# Patient Record
Sex: Female | Born: 1950
Health system: Southern US, Community
[De-identification: ages and names within clinical notes are randomized; demographics above are authoritative.]

## PROBLEM LIST (undated history)

## (undated) DIAGNOSIS — I4891 Unspecified atrial fibrillation: Secondary | ICD-10-CM

## (undated) DIAGNOSIS — R519 Headache, unspecified: Secondary | ICD-10-CM

## (undated) DIAGNOSIS — J449 Chronic obstructive pulmonary disease, unspecified: Secondary | ICD-10-CM

## (undated) DIAGNOSIS — G4730 Sleep apnea, unspecified: Secondary | ICD-10-CM

## (undated) DIAGNOSIS — I2699 Other pulmonary embolism without acute cor pulmonale: Secondary | ICD-10-CM

## (undated) DIAGNOSIS — G8929 Other chronic pain: Secondary | ICD-10-CM

## (undated) DIAGNOSIS — F419 Anxiety disorder, unspecified: Secondary | ICD-10-CM

## (undated) DIAGNOSIS — E119 Type 2 diabetes mellitus without complications: Secondary | ICD-10-CM

## (undated) DIAGNOSIS — I509 Heart failure, unspecified: Secondary | ICD-10-CM

## (undated) DIAGNOSIS — K219 Gastro-esophageal reflux disease without esophagitis: Secondary | ICD-10-CM

## (undated) DIAGNOSIS — M545 Low back pain, unspecified: Secondary | ICD-10-CM

## (undated) DIAGNOSIS — I10 Essential (primary) hypertension: Secondary | ICD-10-CM

## (undated) DIAGNOSIS — R51 Headache: Secondary | ICD-10-CM

## (undated) DIAGNOSIS — F32A Depression, unspecified: Secondary | ICD-10-CM

## (undated) DIAGNOSIS — M1990 Unspecified osteoarthritis, unspecified site: Secondary | ICD-10-CM

## (undated) DIAGNOSIS — L03116 Cellulitis of left lower limb: Secondary | ICD-10-CM

## (undated) DIAGNOSIS — I872 Venous insufficiency (chronic) (peripheral): Secondary | ICD-10-CM

## (undated) DIAGNOSIS — C50919 Malignant neoplasm of unspecified site of unspecified female breast: Secondary | ICD-10-CM

## (undated) DIAGNOSIS — F329 Major depressive disorder, single episode, unspecified: Secondary | ICD-10-CM

## (undated) DIAGNOSIS — I499 Cardiac arrhythmia, unspecified: Secondary | ICD-10-CM

## (undated) DIAGNOSIS — E785 Hyperlipidemia, unspecified: Secondary | ICD-10-CM

## (undated) DIAGNOSIS — Z86711 Personal history of pulmonary embolism: Secondary | ICD-10-CM

## (undated) DIAGNOSIS — N6002 Solitary cyst of left breast: Secondary | ICD-10-CM

## (undated) DIAGNOSIS — I82409 Acute embolism and thrombosis of unspecified deep veins of unspecified lower extremity: Secondary | ICD-10-CM

## (undated) HISTORY — DX: Acute embolism and thrombosis of unspecified deep veins of unspecified lower extremity: I82.409

## (undated) HISTORY — DX: Unspecified atrial fibrillation: I48.91

## (undated) HISTORY — PX: ABDOMINAL HYSTERECTOMY: SHX81

## (undated) HISTORY — PX: TUBAL LIGATION: SHX77

## (undated) HISTORY — PX: BREAST CYST EXCISION: SHX579

## (undated) HISTORY — PX: APPENDECTOMY: SHX54

## (undated) HISTORY — DX: Malignant neoplasm of unspecified site of unspecified female breast: C50.919

## (undated) HISTORY — DX: Other pulmonary embolism without acute cor pulmonale: I26.99

## (undated) HISTORY — PX: CHOLECYSTECTOMY: SHX55

## (undated) HISTORY — PX: BREAST LUMPECTOMY: SHX2

## (undated) HISTORY — DX: Solitary cyst of left breast: N60.02

## (undated) HISTORY — DX: Personal history of pulmonary embolism: Z86.711

## (undated) HISTORY — DX: Morbid (severe) obesity due to excess calories: E66.01

## (undated) HISTORY — PX: DILATION AND CURETTAGE OF UTERUS: SHX78

## (undated) HISTORY — PX: TONSILLECTOMY AND ADENOIDECTOMY: SUR1326

## (undated) HISTORY — PX: CARDIAC CATHETERIZATION: SHX172

---

## 1998-11-04 ENCOUNTER — Ambulatory Visit: Admission: RE | Admit: 1998-11-04 | Discharge: 1998-11-04 | Payer: Self-pay | Admitting: Occupational Medicine

## 1998-11-04 ENCOUNTER — Encounter: Payer: Self-pay | Admitting: Occupational Medicine

## 1998-12-02 ENCOUNTER — Encounter: Payer: Self-pay | Admitting: *Deleted

## 1998-12-02 ENCOUNTER — Ambulatory Visit: Admission: RE | Admit: 1998-12-02 | Discharge: 1998-12-02 | Payer: Self-pay | Admitting: *Deleted

## 1998-12-07 ENCOUNTER — Encounter: Admission: RE | Admit: 1998-12-07 | Discharge: 1998-12-22 | Payer: Self-pay | Admitting: *Deleted

## 1998-12-29 ENCOUNTER — Ambulatory Visit: Admission: RE | Admit: 1998-12-29 | Discharge: 1998-12-29 | Payer: Self-pay | Admitting: Occupational Medicine

## 1998-12-29 ENCOUNTER — Encounter: Payer: Self-pay | Admitting: Occupational Medicine

## 1999-01-06 ENCOUNTER — Ambulatory Visit (HOSPITAL_COMMUNITY): Admission: RE | Admit: 1999-01-06 | Discharge: 1999-01-06 | Payer: Self-pay | Admitting: Occupational Medicine

## 1999-01-06 ENCOUNTER — Encounter: Payer: Self-pay | Admitting: Occupational Medicine

## 1999-02-07 ENCOUNTER — Encounter: Admission: RE | Admit: 1999-02-07 | Discharge: 1999-02-18 | Payer: Self-pay | Admitting: Occupational Medicine

## 1999-10-13 ENCOUNTER — Emergency Department (HOSPITAL_COMMUNITY): Admission: EM | Admit: 1999-10-13 | Discharge: 1999-10-13 | Payer: Self-pay | Admitting: Emergency Medicine

## 1999-10-13 ENCOUNTER — Encounter: Payer: Self-pay | Admitting: Emergency Medicine

## 1999-10-14 ENCOUNTER — Encounter: Payer: Self-pay | Admitting: Emergency Medicine

## 2001-02-27 ENCOUNTER — Ambulatory Visit (HOSPITAL_COMMUNITY): Admission: RE | Admit: 2001-02-27 | Discharge: 2001-02-27 | Payer: Self-pay | Admitting: Family Medicine

## 2001-02-27 ENCOUNTER — Encounter: Payer: Self-pay | Admitting: Family Medicine

## 2013-11-24 ENCOUNTER — Emergency Department (HOSPITAL_COMMUNITY)
Admission: EM | Admit: 2013-11-24 | Discharge: 2013-11-24 | Disposition: A | Discharge status: Home / Self Care | Payer: Medicare Other | Attending: Emergency Medicine | Admitting: Emergency Medicine

## 2013-11-24 ENCOUNTER — Encounter (HOSPITAL_COMMUNITY): Payer: Self-pay | Admitting: Emergency Medicine

## 2013-11-24 DIAGNOSIS — F17200 Nicotine dependence, unspecified, uncomplicated: Secondary | ICD-10-CM | POA: Insufficient documentation

## 2013-11-24 DIAGNOSIS — M545 Low back pain, unspecified: Secondary | ICD-10-CM | POA: Insufficient documentation

## 2013-11-24 DIAGNOSIS — I10 Essential (primary) hypertension: Secondary | ICD-10-CM | POA: Insufficient documentation

## 2013-11-24 DIAGNOSIS — IMO0002 Reserved for concepts with insufficient information to code with codable children: Secondary | ICD-10-CM | POA: Diagnosis not present

## 2013-11-24 DIAGNOSIS — M5416 Radiculopathy, lumbar region: Secondary | ICD-10-CM

## 2013-11-24 DIAGNOSIS — M25559 Pain in unspecified hip: Secondary | ICD-10-CM | POA: Insufficient documentation

## 2013-11-24 HISTORY — DX: Essential (primary) hypertension: I10

## 2013-11-24 MED ORDER — DEXAMETHASONE 4 MG PO TABS: 4.0000 mg | ORAL_TABLET | Freq: Two times a day (BID) | ORAL | Status: DC

## 2013-11-24 MED ORDER — DIAZEPAM 5 MG PO TABS
5.0000 mg | ORAL_TABLET | Freq: Once | ORAL | Status: AC
  Administered 2013-11-24: 5 mg via ORAL
  Filled 2013-11-24: qty 1

## 2013-11-24 MED ORDER — HYDROMORPHONE HCL PF 1 MG/ML IJ SOLN
1.0000 mg | Freq: Once | INTRAMUSCULAR | Status: AC
  Administered 2013-11-24: 1 mg via INTRAMUSCULAR
  Filled 2013-11-24: qty 1

## 2013-11-24 MED ORDER — OXYCODONE-ACETAMINOPHEN 5-325 MG PO TABS: 1.0000 | ORAL_TABLET | ORAL | Status: DC | PRN

## 2013-11-24 MED ORDER — DEXAMETHASONE SODIUM PHOSPHATE 10 MG/ML IJ SOLN
10.0000 mg | Freq: Once | INTRAMUSCULAR | Status: AC
  Administered 2013-11-24: 10 mg via INTRAMUSCULAR
  Filled 2013-11-24: qty 1

## 2013-11-24 MED ORDER — KETOROLAC TROMETHAMINE 30 MG/ML IJ SOLN
30.0000 mg | Freq: Once | INTRAMUSCULAR | Status: AC
  Administered 2013-11-24: 30 mg via INTRAMUSCULAR
  Filled 2013-11-24: qty 1

## 2013-11-24 MED ORDER — DIAZEPAM 5 MG PO TABS: 2.5000 mg | ORAL_TABLET | Freq: Three times a day (TID) | ORAL | Status: DC | PRN

## 2013-11-24 NOTE — ED Notes (Addendum)
Pt c/o right leg pain, hip pain, lower back pain x 1 week. Pt states she needs a knee replacement. States pain is getting worse, denies injury

## 2013-11-24 NOTE — ED Notes (Signed)
Pt c/o limited movement, weakness, and swelling to RLE.  Reports that she needs a knee replacement, but her vascular MD will not release her.  Pt admits that she does not take her medications on a regular basis and will not take anything that insurance does not approve.  Denies injury.

## 2013-11-24 NOTE — Discharge Instructions (Signed)

## 2013-11-24 NOTE — ED Provider Notes (Signed)
CSN: [id number]     Arrival date & time 11/24/13  0745 History   First MD Initiated Contact with Patient 11/24/13 [phone]     Chief Complaint  Patient presents with  . Leg Pain    right  . Hip Pain  . Back Pain     (Consider location/radiation/quality/duration/timing/severity/associated sxs/prior Treatment) HPI  63 year old female with chief complaint of right lower back and right hip pain. Progressively worsening over the past 5 days. Denies any trauma. She cannot remember what she is doing specifically when she first noticed the pain. Pain is in the right lower back and radiates into her right buttocks, right thigh and down into the knee. Pain at rest which is slightly worsened with movement. No numbness or tingling. No fevers or chills. Chronic lower extremity swelling. Denies any acute change in this. No chest pain or shortness of breath. Patient has tried taking numerous medications both over-the-counter and prescription. She has tried oxycodone, NSAIDs and a lidocaine patch that her daughter gave her. She's had only mild relief with these treatments. Denies any history of any prior back or hip surgery. States she is in need of a knee replacement. Denies any IV drug use. Denies use of blood thinners. Is a smoker.  Past Medical History  Diagnosis Date  . Hypertension    Past Surgical History  Procedure Laterality Date  . Abdominal hysterectomy    . Tonsillectomy    . Appendectomy     No family history on file. History  Substance Use Topics  . Smoking status: Current Every Day Smoker  . Smokeless tobacco: Not on file  . Alcohol Use: No   OB History   Grav Para Term Preterm Abortions TAB SAB Ect Mult Living                 Review of Systems  All systems reviewed and negative, other than as noted in HPI.   Allergies  Review of patient's allergies indicates no known allergies.  Home Medications   Prior to Admission medications   Not on File   BP 170/96  Pulse 73   Temp(Src) 97.3 F (36.3 C) (Oral)  Resp 22  SpO2 96% Physical Exam  Nursing note and vitals reviewed. Constitutional: She appears well-developed and well-nourished. No distress.  Sitting up on the side of the bed. No acute distress. Morbidly obese.  HENT:  Head: Normocephalic and atraumatic.  Eyes: Conjunctivae are normal. Right eye exhibits no discharge. Left eye exhibits no discharge.  Neck: Neck supple.  Cardiovascular: Normal rate, regular rhythm and normal heart sounds.  Exam reveals no gallop and no friction rub.   No murmur heard. Pulmonary/Chest: Effort normal and breath sounds normal. No respiratory distress.  Abdominal: Soft. She exhibits no distension. There is no tenderness.  Musculoskeletal: She exhibits no edema and no tenderness.  Positive straight leg test. Tenderness with palpation along the right buttock and proximal, posterior right thigh. Sensation is intact to light touch. Increased pain with active range of motion the right hip, particularly flexion. The same movements passively with no apparent discomfort. Patient able to actively range her right knee with little apparent pain. She has a palpable DP pulse. Lower extremities are symmetric as compared to each other. No calf tenderness. Lower extremity edema with skin changes to bilateral shins consistent with stasis dermatitis. No midline spinal tenderness.  Neurological: She is alert.  Skin: Skin is warm and dry.  Psychiatric: She has a normal mood and affect. Her behavior  is normal. Thought content normal.    ED Course  Procedures (including critical care time) Labs Review Labs Reviewed - No data to display  Imaging Review No results found.   EKG Interpretation None      MDM   Final diagnoses:  Lumbar radiculopathy    63 year old female with lower back and right lower extremity pain. Suspect this is a back issue, likely lumbar radiculopathy. She is neurovascularly intact. Ranging the hip passively  elicits little pain. Same with her knee. Sensation is intact to light touch. She had a palpable DP pulse. She does have edematous with skin changes consistent with stasis dermatitis. She reports that this is chronic for her. Lytes appear to be symmetric although her body habitus does somewhat limit examination. No particularly concerning features of her symptoms or her exam. Imaging likely very low yield. Plan symptomatic treatment at this time.  Feeling much better. Walking around ED.   Virgel Manifold, MD 11/25/13 [phone]

## 2013-11-24 NOTE — ED Notes (Signed)
Pt reports that pain is relieved, except "when I push on it."  Pt asking to be discharged.  Will notify MD.

## 2014-09-16 DIAGNOSIS — M170 Bilateral primary osteoarthritis of knee: Secondary | ICD-10-CM | POA: Insufficient documentation

## 2014-09-21 DIAGNOSIS — I2699 Other pulmonary embolism without acute cor pulmonale: Secondary | ICD-10-CM

## 2014-09-21 DIAGNOSIS — I4891 Unspecified atrial fibrillation: Secondary | ICD-10-CM | POA: Insufficient documentation

## 2014-09-21 HISTORY — DX: Other pulmonary embolism without acute cor pulmonale: I26.99

## 2015-09-23 ENCOUNTER — Other Ambulatory Visit: Payer: Self-pay

## 2015-09-23 ENCOUNTER — Encounter (HOSPITAL_COMMUNITY): Payer: Self-pay

## 2015-09-23 ENCOUNTER — Emergency Department (HOSPITAL_COMMUNITY)
Admission: EM | Admit: 2015-09-23 | Discharge: 2015-09-23 | Disposition: A | Discharge status: Home / Self Care | Payer: Medicare Other | Attending: Emergency Medicine | Admitting: Emergency Medicine

## 2015-09-23 ENCOUNTER — Emergency Department (HOSPITAL_COMMUNITY): Payer: Medicare Other

## 2015-09-23 DIAGNOSIS — R0602 Shortness of breath: Secondary | ICD-10-CM

## 2015-09-23 DIAGNOSIS — F17200 Nicotine dependence, unspecified, uncomplicated: Secondary | ICD-10-CM | POA: Insufficient documentation

## 2015-09-23 DIAGNOSIS — M79605 Pain in left leg: Secondary | ICD-10-CM | POA: Diagnosis not present

## 2015-09-23 DIAGNOSIS — Z79899 Other long term (current) drug therapy: Secondary | ICD-10-CM | POA: Insufficient documentation

## 2015-09-23 DIAGNOSIS — I110 Hypertensive heart disease with heart failure: Secondary | ICD-10-CM | POA: Insufficient documentation

## 2015-09-23 DIAGNOSIS — J449 Chronic obstructive pulmonary disease, unspecified: Secondary | ICD-10-CM | POA: Insufficient documentation

## 2015-09-23 DIAGNOSIS — I509 Heart failure, unspecified: Secondary | ICD-10-CM | POA: Insufficient documentation

## 2015-09-23 DIAGNOSIS — Z86711 Personal history of pulmonary embolism: Secondary | ICD-10-CM

## 2015-09-23 DIAGNOSIS — Z8709 Personal history of other diseases of the respiratory system: Secondary | ICD-10-CM

## 2015-09-23 HISTORY — DX: Hyperlipidemia, unspecified: E78.5

## 2015-09-23 HISTORY — DX: Heart failure, unspecified: I50.9

## 2015-09-23 HISTORY — DX: Other pulmonary embolism without acute cor pulmonale: I26.99

## 2015-09-23 HISTORY — DX: Chronic obstructive pulmonary disease, unspecified: J44.9

## 2015-09-23 LAB — BASIC METABOLIC PANEL
Anion gap: 10 (ref 5–15)
BUN: 14 mg/dL (ref 6–20)
CO2: 22 mmol/L (ref 22–32)
Calcium: 9.4 mg/dL (ref 8.9–10.3)
Chloride: 105 mmol/L (ref 101–111)
Creatinine, Ser: 0.97 mg/dL (ref 0.44–1.00)
GFR calc Af Amer: >60 mL/min (ref >60)
GFR calc non Af Amer: 60 mL/min — ABNORMAL LOW (ref >60)
Glucose, Bld: 134 mg/dL — ABNORMAL HIGH (ref 65–99)
Potassium: 3.5 mmol/L (ref 3.5–5.1)
Sodium: 137 mmol/L (ref 135–145)

## 2015-09-23 LAB — CBC
HCT: 40.9 % (ref 36.0–46.0)
Hemoglobin: 14 g/dL (ref 12.0–15.0)
MCH: 28.4 pg (ref 26.0–34.0)
MCHC: 34.2 g/dL (ref 30.0–36.0)
MCV: 83 fL (ref 78.0–100.0)
Platelets: 307 10*3/uL (ref 150–400)
RBC: 4.93 MIL/uL (ref 3.87–5.11)
RDW: 13.3 % (ref 11.5–15.5)
WBC: 10.7 10*3/uL — ABNORMAL HIGH (ref 4.0–10.5)

## 2015-09-23 LAB — I-STAT TROPONIN, ED: Troponin i, poc: 0.02 ng/mL (ref 0.00–0.08)

## 2015-09-23 LAB — BRAIN NATRIURETIC PEPTIDE: B Natriuretic Peptide: 89.4 pg/mL (ref 0.0–100.0)

## 2015-09-23 MED ORDER — ALBUTEROL SULFATE (2.5 MG/3ML) 0.083% IN NEBU: 2.5000 mg | INHALATION_SOLUTION | RESPIRATORY_TRACT | Status: DC | PRN

## 2015-09-23 MED ORDER — IPRATROPIUM-ALBUTEROL 0.5-2.5 (3) MG/3ML IN SOLN
3.0000 mL | Freq: Once | RESPIRATORY_TRACT | Status: AC
  Administered 2015-09-23: 3 mL via RESPIRATORY_TRACT
  Filled 2015-09-23: qty 3

## 2015-09-23 MED ORDER — IOPAMIDOL (ISOVUE-370) INJECTION 76%
INTRAVENOUS | Status: AC
  Administered 2015-09-23: 100 mL
  Filled 2015-09-23: qty 100

## 2015-09-23 MED ORDER — SILVER SULFADIAZINE 1 % EX CREA: 1.0000 "application " | TOPICAL_CREAM | Freq: Every day | CUTANEOUS | Status: DC

## 2015-09-23 MED ORDER — FUROSEMIDE 10 MG/ML IJ SOLN
40.0000 mg | Freq: Once | INTRAMUSCULAR | Status: AC
  Administered 2015-09-23: 40 mg via INTRAVENOUS
  Filled 2015-09-23: qty 4

## 2015-09-23 MED ORDER — METHYLPREDNISOLONE SODIUM SUCC 125 MG IJ SOLR
125.0000 mg | Freq: Once | INTRAMUSCULAR | Status: AC
  Administered 2015-09-23: 125 mg via INTRAVENOUS
  Filled 2015-09-23: qty 2

## 2015-09-23 NOTE — Discharge Instructions (Signed)
Continue your medications as before.  Return to the emergency department if you develop worsening breathing, chest pain, or other new and concerning symptoms.   Shortness of Breath Shortness of breath means you have trouble breathing. It could also mean that you have a medical problem. You should get immediate medical care for shortness of breath. CAUSES   Not enough oxygen in the air such as with high altitudes or a smoke-filled room.  Certain lung diseases, infections, or problems.  Heart disease or conditions, such as angina or heart failure.  Low red blood cells (anemia).  Poor physical fitness, which can cause shortness of breath when you exercise.  Chest or back injuries or stiffness.  Being overweight.  Smoking.  Anxiety, which can make you feel like you are not getting enough air. DIAGNOSIS  Serious medical problems can often be found during your physical exam. Tests may also be done to determine why you are having shortness of breath. Tests may include:  Chest X-rays.  Lung function tests.  Blood tests.  An electrocardiogram (ECG).  An ambulatory electrocardiogram. An ambulatory ECG records your heartbeat patterns over a 24-hour period.  Exercise testing.  A transthoracic echocardiogram (TTE). During echocardiography, sound waves are used to evaluate how blood flows through your heart.  A transesophageal echocardiogram (TEE).  Imaging scans. Your health care provider may not be able to find a cause for your shortness of breath after your exam. In this case, it is important to have a follow-up exam with your health care provider as directed.  TREATMENT  Treatment for shortness of breath depends on the cause of your symptoms and can vary greatly. HOME CARE INSTRUCTIONS   Do not smoke. Smoking is a common cause of shortness of breath. If you smoke, ask for help to quit.  Avoid being around chemicals or things that may bother your breathing, such as paint  fumes and dust.  Rest as needed. Slowly resume your usual activities.  If medicines were prescribed, take them as directed for the full length of time directed. This includes oxygen and any inhaled medicines.  Keep all follow-up appointments as directed by your health care provider. SEEK MEDICAL CARE IF:   Your condition does not improve in the time expected.  You have a hard time doing your normal activities even with rest.  You have any new symptoms. SEEK IMMEDIATE MEDICAL CARE IF:   Your shortness of breath gets worse.  You feel light-headed, faint, or develop a cough not controlled with medicines.  You start coughing up blood.  You have pain with breathing.  You have chest pain or pain in your arms, shoulders, or abdomen.  You have a fever.  You are unable to walk up stairs or exercise the way you normally do. MAKE SURE YOU:  Understand these instructions.  Will watch your condition.  Will get help right away if you are not doing well or get worse.   This information is not intended to replace advice given to you by your health care provider. Make sure you discuss any questions you have with your health care provider.   Document Released: 12/13/2000 Document Revised: 03/25/2013 Document Reviewed: 06/05/2011 Elsevier Interactive Patient Education [employer].

## 2015-09-23 NOTE — ED Notes (Signed)
Pt arrives EMS with c/o shob started at 0400. No relief with inhaler this am. Duoneb in route and 125 mg iv solumedrol by EMS. C/o left leg pain with bilateral legs and abdomin swollen.Weeping edema at left leg.

## 2015-09-23 NOTE — ED Provider Notes (Signed)
CSN: [id number]     Arrival date & time 09/23/15  1023 History   First MD Initiated Contact with Patient 09/23/15 1049     Chief Complaint  Patient presents with  . Shortness of Breath     (Consider location/radiation/quality/duration/timing/severity/associated sxs/prior Treatment) HPI Comments: Patient is a 65 year old female with extensive past medical history including congestive heart failure, COPD, peripheral vascular disease, and pulmonary embolism. She presents for evaluation of shortness of breath. This started yesterday and is worsening. She denies any fevers or chills. She denies any chest pain. She does report increased swelling of her legs. She states that this feels like what she experienced with her primary pulmonary embolism, however the patient is taking Xarelto and takes this every day.  This patient is here visiting family from [place]. This is where her medical care has been provided and she has no local physician. According to the daughter, the patient will likely not return to [place] due to her worsening health condition. She is hoping she can obtain a primary doctor here.  Patient is a 66 y.o. female presenting with shortness of breath. The history is provided by the patient.  Shortness of Breath Severity:  Moderate Onset quality:  Gradual Duration:  1 day Timing:  Constant Progression:  Worsening Chronicity:  Recurrent Context: activity   Relieved by:  Nothing Worsened by:  Nothing tried Ineffective treatments:  None tried Associated symptoms: no chest pain and no fever     Past Medical History  Diagnosis Date  . Hypertension   . CHF (congestive heart failure) ([place])   . COPD (chronic obstructive pulmonary disease) ([place])   . Hyperlipidemia   . PE (pulmonary embolism)    Past Surgical History  Procedure Laterality Date  . Abdominal hysterectomy    . Tonsillectomy    . Appendectomy     History reviewed. No pertinent family history. Social History   Substance Use Topics  . Smoking status: Current Every Day Smoker  . Smokeless tobacco: None  . Alcohol Use: No   OB History    No data available     Review of Systems  Constitutional: Negative for fever.  Respiratory: Positive for shortness of breath.   Cardiovascular: Negative for chest pain.  All other systems reviewed and are negative.     Allergies  Review of patient's allergies indicates no known allergies.  Home Medications   Prior to Admission medications   Medication Sig Start Date End Date Taking? Authorizing Provider  dexamethasone (DECADRON) 4 MG tablet Take 1 tablet (4 mg total) by mouth 2 (two) times daily. 11/24/13   Virgel Manifold, MD  diazepam (VALIUM) 5 MG tablet Take 0.5 tablets (2.5 mg total) by mouth every 8 (eight) hours as needed for muscle spasms. 11/24/13   Virgel Manifold, MD  esomeprazole (NEXIUM) 40 MG capsule Take 40 mg by mouth daily at 12 noon.    Historical Provider, MD  magnesium oxide (MAG-OX) 400 MG tablet Take 400 mg by mouth daily.    Historical Provider, MD  oxyCODONE-acetaminophen (PERCOCET/ROXICET) 5-325 MG per tablet Take 1-2 tablets by mouth every 4 (four) hours as needed for moderate pain or severe pain. 11/24/13   Virgel Manifold, MD   BP 137/83 mmHg  Pulse 109  Resp 20  SpO2 97% Physical Exam  Constitutional: She is oriented to person, place, and time. She appears well-developed and well-nourished. No distress.  HENT:  Head: Normocephalic and atraumatic.  Neck: Normal range of motion. Neck supple.  Cardiovascular: Normal rate and regular rhythm.  Exam reveals no gallop and no friction rub.   No murmur heard. Pulmonary/Chest: Effort normal and breath sounds normal. No respiratory distress. She has no wheezes.  Abdominal: Soft. Bowel sounds are normal. She exhibits no distension. There is no tenderness.  Musculoskeletal: Normal range of motion. She exhibits edema.  There is 2-3+ pitting edema of both lower extremities.  Neurological:  She is alert and oriented to person, place, and time.  Skin: Skin is warm and dry. She is not diaphoretic.  Nursing note and vitals reviewed.   ED Course  Procedures (including critical care time) Labs Review Labs Reviewed  BASIC METABOLIC PANEL  CBC  BRAIN NATRIURETIC PEPTIDE  I-STAT [place], ED  I-STAT TROPOININ, ED    Imaging Review No results found. I have personally reviewed and evaluated these images and lab results as part of my medical decision-making.   EKG Interpretation   Date/Time:  Thursday September 23 2015 10:34:45 EDT Ventricular Rate:  113 PR Interval:    QRS Duration: 102 QT Interval:  352 QTC Calculation: 483 R Axis:   -43 Text Interpretation:  Sinus tachycardia Left ventricular hypertrophy  Anterior Q waves, possibly due to LVH Confirmed by Taige Housman  MD, Lourdes Manning  [id number]) on 09/23/2015 11:05:12 AM      MDM   Final diagnoses:  None    Patient is a 65 year old female with history of CHF, COPD, pulmonary embolism, and morbid obesity. She has most of her care through her doctors in [place]. She is here now with family. She initially presented with complaints of shortness of breath and leg pain. While in the emergency department she was given Lasix with a good diuresis. Her legs are now feeling markedly better and she reports breathing much better. Her oxygen saturations are 98% and she is in no respiratory distress.  I see no evidence for pneumonia, congestive heart failure, or an acute cardiac event on her workup. She tells me this is how she felt when she had a blood clot in the past. A chest CT was negative for pulmonary embolism. I see nothing that requires admission. She will be discharged with continuing her current medications. She will also need to find a local doctor here to follow-up with if she plans to be in the area for an extended period of time.    Veryl Speak, MD 09/23/15 1600

## 2015-09-23 NOTE — ED Notes (Signed)
Pt stable, ambulatory, states understanding of discharge instructions 

## 2015-12-09 ENCOUNTER — Emergency Department (HOSPITAL_COMMUNITY): Payer: Medicare Other

## 2015-12-09 ENCOUNTER — Observation Stay (HOSPITAL_COMMUNITY)
Admission: EM | Admit: 2015-12-09 | Discharge: 2015-12-10 | Disposition: A | Discharge status: Home / Self Care | Payer: Medicare Other | Attending: Internal Medicine | Admitting: Internal Medicine

## 2015-12-09 ENCOUNTER — Encounter (HOSPITAL_COMMUNITY): Payer: Self-pay | Admitting: Emergency Medicine

## 2015-12-09 DIAGNOSIS — I110 Hypertensive heart disease with heart failure: Secondary | ICD-10-CM | POA: Insufficient documentation

## 2015-12-09 DIAGNOSIS — I739 Peripheral vascular disease, unspecified: Secondary | ICD-10-CM | POA: Diagnosis not present

## 2015-12-09 DIAGNOSIS — J449 Chronic obstructive pulmonary disease, unspecified: Secondary | ICD-10-CM | POA: Diagnosis not present

## 2015-12-09 DIAGNOSIS — E785 Hyperlipidemia, unspecified: Secondary | ICD-10-CM | POA: Insufficient documentation

## 2015-12-09 DIAGNOSIS — J988 Other specified respiratory disorders: Secondary | ICD-10-CM | POA: Diagnosis present

## 2015-12-09 DIAGNOSIS — R490 Dysphonia: Secondary | ICD-10-CM | POA: Diagnosis not present

## 2015-12-09 DIAGNOSIS — I10 Essential (primary) hypertension: Secondary | ICD-10-CM

## 2015-12-09 DIAGNOSIS — F17200 Nicotine dependence, unspecified, uncomplicated: Secondary | ICD-10-CM | POA: Insufficient documentation

## 2015-12-09 DIAGNOSIS — J384 Edema of larynx: Secondary | ICD-10-CM | POA: Diagnosis not present

## 2015-12-09 DIAGNOSIS — Z7982 Long term (current) use of aspirin: Secondary | ICD-10-CM | POA: Insufficient documentation

## 2015-12-09 DIAGNOSIS — I5032 Chronic diastolic (congestive) heart failure: Secondary | ICD-10-CM | POA: Insufficient documentation

## 2015-12-09 DIAGNOSIS — Z86711 Personal history of pulmonary embolism: Secondary | ICD-10-CM | POA: Diagnosis not present

## 2015-12-09 DIAGNOSIS — Z7901 Long term (current) use of anticoagulants: Secondary | ICD-10-CM | POA: Diagnosis not present

## 2015-12-09 DIAGNOSIS — T17308A Unspecified foreign body in larynx causing other injury, initial encounter: Secondary | ICD-10-CM

## 2015-12-09 LAB — CBC WITH DIFFERENTIAL/PLATELET
Basophils Absolute: 0 10*3/uL (ref 0.0–0.1)
Basophils Relative: 0 %
Eosinophils Absolute: 0.1 10*3/uL (ref 0.0–0.7)
Eosinophils Relative: 2 %
HCT: 38.5 % (ref 36.0–46.0)
Hemoglobin: 12.9 g/dL (ref 12.0–15.0)
Lymphocytes Relative: 34 %
Lymphs Abs: 2.3 10*3/uL (ref 0.7–4.0)
MCH: 29.1 pg (ref 26.0–34.0)
MCHC: 33.5 g/dL (ref 30.0–36.0)
MCV: 86.7 fL (ref 78.0–100.0)
Monocytes Absolute: 0.5 10*3/uL (ref 0.1–1.0)
Monocytes Relative: 8 %
Neutro Abs: 3.9 10*3/uL (ref 1.7–7.7)
Neutrophils Relative %: 56 %
Platelets: 335 10*3/uL (ref 150–400)
RBC: 4.44 MIL/uL (ref 3.87–5.11)
RDW: 14.1 % (ref 11.5–15.5)
WBC: 6.8 10*3/uL (ref 4.0–10.5)

## 2015-12-09 LAB — COMPREHENSIVE METABOLIC PANEL
ALT: 12 U/L — ABNORMAL LOW (ref 14–54)
AST: 15 U/L (ref 15–41)
Albumin: 3.6 g/dL (ref 3.5–5.0)
Alkaline Phosphatase: 80 U/L (ref 38–126)
Anion gap: 8 (ref 5–15)
BUN: 11 mg/dL (ref 6–20)
CO2: 22 mmol/L (ref 22–32)
Calcium: 9.3 mg/dL (ref 8.9–10.3)
Chloride: 110 mmol/L (ref 101–111)
Creatinine, Ser: 0.78 mg/dL (ref 0.44–1.00)
GFR calc Af Amer: >60 mL/min (ref >60)
GFR calc non Af Amer: >60 mL/min (ref >60)
Glucose, Bld: 95 mg/dL (ref 65–99)
Potassium: 3.6 mmol/L (ref 3.5–5.1)
Sodium: 140 mmol/L (ref 135–145)
Total Bilirubin: 0.5 mg/dL (ref 0.3–1.2)
Total Protein: 8 g/dL (ref 6.5–8.1)

## 2015-12-09 MED ORDER — POTASSIUM CHLORIDE CRYS ER 10 MEQ PO TBCR
10.0000 meq | EXTENDED_RELEASE_TABLET | Freq: Two times a day (BID) | ORAL | Status: DC
  Administered 2015-12-10: 10 meq via ORAL
  Filled 2015-12-09 (×2): qty 1
  Filled 2015-12-09: qty 0.5

## 2015-12-09 MED ORDER — NAPROXEN 500 MG PO TABS
500.0000 mg | ORAL_TABLET | Freq: Two times a day (BID) | ORAL | Status: DC | PRN
  Filled 2015-12-09: qty 1

## 2015-12-09 MED ORDER — TIOTROPIUM BROMIDE MONOHYDRATE 18 MCG IN CAPS
18.0000 ug | ORAL_CAPSULE | Freq: Every day | RESPIRATORY_TRACT | Status: DC
  Administered 2015-12-10: 18 ug via RESPIRATORY_TRACT
  Filled 2015-12-09: qty 5

## 2015-12-09 MED ORDER — GABAPENTIN 400 MG PO CAPS
400.0000 mg | ORAL_CAPSULE | Freq: Every day | ORAL | Status: DC | PRN
  Filled 2015-12-09: qty 2

## 2015-12-09 MED ORDER — CARVEDILOL 25 MG PO TABS
25.0000 mg | ORAL_TABLET | Freq: Two times a day (BID) | ORAL | Status: DC | PRN
  Administered 2015-12-10: 25 mg via ORAL
  Filled 2015-12-09: qty 2
  Filled 2015-12-09: qty 1

## 2015-12-09 MED ORDER — BUTAMBEN-TETRACAINE-BENZOCAINE 2-2-14 % EX AERO
1.0000 | INHALATION_SPRAY | Freq: Once | CUTANEOUS | Status: DC
  Filled 2015-12-09: qty 20

## 2015-12-09 MED ORDER — DEXAMETHASONE SODIUM PHOSPHATE 10 MG/ML IJ SOLN
10.0000 mg | Freq: Once | INTRAMUSCULAR | Status: AC
  Administered 2015-12-09: 10 mg via INTRAVENOUS
  Filled 2015-12-09: qty 1

## 2015-12-09 MED ORDER — BENZOCAINE 20 % MT AERO
INHALATION_SPRAY | Freq: Once | OROMUCOSAL | Status: DC
  Filled 2015-12-09: qty 57

## 2015-12-09 MED ORDER — ALBUTEROL SULFATE (2.5 MG/3ML) 0.083% IN NEBU
2.5000 mg | INHALATION_SOLUTION | RESPIRATORY_TRACT | Status: DC | PRN
  Administered 2015-12-10: 2.5 mg via RESPIRATORY_TRACT
  Filled 2015-12-09: qty 3

## 2015-12-09 MED ORDER — RIVAROXABAN 20 MG PO TABS: 20.0000 mg | ORAL_TABLET | ORAL | Status: DC

## 2015-12-09 MED ORDER — PANTOPRAZOLE SODIUM 40 MG PO TBEC: 80.0000 mg | DELAYED_RELEASE_TABLET | Freq: Every day | ORAL | Status: DC

## 2015-12-09 MED ORDER — PRAVASTATIN SODIUM 20 MG PO TABS
20.0000 mg | ORAL_TABLET | Freq: Every day | ORAL | Status: DC
  Administered 2015-12-10: 20 mg via ORAL
  Filled 2015-12-09: qty 1

## 2015-12-09 MED ORDER — LIDOCAINE HCL 2 % EX GEL
1.0000 "application " | Freq: Once | CUTANEOUS | Status: DC
  Filled 2015-12-09: qty 20

## 2015-12-09 MED ORDER — ASPIRIN EC 81 MG PO TBEC
81.0000 mg | DELAYED_RELEASE_TABLET | Freq: Every day | ORAL | Status: DC
  Administered 2015-12-10: 81 mg via ORAL
  Filled 2015-12-09: qty 1

## 2015-12-09 MED ORDER — TORSEMIDE 20 MG PO TABS
40.0000 mg | ORAL_TABLET | Freq: Every day | ORAL | Status: DC
  Administered 2015-12-10: 40 mg via ORAL
  Filled 2015-12-09: qty 2

## 2015-12-09 MED ORDER — AMLODIPINE BESY-BENAZEPRIL HCL 10-40 MG PO CAPS: 1.0000 | ORAL_CAPSULE | Freq: Every day | ORAL | Status: DC | PRN

## 2015-12-09 MED ORDER — NORTRIPTYLINE HCL 25 MG PO CAPS
50.0000 mg | ORAL_CAPSULE | Freq: Every evening | ORAL | Status: DC | PRN
  Filled 2015-12-09: qty 2

## 2015-12-09 MED ORDER — CELECOXIB 200 MG PO CAPS
200.0000 mg | ORAL_CAPSULE | Freq: Two times a day (BID) | ORAL | Status: DC | PRN
  Filled 2015-12-09: qty 1

## 2015-12-09 MED ORDER — OXYMETAZOLINE HCL 0.05 % NA SOLN
1.0000 | Freq: Once | NASAL | Status: AC
  Administered 2015-12-09: 1 via NASAL
  Filled 2015-12-09: qty 15

## 2015-12-09 NOTE — ED Triage Notes (Signed)
Pt has raspy voice and reports she is unable to speak well and feels like she cannot breath due to popcorn being stuck in her airway.

## 2015-12-09 NOTE — ED Provider Notes (Addendum)
[facility] DEPT Provider Note   CSN: [id number] Arrival date & time: 12/09/15  1905     History   Chief Complaint Chief Complaint  Patient presents with  . Airway Obstruction    HPI Sharon Vasquez is a 65 y.o. female.  HPI 65 year old female with past medical history of hypertension, hyperlipidemia, CHF, and COPD who presents with hoarseness and difficulty breathing. The patient states that she was eating popcorn and/or chicken last night when she began coughing. She felt something "go down the wrong way" and felt short of breath. She coughed and her sx then improved. Since then, she has had worsening hoarseness and intermittent difficulty breathing. Her throat feels "scratchy" and she is having difficulty breathing intermittently. No coughing or hemoptysis.  Past Medical History:  Diagnosis Date  . CHF (congestive heart failure) ([place])   . COPD (chronic obstructive pulmonary disease) ([place])   . Hyperlipidemia   . Hypertension   . PE (pulmonary embolism)     Patient Active Problem List   Diagnosis Date Noted  . Hoarseness 12/09/2015    Past Surgical History:  Procedure Laterality Date  . ABDOMINAL HYSTERECTOMY    . APPENDECTOMY    . TONSILLECTOMY      OB History    No data available       Home Medications    Prior to Admission medications   Medication Sig Start Date End Date Taking? Authorizing Provider  albuterol (PROVENTIL) (2.5 MG/3ML) 0.083% nebulizer solution Take 3 mLs (2.5 mg total) by nebulization every 4 (four) hours as needed for wheezing or shortness of breath. 09/23/15  Yes Veryl Speak, MD  amLODipine-benazepril (LOTREL) 10-40 MG capsule Take 1 capsule by mouth daily as needed (takes occassionally).    Yes Historical Provider, MD  aspirin EC 81 MG tablet Take 81 mg by mouth daily.   Yes Historical Provider, MD  carvedilol (COREG) 25 MG tablet Take 25 mg by mouth 2 (two) times daily as needed (for BP).    Yes Historical Provider, MD  celecoxib  (CELEBREX) 200 MG capsule Take 200 mg by mouth 2 (two) times daily as needed (inflammation).   Yes Historical Provider, MD  esomeprazole (NEXIUM) 40 MG capsule Take 40 mg by mouth daily as needed (for heartburn or indigestion).   Yes Historical Provider, MD  gabapentin (NEURONTIN) 400 MG capsule Take 400-800 mg by mouth daily.    Yes Historical Provider, MD  naproxen (NAPROSYN) 500 MG tablet Take 500 mg by mouth 2 (two) times daily as needed (pain).   Yes Historical Provider, MD  nortriptyline (PAMELOR) 25 MG capsule Take 50 mg by mouth at bedtime as needed for sleep.    Yes Historical Provider, MD  potassium chloride (K-DUR,KLOR-CON) 10 MEQ tablet Take 10 mEq by mouth 2 (two) times daily.   Yes Historical Provider, MD  pravastatin (PRAVACHOL) 20 MG tablet Take 20 mg by mouth daily.   Yes Historical Provider, MD  rivaroxaban (XARELTO) 20 MG TABS tablet Take 20 mg by mouth every morning.    Yes Historical Provider, MD  silver sulfADIAZINE (SILVADENE) 1 % cream Apply 1 application topically daily. Patient taking differently: Apply 1 application topically daily as needed (for legs).  09/23/15  Yes Veryl Speak, MD  tiotropium (SPIRIVA) 18 MCG inhalation capsule Place 18 mcg into inhaler and inhale daily.   Yes Historical Provider, MD  torsemide (DEMADEX) 20 MG tablet Take 40 mg by mouth daily.    Yes Historical Provider, MD    Family History  History reviewed. No pertinent family history.  Social History Social History  Substance Use Topics  . Smoking status: Current Every Day Smoker  . Smokeless tobacco: Never Used  . Alcohol use No     Allergies   Review of patient's allergies indicates no known allergies.   Review of Systems Review of Systems  Constitutional: Negative for chills and fever.  HENT: Positive for sore throat and voice change. Negative for congestion, rhinorrhea and trouble swallowing.   Eyes: Negative for visual disturbance.  Respiratory: Negative for cough, shortness of  breath and wheezing.   Cardiovascular: Negative for chest pain and leg swelling.  Gastrointestinal: Negative for abdominal pain, diarrhea, nausea and vomiting.  Genitourinary: Negative for dysuria, flank pain, vaginal bleeding and vaginal discharge.  Musculoskeletal: Negative for neck pain and neck stiffness.  Skin: Negative for rash.  Allergic/Immunologic: Negative for immunocompromised state.  Neurological: Negative for syncope and headaches.  Hematological: Does not bruise/bleed easily.  All other systems reviewed and are negative.    Physical Exam Updated Vital Signs BP 181/95   Pulse 65   Temp 98.3 F (36.8 C) (Oral)   Resp (!) 27   SpO2 96%   Physical Exam  Constitutional: She is oriented to person, place, and time. She appears well-developed and well-nourished. No distress.  HENT:  Head: Normocephalic and atraumatic.  No lingual or lip edema. Visualized OP is patent, non-erythematous.  Eyes: Conjunctivae are normal.  Neck: Normal range of motion. Neck supple.  Minimal inspiratory stridor. No expiratory stridor. Good aeration. Hoarseness.  Cardiovascular: Normal rate, regular rhythm and normal heart sounds.  Exam reveals no friction rub.   No murmur heard. Pulmonary/Chest: Effort normal and breath sounds normal. No respiratory distress. She has no wheezes. She has no rales.  Abdominal: She exhibits no distension.  Musculoskeletal: She exhibits no edema.  Neurological: She is alert and oriented to person, place, and time. She exhibits normal muscle tone.  Skin: Skin is warm. Capillary refill takes less than 2 seconds.  Psychiatric: She has a normal mood and affect.  Nursing note and vitals reviewed.    ED Treatments / Results  Labs (all labs ordered are listed, but only abnormal results are displayed) Labs Reviewed  COMPREHENSIVE METABOLIC PANEL - Abnormal; Notable for the following:       Result Value   ALT 12 (*)    All other components within normal limits    CBC WITH DIFFERENTIAL/PLATELET    EKG  EKG Interpretation None       Radiology Dg Neck Soft Tissue  Result Date: 12/09/2015 CLINICAL DATA:  Raspy voice, unable to speak well, feels like she cannot breathe, thinks popcorn stuck in her airway since last night question foreign body; history hypertension, CHF, COPD, smoker, pulmonary embolism EXAM: NECK SOFT TISSUES - 1+ VIEW COMPARISON:  None FINDINGS: Prevertebral soft tissues normal thickness. Epiglottis and aryepiglottic folds normal thickness. Scattered laryngeal calcifications. No definite radiopaque foreign bodies identified. Multilevel endplate spur formation thoracic spine with disc space narrowing. Anterior margin of trachea at the C7 level is not well visualized due to superimposed osseous structures. IMPRESSION: No definite acute soft tissue abnormalities. Degenerative disc disease changes cervical spine Electronically Signed   By: Lavonia Dana M.D.   On: 12/09/2015 20:31   Dg Chest 2 View  Result Date: 12/09/2015 CLINICAL DATA:  Raspy voice, unable to speak well, feels like she cannot breathe, thinks popcorn is stuck in her airway since last night question foreign body ; history hypertension,  CHF, COPD, smoker, pulmonary embolism EXAM: CHEST  2 VIEW COMPARISON:  09/23/2015 FINDINGS: Enlargement of cardiac silhouette. Mediastinal contours and pulmonary vascularity normal. Lungs grossly clear. No pleural effusion or pneumothorax. Bones unremarkable. IMPRESSION: No acute abnormalities. Electronically Signed   By: Lavonia Dana M.D.   On: 12/09/2015 20:28    Procedures .Critical Care Performed by: Duffy Bruce Authorized by: Duffy Bruce   Critical care provider statement:    Critical care time (minutes):  35   Critical care time was exclusive of:  Separately billable procedures and treating other patients   Critical care was necessary to treat or prevent imminent or life-threatening deterioration of the following conditions:   Respiratory failure   Critical care was time spent personally by me on the following activities:  Development of treatment plan with patient or surrogate, discussions with consultants, ordering and performing treatments and interventions, ordering and review of laboratory studies, ordering and review of radiographic studies, pulse oximetry, re-evaluation of patient's condition, review of old charts, obtaining history from patient or surrogate, evaluation of patient's response to treatment and examination of patient   I assumed direction of critical care for this patient from another provider in my specialty: no      (including critical care time)  Transnasal Flexible Fiberoptic Laryngoscopy: Patient was anesthetized topically in the right nares with lidocaine jelly, oxymetazoline/lidocaine mixture, then the oropharynx was anesthetized with benzocaine. A flexible transnasal scope was then introduced atraumatically into the nares. The nares were widely patent with no edema. The visualized epiglottis was non-edematous. There was no visible foreign body. There was moderate edema of false cords but true cords non-edematous with normal movement, no subglotic stenosis or significant edema. No FB or swelling identified in piriform sinuses. Of note, there does appear to be superficial excoriation/ulceration to epiglottis c/w possible minor aspiration trauma.  Medications Ordered in ED Medications  lidocaine (XYLOCAINE) 2 % jelly 1 application ( Topical Canceled Entry 12/09/15 2355)  butamben-tetracaine-benzocaine (CETACAINE) spray 1 spray (1 spray Topical Not Given 12/09/15 2236)  celecoxib (CELEBREX) capsule 200 mg (not administered)  amLODipine-benazepril (LOTREL) 10-40 MG per capsule 1 capsule (not administered)  albuterol (PROVENTIL) (2.5 MG/3ML) 0.083% nebulizer solution 2.5 mg (not administered)  aspirin EC tablet 81 mg (not administered)  carvedilol (COREG) tablet 25 mg (not administered)  gabapentin  (NEURONTIN) capsule 400-800 mg (not administered)  naproxen (NAPROSYN) tablet 500 mg (not administered)  pantoprazole (PROTONIX) EC tablet 80 mg (not administered)  torsemide (DEMADEX) tablet 40 mg (not administered)  tiotropium (SPIRIVA) inhalation capsule 18 mcg (not administered)  nortriptyline (PAMELOR) capsule 50 mg (not administered)  potassium chloride SA (K-DUR,KLOR-CON) CR tablet 10 mEq (not administered)  pravastatin (PRAVACHOL) tablet 20 mg (not administered)  sodium chloride flush (NS) 0.9 % injection 3 mL (not administered)  iopamidol (ISOVUE-300) 61 % injection (not administered)  oxymetazoline (AFRIN) 0.05 % nasal spray 1 spray (1 spray Each Nare Given 12/09/15 2354)  dexamethasone (DECADRON) injection 10 mg (10 mg Intravenous Given 12/09/15 2355)     Initial Impression / Assessment and Plan / ED Course  I have reviewed the triage vital signs and the nursing notes.  Pertinent labs & imaging results that were available during my care of the patient were reviewed by me and considered in my medical decision making (see chart for details).  Clinical Course    65 yo F with PMHx of HTN, HLD, CHF, COPD who p/w hoarseness and difficulty breathing after possible aspiration event yesterday. On arrival, pt hoarse but  wth normal WOB, normal aeration, minimal coughing. No signs of respiratory distress and lungs are CTAB. Bedside TFL performed by myself shows moderate edema of false cords, superficial ulceration/abrasion to vocal cord aspect of epiglottis but normal VC movement, no apparent significant subglottic swelling, and otherwise no visible FB. Plain films of neck and CXR are clear, with no signs of air trapping or atelectasis. Discussed with Dr. Redmond Baseman of ENT. Will give decadron, plan to Obs overnight with repeat exam by Dr. Redmond Baseman in AM. Given normal WOB, no stridor, no coughing, no hypoxia currently, no indication for emergent rigid bronch att his time. Normal WBC, absence of CXR findings  reassuring as well. Pt is in agreement with this plan.  2:33 AM On repeat exam, pt comfortable, breathing with NAD. No stridor. She has persistent hoarseness of voice but is tolerating secretions, normal WOB, normal oxygenation, and is in NAD. Will continue to monitor.  Final Clinical Impressions(s) / ED Diagnoses   Final diagnoses:  Hoarseness  Choking, initial encounter  Edema of larynx    New Prescriptions New Prescriptions   No medications on file     Duffy Bruce, MD 12/10/15 1142    Duffy Bruce, MD 12/10/15 [place], MD 01/28/16 1055

## 2015-12-10 ENCOUNTER — Observation Stay (HOSPITAL_COMMUNITY): Payer: Medicare Other

## 2015-12-10 DIAGNOSIS — T17308A Unspecified foreign body in larynx causing other injury, initial encounter: Secondary | ICD-10-CM | POA: Diagnosis not present

## 2015-12-10 DIAGNOSIS — R490 Dysphonia: Secondary | ICD-10-CM | POA: Diagnosis not present

## 2015-12-10 DIAGNOSIS — I739 Peripheral vascular disease, unspecified: Secondary | ICD-10-CM | POA: Diagnosis not present

## 2015-12-10 DIAGNOSIS — I10 Essential (primary) hypertension: Secondary | ICD-10-CM

## 2015-12-10 LAB — GLUCOSE, CAPILLARY: Glucose-Capillary: 164 mg/dL — ABNORMAL HIGH (ref 65–99)

## 2015-12-10 MED ORDER — SODIUM CHLORIDE 0.9% FLUSH
3.0000 mL | Freq: Two times a day (BID) | INTRAVENOUS | Status: DC
  Administered 2015-12-10: 3 mL via INTRAVENOUS

## 2015-12-10 MED ORDER — BENAZEPRIL HCL 40 MG PO TABS
40.0000 mg | ORAL_TABLET | Freq: Every day | ORAL | Status: DC | PRN
  Administered 2015-12-10: 40 mg via ORAL
  Filled 2015-12-10: qty 1

## 2015-12-10 MED ORDER — IOPAMIDOL (ISOVUE-300) INJECTION 61%
INTRAVENOUS | Status: AC
  Administered 2015-12-10: 100 mL
  Filled 2015-12-10: qty 100

## 2015-12-10 MED ORDER — AMLODIPINE BESYLATE 10 MG PO TABS
10.0000 mg | ORAL_TABLET | Freq: Every day | ORAL | Status: DC | PRN
  Administered 2015-12-10: 10 mg via ORAL
  Filled 2015-12-10: qty 2
  Filled 2015-12-10: qty 1

## 2015-12-10 NOTE — ED Notes (Signed)
Back from b/r, steady gait, alert, NAD, calm, interactive, no dyspnea noted, (denies: pain,sob, nausea or dizziness), reports hunger, breathing easier, voice louder and stronger. LS CTA.

## 2015-12-10 NOTE — Consult Note (Signed)
Reason for Consult:Dysphonia, stridor Referring Physician: Hospitalist  Sharon Vasquez is an 65 y.o. female.  HPI: 65 year old female had a choking-like event two nights ago while eating a chicken sandwich and/or popcorn.  She felt something stick in her throat that was hard to clear.  Yesterday, she noticed some difficulty breathing with a wheezing sound.  She came to the ER with these symptoms.  She denies fever or cough.  She was admitted for observation and treated with breathing treatments and steroids.  This morning, breathing is normal and her voice has improved.  Past Medical History:  Diagnosis Date  . CHF (congestive heart failure) ([place])   . COPD (chronic obstructive pulmonary disease) ([place])   . Hyperlipidemia   . Hypertension   . PE (pulmonary embolism)     Past Surgical History:  Procedure Laterality Date  . ABDOMINAL HYSTERECTOMY    . APPENDECTOMY    . TONSILLECTOMY      History reviewed. No pertinent family history.  Social History:  reports that she has been smoking.  She has never used smokeless tobacco. She reports that she does not drink alcohol or use drugs.  Allergies: No Known Allergies  Medications: I have reviewed the patient's current medications.  Results for orders placed or performed during the hospital encounter of 12/09/15 (from the past 48 hour(s))  CBC with Differential     Status: None   Collection Time: 12/09/15  8:59 PM  Result Value Ref Range   WBC 6.8 4.0 - 10.5 K/uL   RBC 4.44 3.87 - 5.11 MIL/uL   Hemoglobin 12.9 12.0 - 15.0 g/dL   HCT 38.5 36.0 - 46.0 %   MCV 86.7 78.0 - 100.0 fL   MCH 29.1 26.0 - 34.0 pg   MCHC 33.5 30.0 - 36.0 g/dL   RDW 14.1 11.5 - 15.5 %   Platelets 335 150 - 400 K/uL   Neutrophils Relative % 56 %   Neutro Abs 3.9 1.7 - 7.7 K/uL   Lymphocytes Relative 34 %   Lymphs Abs 2.3 0.7 - 4.0 K/uL   Monocytes Relative 8 %   Monocytes Absolute 0.5 0.1 - 1.0 K/uL   Eosinophils Relative 2 %   Eosinophils Absolute 0.1 0.0  - 0.7 K/uL   Basophils Relative 0 %   Basophils Absolute 0.0 0.0 - 0.1 K/uL  Comprehensive metabolic panel     Status: Abnormal   Collection Time: 12/09/15  8:59 PM  Result Value Ref Range   Sodium 140 135 - 145 mmol/L   Potassium 3.6 3.5 - 5.1 mmol/L   Chloride 110 101 - 111 mmol/L   CO2 22 22 - 32 mmol/L   Glucose, Bld 95 65 - 99 mg/dL   BUN 11 6 - 20 mg/dL   Creatinine, Ser 0.78 0.44 - 1.00 mg/dL   Calcium 9.3 8.9 - 10.3 mg/dL   Total Protein 8.0 6.5 - 8.1 g/dL   Albumin 3.6 3.5 - 5.0 g/dL   AST 15 15 - 41 U/L   ALT 12 (L) 14 - 54 U/L   Alkaline Phosphatase 80 38 - 126 U/L   Total Bilirubin 0.5 0.3 - 1.2 mg/dL   GFR calc non Af Amer >60 >60 mL/min   GFR calc Af Amer >60 >60 mL/min    Comment: (NOTE) The eGFR has been calculated using the CKD EPI equation. This calculation has not been validated in all clinical situations. eGFR's persistently <60 mL/min signify possible Chronic Kidney Disease.  Anion gap 8 5 - 15  Glucose, capillary     Status: Abnormal   Collection Time: 12/10/15  8:25 AM  Result Value Ref Range   Glucose-Capillary 164 (H) 65 - 99 mg/dL   Comment 1 Notify RN     Dg Neck Soft Tissue  Result Date: 12/09/2015 CLINICAL DATA:  Raspy voice, unable to speak well, feels like she cannot breathe, thinks popcorn stuck in her airway since last night question foreign body; history hypertension, CHF, COPD, smoker, pulmonary embolism EXAM: NECK SOFT TISSUES - 1+ VIEW COMPARISON:  None FINDINGS: Prevertebral soft tissues normal thickness. Epiglottis and aryepiglottic folds normal thickness. Scattered laryngeal calcifications. No definite radiopaque foreign bodies identified. Multilevel endplate spur formation thoracic spine with disc space narrowing. Anterior margin of trachea at the C7 level is not well visualized due to superimposed osseous structures. IMPRESSION: No definite acute soft tissue abnormalities. Degenerative disc disease changes cervical spine Electronically  Signed   By: Lavonia Dana M.D.   On: 12/09/2015 20:31   Dg Chest 2 View  Result Date: 12/09/2015 CLINICAL DATA:  Raspy voice, unable to speak well, feels like she cannot breathe, thinks popcorn is stuck in her airway since last night question foreign body ; history hypertension, CHF, COPD, smoker, pulmonary embolism EXAM: CHEST  2 VIEW COMPARISON:  09/23/2015 FINDINGS: Enlargement of cardiac silhouette. Mediastinal contours and pulmonary vascularity normal. Lungs grossly clear. No pleural effusion or pneumothorax. Bones unremarkable. IMPRESSION: No acute abnormalities. Electronically Signed   By: Lavonia Dana M.D.   On: 12/09/2015 20:28   Ct Soft Tissue Neck W Contrast  Result Date: 12/10/2015 CLINICAL DATA:  Neck pain and hoarseness. Possible foreign body aspiration. EXAM: CT NECK AND CHEST WITH CONTRAST CONTRAST:  100 mL Isovue-300 COMPARISON:  Chest radiograph 12/09/2015 FINDINGS: CT NECK FINDINGS Pharynx and larynx: The nasopharynx is clear. The oropharynx and hypopharynx are normal. The epiglottis is normal. The supraglottic larynx, glottis and subglottic larynx are normal. Salivary glands: The parotid and submandibular glands are normal. No sialolithiasis or salivary ductal dilatation. Thyroid: The right lobe of the thyroid is enlarged. Lymph nodes: No cervical adenopathy by CT size criteria. Vascular: The major vessels of the neck are widely patent. Limited intracranial: Normal Visualized orbits: Minimally visualized Mastoids and visualized paranasal sinuses: Normal Skeleton: Multilevel advanced degenerative disc disease. No advanced bony canal stenosis. CT CHEST FINDINGS Mediastinum/Lymph Nodes: There is atherosclerotic calcification within the aorta and coronary arteries. Heart size within normal limits. No pericardial effusion. No central pulmonary embolus. No mediastinal or axillary lymphadenopathy. Lungs/Pleura: The trachea and proximal airways are patent. There is no foreign body identified within  the airway. No pulmonary nodules or masses. No focal airspace consolidation. No pleural effusion. Likely mild pulmonary edema. Upper abdomen: There is hepatosplenomegaly. Otherwise, the visualized upper abdominal organs are normal. Musculoskeletal: No lytic or blastic lesions. IMPRESSION: 1. No foreign body identified within the airway. 2. No retropharyngeal or peritonsillar abscess. Mild thickening of the palatine tonsils may indicate a degree of pharyngitis. 3. Mild pulmonary edema. 4. Aortic and coronary artery atherosclerosis. Electronically Signed   By: Ulyses Jarred M.D.   On: 12/10/2015 03:08   Ct Chest W Contrast  Result Date: 12/10/2015 CLINICAL DATA:  Neck pain and hoarseness. Possible foreign body aspiration. EXAM: CT NECK AND CHEST WITH CONTRAST CONTRAST:  100 mL Isovue-300 COMPARISON:  Chest radiograph 12/09/2015 FINDINGS: CT NECK FINDINGS Pharynx and larynx: The nasopharynx is clear. The oropharynx and hypopharynx are normal. The epiglottis is normal. The supraglottic  larynx, glottis and subglottic larynx are normal. Salivary glands: The parotid and submandibular glands are normal. No sialolithiasis or salivary ductal dilatation. Thyroid: The right lobe of the thyroid is enlarged. Lymph nodes: No cervical adenopathy by CT size criteria. Vascular: The major vessels of the neck are widely patent. Limited intracranial: Normal Visualized orbits: Minimally visualized Mastoids and visualized paranasal sinuses: Normal Skeleton: Multilevel advanced degenerative disc disease. No advanced bony canal stenosis. CT CHEST FINDINGS Mediastinum/Lymph Nodes: There is atherosclerotic calcification within the aorta and coronary arteries. Heart size within normal limits. No pericardial effusion. No central pulmonary embolus. No mediastinal or axillary lymphadenopathy. Lungs/Pleura: The trachea and proximal airways are patent. There is no foreign body identified within the airway. No pulmonary nodules or masses. No  focal airspace consolidation. No pleural effusion. Likely mild pulmonary edema. Upper abdomen: There is hepatosplenomegaly. Otherwise, the visualized upper abdominal organs are normal. Musculoskeletal: No lytic or blastic lesions. IMPRESSION: 1. No foreign body identified within the airway. 2. No retropharyngeal or peritonsillar abscess. Mild thickening of the palatine tonsils may indicate a degree of pharyngitis. 3. Mild pulmonary edema. 4. Aortic and coronary artery atherosclerosis. Electronically Signed   By: Ulyses Jarred M.D.   On: 12/10/2015 03:08    Review of Systems  All other systems reviewed and are negative.  Blood pressure (!) 150/92, pulse 67, temperature 97.5 F (36.4 C), temperature source Oral, resp. rate 17, SpO2 99 %. Physical Exam  Constitutional: She is oriented to person, place, and time. She appears well-developed and well-nourished. No distress.  HENT:  Head: Normocephalic and atraumatic.  Right Ear: External ear normal.  Left Ear: External ear normal.  Nose: Nose normal.  Mouth/Throat: Oropharynx is clear and moist.  Mildly hoarse, intermittent.  Eyes: Conjunctivae and EOM are normal. Pupils are equal, round, and reactive to light.  Neck: Normal range of motion. Neck supple.  Cardiovascular: Normal rate.   Respiratory: Effort normal.  No stridor.  Musculoskeletal: Normal range of motion.  Neurological: She is alert and oriented to person, place, and time. No cranial nerve deficit.  Skin: Skin is warm and dry.  Psychiatric: She has a normal mood and affect. Her behavior is normal. Judgment and thought content normal.    Assessment/Plan: Dysphonia and stridor Normal neck and chest x-rays noted.  It seems that she had laryngeal swelling due to a choking-like event.  I doubt that she aspirated significant material with the chest x-ray clear after some delay.  Symptoms have improved nicely with steroid therapy.  I feel she can be discharged to advance her diet  gradually and follow-up with me in one week.  Danett Palazzo 12/10/2015, 9:43 AM

## 2015-12-10 NOTE — Discharge Summary (Addendum)
Physician Discharge Summary  Sharon Vasquez [id number] DOB: [birth year] DOA: 12/09/2015  PCP: No primary care provider on file.  Admit date: 12/09/2015 Discharge date: 12/10/2015  Time spent: 30 minutes  Recommendations for Outpatient Follow-up:  1. Repeat BMET to follow electrolytes and renal function    Discharge Diagnoses:  Principal Problem:   Hoarseness Active Problems:   Choking   Morbid obesity due to excess calories (HCC)   Peripheral vascular disease ([place])   Benign essential HTN   Discharge Condition: stable and improved. Discharge home with instructions to follow up with Dr. Para Skeans in 1 wweek.  Diet recommendation: low sodium and low calorie diet   History of present illness:  65 y.o. female with medical history significant of  hypertension,hyperlipidemia,CHF,and COPD who presents with hoarseness and difficulty breathing. The patient states that she was eating popcorn and/or chicken last night (eating both at same time, not sure which she choked on) when she began coughing. She felt something "go down the wrong way" and felt short of breath. She coughed and her sx then improved. Since then, she has had worsening hoarseness and intermittent difficulty breathing. Her throat feels "scratchy" and she is having difficulty breathing intermittently. No ongoing hemoptysis.  Hospital Course:  1-Hoarseness and SOB: -related to choking episode -excellent response to use of decadron  -CXR and CT w/o retained foreign body and/or PNA -ok to advance diet and medications -follow up with Dr. Redmond Baseman in 1 week  2-COPD -continue home inhaler therapy -good O2 sat on RA -no wheezing  3-Essential HTN -fairly well controlled -will continue antihypertensive regimen -advise to follow low sodium diet  4-obesity -advise to follow low calorie diet and increase activity/exercise  5-chronic diastolic heart failure (according to patient) -no echo available to confirmed EF -will continue  b-blocker, benazepril, demadex and low sodium diet  -advise to check weight on daily basis   6-Hx of PE -continue xarelto  Procedures:  See below for x-ray reports   Consultations:  ENT  Discharge Exam: Vitals:   12/10/15 0820 12/10/15 0831  BP: (!) 150/92   Pulse: 67   Resp: 17   Temp:  97.5 F (36.4 C)    General: afebrile, still hoarse, no stridor, no wheezing and with good O2 sat on RA. Cardiovascular: S1 and S2, no rubs, no gallops Respiratory: good air movement, no wheezing, no crackles  Abd: obese, NT, ND, positive BS Extremities: LE edema 2+ bilaterally, no cyanosis   Discharge Instructions   Discharge Instructions    Diet - low sodium heart healthy    Complete by:  As directed   Discharge instructions    Complete by:  As directed   Keep yourself well hydrated Take medications as prescribed  Follow up with PCP in 2 weeks Follow up with Dr. Redmond Baseman in 1 week Please rest your voice as much as possible and make sure you advance your diet slowly, chew properly, not to big food boluses and eating in upright position.     Current Discharge Medication List    CONTINUE these medications which have NOT CHANGED   Details  albuterol (PROVENTIL) (2.5 MG/3ML) 0.083% nebulizer solution Take 3 mLs (2.5 mg total) by nebulization every 4 (four) hours as needed for wheezing or shortness of breath. Qty: 30 vial, Refills: 1    amLODipine-benazepril (LOTREL) 10-40 MG capsule Take 1 capsule by mouth daily as needed (takes occassionally).     aspirin EC 81 MG tablet Take 81 mg by mouth daily.  carvedilol (COREG) 25 MG tablet Take 25 mg by mouth 2 (two) times daily as needed (for BP).     celecoxib (CELEBREX) 200 MG capsule Take 200 mg by mouth 2 (two) times daily as needed (inflammation).    esomeprazole (NEXIUM) 40 MG capsule Take 40 mg by mouth daily as needed (for heartburn or indigestion).    gabapentin (NEURONTIN) 400 MG capsule Take 400-800 mg by mouth daily.      naproxen (NAPROSYN) 500 MG tablet Take 500 mg by mouth 2 (two) times daily as needed (pain).    nortriptyline (PAMELOR) 25 MG capsule Take 50 mg by mouth at bedtime as needed for sleep.     potassium chloride (K-DUR,KLOR-CON) 10 MEQ tablet Take 10 mEq by mouth 2 (two) times daily.    pravastatin (PRAVACHOL) 20 MG tablet Take 20 mg by mouth daily.    rivaroxaban (XARELTO) 20 MG TABS tablet Take 20 mg by mouth every morning.     silver sulfADIAZINE (SILVADENE) 1 % cream Apply 1 application topically daily. Qty: 50 g, Refills: 1    tiotropium (SPIRIVA) 18 MCG inhalation capsule Place 18 mcg into inhaler and inhale daily.    torsemide (DEMADEX) 20 MG tablet Take 40 mg by mouth daily.        No Known Allergies Follow-up Information    BATES, DWIGHT, MD. Schedule an appointment as soon as possible for a visit in 1 week(s).   Specialty:  Otolaryngology Contact information: [address] [place] [address] [phone]        Melida Quitter, MD .   Specialty:  Otolaryngology Contact information: [address] [place]  [address] [phone]           The results of significant diagnostics from this hospitalization (including imaging, microbiology, ancillary and laboratory) are listed below for reference.    Significant Diagnostic Studies: Dg Neck Soft Tissue  Result Date: 12/09/2015 CLINICAL DATA:  Raspy voice, unable to speak well, feels like she cannot breathe, thinks popcorn stuck in her airway since last night question foreign body; history hypertension, CHF, COPD, smoker, pulmonary embolism EXAM: NECK SOFT TISSUES - 1+ VIEW COMPARISON:  None FINDINGS: Prevertebral soft tissues normal thickness. Epiglottis and aryepiglottic folds normal thickness. Scattered laryngeal calcifications. No definite radiopaque foreign bodies identified. Multilevel endplate spur formation thoracic spine with disc space narrowing. Anterior margin of trachea  at the C7 level is not well visualized due to superimposed osseous structures. IMPRESSION: No definite acute soft tissue abnormalities. Degenerative disc disease changes cervical spine Electronically Signed   By: Lavonia Dana M.D.   On: 12/09/2015 20:31   Dg Chest 2 View  Result Date: 12/09/2015 CLINICAL DATA:  Raspy voice, unable to speak well, feels like she cannot breathe, thinks popcorn is stuck in her airway since last night question foreign body ; history hypertension, CHF, COPD, smoker, pulmonary embolism EXAM: CHEST  2 VIEW COMPARISON:  09/23/2015 FINDINGS: Enlargement of cardiac silhouette. Mediastinal contours and pulmonary vascularity normal. Lungs grossly clear. No pleural effusion or pneumothorax. Bones unremarkable. IMPRESSION: No acute abnormalities. Electronically Signed   By: Lavonia Dana M.D.   On: 12/09/2015 20:28   Ct Soft Tissue Neck W Contrast  Result Date: 12/10/2015 CLINICAL DATA:  Neck pain and hoarseness. Possible foreign body aspiration. EXAM: CT NECK AND CHEST WITH CONTRAST CONTRAST:  100 mL Isovue-300 COMPARISON:  Chest radiograph 12/09/2015 FINDINGS: CT NECK FINDINGS Pharynx and larynx: The nasopharynx is clear. The oropharynx and hypopharynx are  normal. The epiglottis is normal. The supraglottic larynx, glottis and subglottic larynx are normal. Salivary glands: The parotid and submandibular glands are normal. No sialolithiasis or salivary ductal dilatation. Thyroid: The right lobe of the thyroid is enlarged. Lymph nodes: No cervical adenopathy by CT size criteria. Vascular: The major vessels of the neck are widely patent. Limited intracranial: Normal Visualized orbits: Minimally visualized Mastoids and visualized paranasal sinuses: Normal Skeleton: Multilevel advanced degenerative disc disease. No advanced bony canal stenosis. CT CHEST FINDINGS Mediastinum/Lymph Nodes: There is atherosclerotic calcification within the aorta and coronary arteries. Heart size within normal limits. No  pericardial effusion. No central pulmonary embolus. No mediastinal or axillary lymphadenopathy. Lungs/Pleura: The trachea and proximal airways are patent. There is no foreign body identified within the airway. No pulmonary nodules or masses. No focal airspace consolidation. No pleural effusion. Likely mild pulmonary edema. Upper abdomen: There is hepatosplenomegaly. Otherwise, the visualized upper abdominal organs are normal. Musculoskeletal: No lytic or blastic lesions. IMPRESSION: 1. No foreign body identified within the airway. 2. No retropharyngeal or peritonsillar abscess. Mild thickening of the palatine tonsils may indicate a degree of pharyngitis. 3. Mild pulmonary edema. 4. Aortic and coronary artery atherosclerosis. Electronically Signed   By: Ulyses Jarred M.D.   On: 12/10/2015 03:08   Ct Chest W Contrast  Result Date: 12/10/2015 CLINICAL DATA:  Neck pain and hoarseness. Possible foreign body aspiration. EXAM: CT NECK AND CHEST WITH CONTRAST CONTRAST:  100 mL Isovue-300 COMPARISON:  Chest radiograph 12/09/2015 FINDINGS: CT NECK FINDINGS Pharynx and larynx: The nasopharynx is clear. The oropharynx and hypopharynx are normal. The epiglottis is normal. The supraglottic larynx, glottis and subglottic larynx are normal. Salivary glands: The parotid and submandibular glands are normal. No sialolithiasis or salivary ductal dilatation. Thyroid: The right lobe of the thyroid is enlarged. Lymph nodes: No cervical adenopathy by CT size criteria. Vascular: The major vessels of the neck are widely patent. Limited intracranial: Normal Visualized orbits: Minimally visualized Mastoids and visualized paranasal sinuses: Normal Skeleton: Multilevel advanced degenerative disc disease. No advanced bony canal stenosis. CT CHEST FINDINGS Mediastinum/Lymph Nodes: There is atherosclerotic calcification within the aorta and coronary arteries. Heart size within normal limits. No pericardial effusion. No central pulmonary embolus.  No mediastinal or axillary lymphadenopathy. Lungs/Pleura: The trachea and proximal airways are patent. There is no foreign body identified within the airway. No pulmonary nodules or masses. No focal airspace consolidation. No pleural effusion. Likely mild pulmonary edema. Upper abdomen: There is hepatosplenomegaly. Otherwise, the visualized upper abdominal organs are normal. Musculoskeletal: No lytic or blastic lesions. IMPRESSION: 1. No foreign body identified within the airway. 2. No retropharyngeal or peritonsillar abscess. Mild thickening of the palatine tonsils may indicate a degree of pharyngitis. 3. Mild pulmonary edema. 4. Aortic and coronary artery atherosclerosis. Electronically Signed   By: Ulyses Jarred M.D.   On: 12/10/2015 03:08    Microbiology: No results found for this or any previous visit (from the past 240 hour(s)).   Labs: Basic Metabolic Panel:  Recent Labs Lab 12/09/15 2059  NA 140  K 3.6  CL 110  CO2 22  GLUCOSE 95  BUN 11  CREATININE 0.78  CALCIUM 9.3   Liver Function Tests:  Recent Labs Lab 12/09/15 2059  AST 15  ALT 12*  ALKPHOS 80  BILITOT 0.5  PROT 8.0  ALBUMIN 3.6   CBC:  Recent Labs Lab 12/09/15 2059  WBC 6.8  NEUTROABS 3.9  HGB 12.9  HCT 38.5  MCV 86.7  PLT 335  BNP (last 3 results)  Recent Labs  09/23/15 1045  BNP 89.4   CBG:  Recent Labs Lab 12/10/15 0825  GLUCAP 164*    Signed:  Barton Dubois MD.  [facility] 12/10/2015, 11:12 AM

## 2015-12-10 NOTE — ED Notes (Signed)
Pharm called for KCL

## 2015-12-10 NOTE — H&P (Signed)
History and Physical    Sharon Vasquez [id number] DOB: 1951/04/03 DOA: 12/09/2015   PCP: No primary care provider on file. Chief Complaint:  Chief Complaint  Patient presents with  . Airway Obstruction    HPI: Sharon Vasquez is a 65 y.o. female with medical history significant of  hypertension, hyperlipidemia, CHF, and COPD who presents with hoarseness and difficulty breathing. The patient states that she was eating popcorn and/or chicken last night (eating both at same time, not sure which she choked on) when she began coughing. She felt something "go down the wrong way" and felt short of breath. She coughed and her sx then improved. Since then, she has had worsening hoarseness and intermittent difficulty breathing. Her throat feels "scratchy" and she is having difficulty breathing intermittently. No ongoing coughing or hemoptysis.    ED Course: Sats 100%, CXR negative, x-ray neck negative.  Bedside laryngoscopy shows no observable foreign body, false cord edema, but widely patent airway per EDP.  Review of Systems: As per HPI otherwise 10 point review of systems negative.    Past Medical History:  Diagnosis Date  . CHF (congestive heart failure) ([place])   . COPD (chronic obstructive pulmonary disease) ([place])   . Hyperlipidemia   . Hypertension   . PE (pulmonary embolism)     Past Surgical History:  Procedure Laterality Date  . ABDOMINAL HYSTERECTOMY    . APPENDECTOMY    . TONSILLECTOMY       reports that she has been smoking.  She has never used smokeless tobacco. She reports that she does not drink alcohol or use drugs.  No Known Allergies  History reviewed. No pertinent family history.    Prior to Admission medications   Medication Sig Start Date End Date Taking? Authorizing Provider  albuterol (PROVENTIL) (2.5 MG/3ML) 0.083% nebulizer solution Take 3 mLs (2.5 mg total) by nebulization every 4 (four) hours as needed for wheezing or shortness of breath. 09/23/15  Yes  Veryl Speak, MD  amLODipine-benazepril (LOTREL) 10-40 MG capsule Take 1 capsule by mouth daily as needed (takes occassionally).    Yes Historical Provider, MD  aspirin EC 81 MG tablet Take 81 mg by mouth daily.   Yes Historical Provider, MD  carvedilol (COREG) 25 MG tablet Take 25 mg by mouth 2 (two) times daily as needed (for BP).    Yes Historical Provider, MD  celecoxib (CELEBREX) 200 MG capsule Take 200 mg by mouth 2 (two) times daily as needed (inflammation).   Yes Historical Provider, MD  esomeprazole (NEXIUM) 40 MG capsule Take 40 mg by mouth daily as needed (for heartburn or indigestion).   Yes Historical Provider, MD  gabapentin (NEURONTIN) 400 MG capsule Take 400-800 mg by mouth daily.    Yes Historical Provider, MD  naproxen (NAPROSYN) 500 MG tablet Take 500 mg by mouth 2 (two) times daily as needed (pain).   Yes Historical Provider, MD  nortriptyline (PAMELOR) 25 MG capsule Take 50 mg by mouth at bedtime as needed for sleep.    Yes Historical Provider, MD  potassium chloride (K-DUR,KLOR-CON) 10 MEQ tablet Take 10 mEq by mouth 2 (two) times daily.   Yes Historical Provider, MD  pravastatin (PRAVACHOL) 20 MG tablet Take 20 mg by mouth daily.   Yes Historical Provider, MD  rivaroxaban (XARELTO) 20 MG TABS tablet Take 20 mg by mouth every morning.    Yes Historical Provider, MD  silver sulfADIAZINE (SILVADENE) 1 % cream Apply 1 application topically daily. Patient taking differently: Apply  1 application topically daily as needed (for legs).  09/23/15  Yes Veryl Speak, MD  tiotropium (SPIRIVA) 18 MCG inhalation capsule Place 18 mcg into inhaler and inhale daily.   Yes Historical Provider, MD  torsemide (DEMADEX) 20 MG tablet Take 40 mg by mouth daily.    Yes Historical Provider, MD    Physical Exam: Vitals:   12/09/15 2145 12/09/15 2200 12/09/15 2215 12/09/15 2230  BP:      Pulse: 70 68 73 65  Resp: 14 19 (!) 27 (!) 27  Temp:      TempSrc:      SpO2: 97% 98% 95% 96%       Constitutional: NAD, calm, comfortable Eyes: PERRL, lids and conjunctivae normal ENMT: Mucous membranes are moist. Posterior pharynx clear of any exudate or lesions.Normal dentition.  Neck: normal, supple, no masses, no thyromegaly, very hoarse voice Respiratory: clear to auscultation bilaterally, no wheezing, no crackles. Normal respiratory effort. No accessory muscle use.  Cardiovascular: Regular rate and rhythm, no murmurs / rubs / gallops. No extremity edema. 2+ pedal pulses. No carotid bruits.  Abdomen: no tenderness, no masses palpated. No hepatosplenomegaly. Bowel sounds positive.  Musculoskeletal: no clubbing / cyanosis. No joint deformity upper and lower extremities. Good ROM, no contractures. Normal muscle tone.  Skin: no rashes, lesions, ulcers. No induration Neurologic: CN 2-12 grossly intact. Sensation intact, DTR normal. Strength 5/5 in all 4.  Psychiatric: Normal judgment and insight. Alert and oriented x 3. Normal mood.    Labs on Admission: I have personally reviewed following labs and imaging studies  CBC:  Recent Labs Lab 12/09/15 2059  WBC 6.8  NEUTROABS 3.9  HGB 12.9  HCT 38.5  MCV 86.7  PLT 123456   Basic Metabolic Panel:  Recent Labs Lab 12/09/15 2059  NA 140  K 3.6  CL 110  CO2 22  GLUCOSE 95  BUN 11  CREATININE 0.78  CALCIUM 9.3   GFR: CrCl cannot be calculated (Unknown ideal weight.). Liver Function Tests:  Recent Labs Lab 12/09/15 2059  AST 15  ALT 12*  ALKPHOS 80  BILITOT 0.5  PROT 8.0  ALBUMIN 3.6   No results for input(s): LIPASE, AMYLASE in the last 168 hours. No results for input(s): AMMONIA in the last 168 hours. Coagulation Profile: No results for input(s): INR, PROTIME in the last 168 hours. Cardiac Enzymes: No results for input(s): CKTOTAL, CKMB, CKMBINDEX, TROPONINI in the last 168 hours. BNP (last 3 results) No results for input(s): PROBNP in the last 8760 hours. HbA1C: No results for input(s): HGBA1C in the  last 72 hours. CBG: No results for input(s): GLUCAP in the last 168 hours. Lipid Profile: No results for input(s): CHOL, HDL, LDLCALC, TRIG, CHOLHDL, LDLDIRECT in the last 72 hours. Thyroid Function Tests: No results for input(s): TSH, T4TOTAL, FREET4, T3FREE, THYROIDAB in the last 72 hours. Anemia Panel: No results for input(s): VITAMINB12, FOLATE, FERRITIN, TIBC, IRON, RETICCTPCT in the last 72 hours. Urine analysis: No results found for: COLORURINE, APPEARANCEUR, LABSPEC, PHURINE, GLUCOSEU, HGBUR, BILIRUBINUR, KETONESUR, PROTEINUR, UROBILINOGEN, NITRITE, LEUKOCYTESUR Sepsis Labs: @LABRCNTIP (procalcitonin:4,lacticidven:4) )No results found for this or any previous visit (from the past 240 hour(s)).   Radiological Exams on Admission: Dg Neck Soft Tissue  Result Date: 12/09/2015 CLINICAL DATA:  Raspy voice, unable to speak well, feels like she cannot breathe, thinks popcorn stuck in her airway since last night question foreign body; history hypertension, CHF, COPD, smoker, pulmonary embolism EXAM: NECK SOFT TISSUES - 1+ VIEW COMPARISON:  None FINDINGS: Prevertebral  soft tissues normal thickness. Epiglottis and aryepiglottic folds normal thickness. Scattered laryngeal calcifications. No definite radiopaque foreign bodies identified. Multilevel endplate spur formation thoracic spine with disc space narrowing. Anterior margin of trachea at the C7 level is not well visualized due to superimposed osseous structures. IMPRESSION: No definite acute soft tissue abnormalities. Degenerative disc disease changes cervical spine Electronically Signed   By: Lavonia Dana M.D.   On: 12/09/2015 20:31   Dg Chest 2 View  Result Date: 12/09/2015 CLINICAL DATA:  Raspy voice, unable to speak well, feels like she cannot breathe, thinks popcorn is stuck in her airway since last night question foreign body ; history hypertension, CHF, COPD, smoker, pulmonary embolism EXAM: CHEST  2 VIEW COMPARISON:  09/23/2015 FINDINGS:  Enlargement of cardiac silhouette. Mediastinal contours and pulmonary vascularity normal. Lungs grossly clear. No pleural effusion or pneumothorax. Bones unremarkable. IMPRESSION: No acute abnormalities. Electronically Signed   By: Lavonia Dana M.D.   On: 12/09/2015 20:28    EKG: Independently reviewed.  Assessment/Plan Principal Problem:   Hoarseness    1. Hoarseness due to aspiration - 1. Patient got dose of decadron 10mg  IV x1 in ED 2. Getting CT neck and chest to look at how much airway occlusion there actually is to guide further therapy.  Also CT chest to look for atelectasis potentially due to retained foreign body. 3. Will monitor overnight 4. Dr. Redmond Baseman to see in AM and decide on next step 5. Will keep patient NPO 6. Will hold her Xarelto for tomorrow just in case (h/o recurrent PE but last PE looks to be in June of 2016). 2. H/o Recurrent PE - see above, holding Xarelto 3. H/o CHF - continue home meds   DVT prophylaxis: SCDs (and technically should be therapeutic on Xarelto through tomorrow AM). Code Status: Full Family Communication: No family in room Consults called: EDP spoke with Dr. Redmond Baseman who will see patient in AM Admission status: Place in obs   Laverda Stribling, [facility] Pager [phone] from 7PM-7AM  If 7AM-7PM, please contact the day physician for the patient [email or web address] Password [email or web address]  12/10/2015, 12:12 AM

## 2015-12-10 NOTE — Care Management Note (Addendum)
Case Management Note  Patient Details  Name: Sharon Vasquez [name]: [id number] Date of Birth: [birth year]  Subjective/Objective:     Pt is from home [facility]) alone and states she is independent with the exception of a rollator to help with mobility.  Has one daughter in [place], one in [place], [place] family in [place].  Pt stated her primary residence is in [place] - however she has also established a residence here (spends approximately 6 months a year in each location).  She has PCP and vascular specialist in [place] - pt denied request from CM to assist with finding PCP in [place] area - stated "not at this time, if I need a doctor I just get in my car and go to [place], they know me and I dont feel comfortable with anyone else.  Pt denied barrier with getting needed prescriptions, stated "I call [place] tell them what I need and they send it to either [facility] or [facility] here and I go pick it up , no problem."  CM informed pt of dangers in long rides (hx of vascular issues and clots) - pt acknowledged and stated she props her feet up on the train ride.  Pt later stated that if she had a need that she couldn't wait for [place] she is semi established with Dr Marton Redwood in [place].       Action/Plan:   Expected Discharge Date:                  Expected Discharge Plan:  Home/Self Care  In-House Referral:     Discharge planning Services  CM Consult  Post Acute Care Choice:    Choice offered to:     DME Arranged:    DME Agency:     HH Arranged:    HH Agency:     Status of Service:  Completed, signed off  If discussed at [employer] of Stay Meetings, dates discussed:    Additional Comments: Pt will discharge home, daughter will transport pt back to home.  Prescriptions remain unchanged from prior to admit.  No CM needs determined Maryclare Labrador, RN 12/10/2015, 11:35 AM

## 2015-12-10 NOTE — ED Notes (Signed)
Patient transported to CT at this time via ED stretcher. Pt in no apparent distress at this time.   

## 2015-12-10 NOTE — Care Management Obs Status (Signed)
[facility] NOTIFICATION   Patient Details  Name: Sharon Vasquez [name]: [id number] Date of Birth: May 27, 1950   Medicare Observation Status Notification Given:       Maryclare Labrador, RN 12/10/2015, 11:40 AM

## 2015-12-16 DIAGNOSIS — R490 Dysphonia: Secondary | ICD-10-CM | POA: Insufficient documentation

## 2015-12-18 DIAGNOSIS — J381 Polyp of vocal cord and larynx: Secondary | ICD-10-CM

## 2015-12-18 DIAGNOSIS — K219 Gastro-esophageal reflux disease without esophagitis: Secondary | ICD-10-CM | POA: Insufficient documentation

## 2015-12-18 HISTORY — DX: Gastro-esophageal reflux disease without esophagitis: K21.9

## 2015-12-18 HISTORY — DX: Polyp of vocal cord and larynx: J38.1

## 2016-07-11 ENCOUNTER — Observation Stay (HOSPITAL_COMMUNITY)
Admission: EM | Admit: 2016-07-11 | Discharge: 2016-07-13 | Disposition: A | Discharge status: Home / Self Care | Payer: Medicare Other | Attending: Internal Medicine | Admitting: Internal Medicine

## 2016-07-11 ENCOUNTER — Emergency Department (HOSPITAL_COMMUNITY): Payer: Medicare Other

## 2016-07-11 ENCOUNTER — Encounter (HOSPITAL_COMMUNITY): Payer: Self-pay

## 2016-07-11 DIAGNOSIS — I83028 Varicose veins of left lower extremity with ulcer other part of lower leg: Secondary | ICD-10-CM | POA: Diagnosis not present

## 2016-07-11 DIAGNOSIS — Z79899 Other long term (current) drug therapy: Secondary | ICD-10-CM | POA: Insufficient documentation

## 2016-07-11 DIAGNOSIS — I739 Peripheral vascular disease, unspecified: Secondary | ICD-10-CM | POA: Diagnosis not present

## 2016-07-11 DIAGNOSIS — I482 Chronic atrial fibrillation: Secondary | ICD-10-CM | POA: Insufficient documentation

## 2016-07-11 DIAGNOSIS — J449 Chronic obstructive pulmonary disease, unspecified: Secondary | ICD-10-CM | POA: Diagnosis not present

## 2016-07-11 DIAGNOSIS — L03116 Cellulitis of left lower limb: Secondary | ICD-10-CM | POA: Diagnosis not present

## 2016-07-11 DIAGNOSIS — Z720 Tobacco use: Secondary | ICD-10-CM | POA: Diagnosis present

## 2016-07-11 DIAGNOSIS — Z86711 Personal history of pulmonary embolism: Secondary | ICD-10-CM | POA: Insufficient documentation

## 2016-07-11 DIAGNOSIS — R6510 Systemic inflammatory response syndrome (SIRS) of non-infectious origin without acute organ dysfunction: Secondary | ICD-10-CM | POA: Diagnosis not present

## 2016-07-11 DIAGNOSIS — R7989 Other specified abnormal findings of blood chemistry: Secondary | ICD-10-CM

## 2016-07-11 DIAGNOSIS — I890 Lymphedema, not elsewhere classified: Secondary | ICD-10-CM | POA: Insufficient documentation

## 2016-07-11 DIAGNOSIS — L0390 Cellulitis, unspecified: Secondary | ICD-10-CM | POA: Diagnosis present

## 2016-07-11 DIAGNOSIS — I110 Hypertensive heart disease with heart failure: Secondary | ICD-10-CM | POA: Insufficient documentation

## 2016-07-11 DIAGNOSIS — R197 Diarrhea, unspecified: Secondary | ICD-10-CM | POA: Diagnosis not present

## 2016-07-11 DIAGNOSIS — Z6839 Body mass index (BMI) 39.0-39.9, adult: Secondary | ICD-10-CM | POA: Diagnosis not present

## 2016-07-11 DIAGNOSIS — J441 Chronic obstructive pulmonary disease with (acute) exacerbation: Secondary | ICD-10-CM

## 2016-07-11 DIAGNOSIS — R778 Other specified abnormalities of plasma proteins: Secondary | ICD-10-CM | POA: Diagnosis not present

## 2016-07-11 DIAGNOSIS — R079 Chest pain, unspecified: Secondary | ICD-10-CM | POA: Diagnosis present

## 2016-07-11 DIAGNOSIS — Z9114 Patient's other noncompliance with medication regimen: Secondary | ICD-10-CM | POA: Insufficient documentation

## 2016-07-11 DIAGNOSIS — R0602 Shortness of breath: Secondary | ICD-10-CM | POA: Diagnosis present

## 2016-07-11 DIAGNOSIS — I5043 Acute on chronic combined systolic (congestive) and diastolic (congestive) heart failure: Secondary | ICD-10-CM

## 2016-07-11 DIAGNOSIS — E785 Hyperlipidemia, unspecified: Secondary | ICD-10-CM | POA: Diagnosis not present

## 2016-07-11 DIAGNOSIS — R0789 Other chest pain: Secondary | ICD-10-CM

## 2016-07-11 DIAGNOSIS — R0603 Acute respiratory distress: Principal | ICD-10-CM | POA: Insufficient documentation

## 2016-07-11 DIAGNOSIS — F17200 Nicotine dependence, unspecified, uncomplicated: Secondary | ICD-10-CM | POA: Diagnosis not present

## 2016-07-11 DIAGNOSIS — I481 Persistent atrial fibrillation: Secondary | ICD-10-CM | POA: Insufficient documentation

## 2016-07-11 DIAGNOSIS — Z86718 Personal history of other venous thrombosis and embolism: Secondary | ICD-10-CM | POA: Insufficient documentation

## 2016-07-11 DIAGNOSIS — I509 Heart failure, unspecified: Secondary | ICD-10-CM | POA: Insufficient documentation

## 2016-07-11 DIAGNOSIS — I4891 Unspecified atrial fibrillation: Secondary | ICD-10-CM | POA: Diagnosis present

## 2016-07-11 DIAGNOSIS — R32 Unspecified urinary incontinence: Secondary | ICD-10-CM | POA: Insufficient documentation

## 2016-07-11 DIAGNOSIS — I10 Essential (primary) hypertension: Secondary | ICD-10-CM | POA: Diagnosis present

## 2016-07-11 DIAGNOSIS — Z7901 Long term (current) use of anticoagulants: Secondary | ICD-10-CM | POA: Insufficient documentation

## 2016-07-11 DIAGNOSIS — I480 Paroxysmal atrial fibrillation: Secondary | ICD-10-CM | POA: Insufficient documentation

## 2016-07-11 DIAGNOSIS — I5031 Acute diastolic (congestive) heart failure: Secondary | ICD-10-CM | POA: Insufficient documentation

## 2016-07-11 DIAGNOSIS — Z7982 Long term (current) use of aspirin: Secondary | ICD-10-CM | POA: Insufficient documentation

## 2016-07-11 DIAGNOSIS — I4819 Other persistent atrial fibrillation: Secondary | ICD-10-CM

## 2016-07-11 DIAGNOSIS — I5030 Unspecified diastolic (congestive) heart failure: Secondary | ICD-10-CM | POA: Insufficient documentation

## 2016-07-11 DIAGNOSIS — L03115 Cellulitis of right lower limb: Secondary | ICD-10-CM | POA: Insufficient documentation

## 2016-07-11 DIAGNOSIS — I872 Venous insufficiency (chronic) (peripheral): Secondary | ICD-10-CM | POA: Diagnosis present

## 2016-07-11 DIAGNOSIS — L97829 Non-pressure chronic ulcer of other part of left lower leg with unspecified severity: Secondary | ICD-10-CM | POA: Insufficient documentation

## 2016-07-11 HISTORY — DX: Unspecified atrial fibrillation: I48.91

## 2016-07-11 LAB — COMPREHENSIVE METABOLIC PANEL
ALT: 14 U/L (ref 14–54)
AST: 19 U/L (ref 15–41)
Albumin: 3.6 g/dL (ref 3.5–5.0)
Alkaline Phosphatase: 85 U/L (ref 38–126)
Anion gap: 13 (ref 5–15)
BUN: 8 mg/dL (ref 6–20)
CO2: 19 mmol/L — ABNORMAL LOW (ref 22–32)
Calcium: 9 mg/dL (ref 8.9–10.3)
Chloride: 107 mmol/L (ref 101–111)
Creatinine, Ser: 1.01 mg/dL — ABNORMAL HIGH (ref 0.44–1.00)
GFR calc Af Amer: >60 mL/min (ref >60)
GFR calc non Af Amer: 57 mL/min — ABNORMAL LOW (ref >60)
Glucose, Bld: 117 mg/dL — ABNORMAL HIGH (ref 65–99)
Potassium: 3.6 mmol/L (ref 3.5–5.1)
Sodium: 139 mmol/L (ref 135–145)
Total Bilirubin: 1.4 mg/dL — ABNORMAL HIGH (ref 0.3–1.2)
Total Protein: 6.9 g/dL (ref 6.5–8.1)

## 2016-07-11 LAB — CBC
HCT: 37.9 % (ref 36.0–46.0)
Hemoglobin: 12.9 g/dL (ref 12.0–15.0)
MCH: 28.9 pg (ref 26.0–34.0)
MCHC: 34 g/dL (ref 30.0–36.0)
MCV: 85 fL (ref 78.0–100.0)
Platelets: 361 10*3/uL (ref 150–400)
RBC: 4.46 MIL/uL (ref 3.87–5.11)
RDW: 14 % (ref 11.5–15.5)
WBC: 13.7 10*3/uL — ABNORMAL HIGH (ref 4.0–10.5)

## 2016-07-11 LAB — LACTIC ACID, PLASMA
Lactic Acid, Venous: 2.3 mmol/L (ref 0.5–1.9)
Lactic Acid, Venous: 1.7 mmol/L (ref 0.5–1.9)

## 2016-07-11 LAB — TROPONIN I
Troponin I: 0.06 ng/mL (ref <0.03)
Troponin I: 0.05 ng/mL (ref <0.03)
Troponin I: 0.07 ng/mL (ref <0.03)

## 2016-07-11 LAB — BRAIN NATRIURETIC PEPTIDE: B Natriuretic Peptide: 527.1 pg/mL — ABNORMAL HIGH (ref 0.0–100.0)

## 2016-07-11 LAB — LIPASE, BLOOD: Lipase: <10 U/L — ABNORMAL LOW (ref 11–51)

## 2016-07-11 MED ORDER — CEPHALEXIN 500 MG PO CAPS
500.0000 mg | ORAL_CAPSULE | Freq: Four times a day (QID) | ORAL | Status: DC
  Administered 2016-07-11 – 2016-07-13 (×8): 500 mg via ORAL
  Filled 2016-07-11 (×2): qty 1
  Filled 2016-07-11: qty 2
  Filled 2016-07-11 (×2): qty 1
  Filled 2016-07-11: qty 2
  Filled 2016-07-11 (×2): qty 1
  Filled 2016-07-11: qty 2
  Filled 2016-07-11: qty 1

## 2016-07-11 MED ORDER — ASPIRIN EC 81 MG PO TBEC
81.0000 mg | DELAYED_RELEASE_TABLET | Freq: Every day | ORAL | Status: DC
  Administered 2016-07-12 – 2016-07-13 (×2): 81 mg via ORAL
  Filled 2016-07-11 (×2): qty 1

## 2016-07-11 MED ORDER — ACETAMINOPHEN 325 MG PO TABS: 650.0000 mg | ORAL_TABLET | ORAL | Status: DC | PRN

## 2016-07-11 MED ORDER — ALBUTEROL SULFATE (2.5 MG/3ML) 0.083% IN NEBU
5.0000 mg | INHALATION_SOLUTION | Freq: Once | RESPIRATORY_TRACT | Status: AC
  Administered 2016-07-11: 5 mg via RESPIRATORY_TRACT
  Filled 2016-07-11: qty 6

## 2016-07-11 MED ORDER — FUROSEMIDE 10 MG/ML IJ SOLN
40.0000 mg | Freq: Two times a day (BID) | INTRAMUSCULAR | Status: DC
  Administered 2016-07-11 – 2016-07-13 (×4): 40 mg via INTRAVENOUS
  Filled 2016-07-11 (×4): qty 4

## 2016-07-11 MED ORDER — RIVAROXABAN 20 MG PO TABS
20.0000 mg | ORAL_TABLET | ORAL | Status: DC
  Administered 2016-07-12 – 2016-07-13 (×2): 20 mg via ORAL
  Filled 2016-07-11 (×2): qty 1

## 2016-07-11 MED ORDER — SODIUM CHLORIDE 0.9% FLUSH
3.0000 mL | Freq: Two times a day (BID) | INTRAVENOUS | Status: DC
  Administered 2016-07-12 (×2): 3 mL via INTRAVENOUS
  Administered 2016-07-13: 10 mL via INTRAVENOUS

## 2016-07-11 MED ORDER — POTASSIUM CHLORIDE CRYS ER 10 MEQ PO TBCR
10.0000 meq | EXTENDED_RELEASE_TABLET | Freq: Two times a day (BID) | ORAL | Status: DC
  Administered 2016-07-11: 10 meq via ORAL
  Filled 2016-07-11: qty 1

## 2016-07-11 MED ORDER — IPRATROPIUM-ALBUTEROL 0.5-2.5 (3) MG/3ML IN SOLN: 3.0000 mL | RESPIRATORY_TRACT | Status: DC | PRN

## 2016-07-11 MED ORDER — METHYLPREDNISOLONE SODIUM SUCC 125 MG IJ SOLR
80.0000 mg | Freq: Once | INTRAMUSCULAR | Status: AC
  Administered 2016-07-11: 80 mg via INTRAVENOUS
  Filled 2016-07-11: qty 2

## 2016-07-11 MED ORDER — FUROSEMIDE 10 MG/ML IJ SOLN
40.0000 mg | Freq: Once | INTRAMUSCULAR | Status: AC
  Administered 2016-07-11: 40 mg via INTRAVENOUS
  Filled 2016-07-11: qty 4

## 2016-07-11 MED ORDER — IPRATROPIUM BROMIDE 0.02 % IN SOLN
0.5000 mg | Freq: Once | RESPIRATORY_TRACT | Status: AC
  Administered 2016-07-11: 0.5 mg via RESPIRATORY_TRACT
  Filled 2016-07-11: qty 2.5

## 2016-07-11 MED ORDER — SODIUM CHLORIDE 0.9% FLUSH: 3.0000 mL | INTRAVENOUS | Status: DC | PRN

## 2016-07-11 MED ORDER — IPRATROPIUM-ALBUTEROL 0.5-2.5 (3) MG/3ML IN SOLN
3.0000 mL | Freq: Three times a day (TID) | RESPIRATORY_TRACT | Status: DC
  Administered 2016-07-12 – 2016-07-13 (×3): 3 mL via RESPIRATORY_TRACT
  Filled 2016-07-11 (×4): qty 3

## 2016-07-11 MED ORDER — ALPRAZOLAM 0.25 MG PO TABS
0.2500 mg | ORAL_TABLET | Freq: Two times a day (BID) | ORAL | Status: DC | PRN
  Administered 2016-07-12: 0.25 mg via ORAL
  Filled 2016-07-11: qty 1

## 2016-07-11 MED ORDER — PANTOPRAZOLE SODIUM 40 MG PO TBEC
40.0000 mg | DELAYED_RELEASE_TABLET | Freq: Every day | ORAL | Status: DC
Start: 2016-07-11 — End: 2016-07-13
  Administered 2016-07-11 – 2016-07-13 (×3): 40 mg via ORAL
  Filled 2016-07-11 (×3): qty 1

## 2016-07-11 MED ORDER — IPRATROPIUM-ALBUTEROL 0.5-2.5 (3) MG/3ML IN SOLN
3.0000 mL | RESPIRATORY_TRACT | Status: DC
  Administered 2016-07-11: 3 mL via RESPIRATORY_TRACT
  Filled 2016-07-11 (×2): qty 3

## 2016-07-11 MED ORDER — METHYLPREDNISOLONE SODIUM SUCC 125 MG IJ SOLR
60.0000 mg | Freq: Two times a day (BID) | INTRAMUSCULAR | Status: DC
  Administered 2016-07-11 – 2016-07-12 (×2): 60 mg via INTRAVENOUS
  Filled 2016-07-11 (×2): qty 2

## 2016-07-11 MED ORDER — METHYLPREDNISOLONE SODIUM SUCC 125 MG IJ SOLR: 60.0000 mg | Freq: Four times a day (QID) | INTRAMUSCULAR | Status: DC

## 2016-07-11 MED ORDER — SODIUM CHLORIDE 0.9 % IV SOLN: 250.0000 mL | INTRAVENOUS | Status: DC | PRN

## 2016-07-11 MED ORDER — ONDANSETRON HCL 4 MG/2ML IJ SOLN: 4.0000 mg | Freq: Four times a day (QID) | INTRAMUSCULAR | Status: DC | PRN

## 2016-07-11 NOTE — H&P (Signed)
History and Physical    Sharon Vasquez QBH:[id number] DOB: November 24, 1950 DOA: 07/11/2016  PCP: Pcp Not In System Patient coming from: home  Chief Complaint: sob/chest pain/worsening LE edema/n/v  HPI: Sharon Vasquez is a very pleasant 66 y.o. female with medical history significant for hyperlipidemia, CHF, COPD not on home oxygen, A. fib morbid obesity peripheral vascular disease PE emergency Department chief complaint of persistent worsening shortness of breath chest pain nausea diarrhea. Initial evaluation reveals acute respiratory distress likely related to acute on chronic heart failure in the setting of a mild COPD exacerbation as well as cellulitis of the left lower extremity  Information is obtained from the patient and her daughter and sister at the bedside. She reports over the last 3 days she developed gradual worsening shortness of breath. Associated symptoms include generalized weakness several watery brown stools each day nausea without emesis subjective fever nonproductive cough chest pain with coughing. She reports the pain as a pressure located left anterior worse with coughing. She also reports that she has chronic swelling to both her legs and her daughter feels like the swelling is a little bigger. Patient has an Haematologist on the left lower extremity. Patient's primary caregivers are in [place] she has not followed up with any in quite a while. [place] being out of most of her home medications. She denies dysuria hematuria frequency or urgency. Does continue to smoke.  ED Course: In emergency department she's afebrile with a somewhat soft blood pressure she's tachycardia tachypnea  leukocytosis 13.7 she is provided with nebulizer Solu-Medrol and Lasix intravenously.  Review of Systems: As per HPI otherwise 10 point review of systems negative.   Ambulatory Status ambulates with a walker no recent falls  Past Medical History:  Diagnosis Date  . A-fib ([place])   . CHF (congestive heart  failure) ([place])   . COPD (chronic obstructive pulmonary disease) ([place])   . Hyperlipidemia   . Hypertension   . PE (pulmonary embolism)     Past Surgical History:  Procedure Laterality Date  . ABDOMINAL HYSTERECTOMY    . APPENDECTOMY    . TONSILLECTOMY      Social History   Social History  . Marital status: Single    Spouse name: N/A  . Number of children: N/A  . Years of education: N/A   Occupational History  . Not on file.   Social History Main Topics  . Smoking status: Current Every Day Smoker  . Smokeless tobacco: Never Used  . Alcohol use No  . Drug use: No  . Sexual activity: Not on file   Other Topics Concern  . Not on file   Social History Narrative  . No narrative on file    No Known Allergies  History reviewed. No pertinent family history.  Prior to Admission medications   Medication Sig Start Date End Date Taking? Authorizing Provider  amLODipine-benazepril (LOTREL) 10-40 MG capsule Take 1 capsule by mouth daily.    Yes Historical Provider, MD  aspirin EC 81 MG tablet Take 81 mg by mouth daily.   Yes Historical Provider, MD  celecoxib (CELEBREX) 200 MG capsule Take 200 mg by mouth 2 (two) times daily as needed (inflammation).   Yes Historical Provider, MD  esomeprazole (NEXIUM) 40 MG capsule Take 40 mg by mouth daily as needed (for heartburn or indigestion).   Yes Historical Provider, MD  gabapentin (NEURONTIN) 400 MG capsule Take 400 mg by mouth 2 (two) times daily.    Yes Historical  Provider, MD  potassium chloride (K-DUR,KLOR-CON) 10 MEQ tablet Take 10 mEq by mouth 2 (two) times daily.   Yes Historical Provider, MD  rivaroxaban (XARELTO) 20 MG TABS tablet Take 20 mg by mouth every morning.    Yes Historical Provider, MD    Physical Exam: Vitals:   07/11/16 1400 07/11/16 1415 07/11/16 1430 07/11/16 1500  BP: 105/66 100/64 101/64 (!) 103/56  Pulse:   98 99  Resp: (!) 29 (!) 31 (!) 28 20  Temp:      TempSrc:      SpO2:   97% 99%  Weight:        Height:         General:  Appears calm and comfortable No acute distress Eyes:  PERRL, EOMI, normal lids, iris ENT:  grossly normal hearing, lips & tongue, mucous membranes of her mouth are moist and pink Neck:  no LAD, masses or thyromegaly Cardiovascular:  Irregularly irregular, no m/r/g. 2-3+ LE edema. Unna on left, erythema to skin above Technical sales engineer boot removed skin with venous stasis changes as well as erythema, heat tenderness. Small open non-draining area anterior ankle. No odor.  Respiratory:  Mild increased work of breathing with conversation. Fair air movement. I hear no crackles some faint expiratory wheezing throughout. Abdomen:  soft, ntnd, NABS Skin:  no rash or induration seen on limited exam Musculoskeletal:  grossly normal tone BUE/BLE, good ROM, no bony abnormality Psychiatric:  grossly normal mood and affect, speech fluent and appropriate, AOx3 Neurologic:  CN 2-12 grossly intact, moves all extremities in coordinated fashion, sensation intact  Labs on Admission: I have personally reviewed following labs and imaging studies  CBC:  Recent Labs Lab 07/11/16 1149  WBC 13.7*  HGB 12.9  HCT 37.9  MCV 85.0  PLT 818   Basic Metabolic Panel:  Recent Labs Lab 07/11/16 1101  NA 139  K 3.6  CL 107  CO2 19*  GLUCOSE 117*  BUN 8  CREATININE 1.01*  CALCIUM 9.0   GFR: Estimated Creatinine Clearance: 90.6 mL/min (A) (by C-G formula based on SCr of 1.01 mg/dL (H)). Liver Function Tests:  Recent Labs Lab 07/11/16 1101  AST 19  ALT 14  ALKPHOS 85  BILITOT 1.4*  PROT 6.9  ALBUMIN 3.6    Recent Labs Lab 07/11/16 1101  LIPASE <10*   No results for input(s): AMMONIA in the last 168 hours. Coagulation Profile: No results for input(s): INR, PROTIME in the last 168 hours. Cardiac Enzymes:  Recent Labs Lab 07/11/16 1132  TROPONINI 0.06*   BNP (last 3 results) No results for input(s): PROBNP in the last 8760 hours. HbA1C: No results for  input(s): HGBA1C in the last 72 hours. CBG: No results for input(s): GLUCAP in the last 168 hours. Lipid Profile: No results for input(s): CHOL, HDL, LDLCALC, TRIG, CHOLHDL, LDLDIRECT in the last 72 hours. Thyroid Function Tests: No results for input(s): TSH, T4TOTAL, FREET4, T3FREE, THYROIDAB in the last 72 hours. Anemia Panel: No results for input(s): VITAMINB12, FOLATE, FERRITIN, TIBC, IRON, RETICCTPCT in the last 72 hours. Urine analysis: No results found for: COLORURINE, APPEARANCEUR, LABSPEC, PHURINE, GLUCOSEU, HGBUR, BILIRUBINUR, KETONESUR, PROTEINUR, UROBILINOGEN, NITRITE, LEUKOCYTESUR  Creatinine Clearance: Estimated Creatinine Clearance: 90.6 mL/min (A) (by C-G formula based on SCr of 1.01 mg/dL (H)).  Sepsis Labs: @LABRCNTIP (procalcitonin:4,lacticidven:4) )No results found for this or any previous visit (from the past 240 hour(s)).   Radiological Exams on Admission: Dg Chest Port 1 View  Result Date: 07/11/2016 CLINICAL DATA:  Shortness of breath, abdominal pain for 5 days, smoker EXAM: PORTABLE CHEST 1 VIEW COMPARISON:  12/09/2015 FINDINGS: Cardiomegaly again noted. No infiltrate or pleural effusion. No pulmonary edema. Degenerative changes right acromioclavicular joint. IMPRESSION: No active disease.  Cardiomegaly again noted. Electronically Signed   By: Lahoma Crocker M.D.   On: 07/11/2016 14:15    EKG: Independently reviewed. Atrial fibrillation ST & T wave abnormality, consider inferior ischemia ST & T wave abnormality, consider anterolateral ischemia  Assessment/Plan Principal Problem:   Acute respiratory distress Active Problems:   Morbid obesity due to excess calories (HCC)   Peripheral vascular disease (HCC)   Benign essential HTN   COPD exacerbation (HCC)   CHF (congestive heart failure) (HCC)   Acute on chronic heart failure (HCC)   Elevated troponin   Chest pain   Pulmonary embolism (HCC)   Cellulitis   SIRS (systemic inflammatory response syndrome) (HCC)    A-fib (HCC)   Tobacco use   1. Acute respiratory distress likely related to acute on chronic heart failure in setting of mild COPD exacerbation. This x-ray with cardiomegaly, BNP 547, oxygen saturation level greater than 90% on room air with tachypnea. Home medications include amlodipine and benazapril -Admit to telemetry -Continue IV Lasix -Intake and output -Obtain daily weights -Continue nebs -Antihypertensives for now as blood pressure somewhat soft in the setting of IV Lasix -Blood pressure closely and resume home meds as indicated -2-D echo  2. Acute on chronic CHF likely diastolic. She has been noncompliant with her medications. Current list is not indicate diuretic. Chest x-ray as noted above BNP elevated in the emergency department. EKG as noted above. -See #1 -Will need outpatient follow-up -Reports that he has appointment for same lipid practice at [facility] on April 16  #3. COPD with exacerbation. Chest x-ray as noted above -See #1 -Benefit from outpatient pulmonary function tests -She continues to smoke  4. A. Fib [place] score 4. On xaerlto. Rate controlled. EKG as noted above -continue xaerlto -monitor  5. Chest pain/elevated troponin. likley related to demand in setting of above.  -resolved at admission -cycle troponin -serial ekg -may benefit OP cards work up  #6. Cellulitis. Left leg with erythema swelling heat tenderness in setting of pvd. She is afebrile. She does have leukocytosis hypotension tachycardia. Small Open wound -Consult -replace Unna boot -antibiotics per protocol -check lacitic acid -blood culures  7. hypertension. Blood pressure somewhat soft in the emergency department. She was also provided with Lasix. - hold Meds for now -likely to resume ACE tomorrow -monitor  #8. Sirs. Leukocytosis, tachycardia, tachypnea, hypotension. Lactic acid pending. urinalyisis unremarkable. Chest xray without infiltrate. LLE with possible cellulitis. She received 1  liter NS in ED -trend lactic acid -Antibiotics per protocol -See above  DVT prophylaxis: xarelto Code Status: full  Family Communication: daughter and sister at bedside  Disposition Plan: home  Consults called: none  Admission status: obs    Dyanne Carrel M MD [facility]  If 7PM-7AM, please contact night-coverage [email or web address] Password [email or web address]  07/11/2016, 3:40 PM

## 2016-07-11 NOTE — ED Notes (Signed)
Pt up to bedpan for stool mixed specimen.

## 2016-07-11 NOTE — Progress Notes (Signed)
Orthopedic Tech Progress Note Patient Details:  Sharon Vasquez Aug 14, 1950 [id number]  Ortho Devices Type of Ortho Device: Louretta Parma boot Ortho Device/Splint Location: LLE Ortho Device/Splint Interventions: Ordered, Application   Braulio Bosch 07/11/2016, 6:29 PM

## 2016-07-11 NOTE — ED Triage Notes (Signed)
Per Pt, Pt is coming from home with complaints of SOB that started on Friday. Reports some nausea, vomiting, and diarrhea along with SOB. Denies Chest pain, but reports headache and body aches.

## 2016-07-11 NOTE — ED Provider Notes (Signed)
[facility] DEPT Provider Note   CSN: [id number] Arrival date & time: 07/11/16  1029     History   Chief Complaint Chief Complaint  Patient presents with  . Shortness of Breath  . Emesis    HPI Sharon Vasquez is a 66 y.o. female.  Patient w hx copd, chf, c/o diarrhea in past 2-3 days, multiple episodes, watery stool. Denies known bad food ingestion, ill contacts, or recent antibiotic use. Nausea. No vomiting. No abd pain. Also c/o chest tightness and increased sob in the past few days. States compliant w normal meds. Occasional non prod cough. No exertional cp. Denies pleuritic pain. Chronic swelling to bilateral legs, pt denies recent change. No fever or chills.    The history is provided by the patient and the EMS personnel.  Shortness of Breath  Associated symptoms include cough, chest pain and leg swelling. Pertinent negatives include no fever, no headaches, no sore throat, no neck pain, no vomiting, no abdominal pain and no rash.  Emesis   Associated symptoms include cough and diarrhea. Pertinent negatives include no abdominal pain, no chills, no fever and no headaches.    Past Medical History:  Diagnosis Date  . CHF (congestive heart failure) ([place])   . COPD (chronic obstructive pulmonary disease) ([place])   . Hyperlipidemia   . Hypertension   . PE (pulmonary embolism)     Patient Active Problem List   Diagnosis Date Noted  . Choking   . Morbid obesity due to excess calories ([place])   . Peripheral vascular disease ([place])   . Benign essential HTN   . Hoarseness 12/09/2015    Past Surgical History:  Procedure Laterality Date  . ABDOMINAL HYSTERECTOMY    . APPENDECTOMY    . TONSILLECTOMY      OB History    No data available       Home Medications    Prior to Admission medications   Medication Sig Start Date End Date Taking? Authorizing Provider  albuterol (PROVENTIL) (2.5 MG/3ML) 0.083% nebulizer solution Take 3 mLs (2.5 mg total) by nebulization every 4  (four) hours as needed for wheezing or shortness of breath. 09/23/15   Veryl Speak, MD  amLODipine-benazepril (LOTREL) 10-40 MG capsule Take 1 capsule by mouth daily as needed (takes occassionally).     Historical Provider, MD  aspirin EC 81 MG tablet Take 81 mg by mouth daily.    Historical Provider, MD  carvedilol (COREG) 25 MG tablet Take 25 mg by mouth 2 (two) times daily as needed (for BP).     Historical Provider, MD  celecoxib (CELEBREX) 200 MG capsule Take 200 mg by mouth 2 (two) times daily as needed (inflammation).    Historical Provider, MD  esomeprazole (NEXIUM) 40 MG capsule Take 40 mg by mouth daily as needed (for heartburn or indigestion).    Historical Provider, MD  gabapentin (NEURONTIN) 400 MG capsule Take 400-800 mg by mouth daily.     Historical Provider, MD  naproxen (NAPROSYN) 500 MG tablet Take 500 mg by mouth 2 (two) times daily as needed (pain).    Historical Provider, MD  nortriptyline (PAMELOR) 25 MG capsule Take 50 mg by mouth at bedtime as needed for sleep.     Historical Provider, MD  potassium chloride (K-DUR,KLOR-CON) 10 MEQ tablet Take 10 mEq by mouth 2 (two) times daily.    Historical Provider, MD  pravastatin (PRAVACHOL) 20 MG tablet Take 20 mg by mouth daily.    Historical Provider, MD  rivaroxaban (  XARELTO) 20 MG TABS tablet Take 20 mg by mouth every morning.     Historical Provider, MD  silver sulfADIAZINE (SILVADENE) 1 % cream Apply 1 application topically daily. Patient taking differently: Apply 1 application topically daily as needed (for legs).  09/23/15   Veryl Speak, MD  tiotropium (SPIRIVA) 18 MCG inhalation capsule Place 18 mcg into inhaler and inhale daily.    Historical Provider, MD  torsemide (DEMADEX) 20 MG tablet Take 40 mg by mouth daily.     Historical Provider, MD    Family History History reviewed. No pertinent family history.  Social History Social History  Substance Use Topics  . Smoking status: Current Every Day Smoker  . Smokeless  tobacco: Never Used  . Alcohol use No     Allergies   Patient has no known allergies.   Review of Systems Review of Systems  Constitutional: Negative for chills and fever.  HENT: Negative for sore throat.   Eyes: Negative for redness.  Respiratory: Positive for cough and shortness of breath.   Cardiovascular: Positive for chest pain and leg swelling.  Gastrointestinal: Positive for diarrhea and nausea. Negative for abdominal pain and vomiting.  Genitourinary: Negative for dysuria and flank pain.  Musculoskeletal: Negative for back pain and neck pain.  Skin: Negative for rash.  Neurological: Negative for headaches.  Hematological: Does not bruise/bleed easily.  Psychiatric/Behavioral: Negative for confusion.     Physical Exam Updated Vital Signs BP (!) 143/100 (BP Location: Right Arm)   Pulse 86   Temp 97.6 F (36.4 C) (Oral)   Resp (!) 24   Ht 6\' 2"  (1.88 m)   Wt (!) 145.2 kg   SpO2 95%   BMI 41.09 kg/m   Physical Exam  Constitutional: She appears well-developed and well-nourished. No distress.  HENT:  Mouth/Throat: Oropharynx is clear and moist.  Eyes: Conjunctivae are normal. No scleral icterus.  Neck: Neck supple. No tracheal deviation present.  Cardiovascular: Normal rate, regular rhythm, normal heart sounds and intact distal pulses.  Exam reveals no gallop and no friction rub.   No murmur heard. Pulmonary/Chest: Effort normal. No respiratory distress. She has wheezes.  Rales base  Abdominal: Soft. Normal appearance and bowel sounds are normal. She exhibits no distension.  obese  Genitourinary:  Genitourinary Comments: No cva tenderness  Musculoskeletal: She exhibits edema.  3+ edema bil legs.   Neurological: She is alert.  Skin: Skin is warm and dry. No rash noted.  Psychiatric: She has a normal mood and affect.  Nursing note and vitals reviewed.    ED Treatments / Results  Labs (all labs ordered are listed, but only abnormal results are  displayed) Results for orders placed or performed during the hospital encounter of 07/11/16  Lipase, blood  Result Value Ref Range   Lipase <10 (L) 11 - 51 U/L  Comprehensive metabolic panel  Result Value Ref Range   Sodium 139 135 - 145 mmol/L   Potassium 3.6 3.5 - 5.1 mmol/L   Chloride 107 101 - 111 mmol/L   CO2 19 (L) 22 - 32 mmol/L   Glucose, Bld 117 (H) 65 - 99 mg/dL   BUN 8 6 - 20 mg/dL   Creatinine, Ser 1.01 (H) 0.44 - 1.00 mg/dL   Calcium 9.0 8.9 - 10.3 mg/dL   Total Protein 6.9 6.5 - 8.1 g/dL   Albumin 3.6 3.5 - 5.0 g/dL   AST 19 15 - 41 U/L   ALT 14 14 - 54 U/L   Alkaline  Phosphatase 85 38 - 126 U/L   Total Bilirubin 1.4 (H) 0.3 - 1.2 mg/dL   GFR calc non Af Amer 57 (L) >60 mL/min   GFR calc Af Amer >60 >60 mL/min   Anion gap 13 5 - 15  CBC  Result Value Ref Range   WBC 13.7 (H) 4.0 - 10.5 K/uL   RBC 4.46 3.87 - 5.11 MIL/uL   Hemoglobin 12.9 12.0 - 15.0 g/dL   HCT 37.9 36.0 - 46.0 %   MCV 85.0 78.0 - 100.0 fL   MCH 28.9 26.0 - 34.0 pg   MCHC 34.0 30.0 - 36.0 g/dL   RDW 14.0 11.5 - 15.5 %   Platelets 361 150 - 400 K/uL  Brain natriuretic peptide  Result Value Ref Range   B Natriuretic Peptide 527.1 (H) 0.0 - 100.0 pg/mL  Troponin I  Result Value Ref Range   Troponin I 0.06 (HH) <0.03 ng/mL   Dg Chest Port 1 View  Result Date: 07/11/2016 CLINICAL DATA:  Shortness of breath, abdominal pain for 5 days, smoker EXAM: PORTABLE CHEST 1 VIEW COMPARISON:  12/09/2015 FINDINGS: Cardiomegaly again noted. No infiltrate or pleural effusion. No pulmonary edema. Degenerative changes right acromioclavicular joint. IMPRESSION: No active disease.  Cardiomegaly again noted. Electronically Signed   By: Lahoma Crocker M.D.   On: 07/11/2016 14:15    EKG  EKG Interpretation  Date/Time:  Tuesday July 11 2016 10:50:43 EDT Ventricular Rate:  76 PR Interval:    QRS Duration: 98 QT Interval:  410 QTC Calculation: 461 R Axis:   -22 Text Interpretation:  Atrial fibrillation ST & T  wave abnormality, consider inferior ischemia ST & T wave abnormality, consider anterolateral ischemia Confirmed by Ashok Cordia  MD, Lennette Bihari ([id number]) on 07/11/2016 11:05:37 AM       Radiology Dg Chest Port 1 View  Result Date: 07/11/2016 CLINICAL DATA:  Shortness of breath, abdominal pain for 5 days, smoker EXAM: PORTABLE CHEST 1 VIEW COMPARISON:  12/09/2015 FINDINGS: Cardiomegaly again noted. No infiltrate or pleural effusion. No pulmonary edema. Degenerative changes right acromioclavicular joint. IMPRESSION: No active disease.  Cardiomegaly again noted. Electronically Signed   By: Lahoma Crocker M.D.   On: 07/11/2016 14:15    Procedures Procedures (including critical care time)  Medications Ordered in ED Medications - No data to display   Initial Impression / Assessment and Plan / ED Course  I have reviewed the triage vital signs and the nursing notes.  Pertinent labs & imaging results that were available during my care of the patient were reviewed by me and considered in my medical decision making (see chart for details).  Iv ns. Continuous pulse ox and monitor. o2 [place].   Labs. cxr.  Reviewed nursing notes and prior charts for additional history.   Albuterol and atrovent neb.  Persistent wheezing. Solumedrol iv. Additional albuterol and atrovent neb.  bnp is elevated compared to prior. Lasix iv.   Recheck breathing mildly improved, still dyspneic/wheezing.  Will consult medicine service for admission.   Final Clinical Impressions(s) / ED Diagnoses   Final diagnoses:  None    New Prescriptions New Prescriptions   No medications on file     Lajean Saver, MD 07/11/16 1442

## 2016-07-11 NOTE — Progress Notes (Signed)
NP text paged regarding lactic acid result.

## 2016-07-12 ENCOUNTER — Encounter (HOSPITAL_COMMUNITY): Payer: Self-pay | Admitting: Cardiology

## 2016-07-12 ENCOUNTER — Observation Stay (HOSPITAL_BASED_OUTPATIENT_CLINIC_OR_DEPARTMENT_OTHER): Payer: Medicare Other

## 2016-07-12 DIAGNOSIS — R0600 Dyspnea, unspecified: Secondary | ICD-10-CM | POA: Diagnosis not present

## 2016-07-12 DIAGNOSIS — Z7901 Long term (current) use of anticoagulants: Secondary | ICD-10-CM

## 2016-07-12 DIAGNOSIS — I872 Venous insufficiency (chronic) (peripheral): Secondary | ICD-10-CM | POA: Diagnosis present

## 2016-07-12 DIAGNOSIS — R0603 Acute respiratory distress: Secondary | ICD-10-CM | POA: Diagnosis not present

## 2016-07-12 DIAGNOSIS — J449 Chronic obstructive pulmonary disease, unspecified: Secondary | ICD-10-CM | POA: Diagnosis not present

## 2016-07-12 DIAGNOSIS — I5043 Acute on chronic combined systolic (congestive) and diastolic (congestive) heart failure: Secondary | ICD-10-CM | POA: Diagnosis not present

## 2016-07-12 DIAGNOSIS — J441 Chronic obstructive pulmonary disease with (acute) exacerbation: Secondary | ICD-10-CM | POA: Diagnosis not present

## 2016-07-12 DIAGNOSIS — I110 Hypertensive heart disease with heart failure: Secondary | ICD-10-CM | POA: Diagnosis not present

## 2016-07-12 DIAGNOSIS — R748 Abnormal levels of other serum enzymes: Secondary | ICD-10-CM

## 2016-07-12 DIAGNOSIS — I10 Essential (primary) hypertension: Secondary | ICD-10-CM | POA: Diagnosis not present

## 2016-07-12 LAB — BASIC METABOLIC PANEL
Anion gap: 11 (ref 5–15)
BUN: 16 mg/dL (ref 6–20)
CO2: 21 mmol/L — ABNORMAL LOW (ref 22–32)
Calcium: 8.8 mg/dL — ABNORMAL LOW (ref 8.9–10.3)
Chloride: 106 mmol/L (ref 101–111)
Creatinine, Ser: 0.98 mg/dL (ref 0.44–1.00)
GFR calc Af Amer: >60 mL/min (ref >60)
GFR calc non Af Amer: 59 mL/min — ABNORMAL LOW (ref >60)
Glucose, Bld: 188 mg/dL — ABNORMAL HIGH (ref 65–99)
Potassium: 3.2 mmol/L — ABNORMAL LOW (ref 3.5–5.1)
Sodium: 138 mmol/L (ref 135–145)

## 2016-07-12 LAB — CBC
HCT: 37.7 % (ref 36.0–46.0)
Hemoglobin: 12.9 g/dL (ref 12.0–15.0)
MCH: 29 pg (ref 26.0–34.0)
MCHC: 34.2 g/dL (ref 30.0–36.0)
MCV: 84.7 fL (ref 78.0–100.0)
Platelets: 354 10*3/uL (ref 150–400)
RBC: 4.45 MIL/uL (ref 3.87–5.11)
RDW: 14.1 % (ref 11.5–15.5)
WBC: 13.6 10*3/uL — ABNORMAL HIGH (ref 4.0–10.5)

## 2016-07-12 LAB — ECHOCARDIOGRAM COMPLETE
Height: 74 in
Weight: 4979.2 oz

## 2016-07-12 MED ORDER — POTASSIUM CHLORIDE CRYS ER 20 MEQ PO TBCR
40.0000 meq | EXTENDED_RELEASE_TABLET | Freq: Every day | ORAL | Status: DC
  Administered 2016-07-13: 40 meq via ORAL
  Filled 2016-07-12: qty 2

## 2016-07-12 MED ORDER — POTASSIUM CHLORIDE CRYS ER 20 MEQ PO TBCR
40.0000 meq | EXTENDED_RELEASE_TABLET | Freq: Once | ORAL | Status: AC
  Administered 2016-07-12: 40 meq via ORAL
  Filled 2016-07-12: qty 2

## 2016-07-12 MED ORDER — METHYLPREDNISOLONE SODIUM SUCC 125 MG IJ SOLR
60.0000 mg | Freq: Every day | INTRAMUSCULAR | Status: DC
  Administered 2016-07-13: 60 mg via INTRAVENOUS
  Filled 2016-07-12: qty 2

## 2016-07-12 MED ORDER — AMIODARONE HCL 200 MG PO TABS
200.0000 mg | ORAL_TABLET | Freq: Two times a day (BID) | ORAL | Status: DC
  Administered 2016-07-12 – 2016-07-13 (×3): 200 mg via ORAL
  Filled 2016-07-12 (×3): qty 1

## 2016-07-12 MED ORDER — POTASSIUM CHLORIDE CRYS ER 20 MEQ PO TBCR
40.0000 meq | EXTENDED_RELEASE_TABLET | Freq: Four times a day (QID) | ORAL | Status: AC
  Administered 2016-07-12 (×2): 40 meq via ORAL
  Filled 2016-07-12 (×2): qty 2

## 2016-07-12 NOTE — Progress Notes (Signed)
PROGRESS NOTE                                                                                                                                                                                                             Patient Demographics:    Sharon Vasquez, is a 66 y.o. female, DOB - 11/26/1950, NKN:[id number]  Admit date - 07/11/2016   Admitting Physician Sharon Dickens, MD  Outpatient Primary MD for the patient is Pcp Not In System  LOS - 0  Chief Complaint  Patient presents with  . Shortness of Breath  . Emesis       Brief Narrative Sharon Vasquez is a very pleasant 66 y.o. female with medical history significant for hyperlipidemia, CHF, COPD not on home oxygen, A. fib morbid obesity peripheral vascular disease PE emergency Department chief complaint of persistent worsening shortness of breath and diarrhea.   Subjective:    Sharon Vasquez today has, No headache, No chest pain, No abdominal pain - No Nausea, No new weakness tingling or numbness, No Cough - improved SOB.     Assessment  & Plan :     1.Acute on chronic CHF nonspecific. No echo on file. Echo ordered monitor results, much better on IV Lasix which will be continued, blood pressure too low for beta blocker or ACE inhibitor. Placed on salt and fluid restriction. Monitor intake and output and daily weights. She is now down to room air.  2. Diarrhea. Likely viral gastroenteritis. Almost completely resolved monitor.  3. History of PAD, bilateral lymphedema. Improving with left leg Ace wrap and diuretics continue. Her vascular surgeon is in [place] and she wants to follow-up with him after discharge.  4. Chronic A. fib [place] vasc 2 score of 4. On xaralto continue.  5. Nonspecific chest pain upon admission with Mild elevation of troponin upon admission, trend was flat, currently chest pain-free, obtain echocardiogram to evaluate wall motion and EF. She does have  some nonspecific T-wave inversions on her EKG. We'll also request cardiology to evaluate her. On xaralto for now will not add aspirin. Blood pressure too low for beta blocker.    6. COPD. No wheezing on exam start tapering steroids. Continue supportive care.  7. Mild left leg erythema. Likely due to chronic lymphedema, minimal warmth, on Keflex will give 5 day trial .  8. Hypertension. Blood pressure is soft likely due to combination of Lasix and diarrhea, currently home dose ACE inhibitor on hold.    Diet : Diet Heart Room service appropriate? Yes; Fluid consistency: Thin    Family Communication  :  None  Code Status :  Full  Disposition Plan  :  Likely home 1-2 days  Consults  :  Cardiology  Procedures  :    TTE  DVT Prophylaxis  :  Xaralto  Lab Results  Component Value Date   PLT 354 07/12/2016    Inpatient Medications  Scheduled Meds: . aspirin EC  81 mg Oral Daily  . cephALEXin  500 mg Oral Q6H  . furosemide  40 mg Intravenous BID  . ipratropium-albuterol  3 mL Nebulization TID  . methylPREDNISolone (SOLU-MEDROL) injection  60 mg Intravenous Q12H  . pantoprazole  40 mg Oral Daily  . [START ON 07/13/2016] potassium chloride  40 mEq Oral Daily  . potassium chloride  40 mEq Oral Once  . rivaroxaban  20 mg Oral [id number]  . sodium chloride flush  3 mL Intravenous Q12H   Continuous Infusions: PRN Meds:.sodium chloride, acetaminophen, ALPRAZolam, ipratropium-albuterol, ondansetron (ZOFRAN) IV, sodium chloride flush  Antibiotics  :    Anti-infectives    Start     Dose/Rate Route Frequency Ordered Stop   07/11/16 1800  cephALEXin (KEFLEX) capsule 500 mg     500 mg Oral Every 6 hours 07/11/16 1532 07/16/16 1759         Objective:   Vitals:   07/11/16 2052 07/12/16 0112 07/12/16 0411 07/12/16 0827  BP: (!) 85/50 113/72 107/66   Pulse: 91 96 87   Resp: 16 16 20    Temp: 99.5 F (37.5 C) 98.5 F (36.9 C) 98 F (36.7 C)   TempSrc: Oral Oral Oral   SpO2: 91%  93% 94% 95%  Weight:   (!) 141.2 kg (311 lb 3.2 oz)   Height:        Wt Readings from Last 3 Encounters:  07/12/16 (!) 141.2 kg (311 lb 3.2 oz)     Intake/Output Summary (Last 24 hours) at 07/12/16 1128 Last data filed at 07/12/16 0956  Gross per 24 hour  Intake              600 ml  Output             1101 ml  Net             -501 ml     Physical Exam  Morbidly obese, AA female, Awake Alert, Oriented X 3, No new F.N deficits, Normal affect Sharon Vasquez,Sharon Vasquez Supple Neck,No JVD, No cervical lymphadenopathy appriciated.  Symmetrical Chest wall movement, Good air movement bilaterally, CTAB RRR,No Gallops,Rubs or new Murmurs, No Parasternal Heave +ve B.Sounds, Abd Soft, No tenderness, No organomegaly appriciated, No rebound - guarding or rigidity. No Cyanosis, Clubbing , 2-3+ edema, No new Rash or bruise       Data Review:    CBC  Recent Labs Lab 07/11/16 1149 07/12/16 0310  WBC 13.7* 13.6*  HGB 12.9 12.9  HCT 37.9 37.7  PLT 361 354  MCV 85.0 84.7  MCH 28.9 29.0  MCHC 34.0 34.2  RDW 14.0 14.1    Chemistries   Recent Labs Lab 07/11/16 1101 07/12/16 0310  NA 139 138  K 3.6 3.2*  CL 107 106  CO2 19* 21*  GLUCOSE 117* 188*  BUN 8 16  CREATININE 1.01* 0.98  CALCIUM 9.0 8.8*  AST 19  --   ALT 14  --   ALKPHOS 85  --   BILITOT 1.4*  --    ------------------------------------------------------------------------------------------------------------------ No results for input(s): CHOL, HDL, LDLCALC, TRIG, CHOLHDL, LDLDIRECT in the last 72 hours.  No results found for: HGBA1C ------------------------------------------------------------------------------------------------------------------ No results for input(s): TSH, T4TOTAL, T3FREE, THYROIDAB in the last 72 hours.  Invalid input(s): FREET3 ------------------------------------------------------------------------------------------------------------------ No results for input(s): VITAMINB12, FOLATE, FERRITIN,  TIBC, IRON, RETICCTPCT in the last 72 hours.  Coagulation profile No results for input(s): INR, PROTIME in the last 168 hours.  No results for input(s): DDIMER in the last 72 hours.  Cardiac Enzymes  Recent Labs Lab 07/11/16 1132 07/11/16 1846 07/11/16 2205  TROPONINI 0.06* 0.05* 0.07*   ------------------------------------------------------------------------------------------------------------------    Component Value Date/Time   BNP 527.1 (H) 07/11/2016 1132    Micro Results Recent Results (from the past 240 hour(s))  Culture, blood (routine x 2)     Status: None (Preliminary result)   Collection Time: 07/11/16  4:38 PM  Result Value Ref Range Status   Specimen Description BLOOD LEFT HAND  Final   Special Requests IN PEDIATRIC BOTTLE Blood Culture adequate volume  Final   Culture NO GROWTH < 24 HOURS  Final   Report Status PENDING  Incomplete  Culture, blood (routine x 2)     Status: None (Preliminary result)   Collection Time: 07/11/16  4:38 PM  Result Value Ref Range Status   Specimen Description BLOOD RIGHT HAND  Final   Special Requests   Final    BOTTLES DRAWN AEROBIC ONLY Blood Culture results may not be optimal due to an inadequate volume of blood received in culture bottles   Culture NO GROWTH < 24 HOURS  Final   Report Status PENDING  Incomplete    Radiology Reports Dg Chest Port 1 View  Result Date: 07/11/2016 CLINICAL DATA:  Shortness of breath, abdominal pain for 5 days, smoker EXAM: PORTABLE CHEST 1 VIEW COMPARISON:  12/09/2015 FINDINGS: Cardiomegaly again noted. No infiltrate or pleural effusion. No pulmonary edema. Degenerative changes right acromioclavicular joint. IMPRESSION: No active disease.  Cardiomegaly again noted. Electronically Signed   By: Lahoma Crocker M.D.   On: 07/11/2016 14:15    Time Spent in minutes  30   Lala Lund M.D on 07/12/2016 at 11:28 AM  Between 7am to 7pm - Pager - [phone] ( page via [facility], text pages only, please  mention full 10 digit call back number). After 7pm go to [email or web address] - password [facility]

## 2016-07-12 NOTE — Progress Notes (Signed)
Patient's family took patient off unit to see granddaughter in the parking lot. Patient informed nurse of intention to do so. Nurse informed patient that she would page doctor to get permission to go off unit. Family took patient off unit against staff suggestion to wait and inform doctor.

## 2016-07-12 NOTE — Consult Note (Signed)
Reason for Consult:   AF, CHF  Requesting Physician: Dr Candiss Norse Primary Cardiologist Dr Gwenlyn Found (new)  HPI:   Sharon Vasquez is a 66 y.o. female who is being seen today for the evaluation of atrial fibrillation and diastolic CHFat the request of Dr Candiss Norse.  66 y/o morbidly obese, AA female, admitted 07/11/16 with dyspnea, found to be in AF, COPD exacerbation, LE cellulitis,and diastolic CHF. The pt has a history past admissions in [place] for diastolic CHF. She was admitted pulmonary embolism and LLE DVT in [place] June 2016. During that admission she also had PAF. An echo then showed low normal LVF (incidentally no pulmonary HTN). She was placed on Amiodarone for a few weeks and maintained NSR and this was stopped. She was also placed on Xarelto which she says "I never miss a dose". In Feb 2017 she was in NSR when she saw her cardiologist in [place]. An EKG from June 2017 also shows NSR, her BNP was 89 in June as well.  She had been in [place] last week and developed uncontrollable diarrhea and urinary incontinence. This lasted for 3-4 days and she returned to [place] earlier this week. Since her return she has become increasingly SOB and weak. In the ED her EKG shows AF with VR 90's. CXR mild CHF. BNP 527. She is unaware of her HR being irregular.      PMHx:  Past Medical History:  Diagnosis Date  . A-fib ([place])   . CHF (congestive heart failure) ([place])   . COPD (chronic obstructive pulmonary disease) ([place])   . Hyperlipidemia   . Hypertension   . PE (pulmonary embolism)     Past Surgical History:  Procedure Laterality Date  . ABDOMINAL HYSTERECTOMY    . APPENDECTOMY    . TONSILLECTOMY      SOCHx:  reports that she has been smoking.  She has never used smokeless tobacco. She reports that she does not drink alcohol or use drugs.  FAMHx: Family History  Problem Relation Age of Onset  . Breast cancer Mother   . Hypertension Mother     ALLERGIES: No Known Allergies  ROS: Review of  Systems: General: negative for chills, fever, night sweats or weight changes.  Cardiovascular: negative for chest pain, palpitations, paroxysmal nocturnal dyspnea chronic venous edema LE, Lt> Rt  HEENT: negative for any visual disturbances, blindness, glaucoma, poor dentition, glasses Dermatological: negative for rash, h/o staph Rt breast secondary to EKG leads in June 2016 Respiratory: See HPI- dyspnea, cough Abdominal: negative for nausea, vomiting, diarrhea, bright red blood per rectum, melena, or hematemesis,diarrhea and urinary incontinence for the past 3-4 days Neurologic: negative for visual changes, syncope, or dizziness Musculoskeletal: negative for back pain, joint pain, or swelling Psych: cooperative and appropriate All other systems reviewed and are otherwise negative except as noted above.   HOME MEDICATIONS: Prior to Admission medications   Medication Sig Start Date End Date Taking? Authorizing Provider  amLODipine-benazepril (LOTREL) 10-40 MG capsule Take 1 capsule by mouth daily.    Yes Historical Provider, MD  aspirin EC 81 MG tablet Take 81 mg by mouth daily.   Yes Historical Provider, MD  celecoxib (CELEBREX) 200 MG capsule Take 200 mg by mouth 2 (two) times daily as needed (inflammation).   Yes Historical Provider, MD  esomeprazole (NEXIUM) 40 MG capsule Take 40 mg by mouth daily as needed (for heartburn or indigestion).   Yes Historical Provider, MD  gabapentin (NEURONTIN) 400 MG  capsule Take 400 mg by mouth 2 (two) times daily.    Yes Historical Provider, MD  potassium chloride (K-DUR,KLOR-CON) 10 MEQ tablet Take 10 mEq by mouth 2 (two) times daily.   Yes Historical Provider, MD  rivaroxaban (XARELTO) 20 MG TABS tablet Take 20 mg by mouth every morning.    Yes Historical Provider, MD    HOSPITAL MEDICATIONS: I have reviewed the patient's current medications.  VITALS: Blood pressure 124/69, pulse 90, temperature 98.2 F (36.8 C), temperature source Oral, resp. rate  18, height 6\' 2"  (1.88 m), weight (!) 311 lb 3.2 oz (141.2 kg), SpO2 96 %.  PHYSICAL EXAM: General appearance: alert, cooperative, no distress and morbidly obese Neck: no carotid bruit and no JVD Lungs: clear to auscultation bilaterally and no wheezing noted Heart: irregularly irregular rhythm Abdomen: morbid obesity Extremities: 1+ RLE edema, LLE has Una boot in place Pulses: diminnished Skin: Skin color, texture, turgor normal. No rashes or lesions Neurologic: Grossly normal  LABS: Results for orders placed or performed during the hospital encounter of 07/11/16 (from the past 24 hour(s))  Lactic acid, plasma     Status: Abnormal   Collection Time: 07/11/16  4:38 PM  Result Value Ref Range   Lactic Acid, Venous 2.3 (HH) 0.5 - 1.9 mmol/L  Culture, blood (routine x 2)     Status: None (Preliminary result)   Collection Time: 07/11/16  4:38 PM  Result Value Ref Range   Specimen Description BLOOD LEFT HAND    Special Requests IN PEDIATRIC BOTTLE Blood Culture adequate volume    Culture NO GROWTH < 24 HOURS    Report Status PENDING   Culture, blood (routine x 2)     Status: None (Preliminary result)   Collection Time: 07/11/16  4:38 PM  Result Value Ref Range   Specimen Description BLOOD RIGHT HAND    Special Requests      BOTTLES DRAWN AEROBIC ONLY Blood Culture results may not be optimal due to an inadequate volume of blood received in culture bottles   Culture NO GROWTH < 24 HOURS    Report Status PENDING   Troponin I     Status: Abnormal   Collection Time: 07/11/16  6:46 PM  Result Value Ref Range   Troponin I 0.05 (HH) <0.03 ng/mL  Lactic acid, plasma     Status: None   Collection Time: 07/11/16  6:46 PM  Result Value Ref Range   Lactic Acid, Venous 1.7 0.5 - 1.9 mmol/L  Troponin I     Status: Abnormal   Collection Time: 07/11/16 10:05 PM  Result Value Ref Range   Troponin I 0.07 (HH) <0.03 ng/mL  Basic metabolic panel     Status: Abnormal   Collection Time: 07/12/16   3:10 AM  Result Value Ref Range   Sodium 138 135 - 145 mmol/L   Potassium 3.2 (L) 3.5 - 5.1 mmol/L   Chloride 106 101 - 111 mmol/L   CO2 21 (L) 22 - 32 mmol/L   Glucose, Bld 188 (H) 65 - 99 mg/dL   BUN 16 6 - 20 mg/dL   Creatinine, Ser 0.98 0.44 - 1.00 mg/dL   Calcium 8.8 (L) 8.9 - 10.3 mg/dL   GFR calc non Af Amer 59 (L) >60 mL/min   GFR calc Af Amer >60 >60 mL/min   Anion gap 11 5 - 15  CBC     Status: Abnormal   Collection Time: 07/12/16  3:10 AM  Result Value Ref Range  WBC 13.6 (H) 4.0 - 10.5 K/uL   RBC 4.45 3.87 - 5.11 MIL/uL   Hemoglobin 12.9 12.0 - 15.0 g/dL   HCT 37.7 36.0 - 46.0 %   MCV 84.7 78.0 - 100.0 fL   MCH 29.0 26.0 - 34.0 pg   MCHC 34.2 30.0 - 36.0 g/dL   RDW 14.1 11.5 - 15.5 %   Platelets 354 150 - 400 K/uL    EKG: AF with VR 90  IMAGING: Dg Chest Port 1 View  Result Date: 07/11/2016 CLINICAL DATA:  Shortness of breath, abdominal pain for 5 days, smoker EXAM: PORTABLE CHEST 1 VIEW COMPARISON:  12/09/2015 FINDINGS: Cardiomegaly again noted. No infiltrate or pleural effusion. No pulmonary edema. Degenerative changes right acromioclavicular joint. IMPRESSION: No active disease.  Cardiomegaly again noted. Electronically Signed   By: Lahoma Crocker M.D.   On: 07/11/2016 14:15    IMPRESSION: Principal Problem:   Acute respiratory distress Suspect this was a combination of acute on diastolic CHF and COPD exacerbation    PAF (paroxysmal atrial fibrillation) (HCC) Recurrent AF, she was in NSR in Sept 2017. Her AF probably contributed to her diastolic CHF. Her CHA2DS2-VASc score is 5.    Chronic anticoagulation Pt is on Xarelto and says she does not miss doses of this.     COPD exacerbation ([place]) Per primary team, no wheezing on exam   History of pulmonary embolism H/O unprovoked PE and DVT Feb 2016 in [place]. She has been on Xarelto since. No pulmonary HTN on her last echo June 2016    Venous stasis ulcer with edema of lower leg  Pt has a history of Lt CF vein  and Lt iliac vein stenting in [place] Oct 2017   Morbid obesity  BMI 41.2. She says she had a sleep study 4 years ago that was negative, but her family has told her she snores.    Diarrhea and urinary incontinence  BC negative x1   RECOMMENDATION: Discussed with Dr Gwenlyn Found- add Amiodarone, possible short term, diurese with IV Lasix, replace electrolytes, document TSH, echo pending. We will follow.   Time Spent Directly with Patient: 50 minutes  Kerin Ransom, [place] beeper 07/12/2016, 1:51 PM   Agree with note written by Kerin Ransom [facility]  Asked to see Ms. Warmack by the primary care team for occasional atrial fibrillation and diastolic heart failure. She is morbidly obese, history of prior pulmonary embolism with PAF at that time on Xarelto oral anticoagulation since that time. She's had left common femoral vein stenting and venous hypertension with venous stasis. She also has COPD. She was admitted with increasing shortness of breath. This is mostly multifactorial related to diastolic heart failure and COPD. Her BNP was moderately elevated. She did have episodes of PAF currently in sinus rhythm although she has maintained her oral anticoagulation. She was rate controlled. She has been on amiodarone briefly in the past. Her exam was benign. Her left leg is wrapped because of venous ulcer. I recommend initiating amiodarone 200 mg by mouth twice a day, IV diuresis and 2-D echocardiogram. We will follow with you  Quay Burow 07/12/2016 3:20 PM

## 2016-07-12 NOTE — Progress Notes (Signed)
  Echocardiogram 2D Echocardiogram has been performed.  Sharon Vasquez T Sharon Vasquez 07/12/2016, 10:56 AM

## 2016-07-12 NOTE — Progress Notes (Signed)
Patient is back on unit.

## 2016-07-12 NOTE — Consult Note (Addendum)
WOC consult requested for LLE.  Pt was wearing Una boots prior to admission, according to the EMR. Dyanne Carrel, NP removed dressing and assessed wound and leg appearance; refer to her progress note for further description, then ordered Ardelia Mems boot be reapplied, which ortho tech performed on 4/10.  WOC will remove Una boot and re-assess LLE on Fri, 4/13 if patient is still in the hospital at that time, since compression wrap was just applied yesterday. Julien Girt MSN, RN, [place], [place], [place]

## 2016-07-13 DIAGNOSIS — Z7901 Long term (current) use of anticoagulants: Secondary | ICD-10-CM

## 2016-07-13 DIAGNOSIS — I10 Essential (primary) hypertension: Secondary | ICD-10-CM

## 2016-07-13 DIAGNOSIS — I481 Persistent atrial fibrillation: Secondary | ICD-10-CM

## 2016-07-13 DIAGNOSIS — R609 Edema, unspecified: Secondary | ICD-10-CM

## 2016-07-13 DIAGNOSIS — R0603 Acute respiratory distress: Secondary | ICD-10-CM

## 2016-07-13 DIAGNOSIS — I480 Paroxysmal atrial fibrillation: Secondary | ICD-10-CM | POA: Diagnosis not present

## 2016-07-13 DIAGNOSIS — J441 Chronic obstructive pulmonary disease with (acute) exacerbation: Secondary | ICD-10-CM | POA: Diagnosis not present

## 2016-07-13 DIAGNOSIS — Z86711 Personal history of pulmonary embolism: Secondary | ICD-10-CM

## 2016-07-13 DIAGNOSIS — I83009 Varicose veins of unspecified lower extremity with ulcer of unspecified site: Secondary | ICD-10-CM

## 2016-07-13 LAB — BASIC METABOLIC PANEL
Anion gap: 7 (ref 5–15)
BUN: 23 mg/dL — ABNORMAL HIGH (ref 6–20)
CO2: 23 mmol/L (ref 22–32)
Calcium: 8.8 mg/dL — ABNORMAL LOW (ref 8.9–10.3)
Chloride: 109 mmol/L (ref 101–111)
Creatinine, Ser: 0.99 mg/dL (ref 0.44–1.00)
GFR calc Af Amer: >60 mL/min (ref >60)
GFR calc non Af Amer: 58 mL/min — ABNORMAL LOW (ref >60)
Glucose, Bld: 157 mg/dL — ABNORMAL HIGH (ref 65–99)
Potassium: 3.7 mmol/L (ref 3.5–5.1)
Sodium: 139 mmol/L (ref 135–145)

## 2016-07-13 LAB — CBC
HCT: 35.8 % — ABNORMAL LOW (ref 36.0–46.0)
Hemoglobin: 12.1 g/dL (ref 12.0–15.0)
MCH: 28.7 pg (ref 26.0–34.0)
MCHC: 33.8 g/dL (ref 30.0–36.0)
MCV: 84.8 fL (ref 78.0–100.0)
Platelets: 415 10*3/uL — ABNORMAL HIGH (ref 150–400)
RBC: 4.22 MIL/uL (ref 3.87–5.11)
RDW: 14.4 % (ref 11.5–15.5)
WBC: 15.1 10*3/uL — ABNORMAL HIGH (ref 4.0–10.5)

## 2016-07-13 LAB — URINALYSIS, ROUTINE W REFLEX MICROSCOPIC
Bilirubin Urine: NEGATIVE
Glucose, UA: NEGATIVE mg/dL
Hgb urine dipstick: NEGATIVE
Ketones, ur: NEGATIVE mg/dL
Leukocytes, UA: NEGATIVE
Nitrite: NEGATIVE
Protein, ur: NEGATIVE mg/dL
Specific Gravity, Urine: 1.013 (ref 1.005–1.030)
pH: 6 (ref 5.0–8.0)

## 2016-07-13 LAB — MAGNESIUM: Magnesium: 1.7 mg/dL (ref 1.7–2.4)

## 2016-07-13 LAB — TSH: TSH: 0.216 u[IU]/mL — ABNORMAL LOW (ref 0.350–4.500)

## 2016-07-13 MED ORDER — FUROSEMIDE 20 MG PO TABS: 20.0000 mg | ORAL_TABLET | Freq: Every day | ORAL | 0 refills | Status: DC

## 2016-07-13 MED ORDER — ALBUTEROL SULFATE (2.5 MG/3ML) 0.083% IN NEBU: 2.5000 mg | INHALATION_SOLUTION | RESPIRATORY_TRACT | 0 refills | Status: DC | PRN

## 2016-07-13 MED ORDER — AMIODARONE HCL 200 MG PO TABS: 200.0000 mg | ORAL_TABLET | Freq: Two times a day (BID) | ORAL | 0 refills | Status: DC

## 2016-07-13 NOTE — Care Management Note (Signed)
Case Management Note  Patient Details  Name: ROGAN ECKLUND [name]: [id number] Date of Birth: 1951-02-19  Subjective/Objective: Admitted with Acute Resp Distress                  Action/Plan: Patient lives in [place] for several years; she also travels back and forth to [place]; she plas to follow up with [facility]; has private insurance with [facility] with prescription drug coverage; pharmacy of choice is [facility]; DME- she has a Electronics engineer, walker and scooter at home; [facility] choices offered, patient chose [facility]; Mary with [facility] called for arrangements; CM will continue to follow for DCP  Expected Discharge Date:  07/14/16               Expected Discharge Plan:  [place]  Discharge planning Services  CM Consult  Choice offered to:  Patient    HH Arranged:  RN, PT [facility] Agency:  [facility]  Status of Service:  In process, will continue to follow  Sherrilyn Rist [phone] 07/13/2016, 11:04 AM

## 2016-07-13 NOTE — Discharge Summary (Addendum)
Physician Discharge Summary  Sharon Vasquez:[id number] DOB: 08/25/1950 DOA: 07/11/2016  PCP: Pcp Not In System  Admit date: 07/11/2016 Discharge date: 07/13/2016  Admitted From: Home  Disposition:  Home   Recommendations for Outpatient Follow-up:  1. Follow up with PCP in 1-week 2. Patient placed on amiodarone 200 mg bid to improve atrial fibrillation rate control, plan to decrease to 200 mg daily as outpatient.     Home Health: No  Equipment/Devices: No/ Home walker   Discharge Condition: Stable  CODE STATUS: Full  Diet recommendation: Heart Healthy / Carb Modified    Brief/Interim Summary: This is a 66 year old female who presented to hospital with the chief complaint of dyspnea, worsening lower extremity edema, nausea and vomiting. Worsening symptoms for last 3 days prior to hospitalization associated with generalized weakness. On initial evaluation she was found to be in respiratory distress with a respiratory rate of 31 bpm, heart rate 99, blood pressure 103/56, oxygen saturation 97% oxygen on supplemental oxygen per nasal cannula. S1-S2 present, irregularly irregular, positive edema to 3+. Unna boot in place in the left lower extremity, positive venous stasis changes. Sodium 139, potassium 3.6, Cordarone 7, bicarbonate 19, close 117, BUN 8, creatinine 1.0, calcium 9.0, AST 19, ALT 14, BNP 527, white count 13.7, hemoglobin 12.9, hematocrit 37.9, platelets 361, EKG with atrial fibrillation with inferior lateral T-wave inversions. Chest x-ray with bilateral interstitial infiltrates, cardiomegaly. Urinalysis negative for infection.  The patient was admitted to hospital working diagnosis of respiratory distress/ impending respiratory failure due to decompensated heart failure, diastolic, acute on chronic.  1. Respiratory distress due to decompensated diastolic heart failure. Acute on chronic. Patient was admitted to the hospital, remote telemetry monitor, intravenous diuresis with  furosemide. Patient responded well to medical therapy, negative fluid balance was achieved, -1221 ml. By the time of discharge, oxygen saturation 94 to 97% on room air. Echocardiography with ejection fraction 60 to 65%, mild concentric hypertrophy. Patient will continue blood pressure control with amlodipine-benazepril. Follow-up as an outpatient, may be candidate for B bloker. Patient did have episodes of hypotension during this hospitalization. Continue furosemide for symptom control.  2. Chronic atrial fibrillation. Uncontrolled rate, patient was seen by cardiology and patient was started on amiodarone 200 mg bid with plan to decrease to 200 mg daily as outpatient. Will need close follow up as outpatient.   3. Peripheral vascular disease. Continue [place], no signs of infection, antibiotics will be discontinued.  4. COPD, suspected acute exacerbation. Patient received systemic steroids and bronchodilator therapy. No wheezing at the time of discharge.  5. Hypertension. Patient had aggressive diuresis, positive hypotension. Antihypertensive agents were held. Discharge blood pressure 144/97, amlodipine- benazepril will be resumed.  6. History of pulmonary embolism. Patient will continue on apixaban for anticoagulation.      Discharge Diagnoses:  Principal Problem:   Acute respiratory distress Active Problems:   Morbid obesity due to excess calories (HCC)   Benign essential HTN   COPD exacerbation (HCC)   Elevated troponin   Chest pain   History of pulmonary embolism   Cellulitis   SIRS (systemic inflammatory response syndrome) (HCC)   PAF (paroxysmal atrial fibrillation) (HCC)   Tobacco use   Acute on chronic combined systolic and diastolic CHF (congestive heart failure) (HCC)   Venous stasis ulcer with edema of lower leg (HCC)   Chronic anticoagulation    Discharge Instructions   Allergies as of 07/13/2016   No Known Allergies     Medication List    TAKE  these medications    albuterol (2.5 MG/3ML) 0.083% nebulizer solution Commonly known as:  PROVENTIL Take 3 mLs (2.5 mg total) by nebulization every 4 (four) hours as needed for wheezing or shortness of breath.   amiodarone 200 MG tablet Commonly known as:  PACERONE Take 1 tablet (200 mg total) by mouth 2 (two) times daily.   amLODipine-benazepril 10-40 MG capsule Commonly known as:  LOTREL Take 1 capsule by mouth daily.   aspirin EC 81 MG tablet Take 81 mg by mouth daily.   celecoxib 200 MG capsule Commonly known as:  CELEBREX Take 200 mg by mouth 2 (two) times daily as needed (inflammation).   esomeprazole 40 MG capsule Commonly known as:  NEXIUM Take 40 mg by mouth daily as needed (for heartburn or indigestion).   furosemide 20 MG tablet Commonly known as:  LASIX Take 1 tablet (20 mg total) by mouth daily.   gabapentin 400 MG capsule Commonly known as:  NEURONTIN Take 400 mg by mouth 2 (two) times daily.   potassium chloride 10 MEQ tablet Commonly known as:  K-DUR,KLOR-CON Take 10 mEq by mouth 2 (two) times daily.   rivaroxaban 20 MG Tabs tablet Commonly known as:  XARELTO Take 20 mg by mouth every morning.      Follow-up Information    Almyra Deforest, [place] Follow up on 07/26/2016.   Specialties:  Cardiology, Radiology Why:  Please arrive at 1:45 pm for a 2:00 pm appt. Contact information: [address] [place] [address] [phone]        [facility] Follow up.   Specialty:  Home Health Services Why:  They will do your home health care at your home Contact information: [place] [address] [phone]          No Known Allergies  Consultations:  Cardiology    Procedures/Studies: Dg Chest Port 1 View  Result Date: 07/11/2016 CLINICAL DATA:  Shortness of breath, abdominal pain for 5 days, smoker EXAM: PORTABLE CHEST 1 VIEW COMPARISON:  12/09/2015 FINDINGS: Cardiomegaly again noted. No infiltrate or pleural effusion. No  pulmonary edema. Degenerative changes right acromioclavicular joint. IMPRESSION: No active disease.  Cardiomegaly again noted. Electronically Signed   By: Lahoma Crocker M.D.   On: 07/11/2016 14:15       Subjective: Patient feeling better, dyspnea has improved, no nausea or vomiting. Ambulating.   Discharge Exam: Vitals:   07/13/16 0433 07/13/16 1014  BP: 126/79 (!) 144/97  Pulse: 80 96  Resp: 18   Temp: 98 F (36.7 C)    Vitals:   07/12/16 1225 07/12/16 2047 07/13/16 0433 07/13/16 1014  BP: 124/69 113/70 126/79 (!) 144/97  Pulse: 90 97 80 96  Resp: 18 18 18    Temp: 98.2 F (36.8 C) 97.8 F (36.6 C) 98 F (36.7 C)   TempSrc: Oral Oral Oral   SpO2: 96% 96% 97% 94%  Weight:   (!) 141.1 kg (311 lb 1.6 oz)   Height:        General: Pt is alert, awake, not in acute distress Cardiovascular: irregulary irregular, S1/S2 +, no rubs, no gallops Respiratory: CTA bilaterally, no wheezing, no rhonchi Abdominal: Soft, NT, ND, bowel sounds + Extremities: no edema, no cyanosis. Left lower extremity with unna boot in place. Not reported skin lesions or ulcers.     The results of significant diagnostics from this hospitalization (including imaging, microbiology, ancillary and laboratory) are listed below for reference.  Microbiology: Recent Results (from the past 240 hour(s))  Culture, blood (routine x 2)     Status: None (Preliminary result)   Collection Time: 07/11/16  4:38 PM  Result Value Ref Range Status   Specimen Description BLOOD LEFT HAND  Final   Special Requests IN PEDIATRIC BOTTLE Blood Culture adequate volume  Final   Culture NO GROWTH < 24 HOURS  Final   Report Status PENDING  Incomplete  Culture, blood (routine x 2)     Status: None (Preliminary result)   Collection Time: 07/11/16  4:38 PM  Result Value Ref Range Status   Specimen Description BLOOD RIGHT HAND  Final   Special Requests   Final    BOTTLES DRAWN AEROBIC ONLY Blood Culture results may not be optimal  due to an inadequate volume of blood received in culture bottles   Culture NO GROWTH < 24 HOURS  Final   Report Status PENDING  Incomplete     Labs: BNP (last 3 results)  Recent Labs  09/23/15 1045 07/11/16 1132  BNP 89.4 096.0*   Basic Metabolic Panel:  Recent Labs Lab 07/11/16 1101 07/12/16 0310 07/13/16 0250  NA 139 138 139  K 3.6 3.2* 3.7  CL 107 106 109  CO2 19* 21* 23  GLUCOSE 117* 188* 157*  BUN 8 16 23*  CREATININE 1.01* 0.98 0.99  CALCIUM 9.0 8.8* 8.8*  MG  --   --  1.7   Liver Function Tests:  Recent Labs Lab 07/11/16 1101  AST 19  ALT 14  ALKPHOS 85  BILITOT 1.4*  PROT 6.9  ALBUMIN 3.6    Recent Labs Lab 07/11/16 1101  LIPASE <10*   No results for input(s): AMMONIA in the last 168 hours. CBC:  Recent Labs Lab 07/11/16 1149 07/12/16 0310 07/13/16 0250  WBC 13.7* 13.6* 15.1*  HGB 12.9 12.9 12.1  HCT 37.9 37.7 35.8*  MCV 85.0 84.7 84.8  PLT 361 354 415*   Cardiac Enzymes:  Recent Labs Lab 07/11/16 1132 07/11/16 1846 07/11/16 2205  TROPONINI 0.06* 0.05* 0.07*   BNP: Invalid input(s): POCBNP CBG: No results for input(s): GLUCAP in the last 168 hours. D-Dimer No results for input(s): DDIMER in the last 72 hours. Hgb A1c No results for input(s): HGBA1C in the last 72 hours. Lipid Profile No results for input(s): CHOL, HDL, LDLCALC, TRIG, CHOLHDL, LDLDIRECT in the last 72 hours. Thyroid function studies  Recent Labs  07/13/16 0250  TSH 0.216*   Anemia work up No results for input(s): VITAMINB12, FOLATE, FERRITIN, TIBC, IRON, RETICCTPCT in the last 72 hours. Urinalysis    Component Value Date/Time   COLORURINE YELLOW 07/13/2016 0840   APPEARANCEUR CLEAR 07/13/2016 0840   LABSPEC 1.013 07/13/2016 0840   PHURINE 6.0 07/13/2016 0840   GLUCOSEU NEGATIVE 07/13/2016 0840   HGBUR NEGATIVE 07/13/2016 0840   BILIRUBINUR NEGATIVE 07/13/2016 0840   KETONESUR NEGATIVE 07/13/2016 0840   PROTEINUR NEGATIVE 07/13/2016 0840    NITRITE NEGATIVE 07/13/2016 0840   LEUKOCYTESUR NEGATIVE 07/13/2016 0840   Sepsis Labs Invalid input(s): PROCALCITONIN,  WBC,  LACTICIDVEN Microbiology Recent Results (from the past 240 hour(s))  Culture, blood (routine x 2)     Status: None (Preliminary result)   Collection Time: 07/11/16  4:38 PM  Result Value Ref Range Status   Specimen Description BLOOD LEFT HAND  Final   Special Requests IN PEDIATRIC BOTTLE Blood Culture adequate volume  Final   Culture NO GROWTH < 24 HOURS  Final   Report Status  PENDING  Incomplete  Culture, blood (routine x 2)     Status: None (Preliminary result)   Collection Time: 07/11/16  4:38 PM  Result Value Ref Range Status   Specimen Description BLOOD RIGHT HAND  Final   Special Requests   Final    BOTTLES DRAWN AEROBIC ONLY Blood Culture results may not be optimal due to an inadequate volume of blood received in culture bottles   Culture NO GROWTH < 24 HOURS  Final   Report Status PENDING  Incomplete     Time coordinating discharge: 45 minutes  SIGNED:   Tawni Millers, MD  [facility] 07/13/2016, 11:17 AM Pager   If 7PM-7AM, please contact night-coverage [email or web address] Password [email or web address]

## 2016-07-13 NOTE — Progress Notes (Signed)
Progress Note  Patient Name: Sharon Vasquez Date of Encounter: 07/13/2016  Primary Cardiologist: [place], Dr Gwenlyn Found  Patient Profile     66 y.o. female w/ hx COPD, HTN, HLD, PAF, CHF, PAD, PE, previous care in [place] state, was admitted 04/10 W/ sob, nausea, diarrhea, LLE cellulitis. Cards saw 04/11 for CHF and afib  Subjective   Feels great, wants to go home. Says is watching salt more carefully now. No awareness of afib. Never light-headed or dizzy  Says has pressing business to she won't lose apt and has to leave today  Inpatient Medications    Scheduled Meds: . amiodarone  200 mg Oral BID  . aspirin EC  81 mg Oral Daily  . cephALEXin  500 mg Oral Q6H  . furosemide  40 mg Intravenous BID  . ipratropium-albuterol  3 mL Nebulization TID  . methylPREDNISolone (SOLU-MEDROL) injection  60 mg Intravenous Daily  . pantoprazole  40 mg Oral Daily  . potassium chloride  40 mEq Oral Daily  . rivaroxaban  20 mg Oral [id number]  . sodium chloride flush  3 mL Intravenous Q12H   Continuous Infusions:  PRN Meds: sodium chloride, acetaminophen, ALPRAZolam, ipratropium-albuterol, ondansetron (ZOFRAN) IV, sodium chloride flush   Vital Signs    Vitals:   07/12/16 0827 07/12/16 1225 07/12/16 2047 07/13/16 0433  BP:  124/69 113/70 126/79  Pulse:  90 97 80  Resp:  18 18 18   Temp:  98.2 F (36.8 C) 97.8 F (36.6 C) 98 F (36.7 C)  TempSrc:  Oral Oral Oral  SpO2: 95% 96% 96% 97%  Weight:    (!) 311 lb 1.6 oz (141.1 kg)  Height:        Intake/Output Summary (Last 24 hours) at 07/13/16 0933 Last data filed at 07/13/16 2800  Gross per 24 hour  Intake             1080 ml  Output             1500 ml  Net             -420 ml   Filed Weights   07/11/16 1706 07/12/16 0411 07/13/16 0433  Weight: (!) 310 lb 6.5 oz (140.8 kg) (!) 311 lb 3.2 oz (141.2 kg) (!) 311 lb 1.6 oz (141.1 kg)    Telemetry    Atrial fib, frequently RVR - Personally Reviewed  ECG    n/a - Personally  Reviewed  Physical Exam   General: Well developed, well nourished, female appearing in no acute distress. Head: Normocephalic, atraumatic.  Neck: Supple without bruits, JVD minimal elevation. Lungs:  Resp regular and unlabored, decreased BS  Bases w/ a few rales. Heart: Irreg R&R, S1, S2, no S3, S4, or murmur; no rub. Abdomen: Soft, non-tender, non-distended with normoactive bowel sounds. No hepatomegaly. No rebound/guarding. No obvious abdominal masses. Extremities: No clubbing, cyanosis, trace R edema. LLE wrapped, not disturbed.   Neuro: Alert and oriented X 3. Moves all extremities spontaneously. Psych: Normal affect.  Labs    Hematology Recent Labs Lab 07/11/16 1149 07/12/16 0310 07/13/16 0250  WBC 13.7* 13.6* 15.1*  RBC 4.46 4.45 4.22  HGB 12.9 12.9 12.1  HCT 37.9 37.7 35.8*  MCV 85.0 84.7 84.8  MCH 28.9 29.0 28.7  MCHC 34.0 34.2 33.8  RDW 14.0 14.1 14.4  PLT 361 354 415*    Chemistry Recent Labs Lab 07/11/16 1101 07/12/16 0310 07/13/16 0250  NA 139 138 139  K 3.6 3.2* 3.7  CL  107 106 109  CO2 19* 21* 23  GLUCOSE 117* 188* 157*  BUN 8 16 23*  CREATININE 1.01* 0.98 0.99  CALCIUM 9.0 8.8* 8.8*  PROT 6.9  --   --   ALBUMIN 3.6  --   --   AST 19  --   --   ALT 14  --   --   ALKPHOS 85  --   --   BILITOT 1.4*  --   --   GFRNONAA 57* 59* 58*  GFRAA >60 >60 >60  ANIONGAP 13 11 7      Cardiac Enzymes Recent Labs Lab 07/11/16 1132 07/11/16 1846 07/11/16 2205  TROPONINI 0.06* 0.05* 0.07*    BNP Recent Labs Lab 07/11/16 1132  BNP 527.1*     Radiology    Dg Chest Port 1 View  Result Date: 07/11/2016 CLINICAL DATA:  Shortness of breath, abdominal pain for 5 days, smoker EXAM: PORTABLE CHEST 1 VIEW COMPARISON:  12/09/2015 FINDINGS: Cardiomegaly again noted. No infiltrate or pleural effusion. No pulmonary edema. Degenerative changes right acromioclavicular joint. IMPRESSION: No active disease.  Cardiomegaly again noted. Electronically Signed   By:  Lahoma Crocker M.D.   On: 07/11/2016 14:15     Cardiac Studies   ECHO: 07/12/2016 - Left ventricle: The cavity size was normal. There was mild   concentric hypertrophy. Systolic function was normal. The   estimated ejection fraction was in the range of 60% to 65%. Wall   motion was normal; there were no regional wall motion abnormalities. - Aortic valve: Transvalvular velocity was within the normal range.   There was no stenosis. There was no regurgitation. - Mitral valve: Transvalvular velocity was within the normal range.   There was no evidence for stenosis. There was trivial regurgitation. - Left atrium: The atrium was moderately dilated. R atrium nl size - Right ventricle: The cavity size was normal. Wall thickness was     normal. Systolic function was normal. - Atrial septum: No defect or patent foramen ovale was identified   by color flow Doppler. - Tricuspid valve: There was mild regurgitation. - Pulmonary arteries: Systolic pressure was within the normal   range. PA peak pressure: 29 mm Hg (S).  Patient Profile     66 y.o. female w/ hx  COPD, HTN, HLD, PAF, D-CHF, PAD, PE, previous care in [place] state, was admitted 04/10 W/ sob, nausea, diarrhea, LLE cellulitis. Cards saw 04/11 for CHF and afib  Assessment & Plan    Principal Problem:   Acute respiratory distress Suspect this was a combination of acute on chronic diastolic CHF and COPD exacerbation    PAF (paroxysmal atrial fibrillation) (HCC) Recurrent AF, she was in NSR in Sept 2017. Her AF probably contributed to her diastolic CHF. Her CHA2DS2-VASc score is 5.  - she is on amio 200 mg qd now, new - afib rate is controlled.   Chronic anticoagulation Pt is on Xarelto and says she does not miss doses of this.     COPD exacerbation ([place]) Per primary team, no wheezing on exam   History of pulmonary embolism H/O unprovoked PE and DVT Feb 2016 in [place]. She has been on Xarelto since. No pulmonary HTN on her last echo  June 2016 - PAS 29 on echo this admit, no R heart strain   Venous stasis ulcer with edema of lower leg  Pt has a history of Lt CF vein and Lt iliac vein stenting in [place] Oct 2017   Morbid obesity  BMI 41.2. She says she had a sleep study 4 years ago that was negative, but her family has told her she snores.  - can discuss referral for sleep study as outpatient   Diarrhea and urinary incontinence  BC negative x1 - per IM  Otherwise, per IM. Please let us know if she goes today. Principal Problem:   Acute respiratory distress Active Problems:   Morbid obesity due to excess calories (HCC)   Benign essential HTN   COPD exacerbation (HCC)   Elevated troponin   Chest pain   History of pulmonary embolism   Cellulitis   SIRS (systemic inflammatory response syndrome) (HCC)   PAF (paroxysmal atrial fibrillation) (HCC)   Tobacco use   Acute on chronic combined systolic and diastolic CHF (congestive heart failure) (HCC)   Venous stasis ulcer with edema of lower leg ([place])   Chronic anticoagulation    Signed, Lenoard Aden 9:33 AM 07/13/2016 Pager: [phone]  Attending Note:   The patient was seen and examined.  Agree with assessment and plan as noted above.  Changes made to the above note as needed.  Patient seen and independently examined with Rosaria Ferries, PA .   We discussed all aspects of the encounter. I agree with the assessment and plan as stated above.  1. Persistent atrial fib:   Reportedly responded well to PO amio last month.   She stopped the amio and went back into AF. She has been restarted on amio. Rate is well controlled.   OK to DC to home on DOAC and amio 200 BID. Would have her see [place] in 1-2 weeks and plan decrease of amio to 200 mg a day at that point   2.    Chronic diastolic CHF:   Needs to watch her diet and to lose weight.  3. Hx of pulmonary embolus:   Continue xarelto.  4. Morbid obesity:   Advised weight loss.    I have spent a  total of 40 minutes with patient reviewing hospital  notes , telemetry, EKGs, labs and examining patient as well as establishing an assessment and plan that was discussed with the patient. > 50% of time was spent in direct patient care.    Thayer Headings, Brooke Bonito., MD, [facility] 07/13/2016, 11:15 AM [address]  [place] Pager [phone]

## 2016-07-13 NOTE — Progress Notes (Signed)
Discharge instructions reviewed with patient, questions answered, verbalized understanding.  Verified with MD that patient does not require antibiotics or steroids at discharge.  Patent transported to front of hospital via wheelchair to be taken home by daughter.

## 2016-07-13 NOTE — Plan of Care (Signed)
Problem: Skin Integrity: Goal: Risk for impaired skin integrity will decrease Outcome: Adequate for Discharge Patient has venous stasis ulcer, will be followed by home health

## 2016-07-16 LAB — CULTURE, BLOOD (ROUTINE X 2)
Culture: NO GROWTH
Culture: NO GROWTH
Special Requests: ADEQUATE

## 2016-07-17 ENCOUNTER — Ambulatory Visit (INDEPENDENT_AMBULATORY_CARE_PROVIDER_SITE_OTHER): Payer: Medicare Other | Admitting: Internal Medicine

## 2016-07-17 ENCOUNTER — Encounter: Payer: Self-pay | Admitting: Internal Medicine

## 2016-07-17 VITALS — BP 120/85 | HR 87 | Temp 97.8°F | Ht 74.0 in | Wt 321.0 lb

## 2016-07-17 DIAGNOSIS — R609 Edema, unspecified: Secondary | ICD-10-CM

## 2016-07-17 DIAGNOSIS — R197 Diarrhea, unspecified: Secondary | ICD-10-CM

## 2016-07-17 DIAGNOSIS — L97929 Non-pressure chronic ulcer of unspecified part of left lower leg with unspecified severity: Secondary | ICD-10-CM

## 2016-07-17 DIAGNOSIS — Z720 Tobacco use: Secondary | ICD-10-CM

## 2016-07-17 DIAGNOSIS — F17210 Nicotine dependence, cigarettes, uncomplicated: Secondary | ICD-10-CM

## 2016-07-17 DIAGNOSIS — I5030 Unspecified diastolic (congestive) heart failure: Secondary | ICD-10-CM

## 2016-07-17 DIAGNOSIS — I83009 Varicose veins of unspecified lower extremity with ulcer of unspecified site: Secondary | ICD-10-CM

## 2016-07-17 DIAGNOSIS — I700 Atherosclerosis of aorta: Secondary | ICD-10-CM | POA: Insufficient documentation

## 2016-07-17 DIAGNOSIS — R739 Hyperglycemia, unspecified: Secondary | ICD-10-CM

## 2016-07-17 DIAGNOSIS — I83029 Varicose veins of left lower extremity with ulcer of unspecified site: Secondary | ICD-10-CM | POA: Diagnosis not present

## 2016-07-17 DIAGNOSIS — Z86711 Personal history of pulmonary embolism: Secondary | ICD-10-CM | POA: Diagnosis not present

## 2016-07-17 DIAGNOSIS — Z803 Family history of malignant neoplasm of breast: Secondary | ICD-10-CM

## 2016-07-17 DIAGNOSIS — Z7901 Long term (current) use of anticoagulants: Secondary | ICD-10-CM

## 2016-07-17 DIAGNOSIS — J449 Chronic obstructive pulmonary disease, unspecified: Secondary | ICD-10-CM

## 2016-07-17 DIAGNOSIS — Z8249 Family history of ischemic heart disease and other diseases of the circulatory system: Secondary | ICD-10-CM

## 2016-07-17 DIAGNOSIS — I83899 Varicose veins of unspecified lower extremities with other complications: Secondary | ICD-10-CM

## 2016-07-17 DIAGNOSIS — I110 Hypertensive heart disease with heart failure: Secondary | ICD-10-CM

## 2016-07-17 DIAGNOSIS — Z8349 Family history of other endocrine, nutritional and metabolic diseases: Secondary | ICD-10-CM

## 2016-07-17 DIAGNOSIS — I5032 Chronic diastolic (congestive) heart failure: Secondary | ICD-10-CM

## 2016-07-17 DIAGNOSIS — I340 Nonrheumatic mitral (valve) insufficiency: Secondary | ICD-10-CM | POA: Diagnosis not present

## 2016-07-17 DIAGNOSIS — L97909 Non-pressure chronic ulcer of unspecified part of unspecified lower leg with unspecified severity: Secondary | ICD-10-CM

## 2016-07-17 DIAGNOSIS — I480 Paroxysmal atrial fibrillation: Secondary | ICD-10-CM | POA: Diagnosis not present

## 2016-07-17 DIAGNOSIS — I10 Essential (primary) hypertension: Secondary | ICD-10-CM

## 2016-07-17 DIAGNOSIS — Z6841 Body Mass Index (BMI) 40.0 and over, adult: Secondary | ICD-10-CM

## 2016-07-17 MED ORDER — FUROSEMIDE 40 MG PO TABS: 40.0000 mg | ORAL_TABLET | Freq: Every day | ORAL | 1 refills | Status: DC

## 2016-07-17 NOTE — Patient Instructions (Addendum)
Sharon Vasquez,  It was a pleasure meeting you today.   I am going to increase your dose of lasix to 40 mg daily today. I am going to check some labs today and get records from your other doctors.   We will try to get you set up with outpatient resources to get you a scale etc.   I would like you to weight yourself at home everyday once you get the scale. Please watch your fluid intake and try to drink less than 2L every day. Please monitor your salt intake as well.  You can take Immodium for your constipation.   Please follow up in clinic in 2-4 weeks.

## 2016-07-17 NOTE — Progress Notes (Signed)
CC: Establish care for COPD and dCHF  HPI:  Sharon Vasquez is a 66 y.o. female with a past medical history listed below here today to establish care for her COPD and dCHF.   For details of today's visit and the status of her chronic medical issues please refer to the assessment and plan.  Sharon Vasquez was recently admitted to the hospital from 4/10-4/12 for acute on chronic respiratory distress due to decompensated diastolic heart failure and COPD exacerbation. At time of admission she had complaints of worsening dyspnea, lower extremity edema, nausea and vomiting for 3 days. On initial exam she was in respiratory distress with respiratory rate of 31 breaths a minute, tachycardia with heart rate 99, satting 97% on supplemental oxygen by nasal cannula (unclear how much oxygen was required). She had 3+ pitting edema bilaterally. CXR had bilateral interstitial infiltrates and cardiomegaly. She was noted to be in a. Fib (chronic) with inferior T-wave inversions on EKG. She was diuresed with furosemide and responded well. Was able to come of supplemental oxygen and satting well on room air. ECHO was done which showed EF 60-65% with mild concentric hypertrophy. She was also treated for a COPD exacerbation with steroids and bronchodilator therapy. Discharged on lasix 20 mg daily and kdur 10 meq bid. Weight at discharge was 141 kgs.   Weight today is 145.6 kgs. Does not check her weight at home, reports she does not have a scale at home. Reports she had been on lasix 40 mg in the past. Does not feel that she is urinating as well. Breathing is at her baseline. Lower extremity swelling is doing well with stockings. Not monitoring her fluid intake.    She was evaluated by cardiology and started on amiodarone 200 mg bid for her chronic atrial fibrillation. Plans to decrease to 200 mg daily as an outpatient. Denies chest pain or palpitations. Reports feeling tired and run down since starting the medication.    She did have episodes of hypotension during hospitalization with the diuresis. Antihypertensives were discontinued during hospital stay but resumed amlodipine 10 and benazepril 40 at discharge.    Past Medical History:  Diagnosis Date  . A-fib ([place])   . CHF (congestive heart failure) ([place])   . COPD (chronic obstructive pulmonary disease) ([place])   . Hyperlipidemia   . Hypertension   . PE (pulmonary embolism)    Past Surgical History:  Procedure Laterality Date  . ABDOMINAL HYSTERECTOMY    . APPENDECTOMY    . BREAST LUMPECTOMY Left   . CHOLECYSTECTOMY    . TONSILLECTOMY     Family History  Problem Relation Age of Onset  . Breast cancer Mother   . Hypertension Mother   . Hyperlipidemia Mother   . Hypertension Maternal Grandfather   . Hyperlipidemia Maternal Grandfather    Social History   Social History  . Marital status: Single    Spouse name: N/A  . Number of children: N/A  . Years of education: N/A   Social History Main Topics  . Smoking status: Current Some Day Smoker    Packs/day: 1.50    Years: 54.00  . Smokeless tobacco: Never Used     Comment: smoking 4-5 cigarettes/ day  . Alcohol use No  . Drug use: No  . Sexual activity: Not Asked   Other Topics Concern  . None   Social History Narrative  . None    Review of Systems:   Review of Systems  Constitutional: Negative for chills  and fever.  HENT: Negative.   Eyes: Negative.   Respiratory: Negative for cough and shortness of breath.   Cardiovascular: Positive for leg swelling. Negative for chest pain, palpitations, orthopnea and PND.  Gastrointestinal: Negative for nausea and vomiting.  Genitourinary: Negative for dysuria and urgency.  Musculoskeletal: Negative.   Skin: Negative for rash.  Neurological: Negative.   Endo/Heme/Allergies: Negative.   Psychiatric/Behavioral: Negative.      Physical Exam:  Vitals:   07/17/16 1326  BP: 120/85  Pulse: 87  Temp: 97.8 F (36.6 C)  TempSrc: Oral   SpO2: 99%  Weight: (!) 321 lb (145.6 kg)  Height: 6\' 2"  (1.88 m)   Physical Exam  Constitutional:  Morbidly obese female sitting comfortably in no acute distress  HENT:  Head: Normocephalic and atraumatic.  Mouth/Throat: Oropharynx is clear and moist.  Eyes: Pupils are equal, round, and reactive to light.  Neck: No thyromegaly present.  Cardiovascular: Normal rate, S1 normal and S2 normal.  An irregularly irregular rhythm present. Exam reveals no gallop and no friction rub.   No murmur heard. Pulmonary/Chest: Effort normal and breath sounds normal. She has no wheezes. She has no rales.  Abdominal: Soft. Bowel sounds are normal. She exhibits no distension. There is no tenderness. There is no rebound and no guarding.  Musculoskeletal:  3+ pitting edema on left lower extremity, trace on right  Lymphadenopathy:    She has no cervical adenopathy.  Skin: Skin is warm and dry.  Psychiatric: Her mood appears anxious. She does not exhibit a depressed mood.  Vitals reviewed.    Assessment & Plan:   See Encounters Tab for problem based charting.  Patient discussed with Dr. Lynnae January

## 2016-07-18 ENCOUNTER — Telehealth: Payer: Self-pay | Admitting: Cardiovascular Disease

## 2016-07-18 NOTE — Telephone Encounter (Signed)
Cindy from [facility] called and wanted to know if Dr Gwenlyn Found will sign pt;s Home Health Orders please. Pt does not have a Primary Care doctor at this time.

## 2016-07-18 NOTE — Telephone Encounter (Signed)
Returned the phone call to [place] at [place] at [facility]. She wanted to know if Dr. Gwenlyn Found will sign the [place] for the patient if she faxed them over.

## 2016-07-19 ENCOUNTER — Telehealth: Payer: Self-pay | Admitting: Internal Medicine

## 2016-07-19 DIAGNOSIS — R739 Hyperglycemia, unspecified: Secondary | ICD-10-CM | POA: Insufficient documentation

## 2016-07-19 DIAGNOSIS — R109 Unspecified abdominal pain: Secondary | ICD-10-CM | POA: Insufficient documentation

## 2016-07-19 NOTE — Progress Notes (Signed)
Internal Medicine Clinic Attending  Case discussed with Dr. Boswell at the time of the visit.  We reviewed the resident's history and exam and pertinent patient test results.  I agree with the assessment, diagnosis, and plan of care documented in the resident's note.  

## 2016-07-19 NOTE — Telephone Encounter (Signed)
Olin Hauser from [facility] requesting VO.  Pls call back

## 2016-07-19 NOTE — Assessment & Plan Note (Addendum)
Weight today is 321 lbs with BMI 41.2.   Encouraged dietary changes and weight loss. Will need further discussion at future visits. Will discuss possible referral to nutritionist at future visits.

## 2016-07-19 NOTE — Telephone Encounter (Signed)
Returned the call to [place] at [place] at [facility] to inform that Dr. Gwenlyn Found would sign the Home Health orders if needed. She stated that she would initiate the process.

## 2016-07-19 NOTE — Telephone Encounter (Signed)
I would be happy to I would be happy to

## 2016-07-19 NOTE — Assessment & Plan Note (Addendum)
She remains in a.fib on exam today. Rate controlled. Currently on amiodarone 200 mg bid. Does not feel any palpitations.   CHAD-VASc 2. On Xarelto.   Plan: Continue amiodarone Has follow up with cardiology

## 2016-07-19 NOTE — Assessment & Plan Note (Signed)
>>  ASSESSMENT AND PLAN FOR DIASTOLIC HEART FAILURE (HCC) WRITTEN ON 07/19/2016  8:28 AM BY Valentino Nose, MD  ECHO done 07/12/16 with EF 60-65%. Mild LV concentric hypertrophy with normal systolic function. No wall motion abnormalities. Trivial mitral regurgitation. PA peak pressure 29 mm Hg.   Currently on Lasix 20 mg daily. Signicant LE edema, L>R.   PLAN: Increase lasix to 40 mg daily.  BMET today > patient refused labs draw today and left. Will get at next visit.  Has follow up with cardiology scheduled.

## 2016-07-19 NOTE — Assessment & Plan Note (Addendum)
ECHO done 07/12/16 with EF 60-65%. Mild LV concentric hypertrophy with normal systolic function. No wall motion abnormalities. Trivial mitral regurgitation. PA peak pressure 29 mm Hg.   Currently on Lasix 20 mg daily. Signicant LE edema, L>R.   PLAN: Increase lasix to 40 mg daily.  BMET today > patient refused labs draw today and left. Will get at next visit.  Has follow up with cardiology scheduled.

## 2016-07-19 NOTE — Assessment & Plan Note (Signed)
09/22/2014 CT pulmonary angiogram showing multiple bilateral PEs in the distal left and right main pulmonary arteries with extension into the right upper, middle lobe and bilateral lower lobe lobar, segmental and subsegmental pulmonary arteries.  Lower extremity venous duplex showed an acute occlusive DVT to the common femoral, femoral and popliteal veins.  Appears to have thought to be provoked due to her obesity, recent long travel and possibly her diet pills. Currently on Xarelto though appears to be 2/2 her PAF with elevated CHAD-VASc more than her history of PE.   Plan: Continue Xarelto

## 2016-07-19 NOTE — Assessment & Plan Note (Signed)
BP Readings from Last 3 Encounters:  07/17/16 120/85  07/13/16 (!) 144/97  12/10/15 (!) 164/93    Lab Results  Component Value Date   NA 139 07/13/2016   K 3.7 07/13/2016   CREATININE 0.99 07/13/2016   BP today 120/85 today. Currently on amlodipine 10 mg daily and benazepril 40 mg daily. Denies any side effects tdoay.   Assessment: Stable HTN  Plan:  Continue current medications

## 2016-07-19 NOTE — Assessment & Plan Note (Signed)
Patient with left leg in [place] today. Does not want to have this removed today, reports she has follow up with wound care next week. Significant 3+ pitting edema in left leg today.   Plan: Follow up with wound care

## 2016-07-19 NOTE — Assessment & Plan Note (Signed)
Patient with a history of elevated blood sugars on recent BMETs. Unclear if fasting. Given her obesity and risk for DM, will check A1c today.  Unfortunately, patient refused all lab draw today. Will check at follow up.

## 2016-07-19 NOTE — Assessment & Plan Note (Signed)
No PFTs on file. Current smoker; reports 1.5 PPD for 54 years. Currently smoking 4-5 cigarettes a day. Only has albuterol nebulizer at home. Reports using it very infrequently.   Plan: Continue albuterol prn Will need PFTs in the future Encouraged smoking cessation

## 2016-07-19 NOTE — Assessment & Plan Note (Signed)
At the end of the visit she brought up that she was having diarrhea. Appears she was having diarrhea during her most recent hospitalization and was one of the reasons she went in for evaluation. Hospitalist's notes indicate they believed it was viral gastroenteritis and was almost resolved at time of discharge. Reports continued diarrhea today. Appears she did receive antibiotics but sounds as if the diarrhea proceeded the abx use.  Could not discuss fully as she only mentioned it as we were wrapping up today's visit.   TSH was mildly suppressed when checked in the hospital at 0.216.   Would like to check TSH, free T4 and T3 today but patient refused lab draw. Will reassess at follow up visit in 2 weeks.

## 2016-07-19 NOTE — Assessment & Plan Note (Addendum)
Current everyday smoker. Reports smoking 1.5 PPD for 54 years. Currently smoking 4-5 cigarettes a day. No interest in quitting today.   Will readdress at future visits and continue to encourage cessation.

## 2016-07-25 ENCOUNTER — Telehealth: Payer: Self-pay | Admitting: Internal Medicine

## 2016-07-25 ENCOUNTER — Telehealth: Payer: Self-pay | Admitting: Cardiovascular Disease

## 2016-07-25 NOTE — Telephone Encounter (Signed)
Home health nurse called --   States patient was getting Unna boot wraps ordered by previous doctor. Wanted to see if nursing could do this wrap for relief of unilateral swelling in left lower leg.  Caller aware I'll route for Dr. Kennon Holter advice/OK, any precautions/special instructions. she voiced thanks.

## 2016-07-25 NOTE — Telephone Encounter (Signed)
Elmyra Ricks with [facility] is calling on behalf of patient, she states that Sharon Vasquez has some swelling in her lower left leg. Elmyra Ricks would like to know if Dr. Gwenlyn Found could write an order for an Unna boot. Please call to f/u. Thanks.

## 2016-07-25 NOTE — Telephone Encounter (Signed)
[facility] Nurse would ike a call back in reference to this patient.

## 2016-07-26 ENCOUNTER — Encounter: Payer: Self-pay | Admitting: Physician Assistant

## 2016-07-26 ENCOUNTER — Ambulatory Visit (INDEPENDENT_AMBULATORY_CARE_PROVIDER_SITE_OTHER): Payer: Medicare Other | Admitting: Physician Assistant

## 2016-07-26 VITALS — BP 128/81 | HR 87 | Ht 74.0 in | Wt 317.8 lb

## 2016-07-26 DIAGNOSIS — E785 Hyperlipidemia, unspecified: Secondary | ICD-10-CM

## 2016-07-26 DIAGNOSIS — I10 Essential (primary) hypertension: Secondary | ICD-10-CM | POA: Diagnosis not present

## 2016-07-26 DIAGNOSIS — Z79899 Other long term (current) drug therapy: Secondary | ICD-10-CM | POA: Diagnosis not present

## 2016-07-26 DIAGNOSIS — I5032 Chronic diastolic (congestive) heart failure: Secondary | ICD-10-CM

## 2016-07-26 DIAGNOSIS — I481 Persistent atrial fibrillation: Secondary | ICD-10-CM

## 2016-07-26 DIAGNOSIS — I4819 Other persistent atrial fibrillation: Secondary | ICD-10-CM

## 2016-07-26 LAB — BASIC METABOLIC PANEL
BUN: 13 mg/dL (ref 7–25)
CO2: 23 mmol/L (ref 20–31)
Calcium: 9 mg/dL (ref 8.6–10.4)
Chloride: 106 mmol/L (ref 98–110)
Creat: 1.21 mg/dL — ABNORMAL HIGH (ref 0.50–0.99)
Glucose, Bld: 103 mg/dL — ABNORMAL HIGH (ref 65–99)
Potassium: 3.7 mmol/L (ref 3.5–5.3)
Sodium: 142 mmol/L (ref 135–146)

## 2016-07-26 MED ORDER — TORSEMIDE 20 MG PO TABS: 40.0000 mg | ORAL_TABLET | Freq: Every day | ORAL | 5 refills | Status: DC

## 2016-07-26 MED ORDER — AMIODARONE HCL 200 MG PO TABS: 200.0000 mg | ORAL_TABLET | Freq: Every day | ORAL | 5 refills | Status: DC

## 2016-07-26 NOTE — Patient Instructions (Addendum)
Medication Instructions:   DECREASE amiodarone dose to 200mg  daily STOP lasix START torsemide 40mg  daily (2x 20mg  tablets)  Labwork:  BMET today at [facility] (downstairs from this office in [place]) BMET in 1 week at [facility] office ([address])   Testing/Procedures: none   Follow-Up: in 4 weeks with APP at [facility]     If you need a refill on your cardiac medications before your next appointment, please call your pharmacy.

## 2016-07-26 NOTE — Telephone Encounter (Signed)
Talked to Elmyra Ricks - stated had wrong office, Dr Gwenlyn Found has been signing off pt's orders.

## 2016-07-26 NOTE — Progress Notes (Signed)
Cardiology Office Note    Date:  07/27/2016   ID:  Sharon Vasquez, DOB 16-Oct-1950, [name] [id number]  PCP:  Pcp Not In System  Cardiologist:  Dr. Gwenlyn Found  Chief Complaint  Patient presents with  . Hospitalization Follow-up    seen for Dr. Gwenlyn Found    History of Present Illness:  Sharon Vasquez is a 66 y.o. female with PMH of HTN, HLD, morbid obesity, h/o PE, PAF on Xarelto, COPD, and diastolic heart failure. She presented to the hospital recently with dyspnea and found to be in atrial fibrillation and acute diastolic heart failure. She was also treated for COPD exacerbation in the lower extremity cellulitis. She has prior history of admission in [place] for diastolic heart failure. She has a history of unprovocked pulmonary embolism and left lower extremity DVT in [place] in June 2016. She had a history of paroxysmal atrial fibrillation during the previous admission in [place]. She was placed on amiodarone and his Xarelto at that time, amiodarone was later discontinued. She was seen by cardiology service during the recent hospitalization, who suspected this is a combination of acute on chronic diastolic heart failure and also COPD exacerbation. She was compliant with Xarelto at that time.  She has been doing well since she left the hospital. She is still using atrial fibrillation, however rate controlled. I would decrease her amiodarone to 200 mg daily. Otherwise she is euvolemic on physical exam. However she says she is not only on the 40 mg daily, although the 40 mg daily every morning, she was actually taking an additional 60 mg every afternoon in order to sleep better. She says if she does not take to 60 mg in the afternoon, she accumulated fluid quite fast and become more short of breath. She says she has not been responding to the Lasix very well, she wished to resume her previous torsemide. It appears she was taking 40 mg daily of torsemide last year. I will discontinue her Lasix and  switch her back to 40 mg torsemide. She will have a basic metabolic panel today and in one week. I will bring her back in 4 weeks for reassessment of the volume status.  Note, she likely will require sleep study sometimes in the future.   Past Medical History:  Diagnosis Date  . A-fib ([place])   . CHF (congestive heart failure) ([place])   . COPD (chronic obstructive pulmonary disease) ([place])   . Hyperlipidemia   . Hypertension   . Morbid obesity ([place])   . PE (pulmonary embolism)     Past Surgical History:  Procedure Laterality Date  . ABDOMINAL HYSTERECTOMY    . APPENDECTOMY    . BREAST LUMPECTOMY Left   . CHOLECYSTECTOMY    . TONSILLECTOMY      Current Medications: Outpatient Medications Prior to Visit  Medication Sig Dispense Refill  . albuterol (PROVENTIL) (2.5 MG/3ML) 0.083% nebulizer solution Take 3 mLs (2.5 mg total) by nebulization every 4 (four) hours as needed for wheezing or shortness of breath. 75 mL 0  . aspirin EC 81 MG tablet Take 81 mg by mouth daily.    . celecoxib (CELEBREX) 200 MG capsule Take 200 mg by mouth 2 (two) times daily as needed (inflammation).    [name] esomeprazole (NEXIUM) 40 MG capsule Take 40 mg by mouth daily as needed (for heartburn or indigestion).    . gabapentin (NEURONTIN) 400 MG capsule Take 300-400 mg by mouth daily.     [name]  potassium chloride (K-DUR,KLOR-CON) 10 MEQ tablet Take 10 mEq by mouth 2 (two) times daily.    . rivaroxaban (XARELTO) 20 MG TABS tablet Take 20 mg by mouth every morning.     [name] amiodarone (PACERONE) 200 MG tablet Take 1 tablet (200 mg total) by mouth 2 (two) times daily. 60 tablet 0  . furosemide (LASIX) 40 MG tablet Take 1 tablet (40 mg total) by mouth daily. 30 tablet 1  . amLODipine-benazepril (LOTREL) 10-40 MG capsule Take 1 capsule by mouth daily.      No facility-administered medications prior to visit.      Allergies:   Patient has no known allergies.   Social History   Social History  . Marital status: Single     Spouse name: N/A  . Number of children: N/A  . Years of education: N/A   Social History Main Topics  . Smoking status: Current Some Day Smoker    Packs/day: 1.50    Years: 54.00    Types: Cigarettes    Start date: 08/16/1961  . Smokeless tobacco: Never Used     Comment: smoking 4-5 cigarettes/ day  . Alcohol use No  . Drug use: No  . Sexual activity: Not Asked   Other Topics Concern  . None   Social History Narrative  . None     Family History:  The patient's family history includes Breast cancer in her mother; Hyperlipidemia in her maternal grandfather and mother; Hypertension in her maternal grandfather and mother.   ROS:   Please see the history of present illness.    ROS All other systems reviewed and are negative.   PHYSICAL EXAM:   VS:  BP 128/81   Pulse 87   Ht 6\' 2"  (1.88 m)   Wt (!) 317 lb 12.8 oz (144.2 kg)   SpO2 97% Comment: on RA  BMI 40.80 kg/m    GEN: Well nourished, well developed, in no acute distress  HEENT: normal  Neck: no JVD, carotid bruits, or masses Cardiac: irregularly irregular; no murmurs, rubs, or gallops,no edema  Respiratory:  clear to auscultation bilaterally, normal work of breathing GI: soft, nontender, nondistended, + BS MS: no deformity or atrophy  Skin: warm and dry, no rash Neuro:  Alert and Oriented x 3, Strength and sensation are intact Psych: euthymic mood, full affect  Wt Readings from Last 3 Encounters:  07/26/16 (!) 317 lb 12.8 oz (144.2 kg)  07/17/16 (!) 321 lb (145.6 kg)  07/13/16 (!) 311 lb 1.6 oz (141.1 kg)      Studies/Labs Reviewed:   EKG:  EKG is ordered today.  The ekg ordered today demonstrates Atrial fibrillation, mild T-wave inversion from V3 through V6.  Recent Labs: 07/11/2016: ALT 14; B Natriuretic Peptide 527.1 07/13/2016: Hemoglobin 12.1; Magnesium 1.7; Platelets 415; TSH 0.216 07/26/2016: BUN 13; Creat 1.21; Potassium 3.7; Sodium 142   Lipid Panel No results found for: CHOL, TRIG, HDL, CHOLHDL,  VLDL, LDLCALC, LDLDIRECT  Additional studies/ records that were reviewed today include:   Echo 07/12/2016 LV EF: 60% -   65%  ------------------------------------------------------------------- Indications:      Dyspnea 786.09.  ------------------------------------------------------------------- History:   PMH:   Atrial fibrillation.  Congestive heart failure. Risk factors:  Hypertension.  ------------------------------------------------------------------- Study Conclusions  - Left ventricle: The cavity size was normal. There was mild   concentric hypertrophy. Systolic function was normal. The   estimated ejection fraction was in the range of 60% to 65%. Wall   motion was normal; there  were no regional wall motion   abnormalities. - Aortic valve: Transvalvular velocity was within the normal range.   There was no stenosis. There was no regurgitation. - Mitral valve: Transvalvular velocity was within the normal range.   There was no evidence for stenosis. There was trivial   regurgitation. - Left atrium: The atrium was moderately dilated. - Right ventricle: The cavity size was normal. Wall thickness was   normal. Systolic function was normal. - Atrial septum: No defect or patent foramen ovale was identified   by color flow Doppler. - Tricuspid valve: There was mild regurgitation. - Pulmonary arteries: Systolic pressure was within the normal   range. PA peak pressure: 29 mm Hg (S).   ASSESSMENT:    1. Persistent atrial fibrillation ([place])   2. Encounter for long-term (current) use of medications   3. Essential hypertension   4. Hyperlipidemia, unspecified hyperlipidemia type   5. Morbid obesity ([place])   6. Chronic diastolic heart failure (HCC)      PLAN:  In order of problems listed above:  1. Persistent atrial fibrillation: Continue to be atrial fibrillation, however rate controlled. Will decrease amiodarone to 200 mg daily.  - CHA10DS2-Vasc 4 (female, age, HTN, HF).  Consider order PFT on followup  2. Chronic diastolic heart failure: She is euvolemic on physical exam, however she is not taking the 40 mg daily of Lasix as suggested, she is actually taking 40 mg a.m. and 60 mg p.m. of Lasix instead. She wish to switch back to the torsemide she was taking before. She was on 40 mg daily of torsemide last year. I will switch her back to the previous dose. We'll obtain a basic metabolic panel today and also in one week.   3. Hypertension: Blood pressure stable  4. Hyperlipidemia: I do not see a previous lipid panel although she carries a diagnosis of hyperlipidemia. She is not on a statin. Consider obtain fasting lipid panel  5. Morbid obesity: She does have very thickened skin in the bilateral lower extremity, making diagnosis of heart failure very difficult. However appears to be euvolemic at this time. Ultimately losing weight likely will help her heart failure symptom as well. Consider sleep study on follow-up.    Medication Adjustments/Labs and Tests Ordered: Current medicines are reviewed at length with the patient today.  Concerns regarding medicines are outlined above.  Medication changes, Labs and Tests ordered today are listed in the Patient Instructions below. Patient Instructions  Medication Instructions:   DECREASE amiodarone dose to 200mg  daily STOP lasix START torsemide 40mg  daily (2x 20mg  tablets)  Labwork:  BMET today at [facility] (downstairs from this office in [place]) BMET in 1 week at [facility] [facility] office ([address])   Testing/Procedures: none   Follow-Up: in 4 weeks with APP at [facility]     If you need a refill on your cardiac medications before your next appointment, please call your pharmacy.      Hilbert Corrigan, [place]  07/27/2016 3:06 PM    [facility] [address], [place], [place]  [address] Phone: [phone]; Fax: [phone]

## 2016-07-27 ENCOUNTER — Encounter: Payer: Self-pay | Admitting: Physician Assistant

## 2016-07-28 NOTE — Telephone Encounter (Signed)
Returned the phone call to the patient. Gave her the results per Almyra Deforest, PA:  Kidney function slightly worsened compare to 2 weeks ago which is expected since Mrs. Veach was taking more lasix than prescribed by discharge physician. We have since changed her to torsemide, therefore plan to recheck BMET in 1 week to see the trend in kidney function. Potassium stable  She stated that she did not want to come back for lab work yet because she has not started the Torsemide due to finances. She stated that she will not start the Torsemide until 5/3. She would like to wait. She is aware of her results and verbalized her understanding. Will route to the PA for further advice.

## 2016-07-28 NOTE — Telephone Encounter (Signed)
Pt wants to know why she needs lab work on 08-02-16 and she just had lab work on 07-26-16?

## 2016-07-30 NOTE — Telephone Encounter (Signed)
Ok. Once she starts on the torsemide, she will need to check BMET in 1 week after starting to make sure her kidney is not getting worse. Also make sure she understand, she should only take potassium supplement when she takes her diuretic because as we urinate, we lose potassium, so she should not be taking the potassium chloride supplement if she is not taking the diuretic.

## 2016-07-31 ENCOUNTER — Ambulatory Visit (INDEPENDENT_AMBULATORY_CARE_PROVIDER_SITE_OTHER): Payer: Medicare Other | Admitting: Internal Medicine

## 2016-07-31 VITALS — BP 110/73 | HR 60 | Temp 99.7°F | Ht 74.0 in | Wt 308.9 lb

## 2016-07-31 DIAGNOSIS — R739 Hyperglycemia, unspecified: Secondary | ICD-10-CM

## 2016-07-31 DIAGNOSIS — I872 Venous insufficiency (chronic) (peripheral): Secondary | ICD-10-CM

## 2016-07-31 DIAGNOSIS — I110 Hypertensive heart disease with heart failure: Secondary | ICD-10-CM | POA: Diagnosis not present

## 2016-07-31 DIAGNOSIS — Z79899 Other long term (current) drug therapy: Secondary | ICD-10-CM

## 2016-07-31 DIAGNOSIS — K5900 Constipation, unspecified: Secondary | ICD-10-CM | POA: Diagnosis not present

## 2016-07-31 DIAGNOSIS — Z5189 Encounter for other specified aftercare: Secondary | ICD-10-CM | POA: Diagnosis not present

## 2016-07-31 DIAGNOSIS — I5032 Chronic diastolic (congestive) heart failure: Secondary | ICD-10-CM

## 2016-07-31 DIAGNOSIS — E669 Obesity, unspecified: Secondary | ICD-10-CM

## 2016-07-31 DIAGNOSIS — R1030 Lower abdominal pain, unspecified: Secondary | ICD-10-CM

## 2016-07-31 DIAGNOSIS — R109 Unspecified abdominal pain: Secondary | ICD-10-CM | POA: Diagnosis not present

## 2016-07-31 DIAGNOSIS — F17210 Nicotine dependence, cigarettes, uncomplicated: Secondary | ICD-10-CM

## 2016-07-31 DIAGNOSIS — J449 Chronic obstructive pulmonary disease, unspecified: Secondary | ICD-10-CM

## 2016-07-31 DIAGNOSIS — I10 Essential (primary) hypertension: Secondary | ICD-10-CM

## 2016-07-31 DIAGNOSIS — I5030 Unspecified diastolic (congestive) heart failure: Secondary | ICD-10-CM

## 2016-07-31 LAB — GLUCOSE, CAPILLARY: Glucose-Capillary: 113 mg/dL — ABNORMAL HIGH (ref 65–99)

## 2016-07-31 LAB — POCT GLYCOSYLATED HEMOGLOBIN (HGB A1C): Hemoglobin A1C: 5.6

## 2016-07-31 MED ORDER — POTASSIUM CHLORIDE CRYS ER 10 MEQ PO TBCR: 10.0000 meq | EXTENDED_RELEASE_TABLET | Freq: Two times a day (BID) | ORAL | 0 refills | Status: DC

## 2016-07-31 NOTE — Telephone Encounter (Signed)
Returned the phone call to the patient to advise her of [employer], [place] instructions:  Ok. Once she starts on the torsemide, she will need to check BMET in 1 week after starting to make sure her kidney is not getting worse. Also make sure she understand, she should only take potassium supplement when she takes her diuretic because as we urinate, we lose potassium, so she should not be taking the potassium chloride supplement if she is not taking the diuretic.   She verbalized her understanding and stated that she would start the Torsemide on Thursday and then get her labs drawn next week.

## 2016-07-31 NOTE — Patient Instructions (Signed)
General Instructions: - Wear your compression stockings as much as possible - Continue Lasix 40 mg daily and then pick up Torsemide on Thursday. Stop lasix once you start Torsemide. - Will check blood work today - Take Miralax if you do not have a bowel movement in the next 1 day  Please bring your medicines with you each time you come to clinic.  Medicines may include prescription medications, over-the-counter medications, herbal remedies, eye drops, vitamins, or other pills.   Progress Toward Treatment Goals:  No flowsheet data found.  Self Care Goals & Plans:  No flowsheet data found.  No flowsheet data found.   Care Management & Community Referrals:  No flowsheet data found.

## 2016-07-31 NOTE — Progress Notes (Signed)
   CC: Follow up for diarrhea  HPI:  [name] is a 66 y.o. woman with PMHx as noted below who presents today for follow up of her diarrhea.  Diarrhea/Abdominal pain: Patient had a recent hospitalization for a COPD exacerbation in early April and had noted diarrhea that started prior to that hospitalization. She reports her diarrhea has actually improved since she was evaluated in clinic about 1 week ago. She reports having lower abdominal pain that started last night. She describes the pain as crampy, dull, 6/10 in severity, and intermittent. She reports associated decreased appetite. She notes she has not had a bowel movement in 3 days. She is passing gas. She denies any urinary symptoms.   HFpEF: She was noted to have significant lower extremity edema last visit. She reports she has not taken her lasix in the past 2 days as she does not want to deal with the frequent urination. She states she was also switched to Torsemide by her Cardiologist but has not picked this up. She plans to pick this up on Thursday (5/3) when she can afford it. She reports her legs are still swollen.   Elevated blood glucose: Noted to have an elevated blood sugar on prior labs. She refused lab draws last visit, but is agreeable today.  HTN: BP initially elevated at 154/88. Repeat BP 110/63. She is taking Amlodipine 10 mg daily and Benazepril 40 mg daily.  Past Medical History:  Diagnosis Date  . A-fib ([place])   . CHF (congestive heart failure) ([place])   . COPD (chronic obstructive pulmonary disease) ([place])   . Hyperlipidemia   . Hypertension   . Morbid obesity ([place])   . PE (pulmonary embolism)     Review of Systems:   All negative except per HPI  Physical Exam:  Vitals:   07/31/16 1323  BP: (!) 154/88  Pulse: 73  Temp: 99.7 F (37.6 C)  TempSrc: Oral  SpO2: 96%  Weight: (!) 308 lb 14.4 oz (140.1 kg)  Height: 6\' 2"  (1.88 m)   Repeat BP 110/63  General: Obese woman in NAD CV: RRR, no  m/g/r Pulm: CTA bilaterally, breaths non-labored Abd: BS+, soft, obese, mild diffuse tenderness, no guarding or rebound Ext: 1+ peripheral edema  Assessment & Plan:   See Encounters Tab for problem based charting.  Patient discussed with Dr. Lynnae January

## 2016-08-01 ENCOUNTER — Encounter: Payer: Self-pay | Admitting: Internal Medicine

## 2016-08-01 LAB — TSH: TSH: 1.88 u[IU]/mL (ref 0.450–4.500)

## 2016-08-01 NOTE — Assessment & Plan Note (Signed)
>>  ASSESSMENT AND PLAN FOR DIASTOLIC HEART FAILURE (HCC) WRITTEN ON 08/01/2016  9:22 PM BY RIVET, CARLY J, MD  Does not appear volume overloaded other than her legs but venous insufficiency is likely contributing. She has chronic venous stasis skin changes on her legs. Advised her to continue lasix 40 mg daily until she picks up her Torsemide. Also recommended to wear her compression stockings as much as possible. She will have a repeat bmet at her cardiologist's office so will not check today.

## 2016-08-01 NOTE — Assessment & Plan Note (Addendum)
She is no longer having diarrhea but instead is having constipation. Her abdominal pain and decreased appetite are mostly likely related to the constipation. Her abdomen was benign on exam. She is still passing gas and non-distended on exam so doubt SBO. Will check a TSH to ensure thyroid dysfunction is not contributing to her symptoms. Recommended that she take Miralax if she does not have a BM in the next 1-2 days.    Update: TSH returned normal

## 2016-08-01 NOTE — Assessment & Plan Note (Signed)
Will check an A1c today. >>> A1c within normal range at 5.6.

## 2016-08-01 NOTE — Assessment & Plan Note (Signed)
Does not appear volume overloaded other than her legs but venous insufficiency is likely contributing. She has chronic venous stasis skin changes on her legs. Advised her to continue lasix 40 mg daily until she picks up her Torsemide. Also recommended to wear her compression stockings as much as possible. She will have a repeat bmet at her cardiologist's office so will not check today.

## 2016-08-01 NOTE — Assessment & Plan Note (Signed)
BP well-controlled -Continue current regimen 

## 2016-08-02 ENCOUNTER — Other Ambulatory Visit: Payer: Medicare Other

## 2016-08-02 DIAGNOSIS — I872 Venous insufficiency (chronic) (peripheral): Secondary | ICD-10-CM | POA: Diagnosis not present

## 2016-08-02 DIAGNOSIS — Z86711 Personal history of pulmonary embolism: Secondary | ICD-10-CM | POA: Diagnosis not present

## 2016-08-02 DIAGNOSIS — I110 Hypertensive heart disease with heart failure: Secondary | ICD-10-CM | POA: Diagnosis not present

## 2016-08-02 DIAGNOSIS — I481 Persistent atrial fibrillation: Secondary | ICD-10-CM | POA: Diagnosis not present

## 2016-08-02 NOTE — Progress Notes (Signed)
Internal Medicine Clinic Attending  Case discussed with Dr. Rivet soon after the resident saw the patient.  We reviewed the resident's history and exam and pertinent patient test results.  I agree with the assessment, diagnosis, and plan of care documented in the resident's note.  

## 2016-08-03 ENCOUNTER — Telehealth: Payer: Self-pay | Admitting: Cardiovascular Disease

## 2016-08-03 NOTE — Telephone Encounter (Signed)
Received fax from [facility] stating potential drug interaction between Xarelto and Naproxen. Pt currently taking 500 mg Naproxen daily and 20 mg Xarelto daily.   Spoke to Tommy Medal, PharmD who stated: "Problem with rivaroxaban and naproxen is more that NSAIDS (naproxen) cause increased risk of GI bleeds, so not good to mix with meds that increase bleeding risk.  Also NSAIDS have increased risk of serious cardiac thrombotic events (including MI/stroke), so we encourage patients to take only sparingly."  She recommended that the patient only take Naproxen as needed and no more than 2-3 times per week. I wrote this on the order and will route to Dr. Gwenlyn Found as Juluis Rainier and have sign fax to be sent back to [facility] [phone].

## 2016-08-04 DIAGNOSIS — I110 Hypertensive heart disease with heart failure: Secondary | ICD-10-CM | POA: Diagnosis not present

## 2016-08-04 DIAGNOSIS — I481 Persistent atrial fibrillation: Secondary | ICD-10-CM | POA: Diagnosis not present

## 2016-08-04 DIAGNOSIS — Z86711 Personal history of pulmonary embolism: Secondary | ICD-10-CM | POA: Diagnosis not present

## 2016-08-04 DIAGNOSIS — I872 Venous insufficiency (chronic) (peripheral): Secondary | ICD-10-CM | POA: Diagnosis not present

## 2016-08-09 DIAGNOSIS — I872 Venous insufficiency (chronic) (peripheral): Secondary | ICD-10-CM | POA: Diagnosis not present

## 2016-08-09 DIAGNOSIS — I481 Persistent atrial fibrillation: Secondary | ICD-10-CM | POA: Diagnosis not present

## 2016-08-09 DIAGNOSIS — Z86711 Personal history of pulmonary embolism: Secondary | ICD-10-CM | POA: Diagnosis not present

## 2016-08-09 DIAGNOSIS — I110 Hypertensive heart disease with heart failure: Secondary | ICD-10-CM | POA: Diagnosis not present

## 2016-08-11 DIAGNOSIS — I481 Persistent atrial fibrillation: Secondary | ICD-10-CM | POA: Diagnosis not present

## 2016-08-11 DIAGNOSIS — I872 Venous insufficiency (chronic) (peripheral): Secondary | ICD-10-CM | POA: Diagnosis not present

## 2016-08-11 DIAGNOSIS — I110 Hypertensive heart disease with heart failure: Secondary | ICD-10-CM | POA: Diagnosis not present

## 2016-08-11 DIAGNOSIS — Z86711 Personal history of pulmonary embolism: Secondary | ICD-10-CM | POA: Diagnosis not present

## 2016-08-14 ENCOUNTER — Other Ambulatory Visit: Payer: Self-pay | Admitting: Internal Medicine

## 2016-08-14 DIAGNOSIS — I110 Hypertensive heart disease with heart failure: Secondary | ICD-10-CM | POA: Diagnosis not present

## 2016-08-14 DIAGNOSIS — I481 Persistent atrial fibrillation: Secondary | ICD-10-CM | POA: Diagnosis not present

## 2016-08-14 DIAGNOSIS — Z86711 Personal history of pulmonary embolism: Secondary | ICD-10-CM | POA: Diagnosis not present

## 2016-08-14 DIAGNOSIS — I872 Venous insufficiency (chronic) (peripheral): Secondary | ICD-10-CM | POA: Diagnosis not present

## 2016-08-14 MED ORDER — AMITRIPTYLINE HCL 25 MG PO TABS: 50.0000 mg | ORAL_TABLET | Freq: Every day | ORAL | 0 refills | Status: DC

## 2016-08-14 MED ORDER — ESOMEPRAZOLE MAGNESIUM 40 MG PO CPDR: 40.0000 mg | DELAYED_RELEASE_CAPSULE | Freq: Every day | ORAL | 0 refills | Status: DC | PRN

## 2016-08-14 MED ORDER — RIVAROXABAN 20 MG PO TABS: 20.0000 mg | ORAL_TABLET | ORAL | 0 refills | Status: DC

## 2016-08-14 MED ORDER — ALBUTEROL SULFATE (2.5 MG/3ML) 0.083% IN NEBU: 2.5000 mg | INHALATION_SOLUTION | RESPIRATORY_TRACT | 0 refills | Status: DC | PRN

## 2016-08-14 MED ORDER — ALBUTEROL SULFATE HFA 108 (90 BASE) MCG/ACT IN AERS: 2.0000 | INHALATION_SPRAY | Freq: Four times a day (QID) | RESPIRATORY_TRACT | 1 refills | Status: DC | PRN

## 2016-08-14 MED ORDER — GABAPENTIN 400 MG PO CAPS
400.0000 mg | ORAL_CAPSULE | Freq: Every day | ORAL | 0 refills | Status: DC
Start: 2016-08-14 — End: 2017-07-19

## 2016-08-14 NOTE — Telephone Encounter (Signed)
Refill Request    TakingNot TakingUnknown  albuterol (PROAIR HFA) 108 (90 Base) MCG/ACT inhaler  gabapentin (NEURONTIN) 400 MG capsule  esomeprazole (NEXIUM) 40 MG capsule  naproxen (NAPROSYN) 500 MG tablet  rivaroxaban (XARELTO) 20 MG TABS tablet  amitriptyline (ELAVIL) 25 MG tablet  albuterol (PROVENTIL) (2.5 MG/3ML) 0.083% nebulizer solution

## 2016-08-14 NOTE — Telephone Encounter (Addendum)
I have been assigned as Ms Wettstein PCP, she has been seen twice in our clinic. Refilled albuterol- COPD, xarelto- afib I will refill Nexium for this time however there is no documentation in the chart of why she is taking this. I am also unclear as to what the indication is for her amitriptyline however will continue this until she is seen. I also do not see documentation for why she is taking gabapentin.  I will continue this until she is seen. I will have to refuse to refill the Naproxen, she has both Naproxen as well as Celebrex on her medication list, I also cannot clearly see why she is taking this and she takes a blood thinner making this higher risk.  Please schedule her to see me as soon as possible so that I can do a medication reconciliation.

## 2016-08-18 DIAGNOSIS — I872 Venous insufficiency (chronic) (peripheral): Secondary | ICD-10-CM | POA: Diagnosis not present

## 2016-08-18 DIAGNOSIS — Z86711 Personal history of pulmonary embolism: Secondary | ICD-10-CM | POA: Diagnosis not present

## 2016-08-18 DIAGNOSIS — I481 Persistent atrial fibrillation: Secondary | ICD-10-CM | POA: Diagnosis not present

## 2016-08-18 DIAGNOSIS — I110 Hypertensive heart disease with heart failure: Secondary | ICD-10-CM | POA: Diagnosis not present

## 2016-08-21 ENCOUNTER — Other Ambulatory Visit: Payer: Self-pay | Admitting: *Deleted

## 2016-08-21 DIAGNOSIS — G8929 Other chronic pain: Secondary | ICD-10-CM

## 2016-08-21 DIAGNOSIS — M545 Low back pain, unspecified: Secondary | ICD-10-CM

## 2016-08-21 DIAGNOSIS — K5903 Drug induced constipation: Secondary | ICD-10-CM | POA: Insufficient documentation

## 2016-08-21 HISTORY — DX: Other chronic pain: G89.29

## 2016-08-21 HISTORY — DX: Low back pain, unspecified: M54.50

## 2016-08-21 MED ORDER — DOCUSATE SODIUM 100 MG PO CAPS: 100.0000 mg | ORAL_CAPSULE | Freq: Two times a day (BID) | ORAL | 0 refills | Status: DC | PRN

## 2016-08-21 MED ORDER — NAPROXEN 500 MG PO TABS: 500.0000 mg | ORAL_TABLET | Freq: Every day | ORAL | 3 refills | Status: DC | PRN

## 2016-08-21 NOTE — Telephone Encounter (Signed)
I also called [name] and confirmed [name]'s history.  She states she no longer takes celebrex and states the naproxen works much better for her back pain.  She is aware that it increases her risk of bleeding with the xarelto and is aware that she should be taking the esomeprazole on a daily basis for some protection.  She was strongly encouraged to take the PPI when taking the naproxen.  She also stated that she takes the naproxen 500 mg daily as needed and always with food.  She was encouraged to continue to take it with food.  She denies any heart failure exacerbations while taking the naproxen.  In addition, she noted chronic constipation likely related to medications (amitriptyline) and asked for a medication for this.  I sent a prescription for naproxen 500 mg q24H PRN back pain (disp #90, 3 refills) and docusate 100 mg twice daily as need for constipation (disp #60 with no refills).  I also discontinued the Celebrex from her list as she is not taking this medication.

## 2016-08-21 NOTE — Telephone Encounter (Signed)
Pt had called to see why Naproxen was not refilled.  I read her the note per Dr Heber [place] from 5/14 for reason why it was refused and both Naproxen and Celebrex is on her med list. Pt stated she does not take Celebrex; she knows not to take these 2 meds at the same time. She's having back pain and prefers Naproxen. I told her I will re-send her request to her doctor. Thanks

## 2016-08-23 NOTE — Progress Notes (Signed)
Cardiology Office Note    Date:  08/24/2016   ID:  Sharon Vasquez, DOB 1950-09-13, [name] [id number]  PCP:  Lucious Groves, DO  Cardiologist: Dr. Gwenlyn Found  Chief Complaint  Patient presents with  . Follow-up    History of Present Illness:  Sharon Vasquez is a 66 y.o. female with history of hypertension, HLD, morbid obesity, PE 2016, PAF on Xarelto, COPD, and diastolic CHF. She last saw Almyra Deforest, [place] after recent hospitalization for rapid atrial fibrillation and diastolic CHF. CHADSVASC=4.She was also treated for COPD exacerbation and lower extremity cellulitis.   She was placed on amiodarone and her dose was decreased to 200 mg daily at that office visit. She appeared euvolemic but was taking extra 60 mg of Lasix in the afternoon. She wanted to switch to torsemide 40 mg daily which she did but when she saw internal medicine hadn't gotten it filled yet. Had also skipped her Lasix dose.  Patient comes in today feeling better. She is on the torsemide and says her swelling is down. She tries to follow low sodium diet as best she can but can't afford fresh fruits and vegetables and still eats a lot of processed foods. She says she has to switch from Dr. Gwenlyn Found to the [facility] office because she lives closer here and drives her scooter. She says her pulse is been between 70 and 80 at home and has had no more rapid rates.      Past Medical History:  Diagnosis Date  . A-fib ([place])   . CHF (congestive heart failure) ([place])   . COPD (chronic obstructive pulmonary disease) ([place])   . Hyperlipidemia   . Hypertension   . Morbid obesity ([place])   . PE (pulmonary embolism)     Past Surgical History:  Procedure Laterality Date  . ABDOMINAL HYSTERECTOMY    . APPENDECTOMY    . BREAST LUMPECTOMY Left   . CHOLECYSTECTOMY    . TONSILLECTOMY      Current Medications: Outpatient Medications Prior to Visit  Medication Sig Dispense Refill  . albuterol (PROAIR HFA) 108 (90 Base) MCG/ACT inhaler  Inhale 2 puffs into the lungs every 6 (six) hours as needed for wheezing or shortness of breath. 1 Inhaler 1  . albuterol (PROVENTIL) (2.5 MG/3ML) 0.083% nebulizer solution Take 3 mLs (2.5 mg total) by nebulization every 4 (four) hours as needed for wheezing or shortness of breath. 75 mL 0  . amiodarone (PACERONE) 200 MG tablet Take 1 tablet (200 mg total) by mouth daily. 30 tablet 5  . amitriptyline (ELAVIL) 25 MG tablet Take 2 tablets (50 mg total) by mouth at bedtime. 60 tablet 0  . amLODipine-benazepril (LOTREL) 10-40 MG capsule Take 1 capsule by mouth daily.     [name] aspirin EC 81 MG tablet Take 81 mg by mouth daily.    [name] docusate sodium (COLACE) 100 MG capsule Take 1 capsule (100 mg total) by mouth 2 (two) times daily as needed for moderate constipation. 60 capsule 0  . esomeprazole (NEXIUM) 40 MG capsule Take 1 capsule (40 mg total) by mouth daily as needed (for heartburn or indigestion). 30 capsule 0  . gabapentin (NEURONTIN) 400 MG capsule Take 1 capsule (400 mg total) by mouth daily. 30 capsule 0  . naproxen (NAPROSYN) 500 MG tablet Take 1 tablet (500 mg total) by mouth daily as needed for moderate pain (take with food). 90 tablet 3  . potassium chloride (K-DUR,KLOR-CON) 10 MEQ tablet Take 1 tablet (10  mEq total) by mouth 2 (two) times daily. 30 tablet 0  . rivaroxaban (XARELTO) 20 MG TABS tablet Take 1 tablet (20 mg total) by mouth every morning. 30 tablet 0  . torsemide (DEMADEX) 20 MG tablet Take 2 tablets (40 mg total) by mouth daily. 60 tablet 5   No facility-administered medications prior to visit.      Allergies:   Patient has no known allergies.   Social History   Social History  . Marital status: Single    Spouse name: N/A  . Number of children: N/A  . Years of education: N/A   Social History Main Topics  . Smoking status: Current Some Day Smoker    Packs/day: 1.50    Years: 54.00    Types: Cigarettes    Start date: 08/16/1961  . Smokeless tobacco: Never Used      Comment: smoking 4-5 cigarettes/ day  . Alcohol use No  . Drug use: No  . Sexual activity: Not Asked   Other Topics Concern  . None   Social History Narrative  . None     Family History:  The patient's family history includes Breast cancer in her mother; Hyperlipidemia in her maternal grandfather and mother; Hypertension in her maternal grandfather and mother.   ROS:   Please see the history of present illness.    Review of Systems  Constitution: Negative.  HENT: Negative.   Eyes: Negative.   Cardiovascular: Negative.   Respiratory: Negative.   Hematologic/Lymphatic: Negative.   Musculoskeletal: Negative.  Negative for joint pain.  Gastrointestinal: Negative.   Genitourinary: Negative.   Neurological: Negative.    All other systems reviewed and are negative.   PHYSICAL EXAM:   VS:  BP 120/80   Pulse 66   Ht 6\' 2"  (1.88 m)   Wt (!) 316 lb (143.3 kg)   SpO2 95%   BMI 40.57 kg/m   Physical Exam  GEN: Obese, in no acute distress  Neck: no JVD, carotid bruits, or masses Cardiac:RRR; no murmurs, rubs, or gallops  Respiratory:  clear to auscultation bilaterally, normal work of breathing GI: soft, nontender, nondistended, + BS Ext: without cyanosis, clubbing, or edema, Good distal pulses bilaterally Neuro:  Alert and Oriented x 3 Psych: euthymic mood, full affect  Wt Readings from Last 3 Encounters:  08/24/16 (!) 316 lb (143.3 kg)  07/31/16 (!) 308 lb 14.4 oz (140.1 kg)  07/26/16 (!) 317 lb 12.8 oz (144.2 kg)      Studies/Labs Reviewed:   EKG:  EKG is not ordered today.    Recent Labs: 07/11/2016: ALT 14; B Natriuretic Peptide 527.1 07/13/2016: Hemoglobin 12.1; Magnesium 1.7; Platelets 415 07/26/2016: BUN 13; Creat 1.21; Potassium 3.7; Sodium 142 07/31/2016: TSH 1.880   Lipid Panel No results found for: CHOL, TRIG, HDL, CHOLHDL, VLDL, LDLCALC, LDLDIRECT  Additional studies/ records that were reviewed today include:   2-D echo 07/12/16 Study Conclusions     - Left ventricle: The cavity size was normal. There was mild   concentric hypertrophy. Systolic function was normal. The   estimated ejection fraction was in the range of 60% to 65%. Wall   motion was normal; there were no regional wall motion   abnormalities. - Aortic valve: Transvalvular velocity was within the normal range.   There was no stenosis. There was no regurgitation. - Mitral valve: Transvalvular velocity was within the normal range.   There was no evidence for stenosis. There was trivial   regurgitation. - Left atrium: The atrium  was moderately dilated. - Right ventricle: The cavity size was normal. Wall thickness was   normal. Systolic function was normal. - Atrial septum: No defect or patent foramen ovale was identified   by color flow Doppler. - Tricuspid valve: There was mild regurgitation. - Pulmonary arteries: Systolic pressure was within the normal   range. PA peak pressure: 29 mm Hg (S).     ASSESSMENT:    1. PAF (paroxysmal atrial fibrillation) ([place])   2. (HFpEF) heart failure with preserved ejection fraction ([place])   3. Benign essential HTN   4. Morbid obesity due to excess calories ([place])   5. History of pulmonary embolism      PLAN:  In order of problems listed above:  PAF on Xarelto and amiodarone 200 mg daily rate controlled.   Diastolic CHF compensated on low dose Torsemide  Benign essential hypertension controlled  Morbid obesity weight loss recommended  History of pulmonary embolus Xarelto    Medication Adjustments/Labs and Tests Ordered: Current medicines are reviewed at length with the patient today.  Concerns regarding medicines are outlined above.  Medication changes, Labs and Tests ordered today are listed in the Patient Instructions below. There are no Patient Instructions on file for this visit.   Sumner Boast, PA-C  08/24/2016 9:46 AM    [facility] [address], [place], [place]   [address] Phone: [phone]; Fax: [phone]

## 2016-08-24 ENCOUNTER — Encounter: Payer: Self-pay | Admitting: Physician Assistant

## 2016-08-24 ENCOUNTER — Ambulatory Visit (INDEPENDENT_AMBULATORY_CARE_PROVIDER_SITE_OTHER): Payer: Medicare Other | Admitting: Physician Assistant

## 2016-08-24 VITALS — BP 120/80 | HR 66 | Ht 74.0 in | Wt 316.0 lb

## 2016-08-24 DIAGNOSIS — I10 Essential (primary) hypertension: Secondary | ICD-10-CM

## 2016-08-24 DIAGNOSIS — I5030 Unspecified diastolic (congestive) heart failure: Secondary | ICD-10-CM | POA: Diagnosis not present

## 2016-08-24 DIAGNOSIS — Z86711 Personal history of pulmonary embolism: Secondary | ICD-10-CM

## 2016-08-24 DIAGNOSIS — I480 Paroxysmal atrial fibrillation: Secondary | ICD-10-CM | POA: Diagnosis not present

## 2016-08-24 NOTE — Patient Instructions (Signed)
Medication Instructions: Your physician recommends that you continue on your current medications as directed. Please refer to the Current Medication list given to you today.   Labwork: None Ordered  Procedures/Testing: None Ordered  Follow-Up: Your physician recommends that you schedule a follow-up appointment in 4 Months at the [facility]. We will call you to schedule follow up appointment once we have the recommendation from Dr. Gwenlyn Found.    Any Additional Special Instructions Will Be Listed Below (If Applicable).     If you need a refill on your cardiac medications before your next appointment, please call your pharmacy.

## 2016-09-04 ENCOUNTER — Inpatient Hospital Stay (HOSPITAL_COMMUNITY)
Admission: EM | Admit: 2016-09-04 | Discharge: 2016-09-07 | DRG: 603 | Disposition: A | Discharge status: Home / Self Care | Payer: Medicare Other | Attending: Internal Medicine | Admitting: Internal Medicine

## 2016-09-04 ENCOUNTER — Encounter (HOSPITAL_COMMUNITY): Payer: Self-pay

## 2016-09-04 ENCOUNTER — Emergency Department (HOSPITAL_COMMUNITY): Payer: Medicare Other

## 2016-09-04 DIAGNOSIS — Z7901 Long term (current) use of anticoagulants: Secondary | ICD-10-CM

## 2016-09-04 DIAGNOSIS — I5020 Unspecified systolic (congestive) heart failure: Secondary | ICD-10-CM | POA: Diagnosis present

## 2016-09-04 DIAGNOSIS — L539 Erythematous condition, unspecified: Secondary | ICD-10-CM | POA: Diagnosis not present

## 2016-09-04 DIAGNOSIS — I110 Hypertensive heart disease with heart failure: Secondary | ICD-10-CM | POA: Diagnosis not present

## 2016-09-04 DIAGNOSIS — I5031 Acute diastolic (congestive) heart failure: Secondary | ICD-10-CM | POA: Diagnosis present

## 2016-09-04 DIAGNOSIS — F17210 Nicotine dependence, cigarettes, uncomplicated: Secondary | ICD-10-CM | POA: Diagnosis not present

## 2016-09-04 DIAGNOSIS — A419 Sepsis, unspecified organism: Secondary | ICD-10-CM

## 2016-09-04 DIAGNOSIS — Z6841 Body Mass Index (BMI) 40.0 and over, adult: Secondary | ICD-10-CM | POA: Diagnosis not present

## 2016-09-04 DIAGNOSIS — K219 Gastro-esophageal reflux disease without esophagitis: Secondary | ICD-10-CM | POA: Diagnosis not present

## 2016-09-04 DIAGNOSIS — I5032 Chronic diastolic (congestive) heart failure: Secondary | ICD-10-CM | POA: Diagnosis present

## 2016-09-04 DIAGNOSIS — Z95820 Peripheral vascular angioplasty status with implants and grafts: Secondary | ICD-10-CM | POA: Diagnosis not present

## 2016-09-04 DIAGNOSIS — E876 Hypokalemia: Secondary | ICD-10-CM | POA: Diagnosis present

## 2016-09-04 DIAGNOSIS — L03116 Cellulitis of left lower limb: Secondary | ICD-10-CM | POA: Diagnosis not present

## 2016-09-04 DIAGNOSIS — I480 Paroxysmal atrial fibrillation: Secondary | ICD-10-CM | POA: Diagnosis present

## 2016-09-04 DIAGNOSIS — I700 Atherosclerosis of aorta: Secondary | ICD-10-CM | POA: Diagnosis not present

## 2016-09-04 DIAGNOSIS — I10 Essential (primary) hypertension: Secondary | ICD-10-CM | POA: Diagnosis present

## 2016-09-04 DIAGNOSIS — I872 Venous insufficiency (chronic) (peripheral): Secondary | ICD-10-CM | POA: Diagnosis present

## 2016-09-04 DIAGNOSIS — J441 Chronic obstructive pulmonary disease with (acute) exacerbation: Secondary | ICD-10-CM | POA: Diagnosis present

## 2016-09-04 DIAGNOSIS — M7989 Other specified soft tissue disorders: Secondary | ICD-10-CM | POA: Diagnosis not present

## 2016-09-04 DIAGNOSIS — I739 Peripheral vascular disease, unspecified: Secondary | ICD-10-CM | POA: Diagnosis present

## 2016-09-04 DIAGNOSIS — Z79899 Other long term (current) drug therapy: Secondary | ICD-10-CM | POA: Diagnosis not present

## 2016-09-04 DIAGNOSIS — G8929 Other chronic pain: Secondary | ICD-10-CM | POA: Diagnosis present

## 2016-09-04 DIAGNOSIS — R509 Fever, unspecified: Secondary | ICD-10-CM | POA: Diagnosis not present

## 2016-09-04 DIAGNOSIS — R5381 Other malaise: Secondary | ICD-10-CM | POA: Diagnosis not present

## 2016-09-04 DIAGNOSIS — M545 Low back pain, unspecified: Secondary | ICD-10-CM | POA: Diagnosis present

## 2016-09-04 DIAGNOSIS — E785 Hyperlipidemia, unspecified: Secondary | ICD-10-CM | POA: Diagnosis present

## 2016-09-04 DIAGNOSIS — Z86711 Personal history of pulmonary embolism: Secondary | ICD-10-CM | POA: Diagnosis not present

## 2016-09-04 DIAGNOSIS — J449 Chronic obstructive pulmonary disease, unspecified: Secondary | ICD-10-CM | POA: Diagnosis present

## 2016-09-04 DIAGNOSIS — Z86718 Personal history of other venous thrombosis and embolism: Secondary | ICD-10-CM

## 2016-09-04 DIAGNOSIS — Z720 Tobacco use: Secondary | ICD-10-CM | POA: Diagnosis present

## 2016-09-04 DIAGNOSIS — Z7982 Long term (current) use of aspirin: Secondary | ICD-10-CM | POA: Diagnosis not present

## 2016-09-04 DIAGNOSIS — R05 Cough: Secondary | ICD-10-CM | POA: Diagnosis not present

## 2016-09-04 DIAGNOSIS — I4891 Unspecified atrial fibrillation: Secondary | ICD-10-CM | POA: Diagnosis present

## 2016-09-04 DIAGNOSIS — R079 Chest pain, unspecified: Secondary | ICD-10-CM | POA: Diagnosis not present

## 2016-09-04 DIAGNOSIS — R0602 Shortness of breath: Secondary | ICD-10-CM | POA: Diagnosis not present

## 2016-09-04 DIAGNOSIS — I878 Other specified disorders of veins: Secondary | ICD-10-CM | POA: Diagnosis present

## 2016-09-04 DIAGNOSIS — I5030 Unspecified diastolic (congestive) heart failure: Secondary | ICD-10-CM | POA: Diagnosis present

## 2016-09-04 LAB — URINALYSIS, ROUTINE W REFLEX MICROSCOPIC
Bacteria, UA: NONE SEEN
Bilirubin Urine: NEGATIVE
Glucose, UA: NEGATIVE mg/dL
Ketones, ur: NEGATIVE mg/dL
Leukocytes, UA: NEGATIVE
Nitrite: NEGATIVE
Protein, ur: NEGATIVE mg/dL
Specific Gravity, Urine: 1.015 (ref 1.005–1.030)
pH: 7 (ref 5.0–8.0)

## 2016-09-04 LAB — CBC WITH DIFFERENTIAL/PLATELET
Basophils Absolute: 0 10*3/uL (ref 0.0–0.1)
Basophils Relative: 0 %
Eosinophils Absolute: 0 10*3/uL (ref 0.0–0.7)
Eosinophils Relative: 0 %
HCT: 40.1 % (ref 36.0–46.0)
Hemoglobin: 13.8 g/dL (ref 12.0–15.0)
Lymphocytes Relative: 9 %
Lymphs Abs: 0.8 10*3/uL (ref 0.7–4.0)
MCH: 28.8 pg (ref 26.0–34.0)
MCHC: 34.4 g/dL (ref 30.0–36.0)
MCV: 83.7 fL (ref 78.0–100.0)
Monocytes Absolute: 0.3 10*3/uL (ref 0.1–1.0)
Monocytes Relative: 3 %
Neutro Abs: 7.7 10*3/uL (ref 1.7–7.7)
Neutrophils Relative %: 88 %
Platelets: 287 10*3/uL (ref 150–400)
RBC: 4.79 MIL/uL (ref 3.87–5.11)
RDW: 13.4 % (ref 11.5–15.5)
WBC: 8.8 10*3/uL (ref 4.0–10.5)

## 2016-09-04 LAB — I-STAT CG4 LACTIC ACID, ED
Lactic Acid, Venous: 3.95 mmol/L (ref 0.5–1.9)
Lactic Acid, Venous: 3.79 mmol/L (ref 0.5–1.9)

## 2016-09-04 LAB — COMPREHENSIVE METABOLIC PANEL
ALT: 18 U/L (ref 14–54)
AST: 32 U/L (ref 15–41)
Albumin: 4.2 g/dL (ref 3.5–5.0)
Alkaline Phosphatase: 101 U/L (ref 38–126)
Anion gap: 12 (ref 5–15)
BUN: 12 mg/dL (ref 6–20)
CO2: 24 mmol/L (ref 22–32)
Calcium: 9.3 mg/dL (ref 8.9–10.3)
Chloride: 104 mmol/L (ref 101–111)
Creatinine, Ser: 1.16 mg/dL — ABNORMAL HIGH (ref 0.44–1.00)
GFR calc Af Amer: 55 mL/min — ABNORMAL LOW (ref >60)
GFR calc non Af Amer: 48 mL/min — ABNORMAL LOW (ref >60)
Glucose, Bld: 146 mg/dL — ABNORMAL HIGH (ref 65–99)
Potassium: 3.2 mmol/L — ABNORMAL LOW (ref 3.5–5.1)
Sodium: 140 mmol/L (ref 135–145)
Total Bilirubin: 0.3 mg/dL (ref 0.3–1.2)
Total Protein: 8.2 g/dL — ABNORMAL HIGH (ref 6.5–8.1)

## 2016-09-04 LAB — TROPONIN I
Troponin I: 0.05 ng/mL (ref <0.03)
Troponin I: 0.06 ng/mL (ref <0.03)

## 2016-09-04 LAB — PROTIME-INR
INR: 1.47
Prothrombin Time: 18 seconds — ABNORMAL HIGH (ref 11.4–15.2)

## 2016-09-04 MED ORDER — VANCOMYCIN HCL IN DEXTROSE 1-5 GM/200ML-% IV SOLN
1000.0000 mg | Freq: Once | INTRAVENOUS | Status: AC
  Administered 2016-09-04: 1000 mg via INTRAVENOUS
  Filled 2016-09-04: qty 200

## 2016-09-04 MED ORDER — SODIUM CHLORIDE 0.9 % IV BOLUS (SEPSIS)
1000.0000 mL | Freq: Once | INTRAVENOUS | Status: AC
Start: 2016-09-04 — End: 2016-09-04
  Administered 2016-09-04: 1000 mL via INTRAVENOUS

## 2016-09-04 MED ORDER — PANTOPRAZOLE SODIUM 40 MG PO TBEC
40.0000 mg | DELAYED_RELEASE_TABLET | Freq: Every day | ORAL | Status: DC
  Administered 2016-09-05 – 2016-09-07 (×3): 40 mg via ORAL
  Filled 2016-09-04 (×3): qty 1

## 2016-09-04 MED ORDER — SODIUM CHLORIDE 0.9 % IV BOLUS (SEPSIS)
500.0000 mL | Freq: Once | INTRAVENOUS | Status: AC
  Administered 2016-09-04: 500 mL via INTRAVENOUS

## 2016-09-04 MED ORDER — METHYLPREDNISOLONE SODIUM SUCC 125 MG IJ SOLR
125.0000 mg | Freq: Once | INTRAMUSCULAR | Status: AC
  Administered 2016-09-04: 125 mg via INTRAVENOUS
  Filled 2016-09-04: qty 2

## 2016-09-04 MED ORDER — SODIUM CHLORIDE 0.9 % IV BOLUS (SEPSIS)
1000.0000 mL | Freq: Once | INTRAVENOUS | Status: AC
  Administered 2016-09-04: 1000 mL via INTRAVENOUS

## 2016-09-04 MED ORDER — IPRATROPIUM-ALBUTEROL 0.5-2.5 (3) MG/3ML IN SOLN
3.0000 mL | Freq: Once | RESPIRATORY_TRACT | Status: AC
  Administered 2016-09-04: 3 mL via RESPIRATORY_TRACT
  Filled 2016-09-04: qty 3

## 2016-09-04 MED ORDER — ACETAMINOPHEN 650 MG RE SUPP: 650.0000 mg | Freq: Four times a day (QID) | RECTAL | Status: DC | PRN

## 2016-09-04 MED ORDER — MOMETASONE FURO-FORMOTEROL FUM 200-5 MCG/ACT IN AERO
2.0000 | INHALATION_SPRAY | Freq: Two times a day (BID) | RESPIRATORY_TRACT | Status: DC
  Administered 2016-09-05 – 2016-09-07 (×4): 2 via RESPIRATORY_TRACT
  Filled 2016-09-04: qty 8.8

## 2016-09-04 MED ORDER — POTASSIUM CHLORIDE CRYS ER 10 MEQ PO TBCR: 10.0000 meq | EXTENDED_RELEASE_TABLET | Freq: Two times a day (BID) | ORAL | Status: DC

## 2016-09-04 MED ORDER — AMIODARONE HCL 200 MG PO TABS
200.0000 mg | ORAL_TABLET | Freq: Every day | ORAL | Status: DC
  Administered 2016-09-05 – 2016-09-07 (×3): 200 mg via ORAL
  Filled 2016-09-04 (×3): qty 1

## 2016-09-04 MED ORDER — CEFAZOLIN SODIUM-DEXTROSE 2-4 GM/100ML-% IV SOLN
2.0000 g | Freq: Three times a day (TID) | INTRAVENOUS | Status: DC
  Administered 2016-09-05 – 2016-09-07 (×7): 2 g via INTRAVENOUS
  Filled 2016-09-04 (×8): qty 100

## 2016-09-04 MED ORDER — ACETAMINOPHEN 325 MG PO TABS
650.0000 mg | ORAL_TABLET | Freq: Four times a day (QID) | ORAL | Status: DC | PRN
  Administered 2016-09-05 – 2016-09-06 (×3): 650 mg via ORAL
  Filled 2016-09-04 (×3): qty 2

## 2016-09-04 MED ORDER — SODIUM CHLORIDE 0.9 % IV BOLUS (SEPSIS): 1000.0000 mL | Freq: Once | INTRAVENOUS | Status: DC

## 2016-09-04 MED ORDER — RIVAROXABAN 20 MG PO TABS
20.0000 mg | ORAL_TABLET | Freq: Every day | ORAL | Status: DC
  Administered 2016-09-05 – 2016-09-07 (×3): 20 mg via ORAL
  Filled 2016-09-04 (×3): qty 1

## 2016-09-04 MED ORDER — GABAPENTIN 400 MG PO CAPS
400.0000 mg | ORAL_CAPSULE | Freq: Every day | ORAL | Status: DC
  Administered 2016-09-05 – 2016-09-07 (×3): 400 mg via ORAL
  Filled 2016-09-04 (×3): qty 1

## 2016-09-04 MED ORDER — PREDNISONE 20 MG PO TABS
40.0000 mg | ORAL_TABLET | Freq: Every day | ORAL | Status: DC
  Administered 2016-09-05 – 2016-09-07 (×3): 40 mg via ORAL
  Filled 2016-09-04 (×3): qty 2

## 2016-09-04 MED ORDER — PIPERACILLIN-TAZOBACTAM 3.375 G IVPB 30 MIN
3.3750 g | Freq: Once | INTRAVENOUS | Status: AC
  Administered 2016-09-04: 3.375 g via INTRAVENOUS
  Filled 2016-09-04: qty 50

## 2016-09-04 MED ORDER — IPRATROPIUM-ALBUTEROL 0.5-2.5 (3) MG/3ML IN SOLN
3.0000 mL | Freq: Four times a day (QID) | RESPIRATORY_TRACT | Status: DC
  Administered 2016-09-05 (×2): 3 mL via RESPIRATORY_TRACT
  Filled 2016-09-04 (×2): qty 3

## 2016-09-04 MED ORDER — NICOTINE 21 MG/24HR TD PT24
21.0000 mg | MEDICATED_PATCH | Freq: Every day | TRANSDERMAL | Status: DC
  Administered 2016-09-04 – 2016-09-05 (×2): 21 mg via TRANSDERMAL
  Filled 2016-09-04 (×3): qty 1

## 2016-09-04 MED ORDER — ASPIRIN EC 81 MG PO TBEC
81.0000 mg | DELAYED_RELEASE_TABLET | Freq: Every day | ORAL | Status: DC
  Administered 2016-09-05 – 2016-09-07 (×3): 81 mg via ORAL
  Filled 2016-09-04 (×3): qty 1

## 2016-09-04 MED ORDER — ALBUTEROL SULFATE (2.5 MG/3ML) 0.083% IN NEBU: 2.5000 mg | INHALATION_SOLUTION | RESPIRATORY_TRACT | Status: DC | PRN

## 2016-09-04 MED ORDER — NICOTINE 7 MG/24HR TD PT24: 7.0000 mg | MEDICATED_PATCH | Freq: Every day | TRANSDERMAL | Status: DC

## 2016-09-04 MED ORDER — POTASSIUM CHLORIDE CRYS ER 20 MEQ PO TBCR
40.0000 meq | EXTENDED_RELEASE_TABLET | Freq: Once | ORAL | Status: AC
  Administered 2016-09-04: 40 meq via ORAL
  Filled 2016-09-04: qty 2

## 2016-09-04 NOTE — ED Triage Notes (Signed)
Pt. Coming from home via [facility] for feeling bad for a few days. Pt. Fever with EMS 102 and 103 upon arrival.Pt. C/o chest pain worse with cough. EMS gave 1000 mg tylenol and 324 ASA. Pt. tachypneic upon arrival and states she's had a productive cough for a while now.

## 2016-09-04 NOTE — ED Provider Notes (Addendum)
[facility] DEPT Provider Note   CSN: [id number] Arrival date & time: 09/04/16  1518     History   Chief Complaint Chief Complaint  Patient presents with  . Fatigue  . Fever    HPI Sharon Vasquez is a 66 y.o. female.  The history is provided by the patient.  Fever   This is a new problem. The current episode started 2 days ago. The problem occurs daily. The problem has been gradually worsening. The maximum temperature noted was 103 to 104 F. Associated symptoms include chest pain (tightness), congestion and cough. Pertinent negatives include no diarrhea, no vomiting, no headaches and no sore throat. She has tried nothing for the symptoms. The treatment provided no relief.   66 year old female who presents with generalized weakness and fever for 2 days. She has a history of atrial fibrillation and prior PE on Xarelto, diastolic heart failure, COPD, hypertension, hyperlipidemia. Endorses cough productive of yellow sputum with shortness of breath and chest tightness during this time. Has also had urinary frequency and incontinence of urine but no dysuria. No abdominal pain, nausea, vomiting, diarrhea. No known sick contacts. Was brought to ED by EMS, where she was found to be febrile to 103. She was given a gram of Tylenol and a full dose of aspirin in route.  Past Medical History:  Diagnosis Date  . A-fib ([place])   . CHF (congestive heart failure) ([place])   . COPD (chronic obstructive pulmonary disease) ([place])   . Hyperlipidemia   . Hypertension   . Morbid obesity ([place])   . PE (pulmonary embolism)     Patient Active Problem List   Diagnosis Date Noted  . Chronic low back pain 08/21/2016  . Drug induced constipation 08/21/2016  . Elevated blood sugar 07/19/2016  . Abdominal pain 07/19/2016  . Aortic atherosclerosis ([place]) 07/17/2016  . Venous stasis ulcer with edema of lower leg ([place]) 07/12/2016  . Chronic anticoagulation 07/12/2016  . Tobacco use 07/11/2016  . COPD (chronic  obstructive pulmonary disease) ([place])   . (HFpEF) heart failure with preserved ejection fraction ([place])   . History of pulmonary embolism   . PAF (paroxysmal atrial fibrillation) ([place])   . Morbid obesity due to excess calories ([place])   . Peripheral vascular disease ([place])   . Benign essential HTN     Past Surgical History:  Procedure Laterality Date  . ABDOMINAL HYSTERECTOMY    . APPENDECTOMY    . BREAST LUMPECTOMY Left   . CHOLECYSTECTOMY    . TONSILLECTOMY      OB History    No data available       Home Medications    Prior to Admission medications   Medication Sig Start Date End Date Taking? Authorizing Provider  albuterol (PROAIR HFA) 108 (90 Base) MCG/ACT inhaler Inhale 2 puffs into the lungs every 6 (six) hours as needed for wheezing or shortness of breath. 08/14/16  Yes Lucious Groves, DO  albuterol (PROVENTIL) (2.5 MG/3ML) 0.083% nebulizer solution Take 3 mLs (2.5 mg total) by nebulization every 4 (four) hours as needed for wheezing or shortness of breath. 08/14/16 09/13/16 Yes Lucious Groves, DO  amiodarone (PACERONE) 200 MG tablet Take 1 tablet (200 mg total) by mouth daily. 07/26/16  Yes Almyra Deforest, PA  amitriptyline (ELAVIL) 25 MG tablet Take 2 tablets (50 mg total) by mouth at bedtime. 08/14/16  Yes Lucious Groves, DO  aspirin EC 81 MG tablet Take 81 mg by mouth daily.   Yes [provider]  gabapentin (NEURONTIN) 400 MG capsule Take 1 capsule (400 mg total) by mouth daily. 08/14/16  Yes Lucious Groves, DO  naproxen (NAPROSYN) 500 MG tablet Take 1 tablet (500 mg total) by mouth daily as needed for moderate pain (take with food). 08/21/16  Yes Oval Linsey, MD  potassium chloride (K-DUR,KLOR-CON) 10 MEQ tablet Take 1 tablet (10 mEq total) by mouth 2 (two) times daily. Patient taking differently: Take 10 mEq by mouth daily.  07/31/16  Yes Rivet, Sindy Guadeloupe, MD  rivaroxaban (XARELTO) 20 MG TABS tablet Take 1 tablet (20 mg total) by mouth every morning. 08/14/16  Yes  Lucious Groves, DO  torsemide (DEMADEX) 20 MG tablet Take 2 tablets (40 mg total) by mouth daily. 07/26/16  Yes Almyra Deforest, PA  amLODipine-benazepril (LOTREL) 10-40 MG capsule Take 1 capsule by mouth as needed (takes occassionally).     [provider]  esomeprazole (NEXIUM) 40 MG capsule Take 1 capsule (40 mg total) by mouth daily as needed (for heartburn or indigestion). 08/14/16   Lucious Groves, DO    Family History Family History  Problem Relation Age of Onset  . Breast cancer Mother   . Hypertension Mother   . Hyperlipidemia Mother   . Hypertension Maternal Grandfather   . Hyperlipidemia Maternal Grandfather     Social History Social History  Substance Use Topics  . Smoking status: Current Some Day Smoker    Packs/day: 1.50    Years: 54.00    Types: Cigarettes    Start date: 08/16/1961  . Smokeless tobacco: Never Used     Comment: smoking 4-5 cigarettes/ day  . Alcohol use No     Allergies   Patient has no known allergies.   Review of Systems Review of Systems  Constitutional: Positive for fever.  HENT: Positive for congestion. Negative for sore throat.   Respiratory: Positive for cough.   Cardiovascular: Positive for chest pain (tightness).  Gastrointestinal: Negative for abdominal pain, diarrhea and vomiting.  Genitourinary: Positive for frequency.  Neurological: Negative for headaches.  Hematological: Bruises/bleeds easily.  All other systems reviewed and are negative.    Physical Exam Updated Vital Signs BP 132/62   Pulse 86   Temp (!) 101.1 F (38.4 C) (Oral)   Resp 19   Ht 6\' 2"  (1.88 m)   Wt 136.1 kg (300 lb)   SpO2 100%   BMI 38.52 kg/m   Physical Exam Physical Exam  Nursing note and vitals reviewed. Constitutional: Appears fatigued and low energy, non-toxic, and in no acute distress Head: Normocephalic and atraumatic.  Mouth/Throat: Oropharynx is clear and dry mucous membranes.  Neck: Normal range of motion. Neck supple.    Cardiovascular: Tachycardic rate and regular rhythm.   Pulmonary/Chest: Effort normal. No conversational dyspnea. Expiratory wheezing, greater over the left lung than the right lung. Abdominal: Soft. There is no tenderness. There is no rebound and no guarding.  Musculoskeletal: Normal range of motion.  erythema warmth and tenderness overlying the left lower leg from ankle to level of the knee.  Neurological: Alert, no facial droop, fluent speech, moves all extremities symmetrically Skin: Skin is warm and dry.  Psychiatric: Cooperative   ED Treatments / Results  Labs (all labs ordered are listed, but only abnormal results are displayed) Labs Reviewed  COMPREHENSIVE METABOLIC PANEL - Abnormal; Notable for the following:       Result Value   Potassium 3.2 (*)    Glucose, Bld 146 (*)    Creatinine,  Ser 1.16 (*)    Total Protein 8.2 (*)    GFR calc non Af Amer 48 (*)    GFR calc Af Amer 55 (*)    All other components within normal limits  PROTIME-INR - Abnormal; Notable for the following:    Prothrombin Time 18.0 (*)    All other components within normal limits  URINALYSIS, ROUTINE W REFLEX MICROSCOPIC - Abnormal; Notable for the following:    Hgb urine dipstick MODERATE (*)    Squamous Epithelial / LPF 0-5 (*)    All other components within normal limits  TROPONIN I - Abnormal; Notable for the following:    Troponin I 0.05 (*)    All other components within normal limits  I-STAT CG4 LACTIC ACID, ED - Abnormal; Notable for the following:    Lactic Acid, Venous 3.95 (*)    All other components within normal limits  I-STAT CG4 LACTIC ACID, ED - Abnormal; Notable for the following:    Lactic Acid, Venous 3.79 (*)    All other components within normal limits  CULTURE, BLOOD (ROUTINE X 2)  CULTURE, BLOOD (ROUTINE X 2)  CBC WITH DIFFERENTIAL/PLATELET    EKG  EKG Interpretation  Date/Time:  Monday September 04 2016 15:54:13 EDT Ventricular Rate:  106 PR Interval:    QRS  Duration: 104 QT Interval:  338 QTC Calculation: 449 R Axis:   -36 Text Interpretation:  Sinus tachycardia Probable left atrial enlargement LVH with secondary repolarization abnormality Anterior Q waves, possibly due to LVH previously in atrial fibrilation Confirmed by Brantley Stage [phone]) on 09/04/2016 3:59:29 PM       Radiology Dg Chest 2 View  Result Date: 09/04/2016 CLINICAL DATA:  Chest pain, shortness breath, productive cough and fever EXAM: CHEST  2 VIEW COMPARISON:  07/11/2016 and 12/09/2015 FINDINGS: The heart size and mediastinal contours are within normal limits. Both lungs are clear. The visualized skeletal structures are unremarkable. IMPRESSION: No active cardiopulmonary disease. Electronically Signed   By: Lorriane Shire M.D.   On: 09/04/2016 16:42    Procedures Procedures (including critical care time) CRITICAL CARE Performed by: Forde Dandy   Total critical care time: 35 minutes  Critical care time was exclusive of separately billable procedures and treating other patients.  Critical care was necessary to treat or prevent imminent or life-threatening deterioration.  Critical care was time spent personally by me on the following activities: development of treatment plan with patient and/or surrogate as well as nursing, discussions with consultants, evaluation of patient's response to treatment, examination of patient, obtaining history from patient or surrogate, ordering and performing treatments and interventions, ordering and review of laboratory studies, ordering and review of radiographic studies, pulse oximetry and re-evaluation of patient's condition.  Medications Ordered in ED Medications  sodium chloride 0.9 % bolus 1,000 mL (0 mLs Intravenous Stopped 09/04/16 1827)  ipratropium-albuterol (DUONEB) 0.5-2.5 (3) MG/3ML nebulizer solution 3 mL (3 mLs Nebulization Given 09/04/16 1603)  sodium chloride 0.9 % bolus 1,000 mL (0 mLs Intravenous Stopped 09/04/16 1825)    And   sodium chloride 0.9 % bolus 500 mL (0 mLs Intravenous Stopped 09/04/16 1910)  piperacillin-tazobactam (ZOSYN) IVPB 3.375 g (0 g Intravenous Stopped 09/04/16 1731)  vancomycin (VANCOCIN) IVPB 1000 mg/200 mL premix (0 mg Intravenous Stopped 09/04/16 1910)  methylPREDNISolone sodium succinate (SOLU-MEDROL) 125 mg/2 mL injection 125 mg (125 mg Intravenous Given 09/04/16 1916)  ipratropium-albuterol (DUONEB) 0.5-2.5 (3) MG/3ML nebulizer solution 3 mL (3 mLs Nebulization Given 09/04/16 1942)  sodium chloride  0.9 % bolus 1,000 mL (1,000 mLs Intravenous New Bag/Given 09/04/16 1942)     Initial Impression / Assessment and Plan / ED Course  I have reviewed the triage vital signs and the nursing notes.  Pertinent labs & imaging results that were available during my care of the patient were reviewed by me and considered in my medical decision making (see chart for details).     Presents with concern for sepsis. Is febrile and tachycardic on arrival. No hypotension or respiratory distress. She does have diffuse expiratory wheezing on lung exam, and treated for COPD exacerbation with breathing treatments and Solu-Medrol. Evidence of the left lower extremity cellulitis as source of spesis. UA without signs of infection and chest x-ray visualized and shows no acute pneumonia or other acute cardiopulmonary processes. No significant leukocytosis but does have an elevated lactate of 3.9. Empirically started on vancomycin and Zosyn. Also receiving 4-1/2 L of IV fluids per sepsis protocol. I discussed with the internal medicine teaching service who will admit for ongoing management.  Final Clinical Impressions(s) / ED Diagnoses   Final diagnoses:  Cellulitis of left lower leg  Sepsis, due to unspecified organism [facility])  Acute exacerbation of chronic obstructive pulmonary disease (COPD) ([place])    New Prescriptions New Prescriptions   No medications on file     Forde Dandy, MD 09/04/16 2008    Forde Dandy,  MD 09/04/16 2009

## 2016-09-04 NOTE — ED Notes (Signed)
Date and time results received: 09/04/16  1706  Test: troponin I  Critical Value: 0.05  Name of Provider Notified: Oleta Mouse MD

## 2016-09-04 NOTE — ED Notes (Signed)
Pt. Given meal with EDP approval.

## 2016-09-04 NOTE — H&P (Signed)
Date: 09/04/2016               Patient Name:  Sharon Vasquez [name]: [id number]  DOB: [birth year] Age / Sex: 66 y.o., female   PCP: Lucious Groves, DO         Medical Service: Internal Medicine Teaching Service         Attending Physician: Dr. Joni Reining    First Contact: Dr. Alphonzo Grieve Pager: [phone]  Second Contact: Dr. Jule Ser Pager: [phone]       After Hours (After 5p/  First Contact Pager: [phone]  weekends / holidays): Second Contact Pager: [phone]   Chief Complaint: Fever, cough, leg pain  History of Present Illness: Sharon Vasquez is a 66 y.o. woman with PMH morbid obesity, severe venous insufficiency (s/p L iliac vein stent 01/2016), Afib and prior DVT/PE on Xarelto, HFpEF, COPD, tobacco abuse, HTN, HLD who presents with 2 days of persistent fatigue, chills, myalgias, productive cough, chest pressure, and swelling and pain in her left leg. She states that she has lost all of her energy and has been stuck in bed for the last two days, and has not eaten since yesterday. She feels that her legs have been more swollen and the left is more red and painful than usual. Worsening shortness of breath and chest pressure prompted her to call  EMS today. She denies abdominal pain, N/V, diarrhea, constipation, dysuria, sick contacts.  In the ED she was febrile to 103.1, HR 111, RR 18, BP 130/74, and SpO2 93% on room air with expiratory wheezing and left leg erythema and tenderness. Labs were remarkable for lactic acid of 3.95 and troponin 0.05. Otherwise CBC, BMP, UA, CXR, EKG unremarkable for significant results. IMTS contacted for admission.   Meds:  Current Meds  Medication Sig  . albuterol (PROAIR HFA) 108 (90 Base) MCG/ACT inhaler Inhale 2 puffs into the lungs every 6 (six) hours as needed for wheezing or shortness of breath.  [name] albuterol (PROVENTIL) (2.5 MG/3ML) 0.083% nebulizer solution Take 3 mLs (2.5 mg total) by nebulization every 4 (four) hours as needed for  wheezing or shortness of breath.  [name] amiodarone (PACERONE) 200 MG tablet Take 1 tablet (200 mg total) by mouth daily.  [name] amitriptyline (ELAVIL) 25 MG tablet Take 2 tablets (50 mg total) by mouth at bedtime.  [name] aspirin EC 81 MG tablet Take 81 mg by mouth daily.  [name] gabapentin (NEURONTIN) 400 MG capsule Take 1 capsule (400 mg total) by mouth daily.  . naproxen (NAPROSYN) 500 MG tablet Take 1 tablet (500 mg total) by mouth daily as needed for moderate pain (take with food).  . potassium chloride (K-DUR,KLOR-CON) 10 MEQ tablet Take 1 tablet (10 mEq total) by mouth 2 (two) times daily. (Patient taking differently: Take 10 mEq by mouth daily. )  . rivaroxaban (XARELTO) 20 MG TABS tablet Take 1 tablet (20 mg total) by mouth every morning.  . torsemide (DEMADEX) 20 MG tablet Take 2 tablets (40 mg total) by mouth daily.  Amlodipine-benazepril 10-40 mg PRN for HTN Nexium PRN  Allergies: Allergies as of 09/04/2016  . (No Known Allergies)   Past Medical History:  Diagnosis Date  . A-fib ([place])   . CHF (congestive heart failure) ([place])   . COPD (chronic obstructive pulmonary disease) ([place])   . Hyperlipidemia   . Hypertension   . Morbid obesity ([place])   . PE (pulmonary embolism)    Family History:  Family History  Problem  Relation Age of Onset  . Breast cancer Mother   . Hypertension Mother   . Hyperlipidemia Mother   . Hypertension Maternal Grandfather   . Hyperlipidemia Maternal Grandfather    Social History:  Social History   Social History  . Marital status: Single    Spouse name: N/A  . Number of children: N/A  . Years of education: N/A   Occupational History  . Not on file.   Social History Main Topics  . Smoking status: Current Some Day Smoker    Packs/day: 1.50    Years: 54.00    Types: Cigarettes    Start date: 08/16/1961  . Smokeless tobacco: Never Used     Comment: smoking 4-5 cigarettes/ day  . Alcohol use No  . Drug use: No  . Sexual activity: Not on file   Other  Topics Concern  . Not on file   Social History Narrative   Lives in [facility] in [place]. Lives alone, but is dependent in ADLs/IADLs. Previously had [employer] [facility] but dismissed them with plans to use the [employer]. Two daughters live nearby. Previously resided in [place].    Review of Systems: A complete ROS was negative except as per HPI.   Physical Exam: Blood pressure 132/62, pulse 86, temperature (!) 101.1 F (38.4 C), temperature source Oral, resp. rate 19, height 6\' 2"  (1.88 m), weight 300 lb (136.1 kg), SpO2 100 %.  General appearance: Morbidly obese woman resting comfortably in bed, in mild distress, intermittent cough, tangential historian HENT: Normocephalic, moist mucous membranes, poor dentition with projecting underbite, oropharynx clear, Mallampati 3-4 soft palate Eyes: PERRL, non-icteric Cardiovascular: Regular rate and rhythm, no murmurs, rubs, gallops Respiratory: Diffuse expiratory wheezing bilaterally, mild tachypnea Abdomen: Obese, BS+, soft, non-tender, non-distended Extremities: ROM limited due to bulk, left lower leg is erythematous and warm, tracks up behind left knee, tender to palpation, no visible ulcers or purulent drainage, nonpitting edema of BLEs, trace peripheral pulses Skin: Warm, dry Neuro: Alert and oriented Psych: Odd affect, clear speech, thoughts tangential and requires frequent redirection  Assessment & Plan by Problem: Principal Problem:   Cellulitis of left leg Active Problems:   Morbid obesity due to excess calories (HCC)   Peripheral vascular disease (HCC)   Benign essential HTN   COPD (chronic obstructive pulmonary disease) (HCC)   (HFpEF) heart failure with preserved ejection fraction (HCC)   History of pulmonary embolism   PAF (paroxysmal atrial fibrillation) (HCC)   Tobacco use   Chronic anticoagulation   Aortic atherosclerosis (HCC)   Chronic low back pain  Left leg cellulitis, ascending erythema/warms/pain/swelling  of the left lower leg for at least 2 days that is dry non-purulent, systemic symptoms with chills, fever, fatigue, myalgias, febrile to 103 today, no leukocytosis, lactic acid 3.95, otherwise hemodynamically stable. Received 2.5 L normal saline with initial concern for sepsis. -- S/p 1 dose IV Vancomycin and Zosyn in the ED -- Tomorrow start Ancef 2g TID -- Follow blood cultures, trend CBC -- Tylenol PRN for pain or fever  COPD exacerbation, reports increasing dyspnea, productive cough, wheezing over the last few days with fatigue. Poorly controlled COPD at baseline, on no controlled inhalers since moving from [place] a few months ago. Previous took Spiriva, Advair, now only has Albuterol.  -- S/p Solumedrol 125 mg 1x -- Prednisone 40 mg daily for 4 more days starting tomorrow -- Duonebs Q6H sched overnight with Albuterol Q4H PRN -- Dulera BID  Atrial fibrillation, (CHADSVASC score 5)  and history of VTE/PE on Xarelto. Currently in NSR -- Telemetry -- home Xarelto 20 mg daily -- home Amiodarone 200 mg daily  Tobacco abuse, active smoker 1.5 ppd for 54 years -- Nicotine patch while inpatient, encourage cessation  History of diastolic CHF, TTE in 05/6331 showed EF 54-56% with no diastolic dysfunction. Takes torsemide 40 mg daily at home for chronic leg edema and venous insufficiency. Received 2.5 L NS in the ED. Will monitor fluid status. -- Daily weights -- home Aspirin 81 mg daily, primary prevention (?) -- Consider restarting home Torsemide within 1-2 days  GERD -- Protonix 40 mg daily  Morbid obesity, unable to care for herself at home, cannot apply compression stockings to her feet, poor hygiene, morbidly obese, chronically ill, and deconditioned -- PT OT eval and treat  FEN/GI: HH diet, replete electrolytes as needed  DVT ppx: Xarelto  Code status: Full code  Dispo: Admit patient to Observation with expected length of stay less than 2 midnights.  Signed: Asencion Partridge,  MD 09/04/2016, 8:08 PM  Pager: [phone]

## 2016-09-04 NOTE — ED Notes (Signed)
Pt. Unable to give urine specimen at this time. Pt. Made aware we may have to in and out cath to get urine.

## 2016-09-05 DIAGNOSIS — M7989 Other specified soft tissue disorders: Secondary | ICD-10-CM

## 2016-09-05 DIAGNOSIS — I872 Venous insufficiency (chronic) (peripheral): Secondary | ICD-10-CM

## 2016-09-05 DIAGNOSIS — L539 Erythematous condition, unspecified: Secondary | ICD-10-CM

## 2016-09-05 DIAGNOSIS — I480 Paroxysmal atrial fibrillation: Secondary | ICD-10-CM

## 2016-09-05 DIAGNOSIS — Z7982 Long term (current) use of aspirin: Secondary | ICD-10-CM

## 2016-09-05 DIAGNOSIS — Z7901 Long term (current) use of anticoagulants: Secondary | ICD-10-CM

## 2016-09-05 DIAGNOSIS — Z86718 Personal history of other venous thrombosis and embolism: Secondary | ICD-10-CM

## 2016-09-05 DIAGNOSIS — J441 Chronic obstructive pulmonary disease with (acute) exacerbation: Secondary | ICD-10-CM

## 2016-09-05 DIAGNOSIS — I700 Atherosclerosis of aorta: Secondary | ICD-10-CM

## 2016-09-05 DIAGNOSIS — F17210 Nicotine dependence, cigarettes, uncomplicated: Secondary | ICD-10-CM

## 2016-09-05 DIAGNOSIS — K219 Gastro-esophageal reflux disease without esophagitis: Secondary | ICD-10-CM

## 2016-09-05 DIAGNOSIS — Z86711 Personal history of pulmonary embolism: Secondary | ICD-10-CM

## 2016-09-05 DIAGNOSIS — Z9114 Patient's other noncompliance with medication regimen: Secondary | ICD-10-CM

## 2016-09-05 DIAGNOSIS — I5032 Chronic diastolic (congestive) heart failure: Secondary | ICD-10-CM

## 2016-09-05 DIAGNOSIS — Z95828 Presence of other vascular implants and grafts: Secondary | ICD-10-CM

## 2016-09-05 DIAGNOSIS — Z8679 Personal history of other diseases of the circulatory system: Secondary | ICD-10-CM

## 2016-09-05 DIAGNOSIS — I110 Hypertensive heart disease with heart failure: Secondary | ICD-10-CM

## 2016-09-05 DIAGNOSIS — E785 Hyperlipidemia, unspecified: Secondary | ICD-10-CM

## 2016-09-05 LAB — BLOOD CULTURE ID PANEL (REFLEXED)

## 2016-09-05 LAB — BASIC METABOLIC PANEL
Anion gap: 11 (ref 5–15)
BUN: 9 mg/dL (ref 6–20)
CO2: 22 mmol/L (ref 22–32)
Calcium: 8.3 mg/dL — ABNORMAL LOW (ref 8.9–10.3)
Chloride: 105 mmol/L (ref 101–111)
Creatinine, Ser: 1.07 mg/dL — ABNORMAL HIGH (ref 0.44–1.00)
GFR calc Af Amer: >60 mL/min (ref >60)
GFR calc non Af Amer: 52 mL/min — ABNORMAL LOW (ref >60)
Glucose, Bld: 216 mg/dL — ABNORMAL HIGH (ref 65–99)
Potassium: 3.3 mmol/L — ABNORMAL LOW (ref 3.5–5.1)
Sodium: 138 mmol/L (ref 135–145)

## 2016-09-05 LAB — CBC
HCT: 39.3 % (ref 36.0–46.0)
Hemoglobin: 13.3 g/dL (ref 12.0–15.0)
MCH: 28.7 pg (ref 26.0–34.0)
MCHC: 33.8 g/dL (ref 30.0–36.0)
MCV: 84.9 fL (ref 78.0–100.0)
Platelets: 266 10*3/uL (ref 150–400)
RBC: 4.63 MIL/uL (ref 3.87–5.11)
RDW: 13.8 % (ref 11.5–15.5)
WBC: 14.5 10*3/uL — ABNORMAL HIGH (ref 4.0–10.5)

## 2016-09-05 LAB — LACTIC ACID, PLASMA
Lactic Acid, Venous: 2.8 mmol/L (ref 0.5–1.9)
Lactic Acid, Venous: 2 mmol/L (ref 0.5–1.9)

## 2016-09-05 LAB — HIV ANTIBODY (ROUTINE TESTING W REFLEX): HIV Screen 4th Generation wRfx: NONREACTIVE

## 2016-09-05 LAB — MAGNESIUM: Magnesium: 1.3 mg/dL — ABNORMAL LOW (ref 1.7–2.4)

## 2016-09-05 MED ORDER — GUAIFENESIN-DM 100-10 MG/5ML PO SYRP
5.0000 mL | ORAL_SOLUTION | ORAL | Status: DC | PRN
  Administered 2016-09-05 – 2016-09-06 (×2): 5 mL via ORAL
  Filled 2016-09-05 (×2): qty 5

## 2016-09-05 MED ORDER — POTASSIUM CHLORIDE CRYS ER 20 MEQ PO TBCR
40.0000 meq | EXTENDED_RELEASE_TABLET | Freq: Once | ORAL | Status: AC
  Administered 2016-09-05: 40 meq via ORAL
  Filled 2016-09-05: qty 2

## 2016-09-05 MED ORDER — MAGNESIUM SULFATE 2 GM/50ML IV SOLN
2.0000 g | Freq: Once | INTRAVENOUS | Status: AC
  Administered 2016-09-05: 2 g via INTRAVENOUS
  Filled 2016-09-05 (×2): qty 50

## 2016-09-05 MED ORDER — SALINE SPRAY 0.65 % NA SOLN
1.0000 | NASAL | Status: DC | PRN
  Administered 2016-09-06: 1 via NASAL
  Filled 2016-09-05 (×2): qty 44

## 2016-09-05 MED ORDER — IPRATROPIUM-ALBUTEROL 0.5-2.5 (3) MG/3ML IN SOLN
3.0000 mL | Freq: Four times a day (QID) | RESPIRATORY_TRACT | Status: DC | PRN
  Administered 2016-09-06: 3 mL via RESPIRATORY_TRACT
  Filled 2016-09-05: qty 3

## 2016-09-05 MED ORDER — SODIUM CHLORIDE 0.9 % IV SOLN
INTRAVENOUS | Status: AC
  Administered 2016-09-05: 05:00:00 via INTRAVENOUS

## 2016-09-05 NOTE — Progress Notes (Signed)
PHARMACY - PHYSICIAN COMMUNICATION CRITICAL VALUE ALERT - BLOOD CULTURE IDENTIFICATION (BCID)  Results for orders placed or performed during the hospital encounter of 09/04/16  Blood Culture ID Panel (Reflexed) (Collected: 09/04/2016  3:43 PM)  Result Value Ref Range   Enterococcus species NOT DETECTED NOT DETECTED   Listeria monocytogenes NOT DETECTED NOT DETECTED   Staphylococcus species NOT DETECTED NOT DETECTED   Staphylococcus aureus NOT DETECTED NOT DETECTED   Streptococcus species NOT DETECTED NOT DETECTED   Streptococcus agalactiae NOT DETECTED NOT DETECTED   Streptococcus pneumoniae NOT DETECTED NOT DETECTED   Streptococcus pyogenes NOT DETECTED NOT DETECTED   Acinetobacter baumannii NOT DETECTED NOT DETECTED   Enterobacteriaceae species NOT DETECTED NOT DETECTED   Enterobacter cloacae complex NOT DETECTED NOT DETECTED   Escherichia coli NOT DETECTED NOT DETECTED   Klebsiella oxytoca NOT DETECTED NOT DETECTED   Klebsiella pneumoniae NOT DETECTED NOT DETECTED   Proteus species NOT DETECTED NOT DETECTED   Serratia marcescens NOT DETECTED NOT DETECTED   Haemophilus influenzae NOT DETECTED NOT DETECTED   Neisseria meningitidis NOT DETECTED NOT DETECTED   Pseudomonas aeruginosa NOT DETECTED NOT DETECTED   Candida albicans NOT DETECTED NOT DETECTED   Candida glabrata NOT DETECTED NOT DETECTED   Candida krusei NOT DETECTED NOT DETECTED   Candida parapsilosis NOT DETECTED NOT DETECTED   Candida tropicalis NOT DETECTED NOT DETECTED    Name of physician (or Provider) Contacted: Johnson  Changes to prescribed antibiotics required: No changes recommended.  Erin Hearing PharmD., BCPS Clinical Pharmacist Pager [phone] 09/05/2016 8:28 PM

## 2016-09-05 NOTE — Progress Notes (Signed)
CRITICAL VALUE ALERT  Critical Value:  Lactic acid 2.8  Date & Time Notied:  09/05/2016 @ 0417  Provider Notified: Wynetta Emery ( on call)  Orders Received/Actions taken: no new orders

## 2016-09-05 NOTE — Discharge Instructions (Addendum)

## 2016-09-05 NOTE — Progress Notes (Signed)
Internal Medicine Attending Admission Note  I saw and evaluated the patient. I reviewed the resident's note and I agree with the resident's findings and plan as documented in the resident's note.  Assessment & Plan by Problem:   Erythema and asymmetric swelling of left leg most likely due to Cellulitis of left leg - Our ddx includes Cellulitis, DVT, chronic venous insufficency.  Given her fever on presentation it was felt this is most likely cellulitis.  She was started on broad spectrum Abx in the ED and this was deescalated on admission to ancef.  It appears the errythma has no spread past markings last night.   She does have a history of VTE making DVT a possibility however she does report compliance with Xarelto making this less likely.  She does have a history of severe venous insufficency specifically of that left leg if this does improve with antibiotics we may need to consider placing UNNA boots as was done in her April admission     Morbid obesity due to excess calories ([place])   Possible Peripheral vascular disease ([place]) - I am not clear about this diagnosis, she has CAD and aortic atherosclerosis noted on her CT scan I do not however see any ABI or other documentation of why this was added to her problem list, we may need to obtain this if we do want to pursue compression therapy    Benign essential HTN - Currently normotensive, OK to hold home BP meds    COPD (chronic obstructive pulmonary disease) ([place]) - Tx with steroids for exacerbation, can continue oral prednisone x 5 days total    Chronic (HFpEF) heart failure with preserved ejection fraction ([place]) - She received 4.5L of IVF in ED for sepsis protocol.  Wt appears to be up to 311 from 300 on presentation.  Weight at last cardiology visit was 316 and weight on discharge was 308.  She currently is saturating well on room air.   - Monitor for volume overload.    History of pulmonary embolism   PAF (paroxysmal atrial fibrillation)  (HCC)   Chronic anticoagulation - Continue xarelto for afib/ hx PE - Continue amiodarone    Aortic atherosclerosis (HCC) - Continue asprin, not on statin - Check fasting lipid panel with AM labs  Chief Complaint(s):fever  History - key components related to admission: Briefly Sharon Vasquez is a 66 yo female with history of obesity, venous insufficiency, afib, DVT/PE, HFpEF, COPD, HTN, HLD who presents with fever.  She reports associated symptoms of worsening swelling and pain in her left leg as well as fatigue, chills, myalgias, productive cough and decreased appetite.  In the ED she was noted to be febile to 103 with a HR of 111, normotensive and 93% O2 on room air. She was given IVF and Vanc/Zosyn per sepsis protocol for sepsis due to left leg cellulitis.  This morning she reports no change with left leg pain, she notes that overall she is feeling a little better and fever has resolved.  Lab results: Reviewed in Epic  Physical Exam - key components related to admission: General: resting in bed, no distresss HEENT: EOMI, no scleral icterus Cardiac: RRR Pulm: clear to auscultation bilaterally, no wheezing or rales Abd: soft, nontender, nondistended, BS present Ext: left leg warm, erythematous up to level of knee, chronic venous stasis changes bilaterally, bilateral pitting edema worse on the left   Vitals:   09/05/16 0058 09/05/16 0508 09/05/16 0857 09/05/16 0901  BP: (!) 146/83 [name])  118/57    Pulse: 80 95    Resp: 20 20    Temp: 100 F (37.8 C) 99.8 F (37.7 C)    TempSrc: Oral Oral    SpO2: 96% 100% 98% 98%  Weight:  (!) 311 lb 6.4 oz (141.3 kg)    Height:

## 2016-09-05 NOTE — Evaluation (Signed)
Physical Therapy Evaluation Patient Details Name: Sharon Vasquez [name]: [id number] DOB: 1950-08-10 Today's Date: 09/05/2016   History of Present Illness  Sharon Vasquez is a 66 y.o. woman with PMH morbid obesity, severe venous insufficiency (s/p L iliac vein stent 01/2016), Afib and prior DVT/PE on Xarelto, HFpEF, COPD, tobacco abuse, HTN, HLD who presents with 2 days of persistent fatigue, chills, myalgias, productive cough, chest pressure, and swelling and pain in her left leg.   Clinical Impression  Patient presents with some mild limitations in mobility due to intemittent pain and edema in LE's.  Also report difficulty managing her household needs and would likely benefit from some ongoing aide assistance if approved through MD.  Recommend follow up HHPT, aide and rollator for home at d/c.    Follow Up Recommendations Home health PT [facility] aide)    Equipment Recommendations  Other (comment) (bariatric rollator)    Recommendations for Other Services       Precautions / Restrictions Precautions Precautions: None      Mobility  Bed Mobility Overal bed mobility: Modified Independent             General bed mobility comments: elevated HOB  Transfers Overall transfer level: Needs assistance   Transfers: Sit to/from Stand Sit to Stand: Supervision         General transfer comment: heavy forward lean and increased time to stand from EOB  Ambulation/Gait Ambulation/Gait assistance: Supervision;Min guard Ambulation Distance (Feet): 160 Feet Assistive device: None Gait Pattern/deviations: Step-through pattern;Decreased stride length;Antalgic;Wide base of support     General Gait Details: some weakness evident with hips flexed and knees hyperextended through gait as well as wider BOS and noted SOB with ambulation  Stairs            Wheelchair Mobility    Modified Rankin (Stroke Patients Only)       Balance Overall balance assessment: Needs assistance    Sitting balance-Leahy Scale: Good     Standing balance support: No upper extremity supported Standing balance-Leahy Scale: Good Standing balance comment: able to perform some dynamic tasks no UE support, but reports good days and bad days with pain and breathing; needs rollator some days                             Pertinent Vitals/Pain Pain Assessment: No/denies pain    Home Living Family/patient expects to be discharged to:: Private residence Living Arrangements: Alone   Type of Home: Apartment Home Access: Elevator     Home Layout: One level Home Equipment: Clinical cytogeneticist - 4 wheels;Toilet riser;Grab bars - tub/shower;Electric scooter Additional Comments: reports rollator very old and shaky (66 years old) and that her main limitation is grip strength, feels she drops things and is unable to pick them up, discussed reacher and sock aide    Prior Function Level of Independence: Independent with assistive device(s)               Hand Dominance        Extremity/Trunk Assessment   Upper Extremity Assessment Upper Extremity Assessment: Overall WFL for tasks assessed    Lower Extremity Assessment Lower Extremity Assessment: RLE deficits/detail;LLE deficits/detail RLE Deficits / Details: strength grossly WFL, noted some LE edema RLE Sensation: history of peripheral neuropathy LLE Deficits / Details: strength grossly WFL, noted some LE edema and errythema on L LE LLE Sensation: history of peripheral neuropathy       Communication  Communication: No difficulties  Cognition Arousal/Alertness: Awake/alert Behavior During Therapy: WFL for tasks assessed/performed Overall Cognitive Status: Within Functional Limits for tasks assessed                                        General Comments General comments (skin integrity, edema, etc.): SpO2 90% at rest on RA, 93% with ambulation HR 104    Exercises     Assessment/Plan    PT  Assessment Patient needs continued PT services  PT Problem List Cardiopulmonary status limiting activity;Decreased activity tolerance;Decreased mobility;Decreased strength       PT Treatment Interventions DME instruction;Therapeutic activities;Balance training;Functional mobility training;Therapeutic exercise;Gait training;Patient/family education    PT Goals (Current goals can be found in the Care Plan section)  Acute Rehab PT Goals Patient Stated Goal: To go home and get some help PT Goal Formulation: With patient Time For Goal Achievement: 09/12/16 Potential to Achieve Goals: Good    Frequency Min 3X/week   Barriers to discharge        Co-evaluation               AM-PAC PT "6 Clicks" Daily Activity  Outcome Measure Difficulty turning over in bed (including adjusting bedclothes, sheets and blankets)?: None Difficulty moving from lying on back to sitting on the side of the bed? : None Difficulty sitting down on and standing up from a chair with arms (e.g., wheelchair, bedside commode, etc,.)?: A Little Help needed moving to and from a bed to chair (including a wheelchair)?: A Little Help needed walking in hospital room?: A Little Help needed climbing 3-5 steps with a railing? : A Little 6 Click Score: 20    End of Session Equipment Utilized During Treatment: Gait belt Activity Tolerance: Patient limited by fatigue Patient left: in bed;with call bell/phone within reach   PT Visit Diagnosis: Difficulty in walking, not elsewhere classified (R26.2)    Time: 2876-8115 PT Time Calculation (min) (ACUTE ONLY): 25 min   Charges:   PT Evaluation $PT Eval Moderate Complexity: 1 Procedure PT Treatments $Gait Training: 8-22 mins   PT G Codes:   PT G-Codes **NOT FOR INPATIENT CLASS** Functional Assessment Tool Used: AM-PAC 6 Clicks Basic Mobility Functional Limitation: Mobility: Walking and moving around Mobility: Walking and Moving Around Current Status (B2620): At least  20 percent but less than 40 percent impaired, limited or restricted Mobility: Walking and Moving Around Goal Status [phone]): At least 1 percent but less than 20 percent impaired, limited or restricted    [place], [place] 09/05/2016   Reginia Naas 09/05/2016, 11:11 AM

## 2016-09-05 NOTE — Progress Notes (Signed)
Pt educated about safety and importance of bed alarm during the night however pt refuses to be on bed alarm. Will continue to round on patient.   Nihal Doan, RN    

## 2016-09-05 NOTE — Evaluation (Signed)
Occupational Therapy Evaluation Patient Details Name: Sharon Vasquez [name]: [id number] DOB: July 15, 1950 Today's Date: 09/05/2016    History of Present Illness Sharon Vasquez is a 66 y.o. woman with PMH morbid obesity, severe venous insufficiency (s/p L iliac vein stent 01/2016), Afib and prior DVT/PE on Xarelto, HFpEF, COPD, tobacco abuse, HTN, HLD who presents with 2 days of persistent fatigue, chills, myalgias, productive cough, chest pressure, and swelling and pain in her left leg.    Clinical Impression   Pt admitted with above. She demonstrates the below listed deficits and will benefit from continued OT to maximize safety and independence with BADLs.  Pt presents with generalized weakness, decreased activity tolerance, impaired sensation and [place] bil. Hands, intermittent pain.  She is able to perform ADLs with supervision, but requires excessive amount of time to complete LB ADLs and DOE 3/4 - would benefit from AE.  Recommend HHOT.  Will follow.       Follow Up Recommendations  Home health OT    Equipment Recommendations  None recommended by OT    Recommendations for Other Services       Precautions / Restrictions Precautions Precautions: None      Mobility Bed Mobility Overal bed mobility: Modified Independent                Transfers Overall transfer level: Needs assistance Equipment used: None Transfers: Sit to/from Stand;Stand Pivot Transfers Sit to Stand: Supervision Stand pivot transfers: Supervision            Balance Overall balance assessment: Needs assistance Sitting-balance support: Feet supported Sitting balance-Leahy Scale: Good     Standing balance support: No upper extremity supported Standing balance-Leahy Scale: Good                             ADL either performed or assessed with clinical judgement   ADL Overall ADL's : Needs assistance/impaired Eating/Feeding: Independent;Sitting   Grooming: Wash/dry hands;Wash/dry  face;Oral care;Supervision/safety;Standing   Upper Body Bathing: Set up;Sitting   Lower Body Bathing: Supervison/ safety Lower Body Bathing Details (indicate cue type and reason): requires excessive amount of time to complete  Upper Body Dressing : Set up;Sitting   Lower Body Dressing: Supervision/safety;Sit to/from stand Lower Body Dressing Details (indicate cue type and reason): requires excessive amount of time to complete and DOE 3/4  Toilet Transfer: Supervision/safety;Ambulation;Grab bars   Toileting- Clothing Manipulation and Hygiene: Supervision/safety;Sit to/from stand       Functional mobility during ADLs: Supervision/safety       Vision         Perception     Praxis      Pertinent Vitals/Pain Pain Assessment: No/denies pain     Hand Dominance     Extremity/Trunk Assessment Upper Extremity Assessment Upper Extremity Assessment: Generalized weakness;RUE deficits/detail;LUE deficits/detail RUE Deficits / Details: Pt reports she frequently drops items in hands unless she is actively looking at her hands  RUE Coordination: decreased fine motor LUE Deficits / Details: Pt reports she frequently drops items in hands unless she is actively looking at her hands  LUE Coordination: decreased fine motor   Lower Extremity Assessment Lower Extremity Assessment: Defer to PT evaluation   Cervical / Trunk Assessment Cervical / Trunk Assessment: Normal   Communication Communication Communication: No difficulties   Cognition Arousal/Alertness: Awake/alert Behavior During Therapy: WFL for tasks assessed/performed Overall Cognitive Status: Within Functional Limits for tasks assessed  General Comments       Exercises     Shoulder Instructions      Home Living Family/patient expects to be discharged to:: Private residence Living Arrangements: Alone   Type of Home: Apartment Home Access: Elevator     Home  Layout: One level     Bathroom Shower/Tub: Teacher, early years/pre: Standard     Home Equipment: Clinical cytogeneticist - 4 wheels;Toilet riser;Grab bars - tub/shower;Electric scooter   Additional Comments: reports rollator very old and shaky (66 years old) and that her main limitation is grip strength, feels she drops things and is unable to pick them up, discussed reacher and sock aide      Prior Functioning/Environment Level of Independence: Independent with assistive device(s)        Comments: Pt does not drive. She drives her scooter to apts, and shops at [employer] once/month         OT Problem List: Decreased strength;Decreased activity tolerance;Impaired balance (sitting and/or standing);Decreased knowledge of use of DME or AE;Obesity;Pain      OT Treatment/Interventions: Self-care/ADL training;Therapeutic exercise;Energy conservation;DME and/or AE instruction;Therapeutic activities;Patient/family education;Balance training    OT Goals(Current goals can be found in the care plan section) Acute Rehab OT Goals Patient Stated Goal: to get stronger  OT Goal Formulation: With patient Time For Goal Achievement: 09/19/16 Potential to Achieve Goals: Good ADL Goals Pt Will Perform Lower Body Bathing: with modified independence;sit to/from stand Pt Will Perform Lower Body Dressing: with modified independence;with adaptive equipment;sit to/from stand  OT Frequency: Min 2X/week   Barriers to D/C: Decreased caregiver support          Co-evaluation              AM-PAC PT "6 Clicks" Daily Activity     Outcome Measure Help from another person eating meals?: None Help from another person taking care of personal grooming?: A Little Help from another person toileting, which includes using toliet, bedpan, or urinal?: A Little Help from another person bathing (including washing, rinsing, drying)?: A Little Help from another person to put on and taking off regular upper  body clothing?: A Little Help from another person to put on and taking off regular lower body clothing?: A Little 6 Click Score: 19   End of Session Equipment Utilized During Treatment: Gait belt Nurse Communication: Mobility status  Activity Tolerance: Patient tolerated treatment well Patient left: in bed;with call bell/phone within reach;with bed alarm set  OT Visit Diagnosis: Unsteadiness on feet (R26.81);Muscle weakness (generalized) (M62.81)                Time: 1231-1300 OT Time Calculation (min): 29 min Charges:  OT General Charges $OT Visit: 1 Procedure OT Evaluation $OT Eval Moderate Complexity: 1 Procedure OT Treatments $Self Care/Home Management : 8-22 mins G-Codes:     [facility], OTR/L [phone]   Lucille Passy M 09/05/2016, 4:48 PM

## 2016-09-05 NOTE — Care Management Note (Signed)
Case Management Note  Patient Details  Name: NOE PITTSLEY [name]: [id number] Date of Birth: Dec 27, 1950  Subjective/Objective:    Admitted with Cellulitis of the left leg               Action/Plan: Patient known to me from previous admission; CM will continue to follow for DCP; B Leanne Chang  07/13/2016 - Action/Plan: Patient lives in [place] for several years; she also travels back and forth to [place]; she plas to follow up with [facility]; has private insurance with [facility] with prescription drug coverage; pharmacy of choice is [facility]; DME- she has a Electronics engineer, walker and scooter at home; [facility] choices offered, patient chose [facility] at home; Mary with [facility] called for arrangements; CM will continue to follow for DCPAneta Mins [phone]  Expected Discharge Date:   possibly 09/09/2016               Expected Discharge Plan:  [place]  Discharge planning Services  CM Consult    Status of Service:  In process, will continue to follow  Sherrilyn Rist [phone] 09/05/2016, 10:24 AM

## 2016-09-05 NOTE — Progress Notes (Signed)
Pt's vitals stable, no any complain of pain, walked with PT, OT. Will continue to monitor.

## 2016-09-05 NOTE — Progress Notes (Signed)
CRITICAL VALUE ALERT  Critical Value: troponin 0.06  Date & Time Notied:  09/05/2016 @ 0308  Provider Notified: Wynetta Emery (on call)  Orders Received/Actions taken: no new orders

## 2016-09-05 NOTE — Progress Notes (Signed)
   Subjective:  Patient states she feels her breathing is a little improved today as well as her cough. She has noticed some improvement in her left leg pain though this still bothers her and is experiencing chills. She denies chest pain.  Objective:  Vital signs in last 24 hours: Vitals:   09/05/16 0508 09/05/16 0857 09/05/16 0901 09/05/16 1218  BP: (!) 118/57   (!) 154/86  Pulse: 95   90  Resp: 20   18  Temp: 99.8 F (37.7 C)   99.5 F (37.5 C)  TempSrc: Oral   Oral  SpO2: 100% 98% 98% 97%  Weight: (!) 311 lb 6.4 oz (141.3 kg)     Height:       Constitutional: NAD, resting comfortably CV: RRR, no murmurs, rubs or gallops  Resp: CTAB, no wheezing, no rales, no increased work of breathing Abd: soft, NDNT Ext: RLE with mild pitting edema; LLE with increased warmth, erythema and generalized tenderness   Assessment/Plan:  Principal Problem:   Cellulitis of left leg Active Problems:   Morbid obesity due to excess calories (HCC)   Peripheral vascular disease (HCC)   Benign essential HTN   COPD (chronic obstructive pulmonary disease) (HCC)   (HFpEF) heart failure with preserved ejection fraction (HCC)   History of pulmonary embolism   PAF (paroxysmal atrial fibrillation) (HCC)   Tobacco use   Chronic anticoagulation   Aortic atherosclerosis (HCC)   Chronic low back pain  LLE Cellulitis: Some improvement in pain, though clinically appears about the same. She has not had further fevers. Patient again maintains she has been compliant with her Xarelto and this pain is different from prior DVTs. She does have a history of significant venous stasis which could be contributing to some of her discomfort - if considering UNNA boots in future, will look into ABIs. --continue ancef --f/u AM CBC  COPD exacerbation: Patient clinically improving - no wheezing on exam today.  --continue prednisone for full 5 day course --continue dulera, duonebs --will make sure she has maintenance  inhalers at discharge  Hypokalemia, hypomagnesemia: K remains low at 3.3 today despite repletion yesterday and holding torsemide. Mag 1.3. --replete K and mag --f/u am Bmet and Mg  HTN: Stable; can restart home meds as necessary.  Chronic diastolic CHF: Weight and clinically stable after IVF yesterday.  --continue monitoring fluid status; hold torsemide for now --continue home asa 81mg  daily  A fib: Continue home Xarelto 20mg  daily and amiodarone 200mg  daily  Morbid obesity, chronically ill, deconditioning: --PT/OT consult --PT recommends PT HH with HH aid  Dispo: Anticipated discharge in approximately 1-2 day(s).   Alphonzo Grieve, MD 09/05/2016, 2:04 PM Pager [phone]

## 2016-09-05 NOTE — Progress Notes (Signed)
Pt's lactid acid is 2.0, MD aware.

## 2016-09-06 ENCOUNTER — Encounter (HOSPITAL_COMMUNITY): Payer: Self-pay | Admitting: *Deleted

## 2016-09-06 DIAGNOSIS — L03116 Cellulitis of left lower limb: Principal | ICD-10-CM

## 2016-09-06 DIAGNOSIS — R5381 Other malaise: Secondary | ICD-10-CM

## 2016-09-06 DIAGNOSIS — E876 Hypokalemia: Secondary | ICD-10-CM

## 2016-09-06 LAB — CBC
HCT: 33.6 % — ABNORMAL LOW (ref 36.0–46.0)
Hemoglobin: 11.7 g/dL — ABNORMAL LOW (ref 12.0–15.0)
MCH: 29.5 pg (ref 26.0–34.0)
MCHC: 34.8 g/dL (ref 30.0–36.0)
MCV: 84.8 fL (ref 78.0–100.0)
Platelets: 212 10*3/uL (ref 150–400)
RBC: 3.96 MIL/uL (ref 3.87–5.11)
RDW: 14.2 % (ref 11.5–15.5)
WBC: 12.1 10*3/uL — ABNORMAL HIGH (ref 4.0–10.5)

## 2016-09-06 LAB — BASIC METABOLIC PANEL
Anion gap: 12 (ref 5–15)
BUN: 10 mg/dL (ref 6–20)
CO2: 20 mmol/L — ABNORMAL LOW (ref 22–32)
Calcium: 8.6 mg/dL — ABNORMAL LOW (ref 8.9–10.3)
Chloride: 103 mmol/L (ref 101–111)
Creatinine, Ser: 0.9 mg/dL (ref 0.44–1.00)
GFR calc Af Amer: >60 mL/min (ref >60)
GFR calc non Af Amer: >60 mL/min (ref >60)
Glucose, Bld: 98 mg/dL (ref 65–99)
Potassium: 3.1 mmol/L — ABNORMAL LOW (ref 3.5–5.1)
Sodium: 135 mmol/L (ref 135–145)

## 2016-09-06 LAB — MAGNESIUM: Magnesium: 1.9 mg/dL (ref 1.7–2.4)

## 2016-09-06 MED ORDER — POTASSIUM CHLORIDE CRYS ER 20 MEQ PO TBCR
40.0000 meq | EXTENDED_RELEASE_TABLET | Freq: Two times a day (BID) | ORAL | Status: AC
  Administered 2016-09-06 (×2): 40 meq via ORAL
  Filled 2016-09-06 (×2): qty 2

## 2016-09-06 MED ORDER — TORSEMIDE 20 MG PO TABS
20.0000 mg | ORAL_TABLET | Freq: Every day | ORAL | Status: DC
  Administered 2016-09-06: 20 mg via ORAL
  Filled 2016-09-06 (×2): qty 1

## 2016-09-06 NOTE — Progress Notes (Signed)
Internal Medicine Attending:   I saw and examined the patient. I reviewed the resident's note and I agree with the resident's findings and plan as documented in the resident's note. Reports feeling much better today despite still febrile overnight, on exam the erythema is regressing and her leukocytosis is resolving (even while on the steroids).  BCx 1/2 G+ cocci in anaerobic bottle, but biofire did not identify any bacteria.  I suspect this may be contaminate.  Will follow.  Given she still had fever yesterday would like her to stay for at least one more day of IV antibiotics.  Possible discharge home tomorrow.

## 2016-09-06 NOTE — Progress Notes (Signed)
   Subjective:  Patient states her leg is significantly improved today in terms of tenderness and swelling. She is still having a non-productive cough and had some shortness of breath last night that was improved with breathing treatments.   Objective:  Vital signs in last 24 hours: Vitals:   09/06/16 0040 09/06/16 0231 09/06/16 0433 09/06/16 0903  BP: (!) 152/82  (!) 105/55   Pulse: 87  74   Resp: 18  18   Temp: (!) 102.1 F (38.9 C) 100 F (37.8 C) 99.3 F (37.4 C)   TempSrc: Oral Oral Oral   SpO2: 97%  95% 97%  Weight:   (!) 314 lb (142.4 kg)   Height:       Constitutional: NAD, resting comfortably CV: RRR, no murmurs, rubs or gallops  Resp: CTAB, no wheezing, no rales, no increased work of breathing Abd: soft, NDNT Ext: RLE with mild pitting edema; LLE with increased warmth, erythema though improved from yesterday, nontender today.  Assessment/Plan:  Principal Problem:   Cellulitis of left leg Active Problems:   Morbid obesity due to excess calories (HCC)   Peripheral vascular disease (HCC)   Benign essential HTN   COPD (chronic obstructive pulmonary disease) (HCC)   (HFpEF) heart failure with preserved ejection fraction (HCC)   History of pulmonary embolism   PAF (paroxysmal atrial fibrillation) (HCC)   Tobacco use   Chronic anticoagulation   Aortic atherosclerosis (HCC)   Chronic low back pain  LLE Cellulitis: Patient with significant improvement in discomfort today and some improvement in erythema. She did spike a fever overnight to 102F. Would like to continue IV abx until at least 24hr fever free. She does have a history of significant venous stasis which could be contributing to some of her discomfort - if considering UNNA boots in future, will look into ABIs; could not find documentation with prior UNNA boot application on [facility]. --continue ancef --follow fever curve --f/u AM CBC  COPD exacerbation: Patient clinically improving - no wheezing on  exam today.  --continue prednisone for full 5 day course --continue dulera, duonebs --will make sure she has maintenance inhalers at discharge - was at some point prior to 2016 on Spiriva and Advair   Hypokalemia, hypomagnesemia: K remains low at 3.1 today despite repletion yesterday. Mag 1.9. --replete K --f/u am Bmet and Mg  HTN: Stable; can restart home meds as necessary.  Chronic diastolic CHF: Weight up 3 lbs today. --restart lower dose torsemide 20mg  today --daily weights, in/outs --continue home asa 81mg  daily  A fib: Continue home Xarelto 20mg  daily and amiodarone 200mg  daily  Morbid obesity, chronically ill, deconditioning: --PT/OT consult --recommends PT/OT HH with HH aid  Dispo: Anticipated discharge in approximately 1-2 day(s).   Alphonzo Grieve, MD 09/06/2016, 10:38 AM Pager [phone]

## 2016-09-06 NOTE — Progress Notes (Signed)
Occupational Therapy Treatment Patient Details Name: Sharon Vasquez [name]: [id number] DOB: 1951-02-08 Today's Date: 09/06/2016    History of present illness Sharon Vasquez is a 66 y.o. woman with PMH morbid obesity, severe venous insufficiency (s/p L iliac vein stent 01/2016), Afib and prior DVT/PE on Xarelto, HFpEF, COPD, tobacco abuse, HTN, HLD who presents with 2 days of persistent fatigue, chills, myalgias, productive cough, chest pressure, and swelling and pain in her left leg.    OT comments  Pt provided with AE for LB ADLs and was able to perform with less effort and in more timely fashion.  Recommend HHOT.   Follow Up Recommendations  Home health OT    Equipment Recommendations  None recommended by OT    Recommendations for Other Services      Precautions / Restrictions Precautions Precautions: None       Mobility Bed Mobility                  Transfers Overall transfer level: Modified independent                    Balance Overall balance assessment: Needs assistance Sitting-balance support: Feet supported Sitting balance-Leahy Scale: Good     Standing balance support: No upper extremity supported Standing balance-Leahy Scale: Good                             ADL either performed or assessed with clinical judgement   ADL Overall ADL's : Needs assistance/impaired             Lower Body Bathing: Modified independent;With adaptive equipment;Sit to/from stand       Lower Body Dressing: Modified independent;With adaptive equipment;Sit to/from stand   Toilet Transfer: Supervision/safety   Toileting- Water quality scientist and Hygiene: Supervision/safety;Sit to/from stand       Functional mobility during ADLs: Supervision/safety;Modified independent General ADL Comments: Pt was provided with and instructed in use of AE for LB ADLs.  She was able to easily don/doff socks and retrieve items from floor.  Discussed energy  conservation techniques with her, and discussed implementation of HEP for strengthening and endurance at home      Vision       Perception     Praxis      Cognition Arousal/Alertness: Awake/alert Behavior During Therapy: WFL for tasks assessed/performed Overall Cognitive Status: Within Functional Limits for tasks assessed                                          Exercises     Shoulder Instructions       General Comments      Pertinent Vitals/ Pain       Pain Assessment: No/denies pain  Home Living                                          Prior Functioning/Environment              Frequency  Min 2X/week        Progress Toward Goals  OT Goals(current goals can now be found in the care plan section)  Progress towards OT goals: Progressing toward goals     Plan Discharge plan remains appropriate  Co-evaluation                 AM-PAC PT "6 Clicks" Daily Activity     Outcome Measure   Help from another person eating meals?: None Help from another person taking care of personal grooming?: None Help from another person toileting, which includes using toliet, bedpan, or urinal?: A Little Help from another person bathing (including washing, rinsing, drying)?: A Little Help from another person to put on and taking off regular upper body clothing?: A Little Help from another person to put on and taking off regular lower body clothing?: A Little 6 Click Score: 20    End of Session    OT Visit Diagnosis: Unsteadiness on feet (R26.81);Muscle weakness (generalized) (M62.81)   Activity Tolerance Patient tolerated treatment well   Patient Left in bed;with call bell/phone within reach   Nurse Communication Mobility status        Time: 5929-2446 OT Time Calculation (min): 28 min  Charges: OT General Charges $OT Visit: 1 Procedure OT Treatments $Self Care/Home Management : 23-37 mins  [facility],  OTR/L [phone]    Lucille Passy M 09/06/2016, 12:13 PM

## 2016-09-07 DIAGNOSIS — G8929 Other chronic pain: Secondary | ICD-10-CM

## 2016-09-07 DIAGNOSIS — M545 Low back pain: Secondary | ICD-10-CM

## 2016-09-07 DIAGNOSIS — I739 Peripheral vascular disease, unspecified: Secondary | ICD-10-CM

## 2016-09-07 LAB — BASIC METABOLIC PANEL
Anion gap: 11 (ref 5–15)
BUN: 14 mg/dL (ref 6–20)
CO2: 24 mmol/L (ref 22–32)
Calcium: 9 mg/dL (ref 8.9–10.3)
Chloride: 103 mmol/L (ref 101–111)
Creatinine, Ser: 1.04 mg/dL — ABNORMAL HIGH (ref 0.44–1.00)
GFR calc Af Amer: >60 mL/min (ref >60)
GFR calc non Af Amer: 54 mL/min — ABNORMAL LOW (ref >60)
Glucose, Bld: 99 mg/dL (ref 65–99)
Potassium: 3.5 mmol/L (ref 3.5–5.1)
Sodium: 138 mmol/L (ref 135–145)

## 2016-09-07 LAB — MAGNESIUM: Magnesium: 1.9 mg/dL (ref 1.7–2.4)

## 2016-09-07 LAB — CBC
HCT: 36.1 % (ref 36.0–46.0)
Hemoglobin: 12.2 g/dL (ref 12.0–15.0)
MCH: 28.7 pg (ref 26.0–34.0)
MCHC: 33.8 g/dL (ref 30.0–36.0)
MCV: 84.9 fL (ref 78.0–100.0)
Platelets: 298 10*3/uL (ref 150–400)
RBC: 4.25 MIL/uL (ref 3.87–5.11)
RDW: 14.2 % (ref 11.5–15.5)
WBC: 10.1 10*3/uL (ref 4.0–10.5)

## 2016-09-07 MED ORDER — FLUTICASONE-SALMETEROL 100-50 MCG/DOSE IN AEPB: 1.0000 | INHALATION_SPRAY | Freq: Two times a day (BID) | RESPIRATORY_TRACT | 11 refills | Status: DC

## 2016-09-07 MED ORDER — NICOTINE 21 MG/24HR TD PT24: 21.0000 mg | MEDICATED_PATCH | Freq: Every day | TRANSDERMAL | 0 refills | Status: DC

## 2016-09-07 MED ORDER — POTASSIUM CHLORIDE CRYS ER 20 MEQ PO TBCR: 40.0000 meq | EXTENDED_RELEASE_TABLET | Freq: Once | ORAL | Status: DC

## 2016-09-07 MED ORDER — TIOTROPIUM BROMIDE MONOHYDRATE 18 MCG IN CAPS: 18.0000 ug | ORAL_CAPSULE | Freq: Every day | RESPIRATORY_TRACT | 11 refills | Status: DC

## 2016-09-07 MED ORDER — CEPHALEXIN 500 MG PO CAPS: 500.0000 mg | ORAL_CAPSULE | Freq: Three times a day (TID) | ORAL | 0 refills | Status: AC

## 2016-09-07 MED ORDER — PREDNISONE 20 MG PO TABS: 40.0000 mg | ORAL_TABLET | Freq: Every day | ORAL | 0 refills | Status: DC

## 2016-09-07 NOTE — Discharge Summary (Signed)
Name: Sharon Vasquez [name]: [id number] DOB: 1951/01/22 66 y.o. PCP: Sharon Groves, DO  Date of Admission: 09/04/2016  3:18 PM Date of Discharge: 09/07/2016 Attending Physician: Sharon Groves, DO  Discharge Diagnosis: 1.Cellulitis 2. COPD exacerbation Principal Problem:   Cellulitis of left leg Active Problems:   Morbid obesity due to excess calories (HCC)   Peripheral vascular disease (HCC)   Benign essential HTN   COPD (chronic obstructive pulmonary disease) (HCC)   (HFpEF) heart failure with preserved ejection fraction (HCC)   History of pulmonary embolism   PAF (paroxysmal atrial fibrillation) (HCC)   Tobacco use   Chronic anticoagulation   Aortic atherosclerosis (HCC)   Chronic low back pain   Discharge Medications: Allergies as of 09/07/2016   No Known Allergies     Medication List    TAKE these medications   albuterol 108 (90 Base) MCG/ACT inhaler Commonly known as:  PROAIR HFA Inhale 2 puffs into the lungs every 6 (six) hours as needed for wheezing or shortness of breath.   albuterol (2.5 MG/3ML) 0.083% nebulizer solution Commonly known as:  PROVENTIL Take 3 mLs (2.5 mg total) by nebulization every 4 (four) hours as needed for wheezing or shortness of breath.   amiodarone 200 MG tablet Commonly known as:  PACERONE Take 1 tablet (200 mg total) by mouth daily.   amitriptyline 25 MG tablet Commonly known as:  ELAVIL Take 2 tablets (50 mg total) by mouth at bedtime.   amLODipine-benazepril 10-40 MG capsule Commonly known as:  LOTREL Take 1 capsule by mouth as needed (takes occassionally).   aspirin EC 81 MG tablet Take 81 mg by mouth daily.   cephALEXin 500 MG capsule Commonly known as:  KEFLEX Take 1 capsule (500 mg total) by mouth 3 (three) times daily.   esomeprazole 40 MG capsule Commonly known as:  NEXIUM Take 1 capsule (40 mg total) by mouth daily as needed (for heartburn or indigestion).   Fluticasone-Salmeterol 100-50 MCG/DOSE Aepb Commonly  known as:  ADVAIR DISKUS Inhale 1 puff into the lungs 2 (two) times daily.   gabapentin 400 MG capsule Commonly known as:  NEURONTIN Take 1 capsule (400 mg total) by mouth daily.   naproxen 500 MG tablet Commonly known as:  NAPROSYN Take 1 tablet (500 mg total) by mouth daily as needed for moderate pain (take with food).   nicotine 21 mg/24hr patch Commonly known as:  NICODERM CQ - dosed in mg/24 hours Place 1 patch (21 mg total) onto the skin daily. Start taking on:  09/08/2016   potassium chloride 10 MEQ tablet Commonly known as:  K-DUR,KLOR-CON Take 1 tablet (10 mEq total) by mouth 2 (two) times daily. What changed:  when to take this   predniSONE 20 MG tablet Commonly known as:  DELTASONE Take 2 tablets (40 mg total) by mouth daily with breakfast. Start taking on:  09/08/2016   rivaroxaban 20 MG Tabs tablet Commonly known as:  XARELTO Take 1 tablet (20 mg total) by mouth every morning.   tiotropium 18 MCG inhalation capsule Commonly known as:  SPIRIVA HANDIHALER Place 1 capsule (18 mcg total) into inhaler and inhale daily.   torsemide 20 MG tablet Commonly known as:  DEMADEX Take 2 tablets (40 mg total) by mouth daily.       Disposition and follow-up:   [name] was discharged from [facility] in Stable condition.  At the hospital follow up visit please address:  1.  Cellulitis: --has patient  completed course of keflex? (last dose 6/9) --has she had fevers, chills return?  PVD: --has she been wearing her compression stockings?  COPD/Asthma: --patient started on Advair and Spiriva for maintenance, albuterol PRN --prednisone 5 day course to end 6/8 --has she seen improvement in her breathing and wheezing?   DCHF: --is she taking torsemide 40mg  daily? --daily weights? --at discharge weight was 310lbs  HTN: --supposed to be taking amlodipine-benazepril daily but has taken PRN in past --is she taking it  regularly?  Hypokalemia: --potassium 59mEq daily - is she taking? --repeat Bmet, adjust K supplementation as necessary  HH needs: --PT/OT and aid ordered - have they contacted her?  2.  Labs / imaging needed at time of follow-up: Bmet for K  3.  Pending labs/ test needing follow-up: none  Follow-up Appointments: Follow-up Information    [place]. Schedule an appointment as soon as possible for a visit in 1 week(s).   Why:  Please call to make an appointment to be seen for hospital follow up in about one week. At that time, please also try to schedule an appointment with Dr. Heber [place] Vasquez for a regular checkup. Contact information: [address] [place] [facility] Course by problem list: Principal Problem:   Cellulitis of left leg Active Problems:   Morbid obesity due to excess calories (HCC)   Peripheral vascular disease (HCC)   Benign essential HTN   COPD (chronic obstructive pulmonary disease) (HCC)   (HFpEF) heart failure with preserved ejection fraction (HCC)   History of pulmonary embolism   PAF (paroxysmal atrial fibrillation) (HCC)   Tobacco use   Chronic anticoagulation   Aortic atherosclerosis (HCC)   Chronic low back pain   Left lower extremity cellulitis: Patient presented with 2 day history of worsening erythema, warmth, pain, and swelling of her LLE; when she was admitted, she was found to be febrile to 103, had a lactic acid of 3.9, did not have a leukocytosis and was otherwise hemodynamically stable. Patient was given IVF in the ED and started on broad spectrum Abx of Vanc and Zosyn. Patient does have risk factors for DVT, however, she has been compliant with her anticoagulation and her presentation and history did not concern [place] for a DVT at this time. On admission, she was transitioned to IV Ancef with continued improvement in her fever curve, pain, extent of erythema, and swelling of her  LLE. Blood cultures which were drawn at presentation to ED, had one out of four bottles positive for a gram +, coag negative cocci in an anaerobic bottle; the rest of cultures were negative x 5days. Our rapid speciation system was not able to identify the organism. It was determined that this was a false positive result and likely a contaminant. At discharge, patient was >24 without a fever, and continuing to improve. She was discharged with keflex 500mg  TID to complete full 5 day course of antibiotics. She was educated on return of symptoms and need for re-evaluation if those were to occur. She was educated on prevention of swelling with compliance with her diuretics and wearing compression stockings regularly.  COPD exacerbation: Patient presented with increasing dyspnea, productive cough, wheezing and fatigue for few days prior to admission. She has poorly controlled COPD since moving to [place] from [place], however states was at one point well controlled on Spiriva and Advair though doesn't know why these were stopped a couple of  years ago. Patient was treated with steroids, dulera, and duonebs while inpatient; she was discharged with one more day of prednisone 40mg , Advair, Spiriva, and albuterol PRN.  A fib: Patient with history of A fib with history of VTE/PE; as discussed above, low concern for DVT for this admission due to presentation, history and physical exam. Patient maintains compliance with Xarelto and amiodarone. These were continued on admission; patient remained in NSR throughout admission.  H/o diastolic CHF, HTN: Patient with echo in 07/2016 showing EF 60-65%, with G2DD. She is on torsemide 40mg  daily at home for chronic leg edema and venous insufficiency. She did receive IVF at admission due to concern for being volume down; her daily weight and in/outs were monitored daily and her torsemide restarted during hospitalization. At admission, her BP was within normal range so amlodipine-benazapril  was initially held but restarted when she became hypertensive. She usually takes this medicine PRN but was advised to take it regularly for better control of her BP.   Hypokalemia, hypomagnesemia: Patient on torsemide at home, she presented with hypokalemia and hypomagnesemia which were repleted. She was continued on her home potassium supplementation of 95mEq daily; please repeat Bmet, ensure she is taking potassium and assess need for adjusting her dosage.  Discharge Vitals:   BP (!) 155/81 (BP Location: Right Arm)   Pulse 65   Temp 97.8 F (36.6 C) (Oral)   Resp 20   Ht 6\' 2"  (1.88 m)   Wt (!) 310 lb 1.6 oz (140.7 kg)   SpO2 95%   BMI 39.81 kg/m   Pertinent Labs, Studies, and Procedures:  CBC Latest Ref Rng & Units 09/07/2016 09/06/2016 09/05/2016  WBC 4.0 - 10.5 K/uL 10.1 12.1(H) 14.5(H)  Hemoglobin 12.0 - 15.0 g/dL 12.2 11.7(L) 13.3  Hematocrit 36.0 - 46.0 % 36.1 33.6(L) 39.3  Platelets 150 - 400 K/uL 298 212 266   BMP Latest Ref Rng & Units 09/07/2016 09/06/2016 09/05/2016  Glucose 65 - 99 mg/dL 99 98 216(H)  BUN 6 - 20 mg/dL 14 10 9   Creatinine 0.44 - 1.00 mg/dL 1.04(H) 0.90 1.07(H)  Sodium 135 - 145 mmol/L 138 135 138  Potassium 3.5 - 5.1 mmol/L 3.5 3.1(L) 3.3(L)  Chloride 101 - 111 mmol/L 103 103 105  CO2 22 - 32 mmol/L 24 20(L) 22  Calcium 8.9 - 10.3 mg/dL 9.0 8.6(L) 8.3(L)   HIV screen 09/05/16: negative Blood cultures 09/04/16: 1/4 positive for coag neg, gram pos cocci - false positive/contaminant CXR 09/04/16: No active cardiopulmonary disease  Discharge Instructions: Discharge Instructions    ([place]) Call MD:  Anytime you have any of the following symptoms: 1) 3 pound weight gain in 24 hours or 5 pounds in 1 week 2) shortness of breath, with or without a dry hacking cough 3) swelling in the hands, feet or stomach 4) if you have to sleep on extra pillows at night in order to breathe.    Complete by:  As directed    Call MD for:  difficulty breathing, headache or  visual disturbances    Complete by:  As directed    Call MD for:  extreme fatigue    Complete by:  As directed    Call MD for:  hives    Complete by:  As directed    Call MD for:  persistant dizziness or light-headedness    Complete by:  As directed    Call MD for:  persistant nausea and vomiting    Complete by:  As directed    Call MD for:  redness, tenderness, or signs of infection (pain, swelling, redness, odor or green/yellow discharge around incision site)    Complete by:  As directed    Call MD for:  severe uncontrolled pain    Complete by:  As directed    Call MD for:  temperature >100.4    Complete by:  As directed    Diet - low sodium heart healthy    Complete by:  As directed    Discharge instructions    Complete by:  As directed    You were in the hospital for a leg infection and your COPD.  For your leg cellulitis: Take keflex, one pill every 8 hours starting when you get home until you run out of pills To help prevent this from happening again: -keep your legs clean; please do not scrub them with anything. Soap and water are good by themselves to clean your skin -wear compression stockings daily to help keep the fluid out of your legs and keep them from swelling and making little breaks in your skin -take your fluid pills regularly  For your COPD: Take prednisone 40mg  once tomorrow with breakfast and you will be done with the course I have prescribed Spiriva and Advair to help better control your COPD  You can also use albuterol as needed for shortness of breath  Please call the clinic to set up a hospital follow up appointment in about one week. You should also set up a separate appointment with Dr. Heber Castor for a regular follow up.   I have placed an order for home health physical and occupational therapy as well as a home health aid. You should be hearing from them to set up a time for them to come by in the next couple of days.   Increase activity slowly     Complete by:  As directed       Signed: Alphonzo Grieve, MD 09/07/2016, 12:16 PM   Pager [phone]

## 2016-09-07 NOTE — Progress Notes (Signed)
Internal Medicine Attending:   I saw and examined the patient. I reviewed the resident's note and I agree with the resident's findings and plan as documented in the resident's note. She is afebrile overnight, lower extremity erythema greatly improved, she does have some chronic changes associated with her chronic venous insufficiency. We will discharge her today with oral Keflex.

## 2016-09-07 NOTE — Progress Notes (Addendum)
Patient educated and well aware that her output needs to be measured and documented, however pt stated that she "went twice to the bathroom and voided". Patient educated again about importance accurate intake and output. Otherwise, patient with no complaints or concerns during 7pm - 7am shift.  Perrin Eddleman, RN

## 2016-09-07 NOTE — Progress Notes (Signed)
Transitions of Care Pharmacy Note  Plan:  - Educated on discharge antibiotic and prednisone. Instructed patient that both of these prescriptions should be taken with food due to potential for stomach upset.  - Confirmed patient has ride to pharmacy if discharged in early afternoon. --------------------------------------------- Sharon Vasquez is an 66 y.o. female who presents with a chief complaint of cellulitus. In anticipation of discharge, pharmacy has reviewed this patient's prior to admission medication history, as well as current inpatient medications listed per the [facility].  Current medication indications, dosing, frequency, and notable side effects reviewed with patient. patient verbalized understanding of current inpatient medication regimen and is aware that the After Visit Summary when presented, will represent the most accurate medication list at discharge.      Assessment: Understanding of regimen: fair Understanding of indications: fair Potential of compliance: good Barriers to Obtaining Medications: No- Potential transportation issues but states that she has a ride today. Usually uses a scooter for transportation.   Patient instructed to contact inpatient pharmacy team with further questions or concerns if needed.    Time spent preparing for discharge counseling: 10 minutes  Time spent counseling patient: 10 minutes    Thank you for allowing pharmacy to be a part of this patient's care.  Ihor Austin, PharmD PGY1 Pharmacy Resident Pager: [phone]

## 2016-09-07 NOTE — Progress Notes (Signed)
   Subjective:  Patient states her leg is significantly improved even from yesterday. She denies further fevers or chills, has no chest pain or shortness of breath. She is eager to go home today.   Objective:  Vital signs in last 24 hours: Vitals:   09/06/16 2106 09/07/16 0512 09/07/16 0624 09/07/16 0847  BP: 139/80 (!) 172/87 (!) 155/81   Pulse: 72 62 65   Resp: (!) 72 20    Temp: 99.2 F (37.3 C) 97.8 F (36.6 C)    TempSrc: Oral Oral    SpO2: 98% 97% 97% 95%  Weight:  (!) 310 lb 1.6 oz (140.7 kg)    Height:       Constitutional: NAD, resting comfortably CV: RRR, no murmurs, rubs or gallops  Resp: CTAB, no wheezing, no rales, no increased work of breathing Abd: soft, NDNT Ext: RLE with 1+ pitting edema; LLE with mild increased warmth, erythema though improved from yesterday, nontender.  Assessment/Plan:  Principal Problem:   Cellulitis of left leg Active Problems:   Morbid obesity due to excess calories (HCC)   Peripheral vascular disease (HCC)   Benign essential HTN   COPD (chronic obstructive pulmonary disease) (HCC)   (HFpEF) heart failure with preserved ejection fraction (HCC)   History of pulmonary embolism   PAF (paroxysmal atrial fibrillation) (HCC)   Tobacco use   Chronic anticoagulation   Aortic atherosclerosis (HCC)   Chronic low back pain  LLE Cellulitis: Patient with continued improvement in her c --continue ancef - switch to keflex on discharge --encouraged compression stockings daily for management of her venous insufficiency to help prevent further cellulitis episodes  COPD exacerbation: Patient clinically improved..  --continue prednisone for full 5 day course --continue dulera, duonebs --will make sure she has maintenance inhalers at discharge - was at some point prior to 2016 on Spiriva and Advair   Hypokalemia, hypomagnesemia: Resolved. Bmet at f/u appt.  HTN: Increasing BP today; will restart home meds at discharge.  Chronic diastolic  CHF: Weight down 4lbs since restarting home torsemide --restart home dose of torsemide at discharge --daily weights, in/outs --continue home asa 81mg  daily  A fib: Continue home Xarelto 20mg  daily and amiodarone 200mg  daily  Morbid obesity, chronically ill, deconditioning: --PT/OT consult --recommends PT/OT HH with HH aid  Dispo: Anticipated discharge today.   Alphonzo Grieve, MD 09/07/2016, 11:41 AM Pager [phone]

## 2016-09-08 LAB — CULTURE, BLOOD (ROUTINE X 2)

## 2016-09-09 LAB — CULTURE, BLOOD (ROUTINE X 2): Culture: NO GROWTH

## 2016-09-14 ENCOUNTER — Ambulatory Visit (INDEPENDENT_AMBULATORY_CARE_PROVIDER_SITE_OTHER): Payer: Medicare Other | Admitting: Internal Medicine

## 2016-09-14 VITALS — BP 109/75 | HR 81 | Temp 97.7°F | Ht 74.0 in | Wt 309.3 lb

## 2016-09-14 DIAGNOSIS — J449 Chronic obstructive pulmonary disease, unspecified: Secondary | ICD-10-CM | POA: Diagnosis not present

## 2016-09-14 DIAGNOSIS — F17210 Nicotine dependence, cigarettes, uncomplicated: Secondary | ICD-10-CM

## 2016-09-14 DIAGNOSIS — L03116 Cellulitis of left lower limb: Secondary | ICD-10-CM

## 2016-09-14 DIAGNOSIS — I5030 Unspecified diastolic (congestive) heart failure: Secondary | ICD-10-CM

## 2016-09-14 DIAGNOSIS — I480 Paroxysmal atrial fibrillation: Secondary | ICD-10-CM

## 2016-09-14 DIAGNOSIS — Z7901 Long term (current) use of anticoagulants: Secondary | ICD-10-CM

## 2016-09-14 DIAGNOSIS — Z79899 Other long term (current) drug therapy: Secondary | ICD-10-CM

## 2016-09-14 DIAGNOSIS — I10 Essential (primary) hypertension: Secondary | ICD-10-CM

## 2016-09-14 DIAGNOSIS — Z5189 Encounter for other specified aftercare: Secondary | ICD-10-CM | POA: Diagnosis not present

## 2016-09-14 MED ORDER — POTASSIUM CHLORIDE CRYS ER 10 MEQ PO TBCR: 10.0000 meq | EXTENDED_RELEASE_TABLET | Freq: Two times a day (BID) | ORAL | 2 refills | Status: DC

## 2016-09-14 MED ORDER — TORSEMIDE 20 MG PO TABS: 20.0000 mg | ORAL_TABLET | Freq: Every day | ORAL | 5 refills | Status: DC

## 2016-09-14 NOTE — Assessment & Plan Note (Signed)
BP Readings from Last 3 Encounters:  09/14/16 109/75  09/07/16 (!) 155/81  08/24/16 120/80   Her blood pressure was well controlled. She never told this morning torsemide or any of her blood pressure medication.  According to patient she checked her blood pressure daily and takes her blood pressure medication on the if systolic is above 225 or diastolic is above 90. She also stated that if she takes her blood pressure medications regularly along with other medicines at a normal blood pressure, she becomes very dizzy and has to stay in bed the whole day. She takes torsemide 40 mg daily, amiodarone 200 mg daily, Lotrel 10-40 her blood pressure, she takes Lotrel as when necessary. She is compliant with all of her other medications.  -I decreased the dose of torsemide to 20 mg daily after discussing with Dr. Darnell Level. if her blood pressure remained softer, we can consider decreasing the dose of her Lotrel.

## 2016-09-14 NOTE — Assessment & Plan Note (Signed)
She was admitted in hospital from June 4.  Till 09/07/2016 with left lower extremity cellulitis and COPD exacerbation. She became afebrile improvement in her respiratory status and was discharged home on Keflex for 3 more days and prednisone for 1 more day. According to patient she did completed all of her antibiotics.  She is feeling much better, although still complaining of left ankle discomfort, she was also worried about the thickening of skin at her left lower extremity. She just feels low in energy. She denies any more fever or chills. Her lower extremity swelling is improving.  On exam she do have darkening and thickening of her skin on her both lower extremities more pronounced on the left. Most likely due to venous stasis along with left lower extremity cellulitis.  Patient was encouraged to use compression stockings regularly and keep her feet elevated.

## 2016-09-14 NOTE — Progress Notes (Signed)
Medicine attending: Medical history, presenting problems, physical findings, and medications, reviewed with resident physician Dr Lorella Nimrod on the day of the patient visit and I concur with her evaluation and management plan. Post hospital follow up for cellulitis and exaecrbation of COPD. Note: EPIC note says grade 2 diastolic dysfunction. A recent 4/18 echocardiogram does not mention diastolic dysfunction.

## 2016-09-14 NOTE — Assessment & Plan Note (Signed)
She just completed her course of prednisone. She denied any more cough or shortness of breath.  She was placed on Advair and Spiriva during her current hospitalization, stating that combination of Advair and Spiriva works very well for her.  Her chest was clear on exam today.  Continue current management with Advair and Spiriva along with albuterol when necessary.

## 2016-09-14 NOTE — Progress Notes (Signed)
   CC: Hospital follow-up.  HPI:  [name] is a 66 y.o. past medical history as listed below came to the clinic for her hospital follow-up.   She was admitted in hospital from June 4.  Till 09/07/2016 with left lower extremity cellulitis and COPD exacerbation. She became afebrile improvement in her respiratory status and was discharged home on Keflex for 3 more days and prednisone for 1 more day. According to patient she did completed all of her antibiotics and prednisone.  She is feeling much better, although still complaining of left ankle discomfort, she was also worried about the thickening of skin at her left lower extremity. She just feels low in energy. She denies any more fever or chills. Her lower extremity swelling is improving.  She denies any chest pain or shortness of breath, stating that combination of Advair and Spiriva works very well for her.  According to patient she checked her blood pressure daily and takes her blood pressure medication on the if systolic is above 797 or diastolic is above 90. She also stated that if she takes her blood pressure medications regularly along with other medicines at a normal blood pressure, she becomes very dizzy and has to stay in bed the whole day. She takes torsemide 40 mg daily, amiodarone 200 mg daily, Lotrel 10-40 her blood pressure, she takes Lotrel as when necessary. She is compliant with all of her other medications.  She still smokes, trying to quit, has decreased her quantity from 1-1/2 pack per day to 1/4 pack per day.   Past Medical History:  Diagnosis Date  . A-fib ([place])   . CHF (congestive heart failure) ([place])   . COPD (chronic obstructive pulmonary disease) ([place])   . Hyperlipidemia   . Hypertension   . Morbid obesity ([place])   . PE (pulmonary embolism)     Review of Systems:  AS per HPI.  Physical Exam:  Vitals:   09/14/16 0848  BP: 109/75  Pulse: 81  Temp: 97.7 F (36.5 C)  TempSrc: Oral  SpO2: 95%    Weight: (!) 309 lb 4.8 oz (140.3 kg)  Height: 6\' 2"  (1.88 m)    General: Vital signs reviewed.  Patient is well-developed and well-nourished, in no acute distress and cooperative with exam.  Cardiovascular: RRR, S1 normal, S2 normal, no murmurs, gallops, or rubs. Pulmonary/Chest: Clear to auscultation bilaterally, no wheezes, rales, or rhonchi. Abdominal: Soft, non-tender, non-distended, BS +, no masses, organomegaly, or guarding present.  Extremities: Mild left lower extremities with edema with thickening and darkening of skin. Pulses intact and symmetrical. Skin: Warm, dry and intact. Bilateral lower extremity darkening of skin with more pronounced on left lower extremity along with left lower extremity thickening of skin. Psychiatric: Normal mood and affect. speech and behavior is normal. Cognition and memory are normal.  Assessment & Plan:   See Encounters Tab for problem based charting.  Patient discussed with Dr. Beryle Beams.

## 2016-09-14 NOTE — Assessment & Plan Note (Signed)
Currently in sinus rhythm.  She is compliant with her amiodarone and  Xarelto.  Continue current management.

## 2016-09-14 NOTE — Patient Instructions (Addendum)
Thank you for visiting clinic today. I am decreasing the dose of torsemide from 40 to 20, if your blood pressure continue to remain low, we will decrease the dose of your blood pressure medicine. Please continue taking your medications as directed. We will check your blood for electrolytes today, we will call you with any abnormal results. Please follow-up in 4 weeks with your PCP.

## 2016-09-15 LAB — BMP8+ANION GAP
Anion Gap: 21 mmol/L — ABNORMAL HIGH (ref 10.0–18.0)
BUN/Creatinine Ratio: 20 (ref 12–28)
BUN: 17 mg/dL (ref 8–27)
CO2: 22 mmol/L (ref 20–29)
Calcium: 9.4 mg/dL (ref 8.7–10.3)
Chloride: 100 mmol/L (ref 96–106)
Creatinine, Ser: 0.87 mg/dL (ref 0.57–1.00)
GFR calc Af Amer: 80 mL/min/{1.73_m2} (ref >59)
GFR calc non Af Amer: 69 mL/min/{1.73_m2} (ref >59)
Glucose: 109 mg/dL — ABNORMAL HIGH (ref 65–99)
Potassium: 3.5 mmol/L (ref 3.5–5.2)
Sodium: 143 mmol/L (ref 134–144)

## 2016-09-20 ENCOUNTER — Telehealth: Payer: Self-pay

## 2016-09-20 NOTE — Progress Notes (Signed)
Patient was contacted with Casey Wells, PharmD candidate. I agree with the assessment and plan of care documented. 

## 2016-09-20 NOTE — Telephone Encounter (Signed)
Sharon Vasquez is a 66 y.o. female who was contacted for follow up on COPD medication management.   COPD medications:  - albuterol inhaler (ProAir HFA) 108 MCG & nebulizer (Proventil) 0.083% - fluticasone-salmeterol (Advair Diskus) 100-50 MCG - tiotropium (Spiriva Handihaler) 18 MCG  Pt reports nonadherence with current COPD medications. Pt reports taking Advair 1 puff once daily, prescribed 1 puff twice daily. Pt was instructed to start using Advair twice daily for her COPD. Pt is adherent with Spiriva Handihaler. Pt reports requiring Proventil every other day before she goes outside or goes walking. Pt prefers the nebulizer over the inhaler form of albuterol. Pt reports slight SOB but has improved with medications since hospital discharge earlier this month. Pt denies side effects from COPD medications. Pt denies trouble getting medications refilled. Pt denies difficulty affording or picking up medications as her family helps pick up her medications. Pt was interested in enrolling to get SCAD. Pt denies quitting smoking and is no longer using her nicotine patches. She admits that the patches helped when she was using them and is not out of patches but did not give a reason for stopping use.   Advised patient to contact clinic if concerns or symptoms arise. Patient verbalized understanding.  Maryan Char, PharmD Candidate

## 2016-09-21 ENCOUNTER — Other Ambulatory Visit: Payer: Self-pay | Admitting: *Deleted

## 2016-09-21 MED ORDER — AMITRIPTYLINE HCL 25 MG PO TABS: 50.0000 mg | ORAL_TABLET | Freq: Every day | ORAL | 0 refills | Status: DC

## 2016-09-21 NOTE — Telephone Encounter (Signed)
Called patient reports taking only PRN.  Will try to discuss in more detail at next visit, otherwise she reports she is doing well.

## 2016-10-02 NOTE — Telephone Encounter (Signed)
Patient was contacted with Sharon Vasquez, PharmD candidate. I agree with the assessment and plan of care documented.  Patient was also reviewed for [place] therapy and has no concerns at this time, refills are consistent per pharmacy records.

## 2016-10-19 ENCOUNTER — Telehealth: Payer: Self-pay

## 2016-10-19 NOTE — Telephone Encounter (Signed)
Sharon Vasquez is a 66 y.o. female who was contacted for follow up on COPD medication management.   COPD medications:  - albuterol inhaler (ProAir HFA) 108 MCG & nebulizer (Proventil) 0.083% - fluticasone-salmeterol (Advair Diskus) 100-50 MCG - tiotropium (Spiriva Handihaler) 18 MCG  Pt reports nonadherence as she ran out of Spiriva. Pt has refills left at [facility], she knows that she needs to go pick up Spiriva. Pt reports that she takes Spiriva once daily when she has it. Pt reports taking Advair twice daily. Pt reports requiring rescue inhaler once every other day, but she states that when she has both her Advair and Spiriva she rarely needs albuterol. Pt reports significant increase in SOB since her Torsemide dose was decreased at last office visit. Pt reports that she has a lot of fluid building up in her legs and that she is gaining weight which is making her breathing more labored. Pt reports that yesterday she has switched back to her prior Torsemide dose because she thinks that this may help with the swelling. Pt was advised to come in to [facility] if breathing or fluid retention worsens. Pt denies side effects from COPD medications. Pt denies any trouble getting medications refilled. Pt denies difficulty affording or picking up medications, she recently received SCAT and [facility] is starting a delivery program. Pt denies quitting smoking but the amount of cigarettes is down to 4 per day as her breathing is so labored from fluid retention.  Advised patient to contact clinic if concerns or symptoms arise. Patient verbalized understanding.  Maryan Char, PharmD Candidate

## 2016-10-21 NOTE — Telephone Encounter (Signed)
Patient was contacted with Maryan Char, PharmD candidate. I agree with the assessment and plan of care documented. Patient was encouraged to schedule in [facility].

## 2016-11-07 NOTE — Progress Notes (Signed)
[place] [facility] Subjective:  HPI: [name] is a 66 y.o. female who presents for follow up of HTN, COPD, venous insufficency  Please see Assessment and Plan below for the status of her chronic medical problems.  Review of Systems: Positive leg swelling Negative: SOB, chest pain, palpatations, fever Objective:  Physical Exam: Vitals:   11/09/16 0903  BP: 125/84  Pulse: 70  Temp: 97.9 F (36.6 C)  TempSrc: Oral  SpO2: 94%  Weight: (!) 318 lb 14.4 oz (144.7 kg)  Height: 6\' 2"  (1.88 m)   Physical Exam  Constitutional: She is well-developed, well-nourished, and in no distress.  Eyes: Conjunctivae are normal.  Cardiovascular: Normal heart sounds.   Pulmonary/Chest: Effort normal and breath sounds normal. She has no wheezes. She has no rales.  Abdominal: Soft. Bowel sounds are normal.  Musculoskeletal: She exhibits edema (2+ pedal edema to knee bilaterally, chronic changes of skin of lower extremitites bilaterally).  Nursing note and vitals reviewed.   Assessment & Plan:  Peripheral vascular disease ([place]) HPI: she was previously seening vascular surgery when she was in [place].  She has had stending of left common and external iliac vein for her history of chronic venous insufficency.  Most recently she was seen in our clinic for HFU and instructed to decrease her toursemide to once a day due to some hypotension.  This has caused her leg swelling to worsen.  She has compressing stockings that go up to her thigh however does not wear them until she sees "water blisters" develop because they "cut off circulation"  A: Peripheral vascular disease, venous insufficency  P: Increase toursemide back to BID, D/C amlodipine benazepril combo, if needed will restart ACE but I would like to avoid amlodipine in her. Discussed obtaining ABI today, she has to go for transportation, will get at follow up in 3 weeks Discussed using compression stockings before legs get so  bad.  Benign essential HTN HPI: She has not been taking her perscribed Amlodipine -benazepril combination pill much lately.  She notes that when she takes it she feels dizzy on standing.  [name] has been taking it PRN for high blood pressure.  A: Essential HTN  P; Controlled today without taking in last week. I will have her D/C the combo pill for now, if needed would restart ace I however would like to avoid amlodipine given her severe venous insufficency.  COPD (chronic obstructive pulmonary disease) (HCC) HPI: Has been taking her inhalers no current issues. No SOB. Still smoking 4 cig a day  A:COPD  P: Continue spirvia and advair   Morbid obesity due to excess calories ([place]) Discussed importance of weight loss, discussed calorie counting  Tobacco use HPI: Smoking 4 cig a day, reports has decreased from 2ppd  A: Tobacco abuse  P:  Discussed importance of smoking cessation completely in relation to her COPD and multiple other medication problems.  Spent a dedicated 3 minutes to cessation counseling.   Medications Ordered Meds ordered this encounter  Medications  . DISCONTD: torsemide (DEMADEX) 20 MG tablet    Sig: Take 1 tablet (20 mg total) by mouth 2 (two) times daily.    Dispense:  60 tablet    Refill:  5  . senna-docusate (SENOKOT-S) 8.6-50 MG tablet    Sig: Take 1 tablet by mouth 2 (two) times daily.    Dispense:  60 tablet    Refill:  11  . torsemide (DEMADEX) 20 MG tablet    Sig: Take  1 tablet (20 mg total) by mouth 2 (two) times daily.    Dispense:  60 tablet    Refill:  5   Other Orders Orders Placed This Encounter  Procedures  . POCT ABI Screening for Pilot No Charge    This Order is for screening for the ABI Pilot.  It is a no charge exam.    Follow Up: Return in about 3 weeks (around 11/30/2016).

## 2016-11-09 ENCOUNTER — Ambulatory Visit (INDEPENDENT_AMBULATORY_CARE_PROVIDER_SITE_OTHER): Payer: Medicare Other | Admitting: Internal Medicine

## 2016-11-09 ENCOUNTER — Encounter: Payer: Self-pay | Admitting: Internal Medicine

## 2016-11-09 VITALS — BP 125/84 | HR 70 | Temp 97.9°F | Ht 74.0 in | Wt 318.9 lb

## 2016-11-09 DIAGNOSIS — Z8349 Family history of other endocrine, nutritional and metabolic diseases: Secondary | ICD-10-CM

## 2016-11-09 DIAGNOSIS — F17210 Nicotine dependence, cigarettes, uncomplicated: Secondary | ICD-10-CM

## 2016-11-09 DIAGNOSIS — I10 Essential (primary) hypertension: Secondary | ICD-10-CM | POA: Diagnosis not present

## 2016-11-09 DIAGNOSIS — Z79899 Other long term (current) drug therapy: Secondary | ICD-10-CM

## 2016-11-09 DIAGNOSIS — Z720 Tobacco use: Secondary | ICD-10-CM

## 2016-11-09 DIAGNOSIS — I739 Peripheral vascular disease, unspecified: Secondary | ICD-10-CM | POA: Diagnosis not present

## 2016-11-09 DIAGNOSIS — Z7951 Long term (current) use of inhaled steroids: Secondary | ICD-10-CM

## 2016-11-09 DIAGNOSIS — Z8249 Family history of ischemic heart disease and other diseases of the circulatory system: Secondary | ICD-10-CM

## 2016-11-09 DIAGNOSIS — Z6841 Body Mass Index (BMI) 40.0 and over, adult: Secondary | ICD-10-CM

## 2016-11-09 DIAGNOSIS — J449 Chronic obstructive pulmonary disease, unspecified: Secondary | ICD-10-CM | POA: Diagnosis not present

## 2016-11-09 MED ORDER — TORSEMIDE 20 MG PO TABS: 20.0000 mg | ORAL_TABLET | Freq: Two times a day (BID) | ORAL | 5 refills | Status: DC

## 2016-11-09 MED ORDER — SENNOSIDES-DOCUSATE SODIUM 8.6-50 MG PO TABS: 1.0000 | ORAL_TABLET | Freq: Two times a day (BID) | ORAL | 11 refills | Status: DC

## 2016-11-09 NOTE — Patient Instructions (Signed)
I want you to stop taking the amlodipine-benazepril for now, please call if you blood pressure goes up before I see you back in 3 weeks.  I want you to increase your torsemide to twice a day.

## 2016-11-10 NOTE — Assessment & Plan Note (Signed)
HPI: Smoking 4 cig a day, reports has decreased from 2ppd  A: Tobacco abuse  P:  Discussed importance of smoking cessation completely in relation to her COPD and multiple other medication problems.  Spent a dedicated 3 minutes to cessation counseling.

## 2016-11-10 NOTE — Assessment & Plan Note (Signed)
HPI: She has not been taking her perscribed Amlodipine -benazepril combination pill much lately.  She notes that when she takes it she feels dizzy on standing.  Sharon Vasquez has been taking it PRN for high blood pressure.  A: Essential HTN  P; Controlled today without taking in last week. I will have her D/C the combo pill for now, if needed would restart ace I however would like to avoid amlodipine given her severe venous insufficency.

## 2016-11-10 NOTE — Assessment & Plan Note (Signed)
HPI: she was previously seening vascular surgery when she was in [place].  She has had stending of left common and external iliac vein for her history of chronic venous insufficency.  Most recently she was seen in our clinic for HFU and instructed to decrease her toursemide to once a day due to some hypotension.  This has caused her leg swelling to worsen.  She has compressing stockings that go up to her thigh however does not wear them until she sees "water blisters" develop because they "cut off circulation"  A: Peripheral vascular disease, venous insufficency  P: Increase toursemide back to BID, D/C amlodipine benazepril combo, if needed will restart ACE but I would like to avoid amlodipine in her. Discussed obtaining ABI today, she has to go for transportation, will get at follow up in 3 weeks Discussed using compression stockings before legs get so bad.

## 2016-11-10 NOTE — Assessment & Plan Note (Signed)
Discussed importance of weight loss, discussed calorie counting

## 2016-11-10 NOTE — Assessment & Plan Note (Addendum)
HPI: Has been taking her inhalers no current issues. No SOB. Still smoking 4 cig a day  A:COPD  P: Continue spirvia and advair

## 2016-11-14 ENCOUNTER — Other Ambulatory Visit: Payer: Self-pay

## 2016-11-14 NOTE — Patient Outreach (Signed)
[facility]) Care Management  11/14/2016  LAPORCHE MARTELLE October 21, 1950 [id number]   Medication Adherence call to Mrs. Fayola Meckes the reason for this call is because she is showing past due under [employer].on her amlodipine/benazepril 10/40mg   Spoke to Mrs. Borromeo and said she is on scale on this medication doctor Festus Barren  told her to take less because sometimes her blood pressure goes down and sometimes is up Mrs. Eisel said she does not need any medication at this time.    [place] Management Direct Dial [phone]  Fax [phone] [email or web address]

## 2016-11-16 DIAGNOSIS — I82409 Acute embolism and thrombosis of unspecified deep veins of unspecified lower extremity: Secondary | ICD-10-CM | POA: Insufficient documentation

## 2016-11-16 HISTORY — DX: Acute embolism and thrombosis of unspecified deep veins of unspecified lower extremity: I82.409

## 2016-11-21 ENCOUNTER — Other Ambulatory Visit: Payer: Self-pay

## 2016-11-21 NOTE — Patient Outreach (Signed)
[facility]) Care Management  11/21/2016  Sharon Vasquez Aug 03, 1950 [id number]  TELEPHONE SCREENING Referral date: 11/16/16 Referral source: pharmacy medication adherence / patient engagement referral Referral reason: engagement tool score: 13 Insurance: [place] health care  Telephone call received from patient. HPAA verified. Discussed pharmacy patient engagement with patient. Discussed and offered [facility] care management services with patient. Patient states while she had home health services the nurse educated her on her health conditions. Patient states she does not have home health services at this time. Patient states she has also worked in the [employer] for many years. Patient states she is managing her care at this time. Patient states she has assistance with her medications and has [employer]AT set up for transportation. Patient verbally agreed to received [facility] care management brochure for future reference.   PLAN:  RNCM will refer patient to care management assistant to close due to refusal of services.  RNCM will notify patients primary MD of closure RNCM will send patient outreach letter and brochure.   Quinn Plowman RN,BSN,CCM [facility] Telephonic  [phone]

## 2016-11-21 NOTE — Patient Outreach (Signed)
[facility]) Care Management  11/21/2016  NATOSHIA SOUTER 09-Jun-1950 [id number]  TELEPHONE SCREENING Referral date: 11/16/16 Referral source: pharmacy medication adherence / patient engagement referral Referral reason: engagement tool score: 13 Insurance: [employer] health care Attempt #1  Telephone call to patient regarding pharmacy patient engagement referral. Unable to reach patient. HIPAA compliant voice message left with call back phone number.   PLAN: RNCM will attempt 2nd telephone call to patient within 1 week.   Quinn Plowman RN,BSN,CCM [facility] Telephonic  [phone]

## 2016-11-22 ENCOUNTER — Ambulatory Visit: Payer: Self-pay

## 2016-11-28 ENCOUNTER — Other Ambulatory Visit: Payer: Self-pay | Admitting: *Deleted

## 2016-11-28 MED ORDER — ESOMEPRAZOLE MAGNESIUM 40 MG PO CPDR: 40.0000 mg | DELAYED_RELEASE_CAPSULE | Freq: Every day | ORAL | 2 refills | Status: DC | PRN

## 2016-11-29 NOTE — Progress Notes (Signed)
[place] [facility] Subjective:  HPI: Ms.Nijah D Vasquez is a 66 y.o. female who presents for f/u HTN  Please see Assessment and Plan below for the status of her chronic medical problems.  Review of Systems: No dizziness, fever, chest pain + chronic LE swelling Objective:  Physical Exam: Vitals:   11/30/16 0854  BP: (!) 153/75  Pulse: (!) 56  Temp: 97.8 F (36.6 C)  TempSrc: Oral  SpO2: 95%  Weight: (!) 330 lb 8 oz (149.9 kg)  Height: 6\' 2"  (1.88 m)  Physical Exam  Constitutional: No distress.  Cardiovascular: Normal rate and regular rhythm.   Pulmonary/Chest: Effort normal and breath sounds normal. She has no wheezes. She has no rales.  Abdominal: Soft. Bowel sounds are normal.  Musculoskeletal: She exhibits edema (3+ bilateral to knees).  Skin:  Chronic venous statsis dermatitis of b/l LE  Nursing note and vitals reviewed.   Assessment & Plan:  Benign essential HTN HPI: She has been off her combination BP pill since we last saw each other.  A: Essential HTN, uncontrolled  P: Will start he back on benazepril alone at 20mg  daily Avoid amlodipine for now given chronic venous stasis.  Peripheral vascular disease ([place]) HPI: LE edema improved slightly per her report.  A: Peripheral vascular disease, chronic venous stasis  P: Obtain ABI- wnl Compression stockings  Aortic atherosclerosis (HCC) Check lipid panel  Hepatitis C antibody positive in blood Hep C screening test ordered. Ab is positive, will reflex to HCV RNA.   Medications Ordered Meds ordered this encounter  Medications  . benazepril (LOTENSIN) 20 MG tablet    Sig: Take 1 tablet (20 mg total) by mouth daily.    Dispense:  90 tablet    Refill:  3  . DOK 100 MG capsule    Sig: TK 1 C PO BID PRN FOR MODERATE CONSTIPATION    Refill:  0   Other Orders Orders Placed This Encounter  Procedures  . Lipid Profile  . Hepatitis C antibody  . HCV RNA quant   Follow Up: Return in  about 2 months (around 01/30/2017).

## 2016-11-30 ENCOUNTER — Ambulatory Visit (INDEPENDENT_AMBULATORY_CARE_PROVIDER_SITE_OTHER): Payer: Medicare Other | Admitting: Internal Medicine

## 2016-11-30 ENCOUNTER — Other Ambulatory Visit: Payer: Self-pay | Admitting: Internal Medicine

## 2016-11-30 ENCOUNTER — Encounter: Payer: Self-pay | Admitting: Internal Medicine

## 2016-11-30 VITALS — BP 153/75 | HR 56 | Temp 97.8°F | Ht 74.0 in | Wt 330.5 lb

## 2016-11-30 DIAGNOSIS — Z9189 Other specified personal risk factors, not elsewhere classified: Secondary | ICD-10-CM

## 2016-11-30 DIAGNOSIS — Z1159 Encounter for screening for other viral diseases: Secondary | ICD-10-CM | POA: Diagnosis not present

## 2016-11-30 DIAGNOSIS — I10 Essential (primary) hypertension: Secondary | ICD-10-CM | POA: Diagnosis not present

## 2016-11-30 DIAGNOSIS — I739 Peripheral vascular disease, unspecified: Secondary | ICD-10-CM

## 2016-11-30 DIAGNOSIS — Z1239 Encounter for other screening for malignant neoplasm of breast: Secondary | ICD-10-CM

## 2016-11-30 DIAGNOSIS — I872 Venous insufficiency (chronic) (peripheral): Secondary | ICD-10-CM

## 2016-11-30 DIAGNOSIS — Z780 Asymptomatic menopausal state: Secondary | ICD-10-CM

## 2016-11-30 DIAGNOSIS — Z1231 Encounter for screening mammogram for malignant neoplasm of breast: Secondary | ICD-10-CM

## 2016-11-30 DIAGNOSIS — R768 Other specified abnormal immunological findings in serum: Secondary | ICD-10-CM | POA: Diagnosis not present

## 2016-11-30 DIAGNOSIS — I700 Atherosclerosis of aorta: Secondary | ICD-10-CM

## 2016-11-30 DIAGNOSIS — F17210 Nicotine dependence, cigarettes, uncomplicated: Secondary | ICD-10-CM

## 2016-11-30 DIAGNOSIS — E2839 Other primary ovarian failure: Secondary | ICD-10-CM

## 2016-11-30 MED ORDER — BENAZEPRIL HCL 20 MG PO TABS: 20.0000 mg | ORAL_TABLET | Freq: Every day | ORAL | 3 refills | Status: DC

## 2016-11-30 NOTE — Patient Instructions (Signed)
I want you to start back taking Benazepril 20mg  each day.

## 2016-11-30 NOTE — Progress Notes (Signed)
ROI form signed by pt - faxed to [facility] (Fax#- [phone]).

## 2016-12-01 DIAGNOSIS — R768 Other specified abnormal immunological findings in serum: Secondary | ICD-10-CM | POA: Insufficient documentation

## 2016-12-01 LAB — LIPID PANEL
Chol/HDL Ratio: 4.2 ratio (ref 0.0–4.4)
Cholesterol, Total: 176 mg/dL (ref 100–199)
HDL: 42 mg/dL (ref >39)
LDL Calculated: 119 mg/dL — ABNORMAL HIGH (ref 0–99)
Triglycerides: 75 mg/dL (ref 0–149)
VLDL Cholesterol Cal: 15 mg/dL (ref 5–40)

## 2016-12-01 LAB — HEPATITIS C ANTIBODY: Hep C Virus Ab: 8.9 s/co ratio — ABNORMAL HIGH (ref 0.0–0.9)

## 2016-12-01 NOTE — Assessment & Plan Note (Signed)
Check lipid panel  

## 2016-12-01 NOTE — Assessment & Plan Note (Signed)
HPI: She has been off her combination BP pill since we last saw each other.  A: Essential HTN, uncontrolled  P: Will start he back on benazepril alone at 20mg  daily Avoid amlodipine for now given chronic venous stasis.

## 2016-12-01 NOTE — Assessment & Plan Note (Signed)
HPI: LE edema improved slightly per her report.  A: Peripheral vascular disease, chronic venous stasis  P: Obtain ABI- wnl Compression stockings

## 2016-12-01 NOTE — Assessment & Plan Note (Signed)
Hep C screening test ordered. Ab is positive, will reflex to HCV RNA.

## 2016-12-03 LAB — HCV RNA QUANT: Hepatitis C Quantitation: NOT DETECTED IU/mL

## 2016-12-03 LAB — SPECIMEN STATUS REPORT

## 2016-12-14 ENCOUNTER — Ambulatory Visit
Admission: RE | Admit: 2016-12-14 | Discharge: 2016-12-14 | Disposition: A | Discharge status: Home / Self Care | Payer: Medicare Other | Source: Ambulatory Visit | Attending: Internal Medicine | Admitting: Internal Medicine

## 2016-12-14 ENCOUNTER — Other Ambulatory Visit: Payer: Self-pay | Admitting: Internal Medicine

## 2016-12-14 ENCOUNTER — Encounter: Payer: Self-pay | Admitting: Internal Medicine

## 2016-12-14 DIAGNOSIS — M810 Age-related osteoporosis without current pathological fracture: Secondary | ICD-10-CM | POA: Insufficient documentation

## 2016-12-14 DIAGNOSIS — Z1231 Encounter for screening mammogram for malignant neoplasm of breast: Secondary | ICD-10-CM | POA: Diagnosis not present

## 2016-12-14 DIAGNOSIS — E2839 Other primary ovarian failure: Secondary | ICD-10-CM

## 2016-12-14 DIAGNOSIS — N6320 Unspecified lump in the left breast, unspecified quadrant: Secondary | ICD-10-CM

## 2016-12-14 DIAGNOSIS — Z780 Asymptomatic menopausal state: Secondary | ICD-10-CM | POA: Diagnosis not present

## 2016-12-14 NOTE — Progress Notes (Signed)
DEXA revealed Osteoporosis, will check vitamin D. Mammography revealed left breast mass, needs diagnostic mammogram and U/S orders placed.

## 2016-12-15 ENCOUNTER — Other Ambulatory Visit: Payer: Self-pay | Admitting: Internal Medicine

## 2016-12-15 DIAGNOSIS — R928 Other abnormal and inconclusive findings on diagnostic imaging of breast: Secondary | ICD-10-CM

## 2016-12-20 ENCOUNTER — Other Ambulatory Visit: Payer: Medicare Other

## 2016-12-21 ENCOUNTER — Inpatient Hospital Stay (HOSPITAL_COMMUNITY)
Admission: EM | Admit: 2016-12-21 | Discharge: 2016-12-26 | DRG: 872 | Disposition: A | Discharge status: Home / Self Care | Payer: Medicare Other | Attending: Internal Medicine | Admitting: Internal Medicine

## 2016-12-21 ENCOUNTER — Encounter (HOSPITAL_COMMUNITY): Payer: Self-pay | Admitting: Emergency Medicine

## 2016-12-21 ENCOUNTER — Emergency Department (HOSPITAL_COMMUNITY): Payer: Medicare Other

## 2016-12-21 ENCOUNTER — Other Ambulatory Visit: Payer: Self-pay

## 2016-12-21 ENCOUNTER — Other Ambulatory Visit: Payer: Self-pay | Admitting: Internal Medicine

## 2016-12-21 DIAGNOSIS — L03116 Cellulitis of left lower limb: Secondary | ICD-10-CM | POA: Diagnosis present

## 2016-12-21 DIAGNOSIS — N179 Acute kidney failure, unspecified: Secondary | ICD-10-CM | POA: Diagnosis not present

## 2016-12-21 DIAGNOSIS — I5032 Chronic diastolic (congestive) heart failure: Secondary | ICD-10-CM | POA: Diagnosis not present

## 2016-12-21 DIAGNOSIS — I4891 Unspecified atrial fibrillation: Secondary | ICD-10-CM | POA: Diagnosis present

## 2016-12-21 DIAGNOSIS — Z7951 Long term (current) use of inhaled steroids: Secondary | ICD-10-CM

## 2016-12-21 DIAGNOSIS — I480 Paroxysmal atrial fibrillation: Secondary | ICD-10-CM | POA: Diagnosis present

## 2016-12-21 DIAGNOSIS — R0603 Acute respiratory distress: Secondary | ICD-10-CM | POA: Diagnosis present

## 2016-12-21 DIAGNOSIS — I878 Other specified disorders of veins: Secondary | ICD-10-CM | POA: Diagnosis not present

## 2016-12-21 DIAGNOSIS — E875 Hyperkalemia: Secondary | ICD-10-CM | POA: Diagnosis present

## 2016-12-21 DIAGNOSIS — I5030 Unspecified diastolic (congestive) heart failure: Secondary | ICD-10-CM | POA: Diagnosis present

## 2016-12-21 DIAGNOSIS — I739 Peripheral vascular disease, unspecified: Secondary | ICD-10-CM | POA: Diagnosis present

## 2016-12-21 DIAGNOSIS — A419 Sepsis, unspecified organism: Secondary | ICD-10-CM | POA: Diagnosis not present

## 2016-12-21 DIAGNOSIS — E876 Hypokalemia: Secondary | ICD-10-CM | POA: Diagnosis present

## 2016-12-21 DIAGNOSIS — Z79899 Other long term (current) drug therapy: Secondary | ICD-10-CM | POA: Diagnosis not present

## 2016-12-21 DIAGNOSIS — Z7982 Long term (current) use of aspirin: Secondary | ICD-10-CM | POA: Diagnosis not present

## 2016-12-21 DIAGNOSIS — Z86711 Personal history of pulmonary embolism: Secondary | ICD-10-CM

## 2016-12-21 DIAGNOSIS — F17210 Nicotine dependence, cigarettes, uncomplicated: Secondary | ICD-10-CM | POA: Diagnosis present

## 2016-12-21 DIAGNOSIS — I5022 Chronic systolic (congestive) heart failure: Secondary | ICD-10-CM | POA: Diagnosis not present

## 2016-12-21 DIAGNOSIS — R0602 Shortness of breath: Secondary | ICD-10-CM | POA: Diagnosis not present

## 2016-12-21 DIAGNOSIS — R079 Chest pain, unspecified: Secondary | ICD-10-CM | POA: Diagnosis not present

## 2016-12-21 DIAGNOSIS — J449 Chronic obstructive pulmonary disease, unspecified: Secondary | ICD-10-CM | POA: Diagnosis present

## 2016-12-21 DIAGNOSIS — J441 Chronic obstructive pulmonary disease with (acute) exacerbation: Secondary | ICD-10-CM | POA: Diagnosis present

## 2016-12-21 DIAGNOSIS — Z7901 Long term (current) use of anticoagulants: Secondary | ICD-10-CM | POA: Diagnosis not present

## 2016-12-21 DIAGNOSIS — I10 Essential (primary) hypertension: Secondary | ICD-10-CM | POA: Diagnosis present

## 2016-12-21 DIAGNOSIS — I110 Hypertensive heart disease with heart failure: Secondary | ICD-10-CM | POA: Diagnosis not present

## 2016-12-21 DIAGNOSIS — Z90710 Acquired absence of both cervix and uterus: Secondary | ICD-10-CM

## 2016-12-21 DIAGNOSIS — R069 Unspecified abnormalities of breathing: Secondary | ICD-10-CM | POA: Diagnosis not present

## 2016-12-21 DIAGNOSIS — I5020 Unspecified systolic (congestive) heart failure: Secondary | ICD-10-CM | POA: Diagnosis present

## 2016-12-21 DIAGNOSIS — I5031 Acute diastolic (congestive) heart failure: Secondary | ICD-10-CM | POA: Diagnosis present

## 2016-12-21 DIAGNOSIS — Z6841 Body Mass Index (BMI) 40.0 and over, adult: Secondary | ICD-10-CM

## 2016-12-21 DIAGNOSIS — R05 Cough: Secondary | ICD-10-CM | POA: Diagnosis not present

## 2016-12-21 LAB — CBC WITH DIFFERENTIAL/PLATELET
Basophils Absolute: 0 10*3/uL (ref 0.0–0.1)
Basophils Relative: 0 %
Eosinophils Absolute: 0.3 10*3/uL (ref 0.0–0.7)
Eosinophils Relative: 2 %
HCT: 39.4 % (ref 36.0–46.0)
Hemoglobin: 13.4 g/dL (ref 12.0–15.0)
Lymphocytes Relative: 15 %
Lymphs Abs: 2.3 10*3/uL (ref 0.7–4.0)
MCH: 29.4 pg (ref 26.0–34.0)
MCHC: 34 g/dL (ref 30.0–36.0)
MCV: 86.4 fL (ref 78.0–100.0)
Monocytes Absolute: 0.8 10*3/uL (ref 0.1–1.0)
Monocytes Relative: 5 %
Neutro Abs: 12.1 10*3/uL — ABNORMAL HIGH (ref 1.7–7.7)
Neutrophils Relative %: 78 %
Platelets: 351 10*3/uL (ref 150–400)
RBC: 4.56 MIL/uL (ref 3.87–5.11)
RDW: 15.5 % (ref 11.5–15.5)
WBC: 15.6 10*3/uL — ABNORMAL HIGH (ref 4.0–10.5)

## 2016-12-21 LAB — COMPREHENSIVE METABOLIC PANEL
ALT: 18 U/L (ref 14–54)
AST: 19 U/L (ref 15–41)
Albumin: 3.5 g/dL (ref 3.5–5.0)
Alkaline Phosphatase: 80 U/L (ref 38–126)
Anion gap: 5 (ref 5–15)
BUN: 18 mg/dL (ref 6–20)
CO2: 24 mmol/L (ref 22–32)
Calcium: 8.8 mg/dL — ABNORMAL LOW (ref 8.9–10.3)
Chloride: 106 mmol/L (ref 101–111)
Creatinine, Ser: 1.37 mg/dL — ABNORMAL HIGH (ref 0.44–1.00)
GFR calc Af Amer: 45 mL/min — ABNORMAL LOW (ref >60)
GFR calc non Af Amer: 39 mL/min — ABNORMAL LOW (ref >60)
Glucose, Bld: 147 mg/dL — ABNORMAL HIGH (ref 65–99)
Potassium: 3.7 mmol/L (ref 3.5–5.1)
Sodium: 135 mmol/L (ref 135–145)
Total Bilirubin: 1.4 mg/dL — ABNORMAL HIGH (ref 0.3–1.2)
Total Protein: 7.6 g/dL (ref 6.5–8.1)

## 2016-12-21 LAB — PROTIME-INR
INR: 2.02
Prothrombin Time: 22.7 seconds — ABNORMAL HIGH (ref 11.4–15.2)

## 2016-12-21 LAB — I-STAT CG4 LACTIC ACID, ED: Lactic Acid, Venous: 1.53 mmol/L (ref 0.5–1.9)

## 2016-12-21 MED ORDER — RIVAROXABAN 20 MG PO TABS: 20.0000 mg | ORAL_TABLET | ORAL | 5 refills | Status: DC

## 2016-12-21 MED ORDER — VANCOMYCIN HCL 10 G IV SOLR
2500.0000 mg | Freq: Once | INTRAVENOUS | Status: AC
  Administered 2016-12-21: 2500 mg via INTRAVENOUS
  Filled 2016-12-21: qty 2500

## 2016-12-21 MED ORDER — FUROSEMIDE 10 MG/ML IJ SOLN
40.0000 mg | Freq: Once | INTRAMUSCULAR | Status: DC
  Filled 2016-12-21: qty 4

## 2016-12-21 MED ORDER — ACETAMINOPHEN 500 MG PO TABS
1000.0000 mg | ORAL_TABLET | Freq: Once | ORAL | Status: AC
  Administered 2016-12-21: 1000 mg via ORAL
  Filled 2016-12-21: qty 2

## 2016-12-21 MED ORDER — PIPERACILLIN-TAZOBACTAM 3.375 G IVPB 30 MIN
3.3750 g | Freq: Once | INTRAVENOUS | Status: AC
  Administered 2016-12-21: 3.375 g via INTRAVENOUS
  Filled 2016-12-21: qty 50

## 2016-12-21 MED ORDER — SODIUM CHLORIDE 0.9 % IV BOLUS (SEPSIS)
250.0000 mL | Freq: Once | INTRAVENOUS | Status: AC
  Administered 2016-12-21: 250 mL via INTRAVENOUS

## 2016-12-21 NOTE — ED Triage Notes (Signed)
Pt called EMS because she was progressively short of breath of the last week.  She is A&O x4 with a labored RR of 25-30 she received and albuterol and Duoneb treatment via ems and is currently on 5L of O2 with a SPO2 of 96

## 2016-12-21 NOTE — ED Provider Notes (Signed)
Medical screening examination/treatment/procedure(s) were conducted as a shared visit with non-physician practitioner(s) and myself.  I personally evaluated the patient during the encounter.   EKG Interpretation None     Patient ports she has been getting increasingly short of breath for about a week but much worse for past couple days. Positive cough with generalized weakness and chills. Patient notes chest pain today. She has had increased swelling in her legs. She has developed redness in the left lower extremity. She reports she has had problems with infection previously. Patient is alert and nontoxic. She does not have significant respiratory distress at rest. Morbid obesity. Heart regular. No gross rub murmur gallop. Lungs with diffuse wheeze. Patient has large swelling of both lower extremities. This appears in some degree to be very chronic. However she also has bright erythema of the left lower extremity up to the knee. Patient does have fever and leukocytosis with tachycardia. Likely source is pneumonia and cellulitis. Sepsis protocol initiated. I agree with plan of management.   Charlesetta Shanks, MD 12/21/16 2311

## 2016-12-21 NOTE — Telephone Encounter (Signed)
NEEDS REFILL ON XARELTO  20 MG. TO Sharon Vasquez [phone]

## 2016-12-21 NOTE — ED Provider Notes (Signed)
[facility] DEPT Provider Note   CSN: [id number] Arrival date & time: 12/21/16  2143     History   Chief Complaint Chief Complaint  Patient presents with  . Shortness of Breath    HPI Sharon Vasquez is a 66 y.o. female.  HPI 66 year old African-American female past medical history is significant for hypertension, morbid obesity, CHF, COPD, A. fib currently on anticoagulation, history of PE, severe venous insufficiency status post left iliac vein stent 01/2016, tobacco use that presents to the emergency department today with multiple complaints. Patient endorses increasing shortness of breath over the past week that has acutely worsened over the past 2 days. Patient reports a productive cough for the past week. She reports chills but denies any fevers at home. Patient complains of substernal chest pain that does not radiate. The chest pain is nonexertional. Not pleuritic in nature. However patient does state that she was diagnosed with a PE several years ago and this feels very similar. Denies any associated nausea, emesis, diaphoresis. Patient also complains of generalized weakness and fatigue. She reports worsening swelling in her lower extremities which is worse in her left with associated redness and warmth. Patient was recently treated for cellulitis and 09/2016 where she required hospitalization at the left lower extremity. Patient reports some mild nausea but denies any emesis. She denies any focal abdominal pain, urinary symptoms, vaginal symptoms. Patient states that she is taking her blood thinners as prescribed and has not missed a dose. In route by EMS she was given a DuoNeb due to her wheezing. She was put on 5 L of oxygen satting at 96%. Tachypnea noted with EMS.   Pt denies any fever, chill, ha, vision changes, lightheadedness, dizziness, congestion, neck pain, abd pain, n/v/d, urinary symptoms, change in bowel habits, melena, hematochezia, lower extremity  paresthesias.     Past Medical History:  Diagnosis Date  . A-fib ([place])   . CHF (congestive heart failure) ([place])   . COPD (chronic obstructive pulmonary disease) ([place])   . Hyperlipidemia   . Hypertension   . Morbid obesity ([place])   . PE (pulmonary embolism)     Patient Active Problem List   Diagnosis Date Noted  . Osteoporosis 12/14/2016  . Hepatitis C antibody positive in blood 12/01/2016  . DVT (deep venous thrombosis) ([place]) 11/16/2016  . Cellulitis of left leg 09/04/2016  . Chronic low back pain 08/21/2016  . Drug induced constipation 08/21/2016  . Elevated blood sugar 07/19/2016  . Abdominal pain 07/19/2016  . Aortic atherosclerosis ([place]) 07/17/2016  . Chronic venous stasis dermatitis of both lower extremities 07/12/2016  . Chronic anticoagulation 07/12/2016  . Tobacco use 07/11/2016  . COPD (chronic obstructive pulmonary disease) ([place])   . (HFpEF) heart failure with preserved ejection fraction ([place])   . History of pulmonary embolism   . PAF (paroxysmal atrial fibrillation) ([place])   . Vocal cord polyp 12/18/2015  . Laryngopharyngeal reflux 12/18/2015  . Dysphonia 12/16/2015  . Morbid obesity due to excess calories ([place])   . Peripheral vascular disease ([place])   . Benign essential HTN   . Pulmonary embolism ([place]) 09/21/2014  . Atrial fibrillation with RVR ([place]) 09/21/2014  . Primary osteoarthritis of both knees 09/16/2014    Past Surgical History:  Procedure Laterality Date  . ABDOMINAL HYSTERECTOMY    . APPENDECTOMY    . BREAST CYST EXCISION    . BREAST LUMPECTOMY Left   . CHOLECYSTECTOMY    . TONSILLECTOMY      OB History  No data available       Home Medications    Prior to Admission medications   Medication Sig Start Date End Date Taking? Authorizing Provider  albuterol (PROAIR HFA) 108 (90 Base) MCG/ACT inhaler Inhale 2 puffs into the lungs every 6 (six) hours as needed for wheezing or shortness of breath. 08/14/16  Yes Lucious Groves, DO   albuterol (PROVENTIL) (2.5 MG/3ML) 0.083% nebulizer solution Take 3 mLs (2.5 mg total) by nebulization every 4 (four) hours as needed for wheezing or shortness of breath. 08/14/16 12/21/16 Yes Lucious Groves, DO  amiodarone (PACERONE) 200 MG tablet Take 1 tablet (200 mg total) by mouth daily. 07/26/16  Yes Almyra Deforest, PA  aspirin EC 81 MG tablet Take 81 mg by mouth daily.   Yes [provider]  benazepril (LOTENSIN) 20 MG tablet Take 1 tablet (20 mg total) by mouth daily. 11/30/16 11/30/17 Yes Hoffman, Erik C, DO  DOK 100 MG capsule TK 1 C PO BID PRN FOR MODERATE CONSTIPATION 09/23/16  Yes [provider]  esomeprazole (NEXIUM) 40 MG capsule Take 1 capsule (40 mg total) by mouth daily as needed (for heartburn or indigestion). 11/28/16  Yes Lucious Groves, DO  Fluticasone-Salmeterol (ADVAIR DISKUS) 100-50 MCG/DOSE AEPB Inhale 1 puff into the lungs 2 (two) times daily. 09/07/16 09/07/17 Yes Alphonzo Grieve, MD  gabapentin (NEURONTIN) 400 MG capsule Take 1 capsule (400 mg total) by mouth daily. Patient taking differently: Take 400 mg by mouth 3 (three) times daily.  08/14/16  Yes Lucious Groves, DO  naproxen (NAPROSYN) 500 MG tablet Take 1 tablet (500 mg total) by mouth daily as needed for moderate pain (take with food). 08/21/16  Yes Oval Linsey, MD  potassium chloride (K-DUR,KLOR-CON) 10 MEQ tablet Take 1 tablet (10 mEq total) by mouth 2 (two) times daily. 09/14/16  Yes Lorella Nimrod, MD  rivaroxaban (XARELTO) 20 MG TABS tablet Take 1 tablet (20 mg total) by mouth every morning. 12/21/16  Yes Lucious Groves, DO  tiotropium (SPIRIVA HANDIHALER) 18 MCG inhalation capsule Place 1 capsule (18 mcg total) into inhaler and inhale daily. 09/07/16  Yes Alphonzo Grieve, MD  torsemide (DEMADEX) 20 MG tablet Take 1 tablet (20 mg total) by mouth 2 (two) times daily. 11/09/16  Yes Lucious Groves, DO  nicotine (NICODERM CQ - DOSED IN MG/24 HOURS) 21 mg/24hr patch Place 1 patch (21 mg total) onto the skin  daily. Patient not taking: Reported on 11/09/2016 09/08/16   Alphonzo Grieve, MD  senna-docusate (SENOKOT-S) 8.6-50 MG tablet Take 1 tablet by mouth 2 (two) times daily. Patient not taking: Reported on 12/21/2016 11/09/16   Lucious Groves, DO    Family History Family History  Problem Relation Age of Onset  . Breast cancer Mother   . Hypertension Mother   . Hyperlipidemia Mother   . Hypertension Maternal Grandfather   . Hyperlipidemia Maternal Grandfather     Social History Social History  Substance Use Topics  . Smoking status: Current Some Day Smoker    Packs/day: 0.50    Years: 54.00    Types: Cigarettes    Start date: 08/16/1961  . Smokeless tobacco: Never Used     Comment: smoking 4-5 cigarettes/ day  . Alcohol use No     Allergies   Patient has no known allergies.   Review of Systems Review of Systems  Constitutional: Negative for chills and fever.  HENT: Negative for congestion.   Eyes: Negative for visual disturbance.  Respiratory: Positive  for cough, shortness of breath and wheezing.   Cardiovascular: Positive for chest pain.  Gastrointestinal: Negative for abdominal pain, diarrhea, nausea and vomiting.  Genitourinary: Negative for dysuria, flank pain, frequency, hematuria, urgency, vaginal bleeding and vaginal discharge.  Musculoskeletal: Negative for arthralgias and myalgias.  Skin: Positive for color change. Negative for rash.  Neurological: Positive for weakness. Negative for dizziness, syncope, light-headedness, numbness and headaches.  Psychiatric/Behavioral: Negative for sleep disturbance. The patient is not nervous/anxious.      Physical Exam Updated Vital Signs BP (!) 105/54   Pulse (!) 105   Temp (!) 102.5 F (39.2 C) (Oral)   Resp (!) 29   Ht 6\' 2"  (1.88 m)   Wt (!) 140.2 kg (309 lb)   SpO2 93%   BMI 39.67 kg/m   Physical Exam  Constitutional: She is oriented to person, place, and time. She appears well-developed and well-nourished.   Non-toxic appearance. No distress.  HENT:  Head: Normocephalic and atraumatic.  Nose: Nose normal.  Mouth/Throat: Oropharynx is clear and moist.  Eyes: Pupils are equal, round, and reactive to light. Conjunctivae are normal. Right eye exhibits no discharge. Left eye exhibits no discharge.  Neck: Normal range of motion. Neck supple.  Cardiovascular: Normal rate, regular rhythm, normal heart sounds and intact distal pulses.   Pulmonary/Chest: Effort normal. No accessory muscle usage. Tachypnea noted. No respiratory distress. She has decreased breath sounds. She has wheezes. She exhibits no tenderness.  Patient satting at 96% on 5 L of oxygen. Bibasilar crackles noted.  Abdominal: Soft. Bowel sounds are normal. There is no tenderness. There is no rebound and no guarding.  Musculoskeletal: Normal range of motion. She exhibits no tenderness.  Lymphadenopathy:    She has no cervical adenopathy.  Neurological: She is alert and oriented to person, place, and time.  Skin: Skin is warm and dry. Capillary refill takes less than 2 seconds.  Bilateral lower extremity edema. Chronic venous stasis likely. Patient also has significant erythema and edema to the left lower extremity from the foot to the mid leg. Warm to touch. Tender to palpation. Radial pulses are 2+ bilaterally. Sensation intact. Cap refill normal.  Psychiatric: Her behavior is normal. Judgment and thought content normal.  Nursing note and vitals reviewed.    ED Treatments / Results  Labs (all labs ordered are listed, but only abnormal results are displayed) Labs Reviewed  COMPREHENSIVE METABOLIC PANEL - Abnormal; Notable for the following:       Result Value   Glucose, Bld 147 (*)    Creatinine, Ser 1.37 (*)    Calcium 8.8 (*)    Total Bilirubin 1.4 (*)    GFR calc non Af Amer 39 (*)    GFR calc Af Amer 45 (*)    All other components within normal limits  CBC WITH DIFFERENTIAL/PLATELET - Abnormal; Notable for the following:     WBC 15.6 (*)    Neutro Abs 12.1 (*)    All other components within normal limits  PROTIME-INR - Abnormal; Notable for the following:    Prothrombin Time 22.7 (*)    All other components within normal limits  CULTURE, BLOOD (ROUTINE X 2)  CULTURE, BLOOD (ROUTINE X 2)  URINALYSIS, ROUTINE W REFLEX MICROSCOPIC  TROPONIN I  BRAIN NATRIURETIC PEPTIDE  I-STAT CG4 LACTIC ACID, ED    EKG  EKG Interpretation None       Radiology Dg Chest Portable 1 View  Result Date: 12/21/2016 CLINICAL DATA:  Chest pain, cough and congestion,  shortness of breath for 3 days. EXAM: PORTABLE CHEST 1 VIEW COMPARISON:  Chest x-ray dated 09/04/2016. Chest x-ray dated 12/09/2015. FINDINGS: Cardiomegaly is stable. Overall cardiomediastinal silhouette is stable, given the slightly oblique/lordotic patient positioning. Central pulmonary vascular congestion and mild bilateral interstitial edema. No confluent opacity to suggest a developing pneumonia. No pleural effusion or pneumothorax seen. No acute or suspicious osseous finding. IMPRESSION: Cardiomegaly with mild CHF/volume overload. No evidence of pneumonia. Electronically Signed   By: Franki Cabot M.D.   On: 12/21/2016 23:23    Procedures Procedures (including critical care time)  Medications Ordered in ED Medications  vancomycin (VANCOCIN) 2,500 mg in sodium chloride 0.9 % 500 mL IVPB (not administered)  piperacillin-tazobactam (ZOSYN) IVPB 3.375 g (not administered)  furosemide (LASIX) injection 40 mg (not administered)  acetaminophen (TYLENOL) tablet 1,000 mg (1,000 mg Oral Given 12/21/16 2303)  sodium chloride 0.9 % bolus 250 mL (250 mLs Intravenous New Bag/Given 12/21/16 2319)     Initial Impression / Assessment and Plan / ED Course  I have reviewed the triage vital signs and the nursing notes.  Pertinent labs & imaging results that were available during my care of the patient were reviewed by me and considered in my medical decision making (see chart  for details).     Patient presents to the emergency department today with complaints of increasing shortness of breath, left lower extremity edema, redness, warmth, fever, chills. Patient with history of cellulitis in the left leg that has required admission in the past.  denies any associated diarrhea or urinary symptoms.   Patient is alert and nontoxic. She does appear to be uncomfortable. Patient is febrile 102.5. Heart rate of 107. Pressures are normotensive. She has mild tachypnea noted. Satting at a 95% on room air.  Lungs with diffuse crackles and wheezing bilaterally. Abdominal exam is benign without any focal tenderness. She does have significant erythema, warmth, edema to the left lower extremity. This appears to be patient's source of infection however cannot rule out pneumonia.  Patient is febrile 102.7 and tachycardic at 107. Patient with leukocytosis of 15,000. Given these findings patient was started on sepsis protocol. Lactic acid is normal. Will get the 30 mL per KG fluid boluses patient blood pressures are reassuring. Patient with history of congestive heart failure. Mild elevation in patient's creatinine of 1.3. Baseline appears to be 0.9. UA is pending however patient does not have any urinary symptoms. BNP and troponin are pending.   Chest x-ray reveals CHF with mild volume overload. No focal infiltrate. Patient with history of PE however takes her anticoagulations as prescribed and has not missed a dose. Patient states this feels the last time she had a PE. Will order CT PE study. This is pending at this time. Low suspicion for PE. Can rule out underlying pneumonia.  I suspect the patient's fever and source of infection is likely her left lower leg cellulitis. History of same.   Patient was started on broad-spectrum antibiotics. Blood cultures were obtained prior to metabolic initiation. Patient's vital signs remained reassuring. Her fever and tachycardia has improved. Pressures  remained stable.   Patient with mild fluid overload. Do not want to load patient with IV fluids.  Discussed patient with internal medicine teaching service who agrees for admission will come to the ED to evaluate patient. Patient up-to-date on plan of care. Patient is requesting something to eat however she states that her belly hurts but this is because she is hungry.   Pt seen and  eval by Dr. Johnney Killian who is agreeable to the above plan.  CRITICAL CARE Performed by: Ocie Cornfield   Total critical care time: 30 minutes  Critical care time was exclusive of separately billable procedures and treating other patients.  Sepsis likely cellulitis  Critical care was necessary to treat or prevent imminent or life-threatening deterioration.  Critical care was time spent personally by me on the following activities: development of treatment plan with patient and/or surrogate as well as nursing, discussions with consultants, evaluation of patient's response to treatment, examination of patient, obtaining history from patient or surrogate, ordering and performing treatments and interventions, ordering and review of laboratory studies, ordering and review of radiographic studies, pulse oximetry and re-evaluation of patient's condition.     Final Clinical Impressions(s) / ED Diagnoses   Final diagnoses:  Sepsis, due to unspecified organism ([place])  Cellulitis of left lower extremity  SOB (shortness of breath)    New Prescriptions New Prescriptions   No medications on file     Aaron Edelman 12/22/16 [place], [place], MD 12/22/16 [phone]

## 2016-12-21 NOTE — ED Notes (Signed)
Vancomycin and Zosyn IV antibiotics infusing , NS IV bolus 250 ml infusing , IV sites intact , respirations unlabored . Denies pain .

## 2016-12-22 ENCOUNTER — Other Ambulatory Visit: Payer: Self-pay

## 2016-12-22 ENCOUNTER — Emergency Department (HOSPITAL_COMMUNITY): Payer: Medicare Other

## 2016-12-22 DIAGNOSIS — M79609 Pain in unspecified limb: Secondary | ICD-10-CM | POA: Diagnosis not present

## 2016-12-22 DIAGNOSIS — Z7901 Long term (current) use of anticoagulants: Secondary | ICD-10-CM

## 2016-12-22 DIAGNOSIS — M7989 Other specified soft tissue disorders: Secondary | ICD-10-CM | POA: Diagnosis not present

## 2016-12-22 DIAGNOSIS — A419 Sepsis, unspecified organism: Secondary | ICD-10-CM | POA: Diagnosis present

## 2016-12-22 DIAGNOSIS — I110 Hypertensive heart disease with heart failure: Secondary | ICD-10-CM

## 2016-12-22 DIAGNOSIS — E875 Hyperkalemia: Secondary | ICD-10-CM | POA: Diagnosis present

## 2016-12-22 DIAGNOSIS — I480 Paroxysmal atrial fibrillation: Secondary | ICD-10-CM | POA: Diagnosis present

## 2016-12-22 DIAGNOSIS — Z86711 Personal history of pulmonary embolism: Secondary | ICD-10-CM | POA: Diagnosis not present

## 2016-12-22 DIAGNOSIS — Z79899 Other long term (current) drug therapy: Secondary | ICD-10-CM | POA: Diagnosis not present

## 2016-12-22 DIAGNOSIS — L03116 Cellulitis of left lower limb: Secondary | ICD-10-CM | POA: Diagnosis present

## 2016-12-22 DIAGNOSIS — I5022 Chronic systolic (congestive) heart failure: Secondary | ICD-10-CM | POA: Diagnosis not present

## 2016-12-22 DIAGNOSIS — Z6841 Body Mass Index (BMI) 40.0 and over, adult: Secondary | ICD-10-CM | POA: Diagnosis not present

## 2016-12-22 DIAGNOSIS — E876 Hypokalemia: Secondary | ICD-10-CM | POA: Diagnosis present

## 2016-12-22 DIAGNOSIS — I739 Peripheral vascular disease, unspecified: Secondary | ICD-10-CM | POA: Diagnosis present

## 2016-12-22 DIAGNOSIS — I10 Essential (primary) hypertension: Secondary | ICD-10-CM | POA: Diagnosis present

## 2016-12-22 DIAGNOSIS — I5032 Chronic diastolic (congestive) heart failure: Secondary | ICD-10-CM | POA: Diagnosis present

## 2016-12-22 DIAGNOSIS — J449 Chronic obstructive pulmonary disease, unspecified: Secondary | ICD-10-CM

## 2016-12-22 DIAGNOSIS — I878 Other specified disorders of veins: Secondary | ICD-10-CM | POA: Diagnosis present

## 2016-12-22 DIAGNOSIS — Z7951 Long term (current) use of inhaled steroids: Secondary | ICD-10-CM | POA: Diagnosis not present

## 2016-12-22 DIAGNOSIS — Z7982 Long term (current) use of aspirin: Secondary | ICD-10-CM | POA: Diagnosis not present

## 2016-12-22 DIAGNOSIS — Z90710 Acquired absence of both cervix and uterus: Secondary | ICD-10-CM | POA: Diagnosis not present

## 2016-12-22 DIAGNOSIS — R0603 Acute respiratory distress: Secondary | ICD-10-CM | POA: Diagnosis present

## 2016-12-22 DIAGNOSIS — N179 Acute kidney failure, unspecified: Secondary | ICD-10-CM | POA: Diagnosis present

## 2016-12-22 DIAGNOSIS — F17210 Nicotine dependence, cigarettes, uncomplicated: Secondary | ICD-10-CM | POA: Diagnosis present

## 2016-12-22 DIAGNOSIS — R0602 Shortness of breath: Secondary | ICD-10-CM | POA: Diagnosis present

## 2016-12-22 LAB — MRSA PCR SCREENING: MRSA by PCR: NEGATIVE

## 2016-12-22 LAB — URINALYSIS, ROUTINE W REFLEX MICROSCOPIC
Bacteria, UA: NONE SEEN
Bilirubin Urine: NEGATIVE
Glucose, UA: NEGATIVE mg/dL
Hgb urine dipstick: NEGATIVE
Ketones, ur: NEGATIVE mg/dL
Leukocytes, UA: NEGATIVE
Nitrite: NEGATIVE
Protein, ur: 30 mg/dL — AB
Specific Gravity, Urine: 1.021 (ref 1.005–1.030)
pH: 5 (ref 5.0–8.0)

## 2016-12-22 LAB — I-STAT TROPONIN, ED: Troponin i, poc: 0 ng/mL (ref 0.00–0.08)

## 2016-12-22 LAB — TROPONIN I
Troponin I: 0.08 ng/mL (ref <0.03)
Troponin I: 0.06 ng/mL (ref <0.03)
Troponin I: 0.06 ng/mL (ref <0.03)

## 2016-12-22 LAB — BASIC METABOLIC PANEL
Anion gap: 8 (ref 5–15)
BUN: 19 mg/dL (ref 6–20)
CO2: 26 mmol/L (ref 22–32)
Calcium: 8.4 mg/dL — ABNORMAL LOW (ref 8.9–10.3)
Chloride: 104 mmol/L (ref 101–111)
Creatinine, Ser: 1.53 mg/dL — ABNORMAL HIGH (ref 0.44–1.00)
GFR calc Af Amer: 39 mL/min — ABNORMAL LOW (ref >60)
GFR calc non Af Amer: 34 mL/min — ABNORMAL LOW (ref >60)
Glucose, Bld: 152 mg/dL — ABNORMAL HIGH (ref 65–99)
Potassium: 3.1 mmol/L — ABNORMAL LOW (ref 3.5–5.1)
Sodium: 138 mmol/L (ref 135–145)

## 2016-12-22 LAB — BRAIN NATRIURETIC PEPTIDE: B Natriuretic Peptide: 154.1 pg/mL — ABNORMAL HIGH (ref 0.0–100.0)

## 2016-12-22 LAB — I-STAT CG4 LACTIC ACID, ED: Lactic Acid, Venous: 0.96 mmol/L (ref 0.5–1.9)

## 2016-12-22 MED ORDER — POTASSIUM CHLORIDE CRYS ER 20 MEQ PO TBCR
40.0000 meq | EXTENDED_RELEASE_TABLET | Freq: Once | ORAL | Status: AC
  Administered 2016-12-22: 40 meq via ORAL
  Filled 2016-12-22: qty 2

## 2016-12-22 MED ORDER — SODIUM CHLORIDE 0.9 % IV SOLN
INTRAVENOUS | Status: AC
  Administered 2016-12-22: 09:00:00 via INTRAVENOUS

## 2016-12-22 MED ORDER — IPRATROPIUM-ALBUTEROL 0.5-2.5 (3) MG/3ML IN SOLN
3.0000 mL | Freq: Four times a day (QID) | RESPIRATORY_TRACT | Status: DC
  Administered 2016-12-22 – 2016-12-23 (×4): 3 mL via RESPIRATORY_TRACT
  Filled 2016-12-22 (×6): qty 3

## 2016-12-22 MED ORDER — VANCOMYCIN HCL IN DEXTROSE 1-5 GM/200ML-% IV SOLN
1000.0000 mg | Freq: Once | INTRAVENOUS | Status: AC
  Administered 2016-12-22: 1000 mg via INTRAVENOUS
  Filled 2016-12-22: qty 200

## 2016-12-22 MED ORDER — IOPAMIDOL (ISOVUE-370) INJECTION 76%
INTRAVENOUS | Status: AC
  Administered 2016-12-22: 100 mL
  Filled 2016-12-22: qty 100

## 2016-12-22 MED ORDER — MOMETASONE FURO-FORMOTEROL FUM 100-5 MCG/ACT IN AERO
2.0000 | INHALATION_SPRAY | Freq: Two times a day (BID) | RESPIRATORY_TRACT | Status: DC
  Administered 2016-12-22 – 2016-12-26 (×8): 2 via RESPIRATORY_TRACT
  Filled 2016-12-22: qty 8.8

## 2016-12-22 MED ORDER — AMIODARONE HCL 200 MG PO TABS
200.0000 mg | ORAL_TABLET | Freq: Every day | ORAL | Status: DC
  Administered 2016-12-22 – 2016-12-26 (×5): 200 mg via ORAL
  Filled 2016-12-22 (×6): qty 1

## 2016-12-22 MED ORDER — ACETAMINOPHEN 650 MG RE SUPP: 650.0000 mg | Freq: Four times a day (QID) | RECTAL | Status: DC | PRN

## 2016-12-22 MED ORDER — TIOTROPIUM BROMIDE MONOHYDRATE 18 MCG IN CAPS
18.0000 ug | ORAL_CAPSULE | Freq: Every day | RESPIRATORY_TRACT | Status: DC
  Filled 2016-12-22: qty 5

## 2016-12-22 MED ORDER — SODIUM CHLORIDE 0.9 % IV BOLUS (SEPSIS): 500.0000 mL | Freq: Once | INTRAVENOUS | Status: DC

## 2016-12-22 MED ORDER — GABAPENTIN 400 MG PO CAPS
400.0000 mg | ORAL_CAPSULE | Freq: Every day | ORAL | Status: DC
  Administered 2016-12-22 – 2016-12-23 (×2): 400 mg via ORAL
  Filled 2016-12-22 (×2): qty 1

## 2016-12-22 MED ORDER — PANTOPRAZOLE SODIUM 40 MG PO TBEC
40.0000 mg | DELAYED_RELEASE_TABLET | Freq: Every day | ORAL | Status: DC
  Administered 2016-12-22 – 2016-12-26 (×5): 40 mg via ORAL
  Filled 2016-12-22 (×6): qty 1

## 2016-12-22 MED ORDER — ACETAMINOPHEN 325 MG PO TABS
650.0000 mg | ORAL_TABLET | Freq: Four times a day (QID) | ORAL | Status: DC | PRN
  Administered 2016-12-22 – 2016-12-23 (×3): 650 mg via ORAL
  Filled 2016-12-22 (×3): qty 2

## 2016-12-22 MED ORDER — GUAIFENESIN 100 MG/5ML PO SOLN
5.0000 mL | ORAL | Status: DC | PRN
  Administered 2016-12-22 – 2016-12-25 (×5): 100 mg via ORAL
  Filled 2016-12-22 (×5): qty 5

## 2016-12-22 MED ORDER — RIVAROXABAN 20 MG PO TABS
20.0000 mg | ORAL_TABLET | Freq: Every day | ORAL | Status: DC
  Administered 2016-12-22 – 2016-12-25 (×4): 20 mg via ORAL
  Filled 2016-12-22 (×5): qty 1

## 2016-12-22 MED ORDER — SODIUM CHLORIDE 0.9 % IV SOLN
INTRAVENOUS | Status: DC
Start: 2016-12-22 — End: 2016-12-22

## 2016-12-22 MED ORDER — ASPIRIN EC 81 MG PO TBEC
81.0000 mg | DELAYED_RELEASE_TABLET | Freq: Every day | ORAL | Status: DC
  Administered 2016-12-22 – 2016-12-26 (×5): 81 mg via ORAL
  Filled 2016-12-22 (×6): qty 1

## 2016-12-22 MED ORDER — DEXTROSE 5 % IV SOLN: 1.0000 g | INTRAVENOUS | Status: DC

## 2016-12-22 MED ORDER — CEFAZOLIN SODIUM-DEXTROSE 2-4 GM/100ML-% IV SOLN
2.0000 g | Freq: Three times a day (TID) | INTRAVENOUS | Status: DC
  Administered 2016-12-22: 2 g via INTRAVENOUS
  Filled 2016-12-22: qty 100

## 2016-12-22 MED ORDER — VANCOMYCIN HCL 10 G IV SOLR
1750.0000 mg | INTRAVENOUS | Status: DC
  Administered 2016-12-23 – 2016-12-25 (×3): 1750 mg via INTRAVENOUS
  Filled 2016-12-22 (×3): qty 1750

## 2016-12-22 NOTE — ED Notes (Signed)
Attempted report 

## 2016-12-22 NOTE — ED Notes (Signed)
Lunch tray ordered 

## 2016-12-22 NOTE — ED Notes (Signed)
Patient transported to CT scan . 

## 2016-12-22 NOTE — H&P (Signed)
Date: 12/22/2016               Patient Name:  Sharon Vasquez [name]: [id number]  DOB: [birth year] Age / Sex: 66 y.o., female   PCP: Lucious Groves, DO         Medical Service: Internal Medicine Teaching Service         Attending Physician: Dr. Sid Falcon, MD    First Contact: Dr. Trilby Drummer Pager: [phone]  Second Contact: Dr. Reesa Chew Pager: [phone]       After Hours (After 5p/  First Contact Pager: [phone]  weekends / holidays): Second Contact Pager: [phone]   Chief Complaint: Shortness of breath  History of Present Illness: 66 y.o f with pmh of heart failure with preserved ejection fraction, paroxysmal atrial fibrillation, chronic venous stasis of both lower extremities status post iliac vein stent in 01/2016, essential htn, peripheral vascular disease who presented with a 2 day history of shortness of breath. She states that she has been having a 7/10 chest pain (2/10 in the ED) that is tight in nature, non pleuritic, and non positional for the past 2 days. She has accompanied dry cough, chills, diarrhea three times this week, and postnasal drip. She states that she eats all types of food and does not salt or fluid restrict.   She has also noticed swelling in her bilateral legs and redness in her left leg 4 days ago. She states that her legs are sore. The patient was recently treated for cellulitis in the left leg in June 2018 with keflex.   She denies nausea/vomiting, lightheadedness, dysuria, urinary hesitation, weight gain, sneezing, recent sick contacts, fever.    ED Course: EMS gave duoneb for wheezing and placed on 5L of oxygen saturated at 96% In the ED given ivf bolus, code sepsis called, vancomycin and zosyn given   Meds:  Current Meds  Medication Sig  . albuterol (PROAIR HFA) 108 (90 Base) MCG/ACT inhaler Inhale 2 puffs into the lungs every 6 (six) hours as needed for wheezing or shortness of breath.  [name] albuterol (PROVENTIL) (2.5 MG/3ML) 0.083% nebulizer solution Take  3 mLs (2.5 mg total) by nebulization every 4 (four) hours as needed for wheezing or shortness of breath.  [name] amiodarone (PACERONE) 200 MG tablet Take 1 tablet (200 mg total) by mouth daily.  [name] aspirin EC 81 MG tablet Take 81 mg by mouth daily.  . benazepril (LOTENSIN) 20 MG tablet Take 1 tablet (20 mg total) by mouth daily.  [name] DOK 100 MG capsule TK 1 C PO BID PRN FOR MODERATE CONSTIPATION  . esomeprazole (NEXIUM) 40 MG capsule Take 1 capsule (40 mg total) by mouth daily as needed (for heartburn or indigestion).  . Fluticasone-Salmeterol (ADVAIR DISKUS) 100-50 MCG/DOSE AEPB Inhale 1 puff into the lungs 2 (two) times daily.  [name] gabapentin (NEURONTIN) 400 MG capsule Take 1 capsule (400 mg total) by mouth daily. (Patient taking differently: Take 400 mg by mouth 3 (three) times daily. )  . naproxen (NAPROSYN) 500 MG tablet Take 1 tablet (500 mg total) by mouth daily as needed for moderate pain (take with food).  . potassium chloride (K-DUR,KLOR-CON) 10 MEQ tablet Take 1 tablet (10 mEq total) by mouth 2 (two) times daily.  . rivaroxaban (XARELTO) 20 MG TABS tablet Take 1 tablet (20 mg total) by mouth every morning.  . tiotropium (SPIRIVA HANDIHALER) 18 MCG inhalation capsule Place 1 capsule (18 mcg total) into inhaler and inhale daily.  [name] torsemide (  DEMADEX) 20 MG tablet Take 1 tablet (20 mg total) by mouth 2 (two) times daily.     Allergies: Allergies as of 12/21/2016  . (No Known Allergies)   Past Medical History:  Diagnosis Date  . A-fib ([place])   . CHF (congestive heart failure) ([place])   . COPD (chronic obstructive pulmonary disease) ([place])   . Hyperlipidemia   . Hypertension   . Morbid obesity ([place])   . PE (pulmonary embolism)     Family History:   Family History  Problem Relation Age of Onset  . Breast cancer Mother   . Hypertension Mother   . Hyperlipidemia Mother   . Hypertension Maternal Grandfather   . Hyperlipidemia Maternal Grandfather     Social History:  Social History    Social History  . Marital status: Single    Spouse name: N/A  . Number of children: N/A  . Years of education: N/A   Social History Main Topics  . Smoking status: Current Some Day Smoker    Packs/day: 0.50    Years: 54.00    Types: Cigarettes    Start date: 08/16/1961  . Smokeless tobacco: Never Used     Comment: smoking 4-5 cigarettes/ day  . Alcohol use No  . Drug use: No  . Sexual activity: Not Asked   Other Topics Concern  . None   Social History Narrative   Lives in [facility] in [place]. Lives alone, but is dependent in ADLs/IADLs. 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 Two daughters live nearby. Previously resided in [place].    Review of Systems: Last bowel movement was few days ago and has not urinated the day of admission  Physical Exam: Blood pressure (!) 116/55, pulse 70, temperature (!) 100.4 F (38 C), temperature source Oral, resp. rate (!) 25, height 6\' 2"  (1.88 m), weight (!) 309 lb (140.2 kg), SpO2 98 %.  Physical Exam  Constitutional: She appears well-developed and well-nourished. She appears lethargic. She is sleeping.  HENT:  Head: Normocephalic and atraumatic.  Cardiovascular: Normal rate, regular rhythm, normal heart sounds and intact distal pulses.   Pulmonary/Chest: Effort normal. No respiratory distress. She has wheezes (throughout lung fields ).  Abdominal: Soft. Bowel sounds are normal. She exhibits no distension. There is no tenderness.  Musculoskeletal: She exhibits edema (bilateral legs 1+) and tenderness (bilateral extremities).  Neurological: She appears lethargic.  Skin: Skin is warm. Rash (left leg) noted. There is erythema (left leg).  Psychiatric: She has a normal mood and affect. Her behavior is normal. Judgment and thought content normal.   ROS Physical Exam  Constitutional: She appears well-developed and well-nourished. She appears lethargic. She is sleeping.  HENT:  Head:  Normocephalic and atraumatic.  Cardiovascular: Normal rate, regular rhythm, normal heart sounds and intact distal pulses.   Pulmonary/Chest: Effort normal. No respiratory distress. She has wheezes (throughout lung fields ).  Abdominal: Soft. Bowel sounds are normal. She exhibits no distension. There is no tenderness.  Musculoskeletal: She exhibits edema (bilateral legs 1+) and tenderness (bilateral extremities).  Neurological: She appears lethargic.  Skin: Skin is warm. Rash (left leg) noted. There is erythema (left leg).  Psychiatric: She has a normal mood and affect. Her behavior is normal. Judgment and thought content normal.   Labs: Bnp=154.1 Ua: negative nitrites, negative leukocytes, 30 protein, no bacteria Cmp: cr=1.37, glu=147, na=135, k=3.7, ast=19, alt=18, total bili=1.4 Cbc: wbc=15.6,hb=13.4, platelets=351,  Troponin: 0.08,  Lactic acidosis=1.53 Protime=22.7  INR=2.02   EKG: sinus tachycardia, premature atrial complex, left atrial enlargement, lvh  CXR: Cardiomegaly with mild CHF/volume overload. No evidence of pneumonia.  CT angio (12/22/16):  1. Negative for acute pulmonary embolus or aortic dissection 2. Cardiomegaly. Hazy bilateral ground-glass densities suspicious for pulmonary edema. 3. Enlarged spleen  Assessment & Plan by Problem:  Sepsis Cellulitis  ?Lymphedema The patient has a tempterature of 102, hr of 70-108, resp rate>20, wbc= 15.6,and high likelihood of site of infection all of which indicate sepsis criteria. On exam, the patient's left lower extremity was erythematous, edematous, tender to palpation, warm all of which indicate possibility of an infection.   Since the patient has a history of chronic venous stasis lymphedema should also be considered as a cause of the edema.  -blood cultures x2 ordreed -vancomycin and zosyn started   Acute respiratory distress  Ct angiography ruled out for any presence of acute pe or aortic dissection Cxr does not  show any evidence of pneumonia but does show presence of mild chf. The bnp is mildly elevated at 154. The patient's respiratory distress is unlikely to be due to chf exacerbation since the bnp is low and exam did not indicate any fluid overload. However, the patient did present with wheezing and has a history of copd. It is possible that the patient has a copd exacerbation that is causing the respiratory distress. -Troponin negative -Duonebs 4x daily and dulera 2x daily  Essential Hypertension Since admission the patient's blood pressure has been ranging 92-127/53-79 -will hold patient's home bp medication   Dispo: Admit patient to Inpatient with expected length of stay greater than 2 midnights.  Signed: Lars Mage, MD Internal Medicine PGY1 [phone] 12/22/2016, 4:11 AM

## 2016-12-22 NOTE — Progress Notes (Signed)
Pharmacy student rounding with the Internal Medicine B2 [facility]. SD is a 66 year old female presenting with sepsis, cellulitis, and chronic venous stasis lymph edema. On exam, as noted by physician, patient's left lower extremity was noted to be erythematous, edematous, tender to palpation, and warm. This is concerning for the possibility of  DVT, therefore, proper prophylaxis is necessary. Patient is currently taking rivaroxaban (Xarelto) 20 mg daily due to past PE. Please ensure patient takes this medication with food preferably the largest meal of the day for her as she states she is currently on a 700 calorie diet. When studied, under fasting conditions, pharmacokinetic parameters of the 15 mg and 20 mg rivaroxaban doses were less than dose proportional. However, when taken with food, high bioavailability (? 80%) was achieved.  Stampfuss J, Kubitza D, Becka M, Mueck W. The effect of food on the absorption and pharmacokinetics of rivaroxaban. Int J Clin Pharmacol Ther. 2013 Jul;51(7):549-61. doi: 68.2574/VT552174.

## 2016-12-22 NOTE — ED Notes (Signed)
ordered pt chicken salad sandwich and gave her coffee.

## 2016-12-22 NOTE — Progress Notes (Signed)
Sharon Vasquez is a 66 y.o. female patient admitted from ED awake, alert - oriented  X 4 - no acute distress noted.  VSS - Blood pressure (!) 126/55, pulse 76, temperature 99.8 F (37.7 C), temperature source Oral, resp. rate (!) 22, height 6\' 2"  (1.88 m), weight (!) 146.9 kg (323 lb 13.7 oz), SpO2 93 %.    IV in place, occlusive dsg intact without redness.  Orientation to room, and floor completed with information packet given to patient/family.  Patient declined safety video at this time.  Admission INP armband ID verified with patient/family, and in place.   SR up x 2, fall assessment complete, with patient and family able to verbalize understanding of risk associated with falls, and verbalized understanding to call nsg before up out of bed.  Call light within reach, patient able to voice, and demonstrate understanding.       Will cont to eval and treat per MD orders.  [place], RN 12/22/2016 8:08 PM

## 2016-12-22 NOTE — Progress Notes (Addendum)
Pharmacy Antibiotic Note  Sharon Vasquez is a 66 y.o. female admitted on 12/21/2016 with cellulitis.  Pharmacy has been consulted for vancomycin dosing. Tmax 102.5, wbc 15.6, LA down 0.96. SCr 1.53, norm CrCl~48.  Loaded with vanc/zosyn in the ED last night at 2355 - last dose of vancomycin 1g at ~0905 per MD.  Plan: MD d/c'd ceftriaxone - f/u need to continue GN coverage? Vancomycin 1750mg  IV q24h to start at 0300 Monitor clinical progress, c/s, renal function F/u de-escalation plan/LOT, vancomycin trough as indicated     Height: 6\' 2"  (188 cm) Weight: (!) 309 lb (140.2 kg) IBW/kg (Calculated) : 77.7  Temp (24hrs), Avg:101.5 F (38.6 C), Min:100.4 F (38 C), Max:102.5 F (39.2 C)   Recent Labs Lab 12/21/16 2200 12/21/16 2212 12/22/16 0039 12/22/16 0605  WBC 15.6*  --   --   --   CREATININE 1.37*  --   --  1.53*  LATICACIDVEN  --  1.53 0.96  --     Estimated Creatinine Clearance: 57.8 mL/min (A) (by C-G formula based on SCr of 1.53 mg/dL (H)).    No Known Allergies  Elicia Lamp, PharmD, BCPS Clinical Pharmacist Rx Phone # for today: [phone] After 3:30PM, please call Main Rx: [phone] 12/22/2016 1:06 PM

## 2016-12-22 NOTE — Progress Notes (Signed)
   Subjective: Sharon Vasquez was seen in her ED room. She was resting somewhat uncomfortably in her bed as it was a little small for her, but in no distress. She complains of leg pain, especially her L leg.   Objective:  Vital signs in last 24 hours: Vitals:   12/22/16 1508 12/22/16 1509 12/22/16 1600 12/22/16 1630  BP:   (!) 127/95 (!) 132/99  Pulse: 79 83 82 79  Resp: (!) 28 (!) 26    Temp:      TempSrc:      SpO2: 96% 99% 96% 98%  Weight:      Height:       Physical Exam  Constitutional: She is oriented to person, place, and time. She appears well-developed and well-nourished.  HENT:  Head: Normocephalic and atraumatic.  Eyes: EOM are normal. Right eye exhibits no discharge. Left eye exhibits no discharge.  Cardiovascular: Normal rate, regular rhythm and normal heart sounds.   Pulmonary/Chest: Effort normal. She has wheezes.  Wheezing appeared to transmitted from upper airaway  Abdominal: Soft. Bowel sounds are normal. She exhibits no distension. There is no tenderness.  Musculoskeletal: She exhibits edema and tenderness.  Edema of Bilater LEs L>R Erythema of L leg Warmth of L leg L leg tender to palpation  Neurological: She is alert and oriented to person, place, and time.  Skin: Skin is warm and dry.  LLE cellulitic border marked  Psychiatric: She has a normal mood and affect.   Assessment/Plan:  Sepsis secondary to Cellulitis: Patient presented with Temp 102, HR 70-108, RR >20, WBC 15.6, and Infection source. On exam, patient's LLE was erythematous, edematous, tender to palpation, and warm. Blood Cultures x2 NGTD. Patient Initially started on Vancomycin and Zosyn. Zosyn discontinued. Swelling significantly greater in LLE than RLE and patient has a history of PE (on Xarelto, but not taking with food), will evaluate for DVT. - Vancomycin (per pharmacy)  - Lower Extremity Dopplar - CBC in AM  Shortness of Breath, COPD: CTA in ED ruled out PE. CXR negative for pneumonia but  did show evidence of mild CHF. BNP 154. SOB unlikely to be 2/2 CHF exacerbation without signifcant BNP elevation of volume overload on exam.  - Oxygen - Duonebs q6h - Dulera BID  Chest Pain: Troponin 0.08, 0.06. EKG nonischemic.  - Trend troponin - Repeat EKG  Hyperkalemia: K 3.1 on 9/21.  - Potassium 7mEq PO - BMP in AM  Essential Hypertension: hypotensive to Normotensive - Holding home BP Medication  Dispo: Anticipated discharge in approximately 1-3 day(s).   Neva Seat, MD 12/22/2016, 5:37 PM Pager: [phone]

## 2016-12-22 NOTE — ED Notes (Signed)
PA notified on pt.'s elevated Troponin  Level.

## 2016-12-22 NOTE — ED Provider Notes (Signed)
Nurse informed me that pt trop is elevated at .08. This was reviewed and appears to be baseline for pt. EKG shows no ischemic changes. Will likely need to trend trop doubt acute acs. Informed nurse to update admitting team.   Doristine Devoid, PA-C 12/22/16 0136    Charlesetta Shanks, MD 12/30/16 [phone]

## 2016-12-23 ENCOUNTER — Inpatient Hospital Stay (HOSPITAL_COMMUNITY): Payer: Medicare Other

## 2016-12-23 DIAGNOSIS — M7989 Other specified soft tissue disorders: Secondary | ICD-10-CM

## 2016-12-23 DIAGNOSIS — M79609 Pain in unspecified limb: Secondary | ICD-10-CM

## 2016-12-23 LAB — CBC
HCT: 33.7 % — ABNORMAL LOW (ref 36.0–46.0)
Hemoglobin: 11.1 g/dL — ABNORMAL LOW (ref 12.0–15.0)
MCH: 28.8 pg (ref 26.0–34.0)
MCHC: 32.9 g/dL (ref 30.0–36.0)
MCV: 87.3 fL (ref 78.0–100.0)
Platelets: 315 10*3/uL (ref 150–400)
RBC: 3.86 MIL/uL — ABNORMAL LOW (ref 3.87–5.11)
RDW: 15.5 % (ref 11.5–15.5)
WBC: 12.5 10*3/uL — ABNORMAL HIGH (ref 4.0–10.5)

## 2016-12-23 LAB — TROPONIN I: Troponin I: 0.06 ng/mL (ref <0.03)

## 2016-12-23 MED ORDER — TORSEMIDE 20 MG PO TABS
20.0000 mg | ORAL_TABLET | Freq: Two times a day (BID) | ORAL | Status: DC
  Administered 2016-12-23 – 2016-12-26 (×6): 20 mg via ORAL
  Filled 2016-12-23 (×6): qty 1

## 2016-12-23 MED ORDER — DEXTROSE 5 % IV SOLN
1.0000 g | INTRAVENOUS | Status: DC
  Administered 2016-12-23 – 2016-12-25 (×3): 1 g via INTRAVENOUS
  Filled 2016-12-23 (×4): qty 10

## 2016-12-23 MED ORDER — IPRATROPIUM-ALBUTEROL 0.5-2.5 (3) MG/3ML IN SOLN
3.0000 mL | Freq: Three times a day (TID) | RESPIRATORY_TRACT | Status: DC
  Administered 2016-12-23 – 2016-12-26 (×9): 3 mL via RESPIRATORY_TRACT
  Filled 2016-12-23 (×8): qty 3

## 2016-12-23 MED ORDER — GABAPENTIN 400 MG PO CAPS
400.0000 mg | ORAL_CAPSULE | Freq: Three times a day (TID) | ORAL | Status: DC
  Administered 2016-12-23 – 2016-12-26 (×9): 400 mg via ORAL
  Filled 2016-12-23 (×10): qty 1

## 2016-12-23 NOTE — Progress Notes (Signed)
*  PRELIMINARY RESULTS* Vascular Ultrasound Left lower extremity venous duplex has been completed.  Preliminary findings: no evidence of DVT in visualized veins.  Landry Mellow, RDMS, RVT  12/23/2016, 12:27 PM

## 2016-12-23 NOTE — Progress Notes (Signed)
Pharmacy Antibiotic Note  Sharon Vasquez is a 66 y.o. female admitted on 12/21/2016 with RLE cellulitis.  Pharmacy has been consulted for ceftriaxone dosing for gram negative coverage. Patient now afebrile, wbc 12.5, LA 0.96.   Plan: Ceftriaxone 1 gram IV q24h Vancomycin 1750 gm IV q24h Monitor clinical progress, c/s, renal function Follow up de-escalation plan/length of therapy, vancomycin trough before dose 4-5  Height: 6\' 2"  (188 cm) Weight: (!) 322 lb 5 oz (146.2 kg) IBW/kg (Calculated) : 77.7  Temp (24hrs), Avg:99.6 F (37.6 C), Min:99.3 F (37.4 C), Max:99.8 F (37.7 C)   Recent Labs Lab 12/21/16 2200 12/21/16 2212 12/22/16 0039 12/22/16 0605 12/23/16 0503  WBC 15.6*  --   --   --  12.5*  CREATININE 1.37*  --   --  1.53*  --   LATICACIDVEN  --  1.53 0.96  --   --     Estimated Creatinine Clearance: 59.2 mL/min (A) (by C-G formula based on SCr of 1.53 mg/dL (H)).    No Known Allergies  Antimicrobials this admission:  Vanc 9/20>> Zosyn 9/20>>9/21 (1 dose) Cefazolin 9/21 x 1 dose Ceftriaxone 9/22>>   Microbiology results: 9/20 blood cx x 2>>ngtd 9/21 MRSA PCR >> neg  Thank you for allowing pharmacy to be a part of this patient's care.  Charlene Brooke, PharmD PGY1 Pharmacy Resident Phone: [phone] After 3:30PM please call [place] [phone] 12/23/2016 1:13 PM

## 2016-12-23 NOTE — Progress Notes (Signed)
   Subjective: patient was complaining of worsening swelling of her lower extremities, stating that she is not getting her torsemide during this stay. Torsemide was held initially because of her softer blood pressure. She was also complaining of more pain in left leg extending up to her thighs.  Objective:  Vital signs in last 24 hours: Vitals:   12/22/16 2043 12/22/16 2148 12/23/16 0524 12/23/16 0728  BP:  124/68 105/65   Pulse:  85 71   Resp:  19 17   Temp:  99.3 F (37.4 C) 99.6 F (37.6 C)   TempSrc:  Oral Oral   SpO2: 94% 97% 95% 96%  Weight:   (!) 322 lb 5 oz (146.2 kg)   Height:       Gen. Well-developed, obese lady, in no acute distress. Extremities. Marked erythema and edema of left leg, appears little worse  then yesterday. 2+ pitting edema with some signs of venous stasis on right lower extremity. Chest. Few scattered expiratory wheezes. CV. Regular rate and rhythm. Abdomen. Soft, obese, non tender, bowel sounds positive.  Assessment/Plan:  Sepsis secondary to Cellulitis: patient remained afebrile overnight, white cell counts trending down, might be some dilutional effect because of IV fluids as stressed of her counts are little less too. Her swelling and pain of left lower extremity is worsening. Partly might be due to no diuretic as her torsemide was held on admission because of her softer blood pressure. Vascular ultrasound for left lower extremity was negative for any DVT. Blood cultures remained negative. -Add ceftriaxone. -Continue vancomycin. -Continue monitoring.  Shortness of Breath, COPD: she continued to have mild shortness of breath with some scattered expiratory wheeze. Improved as compared to yesterday. -Continue DuoNeb every 6 hourly. -Continue Dulera twice a day.  Chest Pain: she denied any more chest pain. Troponin plateau at 0.06. -Check troponin one more time.  Essential Hypertension: she was normotensive today. -Restart her home dose of  torsemide. -keep holding benazepril.  History of A. Fib. Currantly in sinus rhythm. -continue amiodarone and Xarelto.  Dispo: Anticipated discharge in approximately 1-2 day(s).   Sharon Nimrod, MD 12/23/2016, 12:44 PM Pager: [phone]

## 2016-12-23 NOTE — Progress Notes (Signed)
  Date: 12/23/2016  Patient name: Sharon Vasquez  Medical record number: [id number]  Date of birth: Jul 19, 1950   This patient's plan of care was discussed with the house staff. Please see Dr. Latina Craver note for complete details. I concur with her findings.   Sid Falcon, MD 12/23/2016, 6:03 PM

## 2016-12-24 LAB — BASIC METABOLIC PANEL
Anion gap: 9 (ref 5–15)
BUN: 12 mg/dL (ref 6–20)
CO2: 23 mmol/L (ref 22–32)
Calcium: 8.6 mg/dL — ABNORMAL LOW (ref 8.9–10.3)
Chloride: 106 mmol/L (ref 101–111)
Creatinine, Ser: 1.07 mg/dL — ABNORMAL HIGH (ref 0.44–1.00)
GFR calc Af Amer: >60 mL/min (ref >60)
GFR calc non Af Amer: 52 mL/min — ABNORMAL LOW (ref >60)
Glucose, Bld: 85 mg/dL (ref 65–99)
Potassium: 3.9 mmol/L (ref 3.5–5.1)
Sodium: 138 mmol/L (ref 135–145)

## 2016-12-24 LAB — CBC
HCT: 35.2 % — ABNORMAL LOW (ref 36.0–46.0)
Hemoglobin: 11.5 g/dL — ABNORMAL LOW (ref 12.0–15.0)
MCH: 28.5 pg (ref 26.0–34.0)
MCHC: 32.7 g/dL (ref 30.0–36.0)
MCV: 87.1 fL (ref 78.0–100.0)
Platelets: 360 10*3/uL (ref 150–400)
RBC: 4.04 MIL/uL (ref 3.87–5.11)
RDW: 15.3 % (ref 11.5–15.5)
WBC: 10.3 10*3/uL (ref 4.0–10.5)

## 2016-12-24 MED ORDER — OXYCODONE-ACETAMINOPHEN 5-325 MG PO TABS
1.0000 | ORAL_TABLET | ORAL | Status: DC | PRN
  Administered 2016-12-24 (×2): 2 via ORAL
  Administered 2016-12-24: 1 via ORAL
  Administered 2016-12-25 – 2016-12-26 (×4): 2 via ORAL
  Filled 2016-12-24 (×3): qty 2
  Filled 2016-12-24: qty 1
  Filled 2016-12-24 (×3): qty 2

## 2016-12-24 MED ORDER — BENAZEPRIL HCL 20 MG PO TABS
20.0000 mg | ORAL_TABLET | Freq: Every day | ORAL | Status: DC
  Administered 2016-12-24 – 2016-12-26 (×3): 20 mg via ORAL
  Filled 2016-12-24 (×3): qty 1

## 2016-12-24 NOTE — Progress Notes (Signed)
  Date: 12/24/2016  Patient name: Sharon Vasquez  Medical record number: [id number]  Date of birth: 05-17-1950   I have seen and evaluated this patient and I have discussed the plan of care with the house staff. Please see Dr. Latina Craver note for complete details. I concur with her findings with the following additions/corrections:   Unclear why she is not improving.  She has no clot, she has recently checked relatively normal ABIs.  She has no open wounds.  She is in severe pain today, will add percocet to see if this helps.  Continue to monitor and anticipate she should improved and defervesce in 1-2 days.  Follow up cultures.   Sid Falcon, MD 12/24/2016, 12:43 PM

## 2016-12-24 NOTE — Progress Notes (Signed)
   Subjective: Patient was complaining of worsening pain in her left leg, starting from ankle and extending to her thighs. She did had fever of 101.1 last night around 10 PM. Patient was also concerned for her upcoming appointment of mammogram on Tuesday morning. If she is not ready for discharge by tomorrow, that appointment needs to be rescheduled.  Objective:  Vital signs in last 24 hours: Vitals:   12/23/16 2356 12/24/16 0529 12/24/16 0558 12/24/16 0601  BP:   (!) 150/78 (!) 151/73  Pulse:  70 70 72  Resp:  19 18   Temp: 100.1 F (37.8 C) 98.8 F (37.1 C)  99.1 F (37.3 C)  TempSrc: Oral Oral  Oral  SpO2:  97% 98% 100%  Weight:      Height:       Gen. Well-developed, obese lady,appears little uncomfortable because of pain, in no acute distress. Extremities. Marked erythema and edema of left leg, appears pretty much the same as yesterday, 1+ pitting edema with some signs of venous stasis on right lower extremity,right leg edema appears little improved. Chest. Few scattered expiratory wheezes, mostly clear lungs. CV. Regular rate and rhythm. Abdomen. Soft, obese, non tender, bowel sounds positive.  Assessment/Plan:  Sepsis secondary to Cellulitis: she did spike fever last night, we broaden her coverage and added ceftriaxone yesterday. She remained afebrile since this morning. Blood culture remained negative.she was complaining of more pain in her left leg. -Continue ceftriaxone and vancomycin, keep monitoring if she continued to spike fever we will broaden up her coverage. -Percocet every 4 hourly when necessary for pain. -continue her home dose of torsemide.  Shortness of Breath, COPD: she was having very mild wheezing, helped her breathing treatment little before my exam. According to patient her breathing is improving. -Continue DuoNeb every 6 hourly. -Continue Dulera twice a day.  Chest Pain: Resolved, troponin obtained at 0.06 on recheck again yesterday.  Essential  Hypertension. Blood pressure elevated today. -Restart her home dose of benazepril.  History of A. Fib. Currantly in sinus rhythm. -continue amiodarone and Xarelto.   Dispo: Anticipated discharge in approximately 1-2 day(s).   Lorella Nimrod, MD 12/24/2016, 10:52 AM Pager: [phone]

## 2016-12-25 ENCOUNTER — Other Ambulatory Visit: Payer: Self-pay | Admitting: Internal Medicine

## 2016-12-25 LAB — VANCOMYCIN, TROUGH: Vancomycin Tr: 7 ug/mL — ABNORMAL LOW (ref 15–20)

## 2016-12-25 MED ORDER — RIVAROXABAN 20 MG PO TABS: 20.0000 mg | ORAL_TABLET | Freq: Every day | ORAL | Status: DC

## 2016-12-25 MED ORDER — VANCOMYCIN HCL 10 G IV SOLR
1250.0000 mg | Freq: Two times a day (BID) | INTRAVENOUS | Status: DC
  Filled 2016-12-25: qty 1250

## 2016-12-25 MED ORDER — WHITE PETROLATUM GEL
Status: AC
  Filled 2016-12-25: qty 1

## 2016-12-25 MED ORDER — POTASSIUM CHLORIDE CRYS ER 10 MEQ PO TBCR
10.0000 meq | EXTENDED_RELEASE_TABLET | Freq: Two times a day (BID) | ORAL | Status: DC
  Administered 2016-12-25 – 2016-12-26 (×3): 10 meq via ORAL
  Filled 2016-12-25 (×3): qty 1

## 2016-12-25 MED ORDER — IBUPROFEN 600 MG PO TABS: 600.0000 mg | ORAL_TABLET | Freq: Four times a day (QID) | ORAL | Status: DC | PRN

## 2016-12-25 NOTE — Progress Notes (Signed)
Orthopedic Tech Progress Note Patient Details:  Sharon Vasquez 1950/08/15 [id number]  Ortho Devices Type of Ortho Device: Unna boot Ortho Device/Splint Location: applied unna boot to pt lower legs (bilateral).  pt tolerated application very well.   Ortho Device/Splint Interventions: Application   Kristopher Oppenheim 12/25/2016, 6:45 PM

## 2016-12-25 NOTE — Telephone Encounter (Signed)
PT CALLED AND WANTS HER PAIN MEDICATION INCREASED AND WANTS TO KNOW IF A BOOT HAS BEEN ORDERED FOR HER LEG

## 2016-12-25 NOTE — Progress Notes (Signed)
Pharmacy Antibiotic Note  Sharon Vasquez is a 66 y.o. female admitted on 12/21/2016 with cellulitis.  Pharmacy has been consulted for Vancocin and Rocephin dosing.  Vanc trough below goal, renal function has improved.  Plan: Change vancomycin to 1250mg  IV every 12 hours for calculated trough of ~14.  Goal trough 10-15 mcg/mL. Continue Rocephin 1g IV every 24 hours.  Height: 6\' 2"  (188 cm) Weight: (!) 322 lb 5 oz (146.2 kg) IBW/kg (Calculated) : 77.7  Temp (24hrs), Avg:98.8 F (37.1 C), Min:98.3 F (36.8 C), Max:99.1 F (37.3 C)   Recent Labs Lab 12/21/16 2200 12/21/16 2212 12/22/16 0039 12/22/16 0605 12/23/16 0503 12/24/16 0455 12/25/16 0248  WBC 15.6*  --   --   --  12.5* 10.3  --   CREATININE 1.37*  --   --  1.53*  --  1.07*  --   LATICACIDVEN  --  1.53 0.96  --   --   --   --   VANCOTROUGH  --   --   --   --   --   --  7*    Estimated Creatinine Clearance: 84.7 mL/min (A) (by C-G formula based on SCr of 1.07 mg/dL (H)).    No Known Allergies  Antimicrobials this admission: Vanc 9/20 >>  Zosyn 9/20 >> 9/21 (1 dose) Cefazolin 9/21 x 1 dose CTX 9/22 >>   Dose adjustments this admission: Vanc 1750mg  Q24 > truogh 7 > vanc 1250mg  Q12  Microbiology results: 9/20 blood cx x 2 >> ngtd 9/21 MRSA PCR >> neg  Thank you for allowing pharmacy to be a part of this patient's care.  Wynona Neat, PharmD, BCPS  12/25/2016 4:48 AM

## 2016-12-25 NOTE — Consult Note (Signed)
[facility] Nurse wound consult note Reason for Consult: Serum filled blister to left anterior lower leg, ruptured  Wears Unna boots at home Wound type:Venous insufficiency Pressure Injury POA: NA Measurement: 4 cm x 5 cm ruptured serum filled blister.  Is dry at this time.  Wound QLR:JPVG and moist Drainage (amount, consistency, odor) scant serous weeping bilateral lower legs Periwound:edema, chronic skin changes Dressing procedure/placement/frequency:Cleanse bilateral lower legs withsoap and water,.  Apply zinc layer secured with self adherent coban.  Change twice weekly.  [facility]o tech.  No topical therapy for blister.  Zinc will provide moist wound healing.  Will not follow at this time.  Please re-consult if needed.  Domenic Moras RN BSN [place] Pager [phone]

## 2016-12-25 NOTE — Telephone Encounter (Signed)
Pt is currently inpt, I have called 5w and informed the nurse that has the pt.

## 2016-12-25 NOTE — Progress Notes (Signed)
  Date: 12/25/2016  Patient name: Sharon Vasquez  Medical record number: [id number]  Date of birth: Nov 19, 1950   I have seen and evaluated this patient and I have discussed the plan of care with the house staff. Please see Dr. Tally Joe note for complete details. I concur with his findings with the following additions/corrections:   Her leg is improved, warmth on the left improved significantly.  Her continued pink/brown discoloration is partially chronic and partially resolving cellulitis.  She would like to be placed in UNNA boots and we consulted wound care for this evaluation today.    Sid Falcon, MD 12/25/2016, 1:32 PM

## 2016-12-25 NOTE — Progress Notes (Signed)
   Subjective: Sharon Vasquez continues to complain about LLE pain, starting from ankle and extending to her thighs. She also expressed frustration over how long it is taking for her symptoms to improve. She requested an Haematologist as she has used them in the past ([facility] to evaluate). 24hr Tmax of 99. She states her shortness of breath has resolved. Last BM on 9/19, but she says she typically goes 4-5 days between BMs and has been eating less here.  Objective:  Vital signs in last 24 hours: Vitals:   12/24/16 1739 12/24/16 2033 12/24/16 2125 12/25/16 0339  BP: (!) 160/75  123/70 (!) 111/59  Pulse: 72  77 67  Resp: 20  18 18   Temp: 99 F (37.2 C)  98.8 F (37.1 C) 98.3 F (36.8 C)  TempSrc: Oral  Oral Oral  SpO2:  98% 96% 98%  Weight:      Height:       Gen. Well-developed, obese lady,appears little uncomfortable because of pain, in no acute distress. Extremities. Marked erythema and edema of left leg, appears same as yesterday (improving), 1+ pitting edema with some signs of venous stasis on right lower extremity. Chest. Distant breath sounds, Clear throughout. CV. Regular rate and rhythm. Abdomen. Soft, obese, non tender, bowel sounds positive.  Assessment/Plan:  Sepsis secondary to Cellulitis: 24hr Tmax 99. Vancomycin, ceftriaxone (started 9/22 following fever spike) yesterday. Blood culture NGTD. Responding well ceftriaxone and MRSA negative, will d/c vancomycin. Continues to have pain in her LLE. - Continue ceftriaxone - Discontinue Vancomycin - Percocet q4h PRN, Ibuprofen 600mg  q6h PRN - Continue Torsemide, with K supplementation - Wound care consult - AM CBC and BMP  Shortness of Breath, COPD: Resolved -Continue DuoNeb TID. -Continue Dulera twice a day.  Chest Pain: Resolved, troponin obtained at 0.06 on recheck (9/22)  Essential Hypertension. Blood pressure low to normal today. - Continue benazepril  History of A. Fib. Currantly in sinus rhythm. -  Continue amiodarone  and Xarelto.   Dispo: Anticipated discharge in approximately 1-2 day(s).   Neva Seat, MD 12/25/2016, 8:33 AM Pager: [phone]

## 2016-12-26 ENCOUNTER — Other Ambulatory Visit: Payer: Medicare Other

## 2016-12-26 LAB — CULTURE, BLOOD (ROUTINE X 2)
Culture: NO GROWTH
Culture: NO GROWTH
Special Requests: ADEQUATE
Special Requests: ADEQUATE

## 2016-12-26 MED ORDER — CEFDINIR 300 MG PO CAPS: 300.0000 mg | ORAL_CAPSULE | Freq: Two times a day (BID) | ORAL | 0 refills | Status: DC

## 2016-12-26 MED ORDER — OXYCODONE-ACETAMINOPHEN 5-325 MG PO TABS: 1.0000 | ORAL_TABLET | Freq: Three times a day (TID) | ORAL | 0 refills | Status: AC | PRN

## 2016-12-26 NOTE — Care Management Important Message (Signed)
Important Message  Patient Details  Name: Sharon Vasquez [name]: [id number] Date of Birth: 24-Jan-1951   Medicare Important Message Given:  Yes    Nathen May 12/26/2016, 9:14 AM

## 2016-12-26 NOTE — Progress Notes (Signed)
Patient was discharged home by MD order; discharged instructions  review and give to patient with care notes and prescription; IV DIC; at this time, patient is waiting for her ride; patient will be escorted to the car by a volunteer via wheelchair.

## 2016-12-26 NOTE — Progress Notes (Signed)
   Subjective: Sharon Vasquez states that her lower extremity pain and swelling is improved after receiving unna boots. She states she feels much better and is ready to go home today.    Objective:  Vital signs in last 24 hours: Vitals:   12/25/16 2044 12/25/16 2212 12/26/16 0456 12/26/16 0612  BP:  122/72 (!) 161/83 (!) 115/48  Pulse:  77 78 78  Resp:  (!) 22 18   Temp:  98.6 F (37 C) 98.2 F (36.8 C)   TempSrc:  Oral Oral   SpO2: 97% 94% 97%   Weight:      Height:       Gen. Well-developed, obese lady, sitting comfortably in bed, in no acute distress. Extremities. Marked LLE edema (improving), 1+ pitting edema right lower extremity. Chest. Distant breath sounds, Clear throughout. CV. Regular rate and rhythm. Abdomen. Soft, obese, non tender, bowel sounds positive.  Assessment/Plan:  Sepsis secondary to Cellulitis: 24hr Tmax 98.2. Vancomycin, ceftriaxone (started 9/22 following fever spike). Blood culture NGTD. Responded well ceftriaxone and MRSA negative, vancomycin discontinued on 9/24. WOC came to see patient and applied unna boots. Patient remains afebrile without leukocytosis.Pain improved. Will transition to oral antibiotics for discharge.  - Transition to cefdinir PO at discharge - Percocet q4h PRN, Ibuprofen 600mg  q6h PRN - Continue Torsemide, with K supplementation  Shortness of Breath, COPD: Resolved -Continue DuoNeb TID. -Continue Dulera twice a day.  Chest Pain: Resolved, troponin obtained at 0.06 on recheck (9/22)  Essential Hypertension. Blood pressure low to normal today. - Continue benazepril  History of A. Fib. Currantly in sinus rhythm. -  Continue amiodarone and Xarelto.   Dispo: Anticipated discharge in approximately 0-1 day(s).   Neva Seat, MD 12/26/2016, 9:08 AM Pager: [phone]

## 2016-12-26 NOTE — Discharge Summary (Signed)
Name: Sharon Vasquez [name]: [id number] DOB: 16-Jun-1950 66 y.o. PCP: Lucious Groves, DO  Date of Admission: 12/21/2016  9:43 PM Date of Discharge: 12/26/2016 Attending Physician: Sid Falcon, MD  Discharge Diagnosis:  Sepsis Cellulitis Dyspnea  Discharge Medications: Allergies as of 12/26/2016   No Known Allergies     Medication List    TAKE these medications   albuterol 108 (90 Base) MCG/ACT inhaler Commonly known as:  PROAIR HFA Inhale 2 puffs into the lungs every 6 (six) hours as needed for wheezing or shortness of breath.   albuterol (2.5 MG/3ML) 0.083% nebulizer solution Commonly known as:  PROVENTIL Take 3 mLs (2.5 mg total) by nebulization every 4 (four) hours as needed for wheezing or shortness of breath.   amiodarone 200 MG tablet Commonly known as:  PACERONE Take 1 tablet (200 mg total) by mouth daily.   aspirin EC 81 MG tablet Take 81 mg by mouth daily.   benazepril 20 MG tablet Commonly known as:  LOTENSIN Take 1 tablet (20 mg total) by mouth daily.   cefdinir 300 MG capsule Commonly known as:  OMNICEF Take 1 capsule (300 mg total) by mouth 2 (two) times daily.   DOK 100 MG capsule Generic drug:  docusate sodium TK 1 C PO BID PRN FOR MODERATE CONSTIPATION   esomeprazole 40 MG capsule Commonly known as:  NEXIUM Take 1 capsule (40 mg total) by mouth daily as needed (for heartburn or indigestion).   Fluticasone-Salmeterol 100-50 MCG/DOSE Aepb Commonly known as:  ADVAIR DISKUS Inhale 1 puff into the lungs 2 (two) times daily.   gabapentin 400 MG capsule Commonly known as:  NEURONTIN Take 1 capsule (400 mg total) by mouth daily. What changed:  when to take this   naproxen 500 MG tablet Commonly known as:  NAPROSYN Take 1 tablet (500 mg total) by mouth daily as needed for moderate pain (take with food).   nicotine 21 mg/24hr patch Commonly known as:  NICODERM CQ - dosed in mg/24 hours Place 1 patch (21 mg total) onto the skin daily.     oxyCODONE-acetaminophen 5-325 MG tablet Commonly known as:  PERCOCET/ROXICET Take 1 tablet by mouth every 8 (eight) hours as needed for severe pain.   potassium chloride 10 MEQ tablet Commonly known as:  K-DUR,KLOR-CON Take 1 tablet (10 mEq total) by mouth 2 (two) times daily.   rivaroxaban 20 MG Tabs tablet Commonly known as:  XARELTO Take 1 tablet (20 mg total) by mouth every morning.   senna-docusate 8.6-50 MG tablet Commonly known as:  Senokot-S Take 1 tablet by mouth 2 (two) times daily.   tiotropium 18 MCG inhalation capsule Commonly known as:  SPIRIVA HANDIHALER Place 1 capsule (18 mcg total) into inhaler and inhale daily.   torsemide 20 MG tablet Commonly known as:  DEMADEX Take 1 tablet (20 mg total) by mouth 2 (two) times daily.            Discharge Care Instructions        Start     Ordered   12/26/16 0000  oxyCODONE-acetaminophen (PERCOCET/ROXICET) 5-325 MG tablet  Every 8 hours PRN     12/26/16 1039   12/26/16 0000  cefdinir (OMNICEF) 300 MG capsule  2 times daily     12/26/16 1039   12/26/16 0000  Increase activity slowly     12/26/16 1039   12/26/16 0000  Diet - low sodium heart healthy     12/26/16 1039   12/26/16 0000  Discharge instructions    Comments:  Thank you for allowing [place] to care for you  Your symptoms are believed to be from an Infection of the skin of your left leg. - Please take your antibiotic CEFDINIR 300mg , two times daily, for 7 days - We have prescribed you 5 days of pain medication to control your pain while you heal  - Please follow up with your PCP in 1 week about this admission and to have your unna boot changed.  Please follow up with your primary care provider in the next 2 weeks.   12/26/16 1039      Disposition and follow-up:   [name] was discharged from [facility] in Good condition.  At the hospital follow up visit please address:  1.  Cellulitis: Reevaluate left leg during unna boot  change. Ensure patient has completed antibiotic course.  2.  Labs / imaging needed at time of follow-up: None  3.  Pending labs/ test needing follow-up: None  Follow-up Appointments:   Patient has a follow up scheduled on Oct 2 at 10:15 am with [facility].  Hospital Course by problem list:   Sepsis secondary to cellulitis: The patient presented febrile to 102, with Heart Rate 70-108, Respirations >20, and WBC 15.6, with cellulitic change of her left leg. On exam, the patient's left lower extremity was erythematous, edematous, tender and warm to palpation. This was in addition to her chronic venous stasis disease, skin changes and edema. Blood cultures were obtained, which were ultimately negative. Vancomycin and zosyn were initiated in the ED and zosyn was discontinued on admission. The patient's edema was significantly greater on the L than the R (and she had a history of PE, on Xarelto, but not taking with food consistently). She received a lower extremity doppler, which was negative. The following day ceftriaxone was added to her antibiotic regimen, she responded well and was MRSA negative so vancomycin was discontinued. The patient was transitioned to cefdinir on discharge. The patient experienced significant left leg pain during in her admission. She was given pain medications which helped control this pain and once her cellulitis had improved, a wound consult was placed to evaluate for unna boots (which the patient had had success with in the past for her leg swelling and pain). She recieved unna boots and was discharged with 5 days of pain medication and instructions to have her unna boots changed at her follow up clinic visit.  Dyspnea: Patient underwent CT Angio in ED, which ruled out for any presence of acute pe or aortic dissection. CXR was negative for pneumonia, but did show evidence of mild CHF. BNP was 154. She presented with wheezing on exam and a history of copd. She  received Duonebs q6h and Dulera BID. Her shortness of breath and wheezing resolved after 3 days.  Discharge Vitals:   BP (!) 115/48 Comment: Reassessment  Pulse 78   Temp 98.2 F (36.8 C) (Oral)   Resp 16   Ht 6\' 2"  (1.88 m)   Wt (!) 322 lb 5 oz (146.2 kg)   SpO2 95%   BMI 41.38 kg/m   Pertinent Labs, Studies, and Procedures:  CBC Latest Ref Rng & Units 12/24/2016 12/23/2016 12/21/2016  WBC 4.0 - 10.5 K/uL 10.3 12.5(H) 15.6(H)  Hemoglobin 12.0 - 15.0 g/dL 11.5(L) 11.1(L) 13.4  Hematocrit 36.0 - 46.0 % 35.2(L) 33.7(L) 39.4  Platelets 150 - 400 K/uL 360 315 351   BMP Latest Ref Rng &  Units 12/24/2016 12/22/2016 12/21/2016  Glucose 65 - 99 mg/dL 85 152(H) 147(H)  BUN 6 - 20 mg/dL 12 19 18   Creatinine 0.44 - 1.00 mg/dL 1.07(H) 1.53(H) 1.37(H)  BUN/Creat Ratio 12 - 28 - - -  Sodium 135 - 145 mmol/L 138 138 135  Potassium 3.5 - 5.1 mmol/L 3.9 3.1(L) 3.7  Chloride 101 - 111 mmol/L 106 104 106  CO2 22 - 32 mmol/L 23 26 24   Calcium 8.9 - 10.3 mg/dL 8.6(L) 8.4(L) 8.8(L)   DG Chest: IMPRESSION: - Cardiomegaly with mild CHF/volume overload. No evidence of pneumonia.  CT Angio Chest PE: IMPRESSION: 1. Negative for acute pulmonary embolus or aortic dissection 2. Cardiomegaly. Hazy bilateral ground-glass densities suspicious for pulmonary edema. 3. Enlarged spleen  Left Lower Extremity Venous Doppler: Summary: - Multiple enlarged lymph nodes left groin. - No obvious evidence of deep vein thrombosis left lower extremity. - No evidence of baker's cyst.  Discharge Instructions: Discharge Instructions    Diet - low sodium heart healthy    Complete by:  As directed    Discharge instructions    Complete by:  As directed    Thank you for allowing [place] to care for you  Your symptoms are believed to be from an Infection of the skin of your left leg. - Please take your antibiotic CEFDINIR 300mg , two times daily, for 7 days - We have prescribed you 5 days of pain medication to control your pain  while you heal  - Please follow up with your PCP in 1 week about this admission and to have your unna boot changed.  Please follow up with your primary care provider in the next 2 weeks.   Increase activity slowly    Complete by:  As directed       Signed: Neva Seat, MD 12/27/2016, 2:39 PM   Pager: [phone]

## 2016-12-26 NOTE — Progress Notes (Signed)
  Date: 12/26/2016  Patient name: Sharon Vasquez  Medical record number: [id number]  Date of birth: 12/13/1950   I have seen and evaluated this patient and I have discussed the plan of care with the house staff. Please see Dr. Tally Joe note for complete details. I concur with his findings with the following additions/corrections:   She was having significant pain due to swelling.  We discussed continuing her percocet.  We will send her home with a 5 day supply and I advised her to follow up with her PCP to discuss if this is appropriate as a long term medication.  She will need an appointment in 1 week in our clinic to assess need for [facility] boot replacement.    Sid Falcon, MD 12/26/2016, 11:11 AM

## 2016-12-29 ENCOUNTER — Ambulatory Visit (INDEPENDENT_AMBULATORY_CARE_PROVIDER_SITE_OTHER): Payer: Medicare Other | Admitting: Internal Medicine

## 2016-12-29 DIAGNOSIS — M25572 Pain in left ankle and joints of left foot: Secondary | ICD-10-CM

## 2016-12-29 DIAGNOSIS — I872 Venous insufficiency (chronic) (peripheral): Secondary | ICD-10-CM

## 2016-12-29 DIAGNOSIS — L03116 Cellulitis of left lower limb: Secondary | ICD-10-CM | POA: Diagnosis not present

## 2016-12-29 DIAGNOSIS — Z5189 Encounter for other specified aftercare: Secondary | ICD-10-CM

## 2016-12-29 MED ORDER — DOXYCYCLINE HYCLATE 100 MG PO CAPS: 100.0000 mg | ORAL_CAPSULE | Freq: Two times a day (BID) | ORAL | 0 refills | Status: DC

## 2016-12-29 NOTE — Assessment & Plan Note (Addendum)
Patient's cellulitis does not look markedly improved after completion of antibiotic (Cefdinir 300 mg BID for a total of 7 days). Physical exam is positive for erythema, warmth, edema, and TTP. The patient is afebrile with BP of 109/60, HR 71, sating 98% on RA with no signs of sepsis.  Dr. Reesa Chew who was on the inpatient service taking care of Sharon Vasquez was in clinic and came to examine the patient's left lower extremity. In her opinion, the patient's extremity looked worse than it was on discharge. The erythema had not receded below the pen marking.   With little improvement in the patient's cellulitis Doxycycline 100 mg BID was prescribed for a total of 7 days. She has an appointment scheduled next week in clinic, which she was advised to keep. 5 day supply of Percocet was prescribed for pain. Unna boots were reapplied by orthopedic technician.

## 2016-12-29 NOTE — Progress Notes (Signed)
   CC: left leg cellulitis and pain  HPI:  [name] is a 66 y.o. female with past medical history as documented below presenting for left leg pain and cellulitis. She was admitted to the hospital on 12/21/2016 for sepsis secondary to cellulitis. She was treated with IV Vancomycin in the ED, then IV Ceftriaxone, and eventually changed to oral cefdinir. Blood cultures were negative, lower extremity dopplers ruled out DVT of the left leg. Per chart review she was having severe pain in the left leg throughout her hospitalization. She was discharged from the hospital on 12/26/2016 in stable condition, and has since completed her 7 day course of Cefdinir. She also was discharged with a 5 day supply of Percocet, but has only one pill left due to the severe pain she has been experiencing. She says she is continuing to have severe left lower leg pain, localized to the ankle. It is sharp in quality and radiates from her ankle to her inner thigh. She says the pain is exacerbated when she takes her torsemide. She does not feel improved since leaving the hospital. She denies fever, chills, chest pain, or shortness of breath.   Past Medical History:  Diagnosis Date  . A-fib ([place])   . CHF (congestive heart failure) ([place])   . COPD (chronic obstructive pulmonary disease) ([place])   . Hyperlipidemia   . Hypertension   . Morbid obesity ([place])   . PE (pulmonary embolism)    Review of Systems:   Review of Systems  Constitutional: Negative for chills and fever.  Respiratory: Negative for shortness of breath.   Cardiovascular: Negative for chest pain and palpitations.  Gastrointestinal: Negative for nausea and vomiting.    Physical Exam:  Vitals:   12/29/16 1357  BP: 109/60  Pulse: 71  Temp: 98.1 F (36.7 C)  TempSrc: Oral  SpO2: 98%  Weight: (!) 324 lb 9.6 oz (147.2 kg)  Height: 6\' 2"  (1.88 m)    Physical Exam  Constitutional: She is oriented to person, place, and time. She appears well-developed  and well-nourished. She appears distressed.  HENT:  Head: Normocephalic and atraumatic.  Cardiovascular: Normal rate, regular rhythm and normal heart sounds.   Pulmonary/Chest: Effort normal and breath sounds normal.  Musculoskeletal: She exhibits edema and tenderness.  Left lower leg: Swollen in comparison to right leg with chronic venous stasis changes. From about 2 inches below the knee the lower leg is erythematous, warm, tender to palpation, and firm.  There is a pen marking demarcating the patient's cellulitis during hospitalization. The erythema extends to the pen marking, but not beyond it.   Neurological: She is alert and oriented to person, place, and time.  Psychiatric: Her mood appears anxious.    Assessment & Plan:   See Encounters Tab for problem based charting.  Patient seen with Dr. Rebeca Alert

## 2016-12-29 NOTE — Progress Notes (Signed)
[facility] Attending  I saw and evaluated the patient.  I personally confirmed the key portions of the history and exam documented by Dr. Aggie Hacker and I reviewed pertinent patient test results.  The assessment, diagnosis, and plan were formulated together and I agree with the documentation in the resident's note.  Unfortunately, she has not seen any improvement since discharge 3 days ago and the area of warmth, erythema and tenderness remains at the border drawn while she was hospitalized. Given her lack of improvement, will add MRSA coverage with doxycycline. She should follow up next week for reevaluation.  Oda Kilts, MD

## 2016-12-29 NOTE — Progress Notes (Signed)
applied unna boots to pt left and right lower legs (bilateral).  pt tolerated application very well.

## 2016-12-29 NOTE — Patient Instructions (Signed)
Ms. Hulick,   I am sorry your leg is not better.   I have prescribed you Doxycycline 100 mg. Take 1 tablet twice daily for 7 days.   I have also prescribed another 5 days of pain medicine.  Please keep your appointment next week.

## 2016-12-30 ENCOUNTER — Other Ambulatory Visit: Payer: Self-pay | Admitting: Pharmacist

## 2016-12-30 DIAGNOSIS — I739 Peripheral vascular disease, unspecified: Secondary | ICD-10-CM

## 2016-12-30 MED ORDER — ATORVASTATIN CALCIUM 40 MG PO TABS: 40.0000 mg | ORAL_TABLET | Freq: Every day | ORAL | 3 refills | Status: DC

## 2016-12-30 NOTE — Progress Notes (Signed)
Patient's statin therapy fell off MAR 07/11/16. Considering patient's atherosclerosis and CV risk factors, and finding no overt clinical contraindications, re-started patient on high-intensity therapy. LDL 119 on 11/30/16. Notified patient and PCP.

## 2017-01-02 ENCOUNTER — Ambulatory Visit (INDEPENDENT_AMBULATORY_CARE_PROVIDER_SITE_OTHER): Payer: Medicare Other | Admitting: Internal Medicine

## 2017-01-02 ENCOUNTER — Ambulatory Visit: Payer: Medicare Other

## 2017-01-02 ENCOUNTER — Encounter: Payer: Self-pay | Admitting: Internal Medicine

## 2017-01-02 VITALS — BP 168/92 | HR 67 | Temp 97.8°F | Ht 74.0 in | Wt 316.9 lb

## 2017-01-02 DIAGNOSIS — F17210 Nicotine dependence, cigarettes, uncomplicated: Secondary | ICD-10-CM

## 2017-01-02 DIAGNOSIS — J449 Chronic obstructive pulmonary disease, unspecified: Secondary | ICD-10-CM | POA: Diagnosis not present

## 2017-01-02 DIAGNOSIS — I480 Paroxysmal atrial fibrillation: Secondary | ICD-10-CM

## 2017-01-02 DIAGNOSIS — I5030 Unspecified diastolic (congestive) heart failure: Secondary | ICD-10-CM | POA: Diagnosis not present

## 2017-01-02 DIAGNOSIS — L03116 Cellulitis of left lower limb: Secondary | ICD-10-CM

## 2017-01-02 DIAGNOSIS — I872 Venous insufficiency (chronic) (peripheral): Secondary | ICD-10-CM | POA: Diagnosis not present

## 2017-01-02 DIAGNOSIS — I739 Peripheral vascular disease, unspecified: Secondary | ICD-10-CM

## 2017-01-02 DIAGNOSIS — Z7901 Long term (current) use of anticoagulants: Secondary | ICD-10-CM | POA: Diagnosis not present

## 2017-01-02 MED ORDER — OXYCODONE-ACETAMINOPHEN 5-325 MG PO TABS: 1.0000 | ORAL_TABLET | Freq: Three times a day (TID) | ORAL | 0 refills | Status: DC | PRN

## 2017-01-02 MED ORDER — DOXYCYCLINE HYCLATE 100 MG PO CAPS: 100.0000 mg | ORAL_CAPSULE | Freq: Two times a day (BID) | ORAL | 0 refills | Status: AC

## 2017-01-02 NOTE — Progress Notes (Signed)
Bi-lateral unna boots were applied

## 2017-01-02 NOTE — Progress Notes (Signed)
[facility] Attending  Case discussed with Dr. Patel at the time of the visit.  We reviewed the resident's history and exam and pertinent patient test results.  I agree with the assessment, diagnosis, and plan of care documented in the resident's note.  

## 2017-01-02 NOTE — Patient Instructions (Signed)
It was a pleasure to see you Sharon Vasquez.  I am glad you are starting to feel better.  We will extend your antibiotics to a total of 14 days of Doxycycline 100 mg twice a day. Please complete your current antibiotics and then finish the extra 7 days (14 pills) I have prescribed today.  I am refilling your pain medicine. Take this every 8 hours only as needed for severe pain.  We will have home health come to your residence to assist with your unna boot care.  Please follow up with Dr. Heber Rome as scheduled or see [place] sooner if needed.

## 2017-01-02 NOTE — Assessment & Plan Note (Signed)
Patient's cellulitis and pain appears to be slowly improving. Initial poor response was likely to inadequate antibiotic prescription (prescribed half of intended course of Cefdinir). She does still have warmth and erythema which has receded from border markings. She is less tender to touch. She has a history of PAD and chronic venous stasis which seems to be contributing to her chronic swelling. Given her response, we will extend her Doxycycline course to a total of 14 days which should be adequate for treatment of her cellulitis. We will have the orthopedic technician rewrap her unna boot on the left leg and consult home health for unna boot care at home. Will refill her pain medicine due to significant continued pain. I did review the [place] Controlled Substances Database which is appropriate. - Continue Doxycycline 100 mg BID - extend to 14 day total course - Refilled Percocet 5-325 mg q8h prn #15 tablets - Rewrap Unna Boot of LLE; consult home health for unna boot care - f/u with PCP as scheduled

## 2017-01-02 NOTE — Progress Notes (Signed)
CC: Cellulitis  HPI:  [name] is a 66 y.o. female with PMH as listed below including HFpEF, PAF on Xarelto, chronic venous stasis, PAD, and COPD who presents for follow up management of her LLE cellulitis.   LLE Cellulitis: She was admitted from 9/20-9/25/18 for sepsis secondary to cellulitis. She was initially treated with IV antibiotics, notably receiving Vancomycin (9/20-9/24) and Ceftriaxone (9/22-9/25). Blood cultures drawn 12/21/16 were negative. She apparently responded well to ceftriaxone and she was transitioned to oral Cefdinir 300 mg BID on discharge of which she completed a 7 day course (per medication order this was prescribed as 7 capsules total?). She was also discharged on a 5 day course of Percocet for pain control. She followed up in [facility] on 9/28 for reassessment of her cellulitis. At that time, it was noted that she had not had seen any improvement of her cellulitis and the area of warmth, erythema, and tenderness remained at the borders drawn during her hospitalization. She was started on a course of Doxycycline 100 mg BID for 7 days. Her unna boots were reapplied by orthopedic technician.   Since her last visit, she feels her cellulitis is slowly improving. She still has occasional sharp pain in her left leg, 8/10 in intensity, relieved with Percocet. Gabapentin, which usually helps, has not provided relief. She has noticed that she is less tender to touch and the area of erythema is reduced from last visit. She says her left leg has been swollen and hard to touch since April of this year. Swelling does improve when she elevates her legs at home. She reports adherence to Doxycycline. She is unsure of how many pills of Cefdinir she received, but says she completed what she was given. She denies fevers, chills, chest pain, or SOB.  Past Medical History:  Diagnosis Date  . A-fib ([place])   . CHF (congestive heart failure) ([place])   . COPD (chronic obstructive pulmonary  disease) ([place])   . Hyperlipidemia   . Hypertension   . Morbid obesity ([place])   . PE (pulmonary embolism)    Review of Systems:   Review of Systems  Constitutional: Negative for chills and fever.  Respiratory: Negative for shortness of breath.   Cardiovascular: Negative for chest pain.  Skin:       Swollen, hard left leg. Redness and warmth to touch.  Neurological: Positive for tingling.     Physical Exam:  Vitals:   01/02/17 0855  BP: (!) 168/92  Pulse: 67  Temp: 97.8 F (36.6 C)  TempSrc: Oral  SpO2: 96%  Weight: (!) 316 lb 14.4 oz (143.7 kg)  Height: 6\' 2"  (1.88 m)   Physical Exam  Constitutional: She is oriented to person, place, and time. She appears well-developed and well-nourished. No distress.  HENT:  Head: Normocephalic and atraumatic.  Cardiovascular: Normal rate and regular rhythm.   DP pulse +2 left foot.  Pulmonary/Chest: Effort normal. No respiratory distress. She has no wheezes. She has no rales.  Neurological: She is alert and oriented to person, place, and time.  Skin: She is not diaphoretic.  LLE swollen, hard, and warm to touch. Erythema has improved compared to drawn borders, but remains from ankle to mid-shin. Minimal tenderness to touch. No active weeping or discharge. Onychomycotic toenails bilateral feet.     Assessment & Plan:   See Encounters Tab for problem based charting.  Patient discussed with Dr. Lynnae January  Cellulitis of left lower extremity Patient's cellulitis and pain appears to be  slowly improving. Initial poor response was likely to inadequate antibiotic prescription (prescribed half of intended course of Cefdinir). She does still have warmth and erythema which has receded from border markings. She is less tender to touch. She has a history of PAD and chronic venous stasis which seems to be contributing to her chronic swelling. Given her response, we will extend her Doxycycline course to a total of 14 days which should be adequate for  treatment of her cellulitis. We will have the orthopedic technician rewrap her unna boot on the left leg and consult home health for unna boot care at home. Will refill her pain medicine due to significant continued pain. I did review the [place] Controlled Substances Database which is appropriate. - Continue Doxycycline 100 mg BID - extend to 14 day total course - Refilled Percocet 5-325 mg q8h prn #15 tablets - Rewrap Unna Boot of LLE; consult home health for unna boot care - f/u with PCP as scheduled

## 2017-01-06 DIAGNOSIS — G8929 Other chronic pain: Secondary | ICD-10-CM | POA: Diagnosis not present

## 2017-01-06 DIAGNOSIS — I872 Venous insufficiency (chronic) (peripheral): Secondary | ICD-10-CM | POA: Diagnosis not present

## 2017-01-06 DIAGNOSIS — I110 Hypertensive heart disease with heart failure: Secondary | ICD-10-CM | POA: Diagnosis not present

## 2017-01-08 ENCOUNTER — Other Ambulatory Visit: Payer: Self-pay

## 2017-01-08 ENCOUNTER — Encounter: Payer: Self-pay | Admitting: Internal Medicine

## 2017-01-08 ENCOUNTER — Ambulatory Visit
Admission: RE | Admit: 2017-01-08 | Discharge: 2017-01-08 | Disposition: A | Discharge status: Home / Self Care | Payer: Medicare Other | Source: Ambulatory Visit | Attending: Internal Medicine | Admitting: Internal Medicine

## 2017-01-08 ENCOUNTER — Ambulatory Visit (INDEPENDENT_AMBULATORY_CARE_PROVIDER_SITE_OTHER): Payer: Medicare Other | Admitting: Internal Medicine

## 2017-01-08 VITALS — BP 158/77 | HR 67 | Temp 98.1°F | Wt 314.4 lb

## 2017-01-08 DIAGNOSIS — E661 Drug-induced obesity: Secondary | ICD-10-CM

## 2017-01-08 DIAGNOSIS — R928 Other abnormal and inconclusive findings on diagnostic imaging of breast: Secondary | ICD-10-CM

## 2017-01-08 DIAGNOSIS — L03116 Cellulitis of left lower limb: Secondary | ICD-10-CM | POA: Diagnosis not present

## 2017-01-08 DIAGNOSIS — E785 Hyperlipidemia, unspecified: Secondary | ICD-10-CM

## 2017-01-08 DIAGNOSIS — J449 Chronic obstructive pulmonary disease, unspecified: Secondary | ICD-10-CM | POA: Diagnosis not present

## 2017-01-08 DIAGNOSIS — I110 Hypertensive heart disease with heart failure: Secondary | ICD-10-CM

## 2017-01-08 DIAGNOSIS — I509 Heart failure, unspecified: Secondary | ICD-10-CM | POA: Diagnosis not present

## 2017-01-08 DIAGNOSIS — Z6841 Body Mass Index (BMI) 40.0 and over, adult: Secondary | ICD-10-CM

## 2017-01-08 DIAGNOSIS — N6002 Solitary cyst of left breast: Secondary | ICD-10-CM | POA: Insufficient documentation

## 2017-01-08 DIAGNOSIS — N6324 Unspecified lump in the left breast, lower inner quadrant: Secondary | ICD-10-CM | POA: Diagnosis not present

## 2017-01-08 DIAGNOSIS — Z86711 Personal history of pulmonary embolism: Secondary | ICD-10-CM | POA: Diagnosis not present

## 2017-01-08 HISTORY — DX: Solitary cyst of left breast: N60.02

## 2017-01-08 MED ORDER — OXYCODONE-ACETAMINOPHEN 5-325 MG PO TABS: 1.0000 | ORAL_TABLET | Freq: Three times a day (TID) | ORAL | 0 refills | Status: DC | PRN

## 2017-01-08 NOTE — Progress Notes (Signed)
   CC: Left leg pain  HPI:  [name] is a 66 y.o. female with PMH as listed below including HFpEF, PAF on Xarelto, chronic venous stasis, PAD, and COPD who presents for follow up management of her LLE cellulitis and pain.  LLE Cellulitis: She was admitted from 9/20-9/25/18 for sepsis secondary to cellulitis, discharged on oral Cefdinir. She was seen in clinic for follow up on 12/29/16 and antibiotics were changed to Doxycycline due to minimal response in erythema and pain. She was seen again on 01/02/17 and noted to have improvement of her LLE erythema and pain when compared to margins drawn during hospitalization. Her antibiotic course was extended to complete 14 total days of Doxycycline which she will complete on 01/12/17. She has been having continued pain and was prescribed Percocet 5-325 mg q8h prn which she has been taking regularly. She is requesting a refill of her pain medicine today. She had home health nursing wrap her legs over the weekend.  Past Medical History:  Diagnosis Date  . A-fib ([place])   . CHF (congestive heart failure) ([place])   . COPD (chronic obstructive pulmonary disease) ([place])   . Hyperlipidemia   . Hypertension   . Morbid obesity ([place])   . PE (pulmonary embolism)    Review of Systems:   Review of Systems  Constitutional: Negative for chills and fever.  Respiratory: Negative for shortness of breath.   Cardiovascular: Negative for chest pain.  Musculoskeletal: Negative for falls.       LLE pain, improving swelling and erythema  Neurological: Negative for loss of consciousness.     Physical Exam:  Vitals:   01/08/17 1019  BP: (!) 158/77  Pulse: 67  Temp: 98.1 F (36.7 C)  TempSrc: Oral  SpO2: 97%  Weight: (!) 314 lb 6.4 oz (142.6 kg)   Physical Exam  Constitutional: She appears well-developed. No distress.  Obese woman, using rolling walker  HENT:  Head: Normocephalic and atraumatic.  Cardiovascular: Normal rate and regular rhythm.     Pulmonary/Chest: Effort normal. No respiratory distress. She has no wheezes. She has no rales.  Musculoskeletal:  Both legs wrapped which were not removed. LLE mildly tender to palpation over dressing.   Skin: She is not diaphoretic.    Assessment & Plan:   See Encounters Tab for problem based charting.  Patient discussed with Dr. Lynnae January  Cellulitis of left lower extremity Patient had legs rewrapped 2 days ago, these were not removed today. She has home health nursing who will plan to redress this weekend. Patient states that she is having some continued improvement however is still having significant pain. She has been taking Percocet 5-325 mg every 8 hours with relief. She has continued her Doxycycline and will complete 14 total days on 01/12/17. She is requesting a refill on her pain medicine until she finishes her antibiotics. She understands that we will try to limit narcotic medications to avoid long-term use if able and work on alternate pain control methods in the future. The [place] Controlled Substance Database is reviewed and appears appropriate. - Refilled Percocet 5-325 q8h prn #15 tablets- Signed Rx handed to patient - Complete Doxycycline 100 mg BID 14 days on 01/12/17 - Continue HH nursing for dressing care - f/u with Dr. Heber [place] on 02/01/17

## 2017-01-08 NOTE — Assessment & Plan Note (Signed)
Patient had legs rewrapped 2 days ago, these were not removed today. She has home health nursing who will plan to redress this weekend. Patient states that she is having some continued improvement however is still having significant pain. She has been taking Percocet 5-325 mg every 8 hours with relief. She has continued her Doxycycline and will complete 14 total days on 01/12/17. She is requesting a refill on her pain medicine until she finishes her antibiotics. She understands that we will try to limit narcotic medications to avoid long-term use if able and work on alternate pain control methods in the future. The [place] Controlled Substance Database is reviewed and appears appropriate. - Refilled Percocet 5-325 q8h prn #15 tablets- Signed Rx handed to patient - Complete Doxycycline 100 mg BID 14 days on 01/12/17 - Continue HH nursing for dressing care - f/u with Dr. Heber Angie on 02/01/17

## 2017-01-08 NOTE — Telephone Encounter (Signed)
oxyCODONE-acetaminophen (PERCOCET/ROXICET) 5-325 MG tablet, refill request.  

## 2017-01-08 NOTE — Patient Instructions (Signed)
Please complete your antibiotics and wound care. I have refilled a short course of pain medicine, we will try to avoid these medicines as long-term use.  Follow up with Dr. Heber Ogilvie as scheduled.

## 2017-01-09 ENCOUNTER — Ambulatory Visit: Payer: Medicare Other

## 2017-01-09 DIAGNOSIS — I872 Venous insufficiency (chronic) (peripheral): Secondary | ICD-10-CM | POA: Diagnosis not present

## 2017-01-09 DIAGNOSIS — G8929 Other chronic pain: Secondary | ICD-10-CM | POA: Diagnosis not present

## 2017-01-09 DIAGNOSIS — I110 Hypertensive heart disease with heart failure: Secondary | ICD-10-CM | POA: Diagnosis not present

## 2017-01-10 ENCOUNTER — Telehealth: Payer: Self-pay

## 2017-01-10 NOTE — Telephone Encounter (Signed)
Faxed [employer] form 01/10/2017.

## 2017-01-11 ENCOUNTER — Telehealth: Payer: Self-pay | Admitting: *Deleted

## 2017-01-11 NOTE — Progress Notes (Signed)
[facility] Attending  Case discussed with Dr. Patel at the time of the visit.  We reviewed the resident's history and exam and pertinent patient test results.  I agree with the assessment, diagnosis, and plan of care documented in the resident's note.  

## 2017-01-11 NOTE — Telephone Encounter (Signed)
Agree 

## 2017-01-11 NOTE — Telephone Encounter (Signed)
[facility] requesting skilled nursing 1/wk for 9 wks.  V.O given, do you agree? Thanks!

## 2017-01-12 DIAGNOSIS — I110 Hypertensive heart disease with heart failure: Secondary | ICD-10-CM | POA: Diagnosis not present

## 2017-01-12 DIAGNOSIS — L97901 Non-pressure chronic ulcer of unspecified part of unspecified lower leg limited to breakdown of skin: Secondary | ICD-10-CM | POA: Diagnosis not present

## 2017-01-12 DIAGNOSIS — I872 Venous insufficiency (chronic) (peripheral): Secondary | ICD-10-CM | POA: Diagnosis not present

## 2017-01-12 DIAGNOSIS — G8929 Other chronic pain: Secondary | ICD-10-CM | POA: Diagnosis not present

## 2017-01-17 DIAGNOSIS — I110 Hypertensive heart disease with heart failure: Secondary | ICD-10-CM | POA: Diagnosis not present

## 2017-01-17 DIAGNOSIS — G8929 Other chronic pain: Secondary | ICD-10-CM | POA: Diagnosis not present

## 2017-01-17 DIAGNOSIS — I872 Venous insufficiency (chronic) (peripheral): Secondary | ICD-10-CM | POA: Diagnosis not present

## 2017-01-22 ENCOUNTER — Telehealth: Payer: Self-pay

## 2017-01-22 NOTE — Telephone Encounter (Signed)
Faxed [facility] certification and plan for 01/06/2017 to 03/06/2017 @ [phone].

## 2017-01-23 NOTE — Progress Notes (Signed)
Patient states she is doing well with medications overall, no further assistance needed.

## 2017-01-24 ENCOUNTER — Other Ambulatory Visit: Payer: Self-pay | Admitting: Pharmacist

## 2017-01-24 DIAGNOSIS — J449 Chronic obstructive pulmonary disease, unspecified: Secondary | ICD-10-CM

## 2017-01-24 MED ORDER — FLUTICASONE-SALMETEROL 250-50 MCG/DOSE IN AEPB: 1.0000 | INHALATION_SPRAY | Freq: Two times a day (BID) | RESPIRATORY_TRACT | 11 refills | Status: DC

## 2017-01-24 NOTE — Progress Notes (Signed)
Contacted patient for follow up medication help. Patient states she is doing well with her medications. She does state her breathing symptoms are the same (no better but no worse) for the past month. Discussed with PCP and changed Advair from 100-50 mcg to 250-50 mcg.

## 2017-01-25 DIAGNOSIS — I110 Hypertensive heart disease with heart failure: Secondary | ICD-10-CM | POA: Diagnosis not present

## 2017-01-25 DIAGNOSIS — G8929 Other chronic pain: Secondary | ICD-10-CM | POA: Diagnosis not present

## 2017-01-25 DIAGNOSIS — I872 Venous insufficiency (chronic) (peripheral): Secondary | ICD-10-CM | POA: Diagnosis not present

## 2017-01-28 ENCOUNTER — Other Ambulatory Visit: Payer: Self-pay | Admitting: Physician Assistant

## 2017-01-28 DIAGNOSIS — I4819 Other persistent atrial fibrillation: Secondary | ICD-10-CM

## 2017-01-29 ENCOUNTER — Ambulatory Visit (INDEPENDENT_AMBULATORY_CARE_PROVIDER_SITE_OTHER): Payer: Medicare Other | Admitting: Internal Medicine

## 2017-01-29 VITALS — BP 155/93 | HR 72 | Temp 97.9°F | Ht 74.0 in | Wt 320.3 lb

## 2017-01-29 DIAGNOSIS — I4891 Unspecified atrial fibrillation: Secondary | ICD-10-CM

## 2017-01-29 DIAGNOSIS — E785 Hyperlipidemia, unspecified: Secondary | ICD-10-CM

## 2017-01-29 DIAGNOSIS — N281 Cyst of kidney, acquired: Secondary | ICD-10-CM | POA: Diagnosis not present

## 2017-01-29 DIAGNOSIS — L03116 Cellulitis of left lower limb: Secondary | ICD-10-CM

## 2017-01-29 DIAGNOSIS — Z95820 Peripheral vascular angioplasty status with implants and grafts: Secondary | ICD-10-CM

## 2017-01-29 DIAGNOSIS — M79605 Pain in left leg: Secondary | ICD-10-CM

## 2017-01-29 DIAGNOSIS — K769 Liver disease, unspecified: Secondary | ICD-10-CM

## 2017-01-29 DIAGNOSIS — M7989 Other specified soft tissue disorders: Secondary | ICD-10-CM | POA: Diagnosis not present

## 2017-01-29 DIAGNOSIS — I872 Venous insufficiency (chronic) (peripheral): Secondary | ICD-10-CM

## 2017-01-29 DIAGNOSIS — I509 Heart failure, unspecified: Secondary | ICD-10-CM | POA: Diagnosis not present

## 2017-01-29 DIAGNOSIS — I110 Hypertensive heart disease with heart failure: Secondary | ICD-10-CM

## 2017-01-29 DIAGNOSIS — Z79899 Other long term (current) drug therapy: Secondary | ICD-10-CM

## 2017-01-29 DIAGNOSIS — F17210 Nicotine dependence, cigarettes, uncomplicated: Secondary | ICD-10-CM

## 2017-01-29 DIAGNOSIS — I10 Essential (primary) hypertension: Secondary | ICD-10-CM

## 2017-01-29 DIAGNOSIS — J449 Chronic obstructive pulmonary disease, unspecified: Secondary | ICD-10-CM | POA: Diagnosis not present

## 2017-01-29 DIAGNOSIS — I2699 Other pulmonary embolism without acute cor pulmonale: Secondary | ICD-10-CM

## 2017-01-29 DIAGNOSIS — Z7901 Long term (current) use of anticoagulants: Secondary | ICD-10-CM

## 2017-01-29 DIAGNOSIS — L814 Other melanin hyperpigmentation: Secondary | ICD-10-CM

## 2017-01-29 DIAGNOSIS — Z7982 Long term (current) use of aspirin: Secondary | ICD-10-CM

## 2017-01-29 DIAGNOSIS — R531 Weakness: Secondary | ICD-10-CM

## 2017-01-29 DIAGNOSIS — I739 Peripheral vascular disease, unspecified: Secondary | ICD-10-CM

## 2017-01-29 MED ORDER — ONDANSETRON HCL 4 MG PO TABS: 4.0000 mg | ORAL_TABLET | Freq: Three times a day (TID) | ORAL | 0 refills | Status: DC | PRN

## 2017-01-29 MED ORDER — OXYCODONE-ACETAMINOPHEN 5-325 MG PO TABS: 1.0000 | ORAL_TABLET | Freq: Two times a day (BID) | ORAL | 0 refills | Status: DC | PRN

## 2017-01-29 NOTE — Telephone Encounter (Signed)
Please review for refill thanks 

## 2017-01-29 NOTE — Patient Instructions (Addendum)
It was a pleasure to see you today Sharon Vasquez. Please make the following changes:  -Please continue use of aspirin -Please continue use of rivaroxaban -Percocet 5-325 prescribed for you to use as needed when you have pain twice a day -please continue to use your compression stockings  If you have any questions or concerns, please call our clinic at [phone] between 9am-5pm and after hours call [phone] and ask for the internal medicine resident on call. If you feel you are having a medical emergency please call 911.   Thank you, we look forward to help you remain healthy!  Lars Mage, MD Internal Medicine PGY1

## 2017-01-29 NOTE — Telephone Encounter (Signed)
REFILL 

## 2017-01-29 NOTE — Progress Notes (Signed)
   CC: Left leg pain  HPI:  Sharon Vasquez is a 66 y.o. female with past medical history of a A. fib, CHF, COPD, hyperlipidemia, hypertension, pulmonary embolism presented with presents with left leg pain. Please see problem based charting for evaluation, assessment, and plan.  Past Medical History:  Diagnosis Date  . A-fib ([place])   . Benign breast cyst in female, left 01/08/2017   Found by Screening mammogram, evaluated by U/S on 01/08/17 and determined to be a benign simple breast cyst.  . CHF (congestive heart failure) ([place])   . COPD (chronic obstructive pulmonary disease) ([place])   . Hyperlipidemia   . Hypertension   . Morbid obesity ([place])   . PE (pulmonary embolism)    Review of Systems:    Review of Systems  Musculoskeletal:       Left leg pain, swelling in bilateral lower extremities  Neurological: Positive for sensory change and focal weakness.  per hpi  Physical Exam:  Vitals:   01/29/17 1434  BP: (!) 155/93  Pulse: 72  Temp: 97.9 F (36.6 C)  TempSrc: Oral  SpO2: 98%  Weight: (!) 320 lb 4.8 oz (145.3 kg)  Height: 6\' 2"  (1.88 m)   Physical Exam  Constitutional: She appears well-developed and well-nourished. No distress.  HENT:  Head: Normocephalic and atraumatic.  Eyes: Conjunctivae are normal.  Cardiovascular: Normal rate, regular rhythm, normal heart sounds and intact distal pulses.   Pulmonary/Chest: Effort normal and breath sounds normal. No respiratory distress. She has no wheezes.  Abdominal: Soft. Bowel sounds are normal. She exhibits no distension. There is no tenderness.  Musculoskeletal: She exhibits edema.  Proximal portion of left calf was firm and woody to palpation   Skin: There is erythema.  Hyperpigmentation noted in bilateral lower extremities. Swelling of bilateral lower extremities.   Psychiatric: She has a normal mood and affect. Her behavior is normal. Judgment and thought content normal.    Assessment & Plan:   See Encounters Tab  for problem based charting.  Patient seen with Dr. Eppie Gibson

## 2017-01-30 ENCOUNTER — Other Ambulatory Visit: Payer: Self-pay | Admitting: Internal Medicine

## 2017-01-30 ENCOUNTER — Telehealth: Payer: Self-pay | Admitting: Internal Medicine

## 2017-01-30 NOTE — Assessment & Plan Note (Addendum)
Patient has had left lower extremity pain since June 2018.  She was admitted from 09/04/16 to 09/07/16 with left lower extremity cellulitis.  Patient has received Ceftinir 300 mg twice daily for 7 days and doxycycline 100 mg twice daily for 14 days.  Patient completed her course of doxycycline on 01/12/2017.  She received Percocet 5-335 q8hrs prn 15 tablets at previous visit however she was informed that further narcotic medications will try to be avoided. However, the patient states that she continues to be in extreme pain.   The patient states that she has left leg pain that starts inferior to her knee and radiates down the leg around her left ankle. The pain is throbbing and sharp in nature. The pain is 10/10 intensity.  The patient has angioplasty in her left common and extrenal iliac veins for severe venous insufficiency in 01/2016. On exam, today (01/29/17) the patient's bilateral lower extremities are hyperpigmented from ankle to mid calf. The patient's left lower extremity is slightly more swollen and hyperpigmented. The patient's left calf is also noted to be very firm and woody in nature. I am concerned that the patient's venous insufficiency has progressed significantly.    Left lower extremity Doppler done 12/23/2016 showed multiple enlarged lymph nodes in left groin, no obvious evidence of DVT, no evidence of Baker's cyst.  ABI ordered 11/30/2016 showed left ABI = 1.24, right ABI = 1.16.  Patient states further testing was ordered and is pending.  However, I spoke to the patient's PCP Dr. Heber [place] and he states that no testing is pending at this time.  The patient needs further care for her chronic venous insufficiency.  She has not been seen by vascular surgery since March 2018 after her move from [place].  I am concerned that she has lipodermatosclerosis.  I would like to consider Compro flex compression, compression stockings, vascular surgery referral.  -Recommended patient to use compression  stockings  -Percocet 5-325 bid prn prescribed -Recommended continuing aspirin and rivaroxaban -Vascular surgery referral  The patient was managed for lymphedema at [facility]. The patient's treatment plan at [facility] was chart reviewed is summarized below:   MRV (08/31/15): -The left common iliac vein is diminutive in size without evidence for a filling defect. No definite evidence for DVT in the visualized vessels.    -Right renal cyst.   -There is a 2.5 cm T2 hyperintense lesion in the right lobe of the liver which demonstrate delayed hyperenhancement. This is likely a hemangioma. However, it is incompletely characterized. Dedicated CT or MRI maybe performed as clinically warranted.   Bilateral US Duplex Venous Lower Extremity (08/03/15): -There is no sonographic evidence of acute deep vein thrombosis in the right lower extremity.  All major deep veins are patent and compressible.  -There is no sonographic evidence of acute deep vein thrombosis in the left lower extremity. All major deep veins are patent and compressible.  Left pelvic venogram with intravascular ultrasound and angioplasty/stenting (01/17/16): -a 14 mm XXL balloon was utilized to serially dilate the high grade stenoses/occlusions involving the left common iliac and external iliac vein. -a 18 mm x 90 mm Wallstent was placed at the left common and external iliac veins -Left iliocaval stent placed   Bilateral US Duplex Venous Lower Extremity (01/19/16): S/p left CIV stent and unna boot -There is no sonographic evidence of acute deep vein thrombosis in the left lower extremity. All major deep veins are patent and compressible. The left common femoral vein is patent and  compressible. The femoral vein is patent and compressible. -There is no sonographic evidence of acute deep vein thrombosis in the right lower extremity.  All major deep veins are patent and compressible.  Bilateral US Duplex Venous Lower Extremity  (06/19/16): -There is no sonographic evidence of acute deep vein thrombosis in the right lower extremity.   All major deep veins are patent and compressible. The GSV is incompetent and was examined with the patient in the upright position. Distal   augmentation maneuvers resulted in retrograde flow for a time duration of greater than 500   milliseconds.  -There is no sonographic evidence of acute deep vein thrombosis in the left lower extremity. All  major deep veins are patent and compressible.    The left great saphenous vein and the small saphenous vein are negative for reflux and   thrombosis.   Other therapies:  -The patient was attempted to be placed on a flexitouch device to deliver sequential compression treatment for lympedema but could not obtain approval.  -unna boot applied 01/12/16, 01/19/16, 07/07/16 -compreflex compression prescribed long term (07/07/16) -Plan to revisit endovenous ablation of R GSV per vascular surgeon on 07/07/16 visit

## 2017-01-30 NOTE — Telephone Encounter (Signed)
Patient would like to know why does the physician order Ondansetron she picked up at the pharmacy, pls call patient

## 2017-01-30 NOTE — Telephone Encounter (Signed)
Called and informed the patient that Zofran was not supposed to be dispensed by the pharmacy. Zofran was ordered and immediately cancelled. The patient was informed to not use zofran and to return the medication to clinic. The patient was also informed that her treatment plan was discussed with her pcp and that she will be referred to vascular surgery.

## 2017-01-30 NOTE — Assessment & Plan Note (Signed)
Patient's blood pressure during this visit was 155/90 repeat blood pressure measurement was 172/108.  She is currently on benazepril 20 mg daily.  Patient is likely to have elevated blood pressure due to her acute pain. -Reassess blood pressure at follow-up visit

## 2017-01-31 DIAGNOSIS — I872 Venous insufficiency (chronic) (peripheral): Secondary | ICD-10-CM | POA: Diagnosis not present

## 2017-01-31 DIAGNOSIS — G8929 Other chronic pain: Secondary | ICD-10-CM | POA: Diagnosis not present

## 2017-01-31 DIAGNOSIS — I110 Hypertensive heart disease with heart failure: Secondary | ICD-10-CM | POA: Diagnosis not present

## 2017-01-31 NOTE — Telephone Encounter (Signed)
Pt has an appt scheduled with her pcp on tomorrow and is aware of the appt.  Pt told we can discuss whatever questions or concerns she may have about the medication at that time. Regenia Skeeter, Darlene Cassady10/31/20182:56 PM

## 2017-01-31 NOTE — Telephone Encounter (Signed)
Pt requesting to speak with a nurse about refund on one of the med she pick up.

## 2017-01-31 NOTE — Progress Notes (Signed)
[facility] Subjective:  HPI: [name] is a 66 y.o. female who presents for follow up of chronic venous insufficiency.  Please see Assessment and Plan below for the status of her chronic medical problems.  Review of Systems: Review of Systems  Cardiovascular: Positive for leg swelling.  Skin: Negative for rash.  Neurological: Negative for dizziness.  Psychiatric/Behavioral: Negative for depression and substance abuse.    Depression screen [facility] 2/9 01/29/2017 01/08/2017 01/02/2017 11/30/2016 11/09/2016  Decreased Interest 3 0 0 0 0  Down, Depressed, Hopeless 3 0 0 0 0  PHQ - 2 Score 6 0 0 0 0  Altered sleeping 3 - - - -  Tired, decreased energy 3 - - - -  Change in appetite 3 - - - -  Feeling bad or failure about yourself  3 - - - -  Trouble concentrating 2 - - - -  Moving slowly or fidgety/restless 3 - - - -  Suicidal thoughts 0 - - - -  PHQ-9 Score 23 - - - -  Difficult doing work/chores Not difficult at all - - - -    Objective:  Physical Exam: Vitals:   02/01/17 0818  BP: 137/79  Pulse: 66  Temp: 98.3 F (36.8 C)  SpO2: 94%  Weight: (!) 315 lb (142.9 kg)  Physical Exam  Constitutional: She is well-developed, well-nourished, and in no distress.  Cardiovascular: Normal rate and regular rhythm.  Pulmonary/Chest: Effort normal and breath sounds normal.  Abdominal: Soft. Bowel sounds are normal.  Musculoskeletal:  Bandage wraps in place over bilateral LE  Nursing note and vitals reviewed.  Assessment & Plan:  Peripheral vascular disease ([place]) HPI: She has a chronic history of venous insufficiency due to venous vascular disease.  She was recently in the hospital and diagnosed with a prolonged course of cellulitis that was slow to respond to antibiotics.  During this course she was apparently prescribed Percocet.  Here today requesting a new refill of her Percocet.  He reports that Percocet helps with her pain and that nothing else works.  She  notes that the pain is in her legs it is constant.  She has been given limited prescriptions for Percocet for a few pills at a time she is not very clear to me as far as how often she takes this.  He has told me that she has stopped taking naproxen for pain and that she is told she will bleed to death if she takes this.  In reviewing numerous other medications for pain: She will not take tramadol she has been told that this is not a good medication by a "pharmacist ."  I also attempted to discuss with her that I thought she was depressed from her PHQ 9 score and that Cymbalta may be a good medication to treat both pain and depression.  However she reports that Cymbalta does not work.  I attempted to ask her about Voltaren gel and she reports that she "has tried all the creams and none of them work".  Attempted to bring up other medications she would cut me off and said that does not work she was very clear that she only wanted Percocet.  Assessment and plan She was seen 2 days ago by Dr. Maricela Bo in our office, who is done excellent job in [place] her outside records.  I discussed with Dr. Maricela Bo due to think referral to vascular surgery is the best option. And this referral was placed.  As far as her pain it was very difficult for me to clarify exactly what was causing her pain and the treatment effects from Percocet.  Far as I am concerned I do not think that this is pain due to cellulitis and is a chronic pain I do not know that opioid medications have any role in the treatment of this pain.  Discussed with her the only thing I think I could offer her as a referral to a pain specialist. I did provide her with 5 pills of oxycodone 5-325 for very severe pain.  Primary osteoarthritis of both knees HPI: Patient reports that she was recently prescribed Percocet for her "cellulitis ", he notes that this is helped with some bilateral leg pain as well as bilateral knee pain.  She notes that she has a history of  osteoarthritis in both knees and has been told that she needs a joint replacement however she notes that in [place] her physicians always rounded and groups and that the joint discussion between her groups was that she had too many medical problems to undergo joint replacement.  Previously had her on some naproxen however she has decided to stop taking this as she has been told that she will bleed to death because she is also on a blood thinner.  She also feels that the Percocet helps with her knee pain.  He has not had any corticosteroids or other injections into her knees. Assessment and plan I do suspect that she has osteoarthritis of both knees.  I told her that I agree with her other physicians that she has more pressing problems than a knee replacement right now.  She also is not very mobile and she currently uses a wheelchair for most of her movement.  I discussed that we should focus on having her see a vascular surgeon for her lower extremity venous insuffiency.  Major depression, recurrent, chronic (HCC) HPI: PHQ 2 screening was positive and we went to a PHQ 9 this was significant for a score of 23 which would indicate major depression.  I am concerned that depression is increasing her pain as well as contributing to a lower quality of life.  I attempted to discuss this with her however she was very resistant to discussing any medication changes or other medical problems besides a Percocet refill.  Assessment and plan Major depression -We will follow this I do think she would benefit from an SSRI and likely referral to psychology/psychiatry services   Medications Ordered Meds ordered this encounter  Medications  . DISCONTD: oxyCODONE-acetaminophen (PERCOCET/ROXICET) 5-325 MG tablet    Sig: Take 1 tablet by mouth 2 (two) times daily as needed for severe pain.    Dispense:  5 tablet    Refill:  0  . oxyCODONE-acetaminophen (PERCOCET/ROXICET) 5-325 MG tablet    Sig: Take 1 tablet by mouth  2 (two) times daily as needed for severe pain.    Dispense:  5 tablet    Refill:  0   Other Orders Orders Placed This Encounter  Procedures  . Pneumococcal conjugate vaccine 13-valent IM  . Ambulatory referral to Pain Clinic    Referral Priority:   Routine    Referral Type:   Consultation    Referral Reason:   Specialty Services Required    Requested Specialty:   Pain Medicine    Number of Visits Requested:   1   Follow Up: Return in about 2 months (around 04/03/2017).

## 2017-01-31 NOTE — Telephone Encounter (Signed)
I believe the [name] spoke with pharmacy regarding this and she was not charged for the medication. Pharmacy faxed [place] the receipt. Can we please follow up on this. Thank you!

## 2017-01-31 NOTE — Telephone Encounter (Signed)
Pt calls and states she picked up at pharmacy and paid for ondansetron(zofran) at [place]- states the doctor called and told her that it should not have been filled, to bring it to clinic and clinic would reimburse her $29.00, triage called pharmacy, pharmacist states she picked it up for zero dollars and will fax record to verify 0 dollars. Will provide to dr Maricela Bo

## 2017-02-01 ENCOUNTER — Ambulatory Visit (INDEPENDENT_AMBULATORY_CARE_PROVIDER_SITE_OTHER): Payer: Medicare Other | Admitting: Internal Medicine

## 2017-02-01 VITALS — BP 137/79 | HR 66 | Temp 98.3°F | Wt 315.0 lb

## 2017-02-01 DIAGNOSIS — Z23 Encounter for immunization: Secondary | ICD-10-CM

## 2017-02-01 DIAGNOSIS — Z79891 Long term (current) use of opiate analgesic: Secondary | ICD-10-CM

## 2017-02-01 DIAGNOSIS — M170 Bilateral primary osteoarthritis of knee: Secondary | ICD-10-CM | POA: Diagnosis not present

## 2017-02-01 DIAGNOSIS — I872 Venous insufficiency (chronic) (peripheral): Secondary | ICD-10-CM | POA: Diagnosis not present

## 2017-02-01 DIAGNOSIS — F339 Major depressive disorder, recurrent, unspecified: Secondary | ICD-10-CM

## 2017-02-01 DIAGNOSIS — I739 Peripheral vascular disease, unspecified: Secondary | ICD-10-CM | POA: Diagnosis not present

## 2017-02-01 MED ORDER — OXYCODONE-ACETAMINOPHEN 5-325 MG PO TABS: 1.0000 | ORAL_TABLET | Freq: Two times a day (BID) | ORAL | 0 refills | Status: DC | PRN

## 2017-02-01 MED ORDER — OXYCODONE-ACETAMINOPHEN 5-325 MG PO TABS: 1.0000 | ORAL_TABLET | Freq: Two times a day (BID) | ORAL | 0 refills | Status: AC | PRN

## 2017-02-01 NOTE — Patient Instructions (Signed)
I would like you to return the ondansetron prescription to [facility] for disposal. I have written a very limited prescription for Oxycodone-Acetaminophen, I want you to only use this for severe pain.  I am placing a referral for pain management.  Overall I think our main goal is to get you to our vascular surgeons.

## 2017-02-05 NOTE — Assessment & Plan Note (Signed)
HPI: Patient reports that she was recently prescribed Percocet for her "cellulitis ", he notes that this is helped with some bilateral leg pain as well as bilateral knee pain.  She notes that she has a history of osteoarthritis in both knees and has been told that she needs a joint replacement however she notes that in [place] her physicians always rounded and groups and that the joint discussion between her groups was that she had too many medical problems to undergo joint replacement.  Previously had her on some naproxen however she has decided to stop taking this as she has been told that she will bleed to death because she is also on a blood thinner.  She also feels that the Percocet helps with her knee pain.  He has not had any corticosteroids or other injections into her knees. Assessment and plan I do suspect that she has osteoarthritis of both knees.  I told her that I agree with her other physicians that she has more pressing problems than a knee replacement right now.  She also is not very mobile and she currently uses a wheelchair for most of her movement.  I discussed that we should focus on having her see a vascular surgeon for her lower extremity venous insuffiency.

## 2017-02-05 NOTE — Assessment & Plan Note (Signed)
HPI: PHQ 2 screening was positive and we went to a PHQ 9 this was significant for a score of 23 which would indicate major depression.  I am concerned that depression is increasing her pain as well as contributing to a lower quality of life.  I attempted to discuss this with her however she was very resistant to discussing any medication changes or other medical problems besides a Percocet refill.  Assessment and plan Major depression -We will follow this I do think she would benefit from an SSRI and likely referral to psychology/psychiatry services

## 2017-02-05 NOTE — Progress Notes (Signed)
I saw and evaluated the patient.  I personally confirmed the key portions of Dr. Chundi's history and exam and reviewed pertinent patient test results.  The assessment, diagnosis, and plan were formulated together and I agree with the documentation in the resident's note. 

## 2017-02-05 NOTE — Assessment & Plan Note (Signed)
HPI: She has a chronic history of venous insufficiency due to venous vascular disease.  She was recently in the hospital and diagnosed with a prolonged course of cellulitis that was slow to respond to antibiotics.  During this course she was apparently prescribed Percocet.  Here today requesting a new refill of her Percocet.  He reports that Percocet helps with her pain and that nothing else works.  She notes that the pain is in her legs it is constant.  She has been given limited prescriptions for Percocet for a few pills at a time she is not very clear to me as far as how often she takes this.  He has told me that she has stopped taking naproxen for pain and that she is told she will bleed to death if she takes this.  In reviewing numerous other medications for pain: She will not take tramadol she has been told that this is not a good medication by a "pharmacist ."  I also attempted to discuss with her that I thought she was depressed from her PHQ 9 score and that Cymbalta may be a good medication to treat both pain and depression.  However she reports that Cymbalta does not work.  I attempted to ask her about Voltaren gel and she reports that she "has tried all the creams and none of them work".  Attempted to bring up other medications she would cut me off and said that does not work she was very clear that she only wanted Percocet.  Assessment and plan She was seen 2 days ago by Dr. Maricela Bo in our office, who is done excellent job in [place] her outside records.  I discussed with Dr. Maricela Bo due to think referral to vascular surgery is the best option. And this referral was placed.  As far as her pain it was very difficult for me to clarify exactly what was causing her pain and the treatment effects from Percocet.  Far as I am concerned I do not think that this is pain due to cellulitis and is a chronic pain I do not know that opioid medications have any role in the treatment of this pain.  Discussed with  her the only thing I think I could offer her as a referral to a pain specialist. I did provide her with 5 pills of oxycodone 5-325 for very severe pain.

## 2017-02-06 ENCOUNTER — Other Ambulatory Visit: Payer: Self-pay | Admitting: *Deleted

## 2017-02-06 MED ORDER — ESOMEPRAZOLE MAGNESIUM 40 MG PO CPDR: 40.0000 mg | DELAYED_RELEASE_CAPSULE | Freq: Every day | ORAL | 2 refills | Status: DC | PRN

## 2017-02-08 NOTE — Addendum Note (Signed)
Addended by: Truddie Crumble on: 02/08/2017 08:59 AM   Modules accepted: Orders

## 2017-02-12 ENCOUNTER — Other Ambulatory Visit: Payer: Self-pay

## 2017-02-12 DIAGNOSIS — I872 Venous insufficiency (chronic) (peripheral): Secondary | ICD-10-CM

## 2017-02-20 ENCOUNTER — Other Ambulatory Visit: Payer: Self-pay

## 2017-02-20 MED ORDER — ALBUTEROL SULFATE (2.5 MG/3ML) 0.083% IN NEBU: 2.5000 mg | INHALATION_SOLUTION | RESPIRATORY_TRACT | 0 refills | Status: DC | PRN

## 2017-02-20 NOTE — Telephone Encounter (Signed)
albuterol (PROVENTIL) (2.5 MG/3ML) 0.083% nebulizer solution(Expired), refill request @ [facility] on [address] in [place].

## 2017-02-20 NOTE — Telephone Encounter (Signed)
Pt currently out of town-requests refill on albuterol neb solution. Will be going on a cruise tomorrow and would like to pick up rx today.  Of note, pt wishes to have rx sent to [facility] in [place] for a one-time only fill and then have pharmacy removed from chart.  Will send request to pcp.Regenia Skeeter, Darlene Cassady11/20/201811:08 AM

## 2017-03-06 DIAGNOSIS — I872 Venous insufficiency (chronic) (peripheral): Secondary | ICD-10-CM | POA: Diagnosis not present

## 2017-03-06 DIAGNOSIS — G8929 Other chronic pain: Secondary | ICD-10-CM | POA: Diagnosis not present

## 2017-03-06 DIAGNOSIS — I110 Hypertensive heart disease with heart failure: Secondary | ICD-10-CM | POA: Diagnosis not present

## 2017-03-07 ENCOUNTER — Telehealth: Payer: Self-pay | Admitting: Internal Medicine

## 2017-03-07 NOTE — Telephone Encounter (Signed)
Patient would like a call back in reference to her Legs being Wrapped as HH will not be able to continue to wrap her legs without an open wound.

## 2017-03-08 NOTE — Telephone Encounter (Signed)
OK thank you helen

## 2017-03-08 NOTE — Telephone Encounter (Signed)
Spoke w/ pt, she refuses appt today or tomorrow am, she will come to [facility] 12/7 at 1530, states legs are swollen from toes to top of thighs, denies shortness of breath, weakness, N&V, does state she had a fall last pm. She is advised that today appt would be in her best interest but she cont to refuse, advised if condition declines to call 911 and come to ED. She is agreeable

## 2017-03-08 NOTE — Telephone Encounter (Signed)
Have called twice got the same recording twice, lm for rtc

## 2017-03-09 ENCOUNTER — Encounter: Payer: Self-pay | Admitting: Internal Medicine

## 2017-03-09 ENCOUNTER — Other Ambulatory Visit: Payer: Self-pay

## 2017-03-09 ENCOUNTER — Ambulatory Visit (INDEPENDENT_AMBULATORY_CARE_PROVIDER_SITE_OTHER): Payer: Medicare Other | Admitting: Internal Medicine

## 2017-03-09 VITALS — BP 157/86 | HR 74 | Temp 97.6°F | Ht 74.0 in | Wt 340.7 lb

## 2017-03-09 DIAGNOSIS — Z79891 Long term (current) use of opiate analgesic: Secondary | ICD-10-CM | POA: Diagnosis not present

## 2017-03-09 DIAGNOSIS — I739 Peripheral vascular disease, unspecified: Secondary | ICD-10-CM | POA: Diagnosis not present

## 2017-03-09 DIAGNOSIS — I872 Venous insufficiency (chronic) (peripheral): Secondary | ICD-10-CM | POA: Diagnosis not present

## 2017-03-09 DIAGNOSIS — M25552 Pain in left hip: Secondary | ICD-10-CM | POA: Insufficient documentation

## 2017-03-09 MED ORDER — OXYCODONE-ACETAMINOPHEN 5-325 MG PO TABS
1.0000 | ORAL_TABLET | Freq: Two times a day (BID) | ORAL | 0 refills | Status: AC | PRN
Start: 2017-03-09 — End: 2017-03-14

## 2017-03-09 NOTE — Progress Notes (Signed)
   CC: Hip pain, Chronic Venous Stasis, Peripheral Vascular disease  HPI:  [name] is a 66 y.o. F with PMHx listed below presenting for Hip pain, Chronic Venous Stasis, Peripheral Vascular disease. Please see the A&P for the status of the patient's chronic medical problems.  Patient states that she has been experiencing left sided hip pain for the past 4 days. She described the pain as a 10/10, achy (occasionally sharp), pain in her left lateral hip and thigh that does radiate towards her back. She states the pain is worse with sitting and walking. She was able to get pain relief when she took her percocet, which she has been prescribed from chronic lower extremity pain in the past.   Past Medical History:  Diagnosis Date  . A-fib ([place])   . Benign breast cyst in female, left 01/08/2017   Found by Screening mammogram, evaluated by U/S on 01/08/17 and determined to be a benign simple breast cyst.  . CHF (congestive heart failure) ([place])   . COPD (chronic obstructive pulmonary disease) ([place])   . Hyperlipidemia   . Hypertension   . Morbid obesity ([place])   . PE (pulmonary embolism)    Review of Systems:  Performed and negative except as otherwise indicated.  Physical Exam:  Vitals:   03/09/17 1513  BP: (!) 157/86  Pulse: 74  Temp: 97.6 F (36.4 C)  TempSrc: Oral  SpO2: 95%  Weight: (!) 340 lb 11.2 oz (154.5 kg)  Height: 6\' 2"  (1.88 m)   Physical Exam  Cardiovascular: Normal rate, regular rhythm and normal heart sounds.  Pulmonary/Chest: Effort normal and breath sounds normal. No respiratory distress.  Abdominal: Soft. Bowel sounds are normal. There is no tenderness.  Obese Abdomen  Musculoskeletal: She exhibits edema and tenderness.  L lateral hip tender to palpation over greater trochanter 3+ Pitting edema Bilat LE's L>R Skin changes 3/4 way up bilateral shins No wounds present at this time Legs tender to palpation  Skin: Skin is warm.  Venous stasis changes  bilateral legs      Assessment & Plan:   See Encounters Tab for problem based charting.  Patient seen with Dr. Evette Doffing

## 2017-03-09 NOTE — Patient Instructions (Addendum)
Thank you for allowing [place] to care for you.  For your leg swelling and pain: - Continue leg compression - Follow up with vascular surgery as scheduled on 12/19 - We will look in to your referral to the pain medicine specialist - We provided you with 5 days of Percocet 5-325 for breakthrough pain  For your hip pain: - We have ordered x-rays to be taken of your hip - Please continue to take naproxen as need  Please follow up with your PCP as sceduled

## 2017-03-09 NOTE — Assessment & Plan Note (Addendum)
Patient with a history of venous insufficiency who has been complaining of significant pain in her lower extremities and has received percocet in the past starting during a hospitalization for cellulitis as detailed in note on 02/05/17. She continues to state that the percocet is the only medication she feels gives her relief from pain. She has been referred to a pain specialist for further characterization and management of her pain, but this referral remains pending after one month. I will provide 5 days of percocet to help her get through pain flairs until her up coming specialist's appointments with vascular surgery on 03/21/17 and the pain specialist (not scheduled yet). Addendum ([place] Prescriber database was reviewed and was appropriate) - oxycodone-acetaminophen 5-325 q12h PRN severe pain, #10

## 2017-03-09 NOTE — Assessment & Plan Note (Addendum)
Patient states that she has been experiencing left sided hip pain for the past 4 days. She described the pain as a 10/10, achy (occasionally sharp), pain in her left lateral hip and thigh that does radiate towards her back. She states the pain is worse with sitting and walking. She was able to get pain relief when she took her percocet, which she has been prescribed from chronic lower extremity pain in the past.  Per patient she has a history of osteoarthritis and her knees are bone on bone. Her pain could be consistent with osteoarthritis of the Hip. Will obtain xray's to evaluate. Patient describes pain as initiating from later hip; if X-rays are negative, will consider greater trochanter pain syndrome. Patient has been prescribed a short course of oxycodone-acetaminophen 5-325 to help with pain flairs in her lower extremities, which will help with her hip pain too (per patient).  - DG Hip Unilateral w/ Pelvis 2-3 views, Left - Naproxen PRN  ADDENDUM: HIP Xray Results - IMPRESSION: Trace bilateral acetabular spurring, possible minimal osteoarthritis. No other explanation for left hip pain.

## 2017-03-09 NOTE — Assessment & Plan Note (Signed)
Patient has extensive history of venous stasis as detailed in note from 01/30/17. Patient continues to complain of pain in her lower extremities. See today's note on peripheral vascular disease for details of this pain. Patient additionally complains that her lower extremity edema is worsening and is requesting unna boots as she states this has worked for her in the past; but her home health nurse will not apply them. Upon exam, patient does not currently have wounds and insurance would not cover this unless she were to first fail traditional compression by developing wound while using compression. Patient stats that she has the compression hose that she prefers at home already and will not be needing other supplies. Patient has vascular surgery appointment on 03/21/17 to establish care since moving from [place]. - Follow up with vascular surgery - Continue with compression - Continue ASA and Xarelto - Oxycodone-Acetamenophen 5-325, q12h PRN Severe Pain, #10

## 2017-03-13 NOTE — Progress Notes (Signed)
Internal Medicine Clinic Attending  I saw and evaluated the patient.  I personally confirmed the key portions of the history and exam documented by Dr. Trilby Drummer and I reviewed pertinent patient test results.  The assessment, diagnosis, and plan were formulated together and I agree with the documentation in the resident's note.  Left hip pain has mixed features of lateral hip pain due to glut tendinopathy, femoral head disease, and low back pain. For now, I want to rule out femoral head OA with left hip xray. Will continue to manage supportively for now.

## 2017-03-21 ENCOUNTER — Encounter: Payer: Self-pay | Admitting: Vascular Surgery

## 2017-03-21 ENCOUNTER — Other Ambulatory Visit: Payer: Self-pay

## 2017-03-21 ENCOUNTER — Ambulatory Visit (INDEPENDENT_AMBULATORY_CARE_PROVIDER_SITE_OTHER): Payer: Medicare Other | Admitting: Vascular Surgery

## 2017-03-21 ENCOUNTER — Ambulatory Visit (HOSPITAL_COMMUNITY)
Admission: RE | Admit: 2017-03-21 | Discharge: 2017-03-21 | Disposition: A | Discharge status: Home / Self Care | Payer: Medicare Other | Source: Ambulatory Visit | Attending: Vascular Surgery | Admitting: Vascular Surgery

## 2017-03-21 ENCOUNTER — Telehealth: Payer: Self-pay | Admitting: Internal Medicine

## 2017-03-21 VITALS — BP 180/105 | HR 60 | Temp 97.5°F | Resp 16 | Ht 74.0 in | Wt 329.0 lb

## 2017-03-21 DIAGNOSIS — I872 Venous insufficiency (chronic) (peripheral): Secondary | ICD-10-CM

## 2017-03-21 NOTE — Progress Notes (Signed)
Patient name: Sharon Vasquez [name]: [id number] DOB: 05-21-1950 Sex: female   REASON FOR CONSULT:    Chronic venous insufficiency.  The consult is requested by Dr. Maricela Bo.  HPI:   Sharon Vasquez is a pleasant 66 y.o. female, who was referred for evaluation of chronic venous insufficiency.  I have reviewed the records from Dr. Thea Gist office.  The patient is followed with multiple medical problems including atrial fibrillation, congestive heart failure, COPD, hyperlipidemia, hypertension, and history of pulmonary embolism.  The patient apparently has had previous venoplasty and stenting of the left common iliac vein and external iliac vein.  This was done in [place]. She is on Xarelto for atrial fibrillation.  She describes significant aching pain and heaviness in both lower extremities which is aggravated by standing and relieved somewhat with elevation.  She has had some ulcers in the past but none recently.  I do not get any history of claudication or rest pain.  Past Medical History:  Diagnosis Date  . A-fib ([place])   . Benign breast cyst in female, left 01/08/2017   Found by Screening mammogram, evaluated by U/S on 01/08/17 and determined to be a benign simple breast cyst.  . CHF (congestive heart failure) ([place])   . COPD (chronic obstructive pulmonary disease) ([place])   . Hyperlipidemia   . Hypertension   . Morbid obesity ([place])   . PE (pulmonary embolism)     Family History  Problem Relation Age of Onset  . Breast cancer Mother   . Hypertension Mother   . Hyperlipidemia Mother   . Hypertension Maternal Grandfather   . Hyperlipidemia Maternal Grandfather     SOCIAL HISTORY: She smokes half a pack per day of cigarettes. Social History   Socioeconomic History  . Marital status: Single    Spouse name: Not on file  . Number of children: Not on file  . Years of education: Not on file  . Highest education level: Not on file  Social Needs  . Financial resource strain: Not on  file  . Food insecurity - worry: Not on file  . Food insecurity - inability: Not on file  . Transportation needs - medical: Not on file  . Transportation needs - non-medical: Not on file  Occupational History  . Not on file  Tobacco Use  . Smoking status: Current Some Day Smoker    Packs/day: 0.50    Years: 54.00    Pack years: 27.00    Types: Cigarettes    Start date: 08/16/1961  . Smokeless tobacco: Never Used  . Tobacco comment: smoking 3 cigarettes/ day  Substance and Sexual Activity  . Alcohol use: No  . Drug use: No  . Sexual activity: Not on file  Other Topics Concern  . Not on file  Social History Narrative   Lives in [facility] in [place]. Lives alone, but is dependent in ADLs/IADLs. Previously had [facility] and RN but dismissed them with plans to use the [employer]. Two daughters live nearby. Previously resided in [place].    No Known Allergies  Current Outpatient Medications  Medication Sig Dispense Refill  . albuterol (PROAIR HFA) 108 (90 Base) MCG/ACT inhaler Inhale 2 puffs into the lungs every 6 (six) hours as needed for wheezing or shortness of breath. 1 Inhaler 1  . albuterol (PROVENTIL) (2.5 MG/3ML) 0.083% nebulizer solution Take 3 mLs (2.5 mg total) by nebulization every 4 (four) hours as needed for wheezing or shortness of breath. 75  mL 0  . amiodarone (PACERONE) 200 MG tablet TAKE 1 TABLET BY MOUTH EVERY DAY 90 tablet 1  . aspirin EC 81 MG tablet Take 81 mg by mouth daily.    [name] atorvastatin (LIPITOR) 40 MG tablet Take 1 tablet (40 mg total) by mouth daily. 30 tablet 3  . benazepril (LOTENSIN) 20 MG tablet Take 1 tablet (20 mg total) by mouth daily. 90 tablet 3  . DOK 100 MG capsule TK 1 C PO BID PRN FOR MODERATE CONSTIPATION  0  . esomeprazole (NEXIUM) 40 MG capsule Take 1 capsule (40 mg total) daily as needed by mouth (for heartburn or indigestion). 30 capsule 2  . Fluticasone-Salmeterol (ADVAIR DISKUS) 250-50 MCG/DOSE AEPB Inhale 1 puff into the lungs  2 (two) times daily. Rinse mouth after each use 60 each 11  . gabapentin (NEURONTIN) 400 MG capsule Take 1 capsule (400 mg total) by mouth daily. (Patient taking differently: Take 400 mg by mouth 3 (three) times daily. ) 30 capsule 0  . potassium chloride (K-DUR,KLOR-CON) 10 MEQ tablet Take 1 tablet (10 mEq total) by mouth 2 (two) times daily. 60 tablet 2  . rivaroxaban (XARELTO) 20 MG TABS tablet Take 1 tablet (20 mg total) by mouth every morning. 30 tablet 5  . tiotropium (SPIRIVA HANDIHALER) 18 MCG inhalation capsule Place 1 capsule (18 mcg total) into inhaler and inhale daily. 30 capsule 11  . torsemide (DEMADEX) 20 MG tablet Take 1 tablet (20 mg total) by mouth 2 (two) times daily. 60 tablet 5  . nicotine (NICODERM CQ - DOSED IN MG/24 HOURS) 21 mg/24hr patch Place 1 patch (21 mg total) onto the skin daily. (Patient not taking: Reported on 03/21/2017) 28 patch 0  . senna-docusate (SENOKOT-S) 8.6-50 MG tablet Take 1 tablet by mouth 2 (two) times daily. (Patient not taking: Reported on 03/21/2017) 60 tablet 11   No current facility-administered medications for this visit.     REVIEW OF SYSTEMS:  [X]  denotes positive finding, [ ]  denotes negative finding Cardiac  Comments:  Chest pain or chest pressure:    Shortness of breath upon exertion: x   Short of breath when lying flat: x   Irregular heart rhythm: x       Vascular    Pain in calf, thigh, or hip brought on by ambulation: x   Pain in feet at night that wakes you up from your sleep:     Blood clot in your veins: x   Leg swelling:  x       Pulmonary    Oxygen at home:    Productive cough:     Wheezing:  x       Neurologic    Sudden weakness in arms or legs:     Sudden numbness in arms or legs:     Sudden onset of difficulty speaking or slurred speech:    Temporary loss of vision in one eye:     Problems with dizziness:         Gastrointestinal    Blood in stool:     Vomited blood:         Genitourinary    Burning when  urinating:     Blood in urine:        Psychiatric    Major depression:  x       Hematologic    Bleeding problems:    Problems with blood clotting too easily:        Skin  Rashes or ulcers: x       Constitutional    Fever or chills:     PHYSICAL EXAM:   Vitals:   03/21/17 1533 03/21/17 1535  BP: (!) 168/106 (!) 180/105  Pulse: 60 60  Resp: 16 16  Temp: (!) 97.5 F (36.4 C) (!) 97.5 F (36.4 C)  TempSrc: Oral Oral  SpO2: 96% 96%  Weight: (!) 329 lb (149.2 kg) (!) 329 lb (149.2 kg)  Height: 6\' 2"  (1.88 m) 6\' 2"  (1.88 m)    GENERAL: The patient is a well-nourished female, in no acute distress. The vital signs are documented above. CARDIAC: There is a regular rate and rhythm.  VASCULAR: I do not detect carotid bruits. She has palpable femoral pulses and palpable dorsalis pedis pulses bilaterally. She has bilateral lower extremity swelling. She has hyperpigmentation bilaterally consistent with chronic venous insufficiency. PULMONARY: There is good air exchange bilaterally without wheezing or rales. ABDOMEN: Soft and non-tender with normal pitched bowel sounds.  MUSCULOSKELETAL: There are no major deformities or cyanosis. NEUROLOGIC: No focal weakness or paresthesias are detected. SKIN: There are no ulcers or rashes noted. PSYCHIATRIC: The patient has a normal affect.  DATA:    BILATERAL LOWER EXTREMITY VENOUS DUPLEX: I have independently interpreted the bilateral lower extremity venous duplex scan.  On the right side there is no evidence of DVT or superficial thrombophlebitis.  There is deep venous reflux involving the common femoral vein and popliteal vein.  There is superficial venous reflux involving the saphenofemoral junction and right great saphenous vein.  On the left side there is no evidence of DVT or superficial thrombophlebitis.  There is deep venous reflux involving the left common femoral vein and femoral vein.  There is superficial venous reflux involving  the left saphenofemoral junction and left great saphenous vein.  MEDICAL ISSUES:   CHRONIC VENOUS INSUFFICIENCY: Based on her exam she clearly has evidence of chronic venous insufficiency.  She has had a previous left iliac stent and this appears to be patent by duplex.  She has no evidence of DVT currently.  She does have some deep venous reflux bilaterally.  She has difficulty with compression stockings cutting into her ankles and therefore she is reluctant to continue to use these.  Therefore I have emphasized to her the importance of leg elevation and the proper positioning for this.  I have given her a prescription for the correct size leg rest.  Of also discussed the importance of trying to avoid prolonged sitting and standing and to exercise as much as possible.  I also recommended water aerobics but she is reluctant to get in the pool.  Certainly if her swelling or symptoms progress we could consider a CT venogram to further assess her pelvic veins however currently her venous duplex scan does not suggest DVT or significant outflow obstruction.  She was also requesting narcotics but I did not think this would be helpful for the pain and have encouraged her to focus on leg elevation is the best method of relieving her pain.  I will be happy to see her back if her symptoms progress or leg swelling progresses.  HYPERTENSION: The patient's initial blood pressure today was elevated. We repeated this and this was still elevated. We have encouraged the patient to follow up with their primary care physician for management of their blood pressure.  Deitra Mayo [facility] [phone]

## 2017-03-21 NOTE — Telephone Encounter (Signed)
PATIENT IS HAVING SEVERE PAIN WOULD LIKE A CALL BACK TO BE SET UP WITH A PAIN MANAGEMENT CENTER

## 2017-03-21 NOTE — Telephone Encounter (Signed)
rtc no answer, lm for rtc 

## 2017-03-22 ENCOUNTER — Telehealth: Payer: Self-pay | Admitting: Internal Medicine

## 2017-03-22 NOTE — Telephone Encounter (Signed)
Pt is upset about pain would like an update on referral

## 2017-03-22 NOTE — Telephone Encounter (Deleted)
Pt calls and is upset about her referral to pain management, states it has been a long time, nov, and she has not heard anything, she is in pain and needs somebody to help her. Please call her and give an update

## 2017-03-22 NOTE — Telephone Encounter (Signed)
Patient is calling back, regarding pain medicine. If patient don't answer left a message please

## 2017-03-22 NOTE — Telephone Encounter (Signed)
Called pt - no answer; there's already a pain referral in place (11/26) waiting on a response per the referral.

## 2017-03-23 ENCOUNTER — Telehealth: Payer: Self-pay | Admitting: *Deleted

## 2017-03-23 NOTE — Telephone Encounter (Signed)
This has been addressed by lelas.

## 2017-03-23 NOTE — Telephone Encounter (Signed)
LVM FOR PATIENT TO RETURN CALL TO CLINIC REGARDING HER REFERRAL/ HER FIRST PAIN CLINIC REQUEST WAS DENIED / WAITING TO HEAR FROM THE SECOND REFERRAL REQUEST.

## 2017-03-23 NOTE — Telephone Encounter (Signed)
SPOKE WITH PATIENT , SHE IS AWARE THAT THE SECOND REFERRAL HAS BEEN SENT / WAITING TO HEAR BACK FROM OFFICE / FIRST REFERRAL WAS DENIED.

## 2017-04-11 ENCOUNTER — Telehealth: Payer: Self-pay | Admitting: Internal Medicine

## 2017-04-11 NOTE — Telephone Encounter (Signed)
Pt states she rec'd paperwork and booklet on opioid addiction from [facility] today also her appt is in [place] and scat does not go there, she checked w/ [facility]at and they go as far as [facility]. She is very upset. States she not need opioid addiction services she needs pain management. She states she is highly insulted. Explained that it may be [facility]'s policy to send all pts this information but that she would need to ask someone there, she states she wants to know from [place]. She states she may not be able to afford to go there also. She also states she only needs 1 percocet every other day because it lasts in her system for 2 days, she doesn't understand why dr [facility] [place] couldn't just give her 10 or 15 per month instead of sending her somewhere so far away.

## 2017-04-11 NOTE — Telephone Encounter (Signed)
PATIENT RECEIVED BOOKLET TO DRUG ADDICTION CENTER IN [place], WHY WAS AN APPT. MADE FOR HER, SHE NEEDS TO GO TO PAIN MANAGEMENT CENTER INSTEAD. CALL PT AT [phone]

## 2017-04-13 NOTE — Telephone Encounter (Signed)
I have discussed with Sharon Vasquez that I do not think that chronic opioid medications are the best treatment strategy for her chronic pain, by her request I did place the referral to pain management.  I have not changed my assessment of this.

## 2017-04-17 ENCOUNTER — Encounter (HOSPITAL_COMMUNITY): Payer: Self-pay

## 2017-04-17 ENCOUNTER — Inpatient Hospital Stay (HOSPITAL_COMMUNITY)
Admission: EM | Admit: 2017-04-17 | Discharge: 2017-04-19 | DRG: 194 | Disposition: A | Discharge status: Home / Self Care | Payer: Medicare Other | Attending: Student in an Organized Health Care Education/Training Program | Admitting: Student in an Organized Health Care Education/Training Program

## 2017-04-17 ENCOUNTER — Emergency Department (HOSPITAL_COMMUNITY): Payer: Medicare Other

## 2017-04-17 ENCOUNTER — Other Ambulatory Visit: Payer: Self-pay

## 2017-04-17 DIAGNOSIS — I739 Peripheral vascular disease, unspecified: Secondary | ICD-10-CM | POA: Diagnosis present

## 2017-04-17 DIAGNOSIS — Z7982 Long term (current) use of aspirin: Secondary | ICD-10-CM

## 2017-04-17 DIAGNOSIS — I480 Paroxysmal atrial fibrillation: Secondary | ICD-10-CM | POA: Diagnosis not present

## 2017-04-17 DIAGNOSIS — R918 Other nonspecific abnormal finding of lung field: Secondary | ICD-10-CM

## 2017-04-17 DIAGNOSIS — J1008 Influenza due to other identified influenza virus with other specified pneumonia: Secondary | ICD-10-CM | POA: Diagnosis present

## 2017-04-17 DIAGNOSIS — I872 Venous insufficiency (chronic) (peripheral): Secondary | ICD-10-CM | POA: Diagnosis not present

## 2017-04-17 DIAGNOSIS — R079 Chest pain, unspecified: Secondary | ICD-10-CM

## 2017-04-17 DIAGNOSIS — I8312 Varicose veins of left lower extremity with inflammation: Secondary | ICD-10-CM | POA: Diagnosis not present

## 2017-04-17 DIAGNOSIS — Z79899 Other long term (current) drug therapy: Secondary | ICD-10-CM

## 2017-04-17 DIAGNOSIS — Z720 Tobacco use: Secondary | ICD-10-CM | POA: Diagnosis present

## 2017-04-17 DIAGNOSIS — J441 Chronic obstructive pulmonary disease with (acute) exacerbation: Secondary | ICD-10-CM

## 2017-04-17 DIAGNOSIS — J449 Chronic obstructive pulmonary disease, unspecified: Secondary | ICD-10-CM | POA: Diagnosis not present

## 2017-04-17 DIAGNOSIS — I110 Hypertensive heart disease with heart failure: Secondary | ICD-10-CM | POA: Diagnosis present

## 2017-04-17 DIAGNOSIS — I5030 Unspecified diastolic (congestive) heart failure: Secondary | ICD-10-CM | POA: Diagnosis not present

## 2017-04-17 DIAGNOSIS — J440 Chronic obstructive pulmonary disease with acute lower respiratory infection: Secondary | ICD-10-CM | POA: Diagnosis present

## 2017-04-17 DIAGNOSIS — Z90710 Acquired absence of both cervix and uterus: Secondary | ICD-10-CM

## 2017-04-17 DIAGNOSIS — E785 Hyperlipidemia, unspecified: Secondary | ICD-10-CM | POA: Diagnosis present

## 2017-04-17 DIAGNOSIS — J189 Pneumonia, unspecified organism: Secondary | ICD-10-CM

## 2017-04-17 DIAGNOSIS — Z86711 Personal history of pulmonary embolism: Secondary | ICD-10-CM

## 2017-04-17 DIAGNOSIS — I5031 Acute diastolic (congestive) heart failure: Secondary | ICD-10-CM | POA: Diagnosis present

## 2017-04-17 DIAGNOSIS — F419 Anxiety disorder, unspecified: Secondary | ICD-10-CM | POA: Diagnosis not present

## 2017-04-17 DIAGNOSIS — J09X2 Influenza due to identified novel influenza A virus with other respiratory manifestations: Secondary | ICD-10-CM

## 2017-04-17 DIAGNOSIS — Z6841 Body Mass Index (BMI) 40.0 and over, adult: Secondary | ICD-10-CM | POA: Diagnosis not present

## 2017-04-17 DIAGNOSIS — R0789 Other chest pain: Secondary | ICD-10-CM | POA: Diagnosis not present

## 2017-04-17 DIAGNOSIS — R05 Cough: Secondary | ICD-10-CM | POA: Diagnosis not present

## 2017-04-17 DIAGNOSIS — Z7901 Long term (current) use of anticoagulants: Secondary | ICD-10-CM | POA: Diagnosis not present

## 2017-04-17 DIAGNOSIS — Z86718 Personal history of other venous thrombosis and embolism: Secondary | ICD-10-CM

## 2017-04-17 DIAGNOSIS — R0602 Shortness of breath: Secondary | ICD-10-CM

## 2017-04-17 DIAGNOSIS — J129 Viral pneumonia, unspecified: Secondary | ICD-10-CM | POA: Diagnosis not present

## 2017-04-17 DIAGNOSIS — I5020 Unspecified systolic (congestive) heart failure: Secondary | ICD-10-CM | POA: Diagnosis present

## 2017-04-17 DIAGNOSIS — I10 Essential (primary) hypertension: Secondary | ICD-10-CM | POA: Diagnosis present

## 2017-04-17 DIAGNOSIS — R072 Precordial pain: Secondary | ICD-10-CM | POA: Diagnosis present

## 2017-04-17 DIAGNOSIS — J09X1 Influenza due to identified novel influenza A virus with pneumonia: Secondary | ICD-10-CM | POA: Diagnosis not present

## 2017-04-17 DIAGNOSIS — I4891 Unspecified atrial fibrillation: Secondary | ICD-10-CM | POA: Diagnosis present

## 2017-04-17 DIAGNOSIS — Z7951 Long term (current) use of inhaled steroids: Secondary | ICD-10-CM

## 2017-04-17 DIAGNOSIS — J101 Influenza due to other identified influenza virus with other respiratory manifestations: Secondary | ICD-10-CM | POA: Diagnosis present

## 2017-04-17 DIAGNOSIS — F17210 Nicotine dependence, cigarettes, uncomplicated: Secondary | ICD-10-CM | POA: Diagnosis present

## 2017-04-17 DIAGNOSIS — I8311 Varicose veins of right lower extremity with inflammation: Secondary | ICD-10-CM | POA: Diagnosis not present

## 2017-04-17 HISTORY — DX: Anxiety disorder, unspecified: F41.9

## 2017-04-17 HISTORY — DX: Other chronic pain: G89.29

## 2017-04-17 HISTORY — DX: Headache, unspecified: R51.9

## 2017-04-17 HISTORY — DX: Low back pain, unspecified: M54.50

## 2017-04-17 HISTORY — DX: Gastro-esophageal reflux disease without esophagitis: K21.9

## 2017-04-17 HISTORY — DX: Low back pain: M54.5

## 2017-04-17 HISTORY — DX: Unspecified osteoarthritis, unspecified site: M19.90

## 2017-04-17 HISTORY — DX: Depression, unspecified: F32.A

## 2017-04-17 HISTORY — DX: Major depressive disorder, single episode, unspecified: F32.9

## 2017-04-17 HISTORY — DX: Headache: R51

## 2017-04-17 LAB — I-STAT CG4 LACTIC ACID, ED: Lactic Acid, Venous: 1.74 mmol/L (ref 0.5–1.9)

## 2017-04-17 LAB — CBC WITH DIFFERENTIAL/PLATELET
Basophils Absolute: 0 10*3/uL (ref 0.0–0.1)
Basophils Relative: 0 %
Eosinophils Absolute: 0 10*3/uL (ref 0.0–0.7)
Eosinophils Relative: 0 %
HCT: 40 % (ref 36.0–46.0)
Hemoglobin: 13.4 g/dL (ref 12.0–15.0)
Lymphocytes Relative: 18 %
Lymphs Abs: 1 10*3/uL (ref 0.7–4.0)
MCH: 28.5 pg (ref 26.0–34.0)
MCHC: 33.5 g/dL (ref 30.0–36.0)
MCV: 84.9 fL (ref 78.0–100.0)
Monocytes Absolute: 0.4 10*3/uL (ref 0.1–1.0)
Monocytes Relative: 6 %
Neutro Abs: 4.5 10*3/uL (ref 1.7–7.7)
Neutrophils Relative %: 76 %
Platelets: 338 10*3/uL (ref 150–400)
RBC: 4.71 MIL/uL (ref 3.87–5.11)
RDW: 14.3 % (ref 11.5–15.5)
WBC: 5.9 10*3/uL (ref 4.0–10.5)

## 2017-04-17 LAB — I-STAT TROPONIN, ED: Troponin i, poc: 0 ng/mL (ref 0.00–0.08)

## 2017-04-17 LAB — URINALYSIS, ROUTINE W REFLEX MICROSCOPIC
Bilirubin Urine: NEGATIVE
Glucose, UA: NEGATIVE mg/dL
Hgb urine dipstick: NEGATIVE
Ketones, ur: NEGATIVE mg/dL
Leukocytes, UA: NEGATIVE
Nitrite: NEGATIVE
Protein, ur: NEGATIVE mg/dL
Specific Gravity, Urine: 1.016 (ref 1.005–1.030)
pH: 7 (ref 5.0–8.0)

## 2017-04-17 LAB — COMPREHENSIVE METABOLIC PANEL
ALT: 15 U/L (ref 14–54)
AST: 21 U/L (ref 15–41)
Albumin: 3.7 g/dL (ref 3.5–5.0)
Alkaline Phosphatase: 92 U/L (ref 38–126)
Anion gap: 14 (ref 5–15)
BUN: 9 mg/dL (ref 6–20)
CO2: 21 mmol/L — ABNORMAL LOW (ref 22–32)
Calcium: 9.2 mg/dL (ref 8.9–10.3)
Chloride: 103 mmol/L (ref 101–111)
Creatinine, Ser: 0.9 mg/dL (ref 0.44–1.00)
GFR calc Af Amer: >60 mL/min (ref >60)
GFR calc non Af Amer: >60 mL/min (ref >60)
Glucose, Bld: 128 mg/dL — ABNORMAL HIGH (ref 65–99)
Potassium: 3.6 mmol/L (ref 3.5–5.1)
Sodium: 138 mmol/L (ref 135–145)
Total Bilirubin: 0.8 mg/dL (ref 0.3–1.2)
Total Protein: 8.1 g/dL (ref 6.5–8.1)

## 2017-04-17 LAB — INFLUENZA PANEL BY PCR (TYPE A & B)
Influenza A By PCR: POSITIVE — AB
Influenza B By PCR: NEGATIVE

## 2017-04-17 LAB — TROPONIN I
Troponin I: 0.07 ng/mL
Troponin I: 0.07 ng/mL (ref <0.03)

## 2017-04-17 LAB — BRAIN NATRIURETIC PEPTIDE: B Natriuretic Peptide: 118 pg/mL — ABNORMAL HIGH (ref 0.0–100.0)

## 2017-04-17 MED ORDER — ALBUTEROL (5 MG/ML) CONTINUOUS INHALATION SOLN
10.0000 mg/h | INHALATION_SOLUTION | RESPIRATORY_TRACT | Status: DC
  Administered 2017-04-17: 10 mg/h via RESPIRATORY_TRACT
  Filled 2017-04-17: qty 20

## 2017-04-17 MED ORDER — ATORVASTATIN CALCIUM 40 MG PO TABS
40.0000 mg | ORAL_TABLET | Freq: Every day | ORAL | Status: DC
  Administered 2017-04-17 – 2017-04-18 (×2): 40 mg via ORAL
  Filled 2017-04-17 (×3): qty 1

## 2017-04-17 MED ORDER — ACETAMINOPHEN 325 MG PO TABS
650.0000 mg | ORAL_TABLET | Freq: Four times a day (QID) | ORAL | Status: DC | PRN
  Administered 2017-04-17 – 2017-04-19 (×6): 650 mg via ORAL
  Filled 2017-04-17 (×6): qty 2

## 2017-04-17 MED ORDER — RIVAROXABAN 20 MG PO TABS
20.0000 mg | ORAL_TABLET | Freq: Every day | ORAL | Status: DC
  Administered 2017-04-17 – 2017-04-18 (×2): 20 mg via ORAL
  Filled 2017-04-17 (×3): qty 1

## 2017-04-17 MED ORDER — AMIODARONE HCL 200 MG PO TABS
200.0000 mg | ORAL_TABLET | Freq: Every day | ORAL | Status: DC
  Administered 2017-04-17 – 2017-04-18 (×2): 200 mg via ORAL
  Filled 2017-04-17 (×3): qty 1

## 2017-04-17 MED ORDER — BENAZEPRIL HCL 20 MG PO TABS
20.0000 mg | ORAL_TABLET | Freq: Every day | ORAL | Status: DC
  Administered 2017-04-17 – 2017-04-18 (×2): 20 mg via ORAL
  Filled 2017-04-17 (×3): qty 1

## 2017-04-17 MED ORDER — PREDNISONE 20 MG PO TABS
40.0000 mg | ORAL_TABLET | Freq: Every day | ORAL | Status: DC
  Filled 2017-04-17: qty 2

## 2017-04-17 MED ORDER — METHYLPREDNISOLONE SODIUM SUCC 125 MG IJ SOLR
125.0000 mg | Freq: Once | INTRAMUSCULAR | Status: AC
  Administered 2017-04-17: 125 mg via INTRAVENOUS
  Filled 2017-04-17: qty 2

## 2017-04-17 MED ORDER — ONDANSETRON HCL 4 MG/2ML IJ SOLN: 4.0000 mg | Freq: Four times a day (QID) | INTRAMUSCULAR | Status: DC | PRN

## 2017-04-17 MED ORDER — SODIUM CHLORIDE 0.9 % IV SOLN: 250.0000 mL | INTRAVENOUS | Status: DC | PRN

## 2017-04-17 MED ORDER — ASPIRIN EC 81 MG PO TBEC
81.0000 mg | DELAYED_RELEASE_TABLET | Freq: Every day | ORAL | Status: DC
  Administered 2017-04-17 – 2017-04-18 (×2): 81 mg via ORAL
  Filled 2017-04-17 (×3): qty 1

## 2017-04-17 MED ORDER — SODIUM CHLORIDE 0.9% FLUSH
3.0000 mL | Freq: Two times a day (BID) | INTRAVENOUS | Status: DC
  Administered 2017-04-17 – 2017-04-18 (×3): 3 mL via INTRAVENOUS

## 2017-04-17 MED ORDER — TORSEMIDE 20 MG PO TABS
20.0000 mg | ORAL_TABLET | Freq: Two times a day (BID) | ORAL | Status: DC
  Administered 2017-04-18 – 2017-04-19 (×2): 20 mg via ORAL
  Filled 2017-04-17 (×4): qty 1

## 2017-04-17 MED ORDER — LEVOFLOXACIN IN D5W 750 MG/150ML IV SOLN
750.0000 mg | Freq: Once | INTRAVENOUS | Status: AC
  Administered 2017-04-17: 750 mg via INTRAVENOUS
  Filled 2017-04-17: qty 150

## 2017-04-17 MED ORDER — IPRATROPIUM-ALBUTEROL 0.5-2.5 (3) MG/3ML IN SOLN
3.0000 mL | RESPIRATORY_TRACT | Status: DC | PRN
  Administered 2017-04-18: 3 mL via RESPIRATORY_TRACT
  Filled 2017-04-17: qty 3

## 2017-04-17 MED ORDER — GUAIFENESIN-DM 100-10 MG/5ML PO SYRP
5.0000 mL | ORAL_SOLUTION | ORAL | Status: DC | PRN
  Administered 2017-04-17 – 2017-04-18 (×4): 5 mL via ORAL
  Filled 2017-04-17 (×4): qty 5

## 2017-04-17 MED ORDER — ACETAMINOPHEN 650 MG RE SUPP: 650.0000 mg | Freq: Four times a day (QID) | RECTAL | Status: DC | PRN

## 2017-04-17 MED ORDER — TIOTROPIUM BROMIDE MONOHYDRATE 18 MCG IN CAPS
18.0000 ug | ORAL_CAPSULE | Freq: Every day | RESPIRATORY_TRACT | Status: DC
  Administered 2017-04-18 – 2017-04-19 (×2): 18 ug via RESPIRATORY_TRACT
  Filled 2017-04-17: qty 5

## 2017-04-17 MED ORDER — ONDANSETRON HCL 4 MG PO TABS: 4.0000 mg | ORAL_TABLET | Freq: Four times a day (QID) | ORAL | Status: DC | PRN

## 2017-04-17 MED ORDER — GABAPENTIN 400 MG PO CAPS
400.0000 mg | ORAL_CAPSULE | Freq: Every day | ORAL | Status: DC
  Administered 2017-04-17 – 2017-04-18 (×2): 400 mg via ORAL
  Filled 2017-04-17 (×3): qty 1

## 2017-04-17 MED ORDER — MOMETASONE FURO-FORMOTEROL FUM 200-5 MCG/ACT IN AERO
2.0000 | INHALATION_SPRAY | Freq: Two times a day (BID) | RESPIRATORY_TRACT | Status: DC
  Administered 2017-04-17 – 2017-04-19 (×4): 2 via RESPIRATORY_TRACT
  Filled 2017-04-17: qty 8.8

## 2017-04-17 MED ORDER — SODIUM CHLORIDE 0.9% FLUSH: 3.0000 mL | INTRAVENOUS | Status: DC | PRN

## 2017-04-17 MED ORDER — SODIUM CHLORIDE 0.9% FLUSH
3.0000 mL | Freq: Two times a day (BID) | INTRAVENOUS | Status: DC
  Administered 2017-04-18: 3 mL via INTRAVENOUS

## 2017-04-17 NOTE — ED Notes (Signed)
Waiting for IV Team for IV access.

## 2017-04-17 NOTE — Progress Notes (Signed)
DEANDRE STANSEL is a 67 y.o. female patient admitted from ED awake, alert - oriented  X 4 - no acute distress noted.  VSS - Blood pressure (!) 148/82, pulse 86, temperature 99.6 F (37.6 C), temperature source Oral, resp. rate (!) 25, height 6\' 2"  (1.88 m), weight (!) 145.2 kg (320 lb), SpO2 95 %.    IV in place, occlusive dsg intact without redness.  Orientation to room, and floor completed with information packet given to patient/family.  Patient declined safety video at this time.  Admission INP armband ID verified with patient/family, and in place.   SR up x 2, fall assessment complete, with patient and family able to verbalize understanding of risk associated with falls, and verbalized understanding to call nsg before up out of bed.  Call light within reach, patient able to voice, and demonstrate understanding.  Skin, clean-dry- intact without evidence of bruising, or skin tears.   No evidence of skin break down noted on exam.     Will cont to eval and treat per MD orders.  Celine Ahr, RN 04/17/2017 3:51 PM

## 2017-04-17 NOTE — Progress Notes (Signed)
Received report on pt.

## 2017-04-17 NOTE — ED Notes (Signed)
Phlebotomy at the bedside  

## 2017-04-17 NOTE — ED Notes (Signed)
Phlebotomy at the bedside. Attempted IV x2 Unable to access.

## 2017-04-17 NOTE — ED Notes (Signed)
PurWick applied 

## 2017-04-17 NOTE — ED Notes (Signed)
Troponin 0. Reported to MD [facility].

## 2017-04-17 NOTE — ED Triage Notes (Signed)
Per [facility], Pt is coming from home with complaints of respiratory distress and decreased appetite that started three weeks ago that got worse today. Used her rescue inhaler with no relief. Productive cough noted with diarrhea. EMS gave patient Tylenol 1000 mg due to her temperatures 102 F reading high, 10 mg of Albuterol and 1 mg of Atrovent given during trandport. Pt did report some CP since this morning at 0500 and severe intermittent headaches for three days. 94% on RA, 99% on Neb, 215/112.

## 2017-04-17 NOTE — ED Notes (Signed)
Admitting at the bedside.  

## 2017-04-17 NOTE — ED Provider Notes (Signed)
[facility] Provider Note   CSN: [id number] Arrival date & time: 04/17/17  0744     History   Chief Complaint Chief Complaint  Patient presents with  . Respiratory Distress    HPI Sharon Vasquez is a 67 y.o. female.  HPI Patient presents with 3 weeks of shortness of breath worsening over the last 3 days.  She has had chills and subjective fever.  Complains of chest and thoracic back pain that is worse with deep breathing and coughing.  Her cough is productive of yellow sputum.  She also states that she has had intermittent loose stools.  Bilateral lower extremity swelling and discomfort.  Given Tylenol, breathing treatment in transport.   Past Medical History:  Diagnosis Date  . A-fib ([place])   . Benign breast cyst in female, left 01/08/2017   Found by Screening mammogram, evaluated by U/S on 01/08/17 and determined to be a benign simple breast cyst.  . CHF (congestive heart failure) ([place])   . COPD (chronic obstructive pulmonary disease) ([place])   . Hyperlipidemia   . Hypertension   . Morbid obesity ([place])   . PE (pulmonary embolism)     Patient Active Problem List   Diagnosis Date Noted  . Left hip pain 03/09/2017  . Major depression, recurrent, chronic ([place]) 02/01/2017  . Benign breast cyst in female, left 01/08/2017  . Osteoporosis 12/14/2016  . Hepatitis C antibody positive in blood 12/01/2016  . DVT (deep venous thrombosis) ([place]) 11/16/2016  . Chronic low back pain 08/21/2016  . Drug induced constipation 08/21/2016  . Elevated blood sugar 07/19/2016  . Abdominal pain 07/19/2016  . Aortic atherosclerosis ([place]) 07/17/2016  . Chronic venous stasis dermatitis of both lower extremities 07/12/2016  . Chronic anticoagulation 07/12/2016  . Tobacco use 07/11/2016  . COPD (chronic obstructive pulmonary disease) ([place])   . (HFpEF) heart failure with preserved ejection fraction ([place])   . History of pulmonary embolism   . PAF (paroxysmal  atrial fibrillation) ([place])   . Vocal cord polyp 12/18/2015  . Laryngopharyngeal reflux 12/18/2015  . Dysphonia 12/16/2015  . Morbid obesity due to excess calories ([place])   . Peripheral vascular disease ([place])   . Benign essential HTN   . Pulmonary embolism ([place]) 09/21/2014  . Atrial fibrillation with RVR ([place]) 09/21/2014  . Primary osteoarthritis of both knees 09/16/2014    Past Surgical History:  Procedure Laterality Date  . ABDOMINAL HYSTERECTOMY    . APPENDECTOMY    . BREAST CYST EXCISION    . BREAST LUMPECTOMY Left   . CHOLECYSTECTOMY    . TONSILLECTOMY      OB History    No data available       Home Medications    Prior to Admission medications   Medication Sig Start Date End Date Taking? Authorizing Provider  albuterol (PROAIR HFA) 108 (90 Base) MCG/ACT inhaler Inhale 2 puffs into the lungs every 6 (six) hours as needed for wheezing or shortness of breath. 08/14/16  Yes Lucious Groves, DO  albuterol (PROVENTIL) (2.5 MG/3ML) 0.083% nebulizer solution Take 3 mLs (2.5 mg total) by nebulization every 4 (four) hours as needed for wheezing or shortness of breath. 02/20/17 04/17/17 Yes Lucious Groves, DO  amiodarone (PACERONE) 200 MG tablet TAKE 1 TABLET BY MOUTH EVERY DAY Patient taking differently: 200MG  BY MOUTH ONCE DAILY 01/29/17  Yes Almyra Deforest, PA  aspirin EC 81 MG tablet Take 81 mg by mouth daily.   Yes [provider]  atorvastatin (LIPITOR) 40 MG tablet Take 1 tablet (40 mg total) by mouth daily. 12/30/16  Yes Lucious Groves, DO  benazepril (LOTENSIN) 20 MG tablet Take 1 tablet (20 mg total) by mouth daily. 11/30/16 11/30/17 Yes Lucious Groves, DO  DM-Doxylamine-Acetaminophen (NYQUIL COLD & FLU PO) Take 30 mLs by mouth at bedtime as needed (cough).   Yes [provider]  DOK 100 MG capsule TK 1 C PO BID PRN FOR MODERATE CONSTIPATION 09/23/16  Yes [provider]  esomeprazole (NEXIUM) 40 MG capsule Take 1 capsule (40 mg total) daily as needed by  mouth (for heartburn or indigestion). 02/06/17  Yes Lucious Groves, DO  Fluticasone-Salmeterol (ADVAIR DISKUS) 250-50 MCG/DOSE AEPB Inhale 1 puff into the lungs 2 (two) times daily. Rinse mouth after each use 01/24/17 01/24/18 Yes Lucious Groves, DO  gabapentin (NEURONTIN) 400 MG capsule Take 1 capsule (400 mg total) by mouth daily. Patient taking differently: Take 400 mg by mouth 2 (two) times daily.  08/14/16  Yes Lucious Groves, DO  guaiFENesin (ROBITUSSIN) 100 MG/5ML liquid Take 200 mg by mouth 3 (three) times daily as needed for cough.   Yes [provider]  potassium chloride (K-DUR,KLOR-CON) 10 MEQ tablet Take 1 tablet (10 mEq total) by mouth 2 (two) times daily. 09/14/16  Yes Lorella Nimrod, MD  Pseudoephedrine-APAP-DM (DAYQUIL MULTI-SYMPTOM COLD/FLU PO) Take 15 mLs by mouth daily as needed (cough).   Yes [provider]  rivaroxaban (XARELTO) 20 MG TABS tablet Take 1 tablet (20 mg total) by mouth every morning. 12/21/16  Yes Lucious Groves, DO  tiotropium (SPIRIVA HANDIHALER) 18 MCG inhalation capsule Place 1 capsule (18 mcg total) into inhaler and inhale daily. 09/07/16  Yes Alphonzo Grieve, MD  torsemide (DEMADEX) 20 MG tablet Take 1 tablet (20 mg total) by mouth 2 (two) times daily. Patient taking differently: Take 20 mg by mouth daily as needed (fluid retention).  11/09/16  Yes Lucious Groves, DO  nicotine (NICODERM CQ - DOSED IN MG/24 HOURS) 21 mg/24hr patch Place 1 patch (21 mg total) onto the skin daily. Patient not taking: Reported on 03/21/2017 09/08/16   Alphonzo Grieve, MD  senna-docusate (SENOKOT-S) 8.6-50 MG tablet Take 1 tablet by mouth 2 (two) times daily. Patient not taking: Reported on 03/21/2017 11/09/16   Lucious Groves, DO    Family History Family History  Problem Relation Age of Onset  . Breast cancer Mother   . Hypertension Mother   . Hyperlipidemia Mother   . Hypertension Maternal Grandfather   . Hyperlipidemia Maternal Grandfather     Social  History Social History   Tobacco Use  . Smoking status: Current Some Day Smoker    Packs/day: 0.50    Years: 54.00    Pack years: 27.00    Types: Cigarettes    Start date: 08/16/1961  . Smokeless tobacco: Never Used  . Tobacco comment: smoking 3 cigarettes/ day  Substance Use Topics  . Alcohol use: No  . Drug use: No     Allergies   Patient has no known allergies.   Review of Systems Review of Systems  Constitutional: Positive for chills, fatigue and fever.  Eyes: Negative for visual disturbance.  Respiratory: Positive for cough and shortness of breath.   Cardiovascular: Positive for chest pain and leg swelling. Negative for palpitations.  Gastrointestinal: Positive for diarrhea. Negative for abdominal pain, nausea and vomiting.  Genitourinary: Negative for dysuria, flank pain and frequency.  Musculoskeletal: Positive for back  pain and myalgias. Negative for neck pain and neck stiffness.  Skin: Negative for rash and wound.  Neurological: Positive for headaches. Negative for dizziness, weakness, light-headedness and numbness.  All other systems reviewed and are negative.    Physical Exam Updated Vital Signs BP (!) 146/74   Pulse 100   Temp 100 F (37.8 C) (Oral)   Resp (!) 31   Ht 6\' 2"  (1.88 m)   Wt (!) 145.2 kg (320 lb)   SpO2 95%   BMI 41.09 kg/m   Physical Exam  Constitutional: She is oriented to person, place, and time. She appears well-developed and well-nourished.  HENT:  Head: Normocephalic and atraumatic.  Mouth/Throat: Oropharynx is clear and moist. No oropharyngeal exudate.  Eyes: EOM are normal. Pupils are equal, round, and reactive to light.  Neck: Normal range of motion. Neck supple.  No meningismus  Cardiovascular: Normal rate and regular rhythm. Exam reveals no gallop and no friction rub.  No murmur heard. Pulmonary/Chest: Effort normal. She has wheezes. She has rales.  Expiratory wheezes in upper lung fields.  Diminished breath sounds and  scattered rales in bilateral bases.  Increase work of breathing.  Abdominal: Soft. Bowel sounds are normal. There is no tenderness. There is no rebound and no guarding.  Musculoskeletal: Normal range of motion. She exhibits edema. She exhibits no tenderness.  2+ edema bilaterally.  Lower extremities grossly symmetric.  Neurological: She is alert and oriented to person, place, and time.  Moving all extremities without focal deficit.  Sensation intact.  Skin: Skin is warm and dry. No rash noted. No erythema.  Psychiatric: She has a normal mood and affect. Her behavior is normal.  Nursing note and vitals reviewed.    ED Treatments / Results  Labs (all labs ordered are listed, but only abnormal results are displayed) Labs Reviewed  COMPREHENSIVE METABOLIC PANEL - Abnormal; Notable for the following components:      Result Value   CO2 21 (*)    Glucose, Bld 128 (*)    All other components within normal limits  BRAIN NATRIURETIC PEPTIDE - Abnormal; Notable for the following components:   B Natriuretic Peptide 118.0 (*)    All other components within normal limits  CULTURE, BLOOD (ROUTINE X 2)  CULTURE, BLOOD (ROUTINE X 2)  CBC WITH DIFFERENTIAL/PLATELET  URINALYSIS, ROUTINE W REFLEX MICROSCOPIC  INFLUENZA PANEL BY PCR (TYPE A & B)  I-STAT CG4 LACTIC ACID, ED  I-STAT TROPONIN, ED    EKG  EKG Interpretation  Date/Time:  Tuesday April 17 2017 07:53:51 EST Ventricular Rate:  97 PR Interval:    QRS Duration: 109 QT Interval:  375 QTC Calculation: 477 R Axis:   -41 Text Interpretation:  Sinus rhythm Left atrial enlargement LVH with secondary repolarization abnormality Anterior Q waves, possibly due to LVH Confirmed by Julianne Rice [phone]) on 04/17/2017 10:49:33 AM       Radiology Dg Chest Port 1 View  Result Date: 04/17/2017 CLINICAL DATA:  Shortness of breath. History of COPD, CHF, morbid obesity, current smoker. EXAM: PORTABLE CHEST 1 VIEW COMPARISON:  Chest x-ray of  December 21, 2016 and chest CT scan of December 22, 2016. FINDINGS: The lungs are adequately inflated. The interstitial markings are increased and there are airspace opacities on the left. The cardiac silhouette remains enlarged. The pulmonary vascularity is mildly engorged. The trachea is midline. There is no pleural effusion. There is multilevel degenerative disc disease of the thoracic spine. IMPRESSION: The findings suggest CHF with mild interstitial  and early alveolar edema. One cannot exclude pneumonia in the appropriate clinical setting. Electronically Signed   By: Yecheskel Kurek  [place] M.D.   On: 04/17/2017 08:43    Procedures Procedures (including critical care time)  Medications Ordered in ED Medications  albuterol (PROVENTIL,VENTOLIN) solution continuous neb (0 mg/hr Nebulization Stopped 04/17/17 0915)  methylPREDNISolone sodium succinate (SOLU-MEDROL) 125 mg/2 mL injection 125 mg (125 mg Intravenous Given 04/17/17 0915)  levofloxacin (LEVAQUIN) IVPB 750 mg (750 mg Intravenous New Bag/Given 04/17/17 0915)   CRITICAL CARE Performed by: Julianne Rice Total critical care time: 35 minutes Critical care time was exclusive of separately billable procedures and treating other patients. Critical care was necessary to treat or prevent imminent or life-threatening deterioration. Critical care was time spent personally by me on the following activities: development of treatment plan with patient and/or surrogate as well as nursing, discussions with consultants, evaluation of patient's response to treatment, examination of patient, obtaining history from patient or surrogate, ordering and performing treatments and interventions, ordering and review of laboratory studies, ordering and review of radiographic studies, pulse oximetry and re-evaluation of patient's condition.  Initial Impression / Assessment and Plan / ED Course  I have reviewed the triage vital signs and the nursing notes.  Pertinent labs  & imaging results that were available during my care of the patient were reviewed by me and considered in my medical decision making (see chart for details).     Patient appears more comfortable though continues to have increased work of breathing after continuous nebulized treatment.  Normal lactic acid and white blood cell count.  Patient has bilateral infiltrates on x-ray.  Given fever to 102.7 we will treat for community-acquired pneumonia.  Also possible that this is related to influenza or CHF.  Will discuss with internal medicine teaching service regarding admission.  Teaching service will see and admit. Final Clinical Impressions(s) / ED Diagnoses   Final diagnoses:  Community acquired pneumonia, unspecified laterality  COPD exacerbation [facility])    ED Discharge Orders    None       Julianne Rice, MD 04/17/17 [phone]

## 2017-04-17 NOTE — ED Notes (Signed)
IV Team at the bedside. 

## 2017-04-17 NOTE — H&P (Signed)
Date: 04/17/2017               Patient Name:  Sharon Vasquez [name]: [id number]  DOB: 11-21-1950 Age / Sex: 67 y.o., female   PCP: Lucious Groves, DO         Medical Service: Internal Medicine Teaching Service         Attending Physician: Dr. Evette Doffing, Mallie Mussel, *    First Contact: Dr. Tarri Abernethy Pager: [phone]  Second Contact: Dr. Danford Bad Pager: [phone]       After Hours (After 5p/  First Contact Pager: [phone]  weekends / holidays): Second Contact Pager: [phone]   Chief Complaint: Cough, Shortness of breath  History of Present Illness:  Ms. Deak is a 67 year old female with a past medical history significant for non-oxygen dependent COPD PE/DVT on Xarelto, peripheral vascular disease, paroxsymal atrial fibrillation, heart failure with persevered ejection fraction, hypertension, tobacco abuse, morbid obesity presenting with 3 week history of shortness of breath and cough. She reports that 3 weeks ago she developed URI symptoms with sinus congestion, headache, rhinorrhea as well as cough with scant yellow mucous production. Her URI symptoms resolved but the cough has persisted and has developed shortness of breath. She notes worsening of her symptoms over the past 2-3 days with post-tussive emesis as well as diarrhea. She reports 4+ watery non-bloody bowel movement a day for the past several days. She denies any fevers at home but does note persistent chills. She also notes that 2 days ago she developed chest pain and it 'felt like someone was standing on her chest.' This has improved but she reports continued persistent substernal non-radiating chest pressure. She notes a separate pleuritic chest pain underneath her rib change but notes that these two pains are different.   In the ED, she was found to be febrile to 102.7 and tachycardic with increased work of breathing. Blood work has been unremarkable with CMP, CBC and lactate all within normal limits. Bilateral infiltrates noted on  chest x-ray suggestive of pneumonia. She was started on Levaquin in the ED for presumed community acquired pneumonia and IMTS paged for admission and continued care.   Meds:  Current Meds  Medication Sig  . albuterol (PROAIR HFA) 108 (90 Base) MCG/ACT inhaler Inhale 2 puffs into the lungs every 6 (six) hours as needed for wheezing or shortness of breath.  [name] albuterol (PROVENTIL) (2.5 MG/3ML) 0.083% nebulizer solution Take 3 mLs (2.5 mg total) by nebulization every 4 (four) hours as needed for wheezing or shortness of breath.  [name] amiodarone (PACERONE) 200 MG tablet TAKE 1 TABLET BY MOUTH EVERY DAY (Patient taking differently: 200MG  BY MOUTH ONCE DAILY)  . aspirin EC 81 MG tablet Take 81 mg by mouth daily.  [name] atorvastatin (LIPITOR) 40 MG tablet Take 1 tablet (40 mg total) by mouth daily.  . benazepril (LOTENSIN) 20 MG tablet Take 1 tablet (20 mg total) by mouth daily.  [name] DM-Doxylamine-Acetaminophen (NYQUIL COLD & FLU PO) Take 30 mLs by mouth at bedtime as needed (cough).  . DOK 100 MG capsule TK 1 C PO BID PRN FOR MODERATE CONSTIPATION  . esomeprazole (NEXIUM) 40 MG capsule Take 1 capsule (40 mg total) daily as needed by mouth (for heartburn or indigestion).  . Fluticasone-Salmeterol (ADVAIR DISKUS) 250-50 MCG/DOSE AEPB Inhale 1 puff into the lungs 2 (two) times daily. Rinse mouth after each use  . gabapentin (NEURONTIN) 400 MG capsule Take 1 capsule (400 mg total) by mouth daily. (  Patient taking differently: Take 400 mg by mouth 2 (two) times daily. )  . guaiFENesin (ROBITUSSIN) 100 MG/5ML liquid Take 200 mg by mouth 3 (three) times daily as needed for cough.  . potassium chloride (K-DUR,KLOR-CON) 10 MEQ tablet Take 1 tablet (10 mEq total) by mouth 2 (two) times daily.  . Pseudoephedrine-APAP-DM (DAYQUIL MULTI-SYMPTOM COLD/FLU PO) Take 15 mLs by mouth daily as needed (cough).  . rivaroxaban (XARELTO) 20 MG TABS tablet Take 1 tablet (20 mg total) by mouth every morning.  . tiotropium (SPIRIVA  HANDIHALER) 18 MCG inhalation capsule Place 1 capsule (18 mcg total) into inhaler and inhale daily.  [name] torsemide (DEMADEX) 20 MG tablet Take 1 tablet (20 mg total) by mouth 2 (two) times daily. (Patient taking differently: Take 20 mg by mouth daily as needed (fluid retention). )     Allergies: Allergies as of 04/17/2017  . (No Known Allergies)   Past Medical History:  Diagnosis Date  . A-fib ([place])   . Benign breast cyst in female, left 01/08/2017   Found by Screening mammogram, evaluated by U/S on 01/08/17 and determined to be a benign simple breast cyst.  . CHF (congestive heart failure) ([place])   . COPD (chronic obstructive pulmonary disease) ([place])   . Hyperlipidemia   . Hypertension   . Morbid obesity ([place])   . PE (pulmonary embolism)     Family History:  Family History  Problem Relation Age of Onset  . Breast cancer Mother   . Hypertension Mother   . Hyperlipidemia Mother   . Hypertension Maternal Grandfather   . Hyperlipidemia Maternal Grandfather    Social History:  Social History   Tobacco Use  . Smoking status: Current Some Day Smoker    Packs/day: 0.50    Years: 54.00    Pack years: 27.00    Types: Cigarettes    Start date: 08/16/1961  . Smokeless tobacco: Never Used  . Tobacco comment: smoking 3 cigarettes/ day  Substance Use Topics  . Alcohol use: No  . Drug use: No    Review of Systems: A complete ROS was negative except as per HPI.   Physical Exam: Blood pressure (!) 147/61, pulse 98, temperature 100 F (37.8 C), temperature source Oral, resp. rate (!) 24, height 6\' 2"  (1.88 m), weight (!) 320 lb (145.2 kg), SpO2 97 %. General: alert, well-developed, and cooperative to examination.  Head: normocephalic and atraumatic.  Eyes: vision grossly intact, pupils equal, pupils round, pupils reactive to light, no injection and anicteric.  Mouth: pharynx pink and moist, no erythema, and no exudates.  Neck: supple, full ROM, no thyromegaly, 1-2 cm JVD, and no  carotid bruits.  Lungs: mild increased work of breathing, decrease lung sounds at bilateral bases, no wheezes or rales Heart: tachycardic, regular rhythm, no murmur, no gallop, and no rub.  Abdomen: soft, non-tender, normal bowel sounds, no distention, no guarding, no rebound tenderness, Msk: no joint swelling, no joint warmth, and no redness over joints.  Pulses: 2+ DP/PT pulses bilaterally Extremities: 2+ edema with chronic venous stasis changes bilaterally Neurologic: alert & oriented X3,no focal deficits Skin: turgor normal and no rashes.  Psych: Normal mood and affect   EKG: personally reviewed my interpretation is sinus tachycardia,   CXR: personally reviewed my interpretation is airspace opacities on left  Assessment & Plan by Problem:  Influenza A: Patient with 3 week history of cough with productive yellow sputum and acute worsening with SOB in the past 3 days. Febrile to 102.7 and  tachycardic on presentation. Increased work of breathing and wheezing present on presentation but no hypoxia. Initial blood work unremarkable without leukocytosis or other abnormalities. Chest x-ray with possible early infiltrates vs CHF noted. She does not appear volume overloaded on exam and appears near her baseline weight. Initially treated as CAP in the ED with Levaquin. Flu panel returned positive for Influenza A. Will discontinue further ABX treatment currently. She is outside the window for treatment with Tamiflu. Will repeat chest xray with PA and Lateral in the morning. Checking procalcitonin. Treatment for now mainly supportive unless clinically worsening.  COPD with exacerbation secondary to Influenza: Wheezes and mild respiratory distress on presentation. Responded well to nebulizer treatments and solumedrol in the ED. No hypoxia noted. Continue home tiotropium and advair. Continue prednisone 40 mg daily x 5 days.   Chest pain: Substernal non-radiating chest pressure. EKG with new t-wave  inversion in V2. Initial trop in the ED 0.00. Will trend troponin. Most likely type II demand if trops become mildly elevated.   PVD with chronic lower extremity edema: On torsemide daily. Has a history of recurrent LE cellulitis. No evidence of infection currently.   Paroxsymal atrial fibrillation & history of DVT and PE:: CHADVASC score 5. Currently on Xarelto. On telemetry. Continue home xarelto and amiodarone.   ?HFpEF: TTE in 07/2016 with EF 5-80% and no disatolic dysfunction. She is on chronic torsemide for chronic leg edema 2/2 venous insufficiency. Continue home torsemide.    Dispo: Admit patient to Inpatient with expected length of stay greater than 2 midnights.  Signed: Maryellen Pile, MD 04/17/2017, 11:51 AM  Pager: [phone]

## 2017-04-18 ENCOUNTER — Inpatient Hospital Stay (HOSPITAL_COMMUNITY): Payer: Medicare Other

## 2017-04-18 DIAGNOSIS — I8312 Varicose veins of left lower extremity with inflammation: Secondary | ICD-10-CM

## 2017-04-18 DIAGNOSIS — I5030 Unspecified diastolic (congestive) heart failure: Secondary | ICD-10-CM

## 2017-04-18 DIAGNOSIS — J449 Chronic obstructive pulmonary disease, unspecified: Secondary | ICD-10-CM

## 2017-04-18 DIAGNOSIS — Z79899 Other long term (current) drug therapy: Secondary | ICD-10-CM

## 2017-04-18 DIAGNOSIS — Z86718 Personal history of other venous thrombosis and embolism: Secondary | ICD-10-CM

## 2017-04-18 DIAGNOSIS — F419 Anxiety disorder, unspecified: Secondary | ICD-10-CM

## 2017-04-18 DIAGNOSIS — J09X1 Influenza due to identified novel influenza A virus with pneumonia: Secondary | ICD-10-CM

## 2017-04-18 DIAGNOSIS — I739 Peripheral vascular disease, unspecified: Secondary | ICD-10-CM

## 2017-04-18 DIAGNOSIS — I8311 Varicose veins of right lower extremity with inflammation: Secondary | ICD-10-CM

## 2017-04-18 DIAGNOSIS — Z86711 Personal history of pulmonary embolism: Secondary | ICD-10-CM

## 2017-04-18 DIAGNOSIS — R0789 Other chest pain: Secondary | ICD-10-CM

## 2017-04-18 DIAGNOSIS — I480 Paroxysmal atrial fibrillation: Secondary | ICD-10-CM

## 2017-04-18 DIAGNOSIS — Z7901 Long term (current) use of anticoagulants: Secondary | ICD-10-CM

## 2017-04-18 LAB — BASIC METABOLIC PANEL
Anion gap: 12 (ref 5–15)
BUN: 11 mg/dL (ref 6–20)
CO2: 20 mmol/L — ABNORMAL LOW (ref 22–32)
Calcium: 9.2 mg/dL (ref 8.9–10.3)
Chloride: 104 mmol/L (ref 101–111)
Creatinine, Ser: 0.85 mg/dL (ref 0.44–1.00)
GFR calc Af Amer: >60 mL/min (ref >60)
GFR calc non Af Amer: >60 mL/min (ref >60)
Glucose, Bld: 153 mg/dL — ABNORMAL HIGH (ref 65–99)
Potassium: 3.7 mmol/L (ref 3.5–5.1)
Sodium: 136 mmol/L (ref 135–145)

## 2017-04-18 LAB — PROCALCITONIN: Procalcitonin: <0.1 ng/mL

## 2017-04-18 LAB — TROPONIN I: Troponin I: 0.06 ng/mL (ref <0.03)

## 2017-04-18 MED ORDER — OSELTAMIVIR PHOSPHATE 75 MG PO CAPS
75.0000 mg | ORAL_CAPSULE | Freq: Every day | ORAL | Status: DC
  Administered 2017-04-18 – 2017-04-19 (×2): 75 mg via ORAL
  Filled 2017-04-18 (×2): qty 1

## 2017-04-18 MED ORDER — FLUTICASONE PROPIONATE 50 MCG/ACT NA SUSP
2.0000 | Freq: Every day | NASAL | Status: DC | PRN
  Administered 2017-04-18 – 2017-04-19 (×2): 2 via NASAL
  Filled 2017-04-18: qty 16

## 2017-04-18 MED ORDER — GUAIFENESIN-CODEINE 100-10 MG/5ML PO SOLN
10.0000 mL | ORAL | Status: DC | PRN
  Administered 2017-04-18 (×3): 10 mL via ORAL
  Filled 2017-04-18 (×4): qty 10

## 2017-04-18 MED ORDER — SALINE SPRAY 0.65 % NA SOLN
1.0000 | NASAL | Status: DC | PRN
  Administered 2017-04-18: 1 via NASAL
  Filled 2017-04-18: qty 44

## 2017-04-18 MED ORDER — OXYMETAZOLINE HCL 0.05 % NA SOLN
1.0000 | Freq: Two times a day (BID) | NASAL | Status: DC | PRN
  Administered 2017-04-18: 1 via NASAL
  Filled 2017-04-18: qty 15

## 2017-04-18 NOTE — Progress Notes (Addendum)
Subjective: Pt reports that her cough is significantly bothering her, and she is unable to breathe properly due to the cough. She also reports that her anxiety is increased, as she is unable to take a deep breath. Her cough began 3 wks ago, but it began to worsen 5 days ago. Complains of sneezing, runny nose and myalgias today.  Objective: Vital signs in last 24 hours: Vitals:   04/17/17 2015 04/18/17 0011 04/18/17 0630 04/18/17 0824  BP:  (!) 176/93 (!) 172/106   Pulse: 90 79 74   Resp: 20 20    Temp:  98.3 F (36.8 C) 97.9 F (36.6 C)   TempSrc:  Oral Oral   SpO2: 97% 94% 93% 94%  Weight:      Height:       BP (!) 172/106 (BP Location: Right Arm)   Pulse 74   Temp 97.9 F (36.6 C) (Oral)   Resp 20   Ht 6\' 2"  (1.88 m)   Wt (!) 145.2 kg (320 lb)   SpO2 94%   BMI 41.09 kg/m   General Appearance:    Alert, in moderate distress, appears stated age  HEENT:    Normocephalic, EOM's intact, oral moist mucosa, nasal cannula in place.   Lungs:    Respirations moderately labored, exacerbated after coughing. Rhonchorous bilaterally with faint wheezing.    Heart:    Regular rate and rhythm  Abdomen:     Soft, non-tender to palpation of upper quadrants  Extremities:   Bilateral 2+ leg edema  Pulses:   2+ radial pulses   Skin:   Lipodermatosclerosis over lower legs.   Psychiatric:   Anxious and distressed.   Lab Results: Troponins x 3 (0.07, 0.07, 0.06)  Na - 136, K - 3.7, Cl - 104, CO2 - 20 (L) BUN - 11, Cr - 0.85, Ca - 9.2, Glucose - 153   Micro Results: Blood cultures x 2 (1/16) - pending Influenza PCR - Influenza A positive  Levofloxacin x 1 (1/15)  Tamiflu (1/16 - Present)  Studies/Results: Chest Xray (1/16)   No focal infiltrates seen.   Medications: I have reviewed the patient's current medications. Scheduled Meds: . amiodarone  200 mg Oral Daily  . aspirin EC  81 mg Oral Daily  . atorvastatin  40 mg Oral q1800  . benazepril  20 mg Oral Daily  . gabapentin   400 mg Oral Daily  . mometasone-formoterol  2 puff Inhalation BID  . oseltamivir  75 mg Oral Daily  . rivaroxaban  20 mg Oral Q supper  . sodium chloride flush  3 mL Intravenous Q12H  . sodium chloride flush  3 mL Intravenous Q12H  . tiotropium  18 mcg Inhalation Daily  . torsemide  20 mg Oral BID   Continuous Infusions: . sodium chloride     PRN Meds:.sodium chloride, acetaminophen **OR** acetaminophen, fluticasone, ipratropium-albuterol, ondansetron **OR** ondansetron (ZOFRAN) IV, oxymetazoline, sodium chloride, sodium chloride flush   Assessment/Plan: Pt is a 67yo female with a hx of COPD, PE/DVT on Xarelto, Afib, and HFpEF who presented on 1/15 with a 3-week history of SOB and cough that acutely worsened 5 days ago. She was febrile to 102.7 in the ED, and her workup has been remarkable for bilateral interstitial and alveolar thickening on CXR and a positive Influenza A test. She received 1 dose of Levaquin however she has been transitioned to Tamiflu today.  Influenza A, Viral Pneumonia Persistent non-productive cough not relieved with Robitussin. She endorses myalgias, sneezing and  runny nose today. Repeat CXR this AM still c/w viral pneumonia. Pct yesterday .26 however unable to find in chart, will re-order today. -DC steroids -Cough control -Tamiflu - Supportive treatment for now unless clinically worsening  Chest pain: Substernal non-radiating chest pressure on presentation. Troponins were mildly elevated, but stable x 3 at 0.06-0.07. ACS low on differential as her chest discomfort is more likely due to coughing spells  PVD with chronic lower extremity edema: Continue home Torsemide   Paroxsymal atrial fibrillation & history of DVT and PE:: - CHADVASC score 5.  - Continue home xarelto and amiodarone.   HFpEF: - TTE in 07/2016 with EF 95-74% , but diastolic dysfunction could not be assessed due to AF - Chronic leg edema 2/2 venous insufficiency  - Continue home Benazepril  and Lipitor   Dispo: Inpatient status. Will likely discharge home within the next 1-2 days.    LOS: 1 day   Annye Asa, Medical Student 04/18/2017, 10:35 AM     I have independently examined the patient and agree with the findings as documented by Annye Asa, MS4. We discussed the patient and formulated the assessment and plan as documented above with additions and clarifications by myself.   Einar Gip, DO Internal Medicine - PGY2 Pager [phone]

## 2017-04-18 NOTE — Progress Notes (Signed)
MD said since she already received Cough syrup with codiene that she should not switch to promethizine codiene syrup. The cough syrup she has now has been more effective.

## 2017-04-18 NOTE — Progress Notes (Signed)
OT Cancellation Note  Patient Details Name: Sharon Vasquez [name]: [id number] DOB: 10/19/1950   Cancelled Treatment:    Reason Eval/Treat Not Completed: Patient declined. Pt states she doesn't feel well and "isn't doing anything today"  [place], OT/L  [phone] 04/18/2017 04/18/2017, 10:14 AM

## 2017-04-18 NOTE — Progress Notes (Signed)
Talked to MD and she said to not give prednisone.

## 2017-04-18 NOTE — Progress Notes (Addendum)
Attestation for Student Documentation:  I personally was present and performed or re-performed the history, physical exam and medical decision-making activities of this service and have verified that the service and findings are accurately documented in the student's note.  67 year old woman admitted with influenza A pneumonia who so far has not made much progress on her symptoms. Still requiring 2L of [place], and has a severe non-productive cough that greatly agitates her. We are treating with tamiflu, bronchodilator treatments, and careful supportive cough suppression. Will try to get her up and moving tomorrow, wean supplemental oxygen, and walk with oxygen saturations. Hopefully home in 1-2 days.  Axel Filler, MD 04/18/2017, 4:10 PM

## 2017-04-18 NOTE — Progress Notes (Signed)
MD was paged per patient request in regards to wanting promethazine with codiene cough syrup. She said that has worked for her family members who have had the flu.

## 2017-04-18 NOTE — Progress Notes (Signed)
PT Cancellation Note  Patient Details Name: Sharon Vasquez [name]: [id number] DOB: 1950-08-18   Cancelled Treatment:    Reason Eval/Treat Not Completed: Patient declined.  Attempted to evaluate pt at 1000, patient adamantly refusing therapy citing headache and congestion. "I'm not moving". RN notified. Will re-attempt if time permits.  Reinaldo Berber, PT, DPT Acute Rehab Services Pager: [phone]     Reinaldo Berber 04/18/2017, 10:14 AM

## 2017-04-18 NOTE — Progress Notes (Signed)
MD was paged about whether or not to give the prednisone since it was just D/C and patient was not available earlier to administer it before 10 am, she was at a test.

## 2017-04-18 NOTE — Progress Notes (Signed)
Patient's blood pressure was high because she is having a coughing spell. I couldn't get one accurate enough since she could not go with out coughing long enough.

## 2017-04-19 ENCOUNTER — Telehealth: Payer: Self-pay

## 2017-04-19 MED ORDER — OSELTAMIVIR PHOSPHATE 75 MG PO CAPS: 75.0000 mg | ORAL_CAPSULE | Freq: Every day | ORAL | 0 refills | Status: AC

## 2017-04-19 MED ORDER — GUAIFENESIN-CODEINE 100-10 MG/5ML PO SOLN: 10.0000 mL | Freq: Four times a day (QID) | ORAL | 0 refills | Status: DC | PRN

## 2017-04-19 NOTE — Telephone Encounter (Signed)
TOC per Dr Danford Bad, discharge 04/19/2017, appt 04/26/2017.

## 2017-04-19 NOTE — Discharge Instructions (Signed)
Sharon Vasquez,  You were positive for the flu, which caused you to have a pneumonia that made your cough and breathing a lot more difficult. We started you on an anti-flu medication called Tamiflu that we would like you to continue for 3 more days (until Sunday) when you leave the hospital. We expect that you will continue to have some difficulty breathing, a cough, and even a fever for several days, even after you finish the Tamiflu. However, if you find that things start to get worse again, please call your primary care doctor or come back to the ED.

## 2017-04-19 NOTE — Telephone Encounter (Signed)
Continues to be inpt.

## 2017-04-19 NOTE — Progress Notes (Signed)
Pt requesting to take Promethazine with codeine in place of guaifenesin-codeine. Stated she was told earlier that she would be able to do this since this medication worked in the past for her family members. Contacted MD; MD declined request stating that guaifenesin-codeine is the most appropriate for pt's situation. Educated pt that what she is taking is very similar, minus some unnecessary ingredients and that MD believes this is the best option for her situation. Pt verbalized understanding and is willing to continue to try guaifenesin-codeine.

## 2017-04-19 NOTE — Progress Notes (Signed)
   Subjective: Cough much improved but persists. Was able to sleep after addition of codeine cough syrup. Requested promethazine-codeine cough syrup as her family recommended it, but patient doing well with current. Does not some abdominal soreness from coughing. Has been off  most of the night and did well with PT this morning.   Objective: Vital signs in last 24 hours: Vitals:   04/18/17 2219 04/19/17 0500 04/19/17 0639 04/19/17 0725  BP: (!) 157/77 (!) 154/76    Pulse: 79 (!) 115    Resp: 20 19    Temp: (!) 101.5 F (38.6 C) (!) 100.8 F (38.2 C) 99.1 F (37.3 C)   TempSrc: Oral Oral Oral   SpO2: 97% 94%    Weight:    (!) 150.7 kg (332 lb 3.7 oz)    Intake/Output Summary (Last 24 hours) at 04/19/2017 1038 Last data filed at 04/19/2017 0850 Gross per 24 hour  Intake 720 ml  Output 1600 ml  Net -880 ml   General Appearance:   Alert, sitting off the side of the bed, in no acute distress, appears stated age  HEENT:   Normocephalic, EOM's intact, oral moist mucosa  Lungs:    Breathing comfortably on RA, occasional coughing. Wheezing throughout all lung fields but improved.   Heart:   Regular rate and rhythm to auscultation.  Abdomen:    Soft, non-tender to palpation of upper quadrants    Assessment/Plan: Pt is a 67yo female with a hx of COPD, PE/DVT on Xarelto, Afib, and HFpEF who presented on 1/15 with a 3-week history of SOB and cough that acutely worsened on 1/11. She was febrile to 101.5 last night, and her workup has been remarkable for bilateral interstitial and alveolar thickening on CXR and a positive Influenza A test. She is on day 2 of Tamiflu and will be able to go home today to continue supportive therapy for influenza pneumonia.  Influenza A, Viral Pneumonia Persistent non-productive cough, malaise, and headache that have improved with Guafenesin-Codeine and Tamiflu. Repeat CXR on 1/16 still c/w viral pneumonia.  -Tamiflu (day 2/5) - can finish course  at home. Working with pharmacy to see if we can get her an affordable Rx of this and cough syrup -Will walk with PT to rule out ambulatory hypoxia  COPD -Wheezing still heard on exam that pt reports is her baseline -Continue home inhalers with DuoNeb prn  Chest pain: Substernal non-radiating chest pressure on presentation. Troponins were mildly elevated, but stable x 3 at 0.06-0.07. ACS low on differential as her chest discomfort is more likely due to coughing spells -Continue home Aspirin  PVD with chronic lower extremity edema:  -Having adequate PO intake. Continue home Torsemide   Paroxsymal atrial fibrillation& history of DVT and PE:: - CHADVASC score 5.  - Continue home xarelto and amiodarone.   HFpEF: - TTE in 07/2016 with EF 47-42%, but diastolic dysfunction could not be assessed due to AF - Chronic leg edema 2/2 venous insufficiency  - Continue home Benazepril, and Lipitor   Dispo: Inpatient status. Will likely discharge today pending ambulatory sats.    LOS: 2 days   Annye Asa, Medical Student 04/19/2017, 10:38 AM     I have independently examined the patient and agree with the findings as documented above by Annye Asa, MS4. Together we formulated the assessment and plan as outlined above with appropriate additions and corrections by myself.   Einar Gip, DO Internal Medicine - PGY2

## 2017-04-19 NOTE — Progress Notes (Signed)
Patient ambulated independently with SBA on R/A x 150 feet.  Sats = 93-96%.   Patient states has some SOB, but that this was not unusual for her.  Otherwise, no dizziness, pallor, or diaphoresis noted.

## 2017-04-19 NOTE — Evaluation (Signed)
Occupational Therapy Evaluation Patient Details Name: Sharon Vasquez [name]: [id number] DOB: 11-06-1950 Today's Date: 04/19/2017    History of Present Illness Pt is 67 year old woman with COPD, HFpEF, obesity, and atrial fibrillation on anticoagulation came to the ED because of worsening non-productive cough and shortness of breath over the last few days. She reports symptoms of dyspnea for 3 weeks, but is now having a pounding headache and cough that started just yesterday.  Diagnoseed with Afluenza.   Clinical Impression   Pt currently at modified independent to supervision level for selfcare tasks.  She has DME she used at home for mobility as well as bathing, and toileting.  Still with some SOB and need for energy conservation strategies with self care.  Also recommended more AE to assist with dressing as pt does exhibit some difficulty reaching her feet, and it can be very energy consuming.  Handouts provided for reference and education on energy conservation and AE.  No further OT needs at this time.      Follow Up Recommendations  No OT follow up;Supervision - Intermittent    Equipment Recommendations  Other (comment)(reacher, LH sponge)       Precautions / Restrictions Precautions Precautions: Fall Restrictions Weight Bearing Restrictions: No      Mobility Bed Mobility Overal bed mobility: Modified Independent                Transfers Overall transfer level: Needs assistance   Transfers: Sit to/from Stand           General transfer comment: Pt completed sit to stand from the EOB with supervision and then ambulated only a few feet to the sink for bathing tasks.  She states that she walks very little at home secondary to bilateral knee pain/arthritis.    Balance Overall balance assessment: Needs assistance   Sitting balance-Leahy Scale: Good       Standing balance-Leahy Scale: Fair                             ADL either performed or assessed  with clinical judgement   ADL Overall ADL's : At baseline                                       General ADL Comments: Pt currently modified independent level for selfcare tasks simulated and perforned.  She is currently using a shower bench for bathing and toilet riser for toilet transfers.  She reported not using an assistive device in the apartment but always when she goes down to the community room or out of the building.  She reports that her family will bring her groceries when she needs them but she does not cook a lot, just uses the microwave.  Educated pt on the need for a reacher and LH sponge as she already has a sockaide.  Also provided handout on energy conservation strategies for selfcare.       Vision Baseline Vision/History: Wears glasses Wears Glasses: At all times Patient Visual Report: No change from baseline Vision Assessment?: No apparent visual deficits            Pertinent Vitals/Pain Pain Assessment: No/denies pain        Extremity/Trunk Assessment Upper Extremity Assessment Upper Extremity Assessment: Overall WFL for tasks assessed   Lower Extremity Assessment Lower Extremity Assessment: Defer to PT  evaluation   Cervical / Trunk Assessment Cervical / Trunk Assessment: Normal   Communication Communication Communication: No difficulties   Cognition Arousal/Alertness: Awake/alert Behavior During Therapy: WFL for tasks assessed/performed Overall Cognitive Status: Within Functional Limits for tasks assessed                                                Home Living Family/patient expects to be discharged to:: Private residence Living Arrangements: Alone Available Help at Discharge: Family(Children and ex-husband check in) Type of Home: Apartment Home Access: [place]: One level     Bathroom Shower/Tub: Teacher, early years/pre: Standard     Home Equipment: Environmental consultant - 4 wheels;Bedside  commode;Tub bench;Grab bars - tub/shower;Other (comment)(electric scooter)          Prior Functioning/Environment Level of Independence: Independent with assistive device(s)        Comments: Pt does not drive.  She used her scooter or walker outside of her apartment, but nothing in the apartment.                                  AM-PAC PT "6 Clicks" Daily Activity     Outcome Measure Help from another person eating meals?: None Help from another person taking care of personal grooming?: None Help from another person toileting, which includes using toliet, bedpan, or urinal?: None Help from another person bathing (including washing, rinsing, drying)?: A Little Help from another person to put on and taking off regular upper body clothing?: None Help from another person to put on and taking off regular lower body clothing?: None 6 Click Score: 23   End of Session Nurse Communication: Other (comment)(That pt is sitting at the sink for bathing.  She was not comfortable with female OT being in the room for this task.)  Activity Tolerance: Patient tolerated treatment well Patient left: Other (comment)(sitting on elevated 3:1 at the sink for bathing)                   Time: 7564-3329 OT Time Calculation (min): 27 min Charges:  OT General Charges $OT Visit: 1 Visit OT Evaluation $OT Eval Moderate Complexity: 1 Mod OT Treatments $Self Care/Home Management : 8-22 mins  Catha Ontko OTR/L 04/19/2017, 9:53 AM

## 2017-04-19 NOTE — Discharge Summary (Signed)
Name: Sharon Vasquez [name]: [id number] DOB: 05-27-1950 67 y.o. PCP: Lucious Groves, DO  Date of Admission: 04/17/2017  7:44 AM Date of Discharge: 04/18/2017 Attending Physician: Axel Filler, *  Discharge Diagnosis: 1. Influenza A 2. Viral Pneumonia   Discharge Medications: Allergies as of 04/19/2017   No Known Allergies     Medication List    TAKE these medications   albuterol 108 (90 Base) MCG/ACT inhaler Commonly known as:  PROAIR HFA Inhale 2 puffs into the lungs every 6 (six) hours as needed for wheezing or shortness of breath.   albuterol (2.5 MG/3ML) 0.083% nebulizer solution Commonly known as:  PROVENTIL Take 3 mLs (2.5 mg total) by nebulization every 4 (four) hours as needed for wheezing or shortness of breath.   amiodarone 200 MG tablet Commonly known as:  PACERONE TAKE 1 TABLET BY MOUTH EVERY DAY What changed:    how much to take  how to take this  when to take this   aspirin EC 81 MG tablet Take 81 mg by mouth daily.   atorvastatin 40 MG tablet Commonly known as:  LIPITOR Take 1 tablet (40 mg total) by mouth daily.   benazepril 20 MG tablet Commonly known as:  LOTENSIN Take 1 tablet (20 mg total) by mouth daily.   DAYQUIL MULTI-SYMPTOM COLD/FLU PO Take 15 mLs by mouth daily as needed (cough).   DOK 100 MG capsule Generic drug:  docusate sodium TK 1 C PO BID PRN FOR MODERATE CONSTIPATION   esomeprazole 40 MG capsule Commonly known as:  NEXIUM Take 1 capsule (40 mg total) daily as needed by mouth (for heartburn or indigestion).   Fluticasone-Salmeterol 250-50 MCG/DOSE Aepb Commonly known as:  ADVAIR DISKUS Inhale 1 puff into the lungs 2 (two) times daily. Rinse mouth after each use   gabapentin 400 MG capsule Commonly known as:  NEURONTIN Take 1 capsule (400 mg total) by mouth daily. What changed:  when to take this   guaiFENesin 100 MG/5ML liquid Commonly known as:  ROBITUSSIN Take 200 mg by mouth 3 (three) times daily as  needed for cough.   guaiFENesin-codeine 100-10 MG/5ML syrup Take 10 mLs by mouth every 6 (six) hours as needed for cough.   nicotine 21 mg/24hr patch Commonly known as:  NICODERM CQ - dosed in mg/24 hours Place 1 patch (21 mg total) onto the skin daily.   NYQUIL COLD & FLU PO Take 30 mLs by mouth at bedtime as needed (cough).   oseltamivir 75 MG capsule Commonly known as:  TAMIFLU Take 1 capsule (75 mg total) by mouth daily for 3 days. Start taking on:  04/20/2017   potassium chloride 10 MEQ tablet Commonly known as:  K-DUR,KLOR-CON Take 1 tablet (10 mEq total) by mouth 2 (two) times daily.   rivaroxaban 20 MG Tabs tablet Commonly known as:  XARELTO Take 1 tablet (20 mg total) by mouth every morning.   senna-docusate 8.6-50 MG tablet Commonly known as:  Senokot-S Take 1 tablet by mouth 2 (two) times daily.   tiotropium 18 MCG inhalation capsule Commonly known as:  SPIRIVA HANDIHALER Place 1 capsule (18 mcg total) into inhaler and inhale daily.   torsemide 20 MG tablet Commonly known as:  DEMADEX Take 1 tablet (20 mg total) by mouth 2 (two) times daily. What changed:    when to take this  reasons to take this       Disposition and follow-up:   Ms.Kanai D Hoopes was discharged from [facility] in fair condition.  At the hospital follow up visit please address:  1.  Improvement in her cough and breathing difficulty 2.  If she was able to obtain her prescribed Tamiflu medication.    Follow-up Appointments: Dr. Joni Reining 04/26/17 @ 8:15 AM  Hospital Course by problem list: 1. Influenza A, Viral Pneumonia Pt presented with a 3-week history of SOB and cough that acutely worsened 5 days before preesentation. She was febrile to 102.7 in the ED, and her workup was remarkable for bilateral interstitial and alveolar thickening on CXR and a positive Influenza A test. She received 1 dose of Levaquin and IV steroids in the ED for a presumed COPD  exacerbation, but after her positive flu test, she was started on Tamiflu on 1/16. Her cough and difficulty breathing had significantly improved before discharge with Guafenesin-Codeine syrup, which she was prescribed for supportive treatment at home. She was also prescribed 3 more daily doses of Tamiflu to continue for a full 5-day course.    Discharge Physical Exam:   BP (!) 154/76 (BP Location: Left Arm)   Pulse (!) 115   Temp 99.1 F (37.3 C) (Oral)   Resp 19   Ht 6\' 2"  (1.88 m)   Wt (!) 150.7 kg (332 lb 3.7 oz)   SpO2 94%   BMI 42.66 kg/m   General Appearance:  Alert, sitting off the side of the bed, in no acute distress, appears stated age  HEENT:  Normocephalic, EOM's intact, oral moist mucosa  Lungs: Mildly labored respirations, exacerbatedwithcoughing and repositioning.Wheezing throughout all lung fields.   Heart:  Regular rate and rhythm to auscultation.  Abdomen:  Soft, non-tender to palpation of upper quadrants       Pertinent Labs, Studies, and Procedures:  Influenza PCR - Influenza A positive  Chest XR (1/15) The findings suggest CHF with mild interstitial and early alveolar edema. One cannot exclude pneumonia in the appropriate clinical setting.   Discharge Instructions: Ms. Darlena, Koval were positive for the flu, which caused you to have a pneumonia that made your cough and breathing a lot more difficult. We started you on an anti-flu medication called Tamiflu that we would like you to continue for 3 more days (until Sunday) when you leave the hospital. We expect that you will continue to have some difficulty breathing, a cough, and even a fever for several more days, even after you finish the Tamiflu. However, if you find that things start to get worse again, please call your primary care doctor or come back to the ED.   Signed: Annye Asa, Medical Student 04/19/2017, 8:04 AM   Pager: [phone]

## 2017-04-19 NOTE — Evaluation (Signed)
Physical Therapy Evaluation Patient Details Name: Sharon Vasquez [name]: [id number] DOB: 1951/03/11 Today's Date: 04/19/2017   History of Present Illness  Pt is a 67 y/o female with PMH significant for COPD, HFpEF, obesity, and atrial fibrillation admitted with worsening cough and SOB, (+) influ A  Clinical Impression  Pt currently performing all mobility with mod I or better.  Pt assisted PT with making her bed with clean sheets.  Discussed home setting and PLOF as well as energy conservation techniques for maximizing independence once d/c'd home.  No f/u recommended.     Follow Up Recommendations No PT follow up    Equipment Recommendations  None recommended by PT    Recommendations for Other Services       Precautions / Restrictions Precautions Precautions: None Precaution Comments: watch O2 sats Restrictions Weight Bearing Restrictions: No      Mobility  Bed Mobility Overal bed mobility: Modified Independent                Transfers Overall transfer level: Modified independent Equipment used: None Transfers: Sit to/from Stand Sit to Stand: Modified independent (Device/Increase time)         General transfer comment: Pt completed sit to stand from the EOB with supervision and then ambulated only a few feet to the sink for bathing tasks.  She states that she walks very little at home secondary to bilateral knee pain/arthritis.  Ambulation/Gait Ambulation/Gait assistance: Modified independent (Device/Increase time) Ambulation Distance (Feet): (ambulated within room to make her bed) Assistive device: None Gait Pattern/deviations: Decreased step length - right;Decreased step length - left;Wide base of support;Antalgic     General Gait Details: prior history of bilateral knee OA, reports not a candidate for surgery  Stairs            Wheelchair Mobility    Modified Rankin (Stroke Patients Only)       Balance Overall balance assessment: Needs  assistance   Sitting balance-Leahy Scale: Good       Standing balance-Leahy Scale: Good                               Pertinent Vitals/Pain Pain Assessment: No/denies pain    Home Living Family/patient expects to be discharged to:: Private residence Living Arrangements: Alone Available Help at Discharge: Family(has 3 children and an ex-husband available for PRN assist ) Type of Home: Apartment Home Access: Elevator     Home Layout: One level Home Equipment: Walker - 4 wheels;Bedside commode;Tub bench;Electric scooter Additional Comments: uses no AD within her apartment, but uses scooter/rollator for mobility outside her apartment door    Prior Function Level of Independence: Independent with assistive device(s)         Comments: Pt does not drive.  She used her scooter or walker outside of her apartment, but nothing in the apartment.     Hand Dominance        Extremity/Trunk Assessment   Upper Extremity Assessment Upper Extremity Assessment: Overall WFL for tasks assessed    Lower Extremity Assessment Lower Extremity Assessment: Overall WFL for tasks assessed    Cervical / Trunk Assessment Cervical / Trunk Assessment: Normal  Communication   Communication: No difficulties  Cognition Arousal/Alertness: Awake/alert Behavior During Therapy: WFL for tasks assessed/performed Overall Cognitive Status: Within Functional Limits for tasks assessed  General Comments      Exercises     Assessment/Plan    PT Assessment Patent does not need any further PT services  PT Problem List         PT Treatment Interventions      PT Goals (Current goals can be found in the Care Plan section)  Acute Rehab PT Goals Patient Stated Goal: to go home in the next few days PT Goal Formulation: With patient    Frequency     Barriers to discharge        Co-evaluation               AM-PAC PT  "6 Clicks" Daily Activity  Outcome Measure Difficulty turning over in bed (including adjusting bedclothes, sheets and blankets)?: A Little Difficulty moving from lying on back to sitting on the side of the bed? : A Little Difficulty sitting down on and standing up from a chair with arms (e.g., wheelchair, bedside commode, etc,.)?: None Help needed moving to and from a bed to chair (including a wheelchair)?: None Help needed walking in hospital room?: A Little Help needed climbing 3-5 steps with a railing? : A Little 6 Click Score: 20    End of Session   Activity Tolerance: Patient limited by fatigue Patient left: in chair;with call bell/phone within reach Nurse Communication: Mobility status      Time: 8003-4917 PT Time Calculation (min) (ACUTE ONLY): 16 min   Charges:   PT Evaluation $PT Eval Moderate Complexity: 1 Mod     PT G Codes:         Michel Santee 04/19/2017, 10:10 AM

## 2017-04-20 NOTE — Telephone Encounter (Signed)
Transition Care Management Follow-up Telephone Call   Date discharged?"yesterday"   How have you been since you were released from the hospital?    Do you understand why you were in the hospital? yes   Do you understand the discharge instructions? yes   Where were you discharged to? home   Items Reviewed:  Medications reviewed: yes  Allergies reviewed: yes  Dietary changes reviewed: yes  Referrals reviewed: yes   Functional Questionnaire:   Activities of Daily Living (ADLs):   She states they are independent in the following: "my daughter helps me" States they require assistance with the following: "my daughter helps me"   Any transportation issues/concerns?: no   Any patient concerns? no   Confirmed importance and date/time of follow-up visits scheduled yes  Provider Appointment booked with1/24- dr Heber Jaconita  Confirmed with patient if condition begins to worsen call PCP or go to the ER.  Patient was given the office number and encouraged to call back with question or concerns.  : yes

## 2017-04-22 LAB — CULTURE, BLOOD (ROUTINE X 2)
Culture: NO GROWTH
Culture: NO GROWTH
Special Requests: ADEQUATE
Special Requests: ADEQUATE

## 2017-04-25 ENCOUNTER — Other Ambulatory Visit: Payer: Self-pay | Admitting: *Deleted

## 2017-04-25 ENCOUNTER — Encounter: Payer: Self-pay | Admitting: Internal Medicine

## 2017-04-25 MED ORDER — ALBUTEROL SULFATE HFA 108 (90 BASE) MCG/ACT IN AERS: 2.0000 | INHALATION_SPRAY | Freq: Four times a day (QID) | RESPIRATORY_TRACT | 1 refills | Status: DC | PRN

## 2017-04-25 NOTE — Progress Notes (Signed)
Subjective:  HPI: Sharon.Sharon Vasquez is a 67 y.o. female who presents for HFU of influenza and chronic venous insufficency  Please see Assessment and Plan below for the status of her chronic medical problems.  Review of Systems: Review of Systems  Constitutional: Negative for weight loss.  Respiratory: Negative for cough and shortness of breath.   Cardiovascular: Positive for leg swelling. Negative for chest pain.  Gastrointestinal: Negative for abdominal pain, constipation and diarrhea.  Musculoskeletal: Negative for falls.  Neurological: Negative for headaches.  Psychiatric/Behavioral: The patient is nervous/anxious and has insomnia.     Objective:  Physical Exam: Vitals:   04/26/17 0815  BP: (!) 166/99  Pulse: 63  Temp: 98 F (36.7 C)  TempSrc: Oral  SpO2: 96%  Weight: (!) 315 lb 14.4 oz (143.3 kg)  Height: 6\' 2"  (1.88 m)   Physical Exam  Constitutional:  Sitting on rollator walker with bench.  Able to stand easily and pace around room (at end of visit) without use of walker.  Cardiovascular: Normal rate and regular rhythm.  Pulmonary/Chest: Breath sounds normal. No respiratory distress. She has no wheezes.  Musculoskeletal: She exhibits edema (2+ at ankles bialterally).  Neurological: Gait normal.  Psychiatric: Her mood appears anxious. Her affect is labile. She expresses impulsivity. She expresses no suicidal ideation.  Frequently blunt and labile.  In middle of visit, as I was talking she started to call her pharmacy (appearently to see if albuterol prescription which I told her was sent the day prior was there)  Nursing note and vitals reviewed.  Assessment & Plan:  Chronic venous insufficiency HPI: Since our last visit Sharon Vasquez has seen Dr Scot Dock, I have reviewed his note from 03/21/17 which confirms her bilateral chronic venous insufficiency however he feels that her left venous stents are stable and there is no need for any acute intervention.  He recommended leg  elevatation.  Sharon Vasquez reports to me that she is frustrated by that referral because "he didn't do anything," and "just told me what I already know."  She reports to me that she is in 9/10 pain today in her bilateral lower legs.  She notes that when she elevates her legs the pain improves. She continues to repeatedly state that she knows her body and has found something that works for her pain : oxycodone.  She reports that she wants to have oxycodone available to use as needed so she can "go out."  She is not using compression stockings.  A: Chronic venous insufficnecy due to Peripheral vascular disease s/p left common iliac and external iliac vein stenting.  P: I again tried to empathize with Sharon Vasquez, however she still remains very fixated on opioid use.  I discussed that I do not feel comfortable starting long term opioid therapy for pain associated with chronic venous stasis, I discussed again the recommendations of Dr Scot Dock as well as myself that leg elevation is the treatment. I stated that she wanted to try something. In that case I discussed we could start Cymbalta for chronic pain.  I disucssed this medication and dosing instructions.  I think this may be benefital as I suspect she may also have a component of major depression. She has a referral in place for pain management as well and an appointment for February.  She expressed frustration to me that she was sent information about Opioid use disorder.  I discussed that I did not refer her for that indication but I recommend that she read the material  as it may be informative of the dangers of opioids.  Benign essential HTN HPI: She reports adherence to benazepirl, did not take medication this morning.  Reports BP is high due to pain.  A: Essential HTN, above goal  P: BP is again above goal, she has reported chronic pain at last several visits.  I discussed that it would be beneficial to increase to Benazepril 40mg  daily.  I am not sure if  she will do this. After I declined to prescribe oxycodone she crossed her arms and informed me that "you're the doctor," I again stated my recommendations and provided a prescription for the increased dose.  COPD (chronic obstructive pulmonary disease) (HCC) HPI: Reports she has not fully recovered from the flu.  Mainly appears upset that cough syrup was not affordable ($20 dollars).  No coughing during encounter and able to speak in extended sentences without objective SOB.  A: COPD- stable P: Continue Spiriva daily.  Major depression, recurrent, chronic (HCC) HPI: She has a history of depression.  She scored a 0 on PHQ2 today, however in talking with her she does admit depression to me, overall review of PHQ scores highly variable with last recorded PHQ9 of 23.  Additionally she admited depression of ROS at vascular surgery.  She does admit insomnia, as well as decreased appetite.  Denies anhedonia reports she cant do actives due to leg pains. Denies SI  A: MDD-moderate  P: As discussed in A/P for Venous insufficiency I will start duloxetine today I think this may help both chronic pain and depression.  (HFpEF) heart failure with preserved ejection fraction (HCC) -Stable  PAF (paroxysmal atrial fibrillation) (HCC) -Stable on Amiodarone and Xarelto.   Medications Ordered Meds ordered this encounter  Medications  . DULoxetine (CYMBALTA) 30 MG capsule    Sig: Take 1 capsule (30 mg total) by mouth daily for 7 days, THEN 2 capsules (60 mg total) daily.    Dispense:  60 capsule    Refill:  1  . benazepril (LOTENSIN) 40 MG tablet    Sig: Take 1 tablet (40 mg total) by mouth daily.    Dispense:  90 tablet    Refill:  3   Other Orders No orders of the defined types were placed in this encounter.  Follow Up: No Follow-up on file.

## 2017-04-26 ENCOUNTER — Ambulatory Visit (INDEPENDENT_AMBULATORY_CARE_PROVIDER_SITE_OTHER): Payer: Medicare Other | Admitting: Internal Medicine

## 2017-04-26 ENCOUNTER — Encounter: Payer: Self-pay | Admitting: Internal Medicine

## 2017-04-26 ENCOUNTER — Other Ambulatory Visit: Payer: Self-pay

## 2017-04-26 VITALS — BP 166/99 | HR 63 | Temp 98.0°F | Ht 74.0 in | Wt 315.9 lb

## 2017-04-26 DIAGNOSIS — J449 Chronic obstructive pulmonary disease, unspecified: Secondary | ICD-10-CM | POA: Diagnosis not present

## 2017-04-26 DIAGNOSIS — F339 Major depressive disorder, recurrent, unspecified: Secondary | ICD-10-CM

## 2017-04-26 DIAGNOSIS — I480 Paroxysmal atrial fibrillation: Secondary | ICD-10-CM | POA: Diagnosis not present

## 2017-04-26 DIAGNOSIS — I110 Hypertensive heart disease with heart failure: Secondary | ICD-10-CM | POA: Diagnosis not present

## 2017-04-26 DIAGNOSIS — G8929 Other chronic pain: Secondary | ICD-10-CM

## 2017-04-26 DIAGNOSIS — I872 Venous insufficiency (chronic) (peripheral): Secondary | ICD-10-CM

## 2017-04-26 DIAGNOSIS — Z95820 Peripheral vascular angioplasty status with implants and grafts: Secondary | ICD-10-CM | POA: Diagnosis not present

## 2017-04-26 DIAGNOSIS — F17210 Nicotine dependence, cigarettes, uncomplicated: Secondary | ICD-10-CM | POA: Diagnosis not present

## 2017-04-26 DIAGNOSIS — Z7901 Long term (current) use of anticoagulants: Secondary | ICD-10-CM

## 2017-04-26 DIAGNOSIS — I739 Peripheral vascular disease, unspecified: Secondary | ICD-10-CM | POA: Diagnosis not present

## 2017-04-26 DIAGNOSIS — I5030 Unspecified diastolic (congestive) heart failure: Secondary | ICD-10-CM

## 2017-04-26 DIAGNOSIS — Z79899 Other long term (current) drug therapy: Secondary | ICD-10-CM

## 2017-04-26 DIAGNOSIS — I10 Essential (primary) hypertension: Secondary | ICD-10-CM

## 2017-04-26 MED ORDER — DULOXETINE HCL 30 MG PO CPEP: ORAL_CAPSULE | ORAL | 1 refills | Status: DC

## 2017-04-26 MED ORDER — BENAZEPRIL HCL 40 MG PO TABS: 40.0000 mg | ORAL_TABLET | Freq: Every day | ORAL | 3 refills | Status: DC

## 2017-04-26 NOTE — Patient Instructions (Signed)
I want you to start taking cymbalta for your pain.  You will start 30mg  a day.  After 1 week you can increase to 60mg  a day.

## 2017-04-27 NOTE — Assessment & Plan Note (Signed)
HPI: She has a history of depression.  She scored a 0 on PHQ2 today, however in talking with her she does admit depression to me, overall review of PHQ scores highly variable with last recorded PHQ9 of 23.  Additionally she admited depression of ROS at vascular surgery.  She does admit insomnia, as well as decreased appetite.  Denies anhedonia reports she cant do actives due to leg pains. Denies SI  A: MDD-moderate  P: As discussed in A/P for Venous insufficiency I will start duloxetine today I think this may help both chronic pain and depression.

## 2017-04-27 NOTE — Assessment & Plan Note (Signed)
HPI: Reports she has not fully recovered from the flu.  Mainly appears upset that cough syrup was not affordable ($20 dollars).  No coughing during encounter and able to speak in extended sentences without objective SOB.  A: COPD- stable P: Continue Spiriva daily.

## 2017-04-27 NOTE — Assessment & Plan Note (Signed)
Stable

## 2017-04-27 NOTE — Assessment & Plan Note (Signed)
HPI: She reports adherence to benazepirl, did not take medication this morning.  Reports BP is high due to pain.  A: Essential HTN, above goal  P: BP is again above goal, she has reported chronic pain at last several visits.  I discussed that it would be beneficial to increase to Benazepril 40mg  daily.  I am not sure if she will do this. After I declined to prescribe oxycodone she crossed her arms and informed me that "you're the doctor," I again stated my recommendations and provided a prescription for the increased dose.

## 2017-04-27 NOTE — Assessment & Plan Note (Signed)
>>  ASSESSMENT AND PLAN FOR DIASTOLIC HEART FAILURE (HCC) WRITTEN ON 04/27/2017 11:01 AM BY HOFFMAN, ERIK C, DO  -Stable

## 2017-04-27 NOTE — Assessment & Plan Note (Signed)
HPI: Since our last visit Sharon Vasquez has seen Dr Scot Dock, I have reviewed his note from 03/21/17 which confirms her bilateral chronic venous insufficiency however he feels that her left venous stents are stable and there is no need for any acute intervention.  He recommended leg elevatation.  Sharon Vasquez reports to me that she is frustrated by that referral because "he didn't do anything," and "just told me what I already know."  She reports to me that she is in 9/10 pain today in her bilateral lower legs.  She notes that when she elevates her legs the pain improves. She continues to repeatedly state that she knows her body and has found something that works for her pain : oxycodone.  She reports that she wants to have oxycodone available to use as needed so she can "go out."  She is not using compression stockings.  A: Chronic venous insufficnecy due to Peripheral vascular disease s/p left common iliac and external iliac vein stenting.  P: I again tried to empathize with Sharon Vasquez, however she still remains very fixated on opioid use.  I discussed that I do not feel comfortable starting long term opioid therapy for pain associated with chronic venous stasis, I discussed again the recommendations of Dr Scot Dock as well as myself that leg elevation is the treatment. I stated that she wanted to try something. In that case I discussed we could start Cymbalta for chronic pain.  I disucssed this medication and dosing instructions.  I think this may be benefital as I suspect she may also have a component of major depression. She has a referral in place for pain management as well and an appointment for February.  She expressed frustration to me that she was sent information about Opioid use disorder.  I discussed that I did not refer her for that indication but I recommend that she read the material as it may be informative of the dangers of opioids.

## 2017-04-27 NOTE — Assessment & Plan Note (Signed)
-  Stable on Amiodarone and Xarelto.

## 2017-04-30 ENCOUNTER — Other Ambulatory Visit: Payer: Self-pay | Admitting: *Deleted

## 2017-05-01 MED ORDER — ALBUTEROL SULFATE (2.5 MG/3ML) 0.083% IN NEBU: 2.5000 mg | INHALATION_SOLUTION | RESPIRATORY_TRACT | 2 refills | Status: DC | PRN

## 2017-05-15 ENCOUNTER — Telehealth: Payer: Self-pay

## 2017-05-15 NOTE — Telephone Encounter (Signed)
ERROR

## 2017-05-16 ENCOUNTER — Telehealth: Payer: Self-pay | Admitting: Internal Medicine

## 2017-05-16 NOTE — Telephone Encounter (Signed)
Patient calling back, regarding x-ray

## 2017-05-16 NOTE — Telephone Encounter (Signed)
What X ray? The last one was done in the hospital.

## 2017-05-18 ENCOUNTER — Other Ambulatory Visit: Payer: Self-pay

## 2017-05-18 ENCOUNTER — Ambulatory Visit (INDEPENDENT_AMBULATORY_CARE_PROVIDER_SITE_OTHER): Payer: Medicare Other | Admitting: Internal Medicine

## 2017-05-18 ENCOUNTER — Ambulatory Visit (HOSPITAL_COMMUNITY)
Admission: RE | Admit: 2017-05-18 | Discharge: 2017-05-18 | Disposition: A | Discharge status: Home / Self Care | Payer: Medicare Other | Source: Ambulatory Visit | Attending: Internal Medicine | Admitting: Internal Medicine

## 2017-05-18 VITALS — BP 190/121 | HR 61 | Temp 97.9°F | Ht 74.0 in | Wt 322.5 lb

## 2017-05-18 DIAGNOSIS — I739 Peripheral vascular disease, unspecified: Secondary | ICD-10-CM | POA: Diagnosis not present

## 2017-05-18 DIAGNOSIS — R531 Weakness: Secondary | ICD-10-CM | POA: Diagnosis not present

## 2017-05-18 DIAGNOSIS — Z7901 Long term (current) use of anticoagulants: Secondary | ICD-10-CM

## 2017-05-18 DIAGNOSIS — I872 Venous insufficiency (chronic) (peripheral): Secondary | ICD-10-CM

## 2017-05-18 DIAGNOSIS — M25552 Pain in left hip: Secondary | ICD-10-CM

## 2017-05-18 DIAGNOSIS — Z95820 Peripheral vascular angioplasty status with implants and grafts: Secondary | ICD-10-CM | POA: Diagnosis not present

## 2017-05-18 DIAGNOSIS — E669 Obesity, unspecified: Secondary | ICD-10-CM

## 2017-05-18 DIAGNOSIS — I10 Essential (primary) hypertension: Secondary | ICD-10-CM

## 2017-05-18 DIAGNOSIS — M779 Enthesopathy, unspecified: Secondary | ICD-10-CM | POA: Insufficient documentation

## 2017-05-18 DIAGNOSIS — Z79899 Other long term (current) drug therapy: Secondary | ICD-10-CM | POA: Diagnosis not present

## 2017-05-18 DIAGNOSIS — Z6841 Body Mass Index (BMI) 40.0 and over, adult: Secondary | ICD-10-CM | POA: Diagnosis not present

## 2017-05-18 NOTE — Telephone Encounter (Signed)
Pt being seen today

## 2017-05-18 NOTE — Patient Instructions (Addendum)
Ms. Thall,   We cannot prescribe medications at this time. Please follow up with the pain clinic on 2/23.   Please avoid medications containing Ibuprofen such as Advil, Naproxen, Motrin, BC/Good powder. You are on Xarelto and this will increase your risk of bleeding.   Please call us if you have any questions.

## 2017-05-19 ENCOUNTER — Encounter: Payer: Self-pay | Admitting: Internal Medicine

## 2017-05-19 ENCOUNTER — Telehealth: Payer: Self-pay | Admitting: Internal Medicine

## 2017-05-19 NOTE — Progress Notes (Signed)
   CC: LLE pain follow up   HPI:  [name] is a 67 y.o. female with PMH listed below who presents to for left lower extremity pain follow-up.  Please see problem based assessment and plan for further details.  Past Medical History:  Diagnosis Date  . A-fib ([place])   . Anxiety   . Arthritis    "qwhre; joints, back" (04/17/2017)  . Benign breast cyst in female, left 01/08/2017   Found by Screening mammogram, evaluated by [facility] on 01/08/17 and determined to be a benign simple breast cyst.  . CHF (congestive heart failure) ([place])   . Chronic lower back pain   . COPD (chronic obstructive pulmonary disease) ([place])   . Depression   . DVT (deep venous thrombosis) ([place]) 11/16/2016  . GERD (gastroesophageal reflux disease)   . Headache    "weekly for the last 3 months" (04/17/2017)  . Hyperlipidemia   . Hypertension   . Morbid obesity ([place])   . PE (pulmonary embolism)   . Pulmonary embolism ([place]) 09/21/2014   Review of Systems:   Review of Systems  Constitutional: Negative for chills, fever and malaise/fatigue.  Musculoskeletal: Positive for joint pain and myalgias.  Neurological: Positive for weakness.    Physical Exam:  Vitals:   05/18/17 1501 05/18/17 1502  BP: (!) 193/111 (!) 190/121  Pulse: 68 61  Temp: 97.9 F (36.6 C)   TempSrc: Oral   SpO2: 96%   Weight: (!) 322 lb 8 oz (146.3 kg)   Height: 6\' 2"  (1.88 m)    General: obese female, well-developed, in no acute distress  LLE: limited exam as patient complained of severe pain with both passive and active range of motion  Ext: warm and well perfused, trace edema bilaterally    Assessment & Plan:   See Encounters Tab for problem based charting.  Patient seen with Dr. Dareen Piano

## 2017-05-19 NOTE — Telephone Encounter (Signed)
Patient Called with the results of her recent Hip Xray. This Xray was ordered on 12/07 to rule out OA as a cause for her hip pain at that time. She only recent had the Xray performed. She was informed that the Xray showed only minimal changes (possibly OA) and did not show any explanation for significant hip pain.  Pearson Grippe, DO IM PGY-1 Pager: [phone]

## 2017-05-19 NOTE — Assessment & Plan Note (Addendum)
Patient presents for left lower extremity pain follow-up.  She has a history of bilateral, chronic venous insufficiency 2/2 PVD that is thought to be the cause of her lower extremity pain. She was seen by vascular surgery recently who recommended elevation of the legs as left venous stents are stable.  She states that she is now having new hip pain that she describes as a 10/10. L hip XR with mild OA (this was not discussed with patient as she had XR just prior to clinic visit and image had not been read when seen). Throughout the encounter, she refuses to provide further history and requests Percocet or oxycodone for her pain. She was prescribed Cymbalta by her PCP ibn 1/24, but states is not working. At that time she was also referred to the pain clinic and has an appointment on 2/23 but is requesting pain medication until the appointment.  I discussed with patient we will not prescribe opiates for her pain as her PCP did not feel comfortable doing so and she already has an appointment at the pain clinic in 6 days.  She became very upset and stated she will continue to go to the ED for pain medication.  She also stated that she understands naproxen increases her risk for bleeding but she will do what she has to do to control her pain.   - Follow up in pain clinic 2/23 - Advised patient to limit intake of NSAIDs given she increased risk for bleeding as she is currently on Xarelto

## 2017-05-19 NOTE — Assessment & Plan Note (Signed)
Patient found to have BP 193/111 on arrival to her visit.  She is on benazepril 40 and torsemide 20 PRN. It is unclear if she is compliant with her medications. She became very upset after denying her opiate medication for hip pain and did not want to address her blood pressure.  Stated we made her upset and her blood pressure increased.  Advised patient to continue taking her medications at home and to follow-up with [place] in 2-4 weeks for BP recheck.   - Continue current regimen  - Follow up in 2-4 weeks for BP recheck

## 2017-05-21 NOTE — Progress Notes (Signed)
Internal Medicine Clinic Attending  I saw and evaluated the patient.  I personally confirmed the key portions of the history and exam documented by Dr. Santos-Sanchez and I reviewed pertinent patient test results.  The assessment, diagnosis, and plan were formulated together and I agree with the documentation in the resident's note. 

## 2017-05-24 ENCOUNTER — Emergency Department (HOSPITAL_COMMUNITY): Payer: Medicare Other

## 2017-05-24 ENCOUNTER — Encounter (HOSPITAL_COMMUNITY): Payer: Self-pay | Admitting: Emergency Medicine

## 2017-05-24 ENCOUNTER — Emergency Department (HOSPITAL_COMMUNITY)
Admission: EM | Admit: 2017-05-24 | Discharge: 2017-05-24 | Disposition: A | Discharge status: Home / Self Care | Payer: Medicare Other | Attending: Emergency Medicine | Admitting: Emergency Medicine

## 2017-05-24 DIAGNOSIS — I10 Essential (primary) hypertension: Secondary | ICD-10-CM | POA: Diagnosis not present

## 2017-05-24 DIAGNOSIS — Z7901 Long term (current) use of anticoagulants: Secondary | ICD-10-CM | POA: Insufficient documentation

## 2017-05-24 DIAGNOSIS — Z86718 Personal history of other venous thrombosis and embolism: Secondary | ICD-10-CM | POA: Insufficient documentation

## 2017-05-24 DIAGNOSIS — I4891 Unspecified atrial fibrillation: Secondary | ICD-10-CM | POA: Insufficient documentation

## 2017-05-24 DIAGNOSIS — I509 Heart failure, unspecified: Secondary | ICD-10-CM | POA: Insufficient documentation

## 2017-05-24 DIAGNOSIS — R52 Pain, unspecified: Secondary | ICD-10-CM | POA: Diagnosis not present

## 2017-05-24 DIAGNOSIS — R05 Cough: Secondary | ICD-10-CM | POA: Diagnosis not present

## 2017-05-24 DIAGNOSIS — R0602 Shortness of breath: Secondary | ICD-10-CM | POA: Diagnosis not present

## 2017-05-24 DIAGNOSIS — M79606 Pain in leg, unspecified: Secondary | ICD-10-CM | POA: Diagnosis not present

## 2017-05-24 DIAGNOSIS — L03116 Cellulitis of left lower limb: Secondary | ICD-10-CM | POA: Diagnosis not present

## 2017-05-24 DIAGNOSIS — Z86711 Personal history of pulmonary embolism: Secondary | ICD-10-CM | POA: Diagnosis not present

## 2017-05-24 DIAGNOSIS — F17210 Nicotine dependence, cigarettes, uncomplicated: Secondary | ICD-10-CM | POA: Insufficient documentation

## 2017-05-24 DIAGNOSIS — J449 Chronic obstructive pulmonary disease, unspecified: Secondary | ICD-10-CM | POA: Diagnosis not present

## 2017-05-24 DIAGNOSIS — M25562 Pain in left knee: Secondary | ICD-10-CM | POA: Diagnosis not present

## 2017-05-24 DIAGNOSIS — Z79899 Other long term (current) drug therapy: Secondary | ICD-10-CM | POA: Diagnosis not present

## 2017-05-24 DIAGNOSIS — M79662 Pain in left lower leg: Secondary | ICD-10-CM | POA: Diagnosis present

## 2017-05-24 DIAGNOSIS — R509 Fever, unspecified: Secondary | ICD-10-CM | POA: Diagnosis not present

## 2017-05-24 LAB — COMPREHENSIVE METABOLIC PANEL
ALT: 13 U/L — ABNORMAL LOW (ref 14–54)
AST: 18 U/L (ref 15–41)
Albumin: 3.9 g/dL (ref 3.5–5.0)
Alkaline Phosphatase: 87 U/L (ref 38–126)
Anion gap: 11 (ref 5–15)
BUN: 16 mg/dL (ref 6–20)
CO2: 24 mmol/L (ref 22–32)
Calcium: 9 mg/dL (ref 8.9–10.3)
Chloride: 104 mmol/L (ref 101–111)
Creatinine, Ser: 0.81 mg/dL (ref 0.44–1.00)
GFR calc Af Amer: >60 mL/min (ref >60)
GFR calc non Af Amer: >60 mL/min (ref >60)
Glucose, Bld: 102 mg/dL — ABNORMAL HIGH (ref 65–99)
Potassium: 3.9 mmol/L (ref 3.5–5.1)
Sodium: 139 mmol/L (ref 135–145)
Total Bilirubin: 0.8 mg/dL (ref 0.3–1.2)
Total Protein: 7.7 g/dL (ref 6.5–8.1)

## 2017-05-24 LAB — URINALYSIS, ROUTINE W REFLEX MICROSCOPIC
Bilirubin Urine: NEGATIVE
Glucose, UA: NEGATIVE mg/dL
Hgb urine dipstick: NEGATIVE
Ketones, ur: NEGATIVE mg/dL
Leukocytes, UA: NEGATIVE
Nitrite: NEGATIVE
Protein, ur: NEGATIVE mg/dL
Specific Gravity, Urine: 1.011 (ref 1.005–1.030)
pH: 8 (ref 5.0–8.0)

## 2017-05-24 LAB — CBC WITH DIFFERENTIAL/PLATELET
Basophils Absolute: 0 10*3/uL (ref 0.0–0.1)
Basophils Relative: 0 %
Eosinophils Absolute: 0 10*3/uL (ref 0.0–0.7)
Eosinophils Relative: 0 %
HCT: 41.1 % (ref 36.0–46.0)
Hemoglobin: 13.8 g/dL (ref 12.0–15.0)
Lymphocytes Relative: 7 %
Lymphs Abs: 0.7 10*3/uL (ref 0.7–4.0)
MCH: 29.1 pg (ref 26.0–34.0)
MCHC: 33.6 g/dL (ref 30.0–36.0)
MCV: 86.7 fL (ref 78.0–100.0)
Monocytes Absolute: 0.3 10*3/uL (ref 0.1–1.0)
Monocytes Relative: 3 %
Neutro Abs: 9 10*3/uL — ABNORMAL HIGH (ref 1.7–7.7)
Neutrophils Relative %: 90 %
Platelets: 394 10*3/uL (ref 150–400)
RBC: 4.74 MIL/uL (ref 3.87–5.11)
RDW: 14.4 % (ref 11.5–15.5)
WBC: 10.1 10*3/uL (ref 4.0–10.5)

## 2017-05-24 LAB — I-STAT CG4 LACTIC ACID, ED: Lactic Acid, Venous: 1.21 mmol/L (ref 0.5–1.9)

## 2017-05-24 MED ORDER — CLINDAMYCIN HCL 150 MG PO CAPS: 300.0000 mg | ORAL_CAPSULE | Freq: Three times a day (TID) | ORAL | 0 refills | Status: DC

## 2017-05-24 MED ORDER — MORPHINE SULFATE (PF) 4 MG/ML IV SOLN
4.0000 mg | Freq: Once | INTRAVENOUS | Status: AC
  Administered 2017-05-24: 4 mg via INTRAVENOUS

## 2017-05-24 MED ORDER — CLINDAMYCIN HCL 300 MG PO CAPS
300.0000 mg | ORAL_CAPSULE | Freq: Once | ORAL | Status: AC
  Administered 2017-05-24: 300 mg via ORAL
  Filled 2017-05-24: qty 1

## 2017-05-24 MED ORDER — ACETAMINOPHEN 500 MG PO TABS
1000.0000 mg | ORAL_TABLET | Freq: Once | ORAL | Status: AC
  Administered 2017-05-24: 1000 mg via ORAL
  Filled 2017-05-24: qty 2

## 2017-05-24 MED ORDER — MORPHINE SULFATE (PF) 4 MG/ML IV SOLN
4.0000 mg | Freq: Once | INTRAVENOUS | Status: DC
  Filled 2017-05-24: qty 1

## 2017-05-24 NOTE — ED Notes (Signed)
Bed: EX52 Expected date:  Expected time:  Means of arrival:  Comments: EMS-leg pain

## 2017-05-24 NOTE — ED Provider Notes (Signed)
[facility] DEPT Provider Note   CSN: [id number] Arrival date & time: 05/24/17  8676     History   Chief Complaint Chief Complaint  Patient presents with  . Leg Pain    HPI Sharon Vasquez is a 67 y.o. female.  HPI Patient presents with ongoing leg pain. Patient has a long history of pain in both legs, but this episode has been about 2 weeks of worsening pain in the left leg. She also notes multiple other ongoing concerns, and answers in the affirmative to virtually all review of system questions. However, it seems as though the patient has no new fever, no new dyspnea, no new chest pain, no new vomiting, diarrhea. She has had minimal control of her lower extremity pain with gabapentin, Naprosyn, other nonnarcotic agents. She went to her primary care office, recently, had evaluation of her hypertension, leg pain, was encouraged to take her medication regularly, which she has seemingly not been doing.  Past Medical History:  Diagnosis Date  . A-fib ([place])   . Anxiety   . Arthritis    "qwhre; joints, back" (04/17/2017)  . Benign breast cyst in female, left 01/08/2017   Found by Screening mammogram, evaluated by U/S on 01/08/17 and determined to be a benign simple breast cyst.  . CHF (congestive heart failure) ([place])   . Chronic lower back pain   . COPD (chronic obstructive pulmonary disease) ([place])   . Depression   . DVT (deep venous thrombosis) ([place]) 11/16/2016  . GERD (gastroesophageal reflux disease)   . Headache    "weekly for the last 3 months" (04/17/2017)  . Hyperlipidemia   . Hypertension   . Morbid obesity ([place])   . PE (pulmonary embolism)   . Pulmonary embolism ([place]) 09/21/2014    Patient Active Problem List   Diagnosis Date Noted  . Left hip pain 03/09/2017  . Major depression, recurrent, chronic ([place]) 02/01/2017  . Benign breast cyst in female, left 01/08/2017  . Osteoporosis 12/14/2016  . Chronic low back pain 08/21/2016  . Drug  induced constipation 08/21/2016  . Elevated blood sugar 07/19/2016  . Abdominal pain 07/19/2016  . Aortic atherosclerosis ([place]) 07/17/2016  . Chronic venous insufficiency 07/12/2016  . Chronic anticoagulation 07/12/2016  . Chest pain 07/11/2016  . Tobacco use 07/11/2016  . COPD (chronic obstructive pulmonary disease) ([place])   . (HFpEF) heart failure with preserved ejection fraction ([place])   . History of pulmonary embolism   . PAF (paroxysmal atrial fibrillation) ([place])   . Vocal cord polyp 12/18/2015  . Laryngopharyngeal reflux 12/18/2015  . Dysphonia 12/16/2015  . Morbid obesity due to excess calories ([place])   . Peripheral vascular disease ([place])   . Benign essential HTN   . Atrial fibrillation with RVR ([place]) 09/21/2014  . Primary osteoarthritis of both knees 09/16/2014    Past Surgical History:  Procedure Laterality Date  . ABDOMINAL HYSTERECTOMY    . APPENDECTOMY    . BREAST CYST EXCISION Left    "six o'clock"  . BREAST LUMPECTOMY Left   . CHOLECYSTECTOMY    . DILATION AND CURETTAGE OF UTERUS    . TONSILLECTOMY AND ADENOIDECTOMY    . TUBAL LIGATION      OB History    No data available       Home Medications    Prior to Admission medications   Medication Sig Start Date End Date Taking? Authorizing Provider  albuterol (PROAIR HFA) 108 (90 Base) MCG/ACT inhaler Inhale 2 puffs into  the lungs every 6 (six) hours as needed for wheezing or shortness of breath. 04/25/17  Yes Joni Reining C, DO  albuterol (PROVENTIL) (2.5 MG/3ML) 0.083% nebulizer solution Take 3 mLs (2.5 mg total) by nebulization every 4 (four) hours as needed for wheezing or shortness of breath. 05/01/17 05/31/17 Yes Lucious Groves, DO  amiodarone (PACERONE) 200 MG tablet TAKE 1 TABLET BY MOUTH EVERY DAY Patient taking differently: 200MG  BY MOUTH ONCE DAILY 01/29/17  Yes Almyra Deforest, PA  aspirin EC 81 MG tablet Take 81 mg by mouth daily.   Yes [provider]  atorvastatin (LIPITOR) 40 MG tablet Take 1  tablet (40 mg total) by mouth daily. 12/30/16  Yes Lucious Groves, DO  benazepril (LOTENSIN) 40 MG tablet Take 1 tablet (40 mg total) by mouth daily. 04/26/17 04/26/18 Yes Lucious Groves, DO  DM-Doxylamine-Acetaminophen (NYQUIL COLD & FLU PO) Take 30 mLs by mouth at bedtime as needed (cough).   Yes [provider]  DULoxetine (CYMBALTA) 30 MG capsule Take 1 capsule (30 mg total) by mouth daily for 7 days, THEN 2 capsules (60 mg total) daily. 04/26/17 05/03/18 Yes Lucious Groves, DO  esomeprazole (NEXIUM) 40 MG capsule Take 1 capsule (40 mg total) daily as needed by mouth (for heartburn or indigestion). 02/06/17  Yes Lucious Groves, DO  Fluticasone-Salmeterol (ADVAIR DISKUS) 250-50 MCG/DOSE AEPB Inhale 1 puff into the lungs 2 (two) times daily. Rinse mouth after each use 01/24/17 01/24/18 Yes Lucious Groves, DO  gabapentin (NEURONTIN) 400 MG capsule Take 1 capsule (400 mg total) by mouth daily. Patient taking differently: Take 400 mg by mouth 2 (two) times daily.  08/14/16  Yes Lucious Groves, DO  potassium chloride (K-DUR,KLOR-CON) 10 MEQ tablet Take 1 tablet (10 mEq total) by mouth 2 (two) times daily. 09/14/16  Yes Lorella Nimrod, MD  rivaroxaban (XARELTO) 20 MG TABS tablet Take 1 tablet (20 mg total) by mouth every morning. 12/21/16  Yes Lucious Groves, DO  tiotropium (SPIRIVA HANDIHALER) 18 MCG inhalation capsule Place 1 capsule (18 mcg total) into inhaler and inhale daily. 09/07/16  Yes Alphonzo Grieve, MD  torsemide (DEMADEX) 20 MG tablet Take 1 tablet (20 mg total) by mouth 2 (two) times daily. Patient taking differently: Take 20 mg by mouth daily as needed (fluid retention).  11/09/16  Yes Lucious Groves, DO  nicotine (NICODERM CQ - DOSED IN MG/24 HOURS) 21 mg/24hr patch Place 1 patch (21 mg total) onto the skin daily. Patient not taking: Reported on 03/21/2017 09/08/16   Alphonzo Grieve, MD  senna-docusate (SENOKOT-S) 8.6-50 MG tablet Take 1 tablet by mouth 2 (two) times daily. Patient not  taking: Reported on 03/21/2017 11/09/16   Lucious Groves, DO    Family History Family History  Problem Relation Age of Onset  . Breast cancer Mother   . Hypertension Mother   . Hyperlipidemia Mother   . Hypertension Maternal Grandfather   . Hyperlipidemia Maternal Grandfather     Social History Social History   Tobacco Use  . Smoking status: Current Some Day Smoker    Packs/day: 0.10    Years: 55.00    Pack years: 5.50    Types: Cigarettes    Start date: 08/16/1961  . Smokeless tobacco: Never Used  Substance Use Topics  . Alcohol use: No  . Drug use: No     Allergies   Patient has no known allergies.   Review of Systems Review of Systems  Constitutional:  Per HPI, otherwise negative  HENT:       Per HPI, otherwise negative  Respiratory:       Per HPI, otherwise negative  Cardiovascular:       Per HPI, otherwise negative  Gastrointestinal: Negative for vomiting.  Endocrine:       Negative aside from HPI  Genitourinary:       Neg aside from HPI   Musculoskeletal:       Per HPI, otherwise negative  Skin: Positive for color change and wound.  Neurological: Negative for syncope.  Psychiatric/Behavioral: Positive for dysphoric mood.     Physical Exam Updated Vital Signs BP (!) 155/91   Pulse 85   Temp (!) 102.1 F (38.9 C) (Oral)   Resp (!) 28   SpO2 94%   Physical Exam  Constitutional: She is oriented to person, place, and time. She appears well-developed and well-nourished. No distress.  HENT:  Head: Normocephalic and atraumatic.  Eyes: Conjunctivae and EOM are normal.  Cardiovascular: Normal rate and regular rhythm.  Pulmonary/Chest: No stridor. She has decreased breath sounds.  Abdominal: She exhibits no distension. There is no tenderness.  Musculoskeletal: She exhibits no edema.  Extremely large habitus.  No gross deformities of the either hip, either knee, though there is chronic venous congestion, apparent in both lower extremities.    Neurological: She is alert and oriented to person, place, and time. No cranial nerve deficit.  Skin: Skin is warm and dry. There is erythema.     Psychiatric: Her mood appears anxious.  Nursing note and vitals reviewed.    ED Treatments / Results  Labs (all labs ordered are listed, but only abnormal results are displayed) Labs Reviewed  COMPREHENSIVE METABOLIC PANEL - Abnormal; Notable for the following components:      Result Value   Glucose, Bld 102 (*)    ALT 13 (*)    All other components within normal limits  CBC WITH DIFFERENTIAL/PLATELET - Abnormal; Notable for the following components:   Neutro Abs 9.0 (*)    All other components within normal limits  URINALYSIS, ROUTINE W REFLEX MICROSCOPIC  I-STAT CG4 LACTIC ACID, ED  I-STAT CG4 LACTIC ACID, ED    Radiology Dg Chest 2 View  Result Date: 05/24/2017 CLINICAL DATA:  Cough, congestion, fever, shortness of breath EXAM: CHEST  2 VIEW COMPARISON:  Chest x-ray of 04/18/2017 FINDINGS: There is mild cardiomegaly remaining, and there does appear to be mild pulmonary vascular congestion. A tiny amount of fluid is present in the fissures on the lateral view. No pleural effusion is seen. Mediastinal and hilar contours are unremarkable. The heart is within normal limits in size. IMPRESSION: Cardiomegaly and mild pulmonary vascular congestion. Electronically Signed   By: Ivar Drape M.D.   On: 05/24/2017 11:33    Procedures Procedures (including critical care time)  Medications Ordered in ED Medications  acetaminophen (TYLENOL) tablet 1,000 mg (1,000 mg Oral Given 05/24/17 1216)  clindamycin (CLEOCIN) capsule 300 mg (300 mg Oral Given 05/24/17 1216)  morphine 4 MG/ML injection 4 mg (4 mg Intravenous Given 05/24/17 1216)     Initial Impression / Assessment and Plan / ED Course  I have reviewed the triage vital signs and the nursing notes.  Pertinent labs & imaging results that were available during my care of the patient were  reviewed by me and considered in my medical decision making (see chart for details).   Chart review notable for ongoing interaction with her primary care office. When asked  about return visit, patient states that they recently did x-rays of her left hip, but she has ongoing left knee pain as well, and requests/demands x-ray of her left knee. Patient has received morphine, Tylenol, clindamycin for cellulitis.   Final Clinical Impressions(s) / ED Diagnoses  Cellulitis Left knee pain Fever   Carmin Muskrat, MD 05/24/17 1728

## 2017-05-24 NOTE — ED Triage Notes (Signed)
Per EMS, states left leg pain for 2 weeks-worse in last 2 hours-red and warm to touch-no injury or trauma-history of PE's-no respiratory distress

## 2017-05-24 NOTE — ED Notes (Signed)
Patient transported to X-ray 

## 2017-05-26 DIAGNOSIS — M545 Low back pain: Secondary | ICD-10-CM | POA: Diagnosis not present

## 2017-05-26 DIAGNOSIS — M25562 Pain in left knee: Secondary | ICD-10-CM | POA: Diagnosis not present

## 2017-05-26 DIAGNOSIS — G894 Chronic pain syndrome: Secondary | ICD-10-CM | POA: Diagnosis not present

## 2017-05-26 DIAGNOSIS — M25571 Pain in right ankle and joints of right foot: Secondary | ICD-10-CM | POA: Diagnosis not present

## 2017-05-26 DIAGNOSIS — M25561 Pain in right knee: Secondary | ICD-10-CM | POA: Diagnosis not present

## 2017-05-29 ENCOUNTER — Inpatient Hospital Stay (HOSPITAL_COMMUNITY)
Admission: EM | Admit: 2017-05-29 | Discharge: 2017-05-31 | DRG: 602 | Disposition: A | Discharge status: Home Health | Payer: Medicare Other | Attending: Internal Medicine | Admitting: Internal Medicine

## 2017-05-29 ENCOUNTER — Observation Stay (HOSPITAL_COMMUNITY): Payer: Medicare Other

## 2017-05-29 ENCOUNTER — Encounter (HOSPITAL_COMMUNITY): Payer: Self-pay

## 2017-05-29 ENCOUNTER — Emergency Department (HOSPITAL_BASED_OUTPATIENT_CLINIC_OR_DEPARTMENT_OTHER)
Admit: 2017-05-29 | Discharge: 2017-05-29 | Disposition: A | Discharge status: Home / Self Care | Payer: Medicare Other | Attending: Emergency Medicine | Admitting: Emergency Medicine

## 2017-05-29 ENCOUNTER — Other Ambulatory Visit: Payer: Self-pay

## 2017-05-29 DIAGNOSIS — E039 Hypothyroidism, unspecified: Secondary | ICD-10-CM | POA: Diagnosis not present

## 2017-05-29 DIAGNOSIS — Z803 Family history of malignant neoplasm of breast: Secondary | ICD-10-CM

## 2017-05-29 DIAGNOSIS — E785 Hyperlipidemia, unspecified: Secondary | ICD-10-CM | POA: Diagnosis present

## 2017-05-29 DIAGNOSIS — I5032 Chronic diastolic (congestive) heart failure: Secondary | ICD-10-CM | POA: Diagnosis present

## 2017-05-29 DIAGNOSIS — Z8349 Family history of other endocrine, nutritional and metabolic diseases: Secondary | ICD-10-CM

## 2017-05-29 DIAGNOSIS — R600 Localized edema: Secondary | ICD-10-CM | POA: Diagnosis not present

## 2017-05-29 DIAGNOSIS — I872 Venous insufficiency (chronic) (peripheral): Secondary | ICD-10-CM | POA: Diagnosis present

## 2017-05-29 DIAGNOSIS — R0602 Shortness of breath: Secondary | ICD-10-CM | POA: Diagnosis not present

## 2017-05-29 DIAGNOSIS — Z8249 Family history of ischemic heart disease and other diseases of the circulatory system: Secondary | ICD-10-CM | POA: Diagnosis not present

## 2017-05-29 DIAGNOSIS — I5030 Unspecified diastolic (congestive) heart failure: Secondary | ICD-10-CM

## 2017-05-29 DIAGNOSIS — Z9114 Patient's other noncompliance with medication regimen: Secondary | ICD-10-CM | POA: Diagnosis not present

## 2017-05-29 DIAGNOSIS — Z86718 Personal history of other venous thrombosis and embolism: Secondary | ICD-10-CM | POA: Diagnosis not present

## 2017-05-29 DIAGNOSIS — I480 Paroxysmal atrial fibrillation: Secondary | ICD-10-CM | POA: Diagnosis not present

## 2017-05-29 DIAGNOSIS — R238 Other skin changes: Secondary | ICD-10-CM | POA: Diagnosis present

## 2017-05-29 DIAGNOSIS — K219 Gastro-esophageal reflux disease without esophagitis: Secondary | ICD-10-CM | POA: Diagnosis present

## 2017-05-29 DIAGNOSIS — R748 Abnormal levels of other serum enzymes: Secondary | ICD-10-CM | POA: Diagnosis present

## 2017-05-29 DIAGNOSIS — I110 Hypertensive heart disease with heart failure: Secondary | ICD-10-CM | POA: Diagnosis not present

## 2017-05-29 DIAGNOSIS — G8929 Other chronic pain: Secondary | ICD-10-CM | POA: Diagnosis not present

## 2017-05-29 DIAGNOSIS — Z79891 Long term (current) use of opiate analgesic: Secondary | ICD-10-CM

## 2017-05-29 DIAGNOSIS — Z86711 Personal history of pulmonary embolism: Secondary | ICD-10-CM

## 2017-05-29 DIAGNOSIS — J189 Pneumonia, unspecified organism: Secondary | ICD-10-CM | POA: Diagnosis not present

## 2017-05-29 DIAGNOSIS — Z9049 Acquired absence of other specified parts of digestive tract: Secondary | ICD-10-CM

## 2017-05-29 DIAGNOSIS — M170 Bilateral primary osteoarthritis of knee: Secondary | ICD-10-CM

## 2017-05-29 DIAGNOSIS — M810 Age-related osteoporosis without current pathological fracture: Secondary | ICD-10-CM | POA: Diagnosis present

## 2017-05-29 DIAGNOSIS — Z6839 Body mass index (BMI) 39.0-39.9, adult: Secondary | ICD-10-CM | POA: Diagnosis not present

## 2017-05-29 DIAGNOSIS — I878 Other specified disorders of veins: Secondary | ICD-10-CM | POA: Diagnosis not present

## 2017-05-29 DIAGNOSIS — I700 Atherosclerosis of aorta: Secondary | ICD-10-CM | POA: Diagnosis present

## 2017-05-29 DIAGNOSIS — Z7951 Long term (current) use of inhaled steroids: Secondary | ICD-10-CM

## 2017-05-29 DIAGNOSIS — Z79899 Other long term (current) drug therapy: Secondary | ICD-10-CM

## 2017-05-29 DIAGNOSIS — Z7901 Long term (current) use of anticoagulants: Secondary | ICD-10-CM

## 2017-05-29 DIAGNOSIS — L03116 Cellulitis of left lower limb: Secondary | ICD-10-CM | POA: Diagnosis not present

## 2017-05-29 DIAGNOSIS — F17210 Nicotine dependence, cigarettes, uncomplicated: Secondary | ICD-10-CM | POA: Diagnosis present

## 2017-05-29 DIAGNOSIS — J440 Chronic obstructive pulmonary disease with acute lower respiratory infection: Secondary | ICD-10-CM | POA: Diagnosis not present

## 2017-05-29 DIAGNOSIS — M7989 Other specified soft tissue disorders: Secondary | ICD-10-CM | POA: Diagnosis present

## 2017-05-29 DIAGNOSIS — J449 Chronic obstructive pulmonary disease, unspecified: Secondary | ICD-10-CM | POA: Diagnosis not present

## 2017-05-29 DIAGNOSIS — R7989 Other specified abnormal findings of blood chemistry: Secondary | ICD-10-CM | POA: Diagnosis not present

## 2017-05-29 DIAGNOSIS — M545 Low back pain: Secondary | ICD-10-CM | POA: Diagnosis not present

## 2017-05-29 HISTORY — DX: Venous insufficiency (chronic) (peripheral): I87.2

## 2017-05-29 HISTORY — DX: Cellulitis of left lower limb: L03.116

## 2017-05-29 LAB — CBC
HCT: 35.6 % — ABNORMAL LOW (ref 36.0–46.0)
Hemoglobin: 11.9 g/dL — ABNORMAL LOW (ref 12.0–15.0)
MCH: 28.9 pg (ref 26.0–34.0)
MCHC: 33.4 g/dL (ref 30.0–36.0)
MCV: 86.4 fL (ref 78.0–100.0)
Platelets: 439 10*3/uL — ABNORMAL HIGH (ref 150–400)
RBC: 4.12 MIL/uL (ref 3.87–5.11)
RDW: 15.1 % (ref 11.5–15.5)
WBC: 9.6 10*3/uL (ref 4.0–10.5)

## 2017-05-29 LAB — I-STAT CG4 LACTIC ACID, ED: Lactic Acid, Venous: 1.42 mmol/L (ref 0.5–1.9)

## 2017-05-29 LAB — BASIC METABOLIC PANEL
Anion gap: 11 (ref 5–15)
BUN: 12 mg/dL (ref 6–20)
CO2: 23 mmol/L (ref 22–32)
Calcium: 8.8 mg/dL — ABNORMAL LOW (ref 8.9–10.3)
Chloride: 105 mmol/L (ref 101–111)
Creatinine, Ser: 0.94 mg/dL (ref 0.44–1.00)
GFR calc Af Amer: >60 mL/min (ref >60)
GFR calc non Af Amer: >60 mL/min (ref >60)
Glucose, Bld: 108 mg/dL — ABNORMAL HIGH (ref 65–99)
Potassium: 3.2 mmol/L — ABNORMAL LOW (ref 3.5–5.1)
Sodium: 139 mmol/L (ref 135–145)

## 2017-05-29 LAB — I-STAT TROPONIN, ED: Troponin i, poc: 0 ng/mL (ref 0.00–0.08)

## 2017-05-29 LAB — TROPONIN I: Troponin I: 0.07 ng/mL (ref <0.03)

## 2017-05-29 MED ORDER — ACETAMINOPHEN 325 MG PO TABS
650.0000 mg | ORAL_TABLET | Freq: Once | ORAL | Status: AC
  Administered 2017-05-29: 650 mg via ORAL
  Filled 2017-05-29: qty 2

## 2017-05-29 MED ORDER — VANCOMYCIN HCL IN DEXTROSE 1-5 GM/200ML-% IV SOLN
1000.0000 mg | Freq: Once | INTRAVENOUS | Status: AC
  Administered 2017-05-29: 1000 mg via INTRAVENOUS
  Filled 2017-05-29: qty 200

## 2017-05-29 MED ORDER — OXYCODONE-ACETAMINOPHEN 5-325 MG PO TABS
1.0000 | ORAL_TABLET | ORAL | Status: AC | PRN
  Administered 2017-05-29 (×2): 1 via ORAL
  Filled 2017-05-29 (×2): qty 1

## 2017-05-29 MED ORDER — PANTOPRAZOLE SODIUM 40 MG PO TBEC
40.0000 mg | DELAYED_RELEASE_TABLET | Freq: Every day | ORAL | Status: DC
  Administered 2017-05-29 – 2017-05-31 (×3): 40 mg via ORAL
  Filled 2017-05-29 (×3): qty 1

## 2017-05-29 MED ORDER — GABAPENTIN 400 MG PO CAPS
400.0000 mg | ORAL_CAPSULE | Freq: Two times a day (BID) | ORAL | Status: DC
  Administered 2017-05-29 – 2017-05-31 (×4): 400 mg via ORAL
  Filled 2017-05-29 (×4): qty 1

## 2017-05-29 MED ORDER — IPRATROPIUM-ALBUTEROL 0.5-2.5 (3) MG/3ML IN SOLN
3.0000 mL | Freq: Once | RESPIRATORY_TRACT | Status: AC
  Administered 2017-05-29: 3 mL via RESPIRATORY_TRACT
  Filled 2017-05-29: qty 3

## 2017-05-29 MED ORDER — MORPHINE SULFATE (PF) 4 MG/ML IV SOLN
2.0000 mg | Freq: Once | INTRAVENOUS | Status: AC
  Administered 2017-05-29: 2 mg via INTRAVENOUS
  Filled 2017-05-29: qty 1

## 2017-05-29 MED ORDER — TIOTROPIUM BROMIDE MONOHYDRATE 18 MCG IN CAPS: 18.0000 ug | ORAL_CAPSULE | Freq: Every day | RESPIRATORY_TRACT | Status: DC

## 2017-05-29 MED ORDER — BENAZEPRIL HCL 40 MG PO TABS
40.0000 mg | ORAL_TABLET | Freq: Every day | ORAL | Status: DC
  Administered 2017-05-30 – 2017-05-31 (×2): 40 mg via ORAL
  Filled 2017-05-29 (×2): qty 1

## 2017-05-29 MED ORDER — AZITHROMYCIN 250 MG PO TABS
500.0000 mg | ORAL_TABLET | Freq: Once | ORAL | Status: AC
  Administered 2017-05-29: 500 mg via ORAL
  Filled 2017-05-29: qty 2

## 2017-05-29 MED ORDER — ACETAMINOPHEN 650 MG RE SUPP: 650.0000 mg | Freq: Four times a day (QID) | RECTAL | Status: DC | PRN

## 2017-05-29 MED ORDER — ACETAMINOPHEN 325 MG PO TABS
650.0000 mg | ORAL_TABLET | Freq: Four times a day (QID) | ORAL | Status: DC | PRN
  Administered 2017-05-30 – 2017-05-31 (×4): 650 mg via ORAL
  Filled 2017-05-29 (×4): qty 2

## 2017-05-29 MED ORDER — ATORVASTATIN CALCIUM 40 MG PO TABS
40.0000 mg | ORAL_TABLET | Freq: Every day | ORAL | Status: DC
  Administered 2017-05-30 – 2017-05-31 (×2): 40 mg via ORAL
  Filled 2017-05-29 (×2): qty 1

## 2017-05-29 MED ORDER — AMIODARONE HCL 200 MG PO TABS
200.0000 mg | ORAL_TABLET | Freq: Every day | ORAL | Status: DC
  Administered 2017-05-29 – 2017-05-31 (×3): 200 mg via ORAL
  Filled 2017-05-29 (×3): qty 1

## 2017-05-29 MED ORDER — ONDANSETRON HCL 4 MG/2ML IJ SOLN
4.0000 mg | Freq: Once | INTRAMUSCULAR | Status: AC
  Administered 2017-05-29: 4 mg via INTRAVENOUS
  Filled 2017-05-29: qty 2

## 2017-05-29 MED ORDER — SODIUM CHLORIDE 0.9 % IV BOLUS (SEPSIS)
1000.0000 mL | Freq: Once | INTRAVENOUS | Status: AC
  Administered 2017-05-29: 1000 mL via INTRAVENOUS

## 2017-05-29 MED ORDER — OXYCODONE-ACETAMINOPHEN 5-325 MG PO TABS
1.0000 | ORAL_TABLET | Freq: Two times a day (BID) | ORAL | Status: DC | PRN
  Administered 2017-05-30 – 2017-05-31 (×3): 1 via ORAL
  Filled 2017-05-29 (×3): qty 1

## 2017-05-29 MED ORDER — POLYETHYLENE GLYCOL 3350 17 G PO PACK: 17.0000 g | PACK | Freq: Every day | ORAL | Status: DC | PRN

## 2017-05-29 MED ORDER — RIVAROXABAN 20 MG PO TABS
20.0000 mg | ORAL_TABLET | Freq: Every day | ORAL | Status: DC
  Administered 2017-05-30 – 2017-05-31 (×2): 20 mg via ORAL
  Filled 2017-05-29 (×3): qty 1

## 2017-05-29 MED ORDER — ASPIRIN EC 81 MG PO TBEC
81.0000 mg | DELAYED_RELEASE_TABLET | Freq: Every day | ORAL | Status: DC
  Administered 2017-05-29 – 2017-05-31 (×3): 81 mg via ORAL
  Filled 2017-05-29 (×3): qty 1

## 2017-05-29 MED ORDER — SODIUM CHLORIDE 0.9 % IV SOLN
1250.0000 mg | Freq: Two times a day (BID) | INTRAVENOUS | Status: DC
  Administered 2017-05-30 – 2017-05-31 (×3): 1250 mg via INTRAVENOUS
  Filled 2017-05-29 (×5): qty 1250

## 2017-05-29 MED ORDER — MOMETASONE FURO-FORMOTEROL FUM 200-5 MCG/ACT IN AERO
2.0000 | INHALATION_SPRAY | Freq: Two times a day (BID) | RESPIRATORY_TRACT | Status: DC
  Administered 2017-05-30: 2 via RESPIRATORY_TRACT
  Filled 2017-05-29 (×2): qty 8.8

## 2017-05-29 MED ORDER — ALBUTEROL SULFATE (2.5 MG/3ML) 0.083% IN NEBU: 2.5000 mg | INHALATION_SOLUTION | RESPIRATORY_TRACT | Status: DC | PRN

## 2017-05-29 MED ORDER — TORSEMIDE 20 MG PO TABS
20.0000 mg | ORAL_TABLET | Freq: Two times a day (BID) | ORAL | Status: DC
  Administered 2017-05-29 – 2017-05-31 (×4): 20 mg via ORAL
  Filled 2017-05-29 (×5): qty 1

## 2017-05-29 NOTE — ED Notes (Signed)
Ordered heart healthy 

## 2017-05-29 NOTE — ED Notes (Signed)
Admitting MD in to assess pt for admission 

## 2017-05-29 NOTE — ED Provider Notes (Signed)
[facility] EMERGENCY DEPARTMENT Provider Note   CSN: [id number] Arrival date & time: 05/29/17  1249     History   Chief Complaint Chief Complaint  Patient presents with  . Shortness of Breath  . Leg Wound    HPI Sharon Vasquez is a 67 y.o. female.  67 yO F with a chief complaint of chest pain shortness of breath and left lower extremity pain and swelling.  This been going on for the past week or so.  The patient was initially admitted for community acquired pneumonia and fluid was found not to be the flu and then discharged home.  After her discharge she started having some pain and swelling to her left lower extremity.  She was seen again in the emergency department and started on clindamycin.  This was about 5 days ago.  She feels that since then the erythema has gotten much worse.  She is also had some blistering and some worsening swelling.  She thinks this feels different than her prior DVT.  She is also having some chest pain that seems to come and go.  Not currently having any chest pain.  Not exertional.  Appears to be mostly worse with coughing.  Denies continued fevers or chills.  Has felt that her leg was warm.   The history is provided by the patient.  Shortness of Breath  This is a new problem. The average episode lasts 2 days. The problem occurs continuously.The current episode started more than 1 week ago. The problem has not changed since onset.Associated symptoms include cough, wheezing, chest pain and leg swelling. Pertinent negatives include no fever, no headaches, no rhinorrhea and no vomiting. She has tried nothing for the symptoms. The treatment provided no relief. She has had prior hospitalizations. She has had prior ED visits. Associated medical issues include COPD and PE.    Past Medical History:  Diagnosis Date  . A-fib ([place])   . Anxiety   . Arthritis    "qwhre; joints, back" (04/17/2017)  . Benign breast cyst in female, left 01/08/2017   Found by Screening mammogram, evaluated by U/S on 01/08/17 and determined to be a benign simple breast cyst.  . CHF (congestive heart failure) ([place])   . Chronic lower back pain   . COPD (chronic obstructive pulmonary disease) ([place])   . Depression   . DVT (deep venous thrombosis) ([place]) 11/16/2016  . GERD (gastroesophageal reflux disease)   . Headache    "weekly for the last 3 months" (04/17/2017)  . Hyperlipidemia   . Hypertension   . Morbid obesity ([place])   . PE (pulmonary embolism)   . Pulmonary embolism ([place]) 09/21/2014    Patient Active Problem List   Diagnosis Date Noted  . Left hip pain 03/09/2017  . Major depression, recurrent, chronic ([place]) 02/01/2017  . Benign breast cyst in female, left 01/08/2017  . Osteoporosis 12/14/2016  . Chronic low back pain 08/21/2016  . Drug induced constipation 08/21/2016  . Elevated blood sugar 07/19/2016  . Abdominal pain 07/19/2016  . Aortic atherosclerosis ([place]) 07/17/2016  . Chronic venous insufficiency 07/12/2016  . Chronic anticoagulation 07/12/2016  . Chest pain 07/11/2016  . Tobacco use 07/11/2016  . COPD (chronic obstructive pulmonary disease) ([place])   . (HFpEF) heart failure with preserved ejection fraction ([place])   . History of pulmonary embolism   . PAF (paroxysmal atrial fibrillation) ([place])   . Vocal cord polyp 12/18/2015  . Laryngopharyngeal reflux 12/18/2015  . Dysphonia 12/16/2015  .  Morbid obesity due to excess calories ([place])   . Peripheral vascular disease ([place])   . Benign essential HTN   . Atrial fibrillation with RVR ([place]) 09/21/2014  . Primary osteoarthritis of both knees 09/16/2014    Past Surgical History:  Procedure Laterality Date  . ABDOMINAL HYSTERECTOMY    . APPENDECTOMY    . BREAST CYST EXCISION Left    "six o'clock"  . BREAST LUMPECTOMY Left   . CHOLECYSTECTOMY    . DILATION AND CURETTAGE OF UTERUS    . TONSILLECTOMY AND ADENOIDECTOMY    . TUBAL LIGATION      OB History    No data available        Home Medications    Prior to Admission medications   Medication Sig Start Date End Date Taking? Authorizing Provider  albuterol (PROAIR HFA) 108 (90 Base) MCG/ACT inhaler Inhale 2 puffs into the lungs every 6 (six) hours as needed for wheezing or shortness of breath. 04/25/17  Yes Joni Reining C, DO  albuterol (PROVENTIL) (2.5 MG/3ML) 0.083% nebulizer solution Take 3 mLs (2.5 mg total) by nebulization every 4 (four) hours as needed for wheezing or shortness of breath. 05/01/17 05/31/17 Yes Lucious Groves, DO  amiodarone (PACERONE) 200 MG tablet TAKE 1 TABLET BY MOUTH EVERY DAY Patient taking differently: 200MG  BY MOUTH ONCE DAILY 01/29/17  Yes Almyra Deforest, PA  aspirin EC 81 MG tablet Take 81 mg by mouth daily.   Yes [provider]  atorvastatin (LIPITOR) 40 MG tablet Take 1 tablet (40 mg total) by mouth daily. 12/30/16  Yes Lucious Groves, DO  benazepril (LOTENSIN) 40 MG tablet Take 1 tablet (40 mg total) by mouth daily. 04/26/17 04/26/18 Yes Lucious Groves, DO  clindamycin (CLEOCIN) 150 MG capsule Take 2 capsules (300 mg total) by mouth 3 (three) times daily for 7 days. 05/24/17 05/31/17 Yes Carmin Muskrat, MD  esomeprazole (NEXIUM) 40 MG capsule Take 1 capsule (40 mg total) daily as needed by mouth (for heartburn or indigestion). 02/06/17  Yes Lucious Groves, DO  Fluticasone-Salmeterol (ADVAIR DISKUS) 250-50 MCG/DOSE AEPB Inhale 1 puff into the lungs 2 (two) times daily. Rinse mouth after each use 01/24/17 01/24/18 Yes Lucious Groves, DO  gabapentin (NEURONTIN) 400 MG capsule Take 1 capsule (400 mg total) by mouth daily. Patient taking differently: Take 400 mg by mouth 2 (two) times daily.  08/14/16  Yes Lucious Groves, DO  oxyCODONE-acetaminophen (PERCOCET/ROXICET) 5-325 MG tablet Take 1 tablet by mouth 2 (two) times daily as needed for severe pain. 05/26/17  Yes [provider]  potassium chloride (K-DUR,KLOR-CON) 10 MEQ tablet Take 1 tablet (10 mEq total) by mouth 2 (two)  times daily. 09/14/16  Yes Lorella Nimrod, MD  rivaroxaban (XARELTO) 20 MG TABS tablet Take 1 tablet (20 mg total) by mouth every morning. 12/21/16  Yes Lucious Groves, DO  tiotropium (SPIRIVA HANDIHALER) 18 MCG inhalation capsule Place 1 capsule (18 mcg total) into inhaler and inhale daily. 09/07/16  Yes Alphonzo Grieve, MD  torsemide (DEMADEX) 20 MG tablet Take 1 tablet (20 mg total) by mouth 2 (two) times daily. Patient taking differently: Take 20 mg by mouth daily as needed (fluid retention).  11/09/16  Yes Lucious Groves, DO  DULoxetine (CYMBALTA) 30 MG capsule Take 1 capsule (30 mg total) by mouth daily for 7 days, THEN 2 capsules (60 mg total) daily. Patient not taking: No sig reported 04/26/17 05/03/18  Lucious Groves, DO  nicotine (NICODERM CQ -  DOSED IN MG/24 HOURS) 21 mg/24hr patch Place 1 patch (21 mg total) onto the skin daily. Patient not taking: Reported on 03/21/2017 09/08/16   Alphonzo Grieve, MD    Family History Family History  Problem Relation Age of Onset  . Breast cancer Mother   . Hypertension Mother   . Hyperlipidemia Mother   . Hypertension Maternal Grandfather   . Hyperlipidemia Maternal Grandfather     Social History Social History   Tobacco Use  . Smoking status: Current Some Day Smoker    Packs/day: 0.10    Years: 55.00    Pack years: 5.50    Types: Cigarettes    Start date: 08/16/1961  . Smokeless tobacco: Never Used  Substance Use Topics  . Alcohol use: No  . Drug use: No     Allergies   Patient has no known allergies.   Review of Systems Review of Systems  Constitutional: Negative for chills and fever.  HENT: Negative for congestion and rhinorrhea.   Eyes: Negative for redness and visual disturbance.  Respiratory: Positive for cough, shortness of breath and wheezing.   Cardiovascular: Positive for chest pain and leg swelling. Negative for palpitations.  Gastrointestinal: Negative for nausea and vomiting.  Genitourinary: Negative for dysuria and  urgency.  Musculoskeletal: Negative for arthralgias and myalgias.  Skin: Negative for pallor and wound.  Neurological: Negative for dizziness and headaches.     Physical Exam Updated Vital Signs BP 137/84 (BP Location: Left Arm)   Pulse 72   Temp 98.1 F (36.7 C) (Oral)   Resp 20   SpO2 100%   Physical Exam  Constitutional: She is oriented to person, place, and time. She appears well-developed and well-nourished. No distress.  HENT:  Head: Normocephalic and atraumatic.  Eyes: EOM are normal. Pupils are equal, round, and reactive to light.  Neck: Normal range of motion. Neck supple.  Cardiovascular: Normal rate and regular rhythm. Exam reveals no gallop and no friction rub.  No murmur heard. Pulmonary/Chest: Effort normal. She has wheezes (diffusely). She has no rales.  Abdominal: Soft. She exhibits no distension. There is no tenderness.  Musculoskeletal:       Left lower leg: She exhibits tenderness and edema.  Left lower extremity with erythema edema and blistering.  Warm to touch.  Intact PT pulses.  Edematous compared to the other side.  2+ pitting edema.  Neurological: She is alert and oriented to person, place, and time.  Skin: Skin is warm and dry. She is not diaphoretic.  Psychiatric: She has a normal mood and affect. Her behavior is normal.  Nursing note and vitals reviewed.    ED Treatments / Results  Labs (all labs ordered are listed, but only abnormal results are displayed) Labs Reviewed  BASIC METABOLIC PANEL - Abnormal; Notable for the following components:      Result Value   Potassium 3.2 (*)    Glucose, Bld 108 (*)    Calcium 8.8 (*)    All other components within normal limits  CBC - Abnormal; Notable for the following components:   Hemoglobin 11.9 (*)    HCT 35.6 (*)    Platelets 439 (*)    All other components within normal limits  TROPONIN I - Abnormal; Notable for the following components:   Troponin I 0.07 (*)    All other components within  normal limits  CULTURE, BLOOD (ROUTINE X 2)  CULTURE, BLOOD (ROUTINE X 2)  I-STAT TROPONIN, ED  I-STAT CG4 LACTIC ACID, ED  I-STAT CG4 LACTIC ACID, ED    EKG  EKG Interpretation  Date/Time:  Tuesday May 29 2017 12:56:45 EST Ventricular Rate:  68 PR Interval:  156 QRS Duration: 104 QT Interval:  484 QTC Calculation: 514 R Axis:   -27 Text Interpretation:  Sinus rhythm with Fusion complexes Incomplete right bundle branch block Left ventricular hypertrophy Nonspecific ST and T wave abnormality Abnormal ECG Since last tracing rate slower Otherwise no significant change Confirmed by Deno Etienne [phone]) on 05/29/2017 3:49:17 PM       Radiology No results found.  Procedures Procedures (including critical care time)  Medications Ordered in ED Medications  oxyCODONE-acetaminophen (PERCOCET/ROXICET) 5-325 MG per tablet 1 tablet (1 tablet Oral Given 05/29/17 1312)  morphine 4 MG/ML injection 2 mg (not administered)  ondansetron (ZOFRAN) injection 4 mg (not administered)  acetaminophen (TYLENOL) tablet 650 mg (not administered)  vancomycin (VANCOCIN) IVPB 1000 mg/200 mL premix (not administered)  ipratropium-albuterol (DUONEB) 0.5-2.5 (3) MG/3ML nebulizer solution 3 mL (not administered)  sodium chloride 0.9 % bolus 1,000 mL (not administered)  azithromycin (ZITHROMAX) tablet 500 mg (not administered)     Initial Impression / Assessment and Plan / ED Course  I have reviewed the triage vital signs and the nursing notes.  Pertinent labs & imaging results that were available during my care of the patient were reviewed by me and considered in my medical decision making (see chart for details).     67 yo F with a chief complaint of left lower extremity pain and swelling.  Clinically she appears to have cellulitis.  I doubt that this is necrotizing fasciitis but will obtain a plain film.  Will start on vancomycin as she is failed clindamycin as an outpatient.  DVT study as she had a  DVT in the past.  Labs are reassuring.  Afebrile here.  Discussed with IM residency for admission.  The patients results and plan were reviewed and discussed.   Any x-rays performed were independently reviewed by myself.   Differential diagnosis were considered with the presenting HPI.  Medications  oxyCODONE-acetaminophen (PERCOCET/ROXICET) 5-325 MG per tablet 1 tablet (1 tablet Oral Given 05/29/17 1312)  morphine 4 MG/ML injection 2 mg (not administered)  ondansetron (ZOFRAN) injection 4 mg (not administered)  acetaminophen (TYLENOL) tablet 650 mg (not administered)  vancomycin (VANCOCIN) IVPB 1000 mg/200 mL premix (not administered)  ipratropium-albuterol (DUONEB) 0.5-2.5 (3) MG/3ML nebulizer solution 3 mL (not administered)  sodium chloride 0.9 % bolus 1,000 mL (not administered)  azithromycin (ZITHROMAX) tablet 500 mg (not administered)    Vitals:   05/29/17 1259  BP: 137/84  Pulse: 72  Resp: 20  Temp: 98.1 F (36.7 C)  TempSrc: Oral  SpO2: 100%    Final diagnoses:  Cellulitis of left lower extremity    Admission/ observation were discussed with the admitting physician, patient and/or family and they are comfortable with the plan.    Final Clinical Impressions(s) / ED Diagnoses   Final diagnoses:  Cellulitis of left lower extremity    ED Discharge Orders    None       Deno Etienne, DO 05/29/17 1633

## 2017-05-29 NOTE — Progress Notes (Signed)
LLE venous duplex prelim: no evidence of DVT in visualized veins. Landry Mellow, RDMS, RVT

## 2017-05-29 NOTE — ED Triage Notes (Signed)
PT reports she has been having left leg pain and seen at [facility] for same. She states she wrapped leg because it was "leaking" 3 days ago and when she unwrapped the leg there were multiple large fluid filled blisters and skin sloughing. Leg is red, swollen, and draining. PT endorses sob and chest tightness that has been on and off since she saw the leg.

## 2017-05-29 NOTE — H&P (Addendum)
Date: 05/29/2017               Patient Name:  Sharon Vasquez [name]: [id number]  DOB: 08-07-1950 Age / Sex: 67 y.o., female   PCP: Lucious Groves, DO         Medical Service: Internal Medicine Teaching Service         Attending Physician: Dr. Oval Linsey, MD    First Contact: Dr. Vickki Muff Pager: [phone]  Second Contact: Dr. Lorella Nimrod Pager: [phone]       After Hours (After 5p/  First Contact Pager: [phone]  weekends / holidays): Second Contact Pager: [phone]   Chief Complaint: LLE cellulitis  History of Present Illness: 67 year old female with atrial fibrillation, heart failure with preserved ejection fraction, COPD, DVT, PE patient has had recurrent issues with lower extremity edema.   She continues to take her medication intermittently to help control the edema.  She has had several recent visits to the ED and clinic for her lower extremity edema, the last ED visit she was started on clindamycin.  Over the past week patient reports that the swelling and pain has been getting worse over the last 3 days she wrapped the leg with a calamine wrap.  When she removed the wrap she noticed that the leg looked worse more swollen and red painful, and she noticed ruptured bullae.  The legs have been weeping fluid.  She will begin taking her torsemide again for the last 3 days.  She has not had any fevers or chills, no chest pain or shortness of breath, she has not felt ill recently.  She denies any nausea vomiting or diarrhea.  Her main complaint has been the pain in the leg she has recently seen a pain clinic who started her on oxycodone acetaminophen for this as her PCP did not feel comfortable treating the problem with opioids.     ED course: venous duplex negative for DVT of left leg, no leukocytosis, pt was afebrile, normotensive, not tachycardic.    Meds:  Current Meds  Medication Sig  . albuterol (PROAIR HFA) 108 (90 Base) MCG/ACT inhaler Inhale 2 puffs into the lungs  every 6 (six) hours as needed for wheezing or shortness of breath.  [name] albuterol (PROVENTIL) (2.5 MG/3ML) 0.083% nebulizer solution Take 3 mLs (2.5 mg total) by nebulization every 4 (four) hours as needed for wheezing or shortness of breath.  [name] amiodarone (PACERONE) 200 MG tablet TAKE 1 TABLET BY MOUTH EVERY DAY (Patient taking differently: 200MG  BY MOUTH ONCE DAILY)  . aspirin EC 81 MG tablet Take 81 mg by mouth daily.  [name] atorvastatin (LIPITOR) 40 MG tablet Take 1 tablet (40 mg total) by mouth daily.  . benazepril (LOTENSIN) 40 MG tablet Take 1 tablet (40 mg total) by mouth daily.  . clindamycin (CLEOCIN) 150 MG capsule Take 2 capsules (300 mg total) by mouth 3 (three) times daily for 7 days.  [name] esomeprazole (NEXIUM) 40 MG capsule Take 1 capsule (40 mg total) daily as needed by mouth (for heartburn or indigestion).  . Fluticasone-Salmeterol (ADVAIR DISKUS) 250-50 MCG/DOSE AEPB Inhale 1 puff into the lungs 2 (two) times daily. Rinse mouth after each use  . gabapentin (NEURONTIN) 400 MG capsule Take 1 capsule (400 mg total) by mouth daily. (Patient taking differently: Take 400 mg by mouth 2 (two) times daily. )  . oxyCODONE-acetaminophen (PERCOCET/ROXICET) 5-325 MG tablet Take 1 tablet by mouth 2 (two) times daily as needed for severe pain.  [name]  potassium chloride (K-DUR,KLOR-CON) 10 MEQ tablet Take 1 tablet (10 mEq total) by mouth 2 (two) times daily.  . rivaroxaban (XARELTO) 20 MG TABS tablet Take 1 tablet (20 mg total) by mouth every morning.  . tiotropium (SPIRIVA HANDIHALER) 18 MCG inhalation capsule Place 1 capsule (18 mcg total) into inhaler and inhale daily.  [name] torsemide (DEMADEX) 20 MG tablet Take 1 tablet (20 mg total) by mouth 2 (two) times daily. (Patient taking differently: Take 20 mg by mouth daily as needed (fluid retention). )     Allergies: Allergies as of 05/29/2017  . (No Known Allergies)   Past Medical History:  Diagnosis Date  . A-fib ([place])   . Anxiety   . Arthritis     "qwhre; joints, back" (04/17/2017)  . Benign breast cyst in female, left 01/08/2017   Found by Screening mammogram, evaluated by [facility] on 01/08/17 and determined to be a benign simple breast cyst.  . CHF (congestive heart failure) ([place])   . Chronic lower back pain   . COPD (chronic obstructive pulmonary disease) ([place])   . Depression   . DVT (deep venous thrombosis) ([place]) 11/16/2016  . GERD (gastroesophageal reflux disease)   . Headache    "weekly for the last 3 months" (04/17/2017)  . Hyperlipidemia   . Hypertension   . Morbid obesity ([place])   . PE (pulmonary embolism)   . Pulmonary embolism ([place]) 09/21/2014    Family History:  Family History  Problem Relation Age of Onset  . Breast cancer Mother   . Hypertension Mother   . Hyperlipidemia Mother   . Hypertension Maternal Grandfather   . Hyperlipidemia Maternal Grandfather     Social History:  Social History   Socioeconomic History  . Marital status: Divorced    Spouse name: Not on file  . Number of children: Not on file  . Years of education: Not on file  . Highest education level: Not on file  Social Needs  . Financial resource strain: Not on file  . Food insecurity - worry: Not on file  . Food insecurity - inability: Not on file  . Transportation needs - medical: Not on file  . Transportation needs - non-medical: Not on file  Occupational History  . Not on file  Tobacco Use  . Smoking status: Current Some Day Smoker    Packs/day: 0.10    Years: 55.00    Pack years: 5.50    Types: Cigarettes    Start date: 08/16/1961  . Smokeless tobacco: Never Used  Substance and Sexual Activity  . Alcohol use: No  . Drug use: No  . Sexual activity: Not Currently    Partners: Male  Other Topics Concern  . Not on file  Social History Narrative   Lives in [facility] in [place]. Lives alone, but is dependent in ADLs/IADLs. 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 Two daughters  live nearby. Previously resided in [place].   Continues to smoke self reports about 4 cigarettes per day  No alcohol or illicit drug use  Review of Systems: A complete ROS was negative except as per HPI.   Physical Exam: Blood pressure 137/84, pulse 72, temperature 98.1 F (36.7 C), temperature source Oral, resp. rate 20, SpO2 99 %. Physical Exam  Constitutional: She is oriented to person, place, and time. No distress.  HENT:  Head: Normocephalic and atraumatic.  Neck: Normal range of motion. Neck supple.  Cardiovascular: Normal rate,  regular rhythm and normal heart sounds. Exam reveals no gallop and no friction rub.  No murmur heard. Pulmonary/Chest: Effort normal. No respiratory distress. She has wheezes (diffuse wheezing noted throughout). She has no rales. She exhibits no tenderness.  Abdominal: Soft. Bowel sounds are normal. She exhibits no distension and no mass. There is no tenderness. There is no rebound and no guarding.  Musculoskeletal:       Right lower leg: She exhibits tenderness and edema (3+).       Left lower leg: She exhibits tenderness and edema (3+).  Neurological: She is alert and oriented to person, place, and time.  Skin: She is not diaphoretic.  LLE very warm, erythematous, very painful to touch, leg and foot too swollen to appreciate pulses DP or PT. Several bullae have ruptured, underneath of which fluid is noted to be slowly weeping out. RLE very edematous and painful to touch     Media Information   Document Information   Photos    05/29/2017 17:20  Attached To:  Hospital Encounter on 05/29/17  Source Information   Serene Kopf, Jenne Pane, MD  [facility]   Media Information   Document Information   Photos    05/29/2017 17:20  Attached To:  Hospital Encounter on 05/29/17  Source Information   Atley Scarboro, Jenne Pane, MD  [facility]   Media Information   Document Information   Photos    05/29/2017 17:21  Attached To:  Hospital  Encounter on 05/29/17  Source Information   Nickolaos Brallier, Jenne Pane, MD  [facility]     EKG: personally reviewed my interpretation is sinus rhythm with LVH  CXR: personally reviewed my interpretation is none available  Assessment & Plan by Problem: Active Problems:   Left leg swelling  LLE cellulitis Pt with likely LLE cellulitis, continues to mismanage the care of her leg and taker her medications intermittently.  No systemic signs of infection at this time.  Likely a combination of venous stasis but also heart failure a major factor. Venous doppler negative for DVT.  -continue vancomycin for now -will start with torsemide 20mg  BID   COPD No PFT's on file, pt on Advair, Spriva and albuterol rescue inhaler.  Diffuse wheezing on admission, unsure of compliance with inhalers  -continue Spiriva, Advair -albuterol nebulizer therapy Q4 hours PRN  HFpEF 2018 ECHO was not able to evaluate diastolic function, EF 37-90%, moderately dilated left atrium Weight has fluctuated but trended up over the last year  -torsemide 20mg  BID  PAF In sinus rhythm on admission  -continue xarelto 20mg  daily -continue amiodarone 200mg  daily     Dispo: Admit patient to Observation with expected length of stay less than 2 midnights.  Signed: Katherine Roan, MD 05/29/2017, 6:43 PM  Vickki Muff MD PGY-1 Internal Medicine Pager # [phone]

## 2017-05-29 NOTE — Progress Notes (Signed)
Pharmacy Antibiotic Note  Sharon Vasquez is a 67 y.o. female admitted on 05/29/2017 with cellulitis.  Pharmacy has been consulted for vancomycin dosing.  Presented with red, swollen, and draining leg with large fluid filled blisters and skin sloughing. Duplex is negative for DVT. WBC is WNL. LA is 1.42. Scr is 1.42 (normCrCl 77 mL/min). Received 1 gram of vancomycin at 1718. Will give additional dose to increase full loading dose given body weight of 146 kg.   Plan: Give additional 1 gram dose to equal total loading dose of 2 g  Vancomycin 1250 mg IV every 12 hours.  Goal trough 10-15 mcg/mL. Monitor renal function, cx results, clinical picture, and VT as needed     Temp (24hrs), Avg:98.1 F (36.7 C), Min:98.1 F (36.7 C), Max:98.1 F (36.7 C)  Recent Labs  Lab 05/24/17 1041 05/24/17 1051 05/29/17 1301 05/29/17 1355  WBC 10.1  --  9.6  --   CREATININE 0.81  --  0.94  --   LATICACIDVEN  --  1.21  --  1.42    Estimated Creatinine Clearance: 96.4 mL/min (by C-G formula based on SCr of 0.94 mg/dL).    No Known Allergies  Antimicrobials this admission: Vancomycin 2/26 >>  Azithromycin 2/26 x1  Dose adjustments this admission: N/A  Microbiology results: 2/26 BCx: sent  Thank you for allowing pharmacy to be a part of this patient's care.  Doylene Canard, PharmD Clinical Pharmacist  Pager: [phone] Clinical Phone for 05/29/2017 until 3:30pm: [phone] If after 3:30pm, please call main pharmacy at [phone] 05/29/2017 7:18 PM

## 2017-05-30 ENCOUNTER — Encounter (HOSPITAL_COMMUNITY): Payer: Self-pay | Admitting: General Practice

## 2017-05-30 ENCOUNTER — Telehealth: Payer: Self-pay

## 2017-05-30 DIAGNOSIS — I5033 Acute on chronic diastolic (congestive) heart failure: Secondary | ICD-10-CM | POA: Insufficient documentation

## 2017-05-30 DIAGNOSIS — G8929 Other chronic pain: Secondary | ICD-10-CM | POA: Diagnosis present

## 2017-05-30 DIAGNOSIS — Z7901 Long term (current) use of anticoagulants: Secondary | ICD-10-CM

## 2017-05-30 DIAGNOSIS — R238 Other skin changes: Secondary | ICD-10-CM

## 2017-05-30 DIAGNOSIS — Z79899 Other long term (current) drug therapy: Secondary | ICD-10-CM

## 2017-05-30 DIAGNOSIS — Z7951 Long term (current) use of inhaled steroids: Secondary | ICD-10-CM

## 2017-05-30 DIAGNOSIS — E785 Hyperlipidemia, unspecified: Secondary | ICD-10-CM | POA: Diagnosis not present

## 2017-05-30 DIAGNOSIS — I878 Other specified disorders of veins: Secondary | ICD-10-CM | POA: Diagnosis present

## 2017-05-30 DIAGNOSIS — R748 Abnormal levels of other serum enzymes: Secondary | ICD-10-CM | POA: Diagnosis present

## 2017-05-30 DIAGNOSIS — R7989 Other specified abnormal findings of blood chemistry: Secondary | ICD-10-CM | POA: Diagnosis not present

## 2017-05-30 DIAGNOSIS — M545 Low back pain: Secondary | ICD-10-CM | POA: Diagnosis not present

## 2017-05-30 DIAGNOSIS — I5032 Chronic diastolic (congestive) heart failure: Secondary | ICD-10-CM | POA: Diagnosis not present

## 2017-05-30 DIAGNOSIS — K219 Gastro-esophageal reflux disease without esophagitis: Secondary | ICD-10-CM | POA: Diagnosis not present

## 2017-05-30 DIAGNOSIS — I480 Paroxysmal atrial fibrillation: Secondary | ICD-10-CM

## 2017-05-30 DIAGNOSIS — Z8349 Family history of other endocrine, nutritional and metabolic diseases: Secondary | ICD-10-CM | POA: Diagnosis not present

## 2017-05-30 DIAGNOSIS — I872 Venous insufficiency (chronic) (peripheral): Secondary | ICD-10-CM

## 2017-05-30 DIAGNOSIS — Z6839 Body mass index (BMI) 39.0-39.9, adult: Secondary | ICD-10-CM | POA: Diagnosis not present

## 2017-05-30 DIAGNOSIS — I110 Hypertensive heart disease with heart failure: Secondary | ICD-10-CM | POA: Diagnosis not present

## 2017-05-30 DIAGNOSIS — Z9114 Patient's other noncompliance with medication regimen: Secondary | ICD-10-CM

## 2017-05-30 DIAGNOSIS — E039 Hypothyroidism, unspecified: Secondary | ICD-10-CM | POA: Diagnosis not present

## 2017-05-30 DIAGNOSIS — J449 Chronic obstructive pulmonary disease, unspecified: Secondary | ICD-10-CM | POA: Diagnosis not present

## 2017-05-30 DIAGNOSIS — Z8249 Family history of ischemic heart disease and other diseases of the circulatory system: Secondary | ICD-10-CM | POA: Diagnosis not present

## 2017-05-30 DIAGNOSIS — Z86711 Personal history of pulmonary embolism: Secondary | ICD-10-CM | POA: Diagnosis not present

## 2017-05-30 DIAGNOSIS — J189 Pneumonia, unspecified organism: Secondary | ICD-10-CM | POA: Diagnosis not present

## 2017-05-30 DIAGNOSIS — L03116 Cellulitis of left lower limb: Secondary | ICD-10-CM | POA: Diagnosis present

## 2017-05-30 DIAGNOSIS — Z86718 Personal history of other venous thrombosis and embolism: Secondary | ICD-10-CM | POA: Diagnosis not present

## 2017-05-30 DIAGNOSIS — Z803 Family history of malignant neoplasm of breast: Secondary | ICD-10-CM | POA: Diagnosis not present

## 2017-05-30 DIAGNOSIS — I5030 Unspecified diastolic (congestive) heart failure: Secondary | ICD-10-CM | POA: Diagnosis not present

## 2017-05-30 DIAGNOSIS — F17210 Nicotine dependence, cigarettes, uncomplicated: Secondary | ICD-10-CM | POA: Diagnosis present

## 2017-05-30 DIAGNOSIS — J440 Chronic obstructive pulmonary disease with acute lower respiratory infection: Secondary | ICD-10-CM | POA: Diagnosis not present

## 2017-05-30 HISTORY — DX: Cellulitis of left lower limb: L03.116

## 2017-05-30 LAB — CBC
HCT: 32.3 % — ABNORMAL LOW (ref 36.0–46.0)
Hemoglobin: 10.8 g/dL — ABNORMAL LOW (ref 12.0–15.0)
MCH: 29.3 pg (ref 26.0–34.0)
MCHC: 33.4 g/dL (ref 30.0–36.0)
MCV: 87.5 fL (ref 78.0–100.0)
Platelets: 404 10*3/uL — ABNORMAL HIGH (ref 150–400)
RBC: 3.69 MIL/uL — ABNORMAL LOW (ref 3.87–5.11)
RDW: 15.6 % — ABNORMAL HIGH (ref 11.5–15.5)
WBC: 9.6 10*3/uL (ref 4.0–10.5)

## 2017-05-30 LAB — BASIC METABOLIC PANEL
Anion gap: 10 (ref 5–15)
BUN: 11 mg/dL (ref 6–20)
CO2: 26 mmol/L (ref 22–32)
Calcium: 8.3 mg/dL — ABNORMAL LOW (ref 8.9–10.3)
Chloride: 105 mmol/L (ref 101–111)
Creatinine, Ser: 0.98 mg/dL (ref 0.44–1.00)
GFR calc Af Amer: >60 mL/min (ref >60)
GFR calc non Af Amer: 58 mL/min — ABNORMAL LOW (ref >60)
Glucose, Bld: 163 mg/dL — ABNORMAL HIGH (ref 65–99)
Potassium: 3 mmol/L — ABNORMAL LOW (ref 3.5–5.1)
Sodium: 141 mmol/L (ref 135–145)

## 2017-05-30 LAB — TROPONIN I
Troponin I: 0.08 ng/mL (ref <0.03)
Troponin I: 0.07 ng/mL (ref <0.03)

## 2017-05-30 LAB — TSH: TSH: 1.609 u[IU]/mL (ref 0.350–4.500)

## 2017-05-30 LAB — MAGNESIUM: Magnesium: 1.7 mg/dL (ref 1.7–2.4)

## 2017-05-30 MED ORDER — IPRATROPIUM-ALBUTEROL 0.5-2.5 (3) MG/3ML IN SOLN
3.0000 mL | Freq: Four times a day (QID) | RESPIRATORY_TRACT | Status: DC
  Administered 2017-05-30: 3 mL via RESPIRATORY_TRACT
  Filled 2017-05-30 (×2): qty 3

## 2017-05-30 MED ORDER — POTASSIUM CHLORIDE CRYS ER 20 MEQ PO TBCR
40.0000 meq | EXTENDED_RELEASE_TABLET | Freq: Two times a day (BID) | ORAL | Status: AC
  Administered 2017-05-30 (×2): 40 meq via ORAL
  Filled 2017-05-30 (×2): qty 2

## 2017-05-30 MED ORDER — IPRATROPIUM-ALBUTEROL 0.5-2.5 (3) MG/3ML IN SOLN
3.0000 mL | Freq: Once | RESPIRATORY_TRACT | Status: AC
  Administered 2017-05-30: 3 mL via RESPIRATORY_TRACT
  Filled 2017-05-30: qty 3

## 2017-05-30 NOTE — H&P (Signed)
Internal Medicine Attending Admission Note Date: 05/30/2017  Patient name: Sharon Vasquez Medical record number: [id number] Date of birth: 23-Dec-1950 Age: 67 y.o. Gender: female  I saw and evaluated the patient. I reviewed the resident's note and I agree with the resident's findings and plan as documented in the resident's note.  Chief Complaint(s): Left lower extremity pain and swelling with redness 3 days.  History - key components related to admission:  Sharon Vasquez is a 67 year old woman with obesity, prior deep venous thrombosis, chronic venous insufficiency, chronic diastolic heart failure, and paroxysmal atrial fibrillation who presents with a three-day history of worsening lower extremity pain and swelling and new onset redness. She has chronic lower extremity edema and admits to only intermittently taking her torsemide secondary to cramping. She also admits to using her scooter most of the time and does very little ambulation with her walker or cane. This is associated with chronic lower extremity pain. She recently has presented to the emergency department and was placed on oral clindamycin. About one week ago the patient wrapped her left leg in a calamine lotion soaked bandage. When she removed the bandage she noted the leg looked more swollen and was painful and red. In addition, she noted the development of bulla many of which were ruptured. She therefore presented to the emergency department and was admitted to the internal medicine teaching service given the concern for a left lower extremity cellulitis that had failed oral antibiotic therapy.  Physical Exam - key components related to admission:  Vitals:   05/30/17 0800 05/30/17 0900 05/30/17 1000 05/30/17 1015  BP: 139/76 113/69 133/76   Pulse: 78 78  73  Resp:      Temp:      TempSrc:      SpO2: 96% 95%  97%   Gen.: Well-developed, well-nourished, morbidly obese woman lying comfortably on a hospital stretcher in no acute  distress. Left lower extremity: Erythema that begins approximately 1 cm below the patella. There are several bulla of the left lower extremity some of which have ruptured and some of which have yet to rupture. There is mild warmth to touch of the lower extremity on the left. The right lower extremity is also edematous but is without erythema and bulla.  Lab results:  Basic Metabolic Panel: Recent Labs    05/29/17 1301 05/30/17 0324  NA 139 141  K 3.2* 3.0*  CL 105 105  CO2 23 26  GLUCOSE 108* 163*  BUN 12 11  CREATININE 0.94 0.98  CALCIUM 8.8* 8.3*   CBC: Recent Labs    05/29/17 1301 05/30/17 0324  WBC 9.6 9.6  HGB 11.9* 10.8*  HCT 35.6* 32.3*  MCV 86.4 87.5  PLT 439* 404*   Cardiac Enzymes: Recent Labs    05/29/17 1415  TROPONINI 0.07*   Misc. Labs:  Blood cultures 2 pending Lactic acid 1.42  Imaging results:  Dg Chest 2 View  Result Date: 05/29/2017 CLINICAL DATA:  Shortness of Breath EXAM: CHEST  2 VIEW COMPARISON:  05/24/2017 FINDINGS: Cardiomegaly with vascular congestion. Interstitial prominence within the lungs, likely interstitial edema. No effusions or acute bony abnormality. IMPRESSION: Cardiomegaly, mild interstitial edema. Electronically Signed   By: Rolm Baptise M.D.   On: 05/29/2017 22:32   Dg Tibia/fibula Left  Result Date: 05/29/2017 CLINICAL DATA:  67 year old female with blisters of the left leg and left leg pain. EXAM: LEFT TIBIA AND FIBULA - 2 VIEW COMPARISON:  Left knee radiograph dated 05/24/2017 FINDINGS: There is  no acute fracture or dislocation. Mild osteopenia. There is moderate to severe osteoarthritic changes of the knee with involvement of the lateral, medial and patellofemoral compartments similar to prior radiograph. No significant joint effusion. There is diffuse subcutaneous edema. IMPRESSION: 1. No acute fracture or dislocation. 2. Tricompartmental osteoarthritis similar to prior radiograph. 3. Diffuse subcutaneous edema.  Electronically Signed   By: Anner Crete M.D.   On: 05/29/2017 22:35   Left knee and leg x-ray: Personally reviewed. Narrowing of the joint space of the knee with subchondral sclerosis. No bony abnormalities or soft tissue free air. There is subcutaneous edema.  AP portable semi-upright chest x-ray: Personally reviewed. Underpenetrated, no obvious effusions, infiltrates, or masses but the technique is suboptimal.  Other results:  EKG: Personally reviewed. Normal sinus rhythm at 68 bpm, left axis deviation, prolonged QTc at 514 ms, no significant Q waves, LVH by voltage in aVL, nonspecific ST and T-wave changes. There are no significant changes compared to the previous ECG on 04/17/2017 with the exception of the QTc prolongation.  Assessment & Plan by Problem:  Sharon Vasquez is a 67 year old woman with obesity, prior deep venous thrombosis, chronic venous insufficiency, chronic diastolic heart failure, and paroxysmal atrial fibrillation who presents with a three-day history of worsening lower extremity pain and swelling and new onset redness. With chronic venous insufficiency, noncompliance with her diuretics, little ambulation, chronic diastolic heart failure, and morbid obesity she is at risk for cellulitis of the lower extremities. It appears, after likely macerating the skin with a calamine lotion soaked bandage, she has developed a left lower extremity cellulitis. This apparently occurred while on clindamycin. In the short-term she seems to be clinically improving with the IV vancomycin. Long-term, it will be critical that this lower extremity edema be treated. That may require referral to wound care for Unna boots, continued consistent use of the loop diuretics, having her legs elevated when not ambulating, and walking for at least 10 minutes a day all to prevent future episodes of lower extremity cellulitis.  1) Left lower extremity cellulitis: We will provide her with an additional dose of  vancomycin today and likely switch to oral doxycycline thereafter to complete a 7 day course. We have advised her to walk at least 10 minutes a day as this is been shown to decrease recurrence of cellulitis. The importance of staying on her diuretics consistently was again stressed on rounds so as to decrease the fluid in her legs and therefore decrease the likelihood of reinfection. If ABIs have yet to be obtained we will get them during this visit which will allow [place] to expedite placement of the Unna boots and/or compression stockings as an outpatient.  2) Slightly elevated troponin: She always has a low level elevation in her troponin and this admission is no different. Clinically, she is felt to have a component of acute on chronic diastolic heart failure. She was restarted on her loop diuretics in hopes of controlling her volume. My suspicion is that the slightly elevated troponin is related to the acute on chronic heart failure. That said, we will obtain another troponin to make sure there is no upward trajectory.  3) Disposition: I anticipate she will be stable for discharge either this evening or tomorrow. We will complete the above evaluation with the repeat troponin and ABI, if necessary, as noted above. Follow-up will be in the [facility] with her primary care provider.

## 2017-05-30 NOTE — Progress Notes (Signed)
Received report on pt.

## 2017-05-30 NOTE — Progress Notes (Signed)
Internal Medicine Attending  Date: 05/30/2017  Patient name: Sharon Vasquez Medical record number: [id number] Date of birth: 02/23/1951 Age: 67 y.o. Gender: female  I saw and evaluated the patient. I reviewed the resident's note by Dr. Shan Levans and I agree with the resident's findings and plans as documented in his progress note.  Please see my H&P dated 05/30/2017 for the specifics of my evaluation, assessment, and plan from earlier in the day.

## 2017-05-30 NOTE — Progress Notes (Signed)
Sharon Vasquez is a 67 y.o. female patient admitted from ED awake, alert - oriented  X 4 - no acute distress noted.  VSS - Blood pressure 120/63, pulse 73, temperature 99.3 F (37.4 C), temperature source Oral, resp. rate 16, SpO2 100 %.    IV in place, occlusive dsg intact without redness.  Orientation to room, and floor completed with information packet given to patient/family.  Patient declined safety video at this time.  Admission INP armband ID verified with patient/family, and in place.   SR up x 2, fall assessment complete, with patient and family able to verbalize understanding of risk associated with falls, and verbalized understanding to call nsg before up out of bed.  Call light within reach, patient able to voice, and demonstrate understanding.  Skin, clean-dry- intact without evidence of bruising, or skin tears. Pt has blister on her LLE and redness  No evidence of skin break down noted on exam.     Will cont to eval and treat per MD orders.  Celine Ahr, RN 05/30/2017 2:27 PM

## 2017-05-30 NOTE — Progress Notes (Addendum)
   Subjective: Patient reports her leg feeling better feels a little less swollen and painful to her.  She was still wheezing audibly, she did not feel that it was any worse from yesterday, she had not had a breathing treatment yet this morning.    Objective:  Vital signs in last 24 hours: Vitals:   05/30/17 0800 05/30/17 0900 05/30/17 1000 05/30/17 1015  BP: 139/76 113/69 133/76   Pulse: 78 78  73  Resp:      Temp:      TempSrc:      SpO2: 96% 95%  97%   Physical Exam  Constitutional: She is oriented to person, place, and time. No distress.  HENT:  Head: Normocephalic and atraumatic.  Neck: Normal range of motion. Neck supple.  Cardiovascular: Normal rate, regular rhythm and normal heart sounds. Exam reveals no gallop and no friction rub.  No murmur heard. Pulmonary/Chest: Effort normal. No respiratory distress. She has wheezes (diffuse wheezing noted throughout). She has no rales. She exhibits no tenderness.  Abdominal: Soft. Bowel sounds are normal. She exhibits no distension and no mass. There is no tenderness. There is no rebound and no guarding.  Musculoskeletal:       Right lower leg: She exhibits tenderness and edema (3+).       Left lower leg: She exhibits tenderness and edema (3+).  Neurological: She is alert and oriented to person, place, and time.  Skin: She is not diaphoretic.  LLE very warm, erythematous, less painful to touch, erythema regressing from yesterday now more distal and lighter in color, leg less edematous than day prior, leg and foot too swollen to appreciate pulses DP or PT. Several bullae have ruptured, no active drainage present as compared to yesterday. RLE very edematous and painful to touch     Media Information   Document Information   Photos    05/30/2017 07:57  Attached To:  Hospital Encounter on 05/29/17  Source Information   Kunaal Walkins, Jenne Pane, MD  [facility]     Assessment/Plan:  Active Problems:   Left leg  swelling  LLE cellulitis Pt with likely LLE cellulitis, continues to mismanage the care of her leg and taker her medications intermittently.  No systemic signs of infection at this time.  Likely a combination of venous stasis but also heart failure a major factor. Venous doppler negative for DVT.   -continue vancomycin will transition to doxycycline on discharge -continue torsemide 20mg  BID  -will check TSH to confirm hypothyroidism not contributing -ABIs performed on 11/20/16 show 1.24 on L and R 1.16   COPD No PFT's on file, pt on Advair, Spriva and albuterol rescue inhaler.  Diffuse wheezing on admission, unsure of compliance with inhalers    -continue Spiriva, Advair -scheduled duoneb therapy today q6 hours -PRN albuterol nebs q 4 hours    HFpEF 2018 ECHO was not able to evaluate diastolic function, EF 86-76%, moderately dilated left atrium Weight has fluctuated but trended up over the last year   -torsemide 20mg  BID -pt with chronic troponin leak 2/2 heart failure, will recheck troponin before discharge to ensure no further rise   PAF In sinus rhythm on admission   -continue xarelto 20mg  daily -continue amiodarone 200mg  daily     Dispo: Anticipated discharge later today vs tomorrow.   Katherine Roan, MD 05/30/2017, 11:03 AM Vickki Muff MD PGY-1 Internal Medicine Pager # [phone]

## 2017-05-30 NOTE — ED Notes (Signed)
Lunch tray Ordered

## 2017-05-30 NOTE — Discharge Instructions (Signed)
Information on my medicine - XARELTO® (rivaroxaban) ° °This medication education was reviewed with me or my healthcare representative as part of my discharge preparation.  The pharmacist that spoke with me during my hospital stay was:  Cherrill Scrima Dien, RPH ° °WHY WAS XARELTO® PRESCRIBED FOR YOU? °Xarelto® was prescribed to treat blood clots that may have been found in the veins of your legs (deep vein thrombosis) or in your lungs (pulmonary embolism) and to reduce the risk of them occurring again. ° °What do you need to know about Xarelto®? °The dose is one 20 mg tablet taken ONCE A DAY with your evening meal. ° °DO NOT stop taking Xarelto® without talking to the health care provider who prescribed the medication.  Refill your prescription for 20 mg tablets before you run out. ° °After discharge, you should have regular check-up appointments with your healthcare provider that is prescribing your Xarelto®.  In the future your dose may need to be changed if your kidney function changes by a significant amount. ° °What do you do if you miss a dose? °If you are taking Xarelto® TWICE DAILY and you miss a dose, take it as soon as you remember. You may take two 15 mg tablets (total 30 mg) at the same time then resume your regularly scheduled 15 mg twice daily the next day. ° °If you are taking Xarelto® ONCE DAILY and you miss a dose, take it as soon as you remember on the same day then continue your regularly scheduled once daily regimen the next day. Do not take two doses of Xarelto® at the same time.  ° °Important Safety Information °Xarelto® is a blood thinner medicine that can cause bleeding. You should call your healthcare provider right away if you experience any of the following: °? Bleeding from an injury or your nose that does not stop. °? Unusual colored urine (red or dark brown) or unusual colored stools (red or black). °? Unusual bruising for unknown reasons. °? A serious fall or if you hit your head (even if  there is no bleeding). ° °Some medicines may interact with Xarelto® and might increase your risk of bleeding while on Xarelto®. To help avoid this, consult your healthcare provider or pharmacist prior to using any new prescription or non-prescription medications, including herbals, vitamins, non-steroidal anti-inflammatory drugs (NSAIDs) and supplements. ° °This website has more information on Xarelto®: www.xarelto.com. °

## 2017-05-30 NOTE — Telephone Encounter (Signed)
HOSPITAL TOC per dr Reesa Chew, discharge 05/30/2017.

## 2017-05-31 ENCOUNTER — Other Ambulatory Visit: Payer: Self-pay | Admitting: Internal Medicine

## 2017-05-31 DIAGNOSIS — E039 Hypothyroidism, unspecified: Secondary | ICD-10-CM

## 2017-05-31 DIAGNOSIS — I5030 Unspecified diastolic (congestive) heart failure: Secondary | ICD-10-CM

## 2017-05-31 DIAGNOSIS — Z6839 Body mass index (BMI) 39.0-39.9, adult: Secondary | ICD-10-CM

## 2017-05-31 LAB — BASIC METABOLIC PANEL
Anion gap: 13 (ref 5–15)
BUN: 10 mg/dL (ref 6–20)
CO2: 29 mmol/L (ref 22–32)
Calcium: 8.4 mg/dL — ABNORMAL LOW (ref 8.9–10.3)
Chloride: 99 mmol/L — ABNORMAL LOW (ref 101–111)
Creatinine, Ser: 0.97 mg/dL (ref 0.44–1.00)
GFR calc Af Amer: >60 mL/min (ref >60)
GFR calc non Af Amer: 59 mL/min — ABNORMAL LOW (ref >60)
Glucose, Bld: 110 mg/dL — ABNORMAL HIGH (ref 65–99)
Potassium: 3.1 mmol/L — ABNORMAL LOW (ref 3.5–5.1)
Sodium: 141 mmol/L (ref 135–145)

## 2017-05-31 LAB — CBC
HCT: 34.2 % — ABNORMAL LOW (ref 36.0–46.0)
Hemoglobin: 11.4 g/dL — ABNORMAL LOW (ref 12.0–15.0)
MCH: 29.2 pg (ref 26.0–34.0)
MCHC: 33.3 g/dL (ref 30.0–36.0)
MCV: 87.5 fL (ref 78.0–100.0)
Platelets: 418 10*3/uL — ABNORMAL HIGH (ref 150–400)
RBC: 3.91 MIL/uL (ref 3.87–5.11)
RDW: 15.5 % (ref 11.5–15.5)
WBC: 8.5 10*3/uL (ref 4.0–10.5)

## 2017-05-31 MED ORDER — DOXYCYCLINE HYCLATE 50 MG PO CAPS: 100.0000 mg | ORAL_CAPSULE | Freq: Two times a day (BID) | ORAL | 0 refills | Status: AC

## 2017-05-31 MED ORDER — POTASSIUM CHLORIDE CRYS ER 20 MEQ PO TBCR
40.0000 meq | EXTENDED_RELEASE_TABLET | Freq: Two times a day (BID) | ORAL | Status: DC
  Administered 2017-05-31: 40 meq via ORAL
  Filled 2017-05-31: qty 2

## 2017-05-31 MED ORDER — POTASSIUM CHLORIDE CRYS ER 20 MEQ PO TBCR
40.0000 meq | EXTENDED_RELEASE_TABLET | Freq: Once | ORAL | Status: AC
  Administered 2017-05-31: 40 meq via ORAL
  Filled 2017-05-31: qty 2

## 2017-05-31 MED ORDER — TORSEMIDE 20 MG PO TABS: 20.0000 mg | ORAL_TABLET | Freq: Every day | ORAL | 0 refills | Status: DC

## 2017-05-31 MED ORDER — TORSEMIDE 20 MG PO TABS: 20.0000 mg | ORAL_TABLET | Freq: Every day | ORAL | Status: DC

## 2017-05-31 MED ORDER — POTASSIUM CHLORIDE CRYS ER 10 MEQ PO TBCR: 20.0000 meq | EXTENDED_RELEASE_TABLET | Freq: Two times a day (BID) | ORAL | 2 refills | Status: DC

## 2017-05-31 MED ORDER — AMOXICILLIN 875 MG PO TABS: 875.0000 mg | ORAL_TABLET | Freq: Two times a day (BID) | ORAL | 0 refills | Status: AC

## 2017-05-31 NOTE — Discharge Summary (Signed)
Name: Sharon Vasquez [name]: [id number] DOB: January 03, 1951 67 y.o. PCP: Lucious Groves, DO  Date of Admission: 05/29/2017  3:39 PM Date of Discharge: 05/31/17 Attending Physician: Oval Linsey, MD  Discharge Diagnosis: 1. Active Problems:   Morbid obesity ([place])   Cellulitis of left lower extremity   Left leg swelling   Discharge Medications: Allergies as of 05/31/2017   No Known Allergies     Medication List    STOP taking these medications   clindamycin 150 MG capsule Commonly known as:  CLEOCIN   DULoxetine 30 MG capsule Commonly known as:  CYMBALTA     TAKE these medications   albuterol 108 (90 Base) MCG/ACT inhaler Commonly known as:  PROAIR HFA Inhale 2 puffs into the lungs every 6 (six) hours as needed for wheezing or shortness of breath.   albuterol (2.5 MG/3ML) 0.083% nebulizer solution Commonly known as:  PROVENTIL Take 3 mLs (2.5 mg total) by nebulization every 4 (four) hours as needed for wheezing or shortness of breath.   amiodarone 200 MG tablet Commonly known as:  PACERONE TAKE 1 TABLET BY MOUTH EVERY DAY What changed:    how much to take  how to take this  when to take this   amoxicillin 875 MG tablet Commonly known as:  AMOXIL Take 1 tablet (875 mg total) by mouth 2 (two) times daily for 5 days.   aspirin EC 81 MG tablet Take 81 mg by mouth daily.   atorvastatin 40 MG tablet Commonly known as:  LIPITOR Take 1 tablet (40 mg total) by mouth daily.   benazepril 40 MG tablet Commonly known as:  LOTENSIN Take 1 tablet (40 mg total) by mouth daily.   doxycycline 50 MG capsule Commonly known as:  VIBRAMYCIN Take 2 capsules (100 mg total) by mouth 2 (two) times daily for 5 days.   esomeprazole 40 MG capsule Commonly known as:  NEXIUM Take 1 capsule (40 mg total) daily as needed by mouth (for heartburn or indigestion).   Fluticasone-Salmeterol 250-50 MCG/DOSE Aepb Commonly known as:  ADVAIR DISKUS Inhale 1 puff into the lungs 2 (two)  times daily. Rinse mouth after each use   gabapentin 400 MG capsule Commonly known as:  NEURONTIN Take 1 capsule (400 mg total) by mouth daily. What changed:  when to take this   nicotine 21 mg/24hr patch Commonly known as:  NICODERM CQ - dosed in mg/24 hours Place 1 patch (21 mg total) onto the skin daily.   oxyCODONE-acetaminophen 5-325 MG tablet Commonly known as:  PERCOCET/ROXICET Take 1 tablet by mouth 2 (two) times daily as needed for severe pain.   potassium chloride 10 MEQ tablet Commonly known as:  K-DUR,KLOR-CON Take 2 tablets (20 mEq total) by mouth 2 (two) times daily. What changed:  how much to take   rivaroxaban 20 MG Tabs tablet Commonly known as:  XARELTO Take 1 tablet (20 mg total) by mouth every morning.   tiotropium 18 MCG inhalation capsule Commonly known as:  SPIRIVA HANDIHALER Place 1 capsule (18 mcg total) into inhaler and inhale daily.   torsemide 20 MG tablet Commonly known as:  DEMADEX Take 1 tablet (20 mg total) by mouth daily. Start taking on:  06/01/2017 What changed:  when to take this       Disposition and follow-up:   Sharon Vasquez was discharged from [facility] in stable condition.  At the hospital follow up visit please address:  LLE cellulitis Chronic venous insufficiency HFpEF  -  ensure pts LLE cellulitis resolving, and pt taking abx, admission picture and discharge picture posted below -assess volume status,ensure pt taking diuretic therapy daily, adjust diuretic therapy as needed, get bmp -adjust potassium supplementation if needed -follow resolution of opened bullae and consider unna boots   COPD  -pt was not compliant with inhaler therapy and had severe wheezing on admission, emphasize importance of maintenance therapy -consider potential switch from amiodarone due to severe COPD and high risk for developing pulmonary fibrosis    2.  Labs / imaging needed at time of follow-up: bmp, cbc  3.  Pending  labs/ test needing follow-up: none  Follow-up Appointments: Follow-up Information    Lucious Groves, DO. Go on 06/04/2017.   Specialty:  Internal Medicine Why:  At 2:45 PM Contact information: [place] [address] [place] Hospital Course by problem list: Active Problems:   Morbid obesity ([place])   Cellulitis of left lower extremity   Left leg swelling   Patient arrived to the emergency department with worsening left lower extremity pain, edema, erythema, ruptured bullae that were weeping fluid and was found to have a left lower extremity cellulitis.  She was not taking her oral diuretic therapy as prescribed and had only started taking it for the last 3 days before coming to the ED. she was started on vancomycin, her oral torsemide dose was reinstituted.  She had profound urine output her lower extremity edema improved greatly, the appearance of her leg also greatly improved from an erythematous standpoint and her pain proved with it.  Additionally her COPD had been poorly controlled and she was wheezing extensively having difficulty speaking in full sentences.  We gave her scheduled breathing treatments, by the end of her stay she was minimally wheezing.  She was transitioned to doxycycline and amoxicillin for 5 days further of antibiotic therapy.  She had a close follow-up in our clinic scheduled for early the next week.  She was also discharged with a referral for home health to help her manage the wound and her lack of mobility due to the infected leg.  We also checked a TSH to make sure her hypothyroidism was not a contributing factor.  Incidentally, while she was in the emergency department a troponin was checked, the reason for this was never made certain as the patient was experiencing no chest pain and was here for a left lower extremity cellulitis.  He came back slightly elevated as it always does due to the patient having chronic troponin leak.  We  therefore trended the troponin which stayed flat and there was never any chest pain or concern for any acute coronary syndrome.    Discharge Vitals:   BP 125/64   Pulse 77   Temp 98.5 F (36.9 C) (Oral)   Resp 20   Ht 6\' 2"  (1.88 m)   Wt (!) 310 lb 13.6 oz (141 kg)   SpO2 98%   BMI 39.91 kg/m   Pertinent Labs, Studies, and Procedures:   Lab Results  Component Value Date   TSH 1.609 05/30/2017   CBC Latest Ref Rng & Units 05/31/2017 05/30/2017 05/29/2017  WBC 4.0 - 10.5 K/uL 8.5 9.6 9.6  Hemoglobin 12.0 - 15.0 g/dL 11.4(L) 10.8(L) 11.9(L)  Hematocrit 36.0 - 46.0 % 34.2(L) 32.3(L) 35.6(L)  Platelets 150 - 400 K/uL 418(H) 404(H) 439(H)   BMP Latest Ref Rng & Units 05/31/2017 05/30/2017 05/29/2017  Glucose 65 -  99 mg/dL 110(H) 163(H) 108(H)  BUN 6 - 20 mg/dL 10 11 12   Creatinine 0.44 - 1.00 mg/dL 0.97 0.98 0.94  BUN/Creat Ratio 12 - 28 - - -  Sodium 135 - 145 mmol/L 141 141 139  Potassium 3.5 - 5.1 mmol/L 3.1(L) 3.0(L) 3.2(L)  Chloride 101 - 111 mmol/L 99(L) 105 105  CO2 22 - 32 mmol/L 29 26 23   Calcium 8.9 - 10.3 mg/dL 8.4(L) 8.3(L) 8.8(L)   Lab Results  Component Value Date   TROPONINI 0.07 ([place]) 05/30/2017   Admission photo: Media Information   Document Information   Photos    05/29/2017 17:20  Attached To:  Hospital Encounter on 05/29/17  Source Information   Yurem Viner, Jenne Pane, MD  [facility]    Discharge Photo:   Media Information   Document Information   Photos    05/31/2017 07:54  Attached To:  Hospital Encounter on 05/29/17  Source Information   Roneka Gilpin, Jenne Pane, MD  [facility]     Discharge Instructions: Discharge Instructions    Call MD for:  difficulty breathing, headache or visual disturbances   Complete by:  As directed    Call MD for:  extreme fatigue   Complete by:  As directed    Call MD for:  hives   Complete by:  As directed    Call MD for:  persistant dizziness or light-headedness   Complete by:  As  directed    Call MD for:  persistant nausea and vomiting   Complete by:  As directed    Call MD for:  redness, tenderness, or signs of infection (pain, swelling, redness, odor or green/yellow discharge around incision site)   Complete by:  As directed    Call MD for:  severe uncontrolled pain   Complete by:  As directed    Call MD for:  temperature >100.4   Complete by:  As directed    Diet - low sodium heart healthy   Complete by:  As directed    Discharge instructions   Complete by:  As directed    Ms. Rosana Hoes, we have changed some of your medications please take 20mg  of your torsemide daily.  Also take 35meq of potassium twice daily which adds up to a total of 63meq daily.  You must take the torsemide and potassium together, if you do not take the torsemide you do not need the potassium but please take both of these daily unless told otherwise at your follow up appointment on Monday 06/04/17 at 2:45pm in our clinic.  Additionally, I have started you on two oral antibiotics doxycycline and amoxicillin.  You will take a 5 day course of these antibiotics as prescribed.  I have ordered a home health aid and nurse to help you with managing your leg, until you're back on your feet.  Please be sure and see [place] in the clinic on Monday.  If you notice you begin to develop fever and chills, begin to feel very ill and the leg begins to worsen please seek medical attention before your appointment but we do not expect this to happen.   Face-to-face encounter (required for Medicare/Medicaid patients)   Complete by:  As directed    I Vickki Muff certify that this patient is under my care and that I, or a nurse practitioner or physician's assistant working with me, had a face-to-face encounter that meets the physician face-to-face encounter requirements with this patient on 05/31/2017. The encounter with the patient  was in whole, or in part for the following medical condition(s) which is the primary reason for  home health care  Left lower extremity cellulitis, Heart failure with preserved ejection fraction, osteoarthritis   The encounter with the patient was in whole, or in part, for the following medical condition, which is the primary reason for home health care:  left lower extremity cellulitis, severe osteoarthritis, Heart failure with preserved ejection fraction   I certify that, based on my findings, the following services are medically necessary home health services:  Nursing   Reason for Medically Necessary Home Health Services:  Skilled Nursing- Complex Wound Care   My clinical findings support the need for the above services:  Pain interferes with ambulation/mobility   Further, I certify that my clinical findings support that this patient is homebound due to:   Open/draining pressure/stasis ulcer Ambulates short distances less than 300 feet     Home Health   Complete by:  As directed    To provide the following care/treatments:   [place] RN     Increase activity slowly   Complete by:  As directed       Signed: Katherine Roan, MD 05/31/2017, 2:09 PM

## 2017-05-31 NOTE — Care Management Note (Addendum)
Case Management Note  Patient Details  Name: Sharon Vasquez [name]: [id number] Date of Birth: [birth year]  Subjective/Objective:         Presented with Left lower extremity pain and swelling with redness. From home alone. Pt states children very supportive and assist with caring for her. PTA independent with ADL's. DME; scooter, walker, cane.  PCP: Joni Reining  Action/Plan: Transition to home...Marland KitchenPT evaluation pending... CM to f/u with  Transitional care needs. Family to provide transportation to home.  Expected Discharge Date:   05/31/2017             Expected Discharge Plan:  Home/Self Care  In-House Referral:     Discharge planning Services  CM Consult  Post Acute Care Choice:    Choice offered to:   patient  DME Arranged:    DME Agency:     HH Arranged:   RN,NA HH Agency:   [facility]  Status of Service:  completed  If discussed at Long Length of Stay Meetings, dates discussed:    Additional Comments:  Sharin Mons, RN 05/31/2017, 2:01 PM

## 2017-05-31 NOTE — Progress Notes (Signed)
CSW acknowledges consult regarding patient needing help at home. CSW notes RNCM following for home needs.   CSW signing off.  Percell Locus Kaesha Kirsch LCSW [phone]

## 2017-05-31 NOTE — Progress Notes (Signed)
Internal Medicine Attending  Date: 05/31/2017  Patient name: Sharon Vasquez Medical record number: [id number] Date of birth: 06-Mar-1951 Age: 67 y.o. Gender: female  I saw and evaluated the patient. I reviewed the resident's note by Dr. Shan Levans and I agree with the resident's findings and plans as documented in his progress note.  When seen on rounds this morning Sharon Vasquez' left lower extremity looked much improved from the day before. It was less edematous and less erythematous although she continued to have bullae. She is stable for discharge home today on oral antibiotics with follow-up in the [facility]. The importance of compliance with her diuretics was once again stressed. Ultimately, she will need her lower extremity edema further addressed with Unna boots and this can be arranged as an outpatient, especially given her unremarkable ABIs.

## 2017-05-31 NOTE — Progress Notes (Signed)
Subjective: Patient reports continued improvement in her leg pain and appearance.  She noticed improvement in her wheezing as well.  She denies feeling short of breath, having chest pain, nausea or vomiting.    Objective:  Vital signs in last 24 hours: Vitals:   05/30/17 1448 05/30/17 2039 05/31/17 0127 05/31/17 0650  BP: (!) 153/77 138/83  (!) 161/88  Pulse: (!) 54 72  70  Resp: 14 (!) 29  20  Temp: 98.5 F (36.9 C) 99.3 F (37.4 C)  98.6 F (37 C)  TempSrc: Oral Oral  Oral  SpO2: 94% 93%  95%  Weight:   (!) 312 lb 13.3 oz (141.9 kg) (!) 310 lb 13.6 oz (141 kg)  Height:   6\' 2"  (1.88 m)    Physical Exam  Constitutional: She is oriented to person, place, and time. No distress.  HENT:  Head: Normocephalic and atraumatic.  Neck: Normal range of motion. Neck supple.  Cardiovascular: Normal rate, regular rhythm and normal heart sounds. Exam reveals no gallop and no friction rub.  No murmur heard. Pulmonary/Chest: Effort normal. No respiratory distress. She has mild wheezing throughout with good interval improvement She has no rales. She exhibits no tenderness.  Abdominal: Soft. Bowel sounds are normal. She exhibits no distension and no mass. There is no tenderness. There is no rebound and no guarding.  Musculoskeletal:       Right lower leg: She exhibits tenderness and edema (3+) with interval improvement.       Left lower leg: She exhibits tenderness and edema (3+) with interval improvement.  Neurological: She is alert and oriented to person, place, and time.  Skin: She is not diaphoretic.  LLE very warm, erythematous, less painful to touch, erythema regressing from yesterday now more distal and lighter in color, leg less edematous than day prior, leg and foot too swollen to appreciate pulses DP or PT. Several new bullae have formed and contain fluid. RLE very edematous and painful to touch     Media Information   Document Information   Photos    05/31/2017 07:55  Attached  To:  Hospital Encounter on 05/29/17  Source Information   Shereka Lafortune, Jenne Pane, MD  [facility]   Media Information   Document Information   Photos    05/31/2017 07:54  Attached To:  Hospital Encounter on 05/29/17  Source Information   Monea Pesantez, Jenne Pane, MD  [facility]       Assessment/Plan:  Active Problems:   Morbid obesity ([place])   Cellulitis of left lower extremity   Left leg swelling  LLE cellulitis Pt with likely LLE cellulitis, continues to mismanage the care of her leg and taker her medications intermittently.  No systemic signs of infection at this time.  Likely a combination of venous stasis but also heart failure a major factor. Venous doppler negative for DVT.   -continued interval improvement in cellulitis -One last dose of vancomycin this morning, transitioned pt to doxycycline and amoxicillin on discharge -continue torsemide 20mg  Bid will discharge on 20mg  daily -ABIs performed on 11/20/16 show 1.24 on L and R 1.16   COPD No PFT's on file, pt on Advair, Spriva and albuterol rescue inhaler.  Diffuse wheezing on admission, unsure of compliance with inhalers    -continue Spiriva, Advair -scheduled duoneb therapy today q6 hours -PRN albuterol nebs q 4 hours    HFpEF 2018 ECHO was not able to evaluate diastolic function, EF 41-96%, moderately dilated left atrium Weight has  fluctuated but trended up over the last year   -torsemide 20mg  BID will discharge on 20mg  daily as pt taking PRN prior to admission -troponins remain stable, pt has chronic leak from heart failure, no chest pain during admission or in the ED  PAF In sinus rhythm on admission   -continue xarelto 20mg  daily -continue amiodarone 200mg  daily     Dispo: Anticipated discharge later today   Katherine Roan, MD 05/31/2017, 12:52 PM Vickki Muff MD PGY-1 Internal Medicine Pager # [phone]

## 2017-06-01 NOTE — Telephone Encounter (Signed)
90-day supply request denied--pt has upcoming HFU visit on 03/04... Will address at that time.Regenia Skeeter, Darlene Cassady3/1/20199:09 AM

## 2017-06-03 LAB — CULTURE, BLOOD (ROUTINE X 2)
Culture: NO GROWTH
Culture: NO GROWTH
Special Requests: ADEQUATE
Special Requests: ADEQUATE

## 2017-06-04 ENCOUNTER — Ambulatory Visit (INDEPENDENT_AMBULATORY_CARE_PROVIDER_SITE_OTHER): Payer: Medicare Other | Admitting: Internal Medicine

## 2017-06-04 ENCOUNTER — Other Ambulatory Visit: Payer: Self-pay

## 2017-06-04 ENCOUNTER — Encounter: Payer: Self-pay | Admitting: Internal Medicine

## 2017-06-04 VITALS — BP 130/84 | HR 91 | Temp 98.3°F | Ht 74.0 in | Wt 312.2 lb

## 2017-06-04 DIAGNOSIS — L03116 Cellulitis of left lower limb: Secondary | ICD-10-CM

## 2017-06-04 DIAGNOSIS — Z79899 Other long term (current) drug therapy: Secondary | ICD-10-CM | POA: Diagnosis not present

## 2017-06-04 DIAGNOSIS — I5030 Unspecified diastolic (congestive) heart failure: Secondary | ICD-10-CM | POA: Diagnosis not present

## 2017-06-04 DIAGNOSIS — I872 Venous insufficiency (chronic) (peripheral): Secondary | ICD-10-CM

## 2017-06-04 DIAGNOSIS — Z7951 Long term (current) use of inhaled steroids: Secondary | ICD-10-CM | POA: Diagnosis not present

## 2017-06-04 DIAGNOSIS — R234 Changes in skin texture: Secondary | ICD-10-CM

## 2017-06-04 DIAGNOSIS — Z9114 Patient's other noncompliance with medication regimen: Secondary | ICD-10-CM

## 2017-06-04 DIAGNOSIS — J449 Chronic obstructive pulmonary disease, unspecified: Secondary | ICD-10-CM | POA: Diagnosis not present

## 2017-06-04 NOTE — Patient Instructions (Addendum)
Thank you for allowing [place] to care for you  For your cellulitis (Skin infection) of your left leg: - Finish your course of antibiotics - Continue with would care  For your leg swelling: - Continue daily torsemide - Continue with would care and leg wrapping - Do not wrap left leg tightly until wound is healed  For COPD - Continue to take daily inhalers every day - Use albuterol as needed for wheezing and shortness of breath  We have gotten a lab to check your kidney function and electrolytes as you are taking daily torsemide.  Please return for follow up with your PCP in 4-6 weeks

## 2017-06-04 NOTE — Assessment & Plan Note (Addendum)
Patient recently admited with cellulitis and lower extremity edema. Edema due in part to only intermittent adherance with home diretics. Patient was diuresed well inpatient with a discharge weight of 310lbs. She was discharge on Torsemide 20mg  Daily, which she states she has been taking. Her weight remains stable today at 312lbs and her blood pressure is improved at 130/84. Patient to have labs today to monitor electrolytes and real function. - Continue Torsemide 20mg  Daily - Continue KCl 60mEq Daily - BMET today  ADDENDUM 06/05/17: BMET showed elevated BUN of 35 and Cr of 1.92 (up from 0.97 at discharge 5 days prior); indicating AKI in the setting of daily diuretic use (she had not previously been using this medication daily). Patient called and informed of her abnormal lab results  Patient instructed to stop her Torsemide at this time and set an appointment in [facility] for Monday or Tuesday of next week to evaluate for resolution of her AKI. At that time, if renal function is improved, we will consider restarting Torsemide at 10mg  Daily with follow up to evaluate the effect of lowered dose.

## 2017-06-04 NOTE — Addendum Note (Signed)
Addended by: Oval Linsey D on: 06/04/2017 07:42 PM   Modules accepted: Level of Service

## 2017-06-04 NOTE — Assessment & Plan Note (Signed)
Patient noted to be audibly wheezy on presentation during recent admission. She had reportedly not been using her inhalers regularly. Her breathing improved with scheduled breathing treatments. Today she reports she is taking her Advair and Spiriva Daily. She also has albuterol rescue inhaler. Trace end expiratory wheezing on exam today. She denies shortness of breath. - Advair 1 puff BID - Spiriva 56mcg Daily - Albuterol PRN, wheezing / dyspnea

## 2017-06-04 NOTE — Assessment & Plan Note (Signed)
Patient admitted 2/26-2/28 with LLE cellulitis and edema. She had previously been seen in the ED on 2/21 and was prescribed a course of clindamycin. She had wrapped her leg in calamine lotion soaked bandages and upon removal of the bandages noted her leg to be more swollen and red and returned to the ED. She was started on vancomycin in the ED and then transitioned to oral doxycyline and amoxicillin and discharged on additional 5 day course of both.  On exam today, her leg appear back to baseline in terms of coloration and has several areas of desquamation following rupture of flaccid bullae noted during admission. (see attache image). Appears to be healing. She has been taking her antibiotics and will complete her course today. She denies any fevers or chills. We will provide her with a dressing to serve as an example for her home health nurse who is coming tomorrow. - [place] course of doxycyline and amoxicillin - Wound care provided today in the clinic and will be maintained by home health - Patient instructed to keep would clean and dry and avoid compressive dressings while healing

## 2017-06-04 NOTE — Assessment & Plan Note (Signed)
>>  ASSESSMENT AND PLAN FOR DIASTOLIC HEART FAILURE (HCC) WRITTEN ON 06/05/2017  2:16 PM BY MELVIN, Lyn Hollingshead, MD  Patient recently admited with cellulitis and lower extremity edema. Edema due in part to only intermittent adherance with home diretics. Patient was diuresed well inpatient with a discharge weight of 310lbs. She was discharge on Torsemide 20mg  Daily, which she states she has been taking. Her weight remains stable today at 312lbs and her blood pressure is improved at 130/84. Patient to have labs today to monitor electrolytes and real function. - Continue Torsemide 20mg  Daily - Continue KCl Daily - BMET today  ADDENDUM 06/05/17: BMET showed elevated BUN of 35 and Cr of 1.92 (up from 0.97 at discharge 5 days prior); indicating AKI in the setting of daily diuretic use (she had not previously been using this medication daily). Patient called and informed of her abnormal lab results  Patient instructed to stop her Torsemide at this time and set an appointment in [facility] for Monday or Tuesday of next week to evaluate for resolution of her AKI. At that time, if renal function is improved, we will consider restarting Torsemide at 10mg  Daily with follow up to evaluate the effect of lowered dose.

## 2017-06-04 NOTE — Progress Notes (Signed)
   CC: Cellulitis, Heart failure, Chronic venous insufficiency, hospital follow up  HPI:  [name] is a 67 y.o. F with PMHx listed below presenting for Cellulitis, Heart failure, Chronic venous insufficiency, hospital follow up. Please see the A&P for the status of the patient's chronic medical problems.  Patient admitted 2/26-2/28 with LLE cellulitis and edema. She had previously been seen in the ED on 2/21 and was prescribed a course of clindamycin. She had wrapped her leg in calamine lotion soaked bandages and upon removal of the bandages noted her leg to be more swollen and red. Edema believed to be due to combination of volume overload due to not taking her diuretic every day as prescribed and her chronic venous insufficiency. For the edema she received diuretics as she had not been taking her home diuretics as prescribed. For her cellulitis she was started on vancomycin in the ED and then transitioned to oral doxycyline and amoxicillin. She was additionally noted to be wheezing on presentation and had not been using her home inhalers for COPD as prescribed. Her wheezing improved during the course of her stay with scheduled breathing treatments. She was discharged on doxycyline and amoxicillin for 5 additional days for her cellulitis and torsemide 20mg  daily for her edema due heart failure. She was to continue her home inhalers for COPD.   Past Medical History:  Diagnosis Date  . A-fib ([place])   . Anxiety   . Arthritis    "qwhre; joints, back" (04/17/2017)  . Benign breast cyst in female, left 01/08/2017   Found by Screening mammogram, evaluated by U/S on 01/08/17 and determined to be a benign simple breast cyst.  . Cellulitis of left lower leg 05/30/2017  . CHF (congestive heart failure) ([place])   . Chronic lower back pain   . Chronic venous insufficiency    [name] 05/30/2017  . COPD (chronic obstructive pulmonary disease) ([place])   . Depression   . DVT (deep venous thrombosis) ([place])  11/16/2016  . GERD (gastroesophageal reflux disease)   . Headache    "weekly for the last 3 months" (04/17/2017)  . Hyperlipidemia   . Hypertension   . Morbid obesity ([place])   . PE (pulmonary embolism)   . Pulmonary embolism ([place]) 09/21/2014   Review of Systems: Performed and all others negative.  Physical Exam:  Vitals:   06/04/17 1336  BP: 130/84  Pulse: 91  Temp: 98.3 F (36.8 C)  TempSrc: Oral  SpO2: 93%  Weight: (!) 312 lb 3.2 oz (141.6 kg)  Height: 6\' 2"  (1.88 m)   Physical Exam  Constitutional: She appears well-developed and well-nourished. No distress.  Obese Female  Cardiovascular: Normal rate, regular rhythm, normal heart sounds and intact distal pulses.  Pulmonary/Chest: Effort normal. No respiratory distress.  Trace end expiratory wheezing  Abdominal: Soft. Bowel sounds are normal. She exhibits no distension. There is no tenderness.  Musculoskeletal: She exhibits edema.  Neurological: She is alert.  Skin: No erythema.  Venous stasis changes bilaterally 1-2+ Edema bilat LLE Desquamation of former bullae    Assessment & Plan:   See Encounters Tab for problem based charting.  Patient discussed with Dr. Eppie Gibson

## 2017-06-04 NOTE — Progress Notes (Signed)
Patient ID: Sharon Vasquez, female   DOB: [birth year], 67 y.o.   [name]: [id number]  Case discussed with Dr. Trilby Drummer at the time of the visit.  We reviewed the resident's history and exam and pertinent patient test results.  I agree with the assessment, diagnosis and plan of care documented in the resident's note.

## 2017-06-04 NOTE — Telephone Encounter (Signed)
Pt seen 06/04/17 for HFU, chart closed for administrative purposes.Sharon Hidden Cassady3/4/20193:55 PM

## 2017-06-04 NOTE — Assessment & Plan Note (Addendum)
Patients venous stasis remains stable. She would likely benefit from compressive dressing such as an unna boot in the future once she has been appropriately diuresed while continuing to take her torsemide daily and after her LLE wound has healed. Regarding her pain today, she states that lately the combination of gabapentin and tylenol has been working relatively well for her to make her pain manageable and when needed (usaually before sleep) she will add her daily percocet to the combination. - Continue pain regimen of Percocet 5 BID PRN, Gabapentin, and Tylenol - Monitor wound for healing and for stable edema with continued diuresis - Consider [employer] or similar when healing and diuresis optimized

## 2017-06-05 ENCOUNTER — Telehealth: Payer: Self-pay | Admitting: Internal Medicine

## 2017-06-05 ENCOUNTER — Telehealth: Payer: Self-pay | Admitting: *Deleted

## 2017-06-05 DIAGNOSIS — I872 Venous insufficiency (chronic) (peripheral): Secondary | ICD-10-CM | POA: Diagnosis not present

## 2017-06-05 DIAGNOSIS — I110 Hypertensive heart disease with heart failure: Secondary | ICD-10-CM | POA: Diagnosis not present

## 2017-06-05 DIAGNOSIS — G8929 Other chronic pain: Secondary | ICD-10-CM | POA: Diagnosis not present

## 2017-06-05 DIAGNOSIS — I480 Paroxysmal atrial fibrillation: Secondary | ICD-10-CM | POA: Diagnosis not present

## 2017-06-05 LAB — BMP8+ANION GAP
Anion Gap: 18 mmol/L (ref 10.0–18.0)
BUN/Creatinine Ratio: 18 (ref 12–28)
BUN: 35 mg/dL — ABNORMAL HIGH (ref 8–27)
CO2: 24 mmol/L (ref 20–29)
Calcium: 9.5 mg/dL (ref 8.7–10.3)
Chloride: 99 mmol/L (ref 96–106)
Creatinine, Ser: 1.92 mg/dL — ABNORMAL HIGH (ref 0.57–1.00)
GFR calc Af Amer: 31 mL/min/{1.73_m2} — ABNORMAL LOW (ref >59)
GFR calc non Af Amer: 27 mL/min/{1.73_m2} — ABNORMAL LOW (ref >59)
Glucose: 118 mg/dL — ABNORMAL HIGH (ref 65–99)
Potassium: 3.6 mmol/L (ref 3.5–5.2)
Sodium: 141 mmol/L (ref 134–144)

## 2017-06-05 NOTE — Telephone Encounter (Signed)
Patient called and informed Ms Echevarria of her abnormal lab results with elevated BUN of 35 and Cr of 1.92 (up from 0.97 at discharge) indicating AKI in the setting of daily diuretic use (she had not previously been using this medication daily). Patient instructed to stop her Torsemide at this time and set an appointment in [facility] for Monday or Tuesday of next week to evaluate for resolution of her AKI. At that time, if renal function is improved, we will consider restarting Torsemide at 10mg  Daily with follow up to evaluate the effect of lowered dose.  Pearson Grippe, DO IM PGY-1

## 2017-06-05 NOTE — Telephone Encounter (Signed)
agree

## 2017-06-05 NOTE — Telephone Encounter (Signed)
HHN calls while at the home, she is there for wound care and states she would like to apply SS cream, coban and an UNA boot, this is for the L leg do you agree? [phone]

## 2017-06-06 NOTE — Telephone Encounter (Signed)
Spoke with Benjamine Mola, RN with [facility] [phone]) notified wound care order acceptable to PCP. Elizabeth requesting Rx for Silver Sulfadiazine 1% topical apply to affected area twice weekly be sent to patient's pharmacy ([facility] on [address]). Hubbard Hartshorn, RN, BSN

## 2017-06-09 DIAGNOSIS — I480 Paroxysmal atrial fibrillation: Secondary | ICD-10-CM | POA: Diagnosis not present

## 2017-06-09 DIAGNOSIS — I872 Venous insufficiency (chronic) (peripheral): Secondary | ICD-10-CM | POA: Diagnosis not present

## 2017-06-09 DIAGNOSIS — I110 Hypertensive heart disease with heart failure: Secondary | ICD-10-CM | POA: Diagnosis not present

## 2017-06-09 DIAGNOSIS — G8929 Other chronic pain: Secondary | ICD-10-CM | POA: Diagnosis not present

## 2017-06-11 ENCOUNTER — Ambulatory Visit (INDEPENDENT_AMBULATORY_CARE_PROVIDER_SITE_OTHER): Payer: Medicare Other | Admitting: Internal Medicine

## 2017-06-11 VITALS — BP 155/92 | HR 58 | Temp 97.7°F | Ht 74.0 in | Wt 321.8 lb

## 2017-06-11 DIAGNOSIS — M25562 Pain in left knee: Secondary | ICD-10-CM | POA: Diagnosis not present

## 2017-06-11 DIAGNOSIS — I5030 Unspecified diastolic (congestive) heart failure: Secondary | ICD-10-CM | POA: Diagnosis not present

## 2017-06-11 DIAGNOSIS — M170 Bilateral primary osteoarthritis of knee: Secondary | ICD-10-CM

## 2017-06-11 DIAGNOSIS — Z79899 Other long term (current) drug therapy: Secondary | ICD-10-CM | POA: Diagnosis not present

## 2017-06-11 DIAGNOSIS — M1990 Unspecified osteoarthritis, unspecified site: Secondary | ICD-10-CM | POA: Insufficient documentation

## 2017-06-11 NOTE — Assessment & Plan Note (Addendum)
Patient reporting worsened left knee pain recent. This is likely secondary to her osteoarthritis. The pain is absent at rest and worse with standing or walking. No effusion appreciated on exam. She is requesting a brace for her knee to help with stabilization. She is not requesting any additional pain medication at this time. We will provide patient with a brace. She has been advised not to continue to wear the brace if it is or becomes too tight and cuts of circulation. - Orthopedic over the knee elastic brace xlg, remove if uncomfortable/ too tight fit - Evaluate fit at follow up

## 2017-06-11 NOTE — Assessment & Plan Note (Signed)
>>  ASSESSMENT AND PLAN FOR DIASTOLIC HEART FAILURE (HCC) WRITTEN ON 06/13/2017 10:26 AM BY MELVIN, Lyn Hollingshead, MD  Patient returns today for evaluation of renal function and adjustment of diuretic dose. Previously on Torsemide 20mg  Daily, which was breifly held due to AKI. Patient denies shortness of breath or noticeable change to her LE edema. On exam patient has persistent LE edema and lungs are clear. She has gained 9 lbs since last visit per our scales. We will start her on Torsemide 10mg  Daily and obtain repeat bmp. - Torsemide 10mg  Daily - BMP today - Follow up in 2 weeks for repeat BMP and dose increase back to 20mg  daily if patient can tolerate.  ADDENDUM: BMP showed resolution of AKI. Patient to remain on torsemide 10mg  daily until follow up in 2 weeks. She is to be have repeat BMP a that time with up-tritration of torsemide to 20mg  Daily.

## 2017-06-11 NOTE — Progress Notes (Signed)
   CC: Heart Failure, Left knee pain  HPI:  [name] is a 67 y.o. F with PMHx listed below presenting for Heart Failure, Left knee pain. Please see the A&P for the status of the patient's chronic medical problems.   Past Medical History:  Diagnosis Date  . A-fib ([place])   . Anxiety   . Arthritis    "qwhre; joints, back" (04/17/2017)  . Benign breast cyst in female, left 01/08/2017   Found by Screening mammogram, evaluated by U/S on 01/08/17 and determined to be a benign simple breast cyst.  . Cellulitis of left lower leg 05/30/2017  . CHF (congestive heart failure) ([place])   . Chronic lower back pain   . Chronic venous insufficiency    [name] 05/30/2017  . COPD (chronic obstructive pulmonary disease) ([place])   . Depression   . DVT (deep venous thrombosis) ([place]) 11/16/2016  . GERD (gastroesophageal reflux disease)   . Headache    "weekly for the last 3 months" (04/17/2017)  . Hyperlipidemia   . Hypertension   . Morbid obesity ([place])   . PE (pulmonary embolism)   . Pulmonary embolism ([place]) 09/21/2014   Review of Systems: Performed and all others negative.  Physical Exam:  Vitals:   06/11/17 0954  BP: (!) 175/99  Pulse: 62  Temp: 97.7 F (36.5 C)  TempSrc: Oral  SpO2: 95%  Weight: (!) 321 lb 12.8 oz (146 kg)  Height: 6\' 2"  (1.88 m)   Physical Exam  Constitutional: She is oriented to person, place, and time. She appears well-developed and well-nourished.  Cardiovascular: Normal rate, regular rhythm, normal heart sounds and intact distal pulses.  Pulmonary/Chest: Effort normal and breath sounds normal. No respiratory distress.  Abdominal: Soft. Bowel sounds are normal. She exhibits no distension. There is no tenderness.  Musculoskeletal: She exhibits edema and tenderness. She exhibits no deformity.  Tenderness to palpation of medial left knee Persistent bilateral LE edema  Neurological: She is alert and oriented to person, place, and time.     Assessment & Plan:    See Encounters Tab for problem based charting.  Patient discussed with Dr. Rebeca Alert

## 2017-06-11 NOTE — Patient Instructions (Signed)
Thank you for allowing Sharon Vasquez to care for you  For your heart failure: - We are adjusting the dose of your diuretic; please take 10mg  of torsemide daily - We are obtaining labs today to ensure improvement of your renal function - Please return in 2 weeks for follow up; we will likely increase your dose of torsemide back to 20mg  at that time  For your left knee pain: - We will provide you with a knee brace for stability; If this does not fit properly, do not wear it and let Sharon Vasquez know - Continue with current pain regimen, you can take ibuprofen intermittently, but not too often as this can affect your kidneys  Please follow up in about 2 weeks

## 2017-06-11 NOTE — Assessment & Plan Note (Addendum)
Patient returns today for evaluation of renal function and adjustment of diuretic dose. Previously on Torsemide 20mg  Daily, which was breifly held due to AKI. Patient denies shortness of breath or noticeable change to her LE edema. On exam patient has persistent LE edema and lungs are clear. She has gained 9 lbs since last visit per our scales. We will start her on Torsemide 10mg  Daily and obtain repeat bmp. - Torsemide 10mg  Daily - BMP today - Follow up in 2 weeks for repeat BMP and dose increase back to 20mg  daily if patient can tolerate.  ADDENDUM: BMP showed resolution of AKI. Patient to remain on torsemide 10mg  daily until follow up in 2 weeks. She is to be have repeat BMP a that time with up-tritration of torsemide to 20mg  Daily.

## 2017-06-12 DIAGNOSIS — G8929 Other chronic pain: Secondary | ICD-10-CM | POA: Diagnosis not present

## 2017-06-12 DIAGNOSIS — I872 Venous insufficiency (chronic) (peripheral): Secondary | ICD-10-CM | POA: Diagnosis not present

## 2017-06-12 DIAGNOSIS — I480 Paroxysmal atrial fibrillation: Secondary | ICD-10-CM | POA: Diagnosis not present

## 2017-06-12 DIAGNOSIS — I110 Hypertensive heart disease with heart failure: Secondary | ICD-10-CM | POA: Diagnosis not present

## 2017-06-12 LAB — BMP8+ANION GAP
Anion Gap: 17 mmol/L (ref 10.0–18.0)
BUN/Creatinine Ratio: 20 (ref 12–28)
BUN: 19 mg/dL (ref 8–27)
CO2: 18 mmol/L — ABNORMAL LOW (ref 20–29)
Calcium: 9.1 mg/dL (ref 8.7–10.3)
Chloride: 107 mmol/L — ABNORMAL HIGH (ref 96–106)
Creatinine, Ser: 0.95 mg/dL (ref 0.57–1.00)
GFR calc Af Amer: 72 mL/min/{1.73_m2} (ref >59)
GFR calc non Af Amer: 62 mL/min/{1.73_m2} (ref >59)
Glucose: 90 mg/dL (ref 65–99)
Potassium: 4.3 mmol/L (ref 3.5–5.2)
Sodium: 142 mmol/L (ref 134–144)

## 2017-06-12 NOTE — Progress Notes (Signed)
Internal Medicine Clinic Attending  Case discussed with Dr. Melvin  at the time of the visit.  We reviewed the resident's history and exam and pertinent patient test results.  I agree with the assessment, diagnosis, and plan of care documented in the resident's note.  Alexander N Raines, MD   

## 2017-06-13 ENCOUNTER — Encounter: Payer: Self-pay | Admitting: Internal Medicine

## 2017-06-13 NOTE — Progress Notes (Signed)
Patient mailed a letter with her normal lab results, which showed resolution of her acute kidney injury. She will be following up in about 2 weeks for repeat labs and torsemide dose adjustment.  Pearson Grippe, DO IM PGY-1

## 2017-06-15 DIAGNOSIS — G8929 Other chronic pain: Secondary | ICD-10-CM | POA: Diagnosis not present

## 2017-06-15 DIAGNOSIS — I480 Paroxysmal atrial fibrillation: Secondary | ICD-10-CM | POA: Diagnosis not present

## 2017-06-15 DIAGNOSIS — I872 Venous insufficiency (chronic) (peripheral): Secondary | ICD-10-CM | POA: Diagnosis not present

## 2017-06-15 DIAGNOSIS — I110 Hypertensive heart disease with heart failure: Secondary | ICD-10-CM | POA: Diagnosis not present

## 2017-06-19 DIAGNOSIS — I872 Venous insufficiency (chronic) (peripheral): Secondary | ICD-10-CM | POA: Diagnosis not present

## 2017-06-19 DIAGNOSIS — I480 Paroxysmal atrial fibrillation: Secondary | ICD-10-CM | POA: Diagnosis not present

## 2017-06-19 DIAGNOSIS — I110 Hypertensive heart disease with heart failure: Secondary | ICD-10-CM | POA: Diagnosis not present

## 2017-06-19 DIAGNOSIS — G8929 Other chronic pain: Secondary | ICD-10-CM | POA: Diagnosis not present

## 2017-06-21 ENCOUNTER — Telehealth: Payer: Self-pay | Admitting: Internal Medicine

## 2017-06-21 DIAGNOSIS — I480 Paroxysmal atrial fibrillation: Secondary | ICD-10-CM | POA: Diagnosis not present

## 2017-06-21 DIAGNOSIS — I110 Hypertensive heart disease with heart failure: Secondary | ICD-10-CM | POA: Diagnosis not present

## 2017-06-21 DIAGNOSIS — G8929 Other chronic pain: Secondary | ICD-10-CM | POA: Diagnosis not present

## 2017-06-21 DIAGNOSIS — I872 Venous insufficiency (chronic) (peripheral): Secondary | ICD-10-CM | POA: Diagnosis not present

## 2017-06-21 NOTE — Telephone Encounter (Signed)
Thank you :)

## 2017-06-21 NOTE — Telephone Encounter (Signed)
rtc to [place], lm for rtc

## 2017-06-21 NOTE — Telephone Encounter (Signed)
Patient blood pressure was 170/90; has not taken medicine. She also called an hr after she left to remind patient to take medicine and she still hasn't taken it

## 2017-06-21 NOTE — Telephone Encounter (Signed)
lavelle rtc, she would like the office to reinforce pt taking meds properly, assured her that this is done at each visit and triage will call pt today to reinforce, called pt, discussed meds and taking at same time each day, that this will keep her balance and a steady dose of med in her system and hopefully keep her BP at a more desired level, she is agreeable and thanks triage

## 2017-06-22 DIAGNOSIS — R6889 Other general symptoms and signs: Secondary | ICD-10-CM | POA: Diagnosis not present

## 2017-06-22 DIAGNOSIS — M545 Low back pain: Secondary | ICD-10-CM | POA: Diagnosis not present

## 2017-06-22 DIAGNOSIS — M25559 Pain in unspecified hip: Secondary | ICD-10-CM | POA: Diagnosis not present

## 2017-06-22 DIAGNOSIS — M25562 Pain in left knee: Secondary | ICD-10-CM | POA: Diagnosis not present

## 2017-06-22 DIAGNOSIS — M25561 Pain in right knee: Secondary | ICD-10-CM | POA: Diagnosis not present

## 2017-06-22 DIAGNOSIS — G894 Chronic pain syndrome: Secondary | ICD-10-CM | POA: Diagnosis not present

## 2017-06-25 ENCOUNTER — Ambulatory Visit: Payer: Medicare Other

## 2017-06-26 DIAGNOSIS — I872 Venous insufficiency (chronic) (peripheral): Secondary | ICD-10-CM | POA: Diagnosis not present

## 2017-06-26 DIAGNOSIS — G8929 Other chronic pain: Secondary | ICD-10-CM | POA: Diagnosis not present

## 2017-06-26 DIAGNOSIS — I480 Paroxysmal atrial fibrillation: Secondary | ICD-10-CM | POA: Diagnosis not present

## 2017-06-26 DIAGNOSIS — I110 Hypertensive heart disease with heart failure: Secondary | ICD-10-CM | POA: Diagnosis not present

## 2017-06-29 DIAGNOSIS — I110 Hypertensive heart disease with heart failure: Secondary | ICD-10-CM | POA: Diagnosis not present

## 2017-06-29 DIAGNOSIS — G8929 Other chronic pain: Secondary | ICD-10-CM | POA: Diagnosis not present

## 2017-06-29 DIAGNOSIS — I480 Paroxysmal atrial fibrillation: Secondary | ICD-10-CM | POA: Diagnosis not present

## 2017-06-29 DIAGNOSIS — I872 Venous insufficiency (chronic) (peripheral): Secondary | ICD-10-CM | POA: Diagnosis not present

## 2017-07-02 ENCOUNTER — Other Ambulatory Visit: Payer: Self-pay

## 2017-07-02 ENCOUNTER — Ambulatory Visit (INDEPENDENT_AMBULATORY_CARE_PROVIDER_SITE_OTHER): Payer: Medicare Other | Admitting: Internal Medicine

## 2017-07-02 VITALS — BP 173/96 | HR 68 | Temp 98.2°F | Ht 74.0 in | Wt 325.3 lb

## 2017-07-02 DIAGNOSIS — I872 Venous insufficiency (chronic) (peripheral): Secondary | ICD-10-CM

## 2017-07-02 DIAGNOSIS — Z79899 Other long term (current) drug therapy: Secondary | ICD-10-CM | POA: Diagnosis not present

## 2017-07-02 DIAGNOSIS — I5030 Unspecified diastolic (congestive) heart failure: Secondary | ICD-10-CM | POA: Diagnosis not present

## 2017-07-02 DIAGNOSIS — I480 Paroxysmal atrial fibrillation: Secondary | ICD-10-CM | POA: Diagnosis not present

## 2017-07-02 DIAGNOSIS — M7989 Other specified soft tissue disorders: Secondary | ICD-10-CM | POA: Diagnosis not present

## 2017-07-02 DIAGNOSIS — G8929 Other chronic pain: Secondary | ICD-10-CM | POA: Diagnosis not present

## 2017-07-02 DIAGNOSIS — Z9114 Patient's other noncompliance with medication regimen: Secondary | ICD-10-CM | POA: Diagnosis not present

## 2017-07-02 DIAGNOSIS — I10 Essential (primary) hypertension: Secondary | ICD-10-CM

## 2017-07-02 DIAGNOSIS — I110 Hypertensive heart disease with heart failure: Secondary | ICD-10-CM | POA: Diagnosis not present

## 2017-07-02 NOTE — Progress Notes (Signed)
   CC: follow-up of LE swelling.  HPI:  Sharon Vasquez is a 67 y.o. F with extensive medical history who presents today for follow-up of her chronic LE edema. Please see problem-based charting for further details regarding her assessment and plan.  Past Medical History:  Diagnosis Date  . A-fib ([place])   . Anxiety   . Arthritis    "qwhre; joints, back" (04/17/2017)  . Benign breast cyst in female, left 01/08/2017   Found by Screening mammogram, evaluated by U/S on 01/08/17 and determined to be a benign simple breast cyst.  . Cellulitis of left lower leg 05/30/2017  . CHF (congestive heart failure) ([place])   . Chronic lower back pain   . Chronic venous insufficiency    [name] 05/30/2017  . COPD (chronic obstructive pulmonary disease) ([place])   . Depression   . DVT (deep venous thrombosis) ([place]) 11/16/2016  . GERD (gastroesophageal reflux disease)   . Headache    "weekly for the last 3 months" (04/17/2017)  . Hyperlipidemia   . Hypertension   . Morbid obesity ([place])   . PE (pulmonary embolism)   . Pulmonary embolism ([place]) 09/21/2014   Review of Systems:   General: Denies fevers, chills HEENT: Denies sore throat, worsened vision Cardiac: Denies CP, worsened SOB Pulmonary: Denies productive cough Abd: Denies abdominal pain, changes in bowels  Physical Exam: General: Alert, in no acute distress. Quite conversant. Ambulates with walker. HEENT: No icterus, injection or ptosis. No hoarseness or dysarthria. No JVD.  Cardiac: RRR, no MGR appreciated Pulmonary: CTA BL with normal WOB on RA. Able to speak in complete sentences Abd: Soft, non-tender. +bs Extremities: Warm, perfused. 1-2+ pitting edema BL LE. Areas of prior ulcerations healing well, no open wounds.  Vitals:   07/02/17 1434  BP: (!) 173/96  Pulse: 68  Temp: 98.2 F (36.8 C)  TempSrc: Oral  SpO2: 99%  Weight: (!) 325 lb 4.8 oz (147.6 kg)  Height: 6\' 2"  (1.88 m)   Assessment & Plan:   See Encounters Tab for problem  based charting.  Patient discussed with Dr. Angelia Mould

## 2017-07-02 NOTE — Patient Instructions (Signed)
It was nice seeing you today. Today we talked about your leg swelling.  Please keep taking Torsemide 20mg  daily. Please watch your fluid intake and also your sodium intake. Try to limit yourself to less than 2L of fluid a day.   It is very important that you elevate your legs and wear your compression stockings in order to help prevent fluid build up.   I am going to check your renal function today just to make sure your kidneys are tolerating the torsemide.

## 2017-07-02 NOTE — Assessment & Plan Note (Signed)
Ms. Tapp reports compliance with Torsemide daily since her last clinic visit. She has noticed worsening LE swelling since stopping wearing her compression hose due to pain. She also has not been elevating her feet consistently at night.  Chart review shows ECHO from 2018 with preserved EF and while leg swelling has been attributed to HFpEF recently, she does have other reasons for persistent LE edema including venous stasis. She also seems to have some evidence for lipedema on exam as well. She does have weight gain since her last clinic appointment, but patient denied any worsening SOB, lungs are clear and I did not appreciate JVD on examination. She does have peripheral edema, but I'm not inclined to aggressively diurese her today given her lack of symptoms. I did encourage her to resume compression stockings and elevating her LEs, as well as continue Torsemide at its current dose of 20mg  daily.  -Check renal function -Continue Torsemide 20mg  -Pt to resume compression stockings and elevation -Discussed fluid balance and symptoms she should return to clinic for closer follow-up including SOB, orthopnea or PND.

## 2017-07-02 NOTE — Assessment & Plan Note (Signed)
BP again elevated today but denied any HA or worsened vision. She states she has not taken her blood pressure medication in several days. She did not want to discuss importance of taking this medication daily. She has a follow-up appointment with her PCP in the next 2 weeks, I encouraged her compliance so we can assess her BP control at that time.

## 2017-07-03 ENCOUNTER — Telehealth: Payer: Self-pay

## 2017-07-03 DIAGNOSIS — I110 Hypertensive heart disease with heart failure: Secondary | ICD-10-CM | POA: Diagnosis not present

## 2017-07-03 DIAGNOSIS — I480 Paroxysmal atrial fibrillation: Secondary | ICD-10-CM | POA: Diagnosis not present

## 2017-07-03 DIAGNOSIS — G8929 Other chronic pain: Secondary | ICD-10-CM | POA: Diagnosis not present

## 2017-07-03 DIAGNOSIS — I872 Venous insufficiency (chronic) (peripheral): Secondary | ICD-10-CM | POA: Diagnosis not present

## 2017-07-03 LAB — BMP8+ANION GAP
Anion Gap: 18 mmol/L (ref 10.0–18.0)
BUN/Creatinine Ratio: 18 (ref 12–28)
BUN: 20 mg/dL (ref 8–27)
CO2: 19 mmol/L — ABNORMAL LOW (ref 20–29)
Calcium: 9.3 mg/dL (ref 8.7–10.3)
Chloride: 105 mmol/L (ref 96–106)
Creatinine, Ser: 1.09 mg/dL — ABNORMAL HIGH (ref 0.57–1.00)
GFR calc Af Amer: 61 mL/min/{1.73_m2} (ref >59)
GFR calc non Af Amer: 53 mL/min/{1.73_m2} — ABNORMAL LOW (ref >59)
Glucose: 87 mg/dL (ref 65–99)
Potassium: 4.4 mmol/L (ref 3.5–5.2)
Sodium: 142 mmol/L (ref 134–144)

## 2017-07-03 NOTE — Telephone Encounter (Signed)
Agree with VO

## 2017-07-03 NOTE — Telephone Encounter (Signed)
HHN calls and states that pt tells her she is having balance issues and is getting afraid to ambulate, she is asking for [facility] PT to eval and treat, VO given, do you agree?

## 2017-07-03 NOTE — Telephone Encounter (Signed)
Sharon Vasquez with [facility] requesting VO. Please call back.

## 2017-07-05 NOTE — Progress Notes (Signed)
Internal Medicine Clinic Attending  Case discussed with Dr. Danford Bad at the time of the visit.  We reviewed the resident's history and exam and pertinent patient test results.  I agree with the assessment, diagnosis, and plan of care documented in the resident's note. Her Edema of legs is predominately due to Venous insufficiency with poor adherence to recommended therapy, she will need to continue compression stocking and leg elevation.  Would not aggressively try to "diurese" her as I do not think HFpEF is playing much of a role and she will again become volume deplented.

## 2017-07-10 DIAGNOSIS — I872 Venous insufficiency (chronic) (peripheral): Secondary | ICD-10-CM | POA: Diagnosis not present

## 2017-07-10 DIAGNOSIS — I480 Paroxysmal atrial fibrillation: Secondary | ICD-10-CM | POA: Diagnosis not present

## 2017-07-10 DIAGNOSIS — G8929 Other chronic pain: Secondary | ICD-10-CM | POA: Diagnosis not present

## 2017-07-10 DIAGNOSIS — I110 Hypertensive heart disease with heart failure: Secondary | ICD-10-CM | POA: Diagnosis not present

## 2017-07-13 DIAGNOSIS — I872 Venous insufficiency (chronic) (peripheral): Secondary | ICD-10-CM | POA: Diagnosis not present

## 2017-07-13 DIAGNOSIS — I480 Paroxysmal atrial fibrillation: Secondary | ICD-10-CM | POA: Diagnosis not present

## 2017-07-13 DIAGNOSIS — I110 Hypertensive heart disease with heart failure: Secondary | ICD-10-CM | POA: Diagnosis not present

## 2017-07-13 DIAGNOSIS — G8929 Other chronic pain: Secondary | ICD-10-CM | POA: Diagnosis not present

## 2017-07-17 DIAGNOSIS — I480 Paroxysmal atrial fibrillation: Secondary | ICD-10-CM | POA: Diagnosis not present

## 2017-07-17 DIAGNOSIS — I110 Hypertensive heart disease with heart failure: Secondary | ICD-10-CM | POA: Diagnosis not present

## 2017-07-17 DIAGNOSIS — I872 Venous insufficiency (chronic) (peripheral): Secondary | ICD-10-CM | POA: Diagnosis not present

## 2017-07-17 DIAGNOSIS — G8929 Other chronic pain: Secondary | ICD-10-CM | POA: Diagnosis not present

## 2017-07-19 ENCOUNTER — Encounter: Payer: Self-pay | Admitting: Internal Medicine

## 2017-07-19 ENCOUNTER — Ambulatory Visit (INDEPENDENT_AMBULATORY_CARE_PROVIDER_SITE_OTHER): Payer: Medicare Other | Admitting: Internal Medicine

## 2017-07-19 ENCOUNTER — Other Ambulatory Visit: Payer: Self-pay

## 2017-07-19 VITALS — BP 165/90 | HR 73 | Temp 98.4°F | Ht 74.0 in | Wt 338.6 lb

## 2017-07-19 DIAGNOSIS — I5032 Chronic diastolic (congestive) heart failure: Secondary | ICD-10-CM

## 2017-07-19 DIAGNOSIS — I739 Peripheral vascular disease, unspecified: Secondary | ICD-10-CM

## 2017-07-19 DIAGNOSIS — M170 Bilateral primary osteoarthritis of knee: Secondary | ICD-10-CM

## 2017-07-19 DIAGNOSIS — R739 Hyperglycemia, unspecified: Secondary | ICD-10-CM

## 2017-07-19 DIAGNOSIS — I872 Venous insufficiency (chronic) (peripheral): Secondary | ICD-10-CM | POA: Diagnosis not present

## 2017-07-19 DIAGNOSIS — Z79891 Long term (current) use of opiate analgesic: Secondary | ICD-10-CM

## 2017-07-19 DIAGNOSIS — Z1212 Encounter for screening for malignant neoplasm of rectum: Secondary | ICD-10-CM

## 2017-07-19 DIAGNOSIS — R7301 Impaired fasting glucose: Secondary | ICD-10-CM | POA: Diagnosis not present

## 2017-07-19 DIAGNOSIS — R6889 Other general symptoms and signs: Secondary | ICD-10-CM | POA: Diagnosis not present

## 2017-07-19 DIAGNOSIS — I110 Hypertensive heart disease with heart failure: Secondary | ICD-10-CM

## 2017-07-19 DIAGNOSIS — M818 Other osteoporosis without current pathological fracture: Secondary | ICD-10-CM | POA: Diagnosis not present

## 2017-07-19 DIAGNOSIS — Z6841 Body Mass Index (BMI) 40.0 and over, adult: Secondary | ICD-10-CM | POA: Diagnosis not present

## 2017-07-19 DIAGNOSIS — Z1211 Encounter for screening for malignant neoplasm of colon: Secondary | ICD-10-CM

## 2017-07-19 DIAGNOSIS — M810 Age-related osteoporosis without current pathological fracture: Secondary | ICD-10-CM | POA: Diagnosis not present

## 2017-07-19 DIAGNOSIS — I480 Paroxysmal atrial fibrillation: Secondary | ICD-10-CM | POA: Diagnosis not present

## 2017-07-19 DIAGNOSIS — I700 Atherosclerosis of aorta: Secondary | ICD-10-CM | POA: Diagnosis not present

## 2017-07-19 DIAGNOSIS — I5030 Unspecified diastolic (congestive) heart failure: Secondary | ICD-10-CM

## 2017-07-19 DIAGNOSIS — I10 Essential (primary) hypertension: Secondary | ICD-10-CM

## 2017-07-19 DIAGNOSIS — Z7901 Long term (current) use of anticoagulants: Secondary | ICD-10-CM

## 2017-07-19 DIAGNOSIS — Z79899 Other long term (current) drug therapy: Secondary | ICD-10-CM

## 2017-07-19 MED ORDER — GABAPENTIN 400 MG PO CAPS: 400.0000 mg | ORAL_CAPSULE | Freq: Two times a day (BID) | ORAL | 2 refills | Status: DC

## 2017-07-19 MED ORDER — TORSEMIDE 20 MG PO TABS: 40.0000 mg | ORAL_TABLET | Freq: Every day | ORAL | 2 refills | Status: DC

## 2017-07-19 MED ORDER — ALENDRONATE SODIUM 70 MG PO TABS: 70.0000 mg | ORAL_TABLET | ORAL | 3 refills | Status: AC

## 2017-07-19 NOTE — Assessment & Plan Note (Signed)
Check A1c. 

## 2017-07-19 NOTE — Assessment & Plan Note (Signed)
HPI: C/o knee pain along with leg pain, notes pain and stiffness on standing, limits mobility.   Was previously seeing Orthopedic surgery in [place] last saw 07/21/15, note reviewed: severe bilateral tricompartmental OA, not a good surgical candiate with risk outweight benefits.    A: Severe bilateral OA of knees  P: Discussed importance of weight loss, she notes that she is about to start PT, I think this would be a great idea. I discussed her previous ortho visit with her, she was able to remember that she was not a surgical candidate, I offered to her referral if desired but she declines.

## 2017-07-19 NOTE — Assessment & Plan Note (Signed)
HPI: I had obtained a DEXA scan a few months ago, however visits in between it other priorities always surfaced.  I did take some time to discuss this today.    A: Osteoporosis  P:Check Vit D Discussed Osteoporosis disease and management, she is willing to start Fosamax, will start 70mg  once weekly, I reviewed how to take the medication.

## 2017-07-19 NOTE — Assessment & Plan Note (Signed)
HPI: Weight is up, increased LE edema, no signifcant othopnea, DOE.   Has had a couple of recent dose changes of diuretics>> AKI>> then backed off  A: Chronic HFpEF  P: Increase torsemide back to her previous dose of 40mg  once a day.

## 2017-07-19 NOTE — Assessment & Plan Note (Signed)
Discussed importance of weight loss. 

## 2017-07-19 NOTE — Assessment & Plan Note (Signed)
HPI: no complaints.  A: PAF  P: Continue Amiodarone and Xarelto.  Due for 1 year follow up with cardiology , I reminded her of this.

## 2017-07-19 NOTE — Progress Notes (Signed)
Subjective:  HPI: Sharon Vasquez is a 67 y.o. female who presents for f/u of HTN, dCHF, venous insufficnecy  Please see Assessment and Plan below for the status of her chronic medical problems.  Review of Systems: Review of Systems  Constitutional: Negative for fever and malaise/fatigue.  HENT: Negative for hearing loss.   Eyes: Negative for blurred vision.  Respiratory: Negative for cough, shortness of breath and wheezing.   Cardiovascular: Positive for leg swelling. Negative for chest pain, palpitations, orthopnea and PND.  Gastrointestinal: Negative for abdominal pain and constipation.  Musculoskeletal: Positive for falls and joint pain.    Objective:  Physical Exam: Vitals:   07/19/17 0932  BP: (!) 165/90  Pulse: 73  Temp: 98.4 F (36.9 C)  TempSrc: Oral  SpO2: 93%  Weight: (!) 338 lb 9.6 oz (153.6 kg)  Height: 6\' 2"  (1.88 m)   Physical Exam  Constitutional: She appears well-developed and well-nourished.  Cardiovascular: An irregularly irregular rhythm present.  Pulmonary/Chest: Effort normal.  Musculoskeletal: She exhibits edema (2+ past knee bilaterally).  Nursing note and vitals reviewed.  Assessment & Plan:  (HFpEF) heart failure with preserved ejection fraction (HCC) HPI: Weight is up, increased LE edema, no signifcant othopnea, DOE.   Has had a couple of recent dose changes of diuretics>> AKI>> then backed off  A: Chronic HFpEF  P: Increase torsemide back to her previous dose of 40mg  once a day.  PAF (paroxysmal atrial fibrillation) (HCC) HPI: no complaints.  A: PAF  P: Continue Amiodarone and Xarelto.  Due for 1 year follow up with cardiology , I reminded her of this.  Chronic venous insufficiency HPI: She peservates on her LE today,  She is seeing pain management and feels oxycodone helps when she takes the medication.  She has not been doing leg elevation.  Notes that she previously had UNNA boots on but "had to be cut off." Note that the  compression stockings ordered by Vascular "cut off my blood supply," and thus is not using.  A: Chronic venous insufficency of bilateral legs  P: Reinforced importance of leg elevation and compression.    Aortic atherosclerosis (HCC) Continue atorvastatin 40mg  daily  Primary osteoarthritis of both knees HPI: C/o knee pain along with leg pain, notes pain and stiffness on standing, limits mobility.   Was previously seeing Orthopedic surgery in [place] last saw 07/21/15, note reviewed: severe bilateral tricompartmental OA, not a good surgical candiate with risk outweight benefits.    A: Severe bilateral OA of knees  P: Discussed importance of weight loss, she notes that she is about to start PT, I think this would be a great idea. I discussed her previous ortho visit with her, she was able to remember that she was not a surgical candidate, I offered to her referral if desired but she declines.  Osteoporosis HPI: I had obtained a DEXA scan a few months ago, however visits in between it other priorities always surfaced.  I did take some time to discuss this today.    A: Osteoporosis  P:Check Vit D Discussed Osteoporosis disease and management, she is willing to start Fosamax, will start 70mg  once weekly, I reviewed how to take the medication.  Morbid obesity ([place]) -Discussed importance of weight loss  Elevated blood sugar Check A1c   Medications Ordered Meds ordered this encounter  Medications  . torsemide (DEMADEX) 20 MG tablet    Sig: Take 2 tablets (40 mg total) by mouth daily.    Dispense:  60 tablet  Refill:  2  . alendronate (FOSAMAX) 70 MG tablet    Sig: Take 1 tablet (70 mg total) by mouth every 7 (seven) days. Take with a full glass of water on an empty stomach.    Dispense:  12 tablet    Refill:  3  . gabapentin (NEURONTIN) 400 MG capsule    Sig: Take 1 capsule (400 mg total) by mouth 2 (two) times daily.    Dispense:  60 capsule    Refill:  2   Other  Orders Orders Placed This Encounter  Procedures  . Fecal occult blood, imunochemical    Standing Status:   Future    Standing Expiration Date:   07/20/2018  . BMP8+Anion Gap  . Lipid Profile  . Hemoglobin A1c  . Vitamin D (25 hydroxy)   Follow Up: Return in about 3 months (around 10/18/2017).

## 2017-07-19 NOTE — Assessment & Plan Note (Signed)
>>  ASSESSMENT AND PLAN FOR DIASTOLIC HEART FAILURE (HCC) WRITTEN ON 07/19/2017  3:20 PM BY HOFFMAN, ERIK C, DO  HPI: Weight is up, increased LE edema, no signifcant othopnea, DOE.   Has had a couple of recent dose changes of diuretics>> AKI>> then backed off  A: Chronic HFpEF  P: Increase torsemide back to her previous dose of 40mg  once a day.

## 2017-07-19 NOTE — Assessment & Plan Note (Signed)
Continue atorvastatin 40 mg daily.  

## 2017-07-19 NOTE — Assessment & Plan Note (Signed)
HPI: She peservates on her LE today,  She is seeing pain management and feels oxycodone helps when she takes the medication.  She has not been doing leg elevation.  Notes that she previously had UNNA boots on but "had to be cut off." Note that the compression stockings ordered by Vascular "cut off my blood supply," and thus is not using.  A: Chronic venous insufficency of bilateral legs  P: Reinforced importance of leg elevation and compression.

## 2017-07-20 DIAGNOSIS — I872 Venous insufficiency (chronic) (peripheral): Secondary | ICD-10-CM | POA: Diagnosis not present

## 2017-07-20 DIAGNOSIS — G8929 Other chronic pain: Secondary | ICD-10-CM | POA: Diagnosis not present

## 2017-07-20 DIAGNOSIS — I480 Paroxysmal atrial fibrillation: Secondary | ICD-10-CM | POA: Diagnosis not present

## 2017-07-20 DIAGNOSIS — I110 Hypertensive heart disease with heart failure: Secondary | ICD-10-CM | POA: Diagnosis not present

## 2017-07-20 LAB — BMP8+ANION GAP
Anion Gap: 17 mmol/L (ref 10.0–18.0)
BUN/Creatinine Ratio: 21 (ref 12–28)
BUN: 19 mg/dL (ref 8–27)
CO2: 19 mmol/L — ABNORMAL LOW (ref 20–29)
Calcium: 9.6 mg/dL (ref 8.7–10.3)
Chloride: 105 mmol/L (ref 96–106)
Creatinine, Ser: 0.91 mg/dL (ref 0.57–1.00)
GFR calc Af Amer: 76 mL/min/{1.73_m2} (ref >59)
GFR calc non Af Amer: 65 mL/min/{1.73_m2} (ref >59)
Glucose: 98 mg/dL (ref 65–99)
Potassium: 4.3 mmol/L (ref 3.5–5.2)
Sodium: 141 mmol/L (ref 134–144)

## 2017-07-20 LAB — LIPID PANEL
Chol/HDL Ratio: 3.2 ratio (ref 0.0–4.4)
Cholesterol, Total: 154 mg/dL (ref 100–199)
HDL: 48 mg/dL (ref >39)
LDL Calculated: 86 mg/dL (ref 0–99)
Triglycerides: 99 mg/dL (ref 0–149)
VLDL Cholesterol Cal: 20 mg/dL (ref 5–40)

## 2017-07-20 LAB — HEMOGLOBIN A1C
Est. average glucose Bld gHb Est-mCnc: 123 mg/dL
Hgb A1c MFr Bld: 5.9 % — ABNORMAL HIGH (ref 4.8–5.6)

## 2017-07-20 LAB — VITAMIN D 25 HYDROXY (VIT D DEFICIENCY, FRACTURES): Vit D, 25-Hydroxy: 13.3 ng/mL — ABNORMAL LOW (ref 30.0–100.0)

## 2017-07-23 DIAGNOSIS — I480 Paroxysmal atrial fibrillation: Secondary | ICD-10-CM | POA: Diagnosis not present

## 2017-07-23 DIAGNOSIS — I872 Venous insufficiency (chronic) (peripheral): Secondary | ICD-10-CM | POA: Diagnosis not present

## 2017-07-23 DIAGNOSIS — Z1212 Encounter for screening for malignant neoplasm of rectum: Secondary | ICD-10-CM | POA: Diagnosis not present

## 2017-07-23 DIAGNOSIS — I110 Hypertensive heart disease with heart failure: Secondary | ICD-10-CM | POA: Diagnosis not present

## 2017-07-23 DIAGNOSIS — G8929 Other chronic pain: Secondary | ICD-10-CM | POA: Diagnosis not present

## 2017-07-23 DIAGNOSIS — Z1211 Encounter for screening for malignant neoplasm of colon: Secondary | ICD-10-CM | POA: Diagnosis not present

## 2017-07-24 DIAGNOSIS — R6889 Other general symptoms and signs: Secondary | ICD-10-CM | POA: Diagnosis not present

## 2017-07-24 DIAGNOSIS — M545 Low back pain: Secondary | ICD-10-CM | POA: Diagnosis not present

## 2017-07-24 DIAGNOSIS — M25562 Pain in left knee: Secondary | ICD-10-CM | POA: Diagnosis not present

## 2017-07-24 DIAGNOSIS — M25561 Pain in right knee: Secondary | ICD-10-CM | POA: Diagnosis not present

## 2017-07-24 DIAGNOSIS — M25559 Pain in unspecified hip: Secondary | ICD-10-CM | POA: Diagnosis not present

## 2017-07-24 DIAGNOSIS — G894 Chronic pain syndrome: Secondary | ICD-10-CM | POA: Diagnosis not present

## 2017-07-25 DIAGNOSIS — G8929 Other chronic pain: Secondary | ICD-10-CM | POA: Diagnosis not present

## 2017-07-25 DIAGNOSIS — I480 Paroxysmal atrial fibrillation: Secondary | ICD-10-CM | POA: Diagnosis not present

## 2017-07-25 DIAGNOSIS — I110 Hypertensive heart disease with heart failure: Secondary | ICD-10-CM | POA: Diagnosis not present

## 2017-07-25 DIAGNOSIS — I872 Venous insufficiency (chronic) (peripheral): Secondary | ICD-10-CM | POA: Diagnosis not present

## 2017-07-26 DIAGNOSIS — I872 Venous insufficiency (chronic) (peripheral): Secondary | ICD-10-CM | POA: Diagnosis not present

## 2017-07-26 DIAGNOSIS — I480 Paroxysmal atrial fibrillation: Secondary | ICD-10-CM | POA: Diagnosis not present

## 2017-07-26 DIAGNOSIS — I110 Hypertensive heart disease with heart failure: Secondary | ICD-10-CM | POA: Diagnosis not present

## 2017-07-26 DIAGNOSIS — G8929 Other chronic pain: Secondary | ICD-10-CM | POA: Diagnosis not present

## 2017-07-27 ENCOUNTER — Encounter: Payer: Self-pay | Admitting: Internal Medicine

## 2017-07-27 DIAGNOSIS — L03116 Cellulitis of left lower limb: Secondary | ICD-10-CM | POA: Diagnosis not present

## 2017-07-27 DIAGNOSIS — I872 Venous insufficiency (chronic) (peripheral): Secondary | ICD-10-CM | POA: Diagnosis not present

## 2017-07-27 DIAGNOSIS — I272 Pulmonary hypertension, unspecified: Secondary | ICD-10-CM | POA: Diagnosis not present

## 2017-07-27 DIAGNOSIS — M810 Age-related osteoporosis without current pathological fracture: Secondary | ICD-10-CM | POA: Diagnosis not present

## 2017-07-27 DIAGNOSIS — I5032 Chronic diastolic (congestive) heart failure: Secondary | ICD-10-CM | POA: Diagnosis not present

## 2017-07-27 DIAGNOSIS — G8929 Other chronic pain: Secondary | ICD-10-CM | POA: Diagnosis not present

## 2017-07-27 DIAGNOSIS — I739 Peripheral vascular disease, unspecified: Secondary | ICD-10-CM | POA: Diagnosis not present

## 2017-07-27 DIAGNOSIS — I110 Hypertensive heart disease with heart failure: Secondary | ICD-10-CM | POA: Diagnosis not present

## 2017-07-27 DIAGNOSIS — G894 Chronic pain syndrome: Secondary | ICD-10-CM | POA: Insufficient documentation

## 2017-07-27 DIAGNOSIS — J449 Chronic obstructive pulmonary disease, unspecified: Secondary | ICD-10-CM | POA: Diagnosis not present

## 2017-07-27 DIAGNOSIS — I480 Paroxysmal atrial fibrillation: Secondary | ICD-10-CM | POA: Diagnosis not present

## 2017-07-27 DIAGNOSIS — M170 Bilateral primary osteoarthritis of knee: Secondary | ICD-10-CM | POA: Diagnosis not present

## 2017-07-27 DIAGNOSIS — L97821 Non-pressure chronic ulcer of other part of left lower leg limited to breakdown of skin: Secondary | ICD-10-CM | POA: Diagnosis not present

## 2017-07-30 ENCOUNTER — Other Ambulatory Visit: Payer: Medicare Other

## 2017-07-30 DIAGNOSIS — L03116 Cellulitis of left lower limb: Secondary | ICD-10-CM | POA: Diagnosis not present

## 2017-07-30 DIAGNOSIS — I480 Paroxysmal atrial fibrillation: Secondary | ICD-10-CM | POA: Diagnosis not present

## 2017-07-30 DIAGNOSIS — Z1212 Encounter for screening for malignant neoplasm of rectum: Principal | ICD-10-CM

## 2017-07-30 DIAGNOSIS — I872 Venous insufficiency (chronic) (peripheral): Secondary | ICD-10-CM | POA: Diagnosis not present

## 2017-07-30 DIAGNOSIS — I272 Pulmonary hypertension, unspecified: Secondary | ICD-10-CM | POA: Diagnosis not present

## 2017-07-30 DIAGNOSIS — I110 Hypertensive heart disease with heart failure: Secondary | ICD-10-CM | POA: Diagnosis not present

## 2017-07-30 DIAGNOSIS — J449 Chronic obstructive pulmonary disease, unspecified: Secondary | ICD-10-CM | POA: Diagnosis not present

## 2017-07-30 DIAGNOSIS — M170 Bilateral primary osteoarthritis of knee: Secondary | ICD-10-CM | POA: Diagnosis not present

## 2017-07-30 DIAGNOSIS — I5032 Chronic diastolic (congestive) heart failure: Secondary | ICD-10-CM | POA: Diagnosis not present

## 2017-07-30 DIAGNOSIS — Z1211 Encounter for screening for malignant neoplasm of colon: Secondary | ICD-10-CM

## 2017-07-30 DIAGNOSIS — G8929 Other chronic pain: Secondary | ICD-10-CM | POA: Diagnosis not present

## 2017-07-30 DIAGNOSIS — L97821 Non-pressure chronic ulcer of other part of left lower leg limited to breakdown of skin: Secondary | ICD-10-CM | POA: Diagnosis not present

## 2017-07-30 DIAGNOSIS — I739 Peripheral vascular disease, unspecified: Secondary | ICD-10-CM | POA: Diagnosis not present

## 2017-07-30 DIAGNOSIS — M810 Age-related osteoporosis without current pathological fracture: Secondary | ICD-10-CM | POA: Diagnosis not present

## 2017-07-31 DIAGNOSIS — I110 Hypertensive heart disease with heart failure: Secondary | ICD-10-CM | POA: Diagnosis not present

## 2017-07-31 DIAGNOSIS — G8929 Other chronic pain: Secondary | ICD-10-CM | POA: Diagnosis not present

## 2017-07-31 DIAGNOSIS — I872 Venous insufficiency (chronic) (peripheral): Secondary | ICD-10-CM | POA: Diagnosis not present

## 2017-07-31 DIAGNOSIS — I480 Paroxysmal atrial fibrillation: Secondary | ICD-10-CM | POA: Diagnosis not present

## 2017-07-31 DIAGNOSIS — J449 Chronic obstructive pulmonary disease, unspecified: Secondary | ICD-10-CM | POA: Diagnosis not present

## 2017-07-31 DIAGNOSIS — M810 Age-related osteoporosis without current pathological fracture: Secondary | ICD-10-CM | POA: Diagnosis not present

## 2017-07-31 DIAGNOSIS — L03116 Cellulitis of left lower limb: Secondary | ICD-10-CM | POA: Diagnosis not present

## 2017-07-31 DIAGNOSIS — I5032 Chronic diastolic (congestive) heart failure: Secondary | ICD-10-CM | POA: Diagnosis not present

## 2017-07-31 DIAGNOSIS — M170 Bilateral primary osteoarthritis of knee: Secondary | ICD-10-CM | POA: Diagnosis not present

## 2017-07-31 DIAGNOSIS — I739 Peripheral vascular disease, unspecified: Secondary | ICD-10-CM | POA: Diagnosis not present

## 2017-07-31 DIAGNOSIS — L97821 Non-pressure chronic ulcer of other part of left lower leg limited to breakdown of skin: Secondary | ICD-10-CM | POA: Diagnosis not present

## 2017-07-31 DIAGNOSIS — I272 Pulmonary hypertension, unspecified: Secondary | ICD-10-CM | POA: Diagnosis not present

## 2017-08-01 LAB — FECAL OCCULT BLOOD, IMMUNOCHEMICAL: Fecal Occult Bld: NEGATIVE

## 2017-08-02 ENCOUNTER — Telehealth: Payer: Self-pay

## 2017-08-02 NOTE — Telephone Encounter (Signed)
Elmyra Ricks with [facility] want to informed the office of missed visit for PT on 08/02/2017 per patient request due to pain.

## 2017-08-02 NOTE — Telephone Encounter (Signed)
[place] noted. Patient does see pain management clinic.

## 2017-08-06 ENCOUNTER — Telehealth: Payer: Self-pay

## 2017-08-06 DIAGNOSIS — I739 Peripheral vascular disease, unspecified: Secondary | ICD-10-CM | POA: Diagnosis not present

## 2017-08-06 DIAGNOSIS — J449 Chronic obstructive pulmonary disease, unspecified: Secondary | ICD-10-CM | POA: Diagnosis not present

## 2017-08-06 DIAGNOSIS — M810 Age-related osteoporosis without current pathological fracture: Secondary | ICD-10-CM | POA: Diagnosis not present

## 2017-08-06 DIAGNOSIS — I480 Paroxysmal atrial fibrillation: Secondary | ICD-10-CM | POA: Diagnosis not present

## 2017-08-06 DIAGNOSIS — M170 Bilateral primary osteoarthritis of knee: Secondary | ICD-10-CM | POA: Diagnosis not present

## 2017-08-06 DIAGNOSIS — L03116 Cellulitis of left lower limb: Secondary | ICD-10-CM | POA: Diagnosis not present

## 2017-08-06 DIAGNOSIS — I110 Hypertensive heart disease with heart failure: Secondary | ICD-10-CM | POA: Diagnosis not present

## 2017-08-06 DIAGNOSIS — I272 Pulmonary hypertension, unspecified: Secondary | ICD-10-CM | POA: Diagnosis not present

## 2017-08-06 DIAGNOSIS — I5032 Chronic diastolic (congestive) heart failure: Secondary | ICD-10-CM | POA: Diagnosis not present

## 2017-08-06 DIAGNOSIS — G8929 Other chronic pain: Secondary | ICD-10-CM | POA: Diagnosis not present

## 2017-08-06 DIAGNOSIS — L97821 Non-pressure chronic ulcer of other part of left lower leg limited to breakdown of skin: Secondary | ICD-10-CM | POA: Diagnosis not present

## 2017-08-06 DIAGNOSIS — I872 Venous insufficiency (chronic) (peripheral): Secondary | ICD-10-CM | POA: Diagnosis not present

## 2017-08-06 NOTE — Telephone Encounter (Signed)
PT needs VO  For extended visits 2x week for 4 weeks, do you agree?

## 2017-08-06 NOTE — Telephone Encounter (Signed)
Sharon Vasquez with [facility] requesting VO for PT. Please call back.  

## 2017-08-07 NOTE — Telephone Encounter (Signed)
Agree with VO

## 2017-08-08 ENCOUNTER — Telehealth: Payer: Self-pay | Admitting: Internal Medicine

## 2017-08-08 DIAGNOSIS — M170 Bilateral primary osteoarthritis of knee: Secondary | ICD-10-CM | POA: Diagnosis not present

## 2017-08-08 DIAGNOSIS — M810 Age-related osteoporosis without current pathological fracture: Secondary | ICD-10-CM | POA: Diagnosis not present

## 2017-08-08 DIAGNOSIS — L97821 Non-pressure chronic ulcer of other part of left lower leg limited to breakdown of skin: Secondary | ICD-10-CM | POA: Diagnosis not present

## 2017-08-08 DIAGNOSIS — I272 Pulmonary hypertension, unspecified: Secondary | ICD-10-CM | POA: Diagnosis not present

## 2017-08-08 DIAGNOSIS — I5032 Chronic diastolic (congestive) heart failure: Secondary | ICD-10-CM | POA: Diagnosis not present

## 2017-08-08 DIAGNOSIS — I110 Hypertensive heart disease with heart failure: Secondary | ICD-10-CM | POA: Diagnosis not present

## 2017-08-08 DIAGNOSIS — G8929 Other chronic pain: Secondary | ICD-10-CM | POA: Diagnosis not present

## 2017-08-08 DIAGNOSIS — L03116 Cellulitis of left lower limb: Secondary | ICD-10-CM | POA: Diagnosis not present

## 2017-08-08 DIAGNOSIS — I480 Paroxysmal atrial fibrillation: Secondary | ICD-10-CM | POA: Diagnosis not present

## 2017-08-08 DIAGNOSIS — I739 Peripheral vascular disease, unspecified: Secondary | ICD-10-CM | POA: Diagnosis not present

## 2017-08-08 DIAGNOSIS — J449 Chronic obstructive pulmonary disease, unspecified: Secondary | ICD-10-CM | POA: Diagnosis not present

## 2017-08-08 DIAGNOSIS — I872 Venous insufficiency (chronic) (peripheral): Secondary | ICD-10-CM | POA: Diagnosis not present

## 2017-08-08 NOTE — Telephone Encounter (Signed)
Rec'd call from [facility] reporting that this paitne tis being D/C from there services.  She would also like a call back to report the patient's BP readings.

## 2017-08-08 NOTE — Telephone Encounter (Signed)
rtc to [facility], lm for rtc

## 2017-08-13 ENCOUNTER — Telehealth: Payer: Self-pay | Admitting: Internal Medicine

## 2017-08-13 DIAGNOSIS — I480 Paroxysmal atrial fibrillation: Secondary | ICD-10-CM | POA: Diagnosis not present

## 2017-08-13 DIAGNOSIS — L97821 Non-pressure chronic ulcer of other part of left lower leg limited to breakdown of skin: Secondary | ICD-10-CM | POA: Diagnosis not present

## 2017-08-13 DIAGNOSIS — I110 Hypertensive heart disease with heart failure: Secondary | ICD-10-CM | POA: Diagnosis not present

## 2017-08-13 DIAGNOSIS — M170 Bilateral primary osteoarthritis of knee: Secondary | ICD-10-CM | POA: Diagnosis not present

## 2017-08-13 DIAGNOSIS — I272 Pulmonary hypertension, unspecified: Secondary | ICD-10-CM | POA: Diagnosis not present

## 2017-08-13 DIAGNOSIS — M810 Age-related osteoporosis without current pathological fracture: Secondary | ICD-10-CM | POA: Diagnosis not present

## 2017-08-13 DIAGNOSIS — I872 Venous insufficiency (chronic) (peripheral): Secondary | ICD-10-CM

## 2017-08-13 DIAGNOSIS — I5032 Chronic diastolic (congestive) heart failure: Secondary | ICD-10-CM | POA: Diagnosis not present

## 2017-08-13 DIAGNOSIS — L03116 Cellulitis of left lower limb: Secondary | ICD-10-CM | POA: Diagnosis not present

## 2017-08-13 DIAGNOSIS — I739 Peripheral vascular disease, unspecified: Secondary | ICD-10-CM | POA: Diagnosis not present

## 2017-08-13 DIAGNOSIS — G8929 Other chronic pain: Secondary | ICD-10-CM | POA: Diagnosis not present

## 2017-08-13 DIAGNOSIS — J449 Chronic obstructive pulmonary disease, unspecified: Secondary | ICD-10-CM | POA: Diagnosis not present

## 2017-08-13 NOTE — Telephone Encounter (Signed)
Pt has called and has been working with PT with [facility].  Patient is in need of a Teaching laboratory technician for safety reasons.  Please Advice if  An order can be placed.

## 2017-08-13 NOTE — Telephone Encounter (Signed)
Can we confirm this with [facility]? Would be happy to order if they recommend.

## 2017-08-14 DIAGNOSIS — L97821 Non-pressure chronic ulcer of other part of left lower leg limited to breakdown of skin: Secondary | ICD-10-CM | POA: Diagnosis not present

## 2017-08-14 DIAGNOSIS — L03116 Cellulitis of left lower limb: Secondary | ICD-10-CM | POA: Diagnosis not present

## 2017-08-14 DIAGNOSIS — M170 Bilateral primary osteoarthritis of knee: Secondary | ICD-10-CM | POA: Diagnosis not present

## 2017-08-14 DIAGNOSIS — G8929 Other chronic pain: Secondary | ICD-10-CM | POA: Diagnosis not present

## 2017-08-14 DIAGNOSIS — I739 Peripheral vascular disease, unspecified: Secondary | ICD-10-CM | POA: Diagnosis not present

## 2017-08-14 DIAGNOSIS — I272 Pulmonary hypertension, unspecified: Secondary | ICD-10-CM | POA: Diagnosis not present

## 2017-08-14 DIAGNOSIS — I872 Venous insufficiency (chronic) (peripheral): Secondary | ICD-10-CM | POA: Diagnosis not present

## 2017-08-14 DIAGNOSIS — I5032 Chronic diastolic (congestive) heart failure: Secondary | ICD-10-CM | POA: Diagnosis not present

## 2017-08-14 DIAGNOSIS — I110 Hypertensive heart disease with heart failure: Secondary | ICD-10-CM | POA: Diagnosis not present

## 2017-08-14 DIAGNOSIS — M810 Age-related osteoporosis without current pathological fracture: Secondary | ICD-10-CM | POA: Diagnosis not present

## 2017-08-14 DIAGNOSIS — J449 Chronic obstructive pulmonary disease, unspecified: Secondary | ICD-10-CM | POA: Diagnosis not present

## 2017-08-14 DIAGNOSIS — I480 Paroxysmal atrial fibrillation: Secondary | ICD-10-CM | POA: Diagnosis not present

## 2017-08-20 ENCOUNTER — Telehealth: Payer: Self-pay | Admitting: *Deleted

## 2017-08-20 NOTE — Telephone Encounter (Signed)
Patient called in stating she can no longer go to current pain management clinic as it is too far away and she has no transportation. Requesting referral to closer clinic where [facility] can take her. Will route to Referral Coordinator and PCP. Hubbard Hartshorn, RN, BSN

## 2017-08-20 NOTE — Telephone Encounter (Signed)
Patient called back wanting to know if walker has been ordered. Called Brandy with [employer]. No answer and VM box is full. Spoke with Elmyra Ricks, PT assistant [phone]) who confirmed rolator has been recommended for pt. Will route to PCP. Hubbard Hartshorn, RN, BSN

## 2017-08-21 NOTE — Telephone Encounter (Signed)
I appreciate obtaining records I have reviewed the April 23 visit, and previous UDS results, Appears she was inapproate with UDS on first 2, 1st with Cocaine, Oxycodone and Hydrocodone, then inapproate with cocaine and absent oxycodone (was precribed), then 3rd test appropriate for oxycodone.  It seems Dr Julien Girt has given her changes and has paid off, I would advise her to stick with Dr Julien Girt, I am not sure another clinic would take her given these previous UDS results.

## 2017-08-21 NOTE — Telephone Encounter (Signed)
Noted, will need to make sure we have all records from Dr Plumner's office first. Last note I have on March 22 noted inappropriate UDS.

## 2017-08-23 DIAGNOSIS — M170 Bilateral primary osteoarthritis of knee: Secondary | ICD-10-CM | POA: Diagnosis not present

## 2017-08-24 DIAGNOSIS — I480 Paroxysmal atrial fibrillation: Secondary | ICD-10-CM | POA: Diagnosis not present

## 2017-08-24 DIAGNOSIS — I5032 Chronic diastolic (congestive) heart failure: Secondary | ICD-10-CM | POA: Diagnosis not present

## 2017-08-24 DIAGNOSIS — J449 Chronic obstructive pulmonary disease, unspecified: Secondary | ICD-10-CM | POA: Diagnosis not present

## 2017-08-24 DIAGNOSIS — L97821 Non-pressure chronic ulcer of other part of left lower leg limited to breakdown of skin: Secondary | ICD-10-CM | POA: Diagnosis not present

## 2017-08-24 DIAGNOSIS — M810 Age-related osteoporosis without current pathological fracture: Secondary | ICD-10-CM | POA: Diagnosis not present

## 2017-08-24 DIAGNOSIS — I739 Peripheral vascular disease, unspecified: Secondary | ICD-10-CM | POA: Diagnosis not present

## 2017-08-24 DIAGNOSIS — I872 Venous insufficiency (chronic) (peripheral): Secondary | ICD-10-CM | POA: Diagnosis not present

## 2017-08-24 DIAGNOSIS — L03116 Cellulitis of left lower limb: Secondary | ICD-10-CM | POA: Diagnosis not present

## 2017-08-24 DIAGNOSIS — M170 Bilateral primary osteoarthritis of knee: Secondary | ICD-10-CM | POA: Diagnosis not present

## 2017-08-24 DIAGNOSIS — I110 Hypertensive heart disease with heart failure: Secondary | ICD-10-CM | POA: Diagnosis not present

## 2017-08-24 DIAGNOSIS — I272 Pulmonary hypertension, unspecified: Secondary | ICD-10-CM | POA: Diagnosis not present

## 2017-08-24 DIAGNOSIS — G8929 Other chronic pain: Secondary | ICD-10-CM | POA: Diagnosis not present

## 2017-08-29 DIAGNOSIS — I110 Hypertensive heart disease with heart failure: Secondary | ICD-10-CM | POA: Diagnosis not present

## 2017-08-29 DIAGNOSIS — L97821 Non-pressure chronic ulcer of other part of left lower leg limited to breakdown of skin: Secondary | ICD-10-CM | POA: Diagnosis not present

## 2017-08-29 DIAGNOSIS — I480 Paroxysmal atrial fibrillation: Secondary | ICD-10-CM | POA: Diagnosis not present

## 2017-08-29 DIAGNOSIS — J449 Chronic obstructive pulmonary disease, unspecified: Secondary | ICD-10-CM | POA: Diagnosis not present

## 2017-08-29 DIAGNOSIS — I5032 Chronic diastolic (congestive) heart failure: Secondary | ICD-10-CM | POA: Diagnosis not present

## 2017-08-29 DIAGNOSIS — I272 Pulmonary hypertension, unspecified: Secondary | ICD-10-CM | POA: Diagnosis not present

## 2017-08-29 DIAGNOSIS — L03116 Cellulitis of left lower limb: Secondary | ICD-10-CM | POA: Diagnosis not present

## 2017-08-29 DIAGNOSIS — I872 Venous insufficiency (chronic) (peripheral): Secondary | ICD-10-CM | POA: Diagnosis not present

## 2017-08-29 DIAGNOSIS — M810 Age-related osteoporosis without current pathological fracture: Secondary | ICD-10-CM | POA: Diagnosis not present

## 2017-08-29 DIAGNOSIS — M170 Bilateral primary osteoarthritis of knee: Secondary | ICD-10-CM | POA: Diagnosis not present

## 2017-08-29 DIAGNOSIS — G8929 Other chronic pain: Secondary | ICD-10-CM | POA: Diagnosis not present

## 2017-08-29 DIAGNOSIS — I739 Peripheral vascular disease, unspecified: Secondary | ICD-10-CM | POA: Diagnosis not present

## 2017-08-29 NOTE — Telephone Encounter (Signed)
Order was faxed to [facility] @ [phone].  Please have patient call [phone] if any questions.

## 2017-09-05 ENCOUNTER — Other Ambulatory Visit: Payer: Self-pay | Admitting: Pharmacist

## 2017-09-05 DIAGNOSIS — I739 Peripheral vascular disease, unspecified: Secondary | ICD-10-CM

## 2017-09-05 DIAGNOSIS — I480 Paroxysmal atrial fibrillation: Secondary | ICD-10-CM

## 2017-09-05 MED ORDER — ATORVASTATIN CALCIUM 40 MG PO TABS: 40.0000 mg | ORAL_TABLET | Freq: Every day | ORAL | 3 refills | Status: DC

## 2017-09-05 MED ORDER — RIVAROXABAN 20 MG PO TABS: 20.0000 mg | ORAL_TABLET | ORAL | 5 refills | Status: DC

## 2017-09-20 ENCOUNTER — Telehealth: Payer: Self-pay | Admitting: Internal Medicine

## 2017-09-20 NOTE — Telephone Encounter (Signed)
Please call patient back, unsure who called

## 2017-09-20 NOTE — Telephone Encounter (Signed)
Pt states she wants a new doctor, that dr Heber Woodbridge is "pissy and arrogant" she knows he's a good doctor but she does not like what comes out of his mouth. She prefers dr Reesa Chew or dr Trilby Drummer. She also does not want [facility] calling her anymore " their about to piss me off too"

## 2017-09-23 DIAGNOSIS — M170 Bilateral primary osteoarthritis of knee: Secondary | ICD-10-CM | POA: Diagnosis not present

## 2017-10-17 ENCOUNTER — Ambulatory Visit (INDEPENDENT_AMBULATORY_CARE_PROVIDER_SITE_OTHER): Payer: Medicare Other | Admitting: Internal Medicine

## 2017-10-17 ENCOUNTER — Other Ambulatory Visit: Payer: Self-pay

## 2017-10-17 ENCOUNTER — Encounter: Payer: Self-pay | Admitting: Internal Medicine

## 2017-10-17 VITALS — BP 132/84 | HR 59 | Temp 98.1°F | Ht 74.0 in | Wt 320.4 lb

## 2017-10-17 DIAGNOSIS — Z7901 Long term (current) use of anticoagulants: Secondary | ICD-10-CM

## 2017-10-17 DIAGNOSIS — J449 Chronic obstructive pulmonary disease, unspecified: Secondary | ICD-10-CM

## 2017-10-17 DIAGNOSIS — I509 Heart failure, unspecified: Secondary | ICD-10-CM

## 2017-10-17 DIAGNOSIS — M170 Bilateral primary osteoarthritis of knee: Secondary | ICD-10-CM

## 2017-10-17 DIAGNOSIS — Z6841 Body Mass Index (BMI) 40.0 and over, adult: Secondary | ICD-10-CM

## 2017-10-17 DIAGNOSIS — Z79899 Other long term (current) drug therapy: Secondary | ICD-10-CM

## 2017-10-17 DIAGNOSIS — Z791 Long term (current) use of non-steroidal anti-inflammatories (NSAID): Secondary | ICD-10-CM

## 2017-10-17 DIAGNOSIS — Z79891 Long term (current) use of opiate analgesic: Secondary | ICD-10-CM

## 2017-10-17 DIAGNOSIS — G8929 Other chronic pain: Secondary | ICD-10-CM

## 2017-10-17 DIAGNOSIS — Z7951 Long term (current) use of inhaled steroids: Secondary | ICD-10-CM

## 2017-10-17 DIAGNOSIS — F17210 Nicotine dependence, cigarettes, uncomplicated: Secondary | ICD-10-CM

## 2017-10-17 MED ORDER — NAPROXEN 500 MG PO TBEC: 500.0000 mg | DELAYED_RELEASE_TABLET | Freq: Two times a day (BID) | ORAL | 3 refills | Status: DC

## 2017-10-17 MED ORDER — TIOTROPIUM BROMIDE MONOHYDRATE 18 MCG IN CAPS: 18.0000 ug | ORAL_CAPSULE | Freq: Every day | RESPIRATORY_TRACT | 11 refills | Status: DC

## 2017-10-17 MED ORDER — OXYCODONE-ACETAMINOPHEN 5-325 MG PO TABS: 1.0000 | ORAL_TABLET | Freq: Every day | ORAL | 0 refills | Status: DC | PRN

## 2017-10-17 MED ORDER — ALBUTEROL SULFATE (2.5 MG/3ML) 0.083% IN NEBU: 2.5000 mg | INHALATION_SOLUTION | RESPIRATORY_TRACT | 2 refills | Status: DC | PRN

## 2017-10-17 NOTE — Progress Notes (Signed)
CC: Knee Pain, Shortness of breath  HPI: Ms.Jullie D Vasquez is a 67 y.o. F w/ PMH of COPD, CHF, and morbid obesity presenting to the clinic with complaints of left knee pain. She states this is a chronic issue which she had been regularly going to pain management for, but the co-pay is no longer affordable for her. She states she has not been taking any percocet, which was the treatment for pain relief in the past, for the last 2 months and the pain is no longer tolerable. She states that she is taking 800mg  Ibuprofen for relief which is effective sometimes, but not always. Pain is described as 10/10 throbbing pain. Denies any fever, chills, numbness, or weakness  Patient also is requesting a referral for pulmonology as she feels out of breath with exertion. She travels in a scooter because she cannot handle walking and sometimes when she is helping move groceries or walk short distances at home, she feels out of breath and needs to stop. She mentions that she is still smoking <half pack a day but she feels she needs stronger intervention. She states she is using her Spiriva, albuetrol nebulizer and Advair almost every other day and needs refill of those medications as well today. Denies any productive sputum or chest congestion.  Past Medical History:  Diagnosis Date  . A-fib ([place])   . Anxiety   . Arthritis    "qwhre; joints, back" (04/17/2017)  . Benign breast cyst in female, left 01/08/2017   Found by Screening mammogram, evaluated by U/S on 01/08/17 and determined to be a benign simple breast cyst.  . Cellulitis of left lower leg 05/30/2017  . CHF (congestive heart failure) ([place])   . Chronic lower back pain   . Chronic venous insufficiency    [name] 05/30/2017  . COPD (chronic obstructive pulmonary disease) ([place])   . Depression   . DVT (deep venous thrombosis) ([place]) 11/16/2016  . GERD (gastroesophageal reflux disease)   . Headache    "weekly for the last 3 months" (04/17/2017)  .  Hyperlipidemia   . Hypertension   . Morbid obesity ([place])   . PE (pulmonary embolism)   . Pulmonary embolism ([place]) 09/21/2014    Review of Systems: Review of Systems  Constitutional: Negative for chills, fever, malaise/fatigue and weight loss.  Respiratory: Positive for cough and shortness of breath. Negative for hemoptysis, sputum production and wheezing.   Cardiovascular: Positive for leg swelling. Negative for chest pain, palpitations, orthopnea and claudication.  Musculoskeletal: Positive for joint pain. Negative for back pain, falls and neck pain.     Physical Exam: Vitals:   10/17/17 0846 10/17/17 0921  BP: (!) 153/84 132/84  Pulse: (!) 59   Temp: 98.1 F (36.7 C)   SpO2: 97%    Physical Exam  Constitutional: She is oriented to person, place, and time. She appears well-developed and well-nourished. No distress.  Cardiovascular: Normal rate, regular rhythm, normal heart sounds and intact distal pulses.  Respiratory: Effort normal. No respiratory distress. She has no wheezes. She has no rales.  Distant breath sounds  Musculoskeletal: She exhibits edema (non-pitting edema lower extremities bilaterally) and tenderness (Tenderness on palpation around joint line of left knee). She exhibits no deformity.  Left knee slightly greater than right in size. No erythema, warmness, effusion. Range of motion appears intact  Neurological: She is alert and oriented to person, place, and time. She has normal reflexes. Coordination normal.      Assessment & Plan:  See Encounters Tab for problem based charting.  Patient seen with Dr. Evette Doffing   -Gilberto Better, PGY1

## 2017-10-17 NOTE — Assessment & Plan Note (Signed)
-   Patient endorsing significant pain of left knee, which is a chronic issue - Prior visits with ortho show severe osteoarthritis of the knee but not candidate for surgery due to BMI and comorbidities - Patient states she stopped going to pain management due to financial limitations - Currently using Ibuprofen for relief but requesting naproxen as it has been helpful in the past  - Patient understands that she must have significant weight loss before she is eligible for a knee replacement, which is the definitive treatment for her pain - Discussed with pt about importance of not taking NSAIDs daily due to risk of gastric bleeding and kidney injury especially in the setting of her Xarelto  - Given Naproxen 500mg  w/ explanation of taking only when when pain is present - Given 30 tablets of Percocet w/ discussion of taking it only when severe pain is present

## 2017-10-17 NOTE — Assessment & Plan Note (Addendum)
-   Patient complains of frequent shortness of breath events on exertion - Currently on Advair 1 puff BID, Spiriva 66mcg daily, Albuterol 0.083% nebulizer solution, Albuterol inhaler PRN - States she uses her nebulizer and/or inhalers every other day - Patient requests referral for pulmonology as she wants a specialist to take a look at her breathing problems - Patient continues to smoke ~half a pack a day - Physical exam: Distant breath sounds, no significant wheezing, satting 97% on room air  - Counseled pt on importance of quitting smoking but patient states she is not ready to quit smoking - Refilled her Albuterol nebulizer solution and Spiriva, Put in referral for pulm

## 2017-10-17 NOTE — Patient Instructions (Signed)
Mrs. Sharon Vasquez came to [place] with complaints of knee pain and request for referral for pulmonology. We are prescribing some pain medications to help your knees feel better and get you moving around. We recommend that you do not take these pills everyday but only when you are having severe worsening of pain. Also we are refilling your COPD medication and sending out a referral for pulmonology. Please let us know if you have any trouble taking your medication. Thank you for visiting the clinic  -Gilberto Better

## 2017-10-18 NOTE — Progress Notes (Signed)
Internal Medicine Clinic Attending  I saw and evaluated the patient.  I personally confirmed the key portions of the history and exam documented by Dr. Truman Hayward and I reviewed pertinent patient test results.  The assessment, diagnosis, and plan were formulated together and I agree with the documentation in the resident's note.  Worsening chronic left knee pain with severe OA as the pain generator. Not a candidate for knee replacement due to obesity. Has not had steroid injections before, no significant effusion on exam today. We talked about supportive care, NSAIDs as needed, maybe injections in the future as a trial. She finds good functional benefit from occasional percocet on bad days, last #30 supply lasted about 3 months. Referral to pain management has been delayed by financial barrier and some kind of problem with urine substance testing. Patient has no other known substance use disorder. I have refilled percocet #30 to be used only on bad days to improve functioning. Patient has reasonable expectations and unfortunately limited options for management.

## 2017-10-23 DIAGNOSIS — M170 Bilateral primary osteoarthritis of knee: Secondary | ICD-10-CM | POA: Diagnosis not present

## 2017-11-08 ENCOUNTER — Ambulatory Visit: Payer: Medicare Other | Admitting: Internal Medicine

## 2017-11-14 ENCOUNTER — Ambulatory Visit (INDEPENDENT_AMBULATORY_CARE_PROVIDER_SITE_OTHER): Payer: Medicare Other | Admitting: Pulmonary Disease

## 2017-11-14 ENCOUNTER — Ambulatory Visit (INDEPENDENT_AMBULATORY_CARE_PROVIDER_SITE_OTHER)
Admission: RE | Admit: 2017-11-14 | Discharge: 2017-11-14 | Disposition: A | Discharge status: Home / Self Care | Payer: Medicare Other | Source: Ambulatory Visit | Attending: Pulmonary Disease | Admitting: Pulmonary Disease

## 2017-11-14 ENCOUNTER — Encounter: Payer: Self-pay | Admitting: Pulmonary Disease

## 2017-11-14 VITALS — BP 170/98 | HR 78 | Ht 74.0 in | Wt 333.0 lb

## 2017-11-14 DIAGNOSIS — I5032 Chronic diastolic (congestive) heart failure: Secondary | ICD-10-CM | POA: Diagnosis not present

## 2017-11-14 DIAGNOSIS — I480 Paroxysmal atrial fibrillation: Secondary | ICD-10-CM | POA: Diagnosis not present

## 2017-11-14 DIAGNOSIS — Z720 Tobacco use: Secondary | ICD-10-CM | POA: Diagnosis not present

## 2017-11-14 DIAGNOSIS — G4733 Obstructive sleep apnea (adult) (pediatric): Secondary | ICD-10-CM

## 2017-11-14 DIAGNOSIS — J449 Chronic obstructive pulmonary disease, unspecified: Secondary | ICD-10-CM

## 2017-11-14 DIAGNOSIS — R0602 Shortness of breath: Secondary | ICD-10-CM | POA: Diagnosis not present

## 2017-11-14 MED ORDER — PREDNISONE 10 MG PO TABS: ORAL_TABLET | ORAL | 0 refills | Status: DC

## 2017-11-14 NOTE — Assessment & Plan Note (Signed)
She likely has underlying OSA  Given excessive daytime somnolence, narrow pharyngeal exam, witnessed apneas & loud snoring, obstructive sleep apnea is very likely & an overnight polysomnogram will be scheduled as a home study. The pathophysiology of obstructive sleep apnea , it's cardiovascular consequences & modes of treatment including CPAP were discused with the patient in detail & they evidenced understanding.

## 2017-11-14 NOTE — Assessment & Plan Note (Signed)
CXR & DLCO check for chronic amiodarone use

## 2017-11-14 NOTE — Progress Notes (Signed)
Subjective:    Patient ID: Sharon Vasquez, female    DOB: 08-31-1950, 67 y.o.   [name]: [id number]  HPI  Chief Complaint  Patient presents with  . Pulm Consult    Referred by Dr. Kristie Cowman for COPD and SOB. Per patient she gets SOB with any activity.    67 year old smoker with multiple medical issues presents for evaluation of dyspnea on exertion. She carries a diagnosis of chronic diastolic heart failure.  She has a history of pulmonary embolism/ LLE DVT 2015 and has been maintained on Xarelto since then.  She underwent venoplasty and stenting of left common iliac and external iliac vein Duplex 05/2017 did not show any residual stenosis. She has been maintained on amiodarone and Xarelto for chronic atrial fibrillation, last cardiology evaluation was 08/2016 and she has not been to them since. She has severe bilateral osteoarthritis of knees but unfortunately total knee replacement has been turned down due to medical issues. She has been maintained on a regimen of Advair and Spiriva and has had to use increasingly albuterol nebs over the last 2 weeks.  She reports occasional wheezing. She denies cough. She has chronic bipedal edema for which she uses stockings and leg elevation.  She ambulates with a walker and is more limited by osteoarthritis rather than by dyspnea.  Chest x-ray 05/2017 was reviewed which shows cardiomegaly and interstitial edema. CT chest 12/2016 shows hazy bilateral groundglass infiltrates suggestive of pulmonary edema. CT chest 12/2015 also shows edema pattern  She also reports excessive daytime somnolence and fatigue over the last few months. Epworth sleepiness score is 12 Bedtime is around 10 PM, she reports 2 pillow orthopnea, loud snoring is been noted by family members, she reports occasional choking episode but is woken her up in her sleep.  She had a sleep study done years ago but does not remember the results.  She is to smoke as much as 2 packs/day but now has been  able to cut down to about 4 cigarettes a day, more than 50 pack years. 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 A-fib ([place])   . Anxiety   . Arthritis    "qwhre; joints, back" (04/17/2017)  . Benign breast cyst in female, left 01/08/2017   Found by Screening mammogram, evaluated by U/S on 01/08/17 and determined to be a benign simple breast cyst.  . Cellulitis of left lower leg 05/30/2017  . CHF (congestive heart failure) ([place])   . Chronic lower back pain   . Chronic venous insufficiency    [name] 05/30/2017  . COPD (chronic obstructive pulmonary disease) ([place])   . Depression   . DVT (deep venous thrombosis) ([place]) 11/16/2016  . GERD (gastroesophageal reflux disease)   . Headache    "weekly for the last 3 months" (04/17/2017)  . Hyperlipidemia   . Hypertension   . Morbid obesity ([place])   . PE (pulmonary embolism)   . Pulmonary embolism ([place]) 09/21/2014    Past Surgical History:  Procedure Laterality Date  . ABDOMINAL HYSTERECTOMY    . APPENDECTOMY    . BREAST CYST EXCISION Left    "six o'clock"  . BREAST LUMPECTOMY Left   . CHOLECYSTECTOMY    . DILATION AND CURETTAGE OF UTERUS    .  TONSILLECTOMY AND ADENOIDECTOMY    . TUBAL LIGATION      No Known Allergies  Social History   Socioeconomic History  . Marital status: Divorced    Spouse name: Not on file  . Number of children: Not on file  . Years of education: Not on file  . Highest education level: Not on file  Occupational History  . Not on file  Social Needs  . Financial resource strain: Not on file  . Food insecurity:    Worry: Not on file    Inability: Not on file  . Transportation needs:    Medical: Not on file    Non-medical: Not on file  Tobacco Use  . Smoking status: Current Some Day Smoker     Packs/day: 0.10    Years: 55.00    Pack years: 5.50    Types: Cigarettes    Start date: 08/16/1961  . Smokeless tobacco: Never Used  Substance and Sexual Activity  . Alcohol use: No  . Drug use: No  . Sexual activity: Not Currently    Partners: Male  Lifestyle  . Physical activity:    Days per week: Not on file    Minutes per session: Not on file  . Stress: Not on file  Relationships  . Social connections:    Talks on phone: Not on file    Gets together: Not on file    Attends religious service: Not on file    Active member of club or organization: Not on file    Attends meetings of clubs or organizations: Not on file    Relationship status: Not on file  . Intimate partner violence:    Fear of current or ex partner: Not on file    Emotionally abused: Not on file    Physically abused: Not on file    Forced sexual activity: Not on file  Other Topics Concern  . Not on file  Social History Narrative   Lives in [facility] in [place]. Lives alone, but is dependent in ADLs/IADLs. 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 Two daughters live nearby. Previously resided in [place].     Family History  Problem Relation Age of Onset  . Breast cancer Mother   . Hypertension Mother   . Hyperlipidemia Mother   . Hypertension Maternal Grandfather   . Hyperlipidemia Maternal Grandfather      Review of Systems  Constitutional: Negative for fever and unexpected weight change.  HENT: Positive for congestion. Negative for dental problem, ear pain, nosebleeds, postnasal drip, rhinorrhea, sinus pressure, sneezing, sore throat and trouble swallowing.   Eyes: Negative for redness and itching.  Respiratory: Positive for cough and shortness of breath. Negative for chest tightness and wheezing.   Cardiovascular: Negative for palpitations and leg swelling.  Gastrointestinal: Negative for nausea and vomiting.  Genitourinary: Negative for dysuria.    Musculoskeletal: Positive for joint swelling.  Skin: Negative for rash.  Allergic/Immunologic: Negative.  Negative for environmental allergies, food allergies and immunocompromised state.  Neurological: Negative for headaches.  Hematological: Does not bruise/bleed easily.  Psychiatric/Behavioral: Positive for dysphoric mood. The patient is nervous/anxious.        Objective:   Physical Exam   Gen. Pleasant, obese, in no distress, normal affect ENT - no lesions, no post nasal drip, class 3 airway Neck: No JVD, no thyromegaly, no carotid bruits Lungs: no use of accessory muscles, no dullness to percussion, decreased without rales,  faint exp rhonchi  Cardiovascular: Rhythm regular, heart sounds  normal, no murmurs or gallops, 2+ peripheral edema Abdomen: soft and non-tender, no hepatosplenomegaly, BS normal. Musculoskeletal: No deformities, no cyanosis or clubbing, ambulates with walker Neuro:  alert, non focal, no tremors        Assessment & Plan:

## 2017-11-14 NOTE — Assessment & Plan Note (Signed)
Smoking cessation was emphasized 

## 2017-11-14 NOTE — Patient Instructions (Signed)
Your shortness of breath could be related to lung causes such as COPD or due to fluid buildup from your congestive heart failure  Okay to use albuterol nebs every 6 hour up to thrice daily as needed for wheezing. Prednisone 10 mg tabs  Take 2 tabs daily with food x 5ds, then 1 tab daily with food x 5ds then STOP  Schedule PFTs. Chest x-ray today. Schedule home sleep test  Referral for follow-up appointment with cardiology

## 2017-11-14 NOTE — Addendum Note (Signed)
Addended by: Valerie Salts on: 11/14/2017 02:47 PM   Modules accepted: Orders

## 2017-11-14 NOTE — Assessment & Plan Note (Signed)
>>  ASSESSMENT AND PLAN FOR DIASTOLIC HEART FAILURE (HCC) WRITTEN ON 11/14/2017  2:32 PM BY ALVA, RAKESH V, MD  Referral for follow-up appointment with cardiology

## 2017-11-14 NOTE — Assessment & Plan Note (Signed)
Referral for follow-up appointment with cardiology

## 2017-11-14 NOTE — Assessment & Plan Note (Signed)
Shortness of breath could be multifactorial -She does seem to be having a mild COPD exacerbation with some bronchospasm.  Could also be related to acute on chronic diastolic heart failure.  Amiodarone toxicity is also possible  Will obtain PFTs to clarify  Okay to use albuterol nebs every 6 hour up to thrice daily as needed for wheezing. Prednisone 10 mg tabs  Take 2 tabs daily with food x 5ds, then 1 tab daily with food x 5ds then STOP  Schedule PFTs. Chest x-ray today.

## 2017-11-16 NOTE — Progress Notes (Signed)
LMTCB

## 2017-11-21 ENCOUNTER — Telehealth: Payer: Self-pay | Admitting: Pulmonary Disease

## 2017-11-21 NOTE — Telephone Encounter (Signed)
Agree not to take NSAIDs with prednisone-

## 2017-11-21 NOTE — Telephone Encounter (Signed)
Called pt and advised message from the provider. Pt understood and verbalized understanding. Nothing further is needed.    

## 2017-11-21 NOTE — Telephone Encounter (Signed)
Spoke with pt, she would like to know if the prednisone reacts with any of her other medications. She mentioned Xarelto and Naproxen. I advised her to not take any NSAIDS with the Prednisone. Dr. Elsworth Soho do you have any more suggestions? Please advise.

## 2017-11-23 DIAGNOSIS — M170 Bilateral primary osteoarthritis of knee: Secondary | ICD-10-CM | POA: Diagnosis not present

## 2017-12-04 ENCOUNTER — Encounter (HOSPITAL_COMMUNITY): Payer: Self-pay | Admitting: Emergency Medicine

## 2017-12-04 ENCOUNTER — Emergency Department (HOSPITAL_COMMUNITY): Payer: Medicare Other

## 2017-12-04 ENCOUNTER — Emergency Department (HOSPITAL_COMMUNITY)
Admission: EM | Admit: 2017-12-04 | Discharge: 2017-12-05 | Disposition: A | Discharge status: Home / Self Care | Payer: Medicare Other | Attending: Emergency Medicine | Admitting: Emergency Medicine

## 2017-12-04 DIAGNOSIS — R0609 Other forms of dyspnea: Secondary | ICD-10-CM

## 2017-12-04 DIAGNOSIS — R609 Edema, unspecified: Secondary | ICD-10-CM

## 2017-12-04 DIAGNOSIS — F17210 Nicotine dependence, cigarettes, uncomplicated: Secondary | ICD-10-CM | POA: Insufficient documentation

## 2017-12-04 DIAGNOSIS — M7989 Other specified soft tissue disorders: Secondary | ICD-10-CM

## 2017-12-04 DIAGNOSIS — Z79899 Other long term (current) drug therapy: Secondary | ICD-10-CM | POA: Insufficient documentation

## 2017-12-04 DIAGNOSIS — I509 Heart failure, unspecified: Secondary | ICD-10-CM | POA: Diagnosis not present

## 2017-12-04 DIAGNOSIS — R079 Chest pain, unspecified: Secondary | ICD-10-CM | POA: Diagnosis present

## 2017-12-04 DIAGNOSIS — I110 Hypertensive heart disease with heart failure: Secondary | ICD-10-CM | POA: Diagnosis not present

## 2017-12-04 DIAGNOSIS — J449 Chronic obstructive pulmonary disease, unspecified: Secondary | ICD-10-CM | POA: Diagnosis not present

## 2017-12-04 DIAGNOSIS — Z7982 Long term (current) use of aspirin: Secondary | ICD-10-CM | POA: Insufficient documentation

## 2017-12-04 DIAGNOSIS — R0602 Shortness of breath: Secondary | ICD-10-CM | POA: Diagnosis not present

## 2017-12-04 DIAGNOSIS — R600 Localized edema: Secondary | ICD-10-CM | POA: Insufficient documentation

## 2017-12-04 LAB — CBC
HCT: 42.2 % (ref 36.0–46.0)
Hemoglobin: 13.7 g/dL (ref 12.0–15.0)
MCH: 29.1 pg (ref 26.0–34.0)
MCHC: 32.5 g/dL (ref 30.0–36.0)
MCV: 89.6 fL (ref 78.0–100.0)
Platelets: 370 10*3/uL (ref 150–400)
RBC: 4.71 MIL/uL (ref 3.87–5.11)
RDW: 14 % (ref 11.5–15.5)
WBC: 6 10*3/uL (ref 4.0–10.5)

## 2017-12-04 LAB — BASIC METABOLIC PANEL
Anion gap: 8 (ref 5–15)
BUN: 13 mg/dL (ref 8–23)
CO2: 25 mmol/L (ref 22–32)
Calcium: 8.6 mg/dL — ABNORMAL LOW (ref 8.9–10.3)
Chloride: 112 mmol/L — ABNORMAL HIGH (ref 98–111)
Creatinine, Ser: 1.2 mg/dL — ABNORMAL HIGH (ref 0.44–1.00)
GFR calc Af Amer: 53 mL/min — ABNORMAL LOW (ref >60)
GFR calc non Af Amer: 45 mL/min — ABNORMAL LOW (ref >60)
Glucose, Bld: 96 mg/dL (ref 70–99)
Potassium: 3.3 mmol/L — ABNORMAL LOW (ref 3.5–5.1)
Sodium: 145 mmol/L (ref 135–145)

## 2017-12-04 LAB — I-STAT TROPONIN, ED: Troponin i, poc: 0.02 ng/mL (ref 0.00–0.08)

## 2017-12-04 MED ORDER — OXYCODONE-ACETAMINOPHEN 5-325 MG PO TABS
1.0000 | ORAL_TABLET | Freq: Once | ORAL | Status: AC
  Administered 2017-12-04: 1 via ORAL
  Filled 2017-12-04: qty 1

## 2017-12-04 MED ORDER — ALBUTEROL SULFATE (2.5 MG/3ML) 0.083% IN NEBU
5.0000 mg | INHALATION_SOLUTION | Freq: Once | RESPIRATORY_TRACT | Status: AC
  Administered 2017-12-04: 5 mg via RESPIRATORY_TRACT
  Filled 2017-12-04: qty 6

## 2017-12-04 NOTE — ED Triage Notes (Signed)
Pt reports her legs became swollen and began hurting yesterday as well as SOB and CP.  Pt needed assistance to get out of the car due to SOB and weakness.  Pt now reports the entire right side of her body is hurting.  Pt has had several falls, is on blood thinners.

## 2017-12-04 NOTE — ED Notes (Signed)
Patient transported to X-ray 

## 2017-12-04 NOTE — ED Notes (Signed)
Per patient she could not walk since yesterday and does not know why and also she could not catch her breathe.

## 2017-12-04 NOTE — ED Provider Notes (Signed)
[facility] Provider Note   CSN: [id number] Arrival date & time: 12/04/17  2003     History   Chief Complaint Chief Complaint  Patient presents with  . Chest Pain  . Shortness of Breath  . Leg Swelling    HPI Sharon Vasquez is a 67 y.o. female.  The patient reports that last week she was placed on a steroid and was told to stop taking her NSAIDs (which she takes for her osteoarthritis) that time, she has had significant worsening of her pain.  She has had such severe knee pain that she is been unable to ambulate to the bathroom and has had several episodes of incontinence.  This is very distressing to the patient who reports that she just needs to get something for pain and then she will be okay.  She reports that she has chest pain shortness of breath that is associated with walking.  She reports that when she ambulates, she has pain that radiates from her knees to her entire body, the pain is so severe that it makes her short of breath.  The history is provided by the patient.  Leg Pain   This is a new problem. The current episode started more than 1 week ago. The problem occurs constantly. The problem has been rapidly worsening. The pain is present in the left knee and right knee. The quality of the pain is described as sharp, aching and constant. The pain is at a severity of 10/10. The pain is severe. Associated symptoms include limited range of motion. The symptoms are aggravated by activity. She has tried OTC pain medications (patient reports she was told to stop taking her NSAIDs due to medication interactions) for the symptoms. The treatment provided no relief. There has been no history of extremity trauma.    Past Medical History:  Diagnosis Date  . A-fib ([place])   . Anxiety   . Arthritis    "qwhre; joints, back" (04/17/2017)  . Benign breast cyst in female, left 01/08/2017   Found by Screening mammogram, evaluated by U/S on 01/08/17 and  determined to be a benign simple breast cyst.  . Cellulitis of left lower leg 05/30/2017  . CHF (congestive heart failure) ([place])   . Chronic lower back pain   . Chronic venous insufficiency    [name] 05/30/2017  . COPD (chronic obstructive pulmonary disease) ([place])   . Depression   . DVT (deep venous thrombosis) ([place]) 11/16/2016  . GERD (gastroesophageal reflux disease)   . Headache    "weekly for the last 3 months" (04/17/2017)  . Hyperlipidemia   . Hypertension   . Morbid obesity ([place])   . PE (pulmonary embolism)   . Pulmonary embolism ([place]) 09/21/2014    Patient Active Problem List   Diagnosis Date Noted  . OSA (obstructive sleep apnea) 11/14/2017  . Chronic pain syndrome 07/27/2017  . Major depression, recurrent, chronic ([place]) 02/01/2017  . Osteoporosis 12/14/2016  . Chronic low back pain 08/21/2016  . Drug induced constipation 08/21/2016  . Elevated blood sugar 07/19/2016  . Aortic atherosclerosis ([place]) 07/17/2016  . Chronic venous insufficiency 07/12/2016  . Chronic anticoagulation 07/12/2016  . Tobacco use 07/11/2016  . COPD (chronic obstructive pulmonary disease) ([place])   . (HFpEF) heart failure with preserved ejection fraction ([place])   . History of pulmonary embolism   . PAF (paroxysmal atrial fibrillation) ([place])   . Vocal cord polyp 12/18/2015  . Laryngopharyngeal reflux 12/18/2015  . Morbid obesity (  [place])   . Peripheral vascular disease ([place])   . Benign essential HTN   . Primary osteoarthritis of both knees 09/16/2014    Past Surgical History:  Procedure Laterality Date  . ABDOMINAL HYSTERECTOMY    . APPENDECTOMY    . BREAST CYST EXCISION Left    "six o'clock"  . BREAST LUMPECTOMY Left   . CHOLECYSTECTOMY    . DILATION AND CURETTAGE OF UTERUS    . TONSILLECTOMY AND ADENOIDECTOMY    . TUBAL LIGATION       OB History   None      Home Medications    Prior to Admission medications   Medication Sig Start Date End Date Taking? Authorizing Provider    albuterol (PROAIR HFA) 108 (90 Base) MCG/ACT inhaler Inhale 2 puffs into the lungs every 6 (six) hours as needed for wheezing or shortness of breath. 04/25/17   Lucious Groves, DO  albuterol (PROVENTIL) (2.5 MG/3ML) 0.083% nebulizer solution Take 3 mLs (2.5 mg total) by nebulization every 4 (four) hours as needed for wheezing or shortness of breath. 10/17/17 11/16/17  Mosetta Anis, MD  alendronate (FOSAMAX) 70 MG tablet Take 1 tablet (70 mg total) by mouth every 7 (seven) days. Take with a full glass of water on an empty stomach. 07/19/17 07/19/18  Lucious Groves, DO  amiodarone (PACERONE) 200 MG tablet TAKE 1 TABLET BY MOUTH EVERY DAY Patient taking differently: 200MG  BY MOUTH ONCE DAILY 01/29/17   Almyra Deforest, PA  aspirin EC 81 MG tablet Take 81 mg by mouth daily.    [provider]  atorvastatin (LIPITOR) 40 MG tablet Take 1 tablet (40 mg total) by mouth daily. 09/05/17   Lucious Groves, DO  benazepril (LOTENSIN) 40 MG tablet Take 1 tablet (40 mg total) by mouth daily. 04/26/17 04/26/18  Lucious Groves, DO  esomeprazole (NEXIUM) 40 MG capsule Take 1 capsule (40 mg total) daily as needed by mouth (for heartburn or indigestion). 02/06/17   Lucious Groves, DO  Fluticasone-Salmeterol (ADVAIR DISKUS) 250-50 MCG/DOSE AEPB Inhale 1 puff into the lungs 2 (two) times daily. Rinse mouth after each use 01/24/17 01/24/18  Lucious Groves, DO  gabapentin (NEURONTIN) 400 MG capsule Take 1 capsule (400 mg total) by mouth 2 (two) times daily. 07/19/17   Lucious Groves, DO  naproxen (EC NAPROSYN) 500 MG EC tablet Take 1 tablet (500 mg total) by mouth 2 (two) times daily with a meal. 10/17/17   Mosetta Anis, MD  oxyCODONE-acetaminophen (PERCOCET/ROXICET) 5-325 MG tablet Take 1 tablet by mouth daily as needed for severe pain. 10/17/17   Axel Filler, MD  potassium chloride (K-DUR,KLOR-CON) 10 MEQ tablet Take 2 tablets (20 mEq total) by mouth 2 (two) times daily. 05/31/17   Katherine Roan, MD   predniSONE (DELTASONE) 10 MG tablet Take 2 tablets x 5 days, then 1 tablet x 5 days, and then STOP 11/14/17   Rigoberto Noel, MD  rivaroxaban (XARELTO) 20 MG TABS tablet Take 1 tablet (20 mg total) by mouth every morning. 09/05/17   Lucious Groves, DO  tiotropium (SPIRIVA HANDIHALER) 18 MCG inhalation capsule Place 1 capsule (18 mcg total) into inhaler and inhale daily. 10/17/17   Mosetta Anis, MD  torsemide (DEMADEX) 20 MG tablet Take 2 tablets (40 mg total) by mouth daily. 07/19/17   Lucious Groves, DO    Family History Family History  Problem Relation Age of Onset  . Breast cancer Mother   .  Hypertension Mother   . Hyperlipidemia Mother   . Hypertension Maternal Grandfather   . Hyperlipidemia Maternal Grandfather     Social History Social History   Tobacco Use  . Smoking status: Current Some Day Smoker    Packs/day: 0.10    Years: 55.00    Pack years: 5.50    Types: Cigarettes    Start date: 08/16/1961  . Smokeless tobacco: Never Used  Substance Use Topics  . Alcohol use: No  . Drug use: No     Allergies   Patient has no known allergies.   Review of Systems Review of Systems  Constitutional: Negative for chills and fever.  HENT: Negative for ear pain and sore throat.   Eyes: Negative for pain.  Respiratory: Positive for shortness of breath (chronic). Negative for cough.   Cardiovascular: Positive for chest pain. Negative for palpitations.  Gastrointestinal: Negative for abdominal pain and vomiting.  Genitourinary: Positive for flank pain. Negative for dysuria and hematuria.  Musculoskeletal: Positive for arthralgias (patient reports that when she walks, she has shooting pain that radiates from her knees to her whole family) and back pain.  Skin: Negative for color change and rash.  Neurological: Positive for headaches. Negative for seizures and syncope.  All other systems reviewed and are negative.    Physical Exam Updated Vital Signs BP 122/68   Pulse (!) 56    Temp 98.9 F (37.2 C) (Oral)   Resp (!) 22   Ht 6\' 2"  (1.88 m)   Wt (!) 145.2 kg   SpO2 93%   BMI 41.09 kg/m   Physical Exam  Constitutional: She appears well-developed and well-nourished. No distress.  HENT:  Head: Normocephalic and atraumatic.  Eyes: Conjunctivae are normal.  Neck: Neck supple.  Cardiovascular: Normal rate and regular rhythm.  No murmur heard. Pulmonary/Chest: Effort normal and breath sounds normal. No respiratory distress.  Abdominal: Soft. There is no tenderness.  Musculoskeletal:       Right lower leg: She exhibits tenderness and edema.       Left lower leg: She exhibits tenderness and edema.  Neurological: She is alert.  Skin: Skin is warm and dry.  Psychiatric: She has a normal mood and affect.  Nursing note and vitals reviewed.    ED Treatments / Results  Labs (all labs ordered are listed, but only abnormal results are displayed) Labs Reviewed  BASIC METABOLIC PANEL - Abnormal; Notable for the following components:      Result Value   Potassium 3.3 (*)    Chloride 112 (*)    Creatinine, Ser 1.20 (*)    Calcium 8.6 (*)    GFR calc non Af Amer 45 (*)    GFR calc Af Amer 53 (*)    All other components within normal limits  CBC  I-STAT TROPONIN, ED  I-STAT TROPONIN, ED    EKG EKG Interpretation  Date/Time:  Tuesday December 04 2017 20:07:25 EDT Ventricular Rate:  73 PR Interval:  160 QRS Duration: 102 QT Interval:  458 QTC Calculation: 504 R Axis:   -30 Text Interpretation:  Normal sinus rhythm Left axis deviation Left ventricular hypertrophy with repolarization abnormality ST-t wave abnormality No significant change since last tracing Abnormal ekg Confirmed by Carmin Muskrat [phone]) on 12/04/2017 9:59:34 PM Also confirmed by Carmin Muskrat ([id number]), editor [place], Jeannetta Nap [phone])  on 12/05/2017 8:28:15 AM   Radiology Dg Chest 2 View  Result Date: 12/04/2017 CLINICAL DATA:  Chest pain with lower extremity swelling and shortness of  breath. EXAM: CHEST - 2 VIEW COMPARISON:  Chest x-ray dated November 14, 2017. FINDINGS: Stable mild cardiomegaly. Unchanged pulmonary vascular congestion and mildly coarsened interstitial markings. No focal consolidation, pleural effusion, or pneumothorax. No acute osseous abnormality. IMPRESSION: Stable chest. Unchanged pulmonary vascular congestion without overt pulmonary edema. Electronically Signed   By: Titus Dubin M.D.   On: 12/04/2017 21:17    Procedures Procedures (including critical care time)  Medications Ordered in ED Medications  albuterol (PROVENTIL) (2.5 MG/3ML) 0.083% nebulizer solution 5 mg (5 mg Nebulization Given 12/04/17 2023)  oxyCODONE-acetaminophen (PERCOCET/ROXICET) 5-325 MG per tablet 1 tablet (1 tablet Oral Given 12/04/17 2215)     Initial Impression / Assessment and Plan / ED Course  I have reviewed the triage vital signs and the nursing notes.  Pertinent labs & imaging results that were available during my care of the patient were reviewed by me and considered in my medical decision making (see chart for details).     The patient is a 67 year old female with past medical history of osteoarthritis, COPD, CHF who presents with knee pain, chest pain, shortness of breath.  Delta troponin negative.  EKG shows no STEMI.  CBC reveals no significant leukocytosis.  BMP reveals elevated creatinine 1.20, mild hypokalemia, mild hyperchloremia, and no other electrolyte abnormalities.  Chest x-ray shows interstitial edema that is unchanged from prior.  Patient reports that her symptoms have improved significantly after being given a Percocet.  The patient is able to ambulate and reports that she has no chest pain or shortness of breath.  She reports that her knee pain is also significantly improved.  Patient care supervised by Dr. Vanita Panda.  Irven Baltimore, MD  Final Clinical Impressions(s) / ED Diagnoses   Final diagnoses:  Peripheral edema  Leg swelling  Other form of  dyspnea  Chest pain, unspecified type    ED Discharge Orders    None       Irven Baltimore, MD 12/05/17 1339    Carmin Muskrat, MD 12/05/17 2337

## 2017-12-05 LAB — I-STAT TROPONIN, ED: Troponin i, poc: 0 ng/mL (ref 0.00–0.08)

## 2017-12-05 NOTE — ED Notes (Signed)
Pt. Ambulated to bathroom with walker. Pt. Walked with steady gait and expressed relief from medication.

## 2017-12-18 ENCOUNTER — Encounter: Payer: Self-pay | Admitting: *Deleted

## 2017-12-19 ENCOUNTER — Ambulatory Visit (INDEPENDENT_AMBULATORY_CARE_PROVIDER_SITE_OTHER): Payer: Medicare Other | Admitting: Primary Care

## 2017-12-19 ENCOUNTER — Ambulatory Visit (INDEPENDENT_AMBULATORY_CARE_PROVIDER_SITE_OTHER): Payer: Medicare Other | Admitting: Pulmonary Disease

## 2017-12-19 ENCOUNTER — Encounter: Payer: Self-pay | Admitting: Primary Care

## 2017-12-19 VITALS — BP 152/96 | HR 61 | Ht 71.0 in | Wt 310.0 lb

## 2017-12-19 DIAGNOSIS — Z79899 Other long term (current) drug therapy: Secondary | ICD-10-CM | POA: Insufficient documentation

## 2017-12-19 DIAGNOSIS — R0689 Other abnormalities of breathing: Secondary | ICD-10-CM

## 2017-12-19 DIAGNOSIS — I5032 Chronic diastolic (congestive) heart failure: Secondary | ICD-10-CM | POA: Diagnosis not present

## 2017-12-19 DIAGNOSIS — J449 Chronic obstructive pulmonary disease, unspecified: Secondary | ICD-10-CM

## 2017-12-19 DIAGNOSIS — R0600 Dyspnea, unspecified: Secondary | ICD-10-CM

## 2017-12-19 DIAGNOSIS — R062 Wheezing: Secondary | ICD-10-CM | POA: Diagnosis not present

## 2017-12-19 DIAGNOSIS — G4733 Obstructive sleep apnea (adult) (pediatric): Secondary | ICD-10-CM

## 2017-12-19 DIAGNOSIS — Z86711 Personal history of pulmonary embolism: Secondary | ICD-10-CM

## 2017-12-19 DIAGNOSIS — R6889 Other general symptoms and signs: Secondary | ICD-10-CM | POA: Diagnosis not present

## 2017-12-19 DIAGNOSIS — I480 Paroxysmal atrial fibrillation: Secondary | ICD-10-CM

## 2017-12-19 DIAGNOSIS — Z720 Tobacco use: Secondary | ICD-10-CM

## 2017-12-19 LAB — PULMONARY FUNCTION TEST
DL/VA % pred: 77 %
DL/VA: 4.29 ml/min/mmHg/L
DLCO cor % pred: 48 %
DLCO cor: 16.45 ml/min/mmHg
DLCO unc % pred: 49 %
DLCO unc: 16.6 ml/min/mmHg
FEF 25-75 Post: 2.89 L/sec
FEF 25-75 Pre: 2.99 L/sec
FEF2575-%Change-Post: -3 %
FEF2575-%Pred-Post: 126 %
FEF2575-%Pred-Pre: 131 %
FEV1-%Change-Post: 0 %
FEV1-%Pred-Post: 75 %
FEV1-%Pred-Pre: 76 %
FEV1-Post: 1.93 L
FEV1-Pre: 1.95 L
FEV1FVC-%Change-Post: 2 %
FEV1FVC-%Pred-Pre: 112 %
FEV6-%Change-Post: -2 %
FEV6-%Pred-Post: 68 %
FEV6-%Pred-Pre: 69 %
FEV6-Post: 2.16 L
FEV6-Pre: 2.2 L
FEV6FVC-%Change-Post: 0 %
FEV6FVC-%Pred-Post: 103 %
FEV6FVC-%Pred-Pre: 102 %
FVC-%Change-Post: -3 %
FVC-%Pred-Post: 66 %
FVC-%Pred-Pre: 68 %
FVC-Post: 2.16 L
FVC-Pre: 2.23 L
Post FEV1/FVC ratio: 89 %
Post FEV6/FVC ratio: 100 %
Pre FEV1/FVC ratio: 87 %
Pre FEV6/FVC Ratio: 100 %
RV % pred: 56 %
RV: 1.43 L
TLC % pred: 58 %
TLC: 3.57 L

## 2017-12-19 LAB — POCT EXHALED NITRIC OXIDE: FeNO level (ppb): 5

## 2017-12-19 MED ORDER — TIOTROPIUM BROMIDE MONOHYDRATE 18 MCG IN CAPS: 18.0000 ug | ORAL_CAPSULE | Freq: Every day | RESPIRATORY_TRACT | 6 refills | Status: DC

## 2017-12-19 MED ORDER — FLUTICASONE-SALMETEROL 250-50 MCG/DOSE IN AEPB: 1.0000 | INHALATION_SPRAY | Freq: Two times a day (BID) | RESPIRATORY_TRACT | 6 refills | Status: DC

## 2017-12-19 NOTE — Progress Notes (Signed)
PFT done today. 

## 2017-12-19 NOTE — Assessment & Plan Note (Signed)
-   DLCOcor 16.45 (48% predicted) - Plan check HRCT

## 2017-12-19 NOTE — Assessment & Plan Note (Signed)
-   Scheduled to complete HST tonight  - FU in 4-6 weeks

## 2017-12-19 NOTE — Assessment & Plan Note (Signed)
-   Anticoagulated with Xarelto, patient states compliance with medication

## 2017-12-19 NOTE — Progress Notes (Signed)
@Patient  ID: Sharon Vasquez, female    DOB: 10-Oct-1950, 67 y.o.   [name]: [id number]  Chief Complaint  Patient presents with  . Follow-up    sleep study tonight-PFT-anxiety and SOB    Referring provider: Lucious Groves, DO  HPI: 67 year old female, current some day smoker. PMH COPD, OSA, laryngopharyngeal reflux. New patient of Dr. Bari Mantis, initial consult completed on 11/14/17. Referred by Dr. Kristie Cowman for COPD and SOB. Patient gets sob with any activity. Maintained on Advair, Spiriva and PRN albuteol neb. No reports of cough.   Multiple co-morbidities including HTN, paroxysmal afib (amiodarone), PE/ LLE DVT(on Xarelto), heart failure with preserved ejection fraction. Last cardiology eval on 08/2016. Chronic pedal edema, wears compression stockings.   11/14/17 - OV Dr. Elsworth Soho - Initial consult for ?COPD and sob   - Treated with 10 day Prednisone taper and prn Albuterol neb q6hrs - Scheduled for HST for suspected OSA - Ordered for CXR, PFTs and DLCO d/t chronic amiodarone use - Referred for FU with cardiology   12/19/2017 Presents today for 1 month follow-up visit with PFTs. She is scheduled to have home sleep test tonight. Patient states that she is doing ok, completed prednisone taper. Continues Adviar and Spiriva as prescribed. Reports that she has been having panic attacks where she feels like she can't breath. Thinks she is holding onto extra fluid and has taken additional Torsemide with improvement in dyspnea. On chronic amiodarone. She does not have an apt with cardiology. BP elevated but she hasn't taken her medication today.   Significant testing reviewed: >> ECHO 07/2016 - EF 60-65%, PA peak pressure 29mm Hg >> CXR 11/14/17- Chronic cardiomegaly and aortic atherosclerosis. Pulmonary venous hypertension. Question early/minimal interstitial edema versus chronic interstitial markings. No advanced finding. >> CXR 12/04/17- Stable mild cardiomegaly. Unchanged pulmonary vascular  congestion and mildly coarsened interstitial markings. No focal consolidation, pleural effusion, or pneumothorax  >> FENO 12/19/2017 - 5    No Known Allergies  Immunization History  Administered Date(s) Administered  . Pneumococcal Conjugate-13 02/01/2017    Past Medical History:  Diagnosis Date  . A-fib ([place])   . Anxiety   . Arthritis    "qwhre; joints, back" (04/17/2017)  . Benign breast cyst in female, left 01/08/2017   Found by Screening mammogram, evaluated by U/S on 01/08/17 and determined to be a benign simple breast cyst.  . Cellulitis of left lower leg 05/30/2017  . CHF (congestive heart failure) ([place])   . Chronic lower back pain   . Chronic venous insufficiency    [name] 05/30/2017  . COPD (chronic obstructive pulmonary disease) ([place])   . Depression   . DVT (deep venous thrombosis) ([place]) 11/16/2016  . GERD (gastroesophageal reflux disease)   . Headache    "weekly for the last 3 months" (04/17/2017)  . Hyperlipidemia   . Hypertension   . Morbid obesity ([place])   . PE (pulmonary embolism)   . Pulmonary embolism ([place]) 09/21/2014    Tobacco History: Social History   Tobacco Use  Smoking Status Current Some Day Smoker  . Packs/day: 0.10  . Years: 55.00  . Pack years: 5.50  . Types: Cigarettes  . Start date: 08/16/1961  Smokeless Tobacco Never Used   Ready to quit: Not Answered Counseling given: Not Answered   Outpatient Medications Prior to Visit  Medication Sig Dispense Refill  . albuterol (PROAIR HFA) 108 (90 Base) MCG/ACT inhaler Inhale 2 puffs into the lungs every 6 (six) hours as needed for  wheezing or shortness of breath. 1 Inhaler 1  . aspirin EC 81 MG tablet Take 81 mg by mouth daily.    [name] atorvastatin (LIPITOR) 40 MG tablet Take 1 tablet (40 mg total) by mouth daily. 30 tablet 3  . benazepril (LOTENSIN) 40 MG tablet Take 1 tablet (40 mg total) by mouth daily. 90 tablet 3  . gabapentin (NEURONTIN) 400 MG capsule Take 1 capsule (400 mg total) by mouth 2  (two) times daily. 60 capsule 2  . potassium chloride (K-DUR,KLOR-CON) 10 MEQ tablet Take 2 tablets (20 mEq total) by mouth 2 (two) times daily. 60 tablet 2  . rivaroxaban (XARELTO) 20 MG TABS tablet Take 1 tablet (20 mg total) by mouth every morning. 30 tablet 5  . torsemide (DEMADEX) 20 MG tablet Take 2 tablets (40 mg total) by mouth daily. 60 tablet 2  . Fluticasone-Salmeterol (ADVAIR DISKUS) 250-50 MCG/DOSE AEPB Inhale 1 puff into the lungs 2 (two) times daily. Rinse mouth after each use 60 each 11  . tiotropium (SPIRIVA HANDIHALER) 18 MCG inhalation capsule Place 1 capsule (18 mcg total) into inhaler and inhale daily. 30 capsule 11  . albuterol (PROVENTIL) (2.5 MG/3ML) 0.083% nebulizer solution Take 3 mLs (2.5 mg total) by nebulization every 4 (four) hours as needed for wheezing or shortness of breath. 75 mL 2  . alendronate (FOSAMAX) 70 MG tablet Take 1 tablet (70 mg total) by mouth every 7 (seven) days. Take with a full glass of water on an empty stomach. (Patient not taking: Reported on 12/19/2017) 12 tablet 3  . amiodarone (PACERONE) 200 MG tablet TAKE 1 TABLET BY MOUTH EVERY DAY (Patient taking differently: 200MG  BY MOUTH ONCE DAILY) 90 tablet 1  . esomeprazole (NEXIUM) 40 MG capsule Take 1 capsule (40 mg total) daily as needed by mouth (for heartburn or indigestion). (Patient not taking: Reported on 12/19/2017) 30 capsule 2  . naproxen (EC NAPROSYN) 500 MG EC tablet Take 1 tablet (500 mg total) by mouth 2 (two) times daily with a meal. (Patient not taking: Reported on 12/19/2017) 60 tablet 3  . oxyCODONE-acetaminophen (PERCOCET/ROXICET) 5-325 MG tablet Take 1 tablet by mouth daily as needed for severe pain. (Patient not taking: Reported on 12/19/2017) 30 tablet 0  . predniSONE (DELTASONE) 10 MG tablet Take 2 tablets x 5 days, then 1 tablet x 5 days, and then STOP 15 tablet 0   No facility-administered medications prior to visit.     Review of Systems  Review of Systems  Constitutional:  Negative.   HENT: Negative.   Respiratory: Positive for shortness of breath. Negative for cough, chest tightness and wheezing.   Cardiovascular: Positive for leg swelling. Negative for chest pain.    Physical Exam  BP (!) 152/96 (BP Location: Left Arm, Cuff Size: Normal)   Pulse 61   Ht 5\' 11"  (1.803 m)   Wt (!) 310 lb (140.6 kg)   SpO2 97%   BMI 43.24 kg/m  Physical Exam  Constitutional: She is oriented to person, place, and time. She appears well-developed and well-nourished. No distress.  HENT:  Head: Normocephalic and atraumatic.  Eyes: Pupils are equal, round, and reactive to light. EOM are normal.  Neck: Normal range of motion. Neck supple.  Cardiovascular: Normal rate and regular rhythm.  Regular rhythm. +3 BLE edema, R>L  Pulmonary/Chest: Effort normal. She has wheezes.  Musculoskeletal:  Amb with rolling walker Bilateral knee pain   Neurological: She is alert and oriented to person, place, and time.  Skin: Skin  is warm and dry.  Psychiatric: She has a normal mood and affect. Her behavior is normal. Judgment and thought content normal.  Anxious     Lab Results:  CBC    Component Value Date/Time   WBC 6.0 12/04/2017 2027   RBC 4.71 12/04/2017 2027   HGB 13.7 12/04/2017 2027   HCT 42.2 12/04/2017 2027   PLT 370 12/04/2017 2027   MCV 89.6 12/04/2017 2027   MCH 29.1 12/04/2017 2027   MCHC 32.5 12/04/2017 2027   RDW 14.0 12/04/2017 2027   LYMPHSABS 0.7 05/24/2017 1041   MONOABS 0.3 05/24/2017 1041   EOSABS 0.0 05/24/2017 1041   BASOSABS 0.0 05/24/2017 1041    BMET    Component Value Date/Time   NA 145 12/04/2017 2027   NA 141 07/19/2017 1017   K 3.3 (L) 12/04/2017 2027   CL 112 (H) 12/04/2017 2027   CO2 25 12/04/2017 2027   GLUCOSE 96 12/04/2017 2027   BUN 13 12/04/2017 2027   BUN 19 07/19/2017 1017   CREATININE 1.20 (H) 12/04/2017 2027   CREATININE 1.21 (H) 07/26/2016 1528   CALCIUM 8.6 (L) 12/04/2017 2027   GFRNONAA 45 (L) 12/04/2017 2027    GFRAA 53 (L) 12/04/2017 2027    BNP    Component Value Date/Time   BNP 118.0 (H) 04/17/2017 0815    ProBNP No results found for: PROBNP  Imaging: Dg Chest 2 View  Result Date: 12/04/2017 CLINICAL DATA:  Chest pain with lower extremity swelling and shortness of breath. EXAM: CHEST - 2 VIEW COMPARISON:  Chest x-ray dated November 14, 2017. FINDINGS: Stable mild cardiomegaly. Unchanged pulmonary vascular congestion and mildly coarsened interstitial markings. No focal consolidation, pleural effusion, or pneumothorax. No acute osseous abnormality. IMPRESSION: Stable chest. Unchanged pulmonary vascular congestion without overt pulmonary edema. Electronically Signed   By: Titus Dubin M.D.   On: 12/04/2017 21:17     Assessment & Plan:    OSA (obstructive sleep apnea) - Scheduled to complete HST tonight  - FU in 4-6 weeks   COPD (chronic obstructive pulmonary disease) ([facility]) PFTs 12/19/2017 - No obstruction, mild-mod restriction, decreased DLCO  FVC 2.16 (66%), FEV1 1.93 (75%), ratio 89, DLCOcor 16.45 (48%)  Sob likely multifactorial. Per Dr. Elsworth Soho, she did appear to have mild exacerbation with bronchospasm in August. Treated with prednisone taper. Dyspnea could also be r/t chronic diastolic heart failure. Amiodarone toxicity is also possible.    History of pulmonary embolism - Anticoagulated with Xarelto, patient states compliance with medication   Tobacco use - Continues to smoke, states that she has cut back - Smoking cessation encouraged   PAF (paroxysmal atrial fibrillation) (H[facility]) - Regular rate and rhythm - Chronic amiodarone use - Needs fu with cardiology   (HFpEF) heart failure with preserved ejection fraction ([place]) - Continues Torsemide - Checking BNP/BMET - Needs referral to cardiology, recommend apt in 2-4 weeks   Long term current use of amiodarone - DLCOcor 16.45 (48% predicted) - Plan check HRCT      Martyn Ehrich, NP 12/19/2017

## 2017-12-19 NOTE — Assessment & Plan Note (Addendum)
PFTs 12/19/2017 - No obstruction, mild-mod restriction, decreased DLCO  FVC 2.16 (66%), FEV1 1.93 (75%), ratio 89, DLCOcor 16.45 (48%)  Sob likely multifactorial. Per Dr. Elsworth Soho, she did appear to have mild exacerbation with bronchospasm in August. Treated with prednisone taper. Dyspnea could also be r/t chronic diastolic heart failure. Amiodarone toxicity is also possible.

## 2017-12-19 NOTE — Assessment & Plan Note (Signed)
-   Continues to smoke, states that she has cut back - Smoking cessation encouraged

## 2017-12-19 NOTE — Assessment & Plan Note (Signed)
-   Regular rate and rhythm - Chronic amiodarone use - Needs fu with cardiology

## 2017-12-19 NOTE — Assessment & Plan Note (Signed)
-   Continues Torsemide - Checking BNP/BMET - Needs referral to cardiology, recommend apt in 2-4 weeks

## 2017-12-19 NOTE — Patient Instructions (Addendum)
Refer to [facility] health cardiology re: Afib, diastolic HF  (needs an apt in the next 2-4 weeks please)  Needs HRCT with ILD protocol Re: dyspnea, chronic amiodarone use   Labs today   FU in 6-8 weeks with Dr. Elsworth Soho please

## 2017-12-19 NOTE — Assessment & Plan Note (Signed)
>>  ASSESSMENT AND PLAN FOR DIASTOLIC HEART FAILURE (HCC) WRITTEN ON 12/19/2017  2:13 PM BY WALSH, Earnstine Regal, NP  - Continues Torsemide - Checking BNP/BMET - Needs referral to cardiology, recommend apt in 2-4 weeks

## 2017-12-20 DIAGNOSIS — G4733 Obstructive sleep apnea (adult) (pediatric): Secondary | ICD-10-CM

## 2017-12-21 ENCOUNTER — Other Ambulatory Visit: Payer: Self-pay | Admitting: *Deleted

## 2017-12-21 DIAGNOSIS — G4733 Obstructive sleep apnea (adult) (pediatric): Secondary | ICD-10-CM | POA: Diagnosis not present

## 2017-12-24 DIAGNOSIS — M170 Bilateral primary osteoarthritis of knee: Secondary | ICD-10-CM | POA: Diagnosis not present

## 2017-12-25 ENCOUNTER — Other Ambulatory Visit: Payer: Self-pay | Admitting: Internal Medicine

## 2017-12-25 DIAGNOSIS — Z1231 Encounter for screening mammogram for malignant neoplasm of breast: Secondary | ICD-10-CM

## 2017-12-28 ENCOUNTER — Telehealth: Payer: Self-pay | Admitting: Pulmonary Disease

## 2017-12-28 DIAGNOSIS — G4733 Obstructive sleep apnea (adult) (pediatric): Secondary | ICD-10-CM

## 2017-12-28 NOTE — Telephone Encounter (Signed)
Per RA, HST showed moderate OSA with 20 events per hour. He suggests a CPAP titration study as the next step. Will order this once the patient is aware.

## 2017-12-31 ENCOUNTER — Telehealth: Payer: Self-pay | Admitting: Primary Care

## 2017-12-31 ENCOUNTER — Ambulatory Visit (INDEPENDENT_AMBULATORY_CARE_PROVIDER_SITE_OTHER)
Admission: RE | Admit: 2017-12-31 | Discharge: 2017-12-31 | Disposition: A | Discharge status: Home / Self Care | Payer: Medicare Other | Source: Ambulatory Visit | Attending: Primary Care | Admitting: Primary Care

## 2017-12-31 DIAGNOSIS — R0689 Other abnormalities of breathing: Secondary | ICD-10-CM | POA: Diagnosis not present

## 2017-12-31 DIAGNOSIS — J439 Emphysema, unspecified: Secondary | ICD-10-CM | POA: Diagnosis not present

## 2017-12-31 DIAGNOSIS — R0600 Dyspnea, unspecified: Secondary | ICD-10-CM | POA: Diagnosis not present

## 2017-12-31 DIAGNOSIS — R6889 Other general symptoms and signs: Secondary | ICD-10-CM | POA: Diagnosis not present

## 2017-12-31 NOTE — Telephone Encounter (Signed)
Please review results of HRCT findings with patient and make sure she has a cardiology apt set up. We can discuss more during her next OV with Dr. Elsworth Soho in November. Thanks

## 2017-12-31 NOTE — Telephone Encounter (Signed)
Could this be related to amiodarone, has not been seen cardiology since 08/2016, needs to go back to them  Please let patient know and make cardiology office follow-up

## 2017-12-31 NOTE — Telephone Encounter (Signed)
Dr. Elsworth Soho,  Patient of yours with hx of diastolic heart failure, ?COPD, afib on chronic amiodarone. I saw her for follow-up PFTs, low DLCOcor 16.45 (48%). Continues Torsemide. Still needs to follow-up with cardiology.   HRCT showed the following: Mild interlobular septal thickening and mild patchy peribronchovascular ground-glass opacity in both lungs. Minimal patchy subpleural reticulation in the upper lobes.  Given the cardiomegaly, findings could represent mild pulmonary edema. Alternatively, this spectrum of findings could be indicative of respiratory bronchiolitis-interstitial lung disease (RB-ILD) in this symptomatic current smoker.  She has a follow-up with you in November

## 2018-01-01 NOTE — Telephone Encounter (Signed)
Sharon Vasquez, please advise what you would like [place] to let the patient know about her HRCT results, we will call the patient to advised her of them once we receive your response, thank you!

## 2018-01-01 NOTE — Telephone Encounter (Signed)
HRCT showed mild patchy peribronchovascular ground-glass opacity in both lungs. This could represent mild pulmonary edema or respiratory bronchiolitis - interstitial lung disease. It also showed cardiomegaly, arteriosclerosis and enlargement of ascending thoracic aorta (recommend annual CTA to follow this). She needs to follow up with cardiology, some of these findings could be from chronic amiodarone use.

## 2018-01-02 ENCOUNTER — Other Ambulatory Visit: Payer: Self-pay | Admitting: Physician Assistant

## 2018-01-02 ENCOUNTER — Other Ambulatory Visit: Payer: Self-pay

## 2018-01-02 ENCOUNTER — Ambulatory Visit (INDEPENDENT_AMBULATORY_CARE_PROVIDER_SITE_OTHER): Payer: Medicare Other | Admitting: Physician Assistant

## 2018-01-02 ENCOUNTER — Encounter: Payer: Self-pay | Admitting: Physician Assistant

## 2018-01-02 VITALS — BP 184/103 | HR 69 | Ht 71.0 in | Wt 339.8 lb

## 2018-01-02 DIAGNOSIS — M79601 Pain in right arm: Secondary | ICD-10-CM

## 2018-01-02 DIAGNOSIS — I480 Paroxysmal atrial fibrillation: Secondary | ICD-10-CM | POA: Diagnosis not present

## 2018-01-02 DIAGNOSIS — I739 Peripheral vascular disease, unspecified: Secondary | ICD-10-CM | POA: Diagnosis not present

## 2018-01-02 DIAGNOSIS — E785 Hyperlipidemia, unspecified: Secondary | ICD-10-CM

## 2018-01-02 DIAGNOSIS — I5033 Acute on chronic diastolic (congestive) heart failure: Secondary | ICD-10-CM | POA: Diagnosis not present

## 2018-01-02 DIAGNOSIS — I4819 Other persistent atrial fibrillation: Secondary | ICD-10-CM | POA: Diagnosis not present

## 2018-01-02 DIAGNOSIS — R6889 Other general symptoms and signs: Secondary | ICD-10-CM | POA: Diagnosis not present

## 2018-01-02 DIAGNOSIS — I10 Essential (primary) hypertension: Secondary | ICD-10-CM

## 2018-01-02 DIAGNOSIS — J449 Chronic obstructive pulmonary disease, unspecified: Secondary | ICD-10-CM

## 2018-01-02 MED ORDER — HYDRALAZINE HCL 25 MG PO TABS: 25.0000 mg | ORAL_TABLET | Freq: Three times a day (TID) | ORAL | 11 refills | Status: DC

## 2018-01-02 MED ORDER — POTASSIUM CHLORIDE CRYS ER 10 MEQ PO TBCR: 20.0000 meq | EXTENDED_RELEASE_TABLET | Freq: Two times a day (BID) | ORAL | 11 refills | Status: DC

## 2018-01-02 MED ORDER — ESOMEPRAZOLE MAGNESIUM 40 MG PO CPDR: 40.0000 mg | DELAYED_RELEASE_CAPSULE | Freq: Every day | ORAL | 11 refills | Status: DC | PRN

## 2018-01-02 MED ORDER — RIVAROXABAN 20 MG PO TABS: 20.0000 mg | ORAL_TABLET | ORAL | 5 refills | Status: DC

## 2018-01-02 MED ORDER — TORSEMIDE 20 MG PO TABS: 40.0000 mg | ORAL_TABLET | Freq: Every day | ORAL | 11 refills | Status: DC

## 2018-01-02 MED ORDER — AMIODARONE HCL 100 MG PO TABS: 100.0000 mg | ORAL_TABLET | Freq: Every day | ORAL | 11 refills | Status: DC

## 2018-01-02 NOTE — Patient Outreach (Signed)
[facility]) Care Management  01/02/2018  Sharon Vasquez 1950-08-29 [id number]   Medication Adherence call to Sharon Vasquez spoke with patient she is no longer taking Benazepril 40 mg she explain she has been on a sliding scale and now she is taking a different medication. Sharon Vasquez is past due under [place].   [place] Management Direct Dial [phone]  Fax [phone] [email or web address]

## 2018-01-02 NOTE — Patient Instructions (Addendum)
Medication Instructions: Almyra Deforest, PA has recommended making the following medication changes: 1. DECREASE Amiodarone to 100 mg daily 2. RESTART Torsemide 40 mg daily 3. START Hydralazine 25 mg THREE times daily  Labwork: NONE ORDERED  Testing/Procedures: 1. Carotid Duplex - Your physician has requested that you have a carotid duplex. This test is an ultrasound of the carotid arteries in your neck. It looks at blood flow through these arteries that supply the brain with blood. Allow one hour for this exam. There are no restrictions or special instructions.  Follow-up: Isaac Laud recommends that you schedule a follow-up appointment in 1-2 months with Dr Gwenlyn Found.  If you need a refill on your cardiac medications before your next appointment, please call your pharmacy.

## 2018-01-02 NOTE — Progress Notes (Signed)
Cardiology Office Note    Date:  01/04/2018   ID:  Sharon Vasquez, DOB 1950/09/02, [name] [id number]  PCP:  Lucious Groves, DO  Cardiologist:  Dr. Gwenlyn Found  Chief Complaint  Patient presents with  . Follow-up    pt denied chest pain    History of Present Illness:  Sharon Vasquez is a 67 y.o. female with PMH of HTN, HLD, morbid obesity, h/o PE, PAF on Xarelto, COPD, and diastolic heart failure. She presented to the hospital in 2018 with dyspnea and found to be in atrial fibrillation and acute diastolic heart failure. She was also treated for COPD exacerbation in the lower extremity cellulitis. She has prior history of admission in [place] for diastolic heart failure. She has a history of unprovocked pulmonary embolism and left lower extremity DVT in [place] in June 2016. She had a history of paroxysmal atrial fibrillation during the previous admission in [place]. She was placed on amiodarone and Xarelto at that time, amiodarone was later discontinued. She was seen by cardiology service during the recent hospitalization, who suspected this is a combination of acute on chronic diastolic heart failure and also COPD exacerbation. She was compliant with Xarelto at that time.  Patient presents today for cardiology office visit.  She needs refill on multiple medications.  She also gained about 20 pounds recently.  She has only taken the torsemide twice in the past 2 weeks.  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  I encouraged her to restart on the torsemide.  Her lung is clear, majority of her swelling is in the lower extremity.  She has been asked to see cardiology service mainly because of recent high-resolution CT of the chest.  The high-resolution CT was concerning for either pulmonary edema versus possible interstitial lung disease.  Since she is on amiodarone, there was some concern of amiodarone induced toxicity.  I will  decrease her amiodarone to 100 mg daily.  She will need to see Dr. Alvester Chou in 1 to 2 months, potentially will need to consider whether to stop the amiodarone and switch to bisoprolol.  She is currently not on any beta-blocker therapy.  Her blood pressure is high however there is a discrepancy between the left and right side.  The right arm is 30 mmHg higher than the left side.  She is also complaining of occasional right arm pain.  She has a very good right radial pulse, I will obtain a carotid ultrasound was bilateral subclavian to make sure there is no significant subclavian artery stenosis.  I think some of her right arm pain still may be related to pinched nerve in the cervical area.  Her blood pressure is elevated, I will add on 25 mg 3 times daily dosing of hydralazine.  Otherwise she denies any significant chest discomfort.   Past Medical History:  Diagnosis Date  . A-fib ([place])   . Anxiety   . Arthritis    "qwhre; joints, back" (04/17/2017)  . Benign breast cyst in female, left 01/08/2017   Found by Screening mammogram, evaluated by U/S on 01/08/17 and determined to be a benign simple breast cyst.  . Cellulitis of left lower leg 05/30/2017  . CHF (congestive heart failure) ([place])   . Chronic lower back pain   . Chronic venous insufficiency    Sharon Vasquez 05/30/2017  . COPD (chronic obstructive pulmonary disease) ([place])   . Depression   .  DVT (deep venous thrombosis) ([place]) 11/16/2016  . GERD (gastroesophageal reflux disease)   . Headache    "weekly for the last 3 months" (04/17/2017)  . Hyperlipidemia   . Hypertension   . Morbid obesity ([place])   . PE (pulmonary embolism)   . Pulmonary embolism ([place]) 09/21/2014    Past Surgical History:  Procedure Laterality Date  . ABDOMINAL HYSTERECTOMY    . APPENDECTOMY    . BREAST CYST EXCISION Left    "six o'clock"  . BREAST LUMPECTOMY Left   . CHOLECYSTECTOMY    . DILATION AND CURETTAGE OF UTERUS    . TONSILLECTOMY AND ADENOIDECTOMY    . TUBAL  LIGATION      Current Medications: Outpatient Medications Prior to Visit  Medication Sig Dispense Refill  . albuterol (PROAIR HFA) 108 (90 Base) MCG/ACT inhaler Inhale 2 puffs into the lungs every 6 (six) hours as needed for wheezing or shortness of breath. 1 Inhaler 1  . albuterol (PROVENTIL) (2.5 MG/3ML) 0.083% nebulizer solution Take 3 mLs (2.5 mg total) by nebulization every 4 (four) hours as needed for wheezing or shortness of breath. 75 mL 2  . alendronate (FOSAMAX) 70 MG tablet Take 1 tablet (70 mg total) by mouth every 7 (seven) days. Take with a full glass of water on an empty stomach. 12 tablet 3  . aspirin EC 81 MG tablet Take 81 mg by mouth daily.    [name] atorvastatin (LIPITOR) 40 MG tablet Take 1 tablet (40 mg total) by mouth daily. 30 tablet 3  . benazepril (LOTENSIN) 40 MG tablet Take 1 tablet (40 mg total) by mouth daily. 90 tablet 3  . Fluticasone-Salmeterol (ADVAIR DISKUS) 250-50 MCG/DOSE AEPB Inhale 1 puff into the lungs 2 (two) times daily. Rinse mouth after each use 60 each 6  . gabapentin (NEURONTIN) 400 MG capsule Take 1 capsule (400 mg total) by mouth 2 (two) times daily. 60 capsule 2  . naproxen (EC NAPROSYN) 500 MG EC tablet Take 1 tablet (500 mg total) by mouth 2 (two) times daily with a meal. 60 tablet 3  . oxyCODONE-acetaminophen (PERCOCET/ROXICET) 5-325 MG tablet Take 1 tablet by mouth daily as needed for severe pain. 30 tablet 0  . tiotropium (SPIRIVA HANDIHALER) 18 MCG inhalation capsule Place 1 capsule (18 mcg total) into inhaler and inhale daily. 30 capsule 6  . amiodarone (PACERONE) 200 MG tablet TAKE 1 TABLET BY MOUTH EVERY DAY (Patient taking differently: 200MG  BY MOUTH ONCE DAILY) 90 tablet 1  . esomeprazole (NEXIUM) 40 MG capsule Take 1 capsule (40 mg total) daily as needed by mouth (for heartburn or indigestion). 30 capsule 2  . potassium chloride (K-DUR,KLOR-CON) 10 MEQ tablet Take 2 tablets (20 mEq total) by mouth 2 (two) times daily. 60 tablet 2  .  rivaroxaban (XARELTO) 20 MG TABS tablet Take 1 tablet (20 mg total) by mouth every morning. 30 tablet 5  . torsemide (DEMADEX) 20 MG tablet Take 2 tablets (40 mg total) by mouth daily. 60 tablet 2   No facility-administered medications prior to visit.      Allergies:   Patient has no known allergies.   Social History   Socioeconomic History  . Marital status: Divorced    Spouse name: Not on file  . Number of children: Not on file  . Years of education: Not on file  . Highest education level: Not on file  Occupational History  . Not on file  Social Needs  . Financial resource strain: Not on file  .  Food insecurity:    Worry: Not on file    Inability: Not on file  . Transportation needs:    Medical: Not on file    Non-medical: Not on file  Tobacco Use  . Smoking status: Current Some Day Smoker    Packs/day: 0.10    Years: 55.00    Pack years: 5.50    Types: Cigarettes    Start date: 08/16/1961  . Smokeless tobacco: Never Used  Substance and Sexual Activity  . Alcohol use: No  . Drug use: No  . Sexual activity: Not Currently    Partners: Male  Lifestyle  . Physical activity:    Days per week: Not on file    Minutes per session: Not on file  . Stress: Not on file  Relationships  . Social connections:    Talks on phone: Not on file    Gets together: Not on file    Attends religious service: Not on file    Active member of club or organization: Not on file    Attends meetings of clubs or organizations: Not on file    Relationship status: Not on file  Other Topics Concern  . Not on file  Social History Narrative   Lives in [facility] in [place]. Lives alone, but is dependent in ADLs/IADLs. 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 Two daughters live nearby. Previously resided in [place].     Family History:  The patient's family history includes Breast cancer in her mother; Hyperlipidemia in her maternal grandfather  and mother; Hypertension in her maternal grandfather and mother.   ROS:   Please see the history of present illness.    ROS All other systems reviewed and are negative.   PHYSICAL EXAM:   VS:  BP (!) 184/103 (BP Location: Right Arm, Patient Position: Sitting, Cuff Size: Large)   Pulse 69   Ht 5\' 11"  (1.803 m)   Wt (!) 339 lb 12.8 oz (154.1 kg)   BMI 47.39 kg/m    GEN: Well nourished, well developed, in no acute distress  HEENT: normal  Neck: no JVD, carotid bruits, or masses Cardiac: RRR; no murmurs, rubs, or gallops,no edema  Respiratory:  clear to auscultation bilaterally, normal work of breathing GI: soft, nontender, nondistended, + BS MS: no deformity or atrophy  Skin: warm and dry, no rash Neuro:  Alert and Oriented x 3, Strength and sensation are intact Psych: euthymic mood, full affect  Wt Readings from Last 3 Encounters:  01/02/18 (!) 339 lb 12.8 oz (154.1 kg)  12/19/17 (!) 310 lb (140.6 kg)  12/04/17 (!) 320 lb (145.2 kg)      Studies/Labs Reviewed:   EKG:  EKG is ordered today.  The ekg ordered today demonstrates NSR with TWI in lateral leads  Recent Labs: 04/17/2017: B Natriuretic Peptide 118.0 05/24/2017: ALT 13 05/30/2017: Magnesium 1.7; TSH 1.609 12/04/2017: BUN 13; Creatinine, Ser 1.20; Hemoglobin 13.7; Platelets 370; Potassium 3.3; Sodium 145   Lipid Panel    Component Value Date/Time   CHOL 154 07/19/2017 1017   TRIG 99 07/19/2017 1017   HDL 48 07/19/2017 1017   CHOLHDL 3.2 07/19/2017 1017   LDLCALC 86 07/19/2017 1017    Additional studies/ records that were reviewed today include:   Echo 07/02/2016 LV EF: 60% -   65% Study Conclusions  - Left ventricle: The cavity size was normal. There was mild   concentric hypertrophy. Systolic function was  normal. The   estimated ejection fraction was in the range of 60% to 65%. Wall   motion was normal; there were no regional wall motion   abnormalities. - Aortic valve: Transvalvular velocity was within  the normal range.   There was no stenosis. There was no regurgitation. - Mitral valve: Transvalvular velocity was within the normal range.   There was no evidence for stenosis. There was trivial   regurgitation. - Left atrium: The atrium was moderately dilated. - Right ventricle: The cavity size was normal. Wall thickness was   normal. Systolic function was normal. - Atrial septum: No defect or patent foramen ovale was identified   by color flow Doppler. - Tricuspid valve: There was mild regurgitation. - Pulmonary arteries: Systolic pressure was within the normal   range. PA peak pressure: 29 mm Hg (S).    CT chest high resolution 12/31/2017 IMPRESSION: 1. Borderline mild cardiomegaly. Left main and 3 vessel coronary atherosclerosis. 2. Dilated main pulmonary artery, unchanged, suggestive of chronic pulmonary arterial hypertension. 3. Mild interlobular septal thickening and mild patchy peribronchovascular ground-glass opacity in both lungs. Minimal patchy subpleural reticulation in the upper lobes. Given the cardiomegaly, findings could represent mild pulmonary edema. Alternatively, this spectrum of findings could be indicative of respiratory bronchiolitis-interstitial lung disease (RB-ILD) in this symptomatic current smoker. 4. Moderate patchy lobular air trapping in both lungs, indicative of small airways disease. 5. Ectatic 4.2 cm ascending thoracic aorta, stable. Recommend annual imaging followup by CTA or MRA. This recommendation follows 2010 ACCF/AHA/AATS/ACR/ASA/SCA/SCAI/SIR/STS/SVM Guidelines for the Diagnosis and Management of Patients with Thoracic Aortic Disease. Circulation. 2010; 121: I967-E938.   ASSESSMENT:    1. Right arm pain   2. Peripheral vascular disease ([place])   3. PAF (paroxysmal atrial fibrillation) ([place])   4. (HFpEF) heart failure with preserved ejection fraction (HCC)   5. Persistent atrial fibrillation   6. Essential hypertension   7.  Hyperlipidemia, unspecified hyperlipidemia type   8. Morbid obesity ([place])   9. Chronic obstructive pulmonary disease, unspecified COPD type ([place])      PLAN:  In order of problems listed above:  1. Right arm pain: There is a discrepancy between her left arm pressure and the right arm pressure.  I will obtain carotid ultrasound to check for subclavian stenosis  2. Acute on chronic diastolic heart failure: I recommend her restart torsemide on a daily basis.  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  She has gained roughly 20 pounds.  Majority of the swelling is seen in the lower extremity.  3. PAF: Continue Xarelto.  On amiodarone, recent CT showed possible pulmonary edema versus interstitial lung disease which could represent toxicity from amiodarone.  I will reduce amiodarone to 100 mg daily.  She will need to see Dr. Gwenlyn Found, potentially consider stop amiodarone and switch to bisoprolol instead.  4. Hypertension: Blood pressure elevated on arrival, however patient is volume overloaded.  Will need diuresis.  Start hydralazine 25 mg 3 times daily.  5. Morbid obesity: Weight loss is strongly recommended to avoid recurrent heart failure issues  6. COPD: Followed by pulmonology service.    Medication Adjustments/Labs and Tests Ordered: Current medicines are reviewed at length with the patient today.  Concerns regarding medicines are outlined above.  Medication changes, Labs and Tests ordered today are listed in the Patient Instructions below. Patient Instructions  Medication Instructions: Sharon Deforest, PA has recommended making the following medication changes:  1. DECREASE Amiodarone to 100 mg daily 2. RESTART Torsemide 40 mg daily 3. START Hydralazine 25 mg THREE times daily  Labwork: NONE ORDERED  Testing/Procedures: 1. Carotid Duplex - Your physician has requested that you have a carotid duplex. This test is an ultrasound  of the carotid arteries in your neck. It looks at blood flow through these arteries that supply the brain with blood. Allow one hour for this exam. There are no restrictions or special instructions.  Follow-up: Sharon Vasquez recommends that you schedule a follow-up appointment in 1-2 months with Dr Gwenlyn Found.  If you need a refill on your cardiac medications before your next appointment, please call your pharmacy.    Sharon Vasquez, [place]  01/04/2018 10:47 PM    [facility] [address], [place], [place]  [address] Phone: [phone]; Fax: [phone]

## 2018-01-04 ENCOUNTER — Other Ambulatory Visit: Payer: Self-pay | Admitting: Physician Assistant

## 2018-01-04 ENCOUNTER — Encounter: Payer: Self-pay | Admitting: Physician Assistant

## 2018-01-04 DIAGNOSIS — I998 Other disorder of circulatory system: Secondary | ICD-10-CM

## 2018-01-04 DIAGNOSIS — I6523 Occlusion and stenosis of bilateral carotid arteries: Secondary | ICD-10-CM

## 2018-01-07 ENCOUNTER — Encounter (HOSPITAL_COMMUNITY): Payer: Self-pay

## 2018-01-07 ENCOUNTER — Ambulatory Visit (HOSPITAL_COMMUNITY)
Admission: RE | Admit: 2018-01-07 | Payer: Medicare Other | Source: Ambulatory Visit | Attending: Physician Assistant | Admitting: Physician Assistant

## 2018-01-07 NOTE — Telephone Encounter (Signed)
LM

## 2018-01-07 NOTE — Telephone Encounter (Signed)
Spoke with patient, made aware of results. Patient would like a call from NP Volanda Napoleon to talk about it in a little more detail. Informed patient I would get a copy of her CT sent to her cardiologist for follow up.   Beth please advise if willing to call she said she wold be available all day today.

## 2018-01-07 NOTE — Telephone Encounter (Signed)
LMOM to call back regarding results from HRCT and needs f/u with cardiology.

## 2018-01-07 NOTE — Telephone Encounter (Signed)
Per patient, Cardiology lowered dose of amiodarone from 200mg  to 100mg  but [place] [facility] care will not pay for this. Still needs follow up with them

## 2018-01-08 ENCOUNTER — Telehealth: Payer: Self-pay | Admitting: Physician Assistant

## 2018-01-08 MED ORDER — AMIODARONE HCL 200 MG PO TABS: 100.0000 mg | ORAL_TABLET | Freq: Every day | ORAL | 3 refills | Status: DC

## 2018-01-08 NOTE — Telephone Encounter (Signed)
New message   Pt c/o medication issue:  1. Name of Medication: amiodarone (PACERONE) 100 MG tablet  2. How are you currently taking this medication (dosage and times per day)? 1 time daily   3. Are you having a reaction (difficulty breathing--STAT)?no   4. What is your medication issue?patient states that she can't afford the medicine. The patient states that this medicine is $179.00. Patient wants to know what her options are. Please call to discuss.

## 2018-01-08 NOTE — Telephone Encounter (Signed)
Pt called c/o the cost of her Amiodarone 100mg  per the pharmacist will call in the  200mg  and she can cut it in half and it will help with the cost. Pt verbalized understanding.

## 2018-01-11 ENCOUNTER — Ambulatory Visit (HOSPITAL_COMMUNITY)
Admission: RE | Admit: 2018-01-11 | Discharge: 2018-01-11 | Disposition: A | Discharge status: Home / Self Care | Payer: Medicare Other | Source: Ambulatory Visit | Attending: Cardiovascular Disease | Admitting: Cardiovascular Disease

## 2018-01-11 DIAGNOSIS — I998 Other disorder of circulatory system: Secondary | ICD-10-CM | POA: Diagnosis present

## 2018-01-11 DIAGNOSIS — I6523 Occlusion and stenosis of bilateral carotid arteries: Secondary | ICD-10-CM | POA: Diagnosis present

## 2018-01-11 DIAGNOSIS — R6889 Other general symptoms and signs: Secondary | ICD-10-CM | POA: Diagnosis not present

## 2018-01-14 ENCOUNTER — Other Ambulatory Visit: Payer: Self-pay

## 2018-01-14 ENCOUNTER — Telehealth: Payer: Self-pay

## 2018-01-14 DIAGNOSIS — I739 Peripheral vascular disease, unspecified: Secondary | ICD-10-CM

## 2018-01-14 MED ORDER — ATORVASTATIN CALCIUM 40 MG PO TABS: 40.0000 mg | ORAL_TABLET | Freq: Every day | ORAL | 3 refills | Status: DC

## 2018-01-14 MED ORDER — ESOMEPRAZOLE MAGNESIUM 40 MG PO CPDR: 40.0000 mg | DELAYED_RELEASE_CAPSULE | Freq: Every day | ORAL | 3 refills | Status: DC | PRN

## 2018-01-14 MED ORDER — AMIODARONE HCL 200 MG PO TABS: 100.0000 mg | ORAL_TABLET | Freq: Every day | ORAL | 3 refills | Status: DC

## 2018-01-14 MED ORDER — TORSEMIDE 20 MG PO TABS: 40.0000 mg | ORAL_TABLET | Freq: Every day | ORAL | 3 refills | Status: DC

## 2018-01-14 NOTE — Telephone Encounter (Signed)
-----   Message from [place], [place] sent at 01/11/2018  4:14 PM EDT ----- Mild carotid disease bilaterally.

## 2018-01-14 NOTE — Telephone Encounter (Signed)
Called patient, advised of note below. Patient wanted to know what that was, explained to patient that it was narrowing of her arteries due to to plaque, I advised her to keep her appointment with Dr.Berry in December and to call sooner if any issue. Patient states she has not picked up her Xarelto yet, I did advised patient to go by and get it that she needed to be taking it. Patient did verbalized understanding.

## 2018-01-14 NOTE — Telephone Encounter (Signed)
Spoke with patient. She is aware of results. She wishes to proceed with the cpap titration. Order has been placed. Patient is aware. Nothing further needed at time of call.

## 2018-01-16 ENCOUNTER — Telehealth: Payer: Self-pay | Admitting: Physician Assistant

## 2018-01-16 NOTE — Telephone Encounter (Signed)
Spoke with Rep from [facility] who states they were needing Dr. Stanford Breed NPI number to fill mediation sent on 01/08/18. Information provided

## 2018-01-16 NOTE — Telephone Encounter (Signed)
New Message        Pharmacist is needing clarification on patient medication.

## 2018-01-21 ENCOUNTER — Ambulatory Visit
Admission: RE | Admit: 2018-01-21 | Discharge: 2018-01-21 | Disposition: A | Discharge status: Home / Self Care | Payer: Medicare Other | Source: Ambulatory Visit | Attending: Internal Medicine | Admitting: Internal Medicine

## 2018-01-21 DIAGNOSIS — R6889 Other general symptoms and signs: Secondary | ICD-10-CM | POA: Diagnosis not present

## 2018-01-21 DIAGNOSIS — Z1231 Encounter for screening mammogram for malignant neoplasm of breast: Secondary | ICD-10-CM

## 2018-01-23 DIAGNOSIS — M170 Bilateral primary osteoarthritis of knee: Secondary | ICD-10-CM | POA: Diagnosis not present

## 2018-01-29 ENCOUNTER — Telehealth: Payer: Self-pay | Admitting: Physician Assistant

## 2018-01-29 NOTE — Telephone Encounter (Signed)
New Message          Pt c/o BP issue: STAT if pt c/o blurred vision, one-sided weakness or slurred speech  1. What are your last 5 BP readings?      6;00 am 168/106      6:30       168/106      7:00 am  165/105      9:27 am   157/98    2. Are you having any other symptoms (ex. Dizziness, headache, blurred vision, passed out)? Bp is elevated/ slight headach.   3. What is your BP issue? New medication is not working

## 2018-01-29 NOTE — Telephone Encounter (Signed)
Returned call to patient she stated her B/P has been elevated for the past 3 days.B/P readings listed below.After reviewing her medications she stopped taking Benazepril 40 mg when Hydralazine was started 01/02/18.Advised to restart Benazepril 40 mg daily in addition to Hrydralazine 25 mg three times a day.Continue all other medications as prescribed.Advised to keep monitoring B/P and call back if B/P continues to be elevated.

## 2018-01-31 NOTE — Telephone Encounter (Signed)
I agree with the recommendation.

## 2018-02-07 ENCOUNTER — Ambulatory Visit (HOSPITAL_COMMUNITY)
Admission: RE | Admit: 2018-02-07 | Discharge: 2018-02-07 | Disposition: A | Discharge status: Home / Self Care | Payer: Medicare Other | Source: Ambulatory Visit | Attending: Internal Medicine | Admitting: Internal Medicine

## 2018-02-07 ENCOUNTER — Encounter: Payer: Self-pay | Admitting: Internal Medicine

## 2018-02-07 ENCOUNTER — Ambulatory Visit (INDEPENDENT_AMBULATORY_CARE_PROVIDER_SITE_OTHER): Payer: Medicare Other | Admitting: Internal Medicine

## 2018-02-07 ENCOUNTER — Other Ambulatory Visit: Payer: Self-pay

## 2018-02-07 VITALS — BP 168/77 | HR 64 | Temp 98.7°F | Ht 74.0 in | Wt 350.3 lb

## 2018-02-07 DIAGNOSIS — M79601 Pain in right arm: Secondary | ICD-10-CM

## 2018-02-07 DIAGNOSIS — M47812 Spondylosis without myelopathy or radiculopathy, cervical region: Secondary | ICD-10-CM | POA: Insufficient documentation

## 2018-02-07 DIAGNOSIS — Z791 Long term (current) use of non-steroidal anti-inflammatories (NSAID): Secondary | ICD-10-CM | POA: Diagnosis not present

## 2018-02-07 DIAGNOSIS — R200 Anesthesia of skin: Secondary | ICD-10-CM

## 2018-02-07 DIAGNOSIS — M5412 Radiculopathy, cervical region: Secondary | ICD-10-CM

## 2018-02-07 DIAGNOSIS — Z79899 Other long term (current) drug therapy: Secondary | ICD-10-CM

## 2018-02-07 DIAGNOSIS — Z7901 Long term (current) use of anticoagulants: Secondary | ICD-10-CM

## 2018-02-07 DIAGNOSIS — I10 Essential (primary) hypertension: Secondary | ICD-10-CM

## 2018-02-07 DIAGNOSIS — M5030 Other cervical disc degeneration, unspecified cervical region: Secondary | ICD-10-CM

## 2018-02-07 DIAGNOSIS — R6889 Other general symptoms and signs: Secondary | ICD-10-CM | POA: Diagnosis not present

## 2018-02-07 NOTE — Progress Notes (Signed)
   CC: Hypertension, Pain and Numbness of Right Upper Extremity  HPI:  Sharon Vasquez is a 67 y.o. F with PMHx listed below presenting for  Hypertension, Pain and Numbness of Right Upper Extremity. Please see the A&P for the status of the patient's chronic medical problems.   Past Medical History:  Diagnosis Date  . A-fib ([place])   . Anxiety   . Arthritis    "qwhre; joints, back" (04/17/2017)  . Benign breast cyst in female, left 01/08/2017   Found by Screening mammogram, evaluated by U/S on 01/08/17 and determined to be a benign simple breast cyst.  . Cellulitis of left lower leg 05/30/2017  . CHF (congestive heart failure) ([place])   . Chronic lower back pain   . Chronic venous insufficiency    [name] 05/30/2017  . COPD (chronic obstructive pulmonary disease) ([place])   . Depression   . DVT (deep venous thrombosis) ([place]) 11/16/2016  . GERD (gastroesophageal reflux disease)   . Headache    "weekly for the last 3 months" (04/17/2017)  . Hyperlipidemia   . Hypertension   . Morbid obesity ([place])   . PE (pulmonary embolism)   . Pulmonary embolism ([place]) 09/21/2014   Review of Systems: Performed and all others negative.  Physical Exam:  Vitals:   02/07/18 1507 02/07/18 1519  BP: (!) 173/83 (!) 168/77  Pulse: 64   Temp: 98.7 F (37.1 C)   TempSrc: Oral   SpO2: 98%   Weight: (!) 350 lb 4.8 oz (158.9 kg)   Height: 6\' 2"  (1.88 m)    Physical Exam  Constitutional: She appears well-developed and well-nourished. No distress.  Cardiovascular: Normal rate, regular rhythm, normal heart sounds and intact distal pulses.  Pulmonary/Chest: Effort normal and breath sounds normal. No respiratory distress.  Abdominal: Soft. Bowel sounds are normal. She exhibits no distension. There is no tenderness.  Musculoskeletal: She exhibits edema. She exhibits no deformity.  Right grip strength is 4/5, Left grip strength 5/5. Strength 5/5 in biceps, triceps and shoulders bilaterally. Negative empty can  test. Sensation grossly intact.   Chronic Bilateral LE Edema Biceps reflex unable to be assessed due to body habitus     Assessment & Plan:   See Encounters Tab for problem based charting.  Patient discussed with Dr. Beryle Beams

## 2018-02-07 NOTE — Patient Instructions (Addendum)
Thank you for allowing [place] to care for you  For you blood pressure - BP elevated today to 168/77 - Start taking Hydralazine three times a day - Continue Benazepril and Torsemide Daily  For your right arm pain - We are getting an x-ray of your neck to look for changes in your spine - You will be contacted with the results  Please follow up in about 3 months

## 2018-02-08 ENCOUNTER — Other Ambulatory Visit: Payer: Self-pay | Admitting: Internal Medicine

## 2018-02-08 DIAGNOSIS — M5030 Other cervical disc degeneration, unspecified cervical region: Secondary | ICD-10-CM

## 2018-02-08 DIAGNOSIS — M79601 Pain in right arm: Secondary | ICD-10-CM

## 2018-02-08 DIAGNOSIS — R200 Anesthesia of skin: Principal | ICD-10-CM

## 2018-02-08 DIAGNOSIS — Z7901 Long term (current) use of anticoagulants: Secondary | ICD-10-CM

## 2018-02-08 MED ORDER — PANTOPRAZOLE SODIUM 20 MG PO TBEC: 20.0000 mg | DELAYED_RELEASE_TABLET | Freq: Every day | ORAL | 0 refills | Status: DC

## 2018-02-08 NOTE — Assessment & Plan Note (Addendum)
Patient reports 2 months of right shoulder pain with intermittent numbness and cold sensation that radiates down her right arm. She also endorses intermittently reduced grip strength in her right hand; even intermittently dropping things. On exam right grip strength is 4/5, left grip strength 5/5. Strength 5/5 in biceps, triceps and shoulders bilaterally. Negative empty can test. Sensation grossly intact.  Patient's history and exam are suspicious for cervical radiculopathy. Will send for cervical X-ray and then Consider MRI based on those results. - X-ray Cervical Spine  ADDENDUM: X-ray of cervical spine showed degenerative changes of cervical spine. Will contact patient and inform her of the results. Will also order MRI, instruct patient to avoid activities that trigger her pain, suggest she pick up a soft cervical collar to wear to help reduce strain on her neck, and to take short course of over the counter NSAIDs for the next 6 weeks. If this fails, or pain progresses will need to consider course of oral prednisone. Will refer to PT when patient can tolerate her pain. - MRI cervical spine - Naproxen for pain (Contacted patient and she stated this is her preferred NSAID) - Protonix 20mg  Daily for prophylaxis against GIB with anticoagulant and NSAID use. - Rest and soft cervical collar - BMP to monitor renal function at follow up

## 2018-02-08 NOTE — Assessment & Plan Note (Signed)
BP elevated today at 168/77. Her BP has been persistently elevated and she was recently started on hydralazine TID. She states she has only been taking this BID as she did not have refills when she initially contacted the pharmacy. She also states that she misses a dose of her Torsemide about once a week due to the increased urination disrupting her daily activities. The importance of taking all medications daily as prescribed was reviewed with the patient. It was confirmed in the EMR that she does have refills available. - Benazepril 40mg  Daily - Torsemide 40mg  Daily - Hydralazine 25mg  TID

## 2018-02-09 NOTE — Progress Notes (Signed)
Medicine attending: Medical history, presenting problems, physical findings, and medications, reviewed with resident physician Dr Neva Seat on the day of the patient visit and I concur with his evaluation and management plan. Pt signs/sxs consistent w a cervical radiculopathy. Advanced DJD C spine confirmed on regular XRays. Will schedule MRI to assess need for surgical intervention.

## 2018-02-13 ENCOUNTER — Encounter: Payer: Self-pay | Admitting: Pulmonary Disease

## 2018-02-13 ENCOUNTER — Ambulatory Visit (INDEPENDENT_AMBULATORY_CARE_PROVIDER_SITE_OTHER): Payer: Medicare Other | Admitting: Pulmonary Disease

## 2018-02-13 DIAGNOSIS — J432 Centrilobular emphysema: Secondary | ICD-10-CM | POA: Diagnosis not present

## 2018-02-13 DIAGNOSIS — J849 Interstitial pulmonary disease, unspecified: Secondary | ICD-10-CM | POA: Diagnosis not present

## 2018-02-13 DIAGNOSIS — G4733 Obstructive sleep apnea (adult) (pediatric): Secondary | ICD-10-CM | POA: Diagnosis not present

## 2018-02-13 DIAGNOSIS — R6889 Other general symptoms and signs: Secondary | ICD-10-CM | POA: Diagnosis not present

## 2018-02-13 NOTE — Assessment & Plan Note (Signed)
-  May be related to some degree of edema, RB ILD or effect of amiodarone. Discussed the importance of using diuretics. We will discuss with cardiology if amiodarone can be stopped in the setting. Clearly smoking cessation is paramount and this was discussed

## 2018-02-13 NOTE — Progress Notes (Signed)
Subjective:    Patient ID: Sharon Vasquez, female    DOB: September 01, 1950, 67 y.o.   [name]: [id number]  HPI  67 year old morbidly obese smoker for follow-up of ILD and OSA She smoked up to 2 packs/day, has now decreased to about 2 cigarettes daily  PMH - chronic diastolic heart failure.  She has a history of pulmonary embolism/ LLE DVT 2015 and has been maintained on Xarelto since then.  She underwent venoplasty and stenting of left common iliac and external iliac vein Duplex 05/2017 did not show any residual stenosis. -on amiodarone and Xarelto for chronic atrial fibrillation   She reports difficulty taking her diuretics because they "keep her in the bathroom all day".  She does understand that not taking this bolus of fluid in her lungs.  Amiodarone has been decreased to 100 mg daily after cardiology visit. She has been able to cut down cigarettes to 2/day which she feels and she has made huge progress with. We reviewed high-resolution CT results. We also discussed sleep study results.  She continues to remain short of breath, uses albuterol neb sporadically about once or twice a week  Detailed medication review was performed She is up-to-date on Pneumovax and refuses flu shot  Significant tests/ events reviewed HRCT 12/2017 >> Mild interlobular septal thickening and mild patchy peribronchovascular ground-glass opacity in both lungs. Minimal patchy subpleural reticulation in the upper lobes ? Pul edema vs RB-ILD  PFTs 12/2017 - No obstruction, mild-mod restriction, decreased DLCO  FVC 2.16 (66%), FEV1 1.93 (75%), ratio 89, DLCOcor 16.45 (48%)  FENO 12/2017 5  ECHO 07/2016 - EF 60-65%, PA peak pressure 59mm Hg  HST 12/2017 >> AHI 20/h   Past Medical History:  Diagnosis Date  . A-fib ([place])   . Anxiety   . Arthritis    "qwhre; joints, back" (04/17/2017)  . Benign breast cyst in female, left 01/08/2017   Found by Screening mammogram, evaluated by U/S on 01/08/17 and determined to be a  benign simple breast cyst.  . Cellulitis of left lower leg 05/30/2017  . CHF (congestive heart failure) ([place])   . Chronic lower back pain   . Chronic venous insufficiency    [name] 05/30/2017  . COPD (chronic obstructive pulmonary disease) ([place])   . Depression   . DVT (deep venous thrombosis) ([place]) 11/16/2016  . GERD (gastroesophageal reflux disease)   . Headache    "weekly for the last 3 months" (04/17/2017)  . Hyperlipidemia   . Hypertension   . Morbid obesity ([place])   . PE (pulmonary embolism)   . Pulmonary embolism ([place]) 09/21/2014     Review of Systems neg for any significant sore throat, dysphagia, itching, sneezing, nasal congestion or excess/ purulent secretions, fever, chills, sweats, unintended wt loss, pleuritic or exertional cp, hempoptysis, orthopnea pnd or change in chronic leg swelling. Also denies presyncope, palpitations, heartburn, abdominal pain, nausea, vomiting, diarrhea or change in bowel or urinary habits, dysuria,hematuria, rash, arthralgias, visual complaints, headache, numbness weakness or ataxia.     Objective:   Physical Exam   Gen. Pleasant, obese, in no distress, normal affect ENT - no lesions, no post nasal drip, class 2-3 airway Neck: No JVD, no thyromegaly, no carotid bruits Lungs: no use of accessory muscles, no dullness to percussion, decreased without rales or rhonchi  Cardiovascular: Rhythm regular, heart sounds  normal, no murmurs or gallops, 2+ peripheral edema Abdomen: soft and non-tender, no hepatosplenomegaly, BS normal. Musculoskeletal: No deformities, no cyanosis or clubbing Neuro:  alert,  non focal, no tremors        Assessment & Plan:

## 2018-02-13 NOTE — Assessment & Plan Note (Signed)
CPAP titration study will be scheduled based on this we will set her with a CPAP machine  Weight loss encouraged, compliance with goal of at least 4-6 hrs every night is the expectation. Advised against medications with sedative side effects Cautioned against driving when sleepy - understanding that sleepiness will vary on a day to day basis

## 2018-02-13 NOTE — Assessment & Plan Note (Signed)
She does not have significant airway obstruction, but albuterol seems to provide some relief

## 2018-02-13 NOTE — Patient Instructions (Signed)
Markings in your lungs may be related to fluid, smoking or amiodarone.  Be more regular with your fluid pills. Congratulations on cutting down on cigarettes.  I hope you can quit completely!.  We will discuss with cardiology about amiodarone.  Good luck with your sleep study.  We will set you up with CPAP machine afterwards

## 2018-02-22 ENCOUNTER — Ambulatory Visit (HOSPITAL_BASED_OUTPATIENT_CLINIC_OR_DEPARTMENT_OTHER): Payer: Medicare Other | Attending: Pulmonary Disease | Admitting: Pulmonary Disease

## 2018-02-22 DIAGNOSIS — G4733 Obstructive sleep apnea (adult) (pediatric): Secondary | ICD-10-CM | POA: Diagnosis present

## 2018-02-23 DIAGNOSIS — M170 Bilateral primary osteoarthritis of knee: Secondary | ICD-10-CM | POA: Diagnosis not present

## 2018-03-04 ENCOUNTER — Telehealth: Payer: Self-pay | Admitting: Pulmonary Disease

## 2018-03-04 DIAGNOSIS — G4733 Obstructive sleep apnea (adult) (pediatric): Secondary | ICD-10-CM

## 2018-03-04 NOTE — Telephone Encounter (Signed)
Rx to DME for CPAP 13 cm with medium fisher paykel simplus mask  OV in 6 wks with BW/ me

## 2018-03-04 NOTE — Procedures (Signed)
Patient Name: Sharon Vasquez, [place] Date: 02/22/2018 Gender: Female D.O.B: 1950/07/09 Age (years): 51 Referring Provider: Kara Mead MD, ABSM Height (inches): 74 Interpreting Physician: Kara Mead MD, ABSM Weight (lbs): 350 RPSGT: Earney [place] BMI: 45 [name]: [id number] Neck Size: 18.50 <br> <br> CLINICAL INFORMATION The patient  is referred for a CPAP titration to treat sleep apnea.    Date of HST: 12/2017 , AHI 20/h  SLEEP STUDY TECHNIQUE As per the AASM Manual for the Scoring of Sleep and Associated Events v2.3 (April 2016) with a hypopnea requiring 4% desaturations.  The channels recorded and monitored were frontal, central and occipital EEG, electrooculogram (EOG), submentalis EMG (chin), nasal and oral airflow, thoracic and abdominal wall motion, anterior tibialis EMG, snore microphone, electrocardiogram, and pulse oximetry. Continuous positive airway pressure (CPAP) was initiated at the beginning of the study and titrated to treat sleep-disordered breathing.  MEDICATIONS Medications self-administered by patient taken the night of the study : N/A  TECHNICIAN COMMENTS Comments added by technician: Patient was restless all through the night. Patient had difficulty initiating sleep.  RESPIRATORY PARAMETERS Optimal PAP Pressure (cm): 13 AHI at Optimal Pressure (/hr): 2.6 Overall Minimal O2 (%): 84.0 Supine % at Optimal Pressure (%): N/A Minimal O2 at Optimal Pressure (%): 88.0   SLEEP ARCHITECTURE The study was initiated at 10:42:38 PM and ended at 4:48:55 AM.  Sleep onset time was 6.4 minutes and the sleep efficiency was 79.2%%. The total sleep time was 290 minutes.  The patient spent 3.4%% of the night in stage N1 sleep, 77.1%% in stage N2 sleep, 0.0%% in stage N3 and 19.5% in REM.Stage REM latency was 143.5 minutes  Wake after sleep onset was 69.9. Alpha intrusion was absent. Supine sleep was 47.14%.  CARDIAC DATA The 2 lead EKG demonstrated sinus rhythm. The mean  heart rate was 64.1 beats per minute. Other EKG findings include: None.  LEG MOVEMENT DATA The total Periodic Limb Movements of Sleep (PLMS) were 0. The PLMS index was 0.0. A PLMS index of <15 is considered normal in adults.  IMPRESSIONS - An optimal PAP pressure of 13 cm was selected for this patient based on the available study data. - Central sleep apnea was not noted during this titration (CAI = 1.4/h). - Moderate oxygen desaturations were observed during this titration (min O2 = 84.0%). - The patient snored with soft snoring volume during this titration study. - No cardiac abnormalities were observed during this study. - Clinically significant periodic limb movements were not noted during this study. Arousals associated with PLMs were rare.   DIAGNOSIS - Obstructive Sleep Apnea (327.23 [G47.33 ICD-10])   RECOMMENDATIONS - Recommend a trial of CPAP 13 cm with medium fisher paykel simplus mask - Avoid alcohol, sedatives and other CNS depressants that may worsen sleep apnea and disrupt normal sleep architecture. - Sleep hygiene should be reviewed to assess factors that may improve sleep quality. - Weight management and regular exercise should be initiated or continued. - Return to [facility] for re-evaluation after 4 weeks of therapy   Kara Mead MD Board Certified in [place]

## 2018-03-05 DIAGNOSIS — G4733 Obstructive sleep apnea (adult) (pediatric): Secondary | ICD-10-CM | POA: Diagnosis not present

## 2018-03-05 NOTE — Telephone Encounter (Signed)
Attempted to contact pt. I did not receive an answer. I have left a message for pt to return our call.  

## 2018-03-06 NOTE — Telephone Encounter (Signed)
Spoke with the pt and notified of recs per RA  Order sent to [facility]

## 2018-03-06 NOTE — Telephone Encounter (Signed)
Patient returning call, CB is [phone].

## 2018-03-13 ENCOUNTER — Ambulatory Visit: Payer: Medicare Other | Admitting: Cardiovascular Disease

## 2018-03-14 ENCOUNTER — Encounter: Payer: Self-pay | Admitting: *Deleted

## 2018-03-23 ENCOUNTER — Ambulatory Visit (HOSPITAL_COMMUNITY)
Admission: RE | Admit: 2018-03-23 | Discharge: 2018-03-23 | Disposition: A | Discharge status: Home / Self Care | Payer: Medicare Other | Source: Ambulatory Visit | Attending: Internal Medicine | Admitting: Internal Medicine

## 2018-03-23 DIAGNOSIS — M47812 Spondylosis without myelopathy or radiculopathy, cervical region: Secondary | ICD-10-CM | POA: Diagnosis not present

## 2018-03-23 DIAGNOSIS — M79601 Pain in right arm: Secondary | ICD-10-CM

## 2018-03-23 DIAGNOSIS — M4802 Spinal stenosis, cervical region: Secondary | ICD-10-CM | POA: Diagnosis not present

## 2018-03-23 DIAGNOSIS — R200 Anesthesia of skin: Secondary | ICD-10-CM | POA: Insufficient documentation

## 2018-03-23 DIAGNOSIS — M5030 Other cervical disc degeneration, unspecified cervical region: Secondary | ICD-10-CM | POA: Diagnosis present

## 2018-03-23 DIAGNOSIS — M4312 Spondylolisthesis, cervical region: Secondary | ICD-10-CM | POA: Insufficient documentation

## 2018-03-25 DIAGNOSIS — M170 Bilateral primary osteoarthritis of knee: Secondary | ICD-10-CM | POA: Diagnosis not present

## 2018-03-26 ENCOUNTER — Telehealth: Payer: Self-pay | Admitting: Pulmonary Disease

## 2018-03-26 DIAGNOSIS — G4733 Obstructive sleep apnea (adult) (pediatric): Secondary | ICD-10-CM

## 2018-03-26 NOTE — Telephone Encounter (Signed)
Spoke with pt,  She would like her order sent to [facility] instead of [facility].Order placed again with [facility] on the order. FYI PCC's

## 2018-03-28 NOTE — Telephone Encounter (Signed)
Called and spoke with Crystal with [facility] at phone [phone] Regarding new cpap and supplies order for the pt Pt refused to go with this DME as she is unable to afford upfront pmt of $95 for machine and supplies Per Crystal, pt's benefits is 80/20 and she owes a pmt upfront and monthly  Called and spoke with pt regarding this order. Pt is unable to afford this fee with [employer] [employer] and [place] pt the pt assistance program, she advised she is unable to pay $100.00 up front to start the program She will call [place] [facility] again, as they are out of network but stated to pt they are willing to work with her Pt advised she will call back our office to let us know what she is planning to do with her cpap machine

## 2018-03-29 NOTE — Telephone Encounter (Signed)
ATC patient to see if she was able to talk to [facility] lmom

## 2018-03-30 ENCOUNTER — Other Ambulatory Visit: Payer: Self-pay | Admitting: Internal Medicine

## 2018-03-30 DIAGNOSIS — M545 Low back pain, unspecified: Secondary | ICD-10-CM

## 2018-03-30 DIAGNOSIS — G8929 Other chronic pain: Secondary | ICD-10-CM

## 2018-04-01 ENCOUNTER — Telehealth: Payer: Self-pay

## 2018-04-01 MED ORDER — IBUPROFEN 800 MG PO TABS: 800.0000 mg | ORAL_TABLET | Freq: Three times a day (TID) | ORAL | 0 refills | Status: DC | PRN

## 2018-04-01 NOTE — Telephone Encounter (Signed)
Called pt back, reviewed MRI C spine which shows DDD no concerning urgent neuro findings. Plan for NSAIDS and PT as outlined by Dr Trilby Drummer.  Reports that has been alternating Naproxen and Ibuprofen, feels ibuprofen is more effective.  Will have her stop naproxen and take only ibuprofen, will need follow up for recheck BMP in 2 weeks ([facility] or Dr Trilby Drummer)

## 2018-04-01 NOTE — Telephone Encounter (Signed)
Requesting MRI results. Please call pt back. 

## 2018-04-02 NOTE — Telephone Encounter (Signed)
Noted. Will route this over to [place] to follow up on.

## 2018-04-02 NOTE — Telephone Encounter (Signed)
Patient called back, states appt with [facility] is 04/10/2018.  If need to call patient, CB is [phone].  Rescheduled her CPAP follow up for 05/13/2018.

## 2018-04-02 NOTE — Telephone Encounter (Signed)
Called and spoke with pt to see if she had been able to reach out to [facility] but pt stated she had not done it yet. Pt stated to me that she would try to call them today to see if they will be able to help her out with cpap and supplies and then after she called [facility], she would call our office back.

## 2018-04-10 DIAGNOSIS — G4733 Obstructive sleep apnea (adult) (pediatric): Secondary | ICD-10-CM | POA: Diagnosis not present

## 2018-04-10 DIAGNOSIS — R6889 Other general symptoms and signs: Secondary | ICD-10-CM | POA: Diagnosis not present

## 2018-04-10 DIAGNOSIS — M170 Bilateral primary osteoarthritis of knee: Secondary | ICD-10-CM | POA: Diagnosis not present

## 2018-04-12 ENCOUNTER — Ambulatory Visit (INDEPENDENT_AMBULATORY_CARE_PROVIDER_SITE_OTHER): Payer: Medicare Other | Admitting: Cardiovascular Disease

## 2018-04-12 ENCOUNTER — Encounter: Payer: Self-pay | Admitting: Cardiovascular Disease

## 2018-04-12 VITALS — BP 139/89 | HR 76 | Ht 74.0 in | Wt 334.4 lb

## 2018-04-12 DIAGNOSIS — I2510 Atherosclerotic heart disease of native coronary artery without angina pectoris: Secondary | ICD-10-CM | POA: Insufficient documentation

## 2018-04-12 DIAGNOSIS — I739 Peripheral vascular disease, unspecified: Secondary | ICD-10-CM | POA: Diagnosis not present

## 2018-04-12 DIAGNOSIS — R6889 Other general symptoms and signs: Secondary | ICD-10-CM | POA: Diagnosis not present

## 2018-04-12 MED ORDER — METOPROLOL TARTRATE 100 MG PO TABS: 100.0000 mg | ORAL_TABLET | Freq: Two times a day (BID) | ORAL | 3 refills | Status: DC

## 2018-04-12 MED ORDER — METOPROLOL TARTRATE 100 MG PO TABS: 100.0000 mg | ORAL_TABLET | Freq: Once | ORAL | 0 refills | Status: DC

## 2018-04-12 NOTE — Assessment & Plan Note (Signed)
History of obstructive sleep apnea recently started on CPAP.

## 2018-04-12 NOTE — Patient Instructions (Signed)
Please arrive at the [facility] main entrance of [facility] at xx:xx AM (30-45 minutes prior to test start time)  [facility] [address], [place] [address] [phone]  Proceed to the [facility] Radiology Department (First Floor).  Please follow these instructions carefully (unless otherwise directed):  Hold all erectile dysfunction medications at least 48 hours prior to test.  On the Night Before the Test: . Be sure to Drink plenty of water. . Do not consume any caffeinated/decaffeinated beverages or chocolate 12 hours prior to your test. . Do not take any antihistamines 12 hours prior to your test.  On the Day of the Test: . Drink plenty of water. Do not drink any water within one hour of the test. . Do not eat any food 4 hours prior to the test. . You may take your regular medications prior to the test.  . Take metoprolol (Lopressor) two hours prior to test. . HOLD torsemide morning of the test.   *For Clinical Staff only. Please instruct patient the following:*        -Drink plenty of water       -Hold torsemide morning of the test       -Take metoprolol (Lopressor) 2 hours prior to test (if applicable).                               -IF HR is greater than 55 BPM and patient is less than or equal to 51 yrs old Lopressor 100mg  x1.                Do not give Lopressor to patients with an allergy to lopressor or anyone with asthma or active COPD symptoms (currently taking steroids).       After the Test: . Drink plenty of water. . After receiving IV contrast, you may experience a mild flushed feeling. This is normal. . On occasion, you may experience a mild rash up to 24 hours after the test. This is not dangerous. If this occurs, you can take Benadryl 25 mg and increase your fluid intake. . If you experience trouble breathing, this can be serious. If it is severe call 911 IMMEDIATELY. If it is mild, please call our office. . If you take  any of these medications: Glipizide/Metformin, Avandament, Glucavance, please do not take 48 hours after completing test.   Lab work: You will need lab work drawn [place], BMET If you have labs (blood work) drawn today and your tests are completely normal, you will receive your results only by: [name] MyChart Message (if you have MyChart) OR . A paper copy in the mail If you have any lab test that is abnormal or we need to change your treatment, we will call you to review the results.  Follow-Up: At [facility], you and your health needs are our priority.  As part of our continuing mission to provide you with exceptional heart care, we have created designated Provider Care Teams.  These Care Teams include your primary Cardiologist (physician) and Advanced Practice Providers (APPs -  Physician Assistants and Nurse Practitioners) who all work together to provide you with the care you need, when you need it. . You will need a follow up appointment in 3 months.  You may see Dr. Gwenlyn Found or one of the following Advanced Practice Providers on your designated Care Team:   . Kerin Ransom, [place] . Isaac Laud  Meng, PA-C . Fabian Sharp, PA-C . Jory Sims, DNP . Rosaria Ferries, PA-C . Roby Lofts, PA-C . Sande Rives, PA-C

## 2018-04-12 NOTE — Assessment & Plan Note (Signed)
History of PE of unknown cause on chronic Xarelto oral anticoagulation.

## 2018-04-12 NOTE — Assessment & Plan Note (Signed)
She has chronic pain in her right upper extremity.  She has a 20 point blood pressure differential left greater than right.  She apparently had an MRI of her neck which shows radicular disease.  I suspect this is neurologic in nature although I cannot rule out a vascular cause.  She does have a 2+ right radial pulse.  At this point, I do not feel compelled to further evaluate this.

## 2018-04-12 NOTE — Assessment & Plan Note (Signed)
She of PAF on low-dose amiodarone and Xarelto maintaining sinus rhythm.

## 2018-04-12 NOTE — Progress Notes (Signed)
04/12/2018 Sharon Vasquez   1950-06-22  [id number]  Primary Physician Neva Seat, MD Primary Cardiologist: Lorretta Harp MD FACP, [place], [place], [place]  HPI:  Sharon Vasquez is a 68 y.o. 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  She has a history of treated hypertension hyperlipidemia.  She is had a pulmonary embolus in the past on Xarelto oral anticoagulation.  Sister recently had a myocardial infarction.  She has greater than 100 pack years of tobacco abuse as well and continues to smoke.  She is never had a heart attack or stroke.  She did have a high resolution chest CT performed 12/31/2017 that showed left main/three-vessel coronary calcification.  She does complain of some atypical chest pain.  She recently had a sleep study and was begun on CPAP.  She is aware of salt restriction.   Current Meds  Medication Sig  . albuterol (PROAIR HFA) 108 (90 Base) MCG/ACT inhaler Inhale 2 puffs into the lungs every 6 (six) hours as needed for wheezing or shortness of breath.  [name] albuterol (PROVENTIL) (2.5 MG/3ML) 0.083% nebulizer solution Take 3 mLs (2.5 mg total) by nebulization every 4 (four) hours as needed for wheezing or shortness of breath.  [name] alendronate (FOSAMAX) 70 MG tablet Take 1 tablet (70 mg total) by mouth every 7 (seven) days. Take with a full glass of water on an empty stomach.  [name] amiodarone (PACERONE) 200 MG tablet Take 0.5 tablets (100 mg total) by mouth daily.  [name] aspirin EC 81 MG tablet Take 81 mg by mouth daily.  [name] atorvastatin (LIPITOR) 40 MG tablet Take 1 tablet (40 mg total) by mouth daily.  . benazepril (LOTENSIN) 40 MG tablet Take 1 tablet (40 mg total) by mouth daily.  [name] esomeprazole (NEXIUM) 40 MG capsule Take 1 capsule (40 mg total) by mouth daily as needed (for  heartburn or indigestion).  . Fluticasone-Salmeterol (ADVAIR DISKUS) 250-50 MCG/DOSE AEPB Inhale 1 puff into the lungs 2 (two) times daily. Rinse mouth after each use  . gabapentin (NEURONTIN) 400 MG capsule Take 1 capsule (400 mg total) by mouth 2 (two) times daily.  . hydrALAZINE (APRESOLINE) 25 MG tablet Take 1 tablet (25 mg total) by mouth 3 (three) times daily.  [name] ibuprofen (ADVIL,MOTRIN) 800 MG tablet Take 1 tablet (800 mg total) by mouth every 8 (eight) hours as needed.  [name] oxyCODONE-acetaminophen (PERCOCET/ROXICET) 5-325 MG tablet Take 1 tablet by mouth daily as needed for severe pain.  . pantoprazole (PROTONIX) 20 MG tablet TAKE 1 TABLET(20 MG) BY MOUTH DAILY  . potassium chloride (K-DUR,KLOR-CON) 10 MEQ tablet Take 2 tablets (20 mEq total) by mouth 2 (two) times daily.  . rivaroxaban (XARELTO) 20 MG TABS tablet Take 1 tablet (20 mg total) by mouth every morning.  . tiotropium (SPIRIVA HANDIHALER) 18 MCG inhalation capsule Place 1 capsule (18 mcg total) into inhaler and inhale daily.  [name] torsemide (DEMADEX) 20 MG tablet Take 2 tablets (40 mg total) by mouth daily.     No Known Allergies  Social History   Socioeconomic History  . Marital status: Divorced    Spouse name: Not on file  . Number of children: Not on file  . Years of education: Not on file  . Highest education level: Not on file  Occupational History  . Not  on file  Social Needs  . Financial resource strain: Not on file  . Food insecurity:    Worry: Not on file    Inability: Not on file  . Transportation needs:    Medical: Not on file    Non-medical: Not on file  Tobacco Use  . Smoking status: Current Some Day Smoker    Packs/day: 0.10    Years: 55.00    Pack years: 5.50    Types: Cigarettes    Start date: 08/16/1961  . Smokeless tobacco: Never Used  . Tobacco comment: 4-5 cigs per day  Substance and Sexual Activity  . Alcohol use: No  . Drug use: No  . Sexual activity: Not Currently    Partners: Male    Lifestyle  . Physical activity:    Days per week: Not on file    Minutes per session: Not on file  . Stress: Not on file  Relationships  . Social connections:    Talks on phone: Not on file    Gets together: Not on file    Attends religious service: Not on file    Active member of club or organization: Not on file    Attends meetings of clubs or organizations: Not on file    Relationship status: Not on file  . Intimate partner violence:    Fear of current or ex partner: Not on file    Emotionally abused: Not on file    Physically abused: Not on file    Forced sexual activity: Not on file  Other Topics Concern  . Not on file  Social History Narrative   Lives in [facility] senior [facility] in [place]. Lives alone, but is dependent in ADLs/IADLs. 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 Two daughters live nearby. Previously resided in [place].     Review of Systems: General: negative for chills, fever, night sweats or weight changes.  Cardiovascular: negative for chest pain, dyspnea on exertion, edema, orthopnea, palpitations, paroxysmal nocturnal dyspnea or shortness of breath Dermatological: negative for rash Respiratory: negative for cough or wheezing Urologic: negative for hematuria Abdominal: negative for nausea, vomiting, diarrhea, bright red blood per rectum, melena, or hematemesis Neurologic: negative for visual changes, syncope, or dizziness All other systems reviewed and are otherwise negative except as noted above.    Blood pressure 139/89, pulse 76, height 6\' 2"  (1.88 m), weight (!) 334 lb 6.4 oz (151.7 kg).  General appearance: alert and no distress Neck: no adenopathy, no carotid bruit, no JVD, supple, symmetrical, trachea midline and thyroid not enlarged, symmetric, no tenderness/mass/nodules Lungs: clear to auscultation bilaterally Heart: regular rate and rhythm, S1, S2 normal, no murmur, click, rub or gallop Extremities:  extremities normal, atraumatic, no cyanosis or edema Pulses: 2+ and symmetric Skin: Venous stasis changes bilaterally Neurologic: Alert and oriented X 3, normal strength and tone. Normal symmetric reflexes. Normal coordination and gait  EKG not performed today  ASSESSMENT AND PLAN:   Coronary artery calcification seen on CT scan Sharon Vasquez had a high-resolution chest CT performed 12/31/2017 that showed left main/three-vessel coronary atherosclerosis.  Her aorta was ectatic with a largest dimension measured at 4.2 cm.  She does have multiple cardiac risk factors.  I am going to get a coronary CTA to further evaluate.  PAF (paroxysmal atrial fibrillation) (HCC) She of PAF on low-dose amiodarone and Xarelto maintaining sinus rhythm.  (HFpEF) heart failure with preserved ejection fraction ([place]) History of heart failure with  preserved ejection fraction on torsemide.  She was admitted back 2 years ago with respiratory insufficiency related to diastolic heart failure and COPD.  She does not eat salt.  Her torsemide was recently adjusted.  She is lost 16 pounds over the last several months no longer has peripheral edema.  Tobacco use Ongoing tobacco abuse of several cigarettes a day with over 100-pack-year history tobacco abuse along with COPD.  History of pulmonary embolism History of PE of unknown cause on chronic Xarelto oral anticoagulation.  OSA (obstructive sleep apnea) History of obstructive sleep apnea recently started on CPAP.  Pain and numbness of right upper extremity She has chronic pain in her right upper extremity.  She has a 20 point blood pressure differential left greater than right.  She apparently had an MRI of her neck which shows radicular disease.  I suspect this is neurologic in nature although I cannot rule out a vascular cause.  She does have a 2+ right radial pulse.  At this point, I do not feel compelled to further evaluate this.      Lorretta Harp MD  FACP,FACC,FAHA, [facility] 04/12/2018 8:24 AM

## 2018-04-12 NOTE — Assessment & Plan Note (Signed)
Ongoing tobacco abuse of several cigarettes a day with over 100-pack-year history tobacco abuse along with COPD.

## 2018-04-12 NOTE — Assessment & Plan Note (Signed)
History of heart failure with preserved ejection fraction on torsemide.  She was admitted back 2 years ago with respiratory insufficiency related to diastolic heart failure and COPD.  She does not eat salt.  Her torsemide was recently adjusted.  She is lost 16 pounds over the last several months no longer has peripheral edema.

## 2018-04-12 NOTE — Assessment & Plan Note (Signed)
>>  ASSESSMENT AND PLAN FOR DIASTOLIC HEART FAILURE (HCC) WRITTEN ON 04/12/2018  8:22 AM BY BERRY, JONATHAN J, MD  History of heart failure with preserved ejection fraction on torsemide.  She was admitted back 2 years ago with respiratory insufficiency related to diastolic heart failure and COPD.  She does not eat salt.  Her torsemide was recently adjusted.  She is lost 16 pounds over the last several months no longer has peripheral edema.

## 2018-04-12 NOTE — Assessment & Plan Note (Signed)
Ms. Guidotti had a high-resolution chest CT performed 12/31/2017 that showed left main/three-vessel coronary atherosclerosis.  Her aorta was ectatic with a largest dimension measured at 4.2 cm.  She does have multiple cardiac risk factors.  I am going to get a coronary CTA to further evaluate.

## 2018-04-15 ENCOUNTER — Ambulatory Visit: Payer: Medicare Other | Admitting: Primary Care

## 2018-04-16 ENCOUNTER — Telehealth: Payer: Self-pay | Admitting: Pulmonary Disease

## 2018-04-16 NOTE — Telephone Encounter (Signed)
Patient is returning your phone call.  Phone number is [phone].

## 2018-04-16 NOTE — Telephone Encounter (Signed)
Called patient, unable to reach. Left message to give us a call back.  

## 2018-04-16 NOTE — Telephone Encounter (Signed)
Attempted to contact pt. I did not receive an answer. I have left a message for pt to return our call.  

## 2018-04-18 NOTE — Telephone Encounter (Signed)
Spoke with the pt  She states that she is calling to make appt for CPAP f/u  Appt scheduled  Nothing further needed

## 2018-04-25 DIAGNOSIS — M170 Bilateral primary osteoarthritis of knee: Secondary | ICD-10-CM | POA: Diagnosis not present

## 2018-04-26 ENCOUNTER — Telehealth: Payer: Self-pay | Admitting: Cardiovascular Disease

## 2018-04-26 NOTE — Telephone Encounter (Signed)
New Message:      Please call, question about her blood pressure medicine, she wants to know if she is supposed to be taking all three of them.

## 2018-04-26 NOTE — Telephone Encounter (Signed)
Called patient, she states that she wanted to make sure she should take the Metoprolol dose before her CT test. Patient states her BP has been running high, but that she has not been taking her medications as prescribed. I advised patient to take her BP meds as prescribed for 5 days, and take log of BP and HR, and then call us next Wednesday to discuss if the metorpolol dose for the Ct would be needed.  Patient verbalized understanding, and will call back next week.

## 2018-04-29 ENCOUNTER — Other Ambulatory Visit: Payer: Medicare Other

## 2018-04-29 DIAGNOSIS — I739 Peripheral vascular disease, unspecified: Secondary | ICD-10-CM | POA: Diagnosis not present

## 2018-04-30 LAB — BASIC METABOLIC PANEL
BUN/Creatinine Ratio: 14 (ref 12–28)
BUN: 17 mg/dL (ref 8–27)
CO2: 21 mmol/L (ref 20–29)
Calcium: 9.1 mg/dL (ref 8.7–10.3)
Chloride: 107 mmol/L — ABNORMAL HIGH (ref 96–106)
Creatinine, Ser: 1.23 mg/dL — ABNORMAL HIGH (ref 0.57–1.00)
GFR calc Af Amer: 52 mL/min/{1.73_m2} — ABNORMAL LOW (ref >59)
GFR calc non Af Amer: 45 mL/min/{1.73_m2} — ABNORMAL LOW (ref >59)
Glucose: 106 mg/dL — ABNORMAL HIGH (ref 65–99)
Potassium: 4.1 mmol/L (ref 3.5–5.2)
Sodium: 142 mmol/L (ref 134–144)

## 2018-04-30 LAB — CBC
Hematocrit: 37.2 % (ref 34.0–46.6)
Hemoglobin: 12.4 g/dL (ref 11.1–15.9)
MCH: 28.8 pg (ref 26.6–33.0)
MCHC: 33.3 g/dL (ref 31.5–35.7)
MCV: 86 fL (ref 79–97)
Platelets: 389 10*3/uL (ref 150–450)
RBC: 4.31 x10E6/uL (ref 3.77–5.28)
RDW: 13.4 % (ref 11.7–15.4)
WBC: 5.4 10*3/uL (ref 3.4–10.8)

## 2018-05-03 ENCOUNTER — Telehealth (HOSPITAL_COMMUNITY): Payer: Self-pay | Admitting: Emergency Medicine

## 2018-05-03 NOTE — Telephone Encounter (Signed)
Reaching out to patient to offer assistance regarding upcoming cardiac imaging study; pt verbalizes understanding of appt date/time, parking situation and where to check in, pre-test NPO status and medications ordered, and verified current allergies; name and call back number provided for further questions should they arise Delfino Friesen RN Navigator Cardiac Imaging [facility] [phone] office [phone] cell 

## 2018-05-07 ENCOUNTER — Ambulatory Visit (HOSPITAL_COMMUNITY)
Admission: RE | Admit: 2018-05-07 | Discharge: 2018-05-07 | Disposition: A | Discharge status: Home / Self Care | Payer: Medicare Other | Source: Ambulatory Visit | Attending: Cardiovascular Disease | Admitting: Cardiovascular Disease

## 2018-05-07 ENCOUNTER — Encounter (HOSPITAL_COMMUNITY): Payer: Self-pay

## 2018-05-07 DIAGNOSIS — R079 Chest pain, unspecified: Secondary | ICD-10-CM | POA: Insufficient documentation

## 2018-05-07 DIAGNOSIS — I739 Peripheral vascular disease, unspecified: Secondary | ICD-10-CM

## 2018-05-07 DIAGNOSIS — I2510 Atherosclerotic heart disease of native coronary artery without angina pectoris: Secondary | ICD-10-CM | POA: Diagnosis not present

## 2018-05-07 DIAGNOSIS — R6889 Other general symptoms and signs: Secondary | ICD-10-CM | POA: Diagnosis not present

## 2018-05-07 MED ORDER — IOPAMIDOL (ISOVUE-370) INJECTION 76%
100.0000 mL | Freq: Once | INTRAVENOUS | Status: AC | PRN
  Administered 2018-05-07: 100 mL via INTRAVENOUS

## 2018-05-07 MED ORDER — NITROGLYCERIN 0.4 MG SL SUBL
SUBLINGUAL_TABLET | SUBLINGUAL | Status: AC
  Filled 2018-05-07: qty 2

## 2018-05-07 MED ORDER — NITROGLYCERIN 0.4 MG SL SUBL
0.8000 mg | SUBLINGUAL_TABLET | Freq: Once | SUBLINGUAL | Status: AC
  Administered 2018-05-07: 0.8 mg via SUBLINGUAL
  Filled 2018-05-07: qty 25

## 2018-05-07 NOTE — Progress Notes (Signed)
Pt tolerated exam without incident.  VSS.  Coffee and crackers given to patient post exam.  Pt denies any CP, SOB, dizziness or headache.  PIV removed, pressure held, drsing applied, instructions given to pt for removal of dsg.  Instructed patient on post IV contrast admin care at home.  Pt discharged via wheelchair (arrived to IR holding in wheel chair).

## 2018-05-07 NOTE — Progress Notes (Signed)
Pt denies CP or SOB at this time.  Pt stated she took prescribed one time dose of oral lopressor appx 2 hrs ago.  HR 54, afib.  Pt ready for CTA

## 2018-05-07 NOTE — Discharge Instructions (Addendum)
What You Need to Know About IV Contrast Material °IV contrast material is most often a fluid that is used with some imaging tests. Contrast material is injected into your body through a vein to help your health care providers see your organs and tissues more clearly. It may be used with: °· X-ray. °· MRI. °· CT. °· Ultrasound. °Contrast material is used when your health care providers need a detailed look at organs, tissues, or blood vessels that may not show up with the standard test. IV contrast may be used for imaging tests that examine: °· Muscles, skin, and fat. °· Breasts. °· Brain. °· Digestive tract. °· Heart. °· Liver. °· Lungs and many other internal organs. °What are the risks of using IV contrast material? °The risks of using IV contrast material include: °· Headache. °· Itching, skin rash, and hives. °· Allergic reactions. °· Nausea and vomiting. °· Wheezing or difficulty breathing. °· Abnormal heart rate. °· Blood pressure changes. °· Throat swelling. °· Kidney damage. °These complications are more likely to occur in people who: °· Have kidney failure. °· Have liver problems. °· Have certain heart problems, including: °? Heart failure. °? Heart attack. °? Heart infection. °? Heart valve problems. °· Abuse alcohol. °· Have allergies or asthma. °· Are dehydrated. °· Have sickle cell anemia or similar problems. °· Have had trouble with IV contrast material in the past. °· Take certain medicines, such as: °? Metformin. °? NSAIDs. °? Beta blockers. °? Interleukin-2. °How do I prepare for my test with IV contrast material? °· Follow instructions from your health care provider about eating or drinking restrictions. °· Ask your health care provider about changing or stopping your regular medicines. This is especially important if you are taking diabetes medicines or blood thinners. °· Tell your health care provider about: °? Any previous illnesses, surgeries, or pre-existing medical conditions. °? Whether you  are pregnant or may be pregnant. °? Whether you are breastfeeding. Most contrast agents are safe for use in breastfeeding women. °· You may have a physical exam to determine any potential risks. °· Ask if you will be given a medicine (sedative) to help you relax during the procedure. If so, plan to have someone take you home after test. °What happens during the test with IV contrast material? ° °· You may be given a sedative to help you relax. °· A needle will be inserted into one of your veins to administer the IV contrast material. °· You may feel warmth or flushing as the material enters your bloodstream. °· You may have a metallic taste in your mouth for a few minutes. °· The needle may cause some discomfort and bruising. °· After the contrast material is in your body, the imaging test will be done. °The procedure may vary among health care providers and hospitals. °What happens after the test with IV contrast material? °· You may be asked to drink water or other fluids to wash (flush) the contrast material out of your body. °· Drink enough fluid to keep your urine pale yellow. °· Do not drive for 24 hours if you received a sedative. °· It is your responsibility to get your test results. Ask your health care provider or the department performing the test when your results will be ready. °Contact a health care provider if: °· You have redness, swelling, or pain near your IV site. °Get help right away if: °· You have an abnormal heart rhythm. °· You have trouble breathing. °· You have: °?   Chest pain. °? Pain in your back, neck, arm, jaw, or stomach. °? Nausea or sweating. °? Hives or a rash. °· You start shaking and cannot stop. °These symptoms may represent a serious problem that is an emergency. Do not wait to see if the symptoms will go away. Get medical help right away. Call your local emergency services (911 in the [place] Do not drive yourself to the hospital. °Summary °· IV contrast may be used for imaging  tests to help your health care providers see your organs and tissues more clearly. °· Tell your health care provider if you are pregnant or may be pregnant. °· During the procedure, you may feel warmth or flushing as the material enters your bloodstream. °· After the procedure, drink enough fluid to keep your urine pale yellow. °This information is not intended to replace advice given to you by your health care provider. Make sure you discuss any questions you have with your health care provider. °Document Released: 03/08/2009 Document Revised: 11/12/2017 Document Reviewed: 11/25/2014 °Elsevier Interactive Patient Education © 2019 [employer] ° °

## 2018-05-10 NOTE — Progress Notes (Signed)
@Patient  ID: Sharon Vasquez, female    DOB: 11-28-1950, 68 y.o.   [name]: [id number]  Chief Complaint  Patient presents with  . Follow-up    pt c/o sob with any exertion, difficulty doing daily tasks.  Pt states that she is unknowingly taking cpap off at night.     Referring provider: Neva Seat, MD  HPI: 68 year old female, current some day smoker. PMH morbid obesity, COPD, ILD, OSA(on CPAP), PE/DVT (on xarelto), chronic diastolic heart failure and afib. [name], last seen on 02/13/18. HRCT reviewed visit, ILD felt to be related to some degree of edema, RB ILD or effect of amiodarone. On amiodarone for afib, recently decreased to 100mg  daily per cardiology. Maintained on Advair, Spiriva and prn albuterol neb. Continue smoking cessation discussion.   05/13/2018 Patient presents today for regular scheduled visit and CPAP review. Patient states that she is struggling with her breathing now more than she was before. Continues taking Advair twice daily and Spiriva. She did not take her morning medication prior to appointment today. Struggles with her mobility d/t chronic knee pain. Ambulates with rolling walker. Wearing compression stockings. Occasionally misses Torsemide dose, can feel fluid build up if she doesn't take it. Takes her diuretic in the afternoon, states that she wakes up at night to use the bathroom. She takes her CPAP mask off multiple times a night when waking up. Denies fever, URI symptoms, cough or wheeze.   [facility] download 04/10/18-05/09/18: Usage- 30/30 days (100%); 23 days >4 hours(77%) Pressure 13cm H20 Air leaks - 68L/min (95%) AHI 8.5 * Takes mask off multiple times at night to use BR, likely contributing to airleaks   Significant tests/ events reviewed > HRCT 12/2017 >> Mild interlobular septal thickening and mild patchy peribronchovascular ground-glass opacity in both lungs. Minimal patchy subpleural reticulation in the upper lobes ? Pul edema vs  RB-ILD > PFTs9/2019- No obstruction, mild-mod restriction, decreased DLCO  FVC 2.16 (66%), FEV1 1.93 (75%), ratio 89, DLCOcor 16.45 (48%) > FENO 12/2017 5 > ECHO 07/2016-EF 60-65%, PA peak pressure 6mm Hg > HST 12/2017 >> AHI 20/h  No Known Allergies  Immunization History  Administered Date(s) Administered  . Pneumococcal Conjugate-13 02/01/2017    Past Medical History:  Diagnosis Date  . A-fib ([place])   . Anxiety   . Arthritis    "qwhre; joints, back" (04/17/2017)  . Benign breast cyst in female, left 01/08/2017   Found by Screening mammogram, evaluated by [facility] on 01/08/17 and determined to be a benign simple breast cyst.  . Cellulitis of left lower leg 05/30/2017  . CHF (congestive heart failure) ([place])   . Chronic lower back pain   . Chronic venous insufficiency    [name] 05/30/2017  . COPD (chronic obstructive pulmonary disease) ([place])   . Depression   . DVT (deep venous thrombosis) ([place]) 11/16/2016  . GERD (gastroesophageal reflux disease)   . Headache    "weekly for the last 3 months" (04/17/2017)  . Hyperlipidemia   . Hypertension   . Morbid obesity ([place])   . PE (pulmonary embolism)   . Pulmonary embolism ([place]) 09/21/2014    Tobacco History: Social History   Tobacco Use  Smoking Status Current Some Day Smoker  . Packs/day: 0.10  . Years: 55.00  . Pack years: 5.50  . Types: Cigarettes  . Start date: 08/16/1961  Smokeless Tobacco Never Used  Tobacco Comment   4-5 cigs per day   Ready to quit: Not Answered Counseling given:  Not Answered Comment: 4-5 cigs per day   Outpatient Medications Prior to Visit  Medication Sig Dispense Refill  . albuterol (PROAIR HFA) 108 (90 Base) MCG/ACT inhaler Inhale 2 puffs into the lungs every 6 (six) hours as needed for wheezing or shortness of breath. 1 Inhaler 1  . alendronate (FOSAMAX) 70 MG tablet Take 1 tablet (70 mg total) by mouth every 7 (seven) days. Take with a full glass of water on an empty stomach. 12 tablet 3  .  amiodarone (PACERONE) 200 MG tablet Take 0.5 tablets (100 mg total) by mouth daily. 45 tablet 3  . aspirin EC 81 MG tablet Take 81 mg by mouth daily.    [name] atorvastatin (LIPITOR) 40 MG tablet Take 1 tablet (40 mg total) by mouth daily. 90 tablet 3  . esomeprazole (NEXIUM) 40 MG capsule Take 1 capsule (40 mg total) by mouth daily as needed (for heartburn or indigestion). 90 capsule 3  . Fluticasone-Salmeterol (ADVAIR DISKUS) 250-50 MCG/DOSE AEPB Inhale 1 puff into the lungs 2 (two) times daily. Rinse mouth after each use 60 each 6  . gabapentin (NEURONTIN) 400 MG capsule Take 1 capsule (400 mg total) by mouth 2 (two) times daily. 60 capsule 2  . hydrALAZINE (APRESOLINE) 25 MG tablet Take 1 tablet (25 mg total) by mouth 3 (three) times daily. 90 tablet 11  . ibuprofen (ADVIL,MOTRIN) 800 MG tablet Take 1 tablet (800 mg total) by mouth every 8 (eight) hours as needed. 45 tablet 0  . oxyCODONE-acetaminophen (PERCOCET/ROXICET) 5-325 MG tablet Take 1 tablet by mouth daily as needed for severe pain. 30 tablet 0  . pantoprazole (PROTONIX) 20 MG tablet TAKE 1 TABLET(20 MG) BY MOUTH DAILY 90 tablet 0  . potassium chloride (K-DUR,KLOR-CON) 10 MEQ tablet Take 2 tablets (20 mEq total) by mouth 2 (two) times daily. 60 tablet 11  . rivaroxaban (XARELTO) 20 MG TABS tablet Take 1 tablet (20 mg total) by mouth every morning. 30 tablet 5  . tiotropium (SPIRIVA HANDIHALER) 18 MCG inhalation capsule Place 1 capsule (18 mcg total) into inhaler and inhale daily. 30 capsule 6  . torsemide (DEMADEX) 20 MG tablet Take 2 tablets (40 mg total) by mouth daily. 180 tablet 3  . albuterol (PROVENTIL) (2.5 MG/3ML) 0.083% nebulizer solution Take 3 mLs (2.5 mg total) by nebulization every 4 (four) hours as needed for wheezing or shortness of breath. 75 mL 2  . benazepril (LOTENSIN) 40 MG tablet Take 1 tablet (40 mg total) by mouth daily. 90 tablet 3  . metoprolol tartrate (LOPRESSOR) 100 MG tablet Take 1 tablet (100 mg total) by mouth  once for 1 dose. 1 tablet 0   No facility-administered medications prior to visit.     Review of Systems  Review of Systems  Constitutional: Negative.   HENT: Negative.   Respiratory: Positive for shortness of breath. Negative for apnea, cough, choking, chest tightness, wheezing and stridor.   Cardiovascular: Positive for leg swelling.    Physical Exam  BP (!) 156/88 (BP Location: Left Wrist, Cuff Size: Normal)   Pulse 67   Ht 6\' 2"  (1.88 m)   Wt (!) 348 lb (157.9 kg)   SpO2 96%   BMI 44.68 kg/m  Physical Exam Constitutional:      General: She is not in acute distress.    Appearance: Normal appearance. She is obese. She is not ill-appearing.  HENT:     Head: Normocephalic and atraumatic.     Right Ear: Tympanic membrane normal.  Left Ear: Tympanic membrane normal.     Nose: Nose normal.     Mouth/Throat:     Mouth: Mucous membranes are moist.     Pharynx: Oropharynx is clear.  Eyes:     Extraocular Movements: Extraocular movements intact.     Pupils: Pupils are equal, round, and reactive to light.  Neck:     Musculoskeletal: Normal range of motion and neck supple.  Cardiovascular:     Rate and Rhythm: Normal rate.     Comments: RRR; +2-3 BLE edema  Pulmonary:     Effort: Pulmonary effort is normal. No respiratory distress.     Breath sounds: Wheezing present. No rhonchi.  Musculoskeletal: Normal range of motion.     Comments: Amb with rolling walker  Skin:    General: Skin is warm and dry.  Neurological:     General: No focal deficit present.     Mental Status: She is alert and oriented to person, place, and time. Mental status is at baseline.  Psychiatric:        Mood and Affect: Mood normal.        Behavior: Behavior normal.     Contn Lab Results:  CBC    Component Value Date/Time   WBC 5.4 04/29/2018 1235   WBC 6.0 12/04/2017 2027   RBC 4.31 04/29/2018 1235   RBC 4.71 12/04/2017 2027   HGB 12.4 04/29/2018 1235   HCT 37.2 04/29/2018 1235    PLT 389 04/29/2018 1235   MCV 86 04/29/2018 1235   MCH 28.8 04/29/2018 1235   MCH 29.1 12/04/2017 2027   MCHC 33.3 04/29/2018 1235   MCHC 32.5 12/04/2017 2027   RDW 13.4 04/29/2018 1235   LYMPHSABS 0.7 05/24/2017 1041   MONOABS 0.3 05/24/2017 1041   EOSABS 0.0 05/24/2017 1041   BASOSABS 0.0 05/24/2017 1041    BMET    Component Value Date/Time   NA 142 04/29/2018 1235   K 4.1 04/29/2018 1235   CL 107 (H) 04/29/2018 1235   CO2 21 04/29/2018 1235   GLUCOSE 106 (H) 04/29/2018 1235   GLUCOSE 96 12/04/2017 2027   BUN 17 04/29/2018 1235   CREATININE 1.23 (H) 04/29/2018 1235   CREATININE 1.21 (H) 07/26/2016 1528   CALCIUM 9.1 04/29/2018 1235   GFRNONAA 45 (L) 04/29/2018 1235   GFRAA 52 (L) 04/29/2018 1235    BNP    Component Value Date/Time   BNP 118.0 (H) 04/17/2017 0815    ProBNP No results found for: PROBNP  Imaging: Ct Coronary Morph W/cta Cor W/score W/ca W/cm &/or Wo/cm  Addendum Date: 05/07/2018   ADDENDUM REPORT: 05/07/2018 16:28 CLINICAL DATA:  68 year old female with obesity, hypertension, smoking and atypical chest pain. EXAM: Cardiac/Coronary  CT TECHNIQUE: The patient was scanned on a [employer]. FINDINGS: A 120 kV prospective scan was triggered in the descending thoracic aorta at 111 HU's. Axial non-contrast 3 mm slices were carried out through the heart. The data set was analyzed on a dedicated work station and scored using the [place]. Gantry rotation speed was 250 msecs and collimation was .6 mm. No beta blockade and 0.8 mg of sl NTG was given. The 3D data set was reconstructed in 5% intervals of the 67-82 % of the R-R cycle. Diastolic phases were analyzed on a dedicated work station using MPR, MIP and VRT modes. The patient received 80 cc of contrast. Aorta:  Normal size.  Trivial calcifications.  No dissection. Aortic Valve:  Trileaflet.  No calcifications.  Coronary Arteries:  Normal coronary origin.  Left dominance. Left main is a large artery  that gives rise to LAD and LCX arteries. There is mild calcified plaque with stenosis 25-50%. LAD is a large vessel that has moderate to severe predominantly calcified plaque in the proximal portion with associated stenosis 50-69%. D1 is medium caliber artery that has moderate diffuse calcified plaque with suspicion for a stenosis > 70% in the mid portion. LCX is a large dominant artery that gives rise to one OM1 branch and PDA. PLA is not visualized. There is long diffuse calcified plaque in the distal LCX artery with stenosis 50-69%. OM1 and PDA are poorly visualized secondary to patient's size but appear to have mild diffuse plaque. RCA is a small caliber non-dominant artery that has moderate diffuse calcified plaque with stenosis 50-69%. Other findings: Normal pulmonary vein drainage into the left atrium. Normal let atrial appendage without a thrombus. IMPRESSION: 1. Coronary calcium score of 2110. This was 4 percentile for age and sex matched control. 2. Normal coronary origin with left dominance. 3. Moderate to severe diffuse CAD with stenosis 50-69% in the proximal LAD, distal LCX arteries and suspicion for a severe stenosis in the mid portion of the 1. diagonal artery. Additional analysis with CT FFR will be submitted. 4. Moderately dilated pulmonary artery measuring 37 mm suspicious of pulmonary hypertension. Electronically Signed   By: Ena Dawley   On: 05/07/2018 16:28   Result Date: 05/07/2018 EXAM: OVER-READ INTERPRETATION  CT CHEST The following report is an over-read performed by radiologist Dr. Rolm Baptise of [facility], [place] on 05/07/2018. This over-read does not include interpretation of cardiac or coronary anatomy or pathology. The coronary CTA interpretation by the cardiologist is attached. COMPARISON:  12/31/2017 FINDINGS: Vascular: Heart is mildly enlarged. Calcifications in the aortic root. Aorta is normal caliber. Mediastinum/Nodes: No adenopathy in the lower mediastinum or hila.  Lungs/Pleura: No confluent opacities or effusions. Upper Abdomen: Imaging into the upper abdomen shows no acute findings. Musculoskeletal: Chest wall soft tissues are unremarkable. No acute bony abnormality. IMPRESSION: Mild cardiomegaly. Calcifications in the aortic root. No acute extra cardiac abnormality. Electronically Signed: By: Rolm Baptise M.D. On: 05/07/2018 13:00   Ct Coronary Fractional Flow Reserve Data Prep  Result Date: 05/08/2018 EXAM: CT FFR ANALYSIS FINDINGS: FFRct analysis was performed on the original cardiac CT angiogram dataset. Diagrammatic representation of the FFRct analysis is provided in a separate PDF document in PACS. This dictation was created using the PDF document and an interactive 3D model of the results. 3D model is not available in the EMR/PACS. Normal FFR range is >0.80. 1. Left Main:  No significant stenosis. 2. LAD: No significant stenosis. 3. D1: No significant stenosis. 4. Ramus intermedius: No significant stenosis. 5. LCX: No significant stenosis. 6. RCA: Proximal: 1.0, mid 0.61. IMPRESSION: 1. CT FFR analysis showed significant stenosis in the proximal portion of a small non-dominant RCA. An aggressive medical management is recommended. Electronically Signed   By: Ena Dawley   On: 05/08/2018 14:04     Assessment & Plan:   COPD (chronic obstructive pulmonary disease) ([place]) - No significant obstruction on PFTs; mild-moderate restriction; flow volume loop with mild curvature  - Current some day smoker (1-2 cigarettes daily) - Expiratory wheezes t/o lung fields on exam, pt missed inhaler this morning. Declined office neb treatment. Re-inforced importance of taking as scheduled - Continue Advair 250 twice daily and Spiriva daily. PRN albuterol hfa/nebulizers q4-6hrs for sob/wheeze - Continue to encourage COMPLETE  smoking cessation  - FU in 3-4 months    PAF (paroxysmal atrial fibrillation) (H[facility]) - Continues low dose amiodarone and xarelto per cardiology    OSA (obstructive sleep apnea) - Patient is 100% compliant with CPAP use, AHI 8.5 - Wakes up frequently to use BR and takes mask off (likely contributing to air leak and higher AHI)  - No changes, encouraged patient to take diuretic in am  - FU in 3-4 months    ILD (interstitial lung disease) ([place]) - Felt likely d/t edema, RB ILD or amiodarone - Continue to encourage complete smoking cessation, amiodarone decreased per cards  - Follows with Dr. Candise Che, NP 05/13/2018

## 2018-05-11 DIAGNOSIS — G4733 Obstructive sleep apnea (adult) (pediatric): Secondary | ICD-10-CM | POA: Diagnosis not present

## 2018-05-13 ENCOUNTER — Encounter: Payer: Self-pay | Admitting: Primary Care

## 2018-05-13 ENCOUNTER — Ambulatory Visit (INDEPENDENT_AMBULATORY_CARE_PROVIDER_SITE_OTHER): Payer: Medicare Other | Admitting: Primary Care

## 2018-05-13 DIAGNOSIS — J849 Interstitial pulmonary disease, unspecified: Secondary | ICD-10-CM | POA: Diagnosis not present

## 2018-05-13 DIAGNOSIS — I480 Paroxysmal atrial fibrillation: Secondary | ICD-10-CM | POA: Diagnosis not present

## 2018-05-13 DIAGNOSIS — G4733 Obstructive sleep apnea (adult) (pediatric): Secondary | ICD-10-CM | POA: Diagnosis not present

## 2018-05-13 DIAGNOSIS — R6889 Other general symptoms and signs: Secondary | ICD-10-CM | POA: Diagnosis not present

## 2018-05-13 NOTE — Patient Instructions (Addendum)
COPD: Continue Advair inhaler twice daily and Spiriva once daily  Use Albuterol nebulizer every 4-6 hours as needed for shortness of breath/wheezing  CONTINUE to work on completely stop smoking!!!!! (great job on all your hard work)  Sleep apnea: Continue wearing CPAP every night 4-6 hours or more Do not drive if experiencing excessive fatigue or somnolence Do not take sedating medication or drink alcohol in excess prior to bedtime   Recommendations: PLEASE take your medications when you get home this morning TRY taking diuretic in the morning, this will help you get a better night sleep and decrease the frequency that you wake up Please discuss blood pressure and knee pain with primary care provider   Follow-up: Follow up in 3-4 months with Dr. Elsworth Soho   Please follow up with primary care   Steps to Quit Smoking  Smoking tobacco can be bad for your health. It can also affect almost every organ in your body. Smoking puts you and people around you at risk for many serious long-lasting (chronic) diseases. Quitting smoking is hard, but it is one of the best things that you can do for your health. It is never too late to quit. What are the benefits of quitting smoking? When you quit smoking, you lower your risk for getting serious diseases and conditions. They can include:  Lung cancer or lung disease.  Heart disease.  Stroke.  Heart attack.  Not being able to have children (infertility).  Weak bones (osteoporosis) and broken bones (fractures). If you have coughing, wheezing, and shortness of breath, those symptoms may get better when you quit. You may also get sick less often. If you are pregnant, quitting smoking can help to lower your chances of having a baby of low birth weight. What can I do to help me quit smoking? Talk with your doctor about what can help you quit smoking. Some things you can do (strategies) include:  Quitting smoking totally, instead of slowly cutting back  how much you smoke over a period of time.  Going to in-person counseling. You are more likely to quit if you go to many counseling sessions.  Using resources and support systems, such as: ? Database administrator with a Social worker. ? Phone quitlines. ? Careers information officer. ? Support groups or group counseling. ? Text messaging programs. ? Mobile phone apps or applications.  Taking medicines. Some of these medicines may have nicotine in them. If you are pregnant or breastfeeding, do not take any medicines to quit smoking unless your doctor says it is okay. Talk with your doctor about counseling or other things that can help you. Talk with your doctor about using more than one strategy at the same time, such as taking medicines while you are also going to in-person counseling. This can help make quitting easier. What things can I do to make it easier to quit? Quitting smoking might feel very hard at first, but there is a lot that you can do to make it easier. Take these steps:  Talk to your family and friends. Ask them to support and encourage you.  Call phone quitlines, reach out to support groups, or work with a Social worker.  Ask people who smoke to not smoke around you.  Avoid places that make you want (trigger) to smoke, such as: ? Bars. ? Parties. ? Smoke-break areas at work.  Spend time with people who do not smoke.  Lower the stress in your life. Stress can make you want to smoke. Try these  things to help your stress: ? Getting regular exercise. ? Deep-breathing exercises. ? Yoga. ? Meditating. ? Doing a body scan. To do this, close your eyes, focus on one area of your body at a time from head to toe, and notice which parts of your body are tense. Try to relax the muscles in those areas.  Download or buy apps on your mobile phone or tablet that can help you stick to your quit plan. There are many free apps, such as QuitGuide from the [employer] Office manager for Disease Control and  Prevention). You can find more support from smokefree.gov and other websites. This information is not intended to replace advice given to you by your health care provider. Make sure you discuss any questions you have with your health care provider. Document Released: 01/14/2009 Document Revised: 11/16/2015 Document Reviewed: 08/04/2014 Elsevier Interactive Patient Education  2019 [employer].

## 2018-05-13 NOTE — Assessment & Plan Note (Addendum)
-   Patient is 100% compliant with CPAP use, AHI 8.5 - Wakes up frequently to use BR and takes mask off (likely contributing to air leak and higher AHI)  - No changes, encouraged patient to take diuretic in am  - FU in 3-4 months

## 2018-05-13 NOTE — Assessment & Plan Note (Addendum)
-   Felt likely d/t edema, RB ILD or amiodarone - Continue to encourage complete smoking cessation, amiodarone decreased per cards  - Follows with Dr. Elsworth Soho

## 2018-05-13 NOTE — Assessment & Plan Note (Signed)
-   Continues low dose amiodarone and xarelto per cardiology

## 2018-05-13 NOTE — Assessment & Plan Note (Addendum)
-   No significant obstruction on PFTs; mild-moderate restriction; flow volume loop with mild curvature  - Current some day smoker (1-2 cigarettes daily) - Expiratory wheezes t/o lung fields on exam, pt missed inhaler this morning. Declined office neb treatment. Re-inforced importance of taking as scheduled - Continue Advair 250 twice daily and Spiriva daily. PRN albuterol hfa/nebulizers q4-6hrs for sob/wheeze - Continue to encourage COMPLETE smoking cessation  - FU in 3-4 months

## 2018-05-15 ENCOUNTER — Ambulatory Visit (INDEPENDENT_AMBULATORY_CARE_PROVIDER_SITE_OTHER): Payer: Medicare Other | Admitting: Cardiovascular Disease

## 2018-05-15 ENCOUNTER — Encounter: Payer: Self-pay | Admitting: Cardiovascular Disease

## 2018-05-15 VITALS — BP 162/98 | HR 66 | Ht 74.0 in | Wt 358.0 lb

## 2018-05-15 DIAGNOSIS — Z720 Tobacco use: Secondary | ICD-10-CM

## 2018-05-15 DIAGNOSIS — I10 Essential (primary) hypertension: Secondary | ICD-10-CM | POA: Diagnosis not present

## 2018-05-15 DIAGNOSIS — J432 Centrilobular emphysema: Secondary | ICD-10-CM

## 2018-05-15 DIAGNOSIS — I872 Venous insufficiency (chronic) (peripheral): Secondary | ICD-10-CM

## 2018-05-15 DIAGNOSIS — I480 Paroxysmal atrial fibrillation: Secondary | ICD-10-CM

## 2018-05-15 DIAGNOSIS — I2510 Atherosclerotic heart disease of native coronary artery without angina pectoris: Secondary | ICD-10-CM

## 2018-05-15 DIAGNOSIS — I5032 Chronic diastolic (congestive) heart failure: Secondary | ICD-10-CM

## 2018-05-15 DIAGNOSIS — G4733 Obstructive sleep apnea (adult) (pediatric): Secondary | ICD-10-CM

## 2018-05-15 DIAGNOSIS — R6889 Other general symptoms and signs: Secondary | ICD-10-CM | POA: Diagnosis not present

## 2018-05-15 MED ORDER — METOLAZONE 5 MG PO TABS: 5.0000 mg | ORAL_TABLET | Freq: Every day | ORAL | 3 refills | Status: DC

## 2018-05-15 MED ORDER — HYDRALAZINE HCL 50 MG PO TABS: 50.0000 mg | ORAL_TABLET | Freq: Three times a day (TID) | ORAL | 11 refills | Status: DC

## 2018-05-15 NOTE — Assessment & Plan Note (Signed)
>>  ASSESSMENT AND PLAN FOR DIASTOLIC HEART FAILURE (HCC) WRITTEN ON 05/15/2018 11:18 AM BY BERRY, Delton See, MD  History of diastolic heart failure with 2D echo performed 07/12/2016 revealing normal LV systolic function.  She is on torsemide.  She is gained 20 pounds in the last month and has tense edema in both lower extremities.  I am going to add Zaroxolyn 5 mg a day before her morning torsemide dose.  We will check a basic metabolic panel in 7 days and she will see Kristen back in 2 weeks for review of her weight and her blood work.  She may ultimately need to be admitted for IV diuresis.

## 2018-05-15 NOTE — Progress Notes (Signed)
05/15/2018 Sharon Vasquez   [birth year]  [id number]  Primary Physician Neva Seat, MD Primary Cardiologist: Lorretta Harp MD FACP, [place], [place], [place]  HPI:  Sharon Vasquez is a 68 y.o.  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  I last saw her in the office 04/12/2018. She has a history of treated hypertension hyperlipidemia.  She is had a pulmonary embolus in the past on Xarelto oral anticoagulation.  Sister recently had a myocardial infarction.  She has greater than 100 pack years of tobacco abuse as well and continues to smoke.  She is never had a heart attack or stroke.  She did have a high resolution chest CT performed 12/31/2017 that showed left main/three-vessel coronary calcification.  She does complain of some atypical chest pain.  She recently had a sleep study and was begun on CPAP.  She is aware of salt restriction.  Since I saw her a month ago she had a coronary CTA/FFR which showed a physiologically significant lesion in a small nondominant RCA.  She is also gained 20 pound since I saw her last.  She complains of significant shortness of breath, dyspnea on exertion and orthopnea.  Her last 2D echocardiogram performed 07/12/2016 showed normal LV systolic function.   Current Meds  Medication Sig  . albuterol (PROAIR HFA) 108 (90 Base) MCG/ACT inhaler Inhale 2 puffs into the lungs every 6 (six) hours as needed for wheezing or shortness of breath.  [name] alendronate (FOSAMAX) 70 MG tablet Take 1 tablet (70 mg total) by mouth every 7 (seven) days. Take with a full glass of water on an empty stomach.  [name] amiodarone (PACERONE) 200 MG tablet Take 0.5 tablets (100 mg total) by mouth daily.  [name] aspirin EC 81 MG tablet Take 81 mg by mouth daily.  [name] atorvastatin (LIPITOR) 40 MG tablet Take 1  tablet (40 mg total) by mouth daily.  [name] esomeprazole (NEXIUM) 40 MG capsule Take 1 capsule (40 mg total) by mouth daily as needed (for heartburn or indigestion).  . Fluticasone-Salmeterol (ADVAIR DISKUS) 250-50 MCG/DOSE AEPB Inhale 1 puff into the lungs 2 (two) times daily. Rinse mouth after each use  . gabapentin (NEURONTIN) 400 MG capsule Take 1 capsule (400 mg total) by mouth 2 (two) times daily.  . hydrALAZINE (APRESOLINE) 25 MG tablet Take 1 tablet (25 mg total) by mouth 3 (three) times daily.  [name] ibuprofen (ADVIL,MOTRIN) 800 MG tablet Take 1 tablet (800 mg total) by mouth every 8 (eight) hours as needed.  [name] oxyCODONE-acetaminophen (PERCOCET/ROXICET) 5-325 MG tablet Take 1 tablet by mouth daily as needed for severe pain.  . pantoprazole (PROTONIX) 20 MG tablet TAKE 1 TABLET(20 MG) BY MOUTH DAILY  . potassium chloride (K-DUR,KLOR-CON) 10 MEQ tablet Take 2 tablets (20 mEq total) by mouth 2 (two) times daily.  . rivaroxaban (XARELTO) 20 MG TABS tablet Take 1 tablet (20 mg total) by mouth every morning.  . tiotropium (SPIRIVA HANDIHALER) 18 MCG inhalation capsule Place 1 capsule (18 mcg total) into inhaler and inhale daily.  [name] torsemide (DEMADEX) 20 MG tablet Take 2 tablets (40 mg total) by mouth daily.     No Known Allergies  Social History   Socioeconomic History  . Marital status: Divorced    Spouse name: Not on file  . Number of children:  Not on file  . Years of education: Not on file  . Highest education level: Not on file  Occupational History  . Not on file  Social Needs  . Financial resource strain: Not on file  . Food insecurity:    Worry: Not on file    Inability: Not on file  . Transportation needs:    Medical: Not on file    Non-medical: Not on file  Tobacco Use  . Smoking status: Current Some Day Smoker    Packs/day: 0.10    Years: 55.00    Pack years: 5.50    Types: Cigarettes    Start date: 08/16/1961  . Smokeless tobacco: Never Used  . Tobacco comment: 4-5  cigs per day  Substance and Sexual Activity  . Alcohol use: No  . Drug use: No  . Sexual activity: Not Currently    Partners: Male  Lifestyle  . Physical activity:    Days per week: Not on file    Minutes per session: Not on file  . Stress: Not on file  Relationships  . Social connections:    Talks on phone: Not on file    Gets together: Not on file    Attends religious service: Not on file    Active member of club or organization: Not on file    Attends meetings of clubs or organizations: Not on file    Relationship status: Not on file  . Intimate partner violence:    Fear of current or ex partner: Not on file    Emotionally abused: Not on file    Physically abused: Not on file    Forced sexual activity: Not on file  Other Topics Concern  . Not on file  Social History Narrative   Lives in [facility] senior complex in [place]. Lives alone, but is dependent in ADLs/IADLs. 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 Two daughters live nearby. Previously resided in [place].     Review of Systems: General: negative for chills, fever, night sweats or weight changes.  Cardiovascular: negative for chest pain, dyspnea on exertion, edema, orthopnea, palpitations, paroxysmal nocturnal dyspnea or shortness of breath Dermatological: negative for rash Respiratory: negative for cough or wheezing Urologic: negative for hematuria Abdominal: negative for nausea, vomiting, diarrhea, bright red blood per rectum, melena, or hematemesis Neurologic: negative for visual changes, syncope, or dizziness All other systems reviewed and are otherwise negative except as noted above.    Blood pressure (!) 162/98, pulse 66, height 6\' 2"  (1.88 m), weight (!) 358 lb (162.4 kg).  General appearance: alert and no distress Neck: no adenopathy, no carotid bruit, no JVD, supple, symmetrical, trachea midline and thyroid not enlarged, symmetric, no tenderness/mass/nodules Lungs:  clear to auscultation bilaterally Heart: regular rate and rhythm, S1, S2 normal, no murmur, click, rub or gallop Extremities: Tense edema with venous stasis changes bilaterally Pulses: 2+ and symmetric Skin: Venous stasis changes bilaterally Neurologic: Alert and oriented X 3, normal strength and tone. Normal symmetric reflexes. Normal coordination and gait  EKG sinus rhythm at 66 with septal Q waves, poor R wave progression and left axis deviation with nonspecific IVCD.  I personally reviewed this EKG.  ASSESSMENT AND PLAN:   Benign essential HTN History of essential hypertension blood pressure measured today at 162/98.  She is on hydralazine, and benazepril as well as metoprolol.  I am going to increase her hydralazine from 25 to 50 mg p.o. 3 times daily.  COPD (chronic obstructive pulmonary disease) ([place]) She has COPD.  She does continue to smoke 3 to 4 cigarettes a day down from 2 packs a day.  (HFpEF) heart failure with preserved ejection fraction (HCC) History of diastolic heart failure with 2D echo performed 07/12/2016 revealing normal LV systolic function.  She is on torsemide.  She is gained 20 pounds in the last month and has tense edema in both lower extremities.  I am going to add Zaroxolyn 5 mg a day before her morning torsemide dose.  We will check a basic metabolic panel in 7 days and she will see Kristen back in 2 weeks for review of her weight and her blood work.  She may ultimately need to be admitted for IV diuresis.  PAF (paroxysmal atrial fibrillation) (HCC) History of paroxysmal atrial fibrillation maintaining normal sinus rhythm on amiodarone and Xarelto.  Tobacco use History of ongoing tobacco abuse currently smoking 4 cigarettes a day down from 2 packs a day.  Chronic venous insufficiency History of chronic venous insufficiency with venous stasis changes.  OSA (obstructive sleep apnea) History of obstructive sleep apnea on CPAP.  Coronary artery calcification  seen on CT scan History of coronary atherosclerosis on chest CT with recent CT FFR showing a physiologically significant stenosis in a small nondominant RCA.  Aggressive risk factor modification was recommended.      Lorretta Harp MD FACP,FACC,FAHA, [facility] 05/15/2018 11:21 AM

## 2018-05-15 NOTE — Assessment & Plan Note (Signed)
She has COPD.  She does continue to smoke 3 to 4 cigarettes a day down from 2 packs a day.

## 2018-05-15 NOTE — Assessment & Plan Note (Signed)
History of chronic venous insufficiency with venous stasis changes.

## 2018-05-15 NOTE — Assessment & Plan Note (Signed)
History of essential hypertension blood pressure measured today at 162/98.  She is on hydralazine, and benazepril as well as metoprolol.  I am going to increase her hydralazine from 25 to 50 mg p.o. 3 times daily.

## 2018-05-15 NOTE — Assessment & Plan Note (Signed)
History of coronary atherosclerosis on chest CT with recent CT FFR showing a physiologically significant stenosis in a small nondominant RCA.  Aggressive risk factor modification was recommended.

## 2018-05-15 NOTE — Assessment & Plan Note (Signed)
History of paroxysmal atrial fibrillation maintaining normal sinus rhythm on amiodarone and Xarelto.

## 2018-05-15 NOTE — Assessment & Plan Note (Signed)
History of ongoing tobacco abuse currently smoking 4 cigarettes a day down from 2 packs a day.

## 2018-05-15 NOTE — Assessment & Plan Note (Signed)
History of diastolic heart failure with 2D echo performed 07/12/2016 revealing normal LV systolic function.  She is on torsemide.  She is gained 20 pounds in the last month and has tense edema in both lower extremities.  I am going to add Zaroxolyn 5 mg a day before her morning torsemide dose.  We will check a basic metabolic panel in 7 days and she will see Kristen back in 2 weeks for review of her weight and her blood work.  She may ultimately need to be admitted for IV diuresis.

## 2018-05-15 NOTE — Assessment & Plan Note (Signed)
History of obstructive sleep apnea on CPAP. 

## 2018-05-15 NOTE — Patient Instructions (Addendum)
Medication Instructions:  Your physician has recommended you make the following change in your medication:   INCREASE YOUR HYDRALAZINE (APRESOLINE) TO 50 MG THREE TIMES A DAY.  START ZAROXOLYN (METOLAZONE) 5 MG DAILY. TAKE THIS MEDICATION 30 MINUTES BEFORE YOU TAKE YOUR DAILY TORSEMIDE (DEMADEX)   If you need a refill on your cardiac medications before your next appointment, please call your pharmacy.   Lab work: Your physician recommends that you return for lab work in: 7 DAYS; BMET  If you have labs (blood work) drawn today and your tests are completely normal, you will receive your results only by: [name] MyChart Message (if you have MyChart) OR . A paper copy in the mail If you have any lab test that is abnormal or we need to change your treatment, we will call you to review the results.  Testing/Procedures: Your physician has requested that you have an echocardiogram. Echocardiography is a painless test that uses sound waves to create images of your heart. It provides your doctor with information about the size and shape of your heart and how well your heart's chambers and valves are working. This procedure takes approximately one hour. There are no restrictions for this procedure.   Follow-Up: At [facility], you and your health needs are our priority.  As part of our continuing mission to provide you with exceptional heart care, we have created designated Provider Care Teams.  These Care Teams include your primary Cardiologist (physician) and Advanced Practice Providers (APPs -  Physician Assistants and Nurse Practitioners) who all work together to provide you with the care you need, when you need it. . You will need a follow up appointment in 1 month with an APP and 3 months with Dr. Gwenlyn Found. You may see one of the following Advanced Practice Providers on your designated Care Team:   . Kerin Ransom, [place] . Almyra Deforest, PA-C . Fabian Sharp, PA-C . Jory Sims, DNP . Rosaria Ferries,  PA-C . Roby Lofts, PA-C . Sande Rives, PA-C  Any Other Special Instructions Will Be Listed Below (If Applicable). PLEASE SCHEDULE A FOLLOW UP APPOINTMENT WITH A HEARTCARE CLINICAL PHARMACIST IN 2 WEEKS FOR BLOOD PRESSURE MANAGEMENT, WEIGHT MANAGEMENT, AND A REPEAT OF YOUR BMET LAB WORK

## 2018-05-16 ENCOUNTER — Telehealth: Payer: Self-pay | Admitting: Pulmonary Disease

## 2018-05-16 NOTE — Telephone Encounter (Signed)
Called [facility] and spoke with Levada Dy. I was placed on a 5+ min hold. Will try back.

## 2018-05-17 NOTE — Telephone Encounter (Signed)
Called [facility], they are currently closed. Will call back.

## 2018-05-20 NOTE — Telephone Encounter (Signed)
Called and left message with Sonia Baller [facility] to call back.  Per Patient, [facility] needs fax number to fax CPAP report.

## 2018-05-21 ENCOUNTER — Other Ambulatory Visit: Payer: Self-pay

## 2018-05-21 NOTE — Telephone Encounter (Signed)
Needs to speak with a nurse about meds. Please call back.  

## 2018-05-21 NOTE — Telephone Encounter (Signed)
Returned call to patient, VM obtained , left message to call back. SChaplin, RN,BSN

## 2018-05-21 NOTE — Telephone Encounter (Signed)
ATC [facility], I have left a message with Levada Dy to follow up on this matter.

## 2018-05-22 NOTE — Telephone Encounter (Signed)
Called [facility] and spoke with Shelina with the CPAP team  She states that she can not see that anything is needed from our office  [facility] for the pt

## 2018-05-23 ENCOUNTER — Ambulatory Visit: Payer: Medicare Other | Admitting: Pulmonary Disease

## 2018-05-23 NOTE — Telephone Encounter (Signed)
ATC Patient.  Left message on VM to call back when available .

## 2018-05-24 ENCOUNTER — Other Ambulatory Visit (HOSPITAL_COMMUNITY): Payer: Medicare Other

## 2018-05-24 NOTE — Telephone Encounter (Signed)
LMTCB x3 for pt.  

## 2018-05-26 DIAGNOSIS — M170 Bilateral primary osteoarthritis of knee: Secondary | ICD-10-CM | POA: Diagnosis not present

## 2018-05-27 NOTE — Telephone Encounter (Signed)
Left message for patient to call back.   Will close this message since this was the 4th message for the patient.

## 2018-05-30 ENCOUNTER — Ambulatory Visit (INDEPENDENT_AMBULATORY_CARE_PROVIDER_SITE_OTHER): Payer: Medicare Other | Admitting: Internal Medicine

## 2018-05-30 ENCOUNTER — Encounter: Payer: Self-pay | Admitting: Internal Medicine

## 2018-05-30 VITALS — BP 173/99 | HR 72 | Temp 98.2°F | Wt 343.1 lb

## 2018-05-30 DIAGNOSIS — I10 Essential (primary) hypertension: Secondary | ICD-10-CM

## 2018-05-30 DIAGNOSIS — I480 Paroxysmal atrial fibrillation: Secondary | ICD-10-CM

## 2018-05-30 DIAGNOSIS — Z7901 Long term (current) use of anticoagulants: Secondary | ICD-10-CM

## 2018-05-30 DIAGNOSIS — J432 Centrilobular emphysema: Secondary | ICD-10-CM

## 2018-05-30 DIAGNOSIS — G894 Chronic pain syndrome: Secondary | ICD-10-CM

## 2018-05-30 DIAGNOSIS — Z79891 Long term (current) use of opiate analgesic: Secondary | ICD-10-CM

## 2018-05-30 MED ORDER — OXYCODONE-ACETAMINOPHEN 5-325 MG PO TABS: 1.0000 | ORAL_TABLET | Freq: Every day | ORAL | 0 refills | Status: DC | PRN

## 2018-05-30 MED ORDER — IBUPROFEN 800 MG PO TABS: 800.0000 mg | ORAL_TABLET | Freq: Three times a day (TID) | ORAL | 0 refills | Status: DC | PRN

## 2018-05-30 MED ORDER — ALBUTEROL SULFATE HFA 108 (90 BASE) MCG/ACT IN AERS: 2.0000 | INHALATION_SPRAY | Freq: Four times a day (QID) | RESPIRATORY_TRACT | 1 refills | Status: DC | PRN

## 2018-05-30 NOTE — Telephone Encounter (Signed)
Lm for rtc 

## 2018-05-30 NOTE — Progress Notes (Signed)
   CC: Hypertension, Atrial Fibrillation, Chronic Pain   HPI:  [name] is a 68 y.o. F with PMHx listed below presenting for Hypertension, Atrial Fibrillation, Chronic Pain. Please see the A&P for the status of the patient's chronic medical problems.  Past Medical History:  Diagnosis Date  . A-fib ([place])   . Anxiety   . Arthritis    "qwhre; joints, back" (04/17/2017)  . Benign breast cyst in female, left 01/08/2017   Found by Screening mammogram, evaluated by U/S on 01/08/17 and determined to be a benign simple breast cyst.  . Cellulitis of left lower leg 05/30/2017  . CHF (congestive heart failure) ([place])   . Chronic lower back pain   . Chronic venous insufficiency    [name] 05/30/2017  . COPD (chronic obstructive pulmonary disease) ([place])   . Depression   . DVT (deep venous thrombosis) ([place]) 11/16/2016  . GERD (gastroesophageal reflux disease)   . Headache    "weekly for the last 3 months" (04/17/2017)  . Hyperlipidemia   . Hypertension   . Morbid obesity ([place])   . PE (pulmonary embolism)   . Pulmonary embolism ([place]) 09/21/2014   Review of Systems:  Performed and all others negative.  Physical Exam:  Vitals:   05/30/18 1435  BP: (!) 173/99  Pulse: 72  Temp: 98.2 F (36.8 C)  TempSrc: Oral  SpO2: 98%  Weight: (!) 343 lb 1.6 oz (155.6 kg)   Physical Exam Constitutional:      Appearance: She is well-developed. She is obese.     Comments: Mild distress due to chronic pain  Cardiovascular:     Rate and Rhythm: Normal rate and regular rhythm.     Heart sounds: Normal heart sounds.  Pulmonary:     Effort: Pulmonary effort is normal. No respiratory distress.     Breath sounds: Normal breath sounds.     Comments: Distant breath sounds Abdominal:     General: Bowel sounds are normal. There is no distension.     Palpations: Abdomen is soft.     Tenderness: There is no abdominal tenderness.  Musculoskeletal:        General: No deformity.     Comments: Chronic  Bilateral LE Edema  Skin:    General: Skin is warm and dry.    Assessment & Plan:   See Encounters Tab for problem based charting.  Patient discussed with Dr. Eppie Gibson

## 2018-05-30 NOTE — Patient Instructions (Addendum)
Thank you for allowing [place] to care for you  For your pain - Will will provide another short supply of pain medication while you await changes in your insurance for coverage of your pain clinic visits - We will provide a referral to orthopedics for reevaluation of your knees  For your High blood pressure - Please take your metoprolol, Hydralazine, Benazepril, and Torsemide everyday - Take your Metolazone 60min before your morning Torsemide every other day. You can hold this for low blood pressures causing symptoms and resume when blood pressure improves  Refills Provided today, please call if you need additional refills  Please follow up in about 3 months

## 2018-05-31 ENCOUNTER — Encounter: Payer: Self-pay | Admitting: Internal Medicine

## 2018-05-31 MED ORDER — BENAZEPRIL HCL 40 MG PO TABS: 40.0000 mg | ORAL_TABLET | Freq: Every day | ORAL | 3 refills | Status: DC

## 2018-05-31 MED ORDER — METOPROLOL TARTRATE 100 MG PO TABS: 100.0000 mg | ORAL_TABLET | Freq: Once | ORAL | 0 refills | Status: DC

## 2018-05-31 NOTE — Assessment & Plan Note (Signed)
Patient previously followed with pain management, but has been unable to afford the reported $50 per visit co-pay. She states she is working on changes to her insurance that will add medicaid coverage and will hopefully cover her pain clinic visits. Will provide temporary prescription for percocet for pain control while she work to get back to the pain clinic.  - Oxycodone 5-325mg  Daily PRN, #30 - Ibuprofen 800mg  q8h PRN

## 2018-05-31 NOTE — Assessment & Plan Note (Signed)
BP today 173/99. She has had some recent medication changes by her cardiologist, Dr. Gwenlyn Found, but she has not been taking her medications as prescribed. Her hydralazine was increased to 50mg  TID and Metolazone was added (before AM torsemide dose).  She states that she has been taking her lasix intermittently (about 3-4 time per weeks) as she finds it difficult to go about her day with frequent urination ans she has been taking the metolazone less as she report low blood pressure (down to 03B-048G systolic) on her home cuff with symptoms of light headedness.  The importance of sticking to a regular regimen to allow her providers to know how that regimen is affecting her blood pressure and to be able to make informed dicisions when adjusting her medications. The other reason for the addition of metolazone was to improve diuresis given her weight gain in order to avoid admission for diuresis. This is important because having her blood pressure and heart failure under better control will better help her pursue her goals of weight loss and and potential surgery for her knees down the road.  In order to encourage adherence, patient instructed to take Metolazone 77min before her Torsemide every other day and hold it if she has symptoms or hypotension and the resume this regimen when her blood pressure returns to normal. She agrees to this plan and I hope she is able to do so. - Benazepril 40mg  Daily (Refilled) - Metoprolol 100mg  Daily (Refilled) - Hydralazine 50mg  TID - Torsemide 40mg  Daily, qAM - Metolazone 5mg , 61min before Torsemide, QOD (Hold for symptomatic hypotension) - Follow up with Cards as scheduled

## 2018-05-31 NOTE — Assessment & Plan Note (Addendum)
She has Paroxysmal Atrial Fibrillation. RRR today. On Xarelto and Amiodarone. Amiodarone dose reduced in the past year to 100mg  Daily. Last TSH WNL, DLCO reduced (monitored by pulmonology). - Xarelto 20mg  Daily - Amiodarone 100mg  Daily

## 2018-06-01 NOTE — Progress Notes (Signed)
Case discussed with Dr. Melvin at the time of the visit.  We reviewed the resident's history and exam and pertinent patient test results.  I agree with the assessment, diagnosis and plan of care documented in the resident's note. 

## 2018-06-03 ENCOUNTER — Other Ambulatory Visit: Payer: Self-pay | Admitting: Internal Medicine

## 2018-06-03 DIAGNOSIS — I10 Essential (primary) hypertension: Secondary | ICD-10-CM

## 2018-06-04 ENCOUNTER — Ambulatory Visit (HOSPITAL_COMMUNITY): Payer: Medicare Other | Attending: Cardiovascular Disease

## 2018-06-04 DIAGNOSIS — I5032 Chronic diastolic (congestive) heart failure: Secondary | ICD-10-CM | POA: Insufficient documentation

## 2018-06-06 ENCOUNTER — Ambulatory Visit: Payer: Medicare Other

## 2018-06-06 MED ORDER — METOPROLOL TARTRATE 100 MG PO TABS: 100.0000 mg | ORAL_TABLET | Freq: Every day | ORAL | 3 refills | Status: DC

## 2018-06-06 NOTE — Telephone Encounter (Signed)
Refill approved. It typically is twice a day, but patient has only been intermittently taking all of her medications and I would like to see how her BP, HR stabilize while taking her medications consistently before making further changes.

## 2018-06-06 NOTE — Addendum Note (Signed)
Addended by: Neva Seat B on: 06/06/2018 01:21 PM   Modules accepted: Orders

## 2018-06-06 NOTE — Telephone Encounter (Signed)
Refill Sig Updated to reflect this medication is a daily medication.

## 2018-06-09 DIAGNOSIS — G4733 Obstructive sleep apnea (adult) (pediatric): Secondary | ICD-10-CM | POA: Diagnosis not present

## 2018-06-09 DIAGNOSIS — M170 Bilateral primary osteoarthritis of knee: Secondary | ICD-10-CM | POA: Diagnosis not present

## 2018-06-10 ENCOUNTER — Other Ambulatory Visit: Payer: Self-pay

## 2018-06-10 DIAGNOSIS — I5032 Chronic diastolic (congestive) heart failure: Secondary | ICD-10-CM

## 2018-06-10 DIAGNOSIS — I2699 Other pulmonary embolism without acute cor pulmonale: Secondary | ICD-10-CM

## 2018-06-14 ENCOUNTER — Ambulatory Visit: Payer: Medicare Other | Admitting: Physician Assistant

## 2018-06-24 DIAGNOSIS — M170 Bilateral primary osteoarthritis of knee: Secondary | ICD-10-CM | POA: Diagnosis not present

## 2018-06-28 ENCOUNTER — Other Ambulatory Visit: Payer: Self-pay | Admitting: Internal Medicine

## 2018-06-28 DIAGNOSIS — G894 Chronic pain syndrome: Secondary | ICD-10-CM

## 2018-06-28 MED ORDER — OXYCODONE-ACETAMINOPHEN 5-325 MG PO TABS: 1.0000 | ORAL_TABLET | Freq: Every day | ORAL | 0 refills | Status: DC | PRN

## 2018-06-28 NOTE — Addendum Note (Signed)
Addended by: Neva Seat B on: 06/28/2018 07:55 PM   Modules accepted: Orders

## 2018-06-28 NOTE — Telephone Encounter (Signed)
Pt rtn call about her Medication refill.

## 2018-06-28 NOTE — Telephone Encounter (Signed)
Need refill on oxyCODONE-acetaminophen (PERCOCET/ROXICET) 5-325 MG tablet  [id number] - [place], [place] - [place] ;pt contact [phone]

## 2018-06-28 NOTE — Telephone Encounter (Signed)
Refill approved for 1 month without visit given current COVID-19 Pandemic and patient being high risk for significant illness if exposed. Will re-evaluate in 1 month. She was to work on Insurance underwriter and finding a new pain clinic after last visit, but this is like delayed due to Pandemic as well.  Pearson Grippe

## 2018-07-10 DIAGNOSIS — M170 Bilateral primary osteoarthritis of knee: Secondary | ICD-10-CM | POA: Diagnosis not present

## 2018-07-10 DIAGNOSIS — G4733 Obstructive sleep apnea (adult) (pediatric): Secondary | ICD-10-CM | POA: Diagnosis not present

## 2018-07-16 ENCOUNTER — Ambulatory Visit: Payer: Medicare Other | Admitting: Cardiovascular Disease

## 2018-07-18 ENCOUNTER — Encounter: Payer: Medicare Other | Admitting: Internal Medicine

## 2018-07-24 ENCOUNTER — Other Ambulatory Visit: Payer: Self-pay

## 2018-07-24 DIAGNOSIS — G894 Chronic pain syndrome: Secondary | ICD-10-CM

## 2018-07-24 NOTE — Telephone Encounter (Signed)
oxyCODONE-acetaminophen (PERCOCET/ROXICET) 5-325 MG tablet   Refill request @  [facility] #[id number] [address] [place], [place] - [place] [phone] (Phone) [phone] (Fax)

## 2018-07-25 DIAGNOSIS — M170 Bilateral primary osteoarthritis of knee: Secondary | ICD-10-CM | POA: Diagnosis not present

## 2018-07-25 MED ORDER — OXYCODONE-ACETAMINOPHEN 5-325 MG PO TABS: 1.0000 | ORAL_TABLET | Freq: Every day | ORAL | 0 refills | Status: DC | PRN

## 2018-07-25 NOTE — Telephone Encounter (Signed)
Refill again approved for 1 month without visit given current COVID-19 Pandemic and patient being high risk for significant illness if exposed. Will re-evaluate in 1 month. She was to work on Insurance underwriter and finding a new pain clinic after last visit, but this has been delayed due to Pandemic.  Pearson Grippe

## 2018-08-07 ENCOUNTER — Other Ambulatory Visit: Payer: Self-pay

## 2018-08-07 DIAGNOSIS — I480 Paroxysmal atrial fibrillation: Secondary | ICD-10-CM

## 2018-08-07 DIAGNOSIS — I739 Peripheral vascular disease, unspecified: Secondary | ICD-10-CM

## 2018-08-07 MED ORDER — ESOMEPRAZOLE MAGNESIUM 40 MG PO CPDR: 40.0000 mg | DELAYED_RELEASE_CAPSULE | Freq: Every day | ORAL | 1 refills | Status: DC | PRN

## 2018-08-07 MED ORDER — RIVAROXABAN 20 MG PO TABS: 20.0000 mg | ORAL_TABLET | ORAL | 5 refills | Status: DC

## 2018-08-07 NOTE — Telephone Encounter (Signed)
Refills approved.

## 2018-08-07 NOTE — Telephone Encounter (Signed)
rivaroxaban (XARELTO) 20 MG TABS tablet  esomeprazole (NEXIUM) 40 MG capsule, refill request @  [facility] #[id number] - [place] Vas[place], [place] - [place] [phone] (Phone) [phone] (Fax)

## 2018-08-09 DIAGNOSIS — G4733 Obstructive sleep apnea (adult) (pediatric): Secondary | ICD-10-CM | POA: Diagnosis not present

## 2018-08-09 DIAGNOSIS — M170 Bilateral primary osteoarthritis of knee: Secondary | ICD-10-CM | POA: Diagnosis not present

## 2018-08-13 ENCOUNTER — Ambulatory Visit: Payer: Medicare Other | Admitting: Cardiovascular Disease

## 2018-08-15 DIAGNOSIS — G4733 Obstructive sleep apnea (adult) (pediatric): Secondary | ICD-10-CM | POA: Diagnosis not present

## 2018-08-21 ENCOUNTER — Telehealth: Payer: Self-pay | Admitting: Cardiovascular Disease

## 2018-08-21 NOTE — Telephone Encounter (Signed)
Mych[place], smartphone, consent (verbal), pre reg complete 08/21/18 AF

## 2018-08-23 ENCOUNTER — Encounter: Payer: Self-pay | Admitting: Cardiovascular Disease

## 2018-08-23 ENCOUNTER — Telehealth: Payer: Self-pay

## 2018-08-23 ENCOUNTER — Telehealth (INDEPENDENT_AMBULATORY_CARE_PROVIDER_SITE_OTHER): Payer: Medicare Other | Admitting: Cardiovascular Disease

## 2018-08-23 DIAGNOSIS — I2510 Atherosclerotic heart disease of native coronary artery without angina pectoris: Secondary | ICD-10-CM

## 2018-08-23 DIAGNOSIS — G4733 Obstructive sleep apnea (adult) (pediatric): Secondary | ICD-10-CM

## 2018-08-23 DIAGNOSIS — J439 Emphysema, unspecified: Secondary | ICD-10-CM

## 2018-08-23 DIAGNOSIS — Z86711 Personal history of pulmonary embolism: Secondary | ICD-10-CM

## 2018-08-23 DIAGNOSIS — Z7901 Long term (current) use of anticoagulants: Secondary | ICD-10-CM

## 2018-08-23 DIAGNOSIS — Z720 Tobacco use: Secondary | ICD-10-CM

## 2018-08-23 DIAGNOSIS — I5032 Chronic diastolic (congestive) heart failure: Secondary | ICD-10-CM | POA: Diagnosis not present

## 2018-08-23 DIAGNOSIS — I10 Essential (primary) hypertension: Secondary | ICD-10-CM | POA: Diagnosis not present

## 2018-08-23 DIAGNOSIS — I480 Paroxysmal atrial fibrillation: Secondary | ICD-10-CM

## 2018-08-23 DIAGNOSIS — E782 Mixed hyperlipidemia: Secondary | ICD-10-CM

## 2018-08-23 NOTE — Progress Notes (Signed)
Virtual Visit via Telephone Note   This visit type was conducted due to national recommendations for restrictions regarding the COVID-19 Pandemic (e.g. social distancing) in an effort to limit this patient's exposure and mitigate transmission in our community.  Due to her co-morbid illnesses, this patient is at least at moderate risk for complications without adequate follow up.  This format is felt to be most appropriate for this patient at this time.  The patient did not have access to video technology/had technical difficulties with video requiring transitioning to audio format only (telephone).  All issues noted in this document were discussed and addressed.  No physical exam could be performed with this format.  Please refer to the patient's chart for her  consent to telehealth for [facility]   Date:  08/23/2018   ID:  Sharon Vasquez, DOB 07-09-1950, [name] [id number]  Patient Location: Home Provider Location: Home  PCP:  Neva Seat, MD  Cardiologist:  Quay Burow, MD  Electrophysiologist:  None   Evaluation Performed:  Follow-Up Visit  Chief Complaint: Diastolic heart failure and hypertension  History of Present Illness:    Sharon Vasquez is a 68 y.o.  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  I last saw her in the office 05/15/2018.She has a history of treated hypertension hyperlipidemia. She is had a pulmonary embolus in the past on Xarelto oral anticoagulation. Sister recently had a myocardial infarction. She has greater than 100 pack years of tobacco abuse as well and continues to smoke. She is never had a heart attack or stroke. She did have a high resolution chest CT performed 12/31/2017 that showed left main/three-vessel coronary calcification. She does complain  of some atypical chest pain. She recently had a sleep study and was begun on CPAP. She is aware of salt restriction.   She had a coronary CTA/FFR which showed a physiologically significant lesion in a small nondominant RCA.  She is also gained 20 pound since I saw her last.  She complains of significant shortness of breath, dyspnea on exertion and orthopnea.  Her last 2D echocardiogram performed 07/12/2016 showed normal LV systolic function.  Since I saw her 3 months ago she has remained stable.  She says that she has less swelling since she is watching her salt intake.  Her breathing is better.  She is sheltering in place and socially distancing.  She basically does not get out of the house.  She is still smoking however.  She remains on her diuretics as well as her Xarelto.  Her last 2D echo performed 06/04/2018 revealed normal LV systolic function with diastolic dysfunction.  The patient does not have symptoms concerning for COVID-19 infection (fever, chills, cough, or new shortness of breath).    Past Medical History:  Diagnosis Date   A-fib [facility])    Anxiety    Arthritis    "qwhre; joints, back" (04/17/2017)   Benign breast cyst in female, left 01/08/2017   Found by Screening mammogram, evaluated by U/S on 01/08/17 and determined to be a benign simple breast cyst.   Cellulitis of left lower leg 05/30/2017   CHF (congestive heart failure) ([place])    Chronic lower back pain    Chronic venous insufficiency    /notes 05/30/2017   COPD (chronic obstructive pulmonary disease) ([place])  Depression    DVT (deep venous thrombosis) ([place]) 11/16/2016   GERD (gastroesophageal reflux disease)    Headache    "weekly for the last 3 months" (04/17/2017)   Hyperlipidemia    Hypertension    Morbid obesity ([place])    PE (pulmonary embolism)    Pulmonary embolism ([place]) 09/21/2014   Past Surgical History:  Procedure Laterality Date   ABDOMINAL HYSTERECTOMY     APPENDECTOMY     BREAST  CYST EXCISION Left    "six o'clock"   BREAST LUMPECTOMY Left    CHOLECYSTECTOMY     DILATION AND CURETTAGE OF UTERUS     TONSILLECTOMY AND ADENOIDECTOMY     TUBAL LIGATION       No outpatient medications have been marked as taking for the 08/23/18 encounter (Appointment) with Lorretta Harp, MD.     Allergies:   Patient has no known allergies.   Social History   Tobacco Use   Smoking status: Current Some Day Smoker    Packs/day: 0.10    Years: 55.00    Pack years: 5.50    Types: Cigarettes    Start date: 08/16/1961   Smokeless tobacco: Never Used   Tobacco comment: 4-5 cigs per day  Substance Use Topics   Alcohol use: No   Drug use: No     Family Hx: The patient's family history includes Breast cancer in her mother; Hyperlipidemia in her maternal grandfather and mother; Hypertension in her maternal grandfather and mother.  ROS:   Please see the history of present illness.     All other systems reviewed and are negative.   Prior CV studies:   The following studies were reviewed today:  2D echo performed 06/04/2018  Labs/Other Tests and Data Reviewed:    EKG:  No ECG reviewed.  Recent Labs: 04/29/2018: BUN 17; Creatinine, Ser 1.23; Hemoglobin 12.4; Platelets 389; Potassium 4.1; Sodium 142   Recent Lipid Panel Lab Results  Component Value Date/Time   CHOL 154 07/19/2017 10:17 AM   TRIG 99 07/19/2017 10:17 AM   HDL 48 07/19/2017 10:17 AM   CHOLHDL 3.2 07/19/2017 10:17 AM   LDLCALC 86 07/19/2017 10:17 AM    Wt Readings from Last 3 Encounters:  05/30/18 (!) 343 lb 1.6 oz (155.6 kg)  05/15/18 (!) 358 lb (162.4 kg)  05/13/18 (!) 348 lb (157.9 kg)     Objective:    Vital Signs:  There were no vitals taken for this visit.   VITAL SIGNS:  reviewed a complete physical exam was not performed today since this was a virtual telemedicine phone visit  ASSESSMENT & PLAN:    1. Diastolic heart failure chronic- history of chronic diastolic dysfunction on  torsemide and Lasix.  She is aware of her salt intake.  Her lower extremity edema has improved and her breathing is somewhat better. 2. Tobacco abuse- continues to smoke, and is recalcitrant towards factor modification 3. Essential hypertension- she was unable to check her blood pressure today at home.  She is on hydralazine, metoprolol and Lotensin 4. Hyperlipidemia- history of hyperlipidemia on atorvastatin with lipid profile performed 07/19/2017 revealing total cholesterol 154, LDL of 86 and HDL 58 5. Obstructive sleep apnea-on CPAP  COVID-19 Education: The signs and symptoms of COVID-19 were discussed with the patient and how to seek care for testing (follow up with PCP or arrange E-visit).  The importance of social distancing was discussed today.  Time:   Today, I have spent 6 minutes with the patient  with telehealth technology discussing the above problems.     Medication Adjustments/Labs and Tests Ordered: Current medicines are reviewed at length with the patient today.  Concerns regarding medicines are outlined above.   Tests Ordered: No orders of the defined types were placed in this encounter.   Medication Changes: No orders of the defined types were placed in this encounter.   Disposition:  Follow up in 6 month(s)  Signed, Quay Burow, MD  08/23/2018 10:54 AM    [facility]

## 2018-08-23 NOTE — Patient Instructions (Signed)
Medication Instructions:  Your physician recommends that you continue on your current medications as directed. Please refer to the Current Medication list given to you today.  If you need a refill on your cardiac medications before your next appointment, please call your pharmacy.   Lab work: NONE If you have labs (blood work) drawn today and your tests are completely normal, you will receive your results only by: [name] MyChart Message (if you have MyChart) OR . A paper copy in the mail If you have any lab test that is abnormal or we need to change your treatment, we will call you to review the results.  Testing/Procedures: NONE  Follow-Up: At [facility], you and your health needs are our priority.  As part of our continuing mission to provide you with exceptional heart care, we have created designated Provider Care Teams.  These Care Teams include your primary Cardiologist (physician) and Advanced Practice Providers (APPs -  Physician Assistants and Nurse Practitioners) who all work together to provide you with the care you need, when you need it. . You will need a follow up appointment in 6 months with an APP and in 12 months with Dr. Gwenlyn Found.  Please call our office 2 months in advance to schedule this appointment.  You may see one of the following Advanced Practice Providers on your designated Care Team:   . Kerin Ransom, [place] . Almyra Deforest, PA-C . Fabian Sharp, PA-C . Jory Sims, DNP . Rosaria Ferries, PA-C . Roby Lofts, PA-C . Sande Rives, PA-C

## 2018-08-23 NOTE — Telephone Encounter (Signed)
Left message of AVS instructions to f/u in 6 mos with an APP and in 12 mos with Dr. Gwenlyn Found. Letter including After Visit Summary and any other necessary documents to be mailed to the patient's address on file.

## 2018-08-24 DIAGNOSIS — M170 Bilateral primary osteoarthritis of knee: Secondary | ICD-10-CM | POA: Diagnosis not present

## 2018-08-29 ENCOUNTER — Encounter: Payer: Self-pay | Admitting: Internal Medicine

## 2018-08-29 ENCOUNTER — Other Ambulatory Visit: Payer: Self-pay

## 2018-08-29 ENCOUNTER — Ambulatory Visit (INDEPENDENT_AMBULATORY_CARE_PROVIDER_SITE_OTHER): Payer: Medicare Other | Admitting: Internal Medicine

## 2018-08-29 VITALS — BP 158/90 | HR 68 | Temp 98.9°F | Ht 74.0 in | Wt 334.1 lb

## 2018-08-29 DIAGNOSIS — I739 Peripheral vascular disease, unspecified: Secondary | ICD-10-CM

## 2018-08-29 DIAGNOSIS — Z7901 Long term (current) use of anticoagulants: Secondary | ICD-10-CM

## 2018-08-29 DIAGNOSIS — I480 Paroxysmal atrial fibrillation: Secondary | ICD-10-CM

## 2018-08-29 DIAGNOSIS — Z79891 Long term (current) use of opiate analgesic: Secondary | ICD-10-CM

## 2018-08-29 DIAGNOSIS — Z720 Tobacco use: Secondary | ICD-10-CM

## 2018-08-29 DIAGNOSIS — M545 Low back pain, unspecified: Secondary | ICD-10-CM

## 2018-08-29 DIAGNOSIS — J449 Chronic obstructive pulmonary disease, unspecified: Secondary | ICD-10-CM | POA: Diagnosis not present

## 2018-08-29 DIAGNOSIS — M79601 Pain in right arm: Secondary | ICD-10-CM

## 2018-08-29 DIAGNOSIS — Z79899 Other long term (current) drug therapy: Secondary | ICD-10-CM

## 2018-08-29 DIAGNOSIS — M170 Bilateral primary osteoarthritis of knee: Secondary | ICD-10-CM

## 2018-08-29 DIAGNOSIS — I10 Essential (primary) hypertension: Secondary | ICD-10-CM

## 2018-08-29 DIAGNOSIS — R200 Anesthesia of skin: Secondary | ICD-10-CM

## 2018-08-29 DIAGNOSIS — G8929 Other chronic pain: Secondary | ICD-10-CM

## 2018-08-29 DIAGNOSIS — Z7951 Long term (current) use of inhaled steroids: Secondary | ICD-10-CM

## 2018-08-29 DIAGNOSIS — G894 Chronic pain syndrome: Secondary | ICD-10-CM

## 2018-08-29 MED ORDER — FLUTICASONE-SALMETEROL 250-50 MCG/DOSE IN AEPB: 1.0000 | INHALATION_SPRAY | Freq: Two times a day (BID) | RESPIRATORY_TRACT | 6 refills | Status: DC

## 2018-08-29 MED ORDER — ALBUTEROL SULFATE HFA 108 (90 BASE) MCG/ACT IN AERS: 2.0000 | INHALATION_SPRAY | Freq: Four times a day (QID) | RESPIRATORY_TRACT | 1 refills | Status: DC | PRN

## 2018-08-29 MED ORDER — AMIODARONE HCL 200 MG PO TABS: 100.0000 mg | ORAL_TABLET | Freq: Every day | ORAL | 3 refills | Status: DC

## 2018-08-29 MED ORDER — OXYCODONE-ACETAMINOPHEN 5-325 MG PO TABS: 1.0000 | ORAL_TABLET | Freq: Every day | ORAL | 0 refills | Status: DC | PRN

## 2018-08-29 NOTE — Progress Notes (Signed)
   CC: Hypertension, Atrial Fibrillation, COPD, PVD, Chronic Pain  HPI:  [name] is a 68 y.o. F with PMHx listed below presenting for Hypertension, Atrial Fibrillation, COPD, PVD, Chronic Pain. Please see the A&P for the status of the patient's chronic medical problems.   Past Medical History:  Diagnosis Date  . A-fib ([place])   . Anxiety   . Arthritis    "qwhre; joints, back" (04/17/2017)  . Benign breast cyst in female, left 01/08/2017   Found by Screening mammogram, evaluated by U/S on 01/08/17 and determined to be a benign simple breast cyst.  . Cellulitis of left lower leg 05/30/2017  . CHF (congestive heart failure) ([place])   . Chronic lower back pain   . Chronic venous insufficiency    [name] 05/30/2017  . COPD (chronic obstructive pulmonary disease) ([place])   . Depression   . DVT (deep venous thrombosis) ([place]) 11/16/2016  . GERD (gastroesophageal reflux disease)   . Headache    "weekly for the last 3 months" (04/17/2017)  . Hyperlipidemia   . Hypertension   . Morbid obesity ([place])   . PE (pulmonary embolism)   . Pulmonary embolism ([place]) 09/21/2014   Review of Systems:  Performed and all others negative.  Physical Exam:  Vitals:   08/29/18 1500 08/29/18 1503 08/29/18 1516  BP:  (!) 170/87 (!) 158/90  Pulse:  68   Temp:  98.9 F (37.2 C)   TempSrc:  Oral   SpO2:  95%   Weight: (!) 334 lb 1.6 oz (151.5 kg)    Height: 6\' 2"  (1.88 m)     Physical Exam Constitutional:      General: She is not in acute distress.    Appearance: Normal appearance.  Cardiovascular:     Rate and Rhythm: Normal rate and regular rhythm.     Pulses: Normal pulses.     Heart sounds: Normal heart sounds.  Pulmonary:     Effort: Pulmonary effort is normal. No respiratory distress.     Breath sounds: Normal breath sounds.  Abdominal:     General: Bowel sounds are normal. There is no distension.     Palpations: Abdomen is soft.     Tenderness: There is no abdominal tenderness.   Musculoskeletal:     Right lower leg: Edema present.     Left lower leg: Edema present.     Comments: Bilateral Moderate LE edema to knees  Skin:    General: Skin is warm and dry.  Neurological:     General: No focal deficit present.     Mental Status: Mental status is at baseline.     Assessment & Plan:   See Encounters Tab for problem based charting.  Patient discussed with Dr. Dareen Piano

## 2018-08-29 NOTE — Assessment & Plan Note (Addendum)
Patient has history of COPD, followed by Pulmonology and stable on current regimen. She requests refills today. Lungs clear today. She has cut back to 2-3 cigarettes a day. - Refill Advair BID - Spiriva Daily - Refill PRN albuterol inhaler, Continue PRN albuterol nebs

## 2018-08-29 NOTE — Assessment & Plan Note (Signed)
She has Paroxysmal Atrial Fibrillation. RRR today. On Xarelto and Amiodarone. Amiodarone dose reduced in the past year to 100mg  Daily. Last TSH WNL, DLCO reduced (monitored by pulmonology). - Xarelto 20mg  Daily - Refill Amiodarone 100mg  Daily

## 2018-08-29 NOTE — Assessment & Plan Note (Signed)
Patient continues to suffer from venous insufficiency and pain in her lower extremities. She was previously treated with percocet during hospitalization for cellulitis (detailed in note on 02/05/17).  The percocet continues to be effective for her. She states at time she will take 2 in a day then not need it for two days after. She had been referred to a pain specialist for further characterization and management of her pain, but this has not been completed, possibly delayed or not effective due to ongoing COVID-19 pandemic. Will refer again. She additionally suffers from chronic pain due to osteoarthritis as well as pain in her RUE Patient wishes to go to a facility within the range of the [facility] services as she tried a pain clinic previously that was outside of their range, which made transportation too expensive. Will provide additional month of percocet while referral is pending. - oxycodone-acetaminophen 5-325 q12h PRN severe pain, #20

## 2018-08-29 NOTE — Patient Instructions (Addendum)
Thank you for allowing [place] to care for you  You have been provided refills of your Amiodarone, Advair, Albuterol, and Oxycodone  We will place a new referral to a pain specialist for you  Your blood pressure remains high today. At your next visit we will plan to start a new medication, Metoprolol, which helps with blood pressure and heart disease.  Please follow up in about 3 months

## 2018-08-29 NOTE — Assessment & Plan Note (Signed)
BP today is 170/87 on the right and 158/90 on the R. This is fairly consistent with previous of 173/99. She states she has been adherent to her medications except for metoprolol, which she was unaware she should be taking. On chart review she was correct and the metoprolol was prescribed prior to her coronary CT to help lower her heart rate. She would however benefit from improved BP control and a BB is also indicated for her heart disease. She is not comfortable starting a new medication today and I am hesitant to do anything to affect the progress she has made with her adherence to her other medications as she has been doing better and has lost 25lbs of fluid weight since her last visit. Will revisit starting metoprolol at follow up and continue her current regimen. - Consider starting low dose Metoprolol (25mg ) at follow when patient is more prepared for the addition of another medication. - Benazepril 40mg  Daily - Hydralazine 50mg  TID - Torsemide 40mg  Daily, qAM

## 2018-08-30 NOTE — Progress Notes (Signed)
Internal Medicine Clinic Attending  Case discussed with Dr. Melvin  at the time of the visit.  We reviewed the resident's history and exam and pertinent patient test results.  I agree with the assessment, diagnosis, and plan of care documented in the resident's note.  

## 2018-09-09 DIAGNOSIS — M170 Bilateral primary osteoarthritis of knee: Secondary | ICD-10-CM | POA: Diagnosis not present

## 2018-09-09 DIAGNOSIS — G4733 Obstructive sleep apnea (adult) (pediatric): Secondary | ICD-10-CM | POA: Diagnosis not present

## 2018-09-10 ENCOUNTER — Telehealth: Payer: Self-pay | Admitting: *Deleted

## 2018-09-10 NOTE — Telephone Encounter (Signed)
CALLED PATIENT LEFT VOICE MESSAGE FOR PATIENT / PAIN REFERRAL FAXED ROI TO [facility] . ON 09-02-2018

## 2018-09-25 ENCOUNTER — Other Ambulatory Visit: Payer: Self-pay | Admitting: Internal Medicine

## 2018-09-25 DIAGNOSIS — I739 Peripheral vascular disease, unspecified: Secondary | ICD-10-CM

## 2018-09-25 DIAGNOSIS — I480 Paroxysmal atrial fibrillation: Secondary | ICD-10-CM

## 2018-09-25 DIAGNOSIS — G894 Chronic pain syndrome: Secondary | ICD-10-CM

## 2018-09-25 NOTE — Telephone Encounter (Signed)
Refill Request-Pt is requesting a 90 day supply instead  of a 30 day rivaroxaban (XARELTO) 20 MG TABS tablet  [id number] - [place], [place] [address]  Pt has 2 pain pills left and is also requesting 5 pills only of her  oxyCODONE-acetaminophen (PERCOCET/ROXICET) 5-325 MG tablet until her 1st Pain Management Appt with [facility] on 10/04/2018

## 2018-09-26 ENCOUNTER — Telehealth: Payer: Self-pay | Admitting: *Deleted

## 2018-09-26 MED ORDER — RIVAROXABAN 20 MG PO TABS: 20.0000 mg | ORAL_TABLET | ORAL | 3 refills | Status: DC

## 2018-09-26 MED ORDER — OXYCODONE-ACETAMINOPHEN 5-325 MG PO TABS: 1.0000 | ORAL_TABLET | Freq: Every day | ORAL | 0 refills | Status: DC | PRN

## 2018-09-26 NOTE — Telephone Encounter (Signed)
RETURNED CALL TO PATIENT. PATIENT STATES SHE DO NOT WANT TO GO TO [place] NOR TO DR [facility]. REQUESTING CALL FROM DR [facility] CONCERNING THESE REFERRAL AND WANTING REFILL ON MEDICATION. MESSAGE TO DR MELVIN.

## 2018-09-26 NOTE — Telephone Encounter (Addendum)
Spoke with patient by phone regarding her refill requests for Xarelto and Percocet and her referral to pain management. Xarelto refilled.  She has concerns about starting pain management related to them requesting her to be off of her opioid pain medicine for 5 days prior to being seen and concerns about medications that were discussed with her by phone. She states they mentioned suboxone, which she looked up and felt as though she was being accused of being an addict and was concerned about risk of respiratory distress she read about online in the setting of her already needing CPAP.  We discussed that the pain clinic(s) were likely interested in evaluating her off her pain medicine in order to get a true assessment of her pain and that suboxone can be used as a part of pain treatment as well as addiction, though it is usually used for addiction alone. I recommended that she pick a pain specialist and go to an appointment or two before deciding if she want to stay with that pain clinic. She agree to try [facility] as she has an appointment there on July 9th. - Percocet 5mg  PRN, #5 to last until pain clinic visit

## 2018-10-03 DIAGNOSIS — M25561 Pain in right knee: Secondary | ICD-10-CM | POA: Diagnosis not present

## 2018-10-03 DIAGNOSIS — Z79899 Other long term (current) drug therapy: Secondary | ICD-10-CM | POA: Diagnosis not present

## 2018-10-03 DIAGNOSIS — G8929 Other chronic pain: Secondary | ICD-10-CM | POA: Diagnosis not present

## 2018-10-03 DIAGNOSIS — M170 Bilateral primary osteoarthritis of knee: Secondary | ICD-10-CM | POA: Diagnosis not present

## 2018-10-03 DIAGNOSIS — M25569 Pain in unspecified knee: Secondary | ICD-10-CM | POA: Diagnosis not present

## 2018-10-03 DIAGNOSIS — I10 Essential (primary) hypertension: Secondary | ICD-10-CM | POA: Diagnosis not present

## 2018-10-09 DIAGNOSIS — G4733 Obstructive sleep apnea (adult) (pediatric): Secondary | ICD-10-CM | POA: Diagnosis not present

## 2018-10-09 DIAGNOSIS — M170 Bilateral primary osteoarthritis of knee: Secondary | ICD-10-CM | POA: Diagnosis not present

## 2018-10-30 DIAGNOSIS — Z79899 Other long term (current) drug therapy: Secondary | ICD-10-CM | POA: Diagnosis not present

## 2018-11-04 DIAGNOSIS — M7989 Other specified soft tissue disorders: Secondary | ICD-10-CM | POA: Diagnosis not present

## 2018-11-04 DIAGNOSIS — M25562 Pain in left knee: Secondary | ICD-10-CM | POA: Diagnosis not present

## 2018-11-04 DIAGNOSIS — Z79899 Other long term (current) drug therapy: Secondary | ICD-10-CM | POA: Diagnosis not present

## 2018-11-04 DIAGNOSIS — M25561 Pain in right knee: Secondary | ICD-10-CM | POA: Diagnosis not present

## 2018-11-04 DIAGNOSIS — G8929 Other chronic pain: Secondary | ICD-10-CM | POA: Diagnosis not present

## 2018-11-09 DIAGNOSIS — G4733 Obstructive sleep apnea (adult) (pediatric): Secondary | ICD-10-CM | POA: Diagnosis not present

## 2018-11-09 DIAGNOSIS — M170 Bilateral primary osteoarthritis of knee: Secondary | ICD-10-CM | POA: Diagnosis not present

## 2018-11-14 ENCOUNTER — Other Ambulatory Visit: Payer: Self-pay | Admitting: *Deleted

## 2018-11-14 ENCOUNTER — Other Ambulatory Visit: Payer: Self-pay | Admitting: Internal Medicine

## 2018-11-14 DIAGNOSIS — J449 Chronic obstructive pulmonary disease, unspecified: Secondary | ICD-10-CM

## 2018-11-14 DIAGNOSIS — I739 Peripheral vascular disease, unspecified: Secondary | ICD-10-CM

## 2018-11-14 NOTE — Telephone Encounter (Signed)
Refill Approved

## 2018-11-15 MED ORDER — ATORVASTATIN CALCIUM 40 MG PO TABS: 40.0000 mg | ORAL_TABLET | Freq: Every day | ORAL | 3 refills | Status: DC

## 2018-11-15 NOTE — Telephone Encounter (Signed)
Refill approved.

## 2018-11-20 DIAGNOSIS — G4733 Obstructive sleep apnea (adult) (pediatric): Secondary | ICD-10-CM | POA: Diagnosis not present

## 2018-12-04 DIAGNOSIS — G8929 Other chronic pain: Secondary | ICD-10-CM | POA: Diagnosis not present

## 2018-12-04 DIAGNOSIS — Z79899 Other long term (current) drug therapy: Secondary | ICD-10-CM | POA: Diagnosis not present

## 2018-12-04 DIAGNOSIS — M25561 Pain in right knee: Secondary | ICD-10-CM | POA: Diagnosis not present

## 2018-12-04 DIAGNOSIS — M170 Bilateral primary osteoarthritis of knee: Secondary | ICD-10-CM | POA: Diagnosis not present

## 2018-12-04 DIAGNOSIS — M25562 Pain in left knee: Secondary | ICD-10-CM | POA: Diagnosis not present

## 2018-12-10 DIAGNOSIS — M170 Bilateral primary osteoarthritis of knee: Secondary | ICD-10-CM | POA: Diagnosis not present

## 2018-12-10 DIAGNOSIS — G4733 Obstructive sleep apnea (adult) (pediatric): Secondary | ICD-10-CM | POA: Diagnosis not present

## 2019-01-07 ENCOUNTER — Other Ambulatory Visit: Payer: Self-pay | Admitting: Primary Care

## 2019-01-07 DIAGNOSIS — J449 Chronic obstructive pulmonary disease, unspecified: Secondary | ICD-10-CM

## 2019-01-20 ENCOUNTER — Encounter: Payer: Self-pay | Admitting: Internal Medicine

## 2019-01-20 ENCOUNTER — Ambulatory Visit (INDEPENDENT_AMBULATORY_CARE_PROVIDER_SITE_OTHER): Payer: Medicare Other | Admitting: Internal Medicine

## 2019-01-20 ENCOUNTER — Other Ambulatory Visit: Payer: Self-pay

## 2019-01-20 VITALS — BP 108/57 | HR 56 | Temp 98.1°F | Wt 351.1 lb

## 2019-01-20 DIAGNOSIS — I480 Paroxysmal atrial fibrillation: Secondary | ICD-10-CM

## 2019-01-20 DIAGNOSIS — I110 Hypertensive heart disease with heart failure: Secondary | ICD-10-CM | POA: Diagnosis not present

## 2019-01-20 DIAGNOSIS — J449 Chronic obstructive pulmonary disease, unspecified: Secondary | ICD-10-CM

## 2019-01-20 DIAGNOSIS — Z79899 Other long term (current) drug therapy: Secondary | ICD-10-CM

## 2019-01-20 DIAGNOSIS — I10 Essential (primary) hypertension: Secondary | ICD-10-CM

## 2019-01-20 DIAGNOSIS — I5032 Chronic diastolic (congestive) heart failure: Secondary | ICD-10-CM

## 2019-01-20 DIAGNOSIS — I5030 Unspecified diastolic (congestive) heart failure: Secondary | ICD-10-CM

## 2019-01-20 DIAGNOSIS — Z79891 Long term (current) use of opiate analgesic: Secondary | ICD-10-CM

## 2019-01-20 DIAGNOSIS — J439 Emphysema, unspecified: Secondary | ICD-10-CM

## 2019-01-20 DIAGNOSIS — Z7901 Long term (current) use of anticoagulants: Secondary | ICD-10-CM

## 2019-01-20 DIAGNOSIS — Z7951 Long term (current) use of inhaled steroids: Secondary | ICD-10-CM

## 2019-01-20 DIAGNOSIS — M5030 Other cervical disc degeneration, unspecified cervical region: Secondary | ICD-10-CM

## 2019-01-20 DIAGNOSIS — K219 Gastro-esophageal reflux disease without esophagitis: Secondary | ICD-10-CM

## 2019-01-20 DIAGNOSIS — G894 Chronic pain syndrome: Secondary | ICD-10-CM

## 2019-01-20 MED ORDER — FLUTICASONE-SALMETEROL 250-50 MCG/DOSE IN AEPB: 1.0000 | INHALATION_SPRAY | Freq: Two times a day (BID) | RESPIRATORY_TRACT | 6 refills | Status: DC

## 2019-01-20 MED ORDER — ALBUTEROL SULFATE (2.5 MG/3ML) 0.083% IN NEBU: INHALATION_SOLUTION | RESPIRATORY_TRACT | 2 refills | Status: DC

## 2019-01-20 MED ORDER — METOPROLOL SUCCINATE ER 100 MG PO TB24: 100.0000 mg | ORAL_TABLET | Freq: Every day | ORAL | 2 refills | Status: DC

## 2019-01-20 MED ORDER — ESOMEPRAZOLE MAGNESIUM 40 MG PO CPDR: 40.0000 mg | DELAYED_RELEASE_CAPSULE | Freq: Every day | ORAL | 1 refills | Status: DC | PRN

## 2019-01-20 MED ORDER — GABAPENTIN 400 MG PO CAPS: 400.0000 mg | ORAL_CAPSULE | Freq: Two times a day (BID) | ORAL | 2 refills | Status: DC

## 2019-01-20 NOTE — Patient Instructions (Signed)
Thank you for allowing [place] to care for you  For you High Blood pressure - BP looks good today! - Continue daily Benazepril, Torsemide, and Metoprolol - Stop Hydralazine for now, can restart if BP becomes consistently elvated  For your Heart Failure - Continue Torsemide and Metoprolol - Can take 1 dose of Metolazone 30 minutes before torsemide for weight gain of 2-3lbs in 1 day or 5lbs in 1 week  Refills provided today  Follow up in about 3 months

## 2019-01-20 NOTE — Progress Notes (Signed)
   CC: Hypertension, Heart Failure, Atrial Fibrillation, Chronic Pain, COPD, GERD  HPI:  Sharon Vasquez is a 68 y.o. F with PMHx listed below presenting for Hypertension, Heart Failure, Atrial Fibrillation, Chronic Pain, COPD, GERD. Please see the A&P for the status of the patient's chronic medical problems.  Past Medical History:  Diagnosis Date  . A-fib ([place])   . Anxiety   . Arthritis    "qwhre; joints, back" (04/17/2017)  . Benign breast cyst in female, left 01/08/2017   Found by Screening mammogram, evaluated by U/S on 01/08/17 and determined to be a benign simple breast cyst.  . Cellulitis of left lower leg 05/30/2017  . CHF (congestive heart failure) ([place])   . Chronic lower back pain   . Chronic venous insufficiency    [name] 05/30/2017  . COPD (chronic obstructive pulmonary disease) ([place])   . Depression   . DVT (deep venous thrombosis) ([place]) 11/16/2016  . GERD (gastroesophageal reflux disease)   . Headache    "weekly for the last 3 months" (04/17/2017)  . Hyperlipidemia   . Hypertension   . Morbid obesity ([place])   . PE (pulmonary embolism)   . Pulmonary embolism ([place]) 09/21/2014   Review of Systems:  Performed and all others negative.  Physical Exam:  Vitals:   01/20/19 0838  BP: (!) 108/57  Pulse: (!) 56  Temp: 98.1 F (36.7 C)  TempSrc: Oral  SpO2: 96%  Weight: (!) 351 lb 1.6 oz (159.3 kg)   Physical Exam Constitutional:      General: She is not in acute distress.    Appearance: Normal appearance.  Cardiovascular:     Rate and Rhythm: Normal rate and regular rhythm.     Pulses: Normal pulses.     Heart sounds: Normal heart sounds.  Pulmonary:     Effort: Pulmonary effort is normal. No respiratory distress.     Comments: Trace LLL rales Abdominal:     General: Bowel sounds are normal. There is no distension.     Palpations: Abdomen is soft.     Tenderness: There is no abdominal tenderness.  Musculoskeletal:     Right lower leg: Edema present.     Left  lower leg: Edema present.     Comments: Bilateral Moderate LE edema to knees  Skin:    General: Skin is warm and dry.  Neurological:     General: No focal deficit present.     Mental Status: Mental status is at baseline.    Assessment & Plan:   See Encounters Tab for problem based charting.  Patient discussed with Dr. Heber Enon

## 2019-01-20 NOTE — Assessment & Plan Note (Signed)
Patient has history of COPD, followed by Pulmonology and stable on current regimen. She requests refills today. Lungs clear today. - Refill Advair BID - Spiriva Daily - RefillPRN albuterol nebs - Continue PRN albuterol inhaler

## 2019-01-20 NOTE — Assessment & Plan Note (Signed)
Patient has re-established with pain management. She remains on scheduled percocet and gabapentin. She has also been referred to orthopedics for evaluation of her knees. She requests refill of gabapentin today. - Refill Gabapentin 400mg  BID - Percocet per Pain Clinic - F/U with Pain clinic and Ortho

## 2019-01-20 NOTE — Assessment & Plan Note (Signed)
Patient has history of HFpEF and has been doing okay since she was last seen. Her dry weight is difficult to determine (previous baseline ~343, low of 334 at last visit, she states he had extra fluid on her since then a her weight got up to 378, but she is down to 351 today). She reports mild orthopnes (no worse than normal) and has trace rales on exam with oxygen saturation of 96%. We will continue current regimen and have her take Metolatone prescribed by cardiology PRN for weight gain. He Atrial fibrillation is controlled with Metoprolol and Amiodarone. - Torsemide 40mg  Daily - Metolazone 5mg  30 minutes before Torsemide, PRN for weight gain of 2-3lbs in 1 day or 5lbs in 1 week

## 2019-01-20 NOTE — Assessment & Plan Note (Signed)
>>  ASSESSMENT AND PLAN FOR DIASTOLIC HEART FAILURE (HCC) WRITTEN ON 01/20/2019  9:30 AM BY MELVIN, ALEXANDER, MD  Patient has history of HFpEF and has been doing okay since she was last seen. Her dry weight is difficult to determine (previous baseline ~343, low of 334 at last visit, she states he had extra fluid on her since then a her weight got up to 378, but she is down to 351 today). She reports mild orthopnes (no worse than normal) and has trace rales on exam with oxygen saturation of 96%. We will continue current regimen and have her take Metolatone prescribed by cardiology PRN for weight gain. He Atrial fibrillation is controlled with Metoprolol and Amiodarone. - Torsemide 40mg  Daily - Metolazone 5mg  30 minutes before Torsemide, PRN for weight gain of 2-3lbs in 1 day or 5lbs in 1 week

## 2019-01-20 NOTE — Assessment & Plan Note (Signed)
She has Paroxysmal Atrial Fibrillation. RRR today. On Xarelto and Amiodarone. Amiodarone dose reducedto 50mg  Daily due to reduced DLCO (monitored by pulmonology/cardiology). She has been started on metoprolol for dual indication of a-fib and hypertension. - Xarelto 20mg  Daily - Amiodarone 50mg  Daily - Metoprolol Succinate 100mg  Daily

## 2019-01-20 NOTE — Assessment & Plan Note (Signed)
Patient has a history of GERD, on Omeprazole. She requests a refill today, which was provided. - Omeprazole 40mg  Daily

## 2019-01-20 NOTE — Assessment & Plan Note (Signed)
BP today 108/57, down significantly from [id number] systolic at recent visits. She is feeling well. This appears to be due to improved adherence to her medications. She states she has been taking Benazepril, Torsemide, and Metoprolol everyday. Metoprolol was started since I last saw her and she has been taking 100mg  Daily instead of 50mg  BID that was prescribed, will change Rx to 24hr formulation. She has been taking her hydralazine, maybe once per day and did not take it today. Given her improvement, I have encouraged continued adherence to her regimen and trial discontinuation of hydralazine.  - Metoprolol Succinate 100mg  Daily - Benazepril 40mg  Daily - Torsemide 40mg  Daily - Stop Hydralazine50mg  TID, monitor response

## 2019-01-22 NOTE — Progress Notes (Signed)
Internal Medicine Clinic Attending  Case discussed with Dr. Melvin  at the time of the visit.  We reviewed the resident's history and exam and pertinent patient test results.  I agree with the assessment, diagnosis, and plan of care documented in the resident's note.  

## 2019-03-20 ENCOUNTER — Other Ambulatory Visit: Payer: Self-pay | Admitting: Physician Assistant

## 2019-03-21 ENCOUNTER — Other Ambulatory Visit: Payer: Self-pay | Admitting: Cardiovascular Disease

## 2019-03-21 DIAGNOSIS — G4733 Obstructive sleep apnea (adult) (pediatric): Secondary | ICD-10-CM | POA: Diagnosis not present

## 2019-03-26 ENCOUNTER — Other Ambulatory Visit: Payer: Self-pay

## 2019-03-26 NOTE — Patient Outreach (Signed)
[facility]) Care Management  03/26/2019  LIZZIEANN GIOVANNI 1950-07-07 [id number]   Medication Adherence call to Mrs. Ezequiel Kayser Hippa Identifiers Verify spoke with patient she is past due on Atorvastatin 40 mg patient explain she takes 1 tablet daily and has enough for a week and then she will order. Mrs. Imgrund is showing past due under [place].   [place] Management Direct Dial [phone]  Fax [phone] [email or web address]

## 2019-04-02 DIAGNOSIS — M25561 Pain in right knee: Secondary | ICD-10-CM | POA: Diagnosis not present

## 2019-04-02 DIAGNOSIS — M25562 Pain in left knee: Secondary | ICD-10-CM | POA: Diagnosis not present

## 2019-04-02 DIAGNOSIS — Z03818 Encounter for observation for suspected exposure to other biological agents ruled out: Secondary | ICD-10-CM | POA: Diagnosis not present

## 2019-04-02 DIAGNOSIS — Z79899 Other long term (current) drug therapy: Secondary | ICD-10-CM | POA: Diagnosis not present

## 2019-04-02 DIAGNOSIS — G8929 Other chronic pain: Secondary | ICD-10-CM | POA: Diagnosis not present

## 2019-04-02 DIAGNOSIS — M7989 Other specified soft tissue disorders: Secondary | ICD-10-CM | POA: Diagnosis not present

## 2019-04-08 ENCOUNTER — Other Ambulatory Visit: Payer: Self-pay

## 2019-04-08 NOTE — Patient Outreach (Signed)
[facility]) Care Management  04/08/2019  ENEDINA KOVASH 11-02-1950 OB:[id number]   Medication Adherence call to Mrs. [place] Identifiers Verify patient is past due on Atorvastatin 40 mg,patient explain she takes 1 tablet daily and has pick up from [facility] on 04/02/19 for a 90 days supply. Mrs. Fred is showing past due under [place].   [facility] Direct Dial [phone]  Fax [phone] [email or web address]

## 2019-04-21 ENCOUNTER — Other Ambulatory Visit: Payer: Self-pay | Admitting: Physician Assistant

## 2019-04-21 DIAGNOSIS — I5033 Acute on chronic diastolic (congestive) heart failure: Secondary | ICD-10-CM

## 2019-05-06 DIAGNOSIS — M25562 Pain in left knee: Secondary | ICD-10-CM | POA: Diagnosis not present

## 2019-05-06 DIAGNOSIS — G8929 Other chronic pain: Secondary | ICD-10-CM | POA: Diagnosis not present

## 2019-05-06 DIAGNOSIS — Z1159 Encounter for screening for other viral diseases: Secondary | ICD-10-CM | POA: Diagnosis not present

## 2019-05-06 DIAGNOSIS — F17210 Nicotine dependence, cigarettes, uncomplicated: Secondary | ICD-10-CM | POA: Diagnosis not present

## 2019-05-06 DIAGNOSIS — M25561 Pain in right knee: Secondary | ICD-10-CM | POA: Diagnosis not present

## 2019-05-06 DIAGNOSIS — Z79899 Other long term (current) drug therapy: Secondary | ICD-10-CM | POA: Diagnosis not present

## 2019-05-26 ENCOUNTER — Other Ambulatory Visit: Payer: Self-pay | Admitting: Internal Medicine

## 2019-05-26 DIAGNOSIS — Z1231 Encounter for screening mammogram for malignant neoplasm of breast: Secondary | ICD-10-CM

## 2019-06-03 DIAGNOSIS — Z79899 Other long term (current) drug therapy: Secondary | ICD-10-CM | POA: Diagnosis not present

## 2019-06-03 DIAGNOSIS — M25562 Pain in left knee: Secondary | ICD-10-CM | POA: Diagnosis not present

## 2019-06-03 DIAGNOSIS — M25561 Pain in right knee: Secondary | ICD-10-CM | POA: Diagnosis not present

## 2019-06-03 DIAGNOSIS — M7989 Other specified soft tissue disorders: Secondary | ICD-10-CM | POA: Diagnosis not present

## 2019-06-03 DIAGNOSIS — G8929 Other chronic pain: Secondary | ICD-10-CM | POA: Diagnosis not present

## 2019-06-04 DIAGNOSIS — Z0000 Encounter for general adult medical examination without abnormal findings: Secondary | ICD-10-CM | POA: Insufficient documentation

## 2019-06-05 ENCOUNTER — Other Ambulatory Visit: Payer: Self-pay

## 2019-06-05 ENCOUNTER — Encounter: Payer: Self-pay | Admitting: Internal Medicine

## 2019-06-05 ENCOUNTER — Ambulatory Visit (INDEPENDENT_AMBULATORY_CARE_PROVIDER_SITE_OTHER): Payer: Medicare Other | Admitting: Internal Medicine

## 2019-06-05 VITALS — BP 117/66 | HR 67 | Temp 98.5°F | Ht 74.0 in | Wt 362.2 lb

## 2019-06-05 DIAGNOSIS — Z0000 Encounter for general adult medical examination without abnormal findings: Secondary | ICD-10-CM

## 2019-06-05 DIAGNOSIS — I110 Hypertensive heart disease with heart failure: Secondary | ICD-10-CM

## 2019-06-05 DIAGNOSIS — Z79891 Long term (current) use of opiate analgesic: Secondary | ICD-10-CM | POA: Diagnosis not present

## 2019-06-05 DIAGNOSIS — Z23 Encounter for immunization: Secondary | ICD-10-CM

## 2019-06-05 DIAGNOSIS — Z1211 Encounter for screening for malignant neoplasm of colon: Secondary | ICD-10-CM

## 2019-06-05 DIAGNOSIS — G894 Chronic pain syndrome: Secondary | ICD-10-CM | POA: Diagnosis not present

## 2019-06-05 DIAGNOSIS — I5030 Unspecified diastolic (congestive) heart failure: Secondary | ICD-10-CM

## 2019-06-05 DIAGNOSIS — Z87891 Personal history of nicotine dependence: Secondary | ICD-10-CM

## 2019-06-05 DIAGNOSIS — Z79899 Other long term (current) drug therapy: Secondary | ICD-10-CM

## 2019-06-05 DIAGNOSIS — I5032 Chronic diastolic (congestive) heart failure: Secondary | ICD-10-CM

## 2019-06-05 DIAGNOSIS — I10 Essential (primary) hypertension: Secondary | ICD-10-CM

## 2019-06-05 MED ORDER — HYDRALAZINE HCL 50 MG PO TABS: 25.0000 mg | ORAL_TABLET | Freq: Three times a day (TID) | ORAL | 3 refills | Status: DC

## 2019-06-05 MED ORDER — BENAZEPRIL HCL 40 MG PO TABS: 40.0000 mg | ORAL_TABLET | Freq: Every day | ORAL | 3 refills | Status: DC

## 2019-06-05 NOTE — Assessment & Plan Note (Signed)
Mrs Lohmeier' heart failure appears to be adequately controlled at this time, though she is not entirely adherent to her medication regimen. Her weight is up from her last visit in October (362 from 351), it is hard to tell if this is due to volume overload or increased dry weight. She is not very mobile and now get around on a motorized scooter. She is not having increased shortness of breath (though she is not exerting herself much) and her lungs are clear on exam.  She has not taken her diuretic everyday due to ongoing issues with what is described as possible mixed stress and urge incontinence (unfortunately due to time constraints were unable to delve deeper into this issue today, but she does have incontinence supplies). She was encouraged to take her fluid pill daily (along with other medications). She is also due for follow up with cardiology, who she stated she planned to contact. She will continue her current regimen. - Metoprolol 100mg  Daily - Torsemide 40mg  Daily - Metolazone 5mg  30 minutes before Torsemide, PRN for weight gain of 2-3lbs in 1 day or 5lbs in 1 week

## 2019-06-05 NOTE — Assessment & Plan Note (Signed)
Patient continues to follow with pain management for pain control. She is also following with orthopedics for knee injections. We provide Gabapentin for her. We will continue current regimen for now. - Gabapentin 400mg  BID - Percocet per Pain Clinic - F/U with Pain clinic and Ortho

## 2019-06-05 NOTE — Patient Instructions (Signed)
Thank you for allowing [place] to care for you  Continue Heart Failure and Blood pressure medications - Try to take these every day - Follow up with cardiology, remember to mention arm pain with elevated BP  Follow up with Pain specialist and orthopedics  TDAP  (Tetanus vaccine) today  Colon screening card ordered  Follow up in about 3 months

## 2019-06-05 NOTE — Assessment & Plan Note (Signed)
BP remains at goal today at 117/66. She states she did not stop taking hydralazine as discussed at our last visit, but she does not always take all three doses. She was instructed to take all of her medications everyday, so that we can adjust them appropriately.  She states she has had several episodes of elevated blood pressures (close to or in the [id number] mmHg systolic range) after forgetting her medications; and during the episodes has noted some left arm pain. This is concerning for possible anginal pain given atypical pain presentations in females and her history of coronary calcifications noted on prior CT. She was encourage take her medications delay to prevent these episodes (like due to demand in the setting of severely elevated BP) and to follow up with cardiology as soon as she can get scheduled. She states she will contact them. - Metoprolol Succinate 100mg  Daily - Benazepril 40mg  Daily - Torsemide 40mg  Daily - Hydralazine50mg  TID

## 2019-06-05 NOTE — Progress Notes (Signed)
   CC: Heart Failure, Hypertension, Chronic Pain, Preventative Health Care  HPI:  [name] is a 69 y.o. F with PMHx listed below presenting for Heart Failure, Hypertension, Chronic Pain, Preventative Health Care. Please see the A&P for the status of the patient's chronic medical problems.   Past Medical History:  Diagnosis Date  . A-fib ([place])   . Anxiety   . Arthritis    "qwhre; joints, back" (04/17/2017)  . Benign breast cyst in female, left 01/08/2017   Found by Screening mammogram, evaluated by [facility]/S on 01/08/17 and determined to be a benign simple breast cyst.  . Cellulitis of left lower leg 05/30/2017  . CHF (congestive heart failure) ([place])   . Chronic lower back pain   . Chronic venous insufficiency    [name] 05/30/2017  . COPD (chronic obstructive pulmonary disease) ([place])   . Depression   . DVT (deep venous thrombosis) ([place]) 11/16/2016  . GERD (gastroesophageal reflux disease)   . Headache    "weekly for the last 3 months" (04/17/2017)  . Hyperlipidemia   . Hypertension   . Morbid obesity ([place])   . PE (pulmonary embolism)   . Pulmonary embolism ([place]) 09/21/2014   Review of Systems:  Performed and all others negative.  Physical Exam:  Vitals:   06/05/19 1341  BP: 117/66  Pulse: 67  Temp: 98.5 F (36.9 C)  TempSrc: Oral  SpO2: 94%  Weight: (!) 362 lb 3.2 oz (164.3 kg)  Height: 6\' 2"  (1.88 m)    Physical Exam Constitutional:      General: She is not in acute distress.    Appearance: Normal appearance. She is obese.     Comments: In powered wheelchair  Cardiovascular:     Rate and Rhythm: Normal rate and regular rhythm.     Pulses: Normal pulses.     Heart sounds: Normal heart sounds.  Pulmonary:     Effort: Pulmonary effort is normal. No respiratory distress.  Abdominal:     General: Bowel sounds are normal. There is no distension.     Palpations: Abdomen is soft.     Tenderness: There is no abdominal tenderness.  Musculoskeletal:     Right lower  leg: Edema present.     Left lower leg: Edema present.     Comments: Chronic mild LE edema with stasis changes  Skin:    General: Skin is warm and dry.  Neurological:     General: No focal deficit present.     Mental Status: Mental status is at baseline.    Assessment & Plan:   See Encounters Tab for problem based charting.  Patient discussed with Dr. Daryll Drown

## 2019-06-05 NOTE — Assessment & Plan Note (Signed)
-   TDAP given  - She report her pain doctor said she needs to be checked for throat cancer due to her smoking history, but states she's not sure why this was recommended. I have asked for these notes to be sent to [place]. It is possible this may be reference to her history of a vocal cord polylp.  - Fecal occult card  Provided  - PNA vaccine deferred/declined  - Flu vaccine declined

## 2019-06-05 NOTE — Assessment & Plan Note (Signed)
>>  ASSESSMENT AND PLAN FOR DIASTOLIC HEART FAILURE (HCC) WRITTEN ON 06/05/2019  9:15 PM BY Beola Cord, MD   Sharon Vasquez' heart failure appears to be adequately controlled at this time, though she is not entirely adherent to her medication regimen. Her weight is up from her last visit in October (362 from 351), it is hard to tell if this is due to volume overload or increased dry weight. She is not very mobile and now get around on a motorized scooter. She is not having increased shortness of breath (though she is not exerting herself much) and her lungs are clear on exam.  She has not taken her diuretic everyday due to ongoing issues with what is described as possible mixed stress and urge incontinence (unfortunately due to time constraints were unable to delve deeper into this issue today, but she does have incontinence supplies). She was encouraged to take her fluid pill daily (along with other medications). She is also due for follow up with cardiology, who she stated she planned to contact. She will continue her current regimen. - Metoprolol 100mg  Daily - Torsemide 40mg  Daily - Metolazone 5mg  30 minutes before Torsemide, PRN for weight gain of 2-3lbs in 1 day or 5lbs in 1 week

## 2019-06-09 ENCOUNTER — Telehealth: Payer: Self-pay

## 2019-06-09 DIAGNOSIS — G894 Chronic pain syndrome: Secondary | ICD-10-CM

## 2019-06-09 DIAGNOSIS — H2513 Age-related nuclear cataract, bilateral: Secondary | ICD-10-CM | POA: Diagnosis not present

## 2019-06-09 DIAGNOSIS — I5032 Chronic diastolic (congestive) heart failure: Secondary | ICD-10-CM

## 2019-06-09 NOTE — Telephone Encounter (Signed)
I agree, Thank you!

## 2019-06-09 NOTE — Telephone Encounter (Signed)
The patient called to inquire whether she should receive a Covid vaccination.  The patient screened negative for a history of severe allergic reaction to any other vaccine or injectable therapy.  The patient was informed that she is eligible for the vaccine based on published contraindications and precautions.  The patient was encouraged to sign up for a vaccination appointment when she becomes eligible. SChaplin, RN,BSN

## 2019-06-09 NOTE — Telephone Encounter (Signed)
Pt would like to know if it's okay to get the COVID vaccine. Please call pt back.

## 2019-06-10 DIAGNOSIS — M25561 Pain in right knee: Secondary | ICD-10-CM | POA: Diagnosis not present

## 2019-06-10 DIAGNOSIS — M25562 Pain in left knee: Secondary | ICD-10-CM | POA: Diagnosis not present

## 2019-06-10 NOTE — Progress Notes (Signed)
Internal Medicine Clinic Attending  Case discussed with Dr. Melvin  at the time of the visit.  We reviewed the resident's history and exam and pertinent patient test results.  I agree with the assessment, diagnosis, and plan of care documented in the resident's note.  

## 2019-06-10 NOTE — Telephone Encounter (Signed)
Pls contact pt regarding a test; pt [phone]

## 2019-06-10 NOTE — Telephone Encounter (Signed)
Return call to pt - stated she went to [facility] who told her she has cataracts and needs laser surgery. Pt wants to know if it's ok for her to have the surgery?

## 2019-06-11 NOTE — Telephone Encounter (Signed)
Spoke with patient by phone and confirmed that she should see the ophthalmologist as this is a low-risk procedure.  I confirmed that she should proceed with getting her vaccine.  She also requests some form of home health or Aid to help at home, I told her I would look into this for her. She will likely benefit from chronic care management referral.

## 2019-06-16 DIAGNOSIS — H2512 Age-related nuclear cataract, left eye: Secondary | ICD-10-CM | POA: Diagnosis not present

## 2019-06-16 DIAGNOSIS — H2513 Age-related nuclear cataract, bilateral: Secondary | ICD-10-CM | POA: Diagnosis not present

## 2019-06-18 ENCOUNTER — Ambulatory Visit: Payer: Medicare Other | Admitting: *Deleted

## 2019-06-18 ENCOUNTER — Ambulatory Visit: Payer: Medicare Other

## 2019-06-18 DIAGNOSIS — I10 Essential (primary) hypertension: Secondary | ICD-10-CM

## 2019-06-18 DIAGNOSIS — J439 Emphysema, unspecified: Secondary | ICD-10-CM

## 2019-06-18 DIAGNOSIS — I5032 Chronic diastolic (congestive) heart failure: Secondary | ICD-10-CM

## 2019-06-18 DIAGNOSIS — I480 Paroxysmal atrial fibrillation: Secondary | ICD-10-CM

## 2019-06-18 NOTE — Patient Instructions (Signed)
Visit Information  Goals Addressed            This Visit's Progress   . "I need a scooter to help me get around"       Current Barriers:  [name] Knowledge Barriers related to resources and support available to address needs related to Inability to perform ADL's independently; need for DME.  Case Manager Clinical Goal(s):  [name] Over the next 30 days, patient will work with BSW to address needs related to Inability to perform ADL's independently, need for DME.   . Over the next 30 days, BSW will collaborate with RN Care Manager to address care management and care coordination needs  Interventions:  . Patient interviewed and appropriate assessments performed . Collaborated with RN Care Manager and patient to establish an individualized plan of care  . Advised patient to contact insurance provider regarding coverage for DME.   . In basket message sent to provider regarding patient's request for DME.   [name] Collaboration with RN Case Manager   Patient Self Care Activities:  . Attends all scheduled provider appointments . Calls provider office for new concerns or questions  Initial goal documentation     . "I need help applying for Medicaid" (pt-stated)       Current Barriers:  [name] Knowledge Barriers of resources and support to address financial constraints related to cost of personal care services, ADL IADL limitations, and limited access to caregiver  Case Manager Clinical Goal(s):  [name] Over the next 90 days, patient will work with BSW to address financial constraints related to cost of personal care services not covered by current insurance, applying for Medicaid  . Over the next 90 days, BSW will collaborate with RN Care Manager to address care management and care coordination needs  Interventions:  . Patient interviewed and appropriate assessments performed . Collaborated with RN Care Manager and patient to establish an individualized plan of care  . Informed patient that [place] are not covered by Medicare.  At this time, patient would be required to pay out-of-pocket for aide services but is unable to afford cost.  . Collaborated with care guide team regarding assistance with Medicaid application.  . Informed patient that she can be assessed for [place] when/if she is approved for Mediciad . Collaboration with RN Case Manager   Patient Self Care Activities:  . Patient verbalizes understanding of plan to be contacted by Care Guide for assistance with Medicaid application . Attends all scheduled provider appointments . Calls provider office for new concerns or questions  Initial goal documentation        Sharon Vasquez was given information about Care Management services today including:  1. Care Management services include personalized support from designated clinical staff supervised by her physician, including individualized plan of care and coordination with other care providers 2. 24/7 contact phone numbers for assistance for urgent and routine care needs. 3. The patient may stop CCM services at any time (effective at the end of the month) by phone call to the office staff.  Patient agreed to services and verbal consent obtained.   Patient verbalizes understanding of instructions provided today.   The care management team will reach out to the patient again over the next three weeks days.   Ronn Melena, [place] Coordination Social Worker [place] [phone]

## 2019-06-18 NOTE — Progress Notes (Signed)
Internal Medicine Clinic Attending  CCM services provided by the care management provider and their documentation were discussed with Dr. Benjamine Mola.  We reviewed the pertinent findings, urgent action items addressed by the resident and non-urgent items to be addressed by the PCP.  I agree with the assessment, diagnosis, and plan of care documented in the CCM and resident's note.  Larey Dresser, MD 06/18/2019

## 2019-06-18 NOTE — Chronic Care Management (AMB) (Signed)
Chronic Care Management   Initial Visit Note  06/18/2019 Name: Sharon Vasquez [name]: [id number] DOB: 1950/08/03  Referred by: Neva Seat, MD Reason for referral : Chronic Care Management (HF, COPD, HTN, PAF)   Sharon Vasquez is a 69 y.o. year old female who is a primary care patient of Neva Seat, MD. The CCM team was consulted for assistance with chronic disease management and care coordination needs related to Atrial Fibrillation, CHF, HTN and COPD  Review of patient status, including review of consultants reports, relevant laboratory and other test results, and collaboration with appropriate care team members and the patient's provider was performed as part of comprehensive patient evaluation and provision of chronic care management services.    SDOH (Social Determinants of Health) assessments performed: No See Care Plan activities for detailed interventions related to SDOH     Medications: Outpatient Encounter Medications as of 06/18/2019  Medication Sig  . albuterol (PROAIR HFA) 108 (90 Base) MCG/ACT inhaler Inhale 2 puffs into the lungs every 6 (six) hours as needed for wheezing or shortness of breath.  [name] albuterol (PROVENTIL) (2.5 MG/3ML) 0.083% nebulizer solution TAKE 3 MLS(1 AMPULE) BY NEBULIZATION EVERY 4 HOURS AS NEEDED FOR WHHEZING OR SHORTNESS OF BREATH  . amiodarone (PACERONE) 200 MG tablet Take 0.5 tablets (100 mg total) by mouth daily.  [name] aspirin EC 81 MG tablet Take 81 mg by mouth daily.  [name] atorvastatin (LIPITOR) 40 MG tablet Take 1 tablet (40 mg total) by mouth daily.  . benazepril (LOTENSIN) 40 MG tablet Take 1 tablet (40 mg total) by mouth daily.  [name] esomeprazole (NEXIUM) 40 MG capsule Take 1 capsule (40 mg total) by mouth daily as needed (for heartburn or indigestion).  . Fluticasone-Salmeterol (ADVAIR DISKUS) 250-50 MCG/DOSE AEPB Inhale 1 puff into the lungs 2 (two) times daily. Rinse mouth after each use  . gabapentin (NEURONTIN) 400 MG capsule Take 1  capsule (400 mg total) by mouth 2 (two) times daily.  . hydrALAZINE (APRESOLINE) 50 MG tablet Take 0.5 tablets (25 mg total) by mouth 3 (three) times daily.  . metoprolol succinate (TOPROL XL) 100 MG 24 hr tablet Take 1 tablet (100 mg total) by mouth daily. Take with or immediately following a meal.  . oxyCODONE-acetaminophen (PERCOCET/ROXICET) 5-325 MG tablet Take 1 tablet by mouth daily as needed for severe pain.  . potassium chloride (KLOR-CON) 10 MEQ tablet TAKE 2 TABLETS(20 MEQ) BY MOUTH TWICE DAILY  . rivaroxaban (XARELTO) 20 MG TABS tablet Take 1 tablet (20 mg total) by mouth every morning.  [name] SPIRIVA HANDIHALER 18 MCG inhalation capsule PLACE 1 CAPSULE INTO INHALER AND INHALE DAILY  . torsemide (DEMADEX) 20 MG tablet TAKE 2 TABLETS BY MOUTH DAILY   No facility-administered encounter medications on file as of 06/18/2019.     Objective:  BP Readings from Last 3 Encounters:  06/05/19 117/66  01/20/19 (!) 108/57  08/29/18 (!) 158/90   Wt Readings from Last 3 Encounters:  06/05/19 (!) 362 lb 3.2 oz (164.3 kg)  01/20/19 (!) 351 lb 1.6 oz (159.3 kg)  08/29/18 (!) 334 lb 1.6 oz (151.5 kg)   Echocardiogram report of 06/03/18- The left ventricle has normal systolic function with an ejection  fraction of 60-65%. The cavity size was normal. There is mild concentric  left ventricular hypertrophy. No evidence of left ventricular regional  wall motion abnormalities.   Goals Addressed            This Visit's Progress     Patient Stated   . "  I told my doctor I need help in my home" (pt-stated)       [place] (see longitudinal plan of care for additional care plan information)   Current Barriers:  . Chronic Disease Management support, education, and care coordination needs related to Atrial Fibrillation, CHF, HTN, and COPD  Case Manager Clinical Goal(s):  [name] Over the next 30 days, patient will work with BSW to address needs related to Inability to perform ADL's independently  and Inability to perform IADL's independently in patient with Atrial Fibrillation, CHF, HTN, and COPD  Interventions:  . Collaborated with BSW to initiate plan of care to address needs related to Inability to perform ADL's independently, Inability to perform IADL's independently in patient with Atrial Fibrillation, CHF, HTN, and COPD  Patient Self Care Activities:  . Self administers medications as prescribed . Attends all scheduled provider appointments . Calls pharmacy for medication refills . Performs ADL's independently . Performs IADL's independently . Calls provider office for new concerns or questions  Initial goal documentation         Sharon Vasquez was given information about Chronic Care Management services today including:  1. CCM service includes personalized support from designated clinical staff supervised by her physician, including individualized plan of care and coordination with other care providers 2. 24/7 contact phone numbers for assistance for urgent and routine care needs. 3. Service will only be billed when office clinical staff spend 20 minutes or more in a month to coordinate care. 4. Only one practitioner may furnish and bill the service in a calendar month. 5. The patient may stop CCM services at any time (effective at the end of the month) by phone call to the office staff. 6. The patient will be responsible for cost sharing (co-pay) of up to 20% of the service fee (after annual deductible is met).  Patient agreed to services and verbal consent obtained.   Plan:   The care management team will reach out to the patient again over the next 7 days.   Kelli Churn RN, CCM, [facility] RN Care Manager [phone]

## 2019-06-18 NOTE — Chronic Care Management (AMB) (Signed)
Chronic Care Management    Clinical Social Work General Note  06/18/2019 Name: Sharon Vasquez [name]: [id number] DOB: 12-Sep-1950  Sharon Vasquez is a 69 y.o. year old female who is a primary care patient of Sharon Seat, MD. The CCM was consulted to assist the patient with financial difficulties related to obtaining [place].  . Patient also requesting assistance with obtaining DME/Scooter.    Sharon Vasquez was given information about Chronic Care Management services today including:  1. CCM service includes personalized support from designated clinical staff supervised by her physician, including individualized plan of care and coordination with other care providers 2. 24/7 contact phone numbers for assistance for urgent and routine care needs. 3. Service will only be billed when office clinical staff spend 20 minutes or more in a month to coordinate care. 4. Only one practitioner may furnish and bill the service in a calendar month. 5. The patient may stop CCM services at any time (effective at the end of the month) by phone call to the office staff. 6. The patient will be responsible for cost sharing (co-pay) of up to 20% of the service fee (after annual deductible is met).  Patient agreed to services and verbal consent obtained.   Review of patient status, including review of consultants reports, relevant laboratory and other test results, and collaboration with appropriate care team members and the patient's provider was performed as part of comprehensive patient evaluation and provision of chronic care management services.    SDOH (Social Determinants of Health) assessments and interventions performed:  Yes SDOH Interventions     Most Recent Value  SDOH Interventions  SDOH Interventions for the Following Domains  Financial Strain, Food Insecurity, Housing, Transportation  Food Insecurity Interventions  Other (Comment) [No intervention needed-patient recently approved for  food stamps]  Financial Strain Interventions  Other (Comment) [referral to Care Guide for assistance with Medicaid application]  Housing Interventions  -- [no intervention needed]  Transportation Interventions  Other (Comment) [no intervention needed-patient has transportation benefit with [employer]]       Outpatient Encounter Medications as of 06/18/2019  Medication Sig  . albuterol (PROAIR HFA) 108 (90 Base) MCG/ACT inhaler Inhale 2 puffs into the lungs every 6 (six) hours as needed for wheezing or shortness of breath.  [name] albuterol (PROVENTIL) (2.5 MG/3ML) 0.083% nebulizer solution TAKE 3 MLS(1 AMPULE) BY NEBULIZATION EVERY 4 HOURS AS NEEDED FOR WHHEZING OR SHORTNESS OF BREATH  . amiodarone (PACERONE) 200 MG tablet Take 0.5 tablets (100 mg total) by mouth daily.  [name] aspirin EC 81 MG tablet Take 81 mg by mouth daily.  [name] atorvastatin (LIPITOR) 40 MG tablet Take 1 tablet (40 mg total) by mouth daily.  . benazepril (LOTENSIN) 40 MG tablet Take 1 tablet (40 mg total) by mouth daily.  [name] esomeprazole (NEXIUM) 40 MG capsule Take 1 capsule (40 mg total) by mouth daily as needed (for heartburn or indigestion).  . Fluticasone-Salmeterol (ADVAIR DISKUS) 250-50 MCG/DOSE AEPB Inhale 1 puff into the lungs 2 (two) times daily. Rinse mouth after each use  . gabapentin (NEURONTIN) 400 MG capsule Take 1 capsule (400 mg total) by mouth 2 (two) times daily.  . hydrALAZINE (APRESOLINE) 50 MG tablet Take 0.5 tablets (25 mg total) by mouth 3 (three) times daily.  . metoprolol succinate (TOPROL XL) 100 MG 24 hr tablet Take 1 tablet (100 mg total) by mouth daily. Take with or immediately following a meal.  . oxyCODONE-acetaminophen (PERCOCET/ROXICET) 5-325 MG tablet Take 1 tablet  by mouth daily as needed for severe pain.  . potassium chloride (KLOR-CON) 10 MEQ tablet TAKE 2 TABLETS(20 MEQ) BY MOUTH TWICE DAILY  . rivaroxaban (XARELTO) 20 MG TABS tablet Take 1 tablet (20 mg total) by mouth every morning.  [name] SPIRIVA HANDIHALER  18 MCG inhalation capsule PLACE 1 CAPSULE INTO INHALER AND INHALE DAILY  . torsemide (DEMADEX) 20 MG tablet TAKE 2 TABLETS BY MOUTH DAILY   No facility-administered encounter medications on file as of 06/18/2019.    Goals Addressed            This Visit's Progress   . "I need a scooter to help me get around"       Current Barriers:  [name] Knowledge Barriers related to resources and support available to address needs related to Inability to perform ADL's independently; need for DME.  Case Manager Clinical Goal(s):  [name] Over the next 30 days, patient will work with BSW to address needs related to Inability to perform ADL's independently, need for DME.   . Over the next 30 days, BSW will collaborate with RN Care Manager to address care management and care coordination needs  Interventions:  . Patient interviewed and appropriate assessments performed . Collaborated with RN Care Manager and patient to establish an individualized plan of care  . Advised patient to contact insurance provider regarding coverage for DME.   . In basket message sent to provider regarding patient's request for DME.   [name] Collaboration with RN Case Manager   Patient Self Care Activities:  . Attends all scheduled provider appointments . Calls provider office for new concerns or questions  Initial goal documentation     . "I need help applying for Medicaid" (pt-stated)       Current Barriers:  [name] Knowledge Barriers of resources and support to address financial constraints related to cost of personal care services, ADL IADL limitations, and limited access to caregiver  Case Manager Clinical Goal(s):  [name] Over the next 90 days, patient will work with BSW to address financial constraints related to cost of personal care services not covered by current insurance, applying for Medicaid  . Over the next 90 days, BSW will collaborate with RN Care Manager to address care management and care coordination needs  Interventions:    . Patient interviewed and appropriate assessments performed . Collaborated with RN Care Manager and patient to establish an individualized plan of care  . Informed patient that [place] are not covered by Medicare.  At this time, patient would be required to pay out-of-pocket for aide services but is unable to afford cost.  . Collaborated with care guide team regarding assistance with Medicaid application.  . Informed patient that she can be assessed for [place] when/if she is approved for Mediciad . Collaboration with RN Case Manager   Patient Self Care Activities:  . Patient verbalizes understanding of plan to be contacted by Care Guide for assistance with Medicaid application . Attends all scheduled provider appointments . Calls provider office for new concerns or questions  Initial goal documentation         Follow Up Plan: SW will follow up with patient by phone over the next three weeks.           Ronn Melena, [place] Coordination Social Worker [place] [phone]

## 2019-06-18 NOTE — Patient Instructions (Signed)
Visit Information  Goals Addressed            This Visit's Progress     Patient Stated   . " I told my doctor I need help in my home" (pt-stated)       [place] (see longitudinal plan of care for additional care plan information)   Current Barriers:  . Chronic Disease Management support, education, and care coordination needs related to Atrial Fibrillation, CHF, HTN, and COPD  Case Manager Clinical Goal(s):  [name] Over the next 30 days, patient will work with BSW to address needs related to Inability to perform ADL's independently and Inability to perform IADL's independently in patient with Atrial Fibrillation, CHF, HTN, and COPD  Interventions:  . Collaborated with BSW to initiate plan of care to address needs related to Inability to perform ADL's independently, Inability to perform IADL's independently in patient with Atrial Fibrillation, CHF, HTN, and COPD  Patient Self Care Activities:  . Self administers medications as prescribed . Attends all scheduled provider appointments . Calls pharmacy for medication refills . Performs ADL's independently . Performs IADL's independently . Calls provider office for new concerns or questions  Initial goal documentation        Ms. Zervas was given information about Chronic Care Management services today including:  1. CCM service includes personalized support from designated clinical staff supervised by her physician, including individualized plan of care and coordination with other care providers 2. 24/7 contact phone numbers for assistance for urgent and routine care needs. 3. Service will only be billed when office clinical staff spend 20 minutes or more in a month to coordinate care. 4. Only one practitioner may furnish and bill the service in a calendar month. 5. The patient may stop CCM services at any time (effective at the end of the month) by phone call to the office staff. 6. The patient will be responsible for cost  sharing (co-pay) of up to 20% of the service fee (after annual deductible is met).  Patient agreed to services and verbal consent obtained.   The patient verbalized understanding of instructions provided today and declined a print copy of patient instruction materials.   The care management team will reach out to the patient again over the next 7 days.   Kelli Churn RN, CCM, [facility] RN Care Manager [phone]

## 2019-06-18 NOTE — Progress Notes (Signed)
Internal Medicine Clinic Resident   I have personally reviewed this encounter including the documentation in this note and/or discussed this patient with the care management provider. I will address any urgent items identified by the care management provider and will communicate my actions to the patient's PCP. I have reviewed the patient's CCM visit with my supervising attending.  Jeanmarie Hubert, MD 06/18/2019

## 2019-06-19 ENCOUNTER — Telehealth: Payer: Self-pay

## 2019-06-19 ENCOUNTER — Ambulatory Visit: Payer: Self-pay

## 2019-06-19 DIAGNOSIS — G4733 Obstructive sleep apnea (adult) (pediatric): Secondary | ICD-10-CM | POA: Diagnosis not present

## 2019-06-19 DIAGNOSIS — J439 Emphysema, unspecified: Secondary | ICD-10-CM

## 2019-06-19 DIAGNOSIS — I5032 Chronic diastolic (congestive) heart failure: Secondary | ICD-10-CM

## 2019-06-19 NOTE — Patient Instructions (Signed)
Visit Information  Goals Addressed            This Visit's Progress   . "I need a scooter to help me get around"       Current Barriers:  [name] Knowledge Barriers related to resources and support available to address needs related to Inability to perform ADL's independently; need for DME.  Case Manager Clinical Goal(s):  [name] Over the next 30 days, patient will work with BSW to address needs related to Inability to perform ADL's independently, need for DME.   . Over the next 30 days, BSW will collaborate with RN Care Manager to address care management and care coordination needs  Interventions:  . Collaborated with Dr. Trilby Drummer about patient's request for scooter.  He is happy to assist with order for DME, however, it may not be covered by insurance provider due to patient having power wheelchair.   . Contacted patient to discuss this.  Patient states that her power wheelchair was not obtained through insurance.   . In basket message sent to Dr. Trilby Drummer to provide this update.    Patient Self Care Activities:  . Attends all scheduled provider appointments . Calls provider office for new concerns or questions  Please see past updates related to this goal by clicking on the "Past Updates" button in the selected goal      . "I need help applying for Medicaid" (pt-stated)       Current Barriers:  [name] Knowledge Barriers of resources and support to address financial constraints related to cost of personal care services, ADL IADL limitations, and limited access to caregiver  Case Manager Clinical Goal(s):  [name] Over the next 90 days, patient will work with BSW to address financial constraints related to cost of personal care services not covered by current insurance, applying for Medicaid  . Over the next 90 days, BSW will collaborate with RN Care Manager to address care management and care coordination needs  Interventions:  . Contacted by Care Guide; Medicaid application was completed via  phone. . Received completed application from [place] and mailed to patient for signature.  Per Care Guide, patient is aware that application needs to be submitted to [place].   Patient Self Care Activities:  . Attends all scheduled provider appointments . Calls provider office for new concerns or questions  Please see past updates related to this goal by clicking on the "Past Updates" button in the selected goal         Patient verbalizes understanding of instructions provided today.   Telephone follow up appointment with Care Management Social Worker has been scheduled for 07/07/19.     Ronn Melena, [place] Coordination Social Worker [place] [phone]

## 2019-06-19 NOTE — Progress Notes (Signed)
Internal Medicine Clinic Resident   I have personally reviewed this encounter including the documentation in this note and/or discussed this patient with the care management provider. I will address any urgent items identified by the care management provider and will communicate my actions to the patient's PCP. I have reviewed the patient's CCM visit with my supervising attending.  Abigal Choung, MD 06/19/2019    

## 2019-06-19 NOTE — Telephone Encounter (Signed)
Spoke with patient to complete Medicaid application and gave patient address where to mail and to sign before mailing.  Emailed completed form to [employer], she will mail to patient.  Ambrose Mantle [phone]

## 2019-06-19 NOTE — Progress Notes (Signed)
Internal Medicine Clinic Attending  CCM services provided by the care management provider and their documentation were discussed with Dr. Benjamine Mola at the time of the visit.  We reviewed the pertinent findings, urgent action items addressed by the resident and non-urgent items to be addressed by the PCP.  I agree with the assessment, diagnosis, and plan of care documented in the CCM and resident's note.  Larey Dresser, MD 06/19/2019

## 2019-06-19 NOTE — Chronic Care Management (AMB) (Signed)
Care Management   Follow Up Note   06/19/2019 Name: Sharon Vasquez [name]: [id number] DOB: 1950/12/24  Referred by: Sharon Seat, MD Reason for referral : Care Coordination (PCS, Medicaid application, DME)   Sharon Vasquez is a 69 y.o. year old female who is a primary care patient of Sharon Seat, MD. The care management team was consulted for assistance with care management and care coordination needs.    Review of patient status, including review of consultants reports, relevant laboratory and other test results, and collaboration with appropriate care team members and the patient's provider was performed as part of comprehensive patient evaluation and provision of chronic care management services.    SDOH (Social Determinants of Health) assessments performed: No See Care Plan activities for detailed interventions related to [facility])     Advanced Directives: See Care Plan and [employer] application for related entries.   Goals Addressed            This Visit's Progress   . "I need a scooter to help me get around"       Current Barriers:  [name] Knowledge Barriers related to resources and support available to address needs related to Inability to perform ADL's independently; need for DME.  Case Manager Clinical Goal(s):  [name] Over the next 30 days, patient will work with BSW to address needs related to Inability to perform ADL's independently, need for DME.   . Over the next 30 days, BSW will collaborate with RN Care Manager to address care management and care coordination needs  Interventions:  . Collaborated with Sharon Vasquez about patient's request for scooter.  He is happy to assist with order for DME, however, it may not be covered by insurance provider due to patient having power wheelchair.   . Contacted patient to discuss this.  Patient states that her power wheelchair was not obtained through insurance.   . In basket message sent to Sharon Vasquez to provide this update.    Patient  Self Care Activities:  . Attends all scheduled provider appointments . Calls provider office for new concerns or questions  Please see past updates related to this goal by clicking on the "Past Updates" button in the selected goal      . "I need help applying for Medicaid" (pt-stated)       Current Barriers:  [name] Knowledge Barriers of resources and support to address financial constraints related to cost of personal care services, ADL IADL limitations, and limited access to caregiver  Case Manager Clinical Goal(s):  [name] Over the next 90 days, patient will work with BSW to address financial constraints related to cost of personal care services not covered by current insurance, applying for Medicaid  . Over the next 90 days, BSW will collaborate with RN Care Manager to address care management and care coordination needs  Interventions:  . Contacted by Care Guide; Medicaid application was completed via phone. . Received completed application from Sharon Vasquez and mailed to patient for signature.  Per Care Guide, patient is aware that application needs to be submitted to Sharon Vasquez.   Patient Self Care Activities:  . Attends all scheduled provider appointments . Calls provider office for new concerns or questions  Please see past updates related to this goal by clicking on the "Past Updates" button in the selected goal          Telephone follow up appointment with Care Management Social Worker scheduled for 07/07/19  Sharon Vasquez, [place] Coordination Social Worker [place] [phone]

## 2019-06-20 NOTE — Progress Notes (Signed)
Internal Medicine Clinic Resident   I have personally reviewed this encounter including the documentation in this note and/or discussed this patient with the care management provider. I will address any urgent items identified by the care management provider and will communicate my actions to the patient's PCP. I have reviewed the patient's CCM visit with my supervising attending.  Amar Sippel, MD 06/20/2019    

## 2019-06-24 ENCOUNTER — Encounter: Payer: Self-pay | Admitting: *Deleted

## 2019-06-24 ENCOUNTER — Ambulatory Visit: Payer: Medicare Other | Admitting: *Deleted

## 2019-06-24 DIAGNOSIS — I5032 Chronic diastolic (congestive) heart failure: Secondary | ICD-10-CM

## 2019-06-24 DIAGNOSIS — J439 Emphysema, unspecified: Secondary | ICD-10-CM

## 2019-06-24 NOTE — Progress Notes (Signed)
Internal Medicine Clinic Attending  CCM services provided by the care management provider and their documentation were discussed with Dr. MacLean at the time of the visit.  We reviewed the pertinent findings, urgent action items addressed by the resident and non-urgent items to be addressed by the PCP.  I agree with the assessment, diagnosis, and plan of care documented in the CCM and resident's note.  Sherene Plancarte, MD 06/24/2019  

## 2019-06-24 NOTE — Chronic Care Management (AMB) (Addendum)
Chronic Care Management   Initial Visit Note  06/24/2019 Name: Sharon Vasquez [name]: [id number] DOB: 1950-11-20  Referred by: Sharon Seat, MD Reason for referral : Chronic Care Management (COPD, HF)   Sharon Vasquez is a 69 y.o. year old female who is a primary care patient of Sharon Seat, MD. The CCM team was consulted for assistance with chronic disease management and care coordination needs related to Atrial Fibrillation, CHF, HTN and COPD  Review of patient status, including review of consultants reports, relevant laboratory and other test results, and collaboration with appropriate care team members and the patient's provider was performed as part of comprehensive patient evaluation and provision of chronic care management services.    SDOH (Social Determinants of Health) assessments performed: No See Care Plan activities for detailed interventions related to SDOH     Medications: Outpatient Encounter Medications as of 06/24/2019  Medication Sig Note  . albuterol (PROAIR HFA) 108 (90 Base) MCG/ACT inhaler Inhale 2 puffs into the lungs every 6 (six) hours as needed for wheezing or shortness of breath.   [name] albuterol (PROVENTIL) (2.5 MG/3ML) 0.083% nebulizer solution TAKE 3 MLS(1 AMPULE) BY NEBULIZATION EVERY 4 HOURS AS NEEDED FOR Sharon Vasquez OR SHORTNESS OF BREATH 06/24/2019: Use once every two weeks  . amiodarone (PACERONE) 200 MG tablet Take 0.5 tablets (100 mg total) by mouth daily.   [name] aspirin EC 81 MG tablet Take 81 mg by mouth daily.   [name] atorvastatin (LIPITOR) 40 MG tablet Take 1 tablet (40 mg total) by mouth daily.   . benazepril (LOTENSIN) 40 MG tablet Take 1 tablet (40 mg total) by mouth daily.   [name] esomeprazole (NEXIUM) 40 MG capsule Take 1 capsule (40 mg total) by mouth daily as needed (for heartburn or indigestion).   . Fluticasone-Salmeterol (ADVAIR DISKUS) 250-50 MCG/DOSE AEPB Inhale 1 puff into the lungs 2 (two) times daily. Rinse mouth after each use   . gabapentin  (NEURONTIN) 400 MG capsule Take 1 capsule (400 mg total) by mouth 2 (two) times daily.   . hydrALAZINE (APRESOLINE) 50 MG tablet Take 0.5 tablets (25 mg total) by mouth 3 (three) times daily.   . metoprolol succinate (TOPROL XL) 100 MG 24 hr tablet Take 1 tablet (100 mg total) by mouth daily. Take with or immediately following a meal.   . oxyCODONE-acetaminophen (PERCOCET/ROXICET) 5-325 MG tablet Take 1 tablet by mouth daily as needed for severe pain. 06/24/2019: Takes every 6 hours round the clock  . potassium chloride (KLOR-CON) 10 MEQ tablet TAKE 2 TABLETS(20 MEQ) BY MOUTH TWICE DAILY   . rivaroxaban (XARELTO) 20 MG TABS tablet Take 1 tablet (20 mg total) by mouth every morning.   [name] SPIRIVA HANDIHALER 18 MCG inhalation capsule PLACE 1 CAPSULE INTO INHALER AND INHALE DAILY   . torsemide (DEMADEX) 20 MG tablet TAKE 2 TABLETS BY MOUTH DAILY    No facility-administered encounter medications on file as of 06/24/2019.     Objective:  Wt Readings from Last 3 Encounters:  06/05/19 (!) 362 lb 3.2 oz (164.3 kg)  01/20/19 (!) 351 lb 1.6 oz (159.3 kg)  08/29/18 (!) 334 lb 1.6 oz (151.5 kg)   Echocardiogram of 06/04/18-The left ventricle has normal systolic function with an ejection  fraction of 60-65%   Goals Addressed            This Visit's Progress     Patient Stated   . "I try to do what my doctors tell me to do" (pt-stated)  CARE PLAN ENTRY (see longitudinal plan of care for additional care plan information)  Current Barriers:  . Chronic Disease Management support, education, and care coordination needs related to Atrial Fibrillation, CHF, HTN, and COPD- patient states she feels her weight gain during the pandemic has caused her breathing to be worse at times, she refused the offer of dietary counseling with the clinic dietician, she voiced good knowledge of her COPD action plan   Clinical Goal(s) related to Atrial Fibrillation, CHF, HTN, and COPD:  Over the next 30 days, patient  will:  . Work with the care management team to address educational, disease management, and care coordination needs  . Begin or continue self health monitoring activities as directed today Measure and record blood pressure 5-7 times per week and Measure and record weight daily . Call provider office for new or worsened signs and symptoms Blood pressure findings outside established parameters, Weight outside established parameters, Shortness of breath, and New or worsened symptom related to CHF and COPD . Call care management team with questions or concerns . Verbalize basic understanding of patient centered plan of care established today  Interventions related to Atrial Fibrillation, CHF, HTN, and COPD:  . Evaluation of current treatment plans and patient's adherence to plan as established by provider . Assessed patient understanding of disease states . Assessed patient's education and care coordination needs . Provided disease specific education to patient - reviewed action plan for both COPD and Heart Failure . Mailed Sharon Vasquez calendar with COPD and HF action plan and [employer] information to patient's home address  . Discussed weight loss strategies and mailed Emmi education Weight Loss Tips to patient's home . Will investigate getting patient scales for home use- asked patient to call benefits at Sharon Vasquez Medicare to see if home scales are a covered benefit . Collaborated with appropriate clinical care team members regarding patient needs- offered nutritional counseling via clinic dietician; patient refused  Patient Self Care Activities related to Atrial Fibrillation, CHF, HTN, and COPD:  . Patient is unable to independently self-manage chronic health conditions  Initial goal documentation         Sharon Vasquez. Sharon Vasquez was given information about Chronic Care Management services today including:  1. CCM service includes personalized support from designated clinical staff  supervised by her physician, including individualized plan of care and coordination with other care providers 2. 24/7 contact phone numbers for assistance for urgent and routine care needs. 3. Service will only be billed when office clinical staff spend 20 minutes or more in a month to coordinate care. 4. Only one practitioner may furnish and bill the service in a calendar month. 5. The patient may stop CCM services at any time (effective at the end of the month) by phone call to the office staff. 6. The patient will be responsible for cost sharing (co-pay) of up to 20% of the service fee (after annual deductible is met).  Patient agreed to services and verbal consent obtained.   Plan:   The care management team will reach out to the patient again over the next 30 days.   Kelli Churn RN, CCM, [facility] RN Care Manager [phone]

## 2019-06-24 NOTE — Progress Notes (Signed)
Internal Medicine Clinic Resident  I have personally reviewed this encounter including the documentation in this note and/or discussed this patient with the care management provider. I will address any urgent items identified by the care management provider and will communicate my actions to the patient's PCP. I have reviewed the patient's CCM visit with my supervising attending.  Annslee Tercero, MD 06/24/2019    

## 2019-06-24 NOTE — Patient Instructions (Signed)
Visit Information It was nice speaking with you today.  Goals Addressed            This Visit's Progress     Patient Stated   . "I try to do what my doctors tell me to do" (pt-stated)       [place] (see longitudinal plan of care for additional care plan information)  Current Barriers:  . Chronic Disease Management support, education, and care coordination needs related to Atrial Fibrillation, CHF, HTN, and COPD- patient states she feels her weight gain during the pandemic has caused her breathing to be worse at times, she refused the offer of dietary counseling with the clinic dietician, she voiced good knowledge of the   Clinical Goal(s) related to Atrial Fibrillation, CHF, HTN, and COPD:  Over the next 30 days, patient will:  . Work with the care management team to address educational, disease management, and care coordination needs  . Begin or continue self health monitoring activities as directed today Measure and record blood pressure 5-7 times per week and Measure and record weight daily . Call provider office for new or worsened signs and symptoms Blood pressure findings outside established parameters, Weight outside established parameters, Shortness of breath, and New or worsened symptom related to CHF and COPD . Call care management team with questions or concerns . Verbalize basic understanding of patient centered plan of care established today  Interventions related to Atrial Fibrillation, CHF, HTN, and COPD:  . Evaluation of current treatment plans and patient's adherence to plan as established by provider . Assessed patient understanding of disease states . Assessed patient's education and care coordination needs . Provided disease specific education to patient - reviewed action plan for both COPD and Heart Failure . Mailed [facility] calendar with COPD and HF action plan and [place] information to patient's home address   . Discussed weight loss strategies and mailed Emmi education Weight Loss Tips to patient's home . Will investigate getting patient scales for home use . Collaborated with appropriate clinical care team members regarding patient needs- offered nutritional counseling via clinic dietician; patient refused  Patient Self Care Activities related to Atrial Fibrillation, CHF, HTN, and COPD:  . Patient is unable to independently self-manage chronic health conditions  Initial goal documentation        Sharon Vasquez was given information about Chronic Care Management services today including:  1. CCM service includes personalized support from designated clinical staff supervised by her physician, including individualized plan of care and coordination with other care providers 2. 24/7 contact phone numbers for assistance for urgent and routine care needs. 3. Service will only be billed when office clinical staff spend 20 minutes or more in a month to coordinate care. 4. Only one practitioner may furnish and bill the service in a calendar month. 5. The patient may stop CCM services at any time (effective at the end of the month) by phone call to the office staff. 6. The patient will be responsible for cost sharing (co-pay) of up to 20% of the service fee (after annual deductible is met).  Patient agreed to services and verbal consent obtained.   The patient verbalized understanding of instructions provided today and declined a print copy of patient instruction materials.   The care management team will reach out to the patient again over the next 30 days.   Kelli Churn RN, CCM, [facility] RN Care Manager [phone]

## 2019-06-25 ENCOUNTER — Telehealth: Payer: Medicare Other

## 2019-06-25 NOTE — Progress Notes (Signed)
Internal Medicine Clinic Attending  CCM services provided by the care management provider and their documentation were discussed with Dr. Berline Lopes. We reviewed the pertinent findings, urgent action items addressed by the resident and non-urgent items to be addressed by the PCP.  I agree with the assessment, diagnosis, and plan of care documented in the CCM and resident's note.  Larey Dresser, MD 06/25/2019

## 2019-06-26 ENCOUNTER — Ambulatory Visit: Payer: Self-pay

## 2019-06-26 DIAGNOSIS — J439 Emphysema, unspecified: Secondary | ICD-10-CM

## 2019-06-26 DIAGNOSIS — I739 Peripheral vascular disease, unspecified: Secondary | ICD-10-CM

## 2019-06-26 NOTE — Chronic Care Management (AMB) (Signed)
Care Management   Follow Up Note   06/26/2019 Name: Sharon Vasquez [name]: [id number] DOB: 03-Feb-1951  Referred by: Sharon Seat, MD Reason for referral : Care Coordination (Medicaid application, DME)   Sharon Vasquez is a 69 y.o. year old female who is a primary care patient of Sharon Seat, MD. The care management team was consulted for assistance with care management and care coordination needs.    Review of patient status, including review of consultants reports, relevant laboratory and other test results, and collaboration with appropriate care team members and the patient's provider was performed as part of comprehensive patient evaluation and provision of chronic care management services.    SDOH (Social Determinants of Health) assessments performed: No See Care Plan activities for detailed interventions related to Sharon Vasquez & Sharon Vasquez)     Advanced Directives: See Care Plan and [employer] application for related entries.   Goals Addressed            This Visit's Progress   . " I told my doctor I need help in my home" (pt-stated)   On track    [place] (see longitudinal plan of care for additional care plan information)   Current Barriers:  . Chronic Disease Management support, education, and care coordination needs related to Atrial Fibrillation, CHF, HTN, and COPD  Case Manager Clinical Goal(s):  Sharon Vasquez Over the next 30 days, patient will work with BSW to address needs related to Inability to perform ADL's independently and Inability to perform IADL's independently in patient with Atrial Fibrillation, CHF, HTN, and COPD  Interventions:  . Collaborated with BSW to initiate plan of care to address needs related to Inability to perform ADL's independently, Inability to perform IADL's independently in patient with Atrial Fibrillation, CHF, HTN, and COPD  Patient Self Care Activities:  . Self administers medications as prescribed . Attends all scheduled provider appointments .  Calls pharmacy for medication refills . Performs ADL's independently . Performs IADL's independently . Calls provider office for new concerns or questions  Initial goal documentation     . "I need a scooter to help me get around"       Current Barriers:  Sharon Vasquez Knowledge Barriers related to resources and support available to address needs related to Inability to perform ADL's independently; need for DME.  Case Manager Clinical Goal(s):  Sharon Vasquez Over the next 30 days, patient will work with BSW to address needs related to Inability to perform ADL's independently, need for DME.   . Over the next 30 days, BSW will collaborate with RN Care Manager to address care management and care coordination needs  Interventions:  . Collaborated with RN, Howell Rucks regarding patient's request for scooter.  Per [place], insurance does not cover any portion of cost of scooter.  . Informed patient of above information.  Patient stated that she contacted her insurance company and was told they would cover 80% of cost, however, she was unsure if she inquired about scooter or wheelchair.  Patient stated that she would call again.  Patient Self Care Activities:  . Attends all scheduled provider appointments . Calls provider office for new concerns or questions  Please see past updates related to this goal by clicking on the "Past Updates" button in the selected goal      . "I need help applying for Medicaid" (pt-stated)       Current Barriers:  Sharon Vasquez Knowledge Barriers of resources and support to address financial constraints related to cost of personal care  services, ADL IADL limitations, and limited access to caregiver  Case Manager Clinical Goal(s):  Sharon Vasquez Over the next 90 days, patient will work with BSW to address financial constraints related to cost of personal care services not covered by current insurance, applying for Medicaid  . Over the next 90 days, BSW will collaborate with RN Care Manager to address care  management and care coordination needs  Interventions:  . Incoming call from patient stating that she has not received Medicaid application.  Informed her that it was mailed and she should receive it during the next few days.  Instructed patient to call me back if not received by beginning of next week.   Patient Self Care Activities:  . Attends all scheduled provider appointments . Calls provider office for new concerns or questions  Please see past updates related to this goal by clicking on the "Past Updates" button in the selected goal          Telephone follow up appointment with care management team member scheduled for:07/07/19     Sharon Vasquez, [place] Worker [place] [phone]

## 2019-06-26 NOTE — Patient Instructions (Signed)
Visit Information  Goals Addressed            This Visit's Progress   . " I told my doctor I need help in my home" (pt-stated)   On track    [place] (see longitudinal plan of care for additional care plan information)   Current Barriers:  . Chronic Disease Management support, education, and care coordination needs related to Atrial Fibrillation, CHF, HTN, and COPD  Case Manager Clinical Goal(s):  [name] Over the next 30 days, patient will work with BSW to address needs related to Inability to perform ADL's independently and Inability to perform IADL's independently in patient with Atrial Fibrillation, CHF, HTN, and COPD  Interventions:  . Collaborated with BSW to initiate plan of care to address needs related to Inability to perform ADL's independently, Inability to perform IADL's independently in patient with Atrial Fibrillation, CHF, HTN, and COPD  Patient Self Care Activities:  . Self administers medications as prescribed . Attends all scheduled provider appointments . Calls pharmacy for medication refills . Performs ADL's independently . Performs IADL's independently . Calls provider office for new concerns or questions  Initial goal documentation     . "I need a scooter to help me get around"       Current Barriers:  [name] Knowledge Barriers related to resources and support available to address needs related to Inability to perform ADL's independently; need for DME.  Case Manager Clinical Goal(s):  [name] Over the next 30 days, patient will work with BSW to address needs related to Inability to perform ADL's independently, need for DME.   . Over the next 30 days, BSW will collaborate with RN Care Manager to address care management and care coordination needs  Interventions:  . Collaborated with RN, Howell Rucks regarding patient's request for scooter.  Per [place], insurance does not cover any portion of cost of scooter.  . Informed patient of above information.   Patient stated that she contacted her insurance company and was told they would cover 80% of cost, however, she was unsure if she inquired about scooter or wheelchair.  Patient stated that she would call again.  Patient Self Care Activities:  . Attends all scheduled provider appointments . Calls provider office for new concerns or questions  Please see past updates related to this goal by clicking on the "Past Updates" button in the selected goal      . "I need help applying for Medicaid" (pt-stated)       Current Barriers:  [name] Knowledge Barriers of resources and support to address financial constraints related to cost of personal care services, ADL IADL limitations, and limited access to caregiver  Case Manager Clinical Goal(s):  [name] Over the next 90 days, patient will work with BSW to address financial constraints related to cost of personal care services not covered by current insurance, applying for Medicaid  . Over the next 90 days, BSW will collaborate with RN Care Manager to address care management and care coordination needs  Interventions:  . Incoming call from patient stating that she has not received Medicaid application.  Informed her that it was mailed and she should receive it during the next few days.  Instructed patient to call me back if not received by beginning of next week.   Patient Self Care Activities:  . Attends all scheduled provider appointments . Calls provider office for new concerns or questions  Please see past updates related to this goal by clicking on  the "Past Updates" button in the selected goal         Patient verbalizes understanding of instructions provided today.   Face to Face appointment with care management team member scheduled for: 07/07/19   Ronn Melena, [place] Coordination Social Worker [place] [phone]

## 2019-06-27 ENCOUNTER — Telehealth: Payer: Self-pay

## 2019-06-27 NOTE — Progress Notes (Signed)
Internal Medicine Clinic Resident  I have personally reviewed this encounter including the documentation in this note and/or discussed this patient with the care management provider. I will address any urgent items identified by the care management provider and will communicate my actions to the patient's PCP. I have reviewed the patient's CCM visit with my supervising attending.  Marquetta Weiskopf, MD 06/27/2019    

## 2019-06-27 NOTE — Telephone Encounter (Signed)
06/27/2019 Spoke with patient she has received her Medicaid application signed it and her daughter is going to drop it off for her.  She does not have any other needs.  Closing referral. Ambrose Mantle [phone]

## 2019-06-29 ENCOUNTER — Other Ambulatory Visit: Payer: Self-pay

## 2019-06-29 ENCOUNTER — Emergency Department (HOSPITAL_COMMUNITY)
Admission: EM | Admit: 2019-06-29 | Discharge: 2019-06-30 | Disposition: A | Discharge status: Home / Self Care | Payer: Medicare Other | Attending: Emergency Medicine | Admitting: Emergency Medicine

## 2019-06-29 ENCOUNTER — Encounter (HOSPITAL_COMMUNITY): Payer: Self-pay

## 2019-06-29 ENCOUNTER — Emergency Department (HOSPITAL_COMMUNITY): Payer: Medicare Other

## 2019-06-29 DIAGNOSIS — Z86718 Personal history of other venous thrombosis and embolism: Secondary | ICD-10-CM | POA: Diagnosis not present

## 2019-06-29 DIAGNOSIS — I10 Essential (primary) hypertension: Secondary | ICD-10-CM | POA: Insufficient documentation

## 2019-06-29 DIAGNOSIS — R600 Localized edema: Secondary | ICD-10-CM | POA: Insufficient documentation

## 2019-06-29 DIAGNOSIS — R0602 Shortness of breath: Secondary | ICD-10-CM | POA: Diagnosis not present

## 2019-06-29 DIAGNOSIS — I509 Heart failure, unspecified: Secondary | ICD-10-CM | POA: Diagnosis not present

## 2019-06-29 DIAGNOSIS — Z743 Need for continuous supervision: Secondary | ICD-10-CM | POA: Diagnosis not present

## 2019-06-29 DIAGNOSIS — I110 Hypertensive heart disease with heart failure: Secondary | ICD-10-CM | POA: Insufficient documentation

## 2019-06-29 DIAGNOSIS — R42 Dizziness and giddiness: Secondary | ICD-10-CM | POA: Insufficient documentation

## 2019-06-29 DIAGNOSIS — G4489 Other headache syndrome: Secondary | ICD-10-CM | POA: Diagnosis not present

## 2019-06-29 DIAGNOSIS — R519 Headache, unspecified: Secondary | ICD-10-CM | POA: Insufficient documentation

## 2019-06-29 DIAGNOSIS — R0902 Hypoxemia: Secondary | ICD-10-CM | POA: Diagnosis not present

## 2019-06-29 DIAGNOSIS — Z79899 Other long term (current) drug therapy: Secondary | ICD-10-CM | POA: Diagnosis not present

## 2019-06-29 DIAGNOSIS — Z86711 Personal history of pulmonary embolism: Secondary | ICD-10-CM | POA: Insufficient documentation

## 2019-06-29 DIAGNOSIS — Z720 Tobacco use: Secondary | ICD-10-CM | POA: Insufficient documentation

## 2019-06-29 DIAGNOSIS — J441 Chronic obstructive pulmonary disease with (acute) exacerbation: Secondary | ICD-10-CM

## 2019-06-29 DIAGNOSIS — R52 Pain, unspecified: Secondary | ICD-10-CM | POA: Diagnosis not present

## 2019-06-29 DIAGNOSIS — Z7982 Long term (current) use of aspirin: Secondary | ICD-10-CM | POA: Insufficient documentation

## 2019-06-29 DIAGNOSIS — Z6841 Body Mass Index (BMI) 40.0 and over, adult: Secondary | ICD-10-CM | POA: Diagnosis not present

## 2019-06-29 MED ORDER — IPRATROPIUM BROMIDE 0.02 % IN SOLN
1.0000 mg | Freq: Once | RESPIRATORY_TRACT | Status: AC
  Administered 2019-06-30: 1 mg via RESPIRATORY_TRACT
  Filled 2019-06-29: qty 5

## 2019-06-29 MED ORDER — ALBUTEROL SULFATE (2.5 MG/3ML) 0.083% IN NEBU
5.0000 mg | INHALATION_SOLUTION | Freq: Once | RESPIRATORY_TRACT | Status: AC
  Administered 2019-06-30: 5 mg via RESPIRATORY_TRACT
  Filled 2019-06-29: qty 6

## 2019-06-29 NOTE — ED Provider Notes (Signed)
TIME SEEN: 11:44 PM  CHIEF COMPLAINT: Headache, exertional shortness of breath, lower extremity swelling  HPI: Is a 69 year old female with history of CHF on torsemide 40 mg daily, COPD not on home oxygen, morbid obesity, atrial fibrillation, PE and DVT on Xarelto, hypertension, hyperlipidemia who presents to the emergency department with multiple complaints.  History is limited as patient is an extremely poor historian.  She initially tells me that she is here because of her blood pressure.  Reports normalized in the 130s to 140/80s and was as high as the 123456 systolic tonight.  She reports compliance with her blood pressure medications and no changes.  Took metoprolol and torsemide prior to arrival.  It appears she is on hydralazine, benazepril, metoprolol, torsemide.  She also complains of frontal, gradual onset headache for the past 2 hours.  She states that she does not normally get headaches but then tells me that she will have similar headaches when her blood pressure is elevated.  No head injury, numbness, focal weakness.  When asked if she is having shortness of breath as stated in the triage note, patient reports that it is chronic.  She tells me she has no chest pain.  Patient then tells me that she feels that she could just get the fluid out of her legs and lungs that she would feel better.  When asked again if she is short of breath she states that it is worse over the past year and worse with exertion.  No fevers.  No cough.  Has been using nebulizer treatments at home without relief.  ROS: See HPI Constitutional: no fever  Eyes: no drainage  ENT: no runny nose   Cardiovascular:  no chest pain  Resp:  SOB  GI: no vomiting GU: no dysuria Integumentary: no rash  Allergy: no hives  Musculoskeletal:  leg swelling  Neurological: no slurred speech ROS otherwise negative  PAST MEDICAL HISTORY/PAST SURGICAL HISTORY:  Past Medical History:  Diagnosis Date  . A-fib ([place])   . Anxiety    . Arthritis    "qwhre; joints, back" (04/17/2017)  . Benign breast cyst in female, left 01/08/2017   Found by Screening mammogram, evaluated by [facility] on 01/08/17 and determined to be a benign simple breast cyst.  . Cellulitis of left lower leg 05/30/2017  . CHF (congestive heart failure) ([place])   . Chronic low back pain 08/21/2016  . Chronic lower back pain   . Chronic venous insufficiency    [name] 05/30/2017  . COPD (chronic obstructive pulmonary disease) ([place])   . Depression   . DVT (deep venous thrombosis) ([place]) 11/16/2016  . GERD (gastroesophageal reflux disease)   . Headache    "weekly for the last 3 months" (04/17/2017)  . Hyperlipidemia   . Hypertension   . Morbid obesity ([place])   . PE (pulmonary embolism)   . Pulmonary embolism ([place]) 09/21/2014    MEDICATIONS:  Prior to Admission medications   Medication Sig Start Date End Date Taking? Authorizing Provider  albuterol (PROAIR HFA) 108 (90 Base) MCG/ACT inhaler Inhale 2 puffs into the lungs every 6 (six) hours as needed for wheezing or shortness of breath. 08/29/18   Neva Seat, MD  albuterol (PROVENTIL) (2.5 MG/3ML) 0.083% nebulizer solution TAKE 3 MLS(1 AMPULE) BY NEBULIZATION EVERY 4 HOURS AS NEEDED FOR [facility] OR SHORTNESS OF BREATH 01/20/19   Neva Seat, MD  amiodarone (PACERONE) 200 MG tablet Take 0.5 tablets (100 mg total) by mouth daily. 08/29/18   Neva Seat, MD  aspirin EC 81 MG tablet Take 81 mg by mouth daily.    [provider]  atorvastatin (LIPITOR) 40 MG tablet Take 1 tablet (40 mg total) by mouth daily. 11/15/18   Neva Seat, MD  benazepril (LOTENSIN) 40 MG tablet Take 1 tablet (40 mg total) by mouth daily. 06/05/19 06/04/20  Neva Seat, MD  esomeprazole (NEXIUM) 40 MG capsule Take 1 capsule (40 mg total) by mouth daily as needed (for heartburn or indigestion). 01/20/19   Neva Seat, MD  Fluticasone-Salmeterol (ADVAIR DISKUS) 250-50 MCG/DOSE AEPB Inhale 1 puff into the lungs  2 (two) times daily. Rinse mouth after each use 01/20/19   Neva Seat, MD  gabapentin (NEURONTIN) 400 MG capsule Take 1 capsule (400 mg total) by mouth 2 (two) times daily. 01/20/19   Neva Seat, MD  hydrALAZINE (APRESOLINE) 50 MG tablet Take 0.5 tablets (25 mg total) by mouth 3 (three) times daily. 06/05/19   Neva Seat, MD  metoprolol succinate (TOPROL XL) 100 MG 24 hr tablet Take 1 tablet (100 mg total) by mouth daily. Take with or immediately following a meal. 01/20/19   Neva Seat, MD  oxyCODONE-acetaminophen (PERCOCET/ROXICET) 5-325 MG tablet Take 1 tablet by mouth daily as needed for severe pain. 09/26/18   Neva Seat, MD  potassium chloride (KLOR-CON) 10 MEQ tablet TAKE 2 TABLETS(20 MEQ) BY MOUTH TWICE DAILY 04/21/19   Lorretta Harp, MD  rivaroxaban (XARELTO) 20 MG TABS tablet Take 1 tablet (20 mg total) by mouth every morning. 09/26/18   Neva Seat, MD  [facility] HANDIHALER 18 MCG inhalation capsule PLACE 1 CAPSULE INTO INHALER AND INHALE DAILY 01/10/19   Martyn Ehrich, NP  torsemide (DEMADEX) 20 MG tablet TAKE 2 TABLETS BY MOUTH DAILY 03/21/19   Lorretta Harp, MD    ALLERGIES:  No Known Allergies  SOCIAL HISTORY:  Social History   Tobacco Use  . Smoking status: Current Some Day Smoker    Packs/day: 0.10    Years: 55.00    Pack years: 5.50    Types: Cigarettes    Start date: 08/16/1961  . Smokeless tobacco: Never Used  . Tobacco comment: 25 cigs per day  Substance Use Topics  . Alcohol use: No    FAMILY HISTORY: Family History  Problem Relation Age of Onset  . Breast cancer Mother   . Hypertension Mother   . Hyperlipidemia Mother   . Hypertension Maternal Grandfather   . Hyperlipidemia Maternal Grandfather     EXAM: BP (!) 156/110 (BP Location: Right Arm)   Pulse 79   Temp 98.7 F (37.1 C) (Oral)   Resp (!) 23   Ht 6\' 2"  (1.88 m)   Wt (!) 158.8 kg   SpO2 96%   BMI 44.94 kg/m  CONSTITUTIONAL: Alert and oriented  and responds appropriately to questions.  Chronically ill-appearing.  Morbidly obese. HEAD: Normocephalic EYES: Conjunctivae clear, pupils appear equal, EOM appear intact ENT: normal nose; moist mucous membranes NECK: Supple, normal ROM CARD: RRR; S1 and S2 appreciated; no murmurs, no clicks, no rubs, no gallops RESP: Patient is mildly tachypneic.  She does appear short of breath when talking to me but no oxygen requirement.  She does have diffuse inspiratory and expiratory wheezes.  Auscultation limited due to body habitus.  I do not appreciate rhonchi or rales. ABD/GI: Normal bowel sounds; non-distended; soft, non-tender, no rebound, no guarding, no peritoneal signs, no hepatosplenomegaly BACK:  The back appears normal EXT: Normal ROM in all joints; no deformity noted, nonpitting edema  to knees bilaterally, extremities warm and well perfused, no calf tenderness, compartments of the lower extremities are soft SKIN: Normal color for age and race; warm; no rash on exposed skin NEURO: Moves all extremities equally, speech normal, no facial asymmetry, reports normal sensation diffusely PSYCH: The patient's mood and manner are appropriate.   MEDICAL DECISION MAKING: Patient here with complaints of headache, elevated blood pressure.  Blood pressure elevated here.  She just took 2 doses of her metoprolol and torsemide prior to arrival.  We will continue to monitor.  Offered her something for her headache which she declined stating that it is getting better already.  No focal neurologic deficits.  Will obtain head CT to rule out intracranial hemorrhage given onset of headache 2 hours ago but suspicion at this time is low.  Doubt stroke.  No fever or meningismus on exam.  Doubt meningitis.  Will check labs, urine to evaluate for any signs of endorgan damage from hypertension.  Patient also complaining of shortness of breath and does appear volume overloaded on exam.  It is very difficult to ascertain from  this patient whether or not the shortness of breath is chronic for her or not.  She denies any chest pain.  Differential includes CHF exacerbation, COPD.  No fever or cough to suggest pneumonia.  No sick contacts.  Deconditioning and morbid obesity are likely contributing.  Will give nebulizer treatment and reassess.  Will obtain labs and chest x-ray.  EKG shows no new ischemic change.  ED PROGRESS: Patient's labs, urine, chest x-ray, head CT were reassuring.  Headache is completely resolved.  Blood pressure is now 170/90s.  Will give her nighttime dose of hydralazine prior to discharge.  Chest x-ray shows no sign of volume overload and BNP is 107.  Suspect this is more COPD exacerbation and lungs are now clear after 2 breathing treatments.  States that she does not ambulate normally at home.  She is not hypoxic at rest.  She has received 2 nebulizer treatments and a dose of IV Solu-Medrol.  Will discharge with albuterol inhaler, prednisone burst.   At this time, I do not feel there is any life-threatening condition present. I have reviewed, interpreted and discussed all results (EKG, imaging, lab, urine as appropriate) and exam findings with patient/family. I have reviewed nursing notes and appropriate previous records.  I feel the patient is safe to be discharged home without further emergent workup and can continue workup as an outpatient as needed. Discussed usual and customary return precautions. Patient/family verbalize understanding and are comfortable with this plan.  Outpatient follow-up has been provided as needed. All questions have been answered.     EKG Interpretation  Date/Time:  Sunday June 29 2019 23:24:05 EDT Ventricular Rate:  69 PR Interval:    QRS Duration: 101 QT Interval:  525 QTC Calculation: 563 R Axis:   0 Text Interpretation: Normal sinus rhythm Indeterminate axis Abnormal R-wave progression, early transition Borderline T wave abnormalities Prolonged QT interval No  significant change since last tracing Confirmed by Pryor Curia [phone]) on 06/29/2019 11:26:09 PM          [name] was evaluated in Emergency Department on 06/29/2019 for the symptoms described in the history of present illness. She was evaluated in the context of the global COVID-19 pandemic, which necessitated consideration that the patient might be at risk for infection with the SARS-CoV-2 virus that causes COVID-19. Institutional protocols and algorithms that pertain to the evaluation of patients at risk  for COVID-19 are in a state of rapid change based on information released by regulatory bodies including the CDC and federal and state organizations. These policies and algorithms were followed during the patient's care in the ED.  Patient was seen wearing N95, face shield, gloves.    Aamina Skiff, Delice Bison, DO 06/30/19 [phone]

## 2019-06-29 NOTE — ED Triage Notes (Addendum)
Pt. C/o high b/p for several hours prior to EMS arrival w/ headache, and lightheadedness and SOB on exertion. EMS checked the reading and calculated 250/116. Pt. took b/p meds (Metoprolol x2, and Torsemide) EMS administed  10L of O2 nonrebreather. Pt. Currently 97 RA

## 2019-06-30 ENCOUNTER — Ambulatory Visit: Payer: Medicare Other | Admitting: *Deleted

## 2019-06-30 ENCOUNTER — Emergency Department (HOSPITAL_COMMUNITY): Payer: Medicare Other

## 2019-06-30 DIAGNOSIS — I10 Essential (primary) hypertension: Secondary | ICD-10-CM

## 2019-06-30 DIAGNOSIS — G894 Chronic pain syndrome: Secondary | ICD-10-CM

## 2019-06-30 DIAGNOSIS — J441 Chronic obstructive pulmonary disease with (acute) exacerbation: Secondary | ICD-10-CM | POA: Diagnosis not present

## 2019-06-30 DIAGNOSIS — I5032 Chronic diastolic (congestive) heart failure: Secondary | ICD-10-CM

## 2019-06-30 LAB — BASIC METABOLIC PANEL
Anion gap: 13 (ref 5–15)
BUN: 13 mg/dL (ref 8–23)
CO2: 28 mmol/L (ref 22–32)
Calcium: 9.9 mg/dL (ref 8.9–10.3)
Chloride: 104 mmol/L (ref 98–111)
Creatinine, Ser: 0.97 mg/dL (ref 0.44–1.00)
GFR calc Af Amer: >60 mL/min (ref >60)
GFR calc non Af Amer: 60 mL/min — ABNORMAL LOW (ref >60)
Glucose, Bld: 111 mg/dL — ABNORMAL HIGH (ref 70–99)
Potassium: 4.1 mmol/L (ref 3.5–5.1)
Sodium: 145 mmol/L (ref 135–145)

## 2019-06-30 LAB — CBC
HCT: 41.1 % (ref 36.0–46.0)
Hemoglobin: 13.5 g/dL (ref 12.0–15.0)
MCH: 29.5 pg (ref 26.0–34.0)
MCHC: 32.8 g/dL (ref 30.0–36.0)
MCV: 89.7 fL (ref 80.0–100.0)
Platelets: 296 10*3/uL (ref 150–400)
RBC: 4.58 MIL/uL (ref 3.87–5.11)
RDW: 13.5 % (ref 11.5–15.5)
WBC: 5.9 10*3/uL (ref 4.0–10.5)
nRBC: 0 % (ref 0.0–0.2)

## 2019-06-30 LAB — URINALYSIS, ROUTINE W REFLEX MICROSCOPIC
Bilirubin Urine: NEGATIVE
Glucose, UA: NEGATIVE mg/dL
Hgb urine dipstick: NEGATIVE
Ketones, ur: NEGATIVE mg/dL
Leukocytes,Ua: NEGATIVE
Nitrite: NEGATIVE
Protein, ur: NEGATIVE mg/dL
Specific Gravity, Urine: 1.004 — ABNORMAL LOW (ref 1.005–1.030)
pH: 6 (ref 5.0–8.0)

## 2019-06-30 LAB — BRAIN NATRIURETIC PEPTIDE: B Natriuretic Peptide: 107.4 pg/mL — ABNORMAL HIGH (ref 0.0–100.0)

## 2019-06-30 LAB — TROPONIN I (HIGH SENSITIVITY): Troponin I (High Sensitivity): 8 ng/L (ref <18)

## 2019-06-30 MED ORDER — METOPROLOL SUCCINATE ER 100 MG PO TB24: 100.0000 mg | ORAL_TABLET | Freq: Every day | ORAL | 2 refills | Status: DC

## 2019-06-30 MED ORDER — ALBUTEROL SULFATE (2.5 MG/3ML) 0.083% IN NEBU
5.0000 mg | INHALATION_SOLUTION | Freq: Once | RESPIRATORY_TRACT | Status: AC
  Administered 2019-06-30: 5 mg via RESPIRATORY_TRACT
  Filled 2019-06-30: qty 6

## 2019-06-30 MED ORDER — METHYLPREDNISOLONE SODIUM SUCC 125 MG IJ SOLR
125.0000 mg | Freq: Once | INTRAMUSCULAR | Status: AC
  Administered 2019-06-30: 125 mg via INTRAVENOUS
  Filled 2019-06-30: qty 2

## 2019-06-30 MED ORDER — HYDRALAZINE HCL 25 MG PO TABS
50.0000 mg | ORAL_TABLET | Freq: Once | ORAL | Status: DC
  Filled 2019-06-30: qty 2

## 2019-06-30 MED ORDER — PREDNISONE 10 MG PO TABS: 20.0000 mg | ORAL_TABLET | Freq: Every day | ORAL | 0 refills | Status: DC

## 2019-06-30 MED ORDER — GABAPENTIN 400 MG PO CAPS: 400.0000 mg | ORAL_CAPSULE | Freq: Two times a day (BID) | ORAL | 2 refills | Status: DC

## 2019-06-30 MED ORDER — ALBUTEROL SULFATE HFA 108 (90 BASE) MCG/ACT IN AERS: 2.0000 | INHALATION_SPRAY | RESPIRATORY_TRACT | 0 refills | Status: DC | PRN

## 2019-06-30 NOTE — ED Notes (Signed)
Pt has repeatedly removed BP cuff, stating that it is too tight.

## 2019-06-30 NOTE — Patient Instructions (Signed)
Visit Information I will notify Dr. Trilby Drummer that you need refills on your blood pressure medications and your gabapentin. I will also check on the status of your scooter.  Goals Addressed            This Visit's Progress     Patient Stated   . "I try to do what my doctors tell me to do" (pt-stated)       [place] (see longitudinal plan of care for additional care plan information)  Current Barriers:  . Chronic Disease Management support, education, and care coordination needs related to Atrial Fibrillation, CHF, HTN, and COPD-returned call to patient - she states she had to go to the emergency department last night because of high blood pressure, she says she had not taken her benazepril for about 1 1/2 weeks because she can't find the pills, she also says she needs refills on her gabapentin and metoprolol. She also asked about getting a scooter and says she will need a letter of medical necessity and a home health  PT/OT evaluation in order for her health insurance to pay 80% of the scooter cost. She says she will use the over the counter benefit to buy scales after 07/03/19.  Clinical Goal(s) related to Atrial Fibrillation, CHF, HTN, and COPD:  Over the next 30 days, patient will:  . Work with the care management team to address educational, disease management, and care coordination needs  . Begin or continue self health monitoring activities as directed today Measure and record blood pressure 5-7 times per week and Measure and record weight daily . Call provider office for new or worsened signs and symptoms Blood pressure findings outside established parameters, Weight outside established parameters, Shortness of breath, and New or worsened symptom related to CHF and COPD . Call care management team with questions or concerns . Verbalize basic understanding of patient centered plan of care established today  Interventions related to Atrial Fibrillation, CHF, HTN, and COPD:   . Collaborated with appropriate clinical care team members regarding patient needs- in basket message sent to DME representative and provider regarding patient's DME requests  Patient Self Care Activities related to Atrial Fibrillation, CHF, HTN, and COPD:  . Patient is unable to independently self-manage chronic health conditions  Please see past updates related to this goal by clicking on the "Past Updates" button in the selected goal         The patient verbalized understanding of instructions provided today and declined a print copy of patient instruction materials.   The care management team will reach out to the patient again over the next 30 days.   Kelli Churn RN, CCM, [facility] RN Care Manager [phone]

## 2019-06-30 NOTE — Chronic Care Management (AMB) (Signed)
  Care Management   Follow Up Note   06/30/2019 Name: Sharon Vasquez [name]: [id number] DOB: 1950-12-24  Referred by: Neva Seat, MD Reason for referral : Chronic Care Management (HTN, COPD, HF)   Sharon Vasquez is a 69 y.o. year old female who is a primary care patient of Neva Seat, MD. The care management team was consulted for assistance with care management and care coordination needs.    Review of patient status, including review of consultants reports, relevant laboratory and other test results, and collaboration with appropriate care team members and the patient's provider was performed as part of comprehensive patient evaluation and provision of chronic care management services.    SDOH (Social Determinants of Health) assessments performed: No See Care Plan activities for detailed interventions related to [facility])     Advanced Directives: See Care Plan and Vynca application for related entries.   Goals Addressed            This Visit's Progress     Patient Stated   . "I try to do what my doctors tell me to do" (pt-stated)       [place] (see longitudinal plan of care for additional care plan information)  Current Barriers:  . Chronic Disease Management support, education, and care coordination needs related to Atrial Fibrillation, CHF, HTN, and COPD-returned call to patient - she states she had to go to the emergency department last night because of high blood pressure, she says she had not taken her benazepril for about 1 1/2 weeks because she can't find the pills, she also says she needs refills on her gabapentin and metoprolol. She also asked about getting a scooter and says she will need a letter of medical necessity and a home health  PT/OT evaluation in order for her health insurance to pay 80% of the scooter cost. She says she will use the over the counter benefit to buy scales after 07/03/19.  Clinical Goal(s) related to Atrial Fibrillation, CHF, HTN,  and COPD:  Over the next 30 days, patient will:  . Work with the care management team to address educational, disease management, and care coordination needs  . Begin or continue self health monitoring activities as directed today Measure and record blood pressure 5-7 times per week and Measure and record weight daily . Call provider office for new or worsened signs and symptoms Blood pressure findings outside established parameters, Weight outside established parameters, Shortness of breath, and New or worsened symptom related to CHF and COPD . Call care management team with questions or concerns . Verbalize basic understanding of patient centered plan of care established today  Interventions related to Atrial Fibrillation, CHF, HTN, and COPD:  . Collaborated with appropriate clinical care team members regarding patient needs- in basket message sent to DME representative and provider regarding patient's DME requests  Patient Self Care Activities related to Atrial Fibrillation, CHF, HTN, and COPD:  . Patient is unable to independently self-manage chronic health conditions  Please see past updates related to this goal by clicking on the "Past Updates" button in the selected goal          The care management team will reach out to the patient again over the next 30 days.   Kelli Churn RN, CCM, [facility] RN Care Manager [phone]

## 2019-06-30 NOTE — Discharge Instructions (Addendum)
Please continue your metoprolol, benazepril, torsemide and hydralazine as prescribed.  I recommend close follow-up with your primary care physician to monitor your blood pressure given it was elevated today.  Your doctor may recommend adjusting some of your medications but I recommend that this be done as an outpatient with someone who follows with you regularly.  Your labs, urine, EKG, chest x-ray and head CT were reassuring.  I suspect that your shortness of breath is secondary to your COPD.  Your lungs did not show any sign of pulmonary edema today.

## 2019-06-30 NOTE — ED Notes (Signed)
Multiple attempts by nurse and IV team made to get blood specimens with no success. Lab tech notified so that they may attempt to get blood.

## 2019-06-30 NOTE — Addendum Note (Signed)
Addended by: Neva Seat B on: 06/30/2019 03:49 PM   Modules accepted: Orders

## 2019-06-30 NOTE — Progress Notes (Addendum)
Internal Medicine Clinic Resident   I have personally reviewed this encounter including the documentation in this note and/or discussed this patient with the care management provider. I will address any urgent items identified by the care management provider and will communicate my actions to the patient's PCP. I have reviewed the patient's CCM visit with my supervising attending.  Refills placed for Gabapentin and Metoprolol. Benazepril was refilled on 06/05/2019.  BP was at goal on current regimen during her last visit with me. Will need to ensure adherence, she has had issues with consistently taking diuretics due to frequently urination and history of incontinence (she requires this for HF).  I am PCP and will be happy to fill out any needed form for her scooter.  Neva Seat, MD 06/30/2019

## 2019-06-30 NOTE — ED Notes (Signed)
Transported to CT 

## 2019-06-30 NOTE — ED Notes (Addendum)
Pt. Refused Hydralazine. Encouragement and education provided. Pt. Still refused, saying she doesn't want to be charge for them when she has some at home.

## 2019-07-01 ENCOUNTER — Ambulatory Visit
Admission: RE | Admit: 2019-07-01 | Discharge: 2019-07-01 | Disposition: A | Discharge status: Home / Self Care | Payer: Medicare Other | Source: Ambulatory Visit | Attending: *Deleted | Admitting: *Deleted

## 2019-07-01 ENCOUNTER — Ambulatory Visit: Payer: Medicare Other | Admitting: *Deleted

## 2019-07-01 ENCOUNTER — Other Ambulatory Visit: Payer: Self-pay

## 2019-07-01 ENCOUNTER — Telehealth: Payer: Self-pay | Admitting: Internal Medicine

## 2019-07-01 DIAGNOSIS — Z1231 Encounter for screening mammogram for malignant neoplasm of breast: Secondary | ICD-10-CM

## 2019-07-01 DIAGNOSIS — I5032 Chronic diastolic (congestive) heart failure: Secondary | ICD-10-CM

## 2019-07-01 DIAGNOSIS — L602 Onychogryphosis: Secondary | ICD-10-CM | POA: Insufficient documentation

## 2019-07-01 DIAGNOSIS — J439 Emphysema, unspecified: Secondary | ICD-10-CM

## 2019-07-01 DIAGNOSIS — I10 Essential (primary) hypertension: Secondary | ICD-10-CM

## 2019-07-01 NOTE — Telephone Encounter (Signed)
See documentation under overgrown toenails.

## 2019-07-01 NOTE — Patient Instructions (Signed)
Visit Information It was nice speaking with you. Please call the clinic and make an appointment to see Dr. Trilby Drummer.   Goals Addressed            This Visit's Progress     Patient Stated   . "I try to do what my doctors tell me to do" (pt-stated)       [place] (see longitudinal plan of care for additional care plan information)  Current Barriers:  . Chronic Disease Management support, education, and care coordination needs related to Atrial Fibrillation, CHF, HTN, and COPD-returned call to patient - spoke with patient, she said she found her benazepril and was appreciative of this RNCM getting refills for her on the other medications she requested (gabapentin and metoprolol) . She also says she wants to consult with Dr Trilby Drummer because the emergency room MD gave her a prescription for prednisone on 06/29/19 and she wants to know if that is OK as she will be taking using steroid gtts for her eye in the next week few weeks due to laser eye surgery for cataracts  Clinical Goal(s) related to Atrial Fibrillation, CHF, HTN, and COPD:  Over the next 30 days, patient will:  . Work with the care management team to address educational, disease management, and care coordination needs  . Begin or continue self health monitoring activities as directed today Measure and record blood pressure 5-7 times per week and Measure and record weight daily . Call provider office for new or worsened signs and symptoms Blood pressure findings outside established parameters, Weight outside established parameters, Shortness of breath, and New or worsened symptom related to CHF and COPD . Call care management team with questions or concerns . Verbalize basic understanding of patient centered plan of care established today  Interventions related to Atrial Fibrillation, CHF, HTN, and COPD:  . Provider is requesting to see patient to discuss PO/OT eval and DME needs so called patient to request she schedule clinic  appointment  Patient Self Care Activities related to Atrial Fibrillation, CHF, HTN, and COPD:  . Patient is unable to independently self-manage chronic health conditions  Please see past updates related to this goal by clicking on the "Past Updates" button in the selected goal         The patient verbalized understanding of instructions provided today and declined a print copy of patient instruction materials.   The care management team will reach out to the patient again over the next 30 days.   Kelli Churn RN, CCM, [facility] RN Care Manager [phone]

## 2019-07-01 NOTE — Progress Notes (Addendum)
Internal Medicine Clinic Resident   I have personally reviewed this encounter including the documentation in this note and/or discussed this patient with the care management provider. I will address any urgent items identified by the care management provider and will communicate my actions to the patient's PCP. I have reviewed the patient's CCM visit with my supervising attending.  I discussed that she can continue her steroid course for suspected COPD exacerbation when I spoke with her earlier today about her need for podiatry referral.  She also knows she needs to be seen for PT evaluation referral.  Neva Seat, MD 07/01/2019

## 2019-07-01 NOTE — Assessment & Plan Note (Signed)
Patient reported overgrown toenails needing trimming to staff earlier in the day. I contacted her by phone to confirm details. She describes all nails are thickened and discolored. They are long, difficult to trim and one is curved into her toe. She does not know if she has been told she has a fungal infection in the past, but it does sound as though this is the case. She has had her two great toenails removed in the past. Will refer to podiatry for trimming and further treatment. - Referral to podiatry

## 2019-07-01 NOTE — Progress Notes (Signed)
Internal Medicine Clinic Attending  CCM services provided by the care management provider and their documentation were discussed with Dr. Melvin. We reviewed the pertinent findings, urgent action items addressed by the resident and non-urgent items to be addressed by the PCP.  I agree with the assessment, diagnosis, and plan of care documented in the CCM and resident's note.  Ricco Dershem, MD 07/01/2019  

## 2019-07-01 NOTE — Chronic Care Management (AMB) (Signed)
Chronic Care Management   Follow Up Note   07/01/2019 Name: Sharon Vasquez [name]: [id number] DOB: 1950-12-12  Referred by: Neva Seat, MD Reason for referral : Chronic Care Management (HTN, COPD, PAF)   Sharon Vasquez is a 69 y.o. year old female who is a primary care patient of Neva Seat, MD. The CCM team was consulted for assistance with chronic disease management and care coordination needs.    Review of patient status, including review of consultants reports, relevant laboratory and other test results, and collaboration with appropriate care team members and the patient's provider was performed as part of comprehensive patient evaluation and provision of chronic care management services.    SDOH (Social Determinants of Health) assessments performed: No See Care Plan activities for detailed interventions related to [facility])     Outpatient Encounter Medications as of 07/01/2019  Medication Sig Note   albuterol (PROAIR HFA) 108 (90 Base) MCG/ACT inhaler Inhale 2 puffs into the lungs every 6 (six) hours as needed for wheezing or shortness of breath.    albuterol (PROVENTIL) (2.5 MG/3ML) 0.083% nebulizer solution TAKE 3 MLS(1 AMPULE) BY NEBULIZATION EVERY 4 HOURS AS NEEDED FOR [facility] OR SHORTNESS OF BREATH 06/24/2019: Use once every two weeks   albuterol (VENTOLIN HFA) 108 (90 Base) MCG/ACT inhaler Inhale 2 puffs into the lungs every 4 (four) hours as needed for wheezing or shortness of breath.    amiodarone (PACERONE) 200 MG tablet Take 0.5 tablets (100 mg total) by mouth daily.    aspirin EC 81 MG tablet Take 81 mg by mouth daily.    atorvastatin (LIPITOR) 40 MG tablet Take 1 tablet (40 mg total) by mouth daily.    benazepril (LOTENSIN) 40 MG tablet Take 1 tablet (40 mg total) by mouth daily.    esomeprazole (NEXIUM) 40 MG capsule Take 1 capsule (40 mg total) by mouth daily as needed (for heartburn or indigestion).    Fluticasone-Salmeterol (ADVAIR DISKUS) 250-50  MCG/DOSE AEPB Inhale 1 puff into the lungs 2 (two) times daily. Rinse mouth after each use    gabapentin (NEURONTIN) 400 MG capsule Take 1 capsule (400 mg total) by mouth 2 (two) times daily.    hydrALAZINE (APRESOLINE) 50 MG tablet Take 0.5 tablets (25 mg total) by mouth 3 (three) times daily.    metoprolol succinate (TOPROL XL) 100 MG 24 hr tablet Take 1 tablet (100 mg total) by mouth daily. Take with or immediately following a meal.    oxyCODONE-acetaminophen (PERCOCET/ROXICET) 5-325 MG tablet Take 1 tablet by mouth daily as needed for severe pain. 06/24/2019: Takes every 6 hours round the clock   potassium chloride (KLOR-CON) 10 MEQ tablet TAKE 2 TABLETS(20 MEQ) BY MOUTH TWICE DAILY    predniSONE (DELTASONE) 10 MG tablet Take 2 tablets (20 mg total) by mouth daily.    rivaroxaban (XARELTO) 20 MG TABS tablet Take 1 tablet (20 mg total) by mouth every morning.    SPIRIVA HANDIHALER 18 MCG inhalation capsule PLACE 1 CAPSULE INTO INHALER AND INHALE DAILY    torsemide (DEMADEX) 20 MG tablet TAKE 2 TABLETS BY MOUTH DAILY    No facility-administered encounter medications on file as of 07/01/2019.     Objective:   Goals Addressed            This Visit's Progress     Patient Stated    "I try to do what my doctors tell me to do" (pt-stated)       CARE PLAN ENTRY (see longitudinal plan of  care for additional care plan information)  Current Barriers:   Chronic Disease Management support, education, and care coordination needs related to Atrial Fibrillation, CHF, HTN, and COPD-returned call to patient - spoke with patient, she said she found her benazepril and was appreciative of this RNCM getting refills for her on the other medications she requested (gabapentin and metoprolol) . She also says she wants to consult with Dr Trilby Drummer because the emergency room MD gave her a prescription for prednisone on 06/29/19 and she wants to know if that is OK as she will be taking using steroid gtts for  her eye in the next week few weeks due to laser eye surgery for cataracts  Clinical Goal(s) related to Atrial Fibrillation, CHF, HTN, and COPD:  Over the next 30 days, patient will:   Work with the care management team to address educational, disease management, and care coordination needs   Begin or continue self health monitoring activities as directed today Measure and record blood pressure 5-7 times per week and Measure and record weight daily  Call provider office for new or worsened signs and symptoms Blood pressure findings outside established parameters, Weight outside established parameters, Shortness of breath, and New or worsened symptom related to CHF and COPD  Call care management team with questions or concerns  Verbalize basic understanding of patient centered plan of care established today  Interventions related to Atrial Fibrillation, CHF, HTN, and COPD:   Provider is requesting to see patient to discuss PO/OT eval and DME needs so called patient to request she schedule clinic appointment  Patient Self Care Activities related to Atrial Fibrillation, CHF, HTN, and COPD:   Patient is unable to independently self-manage chronic health conditions  Please see past updates related to this goal by clicking on the "Past Updates" button in the selected goal          Plan:   The care management team will reach out to the patient again over the next 30 days.    Kelli Churn RN, CCM, [facility] RN Care Manager [phone]

## 2019-07-01 NOTE — Progress Notes (Signed)
Internal Medicine Clinic Attending  CCM services provided by the care management provider and their documentation were discussed with Dr. Melvin. We reviewed the pertinent findings, urgent action items addressed by the resident and non-urgent items to be addressed by the PCP.  I agree with the assessment, diagnosis, and plan of care documented in the CCM and resident's note.  Tyneisha Hegeman, MD 07/01/2019  

## 2019-07-02 NOTE — Progress Notes (Signed)
Internal Medicine Clinic Attending  CCM services provided by the care management provider and their documentation were discussed with Dr. Harbrecht. We reviewed the pertinent findings, urgent action items addressed by the resident and non-urgent items to be addressed by the PCP.  I agree with the assessment, diagnosis, and plan of care documented in the CCM and resident's note.  Renn Stille, MD 07/02/2019 

## 2019-07-03 DIAGNOSIS — Z79899 Other long term (current) drug therapy: Secondary | ICD-10-CM | POA: Diagnosis not present

## 2019-07-03 DIAGNOSIS — M170 Bilateral primary osteoarthritis of knee: Secondary | ICD-10-CM | POA: Diagnosis not present

## 2019-07-03 DIAGNOSIS — M7989 Other specified soft tissue disorders: Secondary | ICD-10-CM | POA: Diagnosis not present

## 2019-07-07 ENCOUNTER — Ambulatory Visit: Payer: Medicare Other

## 2019-07-07 ENCOUNTER — Other Ambulatory Visit: Payer: Self-pay | Admitting: Internal Medicine

## 2019-07-07 DIAGNOSIS — I10 Essential (primary) hypertension: Secondary | ICD-10-CM

## 2019-07-07 DIAGNOSIS — J439 Emphysema, unspecified: Secondary | ICD-10-CM

## 2019-07-07 NOTE — Chronic Care Management (AMB) (Signed)
Care Management   Follow Up Note   07/07/2019 Name: Sharon Vasquez [name]: [id number] DOB: 05/19/1950  Referred by: Sharon Seat, MD Reason for referral : Care Coordination (Medicaid application, DME)   Sharon COPPAGE is a 69 y.o. year old female who is a primary care patient of Sharon Seat, MD. The care management team was consulted for assistance with care management and care coordination needs.    Review of patient status, including review of consultants reports, relevant laboratory and other test results, and collaboration with appropriate care team members and the patient's provider was performed as part of comprehensive patient evaluation and provision of chronic care management services.    SDOH (Social Determinants of Health) assessments performed: No See Care Plan activities for detailed interventions related to [facility])     Advanced Directives: See Care Plan and [employer] application for related entries.   Goals Addressed            This Visit's Progress   . COMPLETED: " I told my doctor I need help in my home" (pt-stated)       [place] (see longitudinal plan of care for additional care plan information)   Current Barriers:  . Chronic Disease Management support, education, and care coordination needs related to Atrial Fibrillation, CHF, HTN, and COPD  Case Manager Clinical Goal(s):  [name] Over the next 30 days, patient will work with BSW to address needs related to Inability to perform ADL's independently and Inability to perform IADL's independently in patient with Atrial Fibrillation, CHF, HTN, and COPD  Interventions:  . Called patient today regarding status of Medicaid application.  Patient applied for Medicaid due to Medicare not covering cost of [place]. Patient was informed by caseworker at [place] that she is over income limit to qualify for Medicaid.  Unfortunately, private pay is only option for in-home aide services without  Medicaid coverage.    Patient Self Care Activities:  . Self administers medications as prescribed . Attends all scheduled provider appointments . Calls pharmacy for medication refills . Performs ADL's independently . Performs IADL's independently . Calls provider office for new concerns or questions  Please see past updates related to this goal by clicking on the "Past Updates" button in the selected goal      . "I need a scooter to help me get around"       Current Barriers:  [name] Knowledge Barriers related to resources and support available to address needs related to Inability to perform ADL's independently; need for DME.  Case Manager Clinical Goal(s):  [name] Over the next 30 days, patient will work with BSW to address needs related to Inability to perform ADL's independently, need for DME.   . Over the next 30 days, BSW will collaborate with RN Care Manager to address care management and care coordination needs  Interventions:  . Reminded patient that appointment needs to be scheduled with provider to discuss PT/OT evaluation and DME needs.  Appointment scheduled for 08/21/19.     Patient Self Care Activities:  . Attends all scheduled provider appointments . Calls provider office for new concerns or questions  Please see past updates related to this goal by clicking on the "Past Updates" button in the selected goal      . COMPLETED: "I need help applying for Medicaid" (pt-stated)       Current Barriers:  [name] Knowledge Barriers of resources and support to address financial constraints related to cost of personal  care services, ADL IADL limitations, and limited access to caregiver  Case Manager Clinical Goal(s):  [name] Over the next 90 days, patient will work with BSW to address financial constraints related to cost of personal care services not covered by current insurance, applying for Medicaid  . Over the next 90 days, BSW will collaborate with RN Care Manager to address care management  and care coordination needs  Interventions:  . Called patient regarding status of Medicaid application.  Patient reports that she took application to [place].  She was contacted by caseworker and informed that she is over the income limit to qualify.     Patient Self Care Activities:  . Attends all scheduled provider appointments . Calls provider office for new concerns or questions  Please see past updates related to this goal by clicking on the "Past Updates" button in the selected goal          The patient has been provided with contact information for the care management team and has been advised to call with any health related questions or concerns.       Sharon Vasquez, [place] Coordination Social Worker [place] [phone]

## 2019-07-07 NOTE — Progress Notes (Signed)
Internal Medicine Clinic Attending  CCM services provided by the care management provider and their documentation were discussed with Dr. [place]field. We reviewed the pertinent findings, urgent action items addressed by the resident and non-urgent items to be addressed by the PCP.  I agree with the assessment, diagnosis, and plan of care documented in the CCM and resident's note.  Moriyah Byington, MD 07/07/2019  

## 2019-07-07 NOTE — Progress Notes (Signed)
Internal Medicine Clinic Resident   I have personally reviewed this encounter including the documentation in this note and/or discussed this patient with the care management provider. I will address any urgent items identified by the care management provider and will communicate my actions to the patient's PCP. I have reviewed the patient's CCM visit with my supervising attending.  Latonia Conrow D Pauline Pegues, DO 07/07/2019    

## 2019-07-07 NOTE — Patient Instructions (Signed)
Visit Information  Goals Addressed            This Visit's Progress   . COMPLETED: " I told my doctor I need help in my home" (pt-stated)       [place] (see longitudinal plan of care for additional care plan information)   Current Barriers:  . Chronic Disease Management support, education, and care coordination needs related to Atrial Fibrillation, CHF, HTN, and COPD  Case Manager Clinical Goal(s):  [name] Over the next 30 days, patient will work with BSW to address needs related to Inability to perform ADL's independently and Inability to perform IADL's independently in patient with Atrial Fibrillation, CHF, HTN, and COPD  Interventions:  . Called patient today regarding status of Medicaid application.  Patient applied for Medicaid due to Medicare not covering cost of [place]. Patient was informed by caseworker at [place] that she is over income limit to qualify for Medicaid.  Unfortunately, private pay is only option for in-home aide services without Medicaid coverage.    Patient Self Care Activities:  . Self administers medications as prescribed . Attends all scheduled provider appointments . Calls pharmacy for medication refills . Performs ADL's independently . Performs IADL's independently . Calls provider office for new concerns or questions  Please see past updates related to this goal by clicking on the "Past Updates" button in the selected goal      . "I need a scooter to help me get around"       Current Barriers:  [name] Knowledge Barriers related to resources and support available to address needs related to Inability to perform ADL's independently; need for DME.  Case Manager Clinical Goal(s):  [name] Over the next 30 days, patient will work with BSW to address needs related to Inability to perform ADL's independently, need for DME.   . Over the next 30 days, BSW will collaborate with RN Care Manager to address care management and care  coordination needs  Interventions:  . Reminded patient that appointment needs to be scheduled with provider to discuss PT/OT evaluation and DME needs.  Appointment scheduled for 08/21/19.     Patient Self Care Activities:  . Attends all scheduled provider appointments . Calls provider office for new concerns or questions  Please see past updates related to this goal by clicking on the "Past Updates" button in the selected goal      . COMPLETED: "I need help applying for Medicaid" (pt-stated)       Current Barriers:  [name] Knowledge Barriers of resources and support to address financial constraints related to cost of personal care services, ADL IADL limitations, and limited access to caregiver  Case Manager Clinical Goal(s):  [name] Over the next 90 days, patient will work with BSW to address financial constraints related to cost of personal care services not covered by current insurance, applying for Medicaid  . Over the next 90 days, BSW will collaborate with RN Care Manager to address care management and care coordination needs  Interventions:  . Called patient regarding status of Medicaid application.  Patient reports that she took application to [place].  She was contacted by caseworker and informed that she is over the income limit to qualify.     Patient Self Care Activities:  . Attends all scheduled provider appointments . Calls provider office for new concerns or questions  Please see past updates related to this goal by clicking on the "Past Updates"  button in the selected goal         Patient verbalizes understanding of instructions provided today.   The patient has been provided with contact information for the care management team and has been advised to call with any health related questions or concerns.    Ronn Melena, [place] Coordination Social Worker [place] [phone]

## 2019-07-15 ENCOUNTER — Ambulatory Visit: Payer: Medicare Other | Admitting: *Deleted

## 2019-07-15 DIAGNOSIS — J439 Emphysema, unspecified: Secondary | ICD-10-CM

## 2019-07-15 DIAGNOSIS — I5032 Chronic diastolic (congestive) heart failure: Secondary | ICD-10-CM

## 2019-07-15 NOTE — Chronic Care Management (AMB) (Signed)
Chronic Care Management   Follow Up Note   07/15/2019 Name: Sharon Vasquez [name]: [id number] DOB: [birth year]  Referred by: Neva Seat, MD Reason for referral : Chronic Care Management (COPD, HF)   Sharon Vasquez is a 69 y.o. year old female who is a primary care patient of Neva Seat, MD. The CCM team was consulted for assistance with chronic disease management and care coordination needs.    Review of patient status, including review of consultants reports, relevant laboratory and other test results, and collaboration with appropriate care team members and the patient's provider was performed as part of comprehensive patient evaluation and provision of chronic care management services.    SDOH (Social Determinants of Health) assessments performed: No See Care Plan activities for detailed interventions related to [facility])     Outpatient Encounter Medications as of 07/15/2019  Medication Sig Note  . albuterol (PROAIR HFA) 108 (90 Base) MCG/ACT inhaler Inhale 2 puffs into the lungs every 6 (six) hours as needed for wheezing or shortness of breath.   [name] albuterol (PROVENTIL) (2.5 MG/3ML) 0.083% nebulizer solution TAKE 3 MLS(1 AMPULE) BY NEBULIZATION EVERY 4 HOURS AS NEEDED FOR [facility] OR SHORTNESS OF BREATH 06/24/2019: Use once every two weeks  . albuterol (VENTOLIN HFA) 108 (90 Base) MCG/ACT inhaler Inhale 2 puffs into the lungs every 4 (four) hours as needed for wheezing or shortness of breath.   [name] amiodarone (PACERONE) 200 MG tablet Take 0.5 tablets (100 mg total) by mouth daily.   [name] aspirin EC 81 MG tablet Take 81 mg by mouth daily.   [name] atorvastatin (LIPITOR) 40 MG tablet Take 1 tablet (40 mg total) by mouth daily.   . benazepril (LOTENSIN) 40 MG tablet Take 1 tablet (40 mg total) by mouth daily.   [name] esomeprazole (NEXIUM) 40 MG capsule Take 1 capsule (40 mg total) by mouth daily as needed (for heartburn or indigestion).   . Fluticasone-Salmeterol (ADVAIR DISKUS) 250-50 MCG/DOSE  AEPB Inhale 1 puff into the lungs 2 (two) times daily. Rinse mouth after each use   . gabapentin (NEURONTIN) 400 MG capsule Take 1 capsule (400 mg total) by mouth 2 (two) times daily.   . hydrALAZINE (APRESOLINE) 50 MG tablet Take 0.5 tablets (25 mg total) by mouth 3 (three) times daily.   . metoprolol succinate (TOPROL XL) 100 MG 24 hr tablet Take 1 tablet (100 mg total) by mouth daily. Take with or immediately following a meal.   . oxyCODONE-acetaminophen (PERCOCET/ROXICET) 5-325 MG tablet Take 1 tablet by mouth daily as needed for severe pain. 06/24/2019: Takes every 6 hours round the clock  . potassium chloride (KLOR-CON) 10 MEQ tablet TAKE 2 TABLETS(20 MEQ) BY MOUTH TWICE DAILY   . predniSONE (DELTASONE) 10 MG tablet Take 2 tablets (20 mg total) by mouth daily.   . rivaroxaban (XARELTO) 20 MG TABS tablet Take 1 tablet (20 mg total) by mouth every morning.   [name] SPIRIVA HANDIHALER 18 MCG inhalation capsule PLACE 1 CAPSULE INTO INHALER AND INHALE DAILY   . torsemide (DEMADEX) 20 MG tablet TAKE 2 TABLETS BY MOUTH DAILY    No facility-administered encounter medications on file as of 07/15/2019.     Objective:   Goals Addressed            This Visit's Progress     Patient Stated   . "I try to do what my doctors tell me to do" (pt-stated)       [place] (see longitudinal plan of care  for additional care plan information)  Current Barriers:  . Chronic Disease Management support, education, and care coordination needs related to Atrial Fibrillation, CHF, HTN, and COPD-patient states she is doing well , says her home monitored blood pressure readings are back to normal and she is taking her 3 blood pressure medicines as prescribed, she denies increased dyspnea and cough from her COPD baseline, says she still has not been able to get scales for home use. She says she continues to see her pain provider Lucky Cowboy NP monthly and she has suggested a new treatment for her chronic pain  and she hopes that her health insurance will cover it. She says she made an appointment with Dr . Trilby Drummer so she can discuss the need for OT/PT evaluation to address her DME needs.   Clinical Goal(s) related to Atrial Fibrillation, CHF, HTN, and COPD:  Over the next 30 days, patient will:  . Work with the care management team to address educational, disease management, and care coordination needs  . Begin or continue self health monitoring activities as directed today Measure and record blood pressure 5-7 times per week and Measure and record weight daily . Call provider office for new or worsened signs and symptoms Blood pressure findings outside established parameters, Weight outside established parameters, Shortness of breath, and New or worsened symptom related to CHF and COPD . Call care management team with questions or concerns . Verbalize basic understanding of patient centered plan of care established today  Interventions related to Atrial Fibrillation, CHF, HTN, and COPD:  . Provider is requesting to see patient to discuss PO/OT evaluation and DME- patient made appointment for 08/21/19  Patient Self Care Activities related to Atrial Fibrillation, CHF, HTN, and COPD:  . Patient is unable to independently self-manage chronic health conditions  Please see past updates related to this goal by clicking on the "Past Updates" button in the selected goal          Plan:   The care management team will reach out to the patient again over the next 30-45 days days.    Kelli Churn RN, CCM, [facility] RN Care Manager [phone]

## 2019-07-15 NOTE — Patient Instructions (Signed)
Visit Information It was nice speaking with you today. If you cannot secure scales by your 08/21/19 appointment with Dr. Trilby Drummer, I will try to secure some for you.   Goals Addressed            This Visit's Progress     Patient Stated   . "I try to do what my doctors tell me to do" (pt-stated)       [place] (see longitudinal plan of care for additional care plan information)  Current Barriers:  . Chronic Disease Management support, education, and care coordination needs related to Atrial Fibrillation, CHF, HTN, and COPD-patient states she is doing well , says her blood pressure readings are back to normal and she is taking her 3 blood pressure medicines as prescribed, she denies increased dyspnea and cough from baseline of her COPD, says she still has not been able to get scales for home use. She says she continues to see her pain provider Lucky Cowboy NP monthly and she has suggested a new treatment for her chronic pain and she hopes that her health insurance will cover it. She says she made an appointment with Dr . Trilby Drummer so she can discuss the need for OT/PT evaluation to address her DME needs.   Clinical Goal(s) related to Atrial Fibrillation, CHF, HTN, and COPD:  Over the next 30 days, patient will:  . Work with the care management team to address educational, disease management, and care coordination needs  . Begin or continue self health monitoring activities as directed today Measure and record blood pressure 5-7 times per week and Measure and record weight daily . Call provider office for new or worsened signs and symptoms Blood pressure findings outside established parameters, Weight outside established parameters, Shortness of breath, and New or worsened symptom related to CHF and COPD . Call care management team with questions or concerns . Verbalize basic understanding of patient centered plan of care established today  Interventions related to Atrial Fibrillation,  CHF, HTN, and COPD:  . Provider is requesting to see patient to discuss PO/OT evaluation and DME needs so called patient to request she schedule clinic appointment- patient made appointment for 08/21/19  Patient Self Care Activities related to Atrial Fibrillation, CHF, HTN, and COPD:  . Patient is unable to independently self-manage chronic health conditions  Please see past updates related to this goal by clicking on the "Past Updates" button in the selected goal         The patient verbalized understanding of instructions provided today and declined a print copy of patient instruction materials.   The care management team will reach out to the patient again over the next 30-45 days.   Kelli Churn RN, CCM, [facility] RN Care Manager [phone]

## 2019-07-16 NOTE — Progress Notes (Signed)
Internal Medicine Clinic Resident  I have personally reviewed this encounter including the documentation in this note and/or discussed this patient with the care management provider. I will address any urgent items identified by the care management provider and will communicate my actions to the patient's PCP. I have reviewed the patient's CCM visit with my supervising attending.  Ladona Horns, MD 07/16/2019

## 2019-07-16 NOTE — Progress Notes (Signed)
Internal Medicine Clinic Attending  CCM services provided by the care management provider and their documentation were reviewed with Dr. Ronnald Ramp.  We reviewed the pertinent findings, urgent action items addressed by the resident and non-urgent items to be addressed by the PCP.  I agree with the assessment, diagnosis, and plan of care documented in the CCM and resident's note.  Oda Kilts, MD 07/16/2019

## 2019-07-17 ENCOUNTER — Other Ambulatory Visit: Payer: Self-pay | Admitting: Orthopedic Surgery

## 2019-07-17 DIAGNOSIS — M25569 Pain in unspecified knee: Secondary | ICD-10-CM

## 2019-07-18 ENCOUNTER — Encounter: Payer: Self-pay | Admitting: *Deleted

## 2019-07-18 ENCOUNTER — Other Ambulatory Visit (HOSPITAL_COMMUNITY): Payer: Self-pay | Admitting: Interventional Radiology

## 2019-07-18 ENCOUNTER — Ambulatory Visit
Admission: RE | Admit: 2019-07-18 | Discharge: 2019-07-18 | Disposition: A | Discharge status: Home / Self Care | Payer: Medicare Other | Source: Ambulatory Visit | Attending: Orthopedic Surgery | Admitting: Orthopedic Surgery

## 2019-07-18 DIAGNOSIS — M25562 Pain in left knee: Secondary | ICD-10-CM

## 2019-07-18 DIAGNOSIS — M25561 Pain in right knee: Secondary | ICD-10-CM | POA: Diagnosis not present

## 2019-07-18 DIAGNOSIS — M25569 Pain in unspecified knee: Secondary | ICD-10-CM

## 2019-07-18 HISTORY — PX: IR RADIOLOGIST EVAL & MGMT: IMG5224

## 2019-07-18 NOTE — Consult Note (Signed)
Chief Complaint: Knee pain  Referring Physician(s): Graves,Reyn Faivre  History of Present Illness: Sharon Vasquez is a 69 y.o. female with past medical history significant for atrial fibrillation, DVT and pulmonary embolism (on Xarelto), hypertension, hyperlipidemia, CHF, peripheral arterial disease (post arterial stenting of the left lower extremity) and morbid obesity who has been referred to the interventional radiology clinic for potential IOVERA treatment of the pain.  Patient has long history of severe bilateral knee pain which she attributes in part due to her morbid obesity.  She is able to ambulate short distances with a walker though is otherwise primarily wheelchair-bound.  While she denies significant knee pain at rest she reports her pain worsens to 9-10 out of 10 with standing or activity.  She has been evaluated by Dr. Berenice Primas and has been deemed a poor operative candidate given her multiple medical comorbidities.    Past Medical History:  Diagnosis Date  . A-fib ([place])   . Anxiety   . Arthritis    "qwhre; joints, back" (04/17/2017)  . Benign breast cyst in female, left 01/08/2017   Found by Screening mammogram, evaluated by [facility] on 01/08/17 and determined to be a benign simple breast cyst.  . Cellulitis of left lower leg 05/30/2017  . CHF (congestive heart failure) ([place])   . Chronic low back pain 08/21/2016  . Chronic lower back pain   . Chronic venous insufficiency    [name] 05/30/2017  . COPD (chronic obstructive pulmonary disease) ([place])   . Depression   . DVT (deep venous thrombosis) ([place]) 11/16/2016  . GERD (gastroesophageal reflux disease)   . Headache    "weekly for the last 3 months" (04/17/2017)  . Hyperlipidemia   . Hypertension   . Morbid obesity ([place])   . PE (pulmonary embolism)   . Pulmonary embolism ([place]) 09/21/2014    Past Surgical History:  Procedure Laterality Date  . ABDOMINAL HYSTERECTOMY    . APPENDECTOMY    . BREAST CYST EXCISION Left    "six o'clock"  . BREAST LUMPECTOMY Left   . CHOLECYSTECTOMY    . DILATION AND CURETTAGE OF UTERUS    . TONSILLECTOMY AND ADENOIDECTOMY    . TUBAL LIGATION      Allergies: Patient has no known allergies.  Medications: Prior to Admission medications   Medication Sig Start Date End Date Taking? Authorizing Provider  albuterol (PROAIR HFA) 108 (90 Base) MCG/ACT inhaler Inhale 2 puffs into the lungs every 6 (six) hours as needed for wheezing or shortness of breath. 08/29/18   Neva Seat, MD  albuterol (PROVENTIL) (2.5 MG/3ML) 0.083% nebulizer solution TAKE 3 MLS(1 AMPULE) BY NEBULIZATION EVERY 4 HOURS AS NEEDED FOR [facility] OR SHORTNESS OF BREATH 01/20/19   Neva Seat, MD  albuterol (VENTOLIN HFA) 108 (90 Base) MCG/ACT inhaler Inhale 2 puffs into the lungs every 4 (four) hours as needed for wheezing or shortness of breath. 06/30/19   Ward, Delice Bison, DO  amiodarone (PACERONE) 200 MG tablet Take 0.5 tablets (100 mg total) by mouth daily. 08/29/18   Neva Seat, MD  aspirin EC 81 MG tablet Take 81 mg by mouth daily.    [provider]  atorvastatin (LIPITOR) 40 MG tablet Take 1 tablet (40 mg total) by mouth daily. 11/15/18   Neva Seat, MD  benazepril (LOTENSIN) 40 MG tablet Take 1 tablet (40 mg total) by mouth daily. 06/05/19 06/04/20  Neva Seat, MD  esomeprazole (NEXIUM) 40 MG capsule Take 1 capsule (40 mg total) by mouth daily as  needed (for heartburn or indigestion). 01/20/19   Neva Seat, MD  Fluticasone-Salmeterol (ADVAIR DISKUS) 250-50 MCG/DOSE AEPB Inhale 1 puff into the lungs 2 (two) times daily. Rinse mouth after each use 01/20/19   Neva Seat, MD  gabapentin (NEURONTIN) 400 MG capsule Take 1 capsule (400 mg total) by mouth 2 (two) times daily. 06/30/19   Neva Seat, MD  hydrALAZINE (APRESOLINE) 50 MG tablet Take 0.5 tablets (25 mg total) by mouth 3 (three) times daily. 06/05/19   Neva Seat, MD  metoprolol succinate (TOPROL XL)  100 MG 24 hr tablet Take 1 tablet (100 mg total) by mouth daily. Take with or immediately following a meal. 06/30/19   Neva Seat, MD  oxyCODONE-acetaminophen (PERCOCET/ROXICET) 5-325 MG tablet Take 1 tablet by mouth daily as needed for severe pain. 09/26/18   Neva Seat, MD  potassium chloride (KLOR-CON) 10 MEQ tablet TAKE 2 TABLETS(20 MEQ) BY MOUTH TWICE DAILY 04/21/19   Lorretta Harp, MD  predniSONE (DELTASONE) 10 MG tablet Take 2 tablets (20 mg total) by mouth daily. 06/30/19   Ward, Delice Bison, DO  rivaroxaban (XARELTO) 20 MG TABS tablet Take 1 tablet (20 mg total) by mouth every morning. 09/26/18   Neva Seat, MD  [facility] HANDIHALER 18 MCG inhalation capsule PLACE 1 CAPSULE INTO INHALER AND INHALE DAILY 01/10/19   Martyn Ehrich, NP  torsemide (DEMADEX) 20 MG tablet TAKE 2 TABLETS BY MOUTH DAILY 03/21/19   Lorretta Harp, MD     Family History  Problem Relation Age of Onset  . Breast cancer Mother   . Hypertension Mother   . Hyperlipidemia Mother   . Hypertension Maternal Grandfather   . Hyperlipidemia Maternal Grandfather     Social History   Socioeconomic History  . Marital status: Divorced    Spouse name: Not on file  . Number of children: Not on file  . Years of education: Not on file  . Highest education level: Not on file  Occupational History  . Not on file  Tobacco Use  . Smoking status: Current Some Day Smoker    Packs/day: 0.10    Years: 55.00    Pack years: 5.50    Types: Cigarettes    Start date: 08/16/1961  . Smokeless tobacco: Never Used  . Tobacco comment: 25 cigs per day  Substance and Sexual Activity  . Alcohol use: No  . Drug use: No  . Sexual activity: Not Currently    Partners: Male  Other Topics Concern  . Not on file  Social History Narrative   Lives in [facility] in [place]. Lives alone, but is dependent in ADLs/IADLs. 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 Two  daughters live nearby. Previously resided in [place].   Social Determinants of Health   Financial Resource Strain: Medium Risk  . Difficulty of Paying Living Expenses: Somewhat hard  Food Insecurity: No Food Insecurity  . Worried About Charity fundraiser in the Last Year: Never true  . Ran Out of Food in the Last Year: Never true  Transportation Needs: No Transportation Needs  . Lack of Transportation (Medical): No  . Lack of Transportation (Non-Medical): No  Physical Activity:   . Days of Exercise per Week:   . Minutes of Exercise per Session:   Stress:   . Feeling of Stress :   Social Connections:   . Frequency of Communication with Friends and Family:   . Frequency of Social  Gatherings with Friends and Family:   . Attends Religious Services:   . Active Member of Clubs or Organizations:   . Attends Archivist Meetings:   [name] Marital Status:     ECOG Status: 3 - Symptomatic, >50% confined to bed  Review of Systems  Review of Systems: A 12 point ROS discussed and pertinent positives are indicated in the HPI above.  All other systems are negative.  Physical Exam No direct physical exam was performed (except for noted visual exam findings with Video Visits).   Vital Signs: There were no vitals taken for this visit.  Imaging: CT Head Wo Contrast  Result Date: 06/30/2019 CLINICAL DATA:  Headaches and dizziness EXAM: CT HEAD WITHOUT CONTRAST TECHNIQUE: Contiguous axial images were obtained from the base of the skull through the vertex without intravenous contrast. COMPARISON:  None. FINDINGS: Brain: No evidence of acute infarction, hemorrhage, hydrocephalus, extra-axial collection or mass lesion/mass effect. Vascular: No hyperdense vessel or unexpected calcification. Skull: Normal. Negative for fracture or focal lesion. Sinuses/Orbits: No acute finding. Other: None. IMPRESSION: Normal head CT Electronically Signed   By: Inez Catalina M.D.   On: 06/30/2019 03:13   DG Chest  Portable 1 View  Result Date: 06/30/2019 CLINICAL DATA:  Shortness of breath EXAM: PORTABLE CHEST 1 VIEW COMPARISON:  12/31/2017 CT FINDINGS: Cardiac shadow is mildly enlarged but stable. Aortic calcifications are again seen. Patchy interstitial changes are noted similar to that seen on prior CT examination. No focal confluent infiltrate is noted. IMPRESSION: Chronic interstitial changes stable from the prior CT. Electronically Signed   By: Inez Catalina M.D.   On: 06/30/2019 00:15   MM 3D SCREEN BREAST BILATERAL  Result Date: 07/01/2019 CLINICAL DATA:  Screening. EXAM: DIGITAL SCREENING BILATERAL MAMMOGRAM WITH TOMO AND CAD COMPARISON:  Previous exam(s). ACR Breast Density Category b: There are scattered areas of fibroglandular density. FINDINGS: There are no findings suspicious for malignancy. Images were processed with CAD. IMPRESSION: No mammographic evidence of malignancy. A result letter of this screening mammogram will be mailed directly to the patient. RECOMMENDATION: Screening mammogram in one year. (Code:[id number]-01Y) BI-RADS CATEGORY  1: Negative. Electronically Signed   By: Abelardo Diesel M.D.   On: 07/01/2019 13:35    Labs:  CBC: Recent Labs    06/30/19 0105  WBC 5.9  HGB 13.5  HCT 41.1  PLT 296    COAGS: No results for input(s): INR, APTT in the last 8760 hours.  BMP: Recent Labs    06/30/19 0130  NA 145  K 4.1  CL 104  CO2 28  GLUCOSE 111*  BUN 13  CALCIUM 9.9  CREATININE 0.97  GFRNONAA 60*  GFRAA >60    LIVER FUNCTION TESTS: No results for input(s): BILITOT, AST, ALT, ALKPHOS, PROT, ALBUMIN in the last 8760 hours.  TUMOR MARKERS: No results for input(s): AFPTM, CEA, CA199, CHROMGRNA in the last 8760 hours.   Assessment and Plan:  Sharon Vasquez is a 69 y.o. female with past medical history significant for atrial fibrillation, DVT and pulmonary embolism (on Xarelto), hypertension, hyperlipidemia, CHF, peripheral arterial disease (post arterial stenting of the  left lower extremity) and morbid obesity who has been referred to the interventional radiology clinic for potential IOVERA treatment of the pain.  She has been evaluated by Dr. Berenice Primas and has been deemed a poor operative candidate given her multiple medical comorbidities.  I explained that the [facility] treatment is a new modality that utilizes cryoablation technology to temporarily alter the pain  receptor nerves which supply the anterior aspect of the knee, in hopes of achieving a clinically significant reduction in knee pain.    I explained that if successful, the patient will experience a rapid pain reduction which can last for approximately 3 months.  I also explained that while the pain will (hopefully) be reduced, the structural/functional damage of the knee remains, and thus, this is not a curative technology and their knee pain will return.    Risks associated with the procedure include bleeding/bruising, infection and post procedural paresthesias/weakness.  I explained that the procedure is performed as an outpatient basis at [facility].  The procedure requires local anesthesia and does NOT require conscious sedation, and therefore the patient does need to be NPO and there is no postprocedural recovery.  The patient was encouraged to wear either shorts or loose fitting pants that can be rolled up to the upper thigh.  Patient's also do NOT need to hold anticoagulation for the procedure.  Following this prolonged and detailed conversation, the patient wishes to pursue IOVERA therapy.  As such, pending insurance approval, this procedure would be scheduled at [facility] hospital at the next earliest convenience.  The patient knows to call the interventional radiology clinic with any future questions or concerns.   Thank you for this interesting consult.  I greatly enjoyed meeting Sharon Vasquez and look forward to participating in their care.  A copy of this report was sent to the  requesting provider on this date.  Electronically Signed: Sandi Mariscal 07/18/2019, 8:16 AM   I spent a total of 15 Minutes in remote  clinical consultation, greater than 50% of which was counseling/coordinating care for knee pain.    Visit type: Audio only (telephone). Audio (no video) only due to patient's lack of internet/smartphone capability. Alternative for in-person consultation at [facility], [address] [place], [place], [place]. This visit type was conducted due to national recommendations for restrictions regarding the COVID-19 Pandemic (e.g. social distancing).  This format is felt to be most appropriate for this patient at this time.  All issues noted in this document were discussed and addressed.

## 2019-07-23 ENCOUNTER — Other Ambulatory Visit: Payer: Self-pay

## 2019-07-23 ENCOUNTER — Ambulatory Visit (HOSPITAL_COMMUNITY)
Admission: RE | Admit: 2019-07-23 | Discharge: 2019-07-23 | Disposition: A | Discharge status: Home / Self Care | Payer: Medicare Other | Source: Ambulatory Visit | Attending: Interventional Radiology | Admitting: Interventional Radiology

## 2019-07-23 DIAGNOSIS — Z7982 Long term (current) use of aspirin: Secondary | ICD-10-CM | POA: Insufficient documentation

## 2019-07-23 DIAGNOSIS — I739 Peripheral vascular disease, unspecified: Secondary | ICD-10-CM | POA: Insufficient documentation

## 2019-07-23 DIAGNOSIS — I110 Hypertensive heart disease with heart failure: Secondary | ICD-10-CM | POA: Insufficient documentation

## 2019-07-23 DIAGNOSIS — E785 Hyperlipidemia, unspecified: Secondary | ICD-10-CM | POA: Diagnosis not present

## 2019-07-23 DIAGNOSIS — G8929 Other chronic pain: Secondary | ICD-10-CM | POA: Insufficient documentation

## 2019-07-23 DIAGNOSIS — Z7901 Long term (current) use of anticoagulants: Secondary | ICD-10-CM | POA: Insufficient documentation

## 2019-07-23 DIAGNOSIS — Z79899 Other long term (current) drug therapy: Secondary | ICD-10-CM | POA: Diagnosis not present

## 2019-07-23 DIAGNOSIS — F17210 Nicotine dependence, cigarettes, uncomplicated: Secondary | ICD-10-CM | POA: Insufficient documentation

## 2019-07-23 DIAGNOSIS — K219 Gastro-esophageal reflux disease without esophagitis: Secondary | ICD-10-CM | POA: Diagnosis not present

## 2019-07-23 DIAGNOSIS — I4891 Unspecified atrial fibrillation: Secondary | ICD-10-CM | POA: Diagnosis not present

## 2019-07-23 DIAGNOSIS — M25562 Pain in left knee: Secondary | ICD-10-CM | POA: Diagnosis not present

## 2019-07-23 DIAGNOSIS — I509 Heart failure, unspecified: Secondary | ICD-10-CM | POA: Insufficient documentation

## 2019-07-23 DIAGNOSIS — Z86718 Personal history of other venous thrombosis and embolism: Secondary | ICD-10-CM | POA: Insufficient documentation

## 2019-07-23 DIAGNOSIS — J449 Chronic obstructive pulmonary disease, unspecified: Secondary | ICD-10-CM | POA: Diagnosis not present

## 2019-07-23 DIAGNOSIS — Z86711 Personal history of pulmonary embolism: Secondary | ICD-10-CM | POA: Diagnosis not present

## 2019-07-23 DIAGNOSIS — F419 Anxiety disorder, unspecified: Secondary | ICD-10-CM | POA: Insufficient documentation

## 2019-07-23 HISTORY — PX: IR ABLATE LIVER CRYOABLATION: IMG5524

## 2019-07-23 MED ORDER — LIDOCAINE-EPINEPHRINE (PF) 2 %-1:200000 IJ SOLN
INTRAMUSCULAR | Status: AC
  Filled 2019-07-23: qty 20

## 2019-07-23 MED ORDER — LIDOCAINE-EPINEPHRINE (PF) 2 %-1:200000 IJ SOLN
INTRAMUSCULAR | Status: DC | PRN
  Administered 2019-07-23: 10 mL

## 2019-07-24 DIAGNOSIS — H2512 Age-related nuclear cataract, left eye: Secondary | ICD-10-CM | POA: Diagnosis not present

## 2019-07-24 DIAGNOSIS — H2513 Age-related nuclear cataract, bilateral: Secondary | ICD-10-CM | POA: Diagnosis not present

## 2019-07-25 DIAGNOSIS — H25011 Cortical age-related cataract, right eye: Secondary | ICD-10-CM | POA: Diagnosis not present

## 2019-07-25 DIAGNOSIS — H2511 Age-related nuclear cataract, right eye: Secondary | ICD-10-CM | POA: Diagnosis not present

## 2019-07-25 DIAGNOSIS — H25041 Posterior subcapsular polar age-related cataract, right eye: Secondary | ICD-10-CM | POA: Diagnosis not present

## 2019-07-28 ENCOUNTER — Ambulatory Visit (INDEPENDENT_AMBULATORY_CARE_PROVIDER_SITE_OTHER): Payer: Medicare Other | Admitting: Podiatrist

## 2019-07-28 ENCOUNTER — Encounter: Payer: Self-pay | Admitting: Podiatrist

## 2019-07-28 ENCOUNTER — Other Ambulatory Visit: Payer: Self-pay

## 2019-07-28 VITALS — HR 63 | Resp 14

## 2019-07-28 DIAGNOSIS — B351 Tinea unguium: Secondary | ICD-10-CM

## 2019-07-28 DIAGNOSIS — R609 Edema, unspecified: Secondary | ICD-10-CM

## 2019-07-28 DIAGNOSIS — I739 Peripheral vascular disease, unspecified: Secondary | ICD-10-CM | POA: Diagnosis not present

## 2019-07-28 NOTE — Patient Instructions (Signed)
GENERAL FOOT HEALTH INFORMATION: ° °Moisturize your feet regularly with a cream based lotion.  One's I recommend are Cetaphil (Cream) or Eucerin (Cream)- usually available in a tub or crock type of container.  Avoid applying the cream to the toe interspaces themselves to reduce the risk of a fungal infection between the toes.  After showering or bathing be sure to dry well between your toes.   ° ° °Watch your toenails for any signs of infection including drainage, pus redness or swelling along the sides of the toenails.  Soak in epsom salt water and use antibiotic ointment (OTC) if you notice this start to occur.  If the redness does not resolve within 2-3 days, call for an appointment to be seen. ° °

## 2019-07-28 NOTE — Progress Notes (Signed)
  Chief Complaint  Patient presents with  . Nail Problem    the toenails are over grown     HPI: Patient is 69 y.o. female who presents today for the concerns as listed above.  She relates she has swelling and is unable to reach her toes to trim them herself.  She also relates she recently had a procedure to deaden the nerves in her left leg as she has significant pain in her left leg. She presents in a wheelchair.    Review of Systems No fevers, chills, nausea, muscle aches, no difficulty breathing, no calf pain, no chest pain or shortness of breath.   Physical Exam  GENERAL APPEARANCE: Alert, conversant. Appropriately groomed. No acute distress.   VASCULAR: Pedal pulses palpable 2/4 DP and non palpable PT bilateral.  Capillary refill time is immediate to all digits,  Proximal to distal cooling it warm to warm.  Significant non pitting edema is present bilateral lower extremities.   NEUROLOGIC: sensation is intact epicritically and protectively to 5.07 monofilament at 2/5 sites bilateral.  Light touch is decreased bilateral, vibratory sensation intact bilateral. Subjective burning sensations noted.   MUSCULOSKELETAL: acceptable muscle strength, tone and stability bilateral.  Contracture of digits noted. No gross boney pedal deformities noted.  No pain, crepitus or limitation noted with foot and ankle range of motion bilateral.   DERMATOLOGIC: skin is warm, supple, and dry.  No open lesions noted. Digital nails are thick, brittle, discolored and have subungual debris present. Bilateral hallux nails are incurvated and growing in a dorsally curved orientation.  Pain with pressure and debridement is noted.   Assessment     ICD-10-CM   1. Onychomycosis  B35.1   2. Edema, unspecified type  R60.9      Plan  Debridement of toenails was recommended.  Onychoreduction of symptomatic toenails was performed via nail nipper and power burr without iatrogenic incident.  Patient was instructed on  signs and symptoms of infection and was told to call immediately should any of these arise.  She will return as needed or in 3 months for continued care.

## 2019-07-29 ENCOUNTER — Telehealth: Payer: Self-pay | Admitting: *Deleted

## 2019-07-29 NOTE — Telephone Encounter (Signed)
Follow up call to Sharon Vasquez from the [facility] on Left Knee. She states it feels numb and heavy, however most of the pain is gone. She does not want treatment on rt knee at this time.Cathren Harsh

## 2019-08-01 ENCOUNTER — Encounter: Payer: Self-pay | Admitting: *Deleted

## 2019-08-01 DIAGNOSIS — M25561 Pain in right knee: Secondary | ICD-10-CM | POA: Diagnosis not present

## 2019-08-01 DIAGNOSIS — Z79899 Other long term (current) drug therapy: Secondary | ICD-10-CM | POA: Diagnosis not present

## 2019-08-01 DIAGNOSIS — M25562 Pain in left knee: Secondary | ICD-10-CM | POA: Diagnosis not present

## 2019-08-01 DIAGNOSIS — M170 Bilateral primary osteoarthritis of knee: Secondary | ICD-10-CM | POA: Diagnosis not present

## 2019-08-01 DIAGNOSIS — G8929 Other chronic pain: Secondary | ICD-10-CM | POA: Diagnosis not present

## 2019-08-05 ENCOUNTER — Telehealth: Payer: Self-pay | Admitting: *Deleted

## 2019-08-05 NOTE — Telephone Encounter (Signed)
I called Sharon Vasquez (DOB verified) this morning to explain that the IFOBT kit she collected and mailed did not reach [place] in time for analysis. The specimen was >104 days old. Recollection is necessary. I shared my apologies for the inconvenience.  [name] has an upcoming appt with Dr Trilby Drummer 08/21/2019.  I offered to mail her a new IFOBT kit. Explained that she can complete the test kit the day of her appt or 1-2 days before her appt. Bring the completed IFOBT with her to the visit.   [name] agreed to the above plan.    I will mail the new IFOBT kit today.  Maryan Rued, PBT 08-05-2019  11:52

## 2019-08-07 DIAGNOSIS — H2511 Age-related nuclear cataract, right eye: Secondary | ICD-10-CM | POA: Diagnosis not present

## 2019-08-07 DIAGNOSIS — H2513 Age-related nuclear cataract, bilateral: Secondary | ICD-10-CM | POA: Diagnosis not present

## 2019-08-19 ENCOUNTER — Telehealth: Payer: Self-pay | Admitting: Interventional Radiology

## 2019-08-19 ENCOUNTER — Telehealth: Payer: Medicare Other

## 2019-08-19 ENCOUNTER — Ambulatory Visit: Payer: Self-pay | Admitting: *Deleted

## 2019-08-19 ENCOUNTER — Ambulatory Visit: Payer: Medicare Other | Admitting: *Deleted

## 2019-08-19 DIAGNOSIS — J439 Emphysema, unspecified: Secondary | ICD-10-CM

## 2019-08-19 DIAGNOSIS — I5032 Chronic diastolic (congestive) heart failure: Secondary | ICD-10-CM

## 2019-08-19 NOTE — Progress Notes (Signed)
Internal Medicine Clinic Resident  I have personally reviewed this encounter including the documentation in this note and/or discussed this patient with the care management provider. I will address any urgent items identified by the care management provider and will communicate my actions to the patient's PCP. I have reviewed the patient's CCM visit with my supervising attending, Dr Lynnae January.  Harvie Heck, MD  Internal Medicine, PGY-1 08/19/2019

## 2019-08-19 NOTE — Telephone Encounter (Signed)
Post left sided IOVERA treatment on 4/21.  Pre procedural pain score: 10/10 at rest 10/10 with activity  Post procedural pain score: 0/10 at rest 0-2/10 with activity  Patient states her pain returned about 48-72 hrs after the procedure and overall does NOT feel much improved since undergoing the procedure.  Patient continues to report numbness associated with the knee, primarily laterally, for which I explained is an expected outcome after the procedure and her sensation should gradually return in another 2-3 months as the ablated nerve regenerates.   Given above, we will NOT plan on pursuing IOVERA for the left knee.  Ronny Bacon, MD Pager #: [phone]

## 2019-08-19 NOTE — Chronic Care Management (AMB) (Signed)
  Chronic Care Management   Outreach Note  08/19/2019 Name: Sharon Vasquez [name]: [id number] DOB: 1950-09-17  Referred by: Neva Seat, MD Reason for referral : Chronic Care Management (HF, HTN, COPD, PAF, OSA)   An unsuccessful telephone outreach was attempted today. The patient was referred to the case management team for assistance with care management and care coordination.   Follow Up Plan: A HIPPA compliant phone message was left for the patient providing contact information and requesting a return call.  The care management team will reach out to the patient again over the next 7 days.   Kelli Churn RN, CCM, [facility] RN Care Manager [phone]

## 2019-08-19 NOTE — Patient Instructions (Signed)
Visit Information It was nice speaking with you today. I will ask someone from the [facility] support team to reach out to you as soon as possible to address your post procedure questions and concerns about your post procedure  Goals Addressed            This Visit's Progress     Patient Stated   . " I had this procedure on my knee and I want to talk to somebody about the problems I'm having since the procedure." (pt-stated)       Sharon Vasquez (see longitudinal plan of care for additional care plan information)  Current Barriers:  [name] Knowledge Deficits related to [facility] Treatment on Left Knee post procedure assistance for concerns and questions. - patient returned call to this CCM RN and is requesting assistance in contacting the appropriate resource to address her questions and concerns related to  post [facility] Rx to left knee done 07/23/19  Nurse Case Manager Clinical Goal(s):  [name] Over the next 7 days, patient will verbalize understanding of plan for CCM RN 's assistance with  connecting patient with resources to have questions and concerns addressed regarding [facility] treatment to left knee.   Interventions:  . Inter-disciplinary care team collaboration (see longitudinal plan of care) . Collaborated with [facility] support team Marcelyn Bruins and Dr Pascal Lux Interventionist regarding patient's questions and concerns and requested they outreach to patient as soon as possible  Patient Self Care Activities:  . Patient verbalizes understanding of plan for CCM RN to contact [facility] support team to request to contact patient to address her post procedure questions and concerns . Unable to independently contact [facility] support team to address her post procedure questions and concerns  Initial goal documentation        The patient verbalized understanding of instructions provided today and declined a print copy of patient instruction materials.   The care management team will reach out to the  patient again over the next 7 days.   Kelli Churn RN, CCM, [facility] RN Care Manager [phone]

## 2019-08-19 NOTE — Chronic Care Management (AMB) (Signed)
Chronic Care Management   Follow Up Note   08/19/2019 Name: Sharon Vasquez [name]: [id number] DOB: 09/30/1950  Referred by: Neva Seat, MD Reason for referral : Chronic Care Management (COPD, PAF,HF, HTN)   Sharon Vasquez is a 69 y.o. year old female who is a primary care patient of Neva Seat, MD. The CCM team was consulted for assistance with chronic disease management and care coordination needs.    Review of patient status, including review of consultants reports, relevant laboratory and other test results, and collaboration with appropriate care team members and the patient's provider was performed as part of comprehensive patient evaluation and provision of chronic care management services.    SDOH (Social Determinants of Health) assessments performed: No See Care Plan activities for detailed interventions related to [facility])     Outpatient Encounter Medications as of 08/19/2019  Medication Sig Note  . albuterol (PROAIR HFA) 108 (90 Base) MCG/ACT inhaler Inhale 2 puffs into the lungs every 6 (six) hours as needed for wheezing or shortness of breath.   [name] albuterol (PROVENTIL) (2.5 MG/3ML) 0.083% nebulizer solution TAKE 3 MLS(1 AMPULE) BY NEBULIZATION EVERY 4 HOURS AS NEEDED FOR [facility] OR SHORTNESS OF BREATH 06/24/2019: Use once every two weeks  . albuterol (VENTOLIN HFA) 108 (90 Base) MCG/ACT inhaler Inhale 2 puffs into the lungs every 4 (four) hours as needed for wheezing or shortness of breath.   [name] amiodarone (PACERONE) 200 MG tablet Take 0.5 tablets (100 mg total) by mouth daily.   [name] aspirin EC 81 MG tablet Take 81 mg by mouth daily.   [name] atorvastatin (LIPITOR) 40 MG tablet Take 1 tablet (40 mg total) by mouth daily.   . benazepril (LOTENSIN) 40 MG tablet Take 1 tablet (40 mg total) by mouth daily.   [name] esomeprazole (NEXIUM) 40 MG capsule Take 1 capsule (40 mg total) by mouth daily as needed (for heartburn or indigestion).   . Fluticasone-Salmeterol (ADVAIR DISKUS) 250-50  MCG/DOSE AEPB Inhale 1 puff into the lungs 2 (two) times daily. Rinse mouth after each use   . gabapentin (NEURONTIN) 400 MG capsule Take 1 capsule (400 mg total) by mouth 2 (two) times daily.   . hydrALAZINE (APRESOLINE) 50 MG tablet Take 0.5 tablets (25 mg total) by mouth 3 (three) times daily.   . metoprolol succinate (TOPROL XL) 100 MG 24 hr tablet Take 1 tablet (100 mg total) by mouth daily. Take with or immediately following a meal.   . oxyCODONE-acetaminophen (PERCOCET/ROXICET) 5-325 MG tablet Take 1 tablet by mouth daily as needed for severe pain. 06/24/2019: Takes every 6 hours round the clock  . potassium chloride (KLOR-CON) 10 MEQ tablet TAKE 2 TABLETS(20 MEQ) BY MOUTH TWICE DAILY   . predniSONE (DELTASONE) 10 MG tablet Take 2 tablets (20 mg total) by mouth daily.   . rivaroxaban (XARELTO) 20 MG TABS tablet Take 1 tablet (20 mg total) by mouth every morning.   [name] SPIRIVA HANDIHALER 18 MCG inhalation capsule PLACE 1 CAPSULE INTO INHALER AND INHALE DAILY   . torsemide (DEMADEX) 20 MG tablet TAKE 2 TABLETS BY MOUTH DAILY    No facility-administered encounter medications on file as of 08/19/2019.     Objective:   Goals Addressed            This Visit's Progress     Patient Stated   . " I had this procedure on my knee and I want to talk to somebody about the problems I'm having since the procedure." (pt-stated)  CARE PLAN ENTRY (see longitudinal plan of care for additional care plan information)  Current Barriers:  Knowledge Deficits related to [place] on Left Knee post procedure assistance for concerns and questions. -patient returned call to this CCM RN and is requesting assistance in contacting the appropriate resource to address her questions and concerns related to  post [facility]a Rx to left knee done 07/23/19   Nurse Case Manager Clinical Goal(s):  [name] Over the next 7 days, patient will verbalize understanding of plan for CCM RN 's assistance with  connecting patient  with resources to have questions and concerns addressed regarding Lovera treatment to left knee.   Interventions:  . Inter-disciplinary care team collaboration (see longitudinal plan of care) . Collaborated with [facility] support team Marcelyn Bruins and Dr. Sandi Mariscal Interventionist regarding requested they outreach to patient as soon as possible regarding her post procedure concerns and questions  Patient Self Care Activities:  . Patient verbalizes understanding of plan for CCM RN to contact [facility] support team to request to contact patient to address her post procedure questions and concerns . Unable to independently contact [facility] support team to address her post procedure questions and concerns  Initial goal documentation         Plan:   The care management team will reach out to the patient again over the next 7 days.    Kelli Churn RN, CCM, [facility] RN Care Manager [phone]

## 2019-08-19 NOTE — Progress Notes (Signed)
Internal Medicine Clinic Attending  CCM services provided by the care management provider and their documentation were discussed with Dr. Aslam. We reviewed the pertinent findings, urgent action items addressed by the resident and non-urgent items to be addressed by the PCP.  I agree with the assessment, diagnosis, and plan of care documented in the CCM and resident's note.  Ason Heslin, MD 08/19/2019  

## 2019-08-21 ENCOUNTER — Other Ambulatory Visit: Payer: Self-pay

## 2019-08-21 ENCOUNTER — Ambulatory Visit: Payer: Medicare Other

## 2019-08-21 ENCOUNTER — Ambulatory Visit (INDEPENDENT_AMBULATORY_CARE_PROVIDER_SITE_OTHER): Payer: Medicare Other | Admitting: Internal Medicine

## 2019-08-21 ENCOUNTER — Encounter: Payer: Self-pay | Admitting: Internal Medicine

## 2019-08-21 VITALS — BP 147/65 | HR 64 | Temp 98.3°F | Ht 74.0 in | Wt 387.1 lb

## 2019-08-21 DIAGNOSIS — I10 Essential (primary) hypertension: Secondary | ICD-10-CM | POA: Diagnosis not present

## 2019-08-21 DIAGNOSIS — Z720 Tobacco use: Secondary | ICD-10-CM

## 2019-08-21 DIAGNOSIS — I480 Paroxysmal atrial fibrillation: Secondary | ICD-10-CM | POA: Diagnosis not present

## 2019-08-21 DIAGNOSIS — Z1211 Encounter for screening for malignant neoplasm of colon: Secondary | ICD-10-CM | POA: Diagnosis not present

## 2019-08-21 DIAGNOSIS — Z7901 Long term (current) use of anticoagulants: Secondary | ICD-10-CM

## 2019-08-21 DIAGNOSIS — G894 Chronic pain syndrome: Secondary | ICD-10-CM

## 2019-08-21 DIAGNOSIS — R32 Unspecified urinary incontinence: Secondary | ICD-10-CM | POA: Insufficient documentation

## 2019-08-21 DIAGNOSIS — M170 Bilateral primary osteoarthritis of knee: Secondary | ICD-10-CM

## 2019-08-21 DIAGNOSIS — J439 Emphysema, unspecified: Secondary | ICD-10-CM

## 2019-08-21 DIAGNOSIS — N3946 Mixed incontinence: Secondary | ICD-10-CM | POA: Insufficient documentation

## 2019-08-21 DIAGNOSIS — F17210 Nicotine dependence, cigarettes, uncomplicated: Secondary | ICD-10-CM

## 2019-08-21 DIAGNOSIS — Z0000 Encounter for general adult medical examination without abnormal findings: Secondary | ICD-10-CM

## 2019-08-21 DIAGNOSIS — J381 Polyp of vocal cord and larynx: Secondary | ICD-10-CM

## 2019-08-21 DIAGNOSIS — J449 Chronic obstructive pulmonary disease, unspecified: Secondary | ICD-10-CM

## 2019-08-21 DIAGNOSIS — I5032 Chronic diastolic (congestive) heart failure: Secondary | ICD-10-CM

## 2019-08-21 MED ORDER — ALBUTEROL SULFATE HFA 108 (90 BASE) MCG/ACT IN AERS: 2.0000 | INHALATION_SPRAY | Freq: Four times a day (QID) | RESPIRATORY_TRACT | 3 refills | Status: DC | PRN

## 2019-08-21 MED ORDER — GABAPENTIN 400 MG PO CAPS: 400.0000 mg | ORAL_CAPSULE | Freq: Two times a day (BID) | ORAL | 2 refills | Status: DC

## 2019-08-21 NOTE — Patient Instructions (Signed)
Thank you for allowing [place] to care for you  For your blood pressure and heart failure - Continue current medication - Follow up with cardiologist  Continue current inhalers  Follow up with pain management and orthopedics for pain  Good job on cutting back on tobacco! Keep it up.  Referral to Urology and Pelvic rehab for your incontinence - I have also ordered supplies  Referral to ENT for your history of polyp and difficulty swallowing  Follow up in about 3 months

## 2019-08-21 NOTE — Progress Notes (Signed)
Internal Medicine Clinic Resident  I have personally reviewed this encounter including the documentation in this note and/or discussed this patient with the care management provider. I will address any urgent items identified by the care management provider and will communicate my actions to the patient's PCP. I have reviewed the patient's CCM visit with my supervising attending, Dr Vincent.  Emanuella Nickle, MD  Internal Medicine, PGY-1 08/21/2019    

## 2019-08-21 NOTE — Chronic Care Management (AMB) (Signed)
Care Management   Follow Up Note   08/21/2019 Name: Sharon Vasquez [name]: [id number] DOB: October 22, 1950  Referred by: Sharon Seat, MD Reason for referral : Care Coordination (DME, PCS)   Sharon Vasquez is a 69 y.o. year old female who is a primary care patient of Sharon Seat, MD. The care management team was consulted for assistance with care management and care coordination needs.    Review of patient status, including review of consultants reports, relevant laboratory and other test results, and collaboration with appropriate care team members and the patient's provider was performed as part of comprehensive patient evaluation and provision of chronic care management services.    SDOH (Social Determinants of Health) assessments performed: No See Care Plan activities for detailed interventions related to [facility])     Advanced Directives: See Care Plan and Vynca application for related entries.   Goals Addressed            This Visit's Progress   . COMPLETED: " I had this procedure on my knee and I want to talk to somebody about the problems I'm having since the procedure." (pt-stated)       [place] (see longitudinal plan of care for additional care plan information)  Current Barriers:  [name] Knowledge Deficits related to Sharon Vasquez Treatment on Left Knee post procedure assistance for concerns and questions. - patient returned call to this CCM RN and is requesting assistance in contacting the appropriate resource to address her questions and concerns related to  post Lovera Rx to left knee done 07/23/19  Nurse Case Manager Clinical Goal(s):  [name] Over the next 7 days, patient will verbalize understanding of plan for CCM RN 's assistance with  connecting patient with resources to have questions and concerns addressed regarding Lovera treatment to left knee.   Interventions:  . Inter-disciplinary care team collaboration (see longitudinal plan of care) . Patient confirmed that she  was contacted by Dr Pascal Lux Interventionist regarding questions and concerns.    Patient Self Care Activities:  . Patient verbalizes understanding of plan for CCM RN to contact [facility] support team to request to contact patient to address her post procedure questions and concerns . Unable to independently contact [facility] support team to address her post procedure questions and concerns  Please see past updates related to this goal by clicking on the "Past Updates" button in the selected goal      . COMPLETED: "I need a scooter to help me get around"       Current Barriers:  [name] Knowledge Barriers related to resources and support available to address needs related to Inability to perform ADL's independently; need for DME.  Case Manager Clinical Goal(s):  [name] Over the next 30 days, patient will work with BSW to address needs related to Inability to perform ADL's independently, need for DME.   . Over the next 30 days, BSW will collaborate with RN Care Manager to address care management and care coordination needs  Interventions:  . Collaborated with RN, Howell Rucks, regarding patients request for scooter.  Per Sharon. Ducatte, she discussed this with patient during office visit today.  Patient informed that, per [place] representatives, cost of scooter is not covered by insurance.    Patient Self Care Activities:  . Attends all scheduled provider appointments . Calls provider office for new concerns or questions  Please see past updates related to this goal by clicking on the "Past Updates" button in the selected goal      . "  I need incontinence supplies" (pt-stated)       CARE PLAN ENTRY (see longitudinal plan of care for additional care plan information)  Current Barriers:  . Need for incontinence supplies due to fixed income  Clinical Social Work Clinical Goal(s):  [name] Over the next 30 days, patient will work with SW to address concerns related to need for incontinence  supplies  Interventions: . Inter-disciplinary care team collaboration (see longitudinal plan of care) . Collaborated with RN, Howell Rucks, regarding patient's need for supplies.  Per Sharon. Ducatte, provider is aware of need to place order for supplies . Will submit order and other required documentation to [employer]  Patient Self Care Activities:  . Self administers medications as prescribed . Attends all scheduled provider appointments . Calls provider office for new concerns or questions . Unable to perform ADLs independently . Unable to perform IADLs independently  Initial goal documentation         The patient has been provided with contact information for the care management team and has been advised to call with any health related questions or concerns.    Ronn Melena, [place] Coordination Social Worker [place] [phone]

## 2019-08-21 NOTE — Progress Notes (Signed)
Internal Medicine Clinic Attending  CCM services provided by the care management provider and their documentation were discussed with Dr. Aslam. We reviewed the pertinent findings, urgent action items addressed by the resident and non-urgent items to be addressed by the PCP.  I agree with the assessment, diagnosis, and plan of care documented in the CCM and resident's note.  Harjit Leider Thomas Ronesha Heenan, MD 08/21/2019  

## 2019-08-21 NOTE — Patient Instructions (Signed)
Visit Information  Goals Addressed            This Visit's Progress   . COMPLETED: " I had this procedure on my knee and I want to talk to somebody about the problems I'm having since the procedure." (pt-stated)       [place] (see longitudinal plan of care for additional care plan information)  Current Barriers:  [name] Knowledge Deficits related to [facility] Treatment on Left Knee post procedure assistance for concerns and questions. - patient returned call to this CCM RN and is requesting assistance in contacting the appropriate resource to address her questions and concerns related to  post Lovera Rx to left knee done 07/23/19  Nurse Case Manager Clinical Goal(s):  [name] Over the next 7 days, patient will verbalize understanding of plan for CCM RN 's assistance with  connecting patient with resources to have questions and concerns addressed regarding Lovera treatment to left knee.   Interventions:  . Inter-disciplinary care team collaboration (see longitudinal plan of care) . Patient confirmed that she was contacted by Dr Pascal Lux Interventionist regarding questions and concerns.    Patient Self Care Activities:  . Patient verbalizes understanding of plan for CCM RN to contact [facility] support team to request to contact patient to address her post procedure questions and concerns . Unable to independently contact Lovera support team to address her post procedure questions and concerns  Please see past updates related to this goal by clicking on the "Past Updates" button in the selected goal      . COMPLETED: "I need a scooter to help me get around"       Current Barriers:  [name] Knowledge Barriers related to resources and support available to address needs related to Inability to perform ADL's independently; need for DME.  Case Manager Clinical Goal(s):  [name] Over the next 30 days, patient will work with BSW to address needs related to Inability to perform ADL's independently, need for DME.    . Over the next 30 days, BSW will collaborate with RN Care Manager to address care management and care coordination needs  Interventions:  . Collaborated with RN, Howell Rucks, regarding patients request for scooter.  Per Ms. Ducatte, she discussed this with patient during office visit today.  Patient informed that, per [place] representatives, cost of scooter is not covered by insurance.    Patient Self Care Activities:  . Attends all scheduled provider appointments . Calls provider office for new concerns or questions  Please see past updates related to this goal by clicking on the "Past Updates" button in the selected goal      . "I need incontinence supplies" (pt-stated)       CARE PLAN ENTRY (see longitudinal plan of care for additional care plan information)  Current Barriers:  . Need for incontinence supplies due to fixed income  Clinical Social Work Clinical Goal(s):  [name] Over the next 30 days, patient will work with SW to address concerns related to need for incontinence supplies  Interventions: . Inter-disciplinary care team collaboration (see longitudinal plan of care) . Collaborated with RN, Howell Rucks, regarding patient's need for supplies.  Per Ms. Ducatte, provider is aware of need to place order for supplies . Will submit order and other required documentation to [employer]  Patient Self Care Activities:  . Self administers medications as prescribed . Attends all scheduled provider appointments . Calls provider office for new concerns or questions . Unable to perform ADLs independently .  Unable to perform IADLs independently  Initial goal documentation        Patient verbalizes understanding of instructions provided today.   The patient has been provided with contact information for the care management team and has been advised to call with any health related questions or concerns.     Ronn Melena, [place] Coordination Social  Worker [place] [phone]

## 2019-08-21 NOTE — Progress Notes (Signed)
   CC: Hypertension, Atrial Fibrillation, COPD, Vocal Cord Polyp, Knee Arthritis, Incontinence, Obesity, Tobacco Use, Preventative Health Care  HPI:  [name] is a 69 y.o. F with PMHx listed below presenting for Hypertension, Atrial Fibrillation, COPD, Vocal Cord Polyp, Knee Arthritis, Incontinence, Obesity, Tobacco Use, Preventative Health Care. Please see the A&P for the status of the patient's chronic medical problems.   Past Medical History:  Diagnosis Date  . A-fib ([place])   . Anxiety   . Arthritis    "qwhre; joints, back" (04/17/2017)  . Benign breast cyst in female, left 01/08/2017   Found by Screening mammogram, evaluated by U/S on 01/08/17 and determined to be a benign simple breast cyst.  . Cellulitis of left lower leg 05/30/2017  . CHF (congestive heart failure) ([place])   . Chronic low back pain 08/21/2016  . Chronic lower back pain   . Chronic venous insufficiency    [name] 05/30/2017  . COPD (chronic obstructive pulmonary disease) ([place])   . Depression   . DVT (deep venous thrombosis) ([place]) 11/16/2016  . GERD (gastroesophageal reflux disease)   . Headache    "weekly for the last 3 months" (04/17/2017)  . Hyperlipidemia   . Hypertension   . Morbid obesity ([place])   . PE (pulmonary embolism)   . Pulmonary embolism ([place]) 09/21/2014   Review of Systems:  Performed and all others negative.  Physical Exam:  Vitals:   08/21/19 1438 08/21/19 1442  BP:  (!) 147/65  Pulse:  64  Temp:  98.3 F (36.8 C)  TempSrc:  Oral  SpO2:  97%  Weight: (!) 387 lb 1.6 oz (175.6 kg)   Height: 6\' 2"  (1.88 m)    Physical Exam Constitutional:      General: She is not in acute distress.    Appearance: Normal appearance. She is obese.     Comments: In powered wheelchair  Cardiovascular:     Rate and Rhythm: Normal rate and regular rhythm.     Pulses: Normal pulses.     Heart sounds: Normal heart sounds.  Pulmonary:     Effort: Pulmonary effort is normal. No respiratory distress.    Comments: Trace bibasilar crackles Abdominal:     General: Bowel sounds are normal. There is no distension.     Palpations: Abdomen is soft.     Tenderness: There is no abdominal tenderness.  Musculoskeletal:     Right lower leg: Edema present.     Left lower leg: Edema present.     Comments: Chronic mild LE edema with stasis changes  Skin:    General: Skin is warm and dry.  Neurological:     General: No focal deficit present.     Mental Status: Mental status is at baseline.    Assessment & Plan:   See Encounters Tab for problem based charting.  Patient discussed with Dr. Evette Doffing

## 2019-08-22 ENCOUNTER — Telehealth: Payer: Medicare Other

## 2019-08-22 LAB — FECAL OCCULT BLOOD, IMMUNOCHEMICAL: Fecal Occult Bld: NEGATIVE

## 2019-08-24 ENCOUNTER — Encounter: Payer: Self-pay | Admitting: Internal Medicine

## 2019-08-24 NOTE — Assessment & Plan Note (Signed)
Reiterated the importance of diet and exercise to help with weight loss.

## 2019-08-24 NOTE — Assessment & Plan Note (Signed)
Patient has ongoing knee pain from her OA. She is following  With orthopedics for this. She is not an operative candidate. She was referred for nerve ablation by IR, but this has not been affective for her. She will follow up with ortho for next steps. Of note she also follows with a pain clinic.

## 2019-08-24 NOTE — Assessment & Plan Note (Signed)
BP 147/65 today. She was seen in the ED recently for elevated BP and headache. BP improved while there and she was sent home. She states she has not been taking her hydralazine regularly. She was encouraged and agreed to take all medications as prescribed. Will continue current regimen. - MetoprololSuccinate 100mg  Daily - Benazepril 40mg  Daily - Torsemide 40mg  Daily - Hydralazine50mg  TID

## 2019-08-24 NOTE — Assessment & Plan Note (Signed)
Patient describes symptoms of long term stress incontinence that may have started with child birth. She states she had a procedure where her bladder was "tacked up" in the past (we do not have records for this). This is an ongoing issue for her. More recently, she has had worsening stress incontinence where she is unable to make it to the restroom. This is exacerbated by her use of diuretic for heart failure. She has been buying some supplies to help with this. We will work to get supplies for her if possible, refer for pelvic rehab for pelvic muscle strengthening/control, and refer to urology given her history of prior procedure. - Incontinence supplies - Refer to PT for pelvic rehab  - Refer to urology

## 2019-08-24 NOTE — Assessment & Plan Note (Signed)
Patient has history of COPD, followed by Pulmonology. Controlled on current regimen. -Advair BID -Spiriva Daily - PRN albuterol nebs - Refill PRN albuterol inhaler

## 2019-08-24 NOTE — Assessment & Plan Note (Signed)
Patient has reduced cigarette use to almost 1 pack per week. She went 7 days without a cigarette recently. Congratulated her on her progress and continued to encourage cessation.

## 2019-08-24 NOTE — Assessment & Plan Note (Signed)
-   fecal occult colon cancer screening test

## 2019-08-24 NOTE — Assessment & Plan Note (Signed)
RRR today. On Xarelto, Amiodarone (reduced to 100mg  due to possible lung toxicity, follows with cards and pulm), and Metoprolol. - Xarelto 20mg  Daily -Amiodarone 100mg  Daily - Metoprolol Succinate 100mg  Daily

## 2019-08-24 NOTE — Assessment & Plan Note (Signed)
Patient has a history of vocal cord polyp and has been reporting intermittent throat pain, sensations of fullness, and, at times, some difficulty swallowing. Will refer to ENT for thorough evaluation given history of polyp. - Referral to ENT

## 2019-08-25 ENCOUNTER — Encounter: Payer: Self-pay | Admitting: Internal Medicine

## 2019-08-25 NOTE — Progress Notes (Signed)
Internal Medicine Clinic Attending  Case discussed with Dr. Melvin  at the time of the visit.  We reviewed the resident's history and exam and pertinent patient test results.  I agree with the assessment, diagnosis, and plan of care documented in the resident's note.  

## 2019-08-25 NOTE — Progress Notes (Signed)
Letter sent with normal results.

## 2019-08-26 ENCOUNTER — Telehealth: Payer: Medicare Other

## 2019-08-26 ENCOUNTER — Ambulatory Visit: Payer: Self-pay

## 2019-08-26 DIAGNOSIS — I5032 Chronic diastolic (congestive) heart failure: Secondary | ICD-10-CM

## 2019-08-26 DIAGNOSIS — J439 Emphysema, unspecified: Secondary | ICD-10-CM

## 2019-08-26 NOTE — Progress Notes (Signed)
Internal Medicine Clinic Resident  I have personally reviewed this encounter including the documentation in this note and/or discussed this patient with the care management provider. I will address any urgent items identified by the care management provider and will communicate my actions to the patient's PCP. I have reviewed the patient's CCM visit with my supervising attending, Dr Butcher.  Vahini Chundi, MD 08/26/2019    

## 2019-08-26 NOTE — Chronic Care Management (AMB) (Signed)
  Care Management   Follow Up Note   08/26/2019 Name: Sharon Vasquez [name]: [id number] DOB: 09-Mar-1951  Referred by: Sharon Seat, MD Reason for referral : Care Coordination (Incontinence Supplies )   Sharon Vasquez is a 69 y.o. year old female who is a primary care patient of Sharon Seat, MD. The care management team was consulted for assistance with care management and care coordination needs.    Review of patient status, including review of consultants reports, relevant laboratory and other test results, and collaboration with appropriate care team members and the patient's provider was performed as part of comprehensive patient evaluation and provision of chronic care management services.    SDOH (Social Determinants of Health) assessments performed: No See Care Plan activities for detailed interventions related to [facility])     Advanced Directives: See Care Plan and Vynca application for related entries.   Goals Addressed            This Visit's Progress   . COMPLETED: "I need incontinence supplies" (pt-stated)       CARE PLAN ENTRY (see longitudinal plan of care for additional care plan information)  Current Barriers:  . Need for incontinence supplies due to fixed income  Clinical Social Work Clinical Goal(s):  [name] Over the next 30 days, patient will work with SW to address concerns related to need for incontinence supplies  Interventions: . Received completed [employer] Incontinence Order Form from Dr. Trilby Drummer, however, these supplies are only covered by Medicaid.   . Contacted patient to inform her of this.  Patient aware and states that she has ordered supplies through [facility].  .  .  . Patient Self Care Activities:  . Self administers medications as prescribed . Attends all scheduled provider appointments . Calls provider office for new concerns or questions . Unable to perform ADLs independently . Unable to perform IADLs independently  Please see past updates  related to this goal by clicking on the "Past Updates" button in the selected goal          The patient has been provided with contact information for the care management team and has been advised to call with any health related questions or concerns.     Sharon Vasquez, [place] Coordination Social Worker [place] [phone]

## 2019-08-26 NOTE — Patient Instructions (Signed)
Visit Information  Goals Addressed            This Visit's Progress   . COMPLETED: "I need incontinence supplies" (pt-stated)       CARE PLAN ENTRY (see longitudinal plan of care for additional care plan information)  Current Barriers:  . Need for incontinence supplies due to fixed income  Clinical Social Work Clinical Goal(s):  [name] Over the next 30 days, patient will work with SW to address concerns related to need for incontinence supplies  Interventions: . Received completed [employer] Incontinence Order Form from Dr. Trilby Drummer, however, these supplies are only covered by Medicaid.   . Contacted patient to inform her of this.  Patient aware and states that she has ordered supplies through [facility].  .  .  . Patient Self Care Activities:  . Self administers medications as prescribed . Attends all scheduled provider appointments . Calls provider office for new concerns or questions . Unable to perform ADLs independently . Unable to perform IADLs independently  Please see past updates related to this goal by clicking on the "Past Updates" button in the selected goal         Patient verbalizes understanding of instructions provided today.   The patient has been provided with contact information for the care management team and has been advised to call with any health related questions or concerns.     Ronn Melena, [place] Coordination Social Worker [place] [phone]

## 2019-09-01 DIAGNOSIS — M7989 Other specified soft tissue disorders: Secondary | ICD-10-CM | POA: Diagnosis not present

## 2019-09-01 DIAGNOSIS — M170 Bilateral primary osteoarthritis of knee: Secondary | ICD-10-CM | POA: Diagnosis not present

## 2019-09-01 DIAGNOSIS — Z79899 Other long term (current) drug therapy: Secondary | ICD-10-CM | POA: Diagnosis not present

## 2019-09-10 ENCOUNTER — Encounter: Payer: Self-pay | Admitting: *Deleted

## 2019-09-15 DIAGNOSIS — M1712 Unilateral primary osteoarthritis, left knee: Secondary | ICD-10-CM | POA: Diagnosis not present

## 2019-09-15 DIAGNOSIS — M25562 Pain in left knee: Secondary | ICD-10-CM | POA: Diagnosis not present

## 2019-09-15 DIAGNOSIS — M1711 Unilateral primary osteoarthritis, right knee: Secondary | ICD-10-CM | POA: Diagnosis not present

## 2019-09-15 DIAGNOSIS — M25561 Pain in right knee: Secondary | ICD-10-CM | POA: Diagnosis not present

## 2019-09-17 ENCOUNTER — Ambulatory Visit: Payer: Medicare Other | Admitting: *Deleted

## 2019-09-17 DIAGNOSIS — J439 Emphysema, unspecified: Secondary | ICD-10-CM

## 2019-09-17 DIAGNOSIS — I5032 Chronic diastolic (congestive) heart failure: Secondary | ICD-10-CM

## 2019-09-17 DIAGNOSIS — M6281 Muscle weakness (generalized): Secondary | ICD-10-CM | POA: Diagnosis not present

## 2019-09-17 DIAGNOSIS — I10 Essential (primary) hypertension: Secondary | ICD-10-CM

## 2019-09-17 DIAGNOSIS — M62838 Other muscle spasm: Secondary | ICD-10-CM | POA: Diagnosis not present

## 2019-09-17 NOTE — Progress Notes (Signed)
Internal Medicine Clinic Resident  I have personally reviewed this encounter including the documentation in this note and/or discussed this patient with the care management provider. I will address any urgent items identified by the care management provider and will communicate my actions to the patient's PCP. I have reviewed the patient's CCM visit with my supervising attending, Dr Philipp Ovens.  Dewayne Hatch, MD 09/17/2019

## 2019-09-17 NOTE — Chronic Care Management (AMB) (Signed)
Chronic Care Management   Follow Up Note   09/17/2019 Name: Sharon Vasquez [name]: [id number] DOB: Jan 18, 1951  Referred by: Neva Seat, MD Reason for referral : Chronic Care Management (COPD, HF, HTN)   Sharon Vasquez is a 69 y.o. year old female who is a primary care patient of Neva Seat, MD. The CCM team was consulted for assistance with chronic disease management and care coordination needs.    Review of patient status, including review of consultants reports, relevant laboratory and other test results, and collaboration with appropriate care team members and the patient's provider was performed as part of comprehensive patient evaluation and provision of chronic care management services.    SDOH (Social Determinants of Health) assessments performed: No See Care Plan activities for detailed interventions related to [facility])     Outpatient Encounter Medications as of 09/17/2019  Medication Sig Note  . albuterol (PROAIR HFA) 108 (90 Base) MCG/ACT inhaler Inhale 2 puffs into the lungs every 6 (six) hours as needed for wheezing or shortness of breath.   [name] albuterol (PROVENTIL) (2.5 MG/3ML) 0.083% nebulizer solution TAKE 3 MLS(1 AMPULE) BY NEBULIZATION EVERY 4 HOURS AS NEEDED FOR [facility] OR SHORTNESS OF BREATH 06/24/2019: Use once every two weeks  . amiodarone (PACERONE) 200 MG tablet Take 0.5 tablets (100 mg total) by mouth daily.   [name] aspirin EC 81 MG tablet Take 81 mg by mouth daily.   [name] atorvastatin (LIPITOR) 40 MG tablet Take 1 tablet (40 mg total) by mouth daily.   . benazepril (LOTENSIN) 40 MG tablet Take 1 tablet (40 mg total) by mouth daily.   [name] esomeprazole (NEXIUM) 40 MG capsule Take 1 capsule (40 mg total) by mouth daily as needed (for heartburn or indigestion).   . Fluticasone-Salmeterol (ADVAIR DISKUS) 250-50 MCG/DOSE AEPB Inhale 1 puff into the lungs 2 (two) times daily. Rinse mouth after each use   . gabapentin (NEURONTIN) 400 MG capsule Take 1 capsule (400 mg total)  by mouth 2 (two) times daily.   . hydrALAZINE (APRESOLINE) 50 MG tablet Take 0.5 tablets (25 mg total) by mouth 3 (three) times daily.   . metoprolol succinate (TOPROL XL) 100 MG 24 hr tablet Take 1 tablet (100 mg total) by mouth daily. Take with or immediately following a meal.   . oxyCODONE-acetaminophen (PERCOCET/ROXICET) 5-325 MG tablet Take 1 tablet by mouth daily as needed for severe pain. 06/24/2019: Takes every 6 hours round the clock  . potassium chloride (KLOR-CON) 10 MEQ tablet TAKE 2 TABLETS(20 MEQ) BY MOUTH TWICE DAILY   . rivaroxaban (XARELTO) 20 MG TABS tablet Take 1 tablet (20 mg total) by mouth every morning.   [name] SPIRIVA HANDIHALER 18 MCG inhalation capsule PLACE 1 CAPSULE INTO INHALER AND INHALE DAILY   . torsemide (DEMADEX) 20 MG tablet TAKE 2 TABLETS BY MOUTH DAILY    No facility-administered encounter medications on file as of 09/17/2019.     Objective:  Wt Readings from Last 3 Encounters:  08/21/19 (!) 387 lb 1.6 oz (175.6 kg)  06/29/19 (!) 350 lb (158.8 kg)  06/05/19 (!) 362 lb 3.2 oz (164.3 kg)   BP Readings from Last 3 Encounters:  08/21/19 (!) 147/65  06/30/19 (!) 171/90  06/05/19 117/66    Goals Addressed              This Visit's Progress     Patient Stated   .  "I try to do what my doctors tell me to do" (pt-stated)  CARE PLAN ENTRY (see longitudinal plan of care for additional care plan information)  Current Barriers:  . Chronic Disease Management support, education, and care coordination needs related to Atrial Fibrillation, CHF, HTN, and COPD-patient states she is doing well , says her home monitored blood pressure readings are back to normal and she is taking her 3 blood pressure medicines as prescribed, she reports increased dyspnea and cough but no significant decrease in her pulse oximetry readings, so she made an appointment to see  Dr. Elsworth Soho (pulmonologist)  tomorrow, says she bought scales for home use but hasn't figured out how to  change th units from kgs to lbs. She says she continues to see her pain provider Lucky Cowboy NP monthly and saw a sports medicine MD yesterday and she received injections in both knees that have significantly helped the pain, she says is using a Nicotine patch and has cut back to one cigarette a day and has stopped buying them. She said she had her first pelvic rehab session today and when that is complete she will follow up on the urology referral, she says she will see Dr Lucia Gaskins ENT or the polyps on her vocal cord on 09/25/19  Clinical Goal(s) related to Atrial Fibrillation, CHF, HTN, and COPD:  Over the next 30 days, patient will:  . Work with the care management team to address educational, disease management, and care coordination needs  . Begin or continue self health monitoring activities as directed today Measure and record blood pressure 5-7 times per week and Measure and record weight daily . Call provider office for new or worsened signs and symptoms Blood pressure findings outside established parameters, Weight outside established parameters, Shortness of breath, and New or worsened symptom related to CHF and COPD . Call care management team with questions or concerns . Verbalize basic understanding of patient centered plan of care established today  Interventions related to Atrial Fibrillation, CHF, HTN, and COPD:  . Appropriate assessments completed . Provided positive reinforcement to patient for recognizing the change in her COPD baseline and making an appointment with her pulmonologist, congratulated her on her efforts to stop smoking and for keeping her many provider appointments . Reviewed upcoming appointments and ensured she has transportation  Patient Self Care Activities related to Atrial Fibrillation, CHF, HTN, and COPD:  . Patient is unable to independently self-manage chronic health conditions  Please see past updates related to this goal by clicking on the "Past  Updates" button in the selected goal          Plan:   The care management team will reach out to the patient again over the next 30 days.    Kelli Churn RN, CCM, [facility] RN Care Manager [phone]

## 2019-09-17 NOTE — Patient Instructions (Signed)
Visit Information It was nice speaking with you today. Congratulations on all the steps you are taking to improve and maintain your health.  Goals Addressed              This Visit's Progress     Patient Stated   .  "I try to do what my doctors tell me to do" (pt-stated)        [place] (see longitudinal plan of care for additional care plan information)  Current Barriers:  . Chronic Disease Management support, education, and care coordination needs related to Atrial Fibrillation, CHF, HTN, and COPD-patient states she is doing well , says her home monitored blood pressure readings are back to normal and she is taking her 3 blood pressure medicines as prescribed, she reports increased dyspnea and cough but no significant decreased in her pulse oximetry readings so she made an appointment to see  Dr Elsworth Soho tomorrow, says she bought scales for home use but hasn't figured out how to change th units from kgs to lbs. She says she continues to see her pain provider Lucky Cowboy NP monthly and saw a sports medicine MD yesterday and she received injections in both knees that have significantly helped the pain, she says is using a Nicotine patch and has cut back to one cigarette a day and has stopped buying them. She said she had her first pelvic rehab session today and when that is complete she will follow up on the urology referral, she says she will see Dr Lucia Gaskins ENT or the polyps on her vocal cord on 09/25/19  Clinical Goal(s) related to Atrial Fibrillation, CHF, HTN, and COPD:  Over the next 30 days, patient will:  . Work with the care management team to address educational, disease management, and care coordination needs  . Begin or continue self health monitoring activities as directed today Measure and record blood pressure 5-7 times per week and Measure and record weight daily . Call provider office for new or worsened signs and symptoms Blood pressure findings outside established  parameters, Weight outside established parameters, Shortness of breath, and New or worsened symptom related to CHF and COPD . Call care management team with questions or concerns . Verbalize basic understanding of patient centered plan of care established today  Interventions related to Atrial Fibrillation, CHF, HTN, and COPD:  . Appropriate assessments completed . Provided positive reinforcement to patient for recognizing the change in her COPD baseline and making an appointment with her pulmonologist, congratulated her on her efforts to stop smoking and for keeping her many provider appointments . Reviewed upcoming appointments and ensured she has transportation  Patient Self Care Activities related to Atrial Fibrillation, CHF, HTN, and COPD:  . Patient is unable to independently self-manage chronic health conditions  Please see past updates related to this goal by clicking on the "Past Updates" button in the selected goal         The patient verbalized understanding of instructions provided today and declined a print copy of patient instruction materials.   The care management team will reach out to the patient again over the next 30 days.   Kelli Churn RN, CCM, [facility] RN Care Manager [phone]

## 2019-09-18 ENCOUNTER — Other Ambulatory Visit: Payer: Self-pay

## 2019-09-18 ENCOUNTER — Other Ambulatory Visit: Payer: Self-pay | Admitting: Pulmonary Disease

## 2019-09-18 ENCOUNTER — Encounter: Payer: Self-pay | Admitting: Pulmonary Disease

## 2019-09-18 ENCOUNTER — Ambulatory Visit (INDEPENDENT_AMBULATORY_CARE_PROVIDER_SITE_OTHER): Payer: Medicare Other | Admitting: Pulmonary Disease

## 2019-09-18 VITALS — BP 120/86 | HR 93 | Temp 98.6°F | Ht 74.0 in | Wt 385.8 lb

## 2019-09-18 DIAGNOSIS — G4733 Obstructive sleep apnea (adult) (pediatric): Secondary | ICD-10-CM | POA: Diagnosis not present

## 2019-09-18 DIAGNOSIS — J849 Interstitial pulmonary disease, unspecified: Secondary | ICD-10-CM

## 2019-09-18 DIAGNOSIS — I5033 Acute on chronic diastolic (congestive) heart failure: Secondary | ICD-10-CM

## 2019-09-18 DIAGNOSIS — J439 Emphysema, unspecified: Secondary | ICD-10-CM | POA: Diagnosis not present

## 2019-09-18 DIAGNOSIS — R0600 Dyspnea, unspecified: Secondary | ICD-10-CM

## 2019-09-18 DIAGNOSIS — R0609 Other forms of dyspnea: Secondary | ICD-10-CM

## 2019-09-18 MED ORDER — POTASSIUM CHLORIDE CRYS ER 10 MEQ PO TBCR: EXTENDED_RELEASE_TABLET | ORAL | 1 refills | Status: DC

## 2019-09-18 NOTE — Assessment & Plan Note (Signed)
She is compliant with CPAP and this is certainly helped improve her daytime somnolence and fatigue. Continue CPAP 13 cm. She does not seem to qualify for oxygen at rest and is unable to ambulate today to check

## 2019-09-18 NOTE — Assessment & Plan Note (Signed)
She does not have significant airway obstruction on her PFTs. Doubt that this is related to COPD

## 2019-09-18 NOTE — Assessment & Plan Note (Addendum)
-  worsening breathing may be related to fluid or fibrosis in your lungs.  Increase torsemide to 40 mg in the morning and an extra 20 mg around 3 PM Refills will be sent in for potassium Goal would be weight loss of about 10 pounds in the next 2 weeks to see if your breathing improves  Revisit in 2 weeks and check be met

## 2019-09-18 NOTE — Patient Instructions (Signed)
Your worsening breathing may be related to fluid or fibrosis in your lungs.  Increase torsemide to 40 mg in the morning and an extra 20 mg around 3 PM Refills will be sent in potassium Goal would be weight loss of about 10 pounds in the next 2 weeks to see if your breathing improves  Schedule high-resolution CT scan of the chest. If worsening fibrosis, we will have to stop amiodarone You have to quit smoking!

## 2019-09-18 NOTE — Assessment & Plan Note (Signed)
Schedule high-resolution CT scan of the chest. If worsening fibrosis, we will have to stop amiodarone You have to quit smoking!

## 2019-09-18 NOTE — Progress Notes (Signed)
   Subjective:    Patient ID: Sharon Vasquez, female    DOB: Aug 12, 1950, 69 y.o.   [name]: [id number]  HPI  69 yo morbidly obese smoker for follow-up of ILD and OSA She smoked up to 2 packs/day, has now decreased to about 2 cigarettes daily  PMH - chronic diastolic heart failure.She has a history of pulmonary embolism/ LLE DVT2015 and has been maintained on Xarelto since then. She underwent venoplasty and stenting of left common iliac and external iliac vein Duplex 05/2017 did not show any residual stenosis. -on amiodarone and Xarelto for chronic atrial fibrillation   Annual follow-up. Breathing is much worse, she reports class III NYHA symptoms unable to carry out activities of daily living, in fact using wheelchair more. Gained about 40 pounds from 345 pounds last visit to 385 pounds now. She underwent peripheral nerve cryoablation in her left foot but this did not work, steroid injections in her knees seems to help better. She is using her CPAP even during the daytime, using it "like oxygen". No problems with mask or pressure. Download was reviewed which shows excellent compliance on 70 cm, no residual events, large leak  Labs reviewed 06/2019 BUN/creatinine 13/0.97. She continues to smoke 4 to 5 cigarettes a week although this is much improved from prior  Significant tests/ events reviewed  HRCT 12/2017 >> Mild interlobular septal thickening and mild patchy peribronchovascular ground-glass opacity in both lungs. Minimal patchy subpleural reticulation in the upper lobes ? Pul edema vs RB-ILD  PFTs9/2019- No obstruction, mild-mod restriction, decreased DLCO  FVC 2.16 (66%), FEV1 1.93 (75%), ratio 89, DLCOcor 16.45 (48%)  FENO 12/2017 5  ECHO 07/2016-EF 60-65%, PA peak pressure 41mm Hg  HST 12/2017 >> AHI 20/h Review of Systems neg for any significant sore throat, dysphagia, itching, sneezing, nasal congestion or excess/ purulent secretions, fever, chills, sweats,  unintended wt loss, pleuritic or exertional cp, hempoptysis, orthopnea pnd or change in chronic leg swelling. Also denies presyncope, palpitations, heartburn, abdominal pain, nausea, vomiting, diarrhea or change in bowel or urinary habits, dysuria,hematuria, rash, arthralgias, visual complaints, headache, numbness weakness or ataxia.     Objective:   Physical Exam  Gen. Pleasant, obese, in no distress ENT - no lesions, no post nasal drip Neck: No JVD, no thyromegaly, no carotid bruits Lungs: no use of accessory muscles, no dullness to percussion, RT basal rales no rhonchi  Cardiovascular: Rhythm regular, heart sounds  normal, no murmurs or gallops, 2+ peripheral edema Musculoskeletal: No deformities, no cyanosis or clubbing , no tremors       Assessment & Plan:

## 2019-09-18 NOTE — Assessment & Plan Note (Signed)
>>  ASSESSMENT AND PLAN FOR DIASTOLIC HEART FAILURE (HCC) WRITTEN ON 09/18/2019  1:55 PM BY ALVA, RAKESH V, MD  - worsening breathing may be related to fluid or fibrosis in your lungs.  Increase torsemide to 40 mg in the morning and an extra 20 mg around 3 PM Refills will be sent in for potassium Goal would be weight loss of about 10 pounds in the next 2 weeks to see if your breathing improves  Revisit in 2 weeks and check be met

## 2019-09-23 DIAGNOSIS — G4733 Obstructive sleep apnea (adult) (pediatric): Secondary | ICD-10-CM | POA: Diagnosis not present

## 2019-09-25 ENCOUNTER — Other Ambulatory Visit: Payer: Self-pay

## 2019-09-25 ENCOUNTER — Encounter (INDEPENDENT_AMBULATORY_CARE_PROVIDER_SITE_OTHER): Payer: Self-pay | Admitting: Otolaryngology

## 2019-09-25 ENCOUNTER — Ambulatory Visit (INDEPENDENT_AMBULATORY_CARE_PROVIDER_SITE_OTHER): Payer: Medicare Other | Admitting: Otolaryngology

## 2019-09-25 VITALS — Temp 97.5°F

## 2019-09-25 DIAGNOSIS — K219 Gastro-esophageal reflux disease without esophagitis: Secondary | ICD-10-CM | POA: Diagnosis not present

## 2019-09-25 NOTE — Progress Notes (Signed)
HPI: Sharon Vasquez is a 69 y.o. female who presents is referred by Dr. Evette Doffing for evaluation of throat complaints.  Patient has had a long history of reflux disease and complains of chronic sensation of something in her throat.  She states that sometimes when she eats it cuts off her air and feels like food gets stuck.  She was previously seen in the ED in 2018 and was seen by Dr. Redmond Baseman with diagnosis of GE reflux disease.  At that time she had a popcorn kernel stuck in her throat.  She still complains of chronic sensation of stuff or mucus stuck in her throat and points to the area of the laryngeal cartilage.  She does smoke.  Denies any difficulty swallowing food except for occasionally.  She has had no weight loss..  Past Medical History:  Diagnosis Date  . A-fib ([place])   . Anxiety   . Arthritis    "qwhre; joints, back" (04/17/2017)  . Benign breast cyst in female, left 01/08/2017   Found by Screening mammogram, evaluated by [facility] on 01/08/17 and determined to be a benign simple breast cyst.  . Cellulitis of left lower leg 05/30/2017  . CHF (congestive heart failure) ([place])   . Chronic low back pain 08/21/2016  . Chronic lower back pain   . Chronic venous insufficiency    [name] 05/30/2017  . COPD (chronic obstructive pulmonary disease) ([place])   . Depression   . DVT (deep venous thrombosis) ([place]) 11/16/2016  . GERD (gastroesophageal reflux disease)   . Headache    "weekly for the last 3 months" (04/17/2017)  . Hyperlipidemia   . Hypertension   . Morbid obesity ([place])   . PE (pulmonary embolism)   . Pulmonary embolism ([place]) 09/21/2014   Past Surgical History:  Procedure Laterality Date  . ABDOMINAL HYSTERECTOMY    . APPENDECTOMY    . BREAST CYST EXCISION Left    "six o'clock"  . BREAST LUMPECTOMY Left   . CHOLECYSTECTOMY    . DILATION AND CURETTAGE OF UTERUS    . IR ABLATE LIVER CRYOABLATION  07/23/2019  . IR RADIOLOGIST EVAL & MGMT  07/18/2019  . TONSILLECTOMY AND ADENOIDECTOMY    .  TUBAL LIGATION     Social History   Socioeconomic History  . Marital status: Divorced    Spouse name: Not on file  . Number of children: Not on file  . Years of education: Not on file  . Highest education level: Not on file  Occupational History  . Not on file  Tobacco Use  . Smoking status: Current Some Day Smoker    Packs/day: 0.10    Years: 55.00    Pack years: 5.50    Types: Cigarettes    Start date: 08/16/1961  . Smokeless tobacco: Never Used  . Tobacco comment: 5 cigs per day  Vaping Use  . Vaping Use: Never used  Substance and Sexual Activity  . Alcohol use: No  . Drug use: No  . Sexual activity: Not Currently    Partners: Male  Other Topics Concern  . Not on file  Social History Narrative   Lives in [facility] senior complex in [place]. Lives alone, but is dependent in ADLs/IADLs. 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 Two daughters live nearby. Previously resided in [place].   Social Determinants of Health   Financial Resource Strain: Medium Risk  . Difficulty of Paying Living Expenses: Somewhat hard  Food  Insecurity: No Food Insecurity  . Worried About Charity fundraiser in the Last Year: Never true  . Ran Out of Food in the Last Year: Never true  Transportation Needs: No Transportation Needs  . Lack of Transportation (Medical): No  . Lack of Transportation (Non-Medical): No  Physical Activity:   . Days of Exercise per Week:   . Minutes of Exercise per Session:   Stress:   . Feeling of Stress :   Social Connections:   . Frequency of Communication with Friends and Family:   . Frequency of Social Gatherings with Friends and Family:   . Attends Religious Services:   . Active Member of Clubs or Organizations:   . Attends Archivist Meetings:   [name] Marital Status:    Family History  Problem Relation Age of Onset  . Breast cancer Mother   . Hypertension Mother   . Hyperlipidemia Mother   . Hypertension  Maternal Grandfather   . Hyperlipidemia Maternal Grandfather    No Known Allergies Prior to Admission medications   Medication Sig Start Date End Date Taking? Authorizing Provider  albuterol (PROAIR HFA) 108 (90 Base) MCG/ACT inhaler Inhale 2 puffs into the lungs every 6 (six) hours as needed for wheezing or shortness of breath. 08/21/19  Yes Neva Seat, MD  albuterol (PROVENTIL) (2.5 MG/3ML) 0.083% nebulizer solution TAKE 3 MLS(1 AMPULE) BY NEBULIZATION EVERY 4 HOURS AS NEEDED FOR [facility] OR SHORTNESS OF BREATH 01/20/19  Yes Neva Seat, MD  amiodarone (PACERONE) 200 MG tablet Take 0.5 tablets (100 mg total) by mouth daily. 08/29/18  Yes Neva Seat, MD  aspirin EC 81 MG tablet Take 81 mg by mouth daily.   Yes [provider]  atorvastatin (LIPITOR) 40 MG tablet Take 1 tablet (40 mg total) by mouth daily. 11/15/18  Yes Neva Seat, MD  benazepril (LOTENSIN) 40 MG tablet Take 1 tablet (40 mg total) by mouth daily. 06/05/19 06/04/20 Yes Neva Seat, MD  esomeprazole (NEXIUM) 40 MG capsule Take 1 capsule (40 mg total) by mouth daily as needed (for heartburn or indigestion). 01/20/19  Yes Neva Seat, MD  Fluticasone-Salmeterol (ADVAIR DISKUS) 250-50 MCG/DOSE AEPB Inhale 1 puff into the lungs 2 (two) times daily. Rinse mouth after each use 01/20/19  Yes Neva Seat, MD  gabapentin (NEURONTIN) 400 MG capsule Take 1 capsule (400 mg total) by mouth 2 (two) times daily. 08/21/19  Yes Neva Seat, MD  hydrALAZINE (APRESOLINE) 50 MG tablet Take 0.5 tablets (25 mg total) by mouth 3 (three) times daily. 06/05/19  Yes Neva Seat, MD  metoprolol succinate (TOPROL XL) 100 MG 24 hr tablet Take 1 tablet (100 mg total) by mouth daily. Take with or immediately following a meal. 06/30/19  Yes Neva Seat, MD  oxyCODONE-acetaminophen (PERCOCET/ROXICET) 5-325 MG tablet Take 1 tablet by mouth daily as needed for severe pain. 09/26/18  Yes Neva Seat, MD   potassium chloride (KLOR-CON) 10 MEQ tablet TAKE 2 TABLETS(20 MEQ) BY MOUTH TWICE DAILY 09/18/19  Yes Rigoberto Noel, MD  rivaroxaban (XARELTO) 20 MG TABS tablet Take 1 tablet (20 mg total) by mouth every morning. 09/26/18  Yes Neva Seat, MD  [facility] HANDIHALER 18 MCG inhalation capsule PLACE 1 CAPSULE INTO INHALER AND INHALE DAILY 01/10/19  Yes Martyn Ehrich, NP  torsemide (DEMADEX) 20 MG tablet TAKE 2 TABLETS BY MOUTH DAILY 03/21/19  Yes Lorretta Harp, MD     Positive ROS: Otherwise negative  All other systems have been reviewed and were otherwise  negative with the exception of those mentioned in the HPI and as above.  Physical Exam: Constitutional: Alert, well-appearing, no acute distress Ears: External ears without lesions or tenderness. Ear canals are clear bilaterally with intact, clear TMs.  Nasal: External nose without lesions. Septum midline with moderate rhinitis.. Clear nasal passages bilaterally with only clear mucus discharge bilaterally.  Both middle meatus regions were clear with no signs of infection.  No polyps noted intranasally. Oral: Lips and gums without lesions. Tongue and palate mucosa without lesions. Posterior oropharynx clear. Fiberoptic laryngoscopy was performed through the right nostril.  On fiberoptic laryngoscopy the nasopharynx was clear.  Base of tongue was clear.  She had a small vallecular cyst on the left side of the epiglottis.  Epiglottis was otherwise clear.  Vocal cords were clear bilaterally with normal vocal cord mobility.  She had moderate arytenoid edema consistent with laryngeal pharyngeal reflux.  No mucosal abnormalities noted. Neck: No palpable adenopathy or masses Respiratory: Breathing comfortably  Skin: No facial/neck lesions or rash noted.  Laryngoscopy  Date/Time: 09/25/2019 3:30 PM Performed by: Rozetta Nunnery, MD Authorized by: Rozetta Nunnery, MD   Consent:    Consent obtained:  Verbal   Consent given by:   Patient Procedure details:    Medication:  Afrin   Instrument: flexible fiberoptic laryngoscope     Scope location: right nare   Sinus:    Right nasopharynx: normal   Mouth:    Oropharynx: normal     Vallecula: normal     Base of tongue: normal     Epiglottis: normal   Throat:    Right hypopharynx: normal     Left hypopharynx: normal     Pyriform sinus: normal     True vocal cords: normal   Comments:     On fiberoptic laryngoscopy through the right nostril the hypopharynx and larynx were normal.  Vocal cords were clear bilaterally with normal vocal mobility.  Of note patient had a small vallecular cyst on the lingual surface of epiglottis on the left side.  She had moderate edema of the arytenoid mucosa consistent with laryngeal pharyngeal reflux.  But no mucosal adenopathy is noted otherwise.    Assessment: Normal laryngeal examination on fiberoptic laryngoscopy. Findings consistent with laryngeal pharyngeal reflux.  Plan: Reassured patient of normal laryngeal examination and hypopharyngeal examination. Recommended treatment for laryngeal pharyngeal reflux.  She is presently receiving this.   Radene Journey, MD   CC:

## 2019-09-29 ENCOUNTER — Emergency Department (HOSPITAL_COMMUNITY): Payer: Medicare Other

## 2019-09-29 ENCOUNTER — Telehealth: Payer: Self-pay | Admitting: *Deleted

## 2019-09-29 ENCOUNTER — Encounter (HOSPITAL_COMMUNITY): Payer: Self-pay

## 2019-09-29 ENCOUNTER — Inpatient Hospital Stay (HOSPITAL_COMMUNITY)
Admission: EM | Admit: 2019-09-29 | Discharge: 2019-10-02 | DRG: 291 | Disposition: A | Discharge status: Home / Self Care | Payer: Medicare Other | Attending: Internal Medicine | Admitting: Internal Medicine

## 2019-09-29 DIAGNOSIS — Z79899 Other long term (current) drug therapy: Secondary | ICD-10-CM

## 2019-09-29 DIAGNOSIS — J9611 Chronic respiratory failure with hypoxia: Secondary | ICD-10-CM | POA: Diagnosis present

## 2019-09-29 DIAGNOSIS — E873 Alkalosis: Secondary | ICD-10-CM | POA: Diagnosis not present

## 2019-09-29 DIAGNOSIS — J9601 Acute respiratory failure with hypoxia: Secondary | ICD-10-CM | POA: Diagnosis not present

## 2019-09-29 DIAGNOSIS — Z9862 Peripheral vascular angioplasty status: Secondary | ICD-10-CM

## 2019-09-29 DIAGNOSIS — I739 Peripheral vascular disease, unspecified: Secondary | ICD-10-CM | POA: Diagnosis present

## 2019-09-29 DIAGNOSIS — Z8249 Family history of ischemic heart disease and other diseases of the circulatory system: Secondary | ICD-10-CM

## 2019-09-29 DIAGNOSIS — Z743 Need for continuous supervision: Secondary | ICD-10-CM | POA: Diagnosis not present

## 2019-09-29 DIAGNOSIS — I160 Hypertensive urgency: Secondary | ICD-10-CM | POA: Diagnosis not present

## 2019-09-29 DIAGNOSIS — Z90710 Acquired absence of both cervix and uterus: Secondary | ICD-10-CM

## 2019-09-29 DIAGNOSIS — K219 Gastro-esophageal reflux disease without esophagitis: Secondary | ICD-10-CM | POA: Diagnosis present

## 2019-09-29 DIAGNOSIS — E785 Hyperlipidemia, unspecified: Secondary | ICD-10-CM | POA: Diagnosis not present

## 2019-09-29 DIAGNOSIS — Z86711 Personal history of pulmonary embolism: Secondary | ICD-10-CM

## 2019-09-29 DIAGNOSIS — G4489 Other headache syndrome: Secondary | ICD-10-CM | POA: Diagnosis not present

## 2019-09-29 DIAGNOSIS — J849 Interstitial pulmonary disease, unspecified: Secondary | ICD-10-CM | POA: Diagnosis not present

## 2019-09-29 DIAGNOSIS — I872 Venous insufficiency (chronic) (peripheral): Secondary | ICD-10-CM | POA: Diagnosis not present

## 2019-09-29 DIAGNOSIS — Z20822 Contact with and (suspected) exposure to covid-19: Secondary | ICD-10-CM | POA: Diagnosis not present

## 2019-09-29 DIAGNOSIS — Z7951 Long term (current) use of inhaled steroids: Secondary | ICD-10-CM

## 2019-09-29 DIAGNOSIS — Z86718 Personal history of other venous thrombosis and embolism: Secondary | ICD-10-CM

## 2019-09-29 DIAGNOSIS — I130 Hypertensive heart and chronic kidney disease with heart failure and stage 1 through stage 4 chronic kidney disease, or unspecified chronic kidney disease: Secondary | ICD-10-CM | POA: Diagnosis not present

## 2019-09-29 DIAGNOSIS — I509 Heart failure, unspecified: Secondary | ICD-10-CM

## 2019-09-29 DIAGNOSIS — I4891 Unspecified atrial fibrillation: Secondary | ICD-10-CM | POA: Diagnosis present

## 2019-09-29 DIAGNOSIS — J449 Chronic obstructive pulmonary disease, unspecified: Secondary | ICD-10-CM | POA: Diagnosis not present

## 2019-09-29 DIAGNOSIS — R0602 Shortness of breath: Secondary | ICD-10-CM

## 2019-09-29 DIAGNOSIS — I480 Paroxysmal atrial fibrillation: Secondary | ICD-10-CM | POA: Diagnosis not present

## 2019-09-29 DIAGNOSIS — N189 Chronic kidney disease, unspecified: Secondary | ICD-10-CM | POA: Diagnosis not present

## 2019-09-29 DIAGNOSIS — I5033 Acute on chronic diastolic (congestive) heart failure: Secondary | ICD-10-CM | POA: Diagnosis not present

## 2019-09-29 DIAGNOSIS — R1110 Vomiting, unspecified: Secondary | ICD-10-CM | POA: Diagnosis not present

## 2019-09-29 DIAGNOSIS — N3946 Mixed incontinence: Secondary | ICD-10-CM | POA: Diagnosis present

## 2019-09-29 DIAGNOSIS — Z79891 Long term (current) use of opiate analgesic: Secondary | ICD-10-CM

## 2019-09-29 DIAGNOSIS — R6889 Other general symptoms and signs: Secondary | ICD-10-CM | POA: Diagnosis not present

## 2019-09-29 DIAGNOSIS — I2510 Atherosclerotic heart disease of native coronary artery without angina pectoris: Secondary | ICD-10-CM | POA: Diagnosis not present

## 2019-09-29 DIAGNOSIS — Z7901 Long term (current) use of anticoagulants: Secondary | ICD-10-CM | POA: Diagnosis not present

## 2019-09-29 DIAGNOSIS — Z83438 Family history of other disorder of lipoprotein metabolism and other lipidemia: Secondary | ICD-10-CM

## 2019-09-29 DIAGNOSIS — F17210 Nicotine dependence, cigarettes, uncomplicated: Secondary | ICD-10-CM | POA: Diagnosis present

## 2019-09-29 DIAGNOSIS — I358 Other nonrheumatic aortic valve disorders: Secondary | ICD-10-CM | POA: Diagnosis present

## 2019-09-29 DIAGNOSIS — Z9114 Patient's other noncompliance with medication regimen: Secondary | ICD-10-CM

## 2019-09-29 DIAGNOSIS — Z803 Family history of malignant neoplasm of breast: Secondary | ICD-10-CM

## 2019-09-29 DIAGNOSIS — R32 Unspecified urinary incontinence: Secondary | ICD-10-CM | POA: Diagnosis present

## 2019-09-29 DIAGNOSIS — I6381 Other cerebral infarction due to occlusion or stenosis of small artery: Secondary | ICD-10-CM | POA: Diagnosis not present

## 2019-09-29 DIAGNOSIS — G894 Chronic pain syndrome: Secondary | ICD-10-CM

## 2019-09-29 DIAGNOSIS — Z9119 Patient's noncompliance with other medical treatment and regimen: Secondary | ICD-10-CM

## 2019-09-29 DIAGNOSIS — G4733 Obstructive sleep apnea (adult) (pediatric): Secondary | ICD-10-CM | POA: Diagnosis not present

## 2019-09-29 DIAGNOSIS — Z7982 Long term (current) use of aspirin: Secondary | ICD-10-CM

## 2019-09-29 DIAGNOSIS — Z6841 Body Mass Index (BMI) 40.0 and over, adult: Secondary | ICD-10-CM

## 2019-09-29 DIAGNOSIS — R52 Pain, unspecified: Secondary | ICD-10-CM | POA: Diagnosis not present

## 2019-09-29 LAB — CBC WITH DIFFERENTIAL/PLATELET
Abs Immature Granulocytes: 0.02 10*3/uL (ref 0.00–0.07)
Basophils Absolute: 0 10*3/uL (ref 0.0–0.1)
Basophils Relative: 0 %
Eosinophils Absolute: 0.1 10*3/uL (ref 0.0–0.5)
Eosinophils Relative: 2 %
HCT: 37.2 % (ref 36.0–46.0)
Hemoglobin: 12.2 g/dL (ref 12.0–15.0)
Immature Granulocytes: 0 %
Lymphocytes Relative: 34 %
Lymphs Abs: 2.6 10*3/uL (ref 0.7–4.0)
MCH: 29.4 pg (ref 26.0–34.0)
MCHC: 32.8 g/dL (ref 30.0–36.0)
MCV: 89.6 fL (ref 80.0–100.0)
Monocytes Absolute: 0.5 10*3/uL (ref 0.1–1.0)
Monocytes Relative: 6 %
Neutro Abs: 4.5 10*3/uL (ref 1.7–7.7)
Neutrophils Relative %: 58 %
Platelets: 351 10*3/uL (ref 150–400)
RBC: 4.15 MIL/uL (ref 3.87–5.11)
RDW: 14.3 % (ref 11.5–15.5)
WBC: 7.7 10*3/uL (ref 4.0–10.5)
nRBC: 0 % (ref 0.0–0.2)

## 2019-09-29 MED ORDER — HYDRALAZINE HCL 20 MG/ML IJ SOLN
10.0000 mg | Freq: Once | INTRAMUSCULAR | Status: AC
  Administered 2019-09-29: 10 mg via INTRAVENOUS
  Filled 2019-09-29: qty 1

## 2019-09-29 NOTE — Telephone Encounter (Signed)
Pt calls and states she is taking her K+ but doesn't drink much fluids. States she is having cramps all over her body. She is offered an appt, she states she might not be able to get a ride, appt is made for [facility] wed 6/30 at 1315. But she may not come.

## 2019-09-29 NOTE — ED Provider Notes (Signed)
[facility] EMERGENCY DEPARTMENT Provider Note   CSN: [id number] Arrival date & time: 09/29/19  2253     History Chief Complaint  Patient presents with  . Hypertension  . Headache    Sharon Vasquez is a 69 y.o. female.  HPI     This is a 69 year old female with a history of atrial fibrillation, CHF, chronic renal insufficiency, COPD, hypertension, hyperlipidemia, morbid obesity who presents with headache and shortness of breath.  Patient reports she has had symptoms over the last day worsening over the last 2 hours.  She took her blood pressure at home and the diastolic reading would not read.  She states her top number was in the [facility].  She took an extra dose of hydralazine.  She otherwise reports compliance with her blood pressure medication.  Regarding her shortness of breath, she denies cough or fever.  She does report orthopnea and chest tightness.  Reports ongoing lower extremity swelling that is not worse than prior.  Patient also reports headache.  She rates her pain 8 out of 10.  She describes it as throbbing and behind her eyes.  No known history of headache.  Denies any weakness, numbness, tingling, strokelike symptoms.  Past Medical History:  Diagnosis Date  . A-fib ([place])   . Anxiety   . Arthritis    "qwhre; joints, back" (04/17/2017)  . Benign breast cyst in female, left 01/08/2017   Found by Screening mammogram, evaluated by [facility] on 01/08/17 and determined to be a benign simple breast cyst.  . Cellulitis of left lower leg 05/30/2017  . CHF (congestive heart failure) ([place])   . Chronic low back pain 08/21/2016  . Chronic lower back pain   . Chronic venous insufficiency    [name] 05/30/2017  . COPD (chronic obstructive pulmonary disease) ([place])   . Depression   . DVT (deep venous thrombosis) ([place]) 11/16/2016  . GERD (gastroesophageal reflux disease)   . Headache    "weekly for the last 3 months" (04/17/2017)  . Hyperlipidemia   . Hypertension   .  Morbid obesity ([place])   . PE (pulmonary embolism)   . Pulmonary embolism ([place]) 09/21/2014    Patient Active Problem List   Diagnosis Date Noted  . Mixed incontinence, urge and stress (female) (female) 08/21/2019  . Overgrown toenails 07/01/2019  . Preventative health care 06/04/2019  . GERD (gastroesophageal reflux disease) 01/20/2019  . Coronary artery calcification seen on CT scan 04/12/2018  . ILD (interstitial lung disease) ([place]) 02/13/2018  . Pain and numbness of right upper extremity 02/08/2018  . Long term current use of amiodarone 12/19/2017  . OSA (obstructive sleep apnea) 11/14/2017  . Chronic pain syndrome 07/27/2017  . Major depression, recurrent, chronic ([place]) 02/01/2017  . Osteoporosis 12/14/2016  . Drug induced constipation 08/21/2016  . Elevated blood sugar 07/19/2016  . Aortic atherosclerosis ([place]) 07/17/2016  . Chronic venous insufficiency 07/12/2016  . Chronic anticoagulation 07/12/2016  . Tobacco use 07/11/2016  . COPD (chronic obstructive pulmonary disease) ([place])   . (HFpEF) heart failure with preserved ejection fraction ([place])   . History of pulmonary embolism   . PAF (paroxysmal atrial fibrillation) ([place])   . Vocal cord polyp 12/18/2015  . Laryngopharyngeal reflux 12/18/2015  . Morbid obesity ([place])   . Peripheral vascular disease ([place])   . Benign essential HTN   . Primary osteoarthritis of both knees 09/16/2014    Past Surgical History:  Procedure Laterality Date  . ABDOMINAL HYSTERECTOMY    .  APPENDECTOMY    . BREAST CYST EXCISION Left    "six o'clock"  . BREAST LUMPECTOMY Left   . CHOLECYSTECTOMY    . DILATION AND CURETTAGE OF UTERUS    . IR ABLATE LIVER CRYOABLATION  07/23/2019  . IR RADIOLOGIST EVAL & MGMT  07/18/2019  . TONSILLECTOMY AND ADENOIDECTOMY    . TUBAL LIGATION       OB History   No obstetric history on file.     Family History  Problem Relation Age of Onset  . Breast cancer Mother   . Hypertension Mother   . Hyperlipidemia  Mother   . Hypertension Maternal Grandfather   . Hyperlipidemia Maternal Grandfather     Social History   Tobacco Use  . Smoking status: Current Some Day Smoker    Packs/day: 0.10    Years: 55.00    Pack years: 5.50    Types: Cigarettes    Start date: 08/16/1961  . Smokeless tobacco: Never Used  . Tobacco comment: 5 cigs per day  Vaping Use  . Vaping Use: Never used  Substance Use Topics  . Alcohol use: No  . Drug use: No    Home Medications Prior to Admission medications   Medication Sig Start Date End Date Taking? Authorizing Provider  albuterol (PROAIR HFA) 108 (90 Base) MCG/ACT inhaler Inhale 2 puffs into the lungs every 6 (six) hours as needed for wheezing or shortness of breath. 08/21/19   Neva Seat, MD  albuterol (PROVENTIL) (2.5 MG/3ML) 0.083% nebulizer solution TAKE 3 MLS(1 AMPULE) BY NEBULIZATION EVERY 4 HOURS AS NEEDED FOR [facility] OR SHORTNESS OF BREATH 01/20/19   Neva Seat, MD  amiodarone (PACERONE) 200 MG tablet Take 0.5 tablets (100 mg total) by mouth daily. 08/29/18   Neva Seat, MD  aspirin EC 81 MG tablet Take 81 mg by mouth daily.    [provider]  atorvastatin (LIPITOR) 40 MG tablet Take 1 tablet (40 mg total) by mouth daily. 11/15/18   Neva Seat, MD  benazepril (LOTENSIN) 40 MG tablet Take 1 tablet (40 mg total) by mouth daily. 06/05/19 06/04/20  Neva Seat, MD  esomeprazole (NEXIUM) 40 MG capsule Take 1 capsule (40 mg total) by mouth daily as needed (for heartburn or indigestion). 01/20/19   Neva Seat, MD  Fluticasone-Salmeterol (ADVAIR DISKUS) 250-50 MCG/DOSE AEPB Inhale 1 puff into the lungs 2 (two) times daily. Rinse mouth after each use 01/20/19   Neva Seat, MD  gabapentin (NEURONTIN) 400 MG capsule Take 1 capsule (400 mg total) by mouth 2 (two) times daily. 08/21/19   Neva Seat, MD  hydrALAZINE (APRESOLINE) 50 MG tablet Take 0.5 tablets (25 mg total) by mouth 3 (three) times daily. 06/05/19    Neva Seat, MD  metoprolol succinate (TOPROL XL) 100 MG 24 hr tablet Take 1 tablet (100 mg total) by mouth daily. Take with or immediately following a meal. 06/30/19   Neva Seat, MD  oxyCODONE-acetaminophen (PERCOCET/ROXICET) 5-325 MG tablet Take 1 tablet by mouth daily as needed for severe pain. 09/26/18   Neva Seat, MD  potassium chloride (KLOR-CON) 10 MEQ tablet TAKE 2 TABLETS(20 MEQ) BY MOUTH TWICE DAILY 09/18/19   Rigoberto Noel, MD  rivaroxaban (XARELTO) 20 MG TABS tablet Take 1 tablet (20 mg total) by mouth every morning. 09/26/18   Neva Seat, MD  [facility] HANDIHALER 18 MCG inhalation capsule PLACE 1 CAPSULE INTO INHALER AND INHALE DAILY 01/10/19   Martyn Ehrich, NP  torsemide (DEMADEX) 20 MG tablet TAKE 2 TABLETS BY  MOUTH DAILY 03/21/19   Lorretta Harp, MD    Allergies    Patient has no known allergies.  Review of Systems   Review of Systems  Constitutional: Negative for fever.  Respiratory: Positive for chest tightness and shortness of breath. Negative for cough.   Cardiovascular: Positive for leg swelling. Negative for chest pain.  Gastrointestinal: Negative for abdominal pain, nausea and vomiting.  Neurological: Positive for headaches. Negative for weakness and numbness.  All other systems reviewed and are negative.   Physical Exam Updated Vital Signs BP (!) 124/101   Pulse 67   Temp 98.6 F (37 C) (Oral)   Resp (!) 23   Ht 1.88 m (6\' 2" )   Wt (!) 172.4 kg   SpO2 100%   BMI 48.79 kg/m   Physical Exam Vitals and nursing note reviewed.  Constitutional:      Appearance: She is well-developed.     Comments: Morbidly obese, nontoxic-appearing  HENT:     Head: Normocephalic and atraumatic.     Mouth/Throat:     Mouth: Mucous membranes are moist.  Eyes:     Pupils: Pupils are equal, round, and reactive to light.  Cardiovascular:     Rate and Rhythm: Normal rate and regular rhythm.     Heart sounds: Normal heart sounds.      Comments: Limited secondary to body habitus Pulmonary:     Breath sounds: Wheezing present.     Comments: Poor air movement, expiratory wheezing noted, patient noted to stop multiple times midsentence to catch her breath Abdominal:     General: Bowel sounds are normal.     Palpations: Abdomen is soft.  Musculoskeletal:     Cervical back: Neck supple.     Right lower leg: Edema present.     Left lower leg: Edema present.     Comments: 2+ pitting edema bilateral lower extremities  Skin:    General: Skin is warm and dry.     Comments: Bilateral lower extremity venous stasis changes  Neurological:     Mental Status: She is alert and oriented to person, place, and time.     Comments: Cranial nerves II through XII intact, 5 out of strength in all 4 extremities  Psychiatric:        Mood and Affect: Mood normal.     ED Results / Procedures / Treatments   Labs (all labs ordered are listed, but only abnormal results are displayed) Labs Reviewed  COMPREHENSIVE METABOLIC PANEL - Abnormal; Notable for the following components:      Result Value   Glucose, Bld 105 (*)    Creatinine, Ser 1.14 (*)    GFR calc non Af Amer 49 (*)    GFR calc Af Amer 56 (*)    All other components within normal limits  BRAIN NATRIURETIC PEPTIDE - Abnormal; Notable for the following components:   B Natriuretic Peptide 146.1 (*)    All other components within normal limits  SARS CORONAVIRUS 2 BY RT PCR (HOSPITAL ORDER, [place] [facility])  CBC WITH DIFFERENTIAL/PLATELET  I-STAT ARTERIAL BLOOD GAS, ED  TROPONIN I (HIGH SENSITIVITY)  TROPONIN I (HIGH SENSITIVITY)    EKG EKG Interpretation  Date/Time:  Monday September 29 2019 22:58:41 EDT Ventricular Rate:  77 PR Interval:    QRS Duration: 110 QT Interval:  421 QTC Calculation: 477 R Axis:   -34 Text Interpretation: Sinus rhythm LVH with secondary repolarization abnormality Anterior Q waves, possibly due to LVH No  STEMI Confirmed by  Thayer Jew [phone]) on 09/29/2019 11:18:26 PM   Radiology DG Chest 2 View  Result Date: 09/30/2019 CLINICAL DATA:  Shortness of breath EXAM: CHEST - 2 VIEW COMPARISON:  06/29/2019 FINDINGS: Frontal and lateral views of the chest demonstrate stable enlargement of the cardiac silhouette. Chronic central vascular congestion and interstitial scarring again noted. No airspace disease, effusion, or pneumothorax. No acute bony abnormalities. IMPRESSION: 1. Stable chronic central vascular congestion and interstitial scarring. No acute process. Electronically Signed   By: Randa Ngo M.D.   On: 09/30/2019 00:18   CT Head Wo Contrast  Result Date: 09/30/2019 CLINICAL DATA:  Acute headache, hypertension, atrial fibrillation EXAM: CT HEAD WITHOUT CONTRAST TECHNIQUE: Contiguous axial images were obtained from the base of the skull through the vertex without intravenous contrast. COMPARISON:  06/30/2019 FINDINGS: Brain: Chronic lacunar infarcts are seen within the bilateral basal ganglia and periventricular white matter, stable. No acute infarct or hemorrhage. Lateral ventricles and remaining midline structures are unremarkable. No acute extra-axial fluid collections. No mass effect. Vascular: No hyperdense vessel or unexpected calcification. Skull: Normal. Negative for fracture or focal lesion. Sinuses/Orbits: No acute finding. Other: None. IMPRESSION: 1. Stable chronic small vessel ischemic changes. No acute intracranial process. Electronically Signed   By: Randa Ngo M.D.   On: 09/30/2019 00:03    Procedures Procedures (including critical care time)  CRITICAL CARE Performed by: Merryl Hacker   Total critical care time: 45 minutes  Critical care time was exclusive of separately billable procedures and treating other patients.  Critical care was necessary to treat or prevent imminent or life-threatening deterioration.  Critical care was time spent personally by me on the following  activities: development of treatment plan with patient and/or surrogate as well as nursing, discussions with consultants, evaluation of patient's response to treatment, examination of patient, obtaining history from patient or surrogate, ordering and performing treatments and interventions, ordering and review of laboratory studies, ordering and review of radiographic studies, pulse oximetry and re-evaluation of patient's condition.   Medications Ordered in ED Medications  hydrALAZINE (APRESOLINE) injection 10 mg (10 mg Intravenous Given 09/29/19 2341)  albuterol (VENTOLIN HFA) 108 (90 Base) MCG/ACT inhaler 6 puff (6 puffs Inhalation Given 09/30/19 0037)  furosemide (LASIX) injection 80 mg (80 mg Intravenous Given 09/30/19 0116)  nitroGLYCERIN (NITROGLYN) 2 % ointment 1 inch (1 inch Topical Given 09/30/19 0118)    ED Course  I have reviewed the triage vital signs and the nursing notes.  Pertinent labs & imaging results that were available during my care of the patient were reviewed by me and considered in my medical decision making (see chart for details).  Clinical Course as of Sep 30 231  Tue Sep 30, 2019  0227 On repeat evaluation.  Much improved on BiPAP.  Clinically she states she feels much better.  Blood pressure now is 124/101.  Suspect hypertensive urgency and pulmonary edema causing the patient's symptoms.  We will plan for admission for diuresis and stabilization.   [CH]    Clinical Course User Index [CH] Leston Schueller, Barbette Hair, MD   MDM Rules/Calculators/A&P                           Patient presents with shortness of breath and headache.  Reports high blood pressure at home.  She is overall chronically ill-appearing.  Mildly tachypneic with speaking in short sentences.  Exam is fairly limited secondary to body habitus.  She does  appear somewhat volume overloaded.  Considerations include but not limited to, pulmonary edema, COPD, hypertensive urgency or emergency.  Regarding her  headache, she is neurologically intact.  Again could be related to hypertension.  Other considerations include intracranial hemorrhage.  Work-up initiated.  Chest x-ray independently reviewed by myself.  Stable vascular congestion noted without significant pleural effusions.  EKG without acute ischemic changes but do show signs of prolonged high blood pressure.  Patient was given 1 dose of IV hydralazine initially.  Initial troponin is negative.  Doubt primary ACS.  BNP is slightly elevated and likely falsely low given patient's BMI.  Slight elevation in creatinine but otherwise CMP at baseline.  Patient was given 80 mg of IV Lasix for presumed pulmonary edema.  Patient had some increasing shortness of breath and respiratory distress.  She was placed on BiPAP for this reason for work of breathing.  She refused ABG.  See clinical course above.  Nitropaste was also applied given continued elevated blood pressures.  On recheck, patient appears much more comfortable on BiPAP.  Blood pressure has down trended to 124/101.  She is receiving Lasix and diuresing.  Overall clinical picture most consistent with hypertensive urgency and some pulmonary edema.  We will plan for admission for blood pressure control, diuresis, and supporting respiratory status.  Final Clinical Impression(s) / ED Diagnoses Final diagnoses:  SOB (shortness of breath)  Hypertensive urgency    Rx / DC Orders ED Discharge Orders    None       Walton Digilio, Barbette Hair, MD 09/30/19 [phone]

## 2019-09-29 NOTE — ED Notes (Signed)
Pt transported to Radiology 

## 2019-09-29 NOTE — Telephone Encounter (Signed)
Sounds reasonable, thanks.

## 2019-09-29 NOTE — ED Triage Notes (Signed)
Pt arrives via [facility] from home  Pt complaining of htn episode at home. SBP 240 with EMS.  Pt also endorses a 8/10 throbbing HA. A&Ox4 on arrival.   Pt reports taking 2 hydralazine today.

## 2019-09-30 ENCOUNTER — Observation Stay (HOSPITAL_COMMUNITY): Payer: Medicare Other

## 2019-09-30 ENCOUNTER — Other Ambulatory Visit: Payer: Self-pay

## 2019-09-30 ENCOUNTER — Emergency Department (HOSPITAL_COMMUNITY): Payer: Medicare Other

## 2019-09-30 DIAGNOSIS — Z7901 Long term (current) use of anticoagulants: Secondary | ICD-10-CM | POA: Diagnosis not present

## 2019-09-30 DIAGNOSIS — R0602 Shortness of breath: Secondary | ICD-10-CM | POA: Diagnosis not present

## 2019-09-30 DIAGNOSIS — I739 Peripheral vascular disease, unspecified: Secondary | ICD-10-CM | POA: Diagnosis present

## 2019-09-30 DIAGNOSIS — N189 Chronic kidney disease, unspecified: Secondary | ICD-10-CM | POA: Diagnosis present

## 2019-09-30 DIAGNOSIS — I130 Hypertensive heart and chronic kidney disease with heart failure and stage 1 through stage 4 chronic kidney disease, or unspecified chronic kidney disease: Secondary | ICD-10-CM | POA: Diagnosis present

## 2019-09-30 DIAGNOSIS — I160 Hypertensive urgency: Secondary | ICD-10-CM | POA: Diagnosis present

## 2019-09-30 DIAGNOSIS — F17210 Nicotine dependence, cigarettes, uncomplicated: Secondary | ICD-10-CM | POA: Diagnosis present

## 2019-09-30 DIAGNOSIS — E785 Hyperlipidemia, unspecified: Secondary | ICD-10-CM | POA: Diagnosis present

## 2019-09-30 DIAGNOSIS — I2510 Atherosclerotic heart disease of native coronary artery without angina pectoris: Secondary | ICD-10-CM | POA: Diagnosis present

## 2019-09-30 DIAGNOSIS — I5033 Acute on chronic diastolic (congestive) heart failure: Secondary | ICD-10-CM

## 2019-09-30 DIAGNOSIS — J9611 Chronic respiratory failure with hypoxia: Secondary | ICD-10-CM | POA: Diagnosis present

## 2019-09-30 DIAGNOSIS — I872 Venous insufficiency (chronic) (peripheral): Secondary | ICD-10-CM | POA: Diagnosis present

## 2019-09-30 DIAGNOSIS — Z86711 Personal history of pulmonary embolism: Secondary | ICD-10-CM | POA: Diagnosis not present

## 2019-09-30 DIAGNOSIS — I358 Other nonrheumatic aortic valve disorders: Secondary | ICD-10-CM | POA: Diagnosis present

## 2019-09-30 DIAGNOSIS — K219 Gastro-esophageal reflux disease without esophagitis: Secondary | ICD-10-CM | POA: Diagnosis present

## 2019-09-30 DIAGNOSIS — J9601 Acute respiratory failure with hypoxia: Secondary | ICD-10-CM | POA: Diagnosis present

## 2019-09-30 DIAGNOSIS — G4733 Obstructive sleep apnea (adult) (pediatric): Secondary | ICD-10-CM | POA: Diagnosis present

## 2019-09-30 DIAGNOSIS — I509 Heart failure, unspecified: Secondary | ICD-10-CM

## 2019-09-30 DIAGNOSIS — I6381 Other cerebral infarction due to occlusion or stenosis of small artery: Secondary | ICD-10-CM | POA: Diagnosis not present

## 2019-09-30 DIAGNOSIS — Z20822 Contact with and (suspected) exposure to covid-19: Secondary | ICD-10-CM | POA: Diagnosis present

## 2019-09-30 DIAGNOSIS — J849 Interstitial pulmonary disease, unspecified: Secondary | ICD-10-CM | POA: Diagnosis present

## 2019-09-30 DIAGNOSIS — Z6841 Body Mass Index (BMI) 40.0 and over, adult: Secondary | ICD-10-CM | POA: Diagnosis not present

## 2019-09-30 DIAGNOSIS — J449 Chronic obstructive pulmonary disease, unspecified: Secondary | ICD-10-CM | POA: Diagnosis present

## 2019-09-30 DIAGNOSIS — N3946 Mixed incontinence: Secondary | ICD-10-CM | POA: Diagnosis present

## 2019-09-30 DIAGNOSIS — I480 Paroxysmal atrial fibrillation: Secondary | ICD-10-CM | POA: Diagnosis present

## 2019-09-30 DIAGNOSIS — E873 Alkalosis: Secondary | ICD-10-CM | POA: Diagnosis present

## 2019-09-30 DIAGNOSIS — Z9862 Peripheral vascular angioplasty status: Secondary | ICD-10-CM | POA: Diagnosis not present

## 2019-09-30 LAB — COMPREHENSIVE METABOLIC PANEL
ALT: 21 U/L (ref 0–44)
AST: 15 U/L (ref 15–41)
Albumin: 3.5 g/dL (ref 3.5–5.0)
Alkaline Phosphatase: 83 U/L (ref 38–126)
Anion gap: 7 (ref 5–15)
BUN: 15 mg/dL (ref 8–23)
CO2: 26 mmol/L (ref 22–32)
Calcium: 9 mg/dL (ref 8.9–10.3)
Chloride: 108 mmol/L (ref 98–111)
Creatinine, Ser: 1.14 mg/dL — ABNORMAL HIGH (ref 0.44–1.00)
GFR calc Af Amer: 56 mL/min — ABNORMAL LOW (ref >60)
GFR calc non Af Amer: 49 mL/min — ABNORMAL LOW (ref >60)
Glucose, Bld: 105 mg/dL — ABNORMAL HIGH (ref 70–99)
Potassium: 3.9 mmol/L (ref 3.5–5.1)
Sodium: 141 mmol/L (ref 135–145)
Total Bilirubin: 0.5 mg/dL (ref 0.3–1.2)
Total Protein: 6.9 g/dL (ref 6.5–8.1)

## 2019-09-30 LAB — I-STAT VENOUS BLOOD GAS, ED
Acid-Base Excess: 3 mmol/L — ABNORMAL HIGH (ref 0.0–2.0)
Bicarbonate: 26.9 mmol/L (ref 20.0–28.0)
Calcium, Ion: 1.06 mmol/L — ABNORMAL LOW (ref 1.15–1.40)
HCT: 32 % — ABNORMAL LOW (ref 36.0–46.0)
Hemoglobin: 10.9 g/dL — ABNORMAL LOW (ref 12.0–15.0)
O2 Saturation: 100 %
Potassium: 3.8 mmol/L (ref 3.5–5.1)
Sodium: 141 mmol/L (ref 135–145)
TCO2: 28 mmol/L (ref 22–32)
pCO2, Ven: 36.9 mmHg — ABNORMAL LOW (ref 44.0–60.0)
pH, Ven: 7.47 — ABNORMAL HIGH (ref 7.250–7.430)
pO2, Ven: 193 mmHg — ABNORMAL HIGH (ref 32.0–45.0)

## 2019-09-30 LAB — BASIC METABOLIC PANEL
Anion gap: 10 (ref 5–15)
BUN: 17 mg/dL (ref 8–23)
CO2: 28 mmol/L (ref 22–32)
Calcium: 9 mg/dL (ref 8.9–10.3)
Chloride: 102 mmol/L (ref 98–111)
Creatinine, Ser: 1.11 mg/dL — ABNORMAL HIGH (ref 0.44–1.00)
GFR calc Af Amer: 58 mL/min — ABNORMAL LOW (ref >60)
GFR calc non Af Amer: 50 mL/min — ABNORMAL LOW (ref >60)
Glucose, Bld: 101 mg/dL — ABNORMAL HIGH (ref 70–99)
Potassium: 3.5 mmol/L (ref 3.5–5.1)
Sodium: 140 mmol/L (ref 135–145)

## 2019-09-30 LAB — BRAIN NATRIURETIC PEPTIDE: B Natriuretic Peptide: 146.1 pg/mL — ABNORMAL HIGH (ref 0.0–100.0)

## 2019-09-30 LAB — ECHOCARDIOGRAM COMPLETE
Height: 74 in
Weight: 6080 oz

## 2019-09-30 LAB — SARS CORONAVIRUS 2 BY RT PCR (HOSPITAL ORDER, PERFORMED IN ~~LOC~~ HOSPITAL LAB): SARS Coronavirus 2: NEGATIVE

## 2019-09-30 LAB — HIV ANTIBODY (ROUTINE TESTING W REFLEX): HIV Screen 4th Generation wRfx: NONREACTIVE

## 2019-09-30 LAB — TROPONIN I (HIGH SENSITIVITY)
Troponin I (High Sensitivity): 7 ng/L (ref <18)
Troponin I (High Sensitivity): 8 ng/L (ref <18)

## 2019-09-30 MED ORDER — ATORVASTATIN CALCIUM 40 MG PO TABS
40.0000 mg | ORAL_TABLET | Freq: Every day | ORAL | Status: DC
  Administered 2019-09-30 – 2019-10-02 (×3): 40 mg via ORAL
  Filled 2019-09-30 (×3): qty 1

## 2019-09-30 MED ORDER — FUROSEMIDE 10 MG/ML IJ SOLN
80.0000 mg | Freq: Two times a day (BID) | INTRAMUSCULAR | Status: DC
  Administered 2019-09-30: 80 mg via INTRAVENOUS
  Filled 2019-09-30: qty 8

## 2019-09-30 MED ORDER — FUROSEMIDE 10 MG/ML IJ SOLN: 80.0000 mg | Freq: Every day | INTRAMUSCULAR | Status: DC

## 2019-09-30 MED ORDER — ALBUTEROL SULFATE HFA 108 (90 BASE) MCG/ACT IN AERS
6.0000 | INHALATION_SPRAY | Freq: Once | RESPIRATORY_TRACT | Status: AC
  Administered 2019-09-30: 6 via RESPIRATORY_TRACT
  Filled 2019-09-30: qty 6.7

## 2019-09-30 MED ORDER — ASPIRIN EC 81 MG PO TBEC
81.0000 mg | DELAYED_RELEASE_TABLET | Freq: Every day | ORAL | Status: DC
  Administered 2019-09-30 – 2019-10-02 (×3): 81 mg via ORAL
  Filled 2019-09-30 (×3): qty 1

## 2019-09-30 MED ORDER — RIVAROXABAN 20 MG PO TABS
20.0000 mg | ORAL_TABLET | Freq: Every day | ORAL | Status: DC
  Administered 2019-09-30 – 2019-10-02 (×3): 20 mg via ORAL
  Filled 2019-09-30 (×3): qty 1

## 2019-09-30 MED ORDER — AMIODARONE HCL 100 MG PO TABS
100.0000 mg | ORAL_TABLET | Freq: Every day | ORAL | Status: DC
  Administered 2019-09-30 – 2019-10-02 (×3): 100 mg via ORAL
  Filled 2019-09-30 (×3): qty 1

## 2019-09-30 MED ORDER — SODIUM CHLORIDE 0.9% FLUSH
3.0000 mL | Freq: Two times a day (BID) | INTRAVENOUS | Status: DC
  Administered 2019-09-30 – 2019-10-02 (×5): 3 mL via INTRAVENOUS

## 2019-09-30 MED ORDER — ALBUTEROL SULFATE (2.5 MG/3ML) 0.083% IN NEBU
2.5000 mg | INHALATION_SOLUTION | RESPIRATORY_TRACT | Status: DC | PRN
  Administered 2019-09-30: 2.5 mg via RESPIRATORY_TRACT
  Filled 2019-09-30: qty 3

## 2019-09-30 MED ORDER — ALBUTEROL SULFATE HFA 108 (90 BASE) MCG/ACT IN AERS: 2.0000 | INHALATION_SPRAY | Freq: Four times a day (QID) | RESPIRATORY_TRACT | Status: DC | PRN

## 2019-09-30 MED ORDER — HYDRALAZINE HCL 25 MG PO TABS
25.0000 mg | ORAL_TABLET | Freq: Three times a day (TID) | ORAL | Status: DC
  Administered 2019-09-30 – 2019-10-02 (×7): 25 mg via ORAL
  Filled 2019-09-30 (×7): qty 1

## 2019-09-30 MED ORDER — GABAPENTIN 400 MG PO CAPS
400.0000 mg | ORAL_CAPSULE | Freq: Two times a day (BID) | ORAL | Status: DC
  Administered 2019-09-30 – 2019-10-02 (×6): 400 mg via ORAL
  Filled 2019-09-30 (×6): qty 1

## 2019-09-30 MED ORDER — MOMETASONE FURO-FORMOTEROL FUM 200-5 MCG/ACT IN AERO
2.0000 | INHALATION_SPRAY | Freq: Two times a day (BID) | RESPIRATORY_TRACT | Status: DC
  Administered 2019-10-01 – 2019-10-02 (×3): 2 via RESPIRATORY_TRACT
  Filled 2019-09-30 (×2): qty 8.8

## 2019-09-30 MED ORDER — UMECLIDINIUM BROMIDE 62.5 MCG/INH IN AEPB
1.0000 | INHALATION_SPRAY | Freq: Every day | RESPIRATORY_TRACT | Status: DC
  Administered 2019-09-30 – 2019-10-02 (×3): 1 via RESPIRATORY_TRACT
  Filled 2019-09-30: qty 7

## 2019-09-30 MED ORDER — ACETAMINOPHEN 325 MG PO TABS: 650.0000 mg | ORAL_TABLET | ORAL | Status: DC | PRN

## 2019-09-30 MED ORDER — ONDANSETRON HCL 4 MG/2ML IJ SOLN: 4.0000 mg | Freq: Four times a day (QID) | INTRAMUSCULAR | Status: DC | PRN

## 2019-09-30 MED ORDER — PANTOPRAZOLE SODIUM 40 MG PO TBEC
40.0000 mg | DELAYED_RELEASE_TABLET | Freq: Every day | ORAL | Status: DC
  Administered 2019-09-30 – 2019-10-02 (×3): 40 mg via ORAL
  Filled 2019-09-30 (×3): qty 1

## 2019-09-30 MED ORDER — METOPROLOL SUCCINATE ER 100 MG PO TB24
100.0000 mg | ORAL_TABLET | Freq: Every day | ORAL | Status: DC
  Administered 2019-09-30 – 2019-10-02 (×3): 100 mg via ORAL
  Filled 2019-09-30 (×3): qty 1

## 2019-09-30 MED ORDER — NITROGLYCERIN 2 % TD OINT
1.0000 [in_us] | TOPICAL_OINTMENT | Freq: Once | TRANSDERMAL | Status: AC
  Administered 2019-09-30: 1 [in_us] via TOPICAL
  Filled 2019-09-30: qty 1

## 2019-09-30 MED ORDER — FUROSEMIDE 10 MG/ML IJ SOLN
80.0000 mg | Freq: Once | INTRAMUSCULAR | Status: AC
  Administered 2019-09-30: 80 mg via INTRAVENOUS
  Filled 2019-09-30: qty 8

## 2019-09-30 MED ORDER — BENAZEPRIL HCL 40 MG PO TABS
40.0000 mg | ORAL_TABLET | Freq: Every day | ORAL | Status: DC
  Administered 2019-09-30 – 2019-10-02 (×3): 40 mg via ORAL
  Filled 2019-09-30 (×4): qty 1

## 2019-09-30 MED ORDER — OXYCODONE-ACETAMINOPHEN 5-325 MG PO TABS
1.0000 | ORAL_TABLET | Freq: Every day | ORAL | Status: DC | PRN
  Administered 2019-09-30 (×3): 1 via ORAL
  Filled 2019-09-30 (×3): qty 1

## 2019-09-30 MED ORDER — SODIUM CHLORIDE 0.9% FLUSH: 3.0000 mL | INTRAVENOUS | Status: DC | PRN

## 2019-09-30 MED ORDER — SODIUM CHLORIDE 0.9 % IV SOLN: 250.0000 mL | INTRAVENOUS | Status: DC | PRN

## 2019-09-30 MED ORDER — FUROSEMIDE 10 MG/ML IJ SOLN
80.0000 mg | Freq: Two times a day (BID) | INTRAMUSCULAR | Status: DC
  Administered 2019-09-30 – 2019-10-02 (×4): 80 mg via INTRAVENOUS
  Filled 2019-09-30 (×4): qty 8

## 2019-09-30 NOTE — Progress Notes (Signed)
Subjective: Sharon Vasquez is a 69 y.o. female w/ hx of tobaccos use disorder, morbid obesity, HTN, ILD, COPD, PE/LLE DVT, and atrial fibrillation who presented this last night with acute on chronic HFpEF and severe symptomatic HTN. Patient was placed on BiPap and ECHO was performed this morning. Patient was lying in bed during the interview, appeared comfortable and in no acute distress. Patient described complaints of numbness on her left lower extremity as well as a feeling of fluid build-up in her abdomen. Patient also discussed difficulty with taking medications due to the cramping she experiences while on the medication, and requested to try a diet-approach. Pt noted to have a tray full of [employer] in front of her. The team advised her on dietary changes of reducing fluid intake and salt intake, but discussed the need to be on medications due to her presentation. No other complaints were noted.    Objective:  Vital signs in last 24 hours: Vitals:   09/30/19 0547 09/30/19 0720 09/30/19 1040 09/30/19 1047  BP: 132/73 (!) 152/88 (!) 148/80 133/72  Pulse: (!) 57 (!) 55 (!) 58 (!) 57  Resp: (!) 21 (!) 22 20 (!) 24  Temp:      TempSrc:      SpO2: 91% 95% 100% 99%  Weight:      Height:      Weight change:   Intake/Output Summary (Last 24 hours) at 09/30/2019 1109 Last data filed at 09/30/2019 0735 Gross per 24 hour  Intake 0 ml  Output 3500 ml  Net -3500 ml    GEN: NAD, lyng in bed CV: RRR. JVD difficult to assess due to body habitus but likely to the angle of the mandible.  PULM: On auscultation, upper airway expiratory wheezing sounds were heard throughout. EXT: Bilateral +4 pitting edema in the lower extremities. Extremities were warm to touch.     Assessment/Plan:  Active Problems:   Acute CHF (congestive heart failure) (HCC) Acute HFpEF exacerbation. Unclear dry weight. Noncompliance appears to be the primary issue here due to the cramping side effect noted when on  toresamide. Likely, this was exacerbated by increased fluid intake over the day prior to admission for cramps.  Admission weight. 172.4 kg. 24h UOP 3.1 L. Electrolytes stable.   2D echo was performed 09/30/19 and LV ejection fraction, by estimation, was 65 to 70%. The left ventricle has hyperdynamic function. The left ventricle has no regional wall motion abnormalities. The left ventricular internal cavity size was mildly dilated. There is mild concentric left ventricular hypertrophy. Left ventricular diastolic parameters are consistent with Grade II diastolic dysfunction (pseudonormalization). Elevated left atrial pressure.   Plan  --80 mg lasix IV --daily weights, strict I/O, tele monitoring --daily BMP   Acute respiratory failure requiring NIPPV due to HF exacerbation. Now appears to be on room air. PRN BiPAP.   Chronic respiratory alkalosis due to ILD. Likely exacerbated by pulmonary edema.   ILD. PFTs in 2019 consistent with restrictive process. Being worked up outpatient for amiodarone toxicity by pulm with hi-res CT.  OSA. Continue HS CPAP. Tobacco use. Encourage cessation   Hypertensive urgency. Admission BP 207/99. BP improving since admission. Suspect precipitated by hypervolemia.No evidence of end organ damage.   Plan:  --Continue diuresis as above. Continue benazepril, hydralazine, and metoprolol.   Chronic paroxysmal non-valvular atrial fibrillation. Rate controlled on metoprolol. Rhythm controlled on amiodarone. Anticoagulant on xarelto.   PVD s/p angioplasty/stenting of left common and external iliac veins. CAD. Continue statin.   Mixed  incontinence. Seen in [facility] 08/2019 for this issue. Referred to PT for pelvic rehab. Referral also placed on urology.     LOS: 0 days   Leory Plowman, Medical Student 09/30/2019, 11:09 AM

## 2019-09-30 NOTE — Progress Notes (Signed)
Patient placed on Bipap per her request due to not feeling like she can get good breaths.  Patient does have diminished expiratory wheezes.  RT will give PRN albuterol treatment.

## 2019-09-30 NOTE — H&P (Signed)
Date: 09/30/2019               Patient Name:  Sharon Vasquez [name]: [id number]  DOB: 1951/01/10 Age / Sex: 69 y.o., female   PCP: Andrew Au, MD         Medical Service: Internal Medicine Teaching Service         Attending Physician: Dr. Lucious Groves, DO    First Contact: Dr. Marva Panda Pager: [phone]  Second Contact: Dr. Truman Hayward Pager: [phone]       After Hours (After 5p/  First Contact Pager: [phone]  weekends / holidays): Second Contact Pager: [phone]   Chief Complaint: Shortness of breath and headache   History of Present Illness: Sharon Vasquez is a 69 year old female with history of tobacco use disorder , morbid obesity, HTN, ILD, COPD,  PE/LLE DVT in 2015, atrial fibrillation, and COPD who presents for evaluation of headache and shortness of breath. Patient has a diagnosis of mixed incontinence. Her PCP recently referred her to PT for pelvic rehab and urology.  She is taking her torsemide inconsistently and dosing according to her schedule because of the incontinence. She does not know her dry weight and on review of her PCP notes her dry weight has been constantly increasing and hard to set. Dr.Alva, her pulmonologist, recently saw her for check up of chronic lung conditions and further workup of possible ILD related to amiodarone. He noted the 40 lb weight gain in two years. She is up approximately 8 kg since March; Dr. Elsworth Soho had increased her Torsemide to 40 mg in the morning in 20 mg in the afternoon with a goal of losing 10 lbs over the next 2 weeks. So far she is down about 6 lbs from that visit on 6/17 according to documented weights. She was having cramps yesterday and called the clinic to schedule for appointment. She was scheduled for Wednesday. She reports drinking half a gallon of water to help with her cramps.  She was experiencing a headache and increase shortness of breath worse than her baseline. She took her blood pressure and the systolic was 465K, so she took an extra  dose of hydralazine and called EMS. Patient denies any recent illness, cough, chest pain, and abdominal pain.   In the ED patient was hypertensive and had of 8 out of 10 headache.  CT head with contrast reassuring for acute intracranial process.  Patient was placed on BiPAP, given IV hydralazine, Nitropaste, and 80 mg of Lasix.     Meds:  No outpatient medications have been marked as taking for the 09/29/19 encounter [facility] Encounter).     Allergies: Allergies as of 09/29/2019  . (No Known Allergies)   Past Medical History:  Diagnosis Date  . A-fib ([place])   . Anxiety   . Arthritis    "qwhre; joints, back" (04/17/2017)  . Benign breast cyst in female, left 01/08/2017   Found by Screening mammogram, evaluated by U/S on 01/08/17 and determined to be a benign simple breast cyst.  . Cellulitis of left lower leg 05/30/2017  . CHF (congestive heart failure) ([place])   . Chronic low back pain 08/21/2016  . Chronic lower back pain   . Chronic venous insufficiency    [name] 05/30/2017  . COPD (chronic obstructive pulmonary disease) ([place])   . Depression   . DVT (deep venous thrombosis) ([place]) 11/16/2016  . GERD (gastroesophageal reflux disease)   . Headache    "weekly for the last  3 months" (04/17/2017)  . Hyperlipidemia   . Hypertension   . Morbid obesity ([place])   . PE (pulmonary embolism)   . Pulmonary embolism ([place]) 09/21/2014    Family History:  Family History  Problem Relation Age of Onset  . Breast cancer Mother   . Hypertension Mother   . Hyperlipidemia Mother   . Hypertension Maternal Grandfather   . Hyperlipidemia Maternal Grandfather      Social History:  Social History   Tobacco Use  . Smoking status: Current Some Day Smoker    Packs/day: 0.10    Years: 55.00    Pack years: 5.50    Types: Cigarettes    Start date: 08/16/1961  . Smokeless tobacco: Never Used  . Tobacco comment: 5 cigs per day  Vaping Use  . Vaping Use: Never used  Substance Use Topics  . Alcohol  use: No  . Drug use: No     Review of Systems: A complete ROS was negative except as per HPI.   Physical Exam: Blood pressure (!) 146/81, pulse (!) 58, temperature 98.6 F (37 C), temperature source Oral, resp. rate (!) 24, height 6\' 2"  (1.88 m), weight (!) 172.4 kg, SpO2 92 %.  General: chronically ill appearing , morbidly obese HE: Normocephalic, atraumatic , EOMI, Conjunctivae normal ENT: On BiPAP, trachea midline Cardiovascular: Normal rate, regular rhythm.  No murmurs, rubs, or gallops Pulmonary : Poor air movement, bilateral rales at bases Abdominal: distended, soft,  bowel sounds present Musculoskeletal: 2+ pitting edema to knees, no deformity or injury Skin: Chronic venous stasis changes, warm, dry Psychiatric/Behavioral:  normal mood, normal behavior  Neuro: AOx4, moving all 4 extremities.    EKG: personally reviewed my interpretation is sinus rhythm   CXR: personally reviewed my interpretation is cephalization of vascular markings and peribronchial cuffing , Kerley B lines , enlarge cardiac silhouette . Appears to be worsening  Pulmonary edmema when compared to film from 3/28, no effusions or pneumothorax. No acute   Assessment & Plan by Problem: Active Problems:   Acute CHF (congestive heart failure) (HCC)  Sharon Vasquez is a 69 year old female with history of tobacco use disorder , morbid obesity, HTN, ILD, COPD,  PE/LLE DVT in 2015, atrial fibrillation, and COPD who presents with acute on chronic HFpEF in setting of severe symptomatic hypertension.  Acute on chronic HFpEF Severe symptomatic hypertension SBP around 200 on presentation with tachypnea. Due to increased work of breathing she was put on BiPAP in the ED. O2 Sats nl.  Patient refused a ABG for further evaluation of possible hypercapnia.  BP has normalized with hydralazine and 1 dose of IV Lasix 80 mg.  Exacerbation is likely secondary to compliance with medications.  Patient is experiencing incontinence  and therefore not taking her medications consistently. She states she is checking into purchasing a pure wick at home as she is mostly wheelchair-bound.  Patient likely has poor reserve at baseline with COPD, ILD, and morbid obesity. Plan: -Strict ins and outs, weights -Follow-up TEE -Lasix 80 mg (second dose on admission) - benazepril 40 mg daily -Hydralazine 25 mg 3 times daily -Metoprolol succinate 100 mg daily  Paroxysmal atrial fibrillation Patient in sinus rhythm on exam.  Patient being evaluated by pulmonology for possible effects of amiodarone excessively with scheduled high-resolution CT.  However patient is been controlled on amiodarone so reports she made a decision with cardiologist to continue medication for now. -Continue amiodarone 100 mg daily -Continue Xarelto  COPD ILD Patient on ICS/LABA and  LAMA.  Albuterol as needed.  No wheezing on exam.  Current smoker, but improved only smoking about a cigarette a day.  Ruthe Mannan 2 puffs, twice daily - Tiotropium (Incruse) 18 mcg daily  -Albuterol nebulizer every 4 hours as needed -Encourage smoking cessation  Chronic pain Patient followed at pain clinic and receives Percocet.  Also takes gabapentin 400 mg twice daily. Plan: -oxycodone 5/325 - gabapentin  Diet: Heart healthy VTE Px: Xarelto Code: Full  Dispo: Admit patient to Observation with expected length of stay less than 2 midnights.  Signed: Madalyn Rob, MD 09/30/2019, 5:10 AM  After 5pm on weekdays and 1pm on weekends: On Call pager: [phone]

## 2019-09-30 NOTE — ED Notes (Signed)
Pt requested to be placed off of BiPap for 30 minutes and then requested to be placed back on afterwards.

## 2019-09-30 NOTE — ED Notes (Signed)
Patient noted with labored breathing and stated she needed her Bipap. Respiratory Therapy made aware and stated they would be right over, and pt was placed on O2 at 2L via [place]

## 2019-09-30 NOTE — Progress Notes (Signed)
RT attempted to get ABG x2; radial and brachial. Patient screamed in pain. Patient refused.

## 2019-09-30 NOTE — Progress Notes (Signed)
  Echocardiogram 2D Echocardiogram has been performed.  Sharon Vasquez 09/30/2019, 8:57 AM

## 2019-10-01 ENCOUNTER — Ambulatory Visit: Payer: Medicare Other

## 2019-10-01 LAB — BASIC METABOLIC PANEL
Anion gap: 11 (ref 5–15)
BUN: 23 mg/dL (ref 8–23)
CO2: 26 mmol/L (ref 22–32)
Calcium: 8.9 mg/dL (ref 8.9–10.3)
Chloride: 103 mmol/L (ref 98–111)
Creatinine, Ser: 1.37 mg/dL — ABNORMAL HIGH (ref 0.44–1.00)
GFR calc Af Amer: 45 mL/min — ABNORMAL LOW (ref >60)
GFR calc non Af Amer: 39 mL/min — ABNORMAL LOW (ref >60)
Glucose, Bld: 125 mg/dL — ABNORMAL HIGH (ref 70–99)
Potassium: 3.8 mmol/L (ref 3.5–5.1)
Sodium: 140 mmol/L (ref 135–145)

## 2019-10-01 LAB — MAGNESIUM: Magnesium: 1.9 mg/dL (ref 1.7–2.4)

## 2019-10-01 MED ORDER — MAGNESIUM SULFATE 2 GM/50ML IV SOLN
2.0000 g | Freq: Once | INTRAVENOUS | Status: AC
  Administered 2019-10-01: 2 g via INTRAVENOUS
  Filled 2019-10-01: qty 50

## 2019-10-01 MED ORDER — POTASSIUM CHLORIDE 20 MEQ PO PACK
40.0000 meq | PACK | Freq: Once | ORAL | Status: AC
  Administered 2019-10-01: 40 meq via ORAL
  Filled 2019-10-01: qty 2

## 2019-10-01 MED ORDER — OXYCODONE-ACETAMINOPHEN 5-325 MG PO TABS
1.0000 | ORAL_TABLET | ORAL | Status: DC | PRN
  Administered 2019-10-01 – 2019-10-02 (×4): 1 via ORAL
  Filled 2019-10-01 (×4): qty 1

## 2019-10-01 NOTE — Plan of Care (Signed)

## 2019-10-01 NOTE — Plan of Care (Signed)
  Problem: Education: Goal: Knowledge of General Education information will improve Description: Including pain rating scale, medication(s)/side effects and non-pharmacologic comfort measures Outcome: Progressing   Problem: Health Behavior/Discharge Planning: Goal: Ability to manage health-related needs will improve Outcome: Progressing   Problem: Activity: Goal: Risk for activity intolerance will decrease Outcome: Progressing   

## 2019-10-01 NOTE — Progress Notes (Signed)
Subjective: This is day 2 for Sharon Vasquez, a 69 y.o. female w/ hx of tobacco use disorder, morbid obesity, HTN, ILD, COPD, PE/LLE DVT in 2015, and atrial fibrillation who presented with acute HFpEF exacerbation and hypertensive urgency. Patient complains of cramping on her hands, and describes significant pain. She showed the team her hands and mentioned that they appeared very shriveled up. Patient has been off her pain pills (Percocet 5-325 mg tablet Q4H) since admission to the hospital. The team ordered the medication for [name]. Will follow-up in the afternoon to ensure she is getting her pain mgmt medication.    Objective:  Vital signs in last 24 hours: Vitals:   09/30/19 1907 10/01/19 0031 10/01/19 0442 10/01/19 0830  BP: (!) 148/86 113/71 114/61 (!) 120/57  Pulse: (!) 57 (!) 55 61 60  Resp: 20 18 18 16   Temp: 98.3 F (36.8 C) 97.9 F (36.6 C) 98.2 F (36.8 C)   TempSrc: Oral Oral Oral   SpO2: 91% 95% 100% 100%  Weight:   (!) 171.4 kg   Height:      Weight change: -0.967 kg  Intake/Output Summary (Last 24 hours) at 10/01/2019 0908 Last data filed at 10/01/2019 0859 Gross per 24 hour  Intake 240 ml  Output 6050 ml  Net -5810 ml    GEN: NAD, alert and lyng in bed. Patient was able to move more freely today than yesterday.  CV: RRR. No murmurs, rubs or gallops. JVD was still difficult to assess due to body habitus but still likely to the angle of the mandible. Bilateral +4 pitting edema in the lower extremities. Extremities were warm to touch.  PULM: Breathing comfortably on room air. Wheezes have improved from yesterday's exam. No crackles appreciable on auscultation.   Assessment/Plan:  Active Problems:   Morbid obesity (HCC)   PAF (paroxysmal atrial fibrillation) (HCC)   Chronic venous insufficiency   OSA (obstructive sleep apnea)   Mixed incontinence, urge and stress (female) (female)   Acute CHF (congestive heart failure) (HCC)   Acute on chronic diastolic  (congestive) heart failure (HCC)   Acute hypoxemic respiratory failure (HCC) Acute HFpEF exacerbation. Admission echo, with EF of 65-70%, consistent with Grade II diastolic dysfunction (pseudonormalization).   Admission weight. 172.4 kg, weight today was 171.4 kg.  24 net volume: -5.2 L; Net out since admission: -9.3 L Electrolytes remain stable.   Plan  --Continue 80 mg lasix IV  --Continue daily weights, strict I/O, tele monitoring --Continue daily BMP  --Added Mg2+ sulfate IVPB 2 g 50 mL once on 09/30/19. --Administered 40 mEq KCl oral this morning  Hypertensive urgency. Admission BP 207/99, down to 120/57 this morning. BP has improved since admission. No evidence of end organ damage.   Plan --Continue diuresis as above. Continue benazepril, hydralazine, and metoprolol.   Chronic paroxysmal non-valvular atrial fibrillation. Rate controlled on metoprolol. Rhythm controlled on amiodarone. Anticoagulant on xarelto.   Chronic respiratory alkalosis due to ILD. Likely exacerbated by pulmonary edema.  ILD. PFTs in 2019 consistent with restrictive process. Being worked up outpatient for amiodarone toxicity by pulm with hi-res CT.  OSA. Continue HS CPAP. Tobacco use. Encourage cessation PVD s/p angioplasty/stenting of left common and external iliac veins. CAD. Continue statin.  Mixed incontinence. Seen in [facility] 08/2019 for this issue. Referred to PT for pelvic rehab. Referral also placed on urology.   RESOLVED: Acute respiratory failure requiring NIPPV due to HF exacerbation. Transitioned to room air 09/30/19.    LOS: 1 day  Leory Plowman, Medical Student 10/01/2019, 9:08 AM

## 2019-10-02 ENCOUNTER — Telehealth: Payer: Self-pay | Admitting: Student

## 2019-10-02 LAB — BASIC METABOLIC PANEL
Anion gap: 10 (ref 5–15)
BUN: 28 mg/dL — ABNORMAL HIGH (ref 8–23)
CO2: 29 mmol/L (ref 22–32)
Calcium: 9.2 mg/dL (ref 8.9–10.3)
Chloride: 101 mmol/L (ref 98–111)
Creatinine, Ser: 1.26 mg/dL — ABNORMAL HIGH (ref 0.44–1.00)
GFR calc Af Amer: 50 mL/min — ABNORMAL LOW (ref >60)
GFR calc non Af Amer: 43 mL/min — ABNORMAL LOW (ref >60)
Glucose, Bld: 125 mg/dL — ABNORMAL HIGH (ref 70–99)
Potassium: 3.7 mmol/L (ref 3.5–5.1)
Sodium: 140 mmol/L (ref 135–145)

## 2019-10-02 MED ORDER — GABAPENTIN 400 MG PO CAPS: 400.0000 mg | ORAL_CAPSULE | Freq: Two times a day (BID) | ORAL | 0 refills | Status: DC

## 2019-10-02 NOTE — Discharge Summary (Addendum)
Name: Sharon Vasquez [name]: [id number] DOB: May 05, 1950 69 y.o. PCP: Andrew Au, MD  Date of Admission: 09/29/2019 10:53 PM Date of Discharge: 10/02/2019 Attending Physician: Dr. Philipp Ovens  Discharge Diagnosis: 1. Acute on Chronic HFpEF (EF 65-75%)  2. Hypertensive Urgency   Discharge Medications: Allergies as of 10/02/2019   No Known Allergies      Medication List     TAKE these medications    albuterol (2.5 MG/3ML) 0.083% nebulizer solution Commonly known as: PROVENTIL TAKE 3 MLS(1 AMPULE) BY NEBULIZATION EVERY 4 HOURS AS NEEDED FOR WHHEZING OR SHORTNESS OF BREATH What changed:  how much to take how to take this when to take this reasons to take this additional instructions   albuterol 108 (90 Base) MCG/ACT inhaler Commonly known as: ProAir HFA Inhale 2 puffs into the lungs every 6 (six) hours as needed for wheezing or shortness of breath. What changed: Another medication with the same name was changed. Make sure you understand how and when to take each.   amiodarone 200 MG tablet Commonly known as: PACERONE Take 0.5 tablets (100 mg total) by mouth daily.   aspirin EC 81 MG tablet Take 81 mg by mouth daily.   atorvastatin 40 MG tablet Commonly known as: LIPITOR Take 1 tablet (40 mg total) by mouth daily.   benazepril 40 MG tablet Commonly known as: LOTENSIN Take 1 tablet (40 mg total) by mouth daily.   esomeprazole 40 MG capsule Commonly known as: NEXIUM Take 1 capsule (40 mg total) by mouth daily as needed (for heartburn or indigestion).   Fluticasone-Salmeterol 250-50 MCG/DOSE Aepb Commonly known as: Advair Diskus Inhale 1 puff into the lungs 2 (two) times daily. Rinse mouth after each use   gabapentin 400 MG capsule Commonly known as: NEURONTIN Take 1 capsule (400 mg total) by mouth 2 (two) times daily.   hydrALAZINE 50 MG tablet Commonly known as: APRESOLINE Take 0.5 tablets (25 mg total) by mouth 3 (three) times daily.   metoprolol succinate  100 MG 24 hr tablet Commonly known as: Toprol XL Take 1 tablet (100 mg total) by mouth daily. Take with or immediately following a meal.   oxyCODONE-acetaminophen 5-325 MG tablet Commonly known as: PERCOCET/ROXICET Take 1 tablet by mouth daily as needed for severe pain.   potassium chloride 10 MEQ tablet Commonly known as: KLOR-CON TAKE 2 TABLETS(20 MEQ) BY MOUTH TWICE DAILY What changed: See the new instructions.   rivaroxaban 20 MG Tabs tablet Commonly known as: XARELTO Take 1 tablet (20 mg total) by mouth every morning.   Spiriva HandiHaler 18 MCG inhalation capsule Generic drug: tiotropium PLACE 1 CAPSULE INTO INHALER AND INHALE DAILY What changed: See the new instructions.   torsemide 20 MG tablet Commonly known as: DEMADEX TAKE 2 TABLETS BY MOUTH DAILY        Disposition and follow-up:   Sharon Vasquez was discharged from [facility] in Stable condition.  At the hospital follow up visit please address:  1. Severe symptomatic hypertension and acute on chronic HFpEF in the setting of torsemide non compliance-- D/C'd on home Torsemide 60mg . Weight was 170 kg upon discharge. Assess volume status and repeat labs/BMP.  2.  Labs / imaging needed at time of follow-up: Repeat BMP  3.  Pending labs/ test needing follow-up: None  Follow-up Appointments:  Follow-up Information     Andrew Au, MD. Go on 10/09/2019.   Specialty: Student Why: 10:15 Contact information: [place] [id number]  [place] by problem list: 1. Acute on chronic HFpEF in the setting of severe symptomatic hypertension and medication noncompliance -- Presented with headache, shortness of breath, and was severely volume overloaded. Patient reported that she inconsistently took her torsemide. SBP on presentation was around 200 with tachypnea and increased work of breathing. Was placed on BiPAP in the ED. Patient refused an ABG  for evaluation of possible hypercapnia. BP normalized with hydralazine, nitropaste, and IV lasix 80mg . Echo on 09/30/19 showed EF of 65-75% with Grade 2 diastolic dysfunction. Responded well to IV diuresis, reached net negative 11.5L since admission and appeared fairly euvolemic. Discharged with pre-admission diuretic regimen of torsemide 60mg .  Discharge Vitals:   BP (!) 126/59   Pulse (!) 53   Temp 98.4 F (36.9 C) (Oral)   Resp 16   Ht 6\' 2"  (1.88 m)   Wt (!) 374 lb 12.8 oz (170 kg)   SpO2 94%   BMI 48.12 kg/m   Pertinent Labs, Studies, and Procedures:  CBC Latest Ref Rng & Units 09/30/2019 09/29/2019 06/30/2019  WBC 4.0 - 10.5 K/uL - 7.7 5.9  Hemoglobin 12.0 - 15.0 g/dL 10.9(L) 12.2 13.5  Hematocrit 36 - 46 % 32.0(L) 37.2 41.1  Platelets 150 - 400 K/uL - 351 296    BMP Latest Ref Rng & Units 10/02/2019 10/01/2019 09/30/2019  Glucose 70 - 99 mg/dL 125(H) 125(H) 101(H)  BUN 8 - 23 mg/dL 28(H) 23 17  Creatinine 0.44 - 1.00 mg/dL 1.26(H) 1.37(H) 1.11(H)  BUN/Creat Ratio 12 - 28 - - -  Sodium 135 - 145 mmol/L 140 140 140  Potassium 3.5 - 5.1 mmol/L 3.7 3.8 3.5  Chloride 98 - 111 mmol/L 101 103 102  CO2 22 - 32 mmol/L 29 26 28   Calcium 8.9 - 10.3 mg/dL 9.2 8.9 9.0    09/29/19 - CT Head without contrast - IMPRESSION: 1. Stable chronic small vessel ischemic changes. No acute intracranial process.  09/30/19 - DG Chest 2 view - IMPRESSION: 1. Stable chronic central vascular congestion and interstitial scarring. No acute process.  09/30/19 - Echo - IMPRESSIONS   1. Left ventricular ejection fraction, by estimation, is 65 to 70%. The  left ventricle has hyperdynamic function. The left ventricle has no  regional wall motion abnormalities. The left ventricular internal cavity  size was mildly dilated. There is mild  concentric left ventricular hypertrophy. Left ventricular diastolic  parameters are consistent with Grade II diastolic dysfunction  (pseudonormalization). Elevated left  atrial pressure.   2. Right ventricular systolic function is hyperdynamic. The right  ventricular size is normal.   3. Left atrial size was moderately dilated.   4. The mitral valve is normal in structure. Trivial mitral valve  regurgitation.   5. The aortic valve is normal in structure. Aortic valve regurgitation is  not visualized. Mild aortic valve sclerosis is present, with no evidence  of aortic valve stenosis.   6. The inferior vena cava is dilated in size with >50% respiratory  variability, suggesting right atrial pressure of 8 mmHg.     Discharge Instructions: Discharge Instructions     (HEART FAILURE PATIENTS) Call MD:  Anytime you have any of the following symptoms: 1) 3 pound weight gain in 24 hours or 5 pounds in 1 week 2) shortness of breath, with or without a dry hacking cough 3) swelling in the hands, feet or stomach 4) if you  have to sleep on extra pillows at night in order to breathe.   Complete by: As directed    Call MD for:  difficulty breathing, headache or visual disturbances   Complete by: As directed    Call MD for:  extreme fatigue   Complete by: As directed    Call MD for:  hives   Complete by: As directed    Call MD for:  persistant dizziness or light-headedness   Complete by: As directed    Call MD for:  persistant nausea and vomiting   Complete by: As directed    Call MD for:  redness, tenderness, or signs of infection (pain, swelling, redness, odor or green/yellow discharge around incision site)   Complete by: As directed    Call MD for:  severe uncontrolled pain   Complete by: As directed    Call MD for:  temperature >100.4   Complete by: As directed    Diet - low sodium heart healthy   Complete by: As directed    Increase activity slowly   Complete by: As directed        Signed: Virl Axe, MD 10/03/2019, 10:23 AM   Pager: [phone]

## 2019-10-02 NOTE — Progress Notes (Addendum)
Discharge education and medication education given to patient  with teach back. Education on increasing activity slowly, when to call MD, and low sodium heart healthy diet given.  All questions and concerns answered. Peripheral IV and telemetry leads removed. All patient belongings given to patient. Patient transported to main entrance via wheelchair by nurse tech.

## 2019-10-02 NOTE — Discharge Instructions (Signed)
Sharon Vasquez,  You were admitted for volume overload due to heart failure. This was likely due to not taking your medications (Torsemide) as prescribed. We gave you diuretics during your stay here to take excess fluid out of your body. We recommend that you take your medications as prescribed. We are glad that you are feeling better! Please follow up with your primary care physician in one week. Take care.  Information on my medicine - XARELTO (Rivaroxaban)  Why was Xarelto prescribed for you? Xarelto was prescribed for you to reduce the risk of a blood clot forming that can cause a stroke if you have a medical condition called atrial fibrillation (a type of irregular heartbeat).  What do you need to know about xarelto ? Take your Xarelto ONCE DAILY at the same time every day with your evening meal. If you have difficulty swallowing the tablet whole, you may crush it and mix in applesauce just prior to taking your dose.  Take Xarelto exactly as prescribed by your doctor and DO NOT stop taking Xarelto without talking to the doctor who prescribed the medication.  Stopping without other stroke prevention medication to take the place of Xarelto may increase your risk of developing a clot that causes a stroke.  Refill your prescription before you run out.  After discharge, you should have regular check-up appointments with your healthcare provider that is prescribing your Xarelto.  In the future your dose may need to be changed if your kidney function or weight changes by a significant amount.  What do you do if you miss a dose? If you are taking Xarelto ONCE DAILY and you miss a dose, take it as soon as you remember on the same day then continue your regularly scheduled once daily regimen the next day. Do not take two doses of Xarelto at the same time or on the same day.   Important Safety Information A possible side effect of Xarelto is bleeding. You should call your healthcare provider  right away if you experience any of the following: ? Bleeding from an injury or your nose that does not stop. ? Unusual colored urine (red or dark brown) or unusual colored stools (red or black). ? Unusual bruising for unknown reasons. ? A serious fall or if you hit your head (even if there is no bleeding).  Some medicines may interact with Xarelto and might increase your risk of bleeding while on Xarelto. To help avoid this, consult your healthcare provider or pharmacist prior to using any new prescription or non-prescription medications, including herbals, vitamins, non-steroidal anti-inflammatory drugs (NSAIDs) and supplements.  This website has more information on Xarelto: [email or web address]

## 2019-10-02 NOTE — Progress Notes (Signed)
Subjective: This is day 3 for Sharon Vasquez, a 69 y.o. female w/ hx of tobacco use disorder, morbid obesity, HTN, ILD, COPD, PE/LLE DVT in 2015, and atrial fibrillation who presented with acute HFpEF exacerbation and hypertensive urgency. Patient denies any further headache, feeling of fullness and fluid in the abdomen as previously felt, and she also denies any further numbness in her LLE that is different from her baseline level. Patient did not comment on any further cramping of her hands as seen before. Patient asked for her pain pills the previous day, however today she did not bring it up. The nurse mentioned that she has had access to her Percocet 5-325 mg tablet Q4H but she has not requested any during her stay. Patient requested that she wants to be discharged today and was adamant about leaving today. We advised another day at the hospital in order to get her to a euvolemic state, however she declined and stated that she will be leaving today.   Objective:  Vital signs in last 24 hours: Vitals:   10/02/19 0739 10/02/19 0741 10/02/19 0814 10/02/19 1202  BP:   116/60 (!) 126/59  Pulse:   61 (!) 53  Resp:   16 16  Temp:    98.4 F (36.9 C)  TempSrc:    Oral  SpO2: 95% 95% 95% 94%  Weight:      Height:      Weight change: -1.392 kg  Intake/Output Summary (Last 24 hours) at 10/02/2019 1311 Last data filed at 10/02/2019 0817 Gross per 24 hour  Intake 1323 ml  Output 2500 ml  Net -1177 ml    GEN: NAD, alert and sitting up comfortably.   CV: RRR. No murmurs, rubs or gallops. JVD was still difficult to assess due to body habitus but still likely to the angle of the mandible. Bilateral +3 trace edema in the lower extremities. Extremities were warm to touch.  PULM: Breathing comfortably on room air. Wheezes have improved but still present on auscultation. No crackles appreciable.   Assessment/Plan:  Active Problems:   Morbid obesity (HCC)   PAF (paroxysmal atrial fibrillation)  (HCC)   Chronic venous insufficiency   OSA (obstructive sleep apnea)   Mixed incontinence, urge and stress (female) (female)   Acute CHF (congestive heart failure) (HCC)   Acute on chronic diastolic (congestive) heart failure (HCC)   Acute hypoxemic respiratory failure (HCC) Acute HFpEF exacerbation. Admission echo, with EF of 65-70%, consistent with Grade II diastolic dysfunction (pseudonormalization).   Admission weight. 172.4 kg, weight today was 170 kg.  24 net volume: -2.7 L; Net out since admission: -12 L Electrolytes remain stable.   Plan  --Continue 80 mg lasix IV in order to continue improving her hypervolemic state. Patient's Cr level dropped from 1.37 to 1.26 so she has tolerated the lasix and will continue to administer.  --Continue daily weights, strict I/O, tele monitoring. --Continue daily BMP.  --Advised to continue torsemide 60 mg daily after discharge to keep her symptoms under control, compliance has been an issue in the past   Hypertensive urgency. Admission BP 207/99, down to 126/70 this morning. BP has improved since admission. No evidence of end organ damage.   Plan --Continue diuresis as above. Continue benazepril, hydralazine, and metoprolol.   Chronic paroxysmal non-valvular atrial fibrillation. Rate controlled on metoprolol. Rhythm controlled on amiodarone. Anticoagulant on xarelto.   Chronic respiratory alkalosis due to ILD. Likely exacerbated by pulmonary edema.  ILD. PFTs in 2019 consistent with restrictive  process. Being worked up outpatient for amiodarone toxicity by pulm with hi-res CT.  OSA. Continue HS CPAP. Tobacco use. Encourage cessation. PVD s/p angioplasty/stenting of left common and external iliac veins. CAD. Continue statin.  Mixed incontinence. Seen in [facility] 08/2019 for this issue. Referred to PT for pelvic rehab. Referral also placed on urology.   RESOLVED: Acute respiratory failure requiring NIPPV due to HF exacerbation. Transitioned to room  air 09/30/19.    LOS: 2 days   Leory Plowman, Medical Student 10/02/2019, 1:11 PM

## 2019-10-02 NOTE — Telephone Encounter (Signed)
HFU PER DR KREINKE/ PT APPT 10/09/19 1015AM/NW

## 2019-10-03 ENCOUNTER — Ambulatory Visit: Payer: Self-pay | Admitting: *Deleted

## 2019-10-03 ENCOUNTER — Telehealth: Payer: Medicare Other

## 2019-10-03 NOTE — Chronic Care Management (AMB) (Signed)
  Chronic Care Management   Outreach Note  10/03/2019 Name: Sharon Vasquez [name]: [id number] DOB: 1951-02-16  Referred by: Andrew Au, MD Reason for referral : Chronic Care Management (COPD, HF, HTN, PAF, PE)   An unsuccessful telephone outreach was attempted today. The patient was referred to the case management team for assistance with care management and care coordination. Reason for call was to complete transition of care assessment. Sharon Vasquez was hospitalized at [facility] from 6/29-10/02/19 for hypertensive emergency and acute on chronic heart failure secondary to torsemide noncompliance.   Follow Up Plan: The care management team will reach out to the patient again over the next 3-4 business days.   Kelli Churn RN, CCM, [facility] RN Care Manager [phone]

## 2019-10-06 ENCOUNTER — Other Ambulatory Visit: Payer: Self-pay | Admitting: Internal Medicine

## 2019-10-06 DIAGNOSIS — I5032 Chronic diastolic (congestive) heart failure: Secondary | ICD-10-CM

## 2019-10-06 DIAGNOSIS — I10 Essential (primary) hypertension: Secondary | ICD-10-CM

## 2019-10-07 ENCOUNTER — Ambulatory Visit: Payer: Medicare Other | Admitting: *Deleted

## 2019-10-07 DIAGNOSIS — I5032 Chronic diastolic (congestive) heart failure: Secondary | ICD-10-CM

## 2019-10-07 DIAGNOSIS — M7989 Other specified soft tissue disorders: Secondary | ICD-10-CM | POA: Diagnosis not present

## 2019-10-07 DIAGNOSIS — M170 Bilateral primary osteoarthritis of knee: Secondary | ICD-10-CM | POA: Diagnosis not present

## 2019-10-07 DIAGNOSIS — I480 Paroxysmal atrial fibrillation: Secondary | ICD-10-CM

## 2019-10-07 DIAGNOSIS — I10 Essential (primary) hypertension: Secondary | ICD-10-CM

## 2019-10-07 DIAGNOSIS — Z79899 Other long term (current) drug therapy: Secondary | ICD-10-CM | POA: Diagnosis not present

## 2019-10-07 DIAGNOSIS — J439 Emphysema, unspecified: Secondary | ICD-10-CM

## 2019-10-07 NOTE — Chronic Care Management (AMB) (Addendum)
Chronic Care Management   Follow Up Note   10/07/2019 Name: Sharon Vasquez [name]: [id number] DOB: Aug 15, 1950  Referred by: Andrew Au, MD Reason for referral : Chronic Care Management (COPD, HF, HTN, PAF, PE)   Sharon Vasquez is a 69 y.o. year old female who is a primary care patient of [name], [name], MD. The CCM team was consulted for assistance with chronic disease management and care coordination needs.    Review of patient status, including review of consultants reports, relevant laboratory and other test results, and collaboration with appropriate care team members and the patient's provider was performed as part of comprehensive patient evaluation and provision of chronic care management services.    SDOH (Social Determinants of Health) assessments performed: No See Care Plan activities for detailed interventions related to [facility])     Outpatient Encounter Medications as of 10/07/2019  Medication Sig Note  . albuterol (PROAIR HFA) 108 (90 Base) MCG/ACT inhaler Inhale 2 puffs into the lungs every 6 (six) hours as needed for wheezing or shortness of breath.   [name] albuterol (PROVENTIL) (2.5 MG/3ML) 0.083% nebulizer solution TAKE 3 MLS(1 AMPULE) BY NEBULIZATION EVERY 4 HOURS AS NEEDED FOR WHHEZING OR SHORTNESS OF BREATH (Patient taking differently: Take 2.5 mg by nebulization every 4 (four) hours as needed for wheezing or shortness of breath. ) 06/24/2019: Use once every two weeks  . amiodarone (PACERONE) 200 MG tablet Take 0.5 tablets (100 mg total) by mouth daily.   [name] aspirin EC 81 MG tablet Take 81 mg by mouth daily.   [name] atorvastatin (LIPITOR) 40 MG tablet Take 1 tablet (40 mg total) by mouth daily.   . benazepril (LOTENSIN) 40 MG tablet Take 1 tablet (40 mg total) by mouth daily.   [name] esomeprazole (NEXIUM) 40 MG capsule Take 1 capsule (40 mg total) by mouth daily as needed (for heartburn or indigestion).   . Fluticasone-Salmeterol (ADVAIR DISKUS) 250-50 MCG/DOSE AEPB Inhale 1 puff into  the lungs 2 (two) times daily. Rinse mouth after each use   . gabapentin (NEURONTIN) 400 MG capsule Take 1 capsule (400 mg total) by mouth 2 (two) times daily.   . hydrALAZINE (APRESOLINE) 50 MG tablet Take 0.5 tablets (25 mg total) by mouth 3 (three) times daily.   . metoprolol succinate (TOPROL XL) 100 MG 24 hr tablet Take 1 tablet (100 mg total) by mouth daily. Take with or immediately following a meal.   . oxyCODONE-acetaminophen (PERCOCET/ROXICET) 5-325 MG tablet Take 1 tablet by mouth daily as needed for severe pain. 06/24/2019: Takes every 6 hours round the clock  . potassium chloride (KLOR-CON) 10 MEQ tablet TAKE 2 TABLETS(20 MEQ) BY MOUTH TWICE DAILY (Patient taking differently: Take 20 mEq by mouth 2 (two) times daily. )   . rivaroxaban (XARELTO) 20 MG TABS tablet Take 1 tablet (20 mg total) by mouth every morning.   [name] SPIRIVA HANDIHALER 18 MCG inhalation capsule PLACE 1 CAPSULE INTO INHALER AND INHALE DAILY (Patient taking differently: Place 18 mcg into inhaler and inhale daily. )   . torsemide (DEMADEX) 20 MG tablet TAKE 2 TABLETS BY MOUTH DAILY (Patient taking differently: Take 40 mg by mouth daily. )    No facility-administered encounter medications on file as of 10/07/2019.     Objective:  Echocardiogram results of 09/30/19 Left ventricular ejection fraction, by estimation, is 65 to 70%. The left ventricle has hyperdynamic function. The left ventricle has no regional wall motion abnormalities. The left ventricular internal cavity size was mildly  dilated. There is mild concentric left ventricular hypertrophy. Left ventricular diastolic parameters are consistent with Grade II diastolic dysfunction (pseudonormalization). Elevated left atrial pressure. 2. Right ventricular systolic function is hyperdynamic. The right ventricular size is normal. 3. Left atrial size was moderately dilated. 4. The mitral valve is normal in structure. Trivial mitral valve regurgitation. 5. The aortic valve  is normal in structure. Aortic valve regurgitation is not visualized. Mild aortic valve sclerosis is present, with no evidence of aortic valve stenosis. 6. The inferior vena cava is dilated in size with >50% respiratory variability, suggesting right atrial pressure of 8 mmHg. Comparison(s): Prior images reviewed side by side. The left ventricular function is unchanged. The left ventricular diastolic function is significantly worse. Findings are consistent with high output diastolic heart failure, such as obesity cardiomyopathy (should exclude anemia, thyrotoxicosis, AV fistula, etc.).  Wt Readings from Last 3 Encounters:  10/02/19 (!) 374 lb 12.8 oz (170 kg)  09/18/19 (!) 385 lb 12.8 oz (175 kg)  08/21/19 (!) 387 lb 1.6 oz (175.6 kg)   BP Readings from Last 3 Encounters:  10/02/19 (!) 126/59  09/18/19 120/86  08/21/19 (!) 147/65    Goals Addressed              This Visit's Progress     Patient Stated   .  "I try to do what my doctors tell me to do" (pt-stated)        [place] (see longitudinal plan of care for additional care plan information)  Current Barriers:  . Chronic Disease Management support, education, and care coordination needs related to Atrial Fibrillation, CHF, HTN, and COPD-Spoke with patient to complete transition of care assessment, she was hospitalized from 6/29-10/02/19 for elevated blood pressure and heart failure she says she takes her torsemide daily but varies the time she takes it if she is going out since she has urge incontinence, she says she went to the hospital because of elevated blood pressure that she attributes to something she ate, she says she was told to weight herself daily, says her home monitored blood pressure readings are back to normal and she is taking her 3 blood pressure medicines as prescribed  Clinical Goal(s) related to Atrial Fibrillation, CHF, HTN, and COPD:  Over the next 30 days, patient will:  . Work with the care  management team to address educational, disease management, and care coordination needs  . Begin or continue self health monitoring activities as directed today Measure and record blood pressure 5-7 times per week and Measure and record weight daily . Call provider office for new or worsened signs and symptoms Blood pressure findings outside established parameters, Weight outside established parameters, Shortness of breath, and New or worsened symptom related to CHF and COPD . Call care management team with questions or concerns . Verbalize basic understanding of patient centered plan of care established today  Interventions related to Atrial Fibrillation, CHF, HTN, and COPD:  . Appropriate assessments completed . Transition of care assessment completed . Reviewed heart failure action plan . Reviewed upcoming appointments on 7/7/, 7/8/ and CT of chest on 7/9 and ensured she has transportation  Patient Self Care Activities related to Atrial Fibrillation, CHF, HTN, and COPD:  . Patient is unable to independently self-manage chronic health conditions  Please see past updates related to this goal by clicking on the "Past Updates" button in the selected goal          Plan:   The care management  team will reach out to the patient again over the next 30-60 days.    Kelli Churn RN, CCM, [facility] RN Care Manager [phone]

## 2019-10-07 NOTE — Patient Instructions (Signed)
Visit Information I'm glad you have recovered from your hospitalization. Please keep your upcoming appointments with pulmonologist on 7/7, clinic visit on 7/8 and CT of chest on 7/9.  Goals Addressed              This Visit's Progress     Patient Stated   .  "I try to do what my doctors tell me to do" (pt-stated)        Sharon Vasquez (see longitudinal plan of care for additional care plan information)  Current Barriers:  . Chronic Disease Management support, education, and care coordination needs related to Atrial Fibrillation, CHF, HTN, and COPD-Spoke with patient to complete transition of care assessment, she was hospitalized from 6/29-10/02/19 for elevated blood pressure and heart failure she says she takes her torsemide daily but varies the time she takes it if she is going out, since she has urge incontinence, she says she went to the hospital because of elevated blood pressure that she attributes to something she ate, she says she was told to weight herself daily, says her home monitored blood pressure readings are back to normal and she is taking her 3 blood pressure medicines as prescribed  Clinical Goal(s) related to Atrial Fibrillation, CHF, HTN, and COPD:  Over the next 30 days, patient will:  . Work with the care management team to address educational, disease management, and care coordination needs  . Begin or continue self health monitoring activities as directed today Measure and record blood pressure 5-7 times per week and Measure and record weight daily . Call provider office for new or worsened signs and symptoms Blood pressure findings outside established parameters, Weight outside established parameters, Shortness of breath, and New or worsened symptom related to CHF and COPD . Call care management team with questions or concerns . Verbalize basic understanding of patient centered plan of care established today  Interventions related to Atrial Fibrillation, CHF, HTN,  and COPD:  . Appropriate assessments completed . Transition of care assessment completed . Reviewed heart failure action plan . Reviewed upcoming appointments on 7/7/, 7/8/ and CT of chest on 7/9 and ensured she has transportation  Patient Self Care Activities related to Atrial Fibrillation, CHF, HTN, and COPD:  . Patient is unable to independently self-manage chronic health conditions  Please see past updates related to this goal by clicking on the "Past Updates" button in the selected goal         The patient verbalized understanding of instructions provided today and declined a print copy of patient instruction materials.   The care management team will reach out to the patient again over the next 30-60 days.   Kelli Churn RN, CCM, [facility] RN Care Manager [phone]

## 2019-10-08 ENCOUNTER — Ambulatory Visit: Payer: Medicare Other | Admitting: Primary Care

## 2019-10-08 NOTE — Telephone Encounter (Signed)
TC placed to patient for Transitions of Care follow-up call, VM obtained and [employer] compliant message left. SChaplin, RN,BSN

## 2019-10-08 NOTE — Progress Notes (Deleted)
@Patient  ID: Sharon Vasquez, female    DOB: June 15, 1950, 69 y.o.   [name]: [id number]  No chief complaint on file.   Referring provider: Neva Seat, MD  HPI: 69 year old female, current smoker. PMH significant for COPD, ILD, OSA, vocal cord polyp, CHF, afib, HTN, GERD, osteoporosis. [name], last seen on 09/18/19.   Previous [facility] pulmonary encounter:   69 yo morbidly obese smoker for follow-up of ILD and OSA She smoked up to 2 packs/day, has now decreased to about 2 cigarettes daily  PMH - chronic diastolic heart failure.She has a history of pulmonary embolism/ LLE DVT2015 and has been maintained on Xarelto since then. She underwent venoplasty and stenting of left common iliac and external iliac vein Duplex 05/2017 did not show any residual stenosis. -on amiodarone and Xarelto for chronic atrial fibrillation   Annual follow-up. Breathing is much worse, she reports class III NYHA symptoms unable to carry out activities of daily living, in fact using wheelchair more. Gained about 40 pounds from 345 pounds last visit to 385 pounds now. She underwent peripheral nerve cryoablation in her left foot but this did not work, steroid injections in her knees seems to help better. She is using her CPAP even during the daytime, using it "like oxygen". No problems with mask or pressure. Download was reviewed which shows excellent compliance on 13 cm, no residual events, large leak  Labs reviewed 06/2019 BUN/creatinine 13/0.97. She continues to smoke 4 to 5 cigarettes a week although this is much improved from prior   10/08/2019 Patient presents today for 2 week follow-up. Her breathing was worse during her last visit, torsemide was increased to 40mg  in the morning and an extra 20mg  in the afternoon. She was ordered for HRCT, if fibrosis worse than will need to stop amiodarone. Smoking cessation STRONGLY encouraged. No significant airway obstruction on PFTs- doubt related to COPD.     Needs repeat BMET at follow-up.          Significant tests/ events reviewed  HRCT 12/2017 >> Mild interlobular septal thickening and mild patchy peribronchovascular ground-glass opacity in both lungs. Minimal patchy subpleural reticulation in the upper lobes ? Pul edema vs RB-ILD  PFTs9/2019- No obstruction, mild-mod restriction, decreased DLCO  FVC 2.16 (66%), FEV1 1.93 (75%), ratio 89, DLCOcor 16.45 (48%)  FENO 12/2017 5  ECHO 07/2016-EF 60-65%, PA peak pressure 46mm Hg  HST 12/2017 >> AHI 20/h Review of Systems neg for any significant sore throat, dysphagia, itching, sneezing, nasal congestion or excess/ purulent secretions, fever, chills, sweats, unintended wt loss, pleuritic or exertional cp, hempoptysis, orthopnea pnd or change in chronic leg swelling. Also denies presyncope, palpitations, heartburn, abdominal pain, nausea, vomiting, diarrhea or change in bowel or urinary habits, dysuria,hematuria, rash, arthralgias, visual complaints, headache, numbness weakness or ataxia.    No Known Allergies  Immunization History  Administered Date(s) Administered  . Moderna SARS-COVID-2 Vaccination 08/20/2019, 09/11/2019  . Pneumococcal Conjugate-13 02/01/2017  . Tdap 06/05/2019    Past Medical History:  Diagnosis Date  . A-fib ([place])   . Anxiety   . Arthritis    "qwhre; joints, back" (04/17/2017)  . Benign breast cyst in female, left 01/08/2017   Found by Screening mammogram, evaluated by [facility] on 01/08/17 and determined to be a benign simple breast cyst.  . Cellulitis of left lower leg 05/30/2017  . CHF (congestive heart failure) ([place])   . Chronic low back pain 08/21/2016  . Chronic lower back pain   .  Chronic venous insufficiency    [name] 05/30/2017  . COPD (chronic obstructive pulmonary disease) ([place])   . Depression   . DVT (deep venous thrombosis) ([place]) 11/16/2016  . GERD (gastroesophageal reflux disease)   . Headache    "weekly for the last 3 months" (04/17/2017)   . Hyperlipidemia   . Hypertension   . Morbid obesity ([place])   . PE (pulmonary embolism)   . Pulmonary embolism ([place]) 09/21/2014    Tobacco History: Social History   Tobacco Use  Smoking Status Current Some Day Smoker  . Packs/day: 0.10  . Years: 55.00  . Pack years: 5.50  . Types: Cigarettes  . Start date: 08/16/1961  Smokeless Tobacco Never Used  Tobacco Comment   5 cigs per day   Ready to quit: Not Answered Counseling given: Not Answered Comment: 5 cigs per day   Outpatient Medications Prior to Visit  Medication Sig Dispense Refill  . albuterol (PROAIR HFA) 108 (90 Base) MCG/ACT inhaler Inhale 2 puffs into the lungs every 6 (six) hours as needed for wheezing or shortness of breath. 8 g 3  . albuterol (PROVENTIL) (2.5 MG/3ML) 0.083% nebulizer solution TAKE 3 MLS(1 AMPULE) BY NEBULIZATION EVERY 4 HOURS AS NEEDED FOR WHHEZING OR SHORTNESS OF BREATH (Patient taking differently: Take 2.5 mg by nebulization every 4 (four) hours as needed for wheezing or shortness of breath. ) 75 mL 2  . amiodarone (PACERONE) 200 MG tablet Take 0.5 tablets (100 mg total) by mouth daily. 45 tablet 3  . aspirin EC 81 MG tablet Take 81 mg by mouth daily.    [name] atorvastatin (LIPITOR) 40 MG tablet Take 1 tablet (40 mg total) by mouth daily. 90 tablet 3  . benazepril (LOTENSIN) 40 MG tablet Take 1 tablet (40 mg total) by mouth daily. 90 tablet 3  . esomeprazole (NEXIUM) 40 MG capsule Take 1 capsule (40 mg total) by mouth daily as needed (for heartburn or indigestion). 90 capsule 1  . Fluticasone-Salmeterol (ADVAIR DISKUS) 250-50 MCG/DOSE AEPB Inhale 1 puff into the lungs 2 (two) times daily. Rinse mouth after each use 60 each 6  . gabapentin (NEURONTIN) 400 MG capsule Take 1 capsule (400 mg total) by mouth 2 (two) times daily. 180 capsule 0  . hydrALAZINE (APRESOLINE) 50 MG tablet Take 0.5 tablets (25 mg total) by mouth 3 (three) times daily. 90 tablet 3  . metoprolol succinate (TOPROL-XL) 100 MG 24 hr  tablet TAKE 1 TABLET(100 MG) BY MOUTH DAILY WITH OR IMMEDIATELY FOLLOWING A MEAL 30 tablet 5  . oxyCODONE-acetaminophen (PERCOCET/ROXICET) 5-325 MG tablet Take 1 tablet by mouth daily as needed for severe pain. 5 tablet 0  . potassium chloride (KLOR-CON) 10 MEQ tablet TAKE 2 TABLETS(20 MEQ) BY MOUTH TWICE DAILY (Patient taking differently: Take 20 mEq by mouth 2 (two) times daily. ) 360 tablet 1  . rivaroxaban (XARELTO) 20 MG TABS tablet Take 1 tablet (20 mg total) by mouth every morning. 90 tablet 3  . SPIRIVA HANDIHALER 18 MCG inhalation capsule PLACE 1 CAPSULE INTO INHALER AND INHALE DAILY (Patient taking differently: Place 18 mcg into inhaler and inhale daily. ) 30 capsule 6  . torsemide (DEMADEX) 20 MG tablet TAKE 2 TABLETS BY MOUTH DAILY (Patient taking differently: Take 40 mg by mouth daily. ) 180 tablet 1   No facility-administered medications prior to visit.      Review of Systems  Review of Systems   Physical Exam  There were no vitals taken for this visit. Physical Exam  Lab Results:  CBC    Component Value Date/Time   WBC 7.7 09/29/2019 2304   RBC 4.15 09/29/2019 2304   HGB 10.9 (L) 09/30/2019 0418   HGB 12.4 04/29/2018 1235   HCT 32.0 (L) 09/30/2019 0418   HCT 37.2 04/29/2018 1235   PLT 351 09/29/2019 2304   PLT 389 04/29/2018 1235   MCV 89.6 09/29/2019 2304   MCV 86 04/29/2018 1235   MCH 29.4 09/29/2019 2304   MCHC 32.8 09/29/2019 2304   RDW 14.3 09/29/2019 2304   RDW 13.4 04/29/2018 1235   LYMPHSABS 2.6 09/29/2019 2304   MONOABS 0.5 09/29/2019 2304   EOSABS 0.1 09/29/2019 2304   BASOSABS 0.0 09/29/2019 2304    BMET    Component Value Date/Time   NA 140 10/02/2019 0538   NA 142 04/29/2018 1235   K 3.7 10/02/2019 0538   CL 101 10/02/2019 0538   CO2 29 10/02/2019 0538   GLUCOSE 125 (H) 10/02/2019 0538   BUN 28 (H) 10/02/2019 0538   BUN 17 04/29/2018 1235   CREATININE 1.26 (H) 10/02/2019 0538   CREATININE 1.21 (H) 07/26/2016 1528   CALCIUM 9.2  10/02/2019 0538   GFRNONAA 43 (L) 10/02/2019 0538   GFRAA 50 (L) 10/02/2019 0538    BNP    Component Value Date/Time   BNP 146.1 (H) 09/29/2019 2328    ProBNP No results found for: PROBNP  Imaging: DG Chest 2 View  Result Date: 09/30/2019 CLINICAL DATA:  Shortness of breath EXAM: CHEST - 2 VIEW COMPARISON:  06/29/2019 FINDINGS: Frontal and lateral views of the chest demonstrate stable enlargement of the cardiac silhouette. Chronic central vascular congestion and interstitial scarring again noted. No airspace disease, effusion, or pneumothorax. No acute bony abnormalities. IMPRESSION: 1. Stable chronic central vascular congestion and interstitial scarring. No acute process. Electronically Signed   By: Randa Ngo M.D.   On: 09/30/2019 00:18   CT Head Wo Contrast  Result Date: 09/30/2019 CLINICAL DATA:  Acute headache, hypertension, atrial fibrillation EXAM: CT HEAD WITHOUT CONTRAST TECHNIQUE: Contiguous axial images were obtained from the base of the skull through the vertex without intravenous contrast. COMPARISON:  06/30/2019 FINDINGS: Brain: Chronic lacunar infarcts are seen within the bilateral basal ganglia and periventricular white matter, stable. No acute infarct or hemorrhage. Lateral ventricles and remaining midline structures are unremarkable. No acute extra-axial fluid collections. No mass effect. Vascular: No hyperdense vessel or unexpected calcification. Skull: Normal. Negative for fracture or focal lesion. Sinuses/Orbits: No acute finding. Other: None. IMPRESSION: 1. Stable chronic small vessel ischemic changes. No acute intracranial process. Electronically Signed   By: Randa Ngo M.D.   On: 09/30/2019 00:03   ECHOCARDIOGRAM COMPLETE  Result Date: 09/30/2019    ECHOCARDIOGRAM REPORT   Patient Name:   WILHELMINA HARK Date of Exam: 09/30/2019 Medical Rec #:  [id number]      Height:       74.0 in Accession #:    [id number]     Weight:       380.0 lb Date of Birth:  [birth year]       BSA:          2.855 m Patient Age:    31 years       BP:           152/88 mmHg Patient Gender: F              HR:           62 bpm. Exam Location:  Inpatient Procedure: 2D Echo, Cardiac Doppler and Color Doppler Indications:    I50.9* Heart failure (unspecified)  History:        Patient has prior history of Echocardiogram examinations, most                 recent 06/04/2018. CHF, Abnormal ECG, COPD, Arrythmias:Atrial                 Fibrillation; Risk Factors:Hypertension, Current Smoker and                 Sleep Apnea.  Sonographer:    Roseanna Rainbow RDCS Referring Phys: [id number] [place]  Sonographer Comments: Technically difficult study due to poor echo windows and patient is morbidly obese. Image acquisition challenging due to patient body habitus. Very difficuly due to habitus and limited ability to turn. [place] interrupted and delayed for phone calls. IMPRESSIONS  1. Left ventricular ejection fraction, by estimation, is 65 to 70%. The left ventricle has hyperdynamic function. The left ventricle has no regional wall motion abnormalities. The left ventricular internal cavity size was mildly dilated. There is mild concentric left ventricular hypertrophy. Left ventricular diastolic parameters are consistent with Grade II diastolic dysfunction (pseudonormalization). Elevated left atrial pressure.  2. Right ventricular systolic function is hyperdynamic. The right ventricular size is normal.  3. Left atrial size was moderately dilated.  4. The mitral valve is normal in structure. Trivial mitral valve regurgitation.  5. The aortic valve is normal in structure. Aortic valve regurgitation is not visualized. Mild aortic valve sclerosis is present, with no evidence of aortic valve stenosis.  6. The inferior vena cava is dilated in size with >50% respiratory variability, suggesting right atrial pressure of 8 mmHg. Comparison(s): Prior images reviewed side by side. The left ventricular function is unchanged. The left  ventricular diastolic function is significantly worse. Findings are consistent ith high output diastolic heart failure, such as obesity cardiomyopathy  (should exclude anemia, thyrotoxicosis, AV fistula, etc.). FINDINGS  Left Ventricle: Left ventricular ejection fraction, by estimation, is 65 to 70%. The left ventricle has hyperdynamic function. The left ventricle has no regional wall motion abnormalities. The left ventricular internal cavity size was mildly dilated. There is mild concentric left ventricular hypertrophy. Left ventricular diastolic parameters are consistent with Grade II diastolic dysfunction (pseudonormalization). Elevated left atrial pressure. Right Ventricle: The right ventricular size is normal. No increase in right ventricular wall thickness. Right ventricular systolic function is hyperdynamic. Left Atrium: Left atrial size was moderately dilated. Right Atrium: Right atrial size was normal in size. Pericardium: There is no evidence of pericardial effusion. Mitral Valve: The mitral valve is normal in structure. Trivial mitral valve regurgitation. Tricuspid Valve: The tricuspid valve is normal in structure. Tricuspid valve regurgitation is not demonstrated. Aortic Valve: The aortic valve is normal in structure. Aortic valve regurgitation is not visualized. Mild aortic valve sclerosis is present, with no evidence of aortic valve stenosis. Pulmonic Valve: The pulmonic valve was grossly normal. Pulmonic valve regurgitation is not visualized. Aorta: The aortic root is normal in size and structure. Venous: The inferior vena cava is dilated in size with greater than 50% respiratory variability, suggesting right atrial pressure of 8 mmHg. IAS/Shunts: No atrial level shunt detected by color flow Doppler.  LEFT VENTRICLE PLAX 2D LVIDd:         6.10 cm      Diastology LVIDs:         3.33 cm      LV e' lateral:  5.00 cm/s LV PW:         1.50 cm      LV E/e' lateral: 18.3 LV IVS:        1.33 cm      LV e'  medial:    5.00 cm/s LVOT diam:     2.30 cm      LV E/e' medial:  18.3 LV SV:         142 LV SV Index:   50 LVOT Area:     4.15 cm  LV Volumes (MOD) LV vol d, MOD A2C: 148.7 ml LV vol d, MOD A4C: 162.1 ml LV vol s, MOD A2C: 36.8 ml LV vol s, MOD A4C: 69.5 ml LV SV MOD A2C:     111.8 ml LV SV MOD A4C:     92.3 ml RIGHT VENTRICLE RV S prime:     12.60 cm/s TAPSE (M-mode): 3.0 cm LEFT ATRIUM             Index       RIGHT ATRIUM           Index LA diam:        4.70 cm 1.65 cm/m  RA Area:     14.30 cm LA Vol (A2C):   59.3 ml 20.77 ml/m RA Volume:   32.20 ml  11.28 ml/m LA Vol (A4C):   96.6 ml 33.83 ml/m LA Biplane Vol: 77.4 ml 27.11 ml/m  AORTIC VALVE LVOT Vmax:   155.00 cm/s LVOT Vmean:  100.000 cm/s LVOT VTI:    0.341 m  AORTA Ao Root diam: 3.40 cm MITRAL VALVE MV Area (PHT): 3.85 cm    SHUNTS MV Decel Time: 197 msec    Systemic VTI:  0.34 m MV E velocity: 91.30 cm/s  Systemic Diam: 2.30 cm MV A velocity: 64.50 cm/s MV E/A ratio:  1.42 Mihai Croitoru MD Electronically signed by Sanda Klein MD Signature Date/Time: 09/30/2019/9:42:43 AM    Final      Assessment & Plan:   No problem-specific Assessment & Plan notes found for this encounter.     Martyn Ehrich, NP 10/08/2019

## 2019-10-09 ENCOUNTER — Encounter: Payer: Self-pay | Admitting: Internal Medicine

## 2019-10-09 ENCOUNTER — Ambulatory Visit (INDEPENDENT_AMBULATORY_CARE_PROVIDER_SITE_OTHER): Payer: Medicare Other | Admitting: Internal Medicine

## 2019-10-09 VITALS — BP 116/49 | HR 60 | Temp 98.9°F | Wt 362.4 lb

## 2019-10-09 DIAGNOSIS — I5033 Acute on chronic diastolic (congestive) heart failure: Secondary | ICD-10-CM

## 2019-10-09 DIAGNOSIS — I10 Essential (primary) hypertension: Secondary | ICD-10-CM | POA: Diagnosis not present

## 2019-10-09 DIAGNOSIS — G894 Chronic pain syndrome: Secondary | ICD-10-CM

## 2019-10-09 MED ORDER — TORSEMIDE 20 MG PO TABS: 40.0000 mg | ORAL_TABLET | Freq: Every day | ORAL | Status: DC

## 2019-10-09 MED ORDER — TORSEMIDE 20 MG PO TABS: 60.0000 mg | ORAL_TABLET | Freq: Every day | ORAL | Status: DC

## 2019-10-09 MED ORDER — HYDRALAZINE HCL 25 MG PO TABS: 25.0000 mg | ORAL_TABLET | Freq: Three times a day (TID) | ORAL | 2 refills | Status: DC

## 2019-10-09 NOTE — Assessment & Plan Note (Signed)
Follows with pain clinic. On percocet 5-325mg  #180 every 30 days. Mentions that her provider at [facility] was on vacation and endorse dissatisfaction with current dose. Advised to f/u with pain clinic.  - F/u w/ pain clinic

## 2019-10-09 NOTE — Patient Instructions (Signed)
Thank you for allowing [place] to provide your care today. Today we discussed your weight and diuretics  I have ordered bmp labs for you. I will call if any are abnormal.    Today we made the following changes to your medications.    Please reduce your hydralazine to 25mg   Please continue to take your diuretics as prescribed  Please follow-up in 2 weeks.    Should you have any questions or concerns please call the internal medicine clinic at [phone].     Low-Sodium Eating Plan Sodium, which is an element that makes up salt, helps you maintain a healthy balance of fluids in your body. Too much sodium can increase your blood pressure and cause fluid and waste to be held in your body. Your health care provider or dietitian may recommend following this plan if you have high blood pressure (hypertension), kidney disease, liver disease, or heart failure. Eating less sodium can help lower your blood pressure, reduce swelling, and protect your heart, liver, and kidneys. What are tips for following this plan? General guidelines  Most people on this plan should limit their sodium intake to 1,500-2,000 mg (milligrams) of sodium each day. Reading food labels   The Nutrition Facts label lists the amount of sodium in one serving of the food. If you eat more than one serving, you must multiply the listed amount of sodium by the number of servings.  Choose foods with less than 140 mg of sodium per serving.  Avoid foods with 300 mg of sodium or more per serving. Shopping  Look for lower-sodium products, often labeled as "low-sodium" or "no salt added."  Always check the sodium content even if foods are labeled as "unsalted" or "no salt added".  Buy fresh foods. ? Avoid canned foods and premade or frozen meals. ? Avoid canned, cured, or processed meats  Buy breads that have less than 80 mg of sodium per slice. Cooking  Eat more home-cooked food and less restaurant, buffet, and fast  food.  Avoid adding salt when cooking. Use salt-free seasonings or herbs instead of table salt or sea salt. Check with your health care provider or pharmacist before using salt substitutes.  Cook with plant-based oils, such as canola, sunflower, or olive oil. Meal planning  When eating at a restaurant, ask that your food be prepared with less salt or no salt, if possible.  Avoid foods that contain MSG (monosodium glutamate). MSG is sometimes added to [place] food, bouillon, and some canned foods. What foods are recommended? The items listed may not be a complete list. Talk with your dietitian about what dietary choices are best for you. Grains Low-sodium cereals, including oats, puffed wheat and rice, and shredded wheat. Low-sodium crackers. Unsalted rice. Unsalted pasta. Low-sodium bread. Whole-grain breads and whole-grain pasta. Vegetables Fresh or frozen vegetables. "No salt added" canned vegetables. "No salt added" tomato sauce and paste. Low-sodium or reduced-sodium tomato and vegetable juice. Fruits Fresh, frozen, or canned fruit. Fruit juice. Meats and other protein foods Fresh or frozen (no salt added) meat, poultry, seafood, and fish. Low-sodium canned tuna and salmon. Unsalted nuts. Dried peas, beans, and lentils without added salt. Unsalted canned beans. Eggs. Unsalted nut butters. Dairy Milk. Soy milk. Cheese that is naturally low in sodium, such as ricotta cheese, fresh mozzarella, or Swiss cheese Low-sodium or reduced-sodium cheese. Cream cheese. Yogurt. Fats and oils Unsalted butter. Unsalted margarine with no trans fat. Vegetable oils such as canola or olive oils. Seasonings and other foods Fresh  and dried herbs and spices. Salt-free seasonings. Low-sodium mustard and ketchup. Sodium-free salad dressing. Sodium-free light mayonnaise. Fresh or refrigerated horseradish. Lemon juice. Vinegar. Homemade, reduced-sodium, or low-sodium soups. Unsalted popcorn and pretzels. Low-salt  or salt-free chips. What foods are not recommended? The items listed may not be a complete list. Talk with your dietitian about what dietary choices are best for you. Grains Instant hot cereals. Bread stuffing, pancake, and biscuit mixes. Croutons. Seasoned rice or pasta mixes. Noodle soup cups. Boxed or frozen macaroni and cheese. Regular salted crackers. Self-rising flour. Vegetables Sauerkraut, pickled vegetables, and relishes. Olives. [place] fries. Onion rings. Regular canned vegetables (not low-sodium or reduced-sodium). Regular canned tomato sauce and paste (not low-sodium or reduced-sodium). Regular tomato and vegetable juice (not low-sodium or reduced-sodium). Frozen vegetables in sauces. Meats and other protein foods Meat or fish that is salted, canned, smoked, spiced, or pickled. Bacon, ham, sausage, hotdogs, corned beef, chipped beef, packaged lunch meats, salt pork, jerky, pickled herring, anchovies, regular canned tuna, sardines, salted nuts. Dairy Processed cheese and cheese spreads. Cheese curds. Blue cheese. Feta cheese. String cheese. Regular cottage cheese. Buttermilk. Canned milk. Fats and oils Salted butter. Regular margarine. Ghee. Bacon fat. Seasonings and other foods Onion salt, garlic salt, seasoned salt, table salt, and sea salt. Canned and packaged gravies. Worcestershire sauce. Tartar sauce. Barbecue sauce. Teriyaki sauce. Soy sauce, including reduced-sodium. Steak sauce. Fish sauce. Oyster sauce. Cocktail sauce. Horseradish that you find on the shelf. Regular ketchup and mustard. Meat flavorings and tenderizers. Bouillon cubes. Hot sauce and Tabasco sauce. Premade or packaged marinades. Premade or packaged taco seasonings. Relishes. Regular salad dressings. Salsa. Potato and tortilla chips. Corn chips and puffs. Salted popcorn and pretzels. Canned or dried soups. Pizza. Frozen entrees and pot pies. Summary  Eating less sodium can help lower your blood pressure, reduce  swelling, and protect your heart, liver, and kidneys.  Most people on this plan should limit their sodium intake to 1,500-2,000 mg (milligrams) of sodium each day.  Canned, boxed, and frozen foods are high in sodium. Restaurant foods, fast foods, and pizza are also very high in sodium. You also get sodium by adding salt to food.  Try to cook at home, eat more fresh fruits and vegetables, and eat less fast food, canned, processed, or prepared foods. This information is not intended to replace advice given to you by your health care provider. Make sure you discuss any questions you have with your health care provider. Document Revised: 03/02/2017 Document Reviewed: 03/13/2016 Elsevier Patient Education  2020 [employer].

## 2019-10-09 NOTE — Assessment & Plan Note (Addendum)
Ms.Umana is a 69 yo F w/ PMH of HTn, PVD, morbid obesity, chronic pain, PAF on Xarelto and COPD presenting to [facility] after recent hospitalization for acute on chronic diastolic heart failure. She was noted to be in significant pulmonary distress with hypervolemia and had a 3 day stay for IV diuresis. Since then, she mentions continuing to take her diuretics and having good response. She mentions being annoyed due to the frequency of urination caused by her torsemide. She denies any dyspnea, palpitations, chest pain, productive cough. She mentions some muscle cramps of her left shoulder but mentions this is chronic.  A/P Present for f/u after hospitalization for diastolic heart failure. Volume appear much improved. Weight continues to decrease from discharge 170kg->164.4kg at this clinic visit. Last known dry weight 156kg. Discharge paperwork states torsemide 60mg  daily but mentions that she is taking 40mg  at morning, 20mg  at night. Since continuing to have good response w/ 40mg , will recommend 40mg  daily. BMP today  - Check bmp - C/w torsemide 40mg  daily - Advised to up-titrate to 40mg  bid if weight begins to rise again - F/u in 2 weeks

## 2019-10-09 NOTE — Progress Notes (Signed)
CC: Leg swelling  HPI: [name] is a 69 y.o. with PMH listed below presenting with complaint of leg swelling. Please see problem based assessment and plan for further details.  Past Medical History:  Diagnosis Date  . A-fib ([place])   . Anxiety   . Arthritis    "qwhre; joints, back" (04/17/2017)  . Benign breast cyst in female, left 01/08/2017   Found by Screening mammogram, evaluated by U/S on 01/08/17 and determined to be a benign simple breast cyst.  . Cellulitis of left lower leg 05/30/2017  . CHF (congestive heart failure) ([place])   . Chronic low back pain 08/21/2016  . Chronic lower back pain   . Chronic venous insufficiency    Sharon Vasquez 05/30/2017  . COPD (chronic obstructive pulmonary disease) ([place])   . Depression   . DVT (deep venous thrombosis) ([place]) 11/16/2016  . GERD (gastroesophageal reflux disease)   . Headache    "weekly for the last 3 months" (04/17/2017)  . Hyperlipidemia   . Hypertension   . Morbid obesity ([place])   . PE (pulmonary embolism)   . Pulmonary embolism ([place]) 09/21/2014    Review of Systems: Review of Systems  Constitutional: Negative for chills, fever and malaise/fatigue.  Eyes: Negative for blurred vision.  Respiratory: Negative for cough and shortness of breath.   Cardiovascular: Positive for orthopnea and leg swelling. Negative for chest pain and palpitations.  Gastrointestinal: Negative for constipation, diarrhea, nausea and vomiting.  Genitourinary: Positive for frequency. Negative for dysuria and urgency.  Musculoskeletal: Positive for back pain, joint pain and myalgias.  Neurological: Positive for headaches. Negative for dizziness, sensory change and focal weakness.     Physical Exam: Vitals:   10/09/19 1033  BP: (!) 116/49  Pulse: 60  Temp: 98.9 F (37.2 C)  TempSrc: Oral  SpO2: 96%  Weight: (!) 362 lb 6.4 oz (164.4 kg)    Physical Exam Constitutional:      General: She is not in acute distress.    Appearance: She is obese.  She is ill-appearing (chronically ill-appearing).  HENT:     Mouth/Throat:     Mouth: Mucous membranes are moist.     Pharynx: Oropharynx is clear.  Eyes:     Conjunctiva/sclera: Conjunctivae normal.  Cardiovascular:     Rate and Rhythm: Normal rate and regular rhythm.     Pulses: Normal pulses.     Heart sounds: Normal heart sounds. No murmur heard.  No gallop.   Pulmonary:     Effort: Pulmonary effort is normal.     Breath sounds: Normal breath sounds. No wheezing or rales.  Abdominal:     General: Abdomen is flat. Bowel sounds are normal. There is no distension.     Palpations: Abdomen is soft.     Tenderness: There is no abdominal tenderness.  Musculoskeletal:        General: Swelling (2+ pitting edema up to mid shin) and tenderness (L shoulder tenderness on deep palpation. Acitve/passive ROM intact) present. Normal range of motion.     Cervical back: Normal range of motion.  Skin:    General: Skin is warm and dry.     Comments: Bilateral lower extremity chronic venous stasis changes  Neurological:     Mental Status: She is alert.     Assessment & Plan:   Chronic pain syndrome Follows with pain clinic. On percocet 5-325mg  #180 every 30 days. Mentions that her provider at [facility] was on vacation and endorse dissatisfaction with current dose.  Advised to f/u with pain clinic.  - F/u w/ pain clinic  Benign essential HTN BP Readings from Last 3 Encounters:  10/09/19 (!) 116/49  10/02/19 (!) 126/59  09/18/19 120/86   BP at goal. Previously chronically elevated. Appear to have significantly improved after diuresis in the hospital. Currently on hydralazine 50mg  TID, benazepril 40mg , metoprolol succinate 100mg . Suspect bp may be over-treated while getting closer to dry weight.  - Decrease hydralazien to 25mg  TID - C/w benazepril 40mg , metoprolol succinate 100mg  daily  (HFpEF) heart failure with preserved ejection fraction ([place]) Sharon Vasquez is a 69 yo F w/ PMH of HTn, PVD,  morbid obesity, chronic pain, PAF on Xarelto and COPD presenting to [facility] after recent hospitalization for acute on chronic diastolic heart failure. She was noted to be in significant pulmonary distress with hypervolemia and had a 3 day stay for IV diuresis. Since then, she mentions continuing to take her diuretics and having good response. She mentions being annoyed due to the frequency of urination caused by her torsemide. She denies any dyspnea, palpitations, chest pain, productive cough. She mentions some muscle cramps of her left shoulder but mentions this is chronic.  A/P Present for f/u after hospitalization for diastolic heart failure. Volume appear much improved. Weight continues to decrease from discharge 170kg->164.4kg at this clinic visit. Last known dry weight 156kg. Discharge paperwork states torsemide 60mg  daily but mentions that she is taking 40mg  at morning, 20mg  at night. Since continuing to have good response w/ 40mg , will recommend 40mg  daily. BMP today  - Check bmp - C/w torsemide 40mg  daily - Advised to up-titrate to 40mg  bid if weight begins to rise again - F/u in 2 weeks    Patient discussed with [place], [facility] Pager: [phone]

## 2019-10-09 NOTE — Assessment & Plan Note (Addendum)
BP Readings from Last 3 Encounters:  10/09/19 (!) 116/49  10/02/19 (!) 126/59  09/18/19 120/86   BP at goal. Previously chronically elevated. Appear to have significantly improved after diuresis in the hospital. Currently on hydralazine 50mg  TID, benazepril 40mg , metoprolol succinate 100mg . Suspect bp may be over-treated while getting closer to dry weight.  - Decrease hydralazien to 25mg  TID - C/w benazepril 40mg , metoprolol succinate 100mg  daily

## 2019-10-09 NOTE — Assessment & Plan Note (Signed)
>>  ASSESSMENT AND PLAN FOR DIASTOLIC HEART FAILURE (HCC) WRITTEN ON 10/09/2019  6:25 PM BY LEE, Artist Pais, MD  Sharon Vasquez is a 69 yo F w/ PMH of HTn, PVD, morbid obesity, chronic pain, PAF on Xarelto and COPD presenting to [facility] after recent hospitalization for acute on chronic diastolic heart failure. She was noted to be in significant pulmonary distress with hypervolemia and had a 3 day stay for IV diuresis. Since then, she mentions continuing to take her diuretics and having good response. She mentions being annoyed due to the frequency of urination caused by her torsemide. She denies any dyspnea, palpitations, chest pain, productive cough. She mentions some muscle cramps of her left shoulder but mentions this is chronic.  A/P Present for f/u after hospitalization for diastolic heart failure. Volume appear much improved. Weight continues to decrease from discharge 170kg->164.4kg at this clinic visit. Last known dry weight 156kg. Discharge paperwork states torsemide 60mg  daily but mentions that she is taking 40mg  at morning, 20mg  at night. Since continuing to have good response w/ 40mg , will recommend 40mg  daily. BMP today  - Check bmp - C/w torsemide 40mg  daily - Advised to up-titrate to 40mg  bid if weight begins to rise again - F/u in 2 weeks

## 2019-10-10 ENCOUNTER — Ambulatory Visit
Admission: RE | Admit: 2019-10-10 | Discharge: 2019-10-10 | Disposition: A | Discharge status: Home / Self Care | Payer: Medicare Other | Source: Ambulatory Visit | Attending: Pulmonary Disease | Admitting: Pulmonary Disease

## 2019-10-10 DIAGNOSIS — R0609 Other forms of dyspnea: Secondary | ICD-10-CM

## 2019-10-10 DIAGNOSIS — R0600 Dyspnea, unspecified: Secondary | ICD-10-CM

## 2019-10-10 DIAGNOSIS — J439 Emphysema, unspecified: Secondary | ICD-10-CM

## 2019-10-10 DIAGNOSIS — J849 Interstitial pulmonary disease, unspecified: Secondary | ICD-10-CM

## 2019-10-10 DIAGNOSIS — I77810 Thoracic aortic ectasia: Secondary | ICD-10-CM | POA: Diagnosis not present

## 2019-10-10 DIAGNOSIS — J432 Centrilobular emphysema: Secondary | ICD-10-CM | POA: Diagnosis not present

## 2019-10-10 LAB — BMP8+ANION GAP
Anion Gap: 17 mmol/L (ref 10.0–18.0)
BUN/Creatinine Ratio: 21 (ref 12–28)
BUN: 27 mg/dL (ref 8–27)
CO2: 22 mmol/L (ref 20–29)
Calcium: 9.7 mg/dL (ref 8.7–10.3)
Chloride: 104 mmol/L (ref 96–106)
Creatinine, Ser: 1.26 mg/dL — ABNORMAL HIGH (ref 0.57–1.00)
GFR calc Af Amer: 50 mL/min/{1.73_m2} — ABNORMAL LOW (ref >59)
GFR calc non Af Amer: 43 mL/min/{1.73_m2} — ABNORMAL LOW (ref >59)
Glucose: 102 mg/dL — ABNORMAL HIGH (ref 65–99)
Potassium: 3.8 mmol/L (ref 3.5–5.2)
Sodium: 143 mmol/L (ref 134–144)

## 2019-10-10 NOTE — Progress Notes (Signed)
Internal Medicine Clinic Attending  Case discussed with Dr. Lee  At the time of the visit.  We reviewed the resident's history and exam and pertinent patient test results.  I agree with the assessment, diagnosis, and plan of care documented in the resident's note.    

## 2019-10-14 NOTE — Telephone Encounter (Signed)
Lm for rtc 

## 2019-10-17 ENCOUNTER — Ambulatory Visit: Payer: Self-pay | Admitting: *Deleted

## 2019-10-17 ENCOUNTER — Telehealth: Payer: Medicare Other

## 2019-10-17 DIAGNOSIS — M62838 Other muscle spasm: Secondary | ICD-10-CM | POA: Diagnosis not present

## 2019-10-17 DIAGNOSIS — M6281 Muscle weakness (generalized): Secondary | ICD-10-CM | POA: Diagnosis not present

## 2019-10-17 NOTE — Chronic Care Management (AMB) (Signed)
  Chronic Care Management   Outreach Note  10/17/2019 Name: Sharon Vasquez [name]: [id number] DOB: 07-Jul-1950  Referred by: Andrew Au, MD Reason for referral : Chronic Care Management (COPD, HF, HTN, PAF, PE)   An unsuccessful telephone outreach was attempted today. The patient was referred to the case management team for assistance with care management and care coordination. This CCM RN was returning call to patient as she is asking about securing [employer] Urine Collection System for home use.   Follow Up Plan: A HIPPA compliant phone message was left for the patient providing contact information and requesting a return call. Also included in message that his CCM RN can assist with securing order and necessary documentation from her provider for the [place] system but patient was asked to call [facility] Medicare customer service to determine the name of the contracted medical supplier for the system so that the orders and necessary documentation can be sent to the appropriate medial supplier.  Await return call from patient.  Kelli Churn RN, CCM, [facility] RN Care Manager [phone]

## 2019-10-20 ENCOUNTER — Ambulatory Visit: Payer: Medicare Other | Admitting: *Deleted

## 2019-10-20 DIAGNOSIS — J439 Emphysema, unspecified: Secondary | ICD-10-CM

## 2019-10-20 DIAGNOSIS — I5033 Acute on chronic diastolic (congestive) heart failure: Secondary | ICD-10-CM

## 2019-10-20 DIAGNOSIS — I10 Essential (primary) hypertension: Secondary | ICD-10-CM

## 2019-10-20 DIAGNOSIS — J449 Chronic obstructive pulmonary disease, unspecified: Secondary | ICD-10-CM

## 2019-10-20 NOTE — Chronic Care Management (AMB) (Signed)
  Chronic Care Management   Note  10/20/2019 Name: Sharon Vasquez [name]: [id number] DOB: May 04, 1950  Spoke with patient after she returned call to this CCM RN's message left on 7/16. Patient states the PureWick urine collection system is no longer covered by Medicare so she will have to pay privately. She voiced appreciation for this CCM RN's willingness to help her secure the system.  Follow up plan: The care management team will reach out to the patient again over the next 30-60 days.   Kelli Churn RN, CCM, [facility] RN Care Manager [phone]

## 2019-10-22 ENCOUNTER — Telehealth: Payer: Self-pay

## 2019-10-22 NOTE — Telephone Encounter (Signed)
Mailed scat form back to patient.

## 2019-10-23 ENCOUNTER — Encounter: Payer: Self-pay | Admitting: Internal Medicine

## 2019-10-23 ENCOUNTER — Ambulatory Visit (INDEPENDENT_AMBULATORY_CARE_PROVIDER_SITE_OTHER): Payer: Medicare Other | Admitting: Internal Medicine

## 2019-10-23 ENCOUNTER — Other Ambulatory Visit: Payer: Self-pay

## 2019-10-23 VITALS — BP 122/66 | HR 59 | Temp 98.0°F | Ht 74.0 in | Wt 376.6 lb

## 2019-10-23 DIAGNOSIS — Z6841 Body Mass Index (BMI) 40.0 and over, adult: Secondary | ICD-10-CM | POA: Diagnosis not present

## 2019-10-23 DIAGNOSIS — M170 Bilateral primary osteoarthritis of knee: Secondary | ICD-10-CM

## 2019-10-23 DIAGNOSIS — E669 Obesity, unspecified: Secondary | ICD-10-CM

## 2019-10-23 DIAGNOSIS — I5032 Chronic diastolic (congestive) heart failure: Secondary | ICD-10-CM | POA: Diagnosis not present

## 2019-10-23 DIAGNOSIS — I5033 Acute on chronic diastolic (congestive) heart failure: Secondary | ICD-10-CM | POA: Diagnosis not present

## 2019-10-23 NOTE — Patient Instructions (Signed)
[name], It was nice seeing you.   Today we discussed your heart failure. I am glad you do not feel like you have fluid on you, but I do worry about your weights going back up.  Try to keep as close of an eye on your weight as you can at home and take the Torsemide 40 mg daily.   We will keep a close eye on things and have you come back in 2 weeks to re-evaluate. We will make medication adjustments at that time if we need to.   I will check your kidney function today to ensure things are stable.   Take care, Dr. Koleen Distance

## 2019-10-24 ENCOUNTER — Encounter: Payer: Self-pay | Admitting: Cardiovascular Disease

## 2019-10-24 ENCOUNTER — Encounter: Payer: Self-pay | Admitting: Internal Medicine

## 2019-10-24 ENCOUNTER — Telehealth: Payer: Self-pay | Admitting: Pulmonary Disease

## 2019-10-24 ENCOUNTER — Ambulatory Visit (INDEPENDENT_AMBULATORY_CARE_PROVIDER_SITE_OTHER): Payer: Medicare Other | Admitting: Cardiovascular Disease

## 2019-10-24 DIAGNOSIS — I2510 Atherosclerotic heart disease of native coronary artery without angina pectoris: Secondary | ICD-10-CM

## 2019-10-24 DIAGNOSIS — I5032 Chronic diastolic (congestive) heart failure: Secondary | ICD-10-CM

## 2019-10-24 DIAGNOSIS — M6281 Muscle weakness (generalized): Secondary | ICD-10-CM | POA: Diagnosis not present

## 2019-10-24 DIAGNOSIS — J439 Emphysema, unspecified: Secondary | ICD-10-CM | POA: Diagnosis not present

## 2019-10-24 DIAGNOSIS — Z720 Tobacco use: Secondary | ICD-10-CM

## 2019-10-24 DIAGNOSIS — J849 Interstitial pulmonary disease, unspecified: Secondary | ICD-10-CM

## 2019-10-24 DIAGNOSIS — I480 Paroxysmal atrial fibrillation: Secondary | ICD-10-CM

## 2019-10-24 DIAGNOSIS — I10 Essential (primary) hypertension: Secondary | ICD-10-CM | POA: Diagnosis not present

## 2019-10-24 DIAGNOSIS — G4733 Obstructive sleep apnea (adult) (pediatric): Secondary | ICD-10-CM

## 2019-10-24 DIAGNOSIS — M62838 Other muscle spasm: Secondary | ICD-10-CM | POA: Diagnosis not present

## 2019-10-24 DIAGNOSIS — Z86711 Personal history of pulmonary embolism: Secondary | ICD-10-CM

## 2019-10-24 LAB — BMP8+ANION GAP
Anion Gap: 16 mmol/L (ref 10.0–18.0)
BUN/Creatinine Ratio: 19 (ref 12–28)
BUN: 16 mg/dL (ref 8–27)
CO2: 20 mmol/L (ref 20–29)
Calcium: 9.3 mg/dL (ref 8.7–10.3)
Chloride: 108 mmol/L — ABNORMAL HIGH (ref 96–106)
Creatinine, Ser: 0.86 mg/dL (ref 0.57–1.00)
GFR calc Af Amer: 79 mL/min/{1.73_m2} (ref >59)
GFR calc non Af Amer: 69 mL/min/{1.73_m2} (ref >59)
Glucose: 97 mg/dL (ref 65–99)
Potassium: 4.5 mmol/L (ref 3.5–5.2)
Sodium: 144 mmol/L (ref 134–144)

## 2019-10-24 NOTE — Progress Notes (Signed)
Acute Office Visit  Subjective:    Patient ID: Sharon Vasquez, female    DOB: Apr 26, 1950, 69 y.o.   [name]: [id number]  Chief Complaint  Patient presents with  . Follow-up  . Medication Problem    HPI Patient is in today for follow-up on HFpEF. Please see problem based charting for further details.   Past Medical History:  Diagnosis Date  . A-fib ([place])   . Anxiety   . Arthritis    "qwhre; joints, back" (04/17/2017)  . Benign breast cyst in female, left 01/08/2017   Found by Screening mammogram, evaluated by U/S on 01/08/17 and determined to be a benign simple breast cyst.  . Cellulitis of left lower leg 05/30/2017  . CHF (congestive heart failure) ([place])   . Chronic low back pain 08/21/2016  . Chronic lower back pain   . Chronic venous insufficiency    [name] 05/30/2017  . COPD (chronic obstructive pulmonary disease) ([place])   . Depression   . DVT (deep venous thrombosis) ([place]) 11/16/2016  . GERD (gastroesophageal reflux disease)   . Headache    "weekly for the last 3 months" (04/17/2017)  . Hyperlipidemia   . Hypertension   . Morbid obesity ([place])   . PE (pulmonary embolism)   . Pulmonary embolism ([place]) 09/21/2014    Past Surgical History:  Procedure Laterality Date  . ABDOMINAL HYSTERECTOMY    . APPENDECTOMY    . BREAST CYST EXCISION Left    "six o'clock"  . BREAST LUMPECTOMY Left   . CHOLECYSTECTOMY    . DILATION AND CURETTAGE OF UTERUS    . IR ABLATE LIVER CRYOABLATION  07/23/2019  . IR RADIOLOGIST EVAL & MGMT  07/18/2019  . TONSILLECTOMY AND ADENOIDECTOMY    . TUBAL LIGATION      Family History  Problem Relation Age of Onset  . Breast cancer Mother   . Hypertension Mother   . Hyperlipidemia Mother   . Hypertension Maternal Grandfather   . Hyperlipidemia Maternal Grandfather     Social History   Socioeconomic History  . Marital status: Divorced    Spouse name: Not on file  . Number of children: Not on file  . Years of education: Not on file  . Highest  education level: Not on file  Occupational History  . Not on file  Tobacco Use  . Smoking status: Current Some Day Smoker    Packs/day: 0.10    Years: 55.00    Pack years: 5.50    Types: Cigarettes    Start date: 08/16/1961  . Smokeless tobacco: Never Used  . Tobacco comment: 2 cigs per day  Vaping Use  . Vaping Use: Never used  Substance and Sexual Activity  . Alcohol use: No  . Drug use: No  . Sexual activity: Not Currently    Partners: Male  Other Topics Concern  . Not on file  Social History Narrative   Lives in [facility] in [place]. Lives alone, but is dependent in ADLs/IADLs. Previously had [employer] [facility] and RN but dismissed them with plans to use the [employer]. Two daughters live nearby. Previously resided in [place].   Social Determinants of Health   Financial Resource Strain: Medium Risk  . Difficulty of Paying Living Expenses: Somewhat hard  Food Insecurity: No Food Insecurity  . Worried About Charity fundraiser in the Last Year: Never true  . Ran Out of Food in the Last Year: Never true  Transportation Needs: No Transportation Needs  .  Lack of Transportation (Medical): No  . Lack of Transportation (Non-Medical): No  Physical Activity:   . Days of Exercise per Week:   . Minutes of Exercise per Session:   Stress:   . Feeling of Stress :   Social Connections:   . Frequency of Communication with Friends and Family:   . Frequency of Social Gatherings with Friends and Family:   . Attends Religious Services:   . Active Member of Clubs or Organizations:   . Attends Archivist Meetings:   [name] Marital Status:   Intimate Partner Violence:   . Fear of Current or Ex-Partner:   . Emotionally Abused:   [name] Physically Abused:   . Sexually Abused:     Outpatient Medications Prior to Visit  Medication Sig Dispense Refill  . albuterol (PROAIR HFA) 108 (90 Base) MCG/ACT inhaler Inhale 2 puffs into the lungs every 6 (six) hours as needed for wheezing or  shortness of breath. 8 g 3  . albuterol (PROVENTIL) (2.5 MG/3ML) 0.083% nebulizer solution TAKE 3 MLS(1 AMPULE) BY NEBULIZATION EVERY 4 HOURS AS NEEDED FOR WHHEZING OR SHORTNESS OF BREATH (Patient taking differently: Take 2.5 mg by nebulization every 4 (four) hours as needed for wheezing or shortness of breath. ) 75 mL 2  . amiodarone (PACERONE) 200 MG tablet Take 0.5 tablets (100 mg total) by mouth daily. 45 tablet 3  . aspirin EC 81 MG tablet Take 81 mg by mouth daily.    [name] atorvastatin (LIPITOR) 40 MG tablet Take 1 tablet (40 mg total) by mouth daily. 90 tablet 3  . benazepril (LOTENSIN) 40 MG tablet Take 1 tablet (40 mg total) by mouth daily. 90 tablet 3  . esomeprazole (NEXIUM) 40 MG capsule Take 1 capsule (40 mg total) by mouth daily as needed (for heartburn or indigestion). 90 capsule 1  . Fluticasone-Salmeterol (ADVAIR DISKUS) 250-50 MCG/DOSE AEPB Inhale 1 puff into the lungs 2 (two) times daily. Rinse mouth after each use 60 each 6  . gabapentin (NEURONTIN) 400 MG capsule Take 1 capsule (400 mg total) by mouth 2 (two) times daily. 180 capsule 0  . hydrALAZINE (APRESOLINE) 25 MG tablet Take 1 tablet (25 mg total) by mouth 3 (three) times daily. 180 tablet 2  . metoprolol succinate (TOPROL-XL) 100 MG 24 hr tablet TAKE 1 TABLET(100 MG) BY MOUTH DAILY WITH OR IMMEDIATELY FOLLOWING A MEAL 30 tablet 5  . oxyCODONE-acetaminophen (PERCOCET/ROXICET) 5-325 MG tablet Take 1 tablet by mouth daily as needed for severe pain. 5 tablet 0  . potassium chloride (KLOR-CON) 10 MEQ tablet TAKE 2 TABLETS(20 MEQ) BY MOUTH TWICE DAILY (Patient taking differently: Take 20 mEq by mouth 2 (two) times daily. ) 360 tablet 1  . rivaroxaban (XARELTO) 20 MG TABS tablet Take 1 tablet (20 mg total) by mouth every morning. 90 tablet 3  . SPIRIVA HANDIHALER 18 MCG inhalation capsule PLACE 1 CAPSULE INTO INHALER AND INHALE DAILY (Patient taking differently: Place 18 mcg into inhaler and inhale daily. ) 30 capsule 6  . torsemide  (DEMADEX) 20 MG tablet Take 2 tablets (40 mg total) by mouth daily.     No facility-administered medications prior to visit.    No Known Allergies  Review of Systems  Constitutional: Negative for activity change and fatigue.  Respiratory: Negative for cough and shortness of breath.   Cardiovascular: Negative for chest pain and palpitations.  Gastrointestinal: Negative for abdominal distention.  Genitourinary: Negative for difficulty urinating.  Neurological: Negative for weakness, light-headedness and headaches.  Objective:    Physical Exam Constitutional:      General: She is not in acute distress.    Appearance: Normal appearance. She is obese.  Eyes:     Conjunctiva/sclera: Conjunctivae normal.  Cardiovascular:     Rate and Rhythm: Normal rate and regular rhythm.     Comments: Chronic venous stasis changes of bilateral lower extremities but no pitting edema.  Pulmonary:     Effort: Pulmonary effort is normal.     Breath sounds: Normal breath sounds.  Abdominal:     General: There is no distension.     Palpations: Abdomen is soft.  Skin:    General: Skin is warm and dry.  Neurological:     General: No focal deficit present.     Mental Status: She is alert and oriented to person, place, and time.  Psychiatric:        Mood and Affect: Mood normal.        Behavior: Behavior normal.     BP 122/66 (BP Location: Left Arm, Patient Position: Sitting, Cuff Size: Large)   Pulse (!) 59   Temp 98 F (36.7 C) (Oral)   Ht 6\' 2"  (1.88 m)   Wt (!) 376 lb 9.6 oz (170.8 kg)   SpO2 96% Comment: room air  BMI 48.35 kg/m  Wt Readings from Last 3 Encounters:  10/23/19 (!) 376 lb 9.6 oz (170.8 kg)  10/09/19 (!) 362 lb 6.4 oz (164.4 kg)  10/02/19 (!) 374 lb 12.8 oz (170 kg)    Health Maintenance Due  Topic Date Due  . PNA vac Low Risk Adult (2 of 2 - PPSV23) 02/01/2018    There are no preventive care reminders to display for this patient.   Lab Results  Component  Value Date   TSH 1.609 05/30/2017   Lab Results  Component Value Date   WBC 7.7 09/29/2019   HGB 10.9 (L) 09/30/2019   HCT 32.0 (L) 09/30/2019   MCV 89.6 09/29/2019   PLT 351 09/29/2019   Lab Results  Component Value Date   NA 144 10/23/2019   K 4.5 10/23/2019   CO2 20 10/23/2019   GLUCOSE 97 10/23/2019   BUN 16 10/23/2019   CREATININE 0.86 10/23/2019   BILITOT 0.5 09/29/2019   ALKPHOS 83 09/29/2019   AST 15 09/29/2019   ALT 21 09/29/2019   PROT 6.9 09/29/2019   ALBUMIN 3.5 09/29/2019   CALCIUM 9.3 10/23/2019   ANIONGAP 10 10/02/2019   Lab Results  Component Value Date   CHOL 154 07/19/2017   Lab Results  Component Value Date   HDL 48 07/19/2017   Lab Results  Component Value Date   LDLCALC 86 07/19/2017   Lab Results  Component Value Date   TRIG 99 07/19/2017   Lab Results  Component Value Date   CHOLHDL 3.2 07/19/2017   Lab Results  Component Value Date   HGBA1C 5.9 (H) 07/19/2017       Assessment & Plan:   Problem List Items Addressed This Visit      Cardiovascular and Mediastinum   (HFpEF) heart failure with preserved ejection fraction ([place]) - Primary (Chronic)    Sharon Vasquez presents for close follow-up due to recent hospitalization for acute on chronic HFpEF. Her weight today is up 6 kg from 2 weeks ago. However, she denies any symptoms of volume overload. Her exam today is consistent with this. She confesses that she is not compliant with Torsemide every day. Due to how often  she urinates, she will often not take it if she is going out for the day. Even when she is at home, she has a difficult time making it to the bathroom that often due to her limited mobility and will frequently soil her clothes and sheets. She is hopeful to get a purewick at the beginning of next month to help with this issue.  Although she does not appear overtly volume overloaded today, her weight trend is certainly concerning. I advised her to keep a close eye on her weights at  home and to do whatever she can to take the torsemide every day without missing a dose.  BMP today shows stable function and electrolytes. Will continue current regimen.  Follow-up closely in another 2 weeks to ensure her volume status is remaining stable.        Other Visit Diagnoses    Chronic heart failure with preserved ejection fraction (HFpEF) ([place])       Relevant Orders   BMP8+Anion Gap (Completed)       No orders of the defined types were placed in this encounter.    Delice Bison, DO

## 2019-10-24 NOTE — Assessment & Plan Note (Addendum)
Sharon Vasquez presents for close follow-up due to recent hospitalization for acute on chronic HFpEF. Her weight today is up 6 kg from 2 weeks ago. However, she denies any symptoms of volume overload. Her exam today is consistent with this. She confesses that she is not compliant with Torsemide every day. Due to how often she urinates, she will often not take it if she is going out for the day. Even when she is at home, she has a difficult time making it to the bathroom that often due to her limited mobility and will frequently soil her clothes and sheets. She is hopeful to get a purewick at the beginning of next month to help with this issue. Patient would also benefit from a motorized scooter to assist with mobility to better perform ADLS, including toileting, feeding and dressing. She is not able to use a cane or walker due to chronic pain related to OA of both knees.  Although she does not appear overtly volume overloaded today, her weight trend is certainly concerning. I advised her to keep a close eye on her weights at home and to do whatever she can to take the torsemide every day without missing a dose.  BMP today shows stable function and electrolytes. Will continue current regimen.  Follow-up closely in another 2 weeks to ensure her volume status is remaining stable.

## 2019-10-24 NOTE — Assessment & Plan Note (Signed)
History of PAF maintaining sinus rhythm on amiodarone and Xarelto oral anticoagulation.

## 2019-10-24 NOTE — Assessment & Plan Note (Signed)
>>  ASSESSMENT AND PLAN FOR DIASTOLIC HEART FAILURE (HCC) WRITTEN ON 10/30/2019  4:05 PM BY BLOOMFIELD, CARLEY D, DO  Ms. Mourer presents for close follow-up due to recent hospitalization for acute on chronic HFpEF. Her weight today is up 6 kg from 2 weeks ago. However, she denies any symptoms of volume overload. Her exam today is consistent with this. She confesses that she is not compliant with Torsemide every day. Due to how often she urinates, she will often not take it if she is going out for the day. Even when she is at home, she has a difficult time making it to the bathroom that often due to her limited mobility and will frequently soil her clothes and sheets. She is hopeful to get a purewick at the beginning of next month to help with this issue. Patient would also benefit from a motorized scooter to assist with mobility to better perform ADLS, including toileting, feeding and dressing. She is not able to use a cane or walker due to chronic pain related to OA of both knees.  Although she does not appear overtly volume overloaded today, her weight trend is certainly concerning. I advised her to keep a close eye on her weights at home and to do whatever she can to take the torsemide every day without missing a dose.  BMP today shows stable function and electrolytes. Will continue current regimen.  Follow-up closely in another 2 weeks to ensure her volume status is remaining stable.

## 2019-10-24 NOTE — Assessment & Plan Note (Signed)
History of remote pulmonary embolism on oral anticoagulation.

## 2019-10-24 NOTE — Telephone Encounter (Signed)
Spoke with pt and she stated her cardiologist took her off Amiodarone and did not replace medication. Nothing further needed at this time.   Just sending as FYI to Dr. Elsworth Soho

## 2019-10-24 NOTE — Assessment & Plan Note (Signed)
>>  ASSESSMENT AND PLAN FOR DIASTOLIC HEART FAILURE (HCC) WRITTEN ON 10/24/2019  2:56 PM BY BERRY, Delton See, MD  History of diastolic heart failure on torsemide with chronic lower extremity edema.  She does not particularly watch her salt intake.

## 2019-10-24 NOTE — Patient Instructions (Signed)
Medication Instructions:   STOP AMIODARONE  *If you need a refill on your cardiac medications before your next appointment, please call your pharmacy*   Lab Work: If you have labs (blood work) drawn today and your tests are completely normal, you will receive your results only by: [name] MyChart Message (if you have MyChart) OR . A paper copy in the mail If you have any lab test that is abnormal or we need to change your treatment, we will call you to review the results.   Follow-Up: At [facility], you and your health needs are our priority.  As part of our continuing mission to provide you with exceptional heart care, we have created designated Provider Care Teams.  These Care Teams include your primary Cardiologist (physician) and Advanced Practice Providers (APPs -  Physician Assistants and Nurse Practitioners) who all work together to provide you with the care you need, when you need it.  We recommend signing up for the patient portal called "MyChart".  Sign up information is provided on this After Visit Summary.  MyChart is used to connect with patients for Virtual Visits (Telemedicine).  Patients are able to view lab/test results, encounter notes, upcoming appointments, etc.  Non-urgent messages can be sent to your provider as well.   To learn more about what you can do with MyChart, go to [email or web address].    Your next appointment:    Your physician wants you to follow-up in: [place] will receive a reminder letter in the mail two months in advance. If you don't receive a letter, please call our office to schedule the follow-up appointment.   Your physician wants you to follow-up in: [place] will receive a reminder letter in the mail two months in advance. If you don't receive a letter, please call our office to schedule the follow-up appointment.

## 2019-10-24 NOTE — Assessment & Plan Note (Signed)
Ongoing tobacco abuse recalcitrant to respect modification.  She is wearing a 21 mg NicoDerm patch however.

## 2019-10-24 NOTE — Assessment & Plan Note (Signed)
History of coronary calcification with a coronary CTA that showed stenosis in a nondominant RCA.

## 2019-10-24 NOTE — Assessment & Plan Note (Signed)
History of diastolic heart failure on torsemide with chronic lower extremity edema.  She does not particularly watch her salt intake.

## 2019-10-24 NOTE — Assessment & Plan Note (Signed)
History of obstructive sleep apnea on CPAP. 

## 2019-10-24 NOTE — Assessment & Plan Note (Signed)
History of essential hypertension blood pressure measured today 120/82.  She is on Lotensin, hydralazine and metoprolol.

## 2019-10-24 NOTE — Assessment & Plan Note (Signed)
History of interstitial lung disease followed by Dr. Elsworth Soho.  I am going to discontinue her amiodarone.

## 2019-10-24 NOTE — Assessment & Plan Note (Signed)
BMI of 49.  She has gained over 15 pounds in the last several weeks.  She says this is because of eating.

## 2019-10-24 NOTE — Assessment & Plan Note (Addendum)
History of COPD followed by Dr. Elsworth Soho with ongoing tobacco abuse.

## 2019-10-24 NOTE — Progress Notes (Signed)
10/24/2019 Sharon Vasquez   1950/10/25  [id number]  Primary Physician Andrew Au, MD Primary Cardiologist: Lorretta Harp MD Sharon Vasquez, [place]  HPI:  Sharon Vasquez is a 69 y.o.  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 I last spoke to her for a virtual telemedicine phone visit 08/23/2018. She has a history of treated hypertension hyperlipidemia. She is had a pulmonary embolus in the past on Xarelto oral anticoagulation. Sister recently had a myocardial infarction. She has greater than 100 pack years of tobacco abuse as well and continues to smoke. She is never had a heart attack or stroke. She did have a high resolution chest CT performed 12/31/2017 that showed left main/three-vessel coronary calcification. She does complain of some atypical chest pain. She recently had a sleep study and was begun on CPAP. She is aware of salt restriction.   She had a coronary CTA/FFR which showed a physiologically significant lesion in a small nondominant RCA. She is also gained 20 pound since I saw her last. She complains of significant shortness of breath, dyspnea on exertion and orthopnea. Her last 2D echocardiogram performed 07/12/2016 showed normal LV systolic function.  Since I spoke to her a year ago she continues to do well.  She is plagued by constant weight gain which she attributes to overeating.  She is on CPAP but not on nocturnal O2.  She is chronically short of breath but denies chest pain.  She is mostly homebound at the current time.  She is on oral anticoagulation.  She does continue to smoke.   Current Meds  Medication Sig  . albuterol (PROAIR HFA) 108 (90 Base) MCG/ACT inhaler Inhale 2 puffs into the lungs every 6 (six) hours as needed for wheezing or shortness of  breath.  [name] albuterol (PROVENTIL) (2.5 MG/3ML) 0.083% nebulizer solution TAKE 3 MLS(1 AMPULE) BY NEBULIZATION EVERY 4 HOURS AS NEEDED FOR WHHEZING OR SHORTNESS OF BREATH (Patient taking differently: Take 2.5 mg by nebulization every 4 (four) hours as needed for wheezing or shortness of breath. )  . aspirin EC 81 MG tablet Take 81 mg by mouth daily.  [name] atorvastatin (LIPITOR) 40 MG tablet Take 1 tablet (40 mg total) by mouth daily.  . benazepril (LOTENSIN) 40 MG tablet Take 1 tablet (40 mg total) by mouth daily.  [name] esomeprazole (NEXIUM) 40 MG capsule Take 1 capsule (40 mg total) by mouth daily as needed (for heartburn or indigestion).  . Fluticasone-Salmeterol (ADVAIR DISKUS) 250-50 MCG/DOSE AEPB Inhale 1 puff into the lungs 2 (two) times daily. Rinse mouth after each use  . gabapentin (NEURONTIN) 400 MG capsule Take 1 capsule (400 mg total) by mouth 2 (two) times daily.  . hydrALAZINE (APRESOLINE) 25 MG tablet Take 1 tablet (25 mg total) by mouth 3 (three) times daily.  . metoprolol succinate (TOPROL-XL) 100 MG 24 hr tablet TAKE 1 TABLET(100 MG) BY MOUTH DAILY WITH OR IMMEDIATELY FOLLOWING A MEAL  . oxyCODONE-acetaminophen (PERCOCET/ROXICET) 5-325 MG tablet Take 1 tablet by mouth daily as needed for severe pain.  . potassium chloride (KLOR-CON) 10 MEQ tablet TAKE 2 TABLETS(20 MEQ) BY MOUTH TWICE DAILY (Patient taking differently: Take 20 mEq by mouth 2 (two) times daily. )  . rivaroxaban (XARELTO) 20 MG TABS tablet Take 1 tablet (20 mg total) by mouth every morning.  [name]  SPIRIVA HANDIHALER 18 MCG inhalation capsule PLACE 1 CAPSULE INTO INHALER AND INHALE DAILY (Patient taking differently: Place 18 mcg into inhaler and inhale daily. )  . torsemide (DEMADEX) 20 MG tablet Take 2 tablets (40 mg total) by mouth daily.  . [DISCONTINUED] amiodarone (PACERONE) 200 MG tablet Take 0.5 tablets (100 mg total) by mouth daily.     No Known Allergies  Social History   Socioeconomic History  . Marital status:  Divorced    Spouse name: Not on file  . Number of children: Not on file  . Years of education: Not on file  . Highest education level: Not on file  Occupational History  . Not on file  Tobacco Use  . Smoking status: Current Some Day Smoker    Packs/day: 0.10    Years: 55.00    Pack years: 5.50    Types: Cigarettes    Start date: 08/16/1961  . Smokeless tobacco: Never Used  . Tobacco comment: 2 cigs per day  Vaping Use  . Vaping Use: Never used  Substance and Sexual Activity  . Alcohol use: No  . Drug use: No  . Sexual activity: Not Currently    Partners: Male  Other Topics Concern  . Not on file  Social History Narrative   Lives in [facility] in [place]. Lives alone, but is dependent in ADLs/IADLs. 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 Two daughters live nearby. Previously resided in [place].   Social Determinants of Health   Financial Resource Strain: Medium Risk  . Difficulty of Paying Living Expenses: Somewhat hard  Food Insecurity: No Food Insecurity  . Worried About Charity fundraiser in the Last Year: Never true  . Ran Out of Food in the Last Year: Never true  Transportation Needs: No Transportation Needs  . Lack of Transportation (Medical): No  . Lack of Transportation (Non-Medical): No  Physical Activity:   . Days of Exercise per Week:   . Minutes of Exercise per Session:   Stress:   . Feeling of Stress :   Social Connections:   . Frequency of Communication with Friends and Family:   . Frequency of Social Gatherings with Friends and Family:   . Attends Religious Services:   . Active Member of Clubs or Organizations:   . Attends Archivist Meetings:   [name] Marital Status:   Intimate Partner Violence:   . Fear of Current or Ex-Partner:   . Emotionally Abused:   [name] Physically Abused:   . Sexually Abused:      Review of Systems: General: negative for chills, fever, night sweats or weight changes.    Cardiovascular: negative for chest pain, dyspnea on exertion, edema, orthopnea, palpitations, paroxysmal nocturnal dyspnea or shortness of breath Dermatological: negative for rash Respiratory: negative for cough or wheezing Urologic: negative for hematuria Abdominal: negative for nausea, vomiting, diarrhea, bright red blood per rectum, melena, or hematemesis Neurologic: negative for visual changes, syncope, or dizziness All other systems reviewed and are otherwise negative except as noted above.    Blood pressure 120/82, pulse 80, height 6\' 2"  (1.88 m), weight (!) 378 lb 12.8 oz (171.8 kg), SpO2 98 %.  General appearance: alert and no distress Neck: no adenopathy, no carotid bruit, no JVD, supple, symmetrical, trachea midline and thyroid not enlarged, symmetric, no tenderness/mass/nodules Lungs: Occasional bilateral wheezes Heart: regular rate and rhythm, S1, S2 normal, no murmur, click, rub or gallop Extremities: 2-3+  edema with venous stasis changes. Pulses: 2+ and symmetric Skin: Venous stasis changes. Neurologic: Alert and oriented X 3, normal strength and tone. Normal symmetric reflexes. Normal coordination and gait  EKG not performed today.  ASSESSMENT AND PLAN:   Morbid obesity ([place]) BMI of 49.  She has gained over 15 pounds in the last several weeks.  She says this is because of eating.  Benign essential HTN History of essential hypertension blood pressure measured today 120/82.  She is on Lotensin, hydralazine and metoprolol.  COPD (chronic obstructive pulmonary disease) (HCC) History of COPD followed by Dr. Elsworth Soho with ongoing tobacco abuse.  (HFpEF) heart failure with preserved ejection fraction (HCC) History of diastolic heart failure on torsemide with chronic lower extremity edema.  She does not particularly watch her salt intake.  PAF (paroxysmal atrial fibrillation) (HCC) History of PAF maintaining sinus rhythm on amiodarone and Xarelto oral  anticoagulation.  Tobacco use Ongoing tobacco abuse recalcitrant to respect modification.  She is wearing a 21 mg NicoDerm patch however.  History of pulmonary embolism History of remote pulmonary embolism on oral anticoagulation.  OSA (obstructive sleep apnea) History of obstructive sleep apnea on CPAP  Coronary artery calcification seen on CT scan History of coronary calcification with a coronary CTA that showed stenosis in a nondominant RCA.  ILD (interstitial lung disease) ([place]) History of interstitial lung disease followed by Dr. Elsworth Soho.  I am going to discontinue her amiodarone.      Lorretta Harp MD FACP,FACC,FAHA, [facility] 10/24/2019 2:58 PM

## 2019-10-27 ENCOUNTER — Ambulatory Visit: Payer: Medicare Other | Admitting: Podiatrist

## 2019-10-27 NOTE — Progress Notes (Signed)
Internal Medicine Clinic Attending  Case discussed with Dr. Bloomfield  At the time of the visit.  We reviewed the resident's history and exam and pertinent patient test results.  I agree with the assessment, diagnosis, and plan of care documented in the resident's note.  

## 2019-10-30 ENCOUNTER — Telehealth: Payer: Self-pay | Admitting: *Deleted

## 2019-10-30 DIAGNOSIS — I5032 Chronic diastolic (congestive) heart failure: Secondary | ICD-10-CM

## 2019-10-30 DIAGNOSIS — M170 Bilateral primary osteoarthritis of knee: Secondary | ICD-10-CM

## 2019-10-30 NOTE — Addendum Note (Signed)
Addended by: Modena Nunnery D on: 10/30/2019 04:07 PM   Modules accepted: Orders

## 2019-10-30 NOTE — Telephone Encounter (Signed)
Received call from L'Darryl at [place] requesting a Rx for scooter be faxed to [facility] and [facility] at [phone]. [employer] is located at [address]. [place], [place] [address]. L'Darryl states this may be covered by insurance as patient has [facility] and [facility]. Hubbard Hartshorn, BSN, RN-BC

## 2019-10-31 ENCOUNTER — Ambulatory Visit: Payer: Medicare Other | Admitting: *Deleted

## 2019-10-31 DIAGNOSIS — J449 Chronic obstructive pulmonary disease, unspecified: Secondary | ICD-10-CM

## 2019-10-31 DIAGNOSIS — I5032 Chronic diastolic (congestive) heart failure: Secondary | ICD-10-CM

## 2019-10-31 DIAGNOSIS — I10 Essential (primary) hypertension: Secondary | ICD-10-CM

## 2019-10-31 NOTE — Patient Instructions (Signed)
Visit Information I will keep you updated on your DME requests of an electric scooter and the PureWick system. Goals Addressed              This Visit's Progress     Patient Stated   .  "Can you call my [facility] navigator?  I don't think he's doing his job." (pt-stated)        CARE PLAN ENTRY (see longitudinal plan of care for additional care plan information)  Current Barriers:  . Care Coordination needs related to securing electric scooter and in network oral surgeons in a patient with COPD, PAF, HF, HTN and morbid obesity.- patient called this CCM RN requesting assistance with determining status of electric scooter , securing in network oral surgeon as she needs  6 teeth extracted,  and she is requesting help with a benefit exception to obtain a PureWick external female catheter. She says she is attending pelvic rehab but she still avoids taking her diuretics in the morning on the days she has errands outside the home for fear of urinary incontinence  Nurse Case Manager Clinical Goal(s):  [name] Over the next 30- 90 days, patient will work with [facility] Navigator  to secure electric scooter and in network oral surgeons.  Interventions:  . Inter-disciplinary care team collaboration (see longitudinal plan of care) . Collaborated with [facility] L'Darryl at [phone] regarding patient's request for directory of in -network oral surgeons and  proceeding with securing electric scooter for patient and with assisting with the benefit exception related to the [place]. Spoke with L'Darryl by phone; he will mail oral surgeon directory to patient's home address and e-mail the directory to this CCM RN . Advised patient that L'Darryl called the clinic yesterday with request for order for electric scooter , that order was written by provider and order will be faxed to [facility] and [employer] per L'Darryl's request . Emailed L'Darryl requesting assistance with benefit exception  for PureWick female urinary collection system  Patient Self Care Activities:  . Patient verbalizes understanding of plan to work with [facility] navigator and CCM RN to address her concerns related to directory of in network oral surgeons, securing an Transport planner and assisting with securing the PureWick for home use . Unable to independently secure in network providers and DME  Initial goal documentation        The patient verbalized understanding of instructions provided today and declined a print copy of patient instruction materials.   The care management team will reach out to the patient again over the next 30-60 days.   Kelli Churn RN, CCM, [facility] RN Care Manager [phone]

## 2019-10-31 NOTE — Chronic Care Management (AMB) (Signed)
Chronic Care Management   Follow Up Note   10/31/2019 Name: Sharon Vasquez [name]: [id number] DOB: 05-09-1950  Referred by: Andrew Au, MD Reason for referral : Chronic Care Management (COPD, HTN, PAF, PE)   Sharon Vasquez is a 69 y.o. year old female who is a primary care patient of [name], [name], MD. The CCM team was consulted for assistance with chronic disease management and care coordination needs.    Review of patient status, including review of consultants reports, relevant laboratory and other test results, and collaboration with appropriate care team members and the patient's provider was performed as part of comprehensive patient evaluation and provision of chronic care management services.    SDOH (Social Determinants of Health) assessments performed: No See Care Plan activities for detailed interventions related to [facility])     Outpatient Encounter Medications as of 10/31/2019  Medication Sig Note  . albuterol (PROAIR HFA) 108 (90 Base) MCG/ACT inhaler Inhale 2 puffs into the lungs every 6 (six) hours as needed for wheezing or shortness of breath.   [name] albuterol (PROVENTIL) (2.5 MG/3ML) 0.083% nebulizer solution TAKE 3 MLS(1 AMPULE) BY NEBULIZATION EVERY 4 HOURS AS NEEDED FOR WHHEZING OR SHORTNESS OF BREATH (Patient taking differently: Take 2.5 mg by nebulization every 4 (four) hours as needed for wheezing or shortness of breath. ) 06/24/2019: Use once every two weeks  . aspirin EC 81 MG tablet Take 81 mg by mouth daily.   [name] atorvastatin (LIPITOR) 40 MG tablet Take 1 tablet (40 mg total) by mouth daily.   . benazepril (LOTENSIN) 40 MG tablet Take 1 tablet (40 mg total) by mouth daily.   [name] esomeprazole (NEXIUM) 40 MG capsule Take 1 capsule (40 mg total) by mouth daily as needed (for heartburn or indigestion).   . Fluticasone-Salmeterol (ADVAIR DISKUS) 250-50 MCG/DOSE AEPB Inhale 1 puff into the lungs 2 (two) times daily. Rinse mouth after each use   . gabapentin (NEURONTIN) 400 MG  capsule Take 1 capsule (400 mg total) by mouth 2 (two) times daily.   . hydrALAZINE (APRESOLINE) 25 MG tablet Take 1 tablet (25 mg total) by mouth 3 (three) times daily.   . metoprolol succinate (TOPROL-XL) 100 MG 24 hr tablet TAKE 1 TABLET(100 MG) BY MOUTH DAILY WITH OR IMMEDIATELY FOLLOWING A MEAL   . oxyCODONE-acetaminophen (PERCOCET/ROXICET) 5-325 MG tablet Take 1 tablet by mouth daily as needed for severe pain. 06/24/2019: Takes every 6 hours round the clock  . potassium chloride (KLOR-CON) 10 MEQ tablet TAKE 2 TABLETS(20 MEQ) BY MOUTH TWICE DAILY (Patient taking differently: Take 20 mEq by mouth 2 (two) times daily. )   . rivaroxaban (XARELTO) 20 MG TABS tablet Take 1 tablet (20 mg total) by mouth every morning.   [name] SPIRIVA HANDIHALER 18 MCG inhalation capsule PLACE 1 CAPSULE INTO INHALER AND INHALE DAILY (Patient taking differently: Place 18 mcg into inhaler and inhale daily. )   . torsemide (DEMADEX) 20 MG tablet Take 2 tablets (40 mg total) by mouth daily.    No facility-administered encounter medications on file as of 10/31/2019.     Objective:  BP Readings from Last 3 Encounters:  10/24/19 120/82  10/23/19 122/66  10/09/19 (!) 116/49   Wt Readings from Last 3 Encounters:  10/24/19 (!) 378 lb 12.8 oz (171.8 kg)  10/23/19 (!) 376 lb 9.6 oz (170.8 kg)  10/09/19 (!) 362 lb 6.4 oz (164.4 kg)    Goals Addressed  This Visit's Progress     Patient Stated   .  "Can you call my [facility] navigator?  I don't think he's doing his job." (pt-stated)        CARE PLAN ENTRY (see longitudinal plan of care for additional care plan information)  Current Barriers:  . Care Coordination needs related to securing electric scooter and in network oral surgeons in a patient with COPD, PAF, HF, HTN and morbid obesity.- patient called this CCM RN requesting assistance with determining status of electric scooter , securing in network oral surgeon as she needs  6 teeth extracted,  and she  is requesting help with a benefit exception to obtain a PureWick external female catheter, She says she is attending pelvic rehab but she still avoids taking her diuretics in the morning on the days she has errands outside the home for fear of urinary incontinence  Nurse Case Manager Clinical Goal(s):  [name] Over the next 30- 90 days, patient will work with [facility] Navigator  to secure electric scooter and in network oral surgeons.  Interventions:  . Inter-disciplinary care team collaboration (see longitudinal plan of care) . Collaborated with [facility] L'Darryl at [phone] regarding patient's request for directory of in -network oral surgeons and  proceeding with securing electric scooter for patient and with assisting with the benefit exception related to the [place]. Spoke with L'Darryl by phone; he will mail oral surgeon directory to patient's home address and e-mail the directory to this CCM RN . Advised patient that L'Darryl called the clinic yesterday with request for order for electric scooter , that order was written by provider and order will be faxed to [facility] and [employer] per L'Darryl's request . Emailed L'Darryl, [facility]  requesting assistance with benefit exception for PureWick female urinary collection system.e-mail reply received from L'Darryl he states provider will need to write a prescription and a letter of medical necessity since the system is not an approved DME item  . Messaged provider requesting prescription and letter of medical necessity for PureWick    Patient Self Care Activities:  . Patient verbalizes understanding of plan to work with [facility] navigator and CCM RN to address her concerns related to directory of in network oral surgeons, securing an Transport planner and assisting with securing the PureWick for home use . Unable to independently secure in network providers and DME  Initial goal documentation         Plan:    The care management team will reach out to the patient again over the next 30-60 days.    Kelli Churn RN, CCM, [facility] RN Care Manager [phone]

## 2019-11-01 NOTE — Progress Notes (Signed)
Internal Medicine Clinic Resident  I have personally reviewed this encounter including the documentation in this note and/or discussed this patient with the care management provider. I will address any urgent items identified by the care management provider and will communicate my actions to the patient's PCP. I have reviewed the patient's CCM visit with my supervising attending, Dr Faythe Ghee.  Order for [place] placed and letter stating need for Purewick in patient chart.   Harvie Heck, MD  [facility] PGY-2 11/01/2019

## 2019-11-01 NOTE — Addendum Note (Signed)
Addended by: Harvie Heck on: 11/01/2019 03:23 PM   Modules accepted: Orders

## 2019-11-03 DIAGNOSIS — M170 Bilateral primary osteoarthritis of knee: Secondary | ICD-10-CM | POA: Diagnosis not present

## 2019-11-03 DIAGNOSIS — Z79899 Other long term (current) drug therapy: Secondary | ICD-10-CM | POA: Diagnosis not present

## 2019-11-03 DIAGNOSIS — M7989 Other specified soft tissue disorders: Secondary | ICD-10-CM | POA: Diagnosis not present

## 2019-11-05 ENCOUNTER — Telehealth: Payer: Medicare Other

## 2019-11-05 ENCOUNTER — Ambulatory Visit: Payer: Medicare Other | Admitting: *Deleted

## 2019-11-05 DIAGNOSIS — J449 Chronic obstructive pulmonary disease, unspecified: Secondary | ICD-10-CM

## 2019-11-05 DIAGNOSIS — I5032 Chronic diastolic (congestive) heart failure: Secondary | ICD-10-CM

## 2019-11-05 DIAGNOSIS — I10 Essential (primary) hypertension: Secondary | ICD-10-CM

## 2019-11-05 NOTE — Chronic Care Management (AMB) (Signed)
Chronic Care Management   Follow Up Note   11/05/2019 Name: Sharon Vasquez [name]: [id number] DOB: 12-25-1950  Referred by: Andrew Au, MD Reason for referral : Chronic Care Management (HF, HTN, COPD)   Sharon Vasquez is a 69 y.o. year old female who is a primary care patient of [name], [name], MD. The CCM team was consulted for assistance with chronic disease management and care coordination needs.    Review of patient status, including review of consultants reports, relevant laboratory and other test results, and collaboration with appropriate care team members and the patient's provider was performed as part of comprehensive patient evaluation and provision of chronic care management services.    SDOH (Social Determinants of Health) assessments performed: No See Care Plan activities for detailed interventions related to [facility])     Outpatient Encounter Medications as of 11/05/2019  Medication Sig Note  . albuterol (PROAIR HFA) 108 (90 Base) MCG/ACT inhaler Inhale 2 puffs into the lungs every 6 (six) hours as needed for wheezing or shortness of breath.   [name] albuterol (PROVENTIL) (2.5 MG/3ML) 0.083% nebulizer solution TAKE 3 MLS(1 AMPULE) BY NEBULIZATION EVERY 4 HOURS AS NEEDED FOR WHHEZING OR SHORTNESS OF BREATH (Patient taking differently: Take 2.5 mg by nebulization every 4 (four) hours as needed for wheezing or shortness of breath. ) 06/24/2019: Use once every two weeks  . aspirin EC 81 MG tablet Take 81 mg by mouth daily.   [name] atorvastatin (LIPITOR) 40 MG tablet Take 1 tablet (40 mg total) by mouth daily.   . benazepril (LOTENSIN) 40 MG tablet Take 1 tablet (40 mg total) by mouth daily.   [name] esomeprazole (NEXIUM) 40 MG capsule Take 1 capsule (40 mg total) by mouth daily as needed (for heartburn or indigestion).   . Fluticasone-Salmeterol (ADVAIR DISKUS) 250-50 MCG/DOSE AEPB Inhale 1 puff into the lungs 2 (two) times daily. Rinse mouth after each use   . gabapentin (NEURONTIN) 400 MG capsule  Take 1 capsule (400 mg total) by mouth 2 (two) times daily.   . hydrALAZINE (APRESOLINE) 25 MG tablet Take 1 tablet (25 mg total) by mouth 3 (three) times daily.   . metoprolol succinate (TOPROL-XL) 100 MG 24 hr tablet TAKE 1 TABLET(100 MG) BY MOUTH DAILY WITH OR IMMEDIATELY FOLLOWING A MEAL   . oxyCODONE-acetaminophen (PERCOCET/ROXICET) 5-325 MG tablet Take 1 tablet by mouth daily as needed for severe pain. 06/24/2019: Takes every 6 hours round the clock  . potassium chloride (KLOR-CON) 10 MEQ tablet TAKE 2 TABLETS(20 MEQ) BY MOUTH TWICE DAILY (Patient taking differently: Take 20 mEq by mouth 2 (two) times daily. )   . rivaroxaban (XARELTO) 20 MG TABS tablet Take 1 tablet (20 mg total) by mouth every morning.   [name] SPIRIVA HANDIHALER 18 MCG inhalation capsule PLACE 1 CAPSULE INTO INHALER AND INHALE DAILY (Patient taking differently: Place 18 mcg into inhaler and inhale daily. )   . torsemide (DEMADEX) 20 MG tablet Take 2 tablets (40 mg total) by mouth daily.    No facility-administered encounter medications on file as of 11/05/2019.     Objective:   Goals Addressed              This Visit's Progress     Patient Stated   .  "Can you call my [facility] navigator?  I don't think he's doing his job." (pt-stated)        CARE PLAN ENTRY (see longitudinal plan of care for additional care plan information)  Current  Barriers:  . Care Coordination needs related to securing electric scooter and in network oral surgeons in a patient with COPD, PAF, HF, HTN and morbid obesity.- patient called by this CCM RN to update her on progress of electric scooter and PureWick incontinence sytem  Nurse Case Manager Clinical Goal(s):  [name] Over the next 30- 90 days, patient will work with [facility] Navigator  to secure electric scooter and in network oral surgeons.  Interventions:  . Inter-disciplinary care team collaboration (see longitudinal plan of care) . Updated patient via phone re:e-mail to L'Darryl  requesting assistance with benefit exception for PureWick female urinary collection system and advising him that patient's provider wrote prescription and documented letter of medical necessity for PureWick in electronic medical record  Patient Self Care Activities:  . Patient verbalizes understanding of plan to work with [facility] navigator and CCM RN to address her concerns related to directory of in network oral surgeons, securing an Transport planner and assisting with securing the PureWick for home use . Unable to independently secure in network providers and DME  Please see past updates related to this goal by clicking on the "Past Updates" button in the selected goal          Plan:   The care management team will reach out to the patient again over the next 30-60 days.    Kelli Churn RN, CCM, [facility] RN Care Manager [phone]

## 2019-11-06 NOTE — Progress Notes (Signed)
Internal Medicine Clinic Resident  I have personally reviewed this encounter including the documentation in this note and/or discussed this patient with the care management provider. I will address any urgent items identified by the care management provider and will communicate my actions to the patient's PCP. I have reviewed the patient's CCM visit with my supervising attending, Dr Mullen.  Berneta Sconyers, MD 11/06/2019    

## 2019-11-07 NOTE — Telephone Encounter (Signed)
Order for scooter along with OV notes from 10/23/2019 faxed to [facility] and [employer] at [phone]. Fax confirmation receipt received. Hubbard Hartshorn, BSN, RN-BC

## 2019-11-10 ENCOUNTER — Ambulatory Visit: Payer: Medicare Other | Admitting: *Deleted

## 2019-11-10 ENCOUNTER — Encounter: Payer: Self-pay | Admitting: Student

## 2019-11-10 ENCOUNTER — Ambulatory Visit (INDEPENDENT_AMBULATORY_CARE_PROVIDER_SITE_OTHER): Payer: Medicare Other | Admitting: Student

## 2019-11-10 ENCOUNTER — Other Ambulatory Visit: Payer: Self-pay

## 2019-11-10 DIAGNOSIS — J449 Chronic obstructive pulmonary disease, unspecified: Secondary | ICD-10-CM

## 2019-11-10 DIAGNOSIS — K224 Dyskinesia of esophagus: Secondary | ICD-10-CM

## 2019-11-10 DIAGNOSIS — I480 Paroxysmal atrial fibrillation: Secondary | ICD-10-CM | POA: Diagnosis not present

## 2019-11-10 DIAGNOSIS — R1319 Other dysphagia: Secondary | ICD-10-CM

## 2019-11-10 DIAGNOSIS — M170 Bilateral primary osteoarthritis of knee: Secondary | ICD-10-CM

## 2019-11-10 DIAGNOSIS — I5032 Chronic diastolic (congestive) heart failure: Secondary | ICD-10-CM

## 2019-11-10 DIAGNOSIS — R1310 Dysphagia, unspecified: Secondary | ICD-10-CM | POA: Diagnosis not present

## 2019-11-10 DIAGNOSIS — I10 Essential (primary) hypertension: Secondary | ICD-10-CM

## 2019-11-10 HISTORY — DX: Dyskinesia of esophagus: K22.4

## 2019-11-10 NOTE — Assessment & Plan Note (Signed)
Recent hospitalization 7/1 for HF exacerbation. Echo on 09/30/19 showed EF of 65-75% with Grade 2 diastolic dysfunction. She is having difficulty complying with her torsemide because of the frequent urination. Avoids taking it when she goes out, and at home has issues with soiling herself. States she has been taking it "a little better" recently. We are trying to get a Purewick and electric scooter to help with compliance.  She states that her SOB and leg swelling are at baseline. No crackles on exam. Legs are edematous with chronic venous stasis changes. Weight today is the same as prior 2 visits, but up 14lb from discharge. Last 2 weeks ago were benign.  -continue trying to obtain [facility] and electric scooter -continue Toprol 100 daily -continue torsemide 40mg  BID -patient declined PT referral -f/u in 4 weeks to assess HF symptoms

## 2019-11-10 NOTE — Assessment & Plan Note (Signed)
Patient reports chronic bilateral knee pain from OA. Following with orthopedics for this, gets steroid injections. Also tried nerve ablation in April with IR but this was not effective for her. Continue following with ortho. Also follows at a pain clinic.

## 2019-11-10 NOTE — Progress Notes (Addendum)
Internal Medicine Clinic Resident  I have personally reviewed this encounter including the documentation in this note and/or discussed this patient with the care management provider. I will address any urgent items identified by the care management provider and will communicate my actions to the patient's PCP. I have reviewed the patient's CCM visit with my supervising attending, Dr Heber Habersham.  Sanjuana Letters, MD 11/10/2019

## 2019-11-10 NOTE — Progress Notes (Signed)
   CC: f/u HFpEF  HPI:  Sharon Vasquez is a 69 y.o. female with history as below presenting for follow up on chronic HFpEF. Please refer to problem based charting for further details of assessment and plan of current problem and chronic medical conditions.  Past Medical History:  Diagnosis Date  . A-fib ([place])   . Anxiety   . Arthritis    "qwhre; joints, back" (04/17/2017)  . Benign breast cyst in female, left 01/08/2017   Found by Screening mammogram, evaluated by U/S on 01/08/17 and determined to be a benign simple breast cyst.  . Cellulitis of left lower leg 05/30/2017  . CHF (congestive heart failure) ([place])   . Chronic low back pain 08/21/2016  . Chronic lower back pain   . Chronic venous insufficiency    [name] 05/30/2017  . COPD (chronic obstructive pulmonary disease) ([place])   . Depression   . DVT (deep venous thrombosis) ([place]) 11/16/2016  . GERD (gastroesophageal reflux disease)   . Headache    "weekly for the last 3 months" (04/17/2017)  . Hyperlipidemia   . Hypertension   . Morbid obesity ([place])   . PE (pulmonary embolism)   . Pulmonary embolism ([place]) 09/21/2014   Review of Systems:   Review of Systems  Respiratory: Positive for shortness of breath (baseline).   Cardiovascular: Positive for palpitations and leg swelling (baseline). Negative for chest pain.  Musculoskeletal: Positive for joint pain (chronic knee pain). Negative for falls.  Endo/Heme/Allergies: Bruises/bleeds easily.     Physical Exam: Vitals:   11/10/19 0836  BP: 114/67  Pulse: 68  Temp: 98.1 F (36.7 C)  TempSrc: Oral  SpO2: 97%  Weight: (!) 376 lb 9.6 oz (170.8 kg)  Height: 6\' 2"  (1.88 m)   Constitutional: no acute distress Head: atraumatic ENT: external ears normal Cardiovascular: regular rate and rhythm, normal heart sounds, significant lower extremity edema with severe chronic venous stasis changes, unable to properly assess degree of pitting because of how thickened the skin has  become Pulmonary: effort normal, normal breath sounds bilaterally, no crackles Abdominal: flat, nontender, no rebound tenderness, bowel sounds normal Skin: warm and dry Neurological: alert, no focal deficit Psychiatric: normal mood and affect  Assessment & Plan:   See Encounters Tab for problem based charting.  Patient seen with Dr. Heber Urbank

## 2019-11-10 NOTE — Patient Instructions (Signed)
Visit Information It was nice meeting you today. I will keep you updated on the progress in securing the [facility] and electic scooter for you.  Goals Addressed              This Visit's Progress     Patient Stated   .  "Can you call my [facility] navigator?  I don't think he's doing his job." (pt-stated)        CARE PLAN ENTRY (see longitudinal plan of care for additional care plan information)  Current Barriers:  . Care Coordination needs related to securing electric scooter and in network oral surgeons in a patient with COPD, PAF, HF, HTN and morbid obesity.- met with patient in the clinic after her appointment, she is requesting this CCM RN assist with expediting the benefit exception for the PureWick, she says her [facility] Medicare navigator [name] cannot find a supplier for the [employer]  Nurse Case Manager Clinical Goal(s):  [name] Over the next 30- 90 days, patient will work with [facility] Navigator  to secure electric scooter and in network oral surgeons.  Interventions:  . Inter-disciplinary care team collaboration (see longitudinal plan of care) . After meeting with patient in the clinic, secure e-mail sent to L'Darryl at [facility] with names and contact numbers of 2 suppliers of the [place] and [employer] and offered to assist in any way with expediting the benefit exception for the [place].    Patient Self Care Activities:  . Patient verbalizes understanding of plan to work with [facility] navigator and CCM RN to address her concerns related to directory of in network oral surgeons, securing an Transport planner and assisting with securing the PureWick for home use . Unable to independently secure in network providers and DME  Please see past updates related to this goal by clicking on the "Past Updates" button in the selected goal      .  "I try to do what my doctors tell me to do" (pt-stated)        [place] (see longitudinal plan of care for  additional care plan information)  Current Barriers:  . Chronic Disease Management support, education, and care coordination needs related to Atrial Fibrillation, CHF, HTN, and COPD-Spoke with patient after her clinic visit, she say she has been having trouble swallowing, even water, and has a hx of an esophageal ulcer from a lodged popcorn kernel, to complete transition of care assessment, she was hospitalized from 6/29-10/02/19 for elevated blood pressure and heart failure she says she takes her torsemide daily but varies the time she takes it if she is going out, since she has urge incontinence, she says she went to the hospital because of elevated blood pressure that she attributes to something she ate, she says she was told to weight herself daily, says her home monitored blood pressure readings are back to normal and she is taking her 3 blood pressure medicines as prescribed  Clinical Goal(s) related to Atrial Fibrillation, CHF, HTN, and COPD:  Over the next 30 days, patient will:  . Work with the care management team to address educational, disease management, and care coordination needs  . Begin or continue self health monitoring activities as directed today Measure and record blood pressure 5-7 times per week and Measure and record weight daily . Call provider office for new or worsened signs and symptoms Blood pressure findings outside established parameters, Weight outside established parameters, Shortness of breath, and New or  worsened symptom related to CHF and COPD . Call care management team with questions or concerns . Verbalize basic understanding of patient centered plan of care established today  Interventions related to Atrial Fibrillation, CHF, HTN, and COPD:  . Appropriate assessments completed . Transition of care assessment completed . Reviewed heart failure action plan . Reviewed upcoming appointments on 7/7/, 7/8/ and CT of chest on 7/9 and ensured she has  transportation  Patient Self Care Activities related to Atrial Fibrillation, CHF, HTN, and COPD:  . Patient is unable to independently self-manage chronic health conditions  Please see past updates related to this goal by clicking on the "Past Updates" button in the selected goal        Other   .  "I need a scooter to help me get around"        Current Barriers:  . Knowledge Barriers related to resources and support available to address needs related to Inability to perform ADL's independently; need for DME.- met with patient during clinic visit, she is requesting the CCM team help expedite getting the electric scooter to her  Case Manager Clinical Goal(s):  . Over the next 30-90  days, patient will work with BSW to address needs related to Inability to perform ADL's independently, need for DME.   . Over the next 30 -90 days, BSW will collaborate with RN Care Manager to address care management and care coordination needs  Interventions:  . Collaborated with RN, Lauren Ducatte, regarding patients request for scooter.  . Met with patient after her clinic appointment. Patient advised that Lauren faxed the order and clinic notes to [employer] on 7/21; she encouraged patient to contact the company and per patient's request, this CCM RN texted the phone number of [employer] to the patient so she can contact them.  . At patient's request, spoke with [employer] and verified that they did receive fax from last week and that they misinformed patient today that they had not, company is now requesting a demographic sheet and order for PT/OT [employer] Seating Evaluation. . Messaged Dr. [place] of need for order for PT/OT Mobility Seating Evaluation  Patient Self Care Activities:  . Attends all scheduled provider appointments . Calls provider office for new concerns or questions  Please see past updates related to this goal by clicking on the  "Past Updates" button in the selected goal         The patient verbalized understanding of instructions provided today and declined a print copy of patient instruction materials.   The care management team will reach out to the patient again over the next 30-60 days.   Janet Hauser RN, CCM, [facility] CCM Clinic RN Care Manager [phone]  

## 2019-11-10 NOTE — Assessment & Plan Note (Signed)
Amiodarone discontinued by cardiology on 7/23 for her interstitial lung disease. Patient reports one episode of palpitations since then with pulse in the 90s, she took 1 dose of leftover amiodarone and Sx resolved. Reports compliance with Xarelto. On exam has regular rate and rhythm today.  -continue Xarelto 20mg  daily

## 2019-11-10 NOTE — Assessment & Plan Note (Signed)
>>  ASSESSMENT AND PLAN FOR DIASTOLIC HEART FAILURE (HCC) WRITTEN ON 11/10/2019  9:43 AM BY Remo Lipps, MD  Recent hospitalization 7/1 for HF exacerbation. Echo on 09/30/19 showed EF of 65-75% with Grade 2 diastolic dysfunction. She is having difficulty complying with her torsemide because of the frequent urination. Avoids taking it when she goes out, and at home has issues with soiling herself. States she has been taking it "a little better" recently. We are trying to get a Purewick and electric scooter to help with compliance.  She states that her SOB and leg swelling are at baseline. No crackles on exam. Legs are edematous with chronic venous stasis changes. Weight today is the same as prior 2 visits, but up 14lb from discharge. Last 2 weeks ago were benign.  -continue trying to obtain [facility] and electric scooter -continue Toprol 100 daily -continue torsemide 40mg  BID -patient declined PT referral -f/u in 4 weeks to assess HF symptoms

## 2019-11-10 NOTE — Progress Notes (Signed)
Internal Medicine Clinic Attending  I saw and evaluated the patient.  I personally confirmed the key portions of the history and exam documented by Dr. Chen and I reviewed pertinent patient test results.  The assessment, diagnosis, and plan were formulated together and I agree with the documentation in the resident's note.  

## 2019-11-10 NOTE — Chronic Care Management (AMB) (Signed)
Chronic Care Management   Follow Up Note   11/10/2019 Name: Sharon Vasquez [name]: [id number] DOB: 09-07-1950  Referred by: Sharon Au, MD Reason for referral : Chronic Care Management (COPD, HF, HTN, PAF, PE)   Sharon Vasquez is a 69 y.o. year old female who is a primary care patient of Sharon Vasquez, Sharon Reading, MD. The CCM team was consulted for assistance with chronic disease management and care coordination needs.    Review of patient status, including review of consultants reports, relevant laboratory and other test results, and collaboration with appropriate care team members and the patient's provider was performed as part of comprehensive patient evaluation and provision of chronic care management services.    SDOH (Social Determinants of Health) assessments performed: No See Care Plan activities for detailed interventions related to Sharon Vasquez)     Outpatient Encounter Medications as of 11/10/2019  Medication Sig Note  . albuterol (PROAIR HFA) 108 (90 Base) MCG/ACT inhaler Inhale 2 puffs into the lungs every 6 (six) hours as needed for wheezing or shortness of breath.   [name] albuterol (PROVENTIL) (2.5 MG/3ML) 0.083% nebulizer solution TAKE 3 MLS(1 AMPULE) BY NEBULIZATION EVERY 4 HOURS AS NEEDED FOR WHHEZING OR SHORTNESS OF BREATH (Patient taking differently: Take 2.5 mg by nebulization every 4 (four) hours as needed for wheezing or shortness of breath. ) 06/24/2019: Use once every two weeks  . aspirin EC 81 MG tablet Take 81 mg by mouth daily.   [name] atorvastatin (LIPITOR) 40 MG tablet Take 1 tablet (40 mg total) by mouth daily.   . benazepril (LOTENSIN) 40 MG tablet Take 1 tablet (40 mg total) by mouth daily.   [name] esomeprazole (NEXIUM) 40 MG capsule Take 1 capsule (40 mg total) by mouth daily as needed (for heartburn or indigestion).   . Fluticasone-Salmeterol (ADVAIR DISKUS) 250-50 MCG/DOSE AEPB Inhale 1 puff into the lungs 2 (two) times daily. Rinse mouth after each use   . gabapentin (NEURONTIN) 400  MG capsule Take 1 capsule (400 mg total) by mouth 2 (two) times daily.   . hydrALAZINE (APRESOLINE) 25 MG tablet Take 1 tablet (25 mg total) by mouth 3 (three) times daily.   . metoprolol succinate (TOPROL-XL) 100 MG 24 hr tablet TAKE 1 TABLET(100 MG) BY MOUTH DAILY WITH OR IMMEDIATELY FOLLOWING A MEAL   . oxyCODONE-acetaminophen (PERCOCET/ROXICET) 5-325 MG tablet Take 1 tablet by mouth daily as needed for severe pain. 06/24/2019: Takes every 6 hours round the clock  . potassium chloride (KLOR-CON) 10 MEQ tablet TAKE 2 TABLETS(20 MEQ) BY MOUTH TWICE DAILY (Patient taking differently: Take 20 mEq by mouth 2 (two) times daily. )   . rivaroxaban (XARELTO) 20 MG TABS tablet Take 1 tablet (20 mg total) by mouth every morning.   [name] SPIRIVA HANDIHALER 18 MCG inhalation capsule PLACE 1 CAPSULE INTO INHALER AND INHALE DAILY (Patient taking differently: Place 18 mcg into inhaler and inhale daily. )   . torsemide (DEMADEX) 20 MG tablet Take 2 tablets (40 mg total) by mouth daily.    No facility-administered encounter medications on file as of 11/10/2019.     Objective:  Wt Readings from Last 3 Encounters:  11/10/19 (!) 376 lb 9.6 oz (170.8 kg)  10/24/19 (!) 378 lb 12.8 oz (171.8 kg)  10/23/19 (!) 376 lb 9.6 oz (170.8 kg)   BP Readings from Last 3 Encounters:  11/10/19 114/67  10/24/19 120/82  10/23/19 122/66    Goals Addressed  This Visit's Progress     Patient Stated   .  "Can you call my [facility] navigator?  I don't think he's doing his job." (pt-stated)        CARE PLAN ENTRY (see longitudinal plan of care for additional care plan information)  Current Barriers:  . Care Coordination needs related to securing electric scooter and in network oral surgeons in a patient with COPD, PAF, HF, HTN and morbid obesity.- met with patient in the clinic after her appointment, she is requesting this CCM RN assist with expediting the benefit exception for the [employer], she says her [facility] Medicare  navigator Sharon Vasquez cannot find a supplier for the [employer]  Nurse Case Manager Clinical Goal(s):  [name] Over the next 30- 90 days, patient will work with [facility] Navigator  to secure electric scooter and in network oral surgeons.  Interventions:  . Inter-disciplinary care team collaboration (see longitudinal plan of care) . After meeting with patient in the clinic, secure e-mail sent to Sharon at [facility] with names and contact numbers of 2 suppliers of the [place] and [employer] and offered to assist in any way with expediting the benefit exception for the [place].    Patient Self Care Activities:  . Patient verbalizes understanding of plan to work with [facility] navigator and CCM RN to address her concerns related to directory of in network oral surgeons, securing an Transport planner and assisting with securing the PureWick for home use . Unable to independently secure in network providers and DME  Please see past updates related to this goal by clicking on the "Past Updates" button in the selected goal      .  "I try to do what my doctors tell me to do" (pt-stated)        [place] (see longitudinal plan of care for additional care plan information)  Current Barriers:  . Chronic Disease Management support, education, and care coordination needs related to Atrial Fibrillation, CHF, HTN, and COPD-Spoke with patient after her clinic visit, she say she has been having trouble swallowing, even water, and has a hx of an esophageal ulcer from a lodged popcorn kernel, to complete transition of care assessment, she was hospitalized from 6/29-10/02/19 for elevated blood pressure and heart failure she says she takes her torsemide daily but varies the time she takes it if she is going out, since she has urge incontinence, she says she went to the Vasquez because of elevated blood pressure that she attributes to something she ate, she says she was told to weight herself daily,  says her home monitored blood pressure readings are back to normal and she is taking her 3 blood pressure medicines as prescribed  Clinical Goal(s) related to Atrial Fibrillation, CHF, HTN, and COPD:  Over the next 30 days, patient will:  . Work with the care management team to address educational, disease management, and care coordination needs  . Begin or continue self health monitoring activities as directed today Measure and record blood pressure 5-7 times per week and Measure and record weight daily . Call provider office for new or worsened signs and symptoms Blood pressure findings outside established parameters, Weight outside established parameters, Shortness of breath, and New or worsened symptom related to CHF and COPD . Call care management team with questions or concerns . Verbalize basic understanding of patient centered plan of care established today  Interventions related to Atrial Fibrillation, CHF, HTN, and COPD:  .  Appropriate assessments completed . Transition of care assessment completed . Reviewed heart failure action plan . Reviewed upcoming appointments on 7/7/, 7/8/ and CT of chest on 7/9 and ensured she has transportation  Patient Self Care Activities related to Atrial Fibrillation, CHF, HTN, and COPD:  . Patient is unable to independently self-manage chronic health conditions  Please see past updates related to this goal by clicking on the "Past Updates" button in the selected goal        Other   .  "I need a scooter to help me get around"        Current Barriers:  [name] Knowledge Barriers related to resources and support available to address needs related to Inability to perform ADL's independently; need for DME.- met with patient during clinic visit, she is requesting the CCM team help expedite getting the electric scooter to her  Case Manager Clinical Goal(s):  [name] Over the next 30-90  days, patient will work with BSW to address needs related to Inability to  perform ADL's independently, need for DME.   . Over the next 30 -90 days, BSW will collaborate with RN Care Manager to address care management and care coordination needs  Interventions:  . Collaborated with RN, Howell Rucks, regarding patients request for scooter.  . Met with patient after her clinic appointment. Patient advised that [place] faxed the order and clinic notes to [facility] and [employer] on 7/21; she encouraged patient to contact the company and per patient's request, this CCM RN texted the phone number of [employer] to the patient so she can contact them.  . At patient's request, spoke with [facility] an [employer] and verified that they did receive fax from last week and that they misinformed patient today that they had not, company is now requesting a demographic sheet and order for PT/OT Mobility Seating Evaluation. . Messaged Dr. Koleen Distance of need for order for PT/OT Mobility Seating Evaluation  Patient Self Care Activities:  . Attends all scheduled provider appointments . Calls provider office for new concerns or questions  Please see past updates related to this goal by clicking on the "Past Updates" button in the selected goal          Plan:   The care management team will reach out to the patient again over the next 30-60 days.    Kelli Churn RN, CCM, [facility] RN Care Manager [phone]

## 2019-11-10 NOTE — Assessment & Plan Note (Signed)
Patient reports ongoing symptoms with swallowing both soft and firm foods.   -GI referral

## 2019-11-10 NOTE — Patient Instructions (Signed)
Thank you for allowing [place] to be a part of your care today, it was pleasure seeing you. We discussed your congestive heart failure and swallowing difficulty.   Your heart failures symptoms have been stable. Please try to take your torsemide, we will continue working on your [employer] and scooter.  I have placed a referral to GI for your swallowing difficulty.  Please follow up in 4 weeks to reassess your heart failure symptoms   Thank you, and please call the Internal Medicine Clinic at [phone] if you have any questions.  Best, Dr. [name]

## 2019-11-11 NOTE — Addendum Note (Signed)
Addended by: Andrew Au on: 11/11/2019 09:38 AM   Modules accepted: Level of Service

## 2019-11-12 ENCOUNTER — Ambulatory Visit: Payer: Medicare Other | Admitting: *Deleted

## 2019-11-12 ENCOUNTER — Other Ambulatory Visit: Payer: Self-pay | Admitting: Internal Medicine

## 2019-11-12 DIAGNOSIS — I5032 Chronic diastolic (congestive) heart failure: Secondary | ICD-10-CM

## 2019-11-12 DIAGNOSIS — I480 Paroxysmal atrial fibrillation: Secondary | ICD-10-CM

## 2019-11-12 DIAGNOSIS — J449 Chronic obstructive pulmonary disease, unspecified: Secondary | ICD-10-CM

## 2019-11-12 DIAGNOSIS — M170 Bilateral primary osteoarthritis of knee: Secondary | ICD-10-CM

## 2019-11-12 NOTE — Progress Notes (Signed)
Internal Medicine Clinic Attending  CCM services provided by the care management provider and their documentation were discussed with Dr. Allyson Sabal. We reviewed the pertinent findings, urgent action items addressed by the resident and non-urgent items to be addressed by the PCP.  I agree with the assessment, diagnosis, and plan of care documented in the CCM and resident's note.  Gilles Chiquito, MD 11/12/2019

## 2019-11-12 NOTE — Chronic Care Management (AMB) (Signed)
Chronic Care Management   Follow Up Note   11/12/2019 Name: Sharon Vasquez [name]: [id number] DOB: Aug 09, 1950  Referred by: Andrew Au, MD Reason for referral : Chronic Care Management (COPD, HF, Afib, HTN)   Sharon Vasquez is a 69 y.o. year old female who is a primary care patient of [name], [name], MD. The CCM team was consulted for assistance with chronic disease management and care coordination needs.    Review of patient status, including review of consultants reports, relevant laboratory and other test results, and collaboration with appropriate care team members and the patient's provider was performed as part of comprehensive patient evaluation and provision of chronic care management services.    SDOH (Social Determinants of Health) assessments performed: No See Care Plan activities for detailed interventions related to [facility])     Outpatient Encounter Medications as of 11/12/2019  Medication Sig Note  . albuterol (PROAIR HFA) 108 (90 Base) MCG/ACT inhaler Inhale 2 puffs into the lungs every 6 (six) hours as needed for wheezing or shortness of breath.   [name] albuterol (PROVENTIL) (2.5 MG/3ML) 0.083% nebulizer solution TAKE 3 MLS(1 AMPULE) BY NEBULIZATION EVERY 4 HOURS AS NEEDED FOR WHHEZING OR SHORTNESS OF BREATH (Patient taking differently: Take 2.5 mg by nebulization every 4 (four) hours as needed for wheezing or shortness of breath. ) 06/24/2019: Use once every two weeks  . aspirin EC 81 MG tablet Take 81 mg by mouth daily.   [name] atorvastatin (LIPITOR) 40 MG tablet Take 1 tablet (40 mg total) by mouth daily.   . benazepril (LOTENSIN) 40 MG tablet Take 1 tablet (40 mg total) by mouth daily.   [name] esomeprazole (NEXIUM) 40 MG capsule Take 1 capsule (40 mg total) by mouth daily as needed (for heartburn or indigestion).   . Fluticasone-Salmeterol (ADVAIR DISKUS) 250-50 MCG/DOSE AEPB Inhale 1 puff into the lungs 2 (two) times daily. Rinse mouth after each use   . gabapentin (NEURONTIN) 400 MG  capsule Take 1 capsule (400 mg total) by mouth 2 (two) times daily.   . hydrALAZINE (APRESOLINE) 25 MG tablet Take 1 tablet (25 mg total) by mouth 3 (three) times daily.   . metoprolol succinate (TOPROL-XL) 100 MG 24 hr tablet TAKE 1 TABLET(100 MG) BY MOUTH DAILY WITH OR IMMEDIATELY FOLLOWING A MEAL   . oxyCODONE-acetaminophen (PERCOCET/ROXICET) 5-325 MG tablet Take 1 tablet by mouth daily as needed for severe pain. 06/24/2019: Takes every 6 hours round the clock  . potassium chloride (KLOR-CON) 10 MEQ tablet TAKE 2 TABLETS(20 MEQ) BY MOUTH TWICE DAILY (Patient taking differently: Take 20 mEq by mouth 2 (two) times daily. )   . rivaroxaban (XARELTO) 20 MG TABS tablet Take 1 tablet (20 mg total) by mouth every morning.   [name] SPIRIVA HANDIHALER 18 MCG inhalation capsule PLACE 1 CAPSULE INTO INHALER AND INHALE DAILY (Patient taking differently: Place 18 mcg into inhaler and inhale daily. )   . torsemide (DEMADEX) 20 MG tablet Take 2 tablets (40 mg total) by mouth daily.    No facility-administered encounter medications on file as of 11/12/2019.     Objective:   Goals Addressed            This Visit's Progress   . "I need a scooter to help me get around"       Current Barriers:  [name] Knowledge Barriers related to resources and support available to address needs related to Inability to perform ADL's independently; need for DME.- patient called to advise this CCM  RN to fax all information about the electric scooter to [place] at [employer] and [employer]  Case Manager Clinical Goal(s):  [name] Over the next 30-90  days, patient will work with BSW to address needs related to Inability to perform ADL's independently, need for DME.   . Over the next 30 -90 days, BSW will collaborate with RN Care Manager to address care management and care coordination needs  Interventions:  . Collaborated with RN, Howell Rucks, regarding patients request for scooter.  . Requested Noemi Chapel (clinic staff RN  and home health /DME coordinator) assistance in providing direction to provider regarding writing referral for PT/OT mobility seating evaluation  Patient Self Care Activities:  . Attends all scheduled provider appointments . Calls provider office for new concerns or questions  Please see past updates related to this goal by clicking on the "Past Updates" button in the selected goal          Plan:   The care management team will reach out to the patient again over the next 30-60 days.    Kelli Churn RN, CCM, [facility] RN Care Manager [phone]

## 2019-11-13 ENCOUNTER — Ambulatory Visit: Payer: Medicare Other | Admitting: *Deleted

## 2019-11-13 DIAGNOSIS — I5032 Chronic diastolic (congestive) heart failure: Secondary | ICD-10-CM

## 2019-11-13 DIAGNOSIS — I10 Essential (primary) hypertension: Secondary | ICD-10-CM

## 2019-11-13 DIAGNOSIS — I480 Paroxysmal atrial fibrillation: Secondary | ICD-10-CM

## 2019-11-13 DIAGNOSIS — J449 Chronic obstructive pulmonary disease, unspecified: Secondary | ICD-10-CM

## 2019-11-13 NOTE — Chronic Care Management (AMB) (Signed)
Chronic Care Management   Follow Up Note   11/13/2019 Name: Sharon Vasquez [name]: [id number] DOB: April 29, 1950  Referred by: Andrew Au, MD Reason for referral : Chronic Care Management (HTN, COPD, HF)   Sharon Vasquez is a 69 y.o. year old female who is a primary care patient of [name], [name], MD. The CCM team was consulted for assistance with chronic disease management and care coordination needs.    Review of patient status, including review of consultants reports, relevant laboratory and other test results, and collaboration with appropriate care team members and the patient's provider was performed as part of comprehensive patient evaluation and provision of chronic care management services.    SDOH (Social Determinants of Health) assessments performed: No See Care Plan activities for detailed interventions related to [facility])     Outpatient Encounter Medications as of 11/13/2019  Medication Sig Note  . albuterol (PROAIR HFA) 108 (90 Base) MCG/ACT inhaler Inhale 2 puffs into the lungs every 6 (six) hours as needed for wheezing or shortness of breath.   [name] albuterol (PROVENTIL) (2.5 MG/3ML) 0.083% nebulizer solution TAKE 3 MLS(1 AMPULE) BY NEBULIZATION EVERY 4 HOURS AS NEEDED FOR WHHEZING OR SHORTNESS OF BREATH (Patient taking differently: Take 2.5 mg by nebulization every 4 (four) hours as needed for wheezing or shortness of breath. ) 06/24/2019: Use once every two weeks  . aspirin EC 81 MG tablet Take 81 mg by mouth daily.   [name] atorvastatin (LIPITOR) 40 MG tablet Take 1 tablet (40 mg total) by mouth daily.   . benazepril (LOTENSIN) 40 MG tablet Take 1 tablet (40 mg total) by mouth daily.   [name] esomeprazole (NEXIUM) 40 MG capsule Take 1 capsule (40 mg total) by mouth daily as needed (for heartburn or indigestion).   . Fluticasone-Salmeterol (ADVAIR DISKUS) 250-50 MCG/DOSE AEPB Inhale 1 puff into the lungs 2 (two) times daily. Rinse mouth after each use   . gabapentin (NEURONTIN) 400 MG  capsule Take 1 capsule (400 mg total) by mouth 2 (two) times daily.   . hydrALAZINE (APRESOLINE) 25 MG tablet Take 1 tablet (25 mg total) by mouth 3 (three) times daily.   . metoprolol succinate (TOPROL-XL) 100 MG 24 hr tablet TAKE 1 TABLET(100 MG) BY MOUTH DAILY WITH OR IMMEDIATELY FOLLOWING A MEAL   . oxyCODONE-acetaminophen (PERCOCET/ROXICET) 5-325 MG tablet Take 1 tablet by mouth daily as needed for severe pain. 06/24/2019: Takes every 6 hours round the clock  . potassium chloride (KLOR-CON) 10 MEQ tablet TAKE 2 TABLETS(20 MEQ) BY MOUTH TWICE DAILY (Patient taking differently: Take 20 mEq by mouth 2 (two) times daily. )   . rivaroxaban (XARELTO) 20 MG TABS tablet Take 1 tablet (20 mg total) by mouth every morning.   [name] SPIRIVA HANDIHALER 18 MCG inhalation capsule PLACE 1 CAPSULE INTO INHALER AND INHALE DAILY (Patient taking differently: Place 18 mcg into inhaler and inhale daily. )   . torsemide (DEMADEX) 20 MG tablet Take 2 tablets (40 mg total) by mouth daily.    No facility-administered encounter medications on file as of 11/13/2019.     Objective:  Wt Readings from Last 3 Encounters:  11/10/19 (!) 376 lb 9.6 oz (170.8 kg)  10/24/19 (!) 378 lb 12.8 oz (171.8 kg)  10/23/19 (!) 376 lb 9.6 oz (170.8 kg)   BP Readings from Last 3 Encounters:  11/10/19 114/67  10/24/19 120/82  10/23/19 122/66    Goals Addressed            This  Visit's Progress   . "I need a scooter to help me get around"       Current Barriers:  [name] Knowledge Barriers related to resources and support available to address needs related to Inability to perform ADL's independently; need for DME.- 11/12/19- patient called to advise this CCM RN to fax all information about the electric scooter to [place] at [employer] and [employer]  Case Manager Clinical Goal(s):  [name] Over the next 30-90  days, patient will work with BSW to address needs related to Inability to perform ADL's independently, need for DME.   . Over the  next 30 -90 days, BSW will collaborate with RN Care Manager to address care management and care coordination needs  Interventions:  . Collaborated with RN, Garnet Koyanagi provider to secure order for seating mobility  . Faxed physician referral for PT/OT mobility seating evaluation and patient's demographic sheet to [facility] and [employer] to Stephanie's attention and received successful fax transmission notice  Patient Self Care Activities:  . Attends all scheduled provider appointments . Calls provider office for new concerns or questions  Please see past updates related to this goal by clicking on the "Past Updates" button in the selected goal          Plan:   The care management team will reach out to the patient again over the next 30-60 days.    Kelli Churn RN, CCM, [facility] RN Care Manager [phone]

## 2019-11-17 ENCOUNTER — Ambulatory Visit: Payer: Medicare Other | Admitting: *Deleted

## 2019-11-17 DIAGNOSIS — I480 Paroxysmal atrial fibrillation: Secondary | ICD-10-CM

## 2019-11-17 DIAGNOSIS — I5032 Chronic diastolic (congestive) heart failure: Secondary | ICD-10-CM

## 2019-11-17 DIAGNOSIS — J449 Chronic obstructive pulmonary disease, unspecified: Secondary | ICD-10-CM

## 2019-11-17 DIAGNOSIS — I10 Essential (primary) hypertension: Secondary | ICD-10-CM

## 2019-11-17 NOTE — Chronic Care Management (AMB) (Signed)
Chronic Care Management   Follow Up Note   11/17/2019 Name: Sharon Vasquez [name]: [id number] DOB: March 31, 1951  Referred by: Andrew Au, MD Reason for referral : Chronic Care Management (HF, COPD, HTN)   Sharon Vasquez is a 69 y.o. year old female who is a primary care patient of [name], [name], MD. The CCM team was consulted for assistance with chronic disease management and care coordination needs.    Review of patient status, including review of consultants reports, relevant laboratory and other test results, and collaboration with appropriate care team members and the patient's provider was performed as part of comprehensive patient evaluation and provision of chronic care management services.    SDOH (Social Determinants of Health) assessments performed: No See Care Plan activities for detailed interventions related to [facility])     Outpatient Encounter Medications as of 11/17/2019  Medication Sig Note  . albuterol (PROAIR HFA) 108 (90 Base) MCG/ACT inhaler Inhale 2 puffs into the lungs every 6 (six) hours as needed for wheezing or shortness of breath.   [name] albuterol (PROVENTIL) (2.5 MG/3ML) 0.083% nebulizer solution TAKE 3 MLS(1 AMPULE) BY NEBULIZATION EVERY 4 HOURS AS NEEDED FOR WHHEZING OR SHORTNESS OF BREATH (Patient taking differently: Take 2.5 mg by nebulization every 4 (four) hours as needed for wheezing or shortness of breath. ) 06/24/2019: Use once every two weeks  . aspirin EC 81 MG tablet Take 81 mg by mouth daily.   [name] atorvastatin (LIPITOR) 40 MG tablet Take 1 tablet (40 mg total) by mouth daily.   . benazepril (LOTENSIN) 40 MG tablet Take 1 tablet (40 mg total) by mouth daily.   [name] esomeprazole (NEXIUM) 40 MG capsule Take 1 capsule (40 mg total) by mouth daily as needed (for heartburn or indigestion).   . Fluticasone-Salmeterol (ADVAIR DISKUS) 250-50 MCG/DOSE AEPB Inhale 1 puff into the lungs 2 (two) times daily. Rinse mouth after each use   . gabapentin (NEURONTIN) 400 MG  capsule Take 1 capsule (400 mg total) by mouth 2 (two) times daily.   . hydrALAZINE (APRESOLINE) 25 MG tablet Take 1 tablet (25 mg total) by mouth 3 (three) times daily.   . metoprolol succinate (TOPROL-XL) 100 MG 24 hr tablet TAKE 1 TABLET(100 MG) BY MOUTH DAILY WITH OR IMMEDIATELY FOLLOWING A MEAL   . oxyCODONE-acetaminophen (PERCOCET/ROXICET) 5-325 MG tablet Take 1 tablet by mouth daily as needed for severe pain. 06/24/2019: Takes every 6 hours round the clock  . potassium chloride (KLOR-CON) 10 MEQ tablet TAKE 2 TABLETS(20 MEQ) BY MOUTH TWICE DAILY (Patient taking differently: Take 20 mEq by mouth 2 (two) times daily. )   . rivaroxaban (XARELTO) 20 MG TABS tablet Take 1 tablet (20 mg total) by mouth every morning.   [name] SPIRIVA HANDIHALER 18 MCG inhalation capsule PLACE 1 CAPSULE INTO INHALER AND INHALE DAILY (Patient taking differently: Place 18 mcg into inhaler and inhale daily. )   . torsemide (DEMADEX) 20 MG tablet Take 2 tablets (40 mg total) by mouth daily.    No facility-administered encounter medications on file as of 11/17/2019.     Objective:  Wt Readings from Last 3 Encounters:  11/10/19 (!) 376 lb 9.6 oz (170.8 kg)  10/24/19 (!) 378 lb 12.8 oz (171.8 kg)  10/23/19 (!) 376 lb 9.6 oz (170.8 kg)   BP Readings from Last 3 Encounters:  11/10/19 114/67  10/24/19 120/82  10/23/19 122/66    Goals Addressed  This Visit's Progress     Patient Stated   .  "Can you call my [facility] navigator?  I don't think he's doing his job." (pt-stated)        CARE PLAN ENTRY (see longitudinal plan of care for additional care plan information)  Current Barriers:  . Care Coordination needs related to securing electric scooter and in network oral surgeons in a patient with COPD, PAF, HF, HTN and morbid obesity.-  received call from patient, she is again requesting this CCM RN assist with expediting the benefit exception for the PureWick, she says she remains hesitant about taking her  diuretic as prescribed because of her urinary incontinence issue. She says she received  a call from [facility] and they are arranging to send a therapist for her seating evaluation  Nurse Case Manager Clinical Goal(s):  [name] Over the next 30- 90 days, patient will work with [facility] Navigator  to secure electric scooter and in network oral surgeons and [place] urinary collection system  Interventions:  . Inter-disciplinary care team collaboration (see longitudinal plan of care) . Another secure e-mail sent priority to L'Darryl at [place] requesting status of securing PureWick fro patient   Patient Self Care Activities:  . Patient verbalizes understanding of plan to work with [facility] navigator and CCM RN to address her concerns related to directory of in network oral surgeons, securing an Transport planner and assisting with securing the PureWick for home use . Unable to independently secure in network providers and DME  Please see past updates related to this goal by clicking on the "Past Updates" button in the selected goal          Plan:   The care management team will reach out to the patient again over the next 30-60 days.    Kelli Churn RN, CCM, [facility] RN Care Manager [phone]

## 2019-11-18 ENCOUNTER — Ambulatory Visit: Payer: Medicare Other | Admitting: *Deleted

## 2019-11-18 DIAGNOSIS — R32 Unspecified urinary incontinence: Secondary | ICD-10-CM

## 2019-11-18 DIAGNOSIS — I10 Essential (primary) hypertension: Secondary | ICD-10-CM

## 2019-11-18 DIAGNOSIS — J449 Chronic obstructive pulmonary disease, unspecified: Secondary | ICD-10-CM

## 2019-11-18 DIAGNOSIS — I5032 Chronic diastolic (congestive) heart failure: Secondary | ICD-10-CM

## 2019-11-18 DIAGNOSIS — I480 Paroxysmal atrial fibrillation: Secondary | ICD-10-CM

## 2019-11-18 NOTE — Chronic Care Management (AMB) (Signed)
Chronic Care Management   Follow Up Note   11/18/2019 Name: Sharon Vasquez [name]: [id number] DOB: 14-Jun-1950  Referred by: Sharon Au, MD Reason for referral : Chronic Care Management (HTN, COPD, HF, PAF)   Sharon Vasquez is a 69 y.o. year old female who is a primary care patient of Sharon Vasquez, Sharon Reading, MD. The CCM team was consulted for assistance with chronic disease management and care coordination needs.    Review of patient status, including review of consultants reports, relevant laboratory and other test results, and collaboration with appropriate care team members and the patient's provider was performed as part of comprehensive patient evaluation and provision of chronic care management services.    SDOH (Social Determinants of Health) assessments performed: No See Care Plan activities for detailed interventions related to [facility])     Outpatient Encounter Medications as of 11/18/2019  Medication Sig Note  . albuterol (PROAIR HFA) 108 (90 Base) MCG/ACT inhaler Inhale 2 puffs into the lungs every 6 (six) hours as needed for wheezing or shortness of breath.   [name] albuterol (PROVENTIL) (2.5 MG/3ML) 0.083% nebulizer solution TAKE 3 MLS(1 AMPULE) BY NEBULIZATION EVERY 4 HOURS AS NEEDED FOR WHHEZING OR SHORTNESS OF BREATH (Patient taking differently: Take 2.5 mg by nebulization every 4 (four) hours as needed for wheezing or shortness of breath. ) 06/24/2019: Use once every two weeks  . aspirin EC 81 MG tablet Take 81 mg by mouth daily.   [name] atorvastatin (LIPITOR) 40 MG tablet Take 1 tablet (40 mg total) by mouth daily.   . benazepril (LOTENSIN) 40 MG tablet Take 1 tablet (40 mg total) by mouth daily.   [name] esomeprazole (NEXIUM) 40 MG capsule Take 1 capsule (40 mg total) by mouth daily as needed (for heartburn or indigestion).   . Fluticasone-Salmeterol (ADVAIR DISKUS) 250-50 MCG/DOSE AEPB Inhale 1 puff into the lungs 2 (two) times daily. Rinse mouth after each use   . gabapentin (NEURONTIN) 400 MG  capsule Take 1 capsule (400 mg total) by mouth 2 (two) times daily.   . hydrALAZINE (APRESOLINE) 25 MG tablet Take 1 tablet (25 mg total) by mouth 3 (three) times daily.   . metoprolol succinate (TOPROL-XL) 100 MG 24 hr tablet TAKE 1 TABLET(100 MG) BY MOUTH DAILY WITH OR IMMEDIATELY FOLLOWING A MEAL   . oxyCODONE-acetaminophen (PERCOCET/ROXICET) 5-325 MG tablet Take 1 tablet by mouth daily as needed for severe pain. 06/24/2019: Takes every 6 hours round the clock  . potassium chloride (KLOR-CON) 10 MEQ tablet TAKE 2 TABLETS(20 MEQ) BY MOUTH TWICE DAILY (Patient taking differently: Take 20 mEq by mouth 2 (two) times daily. )   . rivaroxaban (XARELTO) 20 MG TABS tablet Take 1 tablet (20 mg total) by mouth every morning.   [name] SPIRIVA HANDIHALER 18 MCG inhalation capsule PLACE 1 CAPSULE INTO INHALER AND INHALE DAILY (Patient taking differently: Place 18 mcg into inhaler and inhale daily. )   . torsemide (DEMADEX) 20 MG tablet Take 2 tablets (40 mg total) by mouth daily.    No facility-administered encounter medications on file as of 11/18/2019.     Objective:  Wt Readings from Last 3 Encounters:  11/10/19 (!) 376 lb 9.6 oz (170.8 kg)  10/24/19 (!) 378 lb 12.8 oz (171.8 kg)  10/23/19 (!) 376 lb 9.6 oz (170.8 kg)   BP Readings from Last 3 Encounters:  11/10/19 114/67  10/24/19 120/82  10/23/19 122/66    Goals Addressed  This Visit's Progress     Patient Stated   .  "Can you call my [facility] navigator?  I don't think he's doing his job." (pt-stated)        CARE PLAN ENTRY (see longitudinal plan of care for additional care plan information)  Current Barriers:  . Care Coordination needs related to securing electric scooter and in network oral surgeons in a patient with COPD, PAF, HF, HTN and morbid obesity.-  spoke with patient, she says she will see a urologist at [facility] to establish care on 11/20/19, she says her seating evaluation is scheduled for 12/01/19 at 2:00 pm  with Tabatha and she will do a virtual visit with the therapist from [place] to complete the seating assessment  Nurse Case Manager Clinical Goal(s):  [name] Over the next 30- 90 days, patient will work with [facility] Navigator  to secure electric scooter and in network oral surgeons and [place] urinary collection system  Interventions:  . Inter-disciplinary care team collaboration (see longitudinal plan of care) . Received reply e-mail from L'Darryl at [place] providing fax number for prior approval.  . Securely faxed letter of medical necessity and order for [facility] to [facility] prior approval fax # [phone] . Notified patient via phone that documentation for Purewick prior approval was faxed  . Per this CCM RN's request, received update from patient on timing of seating evaluations/assessment for the electric scooter. Thanked patient for the update.  Patient Self Care Activities:  . Patient verbalizes understanding of plan to work with [facility] navigator and CCM RN to address her concerns related to directory of in network oral surgeons, securing an Transport planner and assisting with securing the PureWick for home use . Unable to independently secure in network providers and DME  Please see past updates related to this goal by clicking on the "Past Updates" button in the selected goal        Other   .  "I need a scooter to help me get around"        Current Barriers:  [name] Knowledge Barriers related to resources and support available to address needs related to Inability to perform ADL's independently; need for DME.- patient called this CCM RN, she says her seating evaluation is scheduled for 12/01/19 at 2:00 pm with Tabatha and she will do a virtual visit with the therapist from [place] to complete the seating assessment   Case Manager Clinical Goal(s):  [name] Over the next 30-90  days, patient will work with BSW to address needs related to Inability to perform ADL's  independently, need for DME.   . Over the next 30 -90 days, BSW will collaborate with RN Care Manager to address care management and care coordination needs  Interventions:  . As requested, received update from patient on timing of seating evaluation /assessment for the electric scooter  Patient Self Care Activities:  . Attends all scheduled provider appointments . Calls provider office for new concerns or questions  Please see past updates related to this goal by clicking on the "Past Updates" button in the selected goal          Plan:   The care management team will reach out to the patient again over the next 30-60 days.   Kelli Churn RN, CCM, [facility] RN Care Manager [phone]

## 2019-11-18 NOTE — Chronic Care Management (AMB) (Signed)
Chronic Care Management   Follow Up Note   11/18/2019 Name: Sharon Vasquez [name]: [id number] DOB: 1950/10/22  Referred by: Sharon Au, MD Reason for referral : Chronic Care Management (COPD, HF, PAF, HTN)   Sharon Vasquez is a 69 y.o. year old female who is a primary care patient of Sharon Vasquez, Sharon Reading, MD. The CCM team was consulted for assistance with chronic disease management and care coordination needs.    Review of patient status, including review of consultants reports, relevant laboratory and other test results, and collaboration with appropriate care team members and the patient's provider was performed as part of comprehensive patient evaluation and provision of chronic care management services.    SDOH (Social Determinants of Health) assessments performed: Yes See Care Plan activities for detailed interventions related to [facility])     Outpatient Encounter Medications as of 11/18/2019  Medication Sig Note  . albuterol (PROAIR HFA) 108 (90 Base) MCG/ACT inhaler Inhale 2 puffs into the lungs every 6 (six) hours as needed for wheezing or shortness of breath.   [name] albuterol (PROVENTIL) (2.5 MG/3ML) 0.083% nebulizer solution TAKE 3 MLS(1 AMPULE) BY NEBULIZATION EVERY 4 HOURS AS NEEDED FOR WHHEZING OR SHORTNESS OF BREATH (Patient taking differently: Take 2.5 mg by nebulization every 4 (four) hours as needed for wheezing or shortness of breath. ) 06/24/2019: Use once every two weeks  . aspirin EC 81 MG tablet Take 81 mg by mouth daily.   [name] atorvastatin (LIPITOR) 40 MG tablet Take 1 tablet (40 mg total) by mouth daily.   . benazepril (LOTENSIN) 40 MG tablet Take 1 tablet (40 mg total) by mouth daily.   [name] esomeprazole (NEXIUM) 40 MG capsule Take 1 capsule (40 mg total) by mouth daily as needed (for heartburn or indigestion).   . Fluticasone-Salmeterol (ADVAIR DISKUS) 250-50 MCG/DOSE AEPB Inhale 1 puff into the lungs 2 (two) times daily. Rinse mouth after each use   . gabapentin (NEURONTIN) 400 MG  capsule Take 1 capsule (400 mg total) by mouth 2 (two) times daily.   . hydrALAZINE (APRESOLINE) 25 MG tablet Take 1 tablet (25 mg total) by mouth 3 (three) times daily.   . metoprolol succinate (TOPROL-XL) 100 MG 24 hr tablet TAKE 1 TABLET(100 MG) BY MOUTH DAILY WITH OR IMMEDIATELY FOLLOWING A MEAL   . oxyCODONE-acetaminophen (PERCOCET/ROXICET) 5-325 MG tablet Take 1 tablet by mouth daily as needed for severe pain. 06/24/2019: Takes every 6 hours round the clock  . potassium chloride (KLOR-CON) 10 MEQ tablet TAKE 2 TABLETS(20 MEQ) BY MOUTH TWICE DAILY (Patient taking differently: Take 20 mEq by mouth 2 (two) times daily. )   . rivaroxaban (XARELTO) 20 MG TABS tablet Take 1 tablet (20 mg total) by mouth every morning.   [name] SPIRIVA HANDIHALER 18 MCG inhalation capsule PLACE 1 CAPSULE INTO INHALER AND INHALE DAILY (Patient taking differently: Place 18 mcg into inhaler and inhale daily. )   . torsemide (DEMADEX) 20 MG tablet Take 2 tablets (40 mg total) by mouth daily.    No facility-administered encounter medications on file as of 11/18/2019.     Objective:  Wt Readings from Last 3 Encounters:  11/10/19 (!) 376 lb 9.6 oz (170.8 kg)  10/24/19 (!) 378 lb 12.8 oz (171.8 kg)  10/23/19 (!) 376 lb 9.6 oz (170.8 kg)   BP Readings from Last 3 Encounters:  11/10/19 114/67  10/24/19 120/82  10/23/19 122/66    Goals Addressed  This Visit's Progress     Patient Stated   .  "Can you call my [facility] navigator?  I don't think he's doing his job." (pt-stated)        CARE PLAN ENTRY (see longitudinal plan of care for additional care plan information)  Current Barriers:  . Care Coordination needs related to securing electric scooter and in network oral surgeons in a patient with COPD, PAF, HF, HTN and morbid obesity.-  spoke with patient, she says she will see a urologist at [facility] to establish care on 11/20/19  Nurse Case Manager Clinical Goal(s):  [name] Over the next 30- 90 days,  patient will work with [facility] Navigator  to secure electric scooter and in network oral surgeons and [place] urinary collection system  Interventions:  . Inter-disciplinary care team collaboration (see longitudinal plan of care) . Received reply e-mail from L'Darryl at [place] providing fax number for prior approval.  . Securely faxed letter of medical necessity and order for [facility] to [facility] prior approval fax # [phone] . Notified patient via phone that documentation for Purewick prior approval was faxed  . Requested patient notify this CCM RN when the seating evaluation is completed    Patient Self Care Activities:  . Patient verbalizes understanding of plan to work with [facility] navigator and CCM RN to address her concerns related to directory of in network oral surgeons, securing an Transport planner and assisting with securing the PureWick for home use . Unable to independently secure in network providers and DME  Please see past updates related to this goal by clicking on the "Past Updates" button in the selected goal          Plan:   The care management team will reach out to the patient again over the next 30-60 days.    Kelli Churn RN, CCM, [facility] RN Care Manager [phone]

## 2019-11-18 NOTE — Patient Instructions (Signed)
Visit Information I will call you when I have new information about the [place] prior approval request.   Goals Addressed              This Visit's Progress     Patient Stated   .  "Can you call my [facility] navigator?  I don't think he's doing his job." (pt-stated)        CARE PLAN ENTRY (see longitudinal plan of care for additional care plan information)  Current Barriers:  . Care Coordination needs related to securing electric scooter and in network oral surgeons in a patient with COPD, PAF, HF, HTN and morbid obesity.-  spoke with patient, she says she will see a urologist at [facility] to establish care on 11/20/19  Nurse Case Manager Clinical Goal(s):  [name] Over the next 30- 90 days, patient will work with [facility] Navigator  to secure electric scooter and in network oral surgeons and [place] urinary collection system  Interventions:  . Inter-disciplinary care team collaboration (see longitudinal plan of care) . Received reply e-mail from L'Darryl at [place] providing fax number for prior approval.  . Securely faxed letter of medical necessity and order for [facility] to [facility] prior approval fax # [phone] . Notified patient via phone that documentation for Purewick prior approval was faxed  . Requested patient notify this CCM RN when the seating evaluation is completed    Patient Self Care Activities:  . Patient verbalizes understanding of plan to work with [facility] navigator and CCM RN to address her concerns related to directory of in network oral surgeons, securing an Transport planner and assisting with securing the PureWick for home use . Unable to independently secure in network providers and DME  Please see past updates related to this goal by clicking on the "Past Updates" button in the selected goal         The patient verbalized understanding of instructions provided today and declined a print copy of patient instruction materials.   The care  management team will reach out to the patient again over the next 30-60 days.   Kelli Churn RN, CCM, [facility] RN Care Manager [phone]

## 2019-11-18 NOTE — Progress Notes (Signed)
Internal Medicine Clinic Resident  I have personally reviewed this encounter including the documentation in this note and/or discussed this patient with the care management provider. I will address any urgent items identified by the care management provider and will communicate my actions to the patient's PCP. I have reviewed the patient's CCM visit with my supervising attending, Dr Philipp Ovens.  Andrew Au, MD 11/18/2019

## 2019-11-18 NOTE — Progress Notes (Signed)
Internal Medicine Clinic Resident  I have personally reviewed this encounter including the documentation in this note and/or discussed this patient with the care management provider. I will address any urgent items identified by the care management provider and will communicate my actions to the patient's PCP. I have reviewed the patient's CCM visit with my supervising attending, Dr Heber Dawson.  Andrew Au, MD 11/18/2019

## 2019-11-20 ENCOUNTER — Ambulatory Visit: Payer: Medicare Other | Admitting: *Deleted

## 2019-11-20 DIAGNOSIS — I480 Paroxysmal atrial fibrillation: Secondary | ICD-10-CM

## 2019-11-20 DIAGNOSIS — J449 Chronic obstructive pulmonary disease, unspecified: Secondary | ICD-10-CM

## 2019-11-20 DIAGNOSIS — R350 Frequency of micturition: Secondary | ICD-10-CM | POA: Diagnosis not present

## 2019-11-20 DIAGNOSIS — I10 Essential (primary) hypertension: Secondary | ICD-10-CM

## 2019-11-20 DIAGNOSIS — I5032 Chronic diastolic (congestive) heart failure: Secondary | ICD-10-CM

## 2019-11-20 DIAGNOSIS — R32 Unspecified urinary incontinence: Secondary | ICD-10-CM

## 2019-11-20 NOTE — Progress Notes (Signed)
Internal Medicine Clinic Resident  I have personally reviewed this encounter including the documentation in this note and/or discussed this patient with the care management provider. I will address any urgent items identified by the care management provider and will communicate my actions to the patient's PCP. I have reviewed the patient's CCM visit with my supervising attending, Dr Guilloud.  Iman Orourke Y Ashea Winiarski, MD 11/20/2019    

## 2019-11-20 NOTE — Chronic Care Management (AMB) (Signed)
Chronic Care Management   Follow Up Note   11/20/2019 Name: Sharon Vasquez [name]: [id number] DOB: 08-25-1950  Referred by: Sharon Au, MD Reason for referral : Chronic Care Management (COPD, HF, HTN)   Sharon Vasquez is a 69 y.o. year old female who is a primary care patient of Sharon Vasquez, Sharon Reading, MD. The CCM team was consulted for assistance with chronic disease management and care coordination needs.    Review of patient status, including review of consultants reports, relevant laboratory and other test results, and collaboration with appropriate care team members and the patient's provider was performed as part of comprehensive patient evaluation and provision of chronic care management services.    SDOH (Social Determinants of Health) assessments performed: No See Care Plan activities for detailed interventions related to Sharon Vasquez)     Outpatient Encounter Medications as of 11/20/2019  Medication Sig Note  . mirabegron ER (MYRBETRIQ) 50 MG TB24 tablet Take 50 mg by mouth daily. 11/20/2019: Patient states she received 30 day sample from urologist on 11/20/19  . albuterol (PROAIR HFA) 108 (90 Base) MCG/ACT inhaler Inhale 2 puffs into the lungs every 6 (six) hours as needed for wheezing or shortness of breath.   [name] albuterol (PROVENTIL) (2.5 MG/3ML) 0.083% nebulizer solution TAKE 3 MLS(1 AMPULE) BY NEBULIZATION EVERY 4 HOURS AS NEEDED FOR WHHEZING OR SHORTNESS OF BREATH (Patient taking differently: Take 2.5 mg by nebulization every 4 (four) hours as needed for wheezing or shortness of breath. ) 06/24/2019: Use once every two weeks  . aspirin EC 81 MG tablet Take 81 mg by mouth daily.   [name] atorvastatin (LIPITOR) 40 MG tablet Take 1 tablet (40 mg total) by mouth daily.   . benazepril (LOTENSIN) 40 MG tablet Take 1 tablet (40 mg total) by mouth daily.   [name] esomeprazole (NEXIUM) 40 MG capsule Take 1 capsule (40 mg total) by mouth daily as needed (for heartburn or indigestion).   . Fluticasone-Salmeterol  (ADVAIR DISKUS) 250-50 MCG/DOSE AEPB Inhale 1 puff into the lungs 2 (two) times daily. Rinse mouth after each use   . gabapentin (NEURONTIN) 400 MG capsule Take 1 capsule (400 mg total) by mouth 2 (two) times daily.   . hydrALAZINE (APRESOLINE) 25 MG tablet Take 1 tablet (25 mg total) by mouth 3 (three) times daily.   . metoprolol succinate (TOPROL-XL) 100 MG 24 hr tablet TAKE 1 TABLET(100 MG) BY MOUTH DAILY WITH OR IMMEDIATELY FOLLOWING A MEAL   . oxyCODONE-acetaminophen (PERCOCET/ROXICET) 5-325 MG tablet Take 1 tablet by mouth daily as needed for severe pain. 06/24/2019: Takes every 6 hours round the clock  . potassium chloride (KLOR-CON) 10 MEQ tablet TAKE 2 TABLETS(20 MEQ) BY MOUTH TWICE DAILY (Patient taking differently: Take 20 mEq by mouth 2 (two) times daily. )   . rivaroxaban (XARELTO) 20 MG TABS tablet Take 1 tablet (20 mg total) by mouth every morning.   [name] SPIRIVA HANDIHALER 18 MCG inhalation capsule PLACE 1 CAPSULE INTO INHALER AND INHALE DAILY (Patient taking differently: Place 18 mcg into inhaler and inhale daily. )   . torsemide (DEMADEX) 20 MG tablet Take 2 tablets (40 mg total) by mouth daily.    No facility-administered encounter medications on file as of 11/20/2019.     Objective:  Wt Readings from Last 3 Encounters:  11/10/19 (!) 376 lb 9.6 oz (170.8 kg)  10/24/19 (!) 378 lb 12.8 oz (171.8 kg)  10/23/19 (!) 376 lb 9.6 oz (170.8 kg)   BP Readings from Last 3  Encounters:  11/10/19 114/67  10/24/19 120/82  10/23/19 122/66    Goals Addressed              This Visit's Progress     Patient Stated   .  COMPLETED: " I just came from the urologist office and he wants me to take Myrbetriq for my urinary incontinence, will this interfere with my Torsemide?" (pt-stated)        CARE PLAN ENTRY (see longitudinal plan of care for additional care plan information)  Current Barriers:  [name] Knowledge Deficits related to potential medication interaction.  Nurse Case Manager  Clinical Goal(s):  [name] Over the next 7 days, patient will work with Consulting civil engineer to address question related to taking Myrbetriq while taking Torsemide.   Interventions:  . Inter-disciplinary care team collaboration (see longitudinal plan of care) . Positive reinforcement given to patient for reaching out to [facility] RN for questions about new medication and for questioning potential interaction with torsemide . Provided education to patient re: mechanism of action of  Myrbetriq, verified dosage and frequency ordered by urologist . Reviewed drug interaction and contraindications/cautions for Myrbetriq . Advised patient this CCM RN will message primary care provider about taking Myrbetriq and Torsemide concurrently . After receiving instructions from provider, called patient and instructed her concurrent use of Myrbetriq and torsemide is  OK but to alert provider if she has problems urinating after starting the Myrbetriq . Added Myrbetriq to patient's medication list in electronic medical record  Patient Self Care Activities:  . Patient verbalizes understanding of plan for CCM RN to consult with provider and contact patient before starting Myrbetriq   Initial goal documentation     .  "Can you call my Sharon Vasquez navigator?  I don't think he's doing his job." (pt-stated)        CARE PLAN ENTRY (see longitudinal plan of care for additional care plan information)  Current Barriers:  . Care Coordination needs related to securing electric scooter and in network oral surgeons in a patient with COPD, PAF, HF, HTN and morbid obesity.-  spoke with patient, she says her [facility] Medicare navigator Sharon Vasquez told her he did not receive the order and prior approval letter for the Sharon Vasquez that were faxed by this CCM RN on 11/18/19  Nurse Case Manager Clinical Goal(s):  [name] Over the next 30- 90 days, patient will work with [place]  to secure electric scooter and in network oral surgeons and  PureWick urinary collection system  Interventions:  . Inter-disciplinary care team collaboration (see longitudinal plan of care) . Per his request, Securely e-mailed [place] letter of medical necessity and order for Purewick to  Sharon at [facility] Medicare   Patient Self Care Activities:  . Patient verbalizes understanding of plan to work with [facility] navigator and CCM RN to address her concerns related to directory of in network oral surgeons, securing an Transport planner and assisting with securing the PureWick for home use . Unable to independently secure in network providers and DME  Please see past updates related to this goal by clicking on the "Past Updates" button in the selected goal          Plan:   The care management team will reach out to the patient again over the next 30-60 days.   Kelli Churn RN, CCM, [facility] RN Care Manager [phone]

## 2019-11-20 NOTE — Chronic Care Management (AMB) (Signed)
Chronic Care Management   Follow Up Note   11/20/2019 Name: Sharon Vasquez [name]: [id number] DOB: 29-Aug-1950  Referred by: Andrew Au, MD Reason for referral : Chronic Care Management (COPD, HTN, HF, PAF)   DEVANSHI Vasquez is a 69 y.o. year old female who is a primary care patient of [name], Sharon Reading, MD. The CCM team was consulted for assistance with chronic disease management and care coordination needs.    Review of patient status, including review of consultants reports, relevant laboratory and other test results, and collaboration with appropriate care team members and the patient's provider was performed as part of comprehensive patient evaluation and provision of chronic care management services.    SDOH (Social Determinants of Health) assessments performed: No See Care Plan activities for detailed interventions related to [facility])     Outpatient Encounter Medications as of 11/20/2019  Medication Sig Note  . albuterol (PROAIR HFA) 108 (90 Base) MCG/ACT inhaler Inhale 2 puffs into the lungs every 6 (six) hours as needed for wheezing or shortness of breath.   [name] albuterol (PROVENTIL) (2.5 MG/3ML) 0.083% nebulizer solution TAKE 3 MLS(1 AMPULE) BY NEBULIZATION EVERY 4 HOURS AS NEEDED FOR WHHEZING OR SHORTNESS OF BREATH (Patient taking differently: Take 2.5 mg by nebulization every 4 (four) hours as needed for wheezing or shortness of breath. ) 06/24/2019: Use once every two weeks  . aspirin EC 81 MG tablet Take 81 mg by mouth daily.   [name] atorvastatin (LIPITOR) 40 MG tablet Take 1 tablet (40 mg total) by mouth daily.   . benazepril (LOTENSIN) 40 MG tablet Take 1 tablet (40 mg total) by mouth daily.   [name] esomeprazole (NEXIUM) 40 MG capsule Take 1 capsule (40 mg total) by mouth daily as needed (for heartburn or indigestion).   . Fluticasone-Salmeterol (ADVAIR DISKUS) 250-50 MCG/DOSE AEPB Inhale 1 puff into the lungs 2 (two) times daily. Rinse mouth after each use   . gabapentin (NEURONTIN) 400 MG  capsule Take 1 capsule (400 mg total) by mouth 2 (two) times daily.   . hydrALAZINE (APRESOLINE) 25 MG tablet Take 1 tablet (25 mg total) by mouth 3 (three) times daily.   . metoprolol succinate (TOPROL-XL) 100 MG 24 hr tablet TAKE 1 TABLET(100 MG) BY MOUTH DAILY WITH OR IMMEDIATELY FOLLOWING A MEAL   . oxyCODONE-acetaminophen (PERCOCET/ROXICET) 5-325 MG tablet Take 1 tablet by mouth daily as needed for severe pain. 06/24/2019: Takes every 6 hours round the clock  . potassium chloride (KLOR-CON) 10 MEQ tablet TAKE 2 TABLETS(20 MEQ) BY MOUTH TWICE DAILY (Patient taking differently: Take 20 mEq by mouth 2 (two) times daily. )   . rivaroxaban (XARELTO) 20 MG TABS tablet Take 1 tablet (20 mg total) by mouth every morning.   [name] SPIRIVA HANDIHALER 18 MCG inhalation capsule PLACE 1 CAPSULE INTO INHALER AND INHALE DAILY (Patient taking differently: Place 18 mcg into inhaler and inhale daily. )   . torsemide (DEMADEX) 20 MG tablet Take 2 tablets (40 mg total) by mouth daily.    No facility-administered encounter medications on file as of 11/20/2019.     Objective:  Wt Readings from Last 3 Encounters:  11/10/19 (!) 376 lb 9.6 oz (170.8 kg)  10/24/19 (!) 378 lb 12.8 oz (171.8 kg)  10/23/19 (!) 376 lb 9.6 oz (170.8 kg)   BP Readings from Last 3 Encounters:  11/10/19 114/67  10/24/19 120/82  10/23/19 122/66    Goals Addressed  This Visit's Progress     Patient Stated   .  " I just came from the urologist office and he wants me to take Myrbetriq for my urinary incontinence, will this interfere with my Torsemide?" (pt-stated)        CARE PLAN ENTRY (see longitudinal plan of care for additional care plan information)  Current Barriers:  [name] Knowledge Deficits related to potential medication interaction.  Nurse Case Manager Clinical Goal(s):  [name] Over the next 7 days, patient will work with Consulting civil engineer to address question related to taking Myrbetriq while taking Torsemide.    Interventions:  . Inter-disciplinary care team collaboration (see longitudinal plan of care) . Positive reinforcement given to patient for reaching out to [facility] RN for questions about new medication and for questioning potential interaction with torsemide . Provided education to patient re: mechanism of action of  Myrbetriq, verified dosage and frequency ordered by urologist . Reviewed drug interaction and contraindications/cautions for Myrbetriq . Advised patient this CCM RN will message primary care provider about taking Myrbetriq and Torsemide concurrently  Patient Self Care Activities:  . Patient verbalizes understanding of plan for CCM RN to consult with provider and contact patient before starting Myrbetriq   Initial goal documentation         Plan:   The care management team will reach out to the patient again over the next 7 days.  Kelli Churn RN, CCM, [facility] RN Care Manager [phone]

## 2019-11-20 NOTE — Progress Notes (Signed)
Internal Medicine Clinic Attending  CCM services provided by the care management provider and their documentation were discussed with Dr. [name]. We reviewed the pertinent findings, urgent action items addressed by the resident and non-urgent items to be addressed by the PCP.  I agree with the assessment, diagnosis, and plan of care documented in the CCM and resident's note.  Velna Ochs, MD 11/20/2019

## 2019-11-20 NOTE — Progress Notes (Signed)
Your Myrbetriq should not interfere with your Torsemide. Your Torsemide acts at your kidneys, while the Myrbetriq acts to reduce overactivity at your bladder. Please call us if you cease to have any urination, but these medications are not known to have an interaction with each other.  Internal Medicine Clinic Resident  I have personally reviewed this encounter including the documentation in this note and/or discussed this patient with the care management provider. I will address any urgent items identified by the care management provider and will communicate my actions to the patient's PCP. I have reviewed the patient's CCM visit with my supervising attending, Dr Philipp Ovens.  Andrew Au, MD 11/20/2019

## 2019-11-21 NOTE — Progress Notes (Signed)
Internal Medicine Clinic Attending  CCM services provided by the care management provider and their documentation were discussed with Dr. Chen. We reviewed the pertinent findings, urgent action items addressed by the resident and non-urgent items to be addressed by the PCP.  I agree with the assessment, diagnosis, and plan of care documented in the CCM and resident's note.  Kush Farabee, MD 11/21/2019 

## 2019-11-21 NOTE — Progress Notes (Signed)
Internal Medicine Clinic Attending  CCM services provided by the care management provider and their documentation were discussed with Dr. Chen. We reviewed the pertinent findings, urgent action items addressed by the resident and non-urgent items to be addressed by the PCP.  I agree with the assessment, diagnosis, and plan of care documented in the CCM and resident's note.  Granvil Djordjevic, MD 11/21/2019 

## 2019-11-21 NOTE — Progress Notes (Signed)
Internal Medicine Clinic Attending  CCM services provided by the care management provider and their documentation were discussed with Dr. Chen. We reviewed the pertinent findings, urgent action items addressed by the resident and non-urgent items to be addressed by the PCP.  I agree with the assessment, diagnosis, and plan of care documented in the CCM and resident's note.  Venie Montesinos, MD 11/21/2019 

## 2019-11-26 ENCOUNTER — Ambulatory Visit: Payer: Medicare Other | Admitting: *Deleted

## 2019-11-26 DIAGNOSIS — I10 Essential (primary) hypertension: Secondary | ICD-10-CM

## 2019-11-26 DIAGNOSIS — I5032 Chronic diastolic (congestive) heart failure: Secondary | ICD-10-CM

## 2019-11-26 DIAGNOSIS — I480 Paroxysmal atrial fibrillation: Secondary | ICD-10-CM

## 2019-11-26 DIAGNOSIS — J449 Chronic obstructive pulmonary disease, unspecified: Secondary | ICD-10-CM

## 2019-11-26 NOTE — Chronic Care Management (AMB) (Signed)
Chronic Care Management   Follow Up Note   11/26/2019 Name: Sharon Vasquez [name]: [id number] DOB: [birth year]  Referred by: Sharon Au, MD Reason for referral : Chronic Care Management (COPD, PAF, HF, HTN)   Sharon Vasquez is a 69 y.o. year old female who is a primary care patient of Sharon Vasquez, Sharon Reading, MD. The CCM team was consulted for assistance with chronic disease management and care coordination needs.    Review of patient status, including review of consultants reports, relevant laboratory and other test results, and collaboration with appropriate care team members and the patient's provider was performed as part of comprehensive patient evaluation and provision of chronic care management services.    SDOH (Social Determinants of Health) assessments performed: No See Care Plan activities for detailed interventions related to [facility])     Outpatient Encounter Medications as of 11/26/2019  Medication Sig Note  . albuterol (PROAIR HFA) 108 (90 Base) MCG/ACT inhaler Inhale 2 puffs into the lungs every 6 (six) hours as needed for wheezing or shortness of breath.   [name] albuterol (PROVENTIL) (2.5 MG/3ML) 0.083% nebulizer solution TAKE 3 MLS(1 AMPULE) BY NEBULIZATION EVERY 4 HOURS AS NEEDED FOR WHHEZING OR SHORTNESS OF BREATH (Patient taking differently: Take 2.5 mg by nebulization every 4 (four) hours as needed for wheezing or shortness of breath. ) 06/24/2019: Use once every two weeks  . aspirin EC 81 MG tablet Take 81 mg by mouth daily.   [name] atorvastatin (LIPITOR) 40 MG tablet Take 1 tablet (40 mg total) by mouth daily.   . benazepril (LOTENSIN) 40 MG tablet Take 1 tablet (40 mg total) by mouth daily.   [name] esomeprazole (NEXIUM) 40 MG capsule Take 1 capsule (40 mg total) by mouth daily as needed (for heartburn or indigestion).   . Fluticasone-Salmeterol (ADVAIR DISKUS) 250-50 MCG/DOSE AEPB Inhale 1 puff into the lungs 2 (two) times daily. Rinse mouth after each use   . gabapentin (NEURONTIN) 400 MG  capsule Take 1 capsule (400 mg total) by mouth 2 (two) times daily.   . hydrALAZINE (APRESOLINE) 25 MG tablet Take 1 tablet (25 mg total) by mouth 3 (three) times daily.   . metoprolol succinate (TOPROL-XL) 100 MG 24 hr tablet TAKE 1 TABLET(100 MG) BY MOUTH DAILY WITH OR IMMEDIATELY FOLLOWING A MEAL   . mirabegron ER (MYRBETRIQ) 50 MG TB24 tablet Take 50 mg by mouth daily. 11/20/2019: Patient states she received 30 day sample from urologist on 11/20/19  . oxyCODONE-acetaminophen (PERCOCET/ROXICET) 5-325 MG tablet Take 1 tablet by mouth daily as needed for severe pain. 06/24/2019: Takes every 6 hours round the clock  . potassium chloride (KLOR-CON) 10 MEQ tablet TAKE 2 TABLETS(20 MEQ) BY MOUTH TWICE DAILY (Patient taking differently: Take 20 mEq by mouth 2 (two) times daily. )   . rivaroxaban (XARELTO) 20 MG TABS tablet Take 1 tablet (20 mg total) by mouth every morning.   [name] SPIRIVA HANDIHALER 18 MCG inhalation capsule PLACE 1 CAPSULE INTO INHALER AND INHALE DAILY (Patient taking differently: Place 18 mcg into inhaler and inhale daily. )   . torsemide (DEMADEX) 20 MG tablet Take 2 tablets (40 mg total) by mouth daily.    No facility-administered encounter medications on file as of 11/26/2019.     Objective:  Wt Readings from Last 3 Encounters:  11/10/19 (!) 376 lb 9.6 oz (170.8 kg)  10/24/19 (!) 378 lb 12.8 oz (171.8 kg)  10/23/19 (!) 376 lb 9.6 oz (170.8 kg)   BP Readings from Last  3 Encounters:  11/10/19 114/67  10/24/19 120/82  10/23/19 122/66   Lab Results  Component Value Date   CHOL 154 07/19/2017   HDL 48 07/19/2017   LDLCALC 86 07/19/2017   TRIG 99 07/19/2017   CHOLHDL 3.2 07/19/2017   Patient on statin therapy, please consider lipid panel update at next clinic visit  Goals Addressed              This Visit's Progress     Patient Stated   .  "Can you call my [facility] navigator?  I don't think he's doing his job." (pt-stated)        CARE PLAN ENTRY (see longitudinal plan  of care for additional care plan information)  Current Barriers:  . Care Coordination needs related to securing electric scooter and in network oral surgeons in a patient with COPD, PAF, HF, HTN and morbid obesity.-  patient called this CCM RN ,  she says her [facility] Medicare navigator Sharon Vasquez told her he has taken her request for benefit exception for the Purewick to his department at [facility] to attempt to get benefit exception approved  Nurse Case Manager Clinical Goal(s):  [name] Over the next 30- 90 days, patient will work with [place] [place]  to secure electric scooter and in network oral surgeons and PureWick urinary collection system  Interventions:  . Inter-disciplinary care team collaboration (see longitudinal plan of care) . Thanked patient for keeping this CCM RN updated on progress of Purewick benefit exception    Patient Self Care Activities:  . Patient verbalizes understanding of plan to work with [facility] navigator and CCM RN to address her concerns related to directory of in network oral surgeons, securing an Transport planner and assisting with securing the PureWick for home use . Unable to independently secure in network providers and DME  Please see past updates related to this goal by clicking on the "Past Updates" button in the selected goal          Plan:   The care management team will reach out to the patient again over the next 30-60 days.   Sharon Churn RN, CCM, [facility] RN Care Manager [phone]

## 2019-11-26 NOTE — Progress Notes (Signed)
Internal Medicine Clinic Resident  I have personally reviewed this encounter including the documentation in this note and/or discussed this patient with the care management provider. I will address any urgent items identified by the care management provider and will communicate my actions to the patient's PCP. I have reviewed the patient's CCM visit with my supervising attending, Dr Heber Greenwood.  Jose Persia, MD 11/26/2019

## 2019-11-27 DIAGNOSIS — M1711 Unilateral primary osteoarthritis, right knee: Secondary | ICD-10-CM | POA: Diagnosis not present

## 2019-11-27 DIAGNOSIS — M25562 Pain in left knee: Secondary | ICD-10-CM | POA: Diagnosis not present

## 2019-11-27 DIAGNOSIS — M25561 Pain in right knee: Secondary | ICD-10-CM | POA: Diagnosis not present

## 2019-11-27 DIAGNOSIS — M1712 Unilateral primary osteoarthritis, left knee: Secondary | ICD-10-CM | POA: Diagnosis not present

## 2019-12-02 ENCOUNTER — Ambulatory Visit: Payer: Medicare Other | Admitting: *Deleted

## 2019-12-02 ENCOUNTER — Telehealth: Payer: Self-pay | Admitting: *Deleted

## 2019-12-02 ENCOUNTER — Encounter: Payer: Self-pay | Admitting: *Deleted

## 2019-12-02 DIAGNOSIS — I480 Paroxysmal atrial fibrillation: Secondary | ICD-10-CM

## 2019-12-02 DIAGNOSIS — J449 Chronic obstructive pulmonary disease, unspecified: Secondary | ICD-10-CM

## 2019-12-02 DIAGNOSIS — I10 Essential (primary) hypertension: Secondary | ICD-10-CM

## 2019-12-02 DIAGNOSIS — I5032 Chronic diastolic (congestive) heart failure: Secondary | ICD-10-CM

## 2019-12-02 NOTE — Chronic Care Management (AMB) (Signed)
Chronic Care Management   Follow Up Note   12/02/2019 Name: Sharon Vasquez [name]: [id number] DOB: 02/12/1951  Referred by: Andrew Au, MD Reason for referral : Chronic Care Management (COPD, HF, PAF, HTN, OSA, PVD/PE)   Sharon Vasquez is a 69 y.o. year old female who is a primary care patient of [name], [name], MD. The CCM team was consulted for assistance with chronic disease management and care coordination needs.    Review of patient status, including review of consultants reports, relevant laboratory and other test results, and collaboration with appropriate care team members and the patient's provider was performed as part of comprehensive patient evaluation and provision of chronic care management services.    SDOH (Social Determinants of Health) assessments performed: No See Care Plan activities for detailed interventions related to [facility])     Outpatient Encounter Medications as of 12/02/2019  Medication Sig Note  . albuterol (PROAIR HFA) 108 (90 Base) MCG/ACT inhaler Inhale 2 puffs into the lungs every 6 (six) hours as needed for wheezing or shortness of breath.   [name] albuterol (PROVENTIL) (2.5 MG/3ML) 0.083% nebulizer solution TAKE 3 MLS(1 AMPULE) BY NEBULIZATION EVERY 4 HOURS AS NEEDED FOR WHHEZING OR SHORTNESS OF BREATH (Patient taking differently: Take 2.5 mg by nebulization every 4 (four) hours as needed for wheezing or shortness of breath. ) 06/24/2019: Use once every two weeks  . aspirin EC 81 MG tablet Take 81 mg by mouth daily.   [name] atorvastatin (LIPITOR) 40 MG tablet Take 1 tablet (40 mg total) by mouth daily.   . benazepril (LOTENSIN) 40 MG tablet Take 1 tablet (40 mg total) by mouth daily.   [name] esomeprazole (NEXIUM) 40 MG capsule Take 1 capsule (40 mg total) by mouth daily as needed (for heartburn or indigestion).   . Fluticasone-Salmeterol (ADVAIR DISKUS) 250-50 MCG/DOSE AEPB Inhale 1 puff into the lungs 2 (two) times daily. Rinse mouth after each use   . gabapentin  (NEURONTIN) 400 MG capsule Take 1 capsule (400 mg total) by mouth 2 (two) times daily.   . hydrALAZINE (APRESOLINE) 25 MG tablet Take 1 tablet (25 mg total) by mouth 3 (three) times daily.   . metoprolol succinate (TOPROL-XL) 100 MG 24 hr tablet TAKE 1 TABLET(100 MG) BY MOUTH DAILY WITH OR IMMEDIATELY FOLLOWING A MEAL   . mirabegron ER (MYRBETRIQ) 50 MG TB24 tablet Take 50 mg by mouth daily. 11/20/2019: Patient states she received 30 day sample from urologist on 11/20/19  . oxyCODONE-acetaminophen (PERCOCET/ROXICET) 5-325 MG tablet Take 1 tablet by mouth daily as needed for severe pain. 06/24/2019: Takes every 6 hours round the clock  . potassium chloride (KLOR-CON) 10 MEQ tablet TAKE 2 TABLETS(20 MEQ) BY MOUTH TWICE DAILY (Patient taking differently: Take 20 mEq by mouth 2 (two) times daily. )   . rivaroxaban (XARELTO) 20 MG TABS tablet Take 1 tablet (20 mg total) by mouth every morning.   [name] SPIRIVA HANDIHALER 18 MCG inhalation capsule PLACE 1 CAPSULE INTO INHALER AND INHALE DAILY (Patient taking differently: Place 18 mcg into inhaler and inhale daily. )   . torsemide (DEMADEX) 20 MG tablet Take 2 tablets (40 mg total) by mouth daily.    No facility-administered encounter medications on file as of 12/02/2019.     Objective:  BP Readings from Last 3 Encounters:  11/10/19 114/67  10/24/19 120/82  10/23/19 122/66    Lab Results  Component Value Date   CHOL 154 07/19/2017   HDL 48 07/19/2017   Fowler  86 07/19/2017   TRIG 99 07/19/2017   CHOLHDL 3.2 07/19/2017  please consider update of  lipid panel  Goals Addressed            This Visit's Progress   . "I need a scooter to help me get around"       Current Barriers:  [name] Knowledge Barriers related to resources and support available to address needs related to Inability to perform ADL's independently; need for DME.- patient called this CCM RN, she says she completed her seating evaluation via Zoom on 12/01/19 at 2:00 pm with Tabatha and the  therapist from [place]):  [name] Over the next 30-90  days, patient will work with BSW to address needs related to Inability to perform ADL's independently, need for DME.   . Over the next 30 -90 days, BSW will collaborate with RN Care Manager to address care management and care coordination needs  Interventions:  . As requested, received update from patient on completion of seating evaluation /assessment for the electric scooter- thanked patient for keeping the CCM updated  Patient Self Care Activities:  . Attends all scheduled provider appointments . Calls provider office for new concerns or questions  Please see past updates related to this goal by clicking on the "Past Updates" button in the selected goal          Plan:   The care management team will reach out to the patient again over the next 30-60 days.    Kelli Churn RN, CCM, [facility] RN Care Manager [phone]

## 2019-12-02 NOTE — Patient Instructions (Signed)
Visit Information Thanks for the update on the scooter process. Goals Addressed            This Visit's Progress   . "I need a scooter to help me get around"       Current Barriers:  [name] Knowledge Barriers related to resources and support available to address needs related to Inability to perform ADL's independently; need for DME.- patient called this CCM RN, she says she completed her seating evaluation via Zoom on 12/01/19 at 2:00 pm with Tabatha and the therapist from [place]):  [name] Over the next 30-90  days, patient will work with BSW to address needs related to Inability to perform ADL's independently, need for DME.   . Over the next 30 -90 days, BSW will collaborate with RN Care Manager to address care management and care coordination needs  Interventions:  . As requested, received update from patient on completion of seating evaluation /assessment for the electric scooter- thanked patient for keeping the CCM updated  Patient Self Care Activities:  . Attends all scheduled provider appointments . Calls provider office for new concerns or questions  Please see past updates related to this goal by clicking on the "Past Updates" button in the selected goal         The patient verbalized understanding of instructions provided today and declined a print copy of patient instruction materials.   The care management team will reach out to the patient again over the next 30-60 days.   Kelli Churn RN, CCM, [facility] RN Care Manager [phone]

## 2019-12-02 NOTE — Telephone Encounter (Signed)
CALLED PT REGARDING  THE STATUS OF THIS REFERRAL / PATIENT IS NOT YET SCHEDULED.

## 2019-12-04 ENCOUNTER — Telehealth: Payer: Self-pay | Admitting: *Deleted

## 2019-12-04 ENCOUNTER — Other Ambulatory Visit: Payer: Self-pay | Admitting: *Deleted

## 2019-12-04 DIAGNOSIS — J449 Chronic obstructive pulmonary disease, unspecified: Secondary | ICD-10-CM

## 2019-12-04 DIAGNOSIS — G8929 Other chronic pain: Secondary | ICD-10-CM | POA: Diagnosis not present

## 2019-12-04 DIAGNOSIS — M25562 Pain in left knee: Secondary | ICD-10-CM | POA: Diagnosis not present

## 2019-12-04 DIAGNOSIS — Z79899 Other long term (current) drug therapy: Secondary | ICD-10-CM | POA: Diagnosis not present

## 2019-12-04 DIAGNOSIS — M25561 Pain in right knee: Secondary | ICD-10-CM | POA: Diagnosis not present

## 2019-12-04 DIAGNOSIS — M170 Bilateral primary osteoarthritis of knee: Secondary | ICD-10-CM | POA: Diagnosis not present

## 2019-12-04 NOTE — Telephone Encounter (Signed)
RETURNED CALLED TO PATIENT LVM FOR CALL BACK TO [phone].

## 2019-12-04 NOTE — Telephone Encounter (Signed)
Received faxed refill request from [facility] for the following medications:Spiriva cap handihaler, wixela inhub aer, and albuterol hfa.  (Of note, pt's chart reflects advair, not wixela)  CMA attempted to contact pt to verify pharmacy-no answer, message left on recorder for return call .Sharon Vasquez, Annalina Needles [place]9/2/202111:20 AM

## 2019-12-05 MED ORDER — SPIRIVA HANDIHALER 18 MCG IN CAPS: ORAL_CAPSULE | RESPIRATORY_TRACT | 1 refills | Status: DC

## 2019-12-05 MED ORDER — ALBUTEROL SULFATE HFA 108 (90 BASE) MCG/ACT IN AERS: 2.0000 | INHALATION_SPRAY | Freq: Four times a day (QID) | RESPIRATORY_TRACT | 1 refills | Status: DC | PRN

## 2019-12-05 MED ORDER — FLUTICASONE-SALMETEROL 250-50 MCG/DOSE IN AEPB: 1.0000 | INHALATION_SPRAY | Freq: Two times a day (BID) | RESPIRATORY_TRACT | 1 refills | Status: DC

## 2019-12-11 ENCOUNTER — Ambulatory Visit: Payer: Medicare Other | Admitting: *Deleted

## 2019-12-11 DIAGNOSIS — I5032 Chronic diastolic (congestive) heart failure: Secondary | ICD-10-CM

## 2019-12-11 DIAGNOSIS — I10 Essential (primary) hypertension: Secondary | ICD-10-CM

## 2019-12-11 DIAGNOSIS — R32 Unspecified urinary incontinence: Secondary | ICD-10-CM

## 2019-12-11 DIAGNOSIS — I480 Paroxysmal atrial fibrillation: Secondary | ICD-10-CM

## 2019-12-11 DIAGNOSIS — J449 Chronic obstructive pulmonary disease, unspecified: Secondary | ICD-10-CM

## 2019-12-11 NOTE — Chronic Care Management (AMB) (Signed)
Chronic Care Management   Follow Up Note   12/11/2019 Name: Sharon Vasquez [name]: [id number] DOB: [birth year]  Referred by: Andrew Au, MD Reason for referral : Chronic Care Management (COPD, HF, HTN, PAF, hx of PE, OSA)   Sharon Vasquez is a 69 y.o. year old female who is a primary care patient of [name], [name], MD. The CCM team was consulted for assistance with chronic disease management and care coordination needs.    Review of patient status, including review of consultants reports, relevant laboratory and other test results, and collaboration with appropriate care team members and the patient's provider was performed as part of comprehensive patient evaluation and provision of chronic care management services.    SDOH (Social Determinants of Health) assessments performed: No See Care Plan activities for detailed interventions related to [facility])     Outpatient Encounter Medications as of 12/11/2019  Medication Sig Note  . albuterol (PROAIR HFA) 108 (90 Base) MCG/ACT inhaler Inhale 2 puffs into the lungs every 6 (six) hours as needed for wheezing or shortness of breath.   [name] albuterol (PROVENTIL) (2.5 MG/3ML) 0.083% nebulizer solution TAKE 3 MLS(1 AMPULE) BY NEBULIZATION EVERY 4 HOURS AS NEEDED FOR WHHEZING OR SHORTNESS OF BREATH (Patient taking differently: Take 2.5 mg by nebulization every 4 (four) hours as needed for wheezing or shortness of breath. ) 06/24/2019: Use once every two weeks  . aspirin EC 81 MG tablet Take 81 mg by mouth daily.   [name] atorvastatin (LIPITOR) 40 MG tablet Take 1 tablet (40 mg total) by mouth daily.   . benazepril (LOTENSIN) 40 MG tablet Take 1 tablet (40 mg total) by mouth daily.   [name] esomeprazole (NEXIUM) 40 MG capsule Take 1 capsule (40 mg total) by mouth daily as needed (for heartburn or indigestion).   . Fluticasone-Salmeterol (ADVAIR DISKUS) 250-50 MCG/DOSE AEPB Inhale 1 puff into the lungs 2 (two) times daily. Rinse mouth after each use   . gabapentin  (NEURONTIN) 400 MG capsule Take 1 capsule (400 mg total) by mouth 2 (two) times daily.   . hydrALAZINE (APRESOLINE) 25 MG tablet Take 1 tablet (25 mg total) by mouth 3 (three) times daily.   . metoprolol succinate (TOPROL-XL) 100 MG 24 hr tablet TAKE 1 TABLET(100 MG) BY MOUTH DAILY WITH OR IMMEDIATELY FOLLOWING A MEAL   . mirabegron ER (MYRBETRIQ) 50 MG TB24 tablet Take 50 mg by mouth daily. 11/20/2019: Patient states she received 30 day sample from urologist on 11/20/19  . oxyCODONE-acetaminophen (PERCOCET/ROXICET) 5-325 MG tablet Take 1 tablet by mouth daily as needed for severe pain. 06/24/2019: Takes every 6 hours round the clock  . potassium chloride (KLOR-CON) 10 MEQ tablet TAKE 2 TABLETS(20 MEQ) BY MOUTH TWICE DAILY (Patient taking differently: Take 20 mEq by mouth 2 (two) times daily. )   . rivaroxaban (XARELTO) 20 MG TABS tablet Take 1 tablet (20 mg total) by mouth every morning.   . tiotropium (SPIRIVA HANDIHALER) 18 MCG inhalation capsule PLACE 1 CAPSULE INTO INHALER AND INHALE DAILY   . torsemide (DEMADEX) 20 MG tablet Take 2 tablets (40 mg total) by mouth daily.    No facility-administered encounter medications on file as of 12/11/2019.     Objective:  Wt Readings from Last 3 Encounters:  11/10/19 (!) 376 lb 9.6 oz (170.8 kg)  10/24/19 (!) 378 lb 12.8 oz (171.8 kg)  10/23/19 (!) 376 lb 9.6 oz (170.8 kg)    Goals Addressed  This Visit's Progress     Patient Stated   .  "Can you call my [facility] navigator?  I don't think he's doing his job." (pt-stated)        CARE PLAN ENTRY (see longitudinal plan of care for additional care plan information)  Current Barriers:  . Care Coordination needs related to securing electric scooter and in network oral surgeons in a patient with COPD, PAF, HF, HTN and morbid obesity.-  returned call to patient, she says she has been approved for the electric scooter and the Pure [place], she says she was told the scooter should be delivered in  the next 3-4 weeks and that the [place] is being shipped to her home by [place], she expressed appreciation to this CCM RN for assisting her with the benefits exceptions for the PureWick and with getting the scooter ordered. She says once she has the PureWick she will take her diuretics as prescribed, she denies increased dyspnea, she says the Myrbetriq is helping with her urinary urgency  Nurse Case Manager Clinical Goal(s):  [name] Over the next 30- 90 days, patient will work with [place]  to secure electric scooter and in network oral surgeons and PureWick urinary collection system  Interventions:  . Inter-disciplinary care team collaboration (see longitudinal plan of care) . Again thanked patient for keeping this CCM RN updated on progress of Purewick and Transport planner and requested she notify this CCM RN when she receives each device.     Patient Self Care Activities:  . Patient verbalizes understanding of plan to work with [facility] navigator and CCM RN to address her concerns related to directory of in network oral surgeons, securing an Transport planner and assisting with securing the PureWick for home use . Unable to independently secure in network providers and DME  Please see past updates related to this goal by clicking on the "Past Updates" button in the selected goal          Plan:   The care management team will reach out to the patient again over the next 30-60 days.    Kelli Churn RN, CCM, [facility] RN Care Manager [phone]

## 2019-12-11 NOTE — Patient Instructions (Signed)
Visit Information Thanks for the update and I am so glad you were approved for the scooter and PureWick. Please call me when you receive the PureWick and the electric scooter.  Goals Addressed              This Visit's Progress     Patient Stated   .  "Can you call my [facility] navigator?  I don't think he's doing his job." (pt-stated)        CARE PLAN ENTRY (see longitudinal plan of care for additional care plan information)  Current Barriers:  . Care Coordination needs related to securing electric scooter and in network oral surgeons in a patient with COPD, PAF, HF, HTN and morbid obesity.-  returned call to patient, she says she has been approved for the electric scooter and the Pure [place], she says she was told the scooter should be delivered in the next 3-4 weeks and that the [place] is being shipped to her home by [place], she expressed appreciation to this CCM RN for assisting her with the benefits exceptions for the PureWick and with getting the scooter ordered. She says once she has the PureWick she will take her diuretics as prescribed, she denies increased dyspnea, she says the Myrbetriq is helping with her urinary urgency  Nurse Case Manager Clinical Goal(s):  [name] Over the next 30- 90 days, patient will work with [place]  to secure electric scooter and in network oral surgeons and PureWick urinary collection system  Interventions:  . Inter-disciplinary care team collaboration (see longitudinal plan of care) . Again thanked patient for keeping this CCM RN updated on progress of Purewick and Transport planner and requested she notify this CCM RN when she receives each device.     Patient Self Care Activities:  . Patient verbalizes understanding of plan to work with [facility] navigator and CCM RN to address her concerns related to directory of in network oral surgeons, securing an Transport planner and assisting with securing the PureWick for home use . Unable  to independently secure in network providers and DME  Please see past updates related to this goal by clicking on the "Past Updates" button in the selected goal         The patient verbalized understanding of instructions provided today and declined a print copy of patient instruction materials.   The care management team will reach out to the patient again over the next 30-60 days.   Kelli Churn RN, CCM, [facility] RN Care Manager [phone]

## 2019-12-17 ENCOUNTER — Telehealth: Payer: Medicare Other

## 2019-12-17 ENCOUNTER — Telehealth: Payer: Self-pay | Admitting: *Deleted

## 2019-12-17 NOTE — Telephone Encounter (Signed)
  Chronic Care Management   Outreach Note  12/17/2019 Name: Sharon Vasquez [name]: [id number] DOB: 01-Aug-1950  Referred by: Andrew Au, MD Reason for referral : Chronic Care Management (COPD, HF, HTN, PAF, hx of PE, OSA)   An unsuccessful telephone outreach was attempted today. Patient left message for this CCM RN at 9:03 am requesting return call about the PureWick urinary collection system. The patient was referred to the case management team for assistance with care management and care coordination.   Follow Up Plan: A HIPAA compliant phone message was left for the patient providing contact information and requesting a return call.  If patient does not return call, the care management team will reach out to the patient again over the next 30-60 days.   Kelli Churn RN, CCM, [facility] RN Care Manager [phone]

## 2019-12-22 ENCOUNTER — Ambulatory Visit: Payer: Medicare Other | Admitting: *Deleted

## 2019-12-22 DIAGNOSIS — I10 Essential (primary) hypertension: Secondary | ICD-10-CM

## 2019-12-22 DIAGNOSIS — J449 Chronic obstructive pulmonary disease, unspecified: Secondary | ICD-10-CM

## 2019-12-22 DIAGNOSIS — I480 Paroxysmal atrial fibrillation: Secondary | ICD-10-CM

## 2019-12-22 DIAGNOSIS — I5032 Chronic diastolic (congestive) heart failure: Secondary | ICD-10-CM

## 2019-12-22 NOTE — Patient Instructions (Signed)
Visit Information It was nice speaking with you today.  Goals Addressed              This Visit's Progress     Patient Stated   .  "Can you call my [facility] navigator?  I don't think he's doing his job." (pt-stated)        CARE PLAN ENTRY (see longitudinal plan of care for additional care plan information)  Current Barriers:  . Care Coordination needs related to securing electric scooter and in network oral surgeons in a patient with COPD, PAF, HF, HTN and morbid obesity.-  patient left message for this CCM RN requesting that [place] be called to provided requested information  Nurse Case Manager Clinical Goal(s):  [name] Over the next 30- 90 days, patient will work with [place]  to secure electric scooter and in network oral surgeons and PureWick urinary collection system  Interventions:  . Inter-disciplinary care team collaboration (see longitudinal plan of care) . Called [facility] [employer] at [phone] and per request gave verbal order for PureWick urinary collecting system; [facility] will fax order to clinic for provider signature and return to [place] . Notified clinic front desk staff of incoming fax from [place] . Called patient and updated her on action taken with [employer]     Patient Self Care Activities:  . Patient verbalizes understanding of plan to work with [facility] navigator and CCM RN to address her concerns related to directory of in network oral surgeons, securing an Transport planner and assisting with securing the PureWick for home use . Unable to independently secure in network providers and DME  Please see past updates related to this goal by clicking on the "Past Updates" button in the selected goal         The patient verbalized understanding of instructions provided today and declined a print copy of patient instruction materials.   The care management team will reach out to the patient again over the next 30-60 days.    Kelli Churn RN, CCM, [facility] RN Care Manager [phone]

## 2019-12-22 NOTE — Chronic Care Management (AMB) (Signed)
Chronic Care Management   Follow Up Note   12/22/2019 Name: Sharon Vasquez [name]: [id number] DOB: March 04, 1951  Referred by: Andrew Au, MD Reason for referral : Chronic Care Management (COPD, HF, HTN, PAF, hx of PE, OSA)   Sharon Vasquez is a 69 y.o. year old female who is a primary care patient of [name], [name], MD. The CCM team was consulted for assistance with chronic disease management and care coordination needs.    Review of patient status, including review of consultants reports, relevant laboratory and other test results, and collaboration with appropriate care team members and the patient's provider was performed as part of comprehensive patient evaluation and provision of chronic care management services.    SDOH (Social Determinants of Health) assessments performed: No See Care Plan activities for detailed interventions related to [facility])     Outpatient Encounter Medications as of 12/22/2019  Medication Sig Note  . albuterol (PROAIR HFA) 108 (90 Base) MCG/ACT inhaler Inhale 2 puffs into the lungs every 6 (six) hours as needed for wheezing or shortness of breath.   [name] albuterol (PROVENTIL) (2.5 MG/3ML) 0.083% nebulizer solution TAKE 3 MLS(1 AMPULE) BY NEBULIZATION EVERY 4 HOURS AS NEEDED FOR WHHEZING OR SHORTNESS OF BREATH (Patient taking differently: Take 2.5 mg by nebulization every 4 (four) hours as needed for wheezing or shortness of breath. ) 06/24/2019: Use once every two weeks  . aspirin EC 81 MG tablet Take 81 mg by mouth daily.   [name] atorvastatin (LIPITOR) 40 MG tablet Take 1 tablet (40 mg total) by mouth daily.   . benazepril (LOTENSIN) 40 MG tablet Take 1 tablet (40 mg total) by mouth daily.   [name] esomeprazole (NEXIUM) 40 MG capsule Take 1 capsule (40 mg total) by mouth daily as needed (for heartburn or indigestion).   . Fluticasone-Salmeterol (ADVAIR DISKUS) 250-50 MCG/DOSE AEPB Inhale 1 puff into the lungs 2 (two) times daily. Rinse mouth after each use   . gabapentin  (NEURONTIN) 400 MG capsule Take 1 capsule (400 mg total) by mouth 2 (two) times daily.   . hydrALAZINE (APRESOLINE) 25 MG tablet Take 1 tablet (25 mg total) by mouth 3 (three) times daily.   . metoprolol succinate (TOPROL-XL) 100 MG 24 hr tablet TAKE 1 TABLET(100 MG) BY MOUTH DAILY WITH OR IMMEDIATELY FOLLOWING A MEAL   . mirabegron ER (MYRBETRIQ) 50 MG TB24 tablet Take 50 mg by mouth daily. 11/20/2019: Patient states she received 30 day sample from urologist on 11/20/19  . oxyCODONE-acetaminophen (PERCOCET/ROXICET) 5-325 MG tablet Take 1 tablet by mouth daily as needed for severe pain. 06/24/2019: Takes every 6 hours round the clock  . potassium chloride (KLOR-CON) 10 MEQ tablet TAKE 2 TABLETS(20 MEQ) BY MOUTH TWICE DAILY (Patient taking differently: Take 20 mEq by mouth 2 (two) times daily. )   . rivaroxaban (XARELTO) 20 MG TABS tablet Take 1 tablet (20 mg total) by mouth every morning.   . tiotropium (SPIRIVA HANDIHALER) 18 MCG inhalation capsule PLACE 1 CAPSULE INTO INHALER AND INHALE DAILY   . torsemide (DEMADEX) 20 MG tablet Take 2 tablets (40 mg total) by mouth daily.    No facility-administered encounter medications on file as of 12/22/2019.     Objective:  Wt Readings from Last 3 Encounters:  11/10/19 (!) 376 lb 9.6 oz (170.8 kg)  10/24/19 (!) 378 lb 12.8 oz (171.8 kg)  10/23/19 (!) 376 lb 9.6 oz (170.8 kg)    Goals Addressed  This Visit's Progress     Patient Stated   .  "Can you call my [facility] navigator?  I don't think he's doing his job." (pt-stated)        CARE PLAN ENTRY (see longitudinal plan of care for additional care plan information)  Current Barriers:  . Care Coordination needs related to securing electric scooter and in network oral surgeons in a patient with COPD, PAF, HF, HTN and morbid obesity.-  patient left message for this CCM RN requesting that [place] be called to provided requested information  Nurse Case Manager Clinical Goal(s):    [name] Over the next 30- 90 days, patient will work with [place]  to secure electric scooter and in network oral surgeons and PureWick urinary collection system  Interventions:  . Inter-disciplinary care team collaboration (see longitudinal plan of care) . Called [facility] at [phone] and per request gave verbal order for PureWick urinary collecting system; [facility] will fax order to clinic for provider signature and return to [place] . Notified clinic front desk staff of incoming fax from [place] . Called patient and updated her on action taken with [employer]     Patient Self Care Activities:  . Patient verbalizes understanding of plan to work with [facility] navigator and CCM RN to address her concerns related to directory of in network oral surgeons, securing an Transport planner and assisting with securing the PureWick for home use . Unable to independently secure in network providers and DME  Please see past updates related to this goal by clicking on the "Past Updates" button in the selected goal          Plan:   The care management team will reach out to the patient again over the next 30-60 days.    Kelli Churn RN, CCM, [facility] RN Care Manager [phone]

## 2019-12-24 ENCOUNTER — Ambulatory Visit: Payer: Medicare Other | Admitting: *Deleted

## 2019-12-24 ENCOUNTER — Other Ambulatory Visit: Payer: Self-pay

## 2019-12-24 DIAGNOSIS — I739 Peripheral vascular disease, unspecified: Secondary | ICD-10-CM

## 2019-12-24 DIAGNOSIS — G4733 Obstructive sleep apnea (adult) (pediatric): Secondary | ICD-10-CM | POA: Diagnosis not present

## 2019-12-24 DIAGNOSIS — I10 Essential (primary) hypertension: Secondary | ICD-10-CM

## 2019-12-24 DIAGNOSIS — J449 Chronic obstructive pulmonary disease, unspecified: Secondary | ICD-10-CM

## 2019-12-24 DIAGNOSIS — I5032 Chronic diastolic (congestive) heart failure: Secondary | ICD-10-CM

## 2019-12-24 DIAGNOSIS — I480 Paroxysmal atrial fibrillation: Secondary | ICD-10-CM

## 2019-12-24 DIAGNOSIS — G894 Chronic pain syndrome: Secondary | ICD-10-CM

## 2019-12-24 DIAGNOSIS — R32 Unspecified urinary incontinence: Secondary | ICD-10-CM

## 2019-12-24 MED ORDER — BENAZEPRIL HCL 40 MG PO TABS: 40.0000 mg | ORAL_TABLET | Freq: Every day | ORAL | 3 refills | Status: DC

## 2019-12-24 MED ORDER — GABAPENTIN 400 MG PO CAPS: 400.0000 mg | ORAL_CAPSULE | Freq: Two times a day (BID) | ORAL | 0 refills | Status: DC

## 2019-12-24 MED ORDER — ATORVASTATIN CALCIUM 40 MG PO TABS: 40.0000 mg | ORAL_TABLET | Freq: Every day | ORAL | 3 refills | Status: DC

## 2019-12-24 NOTE — Chronic Care Management (AMB) (Signed)
Chronic Care Management   Follow Up Note   12/24/2019 Name: Sharon Vasquez [name]: [id number] DOB: 1950/07/26  Referred by: Andrew Au, MD Reason for referral : Chronic Care Management (COPD, HF, HTN, PAF, hx of PE, OSA)   Sharon Vasquez is a 69 y.o. year old female who is a primary care patient of [name], [name], MD. The CCM team was consulted for assistance with chronic disease management and care coordination needs.    Review of patient status, including review of consultants reports, relevant laboratory and other test results, and collaboration with appropriate care team members and the patient's provider was performed as part of comprehensive patient evaluation and provision of chronic care management services.    SDOH (Social Determinants of Health) assessments performed: No See Care Plan activities for detailed interventions related to [facility])     Outpatient Encounter Medications as of 12/24/2019  Medication Sig Note  . albuterol (PROAIR HFA) 108 (90 Base) MCG/ACT inhaler Inhale 2 puffs into the lungs every 6 (six) hours as needed for wheezing or shortness of breath.   [name] albuterol (PROVENTIL) (2.5 MG/3ML) 0.083% nebulizer solution TAKE 3 MLS(1 AMPULE) BY NEBULIZATION EVERY 4 HOURS AS NEEDED FOR WHHEZING OR SHORTNESS OF BREATH (Patient taking differently: Take 2.5 mg by nebulization every 4 (four) hours as needed for wheezing or shortness of breath. ) 06/24/2019: Use once every two weeks  . aspirin EC 81 MG tablet Take 81 mg by mouth daily.   [name] atorvastatin (LIPITOR) 40 MG tablet Take 1 tablet (40 mg total) by mouth daily.   . benazepril (LOTENSIN) 40 MG tablet Take 1 tablet (40 mg total) by mouth daily.   [name] esomeprazole (NEXIUM) 40 MG capsule Take 1 capsule (40 mg total) by mouth daily as needed (for heartburn or indigestion).   . Fluticasone-Salmeterol (ADVAIR DISKUS) 250-50 MCG/DOSE AEPB Inhale 1 puff into the lungs 2 (two) times daily. Rinse mouth after each use   . gabapentin  (NEURONTIN) 400 MG capsule Take 1 capsule (400 mg total) by mouth 2 (two) times daily.   . hydrALAZINE (APRESOLINE) 25 MG tablet Take 1 tablet (25 mg total) by mouth 3 (three) times daily.   . metoprolol succinate (TOPROL-XL) 100 MG 24 hr tablet TAKE 1 TABLET(100 MG) BY MOUTH DAILY WITH OR IMMEDIATELY FOLLOWING A MEAL   . mirabegron ER (MYRBETRIQ) 50 MG TB24 tablet Take 50 mg by mouth daily. 11/20/2019: Patient states she received 30 day sample from urologist on 11/20/19  . oxyCODONE-acetaminophen (PERCOCET/ROXICET) 5-325 MG tablet Take 1 tablet by mouth daily as needed for severe pain. 06/24/2019: Takes every 6 hours round the clock  . potassium chloride (KLOR-CON) 10 MEQ tablet TAKE 2 TABLETS(20 MEQ) BY MOUTH TWICE DAILY (Patient taking differently: Take 20 mEq by mouth 2 (two) times daily. )   . rivaroxaban (XARELTO) 20 MG TABS tablet Take 1 tablet (20 mg total) by mouth every morning.   . tiotropium (SPIRIVA HANDIHALER) 18 MCG inhalation capsule PLACE 1 CAPSULE INTO INHALER AND INHALE DAILY   . torsemide (DEMADEX) 20 MG tablet Take 2 tablets (40 mg total) by mouth daily.    No facility-administered encounter medications on file as of 12/24/2019.     Objective:  Wt Readings from Last 3 Encounters:  11/10/19 (!) 376 lb 9.6 oz (170.8 kg)  10/24/19 (!) 378 lb 12.8 oz (171.8 kg)  10/23/19 (!) 376 lb 9.6 oz (170.8 kg)   BP Readings from Last 3 Encounters:  11/10/19 114/67  10/24/19  120/82  10/23/19 122/66    Goals Addressed              This Visit's Progress     Patient Stated   .  "Can you call my [facility] navigator?  I don't think he's doing his job." (pt-stated)        CARE PLAN ENTRY (see longitudinal plan of care for additional care plan information)  Current Barriers:  . Care Coordination needs related to securing electric scooter and in network oral surgeons in a patient with COPD, PAF, HF, HTN and morbid obesity.-  patient left message for this CCM RN requesting that [place] be called to provided requested information  Nurse Case Manager Clinical Goal(s):  [name] Over the next 30- 90 days, patient will work with [place] [place]  to secure electric scooter and in network oral surgeons and Pure[employer] urinary collection system  Interventions:  . Inter-disciplinary care team collaboration (see longitudinal plan of care) . Called [employer] at [phone] and per request gave verbal order for PureWick urinary collecting system; [employer] will fax order to clinic for provider signature and return to [place] . Notified clinic front desk staff of incoming fax from [place] . Called patient and updated her on action taken with [employer]  . 12/24/19- received fax of Written Confirmation of Verbal Order from [employer] and placed in [facility] box for signature  Patient Self Care Activities:  . Patient verbalizes understanding of plan to work with [facility] navigator and CCM RN to address her concerns related to directory of in network oral surgeons, securing an Transport planner and assisting with securing the PureWick for home use . Unable to independently secure in network providers and DME  Please see past updates related to this goal by clicking on the "Past Updates" button in the selected goal          Plan:   The care management team will reach out to the patient again over the next 30-60 days.   Kelli Churn RN, CCM, [facility] RN Care Manager [phone]

## 2019-12-24 NOTE — Telephone Encounter (Signed)
Received fax from [facility] requesting RX refills on Atorvastatin Benazepril Gabapentin

## 2019-12-25 ENCOUNTER — Other Ambulatory Visit: Payer: Self-pay

## 2019-12-25 ENCOUNTER — Encounter: Payer: Self-pay | Admitting: Pulmonary Disease

## 2019-12-25 ENCOUNTER — Ambulatory Visit (INDEPENDENT_AMBULATORY_CARE_PROVIDER_SITE_OTHER): Payer: Medicare Other | Admitting: Pulmonary Disease

## 2019-12-25 ENCOUNTER — Ambulatory Visit: Payer: Medicare Other | Admitting: *Deleted

## 2019-12-25 DIAGNOSIS — J9611 Chronic respiratory failure with hypoxia: Secondary | ICD-10-CM | POA: Diagnosis not present

## 2019-12-25 DIAGNOSIS — J449 Chronic obstructive pulmonary disease, unspecified: Secondary | ICD-10-CM

## 2019-12-25 DIAGNOSIS — I5032 Chronic diastolic (congestive) heart failure: Secondary | ICD-10-CM

## 2019-12-25 DIAGNOSIS — I10 Essential (primary) hypertension: Secondary | ICD-10-CM

## 2019-12-25 DIAGNOSIS — J849 Interstitial pulmonary disease, unspecified: Secondary | ICD-10-CM

## 2019-12-25 DIAGNOSIS — G4733 Obstructive sleep apnea (adult) (pediatric): Secondary | ICD-10-CM | POA: Diagnosis not present

## 2019-12-25 DIAGNOSIS — R32 Unspecified urinary incontinence: Secondary | ICD-10-CM

## 2019-12-25 DIAGNOSIS — J439 Emphysema, unspecified: Secondary | ICD-10-CM

## 2019-12-25 DIAGNOSIS — I480 Paroxysmal atrial fibrillation: Secondary | ICD-10-CM

## 2019-12-25 NOTE — Patient Instructions (Signed)
Evaluate for oxygen Try to quit smoking completely ! Try to stay off amiodarone completely CPAP is working well

## 2019-12-25 NOTE — Assessment & Plan Note (Signed)
Weight loss encouraged, compliance with goal of at least 4-6 hrs every night is the expectation. Advised against medications with sedative side effects Cautioned against driving when sleepy - understanding that sleepiness will vary on a day to day basis  

## 2019-12-25 NOTE — Assessment & Plan Note (Signed)
Not convinced that she has significant airway obstruction but she has been maintained on Advair and Spiriva with subjective benefit and will continue.  I think her main issue is related to fluid overload and diastolic heart failure and perhaps pulmonary hypertension and it is important for her to keep up with her diuretics

## 2019-12-25 NOTE — Assessment & Plan Note (Signed)
We reviewed HRCT today.  This seems to be consistent with RB ILD and I again emphasized smoking cessation. Differential diagnosis includes amiodarone which she has stopped 10/2019 but keeps taking this intermittently.  I asked her to completely stop this.  She is worried about her heart rate increasing and I advised her to take metoprolol instead.

## 2019-12-25 NOTE — Chronic Care Management (AMB) (Signed)
Chronic Care Management   Follow Up Note   12/25/2019 Name: Sharon Vasquez [name]: [id number] DOB: [birth year]  Referred by: Andrew Au, MD Reason for referral : Chronic Care Management (COPD, HF, HTN, PAF, hx of PE, OSA)   Sharon Vasquez is a 69 y.o. year old female who is a primary care patient of [name], [name], MD. The CCM team was consulted for assistance with chronic disease management and care coordination needs.    Review of patient status, including review of consultants reports, relevant laboratory and other test results, and collaboration with appropriate care team members and the patient's provider was performed as part of comprehensive patient evaluation and provision of chronic care management services.    SDOH (Social Determinants of Health) assessments performed: No See Care Plan activities for detailed interventions related to [facility])     Outpatient Encounter Medications as of 12/25/2019  Medication Sig Note  . albuterol (PROAIR HFA) 108 (90 Base) MCG/ACT inhaler Inhale 2 puffs into the lungs every 6 (six) hours as needed for wheezing or shortness of breath.   [name] albuterol (PROVENTIL) (2.5 MG/3ML) 0.083% nebulizer solution TAKE 3 MLS(1 AMPULE) BY NEBULIZATION EVERY 4 HOURS AS NEEDED FOR WHHEZING OR SHORTNESS OF BREATH (Patient taking differently: Take 2.5 mg by nebulization every 4 (four) hours as needed for wheezing or shortness of breath. ) 06/24/2019: Use once every two weeks  . aspirin EC 81 MG tablet Take 81 mg by mouth daily.   [name] atorvastatin (LIPITOR) 40 MG tablet Take 1 tablet (40 mg total) by mouth daily.   . benazepril (LOTENSIN) 40 MG tablet Take 1 tablet (40 mg total) by mouth daily.   [name] esomeprazole (NEXIUM) 40 MG capsule Take 1 capsule (40 mg total) by mouth daily as needed (for heartburn or indigestion).   . Fluticasone-Salmeterol (ADVAIR DISKUS) 250-50 MCG/DOSE AEPB Inhale 1 puff into the lungs 2 (two) times daily. Rinse mouth after each use   . gabapentin  (NEURONTIN) 400 MG capsule Take 1 capsule (400 mg total) by mouth 2 (two) times daily.   . hydrALAZINE (APRESOLINE) 25 MG tablet Take 1 tablet (25 mg total) by mouth 3 (three) times daily.   . metoprolol succinate (TOPROL-XL) 100 MG 24 hr tablet TAKE 1 TABLET(100 MG) BY MOUTH DAILY WITH OR IMMEDIATELY FOLLOWING A MEAL   . mirabegron ER (MYRBETRIQ) 50 MG TB24 tablet Take 50 mg by mouth daily. 11/20/2019: Patient states she received 30 day sample from urologist on 11/20/19  . oxyCODONE-acetaminophen (PERCOCET/ROXICET) 5-325 MG tablet Take 1 tablet by mouth daily as needed for severe pain. 06/24/2019: Takes every 6 hours round the clock  . potassium chloride (KLOR-CON) 10 MEQ tablet TAKE 2 TABLETS(20 MEQ) BY MOUTH TWICE DAILY (Patient taking differently: Take 20 mEq by mouth 2 (two) times daily. )   . rivaroxaban (XARELTO) 20 MG TABS tablet Take 1 tablet (20 mg total) by mouth every morning.   . tiotropium (SPIRIVA HANDIHALER) 18 MCG inhalation capsule PLACE 1 CAPSULE INTO INHALER AND INHALE DAILY   . torsemide (DEMADEX) 20 MG tablet Take 2 tablets (40 mg total) by mouth daily.    No facility-administered encounter medications on file as of 12/25/2019.     Objective:   Wt Readings from Last 3 Encounters:  12/25/19 (!) 374 lb 3.2 oz (169.7 kg)  11/10/19 (!) 376 lb 9.6 oz (170.8 kg)  10/24/19 (!) 378 lb 12.8 oz (171.8 kg)   BP Readings from Last 3 Encounters:  12/25/19 112/80  11/10/19 114/67  10/24/19 120/82   Goals Addressed              This Visit's Progress     Patient Stated   .  "Can you call my [facility] navigator?  I don't think he's doing his job." (pt-stated)        CARE PLAN ENTRY (see longitudinal plan of care for additional care plan information)  Current Barriers:  . Care Coordination needs related to securing electric scooter and in network oral surgeons in a patient with COPD, PAF, HF, HTN and morbid obesity.-  patient left message for this CCM RN requesting that [place] be called to provide requested information  Nurse Case Manager Clinical Goal(s):  [name] Over the next 30- 90 days, patient will work with [place]  to secure electric scooter and in network oral surgeons and Pure[employer]ick urinary collection system  Interventions:  . Inter-disciplinary care team collaboration (see longitudinal plan of care) . Called [employer] at [phone] and per request gave verbal order for PureWick urinary collecting system; [employer] will fax order to clinic for provider signature and return to [place] . Notified clinic front desk staff of incoming fax from [place] . Called patient and updated her on action taken with [employer]  . 12/24/19- received fax of Written Confirmation of Verbal Order from [employer] and placed in [facility] box for signature . 12/25/19- Faxed signed order for PureWick and the requested chart documentation to [place] at [phone]  Patient Self Care Activities:  . Patient verbalizes understanding of plan to work with [facility] navigator and CCM RN to address her concerns related to directory of in network oral surgeons, securing an Transport planner and assisting with securing the PureWick for home use . Unable to independently secure in network providers and DME  Please see past updates related to this goal by clicking on the "Past Updates" button in the selected goal        Other   .  "I need a scooter to help me get around"        Current Barriers:  [name] Knowledge Barriers related to resources and support available to address needs related to Inability to perform ADL's independently; need for DME.- patient left message for this CCM RN, on 12/24/19 and says [employer] is faxing a revised order for the electric scooter as a new model number is needed due to patient's weight,  she says the revised order will require a provider signature on the new order with fax back to the scooter  company  Case Manager Clinical Goal(s):  [name] Over the next 30-90  days, patient will work with BSW to address needs related to Inability to perform ADL's independently, need for DME.   . Over the next 30 -90 days, BSW will collaborate with RN Care Manager to address care management and care coordination needs  Interventions:  . Spoke with patient on 9/23 to advise her the clinic has not received a fax from the scooter company as of yet and that the front desk staff was notified to place the fax in this [place] in order to secure the provider's signature and return fax to the seating company  Patient Self Care Activities:  . Attends all scheduled provider appointments . Calls provider office for new concerns or questions  Please see past updates related to this goal by clicking on the "Past Updates" button in the selected goal  Plan:   The care management team will reach out to the patient again over the next 30-60 days.    Kelli Churn RN, CCM, [facility] RN Care Manager [phone]

## 2019-12-25 NOTE — Progress Notes (Signed)
   Subjective:    Patient ID: Sharon Vasquez, female    DOB: 07-Mar-1951, 69 y.o.   [name]: [id number]  HPI  69 yo morbidly obese smoker for follow-up of ILD and OSA She smoked up to 2 packs/day,  now decreased to about 4-5 cigarettes daily  PMH -chronic diastolic heart failure.She has a history of pulmonary embolism/ LLE DVT2015 and has been maintained on Xarelto since then. She underwent venoplasty and stenting of left common iliac and external iliac vein Duplex 05/2017 did not show any residual stenosis. -on Xarelto for chronic atrial fibrillation , amiodarone stopped 10/2019  Chief Complaint  Patient presents with  . Follow-up    Patient is doing good with CPAP. Shortness of breath with exertion. Dry cough    After her last visit we did HRCT and referred her to cardiology to stop amiodarone.  This was stopped but she still takes an occasional tablet when heart rate goes above 100. She is compliant with Xarelto. She continues to smoke, a pack lasts her about 4 days this is much better than before.  She is in a wheelchair and has shortness of breath on ADLs.  She uses her CPAP in the daytime when she cannot catch her breath. Sleep abdominal was reviewed and 13 cm which shows excellent compliance and residual AHI of 7/hour with large leak which is subsided in the last few days when she used a new hose. She is trying to get a clear week from her insurance company since diuretics keep her going to the bathroom  Significant tests/ events reviewed  HRCT 10/2019 >> Mild patchy ground-glass opacity and minimal subpleural reticulation without bronchiectasis or honeycombing. Extensive air trapping >> RBILD, enlarged PA  HRCT 12/2017 >>Mild interlobular septal thickening and mild patchy peribronchovascular ground-glass opacity in both lungs. Minimal patchy subpleural reticulation in the upper lobes? Pul edema vs RB-ILD  PFTs9/2019- No obstruction, mild-mod restriction, decreased DLCO  FVC  2.16 (66%), FEV1 1.93 (75%), ratio 89, DLCOcor 16.45 (48%)  FENO 12/2017 5  ECHO 07/2016-EF 60-65%, PA peak pressure 15mm Hg  HST 12/2017 >> AHI 20/h    Review of Systems neg for any significant sore throat, dysphagia, itching, sneezing, nasal congestion or excess/ purulent secretions, fever, chills, sweats, unintended wt loss, pleuritic or exertional cp, hempoptysis, orthopnea pnd or change in chronic leg swelling. Also denies presyncope, palpitations, heartburn, abdominal pain, nausea, vomiting, diarrhea or change in bowel or urinary habits, dysuria,hematuria, rash, arthralgias, visual complaints, headache, numbness weakness or ataxia.     Objective:   Physical Exam   Gen. Pleasant, obese, in no distress ENT - no lesions, no post nasal drip Neck: No JVD, no thyromegaly, no carotid bruits Lungs: no use of accessory muscles, no dullness to percussion, decreased without rales or rhonchi  Cardiovascular: Rhythm regular, heart sounds  normal, no murmurs or gallops, 2+ peripheral edema Musculoskeletal: No deformities, no cyanosis or clubbing , no tremors      Assessment & Plan:

## 2019-12-25 NOTE — Assessment & Plan Note (Signed)
She did not want to wait for ambulatory assessment of oxygen since her ride was here.  We will ask DME to assess her at home and provide her oxygen if needed

## 2019-12-26 ENCOUNTER — Ambulatory Visit: Payer: Medicare Other | Admitting: *Deleted

## 2019-12-26 DIAGNOSIS — I5032 Chronic diastolic (congestive) heart failure: Secondary | ICD-10-CM

## 2019-12-26 DIAGNOSIS — J449 Chronic obstructive pulmonary disease, unspecified: Secondary | ICD-10-CM

## 2019-12-26 DIAGNOSIS — I480 Paroxysmal atrial fibrillation: Secondary | ICD-10-CM

## 2019-12-26 DIAGNOSIS — I10 Essential (primary) hypertension: Secondary | ICD-10-CM

## 2019-12-26 DIAGNOSIS — R32 Unspecified urinary incontinence: Secondary | ICD-10-CM

## 2019-12-26 NOTE — Chronic Care Management (AMB) (Signed)
Chronic Care Management   Follow Up Note   12/26/2019 Name: Sharon Vasquez [name]: [id number] DOB: [birth year]  Referred by: Andrew Au, MD Reason for referral : Chronic Care Management (COPD, HF, HTN, PAF, hx of PE, OSA)   Sharon Vasquez is a 69 y.o. year old female who is a primary care patient of Sharon Vasquez, Sharon Reading, MD. The CCM team was consulted for assistance with chronic disease management and care coordination needs.    Review of patient status, including review of consultants reports, relevant laboratory and other test results, and collaboration with appropriate care team members and the patient's provider was performed as part of comprehensive patient evaluation and provision of chronic care management services.    SDOH (Social Determinants of Health) assessments performed: No See Care Plan activities for detailed interventions related to [facility])     Outpatient Encounter Medications as of 12/26/2019  Medication Sig Note  . albuterol (PROAIR HFA) 108 (90 Base) MCG/ACT inhaler Inhale 2 puffs into the lungs every 6 (six) hours as needed for wheezing or shortness of breath.   [name] albuterol (PROVENTIL) (2.5 MG/3ML) 0.083% nebulizer solution TAKE 3 MLS(1 AMPULE) BY NEBULIZATION EVERY 4 HOURS AS NEEDED FOR WHHEZING OR SHORTNESS OF BREATH (Patient taking differently: Take 2.5 mg by nebulization every 4 (four) hours as needed for wheezing or shortness of breath. ) 06/24/2019: Use once every two weeks  . aspirin EC 81 MG tablet Take 81 mg by mouth daily.   [name] atorvastatin (LIPITOR) 40 MG tablet Take 1 tablet (40 mg total) by mouth daily.   . benazepril (LOTENSIN) 40 MG tablet Take 1 tablet (40 mg total) by mouth daily.   [name] esomeprazole (NEXIUM) 40 MG capsule Take 1 capsule (40 mg total) by mouth daily as needed (for heartburn or indigestion).   . Fluticasone-Salmeterol (ADVAIR DISKUS) 250-50 MCG/DOSE AEPB Inhale 1 puff into the lungs 2 (two) times daily. Rinse mouth after each use   . gabapentin  (NEURONTIN) 400 MG capsule Take 1 capsule (400 mg total) by mouth 2 (two) times daily.   . hydrALAZINE (APRESOLINE) 25 MG tablet Take 1 tablet (25 mg total) by mouth 3 (three) times daily.   . metoprolol succinate (TOPROL-XL) 100 MG 24 hr tablet TAKE 1 TABLET(100 MG) BY MOUTH DAILY WITH OR IMMEDIATELY FOLLOWING A MEAL   . mirabegron ER (MYRBETRIQ) 50 MG TB24 tablet Take 50 mg by mouth daily. 11/20/2019: Patient states she received 30 day sample from urologist on 11/20/19  . oxyCODONE-acetaminophen (PERCOCET/ROXICET) 5-325 MG tablet Take 1 tablet by mouth daily as needed for severe pain. 06/24/2019: Takes every 6 hours round the clock  . potassium chloride (KLOR-CON) 10 MEQ tablet TAKE 2 TABLETS(20 MEQ) BY MOUTH TWICE DAILY (Patient taking differently: Take 20 mEq by mouth 2 (two) times daily. )   . rivaroxaban (XARELTO) 20 MG TABS tablet Take 1 tablet (20 mg total) by mouth every morning.   . tiotropium (SPIRIVA HANDIHALER) 18 MCG inhalation capsule PLACE 1 CAPSULE INTO INHALER AND INHALE DAILY   . torsemide (DEMADEX) 20 MG tablet Take 2 tablets (40 mg total) by mouth daily.    No facility-administered encounter medications on file as of 12/26/2019.     Objective:  Wt Readings from Last 3 Encounters:  12/25/19 (!) 374 lb 3.2 oz (169.7 kg)  11/10/19 (!) 376 lb 9.6 oz (170.8 kg)  10/24/19 (!) 378 lb 12.8 oz (171.8 kg)   BP Readings from Last 3 Encounters:  12/25/19 112/80  11/10/19  114/67  10/24/19 120/82   Lab Results  Component Value Date   CHOL 154 07/19/2017   HDL 48 07/19/2017   LDLCALC 86 07/19/2017   TRIG 99 07/19/2017   CHOLHDL 3.2 07/19/2017    Goals Addressed            This Visit's Progress   . "I need a scooter to help me get around"       Current Barriers:  [name] Knowledge Barriers related to resources and support available to address needs related to Inability to perform ADL's independently; need for DME.- patient called this CCM RN and provided update that  [employer] will not be is faxing a revised order for the electric scooter as a new model number is needed due to patient's weight, to the clinic for now   Case Manager Clinical Goal(s):  [name] Over the next 30-90  days, patient will work with BSW to address needs related to Inability to perform ADL's independently, need for DME.   . Over the next 30 -90 days, BSW will collaborate with RN Care Manager to address care management and care coordination needs  Interventions:  . Thanked patient for the update on the scooter order and offered to help secure provider signature if needed in the future   Patient Self Care Activities:  . Attends all scheduled provider appointments . Calls provider office for new concerns or questions  Please see past updates related to this goal by clicking on the "Past Updates" button in the selected goal          Plan:   The care management team will reach out to the patient again over the next 30-60 days.    Kelli Churn RN, CCM, [facility] RN Care Manager [phone]

## 2019-12-29 ENCOUNTER — Ambulatory Visit: Payer: Medicare Other | Admitting: *Deleted

## 2019-12-29 DIAGNOSIS — J449 Chronic obstructive pulmonary disease, unspecified: Secondary | ICD-10-CM

## 2019-12-29 DIAGNOSIS — I10 Essential (primary) hypertension: Secondary | ICD-10-CM

## 2019-12-29 DIAGNOSIS — I480 Paroxysmal atrial fibrillation: Secondary | ICD-10-CM

## 2019-12-29 DIAGNOSIS — R32 Unspecified urinary incontinence: Secondary | ICD-10-CM

## 2019-12-29 DIAGNOSIS — I5032 Chronic diastolic (congestive) heart failure: Secondary | ICD-10-CM

## 2019-12-29 NOTE — Progress Notes (Addendum)
Internal Medicine Clinic Resident  I have personally reviewed this encounter including the documentation in this note and/or discussed this patient with the care management provider. I will address any urgent items identified by the care management provider and will communicate my actions to the patient's PCP. I have reviewed the patient's CCM visit with my supervising attending, Dr Jimmye Norman.  Jean Rosenthal, MD 12/29/2019  I reviewed case and documentation and agree.

## 2019-12-29 NOTE — Chronic Care Management (AMB) (Signed)
Chronic Care Management   Follow Up Note   12/29/2019 Name: Sharon Vasquez [name]: [id number] DOB: Feb 17, 1951  Referred by: Andrew Au, MD Reason for referral : Chronic Care Management (COPD, HF, HTN, PAF, hx of PE, OSA)   Sharon Vasquez is a 69 y.o. year old female who is a primary care patient of [name], [name], MD. The CCM team was consulted for assistance with chronic disease management and care coordination needs.    Review of patient status, including review of consultants reports, relevant laboratory and other test results, and collaboration with appropriate care team members and the patient's provider was performed as part of comprehensive patient evaluation and provision of chronic care management services.    SDOH (Social Determinants of Health) assessments performed: No See Care Plan activities for detailed interventions related to [facility])     Outpatient Encounter Medications as of 12/29/2019  Medication Sig Note  . albuterol (PROAIR HFA) 108 (90 Base) MCG/ACT inhaler Inhale 2 puffs into the lungs every 6 (six) hours as needed for wheezing or shortness of breath.   [name] albuterol (PROVENTIL) (2.5 MG/3ML) 0.083% nebulizer solution TAKE 3 MLS(1 AMPULE) BY NEBULIZATION EVERY 4 HOURS AS NEEDED FOR WHHEZING OR SHORTNESS OF BREATH (Patient taking differently: Take 2.5 mg by nebulization every 4 (four) hours as needed for wheezing or shortness of breath. ) 06/24/2019: Use once every two weeks  . aspirin EC 81 MG tablet Take 81 mg by mouth daily.   [name] atorvastatin (LIPITOR) 40 MG tablet Take 1 tablet (40 mg total) by mouth daily.   . benazepril (LOTENSIN) 40 MG tablet Take 1 tablet (40 mg total) by mouth daily.   [name] esomeprazole (NEXIUM) 40 MG capsule Take 1 capsule (40 mg total) by mouth daily as needed (for heartburn or indigestion).   . Fluticasone-Salmeterol (ADVAIR DISKUS) 250-50 MCG/DOSE AEPB Inhale 1 puff into the lungs 2 (two) times daily. Rinse mouth after each use   . gabapentin  (NEURONTIN) 400 MG capsule Take 1 capsule (400 mg total) by mouth 2 (two) times daily.   . hydrALAZINE (APRESOLINE) 25 MG tablet Take 1 tablet (25 mg total) by mouth 3 (three) times daily.   . metoprolol succinate (TOPROL-XL) 100 MG 24 hr tablet TAKE 1 TABLET(100 MG) BY MOUTH DAILY WITH OR IMMEDIATELY FOLLOWING A MEAL   . mirabegron ER (MYRBETRIQ) 50 MG TB24 tablet Take 50 mg by mouth daily. 11/20/2019: Patient states she received 30 day sample from urologist on 11/20/19  . oxyCODONE-acetaminophen (PERCOCET/ROXICET) 5-325 MG tablet Take 1 tablet by mouth daily as needed for severe pain. 06/24/2019: Takes every 6 hours round the clock  . potassium chloride (KLOR-CON) 10 MEQ tablet TAKE 2 TABLETS(20 MEQ) BY MOUTH TWICE DAILY (Patient taking differently: Take 20 mEq by mouth 2 (two) times daily. )   . rivaroxaban (XARELTO) 20 MG TABS tablet Take 1 tablet (20 mg total) by mouth every morning.   . tiotropium (SPIRIVA HANDIHALER) 18 MCG inhalation capsule PLACE 1 CAPSULE INTO INHALER AND INHALE DAILY   . torsemide (DEMADEX) 20 MG tablet Take 2 tablets (40 mg total) by mouth daily.    No facility-administered encounter medications on file as of 12/29/2019.     Objective:  Wt Readings from Last 3 Encounters:  12/25/19 (!) 374 lb 3.2 oz (169.7 kg)  11/10/19 (!) 376 lb 9.6 oz (170.8 kg)  10/24/19 (!) 378 lb 12.8 oz (171.8 kg)   Lab Results  Component Value Date   CHOL 154 07/19/2017  HDL 48 07/19/2017   LDLCALC 86 07/19/2017   TRIG 99 07/19/2017   CHOLHDL 3.2 07/19/2017    Goals Addressed              This Visit's Progress     Patient Stated   .  "Can you call my [facility] navigator?  I don't think he's doing his job." (pt-stated)        CARE PLAN ENTRY (see longitudinal plan of care for additional care plan information)  Current Barriers:  . Care Coordination needs related to securing electric scooter and in network oral surgeons in a patient with COPD, PAF, HF, HTN and morbid obesity.-   patient left message for this CCM RN stating [place] sent a fax to the clinic requesting additional documentation to support need for [facility]  Nurse Case Manager Clinical Goal(s):  [name] Over the next 30- 90 days, patient will work with [place]  to secure electric scooter and in network oral surgeons and PureWick urinary collection system  Interventions:  . Inter-disciplinary care team collaboration (see longitudinal plan of care) . Retrieved fax from this CCM RN's clinic mailbox and then messaged clinic provider about the need for additional documentation to support patient's need for PureWick  Patient Self Care Activities:  . Patient verbalizes understanding of plan to work with [facility] navigator and CCM RN to address her concerns related to directory of in network oral surgeons, securing an Transport planner and assisting with securing the PureWick for home use . Unable to independently secure in network providers and DME  Please see past updates related to this goal by clicking on the "Past Updates" button in the selected goal          Plan:   The care management team will reach out to the patient again over the next 30-60 days.    Kelli Churn RN, CCM, [facility] RN Care Manager [phone]

## 2019-12-31 NOTE — Addendum Note (Signed)
Addended by: Hulan Fray on: 12/31/2019 07:40 PM   Modules accepted: Orders

## 2020-01-01 DIAGNOSIS — Z8679 Personal history of other diseases of the circulatory system: Secondary | ICD-10-CM | POA: Diagnosis not present

## 2020-01-01 DIAGNOSIS — R1312 Dysphagia, oropharyngeal phase: Secondary | ICD-10-CM | POA: Diagnosis not present

## 2020-01-01 DIAGNOSIS — R1319 Other dysphagia: Secondary | ICD-10-CM | POA: Diagnosis not present

## 2020-01-01 DIAGNOSIS — J849 Interstitial pulmonary disease, unspecified: Secondary | ICD-10-CM | POA: Diagnosis not present

## 2020-01-02 DIAGNOSIS — Z79899 Other long term (current) drug therapy: Secondary | ICD-10-CM | POA: Diagnosis not present

## 2020-01-02 DIAGNOSIS — M25561 Pain in right knee: Secondary | ICD-10-CM | POA: Diagnosis not present

## 2020-01-02 DIAGNOSIS — G8929 Other chronic pain: Secondary | ICD-10-CM | POA: Diagnosis not present

## 2020-01-02 DIAGNOSIS — M170 Bilateral primary osteoarthritis of knee: Secondary | ICD-10-CM | POA: Diagnosis not present

## 2020-01-02 DIAGNOSIS — M25562 Pain in left knee: Secondary | ICD-10-CM | POA: Diagnosis not present

## 2020-01-06 ENCOUNTER — Ambulatory Visit: Payer: Medicare Other | Admitting: *Deleted

## 2020-01-06 DIAGNOSIS — J449 Chronic obstructive pulmonary disease, unspecified: Secondary | ICD-10-CM

## 2020-01-06 DIAGNOSIS — I5032 Chronic diastolic (congestive) heart failure: Secondary | ICD-10-CM

## 2020-01-06 DIAGNOSIS — I10 Essential (primary) hypertension: Secondary | ICD-10-CM

## 2020-01-06 DIAGNOSIS — R32 Unspecified urinary incontinence: Secondary | ICD-10-CM

## 2020-01-06 DIAGNOSIS — I480 Paroxysmal atrial fibrillation: Secondary | ICD-10-CM

## 2020-01-06 NOTE — Chronic Care Management (AMB) (Signed)
  Chronic Care Management   Note  01/06/2020 Name: Sharon Vasquez [name]: [id number] DOB: 05-Jan-1951  Returned call to patient after she left voice mail for this CCM RN at 11:46 am today requesting a return call verifying that fax was received from [employer].  Left message on patient's mobile voice mailbox advising her that the fax has not been received either at the [place] or at the [place] [facility] office.   Follow up plan: Telephone follow up appointment with care management team member scheduled for:01/08/20  Kelli Churn RN, CCM, [facility] RN Care Manager [phone]

## 2020-01-07 ENCOUNTER — Ambulatory Visit: Payer: Medicare Other | Admitting: *Deleted

## 2020-01-07 DIAGNOSIS — I5032 Chronic diastolic (congestive) heart failure: Secondary | ICD-10-CM

## 2020-01-07 DIAGNOSIS — J449 Chronic obstructive pulmonary disease, unspecified: Secondary | ICD-10-CM

## 2020-01-07 DIAGNOSIS — I480 Paroxysmal atrial fibrillation: Secondary | ICD-10-CM

## 2020-01-07 DIAGNOSIS — I10 Essential (primary) hypertension: Secondary | ICD-10-CM

## 2020-01-07 NOTE — Chronic Care Management (AMB) (Signed)
  Chronic Care Management   Note  01/07/2020 Name: Sharon Vasquez [name]: [id number] DOB: July 04, 1950  Returned call to patient after she left message for this CCM RN in reference to fax from [employer].  Spoke with patient and advised her the Internal Medicine Clinic has not received a fax from [employer].  Follow up plan: The care management team will reach out to the patient again over the next 30-60 days.   Kelli Churn RN, CCM, [facility] RN Care Manager [phone]

## 2020-01-08 ENCOUNTER — Telehealth: Payer: Medicare Other

## 2020-01-10 ENCOUNTER — Encounter: Payer: Self-pay | Admitting: Student

## 2020-01-12 ENCOUNTER — Ambulatory Visit: Payer: Medicare Other | Admitting: *Deleted

## 2020-01-12 DIAGNOSIS — I5032 Chronic diastolic (congestive) heart failure: Secondary | ICD-10-CM

## 2020-01-12 DIAGNOSIS — I10 Essential (primary) hypertension: Secondary | ICD-10-CM

## 2020-01-12 DIAGNOSIS — J449 Chronic obstructive pulmonary disease, unspecified: Secondary | ICD-10-CM

## 2020-01-12 DIAGNOSIS — I480 Paroxysmal atrial fibrillation: Secondary | ICD-10-CM

## 2020-01-12 NOTE — Patient Instructions (Signed)
Visit Information It was nice speaking with you today. Goals Addressed              This Visit's Progress     Patient Stated   .  "Can you call my [facility] navigator?  I don't think he's doing his job." (pt-stated)        CARE PLAN ENTRY (see longitudinal plan of care for additional care plan information)  Current Barriers:  . Care Coordination needs related to securing electric scooter and in network oral surgeons in a patient with COPD, PAF, HF, HTN and morbid obesity.-  patient called  this CCM RN and requested th CCM RN contact the prescription department of [place] to assist with securing PureWick urinary collection system  Nurse Case Manager Clinical Goal(s):  [name] Over the next 30- 90 days, patient will work with [place]  to secure electric scooter and in network oral surgeons and PureWick urinary collection system  Interventions:  . Inter-disciplinary care team collaboration (see longitudinal plan of care) . Retrieved revised letter of medical necessity from Dr [name] and faxed the letter to [facility] to the attention of Donavan Foil, fax# [phone].  [name] Per patient direction called [phone], the Rx department for [facility] , spoke with Caryl Pina and she verified the fax was received with the additional information dictated by Dr [name]. Caryl Pina says she will contact the clinic if additional information is needed.   Patient Self Care Activities:  . Patient verbalizes understanding of plan to work with [facility] navigator and CCM RN to address her concerns related to directory of in network oral surgeons, securing an Transport planner and assisting with securing the PureWick for home use . Unable to independently secure in network providers and DME  Please see past updates related to this goal by clicking on the "Past Updates" button in the selected goal         The patient verbalized understanding of instructions provided today and  declined a print copy of patient instruction materials.   The care management team will reach out to the patient again over the next 30 days.   Kelli Churn RN, CCM, [facility] RN Care Manager [phone]

## 2020-01-12 NOTE — Chronic Care Management (AMB) (Signed)
Chronic Care Management   Follow Up Note   01/12/2020 Name: Sharon Vasquez [name]: [id number] DOB: December 01, 1950  Referred by: Andrew Au, MD Reason for referral : Chronic Care Management (COPD, HF, HTN, PAF, hx of PE, OSA)   Sharon Vasquez is a 69 y.o. year old female who is a primary care patient of [name], [name], MD. The CCM team was consulted for assistance with chronic disease management and care coordination needs.    Review of patient status, including review of consultants reports, relevant laboratory and other test results, and collaboration with appropriate care team members and the patient's provider was performed as part of comprehensive patient evaluation and provision of chronic care management services.    SDOH (Social Determinants of Health) assessments performed: No See Care Plan activities for detailed interventions related to [facility])     Outpatient Encounter Medications as of 01/12/2020  Medication Sig Note  . albuterol (PROAIR HFA) 108 (90 Base) MCG/ACT inhaler Inhale 2 puffs into the lungs every 6 (six) hours as needed for wheezing or shortness of breath.   [name] albuterol (PROVENTIL) (2.5 MG/3ML) 0.083% nebulizer solution TAKE 3 MLS(1 AMPULE) BY NEBULIZATION EVERY 4 HOURS AS NEEDED FOR WHHEZING OR SHORTNESS OF BREATH (Patient taking differently: Take 2.5 mg by nebulization every 4 (four) hours as needed for wheezing or shortness of breath. ) 06/24/2019: Use once every two weeks  . aspirin EC 81 MG tablet Take 81 mg by mouth daily.   [name] atorvastatin (LIPITOR) 40 MG tablet Take 1 tablet (40 mg total) by mouth daily.   . benazepril (LOTENSIN) 40 MG tablet Take 1 tablet (40 mg total) by mouth daily.   [name] esomeprazole (NEXIUM) 40 MG capsule Take 1 capsule (40 mg total) by mouth daily as needed (for heartburn or indigestion).   . Fluticasone-Salmeterol (ADVAIR DISKUS) 250-50 MCG/DOSE AEPB Inhale 1 puff into the lungs 2 (two) times daily. Rinse mouth after each use   . gabapentin  (NEURONTIN) 400 MG capsule Take 1 capsule (400 mg total) by mouth 2 (two) times daily.   . hydrALAZINE (APRESOLINE) 25 MG tablet Take 1 tablet (25 mg total) by mouth 3 (three) times daily.   . metoprolol succinate (TOPROL-XL) 100 MG 24 hr tablet TAKE 1 TABLET(100 MG) BY MOUTH DAILY WITH OR IMMEDIATELY FOLLOWING A MEAL   . mirabegron ER (MYRBETRIQ) 50 MG TB24 tablet Take 50 mg by mouth daily. 11/20/2019: Patient states she received 30 day sample from urologist on 11/20/19  . oxyCODONE-acetaminophen (PERCOCET/ROXICET) 5-325 MG tablet Take 1 tablet by mouth daily as needed for severe pain. 06/24/2019: Takes every 6 hours round the clock  . potassium chloride (KLOR-CON) 10 MEQ tablet TAKE 2 TABLETS(20 MEQ) BY MOUTH TWICE DAILY (Patient taking differently: Take 20 mEq by mouth 2 (two) times daily. )   . rivaroxaban (XARELTO) 20 MG TABS tablet Take 1 tablet (20 mg total) by mouth every morning.   . tiotropium (SPIRIVA HANDIHALER) 18 MCG inhalation capsule PLACE 1 CAPSULE INTO INHALER AND INHALE DAILY   . torsemide (DEMADEX) 20 MG tablet Take 2 tablets (40 mg total) by mouth daily.    No facility-administered encounter medications on file as of 01/12/2020.     Objective:   Wt Readings from Last 3 Encounters:  12/25/19 (!) 374 lb 3.2 oz (169.7 kg)  11/10/19 (!) 376 lb 9.6 oz (170.8 kg)  10/24/19 (!) 378 lb 12.8 oz (171.8 kg)   BP Readings from Last 3 Encounters:  12/25/19 112/80  11/10/19 114/67  10/24/19 120/82    Goals Addressed              This Visit's Progress     Patient Stated   .  "Can you call my [facility] navigator?  I don't think he's doing his job." (pt-stated)        CARE PLAN ENTRY (see longitudinal plan of care for additional care plan information)  Current Barriers:  . Care Coordination needs related to securing electric scooter and in network oral surgeons in a patient with COPD, PAF, HF, HTN and morbid obesity.-  patient called  this CCM RN and requested th CCM RN contact  the prescription department of [place] to assist with securing PureWick urinary collection system  Nurse Case Manager Clinical Goal(s):  [name] Over the next 30- 90 days, patient will work with [place]  to secure electric scooter and in network oral surgeons and PureWick urinary collection system  Interventions:  . Inter-disciplinary care team collaboration (see longitudinal plan of care) . Retrieved revised letter of medical necessity from Dr [name] and faxed the letter to [facility] to the attention of Donavan Foil, fax# [phone].  [name] Per patient direction called [phone], the Rx department for [facility] , spoke with Caryl Pina and she verified the fax was received with the additional information dictated by Dr [name]. Caryl Pina says she will contact the clinic if additional information is needed.   Patient Self Care Activities:  . Patient verbalizes understanding of plan to work with [facility] navigator and CCM RN to address her concerns related to directory of in network oral surgeons, securing an Transport planner and assisting with securing the PureWick for home use . Unable to independently secure in network providers and DME  Please see past updates related to this goal by clicking on the "Past Updates" button in the selected goal          Plan:   The care management team will reach out to the patient again over the next 30 days.    SIGNATURE

## 2020-01-13 ENCOUNTER — Ambulatory Visit: Payer: Medicare Other | Admitting: *Deleted

## 2020-01-13 DIAGNOSIS — J449 Chronic obstructive pulmonary disease, unspecified: Secondary | ICD-10-CM

## 2020-01-13 DIAGNOSIS — I480 Paroxysmal atrial fibrillation: Secondary | ICD-10-CM

## 2020-01-13 DIAGNOSIS — I10 Essential (primary) hypertension: Secondary | ICD-10-CM

## 2020-01-13 DIAGNOSIS — I5032 Chronic diastolic (congestive) heart failure: Secondary | ICD-10-CM

## 2020-01-13 NOTE — Progress Notes (Signed)
Internal Medicine Clinic Resident  I have personally reviewed this encounter including the documentation in this note and/or discussed this patient with the care management provider. I will address any urgent items identified by the care management provider and will communicate my actions to the patient's PCP. I have reviewed the patient's CCM visit with my supervising attending, Dr Rebeca Alert.  Delice Bison, DO 01/13/2020

## 2020-01-13 NOTE — Progress Notes (Signed)
Internal Medicine Clinic Resident  I have personally reviewed this encounter including the documentation in this note and/or discussed this patient with the care management provider. I will address any urgent items identified by the care management provider and will communicate my actions to the patient's PCP. I have reviewed the patient's CCM visit with my supervising attending, Dr Raines.  Shantelle Alles, MD 01/13/2020    

## 2020-01-13 NOTE — Chronic Care Management (AMB) (Signed)
Chronic Care Management   Follow Up Note   01/13/2020 Name: Sharon Vasquez [name]: [id number] DOB: 07-07-1950  Referred by: Sharon Au, MD Reason for referral : Chronic Care Management (COPD, HF, HTN, PAF, hx of PE, OSA)   Sharon Vasquez is a 69 y.o. year old female who is a primary care patient of Sharon Vasquez, Sharon Reading, MD. The CCM team was consulted for assistance with chronic disease management and care coordination needs.    Review of patient status, including review of consultants reports, relevant laboratory and other test results, and collaboration with appropriate care team members and the patient's provider was performed as part of comprehensive patient evaluation and provision of chronic care management services.    SDOH (Social Determinants of Health) assessments performed: No See Care Plan activities for detailed interventions related to [facility])     Outpatient Encounter Medications as of 01/13/2020  Medication Sig Note  . albuterol (PROAIR HFA) 108 (90 Base) MCG/ACT inhaler Inhale 2 puffs into the lungs every 6 (six) hours as needed for wheezing or shortness of breath.   [name] albuterol (PROVENTIL) (2.5 MG/3ML) 0.083% nebulizer solution TAKE 3 MLS(1 AMPULE) BY NEBULIZATION EVERY 4 HOURS AS NEEDED FOR WHHEZING OR SHORTNESS OF BREATH (Patient taking differently: Take 2.5 mg by nebulization every 4 (four) hours as needed for wheezing or shortness of breath. ) 06/24/2019: Use once every two weeks  . aspirin EC 81 MG tablet Take 81 mg by mouth daily.   [name] atorvastatin (LIPITOR) 40 MG tablet Take 1 tablet (40 mg total) by mouth daily.   . benazepril (LOTENSIN) 40 MG tablet Take 1 tablet (40 mg total) by mouth daily.   [name] esomeprazole (NEXIUM) 40 MG capsule Take 1 capsule (40 mg total) by mouth daily as needed (for heartburn or indigestion).   . Fluticasone-Salmeterol (ADVAIR DISKUS) 250-50 MCG/DOSE AEPB Inhale 1 puff into the lungs 2 (two) times daily. Rinse mouth after each use   . gabapentin  (NEURONTIN) 400 MG capsule Take 1 capsule (400 mg total) by mouth 2 (two) times daily.   . hydrALAZINE (APRESOLINE) 25 MG tablet Take 1 tablet (25 mg total) by mouth 3 (three) times daily.   . metoprolol succinate (TOPROL-XL) 100 MG 24 hr tablet TAKE 1 TABLET(100 MG) BY MOUTH DAILY WITH OR IMMEDIATELY FOLLOWING A MEAL   . mirabegron ER (MYRBETRIQ) 50 MG TB24 tablet Take 50 mg by mouth daily. 11/20/2019: Patient states she received 30 day sample from urologist on 11/20/19  . oxyCODONE-acetaminophen (PERCOCET/ROXICET) 5-325 MG tablet Take 1 tablet by mouth daily as needed for severe pain. 06/24/2019: Takes every 6 hours round the clock  . potassium chloride (KLOR-CON) 10 MEQ tablet TAKE 2 TABLETS(20 MEQ) BY MOUTH TWICE DAILY (Patient taking differently: Take 20 mEq by mouth 2 (two) times daily. )   . rivaroxaban (XARELTO) 20 MG TABS tablet Take 1 tablet (20 mg total) by mouth every morning.   . tiotropium (SPIRIVA HANDIHALER) 18 MCG inhalation capsule PLACE 1 CAPSULE INTO INHALER AND INHALE DAILY   . torsemide (DEMADEX) 20 MG tablet Take 2 tablets (40 mg total) by mouth daily.    No facility-administered encounter medications on file as of 01/13/2020.     Objective:  Wt Readings from Last 3 Encounters:  12/25/19 (!) 374 lb 3.2 oz (169.7 kg)  11/10/19 (!) 376 lb 9.6 oz (170.8 kg)  10/24/19 (!) 378 lb 12.8 oz (171.8 kg)   BP Readings from Last 3 Encounters:  12/25/19 112/80  11/10/19  114/67  10/24/19 120/82    Goals Addressed              This Visit's Progress     Patient Stated   .  "Can you call my [facility] navigator?  I don't think he's doing his job." (pt-stated)        CARE PLAN ENTRY (see longitudinal plan of care for additional care plan information)  Current Barriers:  . Care Coordination needs related to securing electric scooter and in network oral surgeons in a patient with COPD, PAF, HF, HTN and morbid obesity.- received call from [place] [facility] [place]- she states the letter  of medical necessity needs to include the frequency of catheter change and be signed and dated  Nurse Case Manager Clinical Goal(s):  [name] Over the next 30- 90 days, patient will work with [place]  to secure electric scooter and in network oral surgeons and PureWick urinary collection system  Interventions:  . Inter-disciplinary care team collaboration (see longitudinal plan of care) . Messaged Dr. Bridgett Vasquez regarding  the revisions needed for the letter of medical necessity for prior approval of the PureWick  Patient Self Care Activities:  . Patient verbalizes understanding of plan to work with [facility] navigator and CCM RN to address her concerns related to directory of in network oral surgeons, securing an Transport planner and assisting with securing the PureWick for home use . Unable to independently secure in network providers and DME  Please see past updates related to this goal by clicking on the "Past Updates" button in the selected goal          Plan:   The care management team will reach out to the patient again over the next 30-60 days.    Kelli Churn RN, CCM, [facility] RN Care Manager [phone]

## 2020-01-14 ENCOUNTER — Other Ambulatory Visit: Payer: Self-pay | Admitting: Gastroenterology

## 2020-01-14 ENCOUNTER — Ambulatory Visit: Payer: Medicare Other | Admitting: *Deleted

## 2020-01-14 DIAGNOSIS — I10 Essential (primary) hypertension: Secondary | ICD-10-CM

## 2020-01-14 DIAGNOSIS — R1319 Other dysphagia: Secondary | ICD-10-CM

## 2020-01-14 DIAGNOSIS — J449 Chronic obstructive pulmonary disease, unspecified: Secondary | ICD-10-CM

## 2020-01-14 DIAGNOSIS — R32 Unspecified urinary incontinence: Secondary | ICD-10-CM

## 2020-01-14 DIAGNOSIS — I480 Paroxysmal atrial fibrillation: Secondary | ICD-10-CM

## 2020-01-14 DIAGNOSIS — I5032 Chronic diastolic (congestive) heart failure: Secondary | ICD-10-CM

## 2020-01-14 NOTE — Progress Notes (Addendum)
Internal Medicine Clinic Resident  I have personally reviewed this encounter including the documentation in this note and/or discussed this patient with the care management provider. I will address any urgent items identified by the care management provider and will communicate my actions to the patient's PCP. I have reviewed the patient's CCM visit with my supervising attending, Dr Williams.  Fatema Rabe, MD 01/14/2020      Internal Medicine Attending attestation: I agree with the findings and plan of this CCM encounter. Julie Williams, MD  

## 2020-01-14 NOTE — Progress Notes (Signed)
Internal Medicine Clinic Attending  CCM services provided by the care management provider and their documentation were reviewed with Dr. Liang.  We reviewed the pertinent findings, urgent action items addressed by the resident and non-urgent items to be addressed by the PCP.  I agree with the assessment, diagnosis, and plan of care documented in the CCM and resident's note.  Sharon Donald N Sareen Randon, MD 01/14/2020  

## 2020-01-14 NOTE — Progress Notes (Signed)
Internal Medicine Clinic Attending  CCM services provided by the care management provider and their documentation were reviewed with Dr. Koleen Distance.  We reviewed the pertinent findings, urgent action items addressed by the resident and non-urgent items to be addressed by the PCP.  I agree with the assessment, diagnosis, and plan of care documented in the CCM and resident's note.  Oda Kilts, MD 01/14/2020

## 2020-01-14 NOTE — Progress Notes (Addendum)
During this encounter, we addressed her need for a Purewick catheter due to urinary incontinence and not being able to make it to the restroom. This has significantly impacted her quality of life and would benefit from use of Purewick. It has also interfered with her adherence to diuretic use, so it is a medical issue as well, placing her at increased risk of morbidity and mortality from heart failure.  Both her urinary incontinence and diuretic requirements are expected to be lifelong issues. She would otherwise require frequent catheterization, either multiple daily I&O caths or indwelling Foley which would place her at risk of UTIs.   She would require Purewick catheter changes every 8 hours.  It is my medical opinion that given this patient's comorbidities and permanence of her incontinence, a Purewick catheter is necessary for her health and would prevent infections, exacerbations of chronic illnesses, and hospitalizations. It would also be extremely beneficial from a cost perspective.   Edison Simon, MD

## 2020-01-14 NOTE — Chronic Care Management (AMB) (Signed)
Chronic Care Management   Follow Up Note   01/14/2020 Name: Sharon Vasquez [name]: [id number] DOB: 1950-05-28  Referred by: Andrew Au, MD Reason for referral : Chronic Care Management (COPD, HF, HTN, PAF, hx of PE, OSA)   Sharon Vasquez is a 69 y.o. year old female who is a primary care patient of [name], [name], MD. The CCM team was consulted for assistance with chronic disease management and care coordination needs.    Review of patient status, including review of consultants reports, relevant laboratory and other test results, and collaboration with appropriate care team members and the patient's provider was performed as part of comprehensive patient evaluation and provision of chronic care management services.    SDOH (Social Determinants of Health) assessments performed: No See Care Plan activities for detailed interventions related to [facility])     Outpatient Encounter Medications as of 01/14/2020  Medication Sig Note  . albuterol (PROAIR HFA) 108 (90 Base) MCG/ACT inhaler Inhale 2 puffs into the lungs every 6 (six) hours as needed for wheezing or shortness of breath.   [name] albuterol (PROVENTIL) (2.5 MG/3ML) 0.083% nebulizer solution TAKE 3 MLS(1 AMPULE) BY NEBULIZATION EVERY 4 HOURS AS NEEDED FOR WHHEZING OR SHORTNESS OF BREATH (Patient taking differently: Take 2.5 mg by nebulization every 4 (four) hours as needed for wheezing or shortness of breath. ) 06/24/2019: Use once every two weeks  . aspirin EC 81 MG tablet Take 81 mg by mouth daily.   [name] atorvastatin (LIPITOR) 40 MG tablet Take 1 tablet (40 mg total) by mouth daily.   . benazepril (LOTENSIN) 40 MG tablet Take 1 tablet (40 mg total) by mouth daily.   [name] esomeprazole (NEXIUM) 40 MG capsule Take 1 capsule (40 mg total) by mouth daily as needed (for heartburn or indigestion).   . Fluticasone-Salmeterol (ADVAIR DISKUS) 250-50 MCG/DOSE AEPB Inhale 1 puff into the lungs 2 (two) times daily. Rinse mouth after each use   . gabapentin  (NEURONTIN) 400 MG capsule Take 1 capsule (400 mg total) by mouth 2 (two) times daily.   . hydrALAZINE (APRESOLINE) 25 MG tablet Take 1 tablet (25 mg total) by mouth 3 (three) times daily.   . metoprolol succinate (TOPROL-XL) 100 MG 24 hr tablet TAKE 1 TABLET(100 MG) BY MOUTH DAILY WITH OR IMMEDIATELY FOLLOWING A MEAL   . mirabegron ER (MYRBETRIQ) 50 MG TB24 tablet Take 50 mg by mouth daily. 11/20/2019: Patient states she received 30 day sample from urologist on 11/20/19  . oxyCODONE-acetaminophen (PERCOCET/ROXICET) 5-325 MG tablet Take 1 tablet by mouth daily as needed for severe pain. 06/24/2019: Takes every 6 hours round the clock  . potassium chloride (KLOR-CON) 10 MEQ tablet TAKE 2 TABLETS(20 MEQ) BY MOUTH TWICE DAILY (Patient taking differently: Take 20 mEq by mouth 2 (two) times daily. )   . rivaroxaban (XARELTO) 20 MG TABS tablet Take 1 tablet (20 mg total) by mouth every morning.   . tiotropium (SPIRIVA HANDIHALER) 18 MCG inhalation capsule PLACE 1 CAPSULE INTO INHALER AND INHALE DAILY   . torsemide (DEMADEX) 20 MG tablet Take 2 tablets (40 mg total) by mouth daily.    No facility-administered encounter medications on file as of 01/14/2020.     Objective:  Wt Readings from Last 3 Encounters:  12/25/19 (!) 374 lb 3.2 oz (169.7 kg)  11/10/19 (!) 376 lb 9.6 oz (170.8 kg)  10/24/19 (!) 378 lb 12.8 oz (171.8 kg)   BP Readings from Last 3 Encounters:  12/25/19 112/80  11/10/19  114/67  10/24/19 120/82    Goals Addressed              This Visit's Progress     Patient Stated   .  "Can you call my [facility] navigator?  I don't think he's doing his job." (pt-stated)        CARE PLAN ENTRY (see longitudinal plan of care for additional care plan information)  Current Barriers:  . Care Coordination needs related to securing electric scooter and in network oral surgeons in a patient with COPD, PAF, HF, HTN and morbid obesity.- received call from [place] at [place]- she states the letter  of medical necessity needs to include the frequency of catheter change and be signed and dated  Nurse Case Manager Clinical Goal(s):  [name] Over the next 30- 90 days, patient will work with [place]  to secure electric scooter and in network oral surgeons and PureWick urinary collection system  Interventions:  . Inter-disciplinary care team collaboration (see longitudinal plan of care) . Messaged Dr. [name] regarding  the revisions needed for the letter of medical necessity for prior approval of the PureWick . 10/13- updated signed and dated letter with additional required documentation received from Dr [name] to support need for PureWick for patient. Letter faxed to [place] at [phone] with successful fax verification received  Patient Self Care Activities:  . Patient verbalizes understanding of plan to work with [facility] navigator and CCM RN to address her concerns related to directory of in network oral surgeons, securing an Transport planner and assisting with securing the PureWick for home use . Unable to independently secure in network providers and DME  Please see past updates related to this goal by clicking on the "Past Updates" button in the selected goal          Plan:   The care management team will reach out to the patient again over the next 30-60 days.    Kelli Churn RN, CCM, [facility] RN Care Manager [phone]

## 2020-01-15 ENCOUNTER — Ambulatory Visit: Payer: Medicare Other | Admitting: *Deleted

## 2020-01-15 DIAGNOSIS — I480 Paroxysmal atrial fibrillation: Secondary | ICD-10-CM

## 2020-01-15 DIAGNOSIS — R32 Unspecified urinary incontinence: Secondary | ICD-10-CM

## 2020-01-15 DIAGNOSIS — I5032 Chronic diastolic (congestive) heart failure: Secondary | ICD-10-CM

## 2020-01-15 DIAGNOSIS — I10 Essential (primary) hypertension: Secondary | ICD-10-CM

## 2020-01-15 DIAGNOSIS — J449 Chronic obstructive pulmonary disease, unspecified: Secondary | ICD-10-CM

## 2020-01-15 NOTE — Patient Instructions (Signed)
Visit Information It was nice speaking with you today. Goals Addressed              This Visit's Progress     Patient Stated   .  "Can you call my [facility] navigator?  I don't think he's doing his job." (pt-stated)        CARE PLAN ENTRY (see longitudinal plan of care for additional care plan information)  Current Barriers:  . Care Coordination needs related to securing electric scooter and in network oral surgeons in a patient with COPD, PAF, HF, HTN and morbid obesity.- called patient to update her on status of preauthorization for the PureWick  Nurse Case Manager Clinical Goal(s):  [name] Over the next 30- 90 days, patient will work with [place]  to secure electric scooter and in network oral surgeons and PureWick urinary collection system  Interventions:  . Inter-disciplinary care team collaboration (see longitudinal plan of care) . Addended clinic note of 11/10/19 successfully faxed to [facility] at [phone] . Patient updated on status of preauthorization process  Patient Self Care Activities:  . Patient verbalizes understanding of plan to work with [facility] navigator and CCM RN to address her concerns related to directory of in network oral surgeons, securing an Transport planner and assisting with securing the PureWick for home use . Unable to independently secure in network providers and DME  Please see past updates related to this goal by clicking on the "Past Updates" button in the selected goal        Other   .  "I need a scooter to help me get around"        Current Barriers:  [name] Knowledge Barriers related to resources and support available to address needs related to Inability to perform ADL's independently; need for DME.- returned call to patient to update her on the status of the preauthorization process for the electric scooter from [place]):  [name] Over the next 30-90  days, patient will work with BSW to  address needs related to Inability to perform ADL's independently, need for DME.   . Over the next 30 -90 days, BSW will collaborate with RN Care Manager to address care management and care coordination needs  Interventions:  . Successfully received faxed package of information from [facility] and Mobility; will request Dr Heber [place] to sign the OT seating evaluation that was completed by Luetta Nutting Ward OTR/L, BCPR, ATP/SMS on 12/01/19 . Faxed clinic note of 10/23/19 to Elby Showers, [employer] at [phone] . Updated patient on status of electric scooter preauthorization process  Patient Self Care Activities:  . Attends all scheduled provider appointments . Calls provider office for new concerns or questions  Please see past updates related to this goal by clicking on the "Past Updates" button in the selected goal         The patient verbalized understanding of instructions provided today and declined a print copy of patient instruction materials.   The care management team will reach out to the patient again over the next 30-60 days.   Kelli Churn RN, CCM, [facility] RN Care Manager [phone]

## 2020-01-15 NOTE — Chronic Care Management (AMB) (Addendum)
Chronic Care Management   Follow Up Note   01/15/2020 Name: Sharon Vasquez [name]: [id number] DOB: [birth year]  Referred by: Andrew Au, MD Reason for referral : Chronic Care Management (COPD, HF, HTN, PAF, hx of PE, OSA)   Sharon Vasquez is a 69 y.o. year old female who is a primary care patient of [name], [name], MD. The CCM team was consulted for assistance with chronic disease management and care coordination needs.    Review of patient status, including review of consultants reports, relevant laboratory and other test results, and collaboration with appropriate care team members and the patient's provider was performed as part of comprehensive patient evaluation and provision of chronic care management services.    SDOH (Social Determinants of Health) assessments performed: No See Care Plan activities for detailed interventions related to [facility])     Outpatient Encounter Medications as of 01/15/2020  Medication Sig Note  . albuterol (PROAIR HFA) 108 (90 Base) MCG/ACT inhaler Inhale 2 puffs into the lungs every 6 (six) hours as needed for wheezing or shortness of breath.   [name] albuterol (PROVENTIL) (2.5 MG/3ML) 0.083% nebulizer solution TAKE 3 MLS(1 AMPULE) BY NEBULIZATION EVERY 4 HOURS AS NEEDED FOR WHHEZING OR SHORTNESS OF BREATH (Patient taking differently: Take 2.5 mg by nebulization every 4 (four) hours as needed for wheezing or shortness of breath. ) 06/24/2019: Use once every two weeks  . aspirin EC 81 MG tablet Take 81 mg by mouth daily.   [name] atorvastatin (LIPITOR) 40 MG tablet Take 1 tablet (40 mg total) by mouth daily.   . benazepril (LOTENSIN) 40 MG tablet Take 1 tablet (40 mg total) by mouth daily.   [name] esomeprazole (NEXIUM) 40 MG capsule Take 1 capsule (40 mg total) by mouth daily as needed (for heartburn or indigestion).   . Fluticasone-Salmeterol (ADVAIR DISKUS) 250-50 MCG/DOSE AEPB Inhale 1 puff into the lungs 2 (two) times daily. Rinse mouth after each use   . gabapentin  (NEURONTIN) 400 MG capsule Take 1 capsule (400 mg total) by mouth 2 (two) times daily.   . hydrALAZINE (APRESOLINE) 25 MG tablet Take 1 tablet (25 mg total) by mouth 3 (three) times daily.   . metoprolol succinate (TOPROL-XL) 100 MG 24 hr tablet TAKE 1 TABLET(100 MG) BY MOUTH DAILY WITH OR IMMEDIATELY FOLLOWING A MEAL   . mirabegron ER (MYRBETRIQ) 50 MG TB24 tablet Take 50 mg by mouth daily. 11/20/2019: Patient states she received 30 day sample from urologist on 11/20/19  . oxyCODONE-acetaminophen (PERCOCET/ROXICET) 5-325 MG tablet Take 1 tablet by mouth daily as needed for severe pain. 06/24/2019: Takes every 6 hours round the clock  . potassium chloride (KLOR-CON) 10 MEQ tablet TAKE 2 TABLETS(20 MEQ) BY MOUTH TWICE DAILY (Patient taking differently: Take 20 mEq by mouth 2 (two) times daily. )   . rivaroxaban (XARELTO) 20 MG TABS tablet Take 1 tablet (20 mg total) by mouth every morning.   . tiotropium (SPIRIVA HANDIHALER) 18 MCG inhalation capsule PLACE 1 CAPSULE INTO INHALER AND INHALE DAILY   . torsemide (DEMADEX) 20 MG tablet Take 2 tablets (40 mg total) by mouth daily.    No facility-administered encounter medications on file as of 01/15/2020.     Objective:  BP Readings from Last 3 Encounters:  12/25/19 112/80  11/10/19 114/67  10/24/19 120/82   Wt Readings from Last 3 Encounters:  12/25/19 (!) 374 lb 3.2 oz (169.7 kg)  11/10/19 (!) 376 lb 9.6 oz (170.8 kg)  10/24/19 (!) 378 lb  12.8 oz (171.8 kg)    Goals Addressed              This Visit's Progress     Patient Stated   .  "Can you call my [facility] navigator?  I don't think he's doing his job." (pt-stated)        CARE PLAN ENTRY (see longitudinal plan of care for additional care plan information)  Current Barriers:  . Care Coordination needs related to securing electric scooter and in network oral surgeons in a patient with COPD, PAF, HF, HTN and morbid obesity.- called patient to update her on status of preauthorization for  the PureWick  Nurse Case Manager Clinical Goal(s):  [name] Over the next 30- 90 days, patient will work with [place]  to secure electric scooter and in network oral surgeons and PureWick urinary collection system  Interventions:  . Inter-disciplinary care team collaboration (see longitudinal plan of care) . Addended clinic note of 11/10/19 successfully faxed to [facility] at [phone] . Patient updated on status of preauthorization process for PureWIck  Patient Self Care Activities:  . Patient verbalizes understanding of plan to work with [facility] navigator and CCM RN to address her concerns related to directory of in network oral surgeons, securing an Transport planner and assisting with securing the PureWick for home use . Unable to independently secure in network providers and DME  Please see past updates related to this goal by clicking on the "Past Updates" button in the selected goal        Other   .  "I need a scooter to help me get around"        Current Barriers:  [name] Knowledge Barriers related to resources and support available to address needs related to Inability to perform ADL's independently; need for DME.- returned call to patient to update her on the status of the preauthorization process for the electric scooter from [place]):  [name] Over the next 30-90  days, patient will work with BSW to address needs related to Inability to perform ADL's independently, need for DME.   . Over the next 30 -90 days, BSW will collaborate with RN Care Manager to address care management and care coordination needs  Interventions:  . Successfully received faxed package of information from [facility] and Mobility; will request Dr. Heber [place] to sign the OT seating evaluation that was completed by Luetta Nutting Ward OTR/L, BCPR, ATP/SMS on 12/01/19 . Faxed clinic note of 10/23/19 to Elby Showers, [employer] at [phone].  also left voice mail and sent secure e-mail to [place] at [email or web address] asking if the 10/23/19 clinic notes are adequate or if patient needs another clinic appointment specific for Mobility Evaluation . Updated patient on status of electric scooter preauthorization process  Patient Self Care Activities:  . Attends all scheduled provider appointments . Calls provider office for new concerns or questions  Please see past updates related to this goal by clicking on the "Past Updates" button in the selected goal          Plan:   The care management team will reach out to the patient again over the next 30-60 days.    Kelli Churn RN, CCM, [facility] RN Care Manager [phone]

## 2020-01-16 NOTE — Progress Notes (Signed)
Internal Medicine Clinic Resident  I have personally reviewed this encounter including the documentation in this note and/or discussed this patient with the care management provider. I will address any urgent items identified by the care management provider and will communicate my actions to the patient's PCP. I have reviewed the patient's CCM visit with my supervising attending, Dr Rebeca Alert.  Iona Beard, MD 01/16/2020

## 2020-01-19 ENCOUNTER — Ambulatory Visit: Payer: Medicare Other | Admitting: *Deleted

## 2020-01-19 DIAGNOSIS — I10 Essential (primary) hypertension: Secondary | ICD-10-CM

## 2020-01-19 DIAGNOSIS — R32 Unspecified urinary incontinence: Secondary | ICD-10-CM

## 2020-01-19 DIAGNOSIS — J449 Chronic obstructive pulmonary disease, unspecified: Secondary | ICD-10-CM

## 2020-01-19 DIAGNOSIS — I480 Paroxysmal atrial fibrillation: Secondary | ICD-10-CM

## 2020-01-19 DIAGNOSIS — I5032 Chronic diastolic (congestive) heart failure: Secondary | ICD-10-CM

## 2020-01-19 NOTE — Chronic Care Management (AMB) (Signed)
Chronic Care Management   Follow Up Note   01/19/2020 Name: Sharon Vasquez [name]: [id number] DOB: 1950/06/23  Referred by: Andrew Au, MD Reason for referral : Chronic Care Management (COPD, HF, HTN, PAF, hx of PE, OSA)   Sharon Vasquez is a 69 y.o. year old female who is a primary care patient of [name], [name], MD. The CCM team was consulted for assistance with chronic disease management and care coordination needs.    Review of patient status, including review of consultants reports, relevant laboratory and other test results, and collaboration with appropriate care team members and the patient's provider was performed as part of comprehensive patient evaluation and provision of chronic care management services.    SDOH (Social Determinants of Health) assessments performed: No See Care Plan activities for detailed interventions related to [facility])     Outpatient Encounter Medications as of 01/19/2020  Medication Sig Note  . albuterol (PROAIR HFA) 108 (90 Base) MCG/ACT inhaler Inhale 2 puffs into the lungs every 6 (six) hours as needed for wheezing or shortness of breath.   [name] albuterol (PROVENTIL) (2.5 MG/3ML) 0.083% nebulizer solution TAKE 3 MLS(1 AMPULE) BY NEBULIZATION EVERY 4 HOURS AS NEEDED FOR WHHEZING OR SHORTNESS OF BREATH (Patient taking differently: Take 2.5 mg by nebulization every 4 (four) hours as needed for wheezing or shortness of breath. ) 06/24/2019: Use once every two weeks  . aspirin EC 81 MG tablet Take 81 mg by mouth daily.   [name] atorvastatin (LIPITOR) 40 MG tablet Take 1 tablet (40 mg total) by mouth daily.   . benazepril (LOTENSIN) 40 MG tablet Take 1 tablet (40 mg total) by mouth daily.   [name] esomeprazole (NEXIUM) 40 MG capsule Take 1 capsule (40 mg total) by mouth daily as needed (for heartburn or indigestion).   . Fluticasone-Salmeterol (ADVAIR DISKUS) 250-50 MCG/DOSE AEPB Inhale 1 puff into the lungs 2 (two) times daily. Rinse mouth after each use   . gabapentin  (NEURONTIN) 400 MG capsule Take 1 capsule (400 mg total) by mouth 2 (two) times daily.   . hydrALAZINE (APRESOLINE) 25 MG tablet Take 1 tablet (25 mg total) by mouth 3 (three) times daily.   . metoprolol succinate (TOPROL-XL) 100 MG 24 hr tablet TAKE 1 TABLET(100 MG) BY MOUTH DAILY WITH OR IMMEDIATELY FOLLOWING A MEAL   . mirabegron ER (MYRBETRIQ) 50 MG TB24 tablet Take 50 mg by mouth daily. 11/20/2019: Patient states she received 30 day sample from urologist on 11/20/19  . oxyCODONE-acetaminophen (PERCOCET/ROXICET) 5-325 MG tablet Take 1 tablet by mouth daily as needed for severe pain. 06/24/2019: Takes every 6 hours round the clock  . potassium chloride (KLOR-CON) 10 MEQ tablet TAKE 2 TABLETS(20 MEQ) BY MOUTH TWICE DAILY (Patient taking differently: Take 20 mEq by mouth 2 (two) times daily. )   . rivaroxaban (XARELTO) 20 MG TABS tablet Take 1 tablet (20 mg total) by mouth every morning.   . tiotropium (SPIRIVA HANDIHALER) 18 MCG inhalation capsule PLACE 1 CAPSULE INTO INHALER AND INHALE DAILY   . torsemide (DEMADEX) 20 MG tablet Take 2 tablets (40 mg total) by mouth daily.    No facility-administered encounter medications on file as of 01/19/2020.     Objective:  Wt Readings from Last 3 Encounters:  12/25/19 (!) 374 lb 3.2 oz (169.7 kg)  11/10/19 (!) 376 lb 9.6 oz (170.8 kg)  10/24/19 (!) 378 lb 12.8 oz (171.8 kg)   BP Readings from Last 3 Encounters:  12/25/19 112/80  11/10/19  114/67  10/24/19 120/82    Goals Addressed            This Visit's Progress   . "I need a scooter to help me get around"       Current Barriers:  [name] Knowledge Barriers related to resources and support available to address needs related to Inability to perform ADL's independently; need for DME.- returned call to patient to update her on the status of the preauthorization process for the electric scooter from [place]):  [name] Over the next 30-90  days, patient will work with  BSW to address needs related to Inability to perform ADL's independently, need for DME.   . Over the next 30 -90 days, BSW will collaborate with RN Care Manager to address care management and care coordination needs  Interventions:  . Successfully received faxed package of information from [facility] and Mobility; will request Dr Heber Warren to sign the OT seating evaluation that was completed by Luetta Nutting Ward OTR/L, BCPR, ATP/SMS on 12/01/19 . 01/19/20- Placed order form for electric scooter and OT evaluation in Dr. Jodene Nam clinic mailbox for completion. . Faxed clinic note of 10/23/19 to Elby Showers, [employer] at [phone] . Updated patient on status of electric scooter preauthorization process   Patient Self Care Activities:  . Attends all scheduled provider appointments . Calls provider office for new concerns or questions  Please see past updates related to this goal by clicking on the "Past Updates" button in the selected goal          Plan:   The care management team will reach out to the patient again over the next 30 days.    Kelli Churn RN, CCM, [facility] RN Care Manager [phone]

## 2020-01-20 ENCOUNTER — Ambulatory Visit: Payer: Medicare Other | Admitting: *Deleted

## 2020-01-20 DIAGNOSIS — I480 Paroxysmal atrial fibrillation: Secondary | ICD-10-CM

## 2020-01-20 DIAGNOSIS — I5032 Chronic diastolic (congestive) heart failure: Secondary | ICD-10-CM

## 2020-01-20 DIAGNOSIS — I10 Essential (primary) hypertension: Secondary | ICD-10-CM

## 2020-01-20 DIAGNOSIS — J449 Chronic obstructive pulmonary disease, unspecified: Secondary | ICD-10-CM

## 2020-01-20 NOTE — Chronic Care Management (AMB) (Signed)
Chronic Care Management   Follow Up Note   01/20/2020 Name: Sharon Vasquez [name]: [id number] DOB: [birth year]  Referred by: Andrew Au, MD Reason for referral : Chronic Care Management (COPD, HF, HTN, PAF, hx of PE, OSA)   Sharon Vasquez is a 69 y.o. year old female who is a primary care patient of [name], [name], MD. The CCM team was consulted for assistance with chronic disease management and care coordination needs.    Review of patient status, including review of consultants reports, relevant laboratory and other test results, and collaboration with appropriate care team members and the patient's provider was performed as part of comprehensive patient evaluation and provision of chronic care management services.    SDOH (Social Determinants of Health) assessments performed: No See Care Plan activities for detailed interventions related to [facility])     Outpatient Encounter Medications as of 01/20/2020  Medication Sig Note  . albuterol (PROAIR HFA) 108 (90 Base) MCG/ACT inhaler Inhale 2 puffs into the lungs every 6 (six) hours as needed for wheezing or shortness of breath.   [name] albuterol (PROVENTIL) (2.5 MG/3ML) 0.083% nebulizer solution TAKE 3 MLS(1 AMPULE) BY NEBULIZATION EVERY 4 HOURS AS NEEDED FOR WHHEZING OR SHORTNESS OF BREATH (Patient taking differently: Take 2.5 mg by nebulization every 4 (four) hours as needed for wheezing or shortness of breath. ) 06/24/2019: Use once every two weeks  . aspirin EC 81 MG tablet Take 81 mg by mouth daily.   [name] atorvastatin (LIPITOR) 40 MG tablet Take 1 tablet (40 mg total) by mouth daily.   . benazepril (LOTENSIN) 40 MG tablet Take 1 tablet (40 mg total) by mouth daily.   [name] esomeprazole (NEXIUM) 40 MG capsule Take 1 capsule (40 mg total) by mouth daily as needed (for heartburn or indigestion).   . Fluticasone-Salmeterol (ADVAIR DISKUS) 250-50 MCG/DOSE AEPB Inhale 1 puff into the lungs 2 (two) times daily. Rinse mouth after each use   . gabapentin  (NEURONTIN) 400 MG capsule Take 1 capsule (400 mg total) by mouth 2 (two) times daily.   . hydrALAZINE (APRESOLINE) 25 MG tablet Take 1 tablet (25 mg total) by mouth 3 (three) times daily.   . metoprolol succinate (TOPROL-XL) 100 MG 24 hr tablet TAKE 1 TABLET(100 MG) BY MOUTH DAILY WITH OR IMMEDIATELY FOLLOWING A MEAL   . mirabegron ER (MYRBETRIQ) 50 MG TB24 tablet Take 50 mg by mouth daily. 11/20/2019: Patient states she received 30 day sample from urologist on 11/20/19  . oxyCODONE-acetaminophen (PERCOCET/ROXICET) 5-325 MG tablet Take 1 tablet by mouth daily as needed for severe pain. 06/24/2019: Takes every 6 hours round the clock  . potassium chloride (KLOR-CON) 10 MEQ tablet TAKE 2 TABLETS(20 MEQ) BY MOUTH TWICE DAILY (Patient taking differently: Take 20 mEq by mouth 2 (two) times daily. )   . rivaroxaban (XARELTO) 20 MG TABS tablet Take 1 tablet (20 mg total) by mouth every morning.   . tiotropium (SPIRIVA HANDIHALER) 18 MCG inhalation capsule PLACE 1 CAPSULE INTO INHALER AND INHALE DAILY   . torsemide (DEMADEX) 20 MG tablet Take 2 tablets (40 mg total) by mouth daily.    No facility-administered encounter medications on file as of 01/20/2020.     Objective:  Wt Readings from Last 3 Encounters:  12/25/19 (!) 374 lb 3.2 oz (169.7 kg)  11/10/19 (!) 376 lb 9.6 oz (170.8 kg)  10/24/19 (!) 378 lb 12.8 oz (171.8 kg)   BP Readings from Last 3 Encounters:  12/25/19 112/80  11/10/19  114/67  10/24/19 120/82     Goals Addressed            This Visit's Progress   . "I need a scooter to help me get around"       Current Barriers:  [name] Knowledge Barriers related to resources and support available to address needs related to Inability to perform ADL's independently; need for DME.- returned call to patient to update her on the status of the preauthorization process for the electric scooter from [place]):  [name] Over the next 30-90  days, patient will work  with BSW to address needs related to Inability to perform ADL's independently, need for DME.   . Over the next 30 -90 days, BSW will collaborate with RN Care Manager to address care management and care coordination needs  Interventions:  . Successfully received faxed package of information from [facility] and [employer]; will request Dr Heber  to sign the OT seating evaluation that was completed by Luetta Nutting Ward OTR/L, BCPR, ATP/SMS on 12/01/19 . 01/19/20- Placed order form for electric scooter and OT evaluation in Dr. Jodene Nam clinic mailbox for completion. . Faxed clinic note of 10/23/19 to Elby Showers, [employer] at [phone] . Updated patient on status of electric scooter preauthorization process . 10/19 at 12:35 pm - Received voice mail from [employer] with [employer] and [employer] with patient also on the phone requesting return of signed order for scooter, signed OT seating evaluation and letter of medical necessity. Will message provider about need for items to be faxed to the scooter company ASAP.    Patient Self Care Activities:  . Attends all scheduled provider appointments . Calls provider office for new concerns or questions  Please see past updates related to this goal by clicking on the "Past Updates" button in the selected goal          Plan:   The care management team will reach out to the patient again over the next 30 days.    Kelli Churn RN, CCM, [facility] RN Care Manager [phone]

## 2020-01-21 ENCOUNTER — Other Ambulatory Visit: Payer: Self-pay | Admitting: Gastroenterology

## 2020-01-21 ENCOUNTER — Ambulatory Visit: Payer: Medicare Other | Admitting: *Deleted

## 2020-01-21 ENCOUNTER — Ambulatory Visit
Admission: RE | Admit: 2020-01-21 | Discharge: 2020-01-21 | Disposition: A | Discharge status: Home / Self Care | Payer: Medicare Other | Source: Ambulatory Visit | Attending: Gastroenterology | Admitting: Gastroenterology

## 2020-01-21 DIAGNOSIS — I5032 Chronic diastolic (congestive) heart failure: Secondary | ICD-10-CM

## 2020-01-21 DIAGNOSIS — R1319 Other dysphagia: Secondary | ICD-10-CM

## 2020-01-21 DIAGNOSIS — I480 Paroxysmal atrial fibrillation: Secondary | ICD-10-CM

## 2020-01-21 DIAGNOSIS — I10 Essential (primary) hypertension: Secondary | ICD-10-CM

## 2020-01-21 DIAGNOSIS — K225 Diverticulum of esophagus, acquired: Secondary | ICD-10-CM | POA: Diagnosis not present

## 2020-01-21 NOTE — Chronic Care Management (AMB) (Signed)
Chronic Care Management   Follow Up Note   01/21/2020 Name: Sharon Vasquez [name]: [id number] DOB: [birth year]  Referred by: Andrew Au, MD Reason for referral : Chronic Care Management (COPD, HF, HTN, PAF, hx of PE, OSA)   Sharon Vasquez is a 69 y.o. year old female who is a primary care patient of [name], [name], MD. The CCM team was consulted for assistance with chronic disease management and care coordination needs.    Review of patient status, including review of consultants reports, relevant laboratory and other test results, and collaboration with appropriate care team members and the patient's provider was performed as part of comprehensive patient evaluation and provision of chronic care management services.    SDOH (Social Determinants of Health) assessments performed: No See Care Plan activities for detailed interventions related to [facility])     Outpatient Encounter Medications as of 01/21/2020  Medication Sig Note  . albuterol (PROAIR HFA) 108 (90 Base) MCG/ACT inhaler Inhale 2 puffs into the lungs every 6 (six) hours as needed for wheezing or shortness of breath.   [name] albuterol (PROVENTIL) (2.5 MG/3ML) 0.083% nebulizer solution TAKE 3 MLS(1 AMPULE) BY NEBULIZATION EVERY 4 HOURS AS NEEDED FOR WHHEZING OR SHORTNESS OF BREATH (Patient taking differently: Take 2.5 mg by nebulization every 4 (four) hours as needed for wheezing or shortness of breath. ) 06/24/2019: Use once every two weeks  . aspirin EC 81 MG tablet Take 81 mg by mouth daily.   [name] atorvastatin (LIPITOR) 40 MG tablet Take 1 tablet (40 mg total) by mouth daily.   . benazepril (LOTENSIN) 40 MG tablet Take 1 tablet (40 mg total) by mouth daily.   [name] esomeprazole (NEXIUM) 40 MG capsule Take 1 capsule (40 mg total) by mouth daily as needed (for heartburn or indigestion).   . Fluticasone-Salmeterol (ADVAIR DISKUS) 250-50 MCG/DOSE AEPB Inhale 1 puff into the lungs 2 (two) times daily. Rinse mouth after each use   . gabapentin  (NEURONTIN) 400 MG capsule Take 1 capsule (400 mg total) by mouth 2 (two) times daily.   . hydrALAZINE (APRESOLINE) 25 MG tablet Take 1 tablet (25 mg total) by mouth 3 (three) times daily.   . metoprolol succinate (TOPROL-XL) 100 MG 24 hr tablet TAKE 1 TABLET(100 MG) BY MOUTH DAILY WITH OR IMMEDIATELY FOLLOWING A MEAL   . mirabegron ER (MYRBETRIQ) 50 MG TB24 tablet Take 50 mg by mouth daily. 11/20/2019: Patient states she received 30 day sample from urologist on 11/20/19  . oxyCODONE-acetaminophen (PERCOCET/ROXICET) 5-325 MG tablet Take 1 tablet by mouth daily as needed for severe pain. 06/24/2019: Takes every 6 hours round the clock  . potassium chloride (KLOR-CON) 10 MEQ tablet TAKE 2 TABLETS(20 MEQ) BY MOUTH TWICE DAILY (Patient taking differently: Take 20 mEq by mouth 2 (two) times daily. )   . rivaroxaban (XARELTO) 20 MG TABS tablet Take 1 tablet (20 mg total) by mouth every morning.   . tiotropium (SPIRIVA HANDIHALER) 18 MCG inhalation capsule PLACE 1 CAPSULE INTO INHALER AND INHALE DAILY   . torsemide (DEMADEX) 20 MG tablet Take 2 tablets (40 mg total) by mouth daily.    No facility-administered encounter medications on file as of 01/21/2020.     Objective:  Wt Readings from Last 3 Encounters:  12/25/19 (!) 374 lb 3.2 oz (169.7 kg)  11/10/19 (!) 376 lb 9.6 oz (170.8 kg)  10/24/19 (!) 378 lb 12.8 oz (171.8 kg)   BP Readings from Last 3 Encounters:  12/25/19 112/80  11/10/19  114/67  10/24/19 120/82    Goals Addressed            This Visit's Progress   . "I need a scooter to help me get around"       Current Barriers:  [name] Knowledge Barriers related to resources and support available to address needs related to Inability to perform ADL's independently; need for DME.- returned call to patient to follow up on voice message left for this CCM RN yesterday regarding reason requested information related to electric scooter has not been faxed back to [employer] and [employer]. Advised  patient that paperwork was placed in Dr [employer] mailbox for signature on 10/18 along with message in Epic asking Dr Heber [place] to complete paperwork and place in this CCM RN's mail box, but it has not yet been signed because Dr Heber [place] has not been in the clinic.  Case Manager Clinical Goal(s):  [name] Over the next 30-90  days, patient will work with BSW to address needs related to Inability to perform ADL's independently, need for DME.   . Over the next 30 -90 days, BSW will collaborate with RN Care Manager to address care management and care coordination needs  Interventions:  . Successfully received faxed package of information from [facility] and [employer]; will request Dr Heber [place] to sign the OT seating evaluation that was completed by Luetta Nutting Ward OTR/L, BCPR, ATP/SMS on 12/01/19 . 01/19/20- Placed order form for electric scooter and OT evaluation in Dr. Jodene Nam clinic mailbox for completion. . Faxed clinic note of 10/23/19 to Elby Showers, [employer] at [phone] . Updated patient on status of electric scooter preauthorization process . 10/19 at 12:35 pm - Received voice mail from [employer] with [employer] and [employer] with patient also on the phone requesting return of signed order for scooter, signed OT seating evaluation and letter of medical necessity. Will message provider about need for items to be faxed to the scooter company ASAP.  [name] 10/20- After consulting with clinic nursing director Yvonna Alanis, sent e-mail to Dr Heber [place] requesting completion of paperwork and offering to hand deliver the paperwork to him since he is not in the clinic this week. Advised patient of these actions by CCM RN.   Patient Self Care Activities:  . Attends all scheduled provider appointments . Calls provider office for new concerns or questions  Please see past updates related to this goal by clicking on the "Past Updates" button in the selected goal          Plan:    The care management team will reach out to the patient again over the next 30 days.    Kelli Churn RN, CCM, [facility] RN Care Manager [phone]

## 2020-01-22 ENCOUNTER — Ambulatory Visit: Payer: Medicare Other | Admitting: *Deleted

## 2020-01-22 DIAGNOSIS — J449 Chronic obstructive pulmonary disease, unspecified: Secondary | ICD-10-CM

## 2020-01-22 DIAGNOSIS — I480 Paroxysmal atrial fibrillation: Secondary | ICD-10-CM

## 2020-01-22 DIAGNOSIS — R32 Unspecified urinary incontinence: Secondary | ICD-10-CM

## 2020-01-22 DIAGNOSIS — I5032 Chronic diastolic (congestive) heart failure: Secondary | ICD-10-CM

## 2020-01-22 DIAGNOSIS — I10 Essential (primary) hypertension: Secondary | ICD-10-CM

## 2020-01-22 NOTE — Chronic Care Management (AMB) (Signed)
Chronic Care Management   Follow Up Note   01/22/2020 Name: Sharon Vasquez [name]: [id number] DOB: September 06, 1950  Referred by: Andrew Au, MD Reason for referral : Chronic Care Management (COPD, HF, HTN, PAF, hx of PE, OSA)   Sharon Vasquez is a 69 y.o. year old female who is a primary care patient of [name], [name], MD. The CCM team was consulted for assistance with chronic disease management and care coordination needs.    Review of patient status, including review of consultants reports, relevant laboratory and other test results, and collaboration with appropriate care team members and the patient's provider was performed as part of comprehensive patient evaluation and provision of chronic care management services.    SDOH (Social Determinants of Health) assessments performed: No See Care Plan activities for detailed interventions related to [facility])     Outpatient Encounter Medications as of 01/22/2020  Medication Sig Note  . albuterol (PROAIR HFA) 108 (90 Base) MCG/ACT inhaler Inhale 2 puffs into the lungs every 6 (six) hours as needed for wheezing or shortness of breath.   [name] albuterol (PROVENTIL) (2.5 MG/3ML) 0.083% nebulizer solution TAKE 3 MLS(1 AMPULE) BY NEBULIZATION EVERY 4 HOURS AS NEEDED FOR WHHEZING OR SHORTNESS OF BREATH (Patient taking differently: Take 2.5 mg by nebulization every 4 (four) hours as needed for wheezing or shortness of breath. ) 06/24/2019: Use once every two weeks  . aspirin EC 81 MG tablet Take 81 mg by mouth daily.   [name] atorvastatin (LIPITOR) 40 MG tablet Take 1 tablet (40 mg total) by mouth daily.   . benazepril (LOTENSIN) 40 MG tablet Take 1 tablet (40 mg total) by mouth daily.   [name] esomeprazole (NEXIUM) 40 MG capsule Take 1 capsule (40 mg total) by mouth daily as needed (for heartburn or indigestion).   . Fluticasone-Salmeterol (ADVAIR DISKUS) 250-50 MCG/DOSE AEPB Inhale 1 puff into the lungs 2 (two) times daily. Rinse mouth after each use   . gabapentin  (NEURONTIN) 400 MG capsule Take 1 capsule (400 mg total) by mouth 2 (two) times daily.   . hydrALAZINE (APRESOLINE) 25 MG tablet Take 1 tablet (25 mg total) by mouth 3 (three) times daily.   . metoprolol succinate (TOPROL-XL) 100 MG 24 hr tablet TAKE 1 TABLET(100 MG) BY MOUTH DAILY WITH OR IMMEDIATELY FOLLOWING A MEAL   . mirabegron ER (MYRBETRIQ) 50 MG TB24 tablet Take 50 mg by mouth daily. 11/20/2019: Patient states she received 30 day sample from urologist on 11/20/19  . oxyCODONE-acetaminophen (PERCOCET/ROXICET) 5-325 MG tablet Take 1 tablet by mouth daily as needed for severe pain. 06/24/2019: Takes every 6 hours round the clock  . potassium chloride (KLOR-CON) 10 MEQ tablet TAKE 2 TABLETS(20 MEQ) BY MOUTH TWICE DAILY (Patient taking differently: Take 20 mEq by mouth 2 (two) times daily. )   . rivaroxaban (XARELTO) 20 MG TABS tablet Take 1 tablet (20 mg total) by mouth every morning.   . tiotropium (SPIRIVA HANDIHALER) 18 MCG inhalation capsule PLACE 1 CAPSULE INTO INHALER AND INHALE DAILY   . torsemide (DEMADEX) 20 MG tablet Take 2 tablets (40 mg total) by mouth daily.    No facility-administered encounter medications on file as of 01/22/2020.     Objective:  Wt Readings from Last 3 Encounters:  12/25/19 (!) 374 lb 3.2 oz (169.7 kg)  11/10/19 (!) 376 lb 9.6 oz (170.8 kg)  10/24/19 (!) 378 lb 12.8 oz (171.8 kg)   BP Readings from Last 3 Encounters:  12/25/19 112/80  11/10/19  114/67  10/24/19 120/82    Goals Addressed              This Visit's Progress     Patient Stated   .  "Can you call my [facility] navigator?  I don't think he's doing his job." (pt-stated)        CARE PLAN ENTRY (see longitudinal plan of care for additional care plan information)  Current Barriers:  . Care Coordination needs related to securing electric scooter and in network oral surgeons in a patient with COPD, PAF, HF, HTN and morbid obesity.- patient called this CCM RN to inform that patient received call  from [facility] today advising patient she has been approved for the  [employer] urinary collection system and it will be shipped to patient's home address in the next 3-5 days, patient  expressed appreciation to this CCM RN and the clinic providers for their  assistance in securing the [place] for her  Nurse Case Manager Clinical Goal(s):  [name] Over the next 30- 90 days, patient will work with [place] and the CCM and clinic team to secure electric scooter and in network oral surgeons and [place] urinary collection system  Interventions:  . Inter-disciplinary care team collaboration (see longitudinal plan of care) . Shared celebration with patient for the approval for the [facility] urinary collection system . Messaged patient's clinic provider of the approval for the [place] . Requested patient notify this CCM RN when the [place] arrives at her home address.    Patient Self Care Activities:  . Patient verbalizes understanding of plan to work with [facility] navigator and CCM RN to address her concerns related to directory of in network oral surgeons, securing an Transport planner and assisting with securing the [employer] for home use . Unable to independently secure in network providers and DME  Please see past updates related to this goal by clicking on the "Past Updates" button in the selected goal        Other   .  "I need a scooter to help me get around"        Current Barriers:  [name] Knowledge Barriers related to resources and support available to address needs related to Inability to perform ADL's independently; need for DME.- returned call to patient to follow up on voice message left for this CCM RN yesterday regarding reason requested information related to electric scooter has not been faxed back to [employer] and [employer]. Advised patient that paperwork was placed in Dr [employer] mailbox for signature on 10/18 along with message in Epic asking Dr Heber [place] to  complete paperwork and place in this CCM RN's mail box, but it has not yet been signed because Dr Heber [place] has not been in the clinic.  Case Manager Clinical Goal(s):  [name] Over the next 30-90  days, patient will work with BSW to address needs related to Inability to perform ADL's independently, need for DME.   . Over the next 30 -90 days, BSW will collaborate with RN Care Manager to address care management and care coordination needs  Interventions:  . Successfully received faxed package of information from [facility] and [employer]; will request Dr Heber [place] to sign the OT seating evaluation that was completed by Luetta Nutting Ward OTR/L, BCPR, ATP/SMS on 12/01/19 . 01/19/20- Placed order form for electric scooter and OT evaluation in Dr. Jodene Nam clinic mailbox for completion. . Faxed clinic note of 10/23/19 to Elby Showers, [employer] at [phone] .  Updated patient on status of electric scooter preauthorization process . 10/19 at 12:35 pm - Received voice mail from [employer] with [employer] and [employer] with patient also on the phone requesting return of signed order for scooter, signed OT seating evaluation and letter of medical necessity. Will message provider about need for items to be faxed to the scooter company ASAP.  [name] 10/20- After consulting with clinic nursing director Yvonna Alanis, sent e-mail to Dr Heber [place] requesting completion of paperwork and offering to hand deliver the paperwork to him since he is not in the clinic this week. Advised patient of these actions by CCM RN. . 01/22/20- Received signed paper work via secure e-mail from [place] at the [place].Securely faxed written electric scooter order and signed electric scooter order and signed OT seating evaluation to [employer] at [phone] and received fax verification . 01/22/20 Securely e-mailed Elby Showers at [facility] and [employer] with request  to notify this CCM RN when she receives faxed paperwork . 01/22/20 Notified patient via phone that completed paperwork has been successfully faxed to [facility] and [employer]   Patient Self Care Activities:  . Attends all scheduled provider appointments . Calls provider office for new concerns or questions . Receives Transport planner  Please see past updates related to this goal by clicking on the "Past Updates" button in the selected goal          Plan:   The care management team will reach out to the patient again over the next 30-60 days.    Kelli Churn RN, CCM, [facility] RN Care Manager [phone]

## 2020-01-23 ENCOUNTER — Ambulatory Visit: Payer: Medicare Other | Admitting: *Deleted

## 2020-01-23 ENCOUNTER — Other Ambulatory Visit: Payer: Self-pay | Admitting: *Deleted

## 2020-01-23 DIAGNOSIS — I5032 Chronic diastolic (congestive) heart failure: Secondary | ICD-10-CM

## 2020-01-23 DIAGNOSIS — I10 Essential (primary) hypertension: Secondary | ICD-10-CM

## 2020-01-23 DIAGNOSIS — I480 Paroxysmal atrial fibrillation: Secondary | ICD-10-CM

## 2020-01-23 DIAGNOSIS — I739 Peripheral vascular disease, unspecified: Secondary | ICD-10-CM

## 2020-01-23 DIAGNOSIS — R32 Unspecified urinary incontinence: Secondary | ICD-10-CM

## 2020-01-23 DIAGNOSIS — J449 Chronic obstructive pulmonary disease, unspecified: Secondary | ICD-10-CM

## 2020-01-23 DIAGNOSIS — R1319 Other dysphagia: Secondary | ICD-10-CM

## 2020-01-23 MED ORDER — RIVAROXABAN 20 MG PO TABS: 20.0000 mg | ORAL_TABLET | ORAL | 3 refills | Status: DC

## 2020-01-23 NOTE — Telephone Encounter (Signed)
-----   Message from Barrington Ellison, RN sent at 01/23/2020 12:26 PM EDT ----- Regarding: FW: Patient needs Xarelto refills ASAP Janese Banks told me you are working triage today so forwarding this message to you. Thanks, Marcie Bal ----- Message ----- From: Barrington Ellison, RN Sent: 01/23/2020   9:48 AM EDT To: Imp Triage Nurse Pool Subject: Patient needs Xarelto refills ASAP             Happy Friday, I was speaking to Ms Milstein this morning about her DME I am owrking on with her and she told me she was just abot to call the clinic because she is completely out of Xarelto and asked if I would help her get the med refilled.  What provider should I contact about this? Thanks Marcie Bal

## 2020-01-23 NOTE — Chronic Care Management (AMB) (Signed)
Chronic Care Management   Follow Up Note   01/23/2020 Name: LYNLEIGH KOVACK [name]: [id number] DOB: 1950/09/11  Referred by: Andrew Au, MD Reason for referral : Chronic Care Management (HTN, Asthma, OSA, Seizures, Anxiety, Depression)   SHAAKIRA BORRERO is a 69 y.o. year old female who is a primary care patient of [name], [name], MD. The CCM team was consulted for assistance with chronic disease management and care coordination needs.    Review of patient status, including review of consultants reports, relevant laboratory and other test results, and collaboration with appropriate care team members and the patient's provider was performed as part of comprehensive patient evaluation and provision of chronic care management services.    SDOH (Social Determinants of Health) assessments performed: No See Care Plan activities for detailed interventions related to [facility])     Outpatient Encounter Medications as of 01/23/2020  Medication Sig Note  . albuterol (PROAIR HFA) 108 (90 Base) MCG/ACT inhaler Inhale 2 puffs into the lungs every 6 (six) hours as needed for wheezing or shortness of breath.   [name] albuterol (PROVENTIL) (2.5 MG/3ML) 0.083% nebulizer solution TAKE 3 MLS(1 AMPULE) BY NEBULIZATION EVERY 4 HOURS AS NEEDED FOR WHHEZING OR SHORTNESS OF BREATH (Patient taking differently: Take 2.5 mg by nebulization every 4 (four) hours as needed for wheezing or shortness of breath. ) 06/24/2019: Use once every two weeks  . aspirin EC 81 MG tablet Take 81 mg by mouth daily.   [name] atorvastatin (LIPITOR) 40 MG tablet Take 1 tablet (40 mg total) by mouth daily.   . benazepril (LOTENSIN) 40 MG tablet Take 1 tablet (40 mg total) by mouth daily.   [name] esomeprazole (NEXIUM) 40 MG capsule Take 1 capsule (40 mg total) by mouth daily as needed (for heartburn or indigestion).   . Fluticasone-Salmeterol (ADVAIR DISKUS) 250-50 MCG/DOSE AEPB Inhale 1 puff into the lungs 2 (two) times daily. Rinse mouth after each use   .  gabapentin (NEURONTIN) 400 MG capsule Take 1 capsule (400 mg total) by mouth 2 (two) times daily.   . hydrALAZINE (APRESOLINE) 25 MG tablet Take 1 tablet (25 mg total) by mouth 3 (three) times daily.   . metoprolol succinate (TOPROL-XL) 100 MG 24 hr tablet TAKE 1 TABLET(100 MG) BY MOUTH DAILY WITH OR IMMEDIATELY FOLLOWING A MEAL   . mirabegron ER (MYRBETRIQ) 50 MG TB24 tablet Take 50 mg by mouth daily. 11/20/2019: Patient states she received 30 day sample from urologist on 11/20/19  . oxyCODONE-acetaminophen (PERCOCET/ROXICET) 5-325 MG tablet Take 1 tablet by mouth daily as needed for severe pain. 06/24/2019: Takes every 6 hours round the clock  . potassium chloride (KLOR-CON) 10 MEQ tablet TAKE 2 TABLETS(20 MEQ) BY MOUTH TWICE DAILY (Patient taking differently: Take 20 mEq by mouth 2 (two) times daily. )   . rivaroxaban (XARELTO) 20 MG TABS tablet Take 1 tablet (20 mg total) by mouth every morning.   . tiotropium (SPIRIVA HANDIHALER) 18 MCG inhalation capsule PLACE 1 CAPSULE INTO INHALER AND INHALE DAILY   . torsemide (DEMADEX) 20 MG tablet Take 2 tablets (40 mg total) by mouth daily.    No facility-administered encounter medications on file as of 01/23/2020.     Objective:  Wt Readings from Last 3 Encounters:  12/25/19 (!) 374 lb 3.2 oz (169.7 kg)  11/10/19 (!) 376 lb 9.6 oz (170.8 kg)  10/24/19 (!) 378 lb 12.8 oz (171.8 kg)   BP Readings from Last 3 Encounters:  12/25/19 112/80  11/10/19 114/67  10/24/19 120/82    Goals Addressed              This Visit's Progress     Patient Stated   .  "They want to put a balloon down my throat, I don't understand what for. Please call me" (pt-stated)        CARE PLAN ENTRY (see longitudinal plan of care for additional care plan information)  Current Barriers:  [name] Knowledge Deficits related to esophageal dilation procedure- patient left message for this CCM RN with request to call her, she had a barium swallow done  10/20 with the following  results: IMPRESSION: . 1. Esophageal dysmotility.  Moderate esophageal stasis. . 2. Smooth strictured narrowing involving the distal esophagus near . the GE junction. No obvious mass. The 13 mm barium pill would not . pass into the stomach  Nurse Case Manager Clinical Goal(s):  [name] Over the next 7-14 days, patient will demonstrate understanding of rationale for esophageal dilation as evidenced by voicing understanding of why there procedure is done, what the expected result of the procedure is, what happens before during and after the procedure, what care is needed at home post procedure,  post procedure problems requiring MD notification , and post procedure treatment   Interventions:  . Inter-disciplinary care team collaboration (see longitudinal plan of care) . Provided education to patient via reading patient the [facility] education handout "esophageal dilation; and offering to e-mail it to her  . Encouraged patient to write down questions or concerns to discuss her gastroenterologist prior to the procedure   Patient Self Care Activities:  . Patient verbalizes understanding of plan to speak with gastroenterologist to address patient's questions and/or concerns related to the procedure . Unable to independently understand esophageal dilation procedure  Initial goal documentation         Plan:   The care management team will reach out to the patient again over the next 7-14 days.    Kelli Churn RN, CCM, [facility] RN Care Manager [phone]

## 2020-01-23 NOTE — Chronic Care Management (AMB) (Signed)
Chronic Care Management   Follow Up Note   01/23/2020 Name: Sharon Vasquez [name]: [id number] DOB: 1950/11/08  Referred by: Andrew Au, MD Reason for referral : Chronic Care Management (COPD, HF, HTN, PAF, hx of PE, OSA)   Sharon Vasquez is a 69 y.o. year old female who is a primary care patient of [name], [name], MD. The CCM team was consulted for assistance with chronic disease management and care coordination needs.    Review of patient status, including review of consultants reports, relevant laboratory and other test results, and collaboration with appropriate care team members and the patient's provider was performed as part of comprehensive patient evaluation and provision of chronic care management services.    SDOH (Social Determinants of Health) assessments performed: No See Care Plan activities for detailed interventions related to [facility])     Outpatient Encounter Medications as of 01/23/2020  Medication Sig Note  . albuterol (PROAIR HFA) 108 (90 Base) MCG/ACT inhaler Inhale 2 puffs into the lungs every 6 (six) hours as needed for wheezing or shortness of breath.   [name] albuterol (PROVENTIL) (2.5 MG/3ML) 0.083% nebulizer solution TAKE 3 MLS(1 AMPULE) BY NEBULIZATION EVERY 4 HOURS AS NEEDED FOR WHHEZING OR SHORTNESS OF BREATH (Patient taking differently: Take 2.5 mg by nebulization every 4 (four) hours as needed for wheezing or shortness of breath. ) 06/24/2019: Use once every two weeks  . aspirin EC 81 MG tablet Take 81 mg by mouth daily.   [name] atorvastatin (LIPITOR) 40 MG tablet Take 1 tablet (40 mg total) by mouth daily.   . benazepril (LOTENSIN) 40 MG tablet Take 1 tablet (40 mg total) by mouth daily.   [name] esomeprazole (NEXIUM) 40 MG capsule Take 1 capsule (40 mg total) by mouth daily as needed (for heartburn or indigestion).   . Fluticasone-Salmeterol (ADVAIR DISKUS) 250-50 MCG/DOSE AEPB Inhale 1 puff into the lungs 2 (two) times daily. Rinse mouth after each use   . gabapentin  (NEURONTIN) 400 MG capsule Take 1 capsule (400 mg total) by mouth 2 (two) times daily.   . hydrALAZINE (APRESOLINE) 25 MG tablet Take 1 tablet (25 mg total) by mouth 3 (three) times daily.   . metoprolol succinate (TOPROL-XL) 100 MG 24 hr tablet TAKE 1 TABLET(100 MG) BY MOUTH DAILY WITH OR IMMEDIATELY FOLLOWING A MEAL   . mirabegron ER (MYRBETRIQ) 50 MG TB24 tablet Take 50 mg by mouth daily. 11/20/2019: Patient states she received 30 day sample from urologist on 11/20/19  . oxyCODONE-acetaminophen (PERCOCET/ROXICET) 5-325 MG tablet Take 1 tablet by mouth daily as needed for severe pain. 06/24/2019: Takes every 6 hours round the clock  . potassium chloride (KLOR-CON) 10 MEQ tablet TAKE 2 TABLETS(20 MEQ) BY MOUTH TWICE DAILY (Patient taking differently: Take 20 mEq by mouth 2 (two) times daily. )   . rivaroxaban (XARELTO) 20 MG TABS tablet Take 1 tablet (20 mg total) by mouth every morning.   . tiotropium (SPIRIVA HANDIHALER) 18 MCG inhalation capsule PLACE 1 CAPSULE INTO INHALER AND INHALE DAILY   . torsemide (DEMADEX) 20 MG tablet Take 2 tablets (40 mg total) by mouth daily.    No facility-administered encounter medications on file as of 01/23/2020.     Objective:  BP Readings from Last 3 Encounters:  12/25/19 112/80  11/10/19 114/67  10/24/19 120/82    Wt Readings from Last 3 Encounters:  12/25/19 (!) 374 lb 3.2 oz (169.7 kg)  11/10/19 (!) 376 lb 9.6 oz (170.8 kg)  10/24/19 (!) 378  lb 12.8 oz (171.8 kg)    Goals Addressed              This Visit's Progress     Patient Stated   .  'I am completely out of my Xarelto. Can you help get a refill ASAP?" (pt-stated)        CARE PLAN ENTRY (see longitudinal plan of care for additional care plan information)  Current Barriers:  . Requesting Xarelto refill ASAP  Nurse Case Manager Clinical Goal(s):  [name] Over the next 1 days, patient will verbalize understanding of plan for CCM RN to contact clinic provider regarding need for Xarelto  refills ASAP  Interventions:  . Inter-disciplinary care team collaboration (see longitudinal plan of care) . Urgent message sent to Howell Rucks clinic triage nurse regarding patient's urgent request for Xarelto refill . Will also message Red Team regarding patient's request for Xarelto  Patient Self Care Activities:  . Patient verbalizes understanding of plan for CCM RN to contact clinic provider regarding need for Xarelto refills ASAP . Self administers medications as prescribed . Attends all scheduled provider appointments . Calls pharmacy for medication refills prior to exhausting medication supply . Calls provider office for new concerns or questions . Patient does not call for medication refill  in timely manner  Initial goal documentation       Other   .  "I need a scooter to help me get around"        Current Barriers:  [name] Knowledge Barriers related to resources and support available to address needs related to Inability to perform ADL's independently; need for DME.- returned call to patient to follow up on voice message left for this CCM RN yesterday regarding reason requested information related to electric scooter has not been faxed back to [employer]. Advised patient that paperwork was placed in Dr [employer] mailbox for signature on 10/18 along with message in Epic asking Dr Heber [place] to complete paperwork and place in this CCM RN's mail box, but it has not yet been signed because Dr Heber [place] has not been in the clinic.  Case Manager Clinical Goal(s):  [name] Over the next 30-90  days, patient will work with BSW to address needs related to Inability to perform ADL's independently, need for DME.   . Over the next 30 -90 days, BSW will collaborate with RN Care Manager to address care management and care coordination needs  Interventions:  . Successfully received faxed package of information from [facility] and [employer]; will request Dr Heber [place] to sign the OT  seating evaluation that was completed by Luetta Nutting Ward OTR/L, BCPR, ATP/SMS on 12/01/19 . 01/19/20- Placed order form for electric scooter and OT evaluation in Dr. Jodene Nam clinic mailbox for completion. . Faxed clinic note of 10/23/19 to Elby Showers, [employer] at [phone] . Updated patient on status of electric scooter preauthorization process . 10/19 at 12:35 pm - Received voice mail from [employer] with [employer] with patient also on the phone requesting return of signed order for scooter, signed OT seating evaluation and letter of medical necessity. Will message provider about need for items to be faxed to the scooter company ASAP.  [name] 10/20- After consulting with clinic nursing director Yvonna Alanis, sent e-mail to Dr Heber [place] requesting completion of paperwork and offering to hand deliver the paperwork to him since he is not in the clinic this week. Advised patient of these actions by CCM RN. . 01/22/20- Received signed  paper work via Research scientist (medical) from [employer] [facility] [employer] faxed written electric scooter order and signed electric scooter order and signed OT seating evaluation to [employer] at [phone] and received fax verification . Securely e-mailed Elby Showers at [facility] [employer] with request to notify this CCM RN when she receives faxed paperwork . Notified patient via phone that completed paperwork has been successfully faxed to [employer] . 01/23/20 Received reply e-mail from Elby Showers on 01/21/21 at 3:23 pm that she received the requested signed scooter paperwork from this CCM RN and will be submitting to patient's insurance on 01/22/20 . 01/23/20 Notified patient via phone that signed paperwork was submitted to her health insurance plan by Elby Showers [facility] and [employer]) yesterday   Patient Self Care Activities:  . Attends all  scheduled provider appointments . Calls provider office for new concerns or questions . Receives Transport planner  Please see past updates related to this goal by clicking on the "Past Updates" button in the selected goal          Plan:   The care management team will reach out to the patient again over the next 30 days.   Kelli Churn RN, CCM, [facility] RN Care Manager [phone]

## 2020-01-26 ENCOUNTER — Ambulatory Visit: Payer: Medicare Other | Admitting: *Deleted

## 2020-01-26 ENCOUNTER — Other Ambulatory Visit: Payer: Self-pay | Admitting: Cardiovascular Disease

## 2020-01-26 ENCOUNTER — Other Ambulatory Visit: Payer: Self-pay | Admitting: Gastroenterology

## 2020-01-26 DIAGNOSIS — I5033 Acute on chronic diastolic (congestive) heart failure: Secondary | ICD-10-CM

## 2020-01-26 NOTE — Chronic Care Management (AMB) (Signed)
Chronic Care Management   Follow Up Note   01/26/2020 Name: Sharon Vasquez [name]: [id number] DOB: 1950/09/16  Referred by: Andrew Au, MD Reason for referral : Chronic Care Management (HTN, Asthma, OSA, Seizures, Anxiety, Depression, Urinary Incontinence)   Sharon Vasquez is a 69 y.o. year old female who is a primary care patient of [name], [name], MD. The CCM team was consulted for assistance with chronic disease management and care coordination needs.    Review of patient status, including review of consultants reports, relevant laboratory and other test results, and collaboration with appropriate care team members and the patient's provider was performed as part of comprehensive patient evaluation and provision of chronic care management services.    SDOH (Social Determinants of Health) assessments performed: No See Care Plan activities for detailed interventions related to [facility])     Outpatient Encounter Medications as of 01/26/2020  Medication Sig Note  . albuterol (PROAIR HFA) 108 (90 Base) MCG/ACT inhaler Inhale 2 puffs into the lungs every 6 (six) hours as needed for wheezing or shortness of breath.   [name] albuterol (PROVENTIL) (2.5 MG/3ML) 0.083% nebulizer solution TAKE 3 MLS(1 AMPULE) BY NEBULIZATION EVERY 4 HOURS AS NEEDED FOR WHHEZING OR SHORTNESS OF BREATH (Patient taking differently: Take 2.5 mg by nebulization every 4 (four) hours as needed for wheezing or shortness of breath. ) 06/24/2019: Use once every two weeks  . aspirin EC 81 MG tablet Take 81 mg by mouth daily.   [name] atorvastatin (LIPITOR) 40 MG tablet Take 1 tablet (40 mg total) by mouth daily.   . benazepril (LOTENSIN) 40 MG tablet Take 1 tablet (40 mg total) by mouth daily.   [name] esomeprazole (NEXIUM) 40 MG capsule Take 1 capsule (40 mg total) by mouth daily as needed (for heartburn or indigestion).   . Fluticasone-Salmeterol (ADVAIR DISKUS) 250-50 MCG/DOSE AEPB Inhale 1 puff into the lungs 2 (two) times daily. Rinse  mouth after each use   . gabapentin (NEURONTIN) 400 MG capsule Take 1 capsule (400 mg total) by mouth 2 (two) times daily.   . hydrALAZINE (APRESOLINE) 25 MG tablet Take 1 tablet (25 mg total) by mouth 3 (three) times daily.   . metoprolol succinate (TOPROL-XL) 100 MG 24 hr tablet TAKE 1 TABLET(100 MG) BY MOUTH DAILY WITH OR IMMEDIATELY FOLLOWING A MEAL   . mirabegron ER (MYRBETRIQ) 50 MG TB24 tablet Take 50 mg by mouth daily. 11/20/2019: Patient states she received 30 day sample from urologist on 11/20/19  . oxyCODONE-acetaminophen (PERCOCET/ROXICET) 5-325 MG tablet Take 1 tablet by mouth daily as needed for severe pain. 06/24/2019: Takes every 6 hours round the clock  . potassium chloride (KLOR-CON) 10 MEQ tablet TAKE 2 TABLETS(20 MEQ) BY MOUTH TWICE DAILY (Patient taking differently: Take 20 mEq by mouth 2 (two) times daily. )   . rivaroxaban (XARELTO) 20 MG TABS tablet Take 1 tablet (20 mg total) by mouth every morning.   . tiotropium (SPIRIVA HANDIHALER) 18 MCG inhalation capsule PLACE 1 CAPSULE INTO INHALER AND INHALE DAILY   . torsemide (DEMADEX) 20 MG tablet Take 2 tablets (40 mg total) by mouth daily.    No facility-administered encounter medications on file as of 01/26/2020.     Objective:  Wt Readings from Last 3 Encounters:  12/25/19 (!) 374 lb 3.2 oz (169.7 kg)  11/10/19 (!) 376 lb 9.6 oz (170.8 kg)  10/24/19 (!) 378 lb 12.8 oz (171.8 kg)   BP Readings from Last 3 Encounters:  12/25/19 112/80  11/10/19  114/67  10/24/19 120/82    Goals Addressed              This Visit's Progress     Patient Stated   .  COMPLETED: "Can you call my [facility] navigator?  I don't think he's doing his job." (pt-stated)        CARE PLAN ENTRY (see longitudinal plan of care for additional care plan information)  Current Barriers:  . Care Coordination needs related to securing electric scooter and in network oral surgeons in a patient with COPD, PAF, HF, HTN and morbid obesity.- patient called  this CCM RN to inform that she received the [place] female urinary collection system from [facility] this morning and she is thrilled and very appreciative for the help provided by this CCM Rn and the clinic providers in assisting her with getting the system  Nurse Case Manager Clinical Goal(s):  [name] Over the next 30- 90 days, patient will work with [place] and the CCM and clinic team to secure electric scooter and in network oral surgeons and [place] urinary collection system  Interventions:  . Inter-disciplinary care team collaboration (see longitudinal plan of care) . Shared celebration with patient for receiving the [employer] urinary collection system . Messaged patient's clinic provider of patient receiving the [employer] and thanked Dr [name] for his help with the documentation and order    Patient Self Care Activities:  . Patient verbalizes understanding of plan to work with [facility] navigator and CCM RN to address her concerns related to directory of in network oral surgeons, securing an Transport planner and assisting with securing the PureWick for home use . Unable to independently secure in network providers and DME  Please see past updates related to this goal by clicking on the "Past Updates" button in the selected goal          Plan:   The care management team will reach out to the patient again over the next 30-60 days.    Kelli Churn RN, CCM, [facility] RN Care Manager [phone]

## 2020-01-26 NOTE — Progress Notes (Signed)
Internal Medicine Clinic Resident  I have personally reviewed this encounter including the documentation in this note and/or discussed this patient with the care management provider. I will address any urgent items identified by the care management provider and will communicate my actions to the patient's PCP. I have reviewed the patient's CCM visit with my supervising attending, Dr Vincent.  Kellsey Sansone, MD 01/26/2020    

## 2020-01-26 NOTE — Telephone Encounter (Signed)
Need refill on fluid pill  ;pt contact [place], [place]

## 2020-01-26 NOTE — Progress Notes (Signed)
Internal Medicine Clinic Attending  CCM services provided by the care management provider and their documentation were discussed with Dr. Marianna Payment. We reviewed the pertinent findings, urgent action items addressed by the resident and non-urgent items to be addressed by the PCP.  I agree with the assessment, diagnosis, and plan of care documented in the CCM and resident's note.  Axel Filler, MD 01/26/2020

## 2020-01-26 NOTE — Progress Notes (Signed)
Internal Medicine Clinic Resident  I have personally reviewed this encounter including the documentation in this note and/or discussed this patient with the care management provider. I will address any urgent items identified by the care management provider and will communicate my actions to the patient's PCP. I have reviewed the patient's CCM visit with my supervising attending, Dr Vincent.  Tyrese Capriotti, MD 01/26/2020    

## 2020-01-28 ENCOUNTER — Ambulatory Visit: Payer: Medicare Other | Admitting: *Deleted

## 2020-01-28 DIAGNOSIS — I5032 Chronic diastolic (congestive) heart failure: Secondary | ICD-10-CM

## 2020-01-28 DIAGNOSIS — I480 Paroxysmal atrial fibrillation: Secondary | ICD-10-CM

## 2020-01-28 DIAGNOSIS — I10 Essential (primary) hypertension: Secondary | ICD-10-CM

## 2020-01-28 NOTE — Progress Notes (Signed)
Internal Medicine Clinic Resident  I have personally reviewed this encounter including the documentation in this note and/or discussed this patient with the care management provider. I will address any urgent items identified by the care management provider and will communicate my actions to the patient's PCP. I have reviewed the patient's CCM visit with my supervising attending, Dr Vincent.  Ilian Wessell, MD  [facility] PGY-2 01/28/2020    

## 2020-01-28 NOTE — Chronic Care Management (AMB) (Signed)
Chronic Care Management   Follow Up Note   01/28/2020 Name: SHERAE SANTINO [name]: [id number] DOB: 26-Mar-1951  Referred by: Andrew Au, MD Reason for referral : Chronic Care Management (HTN, Asthma, OSA, Seizures, Anxiety, Depression, Urinary Incontinence)   LACHELLE RISSLER is a 69 y.o. year old female who is a primary care patient of [name], [name], MD. The CCM team was consulted for assistance with chronic disease management and care coordination needs.    Review of patient status, including review of consultants reports, relevant laboratory and other test results, and collaboration with appropriate care team members and the patient's provider was performed as part of comprehensive patient evaluation and provision of chronic care management services.    SDOH (Social Determinants of Health) assessments performed: No See Care Plan activities for detailed interventions related to [facility])     Outpatient Encounter Medications as of 01/28/2020  Medication Sig Note  . albuterol (PROAIR HFA) 108 (90 Base) MCG/ACT inhaler Inhale 2 puffs into the lungs every 6 (six) hours as needed for wheezing or shortness of breath.   [name] albuterol (PROVENTIL) (2.5 MG/3ML) 0.083% nebulizer solution TAKE 3 MLS(1 AMPULE) BY NEBULIZATION EVERY 4 HOURS AS NEEDED FOR WHHEZING OR SHORTNESS OF BREATH (Patient taking differently: Take 2.5 mg by nebulization every 4 (four) hours as needed for wheezing or shortness of breath. ) 06/24/2019: Use once every two weeks  . aspirin EC 81 MG tablet Take 81 mg by mouth daily.   [name] atorvastatin (LIPITOR) 40 MG tablet Take 1 tablet (40 mg total) by mouth daily.   . benazepril (LOTENSIN) 40 MG tablet Take 1 tablet (40 mg total) by mouth daily.   [name] esomeprazole (NEXIUM) 40 MG capsule Take 1 capsule (40 mg total) by mouth daily as needed (for heartburn or indigestion).   . Fluticasone-Salmeterol (ADVAIR DISKUS) 250-50 MCG/DOSE AEPB Inhale 1 puff into the lungs 2 (two) times daily. Rinse  mouth after each use   . gabapentin (NEURONTIN) 400 MG capsule Take 1 capsule (400 mg total) by mouth 2 (two) times daily.   . hydrALAZINE (APRESOLINE) 25 MG tablet Take 1 tablet (25 mg total) by mouth 3 (three) times daily.   . metoprolol succinate (TOPROL-XL) 100 MG 24 hr tablet TAKE 1 TABLET(100 MG) BY MOUTH DAILY WITH OR IMMEDIATELY FOLLOWING A MEAL   . mirabegron ER (MYRBETRIQ) 50 MG TB24 tablet Take 50 mg by mouth daily. 11/20/2019: Patient states she received 30 day sample from urologist on 11/20/19  . oxyCODONE-acetaminophen (PERCOCET/ROXICET) 5-325 MG tablet Take 1 tablet by mouth daily as needed for severe pain. 06/24/2019: Takes every 6 hours round the clock  . potassium chloride (KLOR-CON) 10 MEQ tablet TAKE 2 TABLETS(20 MEQ) BY MOUTH TWICE DAILY (Patient taking differently: Take 20 mEq by mouth 2 (two) times daily. )   . rivaroxaban (XARELTO) 20 MG TABS tablet Take 1 tablet (20 mg total) by mouth every morning.   . tiotropium (SPIRIVA HANDIHALER) 18 MCG inhalation capsule PLACE 1 CAPSULE INTO INHALER AND INHALE DAILY   . torsemide (DEMADEX) 20 MG tablet TAKE 2 TABLETS BY MOUTH DAILY    No facility-administered encounter medications on file as of 01/28/2020.     Objective:   Goals Addressed            This Visit's Progress   . "I need a scooter to help me get around"       Current Barriers:  [name] Knowledge Barriers related to resources and support available to address  needs related to Inability to perform ADL's independently; need for DME.- received call from patient's [place] stating it is his understanding that patient's clinic provider needs to "sign off' on the electric scooter order Case Manager Clinical Goal(s):  [name] Over the next 30-90  days, patient will work with BSW to address needs related to Inability to perform ADL's independently, need for DME.   . Over the next 30 -90 days, BSW will collaborate with RN Care Manager to address  care management and care coordination needs  Interventions:  . Successfully received faxed package of information from [facility] and [employer]; will request Dr Heber [place] to sign the OT seating evaluation that was completed by Luetta Nutting [place] OTR/L, BCPR, ATP/SMS on 12/01/19 . 01/19/20- Placed order form for electric scooter and OT evaluation in Dr. Jodene Nam clinic mailbox for completion. . Faxed clinic note of 10/23/19 to Elby Showers, [employer] at [phone] . Updated patient on status of electric scooter preauthorization process . 10/19 at 12:35 pm - Received voice mail from [employer] with [employer] and [employer] with patient also on the phone requesting return of signed order for scooter, signed OT seating evaluation and letter of medical necessity. Will message provider about need for items to be faxed to the scooter company ASAP.  [name] 10/20- After consulting with clinic nursing director Yvonna Alanis, sent e-mail to Dr Heber [place] requesting completion of paperwork and offering to hand deliver the paperwork to him since he is not in the clinic this week. Advised patient of these actions by CCM RN. . 01/22/20- Received signed paper work via secure e-mail from [place] at the [place].Securely faxed written electric scooter order and signed electric scooter order and signed OT seating evaluation to [employer] and [employer] at [phone] and received fax verification . Securely e-mailed Elby Showers at [facility] and [employer] with request to notify this CCM RN when she receives faxed paperwork . Notified patient via phone that completed paperwork has been successfully faxed to [employer] and [employer] . 01/23/20 Received reply e-mail from Elby Showers on 01/22/20 at 3:23 pm that she received the requested signed scooter paperwork from this CCM RN and will be submitting to patient's insurance on 01/22/20 . 01/23/20 Notified  patient via phone that signed paperwork was submitted to her health insurance plan by Elby Showers [facility] and [employer]) yesterday . 01/28/20- Notified [name] that all required paperwork was successfully faxed to Elby Showers at [facility] and [employer] with verification via successful fax transmittal notice and e-mail form Megan on 01/22/20. Per Darryl's request, provided Megan's contact information   Patient Self Care Activities:  . Attends all scheduled provider appointments . Calls provider office for new concerns or questions . Receives Transport planner  Please see past updates related to this goal by clicking on the "Past Updates" button in the selected goal          Plan:   The care management team will reach out to the patient again over the next 30 days.    Kelli Churn RN, CCM, [facility] RN Care Manager [phone]

## 2020-01-28 NOTE — Progress Notes (Signed)
Internal Medicine Clinic Attending  CCM services provided by the care management provider and their documentation were discussed with Dr. Aslam. We reviewed the pertinent findings, urgent action items addressed by the resident and non-urgent items to be addressed by the PCP.  I agree with the assessment, diagnosis, and plan of care documented in the CCM and resident's note.  Jasper Ruminski Thomas Paulmichael Schreck, MD 01/28/2020  

## 2020-01-30 ENCOUNTER — Telehealth: Payer: Self-pay | Admitting: Student

## 2020-01-30 NOTE — Telephone Encounter (Signed)
RTC, VM obtained and recorded VM message states to leave a message with a number other than number called and listed in chart.  No message was left. SChaplin, RN,BSN

## 2020-01-30 NOTE — Telephone Encounter (Signed)
Pt requesting a call back.  Pt states she has been having problems urinating and has some questions about her medication.

## 2020-02-02 ENCOUNTER — Other Ambulatory Visit: Payer: Self-pay | Admitting: Internal Medicine

## 2020-02-02 ENCOUNTER — Other Ambulatory Visit: Payer: Self-pay | Admitting: Pulmonary Disease

## 2020-02-02 ENCOUNTER — Ambulatory Visit: Payer: Medicare Other | Admitting: *Deleted

## 2020-02-02 DIAGNOSIS — I480 Paroxysmal atrial fibrillation: Secondary | ICD-10-CM

## 2020-02-02 DIAGNOSIS — R32 Unspecified urinary incontinence: Secondary | ICD-10-CM

## 2020-02-02 DIAGNOSIS — I10 Essential (primary) hypertension: Secondary | ICD-10-CM

## 2020-02-02 DIAGNOSIS — I5033 Acute on chronic diastolic (congestive) heart failure: Secondary | ICD-10-CM

## 2020-02-02 DIAGNOSIS — I5032 Chronic diastolic (congestive) heart failure: Secondary | ICD-10-CM

## 2020-02-02 NOTE — Progress Notes (Signed)
Internal Medicine Clinic Attending  CCM services provided by the care management provider and their documentation were discussed with Dr. Marianna Payment. We reviewed the pertinent findings, urgent action items addressed by the resident and non-urgent items to be addressed by the PCP.  I agree with the assessment, diagnosis, and plan of care documented in the CCM and resident's note.  Axel Filler, MD 02/02/2020

## 2020-02-02 NOTE — Chronic Care Management (AMB) (Signed)
Chronic Care Management   Follow Up Note   02/02/2020 Name: Sharon Vasquez [name]: [id number] DOB: [birth year]  Referred by: Sharon Au, MD Reason for referral : No chief complaint on file.   [name] is a 69 y.o. year old female who is a primary care patient of [name], [name], MD. The CCM team was consulted for assistance with chronic disease management and care coordination needs.    Review of patient status, including review of consultants reports, relevant laboratory and other test results, and collaboration with appropriate care team members and the patient's provider was performed as part of comprehensive patient evaluation and provision of chronic care management services.    SDOH (Social Determinants of Health) assessments performed: No See Care Plan activities for detailed interventions related to [facility])     Outpatient Encounter Medications as of 02/02/2020  Medication Sig Note  . albuterol (PROAIR HFA) 108 (90 Base) MCG/ACT inhaler Inhale 2 puffs into the lungs every 6 (six) hours as needed for wheezing or shortness of breath.   [name] albuterol (PROVENTIL) (2.5 MG/3ML) 0.083% nebulizer solution TAKE 3 MLS(1 AMPULE) BY NEBULIZATION EVERY 4 HOURS AS NEEDED FOR WHHEZING OR SHORTNESS OF BREATH (Patient taking differently: Take 2.5 mg by nebulization every 4 (four) hours as needed for wheezing or shortness of breath. ) 06/24/2019: Use once every two weeks  . aspirin EC 81 MG tablet Take 81 mg by mouth daily.   [name] atorvastatin (LIPITOR) 40 MG tablet Take 1 tablet (40 mg total) by mouth daily.   . benazepril (LOTENSIN) 40 MG tablet Take 1 tablet (40 mg total) by mouth daily.   [name] esomeprazole (NEXIUM) 40 MG capsule Take 1 capsule (40 mg total) by mouth daily as needed (for heartburn or indigestion).   . Fluticasone-Salmeterol (ADVAIR DISKUS) 250-50 MCG/DOSE AEPB Inhale 1 puff into the lungs 2 (two) times daily. Rinse mouth after each use   . gabapentin (NEURONTIN) 400 MG capsule Take 1  capsule (400 mg total) by mouth 2 (two) times daily.   . hydrALAZINE (APRESOLINE) 25 MG tablet Take 1 tablet (25 mg total) by mouth 3 (three) times daily.   . metoprolol succinate (TOPROL-XL) 100 MG 24 hr tablet TAKE 1 TABLET(100 MG) BY MOUTH DAILY WITH OR IMMEDIATELY FOLLOWING A MEAL   . mirabegron ER (MYRBETRIQ) 50 MG TB24 tablet Take 50 mg by mouth daily. 11/20/2019: Patient states she received 30 day sample from urologist on 11/20/19  . oxyCODONE-acetaminophen (PERCOCET/ROXICET) 5-325 MG tablet Take 1 tablet by mouth daily as needed for severe pain. 06/24/2019: Takes every 6 hours round the clock  . potassium chloride (KLOR-CON) 10 MEQ tablet TAKE 2 TABLETS(20 MEQ) BY MOUTH TWICE DAILY (Patient taking differently: Take 20 mEq by mouth 2 (two) times daily. )   . rivaroxaban (XARELTO) 20 MG TABS tablet Take 1 tablet (20 mg total) by mouth every morning.   . tiotropium (SPIRIVA HANDIHALER) 18 MCG inhalation capsule PLACE 1 CAPSULE INTO INHALER AND INHALE DAILY   . torsemide (DEMADEX) 20 MG tablet TAKE 2 TABLETS BY MOUTH DAILY    No facility-administered encounter medications on file as of 02/02/2020.     Objective:  Wt Readings from Last 3 Encounters:  12/25/19 (!) 374 lb 3.2 oz (169.7 kg)  11/10/19 (!) 376 lb 9.6 oz (170.8 kg)  10/24/19 (!) 378 lb 12.8 oz (171.8 kg)   BP Readings from Last 3 Encounters:  12/25/19 112/80  11/10/19 114/67  10/24/19 120/82    Goals Addressed  This Visit's Progress   . "I need a scooter to help me get around"       Current Barriers:  [name] Knowledge Barriers related to resources and support available to address needs related to Inability to perform ADL's independently; need for DME.- returned call to patient in response to voice mail left earlier today  in which patient stated the paperwork for the electric scooter cannot be sent to [facility] until  Dr Sharon Vasquez signs off on 10/23/19 clinic note   Case Manager Clinical Goal(s):  [name] Over the next 30-90   days, patient will work with BSW to address needs related to Inability to perform ADL's independently, need for DME.   . Over the next 30 -90 days, BSW will collaborate with RN Care Manager to address care management and care coordination needs  Interventions:  . Successfully received faxed package of information from [facility] and [employer]; will request Dr Sharon [place] to sign the OT seating evaluation that was completed by Luetta Nutting Ward OTR/L, BCPR, ATP/SMS on 12/01/19 . 01/19/20- Placed order form for electric scooter and OT evaluation in Dr. Jodene Nam clinic mailbox for completion. . Faxed clinic note of 10/23/19 to Sharon Vasquez, [employer] at [phone] . Updated patient on status of electric scooter preauthorization process . 10/19 at 12:35 pm - Received voice mail from [employer] with [employer] and [employer] with patient also on the phone requesting return of signed order for scooter, signed OT seating evaluation and letter of medical necessity. Will message provider about need for items to be faxed to the scooter company ASAP.  [name] 10/20- After consulting with clinic nursing director Sharon Vasquez, sent e-mail to Dr Sharon [place] requesting completion of paperwork and offering to hand deliver the paperwork to him since he is not in the clinic this week. Advised patient of these actions by CCM RN. . 01/22/20- Received signed paper work via secure e-mail from [place] at the [place].Securely faxed written electric scooter order and signed electric scooter order and signed OT seating evaluation to [employer] and [employer] at [phone] and received fax verification . Securely e-mailed Sharon Vasquez at [facility] and [employer] with request to notify this CCM RN when she receives faxed paperwork . Notified patient via phone that completed paperwork has been successfully faxed to [employer] and [employer] . 01/23/20 Received reply  e-mail from Sharon Vasquez on 01/22/20 at 3:23 pm that she received the requested signed scooter paperwork from this CCM RN and will be submitting to patient's insurance on 01/22/20 . 01/23/20 Notified patient via phone that signed paperwork was submitted to her health insurance plan by Sharon Vasquez [facility] and [employer]) yesterday . 01/28/20- Notified [name] that all required paperwork was successfully faxed to Sharon Vasquez at [facility] and [employer] with verification via successful fax transmittal notice and e-mail form Megan on 01/22/20. Per Darryl's request, provided Megan's contact information . 02/02/20- spoke with [employer] navigator [name] and then secure e-mail to [employer] at [employer]  . 02/02/20 Collaborated with Dr. Heber Kinsey to request and secure signature on 10/23/19 clinic note when patient was seen for electric scooter evaluation . 02/02/20 Notified patient that paperwork for electric scooter has been submitted to [facility] . 02/02/20 Notified [place] Medicare  Navigator iva secure emia that paperwork for electric scooter has been submitted by Sharon Vasquez via e-mail   Patient Self Care Activities:  . Attends all  scheduled provider appointments . Calls provider office for new concerns or questions . Receives Transport planner  Please see past updates related to this goal by clicking on the "Past Updates" button in the selected goal          Plan:   The care management team will reach out to the patient again over the next 30-60 days.    Kelli Churn RN, CCM, [facility] RN Care Manager [phone]

## 2020-02-02 NOTE — Telephone Encounter (Signed)
Pt is requesting refiill on POTASSIUM CL MICRO 10MEQ ER TABS next ov 04/15/2019

## 2020-02-02 NOTE — Progress Notes (Signed)
Internal Medicine Clinic Resident  I have personally reviewed this encounter including the documentation in this note and/or discussed this patient with the care management provider. I will address any urgent items identified by the care management provider and will communicate my actions to the patient's PCP. I have reviewed the patient's CCM visit with my supervising attending, Dr Evette Doffing.  Marianna Payment, MD 02/02/2020

## 2020-02-03 DIAGNOSIS — G8929 Other chronic pain: Secondary | ICD-10-CM | POA: Diagnosis not present

## 2020-02-03 DIAGNOSIS — Z79899 Other long term (current) drug therapy: Secondary | ICD-10-CM | POA: Diagnosis not present

## 2020-02-03 DIAGNOSIS — M170 Bilateral primary osteoarthritis of knee: Secondary | ICD-10-CM | POA: Diagnosis not present

## 2020-02-03 DIAGNOSIS — M25561 Pain in right knee: Secondary | ICD-10-CM | POA: Diagnosis not present

## 2020-02-03 DIAGNOSIS — M25562 Pain in left knee: Secondary | ICD-10-CM | POA: Diagnosis not present

## 2020-02-03 NOTE — Telephone Encounter (Signed)
Defer to PCP

## 2020-02-03 NOTE — Progress Notes (Signed)
Internal Medicine Attestation: This CCM encounter has been discussed with resident physician, reviewed, and approved. Aleathia Purdy, MD  

## 2020-02-03 NOTE — Progress Notes (Signed)
Internal Medicine Clinic Resident  I have personally reviewed this encounter including the documentation in this note and/or discussed this patient with the care management provider. I will address any urgent items identified by the care management provider and will communicate my actions to the patient's PCP. I have reviewed the patient's CCM visit with my supervising attending, Dr Williams.  Maryela Tapper M Lexandra Rettke, MD 02/03/2020    

## 2020-02-04 ENCOUNTER — Other Ambulatory Visit: Payer: Self-pay | Admitting: Internal Medicine

## 2020-02-04 DIAGNOSIS — G894 Chronic pain syndrome: Secondary | ICD-10-CM

## 2020-02-05 ENCOUNTER — Ambulatory Visit: Payer: Medicare Other | Admitting: *Deleted

## 2020-02-05 ENCOUNTER — Other Ambulatory Visit: Payer: Self-pay | Admitting: Internal Medicine

## 2020-02-05 DIAGNOSIS — R32 Unspecified urinary incontinence: Secondary | ICD-10-CM

## 2020-02-05 DIAGNOSIS — I480 Paroxysmal atrial fibrillation: Secondary | ICD-10-CM

## 2020-02-05 DIAGNOSIS — I5032 Chronic diastolic (congestive) heart failure: Secondary | ICD-10-CM

## 2020-02-05 DIAGNOSIS — J449 Chronic obstructive pulmonary disease, unspecified: Secondary | ICD-10-CM

## 2020-02-05 DIAGNOSIS — I10 Essential (primary) hypertension: Secondary | ICD-10-CM

## 2020-02-05 NOTE — Chronic Care Management (AMB) (Signed)
Chronic Care Management   Follow Up Note   02/05/2020 Name: Sharon Vasquez [name]: [id number] DOB: [birth year]  Referred by: Andrew Au, MD Reason for referral : Chronic Care Management (HTN, Asthma, OSA, Seizures, Anxiety, Depression, Urinary Incontinence)   HALYNN Vasquez is a 69 y.o. year old female who is a primary care patient of [name], [name], MD. The CCM team was consulted for assistance with chronic disease management and care coordination needs.    Review of patient status, including review of consultants reports, relevant laboratory and other test results, and collaboration with appropriate care team members and the patient's provider was performed as part of comprehensive patient evaluation and provision of chronic care management services.    SDOH (Social Determinants of Health) assessments performed: No See Care Plan activities for detailed interventions related to [facility])     Outpatient Encounter Medications as of 02/05/2020  Medication Sig Note  . albuterol (PROAIR HFA) 108 (90 Base) MCG/ACT inhaler Inhale 2 puffs into the lungs every 6 (six) hours as needed for wheezing or shortness of breath.   Sharon Vasquez albuterol (PROVENTIL) (2.5 MG/3ML) 0.083% nebulizer solution TAKE 3 MLS(1 AMPULE) BY NEBULIZATION EVERY 4 HOURS AS NEEDED FOR WHHEZING OR SHORTNESS OF BREATH (Patient taking differently: Take 2.5 mg by nebulization every 4 (four) hours as needed for wheezing or shortness of breath. ) 06/24/2019: Use once every two weeks  . aspirin EC 81 MG tablet Take 81 mg by mouth daily.   Sharon Vasquez atorvastatin (LIPITOR) 40 MG tablet Take 1 tablet (40 mg total) by mouth daily.   . benazepril (LOTENSIN) 40 MG tablet Take 1 tablet (40 mg total) by mouth daily.   Sharon Vasquez esomeprazole (NEXIUM) 40 MG capsule Take 1 capsule (40 mg total) by mouth daily as needed (for heartburn or indigestion).   . Fluticasone-Salmeterol (ADVAIR DISKUS) 250-50 MCG/DOSE AEPB Inhale 1 puff into the lungs 2 (two) times daily. Rinse mouth  after each use   . gabapentin (NEURONTIN) 400 MG capsule Take 1 capsule (400 mg total) by mouth 2 (two) times daily.   . hydrALAZINE (APRESOLINE) 25 MG tablet Take 1 tablet (25 mg total) by mouth 3 (three) times daily.   . metoprolol succinate (TOPROL-XL) 100 MG 24 hr tablet TAKE 1 TABLET(100 MG) BY MOUTH DAILY WITH OR IMMEDIATELY FOLLOWING A MEAL   . mirabegron ER (MYRBETRIQ) 50 MG TB24 tablet Take 50 mg by mouth daily. 11/20/2019: Patient states she received 30 day sample from urologist on 11/20/19  . oxyCODONE-acetaminophen (PERCOCET/ROXICET) 5-325 MG tablet Take 1 tablet by mouth daily as needed for severe pain. 06/24/2019: Takes every 6 hours round the clock  . potassium chloride (KLOR-CON) 10 MEQ tablet TAKE 2 TABLETS(20 MEQ) BY MOUTH TWICE DAILY (Patient taking differently: Take 20 mEq by mouth 2 (two) times daily. )   . rivaroxaban (XARELTO) 20 MG TABS tablet Take 1 tablet (20 mg total) by mouth every morning.   . tiotropium (SPIRIVA HANDIHALER) 18 MCG inhalation capsule PLACE 1 CAPSULE INTO INHALER AND INHALE DAILY   . torsemide (DEMADEX) 20 MG tablet TAKE 2 TABLETS BY MOUTH DAILY    No facility-administered encounter medications on file as of 02/05/2020.     Objective:  Wt Readings from Last 3 Encounters:  12/25/19 (!) 374 lb 3.2 oz (169.7 kg)  11/10/19 (!) 376 lb 9.6 oz (170.8 kg)  10/24/19 (!) 378 lb 12.8 oz (171.8 kg)   BP Readings from Last 3 Encounters:  12/25/19 112/80  11/10/19 114/67  10/24/19  120/82   Lab Results  Component Value Date   CHOL 154 07/19/2017   HDL 48 07/19/2017   LDLCALC 86 07/19/2017   TRIG 99 07/19/2017   CHOLHDL 3.2 07/19/2017   Goals Addressed            This Visit's Progress   . "I need a scooter to help me get around"       Current Barriers:  Sharon Vasquez Knowledge Barriers related to resources and support available to address needs related to Inability to perform ADL's independently; need for DME.- returned call to patient in response to voice mail  left earlier today  in which patient stated the paperwork for the electric scooter cannot be sent to [facility] until  Dr Heber [place] signs off on 10/23/19 clinic note   Case Manager Clinical Goal(s):  Sharon Vasquez Over the next 30-90  days, patient will work with BSW to address needs related to Inability to perform ADL's independently, need for DME.   . Over the next 30 -90 days, BSW will collaborate with RN Care Manager to address care management and care coordination needs  Interventions:  . Successfully received faxed package of information from [facility] and [employer]; will request Dr Heber [place] to sign the OT seating evaluation that was completed by Luetta Nutting Ward OTR/L, BCPR, ATP/SMS on 12/01/19 . 01/19/20- Placed order form for electric scooter and OT evaluation in Dr. Jodene Nam clinic mailbox for completion. . Faxed clinic note of 10/23/19 to Elby Showers, [employer] at [phone] . Updated patient on status of electric scooter preauthorization process . 10/19 at 12:35 pm - Received voice mail from [employer] with [employer] and [employer] with patient also on the phone requesting return of signed order for scooter, signed OT seating evaluation and letter of medical necessity. Will message provider about need for items to be faxed to the scooter company ASAP.  Sharon Vasquez 10/20- After consulting with clinic nursing director Yvonna Alanis, sent e-mail to Dr Heber [place] requesting completion of paperwork and offering to hand deliver the paperwork to him since he is not in the clinic this week. Advised patient of these actions by CCM RN. . 01/22/20- Received signed paper work via secure e-mail from [place] at the [place].Securely faxed written electric scooter order and signed electric scooter order and signed OT seating evaluation to [employer] and [employer] at [phone] and received fax verification . Securely e-mailed Elby Showers at [facility] and  [employer] with request to notify this CCM RN when she receives faxed paperwork . Notified patient via phone that completed paperwork has been successfully faxed to [employer] and [employer] . 01/23/20 Received reply e-mail from Elby Showers on 01/22/20 at 3:23 pm that she received the requested signed scooter paperwork from this CCM RN and will be submitting to patient's insurance on 01/22/20 . 01/23/20 Notified patient via phone that signed paperwork was submitted to her health insurance plan by Elby Showers [facility] and [employer]) yesterday . 01/28/20- Notified [name] that all required paperwork was successfully faxed to Elby Showers at [facility] and [employer] with verification via successful fax transmittal notice and e-mail form Megan on 01/22/20. Per Darryl's request, provided Megan's contact information . 02/02/20- spoke with [employer] navigator [name] and then secure e-mail to [employer] at [employer]  . 02/02/20 Collaborated with Dr. Heber [place] to ask for and secure signature on 10/23/19 clinic note when patient was seen for electric scooter evaluation .  02/02/20 Notified patient that paperwork for electric scooter has been submitted to [facility] Medicare . 02/02/20 Notified [place] Medicare  Navigator iva secure emia that paperwork for electric scooter has been submitted by Elby Showers via e-mail . 02/05/20- Successfully faxed 10/23/19 clinic note signed by Dr Heber [place] to [facility] and [employer] and to Elby Showers at [facility] and [employer]   Patient Self Care Activities:  . Attends all scheduled provider appointments . Calls provider office for new concerns or questions . Receives Transport planner  Please see past updates related to this goal by clicking on the "Past Updates" button in the selected goal          Plan:   The care management team will reach out to the patient again over the  next 30-60 days.    Kelli Churn RN, CCM, [facility] RN Care Manager [phone]

## 2020-02-05 NOTE — Progress Notes (Signed)
Internal Medicine Clinic Resident  I have personally reviewed this encounter including the documentation in this note and/or discussed this patient with the care management provider. I will address any urgent items identified by the care management provider and will communicate my actions to the patient's PCP. I have reviewed the patient's CCM visit with my supervising attending, Dr Heber Bell[place].  [place], DO 02/05/2020

## 2020-02-09 NOTE — Progress Notes (Signed)
Internal Medicine Clinic Attending  CCM services provided by the care management provider and their documentation were reviewed with Dr. Lisabeth Devoid.  We reviewed the pertinent findings, urgent action items addressed by the resident and non-urgent items to be addressed by the PCP.  I agree with the assessment, diagnosis, and plan of care documented in the CCM and resident's note.  Oda Kilts, MD 02/09/2020

## 2020-02-11 ENCOUNTER — Encounter: Payer: Medicare Other | Admitting: Student

## 2020-02-11 NOTE — Progress Notes (Signed)
   CC: hypertension, chronic HFpEF, CAD, dysphagia, tobacco use  HPI:  [name] is a 69 y.o. female with history as below presenting for follow up on the above. Please refer to problem based charting for further details of assessment and plan of current problem and chronic medical conditions.   Past Medical History:  Diagnosis Date  . A-fib ([place])   . Anxiety   . Arthritis    "qwhre; joints, back" (04/17/2017)  . Benign breast cyst in female, left 01/08/2017   Found by Screening mammogram, evaluated by U/S on 01/08/17 and determined to be a benign simple breast cyst.  . Cellulitis of left lower leg 05/30/2017  . CHF (congestive heart failure) ([place])   . Chronic low back pain 08/21/2016  . Chronic lower back pain   . Chronic venous insufficiency    [name] 05/30/2017  . COPD (chronic obstructive pulmonary disease) ([place])   . Depression   . DVT (deep venous thrombosis) ([place]) 11/16/2016  . GERD (gastroesophageal reflux disease)   . Headache    "weekly for the last 3 months" (04/17/2017)  . Hyperlipidemia   . Hypertension   . Morbid obesity ([place])   . PE (pulmonary embolism)   . Pulmonary embolism ([place]) 09/21/2014   Review of Systems:   Review of Systems  Constitutional: Positive for weight loss. Negative for chills and fever.  Eyes: Negative for blurred vision and double vision.  Respiratory: Negative for cough and shortness of breath.   Cardiovascular: Negative for chest pain, palpitations and leg swelling.  Gastrointestinal: Negative for abdominal pain, constipation, diarrhea, nausea and vomiting.  Musculoskeletal: Positive for joint pain. Negative for falls.  All other systems reviewed and are negative.    Physical Exam: Vitals:   02/12/20 0849  BP: 102/78  Pulse: (!) 102  Temp: 98.1 F (36.7 C)  TempSrc: Oral  SpO2: 98%  Weight: (!) 359 lb 3.2 oz (162.9 kg)  Height: 6\' 2"  (1.88 m)   Constitutional: no acute distress, morbidly obese Head: atraumatic ENT:  external ears normal Cardiovascular: regular rate and rhythm, normal heart sounds, 1+ pitting lower extremity edema bilaterally Pulmonary: effort normal, normal breath sounds bilaterally, no rales Abdominal: flat, nontender, no rebound tenderness, bowel sounds normal Skin: warm and dry Neurological: alert, no focal deficit Psychiatric: normal mood and affect  Assessment & Plan:   See Encounters Tab for problem based charting.  Patient seen with Dr. Philipp Ovens

## 2020-02-12 ENCOUNTER — Encounter: Payer: Self-pay | Admitting: Student

## 2020-02-12 ENCOUNTER — Ambulatory Visit (INDEPENDENT_AMBULATORY_CARE_PROVIDER_SITE_OTHER): Payer: Medicare Other | Admitting: Student

## 2020-02-12 ENCOUNTER — Other Ambulatory Visit: Payer: Self-pay

## 2020-02-12 DIAGNOSIS — Z23 Encounter for immunization: Secondary | ICD-10-CM | POA: Diagnosis not present

## 2020-02-12 DIAGNOSIS — M810 Age-related osteoporosis without current pathological fracture: Secondary | ICD-10-CM | POA: Diagnosis not present

## 2020-02-12 DIAGNOSIS — R1319 Other dysphagia: Secondary | ICD-10-CM

## 2020-02-12 DIAGNOSIS — I10 Essential (primary) hypertension: Secondary | ICD-10-CM

## 2020-02-12 DIAGNOSIS — I5032 Chronic diastolic (congestive) heart failure: Secondary | ICD-10-CM

## 2020-02-12 NOTE — Assessment & Plan Note (Signed)
Had barium swallow on 01/21/20 that showed esophageal dysmotility and a stricture. GI has an EGD with possible balloon dilation scheduled for 03/09/2020. She notes that she is only eating soft foods and reports weight loss, but is hydrating well.   -On esomeprazole 40 mg daily -Follow-up with GI

## 2020-02-12 NOTE — Assessment & Plan Note (Signed)
BP: 102/78 Denies chest pain, palpitations, dizziness, headaches, LOC. -Benazepril 40 mg daily -Hydralazine 25 mg 3 times a day -Toprol 100 mg daily

## 2020-02-12 NOTE — Assessment & Plan Note (Signed)
>>  ASSESSMENT AND PLAN FOR DIASTOLIC HEART FAILURE (HCC) WRITTEN ON 02/12/2020  1:28 PM BY Remo Lipps, MD  Echo on 09/30/19 showed EF of 65-75% with Grade 2 diastolic dysfunction.  Previously report difficulty compliant with torsemide prescription due to frequent urination, received pure wick to help with this.  States she is now compliant with her torsemide, and her shortness of breath and leg swelling have improved.  -Continue Toprol 100 mg daily -Continue torsemide 20 mg daily. She often takes 40mg  daily depending on her symptoms and activities

## 2020-02-12 NOTE — Assessment & Plan Note (Signed)
Denies any recent falls.  Continues on esomeprazole per GI for reflux and dysphagia.

## 2020-02-12 NOTE — Assessment & Plan Note (Signed)
Echo on 09/30/19 showed EF of 65-75% with Grade 2 diastolic dysfunction.  Previously report difficulty compliant with torsemide prescription due to frequent urination, received pure wick to help with this.  States she is now compliant with her torsemide, and her shortness of breath and leg swelling have improved.  -Continue Toprol 100 mg daily -Continue torsemide 20 mg daily. She often takes 40mg  daily depending on her symptoms and activities

## 2020-02-12 NOTE — Patient Instructions (Addendum)
Thank you for allowing [place] to be a part of your care today, it was a pleasure seeing you. We discussed your hypertension, heart failure, difficulty swallowing, tobacco use  Overall, symptoms are going quite well! I am not making any changes to your medications.  Your blood pressures under good control. Your heart failure symptoms are improved now with the pure wick and the torsemide.  Please follow-up with GI for that EGD.  Try continue cutting back on tobacco use.  Please follow up in 3 to 6 months   Thank you, and please call the Internal Medicine Clinic at [phone] if you have any questions.  Best, Dr. [name]

## 2020-02-13 NOTE — Progress Notes (Signed)
Internal Medicine Clinic Attending  I saw and evaluated the patient.  I personally confirmed the key portions of the history and exam documented by Dr. Chen and I reviewed pertinent patient test results.  The assessment, diagnosis, and plan were formulated together and I agree with the documentation in the resident's note.  

## 2020-02-17 ENCOUNTER — Ambulatory Visit: Payer: Medicare Other | Admitting: *Deleted

## 2020-02-17 DIAGNOSIS — I5032 Chronic diastolic (congestive) heart failure: Secondary | ICD-10-CM

## 2020-02-17 DIAGNOSIS — I480 Paroxysmal atrial fibrillation: Secondary | ICD-10-CM

## 2020-02-17 DIAGNOSIS — R1319 Other dysphagia: Secondary | ICD-10-CM

## 2020-02-17 DIAGNOSIS — R32 Unspecified urinary incontinence: Secondary | ICD-10-CM

## 2020-02-17 DIAGNOSIS — I10 Essential (primary) hypertension: Secondary | ICD-10-CM

## 2020-02-17 DIAGNOSIS — J449 Chronic obstructive pulmonary disease, unspecified: Secondary | ICD-10-CM

## 2020-02-17 NOTE — Chronic Care Management (AMB) (Signed)
Chronic Care Management   Follow Up Note   02/17/2020 Name: Sharon Vasquez [name]: [id number] DOB: 09-10-1950  Referred by: Andrew Au, MD Reason for referral : Chronic Care Management (HTN, Asthma, OSA, Seizures, Anxiety, Depression, Urinary Incontinence)   Sharon Vasquez is a 69 y.o. year old female who is a primary care patient of [name], [name], MD. The CCM team was consulted for assistance with chronic disease management and care coordination needs.    Review of patient status, including review of consultants reports, relevant laboratory and other test results, and collaboration with appropriate care team members and the patient's provider was performed as part of comprehensive patient evaluation and provision of chronic care management services.    SDOH (Social Determinants of Health) assessments performed: No See Care Plan activities for detailed interventions related to [facility])     Outpatient Encounter Medications as of 02/17/2020  Medication Sig Note  . albuterol (PROVENTIL) (2.5 MG/3ML) 0.083% nebulizer solution TAKE 3 MLS(1 AMPULE) BY NEBULIZATION EVERY 4 HOURS AS NEEDED FOR WHHEZING OR SHORTNESS OF BREATH (Patient taking differently: Take 2.5 mg by nebulization every 4 (four) hours as needed for wheezing or shortness of breath. ) 06/24/2019: Use once every two weeks  . albuterol (VENTOLIN HFA) 108 (90 Base) MCG/ACT inhaler USE 2 INHALATIONS BY MOUTH  EVERY 6 HOURS AS NEEDED FOR WHEEZING OR SHORTNESS OF  BREATH   . aspirin EC 81 MG tablet Take 81 mg by mouth daily.   [name] atorvastatin (LIPITOR) 40 MG tablet Take 1 tablet (40 mg total) by mouth daily.   . benazepril (LOTENSIN) 40 MG tablet Take 1 tablet (40 mg total) by mouth daily.   [name] esomeprazole (NEXIUM) 40 MG capsule Take 1 capsule (40 mg total) by mouth daily as needed (for heartburn or indigestion).   . Fluticasone-Salmeterol (ADVAIR DISKUS) 250-50 MCG/DOSE AEPB Inhale 1 puff into the lungs 2 (two) times daily. Rinse mouth  after each use   . gabapentin (NEURONTIN) 400 MG capsule TAKE 1 CAPSULE BY MOUTH  TWICE DAILY   . hydrALAZINE (APRESOLINE) 25 MG tablet Take 1 tablet (25 mg total) by mouth 3 (three) times daily.   . metoprolol succinate (TOPROL-XL) 100 MG 24 hr tablet TAKE 1 TABLET(100 MG) BY MOUTH DAILY WITH OR IMMEDIATELY FOLLOWING A MEAL   . mirabegron ER (MYRBETRIQ) 50 MG TB24 tablet Take 50 mg by mouth daily. 11/20/2019: Patient states she received 30 day sample from urologist on 11/20/19  . oxyCODONE-acetaminophen (PERCOCET/ROXICET) 5-325 MG tablet Take 1 tablet by mouth daily as needed for severe pain. 06/24/2019: Takes every 6 hours round the clock  . potassium chloride (KLOR-CON) 10 MEQ tablet TAKE 2 TABLETS(20 MEQ) BY MOUTH TWICE DAILY (Patient taking differently: Take 20 mEq by mouth 2 (two) times daily. )   . rivaroxaban (XARELTO) 20 MG TABS tablet Take 1 tablet (20 mg total) by mouth every morning.   . tiotropium (SPIRIVA HANDIHALER) 18 MCG inhalation capsule PLACE 1 CAPSULE INTO INHALER AND INHALE DAILY   . torsemide (DEMADEX) 20 MG tablet TAKE 2 TABLETS BY MOUTH DAILY    No facility-administered encounter medications on file as of 02/17/2020.     Objective:  Wt Readings from Last 3 Encounters:  02/12/20 (!) 359 lb 3.2 oz (162.9 kg)  12/25/19 (!) 374 lb 3.2 oz (169.7 kg)  11/10/19 (!) 376 lb 9.6 oz (170.8 kg)   BP Readings from Last 3 Encounters:  02/12/20 102/78  12/25/19 112/80  11/10/19 114/67  Goals Addressed              This Visit's Progress     Patient Stated   .  "I try to do what my doctors tell me to do" (pt-stated)        [facility] (see longitudinal plan of care for additional care plan information)  Current Barriers:  . Chronic Disease Management support, education, and care coordination needs related to Atrial Fibrillation, CHF, HTN, and COPD-patient had routine follow up clinic visit on 02/12/20  Clinical Goal(s) related to Atrial Fibrillation, CHF, HTN, and  COPD:  Over the next 30 days, patient will:  . Work with the care management team to address educational, disease management, and care coordination needs  . Begin or continue self health monitoring activities as directed today Measure and record blood pressure 5-7 times per week and Measure and record weight daily . Call provider office for new or worsened signs and symptoms Blood pressure findings outside established parameters, Weight outside established parameters, Shortness of breath, and New or worsened symptom related to CHF and COPD . Call care management team with questions or concerns . Verbalize basic understanding of patient centered plan of care established today  Interventions related to Atrial Fibrillation, CHF, HTN, and COPD:  . Appropriate assessments completed . Reviewed clinic visit note of 02/12/20 and left message for patient congratulating her on good medication taking behavior, weight loss, and excellent blood pressure  reading. Also wished her Happy Thanksgiving.   Patient Self Care Activities related to Atrial Fibrillation, CHF, HTN, and COPD:  . Patient is unable to independently self-manage chronic health conditions  Please see past updates related to this goal by clicking on the "Past Updates" button in the selected goal      .  "They want to put a balloon down my throat, I don't understand what for. Please call me" (pt-stated)        CARE PLAN ENTRY (see longitudinal plan of care for additional care plan information)  Current Barriers:  [name] Knowledge Deficits related to esophageal dilation procedure- procedure scheduled for 03/09/20  Nurse Case Manager Clinical Goal(s):  [name] Over the next 7-14 days, patient will demonstrate understanding of rationale for esophageal dilation as evidenced by voicing understanding of why there procedure is done, what the expected result of the procedure is, what happens before during and after the procedure, what care is needed at home  post procedure,  post procedure problems requiring MD notification , and post procedure treatment   Interventions:  . Inter-disciplinary care team collaboration (see longitudinal plan of care) . Provided education to patient via reading patient the [facility] education handout "esophageal dilation; and offering to e-mail it to her  . Encouraged patient to write down questions or concerns to discuss her gastroenterologist prior to the procedure  . Left message for patient on 02/17/20 advising her this CCM RN is aware of procedure scheduled for 03/09/20 and encouraged patient to call this CCM RN for questions or concerns. Will plan to call patient post procedure.   Patient Self Care Activities:  . Patient verbalizes understanding of plan to speak with gastroenterologist to address patient's questions and/or concerns related to the procedure . Unable to independently understand esophageal dilation procedure  Please see past updates related to this goal by clicking on the "Past Updates" button in the selected goal          Plan:   The care management team will reach out to the  patient again over the next 30 days.    Kelli Churn RN, CCM, [facility] RN Care Manager [phone]

## 2020-02-18 NOTE — Progress Notes (Signed)
Internal Medicine Clinic Resident  I have personally reviewed this encounter including the documentation in this note and/or discussed this patient with the care management provider. I will address any urgent items identified by the care management provider and will communicate my actions to the patient's PCP. I have reviewed the patient's CCM visit with my supervising attending, Dr Vincent.  Demontrae Gilbert, MD 02/18/2020  

## 2020-02-18 NOTE — Progress Notes (Signed)
Internal Medicine Clinic Attending  CCM services provided by the care management provider and their documentation were discussed with Dr. Basaraba. We reviewed the pertinent findings, urgent action items addressed by the resident and non-urgent items to be addressed by the PCP.  I agree with the assessment, diagnosis, and plan of care documented in the CCM and resident's note.  Sarahmarie Leavey Thomas Nikko Goldwire, MD 02/18/2020  

## 2020-02-23 DIAGNOSIS — G4733 Obstructive sleep apnea (adult) (pediatric): Secondary | ICD-10-CM | POA: Diagnosis not present

## 2020-03-03 NOTE — Progress Notes (Signed)
Attempted to obtain medical history via telephone, unable to reach at this time. I left a voicemail to return pre surgical testing department's phone call.  

## 2020-03-04 ENCOUNTER — Ambulatory Visit: Payer: Medicare Other | Admitting: *Deleted

## 2020-03-04 DIAGNOSIS — I10 Essential (primary) hypertension: Secondary | ICD-10-CM

## 2020-03-04 DIAGNOSIS — I5032 Chronic diastolic (congestive) heart failure: Secondary | ICD-10-CM

## 2020-03-04 DIAGNOSIS — J449 Chronic obstructive pulmonary disease, unspecified: Secondary | ICD-10-CM

## 2020-03-04 DIAGNOSIS — R1319 Other dysphagia: Secondary | ICD-10-CM

## 2020-03-04 DIAGNOSIS — M170 Bilateral primary osteoarthritis of knee: Secondary | ICD-10-CM | POA: Diagnosis not present

## 2020-03-04 DIAGNOSIS — G8929 Other chronic pain: Secondary | ICD-10-CM

## 2020-03-04 DIAGNOSIS — R32 Unspecified urinary incontinence: Secondary | ICD-10-CM

## 2020-03-04 DIAGNOSIS — I480 Paroxysmal atrial fibrillation: Secondary | ICD-10-CM

## 2020-03-04 DIAGNOSIS — M545 Low back pain, unspecified: Secondary | ICD-10-CM

## 2020-03-04 NOTE — Chronic Care Management (AMB) (Signed)
Chronic Care Management   Follow Up Note   03/04/2020 Name: Sharon Vasquez [name]: [id number] DOB: Dec 21, 1950  Referred by: Andrew Au, MD Reason for referral : Chronic Care Management (HTN, Asthma, OSA, Seizures, Anxiety, Depression, Urinary Incontinence)   Sharon Vasquez is a 69 y.o. year old female who is a primary care patient of [name], [name], MD. The CCM team was consulted for assistance with chronic disease management and care coordination needs.    Review of patient status, including review of consultants reports, relevant laboratory and other test results, and collaboration with appropriate care team members and the patient's provider was performed as part of comprehensive patient evaluation and provision of chronic care management services.    SDOH (Social Determinants of Health) assessments performed: No See Care Plan activities for detailed interventions related to [facility])     Outpatient Encounter Medications as of 03/04/2020  Medication Sig Note  . albuterol (PROVENTIL) (2.5 MG/3ML) 0.083% nebulizer solution TAKE 3 MLS(1 AMPULE) BY NEBULIZATION EVERY 4 HOURS AS NEEDED FOR WHHEZING OR SHORTNESS OF BREATH (Patient taking differently: Take 2.5 mg by nebulization every 4 (four) hours as needed for wheezing or shortness of breath. ) 06/24/2019: Use once every two weeks  . albuterol (VENTOLIN HFA) 108 (90 Base) MCG/ACT inhaler USE 2 INHALATIONS BY MOUTH  EVERY 6 HOURS AS NEEDED FOR WHEEZING OR SHORTNESS OF  BREATH (Patient taking differently: Inhale 2 puffs into the lungs every 6 (six) hours as needed for shortness of breath. )   . aspirin EC 81 MG tablet Take 81 mg by mouth daily.   [name] atorvastatin (LIPITOR) 40 MG tablet Take 1 tablet (40 mg total) by mouth daily.   . benazepril (LOTENSIN) 40 MG tablet Take 1 tablet (40 mg total) by mouth daily.   [name] esomeprazole (NEXIUM) 40 MG capsule Take 1 capsule (40 mg total) by mouth daily as needed (for heartburn or indigestion). (Patient taking  differently: Take 40 mg by mouth daily. )   . Fluticasone-Salmeterol (ADVAIR DISKUS) 250-50 MCG/DOSE AEPB Inhale 1 puff into the lungs 2 (two) times daily. Rinse mouth after each use   . gabapentin (NEURONTIN) 400 MG capsule TAKE 1 CAPSULE BY MOUTH  TWICE DAILY (Patient taking differently: Take 400 mg by mouth 2 (two) times daily. )   . hydrALAZINE (APRESOLINE) 25 MG tablet Take 1 tablet (25 mg total) by mouth 3 (three) times daily. (Patient taking differently: Take 25 mg by mouth daily as needed (As need for high blood pressure). )   . ibuprofen (ADVIL) 200 MG tablet Take 800 mg by mouth every 6 (six) hours as needed (Swelling).   . metoprolol succinate (TOPROL-XL) 100 MG 24 hr tablet TAKE 1 TABLET(100 MG) BY MOUTH DAILY WITH OR IMMEDIATELY FOLLOWING A MEAL (Patient taking differently: Take 100 mg by mouth daily as needed (High blood pressure). )   . mirabegron ER (MYRBETRIQ) 50 MG TB24 tablet Take 50 mg by mouth daily. 11/20/2019: Patient states she received 30 day sample from urologist on 11/20/19  . oxyCODONE-acetaminophen (PERCOCET/ROXICET) 5-325 MG tablet Take 1 tablet by mouth daily as needed for severe pain. 06/24/2019: Takes every 6 hours round the clock  . potassium chloride (KLOR-CON) 10 MEQ tablet TAKE 2 TABLETS(20 MEQ) BY MOUTH TWICE DAILY (Patient taking differently: Take 20 mEq by mouth 2 (two) times daily as needed (If taking fluid pills). )   . rivaroxaban (XARELTO) 20 MG TABS tablet Take 1 tablet (20 mg total) by mouth every morning.   [name]  tiotropium (SPIRIVA HANDIHALER) 18 MCG inhalation capsule PLACE 1 CAPSULE INTO INHALER AND INHALE DAILY (Patient taking differently: Place 18 mcg into inhaler and inhale daily. )   . torsemide (DEMADEX) 20 MG tablet TAKE 2 TABLETS BY MOUTH DAILY (Patient taking differently: Take 40 mg by mouth daily. )    No facility-administered encounter medications on file as of 03/04/2020.     Objective:  Wt Readings from Last 3 Encounters:  02/12/20 (!) 359 lb  3.2 oz (162.9 kg)  12/25/19 (!) 374 lb 3.2 oz (169.7 kg)  11/10/19 (!) 376 lb 9.6 oz (170.8 kg)   BP Readings from Last 3 Encounters:  02/12/20 102/78  12/25/19 112/80  11/10/19 114/67    Goals Addressed            This Visit's Progress   . COMPLETED: "I need a scooter to help me get around"       Current Barriers:  [name] Knowledge Barriers related to resources and support available to address needs related to Inability to perform ADL's independently; need for DME.- returned call to patient in response to voice mail left earlier today  in which patient stated the she received her electric scooter today and is very happy, she expressed much appreciation to this CCM RN and Dr [name] and Dr Heber [place]  Case Manager Clinical Goal(s):  [name] Over the next 30-90  days, patient will work with BSW to address needs related to Inability to perform ADL's independently, need for DME.   . Over the next 30 -90 days, BSW will collaborate with RN Care Manager to address care management and care coordination needs  Interventions:  . Successfully received faxed package of information from [facility] and [employer]; will request Dr Heber Sevier to sign the OT seating evaluation that was completed by Luetta Nutting Ward OTR/L, BCPR, ATP/SMS on 12/01/19 . 01/19/20- Placed order form for electric scooter and OT evaluation in Dr. Jodene Nam clinic mailbox for completion. . Faxed clinic note of 10/23/19 to Elby Showers, [employer] at [phone] . Updated patient on status of electric scooter preauthorization process . 10/19 at 12:35 pm - Received voice mail from [employer] with [employer] and [employer] with patient also on the phone requesting return of signed order for scooter, signed OT seating evaluation and letter of medical necessity. Will message provider about need for items to be faxed to the scooter company ASAP.  [name] 10/20- After consulting with clinic nursing director Yvonna Alanis, sent e-mail  to Dr Heber Borup requesting completion of paperwork and offering to hand deliver the paperwork to him since he is not in the clinic this week. Advised patient of these actions by CCM RN. . 01/22/20- Received signed paper work via secure e-mail from [place] at the [place].Securely faxed written electric scooter order and signed electric scooter order and signed OT seating evaluation to [employer] and [employer] at [phone] and received fax verification . Securely e-mailed Elby Showers at [facility] and [employer] with request to notify this CCM RN when she receives faxed paperwork . Notified patient via phone that completed paperwork has been successfully faxed to [employer] and [employer] . 01/23/20 Received reply e-mail from Elby Showers on 01/22/20 at 3:23 pm that she received the requested signed scooter paperwork from this CCM RN and will be submitting to patient's insurance on 01/22/20 . 01/23/20 Notified patient via phone that signed paperwork was submitted to her health insurance plan by Elby Showers [facility] and [employer])  yesterday . 01/28/20- Notified [name] that all required paperwork was successfully faxed to Elby Showers at [facility] and [employer] with verification via successful fax transmittal notice and e-mail form Megan on 01/22/20. Per Darryl's request, provided Megan's contact information . 02/02/20- spoke with [employer] navigator [name] and then secure e-mail to [employer] at [employer]  . 02/02/20 Collaborated with Dr. Heber [place] to ask for and secure signature on 10/23/19 clinic note when patient was seen for electric scooter evaluation . 02/02/20 Notified patient that paperwork for electric scooter has been submitted to [facility] Medicare . 02/02/20 Notified [place] [employer]  Navigator iva secure emia that paperwork for electric scooter has been submitted by Elby Showers via e-mail . 02/05/20- Successfully faxed 10/23/19 clinic note signed by Dr Heber [place] to [facility] and [employer] and to Elby Showers at [employer] and [employer] . 03/04/20 Thanked patient for updating this CCM that she received her electric scooter today . 03/04/20- with patient's permission, secure e-mail sent to her [place]  . 03/04/20- with patient's agreement , messaged Dr [name] and Dr Heber [place] to thank them  for assisting with securing the scooter   Patient Self Care Activities:  . Attends all scheduled provider appointments . Calls provider office for new concerns or questions . Receives Transport planner  Please see past updates related to this goal by clicking on the "Past Updates" button in the selected goal          Plan:   The care management team will reach out to the patient again over the next 30-60 days.   Kelli Churn RN, CCM, [facility] RN Care Manager [phone]

## 2020-03-05 ENCOUNTER — Other Ambulatory Visit (HOSPITAL_COMMUNITY)
Admission: RE | Admit: 2020-03-05 | Discharge: 2020-03-05 | Disposition: A | Discharge status: Home / Self Care | Payer: Medicare Other | Source: Ambulatory Visit | Attending: Gastroenterology | Admitting: Gastroenterology

## 2020-03-05 DIAGNOSIS — Z01812 Encounter for preprocedural laboratory examination: Secondary | ICD-10-CM | POA: Insufficient documentation

## 2020-03-05 DIAGNOSIS — Z20822 Contact with and (suspected) exposure to covid-19: Secondary | ICD-10-CM | POA: Diagnosis not present

## 2020-03-05 LAB — SARS CORONAVIRUS 2 (TAT 6-24 HRS): SARS Coronavirus 2: NEGATIVE

## 2020-03-05 NOTE — Progress Notes (Signed)
Internal Medicine Clinic Resident  I have personally reviewed this encounter including the documentation in this note and/or discussed this patient with the care management provider. I will address any urgent items identified by the care management provider and will communicate my actions to the patient's PCP. I have reviewed the patient's CCM visit with my supervising attending, Dr Rebeca Alert.  Gaylan Gerold, DO 03/05/2020

## 2020-03-09 ENCOUNTER — Encounter (HOSPITAL_COMMUNITY)
Admission: RE | Disposition: A | Discharge status: Home / Self Care | Payer: Self-pay | Source: Home / Self Care | Attending: Gastroenterology

## 2020-03-09 ENCOUNTER — Ambulatory Visit (HOSPITAL_COMMUNITY): Payer: Medicare Other | Admitting: Certified Registered Nurse Anesthetist

## 2020-03-09 ENCOUNTER — Encounter (HOSPITAL_COMMUNITY): Payer: Self-pay | Admitting: Gastroenterology

## 2020-03-09 ENCOUNTER — Other Ambulatory Visit: Payer: Self-pay

## 2020-03-09 ENCOUNTER — Ambulatory Visit (HOSPITAL_COMMUNITY)
Admission: RE | Admit: 2020-03-09 | Discharge: 2020-03-09 | Disposition: A | Discharge status: Home / Self Care | Payer: Medicare Other | Attending: Gastroenterology | Admitting: Gastroenterology

## 2020-03-09 DIAGNOSIS — Z86718 Personal history of other venous thrombosis and embolism: Secondary | ICD-10-CM | POA: Insufficient documentation

## 2020-03-09 DIAGNOSIS — K222 Esophageal obstruction: Secondary | ICD-10-CM | POA: Insufficient documentation

## 2020-03-09 DIAGNOSIS — Z86711 Personal history of pulmonary embolism: Secondary | ICD-10-CM | POA: Diagnosis not present

## 2020-03-09 DIAGNOSIS — F17210 Nicotine dependence, cigarettes, uncomplicated: Secondary | ICD-10-CM | POA: Diagnosis not present

## 2020-03-09 DIAGNOSIS — Z7982 Long term (current) use of aspirin: Secondary | ICD-10-CM | POA: Diagnosis not present

## 2020-03-09 DIAGNOSIS — K2289 Other specified disease of esophagus: Secondary | ICD-10-CM | POA: Diagnosis not present

## 2020-03-09 DIAGNOSIS — R1310 Dysphagia, unspecified: Secondary | ICD-10-CM | POA: Insufficient documentation

## 2020-03-09 DIAGNOSIS — Z7951 Long term (current) use of inhaled steroids: Secondary | ICD-10-CM | POA: Insufficient documentation

## 2020-03-09 DIAGNOSIS — Z7901 Long term (current) use of anticoagulants: Secondary | ICD-10-CM | POA: Insufficient documentation

## 2020-03-09 DIAGNOSIS — I480 Paroxysmal atrial fibrillation: Secondary | ICD-10-CM | POA: Diagnosis not present

## 2020-03-09 DIAGNOSIS — I4891 Unspecified atrial fibrillation: Secondary | ICD-10-CM | POA: Insufficient documentation

## 2020-03-09 DIAGNOSIS — R933 Abnormal findings on diagnostic imaging of other parts of digestive tract: Secondary | ICD-10-CM | POA: Diagnosis not present

## 2020-03-09 DIAGNOSIS — J9611 Chronic respiratory failure with hypoxia: Secondary | ICD-10-CM | POA: Diagnosis not present

## 2020-03-09 DIAGNOSIS — Z79899 Other long term (current) drug therapy: Secondary | ICD-10-CM | POA: Diagnosis not present

## 2020-03-09 DIAGNOSIS — E785 Hyperlipidemia, unspecified: Secondary | ICD-10-CM | POA: Diagnosis not present

## 2020-03-09 HISTORY — PX: BALLOON DILATION: SHX5330

## 2020-03-09 HISTORY — DX: Sleep apnea, unspecified: G47.30

## 2020-03-09 HISTORY — PX: ESOPHAGOGASTRODUODENOSCOPY (EGD) WITH PROPOFOL: SHX5813

## 2020-03-09 HISTORY — PX: BIOPSY: SHX5522

## 2020-03-09 SURGERY — ESOPHAGOGASTRODUODENOSCOPY (EGD) WITH PROPOFOL
Anesthesia: Monitor Anesthesia Care

## 2020-03-09 MED ORDER — PROPOFOL 500 MG/50ML IV EMUL
INTRAVENOUS | Status: DC | PRN
  Administered 2020-03-09: 150 ug/kg/min via INTRAVENOUS

## 2020-03-09 MED ORDER — PROPOFOL 10 MG/ML IV BOLUS
INTRAVENOUS | Status: DC | PRN
  Administered 2020-03-09 (×2): 30 mg via INTRAVENOUS

## 2020-03-09 MED ORDER — LIDOCAINE 2% (20 MG/ML) 5 ML SYRINGE
INTRAMUSCULAR | Status: DC | PRN
  Administered 2020-03-09: 80 mg via INTRAVENOUS

## 2020-03-09 MED ORDER — PHENYLEPHRINE 40 MCG/ML (10ML) SYRINGE FOR IV PUSH (FOR BLOOD PRESSURE SUPPORT)
PREFILLED_SYRINGE | INTRAVENOUS | Status: DC | PRN
  Administered 2020-03-09 (×3): 120 ug via INTRAVENOUS

## 2020-03-09 MED ORDER — LACTATED RINGERS IV SOLN: INTRAVENOUS | Status: DC | PRN

## 2020-03-09 MED ORDER — SODIUM CHLORIDE 0.9 % IV SOLN: INTRAVENOUS | Status: DC

## 2020-03-09 MED ORDER — LACTATED RINGERS IV SOLN: INTRAVENOUS | Status: DC

## 2020-03-09 SURGICAL SUPPLY — 15 items

## 2020-03-09 NOTE — Op Note (Signed)
[facility] Patient Name: Sharon Vasquez Procedure Date: 03/09/2020 [name]: [id number] Attending MD: Otis Brace , MD Date of Birth: 19-Jan-1951 CSN: [id number] Age: 69 Admit Type: Outpatient Procedure:                Upper GI endoscopy Indications:              Dysphagia, Abnormal UGI series Providers:                Otis Brace, MD, [name], RN, Lesia Sago, Technician, Cletis Athens, Technician,                            Caryl Pina CRNA Referring MD:              Medicines:                Sedation Administered by an Anesthesia Professional Complications:            No immediate complications. Estimated Blood Loss:     Estimated blood loss was minimal. Procedure:                Pre-Anesthesia Assessment:                           - Prior to the procedure, a History and Physical                            was performed, and patient medications and                            allergies were reviewed. The patient's tolerance of                            previous anesthesia was also reviewed. The risks                            and benefits of the procedure and the sedation                            options and risks were discussed with the patient.                            All questions were answered, and informed consent                            was obtained. Prior Anticoagulants: The patient has                            taken Xarelto (rivaroxaban), last dose was 2 days                            prior to procedure. ASA Grade Assessment: III - A  patient with severe systemic disease. After                            reviewing the risks and benefits, the patient was                            deemed in satisfactory condition to undergo the                            procedure.                           After obtaining informed consent, the endoscope was                            passed  under direct vision. Throughout the                            procedure, the patient's blood pressure, pulse, and                            oxygen saturations were monitored continuously. The                            GIF-H190 ([id number]) Olympus gastroscope was                            introduced through the mouth, and advanced to the                            second part of duodenum. The upper GI endoscopy was                            accomplished without difficulty. The patient                            tolerated the procedure well. Scope In: Scope Out: Findings:      One benign-appearing, intrinsic mild (non-circumferential scarring)       stenosis was found at the gastroesophageal junction. The stenosis was       traversed. A TTS dilator was passed through the scope. Dilation with a       15-16.5-18 mm balloon dilator was performed to 18 mm. The dilation site       was examined following endoscope reinsertion and showed no bleeding,       mucosal tear or perforation.      The Z-line was irregular and was found 42 cm from the incisors. Biopsies       were taken with a cold forceps for histology.      No gross lesions were noted in the entire examined stomach.      The cardia and gastric fundus were normal on retroflexion.      The duodenal bulb, first portion of the duodenum and second portion of       the duodenum were normal. Impression:               -  Benign-appearing esophageal stenosis. Dilated.                           - Z-line irregular, 42 cm from the incisors.                            Biopsied.                           - No gross lesions in the stomach.                           - Normal duodenal bulb, first portion of the                            duodenum and second portion of the duodenum. Moderate Sedation:      Moderate (conscious) sedation was personally administered by an       anesthesia professional. The following parameters were monitored: oxygen        saturation, heart rate, blood pressure, and response to care. Recommendation:           - Patient has a contact number available for                            emergencies. The signs and symptoms of potential                            delayed complications were discussed with the                            patient. Return to normal activities tomorrow.                            Written discharge instructions were provided to the                            patient.                           - Resume previous diet.                           - Continue present medications.                           - Await pathology results.                           - Return to GI office as previously scheduled. Procedure Code(s):        --- Professional ---                           [id number], Esophagogastroduodenoscopy, flexible,                            transoral; with transendoscopic balloon dilation of  esophagus (less than 30 mm diameter)                           43239, 59, Esophagogastroduodenoscopy, flexible,                            transoral; with biopsy, single or multiple Diagnosis Code(s):        --- Professional ---                           K22.2, Esophageal obstruction                           K22.8, Other specified diseases of esophagus                           R13.10, Dysphagia, unspecified                           R93.3, Abnormal findings on diagnostic imaging of                            other parts of digestive tract CPT copyright 2019 American Medical Association. All rights reserved. The codes documented in this report are preliminary and upon coder review may  be revised to meet current compliance requirements. Otis Brace, MD Otis Brace, MD 03/09/2020 8:56:49 AM Number of Addenda: 0

## 2020-03-09 NOTE — Transfer of Care (Signed)
Immediate Anesthesia Transfer of Care Note  Patient: Sharon Vasquez  Procedure(s) Performed: ESOPHAGOGASTRODUODENOSCOPY (EGD) WITH PROPOFOL (N/A ) BALLOON DILATION (N/A ) BIOPSY  Patient Location: Endoscopy Unit  Anesthesia Type:MAC  Level of Consciousness: drowsy and responds to stimulation  Airway & Oxygen Therapy: Patient Spontanous Breathing and Patient connected to face mask oxygen  Post-op Assessment: Report given to RN, Post -op Vital signs reviewed and stable and Patient moving all extremities  Post vital signs: Reviewed and stable  Last Vitals:  Vitals Value Taken Time  BP    Temp    Pulse 82 03/09/20 0900  Resp 26 03/09/20 0900  SpO2 98 % 03/09/20 0900  Vitals shown include unvalidated device data.  Last Pain:  Vitals:   03/09/20 0741  TempSrc: Oral  PainSc: 0-No pain         Complications: No complications documented.

## 2020-03-09 NOTE — Discharge Instructions (Signed)

## 2020-03-09 NOTE — Anesthesia Preprocedure Evaluation (Addendum)
Anesthesia Evaluation  Patient identified by [name], date of birth, ID band Patient awake    Reviewed: Allergy & Precautions, NPO status , Patient's Chart, lab work & pertinent test results  History of Anesthesia Complications Negative for: history of anesthetic complications  Airway Mallampati: IV  TM Distance: >3 FB Neck ROM: Full    Dental  (+) Dental Advisory Given, Poor Dentition, Missing, Chipped, Edentulous Upper   Pulmonary sleep apnea and Continuous Positive Airway Pressure Ventilation , COPD, Current Smoker and Patient abstained from smoking.,     + wheezing      Cardiovascular hypertension, Pt. on medications and Pt. on home beta blockers + CAD, + Peripheral Vascular Disease and +CHF   Rhythm:Regular  1. Left ventricular ejection fraction, by estimation, is 65 to 70%. The  left ventricle has hyperdynamic function. The left ventricle has no  regional wall motion abnormalities. The left ventricular internal cavity  size was mildly dilated. There is mild  concentric left ventricular hypertrophy. Left ventricular diastolic  parameters are consistent with Grade II diastolic dysfunction  (pseudonormalization). Elevated left atrial pressure.  2. Right ventricular systolic function is hyperdynamic. The right  ventricular size is normal.  3. Left atrial size was moderately dilated.  4. The mitral valve is normal in structure. Trivial mitral valve  regurgitation.  5. The aortic valve is normal in structure. Aortic valve regurgitation is  not visualized. Mild aortic valve sclerosis is present, with no evidence  of aortic valve stenosis.  6. The inferior vena cava is dilated in size with >50% respiratory  variability, suggesting right atrial pressure of 8 mmHg.    Neuro/Psych  Headaches, PSYCHIATRIC DISORDERS Anxiety Depression    GI/Hepatic Neg liver ROS,   Endo/Other  Morbid obesity  Renal/GU negative Renal ROS      Musculoskeletal  (+) Arthritis ,   Abdominal   Peds  Hematology   Anesthesia Other Findings   Reproductive/Obstetrics                            Anesthesia Physical Anesthesia Plan  ASA: III  Anesthesia Plan: MAC   Post-op Pain Management:    Induction:   PONV Risk Score and Plan: 1 and Propofol infusion and Treatment may vary due to age or medical condition  Airway Management Planned: Nasal Cannula  Additional Equipment: None  Intra-op Plan:   Post-operative Plan:   Informed Consent: I have reviewed the patients History and Physical, chart, labs and discussed the procedure including the risks, benefits and alternatives for the proposed anesthesia with the patient or authorized representative who has indicated his/her understanding and acceptance.     Dental advisory given  Plan Discussed with: CRNA and Anesthesiologist  Anesthesia Plan Comments:         Anesthesia Quick Evaluation

## 2020-03-09 NOTE — H&P (Signed)
Primary Care Physician:  Andrew Au, MD Primary Gastroenterologist:  Dr. Alessandra Bevels  Reason for Visit : EGD for abnormal barium swallow and dysphagia  HPI: Sharon Vasquez is a 69 y.o. female with past medical history of interstitial lung disease, history of atrial fibrillation on anticoagulation history of CHF and pulmonary embolism was seen in the office for dysphagia.  Barium swallow was ordered which showed distal esophageal stricture.  Patient currently on Nexium.  Continues to have intermittent dysphagia.  Complaining of mild shortness of breath.  Denies any chest pain.  Anticoagulation on hold for last 2 days. Past Medical History:  Diagnosis Date  . A-fib ([place])   . Anxiety   . Arthritis    "qwhre; joints, back" (04/17/2017)  . Benign breast cyst in female, left 01/08/2017   Found by Screening mammogram, evaluated by U/[facility] on 01/08/17 and determined to be a benign simple breast cyst.  . Cellulitis of left lower leg 05/30/2017  . CHF (congestive heart failure) ([place])   . Chronic low back pain 08/21/2016  . Chronic lower back pain   . Chronic venous insufficiency    [name] 05/30/2017  . COPD (chronic obstructive pulmonary disease) ([place])   . Depression   . DVT (deep venous thrombosis) ([place]) 11/16/2016  . GERD (gastroesophageal reflux disease)   . Headache    "weekly for the last 3 months" (04/17/2017)  . Hyperlipidemia   . Hypertension   . Morbid obesity ([place])   . PE (pulmonary embolism)   . Pulmonary embolism ([place]) 09/21/2014  . Sleep apnea     Past Surgical History:  Procedure Laterality Date  . ABDOMINAL HYSTERECTOMY    . APPENDECTOMY    . BREAST CYST EXCISION Left    "six o'clock"  . BREAST LUMPECTOMY Left   . CHOLECYSTECTOMY    . DILATION AND CURETTAGE OF UTERUS    . IR ABLATE LIVER CRYOABLATION  07/23/2019  . IR RADIOLOGIST EVAL & MGMT  07/18/2019  . TONSILLECTOMY AND ADENOIDECTOMY    . TUBAL LIGATION      Prior to Admission medications   Medication Sig Start  Date End Date Taking? Authorizing Provider  albuterol (VENTOLIN HFA) 108 (90 Base) MCG/ACT inhaler USE 2 INHALATIONS BY MOUTH  EVERY 6 HOURS AS NEEDED FOR WHEEZING OR SHORTNESS OF  BREATH Patient taking differently: Inhale 2 puffs into the lungs every 6 (six) hours as needed for shortness of breath.  02/05/20  Yes Madalyn Rob, MD  aspirin EC 81 MG tablet Take 81 mg by mouth daily.   Yes [provider]  atorvastatin (LIPITOR) 40 MG tablet Take 1 tablet (40 mg total) by mouth daily. 12/24/19  Yes Christian, Rylee, MD  benazepril (LOTENSIN) 40 MG tablet Take 1 tablet (40 mg total) by mouth daily. 12/24/19 12/23/20 Yes Christian, Rylee, MD  esomeprazole (NEXIUM) 40 MG capsule Take 1 capsule (40 mg total) by mouth daily as needed (for heartburn or indigestion). Patient taking differently: Take 40 mg by mouth daily.  01/20/19  Yes Marcelyn Bruins, MD  Fluticasone-Salmeterol (ADVAIR DISKUS) 250-50 MCG/DOSE AEPB Inhale 1 puff into the lungs 2 (two) times daily. Rinse mouth after each use 12/05/19  Yes Christian, Rylee, MD  gabapentin (NEURONTIN) 400 MG capsule TAKE 1 CAPSULE BY MOUTH  TWICE DAILY Patient taking differently: Take 400 mg by mouth 2 (two) times daily.  02/05/20  Yes Madalyn Rob, MD  hydrALAZINE (APRESOLINE) 25 MG tablet Take 1 tablet (25 mg total) by mouth 3 (three) times daily.  Patient taking differently: Take 25 mg by mouth daily as needed (As need for high blood pressure).  10/09/19  Yes Mosetta Anis, MD  ibuprofen (ADVIL) 200 MG tablet Take 800 mg by mouth every 6 (six) hours as needed (Swelling).   Yes [provider]  metoprolol succinate (TOPROL-XL) 100 MG 24 hr tablet TAKE 1 TABLET(100 MG) BY MOUTH DAILY WITH OR IMMEDIATELY FOLLOWING A MEAL Patient taking differently: Take 100 mg by mouth daily as needed (High blood pressure).  10/07/19  Yes [place]field, Carley D, DO  mirabegron ER (MYRBETRIQ) 50 MG TB24 tablet Take 50 mg by mouth daily.   Yes [provider]   oxyCODONE-acetaminophen (PERCOCET/ROXICET) 5-325 MG tablet Take 1 tablet by mouth daily as needed for severe pain. 09/26/18  Yes Marcelyn Bruins, MD  rivaroxaban (XARELTO) 20 MG TABS tablet Take 1 tablet (20 mg total) by mouth every morning. 01/23/20  Yes Iona Beard, MD  tiotropium (SPIRIVA HANDIHALER) 18 MCG inhalation capsule PLACE 1 CAPSULE INTO INHALER AND INHALE DAILY Patient taking differently: Place 18 mcg into inhaler and inhale daily.  12/05/19  Yes Christian, Rylee, MD  torsemide (DEMADEX) 20 MG tablet TAKE 2 TABLETS BY MOUTH DAILY Patient taking differently: Take 40 mg by mouth daily.  01/26/20  Yes Bloomfield, Carley D, DO  albuterol (PROVENTIL) (2.5 MG/3ML) 0.083% nebulizer solution TAKE 3 MLS(1 AMPULE) BY NEBULIZATION EVERY 4 HOURS AS NEEDED FOR [place] BREATH Patient taking differently: Take 2.5 mg by nebulization every 4 (four) hours as needed for wheezing or shortness of breath.  01/20/19   Marcelyn Bruins, MD  potassium chloride (KLOR-CON) 10 MEQ tablet TAKE 2 TABLETS(20 MEQ) BY MOUTH TWICE DAILY Patient taking differently: Take 20 mEq by mouth 2 (two) times daily as needed (If taking fluid pills).  09/18/19   Rigoberto Noel, MD    Scheduled Meds: Continuous Infusions: . sodium chloride    . lactated ringers     PRN Meds:.  Allergies as of 01/26/2020  . (No Known Allergies)    Family History  Problem Relation Age of Onset  . Breast cancer Mother   . Hypertension Mother   . Hyperlipidemia Mother   . Hypertension Maternal Grandfather   . Hyperlipidemia Maternal Grandfather     Social History   Socioeconomic History  . Marital status: Divorced    Spouse name: Not on file  . Number of children: Not on file  . Years of education: Not on file  . Highest education level: Not on file  Occupational History  . Not on file  Tobacco Use  . Smoking status: Current Some Day Smoker    Packs/day: 0.10    Years: 55.00    Pack years: 5.50     Types: Cigarettes    Start date: 08/16/1961  . Smokeless tobacco: Never Used  . Tobacco comment: 3 cigs per day  Vaping Use  . Vaping Use: Never used  Substance and Sexual Activity  . Alcohol use: No  . Drug use: No  . Sexual activity: Not Currently    Partners: Male  Other Topics Concern  . Not on file  Social History Narrative   Lives in [facility] in [place]. Lives alone, but is dependent in ADLs/IADLs. Previously had [facility] and RN but dismissed them with plans to use the [employer]. Two daughters live nearby. Previously resided in [place].   Social Determinants of Health   Financial Resource Strain: Medium Risk  . Difficulty  of Paying Living Expenses: Somewhat hard  Food Insecurity: No Food Insecurity  . Worried About Charity fundraiser in the Last Year: Never true  . Ran Out of Food in the Last Year: Never true  Transportation Needs: No Transportation Needs  . Lack of Transportation (Medical): No  . Lack of Transportation (Non-Medical): No  Physical Activity:   . Days of Exercise per Week: Not on file  . Minutes of Exercise per Session: Not on file  Stress:   . Feeling of Stress : Not on file  Social Connections:   . Frequency of Communication with Friends and Family: Not on file  . Frequency of Social Gatherings with Friends and Family: Not on file  . Attends Religious Services: Not on file  . Active Member of Clubs or Organizations: Not on file  . Attends Archivist Meetings: Not on file  . Marital Status: Not on file  Intimate Partner Violence:   . Fear of Current or Ex-Partner: Not on file  . Emotionally Abused: Not on file  . Physically Abused: Not on file  . Sexually Abused: Not on file     Physical Exam: Vital signs: Vitals:   03/09/20 0741  BP: 125/64  Resp: (!) 22  Temp: 98.6 F (37 C)  SpO2: 99%     General:   Morbidly obese, not in acute distress Lungs:  Clear throughout to auscultation.  No acute distress. Heart:  Regular  rate and rhythm; no murmurs, clicks, rubs,  or gallops. Abdomen: Soft, nontender nondistended, bowel sounds present.  No peritoneal signs Rectal:  Deferred  GI:  Lab Results: No results for input(s): WBC, HGB, HCT, PLT in the last 72 hours. BMET No results for input(s): NA, K, CL, CO2, GLUCOSE, BUN, CREATININE, CALCIUM in the last 72 hours. LFT No results for input(s): PROT, ALBUMIN, AST, ALT, ALKPHOS, BILITOT, BILIDIR, IBILI in the last 72 hours. PT/INR No results for input(s): LABPROT, INR in the last 72 hours.   Studies/Results: No results found.  Impression/Plan: -Abnormal barium swallow showing distal esophageal stricture -Esophageal dysphagia -Atrial fibrillation.  Anticoagulation on hold for last 2 days  Recommendations ------------------------ -Proceed with EGD with dilation today.  Risks (bleeding, infection, bowel perforation that could require surgery, sedation-related changes in cardiopulmonary systems), benefits (identification and possible treatment of source of symptoms, exclusion of certain causes of symptoms), and alternatives (watchful waiting, radiographic imaging studies, empiric medical treatment)  were explained to patient in detail and patient wishes to proceed.    LOS: 0 days   Otis Brace  MD, FACP 03/09/2020, 8:26 AM  Contact #  [phone]

## 2020-03-10 ENCOUNTER — Ambulatory Visit: Payer: Medicare Other

## 2020-03-10 ENCOUNTER — Telehealth: Payer: Medicare Other

## 2020-03-10 LAB — SURGICAL PATHOLOGY

## 2020-03-11 ENCOUNTER — Ambulatory Visit: Payer: Medicare Other | Admitting: *Deleted

## 2020-03-11 DIAGNOSIS — I10 Essential (primary) hypertension: Secondary | ICD-10-CM

## 2020-03-11 DIAGNOSIS — M170 Bilateral primary osteoarthritis of knee: Secondary | ICD-10-CM | POA: Diagnosis not present

## 2020-03-11 DIAGNOSIS — R1319 Other dysphagia: Secondary | ICD-10-CM

## 2020-03-11 DIAGNOSIS — J449 Chronic obstructive pulmonary disease, unspecified: Secondary | ICD-10-CM

## 2020-03-11 DIAGNOSIS — I5032 Chronic diastolic (congestive) heart failure: Secondary | ICD-10-CM

## 2020-03-11 DIAGNOSIS — M7989 Other specified soft tissue disorders: Secondary | ICD-10-CM | POA: Diagnosis not present

## 2020-03-11 DIAGNOSIS — R32 Unspecified urinary incontinence: Secondary | ICD-10-CM

## 2020-03-11 DIAGNOSIS — Z79899 Other long term (current) drug therapy: Secondary | ICD-10-CM | POA: Diagnosis not present

## 2020-03-11 DIAGNOSIS — I480 Paroxysmal atrial fibrillation: Secondary | ICD-10-CM

## 2020-03-11 NOTE — Chronic Care Management (AMB) (Signed)
Chronic Care Management   Follow Up Note   03/11/2020 Name: Sharon Vasquez [name]: [id number] DOB: 10/24/1950  Referred by: Andrew Au, MD Reason for referral : Chronic Care Management (HTN, Asthma, OSA, Seizures, Anxiety, Depression, Urinary Incontinence)   Sharon Vasquez is a 69 y.o. year old female who is a primary care patient of [name], [name], MD. The CCM team was consulted for assistance with chronic disease management and care coordination needs.    Review of patient status, including review of consultants reports, relevant laboratory and other test results, and collaboration with appropriate care team members and the patient's provider was performed as part of comprehensive patient evaluation and provision of chronic care management services.    SDOH (Social Determinants of Health) assessments performed: No See Care Plan activities for detailed interventions related to [facility])     Outpatient Encounter Medications as of 03/11/2020  Medication Sig Note  . albuterol (PROVENTIL) (2.5 MG/3ML) 0.083% nebulizer solution TAKE 3 MLS(1 AMPULE) BY NEBULIZATION EVERY 4 HOURS AS NEEDED FOR WHHEZING OR SHORTNESS OF BREATH (Patient taking differently: Take 2.5 mg by nebulization every 4 (four) hours as needed for wheezing or shortness of breath. ) 06/24/2019: Use once every two weeks  . albuterol (VENTOLIN HFA) 108 (90 Base) MCG/ACT inhaler USE 2 INHALATIONS BY MOUTH  EVERY 6 HOURS AS NEEDED FOR WHEEZING OR SHORTNESS OF  BREATH (Patient taking differently: Inhale 2 puffs into the lungs every 6 (six) hours as needed for shortness of breath. )   . aspirin EC 81 MG tablet Take 81 mg by mouth daily.   [name] atorvastatin (LIPITOR) 40 MG tablet Take 1 tablet (40 mg total) by mouth daily.   . benazepril (LOTENSIN) 40 MG tablet Take 1 tablet (40 mg total) by mouth daily.   [name] esomeprazole (NEXIUM) 40 MG capsule Take 1 capsule (40 mg total) by mouth daily as needed (for heartburn or indigestion). (Patient taking  differently: Take 40 mg by mouth daily. )   . Fluticasone-Salmeterol (ADVAIR DISKUS) 250-50 MCG/DOSE AEPB Inhale 1 puff into the lungs 2 (two) times daily. Rinse mouth after each use   . gabapentin (NEURONTIN) 400 MG capsule TAKE 1 CAPSULE BY MOUTH  TWICE DAILY (Patient taking differently: Take 400 mg by mouth 2 (two) times daily. )   . hydrALAZINE (APRESOLINE) 25 MG tablet Take 1 tablet (25 mg total) by mouth 3 (three) times daily. (Patient taking differently: Take 25 mg by mouth daily as needed (As need for high blood pressure). )   . ibuprofen (ADVIL) 200 MG tablet Take 800 mg by mouth every 6 (six) hours as needed (Swelling).   . metoprolol succinate (TOPROL-XL) 100 MG 24 hr tablet TAKE 1 TABLET(100 MG) BY MOUTH DAILY WITH OR IMMEDIATELY FOLLOWING A MEAL (Patient taking differently: Take 100 mg by mouth daily as needed (High blood pressure). )   . mirabegron ER (MYRBETRIQ) 50 MG TB24 tablet Take 50 mg by mouth daily. 11/20/2019: Patient states she received 30 day sample from urologist on 11/20/19  . oxyCODONE-acetaminophen (PERCOCET/ROXICET) 5-325 MG tablet Take 1 tablet by mouth daily as needed for severe pain. 06/24/2019: Takes every 6 hours round the clock  . potassium chloride (KLOR-CON) 10 MEQ tablet TAKE 2 TABLETS(20 MEQ) BY MOUTH TWICE DAILY (Patient taking differently: Take 20 mEq by mouth 2 (two) times daily as needed (If taking fluid pills). )   . rivaroxaban (XARELTO) 20 MG TABS tablet Take 1 tablet (20 mg total) by mouth every morning.   [name]  tiotropium (SPIRIVA HANDIHALER) 18 MCG inhalation capsule PLACE 1 CAPSULE INTO INHALER AND INHALE DAILY (Patient taking differently: Place 18 mcg into inhaler and inhale daily. )   . torsemide (DEMADEX) 20 MG tablet TAKE 2 TABLETS BY MOUTH DAILY (Patient taking differently: Take 40 mg by mouth daily. )    No facility-administered encounter medications on file as of 03/11/2020.     Objective:  Wt Readings from Last 3 Encounters:  03/09/20 (!) 349 lb  (158.3 kg)  02/12/20 (!) 359 lb 3.2 oz (162.9 kg)  12/25/19 (!) 374 lb 3.2 oz (169.7 kg)   BP Readings from Last 3 Encounters:  03/09/20 129/85  02/12/20 102/78  12/25/19 112/80   Lab Results  Component Value Date   CHOL 154 07/19/2017   HDL 48 07/19/2017   LDLCALC 86 07/19/2017   TRIG 99 07/19/2017   CHOLHDL 3.2 07/19/2017    Goals Addressed              This Visit's Progress     Patient Stated   .  "I try to do what my doctors tell me to do" (pt-stated)        [place] (see longitudinal plan of care for additional care plan information)  Current Barriers:  . Chronic Disease Management support, education, and care coordination needs related to Atrial Fibrillation, CHF, HTN, and COPD- spoke with patient via phone, states she is doing well, received her Covid Booster today. She states her pulse was a little high today 120 but by the time she got home form having her booster shot it was back to baseline, states her room air saturation are in mid to high 90's   Clinical Goal(s) related to Atrial Fibrillation, CHF, HTN, and COPD:  Over the next 30 days, patient will:  . Work with the care management team to address educational, disease management, and care coordination needs  . Begin or continue self health monitoring activities as directed today Measure and record blood pressure 5-7 times per week and Measure and record weight daily . Call provider office for new or worsened signs and symptoms Blood pressure findings outside established parameters, Weight outside established parameters, Shortness of breath, and New or worsened symptom related to CHF and COPD . Call care management team with questions or concerns . Verbalize basic understanding of patient centered plan of care established today  Interventions related to Atrial Fibrillation, CHF, HTN, and COPD:  . Appropriate assessments completed . Reviewed medications with patient and assessed medication taking  behavior;  if indicated, discussed importance of medication adherence  . Discussed plans with patient for ongoing care management follow up and ensured patient has contact number for CCM team and clinic . Wished  patient Merry Christmas   Patient Self Care Activities related to Atrial Fibrillation, CHF, HTN, and COPD:  . Patient is unable to independently self-manage chronic health conditions  Please see past updates related to this goal by clicking on the "Past Updates" button in the selected goal      .  COMPLETED: "They want to put a balloon down my throat, I don't understand what for. Please call me" (pt-stated)        CARE PLAN ENTRY (see longitudinal plan of care for additional care plan information)  Current Barriers:  [name] Knowledge Deficits related to esophageal dilation procedure- procedure scheduled for 03/09/20- spoke with patient via phone, she states she tolerated the esophageal dilatation on 03/09/20 without problems, denies sore throat, hoarseness, difficulty swallowing  or breathing, she says she was not told she needs a follow up with the gastroenterologist   Nurse Case Manager Clinical Goal(s):  [name] Over the next 7-14 days, patient will demonstrate understanding of rationale for esophageal dilation as evidenced by voicing understanding of why there procedure is done, what the expected result of the procedure is, what happens before during and after the procedure, what care is needed at home post procedure,  post procedure problems requiring MD notification , and post procedure treatment   Interventions:  . Inter-disciplinary care team collaboration (see longitudinal plan of care) . Provided education to patient via reading patient the [facility] education handout "esophageal dilation; and offering to e-mail it to her  . Encouraged patient to write down questions or concerns to discuss her gastroenterologist prior to the procedure  . Left message for patient on 02/17/20 advising her this  CCM RN is aware of procedure scheduled for 03/09/20 and encouraged patient to call this CCM RN for questions or concerns. Will plan to call patient post procedure.  . 03/11/20- Assessed patient's post procedure recovery  Patient Self Care Activities:  . Patient verbalizes understanding of plan to speak with gastroenterologist to address patient's questions and/or concerns related to the procedure . Unable to independently understand esophageal dilation procedure  Please see past updates related to this goal by clicking on the "Past Updates" button in the selected goal           Plan:   The care management team will reach out to the patient again over the next 30-60 days.    Kelli Churn RN, CCM, [facility] RN Care Manager [phone]

## 2020-03-11 NOTE — Progress Notes (Signed)
Internal Medicine Clinic Resident  I have personally reviewed this encounter including the documentation in this note and/or discussed this patient with the care management provider. I will address any urgent items identified by the care management provider and will communicate my actions to the patient's PCP. I have reviewed the patient's CCM visit with my supervising attending, Dr Evette Doffing.  Foy Guadal[place], MD 03/11/2020

## 2020-03-11 NOTE — Patient Instructions (Signed)
Visit Information It was nice speaking with you today. I'm glad you tolerated the endoscopy and esophageal dilatation well.  Goals Addressed              This Visit's Progress     Patient Stated   .  "I try to do what my doctors tell me to do" (pt-stated)        [place] (see longitudinal plan of care for additional care plan information)  Current Barriers:  . Chronic Disease Management support, education, and care coordination needs related to Atrial Fibrillation, CHF, HTN, and COPD- spoke with patient via phone, states she is doing well, received her Covid Booster today. She states her pulse was a little high today 120 but by the time she got home form having her booster shot it was back to baseline, states her room air saturation are in mid to high 90's   Clinical Goal(s) related to Atrial Fibrillation, CHF, HTN, and COPD:  Over the next 30 days, patient will:  . Work with the care management team to address educational, disease management, and care coordination needs  . Begin or continue self health monitoring activities as directed today Measure and record blood pressure 5-7 times per week and Measure and record weight daily . Call provider office for new or worsened signs and symptoms Blood pressure findings outside established parameters, Weight outside established parameters, Shortness of breath, and New or worsened symptom related to CHF and COPD . Call care management team with questions or concerns . Verbalize basic understanding of patient centered plan of care established today  Interventions related to Atrial Fibrillation, CHF, HTN, and COPD:  . Appropriate assessments completed . Reviewed medications with patient and assessed medication taking behavior;  if indicated, discussed importance of medication adherence  . Discussed plans with patient for ongoing care management follow up and ensured patient has contact number for CCM team and clinic . Wished  patient  Merry Christmas   Patient Self Care Activities related to Atrial Fibrillation, CHF, HTN, and COPD:  . Patient is unable to independently self-manage chronic health conditions  Please see past updates related to this goal by clicking on the "Past Updates" button in the selected goal      .  COMPLETED: "They want to put a balloon down my throat, I don't understand what for. Please call me" (pt-stated)        CARE PLAN ENTRY (see longitudinal plan of care for additional care plan information)  Current Barriers:  [name] Knowledge Deficits related to esophageal dilation procedure- procedure scheduled for 03/09/20- spoke with patient via phone, she states she tolerated the esophageal dilatation on 03/09/20 without problems, denies sore throat, hoarseness, difficulty swallowing or breathing, she says she was not told she needs a follow up with the gastroenterologist   Nurse Case Manager Clinical Goal(s):  [name] Over the next 7-14 days, patient will demonstrate understanding of rationale for esophageal dilation as evidenced by voicing understanding of why there procedure is done, what the expected result of the procedure is, what happens before during and after the procedure, what care is needed at home post procedure,  post procedure problems requiring MD notification , and post procedure treatment   Interventions:  . Inter-disciplinary care team collaboration (see longitudinal plan of care) . Provided education to patient via reading patient the [facility] education handout "esophageal dilation; and offering to e-mail it to her  . Encouraged patient to write down questions or concerns to discuss her  gastroenterologist prior to the procedure  . Left message for patient on 02/17/20 advising her this CCM RN is aware of procedure scheduled for 03/09/20 and encouraged patient to call this CCM RN for questions or concerns. Will plan to call patient post procedure.  . 03/11/20- Assessed patient's post procedure  recovery  Patient Self Care Activities:  . Patient verbalizes understanding of plan to speak with gastroenterologist to address patient's questions and/or concerns related to the procedure . Unable to independently understand esophageal dilation procedure  Please see past updates related to this goal by clicking on the "Past Updates" button in the selected goal         The patient verbalized understanding of instructions, educational materials, and care plan provided today and declined offer to receive copy of patient instructions, educational materials, and care plan.   The care management team will reach out to the patient again over the next 30-60 days.   Kelli Churn RN, CCM, [facility] RN Care Manager [phone]

## 2020-03-12 ENCOUNTER — Encounter (HOSPITAL_COMMUNITY): Payer: Self-pay | Admitting: Gastroenterology

## 2020-03-12 NOTE — Progress Notes (Signed)
Internal Medicine Clinic Attending  CCM services provided by the care management provider and their documentation were discussed with Dr.  Wynetta Emery . We reviewed the pertinent findings, urgent action items addressed by the resident and non-urgent items to be addressed by the PCP.  I agree with the assessment, diagnosis, and plan of care documented in the CCM and resident's note.  Axel Filler, MD 03/12/2020

## 2020-03-12 NOTE — Anesthesia Postprocedure Evaluation (Signed)
Anesthesia Post Note  Patient: Sharon Vasquez  Procedure(s) Performed: ESOPHAGOGASTRODUODENOSCOPY (EGD) WITH PROPOFOL (N/A ) BALLOON DILATION (N/A ) BIOPSY     Patient location during evaluation: Endoscopy Anesthesia Type: MAC Level of consciousness: awake and alert Pain management: pain level controlled Vital Signs Assessment: post-procedure vital signs reviewed and stable Respiratory status: spontaneous breathing, nonlabored ventilation, respiratory function stable and patient connected to nasal cannula oxygen Cardiovascular status: stable and blood pressure returned to baseline Postop Assessment: no apparent nausea or vomiting Anesthetic complications: no   No complications documented.  Last Vitals:  Vitals:   03/09/20 0910 03/09/20 0920  BP: 105/82 129/85  Pulse: 83 85  Resp: 17 20  Temp:    SpO2: 100% 99%    Last Pain:  Vitals:   03/09/20 0920  TempSrc:   PainSc: 0-No pain                 Joah Patlan

## 2020-03-18 NOTE — Progress Notes (Signed)
Internal Medicine Clinic Attending  CCM services provided by the care management provider and their documentation were reviewed with Dr. Alfonse Spruce.  We reviewed the pertinent findings, urgent action items addressed by the resident and non-urgent items to be addressed by the PCP.  I agree with the assessment, diagnosis, and plan of care documented in the CCM and resident's note.  Oda Kilts, MD 03/18/2020

## 2020-03-25 DIAGNOSIS — Z719 Counseling, unspecified: Secondary | ICD-10-CM | POA: Diagnosis not present

## 2020-04-05 DIAGNOSIS — M25562 Pain in left knee: Secondary | ICD-10-CM | POA: Diagnosis not present

## 2020-04-05 DIAGNOSIS — M25561 Pain in right knee: Secondary | ICD-10-CM | POA: Diagnosis not present

## 2020-04-05 DIAGNOSIS — M170 Bilateral primary osteoarthritis of knee: Secondary | ICD-10-CM | POA: Diagnosis not present

## 2020-04-09 ENCOUNTER — Ambulatory Visit: Payer: Medicare Other | Admitting: *Deleted

## 2020-04-09 DIAGNOSIS — I10 Essential (primary) hypertension: Secondary | ICD-10-CM

## 2020-04-09 DIAGNOSIS — J449 Chronic obstructive pulmonary disease, unspecified: Secondary | ICD-10-CM

## 2020-04-09 DIAGNOSIS — M170 Bilateral primary osteoarthritis of knee: Secondary | ICD-10-CM

## 2020-04-09 DIAGNOSIS — R32 Unspecified urinary incontinence: Secondary | ICD-10-CM

## 2020-04-09 DIAGNOSIS — I5032 Chronic diastolic (congestive) heart failure: Secondary | ICD-10-CM

## 2020-04-09 DIAGNOSIS — G894 Chronic pain syndrome: Secondary | ICD-10-CM

## 2020-04-09 DIAGNOSIS — I480 Paroxysmal atrial fibrillation: Secondary | ICD-10-CM

## 2020-04-09 NOTE — Chronic Care Management (AMB) (Addendum)
  Chronic Care Management   Note  04/09/2020 Name: Sharon Vasquez [name]: [id number] DOB: 05-Dec-1950  Returned call to patient after patient left two messages earlier today for this CCM RN  requesting information on securing personal assistance in the home. Patient states she is having great difficulty walking due to chronic bilateral knee pain. She says she is not a candidate for total knee replacement per Dr Berenice Primas at [place] and she is currently taking a 12 day steroid burst to manage the inflammation. She denied offer of home health physical therapy. She says her electric scooter is too big to use in her apartment, so she is using a [employer] w/c although she has difficulty with foot rests on the w/c because she wears a size 12 shoe.  She says her daughter brought her an electric hospital bed with eggcrate overlay and placed It in her bedroom.  Discussed options with patient;  she does not qualify for full Medicaid as her monthly income of $1300 is over the limit, so personal care services that are paid by Medicaid would have to be private pay. She states she has looked into assisted living facilities but cannot afford the monthly fees.   Advised her to call her insurance broker and ask if her [facility] health plan offers any benefits related to help in the home. Patient voiced much appreciation for this CCM RN"S help and direction.   Follow up plan: The care management team will reach out to the patient again over the next 30 days.   Kelli Churn RN, CCM, [facility] RN Care Manager [phone]

## 2020-04-10 ENCOUNTER — Inpatient Hospital Stay (HOSPITAL_COMMUNITY)
Admission: EM | Admit: 2020-04-10 | Discharge: 2020-04-15 | DRG: 190 | Disposition: A | Discharge status: Skilled Nursing Facility | Payer: Medicare Other | Attending: Student in an Organized Health Care Education/Training Program | Admitting: Student in an Organized Health Care Education/Training Program

## 2020-04-10 ENCOUNTER — Other Ambulatory Visit: Payer: Self-pay

## 2020-04-10 ENCOUNTER — Emergency Department (HOSPITAL_COMMUNITY): Payer: Medicare Other

## 2020-04-10 DIAGNOSIS — Z86718 Personal history of other venous thrombosis and embolism: Secondary | ICD-10-CM

## 2020-04-10 DIAGNOSIS — M6281 Muscle weakness (generalized): Secondary | ICD-10-CM | POA: Diagnosis not present

## 2020-04-10 DIAGNOSIS — J810 Acute pulmonary edema: Secondary | ICD-10-CM | POA: Diagnosis not present

## 2020-04-10 DIAGNOSIS — I10 Essential (primary) hypertension: Secondary | ICD-10-CM | POA: Diagnosis not present

## 2020-04-10 DIAGNOSIS — R0902 Hypoxemia: Secondary | ICD-10-CM | POA: Diagnosis not present

## 2020-04-10 DIAGNOSIS — I499 Cardiac arrhythmia, unspecified: Secondary | ICD-10-CM | POA: Diagnosis not present

## 2020-04-10 DIAGNOSIS — J441 Chronic obstructive pulmonary disease with (acute) exacerbation: Secondary | ICD-10-CM | POA: Diagnosis not present

## 2020-04-10 DIAGNOSIS — Z79899 Other long term (current) drug therapy: Secondary | ICD-10-CM

## 2020-04-10 DIAGNOSIS — Z86711 Personal history of pulmonary embolism: Secondary | ICD-10-CM

## 2020-04-10 DIAGNOSIS — R0602 Shortness of breath: Secondary | ICD-10-CM | POA: Diagnosis not present

## 2020-04-10 DIAGNOSIS — E785 Hyperlipidemia, unspecified: Secondary | ICD-10-CM | POA: Diagnosis present

## 2020-04-10 DIAGNOSIS — F1120 Opioid dependence, uncomplicated: Secondary | ICD-10-CM | POA: Diagnosis present

## 2020-04-10 DIAGNOSIS — Z7952 Long term (current) use of systemic steroids: Secondary | ICD-10-CM

## 2020-04-10 DIAGNOSIS — Z743 Need for continuous supervision: Secondary | ICD-10-CM | POA: Diagnosis not present

## 2020-04-10 DIAGNOSIS — Z20822 Contact with and (suspected) exposure to covid-19: Secondary | ICD-10-CM | POA: Diagnosis not present

## 2020-04-10 DIAGNOSIS — Z7401 Bed confinement status: Secondary | ICD-10-CM | POA: Diagnosis not present

## 2020-04-10 DIAGNOSIS — I5030 Unspecified diastolic (congestive) heart failure: Secondary | ICD-10-CM | POA: Diagnosis present

## 2020-04-10 DIAGNOSIS — G894 Chronic pain syndrome: Secondary | ICD-10-CM | POA: Diagnosis not present

## 2020-04-10 DIAGNOSIS — I110 Hypertensive heart disease with heart failure: Secondary | ICD-10-CM | POA: Diagnosis present

## 2020-04-10 DIAGNOSIS — J449 Chronic obstructive pulmonary disease, unspecified: Secondary | ICD-10-CM | POA: Diagnosis present

## 2020-04-10 DIAGNOSIS — I517 Cardiomegaly: Secondary | ICD-10-CM | POA: Diagnosis not present

## 2020-04-10 DIAGNOSIS — I160 Hypertensive urgency: Secondary | ICD-10-CM | POA: Diagnosis not present

## 2020-04-10 DIAGNOSIS — I4891 Unspecified atrial fibrillation: Secondary | ICD-10-CM | POA: Diagnosis not present

## 2020-04-10 DIAGNOSIS — Z6841 Body Mass Index (BMI) 40.0 and over, adult: Secondary | ICD-10-CM

## 2020-04-10 DIAGNOSIS — Z83438 Family history of other disorder of lipoprotein metabolism and other lipidemia: Secondary | ICD-10-CM

## 2020-04-10 DIAGNOSIS — G4733 Obstructive sleep apnea (adult) (pediatric): Secondary | ICD-10-CM | POA: Diagnosis present

## 2020-04-10 DIAGNOSIS — I739 Peripheral vascular disease, unspecified: Secondary | ICD-10-CM | POA: Diagnosis present

## 2020-04-10 DIAGNOSIS — E876 Hypokalemia: Secondary | ICD-10-CM | POA: Diagnosis not present

## 2020-04-10 DIAGNOSIS — I959 Hypotension, unspecified: Secondary | ICD-10-CM | POA: Diagnosis not present

## 2020-04-10 DIAGNOSIS — E861 Hypovolemia: Secondary | ICD-10-CM | POA: Diagnosis not present

## 2020-04-10 DIAGNOSIS — J84115 Respiratory bronchiolitis interstitial lung disease: Secondary | ICD-10-CM | POA: Diagnosis present

## 2020-04-10 DIAGNOSIS — F17210 Nicotine dependence, cigarettes, uncomplicated: Secondary | ICD-10-CM | POA: Diagnosis present

## 2020-04-10 DIAGNOSIS — Z7982 Long term (current) use of aspirin: Secondary | ICD-10-CM

## 2020-04-10 DIAGNOSIS — J8417 Interstitial lung disease with progressive fibrotic phenotype in diseases classified elsewhere: Secondary | ICD-10-CM | POA: Diagnosis not present

## 2020-04-10 DIAGNOSIS — K219 Gastro-esophageal reflux disease without esophagitis: Secondary | ICD-10-CM | POA: Diagnosis present

## 2020-04-10 DIAGNOSIS — R609 Edema, unspecified: Secondary | ICD-10-CM | POA: Diagnosis not present

## 2020-04-10 DIAGNOSIS — M170 Bilateral primary osteoarthritis of knee: Secondary | ICD-10-CM | POA: Diagnosis not present

## 2020-04-10 DIAGNOSIS — I5020 Unspecified systolic (congestive) heart failure: Secondary | ICD-10-CM | POA: Diagnosis present

## 2020-04-10 DIAGNOSIS — J9601 Acute respiratory failure with hypoxia: Secondary | ICD-10-CM | POA: Diagnosis not present

## 2020-04-10 DIAGNOSIS — F32A Depression, unspecified: Secondary | ICD-10-CM | POA: Diagnosis present

## 2020-04-10 DIAGNOSIS — R2681 Unsteadiness on feet: Secondary | ICD-10-CM | POA: Diagnosis not present

## 2020-04-10 DIAGNOSIS — Z7901 Long term (current) use of anticoagulants: Secondary | ICD-10-CM | POA: Diagnosis not present

## 2020-04-10 DIAGNOSIS — J811 Chronic pulmonary edema: Secondary | ICD-10-CM | POA: Diagnosis not present

## 2020-04-10 DIAGNOSIS — I482 Chronic atrial fibrillation, unspecified: Secondary | ICD-10-CM | POA: Diagnosis not present

## 2020-04-10 DIAGNOSIS — Z9114 Patient's other noncompliance with medication regimen: Secondary | ICD-10-CM

## 2020-04-10 DIAGNOSIS — I5032 Chronic diastolic (congestive) heart failure: Secondary | ICD-10-CM | POA: Diagnosis not present

## 2020-04-10 DIAGNOSIS — I5031 Acute diastolic (congestive) heart failure: Secondary | ICD-10-CM | POA: Diagnosis present

## 2020-04-10 DIAGNOSIS — F419 Anxiety disorder, unspecified: Secondary | ICD-10-CM | POA: Diagnosis present

## 2020-04-10 DIAGNOSIS — Z79891 Long term (current) use of opiate analgesic: Secondary | ICD-10-CM

## 2020-04-10 DIAGNOSIS — M2550 Pain in unspecified joint: Secondary | ICD-10-CM | POA: Diagnosis not present

## 2020-04-10 DIAGNOSIS — E872 Acidosis: Secondary | ICD-10-CM | POA: Diagnosis not present

## 2020-04-10 DIAGNOSIS — Z8249 Family history of ischemic heart disease and other diseases of the circulatory system: Secondary | ICD-10-CM

## 2020-04-10 DIAGNOSIS — R6889 Other general symptoms and signs: Secondary | ICD-10-CM | POA: Diagnosis not present

## 2020-04-10 LAB — COMPREHENSIVE METABOLIC PANEL
ALT: 28 U/L (ref 0–44)
AST: 33 U/L (ref 15–41)
Albumin: 4 g/dL (ref 3.5–5.0)
Alkaline Phosphatase: 86 U/L (ref 38–126)
Anion gap: 14 (ref 5–15)
BUN: 17 mg/dL (ref 8–23)
CO2: 21 mmol/L — ABNORMAL LOW (ref 22–32)
Calcium: 9 mg/dL (ref 8.9–10.3)
Chloride: 106 mmol/L (ref 98–111)
Creatinine, Ser: 1.12 mg/dL — ABNORMAL HIGH (ref 0.44–1.00)
GFR, Estimated: 53 mL/min — ABNORMAL LOW (ref >60)
Glucose, Bld: 289 mg/dL — ABNORMAL HIGH (ref 70–99)
Potassium: 3.5 mmol/L (ref 3.5–5.1)
Sodium: 141 mmol/L (ref 135–145)
Total Bilirubin: 0.9 mg/dL (ref 0.3–1.2)
Total Protein: 7.5 g/dL (ref 6.5–8.1)

## 2020-04-10 LAB — TROPONIN I (HIGH SENSITIVITY)
Troponin I (High Sensitivity): 9 ng/L (ref <18)
Troponin I (High Sensitivity): 33 ng/L — ABNORMAL HIGH (ref <18)

## 2020-04-10 LAB — CBC WITH DIFFERENTIAL/PLATELET
Abs Immature Granulocytes: 0.06 10*3/uL (ref 0.00–0.07)
Basophils Absolute: 0 10*3/uL (ref 0.0–0.1)
Basophils Relative: 0 %
Eosinophils Absolute: 0.1 10*3/uL (ref 0.0–0.5)
Eosinophils Relative: 1 %
HCT: 41.9 % (ref 36.0–46.0)
Hemoglobin: 13.9 g/dL (ref 12.0–15.0)
Immature Granulocytes: 0 %
Lymphocytes Relative: 41 %
Lymphs Abs: 6 10*3/uL — ABNORMAL HIGH (ref 0.7–4.0)
MCH: 29.3 pg (ref 26.0–34.0)
MCHC: 33.2 g/dL (ref 30.0–36.0)
MCV: 88.2 fL (ref 80.0–100.0)
Monocytes Absolute: 0.5 10*3/uL (ref 0.1–1.0)
Monocytes Relative: 3 %
Neutro Abs: 7.8 10*3/uL — ABNORMAL HIGH (ref 1.7–7.7)
Neutrophils Relative %: 55 %
Platelets: 551 10*3/uL — ABNORMAL HIGH (ref 150–400)
RBC: 4.75 MIL/uL (ref 3.87–5.11)
RDW: 14.6 % (ref 11.5–15.5)
WBC: 14.8 10*3/uL — ABNORMAL HIGH (ref 4.0–10.5)
nRBC: 0 % (ref 0.0–0.2)

## 2020-04-10 LAB — RESP PANEL BY RT-PCR (FLU A&B, COVID) ARPGX2
Influenza A by PCR: NEGATIVE
Influenza B by PCR: NEGATIVE
SARS Coronavirus 2 by RT PCR: NEGATIVE

## 2020-04-10 LAB — I-STAT ARTERIAL BLOOD GAS, ED
Acid-base deficit: 4 mmol/L — ABNORMAL HIGH (ref 0.0–2.0)
Bicarbonate: 24.2 mmol/L (ref 20.0–28.0)
Calcium, Ion: 1.24 mmol/L (ref 1.15–1.40)
HCT: 35 % — ABNORMAL LOW (ref 36.0–46.0)
Hemoglobin: 11.9 g/dL — ABNORMAL LOW (ref 12.0–15.0)
O2 Saturation: 87 %
Patient temperature: 98.2
Potassium: 3.5 mmol/L (ref 3.5–5.1)
Sodium: 143 mmol/L (ref 135–145)
TCO2: 26 mmol/L (ref 22–32)
pCO2 arterial: 54.4 mmHg — ABNORMAL HIGH (ref 32.0–48.0)
pH, Arterial: 7.255 — ABNORMAL LOW (ref 7.350–7.450)
pO2, Arterial: 60 mmHg — ABNORMAL LOW (ref 83.0–108.0)

## 2020-04-10 LAB — BRAIN NATRIURETIC PEPTIDE: B Natriuretic Peptide: 672.7 pg/mL — ABNORMAL HIGH (ref 0.0–100.0)

## 2020-04-10 MED ORDER — IPRATROPIUM-ALBUTEROL 0.5-2.5 (3) MG/3ML IN SOLN
3.0000 mL | RESPIRATORY_TRACT | Status: DC
  Administered 2020-04-10 – 2020-04-11 (×4): 3 mL via RESPIRATORY_TRACT
  Filled 2020-04-10 (×4): qty 3

## 2020-04-10 MED ORDER — ACETAMINOPHEN 325 MG PO TABS: 650.0000 mg | ORAL_TABLET | Freq: Four times a day (QID) | ORAL | Status: DC | PRN

## 2020-04-10 MED ORDER — NITROGLYCERIN IN D5W 200-5 MCG/ML-% IV SOLN
0.0000 ug/min | INTRAVENOUS | Status: DC
  Administered 2020-04-10: 5 ug/min via INTRAVENOUS
  Filled 2020-04-10: qty 250

## 2020-04-10 MED ORDER — FUROSEMIDE 10 MG/ML IJ SOLN: 80.0000 mg | Freq: Two times a day (BID) | INTRAMUSCULAR | Status: DC

## 2020-04-10 MED ORDER — PREDNISONE 20 MG PO TABS
40.0000 mg | ORAL_TABLET | Freq: Every day | ORAL | Status: AC
  Administered 2020-04-11 – 2020-04-14 (×4): 40 mg via ORAL
  Filled 2020-04-10 (×4): qty 2

## 2020-04-10 MED ORDER — LEVALBUTEROL HCL 0.63 MG/3ML IN NEBU
0.6300 mg | INHALATION_SOLUTION | Freq: Four times a day (QID) | RESPIRATORY_TRACT | Status: DC | PRN
  Administered 2020-04-11 – 2020-04-12 (×2): 0.63 mg via RESPIRATORY_TRACT
  Filled 2020-04-10 (×2): qty 3

## 2020-04-10 MED ORDER — LORAZEPAM 2 MG/ML IJ SOLN
2.0000 mg | Freq: Once | INTRAMUSCULAR | Status: AC
  Administered 2020-04-10: 2 mg via INTRAVENOUS
  Filled 2020-04-10: qty 1

## 2020-04-10 MED ORDER — DILTIAZEM HCL-DEXTROSE 125-5 MG/125ML-% IV SOLN (PREMIX)
5.0000 mg/h | INTRAVENOUS | Status: DC
  Administered 2020-04-10: 5 mg/h via INTRAVENOUS
  Filled 2020-04-10: qty 125

## 2020-04-10 MED ORDER — ATORVASTATIN CALCIUM 40 MG PO TABS
40.0000 mg | ORAL_TABLET | Freq: Every day | ORAL | Status: DC
  Administered 2020-04-11 – 2020-04-15 (×5): 40 mg via ORAL
  Filled 2020-04-10 (×5): qty 1

## 2020-04-10 MED ORDER — GABAPENTIN 400 MG PO CAPS
400.0000 mg | ORAL_CAPSULE | Freq: Two times a day (BID) | ORAL | Status: DC
Start: 2020-04-10 — End: 2020-04-16
  Administered 2020-04-10 – 2020-04-15 (×11): 400 mg via ORAL
  Filled 2020-04-10 (×11): qty 1

## 2020-04-10 MED ORDER — LACTATED RINGERS IV BOLUS
1000.0000 mL | Freq: Once | INTRAVENOUS | Status: AC
  Administered 2020-04-10: 1000 mL via INTRAVENOUS

## 2020-04-10 MED ORDER — HYDROMORPHONE HCL 1 MG/ML IJ SOLN
0.5000 mg | Freq: Three times a day (TID) | INTRAMUSCULAR | Status: DC
  Administered 2020-04-10 (×2): 0.5 mg via INTRAVENOUS
  Filled 2020-04-10: qty 1
  Filled 2020-04-10: qty 0.5
  Filled 2020-04-10: qty 1

## 2020-04-10 MED ORDER — DILTIAZEM LOAD VIA INFUSION
20.0000 mg | Freq: Once | INTRAVENOUS | Status: AC
  Administered 2020-04-10: 20 mg via INTRAVENOUS
  Filled 2020-04-10: qty 20

## 2020-04-10 MED ORDER — AZITHROMYCIN 250 MG PO TABS
250.0000 mg | ORAL_TABLET | Freq: Every day | ORAL | Status: AC
  Administered 2020-04-11 – 2020-04-14 (×4): 250 mg via ORAL
  Filled 2020-04-10 (×5): qty 1

## 2020-04-10 MED ORDER — ASPIRIN EC 81 MG PO TBEC
81.0000 mg | DELAYED_RELEASE_TABLET | Freq: Every day | ORAL | Status: DC
  Administered 2020-04-11 – 2020-04-15 (×5): 81 mg via ORAL
  Filled 2020-04-10 (×5): qty 1

## 2020-04-10 MED ORDER — IPRATROPIUM-ALBUTEROL 0.5-2.5 (3) MG/3ML IN SOLN: 3.0000 mL | Freq: Four times a day (QID) | RESPIRATORY_TRACT | Status: DC | PRN

## 2020-04-10 MED ORDER — POTASSIUM CHLORIDE CRYS ER 20 MEQ PO TBCR: 40.0000 meq | EXTENDED_RELEASE_TABLET | Freq: Once | ORAL | Status: DC

## 2020-04-10 MED ORDER — POLYETHYLENE GLYCOL 3350 17 G PO PACK: 17.0000 g | PACK | Freq: Every day | ORAL | Status: DC | PRN

## 2020-04-10 MED ORDER — FUROSEMIDE 10 MG/ML IJ SOLN
80.0000 mg | Freq: Once | INTRAMUSCULAR | Status: AC
  Administered 2020-04-10: 80 mg via INTRAVENOUS
  Filled 2020-04-10: qty 8

## 2020-04-10 MED ORDER — PANTOPRAZOLE SODIUM 40 MG PO TBEC
40.0000 mg | DELAYED_RELEASE_TABLET | Freq: Every day | ORAL | Status: DC
  Administered 2020-04-11 – 2020-04-15 (×5): 40 mg via ORAL
  Filled 2020-04-10 (×5): qty 1

## 2020-04-10 MED ORDER — OXYCODONE-ACETAMINOPHEN 5-325 MG PO TABS: 1.0000 | ORAL_TABLET | Freq: Every day | ORAL | Status: DC | PRN

## 2020-04-10 MED ORDER — RIVAROXABAN 20 MG PO TABS
20.0000 mg | ORAL_TABLET | Freq: Every day | ORAL | Status: DC
  Administered 2020-04-11 – 2020-04-15 (×5): 20 mg via ORAL
  Filled 2020-04-10 (×6): qty 1

## 2020-04-10 NOTE — H&P (Addendum)
Date: 04/10/2020               Patient Name:  Sharon Vasquez [name]: [id number]  DOB: 1950-11-22 Age / Sex: 70 y.o., female   PCP: Andrew Au, MD         Medical Service: Internal Medicine Teaching Service         Attending Physician: Dr. Velna Ochs, MD    First Contact: Dr. Edison Simon Pager: [phone]  Second Contact: Dr. Harlow Ohms Pager: [phone]       After Hours (After 5p/  First Contact Pager: [phone]  weekends / holidays): Second Contact Pager: [phone]   Chief Complaint: Respiratory distress  History of Present Illness:   70 year old female with past medical history of COPD, heart failure preserved ejection fraction, hypertension, history of DVT/PE, A. fib on Xarelto, morbidly obesity who presented to the ED for acute respiratory failure with hypoxia.  Limited history obtained from patient due to BiPAP.  Sharon Vasquez states that she was in her usual state of health until yesterday when she began experiencing shortness of breath and weakness.  She endorses chest pain, wheezing, increased dry cough, sneezing, lower extremity edema.  Denies fever, chills.  State that she has not been taking her medications like she is suppose to because of her stomach issue.  States that she has been using her rescue inhaler more frequently in the last few days.  She states that she lives by herself but is trying to relocate.  States that she is need help with ADLs.  In the ED, initial O2 sat was 78% and was placed on CPAP.  Currently on BiPAP setting at 10%.  Initial EKG shows A. fib with RVR, heart rate 180, so diltiazem drip was started.  BP by EMS was 230/160, initial CBC 162/151 , nitroglycerin drip was started but now discontinued.  Shows leukocytosis of 14.8 with hemoglobin of 13.9.  BMP showed normal sodium and potassium with a metabolic acidosis with anion gap, bicarb 21 and AG 14.  Creatinine 1.12 (baseline around 0.9).  BNP elevated at 672 with troponin of 9.  ABG shows  respiratory acidosis.  She received 2 mg of Ativan and 80 mg of IV Lasix.  Meds: No outpatient medications have been marked as taking for the 04/10/20 encounter [facility] Encounter).     Allergies: Allergies as of 04/10/2020  . (No Known Allergies)   Past Medical History:  Diagnosis Date  . A-fib ([place])   . Anxiety   . Arthritis    "qwhre; joints, back" (04/17/2017)  . Benign breast cyst in female, left 01/08/2017   Found by Screening mammogram, evaluated by [facility] on 01/08/17 and determined to be a benign simple breast cyst.  . Cellulitis of left lower leg 05/30/2017  . CHF (congestive heart failure) ([place])   . Chronic low back pain 08/21/2016  . Chronic lower back pain   . Chronic venous insufficiency    [name] 05/30/2017  . COPD (chronic obstructive pulmonary disease) ([place])   . Depression   . DVT (deep venous thrombosis) ([place]) 11/16/2016  . GERD (gastroesophageal reflux disease)   . Headache    "weekly for the last 3 months" (04/17/2017)  . Hyperlipidemia   . Hypertension   . Morbid obesity ([place])   . PE (pulmonary embolism)   . Pulmonary embolism ([place]) 09/21/2014  . Sleep apnea     Family History:  Sisters have history of heart attack  Social History:  Smoke 1  cigarettes a day and chew nicotine gum  Review of Systems: A complete ROS was negative except as per HPI.  Physical Exam: Blood pressure (!) 117/93, pulse 93, temperature 98.2 F (36.8 C), temperature source Oral, resp. rate (!) 25, SpO2 100 %.  Physical Exam Constitutional:      General: She is in acute distress.     Appearance: She is obese. She is ill-appearing.  HENT:     Head: Normocephalic.  Eyes:     General:        Right eye: No discharge.        Left eye: No discharge.  Cardiovascular:     Rate and Rhythm: Tachycardia present. Rhythm irregular.     Comments: Could not appreciate JVD given body habitus Pulmonary:     Effort: Respiratory distress present.     Breath sounds: Wheezing present.      Comments: Could not listen to posterior lung sounds due to body habitus Abdominal:     General: Bowel sounds are normal.     Palpations: Abdomen is soft.     Tenderness: There is no abdominal tenderness.  Musculoskeletal:     Right lower leg: Edema present.     Left lower leg: Edema present.     Comments: +1 LE edema bilaterally with chronic venous stasis dermatosis  Skin:    General: Skin is warm.     EKG: personally reviewed my interpretation is A. fib with RVR  CXR: personally reviewed my interpretation is bilateral opacities consistent with pulmonary vascular congestion  Assessment & Plan by Problem: Principal Problem:   Acute respiratory failure with hypoxia (HCC) Active Problems:   Acute exacerbation of chronic obstructive pulmonary disease (COPD) (HCC)   Acute diastolic (congestive) heart failure (HCC)   Atrial fibrillation with RVR (HCC)   Chronic pain syndrome  70 year old female with past medical history of COPD, heart failure preserved ejection fraction, hypertension, history of DVT/PE, A. fib on Xarelto, morbidly obesity who presented to the ED for acute respiratory failure with hypoxia, likely secondary to an acute heart failure exacerbation versus COPD exacerbation.  Acute respiratory failure with hypoxia Acute heart failure exacerbation versus acute COPD exacerbation Patient presents with acute respiratory distress with hypoxia.  ABG shows respiratory acidosis with low O2.  Patient reports noncompliance with her medications in the last few days given stomach issue.  Given worsening LE edema, elevated BNP with evidence of pulmonary vascular congestion on chest x-ray, suspicion for an acute heart failure exacerbation.  Patient came in with A. fib with RVR which can worsen her heart failure with preserved ejection fraction.  Patient received 80 mg Lasix IV in the ED.  Will monitor urine output. Patient also complains of wheezes and increased cough which she has to use her  rescue inhaler more often, unsure if this is a coexistent COPD exacerbation. -Diurese with Lasix IV 80 mg twice daily.  Monitor her creatinine -Holding metoprolol in the setting of acute heart failure exacerbation -Duo nebs every 6 hours as needed -Albuterol as needed -Start azithromycin and prednisone 40 mg daily -Continue BiPAP, can titrate down as tolerable.  Follow-up ABG   AFib with RVR Patient presents with A. fib with RVR in the 180s.  This is likely secondary to her medication noncompliance.  Diltiazem drip was started in the ED, currently rate control. -Continue diltiazem drip for now, monitor blood pressure closely. -Replete potassium.  Check mag.  Keep K> 4, Mag >2 -Resume Xarelto   Severe symptomatic hypertension  Patient is now off nitroglycerin drip and is on diltiazem drip. Currently holding metoprolol   Hyperlipidemia Resume atorvastatin 40 mg daily   Chronic arthritis Percocet as needed Resume gabapentin 40 mg twice daily   Code: Full DVT: Xarelto  Dispo: Admit patient to Inpatient with expected length of stay greater than 2 midnights.  Signed: Gaylan Gerold, DO 04/10/2020, 6:21 AM  Pager: [phone] After 5pm on weekdays and 1pm on weekends: On Call pager: [phone]

## 2020-04-10 NOTE — Progress Notes (Signed)
   Subjective:   [name] states she began experiencing SOB that was worsened by any movement, including bending over. This gradually worsened over time.   We discussed her medications and if there is any difficulty with taking them. She denies dysphagia but feels her neck is tight. She is able to eat and drink without difficulty.   She has some tightness in the substernal area of her chest. She denies any nausea, abdominal pain.   Objective:  Vital signs in last 24 hours: Vitals:   04/10/20 0419 04/10/20 0452 04/10/20 0530 04/10/20 0700  BP: 113/83 (!) 117/93    Pulse: 88 98 93   Resp: (!) 21 (!) 23 (!) 25 (!) 24  Temp:      TempSrc:      SpO2: 100% 100% 100%    General- laying comfortably in bed on BiPAP, no acute distress Lungs- inspiratory and expiratory wheezing in bilateral lung fields, on BiPAP CV-limited evaluation due to body habitus, no murmurs appreciated Abdomen-no tenderness to palpation, bowel sounds present, nondistended Extremities- no lower extremity pitting edema, nontender to palpation Skin-warm and dry  Assessment/Plan:  Principal Problem:   Acute respiratory failure with hypoxia (HCC) Active Problems:   Acute exacerbation of chronic obstructive pulmonary disease (COPD) (HCC)   Acute diastolic (congestive) heart failure (HCC)   Atrial fibrillation with RVR (HCC)   Chronic pain syndrome   OSA (obstructive sleep apnea)   Hypertensive urgency  70 year old female with past medical history of COPD, heart failure preserved ejection fraction, hypertension, history of DVT/PE, A. fib on Xarelto, morbidly obesity who presented to the ED for acute respiratory failure with hypoxia, likely secondary to an acute heart failure exacerbation versus COPD exacerbation.  Acute hypoxic respiratory failure secondary to COPD exacerbation Patient presented with acute respiratory distress and hypoxia, with diffuse wheezing on exam.  Presumed to be primarily a COPD  exacerbation.  She was given 1 dose of IV furosemide in the ED and appears hypovolemic on exam and now hypotensive.  Plan to treat as COPD exacerbation with scheduled duo nebs, azithromycin and prednisone.  Currently on BiPAP, will wean as tolerated.   -Duo nebs every 6 hours as needed -Albuterol as needed -Continue azithromycin and prednisone 40 mg daily -Continue BiPAP, can titrate down as tolerable  Hypotension Patient presented with symptomatic hypertension with systolics in the 941D however now is hypotensive with systolic in the 40C to 14G.  Discontinued her nitroglycerin and diltiazem drips and ordered a 1 L LR bolus.  Blood pressure has since improved.  We will continue to monitor.    AFib with RVR Presented with a fib with RVR, heart rate in the 180s.  Started on a diltiazem drip with significant improvement in rate.  Diltiazem drip was discontinued this morning due to hypotension.  Also initially held Toprol in the setting of possible acute heart failure exacerbation.  Plan to resume metoprolol once pressures improved. - Continue cardiac monitoring - Keep K> 4, Mag >2 - Continue Xarelto  HFpEF BNP was elevated on admission however she appears hypovolemic on exam.  Discontinued IV diuretics.  Given 1 L LR bolus due to hypotension.  We will continue to closely monitor volume status.  Holding home metoprolol and benazepril.    Prior to Admission Living Arrangement: Home Anticipated Discharge Location: Home  Barriers to Discharge: Clinical improvement  Dispo: Anticipated discharge pending clinical improvement.   Harlow Ohms, DO PGY-2 IMTS 08/Jan/2022

## 2020-04-10 NOTE — Progress Notes (Signed)
Pt very anxious and demanding to go back on bipap at this time. Pt had HHN and was then placed back on bipap at previous settings.

## 2020-04-10 NOTE — Progress Notes (Signed)
Patient trialed off BIPAP at this time. Patient stated she wanted to see how she would do off. RT will continue to monitor.

## 2020-04-10 NOTE — ED Notes (Signed)
Pt removed her own bipap, refusing to wear bipap until she gets something to drink. Pt aware unable to drink and then wear bipap. Pt still refusing to wear mask.

## 2020-04-10 NOTE — ED Triage Notes (Signed)
Pt arrives to ED BIB [facility] due Respiratory Distress. Per EMS pt was [facility] with O2 sat of 78% RA. Per EMS pt was placed on CPAP, bringing O2 sats to 93%. 5 of Albuterol and .5 of Atrovent administered by EMS. Hx of Afib (on Xarelto), CHF and COPD. EDP and RT at bedside and pt placed on Bi-PAP. Pt A/O x4  BP 230/160 HR 160

## 2020-04-10 NOTE — ED Notes (Signed)
Pt provided [place] sandwich & ice water

## 2020-04-10 NOTE — Progress Notes (Signed)
Pt taken off bipap at this time and placed on 5L [place]. Pt with significant upper airway wheeze and congested cough. RN aware

## 2020-04-11 LAB — CBC
HCT: 33.4 % — ABNORMAL LOW (ref 36.0–46.0)
Hemoglobin: 11.2 g/dL — ABNORMAL LOW (ref 12.0–15.0)
MCH: 30.2 pg (ref 26.0–34.0)
MCHC: 33.5 g/dL (ref 30.0–36.0)
MCV: 90 fL (ref 80.0–100.0)
Platelets: 351 10*3/uL (ref 150–400)
RBC: 3.71 MIL/uL — ABNORMAL LOW (ref 3.87–5.11)
RDW: 14.8 % (ref 11.5–15.5)
WBC: 9.3 10*3/uL (ref 4.0–10.5)
nRBC: 0 % (ref 0.0–0.2)

## 2020-04-11 LAB — BASIC METABOLIC PANEL
Anion gap: 10 (ref 5–15)
BUN: 17 mg/dL (ref 8–23)
CO2: 26 mmol/L (ref 22–32)
Calcium: 8.5 mg/dL — ABNORMAL LOW (ref 8.9–10.3)
Chloride: 106 mmol/L (ref 98–111)
Creatinine, Ser: 1.06 mg/dL — ABNORMAL HIGH (ref 0.44–1.00)
GFR, Estimated: 57 mL/min — ABNORMAL LOW (ref >60)
Glucose, Bld: 110 mg/dL — ABNORMAL HIGH (ref 70–99)
Potassium: 3.3 mmol/L — ABNORMAL LOW (ref 3.5–5.1)
Sodium: 142 mmol/L (ref 135–145)

## 2020-04-11 LAB — MAGNESIUM: Magnesium: 1.6 mg/dL — ABNORMAL LOW (ref 1.7–2.4)

## 2020-04-11 MED ORDER — METOPROLOL SUCCINATE ER 50 MG PO TB24
50.0000 mg | ORAL_TABLET | Freq: Every day | ORAL | Status: DC
  Administered 2020-04-11 – 2020-04-15 (×5): 50 mg via ORAL
  Filled 2020-04-11: qty 2
  Filled 2020-04-11 (×4): qty 1

## 2020-04-11 MED ORDER — IPRATROPIUM-ALBUTEROL 0.5-2.5 (3) MG/3ML IN SOLN
3.0000 mL | Freq: Three times a day (TID) | RESPIRATORY_TRACT | Status: DC
  Administered 2020-04-11 (×3): 3 mL via RESPIRATORY_TRACT
  Filled 2020-04-11 (×5): qty 3

## 2020-04-11 MED ORDER — POTASSIUM CHLORIDE CRYS ER 20 MEQ PO TBCR
20.0000 meq | EXTENDED_RELEASE_TABLET | Freq: Two times a day (BID) | ORAL | Status: DC
  Administered 2020-04-11 – 2020-04-13 (×5): 20 meq via ORAL
  Filled 2020-04-11 (×6): qty 1

## 2020-04-11 NOTE — ED Notes (Signed)
Report called to [facility] on 3E

## 2020-04-11 NOTE — Progress Notes (Signed)
Pt currently not in any distress BiPAP is not needed at this time. RT will continue to monitor.

## 2020-04-11 NOTE — Progress Notes (Signed)
Subjective:   Patient seen sitting up in bed on [place]. She states she is feeling better and her breathing has improved.  Denies chest pain, abdominal pain.  She states she needs help at home where she lives alone.  Has some family who are nearby.  She uses a wheelchair and power scooter to get. Some days she struggles to even bend down to put her socks on. She takes percocet chronically.  Has attempted to get home health services before, but states insurance cannot cover it. She does not have supplemental oxygen at home.  Objective:  Vital signs in last 24 hours: Vitals:   04/11/20 0300 04/11/20 0430 04/11/20 0531 04/11/20 0630  BP: (!) 108/93 123/64 119/74 102/68  Pulse: (!) 110 97 82 97  Resp: (!) 23 (!) 22 (!) 29 (!) 24  Temp:      TempSrc:      SpO2: 99% 97% 98% 96%    Physical Exam Constitutional: no acute distress Head: atraumatic ENT: external ears normal Eyes: EOMI Cardiovascular: A fib without RVR, normal heart sounds, lower extremities with lymphedema but no clear pitting edema Pulmonary: effort normal, on 12 L nasal cannula, diffuse expiratory wheezing bilaterally Abdominal: flat, nontender, no rebound tenderness, bowel sounds normal Skin: warm and dry Neurological: alert, no focal deficit Psychiatric: normal mood and affect  Assessment/Plan: Sharon Vasquez is a 70 y.o. female with hx of COPD< HFpEF, A fib, HTN, ILD, OSA, chronic pain presenting with shortness of breath, diffuse wheezing exam so attributed to COPD exacerbation. Initially on BiPAP now transitioned to HFNC and feeling better.  Principal Problem:   Acute exacerbation of chronic obstructive pulmonary disease (COPD) ([place]) Active Problems:   Acute diastolic (congestive) heart failure (HCC)   Atrial fibrillation with controlled ventricular response (HCC)   Chronic pain syndrome   OSA (obstructive sleep apnea)   Acute respiratory failure with hypoxia (HCC)   Hypertensive urgency  Acute hypoxic  respiratory failure 2/2 COPD exacerbation Respiratory Bronchiolitis Associated ILD On exam had diffuse wheezing and appears volume down, so suspect symptoms related to COPD rather than heart failure.  Reports compliance with all her home medications this morning, though she had reported not taking any previously.  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  Chest x-ray with worsening bilateral interstitial opacities.  ABG on 1/8 with pH 7.25, PCO2 54, PO2 60.  Now off BiPAP and saturating well on 12 L nasal cannula. -Continue DuoNebs TID -Continue azithromycin 250mg , day 2 -Continue prednisone 40mg  daily, day 2 -Continue nasal cannula -Consider high-resolution CT chest if not improving, given history of ILD  Hypertensive urgency - resolved Hypotension - resolved Essential HTN Initially started on nitroglycerin drip and diltiazem for hypertensive urgency with systolic pressure in 161W, but these then dropped to 60s.  Noted that her BP measurements are questionable due to morbid obesity and wrong cuff size.  Received 1 L normal saline bolus.  Blood pressure now improved and is in normal range. -Holding home benazepril for now -Restarting home Toprol at lower dose of 50 mg for A. fib as below  A. fib with RVR Presented in A. fib with RVR.  Started on diltiazem drip initially, which was stopped due to hypotension.  Held home metoprolol due to hypotension as above.  Over the past day, heart rate has slowly trended up and is now in 110s. -Start Toprol at 50 mg daily (home dose of 100 mg), may increase if needed -  Avoid amiodarone due to ILD -Continue home Xarelto -Maintain K above 4, mag above 2 -Klor-Con 40 mEq daily -Magnesium sulfate 2 g today  HFpEF BNP elevated to 672 on admission, but appears dry on exam.  Holding IV Lasix due to hypotension. -Monitor volume status -Holding home metoprolol and benazepril as above for hypotension  Diet:  Heart  healthy IVF:  none VTE:  xarelto Prior to Admission Living Arrangement:  Home. Will likely need home health services upon discharge Anticipated Discharge Location:  home Barriers to Discharge:  COPD exacerbation management Dispo: Anticipated discharge in approximately 2-4 day(s).   Andrew Au, MD 04/11/2020, 7:04 AM Pager: [phone] After 5pm on weekdays and 1pm on weekends: On Call pager [phone]

## 2020-04-12 ENCOUNTER — Ambulatory Visit: Payer: Medicare Other | Admitting: *Deleted

## 2020-04-12 DIAGNOSIS — I480 Paroxysmal atrial fibrillation: Secondary | ICD-10-CM

## 2020-04-12 DIAGNOSIS — J441 Chronic obstructive pulmonary disease with (acute) exacerbation: Secondary | ICD-10-CM | POA: Diagnosis not present

## 2020-04-12 DIAGNOSIS — I5032 Chronic diastolic (congestive) heart failure: Secondary | ICD-10-CM

## 2020-04-12 DIAGNOSIS — I4891 Unspecified atrial fibrillation: Secondary | ICD-10-CM | POA: Diagnosis not present

## 2020-04-12 DIAGNOSIS — M170 Bilateral primary osteoarthritis of knee: Secondary | ICD-10-CM

## 2020-04-12 DIAGNOSIS — J9601 Acute respiratory failure with hypoxia: Secondary | ICD-10-CM | POA: Diagnosis not present

## 2020-04-12 DIAGNOSIS — R32 Unspecified urinary incontinence: Secondary | ICD-10-CM

## 2020-04-12 DIAGNOSIS — G894 Chronic pain syndrome: Secondary | ICD-10-CM

## 2020-04-12 DIAGNOSIS — J449 Chronic obstructive pulmonary disease, unspecified: Secondary | ICD-10-CM

## 2020-04-12 DIAGNOSIS — I160 Hypertensive urgency: Secondary | ICD-10-CM | POA: Diagnosis not present

## 2020-04-12 DIAGNOSIS — I10 Essential (primary) hypertension: Secondary | ICD-10-CM

## 2020-04-12 LAB — BASIC METABOLIC PANEL
Anion gap: 8 (ref 5–15)
BUN: 15 mg/dL (ref 8–23)
CO2: 24 mmol/L (ref 22–32)
Calcium: 8.7 mg/dL — ABNORMAL LOW (ref 8.9–10.3)
Chloride: 107 mmol/L (ref 98–111)
Creatinine, Ser: 0.96 mg/dL (ref 0.44–1.00)
GFR, Estimated: >60 mL/min (ref >60)
Glucose, Bld: 133 mg/dL — ABNORMAL HIGH (ref 70–99)
Potassium: 4 mmol/L (ref 3.5–5.1)
Sodium: 139 mmol/L (ref 135–145)

## 2020-04-12 LAB — CBC
HCT: 32 % — ABNORMAL LOW (ref 36.0–46.0)
Hemoglobin: 10.3 g/dL — ABNORMAL LOW (ref 12.0–15.0)
MCH: 28.9 pg (ref 26.0–34.0)
MCHC: 32.2 g/dL (ref 30.0–36.0)
MCV: 89.6 fL (ref 80.0–100.0)
Platelets: 336 10*3/uL (ref 150–400)
RBC: 3.57 MIL/uL — ABNORMAL LOW (ref 3.87–5.11)
RDW: 14.4 % (ref 11.5–15.5)
WBC: 8.4 10*3/uL (ref 4.0–10.5)
nRBC: 0 % (ref 0.0–0.2)

## 2020-04-12 LAB — MAGNESIUM: Magnesium: 1.8 mg/dL (ref 1.7–2.4)

## 2020-04-12 MED ORDER — GUAIFENESIN 100 MG/5ML PO SOLN: 5.0000 mL | ORAL | Status: DC | PRN

## 2020-04-12 MED ORDER — MAGNESIUM SULFATE 2 GM/50ML IV SOLN
2.0000 g | Freq: Once | INTRAVENOUS | Status: AC
  Administered 2020-04-12: 2 g via INTRAVENOUS
  Filled 2020-04-12: qty 50

## 2020-04-12 MED ORDER — GUAIFENESIN 100 MG/5ML PO SOLN
10.0000 mL | Freq: Two times a day (BID) | ORAL | Status: DC
  Administered 2020-04-12 – 2020-04-15 (×8): 200 mg via ORAL
  Filled 2020-04-12: qty 5
  Filled 2020-04-12: qty 10
  Filled 2020-04-12 (×2): qty 5
  Filled 2020-04-12: qty 10
  Filled 2020-04-12 (×2): qty 5
  Filled 2020-04-12: qty 25
  Filled 2020-04-12 (×2): qty 10

## 2020-04-12 MED ORDER — TORSEMIDE 20 MG PO TABS
20.0000 mg | ORAL_TABLET | Freq: Every day | ORAL | Status: DC
  Administered 2020-04-12 – 2020-04-14 (×3): 20 mg via ORAL
  Filled 2020-04-12 (×3): qty 1

## 2020-04-12 MED ORDER — OXYCODONE-ACETAMINOPHEN 5-325 MG PO TABS
1.0000 | ORAL_TABLET | ORAL | Status: DC | PRN
  Administered 2020-04-12 – 2020-04-15 (×9): 1 via ORAL
  Filled 2020-04-12 (×9): qty 1

## 2020-04-12 MED ORDER — IPRATROPIUM-ALBUTEROL 0.5-2.5 (3) MG/3ML IN SOLN
3.0000 mL | RESPIRATORY_TRACT | Status: DC
  Administered 2020-04-12 – 2020-04-13 (×6): 3 mL via RESPIRATORY_TRACT
  Filled 2020-04-12 (×6): qty 3

## 2020-04-12 NOTE — Evaluation (Signed)
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  She has dysphagia secondary to esophageal stenosis status post dilation with GI 1 month ago.  She has been off all of her medications for unclear reasons. Pt found to have Afib, RVR and hypertensive.  Clinical Impression  PTA, pt lives alone with occasional assist from her neighbors and is modI with stand pivot transfer to wheelchair. Pt presents with generalized weakness, decreased ROM, balance deficits, and obesity. Requiring min guard assist for stand pivot transfers. Recommending SNF to address deficits and maximize functional mobility.     Follow Up Recommendations SNF    Equipment Recommendations  None recommended by PT    Recommendations for Other Services       Precautions / Restrictions Precautions Precautions: Fall;Other (comment) Precaution Comments: watch O2 Restrictions Weight Bearing Restrictions: No      Mobility  Bed Mobility Overal bed mobility: Needs Assistance Bed Mobility: Supine to Sit;Sit to Supine     Supine to sit: Min guard Sit to supine: Min guard   General bed mobility comments: Min guard for safety, increased time, effort    Transfers Overall transfer level: Needs assistance Equipment used: Rolling walker (2 wheeled) Transfers: Sit to/from [facility] Sit to Stand: Min guard;From elevated surface Stand pivot transfers: Min guard       General transfer comment: Min guard to rise from elevated bed height and pivot onto [facility]  Ambulation/Gait                Stairs            Wheelchair Mobility    Modified Rankin (Stroke Patients Only)       Balance Overall balance assessment: Needs  assistance Sitting-balance support: No upper extremity supported;Feet supported Sitting balance-Leahy Scale: Good     Standing balance support: Single extremity supported Standing balance-Leahy Scale: Poor Standing balance comment: requiring single UE for balance for pericare at EOB stnading with BLEs supported behind pt by bed                             Pertinent Vitals/Pain Pain Assessment: Faces Faces Pain Scale: Hurts little more Pain Location: bilateral knees (chronic) Pain Descriptors / Indicators: Aching;Constant Pain Intervention(s): Monitored during session    Home Living Family/patient expects to be discharged to:: Skilled nursing facility Living Arrangements: Alone Available Help at Discharge: Friend(s);Available PRN/intermittently Type of Home: House Home Access: Ramped entrance       Home Equipment: Wheelchair - manual;Wheelchair - power;Bedside commode      Prior Function Level of Independence: Needs assistance   Gait / Transfers Assistance Needed: Mobility with electric w/c and manual w/c; transferring only; legs too heavy to mobilize  ADL's / Homemaking Assistance Needed: Pt requiring assist x1 mos for "deep clean" in shower        Hand Dominance   Dominant Hand: Right    Extremity/Trunk Assessment   Upper Extremity Assessment Upper Extremity Assessment: Generalized weakness    Lower Extremity Assessment Lower Extremity Assessment: Generalized weakness RLE Deficits / Details: + edema LLE Deficits / Details: + edema    Cervical / Trunk Assessment Cervical / Trunk Assessment: Other exceptions Cervical / Trunk Exceptions:  large body habitus  Communication   Communication: No difficulties  Cognition Arousal/Alertness: Awake/alert Behavior During Therapy: WFL for tasks assessed/performed Overall Cognitive Status: Within Functional Limits for tasks assessed                                        General Comments       Exercises General Exercises - Lower Extremity Ankle Circles/Pumps: Both;10 reps;Supine Heel Slides: Both;5 reps;Supine   Assessment/Plan    PT Assessment Patient needs continued PT services  PT Problem List Decreased strength;Decreased range of motion;Decreased activity tolerance;Decreased balance;Decreased mobility;Obesity;Pain       PT Treatment Interventions DME instruction;Gait training;Functional mobility training;Therapeutic activities;Therapeutic exercise;Balance training;Patient/family education    PT Goals (Current goals can be found in the Care Plan section)  Acute Rehab PT Goals Patient Stated Goal: to get OOB safely to bathroom PT Goal Formulation: With patient Time For Goal Achievement: 04/26/20 Potential to Achieve Goals: Good    Frequency Min 2X/week   Barriers to discharge        Co-evaluation               AM-PAC PT "6 Clicks" Mobility  Outcome Measure Help needed turning from your back to your side while in a flat bed without using bedrails?: None Help needed moving from lying on your back to sitting on the side of a flat bed without using bedrails?: A Little Help needed moving to and from a bed to a chair (including a wheelchair)?: A Little Help needed standing up from a chair using your arms (e.g., wheelchair or bedside chair)?: A Little Help needed to walk in hospital room?: A Lot Help needed climbing 3-5 steps with a railing? : Total 6 Click Score: 16    End of Session Equipment Utilized During Treatment: Oxygen Activity Tolerance: Patient tolerated treatment well Patient left: in bed;with call bell/phone within reach Nurse Communication: Mobility status PT Visit Diagnosis: Unsteadiness on feet (R26.81);Muscle weakness (generalized) (M62.81);Difficulty in walking, not elsewhere classified (R26.2);Pain Pain - Right/Left:  (both) Pain - part of body: Knee    Time: 0254-2706 PT Time Calculation (min) (ACUTE ONLY): 30 min   Charges:    PT Evaluation $PT Eval Moderate Complexity: 1 Mod PT Treatments $Therapeutic Activity: 8-22 mins        Wyona Almas, PT, DPT Acute Rehabilitation Services Pager [phone] Office [phone]   Deno Etienne 04/12/2020, 4:06 PM

## 2020-04-12 NOTE — Evaluation (Signed)
Occupational Therapy Evaluation Patient Details Name: Sharon Vasquez [name]: [id number] DOB: Aug 01, 1950 Today's Date: 04/12/2020    History of Present Illness Pt is 70 year old female with PMHx: COPD, HFpEF, history of DVT/PE on chronic Xarelto, and morbid obesity who presents to the ED with worsening shortness of breath.  She has dysphagia secondary to esophageal stenosis status post dilation with GI 1 month ago.  She has been off all of her medications for unclear reasons. Pt found to have Afib, RVR and hypertensive.   Clinical Impression   Pt PTA: pt living alone with occasional assist from neighbors. Pt currently,  Pt limited by decreased strength, decreased activity toleance, decreased ability to reach LB to care for self and increased pain in BLEs. Pt reports not being a candidate for knee sx due to lack of blood flow. Pt requiring increased assist for ADL and mobility needs. Pt requiring 5L O2 for >90% with exertion. Pt modA overall for ADL tasks and minguardA to modA +2 for mobility. Pt with varying ability for transfers and unsafe at this time due to weakness and increased edema in BLEs. Pt appears to be too wide for sarah stedy large. Pt would benefit from continued OT skilled services for ADL, mobility and safety in SNF setting. OT following acutely.    Follow Up Recommendations  SNF    Equipment Recommendations  3 in 1 bedside commode (wide bedside commode with drop arm)    Recommendations for Other Services       Precautions / Restrictions Precautions Precautions: Fall;Other (comment) Precaution Comments: watch O2 Restrictions Weight Bearing Restrictions: No      Mobility Bed Mobility Overal bed mobility: Needs Assistance             General bed mobility comments: Pt sitting at EOB pre and post session, but pt reporting "I needed help with my legs because I cannot lift them at all."    Transfers Overall transfer level: Needs assistance Equipment used:  None Transfers: Sit to/from Stand Sit to Stand: Min guard;From elevated surface         General transfer comment: Highly elevated bed for sit to stand; sliding feet forward, unable to take steps    Balance Overall balance assessment: Needs assistance Sitting-balance support: No upper extremity supported;Feet supported Sitting balance-Leahy Scale: Good     Standing balance support: Single extremity supported Standing balance-Leahy Scale: Poor Standing balance comment: requiring single UE for balance for pericare at EOB stnading with BLEs supported behind pt by bed                           ADL either performed or assessed with clinical judgement   ADL Overall ADL's : Needs assistance/impaired Eating/Feeding: Set up;Sitting   Grooming: Set up;Sitting Grooming Details (indicate cue type and reason): can stand for a few seconds, but no steps taken Upper Body Bathing: Set up;Sitting   Lower Body Bathing: Moderate assistance;Sitting/lateral leans;Sit to/from stand;Cueing for safety;Cueing for sequencing Lower Body Bathing Details (indicate cue type and reason): Pt can bend forward, butunable to reach her feet fully Upper Body Dressing : Set up;Sitting   Lower Body Dressing: Moderate assistance;Cueing for safety;Sit to/from stand;Sitting/lateral leans;+2 for physical assistance;+2 for safety/equipment Lower Body Dressing Details (indicate cue type and reason): Pt would require +2 for pull pants to waist for stability as pt requires BUEs with external support Toilet Transfer: Moderate assistance;+2 for physical assistance;+2 for safety/equipment;Stand-pivot;BSC Toilet Transfer Details (indicate cue type  and reason): +2 for stability; pt unable to pick BLEs up for task, only able to slightly slide; pt to used bedpan and pure wick with staff Toileting- Clothing Manipulation and Hygiene: Cueing for safety;Moderate assistance;Sitting/lateral lean;Sit to/from stand Toileting -  Clothing Manipulation Details (indicate cue type and reason): able to perform anterior pericare, but requiring assist for posterior pericare     Functional mobility during ADLs: Min guard;Cueing for safety;Moderate assistance (minguardA for sit to stand; modA for transfer) General ADL Comments: Pt limited by decreased strength, decreased activity toleance, decreased ability to reach LB to care for self and increased pain in BLEs. Pt reports not being a candidate for knee sx due to lack of blood flow. Pt requiring increased assist for ADL and mobility needs. Pt requiring 5L O2.     Vision Baseline Vision/History: No visual deficits Patient Visual Report: No change from baseline Vision Assessment?: No apparent visual deficits     Perception     Praxis      Pertinent Vitals/Pain Pain Assessment: No/denies pain     Hand Dominance Right   Extremity/Trunk Assessment Upper Extremity Assessment Upper Extremity Assessment: Generalized weakness   Lower Extremity Assessment Lower Extremity Assessment: Generalized weakness;RLE deficits/detail;LLE deficits/detail;Defer to PT evaluation RLE Deficits / Details: + edema LLE Deficits / Details: + edema   Cervical / Trunk Assessment Cervical / Trunk Assessment: Other exceptions Cervical / Trunk Exceptions: large body habitus   Communication Communication Communication: No difficulties   Cognition Arousal/Alertness: Awake/alert Behavior During Therapy: WFL for tasks assessed/performed Overall Cognitive Status: Within Functional Limits for tasks assessed                                     General Comments  5L O2 >90% with exertion    Exercises     Shoulder Instructions      Home Living Family/patient expects to be discharged to:: Skilled nursing facility Living Arrangements: Alone Available Help at Discharge: Friend(s);Available PRN/intermittently Type of Home: House Home Access: Ramped entrance            Bathroom Shower/Tub: Teacher, early years/pre: Standard     Home Equipment: Wheelchair - Education officer, community - power;Bedside commode          Prior Functioning/Environment Level of Independence: Needs assistance  Gait / Transfers Assistance Needed: Mobility with electric w/c and manual w/c; transferring only; legs too heavy to mobilize ADL's / Homemaking Assistance Needed: Pt requiring assist x1 mos for "deep clean" in shower            OT Problem List: Decreased strength;Decreased activity tolerance;Impaired balance (sitting and/or standing);Decreased knowledge of use of DME or AE;Increased edema;Decreased safety awareness;Cardiopulmonary status limiting activity      OT Treatment/Interventions: Self-care/ADL training;Therapeutic exercise;Energy conservation;Therapeutic activities;DME and/or AE instruction;Patient/family education;Balance training    OT Goals(Current goals can be found in the care plan section) Acute Rehab OT Goals Patient Stated Goal: to get OOB safely to bathroom OT Goal Formulation: With patient Time For Goal Achievement: 04/26/20 Potential to Achieve Goals: Good ADL Goals Pt Will Perform Lower Body Dressing: with min assist;sitting/lateral leans;sit to/from stand;with adaptive equipment Pt Will Transfer to Toilet: with min assist;squat pivot transfer;bedside commode Pt Will Perform Toileting - Clothing Manipulation and hygiene: with min assist;with adaptive equipment;sitting/lateral leans;sit to/from stand Pt/caregiver will Perform Home Exercise Program: Increased strength;Both right and left upper extremity;With Supervision Additional ADL Goal #1: Pt will  tolerate x10 mins of ADL tasks with 2 seated rest breaks in order to increase activity tolerance with O2 sats >90%  OT Frequency: Min 2X/week   Barriers to D/C: Decreased caregiver support  very little physical assist required       Co-evaluation              AM-PAC OT "6 Clicks"  Daily Activity     Outcome Measure Help from another person eating meals?: None Help from another person taking care of personal grooming?: A Little Help from another person toileting, which includes using toliet, bedpan, or urinal?: A Lot Help from another person bathing (including washing, rinsing, drying)?: A Lot Help from another person to put on and taking off regular upper body clothing?: A Little Help from another person to put on and taking off regular lower body clothing?: A Lot 6 Click Score: 16   End of Session Equipment Utilized During Treatment: Oxygen Nurse Communication: Mobility status  Activity Tolerance: Patient tolerated treatment well;Patient limited by pain Patient left: in bed;with call bell/phone within reach  OT Visit Diagnosis: Unsteadiness on feet (R26.81);Muscle weakness (generalized) (M62.81);Pain                Time: [id number] OT Time Calculation (min): 24 min Charges:  OT General Charges $OT Visit: 1 Visit OT Evaluation $OT Eval Moderate Complexity: 1 Mod OT Treatments $Self Care/Home Management : 8-22 mins  Jefferey Pica, OTR/L Acute Rehabilitation Services Pager: [phone] Office: [phone]   Yulitza Shorts C 04/12/2020, 12:04 PM

## 2020-04-12 NOTE — NC FL2 (Signed)
[place] LEVEL OF CARE SCREENING TOOL     IDENTIFICATION  Patient Name: Sharon Vasquez Birthdate: 05-01-1950 Sex: female Admission Date (Current Location): 04/10/2020  [facility] and [place] Number:  Herbalist and Address:  [facility]. [facility] [employer], [place] [address], [place], [place] [address]      Provider Number: [id number]  Attending Physician Name and Address:  No att. providers found  Relative Name and Phone Number:       Current Level of Care: Hospital Recommended Level of Care: [place] Prior Approval Number:    Date Approved/Denied:   PASRR Number: [id number]  Discharge Plan: SNF    Current Diagnoses: Patient Active Problem List   Diagnosis Date Noted  . Acute respiratory failure with hypoxia ([place]) 04/10/2020  . Hypertensive urgency 04/10/2020  . Swallowing difficulty 11/10/2019  . Chronic respiratory failure with hypoxia ([place]) 09/30/2019  . Mixed incontinence, urge and stress (female) (female) 08/21/2019  . Overgrown toenails 07/01/2019  . Preventative health care 06/04/2019  . GERD (gastroesophageal reflux disease) 01/20/2019  . Coronary artery calcification seen on CT scan 04/12/2018  . ILD (interstitial lung disease) ([place]) 02/13/2018  . Pain and numbness of right upper extremity 02/08/2018  . Long term current use of amiodarone 12/19/2017  . OSA (obstructive sleep apnea) 11/14/2017  . Chronic pain syndrome 07/27/2017  . Major depression, recurrent, chronic ([place]) 02/01/2017  . Osteoporosis 12/14/2016  . Drug induced constipation 08/21/2016  . Elevated blood sugar 07/19/2016  . Aortic atherosclerosis ([place]) 07/17/2016  . Chronic venous insufficiency 07/12/2016  . Chronic anticoagulation 07/12/2016  . Tobacco use 07/11/2016  . Acute exacerbation of chronic obstructive pulmonary disease (COPD) ([place])   . Acute diastolic (congestive) heart failure ([place])   . History of pulmonary embolism   . Atrial fibrillation  with controlled ventricular response ([place])   . Vocal cord polyp 12/18/2015  . Laryngopharyngeal reflux 12/18/2015  . Morbid obesity ([place])   . Peripheral vascular disease ([place])   . Benign essential HTN   . Primary osteoarthritis of both knees 09/16/2014    Orientation RESPIRATION BLADDER Height & Weight     Self,Time,Situation,Place  O2 (see d/c summary for oxygen requirements) Incontinent Weight: (!) 171.5 kg Height:     BEHAVIORAL SYMPTOMS/MOOD NEUROLOGICAL BOWEL NUTRITION STATUS      Continent Diet (heart healthy with thin liquids)  AMBULATORY STATUS COMMUNICATION OF NEEDS Skin   Extensive Assist Verbally Normal                       Personal Care Assistance Level of Assistance  Bathing,Feeding,Dressing Bathing Assistance: Limited assistance Feeding assistance: Independent Dressing Assistance: Maximum assistance     Functional Limitations Info  Sight,Hearing,Speech Sight Info: Impaired Hearing Info: Adequate Speech Info: Adequate    SPECIAL CARE FACTORS FREQUENCY  PT (By licensed PT),OT (By licensed OT)     PT Frequency: 5x/wk OT Frequency: 5x/wk            Contractures Contractures Info: Not present    Additional Factors Info  Code Status,Allergies,Psychotropic Code Status Info: Full Allergies Info: NKA Psychotropic Info: Neurontin 400 mg BID         Current Medications (04/12/2020):  This is the current hospital active medication list Current Facility-Administered Medications  Medication Dose Route Frequency Provider Last Rate Last Admin  . acetaminophen (TYLENOL) tablet 650 mg  650 mg Oral Q6H PRN Jose Persia, MD      . aspirin EC  tablet 81 mg  81 mg Oral Daily Jean Rosenthal, MD   81 mg at 04/12/20 0844  . atorvastatin (LIPITOR) tablet 40 mg  40 mg Oral Daily Agyei, Obed K, MD   40 mg at 04/12/20 0845  . azithromycin (ZITHROMAX) tablet 250 mg  250 mg Oral Daily Jean Rosenthal, MD   250 mg at 04/12/20 0845  . gabapentin (NEURONTIN) capsule 400  mg  400 mg Oral BID Jean Rosenthal, MD   400 mg at 04/12/20 0846  . guaiFENesin (ROBITUSSIN) 100 MG/5ML solution 200 mg  10 mL Oral BID Andrew Au, MD      . HYDROmorphone (DILAUDID) injection 0.5 mg  0.5 mg Intravenous Q8H Rehman, Areeg N, DO   0.5 mg at 04/10/20 2151  . ipratropium-albuterol (DUONEB) 0.5-2.5 (3) MG/3ML nebulizer solution 3 mL  3 mL Nebulization Q4H while awake Andrew Au, MD      . levalbuterol Penne Lash) nebulizer solution 0.63 mg  0.63 mg Nebulization Q6H PRN Mesner, Corene Cornea, MD   0.63 mg at 04/12/20 0906  . metoprolol succinate (TOPROL-XL) 24 hr tablet 50 mg  50 mg Oral Daily Andrew Au, MD   50 mg at 04/12/20 0845  . oxyCODONE-acetaminophen (PERCOCET/ROXICET) 5-325 MG per tablet 1 tablet  1 tablet Oral Daily PRN Agyei, Obed K, MD      . pantoprazole (PROTONIX) EC tablet 40 mg  40 mg Oral Daily Agyei, Obed K, MD   40 mg at 04/12/20 0844  . polyethylene glycol (MIRALAX / GLYCOLAX) packet 17 g  17 g Oral Daily PRN Agyei, Obed K, MD      . potassium chloride SA (KLOR-CON) CR tablet 20 mEq  20 mEq Oral BID Andrew Au, MD   20 mEq at 04/12/20 0845  . predniSONE (DELTASONE) tablet 40 mg  40 mg Oral Q breakfast Agyei, Obed K, MD   40 mg at 04/12/20 0845  . rivaroxaban (XARELTO) tablet 20 mg  20 mg Oral Daily Jean Rosenthal, MD   20 mg at 04/12/20 0846  . torsemide (DEMADEX) tablet 20 mg  20 mg Oral Daily Andrew Au, MD   20 mg at 04/12/20 0845     Discharge Medications: Please see discharge summary for a list of discharge medications.  Relevant Imaging Results:  Relevant Lab Results:   Additional Information SS#: [id number]  Pollie Friar, RN

## 2020-04-12 NOTE — Chronic Care Management (AMB) (Signed)
   04/12/2020  Lavera D Carcione [birth year] [id number]  Returned call to patient after she left a voice message for this CCM RN on 04/10/20 (Saturday) and today at 8:02 am stating she is in the hospital because she called EMS when she was having difficulty breathing and felt her heart racing.  Successful outreach to patient via mobile number;  she says she is feeling better and will go to skilled rehabilitation when she is stable for discharge from the hospital.  Requested patient notify this CCM RN when she has returned home. Patient voices understanding and compliance.  Kelli Churn RN, CCM, [facility] RN Care Manager [phone]

## 2020-04-12 NOTE — Progress Notes (Signed)
Subjective:   Patient reports she can tell the oxygen is being weaned. She feels short of breath. She states when she feels this way at home she uses her CPAP. She has a pain management appointment tomorrow. She reestablished care because she wasn't happy with her last doctor. She is worried about missing her appt tomorrow. Discussed her oxygen requirements are due to her COPD exacerbation. She is not sure if she wants to go to SNF vs home. She struggles with her ADLs. She is not sure if she can regain physical strength at her age.   Objective:  Vital signs in last 24 hours: Vitals:   04/11/20 2143 04/12/20 0036 04/12/20 0500 04/12/20 0502  BP: 104/66 104/70  101/62  Pulse: 80 80  79  Resp: 20 20  20   Temp: 98.9 F (37.2 C) 98.7 F (37.1 C)  98.7 F (37.1 C)  TempSrc: Oral Oral  Oral  SpO2: 98% 99%  100%  Weight:   (!) 171.5 kg     Physical Exam Constitutional: no acute distress Head: atraumatic ENT: external ears normal Eyes: EOMI Cardiovascular: A fib without RVR, normal heart sounds, lower extremities with lymphedema but no clear pitting edema Pulmonary: effort normal, on 12 L nasal cannula, diffuse inspiratory and expiratory wheezing bilaterally Abdominal: flat, nontender, no rebound tenderness, bowel sounds normal Skin: warm and dry Neurological: alert, no focal deficit Psychiatric: normal mood and affect  Assessment/Plan: Sharon Vasquez is a 71 y.o. female with hx of COPD< HFpEF, A fib, HTN, ILD, OSA, chronic pain presenting with shortness of breath, diffuse wheezing exam so attributed to COPD exacerbation. Initially on BiPAP now transitioned to HFNC, O2 requirement is decreasing.  Principal Problem:   Acute exacerbation of chronic obstructive pulmonary disease (COPD) ([place]) Active Problems:   Acute diastolic (congestive) heart failure (HCC)   Atrial fibrillation with controlled ventricular response (HCC)   Chronic pain syndrome   OSA (obstructive sleep apnea)    Acute respiratory failure with hypoxia (HCC)   Hypertensive urgency  Acute hypoxic respiratory failure 2/2 COPD exacerbation Respiratory Bronchiolitis Associated ILD On presentation had diffuse wheezing and appears volume down, so suspect symptoms related to COPD rather than heart failure.  Reports compliance with all her home medications this morning, though she had reported noncompliance previously.  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  Chest x-ray with worsening bilateral interstitial opacities.  ABG on 1/8 with pH 7.25, PCO2 54, PO2 60.  Now off BiPAP and saturating well on 6 L nasal cannula. Continues to have wheezing bilaterally but improving. -increase duonebs to q4hr while awake -Continue azithromycin 250mg , day 3 -Continue prednisone 40mg  daily, day 3 -Continue O2 by nasal canula, wean as tolerated -Consider high-resolution CT chest if not improving, given history of ILD  A. fib with RVR Presented in A. fib with RVR.  Started on diltiazem drip initially, which was stopped due to hypotension.  Restarted Toprol at 50mg  (home dose is 100mg ) with now good control of HR  -Continue Toprol at 50 mg daily (home dose of 100 mg), may increase if needed -Avoid amiodarone due to ILD -Continue home Xarelto -Maintain K above 4, mag above 2 -Klor-Con 40 mEq daily -Magnesium sulfate 2 g today  Essential HTN Initially started on nitroglycerin drip and diltiazem for hypertensive urgency with systolic pressure in 034V, but these then dropped to 60s.  Noted that her BP measurements are questionable due to morbid obesity and wrong cuff  size.  Received 1 L normal saline bolus.  Blood pressure now improved and is in normal range. -Holding home benazepril for now -Toprol at lowered dose of 50 mg for A. fib as above  HFpEF BNP elevated to 672 on admission, but appears dry on exam.   -Monitor volume status -restart torsemide 20mg  (home dose 40mg ) -Home  metoprolol and benazepril as above  Diet:  Heart healthy IVF:  none VTE:  xarelto Prior to Admission Living Arrangement:  Home. Will likely need home health services upon discharge Anticipated Discharge Location:  home Barriers to Discharge:  COPD exacerbation management Dispo: Anticipated discharge in approximately 2-4 day(s).   Andrew Au, MD 04/12/2020, 6:15 AM Pager: [phone] After 5pm on weekdays and 1pm on weekends: On Call pager [phone]

## 2020-04-12 NOTE — TOC Initial Note (Signed)
Transition of Care [facility]) - Initial/Assessment Note    Patient Details  Name: Sharon Vasquez [name]: [id number] Date of Birth: 09-13-1950  [facility]) CM/SW Contact:    Pollie Friar, RN Phone Number: 04/12/2020, 12:29 PM  Clinical Narrative:                 Recommendations are for SNF rehab. CM met with the patient and she is in agreement. She asked to be faxed out in the [facility] area. She states her daughter works at [employer] so that would be her #1 choice. CM will update [place].  TOC following.  Expected Discharge Plan: Skilled Nursing Facility Barriers to Discharge: Continued Medical Work up   Patient Goals and CMS Choice   CMS Medicare.gov Compare Post Acute Care list provided to:: Patient Choice offered to / list presented to : Patient  Expected Discharge Plan and Services Expected Discharge Plan: [place] In-house Referral: Clinical Social Work Discharge Planning Services: CM Consult Post Acute Care Choice: [place] Living arrangements for the past 2 months: Apartment                                      Prior Living Arrangements/Services Living arrangements for the past 2 months: Apartment   Patient language and need for interpreter reviewed:: Yes Do you feel safe going back to the place where you live?: Yes      Need for Family Participation in Patient Care: Yes (Comment) Care giver support system in place?: No (comment) Current home services: DME (wheelchair/ purwick) Criminal Activity/Legal Involvement Pertinent to Current Situation/Hospitalization: No - Comment as needed  Activities of Daily Living      Permission Sought/Granted                  Emotional Assessment Appearance:: Appears stated age Attitude/Demeanor/Rapport: Engaged Affect (typically observed): Accepting Orientation: : Oriented to Self,Oriented to Situation,Oriented to Place,Oriented to  Time   Psych Involvement:  No (comment)  Admission diagnosis:  Acute pulmonary edema (HCC) [J81.0] Atrial fibrillation with controlled ventricular response (HCC) [I48.91] Acute respiratory failure with hypoxia ([place]) [J96.01] Patient Active Problem List   Diagnosis Date Noted  . Acute respiratory failure with hypoxia ([place]) 04/10/2020  . Hypertensive urgency 04/10/2020  . Swallowing difficulty 11/10/2019  . Chronic respiratory failure with hypoxia ([place]) 09/30/2019  . Mixed incontinence, urge and stress (female) (female) 08/21/2019  . Overgrown toenails 07/01/2019  . Preventative health care 06/04/2019  . GERD (gastroesophageal reflux disease) 01/20/2019  . Coronary artery calcification seen on CT scan 04/12/2018  . ILD (interstitial lung disease) ([place]) 02/13/2018  . Pain and numbness of right upper extremity 02/08/2018  . Long term current use of amiodarone 12/19/2017  . OSA (obstructive sleep apnea) 11/14/2017  . Chronic pain syndrome 07/27/2017  . Major depression, recurrent, chronic ([place]) 02/01/2017  . Osteoporosis 12/14/2016  . Drug induced constipation 08/21/2016  . Elevated blood sugar 07/19/2016  . Aortic atherosclerosis ([place]) 07/17/2016  . Chronic venous insufficiency 07/12/2016  . Chronic anticoagulation 07/12/2016  . Tobacco use 07/11/2016  . Acute exacerbation of chronic obstructive pulmonary disease (COPD) ([place])   . Acute diastolic (congestive) heart failure ([place])   . History of pulmonary embolism   . Atrial fibrillation with controlled ventricular response ([place])   . Vocal cord polyp 12/18/2015  . Laryngopharyngeal reflux 12/18/2015  . Morbid obesity ([place])   .  Peripheral vascular disease ([place])   . Benign essential HTN   . Primary osteoarthritis of both knees 09/16/2014   PCP:  Andrew Au, MD Pharmacy:   [facility] [phone], [place] [address] [place] [address] Phone: [phone] Fax:  [phone]  [place], [address], [address] [place], [place] [address] Phone: [phone] Fax: [phone]     Social Determinants of Health (SDOH) Interventions    Readmission Risk Interventions No flowsheet data found.

## 2020-04-13 ENCOUNTER — Telehealth: Payer: Medicare Other

## 2020-04-13 DIAGNOSIS — J441 Chronic obstructive pulmonary disease with (acute) exacerbation: Secondary | ICD-10-CM | POA: Diagnosis not present

## 2020-04-13 LAB — BASIC METABOLIC PANEL
Anion gap: 8 (ref 5–15)
BUN: 18 mg/dL (ref 8–23)
CO2: 26 mmol/L (ref 22–32)
Calcium: 9 mg/dL (ref 8.9–10.3)
Chloride: 105 mmol/L (ref 98–111)
Creatinine, Ser: 1.02 mg/dL — ABNORMAL HIGH (ref 0.44–1.00)
GFR, Estimated: 59 mL/min — ABNORMAL LOW (ref >60)
Glucose, Bld: 132 mg/dL — ABNORMAL HIGH (ref 70–99)
Potassium: 3.7 mmol/L (ref 3.5–5.1)
Sodium: 139 mmol/L (ref 135–145)

## 2020-04-13 LAB — CBC
HCT: 31.7 % — ABNORMAL LOW (ref 36.0–46.0)
Hemoglobin: 10.9 g/dL — ABNORMAL LOW (ref 12.0–15.0)
MCH: 30.2 pg (ref 26.0–34.0)
MCHC: 34.4 g/dL (ref 30.0–36.0)
MCV: 87.8 fL (ref 80.0–100.0)
Platelets: 354 10*3/uL (ref 150–400)
RBC: 3.61 MIL/uL — ABNORMAL LOW (ref 3.87–5.11)
RDW: 14.1 % (ref 11.5–15.5)
WBC: 8.4 10*3/uL (ref 4.0–10.5)
nRBC: 0 % (ref 0.0–0.2)

## 2020-04-13 LAB — MAGNESIUM: Magnesium: 1.8 mg/dL (ref 1.7–2.4)

## 2020-04-13 MED ORDER — MAGNESIUM SULFATE 2 GM/50ML IV SOLN
2.0000 g | Freq: Once | INTRAVENOUS | Status: AC
  Administered 2020-04-13: 2 g via INTRAVENOUS
  Filled 2020-04-13: qty 50

## 2020-04-13 NOTE — Progress Notes (Signed)
Patient places self on CPAP.  Patient instructed to call RT if assistance is needed.  Patient showed understanding.

## 2020-04-13 NOTE — Progress Notes (Signed)
Subjective:   This morning, patient reports that she continues to have shortness of breath and wheezing. She reports ongoing chronic pain in her bilateral lower extremities that is worse at night. She reports that she did "alright" when working with physical and occupational therapy yesterday. She would like to discharge to [facility] if possible as her daughter works at this location.  Objective:  Vital signs in last 24 hours: Vitals:   04/12/20 1947 04/12/20 2121 04/12/20 2356 04/13/20 0452  BP:   138/70   Pulse:  75 86   Resp:  20 18   Temp:   98.6 F (37 C)   TempSrc:   Oral   SpO2: 98% 97% 97%   Weight:    (!) 171.2 kg    Physical Exam Constitutional: no acute distress Head: atraumatic ENT: external ears normal Eyes: EOMI Cardiovascular: A fib without RVR, normal heart sounds, lower extremities with lymphedema but no clear pitting edema Pulmonary: effort normal, on 5 L nasal cannula,expiratory wheezing bilaterally which is improved from yesterday Abdominal: flat, nontender, no rebound tenderness, bowel sounds normal Skin: warm and dry Neurological: alert, no focal deficit Psychiatric: normal mood and affect  Assessment/Plan: Sharon Vasquez is a 70 y.o. female with hx of COPD< HFpEF, A fib, HTN, ILD, OSA, chronic pain presenting with shortness of breath, diffuse wheezing exam so attributed to COPD exacerbation. Initially on BiPAP now transitioned to HFNC, O2 requirement is decreasing.  Principal Problem:   Acute exacerbation of chronic obstructive pulmonary disease (COPD) ([place]) Active Problems:   Acute diastolic (congestive) heart failure (HCC)   Atrial fibrillation with controlled ventricular response (HCC)   Chronic pain syndrome   OSA (obstructive sleep apnea)   Acute respiratory failure with hypoxia (HCC)   Hypertensive urgency  Acute hypoxic respiratory failure 2/2 COPD exacerbation Respiratory Bronchiolitis Associated ILD On presentation had diffuse  wheezing and appears volume down, so suspect symptoms related to COPD rather than heart failure.  Reports compliance with all her home medications this morning, though she had reported noncompliance previously.  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  Chest x-ray with worsening bilateral interstitial opacities.  ABG on 1/8 with pH 7.25, PCO2 54, PO2 60.  Now off BiPAP and saturating well on nasal cannula. Continues to have wheezing bilaterally but improving. -continue duonebs q4hr while awake -Continue azithromycin 250mg , day 4 -Continue prednisone 40mg  daily, day 4 -Continue O2 by nasal canula, wean as tolerated -Consider high-resolution CT chest in outpatient setting to evaluate for progression of ILD  A. fib with RVR Presented in A. fib with RVR.  Started on diltiazem drip initially, which was stopped due to hypotension.  Restarted Toprol at 50mg  (home dose is 100mg ) with now good control of HR  -Continue Toprol at 50 mg daily (home dose of 100 mg), may increase if needed -Avoid amiodarone due to ILD -Continue home Xarelto -Maintain K above 4, mag above 2 -Klor-Con 40 mEq daily -Magnesium sulfate 2 g today  Essential HTN Initially started on nitroglycerin drip and diltiazem for hypertensive urgency with systolic pressure in 086V, but these then dropped to 60s.  Noted that her BP measurements are questionable due to morbid obesity and wrong cuff size.  Received 1 L normal saline bolus.  Blood pressure now improved and is in normal range. -Holding home benazepril for now -Toprol at lowered dose of 50 mg for A. fib as above  HFpEF BNP  elevated to 672 on admission, but appears dry on exam.   -Monitor volume status -continue home torsemide 20mg  daily -Home metoprolol and benazepril as above  Hypomagnesemia Hypokalemia Mg 1.8 and K 3.7 -2g mangesium today -home klor-con 55mEq daily  Chronic pain  syndrome Osteoarthritis of bilateral knees Currently on home medications. She is concerned that she missed her appointment at her new pain clinic due to this hospitalization. -continue home Percocet 5-325 q4hr prn -can bridge pain medications upon discharge until her next pain clinic appointment  Diet:  Heart healthy IVF:  none VTE:  xarelto Prior to Admission Living Arrangement:  Home. Will likely need home health services upon discharge Anticipated Discharge Location:  home Barriers to Discharge:  COPD exacerbation management Dispo: Anticipated discharge in approximately 2-4 day(s).   Andrew Au, MD 04/13/2020, 6:14 AM Pager: [phone] After 5pm on weekdays and 1pm on weekends: On Call pager [phone]

## 2020-04-13 NOTE — TOC Progression Note (Signed)
Transition of Care [facility]) - Progression Note    Patient Details  Name: Sharon Vasquez [name]: [id number] Date of Birth: Feb 21, 1951  Transition of Care [facility]) CM/SW Contact  Pollie Friar, RN Phone Number: 04/13/2020, 3:16 PM  Clinical Narrative:    CM provided the patient her offers for SNF rehab. She asked to see if [facility]ly, [facility] could offer a bed. CM inquired with all the above facilities and they are not able to offer a bed. She does have an offer from [facility]. CM updated the patient and she has accepted the offer. CM informed [facility]. Per MD pt is not quite ready for d/c (couple of days).  TOC following.   Expected Discharge Plan: [place] Barriers to Discharge: Continued Medical Work up  Expected Discharge Plan and Services Expected Discharge Plan: [place] In-house Referral: Clinical Social Work Discharge Planning Services: CM Consult Post Acute Care Choice: [place] Living arrangements for the past 2 months: Apartment                                       Social Determinants of Health (SDOH) Interventions    Readmission Risk Interventions No flowsheet data found.

## 2020-04-14 DIAGNOSIS — J441 Chronic obstructive pulmonary disease with (acute) exacerbation: Secondary | ICD-10-CM | POA: Diagnosis not present

## 2020-04-14 LAB — CBC
HCT: 34.3 % — ABNORMAL LOW (ref 36.0–46.0)
Hemoglobin: 11.5 g/dL — ABNORMAL LOW (ref 12.0–15.0)
MCH: 29.6 pg (ref 26.0–34.0)
MCHC: 33.5 g/dL (ref 30.0–36.0)
MCV: 88.4 fL (ref 80.0–100.0)
Platelets: 408 10*3/uL — ABNORMAL HIGH (ref 150–400)
RBC: 3.88 MIL/uL (ref 3.87–5.11)
RDW: 14.2 % (ref 11.5–15.5)
WBC: 8 10*3/uL (ref 4.0–10.5)
nRBC: 0 % (ref 0.0–0.2)

## 2020-04-14 LAB — BASIC METABOLIC PANEL
Anion gap: 11 (ref 5–15)
BUN: 22 mg/dL (ref 8–23)
CO2: 27 mmol/L (ref 22–32)
Calcium: 8.7 mg/dL — ABNORMAL LOW (ref 8.9–10.3)
Chloride: 103 mmol/L (ref 98–111)
Creatinine, Ser: 1.09 mg/dL — ABNORMAL HIGH (ref 0.44–1.00)
GFR, Estimated: 55 mL/min — ABNORMAL LOW (ref >60)
Glucose, Bld: 178 mg/dL — ABNORMAL HIGH (ref 70–99)
Potassium: 3.5 mmol/L (ref 3.5–5.1)
Sodium: 141 mmol/L (ref 135–145)

## 2020-04-14 LAB — SARS CORONAVIRUS 2 (TAT 6-24 HRS): SARS Coronavirus 2: NEGATIVE

## 2020-04-14 LAB — MAGNESIUM: Magnesium: 1.8 mg/dL (ref 1.7–2.4)

## 2020-04-14 MED ORDER — POTASSIUM CHLORIDE CRYS ER 20 MEQ PO TBCR
40.0000 meq | EXTENDED_RELEASE_TABLET | Freq: Every day | ORAL | Status: DC
  Administered 2020-04-14: 40 meq via ORAL
  Filled 2020-04-14: qty 2

## 2020-04-14 MED ORDER — PREDNISONE 5 MG PO TABS: 5.0000 mg | ORAL_TABLET | Freq: Every day | ORAL | Status: DC

## 2020-04-14 MED ORDER — IPRATROPIUM-ALBUTEROL 0.5-2.5 (3) MG/3ML IN SOLN
3.0000 mL | Freq: Three times a day (TID) | RESPIRATORY_TRACT | Status: DC
  Administered 2020-04-14 – 2020-04-15 (×4): 3 mL via RESPIRATORY_TRACT
  Filled 2020-04-14 (×4): qty 3

## 2020-04-14 MED ORDER — PREDNISONE 20 MG PO TABS
30.0000 mg | ORAL_TABLET | Freq: Every day | ORAL | Status: DC
  Administered 2020-04-15: 30 mg via ORAL
  Filled 2020-04-14: qty 1

## 2020-04-14 MED ORDER — PREDNISONE 10 MG PO TABS: 10.0000 mg | ORAL_TABLET | Freq: Every day | ORAL | Status: DC

## 2020-04-14 MED ORDER — PREDNISONE 20 MG PO TABS: 20.0000 mg | ORAL_TABLET | Freq: Every day | ORAL | Status: DC

## 2020-04-14 MED ORDER — POTASSIUM CHLORIDE 20 MEQ PO PACK
40.0000 meq | PACK | Freq: Once | ORAL | Status: DC
  Filled 2020-04-14: qty 2

## 2020-04-14 MED ORDER — POTASSIUM CHLORIDE CRYS ER 20 MEQ PO TBCR
20.0000 meq | EXTENDED_RELEASE_TABLET | Freq: Two times a day (BID) | ORAL | Status: DC
  Administered 2020-04-15: 20 meq via ORAL
  Filled 2020-04-14: qty 1

## 2020-04-14 NOTE — Progress Notes (Signed)
Subjective:   This morning, patient reports that she is breathing well and denies shortness of breath. She does endorse occassional wheezing. She would be comfortable with using oxygen at home if needed.  Objective:  Vital signs in last 24 hours: Vitals:   04/13/20 1510 04/13/20 1919 04/13/20 2014 04/14/20 0357  BP:   (!) 156/94 109/77  Pulse:   90 79  Resp:   16 16  Temp:   98.7 F (37.1 C) 98.9 F (37.2 C)  TempSrc:   Oral Oral  SpO2: 95% 95% 98%   Weight:        Physical Exam Constitutional: no acute distress Head: atraumatic ENT: external ears normal Eyes: EOMI  Cardiovascular: A fib without RVR, normal heart sounds, lower extremities with lymphedema but no clear pitting edema Pulmonary: effort normal, on 4 L nasal cannula, mild expiratory wheezing bilaterally which is improved from yesterday Abdominal: flat, nontender, no rebound tenderness, bowel sounds normal Skin: warm and dry Neurological: alert, no focal deficit Psychiatric: normal mood and affect  Assessment/Plan: Sharon Vasquez is a 70 y.o. female with hx of COPD< HFpEF, A fib, HTN, ILD, OSA, chronic pain presenting with shortness of breath, diffuse wheezing exam so attributed to COPD exacerbation. Initially on BiPAP now transitioned to HFNC, O2 requirement is decreasing.  Principal Problem:   Acute exacerbation of chronic obstructive pulmonary disease (COPD) ([place]) Active Problems:   Acute diastolic (congestive) heart failure (HCC)   Atrial fibrillation with controlled ventricular response (HCC)   Chronic pain syndrome   OSA (obstructive sleep apnea)   Acute respiratory failure with hypoxia (HCC)   Hypertensive urgency  Acute hypoxic respiratory failure 2/2 COPD exacerbation Respiratory Bronchiolitis Associated ILD On presentation had diffuse wheezing and appears volume down, so suspect symptoms related to COPD rather than heart failure.  Reports compliance with all her home medications this morning,  though she had reported noncompliance previously.  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  Chest x-ray with worsening bilateral interstitial opacities.  ABG on 1/8 with pH 7.25, PCO2 54, PO2 60.  Now off BiPAP and saturating well on nasal cannula. Continues to have wheezing bilaterally but improving. -continue duonebs q4hr while awake -Continue azithromycin 250mg , day 5 -Continue prednisone 40mg  daily, day 5, taper starting tomorrow -Continue O2 by nasal canula, wean as tolerated -Consider high-resolution CT chest in outpatient setting to evaluate for progression of ILD  A. fib with RVR Hypokalemia, hypomagnesemia Presented in A. fib with RVR.  Started on diltiazem drip initially, which was stopped due to hypotension.  Restarted Toprol at 50mg  (home dose is 100mg ) with now good control of HR  -Continue Toprol at 50 mg daily (home dose of 100 mg), may increase if needed -Avoid amiodarone due to ILD -Continue home Xarelto -Maintain K above 4, mag above 2 -Klor-Con 79mEq BID, extra 15mEq dose today  Essential HTN Initially started on nitroglycerin drip and diltiazem for hypertensive urgency with systolic pressure in 161W, but these then dropped to 60s. Blood pressure now improved and is somewhat labile but in acceptable range. -Holding home benazepril for now -Toprol at lowered dose of 50 mg for A. fib as above  HFpEF BNP elevated to 672 on admission, but appears dry on exam.   -Monitor volume status -continue home torsemide 20mg  daily -Home metoprolol and benazepril as above  Chronic pain syndrome Osteoarthritis of bilateral knees Currently on home medications. She is concerned that she  missed her appointment at her new pain clinic due to this hospitalization. -continue home Percocet 5-325 q4hr prn -can bridge pain medications upon discharge until her next pain clinic appointment  Diet:  Heart healthy IVF:   none VTE:  xarelto Prior to Admission Living Arrangement:  Home. Will likely need home health services upon discharge Anticipated Discharge Location:  home Barriers to Discharge:  COPD exacerbation management Dispo: Anticipated discharge in approximately 1-3 day(s).   Andrew Au, MD 04/14/2020, 6:34 AM Pager: [phone] After 5pm on weekdays and 1pm on weekends: On Call pager [phone]

## 2020-04-14 NOTE — TOC Progression Note (Signed)
Transition of Care [facility]) - Progression Note    Patient Details  Name: Sharon Vasquez [name]: [id number] Date of Birth: [birth year]  Transition [facility] Care [facility]) CM/SW Contact  Pollie Friar, RN Phone Number: 04/14/2020, 2:28 PM  Clinical Narrative:    CM received info from MD that pt is ready for SNF rehab. CM updated [facility] and they have retracted their bed offer.  CM does have insurance approval for SNF rehab.  Covid test ordered.  CM has sent out request for the other pending facilities to please review the referral and update CM. TOC following.   Expected Discharge Plan: [place] Barriers to Discharge: Continued Medical Work up  Expected Discharge Plan and Services Expected Discharge Plan: [place] In-house Referral: Clinical Social Work Discharge Planning Services: CM Consult Post Acute Care Choice: [place] Living arrangements for the past 2 months: Apartment                                       Social Determinants of Health (SDOH) Interventions    Readmission Risk Interventions No flowsheet data found.

## 2020-04-14 NOTE — Progress Notes (Signed)
SATURATION QUALIFICATIONS: (This note is used to comply with regulatory documentation for home oxygen)  Patient Saturations on Room Air at Rest = 92%  Patient Saturations on Room Air while Ambulating = 88%  Patient Saturations on 2 Liters of oxygen while Ambulating = 96%  Please briefly explain why patient needs home oxygen: To maintain oxygen saturations > 90% when ambulating.  Wyona Almas, PT, DPT Acute Rehabilitation Services Pager [phone] Office [phone]

## 2020-04-14 NOTE — Progress Notes (Signed)
Patient refused bed alarm at this time. Risk vs. Benefit discussed with patient but patient adamant that she does not want to use the bed alarm despite being a high fall risk.

## 2020-04-14 NOTE — Progress Notes (Signed)
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  She has dysphagia secondary to esophageal stenosis status post dilation with GI 1 month ago.  She has been off all of her medications for unclear reasons. Pt found to have Afib, RVR and hypertensive.    PT Comments    Pt progressing towards her physical therapy goals. Ambulating 10 feet with a walker at a min guard assist level. SpO2 88-92% on RA, HR peak 140 bpm with increased time static standing for peri care. Pt continues with generalized weakness, balance deficits, and decreased cardiopulmonary endurance. Continue to recommend SNF for ongoing Physical Therapy.      Follow Up Recommendations  SNF     Equipment Recommendations  None recommended by PT    Recommendations for Other Services       Precautions / Restrictions Precautions Precautions: Fall;Other (comment) Precaution Comments: watch O2 Restrictions Weight Bearing Restrictions: No    Mobility  Bed Mobility Overal bed mobility: Needs Assistance Bed Mobility: Supine to Sit     Supine to sit: Supervision     General bed mobility comments: Increased time/effort  Transfers Overall transfer level: Needs assistance Equipment used: Rolling walker (2 wheeled) Transfers: Sit to/from Stand Sit to Stand: Min guard;From elevated surface Stand pivot transfers: Min guard       General transfer comment: Min guard to rise from elevated surface, use of momentum  Ambulation/Gait Ambulation/Gait assistance: Min guard Gait Distance (Feet): 10 Feet Assistive device: Rolling walker (2 wheeled) Gait Pattern/deviations: Wide base of support;Decreased stride length;Trunk flexed Gait velocity: decreased   General  Gait Details: Min guard for safety, slow and effortful pace, increased trunk flexion   Stairs             Wheelchair Mobility    Modified Rankin (Stroke Patients Only)       Balance Overall balance assessment: Needs assistance Sitting-balance support: No upper extremity supported;Feet supported Sitting balance-Leahy Scale: Good     Standing balance support: Bilateral upper extremity supported Standing balance-Leahy Scale: Poor                              Cognition Arousal/Alertness: Awake/alert Behavior During Therapy: WFL for tasks assessed/performed Overall Cognitive Status: Within Functional Limits for tasks assessed                                        Exercises      General Comments        Pertinent Vitals/Pain Pain Assessment: Faces Faces Pain Scale: Hurts little more Pain Location: bilateral knees (chronic) Pain Descriptors / Indicators: Aching;Constant Pain Intervention(s): Monitored during session    Home Living                      Prior Function            PT Goals (current goals can now be found in the care plan section) Acute Rehab PT Goals Patient Stated Goal: to get OOB safely to bathroom PT Goal Formulation: With patient Time For Goal Achievement: 04/26/20 Potential to Achieve Goals: Good Progress towards  PT goals: Progressing toward goals    Frequency    Min 2X/week      PT Plan Current plan remains appropriate    Co-evaluation              AM-PAC PT "6 Clicks" Mobility   Outcome Measure  Help needed turning from your back to your side while in a flat bed without using bedrails?: None Help needed moving from lying on your back to sitting on the side of a flat bed without using bedrails?: None Help needed moving to and from a bed to a chair (including a wheelchair)?: A Little Help needed standing up from a chair using your arms (e.g., wheelchair or bedside chair)?: A  Little Help needed to walk in hospital room?: A Little Help needed climbing 3-5 steps with a railing? : Total 6 Click Score: 18    End of Session Equipment Utilized During Treatment: Oxygen Activity Tolerance: Patient tolerated treatment well Patient left: with call bell/phone within reach;in chair Nurse Communication: Mobility status PT Visit Diagnosis: Unsteadiness on feet (R26.81);Muscle weakness (generalized) (M62.81);Difficulty in walking, not elsewhere classified (R26.2);Pain Pain - Right/Left:  (both) Pain - part of body: Knee     Time: 1440-1505 PT Time Calculation (min) (ACUTE ONLY): 25 min  Charges:  $Therapeutic Activity: 23-37 mins                     Wyona Almas, PT, DPT Acute Rehabilitation Services Pager [phone] Office [phone]    Deno Etienne 04/14/2020, 4:48 PM

## 2020-04-14 NOTE — Progress Notes (Signed)
Pt's breathing improved. Currently on 2L after PT walked with her in the room. Voiding plenty, vitals stable.

## 2020-04-14 NOTE — Progress Notes (Signed)
Patient places self on CPAP.  Patient instructed to call RT if assistance is needed.  Patient showed understanding. 

## 2020-04-15 DIAGNOSIS — J449 Chronic obstructive pulmonary disease, unspecified: Secondary | ICD-10-CM | POA: Diagnosis not present

## 2020-04-15 DIAGNOSIS — G894 Chronic pain syndrome: Secondary | ICD-10-CM | POA: Diagnosis not present

## 2020-04-15 DIAGNOSIS — I10 Essential (primary) hypertension: Secondary | ICD-10-CM | POA: Diagnosis not present

## 2020-04-15 DIAGNOSIS — I517 Cardiomegaly: Secondary | ICD-10-CM | POA: Diagnosis not present

## 2020-04-15 DIAGNOSIS — M6281 Muscle weakness (generalized): Secondary | ICD-10-CM | POA: Diagnosis not present

## 2020-04-15 DIAGNOSIS — R918 Other nonspecific abnormal finding of lung field: Secondary | ICD-10-CM | POA: Diagnosis not present

## 2020-04-15 DIAGNOSIS — E785 Hyperlipidemia, unspecified: Secondary | ICD-10-CM | POA: Diagnosis not present

## 2020-04-15 DIAGNOSIS — I482 Chronic atrial fibrillation, unspecified: Secondary | ICD-10-CM | POA: Diagnosis not present

## 2020-04-15 DIAGNOSIS — Z743 Need for continuous supervision: Secondary | ICD-10-CM | POA: Diagnosis not present

## 2020-04-15 DIAGNOSIS — G4733 Obstructive sleep apnea (adult) (pediatric): Secondary | ICD-10-CM | POA: Diagnosis not present

## 2020-04-15 DIAGNOSIS — J441 Chronic obstructive pulmonary disease with (acute) exacerbation: Secondary | ICD-10-CM | POA: Diagnosis not present

## 2020-04-15 DIAGNOSIS — I4891 Unspecified atrial fibrillation: Secondary | ICD-10-CM | POA: Diagnosis not present

## 2020-04-15 DIAGNOSIS — M2550 Pain in unspecified joint: Secondary | ICD-10-CM | POA: Diagnosis not present

## 2020-04-15 DIAGNOSIS — J8417 Interstitial lung disease with progressive fibrotic phenotype in diseases classified elsewhere: Secondary | ICD-10-CM | POA: Diagnosis not present

## 2020-04-15 DIAGNOSIS — R609 Edema, unspecified: Secondary | ICD-10-CM | POA: Diagnosis not present

## 2020-04-15 DIAGNOSIS — K219 Gastro-esophageal reflux disease without esophagitis: Secondary | ICD-10-CM | POA: Diagnosis not present

## 2020-04-15 DIAGNOSIS — R6889 Other general symptoms and signs: Secondary | ICD-10-CM | POA: Diagnosis not present

## 2020-04-15 DIAGNOSIS — Z7401 Bed confinement status: Secondary | ICD-10-CM | POA: Diagnosis not present

## 2020-04-15 DIAGNOSIS — I509 Heart failure, unspecified: Secondary | ICD-10-CM | POA: Diagnosis not present

## 2020-04-15 DIAGNOSIS — R2681 Unsteadiness on feet: Secondary | ICD-10-CM | POA: Diagnosis not present

## 2020-04-15 DIAGNOSIS — J9610 Chronic respiratory failure, unspecified whether with hypoxia or hypercapnia: Secondary | ICD-10-CM | POA: Diagnosis not present

## 2020-04-15 LAB — CBC
HCT: 32.9 % — ABNORMAL LOW (ref 36.0–46.0)
Hemoglobin: 11.3 g/dL — ABNORMAL LOW (ref 12.0–15.0)
MCH: 30.2 pg (ref 26.0–34.0)
MCHC: 34.3 g/dL (ref 30.0–36.0)
MCV: 88 fL (ref 80.0–100.0)
Platelets: 405 10*3/uL — ABNORMAL HIGH (ref 150–400)
RBC: 3.74 MIL/uL — ABNORMAL LOW (ref 3.87–5.11)
RDW: 13.9 % (ref 11.5–15.5)
WBC: 9.2 10*3/uL (ref 4.0–10.5)
nRBC: 0 % (ref 0.0–0.2)

## 2020-04-15 LAB — BASIC METABOLIC PANEL
Anion gap: 10 (ref 5–15)
BUN: 21 mg/dL (ref 8–23)
CO2: 28 mmol/L (ref 22–32)
Calcium: 8.7 mg/dL — ABNORMAL LOW (ref 8.9–10.3)
Chloride: 104 mmol/L (ref 98–111)
Creatinine, Ser: 0.99 mg/dL (ref 0.44–1.00)
GFR, Estimated: >60 mL/min (ref >60)
Glucose, Bld: 138 mg/dL — ABNORMAL HIGH (ref 70–99)
Potassium: 4 mmol/L (ref 3.5–5.1)
Sodium: 142 mmol/L (ref 135–145)

## 2020-04-15 LAB — MAGNESIUM: Magnesium: 1.8 mg/dL (ref 1.7–2.4)

## 2020-04-15 MED ORDER — TORSEMIDE 20 MG PO TABS
10.0000 mg | ORAL_TABLET | Freq: Two times a day (BID) | ORAL | Status: DC
  Administered 2020-04-15 (×2): 10 mg via ORAL
  Filled 2020-04-15 (×3): qty 1

## 2020-04-15 MED ORDER — POTASSIUM CHLORIDE CRYS ER 20 MEQ PO TBCR
20.0000 meq | EXTENDED_RELEASE_TABLET | Freq: Two times a day (BID) | ORAL | Status: DC
  Administered 2020-04-15: 20 meq via ORAL
  Filled 2020-04-15 (×2): qty 1

## 2020-04-15 MED ORDER — OXYCODONE-ACETAMINOPHEN 5-325 MG PO TABS: 1.0000 | ORAL_TABLET | ORAL | 0 refills | Status: DC | PRN

## 2020-04-15 MED ORDER — PREDNISONE 5 MG PO TABS: 5.0000 mg | ORAL_TABLET | Freq: Every day | ORAL | Status: DC

## 2020-04-15 MED ORDER — PREDNISONE 10 MG PO TABS: 10.0000 mg | ORAL_TABLET | Freq: Every day | ORAL | Status: DC

## 2020-04-15 MED ORDER — MOMETASONE FURO-FORMOTEROL FUM 200-5 MCG/ACT IN AERO
2.0000 | INHALATION_SPRAY | Freq: Two times a day (BID) | RESPIRATORY_TRACT | Status: DC
  Administered 2020-04-15 (×2): 2 via RESPIRATORY_TRACT
  Filled 2020-04-15: qty 8.8

## 2020-04-15 MED ORDER — BENAZEPRIL HCL 20 MG PO TABS
20.0000 mg | ORAL_TABLET | Freq: Every day | ORAL | Status: DC
Start: 2020-04-15 — End: 2020-04-16
  Administered 2020-04-15: 20 mg via ORAL
  Filled 2020-04-15: qty 1

## 2020-04-15 MED ORDER — PREDNISONE 10 MG PO TABS: 30.0000 mg | ORAL_TABLET | Freq: Every day | ORAL | 0 refills | Status: AC

## 2020-04-15 MED ORDER — UMECLIDINIUM BROMIDE 62.5 MCG/INH IN AEPB
1.0000 | INHALATION_SPRAY | Freq: Every day | RESPIRATORY_TRACT | Status: DC
  Administered 2020-04-15: 14:00:00 1 via RESPIRATORY_TRACT
  Filled 2020-04-15: qty 7

## 2020-04-15 MED ORDER — IPRATROPIUM-ALBUTEROL 0.5-2.5 (3) MG/3ML IN SOLN: 3.0000 mL | RESPIRATORY_TRACT | Status: DC | PRN

## 2020-04-15 MED ORDER — PREDNISONE 5 MG PO TABS: 5.0000 mg | ORAL_TABLET | Freq: Every day | ORAL | 0 refills | Status: AC

## 2020-04-15 MED ORDER — TIOTROPIUM BROMIDE MONOHYDRATE 18 MCG IN CAPS: 18.0000 ug | ORAL_CAPSULE | Freq: Every day | RESPIRATORY_TRACT | Status: DC

## 2020-04-15 MED ORDER — PREDNISONE 20 MG PO TABS: 30.0000 mg | ORAL_TABLET | Freq: Every day | ORAL | Status: DC

## 2020-04-15 MED ORDER — METOPROLOL SUCCINATE ER 50 MG PO TB24: 50.0000 mg | ORAL_TABLET | Freq: Every day | ORAL | 0 refills | Status: DC

## 2020-04-15 MED ORDER — BENAZEPRIL HCL 40 MG PO TABS: 20.0000 mg | ORAL_TABLET | Freq: Every day | ORAL | 0 refills | Status: DC

## 2020-04-15 MED ORDER — GUAIFENESIN 100 MG/5ML PO SOLN: 10.0000 mL | Freq: Two times a day (BID) | ORAL | 0 refills | Status: DC

## 2020-04-15 MED ORDER — PREDNISONE 10 MG PO TABS: 10.0000 mg | ORAL_TABLET | Freq: Every day | ORAL | 0 refills | Status: AC

## 2020-04-15 MED ORDER — POTASSIUM CHLORIDE CRYS ER 20 MEQ PO TBCR: 20.0000 meq | EXTENDED_RELEASE_TABLET | Freq: Two times a day (BID) | ORAL | Status: DC

## 2020-04-15 MED ORDER — PREDNISONE 20 MG PO TABS: 20.0000 mg | ORAL_TABLET | Freq: Every day | ORAL | Status: DC

## 2020-04-15 MED ORDER — PREDNISONE 10 MG PO TABS: 20.0000 mg | ORAL_TABLET | Freq: Every day | ORAL | 0 refills | Status: AC

## 2020-04-15 NOTE — Plan of Care (Signed)
  Problem: Education: Goal: Knowledge of General Education information will improve Description: Including pain rating scale, medication(s)/side effects and non-pharmacologic comfort measures Outcome: Adequate for Discharge   Problem: Health Behavior/Discharge Planning: Goal: Ability to manage health-related needs will improve Outcome: Adequate for Discharge   Problem: Clinical Measurements: Goal: Ability to maintain clinical measurements within normal limits will improve Outcome: Adequate for Discharge Goal: Will remain free from infection Outcome: Adequate for Discharge Goal: Diagnostic test results will improve Outcome: Adequate for Discharge Goal: Respiratory complications will improve Outcome: Adequate for Discharge Goal: Cardiovascular complication will be avoided Outcome: Adequate for Discharge   Problem: Activity: Goal: Risk for activity intolerance will decrease Outcome: Adequate for Discharge   Problem: Nutrition: Goal: Adequate nutrition will be maintained Outcome: Adequate for Discharge   Problem: Coping: Goal: Level of anxiety will decrease Outcome: Adequate for Discharge   Problem: Elimination: Goal: Will not experience complications related to bowel motility Outcome: Adequate for Discharge Goal: Will not experience complications related to urinary retention Outcome: Adequate for Discharge   Problem: Pain Managment: Goal: General experience of comfort will improve Outcome: Adequate for Discharge   Problem: Safety: Goal: Ability to remain free from injury will improve Outcome: Adequate for Discharge   Problem: Skin Integrity: Goal: Risk for impaired skin integrity will decrease Outcome: Adequate for Discharge   Problem: Education: Goal: Knowledge of disease or condition will improve Outcome: Adequate for Discharge Goal: Knowledge of the prescribed therapeutic regimen will improve Outcome: Adequate for Discharge   Problem: Activity: Goal: Ability  to tolerate increased activity will improve Outcome: Adequate for Discharge Goal: Will verbalize the importance of balancing activity with adequate rest periods Outcome: Adequate for Discharge   Problem: Respiratory: Goal: Ability to maintain a clear airway will improve Outcome: Adequate for Discharge Goal: Levels of oxygenation will improve Outcome: Adequate for Discharge Goal: Ability to maintain adequate ventilation will improve Outcome: Adequate for Discharge

## 2020-04-15 NOTE — TOC Progression Note (Signed)
Transition of Care [facility]) - Progression Note    Patient Details  Name: GENIA PERIN [name]: [id number] Date of Birth: [birth year]  [facility]) CM/SW Contact  Pollie Friar, RN Phone Number: 04/15/2020, 1:05 PM  Clinical Narrative:    [facility] a bed and pt has accepted. CM has called [employer] to have authorization changed.  Covid test negative. Pt is vaccinated. Family to bring CPAP and purewick from home to the SNF. Facility is checking to see if they will have a bed for her today.  TOC following.   Expected Discharge Plan: [place] Barriers to Discharge: Continued Medical Work up  Expected Discharge Plan and Services Expected Discharge Plan: [place] In-house Referral: Clinical Social Work Discharge Planning Services: CM Consult Post Acute Care Choice: [place] Living arrangements for the past 2 months: Apartment                                       Social Determinants of Health (SDOH) Interventions    Readmission Risk Interventions No flowsheet data found.

## 2020-04-15 NOTE — Progress Notes (Signed)
Occupational Therapy Treatment Patient Details Name: LENNYX VERDELL [name]: [id number] DOB: [birth year] Today's Date: 04/15/2020    History of present illness Pt is 70 year old female with PMHx: COPD, HFpEF, history of DVT/PE on chronic Xarelto, and morbid obesity who presents to the ED with worsening shortness of breath.  She has dysphagia secondary to esophageal stenosis status post dilation with GI 1 month ago.  She has been off all of her medications for unclear reasons. Pt found to have Afib, RVR and hypertensive.   OT comments  Pt making steady progress towards OT goals this session. Session focus on BADL reeducation and functional mobility. Pt continues to present with bilateral knee pain impacting pts functional mobility, pt reports at home she doesn't really get up much bc her knees give out, uses w/c for community mobility but demonstrate ability to ambualte ~ 5 ftx2 with seated rest break with RW and MINA. pt wears purewick all the timet at home. Pt currently requires MOD A for UB ADLs and MAX A for LB ADLs. Pt on 2L upon arrival, doffed O2 during session with drop to 86% with exertion however pt able to increase sats with seated rest break and PLB. Pt would continue to benefit from skilled occupational therapy while admitted and after d/c to address the below listed limitations in order to improve overall functional mobility and facilitate independence with BADL participation. DC plan remains appropriate, will follow acutely per POC.     Follow Up Recommendations  SNF    Equipment Recommendations  3 in 1 bedside commode;Other (comment) (wide bedside commode with drop arm)    Recommendations for Other Services      Precautions / Restrictions Precautions Precautions: Fall;Other (comment) Precaution Comments: watch O2 Restrictions Weight Bearing Restrictions: No       Mobility Bed Mobility               General bed mobility comments: pt recieved sitting EOB and returned to  EOB  Transfers Overall transfer level: Needs assistance Equipment used: Rolling walker (2 wheeled) Transfers: Sit to/from Stand Sit to Stand: Min guard;From elevated surface         General transfer comment: Min guard to rise from elevated surface, use of momentum    Balance Overall balance assessment: Needs assistance Sitting-balance support: No upper extremity supported;Feet supported Sitting balance-Leahy Scale: Good Sitting balance - Comments: sitting EOB with no UE suport or LOB noted   Standing balance support: Single extremity supported;During functional activity Standing balance-Leahy Scale: Poor Standing balance comment: at least one UE supported during self care tasks                           ADL either performed or assessed with clinical judgement   ADL Overall ADL's : Needs assistance/impaired         Upper Body Bathing: Moderate assistance;Sitting Upper Body Bathing Details (indicate cue type and reason): to wash pts back while sitting at sink, pt reports she does her baths at kitchen sink Lower Body Bathing: Maximal assistance;Sit to/from stand;Sitting/lateral leans Lower Body Bathing Details (indicate cue type and reason): pt able to lean forward to bet LB for bathing but requries assist to reah buttock in standing Upper Body Dressing : Minimal assistance;Sitting Upper Body Dressing Details (indicate cue type and reason): to don new gown Lower Body Dressing: Moderate assistance;Cueing for safety;Sit to/from stand;Sitting/lateral leans;+2 for physical assistance;+2 for safety/equipment;Set up Lower Body Dressing Details (indicate cue type  and reason): Pt would require +2 for pull pants to waist for stability as pt requires BUEs with external support, but able to don slide on shoes with set- up from sitting at sink Toilet Transfer: Minimal assistance;RW;Ambulation Toilet Transfer Details (indicate cue type and reason): pt completed simulated toilet  transfer by ambulating from EOB<>sink for bathing, MIN A for safety adn RW mgmt. pt reports using purewick at home and rarely walks into BR d/t incontinence Toileting- Clothing Manipulation and Hygiene: Cueing for safety;Moderate assistance;Sitting/lateral lean;Sit to/from stand Toileting - Clothing Manipulation Details (indicate cue type and reason): able to perform anterior pericare, but requiring assist for posterior pericare   Tub/Shower Transfer Details (indicate cue type and reason): pt reports she compeltes wash ups at kitchen sink Functional mobility during ADLs: Minimal assistance;Rolling walker General ADL Comments: pt continues to present with bilateral knees impacting pts functional mobility, pt reports at home she doesn't reallyget up much bc her knees give out, uses w/c for community mobility but can ambualte ~ 5 ftx2 with seated rest break with RW and MINA. pt wear purewick all the timet at home     Vision       Perception     Praxis      Cognition Arousal/Alertness: Awake/alert Behavior During Therapy: WFL for tasks assessed/performed Overall Cognitive Status: Within Functional Limits for tasks assessed                                          Exercises     Shoulder Instructions       General Comments pt on 3L upon arrival with O2 96% at rest, doffed O2 during session with drop to 86% with exertion however pt able to increase sats with seated rest break and PLB, pt reports pulse ox at home.pt c/o headache upon arrival and increased BP BP 155/106 ( 123) MD aware. left pt on RA with sats at 91%    Pertinent Vitals/ Pain       Pain Assessment: Faces Faces Pain Scale: Hurts a little bit Pain Location: headache Pain Descriptors / Indicators: Headache Pain Intervention(s): Monitored during session  Home Living                                          Prior Functioning/Environment              Frequency  Min 2X/week         Progress Toward Goals  OT Goals(current goals can now be found in the care plan section)  Progress towards OT goals: Progressing toward goals  Acute Rehab OT Goals Patient Stated Goal: to get OOB safely to bathroom OT Goal Formulation: With patient Time For Goal Achievement: 04/26/20 Potential to Achieve Goals: Good  Plan Discharge plan remains appropriate;Frequency remains appropriate    Co-evaluation                 AM-PAC OT "6 Clicks" Daily Activity     Outcome Measure   Help from another person eating meals?: None Help from another person taking care of personal grooming?: A Little Help from another person toileting, which includes using toliet, bedpan, or urinal?: A Lot Help from another person bathing (including washing, rinsing, drying)?: A Lot Help from another person to put on and  taking off regular upper body clothing?: A Little Help from another person to put on and taking off regular lower body clothing?: A Lot 6 Click Score: 16    End of Session Equipment Utilized During Treatment: Oxygen;Other (comment);Rolling walker (2L at start of session)  OT Visit Diagnosis: Unsteadiness on feet (R26.81);Muscle weakness (generalized) (M62.81);Pain Pain - part of body:  (headache)   Activity Tolerance Patient tolerated treatment well   Patient Left in bed;with call bell/phone within reach   Nurse Communication Mobility status;Other (comment) (left on RA sitting EOB)        Time: [id number] OT Time Calculation (min): 46 min  Charges: OT General Charges $OT Visit: 1 Visit OT Treatments $Self Care/Home Management : 38-52 mins Harley [place]., COTA/L Acute Rehabilitation Services [place]    Precious Haws 04/15/2020, 9:55 AM

## 2020-04-15 NOTE — Care Management Important Message (Signed)
Important Message  Patient Details  Name: CORRISSA MARTELLO [name]: [id number] Date of Birth: 08/13/1950   Medicare Important Message Given:  Yes     Shelda Altes 04/15/2020, 10:33 AM

## 2020-04-15 NOTE — TOC Transition Note (Signed)
Transition of Care [facility]) - CM/SW Discharge Note   Patient Details  Name: DENIECE RANKIN [name]: [id number] Date of Birth: [birth year]  [facility] of [facility]) CM/SW Contact:  Pollie Friar, RN Phone Number: 04/15/2020, 2:52 PM   Clinical Narrative:    Pt discharging to [facility]. CM updated patient who called her family. They are to bring her purwick and CPAP to the facility. Pt transporting via [facility].   Room:  Number for report: [phone]  Final next level of care: Skilled Nursing Facility Barriers to Discharge: No Barriers Identified   Patient Goals and CMS Choice   CMS Medicare.gov Compare Post Acute Care list provided to:: Patient Choice offered to / list presented to : Patient  Discharge Placement              Patient chooses bed at:  [facility]) Patient to be transferred to facility by: [place] Name of family member notified: Patient called her daughter Patient and family notified of of transfer: 04/15/20  Discharge Plan and Services In-house Referral: Clinical Social Work Discharge Planning Services: [employer] Consult Post Acute Care Choice: [place]                               Social Determinants of Health (SDOH) Interventions     Readmission Risk Interventions No flowsheet data found.

## 2020-04-15 NOTE — Discharge Summary (Addendum)
Name: Sharon Vasquez [name]: [id number] DOB: 09-30-1950 70 y.o. PCP: Andrew Au, MD  Date of Admission: 04/10/2020 12:55 AM Date of Discharge:  04/15/2020 Attending Physician: Aldine Contes  Discharge Diagnosis: 1. COPD exacerbation 2. Respiratory Bronchiolitis associated ILD 3. Chronic Pain Syndrome  Discharge Medications: Allergies as of 04/15/2020   No Known Allergies     Medication List    STOP taking these medications   hydrALAZINE 25 MG tablet Commonly known as: APRESOLINE   predniSONE 5 MG (48) Tbpk tablet Commonly known as: STERAPRED UNI-PAK 48 TAB Replaced by: predniSONE 10 MG tablet     TAKE these medications   albuterol (2.5 MG/3ML) 0.083% nebulizer solution Commonly known as: PROVENTIL TAKE 3 MLS(1 AMPULE) BY NEBULIZATION EVERY 4 HOURS AS NEEDED FOR [place] OR SHORTNESS OF BREATH What changed:   how much to take  how to take this  when to take this  reasons to take this  additional instructions   albuterol 108 (90 Base) MCG/ACT inhaler Commonly known as: VENTOLIN HFA USE 2 INHALATIONS BY MOUTH  EVERY 6 HOURS AS NEEDED FOR WHEEZING OR SHORTNESS OF  BREATH What changed: See the new instructions.   aspirin EC 81 MG tablet Take 81 mg by mouth daily.   atorvastatin 40 MG tablet Commonly known as: LIPITOR Take 1 tablet (40 mg total) by mouth daily.   benazepril 40 MG tablet Commonly known as: LOTENSIN Take 0.5 tablets (20 mg total) by mouth daily. What changed: how much to take   esomeprazole 40 MG capsule Commonly known as: NEXIUM Take 1 capsule (40 mg total) by mouth daily as needed (for heartburn or indigestion). What changed: when to take this   Fluticasone-Salmeterol 250-50 MCG/DOSE Aepb Commonly known as: Advair Diskus Inhale 1 puff into the lungs 2 (two) times daily. Rinse mouth after each use   gabapentin 400 MG capsule Commonly known as: NEURONTIN TAKE 1 CAPSULE BY MOUTH  TWICE DAILY   guaiFENesin 100 MG/5ML Soln Commonly  known as: ROBITUSSIN Take 10 mLs (200 mg total) by mouth 2 (two) times daily.   ibuprofen 200 MG tablet Commonly known as: ADVIL Take 800 mg by mouth every 6 (six) hours as needed (inflammation).   metoprolol succinate 50 MG 24 hr tablet Commonly known as: TOPROL-XL Take 1 tablet (50 mg total) by mouth daily. Take with or immediately following a meal. What changed:   medication strength  See the new instructions.   mirabegron ER 50 MG Tb24 tablet Commonly known as: MYRBETRIQ Take 50 mg by mouth daily.   multivitamin with minerals Tabs tablet Take 1 tablet by mouth daily.   oxyCODONE-acetaminophen 5-325 MG tablet Commonly known as: PERCOCET/ROXICET Take 1 tablet by mouth every 4 (four) hours as needed for up to 5 days. What changed:   when to take this  reasons to take this   potassium chloride 10 MEQ tablet Commonly known as: KLOR-CON TAKE 2 TABLETS(20 MEQ) BY MOUTH TWICE DAILY What changed: See the new instructions.   predniSONE 10 MG tablet Commonly known as: DELTASONE Take 3 tablets (30 mg total) by mouth daily with breakfast for 4 days. Start taking on: April 17, 2020 Replaces: predniSONE 5 MG (48) Tbpk tablet   predniSONE 10 MG tablet Commonly known as: DELTASONE Take 2 tablets (20 mg total) by mouth daily with breakfast for 4 days. Start taking on: April 22, 2020   predniSONE 10 MG tablet Commonly known as: DELTASONE Take 1 tablet (10 mg total) by mouth daily  with breakfast for 4 days. Start taking on: April 26, 2020   predniSONE 5 MG tablet Commonly known as: DELTASONE Take 1 tablet (5 mg total) by mouth daily with breakfast for 4 days. Start taking on: May 01, 2020   PRESCRIPTION MEDICATION Inhale into the lungs at bedtime. cpap   rivaroxaban 20 MG Tabs tablet Commonly known as: XARELTO Take 1 tablet (20 mg total) by mouth every morning.   Spiriva HandiHaler 18 MCG inhalation capsule Generic drug: tiotropium PLACE 1 CAPSULE INTO  INHALER AND INHALE DAILY What changed:   how much to take  how to take this  when to take this  additional instructions   torsemide 20 MG tablet Commonly known as: DEMADEX TAKE 2 TABLETS BY MOUTH DAILY What changed: how much to take            Durable Medical Equipment  (From admission, onward)         Start     Ordered   04/15/20 0625  For home use only DME oxygen  Once       Question Answer Comment  Length of Need Lifetime   Mode or (Route) Nasal cannula   Liters per Minute 2   Frequency Continuous (stationary and portable oxygen unit needed)   Oxygen conserving device Yes   Oxygen delivery system Gas      04/15/20 0625          Disposition and follow-up:   Sharon Vasquez was discharged from [facility] in Stable condition.  At the hospital follow up visit please address:  1.  Follow up:  Interstitial lung disease- consider repeat high-resolution CT to assess for progression  Hypertension- medications held for hypotension during this visit and restarted at lower dose, consider increasing benazepril back to 40 mg and Toprol back to 100 mg if hypertensive  Chronic pain syndrome, osteoarthritis- patient had to reschedule pain clinic appointment with his hospitalization, or clinic can bridge her until the next appointment  2.  Labs / imaging needed at time of follow-up: Consider high-resolution CT  3.  Pending labs/ test needing follow-up: None  Follow-up Appointments:  Follow-up Information    Andrew Au, MD.   Specialty: Internal Medicine Contact information: [place] [address] [phone]               Hospital Course by problem list:  Acute hypoxic respiratory failure due to COPD exacerbation Respiratory bronchiolitis associated ILD O2 saturation of 78% upon EMS arrival.  Started on BiPAP at the ED. ABG on 1/8 with pH 7.25, PCO2 54, PO2 60.  She had severe diffuse wheezing and appears volume down,  so was treated for COPD exacerbation with 5 days of azithromycin and steroids followed by prednisone taper.  On next day was transitioned to high flow nasal cannula 12 L which was gradually weaned down.  She ultimately was discharged with home oxygen.  She continued to have significant inspiratory and expiratory wheezing for several days so continue to receive scheduled duo nebs.  Eventually O2 requirement decreased and wheezing has resolved.  She endorses feeling short of breath at home at times but does not have home O2, will use the CPAP machine when she feels short of breath.  Discharging with home O2.  Restarted on home COPD medications.  A. fib with RVR Presented in A. fib with RVR.  Started on diltiazem drip initially, which was stopped due to hypotension as below.  She was  restarted on Toprol at 50 mg (home dose is 100 mg) with good control of heart rate.  Also continued home Xarelto.  Hypertensive crisis Hypotension Presented with systolic blood pressure initially 230s and started on nitroglycerin drip as well as diltiazem drip comorbid A. fib with RVR.  Some of her blood pressure measurements are questionable due to obesity and ill fitting cuff.  She required 1 L bolus and has stopped, blood pressure then improved to normal range.  Later in the hospitalization, blood pressure crept up into the [id number] systolic the patient is complaining of headache and asking for home medication, so benazepril was started at 20 mg (Home dose 40 mg).  Chronic pain syndrome Transition back to her home regimen and pain has been well controlled here.  She unfortunately missed her appointment at her new pain clinic due to this hospitalization.  This helps her with mobility around the house.  Our clinic can bridge her until she can make her next appointment.  Discharge Vitals:   BP 127/85 (BP Location: Left Arm)   Pulse 93   Temp 98 F (36.7 C) (Oral)   Resp 20   Wt (!) 167.1 kg   SpO2 94%   BMI 47.30 kg/m    Pertinent Labs, Studies, and Procedures:  DG Chest Portable 1 View  Result Date: 04/10/2020 CLINICAL DATA:  Respiratory distress. EXAM: PORTABLE CHEST 1 VIEW COMPARISON:  Radiograph 09/30/2019.  Chest CT 10/10/2019 FINDINGS: Chronic cardiomegaly with probable progression. Diffuse bilateral interstitial opacities suspicious for pulmonary edema. Right costophrenic angle not included in the field of view, limited assessment for effusion. No pneumothorax. Soft tissue attenuation from habitus limits detailed assessment. IMPRESSION: Chronic cardiomegaly with probable progression. Diffuse bilateral interstitial opacities suspicious for pulmonary edema. Electronically Signed   By: Keith Rake M.D.   On: 04/10/2020 01:35    BNP: 672  ABG    Component Value Date/Time   PHART 7.255 (L) 04/10/2020 0500   PCO2ART 54.4 (H) 04/10/2020 0500   PO2ART 60 (L) 04/10/2020 0500   HCO3 24.2 04/10/2020 0500   TCO2 26 04/10/2020 0500   ACIDBASEDEF 4.0 (H) 04/10/2020 0500   O2SAT 87.0 04/10/2020 0500     Discharge Instructions: Discharge Instructions    Call MD for:  difficulty breathing, headache or visual disturbances   Complete by: As directed    Call MD for:  extreme fatigue   Complete by: As directed    Diet - low sodium heart healthy   Complete by: As directed    Discharge instructions   Complete by: As directed    Sharon Vasquez, it has been a pleasure taking care of you.  We treated you for a COPD flare.  We are discharging you to skilled nursing facility for some rehab before you go home, and they can help you set up some home health afterwards.  Here your discharge instructions.  1.  Continue working on your functional status at rehab 2.  I have prescribed you a steroid taper over the next 16 days.  Follow the instructions to gradually decrease her daily dose over time. 3.  Your blood pressure has been on the low side here, so I decreased the dose of some of your blood pressure medications.  I  have half the dose of your Toprol and your benazepril.  We may increase these again in clinic if your blood pressure goes back up again. 4.  I am sorry you missed your pain clinic appointment.  Please reschedule soon as  you can.  I am discharging you with some medications, and call our clinic if you run out before then.   Increase activity slowly   Complete by: As directed       Signed: Andrew Au, MD 04/15/2020, 2:46 PM   Pager: [phone]

## 2020-04-15 NOTE — Progress Notes (Signed)
D/C instructions printed and placed in packet ar nurse's station. Primary RN to call report.

## 2020-04-15 NOTE — Progress Notes (Addendum)
Subjective:   This morning, patient reports that she feels well but she is worried about her elevated blood pressure. Additionally, she endorses a headache. She states that she is breathing well today.  Objective:  Vital signs in last 24 hours: Vitals:   04/15/20 0000 04/15/20 0054 04/15/20 0149 04/15/20 0618  BP: (!) 158/95 121/80  (!) 154/108  Pulse: 87 88  95  Resp: 20   18  Temp: 98 F (36.7 C) 97.6 F (36.4 C)  97.6 F (36.4 C)  TempSrc: Oral Oral  Oral  SpO2: 98% 96%  96%  Weight:   (!) 167.1 kg     Physical Exam Constitutional: no acute distress Head: atraumatic ENT: external ears normal Eyes: EOMI  Cardiovascular: A fib without RVR, normal heart sounds, lower extremities with lymphedema but no clear pitting edema Pulmonary: effort normal, on 4 L nasal cannula, lungs clear to ascultation bilaterally  Abdominal: obese Skin: warm and dry Neurological: alert, no focal deficit Psychiatric: normal mood and affect  Assessment/Plan: Sharon Vasquez is a 70 y.o. female with hx of COPD< HFpEF, A fib, HTN, ILD, OSA, chronic pain presenting with shortness of breath, diffuse wheezing exam so attributed to COPD exacerbation. Initially on BiPAP now transitioned to HFNC, O2 requirement is decreasing.  Principal Problem:   Acute exacerbation of chronic obstructive pulmonary disease (COPD) ([place]) Active Problems:   Acute diastolic (congestive) heart failure (HCC)   Atrial fibrillation with controlled ventricular response (HCC)   Chronic pain syndrome   OSA (obstructive sleep apnea)   Acute respiratory failure with hypoxia (HCC)   Hypertensive urgency  Acute hypoxic respiratory failure 2/2 COPD exacerbation Respiratory Bronchiolitis Associated ILD On presentation had diffuse wheezing and appears volume down, so suspect symptoms related to COPD rather than heart failure.  Reports compliance with all her home medications this morning, though she had reported noncompliance  previously.  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  Chest x-ray with worsening bilateral interstitial opacities.  ABG on 1/8 with pH 7.25, PCO2 54, PO2 60.  Now off BiPAP and saturating well on nasal cannula. Continues to have wheezing bilaterally but improving. Completed 5 days of azithromycin and steroids, now on prednisone taper -switch duonebs to prn -start prednisone taper day 1: 30mg  x4d > 20mg  x4d > 10mg  x4d > 5mg  -start substitutes for some Advair and Spiriva -Continue O2 by nasal canula, wean as tolerated -Consider high-resolution CT chest in outpatient setting to evaluate for progression of ILD  A. fib with RVR Hypokalemia, hypomagnesemia Presented in A. fib with RVR.  Started on diltiazem drip initially, which was stopped due to hypotension.  Restarted Toprol at 50mg  (home dose is 100mg ) with now good control of HR  -Continue Toprol at 50 mg daily (home dose of 100 mg), may increase if needed -Avoid amiodarone due to ILD -Continue home Xarelto -Maintain K above 4, mag above 2 -Klor-Con 63mEq BID  Essential HTN Initially started on nitroglycerin drip and diltiazem for hypertensive urgency with systolic pressure in 161W, but these then dropped to 60s. Blood pressure now improved and is somewhat labile but in acceptable range. -restart benazepril at 20mg  (home dose of 40mg ) -Toprol at lowered dose of 50 mg for A. fib as above  HFpEF BNP elevated to 672 on admission, but appears dry on exam.   -Monitor volume status -continue home torsemide 20mg  daily -Home metoprolol and benazepril as above  Chronic pain syndrome  Osteoarthritis of bilateral knees Currently on home medications. She is concerned that she missed her appointment at her new pain clinic due to this hospitalization. -continue home Percocet 5-325 q4hr prn -can bridge pain medications upon discharge until her next pain clinic  appointment  Diet:  Heart healthy IVF:  none VTE:  xarelto Prior to Admission Living Arrangement:  Home. Will likely need home health services upon discharge Anticipated Discharge Location:  SNF Barriers to Discharge:  SNF placement Dispo: Medically stable for discharge  Andrew Au, MD 04/15/2020, 6:25 AM Pager: [phone] After 5pm on weekdays and 1pm on weekends: On Call pager [phone]

## 2020-04-16 ENCOUNTER — Other Ambulatory Visit: Payer: Self-pay

## 2020-04-16 ENCOUNTER — Other Ambulatory Visit: Payer: Self-pay | Admitting: Internal Medicine

## 2020-04-16 DIAGNOSIS — I10 Essential (primary) hypertension: Secondary | ICD-10-CM

## 2020-04-16 DIAGNOSIS — G894 Chronic pain syndrome: Secondary | ICD-10-CM | POA: Diagnosis not present

## 2020-04-16 DIAGNOSIS — I5032 Chronic diastolic (congestive) heart failure: Secondary | ICD-10-CM

## 2020-04-16 DIAGNOSIS — J449 Chronic obstructive pulmonary disease, unspecified: Secondary | ICD-10-CM | POA: Diagnosis not present

## 2020-04-16 DIAGNOSIS — I4891 Unspecified atrial fibrillation: Secondary | ICD-10-CM | POA: Diagnosis not present

## 2020-04-16 MED ORDER — METOPROLOL SUCCINATE ER 50 MG PO TB24: 50.0000 mg | ORAL_TABLET | Freq: Every day | ORAL | 0 refills | Status: DC

## 2020-04-16 MED ORDER — OXYCODONE-ACETAMINOPHEN 5-325 MG PO TABS: 1.0000 | ORAL_TABLET | ORAL | 0 refills | Status: AC | PRN

## 2020-04-16 NOTE — Telephone Encounter (Signed)
Returned call to patient. States she was d/c from [facility] last evening. Does not have Rxs for metoprolol or oxycodone. No print and Print options selected. Please resend as Normal. Hubbard Hartshorn, BSN, RN-BC

## 2020-04-16 NOTE — Patient Outreach (Signed)
[facility]) Care Management  04/16/2020  Sharon Vasquez 05-18-1950 [id number]  [place] [facility] [ACO] Patient: [employer]  PCP: [facility]  Patient transitioned to [facility]. Plan: Will update disposition to [place].  Natividad Brood, RN BSN [facility] Liaison  [phone] business mobile phone Toll free office [phone]  Fax number: [phone] [name]

## 2020-04-16 NOTE — Telephone Encounter (Signed)
Requesting to speak with a nurse about meds. Please call pt back.  

## 2020-04-17 ENCOUNTER — Telehealth: Payer: Self-pay | Admitting: Internal Medicine

## 2020-04-19 NOTE — ED Provider Notes (Signed)
[facility] Provider Note   CSN: [id number] Arrival date & time: 04/10/20  0054     History Chief Complaint  Patient presents with  . Respiratory Distress    SHADONNA BENEDICK is a 70 y.o. female.  The history is provided by the patient.  Shortness of Breath Severity:  Moderate Onset quality:  Gradual Duration:  2 days Timing:  Constant Progression:  Worsening Chronicity:  New Context: not activity   Relieved by:  None tried Worsened by:  Nothing Ineffective treatments:  None tried Associated symptoms: no abdominal pain, no fever and no headaches        Past Medical History:  Diagnosis Date  . A-fib ([place])   . Anxiety   . Arthritis    "qwhre; joints, back" (04/17/2017)  . Benign breast cyst in female, left 01/08/2017   Found by Screening mammogram, evaluated by U/S on 01/08/17 and determined to be a benign simple breast cyst.  . Cellulitis of left lower leg 05/30/2017  . CHF (congestive heart failure) ([place])   . Chronic low back pain 08/21/2016  . Chronic lower back pain   . Chronic venous insufficiency    [name] 05/30/2017  . COPD (chronic obstructive pulmonary disease) ([place])   . Depression   . DVT (deep venous thrombosis) ([place]) 11/16/2016  . GERD (gastroesophageal reflux disease)   . Headache    "weekly for the last 3 months" (04/17/2017)  . Hyperlipidemia   . Hypertension   . Morbid obesity ([place])   . PE (pulmonary embolism)   . Pulmonary embolism ([place]) 09/21/2014  . Sleep apnea     Patient Active Problem List   Diagnosis Date Noted  . Acute respiratory failure with hypoxia ([place]) 04/10/2020  . Hypertensive urgency 04/10/2020  . Swallowing difficulty 11/10/2019  . Chronic respiratory failure with hypoxia ([place]) 09/30/2019  . Mixed incontinence, urge and stress (female) (female) 08/21/2019  . Overgrown toenails 07/01/2019  . Preventative health care 06/04/2019  . GERD (gastroesophageal reflux disease) 01/20/2019  . Coronary artery calcification  seen on CT scan 04/12/2018  . ILD (interstitial lung disease) ([place]) 02/13/2018  . Pain and numbness of right upper extremity 02/08/2018  . Long term current use of amiodarone 12/19/2017  . OSA (obstructive sleep apnea) 11/14/2017  . Chronic pain syndrome 07/27/2017  . Major depression, recurrent, chronic ([place]) 02/01/2017  . Osteoporosis 12/14/2016  . Drug induced constipation 08/21/2016  . Elevated blood sugar 07/19/2016  . Aortic atherosclerosis ([place]) 07/17/2016  . Chronic venous insufficiency 07/12/2016  . Chronic anticoagulation 07/12/2016  . Tobacco use 07/11/2016  . Acute exacerbation of chronic obstructive pulmonary disease (COPD) ([place])   . Acute diastolic (congestive) heart failure ([place])   . History of pulmonary embolism   . Atrial fibrillation with controlled ventricular response ([place])   . Vocal cord polyp 12/18/2015  . Laryngopharyngeal reflux 12/18/2015  . Morbid obesity ([place])   . Peripheral vascular disease ([place])   . Benign essential HTN   . Primary osteoarthritis of both knees 09/16/2014    Past Surgical History:  Procedure Laterality Date  . ABDOMINAL HYSTERECTOMY    . APPENDECTOMY    . BALLOON DILATION N/A 03/09/2020   Procedure: BALLOON DILATION;  Surgeon: Otis Brace, MD;  Location: [facility];  Service: Gastroenterology;  Laterality: N/A;  . BIOPSY  03/09/2020   Procedure: BIOPSY;  Surgeon: Otis Brace, MD;  Location: [facility];  Service: Gastroenterology;;  . BREAST CYST EXCISION Left    "six o'clock"  .  BREAST LUMPECTOMY Left   . CHOLECYSTECTOMY    . DILATION AND CURETTAGE OF UTERUS    . ESOPHAGOGASTRODUODENOSCOPY (EGD) WITH PROPOFOL N/A 03/09/2020   Procedure: ESOPHAGOGASTRODUODENOSCOPY (EGD) WITH PROPOFOL;  Surgeon: Otis Brace, MD;  Location: [facility];  Service: Gastroenterology;  Laterality: N/A;  . IR ABLATE LIVER CRYOABLATION  07/23/2019  . IR RADIOLOGIST EVAL & MGMT  07/18/2019  . TONSILLECTOMY AND ADENOIDECTOMY    . TUBAL  LIGATION       OB History   No obstetric history on file.     Family History  Problem Relation Age of Onset  . Breast cancer Mother   . Hypertension Mother   . Hyperlipidemia Mother   . Hypertension Maternal Grandfather   . Hyperlipidemia Maternal Grandfather     Social History   Tobacco Use  . Smoking status: Current Some Day Smoker    Packs/day: 0.10    Years: 55.00    Pack years: 5.50    Types: Cigarettes    Start date: 08/16/1961  . Smokeless tobacco: Never Used  . Tobacco comment: 3 cigs per day  Vaping Use  . Vaping Use: Never used  Substance Use Topics  . Alcohol use: No  . Drug use: No    Home Medications Prior to Admission medications   Medication Sig Start Date End Date Taking? Authorizing Provider  albuterol (PROVENTIL) (2.5 MG/3ML) 0.083% nebulizer solution TAKE 3 MLS(1 AMPULE) BY NEBULIZATION EVERY 4 HOURS AS NEEDED FOR [place] OR SHORTNESS OF BREATH Patient taking differently: Take 2.5 mg by nebulization every 4 (four) hours as needed for wheezing or shortness of breath. 01/20/19  Yes Marcelyn Bruins, MD  albuterol (VENTOLIN HFA) 108 (90 Base) MCG/ACT inhaler USE 2 INHALATIONS BY MOUTH  EVERY 6 HOURS AS NEEDED FOR WHEEZING OR SHORTNESS OF  BREATH Patient taking differently: Inhale 2 puffs into the lungs every 6 (six) hours as needed for shortness of breath or wheezing. 02/05/20  Yes Madalyn Rob, MD  aspirin EC 81 MG tablet Take 81 mg by mouth daily.   Yes [provider]  atorvastatin (LIPITOR) 40 MG tablet Take 1 tablet (40 mg total) by mouth daily. 12/24/19  Yes Christian, Rylee, MD  esomeprazole (NEXIUM) 40 MG capsule Take 1 capsule (40 mg total) by mouth daily as needed (for heartburn or indigestion). Patient taking differently: Take 40 mg by mouth daily. 01/20/19  Yes Marcelyn Bruins, MD  Fluticasone-Salmeterol (ADVAIR DISKUS) 250-50 MCG/DOSE AEPB Inhale 1 puff into the lungs 2 (two) times daily. Rinse mouth after each use 12/05/19  Yes  Christian, Rylee, MD  gabapentin (NEURONTIN) 400 MG capsule TAKE 1 CAPSULE BY MOUTH  TWICE DAILY Patient taking differently: Take 400 mg by mouth 2 (two) times daily. 02/05/20  Yes Madalyn Rob, MD  ibuprofen (ADVIL) 200 MG tablet Take 800 mg by mouth every 6 (six) hours as needed (inflammation).   Yes [provider]  mirabegron ER (MYRBETRIQ) 50 MG TB24 tablet Take 50 mg by mouth daily.   Yes [provider]  Multiple Vitamin (MULTIVITAMIN WITH MINERALS) TABS tablet Take 1 tablet by mouth daily.   Yes [provider]  potassium chloride (KLOR-CON) 10 MEQ tablet TAKE 2 TABLETS(20 MEQ) BY MOUTH TWICE DAILY Patient taking differently: Take 10-20 mEq by mouth daily. 09/18/19  Yes Rigoberto Noel, MD  PRESCRIPTION MEDICATION Inhale into the lungs at bedtime. cpap   Yes [provider]  rivaroxaban (XARELTO) 20 MG TABS tablet Take 1 tablet (20 mg total)  by mouth every morning. 01/23/20  Yes Iona Beard, MD  tiotropium (SPIRIVA HANDIHALER) 18 MCG inhalation capsule PLACE 1 CAPSULE INTO INHALER AND INHALE DAILY Patient taking differently: Place 18 mcg into inhaler and inhale daily. 12/05/19  Yes Christian, Rylee, MD  torsemide (DEMADEX) 20 MG tablet TAKE 2 TABLETS BY MOUTH DAILY Patient taking differently: Take 20-40 mg by mouth daily. 01/26/20  Yes [place], Carley D, DO  benazepril (LOTENSIN) 40 MG tablet Take 0.5 tablets (20 mg total) by mouth daily. 04/15/20 04/15/21  Andrew Au, MD  guaiFENesin (ROBITUSSIN) 100 MG/5ML SOLN Take 10 mLs (200 mg total) by mouth 2 (two) times daily. 04/15/20   Andrew Au, MD  metoprolol succinate (TOPROL-XL) 50 MG 24 hr tablet Take 1 tablet (50 mg total) by mouth daily. Take with or immediately following a meal. 04/16/20   [place], Carley D, DO  oxyCODONE-acetaminophen (PERCOCET/ROXICET) 5-325 MG tablet Take 1 tablet by mouth every 4 (four) hours as needed for up to 5 days. 04/16/20 04/21/20  [place], Carley D, DO  predniSONE  (DELTASONE) 10 MG tablet Take 3 tablets (30 mg total) by mouth daily with breakfast for 4 days. 04/17/20 04/21/20  Andrew Au, MD  predniSONE (DELTASONE) 10 MG tablet Take 2 tablets (20 mg total) by mouth daily with breakfast for 4 days. 04/22/20 04/26/20  Andrew Au, MD  predniSONE (DELTASONE) 10 MG tablet Take 1 tablet (10 mg total) by mouth daily with breakfast for 4 days. 04/26/20 04/30/20  Andrew Au, MD  predniSONE (DELTASONE) 5 MG tablet Take 1 tablet (5 mg total) by mouth daily with breakfast for 4 days. 05/01/20 05/05/20  Andrew Au, MD    Allergies    Patient has no known allergies.  Review of Systems   Review of Systems  Constitutional: Negative for fever.  Respiratory: Positive for shortness of breath.   Gastrointestinal: Negative for abdominal pain.  Neurological: Negative for headaches.  All other systems reviewed and are negative.   Physical Exam Updated Vital Signs BP (!) 168/106 (BP Location: Left Arm)   Pulse 91   Temp 97.6 F (36.4 C) (Oral)   Resp 20   Wt (!) 167.1 kg   SpO2 94%   BMI 47.30 kg/m   Physical Exam Vitals and nursing note reviewed.  Constitutional:      Appearance: She is well-developed and well-nourished.  HENT:     Head: Normocephalic and atraumatic.     Mouth/Throat:     Mouth: Mucous membranes are moist.  Eyes:     Pupils: Pupils are equal, round, and reactive to light.  Cardiovascular:     Rate and Rhythm: Regular rhythm. Tachycardia present.  Pulmonary:     Effort: No respiratory distress.     Breath sounds: No stridor. Rales present.  Abdominal:     General: There is no distension.  Musculoskeletal:     Cervical back: Normal range of motion.  Skin:    General: Skin is warm and dry.  Neurological:     General: No focal deficit present.     Mental Status: She is alert.     ED Results / Procedures / Treatments   Labs (all labs ordered are listed, but only abnormal results are displayed) Labs Reviewed  CBC WITH  DIFFERENTIAL/PLATELET - Abnormal; Notable for the following components:      Result Value   WBC 14.8 (*)    Platelets 551 (*)    Neutro Abs 7.8 (*)  Lymphs Abs 6.0 (*)    All other components within normal limits  COMPREHENSIVE METABOLIC PANEL - Abnormal; Notable for the following components:   CO2 21 (*)    Glucose, Bld 289 (*)    Creatinine, Ser 1.12 (*)    GFR, Estimated 53 (*)    All other components within normal limits  BRAIN NATRIURETIC PEPTIDE - Abnormal; Notable for the following components:   B Natriuretic Peptide 672.7 (*)    All other components within normal limits  BASIC METABOLIC PANEL - Abnormal; Notable for the following components:   Potassium 3.3 (*)    Glucose, Bld 110 (*)    Creatinine, Ser 1.06 (*)    Calcium 8.5 (*)    GFR, Estimated 57 (*)    All other components within normal limits  CBC - Abnormal; Notable for the following components:   RBC 3.71 (*)    Hemoglobin 11.2 (*)    HCT 33.4 (*)    All other components within normal limits  MAGNESIUM - Abnormal; Notable for the following components:   Magnesium 1.6 (*)    All other components within normal limits  BASIC METABOLIC PANEL - Abnormal; Notable for the following components:   Glucose, Bld 133 (*)    Calcium 8.7 (*)    All other components within normal limits  CBC - Abnormal; Notable for the following components:   RBC 3.57 (*)    Hemoglobin 10.3 (*)    HCT 32.0 (*)    All other components within normal limits  BASIC METABOLIC PANEL - Abnormal; Notable for the following components:   Glucose, Bld 132 (*)    Creatinine, Ser 1.02 (*)    GFR, Estimated 59 (*)    All other components within normal limits  CBC - Abnormal; Notable for the following components:   RBC 3.61 (*)    Hemoglobin 10.9 (*)    HCT 31.7 (*)    All other components within normal limits  BASIC METABOLIC PANEL - Abnormal; Notable for the following components:   Glucose, Bld 178 (*)    Creatinine, Ser 1.09 (*)     Calcium 8.7 (*)    GFR, Estimated 55 (*)    All other components within normal limits  CBC - Abnormal; Notable for the following components:   Hemoglobin 11.5 (*)    HCT 34.3 (*)    Platelets 408 (*)    All other components within normal limits  BASIC METABOLIC PANEL - Abnormal; Notable for the following components:   Glucose, Bld 138 (*)    Calcium 8.7 (*)    All other components within normal limits  CBC - Abnormal; Notable for the following components:   RBC 3.74 (*)    Hemoglobin 11.3 (*)    HCT 32.9 (*)    Platelets 405 (*)    All other components within normal limits  I-STAT ARTERIAL BLOOD GAS, ED - Abnormal; Notable for the following components:   pH, Arterial 7.255 (*)    pCO2 arterial 54.4 (*)    pO2, Arterial 60 (*)    Acid-base deficit 4.0 (*)    HCT 35.0 (*)    Hemoglobin 11.9 (*)    All other components within normal limits  TROPONIN I (HIGH SENSITIVITY) - Abnormal; Notable for the following components:   Troponin I (High Sensitivity) 33 (*)    All other components within normal limits  RESP PANEL BY RT-PCR (FLU A&B, COVID) ARPGX2  SARS CORONAVIRUS 2 (TAT 6-24 HRS)  MAGNESIUM  MAGNESIUM  MAGNESIUM  MAGNESIUM  TROPONIN I (HIGH SENSITIVITY)    EKG EKG Interpretation  Date/Time:  Saturday April 10 2020 01:04:53 EST Ventricular Rate:  183 PR Interval:    QRS Duration: 99 QT Interval:  284 QTC Calculation: 496 R Axis:   -39 Text Interpretation: Atrial fibrillation with rapid V-rate Ventricular premature complex Left axis deviation Probable anteroseptal infarct, old Nonspecific T abnormalities, lateral leads Confirmed by Merrily Pew [id number]) on 04/10/2020 3:21:08 AM   Radiology No results found.  Procedures .Critical Care Performed by: Merrily Pew, MD Authorized by: Merrily Pew, MD   Critical care provider statement:    Critical care time (minutes):  45   Critical care was necessary to treat or prevent imminent or life-threatening deterioration  of the following conditions:  Respiratory failure   Critical care was time spent personally by me on the following activities:  Discussions with consultants, evaluation of patient's response to treatment, examination of patient, ordering and performing treatments and interventions, ordering and review of laboratory studies, ordering and review of radiographic studies, pulse oximetry, re-evaluation of patient's condition, obtaining history from patient or surrogate and review of old charts   (including critical care time)  Medications Ordered in ED Medications  predniSONE (DELTASONE) tablet 40 mg (40 mg Oral Given 04/14/20 1107)  azithromycin (ZITHROMAX) tablet 250 mg (250 mg Oral Given 04/14/20 1107)  LORazepam (ATIVAN) injection 2 mg (2 mg Intravenous Given 04/10/20 0122)  diltiazem (CARDIZEM) 1 mg/mL load via infusion 20 mg (20 mg Intravenous Bolus from Bag 04/10/20 0128)  furosemide (LASIX) injection 80 mg (80 mg Intravenous Given 04/10/20 0121)  lactated ringers bolus 1,000 mL (0 mLs Intravenous Stopped 04/10/20 1328)  magnesium sulfate IVPB 2 g 50 mL (0 g Intravenous Stopped 04/12/20 1930)  magnesium sulfate IVPB 2 g 50 mL (0 g Intravenous Stopped 04/13/20 0755)    ED Course  I have reviewed the triage vital signs and the nursing notes.  Pertinent labs & imaging results that were available during my care of the patient were reviewed by me and considered in my medical decision making (see chart for details).    MDM Rules/Calculators/A&P                          Pulmonary edema with associated afib RVR. Started on diltiazem drip, bipap with signficant improvement. Admitted for same.   Final Clinical Impression(s) / ED Diagnoses Final diagnoses:  Acute pulmonary edema (HCC)  Atrial fibrillation with controlled ventricular response (HCC)  Acute respiratory failure with hypoxia ([place])    Rx / DC Orders ED Discharge Orders         Ordered    predniSONE (DELTASONE) 5 MG tablet  Daily with  breakfast        04/15/20 1444    predniSONE (DELTASONE) 10 MG tablet  Daily with breakfast        04/15/20 1444    predniSONE (DELTASONE) 10 MG tablet  Daily with breakfast        04/15/20 1444    predniSONE (DELTASONE) 10 MG tablet  Daily with breakfast        04/15/20 1444    benazepril (LOTENSIN) 40 MG tablet  Daily        04/15/20 1444    metoprolol succinate (TOPROL-XL) 50 MG 24 hr tablet  Daily,   Status:  Discontinued        04/15/20 1444    guaiFENesin (ROBITUSSIN) 100 MG/5ML  SOLN  2 times daily        04/15/20 1444    Increase activity slowly        04/15/20 1444    Diet - low sodium heart healthy        04/15/20 1444    Call MD for:  extreme fatigue        04/15/20 1444    Call MD for:  difficulty breathing, headache or visual disturbances        04/15/20 1444    Discharge instructions       Comments: Ms. Grissom, it has been a pleasure taking care of you.  We treated you for a COPD flare.  We are discharging you to skilled nursing facility for some rehab before you go home, and they can help you set up some home health afterwards.  Here your discharge instructions.  1.  Continue working on your functional status at rehab 2.  I have prescribed you a steroid taper over the next 16 days.  Follow the instructions to gradually decrease her daily dose over time. 3.  Your blood pressure has been on the low side here, so I decreased the dose of some of your blood pressure medications.  I have half the dose of your Toprol and your benazepril.  We may increase these again in clinic if your blood pressure goes back up again. 4.  I am sorry you missed your pain clinic appointment.  Please reschedule soon as you can.  I am discharging you with some medications, and call our clinic if you run out before then.   04/15/20 1444    oxyCODONE-acetaminophen (PERCOCET/ROXICET) 5-325 MG tablet  Every 4 hours PRN,   Status:  Discontinued        04/15/20 1445           Vince Ainsley, Corene Cornea,  MD 04/19/20 1513

## 2020-04-21 ENCOUNTER — Telehealth: Payer: Self-pay | Admitting: Pulmonary Disease

## 2020-04-21 NOTE — Telephone Encounter (Signed)
Pt was recently in the hospital and discharged 04/15/20. Pt was discharged home on 2L O2 from hospital.  Pt does not have a f/u appt scheduled. Since pt was recently in the hospital, she needs to be scheduled for a hospital follow up visit with either Dr. Elsworth Soho or one of our APPs.  Called and spoke with pt stating to her that we should get her scheduled for a hospital follow up appt and pt stated that she is currently in a nursing home for rehab.  Dr. Elsworth Soho, please advise if it would be okay for [place] to schedule pt a virtual visit for the hospital follow up appt due to pt not knowing how long she will be in the nursing home.

## 2020-04-22 DIAGNOSIS — I10 Essential (primary) hypertension: Secondary | ICD-10-CM | POA: Diagnosis not present

## 2020-04-22 DIAGNOSIS — J449 Chronic obstructive pulmonary disease, unspecified: Secondary | ICD-10-CM | POA: Diagnosis not present

## 2020-04-22 DIAGNOSIS — I4891 Unspecified atrial fibrillation: Secondary | ICD-10-CM | POA: Diagnosis not present

## 2020-04-22 NOTE — Telephone Encounter (Signed)
Called and spoke to pt. Informed her of the recs per Dr. Elsworth Soho. Pt states she will call us back to schedule. Will keep encounter open to follow up on.

## 2020-04-22 NOTE — Telephone Encounter (Signed)
Okay to schedule posthospital office visit in 4 weeks with APP/me She would need in person visit since she will need evaluation for oxygen

## 2020-04-23 ENCOUNTER — Encounter: Payer: Medicare Other | Admitting: Student

## 2020-04-23 ENCOUNTER — Ambulatory Visit: Payer: Medicare Other | Admitting: *Deleted

## 2020-04-23 DIAGNOSIS — I5032 Chronic diastolic (congestive) heart failure: Secondary | ICD-10-CM

## 2020-04-23 DIAGNOSIS — I10 Essential (primary) hypertension: Secondary | ICD-10-CM

## 2020-04-23 DIAGNOSIS — I480 Paroxysmal atrial fibrillation: Secondary | ICD-10-CM

## 2020-04-23 NOTE — Chronic Care Management (AMB) (Signed)
   04/23/2020  Sharon Vasquez [birth year] [id number]  Successful outreach to patient to follow up on [place] concerns. patient says she realizes she needs to be in rehab but it's depressing.  Says she anticipates she will be in [place] for rehabilitation  until the end of the month. She says she is on oxygen and will be O2 dependent chronically. Says she gets short of breath with minimal exertion.  She says the staff will help her apply for Medicaid and if she qualifies for Medicaid then assist with securing PCS.    Plan: Will call patient for transition of care assessment when notification received that she has been discharged from the SNF to home.  Kelli Churn RN, CCM, [facility] RN Care Manager [phone]

## 2020-04-26 NOTE — Progress Notes (Signed)
Entered in error

## 2020-04-27 DIAGNOSIS — I509 Heart failure, unspecified: Secondary | ICD-10-CM | POA: Diagnosis not present

## 2020-04-27 DIAGNOSIS — E785 Hyperlipidemia, unspecified: Secondary | ICD-10-CM | POA: Diagnosis not present

## 2020-04-27 DIAGNOSIS — J9610 Chronic respiratory failure, unspecified whether with hypoxia or hypercapnia: Secondary | ICD-10-CM | POA: Diagnosis not present

## 2020-04-27 DIAGNOSIS — I4891 Unspecified atrial fibrillation: Secondary | ICD-10-CM | POA: Diagnosis not present

## 2020-04-27 DIAGNOSIS — K219 Gastro-esophageal reflux disease without esophagitis: Secondary | ICD-10-CM | POA: Diagnosis not present

## 2020-04-27 DIAGNOSIS — G4733 Obstructive sleep apnea (adult) (pediatric): Secondary | ICD-10-CM | POA: Diagnosis not present

## 2020-04-27 DIAGNOSIS — I10 Essential (primary) hypertension: Secondary | ICD-10-CM | POA: Diagnosis not present

## 2020-04-27 DIAGNOSIS — J449 Chronic obstructive pulmonary disease, unspecified: Secondary | ICD-10-CM | POA: Diagnosis not present

## 2020-04-29 NOTE — Telephone Encounter (Signed)
Lmtcb for pt.   Schedule HFU with RA or APP for 3 weeks from now.

## 2020-05-03 ENCOUNTER — Ambulatory Visit: Payer: Medicare Other | Admitting: *Deleted

## 2020-05-03 DIAGNOSIS — R32 Unspecified urinary incontinence: Secondary | ICD-10-CM

## 2020-05-03 DIAGNOSIS — J449 Chronic obstructive pulmonary disease, unspecified: Secondary | ICD-10-CM

## 2020-05-03 DIAGNOSIS — I5032 Chronic diastolic (congestive) heart failure: Secondary | ICD-10-CM

## 2020-05-03 DIAGNOSIS — I480 Paroxysmal atrial fibrillation: Secondary | ICD-10-CM

## 2020-05-03 DIAGNOSIS — I10 Essential (primary) hypertension: Secondary | ICD-10-CM

## 2020-05-03 NOTE — Chronic Care Management (AMB) (Signed)
   05/03/2020  Sharon Vasquez [birth year] [id number]   Patient called and left message for this CCM RN. She states she remains at [facility] [phone]) for rehabilitation after her hospitalization at [facility]  from 1/9-1/13/22 for COPD exacerbation, Respiratory Bronchiolitis associated ILD and Chronic Pain Syndrome. She states she is not receiving adequate pain relief as she is not allowed more than 2 oxycodone per every 24 hours which does not correlate with the Oxycodone Rx schedule she has been taking for years of every 4 hours. She says she is not able to get to her providers ( primary nor pain management) as she still needs ongoing rehabilitation at the SNF. She is requesting that her primary care provider at the clinic assist with this issue.  Plan: Will message blue team regarding patient's concerns.   Kelli Churn RN, CCM, [facility] RN Care Manager [phone]

## 2020-05-04 ENCOUNTER — Other Ambulatory Visit: Payer: Self-pay | Admitting: Student

## 2020-05-04 ENCOUNTER — Encounter: Payer: Self-pay | Admitting: Emergency Medicine

## 2020-05-04 MED ORDER — OXYCODONE-ACETAMINOPHEN 5-325 MG PO TABS: 1.0000 | ORAL_TABLET | ORAL | 0 refills | Status: AC | PRN

## 2020-05-04 NOTE — Telephone Encounter (Signed)
Letter printed and put in the mail.

## 2020-05-04 NOTE — Telephone Encounter (Signed)
Left detailed message for pt to call back to schedule a HFU appt with RA or APP. Due to several unsuccessful attempts to reach pt a letter will be mailed to pt to inform her of the need to make an appt and the encounter will be closed.   Triage, please print and mail letter (letter already created). Encounter can be closed once complete.

## 2020-05-04 NOTE — Progress Notes (Signed)
Patient called requesting refill on pain medications. She missed her pain clinic appointment due to hospitalization during which I cared for her, and she afterwards went to SNF. She has rescheduled her appointment for 2/8 and requests enough to get her through that time. She was chronic osteoarthritis and the medication helps with her functional status. PDMP reviewed and appropriate. Will write short course of her home oxycodone-acetaminophen.

## 2020-05-06 ENCOUNTER — Ambulatory Visit: Payer: Medicare Other | Admitting: Pulmonary Disease

## 2020-05-07 ENCOUNTER — Telehealth: Payer: Self-pay | Admitting: *Deleted

## 2020-05-07 ENCOUNTER — Telehealth: Payer: Medicare Other

## 2020-05-07 NOTE — Telephone Encounter (Signed)
  Chronic Care Management   Outreach Note  05/07/2020 Name: Sharon Vasquez [name]: [id number] DOB: 1950/06/13  Referred by: Andrew Au, MD Reason for referral : Chronic Care Management (HF, a fib, HTN, HLD, hx of PE and DVT, PVD, COPD, ILD, OSA, GERD, OA, anxiety/depression, chronic pain syndrome, incontinence, morbid obesity )   An unsuccessful telephone outreach was attempted today. Purpose of call was to complete trasnio of care assessment as patient was to discharge from SNF to home on 05/05/20. The patient was referred to the case management team for assistance with care management and care coordination.   Follow Up Plan: A HIPAA compliant phone message was left for the patient providing contact information and requesting a return call.  The care management team will reach out to the patient again over the next 7-14 days.   Kelli Churn RN, CCM, [facility] RN Care Manager [phone]

## 2020-05-10 ENCOUNTER — Ambulatory Visit: Payer: Medicare Other | Admitting: *Deleted

## 2020-05-10 DIAGNOSIS — J449 Chronic obstructive pulmonary disease, unspecified: Secondary | ICD-10-CM

## 2020-05-10 DIAGNOSIS — M170 Bilateral primary osteoarthritis of knee: Secondary | ICD-10-CM

## 2020-05-10 DIAGNOSIS — I5032 Chronic diastolic (congestive) heart failure: Secondary | ICD-10-CM

## 2020-05-10 DIAGNOSIS — I10 Essential (primary) hypertension: Secondary | ICD-10-CM

## 2020-05-10 DIAGNOSIS — R32 Unspecified urinary incontinence: Secondary | ICD-10-CM

## 2020-05-10 DIAGNOSIS — G894 Chronic pain syndrome: Secondary | ICD-10-CM

## 2020-05-10 DIAGNOSIS — I480 Paroxysmal atrial fibrillation: Secondary | ICD-10-CM

## 2020-05-10 DIAGNOSIS — R1319 Other dysphagia: Secondary | ICD-10-CM

## 2020-05-10 NOTE — Chronic Care Management (AMB) (Signed)
Chronic Care Management   CCM RN Visit Note  05/10/2020 Name: Sharon Vasquez [name]: [id number] DOB: [birth year]  Subjective: Sharon Vasquez is a 70 y.o. year old female who is a primary care patient of [name], [name], MD. The care management team was consulted for assistance with disease management and care coordination needs.    Engaged with patient by telephone for follow up visit in response to provider referral for case management and/or care coordination services.   Consent to Services:  The patient was given information about Chronic Care Management services, agreed to services, and gave verbal consent prior to initiation of services.  Please see initial visit note for detailed documentation.   Patient agreed to services and verbal consent obtained.   Assessment: Review of patient past medical history, allergies, medications, health status, including review of consultants reports, laboratory and other test data, was performed as part of comprehensive evaluation and provision of chronic care management services.   SDOH (Social Determinants of Health) assessments and interventions performed:    CCM Care Plan  No Known Allergies  Outpatient Encounter Medications as of 05/10/2020  Medication Sig Note  . albuterol (PROVENTIL) (2.5 MG/3ML) 0.083% nebulizer solution TAKE 3 MLS(1 AMPULE) BY NEBULIZATION EVERY 4 HOURS AS NEEDED FOR WHHEZING OR SHORTNESS OF BREATH (Patient taking differently: Take 2.5 mg by nebulization every 4 (four) hours as needed for wheezing or shortness of breath.)   . albuterol (VENTOLIN HFA) 108 (90 Base) MCG/ACT inhaler USE 2 INHALATIONS BY MOUTH  EVERY 6 HOURS AS NEEDED FOR WHEEZING OR SHORTNESS OF  BREATH (Patient taking differently: Inhale 2 puffs into the lungs every 6 (six) hours as needed for shortness of breath or wheezing.)   . aspirin EC 81 MG tablet Take 81 mg by mouth daily.   [name] atorvastatin (LIPITOR) 40 MG tablet Take 1 tablet (40 mg total) by mouth daily.    . benazepril (LOTENSIN) 40 MG tablet Take 0.5 tablets (20 mg total) by mouth daily.   [name] esomeprazole (NEXIUM) 40 MG capsule Take 1 capsule (40 mg total) by mouth daily as needed (for heartburn or indigestion). (Patient taking differently: Take 40 mg by mouth daily.)   . Fluticasone-Salmeterol (ADVAIR DISKUS) 250-50 MCG/DOSE AEPB Inhale 1 puff into the lungs 2 (two) times daily. Rinse mouth after each use   . gabapentin (NEURONTIN) 400 MG capsule TAKE 1 CAPSULE BY MOUTH  TWICE DAILY (Patient taking differently: Take 400 mg by mouth 2 (two) times daily.)   . guaiFENesin (ROBITUSSIN) 100 MG/5ML SOLN Take 10 mLs (200 mg total) by mouth 2 (two) times daily.   [name] ibuprofen (ADVIL) 200 MG tablet Take 800 mg by mouth every 6 (six) hours as needed (inflammation).   . metoprolol succinate (TOPROL-XL) 50 MG 24 hr tablet TAKE 1 TABLET(50 MG) BY MOUTH DAILY WITH OR IMMEDIATELY FOLLOWING A MEAL   . mirabegron ER (MYRBETRIQ) 50 MG TB24 tablet Take 50 mg by mouth daily. 04/10/2020: Samples from MD  . Multiple Vitamin (MULTIVITAMIN WITH MINERALS) TABS tablet Take 1 tablet by mouth daily.   [name] oxyCODONE-acetaminophen (PERCOCET/ROXICET) 5-325 MG tablet Take 1 tablet by mouth every 4 (four) hours as needed for up to 8 days for severe pain.   . potassium chloride (KLOR-CON) 10 MEQ tablet TAKE 2 TABLETS(20 MEQ) BY MOUTH TWICE DAILY (Patient taking differently: Take 10-20 mEq by mouth daily.)   . PRESCRIPTION MEDICATION Inhale into the lungs at bedtime. cpap   . rivaroxaban (XARELTO) 20 MG TABS tablet Take  1 tablet (20 mg total) by mouth every morning.   . tiotropium (SPIRIVA HANDIHALER) 18 MCG inhalation capsule PLACE 1 CAPSULE INTO INHALER AND INHALE DAILY (Patient taking differently: Place 18 mcg into inhaler and inhale daily.)   . torsemide (DEMADEX) 20 MG tablet TAKE 2 TABLETS BY MOUTH DAILY (Patient taking differently: Take 20-40 mg by mouth daily.)    No facility-administered encounter medications on file as of  05/10/2020.    Patient Active Problem List   Diagnosis Date Noted  . Acute respiratory failure with hypoxia ([place]) 04/10/2020  . Hypertensive urgency 04/10/2020  . Swallowing difficulty 11/10/2019  . Chronic respiratory failure with hypoxia ([place]) 09/30/2019  . Mixed incontinence, urge and stress (female) (female) 08/21/2019  . Overgrown toenails 07/01/2019  . Preventative health care 06/04/2019  . GERD (gastroesophageal reflux disease) 01/20/2019  . Coronary artery calcification seen on CT scan 04/12/2018  . ILD (interstitial lung disease) ([place]) 02/13/2018  . Pain and numbness of right upper extremity 02/08/2018  . Long term current use of amiodarone 12/19/2017  . OSA (obstructive sleep apnea) 11/14/2017  . Chronic pain syndrome 07/27/2017  . Major depression, recurrent, chronic ([place]) 02/01/2017  . Osteoporosis 12/14/2016  . Drug induced constipation 08/21/2016  . Elevated blood sugar 07/19/2016  . Aortic atherosclerosis ([place]) 07/17/2016  . Chronic venous insufficiency 07/12/2016  . Chronic anticoagulation 07/12/2016  . Tobacco use 07/11/2016  . Acute exacerbation of chronic obstructive pulmonary disease (COPD) ([place])   . Acute diastolic (congestive) heart failure ([place])   . History of pulmonary embolism   . Atrial fibrillation with controlled ventricular response ([place])   . Vocal cord polyp 12/18/2015  . Laryngopharyngeal reflux 12/18/2015  . Morbid obesity ([place])   . Peripheral vascular disease ([place])   . Benign essential HTN   . Primary osteoarthritis of both knees 09/16/2014    Conditions to be addressed/monitored: HF, a fib, HTN, HLD, hx of PE and DVT, PVD, COPD, ILD, OSA, GERD, OA, anxiety/depression, chronic pain syndrome, incontinence, morbid obesity   Care Plan : CCM RN- COPD (Adult)  Updates made by Barrington Ellison, RN since 05/10/2020 12:00 AM    Problem: Psychological Adjustment to Diagnosis (COPD)     Problem: Disease Progression (COPD)     Problem: Symptom  Exacerbation (COPD)     Goal: Symptom Exacerbation Prevented or Minimized   Start Date: 06/24/2019  Expected End Date: 12/24/2020  This Visit's Progress: On track  Priority: High  Note:   CARE PLAN ENTRY (see longitudinal plan of care for additional care plan information)  Current Barriers:  . Chronic Disease Management support, education, and care coordination needs related to Atrial Fibrillation, CHF, HTN, and COPD- 05/10/20- spoke with patient via phone to complete follow up assessment and transition of care assessment, she was discharged to her apartment from [facility] on 05/06/20 after an approximated 3 week rehab stay following hospitalization from 1/8-1/13 for COPD exacerbation with chronic interstitial lung disease, states she is doing well, glad to be back in her apartment as the stay in the rehab facility was not pleasant and back to baseline with ADLS and IADLS, she says her sister stayed with her the first 3 days she came home from the SNF and she will return today to stay with her, she says she did not qualify for home O2 so she is using her neighbors O2 concentrator to use O2 prn, she says she mainly becomes short of breath with exertion, she says she has follow up appointments  this week with pain management, pulmonary and primary care and will make a follow up with her cardiologist soon  Clinical Goal(s) related to Atrial Fibrillation, CHF, HTN, and COPD:  Over the next 30 days, patient will:  . Work with the care management team to address educational, disease management, and care coordination needs  . Begin or continue self health monitoring activities as directed today Measure and record blood pressure 5-7 times per week and assess swelling in lower extremities daily, check O2 sats when short of breath  . Call provider office for new or worsened signs and symptoms Blood pressure findings outside established parameters, breathing outside of baseline,  Shortness of breath, and  New or worsened symptom related to CHF and COPD . Call care management team with questions or concerns . Verbalize basic understanding of patient centered plan of care established today  Interventions related to Atrial Fibrillation, CHF, HTN, and COPD:  . Appropriate assessments completed . Reviewed medications with patient and assessed medication taking behavior;  if indicated, discussed importance of medication adherence  . Reviewed upcoming appointments and ensured she has transportation . Discussed plans with patient for ongoing care management follow up and ensured patient has contact number for CCM team and clinic  Patient Self Care Activities related to Atrial Fibrillation, CHF, HTN, and COPD:  . Patient is unable to independently self-manage chronic health conditions       Plan:The care management team will reach out to the patient again over the next 30-60 days.   Kelli Churn RN, CCM, [facility] RN Care Manager [phone]

## 2020-05-10 NOTE — Patient Instructions (Signed)
Visit Information It was nice speaking with you today. PATIENT GOALS: Patient Care Plan: CCM RN- COPD (Adult)    Problem Identified: Psychological Adjustment to Diagnosis (COPD)     Problem Identified: Disease Progression (COPD)     Problem Identified: Symptom Exacerbation (COPD)     Goal: Symptom Exacerbation Prevented or Minimized   Start Date: 06/24/2019  Expected End Date: 12/24/2020  This Visit's Progress: On track  Priority: High  Note:   CARE PLAN ENTRY (see longitudinal plan of care for additional care plan information)  Current Barriers:  . Chronic Disease Management support, education, and care coordination needs related to Atrial Fibrillation, CHF, HTN, and COPD- 05/10/20- spoke with patient via phone to complete follow up assessment and transition of care assessment, she was discharged to her apartment from [facility] on 05/06/20 after an approximated 3 week rehab stay following hospitalization from 1/8-1/13 for COPD exacerbation with chronic interstitial lung disease, states she is doing well, glad to be back in her apartment as the stay in the rehab facility was not pleasant and back to baseline with ADLS and IADLS, she says her sister stayed with her the first 3 days she came home from the SNF and she will return today to stay with her, she says she did not qualify for home O2 so she is using her neighbors O2 concentrator to use O2 prn, she says she mainly becomes short of breath with exertion, she says she has follow up appointments this week with pain management, pulmonary and primary care and will make a follow up with her cardiologist soon  Clinical Goal(s) related to Atrial Fibrillation, CHF, HTN, and COPD:  Over the next 30 days, patient will:  . Work with the care management team to address educational, disease management, and care coordination needs  . Begin or continue self health monitoring activities as directed today Measure and record blood pressure 5-7 times  per week and assess swelling in lower extremities daily, check O2 sats when short of breath  . Call provider office for new or worsened signs and symptoms Blood pressure findings outside established parameters, breathing outside of baseline,  Shortness of breath, and New or worsened symptom related to CHF and COPD . Call care management team with questions or concerns . Verbalize basic understanding of patient centered plan of care established today  Interventions related to Atrial Fibrillation, CHF, HTN, and COPD:  . Appropriate assessments completed . Reviewed medications with patient and assessed medication taking behavior;  if indicated, discussed importance of medication adherence  . Reviewed upcoming appointments and ensured she has transportation . Discussed plans with patient for ongoing care management follow up and ensured patient has contact number for CCM team and clinic  Patient Self Care Activities related to Atrial Fibrillation, CHF, HTN, and COPD:  . Patient is unable to independently self-manage chronic health conditions       The patient verbalized understanding of instructions, educational materials, and care plan provided today and declined offer to receive copy of patient instructions, educational materials, and care plan.   The care management team will reach out to the patient again over the next 30-60 days.   Kelli Churn RN, CCM, [facility] RN Care Manager [phone]

## 2020-05-11 DIAGNOSIS — Z79899 Other long term (current) drug therapy: Secondary | ICD-10-CM | POA: Diagnosis not present

## 2020-05-11 DIAGNOSIS — M7989 Other specified soft tissue disorders: Secondary | ICD-10-CM | POA: Diagnosis not present

## 2020-05-11 DIAGNOSIS — M170 Bilateral primary osteoarthritis of knee: Secondary | ICD-10-CM | POA: Diagnosis not present

## 2020-05-11 NOTE — Progress Notes (Signed)
Virtual Visit via Telephone Note   This visit type was conducted due to national recommendations for restrictions regarding the COVID-19 Pandemic (e.g. social distancing) in an effort to limit this patient's exposure and mitigate transmission in our community.  Due to her co-morbid illnesses, this patient is at least at moderate risk for complications without adequate follow up.  This format is felt to be most appropriate for this patient at this time.  The patient did not have access to video technology/had technical difficulties with video requiring transitioning to audio format only (telephone).  All issues noted in this document were discussed and addressed.  No physical exam could be performed with this format.  Please refer to the patient's chart for her  consent to telehealth for [facility].  Evaluation Performed:  Follow-up visit  This visit type was conducted due to national recommendations for restrictions regarding the COVID-19 Pandemic (e.g. social distancing).  This format is felt to be most appropriate for this patient at this time.  All issues noted in this document were discussed and addressed.  No physical exam was performed (except for noted visual exam findings with Video Visits).  Please refer to the patient's chart (MyChart message for video visits and phone note for telephone visits) for the patient's consent to telehealth for [place]  Date:  05/12/2020   ID:  Sharon Vasquez, DOB Apr 16, 1950, [name] [id number]  Patient Location:  [place] [id number]   Provider location:     [place] [address] Office [phone] Fax [phone]   PCP:  Andrew Au, MD  Cardiologist:  Quay Burow, MD  Electrophysiologist:  None   Chief Complaint: Follow-up for hypertension  History of Present Illness:    Sharon Vasquez is a 70 y.o. female who presents via audio/video  conferencing for a telehealth visit today.  Patient verified DOB and address.  She has a PMH of PVD, benign essential hypertension, chronic venous insufficiency, aortic atherosclerosis, acute diastolic CHF, atrial fibrillation, and morbid obesity.  She was seen and evaluated by Dr. Alvester Chou 07/12/2016 when she was admitted with diastolic CHF and atrial fibrillation.  She was noted to have a history of treated hyperlipidemia and hypertension.  She had a pulmonary embolus and was placed on Xarelto.  She also had a history of tobacco abuse and continues to smoke.  She had a chest CT 9/19 that showed left main/three-vessel coronary calcification.  She did complain of atypical chest pain.  She underwent a sleep study and was placed on CPAP.  She had a coronary CTA/FFR that showed a physiologically significant lesion in the small nondominant RCA.  She was noted to have gained 20 pounds since she was last seen.  She complained of increased shortness of breath, dyspnea on exertion and orthopnea.  An echocardiogram 4/18 showed normal LV systolic function.  She was last seen by Dr. Gwenlyn Found on 10/24/2019.  During that time she reported she continued to do well.  She did however continue to gain weight which she attributed to overeating.  She was compliant with her CPAP.  She was noted to have chronic shortness of breath but denied chest pain.  She was mostly homebound at the time of the visit, she reported compliance with her anticoagulation and she continues to smoke.  Admitted to the hospital with COPD exacerbation 04/10/20-04/15/20. Treated with abx and steroid taper.  She is seen virtually today  in follow-up and states she is breathing better since her hospitalization. She completed PT at SNF. She reports she remains sedentary at home.  She sleeps in medical bed with head elevated due her sinus congestion.  She reports compliance with her torsemide medication.  She reports that she does not add any extra sodium to her  foods and that her sister is helping with her meals.  She is in the process of trying to get an oxygen concentrator for home use.  She reports that she has a pulmonology appointment tomorrow.  She has had both COVID-19 vaccines and her booster.  I will give her the salty 6 diet sheet, continue her current medications, have her increase her physical activity as tolerated (encouraged at least 15 minutes of physical activity daily, and we'll have her follow-up with Dr. Gwenlyn Found in 6 months.  Today she denies chest pain, shortness of breath, lower extremity edema, fatigue, palpitations, melena, hematuria, hemoptysis, diaphoresis, weakness, presyncope, syncope, orthopnea, and PND.    The patient does not symptoms concerning for COVID-19 infection (fever, chills, cough, or new SHORTNESS OF BREATH).    Prior CV studies:   The following studies were reviewed today:  Echocardiogram 09/30/2019  IMPRESSIONS    1. Left ventricular ejection fraction, by estimation, is 65 to 70%. The  left ventricle has hyperdynamic function. The left ventricle has no  regional wall motion abnormalities. The left ventricular internal cavity  size was mildly dilated. There is mild  concentric left ventricular hypertrophy. Left ventricular diastolic  parameters are consistent with Grade II diastolic dysfunction  (pseudonormalization). Elevated left atrial pressure.  2. Right ventricular systolic function is hyperdynamic. The right  ventricular size is normal.  3. Left atrial size was moderately dilated.  4. The mitral valve is normal in structure. Trivial mitral valve  regurgitation.  5. The aortic valve is normal in structure. Aortic valve regurgitation is  not visualized. Mild aortic valve sclerosis is present, with no evidence  of aortic valve stenosis.  6. The inferior vena cava is dilated in size with >50% respiratory  variability, suggesting right atrial pressure of 8 mmHg.   Comparison(s): Prior images  reviewed side by side. The left ventricular  function is unchanged. The left ventricular diastolic function is  significantly worse. Findings are consistent ith high output diastolic  heart failure, such as obesity cardiomyopathy  (should exclude anemia, thyrotoxicosis, AV fistula, etc.).  Past Medical History:  Diagnosis Date  . A-fib ([place])   . Anxiety   . Arthritis    "qwhre; joints, back" (04/17/2017)  . Benign breast cyst in female, left 01/08/2017   Found by Screening mammogram, evaluated by U/S on 01/08/17 and determined to be a benign simple breast cyst.  . Cellulitis of left lower leg 05/30/2017  . CHF (congestive heart failure) ([place])   . Chronic low back pain 08/21/2016  . Chronic lower back pain   . Chronic venous insufficiency    [name] 05/30/2017  . COPD (chronic obstructive pulmonary disease) ([place])   . Depression   . DVT (deep venous thrombosis) ([place]) 11/16/2016  . GERD (gastroesophageal reflux disease)   . Headache    "weekly for the last 3 months" (04/17/2017)  . Hyperlipidemia   . Hypertension   . Morbid obesity ([place])   . PE (pulmonary embolism)   . Pulmonary embolism ([place]) 09/21/2014  . Sleep apnea    Past Surgical History:  Procedure Laterality Date  . ABDOMINAL HYSTERECTOMY    . APPENDECTOMY    .  BALLOON DILATION N/A 03/09/2020   Procedure: BALLOON DILATION;  Surgeon: Otis Brace, MD;  Location: [facility];  Service: Gastroenterology;  Laterality: N/A;  . BIOPSY  03/09/2020   Procedure: BIOPSY;  Surgeon: Otis Brace, MD;  Location: [facility];  Service: Gastroenterology;;  . BREAST CYST EXCISION Left    "six o'clock"  . BREAST LUMPECTOMY Left   . CHOLECYSTECTOMY    . DILATION AND CURETTAGE OF UTERUS    . ESOPHAGOGASTRODUODENOSCOPY (EGD) WITH PROPOFOL N/A 03/09/2020   Procedure: ESOPHAGOGASTRODUODENOSCOPY (EGD) WITH PROPOFOL;  Surgeon: Otis Brace, MD;  Location: [facility];  Service: Gastroenterology;  Laterality: N/A;  . IR ABLATE  LIVER CRYOABLATION  07/23/2019  . IR RADIOLOGIST EVAL & MGMT  07/18/2019  . TONSILLECTOMY AND ADENOIDECTOMY    . TUBAL LIGATION       Current Meds  Medication Sig  . albuterol (PROVENTIL) (2.5 MG/3ML) 0.083% nebulizer solution TAKE 3 MLS(1 AMPULE) BY NEBULIZATION EVERY 4 HOURS AS NEEDED FOR WHHEZING OR SHORTNESS OF BREATH (Patient taking differently: Take 2.5 mg by nebulization every 4 (four) hours as needed for wheezing or shortness of breath.)  . aspirin EC 81 MG tablet Take 81 mg by mouth daily.  [name] atorvastatin (LIPITOR) 40 MG tablet Take 1 tablet (40 mg total) by mouth daily.  . benazepril (LOTENSIN) 40 MG tablet Take 0.5 tablets (20 mg total) by mouth daily.  [name] esomeprazole (NEXIUM) 40 MG capsule Take 1 capsule (40 mg total) by mouth daily as needed (for heartburn or indigestion). (Patient taking differently: Take 40 mg by mouth daily.)  . metoprolol succinate (TOPROL-XL) 50 MG 24 hr tablet TAKE 1 TABLET(50 MG) BY MOUTH DAILY WITH OR IMMEDIATELY FOLLOWING A MEAL  . mirabegron ER (MYRBETRIQ) 50 MG TB24 tablet Take 50 mg by mouth daily.  [name] oxyCODONE-acetaminophen (PERCOCET/ROXICET) 5-325 MG tablet Take 1 tablet by mouth every 4 (four) hours as needed for up to 8 days for severe pain.  . potassium chloride (KLOR-CON) 10 MEQ tablet TAKE 2 TABLETS(20 MEQ) BY MOUTH TWICE DAILY (Patient taking differently: Take 10-20 mEq by mouth daily.)  . rivaroxaban (XARELTO) 20 MG TABS tablet Take 1 tablet (20 mg total) by mouth every morning.  . tiotropium (SPIRIVA HANDIHALER) 18 MCG inhalation capsule PLACE 1 CAPSULE INTO INHALER AND INHALE DAILY (Patient taking differently: Place 18 mcg into inhaler and inhale daily.)  . torsemide (DEMADEX) 20 MG tablet TAKE 2 TABLETS BY MOUTH DAILY (Patient taking differently: Take 20 mg by mouth 2 (two) times daily.)     Allergies:   Patient has no known allergies.   Social History   Tobacco Use  . Smoking status: Current Some Day Smoker    Packs/day: 0.10    Years:  55.00    Pack years: 5.50    Types: Cigarettes    Start date: 08/16/1961  . Smokeless tobacco: Never Used  . Tobacco comment: 3 cigs per day  Vaping Use  . Vaping Use: Never used  Substance Use Topics  . Alcohol use: No  . Drug use: No     Family Hx: The patient's family history includes Breast cancer in her mother; Hyperlipidemia in her maternal grandfather and mother; Hypertension in her maternal grandfather and mother.  ROS:   Please see the history of present illness.     All other systems reviewed and are negative.   Labs/Other Tests and Data Reviewed:    Recent Labs: 04/10/2020: ALT 28; B Natriuretic Peptide 672.7 04/15/2020: BUN 21; Creatinine, Ser 0.99; Hemoglobin 11.3;  Magnesium 1.8; Platelets 405; Potassium 4.0; Sodium 142   Recent Lipid Panel Lab Results  Component Value Date/Time   CHOL 154 07/19/2017 10:17 AM   TRIG 99 07/19/2017 10:17 AM   HDL 48 07/19/2017 10:17 AM   CHOLHDL 3.2 07/19/2017 10:17 AM   LDLCALC 86 07/19/2017 10:17 AM    Wt Readings from Last 3 Encounters:  05/12/20 (!) 384 lb (174.2 kg)  04/15/20 (!) 368 lb 6.2 oz (167.1 kg)  03/09/20 (!) 349 lb (158.3 kg)     Exam:    Vital Signs:  BP (!) 154/116   Pulse 91   Wt (!) 384 lb (174.2 kg)   BMI 49.30 kg/m    Well nourished, well developed female in no  acute distress.   ASSESSMENT & PLAN:    1.  Essential hypertension-BP today 154/116 had not yet taken her blood pressure medication.  Well-controlled at home.  130s over 80s. Continue Benzapril, metoprolol, torsemide Heart healthy low-sodium diet-salty 6 given Increase physical activity as tolerated  Morbid obesity-weight today 384 pounds. Continue weight loss Heart healthy low-sodium diet-salty 6 given Increase physical activity as tolerated  Heart failure with preserved ejection fraction-no increased DOE or activity intolerance.  Echocardiogram showed an EF of 65-70%, G2 DD Continue torsemide, potassium, metoprolol,  Benzapril Heart healthy low-sodium diet-salty 6 given Increase physical activity as tolerated  Paroxysmal atrial fibrillation-heart rate today 91.  Cardiac unaware. Continue Xarelto Heart healthy low-sodium diet-salty 6 given Increase physical activity as tolerated Avoid triggers caffeine, chocolate, EtOH, dehydration etc.  Coronary artery calcification-no chest pain today.  Identified on coronary CTA and previous CT scan Continue Xarelto, aspirin, metoprolol Heart healthy low-sodium diet-salty 6 given Increase physical activity as tolerated  COPD-continues to smoke.  Smoking cessation encouraged Follows with pulmonology  COVID-19 Education: The signs and symptoms of COVID-19 were discussed with the patient and how to seek care for testing (follow up with PCP or arrange E-visit).  The importance of social distancing was discussed today.  Patient Risk:   After full review of this patients clinical status, I feel that they are at least moderate risk at this time.  Time:   Today, I have spent 16 minutes with the patient with telehealth technology discussing diet, medication, exercise, COPD.  I spent greater than 20 minutes reviewing her past medical history, cardiac test, and medications.   Medication Adjustments/Labs and Tests Ordered: Current medicines are reviewed at length with the patient today.  Concerns regarding medicines are outlined above.   Tests Ordered: No orders of the defined types were placed in this encounter.  Medication Changes: No orders of the defined types were placed in this encounter.   Disposition:  in 6 month(s)  Signed, Jossie Ng. Clebert Wenger NP-C    11/05/2018 11:58 AM    [address] Office [phone] Fax [phone]

## 2020-05-11 NOTE — Progress Notes (Signed)
Internal Medicine Clinic Resident  I have personally reviewed this encounter including the documentation in this note and/or discussed this patient with the care management provider. I will address any urgent items identified by the care management provider and will communicate my actions to the patient's PCP. I have reviewed the patient's CCM visit with my supervising attending, Dr Evette Doffing.  Foy Gu[place], MD 05/11/2020

## 2020-05-12 ENCOUNTER — Encounter: Payer: Self-pay | Admitting: General Practice

## 2020-05-12 ENCOUNTER — Telehealth (INDEPENDENT_AMBULATORY_CARE_PROVIDER_SITE_OTHER): Payer: Medicare Other | Admitting: General Practice

## 2020-05-12 VITALS — BP 154/116 | HR 91 | Wt 384.0 lb

## 2020-05-12 DIAGNOSIS — Z7189 Other specified counseling: Secondary | ICD-10-CM

## 2020-05-12 DIAGNOSIS — I480 Paroxysmal atrial fibrillation: Secondary | ICD-10-CM | POA: Diagnosis not present

## 2020-05-12 DIAGNOSIS — I10 Essential (primary) hypertension: Secondary | ICD-10-CM | POA: Diagnosis not present

## 2020-05-12 DIAGNOSIS — I5032 Chronic diastolic (congestive) heart failure: Secondary | ICD-10-CM | POA: Diagnosis not present

## 2020-05-12 DIAGNOSIS — I2510 Atherosclerotic heart disease of native coronary artery without angina pectoris: Secondary | ICD-10-CM | POA: Diagnosis not present

## 2020-05-12 NOTE — Patient Instructions (Signed)
Medication Instructions:  The current medical regimen is effective;  continue present plan and medications as directed. Please refer to the Current Medication list given to you today. *If you need a refill on your cardiac medications before your next appointment, please call your pharmacy*  Lab Work:   Testing/Procedures:  NONE    NONE  Special Instructions PLEASE READ AND FOLLOW SALTY 6-ATTACHED-1,800mg  daily  PLEASE INCREASE PHYSICAL ACTIVITY AS TOLERATED-PLEASE TRY TO DO AT LEAST 15 MINUTES DAILY  Follow-Up: Your next appointment:  6 month(s) In Person with Quay Burow, MD OR IF UNAVAILABLE JESSE CLEAVER, FNP-C   Please call our office 2 months in advance to schedule this appointment   At [facility], you and your health needs are our priority.  As part of our continuing mission to provide you with exceptional heart care, we have created designated Provider Care Teams.  These Care Teams include your primary Cardiologist (physician) and Advanced Practice Providers (APPs -  Physician Assistants and Nurse Practitioners) who all work together to provide you with the care you need, when you need it.            6 SALTY THINGS TO AVOID     1,800MG  DAILY

## 2020-05-13 ENCOUNTER — Encounter: Payer: Self-pay | Admitting: Primary Care

## 2020-05-13 ENCOUNTER — Ambulatory Visit (INDEPENDENT_AMBULATORY_CARE_PROVIDER_SITE_OTHER): Payer: Medicare Other | Admitting: Primary Care

## 2020-05-13 ENCOUNTER — Other Ambulatory Visit: Payer: Self-pay

## 2020-05-13 VITALS — BP 118/78 | HR 68 | Temp 98.2°F | Ht 74.0 in | Wt 384.0 lb

## 2020-05-13 DIAGNOSIS — I5032 Chronic diastolic (congestive) heart failure: Secondary | ICD-10-CM

## 2020-05-13 DIAGNOSIS — J9611 Chronic respiratory failure with hypoxia: Secondary | ICD-10-CM | POA: Diagnosis not present

## 2020-05-13 DIAGNOSIS — J849 Interstitial pulmonary disease, unspecified: Secondary | ICD-10-CM | POA: Diagnosis not present

## 2020-05-13 DIAGNOSIS — J441 Chronic obstructive pulmonary disease with (acute) exacerbation: Secondary | ICD-10-CM

## 2020-05-13 DIAGNOSIS — G4733 Obstructive sleep apnea (adult) (pediatric): Secondary | ICD-10-CM | POA: Diagnosis not present

## 2020-05-13 DIAGNOSIS — R0602 Shortness of breath: Secondary | ICD-10-CM | POA: Diagnosis not present

## 2020-05-13 MED ORDER — PREDNISONE 10 MG PO TABS: ORAL_TABLET | ORAL | 0 refills | Status: DC

## 2020-05-13 NOTE — Assessment & Plan Note (Signed)
-   Patient is 83% compliant with CPAP use. Pressure 13cm h20, residual AHI 10.8. She is experiencing large amount of air leaks. Advised her to change mask if old. Recommend changing CPAP to auto titrate 10-20cm h20 if able for better control

## 2020-05-13 NOTE — Assessment & Plan Note (Addendum)
-   Off amiodarone, still occasionally smoking  - Repeat HRCT to assess for progression of RB/ILD

## 2020-05-13 NOTE — Patient Instructions (Signed)
Orders: Continuous oxygen 2L/min (ordered 04/15/20 from hospital admission) HRCT (ordered) Change CPAP to auto titrate 10-20cm h20   Rx: Prednisone taper as directed   Follow-up: 2-3 months with Dr. Elsworth Soho

## 2020-05-13 NOTE — Assessment & Plan Note (Addendum)
-   EF 65-70%, grade 2 DD - Continue Torsemide 20mg  BID - Following with cardiology

## 2020-05-13 NOTE — Assessment & Plan Note (Signed)
>>  ASSESSMENT AND PLAN FOR DIASTOLIC HEART FAILURE (HCC) WRITTEN ON 05/13/2020  2:27 PM BY WALSH, ELIZABETH W, NP  - EF 65-70%, grade 2 DD - Continue Torsemide 20mg  BID - Following with cardiology

## 2020-05-13 NOTE — Progress Notes (Signed)
   CC: hospital follow up for COPD, HTN, HFpEF  HPI:  Sharon Vasquez is a 70 y.o. female with history as below presenting for hospital follow up on the above. Please refer to problem based charting for further details of assessment and plan of current problem and chronic medical conditions.   Past Medical History:  Diagnosis Date  . A-fib ([place])   . Anxiety   . Arthritis    "qwhre; joints, back" (04/17/2017)  . Benign breast cyst in female, left 01/08/2017   Found by Screening mammogram, evaluated by U/S on 01/08/17 and determined to be a benign simple breast cyst.  . Cellulitis of left lower leg 05/30/2017  . CHF (congestive heart failure) ([place])   . Chronic low back pain 08/21/2016  . Chronic lower back pain   . Chronic venous insufficiency    [name] 05/30/2017  . COPD (chronic obstructive pulmonary disease) ([place])   . Depression   . DVT (deep venous thrombosis) ([place]) 11/16/2016  . GERD (gastroesophageal reflux disease)   . Headache    "weekly for the last 3 months" (04/17/2017)  . Hyperlipidemia   . Hypertension   . Morbid obesity ([place])   . PE (pulmonary embolism)   . Pulmonary embolism ([place]) 09/21/2014  . Sleep apnea    Review of Systems:   Review of Systems  Constitutional: Negative for chills and fever.  Respiratory: Positive for wheezing. Negative for cough and shortness of breath.   Cardiovascular: Negative for chest pain and palpitations.  Musculoskeletal: Positive for joint pain. Negative for falls.  Neurological: Positive for dizziness. Negative for loss of consciousness.  All other systems reviewed and are negative.    Physical Exam: Vitals:   05/14/20 0910 05/14/20 1002  BP: (!) 89/60 99/66  Pulse: 85 82  Temp: 98.2 F (36.8 C)   TempSrc: Oral   SpO2: 95%    Constitutional: no acute distress Head: atraumatic ENT: external ears normal, R eye injected Cardiovascular: regular rate and rhythm, normal heart sounds, nonpitting edema with chronic venous stasis  changes Pulmonary: effort normal, normal breath sounds bilaterally Abdominal: flat, nontender, no rebound tenderness, bowel sounds normal Musculoskeletal: chronic venous stasis changes to bilateral lower legs Skin: warm and dry Neurological: alert, no focal deficit Psychiatric: normal mood and affect  Assessment & Plan:   See Encounters Tab for problem based charting.  Patient discussed with Dr. Heber Fluvanna

## 2020-05-13 NOTE — Assessment & Plan Note (Addendum)
-   Hospitalized for 5 days in January 2022 for COPD exacerbation. Treated with 5 days azithromycin and steriods. Continues to exeperience dyspnea with minor exertion. Faint expiratory wheezing on exam. Continue Advair 250/50 1 puff twice daily and Spiriva handihaler 58mcg daily. Rx prednisone taper as directed.

## 2020-05-13 NOTE — Progress Notes (Signed)
@Patient  ID: Sharon Vasquez, female    DOB: 1950-07-11, 71 y.o.   [name]: [id number]  Chief Complaint  Patient presents with  . Follow-up    SOB/ wheezing/ cough      Referring provider: Andrew Au, MD  HPI: 71 year old female, current some day smoker.  Past medical history significant for COPD, RB-ILD, chronic respiratory failure with hypoxia, OSA, hypertension, A. fib, chronic anticoagulation, diastolic heart failure, aortic arthrosclerosis GERD, osteoporosis, chronic pain syndrome, obesity. [name], last seen in office on 12/25/2019.  Maintained on CPAP.  Amiodarone discontinued.  Smoking cessation encouraged.  Hospital course 04/10/2020-04/15/2020 Patient was admitted for acute hypoxic respiratory failure due to COPD exacerbation and respiratory bronchiolitis associated ILD.  Oxygen saturation was 70% upon EMS arrival.  She was started on BiPAP in ED.  She had severe diffuse wheezing.  She was treated for acute COPD exacerbation with 5 days of azithromycin and steroids.  She was transitioned to nasal cannula 12 L which was gradually weaned down.  Discharged on home oxygen.  05/13/2020-interim history Patient presents today for hospital follow-up.  Patient was admitted from 04/10/2020-04/15/2020 at [facility] for COPD exacerbation. She still gets out of breath with minimal exertion. Associated wheezing. She is complaint with Advair 250/50 and Spiriva handihaler. She has not been set up with home oxygen, needs concentrator.  Using CPAP fairly regularly. Has full face mask. States that she has not smoked in 1 week but still taking "puffs" of cigarettes. She had virtual visit with cardiology yesterday, no changes. She is off amiodarone. She is taking Torsemide 20mg  twice a day. Current weight 384lbs   Airview download 04/12/20-05/11/20 25/30 days (83%); 21 days (70%) > 4 hours Average usage days used 8 hours 31 mins Pressure 13 cm h20 Airleaks 11.0 median (102.4L/min) AHI 10.8    No  Known Allergies  Immunization History  Administered Date(s) Administered  . Moderna Sars-Covid-2 Vaccination 08/20/2019, 09/11/2019  . PFIZER(Purple Top)SARS-COV-2 Vaccination 03/17/2020  . Pneumococcal Conjugate-13 02/01/2017  . Pneumococcal Polysaccharide-23 02/12/2020  . Tdap 06/05/2019    Past Medical History:  Diagnosis Date  . A-fib ([place])   . Anxiety   . Arthritis    "qwhre; joints, back" (04/17/2017)  . Benign breast cyst in female, left 01/08/2017   Found by Screening mammogram, evaluated by U/S on 01/08/17 and determined to be a benign simple breast cyst.  . Cellulitis of left lower leg 05/30/2017  . CHF (congestive heart failure) ([place])   . Chronic low back pain 08/21/2016  . Chronic lower back pain   . Chronic venous insufficiency    [name] 05/30/2017  . COPD (chronic obstructive pulmonary disease) ([place])   . Depression   . DVT (deep venous thrombosis) ([place]) 11/16/2016  . GERD (gastroesophageal reflux disease)   . Headache    "weekly for the last 3 months" (04/17/2017)  . Hyperlipidemia   . Hypertension   . Morbid obesity ([place])   . PE (pulmonary embolism)   . Pulmonary embolism ([place]) 09/21/2014  . Sleep apnea     Tobacco History: Social History   Tobacco Use  Smoking Status Former Smoker  . Packs/day: 0.10  . Years: 55.00  . Pack years: 5.50  . Types: Cigarettes  . Start date: 08/16/1961  . Quit date: 05/03/2020  . Years since quitting: 0.0  Smokeless Tobacco Never Used  Tobacco Comment   pt states quiting 2 weeks ago ARJ 05/13/20   Counseling given: Not Answered Comment: pt states quiting  2 weeks ago ARJ 05/13/20   Outpatient Medications Prior to Visit  Medication Sig Dispense Refill  . albuterol (PROVENTIL) (2.5 MG/3ML) 0.083% nebulizer solution TAKE 3 MLS(1 AMPULE) BY NEBULIZATION EVERY 4 HOURS AS NEEDED FOR WHHEZING OR SHORTNESS OF BREATH (Patient taking differently: Take 2.5 mg by nebulization every 4 (four) hours as needed for wheezing or shortness of  breath.) 75 mL 2  . aspirin EC 81 MG tablet Take 81 mg by mouth daily.    [name] atorvastatin (LIPITOR) 40 MG tablet Take 1 tablet (40 mg total) by mouth daily. 90 tablet 3  . benazepril (LOTENSIN) 40 MG tablet Take 0.5 tablets (20 mg total) by mouth daily. 30 tablet 0  . esomeprazole (NEXIUM) 40 MG capsule Take 1 capsule (40 mg total) by mouth daily as needed (for heartburn or indigestion). (Patient taking differently: Take 40 mg by mouth daily.) 90 capsule 1  . Fluticasone-Salmeterol (ADVAIR) 250-50 MCG/DOSE AEPB Inhale 1 puff into the lungs 2 (two) times daily.    [name] gabapentin (NEURONTIN) 400 MG capsule TAKE 1 CAPSULE BY MOUTH  TWICE DAILY (Patient taking differently: Take 400 mg by mouth 2 (two) times daily.) 180 capsule 3  . ibuprofen (ADVIL) 200 MG tablet Take 800 mg by mouth every 6 (six) hours as needed (inflammation).    . metoprolol succinate (TOPROL-XL) 50 MG 24 hr tablet TAKE 1 TABLET(50 MG) BY MOUTH DAILY WITH OR IMMEDIATELY FOLLOWING A MEAL 90 tablet 0  . mirabegron ER (MYRBETRIQ) 50 MG TB24 tablet Take 50 mg by mouth daily.    . Multiple Vitamin (MULTIVITAMIN WITH MINERALS) TABS tablet Take 1 tablet by mouth daily.    . potassium chloride (KLOR-CON) 10 MEQ tablet TAKE 2 TABLETS(20 MEQ) BY MOUTH TWICE DAILY (Patient taking differently: Take 10-20 mEq by mouth daily.) 360 tablet 1  . PRESCRIPTION MEDICATION Inhale into the lungs at bedtime. cpap    . rivaroxaban (XARELTO) 20 MG TABS tablet Take 1 tablet (20 mg total) by mouth every morning. 90 tablet 3  . tiotropium (SPIRIVA HANDIHALER) 18 MCG inhalation capsule PLACE 1 CAPSULE INTO INHALER AND INHALE DAILY (Patient taking differently: Place 18 mcg into inhaler and inhale daily.) 90 capsule 1  . torsemide (DEMADEX) 20 MG tablet TAKE 2 TABLETS BY MOUTH DAILY (Patient taking differently: Take 20 mg by mouth 2 (two) times daily.) 180 tablet 1  . guaiFENesin (ROBITUSSIN) 100 MG/5ML SOLN Take 10 mLs (200 mg total) by mouth 2 (two) times daily.  (Patient not taking: Reported on 05/13/2020) 236 mL 0   No facility-administered medications prior to visit.   Review of Systems  Review of Systems  Respiratory: Positive for shortness of breath and wheezing.   Cardiovascular: Positive for leg swelling.   Physical Exam  BP 118/78 (BP Location: Right Arm, Cuff Size: Large)   Pulse 68   Temp 98.2 F (36.8 C) (Temporal)   Ht 6\' 2"  (1.88 m)   Wt (!) 384 lb (174.2 kg)   SpO2 93%   BMI 49.30 kg/m  Physical Exam Constitutional:      Appearance: Normal appearance. She is obese.  HENT:     Head: Normocephalic and atraumatic.     Mouth/Throat:     Comments: Deferred d/t masking Cardiovascular:     Rate and Rhythm: Normal rate.     Comments: +3 BLE edema Pulmonary:     Effort: Pulmonary effort is normal.     Breath sounds: Wheezing present. No rhonchi or rales.     Comments:  Lungs diminished Musculoskeletal:     Comments: In motorized wheelchair   Skin:    General: Skin is warm and dry.  Neurological:     General: No focal deficit present.     Mental Status: She is alert and oriented to person, place, and time. Mental status is at baseline.  Psychiatric:        Mood and Affect: Mood normal.        Behavior: Behavior normal.        Thought Content: Thought content normal.      Lab Results:  CBC    Component Value Date/Time   WBC 9.2 04/15/2020 0254   RBC 3.74 (L) 04/15/2020 0254   HGB 11.3 (L) 04/15/2020 0254   HGB 12.4 04/29/2018 1235   HCT 32.9 (L) 04/15/2020 0254   HCT 37.2 04/29/2018 1235   PLT 405 (H) 04/15/2020 0254   PLT 389 04/29/2018 1235   MCV 88.0 04/15/2020 0254   MCV 86 04/29/2018 1235   MCH 30.2 04/15/2020 0254   MCHC 34.3 04/15/2020 0254   RDW 13.9 04/15/2020 0254   RDW 13.4 04/29/2018 1235   LYMPHSABS 6.0 (H) 04/10/2020 0117   MONOABS 0.5 04/10/2020 0117   EOSABS 0.1 04/10/2020 0117   BASOSABS 0.0 04/10/2020 0117    BMET    Component Value Date/Time   NA 142 04/15/2020 0254   NA 144  10/23/2019 0924   K 4.0 04/15/2020 0254   CL 104 04/15/2020 0254   CO2 28 04/15/2020 0254   GLUCOSE 138 (H) 04/15/2020 0254   BUN 21 04/15/2020 0254   BUN 16 10/23/2019 0924   CREATININE 0.99 04/15/2020 0254   CREATININE 1.21 (H) 07/26/2016 1528   CALCIUM 8.7 (L) 04/15/2020 0254   GFRNONAA >60 04/15/2020 0254   GFRAA 79 10/23/2019 0924    BNP    Component Value Date/Time   BNP 672.7 (H) 04/10/2020 0117    ProBNP No results found for: PROBNP  Imaging: No results found.   Assessment & Plan:   Acute exacerbation of chronic obstructive pulmonary disease (COPD) ([place]) - Hospitalized for 5 days in January 2022 for COPD exacerbation. Treated with 5 days azithromycin and steriods. Continues to exeperience dyspnea with minor exertion. Faint expiratory wheezing on exam. Continue Advair 250/50 1 puff twice daily and Spiriva handihaler 6mcg daily. Rx prednisone taper as directed.  ILD (interstitial lung disease) (H[facility]) - Off amiodarone, still occasionally smoking  - Repeat HRCT to assess for progression of RB/ILD  Chronic respiratory failure with hypoxia ([place]) - Discharged home with oxygen during most recent hospitalization but never received tanks or concentrator. During visit today, O2 dropped to 87% RA on transfer. Sending in NEW order for 2L continuous oxygen   OSA (obstructive sleep apnea) - Patient is 83% compliant with CPAP use. Pressure 13cm h20, residual AHI 10.8. She is experiencing large amount of air leaks. Advised her to change mask if old. Recommend changing CPAP to auto titrate 10-20cm h20 if able for better control  Diastolic heart failure (HCC) - EF 65-70%, grade 2 DD - Continue Torsemide 20mg  BID - Following with cardiology    Martyn Ehrich, NP 05/13/2020

## 2020-05-13 NOTE — Assessment & Plan Note (Signed)
-   Discharged home with oxygen during most recent hospitalization but never received tanks or concentrator. During visit today, O2 dropped to 87% RA on transfer. Sending in NEW order for 2L continuous oxygen

## 2020-05-14 ENCOUNTER — Encounter: Payer: Self-pay | Admitting: Student

## 2020-05-14 ENCOUNTER — Ambulatory Visit (INDEPENDENT_AMBULATORY_CARE_PROVIDER_SITE_OTHER): Payer: Medicare Other | Admitting: Student

## 2020-05-14 ENCOUNTER — Other Ambulatory Visit: Payer: Self-pay

## 2020-05-14 VITALS — BP 99/66 | HR 82 | Temp 98.2°F

## 2020-05-14 DIAGNOSIS — I5033 Acute on chronic diastolic (congestive) heart failure: Secondary | ICD-10-CM | POA: Diagnosis not present

## 2020-05-14 DIAGNOSIS — E782 Mixed hyperlipidemia: Secondary | ICD-10-CM | POA: Diagnosis not present

## 2020-05-14 DIAGNOSIS — I4891 Unspecified atrial fibrillation: Secondary | ICD-10-CM

## 2020-05-14 DIAGNOSIS — G894 Chronic pain syndrome: Secondary | ICD-10-CM

## 2020-05-14 DIAGNOSIS — J9611 Chronic respiratory failure with hypoxia: Secondary | ICD-10-CM

## 2020-05-14 DIAGNOSIS — Z720 Tobacco use: Secondary | ICD-10-CM | POA: Diagnosis not present

## 2020-05-14 DIAGNOSIS — I10 Essential (primary) hypertension: Secondary | ICD-10-CM

## 2020-05-14 DIAGNOSIS — E785 Hyperlipidemia, unspecified: Secondary | ICD-10-CM | POA: Insufficient documentation

## 2020-05-14 MED ORDER — POTASSIUM CHLORIDE CRYS ER 10 MEQ PO TBCR: 10.0000 meq | EXTENDED_RELEASE_TABLET | Freq: Two times a day (BID) | ORAL | 1 refills | Status: DC

## 2020-05-14 MED ORDER — MIRABEGRON ER 50 MG PO TB24: 50.0000 mg | ORAL_TABLET | Freq: Every day | ORAL | 1 refills | Status: DC

## 2020-05-14 NOTE — Assessment & Plan Note (Signed)
On atorvastatin 40mg  and had good response in cholesterol levels, last LDL of 86 in 2019. Tolerating atorvastatin well, no complaints. The 10-year ASCVD risk score Mikey Bussing DC Jr., et al., 2013) is: 11.2%  -continue atorvastatin 40mg  -check lipid panel

## 2020-05-14 NOTE — Assessment & Plan Note (Signed)
Chronic respiratory failure with hypoxia 2/2 COPD, OSA, ILD. Follows with pulmonology, last visit on 05/13/2020.  Recently hospitalized for COPD exacerbation treated with steroids and azithromycin.  Continuing Advair and Spiriva, tapering prednisone. Today with mild wheezing, no respiratory distress, no coughing. Pulmonary also following for ILD, repeating high-resolution CT to assess for progression.  She was supposed to be discharged with oxygen, but skilled nursing facility was unable to obtain concentrator or tanks apparently.  Pulmonology sent new order for this.  Using CPAP machine at home, though air leaks noted per machine reports.  Per pulmonology, recommended changing to a new mask and changing CPAP to auto titrate 10-20cm h20 if able for better control  -Follow-up with pulmonology -patient will call CPAP machine and ask for new mask -continue current inhalers, she endorses compliance with daily use

## 2020-05-14 NOTE — Patient Instructions (Signed)
Thank you for allowing [place] to be a part of your care today, it was a pleasure seeing you. We discussed your blood pressure, COPD, sleep apnea, tobacco use, chronic pain, and cholesterol  I am checking these labs: lipid panel  I have made these changes to your medications: DECREASE benazpril to 20mg  (1/2 tablet) DECREASE Toprol to 50mg   I am ordering a home health aid and PT.  Please follow up in 6 months or sooner if needed   Thank you, and please call the Internal Medicine Clinic at [phone] if you have any questions.  Best, Dr. [name]

## 2020-05-14 NOTE — Assessment & Plan Note (Signed)
Legs with chronic venous stasis changes but no swelling above baseline, no crackles on lung exam, no JVD. No signs of worsening HFpEF. Reports compliance with medications, including torsemide. This has been easier to do since obtaining purewick.  -continue torsemide 40mg  BID -continue Klor-con 36mEq BID -decrease benazeptil to 20mg  for low blood pressure -decrease toprol to 50mg  for low blood pressure -follow up wit cardiology

## 2020-05-14 NOTE — Assessment & Plan Note (Signed)
At recent admission presented with A. fib with RVR, initially required diltiazem drip, then transition to Toprol 50 mg.  Home dose was 100 mg previously.  Currently has heart rate of 85 and in A fib.  -continue Xarelto -decrease Toprol to 50mg  as noted elsewhere for hypotension -avoid further amiodarone use to to ILD

## 2020-05-14 NOTE — Assessment & Plan Note (Signed)
>>  ASSESSMENT AND PLAN FOR DIASTOLIC HEART FAILURE (HCC) WRITTEN ON 05/14/2020 11:47 AM BY Remo Lipps, MD  Legs with chronic venous stasis changes but no swelling above baseline, no crackles on lung exam, no JVD. No signs of worsening HFpEF. Reports compliance with medications, including torsemide. This has been easier to do since obtaining purewick.  -continue torsemide 40mg  BID -continue Klor-con BID -decrease benazeptil to 20mg  for low blood pressure -decrease toprol to 50mg  for low blood pressure -follow up wit cardiology

## 2020-05-14 NOTE — Assessment & Plan Note (Signed)
Notes recent hospitalization and subsequent SNF stay during which she did not smoke, total of >20 days. She has had less urge to smoke since then, last cigarette was 3 days ago. Believes that she can quit.  -patient will try to quit smoking -counseled to not use cigarettes with oxygen

## 2020-05-14 NOTE — Assessment & Plan Note (Signed)
BP: 99/66 today. HTN meds of benazepril 40mg  and Toprol 100mg  which were reduced to 20mg  and 50mg  upon recent hospital discharge, but this was unclear to her and she was taking original doses. Is feeling somewhat faint today which improved with rest.    -reduce benazepril to 20mg  and Toprol to 50mg 

## 2020-05-14 NOTE — Assessment & Plan Note (Signed)
Has had long time prescription for Percocet 5-325 every 4 hours as needed for pain in bilateral knees due to arthritis.  She states this is very helpful with her functional status.  Had missed her pain clinic appointment due to hospitalization recently, has since returned to their care. States they changed her dose and frequency, to 15mg  q6hr.  -pain medications per pain clinic

## 2020-05-15 LAB — LIPID PANEL
Chol/HDL Ratio: 5.7 ratio — ABNORMAL HIGH (ref 0.0–4.4)
Cholesterol, Total: 142 mg/dL (ref 100–199)
HDL: 25 mg/dL — ABNORMAL LOW (ref >39)
LDL Chol Calc (NIH): 81 mg/dL (ref 0–99)
Triglycerides: 214 mg/dL — ABNORMAL HIGH (ref 0–149)
VLDL Cholesterol Cal: 36 mg/dL (ref 5–40)

## 2020-05-17 ENCOUNTER — Ambulatory Visit: Payer: Medicare Other | Admitting: *Deleted

## 2020-05-17 DIAGNOSIS — J9611 Chronic respiratory failure with hypoxia: Secondary | ICD-10-CM

## 2020-05-17 DIAGNOSIS — I5033 Acute on chronic diastolic (congestive) heart failure: Secondary | ICD-10-CM

## 2020-05-17 DIAGNOSIS — E782 Mixed hyperlipidemia: Secondary | ICD-10-CM

## 2020-05-17 NOTE — Chronic Care Management (AMB) (Addendum)
   05/17/2020  Sharon Vasquez [birth year] [id number]  Returned call to patient after she left message earlier today. She is requesting this CCM RN call her Medicaid case worker to determine what she must do to qualify for full Medicaid as she currently has Medicaid MQB with many outstanding medical bills.  Left message with patient's Medicaid case worker, [name], at the after hours [place] division,  at [phone], option 1,  requesting return call.  Kelli Churn RN, CCM, [facility] RN Care Manager [phone]

## 2020-05-18 ENCOUNTER — Ambulatory Visit: Payer: Medicare Other | Admitting: *Deleted

## 2020-05-18 DIAGNOSIS — I10 Essential (primary) hypertension: Secondary | ICD-10-CM

## 2020-05-18 DIAGNOSIS — I5033 Acute on chronic diastolic (congestive) heart failure: Secondary | ICD-10-CM

## 2020-05-18 DIAGNOSIS — I4891 Unspecified atrial fibrillation: Secondary | ICD-10-CM

## 2020-05-18 DIAGNOSIS — J9611 Chronic respiratory failure with hypoxia: Secondary | ICD-10-CM

## 2020-05-18 NOTE — Chronic Care Management (AMB) (Signed)
   05/18/2020  ARLENE BRICKEL [birth year] [id number]  Called patient to ask if she received a call from her Medicaid case worker, [name], as a result of message left by this CCM RN yesterday requesting case worker call this CCM RN or patient to discuss how patient can apply for Medicaid based on unpaid medical bills. Patient states she has not received a call from Ms Drake Leach. Patient advised this CCM RM will attempt another outreach to Ms Drake Leach on 05/21/20.  Kelli Churn RN, CCM, [facility] RN Care Manager [phone]

## 2020-05-19 ENCOUNTER — Other Ambulatory Visit: Payer: Self-pay

## 2020-05-19 ENCOUNTER — Ambulatory Visit (INDEPENDENT_AMBULATORY_CARE_PROVIDER_SITE_OTHER)
Admission: RE | Admit: 2020-05-19 | Discharge: 2020-05-19 | Disposition: A | Discharge status: Home / Self Care | Payer: Medicare Other | Source: Ambulatory Visit | Attending: Primary Care | Admitting: Primary Care

## 2020-05-19 DIAGNOSIS — J849 Interstitial pulmonary disease, unspecified: Secondary | ICD-10-CM

## 2020-05-19 DIAGNOSIS — I270 Primary pulmonary hypertension: Secondary | ICD-10-CM | POA: Diagnosis not present

## 2020-05-19 DIAGNOSIS — I2510 Atherosclerotic heart disease of native coronary artery without angina pectoris: Secondary | ICD-10-CM | POA: Diagnosis not present

## 2020-05-19 DIAGNOSIS — I712 Thoracic aortic aneurysm, without rupture: Secondary | ICD-10-CM | POA: Diagnosis not present

## 2020-05-19 DIAGNOSIS — Z8709 Personal history of other diseases of the respiratory system: Secondary | ICD-10-CM | POA: Diagnosis not present

## 2020-05-19 NOTE — Progress Notes (Signed)
Internal Medicine Clinic Attending  CCM services provided by the care management provider and their documentation were discussed with Dr. Johnney Ou. We reviewed the pertinent findings, urgent action items addressed by the resident and non-urgent items to be addressed by the PCP.  I agree with the assessment, diagnosis, and plan of care documented in the CCM and resident's note.  Gilles Chiquito, MD 05/19/2020

## 2020-05-19 NOTE — Progress Notes (Signed)
Internal Medicine Clinic Resident  I have personally reviewed this encounter including the documentation in this note and/or discussed this patient with the care management provider. I will address any urgent items identified by the care management provider and will communicate my actions to the patient's PCP. I have reviewed the patient's CCM visit with my supervising attending, Dr Daryll Drown.  Sanjuana Letters, MD 05/19/2020

## 2020-05-20 ENCOUNTER — Telehealth: Payer: Self-pay | Admitting: *Deleted

## 2020-05-20 DIAGNOSIS — J449 Chronic obstructive pulmonary disease, unspecified: Secondary | ICD-10-CM | POA: Diagnosis not present

## 2020-05-20 NOTE — Telephone Encounter (Signed)
Patient called in requesting portable oxygen concentrator for when she goes to store etc. States she looked into [employer] but they wanted $2000 up front. Will route to CCM L. Corvette Orser, BSN, RN-BC

## 2020-05-20 NOTE — Progress Notes (Signed)
Internal Medicine Clinic Attending  Case discussed with Dr. Chen  At the time of the visit.  We reviewed the resident's history and exam and pertinent patient test results.  I agree with the assessment, diagnosis, and plan of care documented in the resident's note. 

## 2020-05-21 ENCOUNTER — Ambulatory Visit: Payer: Medicare Other | Admitting: *Deleted

## 2020-05-21 DIAGNOSIS — J9611 Chronic respiratory failure with hypoxia: Secondary | ICD-10-CM

## 2020-05-21 DIAGNOSIS — I5033 Acute on chronic diastolic (congestive) heart failure: Secondary | ICD-10-CM

## 2020-05-21 DIAGNOSIS — G894 Chronic pain syndrome: Secondary | ICD-10-CM

## 2020-05-21 DIAGNOSIS — E782 Mixed hyperlipidemia: Secondary | ICD-10-CM

## 2020-05-21 DIAGNOSIS — I4891 Unspecified atrial fibrillation: Secondary | ICD-10-CM

## 2020-05-21 DIAGNOSIS — I10 Essential (primary) hypertension: Secondary | ICD-10-CM

## 2020-05-21 NOTE — Chronic Care Management (AMB) (Signed)
Chronic Care Management   CCM RN Visit Note  05/21/2020 Name: Sharon Vasquez [name]: [id number] DOB: 05-Feb-1951  Subjective: Sharon Vasquez is a 70 y.o. year old female who is a primary care patient of [name], [name], MD. The care management team was consulted for assistance with disease management and care coordination needs.     Consent to Services:  The patient was given information about Chronic Care Management services, agreed to services, and gave verbal consent prior to initiation of services.  Please see initial visit note for detailed documentation.   Patient agreed to services and verbal consent obtained.   Assessment: Review of patient past medical history, allergies, medications, health status, including review of consultants reports, laboratory and other test data, was performed as part of comprehensive evaluation and provision of chronic care management services.   SDOH (Social Determinants of Health) assessments and interventions performed:    CCM Care Plan  No Known Allergies  Outpatient Encounter Medications as of 05/21/2020  Medication Sig  . albuterol (PROVENTIL) (2.5 MG/3ML) 0.083% nebulizer solution TAKE 3 MLS(1 AMPULE) BY NEBULIZATION EVERY 4 HOURS AS NEEDED FOR WHHEZING OR SHORTNESS OF BREATH (Patient taking differently: Take 2.5 mg by nebulization every 4 (four) hours as needed for wheezing or shortness of breath.)  . aspirin EC 81 MG tablet Take 81 mg by mouth daily.  [name] atorvastatin (LIPITOR) 40 MG tablet Take 1 tablet (40 mg total) by mouth daily.  . benazepril (LOTENSIN) 40 MG tablet Take 0.5 tablets (20 mg total) by mouth daily.  [name] esomeprazole (NEXIUM) 40 MG capsule Take 1 capsule (40 mg total) by mouth daily as needed (for heartburn or indigestion). (Patient taking differently: Take 40 mg by mouth daily.)  . Fluticasone-Salmeterol (ADVAIR) 250-50 MCG/DOSE AEPB Inhale 1 puff into the lungs 2 (two) times daily.  [name] gabapentin (NEURONTIN) 400 MG capsule TAKE 1  CAPSULE BY MOUTH  TWICE DAILY (Patient taking differently: Take 400 mg by mouth 2 (two) times daily.)  . ibuprofen (ADVIL) 200 MG tablet Take 800 mg by mouth every 6 (six) hours as needed (inflammation).  . metoprolol succinate (TOPROL-XL) 50 MG 24 hr tablet TAKE 1 TABLET(50 MG) BY MOUTH DAILY WITH OR IMMEDIATELY FOLLOWING A MEAL  . mirabegron ER (MYRBETRIQ) 50 MG TB24 tablet Take 1 tablet (50 mg total) by mouth daily.  . potassium chloride (KLOR-CON) 10 MEQ tablet Take 1 tablet (10 mEq total) by mouth 2 (two) times daily.  . predniSONE (DELTASONE) 10 MG tablet Take 4 tabs po daily x 2 days; then 3 tabs for 2 days; then 2 tabs for 2 days; then 1 tab for 2 days  . PRESCRIPTION MEDICATION Inhale into the lungs at bedtime. cpap  . rivaroxaban (XARELTO) 20 MG TABS tablet Take 1 tablet (20 mg total) by mouth every morning.  . tiotropium (SPIRIVA HANDIHALER) 18 MCG inhalation capsule PLACE 1 CAPSULE INTO INHALER AND INHALE DAILY (Patient taking differently: Place 18 mcg into inhaler and inhale daily.)  . torsemide (DEMADEX) 20 MG tablet TAKE 2 TABLETS BY MOUTH DAILY (Patient taking differently: Take 20 mg by mouth 2 (two) times daily.)   No facility-administered encounter medications on file as of 05/21/2020.    Patient Active Problem List   Diagnosis Date Noted  . Hyperlipidemia 05/14/2020  . Acute respiratory failure with hypoxia ([place]) 04/10/2020  . Hypertensive urgency 04/10/2020  . Swallowing difficulty 11/10/2019  . Chronic respiratory failure with hypoxia ([place]) 09/30/2019  . Mixed incontinence, urge and stress (female) (female)  08/21/2019  . Overgrown toenails 07/01/2019  . Preventative health care 06/04/2019  . GERD (gastroesophageal reflux disease) 01/20/2019  . Coronary artery calcification seen on CT scan 04/12/2018  . ILD (interstitial lung disease) ([place]) 02/13/2018  . Pain and numbness of right upper extremity 02/08/2018  . Long term current use of amiodarone 12/19/2017  . OSA  (obstructive sleep apnea) 11/14/2017  . Chronic pain syndrome 07/27/2017  . Major depression, recurrent, chronic ([place]) 02/01/2017  . Osteoporosis 12/14/2016  . Drug induced constipation 08/21/2016  . Elevated blood sugar 07/19/2016  . Aortic atherosclerosis ([place]) 07/17/2016  . Chronic venous insufficiency 07/12/2016  . Chronic anticoagulation 07/12/2016  . Tobacco use 07/11/2016  . Acute exacerbation of chronic obstructive pulmonary disease (COPD) ([place])   . Diastolic heart failure ([place])   . History of pulmonary embolism   . Atrial fibrillation with controlled ventricular response ([place])   . Vocal cord polyp 12/18/2015  . Laryngopharyngeal reflux 12/18/2015  . Morbid obesity ([place])   . Peripheral vascular disease ([place])   . Benign essential HTN   . Primary osteoarthritis of both knees 09/16/2014    Conditions to be addressed/monitored:HF, a fib, HTN, HLD, hx of PE and DVT, PVD, COPD, ILD, OSA, GERD, OA, anxiety/depression, chronic pain syndrome, incontinence, morbid obesity   Care Plan : CCM RN- COPD (Adult)  Updates made by Barrington Ellison, RN since 05/21/2020 12:00 AM    Problem: Symptom Exacerbation (COPD)     Goal: Symptom Exacerbation Prevented or Minimized   Start Date: 06/24/2019  Expected End Date: 12/24/2020  Recent Progress: On track  Priority: High  Note:   CARE PLAN ENTRY (see longitudinal plan of care for additional care plan information)  Current Barriers:  . Chronic Disease Management support, education, and care coordination needs related to Atrial Fibrillation, CHF, HTN, and COPD- 05/10/20- spoke with patient via phone to complete follow up assessment and transition of care assessment, she was discharged to her apartment from [facility] on 05/06/20 after an approximated 3 week rehab stay following hospitalization from 1/8-1/13 for COPD exacerbation with chronic interstitial lung disease, states she is doing well, glad to be back in her apartment as the stay in the  rehab facility was not pleasant and back to baseline with ADLS and IADLS, she says her sister stayed with her the first 3 days she came home from the SNF and she will return today to stay with her, she says she did not qualify for home O2 so she is using her neighbors O2 concentrator to use O2 prn, she says she mainly becomes short of breath with exertion, she says she has follow up appointments this week with pain management, pulmonary and primary care and will make a follow up with her cardiologist soon  Clinical Goal(s) related to Atrial Fibrillation, CHF, HTN, and COPD:  Over the next 30 days, patient will:  . Work with the care management team to address educational, disease management, and care coordination needs  . Begin or continue self health monitoring activities as directed today Measure and record blood pressure 5-7 times per week and assess swelling in lower extremities daily, check O2 sats when short of breath  . Call provider office for new or worsened signs and symptoms Blood pressure findings outside established parameters, breathing outside of baseline,  Shortness of breath, and New or worsened symptom related to CHF and COPD . Call care management team with questions or concerns . Verbalize basic understanding of patient centered plan of  care established today  Interventions related to Atrial Fibrillation, CHF, HTN, and COPD:  . Appropriate assessments completed . Reviewed medications with patient and assessed medication taking behavior;  if indicated, discussed importance of medication adherence  . Reviewed upcoming appointments and ensured she has transportation . Discussed plans with patient for ongoing care management follow up and ensured patient has contact number for CCM team and clinic . 05/17/20- Left message with [place] requesting return call form patient's Medicaid case worker . 05/21/20- Left a 2nd message with [place] in response to voice message left for this CCM RN on 05/19/20 requesting return call from patient's Medicaid case worker . 05/21/20- Sent community message to [employer] representative re: patient's call to clinic on 05/20/20 requesting portable O2 concentrator  Patient Self Care Activities related to Atrial Fibrillation, CHF, HTN, and COPD:  . Patient is unable to independently self-manage chronic health conditions       Plan:The care management team will reach out to the patient again over the next 30-60 days.   Kelli Churn RN, CCM, [facility] RN Care Manager [phone]

## 2020-05-21 NOTE — Progress Notes (Signed)
Internal Medicine Clinic Resident  I have personally reviewed this encounter including the documentation in this note and/or discussed this patient with the care management provider. I will address any urgent items identified by the care management provider and will communicate my actions to the patient's PCP. I have reviewed the patient's CCM visit with my supervising attending, Dr Dareen Piano.  Mosetta Anis, MD 05/21/2020

## 2020-05-25 ENCOUNTER — Ambulatory Visit: Payer: Medicare Other | Admitting: *Deleted

## 2020-05-25 DIAGNOSIS — J441 Chronic obstructive pulmonary disease with (acute) exacerbation: Secondary | ICD-10-CM

## 2020-05-25 DIAGNOSIS — G894 Chronic pain syndrome: Secondary | ICD-10-CM

## 2020-05-25 DIAGNOSIS — J9611 Chronic respiratory failure with hypoxia: Secondary | ICD-10-CM

## 2020-05-25 DIAGNOSIS — G4733 Obstructive sleep apnea (adult) (pediatric): Secondary | ICD-10-CM | POA: Diagnosis not present

## 2020-05-25 DIAGNOSIS — I10 Essential (primary) hypertension: Secondary | ICD-10-CM

## 2020-05-25 DIAGNOSIS — I5033 Acute on chronic diastolic (congestive) heart failure: Secondary | ICD-10-CM

## 2020-05-25 DIAGNOSIS — I4891 Unspecified atrial fibrillation: Secondary | ICD-10-CM

## 2020-05-25 DIAGNOSIS — I739 Peripheral vascular disease, unspecified: Secondary | ICD-10-CM

## 2020-05-25 DIAGNOSIS — E782 Mixed hyperlipidemia: Secondary | ICD-10-CM

## 2020-05-25 NOTE — Chronic Care Management (AMB) (Signed)
Chronic Care Management   CCM RN Visit Note  05/25/2020 Name: Sharon Vasquez [name]: [id number] DOB: 10-12-1950  Subjective: Sharon Vasquez is a 70 y.o. year old female who is a primary care patient of [name], [name], MD. The care management team was consulted for assistance with disease management and care coordination.   Consent to Services:  The patient was given information about Chronic Care Management services, agreed to services, and gave verbal consent prior to initiation of services.  Please see initial visit note for detailed documentation.   Patient agreed to services and verbal consent obtained.   Assessment: Review of patient past medical history, allergies, medications, health status, including review of consultants reports, laboratory and other test data, was performed as part of comprehensive evaluation and provision of chronic care management services.   SDOH (Social Determinants of Health) assessments and interventions performed:    CCM Care Plan  No Known Allergies  Outpatient Encounter Medications as of 05/25/2020  Medication Sig  . albuterol (PROVENTIL) (2.5 MG/3ML) 0.083% nebulizer solution TAKE 3 MLS(1 AMPULE) BY NEBULIZATION EVERY 4 HOURS AS NEEDED FOR WHHEZING OR SHORTNESS OF BREATH (Patient taking differently: Take 2.5 mg by nebulization every 4 (four) hours as needed for wheezing or shortness of breath.)  . aspirin EC 81 MG tablet Take 81 mg by mouth daily.  [name] atorvastatin (LIPITOR) 40 MG tablet Take 1 tablet (40 mg total) by mouth daily.  . benazepril (LOTENSIN) 40 MG tablet Take 0.5 tablets (20 mg total) by mouth daily.  [name] esomeprazole (NEXIUM) 40 MG capsule Take 1 capsule (40 mg total) by mouth daily as needed (for heartburn or indigestion). (Patient taking differently: Take 40 mg by mouth daily.)  . Fluticasone-Salmeterol (ADVAIR) 250-50 MCG/DOSE AEPB Inhale 1 puff into the lungs 2 (two) times daily.  [name] gabapentin (NEURONTIN) 400 MG capsule TAKE 1 CAPSULE BY  MOUTH  TWICE DAILY (Patient taking differently: Take 400 mg by mouth 2 (two) times daily.)  . ibuprofen (ADVIL) 200 MG tablet Take 800 mg by mouth every 6 (six) hours as needed (inflammation).  . metoprolol succinate (TOPROL-XL) 50 MG 24 hr tablet TAKE 1 TABLET(50 MG) BY MOUTH DAILY WITH OR IMMEDIATELY FOLLOWING A MEAL  . mirabegron ER (MYRBETRIQ) 50 MG TB24 tablet Take 1 tablet (50 mg total) by mouth daily.  . potassium chloride (KLOR-CON) 10 MEQ tablet Take 1 tablet (10 mEq total) by mouth 2 (two) times daily.  . predniSONE (DELTASONE) 10 MG tablet Take 4 tabs po daily x 2 days; then 3 tabs for 2 days; then 2 tabs for 2 days; then 1 tab for 2 days  . PRESCRIPTION MEDICATION Inhale into the lungs at bedtime. cpap  . rivaroxaban (XARELTO) 20 MG TABS tablet Take 1 tablet (20 mg total) by mouth every morning.  . tiotropium (SPIRIVA HANDIHALER) 18 MCG inhalation capsule PLACE 1 CAPSULE INTO INHALER AND INHALE DAILY (Patient taking differently: Place 18 mcg into inhaler and inhale daily.)  . torsemide (DEMADEX) 20 MG tablet TAKE 2 TABLETS BY MOUTH DAILY (Patient taking differently: Take 20 mg by mouth 2 (two) times daily.)   No facility-administered encounter medications on file as of 05/25/2020.    Patient Active Problem List   Diagnosis Date Noted  . Hyperlipidemia 05/14/2020  . Acute respiratory failure with hypoxia ([place]) 04/10/2020  . Hypertensive urgency 04/10/2020  . Swallowing difficulty 11/10/2019  . Chronic respiratory failure with hypoxia ([place]) 09/30/2019  . Mixed incontinence, urge and stress (female) (female) 08/21/2019  .  Overgrown toenails 07/01/2019  . Preventative health care 06/04/2019  . GERD (gastroesophageal reflux disease) 01/20/2019  . Coronary artery calcification seen on CT scan 04/12/2018  . ILD (interstitial lung disease) ([place]) 02/13/2018  . Pain and numbness of right upper extremity 02/08/2018  . Long term current use of amiodarone 12/19/2017  . OSA (obstructive sleep  apnea) 11/14/2017  . Chronic pain syndrome 07/27/2017  . Major depression, recurrent, chronic ([place]) 02/01/2017  . Osteoporosis 12/14/2016  . Drug induced constipation 08/21/2016  . Elevated blood sugar 07/19/2016  . Aortic atherosclerosis ([place]) 07/17/2016  . Chronic venous insufficiency 07/12/2016  . Chronic anticoagulation 07/12/2016  . Tobacco use 07/11/2016  . Acute exacerbation of chronic obstructive pulmonary disease (COPD) ([place])   . Diastolic heart failure ([place])   . History of pulmonary embolism   . Atrial fibrillation with controlled ventricular response ([place])   . Vocal cord polyp 12/18/2015  . Laryngopharyngeal reflux 12/18/2015  . Morbid obesity ([place])   . Peripheral vascular disease ([place])   . Benign essential HTN   . Primary osteoarthritis of both knees 09/16/2014    Conditions to be addressed/monitored:HF, a fib, HTN, HLD, hx of PE and DVT, PVD, COPD, ILD, OSA, GERD, OA, anxiety/depression, chronic pain syndrome, incontinence, morbid obesity   Care Plan : CCM RN- COPD (Adult)  Updates made by Barrington Ellison, RN since 05/25/2020 12:00 AM    Problem: Symptom Exacerbation (COPD)     Goal: Symptom Exacerbation Prevented or Minimized   Start Date: 06/24/2019  Expected End Date: 12/24/2020  Recent Progress: On track  Priority: High  Note:   CARE PLAN ENTRY (see longitudinal plan of care for additional care plan information)  Current Barriers:  . Chronic Disease Management support, education, and care coordination needs related to Atrial Fibrillation, CHF, HTN, and COPD- 05/10/20- spoke with patient via phone to provide her with website to apply for Medicaid on line, states she is doing well, received her O2 concentrator, she says her sister has returned to her home, patient says she is trying to be more active per Dr Lianne Moris advice, she says she will finish her prednisone today and says it has really helped her legs so she is hopeful that stopping the prednisone will not cause  symptoms to return, she says she is suppose to hear from her orthopedist about a procedure to treat her legs and she will call this CCM RN with the name of the procedure  Clinical Goal(s) related to Atrial Fibrillation, CHF, HTN, and COPD:  Over the next 30 days, patient will:  . Work with the care management team to address educational, disease management, and care coordination needs  . Begin or continue self health monitoring activities as directed today Measure and record blood pressure 5-7 times per week and assess swelling in lower extremities daily, check O2 sats when short of breath  . Call provider office for new or worsened signs and symptoms Blood pressure findings outside established parameters, breathing outside of baseline,  Shortness of breath, and New or worsened symptom related to CHF and COPD . Call care management team with questions or concerns . Verbalize basic understanding of patient centered plan of care established today  Interventions related to Atrial Fibrillation, CHF, HTN, and COPD:  . Appropriate assessments completed . Reviewed medications with patient and assessed medication taking behavior;  if indicated, discussed importance of medication adherence  . Reviewed upcoming appointments and ensured she has transportation . Discussed plans with patient for ongoing care management  follow up and ensured patient has contact number for CCM team and clinic . 05/17/20- Left message with [place] requesting return call form patient's Medicaid case worker . 05/21/20- Left a 2nd message with [place] in response to voice message left for this CCM RN on 05/19/20 requesting return call from patient's Medicaid case worker . 05/21/20 Sent community message to [employer] representative re: patient's call to clinic on 05/20/20 requesting portable O2 concentrator. Received the following response from  Skeet Latch with [employer]- "We delivered the patient a concentrator and homefill station on 05/20/20. The patient signed the delivery ticket at 12:33pm. The patient is also in collections because she has a high overdue balance. " . 05/25/20- spoke with patient to inform her this CCM RN spoke with Lonn Georgia at [facility] and per Kayla's direction, provided patient with website address ( [email or web address]) to apply for Medicaid on line  . 05/25/20 6:11 pm patient left voice message stating the orthopedist wants to use hyaluronic acid injections to treat her osteoarthritis pain, procedure number [id number]. She is requesting educational information on the procedure.  Patient Self Care Activities related to Atrial Fibrillation, CHF, HTN, and COPD:  . Patient is unable to independently self-manage chronic health conditions       Plan:The care management team will reach out to the patient again over the next 30-60 days.   Kelli Churn RN, CCM, [facility] RN Care Manager [phone]

## 2020-05-25 NOTE — Patient Instructions (Signed)
Visit Information It was nice speaking with you today. PATIENT GOALS: Patient Care Plan: CCM RN- COPD (Adult)    Problem Identified: Symptom Exacerbation (COPD)     Goal: Symptom Exacerbation Prevented or Minimized   Start Date: 06/24/2019  Expected End Date: 12/24/2020  Recent Progress: On track  Priority: High  Note:   CARE PLAN ENTRY (see longitudinal plan of care for additional care plan information)  Current Barriers:  . Chronic Disease Management support, education, and care coordination needs related to Atrial Fibrillation, CHF, HTN, and COPD- 05/10/20- spoke with patient via phone to provide her with website to apply for Medicaid on line, states she is doing well, received her O2 concentrator, she says her sister has returned to her home, patient says she is trying to be more active per Dr Lianne Moris advice, she says she will finish her prednisone today and says it has really helped her legs so she is hopeful that stopping the prednisone will not cause symptoms to return, she says she is suppose to hear from her orthopedist about a procedure to treat her legs and she will call this CCM RN with the name of the procedure  Clinical Goal(s) related to Atrial Fibrillation, CHF, HTN, and COPD:  Over the next 30 days, patient will:  . Work with the care management team to address educational, disease management, and care coordination needs  . Begin or continue self health monitoring activities as directed today Measure and record blood pressure 5-7 times per week and assess swelling in lower extremities daily, check O2 sats when short of breath  . Call provider office for new or worsened signs and symptoms Blood pressure findings outside established parameters, breathing outside of baseline,  Shortness of breath, and New or worsened symptom related to CHF and COPD . Call care management team with questions or concerns . Verbalize basic understanding of patient centered plan of care established  today  Interventions related to Atrial Fibrillation, CHF, HTN, and COPD:  . Appropriate assessments completed . Reviewed medications with patient and assessed medication taking behavior;  if indicated, discussed importance of medication adherence  . Reviewed upcoming appointments and ensured she has transportation . Discussed plans with patient for ongoing care management follow up and ensured patient has contact number for CCM team and clinic . 05/17/20- Left message with [place] requesting return call form patient's Medicaid case worker . 05/21/20- Left a 2nd message with [place] in response to voice message left for this CCM RN on 05/19/20 requesting return call from patient's Medicaid case worker . 05/21/20 Sent community message to [employer] representative re: patient's call to clinic on 05/20/20 requesting portable O2 concentrator. Received the following response from Skeet Latch with [employer]- "We delivered the patient a concentrator and homefill station on 05/20/20. The patient signed the delivery ticket at 12:33pm. The patient is also in collections because she has a high overdue balance. " . 05/25/20- spoke with patient to inform her this CCM RN spoke with Lonn Georgia at [place] and per [place]la's direction, provided patient with website address ( [email or web address]) to apply for Medicaid on line   Patient Self Care Activities related to Atrial Fibrillation, CHF, HTN, and COPD:  . Patient is unable to independently self-manage chronic health conditions       The patient verbalized understanding of instructions, educational materials, and care plan provided today and declined offer to receive  copy of patient instructions, educational materials, and care plan.   The care management team will reach out to the patient again over the next 30 days.   Kelli Churn RN, CCM, [facility] RN Care Manager [phone]

## 2020-05-25 NOTE — Chronic Care Management (AMB) (Signed)
Chronic Care Management   CCM RN Visit Note  05/25/2020 Name: Sharon Vasquez [name]: [id number] DOB: 1950-07-15  Subjective: Sharon Vasquez is a 70 y.o. year old female who is a primary care patient of [name], [name], MD. The care management team was consulted for assistance with disease management and care coordination needs.    Engaged with patient by telephone for follow up visit in response to provider referral for case management and/or care coordination services.   Consent to Services:  The patient was given information about Chronic Care Management services, agreed to services, and gave verbal consent prior to initiation of services.  Please see initial visit note for detailed documentation.   Patient agreed to services and verbal consent obtained.   Assessment: Review of patient past medical history, allergies, medications, health status, including review of consultants reports, laboratory and other test data, was performed as part of comprehensive evaluation and provision of chronic care management services.   SDOH (Social Determinants of Health) assessments and interventions performed:    CCM Care Plan  No Known Allergies  Outpatient Encounter Medications as of 05/25/2020  Medication Sig  . albuterol (PROVENTIL) (2.5 MG/3ML) 0.083% nebulizer solution TAKE 3 MLS(1 AMPULE) BY NEBULIZATION EVERY 4 HOURS AS NEEDED FOR WHHEZING OR SHORTNESS OF BREATH (Patient taking differently: Take 2.5 mg by nebulization every 4 (four) hours as needed for wheezing or shortness of breath.)  . aspirin EC 81 MG tablet Take 81 mg by mouth daily.  [name] atorvastatin (LIPITOR) 40 MG tablet Take 1 tablet (40 mg total) by mouth daily.  . benazepril (LOTENSIN) 40 MG tablet Take 0.5 tablets (20 mg total) by mouth daily.  [name] esomeprazole (NEXIUM) 40 MG capsule Take 1 capsule (40 mg total) by mouth daily as needed (for heartburn or indigestion). (Patient taking differently: Take 40 mg by mouth daily.)  .  Fluticasone-Salmeterol (ADVAIR) 250-50 MCG/DOSE AEPB Inhale 1 puff into the lungs 2 (two) times daily.  [name] gabapentin (NEURONTIN) 400 MG capsule TAKE 1 CAPSULE BY MOUTH  TWICE DAILY (Patient taking differently: Take 400 mg by mouth 2 (two) times daily.)  . ibuprofen (ADVIL) 200 MG tablet Take 800 mg by mouth every 6 (six) hours as needed (inflammation).  . metoprolol succinate (TOPROL-XL) 50 MG 24 hr tablet TAKE 1 TABLET(50 MG) BY MOUTH DAILY WITH OR IMMEDIATELY FOLLOWING A MEAL  . mirabegron ER (MYRBETRIQ) 50 MG TB24 tablet Take 1 tablet (50 mg total) by mouth daily.  . potassium chloride (KLOR-CON) 10 MEQ tablet Take 1 tablet (10 mEq total) by mouth 2 (two) times daily.  . predniSONE (DELTASONE) 10 MG tablet Take 4 tabs po daily x 2 days; then 3 tabs for 2 days; then 2 tabs for 2 days; then 1 tab for 2 days  . PRESCRIPTION MEDICATION Inhale into the lungs at bedtime. cpap  . rivaroxaban (XARELTO) 20 MG TABS tablet Take 1 tablet (20 mg total) by mouth every morning.  . tiotropium (SPIRIVA HANDIHALER) 18 MCG inhalation capsule PLACE 1 CAPSULE INTO INHALER AND INHALE DAILY (Patient taking differently: Place 18 mcg into inhaler and inhale daily.)  . torsemide (DEMADEX) 20 MG tablet TAKE 2 TABLETS BY MOUTH DAILY (Patient taking differently: Take 20 mg by mouth 2 (two) times daily.)   No facility-administered encounter medications on file as of 05/25/2020.    Patient Active Problem List   Diagnosis Date Noted  . Hyperlipidemia 05/14/2020  . Acute respiratory failure with hypoxia ([place]) 04/10/2020  . Hypertensive urgency 04/10/2020  .  Swallowing difficulty 11/10/2019  . Chronic respiratory failure with hypoxia ([place]) 09/30/2019  . Mixed incontinence, urge and stress (female) (female) 08/21/2019  . Overgrown toenails 07/01/2019  . Preventative health care 06/04/2019  . GERD (gastroesophageal reflux disease) 01/20/2019  . Coronary artery calcification seen on CT scan 04/12/2018  . ILD (interstitial  lung disease) ([place]) 02/13/2018  . Pain and numbness of right upper extremity 02/08/2018  . Long term current use of amiodarone 12/19/2017  . OSA (obstructive sleep apnea) 11/14/2017  . Chronic pain syndrome 07/27/2017  . Major depression, recurrent, chronic ([place]) 02/01/2017  . Osteoporosis 12/14/2016  . Drug induced constipation 08/21/2016  . Elevated blood sugar 07/19/2016  . Aortic atherosclerosis ([place]) 07/17/2016  . Chronic venous insufficiency 07/12/2016  . Chronic anticoagulation 07/12/2016  . Tobacco use 07/11/2016  . Acute exacerbation of chronic obstructive pulmonary disease (COPD) ([place])   . Diastolic heart failure ([place])   . History of pulmonary embolism   . Atrial fibrillation with controlled ventricular response ([place])   . Vocal cord polyp 12/18/2015  . Laryngopharyngeal reflux 12/18/2015  . Morbid obesity ([place])   . Peripheral vascular disease ([place])   . Benign essential HTN   . Primary osteoarthritis of both knees 09/16/2014    Conditions to be addressed/monitored:HF, a fib, HTN, HLD, hx of PE and DVT, PVD, COPD, ILD, OSA, GERD, OA, anxiety/depression, chronic pain syndrome, incontinence, morbid obesity  Care Plan : CCM RN- COPD (Adult)  Updates made by Barrington Ellison, RN since 05/25/2020 12:00 AM    Problem: Symptom Exacerbation (COPD)     Goal: Symptom Exacerbation Prevented or Minimized   Start Date: 06/24/2019  Expected End Date: 12/24/2020  Recent Progress: On track  Priority: High  Note:   CARE PLAN ENTRY (see longitudinal plan of care for additional care plan information)  Current Barriers:  . Chronic Disease Management support, education, and care coordination needs related to Atrial Fibrillation, CHF, HTN, and COPD- 05/10/20- spoke with patient via phone to provide her with website to apply for Medicaid on line, states she is doing well, received her O2 concentrator, she says her sister has returned to her home, patient says she is trying to be more active per  Dr Lianne Moris advice, she says she will finish her prednisone today and says it has really helped her legs so she is hopeful that stopping the prednisone will not cause symptoms to return, she says she is suppose to hear from her orthopedist about a procedure to treat her legs and she will call this CCM RN with the name of the procedure  Clinical Goal(s) related to Atrial Fibrillation, CHF, HTN, and COPD:  Over the next 30 days, patient will:  . Work with the care management team to address educational, disease management, and care coordination needs  . Begin or continue self health monitoring activities as directed today Measure and record blood pressure 5-7 times per week and assess swelling in lower extremities daily, check O2 sats when short of breath  . Call provider office for new or worsened signs and symptoms Blood pressure findings outside established parameters, breathing outside of baseline,  Shortness of breath, and New or worsened symptom related to CHF and COPD . Call care management team with questions or concerns . Verbalize basic understanding of patient centered plan of care established today  Interventions related to Atrial Fibrillation, CHF, HTN, and COPD:  . Appropriate assessments completed . Reviewed medications with patient and assessed medication taking behavior;  if indicated, discussed  importance of medication adherence  . Reviewed upcoming appointments and ensured she has transportation . Discussed plans with patient for ongoing care management follow up and ensured patient has contact number for CCM team and clinic . 05/17/20- Left message with [place] requesting return call form patient's Medicaid case worker . 05/21/20- Left a 2nd message with [place] in response to voice message left for this CCM RN on 05/19/20 requesting return call from patient's Medicaid case worker . 05/21/20  Sent community message to [employer] representative re: patient's call to clinic on 05/20/20 requesting portable O2 concentrator. Received the following response from Skeet Latch with [employer]- "We delivered the patient a concentrator and homefill station on 05/20/20. The patient signed the delivery ticket at 12:33pm. The patient is also in collections because she has a high overdue balance. " . 05/25/20- spoke with patient to inform her this CCM RN spoke with Lonn Georgia at [place] and per Kayla's direction, provided patient with website address ( [email or web address]) to apply for Medicaid on line   Patient Self Care Activities related to Atrial Fibrillation, CHF, HTN, and COPD:  . Patient is unable to independently self-manage chronic health conditions       Plan:The care management team will reach out to the patient again over the next 30 days.   Kelli Churn RN, CCM, [facility] RN Care Manager [phone]

## 2020-05-26 NOTE — Progress Notes (Signed)
Internal Medicine Clinic Resident  I have personally reviewed this encounter including the documentation in this note and/or discussed this patient with the care management provider. I will address any urgent items identified by the care management provider and will communicate my actions to the patient's PCP. I have reviewed the patient's CCM visit with my supervising attending, Dr Mullen.  Jeffrey Rella Egelston, MD 05/26/2020    

## 2020-05-26 NOTE — Progress Notes (Signed)
Internal Medicine Clinic Attending  CCM services provided by the care management provider and their documentation were discussed with Dr. Steen. We reviewed the pertinent findings, urgent action items addressed by the resident and non-urgent items to be addressed by the PCP.  I agree with the assessment, diagnosis, and plan of care documented in the CCM and resident's note.  Emily Mullen, MD 05/26/2020  

## 2020-06-07 ENCOUNTER — Ambulatory Visit: Payer: Medicare Other | Admitting: *Deleted

## 2020-06-07 DIAGNOSIS — J9611 Chronic respiratory failure with hypoxia: Secondary | ICD-10-CM

## 2020-06-07 DIAGNOSIS — I5033 Acute on chronic diastolic (congestive) heart failure: Secondary | ICD-10-CM

## 2020-06-07 DIAGNOSIS — G894 Chronic pain syndrome: Secondary | ICD-10-CM

## 2020-06-07 DIAGNOSIS — I4891 Unspecified atrial fibrillation: Secondary | ICD-10-CM

## 2020-06-07 DIAGNOSIS — I10 Essential (primary) hypertension: Secondary | ICD-10-CM

## 2020-06-07 NOTE — Chronic Care Management (AMB) (Signed)
Chronic Care Management   CCM RN Visit Note  06/07/2020 Name: Sharon Vasquez [name]: [id number] DOB: [birth year]  Subjective: Sharon Vasquez is a 70 y.o. year old female who is a primary care patient of [name], [name], MD. The care management team was consulted for assistance with disease management and care coordination needs.    Engaged with patient by telephone for follow up visit in response to provider referral for case management and/or care coordination services.   Consent to Services:  The patient was given information about Chronic Care Management services, agreed to services, and gave verbal consent prior to initiation of services.  Please see initial visit note for detailed documentation.   Patient agreed to services and verbal consent obtained.   Assessment: Review of patient past medical history, allergies, medications, health status, including review of consultants reports, laboratory and other test data, was performed as part of comprehensive evaluation and provision of chronic care management services.   SDOH (Social Determinants of Health) assessments and interventions performed:    CCM Care Plan  No Known Allergies  Outpatient Encounter Medications as of 06/07/2020  Medication Sig  . albuterol (PROVENTIL) (2.5 MG/3ML) 0.083% nebulizer solution TAKE 3 MLS(1 AMPULE) BY NEBULIZATION EVERY 4 HOURS AS NEEDED FOR WHHEZING OR SHORTNESS OF BREATH (Patient taking differently: Take 2.5 mg by nebulization every 4 (four) hours as needed for wheezing or shortness of breath.)  . aspirin EC 81 MG tablet Take 81 mg by mouth daily.  [name] atorvastatin (LIPITOR) 40 MG tablet Take 1 tablet (40 mg total) by mouth daily.  . benazepril (LOTENSIN) 40 MG tablet Take 0.5 tablets (20 mg total) by mouth daily.  [name] esomeprazole (NEXIUM) 40 MG capsule Take 1 capsule (40 mg total) by mouth daily as needed (for heartburn or indigestion). (Patient taking differently: Take 40 mg by mouth daily.)  .  Fluticasone-Salmeterol (ADVAIR) 250-50 MCG/DOSE AEPB Inhale 1 puff into the lungs 2 (two) times daily.  [name] gabapentin (NEURONTIN) 400 MG capsule TAKE 1 CAPSULE BY MOUTH  TWICE DAILY (Patient taking differently: Take 400 mg by mouth 2 (two) times daily.)  . ibuprofen (ADVIL) 200 MG tablet Take 800 mg by mouth every 6 (six) hours as needed (inflammation).  . metoprolol succinate (TOPROL-XL) 50 MG 24 hr tablet TAKE 1 TABLET(50 MG) BY MOUTH DAILY WITH OR IMMEDIATELY FOLLOWING A MEAL  . mirabegron ER (MYRBETRIQ) 50 MG TB24 tablet Take 1 tablet (50 mg total) by mouth daily.  . potassium chloride (KLOR-CON) 10 MEQ tablet Take 1 tablet (10 mEq total) by mouth 2 (two) times daily.  . predniSONE (DELTASONE) 10 MG tablet Take 4 tabs po daily x 2 days; then 3 tabs for 2 days; then 2 tabs for 2 days; then 1 tab for 2 days  . PRESCRIPTION MEDICATION Inhale into the lungs at bedtime. cpap  . rivaroxaban (XARELTO) 20 MG TABS tablet Take 1 tablet (20 mg total) by mouth every morning.  . tiotropium (SPIRIVA HANDIHALER) 18 MCG inhalation capsule PLACE 1 CAPSULE INTO INHALER AND INHALE DAILY (Patient taking differently: Place 18 mcg into inhaler and inhale daily.)  . torsemide (DEMADEX) 20 MG tablet TAKE 2 TABLETS BY MOUTH DAILY (Patient taking differently: Take 20 mg by mouth 2 (two) times daily.)   No facility-administered encounter medications on file as of 06/07/2020.    Patient Active Problem List   Diagnosis Date Noted  . Hyperlipidemia 05/14/2020  . Acute respiratory failure with hypoxia ([place]) 04/10/2020  . Hypertensive urgency 04/10/2020  .  Swallowing difficulty 11/10/2019  . Chronic respiratory failure with hypoxia ([place]) 09/30/2019  . Mixed incontinence, urge and stress (female) (female) 08/21/2019  . Overgrown toenails 07/01/2019  . Preventative health care 06/04/2019  . GERD (gastroesophageal reflux disease) 01/20/2019  . Coronary artery calcification seen on CT scan 04/12/2018  . ILD (interstitial lung  disease) ([place]) 02/13/2018  . Pain and numbness of right upper extremity 02/08/2018  . Long term current use of amiodarone 12/19/2017  . OSA (obstructive sleep apnea) 11/14/2017  . Chronic pain syndrome 07/27/2017  . Major depression, recurrent, chronic ([place]) 02/01/2017  . Osteoporosis 12/14/2016  . Drug induced constipation 08/21/2016  . Elevated blood sugar 07/19/2016  . Aortic atherosclerosis ([place]) 07/17/2016  . Chronic venous insufficiency 07/12/2016  . Chronic anticoagulation 07/12/2016  . Tobacco use 07/11/2016  . Acute exacerbation of chronic obstructive pulmonary disease (COPD) ([place])   . Diastolic heart failure ([place])   . History of pulmonary embolism   . Atrial fibrillation with controlled ventricular response ([place])   . Vocal cord polyp 12/18/2015  . Laryngopharyngeal reflux 12/18/2015  . Morbid obesity ([place])   . Peripheral vascular disease ([place])   . Benign essential HTN   . Primary osteoarthritis of both knees 09/16/2014    Conditions to be addressed/monitored:HF, a fib, HTN, HLD, hx of PE and DVT, PVD, COPD, ILD, OSA, GERD, OA, anxiety/depression, chronic pain syndrome, incontinence, morbid obesity  Care Plan : CCM RN- Chronic pain- Adult  Updates made by Barrington Ellison, RN since 06/07/2020 12:00 AM    Problem: Chronic Pain Management (Chronic Pain)     Long-Range Goal: Chronic Bilateral Knee Pain Managed   Start Date: 06/07/2020  Expected End Date: 09/30/2020  This Visit's Progress: On track  Priority: High  Note:   Current Barriers:  [name] Knowledge Deficits related to managing acute/chronic pain- patient states her orthopedic MD Dr Berenice Primas wants to treat her knee pain with hyaluronic acid injections and she has questions about how it works and the treatment's efficacy . Unable to independently manage chronic pain Nurse Case Manager Clinical Goal(s):  . patient will verbalize understanding of plan for managing pain . patient will attend all scheduled medical appointments:  for knee injections . patient will report pain at a level less than 3 to 4 on a 10-10 rating scale . patient will use pharmacological and nonpharmacological pain relief strategies . Patient will verbalize good understanding of hyaluronic knee injections . patient will verbalize acceptable level of pain relief and ability to engage in desired activities . patient will engage in desired activities without an increase in pain level Interventions:   Collaboration with [name] [name], MD regarding development and update of comprehensive plan of care as evidenced by provider attestation and co-signature  Inter-disciplinary care team collaboration (see longitudinal plan of care)  - effectiveness of pharmacologic therapy monitored  - pain assessed  - pain treatment goals reviewed  Evaluation of current treatment plan related to bilateral chronic knee pain and patient's adherence to plan as established by provider  Provided education to patient re: Emmi article on viscosupplementation  Reviewed research on procedure and efficacy reports  Discussed plans with patient for ongoing care management follow up and provided patient with direct contact information for care management team Patient Goals/Self Care Activities:   . Self-administers medications as prescribed . Attends all scheduled provider appointments including hyaluronic acid knee injection appointments  . Calls pharmacy for medication refills . Calls provider office for new concerns or questions Follow Up  Plan: The care management team will reach out to the patient again over the next 30-60 days.        Plan:The care management team will reach out to the patient again over the next 30-60 days.  Kelli Churn RN, CCM, [facility] RN Care Manager [phone]

## 2020-06-07 NOTE — Patient Instructions (Signed)
Visit Information It was nice speaking with you today. PATIENT GOALS: Patient Care Plan: CCM RN- COPD (Adult)    Problem Identified: Symptom Exacerbation (COPD)     Goal: Symptom Exacerbation Prevented or Minimized   Start Date: 06/24/2019  Expected End Date: 12/24/2020  Recent Progress: On track  Priority: High  Note:   CARE PLAN ENTRY (see longitudinal plan of care for additional care plan information)  Current Barriers:  . Chronic Disease Management support, education, and care coordination needs related to Atrial Fibrillation, CHF, HTN, and COPD- 05/10/20- spoke with patient via phone to provide her with website to apply for Medicaid on line, states she is doing well, received her O2 concentrator, she says her sister has returned to her home, patient says she is trying to be more active per Dr Lianne Moris advice, she says she will finish her prednisone today and says it has really helped her legs so she is hopeful that stopping the prednisone will not cause symptoms to return, she says she is suppose to hear from her orthopedist about a procedure to treat her legs and she will call this CCM RN with the name of the procedure  Clinical Goal(s) related to Atrial Fibrillation, CHF, HTN, and COPD:  Over the next 30 days, patient will:  . Work with the care management team to address educational, disease management, and care coordination needs  . Begin or continue self health monitoring activities as directed today Measure and record blood pressure 5-7 times per week and assess swelling in lower extremities daily, check O2 sats when short of breath  . Call provider office for new or worsened signs and symptoms Blood pressure findings outside established parameters, breathing outside of baseline,  Shortness of breath, and New or worsened symptom related to CHF and COPD . Call care management team with questions or concerns . Verbalize basic understanding of patient centered plan of care established  today  Interventions related to Atrial Fibrillation, CHF, HTN, and COPD:  . Appropriate assessments completed . Reviewed medications with patient and assessed medication taking behavior;  if indicated, discussed importance of medication adherence  . Reviewed upcoming appointments and ensured she has transportation . Discussed plans with patient for ongoing care management follow up and ensured patient has contact number for CCM team and clinic . 05/17/20- Left message with [place] requesting return call form patient's Medicaid case worker . 05/21/20- Left a 2nd message with [place] in response to voice message left for this CCM RN on 05/19/20 requesting return call from patient's Medicaid case worker . 05/21/20 Sent community message to [employer] representative re: patient's call to clinic on 05/20/20 requesting portable O2 concentrator. Received the following response from Skeet Latch with [employer]- "We delivered the patient a concentrator and homefill station on 05/20/20. The patient signed the delivery ticket at 12:33pm. The patient is also in collections because she has a high overdue balance. " . 05/25/20- spoke with patient to inform her this CCM RN spoke with Lonn Georgia at [facility] and per Kayla's direction, provided patient with website address ( [email or web address]) to apply for Medicaid on line  . 05/25/20 6:11 pm patient left voice message stating the orthopedist wants to use hyaluronic acid injections to treat her osteoarthritis pain, procedure number [id number]. She is requesting educational information on the procedure.  Patient Self Care Activities related to Atrial Fibrillation, CHF, HTN, and COPD:  .  Patient is unable to independently self-manage chronic health conditions     Patient Care Plan: CCM RN- Chronic pain- Adult    Problem Identified: Chronic Pain Management  (Chronic Pain)     Long-Range Goal: Chronic Bilateral Knee Pain Managed   Start Date: 06/07/2020  Expected End Date: 09/30/2020  This Visit's Progress: On track  Priority: High  Note:   Current Barriers:  [name] Knowledge Deficits related to managing acute/chronic pain- patient states her orthopedic MD Dr Berenice Primas wants to treat her knee pain with hyaluronic acid injections and she has questions about how it works and the treatment's efficacy . Unable to independently manage chronic pain Nurse Case Manager Clinical Goal(s):  . patient will verbalize understanding of plan for managing pain . patient will attend all scheduled medical appointments: for knee injections . patient will report pain at a level less than 3 to 4 on a 10-10 rating scale . patient will use pharmacological and nonpharmacological pain relief strategies . Patient will verbalize good understanding of hyaluronic knee injections . patient will verbalize acceptable level of pain relief and ability to engage in desired activities . patient will engage in desired activities without an increase in pain level Interventions:  . Collaboration with Andrew Au, MD regarding development and update of comprehensive plan of care as evidenced by provider attestation and co-signature . Inter-disciplinary care team collaboration (see longitudinal plan of care) . - effectiveness of pharmacologic therapy monitored . - pain assessed . - pain treatment goals reviewed . Evaluation of current treatment plan related to bilateral chronic knee pain and patient's adherence to plan as established by provider . Provided education to patient re: Emmi article on viscosupplementation . Reviewed research on procedure and efficacy reports . Discussed plans with patient for ongoing care management follow up and provided patient with direct contact information for care management team Patient Goals/Self Care Activities:   . Self-administers medications as  prescribed . Attends all scheduled provider appointments including hyaluronic acid knee injection appointments  . Calls pharmacy for medication refills . Calls provider office for new concerns or questions Follow Up Plan: The care management team will reach out to the patient again over the next 30-60 days.        The patient verbalized understanding of instructions, educational materials, and care plan provided today and declined offer to receive copy of patient instructions, educational materials, and care plan.     Kelli Churn RN, CCM, [facility] RN Care Manager [phone]

## 2020-06-08 DIAGNOSIS — M7989 Other specified soft tissue disorders: Secondary | ICD-10-CM | POA: Diagnosis not present

## 2020-06-08 DIAGNOSIS — Z79899 Other long term (current) drug therapy: Secondary | ICD-10-CM | POA: Diagnosis not present

## 2020-06-08 DIAGNOSIS — M170 Bilateral primary osteoarthritis of knee: Secondary | ICD-10-CM | POA: Diagnosis not present

## 2020-06-08 DIAGNOSIS — F17210 Nicotine dependence, cigarettes, uncomplicated: Secondary | ICD-10-CM | POA: Diagnosis not present

## 2020-06-08 DIAGNOSIS — F17200 Nicotine dependence, unspecified, uncomplicated: Secondary | ICD-10-CM | POA: Diagnosis not present

## 2020-06-10 NOTE — Progress Notes (Signed)
Internal Medicine Clinic Resident  I have personally reviewed this encounter including the documentation in this note and/or discussed this patient with the care management provider. I will address any urgent items identified by the care management provider and will communicate my actions to the patient's PCP. I have reviewed the patient's CCM visit with my supervising attending, Dr Dareen Piano.  Mitzi Hansen, MD Internal Medicine Resident PGY-2 [name] Pager: [phone] 06/10/2020 4:01 PM

## 2020-06-14 NOTE — Progress Notes (Signed)
Internal Medicine Clinic Attending  CCM services provided by the care management provider and their documentation were discussed with Dr. Christian. We reviewed the pertinent findings, urgent action items addressed by the resident and non-urgent items to be addressed by the PCP.  I agree with the assessment, diagnosis, and plan of care documented in the CCM and resident's note.  Oluwadamilola Rosamond, MD 06/14/2020  

## 2020-06-15 NOTE — Progress Notes (Signed)
Please let patient know CT imaging showed no significant change of ILD. Recommend continue to remain off amiodarone. Additional findings including cardiomegaly, coronary artery disease, enlarged pulmonary artery and dilated thoracic aorta. Follow up with cardiology.

## 2020-06-16 ENCOUNTER — Telehealth: Payer: Self-pay | Admitting: Pulmonary Disease

## 2020-06-16 NOTE — Telephone Encounter (Signed)
Martyn Ehrich, NP  06/15/2020 9:30 AM EDT      Please let patient know CT imaging showed no significant change of ILD. Recommend continue to remain off amiodarone. Additional findings including cardiomegaly, coronary artery disease, enlarged pulmonary artery and dilated thoracic aorta. Follow up with cardiology.   ATC patient. She answered the phone but stated that she would be right back. Never came back to the phone. Will attempt to call back later.

## 2020-06-17 DIAGNOSIS — J449 Chronic obstructive pulmonary disease, unspecified: Secondary | ICD-10-CM | POA: Diagnosis not present

## 2020-06-17 NOTE — Telephone Encounter (Signed)
Call returned to patient, confirmed DOB. Made aware of CT results. Aware to f/u with cardiology. Copy of CT routed to Cardiologist Quay Burow.   While on phone patient reports she is still very SOB. She reports she is using her advair and spiriva daily. Denies cough. She uses 3L of oxygen at all times. Patient reports she has not been using cpap. Denies fever. She was under the impression she does not have to use her cpap at night since she has oxygen. I made her aware her oxygen does not replace her cpap. She should be using her cpap every night. Patient states every since she completed her prednisone her breathing has not been the same. She is wanting to know if she should take more prednisone or if she needs to be on it long term. I made her aware we probably need to go ahead and set her up for a tele visit since she seems to be confused about her oxygen and cpap and we can address all this at the visit. Patient prefers that I ask provider recommendations first.   BI please advise. Thanks

## 2020-06-17 NOTE — Telephone Encounter (Signed)
That is fine to switch to a televisit.  Please check to see if patient would be okay to see Volanda Napoleon NP as she has seen her previously and would be better known to [facility] NP . Volanda Napoleon NP has openings tomorrow .

## 2020-06-17 NOTE — Telephone Encounter (Signed)
ATC LVMTCB  

## 2020-06-17 NOTE — Telephone Encounter (Signed)
She definitely needs to be on CPAP for sleep apnea and oxygen blended in. Oxygen alone does not treat sleep apnea. Using too much steroids can cause weight gain and fluid retention which may be her main issue causing breathing trouble So would need office visit before we authorize more steroids

## 2020-06-17 NOTE — Telephone Encounter (Signed)
Pt returning a phone call. Pt can be reached at [phone].

## 2020-06-17 NOTE — Telephone Encounter (Signed)
Tammy, please advise if you are okay with pt's appt being a televisit.

## 2020-06-17 NOTE — Telephone Encounter (Signed)
ATC patient, left VM for patient to return call regarding appointment change for tomorrow, 06/18/2020.

## 2020-06-17 NOTE — Telephone Encounter (Signed)
Pt returning ap hone call. Pt stated that she is having a hard time breathing. Set pt an appt with TP on 3/18 but pt does not have transportation and to request transportation she would need a 3 day notice. Pt would like appt switched to a televisit if possible. Please advise.  Pt can be reached at [phone].

## 2020-06-18 ENCOUNTER — Encounter: Payer: Self-pay | Admitting: Primary Care

## 2020-06-18 ENCOUNTER — Other Ambulatory Visit: Payer: Self-pay

## 2020-06-18 ENCOUNTER — Other Ambulatory Visit: Payer: Self-pay | Admitting: *Deleted

## 2020-06-18 ENCOUNTER — Ambulatory Visit (INDEPENDENT_AMBULATORY_CARE_PROVIDER_SITE_OTHER): Payer: Medicare Other | Admitting: Primary Care

## 2020-06-18 ENCOUNTER — Ambulatory Visit: Payer: Medicare Other | Admitting: Adult Health

## 2020-06-18 DIAGNOSIS — J849 Interstitial pulmonary disease, unspecified: Secondary | ICD-10-CM

## 2020-06-18 DIAGNOSIS — J9611 Chronic respiratory failure with hypoxia: Secondary | ICD-10-CM | POA: Diagnosis not present

## 2020-06-18 DIAGNOSIS — J441 Chronic obstructive pulmonary disease with (acute) exacerbation: Secondary | ICD-10-CM | POA: Diagnosis not present

## 2020-06-18 DIAGNOSIS — G4733 Obstructive sleep apnea (adult) (pediatric): Secondary | ICD-10-CM

## 2020-06-18 MED ORDER — FLUTICASONE-SALMETEROL 250-50 MCG/DOSE IN AEPB: 1.0000 | INHALATION_SPRAY | Freq: Two times a day (BID) | RESPIRATORY_TRACT | 11 refills | Status: DC

## 2020-06-18 MED ORDER — IPRATROPIUM-ALBUTEROL 0.5-2.5 (3) MG/3ML IN SOLN: 3.0000 mL | Freq: Four times a day (QID) | RESPIRATORY_TRACT | 3 refills | Status: DC | PRN

## 2020-06-18 MED ORDER — PREDNISONE 10 MG PO TABS: ORAL_TABLET | ORAL | 0 refills | Status: DC

## 2020-06-18 NOTE — Patient Instructions (Signed)
Resume advair 1 puff twice daily Continue Spiriva handihaler Use duoneb every 6 hours as needed for shortness of breath Follow up scheduled for May 3rd with Dr. Elsworth Soho to review CPAP/OSA

## 2020-06-18 NOTE — Telephone Encounter (Signed)
Pt had televisit today 06/18/20 with Beth. Nothing further needed.

## 2020-06-18 NOTE — Progress Notes (Signed)
Virtual Visit via Telephone Note  I connected with Sharon Vasquez on 06/18/20 at  9:00 AM EDT by telephone and verified that I am speaking with the correct person using two identifiers.  Location: Patient: Home Provider: Office    I discussed the limitations, risks, security and privacy concerns of performing an evaluation and management service by telephone and the availability of in person appointments. I also discussed with the patient that there may be a patient responsible charge related to this service. The patient expressed understanding and agreed to proceed.   History of Present Illness:  70 year old female, current some day smoker.  Past medical history significant for COPD, RB-ILD, chronic respiratory failure with hypoxia, OSA, hypertension, A. fib, chronic anticoagulation, diastolic heart failure, aortic arthrosclerosis GERD, osteoporosis, chronic pain syndrome, obesity. [name].  Maintained on CPAP.  Amiodarone discontinued.  Smoking cessation encouraged.  Hospital course 04/10/2020-04/15/2020 Patient was admitted for acute hypoxic respiratory failure due to COPD exacerbation and respiratory bronchiolitis associated ILD.  Oxygen saturation was 70% upon EMS arrival.  She was started on BiPAP in ED.  She had severe diffuse wheezing.  She was treated for acute COPD exacerbation with 5 days of azithromycin and steroids.  She was transitioned to nasal cannula 12 L which was gradually weaned down.  Discharged on home oxygen.  05/13/2020 Patient presents today for hospital follow-up.  Patient was admitted from 04/10/2020-04/15/2020 at [facility] for COPD exacerbation. She still gets out of breath with minimal exertion. Associated wheezing. She is complaint with Advair 250/50 and Spiriva handihaler. She has not been set up with home oxygen, needs concentrator.  Using CPAP fairly regularly. Has full face mask. States that she has not smoked in 1 week but still taking "puffs" of  cigarettes. She had virtual visit with cardiology yesterday, no changes. She is off amiodarone. She is taking Torsemide 20mg  twice a day. Current weight 384lbs   [facility] download 04/12/20-05/11/20: 25/30 days (83%); 21 days (70%) > 4 hours. Average usage days used 8 hours 31 mins. Pressure 13 cm h20. Airleaks 11.0 median (102.4L/min). AHI 10.8   06/18/2020 - Interim hx  Patient contacted today for acute televisit. History significant for COPD, RB-ILD and OSA. Reports increased shortness of breath x 1 week. Associated nasal congestion and sneezing. No significant cough. She reports compliance with Spiriva handihaler. Using 2-3L oxygen 24/7. She is on Xarelto for afib. She ran out of Advair several days ago, needs refill. She has not been compliant with her CPAP. Wearing her oxygen at night only. We will place an order for DME company to help her sort this out.    Observations/Objective:  - Able to speak in full sentences  Assessment and Plan:  COPD exacerbation  - Increased shortness of breath x1 week, ran out of her Advair diskus. Recommend patient resume Advair 250-71mcg 1 puff twice daily, Spiriva Handihalier and duoneb q6 hours prn sob/wheezing. Refills provided along with RX for prednisone 20mg  x 5 days. Patient to notify our office if shortness of breath does not improve after resuming inhaler and completing short course of oral steriod  OSA: - Patient is 20% compliant with CPAP > 4 hours. Average usage 2 hours.  - Pressure 13cm h20, AHI 38.2 - DME order placed to help patient blend oxygen in CPAP  Follow Up Instructions:  - May 3rd with Dr. Elsworth Soho    I discussed the assessment and treatment plan with the patient. The patient was provided an opportunity to ask  questions and all were answered. The patient agreed with the plan and demonstrated an understanding of the instructions.   The patient was advised to call back or seek an in-person evaluation if the symptoms worsen or if the condition  fails to improve as anticipated.  I provided 24 minutes of non-face-to-face time during this encounter.   Martyn Ehrich, NP

## 2020-06-18 NOTE — Progress Notes (Deleted)
Attempted to call pt at 9am to do the televisit but unable to reach. Left message for pt to return call. Will try to call again.

## 2020-06-23 ENCOUNTER — Telehealth: Payer: Self-pay

## 2020-06-23 NOTE — Telephone Encounter (Signed)
Is requesting a call back. About her medication and wants to know if she does not take it is it ok ( he blood thinner )  She stated she is completely out and cant get any more  4/16 and she has been out for five days already

## 2020-06-23 NOTE — Telephone Encounter (Signed)
TC to patient, states she is talking to the other nurse on her other line. SChaplin, RN,BSN

## 2020-06-23 NOTE — Telephone Encounter (Signed)
Patient handed samples of Xarelto by Dr. Gilford Rile. Directions given verbally and written on bag. Patient states understanding.

## 2020-06-23 NOTE — Telephone Encounter (Signed)
Returned call to patient. States she was given 3 bottles of Xarelto but she cannot find the third bottle. She has been out now x 5 days. Earliest she can get Xarelto at pharmacy is 07/17/2020. Call placed to [place] at [facility]. Confirmed patient picked up 3 bottles on 05/12/2020 and earliest fill date of 07/17/2020. Patient will need 24 tabs to get her to that date. Have 1 bottle with 7 tabs of Xarelto 20 mg and 5 bottles with 7 tabs each of Xarelto 10 mg samples to give to patient. This was OK'd by Building surveyor. Call placed to patient. No answer. Left detailed message on patient's VM with this information.

## 2020-06-29 ENCOUNTER — Ambulatory Visit: Payer: Medicare Other | Admitting: *Deleted

## 2020-06-29 DIAGNOSIS — I10 Essential (primary) hypertension: Secondary | ICD-10-CM

## 2020-06-29 DIAGNOSIS — G894 Chronic pain syndrome: Secondary | ICD-10-CM

## 2020-06-29 DIAGNOSIS — I4891 Unspecified atrial fibrillation: Secondary | ICD-10-CM

## 2020-06-29 DIAGNOSIS — J9611 Chronic respiratory failure with hypoxia: Secondary | ICD-10-CM

## 2020-06-29 NOTE — Chronic Care Management (AMB) (Signed)
   06/29/2020  Sharon Vasquez Aug 17, 1950 [id number]  Patient left voice message today at 5:15 pm for this CCM RN  stating she went to the dentist today and was told she needs tooth extraction but that it is not safe to perform the extraction in an outpatient setting due to the patient's numerous  comorbidities. She was instructed by her dentist to find an oral surgeon affiliated with the hospital to perform the extractions. She is requesting assistance in finding an oral surgeon that accepts her insurance and has hospital privileges.   Plan:  Will make referral to the community care guide to provide oral surgeon resources with hospital privileges that accepts patient's health insurance.  Kelli Churn RN, CCM, [facility] RN Care Manager [phone]

## 2020-07-05 ENCOUNTER — Telehealth: Payer: Self-pay

## 2020-07-05 NOTE — Telephone Encounter (Signed)
   Telephone encounter was:  Successful.  07/05/2020 Name: Sharon Vasquez [name]: [id number] DOB: 12-20-1950  Sharon Vasquez is a 70 y.o. year old female who is a primary care patient of [name], [name], MD . The community resource team was consulted for assistance with Spoke with patient she has contacted [employer] and they could not tell her what oral surgeons have an affiliation with [facility]. I told  the patient I would research it.  Care guide performed the following interventions: Patient provided with information about care guide support team and interviewed to confirm resource needs.  Follow Up Plan:  Care guide will follow up with patient by phone over the next 7 days  Vidit Boissonneault, AAS Paralegal, [facility] . Embedded Care Coordination [facility]  Care Management  [address], [place] [address] [email or web address]  ?? [phone]   [email or web address]

## 2020-07-06 ENCOUNTER — Ambulatory Visit: Payer: Medicare Other | Admitting: *Deleted

## 2020-07-06 DIAGNOSIS — G894 Chronic pain syndrome: Secondary | ICD-10-CM

## 2020-07-06 DIAGNOSIS — I739 Peripheral vascular disease, unspecified: Secondary | ICD-10-CM

## 2020-07-06 DIAGNOSIS — E782 Mixed hyperlipidemia: Secondary | ICD-10-CM

## 2020-07-06 DIAGNOSIS — I4891 Unspecified atrial fibrillation: Secondary | ICD-10-CM

## 2020-07-06 DIAGNOSIS — I10 Essential (primary) hypertension: Secondary | ICD-10-CM

## 2020-07-06 DIAGNOSIS — M1712 Unilateral primary osteoarthritis, left knee: Secondary | ICD-10-CM | POA: Diagnosis not present

## 2020-07-06 DIAGNOSIS — J9611 Chronic respiratory failure with hypoxia: Secondary | ICD-10-CM

## 2020-07-06 DIAGNOSIS — M1711 Unilateral primary osteoarthritis, right knee: Secondary | ICD-10-CM | POA: Diagnosis not present

## 2020-07-06 NOTE — Chronic Care Management (AMB) (Signed)
Care Management    RN Visit Note  07/06/2020 Name: Sharon Vasquez [name]: [id number] DOB: 30-Jul-1950  Subjective: Sharon Vasquez is a 70 y.o. year old female who is a primary care patient of [name], [name], MD. The care management team was consulted for assistance with disease management and care coordination needs.    Engaged with patient by telephone for follow up visit in response to provider referral for case management and/or care coordination services.   Consent to Services:   Sharon Vasquez was given information about Care Management services today including:  1. Care Management services includes personalized support from designated clinical staff supervised by her physician, including individualized plan of care and coordination with other care providers 2. 24/7 contact phone numbers for assistance for urgent and routine care needs. 3. The patient may stop case management services at any time by phone call to the office staff.  Patient agreed to services and consent obtained.   Assessment: Review of patient past medical history, allergies, medications, health status, including review of consultants reports, laboratory and other test data, was performed as part of comprehensive evaluation and provision of chronic care management services.   SDOH (Social Determinants of Health) assessments and interventions performed:    Care Plan  No Known Allergies  Outpatient Encounter Medications as of 07/06/2020  Medication Sig  . aspirin EC 81 MG tablet Take 81 mg by mouth daily.  [name] atorvastatin (LIPITOR) 40 MG tablet Take 1 tablet (40 mg total) by mouth daily.  . benazepril (LOTENSIN) 40 MG tablet Take 0.5 tablets (20 mg total) by mouth daily.  [name] esomeprazole (NEXIUM) 40 MG capsule Take 1 capsule (40 mg total) by mouth daily as needed (for heartburn or indigestion). (Patient taking differently: Take 40 mg by mouth daily.)  . Fluticasone-Salmeterol (ADVAIR) 250-50 MCG/DOSE AEPB Inhale 1 puff into  the lungs 2 (two) times daily.  [name] gabapentin (NEURONTIN) 400 MG capsule TAKE 1 CAPSULE BY MOUTH  TWICE DAILY (Patient taking differently: Take 400 mg by mouth 2 (two) times daily.)  . ibuprofen (ADVIL) 200 MG tablet Take 800 mg by mouth every 6 (six) hours as needed (inflammation). (Patient not taking: Reported on 06/18/2020)  . ipratropium-albuterol (DUONEB) 0.5-2.5 (3) MG/3ML SOLN Take 3 mLs by nebulization every 6 (six) hours as needed.  . metoprolol succinate (TOPROL-XL) 50 MG 24 hr tablet TAKE 1 TABLET(50 MG) BY MOUTH DAILY WITH OR IMMEDIATELY FOLLOWING A MEAL  . mirabegron ER (MYRBETRIQ) 50 MG TB24 tablet Take 1 tablet (50 mg total) by mouth daily.  . potassium chloride (KLOR-CON) 10 MEQ tablet Take 1 tablet (10 mEq total) by mouth 2 (two) times daily.  . predniSONE (DELTASONE) 10 MG tablet Take 2 tabs x 5 days  . PRESCRIPTION MEDICATION Inhale into the lungs at bedtime. cpap  . rivaroxaban (XARELTO) 20 MG TABS tablet Take 1 tablet (20 mg total) by mouth every morning.  . tiotropium (SPIRIVA HANDIHALER) 18 MCG inhalation capsule PLACE 1 CAPSULE INTO INHALER AND INHALE DAILY (Patient taking differently: Place 18 mcg into inhaler and inhale daily.)  . torsemide (DEMADEX) 20 MG tablet TAKE 2 TABLETS BY MOUTH DAILY (Patient taking differently: Take 20 mg by mouth 2 (two) times daily.)   No facility-administered encounter medications on file as of 07/06/2020.    Patient Active Problem List   Diagnosis Date Noted  . Hyperlipidemia 05/14/2020  . Acute respiratory failure with hypoxia ([place]) 04/10/2020  . Hypertensive urgency 04/10/2020  . Swallowing difficulty 11/10/2019  .  Chronic respiratory failure with hypoxia ([place]) 09/30/2019  . Mixed incontinence, urge and stress (female) (female) 08/21/2019  . Overgrown toenails 07/01/2019  . Preventative health care 06/04/2019  . GERD (gastroesophageal reflux disease) 01/20/2019  . Coronary artery calcification seen on CT scan 04/12/2018  . ILD  (interstitial lung disease) ([place]) 02/13/2018  . Pain and numbness of right upper extremity 02/08/2018  . Long term current use of amiodarone 12/19/2017  . OSA (obstructive sleep apnea) 11/14/2017  . Chronic pain syndrome 07/27/2017  . Major depression, recurrent, chronic ([place]) 02/01/2017  . Osteoporosis 12/14/2016  . Drug induced constipation 08/21/2016  . Elevated blood sugar 07/19/2016  . Aortic atherosclerosis ([place]) 07/17/2016  . Chronic venous insufficiency 07/12/2016  . Chronic anticoagulation 07/12/2016  . Tobacco use 07/11/2016  . Acute exacerbation of chronic obstructive pulmonary disease (COPD) ([place])   . Diastolic heart failure ([place])   . History of pulmonary embolism   . Atrial fibrillation with controlled ventricular response ([place])   . Vocal cord polyp 12/18/2015  . Laryngopharyngeal reflux 12/18/2015  . Morbid obesity ([place])   . Peripheral vascular disease ([place])   . Benign essential HTN   . Primary osteoarthritis of both knees 09/16/2014    Conditions to be addressed/monitored:  HF, a fib, HTN, HLD, hx of PE and DVT, PVD, COPD, ILD, OSA, GERD, OA, anxiety/depression, chronic pain syndrome, incontinence, morbid obesit  Care Plan : CCM RN- COPD (Adult)  Updates made by Barrington Ellison, RN since 07/06/2020 12:00 AM    Problem: Symptom Exacerbation (COPD)     Goal: Symptom Exacerbation Prevented or Minimized   Start Date: 06/24/2019  Expected End Date: 12/24/2020  Recent Progress: On track  Priority: High  Note:   CARE PLAN ENTRY (see longitudinal plan of care for additional care plan information)  Current Barriers:  . Chronic Disease Management support, education, and care coordination needs related to Atrial Fibrillation, CHF, HTN, and COPD- spoke with patient via phone to complete follow up assessment, states she is doing well from COPD perspective,  patient says she is "struggling" to be more active per Dr Lianne Moris advice, she says she since stopping the prednisone her  legs are bothering her again  Clinical Goal(s) related to Atrial Fibrillation, CHF, HTN, and COPD:  Over the next 30 days, patient will:  . Work with the care management team to address educational, disease management, and care coordination needs  . Begin or continue self health monitoring activities as directed today Measure and record blood pressure 5-7 times per week and assess swelling in lower extremities daily, check O2 sats when short of breath  . Call provider office for new or worsened signs and symptoms Blood pressure findings outside established parameters, breathing outside of baseline,  Shortness of breath, and New or worsened symptom related to CHF and COPD . Call care management team with questions or concerns . Verbalize basic understanding of patient centered plan of care established today  Interventions related to Atrial Fibrillation, CHF, HTN, and COPD:  . Appropriate assessments completed . Reviewed medications with patient and assessed medication taking behavior;  if indicated, discussed importance of medication adherence  . Reviewed upcoming appointment with Dr Elsworth Soho on 08/03/20 and ensured she has transportation . Discussed plans with patient for ongoing care management follow up and ensured patient has contact number for CCM team and clinic  Patient Self Care Activities related to Atrial Fibrillation, CHF, HTN, and COPD:  . Patient is unable to independently self-manage chronic health conditions  Care Plan : CCM RN- Chronic pain- Adult  Updates made by Barrington Ellison, RN since 07/06/2020 12:00 AM    Problem: Chronic Pain Management (Chronic Pain)     Long-Range Goal: Chronic Bilateral Knee Pain Managed   Start Date: 06/07/2020  Expected End Date: 09/30/2020  Recent Progress: On track  Priority: High  Note:   Current Barriers:  [name] Knowledge Deficits related to managing acute/chronic pain- patient states she saw her orthopedic MD Dr Berenice Primas today and she had  hyaluronic acid injections in both knees. Says he cannot really tell a difference in the pain yet . Unable to independently manage chronic pain Nurse Case Manager Clinical Goal(s):  . patient will verbalize understanding of plan for managing pain . patient will attend all scheduled medical appointments: for knee injections . patient will report pain at a level less than 3 to 4 on a 10-10 rating scale . patient will use pharmacological and nonpharmacological pain relief strategies . Patient will verbalize good understanding of hyaluronic knee injections . patient will verbalize acceptable level of pain relief and ability to engage in desired activities . patient will engage in desired activities without an increase in pain level Interventions:  . Collaboration with Andrew Au, MD regarding development and update of comprehensive plan of care as evidenced by provider attestation and co-signature . Inter-disciplinary care team collaboration (see longitudinal plan of care) . - effectiveness of pharmacologic therapy monitored . - pain assessed . - pain treatment goals reviewed . Evaluation of current treatment plan related to bilateral chronic knee pain and patient's adherence to plan as established by provider . Assessed status of viscosupplementation . Discussed plans with patient for ongoing care management follow up and provided patient with direct contact information for care management team Patient Goals/Self Care Activities:   . Self-administers medications as prescribed . Attends all scheduled provider appointments including hyaluronic acid knee injection appointments  . Calls pharmacy for medication refills . Calls provider office for new concerns or questions Follow Up Plan: The care management team will reach out to the patient again over the next 30-60 days.        Kelli Churn RN, CCM, [facility] RN Care Manager [phone]

## 2020-07-06 NOTE — Patient Instructions (Signed)
Visit Information It was nice speaking with you today. Goals Addressed            This Visit's Progress   . Make and Keep All Appointments       Timeframe:  Long-Range Goal Priority:  High Start Date:            05/10/20                 Expected End Date:    11/07/20                   Follow Up Date 08/02/20   - arrange a ride through an agency 1 week before appointment - ask family or friend for a ride - call to cancel if needed - keep a calendar with prescription refill dates - keep a calendar with appointment dates - learn the bus route - use public transportation    Why is this important?    Part of staying healthy is seeing the doctor for follow-up care.   If you forget your appointments, there are some things you can do to stay on track.    Notes: 07/06/20 meeting goal    . Track and Manage Fluids and Swelling-Heart Failure       Timeframe:  Long-Range Goal Priority:  High Start Date:     06/24/19                        Expected End Date:    12/24/20                   Follow Up Date 08/03/20   - keep legs up while sitting - use salt in moderation - watch for swelling in feet, ankles and legs every day    Why is this important?    It is important to check your weight daily and watch how much salt and liquids you have.   It will help you to manage your heart failure.    Notes: 07/06/20 meeting goal    . Track and Manage My Blood Pressure-Hypertension       Timeframe:  Long-Range Goal Priority:  High Start Date:      06/24/19                       Expected End Date:      12/24/20                 Follow Up Date 08/02/20   - check blood pressure weekly    Why is this important?    You won't feel high blood pressure, but it can still hurt your blood vessels.   High blood pressure can cause heart or kidney problems. It can also cause a stroke.   Making lifestyle changes like losing a little weight or eating less salt will help.   Checking your blood pressure at home  and at different times of the day can help to control blood pressure.   If the doctor prescribes medicine remember to take it the way the doctor ordered.   Call the office if you cannot afford the medicine or if there are questions about it.     Notes: 07/06/20- meeting goal    . Track and Manage My Symptoms-COPD       Timeframe:  Long-Range Goal Priority:  High Start Date:      06/24/19  Expected End Date:      12/25/19                 Follow Up Date 08/02/20   - eliminate symptom triggers at home - keep follow-up appointments - use an extra pillow to sleep    Why is this important?    Tracking your symptoms and other information about your health helps your doctor plan your care.   Write down the symptoms, the time of day, what you were doing and what medicine you are taking.   You will soon learn how to manage your symptoms.     Notes: 07/06/20- meeting goal       The patient verbalized understanding of instructions, educational materials, and care plan provided today and declined offer to receive copy of patient instructions, educational materials, and care plan.   The care management team will reach out to the patient again over the next 30-60 days.   Kelli Churn RN, CCM, [facility] RN Care Manager [phone]

## 2020-07-09 ENCOUNTER — Telehealth: Payer: Self-pay

## 2020-07-09 DIAGNOSIS — M170 Bilateral primary osteoarthritis of knee: Secondary | ICD-10-CM | POA: Diagnosis not present

## 2020-07-09 DIAGNOSIS — F17210 Nicotine dependence, cigarettes, uncomplicated: Secondary | ICD-10-CM | POA: Diagnosis not present

## 2020-07-09 DIAGNOSIS — M7989 Other specified soft tissue disorders: Secondary | ICD-10-CM | POA: Diagnosis not present

## 2020-07-09 DIAGNOSIS — Z79899 Other long term (current) drug therapy: Secondary | ICD-10-CM | POA: Diagnosis not present

## 2020-07-09 DIAGNOSIS — F17200 Nicotine dependence, unspecified, uncomplicated: Secondary | ICD-10-CM | POA: Diagnosis not present

## 2020-07-09 NOTE — Telephone Encounter (Signed)
   Telephone encounter was:  Unsuccessful.  07/09/2020 Name: Sharon Vasquez [name]: [id number] DOB: 26-Mar-1951  Unsuccessful outbound call made today to assist with:  oral surgeon information  Outreach Attempt:  2nd Attempt  A HIPAA compliant voice message was left requesting a return call.  Instructed patient to call back at [phone].  Calden Dorsey, AAS Paralegal, [place] . Embedded Care Coordination [facility] Health  Care Management  [address], [place] [address] ??[email or web address]  ?? [phone]   [email or web address]

## 2020-07-13 ENCOUNTER — Other Ambulatory Visit: Payer: Self-pay | Admitting: Internal Medicine

## 2020-07-13 ENCOUNTER — Ambulatory Visit: Payer: Medicare Other | Admitting: *Deleted

## 2020-07-13 ENCOUNTER — Telehealth: Payer: Self-pay

## 2020-07-13 DIAGNOSIS — E782 Mixed hyperlipidemia: Secondary | ICD-10-CM

## 2020-07-13 DIAGNOSIS — I739 Peripheral vascular disease, unspecified: Secondary | ICD-10-CM

## 2020-07-13 DIAGNOSIS — I4891 Unspecified atrial fibrillation: Secondary | ICD-10-CM

## 2020-07-13 DIAGNOSIS — J9611 Chronic respiratory failure with hypoxia: Secondary | ICD-10-CM

## 2020-07-13 DIAGNOSIS — J449 Chronic obstructive pulmonary disease, unspecified: Secondary | ICD-10-CM

## 2020-07-13 DIAGNOSIS — I10 Essential (primary) hypertension: Secondary | ICD-10-CM

## 2020-07-13 DIAGNOSIS — M1712 Unilateral primary osteoarthritis, left knee: Secondary | ICD-10-CM | POA: Diagnosis not present

## 2020-07-13 DIAGNOSIS — R32 Unspecified urinary incontinence: Secondary | ICD-10-CM

## 2020-07-13 DIAGNOSIS — I5033 Acute on chronic diastolic (congestive) heart failure: Secondary | ICD-10-CM

## 2020-07-13 DIAGNOSIS — M1711 Unilateral primary osteoarthritis, right knee: Secondary | ICD-10-CM | POA: Diagnosis not present

## 2020-07-13 NOTE — Chronic Care Management (AMB) (Signed)
  Care Management   Note  07/13/2020 Name: [name] [name]: [id number] DOB: 09/22/1950  Sharon Vasquez is enrolled in a Managed Medicaid plan: No. Outreach attempt today was successful.    Patient  left message for this CCM RN at 12:15 pm stating her nebulizer machine no longer works and she needs a replacement ASAP.  She is requesting assistance from this CCM RN in securing order from provider and notifying [place].  Messaged provider with request for order; will notify [employer] once order is written.  The care management team will reach out to the patient again over the next 7 days.   Kelli Churn RN, CCM, [facility] RN Care Manager [phone]

## 2020-07-13 NOTE — Telephone Encounter (Signed)
   Telephone encounter was:  Successful.  07/13/2020 Name: Sharon Vasquez [name]: [id number] DOB: 1950/10/08  Sharon Vasquez is a 70 y.o. year old female who is a primary care patient of [name], [name], MD . The community resource team was consulted for assistance with Spoke with patient she has not checked her email to see if she received the list of [facility] affiliated oral surgeons. Verified her email address and it is correct. Patient stated she would call me once she checks her email.  Care guide performed the following interventions: Follow up call placed to the patient to discuss status of referral.  Follow Up Plan:  No further follow up planned at this time. The patient has been provided with needed resources.  Ralyn Stlaurent, AAS Paralegal, [place] . Embedded Care Coordination [facility] Health  Care Management  [address], [place] [address] ??[email or web address]  ?? [phone]   [email or web address]

## 2020-07-13 NOTE — Chronic Care Management (AMB) (Signed)
Care Management    RN Visit Note  07/13/2020 Name: Sharon Vasquez [name]: [id number] DOB: Apr 23, 1950  Subjective: Sharon Vasquez is a 70 y.o. year old female who is a primary care patient of Sharon Vasquez, Sharon Reading, MD. The care management team was consulted for assistance with disease management and care coordination needs.    Engaged with patient by telephone for follow up visit in response to provider referral for case management and/or care coordination services.   Consent to Services:   [name] was given information about Care Management services today including:  1. Care Management services includes personalized support from designated clinical staff supervised by her physician, including individualized plan of care and coordination with other care providers 2. 24/7 contact phone numbers for assistance for urgent and routine care needs. 3. The patient may stop case management services at any time by phone call to the office staff.  Patient agreed to services and consent obtained.   Assessment: Review of patient past medical history, allergies, medications, health status, including review of consultants reports, laboratory and other test data, was performed as part of comprehensive evaluation and provision of chronic care management services.   SDOH (Social Determinants of Health) assessments and interventions performed:    Care Plan  No Known Allergies  Outpatient Encounter Medications as of 07/13/2020  Medication Sig  . aspirin EC 81 MG tablet Take 81 mg by mouth daily.  [name] atorvastatin (LIPITOR) 40 MG tablet Take 1 tablet (40 mg total) by mouth daily.  . benazepril (LOTENSIN) 40 MG tablet Take 0.5 tablets (20 mg total) by mouth daily.  [name] esomeprazole (NEXIUM) 40 MG capsule Take 1 capsule (40 mg total) by mouth daily as needed (for heartburn or indigestion). (Patient taking differently: Take 40 mg by mouth daily.)  . Fluticasone-Salmeterol (ADVAIR) 250-50 MCG/DOSE AEPB Inhale 1 puff into  the lungs 2 (two) times daily.  [name] gabapentin (NEURONTIN) 400 MG capsule TAKE 1 CAPSULE BY MOUTH  TWICE DAILY (Patient taking differently: Take 400 mg by mouth 2 (two) times daily.)  . ibuprofen (ADVIL) 200 MG tablet Take 800 mg by mouth every 6 (six) hours as needed (inflammation). (Patient not taking: Reported on 06/18/2020)  . ipratropium-albuterol (DUONEB) 0.5-2.5 (3) MG/3ML SOLN Take 3 mLs by nebulization every 6 (six) hours as needed.  . metoprolol succinate (TOPROL-XL) 50 MG 24 hr tablet TAKE 1 TABLET(50 MG) BY MOUTH DAILY WITH OR IMMEDIATELY FOLLOWING A MEAL  . mirabegron ER (MYRBETRIQ) 50 MG TB24 tablet Take 1 tablet (50 mg total) by mouth daily.  . potassium chloride (KLOR-CON) 10 MEQ tablet Take 1 tablet (10 mEq total) by mouth 2 (two) times daily.  . predniSONE (DELTASONE) 10 MG tablet Take 2 tabs x 5 days  . PRESCRIPTION MEDICATION Inhale into the lungs at bedtime. cpap  . rivaroxaban (XARELTO) 20 MG TABS tablet Take 1 tablet (20 mg total) by mouth every morning.  . tiotropium (SPIRIVA HANDIHALER) 18 MCG inhalation capsule PLACE 1 CAPSULE INTO INHALER AND INHALE DAILY (Patient taking differently: Place 18 mcg into inhaler and inhale daily.)  . torsemide (DEMADEX) 20 MG tablet TAKE 2 TABLETS BY MOUTH DAILY (Patient taking differently: Take 20 mg by mouth 2 (two) times daily.)   No facility-administered encounter medications on file as of 07/13/2020.    Patient Active Problem List   Diagnosis Date Noted  . Hyperlipidemia 05/14/2020  . Acute respiratory failure with hypoxia ([place]) 04/10/2020  . Hypertensive urgency 04/10/2020  . Swallowing difficulty 11/10/2019  .  Chronic respiratory failure with hypoxia ([place]) 09/30/2019  . Mixed incontinence, urge and stress (female) (female) 08/21/2019  . Overgrown toenails 07/01/2019  . Preventative health care 06/04/2019  . GERD (gastroesophageal reflux disease) 01/20/2019  . Coronary artery calcification seen on CT scan 04/12/2018  . ILD  (interstitial lung disease) ([place]) 02/13/2018  . Pain and numbness of right upper extremity 02/08/2018  . Long term current use of amiodarone 12/19/2017  . OSA (obstructive sleep apnea) 11/14/2017  . Chronic pain syndrome 07/27/2017  . Major depression, recurrent, chronic ([place]) 02/01/2017  . Osteoporosis 12/14/2016  . Drug induced constipation 08/21/2016  . Elevated blood sugar 07/19/2016  . Aortic atherosclerosis ([place]) 07/17/2016  . Chronic venous insufficiency 07/12/2016  . Chronic anticoagulation 07/12/2016  . Tobacco use 07/11/2016  . Acute exacerbation of chronic obstructive pulmonary disease (COPD) ([place])   . Diastolic heart failure ([place])   . History of pulmonary embolism   . Atrial fibrillation with controlled ventricular response ([place])   . Vocal cord polyp 12/18/2015  . Laryngopharyngeal reflux 12/18/2015  . Morbid obesity ([place])   . Peripheral vascular disease ([place])   . Benign essential HTN   . Primary osteoarthritis of both knees 09/16/2014    Conditions to be addressed/monitored: HF, a fib, HTN, HLD, hx of PE and DVT, PVD, COPD, ILD, OSA, GERD, OA, anxiety/depression, chronic pain syndrome, incontinence, morbid obesity   Care Plan : CCM RN- COPD (Adult)  Updates made by Barrington Ellison, RN since 07/13/2020 12:00 AM    Problem: Symptom Exacerbation (COPD)     Goal: Symptom Exacerbation Prevented or Minimized   Start Date: 06/24/2019  Expected End Date: 12/24/2020  Recent Progress: On track  Priority: High  Note:   CARE PLAN ENTRY (see longitudinal plan of care for additional care plan information)  Current Barriers:  . Chronic Disease Management support, education, and care coordination needs related to Atrial Fibrillation, CHF, HTN, and COPD- spoke with patient via phone to complete follow up assessment, states she is doing well from COPD perspective,  patient says she is "struggling" to be more active per Dr Lianne Moris advice, she says she since stopping the prednisone her  legs are bothering her again, 4/12- received phone message from patient requesting assistance with getting a new nebulizer  Clinical Goal(s) related to Atrial Fibrillation, CHF, HTN, and COPD:  Over the next 30 days, patient will:  . Work with the care management team to address educational, disease management, and care coordination needs  . Begin or continue self health monitoring activities as directed today Measure and record blood pressure 5-7 times per week and assess swelling in lower extremities daily, check O2 sats when short of breath  . Call provider office for new or worsened signs and symptoms Blood pressure findings outside established parameters, breathing outside of baseline,  Shortness of breath, and New or worsened symptom related to CHF and COPD . Call care management team with questions or concerns . Verbalize basic understanding of patient centered plan of care established today  Interventions related to Atrial Fibrillation, CHF, HTN, and COPD:  . Appropriate assessments completed . Reviewed medications with patient and assessed medication taking behavior;  if indicated, discussed importance of medication adherence  . Reviewed upcoming appointment with Dr Elsworth Soho on 08/03/20 and ensured she has transportation . Discussed plans with patient for ongoing care management follow up and ensured patient has contact number for CCM team and clinic . 07/13/20- messaged provider requesting order for new nebulizer, tubing and mask.  Received message from provider that order is in [place]. Messaged [employer] representative Skeet Latch and she acknowledged receipt of order. Called patient to advise her that [employer] should be reaching out to her and also ensured she has [employer]'s contact number.   Patient Self Care Activities related to Atrial Fibrillation, CHF, HTN, and COPD:  . Patient is unable to independently self-manage chronic health conditions       Plan: The care management team will reach out to  the patient again over the next 30-60 days.  Kelli Churn RN, CCM, [facility] RN Care Manager [phone]

## 2020-07-14 DIAGNOSIS — J849 Interstitial pulmonary disease, unspecified: Secondary | ICD-10-CM | POA: Diagnosis not present

## 2020-07-18 DIAGNOSIS — J449 Chronic obstructive pulmonary disease, unspecified: Secondary | ICD-10-CM | POA: Diagnosis not present

## 2020-07-19 ENCOUNTER — Ambulatory Visit: Payer: Medicare Other | Admitting: *Deleted

## 2020-07-19 DIAGNOSIS — R32 Unspecified urinary incontinence: Secondary | ICD-10-CM

## 2020-07-19 DIAGNOSIS — E782 Mixed hyperlipidemia: Secondary | ICD-10-CM

## 2020-07-19 DIAGNOSIS — I5033 Acute on chronic diastolic (congestive) heart failure: Secondary | ICD-10-CM

## 2020-07-19 DIAGNOSIS — I739 Peripheral vascular disease, unspecified: Secondary | ICD-10-CM

## 2020-07-19 DIAGNOSIS — I10 Essential (primary) hypertension: Secondary | ICD-10-CM

## 2020-07-19 DIAGNOSIS — G894 Chronic pain syndrome: Secondary | ICD-10-CM

## 2020-07-19 DIAGNOSIS — I4891 Unspecified atrial fibrillation: Secondary | ICD-10-CM

## 2020-07-19 DIAGNOSIS — J9611 Chronic respiratory failure with hypoxia: Secondary | ICD-10-CM

## 2020-07-19 DIAGNOSIS — J449 Chronic obstructive pulmonary disease, unspecified: Secondary | ICD-10-CM

## 2020-07-19 NOTE — Chronic Care Management (AMB) (Signed)
Care Management    RN Visit Note  07/19/2020 Name: Sharon Vasquez [name]: [id number] DOB: 1950/10/25  Subjective: Sharon Vasquez is a 70 y.o. year old female who is a primary care patient of Sharon Vasquez, Sharon Reading, MD. The care management team was consulted for assistance with disease management and care coordination needs.     Consent to Services:   Sharon Vasquez was given information about Care Management services today including:  1. Care Management services includes personalized support from designated clinical staff supervised by her physician, including individualized plan of care and coordination with other care providers 2. 24/7 contact phone numbers for assistance for urgent and routine care needs. 3. The patient may stop case management services at any time by phone call to the office staff.  Patient agreed to services and consent obtained.   Assessment: Review of patient past medical history, allergies, medications, health status, including review of consultants reports, laboratory and other test data, was performed as part of comprehensive evaluation and provision of chronic care management services.   SDOH (Social Determinants of Health) assessments and interventions performed:    Care Plan  No Known Allergies  Outpatient Encounter Medications as of 07/19/2020  Medication Sig  . aspirin EC 81 MG tablet Take 81 mg by mouth daily.  [name] atorvastatin (LIPITOR) 40 MG tablet Take 1 tablet (40 mg total) by mouth daily.  . benazepril (LOTENSIN) 40 MG tablet Take 0.5 tablets (20 mg total) by mouth daily.  [name] esomeprazole (NEXIUM) 40 MG capsule Take 1 capsule (40 mg total) by mouth daily as needed (for heartburn or indigestion). (Patient taking differently: Take 40 mg by mouth daily.)  . Fluticasone-Salmeterol (ADVAIR) 250-50 MCG/DOSE AEPB Inhale 1 puff into the lungs 2 (two) times daily.  [name] gabapentin (NEURONTIN) 400 MG capsule TAKE 1 CAPSULE BY MOUTH  TWICE DAILY (Patient taking differently: Take  400 mg by mouth 2 (two) times daily.)  . ibuprofen (ADVIL) 200 MG tablet Take 800 mg by mouth every 6 (six) hours as needed (inflammation). (Patient not taking: Reported on 06/18/2020)  . ipratropium-albuterol (DUONEB) 0.5-2.5 (3) MG/3ML SOLN Take 3 mLs by nebulization every 6 (six) hours as needed.  . metoprolol succinate (TOPROL-XL) 50 MG 24 hr tablet TAKE 1 TABLET(50 MG) BY MOUTH DAILY WITH OR IMMEDIATELY FOLLOWING A MEAL  . mirabegron ER (MYRBETRIQ) 50 MG TB24 tablet Take 1 tablet (50 mg total) by mouth daily.  . potassium chloride (KLOR-CON) 10 MEQ tablet Take 1 tablet (10 mEq total) by mouth 2 (two) times daily.  . predniSONE (DELTASONE) 10 MG tablet Take 2 tabs x 5 days  . PRESCRIPTION MEDICATION Inhale into the lungs at bedtime. cpap  . rivaroxaban (XARELTO) 20 MG TABS tablet Take 1 tablet (20 mg total) by mouth every morning.  . tiotropium (SPIRIVA HANDIHALER) 18 MCG inhalation capsule PLACE 1 CAPSULE INTO INHALER AND INHALE DAILY (Patient taking differently: Place 18 mcg into inhaler and inhale daily.)  . torsemide (DEMADEX) 20 MG tablet TAKE 2 TABLETS BY MOUTH DAILY (Patient taking differently: Take 20 mg by mouth 2 (two) times daily.)   No facility-administered encounter medications on file as of 07/19/2020.    Patient Active Problem List   Diagnosis Date Noted  . Hyperlipidemia 05/14/2020  . Acute respiratory failure with hypoxia ([place]) 04/10/2020  . Hypertensive urgency 04/10/2020  . Swallowing difficulty 11/10/2019  . Chronic respiratory failure with hypoxia ([place]) 09/30/2019  . Mixed incontinence, urge and stress (female) (female) 08/21/2019  . Overgrown toenails  07/01/2019  . Preventative health care 06/04/2019  . GERD (gastroesophageal reflux disease) 01/20/2019  . Coronary artery calcification seen on CT scan 04/12/2018  . ILD (interstitial lung disease) ([place]) 02/13/2018  . Pain and numbness of right upper extremity 02/08/2018  . Long term current use of amiodarone  12/19/2017  . OSA (obstructive sleep apnea) 11/14/2017  . Chronic pain syndrome 07/27/2017  . Major depression, recurrent, chronic ([place]) 02/01/2017  . Osteoporosis 12/14/2016  . Drug induced constipation 08/21/2016  . Elevated blood sugar 07/19/2016  . Aortic atherosclerosis ([place]) 07/17/2016  . Chronic venous insufficiency 07/12/2016  . Chronic anticoagulation 07/12/2016  . Tobacco use 07/11/2016  . Acute exacerbation of chronic obstructive pulmonary disease (COPD) ([place])   . Diastolic heart failure ([place])   . History of pulmonary embolism   . Atrial fibrillation with controlled ventricular response ([place])   . Vocal cord polyp 12/18/2015  . Laryngopharyngeal reflux 12/18/2015  . Morbid obesity ([place])   . Peripheral vascular disease ([place])   . Benign essential HTN   . Primary osteoarthritis of both knees 09/16/2014    Conditions to be addressed/monitored: HF, a fib, HTN, HLD, hx of PE and DVT, PVD, COPD, ILD, OSA, GERD, OA, anxiety/depression, chronic pain syndrome, incontinence, morbid obesity   Care Plan : CCM RN- COPD (Adult)  Updates made by Barrington Ellison, RN since 07/19/2020 12:00 AM    Problem: Symptom Exacerbation (COPD)     Goal: Symptom Exacerbation Prevented or Minimized   Start Date: 06/24/2019  Expected End Date: 12/24/2020  Recent Progress: On track  Priority: High  Note:   CARE PLAN ENTRY (see longitudinal plan of care for additional care plan information)  Current Barriers:  . Chronic Disease Management support, education, and care coordination needs related to Atrial Fibrillation, CHF, HTN, and COPD- spoke with patient via phone to complete follow up assessment, states she is doing well from COPD perspective,  patient says she is "struggling" to be more active per Dr Lianne Moris advice, she says she since stopping the prednisone her legs are bothering her again, 4/12- received phone message from patient requesting assistance with getting a new nebulizer, 4/15- received  voice message from patient stating she received her new nebulizer and mask and tubing from [employer] and voiced appreciation to this CCM RN for assisting with securing new machine and supplies    Clinical Goal(s) related to Atrial Fibrillation, CHF, HTN, and COPD:  Over the next 30 days, patient will:  . Work with the care management team to address educational, disease management, and care coordination needs  . Begin or continue self health monitoring activities as directed today Measure and record blood pressure 5-7 times per week and assess swelling in lower extremities daily, check O2 sats when short of breath  . Call provider office for new or worsened signs and symptoms Blood pressure findings outside established parameters, breathing outside of baseline,  Shortness of breath, and New or worsened symptom related to CHF and COPD . Call care management team with questions or concerns . Verbalize basic understanding of patient centered plan of care established today  Interventions related to Atrial Fibrillation, CHF, HTN, and COPD:  . Appropriate assessments completed . Reviewed medications with patient and assessed medication taking behavior;  if indicated, discussed importance of medication adherence  . Reviewed upcoming appointment with Dr Elsworth Soho on 08/03/20 and ensured she has transportation . Discussed plans with patient for ongoing care management follow up and ensured patient has contact number for CCM team  and clinic . 07/13/20- messaged provider requesting order for new nebulizer, tubing and mask. Received message from provider that order is in [place]. Messaged [employer] representative Skeet Latch and she acknowledged receipt of order. Called patient to advise her that [employer] should be reaching out to her and also ensured she has [employer]'s contact number.   Patient Self Care Activities related to Atrial Fibrillation, CHF, HTN, and COPD:  . Patient is unable to independently self-manage chronic health  conditions       Plan: The care management team will reach out to the patient again over the next 30 days.  Kelli Churn RN, CCM, [facility] RN Care Manager [phone]

## 2020-07-20 DIAGNOSIS — M25562 Pain in left knee: Secondary | ICD-10-CM | POA: Diagnosis not present

## 2020-07-20 DIAGNOSIS — M25561 Pain in right knee: Secondary | ICD-10-CM | POA: Diagnosis not present

## 2020-07-20 DIAGNOSIS — M1712 Unilateral primary osteoarthritis, left knee: Secondary | ICD-10-CM | POA: Diagnosis not present

## 2020-07-20 DIAGNOSIS — M1711 Unilateral primary osteoarthritis, right knee: Secondary | ICD-10-CM | POA: Diagnosis not present

## 2020-07-24 ENCOUNTER — Emergency Department (HOSPITAL_COMMUNITY)
Admission: EM | Admit: 2020-07-24 | Discharge: 2020-07-24 | Disposition: A | Discharge status: Home / Self Care | Payer: Medicare Other | Attending: Emergency Medicine | Admitting: Emergency Medicine

## 2020-07-24 ENCOUNTER — Encounter (HOSPITAL_COMMUNITY): Payer: Self-pay

## 2020-07-24 ENCOUNTER — Other Ambulatory Visit: Payer: Self-pay

## 2020-07-24 ENCOUNTER — Emergency Department (HOSPITAL_COMMUNITY): Payer: Medicare Other

## 2020-07-24 DIAGNOSIS — R079 Chest pain, unspecified: Secondary | ICD-10-CM | POA: Diagnosis not present

## 2020-07-24 DIAGNOSIS — Z7901 Long term (current) use of anticoagulants: Secondary | ICD-10-CM | POA: Insufficient documentation

## 2020-07-24 DIAGNOSIS — R0789 Other chest pain: Secondary | ICD-10-CM | POA: Diagnosis not present

## 2020-07-24 DIAGNOSIS — I517 Cardiomegaly: Secondary | ICD-10-CM | POA: Diagnosis not present

## 2020-07-24 DIAGNOSIS — I509 Heart failure, unspecified: Secondary | ICD-10-CM | POA: Diagnosis not present

## 2020-07-24 DIAGNOSIS — R0602 Shortness of breath: Secondary | ICD-10-CM | POA: Diagnosis not present

## 2020-07-24 DIAGNOSIS — J9610 Chronic respiratory failure, unspecified whether with hypoxia or hypercapnia: Secondary | ICD-10-CM | POA: Diagnosis not present

## 2020-07-24 DIAGNOSIS — I499 Cardiac arrhythmia, unspecified: Secondary | ICD-10-CM | POA: Diagnosis not present

## 2020-07-24 DIAGNOSIS — I5033 Acute on chronic diastolic (congestive) heart failure: Secondary | ICD-10-CM | POA: Insufficient documentation

## 2020-07-24 DIAGNOSIS — Z743 Need for continuous supervision: Secondary | ICD-10-CM | POA: Diagnosis not present

## 2020-07-24 DIAGNOSIS — Z7982 Long term (current) use of aspirin: Secondary | ICD-10-CM | POA: Diagnosis not present

## 2020-07-24 DIAGNOSIS — I4891 Unspecified atrial fibrillation: Secondary | ICD-10-CM | POA: Insufficient documentation

## 2020-07-24 DIAGNOSIS — Z7951 Long term (current) use of inhaled steroids: Secondary | ICD-10-CM | POA: Insufficient documentation

## 2020-07-24 DIAGNOSIS — I110 Hypertensive heart disease with heart failure: Secondary | ICD-10-CM | POA: Diagnosis not present

## 2020-07-24 DIAGNOSIS — Z87891 Personal history of nicotine dependence: Secondary | ICD-10-CM | POA: Diagnosis not present

## 2020-07-24 DIAGNOSIS — Z79899 Other long term (current) drug therapy: Secondary | ICD-10-CM | POA: Diagnosis not present

## 2020-07-24 DIAGNOSIS — R600 Localized edema: Secondary | ICD-10-CM | POA: Insufficient documentation

## 2020-07-24 DIAGNOSIS — J441 Chronic obstructive pulmonary disease with (acute) exacerbation: Secondary | ICD-10-CM | POA: Insufficient documentation

## 2020-07-24 DIAGNOSIS — J811 Chronic pulmonary edema: Secondary | ICD-10-CM | POA: Diagnosis not present

## 2020-07-24 DIAGNOSIS — J449 Chronic obstructive pulmonary disease, unspecified: Secondary | ICD-10-CM

## 2020-07-24 LAB — CBC WITH DIFFERENTIAL/PLATELET
Abs Immature Granulocytes: 0.02 10*3/uL (ref 0.00–0.07)
Basophils Absolute: 0 10*3/uL (ref 0.0–0.1)
Basophils Relative: 0 %
Eosinophils Absolute: 0.1 10*3/uL (ref 0.0–0.5)
Eosinophils Relative: 2 %
HCT: 39 % (ref 36.0–46.0)
Hemoglobin: 12.8 g/dL (ref 12.0–15.0)
Immature Granulocytes: 0 %
Lymphocytes Relative: 31 %
Lymphs Abs: 1.8 10*3/uL (ref 0.7–4.0)
MCH: 28.8 pg (ref 26.0–34.0)
MCHC: 32.8 g/dL (ref 30.0–36.0)
MCV: 87.6 fL (ref 80.0–100.0)
Monocytes Absolute: 0.3 10*3/uL (ref 0.1–1.0)
Monocytes Relative: 6 %
Neutro Abs: 3.5 10*3/uL (ref 1.7–7.7)
Neutrophils Relative %: 61 %
Platelets: 394 10*3/uL (ref 150–400)
RBC: 4.45 MIL/uL (ref 3.87–5.11)
RDW: 13.2 % (ref 11.5–15.5)
WBC: 5.7 10*3/uL (ref 4.0–10.5)
nRBC: 0 % (ref 0.0–0.2)

## 2020-07-24 LAB — COMPREHENSIVE METABOLIC PANEL
ALT: 16 U/L (ref 0–44)
AST: 15 U/L (ref 15–41)
Albumin: 3.6 g/dL (ref 3.5–5.0)
Alkaline Phosphatase: 84 U/L (ref 38–126)
Anion gap: 9 (ref 5–15)
BUN: 11 mg/dL (ref 8–23)
CO2: 27 mmol/L (ref 22–32)
Calcium: 9.1 mg/dL (ref 8.9–10.3)
Chloride: 106 mmol/L (ref 98–111)
Creatinine, Ser: 0.95 mg/dL (ref 0.44–1.00)
GFR, Estimated: >60 mL/min (ref >60)
Glucose, Bld: 136 mg/dL — ABNORMAL HIGH (ref 70–99)
Potassium: 3.7 mmol/L (ref 3.5–5.1)
Sodium: 142 mmol/L (ref 135–145)
Total Bilirubin: 0.2 mg/dL — ABNORMAL LOW (ref 0.3–1.2)
Total Protein: 7 g/dL (ref 6.5–8.1)

## 2020-07-24 LAB — BRAIN NATRIURETIC PEPTIDE: B Natriuretic Peptide: 128.1 pg/mL — ABNORMAL HIGH (ref 0.0–100.0)

## 2020-07-24 LAB — TROPONIN I (HIGH SENSITIVITY)
Troponin I (High Sensitivity): 5 ng/L (ref <18)
Troponin I (High Sensitivity): 6 ng/L (ref <18)

## 2020-07-24 MED ORDER — PREDNISONE 20 MG PO TABS: 40.0000 mg | ORAL_TABLET | Freq: Every day | ORAL | 0 refills | Status: DC

## 2020-07-24 MED ORDER — PREDNISONE 20 MG PO TABS
40.0000 mg | ORAL_TABLET | Freq: Once | ORAL | Status: AC
  Administered 2020-07-24: 40 mg via ORAL
  Filled 2020-07-24: qty 2

## 2020-07-24 MED ORDER — FUROSEMIDE 10 MG/ML IJ SOLN
40.0000 mg | INTRAMUSCULAR | Status: AC
  Administered 2020-07-24: 40 mg via INTRAVENOUS
  Filled 2020-07-24: qty 4

## 2020-07-24 MED ORDER — IPRATROPIUM-ALBUTEROL 0.5-2.5 (3) MG/3ML IN SOLN
3.0000 mL | Freq: Once | RESPIRATORY_TRACT | Status: AC
  Administered 2020-07-24: 3 mL via RESPIRATORY_TRACT
  Filled 2020-07-24: qty 3

## 2020-07-24 NOTE — ED Provider Notes (Signed)
[facility] EMERGENCY DEPARTMENT Provider Note   CSN: [id number] Arrival date & time: 07/24/20  1437     History Chief Complaint  Patient presents with  . Shortness of Breath  . Chest Pain    Sharon Vasquez is a 70 y.o. female.  70yo F w/ extensive PMH below including COPD on home O2, A fib on anticoagulation, PE, CHF, HTN, HLD, morbid obesity who p/w shortness of breath. PT states she always has SOB that interferes with her ADLs. She states she's normally on 2L O2 but has gone up to 2.5-3L recently. She notes that over the past few days, she's had increased LE edema despite taking her medications. She feels like she's not getting rid of fluid like she usually does. She reports associated chest pressure. She had a nosebleed this morning and reports seasonal allergy symptoms. She has a chronic cough. No fevers, vomiting, or diarrhea. No recent changes to medications.  The history is provided by the patient.  Shortness of Breath Associated symptoms: chest pain   Chest Pain Associated symptoms: shortness of breath        Past Medical History:  Diagnosis Date  . A-fib ([place])   . Anxiety   . Arthritis    "qwhre; joints, back" (04/17/2017)  . Benign breast cyst in female, left 01/08/2017   Found by Screening mammogram, evaluated by [facility] on 01/08/17 and determined to be a benign simple breast cyst.  . Cellulitis of left lower leg 05/30/2017  . CHF (congestive heart failure) ([place])   . Chronic low back pain 08/21/2016  . Chronic lower back pain   . Chronic venous insufficiency    [name] 05/30/2017  . COPD (chronic obstructive pulmonary disease) ([place])   . Depression   . DVT (deep venous thrombosis) ([place]) 11/16/2016  . GERD (gastroesophageal reflux disease)   . Headache    "weekly for the last 3 months" (04/17/2017)  . Hyperlipidemia   . Hypertension   . Morbid obesity ([place])   . PE (pulmonary embolism)   . Pulmonary embolism ([place]) 09/21/2014  . Sleep apnea      Patient Active Problem List   Diagnosis Date Noted  . Hyperlipidemia 05/14/2020  . Acute respiratory failure with hypoxia ([place]) 04/10/2020  . Hypertensive urgency 04/10/2020  . Swallowing difficulty 11/10/2019  . Chronic respiratory failure with hypoxia ([place]) 09/30/2019  . Mixed incontinence, urge and stress (female) (female) 08/21/2019  . Overgrown toenails 07/01/2019  . Preventative health care 06/04/2019  . GERD (gastroesophageal reflux disease) 01/20/2019  . Coronary artery calcification seen on CT scan 04/12/2018  . ILD (interstitial lung disease) ([place]) 02/13/2018  . Pain and numbness of right upper extremity 02/08/2018  . Long term current use of amiodarone 12/19/2017  . OSA (obstructive sleep apnea) 11/14/2017  . Chronic pain syndrome 07/27/2017  . Major depression, recurrent, chronic ([place]) 02/01/2017  . Osteoporosis 12/14/2016  . Drug induced constipation 08/21/2016  . Elevated blood sugar 07/19/2016  . Aortic atherosclerosis ([place]) 07/17/2016  . Chronic venous insufficiency 07/12/2016  . Chronic anticoagulation 07/12/2016  . Tobacco use 07/11/2016  . Acute exacerbation of chronic obstructive pulmonary disease (COPD) ([place])   . Diastolic heart failure ([place])   . History of pulmonary embolism   . Atrial fibrillation with controlled ventricular response ([place])   . Vocal cord polyp 12/18/2015  . Laryngopharyngeal reflux 12/18/2015  . Morbid obesity ([place])   . Peripheral vascular disease ([place])   . Benign essential HTN   . Primary osteoarthritis  of both knees 09/16/2014    Past Surgical History:  Procedure Laterality Date  . ABDOMINAL HYSTERECTOMY    . APPENDECTOMY    . BALLOON DILATION N/A 03/09/2020   Procedure: BALLOON DILATION;  Surgeon: Otis Brace, MD;  Location: [facility];  Service: Gastroenterology;  Laterality: N/A;  . BIOPSY  03/09/2020   Procedure: BIOPSY;  Surgeon: Otis Brace, MD;  Location: [facility];  Service: Gastroenterology;;  . BREAST  CYST EXCISION Left    "six o'clock"  . BREAST LUMPECTOMY Left   . CHOLECYSTECTOMY    . DILATION AND CURETTAGE OF UTERUS    . ESOPHAGOGASTRODUODENOSCOPY (EGD) WITH PROPOFOL N/A 03/09/2020   Procedure: ESOPHAGOGASTRODUODENOSCOPY (EGD) WITH PROPOFOL;  Surgeon: Otis Brace, MD;  Location: [facility];  Service: Gastroenterology;  Laterality: N/A;  . IR ABLATE LIVER CRYOABLATION  07/23/2019  . IR RADIOLOGIST EVAL & MGMT  07/18/2019  . TONSILLECTOMY AND ADENOIDECTOMY    . TUBAL LIGATION       OB History   No obstetric history on file.     Family History  Problem Relation Age of Onset  . Breast cancer Mother   . Hypertension Mother   . Hyperlipidemia Mother   . Hypertension Maternal Grandfather   . Hyperlipidemia Maternal Grandfather     Social History   Tobacco Use  . Smoking status: Former Smoker    Packs/day: 2.00    Years: 55.00    Pack years: 110.00    Types: Cigarettes    Start date: 08/16/1961    Quit date: 05/03/2020    Years since quitting: 0.2  . Smokeless tobacco: Never Used  Vaping Use  . Vaping Use: Never used  Substance Use Topics  . Alcohol use: No  . Drug use: No    Home Medications Prior to Admission medications   Medication Sig Start Date End Date Taking? Authorizing Provider  predniSONE (DELTASONE) 20 MG tablet Take 2 tablets (40 mg total) by mouth daily. Take 40 mg by mouth daily for 3 days, then 20mg  by mouth daily for 3 days, then 10mg  daily for 3 days 07/24/20  Yes Joren Rehm, Wenda Overland, MD  aspirin EC 81 MG tablet Take 81 mg by mouth daily.    [provider]  atorvastatin (LIPITOR) 40 MG tablet Take 1 tablet (40 mg total) by mouth daily. 12/24/19   Mitzi Hansen, MD  benazepril (LOTENSIN) 40 MG tablet Take 0.5 tablets (20 mg total) by mouth daily. 04/15/20 04/15/21  Andrew Au, MD  esomeprazole (NEXIUM) 40 MG capsule Take 1 capsule (40 mg total) by mouth daily as needed (for heartburn or indigestion). Patient taking differently:  Take 40 mg by mouth daily. 01/20/19   Marcelyn Bruins, MD  Fluticasone-Salmeterol (ADVAIR) 250-50 MCG/DOSE AEPB Inhale 1 puff into the lungs 2 (two) times daily. 06/18/20   Martyn Ehrich, NP  gabapentin (NEURONTIN) 400 MG capsule TAKE 1 CAPSULE BY MOUTH  TWICE DAILY Patient taking differently: Take 400 mg by mouth 2 (two) times daily. 02/05/20   Madalyn Rob, MD  ibuprofen (ADVIL) 200 MG tablet Take 800 mg by mouth every 6 (six) hours as needed (inflammation). Patient not taking: Reported on 06/18/2020    [provider]  ipratropium-albuterol (DUONEB) 0.5-2.5 (3) MG/3ML SOLN Take 3 mLs by nebulization every 6 (six) hours as needed. 06/18/20   Martyn Ehrich, NP  metoprolol succinate (TOPROL-XL) 50 MG 24 hr tablet TAKE 1 TABLET(50 MG) BY MOUTH DAILY WITH OR IMMEDIATELY FOLLOWING A MEAL 04/20/20  [place], Carley D, DO  mirabegron ER (MYRBETRIQ) 50 MG TB24 tablet Take 1 tablet (50 mg total) by mouth daily. 05/14/20   Andrew Au, MD  potassium chloride (KLOR-CON) 10 MEQ tablet Take 1 tablet (10 mEq total) by mouth 2 (two) times daily. 05/14/20   Andrew Au, MD  PRESCRIPTION MEDICATION Inhale into the lungs at bedtime. cpap    [provider]  rivaroxaban (XARELTO) 20 MG TABS tablet Take 1 tablet (20 mg total) by mouth every morning. 01/23/20   Iona Beard, MD  tiotropium (SPIRIVA HANDIHALER) 18 MCG inhalation capsule PLACE 1 CAPSULE INTO INHALER AND INHALE DAILY Patient taking differently: Place 18 mcg into inhaler and inhale daily. 12/05/19   Mitzi Hansen, MD  torsemide (DEMADEX) 20 MG tablet TAKE 2 TABLETS BY MOUTH DAILY Patient taking differently: Take 20 mg by mouth 2 (two) times daily. 01/26/20   Bloomfield, Nila Nephew D, DO    Allergies    Patient has no known allergies.  Review of Systems   Review of Systems  Respiratory: Positive for shortness of breath.   Cardiovascular: Positive for chest pain.   All other systems reviewed and are negative except  that which was mentioned in HPI  Physical Exam Updated Vital Signs BP (!) 144/83   Pulse 84   Temp 98.5 F (36.9 C)   Resp (!) 22   Ht 6\' 2"  (1.88 m)   Wt (!) 158.8 kg   SpO2 100%   BMI 44.94 kg/m   Physical Exam Constitutional:      General: She is not in acute distress.    Appearance: Normal appearance.  HENT:     Head: Normocephalic and atraumatic.  Eyes:     Conjunctiva/sclera: Conjunctivae normal.  Cardiovascular:     Rate and Rhythm: Normal rate. Rhythm irregular.     Heart sounds: Murmur heard.    Pulmonary:     Effort: Pulmonary effort is normal.     Comments: Expiratory wheezes b/l, normal WOB Abdominal:     General: Abdomen is flat. Bowel sounds are normal. There is no distension.     Palpations: Abdomen is soft.     Tenderness: There is no abdominal tenderness.  Musculoskeletal:     Right lower leg: Edema present.     Left lower leg: Edema present.     Comments: 3+ pitting edema BLE  Skin:    General: Skin is warm and dry.  Neurological:     Mental Status: She is alert and oriented to person, place, and time.     Comments: fluent  Psychiatric:        Mood and Affect: Mood normal.        Behavior: Behavior normal.     ED Results / Procedures / Treatments   Labs (all labs ordered are listed, but only abnormal results are displayed) Labs Reviewed  COMPREHENSIVE METABOLIC PANEL - Abnormal; Notable for the following components:      Result Value   Glucose, Bld 136 (*)    Total Bilirubin 0.2 (*)    All other components within normal limits  BRAIN NATRIURETIC PEPTIDE - Abnormal; Notable for the following components:   B Natriuretic Peptide 128.1 (*)    All other components within normal limits  CBC WITH DIFFERENTIAL/PLATELET  TROPONIN I (HIGH SENSITIVITY)  TROPONIN I (HIGH SENSITIVITY)    EKG EKG Interpretation  Date/Time:  Saturday July 24 2020 14:47:54 EDT Ventricular Rate:  93 PR Interval:    QRS Duration: 106 QT  Interval:  417 QTC  Calculation: 519 R Axis:   -28 Text Interpretation: Atrial fibrillation Borderline left axis deviation Nonspecific T abnormalities, lateral leads Prolonged QT interval Since last tracing rate slower Confirmed by Dorie Rank [phone]) on 07/24/2020 2:53:25 PM   Radiology DG Chest 2 View  Result Date: 07/24/2020 CLINICAL DATA:  Shortness of breath with increased edema.  CHF. EXAM: CHEST - 2 VIEW COMPARISON:  Radiograph 04/10/2020, CT 05/19/2020 FINDINGS: Chronic cardiomegaly. Interstitial thickening suspicious for pulmonary edema, less prominent than on January 2022 exam. No convincing pleural effusions. No focal airspace disease. No pneumothorax. Stable osseous structures. IMPRESSION: Chronic cardiomegaly. Interstitial thickening suspicious for pulmonary edema, improved from January 2022 exam. Electronically Signed   By: Keith Rake M.D.   On: 07/24/2020 17:44    Procedures Procedures   Medications Ordered in ED Medications  ipratropium-albuterol (DUONEB) 0.5-2.5 (3) MG/3ML nebulizer solution 3 mL (3 mLs Nebulization Given 07/24/20 1612)  predniSONE (DELTASONE) tablet 40 mg (40 mg Oral Given 07/24/20 1950)  furosemide (LASIX) injection 40 mg (40 mg Intravenous Given 07/24/20 1952)    ED Course  I have reviewed the triage vital signs and the nursing notes.  Pertinent labs & imaging results that were available during my care of the patient were reviewed by me and considered in my medical decision making (see chart for details).    MDM Rules/Calculators/A&P                          PT non-toxic on exam, stable on 2.5L [place].  Bilateral wheezes noted, gave DuoNeb.  Her lab work today is overall reassuring with negative troponins, BNP 128, normal creatinine.  Chest x-ray is slightly improved from previous imaging.  She has no signs of congestive heart failure exacerbation today.  I have given her an extra dose of Lasix here, have recommended that she contact her cardiologist to discuss this  medication and whether or not she needs medication adjustment but I see no evidence of acute volume overload currently.  She does note that she was previously improving on prednisone and her breathing worsened since she has stopped it.  Because she has wheezing again today, will restart on prednisone for now but discussed that she needs to follow closely with her pulmonologist as this is not a good long-term medication for her.  Reviewed return precautions and she voiced understanding. Final Clinical Impression(s) / ED Diagnoses Final diagnoses:  Chronic obstructive pulmonary disease, unspecified COPD type ([place])  Chronic respiratory failure, unspecified whether with hypoxia or hypercapnia ([place])    Rx / DC Orders ED Discharge Orders         Ordered    predniSONE (DELTASONE) 20 MG tablet  Daily        07/24/20 2049           Dorea Duff, Wenda Overland, MD 07/25/20 0021

## 2020-07-24 NOTE — ED Triage Notes (Signed)
Per EMS: PT from home c/o SOB and right sided chest pain. Leg swelling is getting worse, pain/weakness increasing. Nose bleed this morning believes its due to allergies. EMS found PT in A-fib, pt on Xarelto. Hx: CHF, COPD, Asthma, A-fib, PE

## 2020-08-03 ENCOUNTER — Ambulatory Visit: Payer: Medicare Other | Admitting: *Deleted

## 2020-08-03 ENCOUNTER — Other Ambulatory Visit: Payer: Self-pay

## 2020-08-03 ENCOUNTER — Encounter: Payer: Self-pay | Admitting: Pulmonary Disease

## 2020-08-03 ENCOUNTER — Ambulatory Visit (INDEPENDENT_AMBULATORY_CARE_PROVIDER_SITE_OTHER): Payer: Medicare Other | Admitting: Pulmonary Disease

## 2020-08-03 DIAGNOSIS — J441 Chronic obstructive pulmonary disease with (acute) exacerbation: Secondary | ICD-10-CM | POA: Diagnosis not present

## 2020-08-03 DIAGNOSIS — G4733 Obstructive sleep apnea (adult) (pediatric): Secondary | ICD-10-CM | POA: Diagnosis not present

## 2020-08-03 DIAGNOSIS — J9611 Chronic respiratory failure with hypoxia: Secondary | ICD-10-CM

## 2020-08-03 DIAGNOSIS — I10 Essential (primary) hypertension: Secondary | ICD-10-CM

## 2020-08-03 DIAGNOSIS — I739 Peripheral vascular disease, unspecified: Secondary | ICD-10-CM

## 2020-08-03 DIAGNOSIS — I4891 Unspecified atrial fibrillation: Secondary | ICD-10-CM

## 2020-08-03 DIAGNOSIS — I5033 Acute on chronic diastolic (congestive) heart failure: Secondary | ICD-10-CM

## 2020-08-03 DIAGNOSIS — J449 Chronic obstructive pulmonary disease, unspecified: Secondary | ICD-10-CM

## 2020-08-03 DIAGNOSIS — I5032 Chronic diastolic (congestive) heart failure: Secondary | ICD-10-CM | POA: Diagnosis not present

## 2020-08-03 DIAGNOSIS — J849 Interstitial pulmonary disease, unspecified: Secondary | ICD-10-CM

## 2020-08-03 DIAGNOSIS — E782 Mixed hyperlipidemia: Secondary | ICD-10-CM

## 2020-08-03 NOTE — Assessment & Plan Note (Signed)
Continue oxygen during sleep and exertion, unable to check ambulatory saturation since she is in a wheelchair and not able to walk today

## 2020-08-03 NOTE — Assessment & Plan Note (Signed)
CPAP download was reviewed which shows excellent control of events on 13 cm with large leak and reasonable compliance although with a few missed nights. Compliance was again emphasized  Weight loss encouraged, compliance with goal of at least 4-6 hrs every night is the expectation. Advised against medications with sedative side effects Cautioned against driving when sleepy - understanding that sleepiness will vary on a day to day basis

## 2020-08-03 NOTE — Patient Instructions (Signed)
  Congratulations on cutting down smoking.  You have to quit altogether. Stay on top of fluid retention with Demadex  Try to use your CPAP every night  Stay on Advair 250 and Spiriva

## 2020-08-03 NOTE — Chronic Care Management (AMB) (Signed)
Care Management    RN Visit Note  08/03/2020 Name: Sharon Vasquez [name]: [id number] DOB: Aug 25, 1950  Subjective: Sharon Vasquez is a 70 y.o. year old female who is a primary care patient of Sharon Vasquez, Sharon [place], MD. The care management team was consulted for assistance with disease management and care coordination needs.    Engaged with patient by telephone for follow up visit in response to provider referral for case management and/or care coordination services.   Consent to Services:   [name] was given information about Care Management services today including:  1. Care Management services includes personalized support from designated clinical staff supervised by her physician, including individualized plan of care and coordination with other care providers 2. 24/7 contact phone numbers for assistance for urgent and routine care needs. 3. The patient may stop case management services at any time by phone call to the office staff.  Patient agreed to services and consent obtained.   Assessment: Review of patient past medical history, allergies, medications, health status, including review of consultants reports, laboratory and other test data, was performed as part of comprehensive evaluation and provision of chronic care management services.   SDOH (Social Determinants of Health) assessments and interventions performed:    Care Plan  No Known Allergies  Outpatient Encounter Medications as of 08/03/2020  Medication Sig  . aspirin EC 81 MG tablet Take 81 mg by mouth daily.  [name] atorvastatin (LIPITOR) 40 MG tablet Take 1 tablet (40 mg total) by mouth daily.  . benazepril (LOTENSIN) 40 MG tablet Take 0.5 tablets (20 mg total) by mouth daily.  [name] esomeprazole (NEXIUM) 40 MG capsule Take 1 capsule (40 mg total) by mouth daily as needed (for heartburn or indigestion). (Patient taking differently: Take 40 mg by mouth daily.)  . Fluticasone-Salmeterol (ADVAIR) 250-50 MCG/DOSE AEPB Inhale 1 puff into  the lungs 2 (two) times daily.  [name] gabapentin (NEURONTIN) 400 MG capsule TAKE 1 CAPSULE BY MOUTH  TWICE DAILY (Patient taking differently: Take 400 mg by mouth 2 (two) times daily.)  . ibuprofen (ADVIL) 200 MG tablet Take 800 mg by mouth every 6 (six) hours as needed (inflammation).  [name] ipratropium-albuterol (DUONEB) 0.5-2.5 (3) MG/3ML SOLN Take 3 mLs by nebulization every 6 (six) hours as needed.  . metoprolol succinate (TOPROL-XL) 50 MG 24 hr tablet TAKE 1 TABLET(50 MG) BY MOUTH DAILY WITH OR IMMEDIATELY FOLLOWING A MEAL  . mirabegron ER (MYRBETRIQ) 50 MG TB24 tablet Take 1 tablet (50 mg total) by mouth daily.  . potassium chloride (KLOR-CON) 10 MEQ tablet Take 1 tablet (10 mEq total) by mouth 2 (two) times daily.  . predniSONE (DELTASONE) 20 MG tablet Take 2 tablets (40 mg total) by mouth daily. Take 40 mg by mouth daily for 3 days, then 20mg  by mouth daily for 3 days, then 10mg  daily for 3 days  . PRESCRIPTION MEDICATION Inhale into the lungs at bedtime. cpap  . rivaroxaban (XARELTO) 20 MG TABS tablet Take 1 tablet (20 mg total) by mouth every morning.  . tiotropium (SPIRIVA HANDIHALER) 18 MCG inhalation capsule PLACE 1 CAPSULE INTO INHALER AND INHALE DAILY (Patient taking differently: Place 18 mcg into inhaler and inhale daily.)  . torsemide (DEMADEX) 20 MG tablet TAKE 2 TABLETS BY MOUTH DAILY (Patient taking differently: Take 20 mg by mouth 2 (two) times daily.)   No facility-administered encounter medications on file as of 08/03/2020.    Patient Active Problem List   Diagnosis Date Noted  . Hyperlipidemia 05/14/2020  .  Acute respiratory failure with hypoxia ([place]) 04/10/2020  . Hypertensive urgency 04/10/2020  . Swallowing difficulty 11/10/2019  . Chronic respiratory failure with hypoxia ([place]) 09/30/2019  . Mixed incontinence, urge and stress (female) (female) 08/21/2019  . Overgrown toenails 07/01/2019  . Preventative health care 06/04/2019  . GERD (gastroesophageal reflux disease)  01/20/2019  . Coronary artery calcification seen on CT scan 04/12/2018  . ILD (interstitial lung disease) ([place]) 02/13/2018  . Pain and numbness of right upper extremity 02/08/2018  . Long term current use of amiodarone 12/19/2017  . OSA (obstructive sleep apnea) 11/14/2017  . Chronic pain syndrome 07/27/2017  . Major depression, recurrent, chronic ([place]) 02/01/2017  . Osteoporosis 12/14/2016  . Drug induced constipation 08/21/2016  . Elevated blood sugar 07/19/2016  . Aortic atherosclerosis ([place]) 07/17/2016  . Chronic venous insufficiency 07/12/2016  . Chronic anticoagulation 07/12/2016  . Tobacco use 07/11/2016  . Acute exacerbation of chronic obstructive pulmonary disease (COPD) ([place])   . Diastolic heart failure ([place])   . History of pulmonary embolism   . Atrial fibrillation with controlled ventricular response ([place])   . Vocal cord polyp 12/18/2015  . Laryngopharyngeal reflux 12/18/2015  . Morbid obesity ([place])   . Peripheral vascular disease ([place])   . Benign essential HTN   . Primary osteoarthritis of both knees 09/16/2014    Conditions to be addressed/monitored: Guardianship and HF, a fib, HTN, HLD, hx of PE and DVT, PVD, COPD, ILD, OSA, GERD, OA, anxiety/depression, chronic pain syndrome, incontinence, morbid obesity  Care Plan : CCM RN- COPD (Adult)  Updates made by Barrington Ellison, RN since 08/03/2020 12:00 AM    Problem: Symptom Exacerbation (COPD)     Goal: Symptom Exacerbation Prevented or Minimized   Start Date: 06/24/2019  Expected End Date: 12/24/2020  Recent Progress: On track  Priority: High  Note:   CARE PLAN ENTRY (see longitudinal plan of care for additional care plan information)  Current Barriers:  . Chronic Disease Management support, education, and care coordination needs related to Atrial Fibrillation, CHF, HTN, and COPD- spoke with patient via phone to complete follow up assessment, states she saw Dr Elsworth Soho today and she told him she has been mores short of  breath, he told her it's more likely related to her heart rather than her lungs and he encouraged her to stop smoking and congratulated her on cutting back,  patient says she is "struggling" to be more active per Dr Lianne Moris advice, she is going to her granddaughter's graduation at [employer] this weekend so she is getting her portable tanks filled   Clinical Goal(s) related to Atrial Fibrillation, CHF, HTN, and COPD:  Over the next 30 days, patient will:  . Work with the care management team to address educational, disease management, and care coordination needs  . Begin or continue self health monitoring activities as directed today Measure and record blood pressure 5-7 times per week and assess swelling in lower extremities daily, check O2 sats when short of breath  . Call provider office for new or worsened signs and symptoms Blood pressure findings outside established parameters, breathing outside of baseline,  Shortness of breath, and New or worsened symptom related to CHF and COPD . Call care management team with questions or concerns . Verbalize basic understanding of patient centered plan of care established today  Interventions related to Atrial Fibrillation, CHF, HTN, and COPD:  . Appropriate assessments completed . Reviewed medications with patient and assessed medication taking behavior;  if indicated, discussed importance of  medication adherence  . Reviewed upcoming appointment with Dr Elsworth Soho on 08/03/20 and ensured she has transportation . Discussed plans with patient for ongoing care management follow up and ensured patient has contact number for CCM team and clinic . 07/13/20- messaged provider requesting order for new nebulizer, tubing and mask. Received message from provider that order is in [place]. Messaged [employer] representative Skeet Latch and she acknowledged receipt of order. Called patient to advise her that [employer] should be reaching out to her and also ensured she has [employer]'s contact  number.   Patient Self Care Activities related to Atrial Fibrillation, CHF, HTN, and COPD:  . Patient is unable to independently self-manage chronic health conditions     Care Plan : CCM RN- Chronic pain- Adult  Updates made by Barrington Ellison, RN since 08/03/2020 12:00 AM    Problem: Chronic Pain Management (Chronic Pain)     Long-Range Goal: Chronic Bilateral Knee Pain Managed   Start Date: 06/07/2020  Expected End Date: 09/30/2020  Recent Progress: On track  Priority: High  Note:   Current Barriers:  [name] Knowledge Deficits related to managing acute/chronic pain- patient states she feels she benefited from the hyaluronic acid injections in both knees on 4/5 as she says she can "get up and down more easily' but she says her shortness of breath is preventing her from being more active and she hopes the changes Dr Elsworth Soho made to her treatment plan today 5/3 and her diligence in monitoring her fluid buildup will help . Unable to independently manage chronic pain Nurse Case Manager Clinical Goal(s):  . patient will verbalize understanding of plan for managing pain . patient will attend all scheduled medical appointments: for knee injections . patient will report pain at a level less than 3 to 4 on a 10-10 rating scale . patient will use pharmacological and nonpharmacological pain relief strategies . Patient will verbalize good understanding of hyaluronic knee injections . patient will verbalize acceptable level of pain relief and ability to engage in desired activities . patient will engage in desired activities without an increase in pain level Interventions:  . Collaboration with Andrew Au, MD regarding development and update of comprehensive plan of care as evidenced by provider attestation and co-signature . Inter-disciplinary care team collaboration (see longitudinal plan of care) . - effectiveness of pharmacologic therapy monitored . - pain assessed . - pain treatment goals  reviewed . Evaluation of current treatment plan related to bilateral chronic knee pain and patient's adherence to plan as established by provider . Assessed effectiveness  of viscosupplementation . Discussed plans with patient for ongoing care management follow up and provided patient with direct contact information for care management team Patient Goals/Self Care Activities:   . Self-administers medications as prescribed . Attends all scheduled provider appointments including hyaluronic acid knee injection appointments  . Calls pharmacy for medication refills . Calls provider office for new concerns or questions Follow Up Plan: The care management team will reach out to the patient again over the next 30-60 days.        Plan: The care management team will reach out to the patient again over the next 30-60 days.  Kelli Churn RN, CCM, [facility] RN Care Manager [phone]

## 2020-08-03 NOTE — Assessment & Plan Note (Signed)
I still feel this is the main issue, she does seem to have pulmonary hypertension now. She is try to maintain an active lifestyle.  She has obtained impure weight from [employer], but still taking Demadex twice daily is difficult for her she wonders if she can combine that into 1 dose of 40 mg.  We discussed options around this. I am not sure right heart cath would be helpful here, she likely has WHO 2/3 pulmonary hypertension

## 2020-08-03 NOTE — Assessment & Plan Note (Signed)
>>  ASSESSMENT AND PLAN FOR DIASTOLIC HEART FAILURE (HCC) WRITTEN ON 08/03/2020  9:35 AM BY ALVA, RAKESH V, MD  I still feel this is the main issue, she does seem to have pulmonary hypertension now. She is try to maintain an active lifestyle.  She has obtained impure weight from [employer], but still taking Demadex twice daily is difficult for her she wonders if she can combine that into 1 dose of 40 mg.  We discussed options around this. I am not sure right heart cath would be helpful here, she likely has WHO 2/3 pulmonary hypertension

## 2020-08-03 NOTE — Assessment & Plan Note (Signed)
Last HRCT has not shown any worsening.  I again emphasized complete smoking cessation here She is not a candidate for biopsy, certainly does not need antifibrotic's

## 2020-08-03 NOTE — Patient Instructions (Signed)
Visit Information It was nice speaking with you today. Goals Addressed            This Visit's Progress   . Make and Keep All Appointments       Timeframe:  Long-Range Goal Priority:  High Start Date:            05/10/20                 Expected End Date:    11/07/20                   Follow Up Date 09/03/20   - arrange a ride through an agency 1 week before appointment - ask family or friend for a ride - call to cancel if needed - keep a calendar with prescription refill dates - keep a calendar with appointment dates - learn the bus route - use public transportation    Why is this important?    Part of staying healthy is seeing the doctor for follow-up care.   If you forget your appointments, there are some things you can do to stay on track.    Notes: 08/02/20 meeting goal    . Track and Manage Fluids and Swelling-Heart Failure       Timeframe:  Long-Range Goal Priority:  High Start Date:     06/24/19                        Expected End Date:    12/24/20                   Follow Up Date 09/03/20   - keep legs up while sitting - use salt in moderation - watch for swelling in feet, ankles and legs every day    Why is this important?    It is important to check your weight daily and watch how much salt and liquids you have.   It will help you to manage your heart failure.    Notes: 08/03/20 meeting goal    . Track and Manage My Blood Pressure-Hypertension       Timeframe:  Long-Range Goal Priority:  High Start Date:      06/24/19                       Expected End Date:      12/24/20                 Follow Up Date 09/03/20   - check blood pressure weekly    Why is this important?    You won't feel high blood pressure, but it can still hurt your blood vessels.   High blood pressure can cause heart or kidney problems. It can also cause a stroke.   Making lifestyle changes like losing a little weight or eating less salt will help.   Checking your blood pressure at home  and at different times of the day can help to control blood pressure.   If the doctor prescribes medicine remember to take it the way the doctor ordered.   Call the office if you cannot afford the medicine or if there are questions about it.     Notes: 08/03/20- meeting goal    . Track and Manage My Symptoms-COPD       Timeframe:  Long-Range Goal Priority:  High Start Date:      06/24/19  Expected End Date:      12/25/19                 Follow Up Date 09/03/20   - eliminate symptom triggers at home - keep follow-up appointments - use an extra pillow to sleep    Why is this important?    Tracking your symptoms and other information about your health helps your doctor plan your care.   Write down the symptoms, the time of day, what you were doing and what medicine you are taking.   You will soon learn how to manage your symptoms.     Notes: 08/03/20- meeting goal       The patient verbalized understanding of instructions, educational materials, and care plan provided today and declined offer to receive copy of patient instructions, educational materials, and care plan.   The care management team will reach out to the patient again over the next 30-60 days.   Kelli Churn RN, CCM, [facility] RN Care Manager [phone]

## 2020-08-03 NOTE — Progress Notes (Signed)
   Subjective:    Patient ID: Sharon Vasquez, female    DOB: Aug 23, 1950, 70 y.o.   [name]: [id number]  HPI  55 yomorbidly obese smoker for follow-up of ILD and OSA She smoked up to 2 packs/day,  now decreased to about 4-5 cigarettes daily  PMH -chronic diastolic heart failure.She has a history of pulmonary embolism/ LLE DVT2015  on Xarelto  s/p venoplasty and stenting of left common iliac and external iliac vein Duplex 05/2017 did not show any residual stenosis. -on Xarelto for chronic atrial fibrillation , amiodarone stopped 10/2019   06/2020 tele visit - advair resumed  She complains of increased shortness of breath, NYHA class III including routine activities around the house.  She had an ED visit 4/23 where she was noted to be wheezing bilateral BNP 128 troponin negative, chest x-ray suggested interstitial prominence, given Lasix and prednisone taper. She is mildly improved She arrives in a motorized wheelchair today, trying to stay active.  She continues to smoke a pack lasts her a few weeks  Significant tests/ events reviewed HRCT 05/2020 >> probable RB ILD, no worsening, enlarged pulmonary artery 4.4 cm, 4.4 cm ascending aortic aneurysm  HRCT 10/2019 >> Mild patchy ground-glass opacity and minimal subpleural reticulation without bronchiectasis or honeycombing. Extensive air trapping >> RBILD, enlarged PA  HRCT 12/2017 >>Mild interlobular septal thickening and mild patchy peribronchovascular ground-glass opacity in both lungs. Minimal patchy subpleural reticulation in the upper lobes? Pul edema vs RB-ILD  PFTs9/2019- No obstruction, mild-mod restriction, decreased DLCO  FVC 2.16 (66%), FEV1 1.93 (75%), ratio 89, DLCOcor 16.45 (48%)  FENO 12/2017 5  ECHO 07/2016-EF 60-65%, PA peak pressure 54mm Hg  HST 12/2017 >> AHI 20/h  Esophagram 01/2020 >> Smooth strictured narrowing involving the distal esophagus near the GE junction  Review of Systems neg for any significant  sore throat, dysphagia, itching, sneezing, nasal congestion or excess/ purulent secretions, fever, chills, sweats, unintended wt loss, pleuritic or exertional cp, hempoptysis, orthopnea pnd or change in chronic leg swelling. Also denies presyncope, palpitations, heartburn, abdominal pain, nausea, vomiting, diarrhea or change in bowel or urinary habits, dysuria,hematuria, rash, arthralgias, visual complaints, headache, numbness weakness or ataxia.     Objective:   Physical Exam  Gen. Pleasant, obese, in no distress ENT - no lesions, no post nasal drip Neck: No JVD, no thyromegaly, no carotid bruits Lungs: no use of accessory muscles, no dullness to percussion, decreased without rales or rhonchi  Cardiovascular: Rhythm regular, heart sounds  normal, no murmurs or gallops, 2+ peripheral edema Musculoskeletal: No deformities, no cyanosis or clubbing , no tremors       Assessment & Plan:

## 2020-08-03 NOTE — Assessment & Plan Note (Signed)
She is completing a prednisone taper right now. Continue Advair and Spiriva-I do not think COPD is the main component here clearly obesity is more important and deconditioning now

## 2020-08-05 DIAGNOSIS — F17210 Nicotine dependence, cigarettes, uncomplicated: Secondary | ICD-10-CM | POA: Diagnosis not present

## 2020-08-05 DIAGNOSIS — M170 Bilateral primary osteoarthritis of knee: Secondary | ICD-10-CM | POA: Diagnosis not present

## 2020-08-05 DIAGNOSIS — Z79899 Other long term (current) drug therapy: Secondary | ICD-10-CM | POA: Diagnosis not present

## 2020-08-05 DIAGNOSIS — F17200 Nicotine dependence, unspecified, uncomplicated: Secondary | ICD-10-CM | POA: Diagnosis not present

## 2020-08-08 NOTE — Progress Notes (Signed)
Cardiology Clinic Note   Patient Name: HAILLIE RADU Date of Encounter: 08/11/2020  Primary Care Provider:  Andrew Au, MD Primary Cardiologist:  Quay Burow, MD  Patient Profile    OTTIS SARNOWSKI is a 70 y.o. female  presents to the clinic for follow-up evaluation of her diastolic CHF and atrial fibrillation.  Past Medical History    Past Medical History:  Diagnosis Date  . A-fib ([place])   . Anxiety   . Arthritis    "qwhre; joints, back" (04/17/2017)  . Benign breast cyst in female, left 01/08/2017   Found by Screening mammogram, evaluated by U/S on 01/08/17 and determined to be a benign simple breast cyst.  . Cellulitis of left lower leg 05/30/2017  . CHF (congestive heart failure) ([place])   . Chronic low back pain 08/21/2016  . Chronic lower back pain   . Chronic venous insufficiency    [name] 05/30/2017  . COPD (chronic obstructive pulmonary disease) ([place])   . Depression   . DVT (deep venous thrombosis) ([place]) 11/16/2016  . GERD (gastroesophageal reflux disease)   . Headache    "weekly for the last 3 months" (04/17/2017)  . Hyperlipidemia   . Hypertension   . Morbid obesity ([place])   . PE (pulmonary embolism)   . Pulmonary embolism ([place]) 09/21/2014  . Sleep apnea    Past Surgical History:  Procedure Laterality Date  . ABDOMINAL HYSTERECTOMY    . APPENDECTOMY    . BALLOON DILATION N/A 03/09/2020   Procedure: BALLOON DILATION;  Surgeon: Otis Brace, MD;  Location: [facility];  Service: Gastroenterology;  Laterality: N/A;  . BIOPSY  03/09/2020   Procedure: BIOPSY;  Surgeon: Otis Brace, MD;  Location: [facility];  Service: Gastroenterology;;  . BREAST CYST EXCISION Left    "six o'clock"  . BREAST LUMPECTOMY Left   . CHOLECYSTECTOMY    . DILATION AND CURETTAGE OF UTERUS    . ESOPHAGOGASTRODUODENOSCOPY (EGD) WITH PROPOFOL N/A 03/09/2020   Procedure: ESOPHAGOGASTRODUODENOSCOPY (EGD) WITH PROPOFOL;  Surgeon: Otis Brace, MD;  Location: [facility];   Service: Gastroenterology;  Laterality: N/A;  . IR ABLATE LIVER CRYOABLATION  07/23/2019  . IR RADIOLOGIST EVAL & MGMT  07/18/2019  . TONSILLECTOMY AND ADENOIDECTOMY    . TUBAL LIGATION      Allergies  No Known Allergies  History of Present Illness    Ms. Car has a PMH of PVD, benign essential hypertension, chronic venous insufficiency, aortic atherosclerosis, acute diastolic CHF, atrial fibrillation, and morbid obesity.  She was seen and evaluated by Dr. Alvester Chou 07/12/2016 when she was admitted with diastolic CHF and atrial fibrillation.  She was noted to have a history of treated hyperlipidemia and hypertension.  She had a pulmonary embolus and was placed on Xarelto.  She also had a history of tobacco abuse and continues to smoke.  She had a chest CT 9/19 that showed left main/three-vessel coronary calcification.  She did complain of atypical chest pain.  She underwent a sleep study and was placed on CPAP.  She had a coronary CTA/FFR that showed a physiologically significant lesion in the small nondominant RCA.  She was noted to have gained 20 pounds since she was last seen.  She complained of increased shortness of breath, dyspnea on exertion and orthopnea.  An echocardiogram 4/18 showed normal LV systolic function.  She was last seen by Dr. Gwenlyn Found on 10/24/2019.  During that time she reported she continued to do well.  She did however continue to gain  weight which she attributed to overeating.  She was compliant with her CPAP.  She was noted to have chronic shortness of breath but denied chest pain.  She was mostly homebound at the time of the visit, she reported compliance with her anticoagulation and she continues to smoke.  Admitted to the hospital with COPD exacerbation 04/10/20-04/15/20. Treated with abx and steroid taper.  She  was seen virtually 05/12/2020 in follow-up and stated she was breathing better since her hospitalization. She completed PT at SNF. She reported she remained  sedentary at home.  She slept in a medical bed with head elevated due her sinus congestion.  She reported compliance with her torsemide medication.  She reported that she did not add any extra sodium to her foods and that her sister was helping with her meals.  She was in the process of trying to get an oxygen concentrator for home use.  She reported that she had a pulmonology appointment the next day.  She  had both COVID-19 vaccines and her booster.  I  gave her the salty 6 diet sheet, continued her current medications, had her increase her physical activity as tolerated (encouraged at least 15 minutes of physical activity daily, and planned her follow-up with Dr. Gwenlyn Found in 6 months.  She presented to the emergency department 07/24/2020 with increased shortness of breath which was felt to be secondary to COPD and CHF exacerbation.  She was instructed to contact cardiology to discuss her diuretic medication.  She was seen by pulmonology on 08/03/2020 and instructed to follow-up with cardiology.  She presents the clinic today for follow-up evaluation states she is feeling better now that she has her oxygen concentrator at home.  She reports that she is increasing her physical activity and doing exercises with her lower extremities like standing.  She has significant weakness in her legs and has trouble walking short distances.  However, she is slowly progressing.  She reports her weight is improving now that she is no longer on steroids.  I will give her the salty 6 diet sheet, have her continue to increase her physical activity, continue her current medication regimen and follow-up in 3 to 4 months.  Today she denies chest pain, shortness of breath, lower extremity edema, fatigue, palpitations, melena, hematuria, hemoptysis, diaphoresis, weakness, presyncope, syncope, orthopnea, and PND.   Home Medications    Prior to Admission medications   Medication Sig Start Date End Date Taking? Authorizing  Provider  aspirin EC 81 MG tablet Take 81 mg by mouth daily.    [provider]  atorvastatin (LIPITOR) 40 MG tablet Take 1 tablet (40 mg total) by mouth daily. 12/24/19   Mitzi Hansen, MD  benazepril (LOTENSIN) 40 MG tablet Take 0.5 tablets (20 mg total) by mouth daily. 04/15/20 04/15/21  Andrew Au, MD  esomeprazole (NEXIUM) 40 MG capsule Take 1 capsule (40 mg total) by mouth daily as needed (for heartburn or indigestion). Patient taking differently: Take 40 mg by mouth daily. 01/20/19   Marcelyn Bruins, MD  Fluticasone-Salmeterol (ADVAIR) 250-50 MCG/DOSE AEPB Inhale 1 puff into the lungs 2 (two) times daily. 06/18/20   Martyn Ehrich, NP  gabapentin (NEURONTIN) 400 MG capsule TAKE 1 CAPSULE BY MOUTH  TWICE DAILY Patient taking differently: Take 400 mg by mouth 2 (two) times daily. 02/05/20   Madalyn Rob, MD  ibuprofen (ADVIL) 200 MG tablet Take 800 mg by mouth every 6 (six) hours as needed (inflammation).    [provider]  ipratropium-albuterol (DUONEB) 0.5-2.5 (3) MG/3ML SOLN Take 3 mLs by nebulization every 6 (six) hours as needed. 06/18/20   Martyn Ehrich, NP  metoprolol succinate (TOPROL-XL) 50 MG 24 hr tablet TAKE 1 TABLET(50 MG) BY MOUTH DAILY WITH OR IMMEDIATELY FOLLOWING A MEAL 04/20/20   [place], Carley D, DO  mirabegron ER (MYRBETRIQ) 50 MG TB24 tablet Take 1 tablet (50 mg total) by mouth daily. 05/14/20   Andrew Au, MD  potassium chloride (KLOR-CON) 10 MEQ tablet Take 1 tablet (10 mEq total) by mouth 2 (two) times daily. 05/14/20   Andrew Au, MD  predniSONE (DELTASONE) 20 MG tablet Take 2 tablets (40 mg total) by mouth daily. Take 40 mg by mouth daily for 3 days, then 20mg  by mouth daily for 3 days, then 10mg  daily for 3 days 07/24/20   Little, Wenda Overland, MD  PRESCRIPTION MEDICATION Inhale into the lungs at bedtime. cpap    [provider]  rivaroxaban (XARELTO) 20 MG TABS tablet Take 1 tablet (20 mg total) by mouth every  morning. 01/23/20   Iona Beard, MD  tiotropium (SPIRIVA HANDIHALER) 18 MCG inhalation capsule PLACE 1 CAPSULE INTO INHALER AND INHALE DAILY Patient taking differently: Place 18 mcg into inhaler and inhale daily. 12/05/19   Mitzi Hansen, MD  torsemide (DEMADEX) 20 MG tablet TAKE 2 TABLETS BY MOUTH DAILY Patient taking differently: Take 20 mg by mouth 2 (two) times daily. 01/26/20   Modena Nunnery D, DO    Family History    Family History  Problem Relation Age of Onset  . Breast cancer Mother   . Hypertension Mother   . Hyperlipidemia Mother   . Hypertension Maternal Grandfather   . Hyperlipidemia Maternal Grandfather    She indicated that the status of her mother is unknown. She indicated that the status of her maternal grandfather is unknown.  Social History    Social History   Socioeconomic History  . Marital status: Divorced    Spouse name: Not on file  . Number of children: Not on file  . Years of education: Not on file  . Highest education level: Not on file  Occupational History  . Not on file  Tobacco Use  . Smoking status: Former Smoker    Packs/day: 2.00    Years: 55.00    Pack years: 110.00    Types: Cigarettes    Start date: 08/16/1961    Quit date: 05/03/2020    Years since quitting: 0.2  . Smokeless tobacco: Never Used  Vaping Use  . Vaping Use: Never used  Substance and Sexual Activity  . Alcohol use: No  . Drug use: No  . Sexual activity: Not Currently    Partners: Male  Other Topics Concern  . Not on file  Social History Narrative   Lives in [facility] in [place]. Lives alone, but is dependent in ADLs/IADLs. Previously had [facility] and RN but dismissed them with plans to use the [employer]. Two daughters live nearby. Previously resided in [place].   Social Determinants of Health   Financial Resource Strain: Not on file  Food Insecurity: Not on file  Transportation Needs: Not on file  Physical Activity: Not on file  Stress: Not  on file  Social Connections: Not on file  Intimate Partner Violence: Not on file     Review of Systems    General:  No chills, fever, night sweats or weight changes.  Cardiovascular:  No chest pain, dyspnea on exertion, edema,  orthopnea, palpitations, paroxysmal nocturnal dyspnea. Dermatological: No rash, lesions/masses Respiratory: No cough, dyspnea Urologic: No hematuria, dysuria Abdominal:   No nausea, vomiting, diarrhea, bright red blood per rectum, melena, or hematemesis Neurologic:  No visual changes, wkns, changes in mental status. All other systems reviewed and are otherwise negative except as noted above.  Physical Exam    VS:  BP (!) 152/108 (BP Location: Right Arm, Patient Position: Sitting, Cuff Size: Large)   Pulse 96   Ht 6\' 2"  (1.88 m)   Wt (!) 392 lb 12.8 oz (178.2 kg)   SpO2 93%   BMI 50.43 kg/m  , BMI Body mass index is 50.43 kg/m. GEN: Well nourished, well developed, in no acute distress. HEENT: normal. Neck: Supple, no JVD, carotid bruits, or masses. Cardiac: RRR, no murmurs, rubs, or gallops. No clubbing, cyanosis, edema.  Radials/DP/PT 2+ and equal bilaterally.  Respiratory:  Respirations regular and unlabored, clear to auscultation bilaterally. GI: Soft, nontender, nondistended, BS + x 4. MS: no deformity or atrophy. Skin: warm and dry, no rash. Neuro:  Strength and sensation are intact. Psych: Normal affect.  Accessory Clinical Findings    Recent Labs: 04/15/2020: Magnesium 1.8 07/24/2020: ALT 16; B Natriuretic Peptide 128.1; BUN 11; Creatinine, Ser 0.95; Hemoglobin 12.8; Platelets 394; Potassium 3.7; Sodium 142   Recent Lipid Panel    Component Value Date/Time   CHOL 142 05/14/2020 1033   TRIG 214 (H) 05/14/2020 1033   HDL 25 (L) 05/14/2020 1033   CHOLHDL 5.7 (H) 05/14/2020 1033   LDLCALC 81 05/14/2020 1033    ECG personally reviewed by me today-none today.  Echocardiogram 09/30/2019 Echocardiogram 09/30/2019  IMPRESSIONS    1. Left  ventricular ejection fraction, by estimation, is 65 to 70%. The  left ventricle has hyperdynamic function. The left ventricle has no  regional wall motion abnormalities. The left ventricular internal cavity  size was mildly dilated. There is mild  concentric left ventricular hypertrophy. Left ventricular diastolic  parameters are consistent with Grade II diastolic dysfunction  (pseudonormalization). Elevated left atrial pressure.  2. Right ventricular systolic function is hyperdynamic. The right  ventricular size is normal.  3. Left atrial size was moderately dilated.  4. The mitral valve is normal in structure. Trivial mitral valve  regurgitation.  5. The aortic valve is normal in structure. Aortic valve regurgitation is  not visualized. Mild aortic valve sclerosis is present, with no evidence  of aortic valve stenosis.  6. The inferior vena cava is dilated in size with >50% respiratory  variability, suggesting right atrial pressure of 8 mmHg.   Comparison(s): Prior images reviewed side by side. The left ventricular  function is unchanged. The left ventricular diastolic function is  significantly worse. Findings are consistent ith high output diastolic  heart failure, such as obesity cardiomyopathy  (should exclude anemia, thyrotoxicosis, AV fistula, etc.).  Assessment & Plan   1.  Heart failure with preserved ejection fraction- 1+ bilateral lower extremity.  Reports her breathing is better than it was.  Echocardiogram showed an EF of 65-70%, G2 DD Continue torsemide, potassium, metoprolol, Benzapril, HCTZ Heart healthy low-sodium diet-salty 6 given Increase physical activity as tolerated-discussed improving fitness  Essential hypertension-BP today 152/108 had not yet taken her blood pressure medication.  Well-controlled at home.  130s over 80s. Continue Benzapril, metoprolol, torsemide, HCTZ Heart healthy low-sodium diet-salty 6 given Increase physical activity as  tolerated  Morbid obesity-weight today 392 pounds. Weight decreasing since she is stopped taking prednisone. Continue weight loss Heart healthy low-sodium  diet-salty 6 given Increase physical activity as tolerated  Paroxysmal atrial fibrillation-heart rate today 96.  Cardiac unaware. Continue Xarelto Heart healthy low-sodium diet-salty 6 given Increase physical activity as tolerated Avoid triggers caffeine, chocolate, EtOH, dehydration etc.  Coronary artery calcification- denies chest pain.  Identified on coronary CTA and previous CT scan Continue Xarelto, aspirin, metoprolol Heart healthy low-sodium diet-salty 6 given Increase physical activity as tolerated  COPD-continues to smoke 4 cigarettes/day..  Smoking cessation encouraged Follows with pulmonology  Disposition: Follow-up with Dr. Gwenlyn Found in 3-4 months.  Jossie Ng. Miryah Ralls NP-C    08/11/2020, 11:39 AM [place] [address] Office [phone] Fax [phone]  Notice: This dictation was prepared with Dragon dictation along with smaller phrase technology. Any transcriptional errors that result from this process are unintentional and may not be corrected upon review.  I spent 15 minutes examining this patient, reviewing medications, and using patient centered shared decision making involving her cardiac care.  Prior to her visit I spent greater than 20 minutes reviewing her past medical history,  medications, and prior cardiac tests.

## 2020-08-09 ENCOUNTER — Other Ambulatory Visit: Payer: Self-pay | Admitting: Internal Medicine

## 2020-08-09 DIAGNOSIS — I5033 Acute on chronic diastolic (congestive) heart failure: Secondary | ICD-10-CM

## 2020-08-11 ENCOUNTER — Other Ambulatory Visit: Payer: Self-pay

## 2020-08-11 ENCOUNTER — Encounter: Payer: Self-pay | Admitting: General Practice

## 2020-08-11 ENCOUNTER — Ambulatory Visit (INDEPENDENT_AMBULATORY_CARE_PROVIDER_SITE_OTHER): Payer: Medicare Other | Admitting: General Practice

## 2020-08-11 VITALS — BP 152/108 | HR 96 | Ht 74.0 in | Wt 392.8 lb

## 2020-08-11 DIAGNOSIS — I2510 Atherosclerotic heart disease of native coronary artery without angina pectoris: Secondary | ICD-10-CM | POA: Diagnosis not present

## 2020-08-11 DIAGNOSIS — I10 Essential (primary) hypertension: Secondary | ICD-10-CM

## 2020-08-11 DIAGNOSIS — I5032 Chronic diastolic (congestive) heart failure: Secondary | ICD-10-CM | POA: Diagnosis not present

## 2020-08-11 DIAGNOSIS — I480 Paroxysmal atrial fibrillation: Secondary | ICD-10-CM | POA: Diagnosis not present

## 2020-08-11 DIAGNOSIS — J439 Emphysema, unspecified: Secondary | ICD-10-CM | POA: Diagnosis not present

## 2020-08-11 MED ORDER — HYDROCHLOROTHIAZIDE 50 MG PO TABS: 50.0000 mg | ORAL_TABLET | Freq: Every day | ORAL | Status: DC

## 2020-08-11 NOTE — Patient Instructions (Signed)
Medication Instructions:  The current medical regimen is effective;  continue present plan and medications as directed. Please refer to the Current Medication list given to you today. *If you need a refill on your cardiac medications before your next appointment, please call your pharmacy*  Lab Work:   Testing/Procedures:  NONE    NONE  Special Instructions TAKE AND LOG YOUR WEIGHT AND BLOOD PRESSURE DAILY  PLEASE READ AND FOLLOW SALTY 6-ATTACHED-1,800mg  daily  PLEASE INCREASE PHYSICAL ACTIVITY AS TOLERATED; TRY STANDING MULTIPLE TIMES DAILY  Follow-Up: Your next appointment:  3-4 month(s) In Person with Quay Burow, MD ONLY  At [facility], you and your health needs are our priority.  As part of our continuing mission to provide you with exceptional heart care, we have created designated Provider Care Teams.  These Care Teams include your primary Cardiologist (physician) and Advanced Practice Providers (APPs -  Physician Assistants and Nurse Practitioners) who all work together to provide you with the care you need, when you need it.            6 SALTY THINGS TO AVOID     1,800MG  DAILY

## 2020-08-11 NOTE — Addendum Note (Signed)
Addended by: Waylan Rocher on: 08/11/2020 01:59 PM   Modules accepted: Orders

## 2020-08-17 ENCOUNTER — Telehealth: Payer: Self-pay

## 2020-08-17 DIAGNOSIS — J449 Chronic obstructive pulmonary disease, unspecified: Secondary | ICD-10-CM | POA: Diagnosis not present

## 2020-08-17 NOTE — Telephone Encounter (Signed)
Seen, thank you.

## 2020-08-17 NOTE — Telephone Encounter (Signed)
Requesting to speak with a nurse about something, please call pt back.  

## 2020-08-17 NOTE — Telephone Encounter (Signed)
Return pt's call. States she has a sore on her left leg that's "leaking" ; x 1 week. She has not ben putting anything on it. She rides transportation, unable to come today; appt schedule tomorrow w/Dr Court Joy @ 0945 AM.

## 2020-08-18 ENCOUNTER — Ambulatory Visit (INDEPENDENT_AMBULATORY_CARE_PROVIDER_SITE_OTHER): Payer: Medicare Other | Admitting: Internal Medicine

## 2020-08-18 ENCOUNTER — Encounter: Payer: Self-pay | Admitting: Internal Medicine

## 2020-08-18 ENCOUNTER — Other Ambulatory Visit: Payer: Self-pay

## 2020-08-18 ENCOUNTER — Other Ambulatory Visit: Payer: Self-pay | Admitting: Internal Medicine

## 2020-08-18 DIAGNOSIS — I83029 Varicose veins of left lower extremity with ulcer of unspecified site: Secondary | ICD-10-CM

## 2020-08-18 DIAGNOSIS — L97929 Non-pressure chronic ulcer of unspecified part of left lower leg with unspecified severity: Secondary | ICD-10-CM | POA: Diagnosis not present

## 2020-08-18 DIAGNOSIS — I10 Essential (primary) hypertension: Secondary | ICD-10-CM

## 2020-08-18 DIAGNOSIS — I5032 Chronic diastolic (congestive) heart failure: Secondary | ICD-10-CM

## 2020-08-18 NOTE — Assessment & Plan Note (Addendum)
  Hx of chronic venous insufficiency with.  Patient had previous venoplasty and stenting of left common iliac and external iliac veins.Patient presents today with venous ulcer left leg as seen above.  Denies any trauma to the area, no purulent drainage, and no biofilm on wound.  Silicone gel adhesive composite hydrophilic foam dressing was applied and patient will follow-up in 1 week if wound is not healing.  If wound is healing patient was given additional supplies to continue home wound care.

## 2020-08-18 NOTE — Progress Notes (Signed)
   CC: venous ulcer of left leg   HPI:[name] is a 70 y.o. female who presents for evaluation of venous ulcer of left leg. Please see individual problem based A/P for details.   Past Medical History:  Diagnosis Date  . A-fib ([place])   . Anxiety   . Arthritis    "qwhre; joints, back" (04/17/2017)  . Benign breast cyst in female, left 01/08/2017   Found by Screening mammogram, evaluated by U/S on 01/08/17 and determined to be a benign simple breast cyst.  . Cellulitis of left lower leg 05/30/2017  . CHF (congestive heart failure) ([place])   . Chronic low back pain 08/21/2016  . Chronic lower back pain   . Chronic venous insufficiency    [name] 05/30/2017  . COPD (chronic obstructive pulmonary disease) ([place])   . Depression   . DVT (deep venous thrombosis) ([place]) 11/16/2016  . GERD (gastroesophageal reflux disease)   . Headache    "weekly for the last 3 months" (04/17/2017)  . Hyperlipidemia   . Hypertension   . Morbid obesity ([place])   . PE (pulmonary embolism)   . Pulmonary embolism ([place]) 09/21/2014  . Sleep apnea    Review of Systems:   Review of Systems  Constitutional: Negative for chills and fever.  Cardiovascular: Positive for leg swelling (chronic, not increased). Negative for claudication.     Physical Exam: Vitals:   08/18/20 0931  BP: (!) 151/100  Pulse: 92  Temp: 98.2 F (36.8 C)  TempSrc: Oral  SpO2: 98%  Weight: (!) 392 lb 6.4 oz (178 kg)  Height: 6\' 2"  (1.88 m)   General: morbidly obese, uses motorized wheelchair for transport, Wearing nasal canula Cardiovascular: Normal rate, regular rhythm.  No murmurs, rubs, or gallops Pulmonary : Equal breath sounds, No wheezes, rales, or rhonchi Ext 1+ pitting edema in bilateral ext, bulla on right leg. Left leg as below, scab on outside of wound bed.        .   Assessment & Plan:   See Encounters Tab for problem based charting.  Patient discussed with Dr. Evette Doffing

## 2020-08-18 NOTE — Patient Instructions (Addendum)
You should be seen in person in 1 week. If you are healing well you can call to cancel appointment and continue home wound care.

## 2020-08-19 NOTE — Progress Notes (Signed)
Internal Medicine Clinic Attending  Case discussed with Dr. Steen  At the time of the visit.  We reviewed the resident's history and exam and pertinent patient test results.  I agree with the assessment, diagnosis, and plan of care documented in the resident's note.  

## 2020-08-23 DIAGNOSIS — G4733 Obstructive sleep apnea (adult) (pediatric): Secondary | ICD-10-CM | POA: Diagnosis not present

## 2020-08-27 ENCOUNTER — Encounter: Payer: Medicare Other | Admitting: Student

## 2020-09-03 ENCOUNTER — Ambulatory Visit: Payer: Medicare Other | Admitting: *Deleted

## 2020-09-03 DIAGNOSIS — R32 Unspecified urinary incontinence: Secondary | ICD-10-CM

## 2020-09-03 DIAGNOSIS — L97929 Non-pressure chronic ulcer of unspecified part of left lower leg with unspecified severity: Secondary | ICD-10-CM

## 2020-09-03 DIAGNOSIS — I10 Essential (primary) hypertension: Secondary | ICD-10-CM

## 2020-09-03 DIAGNOSIS — I739 Peripheral vascular disease, unspecified: Secondary | ICD-10-CM

## 2020-09-03 DIAGNOSIS — J449 Chronic obstructive pulmonary disease, unspecified: Secondary | ICD-10-CM

## 2020-09-03 DIAGNOSIS — I5033 Acute on chronic diastolic (congestive) heart failure: Secondary | ICD-10-CM

## 2020-09-03 DIAGNOSIS — J9611 Chronic respiratory failure with hypoxia: Secondary | ICD-10-CM

## 2020-09-03 DIAGNOSIS — G894 Chronic pain syndrome: Secondary | ICD-10-CM

## 2020-09-03 DIAGNOSIS — I83029 Varicose veins of left lower extremity with ulcer of unspecified site: Secondary | ICD-10-CM

## 2020-09-03 DIAGNOSIS — I4891 Unspecified atrial fibrillation: Secondary | ICD-10-CM

## 2020-09-03 DIAGNOSIS — E782 Mixed hyperlipidemia: Secondary | ICD-10-CM

## 2020-09-03 NOTE — Chronic Care Management (AMB) (Signed)
  Care Management   Note  09/03/2020 Name: Sharon Vasquez [name]: [id number] DOB: 23-Jul-1950  [name] is enrolled in a Managed Medicaid plan: No.  Received a voice message from patient on 09/01/20 at 10:29 am stating she is trying to make a clinic appointment to have her left leg venous stasis  ulcer redressed. She said she called the clinic on 6/1 and received a message that the clinic was closed  so she is asking for help from this CCM RN.    Plan: Message sent to front desk pool and patient was scheduled for an appointment on 09/09/20.    Kelli Churn RN, CCM, [facility] RN Care Manager [phone]

## 2020-09-08 DIAGNOSIS — M7989 Other specified soft tissue disorders: Secondary | ICD-10-CM | POA: Diagnosis not present

## 2020-09-08 DIAGNOSIS — M170 Bilateral primary osteoarthritis of knee: Secondary | ICD-10-CM | POA: Diagnosis not present

## 2020-09-08 DIAGNOSIS — Z79899 Other long term (current) drug therapy: Secondary | ICD-10-CM | POA: Diagnosis not present

## 2020-09-09 ENCOUNTER — Ambulatory Visit (INDEPENDENT_AMBULATORY_CARE_PROVIDER_SITE_OTHER): Payer: Medicare Other | Admitting: Internal Medicine

## 2020-09-09 ENCOUNTER — Other Ambulatory Visit: Payer: Self-pay

## 2020-09-09 ENCOUNTER — Encounter: Payer: Self-pay | Admitting: Internal Medicine

## 2020-09-09 DIAGNOSIS — L97929 Non-pressure chronic ulcer of unspecified part of left lower leg with unspecified severity: Secondary | ICD-10-CM

## 2020-09-09 DIAGNOSIS — I83029 Varicose veins of left lower extremity with ulcer of unspecified site: Secondary | ICD-10-CM | POA: Diagnosis not present

## 2020-09-09 DIAGNOSIS — I10 Essential (primary) hypertension: Secondary | ICD-10-CM

## 2020-09-09 NOTE — Patient Instructions (Signed)
Please arrange another office visit after returning from [place] so we can place that wrap on your leg. The wrap should help with wound healing by decreasing the fluid in your leg. If you develop fevers, chills or a change in drainage from the wound, please seek urgent evaluation.

## 2020-09-11 NOTE — Assessment & Plan Note (Addendum)
      She presents today for follow up of the wound. She had been seen around 5/18 in our clinic for this at which time she was given wound care recommendations and instructed to follow up in our clinic in 1w if wound was not showing signs of healing.  Wound does not appear significantly better or worse. She denied fever or chills and does not have significant drainage from the wound so infection remains unlikely. I discussed the nature of the ulcer with her and explained that the primary treatment will be to improve the lower extremity edema. I explained this is done with compression wraps.  Given her poor mobility being essentially wheelchair bound and the extent of the edema, she would not be a candidate to place the wraps herself or to use compression stockings at this time. Last set of ABIs were normal and I do not appreciate signs of clinically significant arterial disease that would preclude compression wraps I offered UNA boot placement in the office today and explained typical follow up needed for placement. She does not drive but uses some sort of city transportation so follow up shouldn't be barred by transportation issues.  Overall, I got the impression from her that she was not overly invested in this. We had multiple people in the office working, trying to see if we could get wraps placed today, however she elected to leave prior to this, saying that she had to catch her ride, despite [place] having another 43min prior to that.  Plan I recommended she return for compression wraps, after she returns from [place]. She will likely need these long term so, after speaking with our clinic nursing director, it was recommended that she go to a specialized clinic for this after initial wrap is done in our clinic. I explained to the patient that this wound is unlikely to heal without intervention and will end up causing more issues down the road. She expressed understanding. Reviewed signs and  symptoms of infection to monitor for that would indicate a need for urgent evaluation.

## 2020-09-11 NOTE — Progress Notes (Signed)
Office Visit   Patient ID: Sharon Vasquez, female    DOB: 1951-02-24, 70 y.o.   [name]: [id number]   PCP: Andrew Au, MD   Subjective:  CC: leg wound   Sharon Vasquez is a 70 y.o. year old female who presents for follow up of a left leg wound. Please refer to problem based charting for assessment and plan.      ACTIVE MEDICATIONS   Outpatient Medications Prior to Visit  Medication Sig Dispense Refill   aspirin EC 81 MG tablet Take 81 mg by mouth daily.     atorvastatin (LIPITOR) 40 MG tablet Take 1 tablet (40 mg total) by mouth daily. 90 tablet 3   benazepril (LOTENSIN) 40 MG tablet Take 0.5 tablets (20 mg total) by mouth daily. 30 tablet 0   esomeprazole (NEXIUM) 40 MG capsule Take 1 capsule (40 mg total) by mouth daily as needed (for heartburn or indigestion). (Patient taking differently: Take 40 mg by mouth daily.) 90 capsule 1   Fluticasone-Salmeterol (ADVAIR) 250-50 MCG/DOSE AEPB Inhale 1 puff into the lungs 2 (two) times daily. 60 each 11   gabapentin (NEURONTIN) 400 MG capsule TAKE 1 CAPSULE BY MOUTH  TWICE DAILY (Patient taking differently: Take 400 mg by mouth 2 (two) times daily.) 180 capsule 3   hydrochlorothiazide (HYDRODIURIL) 50 MG tablet Take 1 tablet (50 mg total) by mouth daily.     ibuprofen (ADVIL) 200 MG tablet Take 800 mg by mouth every 6 (six) hours as needed (inflammation).     ipratropium-albuterol (DUONEB) 0.5-2.5 (3) MG/3ML SOLN Take 3 mLs by nebulization every 6 (six) hours as needed. 360 mL 3   metoprolol succinate (TOPROL-XL) 50 MG 24 hr tablet TAKE 1 TABLET(50 MG) BY MOUTH DAILY WITH OR IMMEDIATELY FOLLOWING A MEAL 90 tablet 0   mirabegron ER (MYRBETRIQ) 50 MG TB24 tablet Take 1 tablet (50 mg total) by mouth daily. 90 tablet 1   oxyCODONE (ROXICODONE) 15 MG immediate release tablet Take 15 mg by mouth 4 (four) times daily as needed.     potassium chloride (KLOR-CON) 10 MEQ tablet Take 1 tablet (10 mEq total) by mouth 2 (two) times daily. 180 tablet 1    PRESCRIPTION MEDICATION Inhale into the lungs at bedtime. cpap     rivaroxaban (XARELTO) 20 MG TABS tablet Take 1 tablet (20 mg total) by mouth every morning. 90 tablet 3   tiotropium (SPIRIVA HANDIHALER) 18 MCG inhalation capsule PLACE 1 CAPSULE INTO INHALER AND INHALE DAILY (Patient taking differently: Place 18 mcg into inhaler and inhale daily.) 90 capsule 1   torsemide (DEMADEX) 20 MG tablet TAKE 2 TABLETS BY MOUTH DAILY 180 tablet 1   No facility-administered medications prior to visit.     Objective:   BP 112/75 (BP Location: Right Arm, Patient Position: Sitting, Cuff Size: Normal) Comment (BP Location): wrist  Pulse 76   Temp 97.7 F (36.5 C) (Oral)   Ht 6\' 2"  (1.88 m)   Wt (!) 393 lb 6.4 oz (178.4 kg)   SpO2 98%   BMI 50.51 kg/m  Wt Readings from Last 3 Encounters:  09/09/20 (!) 393 lb 6.4 oz (178.4 kg)  08/18/20 (!) 392 lb 6.4 oz (178 kg)  08/11/20 (!) 392 lb 12.8 oz (178.2 kg)   BP Readings from Last 3 Encounters:  09/09/20 112/75  08/18/20 (!) 151/100  08/11/20 (!) 152/108   Physical exam General: morbidly obese, chronically ill appearing CV: +4 lower extremity edema Skin: chronic venous stasis skin  changes to the bilateral lower extremities. Small ulceration on the anterolateral aspect of the left lower leg. No surrounding erythema. No drainage     Health Maintenance:   Health Maintenance  Topic Date Due   Zoster Vaccines- Shingrix (1 of 2) Never done   COVID-19 Vaccine (4 - Booster) 06/15/2020   COLON CANCER SCREENING ANNUAL FOBT  08/20/2020   INFLUENZA VACCINE  11/01/2020   MAMMOGRAM  06/30/2021   TETANUS/TDAP  06/04/2029   DEXA SCAN  Completed   Hepatitis C Screening  Completed   PNA vac Low Risk Adult  Completed   HPV VACCINES  Aged Out   COLONOSCOPY (Pts 45-22yrs Insurance coverage will need to be confirmed)  Discontinued     Assessment & Plan:   Problem List Items Addressed This Visit       Cardiovascular and Mediastinum   Benign  essential HTN (Chronic)    Blood pressure is at goal in the office today. Continue current management.         Musculoskeletal and Integument   Venous stasis ulcer of left lower extremity ([place])         She presents today for follow up of the wound. She had been seen around 5/18 in our clinic for this at which time she was given wound care recommendations and instructed to follow up in our clinic in 1w if wound was not showing signs of healing.  Wound does not appear significantly better or worse. She denied fever or chills and does not have significant drainage from the wound so infection remains unlikely. I discussed the nature of the ulcer with her and explained that the primary treatment will be to improve the lower extremity edema. I explained this is done with compression wraps.  Given her poor mobility being essentially wheelchair bound and the extent of the edema, she would not be a candidate to place the wraps herself or to use compression stockings at this time. Last set of ABIs were normal and I do not appreciate signs of clinically significant arterial disease that would preclude compression wraps I offered UNA boot placement in the office today and explained typical follow up needed for placement. She does not drive but uses some sort of city transportation so follow up shouldn't be barred by transportation issues.  Overall, I got the impression from her that she was not overly invested in this. We had multiple people in the office working, trying to see if we could get wraps placed today, however she elected to leave prior to this, saying that she had to catch her ride, despite [place] having another 41min prior to that.  Plan I recommended she return for compression wraps, after she returns from [place]. She will likely need these long term so, after speaking with our clinic nursing director, it was recommended that she go to a specialized clinic for this after initial wrap is done  in our clinic. I explained to the patient that this wound is unlikely to heal without intervention and will end up causing more issues down the road. She expressed understanding. Reviewed signs and symptoms of infection to monitor for that would indicate a need for urgent evaluation.        Follow up in 1 week for UNA boot placement. Will need to call in the ortho tech to have placed.   Pt discussed with Dr. Venetia Maxon, MD Internal Medicine Resident PGY-2 [name] Pager: [phone] 09/11/2020 9:04 PM

## 2020-09-11 NOTE — Assessment & Plan Note (Signed)
Blood pressure is at goal in the office today. Continue current management.

## 2020-09-13 NOTE — Progress Notes (Signed)
Internal Medicine Clinic Attending  Case discussed with Dr. Christian  At the time of the visit.  We reviewed the resident's history and exam and pertinent patient test results.  I agree with the assessment, diagnosis, and plan of care documented in the resident's note.  

## 2020-09-15 ENCOUNTER — Encounter: Payer: Self-pay | Admitting: *Deleted

## 2020-09-16 DIAGNOSIS — R0602 Shortness of breath: Secondary | ICD-10-CM | POA: Diagnosis not present

## 2020-09-16 DIAGNOSIS — J449 Chronic obstructive pulmonary disease, unspecified: Secondary | ICD-10-CM | POA: Diagnosis not present

## 2020-09-16 DIAGNOSIS — I509 Heart failure, unspecified: Secondary | ICD-10-CM | POA: Diagnosis not present

## 2020-09-16 DIAGNOSIS — Z7982 Long term (current) use of aspirin: Secondary | ICD-10-CM | POA: Diagnosis not present

## 2020-09-16 DIAGNOSIS — G8929 Other chronic pain: Secondary | ICD-10-CM | POA: Diagnosis not present

## 2020-09-16 DIAGNOSIS — J441 Chronic obstructive pulmonary disease with (acute) exacerbation: Secondary | ICD-10-CM | POA: Diagnosis not present

## 2020-09-16 DIAGNOSIS — R002 Palpitations: Secondary | ICD-10-CM | POA: Diagnosis not present

## 2020-09-16 DIAGNOSIS — I160 Hypertensive urgency: Secondary | ICD-10-CM | POA: Diagnosis not present

## 2020-09-16 DIAGNOSIS — Z7901 Long term (current) use of anticoagulants: Secondary | ICD-10-CM | POA: Diagnosis not present

## 2020-09-16 DIAGNOSIS — J810 Acute pulmonary edema: Secondary | ICD-10-CM | POA: Diagnosis not present

## 2020-09-16 DIAGNOSIS — Z79899 Other long term (current) drug therapy: Secondary | ICD-10-CM | POA: Diagnosis not present

## 2020-09-17 DIAGNOSIS — R002 Palpitations: Secondary | ICD-10-CM | POA: Diagnosis not present

## 2020-09-17 DIAGNOSIS — R0602 Shortness of breath: Secondary | ICD-10-CM | POA: Diagnosis not present

## 2020-09-20 ENCOUNTER — Ambulatory Visit: Payer: Medicare Other | Admitting: *Deleted

## 2020-09-20 DIAGNOSIS — J449 Chronic obstructive pulmonary disease, unspecified: Secondary | ICD-10-CM

## 2020-09-20 DIAGNOSIS — I10 Essential (primary) hypertension: Secondary | ICD-10-CM

## 2020-09-20 DIAGNOSIS — E782 Mixed hyperlipidemia: Secondary | ICD-10-CM

## 2020-09-20 DIAGNOSIS — G894 Chronic pain syndrome: Secondary | ICD-10-CM

## 2020-09-20 DIAGNOSIS — I83029 Varicose veins of left lower extremity with ulcer of unspecified site: Secondary | ICD-10-CM

## 2020-09-20 DIAGNOSIS — I4891 Unspecified atrial fibrillation: Secondary | ICD-10-CM

## 2020-09-20 NOTE — Chronic Care Management (AMB) (Signed)
  Care Management   Note  09/20/2020 Name: Sharon Vasquez [name]: [id number] DOB: 1951/02/08  [name] is enrolled in a Managed Medicaid plan: No.   Patient  left message for this CCM RN at 1:15 pm today stating she required hospitalization recently while she was visiting her daughter and her family in [place] because she was short of breath and her blood pressure was high. Said her family called EMS and she was kept in the hospital for 2 days. Says she received IV lasix in the hospital and her admit weight was 390 lbs and her discharge weight was 355 lbs. Also says she is on a steroid taper for COPD exacerbation. She says she needs to make a follow up clinic appointment to have the wound on her leg assessed and rewrapped.   Upon review of chart , patient was admitted to [facility] from 6/16-6/18 with discharge diagnosis of acute pulmonary edema and COPD exacerbation.   Plan: Message left for patient on her mobile number expressing appreciation for her voice mail and requesting she call the clinic for a follow up appointment for hospital discharge follow up and to have UNA boot placed on LLE to treat chronic venous stasis ulcer per Dr Chase Picket directions on 09/09/20. Also sent message to clinic front desk requesting they call patient to arrange follow up clinic appointment.    Kelli Churn RN, CCM, [facility] RN Care Manager [phone]

## 2020-09-21 ENCOUNTER — Ambulatory Visit: Payer: Medicare Other | Admitting: *Deleted

## 2020-09-21 DIAGNOSIS — I83029 Varicose veins of left lower extremity with ulcer of unspecified site: Secondary | ICD-10-CM

## 2020-09-21 DIAGNOSIS — I4891 Unspecified atrial fibrillation: Secondary | ICD-10-CM

## 2020-09-21 DIAGNOSIS — I10 Essential (primary) hypertension: Secondary | ICD-10-CM

## 2020-09-21 DIAGNOSIS — J449 Chronic obstructive pulmonary disease, unspecified: Secondary | ICD-10-CM

## 2020-09-21 NOTE — Chronic Care Management (AMB) (Signed)
  Care Management   Note  09/21/2020 Name: Sharon Vasquez [name]: [id number] DOB: [birth year]  DESTINI CAMBRE is enrolled in a Managed Medicaid plan: No.  Received message from [place] , clinic front desk staff, she successfully reached patient and attempted to schedule patient for follow up clinic appointment however patient told [place] she is not able to come to the clinic until after July 5th so message was forwarded to Dr. Darrick Meigs. The care management team will reach out to the patient again over the next 309 days.   Kelli Churn RN, CCM, [facility] RN Care Manager [phone]

## 2020-09-28 ENCOUNTER — Telehealth: Payer: Self-pay | Admitting: Internal Medicine

## 2020-09-28 NOTE — Telephone Encounter (Signed)
HFU [facility] PER DR CHRISTIAN FOR 10/07/2020 @ 9:15 AM WITH HER PCP DR Raymondo Band

## 2020-10-05 ENCOUNTER — Encounter: Payer: Self-pay | Admitting: *Deleted

## 2020-10-05 ENCOUNTER — Ambulatory Visit: Payer: Medicare Other | Admitting: *Deleted

## 2020-10-05 DIAGNOSIS — I10 Essential (primary) hypertension: Secondary | ICD-10-CM

## 2020-10-05 DIAGNOSIS — I83029 Varicose veins of left lower extremity with ulcer of unspecified site: Secondary | ICD-10-CM

## 2020-10-05 DIAGNOSIS — E782 Mixed hyperlipidemia: Secondary | ICD-10-CM

## 2020-10-05 DIAGNOSIS — J449 Chronic obstructive pulmonary disease, unspecified: Secondary | ICD-10-CM

## 2020-10-05 DIAGNOSIS — G894 Chronic pain syndrome: Secondary | ICD-10-CM

## 2020-10-05 DIAGNOSIS — L97929 Non-pressure chronic ulcer of unspecified part of left lower leg with unspecified severity: Secondary | ICD-10-CM

## 2020-10-05 DIAGNOSIS — I4891 Unspecified atrial fibrillation: Secondary | ICD-10-CM

## 2020-10-05 NOTE — Patient Instructions (Signed)
Visit Information It was nice speaking with you today.   Goals Addressed             This Visit's Progress    Make and Keep All Appointments       Timeframe:  Long-Range Goal Priority:  High Start Date:            05/10/20                 Expected End Date:    ongoing                  Follow Up Date 11/30/20   - arrange a ride through an agency 1 week before appointment - ask family or friend for a ride - call to cancel if needed - keep a calendar with prescription refill dates - keep a calendar with appointment dates - learn the bus route - use public transportation    Why is this important?   Part of staying healthy is seeing the doctor for follow-up care.  If you forget your appointments, there are some things you can do to stay on track.    Notes: 10/05/20 meeting goal      Track and Manage Fluids and Swelling-Heart Failure       Timeframe:  Long-Range Goal Priority:  High Start Date:     06/24/19                        Expected End Date:    ongoing                  Follow Up Date 11/30/20   - keep legs up while sitting - use salt in moderation - watch for swelling in feet, ankles and legs every day    Why is this important?   It is important to check your weight daily and watch how much salt and liquids you have.  It will help you to manage your heart failure.    Notes: not meeting goal- patient was hospitalized 6/16-6/18/22 in [place] for  pulmonary edema, HF and COPD exacerbation      Track and Manage My Blood Pressure-Hypertension       Timeframe:  Long-Range Goal Priority:  High Start Date:      06/24/19                       Expected End Date:      ongoing               Follow Up Date 11/30/20   - check blood pressure weekly    Why is this important?   You won't feel high blood pressure, but it can still hurt your blood vessels.  High blood pressure can cause heart or kidney problems. It can also cause a stroke.  Making lifestyle changes like  losing a little weight or eating less salt will help.  Checking your blood pressure at home and at different times of the day can help to control blood pressure.  If the doctor prescribes medicine remember to take it the way the doctor ordered.  Call the office if you cannot afford the medicine or if there are questions about it.     Notes: 5/3/  10/05/20- meeting goal      Track and Manage My Symptoms-COPD       Timeframe:  Long-Range Goal Priority:  High Start Date:  06/24/19                       Expected End Date:      12/25/19                 Follow Up Date 09/03/20   - eliminate symptom triggers at home - keep follow-up appointments - use an extra pillow to sleep    Why is this important?   Tracking your symptoms and other information about your health helps your doctor plan your care.  Write down the symptoms, the time of day, what you were doing and what medicine you are taking.  You will soon learn how to manage your symptoms.     Notes: 10/05/20- not meeting goal, was hospitalized in [facility] 6/16-6/18 /22 for pulmonary edema, HF and COPD exacerbation         The patient verbalized understanding of instructions, educational materials, and care plan provided today and declined offer to receive copy of patient instructions, educational materials, and care plan.   The care management team will reach out to the patient again over the next 30 days.   Kelli Churn RN, CCM, [facility] RN Care Manager [phone]

## 2020-10-05 NOTE — Chronic Care Management (AMB) (Signed)
Care Management    RN Visit Note  10/05/2020 Name: Sharon Vasquez [name]: [id number] DOB: [birth year]  Subjective: Sharon Vasquez is a 70 y.o. year old female who is a primary care patient of [place], Rayann N, DO. The care management team was consulted for assistance with disease management and care coordination needs.    Engaged with patient by telephone for follow up visit in response to provider referral for case management and/or care coordination services.   Consent to Services:   Sharon Vasquez was given information about Care Management services today including:  Care Management services includes personalized support from designated clinical staff supervised by her physician, including individualized plan of care and coordination with other care providers 24/7 contact phone numbers for assistance for urgent and routine care needs. The patient may stop case management services at any time by phone call to the office staff.  Patient agreed to services and consent obtained.   Assessment: Review of patient past medical history, allergies, medications, health status, including review of consultants reports, laboratory and other test data, was performed as part of comprehensive evaluation and provision of chronic care management services.   SDOH (Social Determinants of Health) assessments and interventions performed:    Care Plan  No Known Allergies  Outpatient Encounter Medications as of 10/05/2020  Medication Sig   aspirin EC 81 MG tablet Take 81 mg by mouth daily.   atorvastatin (LIPITOR) 40 MG tablet Take 1 tablet (40 mg total) by mouth daily.   benazepril (LOTENSIN) 40 MG tablet Take 0.5 tablets (20 mg total) by mouth daily.   esomeprazole (NEXIUM) 40 MG capsule Take 1 capsule (40 mg total) by mouth daily as needed (for heartburn or indigestion). (Patient taking differently: Take 40 mg by mouth daily.)   Fluticasone-Salmeterol (ADVAIR) 250-50 MCG/DOSE AEPB Inhale 1 puff into the lungs 2  (two) times daily.   gabapentin (NEURONTIN) 400 MG capsule TAKE 1 CAPSULE BY MOUTH  TWICE DAILY (Patient taking differently: Take 400 mg by mouth 2 (two) times daily.)   hydrochlorothiazide (HYDRODIURIL) 50 MG tablet Take 1 tablet (50 mg total) by mouth daily.   ibuprofen (ADVIL) 200 MG tablet Take 800 mg by mouth every 6 (six) hours as needed (inflammation).   ipratropium-albuterol (DUONEB) 0.5-2.5 (3) MG/3ML SOLN Take 3 mLs by nebulization every 6 (six) hours as needed.   metoprolol succinate (TOPROL-XL) 50 MG 24 hr tablet TAKE 1 TABLET(50 MG) BY MOUTH DAILY WITH OR IMMEDIATELY FOLLOWING A MEAL   mirabegron ER (MYRBETRIQ) 50 MG TB24 tablet Take 1 tablet (50 mg total) by mouth daily.   oxyCODONE (ROXICODONE) 15 MG immediate release tablet Take 15 mg by mouth 4 (four) times daily as needed.   potassium chloride (KLOR-CON) 10 MEQ tablet Take 1 tablet (10 mEq total) by mouth 2 (two) times daily.   PRESCRIPTION MEDICATION Inhale into the lungs at bedtime. cpap   rivaroxaban (XARELTO) 20 MG TABS tablet Take 1 tablet (20 mg total) by mouth every morning.   tiotropium (SPIRIVA HANDIHALER) 18 MCG inhalation capsule PLACE 1 CAPSULE INTO INHALER AND INHALE DAILY (Patient taking differently: Place 18 mcg into inhaler and inhale daily.)   torsemide (DEMADEX) 20 MG tablet TAKE 2 TABLETS BY MOUTH DAILY   No facility-administered encounter medications on file as of 10/05/2020.    Patient Active Problem List   Diagnosis Date Noted   Venous stasis ulcer of left lower extremity ([place]) 08/18/2020   Hyperlipidemia 05/14/2020   Hypertensive urgency 04/10/2020  Swallowing difficulty 11/10/2019   Chronic respiratory failure with hypoxia ([place]) 09/30/2019   Mixed incontinence, urge and stress (female) (female) 08/21/2019   Overgrown toenails 07/01/2019   Preventative health care 06/04/2019   GERD (gastroesophageal reflux disease) 01/20/2019   Coronary artery calcification seen on CT scan 04/12/2018   ILD  (interstitial lung disease) ([place]) 02/13/2018   Pain and numbness of right upper extremity 02/08/2018   Long term current use of amiodarone 12/19/2017   OSA (obstructive sleep apnea) 11/14/2017   Chronic pain syndrome 07/27/2017   Major depression, recurrent, chronic ([place]) 02/01/2017   Osteoporosis 12/14/2016   Drug induced constipation 08/21/2016   Aortic atherosclerosis ([place]) 07/17/2016   Chronic venous insufficiency 07/12/2016   Chronic anticoagulation 07/12/2016   Tobacco use 07/11/2016   Acute exacerbation of chronic obstructive pulmonary disease (COPD) (HCC)    Diastolic heart failure ([place])    History of pulmonary embolism    Atrial fibrillation with controlled ventricular response (HCC)    Vocal cord polyp 12/18/2015   Laryngopharyngeal reflux 12/18/2015   Morbid obesity ([place])    Peripheral vascular disease ([place])    Benign essential HTN    Primary osteoarthritis of both knees 09/16/2014    Conditions to be addressed/monitored: HF, a fib, HTN, HLD, hx of PE and DVT, PVD, COPD- on O2, ILD, OSA, GERD, OA, anxiety/depression, chronic pain syndrome, incontinence, morbid obesity    Plan: The care management team will reach out to the patient again over the next 30 days.  Kelli Churn RN, CCM, [facility] RN Care Manager [phone]

## 2020-10-06 DIAGNOSIS — M7989 Other specified soft tissue disorders: Secondary | ICD-10-CM | POA: Diagnosis not present

## 2020-10-06 DIAGNOSIS — Z79899 Other long term (current) drug therapy: Secondary | ICD-10-CM | POA: Diagnosis not present

## 2020-10-06 DIAGNOSIS — M170 Bilateral primary osteoarthritis of knee: Secondary | ICD-10-CM | POA: Diagnosis not present

## 2020-10-07 ENCOUNTER — Other Ambulatory Visit: Payer: Self-pay

## 2020-10-07 ENCOUNTER — Emergency Department (HOSPITAL_COMMUNITY): Payer: Medicare Other

## 2020-10-07 ENCOUNTER — Observation Stay (HOSPITAL_COMMUNITY)
Admission: EM | Admit: 2020-10-07 | Discharge: 2020-10-08 | Disposition: A | Discharge status: Home / Self Care | Payer: Medicare Other | Attending: Student in an Organized Health Care Education/Training Program | Admitting: Student in an Organized Health Care Education/Training Program

## 2020-10-07 ENCOUNTER — Encounter: Payer: Self-pay | Admitting: Internal Medicine

## 2020-10-07 ENCOUNTER — Ambulatory Visit (INDEPENDENT_AMBULATORY_CARE_PROVIDER_SITE_OTHER): Payer: Medicare Other | Admitting: Internal Medicine

## 2020-10-07 VITALS — BP 133/77 | HR 101 | Temp 98.2°F | Wt 382.3 lb

## 2020-10-07 DIAGNOSIS — Z20822 Contact with and (suspected) exposure to covid-19: Secondary | ICD-10-CM | POA: Insufficient documentation

## 2020-10-07 DIAGNOSIS — R6889 Other general symptoms and signs: Secondary | ICD-10-CM | POA: Diagnosis not present

## 2020-10-07 DIAGNOSIS — I4891 Unspecified atrial fibrillation: Secondary | ICD-10-CM | POA: Diagnosis present

## 2020-10-07 DIAGNOSIS — R0602 Shortness of breath: Secondary | ICD-10-CM | POA: Diagnosis not present

## 2020-10-07 DIAGNOSIS — J449 Chronic obstructive pulmonary disease, unspecified: Secondary | ICD-10-CM | POA: Insufficient documentation

## 2020-10-07 DIAGNOSIS — I10 Essential (primary) hypertension: Secondary | ICD-10-CM | POA: Diagnosis not present

## 2020-10-07 DIAGNOSIS — M170 Bilateral primary osteoarthritis of knee: Secondary | ICD-10-CM | POA: Diagnosis not present

## 2020-10-07 DIAGNOSIS — Z743 Need for continuous supervision: Secondary | ICD-10-CM | POA: Diagnosis not present

## 2020-10-07 DIAGNOSIS — I872 Venous insufficiency (chronic) (peripheral): Secondary | ICD-10-CM | POA: Diagnosis present

## 2020-10-07 DIAGNOSIS — Z7901 Long term (current) use of anticoagulants: Secondary | ICD-10-CM | POA: Diagnosis not present

## 2020-10-07 DIAGNOSIS — I5032 Chronic diastolic (congestive) heart failure: Secondary | ICD-10-CM

## 2020-10-07 DIAGNOSIS — Z7982 Long term (current) use of aspirin: Secondary | ICD-10-CM | POA: Insufficient documentation

## 2020-10-07 DIAGNOSIS — R079 Chest pain, unspecified: Secondary | ICD-10-CM | POA: Diagnosis not present

## 2020-10-07 DIAGNOSIS — I739 Peripheral vascular disease, unspecified: Secondary | ICD-10-CM | POA: Diagnosis not present

## 2020-10-07 DIAGNOSIS — L97929 Non-pressure chronic ulcer of unspecified part of left lower leg with unspecified severity: Secondary | ICD-10-CM | POA: Diagnosis not present

## 2020-10-07 DIAGNOSIS — J9611 Chronic respiratory failure with hypoxia: Secondary | ICD-10-CM

## 2020-10-07 DIAGNOSIS — Z87891 Personal history of nicotine dependence: Secondary | ICD-10-CM | POA: Insufficient documentation

## 2020-10-07 DIAGNOSIS — I5020 Unspecified systolic (congestive) heart failure: Secondary | ICD-10-CM | POA: Diagnosis present

## 2020-10-07 DIAGNOSIS — I5030 Unspecified diastolic (congestive) heart failure: Secondary | ICD-10-CM | POA: Diagnosis present

## 2020-10-07 DIAGNOSIS — I499 Cardiac arrhythmia, unspecified: Secondary | ICD-10-CM | POA: Diagnosis not present

## 2020-10-07 DIAGNOSIS — I83029 Varicose veins of left lower extremity with ulcer of unspecified site: Secondary | ICD-10-CM | POA: Diagnosis not present

## 2020-10-07 DIAGNOSIS — I480 Paroxysmal atrial fibrillation: Secondary | ICD-10-CM | POA: Insufficient documentation

## 2020-10-07 DIAGNOSIS — R0789 Other chest pain: Secondary | ICD-10-CM | POA: Diagnosis not present

## 2020-10-07 DIAGNOSIS — I110 Hypertensive heart disease with heart failure: Secondary | ICD-10-CM | POA: Insufficient documentation

## 2020-10-07 DIAGNOSIS — I87312 Chronic venous hypertension (idiopathic) with ulcer of left lower extremity: Secondary | ICD-10-CM | POA: Insufficient documentation

## 2020-10-07 DIAGNOSIS — M1612 Unilateral primary osteoarthritis, left hip: Secondary | ICD-10-CM

## 2020-10-07 DIAGNOSIS — Z79899 Other long term (current) drug therapy: Secondary | ICD-10-CM | POA: Diagnosis not present

## 2020-10-07 LAB — COMPREHENSIVE METABOLIC PANEL
ALT: 17 U/L (ref 0–44)
AST: 20 U/L (ref 15–41)
Albumin: 3.7 g/dL (ref 3.5–5.0)
Alkaline Phosphatase: 72 U/L (ref 38–126)
Anion gap: 11 (ref 5–15)
BUN: 12 mg/dL (ref 8–23)
CO2: 24 mmol/L (ref 22–32)
Calcium: 8.7 mg/dL — ABNORMAL LOW (ref 8.9–10.3)
Chloride: 102 mmol/L (ref 98–111)
Creatinine, Ser: 1.22 mg/dL — ABNORMAL HIGH (ref 0.44–1.00)
GFR, Estimated: 47 mL/min — ABNORMAL LOW (ref >60)
Glucose, Bld: 140 mg/dL — ABNORMAL HIGH (ref 70–99)
Potassium: 3.1 mmol/L — ABNORMAL LOW (ref 3.5–5.1)
Sodium: 137 mmol/L (ref 135–145)
Total Bilirubin: 0.9 mg/dL (ref 0.3–1.2)
Total Protein: 7 g/dL (ref 6.5–8.1)

## 2020-10-07 LAB — CBC WITH DIFFERENTIAL/PLATELET
Abs Immature Granulocytes: 0.01 10*3/uL (ref 0.00–0.07)
Basophils Absolute: 0 10*3/uL (ref 0.0–0.1)
Basophils Relative: 0 %
Eosinophils Absolute: 0.1 10*3/uL (ref 0.0–0.5)
Eosinophils Relative: 2 %
HCT: 39.6 % (ref 36.0–46.0)
Hemoglobin: 13 g/dL (ref 12.0–15.0)
Immature Granulocytes: 0 %
Lymphocytes Relative: 40 %
Lymphs Abs: 2.4 10*3/uL (ref 0.7–4.0)
MCH: 28.4 pg (ref 26.0–34.0)
MCHC: 32.8 g/dL (ref 30.0–36.0)
MCV: 86.7 fL (ref 80.0–100.0)
Monocytes Absolute: 0.3 10*3/uL (ref 0.1–1.0)
Monocytes Relative: 5 %
Neutro Abs: 3.2 10*3/uL (ref 1.7–7.7)
Neutrophils Relative %: 53 %
Platelets: 364 10*3/uL (ref 150–400)
RBC: 4.57 MIL/uL (ref 3.87–5.11)
RDW: 14.2 % (ref 11.5–15.5)
WBC: 6 10*3/uL (ref 4.0–10.5)
nRBC: 0 % (ref 0.0–0.2)

## 2020-10-07 LAB — BRAIN NATRIURETIC PEPTIDE: B Natriuretic Peptide: 75 pg/mL (ref 0.0–100.0)

## 2020-10-07 LAB — TROPONIN I (HIGH SENSITIVITY)
Troponin I (High Sensitivity): 7 ng/L (ref <18)
Troponin I (High Sensitivity): 7 ng/L (ref <18)

## 2020-10-07 MED ORDER — IPRATROPIUM-ALBUTEROL 0.5-2.5 (3) MG/3ML IN SOLN
3.0000 mL | Freq: Once | RESPIRATORY_TRACT | Status: AC
  Administered 2020-10-07: 3 mL via RESPIRATORY_TRACT
  Filled 2020-10-07: qty 3

## 2020-10-07 NOTE — Addendum Note (Signed)
Addended by: Buddy Duty on: 10/07/2020 04:36 PM   Modules accepted: Orders

## 2020-10-07 NOTE — ED Notes (Signed)
Patient transported to X-ray 

## 2020-10-07 NOTE — ED Provider Notes (Signed)
[facility] EMERGENCY DEPARTMENT Provider Note   CSN: [id number] Arrival date & time: 10/07/20  2005     History Chief Complaint  Patient presents with   Chest Pain    Sharon Vasquez is a 70 y.o. female.  70 year old female with extensive past medical history below including CHF, COPD, PE, atrial fibrillation on anticoagulation, venous insufficiency, OSA, morbid obesity who presents with chest pain and shortness of breath.  The patient was admitted to [facility] a few weeks ago for shortness of breath and she states she was given steroids and breathing treatments and also given Lasix for pulmonary edema.  She states that she is always short of breath especially with any exertional activities.  This evening approximately 30 minutes prior to EMS arrival, she began having a burning sensation in her central chest, radiating to right breast.  She states it felt like indigestion.  She received 324mg  ASA and 1 nitroglycerin which helped the pain but pain returned and she refused a second dose due to headache.  She denies any associated nausea, vomiting, or diaphoresis.  Pain is currently very mild.  She reports chronic bilateral lower extremity edema that is actually slightly improved currently.  She normally takes fluid pill once daily but took it last night and again this morning before her visit with her PCP.  She reports compliance with her medications.  Her last breathing treatment was day before yesterday.  The history is provided by the patient.  Chest Pain     Past Medical History:  Diagnosis Date   A-fib [facility])    Anxiety    Arthritis    "qwhre; joints, back" (04/17/2017)   Benign breast cyst in female, left 01/08/2017   Found by Screening mammogram, evaluated by U/S on 01/08/17 and determined to be a benign simple breast cyst.   Cellulitis of left lower leg 05/30/2017   CHF (congestive heart failure) (HCC)    Chronic low back pain 08/21/2016   Chronic lower  back pain    Chronic venous insufficiency    /notes 05/30/2017   COPD (chronic obstructive pulmonary disease) ([place])    Depression    DVT (deep venous thrombosis) ([place]) 11/16/2016   GERD (gastroesophageal reflux disease)    Headache    "weekly for the last 3 months" (04/17/2017)   Hyperlipidemia    Hypertension    Morbid obesity ([place])    PE (pulmonary embolism)    Pulmonary embolism ([place]) 09/21/2014   Sleep apnea     Patient Active Problem List   Diagnosis Date Noted   Osteoarthritis of left hip 10/07/2020   Venous stasis ulcer of left lower extremity ([place]) 08/18/2020   Hyperlipidemia 05/14/2020   Hypertensive urgency 04/10/2020   Swallowing difficulty 11/10/2019   Chronic respiratory failure with hypoxia ([place]) 09/30/2019   Mixed incontinence, urge and stress (female) (female) 08/21/2019   Overgrown toenails 07/01/2019   Preventative health care 06/04/2019   GERD (gastroesophageal reflux disease) 01/20/2019   Coronary artery calcification seen on CT scan 04/12/2018   ILD (interstitial lung disease) ([place]) 02/13/2018   Pain and numbness of right upper extremity 02/08/2018   Long term current use of amiodarone 12/19/2017   OSA (obstructive sleep apnea) 11/14/2017   Chronic pain syndrome 07/27/2017   Major depression, recurrent, chronic ([place]) 02/01/2017   Osteoporosis 12/14/2016   Drug induced constipation 08/21/2016   Aortic atherosclerosis ([place]) 07/17/2016   Chronic venous insufficiency 07/12/2016   Chronic anticoagulation 07/12/2016   Tobacco use  07/11/2016   Acute exacerbation of chronic obstructive pulmonary disease (COPD) (HCC)    Diastolic heart failure (HCC)    History of pulmonary embolism    Atrial fibrillation with controlled ventricular response (HCC)    Vocal cord polyp 12/18/2015   Laryngopharyngeal reflux 12/18/2015   Morbid obesity ([place])    Peripheral vascular disease ([place])    Benign essential HTN    Primary osteoarthritis of both knees 09/16/2014    Past  Surgical History:  Procedure Laterality Date   ABDOMINAL HYSTERECTOMY     APPENDECTOMY     BALLOON DILATION N/A 03/09/2020   Procedure: BALLOON DILATION;  Surgeon: Otis Brace, MD;  Location: [facility];  Service: Gastroenterology;  Laterality: N/A;   BIOPSY  03/09/2020   Procedure: BIOPSY;  Surgeon: Otis Brace, MD;  Location: [facility];  Service: Gastroenterology;;   BREAST CYST EXCISION Left    "six o'clock"   BREAST LUMPECTOMY Left    CHOLECYSTECTOMY     DILATION AND CURETTAGE OF UTERUS     ESOPHAGOGASTRODUODENOSCOPY (EGD) WITH PROPOFOL N/A 03/09/2020   Procedure: ESOPHAGOGASTRODUODENOSCOPY (EGD) WITH PROPOFOL;  Surgeon: Otis Brace, MD;  Location: [facility];  Service: Gastroenterology;  Laterality: N/A;   IR ABLATE LIVER CRYOABLATION  07/23/2019   IR RADIOLOGIST EVAL & MGMT  07/18/2019   TONSILLECTOMY AND ADENOIDECTOMY     TUBAL LIGATION       OB History   No obstetric history on file.     Family History  Problem Relation Age of Onset   Breast cancer Mother    Hypertension Mother    Hyperlipidemia Mother    Hypertension Maternal Grandfather    Hyperlipidemia Maternal Grandfather     Social History   Tobacco Use   Smoking status: Former    Packs/day: 2.00    Years: 55.00    Pack years: 110.00    Types: Cigarettes    Start date: 08/16/1961    Quit date: 05/03/2020    Years since quitting: 0.4   Smokeless tobacco: Never  Vaping Use   Vaping Use: Never used  Substance Use Topics   Alcohol use: No   Drug use: No    Home Medications Prior to Admission medications   Medication Sig Start Date End Date Taking? Authorizing Provider  aspirin EC 81 MG tablet Take 81 mg by mouth daily.   Yes [provider]  atorvastatin (LIPITOR) 40 MG tablet Take 1 tablet (40 mg total) by mouth daily. 12/24/19  Yes Christian, Rylee, MD  benazepril (LOTENSIN) 40 MG tablet Take 0.5 tablets (20 mg total) by mouth daily. 04/15/20 04/15/21 Yes Andrew Au,  MD  esomeprazole (NEXIUM) 40 MG capsule Take 1 capsule (40 mg total) by mouth daily as needed (for heartburn or indigestion). Patient taking differently: Take 40 mg by mouth daily. 01/20/19  Yes Marcelyn Bruins, MD  Fluticasone-Salmeterol (ADVAIR) 250-50 MCG/DOSE AEPB Inhale 1 puff into the lungs 2 (two) times daily. 06/18/20  Yes Martyn Ehrich, NP  gabapentin (NEURONTIN) 400 MG capsule TAKE 1 CAPSULE BY MOUTH  TWICE DAILY Patient taking differently: Take 400 mg by mouth 2 (two) times daily. 02/05/20  Yes Madalyn Rob, MD  hydrochlorothiazide (HYDRODIURIL) 50 MG tablet Take 1 tablet (50 mg total) by mouth daily. Patient taking differently: Take 50 mg by mouth daily as needed (high blood pressure  over 150). 08/11/20  Yes Cleaver, Jossie Ng, NP  ibuprofen (ADVIL) 200 MG tablet Take 800 mg by mouth every 6 (six) hours as needed (  inflammation).   Yes [provider]  ipratropium-albuterol (DUONEB) 0.5-2.5 (3) MG/3ML SOLN Take 3 mLs by nebulization every 6 (six) hours as needed. Patient taking differently: Take 3 mLs by nebulization every 6 (six) hours as needed (shortness of breath/wheezing). 06/18/20  Yes Martyn Ehrich, NP  metoprolol succinate (TOPROL-XL) 50 MG 24 hr tablet TAKE 1 TABLET(50 MG) BY MOUTH DAILY WITH OR IMMEDIATELY FOLLOWING A MEAL Patient taking differently: Take 50 mg by mouth daily. 08/18/20  Yes Madalyn Rob, MD  mirabegron ER (MYRBETRIQ) 50 MG TB24 tablet Take 1 tablet (50 mg total) by mouth daily. 05/14/20  Yes Andrew Au, MD  oxyCODONE (ROXICODONE) 15 MG immediate release tablet Take 15 mg by mouth 4 (four) times daily as needed for pain. 08/05/20  Yes [provider]  OXYGEN Inhale 2 L into the lungs as needed (shortness of breath).   Yes [provider]  potassium chloride (KLOR-CON) 10 MEQ tablet Take 1 tablet (10 mEq total) by mouth 2 (two) times daily. 05/14/20  Yes Andrew Au, MD  PRESCRIPTION MEDICATION Inhale into the lungs at  bedtime. cpap   Yes [provider]  rivaroxaban (XARELTO) 20 MG TABS tablet Take 1 tablet (20 mg total) by mouth every morning. 01/23/20  Yes Iona Beard, MD  tiotropium (SPIRIVA HANDIHALER) 18 MCG inhalation capsule PLACE 1 CAPSULE INTO INHALER AND INHALE DAILY Patient taking differently: Place 18 mcg into inhaler and inhale daily. 12/05/19  Yes Christian, Rylee, MD  torsemide (DEMADEX) 20 MG tablet TAKE 2 TABLETS BY MOUTH DAILY Patient taking differently: Take 40 mg by mouth daily. 08/09/20  Yes Andrew Au, MD  albuterol (VENTOLIN HFA) 108 (90 Base) MCG/ACT inhaler Inhale into the lungs. 09/18/20   [provider]    Allergies    Patient has no known allergies.  Review of Systems   Review of Systems  Cardiovascular:  Positive for chest pain.  All other systems reviewed and are negative except that which was mentioned in HPI  Physical Exam Updated Vital Signs BP 134/82   Pulse 85   Temp 98.5 F (36.9 C) (Oral)   Resp (!) 22   Ht 6\' 2"  (1.88 m)   Wt (!) 172.4 kg   SpO2 97%   BMI 48.79 kg/m   Physical Exam Constitutional:      General: She is not in acute distress.    Appearance: Normal appearance. She is obese.  HENT:     Head: Normocephalic and atraumatic.  Eyes:     Conjunctiva/sclera: Conjunctivae normal.  Cardiovascular:     Rate and Rhythm: Normal rate and regular rhythm.     Heart sounds: Normal heart sounds. No murmur heard. Pulmonary:     Effort: Pulmonary effort is normal.     Breath sounds: Decreased breath sounds and wheezing present.     Comments: Exam limited due to body habitus, diminished bilaterally, occasional expiratory wheezes Abdominal:     General: Abdomen is flat. Bowel sounds are normal. There is no distension.     Palpations: Abdomen is soft.     Tenderness: There is no abdominal tenderness.  Musculoskeletal:     Right lower leg: Edema present.     Left lower leg: Edema present.     Comments: Chronic venous stasis  dermatitis bilateral legs with several areas of serous drainage, no pus or deep ulcers  Skin:    General: Skin is warm and dry.  Neurological:     Mental Status: She is  alert and oriented to person, place, and time.     Comments: fluent  Psychiatric:        Mood and Affect: Mood normal.        Behavior: Behavior normal.    ED Results / Procedures / Treatments   Labs (all labs ordered are listed, but only abnormal results are displayed) Labs Reviewed  COMPREHENSIVE METABOLIC PANEL - Abnormal; Notable for the following components:      Result Value   Potassium 3.1 (*)    Glucose, Bld 140 (*)    Creatinine, Ser 1.22 (*)    Calcium 8.7 (*)    GFR, Estimated 47 (*)    All other components within normal limits  RESP PANEL BY RT-PCR (FLU A&B, COVID) ARPGX2  CBC WITH DIFFERENTIAL/PLATELET  BRAIN NATRIURETIC PEPTIDE  TROPONIN I (HIGH SENSITIVITY)  TROPONIN I (HIGH SENSITIVITY)    EKG EKG Interpretation  Date/Time:  Thursday October 07 2020 22:46:58 EDT Ventricular Rate:  95 PR Interval:    QRS Duration: 110 QT Interval:  429 QTC Calculation: 540 R Axis:   -39 Text Interpretation: Atrial fibrillation Abnormal R-wave progression, late transition LVH with secondary repolarization abnormality Prolonged QT interval similar to previous Confirmed by Theotis Burrow [phone]) on 10/07/2020 10:59:30 PM  Radiology DG Chest 2 View  Result Date: 10/07/2020 CLINICAL DATA:  Chest pain and shortness of breath. History of pulmonary edema. EXAM: CHEST - 2 VIEW COMPARISON:  07/24/2020 FINDINGS: Mild cardiac enlargement. Mild interstitial changes suggested in the lungs likely mild edema. Similar appearance to prior study. No focal consolidation. No pleural effusions. No pneumothorax. Mediastinal contours appear intact. Degenerative changes in the spine. IMPRESSION: Cardiac enlargement with mild interstitial changes in the lungs, likely early edema. Electronically Signed   By: Lucienne Capers M.D.   On:  10/07/2020 22:09    Procedures Procedures   Medications Ordered in ED Medications  ipratropium-albuterol (DUONEB) 0.5-2.5 (3) MG/3ML nebulizer solution 3 mL (3 mLs Nebulization Given 10/07/20 2052)    ED Course  I have reviewed the triage vital signs and the nursing notes.  Pertinent labs & imaging results that were available during my care of the patient were reviewed by me and considered in my medical decision making (see chart for details).    MDM Rules/Calculators/A&P                          No acute distress on arrival.  She did have occasional wheezes, ordered DuoNeb but she has not had any today.  EKG without acute ischemic changes. Labs show K 3.1, Cr 1.22, normal initial trop and BNP. CXR w/ mild interstitial edema.  I reviewed the patient's chart and noted a coronary CTA with clinically significant lesion noted in RCA back in 2020.  Discussed the patient's presentation in this background with cardiologist on-call, Dr. Rudi Rummage.  We agreed to admit the patient to medicine for serial troponins and for cardiology consultation to determine whether she should have heart catheterization.  He recommended holding anticoagulation for now in the event that she gets cathed tomorrow.  Discussed admission with internal medicine, Dr. Darrick Meigs. Final Clinical Impression(s) / ED Diagnoses Final diagnoses:  Chest pain, unspecified type    Rx / DC Orders ED Discharge Orders     None        Jacki Couse, Wenda Overland, MD 10/07/20 2333

## 2020-10-07 NOTE — Assessment & Plan Note (Signed)
Patient recently hospitalized on 6/16-6/18 in [place] for COPD exacerbation and pulmonary edema. Patient was treated with IV lasix, steroids, and breathing treatments. She was sent home with 2-3L O2, however, she does not wear her O2 regularly. She notes that she keeps her O2 near her, in case she needs it. She does wear her CPAP at night regularly. Patient is adherent with her inhalers and finished a 3 day course of steroids after her hospitalization. Her breathing is much improved since her hospitalization.   Plan: Wear home O2 and CPAP at night Follow up with pulmonologist  Continue current regiment: Advair, DuoNeb PRN, Spiriva, and albuterol PRN

## 2020-10-07 NOTE — Assessment & Plan Note (Addendum)
Patient presented today for follow up of left lower extremity venous stasis ulcer. Wound does not appear to be any better or worse at this time, however, there is clear drainage coming from the area. Patient denies any fever or chills and there is no surrounding erythema or purulent drainage, thus infection does not seem likely. Silicone foam bandage and dressing were applied to the wound and patient is to follow up with the [facility] in 1 week to have it checked and redressed, if appropriate. Patient will also be referred to vascular surgery as there is a probable component of arterial disease.     Plan: Silicone foam dressing applied to affected area in office today Follow up in 1 week for dressing change  Refer to vascular surgery   Addendum: Patient called later in the day stating that her dressing had fallen off already due to the fluid in her lower extremities. Referral to wound care was offered to the patient, but she declined. Sent in referral for home healthcare so patient can have assistance with dressing changes as she is unable to complete this on her own due to limited mobility and SOB with activity.

## 2020-10-07 NOTE — Assessment & Plan Note (Signed)
Patient complaining of left hip pain that started 1 year ago. She has a known history of left hip osteoarthritis and has had no recent injuries or falls. Patient is requesting x-rays today of her left knee and hip, however, with her known history of osteoarthritis and no recent injury, imaging is not warranted at this time. Patient is not a surgical candidate for replacement. Advised to follow up with ortho and pain management.  Plan: Follow up with ortho and pain management

## 2020-10-07 NOTE — Assessment & Plan Note (Signed)
Patient is reporting left knee discomfort and pain at this time. The pain has been going on for about 1 year and the patient denies any recent injury or falls. She has a known history of osteoarthritis and the pain is likely secondary to this. Patient typically follows with ortho for hyaluronic gel injections, which has provided her with some relief. She also follows with pain management. Patient is requesting x-rays today of her left knee and hip, however, with her known history of osteoarthritis and no recent injury, imaging is not warranted at this time.   Plan: Follow up with ortho and pain management

## 2020-10-07 NOTE — ED Triage Notes (Signed)
BIB [facility] from home. Pt started having chest pain 30 mins before calling EMS. Says its a burning sensation. Pain is central chest and radiated to right breast. States it feels like heartburn. Hx: CHF, COPD, Afib EMS gave 324 aspirin, 1 nitro. Nitro helped with pain but came back after 10 mins. Pt refused 2 dose of nitro due to it giving her a headache.   146/73 110 heart rate 95%

## 2020-10-07 NOTE — Progress Notes (Signed)
   CC: hospital follow up and venous stasis ulcer  HPI:  [name] is a 70 y.o. with a PMHx of COPD, CHF, HTN, HLD, chronic venous insuffiencey, and a venous stasis ulcer presenting to the [facility] for hospital follow up and management of her venous stasis ulcer.   Please see problem based assessment and plan for additional details.   Past Medical History:  Diagnosis Date   A-fib [facility])    Anxiety    Arthritis    "qwhre; joints, back" (04/17/2017)   Benign breast cyst in female, left 01/08/2017   Found by Screening mammogram, evaluated by U/S on 01/08/17 and determined to be a benign simple breast cyst.   Cellulitis of left lower leg 05/30/2017   CHF (congestive heart failure) (HCC)    Chronic low back pain 08/21/2016   Chronic lower back pain    Chronic venous insufficiency    /notes 05/30/2017   COPD (chronic obstructive pulmonary disease) (HCC)    Depression    DVT (deep venous thrombosis) ([place]) 11/16/2016   GERD (gastroesophageal reflux disease)    Headache    "weekly for the last 3 months" (04/17/2017)   Hyperlipidemia    Hypertension    Morbid obesity ([place])    PE (pulmonary embolism)    Pulmonary embolism ([place]) 09/21/2014   Sleep apnea    Review of Systems:  as per HPI  Physical Exam:  Vitals:   10/07/20 0923  BP: 133/77  Pulse: (!) 101  Temp: 98.2 F (36.8 C)  TempSrc: Oral  SpO2: 90%  Weight: (!) 382 lb 4.8 oz (173.4 kg)   Physical Exam Constitutional:      Appearance: She is obese.  Cardiovascular:     Rate and Rhythm: Normal rate and regular rhythm.     Heart sounds: No murmur heard. Pulmonary:     Effort: Pulmonary effort is normal.     Breath sounds: Wheezing present.  Chest:     Chest wall: No tenderness.  Abdominal:     General: Bowel sounds are normal.     Palpations: Abdomen is soft.     Tenderness: There is no abdominal tenderness.  Musculoskeletal:     Right lower leg: Edema present.     Left lower leg: Edema present.     Comments: 3+  edema bilateral lower extremities with chronic venous stasis ulcer on anterolateral aspect of left lower extremity, clear drainage present  Skin:    General: Skin is warm and dry.  Neurological:     General: No focal deficit present.     Mental Status: She is alert and oriented to person, place, and time.  Psychiatric:        Mood and Affect: Mood normal.        Judgment: Judgment normal.     Assessment & Plan:   See Encounters Tab for problem based charting.  Patient seen with Dr. Daryll Drown

## 2020-10-07 NOTE — Assessment & Plan Note (Addendum)
Patient recently hospitalized from 6/16-6/18 for COPD exacerbation and pulmonary edema. TTE performed during hospitalization and showed no evidence of systolic dysfunction and LVEF 55%. Patient denies any symptoms of fluid overload at this time. Denies orthopnea, SOB, cp, palpitations, or any other symptoms. She does not that she has difficulty getting to the bathroom after taking her torsemide and would like to discuss getting a larger bedside commode in the future. She is taking the 40mg  of torsemide in one dose, as taking it 20mg  BID is too difficult to her with getting to the bathroom.   Plan: Continue metoprolol 50 and torsemide 40

## 2020-10-07 NOTE — Patient Instructions (Addendum)
Thank you, [name] for allowing [place] to provide your care today. Today we discussed your recent hospitalization, your arthritis, and the ulcer on your left leg.    Post hospitalization: You were recently hospitalized for a COPD exacerbation. Continue to wear your home O2 and CPAP at night. Please continue to use your inhalers as directed. I am glad you are feeling better since your hospitalization! Arthritis: You have known arthritis in your left hip and knee. At this time, we do not need to order any xrays, but you can continue to follow with pain management. Ulcer: A foam bandage was placed on your wound on your left lower leg today. We will also refer you to vascular surgery for your legs, as discussed.  I have ordered the following labs for you:  Lab Orders  No laboratory test(s) ordered today     Tests ordered today:  None  Referrals ordered today:   Referral Orders  Ambulatory referral to Vascular Surgery     I have ordered the following medication/changed the following medications:   Stop the following medications: There are no discontinued medications.   Start the following medications: No orders of the defined types were placed in this encounter.    Follow up:  1 week   Remember: To follow up with vascular surgery and to take care of your wound!   Should you have any questions or concerns please call the internal medicine clinic at [phone].    Buddy Duty, D.O. [place]

## 2020-10-07 NOTE — Consult Note (Signed)
Cardiology Consultation:   Patient ID: Sharon Vasquez [name]: [id number]; DOB: 1950-06-09  Admit date: 10/07/2020 Date of Consult: 10/07/2020  PCP:  [name], DO   [place] Providers Cardiologist:  Quay Burow, MD        Patient Profile:   Sharon Vasquez has a PMH of PVD, benign essential hypertension, chronic venous insufficiency, aortic atherosclerosis, chronic diastolic CHF- HFpEF, paroxysmal atrial fibrillation, and morbid obesity presented with c/o chest pain. Cardiology is consulted at the request of ER physician.  History of Present Illness:   Sharon Vasquez has a PMH of PVD, benign essential hypertension, chronic venous insufficiency, aortic atherosclerosis, chronic diastolic CHF- HFpEF, paroxysmal atrial fibrillation, and morbid obesity presented with c/o chest pain.       Past Medical History:  Diagnosis Date   A-fib [facility])    Anxiety    Arthritis    "qwhre; joints, back" (04/17/2017)   Benign breast cyst in female, left 01/08/2017   Found by Screening mammogram, evaluated by U/S on 01/08/17 and determined to be a benign simple breast cyst.   Cellulitis of left lower leg 05/30/2017   CHF (congestive heart failure) (HCC)    Chronic low back pain 08/21/2016   Chronic lower back pain    Chronic venous insufficiency    /notes 05/30/2017   COPD (chronic obstructive pulmonary disease) (HCC)    Depression    DVT (deep venous thrombosis) ([place]) 11/16/2016   GERD (gastroesophageal reflux disease)    Headache    "weekly for the last 3 months" (04/17/2017)   Hyperlipidemia    Hypertension    Morbid obesity ([place])    PE (pulmonary embolism)    Pulmonary embolism ([place]) 09/21/2014   Sleep apnea     Past Surgical History:  Procedure Laterality Date   ABDOMINAL HYSTERECTOMY     APPENDECTOMY     BALLOON DILATION N/A 03/09/2020   Procedure: BALLOON DILATION;  Surgeon: Otis Brace, MD;  Location: [facility];  Service: Gastroenterology;  Laterality: N/A;   BIOPSY   03/09/2020   Procedure: BIOPSY;  Surgeon: Otis Brace, MD;  Location: [facility];  Service: Gastroenterology;;   BREAST CYST EXCISION Left    "six o'clock"   BREAST LUMPECTOMY Left    CHOLECYSTECTOMY     DILATION AND CURETTAGE OF UTERUS     ESOPHAGOGASTRODUODENOSCOPY (EGD) WITH PROPOFOL N/A 03/09/2020   Procedure: ESOPHAGOGASTRODUODENOSCOPY (EGD) WITH PROPOFOL;  Surgeon: Otis Brace, MD;  Location: [facility];  Service: Gastroenterology;  Laterality: N/A;   IR ABLATE LIVER CRYOABLATION  07/23/2019   IR RADIOLOGIST EVAL & MGMT  07/18/2019   TONSILLECTOMY AND ADENOIDECTOMY     TUBAL LIGATION       Home Medications:  Prior to Admission medications   Medication Sig Start Date End Date Taking? Authorizing Provider  albuterol (VENTOLIN HFA) 108 (90 Base) MCG/ACT inhaler Inhale into the lungs. 09/18/20  Yes [provider]  aspirin EC 81 MG tablet Take 81 mg by mouth daily.   Yes [provider]  atorvastatin (LIPITOR) 40 MG tablet Take 1 tablet (40 mg total) by mouth daily. 12/24/19  Yes Christian, Rylee, MD  benazepril (LOTENSIN) 40 MG tablet Take 0.5 tablets (20 mg total) by mouth daily. Patient taking differently: Take 40 mg by mouth daily. 04/15/20 04/15/21 Yes Andrew Au, MD  esomeprazole (NEXIUM) 40 MG capsule Take 1 capsule (40 mg total) by mouth daily as needed (for heartburn or indigestion). Patient taking differently: Take 40 mg by mouth daily. 01/20/19  Yes Marcelyn Bruins, MD  Fluticasone-Salmeterol (ADVAIR) 250-50 MCG/DOSE AEPB Inhale 1 puff into the lungs 2 (two) times daily. 06/18/20  Yes Martyn Ehrich, NP  gabapentin (NEURONTIN) 400 MG capsule TAKE 1 CAPSULE BY MOUTH  TWICE DAILY Patient taking differently: Take 400 mg by mouth 2 (two) times daily. 02/05/20  Yes Madalyn Rob, MD  hydrochlorothiazide (HYDRODIURIL) 50 MG tablet Take 1 tablet (50 mg total) by mouth daily. Patient taking differently: Take 50 mg by mouth daily as needed (high  blood pressure  over 150). 08/11/20  Yes Cleaver, Jossie Ng, NP  ibuprofen (ADVIL) 200 MG tablet Take 800 mg by mouth every 6 (six) hours as needed (inflammation).   Yes [provider]  ipratropium-albuterol (DUONEB) 0.5-2.5 (3) MG/3ML SOLN Take 3 mLs by nebulization every 6 (six) hours as needed. Patient taking differently: Take 3 mLs by nebulization every 6 (six) hours as needed (shortness of breath/wheezing). 06/18/20  Yes Martyn Ehrich, NP  metoprolol succinate (TOPROL-XL) 50 MG 24 hr tablet TAKE 1 TABLET(50 MG) BY MOUTH DAILY WITH OR IMMEDIATELY FOLLOWING A MEAL Patient taking differently: Take 50 mg by mouth daily. 08/18/20  Yes Madalyn Rob, MD  mirabegron ER (MYRBETRIQ) 50 MG TB24 tablet Take 1 tablet (50 mg total) by mouth daily. 05/14/20  Yes Andrew Au, MD  oxyCODONE (ROXICODONE) 15 MG immediate release tablet Take 15 mg by mouth 4 (four) times daily as needed for pain. 08/05/20  Yes [provider]  OXYGEN Inhale 2 L into the lungs as needed (shortness of breath).   Yes [provider]  potassium chloride (KLOR-CON) 10 MEQ tablet Take 1 tablet (10 mEq total) by mouth 2 (two) times daily. 05/14/20  Yes Andrew Au, MD  PRESCRIPTION MEDICATION Inhale into the lungs at bedtime. cpap   Yes [provider]  rivaroxaban (XARELTO) 20 MG TABS tablet Take 1 tablet (20 mg total) by mouth every morning. 01/23/20  Yes Iona Beard, MD  tiotropium (SPIRIVA HANDIHALER) 18 MCG inhalation capsule PLACE 1 CAPSULE INTO INHALER AND INHALE DAILY Patient taking differently: Place 18 mcg into inhaler and inhale daily. 12/05/19  Yes Christian, Rylee, MD  torsemide (DEMADEX) 20 MG tablet TAKE 2 TABLETS BY MOUTH DAILY Patient taking differently: Take 40 mg by mouth daily. 08/09/20  Yes Andrew Au, MD    Inpatient Medications: Scheduled Meds:  Continuous Infusions:  PRN Meds:   Allergies:   No Known Allergies  Social History:   Social History   Socioeconomic  History   Marital status: Divorced    Spouse name: Not on file   Number of children: Not on file   Years of education: Not on file   Highest education level: Not on file  Occupational History   Not on file  Tobacco Use   Smoking status: Former    Packs/day: 2.00    Years: 55.00    Pack years: 110.00    Types: Cigarettes    Start date: 08/16/1961    Quit date: 05/03/2020    Years since quitting: 0.4   Smokeless tobacco: Never  Vaping Use   Vaping Use: Never used  Substance and Sexual Activity   Alcohol use: No   Drug use: No   Sexual activity: Not Currently    Partners: Male  Other Topics Concern   Not on file  Social History Narrative   Lives in [facility] [facility] in [place]. Lives alone, but is dependent in ADLs/IADLs. 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 Two daughters live nearby. Previously resided in [place].   Social Determinants of Health   Financial Resource Strain: Not on file  Food Insecurity: Not on file  Transportation Needs: Not on file  Physical Activity: Not on file  Stress: Not on file  Social Connections: Not on file  Intimate Partner Violence: Not on file    Family History:    Family History  Problem Relation Age of Onset   Breast cancer Mother    Hypertension Mother    Hyperlipidemia Mother    Hypertension Maternal Grandfather    Hyperlipidemia Maternal Grandfather      ROS:  Please see the history of present illness.  All other ROS reviewed and negative.     Physical Exam/Data:   Vitals:   10/07/20 2030 10/07/20 2052 10/07/20 2130 10/07/20 2249  BP: 133/83  122/75 134/82  Pulse: (!) 114  97 85  Resp: (!) 29  (!) 23 (!) 22  Temp:      TempSrc:      SpO2: 98% 92% 94% 97%  Weight:      Height:       No intake or output data in the 24 hours ending 10/07/20 2339 Last 3 Weights 10/07/2020 10/07/2020 09/09/2020  Weight (lbs) 380 lb 382 lb 4.8 oz 393 lb 6.4 oz  Weight (kg) 172.367 kg 173.41 kg  178.445 kg     Body mass index is 48.79 kg/m.  General:  Well nourished, well developed, in no acute distress, morbid obesity++ HEENT: normal Lymph: no adenopathy Neck: no JVD Endocrine:  No thryomegaly Vascular: No carotid bruits; FA pulses 2+ bilaterally without bruits  Cardiac:  irregular rhythm,  no murmur, Lungs:  wheezing++ Abd: soft, nontender, no hepatomegaly  Ext: no edema Musculoskeletal:  chronic venous stasis+ Skin: warm and dry  Neuro:  CNs 2-12 intact, no focal abnormalities noted Psych:  Normal affect    Laboratory Data:  High Sensitivity Troponin:   Recent Labs  Lab 10/07/20 2044 10/07/20 2244  TROPONINIHS 7 7     Chemistry Recent Labs  Lab 10/07/20 2044  NA 137  K 3.1*  CL 102  CO2 24  GLUCOSE 140*  BUN 12  CREATININE 1.22*  CALCIUM 8.7*  GFRNONAA 47*  ANIONGAP 11    Recent Labs  Lab 10/07/20 2044  PROT 7.0  ALBUMIN 3.7  AST 20  ALT 17  ALKPHOS 72  BILITOT 0.9   Hematology Recent Labs  Lab 10/07/20 2044  WBC 6.0  RBC 4.57  HGB 13.0  HCT 39.6  MCV 86.7  MCH 28.4  MCHC 32.8  RDW 14.2  PLT 364   BNP Recent Labs  Lab 10/07/20 2044  BNP 75.0    DDimer No results for input(s): DDIMER in the last 168 hours.   Radiology/Studies:  DG Chest 2 View  Result Date: 10/07/2020 CLINICAL DATA:  Chest pain and shortness of breath. History of pulmonary edema. EXAM: CHEST - 2 VIEW COMPARISON:  07/24/2020 FINDINGS: Mild cardiac enlargement. Mild interstitial changes suggested in the lungs likely mild edema. Similar appearance to prior study. No focal consolidation. No pleural effusions. No pneumothorax. Mediastinal contours appear intact. Degenerative changes in the spine. IMPRESSION: Cardiac enlargement with mild interstitial changes in the lungs, likely early edema. Electronically Signed   By: Lucienne Capers M.D.   On: 10/07/2020 22:09     EKG:  The EKG was personally reviewed and demonstrates:   Afib  Telemetry:  Telemetry was  personally reviewed and demonstrates:  afib, HR 100  Relevant CV Studies: ECHO: 09/30/19  1. Left ventricular ejection fraction, by estimation, is 65 to 70%. The  left ventricle has hyperdynamic function. The left ventricle has no  regional wall motion abnormalities. The left ventricular internal cavity  size was mildly dilated. There is mild  concentric left ventricular hypertrophy. Left ventricular diastolic  parameters are consistent with Grade II diastolic dysfunction  (pseudonormalization). Elevated left atrial pressure.   2. Right ventricular systolic function is hyperdynamic. The right  ventricular size is normal.   3. Left atrial size was moderately dilated.   4. The mitral valve is normal in structure. Trivial mitral valve  regurgitation.   5. The aortic valve is normal in structure. Aortic valve regurgitation is  not visualized. Mild aortic valve sclerosis is present, with no evidence  of aortic valve stenosis.   6. The inferior vena cava is dilated in size with >50% respiratory  variability, suggesting right atrial pressure of 8 mmHg.   CT coronaries: 05/2018 IMPRESSION: 1. Coronary calcium score of 2110. This was 42 percentile for age and sex matched control.   2. Normal coronary origin with left dominance.   3. Moderate to severe diffuse CAD with stenosis 50-69% in the proximal LAD, distal LCX arteries and suspicion for a severe stenosis in the mid portion of the 1. diagonal artery. Additional analysis with CT FFR will be submitted.   4. Moderately dilated pulmonary artery measuring 37 mm suspicious of pulmonary hypertension.  CT FFR: 06/2018 1. Left Main:  No significant stenosis.   2. LAD: No significant stenosis. 3. D1: No significant stenosis. 4. Ramus intermedius: No significant stenosis. 5. LCX: No significant stenosis. 6. RCA: Proximal: 1.0, mid 0.61. IMPRESSION: 1. CT FFR analysis showed significant stenosis in the proximal portion of a small  non-dominant RCA. An aggressive medical management is recommended.    Assessment and Plan:   Chest pain Cardiac CT 2020: LAD mod-severe stenosis in prox 50-69%, D1>70%, non dominant RCA 50-69%, but CT FFR was negative except RCA 0.61 2. Morbid obesity 3. Chronic HFpEF, 65-70% 4. Paroxysmal Afib on xarleto 5. Chronic venous statsis 6. Recent COPD exacerbation 7. H/o PAD 8. Chronic respiratory failure on oxygen 2lts OSA, OHS HTN, HLD  Plan: - patient had 1 episode of chest tightness/indigestion at rest that resolved with NTG, ekg without acute ischemia and trop x1 has been negative. Given her comorbidities and infact had a fairly recent CT Coronaries and FFR- with negative FFR except non dominant RCA, I would like to manage her conservatively first with antianginal therapy. She could have progression of the disease but given negative trops/ekg probably go with conservative route first.  - begin Imdur 30mg  daily - trend trops (unlikely ACS/unstable angina).  - repeat ECHO in am - on exam- appears euvolemic, continue home meds: aspirin, lipitor 40mg , benzapril 20mg , increase toprol xl 75mg  daily - continue xarelto 20mg  daily for afib - still wheezing/COPD, chronic respiratory failure- medication optimization per primary team   We will follow along in the am.    Risk Assessment/Risk Scores:          CHA2DS2-VASc Score =    This indicates a  % annual risk of stroke. The patient's score is based upon:           For questions or updates, please contact [place] Please consult [email or web address] for contact info under    Signed, Renae Fickle, MD  10/07/2020 11:39 PM

## 2020-10-07 NOTE — Assessment & Plan Note (Signed)
>>  ASSESSMENT AND PLAN FOR DIASTOLIC HEART FAILURE (HCC) WRITTEN ON 10/07/2020 11:24 AM BY ATWAY, RAYANN N, DO  Patient recently hospitalized from 6/16-6/18 for COPD exacerbation and pulmonary edema. TTE performed during hospitalization and showed no evidence of systolic dysfunction and LVEF 62%. Patient denies any symptoms of fluid overload at this time. Denies orthopnea, SOB, cp, palpitations, or any other symptoms. She does not that she has difficulty getting to the bathroom after taking her torsemide and would like to discuss getting a larger bedside commode in the future. She is taking the 40mg  of torsemide in one dose, as taking it 20mg  BID is too difficult to her with getting to the bathroom.   Plan: Continue metoprolol 50 and torsemide 40

## 2020-10-07 NOTE — Assessment & Plan Note (Signed)
Blood pressure remains at goal. Continue current regiment.

## 2020-10-08 ENCOUNTER — Observation Stay (HOSPITAL_BASED_OUTPATIENT_CLINIC_OR_DEPARTMENT_OTHER): Payer: Medicare Other

## 2020-10-08 ENCOUNTER — Other Ambulatory Visit: Payer: Self-pay | Admitting: Internal Medicine

## 2020-10-08 DIAGNOSIS — J418 Mixed simple and mucopurulent chronic bronchitis: Secondary | ICD-10-CM | POA: Insufficient documentation

## 2020-10-08 DIAGNOSIS — I10 Essential (primary) hypertension: Secondary | ICD-10-CM | POA: Diagnosis not present

## 2020-10-08 DIAGNOSIS — I5020 Unspecified systolic (congestive) heart failure: Secondary | ICD-10-CM | POA: Diagnosis present

## 2020-10-08 DIAGNOSIS — I4819 Other persistent atrial fibrillation: Secondary | ICD-10-CM

## 2020-10-08 DIAGNOSIS — R079 Chest pain, unspecified: Secondary | ICD-10-CM | POA: Diagnosis present

## 2020-10-08 DIAGNOSIS — I872 Venous insufficiency (chronic) (peripheral): Secondary | ICD-10-CM

## 2020-10-08 DIAGNOSIS — L97829 Non-pressure chronic ulcer of other part of left lower leg with unspecified severity: Secondary | ICD-10-CM

## 2020-10-08 DIAGNOSIS — I480 Paroxysmal atrial fibrillation: Secondary | ICD-10-CM | POA: Diagnosis not present

## 2020-10-08 DIAGNOSIS — E785 Hyperlipidemia, unspecified: Secondary | ICD-10-CM | POA: Diagnosis not present

## 2020-10-08 DIAGNOSIS — I361 Nonrheumatic tricuspid (valve) insufficiency: Secondary | ICD-10-CM | POA: Diagnosis not present

## 2020-10-08 DIAGNOSIS — I5032 Chronic diastolic (congestive) heart failure: Secondary | ICD-10-CM

## 2020-10-08 DIAGNOSIS — I259 Chronic ischemic heart disease, unspecified: Secondary | ICD-10-CM | POA: Diagnosis not present

## 2020-10-08 DIAGNOSIS — R0789 Other chest pain: Secondary | ICD-10-CM | POA: Diagnosis not present

## 2020-10-08 DIAGNOSIS — K219 Gastro-esophageal reflux disease without esophagitis: Secondary | ICD-10-CM

## 2020-10-08 DIAGNOSIS — J449 Chronic obstructive pulmonary disease, unspecified: Secondary | ICD-10-CM

## 2020-10-08 DIAGNOSIS — E876 Hypokalemia: Secondary | ICD-10-CM | POA: Diagnosis not present

## 2020-10-08 DIAGNOSIS — M170 Bilateral primary osteoarthritis of knee: Secondary | ICD-10-CM

## 2020-10-08 DIAGNOSIS — I2510 Atherosclerotic heart disease of native coronary artery without angina pectoris: Secondary | ICD-10-CM

## 2020-10-08 DIAGNOSIS — Z7901 Long term (current) use of anticoagulants: Secondary | ICD-10-CM | POA: Diagnosis not present

## 2020-10-08 DIAGNOSIS — I509 Heart failure, unspecified: Secondary | ICD-10-CM | POA: Diagnosis not present

## 2020-10-08 DIAGNOSIS — I110 Hypertensive heart disease with heart failure: Secondary | ICD-10-CM | POA: Diagnosis not present

## 2020-10-08 DIAGNOSIS — R072 Precordial pain: Secondary | ICD-10-CM | POA: Diagnosis not present

## 2020-10-08 DIAGNOSIS — I4891 Unspecified atrial fibrillation: Secondary | ICD-10-CM | POA: Insufficient documentation

## 2020-10-08 DIAGNOSIS — I5030 Unspecified diastolic (congestive) heart failure: Secondary | ICD-10-CM | POA: Diagnosis present

## 2020-10-08 DIAGNOSIS — M1612 Unilateral primary osteoarthritis, left hip: Secondary | ICD-10-CM

## 2020-10-08 DIAGNOSIS — R32 Unspecified urinary incontinence: Secondary | ICD-10-CM

## 2020-10-08 LAB — BASIC METABOLIC PANEL
Anion gap: 9 (ref 5–15)
BUN: 12 mg/dL (ref 8–23)
CO2: 25 mmol/L (ref 22–32)
Calcium: 8.7 mg/dL — ABNORMAL LOW (ref 8.9–10.3)
Chloride: 106 mmol/L (ref 98–111)
Creatinine, Ser: 0.98 mg/dL (ref 0.44–1.00)
GFR, Estimated: >60 mL/min (ref >60)
Glucose, Bld: 119 mg/dL — ABNORMAL HIGH (ref 70–99)
Potassium: 3.3 mmol/L — ABNORMAL LOW (ref 3.5–5.1)
Sodium: 140 mmol/L (ref 135–145)

## 2020-10-08 LAB — ECHOCARDIOGRAM COMPLETE
AR max vel: 3.99 cm2
AV Area VTI: 3.73 cm2
AV Area mean vel: 3.52 cm2
AV Mean grad: 4 mmHg
AV Peak grad: 7.3 mmHg
Ao pk vel: 1.35 m/s
Area-P 1/2: 5.02 cm2
Height: 74 in
MV VTI: 4.21 cm2
S' Lateral: 3.5 cm
Weight: 6080 oz

## 2020-10-08 LAB — RESP PANEL BY RT-PCR (FLU A&B, COVID) ARPGX2
Influenza A by PCR: NEGATIVE
Influenza B by PCR: NEGATIVE
SARS Coronavirus 2 by RT PCR: NEGATIVE

## 2020-10-08 LAB — MAGNESIUM: Magnesium: 1.6 mg/dL — ABNORMAL LOW (ref 1.7–2.4)

## 2020-10-08 LAB — TROPONIN I (HIGH SENSITIVITY): Troponin I (High Sensitivity): 6 ng/L (ref <18)

## 2020-10-08 MED ORDER — BENAZEPRIL HCL 40 MG PO TABS
40.0000 mg | ORAL_TABLET | Freq: Every day | ORAL | Status: DC
  Administered 2020-10-08: 40 mg via ORAL
  Filled 2020-10-08: qty 1

## 2020-10-08 MED ORDER — ASPIRIN EC 81 MG PO TBEC
81.0000 mg | DELAYED_RELEASE_TABLET | Freq: Every day | ORAL | Status: DC
  Administered 2020-10-08: 81 mg via ORAL
  Filled 2020-10-08: qty 1

## 2020-10-08 MED ORDER — ATORVASTATIN CALCIUM 80 MG PO TABS
80.0000 mg | ORAL_TABLET | Freq: Every day | ORAL | 0 refills | Status: DC
Start: 2020-10-09 — End: 2020-10-08

## 2020-10-08 MED ORDER — ATORVASTATIN CALCIUM 40 MG PO TABS
80.0000 mg | ORAL_TABLET | Freq: Every day | ORAL | Status: DC
  Administered 2020-10-08: 80 mg via ORAL
  Filled 2020-10-08: qty 2

## 2020-10-08 MED ORDER — ACETAMINOPHEN 325 MG PO TABS: 650.0000 mg | ORAL_TABLET | ORAL | Status: DC | PRN

## 2020-10-08 MED ORDER — METOPROLOL SUCCINATE ER 25 MG PO TB24
75.0000 mg | ORAL_TABLET | Freq: Every day | ORAL | Status: DC
  Administered 2020-10-08: 75 mg via ORAL
  Filled 2020-10-08: qty 3

## 2020-10-08 MED ORDER — GABAPENTIN 300 MG PO CAPS
400.0000 mg | ORAL_CAPSULE | Freq: Two times a day (BID) | ORAL | Status: DC
  Administered 2020-10-08: 400 mg via ORAL
  Filled 2020-10-08: qty 1

## 2020-10-08 MED ORDER — ATORVASTATIN CALCIUM 40 MG PO TABS: 40.0000 mg | ORAL_TABLET | Freq: Every day | ORAL | Status: DC

## 2020-10-08 MED ORDER — RIVAROXABAN 20 MG PO TABS
20.0000 mg | ORAL_TABLET | Freq: Every day | ORAL | Status: DC
  Administered 2020-10-08: 20 mg via ORAL
  Filled 2020-10-08: qty 1

## 2020-10-08 MED ORDER — MIRABEGRON ER 50 MG PO TB24
50.0000 mg | ORAL_TABLET | Freq: Every day | ORAL | Status: DC
  Administered 2020-10-08: 50 mg via ORAL
  Filled 2020-10-08: qty 1

## 2020-10-08 MED ORDER — POTASSIUM CHLORIDE 20 MEQ PO PACK
40.0000 meq | PACK | Freq: Once | ORAL | Status: AC
  Administered 2020-10-08: 40 meq via ORAL
  Filled 2020-10-08: qty 2

## 2020-10-08 MED ORDER — METOPROLOL SUCCINATE ER 25 MG PO TB24: 75.0000 mg | ORAL_TABLET | Freq: Every day | ORAL | 0 refills | Status: DC

## 2020-10-08 MED ORDER — UMECLIDINIUM BROMIDE 62.5 MCG/INH IN AEPB
1.0000 | INHALATION_SPRAY | Freq: Every day | RESPIRATORY_TRACT | Status: DC
  Filled 2020-10-08: qty 7

## 2020-10-08 MED ORDER — TIOTROPIUM BROMIDE MONOHYDRATE 18 MCG IN CAPS: 18.0000 ug | ORAL_CAPSULE | Freq: Every day | RESPIRATORY_TRACT | Status: DC

## 2020-10-08 MED ORDER — BENAZEPRIL HCL 20 MG PO TABS
20.0000 mg | ORAL_TABLET | Freq: Every day | ORAL | 0 refills | Status: DC
Start: 2020-10-09 — End: 2020-10-08

## 2020-10-08 MED ORDER — ISOSORBIDE MONONITRATE ER 30 MG PO TB24: 30.0000 mg | ORAL_TABLET | Freq: Every day | ORAL | 0 refills | Status: DC

## 2020-10-08 MED ORDER — FLUTICASONE FUROATE-VILANTEROL 200-25 MCG/INH IN AEPB
1.0000 | INHALATION_SPRAY | Freq: Every day | RESPIRATORY_TRACT | Status: DC
  Filled 2020-10-08: qty 28

## 2020-10-08 MED ORDER — POTASSIUM CHLORIDE CRYS ER 10 MEQ PO TBCR
10.0000 meq | EXTENDED_RELEASE_TABLET | Freq: Two times a day (BID) | ORAL | Status: DC
  Filled 2020-10-08: qty 1

## 2020-10-08 MED ORDER — ISOSORBIDE MONONITRATE ER 30 MG PO TB24
30.0000 mg | ORAL_TABLET | Freq: Every day | ORAL | Status: DC
  Administered 2020-10-08: 30 mg via ORAL
  Filled 2020-10-08: qty 1

## 2020-10-08 MED ORDER — METOPROLOL SUCCINATE ER 25 MG PO TB24: 50.0000 mg | ORAL_TABLET | Freq: Every day | ORAL | Status: DC

## 2020-10-08 MED ORDER — TORSEMIDE 20 MG PO TABS
40.0000 mg | ORAL_TABLET | Freq: Every day | ORAL | Status: DC
  Administered 2020-10-08: 40 mg via ORAL
  Filled 2020-10-08: qty 2

## 2020-10-08 MED ORDER — BENAZEPRIL HCL 20 MG PO TABS: 20.0000 mg | ORAL_TABLET | Freq: Every day | ORAL | Status: DC

## 2020-10-08 MED ORDER — OXYCODONE HCL 5 MG PO TABS
15.0000 mg | ORAL_TABLET | Freq: Four times a day (QID) | ORAL | Status: DC | PRN
  Administered 2020-10-08: 15 mg via ORAL
  Filled 2020-10-08: qty 3

## 2020-10-08 MED ORDER — PANTOPRAZOLE SODIUM 40 MG PO TBEC
40.0000 mg | DELAYED_RELEASE_TABLET | Freq: Every day | ORAL | Status: DC
  Administered 2020-10-08: 40 mg via ORAL
  Filled 2020-10-08: qty 1

## 2020-10-08 MED ORDER — IPRATROPIUM-ALBUTEROL 0.5-2.5 (3) MG/3ML IN SOLN
3.0000 mL | Freq: Four times a day (QID) | RESPIRATORY_TRACT | Status: DC | PRN
  Administered 2020-10-08: 3 mL via RESPIRATORY_TRACT
  Filled 2020-10-08: qty 3

## 2020-10-08 NOTE — ED Notes (Signed)
Patient able to sit up at side of bed with no issues, eating breakfast at this time

## 2020-10-08 NOTE — H&P (Deleted)
Date: 10/08/2020               Patient Name:  Sharon Vasquez [name]: [id number]  DOB: Apr 17, 1950 Age / Sex: 70 y.o., female   PCP: [name], DO         Medical Service: Internal Medicine Teaching Service         Attending Physician: Dr. Evette Doffing, Mallie Mussel, *    First Contact: Dr. Ileene Musa Pager: [phone]  Second Contact: Dr. Gilford Rile Pager: [phone]       After Hours (After 5p/  First Contact Pager: [phone]  weekends / holidays): Second Contact Pager: [phone]   Chief Complaint: Chest pain  History of Present Illness: Sharon Vasquez is a 70 year old with past medical history of CHF, A fib on anticoagulation, COPD (on 2L at baseline), HTN, OSA, GERD, and Peripheral vascular disease with Left lower extremity venous stasis ulcer.  She presents with central chest pain that started around 1800 7/7 with pain in her right breast.  At first she thought this was due to indigestion, but pain gradually worsened to 10/10 pain that moved into the central part of her chest.  Her husband called EMS and she was given nitroglycerin, which relieved the pain to 0/10 with a residual pressure sensation in central chest and a headache.  Patient states that she was diagnosed with A fib in 2019 following episodes of shortness of breath, she has never had a cardiac catheterization to her knowledge.   Denies fever, chills, constipation, or nausea.  Her chest pain has improved since the nitroglycerin and she does have frequent episodes of shortness of breath with ADLs at baseline.  Meds:  Current Meds  Medication Sig   albuterol (VENTOLIN HFA) 108 (90 Base) MCG/ACT inhaler Inhale into the lungs.   aspirin EC 81 MG tablet Take 81 mg by mouth daily.   atorvastatin (LIPITOR) 40 MG tablet Take 1 tablet (40 mg total) by mouth daily.   benazepril (LOTENSIN) 40 MG tablet Take 0.5 tablets (20 mg total) by mouth daily. (Patient taking differently: Take 40 mg by mouth daily.)   esomeprazole (NEXIUM) 40 MG capsule Take  1 capsule (40 mg total) by mouth daily as needed (for heartburn or indigestion). (Patient taking differently: Take 40 mg by mouth daily.)   Fluticasone-Salmeterol (ADVAIR) 250-50 MCG/DOSE AEPB Inhale 1 puff into the lungs 2 (two) times daily.   gabapentin (NEURONTIN) 400 MG capsule TAKE 1 CAPSULE BY MOUTH  TWICE DAILY (Patient taking differently: Take 400 mg by mouth 2 (two) times daily.)   hydrochlorothiazide (HYDRODIURIL) 50 MG tablet Take 1 tablet (50 mg total) by mouth daily. (Patient taking differently: Take 50 mg by mouth daily as needed (high blood pressure  over 150).)   ibuprofen (ADVIL) 200 MG tablet Take 800 mg by mouth every 6 (six) hours as needed (inflammation).   ipratropium-albuterol (DUONEB) 0.5-2.5 (3) MG/3ML SOLN Take 3 mLs by nebulization every 6 (six) hours as needed. (Patient taking differently: Take 3 mLs by nebulization every 6 (six) hours as needed (shortness of breath/wheezing).)   metoprolol succinate (TOPROL-XL) 50 MG 24 hr tablet TAKE 1 TABLET(50 MG) BY MOUTH DAILY WITH OR IMMEDIATELY FOLLOWING A MEAL (Patient taking differently: Take 50 mg by mouth daily.)   mirabegron ER (MYRBETRIQ) 50 MG TB24 tablet Take 1 tablet (50 mg total) by mouth daily.   oxyCODONE (ROXICODONE) 15 MG immediate release tablet Take 15 mg by mouth 4 (four) times daily as needed for pain.  OXYGEN Inhale 2 L into the lungs as needed (shortness of breath).   potassium chloride (KLOR-CON) 10 MEQ tablet Take 1 tablet (10 mEq total) by mouth 2 (two) times daily.   PRESCRIPTION MEDICATION Inhale into the lungs at bedtime. cpap   rivaroxaban (XARELTO) 20 MG TABS tablet Take 1 tablet (20 mg total) by mouth every morning.   tiotropium (SPIRIVA HANDIHALER) 18 MCG inhalation capsule PLACE 1 CAPSULE INTO INHALER AND INHALE DAILY (Patient taking differently: Place 18 mcg into inhaler and inhale daily.)   torsemide (DEMADEX) 20 MG tablet TAKE 2 TABLETS BY MOUTH DAILY (Patient taking differently: Take 40 mg by mouth  daily.)     Allergies: Allergies as of 10/07/2020   (No Known Allergies)   Past Medical History:  Diagnosis Date   A-fib ([place])    Anxiety    Arthritis    "qwhre; joints, back" (04/17/2017)   Benign breast cyst in female, left 01/08/2017   Found by Screening mammogram, evaluated by U/S on 01/08/17 and determined to be a benign simple breast cyst.   Cellulitis of left lower leg 05/30/2017   CHF (congestive heart failure) (HCC)    Chronic low back pain 08/21/2016   Chronic lower back pain    Chronic venous insufficiency    /notes 05/30/2017   COPD (chronic obstructive pulmonary disease) (HCC)    Depression    DVT (deep venous thrombosis) ([place]) 11/16/2016   GERD (gastroesophageal reflux disease)    Headache    "weekly for the last 3 months" (04/17/2017)   Hyperlipidemia    Hypertension    Morbid obesity ([place])    PE (pulmonary embolism)    Pulmonary embolism ([place]) 09/21/2014   Sleep apnea     Family History:  Family History  Problem Relation Age of Onset   Breast cancer Mother    Hypertension Mother    Hyperlipidemia Mother    Hypertension Maternal Grandfather    Hyperlipidemia Maternal Grandfather      Social History: Patient lives alone in [place], in a senior living apartment.  She is separated from her husband, but they live in the same apartment building and remain friends. She uses a manual wheelchair in the apartment and has a motorized wheelchair as well.  Uses public transportation and is unable to drive herself.  Her grandson checks on her frequently. Does not use tobacco products.  Enjoys going to church when she feels up to it.  Review of Systems: A complete ROS was negative except as per HPI.   Physical Exam: Blood pressure 137/83, pulse (!) 104, temperature 98.5 F (36.9 C), temperature source Oral, resp. rate (!) 22, height 6\' 2"  (1.88 m), weight (!) 172.4 kg, SpO2 96 %. Gen: Well-developed, obese woman, in no acute distress HENT: NCAT Eyes: Conjunctiva  clear, sclera non-icteric Heart: Irregular rhythm, no murmurs Lungs: Expiratory wheezes bilaterally, normal pulmonary effort Abd: normal bowel sounds, no tenderness to palpation Extrm: chronic venous stasis ulcer on left lower extremity, 3+ edema bilateral lower extremities with clear drainage present Skin: seborrheic keratosis on face bilaterally, skin on legs bilaterally is hardened Psych: Oriented x3, normal mood and affect  EKG: Atrial fibrillation with RVR, prolonged Qtc of 540  CXR:  IMPRESSION: Cardiac enlargement with mild interstitial changes in the lungs, likely early edema.  Assessment & Plan by Problem: Active Problems:   Chest pain Ms. Wonder is a 70 year old with past medical history of CHF, A fib on anticoagulation, COPD (on 2L at baseline), HTN, OSA,  GERD, and Peripheral vascular disease with Left lower extremity venous stasis ulcer.  Patient admitted due to chest pain, cardiology following.  Chest pain Troponin wnl (7) on presentation and 7 on repeat. EKG: No ST changes, Atrial fibrillation with prolonged Qtc as seen in previous EKGs. -CT Coronary 05/05/2018 showed significant stenosis in the proximal portion of a small non-dominant RCA. No interventions performed at that time. -Cardiology consulted by ED physician. They do not plan to cardiac catheretization at this time. -Cardiology ordered echo, will f/u on that.   Atrial Fibrillation Diagnosed in 2019, CHAD2VASC score of 5 (age, female, CHF, HTN, Vascular disease hx) on Xarelto 20 mg at home. -Will continue xarelto, cardiology not currently planning to do cath  COPD On albuterol inhaler, Advair, Duoneb prn, Spiriva and 2L of oxygen at baseline  She was admitted to [facility] on 6/18 due to a COPD exacerbation and was treated with IV lasix, steroids, and breathing treatments.  She wears CPAP at night. -CPAP at night, continued home medications  CHF TTE done during last admission 6/18 showed EF>55%.  In ED,  BNP=75.  Patient's weight is 172.4 kg, down from 178.4 kg.   -Monitor for fluid retention, restarted torsemide home medication -Cardiology ordered echo, will f/u on that.  Peripheral Vascular Disease with chronic left lower extremity ulcer History of stent placed in left leg a few years ago.  ABI performed 11/30/2016 wnl.  Patient has venous stasis ulcer on anterolateral aspect of left lower extremity.  Has difficulty with dressing the wound herself, follows with [facility] for this and seen 10/07/20 in clinic. -consulted wound care  HTN On Benazepril 40 mg once daily, HCTZ 50 mg prn if BP >150, metoprolol succinate 50 mg, and Torsemide 20 mg daily. -started home medications  GERD On Esomeprazole 40 mg prn at home -started home medication  Urinary incontinence On mirabegron ER 50 mg at home -started home medication  Hypokalemia On Klor-Con 10 BID at home. At ED presentation K= 3.1 -Will repeat BMP, will follow up -Ordered 40Meq of potassium chloride, will adjust as needed.   Osteoarthritis of Knees and left hip On Gabapentin 400 mg BID and Roxicodone 15 mg.  Ortho and pain management consulted by [facility] PCP.  Patient stated that she has been evaluated by ortho in the past and they told her with the severity of her peripheral vascular disease, they did not think she was a surgical candidate.  Dispo: Admit patient to Observation with expected length of stay less than 2 midnights.  Signed: Christiana Fuchs, DO 10/08/2020, 12:12 AM  Pager: [phone] After 5pm on weekdays and 1pm on weekends: On Call pager: [phone]

## 2020-10-08 NOTE — Discharge Summary (Addendum)
Name: Sharon Vasquez [name]: [id number] DOB: June 18, 1950 70 y.o. PCP: [name], DO  Date of Admission: 10/07/2020  8:05 PM Date of Discharge: 10/08/2020 Attending Physician: Axel Filler, *  Discharge Diagnosis: 1. Atypical chest pain 2. Chronic Diastolic CHF 3. Chronic Venous Insufficiency with Left leg ulcer 4. PAD 5. Hypertension 6. Hyperlipidemia   Discharge Medications: Allergies as of 10/08/2020   No Known Allergies      Medication List     STOP taking these medications    ibuprofen 200 MG tablet Commonly known as: ADVIL       TAKE these medications    albuterol 108 (90 Base) MCG/ACT inhaler Commonly known as: VENTOLIN HFA Inhale into the lungs.   aspirin EC 81 MG tablet Take 81 mg by mouth daily.   atorvastatin 80 MG tablet Commonly known as: LIPITOR Take 1 tablet (80 mg total) by mouth daily. Start taking on: October 09, 2020 What changed:  medication strength how much to take   benazepril 20 MG tablet Commonly known as: LOTENSIN Take 1 tablet (20 mg total) by mouth daily. Start taking on: October 09, 2020 What changed: medication strength   Fluticasone-Salmeterol 250-50 MCG/DOSE Aepb Commonly known as: ADVAIR Inhale 1 puff into the lungs 2 (two) times daily.   isosorbide mononitrate 30 MG 24 hr tablet Commonly known as: IMDUR Take 1 tablet (30 mg total) by mouth daily. Start taking on: October 09, 2020   metoprolol succinate 25 MG 24 hr tablet Commonly known as: TOPROL-XL Take 3 tablets (75 mg total) by mouth daily. Start taking on: October 09, 2020 What changed:  medication strength See the new instructions.   mirabegron ER 50 MG Tb24 tablet Commonly known as: MYRBETRIQ Take 1 tablet (50 mg total) by mouth daily.   oxyCODONE 15 MG immediate release tablet Commonly known as: ROXICODONE Take 15 mg by mouth 4 (four) times daily as needed for pain.   OXYGEN Inhale 2 L into the lungs as needed (shortness of breath).   potassium  chloride 10 MEQ tablet Commonly known as: KLOR-CON Take 1 tablet (10 mEq total) by mouth 2 (two) times daily.   PRESCRIPTION MEDICATION Inhale into the lungs at bedtime. cpap   rivaroxaban 20 MG Tabs tablet Commonly known as: XARELTO Take 1 tablet (20 mg total) by mouth every morning.       ASK your doctor about these medications    esomeprazole 40 MG capsule Commonly known as: NEXIUM Take 1 capsule (40 mg total) by mouth daily as needed (for heartburn or indigestion).   gabapentin 400 MG capsule Commonly known as: NEURONTIN TAKE 1 CAPSULE BY MOUTH  TWICE DAILY   hydrochlorothiazide 50 MG tablet Commonly known as: HYDRODIURIL Take 1 tablet (50 mg total) by mouth daily.   ipratropium-albuterol 0.5-2.5 (3) MG/3ML Soln Commonly known as: DUONEB Take 3 mLs by nebulization every 6 (six) hours as needed.   Spiriva HandiHaler 18 MCG inhalation capsule Generic drug: tiotropium PLACE 1 CAPSULE INTO INHALER AND INHALE DAILY   torsemide 20 MG tablet Commonly known as: DEMADEX TAKE 2 TABLETS BY MOUTH DAILY        Disposition and follow-up:   Sharon Vasquez was discharged from [facility] in Stable condition.  At the hospital follow up visit please address:  1.  CHF/CAD- statin increased to 80 mg,   2. Atrial Fibrillation- toprolol xL increased to 75 mg daily  3. Benzapril discontinued  2.  Labs / imaging needed  at time of follow-up: BMP  3.  Pending labs/ test needing follow-up: None  Follow-up Appointments:  Follow-up [facility] Follow up.   Specialty: Cardiology Why: Hospital follow-up with Cardiology scheduled for 10/22/2020 at 2pm at our [place] office. Please arrive 15 minutes early for check-in. If this date/time does not work for you, please call our office to reschedule. Contact information: [address] [place] [id number] [place] [facility] Course by problem list: 1.  Atypical Chest pain, Coronary Artery Disease, Hypertension Patient presented with substernal chest pressure after becoming upset. Chest pain resolved with nitroglycerin vs. Time, and was gone by time of admission. No ischemic changes were seen on EKG and troponins were WNL. Echo performed showed no wall motion abnormalities and roughly unchanged EF. Cardiology was consulted and recommended discontinuing lisinopril to allow for more antianginal coverage, started isosorbide mononitrate 30 mg daily. Toprol XL increased to 75 mg daily and benzepril decreased to 20 mg daily. Pain thought to be more stress/musculoskeletal in nature and patient deemed stable for discharge.  Atrial Fibrillation Patient relatively well rate controlled, toprol XL incrased to 75 mg daily. Continued home xarelto 30 mg daily.  Chronic Diastolic CHF BNP within normal limits, Chest Xray with cardiac enlargement and mild intersitial changes possibly suggestive of early edema. LV function was normal on echocardiogram. Volume status exam limited due to body habitus but did not appear to be grossly volume overloaded. Continued on home torsemide 40 mg daily.  PAD/Hyperlipidemia History of left common and external ilia stenting. Continued aspirin and increased statin to 80 mg.  Chronic Venous Insufficiency with Left Leg Ulcer Refused unna boots in the past, followed by internal medicine outpatient and has been referred to vascular surgery and wound care in the past  Hypokalemia Repleted during admission.  COPD, ILD with chronic hypoxic respiratory failure On albuterol inhaler, Advair, Duoneb prn, Spiriva and 2L of oxygen at baseline, continued home medications   OSA Continued CPAP   GERD Continued Esomeprazole 40 mg prn    Urinary incontinence Continued home mirabegron ER 50 mg   Morbid obesity  Outpatient dietary management  Osteoarthritis of Knees and left hip On Gabapentin  400 mg BID and Roxicodone 15 mg which were continued.   Discharge Exam:   BP (!) 186/101   Pulse 100   Temp 98.5 F (36.9 C) (Oral)   Resp (!) 22   Ht 6\' 2"  (1.88 m)   Wt (!) 172.4 kg   SpO2 95%   BMI 48.79 kg/m  Discharge exam:  Gen: Obese appearing woman in NAD Heart: irregularly irregular rhythym Resp: expiratory wheezing Abd: soft, distended Skin: Chronic venous stasis skin changes to the bilateral lower extremities, hyperkeratosis present, LLE with open skin erosion   DG Chest 2 View  Result Date: 10/07/2020 CLINICAL DATA:  Chest pain and shortness of breath. History of pulmonary edema. EXAM: CHEST - 2 VIEW COMPARISON:  07/24/2020 FINDINGS: Mild cardiac enlargement. Mild interstitial changes suggested in the lungs likely mild edema. Similar appearance to prior study. No focal consolidation. No pleural effusions. No pneumothorax. Mediastinal contours appear intact. Degenerative changes in the spine. IMPRESSION: Cardiac enlargement with mild interstitial changes in the lungs, likely early edema. Electronically Signed   By: Lucienne Capers M.D.   On: 10/07/2020 22:09   ECHOCARDIOGRAM COMPLETE  Result Date: 10/08/2020    ECHOCARDIOGRAM REPORT  Patient Name:   BETHANEE REDONDO Date of Exam: 10/08/2020 Medical Rec #:  [id number]      Height:       74.0 in Accession #:    [id number]     Weight:       380.0 lb Date of Birth:  01-Oct-1950      BSA:          2.855 m Patient Age:    16 years       BP:           120/74 mmHg Patient Gender: F              HR:           93 bpm. Exam Location:  Inpatient Procedure: 2D Echo, Cardiac Doppler and Color Doppler Indications:    Chest pain  History:        Patient has prior history of Echocardiogram examinations, most                 recent 09/30/2019. CHF, COPD; Risk Factors:Hypertension.  Sonographer:    Cammy Brochure Referring Phys: [id number] Renae Fickle  Sonographer Comments: Patient is morbidly obese. Image acquisition challenging due to patient body  habitus. IMPRESSIONS  1. Left ventricular ejection fraction, by estimation, is 60 to 65%. The left ventricle has normal function. The left ventricle has no regional wall motion abnormalities. The left ventricular internal cavity size was mildly dilated. There is moderate left ventricular hypertrophy. Left ventricular diastolic parameters are indeterminate.  2. Right ventricular systolic function is normal. The right ventricular size is mildly enlarged. There is mildly elevated pulmonary artery systolic pressure. The estimated right ventricular systolic pressure is 22.0 mmHg.  3. Left atrial size was mildly dilated.  4. Right atrial size was mildly dilated.  5. The mitral valve is normal in structure. No evidence of mitral valve regurgitation.  6. Tricuspid valve regurgitation is mild to moderate.  7. The aortic valve is tricuspid. Aortic valve regurgitation is not visualized. Mild to moderate aortic valve sclerosis/calcification is present, without any evidence of aortic stenosis.  8. The inferior vena cava is dilated in size with >50% respiratory variability, suggesting right atrial pressure of 8 mmHg. FINDINGS  Left Ventricle: Left ventricular ejection fraction, by estimation, is 60 to 65%. The left ventricle has normal function. The left ventricle has no regional wall motion abnormalities. The left ventricular internal cavity size was mildly dilated. There is  moderate left ventricular hypertrophy. Left ventricular diastolic parameters are indeterminate. Right Ventricle: The right ventricular size is mildly enlarged. No increase in right ventricular wall thickness. Right ventricular systolic function is normal. There is mildly elevated pulmonary artery systolic pressure. The tricuspid regurgitant velocity is 2.69 m/s, and with an assumed right atrial pressure of 8 mmHg, the estimated right ventricular systolic pressure is 25.4 mmHg. Left Atrium: Left atrial size was mildly dilated. Right Atrium: Right atrial size  was mildly dilated. Pericardium: Trivial pericardial effusion is present. Mitral Valve: The mitral valve is normal in structure. No evidence of mitral valve regurgitation. MV peak gradient, 7.4 mmHg. The mean mitral valve gradient is 3.0 mmHg. Tricuspid Valve: The tricuspid valve is normal in structure. Tricuspid valve regurgitation is mild to moderate. Aortic Valve: The aortic valve is tricuspid. Aortic valve regurgitation is not visualized. Mild to moderate aortic valve sclerosis/calcification is present, without any evidence of aortic stenosis. Aortic valve mean gradient measures 4.0 mmHg. Aortic valve peak gradient measures 7.3 mmHg. Aortic valve area, by  VTI measures 3.73 cm. Pulmonic Valve: The pulmonic valve was not well visualized. Pulmonic valve regurgitation is not visualized. Aorta: The aortic root and ascending aorta are structurally normal, with no evidence of dilitation. Venous: The inferior vena cava is dilated in size with greater than 50% respiratory variability, suggesting right atrial pressure of 8 mmHg. IAS/Shunts: The interatrial septum was not well visualized.  LEFT VENTRICLE PLAX 2D LVIDd:         5.60 cm  Diastology LVIDs:         3.50 cm  LV e' medial:    8.59 cm/s LV PW:         1.20 cm  LV E/e' medial:  15.5 LV IVS:        1.50 cm  LV e' lateral:   11.40 cm/s LVOT diam:     2.40 cm  LV E/e' lateral: 11.7 LV SV:         98 LV SV Index:   34 LVOT Area:     4.52 cm  RIGHT VENTRICLE             IVC RV Basal diam:  4.40 cm     IVC diam: 3.10 cm RV S prime:     11.70 cm/s TAPSE (M-mode): 2.9 cm LEFT ATRIUM            Index       RIGHT ATRIUM           Index LA diam:      4.10 cm  1.44 cm/m  RA Area:     26.60 cm LA Vol (A2C): 109.0 ml 38.18 ml/m RA Volume:   84.00 ml  29.42 ml/m LA Vol (A4C): 83.3 ml  29.18 ml/m  AORTIC VALVE AV Area (Vmax):    3.99 cm AV Area (Vmean):   3.52 cm AV Area (VTI):     3.73 cm AV Vmax:           135.00 cm/s AV Vmean:          96.700 cm/s AV VTI:             0.262 m AV Peak Grad:      7.3 mmHg AV Mean Grad:      4.0 mmHg LVOT Vmax:         119.00 cm/s LVOT Vmean:        75.200 cm/s LVOT VTI:          0.216 m LVOT/AV VTI ratio: 0.82  AORTA Ao Root diam: 3.30 cm Ao Asc diam:  3.30 cm MITRAL VALVE                TRICUSPID VALVE MV Area (PHT): 5.02 cm     TR Peak grad:   28.9 mmHg MV Area VTI:   4.21 cm     TR Vmax:        269.00 cm/s MV Peak grad:  7.4 mmHg MV Mean grad:  3.0 mmHg     SHUNTS MV Vmax:       1.36 m/s     Systemic VTI:  0.22 m MV Vmean:      73.7 cm/s    Systemic Diam: 2.40 cm MV Decel Time: 151 msec MV E velocity: 133.00 cm/s MV A velocity: 71.90 cm/s MV E/A ratio:  1.85 Oswaldo Milian MD Electronically signed by Oswaldo Milian MD Signature Date/Time: 10/08/2020/10:32:28 AM    Final    CBC Latest Ref Rng & Units 10/07/2020 07/24/2020 04/15/2020  WBC 4.0 - 10.5 K/uL 6.0 5.7 9.2  Hemoglobin 12.0 - 15.0 g/dL 13.0 12.8 11.3(L)  Hematocrit 36.0 - 46.0 % 39.6 39.0 32.9(L)  Platelets 150 - 400 K/uL 364 394 405(H)   Lab Results  Component Value Date   NA 140 10/08/2020   K 3.3 (L) 10/08/2020   CO2 25 10/08/2020   GLUCOSE 119 (H) 10/08/2020   BUN 12 10/08/2020   CREATININE 0.98 10/08/2020   CALCIUM 8.7 (L) 10/08/2020   GFRNONAA >60 10/08/2020   GFRAA 79 10/23/2019    Troponin I (High Sensitivity) <18 ng/L 6  7 CM  7 CM     Discharge Instructions:   Signed: Scarlett Presto, MD 10/08/2020, 11:45 AM   Pager: [phone]

## 2020-10-08 NOTE — Discharge Instructions (Addendum)
Sharon Vasquez  You were recently seen at [facility] for chest pain. We did some tests to determine that you did not have a heart attack and your pain resolved with medical management. A video of your heart was taken and it was determined that the function of your heart is stable from the pictures taken a year ago. Your medications were kept the same except for the following changes below.  Decrease your Benzepril (lotensin) to 20 mg daily Start taking isosorbide mononitrate (imdur) 30 mg daily  Increase your atorvastatin (lipitor) to 80 mg daily  Increase your metoprolol succinate (toprol-xL) to 75 mg daily\  We recommend that you follow up with your primary care provider in one week.

## 2020-10-08 NOTE — Progress Notes (Addendum)
Progress Note  Patient Name: Sharon Vasquez Date of Encounter: 10/08/2020  [facility] Cardiologist: Quay Burow, MD   Subjective   Patient admitted overnight for chest pain. Pain very atypical. Started on "right breast" and then radiated to left. Initially felt like indigestion. Only lasted for a couple seconds and then resolved. This occurred intermittent only last a couple of seconds each time. Massaging breast helped; however, pain also resolved with Nitro. Patient continues to have shortness of breath largely due to COPD/interstitial lung disease. She was recently treated for COPD exacerbation and states breathing has not improved much since then. Lower extremity stable.  Currently chest pain free this morning. However, she is having diffuse pain in her back, legs, and shoulder.  Inpatient Medications    Scheduled Meds:  aspirin EC  81 mg Oral Daily   atorvastatin  40 mg Oral Daily   benazepril  40 mg Oral Daily   fluticasone furoate-vilanterol  1 puff Inhalation Daily   gabapentin  400 mg Oral BID   isosorbide mononitrate  30 mg Oral Daily   metoprolol succinate  75 mg Oral Daily   mirabegron ER  50 mg Oral Daily   pantoprazole  40 mg Oral Daily   rivaroxaban  20 mg Oral Q breakfast   tiotropium  18 mcg Inhalation Daily   torsemide  40 mg Oral Daily   Continuous Infusions:  PRN Meds: acetaminophen, oxyCODONE   Vital Signs    Vitals:   10/08/20 0218 10/08/20 0430 10/08/20 0445 10/08/20 0500  BP: 129/90   117/86  Pulse: 87 89  86  Resp: (!) 24 20 20 20   Temp:      TempSrc:      SpO2: 97% 98%  97%  Weight:      Height:       No intake or output data in the 24 hours ending 10/08/20 0645 Last 3 Weights 10/07/2020 10/07/2020 09/09/2020  Weight (lbs) 380 lb 382 lb 4.8 oz 393 lb 6.4 oz  Weight (kg) 172.367 kg 173.41 kg 178.445 kg      Telemetry    Atrial fibrillation with rates in the 80s to low 100s. A couple of missed beats but no pauses >2 seconds. -  Personally Reviewed  ECG    No new ECG tracing today. - Personally Reviewed  Physical Exam   GEN: Morbidly obese African-American female. No acute distress.   Neck: JVD difficult to assess due to body habitus. Cardiac: Irregularly irregular rhythm. No murmurs, rubs, or gallops.  Respiratory: No significant increased work of breathing. Diffuse wheezing throughout. No significant crackles appreciated. GI: Soft, obese, and non-tender. MS: Chronic lower extremity edema. No deformity. Skin: Warm and dry. Ulcer on left leg due to chronic venous stasis. Neuro:  No focal deficits. Psych: Normal affect. Responds appropriately.  Labs    High Sensitivity Troponin:   Recent Labs  Lab 10/07/20 2044 10/07/20 2244  TROPONINIHS 7 7      Chemistry Recent Labs  Lab 10/07/20 2044  NA 137  K 3.1*  CL 102  CO2 24  GLUCOSE 140*  BUN 12  CREATININE 1.22*  CALCIUM 8.7*  PROT 7.0  ALBUMIN 3.7  AST 20  ALT 17  ALKPHOS 72  BILITOT 0.9  GFRNONAA 47*  ANIONGAP 11     Hematology Recent Labs  Lab 10/07/20 2044  WBC 6.0  RBC 4.57  HGB 13.0  HCT 39.6  MCV 86.7  MCH 28.4  MCHC 32.8  RDW 14.2  PLT 364    BNP Recent Labs  Lab 10/07/20 2044  BNP 75.0     DDimer No results for input(s): DDIMER in the last 168 hours.   Radiology    DG Chest 2 View  Result Date: 10/07/2020 CLINICAL DATA:  Chest pain and shortness of breath. History of pulmonary edema. EXAM: CHEST - 2 VIEW COMPARISON:  07/24/2020 FINDINGS: Mild cardiac enlargement. Mild interstitial changes suggested in the lungs likely mild edema. Similar appearance to prior study. No focal consolidation. No pleural effusions. No pneumothorax. Mediastinal contours appear intact. Degenerative changes in the spine. IMPRESSION: Cardiac enlargement with mild interstitial changes in the lungs, likely early edema. Electronically Signed   By: Lucienne Capers M.D.   On: 10/07/2020 22:09    Cardiac Studies   Coronary CTA  05/07/2018: Impressions: 1. Coronary calcium score of 2110. This was 56 percentile for age and sex matched control. 2. Normal coronary origin with left dominance. 3. Moderate to severe diffuse CAD with stenosis 50-69% in the proximal LAD, distal LCX arteries and suspicion for a severe stenosis in the mid portion of the 1. diagonal artery. Additional analysis with CT FFR will be submitted. 4. Moderately dilated pulmonary artery measuring 37 mm suspicious of pulmonary hypertension.  FFRct analysis was performed on the original cardiac CT angiogram dataset. Diagrammatic representation of the FFRct analysis is provided in a separate PDF document in PACS. This dictation was created using the PDF document and an interactive 3D model of the results. 3D model is not available in the EMR/PACS. Normal FFR range is >0.80.   1. Left Main:  No significant stenosis. 2. LAD: No significant stenosis. 3. D1: No significant stenosis. 4. Ramus intermedius: No significant stenosis. 5. LCX: No significant stenosis. 6. RCA: Proximal: 1.0, mid 0.61.   Impressions: 1. CT FFR analysis showed significant stenosis in the proximal portion of a small non-dominant RCA. An aggressive medical management is recommended.  Patient Profile     70 y.o. female with a history of CAD with positive FFR noted on small non-dominant RCA on coronary CTA in 05/2018 (treated medically), chronic diastolic CHF, persistent atrial fibrillation on Xarelto, PAD, COPD, obstructive sleep apnea on CPAP, hypertension, hyperlipidemia, GERD, and chronic back pain who was admitted on 10/07/2020 for chest pain.  Assessment & Plan    Chest Pain History of CAD - Patient presents with atypical chest pain at rest. Only last for a couple of seconds. Improved with massaging/palpation of chest. However, also resolved with Nitro. - Coronary CTA in 05/2018 showed single vessel obstructive CAD with positive FFR lesion in a  small non-dominant RCA.  Otherwise, no hemodynamically significant lesions. Medical therapy was recommended.  - EKG showed atrial fibrillation with non-specific ST/T changes.  - High-sensitivity troponin negative x3.  - Echo pending.  - Currently chest pain free. - Imdur 30mg  daily has been added. - Toprol-XL increased to 75mg  daily. - Continue Aspirin 81mg  daily and Lipitor 40mg  daily. - Patient has typical and atypical symptoms. She does have known CAD but I don't think symptoms represented CAD. Possible musculoskeletal component given improvement with palpation/massaging of chest wall. Also may be secondary to significant lung disease. Spoke with Dr. Evette Doffing who is very familiar with patient and feels like medical therapy may be best for her given multiple other comorbidities and overall clinical picture. I agree with this. I think we can see how she does with Imdur and increase in Toprol-XL. BP soft this morning. Recommend stopping ACEi so we can  maximize antianginals if needed. Will discuss with MD.  Persistent Atrial Fibrillation - Relatively rate controlled. - Toprol-XL increased to 75mg  daily. - Continue Xarelto 20mg  daily.   Chronic Diastolic CHF - BNP normal. - Chest x-ray showed cardiac enlargement with mild interstitial changes in the lungs felt to likely be early edema.  - LV function normal in 09/2019. Updated Echo pending.  - Volume status difficult to assess due to body habitus but does not appear grossly volume overloaded. - Continue Torsemide 40mg  daily. - Continue to monitor daily weights, strict I/Os, and renal function.  Chronic Venous Insufficiency with Left Leg Ulcer - Declined UNNA boots in the past.  - Continue Torsemide. - Internal medicine service has been following this closely. She has been referred to Vascular Surgery and Wound Care.  PAD - History of left common and external ilia stenting. - Continue aspirin and statin.  Hypertension - BP soft this morning but stable. Systolic  BP in the 83F. - Continue Toprol-XL 75mg  daily and Imdur 30mg  daily as above. - Stop Benazepril to allow for antianginals.  Hyperlipidemia - LDL 81 in 05/2020. - LDL goal <70 given CAD. - Will increase Lipitor to 80mg  dialy. - Will need repeat lipid panel/LFTs in 6-8 weeks.  Hypokalemia - Potassium 3.1 on admission and 3.3 today. - Primary team supplementing.   Otherwise, per primary team: - COPD/ interstitial lung disease with chronic respiratory failure on O2 - Chronic back pain - Osteoarthritis of knees and left hip - Urinary incontinence  - GERD  For questions or updates, please contact [facility] Please consult [email or web address] for contact info under        Signed, Darreld Mclean, PA-C  10/08/2020, 6:45 AM   As above, patient seen and examined.  She has not had recurrent chest pain.  Her symptoms were somewhat atypical lasting 1 minute, resolving and then recurring.  She has had no recurrent symptoms since admission.  Her enzymes are negative.  Electrocardiogram with subtle increase in lateral ST changes.  I discussed stress nuclear study with her today but she would prefer medical therapy and avoiding stress test due to anxiety.  We will therefore plan medical therapy. Continue aspirin.  Increase Lipitor to 80 mg daily.  Check lipids and liver in 12 weeks.  Will increase Toprol for improved rate control and continue Imdur.  Blood pressure has been borderline.  Decrease benazepril to 20 mg daily and follow.  Continue Xarelto for atrial fibrillation.  Follow-up echocardiogram pending.  If LV function unchanged patient can be discharged and follow-up with Dr. Gwenlyn Found.  Kirk Ruths, MD

## 2020-10-08 NOTE — H&P (Addendum)
Date: 10/08/2020               Patient Name:  Sharon Vasquez [name]: [id number]  DOB: 10-25-1950 Age / Sex: 70 y.o., female   PCP: [name], DO         Medical Service: Internal Medicine Teaching Service         Attending Physician: Dr. Evette Doffing, Mallie Mussel, *    First Contact: Dr. Ileene Musa Pager: [phone]  Second Contact: Dr. Gilford Rile Pager: [phone]       After Hours (After 5p/  First Contact Pager: [phone]  weekends / holidays): Second Contact Pager: [phone]   Chief Complaint: Chest pain  History of Present Illness: Ms. Bober is a 70 year old with past medical history of CHF, A fib on anticoagulation, COPD (on 2L at baseline), HTN, OSA, GERD, and Peripheral vascular disease with Left lower extremity venous stasis ulcer.  She presents with central chest pain that started around 1800 7/7 with pain in her right breast.  At first she thought this was due to indigestion, but pain gradually worsened to 10/10 pain that moved into the central part of her chest.  Her husband called EMS and she was given nitroglycerin, which relieved the pain to 0/10 with a residual pressure sensation in central chest and a headache.  Patient states that she was diagnosed with A fib in 2019 following episodes of shortness of breath, she has never had a cardiac catheterization to her knowledge.   Denies fever, chills, constipation, or nausea.  Her chest pain has improved since the nitroglycerin and she does have frequent episodes of shortness of breath with ADLs at baseline.  Meds:  Current Meds  Medication Sig   albuterol (VENTOLIN HFA) 108 (90 Base) MCG/ACT inhaler Inhale into the lungs.   aspirin EC 81 MG tablet Take 81 mg by mouth daily.   atorvastatin (LIPITOR) 40 MG tablet Take 1 tablet (40 mg total) by mouth daily.   benazepril (LOTENSIN) 40 MG tablet Take 0.5 tablets (20 mg total) by mouth daily. (Patient taking differently: Take 40 mg by mouth daily.)   esomeprazole (NEXIUM) 40 MG capsule Take  1 capsule (40 mg total) by mouth daily as needed (for heartburn or indigestion). (Patient taking differently: Take 40 mg by mouth daily.)   Fluticasone-Salmeterol (ADVAIR) 250-50 MCG/DOSE AEPB Inhale 1 puff into the lungs 2 (two) times daily.   gabapentin (NEURONTIN) 400 MG capsule TAKE 1 CAPSULE BY MOUTH  TWICE DAILY (Patient taking differently: Take 400 mg by mouth 2 (two) times daily.)   hydrochlorothiazide (HYDRODIURIL) 50 MG tablet Take 1 tablet (50 mg total) by mouth daily. (Patient taking differently: Take 50 mg by mouth daily as needed (high blood pressure  over 150).)   ibuprofen (ADVIL) 200 MG tablet Take 800 mg by mouth every 6 (six) hours as needed (inflammation).   ipratropium-albuterol (DUONEB) 0.5-2.5 (3) MG/3ML SOLN Take 3 mLs by nebulization every 6 (six) hours as needed. (Patient taking differently: Take 3 mLs by nebulization every 6 (six) hours as needed (shortness of breath/wheezing).)   metoprolol succinate (TOPROL-XL) 50 MG 24 hr tablet TAKE 1 TABLET(50 MG) BY MOUTH DAILY WITH OR IMMEDIATELY FOLLOWING A MEAL (Patient taking differently: Take 50 mg by mouth daily.)   mirabegron ER (MYRBETRIQ) 50 MG TB24 tablet Take 1 tablet (50 mg total) by mouth daily.   oxyCODONE (ROXICODONE) 15 MG immediate release tablet Take 15 mg by mouth 4 (four) times daily as needed for pain.  OXYGEN Inhale 2 L into the lungs as needed (shortness of breath).   potassium chloride (KLOR-CON) 10 MEQ tablet Take 1 tablet (10 mEq total) by mouth 2 (two) times daily.   PRESCRIPTION MEDICATION Inhale into the lungs at bedtime. cpap   rivaroxaban (XARELTO) 20 MG TABS tablet Take 1 tablet (20 mg total) by mouth every morning.   tiotropium (SPIRIVA HANDIHALER) 18 MCG inhalation capsule PLACE 1 CAPSULE INTO INHALER AND INHALE DAILY (Patient taking differently: Place 18 mcg into inhaler and inhale daily.)   torsemide (DEMADEX) 20 MG tablet TAKE 2 TABLETS BY MOUTH DAILY (Patient taking differently: Take 40 mg by mouth  daily.)     Allergies: Allergies as of 10/07/2020   (No Known Allergies)   Past Medical History:  Diagnosis Date   A-fib ([place])    Anxiety    Arthritis    "qwhre; joints, back" (04/17/2017)   Benign breast cyst in female, left 01/08/2017   Found by Screening mammogram, evaluated by U/S on 01/08/17 and determined to be a benign simple breast cyst.   Cellulitis of left lower leg 05/30/2017   CHF (congestive heart failure) (HCC)    Chronic low back pain 08/21/2016   Chronic lower back pain    Chronic venous insufficiency    /notes 05/30/2017   COPD (chronic obstructive pulmonary disease) (HCC)    Depression    DVT (deep venous thrombosis) ([place]) 11/16/2016   GERD (gastroesophageal reflux disease)    Headache    "weekly for the last 3 months" (04/17/2017)   Hyperlipidemia    Hypertension    Morbid obesity ([place])    PE (pulmonary embolism)    Pulmonary embolism ([place]) 09/21/2014   Sleep apnea     Family History:  Family History  Problem Relation Age of Onset   Breast cancer Mother    Hypertension Mother    Hyperlipidemia Mother    Hypertension Maternal Grandfather    Hyperlipidemia Maternal Grandfather      Social History: Patient lives alone in [place], in a senior living apartment.  She is separated from her husband, but they live in the same apartment building and remain friends. She uses a manual wheelchair in the apartment and has a motorized wheelchair as well.  Uses public transportation and is unable to drive herself.  Her grandson checks on her frequently. Does not use tobacco products.  Enjoys going to church when she feels up to it.  Review of Systems: A complete ROS was negative except as per HPI.   Physical Exam: Blood pressure 137/83, pulse (!) 104, temperature 98.5 F (36.9 C), temperature source Oral, resp. rate (!) 22, height 6\' 2"  (1.88 m), weight (!) 172.4 kg, SpO2 96 %. Gen: Well-developed, obese woman, in no acute distress HENT: NCAT Eyes: Conjunctiva  clear, sclera non-icteric Heart: Irregular rhythm, no murmurs. JVD difficult to assess. Extremities warm.  Lungs: Expiratory wheezes bilaterally, normal pulmonary effort Abd: normal bowel sounds, no tenderness to palpation Extrm: chronic venous stasis ulcer on left lower extremity without surrounding erythema or purulent drainage, 3+ edema bilateral lower extremities with clear drainage present Skin: seborrheic keratosis on face bilaterally. Chronic venous stasis skin changes to the bilateral lower extremities with hyperkeratosis Psych: Oriented x3, normal mood and affect  EKG: Atrial fibrillation with RVR, prolonged Qtc of 540  CXR:  IMPRESSION: Cardiac enlargement with mild interstitial changes in the lungs, likely early edema.  Assessment & Plan by Problem:  Principal Problem:   Chest pain Active Problems:  Morbid obesity ([place])   Benign essential HTN   Atrial fibrillation with controlled ventricular response (HCC)   Chronic venous insufficiency   Chronic heart failure with preserved ejection fraction (HFpEF) ([place])   Ms. Sabala is a 70 year old with past medical history of CHF, A fib on anticoagulation, COPD (on 2L at baseline), HTN, OSA, GERD, and Peripheral vascular disease with Left lower extremity venous stasis ulcer.  Patient admitted due to chest pain, cardiology following.  Atypical Chest pain, Coronary Artery Disease, Hyperlipidemia Characterized by substernal chest pressure after becoming upset in the clinic. Relieved by nitroglycerin. Chest pain resolved by time of admission Troponin wnl (7) on presentation and 7 on repeat. No ischemic changes on EKG. CT Coronary 05/05/2018 showed significant stenosis in the proximal portion of a small non-dominant RCA. No interventions performed at that time. -Cardiology consulted by ED physician. They do not plan to cardiac catheretization at this time. -Cardiology ordered echo, will f/u on that. -tele monitoring -continue aspirin  and statin  Hypokalemia On Klor-Con 10 BID at home. At ED presentation K= 3.1 -will order additional 34mEq potassium -repeat BMP with magnesium in AM  Chronic venous stasis complicated by ulceration of the left lower leg; s/p left common and external iliac stenting  Recently seen in the [facility] on 6/9 for the ulceration. She had declined UNA boot wrapping at that time. Unfortunately, her limited mobility impacts her ability to adequately care for the wound. She presented for follow up for this on 7/8, at which time she was referred to vascular surgery. She was additionally offered a referral to the wound center but declined. On admission, she expressed a lot of frustration surrounding the ulcer. I explained that it is related to her chronic venous stasis and reiterated the role of compression wrapping. Toward the end of the discussion, she was agreeable to the referral to the wound center after discharge.  -recommend referral to wound center for ongoing management after discharge  HTN On Benazepril 40 mg once daily, HCTZ 50 mg prn if BP >150, metoprolol succinate 50 mg, and Torsemide 20 mg daily. -started home medications  HFpEF Difficult to ascertain a dry weight but was noted to be 151kg in May 2021. Body habitus and chronic venous stasis complicate determining her volume status. She was recently admitted for a CHF exacerbation in June, at which time her dry weight was 171. Admission weight today is 172.4kg.  BNP is 75 however may be falsely low in the setting of obesity.  -imdur 30mg  daily started, metoprolol xl increased to 75mg  per cardiology, continue toresemide and benazapril. Would hesitate to add SGLT2 given body habitus and difficulty with hygeine -monitor renal function -Cardiology ordered echo, will f/u on that. -daily weights, strict I/O  Paroxysmal Atrial Fibrillation Diagnosed in 2019, CHAD2VASC score of 5 (age, female, CHF, HTN, Vascular disease hx) on Xarelto 20 mg at  home. -Will continue xarelto -toprolol xl increased to 75mg  daily  COPD, ILD with chronic hypoxic respiratory failure On albuterol inhaler, Advair, Duoneb prn, Spiriva and 2L of oxygen at baseline  She was admitted to [facility] on 6/18 due to a COPD exacerbation and was treated with IV lasix, steroids, and breathing treatments.  She wears CPAP at night. -CPAP at night, continued home medications  OSA. Continue CPAP  GERD On Esomeprazole 40 mg prn at home -started home medication  Urinary incontinence On mirabegron ER 50 mg at home -started home medication  Osteoarthritis of Knees and left hip On Gabapentin  400 mg BID and Roxicodone 15 mg.  Ortho and pain management consulted by [facility] PCP.  Patient stated that she has been evaluated by ortho in the past and they told her with the severity of her peripheral vascular disease, they did not think she was a surgical candidate.  Dispo: Admit patient to Observation with expected length of stay less than 2 midnights.  Signed: Christiana Fuchs, DO 10/08/2020, 12:12 AM  Pager: [phone] After 5pm on weekdays and 1pm on weekends: On Call pager: [phone]

## 2020-10-11 ENCOUNTER — Telehealth: Payer: Self-pay | Admitting: *Deleted

## 2020-10-11 ENCOUNTER — Telehealth: Payer: Medicare Other

## 2020-10-11 NOTE — Telephone Encounter (Signed)
  Care Management   Note  10/11/2020 Name: [name] [name]: [id number] DOB: Mar 30, 1951  Sharon Vasquez is enrolled in a Managed Medicaid plan: No. Outreach attempt today was unsuccessful.   Second unsuccessful attempt to reach patient to arrange home health services for orders written on 7/7 after patient was seen in the clinic. First message was left at 2 pm on 10/08/20.   The care management team will reach out to the patient again over the next 7 days.   Kelli Churn RN, CCM, [facility] RN Care Manager [phone]

## 2020-10-12 ENCOUNTER — Ambulatory Visit: Payer: Medicare Other | Admitting: *Deleted

## 2020-10-12 DIAGNOSIS — J449 Chronic obstructive pulmonary disease, unspecified: Secondary | ICD-10-CM

## 2020-10-12 DIAGNOSIS — L97929 Non-pressure chronic ulcer of unspecified part of left lower leg with unspecified severity: Secondary | ICD-10-CM

## 2020-10-12 DIAGNOSIS — I83029 Varicose veins of left lower extremity with ulcer of unspecified site: Secondary | ICD-10-CM

## 2020-10-12 DIAGNOSIS — I4891 Unspecified atrial fibrillation: Secondary | ICD-10-CM

## 2020-10-12 DIAGNOSIS — J9611 Chronic respiratory failure with hypoxia: Secondary | ICD-10-CM

## 2020-10-12 DIAGNOSIS — I739 Peripheral vascular disease, unspecified: Secondary | ICD-10-CM

## 2020-10-12 DIAGNOSIS — I5032 Chronic diastolic (congestive) heart failure: Secondary | ICD-10-CM

## 2020-10-12 DIAGNOSIS — M170 Bilateral primary osteoarthritis of knee: Secondary | ICD-10-CM

## 2020-10-12 DIAGNOSIS — I10 Essential (primary) hypertension: Secondary | ICD-10-CM

## 2020-10-12 NOTE — Chronic Care Management (AMB) (Signed)
  Care Management   Note  10/12/2020 Name: Sharon Vasquez [name]: [id number] DOB: [birth year]  [name] is enrolled in a Managed Medicaid plan: No. Outreach attempt today was successful.  Reached patient at mobile number in order to discuss arranging home health. Order was written on 10/07/20. Patient states she does not have an agency preference but says she gets all of her DME form [employer] who used to be affiliated with [facility].  With patient's agreement, will send community message to [facility] coordinators advising of referral. Patient is aware she has follow up clinic appointment on 10/14/20.  The care management team will reach out to the patient again over the next 7 days.   Kelli Churn RN, CCM, [facility] RN Care Manager [phone]

## 2020-10-14 ENCOUNTER — Encounter: Payer: Self-pay | Admitting: Internal Medicine

## 2020-10-14 ENCOUNTER — Telehealth: Payer: Self-pay

## 2020-10-14 ENCOUNTER — Other Ambulatory Visit: Payer: Self-pay

## 2020-10-14 ENCOUNTER — Ambulatory Visit (INDEPENDENT_AMBULATORY_CARE_PROVIDER_SITE_OTHER): Payer: Medicare Other | Admitting: Internal Medicine

## 2020-10-14 ENCOUNTER — Ambulatory Visit (HOSPITAL_COMMUNITY)
Admission: RE | Admit: 2020-10-14 | Discharge: 2020-10-14 | Disposition: A | Discharge status: Home / Self Care | Payer: Medicare Other | Source: Ambulatory Visit | Attending: Internal Medicine | Admitting: Internal Medicine

## 2020-10-14 VITALS — BP 133/73 | HR 90 | Temp 98.5°F | Ht 74.0 in

## 2020-10-14 DIAGNOSIS — I83029 Varicose veins of left lower extremity with ulcer of unspecified site: Secondary | ICD-10-CM | POA: Diagnosis not present

## 2020-10-14 DIAGNOSIS — M1612 Unilateral primary osteoarthritis, left hip: Secondary | ICD-10-CM | POA: Insufficient documentation

## 2020-10-14 DIAGNOSIS — I10 Essential (primary) hypertension: Secondary | ICD-10-CM

## 2020-10-14 DIAGNOSIS — L97929 Non-pressure chronic ulcer of unspecified part of left lower leg with unspecified severity: Secondary | ICD-10-CM

## 2020-10-14 DIAGNOSIS — E782 Mixed hyperlipidemia: Secondary | ICD-10-CM | POA: Diagnosis not present

## 2020-10-14 DIAGNOSIS — M25552 Pain in left hip: Secondary | ICD-10-CM | POA: Diagnosis not present

## 2020-10-14 NOTE — Patient Instructions (Signed)
Thank you, Sharon Vasquez for allowing [place] to provide your care today. Today we discussed your wound on your left leg and your recent hospitalization.  Left leg wound: Please call vascular surgery at [phone] to make an appointment as soon as possible. We are re-wrapping your wound until you can get in to see them.  High blood pressure: In the hospital they increased your metoprolol dose to 75 mg daily,decreased your benazepril to 20 mg daily, and started you on isosorbide mononitrate 30 mg. Please make sure you are taking these correctly. Follow up with your cardiologist on 7/22. Left hip pain: Please call your orthopedic doctor to schedule an appt with them. We are also ordering an Xray of your left hip today.   I have ordered the following labs for you:  Lab Orders  No laboratory test(s) ordered today     Tests ordered today:  Left hip xray  Referrals ordered today:   Referral Orders  No referral(s) requested today     I have ordered the following medication/changed the following medications:   Stop the following medications: There are no discontinued medications.   Start the following medications: No orders of the defined types were placed in this encounter.    Follow up:  1 month     Remember: To see the vascular surgeon about your wound!   Should you have any questions or concerns please call the internal medicine clinic at [phone].     Buddy Duty, D.O. [place]

## 2020-10-14 NOTE — Assessment & Plan Note (Signed)
Atorvastatin dose increased to 80mg  daily upon recent discharge.   Plan: - Continue atorvastatin 80mg 

## 2020-10-14 NOTE — Progress Notes (Signed)
CC: follow up venous stasis ulcer  HPI:  Sharon Vasquez is a 70 y.o. with a PMHx of COPD, CHF, hypertension, hyperlipidemia, venous insufficiency, venous stasis ulcer of the left leg.  She is presenting to the [facility] for 1 week follow-up regarding her left leg ulcer and a recent hospitalization.  Patient had her leg wrapped with a silicone foam bandage last week at the [facility] however this fell off by the time she got home due to persistent drainage from the wound.  She was seen in the hospital later that night for chest pain and was admitted and seen by cardiology.  On discharge her metoprolol dose was increased to 75 mg daily her benazepril was decreased to 20 mg daily and she was started on isosorbide mononitrate 30 mg.  She is to follow-up with cardiology and is seeing them on July 22.  As for her wound, the ulcer appears stable. There is still drainage however there is no surrounding erythema and no signs of systemic infection.  Patient is also complaining of left hip pain still. Patient states that she difficulties moving her leg due to the pain in her left hip.  She denies any recent injuries or falls. She has previously followed with orthopedics and states that she is going to give them a call so she can follow-up with them, despite not being a surgical candidate.   Past Medical History:  Diagnosis Date   A-fib [facility])    Anxiety    Arthritis    "qwhre; joints, back" (04/17/2017)   Benign breast cyst in female, left 01/08/2017   Found by Screening mammogram, evaluated by U/S on 01/08/17 and determined to be a benign simple breast cyst.   Cellulitis of left lower leg 05/30/2017   CHF (congestive heart failure) ([place])    Chronic low back pain 08/21/2016   Chronic lower back pain    Chronic venous insufficiency    /notes 05/30/2017   COPD (chronic obstructive pulmonary disease) (HCC)    Depression    DVT (deep venous thrombosis) ([place]) 11/16/2016   GERD (gastroesophageal reflux disease)     Headache    "weekly for the last 3 months" (04/17/2017)   Hyperlipidemia    Hypertension    Morbid obesity ([place])    PE (pulmonary embolism)    Pulmonary embolism ([place]) 09/21/2014   Sleep apnea    Review of Systems:  Review of Systems  Constitutional:  Negative for chills and fever.  HENT:  Negative for hearing loss and tinnitus.   Eyes:  Negative for blurred vision and double vision.  Respiratory:  Positive for shortness of breath and wheezing.   Cardiovascular:  Positive for leg swelling. Negative for chest pain and palpitations.  Gastrointestinal:  Negative for abdominal pain, diarrhea, nausea and vomiting.  Genitourinary:  Negative for dysuria and urgency.  Musculoskeletal:  Positive for joint pain.  Neurological:  Negative for dizziness and headaches.  Psychiatric/Behavioral:  The patient is not nervous/anxious.     Physical Exam:  Vitals:   10/14/20 0844  BP: 133/73  Pulse: 90  Temp: 98.5 F (36.9 C)  TempSrc: Oral  SpO2: 93%  Height: 6\' 2"  (1.88 m)   Physical Exam Constitutional:      Appearance: She is obese. She is ill-appearing.  HENT:     Head: Normocephalic and atraumatic.  Eyes:     Pupils: Pupils are equal, round, and reactive to light.  Cardiovascular:     Rate and Rhythm: Normal rate and  regular rhythm.  Pulmonary:     Effort: Pulmonary effort is normal. No respiratory distress.     Breath sounds: Wheezing present.  Abdominal:     General: Bowel sounds are normal.     Palpations: Abdomen is soft.     Tenderness: There is no abdominal tenderness.  Musculoskeletal:     Right lower leg: No edema.     Left lower leg: No edema.     Comments: 3+ bilateral lower extremity edema. Chronic venous stasis dermatitis and ulcer on left anterolateral aspect of leg, appears stable compared to last visit   Skin:    General: Skin is warm and dry.  Neurological:     General: No focal deficit present.     Mental Status: She is alert and oriented to person, place,  and time.  Psychiatric:        Mood and Affect: Mood normal.        Behavior: Behavior normal.     Assessment & Plan:   See Encounters Tab for problem based charting.  Patient seen with Dr. Daryll Drown

## 2020-10-14 NOTE — Telephone Encounter (Signed)
A rep from [facility] is returning a call she stated that [facility] left a message stating  she could ask for lauren as well .. her name is Sharon Vasquez  --[phone].. she stated that they are unable to staff PT

## 2020-10-14 NOTE — Assessment & Plan Note (Addendum)
Patient recently admitted for chest pain. Discharged on metoprolol 75mg  daily, benazepril 20mg  daily, and imdur 30mg  daily. BP stable today. Has appt to see cardio on 7/22.  Patient advised to get repeat BMP in office today however patient refusing at this time as she does not want any blood work done.  Plan: - Continue current regimen - Follow up cardio recs  - Repeat BMP at next visit  - Follow up 1 month

## 2020-10-14 NOTE — Progress Notes (Signed)
Internal Medicine Clinic Attending ° °I saw and evaluated the patient.  I personally confirmed the key portions of the history and exam documented by Dr. Atway and I reviewed pertinent patient test results.  The assessment, diagnosis, and plan were formulated together and I agree with the documentation in the resident’s note.  °

## 2020-10-14 NOTE — Assessment & Plan Note (Signed)
Patient continues to complain of left hip pain.  She has a known history of left hip osteoarthritis and denies any recent injuries or falls.  She states that she has difficulty moving her left leg due to the pain.  She previously was following orthopedics and states that she is going to call them to make an appointment sometime soon.  Patient is not a surgical candidate however would still benefit from follow-up with orthopedics.  Plan: - Left hip xray today to evaluate osteoarthritis progression  - Patient to call ortho for follow up

## 2020-10-14 NOTE — Assessment & Plan Note (Signed)
Patient presenting for 1 week follow-up of left lower extremity venous stasis ulcer.  Wound appears to be stable as compared to last visit.  There is still clear drainage coming from the area but there is no surrounding erythema or purulent drainage.  She denies any fevers or chills, thus systemic infection does not seem likely still.  Silicone foam bandage and dressing were applied to the wound last week however the dressing fell off by the time the patient got home.  Patient had her wound redressed in the hospital and wrapped with Ace bandage. See pictures from last visit as wound remains stable.  Patient was also referred to vascular surgery at the last visit however she has not made an appointment with them yet.  Patient called vascular while at the [facility] and schedule an appointment for August 4 with vascular.  Plan: - Re-wrapped wound today  - Follow up vascular surgery

## 2020-10-15 NOTE — Telephone Encounter (Signed)
I called [facility] to see if they would be able to take this patient.I have fax information to [phone] and [phone].The phone number is [phone].I will wait to hear back from them [place], [place] C7/15/202211:54 AM

## 2020-10-16 DIAGNOSIS — I739 Peripheral vascular disease, unspecified: Secondary | ICD-10-CM | POA: Diagnosis not present

## 2020-10-16 DIAGNOSIS — G4730 Sleep apnea, unspecified: Secondary | ICD-10-CM | POA: Diagnosis not present

## 2020-10-16 DIAGNOSIS — I4891 Unspecified atrial fibrillation: Secondary | ICD-10-CM | POA: Diagnosis not present

## 2020-10-16 DIAGNOSIS — L97822 Non-pressure chronic ulcer of other part of left lower leg with fat layer exposed: Secondary | ICD-10-CM | POA: Diagnosis not present

## 2020-10-16 DIAGNOSIS — I872 Venous insufficiency (chronic) (peripheral): Secondary | ICD-10-CM | POA: Diagnosis not present

## 2020-10-16 DIAGNOSIS — Z9981 Dependence on supplemental oxygen: Secondary | ICD-10-CM | POA: Diagnosis not present

## 2020-10-16 DIAGNOSIS — Z87891 Personal history of nicotine dependence: Secondary | ICD-10-CM | POA: Diagnosis not present

## 2020-10-16 DIAGNOSIS — E785 Hyperlipidemia, unspecified: Secondary | ICD-10-CM | POA: Diagnosis not present

## 2020-10-16 DIAGNOSIS — K219 Gastro-esophageal reflux disease without esophagitis: Secondary | ICD-10-CM | POA: Diagnosis not present

## 2020-10-16 DIAGNOSIS — Z86718 Personal history of other venous thrombosis and embolism: Secondary | ICD-10-CM | POA: Diagnosis not present

## 2020-10-16 DIAGNOSIS — I110 Hypertensive heart disease with heart failure: Secondary | ICD-10-CM | POA: Diagnosis not present

## 2020-10-16 DIAGNOSIS — J9611 Chronic respiratory failure with hypoxia: Secondary | ICD-10-CM | POA: Diagnosis not present

## 2020-10-16 DIAGNOSIS — I5032 Chronic diastolic (congestive) heart failure: Secondary | ICD-10-CM | POA: Diagnosis not present

## 2020-10-16 DIAGNOSIS — J449 Chronic obstructive pulmonary disease, unspecified: Secondary | ICD-10-CM | POA: Diagnosis not present

## 2020-10-16 DIAGNOSIS — G4733 Obstructive sleep apnea (adult) (pediatric): Secondary | ICD-10-CM | POA: Diagnosis not present

## 2020-10-16 DIAGNOSIS — G8929 Other chronic pain: Secondary | ICD-10-CM | POA: Diagnosis not present

## 2020-10-16 DIAGNOSIS — Z86711 Personal history of pulmonary embolism: Secondary | ICD-10-CM | POA: Diagnosis not present

## 2020-10-16 DIAGNOSIS — M810 Age-related osteoporosis without current pathological fracture: Secondary | ICD-10-CM | POA: Diagnosis not present

## 2020-10-16 DIAGNOSIS — M545 Low back pain, unspecified: Secondary | ICD-10-CM | POA: Diagnosis not present

## 2020-10-16 DIAGNOSIS — M170 Bilateral primary osteoarthritis of knee: Secondary | ICD-10-CM | POA: Diagnosis not present

## 2020-10-17 ENCOUNTER — Other Ambulatory Visit: Payer: Self-pay | Admitting: Internal Medicine

## 2020-10-17 DIAGNOSIS — I10 Essential (primary) hypertension: Secondary | ICD-10-CM

## 2020-10-17 DIAGNOSIS — J449 Chronic obstructive pulmonary disease, unspecified: Secondary | ICD-10-CM | POA: Diagnosis not present

## 2020-10-17 DIAGNOSIS — I739 Peripheral vascular disease, unspecified: Secondary | ICD-10-CM

## 2020-10-19 DIAGNOSIS — I5032 Chronic diastolic (congestive) heart failure: Secondary | ICD-10-CM | POA: Diagnosis not present

## 2020-10-19 DIAGNOSIS — I872 Venous insufficiency (chronic) (peripheral): Secondary | ICD-10-CM | POA: Diagnosis not present

## 2020-10-19 DIAGNOSIS — I739 Peripheral vascular disease, unspecified: Secondary | ICD-10-CM | POA: Diagnosis not present

## 2020-10-19 DIAGNOSIS — E785 Hyperlipidemia, unspecified: Secondary | ICD-10-CM | POA: Diagnosis not present

## 2020-10-19 DIAGNOSIS — I110 Hypertensive heart disease with heart failure: Secondary | ICD-10-CM | POA: Diagnosis not present

## 2020-10-19 DIAGNOSIS — I4891 Unspecified atrial fibrillation: Secondary | ICD-10-CM | POA: Diagnosis not present

## 2020-10-19 DIAGNOSIS — K219 Gastro-esophageal reflux disease without esophagitis: Secondary | ICD-10-CM | POA: Diagnosis not present

## 2020-10-19 DIAGNOSIS — Z9981 Dependence on supplemental oxygen: Secondary | ICD-10-CM | POA: Diagnosis not present

## 2020-10-19 DIAGNOSIS — M545 Low back pain, unspecified: Secondary | ICD-10-CM | POA: Diagnosis not present

## 2020-10-19 DIAGNOSIS — G4730 Sleep apnea, unspecified: Secondary | ICD-10-CM | POA: Diagnosis not present

## 2020-10-19 DIAGNOSIS — G8929 Other chronic pain: Secondary | ICD-10-CM | POA: Diagnosis not present

## 2020-10-19 DIAGNOSIS — J9611 Chronic respiratory failure with hypoxia: Secondary | ICD-10-CM | POA: Diagnosis not present

## 2020-10-19 DIAGNOSIS — Z87891 Personal history of nicotine dependence: Secondary | ICD-10-CM | POA: Diagnosis not present

## 2020-10-19 DIAGNOSIS — L97822 Non-pressure chronic ulcer of other part of left lower leg with fat layer exposed: Secondary | ICD-10-CM | POA: Diagnosis not present

## 2020-10-19 DIAGNOSIS — M810 Age-related osteoporosis without current pathological fracture: Secondary | ICD-10-CM | POA: Diagnosis not present

## 2020-10-19 DIAGNOSIS — J449 Chronic obstructive pulmonary disease, unspecified: Secondary | ICD-10-CM | POA: Diagnosis not present

## 2020-10-19 DIAGNOSIS — Z86711 Personal history of pulmonary embolism: Secondary | ICD-10-CM | POA: Diagnosis not present

## 2020-10-19 DIAGNOSIS — Z86718 Personal history of other venous thrombosis and embolism: Secondary | ICD-10-CM | POA: Diagnosis not present

## 2020-10-19 DIAGNOSIS — G4733 Obstructive sleep apnea (adult) (pediatric): Secondary | ICD-10-CM | POA: Diagnosis not present

## 2020-10-19 DIAGNOSIS — M170 Bilateral primary osteoarthritis of knee: Secondary | ICD-10-CM | POA: Diagnosis not present

## 2020-10-20 NOTE — Telephone Encounter (Signed)
I spoke with [facility] to see if they were able to take this patient for wound care. They were able to take patient and the start of care date was 10-16-2020 [place], [place] C7/20/20221:53 PM

## 2020-10-21 DIAGNOSIS — M170 Bilateral primary osteoarthritis of knee: Secondary | ICD-10-CM | POA: Diagnosis not present

## 2020-10-21 DIAGNOSIS — I5032 Chronic diastolic (congestive) heart failure: Secondary | ICD-10-CM | POA: Diagnosis not present

## 2020-10-21 DIAGNOSIS — I872 Venous insufficiency (chronic) (peripheral): Secondary | ICD-10-CM | POA: Diagnosis not present

## 2020-10-21 DIAGNOSIS — Z86711 Personal history of pulmonary embolism: Secondary | ICD-10-CM | POA: Diagnosis not present

## 2020-10-21 DIAGNOSIS — I739 Peripheral vascular disease, unspecified: Secondary | ICD-10-CM | POA: Diagnosis not present

## 2020-10-21 DIAGNOSIS — E785 Hyperlipidemia, unspecified: Secondary | ICD-10-CM | POA: Diagnosis not present

## 2020-10-21 DIAGNOSIS — M810 Age-related osteoporosis without current pathological fracture: Secondary | ICD-10-CM | POA: Diagnosis not present

## 2020-10-21 DIAGNOSIS — M545 Low back pain, unspecified: Secondary | ICD-10-CM | POA: Diagnosis not present

## 2020-10-21 DIAGNOSIS — J9611 Chronic respiratory failure with hypoxia: Secondary | ICD-10-CM | POA: Diagnosis not present

## 2020-10-21 DIAGNOSIS — G4733 Obstructive sleep apnea (adult) (pediatric): Secondary | ICD-10-CM | POA: Diagnosis not present

## 2020-10-21 DIAGNOSIS — I4891 Unspecified atrial fibrillation: Secondary | ICD-10-CM | POA: Diagnosis not present

## 2020-10-21 DIAGNOSIS — Z9981 Dependence on supplemental oxygen: Secondary | ICD-10-CM | POA: Diagnosis not present

## 2020-10-21 DIAGNOSIS — L97822 Non-pressure chronic ulcer of other part of left lower leg with fat layer exposed: Secondary | ICD-10-CM | POA: Diagnosis not present

## 2020-10-21 DIAGNOSIS — G8929 Other chronic pain: Secondary | ICD-10-CM | POA: Diagnosis not present

## 2020-10-21 DIAGNOSIS — K219 Gastro-esophageal reflux disease without esophagitis: Secondary | ICD-10-CM | POA: Diagnosis not present

## 2020-10-21 DIAGNOSIS — Z87891 Personal history of nicotine dependence: Secondary | ICD-10-CM | POA: Diagnosis not present

## 2020-10-21 DIAGNOSIS — Z86718 Personal history of other venous thrombosis and embolism: Secondary | ICD-10-CM | POA: Diagnosis not present

## 2020-10-21 DIAGNOSIS — J449 Chronic obstructive pulmonary disease, unspecified: Secondary | ICD-10-CM | POA: Diagnosis not present

## 2020-10-21 DIAGNOSIS — I110 Hypertensive heart disease with heart failure: Secondary | ICD-10-CM | POA: Diagnosis not present

## 2020-10-21 DIAGNOSIS — G4730 Sleep apnea, unspecified: Secondary | ICD-10-CM | POA: Diagnosis not present

## 2020-10-22 ENCOUNTER — Encounter (HOSPITAL_BASED_OUTPATIENT_CLINIC_OR_DEPARTMENT_OTHER): Payer: Self-pay | Admitting: Family

## 2020-10-22 ENCOUNTER — Other Ambulatory Visit: Payer: Self-pay

## 2020-10-22 ENCOUNTER — Ambulatory Visit (INDEPENDENT_AMBULATORY_CARE_PROVIDER_SITE_OTHER): Payer: Medicare Other | Admitting: Family

## 2020-10-22 VITALS — BP 96/54 | HR 86 | Ht 74.0 in

## 2020-10-22 DIAGNOSIS — Z7901 Long term (current) use of anticoagulants: Secondary | ICD-10-CM

## 2020-10-22 DIAGNOSIS — I10 Essential (primary) hypertension: Secondary | ICD-10-CM

## 2020-10-22 DIAGNOSIS — Z86711 Personal history of pulmonary embolism: Secondary | ICD-10-CM | POA: Diagnosis not present

## 2020-10-22 DIAGNOSIS — I4821 Permanent atrial fibrillation: Secondary | ICD-10-CM | POA: Diagnosis not present

## 2020-10-22 DIAGNOSIS — Z86718 Personal history of other venous thrombosis and embolism: Secondary | ICD-10-CM | POA: Diagnosis not present

## 2020-10-22 DIAGNOSIS — I739 Peripheral vascular disease, unspecified: Secondary | ICD-10-CM | POA: Diagnosis not present

## 2020-10-22 DIAGNOSIS — M810 Age-related osteoporosis without current pathological fracture: Secondary | ICD-10-CM | POA: Diagnosis not present

## 2020-10-22 DIAGNOSIS — I5032 Chronic diastolic (congestive) heart failure: Secondary | ICD-10-CM

## 2020-10-22 DIAGNOSIS — G4733 Obstructive sleep apnea (adult) (pediatric): Secondary | ICD-10-CM | POA: Diagnosis not present

## 2020-10-22 DIAGNOSIS — Z9981 Dependence on supplemental oxygen: Secondary | ICD-10-CM | POA: Diagnosis not present

## 2020-10-22 DIAGNOSIS — G4730 Sleep apnea, unspecified: Secondary | ICD-10-CM | POA: Diagnosis not present

## 2020-10-22 DIAGNOSIS — Z720 Tobacco use: Secondary | ICD-10-CM | POA: Diagnosis not present

## 2020-10-22 DIAGNOSIS — J439 Emphysema, unspecified: Secondary | ICD-10-CM | POA: Diagnosis not present

## 2020-10-22 DIAGNOSIS — K219 Gastro-esophageal reflux disease without esophagitis: Secondary | ICD-10-CM | POA: Diagnosis not present

## 2020-10-22 DIAGNOSIS — I872 Venous insufficiency (chronic) (peripheral): Secondary | ICD-10-CM | POA: Diagnosis not present

## 2020-10-22 DIAGNOSIS — I4891 Unspecified atrial fibrillation: Secondary | ICD-10-CM | POA: Diagnosis not present

## 2020-10-22 DIAGNOSIS — M170 Bilateral primary osteoarthritis of knee: Secondary | ICD-10-CM | POA: Diagnosis not present

## 2020-10-22 DIAGNOSIS — J449 Chronic obstructive pulmonary disease, unspecified: Secondary | ICD-10-CM | POA: Diagnosis not present

## 2020-10-22 DIAGNOSIS — G8929 Other chronic pain: Secondary | ICD-10-CM | POA: Diagnosis not present

## 2020-10-22 DIAGNOSIS — I110 Hypertensive heart disease with heart failure: Secondary | ICD-10-CM | POA: Diagnosis not present

## 2020-10-22 DIAGNOSIS — L97822 Non-pressure chronic ulcer of other part of left lower leg with fat layer exposed: Secondary | ICD-10-CM | POA: Diagnosis not present

## 2020-10-22 DIAGNOSIS — E785 Hyperlipidemia, unspecified: Secondary | ICD-10-CM | POA: Diagnosis not present

## 2020-10-22 DIAGNOSIS — J9611 Chronic respiratory failure with hypoxia: Secondary | ICD-10-CM | POA: Diagnosis not present

## 2020-10-22 DIAGNOSIS — M545 Low back pain, unspecified: Secondary | ICD-10-CM | POA: Diagnosis not present

## 2020-10-22 DIAGNOSIS — Z87891 Personal history of nicotine dependence: Secondary | ICD-10-CM | POA: Diagnosis not present

## 2020-10-22 MED ORDER — METOPROLOL SUCCINATE ER 25 MG PO TB24: 25.0000 mg | ORAL_TABLET | Freq: Every day | ORAL | 3 refills | Status: DC

## 2020-10-22 MED ORDER — TORSEMIDE 20 MG PO TABS: ORAL_TABLET | ORAL | 1 refills | Status: DC

## 2020-10-22 NOTE — Patient Instructions (Addendum)
Medication Instructions:   Your physician has recommended you make the following change in your medication:   STOP Hydrochlorothiazide  STOP Benazepril  CHANGE Metoprolol to one '25mg'$  tablet daily  CONTINUE Isosorbide Mononitrate (Imdur) one '30mg'$  tablet daily  TAKE Torsemide '40mg'$  daily - you may take an additional '20mg'$  tablet as needed for weight gain of 2 pounds overnight or 5 pounds in one week  *If you need a refill on your cardiac medications before your next appointment, please call your pharmacy*   Lab Work: None ordered today   Testing/Procedures: Your EKG today showed rate controlled atrial fibrillation   Follow-Up: At [facility], you and your health needs are our priority.  As part of our continuing mission to provide you with exceptional heart care, we have created designated Provider Care Teams.  These Care Teams include your primary Cardiologist (physician) and Advanced Practice Providers (APPs -  Physician Assistants and Nurse Practitioners) who all work together to provide you with the care you need, when you need it.  We recommend signing up for the patient portal called "MyChart".  Sign up information is provided on this After Visit Summary.  MyChart is used to connect with patients for Virtual Visits (Telemedicine).  Patients are able to view lab/test results, encounter notes, upcoming appointments, etc.  Non-urgent messages can be sent to your provider as well.   To learn more about what you can do with MyChart, go to [email or web address].    Your next appointment:   2 month(s)  The format for your next appointment:   In Person  Provider:   You may see Quay Burow, MD or one of the following Advanced Practice Providers on your designated Care Team:   [place], PA-C Coletta Memos, FNP   Other Instructions  Loel Dubonnet, NP will send a message to your home health nurse that an Rolena Infante would be appropriate.   Check your blood  pressure daily and keep a log. If your blood pressure is consistently more than 130/80, please contact our office.   Recommend weighing daily and keeping a log. Please call our office if you have weight gain of 2 pounds overnight or 5 pounds in 1 week.   Heart Healthy Diet Recommendations: A low-salt diet is recommended. Meats should be grilled, baked, or boiled. Avoid fried foods. Focus on lean protein sources like fish or chicken with vegetables and fruits. The American Heart Association is a Microbiologist!  American Heart Association Diet and Lifeystyle Recommendations     Exercise recommendations: The American Heart Association recommends 150 minutes of moderate intensity exercise weekly. Try 30 minutes of moderate intensity exercise 4-5 times per week. This could include walking, jogging, or swimming.

## 2020-10-22 NOTE — Progress Notes (Signed)
Office Visit    Patient Name: Sharon Vasquez Date of Encounter: 10/22/2020  PCP:  [name], DO   [facility]  Cardiologist:  Quay Burow, MD  Advanced Practice Provider:  No care team member to display Electrophysiologist:  None    Chief Complaint    Sharon Vasquez is a 70 y.o. female with a hx of PVD, essential hypertension, chronic venous insufficiency, aortic valve sclerosis, chronic diastolic heart failure, atrial fibrillation, obesity presents today for hospital follow-up  Past Medical History    Past Medical History:  Diagnosis Date   A-fib [facility])    Anxiety    Arthritis    "qwhre; joints, back" (04/17/2017)   Benign breast cyst in female, left 01/08/2017   Found by Screening mammogram, evaluated by U/S on 01/08/17 and determined to be a benign simple breast cyst.   Cellulitis of left lower leg 05/30/2017   CHF (congestive heart failure) ([place])    Chronic low back pain 08/21/2016   Chronic lower back pain    Chronic venous insufficiency    /notes 05/30/2017   COPD (chronic obstructive pulmonary disease) ([place])    Depression    DVT (deep venous thrombosis) ([place]) 11/16/2016   GERD (gastroesophageal reflux disease)    Headache    "weekly for the last 3 months" (04/17/2017)   Hyperlipidemia    Hypertension    Morbid obesity ([place])    PE (pulmonary embolism)    Pulmonary embolism ([place]) 09/21/2014   Sleep apnea    Past Surgical History:  Procedure Laterality Date   ABDOMINAL HYSTERECTOMY     APPENDECTOMY     BALLOON DILATION N/A 03/09/2020   Procedure: BALLOON DILATION;  Surgeon: Otis Brace, MD;  Location: [facility];  Service: Gastroenterology;  Laterality: N/A;   BIOPSY  03/09/2020   Procedure: BIOPSY;  Surgeon: Otis Brace, MD;  Location: [facility];  Service: Gastroenterology;;   BREAST CYST EXCISION Left    "six o'clock"   BREAST LUMPECTOMY Left    CHOLECYSTECTOMY     DILATION AND CURETTAGE OF UTERUS      ESOPHAGOGASTRODUODENOSCOPY (EGD) WITH PROPOFOL N/A 03/09/2020   Procedure: ESOPHAGOGASTRODUODENOSCOPY (EGD) WITH PROPOFOL;  Surgeon: Otis Brace, MD;  Location: [facility];  Service: Gastroenterology;  Laterality: N/A;   IR ABLATE LIVER CRYOABLATION  07/23/2019   IR RADIOLOGIST EVAL & MGMT  07/18/2019   TONSILLECTOMY AND ADENOIDECTOMY     TUBAL LIGATION      Allergies  No Known Allergies  History of Present Illness    Sharon Vasquez is a 70 y.o. female with a hx of PVD, essential hypertension, chronic venous insufficiency, aortic valve sclerosis, nonobstructive CAD, OSA, hyperlipidemia chronic diastolic heart failure, atrial fibrillation, obesity last seen 08/11/2020 by Coletta Memos, NP.  Initially evaluated today in 2018 after admission diastolic heart failure and atrial fibrillation.  She had PE and was placed on Xarelto.  CT chest September 2019 showed left main/three-vessel coronary disease.  She did complain of atypical chest pain.  She underwent sleep study and was placed on CPAP.  She had coronary CTA with physiologically significant lesion and small nondominant RCA.  Echo April 2018 with normal LVEF.  She was hospitalized January 2022 with COPD exacerbation.  Treated with antibiotics and steroid taper.  Last seen in clinic 08/11/2020 doing overall well.  Weight has started to go down since stopping prednisone.  No changes were made.  She was admitted 09/2020 at [facility] for acute pulmonary  edema.  [place] with mild cardiomegaly and mild interstitial pulmonary edema.  She was treated with Lasix, Lotensin, aspirin.  She was also treated for COPD with prednisone and albuterol.    She was seen in the hospital 10/08/2020 with chest pain that was overall atypical.  Her lisinopril was stopped and started on Imdur.  Stress testing was offered but she preferred medical therapy due to anxiety.  She presents today for follow-up with her sister.  She is in motorized wheelchair on exam.   Her chief complaint today is a nonhealing venous stasis ulcer on her left lower extremity.  It is presently wrapped by her home health nurse.  She is interested in [employer] as she had good results with these previously but home health requires in order to do so.  She has upcoming Appointment to establish vascular surgery in 2 weeks. Reports no shortness of breath at rest and dyspnea on exertion is stable at her baseline. Reports no chest pain, pressure, or tightness. No  orthopnea, PND.  Tells me her lower extremity edema is actually overall improving.  Reports no palpitations.  She does note getting low blood pressure readings at home and has self decreased her metoprolol to 25 mg daily.  She has not been taking her hydrochlorothiazide.  EKGs/Labs/Other Studies Reviewed:   The following studies were reviewed today:  Echo 10/08/20 1. Left ventricular ejection fraction, by estimation, is 60 to 65%. The  left ventricle has normal function. The left ventricle has no regional  wall motion abnormalities. The left ventricular internal cavity size was  mildly dilated. There is moderate  left ventricular hypertrophy. Left ventricular diastolic parameters are  indeterminate.   2. Right ventricular systolic function is normal. The right ventricular  size is mildly enlarged. There is mildly elevated pulmonary artery  systolic pressure. The estimated right ventricular systolic pressure is  123456 mmHg.   3. Left atrial size was mildly dilated.   4. Right atrial size was mildly dilated.   5. The mitral valve is normal in structure. No evidence of mitral valve  regurgitation.   6. Tricuspid valve regurgitation is mild to moderate.   7. The aortic valve is tricuspid. Aortic valve regurgitation is not  visualized. Mild to moderate aortic valve sclerosis/calcification is  present, without any evidence of aortic stenosis.   8. The inferior vena cava is dilated in size with >50% respiratory  variability,  suggesting right atrial pressure of 8 mmHg.   Echocardiogram 09/30/2019   IMPRESSIONS     1. Left ventricular ejection fraction, by estimation, is 65 to 70%. The  left ventricle has hyperdynamic function. The left ventricle has no  regional wall motion abnormalities. The left ventricular internal cavity  size was mildly dilated. There is mild  concentric left ventricular hypertrophy. Left ventricular diastolic  parameters are consistent with Grade II diastolic dysfunction  (pseudonormalization). Elevated left atrial pressure.   2. Right ventricular systolic function is hyperdynamic. The right  ventricular size is normal.   3. Left atrial size was moderately dilated.   4. The mitral valve is normal in structure. Trivial mitral valve  regurgitation.   5. The aortic valve is normal in structure. Aortic valve regurgitation is  not visualized. Mild aortic valve sclerosis is present, with no evidence  of aortic valve stenosis.   6. The inferior vena cava is dilated in size with >50% respiratory  variability, suggesting right atrial pressure of 8 mmHg.   Comparison(s): Prior images reviewed side by side.  The left ventricular  function is unchanged. The left ventricular diastolic function is  significantly worse. Findings are consistent ith high output diastolic  heart failure, such as obesity cardiomyopathy   (should exclude anemia, thyrotoxicosis, AV fistula, etc.).  Coronary CTA 05/07/2018: Impressions: 1. Coronary calcium score of 2110. This was 61 percentile for age and sex matched control. 2. Normal coronary origin with left dominance. 3. Moderate to severe diffuse CAD with stenosis 50-69% in the proximal LAD, distal LCX arteries and suspicion for a severe stenosis in the mid portion of the 1. diagonal artery. Additional analysis with CT FFR will be submitted. 4. Moderately dilated pulmonary artery measuring 37 mm suspicious of pulmonary hypertension.   FFRct analysis was  performed on the original cardiac CT angiogram dataset. Diagrammatic representation of the FFRct analysis is provided in a separate PDF document in PACS. This dictation was created using the PDF document and an interactive 3D model of the results. 3D model is not available in the EMR/PACS. Normal FFR range is >0.80.   1. Left Main:  No significant stenosis. 2. LAD: No significant stenosis. 3. D1: No significant stenosis. 4. Ramus intermedius: No significant stenosis. 5. LCX: No significant stenosis. 6. RCA: Proximal: 1.0, mid 0.61.   Impressions: 1. CT FFR analysis showed significant stenosis in the proximal portion of a small non-dominant RCA. An aggressive medical management is recommended.   EKG:  EKG is  ordered today.  The ekg ordered today demonstrates rate controlled atrial fibrillation 86 bpm with minimal voltage criteria for LVH. Noted nonspecific T wave changes.   Recent Labs: 10/07/2020: ALT 17; B Natriuretic Peptide 75.0; Hemoglobin 13.0; Platelets 364 10/08/2020: BUN 12; Creatinine, Ser 0.98; Magnesium 1.6; Potassium 3.3; Sodium 140  Recent Lipid Panel    Component Value Date/Time   CHOL 142 05/14/2020 1033   TRIG 214 (H) 05/14/2020 1033   HDL 25 (L) 05/14/2020 1033   CHOLHDL 5.7 (H) 05/14/2020 1033   LDLCALC 81 05/14/2020 1033    Risk Assessment/Calculations:   CHA2DS2-VASc Score = 5  This indicates a 7.2% annual risk of stroke. The patient's score is based upon: CHF History: Yes HTN History: Yes Diabetes History: No Stroke History: No Vascular Disease History: Yes Age Score: 1 Gender Score: 1    Home Medications   Current Meds  Medication Sig   albuterol (VENTOLIN HFA) 108 (90 Base) MCG/ACT inhaler Inhale into the lungs.   aspirin EC 81 MG tablet Take 81 mg by mouth daily.   atorvastatin (LIPITOR) 80 MG tablet TAKE 1 TABLET(80 MG) BY MOUTH DAILY   benazepril (LOTENSIN) 20 MG tablet TAKE 1 TABLET(20 MG) BY MOUTH DAILY   esomeprazole (NEXIUM) 40 MG  capsule Take 1 capsule (40 mg total) by mouth daily as needed (for heartburn or indigestion). (Patient taking differently: Take 40 mg by mouth daily.)   Fluticasone-Salmeterol (ADVAIR) 250-50 MCG/DOSE AEPB Inhale 1 puff into the lungs 2 (two) times daily.   gabapentin (NEURONTIN) 400 MG capsule TAKE 1 CAPSULE BY MOUTH  TWICE DAILY (Patient taking differently: Take 400 mg by mouth 2 (two) times daily.)   hydrochlorothiazide (HYDRODIURIL) 50 MG tablet Take 1 tablet (50 mg total) by mouth daily. (Patient taking differently: Take 50 mg by mouth daily as needed (high blood pressure  over 150).)   ipratropium-albuterol (DUONEB) 0.5-2.5 (3) MG/3ML SOLN Take 3 mLs by nebulization every 6 (six) hours as needed. (Patient taking differently: Take 3 mLs by nebulization every 6 (six) hours as needed (shortness of breath/wheezing).)  isosorbide mononitrate (IMDUR) 30 MG 24 hr tablet TAKE 1 TABLET(30 MG) BY MOUTH DAILY   metoprolol succinate (TOPROL-XL) 25 MG 24 hr tablet TAKE 3 TABLETS(75 MG) BY MOUTH DAILY (Patient taking differently: Take 25 mg by mouth daily.)   mirabegron ER (MYRBETRIQ) 50 MG TB24 tablet Take 1 tablet (50 mg total) by mouth daily.   oxyCODONE (ROXICODONE) 15 MG immediate release tablet Take 15 mg by mouth 4 (four) times daily as needed for pain.   OXYGEN Inhale 2 L into the lungs as needed (shortness of breath).   potassium chloride (KLOR-CON) 10 MEQ tablet Take 1 tablet (10 mEq total) by mouth 2 (two) times daily.   PRESCRIPTION MEDICATION Inhale into the lungs at bedtime. cpap   rivaroxaban (XARELTO) 20 MG TABS tablet Take 1 tablet (20 mg total) by mouth every morning.   tiotropium (SPIRIVA HANDIHALER) 18 MCG inhalation capsule PLACE 1 CAPSULE INTO INHALER AND INHALE DAILY (Patient taking differently: Place 18 mcg into inhaler and inhale daily.)   torsemide (DEMADEX) 20 MG tablet TAKE 2 TABLETS BY MOUTH DAILY (Patient taking differently: Take 40 mg by mouth daily.)     Review of Systems       All other systems reviewed and are otherwise negative except as noted above.  Physical Exam    VS:  BP (!) 96/54   Pulse 86   Ht '6\' 2"'$  (1.88 m)   BMI 48.79 kg/m  , BMI Body mass index is 48.79 kg/m.  Wt Readings from Last 3 Encounters:  10/07/20 (!) 380 lb (172.4 kg)  10/07/20 (!) 382 lb 4.8 oz (173.4 kg)  09/09/20 (!) 393 lb 6.4 oz (178.4 kg)     GEN: Well nourished, obese, well developed, in no acute distress. HEENT: normal. Neck: Supple, no JVD, carotid bruits, or masses. Cardiac: RRR, no murmurs, rubs, or gallops. No clubbing, cyanosis, edema.  Radials/PT 2+ and equal bilaterally.  Respiratory:  Respirations regular and unlabored, clear to auscultation bilaterally. GI: Soft, nontender, nondistended. MS: No deformity or atrophy. Skin: Warm and dry, no rash. Neuro:  Strength and sensation are intact. Psych: Normal affect.  Assessment & Plan    HFpEF-volume status difficult to ascertain given morbid obesity.  Continue torsemide 40 mg daily with additional 20 mg as needed for weight gain 2 pounds overnight or 5 pounds in 1 week.  Encourage low-salt, heart healthy diet.  Sodium restriction to less than 2 L encouraged.  Venous stasis ulcer -Notes poor healing to left lower extremity venous stasis ulcer.  Unable to view on exam visit nonreactive by home health.  Will order Unna boots to be placed by home health.  Upcoming appointment to establish with vascular.  HTN- BP hypotensive today.  Stop hydrochlorothiazide as this is duplicate therapy with torsemide.  We will discontinue benazepril.  Continue Imdur 30 mg daily, metoprolol succinate 25 mg daily, torsemide 40 mg daily.  Obesity- Weight loss via diet and exercise encouraged. Discussed the impact being overweight would have on cardiovascular risk.   Persistent atrial fibrillation/chronic anticoagulation -overall asymptomatic with no palpitations.  Rate controlled today by EKG.  Continue Xarelto 20 mg daily.  As she is rate  controlled we will continue metoprolol succinate 25 mg daily.  For elevated rate consider increased dose.  Nonobstructive CAD - Stable with no anginal symptoms. No indication for ischemic evaluation.  GDMT includes Imdur, beta-blocker, statin.  No recurrent chest pain since the addition of Imdur.  No aspirin due to chronic anticoagulation.  COPD Myrtie Cruise  use - Continue to follow with PCP. Smoking cessation encouraged. Recommend utilization of 1800QUITNOW.   [place] (phone # given [phone]) and left VM on secure line that LLE Unna boot would be appropriate.   Disposition: Follow up in 2 month(s) with Dr. Gwenlyn Found or APP.  Signed, Loel Dubonnet, NP 10/22/2020, 2:11 PM [facility]

## 2020-10-24 ENCOUNTER — Encounter: Payer: Self-pay | Admitting: *Deleted

## 2020-10-24 NOTE — Progress Notes (Signed)

## 2020-10-25 ENCOUNTER — Telehealth: Payer: Self-pay | Admitting: *Deleted

## 2020-10-25 ENCOUNTER — Ambulatory Visit: Payer: Medicare Other | Admitting: *Deleted

## 2020-10-25 DIAGNOSIS — I5032 Chronic diastolic (congestive) heart failure: Secondary | ICD-10-CM

## 2020-10-25 DIAGNOSIS — I83029 Varicose veins of left lower extremity with ulcer of unspecified site: Secondary | ICD-10-CM

## 2020-10-25 DIAGNOSIS — M1612 Unilateral primary osteoarthritis, left hip: Secondary | ICD-10-CM

## 2020-10-25 DIAGNOSIS — J9611 Chronic respiratory failure with hypoxia: Secondary | ICD-10-CM

## 2020-10-25 DIAGNOSIS — L97929 Non-pressure chronic ulcer of unspecified part of left lower leg with unspecified severity: Secondary | ICD-10-CM

## 2020-10-25 DIAGNOSIS — J449 Chronic obstructive pulmonary disease, unspecified: Secondary | ICD-10-CM

## 2020-10-25 DIAGNOSIS — I10 Essential (primary) hypertension: Secondary | ICD-10-CM

## 2020-10-25 NOTE — Telephone Encounter (Signed)
  Care Management   Outreach Note  10/25/2020 Name: Sharon Vasquez [name]: [id number] DOB: [birth year]  Referred by: [name], DO Reason for referral : Care Coordination (HF, a fib, HTN, HLD, hx of PE and DVT, PVD, COPD- on O2, ILD, OSA, GERD, OA, anxiety/depression, chronic pain syndrome, incontinence- uses /[place], morbid obesity )   An unsuccessful telephone outreach was attempted today in response to message left for this CCM RN on 7/19/2 at 12:43 pm.  The patient was referred to the case management team for assistance with care management and care coordination.   Follow Up Plan: A HIPAA compliant phone message was left for the patient providing contact information and requesting a return call.  Will attempt to reach patient again later today.  Kelli Churn RN, CCM, [facility] RN Care Manager [phone]

## 2020-10-25 NOTE — Progress Notes (Signed)
Internal Medicine Clinic Attending ° °I saw and evaluated the patient.  I personally confirmed the key portions of the history and exam documented by Dr. Atway and I reviewed pertinent patient test results.  The assessment, diagnosis, and plan were formulated together and I agree with the documentation in the resident’s note.  °

## 2020-10-25 NOTE — Chronic Care Management (AMB) (Signed)
  Care Management   Note  10/25/2020 Name: Sharon Vasquez [name]: [id number] DOB: 1950/05/13  [name] is enrolled in a Managed Medicaid plan: No. Outreach attempt today was successful.   In response to voice message left by patient on 10/19/20, reached patient via telephone successfully on 2nd attempt today. She states the [facility] answered her questions regarding the results of her hip X-rays and echocardiogram. She says the cardiology PA she saw on 7/22 ordered Unna Boot application by the [facility] to treat her left leg venous status ulcer. She says the [facility] sees her twice weekly and did not come today. Advised patient the role of the clinic CCM RN has transitioned to [employer]. Patient voiced appreciation for the help this RNCM has provided.  The care management team will reach out to the patient again over the next 30 days.   Kelli Churn RN, CCM, [facility] RN Care Manager [phone]

## 2020-10-25 NOTE — Progress Notes (Signed)
Things That May Be Affecting Your Health:  Alcohol  Hearing loss  Pain   x Depression  Home Safety  Sexual Health   Diabetes x Lack of physical activity  Stress  x Difficulty with daily activities  Loneliness  Tiredness   Drug use  Medicines  Tobacco use  x Falls  Motor Vehicle Safety  Weight   Food choices  Oral Health  Other    YOUR PERSONALIZED HEALTH PLAN : 1. Schedule your next subsequent Medicare Wellness visit in one year 2. Attend all of your regular appointments to address your medical issues 3. Complete the preventative screenings and services   Annual Wellness Visit   Medicare Covered Preventative Screenings and [place] Men and Women Who How Often Need? Date of Last Service Action  Abdominal Aortic Aneurysm Adults with AAA risk factors Once      Alcohol Misuse and Counseling All Adults Screening once a year if no alcohol misuse. Counseling up to 4 face to face sessions.     Bone Density Measurement  Adults at risk for osteoporosis Once every 2 yrs      Lipid Panel Z13.6 All adults without CV disease Once every 5 yrs       Colorectal Cancer  Stool sample or Colonoscopy All adults 70 and older  Once every year Every 10 years X       Depression All Adults Once a year  Today   Diabetes Screening Blood glucose, post glucose load, or GTT Z13.1 All adults at risk Pre-diabetics Once per year Twice per year      Diabetes  Self-Management Training All adults Diabetics 10 hrs first year; 2 hours subsequent years. Requires Copay     Glaucoma Diabetics Family history of glaucoma African Americans 66 yrs + Hispanic Americans 31 yrs + Annually - requires coppay      Hepatitis C Z72.89 or F19.20 High Risk for HCV Born between 1945 and 1965 Annually Once      HIV Z11.4 All adults based on risk Annually btw ages 41 & 8 regardless of risk Annually > 65 yrs if at increased risk      Lung Cancer Screening Asymptomatic adults aged 52-77 with 30  pack yr history and current smoker OR quit within the last 15 yrs Annually Must have counseling and shared decision making documentation before first screen      Medical Nutrition Therapy Adults with  Diabetes Renal disease Kidney transplant within past 3 yrs 3 hours first year; 2 hours subsequent years     Obesity and Counseling All adults Screening once a year Counseling if BMI 30 or higher  Today   Tobacco Use Counseling Adults who use tobacco  Up to 8 visits in one year     Vaccines Z23 Hepatitis B Influenza  Pneumonia  Adults  Once Once every flu season Two different vaccines separated by one year X Zoster, COVID    Next Annual Wellness Visit People with Medicare Every year  Today     Services & Screenings Women Who How Often Need  Date of Last Service Action  Mammogram  Z12.31 Women over 79 One baseline ages 61-39. Annually ager 40 yrs+      Pap tests All women Annually if high risk. Every 2 yrs for normal risk women      Screening for cervical cancer with  Pap (Z01.419 nl or Z01.411abnl) & HPV Z11.51 Women aged 88 to 31 Once every 5 yrs  Screening pelvic and breast exams All women Annually if high risk. Every 2 yrs for normal risk women     Sexually Transmitted Diseases Chlamydia Gonorrhea Syphilis All at risk adults Annually for non pregnant females at increased risk         [place] Men Who How Ofter Need  Date of Last Service Action  Prostate Cancer - DRE & PSA Men over 50 Annually.  DRE might require a copay.        Sexually Transmitted Diseases Syphilis All at risk adults Annually for men at increased risk      Health Maintenance List Health Maintenance  Topic Date Due   Zoster Vaccines- Shingrix (1 of 2) Never done   COVID-19 Vaccine (4 - Booster) 06/15/2020   COLON CANCER SCREENING ANNUAL FOBT  08/20/2020   INFLUENZA VACCINE  11/01/2020   MAMMOGRAM  06/30/2021   TETANUS/TDAP  06/04/2029   DEXA SCAN  Completed   Hepatitis C  Screening  Completed   PNA vac Low Risk Adult  Completed   HPV VACCINES  Aged Out   COLONOSCOPY (Pts 45-72yr Insurance coverage will need to be confirmed)  Discontinued

## 2020-10-26 DIAGNOSIS — G8929 Other chronic pain: Secondary | ICD-10-CM | POA: Diagnosis not present

## 2020-10-26 DIAGNOSIS — Z86711 Personal history of pulmonary embolism: Secondary | ICD-10-CM | POA: Diagnosis not present

## 2020-10-26 DIAGNOSIS — G4730 Sleep apnea, unspecified: Secondary | ICD-10-CM | POA: Diagnosis not present

## 2020-10-26 DIAGNOSIS — I739 Peripheral vascular disease, unspecified: Secondary | ICD-10-CM | POA: Diagnosis not present

## 2020-10-26 DIAGNOSIS — I110 Hypertensive heart disease with heart failure: Secondary | ICD-10-CM | POA: Diagnosis not present

## 2020-10-26 DIAGNOSIS — J449 Chronic obstructive pulmonary disease, unspecified: Secondary | ICD-10-CM | POA: Diagnosis not present

## 2020-10-26 DIAGNOSIS — I4891 Unspecified atrial fibrillation: Secondary | ICD-10-CM | POA: Diagnosis not present

## 2020-10-26 DIAGNOSIS — E785 Hyperlipidemia, unspecified: Secondary | ICD-10-CM | POA: Diagnosis not present

## 2020-10-26 DIAGNOSIS — M545 Low back pain, unspecified: Secondary | ICD-10-CM | POA: Diagnosis not present

## 2020-10-26 DIAGNOSIS — J9611 Chronic respiratory failure with hypoxia: Secondary | ICD-10-CM | POA: Diagnosis not present

## 2020-10-26 DIAGNOSIS — Z87891 Personal history of nicotine dependence: Secondary | ICD-10-CM | POA: Diagnosis not present

## 2020-10-26 DIAGNOSIS — I872 Venous insufficiency (chronic) (peripheral): Secondary | ICD-10-CM | POA: Diagnosis not present

## 2020-10-26 DIAGNOSIS — K219 Gastro-esophageal reflux disease without esophagitis: Secondary | ICD-10-CM | POA: Diagnosis not present

## 2020-10-26 DIAGNOSIS — M170 Bilateral primary osteoarthritis of knee: Secondary | ICD-10-CM | POA: Diagnosis not present

## 2020-10-26 DIAGNOSIS — M810 Age-related osteoporosis without current pathological fracture: Secondary | ICD-10-CM | POA: Diagnosis not present

## 2020-10-26 DIAGNOSIS — Z86718 Personal history of other venous thrombosis and embolism: Secondary | ICD-10-CM | POA: Diagnosis not present

## 2020-10-26 DIAGNOSIS — Z9981 Dependence on supplemental oxygen: Secondary | ICD-10-CM | POA: Diagnosis not present

## 2020-10-26 DIAGNOSIS — I5032 Chronic diastolic (congestive) heart failure: Secondary | ICD-10-CM | POA: Diagnosis not present

## 2020-10-26 DIAGNOSIS — L97822 Non-pressure chronic ulcer of other part of left lower leg with fat layer exposed: Secondary | ICD-10-CM | POA: Diagnosis not present

## 2020-10-26 DIAGNOSIS — G4733 Obstructive sleep apnea (adult) (pediatric): Secondary | ICD-10-CM | POA: Diagnosis not present

## 2020-10-27 DIAGNOSIS — Z9981 Dependence on supplemental oxygen: Secondary | ICD-10-CM | POA: Diagnosis not present

## 2020-10-27 DIAGNOSIS — I872 Venous insufficiency (chronic) (peripheral): Secondary | ICD-10-CM | POA: Diagnosis not present

## 2020-10-27 DIAGNOSIS — G4730 Sleep apnea, unspecified: Secondary | ICD-10-CM | POA: Diagnosis not present

## 2020-10-27 DIAGNOSIS — L97822 Non-pressure chronic ulcer of other part of left lower leg with fat layer exposed: Secondary | ICD-10-CM | POA: Diagnosis not present

## 2020-10-27 DIAGNOSIS — G4733 Obstructive sleep apnea (adult) (pediatric): Secondary | ICD-10-CM | POA: Diagnosis not present

## 2020-10-27 DIAGNOSIS — Z86718 Personal history of other venous thrombosis and embolism: Secondary | ICD-10-CM | POA: Diagnosis not present

## 2020-10-27 DIAGNOSIS — K219 Gastro-esophageal reflux disease without esophagitis: Secondary | ICD-10-CM | POA: Diagnosis not present

## 2020-10-27 DIAGNOSIS — G8929 Other chronic pain: Secondary | ICD-10-CM | POA: Diagnosis not present

## 2020-10-27 DIAGNOSIS — E785 Hyperlipidemia, unspecified: Secondary | ICD-10-CM | POA: Diagnosis not present

## 2020-10-27 DIAGNOSIS — M545 Low back pain, unspecified: Secondary | ICD-10-CM | POA: Diagnosis not present

## 2020-10-27 DIAGNOSIS — Z87891 Personal history of nicotine dependence: Secondary | ICD-10-CM | POA: Diagnosis not present

## 2020-10-27 DIAGNOSIS — J9611 Chronic respiratory failure with hypoxia: Secondary | ICD-10-CM | POA: Diagnosis not present

## 2020-10-27 DIAGNOSIS — I5032 Chronic diastolic (congestive) heart failure: Secondary | ICD-10-CM | POA: Diagnosis not present

## 2020-10-27 DIAGNOSIS — Z86711 Personal history of pulmonary embolism: Secondary | ICD-10-CM | POA: Diagnosis not present

## 2020-10-27 DIAGNOSIS — M170 Bilateral primary osteoarthritis of knee: Secondary | ICD-10-CM | POA: Diagnosis not present

## 2020-10-27 DIAGNOSIS — I110 Hypertensive heart disease with heart failure: Secondary | ICD-10-CM | POA: Diagnosis not present

## 2020-10-27 DIAGNOSIS — I4891 Unspecified atrial fibrillation: Secondary | ICD-10-CM | POA: Diagnosis not present

## 2020-10-27 DIAGNOSIS — M810 Age-related osteoporosis without current pathological fracture: Secondary | ICD-10-CM | POA: Diagnosis not present

## 2020-10-27 DIAGNOSIS — J449 Chronic obstructive pulmonary disease, unspecified: Secondary | ICD-10-CM | POA: Diagnosis not present

## 2020-10-27 DIAGNOSIS — I739 Peripheral vascular disease, unspecified: Secondary | ICD-10-CM | POA: Diagnosis not present

## 2020-10-28 ENCOUNTER — Other Ambulatory Visit: Payer: Self-pay

## 2020-10-28 DIAGNOSIS — I739 Peripheral vascular disease, unspecified: Secondary | ICD-10-CM

## 2020-10-29 DIAGNOSIS — M810 Age-related osteoporosis without current pathological fracture: Secondary | ICD-10-CM | POA: Diagnosis not present

## 2020-10-29 DIAGNOSIS — Z86718 Personal history of other venous thrombosis and embolism: Secondary | ICD-10-CM | POA: Diagnosis not present

## 2020-10-29 DIAGNOSIS — I872 Venous insufficiency (chronic) (peripheral): Secondary | ICD-10-CM | POA: Diagnosis not present

## 2020-10-29 DIAGNOSIS — Z9981 Dependence on supplemental oxygen: Secondary | ICD-10-CM | POA: Diagnosis not present

## 2020-10-29 DIAGNOSIS — I739 Peripheral vascular disease, unspecified: Secondary | ICD-10-CM | POA: Diagnosis not present

## 2020-10-29 DIAGNOSIS — G4733 Obstructive sleep apnea (adult) (pediatric): Secondary | ICD-10-CM | POA: Diagnosis not present

## 2020-10-29 DIAGNOSIS — Z87891 Personal history of nicotine dependence: Secondary | ICD-10-CM | POA: Diagnosis not present

## 2020-10-29 DIAGNOSIS — I110 Hypertensive heart disease with heart failure: Secondary | ICD-10-CM | POA: Diagnosis not present

## 2020-10-29 DIAGNOSIS — G8929 Other chronic pain: Secondary | ICD-10-CM | POA: Diagnosis not present

## 2020-10-29 DIAGNOSIS — L97822 Non-pressure chronic ulcer of other part of left lower leg with fat layer exposed: Secondary | ICD-10-CM | POA: Diagnosis not present

## 2020-10-29 DIAGNOSIS — Z86711 Personal history of pulmonary embolism: Secondary | ICD-10-CM | POA: Diagnosis not present

## 2020-10-29 DIAGNOSIS — J449 Chronic obstructive pulmonary disease, unspecified: Secondary | ICD-10-CM | POA: Diagnosis not present

## 2020-10-29 DIAGNOSIS — K219 Gastro-esophageal reflux disease without esophagitis: Secondary | ICD-10-CM | POA: Diagnosis not present

## 2020-10-29 DIAGNOSIS — M170 Bilateral primary osteoarthritis of knee: Secondary | ICD-10-CM | POA: Diagnosis not present

## 2020-10-29 DIAGNOSIS — M545 Low back pain, unspecified: Secondary | ICD-10-CM | POA: Diagnosis not present

## 2020-10-29 DIAGNOSIS — J9611 Chronic respiratory failure with hypoxia: Secondary | ICD-10-CM | POA: Diagnosis not present

## 2020-10-29 DIAGNOSIS — G4730 Sleep apnea, unspecified: Secondary | ICD-10-CM | POA: Diagnosis not present

## 2020-10-29 DIAGNOSIS — I5032 Chronic diastolic (congestive) heart failure: Secondary | ICD-10-CM | POA: Diagnosis not present

## 2020-10-29 DIAGNOSIS — I4891 Unspecified atrial fibrillation: Secondary | ICD-10-CM | POA: Diagnosis not present

## 2020-10-29 DIAGNOSIS — E785 Hyperlipidemia, unspecified: Secondary | ICD-10-CM | POA: Diagnosis not present

## 2020-11-02 DIAGNOSIS — L97822 Non-pressure chronic ulcer of other part of left lower leg with fat layer exposed: Secondary | ICD-10-CM | POA: Diagnosis not present

## 2020-11-02 DIAGNOSIS — Z87891 Personal history of nicotine dependence: Secondary | ICD-10-CM | POA: Diagnosis not present

## 2020-11-02 DIAGNOSIS — G4730 Sleep apnea, unspecified: Secondary | ICD-10-CM | POA: Diagnosis not present

## 2020-11-02 DIAGNOSIS — M545 Low back pain, unspecified: Secondary | ICD-10-CM | POA: Diagnosis not present

## 2020-11-02 DIAGNOSIS — Z86711 Personal history of pulmonary embolism: Secondary | ICD-10-CM | POA: Diagnosis not present

## 2020-11-02 DIAGNOSIS — J9611 Chronic respiratory failure with hypoxia: Secondary | ICD-10-CM | POA: Diagnosis not present

## 2020-11-02 DIAGNOSIS — I872 Venous insufficiency (chronic) (peripheral): Secondary | ICD-10-CM | POA: Diagnosis not present

## 2020-11-02 DIAGNOSIS — J449 Chronic obstructive pulmonary disease, unspecified: Secondary | ICD-10-CM | POA: Diagnosis not present

## 2020-11-02 DIAGNOSIS — I110 Hypertensive heart disease with heart failure: Secondary | ICD-10-CM | POA: Diagnosis not present

## 2020-11-02 DIAGNOSIS — I5032 Chronic diastolic (congestive) heart failure: Secondary | ICD-10-CM | POA: Diagnosis not present

## 2020-11-02 DIAGNOSIS — M170 Bilateral primary osteoarthritis of knee: Secondary | ICD-10-CM | POA: Diagnosis not present

## 2020-11-02 DIAGNOSIS — M810 Age-related osteoporosis without current pathological fracture: Secondary | ICD-10-CM | POA: Diagnosis not present

## 2020-11-02 DIAGNOSIS — Z9981 Dependence on supplemental oxygen: Secondary | ICD-10-CM | POA: Diagnosis not present

## 2020-11-02 DIAGNOSIS — G8929 Other chronic pain: Secondary | ICD-10-CM | POA: Diagnosis not present

## 2020-11-02 DIAGNOSIS — G4733 Obstructive sleep apnea (adult) (pediatric): Secondary | ICD-10-CM | POA: Diagnosis not present

## 2020-11-02 DIAGNOSIS — I4891 Unspecified atrial fibrillation: Secondary | ICD-10-CM | POA: Diagnosis not present

## 2020-11-02 DIAGNOSIS — E785 Hyperlipidemia, unspecified: Secondary | ICD-10-CM | POA: Diagnosis not present

## 2020-11-02 DIAGNOSIS — K219 Gastro-esophageal reflux disease without esophagitis: Secondary | ICD-10-CM | POA: Diagnosis not present

## 2020-11-02 DIAGNOSIS — I739 Peripheral vascular disease, unspecified: Secondary | ICD-10-CM | POA: Diagnosis not present

## 2020-11-02 DIAGNOSIS — Z86718 Personal history of other venous thrombosis and embolism: Secondary | ICD-10-CM | POA: Diagnosis not present

## 2020-11-03 ENCOUNTER — Telehealth: Payer: Self-pay

## 2020-11-03 ENCOUNTER — Telehealth: Payer: Medicare Other

## 2020-11-03 DIAGNOSIS — G4730 Sleep apnea, unspecified: Secondary | ICD-10-CM | POA: Diagnosis not present

## 2020-11-03 DIAGNOSIS — M545 Low back pain, unspecified: Secondary | ICD-10-CM | POA: Diagnosis not present

## 2020-11-03 DIAGNOSIS — J9611 Chronic respiratory failure with hypoxia: Secondary | ICD-10-CM | POA: Diagnosis not present

## 2020-11-03 DIAGNOSIS — G8929 Other chronic pain: Secondary | ICD-10-CM | POA: Diagnosis not present

## 2020-11-03 DIAGNOSIS — E785 Hyperlipidemia, unspecified: Secondary | ICD-10-CM | POA: Diagnosis not present

## 2020-11-03 DIAGNOSIS — Z87891 Personal history of nicotine dependence: Secondary | ICD-10-CM | POA: Diagnosis not present

## 2020-11-03 DIAGNOSIS — Z9981 Dependence on supplemental oxygen: Secondary | ICD-10-CM | POA: Diagnosis not present

## 2020-11-03 DIAGNOSIS — M810 Age-related osteoporosis without current pathological fracture: Secondary | ICD-10-CM | POA: Diagnosis not present

## 2020-11-03 DIAGNOSIS — I110 Hypertensive heart disease with heart failure: Secondary | ICD-10-CM | POA: Diagnosis not present

## 2020-11-03 DIAGNOSIS — I4891 Unspecified atrial fibrillation: Secondary | ICD-10-CM | POA: Diagnosis not present

## 2020-11-03 DIAGNOSIS — Z86718 Personal history of other venous thrombosis and embolism: Secondary | ICD-10-CM | POA: Diagnosis not present

## 2020-11-03 DIAGNOSIS — J449 Chronic obstructive pulmonary disease, unspecified: Secondary | ICD-10-CM | POA: Diagnosis not present

## 2020-11-03 DIAGNOSIS — I5032 Chronic diastolic (congestive) heart failure: Secondary | ICD-10-CM | POA: Diagnosis not present

## 2020-11-03 DIAGNOSIS — L97822 Non-pressure chronic ulcer of other part of left lower leg with fat layer exposed: Secondary | ICD-10-CM | POA: Diagnosis not present

## 2020-11-03 DIAGNOSIS — M170 Bilateral primary osteoarthritis of knee: Secondary | ICD-10-CM | POA: Diagnosis not present

## 2020-11-03 DIAGNOSIS — Z86711 Personal history of pulmonary embolism: Secondary | ICD-10-CM | POA: Diagnosis not present

## 2020-11-03 DIAGNOSIS — K219 Gastro-esophageal reflux disease without esophagitis: Secondary | ICD-10-CM | POA: Diagnosis not present

## 2020-11-03 DIAGNOSIS — I872 Venous insufficiency (chronic) (peripheral): Secondary | ICD-10-CM | POA: Diagnosis not present

## 2020-11-03 DIAGNOSIS — I739 Peripheral vascular disease, unspecified: Secondary | ICD-10-CM | POA: Diagnosis not present

## 2020-11-03 DIAGNOSIS — G4733 Obstructive sleep apnea (adult) (pediatric): Secondary | ICD-10-CM | POA: Diagnosis not present

## 2020-11-03 NOTE — Telephone Encounter (Signed)
  Care Management   Outreach Note  11/03/2020 Name: LANIESHA SMERIGLIO [name]: [id number] DOB: 1950/09/05  Referred by: [name], DO Reason for referral : No chief complaint on file.   An unsuccessful telephone outreach was attempted today. The patient was referred to the case management team for assistance with care management and care coordination.   Follow Up Plan: If patient returns call to provider office, please advise to call [place] at [phone].  Johnney Killian, RN, BSN, CCM Care Management Coordinator [facility] Phone: [phone] / Fax: [phone]

## 2020-11-04 ENCOUNTER — Ambulatory Visit (INDEPENDENT_AMBULATORY_CARE_PROVIDER_SITE_OTHER)
Admission: RE | Admit: 2020-11-04 | Discharge: 2020-11-04 | Disposition: A | Discharge status: Home / Self Care | Payer: Medicare Other | Source: Ambulatory Visit | Attending: Vascular Surgery | Admitting: Vascular Surgery

## 2020-11-04 ENCOUNTER — Ambulatory Visit (HOSPITAL_COMMUNITY)
Admission: RE | Admit: 2020-11-04 | Discharge: 2020-11-04 | Disposition: A | Discharge status: Home / Self Care | Payer: Medicare Other | Source: Ambulatory Visit | Attending: Vascular Surgery | Admitting: Vascular Surgery

## 2020-11-04 ENCOUNTER — Other Ambulatory Visit: Payer: Self-pay

## 2020-11-04 ENCOUNTER — Ambulatory Visit (INDEPENDENT_AMBULATORY_CARE_PROVIDER_SITE_OTHER): Payer: Medicare Other | Admitting: Vascular Surgery

## 2020-11-04 ENCOUNTER — Encounter: Payer: Self-pay | Admitting: Vascular Surgery

## 2020-11-04 VITALS — BP 105/65 | HR 95 | Temp 97.9°F | Resp 20 | Ht 74.0 in | Wt 380.0 lb

## 2020-11-04 DIAGNOSIS — I872 Venous insufficiency (chronic) (peripheral): Secondary | ICD-10-CM

## 2020-11-04 DIAGNOSIS — I739 Peripheral vascular disease, unspecified: Secondary | ICD-10-CM | POA: Insufficient documentation

## 2020-11-04 NOTE — Progress Notes (Signed)
Patient name: Sharon Vasquez [name]: [id number] DOB: April 15, 1950 Sex: female  REASON FOR CONSULT: Left leg ulcer  HPI: Sharon Vasquez is a 70 y.o. female, referred for evaluation of a left leg ulcer.  She has had 2 prior exacerbation and remission of her venous ulcers over the last several years.  These were both in the left leg.  She had an Unna boot placed on her left leg recently.  She states that the ulcer had been present on her left leg for several months and local wound care had not resolved this.  She states the ulcer essentially healed with 4 days of Unna boot therapy.  She does currently smoke about 5 cigarettes/day.  She was counseled against this.  She also knows that she is overweight and is trying to lose some weight.  She states she recently went from 410 pounds to 340.  She has not really been compliant wearing compression stockings because she thinks they feel too tight and they do not fit well.  She also has previously been evaluated by Dr. Berenice Primas for total knee replacement but was felt not to be a candidate.  She is nearly wheelchair-bound.  She states she does work with physical therapy and moves from the bed to the chair.  She is in a motorized wheelchair on her office visit today.  She is also on home oxygen.  She was previously seen by my partner Dr. Scot Dock in 2018.  Per his note the patient has previously had left common iliac and external iliac vein stenting in [place].  At the time of that office visit compression and leg elevation was recommended.  Other medical problems include atrial fibrillation, degenerative arthritis, congestive heart failure, chronic low back pain, COPD, hypertension, hyperlipidemia, obesity, sleep apnea.  All of which have been stable.  She is on Xarelto.  She is also on as needed home oxygen.  Past Medical History:  Diagnosis Date   A-fib [facility])    Anxiety    Arthritis    "qwhre; joints, back" (04/17/2017)   Benign breast cyst in female, left 01/08/2017    Found by Screening mammogram, evaluated by U/S on 01/08/17 and determined to be a benign simple breast cyst.   Cellulitis of left lower leg 05/30/2017   CHF (congestive heart failure) (HCC)    Chronic low back pain 08/21/2016   Chronic lower back pain    Chronic venous insufficiency    /notes 05/30/2017   COPD (chronic obstructive pulmonary disease) (HCC)    Depression    DVT (deep venous thrombosis) ([place]) 11/16/2016   GERD (gastroesophageal reflux disease)    Headache    "weekly for the last 3 months" (04/17/2017)   Hyperlipidemia    Hypertension    Morbid obesity ([place])    PE (pulmonary embolism)    Pulmonary embolism ([place]) 09/21/2014   Sleep apnea    Past Surgical History:  Procedure Laterality Date   ABDOMINAL HYSTERECTOMY     APPENDECTOMY     BALLOON DILATION N/A 03/09/2020   Procedure: BALLOON DILATION;  Surgeon: Otis Brace, MD;  Location: [facility];  Service: Gastroenterology;  Laterality: N/A;   BIOPSY  03/09/2020   Procedure: BIOPSY;  Surgeon: Otis Brace, MD;  Location: [facility];  Service: Gastroenterology;;   BREAST CYST EXCISION Left    "six o'clock"   BREAST LUMPECTOMY Left    CHOLECYSTECTOMY     DILATION AND CURETTAGE OF UTERUS     ESOPHAGOGASTRODUODENOSCOPY (EGD) WITH PROPOFOL  N/A 03/09/2020   Procedure: ESOPHAGOGASTRODUODENOSCOPY (EGD) WITH PROPOFOL;  Surgeon: Otis Brace, MD;  Location: [facility];  Service: Gastroenterology;  Laterality: N/A;   IR ABLATE LIVER CRYOABLATION  07/23/2019   IR RADIOLOGIST EVAL & MGMT  07/18/2019   TONSILLECTOMY AND ADENOIDECTOMY     TUBAL LIGATION      Family History  Problem Relation Age of Onset   Breast cancer Mother    Hypertension Mother    Hyperlipidemia Mother    Hypertension Maternal Grandfather    Hyperlipidemia Maternal Grandfather     SOCIAL HISTORY: Social History   Socioeconomic History   Marital status: Divorced    Spouse name: Not on file   Number of children: Not on file   Years  of education: Not on file   Highest education level: Not on file  Occupational History   Not on file  Tobacco Use   Smoking status: Former    Packs/day: 2.00    Years: 55.00    Pack years: 110.00    Types: Cigarettes    Start date: 08/16/1961    Quit date: 05/03/2020    Years since quitting: 0.5   Smokeless tobacco: Never  Vaping Use   Vaping Use: Never used  Substance and Sexual Activity   Alcohol use: No   Drug use: No   Sexual activity: Not Currently    Partners: Male  Other Topics Concern   Not on file  Social History Narrative   Lives in [facility] in [place]. Lives alone, but is dependent in ADLs/IADLs. Previously had [facility] and RN but dismissed them with plans to use the [employer]. Two daughters live nearby. Previously resided in [place].   Social Determinants of Health   Financial Resource Strain: Not on file  Food Insecurity: Not on file  Transportation Needs: Not on file  Physical Activity: Not on file  Stress: Not on file  Social Connections: Not on file  Intimate Partner Violence: Not on file    No Known Allergies  Current Outpatient Medications  Medication Sig Dispense Refill   albuterol (VENTOLIN HFA) 108 (90 Base) MCG/ACT inhaler Inhale into the lungs.     aspirin EC 81 MG tablet Take 81 mg by mouth daily.     atorvastatin (LIPITOR) 80 MG tablet TAKE 1 TABLET(80 MG) BY MOUTH DAILY 90 tablet 0   esomeprazole (NEXIUM) 40 MG capsule Take 1 capsule (40 mg total) by mouth daily as needed (for heartburn or indigestion). (Patient taking differently: Take 40 mg by mouth daily.) 90 capsule 1   Fluticasone-Salmeterol (ADVAIR) 250-50 MCG/DOSE AEPB Inhale 1 puff into the lungs 2 (two) times daily. 60 each 11   gabapentin (NEURONTIN) 400 MG capsule TAKE 1 CAPSULE BY MOUTH  TWICE DAILY (Patient taking differently: Take 400 mg by mouth 2 (two) times daily.) 180 capsule 3   ipratropium-albuterol (DUONEB) 0.5-2.5 (3) MG/3ML SOLN Take 3 mLs by nebulization every 6  (six) hours as needed. (Patient taking differently: Take 3 mLs by nebulization every 6 (six) hours as needed (shortness of breath/wheezing).) 360 mL 3   isosorbide mononitrate (IMDUR) 30 MG 24 hr tablet TAKE 1 TABLET(30 MG) BY MOUTH DAILY 90 tablet 0   metoprolol succinate (TOPROL XL) 25 MG 24 hr tablet Take 1 tablet (25 mg total) by mouth daily. 90 tablet 3   mirabegron ER (MYRBETRIQ) 50 MG TB24 tablet Take 1 tablet (50 mg total) by mouth daily. 90 tablet 1   oxyCODONE (ROXICODONE) 15 MG immediate release  tablet Take 15 mg by mouth 4 (four) times daily as needed for pain.     OXYGEN Inhale 2 L into the lungs as needed (shortness of breath).     potassium chloride (KLOR-CON) 10 MEQ tablet Take 1 tablet (10 mEq total) by mouth 2 (two) times daily. 180 tablet 1   PRESCRIPTION MEDICATION Inhale into the lungs at bedtime. cpap     rivaroxaban (XARELTO) 20 MG TABS tablet Take 1 tablet (20 mg total) by mouth every morning. 90 tablet 3   tiotropium (SPIRIVA HANDIHALER) 18 MCG inhalation capsule PLACE 1 CAPSULE INTO INHALER AND INHALE DAILY (Patient taking differently: Place 18 mcg into inhaler and inhale daily.) 90 capsule 1   torsemide (DEMADEX) 20 MG tablet Take 2 tablets by mouth daily, you may take an extra 1/2 tablet only as needed for weight gain of 2 lbs overnight or 5 lbs in a week 225 tablet 1   No current facility-administered medications for this visit.    ROS:   General:  No weight loss, Fever, chills  HEENT: No recent headaches, no nasal bleeding, no visual changes, no sore throat  Neurologic: No dizziness, blackouts, seizures. No recent symptoms of stroke or mini- stroke. No recent episodes of slurred speech, or temporary blindness.  Cardiac: No recent episodes of chest pain/pressure, no shortness of breath at rest.  + shortness of breath with exertion.  + history of atrial fibrillation or irregular heartbeat  Vascular: No history of rest pain in feet.  No history of claudication.  +  history of non-healing ulcer, No history of DVT   Pulmonary: +home oxygen, no productive cough, no hemoptysis,  No asthma or wheezing  Musculoskeletal:  '[X]'$  Arthritis, '[ ]'$  Low back pain,  '[X]'$  Joint pain  Hematologic:No history of hypercoagulable state.  No history of easy bleeding.  No history of anemia  Gastrointestinal: No hematochezia or melena,  No gastroesophageal reflux, no trouble swallowing  Urinary: '[ ]'$  chronic Kidney disease, '[ ]'$  on HD - '[ ]'$  MWF or '[ ]'$  TTHS, '[ ]'$  Burning with urination, '[ ]'$  Frequent urination, '[ ]'$  Difficulty urinating;   Skin: No rashes  Psychological: No history of anxiety,  No history of depression   Physical Examination  Vitals:   11/04/20 0905  BP: 105/65  Pulse: 95  Resp: 20  Temp: 97.9 F (36.6 C)  SpO2: 95%  Weight: (!) 380 lb (172.4 kg)  Height: '6\' 2"'$  (1.88 m)    General:  Alert and oriented, no acute distress HEENT: Normal Neck: No JVD Cardiac: Regular Rate and Rhythm  Skin: No rash, thickened woody appearing skin from the knee to the foot bilaterally with some squaring of the toes suggestive of lymphedema.  She also has very fragile appearing skin on the lateral aspect of her left pretibial region which is the area of the previous ulcer.  The ulcer has completely healed. Extremity Pulses: Absent dorsalis pedis, posterior tibial pulses bilaterally Musculoskeletal: No deformity bilateral pretibial and pedal edema  Neurologic: Upper and lower extremity motor 5/5 and symmetric  DATA:  Patient had bilateral ABIs performed today.  Right side was 0.8 for left side was 0.90.  She also had a venous reflux exam today.  Left greater saphenous vein was 5 to 7 mm diameter with diffuse reflux.  There was also some reflux in the left common femoral vein.  The rest of the deep system was not well visualized.  I reviewed and interpreted the studies.  ASSESSMENT: 1.  Patient does have evidence of superficial and deep venous reflux in the left leg.  She is  not a very good candidate for laser ablation due to her severe obesity.  I discussed with her today that she needs to elevate her legs and be more compliant with her compression stockings.  She had several excuses for not being able to do this.  I discussed with her that she is going to be at very high risk for recurrent ulcerations and requiring Unna boots in the future if she does not elevate and compress her legs.  She was amenable to at least placing Ace wraps on her legs.  2.  Patient does have mild peripheral arterial disease but I do not believe that this is related to her wound healing problems.  I believe they are more venous in nature.  Her ABIs are just slightly under normal.  She does not walk enough to elicit claudication symptoms.  Again we emphasized cigarette smoking discontinuation so that her arterial flow does not continue to worsen over time.  She was also encouraged to work on improving her walking.  3.  Obesity patient has insight into her overall condition.  She states that she does have a diet and exercise plan in place.  Hopeful she will be able to lose weight over time as this would certainly improve her venous issues as well as her degenerative joint issues.   PLAN: Patient was instructed on how to place Ace wraps on her left leg today and we wrapped these for her.  She will follow-up on an as-needed basis.  See above for overall treatment summary.  Ruta Hinds, MD [facility] Office: [place], MD [facility] [place] Office: [phone]

## 2020-11-05 ENCOUNTER — Encounter: Payer: Medicare Other | Admitting: Internal Medicine

## 2020-11-05 DIAGNOSIS — F17210 Nicotine dependence, cigarettes, uncomplicated: Secondary | ICD-10-CM | POA: Diagnosis not present

## 2020-11-05 DIAGNOSIS — M7989 Other specified soft tissue disorders: Secondary | ICD-10-CM | POA: Diagnosis not present

## 2020-11-05 DIAGNOSIS — Z79899 Other long term (current) drug therapy: Secondary | ICD-10-CM | POA: Diagnosis not present

## 2020-11-05 DIAGNOSIS — M170 Bilateral primary osteoarthritis of knee: Secondary | ICD-10-CM | POA: Diagnosis not present

## 2020-11-08 ENCOUNTER — Encounter: Payer: Medicare Other | Admitting: Internal Medicine

## 2020-11-09 DIAGNOSIS — Z79899 Other long term (current) drug therapy: Secondary | ICD-10-CM | POA: Diagnosis not present

## 2020-11-10 DIAGNOSIS — I872 Venous insufficiency (chronic) (peripheral): Secondary | ICD-10-CM | POA: Diagnosis not present

## 2020-11-10 DIAGNOSIS — I110 Hypertensive heart disease with heart failure: Secondary | ICD-10-CM | POA: Diagnosis not present

## 2020-11-10 DIAGNOSIS — J9611 Chronic respiratory failure with hypoxia: Secondary | ICD-10-CM | POA: Diagnosis not present

## 2020-11-10 DIAGNOSIS — Z86711 Personal history of pulmonary embolism: Secondary | ICD-10-CM | POA: Diagnosis not present

## 2020-11-10 DIAGNOSIS — G4730 Sleep apnea, unspecified: Secondary | ICD-10-CM | POA: Diagnosis not present

## 2020-11-10 DIAGNOSIS — G4733 Obstructive sleep apnea (adult) (pediatric): Secondary | ICD-10-CM | POA: Diagnosis not present

## 2020-11-10 DIAGNOSIS — I5032 Chronic diastolic (congestive) heart failure: Secondary | ICD-10-CM | POA: Diagnosis not present

## 2020-11-10 DIAGNOSIS — E785 Hyperlipidemia, unspecified: Secondary | ICD-10-CM | POA: Diagnosis not present

## 2020-11-10 DIAGNOSIS — Z87891 Personal history of nicotine dependence: Secondary | ICD-10-CM | POA: Diagnosis not present

## 2020-11-10 DIAGNOSIS — M170 Bilateral primary osteoarthritis of knee: Secondary | ICD-10-CM | POA: Diagnosis not present

## 2020-11-10 DIAGNOSIS — I4891 Unspecified atrial fibrillation: Secondary | ICD-10-CM | POA: Diagnosis not present

## 2020-11-10 DIAGNOSIS — K219 Gastro-esophageal reflux disease without esophagitis: Secondary | ICD-10-CM | POA: Diagnosis not present

## 2020-11-10 DIAGNOSIS — M545 Low back pain, unspecified: Secondary | ICD-10-CM | POA: Diagnosis not present

## 2020-11-10 DIAGNOSIS — L97822 Non-pressure chronic ulcer of other part of left lower leg with fat layer exposed: Secondary | ICD-10-CM | POA: Diagnosis not present

## 2020-11-10 DIAGNOSIS — Z86718 Personal history of other venous thrombosis and embolism: Secondary | ICD-10-CM | POA: Diagnosis not present

## 2020-11-10 DIAGNOSIS — M810 Age-related osteoporosis without current pathological fracture: Secondary | ICD-10-CM | POA: Diagnosis not present

## 2020-11-10 DIAGNOSIS — G8929 Other chronic pain: Secondary | ICD-10-CM | POA: Diagnosis not present

## 2020-11-10 DIAGNOSIS — J449 Chronic obstructive pulmonary disease, unspecified: Secondary | ICD-10-CM | POA: Diagnosis not present

## 2020-11-10 DIAGNOSIS — Z9981 Dependence on supplemental oxygen: Secondary | ICD-10-CM | POA: Diagnosis not present

## 2020-11-10 DIAGNOSIS — I739 Peripheral vascular disease, unspecified: Secondary | ICD-10-CM | POA: Diagnosis not present

## 2020-11-12 DIAGNOSIS — I739 Peripheral vascular disease, unspecified: Secondary | ICD-10-CM | POA: Diagnosis not present

## 2020-11-12 DIAGNOSIS — L97822 Non-pressure chronic ulcer of other part of left lower leg with fat layer exposed: Secondary | ICD-10-CM | POA: Diagnosis not present

## 2020-11-12 DIAGNOSIS — I110 Hypertensive heart disease with heart failure: Secondary | ICD-10-CM | POA: Diagnosis not present

## 2020-11-12 DIAGNOSIS — Z87891 Personal history of nicotine dependence: Secondary | ICD-10-CM | POA: Diagnosis not present

## 2020-11-12 DIAGNOSIS — I4891 Unspecified atrial fibrillation: Secondary | ICD-10-CM | POA: Diagnosis not present

## 2020-11-12 DIAGNOSIS — M170 Bilateral primary osteoarthritis of knee: Secondary | ICD-10-CM | POA: Diagnosis not present

## 2020-11-12 DIAGNOSIS — M810 Age-related osteoporosis without current pathological fracture: Secondary | ICD-10-CM | POA: Diagnosis not present

## 2020-11-12 DIAGNOSIS — J9611 Chronic respiratory failure with hypoxia: Secondary | ICD-10-CM | POA: Diagnosis not present

## 2020-11-12 DIAGNOSIS — K219 Gastro-esophageal reflux disease without esophagitis: Secondary | ICD-10-CM | POA: Diagnosis not present

## 2020-11-12 DIAGNOSIS — M545 Low back pain, unspecified: Secondary | ICD-10-CM | POA: Diagnosis not present

## 2020-11-12 DIAGNOSIS — E785 Hyperlipidemia, unspecified: Secondary | ICD-10-CM | POA: Diagnosis not present

## 2020-11-12 DIAGNOSIS — G4730 Sleep apnea, unspecified: Secondary | ICD-10-CM | POA: Diagnosis not present

## 2020-11-12 DIAGNOSIS — I5032 Chronic diastolic (congestive) heart failure: Secondary | ICD-10-CM | POA: Diagnosis not present

## 2020-11-12 DIAGNOSIS — Z86718 Personal history of other venous thrombosis and embolism: Secondary | ICD-10-CM | POA: Diagnosis not present

## 2020-11-12 DIAGNOSIS — Z9981 Dependence on supplemental oxygen: Secondary | ICD-10-CM | POA: Diagnosis not present

## 2020-11-12 DIAGNOSIS — G8929 Other chronic pain: Secondary | ICD-10-CM | POA: Diagnosis not present

## 2020-11-12 DIAGNOSIS — I872 Venous insufficiency (chronic) (peripheral): Secondary | ICD-10-CM | POA: Diagnosis not present

## 2020-11-12 DIAGNOSIS — J449 Chronic obstructive pulmonary disease, unspecified: Secondary | ICD-10-CM | POA: Diagnosis not present

## 2020-11-12 DIAGNOSIS — G4733 Obstructive sleep apnea (adult) (pediatric): Secondary | ICD-10-CM | POA: Diagnosis not present

## 2020-11-12 DIAGNOSIS — Z86711 Personal history of pulmonary embolism: Secondary | ICD-10-CM | POA: Diagnosis not present

## 2020-11-15 ENCOUNTER — Other Ambulatory Visit: Payer: Self-pay

## 2020-11-15 ENCOUNTER — Ambulatory Visit: Payer: Medicare Other

## 2020-11-15 ENCOUNTER — Encounter: Payer: Self-pay | Admitting: Student

## 2020-11-15 ENCOUNTER — Ambulatory Visit (INDEPENDENT_AMBULATORY_CARE_PROVIDER_SITE_OTHER): Payer: Medicare Other | Admitting: Student

## 2020-11-15 VITALS — BP 138/88 | HR 95 | Temp 98.1°F | Ht 74.0 in | Wt 384.8 lb

## 2020-11-15 DIAGNOSIS — L97929 Non-pressure chronic ulcer of unspecified part of left lower leg with unspecified severity: Secondary | ICD-10-CM | POA: Diagnosis not present

## 2020-11-15 DIAGNOSIS — I83029 Varicose veins of left lower extremity with ulcer of unspecified site: Secondary | ICD-10-CM

## 2020-11-15 DIAGNOSIS — M170 Bilateral primary osteoarthritis of knee: Secondary | ICD-10-CM | POA: Diagnosis not present

## 2020-11-15 NOTE — Chronic Care Management (AMB) (Addendum)
Care Management    RN Visit Note  11/15/2020 Name: Sharon Vasquez [name]: [id number] DOB: 25-Apr-1950  Subjective: Sharon Vasquez is a 70 y.o. year old female who is a primary care patient of At[place], Rayann N, DO. The care management team was consulted for assistance with disease management and care coordination needs.    Engaged with patient face to face for follow up visit in response to provider referral for case management and/or care coordination services.   Consent to Services:   Ms. Tarry was given information about Care Management services today including:  Care Management services includes personalized support from designated clinical staff supervised by her physician, including individualized plan of care and coordination with other care providers 24/7 contact phone numbers for assistance for urgent and routine care needs. The patient may stop case management services at any time by phone call to the office staff.  Patient agreed to services and consent obtained.    Assessment: Patient continues to experience difficulty with dyspnea.. See Care Plan below for interventions and patient self-care actives. Follow up Plan: Patient would like continued follow-up.  CCM RNCM will outreach the patient within the next 30 days.  Patient will call office if needed prior to next encounter : Review of patient past medical history, allergies, medications, health status, including review of consultants reports, laboratory and other test data, was performed as part of comprehensive evaluation and provision of chronic care management services.   SDOH (Social Determinants of Health) assessments and interventions performed:    Care Plan  No Known Allergies  Outpatient Encounter Medications as of 11/15/2020  Medication Sig   albuterol (VENTOLIN HFA) 108 (90 Base) MCG/ACT inhaler Inhale into the lungs.   aspirin EC 81 MG tablet Take 81 mg by mouth daily.   atorvastatin (LIPITOR) 80 MG tablet TAKE 1  TABLET(80 MG) BY MOUTH DAILY   esomeprazole (NEXIUM) 40 MG capsule Take 1 capsule (40 mg total) by mouth daily as needed (for heartburn or indigestion). (Patient taking differently: Take 40 mg by mouth daily.)   Fluticasone-Salmeterol (ADVAIR) 250-50 MCG/DOSE AEPB Inhale 1 puff into the lungs 2 (two) times daily.   gabapentin (NEURONTIN) 400 MG capsule TAKE 1 CAPSULE BY MOUTH  TWICE DAILY (Patient taking differently: Take 400 mg by mouth 2 (two) times daily.)   ipratropium-albuterol (DUONEB) 0.5-2.5 (3) MG/3ML SOLN Take 3 mLs by nebulization every 6 (six) hours as needed. (Patient taking differently: Take 3 mLs by nebulization every 6 (six) hours as needed (shortness of breath/wheezing).)   isosorbide mononitrate (IMDUR) 30 MG 24 hr tablet TAKE 1 TABLET(30 MG) BY MOUTH DAILY   metoprolol succinate (TOPROL XL) 25 MG 24 hr tablet Take 1 tablet (25 mg total) by mouth daily.   mirabegron ER (MYRBETRIQ) 50 MG TB24 tablet Take 1 tablet (50 mg total) by mouth daily.   oxyCODONE (ROXICODONE) 15 MG immediate release tablet Take 15 mg by mouth 4 (four) times daily as needed for pain.   OXYGEN Inhale 2 L into the lungs as needed (shortness of breath).   potassium chloride (KLOR-CON) 10 MEQ tablet Take 1 tablet (10 mEq total) by mouth 2 (two) times daily.   PRESCRIPTION MEDICATION Inhale into the lungs at bedtime. cpap   rivaroxaban (XARELTO) 20 MG TABS tablet Take 1 tablet (20 mg total) by mouth every morning.   tiotropium (SPIRIVA HANDIHALER) 18 MCG inhalation capsule PLACE 1 CAPSULE INTO INHALER AND INHALE DAILY (Patient taking differently: Place 18 mcg into inhaler and inhale  daily.)   torsemide (DEMADEX) 20 MG tablet Take 2 tablets by mouth daily, you may take an extra 1/2 tablet only as needed for weight gain of 2 lbs overnight or 5 lbs in a week   No facility-administered encounter medications on file as of 11/15/2020.    Patient Active Problem List   Diagnosis Date Noted   Chest pain 10/08/2020    Chronic heart failure with preserved ejection fraction (HFpEF) (HCC)    Mixed simple and mucopurulent chronic bronchitis (HCC)    Paroxysmal atrial fibrillation (HCC)    Osteoarthritis of left hip 10/07/2020   Chronic venous hypertension w ulcer of l low extrem ([place]) 10/07/2020   Venous stasis ulcer of left lower extremity ([place]) 08/18/2020   Hyperlipidemia 05/14/2020   Hypertensive urgency 04/10/2020   Swallowing difficulty 11/10/2019   Chronic respiratory failure with hypoxia ([place]) 09/30/2019   Mixed incontinence, urge and stress (female) (female) 08/21/2019   Overgrown toenails 07/01/2019   Preventative health care 06/04/2019   GERD (gastroesophageal reflux disease) 01/20/2019   Coronary artery calcification seen on CT scan 04/12/2018   ILD (interstitial lung disease) ([place]) 02/13/2018   Pain and numbness of right upper extremity 02/08/2018   Long term current use of amiodarone 12/19/2017   OSA (obstructive sleep apnea) 11/14/2017   Chronic pain syndrome 07/27/2017   Major depression, recurrent, chronic ([place]) 02/01/2017   Osteoporosis 12/14/2016   Drug induced constipation 08/21/2016   Aortic atherosclerosis ([place]) 07/17/2016   Chronic venous insufficiency 07/12/2016   Chronic anticoagulation 07/12/2016   Tobacco use 07/11/2016   Acute exacerbation of chronic obstructive pulmonary disease (COPD) (HCC)    Diastolic heart failure (HCC)    History of pulmonary embolism    Atrial fibrillation with controlled ventricular response (HCC)    Vocal cord polyp 12/18/2015   Laryngopharyngeal reflux 12/18/2015   Morbid obesity ([place])    Peripheral vascular disease ([place])    Benign essential HTN    Primary osteoarthritis of both knees 09/16/2014    Conditions to be addressed/monitored: CHF, CAD, HTN, and COPD  Care Plan : CCM RN- COPD (Adult)  Updates made by Johnney Killian, RN since 11/15/2020 12:00 AM     Problem: Symptom Exacerbation (COPD)      Goal: Symptom Exacerbation Prevented  or Minimized   Start Date: 06/24/2019  Expected End Date: 12/24/2020  Recent Progress: On track  Priority: High  Note:   CARE PLAN ENTRY (see longitudinal plan of care for additional care plan information)  Current Barriers:  Chronic Disease Management support, education, and care coordination needs related to Atrial Fibrillation, CHF, HTN, and COPD- Met with patient today onsite during clinic visit.  Patient wearing her O2 and seems to be very SOB when speaking.  Patient noted that she is currently receiving PT and they have request patient get a walker and a bariatric commode.  PT was suppose to get in touch with MD and request order.  Reviewed upcoming appts including appt on 12/07/20 at 9:00 with Pulmonary. Clinical Goal(s) related to Atrial Fibrillation, CHF, HTN, and COPD:  Over the next 30 days, patient will:  Work with the care management team to address educational, disease management, and care coordination needs  Begin or continue self health monitoring activities as directed today Measure and record blood pressure 5-7 times per week and assess swelling in lower extremities daily, check O2 sats when short of breath  Call provider office for new or worsened signs and symptoms Blood pressure findings outside established parameters,  breathing outside of baseline,  Shortness of breath, and New or worsened symptom related to CHF and COPD Call care management team with questions or concerns Verbalize basic understanding of patient centered plan of care established today  Interventions related to Atrial Fibrillation, CHF, HTN, and COPD:  Appropriate assessments completed Reviewed medications with patient and assessed medication taking behavior;  if indicated, discussed importance of medication adherence  Discussed plans with patient for ongoing care management follow up and ensured patient has contact number for CCM team and clinic  Patient Self Care Activities related to Atrial Fibrillation,  CHF, HTN, and COPD:  Patient is unable to independently self-manage chronic health conditions       Plan: Telephone follow up appointment with care management team member scheduled for:  45 days  Johnney Killian, RN, BSN, CCM Care Management Coordinator [facility] Internal Medicine Phone: [phone] / Fax: [phone]

## 2020-11-15 NOTE — Patient Instructions (Signed)
Visit Information   Goals Addressed             This Visit's Progress    Make and Keep All Appointments       Timeframe:  Long-Range Goal Priority:  High Start Date:            05/10/20                 Expected End Date:    ongoing                  Follow Up Date 12/31/20   - arrange a ride through an agency 1 week before appointment - ask family or friend for a ride - call to cancel if needed - keep a calendar with prescription refill dates - keep a calendar with appointment dates - learn the bus route - use public transportation    Why is this important?   Part of staying healthy is seeing the doctor for follow-up care.  If you forget your appointments, there are some things you can do to stay on track.    Notes: 11/15/20 meeting goal     Track and Manage Fluids and Swelling-Heart Failure       Timeframe:  Long-Range Goal Priority:  High Start Date:     06/24/19                        Expected End Date:    ongoing                  Follow Up Date 12/31/20   - keep legs up while sitting - use salt in moderation - watch for swelling in feet, ankles and legs every day    Why is this important?   It is important to check your weight daily and watch how much salt and liquids you have.  It will help you to manage your heart failure.    Notes:      Track and Manage My Blood Pressure-Hypertension       Timeframe:  Long-Range Goal Priority:  High Start Date:      06/24/19                       Expected End Date:      ongoing               Follow Up Date 12/31/20   - check blood pressure weekly    Why is this important?   You won't feel high blood pressure, but it can still hurt your blood vessels.  High blood pressure can cause heart or kidney problems. It can also cause a stroke.  Making lifestyle changes like losing a little weight or eating less salt will help.  Checking your blood pressure at home and at different times of the day can help to control blood pressure.   If the doctor prescribes medicine remember to take it the way the doctor ordered.  Call the office if you cannot afford the medicine or if there are questions about it.     Notes: 5/3/  10/05/20- meeting goal     Track and Manage My Symptoms-COPD       Timeframe:  Long-Range Goal Priority:  High Start Date:      06/24/19                       Expected End  Date:                       Follow Up Date 12/31/20   - eliminate symptom triggers at home - keep follow-up appointments - use an extra pillow to sleep    Why is this important?   Tracking your symptoms and other information about your health helps your doctor plan your care.  Write down the symptoms, the time of day, what you were doing and what medicine you are taking.  You will soon learn how to manage your symptoms.     Notes:         The patient verbalized understanding of instructions, educational materials, and care plan provided today and declined offer to receive copy of patient instructions, educational materials, and care plan.   Telephone follow up appointment with care management team member scheduled for: 12/13/2020'@10'$ :[place], RN, BSN, CCM Care Management Coordinator [facility] Internal Medicine Phone: [phone] / Fax: [phone]

## 2020-11-15 NOTE — Assessment & Plan Note (Addendum)
Patient has difficulty with ambulating due to bilateral knee OA and bilateral hip OA. Per patient her orthopedist states she is not currently a good surgical candidate.  Patient needs DME for bariatric manual wheelchair, bedside commode and walker due to her limited mobility. Our staff is working to get this sorted. Patient instructed to call us back in 1 week if she does not hear back from use.

## 2020-11-15 NOTE — Progress Notes (Signed)
   CC: F/u for LLE venous ulcers, DME equipment   HPI:  Sharon Vasquez is a 70 y.o. F with PMH per below who presents for follow up regarding her chronic venous ulcers of the left lower extremity. Please see assessment and plan under notes tab for further details.    Past Medical History:  Diagnosis Date   A-fib [facility])    Anxiety    Arthritis    "qwhre; joints, back" (04/17/2017)   Benign breast cyst in female, left 01/08/2017   Found by Screening mammogram, evaluated by U/S on 01/08/17 and determined to be a benign simple breast cyst.   Cellulitis of left lower leg 05/30/2017   CHF (congestive heart failure) (HCC)    Chronic low back pain 08/21/2016   Chronic lower back pain    Chronic venous insufficiency    /notes 05/30/2017   COPD (chronic obstructive pulmonary disease) (HCC)    Depression    DVT (deep venous thrombosis) ([place]) 11/16/2016   GERD (gastroesophageal reflux disease)    Headache    "weekly for the last 3 months" (04/17/2017)   Hyperlipidemia    Hypertension    Morbid obesity ([place])    PE (pulmonary embolism)    Pulmonary embolism ([place]) 09/21/2014   Sleep apnea    Review of Systems:  Please see assessment and plan under notes tab for further details.    Physical Exam:  Vitals:   11/15/20 0951  BP: 138/88  Pulse: 95  Temp: 98.1 F (36.7 C)  TempSrc: Oral  SpO2: 97%  Weight: (!) 384 lb 12.8 oz (174.5 kg)  Height: '6\' 2"'$  (1.88 m)   Gen: No acute distress CV: RRR, no murmurs  Pulm: Non labored breathing on 2L [place], no wheezing or crackles  Ext: Firm bilaterally, 2+ pitting edema of BLE, hyperpigmentation of BLE  Neuro: Non focal exam   Assessment & Plan:   See Encounters Tab for problem based charting.  Patient discussed with Dr. Dareen Piano

## 2020-11-15 NOTE — Assessment & Plan Note (Addendum)
Patient reports that she was recently seen by vascular surgery.  She reports that she went to her cardiology visit 7/22 an unna boot was prescribed. This helped and her LLE ulcer is healed. She uses an absorptive bandage to help cushion the area. During her most recent visit to vascular surgery she was noted to have superficial and deep venous reflux on the left side. Her R ABI was 0.8 and toe pressure was 23mHg, L ABI was 0.9 and toe pressure was 695mg. It was discussed with patient the importance of wearing compression stockings or ace wrap daily. She states she has not been able to have her lower extremities wrapped over the last week or so because she has no help at home right now and can not physically do this herself. She states that she will get one of her daughters to help her. She will also continue her torsemide and continue to watch her salt intake.  -Patient will continue ace wraps and leg elevation -Per vascular patient is not a candidate for laser therapy for her venous disease given her obesity.

## 2020-11-17 DIAGNOSIS — J449 Chronic obstructive pulmonary disease, unspecified: Secondary | ICD-10-CM | POA: Diagnosis not present

## 2020-11-17 NOTE — Progress Notes (Signed)
Internal Medicine Clinic Attending  Case discussed with Dr. Carter  At the time of the visit.  We reviewed the resident's history and exam and pertinent patient test results.  I agree with the assessment, diagnosis, and plan of care documented in the resident's note.  

## 2020-11-18 DIAGNOSIS — Z86718 Personal history of other venous thrombosis and embolism: Secondary | ICD-10-CM | POA: Diagnosis not present

## 2020-11-18 DIAGNOSIS — M170 Bilateral primary osteoarthritis of knee: Secondary | ICD-10-CM | POA: Diagnosis not present

## 2020-11-18 DIAGNOSIS — J9611 Chronic respiratory failure with hypoxia: Secondary | ICD-10-CM | POA: Diagnosis not present

## 2020-11-18 DIAGNOSIS — I739 Peripheral vascular disease, unspecified: Secondary | ICD-10-CM | POA: Diagnosis not present

## 2020-11-18 DIAGNOSIS — Z87891 Personal history of nicotine dependence: Secondary | ICD-10-CM | POA: Diagnosis not present

## 2020-11-18 DIAGNOSIS — E785 Hyperlipidemia, unspecified: Secondary | ICD-10-CM | POA: Diagnosis not present

## 2020-11-18 DIAGNOSIS — L97822 Non-pressure chronic ulcer of other part of left lower leg with fat layer exposed: Secondary | ICD-10-CM | POA: Diagnosis not present

## 2020-11-18 DIAGNOSIS — I4891 Unspecified atrial fibrillation: Secondary | ICD-10-CM | POA: Diagnosis not present

## 2020-11-18 DIAGNOSIS — G4730 Sleep apnea, unspecified: Secondary | ICD-10-CM | POA: Diagnosis not present

## 2020-11-18 DIAGNOSIS — I110 Hypertensive heart disease with heart failure: Secondary | ICD-10-CM | POA: Diagnosis not present

## 2020-11-18 DIAGNOSIS — G8929 Other chronic pain: Secondary | ICD-10-CM | POA: Diagnosis not present

## 2020-11-18 DIAGNOSIS — Z9981 Dependence on supplemental oxygen: Secondary | ICD-10-CM | POA: Diagnosis not present

## 2020-11-18 DIAGNOSIS — J449 Chronic obstructive pulmonary disease, unspecified: Secondary | ICD-10-CM | POA: Diagnosis not present

## 2020-11-18 DIAGNOSIS — I5032 Chronic diastolic (congestive) heart failure: Secondary | ICD-10-CM | POA: Diagnosis not present

## 2020-11-18 DIAGNOSIS — G4733 Obstructive sleep apnea (adult) (pediatric): Secondary | ICD-10-CM | POA: Diagnosis not present

## 2020-11-18 DIAGNOSIS — M810 Age-related osteoporosis without current pathological fracture: Secondary | ICD-10-CM | POA: Diagnosis not present

## 2020-11-18 DIAGNOSIS — Z86711 Personal history of pulmonary embolism: Secondary | ICD-10-CM | POA: Diagnosis not present

## 2020-11-18 DIAGNOSIS — K219 Gastro-esophageal reflux disease without esophagitis: Secondary | ICD-10-CM | POA: Diagnosis not present

## 2020-11-18 DIAGNOSIS — M545 Low back pain, unspecified: Secondary | ICD-10-CM | POA: Diagnosis not present

## 2020-11-18 DIAGNOSIS — I872 Venous insufficiency (chronic) (peripheral): Secondary | ICD-10-CM | POA: Diagnosis not present

## 2020-11-18 NOTE — Addendum Note (Signed)
Addended by: Althea Grimmer on: 11/18/2020 01:33 PM   Modules accepted: Orders

## 2020-11-25 ENCOUNTER — Telehealth: Payer: Self-pay | Admitting: *Deleted

## 2020-11-25 DIAGNOSIS — M810 Age-related osteoporosis without current pathological fracture: Secondary | ICD-10-CM | POA: Diagnosis not present

## 2020-11-25 DIAGNOSIS — G4730 Sleep apnea, unspecified: Secondary | ICD-10-CM | POA: Diagnosis not present

## 2020-11-25 DIAGNOSIS — I110 Hypertensive heart disease with heart failure: Secondary | ICD-10-CM | POA: Diagnosis not present

## 2020-11-25 DIAGNOSIS — Z9981 Dependence on supplemental oxygen: Secondary | ICD-10-CM | POA: Diagnosis not present

## 2020-11-25 DIAGNOSIS — I739 Peripheral vascular disease, unspecified: Secondary | ICD-10-CM | POA: Diagnosis not present

## 2020-11-25 DIAGNOSIS — Z86718 Personal history of other venous thrombosis and embolism: Secondary | ICD-10-CM | POA: Diagnosis not present

## 2020-11-25 DIAGNOSIS — M545 Low back pain, unspecified: Secondary | ICD-10-CM | POA: Diagnosis not present

## 2020-11-25 DIAGNOSIS — J449 Chronic obstructive pulmonary disease, unspecified: Secondary | ICD-10-CM | POA: Diagnosis not present

## 2020-11-25 DIAGNOSIS — G4733 Obstructive sleep apnea (adult) (pediatric): Secondary | ICD-10-CM | POA: Diagnosis not present

## 2020-11-25 DIAGNOSIS — I872 Venous insufficiency (chronic) (peripheral): Secondary | ICD-10-CM | POA: Diagnosis not present

## 2020-11-25 DIAGNOSIS — I4891 Unspecified atrial fibrillation: Secondary | ICD-10-CM | POA: Diagnosis not present

## 2020-11-25 DIAGNOSIS — I5032 Chronic diastolic (congestive) heart failure: Secondary | ICD-10-CM | POA: Diagnosis not present

## 2020-11-25 DIAGNOSIS — Z87891 Personal history of nicotine dependence: Secondary | ICD-10-CM | POA: Diagnosis not present

## 2020-11-25 DIAGNOSIS — K219 Gastro-esophageal reflux disease without esophagitis: Secondary | ICD-10-CM | POA: Diagnosis not present

## 2020-11-25 DIAGNOSIS — E785 Hyperlipidemia, unspecified: Secondary | ICD-10-CM | POA: Diagnosis not present

## 2020-11-25 DIAGNOSIS — J9611 Chronic respiratory failure with hypoxia: Secondary | ICD-10-CM | POA: Diagnosis not present

## 2020-11-25 DIAGNOSIS — Z86711 Personal history of pulmonary embolism: Secondary | ICD-10-CM | POA: Diagnosis not present

## 2020-11-25 DIAGNOSIS — G8929 Other chronic pain: Secondary | ICD-10-CM | POA: Diagnosis not present

## 2020-11-25 DIAGNOSIS — M170 Bilateral primary osteoarthritis of knee: Secondary | ICD-10-CM | POA: Diagnosis not present

## 2020-11-25 DIAGNOSIS — L97822 Non-pressure chronic ulcer of other part of left lower leg with fat layer exposed: Secondary | ICD-10-CM | POA: Diagnosis not present

## 2020-11-25 NOTE — Telephone Encounter (Signed)
Hoyle Sauer, PT with [facility] called requesting bariatric rollator, BSC, and manual w/c.   Dr. Eulas Post,  Please place order for bariatric manual w/c and include need for this in your note from 8/15. Please change order for walker to one with seat and breaks.  Please add HT 6'2" and WT 384 lbs to all 3 orders.  Thank you!

## 2020-11-30 NOTE — Addendum Note (Signed)
Addended by: Althea Grimmer on: 11/30/2020 11:10 AM   Modules accepted: Orders

## 2020-11-30 NOTE — Addendum Note (Signed)
Addended by: Althea Grimmer on: 11/30/2020 11:19 AM   Modules accepted: Orders

## 2020-12-01 DIAGNOSIS — Z87891 Personal history of nicotine dependence: Secondary | ICD-10-CM | POA: Diagnosis not present

## 2020-12-01 DIAGNOSIS — Z86718 Personal history of other venous thrombosis and embolism: Secondary | ICD-10-CM | POA: Diagnosis not present

## 2020-12-01 DIAGNOSIS — J449 Chronic obstructive pulmonary disease, unspecified: Secondary | ICD-10-CM | POA: Diagnosis not present

## 2020-12-01 DIAGNOSIS — L97822 Non-pressure chronic ulcer of other part of left lower leg with fat layer exposed: Secondary | ICD-10-CM | POA: Diagnosis not present

## 2020-12-01 DIAGNOSIS — G4730 Sleep apnea, unspecified: Secondary | ICD-10-CM | POA: Diagnosis not present

## 2020-12-01 DIAGNOSIS — I739 Peripheral vascular disease, unspecified: Secondary | ICD-10-CM | POA: Diagnosis not present

## 2020-12-01 DIAGNOSIS — Z9981 Dependence on supplemental oxygen: Secondary | ICD-10-CM | POA: Diagnosis not present

## 2020-12-01 DIAGNOSIS — I872 Venous insufficiency (chronic) (peripheral): Secondary | ICD-10-CM | POA: Diagnosis not present

## 2020-12-01 DIAGNOSIS — G4733 Obstructive sleep apnea (adult) (pediatric): Secondary | ICD-10-CM | POA: Diagnosis not present

## 2020-12-01 DIAGNOSIS — E785 Hyperlipidemia, unspecified: Secondary | ICD-10-CM | POA: Diagnosis not present

## 2020-12-01 DIAGNOSIS — M170 Bilateral primary osteoarthritis of knee: Secondary | ICD-10-CM | POA: Diagnosis not present

## 2020-12-01 DIAGNOSIS — M810 Age-related osteoporosis without current pathological fracture: Secondary | ICD-10-CM | POA: Diagnosis not present

## 2020-12-01 DIAGNOSIS — M545 Low back pain, unspecified: Secondary | ICD-10-CM | POA: Diagnosis not present

## 2020-12-01 DIAGNOSIS — G8929 Other chronic pain: Secondary | ICD-10-CM | POA: Diagnosis not present

## 2020-12-01 DIAGNOSIS — J9611 Chronic respiratory failure with hypoxia: Secondary | ICD-10-CM | POA: Diagnosis not present

## 2020-12-01 DIAGNOSIS — Z86711 Personal history of pulmonary embolism: Secondary | ICD-10-CM | POA: Diagnosis not present

## 2020-12-01 DIAGNOSIS — K219 Gastro-esophageal reflux disease without esophagitis: Secondary | ICD-10-CM | POA: Diagnosis not present

## 2020-12-01 DIAGNOSIS — I5032 Chronic diastolic (congestive) heart failure: Secondary | ICD-10-CM | POA: Diagnosis not present

## 2020-12-01 DIAGNOSIS — I110 Hypertensive heart disease with heart failure: Secondary | ICD-10-CM | POA: Diagnosis not present

## 2020-12-01 DIAGNOSIS — I4891 Unspecified atrial fibrillation: Secondary | ICD-10-CM | POA: Diagnosis not present

## 2020-12-01 NOTE — Telephone Encounter (Signed)
Pt is calling again about her orders she stated that therapist is asking how long will it take for the orders to be signed  it has been awhile since the request was sent to [place]

## 2020-12-02 NOTE — Telephone Encounter (Signed)
CM sent to Sharon Vasquez at [facility] for Bariatric [facility], Rollator, and Manual w/c. F2F was 11/15/20.

## 2020-12-03 NOTE — Telephone Encounter (Signed)
Jones, Danielle  Tabb Croghan L, RN; Jones, Danielle; Stenson, Melissa; Garcia, Patricia; New, Bradley; 1 other  Received! Thank you!  

## 2020-12-07 ENCOUNTER — Ambulatory Visit: Payer: Medicare Other | Admitting: Primary Care

## 2020-12-07 DIAGNOSIS — F17210 Nicotine dependence, cigarettes, uncomplicated: Secondary | ICD-10-CM | POA: Diagnosis not present

## 2020-12-07 DIAGNOSIS — M170 Bilateral primary osteoarthritis of knee: Secondary | ICD-10-CM | POA: Diagnosis not present

## 2020-12-07 DIAGNOSIS — Z79899 Other long term (current) drug therapy: Secondary | ICD-10-CM | POA: Diagnosis not present

## 2020-12-07 DIAGNOSIS — G4733 Obstructive sleep apnea (adult) (pediatric): Secondary | ICD-10-CM | POA: Diagnosis not present

## 2020-12-07 DIAGNOSIS — Z0000 Encounter for general adult medical examination without abnormal findings: Secondary | ICD-10-CM | POA: Diagnosis not present

## 2020-12-08 ENCOUNTER — Encounter: Payer: Self-pay | Admitting: Primary Care

## 2020-12-08 ENCOUNTER — Other Ambulatory Visit: Payer: Self-pay

## 2020-12-08 ENCOUNTER — Ambulatory Visit (INDEPENDENT_AMBULATORY_CARE_PROVIDER_SITE_OTHER): Payer: Medicare Other | Admitting: Primary Care

## 2020-12-08 DIAGNOSIS — G4733 Obstructive sleep apnea (adult) (pediatric): Secondary | ICD-10-CM | POA: Diagnosis not present

## 2020-12-08 DIAGNOSIS — F17210 Nicotine dependence, cigarettes, uncomplicated: Secondary | ICD-10-CM

## 2020-12-08 DIAGNOSIS — J849 Interstitial pulmonary disease, unspecified: Secondary | ICD-10-CM | POA: Diagnosis not present

## 2020-12-08 DIAGNOSIS — J441 Chronic obstructive pulmonary disease with (acute) exacerbation: Secondary | ICD-10-CM

## 2020-12-08 DIAGNOSIS — J9611 Chronic respiratory failure with hypoxia: Secondary | ICD-10-CM | POA: Diagnosis not present

## 2020-12-08 DIAGNOSIS — Z720 Tobacco use: Secondary | ICD-10-CM

## 2020-12-08 MED ORDER — PREDNISONE 10 MG PO TABS: ORAL_TABLET | ORAL | 0 refills | Status: DC

## 2020-12-08 NOTE — Patient Instructions (Addendum)
Recommendations: - Stay on Torsemide '40mg'$  daily  - Continue Advair 250 and Spiriva  - Take mucinex '600mg'$  twice a day as needed for congestion - Prednisone taper as prescribed  - Need to work on CPAP compliance, aim to wear EVERYNIGHT (call [employer] to get oxygen adapter for CPAP) - Continue to work on smoking cessation  Follow-up: - 3 months with Dr. Elsworth Soho

## 2020-12-08 NOTE — Progress Notes (Signed)
$'@Patient'q$  ID: Sharon Vasquez, female    DOB: [birth year], 70 y.o.   [name]: [id number]  Chief Complaint  Patient presents with   Follow-up    Patient reports she is here for a follow up for sleep apnea.     Referring provider: Andrew Au, MD  HPI: 70 year old female, current some day smoker.  Past medical history significant for COPD, RB-ILD, chronic respiratory failure with hypoxia, OSA, hypertension, A. fib, chronic anticoagulation, diastolic heart failure, aortic arthrosclerosis GERD, osteoporosis, chronic pain syndrome, obesity. [name].  Maintained on CPAP.  Amiodarone discontinued.  Smoking cessation encouraged.  Previous [facility] pulmonary encounter: Hospital course 04/10/2020-04/15/2020 Patient was admitted for acute hypoxic respiratory failure due to COPD exacerbation and respiratory bronchiolitis associated ILD.  Oxygen saturation was 70% upon EMS arrival.  She was started on BiPAP in ED.  She had severe diffuse wheezing.  She was treated for acute COPD exacerbation with 5 days of azithromycin and steroids.  She was transitioned to nasal cannula 12 L which was gradually weaned down.  Discharged on home oxygen.  05/13/2020 Patient presents today for hospital follow-up.  Patient was admitted from 04/10/2020-04/15/2020 at [facility] for COPD exacerbation. She still gets out of breath with minimal exertion. Associated wheezing. She is complaint with Advair 250/50 and Spiriva handihaler. She has not been set up with home oxygen, needs concentrator.  Using CPAP fairly regularly. Has full face mask. States that she has not smoked in 1 week but still taking "puffs" of cigarettes. She had virtual visit with cardiology yesterday, no changes. She is off amiodarone. She is taking Torsemide '20mg'$  twice a day. Current weight 384lbs   Airview download 04/12/20-05/11/20: 25/30 days (83%); 21 days (70%) > 4 hours. Average usage days used 8 hours 31 mins. Pressure 13 cm h20. Airleaks 11.0 median  (102.4L/min). AHI 10.8   06/18/2020 - NP Patient contacted today for acute televisit. History significant for COPD, RB-ILD and OSA. Reports increased shortness of breath x 1 week. Associated nasal congestion and sneezing. No significant cough. She reports compliance with Spiriva handihaler. Using 2-3L oxygen 24/7. She is on Xarelto for afib. She ran out of Advair several days ago, needs refill. She has not been compliant with her CPAP. Wearing her oxygen at night only. We will place an order for DME company to help her sort this out.   08/03/20- Dr. Elsworth Soho  71 yo morbidly obese smoker for follow-up of ILD and OSA She smoked up to 2 packs/day,  now decreased to about 4-5 cigarettes daily   PMH - chronic diastolic heart failure.  She has a history of pulmonary embolism/ LLE DVT 2015  on Xarelto  s/p venoplasty and stenting of left common iliac and external iliac vein Duplex 05/2017 did not show any residual stenosis. -on Xarelto for chronic atrial fibrillation , amiodarone stopped 10/2019  06/2020 tele visit - advair resumed  She complains of increased shortness of breath, NYHA class III including routine activities around the house.  She had an ED visit 4/23 where she was noted to be wheezing bilateral BNP 128 troponin negative, chest x-ray suggested interstitial prominence, given Lasix and prednisone taper. She is mildly improved She arrives in a motorized wheelchair today, trying to stay active.  She continues to smoke a pack lasts her a few weeks   12/08/2020- Interim  Patient presents today for 4 month follow-up. History significant for COPD, RB-ILD and OSA. Her breathing is alright. She is maintained on Advair 250  and Spiriva handihaler. She rarely requires her albuterol rescue inhaler, on averge once every 2 weeks. She wears 2L oxygen when doing ADLs at home and at night. She is not consistently using her CPAP d/t equipment issues. She needs oxygen connection for her CPAP. DME company is [employer]. She  is taking '40mg'$  Torsemide daily. She has chronic LE edema. Her weight is down, reports she weighted 364lbs yesterday at pain doctor. She is in a motorized wheelchair today. She is receiving physical therapy at home. She got a new walker yesterday. She continues to work on cutting back her smoking. She tells me that 3 cigarettes will last her all day, this is down from 2 packs.   Airview download 11/07/20-12/06/20 Usage 9/30 days (30%); 7 days (23%) > 4 hours Average usage days used 5 hours 46 mins Pressure 13cm h20 Airleaks 33.5L/min (95%) AHI 4.7   No Known Allergies  Immunization History  Administered Date(s) Administered   Moderna Sars-Covid-2 Vaccination 08/20/2019, 09/11/2019   PFIZER(Purple Top)SARS-COV-2 Vaccination 03/17/2020   Pneumococcal Conjugate-13 02/01/2017   Pneumococcal Polysaccharide-23 02/12/2020   Tdap 06/05/2019    Past Medical History:  Diagnosis Date   A-fib [facility])    Anxiety    Arthritis    "qwhre; joints, back" (04/17/2017)   Benign breast cyst in female, left 01/08/2017   Found by Screening mammogram, evaluated by U/S on 01/08/17 and determined to be a benign simple breast cyst.   Cellulitis of left lower leg 05/30/2017   CHF (congestive heart failure) (HCC)    Chronic low back pain 08/21/2016   Chronic lower back pain    Chronic venous insufficiency    /notes 05/30/2017   COPD (chronic obstructive pulmonary disease) (HCC)    Depression    DVT (deep venous thrombosis) ([place]) 11/16/2016   GERD (gastroesophageal reflux disease)    Headache    "weekly for the last 3 months" (04/17/2017)   Hyperlipidemia    Hypertension    Morbid obesity ([place])    PE (pulmonary embolism)    Pulmonary embolism ([place]) 09/21/2014   Sleep apnea     Tobacco History: Social History   Tobacco Use  Smoking Status Former   Packs/day: 2.00   Years: 55.00   Pack years: 110.00   Types: Cigarettes   Start date: 08/16/1961   Quit date: 05/03/2020   Years since quitting: 0.6  Smokeless  Tobacco Never   Counseling given: Not Answered   Outpatient Medications Prior to Visit  Medication Sig Dispense Refill   albuterol (VENTOLIN HFA) 108 (90 Base) MCG/ACT inhaler Inhale into the lungs.     aspirin EC 81 MG tablet Take 81 mg by mouth daily.     atorvastatin (LIPITOR) 80 MG tablet TAKE 1 TABLET(80 MG) BY MOUTH DAILY 90 tablet 0   esomeprazole (NEXIUM) 40 MG capsule Take 1 capsule (40 mg total) by mouth daily as needed (for heartburn or indigestion). (Patient taking differently: Take 40 mg by mouth daily.) 90 capsule 1   Fluticasone-Salmeterol (ADVAIR) 250-50 MCG/DOSE AEPB Inhale 1 puff into the lungs 2 (two) times daily. 60 each 11   gabapentin (NEURONTIN) 400 MG capsule TAKE 1 CAPSULE BY MOUTH  TWICE DAILY (Patient taking differently: Take 400 mg by mouth 2 (two) times daily.) 180 capsule 3   ipratropium-albuterol (DUONEB) 0.5-2.5 (3) MG/3ML SOLN Take 3 mLs by nebulization every 6 (six) hours as needed. (Patient taking differently: Take 3 mLs by nebulization every 6 (six) hours as needed (shortness of breath/wheezing).) 360 mL 3  isosorbide mononitrate (IMDUR) 30 MG 24 hr tablet TAKE 1 TABLET(30 MG) BY MOUTH DAILY 90 tablet 0   metoprolol succinate (TOPROL XL) 25 MG 24 hr tablet Take 1 tablet (25 mg total) by mouth daily. 90 tablet 3   mirabegron ER (MYRBETRIQ) 50 MG TB24 tablet Take 1 tablet (50 mg total) by mouth daily. 90 tablet 1   oxyCODONE (ROXICODONE) 15 MG immediate release tablet Take 15 mg by mouth 4 (four) times daily as needed for pain.     OXYGEN Inhale 2 L into the lungs as needed (shortness of breath).     potassium chloride (KLOR-CON) 10 MEQ tablet Take 1 tablet (10 mEq total) by mouth 2 (two) times daily. 180 tablet 1   PRESCRIPTION MEDICATION Inhale into the lungs at bedtime. cpap     rivaroxaban (XARELTO) 20 MG TABS tablet Take 1 tablet (20 mg total) by mouth every morning. 90 tablet 3   tiotropium (SPIRIVA HANDIHALER) 18 MCG inhalation capsule PLACE 1 CAPSULE  INTO INHALER AND INHALE DAILY (Patient taking differently: Place 18 mcg into inhaler and inhale daily.) 90 capsule 1   torsemide (DEMADEX) 20 MG tablet Take 2 tablets by mouth daily, you may take an extra 1/2 tablet only as needed for weight gain of 2 lbs overnight or 5 lbs in a week 225 tablet 1   No facility-administered medications prior to visit.   Review of Systems  Review of Systems  Constitutional: Negative.   HENT: Negative.    Respiratory:  Positive for cough and wheezing. Negative for chest tightness and shortness of breath.   Cardiovascular: Negative.     Physical Exam  BP 130/80 (BP Location: Left Arm, Patient Position: Sitting, Cuff Size: Large)   Pulse 74   Temp 98.2 F (36.8 C) (Oral)   Ht '6\' 2"'$  (1.88 m)   SpO2 96%   BMI 49.41 kg/m  Physical Exam Constitutional:      Appearance: Normal appearance.  HENT:     Head: Normocephalic and atraumatic.     Mouth/Throat:     Comments: Deferred d/t masking  Cardiovascular:     Rate and Rhythm: Normal rate and regular rhythm.  Pulmonary:     Effort: Pulmonary effort is normal.     Breath sounds: Wheezing present.     Comments: Chronic BLE edema +1-2 Musculoskeletal:     Comments: Motorized WC   Skin:    General: Skin is warm and dry.  Neurological:     General: No focal deficit present.     Mental Status: She is alert and oriented to person, place, and time. Mental status is at baseline.  Psychiatric:        Mood and Affect: Mood normal.        Behavior: Behavior normal.        Thought Content: Thought content normal.        Judgment: Judgment normal.     Lab Results:  CBC    Component Value Date/Time   WBC 6.0 10/07/2020 2044   RBC 4.57 10/07/2020 2044   HGB 13.0 10/07/2020 2044   HGB 12.4 04/29/2018 1235   HCT 39.6 10/07/2020 2044   HCT 37.2 04/29/2018 1235   PLT 364 10/07/2020 2044   PLT 389 04/29/2018 1235   MCV 86.7 10/07/2020 2044   MCV 86 04/29/2018 1235   MCH 28.4 10/07/2020 2044   MCHC  32.8 10/07/2020 2044   RDW 14.2 10/07/2020 2044   RDW 13.4 04/29/2018 1235   LYMPHSABS  2.4 10/07/2020 2044   MONOABS 0.3 10/07/2020 2044   EOSABS 0.1 10/07/2020 2044   BASOSABS 0.0 10/07/2020 2044    BMET    Component Value Date/Time   NA 140 10/08/2020 0600   NA 144 10/23/2019 0924   K 3.3 (L) 10/08/2020 0600   CL 106 10/08/2020 0600   CO2 25 10/08/2020 0600   GLUCOSE 119 (H) 10/08/2020 0600   BUN 12 10/08/2020 0600   BUN 16 10/23/2019 0924   CREATININE 0.98 10/08/2020 0600   CREATININE 1.21 (H) 07/26/2016 1528   CALCIUM 8.7 (L) 10/08/2020 0600   GFRNONAA >60 10/08/2020 0600   GFRAA 79 10/23/2019 0924    BNP    Component Value Date/Time   BNP 75.0 10/07/2020 2044    ProBNP No results found for: PROBNP  Imaging: No results found.   Assessment & Plan:   OSA (obstructive sleep apnea) - Continues to struggle with compliance, wearing on average 23% > 4 hours. She is having issues getting a part for her CPAP and is working with [employer] to resolve this.  - Pressure 13cm h20, residual AHI 4.7 - No changes to settings today, continue to encourage compliance - FU in 3 months with Dr. Elsworth Soho   Chronic respiratory failure with hypoxia ([place]) - Unable to walk in office today d/t deconditioning and obesity. She is in a motorized wheelchair. Working with home physical therapy. Continue 2L oxygen with ADLs and at night   Acute exacerbation of chronic obstructive pulmonary disease (COPD) ([place]) - Patient had wheezing on exam. Advised she take mucinex '600mg'$  twice daily and sending in RX for prednisone taper. Continue Advair 250 + Spiriva handihaler. May consider trial Trelegy 100 or Breztri at follow-up if wheezing continues.   Tobacco use - She has cut back from 2 packs per day to 2-3 cigarette a day  - Recommend she pick a quit date   ILD (interstitial lung disease) ([place]) - Last HRCT showed no significant interval change of fibrosis. She is not a candidate for biopsy,  anti-fibrotics are not warranted at this time. Continue to encourage complete smoking cessation.   40 mins spent on case, > 50% face to face with patient   Martyn Ehrich, NP 12/08/2020

## 2020-12-08 NOTE — Assessment & Plan Note (Signed)
-   She has cut back from 2 packs per day to 2-3 cigarette a day  - Recommend she pick a quit date

## 2020-12-08 NOTE — Assessment & Plan Note (Signed)
-   Unable to walk in office today d/t deconditioning and obesity. She is in a motorized wheelchair. Working with home physical therapy. Continue 2L oxygen with ADLs and at night

## 2020-12-08 NOTE — Assessment & Plan Note (Signed)
-   Last HRCT showed no significant interval change of fibrosis. She is not a candidate for biopsy, anti-fibrotics are not warranted at this time. Continue to encourage complete smoking cessation.

## 2020-12-08 NOTE — Assessment & Plan Note (Addendum)
-   Patient had wheezing on exam. Advised she take mucinex '600mg'$  twice daily and sending in RX for prednisone taper. Continue Advair 250 + Spiriva handihaler. May consider trial Trelegy 100 or Breztri at follow-up if wheezing continues.

## 2020-12-08 NOTE — Assessment & Plan Note (Signed)
-   Continues to struggle with compliance, wearing on average 23% > 4 hours. She is having issues getting a part for her CPAP and is working with [employer] to resolve this.  - Pressure 13cm h20, residual AHI 4.7 - No changes to settings today, continue to encourage compliance - FU in 3 months with Dr. Elsworth Soho

## 2020-12-09 DIAGNOSIS — J9611 Chronic respiratory failure with hypoxia: Secondary | ICD-10-CM | POA: Diagnosis not present

## 2020-12-09 DIAGNOSIS — I872 Venous insufficiency (chronic) (peripheral): Secondary | ICD-10-CM | POA: Diagnosis not present

## 2020-12-09 DIAGNOSIS — I110 Hypertensive heart disease with heart failure: Secondary | ICD-10-CM | POA: Diagnosis not present

## 2020-12-09 DIAGNOSIS — Z86718 Personal history of other venous thrombosis and embolism: Secondary | ICD-10-CM | POA: Diagnosis not present

## 2020-12-09 DIAGNOSIS — M170 Bilateral primary osteoarthritis of knee: Secondary | ICD-10-CM | POA: Diagnosis not present

## 2020-12-09 DIAGNOSIS — I4891 Unspecified atrial fibrillation: Secondary | ICD-10-CM | POA: Diagnosis not present

## 2020-12-09 DIAGNOSIS — I739 Peripheral vascular disease, unspecified: Secondary | ICD-10-CM | POA: Diagnosis not present

## 2020-12-09 DIAGNOSIS — G4733 Obstructive sleep apnea (adult) (pediatric): Secondary | ICD-10-CM | POA: Diagnosis not present

## 2020-12-09 DIAGNOSIS — I5032 Chronic diastolic (congestive) heart failure: Secondary | ICD-10-CM | POA: Diagnosis not present

## 2020-12-09 DIAGNOSIS — Z86711 Personal history of pulmonary embolism: Secondary | ICD-10-CM | POA: Diagnosis not present

## 2020-12-09 DIAGNOSIS — K219 Gastro-esophageal reflux disease without esophagitis: Secondary | ICD-10-CM | POA: Diagnosis not present

## 2020-12-09 DIAGNOSIS — G8929 Other chronic pain: Secondary | ICD-10-CM | POA: Diagnosis not present

## 2020-12-09 DIAGNOSIS — L97822 Non-pressure chronic ulcer of other part of left lower leg with fat layer exposed: Secondary | ICD-10-CM | POA: Diagnosis not present

## 2020-12-09 DIAGNOSIS — M810 Age-related osteoporosis without current pathological fracture: Secondary | ICD-10-CM | POA: Diagnosis not present

## 2020-12-09 DIAGNOSIS — E785 Hyperlipidemia, unspecified: Secondary | ICD-10-CM | POA: Diagnosis not present

## 2020-12-09 DIAGNOSIS — Z9981 Dependence on supplemental oxygen: Secondary | ICD-10-CM | POA: Diagnosis not present

## 2020-12-09 DIAGNOSIS — J449 Chronic obstructive pulmonary disease, unspecified: Secondary | ICD-10-CM | POA: Diagnosis not present

## 2020-12-09 DIAGNOSIS — Z87891 Personal history of nicotine dependence: Secondary | ICD-10-CM | POA: Diagnosis not present

## 2020-12-09 DIAGNOSIS — Z7901 Long term (current) use of anticoagulants: Secondary | ICD-10-CM | POA: Diagnosis not present

## 2020-12-13 ENCOUNTER — Telehealth: Payer: Self-pay | Admitting: *Deleted

## 2020-12-13 ENCOUNTER — Telehealth: Payer: Medicare Other

## 2020-12-13 NOTE — Chronic Care Management (AMB) (Signed)
  Care Management   Note  12/13/2020 Name: [name] [name]: [id number] DOB: 24-Sep-1950  Sharon Vasquez is a 70 y.o. year old female who is a primary care patient of [name], DO and is actively engaged with the care management team. I reached out to [name] by phone today to assist with re-scheduling a follow up visit with the RN Case Manager  Follow up plan: Unsuccessful telephone outreach attempt made. A HIPAA compliant phone message was left for the patient providing contact information and requesting a return call.  The care management team will reach out to the patient again over the next 7 days.  If patient returns call to provider office, please advise to call [place] at [phone].  [facility]  Direct Dial: [phone]

## 2020-12-15 ENCOUNTER — Telehealth: Payer: Self-pay | Admitting: *Deleted

## 2020-12-15 DIAGNOSIS — G8929 Other chronic pain: Secondary | ICD-10-CM | POA: Diagnosis not present

## 2020-12-15 DIAGNOSIS — M810 Age-related osteoporosis without current pathological fracture: Secondary | ICD-10-CM | POA: Diagnosis not present

## 2020-12-15 DIAGNOSIS — G4733 Obstructive sleep apnea (adult) (pediatric): Secondary | ICD-10-CM | POA: Diagnosis not present

## 2020-12-15 DIAGNOSIS — I872 Venous insufficiency (chronic) (peripheral): Secondary | ICD-10-CM | POA: Diagnosis not present

## 2020-12-15 DIAGNOSIS — J9611 Chronic respiratory failure with hypoxia: Secondary | ICD-10-CM | POA: Diagnosis not present

## 2020-12-15 DIAGNOSIS — Z87891 Personal history of nicotine dependence: Secondary | ICD-10-CM | POA: Diagnosis not present

## 2020-12-15 DIAGNOSIS — Z7901 Long term (current) use of anticoagulants: Secondary | ICD-10-CM | POA: Diagnosis not present

## 2020-12-15 DIAGNOSIS — I110 Hypertensive heart disease with heart failure: Secondary | ICD-10-CM | POA: Diagnosis not present

## 2020-12-15 DIAGNOSIS — I4891 Unspecified atrial fibrillation: Secondary | ICD-10-CM | POA: Diagnosis not present

## 2020-12-15 DIAGNOSIS — I739 Peripheral vascular disease, unspecified: Secondary | ICD-10-CM | POA: Diagnosis not present

## 2020-12-15 DIAGNOSIS — K219 Gastro-esophageal reflux disease without esophagitis: Secondary | ICD-10-CM | POA: Diagnosis not present

## 2020-12-15 DIAGNOSIS — E785 Hyperlipidemia, unspecified: Secondary | ICD-10-CM | POA: Diagnosis not present

## 2020-12-15 DIAGNOSIS — Z9981 Dependence on supplemental oxygen: Secondary | ICD-10-CM | POA: Diagnosis not present

## 2020-12-15 DIAGNOSIS — I5032 Chronic diastolic (congestive) heart failure: Secondary | ICD-10-CM | POA: Diagnosis not present

## 2020-12-15 DIAGNOSIS — L97822 Non-pressure chronic ulcer of other part of left lower leg with fat layer exposed: Secondary | ICD-10-CM | POA: Diagnosis not present

## 2020-12-15 DIAGNOSIS — Z86718 Personal history of other venous thrombosis and embolism: Secondary | ICD-10-CM | POA: Diagnosis not present

## 2020-12-15 DIAGNOSIS — Z86711 Personal history of pulmonary embolism: Secondary | ICD-10-CM | POA: Diagnosis not present

## 2020-12-15 DIAGNOSIS — J449 Chronic obstructive pulmonary disease, unspecified: Secondary | ICD-10-CM | POA: Diagnosis not present

## 2020-12-15 DIAGNOSIS — M170 Bilateral primary osteoarthritis of knee: Secondary | ICD-10-CM | POA: Diagnosis not present

## 2020-12-15 NOTE — Telephone Encounter (Signed)
Mickel Baas, PT with [facility] called in requesting VO to continue [facility] PT 1 week 4, and every other week x 4 weeks to work on balance, gait, and endurance. Also, requesting [place] for spot on left shin that has opened back up. Verbal auth given. Will route to PCP for agreement/denial.  Confirmed with Mickel Baas that patient received bariatric rollator, BSC, and manual w/c.

## 2020-12-15 NOTE — Telephone Encounter (Signed)
I agree with [facility] nursing evaluation. Thank you!

## 2020-12-17 DIAGNOSIS — M170 Bilateral primary osteoarthritis of knee: Secondary | ICD-10-CM | POA: Diagnosis not present

## 2020-12-17 DIAGNOSIS — Z86718 Personal history of other venous thrombosis and embolism: Secondary | ICD-10-CM | POA: Diagnosis not present

## 2020-12-17 DIAGNOSIS — M810 Age-related osteoporosis without current pathological fracture: Secondary | ICD-10-CM | POA: Diagnosis not present

## 2020-12-17 DIAGNOSIS — J449 Chronic obstructive pulmonary disease, unspecified: Secondary | ICD-10-CM | POA: Diagnosis not present

## 2020-12-17 DIAGNOSIS — I4891 Unspecified atrial fibrillation: Secondary | ICD-10-CM | POA: Diagnosis not present

## 2020-12-17 DIAGNOSIS — Z7901 Long term (current) use of anticoagulants: Secondary | ICD-10-CM | POA: Diagnosis not present

## 2020-12-17 DIAGNOSIS — I5032 Chronic diastolic (congestive) heart failure: Secondary | ICD-10-CM | POA: Diagnosis not present

## 2020-12-17 DIAGNOSIS — G4733 Obstructive sleep apnea (adult) (pediatric): Secondary | ICD-10-CM | POA: Diagnosis not present

## 2020-12-17 DIAGNOSIS — Z86711 Personal history of pulmonary embolism: Secondary | ICD-10-CM | POA: Diagnosis not present

## 2020-12-17 DIAGNOSIS — I110 Hypertensive heart disease with heart failure: Secondary | ICD-10-CM | POA: Diagnosis not present

## 2020-12-17 DIAGNOSIS — K219 Gastro-esophageal reflux disease without esophagitis: Secondary | ICD-10-CM | POA: Diagnosis not present

## 2020-12-17 DIAGNOSIS — E785 Hyperlipidemia, unspecified: Secondary | ICD-10-CM | POA: Diagnosis not present

## 2020-12-17 DIAGNOSIS — I872 Venous insufficiency (chronic) (peripheral): Secondary | ICD-10-CM | POA: Diagnosis not present

## 2020-12-17 DIAGNOSIS — L97822 Non-pressure chronic ulcer of other part of left lower leg with fat layer exposed: Secondary | ICD-10-CM | POA: Diagnosis not present

## 2020-12-17 DIAGNOSIS — J9611 Chronic respiratory failure with hypoxia: Secondary | ICD-10-CM | POA: Diagnosis not present

## 2020-12-17 DIAGNOSIS — G8929 Other chronic pain: Secondary | ICD-10-CM | POA: Diagnosis not present

## 2020-12-17 DIAGNOSIS — Z9981 Dependence on supplemental oxygen: Secondary | ICD-10-CM | POA: Diagnosis not present

## 2020-12-17 DIAGNOSIS — I739 Peripheral vascular disease, unspecified: Secondary | ICD-10-CM | POA: Diagnosis not present

## 2020-12-17 DIAGNOSIS — Z87891 Personal history of nicotine dependence: Secondary | ICD-10-CM | POA: Diagnosis not present

## 2020-12-17 NOTE — Telephone Encounter (Signed)
Left detailed message on Sharon Vasquez self-identified VM with VO to apply Unna boots twice weekly till healed.

## 2020-12-17 NOTE — Telephone Encounter (Signed)
Sophie, RN with [facility] called in stating patient has a LLE venous stasis ulcer. She is requesting VO for Unna boots 2 times per week till wound healed. States they did this before and wound was healed in 2-3 weeks. Please advise.

## 2020-12-17 NOTE — Telephone Encounter (Signed)
Per vascular surgery note, they were recommending wrapping ulcer with Ace bandage and noted that she would likely require Unna boots in the future. Agree with VO for Unna boots 2x per week until LLE ulcer healed.

## 2020-12-18 DIAGNOSIS — J449 Chronic obstructive pulmonary disease, unspecified: Secondary | ICD-10-CM | POA: Diagnosis not present

## 2020-12-19 NOTE — Progress Notes (Signed)
Office Visit    Patient Name: Sharon Vasquez Date of Encounter: 12/20/2020  PCP:  [name], DO   [facility]  Cardiologist:  Quay Burow, MD  Advanced Practice Provider:  No care team member to display Electrophysiologist:  None    Chief Complaint    Sharon Vasquez is a 70 y.o. female with a hx of PVD, essential hypertension, chronic venous insufficiency, aortic valve sclerosis, chronic diastolic heart failure, atrial fibrillation, obesity presents today for follow up of HFpEF  Past Medical History    Past Medical History:  Diagnosis Date   A-fib ([place])    Anxiety    Arthritis    "qwhre; joints, back" (04/17/2017)   Benign breast cyst in female, left 01/08/2017   Found by Screening mammogram, evaluated by U/S on 01/08/17 and determined to be a benign simple breast cyst.   Cellulitis of left lower leg 05/30/2017   CHF (congestive heart failure) ([place])    Chronic low back pain 08/21/2016   Chronic lower back pain    Chronic venous insufficiency    /notes 05/30/2017   COPD (chronic obstructive pulmonary disease) ([place])    Depression    DVT (deep venous thrombosis) ([place]) 11/16/2016   GERD (gastroesophageal reflux disease)    Headache    "weekly for the last 3 months" (04/17/2017)   Hyperlipidemia    Hypertension    Morbid obesity ([place])    PE (pulmonary embolism)    Pulmonary embolism ([place]) 09/21/2014   Sleep apnea    Past Surgical History:  Procedure Laterality Date   ABDOMINAL HYSTERECTOMY     APPENDECTOMY     BALLOON DILATION N/A 03/09/2020   Procedure: BALLOON DILATION;  Surgeon: Otis Brace, MD;  Location: [facility];  Service: Gastroenterology;  Laterality: N/A;   BIOPSY  03/09/2020   Procedure: BIOPSY;  Surgeon: Otis Brace, MD;  Location: [facility];  Service: Gastroenterology;;   BREAST CYST EXCISION Left    "six o'clock"   BREAST LUMPECTOMY Left    CHOLECYSTECTOMY     DILATION AND CURETTAGE OF UTERUS      ESOPHAGOGASTRODUODENOSCOPY (EGD) WITH PROPOFOL N/A 03/09/2020   Procedure: ESOPHAGOGASTRODUODENOSCOPY (EGD) WITH PROPOFOL;  Surgeon: Otis Brace, MD;  Location: [facility];  Service: Gastroenterology;  Laterality: N/A;   IR ABLATE LIVER CRYOABLATION  07/23/2019   IR RADIOLOGIST EVAL & MGMT  07/18/2019   TONSILLECTOMY AND ADENOIDECTOMY     TUBAL LIGATION      Allergies  No Known Allergies  History of Present Illness    Sharon Vasquez is a 70 y.o. female with a hx of PVD, essential hypertension, chronic venous insufficiency, aortic valve sclerosis, nonobstructive CAD, OSA, hyperlipidemia chronic diastolic heart failure, atrial fibrillation, obesity last seen 10/22/20.  Initially evaluated today in 2018 after admission diastolic heart failure and atrial fibrillation.  She had PE and was placed on Xarelto.  CT chest September 2019 showed left main/three-vessel coronary disease.  She did complain of atypical chest pain.  She underwent sleep study and was placed on CPAP.  She had coronary CTA with physiologically significant lesion and small nondominant RCA.  Echo April 2018 with normal LVEF.  She was hospitalized January 2022 with COPD exacerbation.  Treated with antibiotics and steroid taper.  Last seen in clinic 08/11/2020 doing overall well.  Weight has started to go down since stopping prednisone.  No changes were made.  She was admitted 09/2020 at [facility] for acute pulmonary edema.  [place] with mild cardiomegaly and mild interstitial pulmonary edema.  She was treated with Lasix, Lotensin, aspirin.  She was also treated for COPD with prednisone and albuterol.    She was seen in the hospital 10/08/2020 with chest pain that was overall atypical.  Her lisinopril was stopped and started on Imdur.  Stress testing was offered but she preferred medical therapy due to anxiety.  Seen 10/22/20 for follow up with her sister. She noted nonhealing venous stasis ulcer on her left lower extremity  and provided verbal order for [facility] to place Unna boot. He LE edema was otherwise improving. She had self reduced metoprolol to '25mg'$  QD due to hypotension and this dose was continued.   Since last seen  evaluated by vascular surgery deemed poor candidate for laser ablation and with only mild PAD, venous insufficiency thought more contributory to poor healing and edema. Elevation, compression, and weight loss were encouraged. When seen 12/08/20 by pulmonology wheezing noted and Muccinex and Prednisone taper were prescribed.  She presents today for follow up. Reports no chest pain, pressure, tightness. Tells me her leg wound healed but has a small abrasion. Reports sensation of not being able to get food all the way down. Tells me she previously had her esophagus stretched and wonders whether she needs to have this done again. Tells ms he eats very small meals. Endorses drinking <2L fluid. Tells me her breathing is improved since completing prednisone course as prescribed by pulmonology.   EKGs/Labs/Other Studies Reviewed:   The following studies were reviewed today:  Echo 10/08/20 1. Left ventricular ejection fraction, by estimation, is 60 to 65%. The  left ventricle has normal function. The left ventricle has no regional  wall motion abnormalities. The left ventricular internal cavity size was  mildly dilated. There is moderate  left ventricular hypertrophy. Left ventricular diastolic parameters are  indeterminate.   2. Right ventricular systolic function is normal. The right ventricular  size is mildly enlarged. There is mildly elevated pulmonary artery  systolic pressure. The estimated right ventricular systolic pressure is  123456 mmHg.   3. Left atrial size was mildly dilated.   4. Right atrial size was mildly dilated.   5. The mitral valve is normal in structure. No evidence of mitral valve  regurgitation.   6. Tricuspid valve regurgitation is mild to moderate.   7. The aortic valve is  tricuspid. Aortic valve regurgitation is not  visualized. Mild to moderate aortic valve sclerosis/calcification is  present, without any evidence of aortic stenosis.   8. The inferior vena cava is dilated in size with >50% respiratory  variability, suggesting right atrial pressure of 8 mmHg.   Echocardiogram 09/30/2019   IMPRESSIONS     1. Left ventricular ejection fraction, by estimation, is 65 to 70%. The  left ventricle has hyperdynamic function. The left ventricle has no  regional wall motion abnormalities. The left ventricular internal cavity  size was mildly dilated. There is mild  concentric left ventricular hypertrophy. Left ventricular diastolic  parameters are consistent with Grade II diastolic dysfunction  (pseudonormalization). Elevated left atrial pressure.   2. Right ventricular systolic function is hyperdynamic. The right  ventricular size is normal.   3. Left atrial size was moderately dilated.   4. The mitral valve is normal in structure. Trivial mitral valve  regurgitation.   5. The aortic valve is normal in structure. Aortic valve regurgitation is  not visualized. Mild aortic valve sclerosis is present, with no evidence  of aortic valve  stenosis.   6. The inferior vena cava is dilated in size with >50% respiratory  variability, suggesting right atrial pressure of 8 mmHg.   Comparison(s): Prior images reviewed side by side. The left ventricular  function is unchanged. The left ventricular diastolic function is  significantly worse. Findings are consistent ith high output diastolic  heart failure, such as obesity cardiomyopathy   (should exclude anemia, thyrotoxicosis, AV fistula, etc.).  Coronary CTA 05/07/2018: Impressions: 1. Coronary calcium score of 2110. This was 54 percentile for age and sex matched control. 2. Normal coronary origin with left dominance. 3. Moderate to severe diffuse CAD with stenosis 50-69% in the proximal LAD, distal LCX arteries and  suspicion for a severe stenosis in the mid portion of the 1. diagonal artery. Additional analysis with CT FFR will be submitted. 4. Moderately dilated pulmonary artery measuring 37 mm suspicious of pulmonary hypertension.   FFRct analysis was performed on the original cardiac CT angiogram dataset. Diagrammatic representation of the FFRct analysis is provided in a separate PDF document in PACS. This dictation was created using the PDF document and an interactive 3D model of the results. 3D model is not available in the EMR/PACS. Normal FFR range is >0.80.   1. Left Main:  No significant stenosis. 2. LAD: No significant stenosis. 3. D1: No significant stenosis. 4. Ramus intermedius: No significant stenosis. 5. LCX: No significant stenosis. 6. RCA: Proximal: 1.0, mid 0.61.   Impressions: 1. CT FFR analysis showed significant stenosis in the proximal portion of a small non-dominant RCA. An aggressive medical management is recommended.   EKG:  No EKG is  ordered today.  The ekg independently reviewed from 10/22/20 demonstrated  rate controlled atrial fibrillation 86 bpm with minimal voltage criteria for LVH. Noted nonspecific T wave changes.   Recent Labs: 10/07/2020: ALT 17; B Natriuretic Peptide 75.0; Hemoglobin 13.0; Platelets 364 10/08/2020: BUN 12; Creatinine, Ser 0.98; Magnesium 1.6; Potassium 3.3; Sodium 140  Recent Lipid Panel    Component Value Date/Time   CHOL 142 05/14/2020 1033   TRIG 214 (H) 05/14/2020 1033   HDL 25 (L) 05/14/2020 1033   CHOLHDL 5.7 (H) 05/14/2020 1033   LDLCALC 81 05/14/2020 1033    Risk Assessment/Calculations:   CHA2DS2-VASc Score = 5  This indicates a 7.2% annual risk of stroke. The patient's score is based upon: CHF History: 1 HTN History: 1 Diabetes History: 0 Stroke History: 0 Vascular Disease History: 1 Age Score: 1 Gender Score: 1    Home Medications   Current Meds  Medication Sig   albuterol (VENTOLIN HFA) 108 (90 Base) MCG/ACT  inhaler Inhale into the lungs.   aspirin EC 81 MG tablet Take 81 mg by mouth daily.   atorvastatin (LIPITOR) 80 MG tablet TAKE 1 TABLET(80 MG) BY MOUTH DAILY   esomeprazole (NEXIUM) 40 MG capsule Take 1 capsule (40 mg total) by mouth daily as needed (for heartburn or indigestion). (Patient taking differently: Take 40 mg by mouth daily.)   Fluticasone-Salmeterol (ADVAIR) 250-50 MCG/DOSE AEPB Inhale 1 puff into the lungs 2 (two) times daily.   gabapentin (NEURONTIN) 400 MG capsule TAKE 1 CAPSULE BY MOUTH  TWICE DAILY (Patient taking differently: Take 400 mg by mouth 2 (two) times daily.)   ipratropium-albuterol (DUONEB) 0.5-2.5 (3) MG/3ML SOLN Take 3 mLs by nebulization every 6 (six) hours as needed. (Patient taking differently: Take 3 mLs by nebulization every 6 (six) hours as needed (shortness of breath/wheezing).)   isosorbide mononitrate (IMDUR) 30 MG 24 hr tablet TAKE  1 TABLET(30 MG) BY MOUTH DAILY   metoprolol succinate (TOPROL XL) 25 MG 24 hr tablet Take 1 tablet (25 mg total) by mouth daily.   mirabegron ER (MYRBETRIQ) 50 MG TB24 tablet Take 1 tablet (50 mg total) by mouth daily.   oxyCODONE (ROXICODONE) 15 MG immediate release tablet Take 15 mg by mouth 4 (four) times daily as needed for pain.   OXYGEN Inhale 2 L into the lungs as needed (shortness of breath).   potassium chloride (KLOR-CON) 10 MEQ tablet Take 1 tablet (10 mEq total) by mouth 2 (two) times daily.   PRESCRIPTION MEDICATION Inhale into the lungs at bedtime. cpap   rivaroxaban (XARELTO) 20 MG TABS tablet Take 1 tablet (20 mg total) by mouth every morning.   tiotropium (SPIRIVA HANDIHALER) 18 MCG inhalation capsule PLACE 1 CAPSULE INTO INHALER AND INHALE DAILY (Patient taking differently: Place 18 mcg into inhaler and inhale daily.)   torsemide (DEMADEX) 20 MG tablet Take 2 tablets by mouth daily, you may take an extra 1/2 tablet only as needed for weight gain of 2 lbs overnight or 5 lbs in a week     Review of Systems       All other systems reviewed and are otherwise negative except as noted above.  Physical Exam    VS:  BP 132/84   Pulse 90   Ht '6\' 2"'$  (1.88 m)   Wt (!) 350 lb (158.8 kg)   SpO2 97%   BMI 44.94 kg/m  , BMI Body mass index is 44.94 kg/m.  Wt Readings from Last 3 Encounters:  12/20/20 (!) 350 lb (158.8 kg)  11/15/20 (!) 384 lb 12.8 oz (174.5 kg)  11/04/20 (!) 380 lb (172.4 kg)     GEN: Well nourished, obese, well developed, in no acute distress. HEENT: normal. Neck: Supple, no JVD, carotid bruits, or masses. Cardiac: IRIR, no murmurs, rubs, or gallops. No clubbing, cyanosis, edema.  Radials/PT 2+ and equal bilaterally.  Respiratory:  Respirations regular and unlabored, clear to auscultation bilaterally. GI: Soft, nontender, nondistended. MS: No deformity or atrophy. Skin: Warm and dry, no rash. Neuro:  Strength and sensation are intact. Psych: Normal affect.  Assessment & Plan    HFpEF- Volume status difficult to ascertain given morbid obesity.  Weight is down. Continue torsemide 40 mg daily with additional 20 mg as needed for weight gain 2 pounds overnight or 5 pounds in 1 week.  Encourage low-salt, heart healthy diet.  Sodium restriction to less than 2 L encouraged.  Venous stasis ulcer -Continue to follow with vascular surgery. Leg elevation and ACE compression wraps encouraged.   HTN- BP well controlled. Continue current antihypertensive regimen.    Obesity- Weight loss via diet and exercise encouraged. Discussed the impact being overweight would have on cardiovascular risk. She is working with physical therapy to improve her stamina.  Persistent atrial fibrillation/chronic anticoagulation -overall asymptomatic with no palpitations.  Rate controlled today. Continue Xarelto 20 mg daily. Continue metoprolol succinate 25 mg daily.  For elevated rate consider increased dose.  Nonobstructive CAD - Stable with no anginal symptoms. No indication for ischemic evaluation.  GDMT  includes Imdur, beta-blocker, statin.  No recurrent chest pain since the addition of Imdur.  No aspirin due to chronic anticoagulation.  COPD /tobacco use - She has drastically reduced her cigarette usage. She is smoking <3 cigarettes in a 24 hour period. Continue to follow with PCP. Smoking cessation encouraged. Recommend utilization of 1800QUITNOW.   Disposition: Follow up in 6 months  with Dr. Gwenlyn Found or APP.  Signed, Loel Dubonnet, NP 12/20/2020, 10:43 AM [place]

## 2020-12-20 ENCOUNTER — Ambulatory Visit (INDEPENDENT_AMBULATORY_CARE_PROVIDER_SITE_OTHER): Payer: Medicare Other | Admitting: Family

## 2020-12-20 ENCOUNTER — Other Ambulatory Visit: Payer: Self-pay

## 2020-12-20 ENCOUNTER — Encounter (HOSPITAL_BASED_OUTPATIENT_CLINIC_OR_DEPARTMENT_OTHER): Payer: Self-pay | Admitting: Family

## 2020-12-20 VITALS — BP 132/84 | HR 90 | Ht 74.0 in | Wt 350.0 lb

## 2020-12-20 DIAGNOSIS — Z7901 Long term (current) use of anticoagulants: Secondary | ICD-10-CM | POA: Diagnosis not present

## 2020-12-20 DIAGNOSIS — Z720 Tobacco use: Secondary | ICD-10-CM | POA: Diagnosis not present

## 2020-12-20 DIAGNOSIS — I4821 Permanent atrial fibrillation: Secondary | ICD-10-CM

## 2020-12-20 DIAGNOSIS — I10 Essential (primary) hypertension: Secondary | ICD-10-CM

## 2020-12-20 DIAGNOSIS — I5032 Chronic diastolic (congestive) heart failure: Secondary | ICD-10-CM | POA: Diagnosis not present

## 2020-12-20 NOTE — Patient Instructions (Addendum)
Medication Instructions:  Continue your current medications.   *If you need a refill on your cardiac medications before your next appointment, please call your pharmacy*   Lab Work: None ordered today.   Testing/Procedures: None ordered today.   Follow-Up: At [facility], you and your health needs are our priority.  As part of our continuing mission to provide you with exceptional heart care, we have created designated Provider Care Teams.  These Care Teams include your primary Cardiologist (physician) and Advanced Practice Providers (APPs -  Physician Assistants and Nurse Practitioners) who all work together to provide you with the care you need, when you need it.  We recommend signing up for the patient portal called "MyChart".  Sign up information is provided on this After Visit Summary.  MyChart is used to connect with patients for Virtual Visits (Telemedicine).  Patients are able to view lab/test results, encounter notes, upcoming appointments, etc.  Non-urgent messages can be sent to your provider as well.   To learn more about what you can do with MyChart, go to [email or web address].    Your next appointment:   6 month(s)  The format for your next appointment:   In Person  Provider:   You may see Quay Burow, MD or one of the following Advanced Practice Providers on your designated Care Team:   [place], PA-C Coletta Memos, FNP  Other Instructions  Keep up the good work with your weight loss and quitting smoking. You've made great progress!!  Try using a sugar free hard candy to help keep your mouth moist. This will help you not to drink as much fluid. Our goal is for your to drink less than 2 liters (64 oz) per day.   Recommend contacting gastroenterology regarding your swallowing difficulties.   Heart Healthy Diet Recommendations: A low-salt diet is recommended. Meats should be grilled, baked, or boiled. Avoid fried foods. Focus on lean protein sources  like fish or chicken with vegetables and fruits. The American Heart Association is a Microbiologist!    Exercise recommendations: The American Heart Association recommends 150 minutes of moderate intensity exercise weekly. Try 30 minutes of moderate intensity exercise 4-5 times per week. This could include walking, jogging, or swimming.

## 2020-12-21 NOTE — Chronic Care Management (AMB) (Signed)
  Care Management   Note  12/21/2020 Name: FAREEDAH MAHLER [name]: [id number] DOB: 1950/10/26  MCCAYLA SHIMADA is a 70 y.o. year old female who is a primary care patient of [name], DO and is actively engaged with the care management team. I reached out to [name] by phone today to assist with re-scheduling a follow up visit with the RN Case Manager  Follow up plan: Telephone appointment with care management team member scheduled for:01/06/21  [place] [facility]  Direct Dial: [phone]

## 2020-12-22 DIAGNOSIS — K219 Gastro-esophageal reflux disease without esophagitis: Secondary | ICD-10-CM | POA: Diagnosis not present

## 2020-12-22 DIAGNOSIS — I110 Hypertensive heart disease with heart failure: Secondary | ICD-10-CM | POA: Diagnosis not present

## 2020-12-22 DIAGNOSIS — I4891 Unspecified atrial fibrillation: Secondary | ICD-10-CM | POA: Diagnosis not present

## 2020-12-22 DIAGNOSIS — Z87891 Personal history of nicotine dependence: Secondary | ICD-10-CM | POA: Diagnosis not present

## 2020-12-22 DIAGNOSIS — I872 Venous insufficiency (chronic) (peripheral): Secondary | ICD-10-CM | POA: Diagnosis not present

## 2020-12-22 DIAGNOSIS — Z9981 Dependence on supplemental oxygen: Secondary | ICD-10-CM | POA: Diagnosis not present

## 2020-12-22 DIAGNOSIS — Z86718 Personal history of other venous thrombosis and embolism: Secondary | ICD-10-CM | POA: Diagnosis not present

## 2020-12-22 DIAGNOSIS — Z86711 Personal history of pulmonary embolism: Secondary | ICD-10-CM | POA: Diagnosis not present

## 2020-12-22 DIAGNOSIS — L97822 Non-pressure chronic ulcer of other part of left lower leg with fat layer exposed: Secondary | ICD-10-CM | POA: Diagnosis not present

## 2020-12-22 DIAGNOSIS — G4733 Obstructive sleep apnea (adult) (pediatric): Secondary | ICD-10-CM | POA: Diagnosis not present

## 2020-12-22 DIAGNOSIS — I5032 Chronic diastolic (congestive) heart failure: Secondary | ICD-10-CM | POA: Diagnosis not present

## 2020-12-22 DIAGNOSIS — I739 Peripheral vascular disease, unspecified: Secondary | ICD-10-CM | POA: Diagnosis not present

## 2020-12-22 DIAGNOSIS — G8929 Other chronic pain: Secondary | ICD-10-CM | POA: Diagnosis not present

## 2020-12-22 DIAGNOSIS — J9611 Chronic respiratory failure with hypoxia: Secondary | ICD-10-CM | POA: Diagnosis not present

## 2020-12-22 DIAGNOSIS — M810 Age-related osteoporosis without current pathological fracture: Secondary | ICD-10-CM | POA: Diagnosis not present

## 2020-12-22 DIAGNOSIS — E785 Hyperlipidemia, unspecified: Secondary | ICD-10-CM | POA: Diagnosis not present

## 2020-12-22 DIAGNOSIS — M170 Bilateral primary osteoarthritis of knee: Secondary | ICD-10-CM | POA: Diagnosis not present

## 2020-12-22 DIAGNOSIS — Z7901 Long term (current) use of anticoagulants: Secondary | ICD-10-CM | POA: Diagnosis not present

## 2020-12-22 DIAGNOSIS — J449 Chronic obstructive pulmonary disease, unspecified: Secondary | ICD-10-CM | POA: Diagnosis not present

## 2020-12-24 DIAGNOSIS — Z86711 Personal history of pulmonary embolism: Secondary | ICD-10-CM | POA: Diagnosis not present

## 2020-12-24 DIAGNOSIS — I110 Hypertensive heart disease with heart failure: Secondary | ICD-10-CM | POA: Diagnosis not present

## 2020-12-24 DIAGNOSIS — J9611 Chronic respiratory failure with hypoxia: Secondary | ICD-10-CM | POA: Diagnosis not present

## 2020-12-24 DIAGNOSIS — M170 Bilateral primary osteoarthritis of knee: Secondary | ICD-10-CM | POA: Diagnosis not present

## 2020-12-24 DIAGNOSIS — I739 Peripheral vascular disease, unspecified: Secondary | ICD-10-CM | POA: Diagnosis not present

## 2020-12-24 DIAGNOSIS — M810 Age-related osteoporosis without current pathological fracture: Secondary | ICD-10-CM | POA: Diagnosis not present

## 2020-12-24 DIAGNOSIS — I4891 Unspecified atrial fibrillation: Secondary | ICD-10-CM | POA: Diagnosis not present

## 2020-12-24 DIAGNOSIS — Z87891 Personal history of nicotine dependence: Secondary | ICD-10-CM | POA: Diagnosis not present

## 2020-12-24 DIAGNOSIS — E785 Hyperlipidemia, unspecified: Secondary | ICD-10-CM | POA: Diagnosis not present

## 2020-12-24 DIAGNOSIS — Z86718 Personal history of other venous thrombosis and embolism: Secondary | ICD-10-CM | POA: Diagnosis not present

## 2020-12-24 DIAGNOSIS — G8929 Other chronic pain: Secondary | ICD-10-CM | POA: Diagnosis not present

## 2020-12-24 DIAGNOSIS — I5032 Chronic diastolic (congestive) heart failure: Secondary | ICD-10-CM | POA: Diagnosis not present

## 2020-12-24 DIAGNOSIS — J449 Chronic obstructive pulmonary disease, unspecified: Secondary | ICD-10-CM | POA: Diagnosis not present

## 2020-12-24 DIAGNOSIS — Z9981 Dependence on supplemental oxygen: Secondary | ICD-10-CM | POA: Diagnosis not present

## 2020-12-24 DIAGNOSIS — L97822 Non-pressure chronic ulcer of other part of left lower leg with fat layer exposed: Secondary | ICD-10-CM | POA: Diagnosis not present

## 2020-12-24 DIAGNOSIS — Z7901 Long term (current) use of anticoagulants: Secondary | ICD-10-CM | POA: Diagnosis not present

## 2020-12-24 DIAGNOSIS — I872 Venous insufficiency (chronic) (peripheral): Secondary | ICD-10-CM | POA: Diagnosis not present

## 2020-12-24 DIAGNOSIS — K219 Gastro-esophageal reflux disease without esophagitis: Secondary | ICD-10-CM | POA: Diagnosis not present

## 2020-12-24 DIAGNOSIS — G4733 Obstructive sleep apnea (adult) (pediatric): Secondary | ICD-10-CM | POA: Diagnosis not present

## 2020-12-27 DIAGNOSIS — E785 Hyperlipidemia, unspecified: Secondary | ICD-10-CM | POA: Diagnosis not present

## 2020-12-27 DIAGNOSIS — M170 Bilateral primary osteoarthritis of knee: Secondary | ICD-10-CM | POA: Diagnosis not present

## 2020-12-27 DIAGNOSIS — I4891 Unspecified atrial fibrillation: Secondary | ICD-10-CM | POA: Diagnosis not present

## 2020-12-27 DIAGNOSIS — G8929 Other chronic pain: Secondary | ICD-10-CM | POA: Diagnosis not present

## 2020-12-27 DIAGNOSIS — I872 Venous insufficiency (chronic) (peripheral): Secondary | ICD-10-CM | POA: Diagnosis not present

## 2020-12-27 DIAGNOSIS — Z9981 Dependence on supplemental oxygen: Secondary | ICD-10-CM | POA: Diagnosis not present

## 2020-12-27 DIAGNOSIS — G4733 Obstructive sleep apnea (adult) (pediatric): Secondary | ICD-10-CM | POA: Diagnosis not present

## 2020-12-27 DIAGNOSIS — Z7901 Long term (current) use of anticoagulants: Secondary | ICD-10-CM | POA: Diagnosis not present

## 2020-12-27 DIAGNOSIS — J9611 Chronic respiratory failure with hypoxia: Secondary | ICD-10-CM | POA: Diagnosis not present

## 2020-12-27 DIAGNOSIS — I110 Hypertensive heart disease with heart failure: Secondary | ICD-10-CM | POA: Diagnosis not present

## 2020-12-27 DIAGNOSIS — Z86711 Personal history of pulmonary embolism: Secondary | ICD-10-CM | POA: Diagnosis not present

## 2020-12-27 DIAGNOSIS — Z87891 Personal history of nicotine dependence: Secondary | ICD-10-CM | POA: Diagnosis not present

## 2020-12-27 DIAGNOSIS — I5032 Chronic diastolic (congestive) heart failure: Secondary | ICD-10-CM | POA: Diagnosis not present

## 2020-12-27 DIAGNOSIS — J449 Chronic obstructive pulmonary disease, unspecified: Secondary | ICD-10-CM | POA: Diagnosis not present

## 2020-12-27 DIAGNOSIS — I739 Peripheral vascular disease, unspecified: Secondary | ICD-10-CM | POA: Diagnosis not present

## 2020-12-27 DIAGNOSIS — M810 Age-related osteoporosis without current pathological fracture: Secondary | ICD-10-CM | POA: Diagnosis not present

## 2020-12-27 DIAGNOSIS — L97822 Non-pressure chronic ulcer of other part of left lower leg with fat layer exposed: Secondary | ICD-10-CM | POA: Diagnosis not present

## 2020-12-27 DIAGNOSIS — Z86718 Personal history of other venous thrombosis and embolism: Secondary | ICD-10-CM | POA: Diagnosis not present

## 2020-12-27 DIAGNOSIS — K219 Gastro-esophageal reflux disease without esophagitis: Secondary | ICD-10-CM | POA: Diagnosis not present

## 2020-12-29 DIAGNOSIS — Z7901 Long term (current) use of anticoagulants: Secondary | ICD-10-CM | POA: Diagnosis not present

## 2020-12-29 DIAGNOSIS — J9611 Chronic respiratory failure with hypoxia: Secondary | ICD-10-CM | POA: Diagnosis not present

## 2020-12-29 DIAGNOSIS — K219 Gastro-esophageal reflux disease without esophagitis: Secondary | ICD-10-CM | POA: Diagnosis not present

## 2020-12-29 DIAGNOSIS — I739 Peripheral vascular disease, unspecified: Secondary | ICD-10-CM | POA: Diagnosis not present

## 2020-12-29 DIAGNOSIS — Z86711 Personal history of pulmonary embolism: Secondary | ICD-10-CM | POA: Diagnosis not present

## 2020-12-29 DIAGNOSIS — J449 Chronic obstructive pulmonary disease, unspecified: Secondary | ICD-10-CM | POA: Diagnosis not present

## 2020-12-29 DIAGNOSIS — M810 Age-related osteoporosis without current pathological fracture: Secondary | ICD-10-CM | POA: Diagnosis not present

## 2020-12-29 DIAGNOSIS — I872 Venous insufficiency (chronic) (peripheral): Secondary | ICD-10-CM | POA: Diagnosis not present

## 2020-12-29 DIAGNOSIS — G4733 Obstructive sleep apnea (adult) (pediatric): Secondary | ICD-10-CM | POA: Diagnosis not present

## 2020-12-29 DIAGNOSIS — M170 Bilateral primary osteoarthritis of knee: Secondary | ICD-10-CM | POA: Diagnosis not present

## 2020-12-29 DIAGNOSIS — I4891 Unspecified atrial fibrillation: Secondary | ICD-10-CM | POA: Diagnosis not present

## 2020-12-29 DIAGNOSIS — Z9981 Dependence on supplemental oxygen: Secondary | ICD-10-CM | POA: Diagnosis not present

## 2020-12-29 DIAGNOSIS — Z86718 Personal history of other venous thrombosis and embolism: Secondary | ICD-10-CM | POA: Diagnosis not present

## 2020-12-29 DIAGNOSIS — E785 Hyperlipidemia, unspecified: Secondary | ICD-10-CM | POA: Diagnosis not present

## 2020-12-29 DIAGNOSIS — I5032 Chronic diastolic (congestive) heart failure: Secondary | ICD-10-CM | POA: Diagnosis not present

## 2020-12-29 DIAGNOSIS — L97822 Non-pressure chronic ulcer of other part of left lower leg with fat layer exposed: Secondary | ICD-10-CM | POA: Diagnosis not present

## 2020-12-29 DIAGNOSIS — Z87891 Personal history of nicotine dependence: Secondary | ICD-10-CM | POA: Diagnosis not present

## 2020-12-29 DIAGNOSIS — I110 Hypertensive heart disease with heart failure: Secondary | ICD-10-CM | POA: Diagnosis not present

## 2020-12-29 DIAGNOSIS — G8929 Other chronic pain: Secondary | ICD-10-CM | POA: Diagnosis not present

## 2020-12-30 DIAGNOSIS — Z7901 Long term (current) use of anticoagulants: Secondary | ICD-10-CM | POA: Diagnosis not present

## 2020-12-30 DIAGNOSIS — I5032 Chronic diastolic (congestive) heart failure: Secondary | ICD-10-CM | POA: Diagnosis not present

## 2020-12-30 DIAGNOSIS — G8929 Other chronic pain: Secondary | ICD-10-CM | POA: Diagnosis not present

## 2020-12-30 DIAGNOSIS — Z86711 Personal history of pulmonary embolism: Secondary | ICD-10-CM | POA: Diagnosis not present

## 2020-12-30 DIAGNOSIS — J9611 Chronic respiratory failure with hypoxia: Secondary | ICD-10-CM | POA: Diagnosis not present

## 2020-12-30 DIAGNOSIS — I4891 Unspecified atrial fibrillation: Secondary | ICD-10-CM | POA: Diagnosis not present

## 2020-12-30 DIAGNOSIS — K219 Gastro-esophageal reflux disease without esophagitis: Secondary | ICD-10-CM | POA: Diagnosis not present

## 2020-12-30 DIAGNOSIS — J449 Chronic obstructive pulmonary disease, unspecified: Secondary | ICD-10-CM | POA: Diagnosis not present

## 2020-12-30 DIAGNOSIS — M170 Bilateral primary osteoarthritis of knee: Secondary | ICD-10-CM | POA: Diagnosis not present

## 2020-12-30 DIAGNOSIS — I110 Hypertensive heart disease with heart failure: Secondary | ICD-10-CM | POA: Diagnosis not present

## 2020-12-30 DIAGNOSIS — E785 Hyperlipidemia, unspecified: Secondary | ICD-10-CM | POA: Diagnosis not present

## 2020-12-30 DIAGNOSIS — I872 Venous insufficiency (chronic) (peripheral): Secondary | ICD-10-CM | POA: Diagnosis not present

## 2020-12-30 DIAGNOSIS — M810 Age-related osteoporosis without current pathological fracture: Secondary | ICD-10-CM | POA: Diagnosis not present

## 2020-12-30 DIAGNOSIS — Z86718 Personal history of other venous thrombosis and embolism: Secondary | ICD-10-CM | POA: Diagnosis not present

## 2020-12-30 DIAGNOSIS — Z9981 Dependence on supplemental oxygen: Secondary | ICD-10-CM | POA: Diagnosis not present

## 2020-12-30 DIAGNOSIS — L97822 Non-pressure chronic ulcer of other part of left lower leg with fat layer exposed: Secondary | ICD-10-CM | POA: Diagnosis not present

## 2020-12-30 DIAGNOSIS — I739 Peripheral vascular disease, unspecified: Secondary | ICD-10-CM | POA: Diagnosis not present

## 2020-12-30 DIAGNOSIS — Z87891 Personal history of nicotine dependence: Secondary | ICD-10-CM | POA: Diagnosis not present

## 2020-12-30 DIAGNOSIS — G4733 Obstructive sleep apnea (adult) (pediatric): Secondary | ICD-10-CM | POA: Diagnosis not present

## 2021-01-03 DIAGNOSIS — I739 Peripheral vascular disease, unspecified: Secondary | ICD-10-CM | POA: Diagnosis not present

## 2021-01-03 DIAGNOSIS — I110 Hypertensive heart disease with heart failure: Secondary | ICD-10-CM | POA: Diagnosis not present

## 2021-01-03 DIAGNOSIS — M810 Age-related osteoporosis without current pathological fracture: Secondary | ICD-10-CM | POA: Diagnosis not present

## 2021-01-03 DIAGNOSIS — L97822 Non-pressure chronic ulcer of other part of left lower leg with fat layer exposed: Secondary | ICD-10-CM | POA: Diagnosis not present

## 2021-01-03 DIAGNOSIS — Z86711 Personal history of pulmonary embolism: Secondary | ICD-10-CM | POA: Diagnosis not present

## 2021-01-03 DIAGNOSIS — M170 Bilateral primary osteoarthritis of knee: Secondary | ICD-10-CM | POA: Diagnosis not present

## 2021-01-03 DIAGNOSIS — Z7901 Long term (current) use of anticoagulants: Secondary | ICD-10-CM | POA: Diagnosis not present

## 2021-01-03 DIAGNOSIS — K219 Gastro-esophageal reflux disease without esophagitis: Secondary | ICD-10-CM | POA: Diagnosis not present

## 2021-01-03 DIAGNOSIS — J449 Chronic obstructive pulmonary disease, unspecified: Secondary | ICD-10-CM | POA: Diagnosis not present

## 2021-01-03 DIAGNOSIS — I872 Venous insufficiency (chronic) (peripheral): Secondary | ICD-10-CM | POA: Diagnosis not present

## 2021-01-03 DIAGNOSIS — Z9981 Dependence on supplemental oxygen: Secondary | ICD-10-CM | POA: Diagnosis not present

## 2021-01-03 DIAGNOSIS — I4891 Unspecified atrial fibrillation: Secondary | ICD-10-CM | POA: Diagnosis not present

## 2021-01-03 DIAGNOSIS — G8929 Other chronic pain: Secondary | ICD-10-CM | POA: Diagnosis not present

## 2021-01-03 DIAGNOSIS — J9611 Chronic respiratory failure with hypoxia: Secondary | ICD-10-CM | POA: Diagnosis not present

## 2021-01-03 DIAGNOSIS — Z86718 Personal history of other venous thrombosis and embolism: Secondary | ICD-10-CM | POA: Diagnosis not present

## 2021-01-03 DIAGNOSIS — Z87891 Personal history of nicotine dependence: Secondary | ICD-10-CM | POA: Diagnosis not present

## 2021-01-03 DIAGNOSIS — G4733 Obstructive sleep apnea (adult) (pediatric): Secondary | ICD-10-CM | POA: Diagnosis not present

## 2021-01-03 DIAGNOSIS — I5032 Chronic diastolic (congestive) heart failure: Secondary | ICD-10-CM | POA: Diagnosis not present

## 2021-01-03 DIAGNOSIS — E785 Hyperlipidemia, unspecified: Secondary | ICD-10-CM | POA: Diagnosis not present

## 2021-01-05 DIAGNOSIS — F17210 Nicotine dependence, cigarettes, uncomplicated: Secondary | ICD-10-CM | POA: Diagnosis not present

## 2021-01-05 DIAGNOSIS — M170 Bilateral primary osteoarthritis of knee: Secondary | ICD-10-CM | POA: Diagnosis not present

## 2021-01-05 DIAGNOSIS — Z79899 Other long term (current) drug therapy: Secondary | ICD-10-CM | POA: Diagnosis not present

## 2021-01-06 ENCOUNTER — Ambulatory Visit: Payer: Medicare Other

## 2021-01-06 DIAGNOSIS — I5032 Chronic diastolic (congestive) heart failure: Secondary | ICD-10-CM | POA: Diagnosis not present

## 2021-01-06 DIAGNOSIS — L97822 Non-pressure chronic ulcer of other part of left lower leg with fat layer exposed: Secondary | ICD-10-CM | POA: Diagnosis not present

## 2021-01-06 DIAGNOSIS — I110 Hypertensive heart disease with heart failure: Secondary | ICD-10-CM | POA: Diagnosis not present

## 2021-01-06 DIAGNOSIS — Z86711 Personal history of pulmonary embolism: Secondary | ICD-10-CM | POA: Diagnosis not present

## 2021-01-06 DIAGNOSIS — M810 Age-related osteoporosis without current pathological fracture: Secondary | ICD-10-CM | POA: Diagnosis not present

## 2021-01-06 DIAGNOSIS — M170 Bilateral primary osteoarthritis of knee: Secondary | ICD-10-CM | POA: Diagnosis not present

## 2021-01-06 DIAGNOSIS — Z9981 Dependence on supplemental oxygen: Secondary | ICD-10-CM | POA: Diagnosis not present

## 2021-01-06 DIAGNOSIS — J9611 Chronic respiratory failure with hypoxia: Secondary | ICD-10-CM | POA: Diagnosis not present

## 2021-01-06 DIAGNOSIS — G4733 Obstructive sleep apnea (adult) (pediatric): Secondary | ICD-10-CM | POA: Diagnosis not present

## 2021-01-06 DIAGNOSIS — J449 Chronic obstructive pulmonary disease, unspecified: Secondary | ICD-10-CM | POA: Diagnosis not present

## 2021-01-06 DIAGNOSIS — I739 Peripheral vascular disease, unspecified: Secondary | ICD-10-CM | POA: Diagnosis not present

## 2021-01-06 DIAGNOSIS — E785 Hyperlipidemia, unspecified: Secondary | ICD-10-CM | POA: Diagnosis not present

## 2021-01-06 DIAGNOSIS — K219 Gastro-esophageal reflux disease without esophagitis: Secondary | ICD-10-CM | POA: Diagnosis not present

## 2021-01-06 DIAGNOSIS — G8929 Other chronic pain: Secondary | ICD-10-CM | POA: Diagnosis not present

## 2021-01-06 DIAGNOSIS — Z86718 Personal history of other venous thrombosis and embolism: Secondary | ICD-10-CM | POA: Diagnosis not present

## 2021-01-06 DIAGNOSIS — Z7901 Long term (current) use of anticoagulants: Secondary | ICD-10-CM | POA: Diagnosis not present

## 2021-01-06 DIAGNOSIS — Z87891 Personal history of nicotine dependence: Secondary | ICD-10-CM | POA: Diagnosis not present

## 2021-01-06 DIAGNOSIS — I872 Venous insufficiency (chronic) (peripheral): Secondary | ICD-10-CM | POA: Diagnosis not present

## 2021-01-06 DIAGNOSIS — I4891 Unspecified atrial fibrillation: Secondary | ICD-10-CM | POA: Diagnosis not present

## 2021-01-06 NOTE — Patient Instructions (Signed)
Visit Information   Goals Addressed             This Visit's Progress    Make and Keep All Appointments       Timeframe:  Long-Range Goal Priority:  High Start Date:            05/10/20                 Expected End Date:    ongoing                  Follow Up Date 01/30/21   - arrange a ride through an agency 1 week before appointment - ask family or friend for a ride - call to cancel if needed - keep a calendar with prescription refill dates - keep a calendar with appointment dates - learn the bus route - use public transportation    Why is this important?   Part of staying healthy is seeing the doctor for follow-up care.  If you forget your appointments, there are some things you can do to stay on track.    Notes: 11/15/20 meeting goal     Track and Manage Fluids and Swelling-Heart Failure       Timeframe:  Long-Range Goal Priority:  High Start Date:     06/24/19                        Expected End Date:    ongoing                  Follow Up Date 01/30/21   - keep legs up while sitting - use salt in moderation - watch for swelling in feet, ankles and legs every day    Why is this important?   It is important to check your weight daily and watch how much salt and liquids you have.  It will help you to manage your heart failure.    Notes:      Track and Manage My Blood Pressure-Hypertension       Timeframe:  Long-Range Goal Priority:  High Start Date:      06/24/19                       Expected End Date:      ongoing               Follow Up Date 01/30/21   - check blood pressure weekly    Why is this important?   You won't feel high blood pressure, but it can still hurt your blood vessels.  High blood pressure can cause heart or kidney problems. It can also cause a stroke.  Making lifestyle changes like losing a little weight or eating less salt will help.  Checking your blood pressure at home and at different times of the day can help to control blood  pressure.  If the doctor prescribes medicine remember to take it the way the doctor ordered.  Call the office if you cannot afford the medicine or if there are questions about it.     Notes:      Track and Manage My Symptoms-COPD       Timeframe:  Long-Range Goal Priority:  High Start Date:      06/24/19                       Expected End Date:  Follow Up Date 01/30/21   - eliminate symptom triggers at home - keep follow-up appointments - use an extra pillow to sleep    Why is this important?   Tracking your symptoms and other information about your health helps your doctor plan your care.  Write down the symptoms, the time of day, what you were doing and what medicine you are taking.  You will soon learn how to manage your symptoms.     Notes:         The patient verbalized understanding of instructions, educational materials, and care plan provided today and declined offer to receive copy of patient instructions, educational materials, and care plan.   Telephone follow up appointment with care management team member scheduled for:02/10/21@11am .  Johnney Killian, RN, BSN, CCM Care Management Coordinator [facility] Phone: [phone] / Fax: [phone]

## 2021-01-06 NOTE — Chronic Care Management (AMB) (Signed)
Care Management    RN Visit Note  01/06/2021 Name: Sharon Vasquez [name]: [id number] DOB: 1950/10/18  Subjective: Sharon Vasquez is a 70 y.o. year old female who is a primary care patient of Atway, Rayann N, DO. The care management team was consulted for assistance with disease management and care coordination needs.    Engaged with patient by telephone for follow up visit in response to provider referral for case management and/or care coordination services.   Consent to Services:   Sharon Vasquez was given information about Care Management services today including:  Care Management services includes personalized support from designated clinical staff supervised by her physician, including individualized plan of care and coordination with other care providers 24/7 contact phone numbers for assistance for urgent and routine care needs. The patient may stop case management services at any time by phone call to the office staff.  Patient agreed to services and consent obtained.    Assessment: Patient is making progress with leg wound healed per [facility] nurse visit from today . See Care Plan below for interventions and patient self-care actives. Follow up Plan: Patient would like continued follow-up.  CCM RNCM will outreach the patient within the next 30-45 days.  Patient will call office if needed prior to next encounter  Review of patient past medical history, allergies, medications, health status, including review of consultants reports, laboratory and other test data, was performed as part of comprehensive evaluation and provision of chronic care management services.   SDOH (Social Determinants of Health) assessments and interventions performed:    Care Plan  No Known Allergies  Outpatient Encounter Medications as of 01/06/2021  Medication Sig   albuterol (VENTOLIN HFA) 108 (90 Base) MCG/ACT inhaler Inhale into the lungs.   aspirin EC 81 MG tablet Take 81 mg by mouth daily.   atorvastatin  (LIPITOR) 80 MG tablet TAKE 1 TABLET(80 MG) BY MOUTH DAILY   esomeprazole (NEXIUM) 40 MG capsule Take 1 capsule (40 mg total) by mouth daily as needed (for heartburn or indigestion). (Patient taking differently: Take 40 mg by mouth daily.)   Fluticasone-Salmeterol (ADVAIR) 250-50 MCG/DOSE AEPB Inhale 1 puff into the lungs 2 (two) times daily.   gabapentin (NEURONTIN) 400 MG capsule TAKE 1 CAPSULE BY MOUTH  TWICE DAILY (Patient taking differently: Take 400 mg by mouth 2 (two) times daily.)   ipratropium-albuterol (DUONEB) 0.5-2.5 (3) MG/3ML SOLN Take 3 mLs by nebulization every 6 (six) hours as needed. (Patient taking differently: Take 3 mLs by nebulization every 6 (six) hours as needed (shortness of breath/wheezing).)   isosorbide mononitrate (IMDUR) 30 MG 24 hr tablet TAKE 1 TABLET(30 MG) BY MOUTH DAILY   metoprolol succinate (TOPROL XL) 25 MG 24 hr tablet Take 1 tablet (25 mg total) by mouth daily.   mirabegron ER (MYRBETRIQ) 50 MG TB24 tablet Take 1 tablet (50 mg total) by mouth daily.   oxyCODONE (ROXICODONE) 15 MG immediate release tablet Take 15 mg by mouth 4 (four) times daily as needed for pain.   OXYGEN Inhale 2 L into the lungs as needed (shortness of breath).   potassium chloride (KLOR-CON) 10 MEQ tablet Take 1 tablet (10 mEq total) by mouth 2 (two) times daily.   PRESCRIPTION MEDICATION Inhale into the lungs at bedtime. cpap   rivaroxaban (XARELTO) 20 MG TABS tablet Take 1 tablet (20 mg total) by mouth every morning.   tiotropium (SPIRIVA HANDIHALER) 18 MCG inhalation capsule PLACE 1 CAPSULE INTO INHALER AND INHALE DAILY (Patient taking differently:  Place 18 mcg into inhaler and inhale daily.)   torsemide (DEMADEX) 20 MG tablet Take 2 tablets by mouth daily, you may take an extra 1/2 tablet only as needed for weight gain of 2 lbs overnight or 5 lbs in a week   No facility-administered encounter medications on file as of 01/06/2021.    Patient Active Problem List   Diagnosis Date Noted    Chest pain 10/08/2020   Chronic heart failure with preserved ejection fraction (HFpEF) (HCC)    Mixed simple and mucopurulent chronic bronchitis (HCC)    Paroxysmal atrial fibrillation (HCC)    Osteoarthritis of left hip 10/07/2020   Chronic venous hypertension w ulcer of l low extrem ([place]) 10/07/2020   Venous stasis ulcer of left lower extremity ([place]) 08/18/2020   Hyperlipidemia 05/14/2020   Hypertensive urgency 04/10/2020   Swallowing difficulty 11/10/2019   Chronic respiratory failure with hypoxia ([place]) 09/30/2019   Mixed incontinence, urge and stress (female) (female) 08/21/2019   Overgrown toenails 07/01/2019   Preventative health care 06/04/2019   GERD (gastroesophageal reflux disease) 01/20/2019   Coronary artery calcification seen on CT scan 04/12/2018   ILD (interstitial lung disease) ([place]) 02/13/2018   Pain and numbness of right upper extremity 02/08/2018   Long term current use of amiodarone 12/19/2017   OSA (obstructive sleep apnea) 11/14/2017   Chronic pain syndrome 07/27/2017   Major depression, recurrent, chronic ([place]) 02/01/2017   Osteoporosis 12/14/2016   Drug induced constipation 08/21/2016   Aortic atherosclerosis ([place]) 07/17/2016   Chronic venous insufficiency 07/12/2016   Chronic anticoagulation 07/12/2016   Tobacco use 07/11/2016   Acute exacerbation of chronic obstructive pulmonary disease (COPD) (HCC)    Diastolic heart failure ([place])    History of pulmonary embolism    Atrial fibrillation with controlled ventricular response (HCC)    Vocal cord polyp 12/18/2015   Laryngopharyngeal reflux 12/18/2015   Morbid obesity ([place])    Peripheral vascular disease ([place])    Benign essential HTN    Primary osteoarthritis of both knees 09/16/2014    Conditions to be addressed/monitored: CHF, HTN, and COPD  Care Plan : CCM RN- COPD (Adult)  Updates made by Johnney Killian, RN since 01/06/2021 12:00 AM     Problem: Symptom Exacerbation (COPD)      Goal: Symptom  Exacerbation Prevented or Minimized   Start Date: 06/24/2019  Expected End Date: 01/30/2021  Recent Progress: On track  Priority: High  Note:   CARE PLAN ENTRY (see longitudinal plan of care for additional care plan information)  Current Barriers:  Chronic Disease Management support, education, and care coordination needs related to Atrial Fibrillation, CHF, HTN, and COPD- Successful outreach to patient this morning.  Patient notes she has been working with home health physical therapy and she now has a bariatric walker and commode.  Patient is able to walk to the elevator from her apartment.  Patient has severe arthritis in her knees and she says when she walks she cannot straighten out her legs.  Pt has an English as a second language teacher at [employer].  Patient noted her neighbor was able to get a recline/lift chair from [employer].  This RNCM to look into Medicare requirements for coverage.   Clinical Goal(s) related to Atrial Fibrillation, CHF, HTN, and COPD:  Over the next 30 days, patient will:  Work with the care management team to address educational, disease management, and care coordination needs  Begin or continue self health monitoring activities as directed today Measure and record blood pressure  5-7 times per week and assess swelling in lower extremities daily, check O2 sats when short of breath  Call provider office for new or worsened signs and symptoms Blood pressure findings outside established parameters, breathing outside of baseline,  Shortness of breath, and New or worsened symptom related to CHF and COPD Call care management team with questions or concerns Verbalize basic understanding of patient centered plan of care established today  Interventions related to Atrial Fibrillation, CHF, HTN, and COPD:  Appropriate assessments completed Reviewed medications with patient and assessed medication taking behavior;  if indicated, discussed importance of medication  adherence  Discussed plans with patient for ongoing care management follow up and ensured patient has contact number for CCM team and clinic  Patient Self Care Activities related to Atrial Fibrillation, CHF, HTN, and COPD:  Patient is unable to independently self-manage chronic health conditions      Care Plan : CCM RN- Heart Failure  Updates made by Johnney Killian, RN since 01/06/2021 12:00 AM     Problem: Symptom Exacerbation (Heart Failure)      Goal: Symptom Exacerbation Prevented or Minimized   Start Date: 10/05/2020  Recent Progress: On track  Priority: High  Note:   Current Barriers: Patient notes she drinks <2L fluid. Tells me her breathing is improved since completing prednisone course as prescribed by pulmonology.  Knowledge deficits related to basic heart failure pathophysiology and self care management-  Unable to independently manage heart failure Nurse Case Manager Clinical Goal(s):  patient will weigh self daily and record unless she deems it unsafe to weigh  patient will verbalize understanding of Heart Failure Action Plan and when to call doctor patient will take all Heart Failure mediations as prescribed Interventions:  Collaboration with Atway, Rayann N, DO regarding development and update of comprehensive plan of care as evidenced by provider attestation and co-signature Inter-disciplinary care team collaboration (see longitudinal plan of care) Basic overview and discussion of pathophysiology of Heart Failure Provided written and verbal education on low sodium diet Reviewed Heart Failure Action Plan in depth and provided written copy- placed "Living Better With Heart Failure" patient information packet in envelope marked with patient name and DOB and placed at front desk for staff to give patient on 10/07/20 during her clinic visit  Assessed for scales in home Discussed importance of daily weight when she feels safe to weigh Discussed monitoring her O2 sats,  severity of dyspnea and swelling of legs and feet when she is unable to weight Reviewed signs and sxs requiring provider notification Reviewed role of diuretics in prevention of fluid overload Self-Care Activities:  Takes Heart Failure Medications as prescribed Weighs daily and record (notifying MD of 3 lb weight gain over night or 5 lb in a week) Verbalizes understanding of and follows CHF Action Plan Adheres to low sodium diet  Patient Goals:  - Take Heart Failure Medications as prescribed - Weigh daily and record (notify MD with 3 lb weight gain over night or 5 lb in a week) - Follow CHF Action Plan - Adhere to low sodium diet - follow rescue plan if symptoms flare-up - know when to call the doctor - track symptoms and what helps feel better or worse - pace activity allowing for rest - call office if I gain more than 2 pounds in one day or 5 pounds in one week - keep legs up while sitting - track weight in diary - use salt in moderation - watch for swelling in feet, ankles and  legs every day - weigh myself daily if safe to do so Follow Up Plan: The care management team will reach out to the patient again over the next 30-45 days.      Johnney Killian, RN, BSN, CCM Care Management Coordinator [facility] Internal Medicine Phone: [phone] / Fax: [phone]

## 2021-01-11 ENCOUNTER — Other Ambulatory Visit: Payer: Self-pay

## 2021-01-11 MED ORDER — METOPROLOL SUCCINATE ER 25 MG PO TB24: 25.0000 mg | ORAL_TABLET | Freq: Every day | ORAL | 0 refills | Status: DC

## 2021-01-11 MED ORDER — ISOSORBIDE MONONITRATE ER 30 MG PO TB24: ORAL_TABLET | ORAL | 0 refills | Status: DC

## 2021-01-12 DIAGNOSIS — K219 Gastro-esophageal reflux disease without esophagitis: Secondary | ICD-10-CM | POA: Diagnosis not present

## 2021-01-12 DIAGNOSIS — Z9981 Dependence on supplemental oxygen: Secondary | ICD-10-CM | POA: Diagnosis not present

## 2021-01-12 DIAGNOSIS — I739 Peripheral vascular disease, unspecified: Secondary | ICD-10-CM | POA: Diagnosis not present

## 2021-01-12 DIAGNOSIS — G8929 Other chronic pain: Secondary | ICD-10-CM | POA: Diagnosis not present

## 2021-01-12 DIAGNOSIS — I872 Venous insufficiency (chronic) (peripheral): Secondary | ICD-10-CM | POA: Diagnosis not present

## 2021-01-12 DIAGNOSIS — E785 Hyperlipidemia, unspecified: Secondary | ICD-10-CM | POA: Diagnosis not present

## 2021-01-12 DIAGNOSIS — J9611 Chronic respiratory failure with hypoxia: Secondary | ICD-10-CM | POA: Diagnosis not present

## 2021-01-12 DIAGNOSIS — J449 Chronic obstructive pulmonary disease, unspecified: Secondary | ICD-10-CM | POA: Diagnosis not present

## 2021-01-12 DIAGNOSIS — M170 Bilateral primary osteoarthritis of knee: Secondary | ICD-10-CM | POA: Diagnosis not present

## 2021-01-12 DIAGNOSIS — I5032 Chronic diastolic (congestive) heart failure: Secondary | ICD-10-CM | POA: Diagnosis not present

## 2021-01-12 DIAGNOSIS — Z86718 Personal history of other venous thrombosis and embolism: Secondary | ICD-10-CM | POA: Diagnosis not present

## 2021-01-12 DIAGNOSIS — Z79899 Other long term (current) drug therapy: Secondary | ICD-10-CM | POA: Diagnosis not present

## 2021-01-12 DIAGNOSIS — Z7901 Long term (current) use of anticoagulants: Secondary | ICD-10-CM | POA: Diagnosis not present

## 2021-01-12 DIAGNOSIS — I110 Hypertensive heart disease with heart failure: Secondary | ICD-10-CM | POA: Diagnosis not present

## 2021-01-12 DIAGNOSIS — L97822 Non-pressure chronic ulcer of other part of left lower leg with fat layer exposed: Secondary | ICD-10-CM | POA: Diagnosis not present

## 2021-01-12 DIAGNOSIS — Z87891 Personal history of nicotine dependence: Secondary | ICD-10-CM | POA: Diagnosis not present

## 2021-01-12 DIAGNOSIS — G4733 Obstructive sleep apnea (adult) (pediatric): Secondary | ICD-10-CM | POA: Diagnosis not present

## 2021-01-12 DIAGNOSIS — M810 Age-related osteoporosis without current pathological fracture: Secondary | ICD-10-CM | POA: Diagnosis not present

## 2021-01-12 DIAGNOSIS — I4891 Unspecified atrial fibrillation: Secondary | ICD-10-CM | POA: Diagnosis not present

## 2021-01-12 DIAGNOSIS — Z86711 Personal history of pulmonary embolism: Secondary | ICD-10-CM | POA: Diagnosis not present

## 2021-01-13 ENCOUNTER — Other Ambulatory Visit: Payer: Self-pay | Admitting: Internal Medicine

## 2021-01-13 DIAGNOSIS — G894 Chronic pain syndrome: Secondary | ICD-10-CM

## 2021-01-13 DIAGNOSIS — Z1231 Encounter for screening mammogram for malignant neoplasm of breast: Secondary | ICD-10-CM

## 2021-01-17 ENCOUNTER — Encounter: Payer: Self-pay | Admitting: Internal Medicine

## 2021-01-17 ENCOUNTER — Ambulatory Visit (INDEPENDENT_AMBULATORY_CARE_PROVIDER_SITE_OTHER): Payer: Medicare Other | Admitting: Internal Medicine

## 2021-01-17 VITALS — BP 115/80 | HR 86 | Temp 98.0°F | Wt 386.3 lb

## 2021-01-17 DIAGNOSIS — N644 Mastodynia: Secondary | ICD-10-CM

## 2021-01-17 DIAGNOSIS — K224 Dyskinesia of esophagus: Secondary | ICD-10-CM

## 2021-01-17 DIAGNOSIS — I5032 Chronic diastolic (congestive) heart failure: Secondary | ICD-10-CM | POA: Diagnosis not present

## 2021-01-17 DIAGNOSIS — J449 Chronic obstructive pulmonary disease, unspecified: Secondary | ICD-10-CM | POA: Diagnosis not present

## 2021-01-17 DIAGNOSIS — I872 Venous insufficiency (chronic) (peripheral): Secondary | ICD-10-CM

## 2021-01-17 DIAGNOSIS — R1319 Other dysphagia: Secondary | ICD-10-CM

## 2021-01-17 NOTE — Assessment & Plan Note (Signed)
Has changes of chronic venous stasis on bilateral lower extremities. She reports she has a Therapist, sports from home health agency that places [employer] for her and this was recently removed last week.   Plan: Replaced Unna boot in office

## 2021-01-17 NOTE — Progress Notes (Signed)
   CC: bilateral lower leg swelling, right breast pain, dysphagia  HPI:  Sharon Vasquez is a 70 y.o. female with PMHx as stated below presenting for evaluation of worsening bilateral lower extremity swelling, dysphagia for approximately one month duration and 1-2 weeks of right breast pain. Please see problem based charting for complete assessment and plan.  Past Medical History:  Diagnosis Date   A-fib [facility])    Anxiety    Arthritis    "qwhre; joints, back" (04/17/2017)   Benign breast cyst in female, left 01/08/2017   Found by Screening mammogram, evaluated by U/S on 01/08/17 and determined to be a benign simple breast cyst.   Cellulitis of left lower leg 05/30/2017   CHF (congestive heart failure) (HCC)    Chronic low back pain 08/21/2016   Chronic lower back pain    Chronic venous insufficiency    /notes 05/30/2017   COPD (chronic obstructive pulmonary disease) (HCC)    Depression    DVT (deep venous thrombosis) ([place]) 11/16/2016   GERD (gastroesophageal reflux disease)    Headache    "weekly for the last 3 months" (04/17/2017)   Hyperlipidemia    Hypertension    Morbid obesity ([place])    PE (pulmonary embolism)    Pulmonary embolism ([place]) 09/21/2014   Sleep apnea    Review of Systems:  Negative except as stated in HPI.  Physical Exam:  Vitals:   01/17/21 1007  BP: 115/80  Pulse: 86  Temp: 98 F (36.7 C)  TempSrc: Oral  SpO2: 97%  Weight: (!) 386 lb 4.8 oz (175.2 kg)   Physical Exam  Constitutional: Chronically ill appearing female, No acute distress.  HENT: Normocephalic and atraumatic, moist mucous membranes Cardiovascular: Normal rate, regular rhythm, S1 and S2 present, no murmurs, rubs, gallops.  Distal pulses intact Respiratory: No respiratory distress, no accessory muscle use. Fine bibasilar crackles on exam GI: Distended, soft, nontender to palpation, normal bowel sounds Musculoskeletal: Normal bulk and tone.  Significant BLE edema with evidence of chronic  venous insufficiency Neurological: Is alert and oriented x4, no apparent focal deficits noted. Skin: Warm and dry.  No rash, erythema, lesions noted. R breast with discoloration from prior I&D at 7 o'clock position; no appreciable cellulitis or indurations noted otherwise; nipple inverted without discharge Psychiatric: Normal mood and affect. Behavior is normal. Judgment and thought content normal.    Assessment & Plan:   See Encounters Tab for problem based charting.  Patient discussed with Dr. Philipp Ovens

## 2021-01-17 NOTE — Assessment & Plan Note (Addendum)
Sharon Vasquez has a history of HFpEF for which she is on metoprolol 25mg  daily and torsemide 40mg  daily with instructions to increase to 60mg  daily in setting of weight gain. She notes intermittently taking 60mg  per day; however, has not been consistent with this. She notes worsening lower extremity edema over the past month resulting in increased pain in her legs. She reports decreased urination to where she is only producing approximately one-fourth gallon of urine daily. She does note adhering to low sodium diet and fluid restriction.  Patient noted to be 26lbs above dry weight (~360lbs) with bilateral lower extremity edema. Pulmonary exam with faint bibasilar crackles noted.   Plan: BLE wrapped in Unna boots by ortho tech in office BMP today Patient advised to consistently take torsemide 60mg  daily for 4 days  Follow up Friday for re-eval; if persistently volume overload, may need to either increase torsemide further vs admission for IV diuresis    ADDENDUM: BMP with hypokalemia to 3.0, anion gap of 21 with normal bicarb 24. Cr mildly elevated to 1.18 (previously 0.98).  At this time, patient advised for potassium supplement 70mEq bid while diuresing with torsemide 60mg  daily. She reports improved diuresis on increased dosing of torsemide. She reports having potassium supplements of K 67mEq dosing. Advised for 56mEq bid. She is agreeable for this. Will recheck BMP at next visit on 10/21.

## 2021-01-17 NOTE — Patient Instructions (Signed)
Ms Laurin Paulo,  It was a pleasure seeing you in clinic. Today we discussed:   Leg swelling: Unna boots placed. Please take torsemide 60mg  daily until your next appointment on Friday. Labs for renal function checked during this visit. I will call you with any abnormal results.  Breast pain: Mammogram orders placed.   Swallowing difficulty: Referral to GI placed for consideration of repeat EGD.   If you have any questions or concerns, please call our clinic at [phone] between 9am-5pm and after hours call [phone] and ask for the internal medicine resident on call. If you feel you are having a medical emergency please call 911.   Thank you, we look forward to helping you remain healthy!

## 2021-01-17 NOTE — Assessment & Plan Note (Addendum)
Patient has a history of esophageal dismotility with stricture s/p EGD in December 2021 with dilatation. Patient reports approximately one month of dysphagia in which she feels like her food is getting stuck in the epigastric region. She denies any trouble swallowing but does note that she can feel even the grains of food traveling down her esophagus.  She would also like a colonoscopy for colon cancer screening.   Plan: Referred to GI for consideration of repeat endoscopy and colonoscopy  Continue Nexium 40mg  daily

## 2021-01-17 NOTE — Assessment & Plan Note (Signed)
Patient endorses approximately 1-2 weeks of right breast pain that is intermittent in nature. She denies any inciting factors or any trauma to the area. Denies any nipple discharge. On exam, noted to have scarring/discoloration at the 7 o'clock position from prior I&D. No fluctuance, cellulitis, or tenderness appreciated on exam. Noted to have interverted nipples bilaterally without discharge.   Plan: Diagnostic mammogram of R breast

## 2021-01-18 ENCOUNTER — Telehealth: Payer: Self-pay

## 2021-01-18 DIAGNOSIS — J449 Chronic obstructive pulmonary disease, unspecified: Secondary | ICD-10-CM | POA: Diagnosis not present

## 2021-01-18 DIAGNOSIS — I5032 Chronic diastolic (congestive) heart failure: Secondary | ICD-10-CM | POA: Diagnosis not present

## 2021-01-18 DIAGNOSIS — Z7901 Long term (current) use of anticoagulants: Secondary | ICD-10-CM | POA: Diagnosis not present

## 2021-01-18 DIAGNOSIS — G4733 Obstructive sleep apnea (adult) (pediatric): Secondary | ICD-10-CM | POA: Diagnosis not present

## 2021-01-18 DIAGNOSIS — I739 Peripheral vascular disease, unspecified: Secondary | ICD-10-CM | POA: Diagnosis not present

## 2021-01-18 DIAGNOSIS — Z86718 Personal history of other venous thrombosis and embolism: Secondary | ICD-10-CM | POA: Diagnosis not present

## 2021-01-18 DIAGNOSIS — Z86711 Personal history of pulmonary embolism: Secondary | ICD-10-CM | POA: Diagnosis not present

## 2021-01-18 DIAGNOSIS — K219 Gastro-esophageal reflux disease without esophagitis: Secondary | ICD-10-CM | POA: Diagnosis not present

## 2021-01-18 DIAGNOSIS — L97822 Non-pressure chronic ulcer of other part of left lower leg with fat layer exposed: Secondary | ICD-10-CM | POA: Diagnosis not present

## 2021-01-18 DIAGNOSIS — M810 Age-related osteoporosis without current pathological fracture: Secondary | ICD-10-CM | POA: Diagnosis not present

## 2021-01-18 DIAGNOSIS — I110 Hypertensive heart disease with heart failure: Secondary | ICD-10-CM | POA: Diagnosis not present

## 2021-01-18 DIAGNOSIS — I872 Venous insufficiency (chronic) (peripheral): Secondary | ICD-10-CM | POA: Diagnosis not present

## 2021-01-18 DIAGNOSIS — J9611 Chronic respiratory failure with hypoxia: Secondary | ICD-10-CM | POA: Diagnosis not present

## 2021-01-18 DIAGNOSIS — Z87891 Personal history of nicotine dependence: Secondary | ICD-10-CM | POA: Diagnosis not present

## 2021-01-18 DIAGNOSIS — Z9981 Dependence on supplemental oxygen: Secondary | ICD-10-CM | POA: Diagnosis not present

## 2021-01-18 DIAGNOSIS — G8929 Other chronic pain: Secondary | ICD-10-CM | POA: Diagnosis not present

## 2021-01-18 DIAGNOSIS — E785 Hyperlipidemia, unspecified: Secondary | ICD-10-CM | POA: Diagnosis not present

## 2021-01-18 DIAGNOSIS — M170 Bilateral primary osteoarthritis of knee: Secondary | ICD-10-CM | POA: Diagnosis not present

## 2021-01-18 DIAGNOSIS — I4891 Unspecified atrial fibrillation: Secondary | ICD-10-CM | POA: Diagnosis not present

## 2021-01-18 LAB — BMP8+ANION GAP
Anion Gap: 21 mmol/L — ABNORMAL HIGH (ref 10.0–18.0)
BUN/Creatinine Ratio: 9 — ABNORMAL LOW (ref 12–28)
BUN: 11 mg/dL (ref 8–27)
CO2: 24 mmol/L (ref 20–29)
Calcium: 8.6 mg/dL — ABNORMAL LOW (ref 8.7–10.3)
Chloride: 97 mmol/L (ref 96–106)
Creatinine, Ser: 1.18 mg/dL — ABNORMAL HIGH (ref 0.57–1.00)
Glucose: 145 mg/dL — ABNORMAL HIGH (ref 70–99)
Potassium: 3 mmol/L — ABNORMAL LOW (ref 3.5–5.2)
Sodium: 142 mmol/L (ref 134–144)
eGFR: 49 mL/min/{1.73_m2} — ABNORMAL LOW (ref >59)

## 2021-01-18 NOTE — Telephone Encounter (Signed)
Received TC from Mickel Baas with [facility] requesting a VO for a nursing evaluation. States she has had a weight gain and they want to evaluate her for heart failure s/s. VO given. Will route to blue team for agreement or denial Thank you, SChaplin, RN,BSN

## 2021-01-18 NOTE — Telephone Encounter (Signed)
I agree

## 2021-01-19 NOTE — Progress Notes (Signed)
Internal Medicine Clinic Attending ° °Case discussed with Dr. Aslam  At the time of the visit.  We reviewed the resident’s history and exam and pertinent patient test results.  I agree with the assessment, diagnosis, and plan of care documented in the resident’s note.  °

## 2021-01-21 ENCOUNTER — Ambulatory Visit (INDEPENDENT_AMBULATORY_CARE_PROVIDER_SITE_OTHER): Payer: Medicare Other | Admitting: Internal Medicine

## 2021-01-21 ENCOUNTER — Other Ambulatory Visit: Payer: Self-pay

## 2021-01-21 VITALS — BP 125/82 | HR 101 | Temp 97.7°F | Ht 74.0 in | Wt 382.9 lb

## 2021-01-21 DIAGNOSIS — I5032 Chronic diastolic (congestive) heart failure: Secondary | ICD-10-CM

## 2021-01-21 NOTE — Progress Notes (Signed)
   CC: heart failure follow up  HPI:  [name] is a 70 y.o. female with PMHx as stated below presenting for follow up of her HFpEF/bilateral lower extremity swelling. At prior visit, patient noted to have worsening bilateral lower extremity swelling for which Unna boots placed and her torsemide was increased. Patient reports some improvement in swelling; however, notes that she has not been urinating much. Reports feeling more sluggish. Denies any further concerns at this time. Please see problem based charting for complete assessment and plan.  Past Medical History:  Diagnosis Date   A-fib [facility])    Anxiety    Arthritis    "qwhre; joints, back" (04/17/2017)   Benign breast cyst in female, left 01/08/2017   Found by Screening mammogram, evaluated by U/S on 01/08/17 and determined to be a benign simple breast cyst.   Cellulitis of left lower leg 05/30/2017   CHF (congestive heart failure) (HCC)    Chronic low back pain 08/21/2016   Chronic lower back pain    Chronic venous insufficiency    /notes 05/30/2017   COPD (chronic obstructive pulmonary disease) (HCC)    Depression    DVT (deep venous thrombosis) ([place]) 11/16/2016   GERD (gastroesophageal reflux disease)    Headache    "weekly for the last 3 months" (04/17/2017)   Hyperlipidemia    Hypertension    Morbid obesity ([place])    PE (pulmonary embolism)    Pulmonary embolism ([place]) 09/21/2014   Sleep apnea    Review of Systems:  Negative except as stated in HPI.  Physical Exam:  Vitals:   01/21/21 0940  BP: 125/82  Pulse: (!) 101  Temp: 97.7 F (36.5 C)  TempSrc: Oral  SpO2: 91%  Weight: (!) 382 lb 14.4 oz (173.7 kg)  Height: 6\' 2"  (1.88 m)   Physical Exam  Constitutional: Chronically ill appearing elderly female, No distress.  HENT: Normocephalic and atraumatic, moist mucous membranes Cardiovascular: Normal rate, regular rhythm, S1 and S2 present, no murmurs, rubs, gallops.  Distal pulses intact Respiratory: No  respiratory distress, no accessory muscle use. Lungs with faint bibasilar crackles; on baseline 2L O2 Musculoskeletal: Normal bulk and tone.  BLE wrapped in Unna boots Neurological: Is alert and oriented x4, no apparent focal deficits noted. Skin: Warm and dry.  No rash, erythema, lesions noted. Psychiatric: Normal mood and affect. Behavior is normal. Judgment and thought content normal.   Assessment & Plan:   See Encounters Tab for problem based charting.  Patient discussed with Dr. Heber [place]

## 2021-01-21 NOTE — Assessment & Plan Note (Signed)
Ms Braid is following up today for her bilateral lower extremity swelling. She was evaluated on 10/18 for worsening BLE edema. Unna boots placed during OV and patient advised to increase her torsemide to 60mg  daily. She was also advised to increase potassium to 62mEq bid. Patient reports that she has been taking her torsemide as prescribed. She notes not urinating as much and feels "sluggish". She is down 4lbs from prior visit. BLE wrapped in Unna boots without significant edema noted. She has trace bibasilar crackles but is not requiring increasing amounts of oxygen at this time.  Plan: BMP today Resume prior dosing of torsemide 40mg  daily; up to 60mg  daily for BLE edema and weight gain  Continue Unna boots - home RN to remove in 1 week

## 2021-01-21 NOTE — Patient Instructions (Addendum)
Sharon Vasquez,  It was a pleasure seeing you in clinic. Today we discussed:   Leg swelling:  At this time, this seems to be improving. You may continue with torsemide 40mg  daily. I we are checking on lab work and I will call you with any abnormal results.   Trouble swallowing: I will follow up on the referral to GI at the last visit  If you have any questions or concerns, please call our clinic at [phone] between 9am-5pm and after hours call [phone] and ask for the internal medicine resident on call. If you feel you are having a medical emergency please call 911.   Thank you, we look forward to helping you remain healthy!

## 2021-01-22 LAB — BMP8+ANION GAP
Anion Gap: 18 mmol/L (ref 10.0–18.0)
BUN/Creatinine Ratio: 14 (ref 12–28)
BUN: 12 mg/dL (ref 8–27)
CO2: 27 mmol/L (ref 20–29)
Calcium: 9.3 mg/dL (ref 8.7–10.3)
Chloride: 97 mmol/L (ref 96–106)
Creatinine, Ser: 0.84 mg/dL (ref 0.57–1.00)
Glucose: 132 mg/dL — ABNORMAL HIGH (ref 70–99)
Potassium: 2.9 mmol/L — ABNORMAL LOW (ref 3.5–5.2)
Sodium: 142 mmol/L (ref 134–144)
eGFR: 74 mL/min/{1.73_m2} (ref >59)

## 2021-01-24 ENCOUNTER — Other Ambulatory Visit: Payer: Self-pay | Admitting: Internal Medicine

## 2021-01-24 DIAGNOSIS — I5032 Chronic diastolic (congestive) heart failure: Secondary | ICD-10-CM

## 2021-01-25 ENCOUNTER — Telehealth: Payer: Self-pay | Admitting: *Deleted

## 2021-01-25 ENCOUNTER — Other Ambulatory Visit: Payer: Self-pay | Admitting: Internal Medicine

## 2021-01-25 ENCOUNTER — Telehealth: Payer: Self-pay

## 2021-01-25 DIAGNOSIS — Z87891 Personal history of nicotine dependence: Secondary | ICD-10-CM | POA: Diagnosis not present

## 2021-01-25 DIAGNOSIS — K219 Gastro-esophageal reflux disease without esophagitis: Secondary | ICD-10-CM | POA: Diagnosis not present

## 2021-01-25 DIAGNOSIS — I4891 Unspecified atrial fibrillation: Secondary | ICD-10-CM | POA: Diagnosis not present

## 2021-01-25 DIAGNOSIS — G8929 Other chronic pain: Secondary | ICD-10-CM | POA: Diagnosis not present

## 2021-01-25 DIAGNOSIS — I872 Venous insufficiency (chronic) (peripheral): Secondary | ICD-10-CM | POA: Diagnosis not present

## 2021-01-25 DIAGNOSIS — M810 Age-related osteoporosis without current pathological fracture: Secondary | ICD-10-CM | POA: Diagnosis not present

## 2021-01-25 DIAGNOSIS — Z9981 Dependence on supplemental oxygen: Secondary | ICD-10-CM | POA: Diagnosis not present

## 2021-01-25 DIAGNOSIS — N644 Mastodynia: Secondary | ICD-10-CM

## 2021-01-25 DIAGNOSIS — E785 Hyperlipidemia, unspecified: Secondary | ICD-10-CM | POA: Diagnosis not present

## 2021-01-25 DIAGNOSIS — I110 Hypertensive heart disease with heart failure: Secondary | ICD-10-CM | POA: Diagnosis not present

## 2021-01-25 DIAGNOSIS — J9611 Chronic respiratory failure with hypoxia: Secondary | ICD-10-CM | POA: Diagnosis not present

## 2021-01-25 DIAGNOSIS — G4733 Obstructive sleep apnea (adult) (pediatric): Secondary | ICD-10-CM | POA: Diagnosis not present

## 2021-01-25 DIAGNOSIS — I5032 Chronic diastolic (congestive) heart failure: Secondary | ICD-10-CM | POA: Diagnosis not present

## 2021-01-25 DIAGNOSIS — M170 Bilateral primary osteoarthritis of knee: Secondary | ICD-10-CM | POA: Diagnosis not present

## 2021-01-25 DIAGNOSIS — I739 Peripheral vascular disease, unspecified: Secondary | ICD-10-CM | POA: Diagnosis not present

## 2021-01-25 DIAGNOSIS — Z7901 Long term (current) use of anticoagulants: Secondary | ICD-10-CM | POA: Diagnosis not present

## 2021-01-25 DIAGNOSIS — J449 Chronic obstructive pulmonary disease, unspecified: Secondary | ICD-10-CM | POA: Diagnosis not present

## 2021-01-25 DIAGNOSIS — Z86718 Personal history of other venous thrombosis and embolism: Secondary | ICD-10-CM | POA: Diagnosis not present

## 2021-01-25 DIAGNOSIS — L97822 Non-pressure chronic ulcer of other part of left lower leg with fat layer exposed: Secondary | ICD-10-CM | POA: Diagnosis not present

## 2021-01-25 DIAGNOSIS — Z86711 Personal history of pulmonary embolism: Secondary | ICD-10-CM | POA: Diagnosis not present

## 2021-01-25 NOTE — Telephone Encounter (Signed)
Unna boots can be left off and will follow up on next visit

## 2021-01-25 NOTE — Telephone Encounter (Signed)
Rn ( Irianna webb ) is in pt home pt is asking her to remove her dressing on her legs .  Rn is wanting orders or clarification  [phone]

## 2021-01-25 NOTE — Progress Notes (Signed)
Internal Medicine Clinic Attending ° °Case discussed with Dr. Aslam  At the time of the visit.  We reviewed the resident’s history and exam and pertinent patient test results.  I agree with the assessment, diagnosis, and plan of care documented in the resident’s note.  °

## 2021-01-25 NOTE — Telephone Encounter (Signed)
Rory Percy RN called and informed unna boots may remain off per Dr Marva Panda. She stated she will see pt 3 times a week x 3 weeks; for f/u also see if pt is compliant with not smoking ,wearing her oxygen and taking her medications. Also she will ask pt to call and schedule her next appt.

## 2021-01-25 NOTE — Telephone Encounter (Signed)
Call from Michele Rockers, [facility] nurse. Stated pt wants unna boots off b/c of discomfort - so they were taken off. The nurse stated her legs look "good"; no open areas. Also pt's weight today is 384 lbs; she's taking Torsemide; pt stated she's not voiding no more than usual. The nurse wants to know if unna boots can be left off or need to be replaced?  Thanks

## 2021-01-27 ENCOUNTER — Other Ambulatory Visit: Payer: Self-pay | Admitting: Student

## 2021-01-27 DIAGNOSIS — I739 Peripheral vascular disease, unspecified: Secondary | ICD-10-CM

## 2021-01-27 DIAGNOSIS — I480 Paroxysmal atrial fibrillation: Secondary | ICD-10-CM

## 2021-01-27 NOTE — Addendum Note (Signed)
Addended by: Truddie Crumble on: 01/27/2021 02:39 PM   Modules accepted: Orders

## 2021-01-28 ENCOUNTER — Other Ambulatory Visit: Payer: Self-pay

## 2021-01-28 ENCOUNTER — Telehealth: Payer: Self-pay

## 2021-01-28 ENCOUNTER — Other Ambulatory Visit (INDEPENDENT_AMBULATORY_CARE_PROVIDER_SITE_OTHER): Payer: Medicare Other

## 2021-01-28 ENCOUNTER — Other Ambulatory Visit: Payer: Self-pay | Admitting: Internal Medicine

## 2021-01-28 ENCOUNTER — Ambulatory Visit (INDEPENDENT_AMBULATORY_CARE_PROVIDER_SITE_OTHER): Payer: Medicare Other | Admitting: Internal Medicine

## 2021-01-28 VITALS — BP 123/79 | HR 89 | Ht 74.0 in | Wt 385.6 lb

## 2021-01-28 DIAGNOSIS — I4891 Unspecified atrial fibrillation: Secondary | ICD-10-CM | POA: Diagnosis not present

## 2021-01-28 DIAGNOSIS — J449 Chronic obstructive pulmonary disease, unspecified: Secondary | ICD-10-CM | POA: Diagnosis not present

## 2021-01-28 DIAGNOSIS — Z9981 Dependence on supplemental oxygen: Secondary | ICD-10-CM | POA: Diagnosis not present

## 2021-01-28 DIAGNOSIS — M810 Age-related osteoporosis without current pathological fracture: Secondary | ICD-10-CM | POA: Diagnosis not present

## 2021-01-28 DIAGNOSIS — J9611 Chronic respiratory failure with hypoxia: Secondary | ICD-10-CM

## 2021-01-28 DIAGNOSIS — I5032 Chronic diastolic (congestive) heart failure: Secondary | ICD-10-CM

## 2021-01-28 DIAGNOSIS — Z86718 Personal history of other venous thrombosis and embolism: Secondary | ICD-10-CM | POA: Diagnosis not present

## 2021-01-28 DIAGNOSIS — G4733 Obstructive sleep apnea (adult) (pediatric): Secondary | ICD-10-CM | POA: Diagnosis not present

## 2021-01-28 DIAGNOSIS — M170 Bilateral primary osteoarthritis of knee: Secondary | ICD-10-CM | POA: Diagnosis not present

## 2021-01-28 DIAGNOSIS — Z86711 Personal history of pulmonary embolism: Secondary | ICD-10-CM | POA: Diagnosis not present

## 2021-01-28 DIAGNOSIS — Z7901 Long term (current) use of anticoagulants: Secondary | ICD-10-CM | POA: Diagnosis not present

## 2021-01-28 DIAGNOSIS — K219 Gastro-esophageal reflux disease without esophagitis: Secondary | ICD-10-CM | POA: Diagnosis not present

## 2021-01-28 DIAGNOSIS — G8929 Other chronic pain: Secondary | ICD-10-CM | POA: Diagnosis not present

## 2021-01-28 DIAGNOSIS — I110 Hypertensive heart disease with heart failure: Secondary | ICD-10-CM | POA: Diagnosis not present

## 2021-01-28 DIAGNOSIS — I739 Peripheral vascular disease, unspecified: Secondary | ICD-10-CM | POA: Diagnosis not present

## 2021-01-28 DIAGNOSIS — I872 Venous insufficiency (chronic) (peripheral): Secondary | ICD-10-CM | POA: Diagnosis not present

## 2021-01-28 DIAGNOSIS — L97822 Non-pressure chronic ulcer of other part of left lower leg with fat layer exposed: Secondary | ICD-10-CM | POA: Diagnosis not present

## 2021-01-28 DIAGNOSIS — Z87891 Personal history of nicotine dependence: Secondary | ICD-10-CM | POA: Diagnosis not present

## 2021-01-28 DIAGNOSIS — E785 Hyperlipidemia, unspecified: Secondary | ICD-10-CM | POA: Diagnosis not present

## 2021-01-28 MED ORDER — METOLAZONE 2.5 MG PO TABS: 2.5000 mg | ORAL_TABLET | Freq: Every day | ORAL | 0 refills | Status: DC

## 2021-01-28 NOTE — Progress Notes (Signed)
70 yo independently living Ms. Fort has been receiving home nursing care to monitor severe LE edema complicated by loss of skin integrity and venous stasis wounds, treated with Unna wraps.  She has been experiencing increasing edema to the point where the wraps were becoming very uncomfortable and she removed them.  Home RN arrived concerned and patient comes to [facility] for evaluation.  Worked into overflow schedule.  SHe is not thriving - weight and heavy edematous legs make it nearly impossible for her to get out of bed, and she can't move beyond the [facility]; performs pivot transfers.  Unable to do ADLs.  Asks if she can receive additional help in the home.  Needs an aide but has the understanding that she doesn't qualify; CCM is reportedly looking into this.  Uses electric chair.  Has an old electric bed which doesn't have option to elevate legs; it is not sized appropriately for her BMI.  Watches dietary salt carefully, adherent to med regimen, uses CPAP nightly.  Breathing has been worse at times; if she raises HOB, it folds her middle and makes it harder to breath.  Has PureWick, can monitor urine output.  Hasn't been as much as usual.   Has been taking torsemide 40 mg bid, increasing to 60 bid didn't help, went back to 40 bid.       Patient Active Problem List   Diagnosis Date Noted   Breast pain, right 01/17/2021   Chest pain 10/08/2020   Chronic heart failure with preserved ejection fraction (HFpEF) (HCC)    Mixed simple and mucopurulent chronic bronchitis (HCC)    Paroxysmal atrial fibrillation ([place])    Osteoarthritis of left hip 10/07/2020   Chronic venous hypertension w ulcer of l low extrem ([place]) 10/07/2020   Venous stasis ulcer of left lower extremity ([place]) 08/18/2020   Hyperlipidemia 05/14/2020   Hypertensive urgency 04/10/2020   Esophageal dysmotility 11/10/2019   Chronic respiratory failure with hypoxia ([place]) 09/30/2019   Mixed incontinence, urge and stress (female) (female) 08/21/2019    Overgrown toenails 07/01/2019   Preventative health care 06/04/2019   GERD (gastroesophageal reflux disease) 01/20/2019   Coronary artery calcification seen on CT scan 04/12/2018   ILD (interstitial lung disease) ([place]) 02/13/2018   Pain and numbness of right upper extremity 02/08/2018   Long term current use of amiodarone 12/19/2017   OSA (obstructive sleep apnea) 11/14/2017   Chronic pain syndrome 07/27/2017   Major depression, recurrent, chronic ([place]) 02/01/2017   Osteoporosis 12/14/2016   Drug induced constipation 08/21/2016   Aortic atherosclerosis ([place]) 07/17/2016   Chronic venous insufficiency 07/12/2016   Chronic anticoagulation 07/12/2016   Tobacco use 07/11/2016   Acute exacerbation of chronic obstructive pulmonary disease (COPD) (HCC)    Diastolic heart failure (HCC)    History of pulmonary embolism    Atrial fibrillation with controlled ventricular response (HCC)    Vocal cord polyp 12/18/2015   Laryngopharyngeal reflux 12/18/2015   Morbid obesity ([place])    Peripheral vascular disease ([place])    Benign essential HTN    Primary osteoarthritis of both knees 09/16/2014     BP 123/79 (BP Location: Left Wrist)   Pulse 89   Ht 6\' 2"  (1.88 m)   Wt (!) 385 lb 9.6 oz (174.9 kg)   SpO2 96% Comment: on 2L   BMI 49.51 kg/m   Awake and alert, speaks in full sentences w/o increased WOB.  Legs are grossly edematous with taught bumpy skin, areas of dried transudate which have dripped  down legs.  No wounds.  Feet are dusky, edematous, cool.  No palpable pulses.   Assessment and plan: Poorly controlled hypervolemia in setting of morbid obesity, compounded by inability to elevate legs.  Will add zaroxalyn to current dose of torsemide, inquire about ordering a bariatric electric bed, and send mssg to CCM (already established with them) regarding need for extra help in the home.  Ms. Noori will return next week for recheck and determination of diuretic dosing.  I spoke to [facility] by phone to  request Unna wraps be replaced.  THis may occur either today or tomorrow.       Needs mammogram changed from screening to diagnostic.

## 2021-01-28 NOTE — Patient Instructions (Addendum)
Sharon Vasquez,  I'm glad you were able to come in today about your swelling.  I want you to do the following:  1)  continue your Torsemide 40 mg daily. 2)  add new medicine called zaroxolyn/metolazone  2.5 mg every day for the next 5 days.  And no more until you can be evaluated again!  3)  I will ask the [facility] nurse to reapply your Unna boots.    4)  Please return next week around Wed-Thursday for a recheck.  Take care and stay well,  Dr. Jimmye Norman

## 2021-01-28 NOTE — Telephone Encounter (Signed)
TC to Rory Percy, RN with [facility] and she was notified that Dr. Jimmye Norman would like unna boots reapplied today or tomorrow, she verbalized understanding.  RN informed Aminah that RN will now call [facility] to have this scheduled.  TC to [facility] at [phone], spoke with [facility], she was also informed that Dr. Jimmye Norman would like Unna boots reapplied.  She states she will resume previous orders and someone will be out there possible today, but it would be before Sunday due to weekend availability. SChaplin, RN,BSN

## 2021-01-28 NOTE — Telephone Encounter (Signed)
Patient presents for appt for labs only.  She c/o bilateral leg pain and swelling.  Unna boots removed this week via [facility] nurse.  Pt weight obtained, 385.6 (382 on 10/21 visit).  RN consulted with Dr. Jimmye Norman, attending MD, pt added to her schedule.   SChaplin, RN,BSN

## 2021-01-29 LAB — BMP8+ANION GAP
Anion Gap: 17 mmol/L (ref 10.0–18.0)
BUN/Creatinine Ratio: 11 — ABNORMAL LOW (ref 12–28)
BUN: 10 mg/dL (ref 8–27)
CO2: 26 mmol/L (ref 20–29)
Calcium: 9.1 mg/dL (ref 8.7–10.3)
Chloride: 99 mmol/L (ref 96–106)
Creatinine, Ser: 0.9 mg/dL (ref 0.57–1.00)
Glucose: 157 mg/dL — ABNORMAL HIGH (ref 70–99)
Potassium: 3.3 mmol/L — ABNORMAL LOW (ref 3.5–5.2)
Sodium: 142 mmol/L (ref 134–144)
eGFR: 68 mL/min/{1.73_m2} (ref >59)

## 2021-01-31 ENCOUNTER — Ambulatory Visit: Payer: Self-pay

## 2021-01-31 DIAGNOSIS — M810 Age-related osteoporosis without current pathological fracture: Secondary | ICD-10-CM | POA: Diagnosis not present

## 2021-01-31 DIAGNOSIS — K219 Gastro-esophageal reflux disease without esophagitis: Secondary | ICD-10-CM | POA: Diagnosis not present

## 2021-01-31 DIAGNOSIS — I4891 Unspecified atrial fibrillation: Secondary | ICD-10-CM | POA: Diagnosis not present

## 2021-01-31 DIAGNOSIS — G4733 Obstructive sleep apnea (adult) (pediatric): Secondary | ICD-10-CM | POA: Diagnosis not present

## 2021-01-31 DIAGNOSIS — L97822 Non-pressure chronic ulcer of other part of left lower leg with fat layer exposed: Secondary | ICD-10-CM | POA: Diagnosis not present

## 2021-01-31 DIAGNOSIS — Z86718 Personal history of other venous thrombosis and embolism: Secondary | ICD-10-CM | POA: Diagnosis not present

## 2021-01-31 DIAGNOSIS — I5032 Chronic diastolic (congestive) heart failure: Secondary | ICD-10-CM | POA: Diagnosis not present

## 2021-01-31 DIAGNOSIS — G8929 Other chronic pain: Secondary | ICD-10-CM | POA: Diagnosis not present

## 2021-01-31 DIAGNOSIS — I739 Peripheral vascular disease, unspecified: Secondary | ICD-10-CM | POA: Diagnosis not present

## 2021-01-31 DIAGNOSIS — E785 Hyperlipidemia, unspecified: Secondary | ICD-10-CM | POA: Diagnosis not present

## 2021-01-31 DIAGNOSIS — J449 Chronic obstructive pulmonary disease, unspecified: Secondary | ICD-10-CM | POA: Diagnosis not present

## 2021-01-31 DIAGNOSIS — Z7901 Long term (current) use of anticoagulants: Secondary | ICD-10-CM | POA: Diagnosis not present

## 2021-01-31 DIAGNOSIS — I872 Venous insufficiency (chronic) (peripheral): Secondary | ICD-10-CM | POA: Diagnosis not present

## 2021-01-31 DIAGNOSIS — Z9981 Dependence on supplemental oxygen: Secondary | ICD-10-CM | POA: Diagnosis not present

## 2021-01-31 DIAGNOSIS — Z87891 Personal history of nicotine dependence: Secondary | ICD-10-CM | POA: Diagnosis not present

## 2021-01-31 DIAGNOSIS — I110 Hypertensive heart disease with heart failure: Secondary | ICD-10-CM | POA: Diagnosis not present

## 2021-01-31 DIAGNOSIS — M170 Bilateral primary osteoarthritis of knee: Secondary | ICD-10-CM | POA: Diagnosis not present

## 2021-01-31 DIAGNOSIS — J9611 Chronic respiratory failure with hypoxia: Secondary | ICD-10-CM | POA: Diagnosis not present

## 2021-01-31 DIAGNOSIS — Z86711 Personal history of pulmonary embolism: Secondary | ICD-10-CM | POA: Diagnosis not present

## 2021-01-31 NOTE — Chronic Care Management (AMB) (Signed)
  Chronic Care Management   Note  01/31/2021 Name: Sharon Vasquez [name]: [id number] DOB: 11-09-1950  Follow up plan: Received request from Dr. Jimmye Norman after patient visit on 01/28/21 that patient needs bariatric hospital bed as her current hospital bed is not working correctly and it is important for her to have a bed that she can elevate her legs.  Collaborated with Dr. Jimmye Norman and obtained order for the bed, reached out to the patient and message sent to reps at [facility], Stephannie Peters, Nash Shearer and Leory Plowman new) to please contact the patient for further information and or to contact this [facility] for further needs to obtain bed.  Johnney Killian, RN, BSN, CCM Care Management Coordinator [facility] Phone: [phone]: [phone]

## 2021-02-01 ENCOUNTER — Ambulatory Visit: Payer: Self-pay

## 2021-02-01 NOTE — Chronic Care Management (AMB) (Signed)
  Chronic Care Management   Note  02/01/2021 Name: Sharon Vasquez [name]: [id number] DOB: [birth year]    Follow up plan: Spoke with patient this morning.  She informed me that the current hospital bed she has was given to her by her son in law. Patient notes that she has been contacted by [employer] and she will be getting her new bed in a few days.  She does state that she has to pay $26.00 month for the bed but she feels she will benefit tremendously by having a bed she can elevate her legs and reposition.  Patient provided this [facility] phone number if she has any problems with the DME process.  Johnney Killian, RN, BSN, CCM Care Management Coordinator [facility] Internal Medicine Phone: [phone]: [phone]

## 2021-02-02 ENCOUNTER — Ambulatory Visit (INDEPENDENT_AMBULATORY_CARE_PROVIDER_SITE_OTHER): Payer: Medicare Other | Admitting: Student in an Organized Health Care Education/Training Program

## 2021-02-02 ENCOUNTER — Encounter: Payer: Self-pay | Admitting: Student

## 2021-02-02 ENCOUNTER — Ambulatory Visit: Payer: Self-pay

## 2021-02-02 VITALS — BP 124/90 | HR 83 | Temp 98.4°F | Wt 381.9 lb

## 2021-02-02 DIAGNOSIS — I5032 Chronic diastolic (congestive) heart failure: Secondary | ICD-10-CM | POA: Diagnosis not present

## 2021-02-02 MED ORDER — METOLAZONE 2.5 MG PO TABS: 2.5000 mg | ORAL_TABLET | ORAL | 1 refills | Status: DC

## 2021-02-02 NOTE — Chronic Care Management (AMB) (Signed)
  Chronic Care Management   Note  02/02/2021 Name: Sharon Vasquez [name]: [id number] DOB: 1950-09-06    Follow up plan: Received call from patient who informed me that she had spoken with her representative from [employer] and she is eligible to get help in her home through her Medicare Advantage plan.   Patient stated she was denied in home assistance through [facility] and she feels she really needs assistance.   Patient gave the [employer] representative my phone number and gave permission for this RNCM to speak with them if they reach out to me.  Johnney Killian, RN, BSN, CCM Care Management Coordinator [facility] Internal Medicine Phone: [phone]: [phone]

## 2021-02-02 NOTE — Progress Notes (Signed)
   Assessment and Plan:  See Encounters tab for problem-based medical decision making.   __________________________________________________________  HPI:   70 year old person with severe obesity and right heart failure, with many other comorbidities, here for a focused follow up of her volume status after starting metolazone last week. Patient had to leave our visit a little early due to transportation issue, but we were at least able to assess this problem. She reports doing well with that medication, no side effects. Says she is peeing well. Has noticed some weight loss at home and feels that her legs are less swollen. Feels her breathing is a little better, still chronically disabled needing help with ADLs.  __________________________________________________________  Problem List: Patient Active Problem List   Diagnosis Date Noted   Breast pain, right 01/17/2021   Chest pain 10/08/2020   Chronic heart failure with preserved ejection fraction (HFpEF) (HCC)    Mixed simple and mucopurulent chronic bronchitis (HCC)    Paroxysmal atrial fibrillation ([place])    Osteoarthritis of left hip 10/07/2020   Chronic venous hypertension w ulcer of l low extrem ([place]) 10/07/2020   Venous stasis ulcer of left lower extremity ([place]) 08/18/2020   Hyperlipidemia 05/14/2020   Hypertensive urgency 04/10/2020   Esophageal dysmotility 11/10/2019   Chronic respiratory failure with hypoxia ([place]) 09/30/2019   Mixed incontinence, urge and stress (female) (female) 08/21/2019   Overgrown toenails 07/01/2019   Preventative health care 06/04/2019   GERD (gastroesophageal reflux disease) 01/20/2019   Coronary artery calcification seen on CT scan 04/12/2018   ILD (interstitial lung disease) ([place]) 02/13/2018   Pain and numbness of right upper extremity 02/08/2018   Long term current use of amiodarone 12/19/2017   OSA (obstructive sleep apnea) 11/14/2017   Chronic pain syndrome 07/27/2017   Major depression,  recurrent, chronic ([place]) 02/01/2017   Osteoporosis 12/14/2016   Drug induced constipation 08/21/2016   Aortic atherosclerosis ([place]) 07/17/2016   Chronic venous insufficiency 07/12/2016   Chronic anticoagulation 07/12/2016   Tobacco use 07/11/2016   Acute exacerbation of chronic obstructive pulmonary disease (COPD) (HCC)    Diastolic heart failure ([place])    History of pulmonary embolism    Atrial fibrillation with controlled ventricular response (HCC)    Vocal cord polyp 12/18/2015   Laryngopharyngeal reflux 12/18/2015   Morbid obesity ([place])    Peripheral vascular disease ([place])    Benign essential HTN    Primary osteoarthritis of both knees 09/16/2014    Medications: Reconciled today in Epic __________________________________________________________  Physical Exam:  Vital Signs: Vitals:   02/02/21 1029  BP: 124/90  Pulse: 83  Temp: 98.4 F (36.9 C)  TempSrc: Oral  SpO2: 97%  Weight: (!) 381 lb 14.4 oz (173.2 kg)    Gen: Well appearing, NAD Ext: Warm, 1+ chronic non-pitting LE edema bilaterally, not tight Skin: chronic stasis changes, compression socks in place Neuro: alert, conversational, in electric wheelchair but full strength in upper and lower extremities Psych: not depressed or anxious appearing

## 2021-02-02 NOTE — Addendum Note (Signed)
Addended by: Lalla Brothers T on: 02/02/2021 10:59 AM   Modules accepted: Orders

## 2021-02-02 NOTE — Assessment & Plan Note (Signed)
Started metolazone last week in addition to torsemide and has responded well. Down 4 pounds, less LE edema, and she feels better. Will check a BMP today. Plan to try every other day dosing of metolazone 2.5mg  for now as she still looks overall volume overloaded. Follow up in one month. Patient advised to check daily weights and monitor for more edema.

## 2021-02-03 DIAGNOSIS — I110 Hypertensive heart disease with heart failure: Secondary | ICD-10-CM | POA: Diagnosis not present

## 2021-02-03 DIAGNOSIS — I872 Venous insufficiency (chronic) (peripheral): Secondary | ICD-10-CM | POA: Diagnosis not present

## 2021-02-03 DIAGNOSIS — M810 Age-related osteoporosis without current pathological fracture: Secondary | ICD-10-CM | POA: Diagnosis not present

## 2021-02-03 DIAGNOSIS — G4733 Obstructive sleep apnea (adult) (pediatric): Secondary | ICD-10-CM | POA: Diagnosis not present

## 2021-02-03 DIAGNOSIS — Z86711 Personal history of pulmonary embolism: Secondary | ICD-10-CM | POA: Diagnosis not present

## 2021-02-03 DIAGNOSIS — I5032 Chronic diastolic (congestive) heart failure: Secondary | ICD-10-CM | POA: Diagnosis not present

## 2021-02-03 DIAGNOSIS — K219 Gastro-esophageal reflux disease without esophagitis: Secondary | ICD-10-CM | POA: Diagnosis not present

## 2021-02-03 DIAGNOSIS — Z7901 Long term (current) use of anticoagulants: Secondary | ICD-10-CM | POA: Diagnosis not present

## 2021-02-03 DIAGNOSIS — J449 Chronic obstructive pulmonary disease, unspecified: Secondary | ICD-10-CM | POA: Diagnosis not present

## 2021-02-03 DIAGNOSIS — Z9981 Dependence on supplemental oxygen: Secondary | ICD-10-CM | POA: Diagnosis not present

## 2021-02-03 DIAGNOSIS — I4891 Unspecified atrial fibrillation: Secondary | ICD-10-CM | POA: Diagnosis not present

## 2021-02-03 DIAGNOSIS — Z86718 Personal history of other venous thrombosis and embolism: Secondary | ICD-10-CM | POA: Diagnosis not present

## 2021-02-03 DIAGNOSIS — J9611 Chronic respiratory failure with hypoxia: Secondary | ICD-10-CM | POA: Diagnosis not present

## 2021-02-03 DIAGNOSIS — M170 Bilateral primary osteoarthritis of knee: Secondary | ICD-10-CM | POA: Diagnosis not present

## 2021-02-03 DIAGNOSIS — G8929 Other chronic pain: Secondary | ICD-10-CM | POA: Diagnosis not present

## 2021-02-03 DIAGNOSIS — Z87891 Personal history of nicotine dependence: Secondary | ICD-10-CM | POA: Diagnosis not present

## 2021-02-03 DIAGNOSIS — I739 Peripheral vascular disease, unspecified: Secondary | ICD-10-CM | POA: Diagnosis not present

## 2021-02-03 DIAGNOSIS — E785 Hyperlipidemia, unspecified: Secondary | ICD-10-CM | POA: Diagnosis not present

## 2021-02-03 LAB — BMP8+ANION GAP
Anion Gap: 18 mmol/L (ref 10.0–18.0)
BUN/Creatinine Ratio: 12 (ref 12–28)
BUN: 11 mg/dL (ref 8–27)
CO2: 25 mmol/L (ref 20–29)
Calcium: 9.2 mg/dL (ref 8.7–10.3)
Chloride: 98 mmol/L (ref 96–106)
Creatinine, Ser: 0.93 mg/dL (ref 0.57–1.00)
Glucose: 151 mg/dL — ABNORMAL HIGH (ref 70–99)
Potassium: 3.2 mmol/L — ABNORMAL LOW (ref 3.5–5.2)
Sodium: 141 mmol/L (ref 134–144)
eGFR: 66 mL/min/{1.73_m2} (ref >59)

## 2021-02-04 DIAGNOSIS — F17210 Nicotine dependence, cigarettes, uncomplicated: Secondary | ICD-10-CM | POA: Diagnosis not present

## 2021-02-04 DIAGNOSIS — M170 Bilateral primary osteoarthritis of knee: Secondary | ICD-10-CM | POA: Diagnosis not present

## 2021-02-04 DIAGNOSIS — Z79899 Other long term (current) drug therapy: Secondary | ICD-10-CM | POA: Diagnosis not present

## 2021-02-07 DIAGNOSIS — G4733 Obstructive sleep apnea (adult) (pediatric): Secondary | ICD-10-CM | POA: Diagnosis not present

## 2021-02-08 DIAGNOSIS — Z79899 Other long term (current) drug therapy: Secondary | ICD-10-CM | POA: Diagnosis not present

## 2021-02-09 ENCOUNTER — Telehealth: Payer: Self-pay | Admitting: *Deleted

## 2021-02-09 ENCOUNTER — Telehealth: Payer: Self-pay

## 2021-02-09 ENCOUNTER — Ambulatory Visit: Payer: Medicare Other | Admitting: Licensed Clinical Social Worker

## 2021-02-09 ENCOUNTER — Telehealth: Payer: Self-pay | Admitting: Licensed Clinical Social Worker

## 2021-02-09 DIAGNOSIS — I110 Hypertensive heart disease with heart failure: Secondary | ICD-10-CM | POA: Diagnosis not present

## 2021-02-09 DIAGNOSIS — Z86718 Personal history of other venous thrombosis and embolism: Secondary | ICD-10-CM | POA: Diagnosis not present

## 2021-02-09 DIAGNOSIS — J449 Chronic obstructive pulmonary disease, unspecified: Secondary | ICD-10-CM | POA: Diagnosis not present

## 2021-02-09 DIAGNOSIS — G4733 Obstructive sleep apnea (adult) (pediatric): Secondary | ICD-10-CM | POA: Diagnosis not present

## 2021-02-09 DIAGNOSIS — M810 Age-related osteoporosis without current pathological fracture: Secondary | ICD-10-CM | POA: Diagnosis not present

## 2021-02-09 DIAGNOSIS — E785 Hyperlipidemia, unspecified: Secondary | ICD-10-CM | POA: Diagnosis not present

## 2021-02-09 DIAGNOSIS — Z9981 Dependence on supplemental oxygen: Secondary | ICD-10-CM | POA: Diagnosis not present

## 2021-02-09 DIAGNOSIS — M170 Bilateral primary osteoarthritis of knee: Secondary | ICD-10-CM | POA: Diagnosis not present

## 2021-02-09 DIAGNOSIS — I4891 Unspecified atrial fibrillation: Secondary | ICD-10-CM | POA: Diagnosis not present

## 2021-02-09 DIAGNOSIS — I872 Venous insufficiency (chronic) (peripheral): Secondary | ICD-10-CM | POA: Diagnosis not present

## 2021-02-09 DIAGNOSIS — K219 Gastro-esophageal reflux disease without esophagitis: Secondary | ICD-10-CM | POA: Diagnosis not present

## 2021-02-09 DIAGNOSIS — G8929 Other chronic pain: Secondary | ICD-10-CM | POA: Diagnosis not present

## 2021-02-09 DIAGNOSIS — I739 Peripheral vascular disease, unspecified: Secondary | ICD-10-CM | POA: Diagnosis not present

## 2021-02-09 DIAGNOSIS — Z86711 Personal history of pulmonary embolism: Secondary | ICD-10-CM | POA: Diagnosis not present

## 2021-02-09 DIAGNOSIS — J9611 Chronic respiratory failure with hypoxia: Secondary | ICD-10-CM | POA: Diagnosis not present

## 2021-02-09 DIAGNOSIS — Z87891 Personal history of nicotine dependence: Secondary | ICD-10-CM | POA: Diagnosis not present

## 2021-02-09 DIAGNOSIS — I5032 Chronic diastolic (congestive) heart failure: Secondary | ICD-10-CM | POA: Diagnosis not present

## 2021-02-09 DIAGNOSIS — Z7901 Long term (current) use of anticoagulants: Secondary | ICD-10-CM | POA: Diagnosis not present

## 2021-02-09 NOTE — Telephone Encounter (Signed)
  Care Management   Follow Up Note   02/09/2021 Name: Sharon Vasquez [name]: [id number] DOB: 1950/11/20   Referred by: [name], DO Reason for referral : No chief complaint on file.   An unsuccessful telephone outreach was attempted today. The patient was referred to the case management team for assistance with care management and care coordination.   Follow Up Plan: The care management team will reach out to the patient again over the next 7 days.   Milus Height, [place]  Social Worker [facility] Care Management  [phone]

## 2021-02-09 NOTE — Patient Instructions (Signed)
Visit Information Instructions: patient will work with SW to address concerns related to community resources.  Patient was given the following information about care management and care coordination services today, agreed to services, and gave verbal consent: 1.care management/care coordination services include personalized support from designated clinical staff supervised by their physician, including individualized plan of care and coordination with other care providers 2. 24/7 contact phone numbers for assistance for urgent and routine care needs. 3. The patient may stop care management/care coordination services at any time by phone call to the office staff.  Patient verbalizes understanding of instructions provided today and agrees to view in MyChart.   The care management team will reach out to the patient again over the next 30 days.   Juniel Groene, BSW  Social Worker [facility] Care Management  [phone]      

## 2021-02-09 NOTE — Chronic Care Management (AMB) (Signed)
  Care Management   Social Work Visit Note  02/09/2021 Name: Sharon Vasquez [name]: [id number] DOB: 12/05/1950  Sharon Vasquez is a 69 y.o. year old female who sees [place], Rayann N, DO for primary care. The care management team was consulted for assistance with care management and care coordination needs related to [facility] Resources    Patient was given the following information about care management and care coordination services today, agreed to services, and gave verbal consent: 1.care management/care coordination services include personalized support from designated clinical staff supervised by their physician, including individualized plan of care and coordination with other care providers 2. 24/7 contact phone numbers for assistance for urgent and routine care needs. 3. The patient may stop care management/care coordination services at any time by phone call to the office staff.  Engaged with patient by telephone for initial visit in response to provider referral for social work chronic care management and care coordination services.  Assessment: Review of patient history, allergies, and health status during evaluation of patient need for care management/care coordination services.    Interventions:  Patient interviewed and appropriate assessments performed Collaborated with clinical team regarding patient needs  SW completed SDOH screening and determined no resources are needed. Patient discussed wanting to understand doctors orders regarding home equipment needed.  SW advised of RNCM and patient stated she would like outreach from [facility] to discuss providers orders.  SDOH (Social Determinants of Health) assessments performed: Yes     Plan:  SW will follow up within 30-days  Milus Height, [place] Worker [facility] Care Management  [phone]

## 2021-02-09 NOTE — Telephone Encounter (Signed)
Per patient [employer] do not have any mattress, requesting a call back.

## 2021-02-09 NOTE — Telephone Encounter (Addendum)
Sophie, RN with [facility] called in stating patient's weight has decreased from 386 to 365. Asking if Provider still wants [employer] applied weekly with this weight loss.

## 2021-02-09 NOTE — Telephone Encounter (Signed)
Please ask the provider to assess for any wounds or venous ulcers on the lower extremities. If there is no active wound or skin breakdown, it is ok to stop the unna wraps. We understand that these cannot be placed by [facility] indefinitely. Please work with the patient to encourage her to wear compression socks during the day, and to elevate her legs in the evening.

## 2021-02-09 NOTE — Telephone Encounter (Signed)
Returned call to [employer] and relayed info below from Dr. Evette Doffing.

## 2021-02-10 ENCOUNTER — Other Ambulatory Visit: Payer: Self-pay | Admitting: Gastroenterology

## 2021-02-10 ENCOUNTER — Ambulatory Visit: Payer: Medicare Other

## 2021-02-10 DIAGNOSIS — Z86718 Personal history of other venous thrombosis and embolism: Secondary | ICD-10-CM | POA: Diagnosis not present

## 2021-02-10 DIAGNOSIS — R1319 Other dysphagia: Secondary | ICD-10-CM

## 2021-02-10 DIAGNOSIS — R1312 Dysphagia, oropharyngeal phase: Secondary | ICD-10-CM | POA: Diagnosis not present

## 2021-02-10 DIAGNOSIS — Z8679 Personal history of other diseases of the circulatory system: Secondary | ICD-10-CM | POA: Diagnosis not present

## 2021-02-10 DIAGNOSIS — Z86711 Personal history of pulmonary embolism: Secondary | ICD-10-CM | POA: Diagnosis not present

## 2021-02-10 NOTE — Chronic Care Management (AMB) (Signed)
Care Management    RN Visit Note  02/10/2021 Name: Sharon Vasquez [name]: [id number] DOB: 1950/11/10  Subjective: Sharon Vasquez is a 70 y.o. year old female who is a primary care patient of Atway, Rayann N, DO. The care management team was consulted for assistance with disease management and care coordination needs.    Engaged with patient by telephone for follow up visit in response to provider referral for case management and/or care coordination services.   Consent to Services:   Ms. Wasilewski was given information about Care Management services today including:  Care Management services includes personalized support from designated clinical staff supervised by her physician, including individualized plan of care and coordination with other care providers 24/7 contact phone numbers for assistance for urgent and routine care needs. The patient may stop case management services at any time by phone call to the office staff.  Patient agreed to services and consent obtained.   Assessment: Review of patient past medical history, allergies, medications, health status, including review of consultants reports, laboratory and other test data, was performed as part of comprehensive evaluation and provision of chronic care management services.   SDOH (Social Determinants of Health) assessments and interventions performed:    Care Plan  No Known Allergies  Outpatient Encounter Medications as of 02/10/2021  Medication Sig   albuterol (VENTOLIN HFA) 108 (90 Base) MCG/ACT inhaler Inhale into the lungs.   aspirin EC 81 MG tablet Take 81 mg by mouth daily.   atorvastatin (LIPITOR) 80 MG tablet TAKE 1 TABLET(80 MG) BY MOUTH DAILY   esomeprazole (NEXIUM) 40 MG capsule Take 1 capsule (40 mg total) by mouth daily as needed (for heartburn or indigestion). (Patient taking differently: Take 40 mg by mouth daily.)   Fluticasone-Salmeterol (ADVAIR) 250-50 MCG/DOSE AEPB Inhale 1 puff into the lungs 2 (two)  times daily.   gabapentin (NEURONTIN) 400 MG capsule TAKE 1 CAPSULE BY MOUTH  TWICE DAILY   ipratropium-albuterol (DUONEB) 0.5-2.5 (3) MG/3ML SOLN Take 3 mLs by nebulization every 6 (six) hours as needed. (Patient taking differently: Take 3 mLs by nebulization every 6 (six) hours as needed (shortness of breath/wheezing).)   isosorbide mononitrate (IMDUR) 30 MG 24 hr tablet TAKE 1 TABLET(30 MG) BY MOUTH DAILY   metolazone (ZAROXOLYN) 2.5 MG tablet Take 1 tablet (2.5 mg total) by mouth every other day.   metoprolol succinate (TOPROL XL) 25 MG 24 hr tablet Take 1 tablet (25 mg total) by mouth daily.   mirabegron ER (MYRBETRIQ) 50 MG TB24 tablet Take 1 tablet (50 mg total) by mouth daily.   oxyCODONE (ROXICODONE) 15 MG immediate release tablet Take 15 mg by mouth 4 (four) times daily as needed for pain.   OXYGEN Inhale 2 L into the lungs as needed (shortness of breath).   potassium chloride (KLOR-CON) 10 MEQ tablet Take 1 tablet (10 mEq total) by mouth 2 (two) times daily.   PRESCRIPTION MEDICATION Inhale into the lungs at bedtime. cpap   tiotropium (SPIRIVA HANDIHALER) 18 MCG inhalation capsule PLACE 1 CAPSULE INTO INHALER AND INHALE DAILY (Patient taking differently: Place 18 mcg into inhaler and inhale daily.)   torsemide (DEMADEX) 20 MG tablet Take 2 tablets by mouth daily, you may take an extra 1/2 tablet only as needed for weight gain of 2 lbs overnight or 5 lbs in a week   XARELTO 20 MG TABS tablet TAKE 1 TABLET(20 MG) BY MOUTH EVERY MORNING   No facility-administered encounter medications on file as of 02/10/2021.  Patient Active Problem List   Diagnosis Date Noted   Breast pain, right 01/17/2021   Chest pain 10/08/2020   Chronic heart failure with preserved ejection fraction (HFpEF) (HCC)    Mixed simple and mucopurulent chronic bronchitis (HCC)    Paroxysmal atrial fibrillation ([place])    Osteoarthritis of left hip 10/07/2020   Chronic venous hypertension w ulcer of l low extrem ([place])  10/07/2020   Venous stasis ulcer of left lower extremity ([place]) 08/18/2020   Hyperlipidemia 05/14/2020   Hypertensive urgency 04/10/2020   Esophageal dysmotility 11/10/2019   Chronic respiratory failure with hypoxia ([place]) 09/30/2019   Mixed incontinence, urge and stress (female) (female) 08/21/2019   Overgrown toenails 07/01/2019   Preventative health care 06/04/2019   GERD (gastroesophageal reflux disease) 01/20/2019   Coronary artery calcification seen on CT scan 04/12/2018   ILD (interstitial lung disease) ([place]) 02/13/2018   Pain and numbness of right upper extremity 02/08/2018   Long term current use of amiodarone 12/19/2017   OSA (obstructive sleep apnea) 11/14/2017   Chronic pain syndrome 07/27/2017   Major depression, recurrent, chronic ([place]) 02/01/2017   Osteoporosis 12/14/2016   Drug induced constipation 08/21/2016   Aortic atherosclerosis ([place]) 07/17/2016   Chronic venous insufficiency 07/12/2016   Chronic anticoagulation 07/12/2016   Tobacco use 07/11/2016   Acute exacerbation of chronic obstructive pulmonary disease (COPD) (HCC)    Diastolic heart failure ([place])    History of pulmonary embolism    Atrial fibrillation with controlled ventricular response (HCC)    Vocal cord polyp 12/18/2015   Laryngopharyngeal reflux 12/18/2015   Morbid obesity ([place])    Peripheral vascular disease ([place])    Benign essential HTN    Primary osteoarthritis of both knees 09/16/2014    Conditions to be addressed/monitored: CHF  Care Plan : CCM RN- Heart Failure  Updates made by Johnney Killian, RN since 02/10/2021 12:00 AM     Problem: Symptom Exacerbation (Heart Failure)      Goal: Symptom Exacerbation Prevented or Minimized   Start Date: 10/05/2020  Recent Progress: On track  Priority: High  Note:   Current Barriers:Patient left message that [employer] has not delivered her hospital bed as they are backordered.  She requests this RNCM look into other options for companies.   [place] at [phone] and spoke with Bella Kennedy who took the patient information, she said they would process the request and if there is a problem, they would contact the clinic.  Plan to call them Friday to discuss availability of bed as patient needs to be able to put her feet up due to edema.  Patient shared that she has been having stomach issues and she has appointment to see her gastroenterologist.   Knowledge deficits related to basic heart failure pathophysiology and self care management-  Unable to independently manage heart failure Nurse Case Manager Clinical Goal(s):  patient will weigh self daily and record unless she deems it unsafe to weigh  patient will verbalize understanding of Heart Failure Action Plan and when to call doctor patient will take all Heart Failure mediations as prescribed Interventions:  Collaboration with Atway, Rayann N, DO regarding development and update of comprehensive plan of care as evidenced by provider attestation and co-signature Inter-disciplinary care team collaboration (see longitudinal plan of care) Basic overview and discussion of pathophysiology of Heart Failure Provided written and verbal education on low sodium diet Assessed for scales in home Discussed importance of daily weight when she feels safe to weigh Discussed monitoring her  O2 sats, severity of dyspnea and swelling of legs and feet when she is unable to weight Reviewed signs and sxs requiring provider notification Reviewed role of diuretics in prevention of fluid overload Self-Care Activities:  Takes Heart Failure Medications as prescribed Weighs daily and record (notifying MD of 3 lb weight gain over night or 5 lb in a week) Verbalizes understanding of and follows CHF Action Plan Adheres to low sodium diet  Patient Goals:  - Take Heart Failure Medications as prescribed - Weigh daily and record (notify MD with 3 lb weight gain over night or 5 lb in a week) - Follow CHF Action Plan -  Adhere to low sodium diet - follow rescue plan if symptoms flare-up - know when to call the doctor - track symptoms and what helps feel better or worse - pace activity allowing for rest - call office if I gain more than 2 pounds in one day or 5 pounds in one week - keep legs up while sitting - track weight in diary - use salt in moderation - watch for swelling in feet, ankles and legs every day - weigh myself daily if safe to do so Follow Up Plan: The care management team will reach out to the patient again over the next week to assist with hospital bed.       Plan: Telephone follow up appointment with care management team member scheduled for:  Following up on hospital bed within the next week  Johnney Killian, RN, BSN, [facility] Phone: [phone]: [phone]

## 2021-02-10 NOTE — Patient Instructions (Signed)
Visit Information;  The patient verbalized understanding of instructions, educational materials, and care plan provided today and declined offer to receive copy of patient instructions, educational materials, and care plan.   Follow up with provider re: Obtaining hospital bed  Johnney Killian, RN, BSN, [facility] Phone: [phone]: [phone]

## 2021-02-10 NOTE — Telephone Encounter (Signed)
Message Received: Today Johnney Killian, RN  P Imp Triage Nurse Pool; [place], Makaylyn Sinyard C, [place]; P Imp Front Desk Pool Good afternoon,  I am working on [facility]' hospital bed.  [employer] is back ordered for the mattresses so I called [employer] and they are going to process and call me if there are issues.  I will follow up with them tomorrow.  Alycia Rossetti, RN, BSN, CCM  Care Management Coordinator  [facility]  Phone: [phone]: [phone]

## 2021-02-11 ENCOUNTER — Other Ambulatory Visit (HOSPITAL_COMMUNITY): Payer: Self-pay

## 2021-02-11 ENCOUNTER — Telehealth (HOSPITAL_COMMUNITY): Payer: Self-pay

## 2021-02-11 ENCOUNTER — Ambulatory Visit: Payer: Self-pay

## 2021-02-11 DIAGNOSIS — R1310 Dysphagia, unspecified: Secondary | ICD-10-CM

## 2021-02-11 NOTE — Telephone Encounter (Signed)
Attempted to contact patient to schedule OP MBS - left voicemail. ?

## 2021-02-11 NOTE — Chronic Care Management (AMB) (Signed)
  Chronic Care Management   Note  02/11/2021 Name: Sharon Vasquez [name]: [id number] DOB: [birth year]  Follow up plan: Received fax yesterday afternoon from [place] stating they could not get Ms. Bonanno a hospital bed as they are on backorder.  This RNCM has spoken with Demet[facility] Pore and [facility] and all of them do not have a hospital bed in stock.  Coordinated with Stephannie Peters at [facility] as they were the original provider who was getting the patients hospital bed.  She shared the beds are back ordered and she is unsure how long it will take to get a bed.  She has called the other [facility] centers within [place] and was unsuccessful.  Danielle assured this RNCM that as soon as a bed comes available, Ms. Grossi will get hers. Successful outreach to patient to let her know we are unable to find a provider that has a hospital bed in stock and she will get hers as soon as possible. Patient noted appreciation for trying other vendors.  Johnney Killian, RN, BSN, CCM Care Management Coordinator [facility] Phone: [phone]: [phone]

## 2021-02-14 ENCOUNTER — Ambulatory Visit (HOSPITAL_COMMUNITY): Payer: Medicare Other

## 2021-02-14 DIAGNOSIS — I5032 Chronic diastolic (congestive) heart failure: Secondary | ICD-10-CM | POA: Diagnosis not present

## 2021-02-14 DIAGNOSIS — I110 Hypertensive heart disease with heart failure: Secondary | ICD-10-CM | POA: Diagnosis not present

## 2021-02-14 DIAGNOSIS — M170 Bilateral primary osteoarthritis of knee: Secondary | ICD-10-CM | POA: Diagnosis not present

## 2021-02-14 DIAGNOSIS — I872 Venous insufficiency (chronic) (peripheral): Secondary | ICD-10-CM | POA: Diagnosis not present

## 2021-02-14 DIAGNOSIS — M810 Age-related osteoporosis without current pathological fracture: Secondary | ICD-10-CM | POA: Diagnosis not present

## 2021-02-14 DIAGNOSIS — Z86711 Personal history of pulmonary embolism: Secondary | ICD-10-CM | POA: Diagnosis not present

## 2021-02-14 DIAGNOSIS — Z7901 Long term (current) use of anticoagulants: Secondary | ICD-10-CM | POA: Diagnosis not present

## 2021-02-14 DIAGNOSIS — J9611 Chronic respiratory failure with hypoxia: Secondary | ICD-10-CM | POA: Diagnosis not present

## 2021-02-14 DIAGNOSIS — E785 Hyperlipidemia, unspecified: Secondary | ICD-10-CM | POA: Diagnosis not present

## 2021-02-14 DIAGNOSIS — Z87891 Personal history of nicotine dependence: Secondary | ICD-10-CM | POA: Diagnosis not present

## 2021-02-14 DIAGNOSIS — G8929 Other chronic pain: Secondary | ICD-10-CM | POA: Diagnosis not present

## 2021-02-14 DIAGNOSIS — Z9981 Dependence on supplemental oxygen: Secondary | ICD-10-CM | POA: Diagnosis not present

## 2021-02-14 DIAGNOSIS — Z86718 Personal history of other venous thrombosis and embolism: Secondary | ICD-10-CM | POA: Diagnosis not present

## 2021-02-14 DIAGNOSIS — I739 Peripheral vascular disease, unspecified: Secondary | ICD-10-CM | POA: Diagnosis not present

## 2021-02-14 DIAGNOSIS — I4891 Unspecified atrial fibrillation: Secondary | ICD-10-CM | POA: Diagnosis not present

## 2021-02-14 DIAGNOSIS — K219 Gastro-esophageal reflux disease without esophagitis: Secondary | ICD-10-CM | POA: Diagnosis not present

## 2021-02-14 DIAGNOSIS — J449 Chronic obstructive pulmonary disease, unspecified: Secondary | ICD-10-CM | POA: Diagnosis not present

## 2021-02-14 DIAGNOSIS — G4733 Obstructive sleep apnea (adult) (pediatric): Secondary | ICD-10-CM | POA: Diagnosis not present

## 2021-02-16 ENCOUNTER — Other Ambulatory Visit: Payer: Medicare Other

## 2021-02-16 ENCOUNTER — Other Ambulatory Visit: Payer: Self-pay | Admitting: Gastroenterology

## 2021-02-16 ENCOUNTER — Ambulatory Visit (HOSPITAL_COMMUNITY)
Admission: RE | Admit: 2021-02-16 | Discharge: 2021-02-16 | Disposition: A | Discharge status: Home / Self Care | Payer: Medicare Other | Source: Ambulatory Visit | Attending: Gastroenterology | Admitting: Gastroenterology

## 2021-02-16 ENCOUNTER — Telehealth: Payer: Self-pay

## 2021-02-16 ENCOUNTER — Other Ambulatory Visit: Payer: Self-pay | Admitting: Student

## 2021-02-16 ENCOUNTER — Other Ambulatory Visit: Payer: Self-pay

## 2021-02-16 ENCOUNTER — Ambulatory Visit: Payer: Self-pay

## 2021-02-16 ENCOUNTER — Ambulatory Visit (HOSPITAL_COMMUNITY): Admission: RE | Admit: 2021-02-16 | Payer: Medicare Other | Source: Ambulatory Visit

## 2021-02-16 DIAGNOSIS — R1319 Other dysphagia: Secondary | ICD-10-CM | POA: Insufficient documentation

## 2021-02-16 DIAGNOSIS — K224 Dyskinesia of esophagus: Secondary | ICD-10-CM | POA: Diagnosis not present

## 2021-02-16 MED ORDER — MIRABEGRON ER 50 MG PO TB24: 50.0000 mg | ORAL_TABLET | Freq: Every day | ORAL | 1 refills | Status: DC

## 2021-02-16 NOTE — Chronic Care Management (AMB) (Signed)
.    02/16/2021  Sharon Vasquez Nov 27, 1950 [id number]  Received call from patient requesting this RNCM investigate what the name of her urologist is and to let her know.  Called patient back and discussed her urologist, Dr. Matilde Sprang, and she noted that she is out of her Mirabegron ER 50mg  (Mybetriq), which she received prescription from her urologist. Notified and collaborate with Dr. Johnney Ou who was able to send in a prescription for refill of the Myrbetriq 50mg  ER.  Patient notified of refill authorization.  Sharon Killian, RN, BSN, CCM Care Management Coordinator [facility] Phone: [phone]: [phone]

## 2021-02-16 NOTE — Telephone Encounter (Signed)
  Chronic Care Management   Note  02/16/2021 Name: Sharon Vasquez [name]: [id number] DOB: 01/17/1951    Follow up plan: Received incoming call from patient requesting a refill on her Mirabegron ER (Myrbetriq 50mg  TB24) to be sent to her [place].  Message sent to [facility] team for review. Advised patient this RNCM would contact her if there was an issue but to check with her pharmacy later in the day.  Johnney Killian, RN, BSN, CCM Care Management Coordinator [facility] Internal [facility] Phone: [phone]: [phone]

## 2021-02-17 DIAGNOSIS — J449 Chronic obstructive pulmonary disease, unspecified: Secondary | ICD-10-CM | POA: Diagnosis not present

## 2021-02-18 ENCOUNTER — Other Ambulatory Visit: Payer: Self-pay | Admitting: Gastroenterology

## 2021-02-21 ENCOUNTER — Telehealth: Payer: Self-pay | Admitting: *Deleted

## 2021-02-21 NOTE — Telephone Encounter (Signed)
   [facility] Pre-operative Risk Assessment    Patient Name: Sharon Vasquez  DOB: 11/16/1950 [name]: [id number]  HEARTCARE STAFF:  - [facility]!!!!!! Under Visit Info/Reason for Call, type in Other and utilize the format Clearance MM/DD/YY or Clearance TBD. Do not use dashes or single digits. - Please review there is not already an duplicate clearance open for this procedure. - If request is for dental extraction, please clarify the # of teeth to be extracted. - If the patient is currently at the dentist's office, call Pre-Op Callback Staff (MA/nurse) to input urgent request.  - If the patient is not currently in the dentist office, please route to the Pre-Op pool.  Request for surgical clearance:  What type of surgery is being performed? endoscopy  When is this surgery scheduled? 04/12/21  What type of clearance is required (medical clearance vs. Pharmacy clearance to hold med vs. Both)? both  Are there any medications that need to be held prior to surgery and how long? Xarelto-3 days prior  Practice name and name of physician performing surgery? [facility]  What is the office phone number? [phone]   7.   What is the office fax number? [phone]  8.   Anesthesia type (None, local, MAC, general) ? propofol   Fredia Beets 02/21/2021, 10:21 AM  _________________________________________________________________   (provider comments below)

## 2021-02-21 NOTE — Telephone Encounter (Signed)
Patient with diagnosis of afib and VTE on Xarelto for anticoagulation.    Procedure: endoscopy Date of procedure: 04/12/21  CHA2DS2-VASc Score = 5  This indicates a 7.2% annual risk of stroke. The patient's score is based upon: CHF History: 1 HTN History: 1 Diabetes History: 0 Stroke History: 0 Vascular Disease History: 1 Age Score: 1 Gender Score: 1   Multiple bilateral PEs and DVT 09/2014 in [place].  CrCl >149mL/min Platelet count 364K  Request is to hold Xarelto for 3 days prior to procedure. 3 day DOAC hold is not required prior to endoscopy, pt has normal renal function. Recommend holding Xarelto for 1-2 days max and resume as soon as safely possible.

## 2021-02-22 DIAGNOSIS — Z86711 Personal history of pulmonary embolism: Secondary | ICD-10-CM | POA: Diagnosis not present

## 2021-02-22 DIAGNOSIS — M810 Age-related osteoporosis without current pathological fracture: Secondary | ICD-10-CM | POA: Diagnosis not present

## 2021-02-22 DIAGNOSIS — I4891 Unspecified atrial fibrillation: Secondary | ICD-10-CM | POA: Diagnosis not present

## 2021-02-22 DIAGNOSIS — E785 Hyperlipidemia, unspecified: Secondary | ICD-10-CM | POA: Diagnosis not present

## 2021-02-22 DIAGNOSIS — J449 Chronic obstructive pulmonary disease, unspecified: Secondary | ICD-10-CM | POA: Diagnosis not present

## 2021-02-22 DIAGNOSIS — Z86718 Personal history of other venous thrombosis and embolism: Secondary | ICD-10-CM | POA: Diagnosis not present

## 2021-02-22 DIAGNOSIS — G4733 Obstructive sleep apnea (adult) (pediatric): Secondary | ICD-10-CM | POA: Diagnosis not present

## 2021-02-22 DIAGNOSIS — G8929 Other chronic pain: Secondary | ICD-10-CM | POA: Diagnosis not present

## 2021-02-22 DIAGNOSIS — Z7901 Long term (current) use of anticoagulants: Secondary | ICD-10-CM | POA: Diagnosis not present

## 2021-02-22 DIAGNOSIS — I739 Peripheral vascular disease, unspecified: Secondary | ICD-10-CM | POA: Diagnosis not present

## 2021-02-22 DIAGNOSIS — Z87891 Personal history of nicotine dependence: Secondary | ICD-10-CM | POA: Diagnosis not present

## 2021-02-22 DIAGNOSIS — I5032 Chronic diastolic (congestive) heart failure: Secondary | ICD-10-CM | POA: Diagnosis not present

## 2021-02-22 DIAGNOSIS — I110 Hypertensive heart disease with heart failure: Secondary | ICD-10-CM | POA: Diagnosis not present

## 2021-02-22 DIAGNOSIS — Z9981 Dependence on supplemental oxygen: Secondary | ICD-10-CM | POA: Diagnosis not present

## 2021-02-22 DIAGNOSIS — K219 Gastro-esophageal reflux disease without esophagitis: Secondary | ICD-10-CM | POA: Diagnosis not present

## 2021-02-22 DIAGNOSIS — J9611 Chronic respiratory failure with hypoxia: Secondary | ICD-10-CM | POA: Diagnosis not present

## 2021-02-22 DIAGNOSIS — I872 Venous insufficiency (chronic) (peripheral): Secondary | ICD-10-CM | POA: Diagnosis not present

## 2021-02-22 DIAGNOSIS — M170 Bilateral primary osteoarthritis of knee: Secondary | ICD-10-CM | POA: Diagnosis not present

## 2021-03-02 ENCOUNTER — Other Ambulatory Visit: Payer: Self-pay

## 2021-03-02 ENCOUNTER — Other Ambulatory Visit: Payer: Self-pay | Admitting: Internal Medicine

## 2021-03-02 ENCOUNTER — Ambulatory Visit
Admission: RE | Admit: 2021-03-02 | Discharge: 2021-03-02 | Disposition: A | Discharge status: Home / Self Care | Payer: Medicare Other | Source: Ambulatory Visit | Attending: Internal Medicine | Admitting: Internal Medicine

## 2021-03-02 ENCOUNTER — Ambulatory Visit: Payer: Self-pay

## 2021-03-02 ENCOUNTER — Ambulatory Visit: Payer: Medicare Other

## 2021-03-02 DIAGNOSIS — N6310 Unspecified lump in the right breast, unspecified quadrant: Secondary | ICD-10-CM

## 2021-03-02 DIAGNOSIS — N644 Mastodynia: Secondary | ICD-10-CM | POA: Diagnosis not present

## 2021-03-02 DIAGNOSIS — R922 Inconclusive mammogram: Secondary | ICD-10-CM | POA: Diagnosis not present

## 2021-03-02 NOTE — Chronic Care Management (AMB) (Signed)
   03/02/2021  Sharon Vasquez [birth year] [id number]  Received phone call from patient this morning.  She was very distressed as her legs are quite edematous and she had an episode if incontinence last night which she was very upset about.  Patient would like to see a doctor at the clinic and she was set up for appointment on 03/03/21.  Patient is waiting for her backordered hospital bed to come so she can elevate her feet.  Message sent to Stephannie Peters at [employer] to see if there is any estimate on delivery date of the hospital bed.  Pending response from [facility].  Plan to contact patient when a response is received. Johnney Killian, RN, BSN, CCM Care Management Coordinator [facility] Internal [facility] Phone: [phone]: [phone]

## 2021-03-03 ENCOUNTER — Encounter: Payer: Self-pay | Admitting: Student

## 2021-03-03 ENCOUNTER — Ambulatory Visit (INDEPENDENT_AMBULATORY_CARE_PROVIDER_SITE_OTHER): Payer: Medicare Other | Admitting: Student

## 2021-03-03 VITALS — BP 135/85 | HR 95 | Wt 369.4 lb

## 2021-03-03 DIAGNOSIS — M79671 Pain in right foot: Secondary | ICD-10-CM

## 2021-03-03 DIAGNOSIS — G4733 Obstructive sleep apnea (adult) (pediatric): Secondary | ICD-10-CM | POA: Diagnosis not present

## 2021-03-03 DIAGNOSIS — J849 Interstitial pulmonary disease, unspecified: Secondary | ICD-10-CM

## 2021-03-03 DIAGNOSIS — J9611 Chronic respiratory failure with hypoxia: Secondary | ICD-10-CM | POA: Diagnosis not present

## 2021-03-03 DIAGNOSIS — J449 Chronic obstructive pulmonary disease, unspecified: Secondary | ICD-10-CM | POA: Diagnosis not present

## 2021-03-03 DIAGNOSIS — I5033 Acute on chronic diastolic (congestive) heart failure: Secondary | ICD-10-CM | POA: Diagnosis not present

## 2021-03-03 DIAGNOSIS — I110 Hypertensive heart disease with heart failure: Secondary | ICD-10-CM

## 2021-03-03 DIAGNOSIS — I509 Heart failure, unspecified: Secondary | ICD-10-CM | POA: Diagnosis not present

## 2021-03-03 DIAGNOSIS — M170 Bilateral primary osteoarthritis of knee: Secondary | ICD-10-CM | POA: Diagnosis not present

## 2021-03-03 DIAGNOSIS — I10 Essential (primary) hypertension: Secondary | ICD-10-CM

## 2021-03-03 DIAGNOSIS — I5032 Chronic diastolic (congestive) heart failure: Secondary | ICD-10-CM

## 2021-03-03 HISTORY — DX: Pain in right foot: M79.671

## 2021-03-03 MED ORDER — FLUTICASONE-SALMETEROL 250-50 MCG/ACT IN AEPB: 1.0000 | INHALATION_SPRAY | Freq: Two times a day (BID) | RESPIRATORY_TRACT | 2 refills | Status: DC

## 2021-03-03 MED ORDER — DICLOFENAC SODIUM 1 % EX GEL: 2.0000 g | Freq: Four times a day (QID) | CUTANEOUS | 1 refills | Status: DC

## 2021-03-03 MED ORDER — SPIRIVA HANDIHALER 18 MCG IN CAPS: ORAL_CAPSULE | RESPIRATORY_TRACT | 1 refills | Status: DC

## 2021-03-03 MED ORDER — POTASSIUM CHLORIDE CRYS ER 10 MEQ PO TBCR: 10.0000 meq | EXTENDED_RELEASE_TABLET | Freq: Two times a day (BID) | ORAL | 1 refills | Status: DC

## 2021-03-03 NOTE — Assessment & Plan Note (Addendum)
She endorses pain at the dorsal surface of her right foot that has been going on for 3 weeks.  Pain is worse with standing up.  She denies any trauma or falls recently.  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  She is not sure at this has to do with her right foot pain.  She is taking oxycodone for chronic pain which also helps with the foot pain.  Physical exam is reassuring.  She has mild tenderness to palpation in the right dorsal surface.  There was no erythema or swelling of right foot.  No pain of her right toes.  Normal range of motion of her ankle.  She has hammertoes with severe disfigured toenails.   Assessment and plan Low suspicion for gout.  Low suspicion for fracture with no history of trauma.  Suspect osteoarthritis due to increased activity recently.  No indication for imaging at this time.  Ideally I would refer patient to podiatrist.  However with her limited transportation, will hold off on the referral.  Advised patient to rest the foot to allow healing.  Also prescribed Voltaren gel to use in conjunction with her oxycodone.

## 2021-03-03 NOTE — Assessment & Plan Note (Addendum)
Patient returns for 4 weeks follow-up.  Fortunately, her volume status has improved.  Her weight came down from 381 to 369 pounds.  No pitting edema appreciated on physical exam.  No crackles on lungs auscultation.  Patient report adherence to her torsemide 40 mg daily.  She only takes metolazone as needed and has not been taking it in the last few days.  She endorses good urine output.  Given her improved volume status, will continue torsemide 40 mg and metolazone PRN.  Will check BMP today for kidney function and electrolytes.   Her potassium was low at 3.3 last time.  She reports taking potassium powder but unsure who prescribed that for her.  Will refill potassium tablet.  Addendum Potassium came back low at 2.7.  Patient was asymptomatic.  Will start KCl 40 mEq twice daily for the next 5 days.  Patient will come back in 5 days for repeat BMP.  Addendum Potassium improved to 3.5.  Will resume her maintenance potassium 20 mEq daily. Will follow-up in 4 weeks for repeat BMP

## 2021-03-03 NOTE — Patient Instructions (Signed)
Sharon Vasquez,  It was a pleasure seeing you in the clinic today.  Here is a summary of what we talked about:  1.  Volume overload from heart failure: I am glad that you are weight is going down.  There is not much fluid buildup in your legs.  Please continue the torsemide and metolazone.  I will check blood work for your kidney function today.  2.  Right foot pain: I will prescribe Voltaren gel for your foot.  Please avoid heavy exertion or weightbearing on the right foot in the next few days.  3.  COPD: I will refill the Advair and Spiriva.  The Advair is 2 puffs a day.  Spiriva is 1 capsule a day.  Please return in 4 weeks,  Take care,  Dr. Alfonse Spruce

## 2021-03-03 NOTE — Assessment & Plan Note (Signed)
Patient endorses persistent shortness of breath with exertion.  She endorses wheezing without coughing.  When asked about her inhaler regimen, she reports not taking Spiriva and only take Advair 1 puff a day.  She last used her rescue inhaler today.  Lung auscultation reveals mild diffuse wheezing.  Patient is not in respiratory distress.  No audible wheezing heard.  Suspect that her incorrect use of inhalers is causing her dyspnea with exertion.  Do not believe the patient will need treatment for COPD exacerbation at this time.  Will refill inhalers and advised patient on correct use.  -Follow-up in 4 weeks

## 2021-03-03 NOTE — Progress Notes (Signed)
   CC: Right foot pain, HFpEF eval  HPI:  [name] is a 70 y.o. living with HFpEF, A. fib on Xarelto, CAD, PVD, ILD on 2 L of oxygen supplement at home, who presented to the clinic for 1 month follow-up of HFpEF exacerbation.  She also endorses right foot pain.  Please see problem based charting for detailed  Past Medical History:  Diagnosis Date   A-fib [facility])    Anxiety    Arthritis    "qwhre; joints, back" (04/17/2017)   Benign breast cyst in female, left 01/08/2017   Found by Screening mammogram, evaluated by U/S on 01/08/17 and determined to be a benign simple breast cyst.   Cellulitis of left lower leg 05/30/2017   CHF (congestive heart failure) (HCC)    Chronic low back pain 08/21/2016   Chronic lower back pain    Chronic venous insufficiency    /notes 05/30/2017   COPD (chronic obstructive pulmonary disease) (HCC)    Depression    DVT (deep venous thrombosis) ([place]) 11/16/2016   GERD (gastroesophageal reflux disease)    Headache    "weekly for the last 3 months" (04/17/2017)   Hyperlipidemia    Hypertension    Morbid obesity ([place])    PE (pulmonary embolism)    Pulmonary embolism ([place]) 09/21/2014   Sleep apnea    Review of Systems: Per HPI  Physical Exam:  Vitals:   03/03/21 1555 03/03/21 1638  BP: (!) 162/87 135/85  Pulse: 95   SpO2: 97%   Weight: (!) 369 lb 6.4 oz (167.6 kg)    Physical Exam Constitutional:      General: She is not in acute distress.    Appearance: She is obese.  HENT:     Head: Normocephalic.  Eyes:     General:        Right eye: No discharge.        Left eye: No discharge.     Conjunctiva/sclera: Conjunctivae normal.  Cardiovascular:     Rate and Rhythm: Normal rate and regular rhythm.     Heart sounds: No murmur heard.    Comments: No pitting edema.  Chronic venous stasis dermatosis of bilateral lower extremity. Pulmonary:     Comments: Mild diffuse wheezing.  No audible wheezing.  No signs of respiratory distress.  Patient is  able to speak in full sentence.  Musculoskeletal:     Comments: Mild tenderness to palpation of the dorsal surface of right foot.  No pain to palpation of her toes.  No erythema or swelling of her foot.  Normal range of motion of right ankle.  Patient has hammertoes with severe disfigured toenails.  Skin:    General: Skin is warm.  Neurological:     General: No focal deficit present.     Mental Status: She is alert.  Psychiatric:        Mood and Affect: Mood normal.        Behavior: Behavior normal.      Assessment & Plan:   See Encounters Tab for problem based charting.  Patient discussed with Dr.  Saverio Danker

## 2021-03-03 NOTE — Assessment & Plan Note (Signed)
Blood pressure well controlled 135/85.  She states that benazepril was taken off her medication list, but unsure who did.  -Continue metoprolol and Imdur

## 2021-03-04 ENCOUNTER — Ambulatory Visit: Payer: Medicare Other

## 2021-03-04 ENCOUNTER — Encounter: Payer: Medicare Other | Admitting: Student

## 2021-03-04 DIAGNOSIS — I10 Essential (primary) hypertension: Secondary | ICD-10-CM

## 2021-03-04 DIAGNOSIS — J449 Chronic obstructive pulmonary disease, unspecified: Secondary | ICD-10-CM

## 2021-03-04 DIAGNOSIS — I5032 Chronic diastolic (congestive) heart failure: Secondary | ICD-10-CM

## 2021-03-04 LAB — BMP8+ANION GAP
Anion Gap: 17 mmol/L (ref 10.0–18.0)
BUN/Creatinine Ratio: 14 (ref 12–28)
BUN: 14 mg/dL (ref 8–27)
CO2: 26 mmol/L (ref 20–29)
Calcium: 8.8 mg/dL (ref 8.7–10.3)
Chloride: 98 mmol/L (ref 96–106)
Creatinine, Ser: 1.03 mg/dL — ABNORMAL HIGH (ref 0.57–1.00)
Glucose: 144 mg/dL — ABNORMAL HIGH (ref 70–99)
Potassium: 2.7 mmol/L — ABNORMAL LOW (ref 3.5–5.2)
Sodium: 141 mmol/L (ref 134–144)
eGFR: 58 mL/min/{1.73_m2} — ABNORMAL LOW (ref >59)

## 2021-03-04 MED ORDER — POTASSIUM CHLORIDE CRYS ER 20 MEQ PO TBCR: 40.0000 meq | EXTENDED_RELEASE_TABLET | Freq: Two times a day (BID) | ORAL | 0 refills | Status: DC

## 2021-03-04 NOTE — Progress Notes (Signed)
Internal Medicine Clinic Attending  Case discussed with Dr. Nguyen  At the time of the visit.  We reviewed the resident's history and exam and pertinent patient test results.  I agree with the assessment, diagnosis, and plan of care documented in the resident's note. 

## 2021-03-04 NOTE — Telephone Encounter (Signed)
Patient was returning call in regards to her surgical clearance. The patient was on the bus and will call us back on Monday

## 2021-03-04 NOTE — Chronic Care Management (AMB) (Signed)
Care Management    RN Visit Note  03/04/2021 Name: Sharon Vasquez [name]: [id number] DOB: 05-Jan-1951  Subjective: Sharon Vasquez is a 70 y.o. year old female who is a primary care patient of [place], Rayann N, DO. The care management team was consulted for assistance with disease management and care coordination needs.    Engaged with patient by telephone for follow up visit in response to provider referral for case management and/or care coordination services.   Consent to Services:   Ms. Kattner was given information about Care Management services today including:  Care Management services includes personalized support from designated clinical staff supervised by her physician, including individualized plan of care and coordination with other care providers 24/7 contact phone numbers for assistance for urgent and routine care needs. The patient may stop case management services at any time by phone call to the office staff.  Patient agreed to services and consent obtained.   Assessment: Review of patient past medical history, allergies, medications, health status, including review of consultants reports, laboratory and other test data, was performed as part of comprehensive evaluation and provision of chronic care management services.   SDOH (Social Determinants of Health) assessments and interventions performed:    Care Plan  No Known Allergies  Outpatient Encounter Medications as of 03/04/2021  Medication Sig   albuterol (VENTOLIN HFA) 108 (90 Base) MCG/ACT inhaler Inhale into the lungs.   aspirin EC 81 MG tablet Take 81 mg by mouth daily.   atorvastatin (LIPITOR) 80 MG tablet TAKE 1 TABLET(80 MG) BY MOUTH DAILY   diclofenac Sodium (VOLTAREN) 1 % GEL Apply 2 g topically 4 (four) times daily.   esomeprazole (NEXIUM) 40 MG capsule Take 1 capsule (40 mg total) by mouth daily as needed (for heartburn or indigestion). (Patient taking differently: Take 40 mg by mouth daily.)    fluticasone-salmeterol (ADVAIR) 250-50 MCG/ACT AEPB Inhale 1 puff into the lungs in the morning and at bedtime.   gabapentin (NEURONTIN) 400 MG capsule TAKE 1 CAPSULE BY MOUTH  TWICE DAILY   ipratropium-albuterol (DUONEB) 0.5-2.5 (3) MG/3ML SOLN Take 3 mLs by nebulization every 6 (six) hours as needed. (Patient taking differently: Take 3 mLs by nebulization every 6 (six) hours as needed (shortness of breath/wheezing).)   isosorbide mononitrate (IMDUR) 30 MG 24 hr tablet TAKE 1 TABLET(30 MG) BY MOUTH DAILY   metolazone (ZAROXOLYN) 2.5 MG tablet Take 1 tablet (2.5 mg total) by mouth every other day.   metoprolol succinate (TOPROL XL) 25 MG 24 hr tablet Take 1 tablet (25 mg total) by mouth daily.   mirabegron ER (MYRBETRIQ) 50 MG TB24 tablet Take 1 tablet (50 mg total) by mouth daily.   oxyCODONE (ROXICODONE) 15 MG immediate release tablet Take 15 mg by mouth 4 (four) times daily as needed for pain.   OXYGEN Inhale 2 L into the lungs as needed (shortness of breath).   potassium chloride (KLOR-CON M) 10 MEQ tablet Take 1 tablet (10 mEq total) by mouth 2 (two) times daily.   PRESCRIPTION MEDICATION Inhale into the lungs at bedtime. cpap   tiotropium (SPIRIVA HANDIHALER) 18 MCG inhalation capsule PLACE 1 CAPSULE INTO INHALER AND INHALE DAILY   torsemide (DEMADEX) 20 MG tablet Take 2 tablets by mouth daily, you may take an extra 1/2 tablet only as needed for weight gain of 2 lbs overnight or 5 lbs in a week   XARELTO 20 MG TABS tablet TAKE 1 TABLET(20 MG) BY MOUTH EVERY MORNING   No  facility-administered encounter medications on file as of 03/04/2021.    Patient Active Problem List   Diagnosis Date Noted   Right foot pain 03/03/2021   Breast pain, right 01/17/2021   Chronic heart failure with preserved ejection fraction (HFpEF) (HCC)    Paroxysmal atrial fibrillation (HCC)    Osteoarthritis of left hip 10/07/2020   Venous stasis ulcer of left lower extremity ([place]) 08/18/2020   Hyperlipidemia  05/14/2020   Esophageal dysmotility 11/10/2019   Chronic respiratory failure with hypoxia ([place]) 09/30/2019   Mixed incontinence, urge and stress (female) (female) 08/21/2019   Preventative health care 06/04/2019   GERD (gastroesophageal reflux disease) 01/20/2019   ILD (interstitial lung disease) ([place]) 02/13/2018   OSA (obstructive sleep apnea) 11/14/2017   Chronic pain syndrome 07/27/2017   Major depression, recurrent, chronic ([place]) 02/01/2017   Osteoporosis 12/14/2016   Drug induced constipation 08/21/2016   Aortic atherosclerosis ([place]) 07/17/2016   Chronic venous insufficiency 07/12/2016   Chronic anticoagulation 07/12/2016   Tobacco use 07/11/2016   Acute exacerbation of chronic obstructive pulmonary disease (COPD) (HCC)    Diastolic heart failure ([place])    History of pulmonary embolism    Atrial fibrillation with controlled ventricular response (HCC)    Vocal cord polyp 12/18/2015   Laryngopharyngeal reflux 12/18/2015   Morbid obesity ([place])    Peripheral vascular disease ([place])    Benign essential HTN    Primary osteoarthritis of both knees 09/16/2014    Conditions to be addressed/monitored: CHF, HTN, and COPD  Care Plan : CCM RN- COPD (Adult)  Updates made by Johnney Killian, RN since 03/04/2021 12:00 AM     Problem: Symptom Exacerbation (COPD)      Goal: Symptom Exacerbation Prevented or Minimized   Start Date: 06/24/2019  Expected End Date: 01/30/2021  Recent Progress: On track  Priority: High  Note:   CARE PLAN ENTRY (see longitudinal plan of care for additional care plan information) Current Barriers:  Knowledge Deficits related to plan of care for management of CHF, HTN, and COPD  Chronic Disease Management support and education needs related to CHF, HTN, and COPD  Lacks caregiver support Financial Constraints   RNCM Clinical Goal(s):  Patient will verbalize understanding of plan for management of CHF, HTN, and COPD as evidenced by continued discussions with  provider and RNCM. demonstrate Ongoing adherence to prescribed treatment plan for CHF, HTN, and COPD as evidenced by weight and hypertension monitoring. continue to work with RN Care Manager to address care management and care coordination needs related to  CHF, HTN, and COPD as evidenced by adherence to CM Team Scheduled appointments not experience hospital admission as evidenced by review of EMR. Hospital Admissions in last 6 months = 0  through collaboration with RN Care manager, provider, and care team.   Interventions: 1:1 collaboration with primary care provider regarding development and update of comprehensive plan of care as evidenced by provider attestation and co-signature Inter-disciplinary care team collaboration (see longitudinal plan of care) Evaluation of current treatment plan related to  self management and patient's adherence to plan as established by provider   Heart Failure Interventions:  (Status:  Goal on track:  NO.) Long Term Goal Basic overview and discussion of pathophysiology of Heart Failure reviewed Discussed importance of daily weight and advised patient to weigh and record daily Discussed the importance of keeping all appointments with provider  COPD Interventions:  (Status:  Goal on track:  NO.) Long Term Goal Advised patient to track and manage COPD triggers Provided instruction  about proper use of medications used for management of COPD including inhalers Discussed the importance of adequate rest and management of fatigue with COPD   Hypertension Interventions:  (Status:  Goal on track:  Yes.) Long Term Goal Last practice recorded BP readings:  BP Readings from Last 3 Encounters:  03/03/21 135/85  02/02/21 124/90  01/28/21 123/79  Most recent eGFR/CrCl:  Lab Results  Component Value Date   EGFR 58 (L) 03/03/2021    No components found for: CRCL  Evaluation of current treatment plan related to hypertension self management and patient's adherence to  plan as established by provider Reviewed medications with patient and discussed importance of compliance Discussed plans with patient for ongoing care management follow up and provided patient with direct contact information for care management team Advised patient, providing education and rationale, to monitor blood pressure daily and record, calling PCP for findings outside established parameters Reviewed scheduled/upcoming provider appointments including:   Patient Goals/Self-Care Activities: Take all medications as prescribed Attend all scheduled provider appointments Call pharmacy for medication refills 3-7 days in advance of running out of medications Perform all self care activities independently  Perform IADL's (shopping, preparing meals, housekeeping, managing finances) independently Call provider office for new concerns or questions  call office if I gain more than 2 pounds in one day or 5 pounds in one week track weight in diary watch for swelling in feet, ankles and legs every day weigh myself daily begin a heart failure diary bring diary to all appointments develop a rescue plan identify and avoid work-related triggers begin a symptom diary follow rescue plan if symptoms flare-up keep follow-up appointments:   check blood pressure 3 times per week choose a place to take my blood pressure (home, clinic or office, retail store) write blood pressure results in a log or diary keep a blood pressure log call doctor for signs and symptoms of high blood pressure keep all doctor appointments take medications for blood pressure exactly as prescribed  Follow Up Plan:  The patient has been provided with contact information for the care management team and has been advised to call with any health related questions or concerns.   Current Barriers:  Chronic Disease Management support, education, and care coordination needs related to    Clinical Goal(s) related to Atrial  Fibrillation, CHF, HTN, and COPD:  Over the next 30 days, patient will:  Work with the care management team to address educational, disease management, and care coordination needs  Begin or continue self health monitoring activities as directed today Measure and record blood pressure 5-7 times per week and assess swelling in lower extremities daily, check O2 sats when short of breath  Call provider office for new or worsened signs and symptoms Blood pressure findings outside established parameters, breathing outside of baseline,  Shortness of breath, and New or worsened symptom related to CHF and COPD Call care management team with questions or concerns Verbalize basic understanding of patient centered plan of care established today  Interventions related to Atrial Fibrillation, CHF, HTN, and COPD:  Appropriate assessments completed Reviewed medications with patient and assessed medication taking behavior;  if indicated, discussed importance of medication adherence  Discussed plans with patient for ongoing care management follow up and ensured patient has contact number for CCM team and clinic  Patient Self Care Activities related to Atrial Fibrillation, CHF, HTN, and COPD:  Patient is unable to independently self-manage chronic health conditions       Plan: The patient has  been provided with contact information for the care management team and has been advised to call with any health related questions or concerns.   Johnney Killian, RN, BSN, CCM Care Management Coordinator [facility] Medicine Phone: [phone]: [phone]

## 2021-03-04 NOTE — Addendum Note (Signed)
Addended byGaylan Gerold on: 03/04/2021 02:48 PM   Modules accepted: Orders

## 2021-03-04 NOTE — Patient Instructions (Signed)
Visit Information  Thank you for taking time to visit with me today. Please don't hesitate to contact me if I can be of assistance to you before our next scheduled telephone appointment.  Following are the goals we discussed today:  (Copy and paste patient goals from clinical care plan here)  Our next appointment is by telephone on 03/22/21 at 10:00  Please call the care guide team at [phone] if you need to cancel or reschedule your appointment.   If you are experiencing a Mental Health or [place] or need someone to talk to, please call the [place] National Suicide Prevention Lifeline: 620 253 1601 or TTY: [phone] TTY [phone]) to talk to a trained counselor   The patient verbalized understanding of instructions, educational materials, and care plan provided today and declined offer to receive copy of patient instructions, educational materials, and care plan.   The patient has been provided with contact information for the care management team and has been advised to call with any health related questions or concerns.   Johnney Killian, RN, BSN, CCM Care Management Coordinator [facility] Internal Medicine Phone: [phone]: [phone]

## 2021-03-04 NOTE — Telephone Encounter (Signed)
Left message to call back and [phone] on 03/04/2021.  Patient needs call back.

## 2021-03-07 NOTE — Telephone Encounter (Signed)
Left message for pt to call back 03/07/21

## 2021-03-08 ENCOUNTER — Ambulatory Visit: Payer: Self-pay

## 2021-03-08 NOTE — Chronic Care Management (AMB) (Signed)
   03/08/2021  Sharon Vasquez 1950-04-20 [id number]  Received incoming call from patient who shared she had been notified by [employer] that she needed to recert her need for her home oxygen.  Reviewed patient chart and she had a oxygen test done on 12/08/20.  Results below; CPAP machine: 2-3L O2 24/7 [name], [place] 06/18/2020 DME: [employer] for both cpap and oxygen SATURATION QUALIFICATIONS: (This note is used to comply with regulatory documentation for home oxygen) Patient Saturations on Room Air at Rest = 93%  Patient Saturations on Room Air while Ambulating = 87% Please briefly explain why patient needs home oxygen: Pt's O2 sat dropped when pt stood up  [employer] 12/08/20.   Information sent via message to Stephannie Peters who put in patient chart and this RNCM called patient back and let her know she should be set and to call back if she has any issues. Johnney Killian, RN, BSN, CCM Care Management Coordinator [facility] Phone: [phone]: [phone]

## 2021-03-09 ENCOUNTER — Other Ambulatory Visit (INDEPENDENT_AMBULATORY_CARE_PROVIDER_SITE_OTHER): Payer: Medicare Other

## 2021-03-09 DIAGNOSIS — I10 Essential (primary) hypertension: Secondary | ICD-10-CM | POA: Diagnosis not present

## 2021-03-09 NOTE — Telephone Encounter (Signed)
   Name: Sharon Vasquez  DOB: [birth year]  [name]: [id number]   Primary Cardiologist: Quay Burow, MD  Chart reviewed as part of pre-operative protocol coverage. Patient was contacted 03/09/2021 in reference to pre-operative risk assessment for pending surgery as outlined below.  Sharon Vasquez was last seen on 12/20/20 by [name] NP.  Since that day, Sharon Vasquez has done well. She has CAD by CCTA in 2020 treated with medical therapy. She is unable to complete 4.0 METS, she is in a wheelchair. She does not require insulin and does not have a history of stroke. She understand she is at high risk for MACE, but does not have any unstable cardiac conditions.   Per our clinical pharmacist: Request is to hold Xarelto for 3 days prior to procedure. 3 day DOAC hold is not required prior to endoscopy, pt has normal renal function. Recommend holding Xarelto for 1-2 days max and resume as soon as safely possible.  Therefore, based on ACC/AHA guidelines, the patient would be at acceptable risk for the planned procedure without further cardiovascular testing.   The patient was advised that if she develops new symptoms prior to surgery to contact our office to arrange for a follow-up visit, and she verbalized understanding.  I will route this recommendation to the requesting party via Epic fax function and remove from pre-op pool. Please call with questions.  Tami Lin Anishka Bushard, PA 03/09/2021, 4:21 PM

## 2021-03-10 ENCOUNTER — Telehealth: Payer: Medicare Other | Admitting: Licensed Clinical Social Worker

## 2021-03-10 LAB — BMP8+ANION GAP
Anion Gap: 15 mmol/L (ref 10.0–18.0)
BUN/Creatinine Ratio: 17 (ref 12–28)
BUN: 14 mg/dL (ref 8–27)
CO2: 27 mmol/L (ref 20–29)
Calcium: 9 mg/dL (ref 8.7–10.3)
Chloride: 100 mmol/L (ref 96–106)
Creatinine, Ser: 0.82 mg/dL (ref 0.57–1.00)
Glucose: 131 mg/dL — ABNORMAL HIGH (ref 70–99)
Potassium: 3.5 mmol/L (ref 3.5–5.2)
Sodium: 142 mmol/L (ref 134–144)
eGFR: 76 mL/min/{1.73_m2} (ref >59)

## 2021-03-10 MED ORDER — POTASSIUM CHLORIDE CRYS ER 10 MEQ PO TBCR: 10.0000 meq | EXTENDED_RELEASE_TABLET | Freq: Two times a day (BID) | ORAL | 0 refills | Status: DC

## 2021-03-10 NOTE — Addendum Note (Signed)
Addended byGaylan Gerold on: 03/10/2021 12:18 PM   Modules accepted: Orders

## 2021-03-11 ENCOUNTER — Ambulatory Visit
Admission: RE | Admit: 2021-03-11 | Discharge: 2021-03-11 | Disposition: A | Discharge status: Home / Self Care | Payer: Medicare Other | Source: Ambulatory Visit | Attending: Internal Medicine | Admitting: Internal Medicine

## 2021-03-11 DIAGNOSIS — N6310 Unspecified lump in the right breast, unspecified quadrant: Secondary | ICD-10-CM

## 2021-03-11 HISTORY — PX: BREAST BIOPSY: SHX20

## 2021-03-15 NOTE — Progress Notes (Signed)
_0  ID: Sharon Vasquez, female    DOB: 1950-05-28, 70 y.o.   [name]: [id number]  Chief Complaint  Patient presents with   surgical clearence   Cough    Patient just found out she has breast cancer and has had a cough for 1 week with mucus     Referring provider: [name], DO  HPI: 70 year old female, current some day smoker.  Past medical history significant for COPD, RB-ILD, chronic respiratory failure with hypoxia, OSA, hypertension, A. fib, chronic anticoagulation, diastolic heart failure, aortic arthrosclerosis GERD, osteoporosis, chronic pain syndrome, obesity. [name].  Maintained on CPAP.  Amiodarone discontinued.  Smoking cessation encouraged.  Previous [facility] pulmonary encounter: Hospital course 04/10/2020-04/15/2020 Patient was admitted for acute hypoxic respiratory failure due to COPD exacerbation and respiratory bronchiolitis associated ILD.  Oxygen saturation was 70% upon EMS arrival.  She was started on BiPAP in ED.  She had severe diffuse wheezing.  She was treated for acute COPD exacerbation with 5 days of azithromycin and steroids.  She was transitioned to nasal cannula 12 L which was gradually weaned down.  Discharged on home oxygen.  05/13/2020 Patient presents today for hospital follow-up.  Patient was admitted from 04/10/2020-04/15/2020 at [facility] for COPD exacerbation. She still gets out of breath with minimal exertion. Associated wheezing. She is complaint with Advair 250/50 and Spiriva handihaler. She has not been set up with home oxygen, needs concentrator.  Using CPAP fairly regularly. Has full face mask. States that she has not smoked in 1 week but still taking "puffs" of cigarettes. She had virtual visit with cardiology yesterday, no changes. She is off amiodarone. She is taking Torsemide 7m twice a day. Current weight 384lbs   Airview download 04/12/20-05/11/20: 25/30 days (83%); 21 days (70%) > 4 hours. Average usage days used 8 hours 31 mins.  Pressure 13 cm h20. Airleaks 11.0 median (102.4L/min). AHI 10.8   06/18/2020 - NP Patient contacted today for acute televisit. History significant for COPD, RB-ILD and OSA. Reports increased shortness of breath x 1 week. Associated nasal congestion and sneezing. No significant cough. She reports compliance with Spiriva handihaler. Using 2-3L oxygen 24/7. She is on Xarelto for afib. She ran out of Advair several days ago, needs refill. She has not been compliant with her CPAP. Wearing her oxygen at night only. We will place an order for DME company to help her sort this out.   08/03/20- Dr. AElsworth Soho 70yo morbidly obese smoker for follow-up of ILD and OSA She smoked up to 2 packs/day,  now decreased to about 4-5 cigarettes daily   PMH - chronic diastolic heart failure.  She has a history of pulmonary embolism/ LLE DVT 2015  on Xarelto  s/p venoplasty and stenting of left common iliac and external iliac vein Duplex 05/2017 did not show any residual stenosis. -on Xarelto for chronic atrial fibrillation , amiodarone stopped 10/2019  06/2020 tele visit - advair resumed  She complains of increased shortness of breath, NYHA class III including routine activities around the house.  She had an ED visit 4/23 where she was noted to be wheezing bilateral BNP 128 troponin negative, chest x-ray suggested interstitial prominence, given Lasix and prednisone taper. She is mildly improved She arrives in a motorized wheelchair today, trying to stay active.  She continues to smoke a pack lasts her a few weeks   12/08/2020 Patient presents today for 4 month follow-up. History significant for COPD, RB-ILD and OSA. Her breathing  is alright. She is maintained on Advair 250 and Spiriva handihaler. She rarely requires her albuterol rescue inhaler, on averge once every 2 weeks. She wears 2L oxygen when doing ADLs at home and at night. She is not consistently using her CPAP d/t equipment issues. She needs oxygen connection for her  CPAP. DME company is [employer]. She is taking 71m Torsemide daily. She has chronic LE edema. Her weight is down, reports she weighted 364lbs yesterday at pain doctor. She is in a motorized wheelchair today. She is receiving physical therapy at home. She got a new walker yesterday. She continues to work on cutting back her smoking. She tells me that 3 cigarettes will last her all day, this is down from 2 packs.   Airview download 11/07/20-12/06/20 Usage 9/30 days (30%); 7 days (23%) > 4 hours Average usage days used 5 hours 46 mins Pressure 13cm h20 Airleaks 33.5L/min (95%) AHI 4.7    03/16/2021- Interim hx  Patient presents today for surgical risk assessment. She is here for surgical risk assessment for esophagogastroduodenoscopy with Dr. BAlessandra Bevels She can not eat more than 2-3 spoon fulls without feeling full. She does not want to eat. She has lost 30 lbs in the last month. She was recently dx with breast cancer, she will be seeing breast center next week. She notes increased shortness of breath with associated chest tightness, wheezing and cough. She is compliant with Advair 250 and Spiriva. She wears 2L oxygen with exertion when needed and at night with CPAP. She reports compliance with CPAP at night, resmed aEstée Laudernot available. She wakes up every hour. She takes gabapentin and oxycodone at bedtime and still has a hard time sleeping. States that her legs hurt her all night. She is smoking less, using nicotine patches.    No Known Allergies  Immunization History  Administered Date(s) Administered   Moderna Sars-Covid-2 Vaccination 08/20/2019, 09/11/2019   PFIZER(Purple Top)SARS-COV-2 Vaccination 03/17/2020   Pneumococcal Conjugate-13 02/01/2017   Pneumococcal Polysaccharide-23 02/12/2020   Tdap 06/05/2019    Past Medical History:  Diagnosis Date   A-fib ([facility]    Anxiety    Arthritis    "qwhre; joints, back" (04/17/2017)   Benign breast cyst in female, left 01/08/2017   Found by  Screening mammogram, evaluated by U/S on 01/08/17 and determined to be a benign simple breast cyst.   Cellulitis of left lower leg 05/30/2017   CHF (congestive heart failure) ([facility] [place]    Chronic low back pain 08/21/2016   Chronic lower back pain    Chronic venous insufficiency    /notes 05/30/2017   COPD (chronic obstructive pulmonary disease) ([facility])    Depression    DV[place]deep venous thrombosis) ([facility] [place] 11/16/2016   GERD (gastroesophageal reflux disease)    Headache    "weekly for the last 3 months" (04/17/2017)   Hyperlipidemia    Hypertension    Morbid [place]    PE (pulmonary embolism)    Pulmonary [place]ism ([facility] 09/21/2014   Sleep apnea     Tobacco History: Social History   Tobacco Use  Smoking Status Every Day   Packs/day: 2.00   Years: 55.00   Pack years: 110.00   Types: Cigarettes   Start date: 08/16/1961   Last attempt to quit: 05/03/2020   Years since quitting: 0.8  Smokeless Tobacco Never   Ready to quit: Not Answered Counseling given: Not Answered   Outpatient Medications Prior to Visit  Medication Sig Dispense Refill   albuterol (VENTOLIN HFA) 108 (  90 Base) MCG/ACT inhaler Inhale into the lungs.     aspirin (ASPIRIN CHILDRENS) 81 MG chewable tablet 1 tablet DAILY (route: oral)     aspirin EC 81 MG tablet Take 81 mg by mouth daily.     atorvastatin (LIPITOR) 80 MG tablet TAKE 1 TABLET(80 MG) BY MOUTH DAILY 90 tablet 0   diclofenac Sodium (VOLTAREN) 1 % GEL Apply 2 g topically 4 (four) times daily. 100 g 1   esomeprazole (NEXIUM) 40 MG capsule Take 1 capsule (40 mg total) by mouth daily as needed (for heartburn or indigestion). (Patient taking differently: Take 40 mg by mouth daily.) 90 capsule 1   gabapentin (NEURONTIN) 400 MG capsule TAKE 1 CAPSULE BY MOUTH  TWICE DAILY 180 capsule 3   ipratropium-albuterol (DUONEB) 0.5-2.5 (3) MG/3ML SOLN Take 3 mLs by nebulization every 6 (six) hours as needed. (Patient taking differently: Take 3 mLs by nebulization every 6 (six)  hours as needed (shortness of breath/wheezing).) 360 mL 3   isosorbide mononitrate (IMDUR) 30 MG 24 hr tablet TAKE 1 TABLET(30 MG) BY MOUTH DAILY 90 tablet 0   metolazone (ZAROXOLYN) 2.5 MG tablet Take 1 tablet (2.5 mg total) by mouth every other day. 30 tablet 1   metoprolol succinate (TOPROL XL) 25 MG 24 hr tablet Take 1 tablet (25 mg total) by mouth daily. 90 tablet 0   mirabegron ER (MYRBETRIQ) 50 MG TB24 tablet Take 1 tablet (50 mg total) by mouth daily. 90 tablet 1   oxyCODONE (ROXICODONE) 15 MG immediate release tablet Take 15 mg by mouth 4 (four) times daily as needed for pain.     OXYGEN Inhale 2 L into the lungs as needed (shortness of breath).     potassium chloride (KLOR-CON M) 10 MEQ tablet Take 1 tablet (10 mEq total) by mouth 2 (two) times daily. 180 tablet 0   PRESCRIPTION MEDICATION Inhale into the lungs at bedtime. cpap     torsemide (DEMADEX) 20 MG tablet Take 2 tablets by mouth daily, you may take an extra 1/2 tablet only as needed for weight gain of 2 lbs overnight or 5 lbs in a week 225 tablet 1   XARELTO 20 MG TABS tablet TAKE 1 TABLET(20 MG) BY MOUTH EVERY MORNING 90 tablet 3   fluticasone-salmeterol (ADVAIR) 250-50 MCG/ACT AEPB Inhale 1 puff into the lungs in the morning and at bedtime. 60 each 2   tiotropium (SPIRIVA HANDIHALER) 18 MCG inhalation capsule PLACE 1 CAPSULE INTO INHALER AND INHALE DAILY 90 capsule 1   atorvastatin (LIPITOR) 40 MG tablet SMARTSIG:1 Tablet(s) By Mouth Every Evening     potassium chloride SA (KLOR-CON M) 20 MEQ tablet Take 2 tablets (40 mEq total) by mouth 2 (two) times daily for 6 days. 24 tablet 0   No facility-administered medications prior to visit.   Review of Systems  Review of Systems  Constitutional: Negative.   HENT: Negative.    Respiratory:  Positive for cough, chest tightness, shortness of breath and wheezing.   Cardiovascular:  Positive for leg swelling.    Physical Exam  BP 130/88    Pulse 98    Resp 18    Ht _0  (1.88  m)    Wt (!) 369 lb (167.4 kg)    SpO2 99%    BMI 47.38 kg/m  Physical Exam Constitutional:      General: She is not in acute distress.    Appearance: Normal appearance. She is obese. She is not ill-appearing.  Cardiovascular:  Rate and Rhythm: Normal rate and regular rhythm.     Comments: +3-4 BLE edema  Pulmonary:     Effort: Pulmonary effort is normal.     Breath sounds: Normal breath sounds.  Neurological:     General: No focal deficit present.     Mental Status: She is alert and oriented to person, place, and time. Mental status is at baseline.  Psychiatric:        Mood and Affect: Mood normal.        Behavior: Behavior normal.        Thought Content: Thought content normal.        Judgment: Judgment normal.     Lab Results:  CBC    Component Value Date/Time   WBC 6.0 10/07/2020 2044   RBC 4.57 10/07/2020 2044   HGB 13.0 10/07/2020 2044   HGB 12.4 04/29/2018 1235   HCT 39.6 10/07/2020 2044   HCT 37.2 04/29/2018 1235   PLT 364 10/07/2020 2044   PLT 389 04/29/2018 1235   MCV 86.7 10/07/2020 2044   MCV 86 04/29/2018 1235   MCH 28.4 10/07/2020 2044   MCHC 32.8 10/07/2020 2044   RDW 14.2 10/07/2020 2044   RDW 13.4 04/29/2018 1235   LYMPHSABS 2.4 10/07/2020 2044   MONOABS 0.3 10/07/2020 2044   EOSABS 0.1 10/07/2020 2044   BASOSABS 0.0 10/07/2020 2044    BMET    Component Value Date/Time   NA 142 03/09/2021 1024   K 3.5 03/09/2021 1024   CL 100 03/09/2021 1024   CO2 27 03/09/2021 1024   GLUCOSE 131 (H) 03/09/2021 1024   GLUCOSE 119 (H) 10/08/2020 0600   BUN 14 03/09/2021 1024   CREATININE 0.82 03/09/2021 1024   CREATININE 1.21 (H) 07/26/2016 1528   CALCIUM 9.0 03/09/2021 1024   GFRNONAA >60 10/08/2020 0600   GFRAA 79 10/23/2019 0924    BNP    Component Value Date/Time   BNP 75.0 10/07/2020 2044    ProBNP No results found for: PROBNP  Imaging: DG UGI W SINGLE CM (SOL OR THIN BA)  Result Date: 02/16/2021 CLINICAL DATA:  Esophageal dysphagia.  EXAM: UPPER GI SERIES WITH KUB TECHNIQUE: After obtaining a scout radiograph a routine upper GI series was performed using thin barium FLUOROSCOPY TIME:  Fluoroscopy Time:  3 minutes, 12 seconds Radiation Exposure Index (if provided by the fluoroscopic device): 54.8 mGy Number of Acquired Spot Images: 0 COMPARISON:  None. FINDINGS: The patient has very limited mobility related to body habitus and pain. She is not able to stand. She is not able to lay flat for prolonged period of time. Turning is difficult and painful for her. These limit the utility of today's fluoroscopic assessment. Initial KUB demonstrates a vascular stent along the upper pelvis. On the pharyngeal phase of swallowing, there is an episode of laryngeal penetration without frank tracheal aspiration. Primary peristaltic waves were disrupted in the mid esophagus on all swallows compatible with dysmotility. No esophageal dilatation. The patient had difficulty taking the swallows due to nausea. A 13 mm barium tablet got stuck in the midesophagus and did not progress further during observation, probably related to the dysmotility. I was never able to fully distend the distal esophagus, and the tapered appearance of the distal esophagus for example on image 44 of series 9 is such that distal esophageal narrowing cannot be readily excluded. Assessment of the stomach and duodenum is highly limited, but no gastric dilatation or gross gastric abnormality was observed. If detailed assessment  of the stomach is warranted, consider CT of the upper abdomen. IMPRESSION: 1. Limited exam due to patient body habitus and discomfort. 2. Nonspecific esophageal dysmotility disorder. 3. I was not able to fully distend the distal esophagus. There is stasis of the 13 mm barium tablet in the mid esophagus due to dysmotility. 4. Very limited assessment of the stomach, more detailed assessment of the stomach and duodenum is warranted consider CT of the upper abdomen.  Electronically Signed   By: Van Clines M.D.   On: 02/16/2021 13:20   US BREAST LTD UNI RIGHT INC AXILLA  Result Date: 03/02/2021 CLINICAL DATA:  Nonfocal pain in the medial aspects of both breasts for the past 3 months. This was constant for the 1st 2 weeks and has been intermittent since. She is wheelchair-bound and did not bring her oxygen for today's evaluation. EXAM: DIGITAL DIAGNOSTIC BILATERAL MAMMOGRAM WITH TOMOSYNTHESIS AND CAD; ULTRASOUND RIGHT BREAST LIMITED TECHNIQUE: Bilateral digital diagnostic mammography and breast tomosynthesis was performed. The images were evaluated with computer-aided detection.; Targeted ultrasound examination of the right breast was performed COMPARISON:  Previous exam(s). ACR Breast Density Category b: There are scattered areas of fibroglandular density. FINDINGS: There is a suggestion of and irregular mass in the anterior aspect of the right breast laterally. There are also calcifications more posteriorly in the mid anterior right breast laterally in an area of a possible 2nd mass suspected on the initial mammogram images. No mass was confirmed at that location with spot compression or true lateral images. An 8 mm rounded, circumscribed mass is demonstrated in thea mid medial right breast in approximately the 2 o'clock position. No findings suspicious for malignancy in the left breast. On physical exam, the patient has an approximately 1.5 cm palpable mass in the 10 o'clock position of the right breast, 3 cm from the nipple. No mass is palpable more posteriorly and laterally in that region of the breast. There are no palpable right axillary lymph nodes. Targeted ultrasound is performed, showing a 1.7 x 1.1 x 0.9 cm irregular, hypoechoic mass in the 10 o'clock position of the right breast, 3 cm from the nipple. This corresponds to the more anterior mammographic mass. A 0.9 x 0.8 x 0.7 cm similar-appearing mass is demonstrated in the 10 o'clock position of the right  breast, 6 cm from the nipple, corresponding to the 2nd mass seen mammographically. An 8 x 5 x 4 mm cyst with mildly thickened internal septations without nodularity is demonstrated in the 2 o'clock position of the right breast, 10 cm from the nipple. No internal blood flow was seen with power Doppler. This corresponds to the medial mammographic mass. Ultrasound of the right axilla was somewhat difficult due to the large size of the patient with a few normal appearing right axillary lymph nodes visualized. IMPRESSION: 1. 1.7 cm mass with imaging features highly suspicious for malignancy in the 10 o'clock position of the right breast, 3 cm from the nipple. 2. 0.9 cm similar appearing mass with imaging features highly suspicious for malignancy in the 10 o'clock position of the right breast, 6 cm from the nipple. 3. No evidence of malignancy elsewhere in either breast and no right axillary adenopathy visualized. RECOMMENDATION: Ultrasound-guided core needle biopsies of the 1.7 cm and 0.9 cm mass in the 10 o'clock position of the right breast. This has been discussed the patient and scheduled at 2:45 p.m. on 03/11/2021. I have discussed the findings and recommendations with the patient. If applicable, a reminder letter will  be sent to the patient regarding the next appointment. BI-RADS CATEGORY  5: Highly suggestive of malignancy. Electronically Signed   By: Claudie Revering M.D.   On: 03/02/2021 17:20  MM DIAG BREAST TOMO BILATERAL  Result Date: 03/02/2021 CLINICAL DATA:  Nonfocal pain in the medial aspects of both breasts for the past 3 months. This was constant for the 1st 2 weeks and has been intermittent since. She is wheelchair-bound and did not bring her oxygen for today's evaluation. EXAM: DIGITAL DIAGNOSTIC BILATERAL MAMMOGRAM WITH TOMOSYNTHESIS AND CAD; ULTRASOUND RIGHT BREAST LIMITED TECHNIQUE: Bilateral digital diagnostic mammography and breast tomosynthesis was performed. The images were evaluated with  computer-aided detection.; Targeted ultrasound examination of the right breast was performed COMPARISON:  Previous exam(s). ACR Breast Density Category b: There are scattered areas of fibroglandular density. FINDINGS: There is a suggestion of and irregular mass in the anterior aspect of the right breast laterally. There are also calcifications more posteriorly in the mid anterior right breast laterally in an area of a possible 2nd mass suspected on the initial mammogram images. No mass was confirmed at that location with spot compression or true lateral images. An 8 mm rounded, circumscribed mass is demonstrated in thea mid medial right breast in approximately the 2 o'clock position. No findings suspicious for malignancy in the left breast. On physical exam, the patient has an approximately 1.5 cm palpable mass in the 10 o'clock position of the right breast, 3 cm from the nipple. No mass is palpable more posteriorly and laterally in that region of the breast. There are no palpable right axillary lymph nodes. Targeted ultrasound is performed, showing a 1.7 x 1.1 x 0.9 cm irregular, hypoechoic mass in the 10 o'clock position of the right breast, 3 cm from the nipple. This corresponds to the more anterior mammographic mass. A 0.9 x 0.8 x 0.7 cm similar-appearing mass is demonstrated in the 10 o'clock position of the right breast, 6 cm from the nipple, corresponding to the 2nd mass seen mammographically. An 8 x 5 x 4 mm cyst with mildly thickened internal septations without nodularity is demonstrated in the 2 o'clock position of the right breast, 10 cm from the nipple. No internal blood flow was seen with power Doppler. This corresponds to the medial mammographic mass. Ultrasound of the right axilla was somewhat difficult due to the large size of the patient with a few normal appearing right axillary lymph nodes visualized. IMPRESSION: 1. 1.7 cm mass with imaging features highly suspicious for malignancy in the 10  o'clock position of the right breast, 3 cm from the nipple. 2. 0.9 cm similar appearing mass with imaging features highly suspicious for malignancy in the 10 o'clock position of the right breast, 6 cm from the nipple. 3. No evidence of malignancy elsewhere in either breast and no right axillary adenopathy visualized. RECOMMENDATION: Ultrasound-guided core needle biopsies of the 1.7 cm and 0.9 cm mass in the 10 o'clock position of the right breast. This has been discussed the patient and scheduled at 2:45 p.m. on 03/11/2021. I have discussed the findings and recommendations with the patient. If applicable, a reminder letter will be sent to the patient regarding the next appointment. BI-RADS CATEGORY  5: Highly suggestive of malignancy. Electronically Signed   By: Claudie Revering M.D.   On: 03/02/2021 17:20  MM CLIP PLACEMENT RIGHT  Result Date: 03/11/2021 CLINICAL DATA:  Status post 2 ultrasound-guided biopsies (2 RIGHT breast masses) today. EXAM: 3D DIAGNOSTIC RIGHT MAMMOGRAM POST ULTRASOUND BIOPSY x2 COMPARISON:  Previous exam(s). FINDINGS: 3D Mammographic images were obtained following ultrasound guided biopsy of the RIGHT breast mass at the 10 o'clock axis, 3 cm from the nipple and ultrasound-guided biopsy of the RIGHT breast mass at the 10 o'clock axis, 6 cm from nipple. The biopsy marking clips are in expected position at the sites of biopsy. IMPRESSION: 1. Appropriate positioning of the ribbon shaped biopsy marking clip at the site of biopsy in the upper-outer quadrant of the RIGHT breast corresponding to the targeted mass at the 10 o'clock axis, 3 cm from the nipple. 2. Appropriate positioning of the coil shaped biopsy marking clip at the site of biopsy in the upper-outer quadrant of the RIGHT breast corresponding to the targeted mass at the 10 o'clock axis, 6 cm from the nipple. 3. Postprocedure hematoma is noted. Quick clot and compression bandages applied. Hemostasis obtained prior to discharge. Final  Assessment: Post Procedure Mammograms for Marker Placement Electronically Signed   By: Franki Cabot M.D.   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 This was found to be concordant by Dr. Franki Cabot. Pathology revealed GRADE II INVASIVE DUCTAL CARCINOMA, DUCTAL CARCINOMA IN SITU of the RIGHT breast, 10:00 o'clock 6cm from nipple, (coil clip). This was found to be concordant by Dr. Franki Cabot. Pathology results were discussed with the patient by telephone. The patient reported doing well after the biopsies with tenderness at the sites. Post biopsy instructions and care were reviewed and questions were answered. The patient was encouraged to call [facility] for any additional concerns. My direct phone number was provided. The patient was referred to [facility] on March 23, 2021. Pathology results reported by Terie Purser, RN on 03/15/2021. Electronically Signed   By: Franki Cabot M.D.   On: 03/15/2021 14:48   Result Date: 03/15/2021 CLINICAL DATA:  Patient with 2 masses in the RIGHT breast at the 10 o'clock axis, 3 cm from the nipple and 6 cm from the nipple respectively, measuring 1.7 cm and 0.9 cm. Both masses are scheduled for ultrasound-guided biopsy today. EXAM: ULTRASOUND GUIDED RIGHT BREAST CORE NEEDLE BIOPSY x2 COMPARISON:  Previous exam(s). PROCEDURE: I met with the patient and we discussed the procedure of ultrasound-guided biopsy, including benefits and alternatives. We discussed the high likelihood of a successful procedure. We discussed the risks of the procedure, including infection, bleeding, tissue injury, clip migration, and inadequate sampling.  Informed written consent was given. The usual time-out protocol was performed immediately prior to the procedure. Site 1: Lesion quadrant: Upper outer quadrant Using sterile technique and 1% Lidocaine as local anesthetic, under direct ultrasound visualization, a 12 gauge spring-loaded device was used to perform biopsy of the RIGHT breast mass at the 10 o'clock axis, 3 cm from the nipple, using a lateral approach. At the conclusion of the procedure ribbon shaped tissue marker clip was deployed into the biopsy cavity. Site 2: Lesion quadrant: Upper outer quadrant Using sterile technique and 1% Lidocaine as local anesthetic, under direct ultrasound visualization, a 12 gauge spring-loaded device was used to perform biopsy of the RIGHT breast mass at the 10 o'clock axis, 6 cm from nipple using a lateral approach. At the conclusion of the procedure coil  shaped tissue marker clip was deployed into the biopsy cavity. Follow up 2 view mammogram was performed and dictated separately. Follow up 2 view mammogram was performed and dictated separately. IMPRESSION: 1. Ultrasound guided biopsy of the RIGHT breast mass at the 10 o'clock axis, 3 cm from the nipple. Ribbon shaped clip placed at the biopsy site. 2. Ultrasound guided biopsy of the RIGHT breast mass at the 10 o'clock axis, 6 cm from the nipple. Coil shaped clip placed at the biopsy site. 3. Significant postprocedural bleeding. Hemostasis was obtained after compression and Quick clot application. Compression bandages were applied at discharge. Electronically Signed: By: Franki Cabot M.D. 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 This was found to be concordant by Dr. Franki Cabot. Pathology revealed GRADE II INVASIVE DUCTAL CARCINOMA,  DUCTAL CARCINOMA IN SITU of the RIGHT breast, 10:00 o'clock 6cm from nipple, (coil clip). This was found to be concordant by Dr. Franki Cabot. Pathology results were discussed with the patient by telephone. The patient reported doing well after the biopsies with tenderness at the sites. Post biopsy instructions and care were reviewed and questions were answered. The patient was encouraged to call [facility] for any additional concerns. My direct phone number was provided. The patient was referred to [facility] on March 23, 2021. Pathology results reported by Terie Purser, RN on 03/15/2021. Electronically Signed   By: Franki Cabot M.D.   On: 03/15/2021 14:48   Result Date: 03/15/2021 CLINICAL DATA:  Patient with 2 masses in the RIGHT breast at the 10 o'clock axis, 3 cm from the nipple and 6 cm from the nipple respectively, measuring 1.7 cm and 0.9 cm. Both masses are scheduled for ultrasound-guided biopsy today. EXAM: ULTRASOUND GUIDED RIGHT BREAST CORE NEEDLE BIOPSY x2 COMPARISON:  Previous exam(s). PROCEDURE: I met with the patient and we discussed the procedure of ultrasound-guided biopsy, including benefits and alternatives. We discussed the high likelihood of a successful procedure. We discussed the risks of the procedure, including infection, bleeding, tissue injury, clip migration, and inadequate sampling. Informed written consent was given. The usual time-out protocol was performed immediately prior to the procedure. Site 1: Lesion quadrant: Upper outer quadrant Using sterile technique and 1% Lidocaine as local anesthetic, under direct ultrasound visualization, a 12 gauge spring-loaded device was used to perform biopsy of the RIGHT breast mass at the 10 o'clock axis, 3 cm from the nipple, using a lateral approach. At the conclusion of the procedure ribbon shaped tissue marker clip was deployed into  the biopsy cavity. Site 2: Lesion quadrant: Upper outer quadrant Using sterile technique and 1% Lidocaine as local anesthetic, under direct ultrasound visualization, a 12 gauge spring-loaded device was used to perform biopsy of the RIGHT breast mass at the 10 o'clock axis, 6 cm from nipple using a lateral approach. At the conclusion of the procedure coil shaped tissue marker clip was deployed into the biopsy cavity. Follow up 2 view mammogram was performed and dictated separately. Follow up 2 view mammogram was performed and dictated separately. IMPRESSION: 1. Ultrasound guided biopsy of the RIGHT breast mass at the 10 o'clock axis, 3 cm from the nipple. Ribbon shaped clip placed at the  biopsy site. 2. Ultrasound guided biopsy of the RIGHT breast mass at the 10 o'clock axis, 6 cm from the nipple. Coil shaped clip placed at the biopsy site. 3. Significant postprocedural bleeding. Hemostasis was obtained after compression and Quick clot application. Compression bandages were applied at discharge. Electronically Signed: By: Franki Cabot M.D. On: 03/11/2021 16:01     Assessment & Plan:   Acute exacerbation of chronic obstructive pulmonary disease (COPD) ([place]) Patient reports increased shortness of breath with associated chest tightness, wheezing and cough. Sending in RX prednisone taper and trial Trelegy 144mg one puff daily. Needs pre-op CXR today and also needs repeat PFTs prior to surgical clearance. FU with Dr. AElsworth Sohofirst in January.   Recommendations: - Trial Trelegy 1069m one puff daily in the morning (hold Advair and Spiriva while using sample) - Take prednisone taper as directed   Pre-operative respiratory examination Patient will be considered intermediate-high risk for prolonged mech ventilation and/or post op pulmonary complications d/t hx COPD, OSA, chronic respiratory failure, diastolic heart failure and morbid obesity. Ultimate clearance will be decided by surgeon. Recommend surgical  procedure be done in hospital setting with anesthesiology. Needs CXR and PFTs prior to clearance, follow-up with Dr. AlElsworth Sohorior to surgery.   Recommend 1. Short duration of surgery as much as possible and avoid paralytic if possible 2. Recovery in step down or ICU with Pulmonary consultation if needed 3. DVT prophylaxis 4. Aggressive pulmonary toilet with o2, bronchodilatation, and incentive spirometry and early ambulation  OSA (obstructive sleep apnea) - No download available, contacted [employer] and patient was added to resmed airview. Patient is 77% compliant with CPAP > 4 hours last 30 days. Pressure 13cm h20, residual 8.5/hr. Airleaks 68L/min.   Chronic respiratory failure with hypoxia (HCC) - Continue to wear supplemental Oxygen 2L with moderate exertion to maintain O2 >88-90% and at bedtime with CPAP    1) RISK FOR PROLONGED MECHANICAL VENTILAION - > 48h  1A) Arozullah - Prolonged mech ventilation risk Arozullah Postperative Pulmonary Risk Score - for mech ventilation dependence >48h (VFamily Dollar StoresAnn Surg 2000, major non-cardiac surgery) Comment Score  Type of surgery - abd ao aneurysm (27), thoracic (21), neurosurgery / upper abdominal / vascular (21), neck (11) esophagogastroduodenoscopy  6  Emergency Surgery - (11)  0  ALbumin < 3 or poor nutritional state - (9)  0  BUN > 30 -  (8)  0  Partial or completely dependent functional status - (7)  7  COPD -  (6)  6  Age - 60 to 69 (4), > 70  (6)  6  TOTAL  25  Risk Stratifcation scores  - < 10 (0.5%), 11-19 (1.8%), 20-27 (4.2%), 28-40 (10.1%), >40 (26.6%)  4.2%    1B) GUPTA - Prolonged Mech Vent Risk Score source Risk  Guptal post op prolonged mech ventilation > 48h or reintubation < 30 days - ACS 2007-2008 dataset - hthttp://lewis-perez.[email or web address] % Risk of mechanical ventilation for >48 hrs after surgery, or unplanned intubation ?30 days of surgery    2) RISK FOR POST OP  PNEUMONIA Score source Risk  GuLyndel Safe Post Op Pnemounia risk  htTonerProviders.[email or web address] % Risk of postoperative pneumonia    R3) ISK FOR ANY POST-OP PULMONARY COMPLICATION Score source Risk  CANET/ARISCAT Score - risk for ANY/ALl pulmonary complications - > risk of in-hospital post-op pulmonary complications (composite including respiratory failure, respiratory infection, pleural effusion, atelectasis, pneumothorax, bronchospasm, aspiration pneumonitis) htSocietyMagazines.ca based on age, anemia, pulse ox, resp infection prior  30d, incision site, duration of surgery, and emergency v elective surgery Low risk 1.6% risk of in-hospital post-op pulmonary complications (composite including respiratory failure, respiratory infection, pleural effusion, atelectasis, pneumothorax, bronchospasm, aspiration pneumonitis)     Martyn Ehrich, NP 03/16/2021

## 2021-03-16 ENCOUNTER — Telehealth: Payer: Self-pay | Admitting: Hematology and Oncology

## 2021-03-16 ENCOUNTER — Other Ambulatory Visit: Payer: Self-pay

## 2021-03-16 ENCOUNTER — Ambulatory Visit (INDEPENDENT_AMBULATORY_CARE_PROVIDER_SITE_OTHER): Payer: Medicare Other | Admitting: Primary Care

## 2021-03-16 ENCOUNTER — Ambulatory Visit (INDEPENDENT_AMBULATORY_CARE_PROVIDER_SITE_OTHER): Payer: Medicare Other

## 2021-03-16 ENCOUNTER — Encounter: Payer: Self-pay | Admitting: Primary Care

## 2021-03-16 VITALS — BP 130/88 | HR 98 | Resp 18 | Ht 74.0 in | Wt 369.0 lb

## 2021-03-16 DIAGNOSIS — G4733 Obstructive sleep apnea (adult) (pediatric): Secondary | ICD-10-CM | POA: Diagnosis not present

## 2021-03-16 DIAGNOSIS — Z01811 Encounter for preprocedural respiratory examination: Secondary | ICD-10-CM

## 2021-03-16 DIAGNOSIS — J441 Chronic obstructive pulmonary disease with (acute) exacerbation: Secondary | ICD-10-CM

## 2021-03-16 DIAGNOSIS — R0602 Shortness of breath: Secondary | ICD-10-CM

## 2021-03-16 DIAGNOSIS — J9611 Chronic respiratory failure with hypoxia: Secondary | ICD-10-CM

## 2021-03-16 MED ORDER — TRELEGY ELLIPTA 100-62.5-25 MCG/ACT IN AEPB: 2.0000 | INHALATION_SPRAY | Freq: Once | RESPIRATORY_TRACT | 0 refills | Status: AC

## 2021-03-16 MED ORDER — GUAIFENESIN-DM 100-10 MG/5ML PO SYRP: 5.0000 mL | ORAL_SOLUTION | ORAL | 0 refills | Status: DC | PRN

## 2021-03-16 MED ORDER — PREDNISONE 10 MG PO TABS: ORAL_TABLET | ORAL | 0 refills | Status: DC

## 2021-03-16 NOTE — Telephone Encounter (Signed)
Spoke with patient to confirm morning clinic appointment for 12/21, packet sent via mail

## 2021-03-16 NOTE — Assessment & Plan Note (Signed)
-   Continue to wear supplemental Oxygen 2L with moderate exertion to maintain O2 >88-90% and at bedtime with CPAP

## 2021-03-16 NOTE — Patient Instructions (Addendum)
You will be considered high risk for prolonged mech ventilation and/or post op pulmonary complications. Needs CXR and PFTs prior to clearance, follow-up with Dr. Elsworth Soho prior to surgery   Recommend 1. Short duration of surgery as much as possible and avoid paralytic if possible 2. Recovery in step down or ICU with Pulmonary consultation IF NEEDED  3. DVT prophylaxis 4. Aggressive pulmonary toilet with o2, bronchodilatation, and incentive spirometry and early ambulation   Recommendations: - Trial Trelegy 12mcg one puff daily in the morning (hold Advair and Spiriva while using sample) - Take prednisone taper as directed  - Continue to wear CPAP every night for 4-6 hours or longer - Wear 2L oxygen with exertion to maintain O2 >88-90% and at bedtime with CPAP   Orders: - PFTs first available - CXR today pre-op clearance (today-ordered)  Follow-up: - Wednesday Dec 21st at 10am for PFTs  - Apt on Jan 5th or 6th with Dr. Elsworth Soho (pre-op)

## 2021-03-16 NOTE — Assessment & Plan Note (Signed)
Patient will be considered intermediate-high risk for prolonged mech ventilation and/or post op pulmonary complications d/t hx COPD, OSA, chronic respiratory failure, diastolic heart failure and morbid obesity. Ultimate clearance will be decided by surgeon. Recommend surgical procedure be done in hospital setting with anesthesiology. Needs CXR and PFTs prior to clearance, follow-up with Dr. Elsworth Soho prior to surgery.   Recommend 1. Short duration of surgery as much as possible and avoid paralytic if possible 2. Recovery in step down or ICU with Pulmonary consultation if needed 3. DVT prophylaxis 4. Aggressive pulmonary toilet with o2, bronchodilatation, and incentive spirometry and early ambulation

## 2021-03-16 NOTE — Assessment & Plan Note (Signed)
-   No download available, contacted [employer] and patient was added to resmed airview. Patient is 77% compliant with CPAP > 4 hours last 30 days. Pressure 13cm h20, residual 8.5/hr. Airleaks 68L/min.

## 2021-03-16 NOTE — Assessment & Plan Note (Signed)
Patient reports increased shortness of breath with associated chest tightness, wheezing and cough. Sending in RX prednisone taper and trial Trelegy 1103mcg one puff daily. Needs pre-op CXR today and also needs repeat PFTs prior to surgical clearance. FU with Dr. Elsworth Soho first in January.   Recommendations: - Trial Trelegy 132mcg one puff daily in the morning (hold Advair and Spiriva while using sample) - Take prednisone taper as directed

## 2021-03-17 ENCOUNTER — Telehealth: Payer: Self-pay

## 2021-03-17 NOTE — Telephone Encounter (Signed)
A RN for the home nurse service is requesting a call back .. she stated that pt is wanting to be discharged from the services    RN name is : Kenadi Miltner  # [phone]

## 2021-03-17 NOTE — Progress Notes (Signed)
Pre-op CXR showed stable chronic findings cardiomegaly and ILD. No acute findings. Still needs PFTs prior to surgical procedure

## 2021-03-17 NOTE — Telephone Encounter (Signed)
Returned call to [employer]. She is requesting VO to discontinue [place] skilled nursing as patient is refusing visits, not weighing herself, refusing leg wraps. Verbal auth given. Will route to [facility] for agreement/denial.

## 2021-03-21 ENCOUNTER — Telehealth: Payer: Self-pay | Admitting: Primary Care

## 2021-03-21 ENCOUNTER — Other Ambulatory Visit: Payer: Self-pay | Admitting: Student

## 2021-03-21 ENCOUNTER — Ambulatory Visit: Payer: Self-pay

## 2021-03-21 ENCOUNTER — Encounter: Payer: Self-pay | Admitting: *Deleted

## 2021-03-21 DIAGNOSIS — I5032 Chronic diastolic (congestive) heart failure: Secondary | ICD-10-CM

## 2021-03-21 DIAGNOSIS — Z170 Estrogen receptor positive status [ER+]: Secondary | ICD-10-CM

## 2021-03-21 MED ORDER — POTASSIUM CHLORIDE CRYS ER 20 MEQ PO TBCR: 20.0000 meq | EXTENDED_RELEASE_TABLET | Freq: Every day | ORAL | 1 refills | Status: DC

## 2021-03-21 NOTE — Chronic Care Management (AMB) (Signed)
° °  03/21/2021  Sharon Vasquez May 03, 1950 [id number]  Incoming call from patients explaining that the pharmacy has been out of the 47meq Potassium pills that she had been given and she has not had any potassium in several weeks.  Consulted with Dr. Alfonse Spruce and he was able to send a new prescription to patient's pharmacy.  Called patient back to notify of new prescription. Johnney Killian, RN, BSN, CCM Care Management Coordinator [facility] Internal Medicine Phone: [phone]: [phone]

## 2021-03-21 NOTE — Addendum Note (Signed)
Addended byGaylan Gerold on: 03/21/2021 09:24 AM   Modules accepted: Orders

## 2021-03-21 NOTE — Telephone Encounter (Signed)
Called and spoke with pt. Pt said she was not sure how often she was supposed to be taking the Trelegy inhaler. Stated to pt that it was one puff once a day and she verbalized understanding. Nothing further needed.

## 2021-03-22 ENCOUNTER — Ambulatory Visit: Payer: Medicare Other

## 2021-03-22 NOTE — Patient Instructions (Signed)
Visit Information  Thank you for taking time to visit with me today. Please don't hesitate to contact me if I can be of assistance to you before our next scheduled telephone appointment.  Our next appointment is by telephone on 04/19/21 at 1:00PM.  Please call the care guide team at [phone] if you need to cancel or reschedule your appointment.   If you are experiencing a Mental Health or [place] or need someone to talk to, please call the [place] National Suicide Prevention Lifeline: 806 110 1047 or TTY: 609 699 2145 TTY (660) 028-2928) to talk to a trained counselor   The patient verbalized understanding of instructions, educational materials, and care plan provided today and declined offer to receive copy of patient instructions, educational materials, and care plan.   The patient has been provided with contact information for the care management team and has been advised to call with any health related questions or concerns.   Johnney Killian, RN, BSN, CCM Care Management Coordinator [facility] Internal Medicine Phone: [phone]: [phone]

## 2021-03-22 NOTE — Chronic Care Management (AMB) (Signed)
Care Management    RN Visit Note  03/22/2021 Name: Sharon Vasquez [name]: [id number] DOB: 12-26-1950  Subjective: Sharon Vasquez is a 70 y.o. year old female who is a primary care patient of [place], Rayann N, DO. The care management team was consulted for assistance with disease management and care coordination needs.    Engaged with patient by telephone for follow up visit in response to provider referral for case management and/or care coordination services.   Consent to Services:   Sharon Vasquez was given information about Care Management services today including:  Care Management services includes personalized support from designated clinical staff supervised by her physician, including individualized plan of care and coordination with other care providers 24/7 contact phone numbers for assistance for urgent and routine care needs. The patient may stop case management services at any time by phone call to the office staff.  Patient agreed to services and consent obtained.   Assessment: Review of patient past medical history, allergies, medications, health status, including review of consultants reports, laboratory and other test data, was performed as part of comprehensive evaluation and provision of chronic care management services.   SDOH (Social Determinants of Health) assessments and interventions performed:    Care Plan  No Known Allergies  Outpatient Encounter Medications as of 03/22/2021  Medication Sig Note   potassium chloride SA (KLOR-CON M) 20 MEQ tablet Take 1 tablet (20 mEq total) by mouth daily.    albuterol (VENTOLIN HFA) 108 (90 Base) MCG/ACT inhaler Inhale into the lungs.    aspirin (ASPIRIN CHILDRENS) 81 MG chewable tablet 1 tablet DAILY (route: oral) 03/16/2021: Med Classification: Hematological Agents   aspirin EC 81 MG tablet Take 81 mg by mouth daily.    atorvastatin (LIPITOR) 40 MG tablet SMARTSIG:1 Tablet(s) By Mouth Every Evening    atorvastatin (LIPITOR) 80  MG tablet TAKE 1 TABLET(80 MG) BY MOUTH DAILY    diclofenac Sodium (VOLTAREN) 1 % GEL Apply 2 g topically 4 (four) times daily.    esomeprazole (NEXIUM) 40 MG capsule Take 1 capsule (40 mg total) by mouth daily as needed (for heartburn or indigestion). (Patient taking differently: Take 40 mg by mouth daily.)    gabapentin (NEURONTIN) 400 MG capsule TAKE 1 CAPSULE BY MOUTH  TWICE DAILY    guaiFENesin-dextromethorphan (ROBITUSSIN DM) 100-10 MG/5ML syrup Take 5 mLs by mouth every 4 (four) hours as needed for cough.    ipratropium-albuterol (DUONEB) 0.5-2.5 (3) MG/3ML SOLN Take 3 mLs by nebulization every 6 (six) hours as needed. (Patient taking differently: Take 3 mLs by nebulization every 6 (six) hours as needed (shortness of breath/wheezing).)    isosorbide mononitrate (IMDUR) 30 MG 24 hr tablet TAKE 1 TABLET(30 MG) BY MOUTH DAILY    metolazone (ZAROXOLYN) 2.5 MG tablet Take 1 tablet (2.5 mg total) by mouth every other day.    metoprolol succinate (TOPROL XL) 25 MG 24 hr tablet Take 1 tablet (25 mg total) by mouth daily.    mirabegron ER (MYRBETRIQ) 50 MG TB24 tablet Take 1 tablet (50 mg total) by mouth daily.    oxyCODONE (ROXICODONE) 15 MG immediate release tablet Take 15 mg by mouth 4 (four) times daily as needed for pain.    OXYGEN Inhale 2 L into the lungs as needed (shortness of breath).    potassium chloride SA (KLOR-CON M) 20 MEQ tablet Take 2 tablets (40 mEq total) by mouth 2 (two) times daily for 6 days.    predniSONE (DELTASONE) 10 MG tablet  4 tabs for 2 days, then 3 tabs for 2 days, 2 tabs for 2 days, then 1 tab for 2 days, then stop    PRESCRIPTION MEDICATION Inhale into the lungs at bedtime. cpap    torsemide (DEMADEX) 20 MG tablet Take 2 tablets by mouth daily, you may take an extra 1/2 tablet only as needed for weight gain of 2 lbs overnight or 5 lbs in a week    XARELTO 20 MG TABS tablet TAKE 1 TABLET(20 MG) BY MOUTH EVERY MORNING    No facility-administered encounter medications on  file as of 03/22/2021.    Patient Active Problem List   Diagnosis Date Noted   Malignant neoplasm of upper-outer quadrant of right breast in female, estrogen receptor positive ([place]) 03/21/2021   Pre-operative respiratory examination 03/16/2021   Right foot pain 03/03/2021   Breast pain, right 01/17/2021   Chronic heart failure with preserved ejection fraction (HFpEF) (HCC)    Paroxysmal atrial fibrillation (HCC)    Osteoarthritis of left hip 10/07/2020   Venous stasis ulcer of left lower extremity ([place]) 08/18/2020   Hyperlipidemia 05/14/2020   Esophageal dysmotility 11/10/2019   Chronic respiratory failure with hypoxia ([place]) 09/30/2019   Mixed incontinence, urge and stress (female) (female) 08/21/2019   Preventative health care 06/04/2019   GERD (gastroesophageal reflux disease) 01/20/2019   ILD (interstitial lung disease) ([place]) 02/13/2018   OSA (obstructive sleep apnea) 11/14/2017   Chronic pain syndrome 07/27/2017   Major depression, recurrent, chronic ([place]) 02/01/2017   Osteoporosis 12/14/2016   Drug induced constipation 08/21/2016   Aortic atherosclerosis ([place]) 07/17/2016   Chronic venous insufficiency 07/12/2016   Chronic anticoagulation 07/12/2016   Tobacco use 07/11/2016   Acute exacerbation of chronic obstructive pulmonary disease (COPD) (HCC)    Diastolic heart failure ([place])    History of pulmonary embolism    Atrial fibrillation with controlled ventricular response (HCC)    Vocal cord polyp 12/18/2015   Laryngopharyngeal reflux 12/18/2015   Morbid obesity ([place])    Peripheral vascular disease ([place])    Benign essential HTN    Primary osteoarthritis of both knees 09/16/2014    Conditions to be addressed/monitored: Afib, CHF and HTN, COPD  Care Plan : CCM RN- COPD (Adult)  Updates made by Johnney Killian, RN since 03/22/2021 12:00 AM     Problem: Symptom Exacerbation (COPD)      Goal: Symptom Exacerbation Prevented or Minimized   Start Date: 06/24/2019   Expected End Date: 01/30/2021  Recent Progress: On track  Priority: High  Note:   CARE PLAN ENTRY (see longitudinal plan of care for additional care plan information) Current Barriers: Successful outreach to patient this morning.  Patient had her breast biopsy and she was diagnosed with GRADE II INVASIVE DUCTAL CARCINOMA, DUCTAL CARCINOMA IN SITU of the RIGHT breast.  She is going to the oncologist next week.  She is also having a EGD with Propofol on 04/12/21.  She states when she eats she feels like she is choking, can only tolerate a few bites of food.  Patient is praying a lot and trying not to dwell on her cancer diagnosis. Knowledge Deficits related to plan of care for management of CHF, HTN, and COPD  Chronic Disease Management support and education needs related to CHF, HTN, and COPD  Lacks caregiver support Financial Constraints   RNCM Clinical Goal(s):  Patient will verbalize understanding of plan for management of CHF, HTN, and COPD as evidenced by continued discussions with provider and RNCM. demonstrate Ongoing adherence  to prescribed treatment plan for CHF, HTN, and COPD as evidenced by weight and hypertension monitoring. continue to work with RN Care Manager to address care management and care coordination needs related to  CHF, HTN, and COPD as evidenced by adherence to CM Team Scheduled appointments not experience hospital admission as evidenced by review of EMR. Hospital Admissions in last 6 months = 0  through collaboration with RN Care manager, provider, and care team.  Interventions: 1:1 collaboration with primary care provider regarding development and update of comprehensive plan of care as evidenced by provider attestation and co-signature Inter-disciplinary care team collaboration (see longitudinal plan of care) Evaluation of current treatment plan related to  self management and patient's adherence to plan as established by provider Heart Failure Interventions:   (Status:  Goal on track:  NO.) Long Term Goal Basic overview and discussion of pathophysiology of Heart Failure reviewed Discussed importance of daily weight and advised patient to weigh and record daily Discussed the importance of keeping all appointments with provider COPD Interventions:  (Status:  Goal on track:  NO.) Long Term Goal Advised patient to track and manage COPD triggers Provided instruction about proper use of medications used for management of COPD including inhalers Discussed the importance of adequate rest and management of fatigue with COPD Hypertension Interventions:  (Status:  Goal on track:  Yes.) Long Term Goal Last practice recorded BP readings:  BP Readings from Last 3 Encounters:  03/16/21 130/88  03/03/21 135/85  02/02/21 124/90  Most recent eGFR/CrCl:  Lab Results  Component Value Date   EGFR 76 03/09/2021    No components found for: CRCL  Evaluation of current treatment plan related to hypertension self management and patient's adherence to plan as established by provider Reviewed medications with patient and discussed importance of compliance Discussed plans with patient for ongoing care management follow up and provided patient with direct contact information for care management team Advised patient, providing education and rationale, to monitor blood pressure daily and record, calling PCP for findings outside established parameters Reviewed scheduled/upcoming provider appointments including:  Patient Goals/Self-Care Activities: Take all medications as prescribed Attend all scheduled provider appointments Call pharmacy for medication refills 3-7 days in advance of running out of medications Perform all self care activities independently  Perform IADL's (shopping, preparing meals, housekeeping, managing finances) independently Call provider office for new concerns or questions  call office if I gain more than 2 pounds in one day or 5 pounds in one  week track weight in diary watch for swelling in feet, ankles and legs every day weigh myself daily begin a heart failure diary bring diary to all appointments develop a rescue plan identify and avoid work-related triggers begin a symptom diary follow rescue plan if symptoms flare-up keep follow-up appointments:   check blood pressure 3 times per week choose a place to take my blood pressure (home, clinic or office, retail store) write blood pressure results in a log or diary keep a blood pressure log call doctor for signs and symptoms of high blood pressure keep all doctor appointments take medications for blood pressure exactly as prescribed Follow Up Plan:  The patient has been provided with contact information for the care management team and has been advised to call with any health related questions or concerns.   Current Barriers:  Chronic Disease Management support, education, and care coordination needs related to  Clinical Goal(s) related to Atrial Fibrillation, CHF, HTN, and COPD:  Over the next 30 days, patient will:  Work with the care management team to address educational, disease management, and care coordination needs  Begin or continue self health monitoring activities as directed today Measure and record blood pressure 5-7 times per week and assess swelling in lower extremities daily, check O2 sats when short of breath  Call provider office for new or worsened signs and symptoms Blood pressure findings outside established parameters, breathing outside of baseline,  Shortness of breath, and New or worsened symptom related to CHF and COPD Call care management team with questions or concerns Verbalize basic understanding of patient centered plan of care established today  Interventions related to Atrial Fibrillation, CHF, HTN, and COPD:  Appropriate assessments completed Reviewed medications with patient and assessed medication taking behavior;  if indicated, discussed  importance of medication adherence  Discussed plans with patient for ongoing care management follow up and ensured patient has contact number for CCM team and clinic  Patient Self Care Activities related to Atrial Fibrillation, CHF, HTN, and COPD:  Patient is unable to independently self-manage chronic health conditions       Plan: The patient has been provided with contact information for the care management team and has been advised to call with any health related questions or concerns.  Johnney Killian, RN, BSN, CCM Care Management Coordinator [facility] Internal Medicine Phone: [phone]: [phone]

## 2021-03-22 NOTE — Progress Notes (Signed)
[facility] NOTE  Patient Care Team: [name], DO as PCP - General Gwenlyn Found Pearletha Forge, MD as PCP - Cardiology (Cardiology) Jesus Genera, RN as Registered Nurse Drema Pry as Social Worker Jovita Kussmaul, MD as Consulting Physician (General Surgery) Nicholas Lose, MD as Consulting Physician (Hematology and Oncology) Kyung Rudd, MD as Consulting Physician (Radiation Oncology) Rockwell Germany, RN as Oncology Nurse Navigator Mauro Kaufmann, RN as Oncology Nurse Navigator  CHIEF COMPLAINTS/PURPOSE OF CONSULTATION:  Newly diagnosed right breast cancer  HISTORY OF PRESENTING ILLNESS:  Sharon Vasquez 70 y.o. female is here because of recent diagnosis of cancer of the right breast. She presented with non focal pain in the medial aspects of both breast for the past 3-4 months. Diagnostic mammogram and [place] on 03/02/2021 showed irregular mass in the anterior aspect of the right breast laterally. Biopsy on 03/11/2021 showed invasive ductal carcinoma and DCIS, ER/PR+(100%)/Her2-. She presents to the clinic today for initial evaluation and discussion of treatment options.   I reviewed her records extensively and collaborated the history with the patient.  SUMMARY OF ONCOLOGIC HISTORY: Oncology History  Malignant neoplasm of upper-outer quadrant of right breast in female, estrogen receptor positive ([place])  03/11/2021 Initial Diagnosis   Bilateral breast pain for 3-4 months. Diagnostic mammogram: irregular mass in the anterior aspect of the right breast laterally at 10:00 1.7 cm.  Ultrasound: 2 masses 1.7 cm and 0.9 cm biopsy: Grade 2 invasive ductal carcinoma and DCIS, ER/PR+(100%)/Her2-.  Ki-67 5%   03/23/2021 Cancer Staging   Staging form: Breast, AJCC 8th Edition - Clinical stage from 03/23/2021: Stage IA (cT1c, cN0, cM0, G2, ER+, PR+, HER2-) - Signed by Nicholas Lose, MD on 03/23/2021 Stage prefix: Initial diagnosis Histologic grading system: 3 grade  system      MEDICAL HISTORY:  Past Medical History:  Diagnosis Date   A-fib [facility])    Anxiety    Arthritis    "qwhre; joints, back" (04/17/2017)   Benign breast cyst in female, left 01/08/2017   Found by Screening mammogram, evaluated by U/S on 01/08/17 and determined to be a benign simple breast cyst.   Breast cancer ([place])    Cellulitis of left lower leg 05/30/2017   CHF (congestive heart failure) (HCC)    Chronic low back pain 08/21/2016   Chronic lower back pain    Chronic venous insufficiency    /notes 05/30/2017   COPD (chronic obstructive pulmonary disease) (HCC)    Depression    DVT (deep venous thrombosis) ([place]) 11/16/2016   GERD (gastroesophageal reflux disease)    Headache    "weekly for the last 3 months" (04/17/2017)   Hyperlipidemia    Hypertension    Morbid obesity ([place])    PE (pulmonary embolism)    Pulmonary embolism ([place]) 09/21/2014   Sleep apnea     SURGICAL HISTORY: Past Surgical History:  Procedure Laterality Date   ABDOMINAL HYSTERECTOMY     APPENDECTOMY     BALLOON DILATION N/A 03/09/2020   Procedure: BALLOON DILATION;  Surgeon: Otis Brace, MD;  Location: [facility];  Service: Gastroenterology;  Laterality: N/A;   BIOPSY  03/09/2020   Procedure: BIOPSY;  Surgeon: Otis Brace, MD;  Location: [facility];  Service: Gastroenterology;;   BREAST BIOPSY Right 03/11/2021   US biopsy/ coil clip/ path pending   BREAST BIOPSY Right 03/11/2021   US biopsy/ ribbone clip/ path pending   BREAST CYST EXCISION Left    "six o'clock"  BREAST LUMPECTOMY Left    CHOLECYSTECTOMY     DILATION AND CURETTAGE OF UTERUS     ESOPHAGOGASTRODUODENOSCOPY (EGD) WITH PROPOFOL N/A 03/09/2020   Procedure: ESOPHAGOGASTRODUODENOSCOPY (EGD) WITH PROPOFOL;  Surgeon: Otis Brace, MD;  Location: [facility];  Service: Gastroenterology;  Laterality: N/A;   IR ABLATE LIVER CRYOABLATION  07/23/2019   IR RADIOLOGIST EVAL & MGMT  07/18/2019   TONSILLECTOMY AND  ADENOIDECTOMY     TUBAL LIGATION      SOCIAL HISTORY: Social History   Socioeconomic History   Marital status: Divorced    Spouse name: Not on file   Number of children: Not on file   Years of education: Not on file   Highest education level: Not on file  Occupational History   Not on file  Tobacco Use   Smoking status: Every Day    Packs/day: 2.00    Years: 55.00    Pack years: 110.00    Types: Cigarettes    Start date: 08/16/1961    Last attempt to quit: 05/03/2020    Years since quitting: 0.8   Smokeless tobacco: Never  Vaping Use   Vaping Use: Never used  Substance and Sexual Activity   Alcohol use: No   Drug use: No   Sexual activity: Not Currently    Partners: Male  Other Topics Concern   Not on file  Social History Narrative   Lives in [facility] in [place]. Lives alone, but is dependent in ADLs/IADLs. 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 Two daughters live nearby. Previously resided in [place].   Social Determinants of Health   Financial Resource Strain: Not on file  Food Insecurity: No Food Insecurity   Worried About Charity fundraiser in the Last Year: Never true   Ran Out of Food in the Last Year: Never true  Transportation Needs: No Transportation Needs   Lack of Transportation (Medical): No   Lack of Transportation (Non-Medical): No  Physical Activity: Not on file  Stress: Not on file  Social Connections: Not on file  Intimate Partner Violence: Not on file    FAMILY HISTORY: Family History  Problem Relation Age of Onset   Breast cancer Mother        before age 66   Hypertension Mother    Hyperlipidemia Mother    Hypertension Maternal Grandfather    Hyperlipidemia Maternal Grandfather     ALLERGIES:  has No Known Allergies.  MEDICATIONS:  Current Outpatient Medications  Medication Sig Dispense Refill   letrozole (FEMARA) 2.5 MG tablet Take 1 tablet (2.5 mg total) by mouth daily. 30  tablet 6   potassium chloride SA (KLOR-CON M) 20 MEQ tablet Take 1 tablet (20 mEq total) by mouth daily. 30 tablet 1   albuterol (VENTOLIN HFA) 108 (90 Base) MCG/ACT inhaler Inhale into the lungs.     aspirin (ASPIRIN CHILDRENS) 81 MG chewable tablet 1 tablet DAILY (route: oral)     aspirin EC 81 MG tablet Take 81 mg by mouth daily.     atorvastatin (LIPITOR) 40 MG tablet SMARTSIG:1 Tablet(s) By Mouth Every Evening     atorvastatin (LIPITOR) 80 MG tablet TAKE 1 TABLET(80 MG) BY MOUTH DAILY 90 tablet 0   diclofenac Sodium (VOLTAREN) 1 % GEL Apply 2 g topically 4 (four) times daily. 100 g 1   esomeprazole (NEXIUM) 40 MG capsule Take 1 capsule (40 mg total) by mouth daily as needed (for heartburn or  indigestion). (Patient taking differently: Take 40 mg by mouth daily.) 90 capsule 1   gabapentin (NEURONTIN) 400 MG capsule TAKE 1 CAPSULE BY MOUTH  TWICE DAILY 180 capsule 3   guaiFENesin-dextromethorphan (ROBITUSSIN DM) 100-10 MG/5ML syrup Take 5 mLs by mouth every 4 (four) hours as needed for cough. 236 mL 0   ipratropium-albuterol (DUONEB) 0.5-2.5 (3) MG/3ML SOLN Take 3 mLs by nebulization every 6 (six) hours as needed. (Patient taking differently: Take 3 mLs by nebulization every 6 (six) hours as needed (shortness of breath/wheezing).) 360 mL 3   isosorbide mononitrate (IMDUR) 30 MG 24 hr tablet TAKE 1 TABLET(30 MG) BY MOUTH DAILY 90 tablet 0   metolazone (ZAROXOLYN) 2.5 MG tablet Take 1 tablet (2.5 mg total) by mouth every other day. 30 tablet 1   metoprolol succinate (TOPROL XL) 25 MG 24 hr tablet Take 1 tablet (25 mg total) by mouth daily. 90 tablet 0   mirabegron ER (MYRBETRIQ) 50 MG TB24 tablet Take 1 tablet (50 mg total) by mouth daily. 90 tablet 1   oxyCODONE (ROXICODONE) 15 MG immediate release tablet Take 15 mg by mouth 4 (four) times daily as needed for pain.     OXYGEN Inhale 2 L into the lungs as needed (shortness of breath).     potassium chloride SA (KLOR-CON M) 20 MEQ tablet Take 2  tablets (40 mEq total) by mouth 2 (two) times daily for 6 days. 24 tablet 0   predniSONE (DELTASONE) 10 MG tablet 4 tabs for 2 days, then 3 tabs for 2 days, 2 tabs for 2 days, then 1 tab for 2 days, then stop 20 tablet 0   PRESCRIPTION MEDICATION Inhale into the lungs at bedtime. cpap     torsemide (DEMADEX) 20 MG tablet Take 2 tablets by mouth daily, you may take an extra 1/2 tablet only as needed for weight gain of 2 lbs overnight or 5 lbs in a week 225 tablet 1   XARELTO 20 MG TABS tablet TAKE 1 TABLET(20 MG) BY MOUTH EVERY MORNING 90 tablet 3   No current facility-administered medications for this visit.    REVIEW OF SYSTEMS:   Constitutional: Denies fevers, chills or abnormal night sweats Eyes: Denies blurriness of vision, double vision or watery eyes Ears, nose, mouth, throat, and face: Denies mucositis or sore throat Respiratory: Significant shortness of breath, obstructive sleep apnea Cardiovascular: Bilateral leg swelling Gastrointestinal:  Denies nausea, heartburn or change in bowel habits Skin: Denies abnormal skin rashes Lymphatics: Denies new lymphadenopathy or easy bruising Neurological: Peripheral neuropathy   All other systems were reviewed with the patient and are negative.  PHYSICAL EXAMINATION: ECOG PERFORMANCE STATUS: 2 - Symptomatic, <50% confined to bed  Vitals:   03/23/21 0841  BP: (!) 181/107  Pulse: 89  Resp: 18  Temp: (!) 97.5 F (36.4 C)  SpO2: 94%   Filed Weights   03/23/21 0841  Weight: (!) 373 lb (169.2 kg)       LABORATORY DATA:  I have reviewed the data as listed Lab Results  Component Value Date   WBC 6.3 03/23/2021   HGB 11.3 (L) 03/23/2021   HCT 32.7 (L) 03/23/2021   MCV 83.8 03/23/2021   PLT 341 03/23/2021   Lab Results  Component Value Date   NA 142 03/23/2021   K 2.9 (L) 03/23/2021   CL 104 03/23/2021   CO2 29 03/23/2021    RADIOGRAPHIC STUDIES: I have personally reviewed the radiological reports and agreed with the  findings in the  report.  ASSESSMENT AND PLAN:  Malignant neoplasm of upper-outer quadrant of right breast in female, estrogen receptor positive ([place]) 03/11/2021:Bilateral breast pain for 3-4 months. Diagnostic mammogram: irregular mass in the anterior aspect of the right breast laterally at 10:00 1.7 cm.  Ultrasound: 2 masses 1.7 cm and 0.9 cm biopsy: Grade 2 invasive ductal carcinoma and DCIS, ER/PR+(100%)/Her2-.  Ki-67 5%  Pathology and radiology counseling:Discussed with the patient, the details of pathology including the type of breast cancer,the clinical staging, the significance of ER, PR and HER-2/neu receptors and the implications for treatment. After reviewing the pathology in detail, we proceeded to discuss the different treatment options between surgery, radiation, chemotherapy, antiestrogen therapies.  Recommendations: 1. Breast conserving surgery followed by 2. patient is not a candidate for chemotherapy given her multiple health issues and performance status. 3. Adjuvant radiation therapy followed by 4. Adjuvant antiestrogen therapy  Return to clinic after surgery to discuss final pathology report and finalize the adjuvant treatment plan   All questions were answered. The patient knows to call the clinic with any problems, questions or concerns.   Rulon Eisenmenger, MD, MPH 03/23/2021    I, Thana Ates, am acting as scribe for Nicholas Lose, MD.  I have reviewed the above documentation for accuracy and completeness, and I agree with the above.

## 2021-03-23 ENCOUNTER — Encounter: Payer: Self-pay | Admitting: Hematology and Oncology

## 2021-03-23 ENCOUNTER — Ambulatory Visit
Admission: RE | Admit: 2021-03-23 | Discharge: 2021-03-23 | Disposition: A | Discharge status: Home / Self Care | Payer: Medicare Other | Source: Ambulatory Visit | Attending: Radiation Oncology | Admitting: Radiation Oncology

## 2021-03-23 ENCOUNTER — Inpatient Hospital Stay (HOSPITAL_BASED_OUTPATIENT_CLINIC_OR_DEPARTMENT_OTHER): Payer: Medicare Other | Admitting: Hematology and Oncology

## 2021-03-23 ENCOUNTER — Encounter: Payer: Self-pay | Admitting: *Deleted

## 2021-03-23 ENCOUNTER — Other Ambulatory Visit: Payer: Self-pay

## 2021-03-23 ENCOUNTER — Ambulatory Visit: Payer: Medicare Other | Admitting: Physical Therapy

## 2021-03-23 ENCOUNTER — Inpatient Hospital Stay: Payer: Medicare Other | Admitting: Licensed Clinical Social Worker

## 2021-03-23 ENCOUNTER — Inpatient Hospital Stay: Payer: Medicare Other | Attending: Hematology and Oncology

## 2021-03-23 ENCOUNTER — Encounter: Payer: Self-pay | Admitting: Genetic Counselor

## 2021-03-23 ENCOUNTER — Ambulatory Visit: Payer: Self-pay | Admitting: General Surgery

## 2021-03-23 DIAGNOSIS — Z170 Estrogen receptor positive status [ER+]: Secondary | ICD-10-CM

## 2021-03-23 DIAGNOSIS — Z1379 Encounter for other screening for genetic and chromosomal anomalies: Secondary | ICD-10-CM | POA: Insufficient documentation

## 2021-03-23 DIAGNOSIS — Z7189 Other specified counseling: Secondary | ICD-10-CM | POA: Insufficient documentation

## 2021-03-23 DIAGNOSIS — C50411 Malignant neoplasm of upper-outer quadrant of right female breast: Secondary | ICD-10-CM

## 2021-03-23 LAB — CBC WITH DIFFERENTIAL (CANCER CENTER ONLY)
Abs Immature Granulocytes: 0.01 10*3/uL (ref 0.00–0.07)
Basophils Absolute: 0 10*3/uL (ref 0.0–0.1)
Basophils Relative: 0 %
Eosinophils Absolute: 0 10*3/uL (ref 0.0–0.5)
Eosinophils Relative: 1 %
HCT: 32.7 % — ABNORMAL LOW (ref 36.0–46.0)
Hemoglobin: 11.3 g/dL — ABNORMAL LOW (ref 12.0–15.0)
Immature Granulocytes: 0 %
Lymphocytes Relative: 26 %
Lymphs Abs: 1.7 10*3/uL (ref 0.7–4.0)
MCH: 29 pg (ref 26.0–34.0)
MCHC: 34.6 g/dL (ref 30.0–36.0)
MCV: 83.8 fL (ref 80.0–100.0)
Monocytes Absolute: 0.4 10*3/uL (ref 0.1–1.0)
Monocytes Relative: 6 %
Neutro Abs: 4.3 10*3/uL (ref 1.7–7.7)
Neutrophils Relative %: 67 %
Platelet Count: 341 10*3/uL (ref 150–400)
RBC: 3.9 MIL/uL (ref 3.87–5.11)
RDW: 13.1 % (ref 11.5–15.5)
WBC Count: 6.3 10*3/uL (ref 4.0–10.5)
nRBC: 0 % (ref 0.0–0.2)

## 2021-03-23 LAB — CMP (CANCER CENTER ONLY)
ALT: 15 U/L (ref 0–44)
AST: 17 U/L (ref 15–41)
Albumin: 3.7 g/dL (ref 3.5–5.0)
Alkaline Phosphatase: 75 U/L (ref 38–126)
Anion gap: 9 (ref 5–15)
BUN: 13 mg/dL (ref 8–23)
CO2: 29 mmol/L (ref 22–32)
Calcium: 8.8 mg/dL — ABNORMAL LOW (ref 8.9–10.3)
Chloride: 104 mmol/L (ref 98–111)
Creatinine: 0.92 mg/dL (ref 0.44–1.00)
GFR, Estimated: >60 mL/min (ref >60)
Glucose, Bld: 142 mg/dL — ABNORMAL HIGH (ref 70–99)
Potassium: 2.9 mmol/L — ABNORMAL LOW (ref 3.5–5.1)
Sodium: 142 mmol/L (ref 135–145)
Total Bilirubin: 0.6 mg/dL (ref 0.3–1.2)
Total Protein: 7 g/dL (ref 6.5–8.1)

## 2021-03-23 LAB — GENETIC SCREENING ORDER

## 2021-03-23 MED ORDER — LETROZOLE 2.5 MG PO TABS: 2.5000 mg | ORAL_TABLET | Freq: Every day | ORAL | 6 refills | Status: DC

## 2021-03-23 NOTE — Addendum Note (Signed)
Encounter addended by: Hayden Pedro, PA-C on: 03/23/2021 10:45 AM  Actions taken: Clinical Note Signed

## 2021-03-23 NOTE — Assessment & Plan Note (Signed)
03/11/2021:Bilateral breast pain for 3-4 months. Diagnostic mammogram: irregular mass in the anterior aspect of the right breast laterally at 10:00 1.7 cm.  Ultrasound: 2 masses 1.7 cm and 0.9 cm biopsy: Grade 2 invasive ductal carcinoma and DCIS, ER/PR+(100%)/Her2-.  Ki-67 5%  Pathology and radiology counseling:Discussed with the patient, the details of pathology including the type of breast cancer,the clinical staging, the significance of ER, PR and HER-2/neu receptors and the implications for treatment. After reviewing the pathology in detail, we proceeded to discuss the different treatment options between surgery, radiation, chemotherapy, antiestrogen therapies.  Recommendations: 1. Breast conserving surgery followed by 2. Oncotype DX testing to determine if chemotherapy would be of any benefit followed by 3. Adjuvant radiation therapy followed by 4. Adjuvant antiestrogen therapy  Oncotype counseling: I discussed Oncotype DX test. I explained to the patient that this is a 21 gene panel to evaluate patient tumors DNA to calculate recurrence score. This would help determine whether patient has high risk or low risk breast cancer. She understands that if her tumor was found to be high risk, she would benefit from systemic chemotherapy. If low risk, no need of chemotherapy.  Return to clinic after surgery to discuss final pathology report and then determine if Oncotype DX testing will need to be sent.

## 2021-03-23 NOTE — Progress Notes (Addendum)
Radiation Oncology         [phone] ________________________________  Name: Sharon Vasquez        [name]: [id number]  Date of Service: 03/23/2021 DOB: Mar 08, 1951  CC:[name], DO  Jovita Kussmaul, MD     REFERRING PHYSICIAN: Autumn Messing III, MD   DIAGNOSIS: The encounter diagnosis was Malignant neoplasm of upper-outer quadrant of right breast in female, estrogen receptor positive (Newborn).   HISTORY OF PRESENT ILLNESS: Sharon Vasquez is a 70 y.o. female seen in the multidisciplinary breast clinic for a new diagnosis of right  breast cancer. The patient was noted to have bilateral breast pain for several months. Diagnostic imaging did not show any concerns for left sided abnormalities, but her right breast did show a mass and calcifications. Further work up showed a mass at 10:00 measuring 1.7 cm, and a second lesion posteriorly in the 10:00 position also that measured 9 mm. No right axillary adenopathy was noted. She underwent a biopsy of both lesion in the right breast on 03/11/21 that showed a grade 2 invasive ductal carcinoma in the larger lesion that was ER/PR positive, HER2 negative with a Ki 67 of 5%. Her second lesion more posteriorly had similar invasive disease but also associated with DCIS. She's seen today to discuss treatment recommendations for her cancer.    PREVIOUS RADIATION THERAPY: No   PAST MEDICAL HISTORY:  Past Medical History:  Diagnosis Date   A-fib [facility])    Anxiety    Arthritis    "qwhre; joints, back" (04/17/2017)   Benign breast cyst in female, left 01/08/2017   Found by Screening mammogram, evaluated by U/S on 01/08/17 and determined to be a benign simple breast cyst.   Cellulitis of left lower leg 05/30/2017   CHF (congestive heart failure) (HCC)    Chronic low back pain 08/21/2016   Chronic lower back pain    Chronic venous insufficiency    /notes 05/30/2017   COPD (chronic obstructive pulmonary disease) (HCC)    Depression    DVT (deep venous  thrombosis) ([place]) 11/16/2016   GERD (gastroesophageal reflux disease)    Headache    "weekly for the last 3 months" (04/17/2017)   Hyperlipidemia    Hypertension    Morbid obesity ([place])    PE (pulmonary embolism)    Pulmonary embolism ([place]) 09/21/2014   Sleep apnea        PAST SURGICAL HISTORY: Past Surgical History:  Procedure Laterality Date   ABDOMINAL HYSTERECTOMY     APPENDECTOMY     BALLOON DILATION N/A 03/09/2020   Procedure: BALLOON DILATION;  Surgeon: Otis Brace, MD;  Location: [facility];  Service: Gastroenterology;  Laterality: N/A;   BIOPSY  03/09/2020   Procedure: BIOPSY;  Surgeon: Otis Brace, MD;  Location: [facility];  Service: Gastroenterology;;   BREAST BIOPSY Right 03/11/2021   US biopsy/ coil clip/ path pending   BREAST BIOPSY Right 03/11/2021   US biopsy/ ribbone clip/ path pending   BREAST CYST EXCISION Left    "six o'clock"   BREAST LUMPECTOMY Left    CHOLECYSTECTOMY     DILATION AND CURETTAGE OF UTERUS     ESOPHAGOGASTRODUODENOSCOPY (EGD) WITH PROPOFOL N/A 03/09/2020   Procedure: ESOPHAGOGASTRODUODENOSCOPY (EGD) WITH PROPOFOL;  Surgeon: Otis Brace, MD;  Location: [facility];  Service: Gastroenterology;  Laterality: N/A;   IR ABLATE LIVER CRYOABLATION  07/23/2019   IR RADIOLOGIST EVAL & MGMT  07/18/2019   TONSILLECTOMY AND ADENOIDECTOMY     TUBAL LIGATION  FAMILY HISTORY:  Family History  Problem Relation Age of Onset   Breast cancer Mother    Hypertension Mother    Hyperlipidemia Mother    Hypertension Maternal Grandfather    Hyperlipidemia Maternal Grandfather      SOCIAL HISTORY:  reports that she has been smoking cigarettes. She started smoking about 59 years ago. She has a 110.00 pack-year smoking history. She has never used smokeless tobacco. She reports that she does not drink alcohol and does not use drugs. The patient is divorced and lives in [place].   ALLERGIES: Patient has no known  allergies.   MEDICATIONS:  Current Outpatient Medications  Medication Sig Dispense Refill   potassium chloride SA (KLOR-CON M) 20 MEQ tablet Take 1 tablet (20 mEq total) by mouth daily. 30 tablet 1   albuterol (VENTOLIN HFA) 108 (90 Base) MCG/ACT inhaler Inhale into the lungs.     aspirin (ASPIRIN CHILDRENS) 81 MG chewable tablet 1 tablet DAILY (route: oral)     aspirin EC 81 MG tablet Take 81 mg by mouth daily.     atorvastatin (LIPITOR) 40 MG tablet SMARTSIG:1 Tablet(s) By Mouth Every Evening     atorvastatin (LIPITOR) 80 MG tablet TAKE 1 TABLET(80 MG) BY MOUTH DAILY 90 tablet 0   diclofenac Sodium (VOLTAREN) 1 % GEL Apply 2 g topically 4 (four) times daily. 100 g 1   esomeprazole (NEXIUM) 40 MG capsule Take 1 capsule (40 mg total) by mouth daily as needed (for heartburn or indigestion). (Patient taking differently: Take 40 mg by mouth daily.) 90 capsule 1   gabapentin (NEURONTIN) 400 MG capsule TAKE 1 CAPSULE BY MOUTH  TWICE DAILY 180 capsule 3   guaiFENesin-dextromethorphan (ROBITUSSIN DM) 100-10 MG/5ML syrup Take 5 mLs by mouth every 4 (four) hours as needed for cough. 236 mL 0   ipratropium-albuterol (DUONEB) 0.5-2.5 (3) MG/3ML SOLN Take 3 mLs by nebulization every 6 (six) hours as needed. (Patient taking differently: Take 3 mLs by nebulization every 6 (six) hours as needed (shortness of breath/wheezing).) 360 mL 3   isosorbide mononitrate (IMDUR) 30 MG 24 hr tablet TAKE 1 TABLET(30 MG) BY MOUTH DAILY 90 tablet 0   metolazone (ZAROXOLYN) 2.5 MG tablet Take 1 tablet (2.5 mg total) by mouth every other day. 30 tablet 1   metoprolol succinate (TOPROL XL) 25 MG 24 hr tablet Take 1 tablet (25 mg total) by mouth daily. 90 tablet 0   mirabegron ER (MYRBETRIQ) 50 MG TB24 tablet Take 1 tablet (50 mg total) by mouth daily. 90 tablet 1   oxyCODONE (ROXICODONE) 15 MG immediate release tablet Take 15 mg by mouth 4 (four) times daily as needed for pain.     OXYGEN Inhale 2 L into the lungs as needed  (shortness of breath).     potassium chloride SA (KLOR-CON M) 20 MEQ tablet Take 2 tablets (40 mEq total) by mouth 2 (two) times daily for 6 days. 24 tablet 0   predniSONE (DELTASONE) 10 MG tablet 4 tabs for 2 days, then 3 tabs for 2 days, 2 tabs for 2 days, then 1 tab for 2 days, then stop 20 tablet 0   PRESCRIPTION MEDICATION Inhale into the lungs at bedtime. cpap     torsemide (DEMADEX) 20 MG tablet Take 2 tablets by mouth daily, you may take an extra 1/2 tablet only as needed for weight gain of 2 lbs overnight or 5 lbs in a week 225 tablet 1   XARELTO 20 MG TABS tablet TAKE 1 TABLET(20  MG) BY MOUTH EVERY MORNING 90 tablet 3   No current facility-administered medications for this encounter.     REVIEW OF SYSTEMS: On review of systems, the patient reports that she is doing okay. She does not complain of any breast specific concerns. She is short of breath at rest, and reports she has nerve injury and pain and takes pain medication for her legs as well as Neurontin for her nerve difficulties.      PHYSICAL EXAM:  Wt Readings from Last 3 Encounters:  03/16/21 (!) 369 lb (167.4 kg)  03/03/21 (!) 369 lb 6.4 oz (167.6 kg)  02/02/21 (!) 381 lb 14.4 oz (173.2 kg)   Temp Readings from Last 3 Encounters:  02/02/21 98.4 F (36.9 C) (Oral)  01/21/21 97.7 F (36.5 C) (Oral)  01/17/21 98 F (36.7 C) (Oral)   BP Readings from Last 3 Encounters:  03/16/21 130/88  03/03/21 135/85  02/02/21 124/90   Pulse Readings from Last 3 Encounters:  03/16/21 98  03/03/21 95  02/02/21 83    In general this is a chronically ill, obese, short of breath appearing African American female in no acute distress. She's alert and oriented x4 and appropriate throughout the examination. Cardiopulmonary assessment is negative for acute distress and but she appears to have increased pulmonary effort. Bilateral breast exam is deferred, but her breasts fully dressed are large and pendulous.    ECOG = 2  0 -  Asymptomatic (Fully active, able to carry on all predisease activities without restriction)  1 - Symptomatic but completely ambulatory (Restricted in physically strenuous activity but ambulatory and able to carry out work of a light or sedentary nature. For example, light housework, office work)  2 - Symptomatic, <50% in bed during the day (Ambulatory and capable of all self care but unable to carry out any work activities. Up and about more than 50% of waking hours)  3 - Symptomatic, >50% in bed, but not bedbound (Capable of only limited self-care, confined to bed or chair 50% or more of waking hours)  4 - Bedbound (Completely disabled. Cannot carry on any self-care. Totally confined to bed or chair)  5 - Death   Eustace Pen MM, Creech RH, Tormey DC, et al. [phone]). "Toxicity and response criteria of the [facility]". [place] Oncol. 5 (6): 649-55    LABORATORY DATA:  Lab Results  Component Value Date   WBC 6.0 10/07/2020   HGB 13.0 10/07/2020   HCT 39.6 10/07/2020   MCV 86.7 10/07/2020   PLT 364 10/07/2020   Lab Results  Component Value Date   NA 142 03/09/2021   K 3.5 03/09/2021   CL 100 03/09/2021   CO2 27 03/09/2021   Lab Results  Component Value Date   ALT 17 10/07/2020   AST 20 10/07/2020   ALKPHOS 72 10/07/2020   BILITOT 0.9 10/07/2020      RADIOGRAPHY: DG Chest 2 View  Result Date: 03/17/2021 CLINICAL DATA:  COPD.  Pre-op clearance exam EXAM: CHEST - 2 VIEW COMPARISON:  10/07/2020 FINDINGS: Stable moderate cardiomegaly. Diffuse coarsening of interstitial lung markings is unchanged, consistent with chronic interstitial lung disease. No evidence of pulmonary consolidation or pleural effusion. IMPRESSION: Stable cardiomegaly and chronic interstitial lung disease. No acute findings. Electronically Signed   By: Marlaine Hind M.D.   On: 03/17/2021 08:16   US BREAST LTD UNI RIGHT INC AXILLA  Result Date: 03/02/2021 CLINICAL DATA:  Nonfocal pain in  the medial aspects of  both breasts for the past 3 months. This was constant for the 1st 2 weeks and has been intermittent since. She is wheelchair-bound and did not bring her oxygen for today's evaluation. EXAM: DIGITAL DIAGNOSTIC BILATERAL MAMMOGRAM WITH TOMOSYNTHESIS AND CAD; ULTRASOUND RIGHT BREAST LIMITED TECHNIQUE: Bilateral digital diagnostic mammography and breast tomosynthesis was performed. The images were evaluated with computer-aided detection.; Targeted ultrasound examination of the right breast was performed COMPARISON:  Previous exam(s). ACR Breast Density Category b: There are scattered areas of fibroglandular density. FINDINGS: There is a suggestion of and irregular mass in the anterior aspect of the right breast laterally. There are also calcifications more posteriorly in the mid anterior right breast laterally in an area of a possible 2nd mass suspected on the initial mammogram images. No mass was confirmed at that location with spot compression or true lateral images. An 8 mm rounded, circumscribed mass is demonstrated in thea mid medial right breast in approximately the 2 o'clock position. No findings suspicious for malignancy in the left breast. On physical exam, the patient has an approximately 1.5 cm palpable mass in the 10 o'clock position of the right breast, 3 cm from the nipple. No mass is palpable more posteriorly and laterally in that region of the breast. There are no palpable right axillary lymph nodes. Targeted ultrasound is performed, showing a 1.7 x 1.1 x 0.9 cm irregular, hypoechoic mass in the 10 o'clock position of the right breast, 3 cm from the nipple. This corresponds to the more anterior mammographic mass. A 0.9 x 0.8 x 0.7 cm similar-appearing mass is demonstrated in the 10 o'clock position of the right breast, 6 cm from the nipple, corresponding to the 2nd mass seen mammographically. An 8 x 5 x 4 mm cyst with mildly thickened internal septations without nodularity is  demonstrated in the 2 o'clock position of the right breast, 10 cm from the nipple. No internal blood flow was seen with power Doppler. This corresponds to the medial mammographic mass. Ultrasound of the right axilla was somewhat difficult due to the large size of the patient with a few normal appearing right axillary lymph nodes visualized. IMPRESSION: 1. 1.7 cm mass with imaging features highly suspicious for malignancy in the 10 o'clock position of the right breast, 3 cm from the nipple. 2. 0.9 cm similar appearing mass with imaging features highly suspicious for malignancy in the 10 o'clock position of the right breast, 6 cm from the nipple. 3. No evidence of malignancy elsewhere in either breast and no right axillary adenopathy visualized. RECOMMENDATION: Ultrasound-guided core needle biopsies of the 1.7 cm and 0.9 cm mass in the 10 o'clock position of the right breast. This has been discussed the patient and scheduled at 2:45 p.m. on 03/11/2021. I have discussed the findings and recommendations with the patient. If applicable, a reminder letter will be sent to the patient regarding the next appointment. BI-RADS CATEGORY  5: Highly suggestive of malignancy. Electronically Signed   By: Claudie Revering M.D.   On: 03/02/2021 17:20  MM DIAG BREAST TOMO BILATERAL  Result Date: 03/02/2021 CLINICAL DATA:  Nonfocal pain in the medial aspects of both breasts for the past 3 months. This was constant for the 1st 2 weeks and has been intermittent since. She is wheelchair-bound and did not bring her oxygen for today's evaluation. EXAM: DIGITAL DIAGNOSTIC BILATERAL MAMMOGRAM WITH TOMOSYNTHESIS AND CAD; ULTRASOUND RIGHT BREAST LIMITED TECHNIQUE: Bilateral digital diagnostic mammography and breast tomosynthesis was performed. The images were evaluated with computer-aided detection.; Targeted ultrasound  examination of the right breast was performed COMPARISON:  Previous exam(s). ACR Breast Density Category b: There are  scattered areas of fibroglandular density. FINDINGS: There is a suggestion of and irregular mass in the anterior aspect of the right breast laterally. There are also calcifications more posteriorly in the mid anterior right breast laterally in an area of a possible 2nd mass suspected on the initial mammogram images. No mass was confirmed at that location with spot compression or true lateral images. An 8 mm rounded, circumscribed mass is demonstrated in thea mid medial right breast in approximately the 2 o'clock position. No findings suspicious for malignancy in the left breast. On physical exam, the patient has an approximately 1.5 cm palpable mass in the 10 o'clock position of the right breast, 3 cm from the nipple. No mass is palpable more posteriorly and laterally in that region of the breast. There are no palpable right axillary lymph nodes. Targeted ultrasound is performed, showing a 1.7 x 1.1 x 0.9 cm irregular, hypoechoic mass in the 10 o'clock position of the right breast, 3 cm from the nipple. This corresponds to the more anterior mammographic mass. A 0.9 x 0.8 x 0.7 cm similar-appearing mass is demonstrated in the 10 o'clock position of the right breast, 6 cm from the nipple, corresponding to the 2nd mass seen mammographically. An 8 x 5 x 4 mm cyst with mildly thickened internal septations without nodularity is demonstrated in the 2 o'clock position of the right breast, 10 cm from the nipple. No internal blood flow was seen with power Doppler. This corresponds to the medial mammographic mass. Ultrasound of the right axilla was somewhat difficult due to the large size of the patient with a few normal appearing right axillary lymph nodes visualized. IMPRESSION: 1. 1.7 cm mass with imaging features highly suspicious for malignancy in the 10 o'clock position of the right breast, 3 cm from the nipple. 2. 0.9 cm similar appearing mass with imaging features highly suspicious for malignancy in the 10 o'clock  position of the right breast, 6 cm from the nipple. 3. No evidence of malignancy elsewhere in either breast and no right axillary adenopathy visualized. RECOMMENDATION: Ultrasound-guided core needle biopsies of the 1.7 cm and 0.9 cm mass in the 10 o'clock position of the right breast. This has been discussed the patient and scheduled at 2:45 p.m. on 03/11/2021. I have discussed the findings and recommendations with the patient. If applicable, a reminder letter will be sent to the patient regarding the next appointment. BI-RADS CATEGORY  5: Highly suggestive of malignancy. Electronically Signed   By: Claudie Revering M.D.   On: 03/02/2021 17:20  MM CLIP PLACEMENT RIGHT  Result Date: 03/11/2021 CLINICAL DATA:  Status post 2 ultrasound-guided biopsies (2 RIGHT breast masses) today. EXAM: 3D DIAGNOSTIC RIGHT MAMMOGRAM POST ULTRASOUND BIOPSY x2 COMPARISON:  Previous exam(s). FINDINGS: 3D Mammographic images were obtained following ultrasound guided biopsy of the RIGHT breast mass at the 10 o'clock axis, 3 cm from the nipple and ultrasound-guided biopsy of the RIGHT breast mass at the 10 o'clock axis, 6 cm from nipple. The biopsy marking clips are in expected position at the sites of biopsy. IMPRESSION: 1. Appropriate positioning of the ribbon shaped biopsy marking clip at the site of biopsy in the upper-outer quadrant of the RIGHT breast corresponding to the targeted mass at the 10 o'clock axis, 3 cm from the nipple. 2. Appropriate positioning of the coil shaped biopsy marking clip at the site of biopsy in the upper-outer  quadrant of the RIGHT breast corresponding to the targeted mass at the 10 o'clock axis, 6 cm from the nipple. 3. Postprocedure hematoma is noted. Quick clot and compression bandages applied. Hemostasis obtained prior to discharge. Final Assessment: Post Procedure Mammograms for Marker Placement Electronically Signed   By: Franki Cabot M.D.   On: 03/11/2021 16:23  [place] RT BREAST BX W LOC DEV 1ST LESION  IMG BX SPEC US GUIDE  Addendum Date: 03/15/2021   ADDENDUM REPORT: 03/15/2021 14:48 ADDENDUM: Pathology revealed GRADE II INVASIVE DUCTAL CARCINOMA of the RIGHT breast, 10:00 o'clock 3cm from nipple, (ribbon clip). This was found to be concordant by Dr. Franki Cabot. Pathology revealed GRADE II INVASIVE DUCTAL CARCINOMA, DUCTAL CARCINOMA IN SITU of the RIGHT breast, 10:00 o'clock 6cm from nipple, (coil clip). This was found to be concordant by Dr. Franki Cabot. Pathology results were discussed with the patient by telephone. The patient reported doing well after the biopsies with tenderness at the sites. Post biopsy instructions and care were reviewed and questions were answered. The patient was encouraged to call [facility] for any additional concerns. My direct phone number was provided. The patient was referred to [facility] on March 23, 2021. Pathology results reported by Terie Purser, RN on 03/15/2021. Electronically Signed   By: Franki Cabot M.D.   On: 03/15/2021 14:48   Result Date: 03/15/2021 CLINICAL DATA:  Patient with 2 masses in the RIGHT breast at the 10 o'clock axis, 3 cm from the nipple and 6 cm from the nipple respectively, measuring 1.7 cm and 0.9 cm. Both masses are scheduled for ultrasound-guided biopsy today. EXAM: ULTRASOUND GUIDED RIGHT BREAST CORE NEEDLE BIOPSY x2 COMPARISON:  Previous exam(s). PROCEDURE: I met with the patient and we discussed the procedure of ultrasound-guided biopsy, including benefits and alternatives. We discussed the high likelihood of a successful procedure. We discussed the risks of the procedure, including infection, bleeding, tissue injury, clip migration, and inadequate sampling. Informed written consent was given. The usual time-out protocol was performed immediately prior to the procedure. Site 1: Lesion quadrant: Upper outer quadrant Using sterile  technique and 1% Lidocaine as local anesthetic, under direct ultrasound visualization, a 12 gauge spring-loaded device was used to perform biopsy of the RIGHT breast mass at the 10 o'clock axis, 3 cm from the nipple, using a lateral approach. At the conclusion of the procedure ribbon shaped tissue marker clip was deployed into the biopsy cavity. Site 2: Lesion quadrant: Upper outer quadrant Using sterile technique and 1% Lidocaine as local anesthetic, under direct ultrasound visualization, a 12 gauge spring-loaded device was used to perform biopsy of the RIGHT breast mass at the 10 o'clock axis, 6 cm from nipple using a lateral approach. At the conclusion of the procedure coil shaped tissue marker clip was deployed into the biopsy cavity. Follow up 2 view mammogram was performed and dictated separately. Follow up 2 view mammogram was performed and dictated separately. IMPRESSION: 1. Ultrasound guided biopsy of the RIGHT breast mass at the 10 o'clock axis, 3 cm from the nipple. Ribbon shaped clip placed at the biopsy site. 2. Ultrasound guided biopsy of the RIGHT breast mass at the 10 o'clock axis, 6 cm from the nipple. Coil shaped clip placed at the biopsy site. 3. Significant postprocedural bleeding. Hemostasis was obtained after compression and Quick clot application. Compression bandages were applied at discharge. Electronically Signed: By: Franki Cabot M.D. On: 03/11/2021 16:01  [place] RT BREAST BX W LOC DEV EA ADD LESION IMG BX SPEC US GUIDE  Addendum Date: 03/15/2021   ADDENDUM REPORT: 03/15/2021 14:48 ADDENDUM: Pathology revealed GRADE II INVASIVE DUCTAL CARCINOMA of the RIGHT breast, 10:00 o'clock 3cm from nipple, (ribbon clip). This was found to be concordant by Dr. Franki Cabot. Pathology revealed GRADE II INVASIVE DUCTAL CARCINOMA, DUCTAL CARCINOMA IN SITU of the RIGHT breast, 10:00 o'clock 6cm from nipple, (coil clip). This was found to be concordant by Dr. Franki Cabot. Pathology results were  discussed with the patient by telephone. The patient reported doing well after the biopsies with tenderness at the sites. Post biopsy instructions and care were reviewed and questions were answered. The patient was encouraged to call [facility] for any additional concerns. My direct phone number was provided. The patient was referred to [facility] at [facility] on March 23, 2021. Pathology results reported by Terie Purser, RN on 03/15/2021. Electronically Signed   By: Franki Cabot M.D.   On: 03/15/2021 14:48   Result Date: 03/15/2021 CLINICAL DATA:  Patient with 2 masses in the RIGHT breast at the 10 o'clock axis, 3 cm from the nipple and 6 cm from the nipple respectively, measuring 1.7 cm and 0.9 cm. Both masses are scheduled for ultrasound-guided biopsy today. EXAM: ULTRASOUND GUIDED RIGHT BREAST CORE NEEDLE BIOPSY x2 COMPARISON:  Previous exam(s). PROCEDURE: I met with the patient and we discussed the procedure of ultrasound-guided biopsy, including benefits and alternatives. We discussed the high likelihood of a successful procedure. We discussed the risks of the procedure, including infection, bleeding, tissue injury, clip migration, and inadequate sampling. Informed written consent was given. The usual time-out protocol was performed immediately prior to the procedure. Site 1: Lesion quadrant: Upper outer quadrant Using sterile technique and 1% Lidocaine as local anesthetic, under direct ultrasound visualization, a 12 gauge spring-loaded device was used to perform biopsy of the RIGHT breast mass at the 10 o'clock axis, 3 cm from the nipple, using a lateral approach. At the conclusion of the procedure ribbon shaped tissue marker clip was deployed into the biopsy cavity. Site 2: Lesion quadrant: Upper outer quadrant Using sterile technique and 1% Lidocaine as local anesthetic, under direct ultrasound visualization,  a 12 gauge spring-loaded device was used to perform biopsy of the RIGHT breast mass at the 10 o'clock axis, 6 cm from nipple using a lateral approach. At the conclusion of the procedure coil shaped tissue marker clip was deployed into the biopsy cavity. Follow up 2 view mammogram was performed and dictated separately. Follow up 2 view mammogram was performed and dictated separately. IMPRESSION: 1. Ultrasound guided biopsy of the RIGHT breast mass at the 10 o'clock axis, 3 cm from the nipple. Ribbon shaped clip placed at the biopsy site. 2. Ultrasound guided biopsy of the RIGHT breast mass at the 10 o'clock axis, 6 cm from the nipple. Coil shaped clip placed at the biopsy site. 3. Significant postprocedural bleeding. Hemostasis was obtained after compression and Quick clot application. Compression bandages were applied at discharge. Electronically Signed: By: Franki Cabot M.D. On: 03/11/2021 16:01      IMPRESSION/PLAN: 1. Stage IA, cT1cN0M0 grade 2, ER/PR positive invasive ductal carcinoma of the right breast. Dr. Lisbeth Renshaw discusses the pathology findings and reviews the nature of early stage right breast disease. The consensus from the breast conference includes breast conservation with lumpectomy. She does not appear to be a good candidate for  aggressive therapies given her comorbities. While  external radiotherapy is used to treat the breast to reduce risks of local recurrence followed by antiestrogen therapy, if her margins are clear, Dr. Lisbeth Renshaw would not recommend radiotherapy. We discussed the risks, benefits, short, and long term effects of radiotherapy, as well as the curative intent, as well as a course of 4 or up to 6 1/2 weeks of radiotherapy to the right breast if this was needed. We will review her finally pathology in breast oncology conference and meet back if needed 3-4 weeks postoperatively.   In a visit lasting 60 minutes, greater than 50% of the time was spent face to face reviewing her case,  as well as in preparation of, discussing, and coordinating the patient's care.  The above documentation reflects my direct findings during this shared patient visit. Please see the separate note by Dr. Lisbeth Renshaw on this date for the remainder of the patient's plan of care.    Carola Rhine, [facility]    **Disclaimer: This note was dictated with voice recognition software. Similar sounding words can inadvertently be transcribed and this note may contain transcription errors which may not have been corrected upon publication of note.**

## 2021-03-24 ENCOUNTER — Telehealth: Payer: Self-pay

## 2021-03-24 ENCOUNTER — Telehealth: Payer: Self-pay | Admitting: Primary Care

## 2021-03-24 DIAGNOSIS — R0602 Shortness of breath: Secondary | ICD-10-CM

## 2021-03-24 NOTE — Telephone Encounter (Signed)
° °  Primary Cardiologist: Quay Burow, MD   Contacted patient on 03/24/21 as part of pre-operative protocol coverage. Sharon Vasquez was last seen in our office on 12/20/20 by [name], NP. Since that day, she states her symptoms have been stable and she has no worsening chest pain. She reports stable shortness of breath. She is unable to complete 4.0 METS as she is in a wheelchair. She understands that she is at high risk for MACE, but does not have any unstable cardiac conditions.   Per clinical pharmacist: [name]   Patient with diagnosis of atrial fibrillation on Xarelto for anticoagulation.    Procedure: breast lumpectomy Date of procedure: TBD     CHA2DS2-VASc Score = 5   This indicates a 7.2% annual risk of stroke. The patient's score is based upon: CHF History: 1 HTN History: 1 Diabetes History: 0 Stroke History: 0 Vascular Disease History: 1 Age Score: 1 Gender Score: 1   CrCl 101 Platelet count 341   Per office protocol, patient can hold Xarelto for 2 days prior to procedure.   Patient will not need bridging with Lovenox (enoxaparin) around procedure. Patient was advised that if she develops new symptoms prior to surgery to contact our office to arrange a follow-up appointment.  He verbalized understanding.  I will route this recommendation to the requesting party via Epic fax function and remove from pre-op pool.  Please call with questions.  Emmaline Life, NP-C    03/24/2021, 1:48 PM [place] [address] Office [phone] Fax [phone]

## 2021-03-24 NOTE — Telephone Encounter (Signed)
Patient with diagnosis of atrial fibrillation on Xarelto for anticoagulation.    Procedure: breast lumpectomy Date of procedure: TBD   CHA2DS2-VASc Score = 5   This indicates a 7.2% annual risk of stroke. The patient's score is based upon: CHF History: 1 HTN History: 1 Diabetes History: 0 Stroke History: 0 Vascular Disease History: 1 Age Score: 1 Gender Score: 1   CrCl 101 Platelet count 341  Per office protocol, patient can hold Xarelto for 2 days prior to procedure.   Patient will not need bridging with Lovenox (enoxaparin) around procedure.

## 2021-03-24 NOTE — Telephone Encounter (Signed)
° °  Pre-operative Risk Assessment    Patient Name: Sharon Vasquez  DOB: [birth year] [name]: [id number]      Request for Surgical Clearance    Procedure:   BREAST LUMPECTOMY  Date of Surgery:  Clearance TBD                                 Surgeon:  Jovita Kussmaul, MD Surgeon's Group or Practice Name:  [facility] ATTN: Carlene Coria, [place] Phone number:  [phone] Fax number:  [phone]   Type of Clearance Requested:   - Medical  - Pharmacy:  Hold Rivaroxaban (Xarelto) 2 DAYS PRIOR   Type of Anesthesia:  General  {

## 2021-03-25 NOTE — Telephone Encounter (Signed)
Sharon Ehrich, NP  03/17/2021  9:09 AM EST     Pre-op CXR showed stable chronic findings cardiomegaly and ILD. No acute findings. Still needs PFTs prior to surgical procedure    Patient is aware of results and voiced her understanding.  She stated that her first surgery is scheduled for 04/15/2021. She is requesting OV and PFT prior to 04/15/2021. First available PFT with our office is 04/21/2021. Per last OV note, patient also needs to see Dr. Elsworth Soho prior to surgery. He does not have any availability prior to 04/15/2021  PCC's, can PFT be scheduled sooner at [facility]? Dr. Elsworth Soho, please advise on appt? Thanks

## 2021-03-25 NOTE — Telephone Encounter (Signed)
Pt has been scheduled for pft in our office on 12/28.

## 2021-03-30 ENCOUNTER — Other Ambulatory Visit: Payer: Self-pay

## 2021-03-30 ENCOUNTER — Encounter: Payer: Self-pay | Admitting: *Deleted

## 2021-03-30 ENCOUNTER — Ambulatory Visit (INDEPENDENT_AMBULATORY_CARE_PROVIDER_SITE_OTHER): Payer: Medicare Other | Admitting: Pulmonary Disease

## 2021-03-30 ENCOUNTER — Telehealth: Payer: Self-pay | Admitting: *Deleted

## 2021-03-30 DIAGNOSIS — R0602 Shortness of breath: Secondary | ICD-10-CM

## 2021-03-30 LAB — PULMONARY FUNCTION TEST
DL/VA % pred: 106 %
DL/VA: 4.19 ml/min/mmHg/L
DLCO cor % pred: 67 %
DLCO cor: 16.44 ml/min/mmHg
DLCO unc % pred: 62 %
DLCO unc: 15.28 ml/min/mmHg
FEF 25-75 Post: 3.07 L/sec
FEF 25-75 Pre: 2.73 L/sec
FEF2575-%Change-Post: 12 %
FEF2575-%Pred-Post: 141 %
FEF2575-%Pred-Pre: 125 %
FEV1-%Change-Post: 5 %
FEV1-%Pred-Post: 78 %
FEV1-%Pred-Pre: 74 %
FEV1-Post: 1.94 L
FEV1-Pre: 1.84 L
FEV1FVC-%Change-Post: 3 %
FEV1FVC-%Pred-Pre: 114 %
FEV6-%Change-Post: 1 %
FEV6-%Pred-Post: 68 %
FEV6-%Pred-Pre: 67 %
FEV6-Post: 2.11 L
FEV6-Pre: 2.08 L
FEV6FVC-%Pred-Post: 103 %
FEV6FVC-%Pred-Pre: 103 %
FVC-%Change-Post: 1 %
FVC-%Pred-Post: 66 %
FVC-%Pred-Pre: 65 %
FVC-Post: 2.11 L
FVC-Pre: 2.08 L
Post FEV1/FVC ratio: 92 %
Post FEV6/FVC ratio: 100 %
Pre FEV1/FVC ratio: 89 %
Pre FEV6/FVC Ratio: 100 %
RV % pred: 38 %
RV: 0.99 L
TLC % pred: 54 %
TLC: 3.35 L

## 2021-03-30 NOTE — Telephone Encounter (Signed)
Spoke to pt regarding [place] from 03/23/21. Denies questions or concerns regarding dx or treatment care plan. Pt tolerating anti-estrogen oral therapy well. Currently at pulmonologist for pulmonary clearance prior to surgery. Encourage pt to call with needs. Received verbal understanding.

## 2021-03-30 NOTE — Progress Notes (Signed)
Full PFT completed today ? ?

## 2021-03-31 ENCOUNTER — Encounter (HOSPITAL_COMMUNITY): Payer: Self-pay | Admitting: Gastroenterology

## 2021-04-01 ENCOUNTER — Other Ambulatory Visit: Payer: Self-pay

## 2021-04-01 MED ORDER — TRELEGY ELLIPTA 100-62.5-25 MCG/ACT IN AEPB
1.0000 | INHALATION_SPRAY | Freq: Every day | RESPIRATORY_TRACT | 5 refills | Status: DC
Start: 2021-04-01 — End: 2021-11-16

## 2021-04-01 NOTE — Telephone Encounter (Signed)
Spk to patient about CXR results states she needs refill on Trelegy

## 2021-04-01 NOTE — Progress Notes (Signed)
Please let patient know CXR was stable, no acute findings. PFTs showed moderate restrictive lung disease likely d.t weight with mild diffusion defect. NO obstruction. Again she is an intermediate - high risk for surgery. I wanted her to see Dr. Elsworth Soho or me again before surgery

## 2021-04-03 DIAGNOSIS — I509 Heart failure, unspecified: Secondary | ICD-10-CM | POA: Diagnosis not present

## 2021-04-03 DIAGNOSIS — G4733 Obstructive sleep apnea (adult) (pediatric): Secondary | ICD-10-CM | POA: Diagnosis not present

## 2021-04-03 DIAGNOSIS — M170 Bilateral primary osteoarthritis of knee: Secondary | ICD-10-CM | POA: Diagnosis not present

## 2021-04-03 DIAGNOSIS — J9611 Chronic respiratory failure with hypoxia: Secondary | ICD-10-CM | POA: Diagnosis not present

## 2021-04-05 ENCOUNTER — Encounter: Payer: Self-pay | Admitting: *Deleted

## 2021-04-05 ENCOUNTER — Telehealth: Payer: Self-pay | Admitting: Hematology and Oncology

## 2021-04-05 NOTE — Telephone Encounter (Signed)
Scheduled per 1/3 sch msg, message was let with pt

## 2021-04-05 NOTE — Telephone Encounter (Signed)
Pt has already been scheduled for ov with RA on 1/4.  Nothing further needed.

## 2021-04-06 ENCOUNTER — Ambulatory Visit (INDEPENDENT_AMBULATORY_CARE_PROVIDER_SITE_OTHER): Payer: Commercial Managed Care - HMO | Admitting: Pulmonary Disease

## 2021-04-06 ENCOUNTER — Encounter: Payer: Self-pay | Admitting: Pulmonary Disease

## 2021-04-06 ENCOUNTER — Other Ambulatory Visit: Payer: Self-pay | Admitting: General Surgery

## 2021-04-06 ENCOUNTER — Other Ambulatory Visit: Payer: Self-pay

## 2021-04-06 VITALS — BP 133/76 | HR 85 | Temp 98.4°F | Ht 74.0 in | Wt 372.0 lb

## 2021-04-06 DIAGNOSIS — C50411 Malignant neoplasm of upper-outer quadrant of right female breast: Secondary | ICD-10-CM

## 2021-04-06 DIAGNOSIS — R69 Illness, unspecified: Secondary | ICD-10-CM | POA: Diagnosis not present

## 2021-04-06 DIAGNOSIS — J849 Interstitial pulmonary disease, unspecified: Secondary | ICD-10-CM

## 2021-04-06 DIAGNOSIS — R0602 Shortness of breath: Secondary | ICD-10-CM

## 2021-04-06 DIAGNOSIS — G4733 Obstructive sleep apnea (adult) (pediatric): Secondary | ICD-10-CM

## 2021-04-06 DIAGNOSIS — Z01811 Encounter for preprocedural respiratory examination: Secondary | ICD-10-CM

## 2021-04-06 DIAGNOSIS — Z170 Estrogen receptor positive status [ER+]: Secondary | ICD-10-CM

## 2021-04-06 DIAGNOSIS — J9611 Chronic respiratory failure with hypoxia: Secondary | ICD-10-CM

## 2021-04-06 DIAGNOSIS — I5032 Chronic diastolic (congestive) heart failure: Secondary | ICD-10-CM

## 2021-04-06 MED ORDER — AZITHROMYCIN 250 MG PO TABS: 250.0000 mg | ORAL_TABLET | Freq: Every day | ORAL | 0 refills | Status: DC

## 2021-04-06 MED ORDER — PREDNISONE 10 MG PO TABS: ORAL_TABLET | ORAL | 0 refills | Status: AC

## 2021-04-06 NOTE — Progress Notes (Signed)
Subjective:    Patient ID: Sharon Vasquez, female    DOB: 1950-09-24, 71 y.o.   [name]: [id number]  HPI  71 yo morbidly obese smoker for follow-up of ILD and OSA She smoked up to 2 packs/day,  now decreased to about 4-5 cigarettes daily   PMH - chronic diastolic heart failure.   -pulmonary embolism/ LLE DVT 2015  on Xarelto  s/p venoplasty and stenting of left common iliac and external iliac vein Duplex 05/2017 did not show any residual stenosis. -on Xarelto for chronic atrial fibrillation , amiodarone stopped 10/2019     Chief Complaint  Patient presents with   Follow-up    Follow up. Breast surg on 1/10 and 1/30. Pt says that breathing is fine. PFTs done a few days ago. Does wear CPAP.    She arrives for preop clearance.  She has an EGD scheduled for esophageal structure on 1/10 and she has breast surgery/lumpectomy scheduled for 1/30  She reports cough with small amount of yellow sputum production over the last few days, no fevers has not been tested for COVID or flu, no sick contacts  She has been able to obtain a pure wick and is sporadically compliant with diuretic depending on her activities, however her weight has dropped significantly from 394 previously to 372 pounds  We reviewed chest x-ray from 03/2021 which shows chronic ILD and no effusions  We reviewed PFTs today and previous echo She continues to smoke, a pack lasts her about a week She is compliant with CPAP and has oxygen blended into this, no problems with mask or pressure  Reviewed GI, oncology notes   Significant tests/ events reviewed   HRCT 05/2020 >> probable RB ILD, no worsening, enlarged pulmonary artery 4.4 cm, 4.4 cm ascending aortic aneurysm   HRCT 10/2019 >> Mild patchy ground-glass opacity and minimal subpleural reticulation without bronchiectasis or honeycombing. Extensive air trapping >> RBILD, enlarged PA   HRCT 12/2017 >> Mild interlobular septal thickening and mild patchy peribronchovascular  ground-glass opacity in both lungs. Minimal patchy subpleural reticulation in the upper lobes ? Pul edema vs RB-ILD   PFTs 03/2021 moderate restriction, ratio 89, FEV1 74%, FVC 65%, TLC 54%, DLCO 15.3/62%  PFTs 12/2017 - No obstruction, mild-mod restriction, decreased DLCO  FVC 2.16 (66%), FEV1 1.93 (75%), ratio 89, DLCOcor 16.45 (48%)   FENO 12/2017 5  Echo 10/2020 moderate LVH, RVSP 37  ECHO 07/2016 - EF 60-65%, PA peak pressure 41mm Hg   HST 12/2017 >> AHI 20/h   Esophagram 01/2020 >> Smooth strictured narrowing involving the distal esophagus near the GE junction   Past Medical History:  Diagnosis Date   A-fib [facility])    Anxiety    Arthritis    "qwhre; joints, back" (04/17/2017)   Benign breast cyst in female, left 01/08/2017   Found by Screening mammogram, evaluated by U/S on 01/08/17 and determined to be a benign simple breast cyst.   Breast cancer ([place])    Cellulitis of left lower leg 05/30/2017   CHF (congestive heart failure) (HCC)    Chronic low back pain 08/21/2016   Chronic lower back pain    Chronic venous insufficiency    /notes 05/30/2017   COPD (chronic obstructive pulmonary disease) (HCC)    Depression    DVT (deep venous thrombosis) ([place]) 11/16/2016   GERD (gastroesophageal reflux disease)    Headache    "weekly for the last 3 months" (04/17/2017)   Hyperlipidemia    Hypertension  Morbid obesity ([place])    PE (pulmonary embolism)    Pulmonary embolism ([place]) 09/21/2014   Sleep apnea      Review of Systems neg for any significant sore throat, dysphagia, itching, sneezing, nasal congestion or excess/ purulent secretions, fever, chills, sweats, unintended wt loss, pleuritic or exertional cp, hempoptysis, orthopnea pnd or change in chronic leg swelling. Also denies presyncope, palpitations, heartburn, abdominal pain, nausea, vomiting, diarrhea or change in bowel or urinary habits, dysuria,hematuria, rash, arthralgias, visual complaints, headache, numbness weakness or  ataxia.     Objective:   Physical Exam  Gen. Pleasant, obese, in no distress, normal affect, motorized wheelchair ENT - no pallor,icterus, no post nasal drip, class 2-3 airway Neck: No JVD, no thyromegaly, no carotid bruits Lungs: no use of accessory muscles, no dullness to percussion, decreased without rales , faint end expiratory rhonchi  Cardiovascular: Rhythm regular, heart sounds  normal, no murmurs or gallops, 2+ peripheral edema Abdomen: soft and non-tender, no hepatosplenomegaly, BS normal. Musculoskeletal: No deformities, no cyanosis or clubbing Neuro:  alert, non focal, no tremors       Assessment & Plan:   Preop evaluation -EGD and lumpectomy are minor surgeries.  EGD is to be performed under propofol sedation and lumpectomy under general anesthesia She would be at moderate risk given her comorbidities and would defer risk-benefit discussion to respective operators. Perioperatively, would recommend close monitoring of oxygen saturations and keep her on CPAP until she is fully recovered from anesthesia -Anticipate difficult airway   COPD exacerbation -she seems to be having an acute bronchitis and technically does not have COPD.  We will treat her with Z-Pak and short course of prednisone since she has surgery within a week

## 2021-04-06 NOTE — Assessment & Plan Note (Signed)
Related to smoking.  Does not need antifibrotic's Favor RB ILD on previous HRCT. Will obtain follow-up HRCT in February 2023

## 2021-04-06 NOTE — Assessment & Plan Note (Signed)
>>  ASSESSMENT AND PLAN FOR DIASTOLIC HEART FAILURE (HCC) WRITTEN ON 04/06/2021  1:22 PM BY ALVA, RAKESH V, MD  Continue diuretics with weight monitoring. She has mild pulmonary hypertension WHO class II/III doubt right heart cath necessary here

## 2021-04-06 NOTE — Assessment & Plan Note (Signed)
CPAP download was reviewed which shows excellent control of events on 13 cm with mild leak and good compliance.  CPAP is only helped improve her daytime somnolence and fatigue  Weight loss encouraged, compliance with goal of at least 4-6 hrs every night is the expectation. Advised against medications with sedative side effects Cautioned against driving when sleepy - understanding that sleepiness will vary on a day to day basis

## 2021-04-06 NOTE — Assessment & Plan Note (Signed)
Continue oxygen blended into CPAP

## 2021-04-06 NOTE — Assessment & Plan Note (Signed)
Continue diuretics with weight monitoring. She has mild pulmonary hypertension WHO class II/III doubt right heart cath necessary here

## 2021-04-06 NOTE — Patient Instructions (Addendum)
°  You are cleared for surgery with due risk  X Prednisone 10 mg tabs  Take 4 tabs daily with food x 3ds, then 2 tab daily with food x 3ds then STOP #20 X Zpak  Take mucinex 600 mg twice daily OTC  X HRCT chest in feb   You have to [place] smoking !!

## 2021-04-07 DIAGNOSIS — F17210 Nicotine dependence, cigarettes, uncomplicated: Secondary | ICD-10-CM | POA: Diagnosis not present

## 2021-04-07 DIAGNOSIS — R030 Elevated blood-pressure reading, without diagnosis of hypertension: Secondary | ICD-10-CM | POA: Diagnosis not present

## 2021-04-07 DIAGNOSIS — Z79899 Other long term (current) drug therapy: Secondary | ICD-10-CM | POA: Diagnosis not present

## 2021-04-07 DIAGNOSIS — E1165 Type 2 diabetes mellitus with hyperglycemia: Secondary | ICD-10-CM | POA: Diagnosis not present

## 2021-04-07 DIAGNOSIS — Z0000 Encounter for general adult medical examination without abnormal findings: Secondary | ICD-10-CM | POA: Diagnosis not present

## 2021-04-07 DIAGNOSIS — M170 Bilateral primary osteoarthritis of knee: Secondary | ICD-10-CM | POA: Diagnosis not present

## 2021-04-08 ENCOUNTER — Ambulatory Visit (INDEPENDENT_AMBULATORY_CARE_PROVIDER_SITE_OTHER): Payer: Medicare Other | Admitting: Internal Medicine

## 2021-04-08 ENCOUNTER — Encounter: Payer: Self-pay | Admitting: Internal Medicine

## 2021-04-08 ENCOUNTER — Other Ambulatory Visit: Payer: Self-pay

## 2021-04-08 DIAGNOSIS — E876 Hypokalemia: Secondary | ICD-10-CM | POA: Diagnosis not present

## 2021-04-08 DIAGNOSIS — E119 Type 2 diabetes mellitus without complications: Secondary | ICD-10-CM

## 2021-04-08 DIAGNOSIS — R69 Illness, unspecified: Secondary | ICD-10-CM | POA: Diagnosis not present

## 2021-04-08 NOTE — Patient Instructions (Addendum)
I want you to take 40 meq of your potassium supplement until Monday and then resume 20 meq after your procedure on Tuesday.  Lets plan to follow up mid to end of February!    Diabetes Mellitus Action Plan Following a diabetes action plan is a way for you to manage your diabetes (diabetes mellitus) symptoms. The plan is color-coded to help you understand what actions you need to take based on any symptoms you are having. If you have symptoms in the red zone, you need medical care right away. If you have symptoms in the yellow zone, you are having problems. If you have symptoms in the green zone, you are doing well. Learning about and understanding diabetes can take time. Follow the plan that you develop with your health care provider. Know the target range for your blood sugar (glucose) level, and review your treatment plan with your health care provider at each visit. The target range for my blood sugar level is __________________________ mg/dL. Red zone Get medical help right away if you have any of the following symptoms: A blood sugar test result that is below 54 mg/dL (3 mmol/L). A blood sugar test result that is at or above 240 mg/dL (13.3 mmol/L) for 2 days in a row. Confusion or trouble thinking clearly. Difficulty breathing. Sickness or a fever for 2 or more days that is not getting better. Moderate or large ketone levels in your urine. Feeling tired or having no energy. If you have any red zone symptoms, do not wait to see if the symptoms will go away. Get medical help right away. Call your local emergency services (911 in the [place] Do not drive yourself to the hospital. If you have severely low blood sugar (severe hypoglycemia) and you cannot eat or drink, you may need glucagon. Make sure a family member or close friend knows how to check your blood sugar and how to give you glucagon. You may need to be treated in a hospital for this condition. Yellow zone If you have any of the  following symptoms, your diabetes is not under control and you may need to make some changes: A blood sugar test result that is at or above 240 mg/dL (13.3 mmol/L) for 2 days in a row. Blood sugar test results that are below 70 mg/dL (3.9 mmol/L). Other symptoms of hypoglycemia, such as: Shaking or feeling light-headed. Confusion or irritability. Feeling hungry. Having a fast heartbeat. If you have any yellow zone symptoms: Treat your hypoglycemia by eating or drinking 15 grams of a rapid-acting carbohydrate. Follow the 15:15 rule: Take 15 grams of a rapid-acting carbohydrate, such as: 1 tube of glucose gel. 4 glucose pills. 4 oz (120 mL) of fruit juice. 4 oz (120 mL) of regular (not diet) soda. Check your blood sugar 15 minutes after you take the carbohydrate. If the repeat blood sugar test is still at or below 70 mg/dL (3.9 mmol/L), take 15 grams of a carbohydrate again. If your blood sugar does not increase above 70 mg/dL (3.9 mmol/L) after 3 tries, get medical help right away. After your blood sugar returns to normal, eat a meal or a snack within 1 hour. Keep taking your daily medicines as told by your health care provider. Check your blood sugar more often than you normally would. Write down your results. Call your health care provider if you have trouble keeping your blood sugar in your target range.  Green zone These signs mean you are doing well and you can  continue what you are doing to manage your diabetes: Your blood sugar is within your personal target range. For most people, a blood sugar level before a meal (preprandial) should be 80-130 mg/dL (4.4-7.2 mmol/L). You feel well, and you are able to do daily activities. If you are in the green zone, continue to manage your diabetes as told by your health care provider. To do this: Eat a healthy diet. Exercise regularly. Check your blood sugar as told by your health care provider. Take your medicines as told by your health  care provider.  Where to find more information American Diabetes Association (ADA): diabetes.org Association of Diabetes Care & Education Specialists (ADCES): diabeteseducator.org Summary Following a diabetes action plan is a way for you to manage your diabetes symptoms. The plan is color-coded to help you understand what actions you need to take based on any symptoms you are having. Follow the plan that you develop with your health care provider. Make sure you know your personal target blood sugar level. Review your treatment plan with your health care provider at each visit. This information is not intended to replace advice given to you by your health care provider. Make sure you discuss any questions you have with your health care provider. Document Revised: 09/25/2019 Document Reviewed: 09/25/2019 Elsevier Patient Education  [place].

## 2021-04-09 DIAGNOSIS — E119 Type 2 diabetes mellitus without complications: Secondary | ICD-10-CM | POA: Insufficient documentation

## 2021-04-09 DIAGNOSIS — E876 Hypokalemia: Secondary | ICD-10-CM | POA: Insufficient documentation

## 2021-04-09 DIAGNOSIS — R7303 Prediabetes: Secondary | ICD-10-CM | POA: Insufficient documentation

## 2021-04-09 NOTE — Assessment & Plan Note (Signed)
Patient recently saw her pain doctor who ordered several labs of which her hemoglobin a1c was 7.6. She brought in her lab results from [facility] (not in our system). She denies any polyuria or excessive thirst. Discussed the meaning of this hemoglobin A1c and diagnosis of diabetes. Patient states she would not like to start any new medications at this time as she is having an EGD Tuesday and also undergoing treatment for breast cancer at this time. She will pursue lifestyle modifications and dietary changes.   Assessment/plan: I think it is reasonable to proceed with lifestyle modifications and recheck a hemoglobin A1c in 3 months given patient is currently undergoing treatment for breast cancer.  -Follow up in 3 months for Hemoglobin a1C

## 2021-04-09 NOTE — Assessment & Plan Note (Signed)
K was 3.4 yesterday when she had her labs checked (not in our system). She has been taking potassium supplements daily, 20 meq. Will increase to 40 meq for 3 days and then to resume 20 meq daily. Will follow up in mid February after her EGD and lumpectomy to recheck.  -40 meq K supplementation for 3 days then to continue 20 meq daily -Follow up in 4-6 weeks

## 2021-04-09 NOTE — Progress Notes (Signed)
° °  CC: hypokalemia, new elevated hemoglobin a1c  HPI:  [name] is a 71 y.o. with a PMHx listed below presenting for follow up of low K and new diagnosis of type II DM. For details of today's visit and the status of his chronic medical issues please refer to the assessment and plan.   Past Medical History:  Diagnosis Date   A-fib [facility])    Anxiety    Arthritis    "qwhre; joints, back" (04/17/2017)   Benign breast cyst in female, left 01/08/2017   Found by Screening mammogram, evaluated by U/S on 01/08/17 and determined to be a benign simple breast cyst.   Breast cancer ([place])    Cellulitis of left lower leg 05/30/2017   CHF (congestive heart failure) (HCC)    Chronic low back pain 08/21/2016   Chronic lower back pain    Chronic venous insufficiency    /notes 05/30/2017   COPD (chronic obstructive pulmonary disease) (HCC)    Depression    DVT (deep venous thrombosis) ([place]) 11/16/2016   GERD (gastroesophageal reflux disease)    Headache    "weekly for the last 3 months" (04/17/2017)   Hyperlipidemia    Hypertension    Morbid obesity ([place])    PE (pulmonary embolism)    Pulmonary embolism ([place]) 09/21/2014   Sleep apnea    Review of Systems:   Review of Systems  Constitutional:  Negative for chills and fever.  Respiratory:  Positive for shortness of breath.   Cardiovascular:  Negative for chest pain and leg swelling.  Gastrointestinal:  Negative for nausea and vomiting.    Physical Exam:  Vitals:   04/08/21 0937  BP: 111/69  Pulse: 78  Temp: 98 F (36.7 C)  TempSrc: Oral  SpO2: 91%  Weight: (!) 369 lb 14.4 oz (167.8 kg)  Height: 6\' 2"  (1.88 m)   Physical Exam General: alert, appears stated age, in no acute distress HEENT: Normocephalic, atraumatic, EOM intact, conjunctiva normal CV: Regular rate and rhythm, no murmurs rubs or gallops Pulm: Clear to auscultation bilaterally, normal work of breathing, no wheezing Abdomen: Soft, nondistended, bowel sounds  present, no tenderness to palpation MSK: No lower extremity edema Skin: Warm and dry Neuro: Alert and oriented x3   Assessment & Plan:   See Encounters Tab for problem based charting.  Patient discussed with Dr.  Saverio Danker

## 2021-04-11 NOTE — Anesthesia Preprocedure Evaluation (Addendum)
Anesthesia Evaluation  Patient identified by [name], date of birth, ID band Patient awake    Reviewed: Allergy & Precautions, NPO status , Patient's Chart, lab work & pertinent test results, reviewed documented beta blocker date and time   Airway Mallampati: II  TM Distance: >3 FB Neck ROM: Full    Dental  (+) Missing, Poor Dentition, Dental Advisory Given, Chipped,    Pulmonary sleep apnea, Continuous Positive Airway Pressure Ventilation and Oxygen sleep apnea , COPD,  COPD inhaler, Current Smoker, PE (2016, xarelto- last dose: ) Current smoker, 110 pack year history - normally smokes about 4cigg/d Has been wheezing and SOB more over last few weeks Uses CPAP w/ O2 at night Uses oxygen when she gets short of breath and walking around sometimes 97% on 2LPM in preop Productive cough in preop- clear sputum   Pulmonary exam normal breath sounds clear to auscultation       Cardiovascular hypertension, Pt. on medications and Pt. on home beta blockers pulmonary hypertension (mild pHTN)+ Peripheral Vascular Disease  Normal cardiovascular exam+ dysrhythmias (xarelto last dose 3d ago) Atrial Fibrillation + Valvular Problems/Murmurs (mild-mod TR)  Rhythm:Regular Rate:Normal  Echo 10/2020: 1. Left ventricular ejection fraction, by estimation, is 60 to 65%. The  left ventricle has normal function. The left ventricle has no regional  wall motion abnormalities. The left ventricular internal cavity size was  mildly dilated. There is moderate  left ventricular hypertrophy. Left ventricular diastolic parameters are  indeterminate.  2. Right ventricular systolic function is normal. The right ventricular  size is mildly enlarged. There is mildly elevated pulmonary artery  systolic pressure. The estimated right ventricular systolic pressure is  25.6 mmHg.  3. Left atrial size was mildly dilated.  4. Right atrial size was mildly dilated.  5. The  mitral valve is normal in structure. No evidence of mitral valve  regurgitation.  6. Tricuspid valve regurgitation is mild to moderate.  7. The aortic valve is tricuspid. Aortic valve regurgitation is not  visualized. Mild to moderate aortic valve sclerosis/calcification is  present, without any evidence of aortic stenosis.  8. The inferior vena cava is dilated in size with >50% respiratory  variability, suggesting right atrial pressure of 8 mmHg.    Neuro/Psych  Headaches, PSYCHIATRIC DISORDERS Anxiety Depression    GI/Hepatic Neg liver ROS, GERD  Controlled and Medicated,  Endo/Other  diabetes, Well Controlled, Type 2Morbid obesityBMI 48 a1c 5.9  Renal/GU negative Renal ROS  negative genitourinary   Musculoskeletal  (+) Arthritis , Osteoarthritis,  Chronic LBP   Abdominal (+) + obese,   Peds  Hematology negative hematology ROS (+)   Anesthesia Other Findings   Reproductive/Obstetrics negative OB ROS                            Anesthesia Physical Anesthesia Plan  ASA: 4  Anesthesia Plan: MAC   Post-op Pain Management:    Induction:   PONV Risk Score and Plan: 2 and Propofol infusion and TIVA  Airway Management Planned: Natural Airway and Simple Face Mask  Additional Equipment: None  Intra-op Plan:   Post-operative Plan:   Informed Consent: I have reviewed the patients History and Physical, chart, labs and discussed the procedure including the risks, benefits and alternatives for the proposed anesthesia with the patient or authorized representative who has indicated his/her understanding and acceptance.     Dental advisory given  Plan Discussed with: CRNA  Anesthesia Plan Comments:  Anesthesia Quick Evaluation

## 2021-04-11 NOTE — Progress Notes (Signed)
Internal Medicine Clinic Attending ° °Case discussed with Dr. Rehman  At the time of the visit.  We reviewed the resident’s history and exam and pertinent patient test results.  I agree with the assessment, diagnosis, and plan of care documented in the resident’s note.  ° °

## 2021-04-12 ENCOUNTER — Ambulatory Visit (HOSPITAL_COMMUNITY): Payer: Medicare Other | Admitting: Anesthesiology

## 2021-04-12 ENCOUNTER — Encounter (HOSPITAL_COMMUNITY): Payer: Self-pay | Admitting: Gastroenterology

## 2021-04-12 ENCOUNTER — Ambulatory Visit (HOSPITAL_COMMUNITY)
Admission: RE | Admit: 2021-04-12 | Discharge: 2021-04-12 | Disposition: A | Discharge status: Home / Self Care | Payer: Medicare Other | Source: Ambulatory Visit | Attending: Gastroenterology | Admitting: Gastroenterology

## 2021-04-12 ENCOUNTER — Encounter (HOSPITAL_COMMUNITY)
Admission: RE | Disposition: A | Discharge status: Home / Self Care | Payer: Self-pay | Source: Ambulatory Visit | Attending: Gastroenterology

## 2021-04-12 ENCOUNTER — Other Ambulatory Visit: Payer: Self-pay

## 2021-04-12 DIAGNOSIS — K3189 Other diseases of stomach and duodenum: Secondary | ICD-10-CM | POA: Diagnosis not present

## 2021-04-12 DIAGNOSIS — K31A11 Gastric intestinal metaplasia without dysplasia, involving the antrum: Secondary | ICD-10-CM | POA: Diagnosis not present

## 2021-04-12 DIAGNOSIS — J849 Interstitial pulmonary disease, unspecified: Secondary | ICD-10-CM | POA: Diagnosis not present

## 2021-04-12 DIAGNOSIS — Z79899 Other long term (current) drug therapy: Secondary | ICD-10-CM | POA: Diagnosis not present

## 2021-04-12 DIAGNOSIS — Z86718 Personal history of other venous thrombosis and embolism: Secondary | ICD-10-CM | POA: Diagnosis not present

## 2021-04-12 DIAGNOSIS — I509 Heart failure, unspecified: Secondary | ICD-10-CM | POA: Insufficient documentation

## 2021-04-12 DIAGNOSIS — K31A19 Gastric intestinal metaplasia without dysplasia, unspecified site: Secondary | ICD-10-CM | POA: Diagnosis not present

## 2021-04-12 DIAGNOSIS — Z86711 Personal history of pulmonary embolism: Secondary | ICD-10-CM | POA: Diagnosis not present

## 2021-04-12 DIAGNOSIS — R933 Abnormal findings on diagnostic imaging of other parts of digestive tract: Secondary | ICD-10-CM | POA: Diagnosis not present

## 2021-04-12 DIAGNOSIS — Z993 Dependence on wheelchair: Secondary | ICD-10-CM | POA: Diagnosis not present

## 2021-04-12 DIAGNOSIS — K319 Disease of stomach and duodenum, unspecified: Secondary | ICD-10-CM | POA: Diagnosis not present

## 2021-04-12 DIAGNOSIS — R1310 Dysphagia, unspecified: Secondary | ICD-10-CM | POA: Diagnosis not present

## 2021-04-12 DIAGNOSIS — G4733 Obstructive sleep apnea (adult) (pediatric): Secondary | ICD-10-CM | POA: Diagnosis not present

## 2021-04-12 DIAGNOSIS — K2970 Gastritis, unspecified, without bleeding: Secondary | ICD-10-CM | POA: Insufficient documentation

## 2021-04-12 DIAGNOSIS — Z9989 Dependence on other enabling machines and devices: Secondary | ICD-10-CM | POA: Diagnosis not present

## 2021-04-12 DIAGNOSIS — Z7901 Long term (current) use of anticoagulants: Secondary | ICD-10-CM | POA: Insufficient documentation

## 2021-04-12 DIAGNOSIS — K224 Dyskinesia of esophagus: Secondary | ICD-10-CM | POA: Diagnosis not present

## 2021-04-12 DIAGNOSIS — I10 Essential (primary) hypertension: Secondary | ICD-10-CM | POA: Diagnosis not present

## 2021-04-12 DIAGNOSIS — I110 Hypertensive heart disease with heart failure: Secondary | ICD-10-CM | POA: Diagnosis not present

## 2021-04-12 HISTORY — PX: BIOPSY: SHX5522

## 2021-04-12 HISTORY — PX: SAVORY DILATION: SHX5439

## 2021-04-12 HISTORY — PX: ESOPHAGOGASTRODUODENOSCOPY (EGD) WITH PROPOFOL: SHX5813

## 2021-04-12 LAB — GLUCOSE, CAPILLARY: Glucose-Capillary: 120 mg/dL — ABNORMAL HIGH (ref 70–99)

## 2021-04-12 SURGERY — ESOPHAGOGASTRODUODENOSCOPY (EGD) WITH PROPOFOL
Anesthesia: Monitor Anesthesia Care

## 2021-04-12 MED ORDER — ALBUTEROL SULFATE HFA 108 (90 BASE) MCG/ACT IN AERS
INHALATION_SPRAY | RESPIRATORY_TRACT | Status: AC
  Filled 2021-04-12: qty 6.7

## 2021-04-12 MED ORDER — PROPOFOL 10 MG/ML IV BOLUS
INTRAVENOUS | Status: DC | PRN
Start: 2021-04-12 — End: 2021-04-12
  Administered 2021-04-12: 60 mg via INTRAVENOUS

## 2021-04-12 MED ORDER — LIDOCAINE HCL (CARDIAC) PF 100 MG/5ML IV SOSY
PREFILLED_SYRINGE | INTRAVENOUS | Status: DC | PRN
  Administered 2021-04-12: 100 mg via INTRAVENOUS

## 2021-04-12 MED ORDER — PROPOFOL 500 MG/50ML IV EMUL
INTRAVENOUS | Status: DC | PRN
  Administered 2021-04-12: 160 ug/kg/min via INTRAVENOUS

## 2021-04-12 MED ORDER — LACTATED RINGERS IV SOLN
INTRAVENOUS | Status: DC | PRN
Start: 2021-04-12 — End: 2021-04-12

## 2021-04-12 SURGICAL SUPPLY — 15 items

## 2021-04-12 NOTE — Transfer of Care (Signed)
Immediate Anesthesia Transfer of Care Note  Patient: Sharon Vasquez  Procedure(s) Performed: ESOPHAGOGASTRODUODENOSCOPY (EGD) WITH PROPOFOL SAVORY DILATION BIOPSY  Patient Location: PACU and Endoscopy Unit  Anesthesia Type:MAC  Level of Consciousness: awake, alert  and oriented  Airway & Oxygen Therapy: Patient Spontanous Breathing and Patient connected to face mask oxygen  Post-op Assessment: Report given to RN and Post -op Vital signs reviewed and stable  Post vital signs: Reviewed and stable  Last Vitals:  Vitals Value Taken Time  BP 115/39 04/12/21 1033  Temp 36.5 C 04/12/21 1033  Pulse 87 04/12/21 1035  Resp 24 04/12/21 1035  SpO2 97 % 04/12/21 1035  Vitals shown include unvalidated device data.  Last Pain:  Vitals:   04/12/21 1033  TempSrc: Oral  PainSc: 0-No pain         Complications: No notable events documented.

## 2021-04-12 NOTE — Anesthesia Postprocedure Evaluation (Signed)
Anesthesia Post Note  Patient: Sharon Vasquez  Procedure(s) Performed: ESOPHAGOGASTRODUODENOSCOPY (EGD) WITH PROPOFOL SAVORY DILATION BIOPSY     Patient location during evaluation: PACU Anesthesia Type: MAC Level of consciousness: awake and alert Pain management: pain level controlled Vital Signs Assessment: post-procedure vital signs reviewed and stable Respiratory status: spontaneous breathing, nonlabored ventilation and respiratory function stable Cardiovascular status: blood pressure returned to baseline and stable Postop Assessment: no apparent nausea or vomiting Anesthetic complications: no   No notable events documented.  Last Vitals:  Vitals:   04/12/21 1050 04/12/21 1055  BP: 137/82 (!) 149/62  Pulse: 82 84  Resp: 19 18  Temp:    SpO2: 95% 95%    Last Pain:  Vitals:   04/12/21 1055  TempSrc:   PainSc: 0-No pain                 Pervis Hocking

## 2021-04-12 NOTE — Discharge Instructions (Signed)

## 2021-04-12 NOTE — Op Note (Signed)
[facility] Patient Name: Ayanah Snader Procedure Date: 04/12/2021 [name]: [id number] Attending MD: Otis Brace , MD Date of Birth: 1950/08/19 CSN: [id number] Age: 71 Admit Type: Outpatient Procedure:                Upper GI endoscopy Indications:              Dysphagia, Abnormal cine-esophagram Providers:                Otis Brace, MD, Burtis Junes, RN, Frazier Richards,                            Technician Referring MD:              Medicines:                Sedation Administered by an Anesthesia Professional Complications:            No immediate complications. Estimated Blood Loss:     Estimated blood loss was minimal. Procedure:                Pre-Anesthesia Assessment:                           - Prior to the procedure, a History and Physical                            was performed, and patient medications and                            allergies were reviewed. The patient's tolerance of                            previous anesthesia was also reviewed. The risks                            and benefits of the procedure and the sedation                            options and risks were discussed with the patient.                            All questions were answered, and informed consent                            was obtained. Prior Anticoagulants: The patient has                            taken Xarelto (rivaroxaban), last dose was 2 days                            prior to procedure. ASA Grade Assessment: IV - A                            patient with severe systemic disease that is a  constant threat to life. After reviewing the risks                            and benefits, the patient was deemed in                            satisfactory condition to undergo the procedure.                           After obtaining informed consent, the endoscope was                            passed under direct vision. Throughout the                             procedure, the patient's blood pressure, pulse, and                            oxygen saturations were monitored continuously. The                            [id number]-H190 ([id number]) Olympus endoscope was introduced                            through the mouth, and advanced to the second part                            of duodenum. The upper GI endoscopy was                            accomplished without difficulty. The patient                            tolerated the procedure well. Scope In: Scope Out: Findings:      Abnormal motility was noted in the mid esophagus and in the distal       esophagus.      No endoscopic abnormality was evident in the esophagus to explain the       patient's complaint of dysphagia. It was decided, however, to proceed       with dilation of the entire esophagus. A guidewire was placed and the       scope was withdrawn. Dilation was performed with a Savary dilator with       no resistance at 17 mm. The dilation site was examined following       endoscope reinsertion and showed no bleeding, mucosal tear or       perforation.      Scattered mild inflammation characterized by erythema was found in the       gastric antrum and in the prepyloric region of the stomach. Biopsies       were taken with a cold forceps for histology.      The cardia and gastric fundus were normal on retroflexion.      The duodenal bulb, first portion of the duodenum and second portion of       the duodenum were normal. Impression:               -  Abnormal esophageal motility.                           - No endoscopic esophageal abnormality to explain                            patient's dysphagia. Esophagus dilated. Dilated.                           - Gastritis. Biopsied.                           - Normal duodenal bulb, first portion of the                            duodenum and second portion of the duodenum. Moderate Sedation:      Moderate (conscious) sedation was  personally administered by an       anesthesia professional. The following parameters were monitored: oxygen       saturation, heart rate, blood pressure, and response to care. Recommendation:           - Patient has a contact number available for                            emergencies. The signs and symptoms of potential                            delayed complications were discussed with the                            patient. Return to normal activities tomorrow.                            Written discharge instructions were provided to the                            patient.                           - Resume previous diet.                           - Continue present medications.                           - Await pathology results.                           - Return to GI office PRN. Procedure Code(s):        --- Professional ---                           [phone], Esophagogastroduodenoscopy, flexible,                            transoral; with insertion of guide wire followed by  passage of dilator(s) through esophagus over guide                            wire                           43239, 59, Esophagogastroduodenoscopy, flexible,                            transoral; with biopsy, single or multiple Diagnosis Code(s):        --- Professional ---                           K22.4, Dyskinesia of esophagus                           R13.10, Dysphagia, unspecified                           K29.70, Gastritis, unspecified, without bleeding                           R93.3, Abnormal findings on diagnostic imaging of                            other parts of digestive tract CPT copyright 2019 American Medical Association. All rights reserved. The codes documented in this report are preliminary and upon coder review may  be revised to meet current compliance requirements. Otis Brace, MD Otis Brace, MD 04/12/2021 10:34:53 AM Number of Addenda: 0

## 2021-04-12 NOTE — H&P (Signed)
Primary Care Physician:  [name], DO Primary Gastroenterologist:  Dr. Alessandra Bevels  Reason for Visit : EGD with dilation for dysphagia and abnormal barium swallow  HPI: Sharon Vasquez is a 71 y.o. female here for EGD with dilation for dysphagia and abnormal barium swallow showing possible mid esophageal stricture or motility disorder past medical history of interstitial lung disease, OSA, history of chronic diastolic heart failure, history of pulmonary embolism and DVT in 2015 currently on Xarelto. She uses wheelchair for ambulation. Was admitted to the hospital in June 2021 with CHF exacerbation. Echocardiogram at that time showed EF of 65-70%.        Patient was seen in September 2021 with similar symptoms. Barium swallow at that time showed esophageal dysmotility and lower esophageal stricture.        EGD in December 2021 showed irregular Z line and benign-appearing distal esophageal stricture. Dilation with balloon up to 18 mm was performed. GE junction biopsies were negative for Barrett's esophagus.        She is complaining of similar symptoms. Complaining of sensation of solids and liquids getting stuck and distal esophagus. Also complaining of early satiety and bloating within a few minutes of meals. She is not taking Nexium on a regular basis. Denies any diarrhea or constipation. Denies abdominal pain. Her weight is fluctuating. Denies blood in the stool or black stool.        Also has issues with poor dentition and according to her, dentist refusing to fix it because of her underlying comorbidities.        Had normal fiberoptic laryngoscopy with ENT. Was diagnosed with laryngeal and pharyngeal reflux.        FOBT negative in 2020 as well as in May 2021.        Blood work in June 2021 showed normal CBC and normal CMP except for creatinine of 1.14. BMP in July 2021 was normal with creatinine of 0.86.  Past Medical History:  Diagnosis Date   A-fib [facility])    Anxiety    Arthritis     "qwhre; joints, back" (04/17/2017)   Benign breast cyst in female, left 01/08/2017   Found by Screening mammogram, evaluated by U/S on 01/08/17 and determined to be a benign simple breast cyst.   Breast cancer ([place])    Cellulitis of left lower leg 05/30/2017   CHF (congestive heart failure) (HCC)    Chronic low back pain 08/21/2016   Chronic lower back pain    Chronic venous insufficiency    /notes 05/30/2017   COPD (chronic obstructive pulmonary disease) (HCC)    Depression    DVT (deep venous thrombosis) ([place]) 11/16/2016   GERD (gastroesophageal reflux disease)    Headache    "weekly for the last 3 months" (04/17/2017)   Hyperlipidemia    Hypertension    Morbid obesity ([place])    PE (pulmonary embolism)    Pulmonary embolism ([place]) 09/21/2014   Sleep apnea     Past Surgical History:  Procedure Laterality Date   ABDOMINAL HYSTERECTOMY     APPENDECTOMY     BALLOON DILATION N/A 03/09/2020   Procedure: BALLOON DILATION;  Surgeon: Otis Brace, MD;  Location: [facility] ENDOSCOPY;  Service: Gastroenterology;  Laterality: N/A;   BIOPSY  03/09/2020   Procedure: BIOPSY;  Surgeon: Otis Brace, MD;  Location: [facility] ENDOSCOPY;  Service: Gastroenterology;;   BREAST BIOPSY Right 03/11/2021   US biopsy/ coil clip/ path pending   BREAST BIOPSY Right 03/11/2021   US biopsy/  ribbone clip/ path pending   BREAST CYST EXCISION Left    "six o'clock"   BREAST LUMPECTOMY Left    CHOLECYSTECTOMY     DILATION AND CURETTAGE OF UTERUS     ESOPHAGOGASTRODUODENOSCOPY (EGD) WITH PROPOFOL N/A 03/09/2020   Procedure: ESOPHAGOGASTRODUODENOSCOPY (EGD) WITH PROPOFOL;  Surgeon: Otis Brace, MD;  Location: [facility] ENDOSCOPY;  Service: Gastroenterology;  Laterality: N/A;   IR ABLATE LIVER CRYOABLATION  07/23/2019   IR RADIOLOGIST EVAL & MGMT  07/18/2019   TONSILLECTOMY AND ADENOIDECTOMY     TUBAL LIGATION      Prior to Admission medications   Medication Sig Start Date End Date Taking? Authorizing Provider   albuterol (VENTOLIN HFA) 108 (90 Base) MCG/ACT inhaler Inhale 2 puffs into the lungs every 4 (four) hours as needed for shortness of breath. 09/18/20  Yes [provider]  aspirin 81 MG chewable tablet Chew 81 mg by mouth daily. 12/15/20  Yes [provider]  atorvastatin (LIPITOR) 80 MG tablet TAKE 1 TABLET(80 MG) BY MOUTH DAILY 10/08/20  Yes Jose Persia, MD  diclofenac Sodium (VOLTAREN) 1 % GEL Apply 2 g topically 4 (four) times daily. Patient taking differently: Apply 2 g topically daily as needed (Knee pain). 03/03/21  Yes Gaylan Gerold, DO  esomeprazole (NEXIUM) 40 MG capsule Take 1 capsule (40 mg total) by mouth daily as needed (for heartburn or indigestion). Patient taking differently: Take 40 mg by mouth daily. 01/20/19  Yes Marcelyn Bruins, MD  gabapentin (NEURONTIN) 400 MG capsule TAKE 1 CAPSULE BY MOUTH  TWICE DAILY 01/14/21  Yes Aslam, Sadia, MD  ipratropium-albuterol (DUONEB) 0.5-2.5 (3) MG/3ML SOLN Take 3 mLs by nebulization every 6 (six) hours as needed. Patient taking differently: Take 3 mLs by nebulization every 6 (six) hours as needed (shortness of breath/wheezing). 06/18/20  Yes Martyn Ehrich, NP  isosorbide mononitrate (IMDUR) 30 MG 24 hr tablet TAKE 1 TABLET(30 MG) BY MOUTH DAILY 01/11/21  Yes Atway, Rayann N, DO  letrozole (FEMARA) 2.5 MG tablet Take 1 tablet (2.5 mg total) by mouth daily. 03/23/21  Yes Nicholas Lose, MD  metolazone (ZAROXOLYN) 2.5 MG tablet Take 1 tablet (2.5 mg total) by mouth every other day. Patient taking differently: Take 2.5 mg by mouth daily as needed (if needed to enhance torsemide). 02/02/21  Yes Axel Filler, MD  metoprolol succinate (TOPROL XL) 25 MG 24 hr tablet Take 1 tablet (25 mg total) by mouth daily. 01/11/21  Yes Atway, Rayann N, DO  mirabegron ER (MYRBETRIQ) 50 MG TB24 tablet Take 1 tablet (50 mg total) by mouth daily. 02/16/21  Yes Katsadouros, Vasilios, MD  oxyCODONE (ROXICODONE) 15 MG immediate release  tablet Take 15 mg by mouth every 4 (four) hours as needed for pain. 08/05/20  Yes [provider]  OXYGEN Inhale 2-3 L into the lungs daily as needed (shortness of breath).   Yes [provider]  oxymetazoline (AFRIN) 0.05 % nasal spray Place 1 spray into both nostrils 2 (two) times daily as needed for congestion.   Yes [provider]  potassium chloride SA (KLOR-CON M) 20 MEQ tablet Take 1 tablet (20 mEq total) by mouth daily. 03/21/21 05/20/21 Yes Gaylan Gerold, DO  PRESCRIPTION MEDICATION Inhale into the lungs at bedtime. cpap   Yes [provider]  torsemide (DEMADEX) 20 MG tablet Take 2 tablets by mouth daily, you may take an extra 1/2 tablet only as needed for weight gain of 2 lbs overnight or 5 lbs in a week 10/22/20  Yes  Walker, Caitlin S, NP  XARELTO 20 MG TABS tablet TAKE 1 TABLET(20 MG) BY MOUTH EVERY MORNING 01/27/21  Yes Aslam, Sadia, MD  azithromycin (ZITHROMAX) 250 MG tablet Take 1 tablet (250 mg total) by mouth daily. 04/06/21   Rigoberto Noel, MD  Fluticasone-Umeclidin-Vilant (TRELEGY ELLIPTA) 100-62.5-25 MCG/ACT AEPB Inhale 1 puff into the lungs daily. 04/01/21   Martyn Ehrich, NP  guaiFENesin-dextromethorphan (ROBITUSSIN DM) 100-10 MG/5ML syrup Take 5 mLs by mouth every 4 (four) hours as needed for cough. 03/16/21   Martyn Ehrich, NP  predniSONE (DELTASONE) 10 MG tablet Take 4 tablets (40 mg total) by mouth daily with breakfast for 3 days, THEN 2 tablets (20 mg total) daily with breakfast for 3 days. 04/06/21 04/12/21  Rigoberto Noel, MD    Scheduled Meds: Continuous Infusions: PRN Meds:.  Allergies as of 02/18/2021   (No Known Allergies)    Family History  Problem Relation Age of Onset   Breast cancer Mother        before age 17   Hypertension Mother    Hyperlipidemia Mother    Hypertension Maternal Grandfather    Hyperlipidemia Maternal Grandfather     Social History   Socioeconomic History   Marital status: Divorced     Spouse name: Not on file   Number of children: Not on file   Years of education: Not on file   Highest education level: Not on file  Occupational History   Not on file  Tobacco Use   Smoking status: Every Day    Packs/day: 2.00    Years: 55.00    Pack years: 110.00    Types: Cigarettes    Start date: 08/16/1961    Last attempt to quit: 05/03/2020    Years since quitting: 0.9   Smokeless tobacco: Never  Vaping Use   Vaping Use: Never used  Substance and Sexual Activity   Alcohol use: No   Drug use: No   Sexual activity: Not Currently    Partners: Male  Other Topics Concern   Not on file  Social History Narrative   Lives in [facility] in [place]. Lives alone, but is dependent in ADLs/IADLs. 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 Two daughters live nearby. Previously resided in [place].   Social Determinants of Health   Financial Resource Strain: Not on file  Food Insecurity: No Food Insecurity   Worried About Charity fundraiser in the Last Year: Never true   Ran Out of Food in the Last Year: Never true  Transportation Needs: No Transportation Needs   Lack of Transportation (Medical): No   Lack of Transportation (Non-Medical): No  Physical Activity: Not on file  Stress: Not on file  Social Connections: Not on file  Intimate Partner Violence: Not on file    Review of Systems: All negative except as stated above in HPI.  Physical Exam: Vital signs: There were no vitals filed for this visit.   General:   Alert, morbidly obese pleasant and cooperative in NAD Lungs:  Clear throughout to auscultation.  Anterior exam only Heart: Irregularly irregular heart rate Abdomen: Left upper quadrant discomfort on palpation, abdomen is otherwise soft, bowel sounds present.  No peritoneal sign Rectal:  Deferred  GI:  Lab Results: No results for input(s): WBC, HGB, HCT, PLT in the last 72 hours. BMET No results for input(s): NA,  K, CL, CO2, GLUCOSE, BUN, CREATININE, CALCIUM in the last  72 hours. LFT No results for input(s): PROT, ALBUMIN, AST, ALT, ALKPHOS, BILITOT, BILIDIR, IBILI in the last 72 hours. PT/INR No results for input(s): LABPROT, INR in the last 72 hours.   Studies/Results: No results found.  Impression/Plan: -Abnormal barium swallow with 13 mm pill Stuck in her midesophagus could be related to dysmotility versus benign stricture in the distal esophagus -Dysphagia  Plan ----- -Proceed with EGD with possible dilation  Risks (bleeding, infection, bowel perforation that could require surgery, sedation-related changes in cardiopulmonary systems), benefits (identification and possible treatment of source of symptoms, exclusion of certain causes of symptoms), and alternatives (watchful waiting, radiographic imaging studies, empiric medical treatment)  were explained to patient/family in detail and patient wishes to proceed.     LOS: 0 days   Otis Brace  MD, [place] 04/12/2021, 9:18 AM  Contact #  [phone]

## 2021-04-13 LAB — SURGICAL PATHOLOGY

## 2021-04-14 ENCOUNTER — Other Ambulatory Visit: Payer: Self-pay | Admitting: Internal Medicine

## 2021-04-14 DIAGNOSIS — E876 Hypokalemia: Secondary | ICD-10-CM

## 2021-04-15 ENCOUNTER — Encounter (HOSPITAL_COMMUNITY): Payer: Self-pay | Admitting: Gastroenterology

## 2021-04-19 ENCOUNTER — Telehealth: Payer: Medicare Other

## 2021-04-19 DIAGNOSIS — J449 Chronic obstructive pulmonary disease, unspecified: Secondary | ICD-10-CM | POA: Diagnosis not present

## 2021-04-25 ENCOUNTER — Other Ambulatory Visit (INDEPENDENT_AMBULATORY_CARE_PROVIDER_SITE_OTHER): Payer: Medicare Other

## 2021-04-25 DIAGNOSIS — E876 Hypokalemia: Secondary | ICD-10-CM | POA: Diagnosis not present

## 2021-04-26 ENCOUNTER — Encounter (HOSPITAL_COMMUNITY): Payer: Self-pay

## 2021-04-26 ENCOUNTER — Ambulatory Visit: Payer: Self-pay

## 2021-04-26 LAB — POTASSIUM: Potassium: 4 mmol/L (ref 3.5–5.2)

## 2021-04-26 LAB — MAGNESIUM: Magnesium: 1.9 mg/dL (ref 1.6–2.3)

## 2021-04-26 NOTE — Chronic Care Management (AMB) (Signed)
Care Management    RN Visit Note  04/26/2021 Name: Sharon Vasquez [name]: [id number] DOB: 06/25/1950  Subjective: Sharon Vasquez is a 71 y.o. year old female who is a primary care patient of Atway, Rayann N, DO. The care management team was consulted for assistance with disease management and care coordination needs.    Engaged with patient by telephone for follow up visit in response to provider referral for case management and/or care coordination services.   Consent to Services:   Ms. Muscatello was given information about Care Management services today including:  Care Management services includes personalized support from designated clinical staff supervised by her physician, including individualized plan of care and coordination with other care providers 24/7 contact phone numbers for assistance for urgent and routine care needs. The patient may stop case management services at any time by phone call to the office staff.  Patient agreed to services and consent obtained.   Assessment: Review of patient past medical history, allergies, medications, health status, including review of consultants reports, laboratory and other test data, was performed as part of comprehensive evaluation and provision of chronic care management services.   SDOH (Social Determinants of Health) assessments and interventions performed:    Care Plan  No Known Allergies  Outpatient Encounter Medications as of 04/26/2021  Medication Sig Note   albuterol (VENTOLIN HFA) 108 (90 Base) MCG/ACT inhaler Inhale 2 puffs into the lungs every 4 (four) hours as needed for shortness of breath.    aspirin 81 MG chewable tablet Chew 81 mg by mouth daily. 03/16/2021: Med Classification: Hematological Agents   atorvastatin (LIPITOR) 80 MG tablet TAKE 1 TABLET(80 MG) BY MOUTH DAILY    azithromycin (ZITHROMAX) 250 MG tablet Take 1 tablet (250 mg total) by mouth daily. (Patient not taking: Reported on 04/21/2021)    diclofenac Sodium  (VOLTAREN) 1 % GEL Apply 2 g topically 4 (four) times daily. (Patient not taking: Reported on 04/21/2021)    esomeprazole (NEXIUM) 40 MG capsule Take 1 capsule (40 mg total) by mouth daily as needed (for heartburn or indigestion). (Patient taking differently: Take 40 mg by mouth daily.)    Fluticasone-Umeclidin-Vilant (TRELEGY ELLIPTA) 100-62.5-25 MCG/ACT AEPB Inhale 1 puff into the lungs daily.    gabapentin (NEURONTIN) 400 MG capsule TAKE 1 CAPSULE BY MOUTH  TWICE DAILY    guaiFENesin-dextromethorphan (ROBITUSSIN DM) 100-10 MG/5ML syrup Take 5 mLs by mouth every 4 (four) hours as needed for cough.    ibuprofen (ADVIL) 200 MG tablet Take 800 mg by mouth every 6 (six) hours as needed for moderate pain.    ipratropium-albuterol (DUONEB) 0.5-2.5 (3) MG/3ML SOLN Take 3 mLs by nebulization every 6 (six) hours as needed.    isosorbide mononitrate (IMDUR) 30 MG 24 hr tablet TAKE 1 TABLET(30 MG) BY MOUTH DAILY    letrozole (FEMARA) 2.5 MG tablet Take 1 tablet (2.5 mg total) by mouth daily.    metolazone (ZAROXOLYN) 2.5 MG tablet Take 1 tablet (2.5 mg total) by mouth every other day. (Patient taking differently: Take 2.5 mg by mouth daily as needed (extra fluid not covered relieved by torsemide).)    metoprolol succinate (TOPROL XL) 25 MG 24 hr tablet Take 1 tablet (25 mg total) by mouth daily.    mirabegron ER (MYRBETRIQ) 50 MG TB24 tablet Take 1 tablet (50 mg total) by mouth daily.    nicotine (NICODERM CQ - DOSED IN MG/24 HOURS) 21 mg/24hr patch Place 21 mg onto the skin daily.  oxyCODONE (ROXICODONE) 15 MG immediate release tablet Take 15 mg by mouth every 6 (six) hours as needed for pain.    OXYGEN Inhale 2-3 L into the lungs daily as needed (shortness of breath).    oxymetazoline (AFRIN) 0.05 % nasal spray Place 1 spray into both nostrils 2 (two) times daily as needed for congestion.    potassium chloride SA (KLOR-CON M) 20 MEQ tablet Take 1 tablet (20 mEq total) by mouth daily. (Patient taking  differently: Take 20 mEq by mouth 2 (two) times daily.)    PRESCRIPTION MEDICATION Inhale into the lungs at bedtime. cpap    torsemide (DEMADEX) 20 MG tablet Take 2 tablets by mouth daily, you may take an extra 1/2 tablet only as needed for weight gain of 2 lbs overnight or 5 lbs in a week (Patient taking differently: Take 20 mg by mouth 2 (two) times daily.)    XARELTO 20 MG TABS tablet TAKE 1 TABLET(20 MG) BY MOUTH EVERY MORNING    No facility-administered encounter medications on file as of 04/26/2021.    Patient Active Problem List   Diagnosis Date Noted   New onset type 2 diabetes mellitus ([place]) 04/09/2021   Hypokalemia 04/09/2021   Genetic testing 03/23/2021   Malignant neoplasm of upper-outer quadrant of right breast in female, estrogen receptor positive ([place]) 03/21/2021   Pre-operative respiratory examination 03/16/2021   Right foot pain 03/03/2021   Breast pain, right 01/17/2021   Chronic heart failure with preserved ejection fraction (HFpEF) (HCC)    Paroxysmal atrial fibrillation (HCC)    Osteoarthritis of left hip 10/07/2020   Venous stasis ulcer of left lower extremity ([place]) 08/18/2020   Hyperlipidemia 05/14/2020   Esophageal dysmotility 11/10/2019   Chronic respiratory failure with hypoxia ([place]) 09/30/2019   Mixed incontinence, urge and stress (female) (female) 08/21/2019   Preventative health care 06/04/2019   GERD (gastroesophageal reflux disease) 01/20/2019   ILD (interstitial lung disease) ([place]) 02/13/2018   OSA (obstructive sleep apnea) 11/14/2017   Chronic pain syndrome 07/27/2017   Major depression, recurrent, chronic ([place]) 02/01/2017   Osteoporosis 12/14/2016   Drug induced constipation 08/21/2016   Aortic atherosclerosis ([place]) 07/17/2016   Chronic venous insufficiency 07/12/2016   Chronic anticoagulation 07/12/2016   Tobacco use 07/11/2016   Acute exacerbation of chronic obstructive pulmonary disease (COPD) (HCC)    Diastolic heart failure ([place])    History  of pulmonary embolism    Atrial fibrillation with controlled ventricular response (HCC)    Vocal cord polyp 12/18/2015   Laryngopharyngeal reflux 12/18/2015   Morbid obesity ([place])    Peripheral vascular disease ([place])    Benign essential HTN    Primary osteoarthritis of both knees 09/16/2014    Conditions to be addressed/monitored: HTN, COPD, and DMII  Care Plan : CCM RN- COPD (Adult)  Updates made by Johnney Killian, RN since 04/26/2021 12:00 AM     Problem: Symptom Exacerbation (COPD)      Goal: Symptom Exacerbation Prevented or Minimized   Start Date: 06/24/2019  Expected End Date: 01/30/2021  Recent Progress: On track  Priority: High  Note:    Current Barriers: Successful outreach to patient this afternoon.  We discussed her upcoming breast cancer surgery.  Patient has preadmission lab testing tomorrow and her surgery is on 05/02/21.  Patient is feeling as good as possible with her upcoming procedure.  Patient was recently diagnosed with diabetes but does not want to start medication or treatment until after she has her breast cancer treatment.  Patient  is working on changing her diet and will follow up with her in a few weeks on her changes to diet. Knowledge Deficits related to plan of care for management of CHF, HTN, and COPD  Chronic Disease Management support and education needs related to CHF, HTN, and COPD  Lacks caregiver support Financial Constraints RNCM Clinical Goal(s):  Patient will verbalize understanding of plan for management of CHF, HTN, and COPD as evidenced by continued discussions with provider and RNCM. demonstrate Ongoing adherence to prescribed treatment plan for CHF, HTN, and COPD as evidenced by weight and hypertension monitoring. continue to work with RN Care Manager to address care management and care coordination needs related to  CHF, HTN, and COPD as evidenced by adherence to CM Team Scheduled appointments not experience hospital admission as  evidenced by review of EMR. Hospital Admissions in last 6 months = 0  through collaboration with RN Care manager, provider, and care team.  Interventions: 1:1 collaboration with primary care provider regarding development and update of comprehensive plan of care as evidenced by provider attestation and co-signature Inter-disciplinary care team collaboration (see longitudinal plan of care) Evaluation of current treatment plan related to  self management and patient's adherence to plan as established by provider Heart Failure Interventions:  (Status:  Goal on track:  NO.) Long Term Goal Basic overview and discussion of pathophysiology of Heart Failure reviewed Discussed importance of daily weight and advised patient to weigh and record daily Discussed the importance of keeping all appointments with provider COPD Interventions:  (Status:  Goal on track:  NO.) Long Term Goal Advised patient to track and manage COPD triggers Provided instruction about proper use of medications used for management of COPD including inhalers Discussed the importance of adequate rest and management of fatigue with COPD Hypertension Interventions:  (Status:  Goal on track:  Yes.) Long Term Goal Last practice recorded BP readings:  BP Readings from Last 3 Encounters:  04/12/21 (!) 149/62  04/08/21 111/69  04/06/21 133/76  Most recent eGFR/CrCl:  Lab Results  Component Value Date   EGFR 76 03/09/2021    No components found for: CRCL  Evaluation of current treatment plan related to hypertension self management and patient's adherence to plan as established by provider Reviewed medications with patient and discussed importance of compliance Discussed plans with patient for ongoing care management follow up and provided patient with direct contact information for care management team Advised patient, providing education and rationale, to monitor blood pressure daily and record, calling PCP for findings outside  established parameters Reviewed scheduled/upcoming provider appointments including:  Patient Goals/Self-Care Activities: Take all medications as prescribed Attend all scheduled provider appointments Call pharmacy for medication refills 3-7 days in advance of running out of medications Perform all self care activities independently  Perform IADL's (shopping, preparing meals, housekeeping, managing finances) independently Call provider office for new concerns or questions  call office if I gain more than 2 pounds in one day or 5 pounds in one week track weight in diary watch for swelling in feet, ankles and legs every day weigh myself daily begin a heart failure diary bring diary to all appointments develop a rescue plan identify and avoid work-related triggers begin a symptom diary follow rescue plan if symptoms flare-up keep follow-up appointments:   check blood pressure 3 times per week choose a place to take my blood pressure (home, clinic or office, retail store) write blood pressure results in a log or diary keep a blood pressure log call doctor  for signs and symptoms of high blood pressure keep all doctor appointments take medications for blood pressure exactly as prescribed     Plan: The patient has been provided with contact information for the care management team and has been advised to call with any health related questions or concerns.   Johnney Killian, RN, BSN, CCM Care Management Coordinator [facility] Medicine Phone: [phone]: [phone]

## 2021-04-26 NOTE — Pre-Procedure Instructions (Signed)
Surgical Instructions    Your procedure is scheduled on Monday 05/02/21.   Report to [facility] Main Entrance "A" at 10:00 A.M., then check in with the Admitting office.  Call this number if you have problems the morning of surgery:  [phone]   If you have any questions prior to your surgery date call [phone]: Open Monday-Friday 8am-4pm    Remember:  Do not eat after midnight the night before your surgery  You may drink clear liquids until 09:00 A.M. the morning of your surgery.   Clear liquids allowed are: Water, Non-Citrus Juices (without pulp), Carbonated Beverages, Clear Tea, Black Coffee ONLY (NO MILK, CREAM OR POWDERED CREAMER of any kind), and Gatorade    Take these medicines the morning of surgery with A SIP OF WATER   atorvastatin (LIPITOR)  esomeprazole (NEXIUM)  Fluticasone-Umeclidin-Vilant (TRELEGY ELLIPTA)   gabapentin (NEURONTIN)  isosorbide mononitrate (IMDUR)   metoprolol succinate (TOPROL XL)   mirabegron ER (MYRBETRIQ)   Take these medicines if needed:   albuterol (VENTOLIN HFA) 108 (90 Base)   ipratropium-albuterol (DUONEB)   oxyCODONE (ROXICODONE)   oxymetazoline (AFRIN)   Please follow your surgeon's instructions regarding XARELTO and letrozole [facility]).  If you have not received instructions then please contact your surgeon's office for instructions.   As of today, STOP taking any Aspirin (unless otherwise instructed by your surgeon) Aleve, Naproxen, Ibuprofen, Motrin, Advil, Goody's, BC's, all herbal medications, fish oil, and all vitamins.  After your COVID test   You are not required to quarantine however you are required to wear a well-fitting mask when you are out and around people not in your household.  If your mask becomes wet or soiled, replace with a new one.  Wash your hands often with soap and water for 20 seconds or clean your hands with an alcohol-based hand sanitizer that contains at least 60% alcohol.  Do not share personal  items.  Notify your provider: if you are in close contact with someone who has COVID  or if you develop a fever of 100.4 or greater, sneezing, cough, sore throat, shortness of breath or body aches.           Do not wear jewelry or makeup Do not wear lotions, powders, perfumes/colognes, or deodorant. Do not shave 48 hours prior to surgery.  Men may shave face and neck. Do not bring valuables to the hospital. DO Not wear nail polish, gel polish, artificial nails, or any other type of covering on natural nails (fingers and toes) If you have artificial nails or gel coating that need to be removed by a nail salon, please have this removed prior to surgery. Artificial nails or gel coating may interfere with anesthesia's ability to adequately monitor your vital signs.             [facility] is not responsible for any belongings or valuables.  Do NOT Smoke (Tobacco/Vaping)  24 hours prior to your procedure  If you use a CPAP at night, you may bring your mask for your overnight stay.   Contacts, glasses, hearing aids, dentures or partials may not be worn into surgery, please bring cases for these belongings   For patients admitted to the hospital, discharge time will be determined by your treatment team.   Patients discharged the day of surgery will not be allowed to drive home, and someone needs to stay with them for 24 hours.  NO VISITORS WILL BE ALLOWED IN PRE-OP WHERE PATIENTS ARE PREPPED FOR SURGERY.  ONLY 1 SUPPORT PERSON MAY BE PRESENT IN THE WAITING ROOM WHILE YOU ARE IN SURGERY.  IF YOU ARE TO BE ADMITTED, ONCE YOU ARE IN YOUR ROOM YOU WILL BE ALLOWED TWO (2) VISITORS. 1 (ONE) VISITOR MAY STAY OVERNIGHT BUT MUST ARRIVE TO THE ROOM BY 8pm.  Minor children may have two parents present. Special consideration for safety and communication needs will be reviewed on a case by case basis.  Special instructions:    Oral Hygiene is also important to reduce your risk of infection.  Remember -  BRUSH YOUR TEETH THE MORNING OF SURGERY WITH YOUR REGULAR TOOTHPASTE   [place]- Preparing For Surgery  Before surgery, you can play an important role. Because skin is not sterile, your skin needs to be as free of germs as possible. You can reduce the number of germs on your skin by washing with CHG (chlorahexidine gluconate) Soap before surgery.  CHG is an antiseptic cleaner which kills germs and bonds with the skin to continue killing germs even after washing.     Please do not use if you have an allergy to CHG or antibacterial soaps. If your skin becomes reddened/irritated stop using the CHG.  Do not shave (including legs and underarms) for at least 48 hours prior to first CHG shower. It is OK to shave your face.  Please follow these instructions carefully.     Shower the NIGHT BEFORE SURGERY and the MORNING OF SURGERY with CHG Soap.   If you chose to wash your hair, wash your hair first as usual with your normal shampoo. After you shampoo, rinse your hair and body thoroughly to remove the shampoo.  Then [employer] and genitals (private parts) with your normal soap and rinse thoroughly to remove soap.  After that Use CHG Soap as you would any other liquid soap. You can apply CHG directly to the skin and wash gently with a scrungie or a clean washcloth.   Apply the CHG Soap to your body ONLY FROM THE NECK DOWN.  Do not use on open wounds or open sores. Avoid contact with your eyes, ears, mouth and genitals (private parts). Wash Face and genitals (private parts)  with your normal soap.   Wash thoroughly, paying special attention to the area where your surgery will be performed.  Thoroughly rinse your body with warm water from the neck down.  DO NOT shower/wash with your normal soap after using and rinsing off the CHG Soap.  Pat yourself dry with a CLEAN TOWEL.  Wear CLEAN PAJAMAS to bed the night before surgery  Place CLEAN SHEETS on your bed the night before your surgery  DO NOT  SLEEP WITH PETS.   Day of Surgery:  Take a shower with CHG soap. Wear Clean/Comfortable clothing the morning of surgery Do not apply any deodorants/lotions.   Remember to brush your teeth WITH YOUR REGULAR TOOTHPASTE.   Please read over the following fact sheets that you were given.

## 2021-04-26 NOTE — Patient Instructions (Signed)
Visit Information  Thank you for taking time to visit with me today. Please don't hesitate to contact me if I can be of assistance to you before our next scheduled telephone appointment.  Our next appointment is by telephone on 05/17/21 at 1:30.  Please call the care guide team at [phone] if you need to cancel or reschedule your appointment.   If you are experiencing a Mental Health or [place] or need someone to talk to, please call the [place] National Suicide Prevention Lifeline: 707-385-5191 or TTY: (818)303-7541 TTY (949)115-1997) to talk to a trained counselor   Patient verbalizes understanding of instructions and care plan provided today and agrees to view in [place]. Active MyChart status confirmed with patient.    The patient has been provided with contact information for the care management team and has been advised to call with any health related questions or concerns.   Johnney Killian, RN, BSN, CCM Care Management Coordinator [facility] Internal Medicine Phone: [phone]: [phone]

## 2021-04-27 ENCOUNTER — Encounter (HOSPITAL_COMMUNITY): Payer: Self-pay

## 2021-04-27 ENCOUNTER — Encounter (HOSPITAL_COMMUNITY)
Admission: RE | Admit: 2021-04-27 | Discharge: 2021-04-27 | Disposition: A | Discharge status: Home / Self Care | Payer: Medicare Other | Source: Ambulatory Visit | Attending: General Surgery | Admitting: General Surgery

## 2021-04-27 ENCOUNTER — Other Ambulatory Visit: Payer: Self-pay

## 2021-04-27 VITALS — BP 162/87 | HR 85 | Temp 97.7°F | Resp 19 | Ht 74.0 in | Wt 377.6 lb

## 2021-04-27 DIAGNOSIS — I4891 Unspecified atrial fibrillation: Secondary | ICD-10-CM | POA: Diagnosis not present

## 2021-04-27 DIAGNOSIS — Z86711 Personal history of pulmonary embolism: Secondary | ICD-10-CM | POA: Insufficient documentation

## 2021-04-27 DIAGNOSIS — G4733 Obstructive sleep apnea (adult) (pediatric): Secondary | ICD-10-CM | POA: Insufficient documentation

## 2021-04-27 DIAGNOSIS — Z01818 Encounter for other preprocedural examination: Secondary | ICD-10-CM

## 2021-04-27 DIAGNOSIS — I2510 Atherosclerotic heart disease of native coronary artery without angina pectoris: Secondary | ICD-10-CM | POA: Insufficient documentation

## 2021-04-27 DIAGNOSIS — Z9989 Dependence on other enabling machines and devices: Secondary | ICD-10-CM | POA: Insufficient documentation

## 2021-04-27 DIAGNOSIS — E119 Type 2 diabetes mellitus without complications: Secondary | ICD-10-CM | POA: Insufficient documentation

## 2021-04-27 DIAGNOSIS — Z01812 Encounter for preprocedural laboratory examination: Secondary | ICD-10-CM | POA: Diagnosis not present

## 2021-04-27 DIAGNOSIS — I739 Peripheral vascular disease, unspecified: Secondary | ICD-10-CM | POA: Diagnosis not present

## 2021-04-27 DIAGNOSIS — Z993 Dependence on wheelchair: Secondary | ICD-10-CM | POA: Insufficient documentation

## 2021-04-27 DIAGNOSIS — Z86718 Personal history of other venous thrombosis and embolism: Secondary | ICD-10-CM | POA: Diagnosis not present

## 2021-04-27 DIAGNOSIS — Z7901 Long term (current) use of anticoagulants: Secondary | ICD-10-CM | POA: Diagnosis not present

## 2021-04-27 DIAGNOSIS — J849 Interstitial pulmonary disease, unspecified: Secondary | ICD-10-CM | POA: Insufficient documentation

## 2021-04-27 DIAGNOSIS — I5030 Unspecified diastolic (congestive) heart failure: Secondary | ICD-10-CM | POA: Diagnosis not present

## 2021-04-27 DIAGNOSIS — F17210 Nicotine dependence, cigarettes, uncomplicated: Secondary | ICD-10-CM | POA: Insufficient documentation

## 2021-04-27 DIAGNOSIS — I110 Hypertensive heart disease with heart failure: Secondary | ICD-10-CM | POA: Diagnosis not present

## 2021-04-27 HISTORY — DX: Type 2 diabetes mellitus without complications: E11.9

## 2021-04-27 HISTORY — DX: Cardiac arrhythmia, unspecified: I49.9

## 2021-04-27 LAB — COMPREHENSIVE METABOLIC PANEL
ALT: 18 U/L (ref 0–44)
AST: 21 U/L (ref 15–41)
Albumin: 3.7 g/dL (ref 3.5–5.0)
Alkaline Phosphatase: 66 U/L (ref 38–126)
Anion gap: 10 (ref 5–15)
BUN: 12 mg/dL (ref 8–23)
CO2: 23 mmol/L (ref 22–32)
Calcium: 9.1 mg/dL (ref 8.9–10.3)
Chloride: 105 mmol/L (ref 98–111)
Creatinine, Ser: 1.04 mg/dL — ABNORMAL HIGH (ref 0.44–1.00)
GFR, Estimated: 57 mL/min — ABNORMAL LOW (ref >60)
Glucose, Bld: 166 mg/dL — ABNORMAL HIGH (ref 70–99)
Potassium: 3.3 mmol/L — ABNORMAL LOW (ref 3.5–5.1)
Sodium: 138 mmol/L (ref 135–145)
Total Bilirubin: 0.7 mg/dL (ref 0.3–1.2)
Total Protein: 7.1 g/dL (ref 6.5–8.1)

## 2021-04-27 LAB — CBC
HCT: 38.4 % (ref 36.0–46.0)
Hemoglobin: 13.2 g/dL (ref 12.0–15.0)
MCH: 29.4 pg (ref 26.0–34.0)
MCHC: 34.4 g/dL (ref 30.0–36.0)
MCV: 85.5 fL (ref 80.0–100.0)
Platelets: 381 10*3/uL (ref 150–400)
RBC: 4.49 MIL/uL (ref 3.87–5.11)
RDW: 13.3 % (ref 11.5–15.5)
WBC: 4.9 10*3/uL (ref 4.0–10.5)
nRBC: 0 % (ref 0.0–0.2)

## 2021-04-27 LAB — GLUCOSE, CAPILLARY: Glucose-Capillary: 167 mg/dL — ABNORMAL HIGH (ref 70–99)

## 2021-04-27 NOTE — Progress Notes (Addendum)
PCP - Dr. Lorenda Ishihara Cardiologist - Dr. Quay Burow Pulmonologist- Dr. Kara Mead  PPM/ICD - n/a Device Orders - n/a Rep Notified - n/a  Chest x-ray - 03/17/21 EKG - 10/22/20 Stress Test - denies ECHO - 10/08/20 Cardiac Cath - denies  Sleep Study - 2019. +OSA CPAP - Wears CPAP nightly. Does not know pressure settings.   CBG today= 167. Patient states she was recently diagnosed with Type 2 diabetes a few weeks ago. Per Dr. Olevia Perches note on 04/08/21 A1 C was 7.6 at recent MD visit. Patient does not check blood sugar at home and is not currently on medication for diabetes.    Blood Thinner Instructions: Pateint states she was instructed to stop Xarelto 2 days prior to surgery.  Aspirin Instructions: As of today stop taking Aspirin unless otherwise instructed by your surgeon.   ERAS Protcol - Yes PRE-SURGERY Ensure or G2- None ordered.  COVID TEST- Ambulatory Surgery.    Anesthesia review: Yes. Cardiac History. Clearance note from Dr. Elsworth Soho on 04/06/21  Patient denies shortness of breath, fever, cough and chest pain at PAT appointment   All instructions explained to the patient, with a verbal understanding of the material. Patient agrees to go over the instructions while at home for a better understanding. Patient also instructed to self quarantine after being tested for COVID-19. The opportunity to ask questions was provided.

## 2021-04-28 ENCOUNTER — Telehealth: Payer: Self-pay | Admitting: *Deleted

## 2021-04-28 NOTE — Telephone Encounter (Signed)
Received call from pt to review instructions on when to stop taking Xarelto prior to upcoming surgery on Monday 05/02/21.  Per MD, pt to stop taking Xarelto 2 days prior to tx and to resume the day after tx.  Pt educated and verbalized understanding.

## 2021-04-28 NOTE — Anesthesia Preprocedure Evaluation (Addendum)
Anesthesia Evaluation  Patient identified by [name], date of birth, ID band Patient awake    Reviewed: Allergy & Precautions, NPO status , Patient's Chart, lab work & pertinent test results  Airway Mallampati: III  TM Distance: >3 FB Neck ROM: Full    Dental no notable dental hx. (+) Poor Dentition, Chipped, Missing, Loose,    Pulmonary sleep apnea and Continuous Positive Airway Pressure Ventilation , COPD, Current Smoker and Patient abstained from smoking.,  Current smoker, 110 pack year history - normally smokes about 4cigg/d Has been wheezing and SOB more over last few weeks Uses CPAP w/ O2 at night Uses oxygen when she gets short of breath and walking around sometimes 97% on 2LPM in preop Productive cough in preop- clear sputum   Pulmonary exam normal breath sounds clear to auscultation       Cardiovascular hypertension, +CHF  + dysrhythmias Atrial Fibrillation  Rhythm:Regular Rate:Normal     Neuro/Psych  Headaches, PSYCHIATRIC DISORDERS Anxiety Depression    GI/Hepatic Neg liver ROS, GERD  ,  Endo/Other  diabetes, Type 2  Renal/GU negative Renal ROS  negative genitourinary   Musculoskeletal  (+) Arthritis , Osteoarthritis,    Abdominal   Peds negative pediatric ROS (+)  Hematology negative hematology ROS (+)   Anesthesia Other Findings   Reproductive/Obstetrics negative OB ROS                            Anesthesia Physical Anesthesia Plan  ASA: 4  Anesthesia Plan: General   Post-op Pain Management:    Induction: Intravenous  PONV Risk Score and Plan: 3 and Ondansetron and Treatment may vary due to age or medical condition  Airway Management Planned: Oral ETT, LMA and Video Laryngoscope Planned  Additional Equipment: None  Intra-op Plan:   Post-operative Plan: Extubation in OR  Informed Consent:   Plan Discussed with: Anesthesiologist and CRNA  Anesthesia Plan  Comments: (PAT note by Karoline Caldwell, PA-C: Follows with pulmonology for history of ILD and OSA on CPAP and nocturnal O2.  Previously smoked 2 packs/day for many years, now down to 4 to 5 cigarettes daily. PFTs 03/2021 moderate restriction, ratio 89, FEV1 74%, FVC 65%, TLC 54%, DLCO 15.3/62%.  Last seen by Dr. Elsworth Soho 04/06/2021 for preop evaluation.  Per note, "Preop evaluation-EGD and lumpectomyareminor surgeries. EGD is to be performed under propofol sedation and lumpectomy under general anesthesia. She would be at moderate risk given her comorbidities and would defer risk-benefit discussion to respective operators. Perioperatively, would recommend close monitoring of oxygen saturations and keep her on CPAP until she is fully recovered from anesthesia -Anticipate difficult airway."  Patient was also noted to having acute bronchitis at that time was treated with Z-Pak and course of prednisone.  Follows with cardiology for history of HTN, diastolic CHF, PE/LLE DVT 3419 maintained on Xarelto, A. fib, CAD by CCTA in 2020 treated with medical therapy, PAD s/p venoplasty and stenting of the left common iliac and external iliac vein.  She has cardiac clearance per telephone encounter 03/09/2021, "Chart reviewed as part of pre-operative protocol coverage. Patient was contacted12/7/2022in reference to pre-operative risk assessment for pending surgery as outlined below. Sharon Vasquez last seen on9/19/22by [name] NP. Since that day, Sharon Vasquez done well.She has CAD by CCTA in 2020 treated with medical therapy. She is unable to complete 4.0 METS, she is in a wheelchair. She does not require insulin and does not have a history of  stroke. She understand she is at high risk for MACE, but does not have any unstable cardiac conditions.  Per our clinical pharmacist: Request is to hold Xarelto for 3 days prior to procedure. 3 day DOAC hold is not required prior to endoscopy, pt has normal renal function.  Recommend holding Xarelto for 1-2 days max and resume as soon as safely possible. Therefore, based on ACC/AHA guidelines, the patient would be at acceptable risk for the planned procedure without further cardiovascular testing."  Of note, Dr. Elsworth Soho stated in his preop evaluation note that he expects patient may have a difficult airway.  Per telephone encounter 04/28/2021, patient was instructed to hold Xarelto 2 days prior to procedure.  Recent diagnosis of DM2, per PCP note 04/08/2021 patient had labs done by her pain specialist at [place] and A1c was 7.6.  No medications were started at that time.  Lifestyle modifications and changes recommended.  Preop labs reviewed, unremarkable.  EKG 10/07/2020: Atrial fibrillation.  Rate 95. Abnormal R-wave progression, late transition. LVH with secondary repolarization abnormality. Prolonged QT interval (QTC 540)  CHEST - 2 VIEW 03/16/2021:  COMPARISON: 10/07/2020  FINDINGS: Stable moderate cardiomegaly. Diffuse coarsening of interstitial lung markings is unchanged, consistent with chronic interstitial lung disease. No evidence of pulmonary consolidation or pleural effusion.  IMPRESSION: Stable cardiomegaly and chronic interstitial lung disease. No acute findings.  Echo 10/08/20 1. Left ventricular ejection fraction, by estimation, is 60 to 65%. The  left ventricle has normal function. The left ventricle has no regional  wall motion abnormalities. The left ventricular internal cavity size was  mildly dilated. There is moderate  left ventricular hypertrophy. Left ventricular diastolic parameters are  indeterminate.  2. Right ventricular systolic function is normal. The right ventricular  size is mildly enlarged. There is mildly elevated pulmonary artery  systolic pressure. The estimated right ventricular systolic pressure is  93.2 mmHg.  3. Left atrial size was mildly dilated.  4. Right atrial size was mildly dilated.  5. The  mitral valve is normal in structure. No evidence of mitral valve  regurgitation.  6. Tricuspid valve regurgitation is mild to moderate.  7. The aortic valve is tricuspid. Aortic valve regurgitation is not  visualized. Mild to moderate aortic valve sclerosis/calcification is  present, without any evidence of aortic stenosis.  8. The inferior vena cava is dilated in size with >50% respiratory  variability, suggesting right atrial pressure of 8 mmHg.   Coronary CTA 05/07/2018: Impressions: 1. Coronary calcium score of 2110. This was 4 percentile for age and sex matched control. 2. Normal coronary origin with left dominance. 3. Moderate to severe diffuse CAD with stenosis 50-69% in the proximal LAD, distal LCX arteries and suspicion for a severe stenosis in the mid portion of the 1. diagonal artery. Additional analysis with CT FFR will be submitted. 4. Moderately dilated pulmonary artery measuring 37 mm suspicious of pulmonary hypertension.  FFRct analysis was performed on the original cardiac CT angiogram dataset. Diagrammatic representation of the FFRct analysis is provided in a separate PDF document in PACS. This dictation was created using the PDF document and an interactive 3D model of the results. 3D model is not available in the EMR/PACS. Normal FFR range is >0.80.  1. Left Main: No significant stenosis. 2. LAD: No significant stenosis. 3. D1: No significant stenosis. 4. Ramus intermedius: No significant stenosis. 5. LCX: No significant stenosis. 6. RCA: Proximal: 1.0, mid 0.61.  Impressions: 1. CT FFR analysis showed significant stenosis in the proximal portion  of a small non-dominant RCA. An aggressive medical management is recommended.   )       Anesthesia Quick Evaluation

## 2021-04-28 NOTE — Progress Notes (Signed)
Anesthesia Chart Review:  Follows with pulmonology for history of ILD and OSA on CPAP and nocturnal O2.  Previously smoked 2 packs/day for many years, now down to 4 to 5 cigarettes daily. PFTs 03/2021 moderate restriction, ratio 89, FEV1 74%, FVC 65%, TLC 54%, DLCO 15.3/62%.  Last seen by Dr. Elsworth Soho 04/06/2021 for preop evaluation.  Per note, "Preop evaluation -EGD and lumpectomy are minor surgeries.  EGD is to be performed under propofol sedation and lumpectomy under general anesthesia. She would be at moderate risk given her comorbidities and would defer risk-benefit discussion to respective operators. Perioperatively, would recommend close monitoring of oxygen saturations and keep her on CPAP until she is fully recovered from anesthesia -Anticipate difficult airway."  Patient was also noted to having acute bronchitis at that time was treated with Z-Pak and course of prednisone.  Follows with cardiology for history of HTN, diastolic CHF, PE/LLE DVT 2130 maintained on Xarelto, A. fib, CAD by CCTA in 2020 treated with medical therapy, PAD s/p venoplasty and stenting of the left common iliac and external iliac vein.  She has cardiac clearance per telephone encounter 03/09/2021, "Chart reviewed as part of pre-operative protocol coverage. Patient was contacted 03/09/2021 in reference to pre-operative risk assessment for pending surgery as outlined below.  Sharon Vasquez was last seen on 12/20/20 by [name] NP.  Since that day, Sharon Vasquez has done well. She has CAD by CCTA in 2020 treated with medical therapy. She is unable to complete 4.0 METS, she is in a wheelchair. She does not require insulin and does not have a history of stroke. She understand she is at high risk for MACE, but does not have any unstable cardiac conditions.  Per our clinical pharmacist: Request is to hold Xarelto for 3 days prior to procedure. 3 day DOAC hold is not required prior to endoscopy, pt has normal renal function. Recommend  holding Xarelto for 1-2 days max and resume as soon as safely possible. Therefore, based on ACC/AHA guidelines, the patient would be at acceptable risk for the planned procedure without further cardiovascular testing."  Of note, Dr. Elsworth Soho stated in his preop evaluation note that he expects patient may have a difficult airway.  Per telephone encounter 04/28/2021, patient was instructed to hold Xarelto 2 days prior to procedure.  Recent diagnosis of DM2, per PCP note 04/08/2021 patient had labs done by her pain specialist at [place] and A1c was 7.6.  No medications were started at that time.  Lifestyle modifications and changes recommended.  Preop labs reviewed, unremarkable.  EKG 10/07/2020: Atrial fibrillation.  Rate 95. Abnormal R-wave progression, late transition. LVH with secondary repolarization abnormality. Prolonged QT interval (QTC 540)  CHEST - 2 VIEW 03/16/2021:   COMPARISON:  10/07/2020   FINDINGS: Stable moderate cardiomegaly. Diffuse coarsening of interstitial lung markings is unchanged, consistent with chronic interstitial lung disease. No evidence of pulmonary consolidation or pleural effusion.   IMPRESSION: Stable cardiomegaly and chronic interstitial lung disease. No acute findings.  Echo 10/08/20 1. Left ventricular ejection fraction, by estimation, is 60 to 65%. The  left ventricle has normal function. The left ventricle has no regional  wall motion abnormalities. The left ventricular internal cavity size was  mildly dilated. There is moderate  left ventricular hypertrophy. Left ventricular diastolic parameters are  indeterminate.   2. Right ventricular systolic function is normal. The right ventricular  size is mildly enlarged. There is mildly elevated pulmonary artery  systolic pressure. The estimated right ventricular systolic pressure  is  36.9 mmHg.   3. Left atrial size was mildly dilated.   4. Right atrial size was mildly dilated.   5. The mitral valve  is normal in structure. No evidence of mitral valve  regurgitation.   6. Tricuspid valve regurgitation is mild to moderate.   7. The aortic valve is tricuspid. Aortic valve regurgitation is not  visualized. Mild to moderate aortic valve sclerosis/calcification is  present, without any evidence of aortic stenosis.   8. The inferior vena cava is dilated in size with >50% respiratory  variability, suggesting right atrial pressure of 8 mmHg.   Coronary CTA 05/07/2018: Impressions: 1. Coronary calcium score of 2110. This was 62 percentile for age and sex matched control. 2. Normal coronary origin with left dominance. 3. Moderate to severe diffuse CAD with stenosis 50-69% in the proximal LAD, distal LCX arteries and suspicion for a severe stenosis in the mid portion of the 1. diagonal artery. Additional analysis with CT FFR will be submitted. 4. Moderately dilated pulmonary artery measuring 37 mm suspicious of pulmonary hypertension.   FFRct analysis was performed on the original cardiac CT angiogram dataset. Diagrammatic representation of the FFRct analysis is provided in a separate PDF document in PACS. This dictation was created using the PDF document and an interactive 3D model of the results. 3D model is not available in the EMR/PACS. Normal FFR range is >0.80.   1. Left Main:  No significant stenosis. 2. LAD: No significant stenosis. 3. D1: No significant stenosis. 4. Ramus intermedius: No significant stenosis. 5. LCX: No significant stenosis. 6. RCA: Proximal: 1.0, mid 0.61.   Impressions: 1. CT FFR analysis showed significant stenosis in the proximal portion of a small non-dominant RCA. An aggressive medical management is recommended.    Sharon Vasquez [facility]/Anesthesiology Phone [phone] 04/28/2021 12:53 PM

## 2021-04-29 ENCOUNTER — Ambulatory Visit
Admission: RE | Admit: 2021-04-29 | Discharge: 2021-04-29 | Disposition: A | Discharge status: Home / Self Care | Payer: Medicare Other | Source: Ambulatory Visit | Attending: General Surgery | Admitting: General Surgery

## 2021-04-29 DIAGNOSIS — C50411 Malignant neoplasm of upper-outer quadrant of right female breast: Secondary | ICD-10-CM

## 2021-04-29 DIAGNOSIS — C50911 Malignant neoplasm of unspecified site of right female breast: Secondary | ICD-10-CM | POA: Diagnosis not present

## 2021-04-29 DIAGNOSIS — Z170 Estrogen receptor positive status [ER+]: Secondary | ICD-10-CM

## 2021-05-02 ENCOUNTER — Ambulatory Visit (HOSPITAL_COMMUNITY)
Admission: RE | Admit: 2021-05-02 | Discharge: 2021-05-02 | Disposition: A | Discharge status: Home / Self Care | Payer: Medicare Other | Attending: General Surgery | Admitting: General Surgery

## 2021-05-02 ENCOUNTER — Ambulatory Visit
Admission: RE | Admit: 2021-05-02 | Discharge: 2021-05-02 | Disposition: A | Discharge status: Home / Self Care | Payer: Medicare Other | Source: Ambulatory Visit | Attending: General Surgery | Admitting: General Surgery

## 2021-05-02 ENCOUNTER — Ambulatory Visit
Admission: RE | Admit: 2021-05-02 | Discharge: 2021-05-02 | Disposition: A | Discharge status: Home / Self Care | Payer: Commercial Managed Care - HMO | Source: Ambulatory Visit | Attending: General Surgery | Admitting: General Surgery

## 2021-05-02 ENCOUNTER — Other Ambulatory Visit: Payer: Self-pay | Admitting: General Surgery

## 2021-05-02 ENCOUNTER — Encounter (HOSPITAL_COMMUNITY): Payer: Self-pay | Admitting: General Surgery

## 2021-05-02 ENCOUNTER — Ambulatory Visit (HOSPITAL_COMMUNITY): Payer: Medicare Other | Admitting: Anesthesiology

## 2021-05-02 ENCOUNTER — Other Ambulatory Visit: Payer: Self-pay

## 2021-05-02 ENCOUNTER — Ambulatory Visit (HOSPITAL_COMMUNITY): Payer: Medicare Other | Admitting: Physician Assistant

## 2021-05-02 ENCOUNTER — Encounter (HOSPITAL_COMMUNITY)
Admission: RE | Disposition: A | Discharge status: Home / Self Care | Payer: Self-pay | Source: Home / Self Care | Attending: General Surgery

## 2021-05-02 DIAGNOSIS — I110 Hypertensive heart disease with heart failure: Secondary | ICD-10-CM | POA: Insufficient documentation

## 2021-05-02 DIAGNOSIS — Z853 Personal history of malignant neoplasm of breast: Secondary | ICD-10-CM

## 2021-05-02 DIAGNOSIS — E119 Type 2 diabetes mellitus without complications: Secondary | ICD-10-CM | POA: Insufficient documentation

## 2021-05-02 DIAGNOSIS — Z9981 Dependence on supplemental oxygen: Secondary | ICD-10-CM | POA: Insufficient documentation

## 2021-05-02 DIAGNOSIS — Z170 Estrogen receptor positive status [ER+]: Secondary | ICD-10-CM | POA: Insufficient documentation

## 2021-05-02 DIAGNOSIS — F32A Depression, unspecified: Secondary | ICD-10-CM | POA: Insufficient documentation

## 2021-05-02 DIAGNOSIS — Z86711 Personal history of pulmonary embolism: Secondary | ICD-10-CM | POA: Diagnosis not present

## 2021-05-02 DIAGNOSIS — F419 Anxiety disorder, unspecified: Secondary | ICD-10-CM | POA: Diagnosis not present

## 2021-05-02 DIAGNOSIS — J449 Chronic obstructive pulmonary disease, unspecified: Secondary | ICD-10-CM | POA: Diagnosis not present

## 2021-05-02 DIAGNOSIS — C50411 Malignant neoplasm of upper-outer quadrant of right female breast: Secondary | ICD-10-CM

## 2021-05-02 DIAGNOSIS — K219 Gastro-esophageal reflux disease without esophagitis: Secondary | ICD-10-CM | POA: Insufficient documentation

## 2021-05-02 DIAGNOSIS — R928 Other abnormal and inconclusive findings on diagnostic imaging of breast: Secondary | ICD-10-CM | POA: Diagnosis not present

## 2021-05-02 DIAGNOSIS — I4891 Unspecified atrial fibrillation: Secondary | ICD-10-CM | POA: Diagnosis not present

## 2021-05-02 DIAGNOSIS — I509 Heart failure, unspecified: Secondary | ICD-10-CM | POA: Diagnosis not present

## 2021-05-02 DIAGNOSIS — F17210 Nicotine dependence, cigarettes, uncomplicated: Secondary | ICD-10-CM | POA: Diagnosis not present

## 2021-05-02 DIAGNOSIS — C50911 Malignant neoplasm of unspecified site of right female breast: Secondary | ICD-10-CM | POA: Diagnosis not present

## 2021-05-02 HISTORY — PX: BREAST LUMPECTOMY WITH RADIOACTIVE SEED LOCALIZATION: SHX6424

## 2021-05-02 LAB — GLUCOSE, CAPILLARY
Glucose-Capillary: 130 mg/dL — ABNORMAL HIGH (ref 70–99)
Glucose-Capillary: 110 mg/dL — ABNORMAL HIGH (ref 70–99)

## 2021-05-02 SURGERY — BREAST LUMPECTOMY WITH RADIOACTIVE SEED LOCALIZATION
Anesthesia: General | Site: Breast | Laterality: Right

## 2021-05-02 MED ORDER — CELECOXIB 200 MG PO CAPS
200.0000 mg | ORAL_CAPSULE | ORAL | Status: AC
  Administered 2021-05-02: 200 mg via ORAL
  Filled 2021-05-02: qty 1

## 2021-05-02 MED ORDER — ORAL CARE MOUTH RINSE: 15.0000 mL | Freq: Once | OROMUCOSAL | Status: AC

## 2021-05-02 MED ORDER — ONDANSETRON HCL 4 MG/2ML IJ SOLN: 4.0000 mg | Freq: Once | INTRAMUSCULAR | Status: DC | PRN

## 2021-05-02 MED ORDER — LACTATED RINGERS IV SOLN: INTRAVENOUS | Status: DC

## 2021-05-02 MED ORDER — LIDOCAINE HCL (CARDIAC) PF 100 MG/5ML IV SOSY
PREFILLED_SYRINGE | INTRAVENOUS | Status: DC | PRN
Start: 2021-05-02 — End: 2021-05-02
  Administered 2021-05-02: 100 mg via INTRAVENOUS

## 2021-05-02 MED ORDER — OXYCODONE HCL 5 MG/5ML PO SOLN: 5.0000 mg | Freq: Once | ORAL | Status: AC | PRN

## 2021-05-02 MED ORDER — ACETAMINOPHEN 160 MG/5ML PO SOLN: 325.0000 mg | ORAL | Status: DC | PRN

## 2021-05-02 MED ORDER — ACETAMINOPHEN 325 MG PO TABS: 325.0000 mg | ORAL_TABLET | ORAL | Status: DC | PRN

## 2021-05-02 MED ORDER — FENTANYL CITRATE (PF) 100 MCG/2ML IJ SOLN
INTRAMUSCULAR | Status: AC
  Filled 2021-05-02: qty 2

## 2021-05-02 MED ORDER — PHENYLEPHRINE HCL-NACL 20-0.9 MG/250ML-% IV SOLN
INTRAVENOUS | Status: DC | PRN
  Administered 2021-05-02: 100 ug/min via INTRAVENOUS

## 2021-05-02 MED ORDER — ONDANSETRON HCL 4 MG/2ML IJ SOLN
INTRAMUSCULAR | Status: DC | PRN
Start: 2021-05-02 — End: 2021-05-02
  Administered 2021-05-02: 4 mg via INTRAVENOUS

## 2021-05-02 MED ORDER — ALBUTEROL SULFATE (2.5 MG/3ML) 0.083% IN NEBU
2.5000 mg | INHALATION_SOLUTION | Freq: Once | RESPIRATORY_TRACT | Status: AC
  Administered 2021-05-02: 2.5 mg via RESPIRATORY_TRACT
  Filled 2021-05-02: qty 3

## 2021-05-02 MED ORDER — GABAPENTIN 300 MG PO CAPS
300.0000 mg | ORAL_CAPSULE | ORAL | Status: AC
  Administered 2021-05-02: 300 mg via ORAL
  Filled 2021-05-02: qty 1

## 2021-05-02 MED ORDER — ACETAMINOPHEN 500 MG PO TABS
1000.0000 mg | ORAL_TABLET | ORAL | Status: AC
  Administered 2021-05-02: 1000 mg via ORAL
  Filled 2021-05-02: qty 2

## 2021-05-02 MED ORDER — CHLORHEXIDINE GLUCONATE CLOTH 2 % EX PADS: 6.0000 | MEDICATED_PAD | Freq: Once | CUTANEOUS | Status: DC

## 2021-05-02 MED ORDER — DEXAMETHASONE SODIUM PHOSPHATE 4 MG/ML IJ SOLN
INTRAMUSCULAR | Status: DC | PRN
  Administered 2021-05-02: 5 mg via INTRAVENOUS

## 2021-05-02 MED ORDER — MEPERIDINE HCL 25 MG/ML IJ SOLN: 6.2500 mg | INTRAMUSCULAR | Status: DC | PRN

## 2021-05-02 MED ORDER — FENTANYL CITRATE (PF) 100 MCG/2ML IJ SOLN: 25.0000 ug | INTRAMUSCULAR | Status: DC | PRN

## 2021-05-02 MED ORDER — ALBUTEROL SULFATE (2.5 MG/3ML) 0.083% IN NEBU
INHALATION_SOLUTION | RESPIRATORY_TRACT | Status: AC
  Filled 2021-05-02: qty 3

## 2021-05-02 MED ORDER — CHLORHEXIDINE GLUCONATE CLOTH 2 % EX PADS
6.0000 | MEDICATED_PAD | Freq: Once | CUTANEOUS | Status: DC
Start: 2021-05-02 — End: 2021-05-02

## 2021-05-02 MED ORDER — BUPIVACAINE-EPINEPHRINE 0.25% -1:200000 IJ SOLN
INTRAMUSCULAR | Status: DC | PRN
  Administered 2021-05-02: 20 mL

## 2021-05-02 MED ORDER — OXYCODONE HCL 5 MG PO TABS: 5.0000 mg | ORAL_TABLET | Freq: Four times a day (QID) | ORAL | 0 refills | Status: DC | PRN

## 2021-05-02 MED ORDER — FENTANYL CITRATE (PF) 100 MCG/2ML IJ SOLN
INTRAMUSCULAR | Status: DC | PRN
  Administered 2021-05-02 (×2): 25 ug via INTRAVENOUS

## 2021-05-02 MED ORDER — CHLORHEXIDINE GLUCONATE 0.12 % MT SOLN
15.0000 mL | Freq: Once | OROMUCOSAL | Status: AC
  Administered 2021-05-02: 15 mL via OROMUCOSAL
  Filled 2021-05-02: qty 15

## 2021-05-02 MED ORDER — GLYCOPYRROLATE PF 0.2 MG/ML IJ SOSY
PREFILLED_SYRINGE | INTRAMUSCULAR | Status: DC | PRN
  Administered 2021-05-02: .1 mg via INTRAVENOUS

## 2021-05-02 MED ORDER — OXYCODONE HCL 5 MG/5ML PO SOLN: 5.0000 mg | Freq: Once | ORAL | Status: DC | PRN

## 2021-05-02 MED ORDER — CEFAZOLIN IN SODIUM CHLORIDE 3-0.9 GM/100ML-% IV SOLN
3.0000 g | INTRAVENOUS | Status: AC
  Administered 2021-05-02: 3 g via INTRAVENOUS
  Filled 2021-05-02: qty 100

## 2021-05-02 MED ORDER — OXYCODONE HCL 5 MG PO TABS: 5.0000 mg | ORAL_TABLET | Freq: Once | ORAL | Status: DC | PRN

## 2021-05-02 MED ORDER — PROPOFOL 10 MG/ML IV BOLUS
INTRAVENOUS | Status: DC | PRN
  Administered 2021-05-02: 200 mg via INTRAVENOUS

## 2021-05-02 MED ORDER — OXYCODONE HCL 5 MG PO TABS
5.0000 mg | ORAL_TABLET | Freq: Once | ORAL | Status: AC | PRN
  Administered 2021-05-02: 5 mg via ORAL

## 2021-05-02 MED ORDER — BUPIVACAINE-EPINEPHRINE (PF) 0.25% -1:200000 IJ SOLN
INTRAMUSCULAR | Status: AC
  Filled 2021-05-02: qty 30

## 2021-05-02 MED ORDER — FENTANYL CITRATE (PF) 100 MCG/2ML IJ SOLN
25.0000 ug | INTRAMUSCULAR | Status: DC | PRN
  Administered 2021-05-02: 25 ug via INTRAVENOUS

## 2021-05-02 MED ORDER — OXYCODONE HCL 5 MG PO TABS
ORAL_TABLET | ORAL | Status: AC
  Filled 2021-05-02: qty 1

## 2021-05-02 MED ORDER — 0.9 % SODIUM CHLORIDE (POUR BTL) OPTIME
TOPICAL | Status: DC | PRN
  Administered 2021-05-02: 1000 mL

## 2021-05-02 SURGICAL SUPPLY — 36 items
ADH SKN CLS APL DERMABOND .7 (GAUZE/BANDAGES/DRESSINGS) ×1
APL PRP STRL LF DISP 70% ISPRP (MISCELLANEOUS) ×1
APPLIER CLIP 9.375 MED OPEN (MISCELLANEOUS)
APR CLP MED 9.3 20 MLT OPN (MISCELLANEOUS)
BAG COUNTER SPONGE SURGICOUNT (BAG) IMPLANT
BAG SPNG CNTER NS LX DISP (BAG)
BINDER BREAST LRG (GAUZE/BANDAGES/DRESSINGS) IMPLANT
BINDER BREAST XLRG (GAUZE/BANDAGES/DRESSINGS) IMPLANT
CANISTER SUCT 3000ML PPV (MISCELLANEOUS) ×3 IMPLANT
CHLORAPREP W/TINT 26 (MISCELLANEOUS) ×3 IMPLANT
CLIP APPLIE 9.375 MED OPEN (MISCELLANEOUS) IMPLANT
COVER PROBE W GEL 5X96 (DRAPES) ×3 IMPLANT
COVER SURGICAL LIGHT HANDLE (MISCELLANEOUS) ×3 IMPLANT
DERMABOND ADVANCED (GAUZE/BANDAGES/DRESSINGS) ×1
DERMABOND ADVANCED .7 DNX12 (GAUZE/BANDAGES/DRESSINGS) ×2 IMPLANT
DEVICE DUBIN SPECIMEN MAMMOGRA (MISCELLANEOUS) ×3 IMPLANT
DRAPE CHEST BREAST 15X10 FENES (DRAPES) ×3 IMPLANT
ELECT COATED BLADE 2.86 ST (ELECTRODE) ×3 IMPLANT
ELECT REM PT RETURN 9FT ADLT (ELECTROSURGICAL) ×2
ELECTRODE REM PT RTRN 9FT ADLT (ELECTROSURGICAL) ×2 IMPLANT
GLOVE SURG ENC MOIS LTX SZ7.5 (GLOVE) ×6 IMPLANT
GOWN STRL REUS W/ TWL LRG LVL3 (GOWN DISPOSABLE) ×4 IMPLANT
GOWN STRL REUS W/TWL LRG LVL3 (GOWN DISPOSABLE) ×4
KIT BASIN OR (CUSTOM PROCEDURE TRAY) ×3 IMPLANT
KIT MARKER MARGIN INK (KITS) ×3 IMPLANT
LIGHT WAVEGUIDE WIDE FLAT (MISCELLANEOUS) IMPLANT
NDL HYPO 25GX1X1/2 BEV (NEEDLE) ×2 IMPLANT
NEEDLE HYPO 25GX1X1/2 BEV (NEEDLE) ×2 IMPLANT
NS IRRIG 1000ML POUR BTL (IV SOLUTION) ×3 IMPLANT
PACK GENERAL/GYN (CUSTOM PROCEDURE TRAY) ×3 IMPLANT
SUT MNCRL AB 4-0 PS2 18 (SUTURE) ×3 IMPLANT
SUT SILK 2 0 SH (SUTURE) IMPLANT
SUT VIC AB 3-0 SH 18 (SUTURE) ×3 IMPLANT
SYR CONTROL 10ML LL (SYRINGE) ×3 IMPLANT
TOWEL GREEN STERILE (TOWEL DISPOSABLE) ×3 IMPLANT
TOWEL GREEN STERILE FF (TOWEL DISPOSABLE) ×3 IMPLANT

## 2021-05-02 NOTE — Anesthesia Postprocedure Evaluation (Signed)
Anesthesia Post Note  Patient: Sharon Vasquez  Procedure(s) Performed: RIGHT BREAST LUMPECTOMY WITH RADIOACTIVE SEED LOCALIZATION (Right: Breast)     Patient location during evaluation: PACU Anesthesia Type: General Level of consciousness: awake and alert Pain management: pain level controlled Vital Signs Assessment: post-procedure vital signs reviewed and stable Respiratory status: spontaneous breathing, nonlabored ventilation, respiratory function stable and patient connected to nasal cannula oxygen Cardiovascular status: blood pressure returned to baseline and stable Postop Assessment: no apparent nausea or vomiting Anesthetic complications: no   No notable events documented.  Last Vitals:  Vitals:   05/02/21 1400 05/02/21 1415  BP: 137/86 126/76  Pulse: 93 85  Resp: 18 16  Temp:  36.8 C  SpO2: 92% 96%    Last Pain:  Vitals:   05/02/21 1415  TempSrc:   PainSc: 2                  Yianna Tersigni

## 2021-05-02 NOTE — Anesthesia Procedure Notes (Signed)
Procedure Name: LMA Insertion Date/Time: 05/02/2021 12:53 PM Performed by: Michele Rockers, CRNA Pre-anesthesia Checklist: Patient identified, Patient being monitored, Timeout performed, Emergency Drugs available and Suction available Patient Re-evaluated:Patient Re-evaluated prior to induction Oxygen Delivery Method: Circle system utilized Preoxygenation: Pre-oxygenation with 100% oxygen Induction Type: IV induction LMA: LMA inserted LMA Size: 4.0 Number of attempts: 1 Placement Confirmation: positive ETCO2 and breath sounds checked- equal and bilateral Tube secured with: Tape Dental Injury: Teeth and Oropharynx as per pre-operative assessment

## 2021-05-02 NOTE — H&P (Signed)
PROVIDER: Landry Corporal, MD  [name]: [id number] DOB: Sep 21, 1950 Subjective   Chief Complaint: Breast Cancer   History of Present Illness: Sharon Vasquez is a 71 y.o. female who is seen today as an office consultation at the request of Dr. Pasty Arch for evaluation of Breast Cancer .   We are asked to see the patient in consultation by Dr. Lindi Adie to evaluate her for a new right breast cancer. The patient is a 71 year old black female who initially presented with pain in both breasts. On her work-up she was found to have a mass and calcifications in the upper outer central right breast. The 2 areas measured 1.7 cm and 9 mm. The axilla looked negative. The mass was biopsied and came back as an invasive ductal cancer grade 2 that was ER and PR positive and HER2 negative with a Ki-67 of 5%. She is still smoking 1 to 2 packs of cigarettes a day. She has a past history significant for congestive heart failure and COPD. She is on home O2. She is taking Xarelto for previous pulmonary emboli. She also reports previous staph infections in the right breast  Review of Systems: A complete review of systems was obtained from the patient. I have reviewed this information and discussed as appropriate with the patient. See HPI as well for other ROS.  ROS   Medical History: Past Medical History:  Diagnosis Date   Arthritis   COPD (chronic obstructive pulmonary disease) (CMS-HCC)   Esophageal varices (CMS-HCC)   History of cancer   Patient Active Problem List  Diagnosis   Malignant neoplasm of upper-outer quadrant of right breast in female, estrogen receptor positive (CMS-HCC)   Past Surgical History:  Procedure Laterality Date   APPENDECTOMY N/A  Date Unknown   CHOLECYSTECTOMY N/A  Date Unknown    No Known Allergies  Current Outpatient Medications on File Prior to Visit  Medication Sig Dispense Refill   albuterol 90 mcg/actuation inhaler 1 puff as needed   atorvastatin (LIPITOR) 80 MG tablet 1  tablet (80 mg total)   dextromethorphan-guaifenesin (ROBITUSSIN-DM) 10-100 mg/5 mL liquid Take 5 mLs by mouth every 4 (four) hours as needed   diclofenac (VOLTAREN) 1 % topical gel Apply 2 g topically 4 (four) times daily   esomeprazole (NEXIUM) 40 MG DR capsule 1 capsule (40 mg total)   gabapentin (NEURONTIN) 400 MG capsule 1 capsule (400 mg total)   ipratropium-albuteroL (DUO-NEB) nebulizer solution Inhale into the lungs   isosorbide mononitrate (IMDUR) 30 MG ER tablet 1 tablet in the morning   metOLazone (ZAROXOLYN) 2.5 MG tablet 1 tablet (2.5 mg total)   metoprolol succinate (TOPROL-XL) 25 MG XL tablet Take 1 tablet (25 mg total) by mouth once daily   mirabegron (MYRBETRIQ) 50 mg ER tablet Take 1 tablet (50 mg total) by mouth once daily   oxyCODONE (ROXICODONE) 15 MG immediate release tablet 1 tablet (15 mg total)   potassium chloride (KLOR-CON) 20 MEQ ER tablet Take 1 tablet (20 mEq total) by mouth once daily 30 tablet 2   rivaroxaban (XARELTO) 20 mg tablet Take 1 tablet (20 mg total) by mouth every morning   TORsemide (DEMADEX) 20 MG tablet 2 tablets   aspirin 81 MG EC tablet Take 1 tablet (81 mg total) by mouth once daily   No current facility-administered medications on file prior to visit.   Family History  Problem Relation Age of Onset   Breast cancer Mother   Coronary Artery Disease (Blocked arteries around heart) Sister  Social History   Tobacco Use  Smoking Status Every Day   Packs/day: 1.00   Types: Cigarettes  Smokeless Tobacco Never    Social History   Socioeconomic History   Marital status: Unknown  Tobacco Use   Smoking status: Every Day  Packs/day: 1.00  Types: Cigarettes   Smokeless tobacco: Never  Vaping Use   Vaping Use: Unknown  Substance and Sexual Activity   Alcohol use: Not Currently   Drug use: Never   Objective:  There were no vitals filed for this visit.  There is no height or weight on file to calculate BMI.  Physical Exam Vitals  reviewed.  Constitutional:  General: She is not in acute distress. Appearance: Normal appearance.  HENT:  Head: Normocephalic and atraumatic.  Right Ear: External ear normal.  Left Ear: External ear normal.  Nose: Nose normal.  Mouth/Throat:  Mouth: Mucous membranes are moist.  Pharynx: Oropharynx is clear.  Eyes:  General: No scleral icterus. Extraocular Movements: Extraocular movements intact.  Conjunctiva/sclera: Conjunctivae normal.  Pupils: Pupils are equal, round, and reactive to light.  Cardiovascular:  Rate and Rhythm: Normal rate and regular rhythm.  Pulses: Normal pulses.  Heart sounds: Normal heart sounds.  Pulmonary:  Effort: Pulmonary effort is normal. No respiratory distress.  Breath sounds: Normal breath sounds.  Abdominal:  General: Bowel sounds are normal.  Palpations: Abdomen is soft.  Tenderness: There is no abdominal tenderness.  Musculoskeletal:  General: No swelling, tenderness or deformity. Normal range of motion.  Cervical back: Normal range of motion and neck supple.  Skin: General: Skin is warm and dry.  Coloration: Skin is not jaundiced.  Neurological:  General: No focal deficit present.  Mental Status: She is alert and oriented to person, place, and time.  Psychiatric:  Mood and Affect: Mood normal.  Behavior: Behavior normal.     Breast: There is a small to medium size palpable hematoma in the upper outer central right breast. There is no palpable mass in the left breast. There is no palpable axillary, supraclavicular, or cervical lymphadenopathy.  Labs, Imaging and Diagnostic Testing:  Assessment and Plan:   Diagnoses and all orders for this visit:  Malignant neoplasm of upper-outer quadrant of right breast in female, estrogen receptor positive (CMS-HCC) - Ambulatory Referral to Oncology-Medical - Ambulatory Referral to Physical Therapy - Ambulatory Referral to Radiation Oncology - [facility] Case Posting Request; Future    The patient  appears to have a 2-1/2 cm cancer in the upper outer central right breast with clinically negative nodes. Given her significant comorbidities involving both her heart and lungs I would recommend the simplest operation to get rid of the cancer which would be a radioactive seed localized lumpectomy. Given her age and significant comorbidities and the fact that her nodes are clinically negative she will not need a node evaluation. I have discussed with her in detail the risks and benefits of the operation as well as some of the technical aspects and she understands and wishes to proceed. She will need to be off the Xarelto prior to surgery. We will have to obtain pulmonary and cardiac clearance prior to scheduling from Dr. Elsworth Soho and Dr. Alvester Chou. She will meet with medical and radiation oncology to discuss adjuvant therapy.

## 2021-05-02 NOTE — Op Note (Signed)
05/02/2021  1:32 PM  PATIENT:  Sharon Vasquez  71 y.o. female  PRE-OPERATIVE DIAGNOSIS:  RIGHT BREAST CANCER  POST-OPERATIVE DIAGNOSIS:  RIGHT BREAST CANCER  PROCEDURE:  Procedure(s): RIGHT BREAST LUMPECTOMY WITH RADIOACTIVE SEED LOCALIZATION (Right)  SURGEON:  Surgeon(s) and Role:    * Jovita Kussmaul, MD - Primary  PHYSICIAN ASSISTANT:   ASSISTANTS: none   ANESTHESIA:   general  EBL:  10cc   BLOOD ADMINISTERED:none  DRAINS: none   LOCAL MEDICATIONS USED:  MARCAINE     SPECIMEN:  Source of Specimen:  right breast tissue  DISPOSITION OF SPECIMEN:  PATHOLOGY  COUNTS:  YES  TOURNIQUET:  * No tourniquets in log *  DICTATION: .Dragon Dictation  After informed consent was obtained the patient was brought to the operating room and placed in the supine position on the operating table.  After adequate induction of general anesthesia the patient's right breast was prepped with ChloraPrep, allowed to dry, and draped in usual sterile manner.  An appropriate timeout was performed.  Previously an I-125 seed was placed in the upper outer quadrant of the right breast to mark an area of invasive breast cancer.  The neoprobe was set to I-125 in the area of radioactivity was readily identified.  Overlying the area of radioactivity there was red firm area in the skin that I did not feel comfortable leaving behind.  I elected to make an elliptical incision in the skin overlying the area of radioactivity with a 15 blade knife.  The incision was carried through the skin and subcutaneous tissue sharply with the electrocautery.  Dissection was then carried widely around the radioactive seed sharply with the electrocautery while checking the area of radioactivity frequently.  Once the specimen was removed it was oriented with the appropriate paint colors.  A specimen radiograph was obtained that showed the seed and 2 clips near the center of the specimen.  The specimen was then sent to pathology for  further evaluation.  Hemostasis was achieved using the Bovie electrocautery.  The wound was irrigated with saline and infiltrated with more quarter percent Marcaine.  The deep layer of the wound was then closed with layers of interrupted 3-0 Vicryl stitches.  The skin was then closed with a running 4-0 Monocryl subcuticular stitch.  Dermabond dressings were applied.  The patient tolerated the procedure well.  At the end of the case all needle sponge and instrument counts were correct.  The patient was then awakened and taken to recovery in stable condition.  PLAN OF CARE: Discharge to home after PACU  PATIENT DISPOSITION:  PACU - hemodynamically stable.   Delay start of Pharmacological VTE agent (>24hrs) due to surgical blood loss or risk of bleeding: not applicable

## 2021-05-02 NOTE — Interval H&P Note (Signed)
History and Physical Interval Note:  05/02/2021 11:18 AM  [name]  has presented today for surgery, with the diagnosis of RIGHT BREAST CANCER.  The various methods of treatment have been discussed with the patient and family. After consideration of risks, benefits and other options for treatment, the patient has consented to  Procedure(s): RIGHT BREAST LUMPECTOMY WITH RADIOACTIVE SEED LOCALIZATION (Right) as a surgical intervention.  The patient's history has been reviewed, patient examined, no change in status, stable for surgery.  I have reviewed the patient's chart and labs.  Questions were answered to the patient's satisfaction.     Autumn Messing III

## 2021-05-02 NOTE — Transfer of Care (Signed)
Immediate Anesthesia Transfer of Care Note  Patient: Sharon Vasquez  Procedure(s) Performed: RIGHT BREAST LUMPECTOMY WITH RADIOACTIVE SEED LOCALIZATION (Right: Breast)  Patient Location: PACU  Anesthesia Type:General  Level of Consciousness: drowsy, patient cooperative and responds to stimulation  Airway & Oxygen Therapy: Patient Spontanous Breathing and Patient connected to face mask oxygen  Post-op Assessment: Report given to RN, Post -op Vital signs reviewed and stable and Patient moving all extremities X 4  Post vital signs: Reviewed and stable  Last Vitals:  Vitals Value Taken Time  BP 145/90 05/02/21 1345  Temp    Pulse 103 05/02/21 1345  Resp 24 05/02/21 1345  SpO2 100 % 05/02/21 1345  Vitals shown include unvalidated device data.  Last Pain:  Vitals:   05/02/21 1017  TempSrc: Oral         Complications: No notable events documented.

## 2021-05-03 ENCOUNTER — Encounter (HOSPITAL_COMMUNITY): Payer: Self-pay | Admitting: General Surgery

## 2021-05-04 ENCOUNTER — Other Ambulatory Visit: Payer: Self-pay | Admitting: Internal Medicine

## 2021-05-04 DIAGNOSIS — J9611 Chronic respiratory failure with hypoxia: Secondary | ICD-10-CM | POA: Diagnosis not present

## 2021-05-04 DIAGNOSIS — I509 Heart failure, unspecified: Secondary | ICD-10-CM | POA: Diagnosis not present

## 2021-05-04 DIAGNOSIS — M170 Bilateral primary osteoarthritis of knee: Secondary | ICD-10-CM | POA: Diagnosis not present

## 2021-05-04 DIAGNOSIS — G4733 Obstructive sleep apnea (adult) (pediatric): Secondary | ICD-10-CM | POA: Diagnosis not present

## 2021-05-04 LAB — SURGICAL PATHOLOGY

## 2021-05-09 DIAGNOSIS — M170 Bilateral primary osteoarthritis of knee: Secondary | ICD-10-CM | POA: Diagnosis not present

## 2021-05-09 DIAGNOSIS — I509 Heart failure, unspecified: Secondary | ICD-10-CM | POA: Diagnosis not present

## 2021-05-09 DIAGNOSIS — J439 Emphysema, unspecified: Secondary | ICD-10-CM | POA: Diagnosis not present

## 2021-05-09 DIAGNOSIS — R030 Elevated blood-pressure reading, without diagnosis of hypertension: Secondary | ICD-10-CM | POA: Diagnosis not present

## 2021-05-09 DIAGNOSIS — Z79899 Other long term (current) drug therapy: Secondary | ICD-10-CM | POA: Diagnosis not present

## 2021-05-09 NOTE — Progress Notes (Signed)
Patient Care Team: [name], DO as PCP - General Gwenlyn Found Pearletha Forge, MD as PCP - Cardiology (Cardiology) Jesus Genera, RN as Registered Nurse Drema Pry as Social Worker Jovita Kussmaul, MD as Consulting Physician (General Surgery) Nicholas Lose, MD as Consulting Physician (Hematology and Oncology) Kyung Rudd, MD as Consulting Physician (Radiation Oncology) Rockwell Germany, RN as Oncology Nurse Navigator Mauro Kaufmann, RN as Oncology Nurse Navigator  DIAGNOSIS:    ICD-10-CM   1. Malignant neoplasm of upper-outer quadrant of right breast in female, estrogen receptor positive ([place])  C50.411    Z17.0       SUMMARY OF ONCOLOGIC HISTORY: Oncology History  Malignant neoplasm of upper-outer quadrant of right breast in female, estrogen receptor positive ([place])  03/11/2021 Initial Diagnosis   Bilateral breast pain for 3-4 months. Diagnostic mammogram: irregular mass in the anterior aspect of the right breast laterally at 10:00 1.7 cm.  Ultrasound: 2 masses 1.7 cm and 0.9 cm biopsy: Grade 2 invasive ductal carcinoma and DCIS, ER/PR+(100%)/Her2-.  Ki-67 5%   03/23/2021 Cancer Staging   Staging form: Breast, AJCC 8th Edition - Clinical stage from 03/23/2021: Stage IA (cT1c, cN0, cM0, G2, ER+, PR+, HER2-) - Signed by Nicholas Lose, MD on 03/23/2021 Stage prefix: Initial diagnosis Histologic grading system: 3 grade system    05/02/2021 Surgery   Right lumpectomy: Grade 2 IDC 1.4 cm with low-grade to intermediate grade DCIS, margins negative, ER 100%, PR 100%, HER2 negative, Ki-67 5%     CHIEF COMPLIANT: Follow-up of right breast cancer  INTERVAL HISTORY: Sharon Vasquez is a 71 y.o. with above-mentioned history of right breast cancer. Right lumpectomy on 05/02/2021 showed on grade 2 invasive ductal carcinoma and low to intermediate grade DCIS. She presents to the clinic today for follow-up.  She has tolerated surgery reasonably well.  She is here to discuss pathology  report.  Denies any pain or discomfort in the breast.  She has multiple chronic medical illnesses and is morbidly obese and is trying to lose weight.  ALLERGIES:  has No Known Allergies.  MEDICATIONS:  Current Outpatient Medications  Medication Sig Dispense Refill   albuterol (VENTOLIN HFA) 108 (90 Base) MCG/ACT inhaler Inhale 2 puffs into the lungs every 4 (four) hours as needed for shortness of breath.     aspirin 81 MG chewable tablet Chew 81 mg by mouth daily.     atorvastatin (LIPITOR) 80 MG tablet TAKE 1 TABLET(80 MG) BY MOUTH DAILY 90 tablet 0   azithromycin (ZITHROMAX) 250 MG tablet Take 1 tablet (250 mg total) by mouth daily. (Patient not taking: Reported on 04/21/2021) 6 tablet 0   diclofenac Sodium (VOLTAREN) 1 % GEL Apply 2 g topically 4 (four) times daily. (Patient not taking: Reported on 04/21/2021) 100 g 1   esomeprazole (NEXIUM) 40 MG capsule Take 1 capsule (40 mg total) by mouth daily as needed (for heartburn or indigestion). (Patient taking differently: Take 40 mg by mouth daily.) 90 capsule 1   Fluticasone-Umeclidin-Vilant (TRELEGY ELLIPTA) 100-62.5-25 MCG/ACT AEPB Inhale 1 puff into the lungs daily. 60 each 5   gabapentin (NEURONTIN) 400 MG capsule TAKE 1 CAPSULE BY MOUTH  TWICE DAILY 180 capsule 3   guaiFENesin-dextromethorphan (ROBITUSSIN DM) 100-10 MG/5ML syrup Take 5 mLs by mouth every 4 (four) hours as needed for cough. 236 mL 0   ibuprofen (ADVIL) 200 MG tablet Take 800 mg by mouth every 6 (six) hours as needed for moderate pain.     ipratropium-albuterol (  DUONEB) 0.5-2.5 (3) MG/3ML SOLN Take 3 mLs by nebulization every 6 (six) hours as needed. 360 mL 3   isosorbide mononitrate (IMDUR) 30 MG 24 hr tablet TAKE 1 TABLET(30 MG) BY MOUTH DAILY 90 tablet 1   letrozole (FEMARA) 2.5 MG tablet Take 1 tablet (2.5 mg total) by mouth daily. 30 tablet 6   metolazone (ZAROXOLYN) 2.5 MG tablet Take 1 tablet (2.5 mg total) by mouth every other day. (Patient taking differently: Take 2.5  mg by mouth daily as needed (extra fluid not covered relieved by torsemide).) 30 tablet 1   metoprolol succinate (TOPROL XL) 25 MG 24 hr tablet Take 1 tablet (25 mg total) by mouth daily. 90 tablet 0   mirabegron ER (MYRBETRIQ) 50 MG TB24 tablet Take 1 tablet (50 mg total) by mouth daily. 90 tablet 1   nicotine (NICODERM CQ - DOSED IN MG/24 HOURS) 21 mg/24hr patch Place 21 mg onto the skin daily.     oxyCODONE (ROXICODONE) 15 MG immediate release tablet Take 15 mg by mouth every 6 (six) hours as needed for pain.     oxyCODONE (ROXICODONE) 5 MG immediate release tablet Take 1 tablet (5 mg total) by mouth every 6 (six) hours as needed for severe pain. 15 tablet 0   OXYGEN Inhale 2-3 L into the lungs daily as needed (shortness of breath).     oxymetazoline (AFRIN) 0.05 % nasal spray Place 1 spray into both nostrils 2 (two) times daily as needed for congestion.     potassium chloride SA (KLOR-CON M) 20 MEQ tablet Take 1 tablet (20 mEq total) by mouth daily. (Patient taking differently: Take 20 mEq by mouth 2 (two) times daily.) 30 tablet 1   PRESCRIPTION MEDICATION Inhale into the lungs at bedtime. cpap     torsemide (DEMADEX) 20 MG tablet Take 2 tablets by mouth daily, you may take an extra 1/2 tablet only as needed for weight gain of 2 lbs overnight or 5 lbs in a week (Patient taking differently: Take 20 mg by mouth 2 (two) times daily.) 225 tablet 1   XARELTO 20 MG TABS tablet TAKE 1 TABLET(20 MG) BY MOUTH EVERY MORNING 90 tablet 3   No current facility-administered medications for this visit.    PHYSICAL EXAMINATION: ECOG PERFORMANCE STATUS: 1 - Symptomatic but completely ambulatory  Vitals:   05/10/21 1129  BP: 122/79  Pulse: 86  Temp: 97.6 F (36.4 C)  SpO2: 97%   There were no vitals filed for this visit.    LABORATORY DATA:  I have reviewed the data as listed CMP Latest Ref Rng & Units 04/27/2021 04/25/2021 03/23/2021  Glucose 70 - 99 mg/dL 166(H) - 142(H)  BUN 8 - 23 mg/dL 12 -  13  Creatinine 0.44 - 1.00 mg/dL 1.04(H) - 0.92  Sodium 135 - 145 mmol/L 138 - 142  Potassium 3.5 - 5.1 mmol/L 3.3(L) 4.0 2.9(L)  Chloride 98 - 111 mmol/L 105 - 104  CO2 22 - 32 mmol/L 23 - 29  Calcium 8.9 - 10.3 mg/dL 9.1 - 8.8(L)  Total Protein 6.5 - 8.1 g/dL 7.1 - 7.0  Total Bilirubin 0.3 - 1.2 mg/dL 0.7 - 0.6  Alkaline Phos 38 - 126 U/L 66 - 75  AST 15 - 41 U/L 21 - 17  ALT 0 - 44 U/L 18 - 15    Lab Results  Component Value Date   WBC 4.9 04/27/2021   HGB 13.2 04/27/2021   HCT 38.4 04/27/2021   MCV 85.5 04/27/2021  PLT 381 04/27/2021   NEUTROABS 4.3 03/23/2021    ASSESSMENT & PLAN:  Malignant neoplasm of upper-outer quadrant of right breast in female, estrogen receptor positive ([place]) 03/11/2021:Bilateral breast pain for 3-4 months. Diagnostic mammogram: irregular mass in the anterior aspect of the right breast laterally at 10:00 1.7 cm.  Ultrasound: 2 masses 1.7 cm and 0.9 cm biopsy: Grade 2 invasive ductal carcinoma and DCIS, ER/PR+(100%)/Her2-.  Ki-67 5%  05/02/2020:Right lumpectomy: Grade 2 IDC 1.4 cm with low-grade to intermediate grade DCIS, margins negative, ER 100%, PR 100%, HER2 negative, Ki-67 5%  Pathology counseling: I discussed the final pathology report of the patient provided  a copy of this report. I discussed the margins as well as lymph node surgeries. We also discussed the final staging along with previously performed ER/PR and HER-2/neu testing.  Treatment plan: 1. patient is not a candidate for chemotherapy given her multiple health issues and performance status. 2. Adjuvant radiation therapy: We will discussed the pros and cons of radiation.  She is significantly obese and may have a hard time getting on the table.  She also has lung issues which might preclude radiation. 3. Adjuvant antiestrogen therapy with letrozole started prior to surgery and she appears to be tolerating it well.  Obesity: She will discuss with her primary care clinic about using  weight loss medications.  I strongly suggest that she try 1 of these medications to help lose weight.  Return to clinic in 6 months for follow-up with Mendel Ryder.    No orders of the defined types were placed in this encounter.  The patient has a good understanding of the overall plan. she agrees with it. she will call with any problems that may develop before the next visit here.  Total time spent: 30 mins including face to face time and time spent for planning, charting and coordination of care  Rulon Eisenmenger, MD, MPH 05/10/2021  I, Thana Ates, am acting as scribe for Dr. Nicholas Lose.  I have reviewed the above documentation for accuracy and completeness, and I agree with the above.

## 2021-05-10 ENCOUNTER — Inpatient Hospital Stay: Payer: Medicare Other | Attending: Hematology and Oncology | Admitting: Hematology and Oncology

## 2021-05-10 ENCOUNTER — Other Ambulatory Visit: Payer: Self-pay

## 2021-05-10 DIAGNOSIS — C50411 Malignant neoplasm of upper-outer quadrant of right female breast: Secondary | ICD-10-CM | POA: Diagnosis not present

## 2021-05-10 DIAGNOSIS — Z170 Estrogen receptor positive status [ER+]: Secondary | ICD-10-CM | POA: Diagnosis not present

## 2021-05-10 NOTE — Assessment & Plan Note (Signed)
03/11/2021:Bilateral breast pain for 3-4 months. Diagnostic mammogram: irregular mass in the anterior aspect of the right breast laterally at 10:00 1.7 cm.  Ultrasound: 2 masses 1.7 cm and 0.9 cm biopsy: Grade 2 invasive ductal carcinoma and DCIS, ER/PR+(100%)/Her2-.  Ki-67 5%  05/02/2020:Right lumpectomy: Grade 2 IDC 1.4 cm with low-grade to intermediate grade DCIS, margins negative, ER 100%, PR 100%, HER2 negative, Ki-67 5%  Pathology counseling: I discussed the final pathology report of the patient provided  a copy of this report. I discussed the margins as well as lymph node surgeries. We also discussed the final staging along with previously performed ER/PR and HER-2/neu testing.  Treatment plan: 1. patient is not a candidate for chemotherapy given her multiple health issues and performance status. 2. Adjuvant radiation therapy followed by 3. Adjuvant antiestrogen therapy  Return to clinic after radiation is complete to start antiestrogen therapy

## 2021-05-11 ENCOUNTER — Encounter: Payer: Self-pay | Admitting: *Deleted

## 2021-05-11 DIAGNOSIS — C50411 Malignant neoplasm of upper-outer quadrant of right female breast: Secondary | ICD-10-CM

## 2021-05-11 DIAGNOSIS — Z170 Estrogen receptor positive status [ER+]: Secondary | ICD-10-CM

## 2021-05-12 ENCOUNTER — Ambulatory Visit (INDEPENDENT_AMBULATORY_CARE_PROVIDER_SITE_OTHER)
Admission: RE | Admit: 2021-05-12 | Discharge: 2021-05-12 | Disposition: A | Discharge status: Home / Self Care | Payer: Medicare Other | Source: Ambulatory Visit | Attending: Pulmonary Disease | Admitting: Pulmonary Disease

## 2021-05-12 ENCOUNTER — Other Ambulatory Visit: Payer: Self-pay

## 2021-05-12 DIAGNOSIS — Z853 Personal history of malignant neoplasm of breast: Secondary | ICD-10-CM | POA: Diagnosis not present

## 2021-05-12 DIAGNOSIS — R0602 Shortness of breath: Secondary | ICD-10-CM

## 2021-05-12 DIAGNOSIS — J479 Bronchiectasis, uncomplicated: Secondary | ICD-10-CM | POA: Diagnosis not present

## 2021-05-12 DIAGNOSIS — J9811 Atelectasis: Secondary | ICD-10-CM | POA: Diagnosis not present

## 2021-05-12 DIAGNOSIS — I2510 Atherosclerotic heart disease of native coronary artery without angina pectoris: Secondary | ICD-10-CM | POA: Diagnosis not present

## 2021-05-13 ENCOUNTER — Telehealth: Payer: Self-pay | Admitting: Pulmonary Disease

## 2021-05-13 NOTE — Telephone Encounter (Signed)
[facility] has access to our office notes. Will notify Melissa.   Will close encounter.

## 2021-05-17 ENCOUNTER — Ambulatory Visit: Payer: Medicare Other

## 2021-05-17 NOTE — Chronic Care Management (AMB) (Signed)
Care Management    RN Visit Note  05/17/2021 Name: Sharon Vasquez [name]: [id number] DOB: 11-07-1950  Subjective: Sharon Vasquez is a 71 y.o. year old female who is a primary care patient of Atway, Sharon N, DO. The care management team was consulted for assistance with disease management and care coordination needs.    Engaged with patient by telephone for follow up visit in response to provider referral for case management and/or care coordination services.   Consent to Services:   Sharon Vasquez was given information about Care Management services today including:  Care Management services includes personalized support from designated clinical staff supervised by her physician, including individualized plan of care and coordination with other care providers 24/7 contact phone numbers for assistance for urgent and routine care needs. The patient may stop case management services at any time by phone call to the office staff.  Patient agreed to services and consent obtained.   Assessment: Review of patient past medical history, allergies, medications, health status, including review of consultants reports, laboratory and other test data, was performed as part of comprehensive evaluation and provision of chronic care management services.   SDOH (Social Determinants of Health) assessments and interventions performed:    Care Plan  No Known Allergies  Outpatient Encounter Medications as of 05/17/2021  Medication Sig Note   albuterol (VENTOLIN HFA) 108 (90 Base) MCG/ACT inhaler Inhale 2 puffs into the lungs every 4 (four) hours as needed for shortness of breath.    aspirin 81 MG chewable tablet Chew 81 mg by mouth daily. 03/16/2021: Med Classification: Hematological Agents   atorvastatin (LIPITOR) 80 MG tablet TAKE 1 TABLET(80 MG) BY MOUTH DAILY    diclofenac Sodium (VOLTAREN) 1 % GEL Apply 2 g topically 4 (four) times daily. (Patient not taking: Reported on 04/21/2021)    esomeprazole (NEXIUM)  40 MG capsule Take 1 capsule (40 mg total) by mouth daily as needed (for heartburn or indigestion). (Patient taking differently: Take 40 mg by mouth daily.)    Fluticasone-Umeclidin-Vilant (TRELEGY ELLIPTA) 100-62.5-25 MCG/ACT AEPB Inhale 1 puff into the lungs daily.    gabapentin (NEURONTIN) 400 MG capsule TAKE 1 CAPSULE BY MOUTH  TWICE DAILY    guaiFENesin-dextromethorphan (ROBITUSSIN DM) 100-10 MG/5ML syrup Take 5 mLs by mouth every 4 (four) hours as needed for cough.    ibuprofen (ADVIL) 200 MG tablet Take 800 mg by mouth every 6 (six) hours as needed for moderate pain.    ipratropium-albuterol (DUONEB) 0.5-2.5 (3) MG/3ML SOLN Take 3 mLs by nebulization every 6 (six) hours as needed.    isosorbide mononitrate (IMDUR) 30 MG 24 hr tablet TAKE 1 TABLET(30 MG) BY MOUTH DAILY    letrozole (FEMARA) 2.5 MG tablet Take 1 tablet (2.5 mg total) by mouth daily.    metolazone (ZAROXOLYN) 2.5 MG tablet Take 1 tablet (2.5 mg total) by mouth every other day. (Patient taking differently: Take 2.5 mg by mouth daily as needed (extra fluid not covered relieved by torsemide).)    metoprolol succinate (TOPROL XL) 25 MG 24 hr tablet Take 1 tablet (25 mg total) by mouth daily.    mirabegron ER (MYRBETRIQ) 50 MG TB24 tablet Take 1 tablet (50 mg total) by mouth daily.    nicotine (NICODERM CQ - DOSED IN MG/24 HOURS) 21 mg/24hr patch Place 21 mg onto the skin daily.    oxyCODONE (ROXICODONE) 15 MG immediate release tablet Take 15 mg by mouth every 6 (six) hours as needed for pain.  oxyCODONE (ROXICODONE) 5 MG immediate release tablet Take 1 tablet (5 mg total) by mouth every 6 (six) hours as needed for severe pain.    OXYGEN Inhale 2-3 L into the lungs daily as needed (shortness of breath).    oxymetazoline (AFRIN) 0.05 % nasal spray Place 1 spray into both nostrils 2 (two) times daily as needed for congestion.    potassium chloride SA (KLOR-CON M) 20 MEQ tablet Take 1 tablet (20 mEq total) by mouth daily. (Patient  taking differently: Take 20 mEq by mouth 2 (two) times daily.)    PRESCRIPTION MEDICATION Inhale into the lungs at bedtime. cpap    torsemide (DEMADEX) 20 MG tablet Take 2 tablets by mouth daily, you may take an extra 1/2 tablet only as needed for weight gain of 2 lbs overnight or 5 lbs in a week (Patient taking differently: Take 20 mg by mouth 2 (two) times daily.)    XARELTO 20 MG TABS tablet TAKE 1 TABLET(20 MG) BY MOUTH EVERY MORNING    No facility-administered encounter medications on file as of 05/17/2021.    Patient Active Problem List   Diagnosis Date Noted   New onset type 2 diabetes mellitus ([place]) 04/09/2021   Hypokalemia 04/09/2021   Genetic testing 03/23/2021   Malignant neoplasm of upper-outer quadrant of right breast in female, estrogen receptor positive ([place]) 03/21/2021   Pre-operative respiratory examination 03/16/2021   Right foot pain 03/03/2021   Breast pain, right 01/17/2021   Chronic heart failure with preserved ejection fraction (HFpEF) (HCC)    Paroxysmal atrial fibrillation (HCC)    Osteoarthritis of left hip 10/07/2020   Venous stasis ulcer of left lower extremity ([place]) 08/18/2020   Hyperlipidemia 05/14/2020   Esophageal dysmotility 11/10/2019   Chronic respiratory failure with hypoxia ([place]) 09/30/2019   Mixed incontinence, urge and stress (female) (female) 08/21/2019   Preventative health care 06/04/2019   GERD (gastroesophageal reflux disease) 01/20/2019   ILD (interstitial lung disease) ([place]) 02/13/2018   OSA (obstructive sleep apnea) 11/14/2017   Chronic pain syndrome 07/27/2017   Major depression, recurrent, chronic ([place]) 02/01/2017   Osteoporosis 12/14/2016   Drug induced constipation 08/21/2016   Aortic atherosclerosis ([place]) 07/17/2016   Chronic venous insufficiency 07/12/2016   Chronic anticoagulation 07/12/2016   Tobacco use 07/11/2016   Acute exacerbation of chronic obstructive pulmonary disease (COPD) (HCC)    Diastolic heart failure ([place])     History of pulmonary embolism    Atrial fibrillation with controlled ventricular response (HCC)    Vocal cord polyp 12/18/2015   Laryngopharyngeal reflux 12/18/2015   Morbid obesity ([place])    Peripheral vascular disease ([place])    Benign essential HTN    Primary osteoarthritis of both knees 09/16/2014    Conditions to be addressed/monitored: CHF, HTN, and COPD  Care Plan : CCM RN- COPD (Adult)  Updates made by Johnney Killian, RN since 05/17/2021 12:00 AM     Problem: Symptom Exacerbation (COPD)      Goal: Symptom Exacerbation Prevented or Minimized   Start Date: 06/23/2020  Recent Progress: On track  Priority: High  Note:    Current Barriers:  CHF, HTN, and COPD- spoke with patient via phone to complete follow up assessment.  Patient notes she has not had any changes to her breathing over the past month, we discussed her next appointment with Dr. Elsworth Soho on 07/07/2021.  Patient shared that she had her lumpectomy with the outcome of Grade 2 IDC 1.4 cm with low-grade to intermediate grade DCIS, margins negative,  ER 100%, PR 100%, HER2 negative, Ki-67 5% and she said she had absolutely no pain after the procedure.  Patient is not having any treatment and will follow up with oncology in 6 months.  Reviewed patient medications and she is taking as ordered, she does not need any refills.  Patient agreed to call this RNCM if she has any questions/concerns and will follow up in 6-8 weeks. Knowledge Deficits related to plan of care for management of CHF, HTN, and COPD  Chronic Disease Management support and education needs related to CHF, HTN, and COPD  Lacks caregiver support Financial Constraints  RNCM Clinical Goal(s):  Patient will verbalize understanding of plan for management of CHF, HTN, and COPD as evidenced by continued discussions with provider and RNCM. demonstrate Ongoing adherence to prescribed treatment plan for CHF, HTN, and COPD as evidenced by weight and hypertension  monitoring. continue to work with RN Care Manager to address care management and care coordination needs related to  CHF, HTN, and COPD as evidenced by adherence to CM Team Scheduled appointments not experience hospital admission as evidenced by review of EMR. Hospital Admissions in last 6 months = 0  through collaboration with RN Care manager, provider, and care team.  Interventions: 1:1 collaboration with primary care provider regarding development and update of comprehensive plan of care as evidenced by provider attestation and co-signature Inter-disciplinary care team collaboration (see longitudinal plan of care) Evaluation of current treatment plan related to  self management and patient's adherence to plan as established by provider Heart Failure Interventions:  (Status:  Goal on track:  NO.) Long Term Goal Basic overview and discussion of pathophysiology of Heart Failure reviewed Discussed importance of daily weight and advised patient to weigh and record daily Discussed the importance of keeping all appointments with provider COPD Interventions:  (Status:  Goal on track:  NO.) Long Term Goal Advised patient to track and manage COPD triggers Provided instruction about proper use of medications used for management of COPD including inhalers Discussed the importance of adequate rest and management of fatigue with COPD Hypertension Interventions:  (Status:  Goal on track:  Yes.) Long Term Goal Last practice recorded BP readings:  BP Readings from Last 3 Encounters:  05/10/21 122/79  05/02/21 126/76  04/27/21 (!) 162/87  Most recent eGFR/CrCl:  Lab Results  Component Value Date   EGFR 76 03/09/2021    No components found for: CRCL  Evaluation of current treatment plan related to hypertension self management and patient's adherence to plan as established by provider Reviewed medications with patient and discussed importance of compliance Discussed plans with patient for ongoing care  management follow up and provided patient with direct contact information for care management team Advised patient, providing education and rationale, to monitor blood pressure daily and record, calling PCP for findings outside established parameters Reviewed scheduled/upcoming provider appointments including:  Patient Goals/Self-Care Activities: Take all medications as prescribed Attend all scheduled provider appointments Call pharmacy for medication refills 3-7 days in advance of running out of medications Perform all self care activities independently  Perform IADL's (shopping, preparing meals, housekeeping, managing finances) independently Call provider office for new concerns or questions  call office if I gain more than 2 pounds in one day or 5 pounds in one week track weight in diary watch for swelling in feet, ankles and legs every day weigh myself daily begin a heart failure diary bring diary to all appointments develop a rescue plan identify and avoid work-related triggers begin a  symptom diary follow rescue plan if symptoms flare-up keep follow-up appointments:   check blood pressure 3 times per week choose a place to take my blood pressure (home, clinic or office, retail store) write blood pressure results in a log or diary keep a blood pressure log call doctor for signs and symptoms of high blood pressure keep all doctor appointments take medications for blood pressure exactly as prescribed  Follow Up Plan:  The patient has been provided with contact information for the care management team and has been advised to call with any health related questions or concerns.   Current Barriers:  Chronic Disease Management support, education, and care coordination needs related to    Clinical Goal(s) related to Atrial Fibrillation, CHF, HTN, and COPD:  Over the next 30 days, patient will:  Work with the care management team to address educational, disease management, and care  coordination needs  Begin or continue self health monitoring activities as directed today Measure and record blood pressure 5-7 times per week and assess swelling in lower extremities daily, check O2 sats when short of breath  Call provider office for new or worsened signs and symptoms Blood pressure findings outside established parameters, breathing outside of baseline,  Shortness of breath, and New or worsened symptom related to CHF and COPD Call care management team with questions or concerns Verbalize basic understanding of patient centered plan of care established today  Interventions related to Atrial Fibrillation, CHF, HTN, and COPD:  Appropriate assessments completed Reviewed medications with patient and assessed medication taking behavior;  if indicated, discussed importance of medication adherence  Discussed plans with patient for ongoing care management follow up and ensured patient has contact number for CCM team and clinic  Patient Self Care Activities related to Atrial Fibrillation, CHF, HTN, and COPD:  Patient is unable to independently self-manage chronic health conditions       Plan: The patient has been provided with contact information for the care management team and has been advised to call with any health related questions or concerns.   Johnney Killian, RN, BSN, CCM Care Management Coordinator [facility] Phone: [phone]: [phone]

## 2021-05-17 NOTE — Patient Instructions (Signed)
Visit Information  Thank you for taking time to visit with me today. Please don't hesitate to contact me if I can be of assistance to you before our next scheduled telephone appointment.  Our next appointment is by telephone on 07/12/21 at 1:30.  Please call the care guide team at [phone] if you need to cancel or reschedule your appointment.   If you are experiencing a Mental Health or [place] or need someone to talk to, please call the [place] National Suicide Prevention Lifeline: 651 515 5141 or TTY: (612) 611-1944 TTY (505)778-8455) to talk to a trained counselor   Patient verbalizes understanding of instructions and care plan provided today and agrees to view in [place]. Active MyChart status confirmed with patient.    The patient has been provided with contact information for the care management team and has been advised to call with any health related questions or concerns.   Johnney Killian, RN, BSN, CCM Care Management Coordinator [facility] Phone: [phone]: [phone]

## 2021-05-20 DIAGNOSIS — J449 Chronic obstructive pulmonary disease, unspecified: Secondary | ICD-10-CM | POA: Diagnosis not present

## 2021-05-24 ENCOUNTER — Telehealth: Payer: Self-pay | Admitting: Pulmonary Disease

## 2021-05-24 NOTE — Telephone Encounter (Signed)
Changes of ILD/ fibrosis as before , slight worse She really has to QUIT smoking as we have discussed Other possibility is hypersensitivity pneumonitis , we can discuss more on FU visit , pls schedule for march   Patient is aware of results and voiced her understanding.  Appt scheduled 06/21/2021 at 11:30. Nothing further needed.

## 2021-06-01 DIAGNOSIS — G4733 Obstructive sleep apnea (adult) (pediatric): Secondary | ICD-10-CM | POA: Diagnosis not present

## 2021-06-01 DIAGNOSIS — J9611 Chronic respiratory failure with hypoxia: Secondary | ICD-10-CM | POA: Diagnosis not present

## 2021-06-01 DIAGNOSIS — M170 Bilateral primary osteoarthritis of knee: Secondary | ICD-10-CM | POA: Diagnosis not present

## 2021-06-01 DIAGNOSIS — I509 Heart failure, unspecified: Secondary | ICD-10-CM | POA: Diagnosis not present

## 2021-06-06 ENCOUNTER — Other Ambulatory Visit: Payer: Self-pay

## 2021-06-06 DIAGNOSIS — R030 Elevated blood-pressure reading, without diagnosis of hypertension: Secondary | ICD-10-CM | POA: Diagnosis not present

## 2021-06-06 DIAGNOSIS — M170 Bilateral primary osteoarthritis of knee: Secondary | ICD-10-CM | POA: Diagnosis not present

## 2021-06-06 DIAGNOSIS — J439 Emphysema, unspecified: Secondary | ICD-10-CM | POA: Diagnosis not present

## 2021-06-06 DIAGNOSIS — Z79899 Other long term (current) drug therapy: Secondary | ICD-10-CM | POA: Diagnosis not present

## 2021-06-06 DIAGNOSIS — I509 Heart failure, unspecified: Secondary | ICD-10-CM | POA: Diagnosis not present

## 2021-06-06 MED ORDER — ATORVASTATIN CALCIUM 80 MG PO TABS: ORAL_TABLET | ORAL | 0 refills | Status: DC

## 2021-06-08 ENCOUNTER — Telehealth: Payer: Self-pay

## 2021-06-08 NOTE — Telephone Encounter (Signed)
The pharmacy is waiting for the office to reply back on atorvastatin (LIPITOR) 80 MG tablet. Please call pt back.  ?

## 2021-06-08 NOTE — Telephone Encounter (Incomplete Revision)
Prescription was recalled to [facility].  Patient was called and notified of.

## 2021-06-08 NOTE — Telephone Encounter (Addendum)
Prescription was recalled to [facility].  Patient was called and notified of. ?

## 2021-06-09 ENCOUNTER — Encounter (HOSPITAL_COMMUNITY): Payer: Self-pay

## 2021-06-13 DIAGNOSIS — Z79899 Other long term (current) drug therapy: Secondary | ICD-10-CM | POA: Diagnosis not present

## 2021-06-17 DIAGNOSIS — J449 Chronic obstructive pulmonary disease, unspecified: Secondary | ICD-10-CM | POA: Diagnosis not present

## 2021-06-21 ENCOUNTER — Other Ambulatory Visit: Payer: Self-pay

## 2021-06-21 ENCOUNTER — Encounter: Payer: Self-pay | Admitting: Pulmonary Disease

## 2021-06-21 ENCOUNTER — Ambulatory Visit (INDEPENDENT_AMBULATORY_CARE_PROVIDER_SITE_OTHER): Payer: Medicare Other | Admitting: Pulmonary Disease

## 2021-06-21 ENCOUNTER — Ambulatory Visit: Payer: Medicare Other | Admitting: Cardiovascular Disease

## 2021-06-21 VITALS — BP 138/82 | HR 82 | Temp 98.4°F | Ht 74.0 in | Wt 362.0 lb

## 2021-06-21 DIAGNOSIS — Z720 Tobacco use: Secondary | ICD-10-CM

## 2021-06-21 DIAGNOSIS — G4733 Obstructive sleep apnea (adult) (pediatric): Secondary | ICD-10-CM | POA: Diagnosis not present

## 2021-06-21 DIAGNOSIS — I5032 Chronic diastolic (congestive) heart failure: Secondary | ICD-10-CM | POA: Diagnosis not present

## 2021-06-21 DIAGNOSIS — J9611 Chronic respiratory failure with hypoxia: Secondary | ICD-10-CM | POA: Diagnosis not present

## 2021-06-21 DIAGNOSIS — J849 Interstitial pulmonary disease, unspecified: Secondary | ICD-10-CM

## 2021-06-21 MED ORDER — PREDNISONE 10 MG PO TABS: ORAL_TABLET | ORAL | 0 refills | Status: AC

## 2021-06-21 MED ORDER — NICOTINE 10 MG IN INHA: 1.0000 | RESPIRATORY_TRACT | 0 refills | Status: DC | PRN

## 2021-06-21 NOTE — Assessment & Plan Note (Signed)
>>  ASSESSMENT AND PLAN FOR DIASTOLIC HEART FAILURE (HCC) WRITTEN ON 06/21/2021  1:13 PM BY ALVA, RAKESH V, MD  Emphasize use of diuretics and weight monitoring

## 2021-06-21 NOTE — Assessment & Plan Note (Signed)
Continue oxygen 24/7. 

## 2021-06-21 NOTE — Assessment & Plan Note (Signed)
Emphasize use of diuretics and weight monitoring ?

## 2021-06-21 NOTE — Progress Notes (Signed)
? ?  Subjective:  ? ? Patient ID: Sharon Vasquez, female    DOB: May 02, 1950, 71 y.o.   [name]: [id number] ? ?HPI ? ?71 yo morbidly obese smoker for follow-up of ILD and OSA ?She smoked up to 2 packs/day,  now decreased to about 4-5 cigarettes daily ?  ?PMH - chronic diastolic heart failure.   ?-pulmonary embolism/ LLE DVT 2015  on Xarelto  s/p venoplasty and stenting of left common iliac and external iliac vein ?Duplex 05/2017 did not show any residual stenosis. ?-on Xarelto for chronic atrial fibrillation , amiodarone stopped 10/2019 ? ?Chief Complaint  ?Patient presents with  ? Follow-up  ?  Follow up to talk about CT results. Pt states she wants to discuss prednisone because she is having some SOB   ? ?Reports increased shortness of breath over the past few weeks.  Previously when she took prednisone does remarkably help with her breathing. ?She continues to smoke a few cigarettes daily.  This is in spite of wearing a nicotine patch and using nicotine gum. ?She arrives in a wheelchair and on oxygen. ? ?We reviewed high-resolution CT chest and previous PFTs which are mainly shown restriction ?She continues to be poorly compliant with diuretic, she does have a PureWick system which she uses occasionally ? ?She denies any problems with CPAP mask or pressure, she is very compliant ? ?Significant tests/ events reviewed ? ?HRCT 05/2021  " alternative " mild progression compared to the prior study and extensive air trapping, this is favored to reflect developing chronic hypersensitivity pneumonitis ? ?HRCT 05/2020 >> probable RB ILD, no worsening, enlarged pulmonary artery 4.4 cm, 4.4 cm ascending aortic aneurysm ?  ?HRCT 10/2019 >> Mild patchy ground-glass opacity ?and minimal subpleural reticulation without bronchiectasis or ?honeycombing. Extensive air trapping >> RBILD, enlarged PA ?  ?HRCT 12/2017 >> Mild interlobular septal thickening and mild patchy peribronchovascular ground-glass opacity in both lungs. Minimal patchy  subpleural reticulation in the upper lobes ? Pul edema vs RB-ILD ?  ?PFTs 03/2021 moderate restriction, ratio 89, FEV1 74%, FVC 65%, TLC 54%, DLCO 15.3/62% ?  ?PFTs 12/2017 - No obstruction, mild-mod restriction, decreased DLCO  ?FVC 2.16 (66%), FEV1 1.93 (75%), ratio 89, DLCOcor 16.45 (48%) ?  ?FENO 12/2017 5 ?  ?Echo 10/2020 moderate LVH, RVSP 37  ?ECHO 07/2016 - EF 60-65%, PA peak pressure 83m Hg ?  ?HST 12/2017 >> AHI 20/h ?  ?Esophagram 01/2020 >> Smooth strictured narrowing involving the distal esophagus near the GE junction ? ?Review of Systems ?neg for any significant sore throat, dysphagia, itching, sneezing, nasal congestion or excess/ purulent secretions, fever, chills, sweats, unintended wt loss, pleuritic or exertional cp, hempoptysis, orthopnea pnd or change in chronic leg swelling. Also denies presyncope, palpitations, heartburn, abdominal pain, nausea, vomiting, diarrhea or change in bowel or urinary habits, dysuria,hematuria, rash, arthralgias, visual complaints, headache, numbness weakness or ataxia. ? ?   ?Objective:  ? Physical Exam ? ?Gen. Pleasant, obese, in no distress, normal affect ?ENT - no pallor,icterus, no post nasal drip, class 2-3 airway ?Neck: No JVD, no thyromegaly, no carotid bruits ?Lungs: no use of accessory muscles, no dullness to percussion, decreased without rales or rhonchi  ?Cardiovascular: Rhythm regular, heart sounds  normal, no murmurs or gallops, 2+ peripheral edema ?Abdomen: soft and non-tender, no hepatosplenomegaly, BS normal. ?Musculoskeletal: No deformities, no cyanosis or clubbing ?Neuro:  alert, non focal, no tremors ? ? ? ?   ?Assessment & Plan:  ? ? ?

## 2021-06-21 NOTE — Assessment & Plan Note (Signed)
Smoking cessation was again emphasized is the most important intervention that would add years to her life. ?She is using nicotine patches and nicotine gum but is still having breakthrough, we will provide her with Nicotrol Inhaler prescription to use ?

## 2021-06-21 NOTE — Patient Instructions (Addendum)
X Rx for nicotrol inhaler #150 - take as needed ? ?X Prednisone 10 mg tabs  Take 2 tabs daily with food x 10ds, then 1 tab daily with food x 10ds then STOP ? ?X Blood work ? ?

## 2021-06-21 NOTE — Addendum Note (Signed)
Addended by: Retia Passe on: 06/21/2021 02:48 PM ? ? Modules accepted: Orders ? ?

## 2021-06-21 NOTE — Assessment & Plan Note (Signed)
CPAP download was reviewed which shows excellent control of events on 13 cm with mild leak and good compliance without any missed nights ? ?Weight loss encouraged, compliance with goal of at least 4-6 hrs every night is the expectation. ?Advised against medications with sedative side effects ?Cautioned against driving when sleepy - understanding that sleepiness will vary on a day to day basis ? ?

## 2021-06-21 NOTE — Assessment & Plan Note (Signed)
HRCT suggest alternative diagnosis-differential includes RB ILD versus hypersensitivity pneumonitis, this last HRCT seems to suggest latter. ?We reviewed environmental history and no obvious exposure. ?Previously prednisone has helped.  Will obtain serology including hypersensitivity profile. ?We will use low-dose prednisone for a couple of weeks, mainly to help with allergies and other causes of shortness of breath but see if this remarkably improves as it has done in the past. ?Long-term steroids is not an option, she is not a good candidate for transbronchial biopsy ?

## 2021-06-22 ENCOUNTER — Telehealth: Payer: Self-pay | Admitting: Pulmonary Disease

## 2021-06-22 ENCOUNTER — Other Ambulatory Visit: Payer: Medicare Other

## 2021-06-22 DIAGNOSIS — Z720 Tobacco use: Secondary | ICD-10-CM

## 2021-06-22 DIAGNOSIS — J849 Interstitial pulmonary disease, unspecified: Secondary | ICD-10-CM | POA: Diagnosis not present

## 2021-06-22 MED ORDER — NICOTINE 10 MG IN INHA: 1.0000 | RESPIRATORY_TRACT | 0 refills | Status: DC | PRN

## 2021-06-22 MED ORDER — TRELEGY ELLIPTA 100-62.5-25 MCG/ACT IN AEPB: 1.0000 | INHALATION_SPRAY | Freq: Every day | RESPIRATORY_TRACT | 0 refills | Status: DC

## 2021-06-22 NOTE — Addendum Note (Signed)
Addended by: Valerie Salts on: 06/22/2021 11:03 AM ? ? Modules accepted: Orders ? ?

## 2021-06-22 NOTE — Telephone Encounter (Signed)
Called [facility] and spoke to [facility] about inhaler. Clarified order with no issues. Nothing further needed  ?

## 2021-06-22 NOTE — Telephone Encounter (Signed)
Patient came into the office this morning to see if we had a sample of Trelegy 164mg. She stated that the pharmacy has the prescription but they had to order the medication. They will not have it in stock until Friday and she has been without it already to 2 days. I provided her with a sample.  ? ?While she was here, she also stated that the pharmacist had questions about the Nicotrol inhaler. Per the pharmacist, the instructions were not clear. She is aware that this isn't a scheduled medication. I advised her that I would call the pharmacy for her.  ? ?Called and spoke with pharmacist. She stated that she was not able to find the nicotine inhaler on her profile. She searched under the generic and brand name and still could not find it. She requested that I send the prescription again.  ? ?RX has been sent.  ? ?Nothing further needed at time of call.  ?

## 2021-06-24 ENCOUNTER — Telehealth: Payer: Self-pay | Admitting: Pulmonary Disease

## 2021-06-24 LAB — ANGIOTENSIN CONVERTING ENZYME: Angiotensin-Converting Enzyme: 30 U/L (ref 9–67)

## 2021-06-24 LAB — CYCLIC CITRUL PEPTIDE ANTIBODY, IGG: Cyclic Citrullin Peptide Ab: <16 UNITS

## 2021-06-24 LAB — ANA: Anti Nuclear Antibody (ANA): NEGATIVE

## 2021-06-24 MED ORDER — PREDNISONE 10 MG PO TABS: 10.0000 mg | ORAL_TABLET | Freq: Every day | ORAL | 0 refills | Status: DC

## 2021-06-24 NOTE — Telephone Encounter (Signed)
Called and spoke with pt who stated that she received her prednisone Rx but states that she did not receive enough quantity to make up the instructions. Pt only received 20tabs of prednisone which would only cover the 2 pills for 10 days and would not be enough to cover the 1 pill for 10 days. ? ? ?Stated to pt that I was going to send in the remainder Rx to pharmacy and make a comment about it to the pharmacy and she verbalized understanding. Rx has been sent with note in there about that to pharmacy. Nothing further needed. ?

## 2021-06-24 NOTE — Telephone Encounter (Signed)
Attempted to call pt but unable to reach. Left message for her to return call. 

## 2021-06-28 LAB — HYPERSENSITIVITY PNEUMONITIS
A. Pullulans Abs: NEGATIVE
A.Fumigatus #1 Abs: NEGATIVE
Micropolyspora faeni, IgG: NEGATIVE
Pigeon Serum Abs: NEGATIVE
Thermoact. Saccharii: NEGATIVE
Thermoactinomyces vulgaris, IgG: NEGATIVE

## 2021-07-02 DIAGNOSIS — M170 Bilateral primary osteoarthritis of knee: Secondary | ICD-10-CM | POA: Diagnosis not present

## 2021-07-02 DIAGNOSIS — I509 Heart failure, unspecified: Secondary | ICD-10-CM | POA: Diagnosis not present

## 2021-07-02 DIAGNOSIS — G4733 Obstructive sleep apnea (adult) (pediatric): Secondary | ICD-10-CM | POA: Diagnosis not present

## 2021-07-02 DIAGNOSIS — J9611 Chronic respiratory failure with hypoxia: Secondary | ICD-10-CM | POA: Diagnosis not present

## 2021-07-07 ENCOUNTER — Ambulatory Visit: Payer: Commercial Managed Care - HMO | Admitting: Primary Care

## 2021-07-07 DIAGNOSIS — Z79899 Other long term (current) drug therapy: Secondary | ICD-10-CM | POA: Diagnosis not present

## 2021-07-07 DIAGNOSIS — J439 Emphysema, unspecified: Secondary | ICD-10-CM | POA: Diagnosis not present

## 2021-07-07 DIAGNOSIS — I509 Heart failure, unspecified: Secondary | ICD-10-CM | POA: Diagnosis not present

## 2021-07-07 DIAGNOSIS — M170 Bilateral primary osteoarthritis of knee: Secondary | ICD-10-CM | POA: Diagnosis not present

## 2021-07-07 DIAGNOSIS — E1165 Type 2 diabetes mellitus with hyperglycemia: Secondary | ICD-10-CM | POA: Diagnosis not present

## 2021-07-07 DIAGNOSIS — R030 Elevated blood-pressure reading, without diagnosis of hypertension: Secondary | ICD-10-CM | POA: Diagnosis not present

## 2021-07-12 ENCOUNTER — Ambulatory Visit: Payer: Medicare Other

## 2021-07-12 NOTE — Chronic Care Management (AMB) (Signed)
Care Management    RN Visit Note  07/12/2021 Name: Sharon Vasquez [name]: [id number] DOB: 09/04/1950  Subjective: Sharon Vasquez is a 71 y.o. year old female who is a primary care patient of Atway, Rayann N, DO. The care management team was consulted for assistance with disease management and care coordination needs.    Engaged with patient by telephone for follow up visit in response to provider referral for case management and/or care coordination services.   Consent to Services:   Sharon Vasquez was given information about Care Management services today including:  Care Management services includes personalized support from designated clinical staff supervised by her physician, including individualized plan of care and coordination with other care providers 24/7 contact phone numbers for assistance for urgent and routine care needs. The patient may stop case management services at any time by phone call to the office staff.  Patient agreed to services and consent obtained.   Assessment: Review of patient past medical history, allergies, medications, health status, including review of consultants reports, laboratory and other test data, was performed as part of comprehensive evaluation and provision of chronic care management services.   SDOH (Social Determinants of Health) assessments and interventions performed:    Care Plan  No Known Allergies  Outpatient Encounter Medications as of 07/12/2021  Medication Sig Note   albuterol (VENTOLIN HFA) 108 (90 Base) MCG/ACT inhaler Inhale 2 puffs into the lungs every 4 (four) hours as needed for shortness of breath.    aspirin 81 MG chewable tablet Chew 81 mg by mouth daily. 03/16/2021: Med Classification: Hematological Agents   atorvastatin (LIPITOR) 80 MG tablet TAKE 1 TABLET(80 MG) BY MOUTH DAILY Strength: 80 mg    diclofenac Sodium (VOLTAREN) 1 % GEL Apply 2 g topically 4 (four) times daily.    esomeprazole (NEXIUM) 40 MG capsule Take 1  capsule (40 mg total) by mouth daily as needed (for heartburn or indigestion). (Patient taking differently: Take 40 mg by mouth daily.)    Fluticasone-Umeclidin-Vilant (TRELEGY ELLIPTA) 100-62.5-25 MCG/ACT AEPB Inhale 1 puff into the lungs daily.    Fluticasone-Umeclidin-Vilant (TRELEGY ELLIPTA) 100-62.5-25 MCG/ACT AEPB Inhale 1 puff into the lungs daily.    gabapentin (NEURONTIN) 400 MG capsule TAKE 1 CAPSULE BY MOUTH  TWICE DAILY    ibuprofen (ADVIL) 200 MG tablet Take 800 mg by mouth every 6 (six) hours as needed for moderate pain.    ipratropium-albuterol (DUONEB) 0.5-2.5 (3) MG/3ML SOLN Take 3 mLs by nebulization every 6 (six) hours as needed.    isosorbide mononitrate (IMDUR) 30 MG 24 hr tablet TAKE 1 TABLET(30 MG) BY MOUTH DAILY    letrozole (FEMARA) 2.5 MG tablet Take 1 tablet (2.5 mg total) by mouth daily.    metolazone (ZAROXOLYN) 2.5 MG tablet Take 1 tablet (2.5 mg total) by mouth every other day. (Patient taking differently: Take 2.5 mg by mouth daily as needed (extra fluid not covered relieved by torsemide).)    metoprolol succinate (TOPROL XL) 25 MG 24 hr tablet Take 1 tablet (25 mg total) by mouth daily.    mirabegron ER (MYRBETRIQ) 50 MG TB24 tablet Take 1 tablet (50 mg total) by mouth daily.    nicotine (NICODERM CQ - DOSED IN MG/24 HOURS) 21 mg/24hr patch Place 21 mg onto the skin daily.    nicotine (NICOTROL) 10 MG inhaler Inhale 1 Cartridge (1 continuous puffing total) into the lungs as needed for smoking cessation.    oxyCODONE (ROXICODONE) 15 MG immediate release tablet Take 15  mg by mouth every 6 (six) hours as needed for pain.    OXYGEN Inhale 2-3 L into the lungs daily.    oxymetazoline (AFRIN) 0.05 % nasal spray Place 1 spray into both nostrils 2 (two) times daily as needed for congestion.    potassium chloride SA (KLOR-CON M) 20 MEQ tablet Take 1 tablet (20 mEq total) by mouth daily. (Patient taking differently: Take 20 mEq by mouth 2 (two) times daily.)    predniSONE  (DELTASONE) 10 MG tablet Take 1 tablet (10 mg total) by mouth daily with breakfast.    PRESCRIPTION MEDICATION Inhale into the lungs at bedtime. cpap    torsemide (DEMADEX) 20 MG tablet Take 2 tablets by mouth daily, you may take an extra 1/2 tablet only as needed for weight gain of 2 lbs overnight or 5 lbs in a week (Patient taking differently: Take 20 mg by mouth 2 (two) times daily.)    XARELTO 20 MG TABS tablet TAKE 1 TABLET(20 MG) BY MOUTH EVERY MORNING    No facility-administered encounter medications on file as of 07/12/2021.    Patient Active Problem List   Diagnosis Date Noted   New onset type 2 diabetes mellitus (HCC) 04/09/2021   Hypokalemia 04/09/2021   Genetic testing 03/23/2021   Malignant neoplasm of upper-outer quadrant of right breast in female, estrogen receptor positive (HCC) 03/21/2021   Pre-operative respiratory examination 03/16/2021   Right foot pain 03/03/2021   Breast pain, right 01/17/2021   Chronic heart failure with preserved ejection fraction (HFpEF) (HCC)    Paroxysmal atrial fibrillation (HCC)    Osteoarthritis of left hip 10/07/2020   Venous stasis ulcer of left lower extremity (HCC) 08/18/2020   Hyperlipidemia 05/14/2020   Esophageal dysmotility 11/10/2019   Chronic respiratory failure with hypoxia (HCC) 09/30/2019   Mixed incontinence, urge and stress (female) (female) 08/21/2019   Preventative health care 06/04/2019   GERD (gastroesophageal reflux disease) 01/20/2019   ILD (interstitial lung disease) (HCC) 02/13/2018   OSA (obstructive sleep apnea) 11/14/2017   Chronic pain syndrome 07/27/2017   Major depression, recurrent, chronic (HCC) 02/01/2017   Osteoporosis 12/14/2016   Drug induced constipation 08/21/2016   Aortic atherosclerosis (HCC) 07/17/2016   Chronic venous insufficiency 07/12/2016   Chronic anticoagulation 07/12/2016   Tobacco use 07/11/2016   Acute exacerbation of chronic obstructive pulmonary disease (COPD) (HCC)    Diastolic heart  failure (HCC)    History of pulmonary embolism    Atrial fibrillation with controlled ventricular response (HCC)    Vocal cord polyp 12/18/2015   Laryngopharyngeal reflux 12/18/2015   Morbid obesity (HCC)    Peripheral vascular disease (HCC)    Benign essential HTN    Primary osteoarthritis of both knees 09/16/2014    Conditions to be addressed/monitored: CHF and HTN  Care Plan : CCM RN- COPD (Adult)  Updates made by Jodelle Gross, RN since 07/12/2021 12:00 AM     Problem: Symptom Exacerbation (COPD)      Goal: Symptom Exacerbation Prevented or Minimized   Start Date: 06/23/2020  Recent Progress: On track  Priority: High  Note:    Current Barriers:  CHF, HTN, and COPD- spoke with patient via phone to complete follow up assessment.  Patient noted she received a new prescription for Nicotrol Inhaler 10mg  and she is unable to find a pharmacy that has the medication.  This RNCM called [facility] and they do not have the medication either.  Discussed with Sharon Vasquez, Pharmacy patient advocate and she was  unaware of a location that might carry the medication.  Called patient back notified of the attempts to find medication.  Patient understood and did not wish to speak any longer. Plan to follow up in 8-10 weeks. Knowledge Deficits related to plan of care for management of CHF, HTN, and COPD  Chronic Disease Management support and education needs related to CHF, HTN, and COPD  Lacks caregiver support Financial Constraints  RNCM Clinical Goal(s):  Patient will verbalize understanding of plan for management of CHF, HTN, and COPD as evidenced by continued discussions with provider and RNCM. demonstrate Ongoing adherence to prescribed treatment plan for CHF, HTN, and COPD as evidenced by weight and hypertension monitoring. continue to work with RN Care Manager to address care management and care coordination needs related to  CHF, HTN, and COPD as evidenced by  adherence to CM Team Scheduled appointments not experience hospital admission as evidenced by review of EMR. Hospital Admissions in last 6 months = 0  through collaboration with RN Care manager, provider, and care team.  Interventions: 1:1 collaboration with primary care provider regarding development and update of comprehensive plan of care as evidenced by provider attestation and co-signature Inter-disciplinary care team collaboration (see longitudinal plan of care) Evaluation of current treatment plan related to  self management and patient's adherence to plan as established by provider Heart Failure Interventions:  (Status:  Goal on track:  NO.) Long Term Goal Basic overview and discussion of pathophysiology of Heart Failure reviewed Discussed importance of daily weight and advised patient to weigh and record daily Discussed the importance of keeping all appointments with provider COPD Interventions:  (Status:  Goal on track:  NO.) Long Term Goal Advised patient to track and manage COPD triggers Provided instruction about proper use of medications used for management of COPD including inhalers Discussed the importance of adequate rest and management of fatigue with COPD Hypertension Interventions:  (Status:  Goal on track:  Yes.) Long Term Goal Last practice recorded BP readings:  BP Readings from Last 3 Encounters:  06/21/21 138/82  05/10/21 122/79  05/02/21 126/76  Most recent eGFR/CrCl:  Lab Results  Component Value Date   EGFR 76 03/09/2021    No components found for: CRCL  Evaluation of current treatment plan related to hypertension self management and patient's adherence to plan as established by provider Reviewed medications with patient and discussed importance of compliance Discussed plans with patient for ongoing care management follow up and provided patient with direct contact information for care management team Advised patient, providing education and rationale, to  monitor blood pressure daily and record, calling PCP for findings outside established parameters Reviewed scheduled/upcoming provider appointments including:  Patient Goals/Self-Care Activities: Take all medications as prescribed Attend all scheduled provider appointments Call pharmacy for medication refills 3-7 days in advance of running out of medications Perform all self care activities independently  Perform IADL's (shopping, preparing meals, housekeeping, managing finances) independently Call provider office for new concerns or questions  call office if I gain more than 2 pounds in one day or 5 pounds in one week track weight in diary watch for swelling in feet, ankles and legs every day weigh myself daily begin a heart failure diary bring diary to all appointments develop a rescue plan identify and avoid work-related triggers begin a symptom diary follow rescue plan if symptoms flare-up keep follow-up appointments:   check blood pressure 3 times per week choose a place to take my blood pressure (home, clinic or office, retail  store) write blood pressure results in a log or diary keep a blood pressure log call doctor for signs and symptoms of high blood pressure keep all doctor appointments take medications for blood pressure exactly as prescribed  Follow Up Plan:  The patient has been provided with contact information for the care management team and has been advised to call with any health related questions or concerns.   Current Barriers:  Chronic Disease Management support, education, and care coordination needs related to  Clinical Goal(s) related to Atrial Fibrillation, CHF, HTN, and COPD:  Over the next 30 days, patient will:  Work with the care management team to address educational, disease management, and care coordination needs  Begin or continue self health monitoring activities as directed today Measure and record blood pressure 5-7 times per week and assess  swelling in lower extremities daily, check O2 sats when short of breath  Call provider office for new or worsened signs and symptoms Blood pressure findings outside established parameters, breathing outside of baseline,  Shortness of breath, and New or worsened symptom related to CHF and COPD Call care management team with questions or concerns Verbalize basic understanding of patient centered plan of care established today Interventions related to Atrial Fibrillation, CHF, HTN, and COPD:  Appropriate assessments completed Reviewed medications with patient and assessed medication taking behavior;  if indicated, discussed importance of medication adherence  Discussed plans with patient for ongoing care management follow up and ensured patient has contact number for CCM team and clinic  Patient Self Care Activities related to Atrial Fibrillation, CHF, HTN, and COPD:  Patient is unable to independently self-manage chronic health conditions       Plan: The patient has been provided with contact information for the care management team and has been advised to call with any health related questions or concerns.   Jodelle Gross, RN, BSN, CCM Care Management Coordinator [facility] Phone: [phone]/Fax: [phone]

## 2021-07-12 NOTE — Patient Instructions (Signed)
Visit Information ? ?Thank you for taking time to visit with me today. Please don't hesitate to contact me if I can be of assistance to you before our next scheduled telephone appointment. ? ?Our next appointment is by telephone on 09/06/21 at 1PM. ? ?Please call the care guide team at [phone] if you need to cancel or reschedule your appointment.  ? ?If you are experiencing a Mental Health or [place] or need someone to talk to, please call the [place] National Suicide Prevention Lifeline: 905 193 3096 or TTY: 214-406-4045 TTY (775)501-2432) to talk to a trained counselor  ? ?Patient verbalizes understanding of instructions and care plan provided today and agrees to view in [place]. Active MyChart status confirmed with patient.   ? ?The patient has been provided with contact information for the care management team and has been advised to call with any health related questions or concerns.  ? ?Johnney Killian, RN, BSN, CCM ?Care Management Coordinator ?[facility] ?Phone: [phone]/MEB: [phone]  ?

## 2021-07-18 DIAGNOSIS — J449 Chronic obstructive pulmonary disease, unspecified: Secondary | ICD-10-CM | POA: Diagnosis not present

## 2021-07-31 DIAGNOSIS — Z20822 Contact with and (suspected) exposure to covid-19: Secondary | ICD-10-CM | POA: Diagnosis not present

## 2021-08-01 DIAGNOSIS — M170 Bilateral primary osteoarthritis of knee: Secondary | ICD-10-CM | POA: Diagnosis not present

## 2021-08-01 DIAGNOSIS — I509 Heart failure, unspecified: Secondary | ICD-10-CM | POA: Diagnosis not present

## 2021-08-01 DIAGNOSIS — G4733 Obstructive sleep apnea (adult) (pediatric): Secondary | ICD-10-CM | POA: Diagnosis not present

## 2021-08-01 DIAGNOSIS — J9611 Chronic respiratory failure with hypoxia: Secondary | ICD-10-CM | POA: Diagnosis not present

## 2021-08-05 DIAGNOSIS — M170 Bilateral primary osteoarthritis of knee: Secondary | ICD-10-CM | POA: Diagnosis not present

## 2021-08-05 DIAGNOSIS — J439 Emphysema, unspecified: Secondary | ICD-10-CM | POA: Diagnosis not present

## 2021-08-05 DIAGNOSIS — E1165 Type 2 diabetes mellitus with hyperglycemia: Secondary | ICD-10-CM | POA: Diagnosis not present

## 2021-08-05 DIAGNOSIS — R030 Elevated blood-pressure reading, without diagnosis of hypertension: Secondary | ICD-10-CM | POA: Diagnosis not present

## 2021-08-05 DIAGNOSIS — I509 Heart failure, unspecified: Secondary | ICD-10-CM | POA: Diagnosis not present

## 2021-08-05 DIAGNOSIS — Z1152 Encounter for screening for COVID-19: Secondary | ICD-10-CM | POA: Diagnosis not present

## 2021-08-05 DIAGNOSIS — Z79899 Other long term (current) drug therapy: Secondary | ICD-10-CM | POA: Diagnosis not present

## 2021-08-09 ENCOUNTER — Other Ambulatory Visit: Payer: Self-pay | Admitting: Student

## 2021-08-09 DIAGNOSIS — I5032 Chronic diastolic (congestive) heart failure: Secondary | ICD-10-CM

## 2021-08-09 DIAGNOSIS — Z79899 Other long term (current) drug therapy: Secondary | ICD-10-CM | POA: Diagnosis not present

## 2021-08-15 ENCOUNTER — Other Ambulatory Visit: Payer: Self-pay | Admitting: *Deleted

## 2021-08-16 ENCOUNTER — Other Ambulatory Visit: Payer: Self-pay

## 2021-08-16 MED ORDER — METOPROLOL SUCCINATE ER 25 MG PO TB24: 25.0000 mg | ORAL_TABLET | Freq: Every day | ORAL | 0 refills | Status: DC

## 2021-08-17 ENCOUNTER — Ambulatory Visit (INDEPENDENT_AMBULATORY_CARE_PROVIDER_SITE_OTHER): Payer: Medicare Other | Admitting: Cardiovascular Disease

## 2021-08-17 ENCOUNTER — Encounter: Payer: Self-pay | Admitting: Cardiovascular Disease

## 2021-08-17 DIAGNOSIS — I4891 Unspecified atrial fibrillation: Secondary | ICD-10-CM | POA: Diagnosis not present

## 2021-08-17 DIAGNOSIS — I4821 Permanent atrial fibrillation: Secondary | ICD-10-CM

## 2021-08-17 DIAGNOSIS — I5032 Chronic diastolic (congestive) heart failure: Secondary | ICD-10-CM | POA: Diagnosis not present

## 2021-08-17 DIAGNOSIS — Z720 Tobacco use: Secondary | ICD-10-CM

## 2021-08-17 DIAGNOSIS — I10 Essential (primary) hypertension: Secondary | ICD-10-CM | POA: Diagnosis not present

## 2021-08-17 DIAGNOSIS — E782 Mixed hyperlipidemia: Secondary | ICD-10-CM | POA: Diagnosis not present

## 2021-08-17 DIAGNOSIS — J449 Chronic obstructive pulmonary disease, unspecified: Secondary | ICD-10-CM | POA: Diagnosis not present

## 2021-08-17 DIAGNOSIS — G4733 Obstructive sleep apnea (adult) (pediatric): Secondary | ICD-10-CM | POA: Diagnosis not present

## 2021-08-17 NOTE — Assessment & Plan Note (Signed)
History of hyperlipidemia on statin therapy with lipid profile performed 05/14/2020 revealing total cholesterol 142, LDL 81 and HDL 25. ?

## 2021-08-17 NOTE — Assessment & Plan Note (Signed)
Morbid obesity with a BMI close to 50.  She weighs the same today as she did 2 years ago when I saw her.  She is mostly wheelchair-bound because of weight and arthritis.  I am referring her to the [facility] for facilitated weight loss. ?

## 2021-08-17 NOTE — Assessment & Plan Note (Signed)
History of diastolic heart failure in addition to venous insufficiency on Zaroxolyn and torsemide. ?

## 2021-08-17 NOTE — Assessment & Plan Note (Signed)
History of essential hypertension a blood pressure measured today at 160/110.  She says she did not take her blood pressure medicines this morning.  She is on metoprolol. ?

## 2021-08-17 NOTE — Assessment & Plan Note (Signed)
>>  ASSESSMENT AND PLAN FOR DIASTOLIC HEART FAILURE (HCC) WRITTEN ON 08/17/2021  9:58 AM BY BERRY, Delton See, MD  History of diastolic heart failure in addition to venous insufficiency on Zaroxolyn and torsemide.

## 2021-08-17 NOTE — Progress Notes (Signed)
? ? ? ?08/17/2021 ?Sharon Vasquez   ?14-Aug-1950  ?[id number] ? ?Primary Physician [name], DO ?Primary Cardiologist: Lorretta Harp MD [facility], [place], [place] ? ?HPI:  Sharon Vasquez is a 71 y.o.  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  I last saw her in the office 10/24/2019.  She has a history of treated hypertension hyperlipidemia.  She is had a pulmonary embolus in the past on Xarelto oral anticoagulation.  Sister recently had a myocardial infarction.  She has greater than 100 pack years of tobacco abuse as well and continues to smoke.  She is never had a heart attack or stroke.  She did have a high resolution chest CT performed 12/31/2017 that showed left main/three-vessel coronary calcification.  She does complain of some atypical chest pain.  She recently had a sleep study and was begun on CPAP.  She is aware of salt restriction. ?  ? She had a coronary CTA/FFR which showed a physiologically significant lesion in a small nondominant RCA.  She is also gained 20 pound since I saw her last.  She complains of significant shortness of breath, dyspnea on exertion and orthopnea.  Her last 2D echocardiogram performed 07/12/2016 showed normal LV systolic function. ?  ?Since I last saw her 2 years ago she has remained stable.  She has chronic shortness of breath.  She is essentially wheelchair-bound and a Hoveround because of morbid obesity and arthritis.  Her weight has remained stable over the last 2 years.  She has stopped smoking recently and is on a nicotine inhaler followed by Dr. Elsworth Soho.  2D echo performed 10/08/2020 revealed normal LV systolic function, moderate LVH with mild pulmonary hypertension.  There was mild to moderate TR but no evidence of MR. ? ?Current Meds  ?Medication Sig  ? albuterol (VENTOLIN  HFA) 108 (90 Base) MCG/ACT inhaler Inhale 2 puffs into the lungs every 4 (four) hours as needed for shortness of breath.  ? aspirin 81 MG chewable tablet Chew 81 mg by mouth daily.  ? atorvastatin (LIPITOR) 80 MG tablet TAKE 1 TABLET(80 MG) BY MOUTH DAILY ?Strength: 80 mg  ? esomeprazole (NEXIUM) 40 MG capsule Take 1 capsule (40 mg total) by mouth daily as needed (for heartburn or indigestion). (Patient taking differently: Take 40 mg by mouth daily.)  ? Fluticasone-Umeclidin-Vilant (TRELEGY ELLIPTA) 100-62.5-25 MCG/ACT AEPB Inhale 1 puff into the lungs daily.  ? Fluticasone-Umeclidin-Vilant (TRELEGY ELLIPTA) 100-62.5-25 MCG/ACT AEPB Inhale 1 puff into the lungs daily.  ? gabapentin (NEURONTIN) 400 MG capsule TAKE 1 CAPSULE BY MOUTH  TWICE DAILY  ? ibuprofen (ADVIL) 200 MG tablet Take 800 mg by mouth every 6 (six) hours as needed for moderate pain.  ? ipratropium-albuterol (DUONEB) 0.5-2.5 (3) MG/3ML SOLN Take 3 mLs by nebulization every 6 (six) hours as needed.  ? isosorbide mononitrate (IMDUR) 30 MG 24 hr tablet TAKE 1 TABLET(30 MG) BY MOUTH DAILY  ? letrozole (FEMARA) 2.5 MG tablet Take 1 tablet (2.5 mg total) by mouth daily.  ? metolazone (ZAROXOLYN) 2.5 MG tablet Take 1 tablet (2.5 mg total) by mouth every other day. (Patient taking differently: Take 2.5 mg by mouth daily as needed (extra fluid not covered relieved by torsemide).)  ? metoprolol succinate (TOPROL XL) 25 MG 24 hr tablet Take 1 tablet (25 mg total) by  mouth daily.  ? mirabegron ER (MYRBETRIQ) 50 MG TB24 tablet Take 1 tablet (50 mg total) by mouth daily.  ? nicotine (NICODERM CQ - DOSED IN MG/24 HOURS) 21 mg/24hr patch Place 21 mg onto the skin daily.  ? nicotine (NICOTROL) 10 MG inhaler Inhale 1 Cartridge (1 continuous puffing total) into the lungs as needed for smoking cessation.  ? oxyCODONE (ROXICODONE) 15 MG immediate release tablet Take 15 mg by mouth every 6 (six) hours as needed for pain.  ? OXYGEN Inhale 2-3 L into the lungs daily.  ?  oxymetazoline (AFRIN) 0.05 % nasal spray Place 1 spray into both nostrils 2 (two) times daily as needed for congestion.  ? potassium chloride SA (KLOR-CON M) 20 MEQ tablet TAKE 1 TABLET BY MOUTH DAILY  ? predniSONE (DELTASONE) 10 MG tablet Take 1 tablet (10 mg total) by mouth daily with breakfast.  ? PRESCRIPTION MEDICATION Inhale into the lungs at bedtime. cpap  ? torsemide (DEMADEX) 20 MG tablet Take 2 tablets by mouth daily, you may take an extra 1/2 tablet only as needed for weight gain of 2 lbs overnight or 5 lbs in a week (Patient taking differently: Take 20 mg by mouth 2 (two) times daily.)  ? XARELTO 20 MG TABS tablet TAKE 1 TABLET(20 MG) BY MOUTH EVERY MORNING  ?  ? ?No Known Allergies ? ?Social History  ? ?Socioeconomic History  ? Marital status: Divorced  ?  Spouse name: Not on file  ? Number of children: Not on file  ? Years of education: Not on file  ? Highest education level: Not on file  ?Occupational History  ? Not on file  ?Tobacco Use  ? Smoking status: Some Days  ?  Packs/day: 2.00  ?  Years: 55.00  ?  Pack years: 110.00  ?  Types: Cigarettes  ?  Start date: 08/16/1961  ? Smokeless tobacco: Never  ?Vaping Use  ? Vaping Use: Never used  ?Substance and Sexual Activity  ? Alcohol use: No  ? Drug use: No  ? Sexual activity: Not Currently  ?  Partners: Male  ?Other Topics Concern  ? Not on file  ?Social History Narrative  ? Lives in [facility] in [place]. Lives alone, but is dependent in ADLs/IADLs. Previously had [employer] PT and RN but dismissed them with plans to use the [employer]. Two daughters live nearby. Previously resided in [place].  ? ?Social Determinants of Health  ? ?Financial Resource Strain: Not on file  ?Food Insecurity: No Food Insecurity  ? Worried About Charity fundraiser in the Last Year: Never true  ? Ran Out of Food in the Last Year: Never true  ?Transportation Needs: No Transportation Needs  ? Lack of Transportation (Medical): No  ? Lack of Transportation (Non-Medical): No   ?Physical Activity: Not on file  ?Stress: Not on file  ?Social Connections: Not on file  ?Intimate Partner Violence: Not on file  ?  ? ?Review of Systems: ?General: negative for chills, fever, night sweats or weight changes.  ?Cardiovascular: negative for chest pain, dyspnea on exertion, edema, orthopnea, palpitations, paroxysmal nocturnal dyspnea or shortness of breath ?Dermatological: negative for rash ?Respiratory: negative for cough or wheezing ?Urologic: negative for hematuria ?Abdominal: negative for nausea, vomiting, diarrhea, bright red blood per rectum, melena, or hematemesis ?Neurologic: negative for visual changes, syncope, or dizziness ?All other systems reviewed and are otherwise negative except as noted above. ? ? ? ?Blood pressure (!) 160/110, pulse 83, height '6\' 2"'$  (  1.88 m), weight (!) 376 lb 12.8 oz (170.9 kg), SpO2 94 %.  ?General appearance: alert and no distress ?Neck: no adenopathy, no carotid bruit, no JVD, supple, symmetrical, trachea midline, and thyroid not enlarged, symmetric, no tenderness/mass/nodules ?Lungs: clear to auscultation bilaterally ?Heart: irregularly irregular rhythm ?Extremities: Chronic bilateral lower extremity brawny edema. ?Pulses: 2+ and symmetric ?Skin: Skin color, texture, turgor normal. No rashes or lesions ?Neurologic: Grossly normal ? ?EKG atrial fibrillation with a ventricular sponsor of 83, left axis deviation with septal Q waves.  I personally reviewed this EKG. ? ?ASSESSMENT AND PLAN:  ? ?Morbid obesity ([place]) ?Morbid obesity with a BMI close to 50.  She weighs the same today as she did 2 years ago when I saw her.  She is mostly wheelchair-bound because of weight and arthritis.  I am [facility] to [facility] for [facility] loss. ? ?Benign essential HTN ?History of essential hypertension a blood pressure measured today at 160/110.  She says she did not take her blood pressure medicines this morning.  She is on metoprolol. ? ?Tobacco  use ?History of recently discontinued tobacco abuse having smoked for many years on a nicotine inhaler followed by Dr. Elsworth Soho. ? ?Diastolic heart failure ([place]) ?History of diastolic heart failure in addition to

## 2021-08-17 NOTE — Assessment & Plan Note (Signed)
History of recently discontinued tobacco abuse having smoked for many years on a nicotine inhaler followed by Dr. Elsworth Soho. ?

## 2021-08-17 NOTE — Assessment & Plan Note (Signed)
History of obstructive sleep apnea on CPAP. 

## 2021-08-17 NOTE — Assessment & Plan Note (Signed)
History of persistent A. fib rate controlled on Xarelto oral anticoagulation. 

## 2021-08-17 NOTE — Patient Instructions (Signed)
Medication Instructions:  ?Your physician recommends that you continue on your current medications as directed. Please refer to the Current Medication list given to you today. ? ?*If you need a refill on your cardiac medications before your next appointment, please call your pharmacy* ? ? ?Follow-Up: ?At [facility], you and your health needs are our priority.  As part of our continuing mission to provide you with exceptional heart care, we have created designated Provider Care Teams.  These Care Teams include your primary Cardiologist (physician) and Advanced Practice Providers (APPs -  Physician Assistants and Nurse Practitioners) who all work together to provide you with the care you need, when you need it. ? ?We recommend signing up for the patient portal called "MyChart".  Sign up information is provided on this After Visit Summary.  MyChart is used to connect with patients for Virtual Visits (Telemedicine).  Patients are able to view lab/test results, encounter notes, upcoming appointments, etc.  Non-urgent messages can be sent to your provider as well.   ?To learn more about what you can do with MyChart, go to [email or web address].   ? ?Your next appointment:   ?6 month(s) ? ?The format for your next appointment:   ?In Person ? ?Provider:   ?Jesse Cleaver, FNP, Angela Duke, PA-C, Callie Goodrich, PA-C, Jennifer, Lambert, PA-C, Kathryn Lawrence, DNP, ANP, Hao Meng, PA-C, or Emily Monge, NP     ? ? ?Then, Jonathan Berry, MD will plan to see you again in 12 month(s). ?

## 2021-08-25 ENCOUNTER — Other Ambulatory Visit: Payer: Self-pay

## 2021-08-25 ENCOUNTER — Ambulatory Visit (HOSPITAL_COMMUNITY)
Admission: RE | Admit: 2021-08-25 | Discharge: 2021-08-25 | Disposition: A | Discharge status: Home / Self Care | Payer: Medicare Other | Source: Ambulatory Visit | Attending: Internal Medicine | Admitting: Internal Medicine

## 2021-08-25 ENCOUNTER — Encounter: Payer: Self-pay | Admitting: Internal Medicine

## 2021-08-25 ENCOUNTER — Ambulatory Visit (INDEPENDENT_AMBULATORY_CARE_PROVIDER_SITE_OTHER): Payer: Medicare Other | Admitting: Internal Medicine

## 2021-08-25 VITALS — BP 145/94 | HR 62 | Temp 97.8°F | Ht 74.0 in | Wt 363.0 lb

## 2021-08-25 DIAGNOSIS — M1712 Unilateral primary osteoarthritis, left knee: Secondary | ICD-10-CM | POA: Diagnosis not present

## 2021-08-25 DIAGNOSIS — G4733 Obstructive sleep apnea (adult) (pediatric): Secondary | ICD-10-CM | POA: Diagnosis not present

## 2021-08-25 DIAGNOSIS — F17210 Nicotine dependence, cigarettes, uncomplicated: Secondary | ICD-10-CM

## 2021-08-25 DIAGNOSIS — E782 Mixed hyperlipidemia: Secondary | ICD-10-CM

## 2021-08-25 DIAGNOSIS — J439 Emphysema, unspecified: Secondary | ICD-10-CM

## 2021-08-25 DIAGNOSIS — E119 Type 2 diabetes mellitus without complications: Secondary | ICD-10-CM | POA: Diagnosis not present

## 2021-08-25 DIAGNOSIS — I480 Paroxysmal atrial fibrillation: Secondary | ICD-10-CM | POA: Diagnosis not present

## 2021-08-25 DIAGNOSIS — Z720 Tobacco use: Secondary | ICD-10-CM

## 2021-08-25 DIAGNOSIS — I10 Essential (primary) hypertension: Secondary | ICD-10-CM

## 2021-08-25 DIAGNOSIS — R7303 Prediabetes: Secondary | ICD-10-CM

## 2021-08-25 DIAGNOSIS — M170 Bilateral primary osteoarthritis of knee: Secondary | ICD-10-CM

## 2021-08-25 LAB — POCT GLYCOSYLATED HEMOGLOBIN (HGB A1C): Hemoglobin A1C: 6.2 % — AB (ref 4.0–5.6)

## 2021-08-25 LAB — GLUCOSE, CAPILLARY: Glucose-Capillary: 108 mg/dL — ABNORMAL HIGH (ref 70–99)

## 2021-08-25 MED ORDER — SEMAGLUTIDE(0.25 OR 0.5MG/DOS) 2 MG/3ML ~~LOC~~ SOPN: 0.2500 [IU] | PEN_INJECTOR | SUBCUTANEOUS | 2 refills | Status: DC

## 2021-08-25 MED ORDER — ALBUTEROL SULFATE HFA 108 (90 BASE) MCG/ACT IN AERS: 2.0000 | INHALATION_SPRAY | RESPIRATORY_TRACT | 0 refills | Status: DC | PRN

## 2021-08-25 MED ORDER — LOSARTAN POTASSIUM 25 MG PO TABS: 25.0000 mg | ORAL_TABLET | Freq: Every day | ORAL | 0 refills | Status: DC

## 2021-08-25 MED ORDER — ATORVASTATIN CALCIUM 80 MG PO TABS: ORAL_TABLET | ORAL | 0 refills | Status: DC

## 2021-08-25 NOTE — Assessment & Plan Note (Addendum)
Patient's blood pressure not at goal, 145/94.  On prior office visit blood pressure was also elevated.  Home medications include metoprolol succinate '25mg'$  daily.  In the past patient was on 2 antihypertensives HCTZ and olmesartan.  She tolerated those medications well.  Those medications have since been discontinued when her blood pressure was well controlled over several years.  Given extensive cardiovascular history and current uncontrolled blood pressure, patient would likely benefit from a second agent.  CMP obtained January 2023, creatinine of 1.04 and no electrolyte derangement.  Microalbumin to creatinine ratio ordered, patient unable to provide urine sample.  P: -Started on losartan 25 mg daily; titrate up as needed -Continue metoprolol to succinate 25 mg daily -Repeat BMP at follow-up visit -Obtain microalbumin creatinine ratio next visit -Follow-up in 3 months

## 2021-08-25 NOTE — Assessment & Plan Note (Addendum)
Patient uses her CPAP machine intermittently.  She inquires about a newer machine.  Patient will have to have CPAP machine for at least 5 years prior to requesting a new machine.  Patient received machine in 2019, it has been 4 years thus far.  Consider new machine next year.   P: -Continue CPAP nightly

## 2021-08-25 NOTE — Progress Notes (Signed)
CC: BP check, A1c check, routine follow-up  HPI:  [name] is a 71 y.o. female with a past medical history stated below and presents today for CC listed above. Please see problem based assessment and plan for additional details.  Past Medical History:  Diagnosis Date   A-fib [facility])    Anxiety    Arthritis    "qwhre; joints, back" (04/17/2017)   Benign breast cyst in female, left 01/08/2017   Found by Screening mammogram, evaluated by U/S on 01/08/17 and determined to be a benign simple breast cyst.   Breast cancer ([place])    Cellulitis of left lower leg 05/30/2017   CHF (congestive heart failure) (HCC)    Chronic low back pain 08/21/2016   Chronic lower back pain    Chronic venous insufficiency    /notes 05/30/2017   COPD (chronic obstructive pulmonary disease) ([place])    Depression    Diabetes mellitus without complication ([place])    DVT (deep venous thrombosis) ([place]) 11/16/2016   Dysrhythmia    GERD (gastroesophageal reflux disease)    Headache    "weekly for the last 3 months" (04/17/2017)   Hyperlipidemia    Hypertension    Morbid obesity ([place])    PE (pulmonary embolism)    Pulmonary embolism ([place]) 09/21/2014   Sleep apnea     Current Outpatient Medications on File Prior to Visit  Medication Sig Dispense Refill   aspirin 81 MG chewable tablet Chew 81 mg by mouth daily.     esomeprazole (NEXIUM) 40 MG capsule Take 1 capsule (40 mg total) by mouth daily as needed (for heartburn or indigestion). (Patient taking differently: Take 40 mg by mouth daily.) 90 capsule 1   Fluticasone-Umeclidin-Vilant (TRELEGY ELLIPTA) 100-62.5-25 MCG/ACT AEPB Inhale 1 puff into the lungs daily. 60 each 5   Fluticasone-Umeclidin-Vilant (TRELEGY ELLIPTA) 100-62.5-25 MCG/ACT AEPB Inhale 1 puff into the lungs daily. 1 each 0   gabapentin (NEURONTIN) 400 MG capsule TAKE 1 CAPSULE BY MOUTH  TWICE DAILY 180 capsule 3   ibuprofen (ADVIL) 200 MG tablet Take 800 mg by mouth every 6 (six) hours as  needed for moderate pain.     ipratropium-albuterol (DUONEB) 0.5-2.5 (3) MG/3ML SOLN Take 3 mLs by nebulization every 6 (six) hours as needed. 360 mL 3   isosorbide mononitrate (IMDUR) 30 MG 24 hr tablet TAKE 1 TABLET(30 MG) BY MOUTH DAILY 90 tablet 1   letrozole (FEMARA) 2.5 MG tablet Take 1 tablet (2.5 mg total) by mouth daily. 30 tablet 6   metolazone (ZAROXOLYN) 2.5 MG tablet Take 1 tablet (2.5 mg total) by mouth every other day. (Patient taking differently: Take 2.5 mg by mouth daily as needed (extra fluid not covered relieved by torsemide).) 30 tablet 1   metoprolol succinate (TOPROL XL) 25 MG 24 hr tablet Take 1 tablet (25 mg total) by mouth daily. 90 tablet 0   mirabegron ER (MYRBETRIQ) 50 MG TB24 tablet Take 1 tablet (50 mg total) by mouth daily. 90 tablet 1   nicotine (NICODERM CQ - DOSED IN MG/24 HOURS) 21 mg/24hr patch Place 21 mg onto the skin daily.     nicotine (NICOTROL) 10 MG inhaler Inhale 1 Cartridge (1 continuous puffing total) into the lungs as needed for smoking cessation. 150 each 0   oxyCODONE (ROXICODONE) 15 MG immediate release tablet Take 15 mg by mouth every 6 (six) hours as needed for pain.     OXYGEN Inhale 2-3 L into the lungs daily.  oxymetazoline (AFRIN) 0.05 % nasal spray Place 1 spray into both nostrils 2 (two) times daily as needed for congestion.     potassium chloride SA (KLOR-CON M) 20 MEQ tablet TAKE 1 TABLET BY MOUTH DAILY 90 tablet 0   predniSONE (DELTASONE) 10 MG tablet Take 1 tablet (10 mg total) by mouth daily with breakfast. 10 tablet 0   PRESCRIPTION MEDICATION Inhale into the lungs at bedtime. cpap     torsemide (DEMADEX) 20 MG tablet Take 2 tablets by mouth daily, you may take an extra 1/2 tablet only as needed for weight gain of 2 lbs overnight or 5 lbs in a week (Patient taking differently: Take 20 mg by mouth 2 (two) times daily.) 225 tablet 1   XARELTO 20 MG TABS tablet TAKE 1 TABLET(20 MG) BY MOUTH EVERY MORNING 90 tablet 3   No current  facility-administered medications on file prior to visit.    Family History  Problem Relation Age of Onset   Breast cancer Mother        before age 10   Hypertension Mother    Hyperlipidemia Mother    Hypertension Maternal Grandfather    Hyperlipidemia Maternal Grandfather     Social History   Socioeconomic History   Marital status: Divorced    Spouse name: Not on file   Number of children: Not on file   Years of education: Not on file   Highest education level: Not on file  Occupational History   Not on file  Tobacco Use   Smoking status: Some Days    Packs/day: 2.00    Years: 55.00    Pack years: 110.00    Types: Cigarettes    Start date: 08/16/1961   Smokeless tobacco: Never  Vaping Use   Vaping Use: Never used  Substance and Sexual Activity   Alcohol use: No   Drug use: No   Sexual activity: Not Currently    Partners: Male  Other Topics Concern   Not on file  Social History Narrative   Lives in [facility] in [place]. Lives alone, but is dependent in ADLs/IADLs. 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 Two daughters live nearby. Previously resided in [place].   Social Determinants of Health   Financial Resource Strain: Not on file  Food Insecurity: No Food Insecurity   Worried About Charity fundraiser in the Last Year: Never true   Ran Out of Food in the Last Year: Never true  Transportation Needs: No Transportation Needs   Lack of Transportation (Medical): No   Lack of Transportation (Non-Medical): No  Physical Activity: Not on file  Stress: Not on file  Social Connections: Not on file  Intimate Partner Violence: Not on file    Review of Systems: ROS negative except for what is noted on the assessment and plan.  Vitals:   08/25/21 0903 08/25/21 0922  BP: (!) 160/108 (!) 145/94  Pulse: 62   Temp: 97.8 F (36.6 C)   TempSrc: Oral   SpO2: 95%   Weight: (!) 363 lb (164.7 kg)   Height: '6\' 2"'$  (1.88  m)      Physical Exam: Constitutional: Morbidly obese elderly woman sitting in mobile wheelchair, in no acute distress HENT: normocephalic atraumatic, mucous membranes moist Eyes: conjunctiva non-erythematous Neck: supple Cardiovascular: regular rate and irregular rhythm, no m/r/g; bilateral lower extremity edema with skin changes consistent with chronic venous stasis; distal pulses diminished. Pulmonary/Chest: normal work  of breathing on room air, lungs clear to auscultation bilaterally posteriorly; wheezing appreciated anteriorly Abdominal: soft, non-tender, non-distended MSK: normal bulk and tone; overgrown toenails of bilateral feet. Neurological: alert & oriented x 3,  Skin: warm and dry Psych: Normal mood, normal behavior   Assessment & Plan:   See Encounters Tab for problem based charting.  Patient discussed with Dr. Blenda Bridegroom, M.D. [facility], PGY-1 Pager: [phone], Phone: [phone] Date 08/25/2021 Time 11:33 AM

## 2021-08-25 NOTE — Assessment & Plan Note (Addendum)
Home medication include Xarelto 20 mg daily.  Patient is compliant with this medication.  She denies chest pain.  However does endorse palpitations, which is chronic for her.  On physical exam, heart sounds normal, irregular rhythm, no murmurs rubs or gallops. She follows Dr. Gwenlyn Found, cardiology.   P: -Continue Xarelto 20 mg daily

## 2021-08-25 NOTE — Assessment & Plan Note (Addendum)
Home medication includes Trelegy 1 puff daily and albuterol inhaler as needed.  Patient is on home oxygen 2 to 3 L throughout the day.  She especially needs her oxygen when performing daily ADLs and or running errands.  She follows Dr. Elsworth Soho, pulmonology.  Patient notes audible wheezing since running out of her albuterol inhaler.  On exam, she is in no respiratory distress, normal work of breathing, wheezing appreciated anteriorly; clear lungs sounds posteriorly.   P: -Albuterol inhaler refilled -Follow-up in 3 months

## 2021-08-25 NOTE — Assessment & Plan Note (Signed)
Patient states that she has been working to improve her diet.  She has cut back on sweets and sugary meals.  She was counseled extensively on the benefit of proper diet and weight loss.  Due to her morbid obesity, she is no longer ambulatory, using a motor wheelchair.  Exercise is limited as a result.  She defers referral to dietitian at this time.  She states she is following up with weight management clinic.  This was referred to her by [place] her prediabetes and CVD history patient would likely benefit from GLP-1 to help with weight loss management.  P: -Follow-up with weight management clinic -Started on Ozempic 0.25 mg weekly -Encourage diet and exercise as able

## 2021-08-25 NOTE — Assessment & Plan Note (Addendum)
Patient has extensive smoking history with over 50 pack years.  She started smoking at the age of 44.  Consuming 2 to 3 packs daily.  She has since cut back over the last few years.  She currently smokes 2-3 single cigarettes per day.  1 pack of cigarettes can last her 1.5 weeks.  She was recently started on nicotine inhalers by Dr. Elsworth Soho, pulmonologist.  She would like to continue to cut back and hopefully quit soon.  She is not interested in pharmacologic therapy for smoke cessation at this time.  P: -Continue nicotine inhaler -Encouraged smoke cessation -[place] in 3 months

## 2021-08-25 NOTE — Assessment & Plan Note (Signed)
Patient was referred to pain management due to severe pain from her osteoarthritis of both knees.  She was instructed to obtained x-ray imaging of bilateral knees.  At baseline, patient is not ambulatory and uses a motor wheelchair.  She is able to perform her ADLs independently however limited with ambulation due to the pain.   P: -Defer to pain -X-ray of bilateral knees (standing and lateral views)

## 2021-08-25 NOTE — Patient Instructions (Addendum)
Thank you, Ms.Melinna Linarez Muscat for allowing [place] to provide your care today.   I have ordered the following labs for you:  Lab Orders         Microalbumin / Creatinine Urine Ratio         Lipid Profile         Glucose, capillary         POC Hbg A1C       I have ordered the following medications:   Start the following medications: Meds ordered this encounter  Medications   albuterol (VENTOLIN HFA) 108 (90 Base) MCG/ACT inhaler    Sig: Inhale 2 puffs into the lungs every 4 (four) hours as needed for shortness of breath.    Dispense:  18 g    Refill:  0   atorvastatin (LIPITOR) 80 MG tablet    Sig: TAKE 1 TABLET(80 MG) BY MOUTH DAILY Strength: 80 mg    Dispense:  90 tablet    Refill:  0    **Patient requests 90 days supply**   Semaglutide,0.25 or 0.'5MG'$ /DOS, 2 MG/3ML SOPN    Sig: Inject 0.25 Units into the skin once a week.    Dispense:  3 mL    Refill:  2   losartan (COZAAR) 25 MG tablet    Sig: Take 1 tablet (25 mg total) by mouth daily.    Dispense:  90 tablet    Refill:  0     Follow up: 3 months   Remember:   Should you have any questions or concerns please call the internal medicine clinic at [phone].    Timothy Lasso, MD [place]

## 2021-08-25 NOTE — Assessment & Plan Note (Addendum)
Per chart review, A1c obtained 2 years ago was in the prediabetes range at 5.9%.  A1c obtained today is 6.2%.  Patient is still in the prediabetes range.  She has never been on antidiabetic medications nor insulin.  She states that she has been trying to improve her diet, by decreasing her sweet and sugary intake.  She tries to find alternatives with sugar-free foods.  Given her comorbidities of morbid obesity and cardiovascular disease, patient would likely benefit from GLP-1.  Patient was counseled extensively on the benefits of weight loss and proper diet.  She acknowledges and agrees.   P: -Started on Ozempic 0.25 mg weekly  - lipid panel obtained today -Diabetic foot exam obtained today -Referral to podiatry

## 2021-08-26 LAB — LIPID PANEL
Chol/HDL Ratio: 3.5 ratio (ref 0.0–4.4)
Cholesterol, Total: 91 mg/dL — ABNORMAL LOW (ref 100–199)
HDL: 26 mg/dL — ABNORMAL LOW (ref >39)
LDL Chol Calc (NIH): 46 mg/dL (ref 0–99)
Triglycerides: 99 mg/dL (ref 0–149)
VLDL Cholesterol Cal: 19 mg/dL (ref 5–40)

## 2021-08-31 NOTE — Progress Notes (Signed)
Internal Medicine Clinic Attending ? ?Case discussed with Dr. Ariwodo  At the time of the visit.  We reviewed the resident?s history and exam and pertinent patient test results.  I agree with the assessment, diagnosis, and plan of care documented in the resident?s note.  ?

## 2021-09-01 DIAGNOSIS — G4733 Obstructive sleep apnea (adult) (pediatric): Secondary | ICD-10-CM | POA: Diagnosis not present

## 2021-09-01 DIAGNOSIS — I509 Heart failure, unspecified: Secondary | ICD-10-CM | POA: Diagnosis not present

## 2021-09-01 DIAGNOSIS — J9611 Chronic respiratory failure with hypoxia: Secondary | ICD-10-CM | POA: Diagnosis not present

## 2021-09-01 DIAGNOSIS — M170 Bilateral primary osteoarthritis of knee: Secondary | ICD-10-CM | POA: Diagnosis not present

## 2021-09-02 DIAGNOSIS — E1165 Type 2 diabetes mellitus with hyperglycemia: Secondary | ICD-10-CM | POA: Diagnosis not present

## 2021-09-02 DIAGNOSIS — M170 Bilateral primary osteoarthritis of knee: Secondary | ICD-10-CM | POA: Diagnosis not present

## 2021-09-02 DIAGNOSIS — Z79899 Other long term (current) drug therapy: Secondary | ICD-10-CM | POA: Diagnosis not present

## 2021-09-02 DIAGNOSIS — J439 Emphysema, unspecified: Secondary | ICD-10-CM | POA: Diagnosis not present

## 2021-09-02 DIAGNOSIS — R030 Elevated blood-pressure reading, without diagnosis of hypertension: Secondary | ICD-10-CM | POA: Diagnosis not present

## 2021-09-02 DIAGNOSIS — I509 Heart failure, unspecified: Secondary | ICD-10-CM | POA: Diagnosis not present

## 2021-09-06 ENCOUNTER — Telehealth: Payer: Self-pay

## 2021-09-06 ENCOUNTER — Telehealth: Payer: Medicare Other

## 2021-09-06 DIAGNOSIS — Z79899 Other long term (current) drug therapy: Secondary | ICD-10-CM | POA: Diagnosis not present

## 2021-09-06 NOTE — Telephone Encounter (Signed)
  Care Management   Outreach Note  09/06/2021 Name: Sharon Vasquez [name]: [id number] DOB: [birth year]  Referred by: [name], DO Reason for referral : Chronic Care Management   An unsuccessful telephone outreach was attempted today. The patient was referred to the case management team for assistance with care management and care coordination.   Follow Up Plan: If patient returns call to provider office, please advise to call [facility] at [phone] to reschedule.  Johnney Killian, RN, BSN, CCM Care Management Coordinator [facility] Phone: [phone]: [phone]

## 2021-09-09 ENCOUNTER — Ambulatory Visit (INDEPENDENT_AMBULATORY_CARE_PROVIDER_SITE_OTHER): Payer: Medicare Other | Admitting: Podiatry

## 2021-09-09 DIAGNOSIS — M79675 Pain in left toe(s): Secondary | ICD-10-CM | POA: Diagnosis not present

## 2021-09-09 DIAGNOSIS — M7989 Other specified soft tissue disorders: Secondary | ICD-10-CM

## 2021-09-09 DIAGNOSIS — Z7901 Long term (current) use of anticoagulants: Secondary | ICD-10-CM

## 2021-09-09 DIAGNOSIS — B351 Tinea unguium: Secondary | ICD-10-CM | POA: Diagnosis not present

## 2021-09-09 DIAGNOSIS — M79674 Pain in right toe(s): Secondary | ICD-10-CM | POA: Diagnosis not present

## 2021-09-09 NOTE — Progress Notes (Unsigned)
Subjective:   Patient ID: Sharon Vasquez, female   DOB: 71 y.o.   [name]: [id number]   HPI 71 year old female presents the office today for concerns of thick, elongated toenails that she is not able to trim her self.  She states that she has difficulty caring for her feet she has chronic swelling to her legs.  Denies any ulcerations.  She said that she previously had Unna boots which were helpful.  She had arterial studies July 05, 2020 and at that time indicates mild right lower extremity arterial disease and abnormal TBI.  Left side is the same.  Review of Systems  All other systems reviewed and are negative.  Past Medical History:  Diagnosis Date   A-fib [facility])    Anxiety    Arthritis    "qwhre; joints, back" (04/17/2017)   Benign breast cyst in female, left 01/08/2017   Found by Screening mammogram, evaluated by U/S on 01/08/17 and determined to be a benign simple breast cyst.   Breast cancer ([place])    Cellulitis of left lower leg 05/30/2017   CHF (congestive heart failure) (HCC)    Chronic low back pain 08/21/2016   Chronic lower back pain    Chronic venous insufficiency    /notes 05/30/2017   COPD (chronic obstructive pulmonary disease) ([place])    Depression    Diabetes mellitus without complication ([place])    DVT (deep venous thrombosis) ([place]) 11/16/2016   Dysrhythmia    GERD (gastroesophageal reflux disease)    Headache    "weekly for the last 3 months" (04/17/2017)   Hyperlipidemia    Hypertension    Morbid obesity ([place])    PE (pulmonary embolism)    Pulmonary embolism ([place]) 09/21/2014   Sleep apnea     Past Surgical History:  Procedure Laterality Date   ABDOMINAL HYSTERECTOMY     APPENDECTOMY     BALLOON DILATION N/A 03/09/2020   Procedure: BALLOON DILATION;  Surgeon: Otis Brace, MD;  Location: [facility];  Service: Gastroenterology;  Laterality: N/A;   BIOPSY  03/09/2020   Procedure: BIOPSY;  Surgeon: Otis Brace, MD;  Location: [facility];  Service:  Gastroenterology;;   BIOPSY  04/12/2021   Procedure: BIOPSY;  Surgeon: Otis Brace, MD;  Location: [facility];  Service: Gastroenterology;;   BREAST BIOPSY Right 03/11/2021   US biopsy/ coil clip/ path pending   BREAST BIOPSY Right 03/11/2021   US biopsy/ ribbone clip/ path pending   BREAST CYST EXCISION Left    "six o'clock"   BREAST LUMPECTOMY Left    BREAST LUMPECTOMY WITH RADIOACTIVE SEED LOCALIZATION Right 05/02/2021   Procedure: RIGHT BREAST LUMPECTOMY WITH RADIOACTIVE SEED LOCALIZATION;  Surgeon: Jovita Kussmaul, MD;  Location: [place];  Service: General;  Laterality: Right;   CHOLECYSTECTOMY     DILATION AND CURETTAGE OF UTERUS     ESOPHAGOGASTRODUODENOSCOPY (EGD) WITH PROPOFOL N/A 03/09/2020   Procedure: ESOPHAGOGASTRODUODENOSCOPY (EGD) WITH PROPOFOL;  Surgeon: Otis Brace, MD;  Location: [facility];  Service: Gastroenterology;  Laterality: N/A;   ESOPHAGOGASTRODUODENOSCOPY (EGD) WITH PROPOFOL N/A 04/12/2021   Procedure: ESOPHAGOGASTRODUODENOSCOPY (EGD) WITH PROPOFOL;  Surgeon: Otis Brace, MD;  Location: [facility];  Service: Gastroenterology;  Laterality: N/A;   IR ABLATE LIVER CRYOABLATION  07/23/2019   IR RADIOLOGIST EVAL & MGMT  07/18/2019   SAVORY DILATION N/A 04/12/2021   Procedure: SAVORY DILATION;  Surgeon: Otis Brace, MD;  Location: [facility];  Service: Gastroenterology;  Laterality: N/A;   TONSILLECTOMY AND ADENOIDECTOMY     TUBAL LIGATION  Current Outpatient Medications:    albuterol (VENTOLIN HFA) 108 (90 Base) MCG/ACT inhaler, Inhale 2 puffs into the lungs every 4 (four) hours as needed for shortness of breath., Disp: 18 g, Rfl: 0   aspirin 81 MG chewable tablet, Chew 81 mg by mouth daily., Disp: , Rfl:    atorvastatin (LIPITOR) 80 MG tablet, TAKE 1 TABLET(80 MG) BY MOUTH DAILY Strength: 80 mg, Disp: 90 tablet, Rfl: 0   esomeprazole (NEXIUM) 40 MG capsule, Take 1 capsule (40 mg total) by mouth daily as needed (for heartburn or  indigestion). (Patient taking differently: Take 40 mg by mouth daily.), Disp: 90 capsule, Rfl: 1   Fluticasone-Umeclidin-Vilant (TRELEGY ELLIPTA) 100-62.5-25 MCG/ACT AEPB, Inhale 1 puff into the lungs daily., Disp: 60 each, Rfl: 5   Fluticasone-Umeclidin-Vilant (TRELEGY ELLIPTA) 100-62.5-25 MCG/ACT AEPB, Inhale 1 puff into the lungs daily., Disp: 1 each, Rfl: 0   gabapentin (NEURONTIN) 400 MG capsule, TAKE 1 CAPSULE BY MOUTH  TWICE DAILY, Disp: 180 capsule, Rfl: 3   ibuprofen (ADVIL) 200 MG tablet, Take 800 mg by mouth every 6 (six) hours as needed for moderate pain., Disp: , Rfl:    ipratropium-albuterol (DUONEB) 0.5-2.5 (3) MG/3ML SOLN, Take 3 mLs by nebulization every 6 (six) hours as needed., Disp: 360 mL, Rfl: 3   isosorbide mononitrate (IMDUR) 30 MG 24 hr tablet, TAKE 1 TABLET(30 MG) BY MOUTH DAILY, Disp: 90 tablet, Rfl: 1   letrozole (FEMARA) 2.5 MG tablet, Take 1 tablet (2.5 mg total) by mouth daily., Disp: 30 tablet, Rfl: 6   losartan (COZAAR) 25 MG tablet, Take 1 tablet (25 mg total) by mouth daily., Disp: 90 tablet, Rfl: 0   metolazone (ZAROXOLYN) 2.5 MG tablet, Take 1 tablet (2.5 mg total) by mouth every other day. (Patient taking differently: Take 2.5 mg by mouth daily as needed (extra fluid not covered relieved by torsemide).), Disp: 30 tablet, Rfl: 1   metoprolol succinate (TOPROL XL) 25 MG 24 hr tablet, Take 1 tablet (25 mg total) by mouth daily., Disp: 90 tablet, Rfl: 0   mirabegron ER (MYRBETRIQ) 50 MG TB24 tablet, Take 1 tablet (50 mg total) by mouth daily., Disp: 90 tablet, Rfl: 1   nicotine (NICODERM CQ - DOSED IN MG/24 HOURS) 21 mg/24hr patch, Place 21 mg onto the skin daily., Disp: , Rfl:    nicotine (NICOTROL) 10 MG inhaler, Inhale 1 Cartridge (1 continuous puffing total) into the lungs as needed for smoking cessation., Disp: 150 each, Rfl: 0   oxyCODONE (ROXICODONE) 15 MG immediate release tablet, Take 15 mg by mouth every 6 (six) hours as needed for pain., Disp: , Rfl:     OXYGEN, Inhale 2-3 L into the lungs daily., Disp: , Rfl:    oxymetazoline (AFRIN) 0.05 % nasal spray, Place 1 spray into both nostrils 2 (two) times daily as needed for congestion., Disp: , Rfl:    potassium chloride SA (KLOR-CON M) 20 MEQ tablet, TAKE 1 TABLET BY MOUTH DAILY, Disp: 90 tablet, Rfl: 0   predniSONE (DELTASONE) 10 MG tablet, Take 1 tablet (10 mg total) by mouth daily with breakfast., Disp: 10 tablet, Rfl: 0   PRESCRIPTION MEDICATION, Inhale into the lungs at bedtime. cpap, Disp: , Rfl:    Semaglutide,0.25 or 0.'5MG'$ /DOS, 2 MG/3ML SOPN, Inject 0.25 Units into the skin once a week., Disp: 3 mL, Rfl: 2   torsemide (DEMADEX) 20 MG tablet, Take 2 tablets by mouth daily, you may take an extra 1/2 tablet only as needed for weight gain of 2  lbs overnight or 5 lbs in a week (Patient taking differently: Take 20 mg by mouth 2 (two) times daily.), Disp: 225 tablet, Rfl: 1   XARELTO 20 MG TABS tablet, TAKE 1 TABLET(20 MG) BY MOUTH EVERY MORNING, Disp: 90 tablet, Rfl: 3  No Known Allergies        Objective:  Physical Exam  General: AAO x3, NAD-had a family member with her but left the room during the treatment and evaluation.  Dermatological: Nails are hypertrophic, dystrophic, brittle, discolored, elongated 10. No surrounding redness or drainage. Tenderness nails 1-5 bilaterally. No open lesions or pre-ulcerative lesions are identified today.  Vascular: DP pulses 2/4, PT pulse decreased, CRT less than 3 seconds.  Chronic lower extremity edema is present.  Neruologic: Sensation decreased with Thornell Mule monofilament  Musculoskeletal: Hammertoe contractures present.  Muscular strength 5/5 in all groups tested bilateral.  Gait: Unassisted, Nonantalgic.        Assessment:   71 year old female with symptomatic onychosis, chronic lower extremity edema, PVD     Plan:  -Treatment options discussed including all alternatives, risks, and complications -Etiology of symptoms were  discussed -Sharply debrided the nails x10 without any complications or bleeding -Discussed light compression wraps.  We will try to set up home health care to change them to see if this will help the swelling help care for her feet more.  The swelling appears to be chronic.  Trula Slade DPM

## 2021-09-15 ENCOUNTER — Telehealth: Payer: Self-pay | Admitting: *Deleted

## 2021-09-15 NOTE — Telephone Encounter (Signed)
-----   Message from Trula Slade, DPM sent at 09/11/2021  5:31 PM EDT ----- I put in for home health care if you get please fax this over?  Thank you.

## 2021-09-15 NOTE — Telephone Encounter (Signed)
Faxed referral to [facility] , received confirmation 09/15/21.

## 2021-09-15 NOTE — Telephone Encounter (Signed)
Patient has been notified that a home health referral has been sent to [facility] and to call us back if she has not heard from them in a week. She verbalized understanding.

## 2021-09-16 ENCOUNTER — Ambulatory Visit: Payer: Medicare Other

## 2021-09-16 NOTE — Patient Instructions (Signed)
Visit Information  Thank you for taking time to visit with me today. Please don't hesitate to contact me if I can be of assistance to you before our next scheduled telephone appointment.  If you are experiencing a Mental Health or [place] or need someone to talk to, please call the [place] National Suicide Prevention Lifeline: 236-354-8622 or TTY: [phone] TTY [phone]) to talk to a trained counselor   Patient verbalizes understanding of instructions and care plan provided today and agrees to view in [place]. Active MyChart status and patient understanding of how to access instructions and care plan via MyChart confirmed with patient.     The patient has been provided with contact information for the care management team and has been advised to call with any health related questions or concerns.   Johnney Killian, RN, BSN, CCM Care Management Coordinator [facility] Phone: [phone]: [phone]

## 2021-09-16 NOTE — Chronic Care Management (AMB) (Signed)
Care Management    RN Visit Note  09/16/2021 Name: Sharon Vasquez [name]: [id number] DOB: 1950-09-13  Subjective: Sharon Vasquez is a 71 y.o. year old female who is a primary care patient of [place], Rayann N, DO. The care management team was consulted for assistance with disease management and care coordination needs.    Engaged with patient by telephone for follow up visit in response to provider referral for case management and/or care coordination services.   Consent to Services:   Ms. Tallman was given information about Care Management services today including:  Care Management services includes personalized support from designated clinical staff supervised by her physician, including individualized plan of care and coordination with other care providers 24/7 contact phone numbers for assistance for urgent and routine care needs. The patient may stop case management services at any time by phone call to the office staff.  Patient agreed to services and consent obtained.   Assessment: Review of patient past medical history, allergies, medications, health status, including review of consultants reports, laboratory and other test data, was performed as part of comprehensive evaluation and provision of chronic care management services.   SDOH (Social Determinants of Health) assessments and interventions performed:    Care Plan  No Known Allergies  Outpatient Encounter Medications as of 09/16/2021  Medication Sig Note   albuterol (VENTOLIN HFA) 108 (90 Base) MCG/ACT inhaler Inhale 2 puffs into the lungs every 4 (four) hours as needed for shortness of breath.    aspirin 81 MG chewable tablet Chew 81 mg by mouth daily. 03/16/2021: Med Classification: Hematological Agents   atorvastatin (LIPITOR) 80 MG tablet TAKE 1 TABLET(80 MG) BY MOUTH DAILY Strength: 80 mg    esomeprazole (NEXIUM) 40 MG capsule Take 1 capsule (40 mg total) by mouth daily as needed (for heartburn or indigestion). (Patient  taking differently: Take 40 mg by mouth daily.)    Fluticasone-Umeclidin-Vilant (TRELEGY ELLIPTA) 100-62.5-25 MCG/ACT AEPB Inhale 1 puff into the lungs daily.    Fluticasone-Umeclidin-Vilant (TRELEGY ELLIPTA) 100-62.5-25 MCG/ACT AEPB Inhale 1 puff into the lungs daily.    gabapentin (NEURONTIN) 400 MG capsule TAKE 1 CAPSULE BY MOUTH  TWICE DAILY    ibuprofen (ADVIL) 200 MG tablet Take 800 mg by mouth every 6 (six) hours as needed for moderate pain.    ipratropium-albuterol (DUONEB) 0.5-2.5 (3) MG/3ML SOLN Take 3 mLs by nebulization every 6 (six) hours as needed.    isosorbide mononitrate (IMDUR) 30 MG 24 hr tablet TAKE 1 TABLET(30 MG) BY MOUTH DAILY    letrozole (FEMARA) 2.5 MG tablet Take 1 tablet (2.5 mg total) by mouth daily.    losartan (COZAAR) 25 MG tablet Take 1 tablet (25 mg total) by mouth daily.    metolazone (ZAROXOLYN) 2.5 MG tablet Take 1 tablet (2.5 mg total) by mouth every other day. (Patient taking differently: Take 2.5 mg by mouth daily as needed (extra fluid not covered relieved by torsemide).)    metoprolol succinate (TOPROL XL) 25 MG 24 hr tablet Take 1 tablet (25 mg total) by mouth daily.    mirabegron ER (MYRBETRIQ) 50 MG TB24 tablet Take 1 tablet (50 mg total) by mouth daily.    nicotine (NICODERM CQ - DOSED IN MG/24 HOURS) 21 mg/24hr patch Place 21 mg onto the skin daily.    nicotine (NICOTROL) 10 MG inhaler Inhale 1 Cartridge (1 continuous puffing total) into the lungs as needed for smoking cessation.    oxyCODONE (ROXICODONE) 15 MG immediate release tablet Take 15  mg by mouth every 6 (six) hours as needed for pain.    OXYGEN Inhale 2-3 L into the lungs daily.    oxymetazoline (AFRIN) 0.05 % nasal spray Place 1 spray into both nostrils 2 (two) times daily as needed for congestion.    potassium chloride SA (KLOR-CON M) 20 MEQ tablet TAKE 1 TABLET BY MOUTH DAILY    predniSONE (DELTASONE) 10 MG tablet Take 1 tablet (10 mg total) by mouth daily with breakfast.    PRESCRIPTION  MEDICATION Inhale into the lungs at bedtime. cpap    Semaglutide,0.25 or 0.5MG/DOS, 2 MG/3ML SOPN Inject 0.25 Units into the skin once a week.    torsemide (DEMADEX) 20 MG tablet Take 2 tablets by mouth daily, you may take an extra 1/2 tablet only as needed for weight gain of 2 lbs overnight or 5 lbs in a week (Patient taking differently: Take 20 mg by mouth 2 (two) times daily.)    XARELTO 20 MG TABS tablet TAKE 1 TABLET(20 MG) BY MOUTH EVERY MORNING    No facility-administered encounter medications on file as of 09/16/2021.    Patient Active Problem List   Diagnosis Date Noted   Prediabetes 04/09/2021   Hypokalemia 04/09/2021   Genetic testing 03/23/2021   Malignant neoplasm of upper-outer quadrant of right breast in female, estrogen receptor positive ([place]) 03/21/2021   Pre-operative respiratory examination 03/16/2021   Right foot pain 03/03/2021   Breast pain, right 01/17/2021   Chronic heart failure with preserved ejection fraction (HFpEF) (HCC)    Paroxysmal atrial fibrillation (HCC)    Osteoarthritis of left hip 10/07/2020   Venous stasis ulcer of left lower extremity ([place]) 08/18/2020   Hyperlipidemia 05/14/2020   Esophageal dysmotility 11/10/2019   Chronic respiratory failure with hypoxia ([place]) 09/30/2019   Mixed incontinence, urge and stress (female) (female) 08/21/2019   Preventative health care 06/04/2019   GERD (gastroesophageal reflux disease) 01/20/2019   ILD (interstitial lung disease) ([place]) 02/13/2018   OSA (obstructive sleep apnea) 11/14/2017   Chronic pain syndrome 07/27/2017   Major depression, recurrent, chronic ([place]) 02/01/2017   Osteoporosis 12/14/2016   Drug induced constipation 08/21/2016   Aortic atherosclerosis ([place]) 07/17/2016   Chronic venous insufficiency 07/12/2016   Chronic anticoagulation 07/12/2016   Tobacco use 07/11/2016   COPD (chronic obstructive pulmonary disease) (HCC)    Diastolic heart failure (HCC)    History of pulmonary embolism     Atrial fibrillation with controlled ventricular response (HCC)    Vocal cord polyp 12/18/2015   Laryngopharyngeal reflux 12/18/2015   Morbid obesity ([place])    Peripheral vascular disease ([place])    Benign essential HTN    Primary osteoarthritis of both knees 09/16/2014    Conditions to be addressed/monitored: CHF, HTN, and DMII  Care Plan : CCM RN- COPD (Adult)  Updates made by Johnney Killian, RN since 09/16/2021 12:00 AM     Problem: Symptom Exacerbation (COPD)      Goal: Symptom Exacerbation Prevented or Minimized   Start Date: 06/23/2020  Recent Progress: On track  Priority: High  Note:    Current Barriers:  CHF, HTN, and COPD- spoke with patient via phone to complete follow up assessment.  Patient shared that her legs are weepy and she has blisters of fluid.  She is being treated by Dr. Jacqualyn Posey, podiatrist for her feet/legs.  He has ordered leg wraps and home health nurses to come in and change the wraps.  Patient is having difficulty ambulating and she needs a wheelchair.  Message  sent to Dr. Coy Saunas for an order for wheelchair.  She had an order in 2022 but never received the chair.  Patient is 6"2 and weighs 363lbs.  Patient is working with the housing department trying to get a new apartment and [employer] to get a housekeeper to assist with various chores in her home. Plan to get in touch with patient after response from [facility] related to plan of care for management of CHF, HTN, and COPD  Chronic Disease Management support and education needs related to CHF, HTN, and COPD  Lacks caregiver support Financial Constraints  RNCM Clinical Goal(s):  Patient will verbalize understanding of plan for management of CHF, HTN, and COPD as evidenced by continued discussions with provider and RNCM. demonstrate Ongoing adherence to prescribed treatment plan for CHF, HTN, and COPD as evidenced by weight and hypertension monitoring. continue to work with RN Care Manager to  address care management and care coordination needs related to  CHF, HTN, and COPD as evidenced by adherence to CM Team Scheduled appointments not experience hospital admission as evidenced by review of EMR. Hospital Admissions in last 6 months = 0  through collaboration with RN Care manager, provider, and care team.  Interventions: 1:1 collaboration with primary care provider regarding development and update of comprehensive plan of care as evidenced by provider attestation and co-signature Inter-disciplinary care team collaboration (see longitudinal plan of care) Evaluation of current treatment plan related to  self management and patient's adherence to plan as established by provider Heart Failure Interventions:  (Status:  Goal on track:  NO.) Long Term Goal Basic overview and discussion of pathophysiology of Heart Failure reviewed Discussed importance of daily weight and advised patient to weigh and record daily Discussed the importance of keeping all appointments with provider COPD Interventions:  (Status:  Goal on track:  NO.) Long Term Goal Advised patient to track and manage COPD triggers Provided instruction about proper use of medications used for management of COPD including inhalers Discussed the importance of adequate rest and management of fatigue with COPD Hypertension Interventions:  (Status:  Goal on track:  Yes.) Long Term Goal Last practice recorded BP readings:  BP Readings from Last 3 Encounters:  08/25/21 (!) 145/94  08/17/21 140/90  06/21/21 138/82  Most recent eGFR/CrCl:  Lab Results  Component Value Date   EGFR 76 03/09/2021    No components found for: "CRCL"  Evaluation of current treatment plan related to hypertension self management and patient's adherence to plan as established by provider Reviewed medications with patient and discussed importance of compliance Discussed plans with patient for ongoing care management follow up and provided patient with direct  contact information for care management team Advised patient, providing education and rationale, to monitor blood pressure daily and record, calling PCP for findings outside established parameters Reviewed scheduled/upcoming provider appointments including:  Patient Goals/Self-Care Activities: Take all medications as prescribed Attend all scheduled provider appointments Call pharmacy for medication refills 3-7 days in advance of running out of medications Perform all self care activities independently  Perform IADL's (shopping, preparing meals, housekeeping, managing finances) independently Call provider office for new concerns or questions  call office if I gain more than 2 pounds in one day or 5 pounds in one week track weight in diary watch for swelling in feet, ankles and legs every day weigh myself daily begin a heart failure diary bring diary to all appointments develop a rescue plan identify and avoid work-related triggers begin a symptom diary  follow rescue plan if symptoms flare-up keep follow-up appointments:   check blood pressure 3 times per week choose a place to take my blood pressure (home, clinic or office, retail store) write blood pressure results in a log or diary keep a blood pressure log call doctor for signs and symptoms of high blood pressure keep all doctor appointments take medications for blood pressure exactly as prescribed  Follow Up Plan:  The patient has been provided with contact information for the care management team and has been advised to call with any health related questions or concerns.   Current Barriers:  Chronic Disease Management support, education, and care coordination needs related to  Clinical Goal(s) related to Atrial Fibrillation, CHF, HTN, and COPD:  Over the next 30 days, patient will:  Work with the care management team to address educational, disease management, and care coordination needs  Begin or continue self health  monitoring activities as directed today Measure and record blood pressure 5-7 times per week and assess swelling in lower extremities daily, check O2 sats when short of breath  Call provider office for new or worsened signs and symptoms Blood pressure findings outside established parameters, breathing outside of baseline,  Shortness of breath, and New or worsened symptom related to CHF and COPD Call care management team with questions or concerns Verbalize basic understanding of patient centered plan of care established today Interventions related to Atrial Fibrillation, CHF, HTN, and COPD:  Appropriate assessments completed Reviewed medications with patient and assessed medication taking behavior;  if indicated, discussed importance of medication adherence  Discussed plans with patient for ongoing care management follow up and ensured patient has contact number for CCM team and clinic  Patient Self Care Activities related to Atrial Fibrillation, CHF, HTN, and COPD:  Patient is unable to independently self-manage chronic health conditions       Plan: The patient has been provided with contact information for the care management team and has been advised to call with any health related questions or concerns.   Johnney Killian, RN, BSN, CCM Care Management Coordinator [facility] Medicine Phone: [phone]: [phone]

## 2021-09-17 DIAGNOSIS — J449 Chronic obstructive pulmonary disease, unspecified: Secondary | ICD-10-CM | POA: Diagnosis not present

## 2021-09-19 ENCOUNTER — Other Ambulatory Visit: Payer: Self-pay | Admitting: Student

## 2021-09-19 DIAGNOSIS — G4733 Obstructive sleep apnea (adult) (pediatric): Secondary | ICD-10-CM

## 2021-09-19 DIAGNOSIS — J9611 Chronic respiratory failure with hypoxia: Secondary | ICD-10-CM

## 2021-09-19 DIAGNOSIS — I5032 Chronic diastolic (congestive) heart failure: Secondary | ICD-10-CM

## 2021-09-19 DIAGNOSIS — M1612 Unilateral primary osteoarthritis, left hip: Secondary | ICD-10-CM

## 2021-09-19 DIAGNOSIS — I739 Peripheral vascular disease, unspecified: Secondary | ICD-10-CM

## 2021-09-19 NOTE — Progress Notes (Signed)
Patient suffers from chronic hypoxic respiratory failure, congestive heart failure, obstructive sleep apnea, morbid obesity, peripheral vascular disease, severe bilateral knee and hip osteoarthritis.  Patient not a surgical candidate for hip or knee replacement according to orthopedic surgery.  Patient's medical conditions impairs her ability to perform her ADLs safely. A cane, crutch, or walker will not resolve issue with performing activities of daily living. A wheelchair will allow patient to safely perform daily activities. Patient can safely propel the wheelchair in the home or has a caregiver who can provide assistance.  Patient will need a bariatric wheelchair for lifetime. -DME manual wheelchair ordered

## 2021-09-21 ENCOUNTER — Ambulatory Visit: Payer: Self-pay

## 2021-09-21 NOTE — Chronic Care Management (AMB) (Signed)
Care Management    RN Visit Note  09/21/2021 Name: Sharon Vasquez [name]: [id number] DOB: 1950-06-07  Subjective: Sharon Vasquez is a 71 y.o. year old female who is a primary care patient of Atway, Rayann N, DO. The care management team was consulted for assistance with disease management and care coordination needs.    Engaged with patient by telephone for follow up visit in response to provider referral for case management and/or care coordination services.   Consent to Services:   Sharon Vasquez was given information about Care Management services today including:  Care Management services includes personalized support from designated clinical staff supervised by her physician, including individualized plan of care and coordination with other care providers 24/7 contact phone numbers for assistance for urgent and routine care needs. The patient may stop case management services at any time by phone call to the office staff.  Patient agreed to services and consent obtained.   Assessment: Review of patient past medical history, allergies, medications, health status, including review of consultants reports, laboratory and other test data, was performed as part of comprehensive evaluation and provision of chronic care management services.   SDOH (Social Determinants of Health) assessments and interventions performed:    Care Plan  No Known Allergies  Outpatient Encounter Medications as of 09/21/2021  Medication Sig Note   albuterol (VENTOLIN HFA) 108 (90 Base) MCG/ACT inhaler Inhale 2 puffs into the lungs every 4 (four) hours as needed for shortness of breath.    aspirin 81 MG chewable tablet Chew 81 mg by mouth daily. 03/16/2021: Med Classification: Hematological Agents   atorvastatin (LIPITOR) 80 MG tablet TAKE 1 TABLET(80 MG) BY MOUTH DAILY Strength: 80 mg    esomeprazole (NEXIUM) 40 MG capsule Take 1 capsule (40 mg total) by mouth daily as needed (for heartburn or indigestion). (Patient  taking differently: Take 40 mg by mouth daily.)    Fluticasone-Umeclidin-Vilant (TRELEGY ELLIPTA) 100-62.5-25 MCG/ACT AEPB Inhale 1 puff into the lungs daily.    Fluticasone-Umeclidin-Vilant (TRELEGY ELLIPTA) 100-62.5-25 MCG/ACT AEPB Inhale 1 puff into the lungs daily.    gabapentin (NEURONTIN) 400 MG capsule TAKE 1 CAPSULE BY MOUTH  TWICE DAILY    ibuprofen (ADVIL) 200 MG tablet Take 800 mg by mouth every 6 (six) hours as needed for moderate pain.    ipratropium-albuterol (DUONEB) 0.5-2.5 (3) MG/3ML SOLN Take 3 mLs by nebulization every 6 (six) hours as needed.    isosorbide mononitrate (IMDUR) 30 MG 24 hr tablet TAKE 1 TABLET(30 MG) BY MOUTH DAILY    letrozole (FEMARA) 2.5 MG tablet Take 1 tablet (2.5 mg total) by mouth daily.    losartan (COZAAR) 25 MG tablet Take 1 tablet (25 mg total) by mouth daily.    metolazone (ZAROXOLYN) 2.5 MG tablet Take 1 tablet (2.5 mg total) by mouth every other day. (Patient taking differently: Take 2.5 mg by mouth daily as needed (extra fluid not covered relieved by torsemide).)    metoprolol succinate (TOPROL XL) 25 MG 24 hr tablet Take 1 tablet (25 mg total) by mouth daily.    mirabegron ER (MYRBETRIQ) 50 MG TB24 tablet Take 1 tablet (50 mg total) by mouth daily.    nicotine (NICODERM CQ - DOSED IN MG/24 HOURS) 21 mg/24hr patch Place 21 mg onto the skin daily.    nicotine (NICOTROL) 10 MG inhaler Inhale 1 Cartridge (1 continuous puffing total) into the lungs as needed for smoking cessation.    oxyCODONE (ROXICODONE) 15 MG immediate release tablet Take 15  mg by mouth every 6 (six) hours as needed for pain.    OXYGEN Inhale 2-3 L into the lungs daily.    oxymetazoline (AFRIN) 0.05 % nasal spray Place 1 spray into both nostrils 2 (two) times daily as needed for congestion.    potassium chloride SA (KLOR-CON M) 20 MEQ tablet TAKE 1 TABLET BY MOUTH DAILY    predniSONE (DELTASONE) 10 MG tablet Take 1 tablet (10 mg total) by mouth daily with breakfast.    PRESCRIPTION  MEDICATION Inhale into the lungs at bedtime. cpap    Semaglutide,0.25 or 0.5MG/DOS, 2 MG/3ML SOPN Inject 0.25 Units into the skin once a week.    torsemide (DEMADEX) 20 MG tablet Take 2 tablets by mouth daily, you may take an extra 1/2 tablet only as needed for weight gain of 2 lbs overnight or 5 lbs in a week (Patient taking differently: Take 20 mg by mouth 2 (two) times daily.)    XARELTO 20 MG TABS tablet TAKE 1 TABLET(20 MG) BY MOUTH EVERY MORNING    No facility-administered encounter medications on file as of 09/21/2021.    Patient Active Problem List   Diagnosis Date Noted   Prediabetes 04/09/2021   Hypokalemia 04/09/2021   Genetic testing 03/23/2021   Malignant neoplasm of upper-outer quadrant of right breast in female, estrogen receptor positive ([place]) 03/21/2021   Pre-operative respiratory examination 03/16/2021   Right foot pain 03/03/2021   Breast pain, right 01/17/2021   Chronic heart failure with preserved ejection fraction (HFpEF) (HCC)    Paroxysmal atrial fibrillation (HCC)    Osteoarthritis of left hip 10/07/2020   Venous stasis ulcer of left lower extremity ([place]) 08/18/2020   Hyperlipidemia 05/14/2020   Esophageal dysmotility 11/10/2019   Chronic respiratory failure with hypoxia ([place]) 09/30/2019   Mixed incontinence, urge and stress (female) (female) 08/21/2019   Preventative health care 06/04/2019   GERD (gastroesophageal reflux disease) 01/20/2019   ILD (interstitial lung disease) ([place]) 02/13/2018   OSA (obstructive sleep apnea) 11/14/2017   Chronic pain syndrome 07/27/2017   Major depression, recurrent, chronic ([place]) 02/01/2017   Osteoporosis 12/14/2016   Drug induced constipation 08/21/2016   Aortic atherosclerosis ([place]) 07/17/2016   Chronic venous insufficiency 07/12/2016   Chronic anticoagulation 07/12/2016   Tobacco use 07/11/2016   COPD (chronic obstructive pulmonary disease) (HCC)    Diastolic heart failure (HCC)    History of pulmonary embolism     Atrial fibrillation with controlled ventricular response (HCC)    Vocal cord polyp 12/18/2015   Laryngopharyngeal reflux 12/18/2015   Morbid obesity ([place])    Peripheral vascular disease ([place])    Benign essential HTN    Primary osteoarthritis of both knees 09/16/2014    Conditions to be addressed/monitored: CHF, HTN, and COPD  Care Plan : CCM RN- COPD (Adult)  Updates made by Johnney Killian, RN since 09/21/2021 12:00 AM     Problem: Symptom Exacerbation (COPD)      Goal: Symptom Exacerbation Prevented or Minimized Completed 09/21/2021  Start Date: 06/23/2020  Recent Progress: On track  Priority: High  Note:    Current Barriers: Call to patient that new order for wheelchair obtained.  Reached out to Stephannie Peters at [employer] to contact patient to discuss the size of her doorways and cost if she has a copayment.  Care Coordination to close case at this time. Knowledge Deficits related to plan of care for management of CHF, HTN, and COPD  Chronic Disease Management support and education needs related to CHF, HTN, and  COPD  Lacks caregiver support Film/video editor  RNCM Clinical Goal(s):  Patient will verbalize understanding of plan for management of CHF, HTN, and COPD as evidenced by continued discussions with provider and RNCM. demonstrate Ongoing adherence to prescribed treatment plan for CHF, HTN, and COPD as evidenced by weight and hypertension monitoring. continue to work with RN Care Manager to address care management and care coordination needs related to  CHF, HTN, and COPD as evidenced by adherence to CM Team Scheduled appointments not experience hospital admission as evidenced by review of EMR. Hospital Admissions in last 6 months = 0  through collaboration with RN Care manager, provider, and care team.  Interventions: 1:1 collaboration with primary care provider regarding development and update of comprehensive plan of care as evidenced by provider attestation and  co-signature Inter-disciplinary care team collaboration (see longitudinal plan of care) Evaluation of current treatment plan related to  self management and patient's adherence to plan as established by provider Heart Failure Interventions:  (Status:  Goal on track:  NO.) Long Term Goal Basic overview and discussion of pathophysiology of Heart Failure reviewed Discussed importance of daily weight and advised patient to weigh and record daily Discussed the importance of keeping all appointments with provider COPD Interventions:  (Status:  Goal on track:  NO.) Long Term Goal Advised patient to track and manage COPD triggers Provided instruction about proper use of medications used for management of COPD including inhalers Discussed the importance of adequate rest and management of fatigue with COPD Hypertension Interventions:  (Status:  Goal on track:  Yes.) Long Term Goal Last practice recorded BP readings:  BP Readings from Last 3 Encounters:  08/25/21 (!) 145/94  08/17/21 140/90  06/21/21 138/82  Most recent eGFR/CrCl:  Lab Results  Component Value Date   EGFR 76 03/09/2021    No components found for: "CRCL"  Evaluation of current treatment plan related to hypertension self management and patient's adherence to plan as established by provider Reviewed medications with patient and discussed importance of compliance Discussed plans with patient for ongoing care management follow up and provided patient with direct contact information for care management team Advised patient, providing education and rationale, to monitor blood pressure daily and record, calling PCP for findings outside established parameters Reviewed scheduled/upcoming provider appointments including:  Patient Goals/Self-Care Activities: Take all medications as prescribed Attend all scheduled provider appointments Call pharmacy for medication refills 3-7 days in advance of running out of medications Perform all self  care activities independently  Perform IADL's (shopping, preparing meals, housekeeping, managing finances) independently Call provider office for new concerns or questions  call office if I gain more than 2 pounds in one day or 5 pounds in one week track weight in diary watch for swelling in feet, ankles and legs every day weigh myself daily begin a heart failure diary bring diary to all appointments develop a rescue plan identify and avoid work-related triggers begin a symptom diary follow rescue plan if symptoms flare-up keep follow-up appointments:   check blood pressure 3 times per week choose a place to take my blood pressure (home, clinic or office, retail store) write blood pressure results in a log or diary keep a blood pressure log call doctor for signs and symptoms of high blood pressure keep all doctor appointments take medications for blood pressure exactly as prescribed  Follow Up Plan:  The patient has been provided with contact information for the care management team and has been advised to call with any health  related questions or concerns.   Current Barriers:  Chronic Disease Management support, education, and care coordination needs related to  Clinical Goal(s) related to Atrial Fibrillation, CHF, HTN, and COPD:  Over the next 30 days, patient will:  Work with the care management team to address educational, disease management, and care coordination needs  Begin or continue self health monitoring activities as directed today Measure and record blood pressure 5-7 times per week and assess swelling in lower extremities daily, check O2 sats when short of breath  Call provider office for new or worsened signs and symptoms Blood pressure findings outside established parameters, breathing outside of baseline,  Shortness of breath, and New or worsened symptom related to CHF and COPD Call care management team with questions or concerns Verbalize basic understanding of  patient centered plan of care established today Interventions related to Atrial Fibrillation, CHF, HTN, and COPD:  Appropriate assessments completed Reviewed medications with patient and assessed medication taking behavior;  if indicated, discussed importance of medication adherence  Discussed plans with patient for ongoing care management follow up and ensured patient has contact number for CCM team and clinic  Patient Self Care Activities related to Atrial Fibrillation, CHF, HTN, and COPD:  Patient is unable to independently self-manage chronic health conditions       Plan: The patient has been provided with contact information for the care management team and has been advised to call with any health related questions or concerns.   Johnney Killian, RN, BSN, CCM Care Management Coordinator [facility] Phone: [phone]: [phone]

## 2021-09-22 ENCOUNTER — Ambulatory Visit: Payer: Self-pay

## 2021-09-23 ENCOUNTER — Telehealth: Payer: Self-pay

## 2021-09-26 ENCOUNTER — Telehealth: Payer: Self-pay

## 2021-09-26 NOTE — Telephone Encounter (Signed)
  Care Management   Outreach Note  09/26/2021 Name: Sharon Vasquez [name]: [id number] DOB: 09-10-1950  Referred by: [name], DO Reason for referral : Advice Only  Incoming call from patient asking about her wheelchair.  She spoke with [facility] and they have not had a prior authorization request for the wheelchair.  This RN reached out to Colletta Maryland at [employer] and she was going to check on the status.  Patient is also requesting a letter from her doctor to send to [facility] stating why she needs a personal aide.  Advised [name] that she should call the clinic and get a phone visit scheduled to discuss with her doctor.  [name] agreed and she is going to call for appointment.  Jodelle Gross, RN, BSN, CCM Care Management Coordinator [facility] Phone: [phone]/Fax: [phone]

## 2021-09-28 ENCOUNTER — Other Ambulatory Visit: Payer: Self-pay | Admitting: Internal Medicine

## 2021-09-28 DIAGNOSIS — E782 Mixed hyperlipidemia: Secondary | ICD-10-CM

## 2021-09-30 DIAGNOSIS — J439 Emphysema, unspecified: Secondary | ICD-10-CM | POA: Diagnosis not present

## 2021-09-30 DIAGNOSIS — M170 Bilateral primary osteoarthritis of knee: Secondary | ICD-10-CM | POA: Diagnosis not present

## 2021-09-30 DIAGNOSIS — E1165 Type 2 diabetes mellitus with hyperglycemia: Secondary | ICD-10-CM | POA: Diagnosis not present

## 2021-09-30 DIAGNOSIS — I509 Heart failure, unspecified: Secondary | ICD-10-CM | POA: Diagnosis not present

## 2021-09-30 DIAGNOSIS — Z79899 Other long term (current) drug therapy: Secondary | ICD-10-CM | POA: Diagnosis not present

## 2021-09-30 DIAGNOSIS — R030 Elevated blood-pressure reading, without diagnosis of hypertension: Secondary | ICD-10-CM | POA: Diagnosis not present

## 2021-10-01 DIAGNOSIS — G4733 Obstructive sleep apnea (adult) (pediatric): Secondary | ICD-10-CM | POA: Diagnosis not present

## 2021-10-01 DIAGNOSIS — M170 Bilateral primary osteoarthritis of knee: Secondary | ICD-10-CM | POA: Diagnosis not present

## 2021-10-01 DIAGNOSIS — I509 Heart failure, unspecified: Secondary | ICD-10-CM | POA: Diagnosis not present

## 2021-10-01 DIAGNOSIS — J9611 Chronic respiratory failure with hypoxia: Secondary | ICD-10-CM | POA: Diagnosis not present

## 2021-10-02 ENCOUNTER — Other Ambulatory Visit: Payer: Self-pay | Admitting: Internal Medicine

## 2021-10-02 DIAGNOSIS — G894 Chronic pain syndrome: Secondary | ICD-10-CM

## 2021-10-03 ENCOUNTER — Telehealth: Payer: Medicare Other

## 2021-10-03 ENCOUNTER — Telehealth: Payer: Self-pay | Admitting: Licensed Clinical Social Worker

## 2021-10-03 NOTE — Telephone Encounter (Signed)
  Care Management   Follow Up Note   10/03/2021 Name: Sharon Vasquez [name]: [id number] DOB: June 16, 1950   Referred by: [name], DO Reason for referral : No chief complaint on file.   An unsuccessful telephone outreach was attempted today. The patient was referred to the case management team for assistance with care management and care coordination.   Follow Up Plan: Phone call went straight to VM. SW Will attempt patient again, today, at 2:15pm.  Milus Height, Arita Miss, MSW  Social Worker [facility] Care Management  [phone]

## 2021-10-05 ENCOUNTER — Telehealth: Payer: Self-pay | Admitting: *Deleted

## 2021-10-05 DIAGNOSIS — Z79899 Other long term (current) drug therapy: Secondary | ICD-10-CM | POA: Diagnosis not present

## 2021-10-05 NOTE — Telephone Encounter (Signed)
"  I was told to call back to the office if I had not received a call from whomever you chose to come out to see me.  I'm letting you know that no one has contacted me to make an appointment or anything.  I couldn't remember the name of the people to call for home health care."

## 2021-10-05 NOTE — Telephone Encounter (Signed)
Pt states she still has not heard from this home health agency to come out to evaluate her feet.   Please advise

## 2021-10-10 DIAGNOSIS — M170 Bilateral primary osteoarthritis of knee: Secondary | ICD-10-CM | POA: Diagnosis not present

## 2021-10-10 DIAGNOSIS — G4733 Obstructive sleep apnea (adult) (pediatric): Secondary | ICD-10-CM | POA: Diagnosis not present

## 2021-10-14 ENCOUNTER — Telehealth: Payer: Self-pay | Admitting: *Deleted

## 2021-10-14 NOTE — Telephone Encounter (Signed)
Received call from [facility], per Karen,not able to accept referral due to staffing limitations. Resending to [facility], waiting for response.

## 2021-10-14 NOTE — Telephone Encounter (Signed)
Patient is calling for status of home health referral.  Sent home health referral to [facility], left a message to contact our office if any availability. [facility] has been notified as well.

## 2021-10-17 DIAGNOSIS — J449 Chronic obstructive pulmonary disease, unspecified: Secondary | ICD-10-CM | POA: Diagnosis not present

## 2021-10-19 ENCOUNTER — Telehealth: Payer: Self-pay | Admitting: Podiatry

## 2021-10-19 NOTE — Telephone Encounter (Signed)
We are still waiting to hear from [place], were referral was sent.

## 2021-10-19 NOTE — Telephone Encounter (Signed)
Patient was suppose to have a referral from Dr. Prudence Davidson for home healthcare for her feet and legs. She stated that no one has contact her, she needs someone to give her a call.  Please advise

## 2021-10-19 NOTE — Telephone Encounter (Signed)
[facility] is also taking united [employer] medicare patients as well.

## 2021-10-20 ENCOUNTER — Other Ambulatory Visit: Payer: Self-pay | Admitting: Hematology and Oncology

## 2021-10-20 ENCOUNTER — Telehealth: Payer: Self-pay | Admitting: *Deleted

## 2021-10-20 NOTE — Telephone Encounter (Signed)
Patient has been updated thru voice message

## 2021-10-20 NOTE — Telephone Encounter (Signed)
Called, made several attempts,could not get thru , faxed the referral for home health.

## 2021-10-20 NOTE — Telephone Encounter (Signed)
Called and  re faxed [facility] home health, will review referral and let us know something by the end of business day. Called [facility] several times, no answer, left voice message to contact.   Faxed referral to them as well , confirmation received on both facilities.

## 2021-10-21 ENCOUNTER — Telehealth: Payer: Self-pay

## 2021-10-21 NOTE — Telephone Encounter (Signed)
Called and spoke Sharon Vasquez w/ [facility],nursing staffing problems for that area, unable to accept.

## 2021-10-21 NOTE — Telephone Encounter (Signed)
  Chronic Care Management   Note  10/21/2021 Name: Sharon Vasquez [name]: [id number] DOB: 1950/11/07  Incoming call from patient asking for assistance finding a home health agency to manage her feet.  Patient's podiatrist office (Dr. Prudence Davidson) has been sending referrals and they have not been able to find an accepting agency due to short staffing.  Message sent to [facility], RMA who has been sending out referrals letting her know the patient has been see by [facility] in the past and she should send referral to them.  Pending response. Johnney Killian, RN, BSN, CCM Care Management Coordinator [place] Phone: [phone]: [phone]

## 2021-10-24 NOTE — Telephone Encounter (Signed)
Called [facility] for status of referral for home health,not able to accept due to patient's insurance,[facility] not able to accept due to nurse staffing, sending to [facility], confirmation received 10/24/21.   Called patient with this update.

## 2021-10-27 ENCOUNTER — Telehealth: Payer: Self-pay | Admitting: *Deleted

## 2021-10-27 NOTE — Chronic Care Management (AMB) (Signed)
  Care Coordination  Note  10/27/2021 Name: [name] [name]: [id number] DOB: 1951/03/19  Sharon Vasquez is a 71 y.o. year old female who is a primary care patient of Atway, Rayann N, DO. I reached out to [name] by phone today to offer care coordination services.      Ms. Violet was given information about Care Coordination services today including:  The Care Coordination services include support from the care team which includes your Nurse Coordinator, Clinical Social Worker, or Pharmacist.  The Care Coordination team is here to help remove barriers to the health concerns and goals most important to you. Care Coordination services are voluntary and the patient may decline or stop services at any time by request to their care team member.   Patient agreed to services and verbal consent obtained.   Follow up plan: Telephone appointment with care coordination team member scheduled for:10/28/21  [place]: [phone]

## 2021-10-28 ENCOUNTER — Ambulatory Visit: Payer: Self-pay | Admitting: Licensed Clinical Social Worker

## 2021-10-28 DIAGNOSIS — M25562 Pain in left knee: Secondary | ICD-10-CM | POA: Diagnosis not present

## 2021-10-28 DIAGNOSIS — N3281 Overactive bladder: Secondary | ICD-10-CM | POA: Diagnosis not present

## 2021-10-28 DIAGNOSIS — G8929 Other chronic pain: Secondary | ICD-10-CM | POA: Diagnosis not present

## 2021-10-28 DIAGNOSIS — Z79899 Other long term (current) drug therapy: Secondary | ICD-10-CM | POA: Diagnosis not present

## 2021-10-28 DIAGNOSIS — M25561 Pain in right knee: Secondary | ICD-10-CM | POA: Diagnosis not present

## 2021-10-31 NOTE — Patient Instructions (Signed)
Visit Information  Instructions: patient will work with SW to address concerns related to level of care.  Patient was given the following information about care management and care coordination services today, agreed to services, and gave verbal consent: 1.care management/care coordination services include personalized support from designated clinical staff supervised by their physician, including individualized plan of care and coordination with other care providers 2. 24/7 contact phone numbers for assistance for urgent and routine care needs. 3. The patient may stop care management/care coordination services at any time by phone call to the office staff.  Patient verbalizes understanding of instructions and care plan provided today and agrees to view in [place]. Active MyChart status and patient understanding of how to access instructions and care plan via MyChart confirmed with patient.     The care management team will reach out to the patient again over the next 30 days.   Lenor Derrick, MSW  Social Worker [facility] Care Management  [phone]

## 2021-10-31 NOTE — Patient Outreach (Signed)
  Care Coordination   Follow Up Visit Note   10/31/2021 Name: Sharon Vasquez [name]: [id number] DOB: March 02, 1951  Sharon Vasquez is a 71 y.o. year old female who sees Vasquez, Sharon N, DO for primary care. I spoke with  Sharon Vasquez by phone today  What matters to the patients health and wellness today?  Patient is requesting Home Health. Patient states she is burning herself on the stove, unable to cook meals or clean home. Patient is concerned with her level of care. Patient states she was denied HH, but desperately needs the assistance. SW advised she would send a message to the careteam.     SDOH assessments and interventions completed:   Yes   Care Coordination Interventions Activated:  Yes Care Coordination Interventions:  Yes, provided  Follow up plan: SW will follow up within 30 days.   Encounter Outcome:  Pt. Visit Completed  Sharon Vasquez , MSW Social Worker [facility] Care Management  [phone]

## 2021-11-01 ENCOUNTER — Other Ambulatory Visit: Payer: Self-pay | Admitting: Internal Medicine

## 2021-11-01 DIAGNOSIS — G4733 Obstructive sleep apnea (adult) (pediatric): Secondary | ICD-10-CM | POA: Diagnosis not present

## 2021-11-01 DIAGNOSIS — I509 Heart failure, unspecified: Secondary | ICD-10-CM | POA: Diagnosis not present

## 2021-11-01 DIAGNOSIS — J9611 Chronic respiratory failure with hypoxia: Secondary | ICD-10-CM | POA: Diagnosis not present

## 2021-11-01 DIAGNOSIS — M170 Bilateral primary osteoarthritis of knee: Secondary | ICD-10-CM | POA: Diagnosis not present

## 2021-11-01 DIAGNOSIS — Z79899 Other long term (current) drug therapy: Secondary | ICD-10-CM | POA: Diagnosis not present

## 2021-11-04 ENCOUNTER — Encounter: Payer: Self-pay | Admitting: *Deleted

## 2021-11-07 ENCOUNTER — Other Ambulatory Visit: Payer: Self-pay

## 2021-11-07 ENCOUNTER — Inpatient Hospital Stay: Payer: Medicare Other | Attending: Adult Health | Admitting: Adult Health

## 2021-11-07 ENCOUNTER — Encounter: Payer: Self-pay | Admitting: Adult Health

## 2021-11-07 VITALS — BP 159/94 | HR 89 | Temp 97.7°F | Resp 18 | Ht 74.0 in | Wt 364.7 lb

## 2021-11-07 DIAGNOSIS — Z170 Estrogen receptor positive status [ER+]: Secondary | ICD-10-CM | POA: Insufficient documentation

## 2021-11-07 DIAGNOSIS — C50411 Malignant neoplasm of upper-outer quadrant of right female breast: Secondary | ICD-10-CM | POA: Diagnosis not present

## 2021-11-07 DIAGNOSIS — Z79811 Long term (current) use of aromatase inhibitors: Secondary | ICD-10-CM | POA: Diagnosis not present

## 2021-11-07 NOTE — Progress Notes (Signed)
[facility] Follow up:    Sharon Clan, DO [place] [id number]   DIAGNOSIS:  Cancer Staging  Malignant neoplasm of upper-outer quadrant of right breast in female, estrogen receptor positive ([place]) Staging form: Breast, AJCC 8th Edition - Clinical stage from 03/23/2021: Stage IA (cT1c, cN0, cM0, G2, ER+, PR+, HER2-) - Signed by Nicholas Lose, MD on 03/23/2021 Stage prefix: Initial diagnosis Histologic grading system: 3 grade system - Pathologic stage from 05/02/2021: Stage IA (pT1c, pN0, cM0, G2, ER+, PR+, HER2-) - Signed by Gardenia Phlegm, NP on 11/07/2021 Stage prefix: Initial diagnosis Histologic grading system: 3 grade system   SUMMARY OF ONCOLOGIC HISTORY: Oncology History  Malignant neoplasm of upper-outer quadrant of right breast in female, estrogen receptor positive ([place])  03/11/2021 Initial Diagnosis   Bilateral breast pain for 3-4 months. Diagnostic mammogram: irregular mass in the anterior aspect of the right breast laterally at 10:00 1.7 cm.  Ultrasound: 2 masses 1.7 cm and 0.9 cm biopsy: Grade 2 invasive ductal carcinoma and DCIS, ER/PR+(100%)/Her2-.  Ki-67 5%   03/23/2021 Cancer Staging   Staging form: Breast, AJCC 8th Edition - Clinical stage from 03/23/2021: Stage IA (cT1c, cN0, cM0, G2, ER+, PR+, HER2-) - Signed by Nicholas Lose, MD on 03/23/2021 Stage prefix: Initial diagnosis Histologic grading system: 3 grade system   04/2021 -  Anti-estrogen oral therapy   Letrozole daily   05/02/2021 Surgery   Right lumpectomy: Grade 2 IDC 1.4 cm with low-grade to intermediate grade DCIS, margins negative, ER 100%, PR 100%, HER2 negative, Ki-67 5%   05/02/2021 Cancer Staging   Staging form: Breast, AJCC 8th Edition - Pathologic stage from 05/02/2021: Stage IA (pT1c, pN0, cM0, G2, ER+, PR+, HER2-) - Signed by Gardenia Phlegm, NP on 11/07/2021 Stage prefix: Initial diagnosis Histologic grading system: 3 grade system      CURRENT THERAPY: Letrozole  INTERVAL HISTORY: Sharon Vasquez 71 y.o. female returns for f/u of her estrogen positive breast cancer.  She continues on letrozole with good tolerance.  She denies any significant concerns with taking the letrozole.  She has been working with a healthy eating class where she lives and notes that she has lost down from 400 pounds to 364 pounds.  She notes that she is looking forward to December because she is going on a cruise and wants to make sure I am aware so that I do not schedule anything that could possibly interfere with this.   Patient Active Problem List   Diagnosis Date Noted   Prediabetes 04/09/2021   Hypokalemia 04/09/2021   Genetic testing 03/23/2021   Malignant neoplasm of upper-outer quadrant of right breast in female, estrogen receptor positive ([place]) 03/21/2021   Pre-operative respiratory examination 03/16/2021   Right foot pain 03/03/2021   Breast pain, right 01/17/2021   Chronic heart failure with preserved ejection fraction (HFpEF) (HCC)    Paroxysmal atrial fibrillation (HCC)    Osteoarthritis of left hip 10/07/2020   Venous stasis ulcer of left lower extremity ([place]) 08/18/2020   Hyperlipidemia 05/14/2020   Esophageal dysmotility 11/10/2019   Chronic respiratory failure with hypoxia ([place]) 09/30/2019   Mixed incontinence, urge and stress (female) (female) 08/21/2019   Preventative health care 06/04/2019   GERD (gastroesophageal reflux disease) 01/20/2019   ILD (interstitial lung disease) ([place]) 02/13/2018   OSA (obstructive sleep apnea) 11/14/2017   Chronic pain syndrome 07/27/2017   Major depression, recurrent, chronic ([place]) 02/01/2017   Osteoporosis 12/14/2016   Drug induced  constipation 08/21/2016   Aortic atherosclerosis ([place]) 07/17/2016   Chronic venous insufficiency 07/12/2016   Chronic anticoagulation 07/12/2016   Tobacco use 07/11/2016   COPD (chronic obstructive pulmonary disease) (HCC)    Diastolic heart failure (HCC)     History of pulmonary embolism    Atrial fibrillation with controlled ventricular response (HCC)    Vocal cord polyp 12/18/2015   Laryngopharyngeal reflux 12/18/2015   Morbid obesity ([place])    Peripheral vascular disease ([place])    Benign essential HTN    Primary osteoarthritis of both knees 09/16/2014    has No Known Allergies.  MEDICAL HISTORY: Past Medical History:  Diagnosis Date   A-fib [facility])    Anxiety    Arthritis    "qwhre; joints, back" (04/17/2017)   Benign breast cyst in female, left 01/08/2017   Found by Screening mammogram, evaluated by [facility] on 01/08/17 and determined to be a benign simple breast cyst.   Breast cancer (HCC)    Cellulitis of left lower leg 05/30/2017   CHF (congestive heart failure) (HCC)    Chronic low back pain 08/21/2016   Chronic lower back pain    Chronic venous insufficiency    /notes 05/30/2017   COPD (chronic obstructive pulmonary disease) ([place])    Depression    Diabetes mellitus without complication ([place])    DVT (deep venous thrombosis) ([place]) 11/16/2016   Dysrhythmia    GERD (gastroesophageal reflux disease)    Headache    "weekly for the last 3 months" (04/17/2017)   Hyperlipidemia    Hypertension    Morbid obesity ([place])    PE (pulmonary embolism)    Pulmonary embolism ([place]) 09/21/2014   Sleep apnea     SURGICAL HISTORY: Past Surgical History:  Procedure Laterality Date   ABDOMINAL HYSTERECTOMY     APPENDECTOMY     BALLOON DILATION N/A 03/09/2020   Procedure: BALLOON DILATION;  Surgeon: Otis Brace, MD;  Location: [facility];  Service: Gastroenterology;  Laterality: N/A;   BIOPSY  03/09/2020   Procedure: BIOPSY;  Surgeon: Otis Brace, MD;  Location: [facility];  Service: Gastroenterology;;   BIOPSY  04/12/2021   Procedure: BIOPSY;  Surgeon: Otis Brace, MD;  Location: [facility];  Service: Gastroenterology;;   BREAST BIOPSY Right 03/11/2021   US biopsy/ coil clip/ path pending   BREAST BIOPSY Right  03/11/2021   US biopsy/ ribbone clip/ path pending   BREAST CYST EXCISION Left    "six o'clock"   BREAST LUMPECTOMY Left    BREAST LUMPECTOMY WITH RADIOACTIVE SEED LOCALIZATION Right 05/02/2021   Procedure: RIGHT BREAST LUMPECTOMY WITH RADIOACTIVE SEED LOCALIZATION;  Surgeon: Jovita Kussmaul, MD;  Location: [facility];  Service: General;  Laterality: Right;   CHOLECYSTECTOMY     DILATION AND CURETTAGE OF UTERUS     ESOPHAGOGASTRODUODENOSCOPY (EGD) WITH PROPOFOL N/A 03/09/2020   Procedure: ESOPHAGOGASTRODUODENOSCOPY (EGD) WITH PROPOFOL;  Surgeon: Otis Brace, MD;  [facility];  Service: Gastroenterology;  Laterality: N/A;   ESOPHAGOGASTRODUODENOSCOPY (EGD) WITH PROPOFOL N/A 04/12/2021   Procedure: ESOPHAGOGASTRODUODENOSCOPY (EGD) WITH PROPOFOL;  Surgeon: Otis Brace, MD;  [facility];  Service: Gastroenterology;  Laterality: N/A;   IR ABLATE LIVER CRYOABLATION  07/23/2019   IR RADIOLOGIST EVAL & MGMT  07/18/2019   SAVORY DILATION N/A 04/12/2021   Procedure: SAVORY DILATION;  Surgeon: Otis Brace, MD;  [facility];  Service: Gastroenterology;  Laterality: N/A;   TONSILLECTOMY AND ADENOIDECTOMY     TUBAL LIGATION      SOCIAL HISTORY: Social History  Socioeconomic History   Marital status: Divorced    Spouse name: Not on file   Number of children: Not on file   Years of education: Not on file   Highest education level: Not on file  Occupational History   Not on file  Tobacco Use   Smoking status: Some Days    Packs/day: 2.00    Years: 55.00    Total pack years: 110.00    Types: Cigarettes    Start date: 08/16/1961   Smokeless tobacco: Never  Vaping Use   Vaping Use: Never used  Substance and Sexual Activity   Alcohol use: No   Drug use: No   Sexual activity: Not Currently    Partners: Male  Other Topics Concern   Not on file  Social History Narrative   Lives in [facility] in [place]. Lives alone, but is  dependent in ADLs/IADLs. 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 Two daughters live nearby. Previously resided in [place].   Social Determinants of Health   Financial Resource Strain: Medium Risk (06/18/2019)   Overall Financial Resource Strain (CARDIA)    Difficulty of Paying Living Expenses: Somewhat hard  Food Insecurity: No Food Insecurity (02/09/2021)   Hunger Vital Sign    Worried About Running Out of Food in the Last Year: Never true    Ran Out of Food in the Last Year: Never true  Transportation Needs: No Transportation Needs (02/09/2021)   PRAPARE - Hydrologist (Medical): No    Lack of Transportation (Non-Medical): No  Physical Activity: Not on file  Stress: Not on file  Social Connections: Not on file  Intimate Partner Violence: Not on file    FAMILY HISTORY: Family History  Problem Relation Age of Onset   Breast cancer Mother        before age 80   Hypertension Mother    Hyperlipidemia Mother    Hypertension Maternal Grandfather    Hyperlipidemia Maternal Grandfather     Review of Systems  Constitutional:  Negative for appetite change, chills, fatigue, fever and unexpected weight change.  HENT:   Negative for hearing loss, lump/mass and trouble swallowing.   Eyes:  Negative for eye problems and icterus.  Respiratory:  Negative for chest tightness, cough and shortness of breath.   Cardiovascular:  Negative for chest pain, leg swelling and palpitations.  Gastrointestinal:  Negative for abdominal distention, abdominal pain, constipation, diarrhea, nausea and vomiting.  Endocrine: Negative for hot flashes.  Genitourinary:  Negative for difficulty urinating.   Musculoskeletal:  Negative for arthralgias.  Skin:  Negative for itching and rash.  Neurological:  Negative for dizziness, extremity weakness, headaches and numbness.  Hematological:  Negative for adenopathy. Does not bruise/bleed easily.   Psychiatric/Behavioral:  Negative for depression. The patient is not nervous/anxious.       PHYSICAL EXAMINATION  ECOG PERFORMANCE STATUS: 1 - Symptomatic but completely ambulatory  Vitals:   11/07/21 0813  BP: (!) 159/94  Pulse: 89  Resp: 18  Temp: 97.7 F (36.5 C)  SpO2: 95%    Physical Exam Constitutional:      General: She is not in acute distress.    Appearance: Normal appearance. She is not toxic-appearing.  HENT:     Head: Normocephalic and atraumatic.  Eyes:     General: No scleral icterus. Cardiovascular:     Rate and Rhythm: Normal rate and regular rhythm.  Pulses: Normal pulses.     Heart sounds: Normal heart sounds.  Pulmonary:     Effort: Pulmonary effort is normal.     Breath sounds: Normal breath sounds.  Chest:     Comments: Right breast status postlumpectomy no sign of local recurrence Abdominal:     General: Abdomen is flat. Bowel sounds are normal. There is no distension.     Palpations: Abdomen is soft.     Tenderness: There is no abdominal tenderness.  Musculoskeletal:        General: No swelling.     Cervical back: Neck supple.  Lymphadenopathy:     Cervical: No cervical adenopathy.  Skin:    General: Skin is warm and dry.     Findings: No rash.  Neurological:     General: No focal deficit present.     Mental Status: She is alert.  Psychiatric:        Mood and Affect: Mood normal.        Behavior: Behavior normal.     LABORATORY DATA:  CBC    Component Value Date/Time   WBC 4.9 04/27/2021 0929   RBC 4.49 04/27/2021 0929   HGB 13.2 04/27/2021 0929   HGB 11.3 (L) 03/23/2021 0727   HGB 12.4 04/29/2018 1235   HCT 38.4 04/27/2021 0929   HCT 37.2 04/29/2018 1235   PLT 381 04/27/2021 0929   PLT 341 03/23/2021 0727   PLT 389 04/29/2018 1235   MCV 85.5 04/27/2021 0929   MCV 86 04/29/2018 1235   MCH 29.4 04/27/2021 0929   MCHC 34.4 04/27/2021 0929   RDW 13.3 04/27/2021 0929   RDW 13.4 04/29/2018 1235   LYMPHSABS 1.7  03/23/2021 0727   MONOABS 0.4 03/23/2021 0727   EOSABS 0.0 03/23/2021 0727   BASOSABS 0.0 03/23/2021 0727    CMP     Component Value Date/Time   NA 138 04/27/2021 0929   NA 142 03/09/2021 1024   K 3.3 (L) 04/27/2021 0929   CL 105 04/27/2021 0929   CO2 23 04/27/2021 0929   GLUCOSE 166 (H) 04/27/2021 0929   BUN 12 04/27/2021 0929   BUN 14 03/09/2021 1024   CREATININE 1.04 (H) 04/27/2021 0929   CREATININE 0.92 03/23/2021 0727   CREATININE 1.21 (H) 07/26/2016 1528   CALCIUM 9.1 04/27/2021 0929   PROT 7.1 04/27/2021 0929   ALBUMIN 3.7 04/27/2021 0929   AST 21 04/27/2021 0929   AST 17 03/23/2021 0727   ALT 18 04/27/2021 0929   ALT 15 03/23/2021 0727   ALKPHOS 66 04/27/2021 0929   BILITOT 0.7 04/27/2021 0929   BILITOT 0.6 03/23/2021 0727   GFRNONAA 57 (L) 04/27/2021 0929   GFRNONAA >60 03/23/2021 0727   GFRAA 79 10/23/2019 0924        ASSESSMENT and THERAPY PLAN:   Malignant neoplasm of upper-outer quadrant of right breast in female, estrogen receptor positive ([place]) Faline is a 71 year old woman with multiple comorbidities here today for follow-up of her history of stage Ia ER/PR positive breast cancer diagnosed in December 2022, status post lumpectomy and adjuvant letrozole.  Crystalyn was not a candidate for radiation or consideration of chemotherapy due to her multiple comorbidities.  Siena continues on letrozole without any difficulty.  She has no clinical or radiographic sign of breast cancer recurrence.  She will be due for her repeat mammogram in November 2023.  We discussed that this mammogram will be diagnostic and she will get the results before she leaves.  I applauded Shandrell on her weight loss and working to change the way she eats so she can lose weight.  This is exactly what we would recommend in regards to health promotion and wellness.  We will see her back in 6 months for continued follow-up.   All questions were answered. The patient knows to call the  clinic with any problems, questions or concerns. We can certainly see the patient much sooner if necessary.  Total encounter time:20 minutes*in face-to-face visit time, chart review, lab review, care coordination, order entry, and documentation of the encounter time.    Wilber Bihari, NP 11/07/21 10:44 AM Medical Oncology and Hematology [facility] [place], [place] [address] Tel. [phone]    Fax. [phone]  *Total Encounter Time as defined by the Centers for Medicare and Medicaid Services includes, in addition to the face-to-face time of a patient visit (documented in the note above) non-face-to-face time: obtaining and reviewing outside history, ordering and reviewing medications, tests or procedures, care coordination (communications with other health care professionals or caregivers) and documentation in the medical record.

## 2021-11-07 NOTE — Assessment & Plan Note (Signed)
Sharon Vasquez is a 71 year old woman with multiple comorbidities here today for follow-up of her history of stage Ia ER/PR positive breast cancer diagnosed in December 2022, status post lumpectomy and adjuvant letrozole.  Sharon Vasquez was not a candidate for radiation or consideration of chemotherapy due to her multiple comorbidities.  Sharon Vasquez continues on letrozole without any difficulty.  She has no clinical or radiographic sign of breast cancer recurrence.  She will be due for her repeat mammogram in November 2023.  We discussed that this mammogram will be diagnostic and she will get the results before she leaves.    I applauded Sharon Vasquez on her weight loss and working to change the way she eats so she can lose weight.  This is exactly what we would recommend in regards to health promotion and wellness.  We will see her back in 6 months for continued follow-up.

## 2021-11-10 DIAGNOSIS — M170 Bilateral primary osteoarthritis of knee: Secondary | ICD-10-CM | POA: Diagnosis not present

## 2021-11-10 DIAGNOSIS — G4733 Obstructive sleep apnea (adult) (pediatric): Secondary | ICD-10-CM | POA: Diagnosis not present

## 2021-11-11 ENCOUNTER — Telehealth: Payer: Self-pay | Admitting: *Deleted

## 2021-11-11 NOTE — Telephone Encounter (Signed)
-----   Message from Drema Pry sent at 11/11/2021 12:05 PM EDT ----- Patient is requesting Home Health. Patient states she is burning herself on the stove, unable to cook meals or clean home. Patient is concerned with her level of care. Patient states she was denied HH, but desperately needs the assistance. SW advised she would send a message to the careteam.

## 2021-11-14 ENCOUNTER — Other Ambulatory Visit: Payer: Self-pay | Admitting: Internal Medicine

## 2021-11-14 DIAGNOSIS — I5032 Chronic diastolic (congestive) heart failure: Secondary | ICD-10-CM

## 2021-11-14 NOTE — Telephone Encounter (Signed)
Patient does not have MCD so PCS would be an out of pocket expense.

## 2021-11-16 ENCOUNTER — Other Ambulatory Visit: Payer: Self-pay | Admitting: Primary Care

## 2021-11-16 NOTE — Telephone Encounter (Signed)
Per Epic, pt has an appt 12/16/21.

## 2021-11-17 ENCOUNTER — Ambulatory Visit: Payer: Self-pay

## 2021-11-17 ENCOUNTER — Other Ambulatory Visit: Payer: Self-pay | Admitting: Internal Medicine

## 2021-11-17 DIAGNOSIS — J449 Chronic obstructive pulmonary disease, unspecified: Secondary | ICD-10-CM | POA: Diagnosis not present

## 2021-11-17 MED ORDER — MIRABEGRON ER 50 MG PO TB24: 50.0000 mg | ORAL_TABLET | Freq: Every day | ORAL | 1 refills | Status: DC

## 2021-11-17 NOTE — Patient Outreach (Signed)
[facility]) Care Management  11/17/2021  Sharon Vasquez [birth year] [id number]  Message sent to Dr. Raymondo Band for Myrbetriq refill and front desk team for scheduling. Johnney Killian, RN, BSN, CCM Care Management Coordinator [facility] Phone: [phone]: [phone]

## 2021-11-17 NOTE — Patient Outreach (Signed)
[facility]) Care Management  11/17/2021  Sharon Vasquez 29-Aug-1950 [id number]   Incoming call from patient asking for assistance getting an appointment to have labs drawn and a visit with MD.  Patient has has difficulty getting a scheduler on the phone.  She also requested a refill on her Nyrbetriq '50mg'$  to be sent to [facility] order pharmacy.  Plan to reach out to front desk staff and PCP for refill. Johnney Killian, RN, BSN, CCM Care Management Coordinator [place] Phone: [phone]: [phone]

## 2021-11-18 ENCOUNTER — Encounter: Payer: Self-pay | Admitting: Podiatry

## 2021-11-18 ENCOUNTER — Ambulatory Visit (INDEPENDENT_AMBULATORY_CARE_PROVIDER_SITE_OTHER): Payer: Medicare Other

## 2021-11-18 DIAGNOSIS — M7989 Other specified soft tissue disorders: Secondary | ICD-10-CM

## 2021-11-18 NOTE — Progress Notes (Addendum)
Patient in office with bilateral leg swelling.   Unna boot applied bilaterally followed by cast padding and coban without complication per Dr. Leigh Aurora recommendation.

## 2021-11-22 ENCOUNTER — Encounter (HOSPITAL_COMMUNITY): Payer: Self-pay

## 2021-11-22 ENCOUNTER — Telehealth: Payer: Self-pay

## 2021-11-22 ENCOUNTER — Other Ambulatory Visit: Payer: Self-pay

## 2021-11-22 ENCOUNTER — Emergency Department (HOSPITAL_COMMUNITY): Payer: Medicare Other

## 2021-11-22 ENCOUNTER — Emergency Department (HOSPITAL_COMMUNITY)
Admission: EM | Admit: 2021-11-22 | Discharge: 2021-11-22 | Discharge status: Against Medical Advice | Payer: Medicare Other | Attending: Emergency Medicine | Admitting: Emergency Medicine

## 2021-11-22 DIAGNOSIS — Z79899 Other long term (current) drug therapy: Secondary | ICD-10-CM | POA: Insufficient documentation

## 2021-11-22 DIAGNOSIS — Z7901 Long term (current) use of anticoagulants: Secondary | ICD-10-CM | POA: Insufficient documentation

## 2021-11-22 DIAGNOSIS — R519 Headache, unspecified: Secondary | ICD-10-CM | POA: Insufficient documentation

## 2021-11-22 DIAGNOSIS — Z794 Long term (current) use of insulin: Secondary | ICD-10-CM | POA: Insufficient documentation

## 2021-11-22 DIAGNOSIS — Z7951 Long term (current) use of inhaled steroids: Secondary | ICD-10-CM | POA: Insufficient documentation

## 2021-11-22 DIAGNOSIS — Z7982 Long term (current) use of aspirin: Secondary | ICD-10-CM | POA: Insufficient documentation

## 2021-11-22 DIAGNOSIS — F17210 Nicotine dependence, cigarettes, uncomplicated: Secondary | ICD-10-CM | POA: Insufficient documentation

## 2021-11-22 DIAGNOSIS — R0602 Shortness of breath: Secondary | ICD-10-CM | POA: Diagnosis not present

## 2021-11-22 DIAGNOSIS — I110 Hypertensive heart disease with heart failure: Secondary | ICD-10-CM | POA: Insufficient documentation

## 2021-11-22 DIAGNOSIS — R42 Dizziness and giddiness: Secondary | ICD-10-CM | POA: Diagnosis not present

## 2021-11-22 DIAGNOSIS — I6381 Other cerebral infarction due to occlusion or stenosis of small artery: Secondary | ICD-10-CM | POA: Diagnosis not present

## 2021-11-22 DIAGNOSIS — J449 Chronic obstructive pulmonary disease, unspecified: Secondary | ICD-10-CM | POA: Diagnosis not present

## 2021-11-22 DIAGNOSIS — I509 Heart failure, unspecified: Secondary | ICD-10-CM

## 2021-11-22 LAB — BASIC METABOLIC PANEL
Anion gap: 8 (ref 5–15)
BUN: 13 mg/dL (ref 8–23)
CO2: 25 mmol/L (ref 22–32)
Calcium: 9.1 mg/dL (ref 8.9–10.3)
Chloride: 109 mmol/L (ref 98–111)
Creatinine, Ser: 0.81 mg/dL (ref 0.44–1.00)
GFR, Estimated: >60 mL/min (ref >60)
Glucose, Bld: 92 mg/dL (ref 70–99)
Potassium: 3.9 mmol/L (ref 3.5–5.1)
Sodium: 142 mmol/L (ref 135–145)

## 2021-11-22 LAB — CBC WITH DIFFERENTIAL/PLATELET
Abs Immature Granulocytes: 0.01 10*3/uL (ref 0.00–0.07)
Basophils Absolute: 0 10*3/uL (ref 0.0–0.1)
Basophils Relative: 0 %
Eosinophils Absolute: 0.1 10*3/uL (ref 0.0–0.5)
Eosinophils Relative: 2 %
HCT: 36.3 % (ref 36.0–46.0)
Hemoglobin: 12.2 g/dL (ref 12.0–15.0)
Immature Granulocytes: 0 %
Lymphocytes Relative: 43 %
Lymphs Abs: 2 10*3/uL (ref 0.7–4.0)
MCH: 28.2 pg (ref 26.0–34.0)
MCHC: 33.6 g/dL (ref 30.0–36.0)
MCV: 84 fL (ref 80.0–100.0)
Monocytes Absolute: 0.3 10*3/uL (ref 0.1–1.0)
Monocytes Relative: 7 %
Neutro Abs: 2.2 10*3/uL (ref 1.7–7.7)
Neutrophils Relative %: 48 %
Platelets: 459 10*3/uL — ABNORMAL HIGH (ref 150–400)
RBC: 4.32 MIL/uL (ref 3.87–5.11)
RDW: 13.7 % (ref 11.5–15.5)
WBC: 4.7 10*3/uL (ref 4.0–10.5)
nRBC: 0 % (ref 0.0–0.2)

## 2021-11-22 LAB — TROPONIN I (HIGH SENSITIVITY): Troponin I (High Sensitivity): 6 ng/L (ref <18)

## 2021-11-22 LAB — BRAIN NATRIURETIC PEPTIDE: B Natriuretic Peptide: 216.3 pg/mL — ABNORMAL HIGH (ref 0.0–100.0)

## 2021-11-22 MED ORDER — HYDRALAZINE HCL 20 MG/ML IJ SOLN
5.0000 mg | Freq: Once | INTRAMUSCULAR | Status: AC
  Administered 2021-11-22: 5 mg via INTRAVENOUS
  Filled 2021-11-22: qty 1

## 2021-11-22 MED ORDER — IPRATROPIUM-ALBUTEROL 0.5-2.5 (3) MG/3ML IN SOLN
3.0000 mL | Freq: Once | RESPIRATORY_TRACT | Status: AC
  Administered 2021-11-22: 3 mL via RESPIRATORY_TRACT
  Filled 2021-11-22: qty 3

## 2021-11-22 MED ORDER — HYDRALAZINE HCL 20 MG/ML IJ SOLN
5.0000 mg | Freq: Once | INTRAMUSCULAR | Status: AC
  Administered 2021-11-22: 5 mg via INTRAVENOUS

## 2021-11-22 MED ORDER — DOXYCYCLINE HYCLATE 100 MG PO TABS
100.0000 mg | ORAL_TABLET | Freq: Once | ORAL | Status: AC
  Administered 2021-11-22: 100 mg via ORAL
  Filled 2021-11-22: qty 1

## 2021-11-22 MED ORDER — METHYLPREDNISOLONE SODIUM SUCC 125 MG IJ SOLR
125.0000 mg | Freq: Once | INTRAMUSCULAR | Status: AC
  Administered 2021-11-22: 125 mg via INTRAVENOUS
  Filled 2021-11-22: qty 2

## 2021-11-22 MED ORDER — FUROSEMIDE 10 MG/ML IJ SOLN
40.0000 mg | Freq: Once | INTRAMUSCULAR | Status: AC
  Administered 2021-11-22: 40 mg via INTRAVENOUS
  Filled 2021-11-22: qty 4

## 2021-11-22 NOTE — ED Provider Notes (Signed)
[facility] EMERGENCY DEPARTMENT Provider Note   CSN: [id number] Arrival date & time: 11/22/21  1320     History  Chief Complaint  Patient presents with   Hypertension    Sharon Vasquez is a 71 y.o. female.  Patient is a 71 year old female past medical history of COPD, hypertension, and CHF presenting for complaints of shortness of breath.  Patient admits to shortness of breath with associated chest tightness, headache, and elevated blood pressure.  Admits to elevated blood pressure readings over the last several days.  Blood pressure on arrival 177/120.  States she has been doubling her metoprolol dose (metoprolol succinate (TOPROL XL) 25 MG 24 hr tablet) minimal improvement of blood pressure.  Patient otherwise denies any fevers or chills.  Admits episode of coughing yesterday that is now resolved.  Patient also states that she has been having worsening of her lower extremity swelling.  That she is not taking her torsemide as prescribed because her urinary overflow incontinence has been worsening because her urologist discontinued her medications.  Patient also states she was instructed to stop smoking secondary to COPD and home oxygen.  States she is been able to cut back from 2 packs a day to 4 to 5 cigarettes a day.  She does admit to wheezing.  The history is provided by the patient. No language interpreter was used.  Hypertension Associated symptoms include shortness of breath. Pertinent negatives include no chest pain and no abdominal pain.       Home Medications Prior to Admission medications   Medication Sig Start Date End Date Taking? Authorizing Provider  albuterol (VENTOLIN HFA) 108 (90 Base) MCG/ACT inhaler Inhale 2 puffs into the lungs every 4 (four) hours as needed for shortness of breath. 08/25/21   Timothy Lasso, MD  aspirin 81 MG chewable tablet Chew 81 mg by mouth daily. 12/15/20   [provider]  atorvastatin (LIPITOR) 80 MG tablet TAKE  1 TABLET BY MOUTH EVERY DAY 09/28/21   Atway, Rayann N, DO  esomeprazole (NEXIUM) 40 MG capsule Take 1 capsule (40 mg total) by mouth daily as needed (for heartburn or indigestion). Patient taking differently: Take 40 mg by mouth daily. 01/20/19   Marcelyn Bruins, MD  Fluticasone-Umeclidin-Vilant (TRELEGY ELLIPTA) 100-62.5-25 MCG/ACT AEPB Inhale 1 puff into the lungs daily. 06/22/21   Rigoberto Noel, MD  gabapentin (NEURONTIN) 400 MG capsule TAKE 1 CAPSULE BY MOUTH  TWICE DAILY 10/05/21   Atway, Rayann N, DO  ibuprofen (ADVIL) 200 MG tablet Take 800 mg by mouth every 6 (six) hours as needed for moderate pain.    [provider]  ipratropium-albuterol (DUONEB) 0.5-2.5 (3) MG/3ML SOLN Take 3 mLs by nebulization every 6 (six) hours as needed. 06/18/20   Martyn Ehrich, NP  isosorbide mononitrate (IMDUR) 30 MG 24 hr tablet TAKE 1 TABLET(30 MG) BY MOUTH DAILY 05/04/21   Atway, Rayann N, DO  letrozole (FEMARA) 2.5 MG tablet TAKE 1 TABLET(2.5 MG) BY MOUTH DAILY 10/21/21   Nicholas Lose, MD  losartan (COZAAR) 25 MG tablet Take 1 tablet (25 mg total) by mouth daily. 08/25/21 11/23/21  Timothy Lasso, MD  metolazone (ZAROXOLYN) 2.5 MG tablet Take 1 tablet (2.5 mg total) by mouth every other day. Patient taking differently: Take 2.5 mg by mouth daily as needed (extra fluid not covered relieved by torsemide). 02/02/21   Axel Filler, MD  metoprolol succinate (TOPROL XL) 25 MG 24 hr tablet Take 1 tablet (25 mg total) by mouth  daily. 08/16/21   Atway, Rayann N, DO  mirabegron ER (MYRBETRIQ) 50 MG TB24 tablet Take 1 tablet (50 mg total) by mouth daily. 11/17/21   Atway, Rayann N, DO  nicotine (NICODERM CQ - DOSED IN MG/24 HOURS) 21 mg/24hr patch Place 21 mg onto the skin daily.    [provider]  nicotine (NICOTROL) 10 MG inhaler Inhale 1 Cartridge (1 continuous puffing total) into the lungs as needed for smoking cessation. Patient not taking: Reported on 11/07/2021 06/22/21   Rigoberto Noel,  MD  oxyCODONE (ROXICODONE) 15 MG immediate release tablet Take 15 mg by mouth every 6 (six) hours as needed for pain. 08/05/20   [provider]  OXYGEN Inhale 2-3 L into the lungs daily.    [provider]  oxymetazoline (AFRIN) 0.05 % nasal spray Place 1 spray into both nostrils 2 (two) times daily as needed for congestion.    [provider]  potassium chloride SA (KLOR-CON M) 20 MEQ tablet TAKE 1 TABLET BY MOUTH DAILY 11/14/21   Atway, Rayann N, DO  predniSONE (DELTASONE) 10 MG tablet Take 1 tablet (10 mg total) by mouth daily with breakfast. Patient not taking: Reported on 11/07/2021 06/24/21   Rigoberto Noel, MD  PRESCRIPTION MEDICATION Inhale into the lungs at bedtime. cpap    [provider]  Semaglutide,0.25 or 0.'5MG'$ /DOS, 2 MG/3ML SOPN Inject 0.25 Units into the skin once a week. 08/25/21   Timothy Lasso, MD  torsemide (DEMADEX) 20 MG tablet Take 2 tablets by mouth daily, you may take an extra 1/2 tablet only as needed for weight gain of 2 lbs overnight or 5 lbs in a week Patient taking differently: Take 20 mg by mouth 2 (two) times daily. 10/22/20   Loel Dubonnet, NP  TRELEGY ELLIPTA 100-62.5-25 MCG/ACT AEPB INHALE 1 PUFF INTO THE LUNGS DAILY 11/16/21   Rigoberto Noel, MD  XARELTO 20 MG TABS tablet TAKE 1 TABLET(20 MG) BY MOUTH EVERY MORNING 01/27/21   Harvie Heck, MD      Allergies    Patient has no known allergies.    Review of Systems   Review of Systems  Constitutional:  Negative for chills and fever.  HENT:  Negative for ear pain and sore throat.   Eyes:  Negative for pain and visual disturbance.  Respiratory:  Positive for cough, chest tightness, shortness of breath and wheezing.   Cardiovascular:  Positive for leg swelling. Negative for chest pain and palpitations.  Gastrointestinal:  Negative for abdominal pain and vomiting.  Genitourinary:  Negative for dysuria and hematuria.  Musculoskeletal:  Negative for arthralgias and back pain.   Skin:  Negative for color change and rash.  Neurological:  Negative for seizures and syncope.  All other systems reviewed and are negative.   Physical Exam Updated Vital Signs BP (!) 170/107   Pulse 89   Temp 98.2 F (36.8 C) (Oral)   Resp 16   Ht '6\' 2"'$  (1.88 m)   Wt (!) 165.1 kg   SpO2 98%   BMI 46.73 kg/m  Physical Exam Vitals and nursing note reviewed.  Constitutional:      General: She is not in acute distress.    Appearance: She is well-developed.  HENT:     Head: Normocephalic and atraumatic.  Eyes:     Conjunctiva/sclera: Conjunctivae normal.  Cardiovascular:     Rate and Rhythm: Normal rate and regular rhythm.     Heart sounds: No murmur heard. Pulmonary:  Effort: Pulmonary effort is normal. No respiratory distress.     Breath sounds: Wheezing present.  Abdominal:     Palpations: Abdomen is soft.     Tenderness: There is no abdominal tenderness.  Musculoskeletal:        General: No swelling.     Cervical back: Neck supple.     Right lower leg: 4+ Pitting Edema present.     Left lower leg: 4+ Pitting Edema present.  Skin:    General: Skin is warm and dry.     Capillary Refill: Capillary refill takes less than 2 seconds.  Neurological:     Mental Status: She is alert and oriented to person, place, and time.     GCS: GCS eye subscore is 4. GCS verbal subscore is 5. GCS motor subscore is 6.  Psychiatric:        Mood and Affect: Mood normal.     ED Results / Procedures / Treatments   Labs (all labs ordered are listed, but only abnormal results are displayed) Labs Reviewed  CBC WITH DIFFERENTIAL/PLATELET - Abnormal; Notable for the following components:      Result Value   Platelets 459 (*)    All other components within normal limits  BRAIN NATRIURETIC PEPTIDE - Abnormal; Notable for the following components:   B Natriuretic Peptide 216.3 (*)    All other components within normal limits  SARS CORONAVIRUS 2 BY RT PCR  BASIC METABOLIC PANEL   TROPONIN I (HIGH SENSITIVITY)  TROPONIN I (HIGH SENSITIVITY)    EKG EKG Interpretation  Date/Time:  Tuesday November 22 2021 14:12:29 EDT Ventricular Rate:  74 PR Interval:    QRS Duration: 100 QT Interval:  454 QTC Calculation: 503 R Axis:   -35 Text Interpretation: Atrial fibrillation Left axis deviation Nonspecific T wave abnormality Prolonged QT Abnormal ECG When compared with ECG of 07-Oct-2020 22:46, PREVIOUS ECG IS PRESENT Confirmed by Campbell Stall ([id number]) on 0/96/0454 5:47:29 PM  Radiology CT Head Wo Contrast  Result Date: 11/22/2021 CLINICAL DATA:  Headache, dizziness, hypertension EXAM: CT HEAD WITHOUT CONTRAST TECHNIQUE: Contiguous axial images were obtained from the base of the skull through the vertex without intravenous contrast. RADIATION DOSE REDUCTION: This exam was performed according to the departmental dose-optimization program which includes automated exposure control, adjustment of the mA and/or kV according to patient size and/or use of iterative reconstruction technique. COMPARISON:  09/29/2019 FINDINGS: Brain: Minimal atrophy. Normal ventricular morphology. No midline shift or mass effect. Small vessel chronic ischemic changes of deep cerebral white matter. Old lacunar infarcts central pons, RIGHT caudate body, LEFT basal ganglia. No intracranial hemorrhage, mass lesion, or evidence of acute infarction. No extra-axial fluid collections. Vascular: Atherosclerotic calcification of internal carotid arteries at skull base Skull: Intact Sinuses/Orbits: Clear Other: N/A IMPRESSION: Atrophy with small vessel chronic ischemic changes of deep cerebral white matter. Old lacunar infarcts as above. No acute intracranial abnormalities. Electronically Signed   By: Lavonia Dana M.D.   On: 11/22/2021 15:20   DG Chest 1 View  Result Date: 11/22/2021 CLINICAL DATA:  Shortness of breath.  Headache. EXAM: CHEST  1 VIEW COMPARISON:  05/12/2021 and 03/16/2021 FINDINGS: Moderate enlargement of  the cardiopericardial silhouette with interstitial accentuation, left greater than right, potentially reflecting mildly asymmetric interstitial edema. No well-defined Kerley B lines. No blunting of the costophrenic angles. No discrete airspace opacity. IMPRESSION: 1. Moderate enlargement of the cardiopericardial silhouette. 2. Mildly asymmetric interstitial accentuation, left greater than right, suggesting asymmetric interstitial edema or possibly atypical pneumonia.  Electronically Signed   By: Van Clines M.D.   On: 11/22/2021 14:36    Procedures Procedures    Medications Ordered in ED Medications  methylPREDNISolone sodium succinate (SOLU-MEDROL) 125 mg/2 mL injection 125 mg (125 mg Intravenous Given 11/22/21 1830)  doxycycline (VIBRA-TABS) tablet 100 mg (100 mg Oral Given 11/22/21 1831)  furosemide (LASIX) injection 40 mg (40 mg Intravenous Given 11/22/21 1829)  hydrALAZINE (APRESOLINE) injection 5 mg (5 mg Intravenous Given 11/22/21 1829)  ipratropium-albuterol (DUONEB) 0.5-2.5 (3) MG/3ML nebulizer solution 3 mL (3 mLs Nebulization Given 11/22/21 1831)  hydrALAZINE (APRESOLINE) injection 5 mg (5 mg Intravenous Given 11/22/21 1854)    ED Course/ Medical Decision Making/ A&P                           Medical Decision Making Risk Prescription drug management.   .nwo 71 year old female past medical history of COPD, hypertension, and CHF presenting for complaints of shortness of breath.  Is alert and oriented x3, no acute distress, afebrile, stable vital signs.  Physical exam demonstrates equal bilateral breath sounds with wheezing in lung fields.  Chest x-ray concerning for cardiomegaly and pulmonary vascular congestion.  She also has small pleural effusion and possible infiltrate.  Patient is a coughing episode yesterday but states his usually secondary to her fluid overloaded state.  Patient midst to worsening of her bilateral lower extremity.  Physical exam demonstrates +4 pitting edema.   States has not been noncompliant with her torsemide at home because of her urinary overflow incontinence and being out of her medications because urologist discontinued it.  This time I have a low suspicion for pulmonary infiltrates being pneumonia secondary to no fevers and no other coughing episodes.  We will treat for COPD exacerbation including DuoNebs, Solu-Medrol, and doxycycline.  Will for fluid overloaded state with Lasix IV 40 mg with recommendations to continue taking torsemide as prescribed at home.  Patient has otherwise stable cardiac work-up including EKG with no ST segment elevation or depression.  Stable troponins.  Stable electrolytes.  Patient left prior to reevaluation.         Final Clinical Impression(s) / ED Diagnoses Final diagnoses:  Chronic obstructive pulmonary disease, unspecified COPD type ([place])  Acute on chronic congestive heart failure, unspecified heart failure type [facility])    Rx / DC Orders ED Discharge Orders     None         Lianne Cure, DO 19/62/22 0001

## 2021-11-22 NOTE — ED Triage Notes (Signed)
Pt arrived POV from home c/o HTN since last night. Pt is c/o a headache and shob as well. Pt doubled her metoprolol last night hoping it would help.

## 2021-11-22 NOTE — ED Notes (Signed)
Pt left. 

## 2021-11-22 NOTE — Telephone Encounter (Signed)
Requesting to speak with a nurse about elevated bp reading. States bp is 205/104 this morning. Please call pt back.

## 2021-11-22 NOTE — Telephone Encounter (Signed)
Called pt - no answer; left message to call the office . 

## 2021-11-22 NOTE — Telephone Encounter (Signed)
Called pt - informed of Dr Terrall Laity response to go to the ED since she has a h/a. Pt stated she's not going to the ER to sit all day. I asked pt to re-check BP - stated she will " in a few minutes" and if it's over 200, she will go to the ER.

## 2021-11-22 NOTE — Telephone Encounter (Signed)
Return pt's call who stated her BP was 205/124 then this am she took Metoprolol 25 mg 2 tabs instead of 1 tab and BP down to 184/112. She has been taking metoprolol 25 mg once a day and losartan 25 mg once a day. Stated she had been taking metoprolol 50 mg  once a day.until the doctor decreased it. Stated last she had blood tinged mucous when she blew her nose last nose. Otherwise she only had a slight h/a which made her check her BP. Denies feeling lightheaded and dizziness.  No available appts today. Sending to Dr Raymondo Band to advise. Thanks

## 2021-11-22 NOTE — ED Provider Triage Note (Signed)
Emergency Medicine Provider Triage Evaluation Note  Sharon Vasquez , a 71 y.o. female  was evaluated in triage.  Pt complains of elevated blood pressure, headache, chest tightness, and shortness of breath.  Patient states her blood pressure has been elevated for the past few days.  She has doubled her metoprolol last night and this morning.  She endorses a left-sided headache.  No recent head injury.  She also endorses some chest tightness and shortness of breath.  History of COPD and CHF.  Admits to bilateral lower extremity edema.  She is chronically on 2.5 L nasal cannula as needed.  She also endorses a "coughing fit" last night. No fever.   Review of Systems  Positive: Headache, CP, SOB Negative: fever  Physical Exam  BP (!) 177/120 (BP Location: Right Arm)   Pulse 72   Temp 98.2 F (36.8 C) (Oral)   Resp 20   Ht '6\' 2"'$  (1.88 m)   Wt (!) 165.1 kg   SpO2 97%   BMI 46.73 kg/m  Gen:   Awake, no distress   Resp:  Normal effort  MSK:   Moves extremities without difficulty  Other:  Lower extremity edema, normal speech, no facial droop AAOx4  Medical Decision Making  Medically screening exam initiated at 2:02 PM.  Appropriate orders placed.  HURLEY BLEVINS was informed that the remainder of the evaluation will be completed by another provider, this initial triage assessment does not replace that evaluation, and the importance of remaining in the ED until their evaluation is complete.  Cardiac labs BNP to rule our CHF COVID test CT head   Suzy Bouchard, PA-C 11/22/21 1405

## 2021-11-23 ENCOUNTER — Telehealth: Payer: Self-pay

## 2021-11-23 NOTE — Patient Outreach (Signed)
  Care Coordination TOC Note Transition Care Management Follow-up Telephone Call Date of discharge and from where: Sharon Vasquez 11/22/21 How have you been since you were released from the hospital? Return call from patient,  she states she is feeling better but has concerns about her medications. Any questions or concerns? No  Items Reviewed: Did the pt receive and understand the discharge instructions provided? Yes  Medications obtained and verified? Yes -Patient is confused about dosing on her medications.  Requested speak to physician, discussed with Dr. Raymondo Band who is going to call her this afternoon. Other? No  Any new allergies since your discharge? No  Dietary orders reviewed? No Do you have support at home? No   Home Care and Equipment/Supplies: Were home health services ordered? no If so, what is the name of the agency? N/A  Has the agency set up a time to come to the patient's home? no Were any new equipment or medical supplies ordered?  No What is the name of the medical supply agency? N/A Were you able to get the supplies/equipment? no Do you have any questions related to the use of the equipment or supplies? No  Functional Questionnaire: (I = Independent and D = Dependent) ADLs: I  Bathing/Dressing- I  Meal Prep- I  Eating- I  Maintaining continence- I  Transferring/Ambulation- I  Managing Meds- I  Follow up appointments reviewed:  PCP Hospital f/u appt confirmed? Yes  Scheduled to see Dr. Howie Ill on 12/16/21 @ 0945. [facility] f/u appt confirmed? No   Are transportation arrangements needed? No  If their condition worsens, is the pt aware to call PCP or go to the Emergency Dept.? Yes Was the patient provided with contact information for the PCP's office or ED? Yes Was to pt encouraged to call back with questions or concerns? Yes  SDOH assessments and interventions completed:   Yes  Care Coordination Interventions Activated:  Yes   Care Coordination  Interventions:  Referred for Care Coordination Services:  RN Care Coordinator    Encounter Outcome:  Pt. Visit Completed

## 2021-11-23 NOTE — Patient Outreach (Signed)
  Care Coordination [facility] Note Transition Care Management Unsuccessful Follow-up Telephone Call  Date of discharge and from where[name]  Attempts:  1st Attempt  Reason for unsuccessful TCM follow-up call:  Left voice message  Johnney Killian, RN, BSN, CCM Care Management Coordinator [facility] Phone: [phone]: [phone]

## 2021-11-24 ENCOUNTER — Ambulatory Visit: Payer: Self-pay

## 2021-11-24 NOTE — Patient Outreach (Signed)
  Care Coordination   Follow Up Visit Note   11/24/2021 Name: ATHEA HALEY [name]: [id number] DOB: [birth year]  SALMA WALROND is a 71 y.o. year old female who sees Atway, Rayann N, DO for primary care. I spoke with  [name] by phone today  What matters to the patients health and wellness today?  "I am concerned about my medications"    Goals Addressed               This Visit's Progress     I need to manage my Heart Failure Medications (pt-stated)        Care Coordination Interventions: Educated patient on importance of medication compliance to prevent CHF exacerbations.  Collaborated with PCP on medications              SDOH assessments and interventions completed:  Yes  SDOH Interventions Today    Flowsheet Row Most Recent Value  SDOH Interventions   Housing Interventions Intervention Not Indicated  Transportation Interventions Intervention Not Indicated        Care Coordination Interventions Activated:  Yes  Care Coordination Interventions:  Yes, provided   Follow up plan: Referral made to [place], RN    Encounter Outcome:  Pt. Visit Completed

## 2021-11-25 ENCOUNTER — Other Ambulatory Visit: Payer: Self-pay | Admitting: Internal Medicine

## 2021-11-25 DIAGNOSIS — M25562 Pain in left knee: Secondary | ICD-10-CM | POA: Diagnosis not present

## 2021-11-25 DIAGNOSIS — R030 Elevated blood-pressure reading, without diagnosis of hypertension: Secondary | ICD-10-CM | POA: Diagnosis not present

## 2021-11-25 DIAGNOSIS — Z79899 Other long term (current) drug therapy: Secondary | ICD-10-CM | POA: Diagnosis not present

## 2021-11-25 DIAGNOSIS — M25561 Pain in right knee: Secondary | ICD-10-CM | POA: Diagnosis not present

## 2021-11-25 DIAGNOSIS — G8929 Other chronic pain: Secondary | ICD-10-CM | POA: Diagnosis not present

## 2021-11-25 DIAGNOSIS — I10 Essential (primary) hypertension: Secondary | ICD-10-CM

## 2021-11-28 ENCOUNTER — Other Ambulatory Visit: Payer: Self-pay | Admitting: Internal Medicine

## 2021-11-28 ENCOUNTER — Other Ambulatory Visit: Payer: Self-pay | Admitting: *Deleted

## 2021-11-28 DIAGNOSIS — I10 Essential (primary) hypertension: Secondary | ICD-10-CM

## 2021-11-28 DIAGNOSIS — I5032 Chronic diastolic (congestive) heart failure: Secondary | ICD-10-CM

## 2021-11-28 MED ORDER — LETROZOLE 2.5 MG PO TABS: ORAL_TABLET | ORAL | 3 refills | Status: DC

## 2021-11-29 ENCOUNTER — Other Ambulatory Visit: Payer: Self-pay | Admitting: *Deleted

## 2021-11-29 DIAGNOSIS — I10 Essential (primary) hypertension: Secondary | ICD-10-CM

## 2021-11-29 DIAGNOSIS — E782 Mixed hyperlipidemia: Secondary | ICD-10-CM

## 2021-11-29 DIAGNOSIS — Z79899 Other long term (current) drug therapy: Secondary | ICD-10-CM | POA: Diagnosis not present

## 2021-11-29 NOTE — Telephone Encounter (Signed)
Call to patient to see if she requested transfer of medications.  Patient stated yes.  Also requesting refill on her Furosemide .

## 2021-11-30 ENCOUNTER — Other Ambulatory Visit: Payer: Self-pay

## 2021-11-30 DIAGNOSIS — I10 Essential (primary) hypertension: Secondary | ICD-10-CM

## 2021-11-30 DIAGNOSIS — I5032 Chronic diastolic (congestive) heart failure: Secondary | ICD-10-CM

## 2021-12-02 ENCOUNTER — Ambulatory Visit (INDEPENDENT_AMBULATORY_CARE_PROVIDER_SITE_OTHER): Payer: Medicare Other

## 2021-12-02 ENCOUNTER — Ambulatory Visit (INDEPENDENT_AMBULATORY_CARE_PROVIDER_SITE_OTHER): Payer: Medicare Other | Admitting: Internal Medicine

## 2021-12-02 VITALS — BP 142/84 | HR 85 | Temp 97.7°F | Ht 74.0 in | Wt 366.8 lb

## 2021-12-02 VITALS — BP 151/84 | HR 115 | Temp 97.7°F | Ht 74.0 in | Wt 366.8 lb

## 2021-12-02 DIAGNOSIS — I110 Hypertensive heart disease with heart failure: Secondary | ICD-10-CM | POA: Diagnosis not present

## 2021-12-02 DIAGNOSIS — Z7985 Long-term (current) use of injectable non-insulin antidiabetic drugs: Secondary | ICD-10-CM | POA: Diagnosis not present

## 2021-12-02 DIAGNOSIS — Z0000 Encounter for general adult medical examination without abnormal findings: Secondary | ICD-10-CM | POA: Diagnosis not present

## 2021-12-02 DIAGNOSIS — F17210 Nicotine dependence, cigarettes, uncomplicated: Secondary | ICD-10-CM

## 2021-12-02 DIAGNOSIS — J449 Chronic obstructive pulmonary disease, unspecified: Secondary | ICD-10-CM

## 2021-12-02 DIAGNOSIS — E119 Type 2 diabetes mellitus without complications: Secondary | ICD-10-CM

## 2021-12-02 DIAGNOSIS — G4733 Obstructive sleep apnea (adult) (pediatric): Secondary | ICD-10-CM | POA: Diagnosis not present

## 2021-12-02 DIAGNOSIS — I10 Essential (primary) hypertension: Secondary | ICD-10-CM

## 2021-12-02 DIAGNOSIS — I509 Heart failure, unspecified: Secondary | ICD-10-CM | POA: Diagnosis not present

## 2021-12-02 DIAGNOSIS — I639 Cerebral infarction, unspecified: Secondary | ICD-10-CM | POA: Diagnosis not present

## 2021-12-02 DIAGNOSIS — J439 Emphysema, unspecified: Secondary | ICD-10-CM

## 2021-12-02 DIAGNOSIS — Z720 Tobacco use: Secondary | ICD-10-CM

## 2021-12-02 DIAGNOSIS — I5032 Chronic diastolic (congestive) heart failure: Secondary | ICD-10-CM | POA: Diagnosis not present

## 2021-12-02 DIAGNOSIS — R7303 Prediabetes: Secondary | ICD-10-CM | POA: Diagnosis not present

## 2021-12-02 DIAGNOSIS — J9611 Chronic respiratory failure with hypoxia: Secondary | ICD-10-CM | POA: Diagnosis not present

## 2021-12-02 DIAGNOSIS — M170 Bilateral primary osteoarthritis of knee: Secondary | ICD-10-CM | POA: Diagnosis not present

## 2021-12-02 HISTORY — DX: Cerebral infarction, unspecified: I63.9

## 2021-12-02 LAB — POCT GLYCOSYLATED HEMOGLOBIN (HGB A1C): Hemoglobin A1C: 5.8 % — AB (ref 4.0–5.6)

## 2021-12-02 LAB — GLUCOSE, CAPILLARY: Glucose-Capillary: 116 mg/dL — ABNORMAL HIGH (ref 70–99)

## 2021-12-02 MED ORDER — SEMAGLUTIDE(0.25 OR 0.5MG/DOS) 2 MG/3ML ~~LOC~~ SOPN: 0.5000 [IU] | PEN_INJECTOR | SUBCUTANEOUS | 2 refills | Status: DC

## 2021-12-02 MED ORDER — TORSEMIDE 20 MG PO TABS: ORAL_TABLET | ORAL | 1 refills | Status: DC

## 2021-12-02 MED ORDER — ATORVASTATIN CALCIUM 80 MG PO TABS: ORAL_TABLET | ORAL | 3 refills | Status: DC

## 2021-12-02 MED ORDER — LOSARTAN POTASSIUM 50 MG PO TABS: 50.0000 mg | ORAL_TABLET | Freq: Every day | ORAL | 11 refills | Status: DC

## 2021-12-02 MED ORDER — POTASSIUM CHLORIDE CRYS ER 20 MEQ PO TBCR: 20.0000 meq | EXTENDED_RELEASE_TABLET | Freq: Every day | ORAL | 0 refills | Status: DC

## 2021-12-02 MED ORDER — METOPROLOL SUCCINATE ER 25 MG PO TB24: 25.0000 mg | ORAL_TABLET | Freq: Every day | ORAL | 1 refills | Status: DC

## 2021-12-02 NOTE — Patient Instructions (Addendum)
Thank you, [name] for allowing [place] to provide your care today. Today we discussed:  Shortness of breath: It seems that you have been more short of breath for some time. It is important that we treat your COPD and heart failure as best we can to improve you function. I am going to reach out to Dr. Elsworth Soho to have you follow-up with him for COPD. Please continue trying to cut down on smoking. I have sent in refill on torsemide. Take this in the morning and evening. Your chest imaging from last week looks like you have some fluid on your lungs. The medication will help prevent that.  Hypertension: You blood pressure is elevated today. I am increasing losartan from 25 to '50mg'$ . Take this once daily. Please continue taking metoprolol and imdur as you have been.  Weight loss: I am increasing semaglutide to 0.5 mg weekly.      I have ordered the following labs for you:  Lab Orders         Glucose, capillary         POC Hbg A1C       Referrals ordered today:   Referral Orders  No referral(s) requested today     I have ordered the following medication/changed the following medications:   Stop the following medications: Medications Discontinued During This Encounter  Medication Reason   predniSONE (DELTASONE) 10 MG tablet Completed Course   metolazone (ZAROXOLYN) 2.5 MG tablet Discontinued by provider   nicotine (NICOTROL) 10 MG inhaler    nicotine (NICODERM CQ - DOSED IN MG/24 HOURS) 21 mg/24hr patch    losartan (COZAAR) 25 MG tablet    ibuprofen (ADVIL) 200 MG tablet Discontinued by provider   torsemide (DEMADEX) 20 MG tablet Reorder   Semaglutide,0.25 or 0.'5MG'$ /DOS, 2 MG/3ML SOPN Reorder     Start the following medications: Meds ordered this encounter  Medications   torsemide (DEMADEX) 20 MG tablet    Sig: Take 2 tablets by mouth daily, you may take an extra 1/2 tablet only as needed for weight gain of 2 lbs overnight or 5 lbs in a week    Dispense:  225 tablet     Refill:  1   Semaglutide,0.25 or 0.'5MG'$ /DOS, 2 MG/3ML SOPN    Sig: Inject 0.5 Units into the skin once a week.    Dispense:  3 mL    Refill:  2   losartan (COZAAR) 50 MG tablet    Sig: Take 1 tablet (50 mg total) by mouth daily.    Dispense:  30 tablet    Refill:  11     Follow up:  2 weeks    Remember: please take torsemide  We look forward to seeing you next time. Please call our clinic at [phone] if you have any questions or concerns. The best time to call is Monday-Friday from 9am-4pm, but there is someone available 24/7. If after hours or the weekend, call the main hospital number and ask for the Internal Medicine Resident On-Call. If you need medication refills, please notify your pharmacy one week in advance and they will send [place] a request.   Thank you for trusting me with your care. Wishing you the best!   Sharon Vasquez, [place]

## 2021-12-02 NOTE — Assessment & Plan Note (Signed)
Patient continues to smoke. She has decreased amount from 2 packs per day about 2 years ago to about 4 daily now. She has tried patches in the past. Removed nicotine patch and inhaler from med list as she reports she is not currently using them. A/P: Revisit smoking cessation aids at follow-up

## 2021-12-02 NOTE — Progress Notes (Addendum)
Subjective:  CC: ED follow-up  HPI:  [name] is a 71 y.o. female with a past medical history stated below and presents today for follow-up from ED visit 8/22 for shortness of breath and hypertension. She states that shortness of breath has been present for at least last several months. She is adherent with trelegy inhaler and uses home oxygen of 2-3 L with ambulation. On 8/22, she noted increased difficulty with bending her knees which typically happens when she is getting more fluid. She has a lot of chronic pain, but felt that her pain was a little worse and checked BP. It was elevated >200/100 at that time. She took blood pressure medications and a double dose of metoprolol without improvement. At ED she was treated with predisone, doxycycline, hydral, and IV lasix for presumed heart failure exacerbation vs COPD exacerbation. . Please see problem based assessment and plan for additional details.  Past Medical History:  Diagnosis Date   A-fib [facility])    Anxiety    Arthritis    "qwhre; joints, back" (04/17/2017)   Benign breast cyst in female, left 01/08/2017   Found by Screening mammogram, evaluated by [facility] on 01/08/17 and determined to be a benign simple breast cyst.   Breast cancer ([place])    Cellulitis of left lower leg 05/30/2017   CHF (congestive heart failure) (HCC)    Chronic low back pain 08/21/2016   Chronic lower back pain    Chronic venous insufficiency    /notes 05/30/2017   COPD (chronic obstructive pulmonary disease) ([place])    Depression    Diabetes mellitus without complication ([place])    DVT (deep venous thrombosis) ([place]) 11/16/2016   Dysrhythmia    GERD (gastroesophageal reflux disease)    Headache    "weekly for the last 3 months" (04/17/2017)   Hyperlipidemia    Hypertension    Morbid obesity ([place])    PE (pulmonary embolism)    Pulmonary embolism ([place]) 09/21/2014   Sleep apnea     Current Outpatient Medications on File Prior to Visit  Medication Sig  Dispense Refill   albuterol (VENTOLIN HFA) 108 (90 Base) MCG/ACT inhaler Inhale 2 puffs into the lungs every 4 (four) hours as needed for shortness of breath. 18 g 0   aspirin 81 MG chewable tablet Chew 81 mg by mouth daily.     atorvastatin (LIPITOR) 80 MG tablet TAKE 1 TABLET BY MOUTH EVERY DAY 90 tablet 3   esomeprazole (NEXIUM) 40 MG capsule Take 1 capsule (40 mg total) by mouth daily as needed (for heartburn or indigestion). (Patient taking differently: Take 40 mg by mouth daily.) 90 capsule 1   Fluticasone-Umeclidin-Vilant (TRELEGY ELLIPTA) 100-62.5-25 MCG/ACT AEPB Inhale 1 puff into the lungs daily. 1 each 0   gabapentin (NEURONTIN) 400 MG capsule TAKE 1 CAPSULE BY MOUTH  TWICE DAILY 200 capsule 2   ipratropium-albuterol (DUONEB) 0.5-2.5 (3) MG/3ML SOLN Take 3 mLs by nebulization every 6 (six) hours as needed. 360 mL 3   isosorbide mononitrate (IMDUR) 30 MG 24 hr tablet TAKE 1 TABLET(30 MG) BY MOUTH DAILY 90 tablet 1   letrozole (FEMARA) 2.5 MG tablet TAKE 1 TABLET(2.5 MG) BY MOUTH DAILY 90 tablet 3   metoprolol succinate (TOPROL XL) 25 MG 24 hr tablet Take 1 tablet (25 mg total) by mouth daily. 90 tablet 1   mirabegron ER (MYRBETRIQ) 50 MG TB24 tablet Take 1 tablet (50 mg total) by mouth daily. 90 tablet 1   oxyCODONE (ROXICODONE) 15  MG immediate release tablet Take 15 mg by mouth every 6 (six) hours as needed for pain.     OXYGEN Inhale 2-3 L into the lungs daily.     oxymetazoline (AFRIN) 0.05 % nasal spray Place 1 spray into both nostrils 2 (two) times daily as needed for congestion.     potassium chloride SA (KLOR-CON M) 20 MEQ tablet Take 1 tablet (20 mEq total) by mouth daily. 30 tablet 0   PRESCRIPTION MEDICATION Inhale into the lungs at bedtime. cpap     TRELEGY ELLIPTA 100-62.5-25 MCG/ACT AEPB INHALE 1 PUFF INTO THE LUNGS DAILY 60 each 5   XARELTO 20 MG TABS tablet TAKE 1 TABLET(20 MG) BY MOUTH EVERY MORNING 90 tablet 3   No current facility-administered medications on file prior  to visit.    Family History  Problem Relation Age of Onset   Breast cancer Mother        before age 76   Hypertension Mother    Hyperlipidemia Mother    Hypertension Maternal Grandfather    Hyperlipidemia Maternal Grandfather     Social History   Socioeconomic History   Marital status: Divorced    Spouse name: Not on file   Number of children: Not on file   Years of education: Not on file   Highest education level: Not on file  Occupational History   Not on file  Tobacco Use   Smoking status: Some Days    Packs/day: 2.00    Years: 55.00    Total pack years: 110.00    Types: Cigarettes    Start date: 08/16/1961   Smokeless tobacco: Never  Vaping Use   Vaping Use: Never used  Substance and Sexual Activity   Alcohol use: No   Drug use: No   Sexual activity: Not Currently    Partners: Male  Other Topics Concern   Not on file  Social History Narrative   Lives in [facility] in [place]. Lives alone, but is dependent in ADLs/IADLs. 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 Two daughters live nearby. Previously resided in [place].   Social Determinants of Health   Financial Resource Strain: Medium Risk (12/02/2021)   Overall Financial Resource Strain (CARDIA)    Difficulty of Paying Living Expenses: Somewhat hard  Food Insecurity: No Food Insecurity (12/02/2021)   Hunger Vital Sign    Worried About Running Out of Food in the Last Year: Never true    Ran Out of Food in the Last Year: Never true  Transportation Needs: No Transportation Needs (12/02/2021)   PRAPARE - Hydrologist (Medical): No    Lack of Transportation (Non-Medical): No  Physical Activity: Inactive (12/02/2021)   Exercise Vital Sign    Days of Exercise per Week: 0 days    Minutes of Exercise per Session: 0 min  Stress: No Stress Concern Present (12/02/2021)   [place]     Feeling of Stress : Only a little  Social Connections: Moderately Integrated (12/02/2021)   Social Connection and Isolation Panel [NHANES]    Frequency of Communication with Friends and Family: More than three times a week    Frequency of Social Gatherings with Friends and Family: More than three times a week    Attends Religious Services: More than 4 times per year    Active Member of Clubs or Organizations: Yes    Attends  Club or Organization Meetings: More than 4 times per year    Marital Status: Divorced  Intimate Partner Violence: Not At Risk (12/02/2021)   Humiliation, Afraid, Rape, and Kick questionnaire    Fear of Current or Ex-Partner: No    Emotionally Abused: No    Physically Abused: No    Sexually Abused: No    Review of Systems: ROS negative except for what is noted on the assessment and plan.  Objective:   Vitals:   12/02/21 0857 12/02/21 0945  BP: (!) 151/85 (!) 142/84  Pulse: (!) 115 85  Temp: 97.7 F (36.5 C)   TempSrc: Oral   SpO2: 94%   Weight: (!) 366 lb 12.8 oz (166.4 kg)   Height: '6\' 2"'$  (1.88 m)     Physical Exam: Constitutional: chronically ill appearing, sitting in motorized wheelchair Cardiovascular: regular rate and rhythm, no m/r/g Pulmonary/Chest: normal work of breathing on room air, mild wheezing present to lungs bilaterally Abdominal: soft, non-tender, non-distended MSK: 3+ non pitting edema to lower extremities with hardened, thickened skin Neurological: alert & oriented x 3 Skin: warm and dry Psych: normal mood and affect     Assessment & Plan:  Stroke [facility]) CT head completed in ED showed old lacunar infarcts reflective of chronically uncontrolled hypertension. -continue asa 81 mg -atorvastatin 80 mg -LDL at 46 5/23, at goal for secdonary prevention  Tobacco use Patient continues to smoke. She has decreased amount from 2 packs per day about 2 years ago to about 4 daily now. She has tried patches in the past. Removed nicotine patch  and inhaler from med list as she reports she is not currently using them. A/P: Revisit smoking cessation aids at follow-up  Chronic heart failure with preserved ejection fraction (HFpEF) ([place]) Patient returns for follow-up on HFpEF. Last EF 7/22 at  60-65%. Current medications include losartan 25 mg, metoprolol 25 mg, Imdur 10 mg, torsemide 20 mg BID. She has not been taking metolazone. She states that she has difficulty with adherence to diuretic 2/2 to urinary incontinence. She uses purewick system at home. Her weight is down from 377 to 364 since 1/23.  On exam she does not have rales present to lungs and is breathing comfortably on room air, lower extremities with non pitting edema and chronic venous stasis changes.  She states that she take ibuprofen daily to help with swelling. A/P: I talked with patient about concern that she is missing doses of torsemide. We talked about goal of becoming decreasing symptoms by taking torsemide as much as possible. On fill history from pharmacy it does not look like she has filled medication at all recently. Her blood pressure remains uncontrolled, please see htn section. -torsemide 20 mg BID -metoprolol 25 mg qd -increase losartan from 25 to '50mg'$  qd -continue imdur 10 mg -metolazone removed from [employer]. -follow-up in 2 weeks, 9/15 -discontinue daily ibuprofen  COPD (chronic obstructive pulmonary disease) ([place]) Patient reports worsening shortness of breath over the past few months. Shes denies worsening cough and sputum production. Prior PFT 12/22 showed moderate restrictive disease. She is followed by Dr. Elsworth Soho as outpatient and was being worked up for ILD. Current medications include trelegy and oxygen 2-3 L at home. She continues to smoke about 4 cigarettes daily. She has been on prednisone in the past with concerns for hypersensitivity pneumonitis vs ILD from HRCT. She is not a good candidate for long term steroid therapy or transbronchial  biopsy A/P: Low concern for COPD exacerbation at this  time.  Will focus more time on smoking cessation at follow-up. Continue trelegy Message sent to Dr. Elsworth Soho for follow-up with continued shortness of breath.  Benign essential HTN Patient blood pressure elevated to 154/85 improved to 142/84. She reports adherence with losartan '25mg'$ , imdur 10 mg, and metoprolol 25 mg. She does not check her blood pressure regularly at home. She did check day of going to ED because of more swelling in knees making it difficulty to walk and increased pain.  A/P: Increase losartan from 25 to 50 mg Continue metoprolol 25 mg, imdur 10 mg -follow-up 9/15   Prediabetes Addendum: Hgb a1c improved from  6.2 to 5.8. She started ozempic 5/23 and has lost about 13 lbs since 1/23. She denies adverse effects from medication. A/P: Increase ozempic from 0.25 to 0.'5mg'$ .     Patient discussed with Dr. Erroll Luna Maritza Hosterman, D.O. [facility]  PGY-2 Pager: [phone]  Phone: [phone] Date 12/02/2021  Time 1:49 PM

## 2021-12-02 NOTE — Patient Instructions (Signed)

## 2021-12-02 NOTE — Assessment & Plan Note (Signed)
CT head completed in ED showed old lacunar infarcts reflective of chronically uncontrolled hypertension. -continue asa 81 mg -atorvastatin 80 mg -LDL at 46 5/23, at goal for secdonary prevention

## 2021-12-02 NOTE — Assessment & Plan Note (Addendum)
Patient returns for follow-up on HFpEF. Last EF 7/22 at  60-65%. Current medications include losartan 25 mg, metoprolol 25 mg, Imdur 10 mg, torsemide 20 mg BID. She has not been taking metolazone. She states that she has difficulty with adherence to diuretic 2/2 to urinary incontinence. She uses purewick system at home. Her weight is down from 377 to 364 since 1/23.  On exam she does not have rales present to lungs and is breathing comfortably on room air, lower extremities with non pitting edema and chronic venous stasis changes.  She states that she take ibuprofen daily to help with swelling. A/P: I talked with patient about concern that she is missing doses of torsemide. We talked about goal of decreasing symptoms by taking torsemide as much as possible. On fill history from pharmacy it does not look like she has filled medication at all recently. Her blood pressure remains uncontrolled, please see htn section. -torsemide 20 mg BID -metoprolol 25 mg qd -increase losartan from 25 to '50mg'$  qd -continue imdur 10 mg -metolazone removed from [employer]. -follow-up in 2 weeks, 9/15 -discontinue daily ibuprofen

## 2021-12-02 NOTE — Assessment & Plan Note (Addendum)
Patient reports worsening shortness of breath over the past few months. Shes denies worsening cough and sputum production. Prior PFT 12/22 showed moderate restrictive disease. She is followed by Dr. Elsworth Soho as outpatient and was being worked up for ILD. Current medications include trelegy and oxygen 2-3 L at home. She continues to smoke about 4 cigarettes daily. She has been on prednisone in the past with concerns for hypersensitivity pneumonitis vs ILD from HRCT. She is not a good candidate for long term steroid therapy or transbronchial biopsy A/P: Low concern for COPD exacerbation at this time.  Will focus more time on smoking cessation at follow-up. Continue trelegy Message sent to Dr. Elsworth Soho for follow-up with continued shortness of breath.

## 2021-12-02 NOTE — Assessment & Plan Note (Addendum)
Addendum: Hgb a1c improved from  6.2 to 5.8. She started ozempic 5/23 and has lost about 13 lbs since 1/23. She denies adverse effects from medication. A/P: Increase ozempic from 0.25 to 0.'5mg'$ .

## 2021-12-02 NOTE — Assessment & Plan Note (Signed)
Patient blood pressure elevated to 154/85 improved to 142/84. She reports adherence with losartan '25mg'$ , imdur 10 mg, and metoprolol 25 mg. She does not check her blood pressure regularly at home. She did check day of going to ED because of more swelling in knees making it difficulty to walk and increased pain.  A/P: Increase losartan from 25 to 50 mg Continue metoprolol 25 mg, imdur 10 mg -follow-up 9/15

## 2021-12-02 NOTE — Progress Notes (Signed)
Subjective:   Sharon Vasquez is a 71 y.o. female who presents for an Initial Medicare Annual Wellness Visit. I connected with  [name] on 12/02/21 by a  in person visit    Patient Location: Other:  [facility]  Provider Location: Office/Clinic  Review of Systems    Defer to PCP.        Objective:    Today's Vitals   12/02/21 0916  BP: (!) 151/84  Pulse: (!) 115  Temp: 97.7 F (36.5 C)  TempSrc: Oral  SpO2: 94%  Weight: (!) 366 lb 12.8 oz (166.4 kg)  Height: '6\' 2"'$  (1.88 m)  PainSc: 8    Body mass index is 47.09 kg/m.     12/02/2021    9:19 AM 12/02/2021    9:02 AM 08/25/2021    9:02 AM 04/27/2021    9:19 AM 04/12/2021    9:34 AM 04/12/2021    9:30 AM 04/08/2021    9:24 AM  Advanced Directives  Does Patient Have a Medical Advance Directive? No No No No No No No  Would patient like information on creating a medical advance directive? No - Patient declined No - Patient declined No - Patient declined No - Patient declined No - Patient declined No - Patient declined Yes (MAU/Ambulatory/Procedural Areas - Information given)    Current Medications (verified) Outpatient Encounter Medications as of 12/02/2021  Medication Sig   albuterol (VENTOLIN HFA) 108 (90 Base) MCG/ACT inhaler Inhale 2 puffs into the lungs every 4 (four) hours as needed for shortness of breath.   aspirin 81 MG chewable tablet Chew 81 mg by mouth daily.   atorvastatin (LIPITOR) 80 MG tablet TAKE 1 TABLET BY MOUTH EVERY DAY   esomeprazole (NEXIUM) 40 MG capsule Take 1 capsule (40 mg total) by mouth daily as needed (for heartburn or indigestion). (Patient taking differently: Take 40 mg by mouth daily.)   Fluticasone-Umeclidin-Vilant (TRELEGY ELLIPTA) 100-62.5-25 MCG/ACT AEPB Inhale 1 puff into the lungs daily.   gabapentin (NEURONTIN) 400 MG capsule TAKE 1 CAPSULE BY MOUTH  TWICE DAILY   ipratropium-albuterol (DUONEB) 0.5-2.5 (3) MG/3ML SOLN Take 3 mLs by nebulization every 6 (six) hours as  needed.   isosorbide mononitrate (IMDUR) 30 MG 24 hr tablet TAKE 1 TABLET(30 MG) BY MOUTH DAILY   letrozole (FEMARA) 2.5 MG tablet TAKE 1 TABLET(2.5 MG) BY MOUTH DAILY   metoprolol succinate (TOPROL XL) 25 MG 24 hr tablet Take 1 tablet (25 mg total) by mouth daily.   mirabegron ER (MYRBETRIQ) 50 MG TB24 tablet Take 1 tablet (50 mg total) by mouth daily.   oxyCODONE (ROXICODONE) 15 MG immediate release tablet Take 15 mg by mouth every 6 (six) hours as needed for pain.   OXYGEN Inhale 2-3 L into the lungs daily.   oxymetazoline (AFRIN) 0.05 % nasal spray Place 1 spray into both nostrils 2 (two) times daily as needed for congestion.   potassium chloride SA (KLOR-CON M) 20 MEQ tablet TAKE 1 TABLET BY MOUTH DAILY   PRESCRIPTION MEDICATION Inhale into the lungs at bedtime. cpap   TRELEGY ELLIPTA 100-62.5-25 MCG/ACT AEPB INHALE 1 PUFF INTO THE LUNGS DAILY   XARELTO 20 MG TABS tablet TAKE 1 TABLET(20 MG) BY MOUTH EVERY MORNING   [DISCONTINUED] ibuprofen (ADVIL) 200 MG tablet Take 800 mg by mouth every 6 (six) hours as needed for moderate pain.   [DISCONTINUED] losartan (COZAAR) 25 MG tablet TAKE 1 TABLET(25 MG) BY MOUTH DAILY   [DISCONTINUED] metolazone (ZAROXOLYN) 2.5  MG tablet Take 1 tablet (2.5 mg total) by mouth every other day. (Patient taking differently: Take 2.5 mg by mouth daily as needed (extra fluid not covered relieved by torsemide).)   [DISCONTINUED] nicotine (NICODERM CQ - DOSED IN MG/24 HOURS) 21 mg/24hr patch Place 21 mg onto the skin daily.   [DISCONTINUED] nicotine (NICOTROL) 10 MG inhaler Inhale 1 Cartridge (1 continuous puffing total) into the lungs as needed for smoking cessation. (Patient not taking: Reported on 11/07/2021)   [DISCONTINUED] Semaglutide,0.25 or 0.'5MG'$ /DOS, 2 MG/3ML SOPN Inject 0.25 Units into the skin once a week.   [DISCONTINUED] torsemide (DEMADEX) 20 MG tablet Take 2 tablets by mouth daily, you may take an extra 1/2 tablet only as needed for weight gain of 2 lbs  overnight or 5 lbs in a week (Patient taking differently: Take 20 mg by mouth 2 (two) times daily.)   No facility-administered encounter medications on file as of 12/02/2021.    Allergies (verified) Patient has no known allergies.   History: Past Medical History:  Diagnosis Date   A-fib ([place])    Anxiety    Arthritis    "qwhre; joints, back" (04/17/2017)   Benign breast cyst in female, left 01/08/2017   Found by Screening mammogram, evaluated by [facility] on 01/08/17 and determined to be a benign simple breast cyst.   Breast cancer ([place])    Cellulitis of left lower leg 05/30/2017   CHF (congestive heart failure) ([facility])    Chronic low back pain 08/21/2016   Chronic lower back pain    Chronic venous insufficiency    /notes 05/30/2017   COPD (chronic obstructive pulmonary disease) ([place])    Depression    Diabetes mellitus without complication ([place])    DVT (deep venous thrombosis) ([place]) 11/16/2016   Dysrhythmia    GERD (gastroesophageal reflux disease)    Headache    "weekly for the last 3 months" (04/17/2017)   Hyperlipidemia    Hypertension    Morbid obesity ([place])    PE (pulmonary embolism)    Pulmonary embolism ([place]) 09/21/2014   Sleep apnea    Past Surgical History:  Procedure Laterality Date   ABDOMINAL HYSTERECTOMY     APPENDECTOMY     BALLOON DILATION N/A 03/09/2020   Procedure: BALLOON DILATION;  Surgeon: Otis Brace, MD;  Location: [facility];  Service: Gastroenterology;  Laterality: N/A;   BIOPSY  03/09/2020   Procedure: BIOPSY;  Surgeon: Otis Brace, MD;  Location: [facility];  Service: Gastroenterology;;   BIOPSY  04/12/2021   Procedure: BIOPSY;  Surgeon: Otis Brace, MD;  Location: [facility];  Service: Gastroenterology;;   BREAST BIOPSY Right 03/11/2021   US biopsy/ coil clip/ path pending   BREAST BIOPSY Right 03/11/2021   US biopsy/ ribbone clip/ path pending   BREAST CYST EXCISION Left    "six o'clock"   BREAST LUMPECTOMY Left    BREAST  LUMPECTOMY WITH RADIOACTIVE SEED LOCALIZATION Right 05/02/2021   Procedure: RIGHT BREAST LUMPECTOMY WITH RADIOACTIVE SEED LOCALIZATION;  Surgeon: Jovita Kussmaul, MD;  Location: [facility];  Service: General;  Laterality: Right;   CHOLECYSTECTOMY     DILATION AND CURETTAGE OF UTERUS     ESOPHAGOGASTRODUODENOSCOPY (EGD) WITH PROPOFOL N/A 03/09/2020   Procedure: ESOPHAGOGASTRODUODENOSCOPY (EGD) WITH PROPOFOL;  Surgeon: Otis Brace, MD;  [facility];  Service: Gastroenterology;  Laterality: N/A;   ESOPHAGOGASTRODUODENOSCOPY (EGD) WITH PROPOFOL N/A 04/12/2021   Procedure: ESOPHAGOGASTRODUODENOSCOPY (EGD) WITH PROPOFOL;  Surgeon: Otis Brace, MD;  [facility];  Service: Gastroenterology;  Laterality:  N/A;   IR ABLATE LIVER CRYOABLATION  07/23/2019   IR RADIOLOGIST EVAL & MGMT  07/18/2019   SAVORY DILATION N/A 04/12/2021   Procedure: SAVORY DILATION;  Surgeon: Otis Brace, MD;  [facility];  Service: Gastroenterology;  Laterality: N/A;   TONSILLECTOMY AND ADENOIDECTOMY     TUBAL LIGATION     Family History  Problem Relation Age of Onset   Breast cancer Mother        before age 25   Hypertension Mother    Hyperlipidemia Mother    Hypertension Maternal Grandfather    Hyperlipidemia Maternal Grandfather    Social History   Socioeconomic History   Marital status: Divorced    Spouse name: Not on file   Number of children: Not on file   Years of education: Not on file   Highest education level: Not on file  Occupational History   Not on file  Tobacco Use   Smoking status: Some Days    Packs/day: 2.00    Years: 55.00    Total pack years: 110.00    Types: Cigarettes    Start date: 08/16/1961   Smokeless tobacco: Never  Vaping Use   Vaping Use: Never used  Substance and Sexual Activity   Alcohol use: No   Drug use: No   Sexual activity: Not Currently    Partners: Male  Other Topics Concern   Not on file  Social History Narrative   Lives in  [facility] in [place]. Lives alone, but is dependent in ADLs/IADLs. Previously had [facility] and RN but dismissed them with plans to use the [employer]. Two daughters live nearby. Previously resided in [place].   Social Determinants of Health   Financial Resource Strain: Medium Risk (12/02/2021)   Overall Financial Resource Strain (CARDIA)    Difficulty of Paying Living Expenses: Somewhat hard  Food Insecurity: No Food Insecurity (12/02/2021)   Hunger Vital Sign    Worried About Running Out of Food in the Last Year: Never true    Ran Out of Food in the Last Year: Never true  Transportation Needs: No Transportation Needs (12/02/2021)   PRAPARE - Hydrologist (Medical): No    Lack of Transportation (Non-Medical): No  Physical Activity: Inactive (12/02/2021)   Exercise Vital Sign    Days of Exercise per Week: 0 days    Minutes of Exercise per Session: 0 min  Stress: No Stress Concern Present (12/02/2021)   [place]    Feeling of Stress : Only a little  Social Connections: Moderately Integrated (12/02/2021)   Social Connection and Isolation Panel [NHANES]    Frequency of Communication with Friends and Family: More than three times a week    Frequency of Social Gatherings with Friends and Family: More than three times a week    Attends Religious Services: More than 4 times per year    Active Member of [employer] or Organizations: Yes    Attends Music therapist: More than 4 times per year    Marital Status: Divorced    Tobacco Counseling Ready to quit: No Counseling given: Yes   Clinical Intake:  Pre-visit preparation completed: Yes  Pain Score: 8  Pain Type: Chronic pain Pain Location: Leg Pain Orientation: Right, Left Pain Descriptors / Indicators: Aching, Pressure Pain Onset: More than a month ago Pain Frequency: Constant Pain Relieving Factors: pain medication Effect of  Pain on Daily Activities: "Unable  to do ADL, and hurts to stand."  Pain Relieving Factors: pain medication  Diabetes: Yes CBG done?: No Did pt. bring in CBG monitor from home?: No  How often do you need to have someone help you when you read instructions, pamphlets, or other written materials from your doctor or pharmacy?: 1 - Never What is the last grade level you completed in school?: college  Diabetic?Yes  Interpreter Needed?: No  Information entered by :: Jamise Pentland, [place] 12/02/2021   Activities of Daily Living    12/02/2021    9:10 AM 12/02/2021    9:01 AM  In your present state of health, do you have any difficulty performing the following activities:  Hearing? 0 0  Vision? 0 0  Difficulty concentrating or making decisions? 0 0  Walking or climbing stairs? 1 1  Dressing or bathing? 1 1  Doing errands, shopping? 1 1  Preparing Food and eating ? Y   Using the Toilet? Y   In the past six months, have you accidently leaked urine? Y   Do you have problems with loss of bowel control? N   Managing your Medications? N   Managing your Finances? N   Housekeeping or managing your Housekeeping? Y     Patient Care Team: [name], DO as PCP - General Lorretta Harp, MD as PCP - Cardiology (Cardiology) Drema Pry as Social Worker Jovita Kussmaul, MD as Consulting Physician (General Surgery) Nicholas Lose, MD as Consulting Physician (Hematology and Oncology) Kyung Rudd, MD as Consulting Physician (Radiation Oncology) Rockwell Germany, RN as Oncology Nurse Navigator Mauro Kaufmann, RN as Oncology Nurse Navigator  Indicate any recent Medical Services you may have received from other than [facility] providers in the past year (date may be approximate).     Assessment:   This is a routine wellness examination for [place].  Hearing/Vision screen No results found.  Dietary issues and exercise activities discussed:     Goals Addressed   None   Depression Screen     12/02/2021    9:20 AM 12/02/2021    9:02 AM 04/08/2021    9:26 AM 03/03/2021    3:57 PM 02/02/2021   10:31 AM 01/28/2021   10:11 AM 01/21/2021    9:42 AM  PHQ 2/9 Scores  PHQ - 2 Score 0 0 0 0 0  0  PHQ- 9 Score   9  0  0  Exception Documentation      Patient refusal     Fall Risk    12/02/2021    9:19 AM 12/02/2021    9:01 AM 08/25/2021    9:02 AM 04/08/2021    9:25 AM 03/03/2021    3:57 PM  [place] in the past year? 0 0 0 0 0  Number falls in past yr: 0 0   0  Injury with Fall? 0 0   0  Risk for fall due to : No Fall Risks No Fall Risks Impaired balance/gait Impaired balance/gait Impaired balance/gait;Impaired mobility  Follow up Falls evaluation completed Falls evaluation completed Falls evaluation completed;Education provided Falls evaluation completed Falls evaluation completed    FALL RISK PREVENTION PERTAINING TO THE HOME:  Any stairs in or around the home? No  If so, are there any without handrails? No  Home free of loose throw rugs in walkways, pet beds, electrical cords, etc? Yes  Adequate lighting in your home to reduce risk of falls? Yes   ASSISTIVE  DEVICES UTILIZED TO PREVENT FALLS:  Life alert? No  Use of a cane, walker or w/c? Yes  Grab bars in the bathroom? No  Shower chair or bench in shower? No Elevated toilet seat or a handicapped toilet? Yes   TIMED UP AND GO:  Was the test performed? No .  Length of time to ambulate 10 feet: n/a sec.     Cognitive Function:        12/02/2021    9:09 AM  6CIT Screen  What Year? 0 points  What month? 0 points  What time? 0 points  Count back from 20 0 points  Months in reverse 0 points  Repeat phrase 0 points  Total Score 0 points    Immunizations Immunization History  Administered Date(s) Administered   Moderna Sars-Covid-2 Vaccination 08/20/2019, 09/11/2019   PFIZER(Purple Top)SARS-COV-2 Vaccination 03/17/2020   Pneumococcal Conjugate-13 02/01/2017   Pneumococcal Polysaccharide-23 02/12/2020    Tdap 06/05/2019    TDAP status: Up to date  Flu Vaccine status: Up to date  Pneumococcal vaccine status: Up to date  Covid-19 vaccine status: Completed vaccines  Qualifies for Shingles Vaccine? Yes   Zostavax completed No   Shingrix Completed?: No.    Education has been provided regarding the importance of this vaccine. Patient has been advised to call insurance company to determine out of pocket expense if they have not yet received this vaccine. Advised may also receive vaccine at local pharmacy or Health Dept. Verbalized acceptance and understanding.  Screening Tests Health Maintenance  Topic Date Due   OPHTHALMOLOGY EXAM  Never done   Zoster Vaccines- Shingrix (1 of 2) Never done   COVID-19 Vaccine (4 - Mixed Product risk series) 05/12/2020   COLON CANCER SCREENING ANNUAL FOBT  08/20/2020   INFLUENZA VACCINE  01/02/2022 (Originally 11/01/2021)   MAMMOGRAM  03/02/2022   HEMOGLOBIN A1C  06/02/2022   FOOT EXAM  08/26/2022   TETANUS/TDAP  06/04/2029   Pneumonia Vaccine 18+ Years old  Completed   DEXA SCAN  Completed   Hepatitis C Screening  Completed   HPV VACCINES  Aged Out   COLONOSCOPY (Pts 45-48yr Insurance coverage will need to be confirmed)  Discontinued    Health Maintenance  Health Maintenance Due  Topic Date Due   OPHTHALMOLOGY EXAM  Never done   Zoster Vaccines- Shingrix (1 of 2) Never done   COVID-19 Vaccine (4 - Mixed Product risk series) 05/12/2020   COLON CANCER SCREENING ANNUAL FOBT  08/20/2020    Colorectal cancer screening: Type of screening: FOBT/FIT. Completed 08/21/2019. Repeat every 1 years  Mammogram status: Completed 03/02/2021. Repeat every year  Bone Density status: Completed 12/14/2016. Results reflect: Bone density results: OSTEOPOROSIS. Repeat every 2 years.  Lung Cancer Screening: (Low Dose CT Chest recommended if Age 72-80years, 30 pack-year currently smoking OR have quit w/in 15years.) does qualify.   Lung Cancer Screening Referral:  Defer to PCP.  Additional Screening:  Hepatitis C Screening: does qualify; Completed 11/30/2016  Vision Screening: Recommended annual ophthalmology exams for early detection of glaucoma and other disorders of the eye. Is the patient up to date with their annual eye exam?  No  Who is the provider or what is the name of the office in which the patient attends annual eye exams? Defer to PCP.  If pt is not established with a provider, would they like to be referred to a provider to establish care? Yes .   Dental Screening: Recommended annual dental exams for proper oral hygiene  Community Resource Referral / Chronic Care Management: CRR required this visit?  No   CCM required this visit?  No      Plan:     I have personally reviewed and noted the following in the patient's chart:   Medical and social history Use of alcohol, tobacco or illicit drugs  Current medications and supplements including opioid prescriptions. Patient is currently taking opioid prescriptions. Information provided to patient regarding non-opioid alternatives. Patient advised to discuss non-opioid treatment plan with their provider. Functional ability and status Nutritional status Physical activity Advanced directives List of other physicians Hospitalizations, surgeries, and ER visits in previous 12 months Vitals Screenings to include cognitive, depression, and falls Referrals and appointments  In addition, I have reviewed and discussed with patient certain preventive protocols, quality metrics, and best practice recommendations. A written personalized care plan for preventive services as well as general preventive health recommendations were provided to patient.     Daegen Berrocal, CMA   12/02/2021   Nurse Notes: Face to Face, 20 minutes.   Ms. Hepp , Thank you for taking time to come for your Medicare Wellness Visit. I appreciate your ongoing commitment to your health goals. Please review the following plan  we discussed and let me know if I can assist you in the future.   These are the goals we discussed:  Goals       I need to manage my Heart Failure Medications (pt-stated)      Care Coordination Interventions: Educated patient on importance of medication compliance to prevent CHF exacerbations.  Collaborated with PCP on medications            Make and Keep All Appointments      Duplicate goal Timeframe:  Long-Range Goal Priority:  High Start Date:            05/10/20                 Expected End Date:    ongoing                  Follow Up Date 01/30/21   - arrange a ride through an agency 1 week before appointment - ask family or friend for a ride - call to cancel if needed - keep a calendar with prescription refill dates - keep a calendar with appointment dates - learn the bus route - use public transportation    Why is this important?   Part of staying healthy is seeing the doctor for follow-up care.  If you forget your appointments, there are some things you can do to stay on track.    Notes: 11/15/20 meeting goal        This is a list of the screening recommended for you and due dates:  Health Maintenance  Topic Date Due   Eye exam for diabetics  Never done   Zoster (Shingles) Vaccine (1 of 2) Never done   COVID-19 Vaccine (4 - Mixed Product risk series) 05/12/2020   Stool Blood Test  08/20/2020   Flu Shot  01/02/2022*   Mammogram  03/02/2022   Hemoglobin A1C  06/02/2022   Complete foot exam   08/26/2022   Tetanus Vaccine  06/04/2029   Pneumonia Vaccine  Completed   DEXA scan (bone density measurement)  Completed   Hepatitis C Screening: USPSTF Recommendation to screen - Ages 43-79 yo.  Completed   HPV Vaccine  Aged Out   Colon Cancer Screening  Discontinued  *Topic was postponed.  The date shown is not the original due date.

## 2021-12-06 ENCOUNTER — Telehealth: Payer: Self-pay | Admitting: Pulmonary Disease

## 2021-12-06 NOTE — Telephone Encounter (Signed)
-----   Message from Rigoberto Noel, MD sent at 12/04/2021 11:43 AM EDT ----- Regarding: RE: Work-up for ILD Certainly ! I did intend for her to follow-up in 4 months with APP. Please make follow-up appointment with Derl Barrow ----- Message ----- From: Christiana Fuchs, DO Sent: 12/02/2021   1:41 PM EDT To: Rigoberto Noel, MD Subject: Work-up for ILD                                Hey Dr. Elsworth Soho,  I wanted to check in about Ms. Schum. You saw her last 3/23 and HRCT concerning for ILD. Your note mentions that she is not a good candidate for long term steroids or transbronchial biopsy. I wanted to check in to see if she could follow-up back up with you as her shortness of breath continues to progress.  Thank you, Daleen Bo. Masters, D.O.  Internal Medicine Resident, PGY-2 [name]  1:41 PM, 12/02/2021

## 2021-12-06 NOTE — Progress Notes (Signed)
Internal Medicine Clinic Attending  Case discussed with Dr. Masters  At the time of the visit.  We reviewed the resident's history and exam and pertinent patient test results.  I agree with the assessment, diagnosis, and plan of care documented in the resident's note.  

## 2021-12-06 NOTE — Telephone Encounter (Signed)
I did intend for her to follow-up in 4 months with APP. Please make follow-up appointment with Derl Barrow

## 2021-12-07 NOTE — Telephone Encounter (Signed)
Called patient and left message to call back to schedule appointment.

## 2021-12-07 NOTE — Telephone Encounter (Signed)
Appt scheduled for 12/13/21 with Derl Barrow

## 2021-12-10 ENCOUNTER — Other Ambulatory Visit: Payer: Self-pay | Admitting: Internal Medicine

## 2021-12-10 DIAGNOSIS — E119 Type 2 diabetes mellitus without complications: Secondary | ICD-10-CM

## 2021-12-11 DIAGNOSIS — G4733 Obstructive sleep apnea (adult) (pediatric): Secondary | ICD-10-CM | POA: Diagnosis not present

## 2021-12-11 DIAGNOSIS — M170 Bilateral primary osteoarthritis of knee: Secondary | ICD-10-CM | POA: Diagnosis not present

## 2021-12-11 NOTE — Progress Notes (Unsigned)
$'@Patient't$  ID: Sharon Vasquez, female    DOB: 1951-02-20, 71 y.o.   [name]: [id number]  No chief complaint on file.   Referring provider: [name], DO  HPI: 71 year old female, current some day smoker.  Past medical history significant for COPD, RB-ILD, chronic respiratory failure with hypoxia, OSA, hypertension, A. fib, chronic anticoagulation, diastolic heart failure, aortic arthrosclerosis GERD, osteoporosis, chronic pain syndrome, obesity. [name].  Maintained on CPAP.  Amiodarone discontinued.  Smoking cessation encouraged.  PMH - chronic diastolic heart failure.   -pulmonary embolism/ LLE DVT 2015  on Xarelto  s/p venoplasty and stenting of left common iliac and external iliac vein Duplex 05/2017 did not show any residual stenosis. -on Xarelto for chronic atrial fibrillation , amiodarone stopped 10/2019  Previous [facility] pulmonary encounter: 06/23/21- Dr. Elsworth Soho Chief Complaint  Patient presents with   Follow-up    Follow up to talk about CT results. Pt states she wants to discuss prednisone because she is having some SOB    Reports increased shortness of breath over the past few weeks.  Previously when she took prednisone does remarkably help with her breathing. She continues to smoke a few cigarettes daily.  This is in spite of wearing a nicotine patch and using nicotine gum. She arrives in a wheelchair and on oxygen.  We reviewed high-resolution CT chest and previous PFTs which are mainly shown restriction She continues to be poorly compliant with diuretic, she does have a PureWick system which she uses occasionally  She denies any problems with CPAP mask or pressure, she is very compliant  12/13/2021 - Interim hx Patient presents today for overdue follow-up ILD/OSA. Last seen in March 2023 by Dr. Elsworth Soho.       Significant tests/ events reviewed  HRCT 05/2021  " alternative " mild progression compared to the prior study and extensive air trapping, this is favored  to reflect developing chronic hypersensitivity pneumonitis  HRCT 05/2020 >> probable RB ILD, no worsening, enlarged pulmonary artery 4.4 cm, 4.4 cm ascending aortic aneurysm   HRCT 10/2019 >> Mild patchy ground-glass opacity and minimal subpleural reticulation without bronchiectasis or honeycombing. Extensive air trapping >> RBILD, enlarged PA   HRCT 12/2017 >> Mild interlobular septal thickening and mild patchy peribronchovascular ground-glass opacity in both lungs. Minimal patchy subpleural reticulation in the upper lobes ? Pul edema vs RB-ILD   PFTs 03/2021 moderate restriction, ratio 89, FEV1 74%, FVC 65%, TLC 54%, DLCO 15.3/62%   PFTs 12/2017 - No obstruction, mild-mod restriction, decreased DLCO  FVC 2.16 (66%), FEV1 1.93 (75%), ratio 89, DLCOcor 16.45 (48%)   FENO 12/2017 5   Echo 10/2020 moderate LVH, RVSP 37  ECHO 07/2016 - EF 60-65%, PA peak pressure 90m Hg   HST 12/2017 >> AHI 20/h   Esophagram 01/2020 >> Smooth strictured narrowing involving the distal esophagus near the GE junction  No Known Allergies  Immunization History  Administered Date(s) Administered   Moderna Sars-Covid-2 Vaccination 08/20/2019, 09/11/2019   PFIZER(Purple Top)SARS-COV-2 Vaccination 03/17/2020   Pneumococcal Conjugate-13 02/01/2017   Pneumococcal Polysaccharide-23 02/12/2020   Tdap 06/05/2019    Past Medical History:  Diagnosis Date   A-fib ([facility]    Anxiety    Arthritis    "qwhre; joints, back" (04/17/2017)   Benign breast cyst in female, left 01/08/2017   Found by Screening mammogram, evaluated by U/S on 01/08/17 and determined to be a benign simple breast cyst.   Breast cancer ([facility]    Cellulitis of left lower leg  05/30/2017   CHF (congestive heart failure) (HCC)    Chronic low back pain 08/21/2016   Chronic lower back pain    Chronic venous insufficiency    /notes 05/30/2017   COPD (chronic obstructive pulmonary disease) ([place])    Depression    Diabetes mellitus without complication  (HCC)    DVT (deep venous thrombosis) ([place]) 11/16/2016   Dysrhythmia    GERD (gastroesophageal reflux disease)    Headache    "weekly for the last 3 months" (04/17/2017)   Hyperlipidemia    Hypertension    Morbid obesity ([place])    PE (pulmonary embolism)    Pulmonary embolism ([place]) 09/21/2014   Sleep apnea     Tobacco History: Social History   Tobacco Use  Smoking Status Some Days   Packs/day: 2.00   Years: 55.00   Total pack years: 110.00   Types: Cigarettes   Start date: 08/16/1961  Smokeless Tobacco Never   Ready to quit: Not Answered Counseling given: Not Answered   Outpatient Medications Prior to Visit  Medication Sig Dispense Refill   albuterol (VENTOLIN HFA) 108 (90 Base) MCG/ACT inhaler Inhale 2 puffs into the lungs every 4 (four) hours as needed for shortness of breath. 18 g 0   aspirin 81 MG chewable tablet Chew 81 mg by mouth daily.     atorvastatin (LIPITOR) 80 MG tablet TAKE 1 TABLET BY MOUTH EVERY DAY 90 tablet 3   esomeprazole (NEXIUM) 40 MG capsule Take 1 capsule (40 mg total) by mouth daily as needed (for heartburn or indigestion). (Patient taking differently: Take 40 mg by mouth daily.) 90 capsule 1   Fluticasone-Umeclidin-Vilant (TRELEGY ELLIPTA) 100-62.5-25 MCG/ACT AEPB Inhale 1 puff into the lungs daily. 1 each 0   gabapentin (NEURONTIN) 400 MG capsule TAKE 1 CAPSULE BY MOUTH  TWICE DAILY 200 capsule 2   ipratropium-albuterol (DUONEB) 0.5-2.5 (3) MG/3ML SOLN Take 3 mLs by nebulization every 6 (six) hours as needed. 360 mL 3   isosorbide mononitrate (IMDUR) 30 MG 24 hr tablet TAKE 1 TABLET(30 MG) BY MOUTH DAILY 90 tablet 1   letrozole (FEMARA) 2.5 MG tablet TAKE 1 TABLET(2.5 MG) BY MOUTH DAILY 90 tablet 3   losartan (COZAAR) 50 MG tablet Take 1 tablet (50 mg total) by mouth daily. 30 tablet 11   metoprolol succinate (TOPROL XL) 25 MG 24 hr tablet Take 1 tablet (25 mg total) by mouth daily. 90 tablet 1   mirabegron ER (MYRBETRIQ) 50 MG TB24 tablet Take 1  tablet (50 mg total) by mouth daily. 90 tablet 1   oxyCODONE (ROXICODONE) 15 MG immediate release tablet Take 15 mg by mouth every 6 (six) hours as needed for pain.     OXYGEN Inhale 2-3 L into the lungs daily.     oxymetazoline (AFRIN) 0.05 % nasal spray Place 1 spray into both nostrils 2 (two) times daily as needed for congestion.     potassium chloride SA (KLOR-CON M) 20 MEQ tablet Take 1 tablet (20 mEq total) by mouth daily. 30 tablet 0   PRESCRIPTION MEDICATION Inhale into the lungs at bedtime. cpap     Semaglutide,0.25 or 0.'5MG'$ /DOS, 2 MG/3ML SOPN Inject 0.5 Units into the skin once a week. 3 mL 2   torsemide (DEMADEX) 20 MG tablet Take 2 tablets by mouth daily, you may take an extra 1/2 tablet only as needed for weight gain of 2 lbs overnight or 5 lbs in a week 225 tablet 1   TRELEGY ELLIPTA 100-62.5-25 MCG/ACT AEPB INHALE  1 PUFF INTO THE LUNGS DAILY 60 each 5   XARELTO 20 MG TABS tablet TAKE 1 TABLET(20 MG) BY MOUTH EVERY MORNING 90 tablet 3   No facility-administered medications prior to visit.      Review of Systems  Review of Systems   Physical Exam  There were no vitals taken for this visit. Physical Exam   Lab Results:  CBC    Component Value Date/Time   WBC 4.7 11/22/2021 1411   RBC 4.32 11/22/2021 1411   HGB 12.2 11/22/2021 1411   HGB 11.3 (L) 03/23/2021 0727   HGB 12.4 04/29/2018 1235   HCT 36.3 11/22/2021 1411   HCT 37.2 04/29/2018 1235   PLT 459 (H) 11/22/2021 1411   PLT 341 03/23/2021 0727   PLT 389 04/29/2018 1235   MCV 84.0 11/22/2021 1411   MCV 86 04/29/2018 1235   MCH 28.2 11/22/2021 1411   MCHC 33.6 11/22/2021 1411   RDW 13.7 11/22/2021 1411   RDW 13.4 04/29/2018 1235   LYMPHSABS 2.0 11/22/2021 1411   MONOABS 0.3 11/22/2021 1411   EOSABS 0.1 11/22/2021 1411   BASOSABS 0.0 11/22/2021 1411    BMET    Component Value Date/Time   NA 142 11/22/2021 1411   NA 142 03/09/2021 1024   K 3.9 11/22/2021 1411   CL 109 11/22/2021 1411   CO2 25  11/22/2021 1411   GLUCOSE 92 11/22/2021 1411   BUN 13 11/22/2021 1411   BUN 14 03/09/2021 1024   CREATININE 0.81 11/22/2021 1411   CREATININE 0.92 03/23/2021 0727   CREATININE 1.21 (H) 07/26/2016 1528   CALCIUM 9.1 11/22/2021 1411   GFRNONAA >60 11/22/2021 1411   GFRNONAA >60 03/23/2021 0727   GFRAA 79 10/23/2019 0924    BNP    Component Value Date/Time   BNP 216.3 (H) 11/22/2021 1402    ProBNP No results found for: "PROBNP"  Imaging: CT Head Wo Contrast  Result Date: 11/22/2021 CLINICAL DATA:  Headache, dizziness, hypertension EXAM: CT HEAD WITHOUT CONTRAST TECHNIQUE: Contiguous axial images were obtained from the base of the skull through the vertex without intravenous contrast. RADIATION DOSE REDUCTION: This exam was performed according to the departmental dose-optimization program which includes automated exposure control, adjustment of the mA and/or kV according to patient size and/or use of iterative reconstruction technique. COMPARISON:  09/29/2019 FINDINGS: Brain: Minimal atrophy. Normal ventricular morphology. No midline shift or mass effect. Small vessel chronic ischemic changes of deep cerebral white matter. Old lacunar infarcts central pons, RIGHT caudate body, LEFT basal ganglia. No intracranial hemorrhage, mass lesion, or evidence of acute infarction. No extra-axial fluid collections. Vascular: Atherosclerotic calcification of internal carotid arteries at skull base Skull: Intact Sinuses/Orbits: Clear Other: N/A IMPRESSION: Atrophy with small vessel chronic ischemic changes of deep cerebral white matter. Old lacunar infarcts as above. No acute intracranial abnormalities. Electronically Signed   By: Lavonia Dana M.D.   On: 11/22/2021 15:20   DG Chest 1 View  Result Date: 11/22/2021 CLINICAL DATA:  Shortness of breath.  Headache. EXAM: CHEST  1 VIEW COMPARISON:  05/12/2021 and 03/16/2021 FINDINGS: Moderate enlargement of the cardiopericardial silhouette with interstitial  accentuation, left greater than right, potentially reflecting mildly asymmetric interstitial edema. No well-defined Kerley B lines. No blunting of the costophrenic angles. No discrete airspace opacity. IMPRESSION: 1. Moderate enlargement of the cardiopericardial silhouette. 2. Mildly asymmetric interstitial accentuation, left greater than right, suggesting asymmetric interstitial edema or possibly atypical pneumonia. Electronically Signed   By: Cindra Eves.D.  On: 11/22/2021 14:36     Assessment & Plan:   No problem-specific Assessment & Plan notes found for this encounter.     Martyn Ehrich, NP 12/11/2021

## 2021-12-13 ENCOUNTER — Encounter: Payer: Self-pay | Admitting: Primary Care

## 2021-12-13 ENCOUNTER — Ambulatory Visit (INDEPENDENT_AMBULATORY_CARE_PROVIDER_SITE_OTHER): Payer: Medicare Other | Admitting: Primary Care

## 2021-12-13 DIAGNOSIS — Z720 Tobacco use: Secondary | ICD-10-CM

## 2021-12-13 DIAGNOSIS — J849 Interstitial pulmonary disease, unspecified: Secondary | ICD-10-CM

## 2021-12-13 DIAGNOSIS — I5032 Chronic diastolic (congestive) heart failure: Secondary | ICD-10-CM | POA: Diagnosis not present

## 2021-12-13 DIAGNOSIS — G4733 Obstructive sleep apnea (adult) (pediatric): Secondary | ICD-10-CM

## 2021-12-13 DIAGNOSIS — J439 Emphysema, unspecified: Secondary | ICD-10-CM

## 2021-12-13 DIAGNOSIS — J9611 Chronic respiratory failure with hypoxia: Secondary | ICD-10-CM

## 2021-12-13 NOTE — Patient Instructions (Signed)
Recommendations - Continue Trelegy 177mg 1 puff daily in the morning - Use Ventolin inhaler OR nebulizer 4-6 hours for breakthrough shortness of breath or wheezing - Continue to wear CPAP every night, aim to get 4 to 6 hours or longer of use every night - Please make sure you take 2 tablets of Torsemide 20 mg daily - Please make sure you weigh yourself daily every morning  Follow-up: - 2 to 3 months with Dr. AElsworth Sohoor sooner if needed

## 2021-12-13 NOTE — Assessment & Plan Note (Signed)
-   Current smoker, she is working on tapering amt and down to 4-5 cigarettes a day. Using nicotrol inhaler. Continue to encourage smoking cessation efforts.

## 2021-12-13 NOTE — Assessment & Plan Note (Addendum)
-   Image findings suggestive of alternative diagnosis. Hypersensitivity pneumonitis panel was negative. She is not a good candidate for bronchoscopy.

## 2021-12-13 NOTE — Assessment & Plan Note (Addendum)
-   Stable; Last exacerbation was in August 2023, she did not require hospitalization - Continue Trelegy 126mg one puff daily and prn ipratropium-albuterol nebulizer every 4-6 hours as needed for breakthrough shortness of breath/wheezing

## 2021-12-13 NOTE — Assessment & Plan Note (Addendum)
-   No overt signs of fluid overload. She has baseline +4 BLE edema.  - Advised she take diuretics as prescribed and monitor weights daily

## 2021-12-13 NOTE — Assessment & Plan Note (Addendum)
-   Continue supplemental oxygen 2-3L with moderate exertion and at bedtime, goal maintain O2 >88-90%

## 2021-12-13 NOTE — Assessment & Plan Note (Signed)
-   Hx moderate OSA, AHI 20/hour. She is compliant with CPAP, average usage 4 hours. Pressure 5-20cm h20 (7.1cm h20-95%); Residual AHI 0.2/hour. No changes today. Continue to encourage patient wear CPAP every night min 4-6 hours or longer.

## 2021-12-13 NOTE — Assessment & Plan Note (Signed)
>>  ASSESSMENT AND PLAN FOR DIASTOLIC HEART FAILURE (HCC) WRITTEN ON 12/13/2021 11:17 AM BY WALSH, Earnstine Regal, NP  - No overt signs of fluid overload. She has baseline +4 BLE edema.  - Advised she take diuretics as prescribed and monitor weights daily

## 2021-12-15 ENCOUNTER — Other Ambulatory Visit: Payer: Self-pay

## 2021-12-15 DIAGNOSIS — I5032 Chronic diastolic (congestive) heart failure: Secondary | ICD-10-CM

## 2021-12-16 ENCOUNTER — Ambulatory Visit (INDEPENDENT_AMBULATORY_CARE_PROVIDER_SITE_OTHER): Payer: Medicare Other | Admitting: Internal Medicine

## 2021-12-16 ENCOUNTER — Encounter: Payer: Self-pay | Admitting: Internal Medicine

## 2021-12-16 ENCOUNTER — Other Ambulatory Visit: Payer: Self-pay

## 2021-12-16 VITALS — BP 133/88 | HR 61 | Temp 97.7°F | Ht 74.0 in

## 2021-12-16 DIAGNOSIS — I10 Essential (primary) hypertension: Secondary | ICD-10-CM | POA: Diagnosis not present

## 2021-12-16 DIAGNOSIS — J439 Emphysema, unspecified: Secondary | ICD-10-CM

## 2021-12-16 DIAGNOSIS — F17210 Nicotine dependence, cigarettes, uncomplicated: Secondary | ICD-10-CM | POA: Diagnosis not present

## 2021-12-16 DIAGNOSIS — I639 Cerebral infarction, unspecified: Secondary | ICD-10-CM | POA: Diagnosis not present

## 2021-12-16 NOTE — Patient Instructions (Addendum)
Thank you, Ms.[name] for allowing [place] to provide your care today.   Blood pressure Your blood pressure looked good today. Continue taking losartan 50 mg, metoprolol 25 mg, and Imdur 10 mg.  Breathing Thank you for seeing the lung doctor. Continue using trelegy inhaler.  Pre-diabetes Continue same dose, we can increase dose at follow-up if you are doing well with it.   I have ordered the following medication/changed the following medications:   Stop the following medications: There are no discontinued medications.   Start the following medications: No orders of the defined types were placed in this encounter.    Follow up: 2 months    We look forward to seeing you next time. Please call our clinic at [phone] if you have any questions or concerns. The best time to call is Monday-Friday from 9am-4pm, but there is someone available 24/7. If after hours or the weekend, call the main hospital number and ask for the Internal Medicine Resident On-Call. If you need medication refills, please notify your pharmacy one week in advance and they will send [place] a request.   Thank you for trusting me with your care. Wishing you the best!   Christiana Fuchs, [place]

## 2021-12-16 NOTE — Progress Notes (Addendum)
Subjective:  CC: follow-up on blood pressure from 2 weeks ago  HPI:  Sharon Vasquez is a 71 y.o. female with a past medical history stated below and presents today for HTN follow-up and she remains interested in personal care services. Please see problem based assessment and plan for additional details.  Past Medical History:  Diagnosis Date   A-fib [facility])    Anxiety    Arthritis    "qwhre; joints, back" (04/17/2017)   Benign breast cyst in female, left 01/08/2017   Found by Screening mammogram, evaluated by U/S on 01/08/17 and determined to be a benign simple breast cyst.   Breast cancer (HCC)    Cellulitis of left lower leg 05/30/2017   CHF (congestive heart failure) (HCC)    Chronic low back pain 08/21/2016   Chronic lower back pain    Chronic venous insufficiency    /notes 05/30/2017   COPD (chronic obstructive pulmonary disease) (HCC)    Depression    Diabetes mellitus without complication (HCC)    DVT (deep venous thrombosis) (HCC) 11/16/2016   Dysrhythmia    GERD (gastroesophageal reflux disease)    Headache    "weekly for the last 3 months" (04/17/2017)   Hyperlipidemia    Hypertension    Morbid obesity (HCC)    PE (pulmonary embolism)    Pulmonary embolism (HCC) 09/21/2014   Sleep apnea     Current Outpatient Medications on File Prior to Visit  Medication Sig Dispense Refill   nicotine (NICOTROL) 10 MG inhaler Inhale 1 continuous puffing into the lungs as needed.     albuterol (VENTOLIN HFA) 108 (90 Base) MCG/ACT inhaler Inhale 2 puffs into the lungs every 4 (four) hours as needed for shortness of breath. 18 g 0   aspirin 81 MG chewable tablet Chew 81 mg by mouth daily.     atorvastatin (LIPITOR) 80 MG tablet TAKE 1 TABLET BY MOUTH EVERY DAY 90 tablet 3   esomeprazole (NEXIUM) 40 MG capsule Take 1 capsule (40 mg total) by mouth daily as needed (for heartburn or indigestion). (Patient taking differently: Take 40 mg by mouth daily.) 90 capsule 1    Fluticasone-Umeclidin-Vilant (TRELEGY ELLIPTA) 100-62.5-25 MCG/ACT AEPB Inhale 1 puff into the lungs daily. 1 each 0   gabapentin (NEURONTIN) 400 MG capsule TAKE 1 CAPSULE BY MOUTH  TWICE DAILY 200 capsule 2   ipratropium-albuterol (DUONEB) 0.5-2.5 (3) MG/3ML SOLN Take 3 mLs by nebulization every 6 (six) hours as needed. 360 mL 3   isosorbide mononitrate (IMDUR) 30 MG 24 hr tablet TAKE 1 TABLET(30 MG) BY MOUTH DAILY 90 tablet 1   letrozole (FEMARA) 2.5 MG tablet TAKE 1 TABLET(2.5 MG) BY MOUTH DAILY 90 tablet 3   losartan (COZAAR) 50 MG tablet Take 1 tablet (50 mg total) by mouth daily. 30 tablet 11   metoprolol succinate (TOPROL XL) 25 MG 24 hr tablet Take 1 tablet (25 mg total) by mouth daily. 90 tablet 1   mirabegron ER (MYRBETRIQ) 50 MG TB24 tablet Take 1 tablet (50 mg total) by mouth daily. 90 tablet 1   oxyCODONE (ROXICODONE) 15 MG immediate release tablet Take 15 mg by mouth every 6 (six) hours as needed for pain.     OXYGEN Inhale 2-3 L into the lungs daily.     oxymetazoline (AFRIN) 0.05 % nasal spray Place 1 spray into both nostrils 2 (two) times daily as needed for congestion.     potassium chloride SA (KLOR-CON M) 20 MEQ tablet Take 1  tablet (20 mEq total) by mouth daily. 30 tablet 0   PRESCRIPTION MEDICATION Inhale into the lungs at bedtime. cpap     Semaglutide,0.25 or 0.5MG /DOS, 2 MG/3ML SOPN Inject 0.5 Units into the skin once a week. 3 mL 2   torsemide (DEMADEX) 20 MG tablet Take 2 tablets by mouth daily, you may take an extra 1/2 tablet only as needed for weight gain of 2 lbs overnight or 5 lbs in a week 225 tablet 1   TRELEGY ELLIPTA 100-62.5-25 MCG/ACT AEPB INHALE 1 PUFF INTO THE LUNGS DAILY 60 each 5   XARELTO 20 MG TABS tablet TAKE 1 TABLET(20 MG) BY MOUTH EVERY MORNING 90 tablet 3   No current facility-administered medications on file prior to visit.    Family History  Problem Relation Age of Onset   Breast cancer Mother        before age 91   Hypertension Mother     Hyperlipidemia Mother    Hypertension Maternal Grandfather    Hyperlipidemia Maternal Grandfather     Social History   Socioeconomic History   Marital status: Divorced    Spouse name: Not on file   Number of children: Not on file   Years of education: Not on file   Highest education level: Not on file  Occupational History   Not on file  Tobacco Use   Smoking status: Some Days    Packs/day: 2.00    Years: 55.00    Total pack years: 110.00    Types: Cigarettes    Start date: 08/16/1961   Smokeless tobacco: Never   Tobacco comments:    Pt smokes about 4 ciggs daily  Vaping Use   Vaping Use: Never used  Substance and Sexual Activity   Alcohol use: No   Drug use: No   Sexual activity: Not Currently    Partners: Male  Other Topics Concern   Not on file  Social History Narrative   Lives in [facility] in [place]. Lives alone, but is dependent in ADLs/IADLs. Previously had HH PT and RN but dismissed them with plans to use the [employer]. Two daughters live nearby. Previously resided in [place].   Social Determinants of Health   Financial Resource Strain: Medium Risk (12/02/2021)   Overall Financial Resource Strain (CARDIA)    Difficulty of Paying Living Expenses: Somewhat hard  Food Insecurity: No Food Insecurity (12/02/2021)   Hunger Vital Sign    Worried About Running Out of Food in the Last Year: Never true    Ran Out of Food in the Last Year: Never true  Transportation Needs: No Transportation Needs (12/02/2021)   PRAPARE - Administrator, Civil Service (Medical): No    Lack of Transportation (Non-Medical): No  Physical Activity: Inactive (12/02/2021)   Exercise Vital Sign    Days of Exercise per Week: 0 days    Minutes of Exercise per Session: 0 min  Stress: No Stress Concern Present (12/02/2021)   [employer] of Occupational Health - Occupational Stress Questionnaire    Feeling of Stress : Only a little  Social Connections: Moderately Integrated  (12/02/2021)   Social Connection and Isolation Panel [NHANES]    Frequency of Communication with Friends and Family: More than three times a week    Frequency of Social Gatherings with Friends and Family: More than three times a week    Attends Religious Services: More than 4 times per year    Active Member of [employer] or Organizations:  Yes    Attends Club or Organization Meetings: More than 4 times per year    Marital Status: Divorced  Intimate Partner Violence: Not At Risk (12/02/2021)   Humiliation, Afraid, Rape, and Kick questionnaire    Fear of Current or Ex-Partner: No    Emotionally Abused: No    Physically Abused: No    Sexually Abused: No    Review of Systems: ROS negative except for what is noted on the assessment and plan.  Objective:   Vitals:   12/16/21 0911 12/16/21 0918  BP: (!) 162/86 133/88  Pulse: 87 61  Temp: 97.7 F (36.5 C)   TempSrc: Oral   SpO2: 93%   Height: 6\' 2"  (1.88 m)     Physical Exam: Constitutional: chronically ill appearing, sitting in wheelchair, in no acute distress Cardiovascular: regular rate and rhythm, no m/r/g Pulmonary/Chest: normal work of breathing on room air, lungs clear to auscultation bilaterally Abdominal: soft, non-tender, non-distended MSK: skin on lower extremities bilaterally with 3+ non pitting edema, thickened hardened, no open wounds Neurological: alert & oriented x 3 Skin: warm and dry   Assessment & Plan:  Benign essential HTN Blood pressure follow-up. Losartan was increased from 25-50 mg at last office vist. She continues to take metoprolol 25 mg and imdur 10 mg. She reports adherence with medication. BP initially elevated at 162/86 then decreased to 133/88.  A/P: BP at goal. Continue medications -losartan 50 mg -Imdur 30 mg -Metoprolol 25 mg qd  Stroke [facility]) She would greatly benefit from additional support at home. She is working on moving to a handicap apartment. Her daughters live close by and work full time.  She has difficulty with standing and uses motorized wheelchair. She had difficulty dressing and cooking for herself. A/P: Medicare does not cover PCS. She will need to apply for medicaid to get PCS or CAP support.  -referral to social work   Patient discussed with Dr. Percell Boston Zahriah Roes, D.O. [facility] Internal Medicine  PGY-2 Pager: [phone]  Phone: [phone] Date 12/20/2021  Time 7:09 AM

## 2021-12-17 NOTE — Assessment & Plan Note (Signed)
Blood pressure follow-up. Losartan was increased from 25-50 mg at last office vist. She continues to take metoprolol 25 mg and imdur 10 mg. She reports adherence with medication. BP initially elevated at 162/86 then decreased to 133/88.  A/P: BP at goal. Continue medications -losartan 50 mg -Imdur 30 mg -Metoprolol 25 mg qd

## 2021-12-17 NOTE — Assessment & Plan Note (Signed)
She would greatly benefit from additional support at home. She is working on moving to a handicap apartment. Her daughters live close by and work full time. She has difficulty with standing and uses motorized wheelchair. She had difficulty dressing and cooking for herself. A/P: Medicare does not cover PCS. She will need to apply for medicaid to get PCS or CAP support.  -referral to social work

## 2021-12-18 DIAGNOSIS — J449 Chronic obstructive pulmonary disease, unspecified: Secondary | ICD-10-CM | POA: Diagnosis not present

## 2021-12-19 ENCOUNTER — Telehealth: Payer: Self-pay

## 2021-12-19 ENCOUNTER — Other Ambulatory Visit: Payer: Self-pay

## 2021-12-19 NOTE — Telephone Encounter (Signed)
   Telephone encounter was:  Successful.  12/19/2021 Name: Sharon Vasquez [name]: [id number] DOB: [birth year]  Sharon Vasquez is a 71 y.o. year old female who is a primary care patient of [place], Rayann N, DO . The community resource team was consulted for assistance with  Medicaid, PCS and CAP programs.  Care guide performed the following interventions: Spoke with patient verified home address, per patient request mailing information for Medicaid, PCS and CAP programs. Patient has my name and number is she does not receive resources in the next 7-10 business days.  Follow Up Plan:  No further follow up planned at this time. The patient has been provided with needed resources.  [place] Resource Care Guide   [name]  ?? [phone]   Website: [email or web address]  [name]  "We don't say no, we SHOW how!"         [facility]

## 2021-12-20 MED ORDER — ISOSORBIDE MONONITRATE ER 30 MG PO TB24: ORAL_TABLET | ORAL | 1 refills | Status: DC

## 2021-12-21 ENCOUNTER — Other Ambulatory Visit: Payer: Self-pay | Admitting: Internal Medicine

## 2021-12-21 DIAGNOSIS — I5032 Chronic diastolic (congestive) heart failure: Secondary | ICD-10-CM

## 2021-12-25 ENCOUNTER — Other Ambulatory Visit: Payer: Self-pay | Admitting: Internal Medicine

## 2021-12-25 DIAGNOSIS — I5032 Chronic diastolic (congestive) heart failure: Secondary | ICD-10-CM

## 2021-12-26 NOTE — Progress Notes (Signed)
Internal Medicine Clinic Attending  Case discussed with Dr. Masters  at the time of the visit.  We reviewed the resident's history and exam and pertinent patient test results.  I agree with the assessment, diagnosis, and plan of care documented in the resident's note.  

## 2021-12-27 ENCOUNTER — Other Ambulatory Visit: Payer: Self-pay | Admitting: Internal Medicine

## 2021-12-28 ENCOUNTER — Other Ambulatory Visit: Payer: Self-pay | Admitting: Internal Medicine

## 2021-12-28 DIAGNOSIS — E876 Hypokalemia: Secondary | ICD-10-CM

## 2021-12-28 DIAGNOSIS — I5032 Chronic diastolic (congestive) heart failure: Secondary | ICD-10-CM

## 2021-12-30 DIAGNOSIS — I509 Heart failure, unspecified: Secondary | ICD-10-CM | POA: Diagnosis not present

## 2021-12-30 DIAGNOSIS — G8929 Other chronic pain: Secondary | ICD-10-CM | POA: Diagnosis not present

## 2021-12-30 DIAGNOSIS — I4891 Unspecified atrial fibrillation: Secondary | ICD-10-CM | POA: Diagnosis not present

## 2021-12-30 DIAGNOSIS — Z79899 Other long term (current) drug therapy: Secondary | ICD-10-CM | POA: Diagnosis not present

## 2021-12-30 DIAGNOSIS — M25561 Pain in right knee: Secondary | ICD-10-CM | POA: Diagnosis not present

## 2021-12-30 DIAGNOSIS — E119 Type 2 diabetes mellitus without complications: Secondary | ICD-10-CM | POA: Diagnosis not present

## 2021-12-30 DIAGNOSIS — M25562 Pain in left knee: Secondary | ICD-10-CM | POA: Diagnosis not present

## 2022-01-02 ENCOUNTER — Other Ambulatory Visit: Payer: Self-pay

## 2022-01-02 NOTE — Addendum Note (Signed)
Addended by: Truddie Crumble on: 01/02/2022 02:01 PM   Modules accepted: Orders

## 2022-01-03 ENCOUNTER — Other Ambulatory Visit (INDEPENDENT_AMBULATORY_CARE_PROVIDER_SITE_OTHER): Payer: Medicare Other

## 2022-01-03 DIAGNOSIS — I5032 Chronic diastolic (congestive) heart failure: Secondary | ICD-10-CM | POA: Diagnosis not present

## 2022-01-03 DIAGNOSIS — E876 Hypokalemia: Secondary | ICD-10-CM | POA: Diagnosis not present

## 2022-01-03 NOTE — Addendum Note (Signed)
Addended by: Marcelino Duster on: 01/03/2022 10:50 AM   Modules accepted: Orders

## 2022-01-04 ENCOUNTER — Other Ambulatory Visit: Payer: Self-pay

## 2022-01-04 ENCOUNTER — Observation Stay (HOSPITAL_COMMUNITY)
Admission: EM | Admit: 2022-01-04 | Discharge: 2022-01-04 | Disposition: A | Discharge status: Home / Self Care | Payer: Medicare Other | Attending: Internal Medicine | Admitting: Internal Medicine

## 2022-01-04 ENCOUNTER — Encounter (HOSPITAL_COMMUNITY): Payer: Self-pay

## 2022-01-04 ENCOUNTER — Emergency Department (HOSPITAL_COMMUNITY): Payer: Medicare Other

## 2022-01-04 DIAGNOSIS — Z86718 Personal history of other venous thrombosis and embolism: Secondary | ICD-10-CM | POA: Insufficient documentation

## 2022-01-04 DIAGNOSIS — C50411 Malignant neoplasm of upper-outer quadrant of right female breast: Secondary | ICD-10-CM | POA: Insufficient documentation

## 2022-01-04 DIAGNOSIS — Z86711 Personal history of pulmonary embolism: Secondary | ICD-10-CM | POA: Diagnosis not present

## 2022-01-04 DIAGNOSIS — R0603 Acute respiratory distress: Secondary | ICD-10-CM | POA: Diagnosis not present

## 2022-01-04 DIAGNOSIS — I509 Heart failure, unspecified: Secondary | ICD-10-CM

## 2022-01-04 DIAGNOSIS — I4821 Permanent atrial fibrillation: Secondary | ICD-10-CM | POA: Diagnosis not present

## 2022-01-04 DIAGNOSIS — Z7982 Long term (current) use of aspirin: Secondary | ICD-10-CM | POA: Diagnosis not present

## 2022-01-04 DIAGNOSIS — R1011 Right upper quadrant pain: Secondary | ICD-10-CM | POA: Diagnosis not present

## 2022-01-04 DIAGNOSIS — J9601 Acute respiratory failure with hypoxia: Secondary | ICD-10-CM | POA: Diagnosis not present

## 2022-01-04 DIAGNOSIS — I499 Cardiac arrhythmia, unspecified: Secondary | ICD-10-CM | POA: Diagnosis not present

## 2022-01-04 DIAGNOSIS — G4733 Obstructive sleep apnea (adult) (pediatric): Secondary | ICD-10-CM | POA: Diagnosis present

## 2022-01-04 DIAGNOSIS — Z170 Estrogen receptor positive status [ER+]: Secondary | ICD-10-CM | POA: Diagnosis not present

## 2022-01-04 DIAGNOSIS — I5033 Acute on chronic diastolic (congestive) heart failure: Secondary | ICD-10-CM | POA: Insufficient documentation

## 2022-01-04 DIAGNOSIS — J9621 Acute and chronic respiratory failure with hypoxia: Principal | ICD-10-CM | POA: Diagnosis present

## 2022-01-04 DIAGNOSIS — I160 Hypertensive urgency: Secondary | ICD-10-CM

## 2022-01-04 DIAGNOSIS — Z20822 Contact with and (suspected) exposure to covid-19: Secondary | ICD-10-CM | POA: Diagnosis not present

## 2022-01-04 DIAGNOSIS — I4891 Unspecified atrial fibrillation: Secondary | ICD-10-CM | POA: Diagnosis not present

## 2022-01-04 DIAGNOSIS — E669 Obesity, unspecified: Secondary | ICD-10-CM | POA: Insufficient documentation

## 2022-01-04 DIAGNOSIS — Z7985 Long-term (current) use of injectable non-insulin antidiabetic drugs: Secondary | ICD-10-CM | POA: Diagnosis not present

## 2022-01-04 DIAGNOSIS — R0689 Other abnormalities of breathing: Secondary | ICD-10-CM | POA: Diagnosis not present

## 2022-01-04 DIAGNOSIS — F17210 Nicotine dependence, cigarettes, uncomplicated: Secondary | ICD-10-CM | POA: Diagnosis not present

## 2022-01-04 DIAGNOSIS — Z7901 Long term (current) use of anticoagulants: Secondary | ICD-10-CM | POA: Diagnosis not present

## 2022-01-04 DIAGNOSIS — I10 Essential (primary) hypertension: Secondary | ICD-10-CM | POA: Diagnosis present

## 2022-01-04 DIAGNOSIS — Z8709 Personal history of other diseases of the respiratory system: Secondary | ICD-10-CM

## 2022-01-04 DIAGNOSIS — Z79899 Other long term (current) drug therapy: Secondary | ICD-10-CM | POA: Insufficient documentation

## 2022-01-04 DIAGNOSIS — I110 Hypertensive heart disease with heart failure: Secondary | ICD-10-CM | POA: Insufficient documentation

## 2022-01-04 DIAGNOSIS — Z743 Need for continuous supervision: Secondary | ICD-10-CM | POA: Diagnosis not present

## 2022-01-04 DIAGNOSIS — E1140 Type 2 diabetes mellitus with diabetic neuropathy, unspecified: Secondary | ICD-10-CM | POA: Diagnosis not present

## 2022-01-04 DIAGNOSIS — R6889 Other general symptoms and signs: Secondary | ICD-10-CM | POA: Diagnosis not present

## 2022-01-04 DIAGNOSIS — J449 Chronic obstructive pulmonary disease, unspecified: Secondary | ICD-10-CM | POA: Diagnosis present

## 2022-01-04 HISTORY — DX: Acute and chronic respiratory failure with hypoxia: J96.21

## 2022-01-04 LAB — BASIC METABOLIC PANEL
Anion gap: 12 (ref 5–15)
Anion gap: 11 (ref 5–15)
BUN: 11 mg/dL (ref 8–23)
BUN: 16 mg/dL (ref 8–23)
CO2: 21 mmol/L — ABNORMAL LOW (ref 22–32)
CO2: 28 mmol/L (ref 22–32)
Calcium: 8.6 mg/dL — ABNORMAL LOW (ref 8.9–10.3)
Calcium: 9.3 mg/dL (ref 8.9–10.3)
Chloride: 108 mmol/L (ref 98–111)
Chloride: 102 mmol/L (ref 98–111)
Creatinine, Ser: 0.77 mg/dL (ref 0.44–1.00)
Creatinine, Ser: 1.28 mg/dL — ABNORMAL HIGH (ref 0.44–1.00)
GFR, Estimated: >60 mL/min (ref >60)
GFR, Estimated: 45 mL/min — ABNORMAL LOW (ref >60)
Glucose, Bld: 114 mg/dL — ABNORMAL HIGH (ref 70–99)
Glucose, Bld: 144 mg/dL — ABNORMAL HIGH (ref 70–99)
Potassium: 3.6 mmol/L (ref 3.5–5.1)
Potassium: 3.6 mmol/L (ref 3.5–5.1)
Sodium: 141 mmol/L (ref 135–145)
Sodium: 141 mmol/L (ref 135–145)

## 2022-01-04 LAB — BMP8+ANION GAP
Anion Gap: 6 mmol/L — ABNORMAL LOW (ref 10.0–18.0)
BUN/Creatinine Ratio: 17 (ref 12–28)
BUN: 14 mg/dL (ref 8–27)
CO2: 22 mmol/L (ref 20–29)
Calcium: 9.5 mg/dL (ref 8.7–10.3)
Chloride: 112 mmol/L — ABNORMAL HIGH (ref 96–106)
Creatinine, Ser: 0.81 mg/dL (ref 0.57–1.00)
Glucose: 93 mg/dL (ref 70–99)
Potassium: 4.1 mmol/L (ref 3.5–5.2)
Sodium: 140 mmol/L (ref 134–144)
eGFR: 77 mL/min/{1.73_m2} (ref >59)

## 2022-01-04 LAB — CBC WITH DIFFERENTIAL/PLATELET
Abs Immature Granulocytes: 0.03 10*3/uL (ref 0.00–0.07)
Basophils Absolute: 0 10*3/uL (ref 0.0–0.1)
Basophils Relative: 0 %
Eosinophils Absolute: 0 10*3/uL (ref 0.0–0.5)
Eosinophils Relative: 1 %
HCT: 38.2 % (ref 36.0–46.0)
Hemoglobin: 12.5 g/dL (ref 12.0–15.0)
Immature Granulocytes: 1 %
Lymphocytes Relative: 14 %
Lymphs Abs: 0.8 10*3/uL (ref 0.7–4.0)
MCH: 28.6 pg (ref 26.0–34.0)
MCHC: 32.7 g/dL (ref 30.0–36.0)
MCV: 87.4 fL (ref 80.0–100.0)
Monocytes Absolute: 0.2 10*3/uL (ref 0.1–1.0)
Monocytes Relative: 3 %
Neutro Abs: 4.8 10*3/uL (ref 1.7–7.7)
Neutrophils Relative %: 81 %
Platelets: 371 10*3/uL (ref 150–400)
RBC: 4.37 MIL/uL (ref 3.87–5.11)
RDW: 14.1 % (ref 11.5–15.5)
WBC: 5.9 10*3/uL (ref 4.0–10.5)
nRBC: 0 % (ref 0.0–0.2)

## 2022-01-04 LAB — URINALYSIS, ROUTINE W REFLEX MICROSCOPIC
Bilirubin Urine: NEGATIVE
Glucose, UA: NEGATIVE mg/dL
Hgb urine dipstick: NEGATIVE
Ketones, ur: NEGATIVE mg/dL
Leukocytes,Ua: NEGATIVE
Nitrite: NEGATIVE
Protein, ur: NEGATIVE mg/dL
Specific Gravity, Urine: 1.005 (ref 1.005–1.030)
pH: 6 (ref 5.0–8.0)

## 2022-01-04 LAB — BRAIN NATRIURETIC PEPTIDE: B Natriuretic Peptide: 174.3 pg/mL — ABNORMAL HIGH (ref 0.0–100.0)

## 2022-01-04 LAB — SARS CORONAVIRUS 2 BY RT PCR: SARS Coronavirus 2 by RT PCR: NEGATIVE

## 2022-01-04 LAB — MAGNESIUM
Magnesium: 1.4 mg/dL — ABNORMAL LOW (ref 1.7–2.4)
Magnesium: 2.6 mg/dL — ABNORMAL HIGH (ref 1.7–2.4)

## 2022-01-04 MED ORDER — FUROSEMIDE 10 MG/ML IJ SOLN
80.0000 mg | Freq: Once | INTRAMUSCULAR | Status: AC
  Administered 2022-01-04: 80 mg via INTRAVENOUS
  Filled 2022-01-04: qty 8

## 2022-01-04 MED ORDER — SENNOSIDES-DOCUSATE SODIUM 8.6-50 MG PO TABS: 1.0000 | ORAL_TABLET | Freq: Every evening | ORAL | Status: DC | PRN

## 2022-01-04 MED ORDER — MAGNESIUM SULFATE 4 GM/100ML IV SOLN
4.0000 g | Freq: Once | INTRAVENOUS | Status: AC
  Administered 2022-01-04: 4 g via INTRAVENOUS
  Filled 2022-01-04: qty 100

## 2022-01-04 MED ORDER — ATORVASTATIN CALCIUM 80 MG PO TABS
80.0000 mg | ORAL_TABLET | Freq: Every day | ORAL | Status: DC
  Administered 2022-01-04: 80 mg via ORAL
  Filled 2022-01-04: qty 1

## 2022-01-04 MED ORDER — IPRATROPIUM-ALBUTEROL 0.5-2.5 (3) MG/3ML IN SOLN
3.0000 mL | Freq: Four times a day (QID) | RESPIRATORY_TRACT | Status: DC
  Administered 2022-01-04: 3 mL via RESPIRATORY_TRACT
  Filled 2022-01-04 (×3): qty 3

## 2022-01-04 MED ORDER — LETROZOLE 2.5 MG PO TABS
2.5000 mg | ORAL_TABLET | Freq: Every day | ORAL | Status: DC
  Administered 2022-01-04: 2.5 mg via ORAL
  Filled 2022-01-04: qty 1

## 2022-01-04 MED ORDER — RIVAROXABAN 10 MG PO TABS
20.0000 mg | ORAL_TABLET | Freq: Every morning | ORAL | Status: DC
  Administered 2022-01-04: 20 mg via ORAL
  Filled 2022-01-04: qty 2

## 2022-01-04 MED ORDER — POTASSIUM CHLORIDE CRYS ER 20 MEQ PO TBCR
40.0000 meq | EXTENDED_RELEASE_TABLET | Freq: Every day | ORAL | Status: DC
  Administered 2022-01-04: 40 meq via ORAL
  Filled 2022-01-04: qty 2

## 2022-01-04 MED ORDER — NITROGLYCERIN IN D5W 200-5 MCG/ML-% IV SOLN
0.0000 ug/min | INTRAVENOUS | Status: DC
  Administered 2022-01-04: 5 ug/min via INTRAVENOUS
  Filled 2022-01-04: qty 250

## 2022-01-04 MED ORDER — FUROSEMIDE 10 MG/ML IJ SOLN: 80.0000 mg | Freq: Once | INTRAMUSCULAR | Status: DC

## 2022-01-04 MED ORDER — ASPIRIN 81 MG PO CHEW
81.0000 mg | CHEWABLE_TABLET | Freq: Every day | ORAL | Status: DC
  Administered 2022-01-04: 81 mg via ORAL
  Filled 2022-01-04: qty 1

## 2022-01-04 MED ORDER — METOPROLOL SUCCINATE ER 25 MG PO TB24
25.0000 mg | ORAL_TABLET | Freq: Every day | ORAL | Status: DC
  Administered 2022-01-04: 25 mg via ORAL
  Filled 2022-01-04: qty 1

## 2022-01-04 MED ORDER — GABAPENTIN 400 MG PO CAPS: 400.0000 mg | ORAL_CAPSULE | Freq: Every day | ORAL | Status: DC

## 2022-01-04 MED ORDER — OXYCODONE HCL 5 MG PO TABS
15.0000 mg | ORAL_TABLET | Freq: Four times a day (QID) | ORAL | Status: DC | PRN
  Administered 2022-01-04: 15 mg via ORAL
  Filled 2022-01-04: qty 3

## 2022-01-04 MED ORDER — UMECLIDINIUM BROMIDE 62.5 MCG/ACT IN AEPB
1.0000 | INHALATION_SPRAY | Freq: Every day | RESPIRATORY_TRACT | Status: DC
  Filled 2022-01-04: qty 7

## 2022-01-04 MED ORDER — ONDANSETRON HCL 4 MG PO TABS: 4.0000 mg | ORAL_TABLET | Freq: Four times a day (QID) | ORAL | Status: DC | PRN

## 2022-01-04 MED ORDER — FLUTICASONE FUROATE-VILANTEROL 100-25 MCG/ACT IN AEPB
1.0000 | INHALATION_SPRAY | Freq: Every day | RESPIRATORY_TRACT | Status: DC
  Filled 2022-01-04: qty 28

## 2022-01-04 MED ORDER — METOLAZONE 5 MG PO TABS
5.0000 mg | ORAL_TABLET | Freq: Once | ORAL | Status: AC
  Administered 2022-01-04: 5 mg via ORAL
  Filled 2022-01-04: qty 1

## 2022-01-04 MED ORDER — ONDANSETRON HCL 4 MG/2ML IJ SOLN: 4.0000 mg | Freq: Four times a day (QID) | INTRAMUSCULAR | Status: DC | PRN

## 2022-01-04 MED ORDER — ISOSORBIDE MONONITRATE ER 30 MG PO TB24
30.0000 mg | ORAL_TABLET | Freq: Every day | ORAL | Status: DC
  Administered 2022-01-04: 30 mg via ORAL
  Filled 2022-01-04: qty 1

## 2022-01-04 MED ORDER — IPRATROPIUM-ALBUTEROL 0.5-2.5 (3) MG/3ML IN SOLN: 3.0000 mL | Freq: Four times a day (QID) | RESPIRATORY_TRACT | Status: DC | PRN

## 2022-01-04 MED ORDER — ACETAMINOPHEN 325 MG PO TABS: 650.0000 mg | ORAL_TABLET | Freq: Four times a day (QID) | ORAL | Status: DC | PRN

## 2022-01-04 MED ORDER — LOSARTAN POTASSIUM 50 MG PO TABS
50.0000 mg | ORAL_TABLET | Freq: Every day | ORAL | Status: DC
  Administered 2022-01-04: 50 mg via ORAL
  Filled 2022-01-04: qty 1

## 2022-01-04 MED ORDER — ACETAMINOPHEN 650 MG RE SUPP: 650.0000 mg | Freq: Four times a day (QID) | RECTAL | Status: DC | PRN

## 2022-01-04 MED ORDER — IPRATROPIUM-ALBUTEROL 0.5-2.5 (3) MG/3ML IN SOLN
3.0000 mL | Freq: Once | RESPIRATORY_TRACT | Status: AC
  Administered 2022-01-04: 3 mL via RESPIRATORY_TRACT
  Filled 2022-01-04: qty 3

## 2022-01-04 MED ORDER — PANTOPRAZOLE SODIUM 40 MG PO TBEC
40.0000 mg | DELAYED_RELEASE_TABLET | Freq: Every day | ORAL | Status: DC
  Administered 2022-01-04: 40 mg via ORAL
  Filled 2022-01-04: qty 1

## 2022-01-04 NOTE — ED Triage Notes (Signed)
Pt from home c/o resp distress. 22 G PIV LAC 2 albuterol tx at home. DuoneB and solumedrol with ems. Ems placed pt on cpap. Hr 102 bp 220/102 spo2 100% cpap

## 2022-01-04 NOTE — Discharge Summary (Signed)
Name: Sharon Vasquez [name]: [id number] DOB: 07-Aug-1950 71 y.o. PCP: [name], DO  Date of Admission: 01/04/2022  2:07 AM Date of Discharge: 01/04/2022 Attending Physician: Lucious Groves, DO  Discharge Diagnosis: Principal Problem:   Acute on chronic hypoxic respiratory failure ([place]) Active Problems:   Benign essential HTN   COPD (chronic obstructive pulmonary disease) (HCC)   Atrial fibrillation with controlled ventricular response (HCC)   OSA (obstructive sleep apnea)   Acute exacerbation of congestive heart failure (HCC)   Malignant neoplasm of upper-outer quadrant of right breast in female, estrogen receptor positive ([place])     Discharge Medications: Allergies as of 01/04/2022   No Known Allergies      Medication List     TAKE these medications    albuterol 108 (90 Base) MCG/ACT inhaler Commonly known as: VENTOLIN HFA Inhale 2 puffs into the lungs every 4 (four) hours as needed for shortness of breath.   aspirin 81 MG chewable tablet Chew 81 mg by mouth daily.   atorvastatin 80 MG tablet Commonly known as: LIPITOR TAKE 1 TABLET BY MOUTH EVERY DAY What changed:  how much to take how to take this when to take this additional instructions   esomeprazole 40 MG capsule Commonly known as: NEXIUM Take 1 capsule (40 mg total) by mouth daily as needed (for heartburn or indigestion). What changed: when to take this   gabapentin 400 MG capsule Commonly known as: NEURONTIN TAKE 1 CAPSULE BY MOUTH  TWICE DAILY What changed: when to take this   ipratropium-albuterol 0.5-2.5 (3) MG/3ML Soln Commonly known as: DUONEB Take 3 mLs by nebulization every 6 (six) hours as needed.   isosorbide mononitrate 30 MG 24 hr tablet Commonly known as: IMDUR TAKE 1 TABLET(30 MG) BY MOUTH DAILY What changed: See the new instructions.   letrozole 2.5 MG tablet Commonly known as: FEMARA TAKE 1 TABLET(2.5 MG) BY MOUTH DAILY What changed:  how much to take how to take  this when to take this   losartan 50 MG tablet Commonly known as: Cozaar Take 1 tablet (50 mg total) by mouth daily.   metoprolol succinate 25 MG 24 hr tablet Commonly known as: Toprol XL Take 1 tablet (25 mg total) by mouth daily.   mirabegron ER 50 MG Tb24 tablet Commonly known as: MYRBETRIQ Take 1 tablet (50 mg total) by mouth daily.   naloxone 4 MG/0.1ML Liqd nasal spray kit Commonly known as: NARCAN Place 1 spray into the nose once as needed. for opioid overdose   nicotine 10 MG inhaler Commonly known as: NICOTROL Inhale 1 continuous puffing into the lungs as needed.   oxyCODONE 15 MG immediate release tablet Commonly known as: ROXICODONE Take 15 mg by mouth every 6 (six) hours as needed for pain.   OXYGEN Inhale 2-3 L into the lungs daily.   potassium chloride SA 20 MEQ tablet Commonly known as: KLOR-CON M TAKE 1 TABLET BY MOUTH DAILY   PRESCRIPTION MEDICATION Inhale into the lungs at bedtime. cpap   Semaglutide(0.25 or 0.5MG/DOS) 2 MG/3ML Sopn Inject 0.5 Units into the skin once a week. What changed: additional instructions   torsemide 20 MG tablet Commonly known as: DEMADEX Take 2 tablets by mouth daily, you may take an extra 1/2 tablet only as needed for weight gain of 2 lbs overnight or 5 lbs in a week   Trelegy Ellipta 100-62.5-25 MCG/ACT Aepb Generic drug: Fluticasone-Umeclidin-Vilant Inhale 1 puff into the lungs daily.   Trelegy Ellipta 100-62.5-25 MCG/ACT  Aepb Generic drug: Fluticasone-Umeclidin-Vilant INHALE 1 PUFF INTO THE LUNGS DAILY   Xarelto 20 MG Tabs tablet Generic drug: rivaroxaban TAKE 1 TABLET(20 MG) BY MOUTH EVERY MORNING What changed: See the new instructions.        Disposition and follow-up:   Sharon Vasquez was discharged from [facility] in Stable condition.  At the hospital follow up visit please address:  1.   Acute on chronic HFpEF exacerbation Patient developed an AKI with aggressive IV diuresis.   Patient however decided to leave before completing medical treatment to attend an important event. -Recheck BMP -Assess adherence to torsemide 40 mg daily -Make sure patient is taking potassium supplement as well -Dry weight maybe at 366 lbs. -It is a difficult situation for her in the setting of taking diuretic and urinary incontinence.  We talked about the importance of adherence to torsemide to avoid pulmonary edema. Patient does not want to take torsemide due to incontinence when she goes out socially. She is taking Myrbetriq with some relief.  I do not have a great solution for her.  Consider follow-up with urology for treatment of her urinary incontinence.  Hypertension -Recheck blood pressure at follow-up -Ensure adherence to her blood pressure medications.  2.  Labs / imaging needed at time of follow-up: BMP  3.  Pending labs/ test needing follow-up: none   Follow-up Appointments:  Internal Medicine Clinic, next week  Hospital Course by problem list: 71 yo person living with a history of COPD/ILD, OSA, diastolic HF, HTN, permanent atrial fibrillation rate controlled, DM admitted for acute on chronic respiratory failure, secondary to HFpEF exacerbation in the setting of torsemide nonadherence.  Acute on chronic hypoxic respiratory failure Diastolic heart failure Hypertension Patient presented to the emergency room for worsening shortness of breath.  Patient has not been taking her torsemide consistently because it makes her incontinent which prevent her from working in her community.  She only takes her torsemide 2-3 times a week.  Patient report progressive dyspnea that requiring her to use oxygen supplement more often.  In the ED, patient was given Solu-Medrol, DuoNebs and placed on BiPAP.  Her blood pressure was 220/102 with mildly elevated BNP of 174.  Chest x-ray showed pulm vascular congestion without evidence of pneumonia.  Patient was given 1 dose of IV Lasix 80 mg and put  on nitro drip.  She was off of BiPAP during evaluation the next morning.  Nitro drip was weaned off and her home blood pressure medications were resumed. Looks like her dry weight is about 366 pounds and she has gained 10 pounds in the last month.  Because patient expressed her wish to be discharged today to attend an important event the next day, she was given 2 extra doses of IV Lasix 80 mg with metolazone 5 mg.  She had at least 3 L of urine output.  She was satting well on 2 L.  She developed an AKI secondary to aggressive diuresis.  I strongly recommended patient to stay overnight so we can recheck her kidney function the next day but patient strongly declined.  We will make an appointment for her with the [facility] in the next few days for recheck BMP.  In the meantime I advised patient to continue taking torsemide 40 mg daily with her potassium supplement.  Patient agrees with the plan.  Subjective Patient was seen at bedside this morning with her daughter. Hard for her to adhere to torsemide because she is active, frequently  out and about, and torsemide makes her urinate frequently, sometimes causing accidents. She doesn't have the bladder control to prevent accidents. She states that she has to attend an important event the next day.  She said "If I ain't dead, I have to be there."  On reevaluation this afternoon, patient reports improvement of her shortness of breath and lower extremity edema.  She feels well to go home.  Her daughter will pick her up.  Patient will try to take her torsemide daily with her potassium supplement.  Discharge Exam:   BP 107/64   Pulse 82   Temp 98.1 F (36.7 C)   Resp 18   Ht 6' 2"  (1.88 m)   Wt (!) 170.9 kg   SpO2 95%   BMI 48.37 kg/m   Discharge exam:  General: ill-appearing woman sitting in bed, not in acute distress HENT: normocephalic and atraumatic, mucous membranes moist Cardiovascular: irregularly irregular rhythm, no murmurs, 1+ pitting edema in  bilateral lower extremities, stasis dermatitis Pulmonary/Chest: breathing slightly labored, crackles to middle fields bilaterally, expiratory wheezing bilaterally Abdominal: soft, non-tender, no distension Neurological: alert and oriented to person-place-time Skin: warm and dry Psych: normal mood and affect  Pertinent Labs, Studies, and Procedures:      Latest Ref Rng & Units 01/04/2022    3:14 AM 11/22/2021    2:11 PM 04/27/2021    9:29 AM  CBC  WBC 4.0 - 10.5 K/uL 5.9  4.7  4.9   Hemoglobin 12.0 - 15.0 g/dL 12.5  12.2  13.2   Hematocrit 36.0 - 46.0 % 38.2  36.3  38.4   Platelets 150 - 400 K/uL 371  459  381       Latest Ref Rng & Units 01/04/2022    4:50 PM 01/04/2022    3:14 AM 01/03/2022    9:18 AM  CMP  Glucose 70 - 99 mg/dL 144  114  93   BUN 8 - 23 mg/dL 16  11  14    Creatinine 0.44 - 1.00 mg/dL 1.28  0.77  0.81   Sodium 135 - 145 mmol/L 141  141  140   Potassium 3.5 - 5.1 mmol/L 3.6  3.6  4.1   Chloride 98 - 111 mmol/L 102  108  112   CO2 22 - 32 mmol/L 28  21  22    Calcium 8.9 - 10.3 mg/dL 9.3  8.6  9.5    BNP 174.3 DG Chest Portable 1 View  Result Date: 01/04/2022 CLINICAL DATA:  Respiratory distress EXAM: PORTABLE CHEST 1 VIEW COMPARISON:  11/22/2021 FINDINGS: Cardiac shadow is enlarged but stable. Increased vascular congestion is noted without significant edema. No bony abnormality is noted. IMPRESSION: Mild CHF. Electronically Signed   By: Inez Catalina M.D.   On: 01/04/2022 02:35     Discharge Instructions:  Discharge Instructions     Call MD for:  difficulty breathing, headache or visual disturbances   Complete by: As directed    Call MD for:  persistant nausea and vomiting   Complete by: As directed    Call MD for:  redness, tenderness, or signs of infection (pain, swelling, redness, odor or green/yellow discharge around incision site)   Complete by: As directed    Call MD for:  severe uncontrolled pain   Complete by: As directed    Diet - low sodium heart  healthy   Complete by: As directed    Discharge instructions   Complete by: As directed    Sharon Vasquez,  It was a pleasure taking care of you during this admission.  You were hospitalized for shortness of breath from fluid in your lungs.  This is due to heart failure exacerbation from not taking your torsemide.  We were able to diurese you with IV Lasix.  I am glad that your symptoms are better.  We rechecked your blood work this afternoon and your kidney function was slightly down from baseline.  This is likely from aggressive IV diuresis.  Typically we ask patient to stay in the hospital for 1 night to recheck the kidney function the next day.  Since you have an important event to attend tomorrow, we will schedule a follow-up appointment for you in the next few days at the [facility] Medicine Clinic to recheck your kidney function.  Please start to take your torsemide 40 mg daily as consistently as possible.  Please return to the emergency room if you develop worsening shortness of breath.  Take care,  Dr. Alfonse Spruce   Increase activity slowly   Complete by: As directed         Signed: Gaylan Gerold, DO Internal Medicine Residency My pager: [phone]

## 2022-01-04 NOTE — ED Provider Notes (Addendum)
[facility] EMERGENCY DEPARTMENT Provider Note   CSN: [id number] Arrival date & time: 01/04/22  0207     History  Chief Complaint  Patient presents with   Respiratory Distress    Sharon Vasquez is a 71 y.o. female.  HPI     This is a 71 year old female with a history of CHF and COPD who presents with respiratory distress.  She presents by EMS.  Per EMS they were called out for respiratory distress.  She took an albuterol at home with minimal relief.  She was given Solu-Medrol, DuoNeb and was placed on BiPAP.  Room air sat was not obtained but sats on BiPAP 100%.  She was significantly hypertensive in route with blood pressures 220/102.  She had significant increased work of breathing.  She denies any chest pain, recent fevers, cough.  Level 5 caveat  Home Medications Prior to Admission medications   Medication Sig Start Date End Date Taking? Authorizing Provider  albuterol (VENTOLIN HFA) 108 (90 Base) MCG/ACT inhaler Inhale 2 puffs into the lungs every 4 (four) hours as needed for shortness of breath. 08/25/21   Timothy Lasso, MD  aspirin 81 MG chewable tablet Chew 81 mg by mouth daily. 12/15/20   [provider]  atorvastatin (LIPITOR) 80 MG tablet TAKE 1 TABLET BY MOUTH EVERY DAY 12/02/21   Atway, Rayann N, DO  esomeprazole (NEXIUM) 40 MG capsule Take 1 capsule (40 mg total) by mouth daily as needed (for heartburn or indigestion). Patient taking differently: Take 40 mg by mouth daily. 01/20/19   Marcelyn Bruins, MD  Fluticasone-Umeclidin-Vilant (TRELEGY ELLIPTA) 100-62.5-25 MCG/ACT AEPB Inhale 1 puff into the lungs daily. 06/22/21   Rigoberto Noel, MD  gabapentin (NEURONTIN) 400 MG capsule TAKE 1 CAPSULE BY MOUTH  TWICE DAILY 10/05/21   Atway, Rayann N, DO  ipratropium-albuterol (DUONEB) 0.5-2.5 (3) MG/3ML SOLN Take 3 mLs by nebulization every 6 (six) hours as needed. 06/18/20   Martyn Ehrich, NP  isosorbide mononitrate (IMDUR) 30 MG 24 hr tablet  TAKE 1 TABLET(30 MG) BY MOUTH DAILY 12/29/21   Atway, Rayann N, DO  letrozole (FEMARA) 2.5 MG tablet TAKE 1 TABLET(2.5 MG) BY MOUTH DAILY 11/28/21   Nicholas Lose, MD  losartan (COZAAR) 50 MG tablet Take 1 tablet (50 mg total) by mouth daily. 12/02/21 12/02/22  Masters, Katie, DO  metoprolol succinate (TOPROL XL) 25 MG 24 hr tablet Take 1 tablet (25 mg total) by mouth daily. 12/02/21   Atway, Rayann N, DO  mirabegron ER (MYRBETRIQ) 50 MG TB24 tablet Take 1 tablet (50 mg total) by mouth daily. 11/17/21   Atway, Rayann N, DO  nicotine (NICOTROL) 10 MG inhaler Inhale 1 continuous puffing into the lungs as needed.    [provider]  oxyCODONE (ROXICODONE) 15 MG immediate release tablet Take 15 mg by mouth every 6 (six) hours as needed for pain. 08/05/20   [provider]  OXYGEN Inhale 2-3 L into the lungs daily.    [provider]  oxymetazoline (AFRIN) 0.05 % nasal spray Place 1 spray into both nostrils 2 (two) times daily as needed for congestion.    [provider]  potassium chloride SA (KLOR-CON M) 20 MEQ tablet TAKE 1 TABLET BY MOUTH DAILY 12/29/21   Atway, Rayann N, DO  PRESCRIPTION MEDICATION Inhale into the lungs at bedtime. cpap    [provider]  Semaglutide,0.25 or 0.'5MG'$ /DOS, 2 MG/3ML SOPN Inject 0.5 Units into the skin once a week. 12/02/21  Masters, Joellen Jersey, DO  torsemide (DEMADEX) 20 MG tablet Take 2 tablets by mouth daily, you may take an extra 1/2 tablet only as needed for weight gain of 2 lbs overnight or 5 lbs in a week 12/02/21   Masters, Katie, DO  TRELEGY ELLIPTA 100-62.5-25 MCG/ACT AEPB INHALE 1 PUFF INTO THE LUNGS DAILY 11/16/21   Rigoberto Noel, MD  XARELTO 20 MG TABS tablet TAKE 1 TABLET(20 MG) BY MOUTH EVERY MORNING 01/27/21   Harvie Heck, MD      Allergies    Patient has no known allergies.    Review of Systems   Review of Systems  Unable to perform ROS: Severe respiratory distress    Physical Exam Updated Vital Signs BP (!) 178/120    Pulse 100   Temp (!) 97 F (36.1 C) (Temporal)   Resp (!) 21   Ht 1.88 m ('6\' 2"'$ )   Wt (!) 170.9 kg   SpO2 96%   BMI 48.37 kg/m  Physical Exam Vitals and nursing note reviewed.  Constitutional:      Appearance: She is well-developed.     Comments: Morbidly obese  HENT:     Head: Normocephalic and atraumatic.     Mouth/Throat:     Mouth: Mucous membranes are moist.  Eyes:     Pupils: Pupils are equal, round, and reactive to light.  Cardiovascular:     Rate and Rhythm: Regular rhythm. Tachycardia present.     Heart sounds: Normal heart sounds.  Pulmonary:     Effort: Respiratory distress present.     Breath sounds: Wheezing present.     Comments: Diffuse Rales with crackles in the base, occasional wheeze Increased work of breathing, tachypnea Abdominal:     Palpations: Abdomen is soft.  Musculoskeletal:     Cervical back: Neck supple.     Right lower leg: Edema present.     Left lower leg: Edema present.     Comments: 2+ bilateral lower extremity edema, chronic venous stasis changes of the skin of the lower extremities  Skin:    General: Skin is warm and dry.  Neurological:     Mental Status: She is alert and oriented to person, place, and time.  Psychiatric:        Mood and Affect: Mood normal.     ED Results / Procedures / Treatments   Labs (all labs ordered are listed, but only abnormal results are displayed) Labs Reviewed  BASIC METABOLIC PANEL - Abnormal; Notable for the following components:      Result Value   CO2 21 (*)    Glucose, Bld 114 (*)    Calcium 8.6 (*)    All other components within normal limits  SARS CORONAVIRUS 2 BY RT PCR  CBC WITH DIFFERENTIAL/PLATELET  BRAIN NATRIURETIC PEPTIDE  URINALYSIS, ROUTINE W REFLEX MICROSCOPIC    EKG None Atrial fibrillation for which she has a history, rate controlled heart rate 87, no ST elevation or ischemia  Radiology DG Chest Portable 1 View  Result Date: 01/04/2022 CLINICAL DATA:  Respiratory  distress EXAM: PORTABLE CHEST 1 VIEW COMPARISON:  11/22/2021 FINDINGS: Cardiac shadow is enlarged but stable. Increased vascular congestion is noted without significant edema. No bony abnormality is noted. IMPRESSION: Mild CHF. Electronically Signed   By: Inez Catalina M.D.   On: 01/04/2022 02:35    Procedures .Critical Care  Performed by: Merryl Hacker, MD Authorized by: Merryl Hacker, MD   Critical care provider statement:    Critical  care time (minutes):  40   Critical care was necessary to treat or prevent imminent or life-threatening deterioration of the following conditions:  Respiratory failure   Critical care was time spent personally by me on the following activities:  Development of treatment plan with patient or surrogate, discussions with consultants, evaluation of patient's response to treatment, examination of patient, ordering and review of laboratory studies, ordering and review of radiographic studies, ordering and performing treatments and interventions, pulse oximetry, re-evaluation of patient's condition and review of old charts     Medications Ordered in ED Medications  nitroGLYCERIN 50 mg in dextrose 5 % 250 mL (0.2 mg/mL) infusion (5 mcg/min Intravenous New Bag/Given 01/04/22 0334)  furosemide (LASIX) injection 80 mg (80 mg Intravenous Given 01/04/22 0437)    ED Course/ Medical Decision Making/ A&P Clinical Course as of 01/04/22 0519  Wed Jan 04, 2022  0518 She reports overall improvement.  She is on 3 L of oxygen.  Continues to have some wheezing.  Will order a DuoNeb.  Blood pressure down to 283 systolic.  She is on 5 mcg/min of nitroglycerin. [CH]    Clinical Course User Index [CH] Sherline Eberwein, Barbette Hair, MD                           Medical Decision Making Amount and/or Complexity of Data Reviewed Labs: ordered. Radiology: ordered.  Risk Prescription drug management. Decision regarding hospitalization.   This patient presents to the ED for concern  of respiratory distress, this involves an extensive number of treatment options, and is a complaint that carries with it a high risk of complications and morbidity.  I considered the following differential and admission for this acute, potentially life threatening condition.  The differential diagnosis includes CHF, COPD exacerbation, pneumonia, pneumothorax, hypertensive urgency/emergency  MDM:    This is a 71 year old female who presents with shortness of breath.  She is ill-appearing but nontoxic.  She is currently on CPAP.  This sounds like it was fairly cute onset.  She is significantly hypertensive.  She has some Rales on exam as well as some wheezing.  Could be multifactorial as she has a history of COPD and CHF.  Heart failure is diastolic based on last echocardiogram reviewed in the chart.  She is on torsemide daily.  Prior to arrival was given a DuoNeb and Solu-Medrol.  Patient was placed on BiPAP for comfort given work of breathing.  Chest x-ray is consistent with mild CHF.  No evidence of pneumonia.  Patient denies fevers or cough.  EKG shows rate controlled atrial fibrillation for which she has a history.  No evidence of acute ischemia.  Labs obtained and no leukocytosis.  Patient was treated for CHF with 80 IV Lasix and IV nitroglycerin.  Blood pressure improved rapidly as the patient's respiratory status.  She did have some lingering wheezing and was given an additional DuoNeb.  She was able to be weaned off of BiPAP.  Feel her presentation is likely multifactorial but with likely a large component of hypertensive urgency and CHF.  She is now on 3 L of oxygen and states she feels somewhat improved.  Endorses that her shortness of breath is still worse with lying flat.  BNP was held at the lab but this does not change her disposition.  We will plan for admission to the hospitalist.  (Labs, imaging, consults)  Labs: I Ordered, and personally interpreted labs.  The pertinent results include:  CBC, BMP, COVID-19  Imaging Studies ordered: I ordered imaging studies including chest x-ray I independently visualized and interpreted imaging. I agree with the radiologist interpretation  Additional history obtained from chart review.  External records from outside source obtained and reviewed including prior evaluations  Cardiac Monitoring: The patient was maintained on a cardiac monitor.  I personally viewed and interpreted the cardiac monitored which showed an underlying rhythm of: Sinus rhythm  Reevaluation: After the interventions noted above, I reevaluated the patient and found that they have :improved  Social Determinants of Health: Lives independently  Disposition: Admit  Co morbidities that complicate the patient evaluation  Past Medical History:  Diagnosis Date   A-fib ([place])    Anxiety    Arthritis    "qwhre; joints, back" (04/17/2017)   Benign breast cyst in female, left 01/08/2017   Found by Screening mammogram, evaluated by U/S on 01/08/17 and determined to be a benign simple breast cyst.   Breast cancer ([place])    Cellulitis of left lower leg 05/30/2017   CHF (congestive heart failure) ([place])    Chronic low back pain 08/21/2016   Chronic lower back pain    Chronic venous insufficiency    /notes 05/30/2017   COPD (chronic obstructive pulmonary disease) ([place])    Depression    Diabetes mellitus without complication ([place])    DVT (deep venous thrombosis) ([place]) 11/16/2016   Dysrhythmia    GERD (gastroesophageal reflux disease)    Headache    "weekly for the last 3 months" (04/17/2017)   Hyperlipidemia    Hypertension    Morbid obesity ([place])    PE (pulmonary embolism)    Pulmonary embolism ([place]) 09/21/2014   Sleep apnea      Medicines Meds ordered this encounter  Medications   nitroGLYCERIN 50 mg in dextrose 5 % 250 mL (0.2 mg/mL) infusion   furosemide (LASIX) injection 80 mg    I have reviewed the patients home medicines and have made adjustments as  needed  Problem List / ED Course: Problem List Items Addressed This Visit   None Visit Diagnoses     Acute respiratory failure with hypoxia ([place])    -  Primary   Hypertensive urgency       Relevant Medications   nitroGLYCERIN 50 mg in dextrose 5 % 250 mL (0.2 mg/mL) infusion   furosemide (LASIX) injection 80 mg (Completed)   History of COPD                       Final Clinical Impression(s) / ED Diagnoses Final diagnoses:  Acute respiratory failure with hypoxia ([place])  Hypertensive urgency  History of COPD    Rx / DC Orders ED Discharge Orders     None         Tashi Band, Barbette Hair, MD 01/04/22 6222    Merryl Hacker, MD 01/04/22 [phone]

## 2022-01-04 NOTE — Hospital Course (Signed)
Didn't sleep. Had a rough night. Hard for her to adhere to torsemide because she is active, frequently out and about, and torsemide makes her urinate frequently, sometimes causing accidents. She doesn't have the bladder control to prevent accidents. Reports pain in knees. Takes oxycodone 15 mg at home for pain. Pain has become severe as she has been diuresed. Has to leave prior to an engagement tomorrow. "If I ain't dead, I have to be there."

## 2022-01-04 NOTE — H&P (Signed)
Date: 01/04/2022               Patient Name:  Sharon Vasquez [name]: [id number]  DOB: 1951/03/17 Age / Sex: 71 y.o., female   PCP: [name], DO         Medical Service: Internal Medicine Teaching Service         Attending Physician: : Dr. Angelia Mould    First Contact: Roswell Nickel, MD      Pager: [phone]      Second Contact: Lorine Bears      Pager: Tenny Craw           After Hours (After 5p/  First Contact Pager: [phone]  weekends / holidays): Second Contact Pager: [phone]   SUBJECTIVE   Chief Complaint: shortness of breath  History of Present Illness:  71 yo person living with a history of COPD/ILD, OSA, diastolic HF, HTN, permanent atrial fibrillation rate controlled, DM, presenting with acute on chronic respiratory distress  Patient reports she was around some friends yesterday who were smoking and she was exposed to the smoke. She went upstairs and realized she had been more short of breath than usual. She also noticed her HR had been >120 and her blood pressure was high associated with a HA.   She mentioned that she has not been taking her torsemide as prescribed as she has been social, working in her community, and taking torsemide makes her incontinent. Just this past week she took  her medication maybe 2-3 days. She has not noticed her her legs or her abdomen getting swollen but has reported getting progressively more short of breath and requiring to use her O2 at home more often. She was able to use some of her duonebs at home with minimal relief.  She is chronically unable to lay flat, sleeps on hospital bed. Denies waking up from sleep gasping from air. Has been coughing since yesterday but denies congestion. She has been using her CPAP machine consistently.   No CP, dizziness, lightheadedness or changes in vision. No changes in bowel habits, nausea or vomiting. She is able to produce urine.  States she cannot be admitted to the hospital for long  as as she has to fulfill a compromise for which she has been financially compensated. She's amenable to staying in until tomorrow morning.   ED Course: Presented by EMS and was given Solu-Medrol, duoneb, and was placed on CPAP. On arrival, patient was oriented, with rales on exam, and wheezing. She hypertensive 220/102, BNP 174, CXR with pulmonary vascular congestion without evidence of pneumonia. EKG with atrial fibrillation, put on BiPAP, nitroglycerin drip, and given 80 mg lasix. She was weaned off BiPAP and transitioned to 3L of O2 [place] and comfortable. Patient is also off Nitro drip and hypertensive.  Meds:  Current Meds  Medication Sig   albuterol (VENTOLIN HFA) 108 (90 Base) MCG/ACT inhaler Inhale 2 puffs into the lungs every 4 (four) hours as needed for shortness of breath.   aspirin 81 MG chewable tablet Chew 81 mg by mouth daily.   atorvastatin (LIPITOR) 80 MG tablet TAKE 1 TABLET BY MOUTH EVERY DAY (Patient taking differently: Take 80 mg by mouth daily.)   esomeprazole (NEXIUM) 40 MG capsule Take 1 capsule (40 mg total) by mouth daily as needed (for heartburn or indigestion). (Patient taking differently: Take 40 mg by mouth daily.)   Fluticasone-Umeclidin-Vilant (TRELEGY ELLIPTA) 100-62.5-25 MCG/ACT AEPB Inhale 1 puff into the lungs daily.   gabapentin (NEURONTIN) 400  MG capsule TAKE 1 CAPSULE BY MOUTH  TWICE DAILY (Patient taking differently: Take 400 mg by mouth at bedtime.)   ipratropium-albuterol (DUONEB) 0.5-2.5 (3) MG/3ML SOLN Take 3 mLs by nebulization every 6 (six) hours as needed.   isosorbide mononitrate (IMDUR) 30 MG 24 hr tablet TAKE 1 TABLET(30 MG) BY MOUTH DAILY (Patient taking differently: Take 30 mg by mouth daily.)   letrozole (FEMARA) 2.5 MG tablet TAKE 1 TABLET(2.5 MG) BY MOUTH DAILY (Patient taking differently: Take 2.5 mg by mouth daily. TAKE 1 TABLET(2.5 MG) BY MOUTH DAILY)   losartan (COZAAR) 50 MG tablet Take 1 tablet (50 mg total) by mouth daily.   metoprolol  succinate (TOPROL XL) 25 MG 24 hr tablet Take 1 tablet (25 mg total) by mouth daily.   mirabegron ER (MYRBETRIQ) 50 MG TB24 tablet Take 1 tablet (50 mg total) by mouth daily.   oxyCODONE (ROXICODONE) 15 MG immediate release tablet Take 15 mg by mouth every 6 (six) hours as needed for pain.   OXYGEN Inhale 2-3 L into the lungs daily.   potassium chloride SA (KLOR-CON M) 20 MEQ tablet TAKE 1 TABLET BY MOUTH DAILY   PRESCRIPTION MEDICATION Inhale into the lungs at bedtime. cpap   Semaglutide,0.25 or 0.'5MG'$ /DOS, 2 MG/3ML SOPN Inject 0.5 Units into the skin once a week. (Patient taking differently: Inject 0.5 Units into the skin once a week. Wednesday)   torsemide (DEMADEX) 20 MG tablet Take 2 tablets by mouth daily, you may take an extra 1/2 tablet only as needed for weight gain of 2 lbs overnight or 5 lbs in a week   TRELEGY ELLIPTA 100-62.5-25 MCG/ACT AEPB INHALE 1 PUFF INTO THE LUNGS DAILY   XARELTO 20 MG TABS tablet TAKE 1 TABLET(20 MG) BY MOUTH EVERY MORNING (Patient taking differently: Take 20 mg by mouth in the morning.)    Past Medical History  Past Surgical History:  Procedure Laterality Date   ABDOMINAL HYSTERECTOMY     APPENDECTOMY     BALLOON DILATION N/A 03/09/2020   Procedure: BALLOON DILATION;  Surgeon: Otis Brace, MD;  Location: [facility];  Service: Gastroenterology;  Laterality: N/A;   BIOPSY  03/09/2020   Procedure: BIOPSY;  Surgeon: Otis Brace, MD;  Location: [facility];  Service: Gastroenterology;;   BIOPSY  04/12/2021   Procedure: BIOPSY;  Surgeon: Otis Brace, MD;  Location: [facility];  Service: Gastroenterology;;   BREAST BIOPSY Right 03/11/2021   US biopsy/ coil clip/ path pending   BREAST BIOPSY Right 03/11/2021   US biopsy/ ribbone clip/ path pending   BREAST CYST EXCISION Left    "six o'clock"   BREAST LUMPECTOMY Left    BREAST LUMPECTOMY WITH RADIOACTIVE SEED LOCALIZATION Right 05/02/2021   Procedure: RIGHT BREAST LUMPECTOMY WITH  RADIOACTIVE SEED LOCALIZATION;  Surgeon: Jovita Kussmaul, MD;  Location: [facility];  Service: General;  Laterality: Right;   CHOLECYSTECTOMY     DILATION AND CURETTAGE OF UTERUS     ESOPHAGOGASTRODUODENOSCOPY (EGD) WITH PROPOFOL N/A 03/09/2020   Procedure: ESOPHAGOGASTRODUODENOSCOPY (EGD) WITH PROPOFOL;  Surgeon: Otis Brace, MD;  Location: [facility];  Service: Gastroenterology;  Laterality: N/A;   ESOPHAGOGASTRODUODENOSCOPY (EGD) WITH PROPOFOL N/A 04/12/2021   Procedure: ESOPHAGOGASTRODUODENOSCOPY (EGD) WITH PROPOFOL;  Surgeon: Otis Brace, MD;  Location: [facility];  Service: Gastroenterology;  Laterality: N/A;   IR ABLATE LIVER CRYOABLATION  07/23/2019   IR RADIOLOGIST EVAL & MGMT  07/18/2019   SAVORY DILATION N/A 04/12/2021   Procedure: SAVORY DILATION;  Surgeon: Otis Brace, MD;  Location: [facility];  Service: Gastroenterology;  Laterality: N/A;   TONSILLECTOMY AND ADENOIDECTOMY     TUBAL LIGATION      Social:  Lives With: Daughter in [place], [place] 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   OBJECTIVE:   Physical Exam: Blood pressure (!) 144/84, pulse 88, temperature (!) 97 F (36.1 C), temperature source Temporal, resp. rate (!) 25, height '6\' 2"'$  (1.88 m), weight (!) 170.9 kg, SpO2 96 %. On 2L [place] Constitutional:illl-appearing woman sitting in bed, in  acute distress HENT: normocephalic atraumatic, mucous membranes moist Cardiovascular: irregularly irregular rhythm, no m/r/g,  JVD to jaw line. Nonpalpable PD pulses on exam, but present on bedside doppler. Pulmonary/Chest: labored WOB, with crackled to mid lungs and expiratory wheezing bilaterally. Abdominal: soft, non-tender, non-distended.   Neurological: alert & oriented x 3 MSK: no gross abnormalities. 2+ pitting edema on bilaterally dorsal feet. Chronic tense edema up to bilateral knees with stasis dermatitis Skin: warm and dry Psych: Normal mood and affect  Labs: CBC    Component Value Date/Time   WBC 5.9 01/04/2022 0314   RBC 4.37 01/04/2022 0314   HGB 12.5 01/04/2022 0314   HGB 11.3 (L) 03/23/2021 0727   HGB 12.4 04/29/2018 1235   HCT 38.2 01/04/2022 0314   HCT 37.2 04/29/2018 1235   PLT 371 01/04/2022 0314   PLT 341 03/23/2021 0727   PLT 389 04/29/2018 1235   MCV 87.4 01/04/2022 0314   MCV 86 04/29/2018 1235   MCH 28.6 01/04/2022 0314   MCHC 32.7 01/04/2022 0314   RDW 14.1 01/04/2022 0314   RDW 13.4 04/29/2018 1235   LYMPHSABS 0.8 01/04/2022 0314   MONOABS 0.2 01/04/2022 0314   EOSABS 0.0 01/04/2022 0314   BASOSABS 0.0 01/04/2022 0314     CMP     Component Value Date/Time   NA 141 01/04/2022 0314   NA 140 01/03/2022 0918   K 3.6 01/04/2022 0314   CL 108 01/04/2022 0314   CO2 21 (L) 01/04/2022 0314   GLUCOSE 114 (H) 01/04/2022 0314   BUN 11 01/04/2022 0314   BUN 14 01/03/2022 0918   CREATININE 0.77 01/04/2022 0314   CREATININE 0.92 03/23/2021 0727   CREATININE 1.21 (H) 07/26/2016 1528   CALCIUM 8.6 (L) 01/04/2022 0314   PROT 7.1 04/27/2021 0929   ALBUMIN 3.7 04/27/2021 0929   AST 21 04/27/2021 0929   AST 17 03/23/2021 0727   ALT 18 04/27/2021 0929   ALT 15 03/23/2021 0727   ALKPHOS 66 04/27/2021 0929   BILITOT 0.7 04/27/2021 0929   BILITOT 0.6 03/23/2021 0727   GFRNONAA >60 01/04/2022 0314   GFRNONAA >60 03/23/2021 0727   GFRAA 79 10/23/2019 0924    Imaging: DG Chest Portable 1 View  Result Date: 01/04/2022 CLINICAL DATA:  Respiratory distress EXAM: PORTABLE CHEST 1 VIEW COMPARISON:  11/22/2021 FINDINGS: Cardiac shadow is enlarged but stable. Increased vascular congestion is noted without significant edema. No bony abnormality is noted. IMPRESSION: Mild CHF. Electronically Signed    By: Inez Catalina M.D.   On: 01/04/2022 02:35      EKG: personally reviewed my interpretation is atrial fibrillation without acute signs of ischemia. Prior EKG similar findings  ASSESSMENT & PLAN:   Assessment & Plan by Problem:  Principal Problem:   Acute on chronic hypoxic respiratory failure ([place])   71 yo person living with a history of COPD/ILD, OSA, diastolic HF, HTN, permanent atrial fibrillation rate controlled, DM admitted for acute on chronic respiratory failure  Acute on chronic hypoxic respiratory failure Diastolic HF Last echo LV EF 60-65% 10/2020. Patient is prescribed '20mg'$  of torsemide daily, however, patient does not take daily. At times only takes '10mg'$ . Has not been consistent for ~2 weeks. Her presentation is likely multifactorial in the setting of known diastolic HF with inconsistent use of home diuretic; COPD and smoker status; and uncontrolled hypertension, all which could be contributory. Patient does not endorse infectious signs, or increased sputum production at this time to suggest acute COPD exacerbation. CXR with evidence of pulmonary congestion. BNP 174; however, patient's BMI 48.3. Patient given '80mg'$  IV Lasix in the ED and weaned off BiPAP, currently saturating 95% on 2L and comfortable. Patient is currently being treated IV boluses of Lasix for potential HF exacerbation and monitoring of respiratory status. Current Cr is at patient's baseline 0.7, and K 3.6.  -Lasix '80mg'$  IV, schedule for 1200 -KCL supplementation -Monitor and replete electrolytes as needed -Daily weights -Strict I/O  COPD No infectious signs, increased cough or sputum production to suggest exacerbation. Patient usually on 3L O2 at home and compliant with home Trelegy therapy -Continue on Breo Ellipta -Continue Incruse Ellipta -Continue on duonebs  Hypertension HLD Concerning for hypertensive emergency. S/p nitroglycerin drip in the ED. Home therapy includes Losartan '50mg'$ , Imdur '30mg'$ . Currently  asymptomatic; expect better control with improvement in fluid status. Patient took medications at midnight. Consider restarting her home regimen. -CTM on cardiac monitoring -Continue on Lipitor '80mg'$  -Restart home regimen off of nitroglycerin drip  Permanent Atrial fibrillation Rate controlled -On cardiac monitoring -Continue on Metoprolol '25mg'$  daily -Continue on Xarelto '25mg'$   OSA Chronic, patient wears CPAP machine nightly -CPAP qHS  DM Diabetic neuropathy Obesity 9/1 A1c 5.8, diet controlled. Currently on Ozempic 0.5 weekly. -Continue gabapentin '400mg'$  BID  Current smoker 4-5 cigarettes/day. Patient on nicotine inhaler.   Breast Cancer RUQ -Continue Letrozole 2.'5mg'$  daily  GERD -Continue on pantoprazole  Diet: Heart Healthy VTE:  Xarelto Code: Full  Prior to Admission Living Arrangement: Home, living daughters Anticipated Discharge Location: Home Barriers to Discharge: Clinical improvement  Dispo: Admit patient to Observation with expected length of stay less than 2 midnights.  Signed:   Romana Juniper, MD Internal Medicine Resident PGY-1 01/04/2022, 6:57 AM

## 2022-01-05 ENCOUNTER — Other Ambulatory Visit: Payer: Self-pay | Admitting: Internal Medicine

## 2022-01-05 ENCOUNTER — Telehealth: Payer: Self-pay

## 2022-01-05 DIAGNOSIS — Z79899 Other long term (current) drug therapy: Secondary | ICD-10-CM | POA: Diagnosis not present

## 2022-01-05 DIAGNOSIS — I10 Essential (primary) hypertension: Secondary | ICD-10-CM

## 2022-01-05 NOTE — Patient Outreach (Signed)
  Care Coordination TOC Note Transition Care Management Unsuccessful Follow-up Telephone Call  Date of discharge and from where:  [name]-01/05/2022  Attempts:  1st Attempt  Reason for unsuccessful TCM follow-up call:  No answer/busy  Johnney Killian, RN, BSN, CCM Care Management Coordinator [facility] Phone: [phone]: [phone]

## 2022-01-06 ENCOUNTER — Telehealth: Payer: Self-pay

## 2022-01-06 NOTE — Patient Outreach (Signed)
  Care Coordination [facility] Note Transition Care Management Follow-up Telephone Call Date of discharge and from where: [name] 01/04/22 How have you been since you were released from the hospital? Connected with patient who shared she is at a meeting and cannot talk.  Plan to call her on 01/09/22. Johnney Killian, RN, BSN, CCM Care Management Coordinator [place] Phone: [phone]: [phone]

## 2022-01-08 ENCOUNTER — Observation Stay (HOSPITAL_COMMUNITY): Payer: Medicare Other

## 2022-01-08 ENCOUNTER — Emergency Department (HOSPITAL_COMMUNITY): Payer: Medicare Other

## 2022-01-08 ENCOUNTER — Encounter (HOSPITAL_COMMUNITY): Payer: Self-pay

## 2022-01-08 ENCOUNTER — Inpatient Hospital Stay (HOSPITAL_COMMUNITY)
Admission: EM | Admit: 2022-01-08 | Discharge: 2022-01-10 | DRG: 153 | Disposition: A | Discharge status: Home Health | Payer: Medicare Other | Attending: Internal Medicine | Admitting: Internal Medicine

## 2022-01-08 ENCOUNTER — Other Ambulatory Visit: Payer: Self-pay

## 2022-01-08 DIAGNOSIS — M545 Low back pain, unspecified: Secondary | ICD-10-CM | POA: Diagnosis not present

## 2022-01-08 DIAGNOSIS — R6520 Severe sepsis without septic shock: Secondary | ICD-10-CM | POA: Diagnosis not present

## 2022-01-08 DIAGNOSIS — E876 Hypokalemia: Secondary | ICD-10-CM | POA: Diagnosis present

## 2022-01-08 DIAGNOSIS — B9789 Other viral agents as the cause of diseases classified elsewhere: Secondary | ICD-10-CM | POA: Diagnosis present

## 2022-01-08 DIAGNOSIS — I872 Venous insufficiency (chronic) (peripheral): Secondary | ICD-10-CM | POA: Diagnosis present

## 2022-01-08 DIAGNOSIS — I5020 Unspecified systolic (congestive) heart failure: Secondary | ICD-10-CM

## 2022-01-08 DIAGNOSIS — R0602 Shortness of breath: Secondary | ICD-10-CM | POA: Diagnosis not present

## 2022-01-08 DIAGNOSIS — R609 Edema, unspecified: Secondary | ICD-10-CM | POA: Diagnosis not present

## 2022-01-08 DIAGNOSIS — Z20822 Contact with and (suspected) exposure to covid-19: Secondary | ICD-10-CM | POA: Diagnosis not present

## 2022-01-08 DIAGNOSIS — Z6841 Body Mass Index (BMI) 40.0 and over, adult: Secondary | ICD-10-CM

## 2022-01-08 DIAGNOSIS — I5030 Unspecified diastolic (congestive) heart failure: Secondary | ICD-10-CM

## 2022-01-08 DIAGNOSIS — J449 Chronic obstructive pulmonary disease, unspecified: Secondary | ICD-10-CM | POA: Insufficient documentation

## 2022-01-08 DIAGNOSIS — F419 Anxiety disorder, unspecified: Secondary | ICD-10-CM | POA: Diagnosis present

## 2022-01-08 DIAGNOSIS — N281 Cyst of kidney, acquired: Secondary | ICD-10-CM | POA: Diagnosis not present

## 2022-01-08 DIAGNOSIS — Z853 Personal history of malignant neoplasm of breast: Secondary | ICD-10-CM | POA: Diagnosis not present

## 2022-01-08 DIAGNOSIS — Z743 Need for continuous supervision: Secondary | ICD-10-CM | POA: Diagnosis not present

## 2022-01-08 DIAGNOSIS — Z9981 Dependence on supplemental oxygen: Secondary | ICD-10-CM

## 2022-01-08 DIAGNOSIS — R000 Tachycardia, unspecified: Secondary | ICD-10-CM | POA: Diagnosis not present

## 2022-01-08 DIAGNOSIS — G8929 Other chronic pain: Secondary | ICD-10-CM | POA: Diagnosis present

## 2022-01-08 DIAGNOSIS — J9611 Chronic respiratory failure with hypoxia: Secondary | ICD-10-CM | POA: Diagnosis present

## 2022-01-08 DIAGNOSIS — R6889 Other general symptoms and signs: Secondary | ICD-10-CM | POA: Diagnosis not present

## 2022-01-08 DIAGNOSIS — F32A Depression, unspecified: Secondary | ICD-10-CM | POA: Diagnosis not present

## 2022-01-08 DIAGNOSIS — A419 Sepsis, unspecified organism: Secondary | ICD-10-CM | POA: Diagnosis present

## 2022-01-08 DIAGNOSIS — I110 Hypertensive heart disease with heart failure: Secondary | ICD-10-CM | POA: Diagnosis not present

## 2022-01-08 DIAGNOSIS — G4730 Sleep apnea, unspecified: Secondary | ICD-10-CM | POA: Diagnosis not present

## 2022-01-08 DIAGNOSIS — J069 Acute upper respiratory infection, unspecified: Secondary | ICD-10-CM

## 2022-01-08 DIAGNOSIS — E785 Hyperlipidemia, unspecified: Secondary | ICD-10-CM | POA: Diagnosis present

## 2022-01-08 DIAGNOSIS — K219 Gastro-esophageal reflux disease without esophagitis: Secondary | ICD-10-CM | POA: Diagnosis present

## 2022-01-08 DIAGNOSIS — F17210 Nicotine dependence, cigarettes, uncomplicated: Secondary | ICD-10-CM | POA: Diagnosis not present

## 2022-01-08 DIAGNOSIS — I4891 Unspecified atrial fibrillation: Secondary | ICD-10-CM

## 2022-01-08 DIAGNOSIS — Z86711 Personal history of pulmonary embolism: Secondary | ICD-10-CM | POA: Diagnosis not present

## 2022-01-08 DIAGNOSIS — Z7982 Long term (current) use of aspirin: Secondary | ICD-10-CM

## 2022-01-08 DIAGNOSIS — Z87891 Personal history of nicotine dependence: Secondary | ICD-10-CM | POA: Diagnosis not present

## 2022-01-08 DIAGNOSIS — E119 Type 2 diabetes mellitus without complications: Secondary | ICD-10-CM

## 2022-01-08 DIAGNOSIS — I482 Chronic atrial fibrillation, unspecified: Secondary | ICD-10-CM | POA: Diagnosis not present

## 2022-01-08 DIAGNOSIS — J00 Acute nasopharyngitis [common cold]: Secondary | ICD-10-CM | POA: Diagnosis not present

## 2022-01-08 DIAGNOSIS — Z86718 Personal history of other venous thrombosis and embolism: Secondary | ICD-10-CM

## 2022-01-08 DIAGNOSIS — Z7901 Long term (current) use of anticoagulants: Secondary | ICD-10-CM | POA: Diagnosis not present

## 2022-01-08 DIAGNOSIS — J441 Chronic obstructive pulmonary disease with (acute) exacerbation: Secondary | ICD-10-CM | POA: Diagnosis present

## 2022-01-08 DIAGNOSIS — I499 Cardiac arrhythmia, unspecified: Secondary | ICD-10-CM | POA: Diagnosis not present

## 2022-01-08 DIAGNOSIS — N179 Acute kidney failure, unspecified: Secondary | ICD-10-CM | POA: Diagnosis not present

## 2022-01-08 DIAGNOSIS — I5032 Chronic diastolic (congestive) heart failure: Secondary | ICD-10-CM | POA: Diagnosis not present

## 2022-01-08 DIAGNOSIS — Z79899 Other long term (current) drug therapy: Secondary | ICD-10-CM

## 2022-01-08 DIAGNOSIS — J189 Pneumonia, unspecified organism: Secondary | ICD-10-CM

## 2022-01-08 DIAGNOSIS — R7303 Prediabetes: Secondary | ICD-10-CM

## 2022-01-08 HISTORY — DX: Type 2 diabetes mellitus without complications: E11.9

## 2022-01-08 HISTORY — DX: Acute upper respiratory infection, unspecified: J06.9

## 2022-01-08 LAB — COMPREHENSIVE METABOLIC PANEL
ALT: 26 U/L (ref 0–44)
AST: 37 U/L (ref 15–41)
Albumin: 3.7 g/dL (ref 3.5–5.0)
Alkaline Phosphatase: 91 U/L (ref 38–126)
Anion gap: 16 — ABNORMAL HIGH (ref 5–15)
BUN: 45 mg/dL — ABNORMAL HIGH (ref 8–23)
CO2: 30 mmol/L (ref 22–32)
Calcium: 9.1 mg/dL (ref 8.9–10.3)
Chloride: 91 mmol/L — ABNORMAL LOW (ref 98–111)
Creatinine, Ser: 2.03 mg/dL — ABNORMAL HIGH (ref 0.44–1.00)
GFR, Estimated: 26 mL/min — ABNORMAL LOW (ref >60)
Glucose, Bld: 130 mg/dL — ABNORMAL HIGH (ref 70–99)
Potassium: 2.9 mmol/L — ABNORMAL LOW (ref 3.5–5.1)
Sodium: 137 mmol/L (ref 135–145)
Total Bilirubin: 1.3 mg/dL — ABNORMAL HIGH (ref 0.3–1.2)
Total Protein: 9.4 g/dL — ABNORMAL HIGH (ref 6.5–8.1)

## 2022-01-08 LAB — RESPIRATORY PANEL BY PCR

## 2022-01-08 LAB — MAGNESIUM: Magnesium: 1.6 mg/dL — ABNORMAL LOW (ref 1.7–2.4)

## 2022-01-08 LAB — CBC WITH DIFFERENTIAL/PLATELET
Abs Immature Granulocytes: 0.02 10*3/uL (ref 0.00–0.07)
Basophils Absolute: 0 10*3/uL (ref 0.0–0.1)
Basophils Relative: 0 %
Eosinophils Absolute: 0 10*3/uL (ref 0.0–0.5)
Eosinophils Relative: 0 %
HCT: 39.1 % (ref 36.0–46.0)
Hemoglobin: 13.7 g/dL (ref 12.0–15.0)
Immature Granulocytes: 0 %
Lymphocytes Relative: 24 %
Lymphs Abs: 2.7 10*3/uL (ref 0.7–4.0)
MCH: 29.1 pg (ref 26.0–34.0)
MCHC: 35 g/dL (ref 30.0–36.0)
MCV: 83 fL (ref 80.0–100.0)
Monocytes Absolute: 0.9 10*3/uL (ref 0.1–1.0)
Monocytes Relative: 8 %
Neutro Abs: 7.6 10*3/uL (ref 1.7–7.7)
Neutrophils Relative %: 68 %
Platelets: 471 10*3/uL — ABNORMAL HIGH (ref 150–400)
RBC: 4.71 MIL/uL (ref 3.87–5.11)
RDW: 13.7 % (ref 11.5–15.5)
WBC: 11.3 10*3/uL — ABNORMAL HIGH (ref 4.0–10.5)
nRBC: 0 % (ref 0.0–0.2)

## 2022-01-08 LAB — URINALYSIS, ROUTINE W REFLEX MICROSCOPIC
Bilirubin Urine: NEGATIVE
Glucose, UA: NEGATIVE mg/dL
Hgb urine dipstick: NEGATIVE
Ketones, ur: NEGATIVE mg/dL
Leukocytes,Ua: NEGATIVE
Nitrite: NEGATIVE
Protein, ur: 30 mg/dL — AB
Specific Gravity, Urine: 1.021 (ref 1.005–1.030)
pH: 6 (ref 5.0–8.0)

## 2022-01-08 LAB — PROTIME-INR
INR: 1.3 — ABNORMAL HIGH (ref 0.8–1.2)
Prothrombin Time: 16.4 seconds — ABNORMAL HIGH (ref 11.4–15.2)

## 2022-01-08 LAB — APTT: aPTT: 29 seconds (ref 24–36)

## 2022-01-08 LAB — BRAIN NATRIURETIC PEPTIDE: B Natriuretic Peptide: 46.9 pg/mL (ref 0.0–100.0)

## 2022-01-08 LAB — RESP PANEL BY RT-PCR (FLU A&B, COVID) ARPGX2
Influenza A by PCR: NEGATIVE
Influenza B by PCR: NEGATIVE
SARS Coronavirus 2 by RT PCR: NEGATIVE

## 2022-01-08 LAB — LACTIC ACID, PLASMA: Lactic Acid, Venous: 1.2 mmol/L (ref 0.5–1.9)

## 2022-01-08 LAB — STREP PNEUMONIAE URINARY ANTIGEN: Strep Pneumo Urinary Antigen: NEGATIVE

## 2022-01-08 LAB — PROCALCITONIN: Procalcitonin: <0.1 ng/mL

## 2022-01-08 MED ORDER — ASPIRIN 81 MG PO CHEW
81.0000 mg | CHEWABLE_TABLET | Freq: Every day | ORAL | Status: DC
  Administered 2022-01-08 – 2022-01-10 (×3): 81 mg via ORAL
  Filled 2022-01-08 (×3): qty 1

## 2022-01-08 MED ORDER — FLUTICASONE FUROATE-VILANTEROL 100-25 MCG/ACT IN AEPB
1.0000 | INHALATION_SPRAY | Freq: Every day | RESPIRATORY_TRACT | Status: DC
  Administered 2022-01-09 – 2022-01-10 (×2): 1 via RESPIRATORY_TRACT
  Filled 2022-01-08 (×3): qty 28

## 2022-01-08 MED ORDER — RIVAROXABAN 20 MG PO TABS
20.0000 mg | ORAL_TABLET | Freq: Every morning | ORAL | Status: DC
  Administered 2022-01-09 – 2022-01-10 (×2): 20 mg via ORAL
  Filled 2022-01-08 (×2): qty 1

## 2022-01-08 MED ORDER — OXYCODONE-ACETAMINOPHEN 5-325 MG PO TABS
1.0000 | ORAL_TABLET | Freq: Once | ORAL | Status: AC
  Administered 2022-01-08: 1 via ORAL
  Filled 2022-01-08: qty 1

## 2022-01-08 MED ORDER — ACETAMINOPHEN 500 MG PO TABS
1000.0000 mg | ORAL_TABLET | Freq: Once | ORAL | Status: AC
  Administered 2022-01-08: 1000 mg via ORAL
  Filled 2022-01-08: qty 2

## 2022-01-08 MED ORDER — UMECLIDINIUM BROMIDE 62.5 MCG/ACT IN AEPB
1.0000 | INHALATION_SPRAY | Freq: Every day | RESPIRATORY_TRACT | Status: DC
  Administered 2022-01-09 – 2022-01-10 (×2): 1 via RESPIRATORY_TRACT
  Filled 2022-01-08 (×3): qty 7

## 2022-01-08 MED ORDER — ALBUTEROL SULFATE (2.5 MG/3ML) 0.083% IN NEBU
5.0000 mg | INHALATION_SOLUTION | Freq: Once | RESPIRATORY_TRACT | Status: AC
  Administered 2022-01-08: 5 mg via RESPIRATORY_TRACT
  Filled 2022-01-08: qty 6

## 2022-01-08 MED ORDER — ATORVASTATIN CALCIUM 80 MG PO TABS
80.0000 mg | ORAL_TABLET | Freq: Every day | ORAL | Status: DC
  Administered 2022-01-08 – 2022-01-10 (×3): 80 mg via ORAL
  Filled 2022-01-08: qty 2
  Filled 2022-01-08: qty 1
  Filled 2022-01-08: qty 2

## 2022-01-08 MED ORDER — GABAPENTIN 400 MG PO CAPS
400.0000 mg | ORAL_CAPSULE | Freq: Every day | ORAL | Status: DC
  Administered 2022-01-08 – 2022-01-09 (×2): 400 mg via ORAL
  Filled 2022-01-08 (×2): qty 1

## 2022-01-08 MED ORDER — LACTATED RINGERS IV SOLN: INTRAVENOUS | Status: AC

## 2022-01-08 MED ORDER — MIRABEGRON ER 50 MG PO TB24
50.0000 mg | ORAL_TABLET | Freq: Every day | ORAL | Status: DC
  Administered 2022-01-08 – 2022-01-10 (×3): 50 mg via ORAL
  Filled 2022-01-08 (×3): qty 1

## 2022-01-08 MED ORDER — NICOTINE 14 MG/24HR TD PT24
14.0000 mg | MEDICATED_PATCH | Freq: Every day | TRANSDERMAL | Status: DC
  Administered 2022-01-08 – 2022-01-10 (×3): 14 mg via TRANSDERMAL
  Filled 2022-01-08 (×3): qty 1

## 2022-01-08 MED ORDER — POTASSIUM CHLORIDE CRYS ER 20 MEQ PO TBCR
40.0000 meq | EXTENDED_RELEASE_TABLET | Freq: Four times a day (QID) | ORAL | Status: AC
  Administered 2022-01-08 – 2022-01-09 (×2): 40 meq via ORAL
  Filled 2022-01-08 (×2): qty 2

## 2022-01-08 MED ORDER — SODIUM CHLORIDE 0.9 % IV SOLN
2.0000 g | INTRAVENOUS | Status: DC
  Administered 2022-01-08: 2 g via INTRAVENOUS
  Filled 2022-01-08: qty 20

## 2022-01-08 MED ORDER — GUAIFENESIN ER 600 MG PO TB12
600.0000 mg | ORAL_TABLET | Freq: Two times a day (BID) | ORAL | Status: DC
  Administered 2022-01-08: 600 mg via ORAL
  Filled 2022-01-08: qty 1

## 2022-01-08 MED ORDER — IPRATROPIUM BROMIDE 0.02 % IN SOLN
0.5000 mg | Freq: Once | RESPIRATORY_TRACT | Status: AC
  Administered 2022-01-08: 0.5 mg via RESPIRATORY_TRACT
  Filled 2022-01-08: qty 2.5

## 2022-01-08 MED ORDER — LETROZOLE 2.5 MG PO TABS
2.5000 mg | ORAL_TABLET | Freq: Every day | ORAL | Status: DC
  Administered 2022-01-08 – 2022-01-10 (×3): 2.5 mg via ORAL
  Filled 2022-01-08 (×3): qty 1

## 2022-01-08 MED ORDER — ACETAMINOPHEN 325 MG PO TABS: 650.0000 mg | ORAL_TABLET | Freq: Four times a day (QID) | ORAL | Status: DC | PRN

## 2022-01-08 MED ORDER — IPRATROPIUM-ALBUTEROL 0.5-2.5 (3) MG/3ML IN SOLN
3.0000 mL | RESPIRATORY_TRACT | Status: DC | PRN
  Administered 2022-01-09: 3 mL via RESPIRATORY_TRACT
  Filled 2022-01-08: qty 3

## 2022-01-08 MED ORDER — ACETAMINOPHEN 650 MG RE SUPP: 650.0000 mg | Freq: Four times a day (QID) | RECTAL | Status: DC | PRN

## 2022-01-08 MED ORDER — OXYCODONE HCL 5 MG PO TABS
15.0000 mg | ORAL_TABLET | Freq: Four times a day (QID) | ORAL | Status: DC | PRN
  Administered 2022-01-09 – 2022-01-10 (×2): 15 mg via ORAL
  Filled 2022-01-08 (×2): qty 3

## 2022-01-08 MED ORDER — SODIUM CHLORIDE 0.9 % IV SOLN
500.0000 mg | INTRAVENOUS | Status: DC
  Administered 2022-01-08: 500 mg via INTRAVENOUS
  Filled 2022-01-08: qty 5

## 2022-01-08 NOTE — ED Notes (Signed)
Patient transported to Ultrasound 

## 2022-01-08 NOTE — Sepsis Progress Note (Signed)
[facility] is following sepsis protocol.

## 2022-01-08 NOTE — Hospital Course (Signed)
-  coughing, nausea, emesis, congestion. Fever.Tired. sinus pressure. HA. Diarrhea after almond milk. . Started Thursday. Ppl noticed that she is not doing well -Felt weak and have no strength. Poor PO intake.  -Wheezing -No sick contact.    -No CP.  -On home 2-3 L   -Chronic leg pain.   -Took torsemide one dose friday, legs swelling improve.   - last smoke 6 days. Need nicotine patch  10/9 -No more diarrhea -Good PO intake.  - LE edema has improved. This is the smallest your legs have been.  -

## 2022-01-08 NOTE — H&P (Cosign Needed Addendum)
Date: 01/08/2022              Patient Name:  Sharon Vasquez [name]: [id number]  DOB: 1950/05/03 Age / Sex: 71 y.o., female   PCP: [name], DO         Medical Service: Internal Medicine Teaching Service         Attending Physician: Dr. Aldine Contes    First Contact: Dr. Carin Primrose Pager: [phone]  Second Contact: Dr. Alfonse Spruce Pager: [phone]       After Hours (After 5p/  First Contact Pager: [phone]  weekends / holidays): Second Contact Pager: [phone]   Chief Complaint: Cough, shortness of breath, fatigue.  History of Present Illness: 71 year old female with a history of CHF, COPD who presents with 3 days of cough productive of yellow sputum, shortness of breath, decreased appetite, and fatigue.  The patient's symptoms started last Friday.  She reports significant fatigue around that time, spending more time in bed and sleeping more.  From Friday into Saturday she began to have a worsening cough.  She reports bringing up copious amounts of yellowish sputum.  This is associated with worsening shortness of breath, especially with activity.  She also has pressure in her forehead and beneath her eyes.  She also reports wheezing.  Review of systems is also positive for diarrhea and decreased appetite.  She states that she has eaten very little since last Wednesday, which was 4 days ago.  However she does report that her appetite may be returning.  Regarding urination, stating that she "peed a river" after taking torsemide but since then her urine output has slowed down somewhat.  She denies dysuria.  Today, she felt like she could barely get out of bed, which is unusual for her so she decided to go to the emergency department for evaluation.  In the ED, she was febrile, tachycardic, and somewhat hypertensive on arrival.  Sepsis protocol was initiated.  IV fluids were held given history of heart failure.  IV ceftriaxone and azithromycin were started due to concern for  community-acquired pneumonia.  Meds: Patient reports that she has taken 1 dose of torsemide since discharge from the hospital 4 days ago. Current Meds  Medication Sig   albuterol (VENTOLIN HFA) 108 (90 Base) MCG/ACT inhaler Inhale 2 puffs into the lungs every 4 (four) hours as needed for shortness of breath.   aspirin 81 MG chewable tablet Chew 81 mg by mouth daily.   atorvastatin (LIPITOR) 80 MG tablet TAKE 1 TABLET BY MOUTH EVERY DAY (Patient taking differently: Take 80 mg by mouth daily.)   esomeprazole (NEXIUM) 40 MG capsule Take 1 capsule (40 mg total) by mouth daily as needed (for heartburn or indigestion). (Patient taking differently: Take 40 mg by mouth daily.)   gabapentin (NEURONTIN) 400 MG capsule TAKE 1 CAPSULE BY MOUTH  TWICE DAILY (Patient taking differently: Take 400 mg by mouth at bedtime.)   ipratropium-albuterol (DUONEB) 0.5-2.5 (3) MG/3ML SOLN Take 3 mLs by nebulization every 6 (six) hours as needed. (Patient taking differently: Take 3 mLs by nebulization every 6 (six) hours as needed (shortness of breath).)   isosorbide mononitrate (IMDUR) 30 MG 24 hr tablet TAKE 1 TABLET(30 MG) BY MOUTH DAILY (Patient taking differently: Take 30 mg by mouth daily.)   letrozole (FEMARA) 2.5 MG tablet TAKE 1 TABLET(2.5 MG) BY MOUTH DAILY (Patient taking differently: Take 2.5 mg by mouth daily. TAKE 1 TABLET(2.5 MG) BY MOUTH DAILY)   losartan (COZAAR) 50 MG tablet  Take 1 tablet (50 mg total) by mouth daily.   metoprolol succinate (TOPROL XL) 25 MG 24 hr tablet Take 1 tablet (25 mg total) by mouth daily.   mirabegron ER (MYRBETRIQ) 50 MG TB24 tablet Take 1 tablet (50 mg total) by mouth daily.   oxyCODONE (ROXICODONE) 15 MG immediate release tablet Take 15 mg by mouth every 6 (six) hours as needed for pain.   OXYGEN Inhale 2-3 L into the lungs daily.   potassium chloride SA (KLOR-CON M) 20 MEQ tablet TAKE 1 TABLET BY MOUTH DAILY   PRESCRIPTION MEDICATION Inhale into the lungs at bedtime. cpap    Semaglutide,0.25 or 0.'5MG'$ /DOS, 2 MG/3ML SOPN Inject 0.5 Units into the skin once a week. (Patient taking differently: Inject 0.5 Units into the skin once a week. Wednesday)   torsemide (DEMADEX) 20 MG tablet Take 2 tablets by mouth daily, you may take an extra 1/2 tablet only as needed for weight gain of 2 lbs overnight or 5 lbs in a week   TRELEGY ELLIPTA 100-62.5-25 MCG/ACT AEPB INHALE 1 PUFF INTO THE LUNGS DAILY   XARELTO 20 MG TABS tablet TAKE 1 TABLET(20 MG) BY MOUTH EVERY MORNING (Patient taking differently: Take 20 mg by mouth in the morning.)   Allergies: Allergies as of 01/08/2022   (No Known Allergies)   Past Medical History:  Diagnosis Date   A-fib ([place])    Anxiety    Arthritis    "qwhre; joints, back" (04/17/2017)   Benign breast cyst in female, left 01/08/2017   Found by Screening mammogram, evaluated by [facility] on 01/08/17 and determined to be a benign simple breast cyst.   Breast cancer ([place])    Cellulitis of left lower leg 05/30/2017   CHF (congestive heart failure) (HCC)    Chronic low back pain 08/21/2016   Chronic lower back pain    Chronic venous insufficiency    /notes 05/30/2017   COPD (chronic obstructive pulmonary disease) ([place])    Depression    Diabetes mellitus without complication ([place])    DVT (deep venous thrombosis) ([place]) 11/16/2016   Dysrhythmia    GERD (gastroesophageal reflux disease)    Headache    "weekly for the last 3 months" (04/17/2017)   Hyperlipidemia    Hypertension    Morbid obesity ([place])    PE (pulmonary embolism)    Pulmonary embolism ([place]) 09/21/2014   Sleep apnea     Family History: Noncontributory.  Social History: Lives with daughter in [place].  Active in her community with political engagements.  Has a support of her family.  Her primary care physician is Dr. Buddy Duty.  She quit smoking 6 days ago.  Review of Systems: A complete ROS was negative except as per HPI.   Physical Exam: Blood pressure 109/64, pulse (!) 106,  temperature 98.9 F (37.2 C), temperature source Oral, resp. rate (!) 28, SpO2 95 %. General: Receiving nebulizer treatment in between the paroxysms of coughing. Cardiovascular: Regular tachycardia.  Left radial pulse normal. Respiratory: Normal work of breathing.  Lungs with coarse crackles throughout that clears somewhat with coughing.  Some coarse expiratory wheezing in upper lobes Extremities: Bilateral lower extremity swelling.  Minimal pitting edema.  Hyperpigmentation. Skin: Warm and dry. Neuro: Alert.  No gross focal deficits. Psych: Pleasant.  Appropriate mood and affect.  EKG: Atrial fibrillation with tachycardia.  CXR: Interval resolution of bibasilar airspace opacities when compared to CXR from 01/04/2022.  No focal consolidation.  No pleural effusions.  Labs: Notable for potassium of  2.9, creatinine 2.03, WBC 11.3, lactic acid 1.2, negative for COVID and flu.  Assessment & Plan  Sharon Vasquez is a 71 y.o. with a history of CHF, COPD, and A-fib who presents with 4 days of cough productive of sputum, sinus pressure, and shortness of breath who was admitted for sepsis due to a respiratory infection, and also found to have an AKI.  Principal Problem:   Sepsis ([place]) Active Problems:   COPD (chronic obstructive pulmonary disease) (HCC)   (HFpEF) heart failure with preserved ejection fraction (HCC)   Atrial fibrillation (HCC)   Acute upper respiratory infection   Acute kidney injury ([place])   Diabetes ([place])   Sepsis due to acute upper respiratory infection Patient's history of cough productive of sputum with sinus pain and pressure ongoing for 4 days, associated with fever but without evidence of focal consolidation in the lungs is suggestive of an acute upper respiratory infection with concomitant rhinosinusitis, likely due to a viral illness.  Patient's increased sputum production is likely due to her underlying COPD, which is potentially being exacerbated, rather than an acute  bacterial infection.  However, because of her risk factors and presenting fever, I will continue treatment with antibiotics, anticipating early discontinuation.  Currently stable and not requiring more than home oxygen rate.  Low index of suspicion for CHF exacerbation, as the patient has signs of hypovolemia rather than volume overload.  This patient's clinical syndrome is more suggestive of an infectious etiology rather than ACS or PE. - Obtain procalcitonin, strep pneumo urinary antigen, respiratory panel, sputum culture - Follow-up blood cultures although low index of suspicion for bacteremia - Morning CBC - Continue IV ceftriaxone and azithromycin for now - Continue guaifenesin  AKI History is notable for diarrhea and poor p.o. intake over the last 4 days.  Patient was also discharged recently after aggressive diuresis for heart failure exacerbation.  On exam she is somewhat hypotensive and appears dry with decreased skin turgor.  Creatinine elevated to 2 from a baseline around 1.  I suspect that her AKI is prerenal and will respond well to gentle hydration.  Will be cautious with IV fluids in the setting of her heart failure. - Start LR infusion at 100 mL/h for 3 hours for total of 300 mL - Obtain renal ultrasound and follow-up urinalysis - Follow-up morning BMP - Hold diuretics  COPD Minimal wheezing on exam, thus I do not suspect that COPD exacerbation is playing a major role in her current clinical syndrome.  We will continue with as needed nebs for now and will consider adding on prednisone if her wheezing worsens. - Continue DuoNebs as needed for wheezing and shortness of breath - Continue home LAMA/ICS  HFpEF Chronic and stable.  Although hypervolemic, this warrants that we use caution with IV fluids.  A-fib Chronic and stable - Continue Xarelto 20 mg daily  Hypertension Hold blood pressure medications as patient was somewhat hypotensive on arrival to the hospital.  Diet:  Heart Healthy IVF: LR,Bolus 300 mL VTE:  Therapeutic anticoagulation for A-fib Code: Full Surrogate: Daughters  Dispo: Admit patient to Inpatient with expected length of stay greater than 2 midnights.  Signed: Nani Gasser MD 01/08/2022, 6:32 PM  Pager: [phone] After 5pm on weekdays and 1pm on weekends: On Call pager: [phone]

## 2022-01-08 NOTE — ED Provider Notes (Signed)
[facility] EMERGENCY DEPARTMENT Provider Note   CSN: [id number] Arrival date & time: 01/08/22  1304     History  Chief Complaint  Patient presents with   Shortness of Breath    Sharon Vasquez is a 71 y.o. female.  Patient is a 71 year old female with a history of hypertension, COPD recently stopped smoking 6 days ago, PE, chronic atrial fibrillation on anticoagulation, CHF with recent hospitalization for diuresis and left AMA on Wednesday who is on chronic oxygen therapy at home and taking torsemide presenting today with EMS for worsening productive cough and shortness of breath.  Patient reports her symptoms started on Thursday the day after she left the hospital.  She has had worsening mucus production, cough and shortness of breath.  She has not been aware of having a fever reports she has had a poor appetite and feels generally weak.  She feels like the swelling in her legs are improved but she did not take her torsemide yesterday because she felt so weak.  She has been coughing and gagging on mucus but denies any abdominal pain, vomiting or diarrhea.  The history is provided by the patient and medical records.  Shortness of Breath      Home Medications Prior to Admission medications   Medication Sig Start Date End Date Taking? Authorizing Provider  albuterol (VENTOLIN HFA) 108 (90 Base) MCG/ACT inhaler Inhale 2 puffs into the lungs every 4 (four) hours as needed for shortness of breath. 08/25/21   Timothy Lasso, MD  aspirin 81 MG chewable tablet Chew 81 mg by mouth daily. 12/15/20   [provider]  atorvastatin (LIPITOR) 80 MG tablet TAKE 1 TABLET BY MOUTH EVERY DAY Patient taking differently: Take 80 mg by mouth daily. 12/02/21   Atway, Rayann N, DO  esomeprazole (NEXIUM) 40 MG capsule Take 1 capsule (40 mg total) by mouth daily as needed (for heartburn or indigestion). Patient taking differently: Take 40 mg by mouth daily. 01/20/19   Marcelyn Bruins, MD  Fluticasone-Umeclidin-Vilant (TRELEGY ELLIPTA) 100-62.5-25 MCG/ACT AEPB Inhale 1 puff into the lungs daily. 06/22/21   Rigoberto Noel, MD  gabapentin (NEURONTIN) 400 MG capsule TAKE 1 CAPSULE BY MOUTH  TWICE DAILY Patient taking differently: Take 400 mg by mouth at bedtime. 10/05/21   Atway, Rayann N, DO  ipratropium-albuterol (DUONEB) 0.5-2.5 (3) MG/3ML SOLN Take 3 mLs by nebulization every 6 (six) hours as needed. 06/18/20   Martyn Ehrich, NP  isosorbide mononitrate (IMDUR) 30 MG 24 hr tablet TAKE 1 TABLET(30 MG) BY MOUTH DAILY Patient taking differently: Take 30 mg by mouth daily. 12/29/21   Atway, Rayann N, DO  letrozole (FEMARA) 2.5 MG tablet TAKE 1 TABLET(2.5 MG) BY MOUTH DAILY Patient taking differently: Take 2.5 mg by mouth daily. TAKE 1 TABLET(2.5 MG) BY MOUTH DAILY 11/28/21   Nicholas Lose, MD  losartan (COZAAR) 50 MG tablet Take 1 tablet (50 mg total) by mouth daily. 12/02/21 12/02/22  Masters, Katie, DO  metoprolol succinate (TOPROL XL) 25 MG 24 hr tablet Take 1 tablet (25 mg total) by mouth daily. 12/02/21   Atway, Rayann N, DO  mirabegron ER (MYRBETRIQ) 50 MG TB24 tablet Take 1 tablet (50 mg total) by mouth daily. 11/17/21   Atway, Jeananne Rama, DO  naloxone (NARCAN) nasal spray 4 mg/0.1 mL Place 1 spray into the nose once as needed. for opioid overdose Patient not taking: Reported on 01/04/2022 11/02/21   [provider]  nicotine (NICOTROL) 10 MG inhaler  Inhale 1 continuous puffing into the lungs as needed.    [provider]  oxyCODONE (ROXICODONE) 15 MG immediate release tablet Take 15 mg by mouth every 6 (six) hours as needed for pain. 08/05/20   [provider]  OXYGEN Inhale 2-3 L into the lungs daily.    [provider]  potassium chloride SA (KLOR-CON M) 20 MEQ tablet TAKE 1 TABLET BY MOUTH DAILY 12/29/21   Atway, Rayann N, DO  PRESCRIPTION MEDICATION Inhale into the lungs at bedtime. cpap    [provider]  Semaglutide,0.25 or  0.'5MG'$ /DOS, 2 MG/3ML SOPN Inject 0.5 Units into the skin once a week. Patient taking differently: Inject 0.5 Units into the skin once a week. Wednesday 12/02/21   Masters, Joellen Jersey, DO  torsemide (DEMADEX) 20 MG tablet Take 2 tablets by mouth daily, you may take an extra 1/2 tablet only as needed for weight gain of 2 lbs overnight or 5 lbs in a week 12/02/21   Masters, Katie, DO  TRELEGY ELLIPTA 100-62.5-25 MCG/ACT AEPB INHALE 1 PUFF INTO THE LUNGS DAILY 11/16/21   Rigoberto Noel, MD  XARELTO 20 MG TABS tablet TAKE 1 TABLET(20 MG) BY MOUTH EVERY MORNING Patient taking differently: Take 20 mg by mouth in the morning. 01/27/21   Harvie Heck, MD      Allergies    Patient has no known allergies.    Review of Systems   Review of Systems  Respiratory:  Positive for shortness of breath.     Physical Exam Updated Vital Signs BP 121/71   Pulse (!) 106   Temp 98.9 F (37.2 C) (Oral)   Resp (!) 24   SpO2 94%  Physical Exam Vitals and nursing note reviewed.  Constitutional:      General: She is not in acute distress.    Appearance: She is well-developed.     Comments: dissheveled  HENT:     Head: Normocephalic and atraumatic.     Mouth/Throat:     Mouth: Mucous membranes are dry.  Eyes:     Pupils: Pupils are equal, round, and reactive to light.  Cardiovascular:     Rate and Rhythm: Tachycardia present. Rhythm irregularly irregular.     Heart sounds: Normal heart sounds. No murmur heard.    No friction rub.  Pulmonary:     Effort: Pulmonary effort is normal. Tachypnea present.     Breath sounds: Wheezing and rhonchi present. No rales.  Abdominal:     General: Bowel sounds are normal. There is no distension.     Palpations: Abdomen is soft.     Tenderness: There is no abdominal tenderness. There is no guarding or rebound.  Musculoskeletal:        General: No tenderness. Normal range of motion.     Right lower leg: Edema present.     Left lower leg: Edema present.     Comments: 1+ edema  in bilateral lower ext.  Skin changes consistent with chronic venous stasis  Skin:    General: Skin is warm and dry.     Findings: No rash.  Neurological:     Mental Status: She is alert and oriented to person, place, and time.     Cranial Nerves: No cranial nerve deficit.  Psychiatric:        Mood and Affect: Mood normal.        Behavior: Behavior normal.     ED Results / Procedures / Treatments   Labs (all labs ordered are  listed, but only abnormal results are displayed) Labs Reviewed  COMPREHENSIVE METABOLIC PANEL - Abnormal; Notable for the following components:      Result Value   Potassium 2.9 (*)    Chloride 91 (*)    Glucose, Bld 130 (*)    BUN 45 (*)    Creatinine, Ser 2.03 (*)    Total Protein 9.4 (*)    Total Bilirubin 1.3 (*)    GFR, Estimated 26 (*)    Anion gap 16 (*)    All other components within normal limits  CBC WITH DIFFERENTIAL/PLATELET - Abnormal; Notable for the following components:   WBC 11.3 (*)    Platelets 471 (*)    All other components within normal limits  PROTIME-INR - Abnormal; Notable for the following components:   Prothrombin Time 16.4 (*)    INR 1.3 (*)    All other components within normal limits  RESP PANEL BY RT-PCR (FLU A&B, COVID) ARPGX2  CULTURE, BLOOD (ROUTINE X 2)  CULTURE, BLOOD (ROUTINE X 2)  URINE CULTURE  LACTIC ACID, PLASMA  APTT  LACTIC ACID, PLASMA  URINALYSIS, ROUTINE W REFLEX MICROSCOPIC    EKG None ED ECG REPORT   Date: 01/08/2022  Rate: 107  Rhythm: atrial fibrillation  QRS Axis: normal  Intervals:  afib  ST/T Wave abnormalities: normal  Conduction Disutrbances:none  Narrative Interpretation:   Old EKG Reviewed: unchanged  I have personally reviewed the EKG tracing and agree with the computerized printout as noted.    Radiology DG Chest Port 1 View  Result Date: 01/08/2022 CLINICAL DATA:  Possible sepsis. Increased shortness of breath. History of COPD and congestive heart failure. EXAM:  PORTABLE CHEST 1 VIEW COMPARISON:  Radiographs 01/04/2022 and 11/22/2021.  CT 05/12/2021. FINDINGS: 1418 hours. Stable moderate cardiac enlargement. Previously demonstrated patchy airspace opacities at both lung bases have resolved. The lungs now appear clear. There is no pleural effusion or pneumothorax. No acute osseous findings are evident. Telemetry leads overlie the chest. IMPRESSION: Interval resolution of bibasilar airspace opacities. No acute cardiopulmonary process. Stable cardiac enlargement. Electronically Signed   By: Richardean Sale M.D.   On: 01/08/2022 14:31    Procedures Procedures    Medications Ordered in ED Medications  cefTRIAXone (ROCEPHIN) 2 g in sodium chloride 0.9 % 100 mL IVPB (0 g Intravenous Stopped 01/08/22 1516)  azithromycin (ZITHROMAX) 500 mg in sodium chloride 0.9 % 250 mL IVPB (500 mg Intravenous New Bag/Given 01/08/22 1523)  oxyCODONE-acetaminophen (PERCOCET/ROXICET) 5-325 MG per tablet 1 tablet (has no administration in time range)  acetaminophen (TYLENOL) tablet 1,000 mg (1,000 mg Oral Given 01/08/22 1344)  albuterol (PROVENTIL) (2.5 MG/3ML) 0.083% nebulizer solution 5 mg (5 mg Nebulization Given 01/08/22 1344)  ipratropium (ATROVENT) nebulizer solution 0.5 mg (0.5 mg Nebulization Given 01/08/22 1344)    ED Course/ Medical Decision Making/ A&P                           Medical Decision Making Amount and/or Complexity of Data Reviewed External Data Reviewed: notes.    Details: Recent hospitalization Labs: ordered. Decision-making details documented in ED Course. Radiology: ordered and independent interpretation performed. Decision-making details documented in ED Course. ECG/medicine tests: ordered and independent interpretation performed. Decision-making details documented in ED Course.  Risk OTC drugs. Prescription drug management.   Pt with multiple medical problems and comorbidities and presenting today with a complaint that caries a high risk for  morbidity and mortality.  Here today with  productive cough, shortness of breath.  Patient still has some edema in her lower extremities but per her this is improved.  She was found to have a fever here of 101.9.  She has a rhonchi and wheezing diffusely through all lung fields.  Concern for sepsis at this time.  Sepsis order set initiated however fluid was held due to recent diuresis due to CHF exacerbation.  Concern for COPD exacerbation, pneumonia, COVID or other viral etiology.  Low suspicion for PE as patient is anticoagulated and symptoms are more classic for infectious etiology.  Patient given Rocephin and azithromycin.  Also given albuterol and Atrovent.  Labs and imaging are pending.  Patient is currently satting 94% on her home 4 L.  4:11 PM I independently interpreted patient's EKG and labs.  EKG without acute changes.  CBC with mild leukocytosis of 11 with normal hemoglobin, CMP with new hypokalemia today of 2.9 and anion gap of 16.  This could be related to recent diuresis while hospitalized and using torsemide at home and the addition of being ill and having poor oral intake.  Lactic acid today is within normal limits coags without acute findings.  I have independently visualized and interpreted pt's images today.  Chest x-ray with fairly poor quality but no significant fluid overload or lobar pneumonia at this time.  Patient did have some improvement in her breathing after albuterol and Atrovent.  Feel that patient will need admission for sepsis and ongoing care.  Will need continued monitoring of her renal function.  Will admit to internal medicine teaching service who were recently caring for the patient.  She is comfortable with this plan.          Final Clinical Impression(s) / ED Diagnoses Final diagnoses:  Sepsis with acute renal failure without septic shock, due to unspecified organism, unspecified acute renal failure type ([place])  AKI (acute kidney injury) ([place])  Community  acquired pneumonia, unspecified laterality    Rx / DC Orders ED Discharge Orders     None         Blanchie Dessert, MD 01/08/22 1611

## 2022-01-08 NOTE — ED Notes (Signed)
Received verbal report from Vergia Alcon RN at this time

## 2022-01-08 NOTE — ED Triage Notes (Signed)
Pt bib ems from home c/o increased SOB. Pt has hx COPD, CHF, and Afib (Xarelto). Pt left hospital last Wednesday d/t COPD/CHF. Pt d/c early d/t personal things. Pt denies CP/N/V/D  Pt has thick congestion and green/yellow sputum.   Pt wears CPAP and O2 (2-3L) at home.  BP 150/80 RR 24 HR 110

## 2022-01-09 DIAGNOSIS — Z853 Personal history of malignant neoplasm of breast: Secondary | ICD-10-CM | POA: Diagnosis not present

## 2022-01-09 DIAGNOSIS — F32A Depression, unspecified: Secondary | ICD-10-CM | POA: Diagnosis present

## 2022-01-09 DIAGNOSIS — J441 Chronic obstructive pulmonary disease with (acute) exacerbation: Secondary | ICD-10-CM | POA: Diagnosis present

## 2022-01-09 DIAGNOSIS — B9789 Other viral agents as the cause of diseases classified elsewhere: Secondary | ICD-10-CM | POA: Diagnosis present

## 2022-01-09 DIAGNOSIS — N179 Acute kidney failure, unspecified: Secondary | ICD-10-CM | POA: Diagnosis present

## 2022-01-09 DIAGNOSIS — J00 Acute nasopharyngitis [common cold]: Secondary | ICD-10-CM | POA: Diagnosis not present

## 2022-01-09 DIAGNOSIS — I5032 Chronic diastolic (congestive) heart failure: Secondary | ICD-10-CM | POA: Diagnosis present

## 2022-01-09 DIAGNOSIS — F17210 Nicotine dependence, cigarettes, uncomplicated: Secondary | ICD-10-CM

## 2022-01-09 DIAGNOSIS — Z20822 Contact with and (suspected) exposure to covid-19: Secondary | ICD-10-CM | POA: Diagnosis present

## 2022-01-09 DIAGNOSIS — Z86711 Personal history of pulmonary embolism: Secondary | ICD-10-CM | POA: Diagnosis not present

## 2022-01-09 DIAGNOSIS — G4733 Obstructive sleep apnea (adult) (pediatric): Secondary | ICD-10-CM | POA: Diagnosis not present

## 2022-01-09 DIAGNOSIS — I110 Hypertensive heart disease with heart failure: Secondary | ICD-10-CM | POA: Diagnosis present

## 2022-01-09 DIAGNOSIS — M545 Low back pain, unspecified: Secondary | ICD-10-CM | POA: Diagnosis present

## 2022-01-09 DIAGNOSIS — Z7901 Long term (current) use of anticoagulants: Secondary | ICD-10-CM | POA: Diagnosis not present

## 2022-01-09 DIAGNOSIS — J449 Chronic obstructive pulmonary disease, unspecified: Secondary | ICD-10-CM

## 2022-01-09 DIAGNOSIS — N281 Cyst of kidney, acquired: Secondary | ICD-10-CM | POA: Diagnosis present

## 2022-01-09 DIAGNOSIS — I482 Chronic atrial fibrillation, unspecified: Secondary | ICD-10-CM | POA: Diagnosis present

## 2022-01-09 DIAGNOSIS — E876 Hypokalemia: Secondary | ICD-10-CM | POA: Diagnosis present

## 2022-01-09 DIAGNOSIS — M170 Bilateral primary osteoarthritis of knee: Secondary | ICD-10-CM | POA: Diagnosis not present

## 2022-01-09 DIAGNOSIS — J069 Acute upper respiratory infection, unspecified: Secondary | ICD-10-CM | POA: Diagnosis present

## 2022-01-09 DIAGNOSIS — Z6841 Body Mass Index (BMI) 40.0 and over, adult: Secondary | ICD-10-CM | POA: Diagnosis not present

## 2022-01-09 DIAGNOSIS — E119 Type 2 diabetes mellitus without complications: Secondary | ICD-10-CM | POA: Diagnosis present

## 2022-01-09 DIAGNOSIS — G4730 Sleep apnea, unspecified: Secondary | ICD-10-CM | POA: Diagnosis present

## 2022-01-09 DIAGNOSIS — Z9981 Dependence on supplemental oxygen: Secondary | ICD-10-CM | POA: Diagnosis not present

## 2022-01-09 DIAGNOSIS — Z87891 Personal history of nicotine dependence: Secondary | ICD-10-CM | POA: Diagnosis not present

## 2022-01-09 DIAGNOSIS — F419 Anxiety disorder, unspecified: Secondary | ICD-10-CM | POA: Diagnosis present

## 2022-01-09 DIAGNOSIS — J9611 Chronic respiratory failure with hypoxia: Secondary | ICD-10-CM | POA: Diagnosis present

## 2022-01-09 DIAGNOSIS — E785 Hyperlipidemia, unspecified: Secondary | ICD-10-CM | POA: Diagnosis present

## 2022-01-09 LAB — BASIC METABOLIC PANEL
Anion gap: 10 (ref 5–15)
BUN: 33 mg/dL — ABNORMAL HIGH (ref 8–23)
CO2: 29 mmol/L (ref 22–32)
Calcium: 8.7 mg/dL — ABNORMAL LOW (ref 8.9–10.3)
Chloride: 97 mmol/L — ABNORMAL LOW (ref 98–111)
Creatinine, Ser: 1.31 mg/dL — ABNORMAL HIGH (ref 0.44–1.00)
GFR, Estimated: 43 mL/min — ABNORMAL LOW (ref >60)
Glucose, Bld: 193 mg/dL — ABNORMAL HIGH (ref 70–99)
Potassium: 2.8 mmol/L — ABNORMAL LOW (ref 3.5–5.1)
Sodium: 136 mmol/L (ref 135–145)

## 2022-01-09 LAB — URINE CULTURE
Culture: NO GROWTH
Special Requests: NORMAL

## 2022-01-09 LAB — CBG MONITORING, ED: Glucose-Capillary: 108 mg/dL — ABNORMAL HIGH (ref 70–99)

## 2022-01-09 MED ORDER — PREDNISONE 20 MG PO TABS
40.0000 mg | ORAL_TABLET | Freq: Every day | ORAL | Status: DC
  Administered 2022-01-09: 40 mg via ORAL
  Filled 2022-01-09: qty 2

## 2022-01-09 MED ORDER — MAGNESIUM SULFATE 2 GM/50ML IV SOLN
2.0000 g | Freq: Once | INTRAVENOUS | Status: AC
  Administered 2022-01-09: 2 g via INTRAVENOUS
  Filled 2022-01-09: qty 50

## 2022-01-09 MED ORDER — POTASSIUM CHLORIDE CRYS ER 20 MEQ PO TBCR
40.0000 meq | EXTENDED_RELEASE_TABLET | Freq: Four times a day (QID) | ORAL | Status: AC
  Administered 2022-01-09 (×2): 40 meq via ORAL
  Filled 2022-01-09 (×2): qty 2

## 2022-01-09 MED ORDER — GUAIFENESIN-DM 100-10 MG/5ML PO SYRP: 5.0000 mL | ORAL_SOLUTION | ORAL | Status: DC | PRN

## 2022-01-09 MED ORDER — PREDNISONE 20 MG PO TABS
40.0000 mg | ORAL_TABLET | Freq: Every day | ORAL | Status: DC
  Administered 2022-01-10: 40 mg via ORAL
  Filled 2022-01-09: qty 2

## 2022-01-09 MED ORDER — MAGNESIUM SULFATE 50 % IJ SOLN: 2.0000 g | Freq: Once | INTRAMUSCULAR | Status: DC

## 2022-01-09 MED ORDER — FLUTICASONE PROPIONATE 50 MCG/ACT NA SUSP
1.0000 | Freq: Every day | NASAL | Status: DC
  Administered 2022-01-10: 1 via NASAL
  Filled 2022-01-09: qty 16

## 2022-01-09 NOTE — ED Notes (Signed)
ED TO INPATIENT HANDOFF REPORT   S Name/Age/Gender Sharon Vasquez 71 y.o. female Room/Bed: 038C/038C  Code Status   Code Status: Full Code  Home/SNF/Other Home Patient oriented to: self, place, time, and situation Is this baseline? Yes      Chief Complaint Sepsis ([place]) [A41.9]  Triage Note Pt bib ems from home c/o increased SOB. Pt has hx COPD, CHF, and Afib (Xarelto). Pt left hospital last Wednesday d/t COPD/CHF. Pt d/c early d/t personal things. Pt denies CP/N/V/D  Pt has thick congestion and green/yellow sputum.   Pt wears CPAP and O2 (2-3L) at home.  BP 150/80 RR 24 HR 110    Allergies No Known Allergies  Level of Care/Admitting Diagnosis ED Disposition     ED Disposition  Admit   Condition  --   Comment  Hospital Area: [facility] [[id number]]  Level of Care: Telemetry Medical [[id number]]  May admit patient to [name] or [facility] if equivalent level of care is available:: No  Covid Evaluation: Confirmed COVID Negative  Diagnosis: Sepsis [facility]) [[id number]]  Admitting Physician: Aldine Contes [[id number]]  Attending Physician: Aldine Contes [[id number]]  Certification:: I certify this patient will need inpatient services for at least 2 midnights  Estimated Length of Stay: 2          B Medical/Surgery History Past Medical History:  Diagnosis Date   A-fib ([place])    Anxiety    Arthritis    "qwhre; joints, back" (04/17/2017)   Benign breast cyst in female, left 01/08/2017   Found by Screening mammogram, evaluated by U/S on 01/08/17 and determined to be a benign simple breast cyst.   Breast cancer ([place])    Cellulitis of left lower leg 05/30/2017   CHF (congestive heart failure) ([place])    Chronic low back pain 08/21/2016   Chronic lower back pain    Chronic venous insufficiency    /notes 05/30/2017   COPD (chronic obstructive pulmonary disease) ([place])    Depression    Diabetes mellitus without complication ([place])    DVT (deep venous  thrombosis) ([place]) 11/16/2016   Dysrhythmia    GERD (gastroesophageal reflux disease)    Headache    "weekly for the last 3 months" (04/17/2017)   Hyperlipidemia    Hypertension    Morbid obesity ([place])    PE (pulmonary embolism)    Pulmonary embolism ([place]) 09/21/2014   Sleep apnea    Past Surgical History:  Procedure Laterality Date   ABDOMINAL HYSTERECTOMY     APPENDECTOMY     BALLOON DILATION N/A 03/09/2020   Procedure: BALLOON DILATION;  Surgeon: Otis Brace, MD;  [facility]: [facility];  Service: Gastroenterology;  Laterality: N/A;   BIOPSY  03/09/2020   Procedure: BIOPSY;  Surgeon: Otis Brace, MD;  [facility];  Service: Gastroenterology;;   BIOPSY  04/12/2021   Procedure: BIOPSY;  Surgeon: Otis Brace, MD;  [facility];  Service: Gastroenterology;;   BREAST BIOPSY Right 03/11/2021   US biopsy/ coil clip/ path pending   BREAST BIOPSY Right 03/11/2021   US biopsy/ ribbone clip/ path pending   BREAST CYST EXCISION Left    "six o'clock"   BREAST LUMPECTOMY Left    BREAST LUMPECTOMY WITH RADIOACTIVE SEED LOCALIZATION Right 05/02/2021   Procedure: RIGHT BREAST LUMPECTOMY WITH RADIOACTIVE SEED LOCALIZATION;  Surgeon: Jovita Kussmaul, MD;  Location: [facility];  Service: General;  Laterality: Right;   CHOLECYSTECTOMY     DILATION AND CURETTAGE OF UTERUS  ESOPHAGOGASTRODUODENOSCOPY (EGD) WITH PROPOFOL N/A 03/09/2020   Procedure: ESOPHAGOGASTRODUODENOSCOPY (EGD) WITH PROPOFOL;  Surgeon: Otis Brace, MD;  [facility];  Service: Gastroenterology;  Laterality: N/A;   ESOPHAGOGASTRODUODENOSCOPY (EGD) WITH PROPOFOL N/A 04/12/2021   Procedure: ESOPHAGOGASTRODUODENOSCOPY (EGD) WITH PROPOFOL;  Surgeon: Otis Brace, MD;  [facility];  Service: Gastroenterology;  Laterality: N/A;   IR ABLATE LIVER CRYOABLATION  07/23/2019   IR RADIOLOGIST EVAL & MGMT  07/18/2019   SAVORY DILATION N/A 04/12/2021   Procedure: SAVORY DILATION;   Surgeon: Otis Brace, MD;  [facility];  Service: Gastroenterology;  Laterality: N/A;   TONSILLECTOMY AND ADENOIDECTOMY     TUBAL LIGATION       A IV Location/Drains/Wounds Patient Lines/Drains/Airways Status     Active Line/Drains/Airways     Name Placement date Placement time Site Days   Peripheral IV 01/08/22 22 G 1" Anterior;Left Forearm 01/08/22  1418  Forearm  1   Peripheral IV 01/08/22 20 G 1.88" Anterior;Right;Lateral Forearm 01/08/22  1503  Forearm  1   Incision (Closed) 05/02/21 Breast Right 05/02/21  1304  -- 252            Intake/Output Last 24 hours  Intake/Output Summary (Last 24 hours) at 01/09/2022 1512 Last data filed at 01/09/2022 7616 Gross per 24 hour  Intake 679.88 ml  Output --  Net 679.88 ml    Labs/Imaging Results for orders placed or performed during the hospital encounter of 01/08/22 (from the past 48 hour(s))  Resp Panel by RT-PCR (Flu A&B, Covid) Anterior Nasal Swab     Status: None   Collection Time: 01/08/22  1:16 PM   Specimen: Anterior Nasal Swab  Result Value Ref Range   SARS Coronavirus 2 by RT PCR NEGATIVE NEGATIVE    Comment: (NOTE) SARS-CoV-2 target nucleic acids are NOT DETECTED.  The SARS-CoV-2 RNA is generally detectable in upper respiratory specimens during the acute phase of infection. The lowest concentration of SARS-CoV-2 viral copies this assay can detect is 138 copies/mL. A negative result does not preclude SARS-Cov-2 infection and should not be used as the sole basis for treatment or other patient management decisions. A negative result may occur with  improper specimen collection/handling, submission of specimen other than nasopharyngeal swab, presence of viral mutation(s) within the areas targeted by this assay, and inadequate number of viral copies(<138 copies/mL). A negative result must be combined with clinical observations, patient history, and epidemiological information. The expected result is  Negative.  Fact Sheet for Patients:  EntrepreneurPulse.com.au  Fact Sheet for Healthcare Providers:  IncredibleEmployment.be  This test is no t yet approved or cleared by the [place] FDA and  has been authorized for detection and/or diagnosis of SARS-CoV-2 by FDA under an Emergency Use Authorization (EUA). This EUA will remain  in effect (meaning this test can be used) for the duration of the COVID-19 declaration under Section 564(b)(1) of the Act, 21 U.S.C.section 360bbb-3(b)(1), unless the authorization is terminated  or revoked sooner.       Influenza A by PCR NEGATIVE NEGATIVE   Influenza B by PCR NEGATIVE NEGATIVE    Comment: (NOTE) The Xpert Xpress SARS-CoV-2/FLU/RSV plus assay is intended as an aid in the diagnosis of influenza from Nasopharyngeal swab specimens and should not be used as a sole basis for treatment. Nasal washings and aspirates are unacceptable for Xpert Xpress SARS-CoV-2/FLU/RSV testing.  Fact Sheet for Patients: EntrepreneurPulse.com.au  Fact Sheet for Healthcare Providers: IncredibleEmployment.be  This test is not yet approved or cleared by  the [employer] and has been authorized for detection and/or diagnosis of SARS-CoV-2 by FDA under an Emergency Use Authorization (EUA). This EUA will remain in effect (meaning this test can be used) for the duration of the COVID-19 declaration under Section 564(b)(1) of the Act, 21 U.S.C. section 360bbb-3(b)(1), unless the authorization is terminated or revoked.  Performed at [facility], [place] [address] [place], [place] [address]   Blood Culture (routine x 2)     Status: None (Preliminary result)   Collection Time: 01/08/22  1:16 PM   Specimen: BLOOD  Result Value Ref Range   Specimen Description BLOOD BLOOD RIGHT FOREARM    Special Requests      BOTTLES DRAWN AEROBIC AND ANAEROBIC Blood Culture results may not be  optimal due to an excessive volume of blood received in culture bottles   Culture      NO GROWTH < 24 HOURS Performed at [facility] [address], [place], [place] [address]    Report Status PENDING   Urinalysis, Routine w reflex microscopic     Status: Abnormal   Collection Time: 01/08/22  1:16 PM  Result Value Ref Range   Color, Urine AMBER (A) YELLOW    Comment: BIOCHEMICALS MAY BE AFFECTED BY COLOR   APPearance HAZY (A) CLEAR   Specific Gravity, Urine 1.021 1.005 - 1.030   pH 6.0 5.0 - 8.0   Glucose, UA NEGATIVE NEGATIVE mg/dL   Hgb urine dipstick NEGATIVE NEGATIVE   Bilirubin Urine NEGATIVE NEGATIVE   Ketones, ur NEGATIVE NEGATIVE mg/dL   Protein, ur 30 (A) NEGATIVE mg/dL   Nitrite NEGATIVE NEGATIVE   Leukocytes,Ua NEGATIVE NEGATIVE   RBC / HPF 0-5 0 - 5 RBC/hpf   WBC, UA 0-5 0 - 5 WBC/hpf   Bacteria, UA RARE (A) NONE SEEN   Squamous Epithelial / LPF 0-5 0 - 5    Comment: Performed at [facility], [place] [address], [place], [place] [address]  Urine Culture     Status: None   Collection Time: 01/08/22  1:16 PM   Specimen: In/Out Cath Urine  Result Value Ref Range   Specimen Description IN/OUT CATH URINE    Special Requests Normal    Culture      NO GROWTH Performed at [facility], [place] [address], [place], [place] [address]    Report Status 01/09/2022 FINAL   Strep pneumoniae urinary antigen     Status: None   Collection Time: 01/08/22  1:16 PM  Result Value Ref Range   Strep Pneumo Urinary Antigen NEGATIVE NEGATIVE    Comment: Performed at [facility] [address], [place], [place] [address]  Lactic acid, plasma     Status: None   Collection Time: 01/08/22  3:01 PM  Result Value Ref Range   Lactic Acid, Venous 1.2 0.5 - 1.9 mmol/L    Comment: Performed at [facility] [address], [place], [place] [address]  Comprehensive metabolic panel     Status: Abnormal   Collection Time: 01/08/22  3:01 PM  Result Value Ref Range    Sodium 137 135 - 145 mmol/L   Potassium 2.9 (L) 3.5 - 5.1 mmol/L   Chloride 91 (L) 98 - 111 mmol/L   CO2 30 22 - 32 mmol/L   Glucose, Bld 130 (H) 70 - 99 mg/dL    Comment: Glucose reference range applies only to samples taken after fasting for at least 8  hours.   BUN 45 (H) 8 - 23 mg/dL   Creatinine, Ser 2.03 (H) 0.44 - 1.00 mg/dL   Calcium 9.1 8.9 - 10.3 mg/dL   Total Protein 9.4 (H) 6.5 - 8.1 g/dL   Albumin 3.7 3.5 - 5.0 g/dL   AST 37 15 - 41 U/L   ALT 26 0 - 44 U/L   Alkaline Phosphatase 91 38 - 126 U/L   Total Bilirubin 1.3 (H) 0.3 - 1.2 mg/dL   GFR, Estimated 26 (L) >60 mL/min    Comment: (NOTE) Calculated using the CKD-EPI Creatinine Equation (2021)    Anion gap 16 (H) 5 - 15    Comment: Performed at [facility], [place] [address], [place], [place] [address]  CBC with Differential     Status: Abnormal   Collection Time: 01/08/22  3:01 PM  Result Value Ref Range   WBC 11.3 (H) 4.0 - 10.5 K/uL   RBC 4.71 3.87 - 5.11 MIL/uL   Hemoglobin 13.7 12.0 - 15.0 g/dL   HCT 39.1 36.0 - 46.0 %   MCV 83.0 80.0 - 100.0 fL   MCH 29.1 26.0 - 34.0 pg   MCHC 35.0 30.0 - 36.0 g/dL   RDW 13.7 11.5 - 15.5 %   Platelets 471 (H) 150 - 400 K/uL   nRBC 0.0 0.0 - 0.2 %   Neutrophils Relative % 68 %   Neutro Abs 7.6 1.7 - 7.7 K/uL   Lymphocytes Relative 24 %   Lymphs Abs 2.7 0.7 - 4.0 K/uL   Monocytes Relative 8 %   Monocytes Absolute 0.9 0.1 - 1.0 K/uL   Eosinophils Relative 0 %   Eosinophils Absolute 0.0 0.0 - 0.5 K/uL   Basophils Relative 0 %   Basophils Absolute 0.0 0.0 - 0.1 K/uL   Immature Granulocytes 0 %   Abs Immature Granulocytes 0.02 0.00 - 0.07 K/uL    Comment: Performed at [facility] [address] [place], [place] [address]  Protime-INR     Status: Abnormal   Collection Time: 01/08/22  3:01 PM  Result Value Ref Range   Prothrombin Time 16.4 (H) 11.4 - 15.2 seconds   INR 1.3 (H) 0.8 - 1.2    Comment: (NOTE) INR goal varies based on device and disease  states. Performed at [facility], [place] [address], [place], [place] [address]   APTT     Status: None   Collection Time: 01/08/22  3:01 PM  Result Value Ref Range   aPTT 29 24 - 36 seconds    Comment: Performed at [facility] [address], [place], [place] [address]  Brain natriuretic peptide     Status: None   Collection Time: 01/08/22  5:44 PM  Result Value Ref Range   B Natriuretic Peptide 46.9 0.0 - 100.0 pg/mL    Comment: Performed at [facility] [address], [place], [place] [address]  Procalcitonin - Baseline     Status: None   Collection Time: 01/08/22  5:45 PM  Result Value Ref Range   Procalcitonin <0.10 ng/mL    Comment:        Interpretation: PCT (Procalcitonin) <= 0.5 ng/mL: Systemic infection (sepsis) is not likely. Local bacterial infection is possible. (NOTE)       Sepsis PCT Algorithm           Lower Respiratory Tract  Infection PCT Algorithm    ----------------------------     ----------------------------         PCT < 0.25 ng/mL                PCT < 0.10 ng/mL          Strongly encourage             Strongly discourage   discontinuation of antibiotics    initiation of antibiotics    ----------------------------     -----------------------------       PCT 0.25 - 0.50 ng/mL            PCT 0.10 - 0.25 ng/mL               OR       >80% decrease in PCT            Discourage initiation of                                            antibiotics      Encourage discontinuation           of antibiotics    ----------------------------     -----------------------------         PCT >= 0.50 ng/mL              PCT 0.26 - 0.50 ng/mL               AND        <80% decrease in PCT             Encourage initiation of                                             antibiotics       Encourage continuation           of antibiotics    ----------------------------     -----------------------------        PCT >= 0.50  ng/mL                  PCT > 0.50 ng/mL               AND         increase in PCT                  Strongly encourage                                      initiation of antibiotics    Strongly encourage escalation           of antibiotics                                     -----------------------------                                           PCT <= 0.25 ng/mL  OR                                        > 80% decrease in PCT                                      Discontinue / Do not initiate                                             antibiotics  Performed at [facility], [place] [address] [place], [place] [address]   Respiratory (~20 pathogens) panel by PCR     Status: Abnormal   Collection Time: 01/08/22  5:45 PM   Specimen: Nasopharyngeal Swab; Respiratory  Result Value Ref Range   Adenovirus NOT DETECTED NOT DETECTED   Coronavirus 229E NOT DETECTED NOT DETECTED    Comment: (NOTE) The Coronavirus on the Respiratory Panel, DOES NOT test for the novel  Coronavirus (2019 nCoV)    Coronavirus HKU1 NOT DETECTED NOT DETECTED   Coronavirus NL63 NOT DETECTED NOT DETECTED   Coronavirus OC43 NOT DETECTED NOT DETECTED   Metapneumovirus NOT DETECTED NOT DETECTED   Rhinovirus / Enterovirus DETECTED (A) NOT DETECTED   Influenza A NOT DETECTED NOT DETECTED   Influenza B NOT DETECTED NOT DETECTED   Parainfluenza Virus 1 NOT DETECTED NOT DETECTED   Parainfluenza Virus 2 NOT DETECTED NOT DETECTED   Parainfluenza Virus 3 NOT DETECTED NOT DETECTED   Parainfluenza Virus 4 NOT DETECTED NOT DETECTED   Respiratory Syncytial Virus NOT DETECTED NOT DETECTED   Bordetella pertussis NOT DETECTED NOT DETECTED   Bordetella Parapertussis NOT DETECTED NOT DETECTED   Chlamydophila pneumoniae NOT DETECTED NOT DETECTED   Mycoplasma pneumoniae NOT DETECTED NOT DETECTED    Comment: Performed at [facility], [place] [address], [place], [place] [address]   Magnesium     Status: Abnormal   Collection Time: 01/08/22  5:45 PM  Result Value Ref Range   Magnesium 1.6 (L) 1.7 - 2.4 mg/dL    Comment: Performed at [facility] [address] [place], [place] [address]  Expectorated Sputum Assessment w Gram Stain, Rflx to Resp Cult     Status: None (Preliminary result)   Collection Time: 01/08/22 10:00 PM   Specimen: Sputum  Result Value Ref Range   Specimen Description SPUTUM    Special Requests NONE    Sputum evaluation      THIS SPECIMEN IS ACCEPTABLE FOR SPUTUM CULTURE Performed at [facility], [place] [address], [place], [place] [address]    Report Status PENDING   Culture, Respiratory w Gram Stain     Status: None (Preliminary result)   Collection Time: 01/08/22 10:00 PM   Specimen: SPU  Result Value Ref Range   Specimen Description SPUTUM    Special Requests NONE Reflexed from [id number]    Gram Stain      RARE WBC PRESENT, PREDOMINANTLY MONONUCLEAR NO ORGANISMS SEEN Performed at [facility], [place] [address] [place], [place] [address]    Culture PENDING    Report Status PENDING   CBG monitoring, ED     Status: Abnormal   Collection Time: 01/09/22  7:57 AM  Result Value Ref Range   Glucose-Capillary 108 (H) 70 - 99 mg/dL    Comment: Glucose reference range applies only to samples taken after fasting for at least 8 hours.  Basic metabolic panel     Status: Abnormal   Collection Time: 01/09/22  9:40 AM  Result Value Ref Range   Sodium 136 135 - 145 mmol/L   Potassium 2.8 (L) 3.5 - 5.1 mmol/L   Chloride 97 (L) 98 - 111 mmol/L   CO2 29 22 - 32 mmol/L   Glucose, Bld 193 (H) 70 - 99 mg/dL    Comment: Glucose reference range applies only to samples taken after fasting for at least 8 hours.   BUN 33 (H) 8 - 23 mg/dL   Creatinine, Ser 1.31 (H) 0.44 - 1.00 mg/dL   Calcium 8.7 (L) 8.9 - 10.3 mg/dL   GFR, Estimated 43 (L) >60 mL/min    Comment: (NOTE) Calculated using the CKD-EPI Creatinine Equation (2021)    Anion  gap 10 5 - 15    Comment: Performed at [facility] [address], [place], [place] [address]   US RENAL  Result Date: 01/08/2022 CLINICAL DATA:  Renal dysfunction EXAM: RENAL / URINARY TRACT ULTRASOUND COMPLETE COMPARISON:  None Available. FINDINGS: Right Kidney: Renal measurements: 10.8 x 5.3 x 6.1 cm = volume: 184.8 mL. There is no hydronephrosis. There is 4.9 x 5.2 x 4.6 cm lobulated cyst with thin internal septation in the upper midportion of right kidney. Left Kidney: Renal measurements: 12.2 x 5.1 x 5.2 cm = volume: 167.4 mL. Echogenicity within normal limits. No mass or hydronephrosis visualized. Bladder: Appears normal for degree of bladder distention. Other: According to the note by the technologist, examination was technically difficult due to patient's body habitus. IMPRESSION: There is no hydronephrosis. There is 5.2 cm cyst in the upper midportion of right kidney with thin internal septation. Follow-up multiphasic CT may be considered for further characterization. Electronically Signed   By: Elmer Picker M.D.   On: 01/08/2022 19:04   DG Chest Port 1 View  Result Date: 01/08/2022 CLINICAL DATA:  Possible sepsis. Increased shortness of breath. History of COPD and congestive heart failure. EXAM: PORTABLE CHEST 1 VIEW COMPARISON:  Radiographs 01/04/2022 and 11/22/2021.  CT 05/12/2021. FINDINGS: 1418 hours. Stable moderate cardiac enlargement. Previously demonstrated patchy airspace opacities at both lung bases have resolved. The lungs now appear clear. There is no pleural effusion or pneumothorax. No acute osseous findings are evident. Telemetry leads overlie the chest. IMPRESSION: Interval resolution of bibasilar airspace opacities. No acute cardiopulmonary process. Stable cardiac enlargement. Electronically Signed   By: Richardean Sale M.D.   On: 01/08/2022 14:31    Pending Labs Unresulted Labs (From admission, onward)     Start     Ordered   01/10/22 5809  Basic metabolic  panel  Tomorrow morning,   R       Question:  Specimen collection method  Answer:  IV Team=IV Team collect   01/09/22 1409   01/10/22 0500  CBC  Tomorrow morning,   R        01/09/22 1409   01/08/22 1316  Blood Culture (routine x 2)  (Septic presentation on arrival (screening labs, nursing and treatment orders for obvious sepsis))  BLOOD CULTURE X 2,   STAT     Question:  Patient immune status  Answer:  Normal   01/08/22 1316  Vitals/Pain Today's Vitals   01/09/22 1000 01/09/22 1045 01/09/22 1230 01/09/22 1330  BP: 105/71 114/73 107/67 124/75  Pulse: 95 91 97 (!) 103  Resp: (!) 25 (!) 22 (!) 26 (!) 26  Temp:      TempSrc:      SpO2: 94% 94% 94% 94%  Weight:      Height:      PainSc:        Isolation Precautions Droplet precaution  Medications Medications  aspirin chewable tablet 81 mg (81 mg Oral Given 01/09/22 1000)  oxyCODONE (Oxy IR/ROXICODONE) immediate release tablet 15 mg (15 mg Oral Given 01/09/22 0438)  letrozole [facility]) tablet 2.5 mg (2.5 mg Oral Given 01/09/22 1002)  atorvastatin (LIPITOR) tablet 80 mg (80 mg Oral Given 01/09/22 1000)  mirabegron ER (MYRBETRIQ) tablet 50 mg (50 mg Oral Given 01/09/22 1002)  rivaroxaban (XARELTO) tablet 20 mg (20 mg Oral Given 01/09/22 0828)  gabapentin (NEURONTIN) capsule 400 mg (400 mg Oral Given 01/08/22 2206)  fluticasone furoate-vilanterol (BREO ELLIPTA) 100-25 MCG/ACT 1 puff (1 puff Inhalation Given 01/09/22 1007)    And  umeclidinium bromide (INCRUSE ELLIPTA) 62.5 MCG/ACT 1 puff (1 puff Inhalation Given 01/09/22 1006)  acetaminophen (TYLENOL) tablet 650 mg (has no administration in time range)    Or  acetaminophen (TYLENOL) suppository 650 mg (has no administration in time range)  nicotine (NICODERM CQ - dosed in mg/24 hours) patch 14 mg (14 mg Transdermal Patch Applied 01/09/22 1005)  lactated ringers infusion ( Intravenous Not Given 01/09/22 0511)  ipratropium-albuterol (DUONEB) 0.5-2.5 (3) MG/3ML nebulizer solution  3 mL (3 mLs Nebulization Given 01/09/22 0430)  fluticasone (FLONASE) 50 MCG/ACT nasal spray 1 spray (0 sprays Each Nare Hold 01/09/22 1021)  guaiFENesin-dextromethorphan (ROBITUSSIN DM) 100-10 MG/5ML syrup 5 mL (has no administration in time range)  potassium chloride SA (KLOR-CON M) CR tablet 40 mEq (40 mEq Oral Given 01/09/22 1343)  predniSONE (DELTASONE) tablet 40 mg (has no administration in time range)  acetaminophen (TYLENOL) tablet 1,000 mg (1,000 mg Oral Given 01/08/22 1344)  albuterol (PROVENTIL) (2.5 MG/3ML) 0.083% nebulizer solution 5 mg (5 mg Nebulization Given 01/08/22 1344)  ipratropium (ATROVENT) nebulizer solution 0.5 mg (0.5 mg Nebulization Given 01/08/22 1344)  oxyCODONE-acetaminophen (PERCOCET/ROXICET) 5-325 MG per tablet 1 tablet (1 tablet Oral Given 01/08/22 1654)  albuterol (PROVENTIL) (2.5 MG/3ML) 0.083% nebulizer solution 5 mg (5 mg Nebulization Given 01/08/22 1654)  ipratropium (ATROVENT) nebulizer solution 0.5 mg (0.5 mg Nebulization Given 01/08/22 1654)  potassium chloride SA (KLOR-CON M) CR tablet 40 mEq (40 mEq Oral Given 01/09/22 0125)  magnesium sulfate IVPB 2 g 50 mL (0 g Intravenous Stopped 01/09/22 1004)    Mobility walks with device Moderate fall risk   Focused Assessments Pulmonary Assessment Handoff:  Lung sounds: Bilateral Breath Sounds: Diminished, Inspiratory wheezes, Expiratory wheezes L Breath Sounds: Diminished, Expiratory wheezes, Inspiratory wheezes R Breath Sounds: Diminished, Expiratory wheezes, Inspiratory wheezes O2 Device: Nasal Cannula O2 Flow Rate (L/min): 3 L/min    R Recommendations: See Admitting Provider Note

## 2022-01-09 NOTE — ED Notes (Signed)
Patient bed linens changed and new sheet and gown provided. Transport moved patient upstairs to inpatient bed.

## 2022-01-09 NOTE — ED Notes (Signed)
Verbal report given to Anna M EMT-P at this time 

## 2022-01-09 NOTE — Plan of Care (Signed)
  Problem: Education: Goal: Knowledge of General Education information will improve Description: Including pain rating scale, medication(s)/side effects and non-pharmacologic comfort measures 01/09/2022 2123 by Lonny Prude, RN Outcome: Progressing 01/09/2022 2123 by Lonny Prude, RN Outcome: Progressing   Problem: Health Behavior/Discharge Planning: Goal: Ability to manage health-related needs will improve 01/09/2022 2123 by Lonny Prude, RN Outcome: Progressing 01/09/2022 2123 by Lonny Prude, RN Outcome: Progressing   Problem: Clinical Measurements: Goal: Ability to maintain clinical measurements within normal limits will improve 01/09/2022 2123 by Lonny Prude, RN Outcome: Progressing 01/09/2022 2123 by Lonny Prude, RN Outcome: Progressing Goal: Will remain free from infection 01/09/2022 2123 by Lonny Prude, RN Outcome: Progressing 01/09/2022 2123 by Lonny Prude, RN Outcome: Progressing Goal: Diagnostic test results will improve 01/09/2022 2123 by Lonny Prude, RN Outcome: Progressing 01/09/2022 2123 by Lonny Prude, RN Outcome: Progressing Goal: Respiratory complications will improve 01/09/2022 2123 by Lonny Prude, RN Outcome: Progressing 01/09/2022 2123 by Lonny Prude, RN Outcome: Progressing Goal: Cardiovascular complication will be avoided 01/09/2022 2123 by Lonny Prude, RN Outcome: Progressing 01/09/2022 2123 by Lonny Prude, RN Outcome: Progressing   Problem: Activity: Goal: Risk for activity intolerance will decrease 01/09/2022 2123 by Lonny Prude, RN Outcome: Progressing 01/09/2022 2123 by Lonny Prude, RN Outcome: Progressing   Problem: Nutrition: Goal: Adequate nutrition will be maintained Outcome: Progressing   Problem: Coping: Goal: Level of anxiety will decrease Outcome: Progressing   Problem: Elimination: Goal: Will not experience complications related to bowel motility Outcome: Progressing Goal: Will not experience complications  related to urinary retention Outcome: Progressing   Problem: Pain Managment: Goal: General experience of comfort will improve Outcome: Progressing   Problem: Safety: Goal: Ability to remain free from injury will improve Outcome: Progressing   Problem: Skin Integrity: Goal: Risk for impaired skin integrity will decrease Outcome: Progressing

## 2022-01-09 NOTE — Progress Notes (Signed)
Subjective:  Patient reports feeling sick and congested this morning, slightly better than yesterday. Denies diarrhea and has been tolerating oral foods-liquids well. Her lower extremity swelling has improved over the last few days. Explained positive rhinovirus test and plans for discharge tomorrow. She agreed with this plan.   Objective: Vitals over previous 24hr: Vitals:   01/09/22 0745 01/09/22 1000 01/09/22 1045 01/09/22 1230  BP: 108/69 105/71 114/73 107/67  Pulse: 93 95 91 97  Resp: (!) 21 (!) 25 (!) 22 (!) 26  Temp:      TempSrc:      SpO2: 98% 94% 94% 94%  Weight:      Height:        General:      awake and alert, sitting uncomfortably in bed, cooperative, not in acute distress Lungs:      normal respiratory effort, labored breathing upon standing with two-person assist, symmetrical chest rise, expiratory wheezing bilaterally Cardiac:      tachycardic with regular rhythm, normal S1 and S2, trace pitting edema Abdomen:      soft and non-distended, no tenderness to palpation or guarding Neurologic:      oriented to person-place-time, no gross focal deficits Psychiatric:      mood and affect normal, intelligible speech    Assessment/Plan: Ms Deshotels is a 71 year old female with a past medical history of HFpEF, COPD, AFib, diabetes, hypertension, hyperlipidemia, and morbid obesity who presents with breath shortness and productive cough, now admitted for suspected COPD exacerbation in setting of rhinovirus infection.    ---Acute upper respiratory rhinovirus infection Patient developed a productive cough with associated sinus pain and fever after recent hospitalization for an acute heart failure exacerbation. Imaging negative for focal consolidation and respiratory panel positive for rhinovirus. Antibiotics were initiated, but discontinued after viral etiology was confirmed. Procalcitonin and S.pneumoniae urinary antigen both negative. Short course of prednisone was also  started given likely COPD exacerbation. Physical therapy recommending discharge to SNF given difficulty ambulating at baseline, however, patient is adamant about returning home. > Fluticasone 50ug q24 > Guaifenesin-dextromorphan 100-'10mg'$  q4 PRN > Trend CBC > Follow sputum and blood cultures, no growth at 1d   ---Chronic obstructive pulmonary disease History of COPD managed at home with albuterol, ipratropium, Trelegy, and oxygen therapy at home. Expiratory wheezing present on exam and patient is reporting productive cough. Rhinovirus infection possibly causing an acute COPD exacerbation and contributing to her symptoms. > Fluticasone-vilanterol 100-25ug q24 > Umeclidinium bromide 62.5ug q24 > Ipratropium-albuterol 0.5-2.'5mg'$  q4 PRN > Prednisone '40mg'$  q24, last dose 10-13   ---Acute kidney injury ---Hypokalemia Creatinine levels were elevated to 2.03 above baseline of about 0.9-1.0. Renal ultrasound negative for hydronephrosis, but identified a 5.2cm cyst on upper midportion of her right kidney. Etiology of AKI likely prerenal given improvement with intravenous fluids, creatinine now 1.3. Laboratory results also revealed K of 2.9. Further imaging with CT in the outpatient setting is warranted to better visualize her renal cyst. > Encourage oral hydration > Replete K as needed > Trend BMP   ---Heart failure with preserved ejection fraction ---Atrial fibrillation Heart failure is currently stable, recently hospitalized for acute exacerbation that responded well to aggressive diuresis. Atrial fibrillation is stable. Takes metoprolol and rivaroxaban at home. > Rivaroxaban '200mg'$  q24 > Hold metoprolol given low BP values   ---Hypertension Patient has a history of hypertension managed at home with isosorbide-mononitrate, losartan, and metoprolol. Blood pressure has been low throughout admission, treatment team is currently holding her home medications. > Hold  isosorbide-mononitrate, losartan,  and metoprolol   ---Diabetes II Patient has a history of diabetes managed at home with semaglutide. Most recent A1C 5.8 on 9-1. Blood glucose values have been stable and ony slightly elevated throughout hospitalization. > Monitor CBG    Principal Problem:   Sepsis ([place]) Active Problems:   COPD (chronic obstructive pulmonary disease) (HCC)   (HFpEF) heart failure with preserved ejection fraction (HCC)   Atrial fibrillation (HCC)   Acute upper respiratory infection   Acute kidney injury ([place])   Diabetes ([place])    Prior to Admission Living Arrangement: home Anticipated Discharge Location: home, patient declined SNF Barriers to Discharge: symptomatic improvement Dispo: Anticipated discharge in approximately 1 day(s).    Roswell Nickel, MD Internal Medicine PGY-1 Pager [phone]  After 5pm on weekdays and 1pm on weekends: On Call pager [phone]

## 2022-01-09 NOTE — Progress Notes (Signed)
Internal Medicine Attending:   I saw and examined the patient. I reviewed the resident's H and P  note and I agree with the resident's findings and plan as documented in the resident's note.  In brief, patient is a 71 year old female with a past medical history of HFpEF, COPD, A-fib, diabetes, hyperlipidemia, hypertension, morbid obesity who presents to the ED with worsening shortness of breath, productive cough over the last 3 days.  Patient was recently admitted to the hospital for acute on chronic HFpEF exacerbation and was aggressively diuresed at that time and was discharged home on oral torsemide.  Patient states that soon after being discharged she developed productive cough with worsening shortness of breath and dyspnea on exertion as well as associated fatigue and decreased appetite.  Patient states that cough is productive of yellowish phlegm.  Patient also reported an associated headache which was pressure-like.  Patient has had some decreased appetite since the beginning of his symptoms and diarrhea at home.  Patient came to the ED for further evaluation given her worsening symptoms.  In the ED, she is noted to be febrile, tachycardic and borderline hypotensive.  She was noted to have an AKI with a creatinine up to 2 from a baseline of approximately 1.  Today, patient states that she still has persistent fatigue with dyspnea on exertion.    Vitals:   01/09/22 1045 01/09/22 1230  BP: 114/73 107/67  Pulse: 91 97  Resp: (!) 22 (!) 26  Temp:    SpO2: 94% 94%   On exam, patient was lying in bed in no apparent distress.  She was noted to be tachycardic on exam with normal heart sounds.  Her lungs revealed bilateral expiratory wheezes.  Abdomen was soft, nontender, nondistended with normoactive bowel sounds.  Lower extremities show trace edema with chronic venous stasis changes.  Lower extremities were mildly tender to palpation bilaterally but no increased warmth or signs of infection.   Patient has normal and affect.  In brief, patient was admitted to the hospital with worsening shortness of breath and fatigue with a productive cough and likely has an acute COPD exacerbation in the setting of a rhinovirus/enterovirus infection.  Patient symptoms have continued to improve but still with intermittent fatigue and shortness of breath with exertion.  We will start the patient on prednisone 40 mg daily to complete a 5-day course for her acute COPD exacerbation.  We will continue with DuoNebs as needed for now as well as home LAMA/LABA/ICS.  Patient with no signs of underlying bacterial infection.  Chest x-ray with no evidence of infiltrate.  Procalcitonin was within normal limits and respiratory viral panel revealed positive rhinovirus/enterovirus.  Will DC antibiotics for now.  We will complete a 3-day course of azithromycin for her COPD exacerbation.  Continue guaifenesin for symptom management.  Patient was also noted to have an AKI likely secondary to dehydration given diarrhea and poor oral intake over the last 4 days.  Patient states that diarrhea has since resolved.  Patient did receive 300 cc of fluid in the ED as well as is tolerating oral intake.  Patient's creatinine has improved to 1.3 today.  We will continue to monitor closely.  No further IV fluids required at this time.  We will ultrasound did show a 5 cm cyst. We will obtain a CT of her abdomen/pelvis for further clarification.  This can be done as an outpatient as well.  Patient will likely be stable for DC home tomorrow once her symptoms  have further improved.  Given her persistent fatigue and shortness of breath with exertion as well as tachycardia in the 140s on minimal exertion I would prefer to observe her for an additional day rather than DC today.

## 2022-01-09 NOTE — Plan of Care (Signed)

## 2022-01-09 NOTE — Discharge Summary (Incomplete)
Name: Sharon Vasquez [name]: [id number] DOB: [birth year] 71 y.o. PCP: [name], DO  Date of Admission: 01/08/2022  1:04 PM Date of Discharge: 01/10/2022 Attending Physician: Aldine Contes, MD  Discharge Diagnosis: Principal Problem:   Sepsis [facility]) Active Problems:   COPD (chronic obstructive pulmonary disease) ([place])   (HFpEF) heart failure with preserved ejection fraction (HCC)   Atrial fibrillation ([place])   Acute upper respiratory infection   Acute kidney injury ([place])   Diabetes ([place])     Discharge Medications: Allergies as of 01/09/2022   No Known Allergies   Med Rec must be completed prior to using this [facility]***        Disposition and follow-up:    Sharon Vasquez was discharged from [facility] in Stable condition.  At the hospital follow up visit please address:   1.  Ensure patient's respiratory symptoms have improved and that she finished her four-day course of prednisone, please also order abdominal-pelvic CT scan to follow up 5.3cm R kidney cyst  2.  Labs / imaging needed at time of follow-up: abdominal-pelvic CT  3.  Pending labs/ test needing follow-up: none    Follow-up Appointments:  Internal Medicine Clinic should call you with a follow-up visit in [facility] Course by problem list:  Acute upper respiratory rhinovirus infection Patient developed a productive cough with associated sinus pain and fever after recent hospitalization for an acute heart failure exacerbation. Imaging was negative for focal consolidation and respiratory panel positive for rhinovirus. Antibiotics were initiated, but discontinued after viral etiology was confirmed. Short course of prednisone was also started. Physical therapy recommending discharge to SNF given difficulty ambulating at baseline. The patient was adamant about returning home, home health services have been arranged.     Acute kidney injury Creatinine levels were elevated  to 2.03 above baseline of about 0.9-1.0. Renal ultrasound negative for hydronephrosis, but identified a 5.2cm cyst on upper midportion of her right kidney. Etiology of AKI likely prerenal given improvement with intravenous fluids. On day of discharge, creatinine had improved to 1.3. Further imaging with CT in the outpatient setting is warranted to better visualize her renal cyst.    Discharge Exam:    BP 120/75   Pulse 94   Temp 98.6 F (37 C) (Oral)   Resp 20   Ht '6\' 2"'$  (1.88 m)   Wt (!) 170.9 kg   SpO2 95%   BMI 48.37 kg/m   Discharge exam:  General: alert and awake, sitting on edge of bed, not in acute distress Cardiovascular: tachycardic with regular rhythm, trace pitting edema bilaterally in lower extremities Respiratory: increased work of breathing upon standing, coarse crackles with expiratory wheezing more prominent in upper lobes Skin: warm and dry Neuro: oriented to person-place-time, no gross focal deficits Psych: pleasant, normal mood and affect   Pertinent Labs, Studies, and Procedures:   Labs:    Latest Ref Rng & Units 01/08/2022    3:01 PM 01/04/2022    3:14 AM 11/22/2021    2:11 PM  CBC  WBC 4.0 - 10.5 K/uL 11.3  5.9  4.7   Hemoglobin 12.0 - 15.0 g/dL 13.7  12.5  12.2   Hematocrit 36.0 - 46.0 % 39.1  38.2  36.3   Platelets 150 - 400 K/uL 471  371  459       Latest Ref Rng & Units 01/09/2022    9:40 AM 01/08/2022    3:01 PM 01/04/2022    4:50  PM  CMP  Glucose 70 - 99 mg/dL 193  130  144   BUN 8 - 23 mg/dL 33  45  16   Creatinine 0.44 - 1.00 mg/dL 1.31  2.03  1.28   Sodium 135 - 145 mmol/L 136  137  141   Potassium 3.5 - 5.1 mmol/L 2.8  2.9  3.6   Chloride 98 - 111 mmol/L 97  91  102   CO2 22 - 32 mmol/L '29  30  28   '$ Calcium 8.9 - 10.3 mg/dL 8.7  9.1  9.3   Total Protein 6.5 - 8.1 g/dL  9.4    Total Bilirubin 0.3 - 1.2 mg/dL  1.3    Alkaline Phos 38 - 126 U/L  91    AST 15 - 41 U/L  37    ALT 0 - 44 U/L  26     Mg 1.6 BNP  47 ______________________  Imaging:  US RENAL Result Date: 01/08/2022 IMPRESSION: There is no hydronephrosis. There is 5.2 cm cyst in the upper midportion of right kidney with thin internal septation. Follow-up multiphasic CT may be considered for further characterization.  DG Chest Port 1 View Result Date: 01/08/2022 IMPRESSION: Interval resolution of bibasilar airspace opacities. No acute cardiopulmonary process. Stable cardiac enlargement.   ______________________  Procedures:  none  ______________________   Discharge Instructions:  Sharon Vasquez,  It was a pleasure taking care of you while you were in the hospital. Your shortness of breath and difficulty breathing were caused by an upper respiratory infection with rhinovirus. We gave you some albuterol to help you breath and fluids to help your body fight the infection. Before you left, we had physical therapy work with you to ensure that you could walk without experiencing breath shortness.  We did not give you any medications to take specifically for your infection because there is no treatment for rhinovirus. Please sleep well and drink plenty of water throughout the next several days. If your shortness of breath returns or your symptoms worsen, then please visit the emergency department.   Signed: Roswell Nickel, MD Internal Medicine PGY-1 Pager [phone]

## 2022-01-09 NOTE — Evaluation (Signed)
Physical Therapy Evaluation Patient Details Name: Sharon Vasquez [name]: [id number] DOB: Dec 03, 1950 Today's Date: 01/09/2022  History of Present Illness  71 year old female admitted 10/8   who presents with 3 days of cough productive of yellow sputum, shortness of breath, decreased appetite, and fatigue.  Positive for rhinovirus per MD.  PMH: CHF, COPD  Clinical Impression  Pt admitted with above diagnosis. Pt was able to stand at EOB with +2 support and this is her baseline is stand pivot transfers per pt. Pt  states she hasnt walked in years therefore could not perform ambulatory sat note per MD request. Pt desaturate to 91% standing at EOB with 2.5LO2 in place. Pt tolerates standing with UE support today.  Pt would benefit from SNF to gain strength and endurance however if pt refuses, recommend HHPT.  Will follow acutely. Pt currently with functional limitations due to the deficits listed below (see PT Problem List). Pt will benefit from skilled PT to increase their independence and safety with mobility to allow discharge to the venue listed below.          Recommendations for follow up therapy are one component of a multi-disciplinary discharge planning process, led by the attending physician.  Recommendations may be updated based on patient status, additional functional criteria and insurance authorization.  Follow Up Recommendations Skilled nursing-short term rehab (<3 hours/day) (Would benefit as pt has had steady decline for some time now however pt may refuse and if she does, please order HHPT) Can patient physically be transported by private vehicle: Yes    Assistance Recommended at Discharge Intermittent Supervision/Assistance  Patient can return home with the following  A lot of help with walking and/or transfers;A lot of help with bathing/dressing/bathroom;Assistance with cooking/housework;Assist for transportation;Help with stairs or ramp for entrance    Equipment Recommendations None  recommended by PT  Recommendations for Other Services       Functional Status Assessment Patient has had a recent decline in their functional status and demonstrates the ability to make significant improvements in function in a reasonable and predictable amount of time.     Precautions / Restrictions Precautions Precautions: Fall Precaution Comments: Droplet Restrictions Weight Bearing Restrictions: No      Mobility  Bed Mobility Overal bed mobility: Needs Assistance Bed Mobility: Supine to Sit     Supine to sit: Supervision     General bed mobility comments: incr time to come to eOB    Transfers Overall transfer level: Needs assistance Equipment used: 2 person hand held assist Transfers: Sit to/from Stand Sit to Stand: Min assist           General transfer comment: Pt needed bil UE support to stand at edge of stretcher.  Pt states all she does is stand and pivot at home. MD made aware that pt hasnt walked in some time per pt.    Ambulation/Gait               General Gait Details: Hasnt walked per pt in a year.  Stairs            Wheelchair Mobility    Modified Rankin (Stroke Patients Only)       Balance Overall balance assessment: Needs assistance Sitting-balance support: No upper extremity supported, Feet supported Sitting balance-Leahy Scale: Fair     Standing balance support: Bilateral upper extremity supported, During functional activity Standing balance-Leahy Scale: Poor Standing balance comment: relieant on UE support  Pertinent Vitals/Pain Pain Assessment Pain Assessment: No/denies pain    Home Living Family/patient expects to be discharged to:: Private residence Living Arrangements: Alone Available Help at Discharge: Friend(s);Available PRN/intermittently;Family Type of Home: Apartment Home Access: Elevator       Home Layout: One level Home Equipment: Wheelchair - Education officer, community  - power;BSC/3in1;Rollator (4 wheels);Other (comment) (2LO2 at home and sometimes 3 per pt)      Prior Function               Mobility Comments: used rollator when felt well and when didnt, used wheelchair which pt states she has been using wheelchair for about a year and hasnt walked in a year. ADLs Comments: Sponge bathes     Hand Dominance   Dominant Hand: Right    Extremity/Trunk Assessment   Upper Extremity Assessment Upper Extremity Assessment: Defer to OT evaluation    Lower Extremity Assessment Lower Extremity Assessment: RLE deficits/detail;LLE deficits/detail RLE Deficits / Details: grossly 3-/5 LLE Deficits / Details: grossly 3-/5    Cervical / Trunk Assessment Cervical / Trunk Assessment: Kyphotic  Communication   Communication: No difficulties  Cognition Arousal/Alertness: Awake/alert Behavior During Therapy: WFL for tasks assessed/performed Overall Cognitive Status: Within Functional Limits for tasks assessed                                          General Comments General comments (skin integrity, edema, etc.): 92 bpm, 95% 2.5L, 98/55; desat to 91% on 2.5L with standing.  HR to 144 bpm with standing.    Exercises     Assessment/Plan    PT Assessment Patient needs continued PT services  PT Problem List Decreased activity tolerance;Decreased balance;Decreased mobility;Decreased knowledge of use of DME;Decreased safety awareness;Decreased knowledge of precautions;Cardiopulmonary status limiting activity       PT Treatment Interventions DME instruction;Gait training;Functional mobility training;Therapeutic activities;Therapeutic exercise;Balance training;Patient/family education    PT Goals (Current goals can be found in the Care Plan section)  Acute Rehab PT Goals Patient Stated Goal: to go home PT Goal Formulation: With patient Time For Goal Achievement: 01/23/22 Potential to Achieve Goals: Good    Frequency Min 3X/week      Co-evaluation               AM-PAC PT "6 Clicks" Mobility  Outcome Measure Help needed turning from your back to your side while in a flat bed without using bedrails?: None Help needed moving from lying on your back to sitting on the side of a flat bed without using bedrails?: None Help needed moving to and from a bed to a chair (including a wheelchair)?: A Lot Help needed standing up from a chair using your arms (e.g., wheelchair or bedside chair)?: Total Help needed to walk in hospital room?: Total Help needed climbing 3-5 steps with a railing? : Total 6 Click Score: 13    End of Session Equipment Utilized During Treatment: Gait belt;Oxygen Activity Tolerance: Patient limited by fatigue Patient left: with call bell/phone within reach (on stretcher) Nurse Communication: Mobility status PT Visit Diagnosis: Muscle weakness (generalized) (M62.81)    Time: 7062-3762 PT Time Calculation (min) (ACUTE ONLY): 25 min   Charges:   PT Evaluation $PT Eval Moderate Complexity: 1 Mod PT Treatments $Therapeutic Activity: 8-22 mins        [facility] M,PT Acute Rehab Services [phone]   Alvira Philips 01/09/2022, 9:42 AM

## 2022-01-10 ENCOUNTER — Other Ambulatory Visit (HOSPITAL_COMMUNITY): Payer: Self-pay

## 2022-01-10 LAB — EXPECTORATED SPUTUM ASSESSMENT W GRAM STAIN, RFLX TO RESP C

## 2022-01-10 LAB — BASIC METABOLIC PANEL
Anion gap: 10 (ref 5–15)
BUN: 26 mg/dL — ABNORMAL HIGH (ref 8–23)
CO2: 29 mmol/L (ref 22–32)
Calcium: 9 mg/dL (ref 8.9–10.3)
Chloride: 100 mmol/L (ref 98–111)
Creatinine, Ser: 1.04 mg/dL — ABNORMAL HIGH (ref 0.44–1.00)
GFR, Estimated: 57 mL/min — ABNORMAL LOW (ref >60)
Glucose, Bld: 132 mg/dL — ABNORMAL HIGH (ref 70–99)
Potassium: 3.5 mmol/L (ref 3.5–5.1)
Sodium: 139 mmol/L (ref 135–145)

## 2022-01-10 LAB — CBC
HCT: 38.3 % (ref 36.0–46.0)
Hemoglobin: 12.7 g/dL (ref 12.0–15.0)
MCH: 28.1 pg (ref 26.0–34.0)
MCHC: 33.2 g/dL (ref 30.0–36.0)
MCV: 84.7 fL (ref 80.0–100.0)
Platelets: 451 10*3/uL — ABNORMAL HIGH (ref 150–400)
RBC: 4.52 MIL/uL (ref 3.87–5.11)
RDW: 13.7 % (ref 11.5–15.5)
WBC: 8.9 10*3/uL (ref 4.0–10.5)
nRBC: 0 % (ref 0.0–0.2)

## 2022-01-10 MED ORDER — PREDNISONE 20 MG PO TABS
40.0000 mg | ORAL_TABLET | Freq: Every day | ORAL | 0 refills | Status: AC
  Filled 2022-01-10: qty 6, 3d supply, fill #0

## 2022-01-10 MED ORDER — TORSEMIDE 20 MG PO TABS: 40.0000 mg | ORAL_TABLET | Freq: Every day | ORAL | Status: DC

## 2022-01-10 MED ORDER — GUAIFENESIN ER 600 MG PO TB12
600.0000 mg | ORAL_TABLET | Freq: Two times a day (BID) | ORAL | 0 refills | Status: AC | PRN
  Filled 2022-01-10: qty 14, 7d supply, fill #0

## 2022-01-10 MED ORDER — METOPROLOL SUCCINATE ER 25 MG PO TB24
25.0000 mg | ORAL_TABLET | Freq: Every day | ORAL | Status: DC
  Administered 2022-01-10: 25 mg via ORAL
  Filled 2022-01-10: qty 1

## 2022-01-10 NOTE — Discharge Instructions (Signed)
Sharon Vasquez,   It was a pleasure taking care of you while you were in the hospital. Your shortness of breath and difficulty breathing were caused by an upper respiratory infection with rhinovirus, which is a common cold virus. We gave breathing treatments and a short course of prednisone to help control your symptoms.  You do not need antibiotics for a viral infection.  Your symptoms will improve in the next few days.  Please try to hydrate, eat nutritious food and get plenty of rest.  Please continue 3 more days of Prednisone.  We also prescribed Mucinex as needed for congestion.  You were also very dehydrated with diarrhea and poor oral intake.  Your blood pressure and lab reports improved significantly but IV hydration.  Please continue to drink plenty of fluid the next few days.  Please resume your Torsemide, Metoprolol and Potassium tablet when you go home. Please hold Losartan and Imdur due your low blood pressure. We will schedule a follow-up appointment in the Internal Medicine Clinic in 1 week for repeat blood work.  We will resume your blood pressure medications at follow up visit if indicated.  Take care

## 2022-01-10 NOTE — TOC Initial Note (Signed)
Transition of Care [facility]) - Initial/Assessment Note    Patient Details  Name: Sharon Vasquez [name]: [id number] Date of Birth: 01/08/1951  Transition of Care [facility]) CM/SW Contact:    Benard Halsted, LCSW Phone Number: 01/10/2022, 2:01 PM  Clinical Narrative:                 CSW received consult for possible SNF at time of discharge. CSW spoke with patient. Patient reported that she does not want to go to SNF and she would like to return home at discharge. RNCM assisting with arranging home health services through [facility]. She stated she is on a waitlist for an aide to come into the home to help with cooking. She is re-certifying for food stamps and is going to complete a Medicaid application because she believes she only has partial Medicaid. CSW discussed equipment needs and she has a motorized wheelchair, CPAP, and oxygen at home. CSW confirmed PCP and address with patient. Patient states her daughter will pick her up after work today. No further questions reported at this time and patient reported she is looking forward to going on a cruise in December.    Expected Discharge Plan: [place] Barriers to Discharge: Barriers Resolved   Patient Goals and CMS Choice Patient states their goals for this hospitalization and ongoing recovery are:: Return home CMS Medicare.gov Compare Post Acute Care list provided to:: Patient Choice offered to / list presented to : Patient  Expected Discharge Plan and Services Expected Discharge Plan: [place] In-house Referral: Clinical Social Work Discharge Planning Services: CM Consult Post Acute Care Choice: [place] arrangements for the past 2 months: Apartment Expected Discharge Date: 01/10/22                                    Prior Living Arrangements/Services Living arrangements for the past 2 months: Apartment Lives with:: Self Patient language and need for interpreter reviewed:: Yes Do you  feel safe going back to the place where you live?: Yes      Need for Family Participation in Patient Care: No (Comment) Care giver support system in place?: Yes (comment) Current home services: DME (wheelchair/ purwick/electric wc) Criminal Activity/Legal Involvement Pertinent to Current Situation/Hospitalization: No - Comment as needed  Activities of Daily Living      Permission Sought/Granted Permission sought to share information with : Facility Art therapist granted to share information with : Yes, Verbal Permission Granted     Permission granted to share info w AGENCY: HH        Emotional Assessment Appearance:: Appears stated age Attitude/Demeanor/Rapport: Engaged Affect (typically observed): Accepting, Appropriate Orientation: : Oriented to Self, Oriented to Place, Oriented to  Time, Oriented to Situation Alcohol / Substance Use: Not Applicable Psych Involvement: No (comment)  Admission diagnosis:  AKI (acute kidney injury) ([place]) [N17.9] Sepsis ([place]) [A41.9] Community acquired pneumonia, unspecified laterality [J18.9] Sepsis with acute renal failure without septic shock, due to unspecified organism, unspecified acute renal failure type ([place]) [A41.9, R65.20, N17.9] Patient Active Problem List   Diagnosis Date Noted   Sepsis ([place]) 01/08/2022   Acute upper respiratory infection 01/08/2022   AKI (acute kidney injury) ([place]) 01/08/2022   Diabetes ([place]) 01/08/2022   Acute on chronic hypoxic respiratory failure ([place]) 01/04/2022   Stroke ([place]) 12/02/2021   Prediabetes 04/09/2021   Hypokalemia 04/09/2021   Genetic testing 03/23/2021  Malignant neoplasm of upper-outer quadrant of right breast in female, estrogen receptor positive ([place]) 03/21/2021   Pre-operative respiratory examination 03/16/2021   Right foot pain 03/03/2021   Breast pain, right 01/17/2021   (HFpEF) heart failure with preserved ejection fraction [facility]    Atrial fibrillation ([place])     Osteoarthritis of left hip 10/07/2020   Venous stasis ulcer of left lower extremity ([place]) 08/18/2020   Hyperlipidemia 05/14/2020   Esophageal dysmotility 11/10/2019   Chronic respiratory failure with hypoxia ([place]) 09/30/2019   Mixed incontinence, urge and stress (female) (female) 08/21/2019   Preventative health care 06/04/2019   GERD (gastroesophageal reflux disease) 01/20/2019   ILD (interstitial lung disease) ([place]) 02/13/2018   OSA (obstructive sleep apnea) 11/14/2017   Chronic pain syndrome 07/27/2017   Major depression, recurrent, chronic ([place]) 02/01/2017   Osteoporosis 12/14/2016   Drug induced constipation 08/21/2016   Aortic atherosclerosis ([place]) 07/17/2016   Chronic venous insufficiency 07/12/2016   Chronic anticoagulation 07/12/2016   Tobacco use 07/11/2016   COPD (chronic obstructive pulmonary disease) (HCC)    Diastolic heart failure ([place])    History of pulmonary embolism    Atrial fibrillation with controlled ventricular response ([place])    Vocal cord polyp 12/18/2015   Laryngopharyngeal reflux 12/18/2015   Morbid obesity ([place])    Peripheral vascular disease ([place])    Benign essential HTN    Primary osteoarthritis of both knees 09/16/2014   PCP:  [name], DO Pharmacy:   [id number] [address] [place] [address] Phone: [phone] Fax: [phone]  [facility] ([place], [place] [facility] [address] [place] [address] Phone: [phone] Fax: [place], [place] [address] Phone: [phone] Fax: [phone]  [name] [address] [place] [address] Phone: [phone] Fax: [phone]     Social Determinants of Health (SDOH)  Interventions    Readmission Risk Interventions     No data to display

## 2022-01-11 ENCOUNTER — Telehealth: Payer: Self-pay

## 2022-01-11 LAB — CULTURE, RESPIRATORY W GRAM STAIN: Culture: NORMAL

## 2022-01-11 NOTE — Patient Outreach (Signed)
  Care Coordination TOC Note Transition Care Management Follow-up Telephone Call Date of discharge and from where: [name] 01/08/22-01/10/22 How have you been since you were released from the hospital? "I am doing okay, feeling better than before I went into the hospital" Any questions or concerns? Yes- Patient was sent home with 2 inhalers- Breoellipta and Incluse Ellipta and they are not on my discharge sheet.  This RNCM to consult with provider and get back to patient.  Items Reviewed: Did the pt receive and understand the discharge instructions provided? Yes  Medications obtained and verified? Yes  Other? No  Any new allergies since your discharge? No  Dietary orders reviewed? Yes Do you have support at home? Yes   Home Care and Equipment/Supplies: Were home health services ordered? yes If so, what is the name of the agency? [facility]  Has the agency set up a time to come to the patient's home? no Were any new equipment or medical supplies ordered?  No What is the name of the medical supply agency? N/A Were you able to get the supplies/equipment? not applicable Do you have any questions related to the use of the equipment or supplies? No  Functional Questionnaire: (I = Independent and D = Dependent) ADLs: I  Bathing/Dressing- I  Meal Prep- I  Eating- I  Maintaining continence- I  Transferring/Ambulation- I  Managing Meds- I  Follow up appointments reviewed:  PCP Hospital f/u appt confirmed? No   Specialist Hospital f/u appt confirmed? No   Are transportation arrangements needed? No  If their condition worsens, is the pt aware to call PCP or go to the Emergency Dept.? Yes Was the patient provided with contact information for the PCP's office or ED? Yes Was to pt encouraged to call back with questions or concerns? Yes  SDOH assessments and interventions completed:   Yes  Care Coordination Interventions Activated:  Yes   Care Coordination Interventions:   Collaborate with clinic staff about inhalers and call patient back, notified front desk staff patient needs follow up    Encounter Outcome:  Pt. Visit Completed

## 2022-01-11 NOTE — Patient Outreach (Signed)
  Care Coordination   Follow Up Visit Note   01/11/2022 Name: Sharon Vasquez [name]: [id number] DOB: July 19, 1950  Sharon Vasquez is a 71 y.o. year old female who sees Atway, Rayann N, DO for primary care. I spoke with  Sharon Vasquez by phone today.  What matters to the patients health and wellness today?  Collaborated with charge nurse, Howell Rucks, RN and reviewed patient medications.  Inhalers that were sent home with patient were discontinued upon discharge from the hospital and she is to take her home medications, Albuterol, Mucinex, Duoneb and Trelegy Ellipta.  Called patient back and reviewed with her which medications she should be taking.   Goals Addressed   None     SDOH assessments and interventions completed:  Yes  SDOH Interventions Today    Flowsheet Row Most Recent Value  SDOH Interventions   Food Insecurity Interventions Intervention Not Indicated        Care Coordination Interventions Activated:  Yes  Care Coordination Interventions:  Yes, provided   Follow up plan: No further intervention required.   Encounter Outcome:  Pt. Visit Completed

## 2022-01-13 LAB — CULTURE, BLOOD (ROUTINE X 2): Culture: NO GROWTH

## 2022-01-17 DIAGNOSIS — J449 Chronic obstructive pulmonary disease, unspecified: Secondary | ICD-10-CM | POA: Diagnosis not present

## 2022-01-25 ENCOUNTER — Other Ambulatory Visit: Payer: Self-pay | Admitting: Internal Medicine

## 2022-01-25 DIAGNOSIS — K5909 Other constipation: Secondary | ICD-10-CM | POA: Diagnosis not present

## 2022-01-25 DIAGNOSIS — M25562 Pain in left knee: Secondary | ICD-10-CM | POA: Diagnosis not present

## 2022-01-25 DIAGNOSIS — E1165 Type 2 diabetes mellitus with hyperglycemia: Secondary | ICD-10-CM | POA: Diagnosis not present

## 2022-01-25 DIAGNOSIS — I509 Heart failure, unspecified: Secondary | ICD-10-CM | POA: Diagnosis not present

## 2022-01-25 DIAGNOSIS — M25561 Pain in right knee: Secondary | ICD-10-CM | POA: Diagnosis not present

## 2022-01-25 DIAGNOSIS — E119 Type 2 diabetes mellitus without complications: Secondary | ICD-10-CM | POA: Diagnosis not present

## 2022-01-25 DIAGNOSIS — J439 Emphysema, unspecified: Secondary | ICD-10-CM | POA: Diagnosis not present

## 2022-01-25 DIAGNOSIS — Z79899 Other long term (current) drug therapy: Secondary | ICD-10-CM | POA: Diagnosis not present

## 2022-01-25 DIAGNOSIS — G8929 Other chronic pain: Secondary | ICD-10-CM | POA: Diagnosis not present

## 2022-01-25 DIAGNOSIS — R7309 Other abnormal glucose: Secondary | ICD-10-CM | POA: Diagnosis not present

## 2022-01-25 DIAGNOSIS — I4891 Unspecified atrial fibrillation: Secondary | ICD-10-CM | POA: Diagnosis not present

## 2022-01-26 ENCOUNTER — Encounter: Payer: Medicare Other | Admitting: Student

## 2022-01-31 DIAGNOSIS — Z79899 Other long term (current) drug therapy: Secondary | ICD-10-CM | POA: Diagnosis not present

## 2022-02-09 ENCOUNTER — Other Ambulatory Visit: Payer: Self-pay | Admitting: Internal Medicine

## 2022-02-10 DIAGNOSIS — M170 Bilateral primary osteoarthritis of knee: Secondary | ICD-10-CM | POA: Diagnosis not present

## 2022-02-10 DIAGNOSIS — G4733 Obstructive sleep apnea (adult) (pediatric): Secondary | ICD-10-CM | POA: Diagnosis not present

## 2022-02-13 ENCOUNTER — Other Ambulatory Visit: Payer: Self-pay

## 2022-02-13 DIAGNOSIS — I480 Paroxysmal atrial fibrillation: Secondary | ICD-10-CM

## 2022-02-13 DIAGNOSIS — I739 Peripheral vascular disease, unspecified: Secondary | ICD-10-CM

## 2022-02-13 MED ORDER — RIVAROXABAN 20 MG PO TABS: 20.0000 mg | ORAL_TABLET | Freq: Every morning | ORAL | 3 refills | Status: DC

## 2022-02-17 DIAGNOSIS — J449 Chronic obstructive pulmonary disease, unspecified: Secondary | ICD-10-CM | POA: Diagnosis not present

## 2022-02-20 ENCOUNTER — Inpatient Hospital Stay (HOSPITAL_COMMUNITY)
Admission: EM | Admit: 2022-02-20 | Discharge: 2022-02-22 | DRG: 291 | Disposition: A | Discharge status: Home / Self Care | Payer: Medicare Other | Attending: Infectious Diseases | Admitting: Infectious Diseases

## 2022-02-20 ENCOUNTER — Emergency Department (HOSPITAL_COMMUNITY): Payer: Medicare Other

## 2022-02-20 DIAGNOSIS — I872 Venous insufficiency (chronic) (peripheral): Secondary | ICD-10-CM | POA: Diagnosis not present

## 2022-02-20 DIAGNOSIS — Z853 Personal history of malignant neoplasm of breast: Secondary | ICD-10-CM

## 2022-02-20 DIAGNOSIS — M545 Low back pain, unspecified: Secondary | ICD-10-CM | POA: Diagnosis present

## 2022-02-20 DIAGNOSIS — Z20822 Contact with and (suspected) exposure to covid-19: Secondary | ICD-10-CM | POA: Diagnosis not present

## 2022-02-20 DIAGNOSIS — F419 Anxiety disorder, unspecified: Secondary | ICD-10-CM | POA: Diagnosis present

## 2022-02-20 DIAGNOSIS — Z79811 Long term (current) use of aromatase inhibitors: Secondary | ICD-10-CM

## 2022-02-20 DIAGNOSIS — J449 Chronic obstructive pulmonary disease, unspecified: Secondary | ICD-10-CM | POA: Diagnosis not present

## 2022-02-20 DIAGNOSIS — Z7982 Long term (current) use of aspirin: Secondary | ICD-10-CM

## 2022-02-20 DIAGNOSIS — M7989 Other specified soft tissue disorders: Secondary | ICD-10-CM | POA: Diagnosis not present

## 2022-02-20 DIAGNOSIS — Z83438 Family history of other disorder of lipoprotein metabolism and other lipidemia: Secondary | ICD-10-CM

## 2022-02-20 DIAGNOSIS — E11620 Type 2 diabetes mellitus with diabetic dermatitis: Secondary | ICD-10-CM | POA: Diagnosis not present

## 2022-02-20 DIAGNOSIS — G8929 Other chronic pain: Secondary | ICD-10-CM | POA: Diagnosis not present

## 2022-02-20 DIAGNOSIS — Z993 Dependence on wheelchair: Secondary | ICD-10-CM

## 2022-02-20 DIAGNOSIS — G4733 Obstructive sleep apnea (adult) (pediatric): Secondary | ICD-10-CM | POA: Diagnosis not present

## 2022-02-20 DIAGNOSIS — J9621 Acute and chronic respiratory failure with hypoxia: Secondary | ICD-10-CM | POA: Diagnosis not present

## 2022-02-20 DIAGNOSIS — Z803 Family history of malignant neoplasm of breast: Secondary | ICD-10-CM

## 2022-02-20 DIAGNOSIS — E785 Hyperlipidemia, unspecified: Secondary | ICD-10-CM | POA: Diagnosis present

## 2022-02-20 DIAGNOSIS — K219 Gastro-esophageal reflux disease without esophagitis: Secondary | ICD-10-CM | POA: Diagnosis not present

## 2022-02-20 DIAGNOSIS — R0602 Shortness of breath: Principal | ICD-10-CM

## 2022-02-20 DIAGNOSIS — Z86711 Personal history of pulmonary embolism: Secondary | ICD-10-CM | POA: Diagnosis not present

## 2022-02-20 DIAGNOSIS — I4891 Unspecified atrial fibrillation: Secondary | ICD-10-CM | POA: Diagnosis not present

## 2022-02-20 DIAGNOSIS — M1711 Unilateral primary osteoarthritis, right knee: Secondary | ICD-10-CM | POA: Diagnosis present

## 2022-02-20 DIAGNOSIS — F17210 Nicotine dependence, cigarettes, uncomplicated: Secondary | ICD-10-CM | POA: Diagnosis not present

## 2022-02-20 DIAGNOSIS — I110 Hypertensive heart disease with heart failure: Secondary | ICD-10-CM | POA: Diagnosis not present

## 2022-02-20 DIAGNOSIS — J9601 Acute respiratory failure with hypoxia: Secondary | ICD-10-CM | POA: Diagnosis present

## 2022-02-20 DIAGNOSIS — R079 Chest pain, unspecified: Secondary | ICD-10-CM | POA: Diagnosis not present

## 2022-02-20 DIAGNOSIS — E1121 Type 2 diabetes mellitus with diabetic nephropathy: Secondary | ICD-10-CM | POA: Diagnosis not present

## 2022-02-20 DIAGNOSIS — Z8249 Family history of ischemic heart disease and other diseases of the circulatory system: Secondary | ICD-10-CM | POA: Diagnosis not present

## 2022-02-20 DIAGNOSIS — J811 Chronic pulmonary edema: Secondary | ICD-10-CM | POA: Diagnosis not present

## 2022-02-20 DIAGNOSIS — I499 Cardiac arrhythmia, unspecified: Secondary | ICD-10-CM | POA: Diagnosis not present

## 2022-02-20 DIAGNOSIS — I5033 Acute on chronic diastolic (congestive) heart failure: Secondary | ICD-10-CM | POA: Diagnosis not present

## 2022-02-20 DIAGNOSIS — Z7901 Long term (current) use of anticoagulants: Secondary | ICD-10-CM | POA: Diagnosis not present

## 2022-02-20 DIAGNOSIS — Z794 Long term (current) use of insulin: Secondary | ICD-10-CM | POA: Diagnosis not present

## 2022-02-20 DIAGNOSIS — Z79899 Other long term (current) drug therapy: Secondary | ICD-10-CM

## 2022-02-20 DIAGNOSIS — F32A Depression, unspecified: Secondary | ICD-10-CM | POA: Diagnosis present

## 2022-02-20 DIAGNOSIS — Z9981 Dependence on supplemental oxygen: Secondary | ICD-10-CM

## 2022-02-20 DIAGNOSIS — R0789 Other chest pain: Secondary | ICD-10-CM | POA: Diagnosis not present

## 2022-02-20 DIAGNOSIS — Z743 Need for continuous supervision: Secondary | ICD-10-CM | POA: Diagnosis not present

## 2022-02-20 DIAGNOSIS — R0609 Other forms of dyspnea: Secondary | ICD-10-CM | POA: Diagnosis not present

## 2022-02-20 HISTORY — DX: Acute respiratory failure with hypoxia: J96.01

## 2022-02-20 LAB — COMPREHENSIVE METABOLIC PANEL
ALT: 19 U/L (ref 0–44)
AST: 21 U/L (ref 15–41)
Albumin: 3.7 g/dL (ref 3.5–5.0)
Alkaline Phosphatase: 89 U/L (ref 38–126)
Anion gap: 8 (ref 5–15)
BUN: 11 mg/dL (ref 8–23)
CO2: 24 mmol/L (ref 22–32)
Calcium: 9.4 mg/dL (ref 8.9–10.3)
Chloride: 111 mmol/L (ref 98–111)
Creatinine, Ser: 0.85 mg/dL (ref 0.44–1.00)
GFR, Estimated: >60 mL/min (ref >60)
Glucose, Bld: 126 mg/dL — ABNORMAL HIGH (ref 70–99)
Potassium: 3.7 mmol/L (ref 3.5–5.1)
Sodium: 143 mmol/L (ref 135–145)
Total Bilirubin: 0.6 mg/dL (ref 0.3–1.2)
Total Protein: 7.3 g/dL (ref 6.5–8.1)

## 2022-02-20 LAB — CBC WITH DIFFERENTIAL/PLATELET
Abs Immature Granulocytes: 0.01 10*3/uL (ref 0.00–0.07)
Basophils Absolute: 0 10*3/uL (ref 0.0–0.1)
Basophils Relative: 0 %
Eosinophils Absolute: 0.1 10*3/uL (ref 0.0–0.5)
Eosinophils Relative: 2 %
HCT: 39 % (ref 36.0–46.0)
Hemoglobin: 12.8 g/dL (ref 12.0–15.0)
Immature Granulocytes: 0 %
Lymphocytes Relative: 36 %
Lymphs Abs: 2.4 10*3/uL (ref 0.7–4.0)
MCH: 28.4 pg (ref 26.0–34.0)
MCHC: 32.8 g/dL (ref 30.0–36.0)
MCV: 86.7 fL (ref 80.0–100.0)
Monocytes Absolute: 0.3 10*3/uL (ref 0.1–1.0)
Monocytes Relative: 5 %
Neutro Abs: 3.8 10*3/uL (ref 1.7–7.7)
Neutrophils Relative %: 57 %
Platelets: 438 10*3/uL — ABNORMAL HIGH (ref 150–400)
RBC: 4.5 MIL/uL (ref 3.87–5.11)
RDW: 14.6 % (ref 11.5–15.5)
WBC: 6.7 10*3/uL (ref 4.0–10.5)
nRBC: 0 % (ref 0.0–0.2)

## 2022-02-20 LAB — BRAIN NATRIURETIC PEPTIDE: B Natriuretic Peptide: 366.2 pg/mL — ABNORMAL HIGH (ref 0.0–100.0)

## 2022-02-20 LAB — I-STAT VENOUS BLOOD GAS, ED
Acid-Base Excess: 0 mmol/L (ref 0.0–2.0)
Bicarbonate: 26.7 mmol/L (ref 20.0–28.0)
Calcium, Ion: 1.23 mmol/L (ref 1.15–1.40)
HCT: 38 % (ref 36.0–46.0)
Hemoglobin: 12.9 g/dL (ref 12.0–15.0)
O2 Saturation: 72 %
Potassium: 3.6 mmol/L (ref 3.5–5.1)
Sodium: 144 mmol/L (ref 135–145)
TCO2: 28 mmol/L (ref 22–32)
pCO2, Ven: 53.6 mmHg (ref 44–60)
pH, Ven: 7.305 (ref 7.25–7.43)
pO2, Ven: 42 mmHg (ref 32–45)

## 2022-02-20 LAB — RESP PANEL BY RT-PCR (FLU A&B, COVID) ARPGX2
Influenza A by PCR: NEGATIVE
Influenza B by PCR: NEGATIVE
SARS Coronavirus 2 by RT PCR: NEGATIVE

## 2022-02-20 MED ORDER — FUROSEMIDE 20 MG PO TABS
40.0000 mg | ORAL_TABLET | Freq: Once | ORAL | Status: DC
  Filled 2022-02-20: qty 2

## 2022-02-20 NOTE — ED Provider Notes (Signed)
[facility] EMERGENCY DEPARTMENT Provider Note   CSN: [id number] Arrival date & time: 02/20/22  1930     History {Add pertinent medical, surgical, social history, OB history to HPI:1} Chief Complaint  Patient presents with   Shortness of Breath    Sharon Vasquez is a 71 y.o. female.   Shortness of Breath  The patient is a 71 year old female with past medical history of COPD, HFpEF, A-fib, DM presenting by EMS for palpitations and shortness of breath.  The patient states that she was shopping earlier today when she developed palpitations.  She put a pulse ox which she carries with her in her purse on and found her heart rate to be in the 130s.  She states that she became anxious and short of breath with diffuse chest tightness.  She is also complaining of diffuse swelling in the hands and legs over the past few days.  On arrival by EMS, the patient was found to have an oxygen saturation in the 80s on room air.  She was placed on BiPAP and transferred to [facility] emergency department for further evaluation.   The patient is a 71 year old female with past medical history of COPD, HFpEF, A-fib, DM presenting by EMS for palpitations and shortness of breath.  The differential diagnosis considered includes: COPD exacerbation, CHF exacerbation, viral respiratory infection, pneumonia, sepsis, UTI, ACS, PE.  On arrival, the patient is hypertensive with a blood pressure of 199/130, on BiPAP.  The patient's physical exam was significant for significant swelling of the bilateral lower extremities.  The patient's diagnostic work-up included a blood gas which showed a pH of 7.305 with a PCO2 of 53.6.  CBC with white blood cell count of 6.7 with hemoglobin of 12.8; CMP without significant metabolic abnormality; BNP elevated to 366; COVID and flu which was negative.  The patient also received chest x-ray which showed cardiomegaly with patchy opacities throughout the left middle and lower lung  which could be infectious or inflammatory.  The patient has no reported fevers to suggest infectious causes at this time.     Home Medications Prior to Admission medications   Medication Sig Start Date End Date Taking? Authorizing Provider  albuterol (VENTOLIN HFA) 108 (90 Base) MCG/ACT inhaler Inhale 2 puffs into the lungs every 4 (four) hours as needed for shortness of breath. 08/25/21   Timothy Lasso, MD  aspirin 81 MG chewable tablet Chew 81 mg by mouth daily. 12/15/20   [provider]  atorvastatin (LIPITOR) 80 MG tablet TAKE 1 TABLET BY MOUTH EVERY DAY Patient taking differently: Take 80 mg by mouth daily. 12/02/21   Atway, Rayann N, DO  esomeprazole (NEXIUM) 40 MG capsule Take 1 capsule (40 mg total) by mouth daily as needed (for heartburn or indigestion). Patient taking differently: Take 40 mg by mouth daily. 01/20/19   Marcelyn Bruins, MD  gabapentin (NEURONTIN) 400 MG capsule TAKE 1 CAPSULE BY MOUTH  TWICE DAILY Patient taking differently: Take 400 mg by mouth at bedtime. 10/05/21   Atway, Rayann N, DO  ipratropium-albuterol (DUONEB) 0.5-2.5 (3) MG/3ML SOLN Take 3 mLs by nebulization every 6 (six) hours as needed. Patient taking differently: Take 3 mLs by nebulization every 6 (six) hours as needed (shortness of breath). 06/18/20   Martyn Ehrich, NP  letrozole ([place]) 2.5 MG tablet TAKE 1 TABLET(2.5 MG) BY MOUTH DAILY Patient taking differently: Take 2.5 mg by mouth daily. TAKE 1 TABLET(2.5 MG) BY MOUTH DAILY 11/28/21   Nicholas Lose, MD  metoprolol succinate (TOPROL-XL) 25 MG 24 hr tablet TAKE 1 TABLET BY MOUTH DAILY 02/09/22   Virl Axe, MD  MYRBETRIQ 50 MG TB24 tablet TAKE 1 TABLET BY MOUTH DAILY 01/25/22   Gaylan Gerold, DO  naloxone [facility]) nasal spray 4 mg/0.1 mL Place 1 spray into the nose once as needed. for opioid overdose Patient not taking: Reported on 01/04/2022 11/02/21   [provider]  nicotine (NICOTROL) 10 MG inhaler Inhale 1 continuous  puffing into the lungs as needed.    [provider]  oxyCODONE (ROXICODONE) 15 MG immediate release tablet Take 15 mg by mouth every 6 (six) hours as needed for pain. 08/05/20   [provider]  OXYGEN Inhale 2-3 L into the lungs daily.    [provider]  potassium chloride SA (KLOR-CON M) 20 MEQ tablet TAKE 1 TABLET BY MOUTH DAILY 12/29/21   Atway, Rayann N, DO  PRESCRIPTION MEDICATION Inhale into the lungs at bedtime. cpap    [provider]  rivaroxaban (XARELTO) 20 MG TABS tablet Take 1 tablet (20 mg total) by mouth every morning. 02/13/22   Virl Axe, MD  Semaglutide,0.25 or 0.'5MG'$ /DOS, 2 MG/3ML SOPN Inject 0.5 Units into the skin once a week. Patient taking differently: Inject 0.5 Units into the skin once a week. Wednesday 12/02/21   Masters, Joellen Jersey, DO  torsemide (DEMADEX) 20 MG tablet Take 2 tablets by mouth daily, you may take an extra 1/2 tablet only as needed for weight gain of 2 lbs overnight or 5 lbs in a week 12/02/21   Masters, Katie, DO  TRELEGY ELLIPTA 100-62.5-25 MCG/ACT AEPB INHALE 1 PUFF INTO THE LUNGS DAILY 11/16/21   Rigoberto Noel, MD      Allergies    Patient has no known allergies.    Review of Systems   Review of Systems  Respiratory:  Positive for shortness of breath.    See HPI Physical Exam Updated Vital Signs BP 135/84   Pulse 81   Temp 98.1 F (36.7 C) (Oral)   Resp (!) 23   SpO2 100%  Physical Exam Vitals and nursing note reviewed.  Constitutional:      General: She is not in acute distress.    Appearance: She is obese.     Comments: On BiPAP on arrival  HENT:     Head: Normocephalic and atraumatic.  Eyes:     Conjunctiva/sclera: Conjunctivae normal.  Cardiovascular:     Rate and Rhythm: Regular rhythm. Tachycardia present.     Heart sounds: No murmur heard. Pulmonary:     Effort: Pulmonary effort is normal. No respiratory distress.  Abdominal:     Palpations: Abdomen is soft.     Tenderness: There is no  abdominal tenderness.  Musculoskeletal:        General: No swelling.     Cervical back: Neck supple.  Skin:    General: Skin is warm and dry.     Capillary Refill: Capillary refill takes less than 2 seconds.  Neurological:     Mental Status: She is alert.  Psychiatric:        Mood and Affect: Mood normal.     ED Results / Procedures / Treatments   Labs (all labs ordered are listed, but only abnormal results are displayed) Labs Reviewed  CBC WITH DIFFERENTIAL/PLATELET - Abnormal; Notable for the following components:      Result Value   Platelets 438 (*)    All other components within normal limits  COMPREHENSIVE METABOLIC  PANEL - Abnormal; Notable for the following components:   Glucose, Bld 126 (*)    All other components within normal limits  BRAIN NATRIURETIC PEPTIDE - Abnormal; Notable for the following components:   B Natriuretic Peptide 366.2 (*)    All other components within normal limits  RESP PANEL BY RT-PCR (FLU A&B, COVID) ARPGX2  SARS CORONAVIRUS 2 BY RT PCR  BLOOD GAS, VENOUS  I-STAT VENOUS BLOOD GAS, ED    EKG None  Radiology DG Chest Portable 1 View  Result Date: 02/20/2022 CLINICAL DATA:  Shortness of breath EXAM: PORTABLE CHEST 1 VIEW COMPARISON:  Chest x-ray 01/08/2022 FINDINGS: Heart is enlarged. There central pulmonary vascular congestion. There are patchy and nodular opacities throughout the left mid and lower lung. There is no pleural effusion or pneumothorax. No acute fractures are seen. IMPRESSION: 1. Cardiomegaly with central pulmonary vascular congestion. 2. Patchy and nodular opacities throughout the left mid and lower lung, which could be infectious or inflammatory. Short-term follow-up imaging recommended to confirm resolution and to exclude other etiologies. Electronically Signed   By: Ronney Asters M.D.   On: 02/20/2022 21:11    Procedures Procedures  {Document cardiac monitor, telemetry assessment procedure when  appropriate:1}  Medications Ordered in ED Medications - No data to display  ED Course/ Medical Decision Making/ A&P                           Medical Decision Making Amount and/or Complexity of Data Reviewed Labs: ordered. Radiology: ordered.  Risk Decision regarding hospitalization.   ***  {Document critical care time when appropriate:1} {Document review of labs and clinical decision tools ie heart score, Chads2Vasc2 etc:1}  {Document your independent review of radiology images, and any outside records:1} {Document your discussion with family members, caretakers, and with consultants:1} {Document social determinants of health affecting pt's care:1} {Document your decision making why or why not admission, treatments were needed:1} Final Clinical Impression(s) / ED Diagnoses Final diagnoses:  None    Rx / DC Orders ED Discharge Orders     None

## 2022-02-20 NOTE — ED Triage Notes (Signed)
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 Pt c/o chest pressure that started 2 hours PTA. Pt went to use bathroom and when pt stood up felt short of breath and called EMS. Pt has hx of CHF and COPD. Pt c/o edema in legs and hands x3 days. Pt arrived on CPAP with EMS. Pt placed on BiPap upon arrival to ED. Pt sating 80s% on RA.   EMS Vitals: 1 nitro SL 324 ASA Duo neb 200/130 90 HR 136 CBG 24 RR 80s% RA, 94% CPAP

## 2022-02-20 NOTE — H&P (Incomplete)
Date: 02/21/2022               Patient Name:  Sharon Vasquez [name]: [id number]  DOB: 1950/11/12 Age / Sex: 71 y.o., female   PCP: Sharon Clan, DO         Medical Service: Internal Medicine Teaching Service         Attending Physician: Dr. Campbell Riches, MD    First Contact: Dr. Leigh Aurora, DO Pager: [phone]   Second Contact: Dr. Idamae Schuller, MD Pager: [phone]        After Hours (After 5p/  First Contact Pager: [phone]  weekends / holidays): Second Contact Pager: [phone]   Chief Complaint: SOB  History of Present Illness:  Sharon Vasquez is a 71 y/o female with a pmh of Afib,COPD, CHF, breast cancer, PE, OSA, HTN, T2DM who presents with SOB. The patient was at [facility] 11/20 working with [employer] and upon standing up from her wheelchair to use the restroom became dyspneic with palpitations and warmth throughout her body. She denies associated fever,chills, dizziness or light headedness,N/V, diarrhea. She did not have chest pain at the time of this dyspneic episode but did report substernal chest tightness while in the ambulance. This happens periodically and she says it was relieved by nitro in the ambulance. She says that in the past days she has had worsening swelling in her legs with use of torsemide 5m daily. Some days she endorses taking 40-634mbased on swelling, but has not done this recently. She says she cannot lay flat at home but does not have PND. She also says she adheres to low salt diet and only eats out 2-3 weekly and does not add extra salt to food. She says she has not missed any days of her BP meds.She says she has had some persistent cough with mucus production since her rhinovirus infection 01/2022 but this has been stable or slightly improving. She endorses using her treligy inhaler daily and has recently needed to use her albuterol inhaler numerous times over the last day. She has not walked in over a year and uses 2-3L of O2 at home as needed.  She has stopped smoking since October 2nd and denies alcohol or illicit substance use.   Meds:  albuterol 108 (90 Base) MCG/ACT inhaler Commonly known as: VENTOLIN HFA Inhale 2 puffs into the lungs every 4 (four) hours as needed for shortness of breath.    aspirin 81 MG chewable tablet Chew 81 mg by mouth daily.    atorvastatin 80 MG tablet Commonly known as: LIPITOR TAKE 1 TABLET BY MOUTH EVERY DAY What changed:  how much to take how to take this when to take this additional instructions    esomeprazole 40 MG capsule Commonly known as: NEXIUM Take 1 capsule (40 mg total) by mouth daily as needed (for heartburn or indigestion). What changed: when to take this    gabapentin 400 MG capsule Commonly known as: NEURONTIN TAKE 1 CAPSULE BY MOUTH  TWICE DAILY What changed: when to take this    guaiFENesin 600 MG 12 hr tablet Commonly known as: Mucinex Take 1 tablet (600 mg total) by mouth 2 (two) times daily as needed for up to 7 days.    ipratropium-albuterol 0.5-2.5 (3) MG/3ML Soln Commonly known as: DUONEB Take 3 mLs by nebulization every 6 (six) hours as needed. What changed: reasons to take this    letrozole 2.5 MG tablet Commonly known as: FELyerly TABLET(2.5  MG) BY MOUTH DAILY What changed:  how much to take how to take this when to take this    metoprolol succinate 25 MG 24 hr tablet Commonly known as: Toprol XL Take 1 tablet (25 mg total) by mouth daily.    mirabegron ER 50 MG Tb24 tablet Commonly known as: MYRBETRIQ Take 1 tablet (50 mg total) by mouth daily.    naloxone 4 MG/0.1ML Liqd nasal spray kit Commonly known as: NARCAN Place 1 spray into the nose once as needed. for opioid overdose    nicotine 10 MG inhaler Commonly known as: NICOTROL Inhale 1 continuous puffing into the lungs as needed.    oxyCODONE 15 MG immediate release tablet Commonly known as: ROXICODONE Take 15 mg by mouth every 6 (six) hours as needed for pain.     OXYGEN Inhale 2-3 L into the lungs daily.    potassium chloride SA 20 MEQ tablet Commonly known as: KLOR-CON M TAKE 1 TABLET BY MOUTH DAILY    predniSONE 20 MG tablet Commonly known as: DELTASONE Take 2 tablets (40 mg total) by mouth daily with breakfast for 3 days. Start taking on: January 11, 2022    PRESCRIPTION MEDICATION Inhale into the lungs at bedtime. cpap    Semaglutide(0.25 or 0.5MG/DOS) 2 MG/3ML Sopn Inject 0.5 Units into the skin once a week. What changed: additional instructions    torsemide 20 MG tablet Commonly known as: DEMADEX Take 2 tablets by mouth daily, you may take an extra 1/2 tablet only as needed for weight gain of 2 lbs overnight or 5 lbs in a week    Trelegy Ellipta 100-62.5-25 MCG/ACT Aepb Generic drug: Fluticasone-Umeclidin-Vilant INHALE 1 PUFF INTO THE LUNGS DAILY    Xarelto 20 MG Tabs tablet Generic drug: rivaroxaban TAKE 1 TABLET(20 MG) BY MOUTH EVERY MORNING What changed: See the new instructions.   No outpatient medications have been marked as taking for the 02/20/22 encounter [facility] Encounter).     Allergies: Allergies as of 02/20/2022   (No Known Allergies)   Past Medical History:  Diagnosis Date   A-fib ([place])    Anxiety    Arthritis    "qwhre; joints, back" (04/17/2017)   Benign breast cyst in female, left 01/08/2017   Found by Screening mammogram, evaluated by U/S on 01/08/17 and determined to be a benign simple breast cyst.   Breast cancer ([place])    Cellulitis of left lower leg 05/30/2017   CHF (congestive heart failure) (HCC)    Chronic low back pain 08/21/2016   Chronic lower back pain    Chronic venous insufficiency    /notes 05/30/2017   COPD (chronic obstructive pulmonary disease) ([place])    Depression    Diabetes mellitus without complication ([place])    DVT (deep venous thrombosis) ([place]) 11/16/2016   Dysrhythmia    GERD (gastroesophageal reflux disease)    Headache    "weekly for the last 3 months" (04/17/2017)    Hyperlipidemia    Hypertension    Morbid obesity ([place])    PE (pulmonary embolism)    Pulmonary embolism ([place]) 09/21/2014   Sleep apnea     Family History:  Family History  Problem Relation Age of Onset   Breast cancer Mother        before age 89   Hypertension Mother    Hyperlipidemia Mother    Hypertension Maternal Grandfather    Hyperlipidemia Maternal Grandfather      Social History:  Currently lives at [facility] [place]  [facility]. Requires help with all ADLs and IADLs requiring mobility. Has not walked in over a year. Does not actively use tobacco, alcohol, other illicit substances. Social History   Socioeconomic History   Marital status: Divorced    Spouse name: Not on file   Number of children: Not on file   Years of education: Not on file   Highest education level: Not on file  Occupational History   Not on file  Tobacco Use   Smoking status: Some Days    Packs/day: 2.00    Years: 55.00    Total pack years: 110.00    Types: Cigarettes    Start date: 08/16/1961   Smokeless tobacco: Never   Tobacco comments:    Pt smokes about 4 ciggs daily  Vaping Use   Vaping Use: Never used  Substance and Sexual Activity   Alcohol use: No   Drug use: No   Sexual activity: Not Currently    Partners: Male  Other Topics Concern   Not on file  Social History Narrative   Lives in [facility] in [place]. Lives alone, but is dependent in ADLs/IADLs. Previously had [place] PT and RN but dismissed them with plans to use the [employer]. Two daughters live nearby. Previously resided in [place].   Social Determinants of Health   Financial Resource Strain: Medium Risk (12/02/2021)   Overall Financial Resource Strain (CARDIA)    Difficulty of Paying Living Expenses: Somewhat hard  Food Insecurity: No Food Insecurity (01/11/2022)   Hunger Vital Sign    Worried About Running Out of Food in the Last Year: Never true    Ran Out of Food in the Last Year: Never  true  Transportation Needs: No Transportation Needs (01/11/2022)   PRAPARE - Hydrologist (Medical): No    Lack of Transportation (Non-Medical): No  Physical Activity: Inactive (12/02/2021)   Exercise Vital Sign    Days of Exercise per Week: 0 days    Minutes of Exercise per Session: 0 min  Stress: No Stress Concern Present (12/02/2021)   [place]    Feeling of Stress : Only a little  Social Connections: Moderately Integrated (12/02/2021)   Social Connection and Isolation Panel [NHANES]    Frequency of Communication with Friends and Family: More than three times a week    Frequency of Social Gatherings with Friends and Family: More than three times a week    Attends Religious Services: More than 4 times per year    Active Member of [employer] or Organizations: Yes    Attends Music therapist: More than 4 times per year    Marital Status: Divorced  Intimate Partner Violence: Not At Risk (01/11/2022)   Humiliation, Afraid, Rape, and Kick questionnaire    Fear of Current or Ex-Partner: No    Emotionally Abused: No    Physically Abused: No    Sexually Abused: No     Review of Systems: A complete ROS was negative except as per HPI.   Physical Exam: Blood pressure (!) 163/105, pulse 87, temperature 98.1 F (36.7 C), temperature source Oral, resp. rate (!) 22, SpO2 100 %.  Gen: Non-toxic, generally well appearing, laying in bed, pleasant Pulm: Increased work of breathing, diffuse expiratory wheezing, crackles at the bilateral bases, diminished breath sounds bilaterally, [place] in place CV: irregularly irregular rhythm, normal rate, normal S1/S2, no m/r/g, JVD difficult to assess given body habitus Extremities: Warm,  2+ bilateral pretibial pitting edema, venous stasis dermatitis of the bilateral LE with scabbed but no open wounds  EKG: personally reviewed my interpretation [id number]  irregular rhythm,Q wave in V1, poor R wave progression V1-3, no ST or T wave changes  CXR: personally reviewed my interpretation is: bilateral fluffy consolidations L>R, bilateral pulmonary venous congestion,  Assessment & Plan by Problem: Principal Problem:   Acute hypoxemic respiratory failure [facility])  Sharon Girtman is a 71 y/o female with a pmh of Afib,COPD, CHF, breast cancer, PE, OSA, HTN, T2DM who presents with SOB.  Acute on Chronic Hypoxic respiratory failureAcute HFpEF exacerbation Patient presents with 1 day of sudden onset SOB and palpitations with recent worsening LE edema. Found to have O2 saturation to the 80s and placed on BiPAP by EMS, now HDS and satting 100% on 2L with minimal tachypnea to 22 breaths per minute. VBG taken after BiPAP placement was normal in the ED.BNP today was elevated to 366, most recent from 01/08/2022 was 46. CXR today with left mid and lower lung nodular opacities and pulmonary vascular congestion, new from prior CXR 01/2022.The chronicity of this is not consistent with recurrence of breast cancer, also her breast cancer was on the R and her cxr shows L>R process.Do not suspect infectious cause given no elevated WBC count, afebrile, no URI symptoms,negative COVID and Flu  and the sudden onset nature. Do not suspect COPD exacerbation given no increased cough or sputum production, although I do suspect her COPD plays a large role in her chronic hypoxic respiratory failure, as well as likely obesity hypoventilation syndrome. The patient has a history of PE but is on xarelto and her new airspace opacities suggest left heart dysfunction, so do not suspect this as the etiology. This is not consistent with MI given EKG was without acute ST/T wave or ischemic changes. Troponins were elevated to 38<48 likely in the setting of demand ischemia.Given elevated BNP and LE edema with new airspace opacities on CXR likely representing pulmonary edema, suspect HFpEF exacerbation as the  cause for her hypoxic respiratory failure. She says she only eats fast food 2-3 times weekly and does not add salt to her foods so do not suspect dietary non-adherence is major contributory factor.Patient reports taking torsemide 22m in the most recent days but says at times she will take 491mor 6075mased on her leg swelling. Do not suspect that she is missing meds, although taking her torsemide at half the prescribed dose is likely contributory. Additionally the patient presented to ED 01/08/2022 for COPD exacerbation and at discharge her losartan and IMDUR were stopped given normal BP. Her BP today has been as high as 199/130. The increased afterload has also likely contributed to her CHF exacerbation. Her weight at most recent discharge was 170.9 kg but there is no current weight recorded for this ED visit. Most recent echo 10/2020 with LVEF 60-65%, moderate LVH, LA and RA mild dilation, mild elevated pulmonary artery pressure, mild to moderate TR. -daily weight -strict I/O -Heart healthy diet -S/P IV lasix 79m30medose in AM pending renal function -daily BMP -f/u repeat troponin and EKG -consider unna boots   COPD OSA She wears 2-3L O2 at home prn and CPAP at night. Patient has diffuse expiratory wheezing and diminished breath sounds. Given no increased cough or sputum production will not treat as COPD exacerbation. Patient has not smoked since 01/02/2022. 02 goal 88-92%. -trelegy ellipta 1 puff daily -duoneb q6 prn -CPAP nightly   HTN BP uncontrolled  and ranging from 135/84-199/130.Recently on losartan 50 mg and Imdur 30 mg TID, but stopped at discharge due to normal BP. Can consider slowly adding and titrating these as needed. No AKI at this time. -Metoprolol 25 mg qd  CVA -lipitor 80 -asa 81  T2DM Ozempic 0.5 weekly  Afib In Afib rhythm, rate controlled with HR 80s-90s. -xarelto 20 mg daily  ER+ breast cancer s/p R lumpectomy -letrozole 2.65m daily  R knee OA -oxy 16m prn  Code Status: Full Code  Dispo: Admit patient to Inpatient with expected length of stay greater than 2 midnights.  Signed: RoIona CoachMD 02/21/2022, 1:08 AM  Pager: [phone] 5pm on weekdays and 1pm on weekends: On Call pager: [phone]

## 2022-02-20 NOTE — ED Provider Notes (Incomplete)
[facility] EMERGENCY DEPARTMENT Provider Note   CSN: [id number] Arrival date & time: 02/20/22  1930     History {Add pertinent medical, surgical, social history, OB history to HPI:1} Chief Complaint  Patient presents with   Shortness of Breath    Sharon Vasquez is a 71 y.o. female.   Shortness of Breath      Home Medications Prior to Admission medications   Medication Sig Start Date End Date Taking? Authorizing Provider  albuterol (VENTOLIN HFA) 108 (90 Base) MCG/ACT inhaler Inhale 2 puffs into the lungs every 4 (four) hours as needed for shortness of breath. 08/25/21   Timothy Lasso, MD  aspirin 81 MG chewable tablet Chew 81 mg by mouth daily. 12/15/20   [provider]  atorvastatin (LIPITOR) 80 MG tablet TAKE 1 TABLET BY MOUTH EVERY DAY Patient taking differently: Take 80 mg by mouth daily. 12/02/21   Atway, Rayann N, DO  esomeprazole (NEXIUM) 40 MG capsule Take 1 capsule (40 mg total) by mouth daily as needed (for heartburn or indigestion). Patient taking differently: Take 40 mg by mouth daily. 01/20/19   Marcelyn Bruins, MD  gabapentin (NEURONTIN) 400 MG capsule TAKE 1 CAPSULE BY MOUTH  TWICE DAILY Patient taking differently: Take 400 mg by mouth at bedtime. 10/05/21   Atway, Rayann N, DO  ipratropium-albuterol (DUONEB) 0.5-2.5 (3) MG/3ML SOLN Take 3 mLs by nebulization every 6 (six) hours as needed. Patient taking differently: Take 3 mLs by nebulization every 6 (six) hours as needed (shortness of breath). 06/18/20   Martyn Ehrich, NP  letrozole ([place]) 2.5 MG tablet TAKE 1 TABLET(2.5 MG) BY MOUTH DAILY Patient taking differently: Take 2.5 mg by mouth daily. TAKE 1 TABLET(2.5 MG) BY MOUTH DAILY 11/28/21   Nicholas Lose, MD  metoprolol succinate (TOPROL-XL) 25 MG 24 hr tablet TAKE 1 TABLET BY MOUTH DAILY 02/09/22   Virl Axe, MD  MYRBETRIQ 50 MG TB24 tablet TAKE 1 TABLET BY MOUTH DAILY 01/25/22   Gaylan Gerold, DO  naloxone [facility])  nasal spray 4 mg/0.1 mL Place 1 spray into the nose once as needed. for opioid overdose Patient not taking: Reported on 01/04/2022 11/02/21   [provider]  nicotine (NICOTROL) 10 MG inhaler Inhale 1 continuous puffing into the lungs as needed.    [provider]  oxyCODONE (ROXICODONE) 15 MG immediate release tablet Take 15 mg by mouth every 6 (six) hours as needed for pain. 08/05/20   [provider]  OXYGEN Inhale 2-3 L into the lungs daily.    [provider]  potassium chloride SA (KLOR-CON M) 20 MEQ tablet TAKE 1 TABLET BY MOUTH DAILY 12/29/21   Atway, Rayann N, DO  PRESCRIPTION MEDICATION Inhale into the lungs at bedtime. cpap    [provider]  rivaroxaban (XARELTO) 20 MG TABS tablet Take 1 tablet (20 mg total) by mouth every morning. 02/13/22   Virl Axe, MD  Semaglutide,0.25 or 0.'5MG'$ /DOS, 2 MG/3ML SOPN Inject 0.5 Units into the skin once a week. Patient taking differently: Inject 0.5 Units into the skin once a week. Wednesday 12/02/21   Masters, Joellen Jersey, DO  torsemide (DEMADEX) 20 MG tablet Take 2 tablets by mouth daily, you may take an extra 1/2 tablet only as needed for weight gain of 2 lbs overnight or 5 lbs in a week 12/02/21   Masters, Katie, DO  TRELEGY ELLIPTA 100-62.5-25 MCG/ACT AEPB INHALE 1 PUFF INTO THE LUNGS DAILY 11/16/21   Rigoberto Noel, MD  Allergies    Patient has no known allergies.    Review of Systems   Review of Systems  Respiratory:  Positive for shortness of breath.     Physical Exam Updated Vital Signs BP (!) 199/130 (BP Location: Left Arm)   Pulse 96   Temp 98.1 F (36.7 C) (Oral)   Resp (!) 25   SpO2 100%  Physical Exam  ED Results / Procedures / Treatments   Labs (all labs ordered are listed, but only abnormal results are displayed) Labs Reviewed - No data to display  EKG None  Radiology No results found.  Procedures Procedures  {Document cardiac monitor, telemetry assessment procedure when  appropriate:1}  Medications Ordered in ED Medications - No data to display  ED Course/ Medical Decision Making/ A&P                           Medical Decision Making  ***  {Document critical care time when appropriate:1} {Document review of labs and clinical decision tools ie heart score, Chads2Vasc2 etc:1}  {Document your independent review of radiology images, and any outside records:1} {Document your discussion with family members, caretakers, and with consultants:1} {Document social determinants of health affecting pt's care:1} {Document your decision making why or why not admission, treatments were needed:1} Final Clinical Impression(s) / ED Diagnoses Final diagnoses:  None    Rx / DC Orders ED Discharge Orders     None

## 2022-02-21 ENCOUNTER — Inpatient Hospital Stay (HOSPITAL_COMMUNITY): Payer: Medicare Other

## 2022-02-21 DIAGNOSIS — E1121 Type 2 diabetes mellitus with diabetic nephropathy: Secondary | ICD-10-CM | POA: Diagnosis present

## 2022-02-21 DIAGNOSIS — J449 Chronic obstructive pulmonary disease, unspecified: Secondary | ICD-10-CM | POA: Diagnosis present

## 2022-02-21 DIAGNOSIS — R0609 Other forms of dyspnea: Secondary | ICD-10-CM

## 2022-02-21 DIAGNOSIS — F17210 Nicotine dependence, cigarettes, uncomplicated: Secondary | ICD-10-CM | POA: Diagnosis present

## 2022-02-21 DIAGNOSIS — Z79899 Other long term (current) drug therapy: Secondary | ICD-10-CM | POA: Diagnosis not present

## 2022-02-21 DIAGNOSIS — I110 Hypertensive heart disease with heart failure: Secondary | ICD-10-CM | POA: Diagnosis present

## 2022-02-21 DIAGNOSIS — E785 Hyperlipidemia, unspecified: Secondary | ICD-10-CM | POA: Diagnosis present

## 2022-02-21 DIAGNOSIS — Z86711 Personal history of pulmonary embolism: Secondary | ICD-10-CM | POA: Diagnosis not present

## 2022-02-21 DIAGNOSIS — Z20822 Contact with and (suspected) exposure to covid-19: Secondary | ICD-10-CM | POA: Diagnosis present

## 2022-02-21 DIAGNOSIS — I5033 Acute on chronic diastolic (congestive) heart failure: Secondary | ICD-10-CM

## 2022-02-21 DIAGNOSIS — Z8249 Family history of ischemic heart disease and other diseases of the circulatory system: Secondary | ICD-10-CM | POA: Diagnosis not present

## 2022-02-21 DIAGNOSIS — Z794 Long term (current) use of insulin: Secondary | ICD-10-CM | POA: Diagnosis not present

## 2022-02-21 DIAGNOSIS — M545 Low back pain, unspecified: Secondary | ICD-10-CM | POA: Diagnosis present

## 2022-02-21 DIAGNOSIS — I4891 Unspecified atrial fibrillation: Secondary | ICD-10-CM | POA: Diagnosis present

## 2022-02-21 DIAGNOSIS — J9601 Acute respiratory failure with hypoxia: Secondary | ICD-10-CM | POA: Diagnosis not present

## 2022-02-21 DIAGNOSIS — E11620 Type 2 diabetes mellitus with diabetic dermatitis: Secondary | ICD-10-CM

## 2022-02-21 DIAGNOSIS — Z853 Personal history of malignant neoplasm of breast: Secondary | ICD-10-CM | POA: Diagnosis not present

## 2022-02-21 DIAGNOSIS — G8929 Other chronic pain: Secondary | ICD-10-CM | POA: Diagnosis present

## 2022-02-21 DIAGNOSIS — G4733 Obstructive sleep apnea (adult) (pediatric): Secondary | ICD-10-CM | POA: Diagnosis present

## 2022-02-21 DIAGNOSIS — J9621 Acute and chronic respiratory failure with hypoxia: Secondary | ICD-10-CM | POA: Diagnosis present

## 2022-02-21 DIAGNOSIS — Z79811 Long term (current) use of aromatase inhibitors: Secondary | ICD-10-CM | POA: Diagnosis not present

## 2022-02-21 DIAGNOSIS — M7989 Other specified soft tissue disorders: Secondary | ICD-10-CM | POA: Diagnosis present

## 2022-02-21 DIAGNOSIS — M1711 Unilateral primary osteoarthritis, right knee: Secondary | ICD-10-CM | POA: Diagnosis present

## 2022-02-21 DIAGNOSIS — F419 Anxiety disorder, unspecified: Secondary | ICD-10-CM | POA: Diagnosis present

## 2022-02-21 DIAGNOSIS — F32A Depression, unspecified: Secondary | ICD-10-CM | POA: Diagnosis present

## 2022-02-21 DIAGNOSIS — Z7901 Long term (current) use of anticoagulants: Secondary | ICD-10-CM | POA: Diagnosis not present

## 2022-02-21 DIAGNOSIS — K219 Gastro-esophageal reflux disease without esophagitis: Secondary | ICD-10-CM | POA: Diagnosis present

## 2022-02-21 DIAGNOSIS — I872 Venous insufficiency (chronic) (peripheral): Secondary | ICD-10-CM | POA: Diagnosis present

## 2022-02-21 LAB — BASIC METABOLIC PANEL
Anion gap: 12 (ref 5–15)
Anion gap: 9 (ref 5–15)
BUN: 10 mg/dL (ref 8–23)
BUN: 10 mg/dL (ref 8–23)
CO2: 23 mmol/L (ref 22–32)
CO2: 25 mmol/L (ref 22–32)
Calcium: 9.1 mg/dL (ref 8.9–10.3)
Calcium: 9.6 mg/dL (ref 8.9–10.3)
Chloride: 106 mmol/L (ref 98–111)
Chloride: 110 mmol/L (ref 98–111)
Creatinine, Ser: 0.76 mg/dL (ref 0.44–1.00)
Creatinine, Ser: 0.83 mg/dL (ref 0.44–1.00)
GFR, Estimated: >60 mL/min (ref >60)
GFR, Estimated: >60 mL/min (ref >60)
Glucose, Bld: 104 mg/dL — ABNORMAL HIGH (ref 70–99)
Glucose, Bld: 128 mg/dL — ABNORMAL HIGH (ref 70–99)
Potassium: 3.7 mmol/L (ref 3.5–5.1)
Potassium: 3.9 mmol/L (ref 3.5–5.1)
Sodium: 142 mmol/L (ref 135–145)
Sodium: 143 mmol/L (ref 135–145)

## 2022-02-21 LAB — TROPONIN I (HIGH SENSITIVITY)
Troponin I (High Sensitivity): 38 ng/L — ABNORMAL HIGH (ref <18)
Troponin I (High Sensitivity): 48 ng/L — ABNORMAL HIGH (ref <18)
Troponin I (High Sensitivity): 48 ng/L — ABNORMAL HIGH (ref <18)
Troponin I (High Sensitivity): 29 ng/L — ABNORMAL HIGH (ref <18)

## 2022-02-21 LAB — ECHOCARDIOGRAM COMPLETE: S' Lateral: 3.4 cm

## 2022-02-21 LAB — CBC
HCT: 35.9 % — ABNORMAL LOW (ref 36.0–46.0)
Hemoglobin: 12.2 g/dL (ref 12.0–15.0)
MCH: 29 pg (ref 26.0–34.0)
MCHC: 34 g/dL (ref 30.0–36.0)
MCV: 85.5 fL (ref 80.0–100.0)
Platelets: 404 10*3/uL — ABNORMAL HIGH (ref 150–400)
RBC: 4.2 MIL/uL (ref 3.87–5.11)
RDW: 14.5 % (ref 11.5–15.5)
WBC: 6.7 10*3/uL (ref 4.0–10.5)
nRBC: 0 % (ref 0.0–0.2)

## 2022-02-21 MED ORDER — UMECLIDINIUM BROMIDE 62.5 MCG/ACT IN AEPB
1.0000 | INHALATION_SPRAY | Freq: Every day | RESPIRATORY_TRACT | Status: DC
  Administered 2022-02-21 – 2022-02-22 (×2): 1 via RESPIRATORY_TRACT
  Filled 2022-02-21: qty 7

## 2022-02-21 MED ORDER — FUROSEMIDE 10 MG/ML IJ SOLN: 40.0000 mg | Freq: Once | INTRAMUSCULAR | Status: DC

## 2022-02-21 MED ORDER — LETROZOLE 2.5 MG PO TABS
2.5000 mg | ORAL_TABLET | Freq: Every day | ORAL | Status: DC
  Administered 2022-02-21 – 2022-02-22 (×2): 2.5 mg via ORAL
  Filled 2022-02-21 (×3): qty 1

## 2022-02-21 MED ORDER — FLUTICASONE FUROATE-VILANTEROL 200-25 MCG/ACT IN AEPB
1.0000 | INHALATION_SPRAY | Freq: Every day | RESPIRATORY_TRACT | Status: DC
  Administered 2022-02-21 – 2022-02-22 (×2): 1 via RESPIRATORY_TRACT
  Filled 2022-02-21: qty 28

## 2022-02-21 MED ORDER — FUROSEMIDE 10 MG/ML IJ SOLN
80.0000 mg | Freq: Once | INTRAMUSCULAR | Status: AC
  Administered 2022-02-21: 80 mg via INTRAVENOUS
  Filled 2022-02-21: qty 8

## 2022-02-21 MED ORDER — METOPROLOL TARTRATE 25 MG PO TABS
25.0000 mg | ORAL_TABLET | Freq: Two times a day (BID) | ORAL | Status: DC
  Administered 2022-02-21 – 2022-02-22 (×3): 25 mg via ORAL
  Filled 2022-02-21 (×3): qty 1

## 2022-02-21 MED ORDER — IPRATROPIUM-ALBUTEROL 0.5-2.5 (3) MG/3ML IN SOLN
3.0000 mL | RESPIRATORY_TRACT | Status: AC
  Administered 2022-02-21 (×2): 3 mL via RESPIRATORY_TRACT
  Filled 2022-02-21 (×2): qty 3

## 2022-02-21 MED ORDER — OXYCODONE HCL 5 MG PO TABS
15.0000 mg | ORAL_TABLET | Freq: Every day | ORAL | Status: DC | PRN
  Administered 2022-02-21 – 2022-02-22 (×3): 15 mg via ORAL
  Filled 2022-02-21 (×3): qty 3

## 2022-02-21 MED ORDER — RIVAROXABAN 10 MG PO TABS
20.0000 mg | ORAL_TABLET | Freq: Every day | ORAL | Status: DC
  Administered 2022-02-21: 20 mg via ORAL
  Filled 2022-02-21: qty 2

## 2022-02-21 MED ORDER — PANTOPRAZOLE SODIUM 40 MG PO TBEC
40.0000 mg | DELAYED_RELEASE_TABLET | Freq: Every day | ORAL | Status: DC
  Administered 2022-02-21 – 2022-02-22 (×2): 40 mg via ORAL
  Filled 2022-02-21 (×2): qty 1

## 2022-02-21 MED ORDER — GABAPENTIN 400 MG PO CAPS: 400.0000 mg | ORAL_CAPSULE | Freq: Every evening | ORAL | Status: DC | PRN

## 2022-02-21 MED ORDER — ATORVASTATIN CALCIUM 80 MG PO TABS
80.0000 mg | ORAL_TABLET | Freq: Every day | ORAL | Status: DC
  Administered 2022-02-21 – 2022-02-22 (×2): 80 mg via ORAL
  Filled 2022-02-21: qty 2
  Filled 2022-02-21: qty 1

## 2022-02-21 MED ORDER — FUROSEMIDE 10 MG/ML IJ SOLN
40.0000 mg | Freq: Once | INTRAMUSCULAR | Status: AC
  Administered 2022-02-21: 40 mg via INTRAVENOUS
  Filled 2022-02-21: qty 4

## 2022-02-21 MED ORDER — ALBUTEROL SULFATE (2.5 MG/3ML) 0.083% IN NEBU
3.0000 mL | INHALATION_SOLUTION | RESPIRATORY_TRACT | Status: DC | PRN
  Filled 2022-02-21: qty 3

## 2022-02-21 NOTE — Progress Notes (Signed)
  Echocardiogram 2D Echocardiogram has been performed.  Sharon Vasquez 02/21/2022, 2:38 PM

## 2022-02-21 NOTE — ED Notes (Signed)
Heart healthy diet ordered for patient.  

## 2022-02-21 NOTE — Hospital Course (Signed)
Sharon Vasquez is a 71 y.o. female with past medical history of atrial fibrillation, COPD, CHF, breast cancer, PE, OSA, hypertension, type 2 diabetes who presented with concerns of shortness of breath.  Patient found to be in acute on chronic hypoxic respiratory failure secondary to acute heart failure with preserved ejection fraction exacerbation.  Patient admitted for further workup and treatment.   #Acute on chronic hypoxic respiratory failure #Acute HFpEF exacerbation Patient was found to have acute HFpEF exacerbation during hospitalization.  Patient initially required BiPAP.  She was then transitioned off BiPAP on 2 L nasal cannula, which she is on at baseline.  Patient was diuresed with Lasix in the hospital with more than 5 L output.  Patient respiratory status came back to baseline. Patient's echo during hospitalization showed preserved ejection fraction of 60 to 65%. Patient is doing well.  Patient cleared for discharge.  Patient be continued on home metoprolol succinate 25 mg daily.  Patient discharged on losartan 25 mg daily as well as empagliflozin 10 mg daily.  Patient to take torsemide as needed for fluid overload. Patient initially found to have sudden onset of shortness of breath and palpitations.     #Hypertension  Patient has past medical history of hypertension.  Patient initially came in with elevated blood pressures into the 160s to 170s.  This could have played a role in her heart failure exacerbation.  Patient was read taken off of her Imdur 30 mg 3 times daily and losartan 50 mg daily due to good blood pressures at her last admission.  Patient had elevated blood pressures and started back on losartan 25 mg at discharge.  The patient continues to have increased blood pressures, consideration of titrating up losartan or can start back Imdur.  Patient to continue metoprolol 25 mg daily.  #COPD Patient had no COPD exacerbation while in hospital.  Patient remained on 2 L of oxygen during  hospitalization.  Patient has been continued on home inhalers.  No concerns.  Patient was continued on 2 L nasal cannula.  Patient will be discharged on Trelegy and as needed albuterol nebulizers.    #Type 2 diabetes mellitus #Diabetic nephropathy Patient has a past medical history of type 2 diabetes.  Patient is not on any medications for this.  This is diet controlled.  Patient's most recent A1c on 12/02/2021 was 5.8.   #Atrial fibrillation Patient remained in atrial fibrillation, but was rate controlled.  Patient on metoprolol succinate 25 mg daily.  Patient also on Xarelto 20 mg daily.  Patient was continued on this.   #Right knee osteoarthritis No acute concerns hospital, patient remained on oxycodone as needed for pain.   #OSA Continued home CPAP nightly    #ER + breast cancer status post right lumpectomy Continued home letrozole 2.5 mg daily   #Hyperlipidemia Continued 80 mg atorvastatin daily

## 2022-02-21 NOTE — Progress Notes (Signed)
HD#0 Subjective:   Summary: This is a 71 year old female with past medical history of atrial fibrillation, COPD, CHF, breast cancer, PE, OSA, hypertension, type 2 diabetes who presented with concerns of shortness of breath.  Patient found to be in acute on chronic hypoxic respiratory failure secondary to acute heart failure with preserved ejection fraction exacerbation.  Patient admitted for further workup and treatment.  Overnight Events: No overnight events  Patient reports that she is breathing much better.  Patient is no longer on BiPAP.  She states that she is had a lot of urination overnight.  She currently is on 2 L of oxygen during my evaluation, which is her baseline.  She states that she feels like she is back to her baseline.  Patient is unsure what happened, but states that she felt palpitations and short of breath on the incident at [facility]  She currently denies any shortness of breath, chest pain, headache, or dizziness.  Objective:  Vital signs in last 24 hours: Vitals:   02/21/22 1036 02/21/22 1037 02/21/22 1059 02/21/22 1059  BP: (!) 173/94     Pulse: 92  88   Resp: '20 20 20   '$ Temp:    98.5 F (36.9 C)  TempSrc:    Oral  SpO2: 96%  95%    Supplemental O2: Nasal Cannula SpO2: 95 % O2 Flow Rate (L/min): 2 L/min FiO2 (%): 40 %   Physical Exam:  Constitutional: Patient is chronically ill-appearing, sitting in bed, with nasal cannula in place HENT: normocephalic atraumatic, mucous membranes moist Eyes: conjunctiva non-erythematous Neck: supple Cardiovascular: Irregular irregular rhythm Pulmonary/Chest: Bilateral crackles noted with no increased work of breathing noted Abdominal: soft, non-tender, non-distended MSK: normal bulk and tone Neurological: alert & oriented x 3, 1/5 strength in bilateral lower extremities on hip extension Skin: warm and dry Psych: Normal mood and affect Extremities: Noticeable edema and blistering noted to bilateral lower  extremities, likely secondary to venous insufficiency, no open wounds or drainage noted.  2+ pitting edema  There were no vitals filed for this visit.   Intake/Output Summary (Last 24 hours) at 02/21/2022 1352 Last data filed at 02/21/2022 0525 Gross per 24 hour  Intake --  Output 4950 ml  Net -4950 ml   Net IO Since Admission: -4,950 mL [02/21/22 1352]  Pertinent Labs:    Latest Ref Rng & Units 02/21/2022   12:05 AM 02/20/2022    8:52 PM 02/20/2022    7:40 PM  CBC  WBC 4.0 - 10.5 K/uL 6.7   6.7   Hemoglobin 12.0 - 15.0 g/dL 12.2  12.9  12.8   Hematocrit 36.0 - 46.0 % 35.9  38.0  39.0   Platelets 150 - 400 K/uL 404   438        Latest Ref Rng & Units 02/21/2022    1:39 AM 02/21/2022   12:05 AM 02/20/2022    8:52 PM  CMP  Glucose 70 - 99 mg/dL 128  104    BUN 8 - 23 mg/dL 10  10    Creatinine 0.44 - 1.00 mg/dL 0.83  0.76    Sodium 135 - 145 mmol/L 143  142  144   Potassium 3.5 - 5.1 mmol/L 3.7  3.9  3.6   Chloride 98 - 111 mmol/L 106  110    CO2 22 - 32 mmol/L 25  23    Calcium 8.9 - 10.3 mg/dL 9.6  9.1      Imaging: DG Chest Portable 1  View  Result Date: 02/20/2022 CLINICAL DATA:  Shortness of breath EXAM: PORTABLE CHEST 1 VIEW COMPARISON:  Chest x-ray 01/08/2022 FINDINGS: Heart is enlarged. There central pulmonary vascular congestion. There are patchy and nodular opacities throughout the left mid and lower lung. There is no pleural effusion or pneumothorax. No acute fractures are seen. IMPRESSION: 1. Cardiomegaly with central pulmonary vascular congestion. 2. Patchy and nodular opacities throughout the left mid and lower lung, which could be infectious or inflammatory. Short-term follow-up imaging recommended to confirm resolution and to exclude other etiologies. Electronically Signed   By: Ronney Asters M.D.   On: 02/20/2022 21:11    Assessment/Plan:   Principal Problem:   Acute hypoxemic respiratory failure [facility])   Patient Summary: Sharon Vasquez is a 71  y.o. female with past medical history of atrial fibrillation, COPD, CHF, breast cancer, PE, OSA, hypertension, type 2 diabetes who presented with concerns of shortness of breath.  Patient found to be in acute on chronic hypoxic respiratory failure secondary to acute heart failure with preserved ejection fraction exacerbation.  Patient admitted for further workup and treatment.  #Acute on chronic hypoxic respiratory failure #Acute HFpEF exacerbation Patient initially found to have sudden onset of shortness of breath and palpitations.  Patient also had worsening lower extremity edema.  Patient admitted and started on 80 mg Lasix IV.  Initial BNP was 336, which was elevated from 46 (01/08/2022).  Patient has already had 5 L output of volume after 80 mg of Lasix IV.  Patient's respiratory status is improving.  Patient continues to have moderate crackles to bilateral lung bases as well as the middle lobes.  Patient could benefit from more diuresing.  BNP within normal limits.  Troponins have stabilized.  EKG unchanged from previous. -Daily weights and strict I's and O's -Heart healthy diet -Add Lasix 40 mg IV -Continue metoprolol tartrate 25 mg twice daily -Echo pending  #Hypertension Patient has a past medical history of hypertension.  Patient was recently seen and admitted back in October 2023, and at that time patient was discontinued on losartan 50 mg and Imdur 30 mg 3 times daily given his blood pressure at the time was normal.  Patient's current regimen includes metoprolol tartrate 25 mg twice daily.  Patient's current blood pressure elevated into the 170s.  Given Lasix, will help with this.  Consider starting losartan 50 mg back. -Continue to monitor blood pressure -Continue with Lasix 40 mg -Continue metoprolol tartrate 25 mg twice daily -Consider adding losartan 50 mg daily  #COPD Patient currently not wheezing, and has no indication of acute COPD exacerbation.  Will continue to monitor.   Patient is currently on 2 L of oxygen, which is her baseline at home.  Goal oxygenation will be between 88-92%.  At rest, patient sats well into the 90s at rest on 1 L oxygen, patient only needs oxygen while ambulating and seems.  Will continue to monitor.  Patient does take Trelegy at home. -Continue Breo 1 puff daily -Continue Incruse 1 puff daily -Continue albuterol nebs every 4 hours as needed for any worsening shortness of breath  #Type 2 diabetes mellitus #Diabetic nephropathy Patient has a past medical history of type 2 diabetes.  #Atrial fibrillation Patient does remain in atrial fibrillation, but patient is rate controlled.  Patient's current heart rate measuring at 88.  Patient also has anticoagulation with Xarelto. -Continue Xarelto 20 mg daily -Continue to monitor  #Right knee osteoarthritis No acute concerns at this time patient takes home oxycodone  15 mg as needed for pain -Continue oxycodone 50 mg as needed for breakthrough pain  #OSA -Continue home CPAP nightly   #ER + breast cancer status post right lumpectomy -Continue home letrozole 2.5 mg daily  #Hyperlipidemia -Continue 80 mg atorvastatin daily  Diet: Heart Healthy IVF: None,None VTE: Patient on Xarelto 20 mg daily Code: Full  Dispo: Anticipated discharge to Home in 3 days pending clinical improvement.   [place] [facility] Resident PGY-1 [phone] Please contact the on call pager after 5 pm and on weekends at [phone].

## 2022-02-21 NOTE — ED Notes (Signed)
Messaged attending to infomr pt is c/o of cramping and wants potassium pills

## 2022-02-21 NOTE — ED Notes (Signed)
RT removed pt from Bipap and placed pt on 2L [place]. Providers at bedside. Pt tolerating 2L .

## 2022-02-22 ENCOUNTER — Encounter: Payer: Medicare Other | Admitting: Internal Medicine

## 2022-02-22 ENCOUNTER — Other Ambulatory Visit (HOSPITAL_COMMUNITY): Payer: Self-pay

## 2022-02-22 LAB — BASIC METABOLIC PANEL
Anion gap: 11 (ref 5–15)
BUN: 12 mg/dL (ref 8–23)
CO2: 23 mmol/L (ref 22–32)
Calcium: 8.9 mg/dL (ref 8.9–10.3)
Chloride: 107 mmol/L (ref 98–111)
Creatinine, Ser: 0.84 mg/dL (ref 0.44–1.00)
GFR, Estimated: >60 mL/min (ref >60)
Glucose, Bld: 95 mg/dL (ref 70–99)
Potassium: 3.7 mmol/L (ref 3.5–5.1)
Sodium: 141 mmol/L (ref 135–145)

## 2022-02-22 MED ORDER — LOSARTAN POTASSIUM 25 MG PO TABS
25.0000 mg | ORAL_TABLET | Freq: Every day | ORAL | 11 refills | Status: DC
  Filled 2022-02-22: qty 30, 30d supply, fill #0

## 2022-02-22 MED ORDER — EMPAGLIFLOZIN 10 MG PO TABS
10.0000 mg | ORAL_TABLET | Freq: Every day | ORAL | 0 refills | Status: AC
  Filled 2022-02-22: qty 90, 90d supply, fill #0

## 2022-02-22 MED ORDER — IPRATROPIUM-ALBUTEROL 0.5-2.5 (3) MG/3ML IN SOLN
3.0000 mL | Freq: Once | RESPIRATORY_TRACT | Status: AC
  Administered 2022-02-22: 3 mL via RESPIRATORY_TRACT
  Filled 2022-02-22: qty 3

## 2022-02-22 NOTE — Discharge Summary (Addendum)
Name: Sharon Vasquez [name]: [id number] DOB: 09-13-1950 71 y.o. PCP: [name], DO  Date of Admission: 02/20/2022  7:30 PM Date of Discharge: 02/22/2022 Attending Physician: Dr. Lita Mains   Discharge Diagnosis: Principal Problem:   Acute hypoxemic respiratory failure [facility])    Discharge Medications: Allergies as of 02/22/2022   No Known Allergies      Medication List     TAKE these medications    albuterol 108 (90 Base) MCG/ACT inhaler Commonly known as: VENTOLIN HFA Inhale 2 puffs into the lungs every 4 (four) hours as needed for shortness of breath.   aspirin 81 MG chewable tablet Chew 81 mg by mouth daily.   atorvastatin 80 MG tablet Commonly known as: LIPITOR TAKE 1 TABLET BY MOUTH EVERY DAY What changed:  how much to take how to take this when to take this additional instructions   empagliflozin 10 MG Tabs tablet Commonly known as: JARDIANCE Take 1 tablet (10 mg total) by mouth daily before breakfast.   esomeprazole 40 MG capsule Commonly known as: NEXIUM Take 1 capsule (40 mg total) by mouth daily as needed (for heartburn or indigestion). What changed: when to take this   gabapentin 400 MG capsule Commonly known as: NEURONTIN TAKE 1 CAPSULE BY MOUTH  TWICE DAILY What changed: when to take this   ipratropium-albuterol 0.5-2.5 (3) MG/3ML Soln Commonly known as: DUONEB Take 3 mLs by nebulization every 6 (six) hours as needed. What changed: reasons to take this   letrozole 2.5 MG tablet Commonly known as: FEMARA TAKE 1 TABLET(2.5 MG) BY MOUTH DAILY What changed:  how much to take how to take this when to take this   losartan 25 MG tablet Commonly known as: Cozaar Take 1 tablet (25 mg total) by mouth daily.   metoprolol succinate 25 MG 24 hr tablet Commonly known as: TOPROL-XL TAKE 1 TABLET BY MOUTH DAILY   Myrbetriq 50 MG Tb24 tablet Generic drug: mirabegron ER TAKE 1 TABLET BY MOUTH DAILY   naloxone 4 MG/0.1ML Liqd nasal spray  kit Commonly known as: NARCAN Place 1 spray into the nose once as needed. for opioid overdose   nicotine 10 MG inhaler Commonly known as: NICOTROL Inhale 1 continuous puffing into the lungs as needed for smoking cessation.   oxyCODONE 15 MG immediate release tablet Commonly known as: ROXICODONE Take 15 mg by mouth every 6 (six) hours as needed for pain.   OXYGEN Inhale 2-3 L into the lungs daily.   potassium chloride SA 20 MEQ tablet Commonly known as: KLOR-CON M TAKE 1 TABLET BY MOUTH DAILY   PRESCRIPTION MEDICATION Inhale into the lungs at bedtime. cpap   rivaroxaban 20 MG Tabs tablet Commonly known as: Xarelto Take 1 tablet (20 mg total) by mouth every morning.   Semaglutide(0.25 or 0.5MG/DOS) 2 MG/3ML Sopn Inject 0.5 Units into the skin once a week. What changed: additional instructions   torsemide 20 MG tablet Commonly known as: DEMADEX Take 2 tablets by mouth daily, you may take an extra 1/2 tablet only as needed for weight gain of 2 lbs overnight or 5 lbs in a week   Trelegy Ellipta 100-62.5-25 MCG/ACT Aepb Generic drug: Fluticasone-Umeclidin-Vilant INHALE 1 PUFF INTO THE LUNGS DAILY        Disposition and follow-up:   Sharon Vasquez was discharged from [facility] in Good condition.  At the hospital follow up visit please address:  1.  Follow-up:  a.  Acute HFpEF exacerbation: Patient initially  presented short of breath, found to be in acute heart failure exacerbation.  Patient was diuresed with more than 5 L output.  Patient discharged on empagliflozin as well as losartan.  Patient will continue metoprolol.  At follow-up, ensure patient's respiratory status is stable, and patient's electrolytes are within normal limits.    b.  Hypertension: Patient was started on losartan 25 mg daily for hypertension while in the hospital.  Patient will be discharged on 25 mg losartan daily.  Please follow-up BMP.  Please titrate losartan as needed for good  blood pressure control with goal being less than 140/80.  2.  Labs / imaging needed at time of follow-up:   3.  Pending labs/ test needing follow-up: BMP  4.  Medication Changes  1) Patient started on empagliflozin 10 mg daily  2) Patient started on losartan 25 mg daily  Follow-up Appointments:  Follow-up Information     Atway, Rayann N, DO Follow up on 03/09/2022.   Specialty: Internal Medicine Why: Please follow-up with Dr. Raymondo Band at the internal medicine clinic at 10:15 AM on 03/09/2022. Contact information: [place] [address] [phone]                 Follow-up Information     [name], DO Follow up on 03/09/2022.   Specialty: Internal Medicine Why: Please follow-up with Dr. Raymondo Band at the internal medicine clinic at 10:15 AM on 03/09/2022. Contact information: [place] [address] [phone]               Follow-up scheduled with Dr. Raymondo Band at the internal medicine center at 10:15am on 03/09/2022  Hospital Course by problem list: Sharon Vasquez is a 71 y.o. female with past medical history of atrial fibrillation, COPD, CHF, breast cancer, PE, OSA, hypertension, type 2 diabetes who presented with concerns of shortness of breath.  Patient found to be in acute on chronic hypoxic respiratory failure secondary to acute heart failure with preserved ejection fraction exacerbation.  Patient admitted for further workup and treatment.   #Acute on chronic hypoxic respiratory failure #Acute HFpEF exacerbation Patient was found to have acute HFpEF exacerbation during hospitalization.  Patient initially required BiPAP.  She was then transitioned off BiPAP on 2 L nasal cannula, which she is on at baseline.  Patient was diuresed with Lasix in the hospital with more than 5 L output.  Patient respiratory status came back to baseline. Patient's echo during hospitalization showed preserved ejection fraction of 60 to 65%. Patient  is doing well.  Patient cleared for discharge.  Patient be continued on home metoprolol succinate 25 mg daily.  Patient discharged on losartan 25 mg daily as well as empagliflozin 10 mg daily.  Patient to take torsemide as needed for fluid overload. Patient initially found to have sudden onset of shortness of breath and palpitations.     #Hypertension  Patient has past medical history of hypertension.  Patient initially came in with elevated blood pressures into the 160s to 170s.  This could have played a role in her heart failure exacerbation.  Patient was read taken off of her Imdur 30 mg 3 times daily and losartan 50 mg daily due to good blood pressures at her last admission.  Patient had elevated blood pressures and started back on losartan 25 mg at discharge.  The patient continues to have increased blood pressures, consideration of titrating up losartan or can start back Imdur.  Patient to continue metoprolol 25  mg daily.  #COPD Patient had no COPD exacerbation while in hospital.  Patient remained on 2 L of oxygen during hospitalization.  Patient has been continued on home inhalers.  No concerns.  Patient was continued on 2 L nasal cannula.  Patient will be discharged on Trelegy and as needed albuterol nebulizers.    #Type 2 diabetes mellitus #Diabetic nephropathy Patient has a past medical history of type 2 diabetes.  Patient is not on any medications for this.  This is diet controlled.  Patient's most recent A1c on 12/02/2021 was 5.8.   #Atrial fibrillation Patient remained in atrial fibrillation, but was rate controlled.  Patient on metoprolol succinate 25 mg daily.  Patient also on Xarelto 20 mg daily.  Patient was continued on this.   #Right knee osteoarthritis No acute concerns hospital, patient remained on oxycodone as needed for pain.   #OSA Continued home CPAP nightly    #ER + breast cancer status post right lumpectomy Continued home letrozole 2.5 mg daily    #Hyperlipidemia Continued 80 mg atorvastatin daily   Discharge Subjective: Patient doing much better.  She states that she is ready go home.  She has no acute concerns at this time.  She feels that she is back at her baseline.  She denies any leg edema.  She denies any concerns at this time.  Discharge Exam:   BP (!) 163/110   Pulse 84   Temp 97.8 F (36.6 C) (Oral)   Resp 15   SpO2 100%  Constitutional: Chronically appearing woman resting in bed comfortably in no acute distress. HENT: normocephalic atraumatic, mucous membranes moist Eyes: conjunctiva non-erythematous Neck: supple Cardiovascular: Irregularly irregular rhythm Pulmonary/Chest: Some expiratory wheezing noted.  Bibasilar crackles noted. Abdominal: soft, non-tender, non-distended MSK: normal bulk and tone Neurological: alert & oriented x 3, 1/5 strength to bilateral lower extremities on hip flexion Skin: warm and dry Psych: Normal mood and affect Extremities: 1+ pitting edema noted to bilateral lower extremities, with chronic venous insufficiency changes noted  Pertinent Labs, Studies, and Procedures:     Latest Ref Rng & Units 02/21/2022   12:05 AM 02/20/2022    8:52 PM 02/20/2022    7:40 PM  CBC  WBC 4.0 - 10.5 K/uL 6.7   6.7   Hemoglobin 12.0 - 15.0 g/dL 12.2  12.9  12.8   Hematocrit 36.0 - 46.0 % 35.9  38.0  39.0   Platelets 150 - 400 K/uL 404   438        Latest Ref Rng & Units 02/22/2022    3:20 AM 02/21/2022    1:39 AM 02/21/2022   12:05 AM  CMP  Glucose 70 - 99 mg/dL 95  128  104   BUN 8 - 23 mg/dL _0 Creatinine 0.44 - 1.00 mg/dL 0.84  0.83  0.76   Sodium 135 - 145 mmol/L 141  143  142   Potassium 3.5 - 5.1 mmol/L 3.7  3.7  3.9   Chloride 98 - 111 mmol/L 107  106  110   CO2 22 - 32 mmol/L _1 Calcium 8.9 - 10.3 mg/dL 8.9  9.6  9.1     ECHOCARDIOGRAM COMPLETE  Result Date: 02/21/2022    ECHOCARDIOGRAM REPORT   Patient Name:   Sharon Vasquez Date of Exam: 02/21/2022  Medical Rec #:  [id number]      Height:       74.0 in Accession #:  [id number]     Weight:       376.8 lb Date of Birth:  05/28/1950      BSA:          2.845 m Patient Age:    47 years       BP:           173/94 mmHg Patient Gender: F              HR:           74 bpm. Exam Location:  Inpatient Procedure: 2D Echo Indications:    dyspnea  History:        Patient has prior history of Echocardiogram examinations, most                 recent 10/08/2020. COPD, Arrythmias:Atrial Fibrillation; Risk                 Factors:Sleep Apnea, Current Smoker, Hypertension and Diabetes.  Sonographer:    Johny Chess RDCS Referring Phys: [id number] JEFFREY C HATCHER  Sonographer Comments: Patient is obese. Image acquisition challenging due to patient body habitus. IMPRESSIONS  1. Left ventricular ejection fraction, by estimation, is 60 to 65%. The left ventricle has normal function. The left ventricle has no regional wall motion abnormalities. There is moderate concentric left ventricular hypertrophy. Left ventricular diastolic parameters are indeterminate.  2. Right ventricular systolic function is normal. The right ventricular size is normal. There is normal pulmonary artery systolic pressure.  3. Left atrial size was mildly dilated.  4. The mitral valve is grossly normal. Trivial mitral valve regurgitation. No evidence of mitral stenosis.  5. The aortic valve is tricuspid. There is mild calcification of the aortic valve. Aortic valve regurgitation is not visualized. Aortic valve sclerosis is present, with no evidence of aortic valve stenosis.  6. There is mild dilatation of the ascending aorta, measuring 40 mm.  7. The inferior vena cava is dilated in size with >50% respiratory variability, suggesting right atrial pressure of 8 mmHg. Comparison(s): No significant change from prior study. FINDINGS  Left Ventricle: Left ventricular ejection fraction, by estimation, is 60 to 65%. The left ventricle has normal function. The left  ventricle has no regional wall motion abnormalities. The left ventricular internal cavity size was normal in size. There is  moderate concentric left ventricular hypertrophy. Left ventricular diastolic parameters are indeterminate. Right Ventricle: The right ventricular size is normal. No increase in right ventricular wall thickness. Right ventricular systolic function is normal. There is normal pulmonary artery systolic pressure. The tricuspid regurgitant velocity is 2.53 m/s, and  with an assumed right atrial pressure of 8 mmHg, the estimated right ventricular systolic pressure is 50.5 mmHg. Left Atrium: Left atrial size was mildly dilated. Right Atrium: Right atrial size was normal in size. Pericardium: There is no evidence of pericardial effusion. Mitral Valve: The mitral valve is grossly normal. Trivial mitral valve regurgitation. No evidence of mitral valve stenosis. Tricuspid Valve: The tricuspid valve is normal in structure. Tricuspid valve regurgitation is trivial. No evidence of tricuspid stenosis. Aortic Valve: The aortic valve is tricuspid. There is mild calcification of the aortic valve. Aortic valve regurgitation is not visualized. Aortic valve sclerosis is present, with no evidence of aortic valve stenosis. Pulmonic Valve: The pulmonic valve was not well visualized. Pulmonic valve regurgitation is not visualized. No evidence of pulmonic stenosis. Aorta: The ascending aorta was not well visualized. There is mild dilatation of the ascending aorta, measuring 40 mm. Venous: The inferior  vena cava is dilated in size with greater than 50% respiratory variability, suggesting right atrial pressure of 8 mmHg. IAS/Shunts: No atrial level shunt detected by color flow Doppler.  LEFT VENTRICLE PLAX 2D LVIDd:         5.70 cm LVIDs:         3.40 cm LV PW:         1.20 cm LV IVS:        1.30 cm LVOT diam:     2.00 cm LVOT Area:     3.14 cm  IVC IVC diam: 2.70 cm LEFT ATRIUM              Index        RIGHT ATRIUM            Index LA diam:        3.60 cm  1.27 cm/m   RA Area:     19.60 cm LA Vol (A2C):   93.1 ml  32.73 ml/m  RA Volume:   50.10 ml  17.61 ml/m LA Vol (A4C):   93.7 ml  32.94 ml/m LA Biplane Vol: 102.0 ml 35.86 ml/m   AORTA Ao Root diam: 3.20 cm Ao Asc diam:  4.00 cm TRICUSPID VALVE TR Peak grad:   25.6 mmHg TR Vmax:        253.00 cm/s  SHUNTS Systemic Diam: 2.00 cm Rudean Haskell MD Electronically signed by Rudean Haskell MD Signature Date/Time: 02/21/2022/2:25:17 PM    Final    DG Chest Portable 1 View  Result Date: 02/20/2022 CLINICAL DATA:  Shortness of breath EXAM: PORTABLE CHEST 1 VIEW COMPARISON:  Chest x-ray 01/08/2022 FINDINGS: Heart is enlarged. There central pulmonary vascular congestion. There are patchy and nodular opacities throughout the left mid and lower lung. There is no pleural effusion or pneumothorax. No acute fractures are seen. IMPRESSION: 1. Cardiomegaly with central pulmonary vascular congestion. 2. Patchy and nodular opacities throughout the left mid and lower lung, which could be infectious or inflammatory. Short-term follow-up imaging recommended to confirm resolution and to exclude other etiologies. Electronically Signed   By: Ronney Asters M.D.   On: 02/20/2022 21:11     Discharge Instructions:  Sharon Vasquez  It was a pleasure taking care of you at [facility] were admitted for heart failure exacerbation, you were treated with Lasix.  We are discharging you home now that you are doing better. Please follow the following instructions.   1) Regarding your heart failure, I want you to continue taking your medications as prescribed including your metoprolol 25 mg daily.  I have added a new medication called empagliflozin, I want you to start taking 10 mg daily of this medication.  2) Please follow-up with Dr. Raymondo Band on December 7th, at 10:15 AM for follow-up appointment at the internal medicine clinic.  3) If you do start feeling that you  are gaining weight regarding your fluid, please take your torsemide as directed.  4) Regarding your hypertension, I have started you on losartan 25 mg daily for your blood pressure.  Please take this daily.  Take care,  Dr. Leigh Aurora, DO  Discharge Instructions     Call MD for:  difficulty breathing, headache or visual disturbances   Complete by: As directed    Diet - low sodium heart healthy   Complete by: As directed    Increase activity slowly   Complete by: As directed        Signed: Posey Pronto,  Hyman Hopes, DO 02/22/2022, 9:48 AM   Pager: [phone]

## 2022-02-22 NOTE — ED Notes (Signed)
MD notified and and have not heard back regarding pt wanting to be d/c

## 2022-02-22 NOTE — TOC Benefit Eligibility Note (Signed)
Patient Teacher, English as a foreign language completed.    The patient is currently admitted and upon discharge could be taking Jardiance.  The current 30 day co-pay is $0.00.   The patient is insured through [facility] Medicare Part D

## 2022-02-22 NOTE — Discharge Instructions (Addendum)
[name]  It was a pleasure taking care of you at [facility] were admitted for heart failure exacerbation, you were treated with Lasix.  We are discharging you home now that you are doing better. Please follow the following instructions.   1) Regarding your heart failure, I want you to continue taking your medications as prescribed including your metoprolol 25 mg daily.  I have added a new medication called empagliflozin, I want you to start taking 10 mg daily of this medication.  2) Please follow-up with Dr. Raymondo Band on December 7th, at 10:15 AM for follow-up appointment at the internal medicine clinic.  3) If you do start feeling that you are gaining weight regarding your fluid, please take your torsemide as directed.  4) Regarding your hypertension, I have started you on losartan 25 mg daily for your blood pressure.  Please take this daily.  Take care,  Dr. Leigh Aurora, DO

## 2022-02-22 NOTE — TOC Benefit Eligibility Note (Signed)
Patient Teacher, English as a foreign language completed.    The patient is currently admitted and upon discharge could be taking Losartan.  The current 30 day co-pay is $0.00.   The patient is insured through [place] Patient Advocate Specialist [facility] Patient Advocate Team Direct Number: [phone]  Fax: [phone]

## 2022-02-24 DIAGNOSIS — I509 Heart failure, unspecified: Secondary | ICD-10-CM | POA: Diagnosis not present

## 2022-02-24 DIAGNOSIS — R7309 Other abnormal glucose: Secondary | ICD-10-CM | POA: Diagnosis not present

## 2022-02-24 DIAGNOSIS — Z79899 Other long term (current) drug therapy: Secondary | ICD-10-CM | POA: Diagnosis not present

## 2022-02-24 DIAGNOSIS — G8929 Other chronic pain: Secondary | ICD-10-CM | POA: Diagnosis not present

## 2022-02-24 DIAGNOSIS — E559 Vitamin D deficiency, unspecified: Secondary | ICD-10-CM | POA: Diagnosis not present

## 2022-02-24 DIAGNOSIS — M25562 Pain in left knee: Secondary | ICD-10-CM | POA: Diagnosis not present

## 2022-02-24 DIAGNOSIS — R030 Elevated blood-pressure reading, without diagnosis of hypertension: Secondary | ICD-10-CM | POA: Diagnosis not present

## 2022-02-24 DIAGNOSIS — I4891 Unspecified atrial fibrillation: Secondary | ICD-10-CM | POA: Diagnosis not present

## 2022-02-24 DIAGNOSIS — E1165 Type 2 diabetes mellitus with hyperglycemia: Secondary | ICD-10-CM | POA: Diagnosis not present

## 2022-02-24 DIAGNOSIS — E119 Type 2 diabetes mellitus without complications: Secondary | ICD-10-CM | POA: Diagnosis not present

## 2022-02-24 DIAGNOSIS — M25561 Pain in right knee: Secondary | ICD-10-CM | POA: Diagnosis not present

## 2022-02-24 DIAGNOSIS — R5383 Other fatigue: Secondary | ICD-10-CM | POA: Diagnosis not present

## 2022-02-24 DIAGNOSIS — J439 Emphysema, unspecified: Secondary | ICD-10-CM | POA: Diagnosis not present

## 2022-02-27 ENCOUNTER — Other Ambulatory Visit: Payer: Self-pay | Admitting: Internal Medicine

## 2022-02-27 ENCOUNTER — Telehealth: Payer: Self-pay

## 2022-02-27 NOTE — Patient Outreach (Signed)
  Care Coordination TOC Note Transition Care Management Follow-up Telephone Call Date of discharge and from where: Sharon Vasquez 02/20/22 How have you been since you were released from the hospital? "I am doing better, I need to find a folding wheelchair as we are going on a cruise Mar 17, 2022". Any questions or concerns? No  Items Reviewed: Did the pt receive and understand the discharge instructions provided? Yes  Medications obtained and verified? Yes  Other? No  Any new allergies since your discharge? No  Dietary orders reviewed? Yes Do you have support at home? Yes   Home Care and Equipment/Supplies: Were home health services ordered? no If so, what is the name of the agency? N/A  Has the agency set up a time to come to the patient's home? not applicable Were any new equipment or medical supplies ordered?  No What is the name of the medical supply agency? N/a Were you able to get the supplies/equipment? no Do you have any questions related to the use of the equipment or supplies? No  Functional Questionnaire: (I = Independent and D = Dependent) ADLs: I  Bathing/Dressing- I  Meal Prep- I  Eating- I  Maintaining continence- I  Transferring/Ambulation- I  Managing Meds- I  Follow up appointments reviewed:  PCP Hospital f/u appt confirmed? Yes  Scheduled to see Dr. Raymondo Band on 03/09/22 @ 10:15. [facility] f/u appt confirmed? No   Are transportation arrangements needed? No  If their condition worsens, is the pt aware to call PCP or go to the Emergency Dept.? Yes Was the patient provided with contact information for the PCP's office or ED? Yes Was to pt encouraged to call back with questions or concerns? Yes  SDOH assessments and interventions completed:   Yes  Care Coordination Interventions Activated:  Yes   Care Coordination Interventions:  No Care Coordination interventions needed at this time.   Encounter Outcome:  Pt. Visit Completed

## 2022-02-28 ENCOUNTER — Telehealth: Payer: Self-pay

## 2022-02-28 NOTE — Patient Outreach (Signed)
  Care Coordination   Follow Up Visit Note   02/28/2022 Name: Sharon Vasquez [name]: [id number] DOB: [birth year]  Sharon Vasquez is a 71 y.o. year old female who sees Vasquez, Sharon N, DO for primary care. I spoke with  Sharon Vasquez by phone today.  What matters to the patients health and wellness today?  Patient getting portable wheelchair and she continues to recover from her recent hospital visit/CHF.    Goals Addressed               This Visit's Progress     COMPLETED: "I need to get a wheelchair that folds" (pt-stated)        Care Coordination Interventions: Discussed plans with patient for ongoing care management follow up and provided patient with direct contact information for care management team Discussed patient need for a transport wheelchair and if she was able to find one.  This Probation officer investigated some online and they had a weight limit of 275 lbs.  Patient noted her daughter was able to locate and purchase one in [place].        COMPLETED: I need to manage my Heart Failure Medications (pt-stated)        Care Coordination Interventions: Educated patient on importance of medication compliance to prevent CHF exacerbations.  Collaborated with PCP on medications              SDOH assessments and interventions completed:  Yes  SDOH Interventions Today    Flowsheet Row Most Recent Value  SDOH Interventions   Food Insecurity Interventions Intervention Not Indicated  Housing Interventions Intervention Not Indicated  Transportation Interventions Intervention Not Indicated        Care Coordination Interventions:  Yes, provided   Follow up plan: No further intervention required.   Encounter Outcome:  Pt. Visit Completed

## 2022-03-01 ENCOUNTER — Ambulatory Visit: Payer: Medicare Other | Admitting: Podiatry

## 2022-03-02 DIAGNOSIS — Z79899 Other long term (current) drug therapy: Secondary | ICD-10-CM | POA: Diagnosis not present

## 2022-03-03 ENCOUNTER — Ambulatory Visit
Admission: RE | Admit: 2022-03-03 | Discharge: 2022-03-03 | Disposition: A | Discharge status: Home / Self Care | Payer: Medicare Other | Source: Ambulatory Visit | Attending: Adult Health | Admitting: Adult Health

## 2022-03-03 DIAGNOSIS — Z853 Personal history of malignant neoplasm of breast: Secondary | ICD-10-CM | POA: Diagnosis not present

## 2022-03-03 DIAGNOSIS — Z170 Estrogen receptor positive status [ER+]: Secondary | ICD-10-CM

## 2022-03-09 ENCOUNTER — Ambulatory Visit (INDEPENDENT_AMBULATORY_CARE_PROVIDER_SITE_OTHER): Payer: Medicare Other | Admitting: Internal Medicine

## 2022-03-09 ENCOUNTER — Encounter: Payer: Self-pay | Admitting: Internal Medicine

## 2022-03-09 ENCOUNTER — Other Ambulatory Visit: Payer: Self-pay

## 2022-03-09 VITALS — BP 129/82 | HR 78 | Temp 97.8°F | Resp 28 | Ht 74.0 in | Wt 356.7 lb

## 2022-03-09 DIAGNOSIS — Z87891 Personal history of nicotine dependence: Secondary | ICD-10-CM

## 2022-03-09 DIAGNOSIS — M170 Bilateral primary osteoarthritis of knee: Secondary | ICD-10-CM | POA: Diagnosis not present

## 2022-03-09 DIAGNOSIS — Z7985 Long-term (current) use of injectable non-insulin antidiabetic drugs: Secondary | ICD-10-CM

## 2022-03-09 DIAGNOSIS — E119 Type 2 diabetes mellitus without complications: Secondary | ICD-10-CM

## 2022-03-09 DIAGNOSIS — Z7984 Long term (current) use of oral hypoglycemic drugs: Secondary | ICD-10-CM

## 2022-03-09 DIAGNOSIS — Z720 Tobacco use: Secondary | ICD-10-CM

## 2022-03-09 DIAGNOSIS — I5032 Chronic diastolic (congestive) heart failure: Secondary | ICD-10-CM | POA: Diagnosis not present

## 2022-03-09 DIAGNOSIS — R7303 Prediabetes: Secondary | ICD-10-CM

## 2022-03-09 NOTE — Assessment & Plan Note (Signed)
The patient has a significant history of tobacco use disorder, but has been slowly cutting down.  She states that she has not had a cigarette since October 2.  I congratulated the patient on quitting smoking and encouraged her to keep up the great work.

## 2022-03-09 NOTE — Assessment & Plan Note (Signed)
The patient presents for hospital follow-up after being hospitalized from 11/20 - 11/22 for acute on chronic HFpEF.  While she was in the hospital she was diuresed and had 5 L of urine output.  She was discharged with metoprolol 25 mg daily and was restarted on losartan 25 mg daily and torsemide 20 mg bid. She was started on Jardiance 10 mg daily, however she is very upset that we started this medication without discussing it with her.  She states she is urinating very frequently and has been leaking, which is very upsetting to her.  She actually wants to be catheterized prior to going on her cruise, as she is worried that she will be incontinent of her urine while she is there.  I apologized that a new medication was started prior to communicating this with her.  We discussed the benefits of staying on Jardiance for her heart failure, but also agreed that she can hold this medication for 7 days while she is on her cruise.  Plan: -Continue metoprolol 25 mg daily -Continue losartan 25 mg daily -Continue Jardiance 10 mg daily (hold for 7 days during cruise) -Continue torsemide 20 mg twice daily

## 2022-03-09 NOTE — Progress Notes (Signed)
Internal Medicine Clinic Attending ° °Case discussed with Dr. Atway  At the time of the visit.  We reviewed the resident’s history and exam and pertinent patient test results.  I agree with the assessment, diagnosis, and plan of care documented in the resident’s note.  °

## 2022-03-09 NOTE — Assessment & Plan Note (Addendum)
>>  ASSESSMENT AND PLAN FOR PREDIABETES WRITTEN ON 03/09/2022 11:33 AM BY Daritza Brees N, DO  A1c was checked at the patient's pain management clinic last week and was 6.0%.  Upon chart review, the patient has never had an A1c greater than 6.4%, thus she has prediabetes.  She is on Ozempic  0.5 mg/week.  Plan: -Continue Ozempic  0.5 mg/week] -Continue Jardiance  10 mg daily -Urine micro today   >>ASSESSMENT AND PLAN FOR DIABETES (HCC) WRITTEN ON 03/09/2022 11:32 AM BY Markiyah Gahm N, DO  A1c was checked at the patient's pain management clinic last week and was 6.0%.  Upon chart review, the patient has never had an A1c greater than 6.4%, thus she has prediabetes.  She is on Ozempic  0.5 mg/week.  Plan: -Continue Ozempic  0.5 mg/week] -Continue Jardiance  10 mg daily -Urine micro today

## 2022-03-09 NOTE — Assessment & Plan Note (Signed)
Patient has a history of severe, bilateral knee osteoarthritis.  The patient is going on a cruise in the next couple of weeks and she will be on that cruise for a week (going to [place] and the [place]).  The patient wants to take prednisone while she is on the cruise, so she does not have to worry about her knee pain.  I discussed with the patient that we could do a steroid injection, in place of this, and she is agreeable with the plan.  She would like to do the knee injection closer to the time of her cruise, thus we will schedule it next week  Plan: -Knee injections next week

## 2022-03-09 NOTE — Progress Notes (Signed)
CC: hospital folow up  HPI:  [name] is a 71 y.o. female living with a history stated below and presents today for a hospital follow up after being admitted from 11/20-11/22. Please see problem based assessment and plan for additional details.  Past Medical History:  Diagnosis Date   A-fib ([place])    Acute hypoxemic respiratory failure ([facility]) 02/20/2022   Acute on chronic hypoxic respiratory failure ([facility]) 01/04/2022   Acute upper respiratory infection 01/08/2022   Anxiety    Arthritis    "qwhre; joints, back" (04/17/2017)   Atrial fibrillation (HCC)    Benign breast cyst in female, left 01/08/2017   Found by Screening mammogram, evaluated by [facility] on 01/08/17 and determined to be a benign simple breast cyst.   Breast cancer ([facility])    Cellulitis of left lower leg 05/30/2017   CHF (congestive heart failure) (HCC)    Chronic low back pain 08/21/2016   Chronic lower back pain    Chronic venous insufficiency    /notes 05/30/2017   COPD (chronic obstructive pulmonary disease) ([facility])    Depression    Diabetes ([facility]) 01/08/2022   Diabetes mellitus without complication (HCC)    DVT (deep venous thrombosis) ([facility]) 11/16/2016   Dysrhythmia    GERD (gastroesophageal reflux disease)    Headache    "weekly for the last 3 months" (04/17/2017)   Hyperlipidemia    Hypertension    Morbid obesity ([facility])    PE (pulmonary embolism)    Pulmonary embolism ([facility]) 09/21/2014   Sleep apnea     Current Outpatient Medications on File Prior to Visit  Medication Sig Dispense Refill   albuterol (VENTOLIN HFA) 108 (90 Base) MCG/ACT inhaler Inhale 2 puffs into the lungs every 4 (four) hours as needed for shortness of breath. 18 g 0   aspirin 81 MG chewable tablet Chew 81 mg by mouth daily.     atorvastatin (LIPITOR) 80 MG tablet TAKE 1 TABLET BY MOUTH EVERY DAY (Patient taking differently: Take 80 mg by mouth daily.) 90 tablet 3   empagliflozin (JARDIANCE) 10 MG TABS tablet Take 1 tablet (10 mg total)  by mouth daily before breakfast. 90 tablet 0   esomeprazole (NEXIUM) 40 MG capsule Take 1 capsule (40 mg total) by mouth daily as needed (for heartburn or indigestion). (Patient taking differently: Take 40 mg by mouth daily.) 90 capsule 1   gabapentin (NEURONTIN) 400 MG capsule TAKE 1 CAPSULE BY MOUTH  TWICE DAILY (Patient taking differently: Take 400 mg by mouth at bedtime.) 200 capsule 2   ipratropium-albuterol (DUONEB) 0.5-2.5 (3) MG/3ML SOLN Take 3 mLs by nebulization every 6 (six) hours as needed. (Patient taking differently: Take 3 mLs by nebulization every 6 (six) hours as needed (shortness of breath).) 360 mL 3   letrozole (FEMARA) 2.5 MG tablet TAKE 1 TABLET(2.5 MG) BY MOUTH DAILY (Patient taking differently: Take 2.5 mg by mouth daily. TAKE 1 TABLET(2.5 MG) BY MOUTH DAILY) 90 tablet 3   losartan (COZAAR) 25 MG tablet Take 1 tablet (25 mg total) by mouth daily. 30 tablet 11   metoprolol succinate (TOPROL-XL) 25 MG 24 hr tablet TAKE 1 TABLET BY MOUTH DAILY 100 tablet 2   MYRBETRIQ 50 MG TB24 tablet TAKE 1 TABLET BY MOUTH DAILY 100 tablet 2   naloxone (NARCAN) nasal spray 4 mg/0.1 mL Place 1 spray into the nose once as needed. for opioid overdose (Patient not taking: Reported on 01/04/2022)     nicotine (NICOTROL) 10 MG inhaler Inhale  1 continuous puffing into the lungs as needed for smoking cessation.     oxyCODONE (ROXICODONE) 15 MG immediate release tablet Take 15 mg by mouth every 6 (six) hours as needed for pain.     OXYGEN Inhale 2-3 L into the lungs daily.     potassium chloride SA (KLOR-CON M) 20 MEQ tablet TAKE 1 TABLET BY MOUTH DAILY 90 tablet 0   PRESCRIPTION MEDICATION Inhale into the lungs at bedtime. cpap     rivaroxaban (XARELTO) 20 MG TABS tablet Take 1 tablet (20 mg total) by mouth every morning. 90 tablet 3   Semaglutide,0.25 or 0.'5MG'$ /DOS, 2 MG/3ML SOPN Inject 0.5 Units into the skin once a week. (Patient taking differently: Inject 0.5 Units into the skin once a week.  Wednesday) 3 mL 2   torsemide (DEMADEX) 20 MG tablet Take 2 tablets by mouth daily, you may take an extra 1/2 tablet only as needed for weight gain of 2 lbs overnight or 5 lbs in a week 225 tablet 1   TRELEGY ELLIPTA 100-62.5-25 MCG/ACT AEPB INHALE 1 PUFF INTO THE LUNGS DAILY 60 each 5   No current facility-administered medications on file prior to visit.    Family History  Problem Relation Age of Onset   Breast cancer Mother        before age 49   Hypertension Mother    Hyperlipidemia Mother    Hypertension Maternal Grandfather    Hyperlipidemia Maternal Grandfather     Social History   Socioeconomic History   Marital status: Divorced    Spouse name: Not on file   Number of children: Not on file   Years of education: Not on file   Highest education level: Not on file  Occupational History   Not on file  Tobacco Use   Smoking status: Former    Packs/day: 2.00    Years: 55.00    Total pack years: 110.00    Types: Cigarettes    Start date: 08/16/1961    Quit date: 01/02/2022    Years since quitting: 0.1   Smokeless tobacco: Never   Tobacco comments:    Pt smokes about 4 ciggs daily. Stopped 01/02/2022   Vaping Use   Vaping Use: Never used  Substance and Sexual Activity   Alcohol use: No   Drug use: No   Sexual activity: Not Currently    Partners: Male  Other Topics Concern   Not on file  Social History Narrative   Lives in [facility] senior complex in [place]. Lives alone, but is dependent in ADLs/IADLs. 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 Two daughters live nearby. Previously resided in [place].   Social Determinants of Health   Financial Resource Strain: Medium Risk (12/02/2021)   Overall Financial Resource Strain (CARDIA)    Difficulty of Paying Living Expenses: Somewhat hard  Food Insecurity: No Food Insecurity (02/28/2022)   Hunger Vital Sign    Worried About Running Out of Food in the Last Year: Never true    Ran Out of  Food in the Last Year: Never true  Transportation Needs: No Transportation Needs (02/28/2022)   PRAPARE - Hydrologist (Medical): No    Lack of Transportation (Non-Medical): No  Physical Activity: Inactive (12/02/2021)   Exercise Vital Sign    Days of Exercise per Week: 0 days    Minutes of Exercise per Session: 0 min  Stress: No Stress Concern Present (12/02/2021)  [place] Questionnaire    Feeling of Stress : Only a little  Social Connections: Moderately Integrated (12/02/2021)   Social Connection and Isolation Panel [NHANES]    Frequency of Communication with Friends and Family: More than three times a week    Frequency of Social Gatherings with Friends and Family: More than three times a week    Attends Religious Services: More than 4 times per year    Active Member of [employer] or Organizations: Yes    Attends Music therapist: More than 4 times per year    Marital Status: Divorced  Intimate Partner Violence: Not At Risk (01/11/2022)   Humiliation, Afraid, Rape, and Kick questionnaire    Fear of Current or Ex-Partner: No    Emotionally Abused: No    Physically Abused: No    Sexually Abused: No    Review of Systems: ROS negative except for what is noted on the assessment and plan.  Vitals:   03/09/22 1017 03/09/22 1025  BP: (!) 136/90 129/82  Pulse: 84 78  Resp: (!) 28   Temp: 97.8 F (36.6 C)   TempSrc: Oral   SpO2: 97%   Weight: (!) 356 lb 11.2 oz (161.8 kg)   Height: '6\' 2"'$  (1.88 m)     Physical Exam: Constitutional: chronically ill-appearing female sitting in wheelchair, in no acute distress Cardiovascular: regular rate, irregular rhythm Pulmonary/Chest: normal work of breathing on baseline 2 L [place], lungs clear to auscultation bilaterally; no wheezing MSK: normal bulk and tone; trace bilateral lower extremity pitting edema Neurological: alert & oriented x 3, no focal  deficit Skin: warm and dry Psych: normal mood and behavior  Assessment & Plan:   Patient discussed with Dr. Philipp Ovens  (HFpEF) heart failure with preserved ejection fraction [facility]) The patient presents for hospital follow-up after being hospitalized from 11/20 - 11/22 for acute on chronic HFpEF.  While she was in the hospital she was diuresed and had 5 L of urine output.  She was discharged with metoprolol 25 mg daily and was restarted on losartan 25 mg daily and torsemide 20 mg bid. She was started on Jardiance 10 mg daily, however she is very upset that we started this medication without discussing it with her.  She states she is urinating very frequently and has been leaking, which is very upsetting to her.  She actually wants to be catheterized prior to going on her cruise, as she is worried that she will be incontinent of her urine while she is there.  I apologized that a new medication was started prior to communicating this with her.  We discussed the benefits of staying on Jardiance for her heart failure, but also agreed that she can hold this medication for 7 days while she is on her cruise.  Plan: -Continue metoprolol 25 mg daily -Continue losartan 25 mg daily -Continue Jardiance 10 mg daily (hold for 7 days during cruise) -Continue torsemide 20 mg twice daily  Diabetes (HCC) A1c was checked at the patient's pain management clinic last week and was 6.0%.  Upon chart review, the patient has never had an A1c greater than 6.4%, thus she has prediabetes.  She is on Ozempic 0.5 mg/week.  Plan: -Continue Ozempic 0.5 mg/week] -Continue Jardiance 10 mg daily -Urine micro today  Prediabetes A1c was checked at the patient's pain management clinic last week and was 6.0%.  Upon chart review, the patient has never had an A1c greater than 6.4%, thus  she has prediabetes.  She is on Ozempic 0.5 mg/week.  Plan: -Continue Ozempic 0.5 mg/week] -Continue Jardiance 10 mg daily -Urine micro  today  Tobacco use The patient has a significant history of tobacco use disorder, but has been slowly cutting down.  She states that she has not had a cigarette since October 2.  I congratulated the patient on quitting smoking and encouraged her to keep up the great work.  Primary osteoarthritis of both knees Patient has a history of severe, bilateral knee osteoarthritis.  The patient is going on a cruise in the next couple of weeks and she will be on that cruise for a week (going to [place] and the [place]).  The patient wants to take prednisone while she is on the cruise, so she does not have to worry about her knee pain.  I discussed with the patient that we could do a steroid injection, in place of this, and she is agreeable with the plan.  She would like to do the knee injection closer to the time of her cruise, thus we will schedule it next week  Plan: -Knee injections next week   Sharon Vasquez, D.O. [facility], PGY-2 Phone: [phone] Date 03/09/2022 Time 11:35 AM

## 2022-03-09 NOTE — Patient Instructions (Signed)
Thank you, Sharon Vasquez for allowing [place] to provide your care today. Today we discussed:  Heart/blood pressure: Keep taking metoprolol 25 mg daily and losartan 25 mg daily. Please also take jardiance 10 mg daily, but you can STOP this medicine while you are on your cruise. Continue to use your torsemide, as well.   Knee: We can do knee injections next week for you  I have ordered the following labs for you:  Lab Orders  No laboratory test(s) ordered today     Referrals ordered today:   Referral Orders  No referral(s) requested today     I have ordered the following medication/changed the following medications:   Stop the following medications: There are no discontinued medications.   Start the following medications: No orders of the defined types were placed in this encounter.    Follow up:  1 week for knee injection     Should you have any questions or concerns please call the internal medicine clinic at [phone].     Buddy Duty, D.O. [place]

## 2022-03-09 NOTE — Assessment & Plan Note (Signed)
A1c was checked at the patient's pain management clinic last week and was 6.0%.  Upon chart review, the patient has never had an A1c greater than 6.4%, thus she has prediabetes.  She is on Ozempic 0.5 mg/week.  Plan: -Continue Ozempic 0.5 mg/week] -Continue Jardiance 10 mg daily -Urine micro today

## 2022-03-10 LAB — MICROALBUMIN / CREATININE URINE RATIO
Creatinine, Urine: 184.7 mg/dL
Microalb/Creat Ratio: 143 mg/g creat — ABNORMAL HIGH (ref 0–29)
Microalbumin, Urine: 263.3 ug/mL

## 2022-03-12 DIAGNOSIS — G4733 Obstructive sleep apnea (adult) (pediatric): Secondary | ICD-10-CM | POA: Diagnosis not present

## 2022-03-12 DIAGNOSIS — M170 Bilateral primary osteoarthritis of knee: Secondary | ICD-10-CM | POA: Diagnosis not present

## 2022-03-13 NOTE — Progress Notes (Signed)
Microalbumin/creatinine ratio elevated to 143. Patient is already on ARB (losartan) and was recently started on SGLT2i (Jardiance), however, she has not been taking the Jardiance regularly, as she does not like that it makes her urinate frequently. Attempted to call patient regarding result, and to emphasize importance of taking Jardiance, however, I was unable to reach her. Will discuss this with her at her next appt in a few days.

## 2022-03-15 ENCOUNTER — Other Ambulatory Visit: Payer: Self-pay

## 2022-03-15 ENCOUNTER — Ambulatory Visit (INDEPENDENT_AMBULATORY_CARE_PROVIDER_SITE_OTHER): Payer: Medicare Other | Admitting: Student

## 2022-03-15 ENCOUNTER — Encounter: Payer: Self-pay | Admitting: Student

## 2022-03-15 VITALS — BP 132/85 | HR 81 | Temp 98.5°F | Ht 74.0 in | Wt 350.0 lb

## 2022-03-15 DIAGNOSIS — F410 Panic disorder [episodic paroxysmal anxiety] without agoraphobia: Secondary | ICD-10-CM | POA: Diagnosis not present

## 2022-03-15 DIAGNOSIS — M170 Bilateral primary osteoarthritis of knee: Secondary | ICD-10-CM

## 2022-03-15 DIAGNOSIS — J439 Emphysema, unspecified: Secondary | ICD-10-CM

## 2022-03-15 MED ORDER — LORAZEPAM 0.5 MG PO TABS: 0.5000 mg | ORAL_TABLET | Freq: Three times a day (TID) | ORAL | 0 refills | Status: DC | PRN

## 2022-03-15 MED ORDER — PREDNISONE 20 MG PO TABS: 20.0000 mg | ORAL_TABLET | Freq: Every day | ORAL | 0 refills | Status: DC

## 2022-03-15 NOTE — Progress Notes (Signed)
CC: Chronic knee pain  HPI:  Sharon Vasquez is a 71 y.o. female with PMH as below who presents to the clinic for reevaluation of chronic knee pain and possible knee injection.  Please see encounters tab for problem-based charting.  Past Medical History:  Diagnosis Date   A-fib ([place])    Acute hypoxemic respiratory failure ([place]) 02/20/2022   Acute on chronic hypoxic respiratory failure ([place]) 01/04/2022   Acute upper respiratory infection 01/08/2022   Anxiety    Arthritis    "qwhre; joints, back" (04/17/2017)   Atrial fibrillation (HCC)    Benign breast cyst in female, left 01/08/2017   Found by Screening mammogram, evaluated by [facility] on 01/08/17 and determined to be a benign simple breast cyst.   Breast cancer ([place])    Cellulitis of left lower leg 05/30/2017   CHF (congestive heart failure) (HCC)    Chronic low back pain 08/21/2016   Chronic lower back pain    Chronic venous insufficiency    /notes 05/30/2017   COPD (chronic obstructive pulmonary disease) ([place])    Depression    Diabetes ([place]) 01/08/2022   Diabetes mellitus without complication (HCC)    DVT (deep venous thrombosis) ([place]) 11/16/2016   Dysrhythmia    GERD (gastroesophageal reflux disease)    Headache    "weekly for the last 3 months" (04/17/2017)   Hyperlipidemia    Hypertension    Morbid obesity ([place])    PE (pulmonary embolism)    Pulmonary embolism ([place]) 09/21/2014   Sleep apnea    Review of Systems:   Positive for chronic bilateral knee pain, intermittent anxiety attacks, shortness of breath controlled by supplemental oxygen. Negative for fever, worsening cough, chest pain.  Physical Exam:  Vitals:   03/15/22 0902  BP: 132/85  Pulse: 81  Temp: 98.5 F (36.9 C)  TempSrc: Oral  SpO2: 93%  Weight: (!) 350 lb (158.8 kg)  Height: '6\' 2"'$  (1.88 m)    Constitutional: Morbidly obese elderly female. In no acute distress. Eyes: Sclera non-icteric, EOM intact Cardio:Regular rate and rhythm.  Pulm:  Decreased breath sounds throughout lung fields, limited by body habitus. MSK: Morbidly obese patient with significant lipedema of her legs bilaterally.  Tenderness to palpation around the knee predominantly on the right with limited range of motion secondary to pain.  No warmth or erythema of either knee joint. Skin:Warm and dry. Neuro:Alert and oriented x3. No focal deficit noted. Psych:Pleasant mood and affect.   Assessment & Plan:   Primary osteoarthritis of both knees Patient presents today for a knee injection of both knees but today states that the knee injections that she usually gets that her orthopedist only last a couple days and that oral steroids have worked better in the past.  We discussed the findings of her most recent knee imaging that shows severe osteoarthritis with bone-on-bone contact and that steroids by injection or orally will likely not have much significant benefit.  We discussed her prior evaluation for surgical invention and that she is not a surgical candidate due to her multiple comorbidities.  We will prescribe her some prednisone for her symptoms as she is going on a cruise tomorrow.  Plan to discuss more palliative measures going forward as her options for pain control or currently limited. - Prednisone 20 mg daily for 7 days  Anxiety attack Patient states that she has recently been having multiple anxiety attacks that are associated with tachypnea and some shortness of breath.  She is on 2  L of supplemental oxygen at baseline and will turn up the rate to 3 to 4 L during these episodes.  She does not know any particular triggers and is requesting short acting medication for her symptoms since she is going on a cruise tomorrow.  We discussed that our options for symptom control carry side effects that with her respiratory disease could be dangerous and that she would need good supervision when taking this medication.  She has her daughter in the room and they agree to  watch for any signs of respiratory depression or other side effects.  We also discussed that this medication would be a Band-Aid and we would have to discuss different medications or more long-acting medications in the future as a fairly palliative measure.  She currently has a prescription for oxycodone 15 mg every 6 hours as needed for pain which she gets from another physician and fills consistently according to the PDMP. - Ativan 0.5 mg every 8 hours as needed, 5 tablets prescribed    Patient seen with Dr. Willia Craze, [facility] Resident PGY-1 Pager: [phone]

## 2022-03-15 NOTE — Patient Instructions (Signed)
  Thank you, [name], for allowing [place] to provide your care today. Today we discussed . . .  > Knee Pain       -We are going to prescribe you prednisone 20 mg daily for 7 days to help get you through your cruise. > Anxiety Attacks       -We will prescribe you 5 tablets of Ativan 0.5 mg which you may take for anxiety attacks.  Please space them out by at least 8 hours.  As we discussed this will not be a good long-term treatment for anxiety or anxiety attacks and we will need to discuss it further at your next visit.   I have ordered the following labs for you:  Lab Orders  No laboratory test(s) ordered today      Tests ordered today:  None   Referrals ordered today:   Referral Orders  No referral(s) requested today      I have ordered the following medication/changed the following medications:   Stop the following medications: There are no discontinued medications.   Start the following medications: Meds ordered this encounter  Medications   predniSONE (DELTASONE) 20 MG tablet    Sig: Take 1 tablet (20 mg total) by mouth daily with breakfast.    Dispense:  7 tablet    Refill:  0   LORazepam (ATIVAN) 0.5 MG tablet    Sig: Take 1 tablet (0.5 mg total) by mouth every 8 (eight) hours as needed for anxiety.    Dispense:  5 tablet    Refill:  0      Follow up:  4-6 weeks     Remember:     Should you have any questions or concerns please call the internal medicine clinic at [phone].     Sharon Vasquez, [place]

## 2022-03-17 ENCOUNTER — Ambulatory Visit: Payer: Self-pay | Admitting: Licensed Clinical Social Worker

## 2022-03-17 DIAGNOSIS — F410 Panic disorder [episodic paroxysmal anxiety] without agoraphobia: Secondary | ICD-10-CM | POA: Insufficient documentation

## 2022-03-17 NOTE — Progress Notes (Signed)
Internal Medicine Clinic Attending  I saw and evaluated the patient.  I personally confirmed the key portions of the history and exam documented by Dr. Nikki Dom and I reviewed pertinent patient test results.  The assessment, diagnosis, and plan were formulated together and I agree with the documentation in the resident's note.   Difficult situation of a 71 year old person with multiple severe comorbidities, high fragility, recent hospitalizations with knee pain due to severe osteoarthritis and not a candidate for surgery. Despite all her medical issues and symptoms, she has set her mind on a one week cruise later this month. I warned her that I do not think she is healthy enough for travel, but she tells me that she is going anyways. She wants to better control her symptoms while on the cruise, mostly her knee pain, dyspnea, and anxiety. We had a discussion focusing on the quality of her life going forward, rather than quantity. She has plans to bring her portable oxygen and cpap on the cruise. She will be with family who will help with her medications and equipment. We decided to use a short course of prednisone which I think will help with her COPD control and knee pain. We will also use low dose ativan only if needed for anxiety, panic, air hunger, or fear of flying. We told her about the risks of using ativan while also using oxycodone, and I warned her not to mix these medicines with alcohol. I hope she has a meaningful trip, she is taking on significant risk but she seems resolved in going on this journey. When she returns, it would be a good time to refocus our outpatient care on palliative or hospice goals.

## 2022-03-17 NOTE — Assessment & Plan Note (Signed)
Patient presents today for a knee injection of both knees but today states that the knee injections that she usually gets that her orthopedist only last a couple days and that oral steroids have worked better in the past.  We discussed the findings of her most recent knee imaging that shows severe osteoarthritis with bone-on-bone contact and that steroids by injection or orally will likely not have much significant benefit.  We discussed her prior evaluation for surgical invention and that she is not a surgical candidate due to her multiple comorbidities.  We will prescribe her some prednisone for her symptoms as she is going on a cruise tomorrow.  Plan to discuss more palliative measures going forward as her options for pain control or currently limited. - Prednisone 20 mg daily for 7 days

## 2022-03-17 NOTE — Patient Outreach (Signed)
SW removed from care team.  Hulan Szumski, BSW , MSW, LCSW-A Social Worker [facility]  [phone]   

## 2022-03-17 NOTE — Assessment & Plan Note (Addendum)
Patient states that she has recently been having multiple anxiety attacks that are associated with tachypnea and some shortness of breath.  She is on 2 L of supplemental oxygen at baseline and will turn up the rate to 3 to 4 L during these episodes.  She does not know any particular triggers and is requesting short acting medication for her symptoms since she is going on a cruise tomorrow.  We discussed that our options for symptom control carry side effects that with her respiratory disease could be dangerous and that she would need good supervision when taking this medication.  She has her daughter in the room and they agree to watch for any signs of respiratory depression or other side effects.  We also discussed that this medication would be a Band-Aid and we would have to discuss different medications or more long-acting medications in the future as a fairly palliative measure.  She currently has a prescription for oxycodone 15 mg every 6 hours as needed for pain which she gets from another physician and fills consistently according to the PDMP. - Ativan 0.5 mg every 8 hours as needed, 5 tablets prescribed

## 2022-03-19 DIAGNOSIS — J449 Chronic obstructive pulmonary disease, unspecified: Secondary | ICD-10-CM | POA: Diagnosis not present

## 2022-03-28 ENCOUNTER — Emergency Department (HOSPITAL_COMMUNITY)
Admission: EM | Admit: 2022-03-28 | Discharge: 2022-03-28 | Disposition: A | Discharge status: Home / Self Care | Payer: Medicare Other | Attending: Emergency Medicine | Admitting: Emergency Medicine

## 2022-03-28 ENCOUNTER — Emergency Department (HOSPITAL_COMMUNITY): Payer: Medicare Other

## 2022-03-28 ENCOUNTER — Other Ambulatory Visit: Payer: Self-pay

## 2022-03-28 DIAGNOSIS — N390 Urinary tract infection, site not specified: Secondary | ICD-10-CM | POA: Diagnosis not present

## 2022-03-28 DIAGNOSIS — R0602 Shortness of breath: Secondary | ICD-10-CM | POA: Diagnosis not present

## 2022-03-28 DIAGNOSIS — Z7982 Long term (current) use of aspirin: Secondary | ICD-10-CM | POA: Diagnosis not present

## 2022-03-28 DIAGNOSIS — Z743 Need for continuous supervision: Secondary | ICD-10-CM | POA: Diagnosis not present

## 2022-03-28 DIAGNOSIS — Z20822 Contact with and (suspected) exposure to covid-19: Secondary | ICD-10-CM | POA: Diagnosis not present

## 2022-03-28 DIAGNOSIS — I110 Hypertensive heart disease with heart failure: Secondary | ICD-10-CM | POA: Insufficient documentation

## 2022-03-28 DIAGNOSIS — I509 Heart failure, unspecified: Secondary | ICD-10-CM

## 2022-03-28 DIAGNOSIS — Z79899 Other long term (current) drug therapy: Secondary | ICD-10-CM | POA: Insufficient documentation

## 2022-03-28 DIAGNOSIS — I10 Essential (primary) hypertension: Secondary | ICD-10-CM | POA: Diagnosis not present

## 2022-03-28 DIAGNOSIS — Z7984 Long term (current) use of oral hypoglycemic drugs: Secondary | ICD-10-CM | POA: Insufficient documentation

## 2022-03-28 DIAGNOSIS — R6889 Other general symptoms and signs: Secondary | ICD-10-CM | POA: Diagnosis not present

## 2022-03-28 DIAGNOSIS — Z7901 Long term (current) use of anticoagulants: Secondary | ICD-10-CM | POA: Insufficient documentation

## 2022-03-28 DIAGNOSIS — I5030 Unspecified diastolic (congestive) heart failure: Secondary | ICD-10-CM | POA: Diagnosis not present

## 2022-03-28 DIAGNOSIS — R7989 Other specified abnormal findings of blood chemistry: Secondary | ICD-10-CM | POA: Insufficient documentation

## 2022-03-28 DIAGNOSIS — I272 Pulmonary hypertension, unspecified: Secondary | ICD-10-CM | POA: Diagnosis not present

## 2022-03-28 DIAGNOSIS — J441 Chronic obstructive pulmonary disease with (acute) exacerbation: Secondary | ICD-10-CM | POA: Insufficient documentation

## 2022-03-28 DIAGNOSIS — R0989 Other specified symptoms and signs involving the circulatory and respiratory systems: Secondary | ICD-10-CM | POA: Diagnosis not present

## 2022-03-28 LAB — URINALYSIS, ROUTINE W REFLEX MICROSCOPIC
Bilirubin Urine: NEGATIVE
Glucose, UA: NEGATIVE mg/dL
Hgb urine dipstick: NEGATIVE
Ketones, ur: NEGATIVE mg/dL
Nitrite: POSITIVE — AB
Protein, ur: NEGATIVE mg/dL
Specific Gravity, Urine: 1.009 (ref 1.005–1.030)
pH: 5 (ref 5.0–8.0)

## 2022-03-28 LAB — COMPREHENSIVE METABOLIC PANEL
ALT: 19 U/L (ref 0–44)
AST: 13 U/L — ABNORMAL LOW (ref 15–41)
Albumin: 3.3 g/dL — ABNORMAL LOW (ref 3.5–5.0)
Alkaline Phosphatase: 78 U/L (ref 38–126)
Anion gap: 7 (ref 5–15)
BUN: 10 mg/dL (ref 8–23)
CO2: 25 mmol/L (ref 22–32)
Calcium: 8.9 mg/dL (ref 8.9–10.3)
Chloride: 111 mmol/L (ref 98–111)
Creatinine, Ser: 0.94 mg/dL (ref 0.44–1.00)
GFR, Estimated: >60 mL/min (ref >60)
Glucose, Bld: 112 mg/dL — ABNORMAL HIGH (ref 70–99)
Potassium: 3.6 mmol/L (ref 3.5–5.1)
Sodium: 143 mmol/L (ref 135–145)
Total Bilirubin: 1 mg/dL (ref 0.3–1.2)
Total Protein: 6.9 g/dL (ref 6.5–8.1)

## 2022-03-28 LAB — CBC WITH DIFFERENTIAL/PLATELET
Abs Immature Granulocytes: 0.01 10*3/uL (ref 0.00–0.07)
Basophils Absolute: 0 10*3/uL (ref 0.0–0.1)
Basophils Relative: 0 %
Eosinophils Absolute: 0.1 10*3/uL (ref 0.0–0.5)
Eosinophils Relative: 1 %
HCT: 34.2 % — ABNORMAL LOW (ref 36.0–46.0)
Hemoglobin: 11.5 g/dL — ABNORMAL LOW (ref 12.0–15.0)
Immature Granulocytes: 0 %
Lymphocytes Relative: 36 %
Lymphs Abs: 2.4 10*3/uL (ref 0.7–4.0)
MCH: 28.8 pg (ref 26.0–34.0)
MCHC: 33.6 g/dL (ref 30.0–36.0)
MCV: 85.7 fL (ref 80.0–100.0)
Monocytes Absolute: 0.7 10*3/uL (ref 0.1–1.0)
Monocytes Relative: 10 %
Neutro Abs: 3.7 10*3/uL (ref 1.7–7.7)
Neutrophils Relative %: 53 %
Platelets: 400 10*3/uL (ref 150–400)
RBC: 3.99 MIL/uL (ref 3.87–5.11)
RDW: 14.6 % (ref 11.5–15.5)
WBC: 6.9 10*3/uL (ref 4.0–10.5)
nRBC: 0 % (ref 0.0–0.2)

## 2022-03-28 LAB — RESP PANEL BY RT-PCR (RSV, FLU A&B, COVID)  RVPGX2
Influenza A by PCR: NEGATIVE
Influenza B by PCR: NEGATIVE
Resp Syncytial Virus by PCR: NEGATIVE
SARS Coronavirus 2 by RT PCR: NEGATIVE

## 2022-03-28 LAB — BRAIN NATRIURETIC PEPTIDE: B Natriuretic Peptide: 193.9 pg/mL — ABNORMAL HIGH (ref 0.0–100.0)

## 2022-03-28 LAB — TROPONIN I (HIGH SENSITIVITY)
Troponin I (High Sensitivity): 5 ng/L (ref <18)
Troponin I (High Sensitivity): 4 ng/L (ref <18)

## 2022-03-28 MED ORDER — DOXYCYCLINE HYCLATE 100 MG PO CAPS: 100.0000 mg | ORAL_CAPSULE | Freq: Two times a day (BID) | ORAL | 0 refills | Status: AC

## 2022-03-28 MED ORDER — FUROSEMIDE 10 MG/ML IJ SOLN
40.0000 mg | Freq: Once | INTRAMUSCULAR | Status: AC
  Administered 2022-03-28: 40 mg via INTRAVENOUS
  Filled 2022-03-28: qty 4

## 2022-03-28 MED ORDER — DOXYCYCLINE HYCLATE 100 MG PO TABS
100.0000 mg | ORAL_TABLET | Freq: Once | ORAL | Status: AC
  Administered 2022-03-28: 100 mg via ORAL
  Filled 2022-03-28: qty 1

## 2022-03-28 MED ORDER — METOPROLOL SUCCINATE ER 25 MG PO TB24
25.0000 mg | ORAL_TABLET | Freq: Once | ORAL | Status: AC
  Administered 2022-03-28: 25 mg via ORAL
  Filled 2022-03-28: qty 1

## 2022-03-28 MED ORDER — CEPHALEXIN 500 MG PO CAPS: 500.0000 mg | ORAL_CAPSULE | Freq: Two times a day (BID) | ORAL | 0 refills | Status: AC

## 2022-03-28 MED ORDER — LOSARTAN POTASSIUM 50 MG PO TABS
25.0000 mg | ORAL_TABLET | Freq: Once | ORAL | Status: AC
  Administered 2022-03-28: 25 mg via ORAL
  Filled 2022-03-28: qty 1

## 2022-03-28 MED ORDER — IPRATROPIUM-ALBUTEROL 0.5-2.5 (3) MG/3ML IN SOLN
6.0000 mL | Freq: Once | RESPIRATORY_TRACT | Status: AC
  Administered 2022-03-28: 6 mL via RESPIRATORY_TRACT
  Filled 2022-03-28: qty 3

## 2022-03-28 MED ORDER — SODIUM CHLORIDE 0.9 % IV SOLN
1.0000 g | Freq: Once | INTRAVENOUS | Status: AC
  Administered 2022-03-28: 1 g via INTRAVENOUS
  Filled 2022-03-28: qty 10

## 2022-03-28 MED ORDER — PREDNISONE 20 MG PO TABS: 20.0000 mg | ORAL_TABLET | Freq: Every day | ORAL | 0 refills | Status: AC

## 2022-03-28 NOTE — ED Triage Notes (Signed)
Patient arrived with EMS from home reports SOB with wheezing rales and crackles this morning , she received 2 doses of Duoneb treatment , Solumedrol 125 mg IV and 1 gram of Magnesium IV ( IV dislodged en route) .

## 2022-03-28 NOTE — ED Provider Notes (Signed)
[facility] EMERGENCY DEPARTMENT Provider Note   CSN: [id number] Arrival date & time: 03/28/22  2376     History  Chief Complaint  Patient presents with   Shortness of Breath    Sharon Vasquez is a 71 y.o. female.  With PMH of COPD on 2 L nasal cannula, HFpEF, history of A-fib and PE on Xarelto presenting with shortness of breath.  EMS provided 2 doses of DuoNeb, Solu-Medrol 125 mg and 1 g IV magnesium.  Patient said she just returned from a cruise yesterday.  Over the past 3 days she has developed a cough that is productive of yellow sputum and she is coughing more than usual.  She has had associated shortness of breath but no chest pain.  No hemoptysis and she is compliant with her Xarelto.  However since being gone on her cruise she did not take her diuretic often or her Vania Rea because she does not like how often she has to pee and get up and move around.  She does note may be some increased swelling in her lower extremities but nothing significant or change from her baseline.  She did get DuoNebs and steroids with EMS and did have some improvement with symptoms since.  She has had no fevers at home.  No vomiting, no diarrhea.  She is also complaining of new foul-smelling urine.  She says no pain or burning with urine but foul-smelling.  She was just on a course of steroids prescribed by her PCP to take during her cruise.   Shortness of Breath      Home Medications Prior to Admission medications   Medication Sig Start Date End Date Taking? Authorizing Provider  albuterol (VENTOLIN HFA) 108 (90 Base) MCG/ACT inhaler Inhale 2 puffs into the lungs every 4 (four) hours as needed for shortness of breath. 08/25/21  Yes Timothy Lasso, MD  aspirin EC 81 MG tablet Take 81 mg by mouth daily. 12/15/20  Yes [provider]  atorvastatin (LIPITOR) 80 MG tablet TAKE 1 TABLET BY MOUTH EVERY DAY Patient taking differently: Take 80 mg by mouth daily. 12/02/21  Yes Atway,  Rayann N, DO  cephALEXin (KEFLEX) 500 MG capsule Take 1 capsule (500 mg total) by mouth 2 (two) times daily for 7 days. 03/28/22 04/04/22 Yes Elgie Congo, MD  doxycycline (VIBRAMYCIN) 100 MG capsule Take 1 capsule (100 mg total) by mouth 2 (two) times daily for 7 days. 03/28/22 04/04/22 Yes Elgie Congo, MD  empagliflozin (JARDIANCE) 10 MG TABS tablet Take 1 tablet (10 mg total) by mouth daily before breakfast. 02/22/22 05/23/22 Yes Patel, Amar, DO  esomeprazole (NEXIUM) 40 MG capsule Take 1 capsule (40 mg total) by mouth daily as needed (for heartburn or indigestion). Patient taking differently: Take 40 mg by mouth daily. 01/20/19  Yes Marcelyn Bruins, MD  gabapentin (NEURONTIN) 400 MG capsule TAKE 1 CAPSULE BY MOUTH  TWICE DAILY Patient taking differently: Take 400 mg by mouth at bedtime. 10/05/21  Yes Atway, Rayann N, DO  ibuprofen (ADVIL) 200 MG tablet Take 800 mg by mouth daily as needed for moderate pain.   Yes [provider]  ipratropium-albuterol (DUONEB) 0.5-2.5 (3) MG/3ML SOLN Take 3 mLs by nebulization every 6 (six) hours as needed. Patient taking differently: Take 3 mLs by nebulization every 6 (six) hours as needed (shortness of breath). 06/18/20  Yes Martyn Ehrich, NP  letrozole ([place]) 2.5 MG tablet TAKE 1 TABLET(2.5 MG) BY MOUTH DAILY Patient taking differently:  Take 2.5 mg by mouth daily. 11/28/21  Yes Nicholas Lose, MD  LORazepam (ATIVAN) 0.5 MG tablet Take 1 tablet (0.5 mg total) by mouth every 8 (eight) hours as needed for anxiety. 03/15/22  Yes Johny Blamer, DO  losartan (COZAAR) 25 MG tablet Take 1 tablet (25 mg total) by mouth daily. 02/22/22 02/22/23 Yes Patel, Hyman Hopes, DO  metoprolol succinate (TOPROL-XL) 25 MG 24 hr tablet TAKE 1 TABLET BY MOUTH DAILY 02/09/22  Yes Virl Axe, MD  MYRBETRIQ 50 MG TB24 tablet TAKE 1 TABLET BY MOUTH DAILY 01/25/22  Yes Gaylan Gerold, DO  naloxone [facility]) nasal spray 4 mg/0.1 mL Place 1 spray into the nose once as  needed (for overdose suspected). for opioid overdose 11/02/21  Yes [provider]  nicotine (NICOTROL) 10 MG inhaler Inhale 1 continuous puffing into the lungs as needed for smoking cessation.   Yes [provider]  oxyCODONE (ROXICODONE) 15 MG immediate release tablet Take 15 mg by mouth every 6 (six) hours as needed for pain. 08/05/20  Yes [provider]  potassium chloride SA (KLOR-CON M) 20 MEQ tablet TAKE 1 TABLET BY MOUTH DAILY 12/29/21  Yes Atway, Rayann N, DO  predniSONE (DELTASONE) 20 MG tablet Take 1 tablet (20 mg total) by mouth daily for 4 days. 03/29/22 04/02/22 Yes Elgie Congo, MD  rivaroxaban (XARELTO) 20 MG TABS tablet Take 1 tablet (20 mg total) by mouth every morning. 02/13/22  Yes Virl Axe, MD  Semaglutide,0.25 or 0.'5MG'$ /DOS, 2 MG/3ML SOPN Inject 0.5 Units into the skin once a week. Patient taking differently: Inject 0.5 Units into the skin every Thursday. 12/02/21  Yes Masters, Katie, DO  torsemide (DEMADEX) 20 MG tablet Take 2 tablets by mouth daily, you may take an extra 1/2 tablet only as needed for weight gain of 2 lbs overnight or 5 lbs in a week Patient taking differently: Take 20 mg by mouth daily as needed (for legs fluid). 12/02/21  Yes Masters, Katie, DO  TRELEGY ELLIPTA 100-62.5-25 MCG/ACT AEPB INHALE 1 PUFF INTO THE LUNGS DAILY 11/16/21  Yes Rigoberto Noel, MD  OXYGEN Inhale 2-3 L into the lungs daily.    [provider]  predniSONE (DELTASONE) 20 MG tablet Take 1 tablet (20 mg total) by mouth daily with breakfast. Patient not taking: Reported on 03/28/2022 03/15/22   Johny Blamer, DO  PRESCRIPTION MEDICATION Inhale into the lungs at bedtime. cpap    [provider]      Allergies    Patient has no known allergies.    Review of Systems   Review of Systems  Respiratory:  Positive for shortness of breath.     Physical Exam Updated Vital Signs BP (!) 145/100   Pulse 79   Temp 98.4 F (36.9 C) (Oral)    Resp 19   SpO2 95%  Physical Exam Constitutional: Alert and oriented.  Chronically ill-appearing but no acute distress sitting up in stretcher bed speaking full sentences eyes: Conjunctivae are normal. ENT      Neck: No stridor. Cardiovascular: S1, S2, irregularly irregular rhythm, regular rate.Warm and well perfused. Respiratory: Normal respiratory effort.  Bilateral end expiratory wheezing but good aeration throughout, no retractions or accessory muscle use, O2 sat ranging from 95 to 99% on home 2 L nasal cannula Gastrointestinal: Soft and nontender.  Musculoskeletal: Normal range of motion in all extremities. Equal 2+ pitting edema extending from foot to knee bilaterally with chronic venous stasis changes, no acute erythema or warmth or discharge Neurologic: Normal  speech and language.  Moving all extremities equally.  Sensation grossly intact.  No gross focal neurologic deficits are appreciated. Skin: Skin is warm, dry and intact. No rash noted. Psychiatric: Mood and affect are normal. Speech and behavior are normal.  ED Results / Procedures / Treatments   Labs (all labs ordered are listed, but only abnormal results are displayed) Labs Reviewed  CBC WITH DIFFERENTIAL/PLATELET - Abnormal; Notable for the following components:      Result Value   Hemoglobin 11.5 (*)    HCT 34.2 (*)    All other components within normal limits  COMPREHENSIVE METABOLIC PANEL - Abnormal; Notable for the following components:   Glucose, Bld 112 (*)    Albumin 3.3 (*)    AST 13 (*)    All other components within normal limits  BRAIN NATRIURETIC PEPTIDE - Abnormal; Notable for the following components:   B Natriuretic Peptide 193.9 (*)    All other components within normal limits  URINALYSIS, ROUTINE W REFLEX MICROSCOPIC - Abnormal; Notable for the following components:   Nitrite POSITIVE (*)    Leukocytes,Ua TRACE (*)    Bacteria, UA MANY (*)    All other components within normal limits  RESP PANEL  BY RT-PCR (RSV, FLU A&B, COVID)  RVPGX2  TROPONIN I (HIGH SENSITIVITY)  TROPONIN I (HIGH SENSITIVITY)    EKG EKG Interpretation  Date/Time:  Tuesday March 28 2022 06:31:51 EST Ventricular Rate:  86 PR Interval:    QRS Duration: 102 QT Interval:  362 QTC Calculation: 433 R Axis:   -25 Text Interpretation: Atrial fibrillation with a competing junctional pacemaker Nonspecific T wave abnormality Abnormal ECG When compared with ECG of 21-Feb-2022 06:46, PREVIOUS ECG IS PRESENT No significant change since last tracing Confirmed by Georgina Snell [phone]) on 03/28/2022 11:52:30 AM  Radiology DG Chest 2 View  Result Date: 03/28/2022 CLINICAL DATA:  Shortness of breath.  Crackles. EXAM: CHEST - 2 VIEW COMPARISON:  02/20/2022 and multiple previous FINDINGS: Pulmonary venous hypertension with mild interstitial edema. No measurable effusion. Mild cardiomegaly. No consolidation or collapse. IMPRESSION: Pulmonary venous hypertension with mild interstitial edema. Consistent with early congestive heart failure. Electronically Signed   By: Nelson Chimes M.D.   On: 03/28/2022 08:01    Procedures Procedures  Remain on constant cardiac monitoring show I personally reviewed, A-fib with normal rates.  Medications Ordered in ED Medications  ipratropium-albuterol (DUONEB) 0.5-2.5 (3) MG/3ML nebulizer solution 6 mL (6 mLs Nebulization Given 03/28/22 1256)  furosemide (LASIX) injection 40 mg (40 mg Intravenous Given 03/28/22 1328)  cefTRIAXone (ROCEPHIN) 1 g in sodium chloride 0.9 % 100 mL IVPB (0 g Intravenous Stopped 03/28/22 1428)  doxycycline (VIBRA-TABS) tablet 100 mg (100 mg Oral Given 03/28/22 1253)  losartan (COZAAR) tablet 25 mg (25 mg Oral Given 03/28/22 1254)  metoprolol succinate (TOPROL-XL) 24 hr tablet 25 mg (25 mg Oral Given 03/28/22 1254)    ED Course/ Medical Decision Making/ A&P Clinical Course as of 03/28/22 1527  Tue Mar 28, 2022  1520 Patient reassessed, she has improved  aeration diffusely.  Satting 97% on home 2 L nasal cannula.  Patient requesting to go home.  UA suggestive of UTI.  Will continue patient on Keflex for 7 days as well as doxycycline for possible atypical pneumonia.  She has breathing treatments at home and I advised increased half dose to baseline torsemide for the next 3 days and close follow-up with PCP.  She is hypertensive by have no concerns for hypertensive emergency.  She  is in agreement with this plan and safe for discharge at this time. [VB]    Clinical Course User Index [VB] Elgie Congo, MD                           Medical Decision Making VAUGHN FRIEZE is a 71 y.o. female.  With PMH of COPD on 2 L nasal cannula, HFpEF, history of A-fib and PE on Xarelto presenting with shortness of breath.  EMS provided 2 doses of DuoNeb, Solu-Medrol 125 mg and 1 g IV magnesium.   On my evaluation patient, she does have bilateral end expiratory wheezing but no increased work of breathing and O2 sat remained stable on home 2 L nasal cannula.  Her presentation seems to be a mixed picture of suspected mild fluid overload in the setting of medication noncompliance while on vacation and and dietary changes as well as COPD exacerbation in the setting of suspected viral URI versus bacterial pneumonia.  EKG obtained which I personally reviewed A-fib with nonspecific T wave changes similar to previous EKG.  She had stable troponins initial 5 repeat 4, no concern for ACS or demand ischemia.  Chest x-ray obtained which I personally reviewed with bilateral interstitial infiltrates concerning for edema.  BNP elevated at 193.  No focal consolidation concerning for pneumonia normal white blood cell count 6.9.  Creatinine 0.94 within normal limits.  UA suggestive of UTI sick considering patient does have urinary symptoms with positive nitrite trace leukocyte Estrace many bacteria 6-10 WBCs.  Patient given IV Rocephin dose here as well as p.o. doxycycline for  concern for possible atypical pneumonia.  She remained stable on 2 L nasal cannula.  She was hypertensive but has no findings concerning for hypertensive emergency.  She is at neurologic baseline.  Requesting discharge home.  Will discharge patient with 7 days Keflex for UTI, 7 days doxycycline for possible atypical pneumonia, prednisone burst of 40 mg for the next 4 days and advised increased half dose of patient's home torsemide for the next 3 days and close follow-up with PCP.  Strict return precaution discussed.  She is in agreement with plan and safe for discharge.  Amount and/or Complexity of Data Reviewed Labs: ordered. Radiology: ordered.  Risk Prescription drug management.   Final Clinical Impression(s) / ED Diagnoses Final diagnoses:  COPD exacerbation ([place])  Urinary tract infection without hematuria, site unspecified  Congestive heart failure, unspecified HF chronicity, unspecified heart failure type ([place])    Rx / DC Orders ED Discharge Orders          Ordered    predniSONE (DELTASONE) 20 MG tablet  Daily        03/28/22 1523    cephALEXin (KEFLEX) 500 MG capsule  2 times daily        03/28/22 1523    doxycycline (VIBRAMYCIN) 100 MG capsule  2 times daily        03/28/22 1523              Elgie Congo, MD 03/28/22 1527

## 2022-03-28 NOTE — Discharge Instructions (Signed)
You were seen in the emergency department today for an exacerbation of your COPD (Chronic Obstructive Pulmonary Disease). You were given breathing treatments and your first dose of steroids here. You were prescribed additional steroids (prednisone) to take during the following 4 days to help your symptoms improve. Please take this as directed.  You have also been prescribed antibiotics doxycycline and Keflex as you do have a UTI.  Take as prescribed and finish the entire course.  Continue to use your inhalers every 4 hours as needed for wheezing or shortness of breath.  Your chest x-ray showed evidence of heart failure, you need to take an extra half dose of your torsemide for the next 3 days.   Sometimes your breathing can get worse after it gets better, sometimes suddenly. If your breathing worsens and does not improve with your inhaler, or you need to use your inhaler more than 2 times every 4 hours, you should return to the emergency department immediately.  Please make an appointment with your primary care doctor within one week to assure improvement or resolution in symptoms.

## 2022-03-31 DIAGNOSIS — E1165 Type 2 diabetes mellitus with hyperglycemia: Secondary | ICD-10-CM | POA: Diagnosis not present

## 2022-03-31 DIAGNOSIS — Z79899 Other long term (current) drug therapy: Secondary | ICD-10-CM | POA: Diagnosis not present

## 2022-03-31 DIAGNOSIS — E119 Type 2 diabetes mellitus without complications: Secondary | ICD-10-CM | POA: Diagnosis not present

## 2022-03-31 DIAGNOSIS — M25562 Pain in left knee: Secondary | ICD-10-CM | POA: Diagnosis not present

## 2022-03-31 DIAGNOSIS — M25561 Pain in right knee: Secondary | ICD-10-CM | POA: Diagnosis not present

## 2022-03-31 DIAGNOSIS — G8929 Other chronic pain: Secondary | ICD-10-CM | POA: Diagnosis not present

## 2022-04-06 DIAGNOSIS — Z79899 Other long term (current) drug therapy: Secondary | ICD-10-CM | POA: Diagnosis not present

## 2022-04-10 NOTE — Progress Notes (Unsigned)
Cardiology Clinic Note   Patient Name: Sharon Vasquez Date of Encounter: 04/10/2022  Primary Care Provider:  [name], DO Primary Cardiologist:  Quay Burow, MD  Patient Profile    Sharon Vasquez is a 72 y.o. female  presents to the clinic for follow-up evaluation of her diastolic CHF and atrial fibrillation.  Past Medical History    Past Medical History:  Diagnosis Date   A-fib ([place])    Acute hypoxemic respiratory failure ([place]) 02/20/2022   Acute on chronic hypoxic respiratory failure ([place]) 01/04/2022   Acute upper respiratory infection 01/08/2022   Anxiety    Arthritis    "qwhre; joints, back" (04/17/2017)   Atrial fibrillation (HCC)    Benign breast cyst in female, left 01/08/2017   Found by Screening mammogram, evaluated by U/S on 01/08/17 and determined to be a benign simple breast cyst.   Breast cancer ([place])    Cellulitis of left lower leg 05/30/2017   CHF (congestive heart failure) (HCC)    Chronic low back pain 08/21/2016   Chronic lower back pain    Chronic venous insufficiency    /notes 05/30/2017   COPD (chronic obstructive pulmonary disease) ([place])    Depression    Diabetes ([place]) 01/08/2022   Diabetes mellitus without complication (HCC)    DVT (deep venous thrombosis) ([place]) 11/16/2016   Dysrhythmia    GERD (gastroesophageal reflux disease)    Headache    "weekly for the last 3 months" (04/17/2017)   Hyperlipidemia    Hypertension    Morbid obesity ([place])    PE (pulmonary embolism)    Pulmonary embolism ([place]) 09/21/2014   Sleep apnea    Past Surgical History:  Procedure Laterality Date   ABDOMINAL HYSTERECTOMY     APPENDECTOMY     BALLOON DILATION N/A 03/09/2020   Procedure: BALLOON DILATION;  Surgeon: Otis Brace, MD;  Location: [facility];  Service: Gastroenterology;  Laterality: N/A;   BIOPSY  03/09/2020   Procedure: BIOPSY;  Surgeon: Otis Brace, MD;  Location: [facility];  Service: Gastroenterology;;   BIOPSY  04/12/2021    Procedure: BIOPSY;  Surgeon: Otis Brace, MD;  Location: [facility];  Service: Gastroenterology;;   BREAST BIOPSY Right 03/11/2021   US biopsy/ coil clip/ path pending   BREAST BIOPSY Right 03/11/2021   US biopsy/ ribbone clip/ path pending   BREAST CYST EXCISION Left    "six o'clock"   BREAST LUMPECTOMY Left    BREAST LUMPECTOMY WITH RADIOACTIVE SEED LOCALIZATION Right 05/02/2021   Procedure: RIGHT BREAST LUMPECTOMY WITH RADIOACTIVE SEED LOCALIZATION;  Surgeon: Jovita Kussmaul, MD;  Location: [facility];  Service: General;  Laterality: Right;   CHOLECYSTECTOMY     DILATION AND CURETTAGE OF UTERUS     ESOPHAGOGASTRODUODENOSCOPY (EGD) WITH PROPOFOL N/A 03/09/2020   Procedure: ESOPHAGOGASTRODUODENOSCOPY (EGD) WITH PROPOFOL;  Surgeon: Otis Brace, MD;  [facility]: [facility];  Service: Gastroenterology;  Laterality: N/A;   ESOPHAGOGASTRODUODENOSCOPY (EGD) WITH PROPOFOL N/A 04/12/2021   Procedure: ESOPHAGOGASTRODUODENOSCOPY (EGD) WITH PROPOFOL;  Surgeon: Otis Brace, MD;  [facility]: [facility];  Service: Gastroenterology;  Laterality: N/A;   IR ABLATE LIVER CRYOABLATION  07/23/2019   IR RADIOLOGIST EVAL & MGMT  07/18/2019   SAVORY DILATION N/A 04/12/2021   Procedure: SAVORY DILATION;  Surgeon: Otis Brace, MD;  [facility];  Service: Gastroenterology;  Laterality: N/A;   TONSILLECTOMY AND ADENOIDECTOMY     TUBAL LIGATION      Allergies  No Known Allergies  History of Present Illness  Ms. Deuser has a PMH of PVD, benign essential hypertension, chronic venous insufficiency, aortic atherosclerosis, acute diastolic CHF, atrial fibrillation, and morbid obesity.   She was seen and evaluated by Dr. Alvester Chou 07/12/2016 when she was admitted with diastolic CHF and atrial fibrillation.  She was noted to have a history of treated hyperlipidemia and hypertension.  She had a pulmonary embolus and was placed on Xarelto.  She also had a history of tobacco abuse and continues to  smoke.  She had a chest CT 9/19 that showed left main/three-vessel coronary calcification.  She did complain of atypical chest pain.  She underwent a sleep study and was placed on CPAP.   She had a coronary CTA/FFR that showed a physiologically significant lesion in the small nondominant RCA.  She was noted to have gained 20 pounds since she was last seen.  She complained of increased shortness of breath, dyspnea on exertion and orthopnea.  An echocardiogram 4/18 showed normal LV systolic function.   She was last seen by Dr. Gwenlyn Found on 10/24/2019.  During that time she reported she continued to do well.  She did however continue to gain weight which she attributed to overeating.  She was compliant with her CPAP.  She was noted to have chronic shortness of breath but denied chest pain.  She was mostly homebound at the time of the visit, she reported compliance with her anticoagulation and she continues to smoke.   Admitted to the hospital with COPD exacerbation 04/10/20-04/15/20. Treated with abx and steroid taper.   She  was seen virtually 05/12/2020 in follow-up and stated she was breathing better since her hospitalization. She completed PT at SNF. She reported she remained sedentary at home.  She slept in a medical bed with head elevated due her sinus congestion.  She reported compliance with her torsemide medication.  She reported that she did not add any extra sodium to her foods and that her sister was helping with her meals.  She was in the process of trying to get an oxygen concentrator for home use.  She reported that she had a pulmonology appointment the next day.  She  had both COVID-19 vaccines and her booster.  I  gave her the salty 6 diet sheet, continued her current medications, had her increase her physical activity as tolerated (encouraged at least 15 minutes of physical activity daily, and planned her follow-up with Dr. Gwenlyn Found in 6 months.  She presented to the emergency department 07/24/2020 with  increased shortness of breath which was felt to be secondary to COPD and CHF exacerbation.  She was instructed to contact cardiology to discuss her diuretic medication.  She was seen by pulmonology on 08/03/2020 and instructed to follow-up with cardiology.   She presented to the clinic 08/11/20  for follow-up evaluation stated she was feeling better with her oxygen concentrator at home.  She reported that she had increased her physical activity and was exercising .  She continued to have significant weakness in her legs and had trouble walking short distances.   She reported her weight was improving and   she was no longer on steroids.  I gave her the salty 6 diet sheet, asked her to continue to increase her physical activity, continued her  medication regimen and planned follow-up in 3 to 4 months.  She was seen in follow-up by Dr. Gwenlyn Found on 08/17/2021.  She remained stable at that time.  She continued to have chronic shortness of breath.  She was essentially  wheelchair-bound and was using a motorized wheelchair due to her obesity and arthritis.  Her weight remains stable.  Her echocardiogram 7/22 showed normal LV function, moderate LVH, mild pulmonary hypertension, moderate TR and no evidence of MR.  She presents to the clinic today for follow-up evaluation and states***  Today she denies chest pain, shortness of breath, lower extremity edema, fatigue, palpitations, melena, hematuria, hemoptysis, diaphoresis, weakness, presyncope, syncope, orthopnea, and PND.    Home Medications    Prior to Admission medications   Medication Sig Start Date End Date Taking? Authorizing Provider  aspirin EC 81 MG tablet Take 81 mg by mouth daily.    [provider]  atorvastatin (LIPITOR) 40 MG tablet Take 1 tablet (40 mg total) by mouth daily. 12/24/19   Mitzi Hansen, MD  benazepril (LOTENSIN) 40 MG tablet Take 0.5 tablets (20 mg total) by mouth daily. 04/15/20 04/15/21  Andrew Au, MD  esomeprazole  (NEXIUM) 40 MG capsule Take 1 capsule (40 mg total) by mouth daily as needed (for heartburn or indigestion). Patient taking differently: Take 40 mg by mouth daily. 01/20/19   Marcelyn Bruins, MD  Fluticasone-Salmeterol (ADVAIR) 250-50 MCG/DOSE AEPB Inhale 1 puff into the lungs 2 (two) times daily. 06/18/20   Martyn Ehrich, NP  gabapentin (NEURONTIN) 400 MG capsule TAKE 1 CAPSULE BY MOUTH  TWICE DAILY Patient taking differently: Take 400 mg by mouth 2 (two) times daily. 02/05/20   Madalyn Rob, MD  ibuprofen (ADVIL) 200 MG tablet Take 800 mg by mouth every 6 (six) hours as needed (inflammation).    [provider]  ipratropium-albuterol (DUONEB) 0.5-2.5 (3) MG/3ML SOLN Take 3 mLs by nebulization every 6 (six) hours as needed. 06/18/20   Martyn Ehrich, NP  metoprolol succinate (TOPROL-XL) 50 MG 24 hr tablet TAKE 1 TABLET(50 MG) BY MOUTH DAILY WITH OR IMMEDIATELY FOLLOWING A MEAL 04/20/20   [place], Carley D, DO  mirabegron ER (MYRBETRIQ) 50 MG TB24 tablet Take 1 tablet (50 mg total) by mouth daily. 05/14/20   Andrew Au, MD  potassium chloride (KLOR-CON) 10 MEQ tablet Take 1 tablet (10 mEq total) by mouth 2 (two) times daily. 05/14/20   Andrew Au, MD  predniSONE (DELTASONE) 20 MG tablet Take 2 tablets (40 mg total) by mouth daily. Take 40 mg by mouth daily for 3 days, then '20mg'$  by mouth daily for 3 days, then '10mg'$  daily for 3 days 07/24/20   Little, Wenda Overland, MD  PRESCRIPTION MEDICATION Inhale into the lungs at bedtime. cpap    [provider]  rivaroxaban (XARELTO) 20 MG TABS tablet Take 1 tablet (20 mg total) by mouth every morning. 01/23/20   Iona Beard, MD  tiotropium (SPIRIVA HANDIHALER) 18 MCG inhalation capsule PLACE 1 CAPSULE INTO INHALER AND INHALE DAILY Patient taking differently: Place 18 mcg into inhaler and inhale daily. 12/05/19   Mitzi Hansen, MD  torsemide (DEMADEX) 20 MG tablet TAKE 2 TABLETS BY MOUTH DAILY Patient taking differently:  Take 20 mg by mouth 2 (two) times daily. 01/26/20   Modena Nunnery D, DO    Family History    Family History  Problem Relation Age of Onset   Breast cancer Mother        before age 70   Hypertension Mother    Hyperlipidemia Mother    Hypertension Maternal Grandfather    Hyperlipidemia Maternal Grandfather    She indicated that the status of her mother is unknown. She indicated that the status of her  maternal grandfather is unknown.  Social History    Social History   Socioeconomic History   Marital status: Divorced    Spouse name: Not on file   Number of children: Not on file   Years of education: Not on file   Highest education level: Not on file  Occupational History   Not on file  Tobacco Use   Smoking status: Former    Packs/day: 2.00    Years: 55.00    Total pack years: 110.00    Types: Cigarettes    Start date: 08/16/1961    Quit date: 01/02/2022    Years since quitting: 0.2   Smokeless tobacco: Never   Tobacco comments:    Pt smokes about 4 ciggs daily. Stopped 01/02/2022   Vaping Use   Vaping Use: Never used  Substance and Sexual Activity   Alcohol use: No   Drug use: No   Sexual activity: Not Currently    Partners: Male  Other Topics Concern   Not on file  Social History Narrative   Lives in [facility] senior [facility] in [place]. Lives alone, but is dependent in ADLs/IADLs. Previously had [place] PT and RN but dismissed them with plans to use the [employer]. Two daughters live nearby. Previously resided in [place].   Social Determinants of Health   Financial Resource Strain: Medium Risk (12/02/2021)   Overall Financial Resource Strain (CARDIA)    Difficulty of Paying Living Expenses: Somewhat hard  Food Insecurity: No Food Insecurity (03/15/2022)   Hunger Vital Sign    Worried About Running Out of Food in the Last Year: Never true    Ran Out of Food in the Last Year: Never true  Transportation Needs: No Transportation Needs (03/15/2022)   PRAPARE -  Hydrologist (Medical): No    Lack of Transportation (Non-Medical): No  Physical Activity: Inactive (12/02/2021)   Exercise Vital Sign    Days of Exercise per Week: 0 days    Minutes of Exercise per Session: 0 min  Stress: No Stress Concern Present (12/02/2021)   [place]    Feeling of Stress : Only a little  Social Connections: Moderately Integrated (03/15/2022)   Social Connection and Isolation Panel [NHANES]    Frequency of Communication with Friends and Family: More than three times a week    Frequency of Social Gatherings with Friends and Family: More than three times a week    Attends Religious Services: More than 4 times per year    Active Member of [employer] or Organizations: Yes    Attends Music therapist: More than 4 times per year    Marital Status: Divorced  Intimate Partner Violence: Not At Risk (03/15/2022)   Humiliation, Afraid, Rape, and Kick questionnaire    Fear of Current or Ex-Partner: No    Emotionally Abused: No    Physically Abused: No    Sexually Abused: No     Review of Systems    General:  No chills, fever, night sweats or weight changes.  Cardiovascular:  No chest pain, dyspnea on exertion, edema, orthopnea, palpitations, paroxysmal nocturnal dyspnea. Dermatological: No rash, lesions/masses Respiratory: No cough, dyspnea Urologic: No hematuria, dysuria Abdominal:   No nausea, vomiting, diarrhea, bright red blood per rectum, melena, or hematemesis Neurologic:  No visual changes, wkns, changes in mental status. All other systems reviewed and are otherwise negative except as noted above.  Physical Exam    VS:  There were no vitals taken for this visit. , BMI There is no height or weight on file to calculate BMI. GEN: Well nourished, well developed, in no acute distress. HEENT: normal. Neck: Supple, no JVD, carotid bruits, or masses. Cardiac: RRR, no  murmurs, rubs, or gallops. No clubbing, cyanosis, edema.  Radials/DP/PT 2+ and equal bilaterally.  Respiratory:  Respirations regular and unlabored, clear to auscultation bilaterally. GI: Soft, nontender, nondistended, BS + x 4. MS: no deformity or atrophy. Skin: warm and dry, no rash. Neuro:  Strength and sensation are intact. Psych: Normal affect.  Accessory Clinical Findings    Recent Labs: 01/08/2022: Magnesium 1.6 03/28/2022: ALT 19; B Natriuretic Peptide 193.9; BUN 10; Creatinine, Ser 0.94; Hemoglobin 11.5; Platelets 400; Potassium 3.6; Sodium 143   Recent Lipid Panel    Component Value Date/Time   CHOL 91 (L) 08/25/2021 1006   TRIG 99 08/25/2021 1006   HDL 26 (L) 08/25/2021 1006   CHOLHDL 3.5 08/25/2021 1006   LDLCALC 46 08/25/2021 1006    ECG personally reviewed by me today-none today.  Echocardiogram 09/30/2019 Echocardiogram 09/30/2019   IMPRESSIONS     1. Left ventricular ejection fraction, by estimation, is 65 to 70%. The  left ventricle has hyperdynamic function. The left ventricle has no  regional wall motion abnormalities. The left ventricular internal cavity  size was mildly dilated. There is mild  concentric left ventricular hypertrophy. Left ventricular diastolic  parameters are consistent with Grade II diastolic dysfunction  (pseudonormalization). Elevated left atrial pressure.   2. Right ventricular systolic function is hyperdynamic. The right  ventricular size is normal.   3. Left atrial size was moderately dilated.   4. The mitral valve is normal in structure. Trivial mitral valve  regurgitation.   5. The aortic valve is normal in structure. Aortic valve regurgitation is  not visualized. Mild aortic valve sclerosis is present, with no evidence  of aortic valve stenosis.   6. The inferior vena cava is dilated in size with >50% respiratory  variability, suggesting right atrial pressure of 8 mmHg.   Comparison(s): Prior images reviewed side by side.  The left ventricular  function is unchanged. The left ventricular diastolic function is  significantly worse. Findings are consistent ith high output diastolic  heart failure, such as obesity cardiomyopathy   (should exclude anemia, thyrotoxicosis, AV fistula, etc.).  Echocardiogram 02/21/2022  IMPRESSIONS     1. Left ventricular ejection fraction, by estimation, is 60 to 65%. The  left ventricle has normal function. The left ventricle has no regional  wall motion abnormalities. There is moderate concentric left ventricular  hypertrophy. Left ventricular  diastolic parameters are indeterminate.   2. Right ventricular systolic function is normal. The right ventricular  size is normal. There is normal pulmonary artery systolic pressure.   3. Left atrial size was mildly dilated.   4. The mitral valve is grossly normal. Trivial mitral valve  regurgitation. No evidence of mitral stenosis.   5. The aortic valve is tricuspid. There is mild calcification of the  aortic valve. Aortic valve regurgitation is not visualized. Aortic valve  sclerosis is present, with no evidence of aortic valve stenosis.   6. There is mild dilatation of the ascending aorta, measuring 40 mm.   7. The inferior vena cava is dilated in size with >50% respiratory  variability, suggesting right atrial pressure of 8 mmHg.   Comparison(s): No significant change from prior study.   Assessment & Plan   1.  HFpEF, diastolic CHF- 1+  bilateral lower extremity***.  Chronic shortness of breath stable.  Echocardiogram 02/21/2022 showed an EF of 60-65, with intermediate diastolic parameters Continue torsemide, potassium, metoprolol,  Jardiance, losartan Heart healthy low-sodium diet-salty 6 given Increase physical activity as tolerated-discussed improving fitness  Essential hypertension-BP today 152/108*** had not yet taken her blood pressure medication.   Continue metoprolol, torsemide, losartan Heart healthy low-sodium  diet-salty 6 given Increase physical activity as tolerated   Morbid obesity-weight today 392*** pounds.  Continue weight loss Heart healthy low-sodium diet-salty 6 reviewed Increase physical activity as tolerated   Paroxysmal atrial fibrillation-heart rate today 96***.  Cardiac unaware.  Reports compliance with Xarelto and denies bleeding issues. Continue Xarelto Increase physical activity as tolerated Avoid triggers caffeine, chocolate, EtOH, dehydration etc.   Coronary artery calcification-denies recent chest tightness or discomfort.  Previously identified on coronary CTA and  CT scan Continue Xarelto, aspirin, metoprolol Heart healthy low-sodium diet-salty 6 given Increase physical activity as tolerated   COPD-continues to smoke .  Smoking cessation encouraged Follows with pulmonology  Disposition: Follow-up with Dr. Gwenlyn Found or me in 6 months.  Jossie Ng. Jennfier Abdulla NP-C    04/10/2022, 6:55 AM [place] Office [phone] Fax [phone]  Notice: This dictation was prepared with Dragon dictation along with smaller phrase technology. Any transcriptional errors that result from this process are unintentional and may not be corrected upon review.  I spent 15 ***minutes examining this patient, reviewing medications, and using patient centered shared decision making involving her cardiac care.  Prior to her visit I spent greater than 20 minutes reviewing her past medical history,  medications, and prior cardiac tests.

## 2022-04-12 ENCOUNTER — Encounter: Payer: Self-pay | Admitting: General Practice

## 2022-04-12 ENCOUNTER — Ambulatory Visit: Payer: 59 | Attending: General Practice | Admitting: General Practice

## 2022-04-12 VITALS — BP 131/77 | HR 77

## 2022-04-12 DIAGNOSIS — J449 Chronic obstructive pulmonary disease, unspecified: Secondary | ICD-10-CM

## 2022-04-12 DIAGNOSIS — I5030 Unspecified diastolic (congestive) heart failure: Secondary | ICD-10-CM | POA: Diagnosis not present

## 2022-04-12 DIAGNOSIS — M170 Bilateral primary osteoarthritis of knee: Secondary | ICD-10-CM | POA: Diagnosis not present

## 2022-04-12 DIAGNOSIS — I5032 Chronic diastolic (congestive) heart failure: Secondary | ICD-10-CM

## 2022-04-12 DIAGNOSIS — I4821 Permanent atrial fibrillation: Secondary | ICD-10-CM | POA: Diagnosis not present

## 2022-04-12 DIAGNOSIS — I2510 Atherosclerotic heart disease of native coronary artery without angina pectoris: Secondary | ICD-10-CM

## 2022-04-12 DIAGNOSIS — I10 Essential (primary) hypertension: Secondary | ICD-10-CM

## 2022-04-12 DIAGNOSIS — G4733 Obstructive sleep apnea (adult) (pediatric): Secondary | ICD-10-CM | POA: Diagnosis not present

## 2022-04-12 NOTE — Patient Instructions (Signed)
Medication Instructions:  The current medical regimen is effective;  continue present plan and medications as directed. Please refer to the Current Medication list given to you today.  *If you need a refill on your cardiac medications before your next appointment, please call your pharmacy*  Lab Work: NONE If you have labs (blood work) drawn today and your tests are completely normal, you will receive your results only by: [place] (if you have MyChart) OR A paper copy in the mail If you have any lab test that is abnormal or we need to change your treatment, we will call you to review the results.  Testing/Procedures: NONE   Follow-Up: At [facility], you and your health needs are our priority.  As part of our continuing mission to provide you with exceptional heart care, we have created designated Provider Care Teams.  These Care Teams include your primary Cardiologist (physician) and Advanced Practice Providers (APPs -  Physician Assistants and Nurse Practitioners) who all work together to provide you with the care you need, when you need it.  We recommend signing up for the patient portal called "MyChart".  Sign up information is provided on this After Visit Summary.  MyChart is used to connect with patients for Virtual Visits (Telemedicine).  Patients are able to view lab/test results, encounter notes, upcoming appointments, etc.  Non-urgent messages can be sent to your provider as well.   To learn more about what you can do with MyChart, go to [email or web address].    Your next appointment:   6 month(s)  The format for your next appointment:   In Person  Provider:   Quay Burow, MD  or Coletta Memos, FNP       Other Instructions PLEASE READ AND FOLLOW ATTACHED  SALTY 6   CONTINUE TO MONITOR FLUIDS  INCREASE PHYSICAL ACTIVITIES AS TOLERATED  PLEASE TRY TO DO ATTACHED CHAIR EXERCISES-AS SAFE FOR YOU   Important Information About Sugar          Exercises to do While Sitting  Exercises that you do while sitting (chair exercises) can give you many of the same benefits as full exercise. Benefits include strengthening your heart, burning calories, and keeping muscles and joints healthy. Exercise can also improve your mood and help with depression and anxiety. You may benefit from chair exercises if you are unable to do standing exercises due to: Diabetic foot pain. Obesity. Illness. Arthritis. Recovery from surgery or injury. Breathing problems. Balance problems. Another type of disability. Before starting chair exercises, check with your health care provider or a physical therapist to find out how much exercise you can tolerate and which exercises are safe for you. If your health care provider approves: Start out slowly and build up over time. Aim to work up to about 10-20 minutes for each exercise session. Make exercise part of your daily routine. Drink water when you exercise. Do not wait until you are thirsty. Drink every 10-15 minutes. Stop exercising right away if you have pain, nausea, shortness of breath, or dizziness. If you are exercising in a wheelchair, make sure to lock the wheels. Ask your health care provider whether you can do tai chi or yoga. Many positions in these mind-body exercises can be modified to do while seated. Warm-up Before starting other exercises: Sit up as straight as you can. Have your knees bent at 90 degrees, which is the shape of the capital letter "L." Keep your feet flat on the floor. Sit at the front  edge of your chair, if you can. Pull in (tighten) the muscles in your abdomen and stretch your spine and neck as straight as you can. Hold this position for a few minutes. Breathe in and out evenly. Try to concentrate on your breathing, and relax your mind. Stretching Exercise A: Arm stretch Hold your arms out straight in front of your body. Bend your hands at the wrist with your fingers  pointing up, as if signaling someone to stop. Notice the slight tension in your forearms as you hold the position. Keeping your arms out and your hands bent, rotate your hands outward as far as you can and hold this stretch. Aim to have your thumbs pointing up and your pinkie fingers pointing down. Slowly repeat arm stretches for one minute as tolerated. Exercise B: Leg stretch If you can move your legs, try to "draw" letters on the floor with the toes of your foot. Write your name with one foot. Write your name with the toes of your other foot. Slowly repeat the movements for one minute as tolerated. Exercise C: Reach for the sky Reach your hands as far over your head as you can to stretch your spine. Move your hands and arms as if you are climbing a rope. Slowly repeat the movements for one minute as tolerated. Range of motion exercises Exercise A: Shoulder roll Let your arms hang loosely at your sides. Lift just your shoulders up toward your ears, then let them relax back down. When your shoulders feel loose, rotate your shoulders in backward and forward circles. Do shoulder rolls slowly for one minute as tolerated. Exercise B: March in place As if you are marching, pump your arms and lift your legs up and down. Lift your knees as high as you can. If you are unable to lift your knees, just pump your arms and move your ankles and feet up and down. March in place for one minute as tolerated. Exercise C: Seated jumping jacks Let your arms hang down straight. Keeping your arms straight, lift them up over your head. Aim to point your fingers to the ceiling. While you lift your arms, straighten your legs and slide your heels along the floor to your sides, as wide as you can. As you bring your arms back down to your sides, slide your legs back together. If you are unable to use your legs, just move your arms. Slowly repeat seated jumping jacks for one minute as tolerated. Strengthening  exercises Exercise A: Shoulder squeeze Hold your arms straight out from your body to your sides, with your elbows bent and your fists pointed at the ceiling. Keeping your arms in the bent position, move them forward so your elbows and forearms meet in front of your face. Open your arms back out as wide as you can with your elbows still bent, until you feel your shoulder blades squeezing together. Hold for 5 seconds. Slowly repeat the movements forward and backward for one minute as tolerated. Contact a health care provider if: You have to stop exercising due to any of the following: Pain. Nausea. Shortness of breath. Dizziness. Fatigue. You have significant pain or soreness after exercising. Get help right away if: You have chest pain. You have difficulty breathing. These symptoms may represent a serious problem that is an emergency. Do not wait to see if the symptoms will go away. Get medical help right away. Call your local emergency services (911 in the [place] Do not drive yourself to the  hospital. Summary Exercises that you do while sitting (chair exercises) can strengthen your heart, burn calories, and keep muscles and joints healthy. You may benefit from chair exercises if you are unable to do standing exercises due to diabetic foot pain, obesity, recovery from surgery or injury, or other conditions. Before starting chair exercises, check with your health care provider or a physical therapist to find out how much exercise you can tolerate and which exercises are safe for you. This information is not intended to replace advice given to you by your health care provider. Make sure you discuss any questions you have with your health care provider. Document Revised: 05/16/2020 Document Reviewed: 05/16/2020 Elsevier Patient Education  [place].

## 2022-04-13 ENCOUNTER — Telehealth: Payer: Self-pay

## 2022-04-13 ENCOUNTER — Other Ambulatory Visit: Payer: Self-pay

## 2022-04-13 ENCOUNTER — Ambulatory Visit (INDEPENDENT_AMBULATORY_CARE_PROVIDER_SITE_OTHER): Payer: 59 | Admitting: Student

## 2022-04-13 ENCOUNTER — Other Ambulatory Visit: Payer: Self-pay | Admitting: Internal Medicine

## 2022-04-13 ENCOUNTER — Encounter: Payer: Self-pay | Admitting: Student

## 2022-04-13 VITALS — BP 138/92 | HR 90 | Temp 97.6°F | Ht 74.0 in | Wt 370.3 lb

## 2022-04-13 DIAGNOSIS — R32 Unspecified urinary incontinence: Secondary | ICD-10-CM

## 2022-04-13 DIAGNOSIS — J9611 Chronic respiratory failure with hypoxia: Secondary | ICD-10-CM

## 2022-04-13 DIAGNOSIS — N3000 Acute cystitis without hematuria: Secondary | ICD-10-CM

## 2022-04-13 DIAGNOSIS — N390 Urinary tract infection, site not specified: Secondary | ICD-10-CM | POA: Insufficient documentation

## 2022-04-13 DIAGNOSIS — R7303 Prediabetes: Secondary | ICD-10-CM

## 2022-04-13 DIAGNOSIS — Z7189 Other specified counseling: Secondary | ICD-10-CM | POA: Diagnosis not present

## 2022-04-13 DIAGNOSIS — E119 Type 2 diabetes mellitus without complications: Secondary | ICD-10-CM | POA: Diagnosis not present

## 2022-04-13 DIAGNOSIS — J439 Emphysema, unspecified: Secondary | ICD-10-CM

## 2022-04-13 DIAGNOSIS — Z87891 Personal history of nicotine dependence: Secondary | ICD-10-CM

## 2022-04-13 DIAGNOSIS — I5032 Chronic diastolic (congestive) heart failure: Secondary | ICD-10-CM | POA: Diagnosis not present

## 2022-04-13 MED ORDER — SEMAGLUTIDE(0.25 OR 0.5MG/DOS) 2 MG/3ML ~~LOC~~ SOPN: 0.5000 [IU] | PEN_INJECTOR | SUBCUTANEOUS | 2 refills | Status: DC

## 2022-04-13 NOTE — Patient Instructions (Signed)
Return in 2-3 months for routine follow up and, for discussion about palliative care.   Expect a call from the following department(s):  Referral Orders         Amb Referral to Palliative Care     Please call our clinic at [phone] Monday through Friday from 9 am to 4 pm if you have questions or concerns about your health. If after hours or on the weekend, call the main hospital number and ask for the Internal Medicine Resident On-Call. If you need medication refills, please notify your pharmacy one week in advance and they will send [place] a request.   Best, Nani Gasser, [place]

## 2022-04-13 NOTE — Assessment & Plan Note (Signed)
Refilled this patient's semaglutide per her request today.

## 2022-04-13 NOTE — Assessment & Plan Note (Signed)
Was seen in the emergency department for several days of foul-smelling urine and treated for acute cystitis after she was found to have bacteriuria.  She completed a course of antibiotics and her urinary symptoms have improved.

## 2022-04-13 NOTE — Assessment & Plan Note (Signed)
This continues to be an issue and is the patient's chief concern today.  She reports ongoing urinary incontinence that she feels is exacerbated by her diuretic and SGLT2 inhibitor.  This leads to poor adherence to these medications.  She has been evaluated for this in the past by urology and has also been treated with pelvic physical therapy.  It keeps her from getting good sleep and keeps her from participating in the activities that she enjoys.  She currently takes Myrbetriq but it does not seem to be helping.  I offered referral to urology but she declined.

## 2022-04-13 NOTE — Telephone Encounter (Signed)
(  3:46 pm) PC SW scheduled initial palliative care visit with patient. RN/SW team to see patient on 04/20/22 @ 11am.

## 2022-04-13 NOTE — Assessment & Plan Note (Signed)
Had a conversation about goals of care with this patient today.  She lives with multiple chronic, progressive conditions and her quality of life is suffering due to symptoms from these conditions including shortness of breath, lower extremity edema, and urinary incontinence.  Her insight into her health seems good and she recognizes that many of these conditions are likely not curable.  She would appreciate an overall approach to care that is focused on symptom management that would enable her to do the things that she enjoys like participate in community organizations and spend time with family and friends.  Based on this conversation I offered referral to palliative care services, which she accepted today.

## 2022-04-13 NOTE — Progress Notes (Signed)
Subjective:  [name] is a 72 y.o. who presents to clinic for follow up after ED visit for COPD exacerbation and UTI. Treated with a course of cephalexin and doxycycline. Also treated with 40 mg prednisone x4 days and recommendation to increase torsemide for a few days after encounter.  UTI symptoms have resolved.  Went to cardiology before this visit.  Stopped smoking in October 2023. Still has cravings but hasn't relapsed.   Using manual wheelchair at home. Able to do things like wash clothes and do dishes, albeit with significant dyspnea.  Review of Systems  Constitutional:  Negative for chills, fever and weight loss.  Respiratory:  Negative for cough and shortness of breath.   Cardiovascular:  Negative for chest pain.  Genitourinary:  Positive for urgency.    Patient Active Problem List   Diagnosis Date Noted   UTI (urinary tract infection) 04/13/2022   Anxiety attack 03/17/2022   Prediabetes 04/09/2021   Goals of care, counseling/discussion 03/23/2021   Malignant neoplasm of upper-outer quadrant of right breast in female, estrogen receptor positive ([place]) 03/21/2021   Right foot pain 03/03/2021   (HFpEF) heart failure with preserved ejection fraction ([place])    Osteoarthritis of left hip 10/07/2020   Venous stasis ulcer of left lower extremity ([place]) 08/18/2020   Hyperlipidemia 05/14/2020   Chronic respiratory failure with hypoxia ([place]) 09/30/2019   Urinary incontinence 08/21/2019   GERD (gastroesophageal reflux disease) 01/20/2019   OSA (obstructive sleep apnea) 11/14/2017   Chronic pain syndrome 07/27/2017   Major depression, recurrent, chronic ([place]) 02/01/2017   Osteoporosis 12/14/2016   Aortic atherosclerosis ([place]) 07/17/2016   Chronic venous insufficiency 07/12/2016   Chronic anticoagulation 07/12/2016   Tobacco use 07/11/2016   COPD (chronic obstructive pulmonary disease) (HCC)    Diastolic heart failure (HCC)    Atrial fibrillation with controlled  ventricular response (HCC)    Vocal cord polyp 12/18/2015   Morbid obesity ([place])    Peripheral vascular disease ([place])    Benign essential HTN    Primary osteoarthritis of both knees 09/16/2014    Family history: Reviewed in medical record.  Social history: Reviewed in medical record.   Objective:   Vitals:   04/13/22 1332  BP: (!) 138/92  Pulse: 90  Temp: 97.6 F (36.4 C)  TempSrc: Oral  SpO2: 96%  Weight: (!) 370 lb 4.8 oz (168 kg)  Height: '6\' 2"'$  (1.88 m)    Physical Exam Constitutional:      General: She is not in acute distress. Cardiovascular:     Rate and Rhythm: Normal rate and regular rhythm.  Pulmonary:     Breath sounds: No stridor. No wheezing.     Comments: Oxygen via nasal cannula Musculoskeletal:     Right lower leg: Edema present.     Left lower leg: Edema present.  Skin:    General: Skin is warm and dry.  Neurological:     Mental Status: She is alert. Mental status is at baseline.  Psychiatric:        Mood and Affect: Mood normal.        Behavior: Behavior normal.     Assessment & Plan:  The primary encounter diagnosis was Chronic heart failure with preserved ejection fraction ([place]). Diagnoses of Morbid obesity ([place]), New onset type 2 diabetes mellitus ([place]), Chronic respiratory failure with hypoxia ([place]), Pulmonary emphysema, unspecified emphysema type ([place]), Acute cystitis without hematuria, Urinary incontinence, unspecified type, Prediabetes, and Goals of care, counseling/discussion were also pertinent  to this visit.  UTI (urinary tract infection) Was seen in the emergency department for several days of foul-smelling urine and treated for acute cystitis after she was found to have bacteriuria.  She completed a course of antibiotics and her urinary symptoms have improved.  Urinary incontinence This continues to be an issue and is the patient's chief concern today.  She reports ongoing urinary incontinence that she feels is exacerbated by her  diuretic and SGLT2 inhibitor.  This leads to poor adherence to these medications.  She has been evaluated for this in the past by urology and has also been treated with pelvic physical therapy.  It keeps her from getting good sleep and keeps her from participating in the activities that she enjoys.  She currently takes Myrbetriq but it does not seem to be helping.  I offered referral to urology but she declined.  Prediabetes Refilled this patient's semaglutide per her request today.  Goals of care, counseling/discussion Had a conversation about goals of care with this patient today.  She lives with multiple chronic, progressive conditions and her quality of life is suffering due to symptoms from these conditions including shortness of breath, lower extremity edema, and urinary incontinence.  Her insight into her health seems good and she recognizes that many of these conditions are likely not curable.  She would appreciate an overall approach to care that is focused on symptom management that would enable her to do the things that she enjoys like participate in community organizations and spend time with family and friends.  Based on this conversation I offered referral to palliative care services, which she accepted today.    Return in 2-3 months for routine follow up and, for discussion about palliative care.  Patient discussed with Dr. Luna Kitchens MD 04/13/2022, 8:22 PM  Pager: [phone]

## 2022-04-14 NOTE — Addendum Note (Signed)
Addended by: Jodean Lima on: 04/14/2022 09:33 AM   Modules accepted: Level of Service

## 2022-04-14 NOTE — Progress Notes (Signed)
Internal Medicine Clinic Attending  Case discussed with Dr. McLendon  At the time of the visit.  We reviewed the resident's history and exam and pertinent patient test results.  I agree with the assessment, diagnosis, and plan of care documented in the resident's note.  

## 2022-04-19 DIAGNOSIS — J449 Chronic obstructive pulmonary disease, unspecified: Secondary | ICD-10-CM | POA: Diagnosis not present

## 2022-04-20 ENCOUNTER — Other Ambulatory Visit: Payer: Self-pay

## 2022-04-20 ENCOUNTER — Other Ambulatory Visit: Payer: 59

## 2022-04-20 ENCOUNTER — Telehealth: Payer: Self-pay

## 2022-04-20 DIAGNOSIS — Z515 Encounter for palliative care: Secondary | ICD-10-CM

## 2022-04-20 NOTE — Progress Notes (Signed)
The palliative care team had a scheduled visit with patient today 04/20/22 '@11am'$ . While in route to visit, patient called (11:13 am) and stated that she wanted to cancel visit as team was running late from another appointment. She also advised that she wanted to cancel all future appointments with the team.  Patient request noted.

## 2022-04-20 NOTE — Telephone Encounter (Signed)
1120 Palliative Care Note    RN & SW scheduled to see pt for appt at 11am today. While driving to pt's home for visit after brief delay with prior visit, pt called (1114am) and left message expressing dissatifaction with palliative care staff for being late. Pt cancelled appt for today and declined further visits from pallitative care.   Pt discharged.   Jacqulyn Cane, RN

## 2022-04-25 ENCOUNTER — Telehealth: Payer: Self-pay

## 2022-04-25 NOTE — Telephone Encounter (Signed)
error 

## 2022-04-26 ENCOUNTER — Observation Stay (HOSPITAL_COMMUNITY)
Admission: EM | Admit: 2022-04-26 | Discharge: 2022-04-27 | Disposition: A | Discharge status: Home / Self Care | Payer: 59 | Attending: Internal Medicine | Admitting: Internal Medicine

## 2022-04-26 ENCOUNTER — Encounter (HOSPITAL_COMMUNITY): Payer: Self-pay

## 2022-04-26 ENCOUNTER — Other Ambulatory Visit: Payer: Self-pay

## 2022-04-26 ENCOUNTER — Emergency Department (HOSPITAL_COMMUNITY): Payer: 59

## 2022-04-26 DIAGNOSIS — Z86718 Personal history of other venous thrombosis and embolism: Secondary | ICD-10-CM | POA: Diagnosis not present

## 2022-04-26 DIAGNOSIS — I5030 Unspecified diastolic (congestive) heart failure: Secondary | ICD-10-CM | POA: Diagnosis not present

## 2022-04-26 DIAGNOSIS — Z20822 Contact with and (suspected) exposure to covid-19: Secondary | ICD-10-CM | POA: Diagnosis not present

## 2022-04-26 DIAGNOSIS — Z7984 Long term (current) use of oral hypoglycemic drugs: Secondary | ICD-10-CM | POA: Insufficient documentation

## 2022-04-26 DIAGNOSIS — E119 Type 2 diabetes mellitus without complications: Secondary | ICD-10-CM | POA: Diagnosis not present

## 2022-04-26 DIAGNOSIS — I5032 Chronic diastolic (congestive) heart failure: Secondary | ICD-10-CM | POA: Insufficient documentation

## 2022-04-26 DIAGNOSIS — I499 Cardiac arrhythmia, unspecified: Secondary | ICD-10-CM | POA: Diagnosis not present

## 2022-04-26 DIAGNOSIS — J9601 Acute respiratory failure with hypoxia: Principal | ICD-10-CM

## 2022-04-26 DIAGNOSIS — I161 Hypertensive emergency: Secondary | ICD-10-CM | POA: Diagnosis present

## 2022-04-26 DIAGNOSIS — I110 Hypertensive heart disease with heart failure: Secondary | ICD-10-CM

## 2022-04-26 DIAGNOSIS — Z79899 Other long term (current) drug therapy: Secondary | ICD-10-CM | POA: Diagnosis not present

## 2022-04-26 DIAGNOSIS — Z853 Personal history of malignant neoplasm of breast: Secondary | ICD-10-CM | POA: Diagnosis not present

## 2022-04-26 DIAGNOSIS — J810 Acute pulmonary edema: Secondary | ICD-10-CM | POA: Diagnosis not present

## 2022-04-26 DIAGNOSIS — J9621 Acute and chronic respiratory failure with hypoxia: Secondary | ICD-10-CM

## 2022-04-26 DIAGNOSIS — Z87891 Personal history of nicotine dependence: Secondary | ICD-10-CM

## 2022-04-26 DIAGNOSIS — I4819 Other persistent atrial fibrillation: Secondary | ICD-10-CM | POA: Diagnosis not present

## 2022-04-26 DIAGNOSIS — Z8673 Personal history of transient ischemic attack (TIA), and cerebral infarction without residual deficits: Secondary | ICD-10-CM | POA: Insufficient documentation

## 2022-04-26 DIAGNOSIS — Z7982 Long term (current) use of aspirin: Secondary | ICD-10-CM | POA: Insufficient documentation

## 2022-04-26 DIAGNOSIS — G4733 Obstructive sleep apnea (adult) (pediatric): Secondary | ICD-10-CM | POA: Insufficient documentation

## 2022-04-26 DIAGNOSIS — J90 Pleural effusion, not elsewhere classified: Secondary | ICD-10-CM | POA: Diagnosis not present

## 2022-04-26 DIAGNOSIS — R069 Unspecified abnormalities of breathing: Secondary | ICD-10-CM | POA: Diagnosis not present

## 2022-04-26 DIAGNOSIS — J449 Chronic obstructive pulmonary disease, unspecified: Secondary | ICD-10-CM | POA: Diagnosis not present

## 2022-04-26 DIAGNOSIS — Z743 Need for continuous supervision: Secondary | ICD-10-CM | POA: Diagnosis not present

## 2022-04-26 DIAGNOSIS — I4891 Unspecified atrial fibrillation: Secondary | ICD-10-CM | POA: Diagnosis not present

## 2022-04-26 DIAGNOSIS — R0602 Shortness of breath: Secondary | ICD-10-CM | POA: Diagnosis not present

## 2022-04-26 DIAGNOSIS — R6889 Other general symptoms and signs: Secondary | ICD-10-CM | POA: Diagnosis not present

## 2022-04-26 DIAGNOSIS — R0689 Other abnormalities of breathing: Secondary | ICD-10-CM | POA: Diagnosis not present

## 2022-04-26 LAB — BASIC METABOLIC PANEL
Anion gap: 11 (ref 5–15)
BUN: 10 mg/dL (ref 8–23)
CO2: 26 mmol/L (ref 22–32)
Calcium: 9 mg/dL (ref 8.9–10.3)
Chloride: 104 mmol/L (ref 98–111)
Creatinine, Ser: 0.91 mg/dL (ref 0.44–1.00)
GFR, Estimated: >60 mL/min (ref >60)
Glucose, Bld: 113 mg/dL — ABNORMAL HIGH (ref 70–99)
Potassium: 3.3 mmol/L — ABNORMAL LOW (ref 3.5–5.1)
Sodium: 141 mmol/L (ref 135–145)

## 2022-04-26 LAB — BRAIN NATRIURETIC PEPTIDE: B Natriuretic Peptide: 254.3 pg/mL — ABNORMAL HIGH (ref 0.0–100.0)

## 2022-04-26 LAB — CBC
HCT: 36.8 % (ref 36.0–46.0)
Hemoglobin: 12.1 g/dL (ref 12.0–15.0)
MCH: 28.5 pg (ref 26.0–34.0)
MCHC: 32.9 g/dL (ref 30.0–36.0)
MCV: 86.6 fL (ref 80.0–100.0)
Platelets: 451 10*3/uL — ABNORMAL HIGH (ref 150–400)
RBC: 4.25 MIL/uL (ref 3.87–5.11)
RDW: 13.9 % (ref 11.5–15.5)
WBC: 6.7 10*3/uL (ref 4.0–10.5)
nRBC: 0 % (ref 0.0–0.2)

## 2022-04-26 LAB — CBG MONITORING, ED
Glucose-Capillary: 115 mg/dL — ABNORMAL HIGH (ref 70–99)
Glucose-Capillary: 197 mg/dL — ABNORMAL HIGH (ref 70–99)
Glucose-Capillary: 188 mg/dL — ABNORMAL HIGH (ref 70–99)
Glucose-Capillary: 165 mg/dL — ABNORMAL HIGH (ref 70–99)

## 2022-04-26 LAB — RESP PANEL BY RT-PCR (RSV, FLU A&B, COVID)  RVPGX2
Influenza A by PCR: NEGATIVE
Influenza B by PCR: NEGATIVE
Resp Syncytial Virus by PCR: NEGATIVE
SARS Coronavirus 2 by RT PCR: NEGATIVE

## 2022-04-26 MED ORDER — OXYCODONE HCL 5 MG PO TABS
15.0000 mg | ORAL_TABLET | Freq: Four times a day (QID) | ORAL | Status: DC | PRN
  Administered 2022-04-26: 15 mg via ORAL
  Filled 2022-04-26: qty 3

## 2022-04-26 MED ORDER — FLUTICASONE FUROATE-VILANTEROL 100-25 MCG/ACT IN AEPB
1.0000 | INHALATION_SPRAY | Freq: Every day | RESPIRATORY_TRACT | Status: DC
  Administered 2022-04-26 – 2022-04-27 (×2): 1 via RESPIRATORY_TRACT
  Filled 2022-04-26: qty 28

## 2022-04-26 MED ORDER — ALBUTEROL SULFATE (2.5 MG/3ML) 0.083% IN NEBU
10.0000 mg/h | INHALATION_SOLUTION | Freq: Once | RESPIRATORY_TRACT | Status: AC
  Administered 2022-04-26: 10 mg/h via RESPIRATORY_TRACT
  Filled 2022-04-26: qty 12

## 2022-04-26 MED ORDER — GABAPENTIN 400 MG PO CAPS
400.0000 mg | ORAL_CAPSULE | Freq: Every day | ORAL | Status: DC
  Administered 2022-04-26: 400 mg via ORAL
  Filled 2022-04-26: qty 1

## 2022-04-26 MED ORDER — IPRATROPIUM-ALBUTEROL 0.5-2.5 (3) MG/3ML IN SOLN
3.0000 mL | RESPIRATORY_TRACT | Status: DC | PRN
  Administered 2022-04-27: 3 mL via RESPIRATORY_TRACT
  Filled 2022-04-26: qty 3

## 2022-04-26 MED ORDER — METOPROLOL SUCCINATE ER 25 MG PO TB24
25.0000 mg | ORAL_TABLET | Freq: Every day | ORAL | Status: DC
  Administered 2022-04-26 – 2022-04-27 (×2): 25 mg via ORAL
  Filled 2022-04-26 (×2): qty 1

## 2022-04-26 MED ORDER — UMECLIDINIUM BROMIDE 62.5 MCG/ACT IN AEPB
1.0000 | INHALATION_SPRAY | Freq: Every day | RESPIRATORY_TRACT | Status: DC
  Administered 2022-04-26 – 2022-04-27 (×2): 1 via RESPIRATORY_TRACT
  Filled 2022-04-26: qty 7

## 2022-04-26 MED ORDER — LOSARTAN POTASSIUM 50 MG PO TABS
25.0000 mg | ORAL_TABLET | Freq: Every day | ORAL | Status: DC
  Administered 2022-04-26 – 2022-04-27 (×2): 25 mg via ORAL
  Filled 2022-04-26 (×2): qty 1

## 2022-04-26 MED ORDER — NITROGLYCERIN IN D5W 200-5 MCG/ML-% IV SOLN
0.0000 ug/min | INTRAVENOUS | Status: DC
  Administered 2022-04-26: 5 ug/min via INTRAVENOUS
  Filled 2022-04-26: qty 250

## 2022-04-26 MED ORDER — RIVAROXABAN 10 MG PO TABS
20.0000 mg | ORAL_TABLET | Freq: Every morning | ORAL | Status: DC
  Administered 2022-04-26 – 2022-04-27 (×2): 20 mg via ORAL
  Filled 2022-04-26 (×2): qty 2

## 2022-04-26 MED ORDER — INSULIN ASPART 100 UNIT/ML IJ SOLN
0.0000 [IU] | Freq: Three times a day (TID) | INTRAMUSCULAR | Status: DC
  Administered 2022-04-26 – 2022-04-27 (×3): 3 [IU] via SUBCUTANEOUS

## 2022-04-26 MED ORDER — METHYLPREDNISOLONE SODIUM SUCC 125 MG IJ SOLR
125.0000 mg | Freq: Once | INTRAMUSCULAR | Status: AC
  Administered 2022-04-26: 125 mg via INTRAVENOUS
  Filled 2022-04-26: qty 2

## 2022-04-26 MED ORDER — FUROSEMIDE 10 MG/ML IJ SOLN
40.0000 mg | Freq: Once | INTRAMUSCULAR | Status: AC
  Administered 2022-04-26: 40 mg via INTRAVENOUS
  Filled 2022-04-26: qty 4

## 2022-04-26 MED ORDER — POTASSIUM CHLORIDE CRYS ER 20 MEQ PO TBCR
40.0000 meq | EXTENDED_RELEASE_TABLET | Freq: Once | ORAL | Status: AC
  Administered 2022-04-26: 40 meq via ORAL
  Filled 2022-04-26: qty 2

## 2022-04-26 NOTE — Progress Notes (Signed)
RNCM met with pt at bedside regarding broken DME CPAP.  Pt admitted that she has not contacted DME company [facility]) for repairs or replacement.  RNCM contacted ADAP rep [place]ella [place]) to follow up with pt at discharge.

## 2022-04-26 NOTE — ED Notes (Signed)
PT family member requested "something to cover a cut". When present nurse look in room pt feet were in [place]. Family member was "doing her feet and the scrubber did it". Mepilex border was given to pt family member, she stated "I know how to doit"

## 2022-04-26 NOTE — H&P (Addendum)
NAME:  Sharon Vasquez, [name]:  [id number], DOB:  11-19-1950, LOS: 0 ADMISSION DATE:  04/26/2022, Primary: [name], DO  CHIEF COMPLAINT:  dyspnea   Medical Service: Internal Medicine Teaching Service         Attending Physician: Dr. Aldine Contes, MD    First Contact: Dr. Carin Primrose Pager: [phone]  Second Contact: Dr. Raymondo Band Pager: [phone]       After Hours (After 5p/  First Contact Pager: [phone]  weekends / holidays): Second Contact Pager: Sharon Vasquez   Sharon Vasquez is 72yo person with persistent atrial fibrillation on anticoagulation, chronic hypoxic respiratory failure 2/2 COPD on 2L supplemental oxygen at home, chronic diastolic heart failure, chronic venous insufficiency, obstructive sleep apnea, GERD, obesity class III presenting to [facility] for acute onset dyspnea. Sharon Vasquez reports she was in her normal state of health until she woke up early this AM acutely short of breath. She tried getting to her manual wheelchair but was too dyspneic and called EMS. Prior to this, denies dyspnea, chest pain, cough, abdominal pain, nausea, vomiting, fevers, chills. She reports her CPAP machine has not been working properly for the last few days. States she has taken all of her medications as prescribed. No significant changes in diet or fluid intake. Was around friends on a cruise in December when they had COVID-19, but no sick contacts within the last few weeks.   PCP: [name], DO  ED COURSE   Upon arrival of EMS, BP 213/106 and patient sating 84% on room air. She was given albuterol and atrovent en route with mild improvement of symptoms. Upon arrival to ED, patient received '125mg'$  solumedrol. Chest x-ray on arrival revealed pulmonary edema and IV nitroglycerin was promptly initiated. IV lasix was also ordered and IMTS was consulted for admission.   PAST MEDICAL HISTORY   She,  has a past medical history of A-fib ([place]), Acute hypoxemic respiratory failure  ([place]) (02/20/2022), Acute on chronic hypoxic respiratory failure ([place]) (01/04/2022), Acute upper respiratory infection (01/08/2022), Anxiety, Arthritis, Atrial fibrillation ([place]), Benign breast cyst in female, left (01/08/2017), Breast cancer ([place]), Cellulitis of left lower leg (05/30/2017), CHF (congestive heart failure) ([place]), Chronic low back pain (08/21/2016), Chronic lower back pain, Chronic venous insufficiency, COPD (chronic obstructive pulmonary disease) ([place]), Depression, Diabetes ([place]) (01/08/2022), Diabetes mellitus without complication ([place]), DVT (deep venous thrombosis) ([place]) (11/16/2016), Dysrhythmia, Esophageal dysmotility (11/10/2019), GERD (gastroesophageal reflux disease), Headache, History of pulmonary embolism, Hyperlipidemia, Hypertension, Laryngopharyngeal reflux (12/18/2015), Morbid obesity ([place]), PE (pulmonary embolism), Pulmonary embolism ([place]) (09/21/2014), Sleep apnea, and Stroke ([place]) (12/02/2021).   HOME MEDICATIONS   Prior to Admission medications   Medication Sig Start Date End Date Taking? Authorizing Provider  albuterol (VENTOLIN HFA) 108 (90 Base) MCG/ACT inhaler Inhale 2 puffs into the lungs every 4 (four) hours as needed for shortness of breath. 08/25/21  Yes Timothy Lasso, MD  aspirin EC 81 MG tablet Take 81 mg by mouth daily. 12/15/20  Yes [provider]  atorvastatin (LIPITOR) 80 MG tablet TAKE 1 TABLET BY MOUTH EVERY DAY Patient taking differently: Take 80 mg by mouth daily. 12/02/21  Yes Atway, Rayann N, DO  empagliflozin (JARDIANCE) 10 MG TABS tablet Take 1 tablet (10 mg total) by mouth daily before breakfast. 02/22/22 05/23/22 Yes Patel, Amar, DO  esomeprazole (NEXIUM) 40 MG capsule Take 1 capsule (40 mg total) by mouth daily as needed (for heartburn or indigestion). Patient taking differently: Take 40 mg by mouth daily. 01/20/19  Yes Marcelyn Bruins, MD  gabapentin (NEURONTIN) 400 MG capsule TAKE 1 CAPSULE BY MOUTH  TWICE DAILY Patient taking  differently: Take 400 mg by mouth at bedtime. 10/05/21  Yes Atway, Rayann N, DO  ibuprofen (ADVIL) 200 MG tablet Take 800 mg by mouth daily as needed for moderate pain.   Yes [provider]  letrozole ([place]) 2.5 MG tablet TAKE 1 TABLET(2.5 MG) BY MOUTH DAILY Patient taking differently: Take 2.5 mg by mouth daily. 11/28/21  Yes Nicholas Lose, MD  LORazepam (ATIVAN) 0.5 MG tablet Take 1 tablet (0.5 mg total) by mouth every 8 (eight) hours as needed for anxiety. 03/15/22  Yes Johny Blamer, DO  losartan (COZAAR) 25 MG tablet Take 1 tablet (25 mg total) by mouth daily. 02/22/22 02/22/23 Yes Patel, Hyman Hopes, DO  metoprolol succinate (TOPROL-XL) 25 MG 24 hr tablet TAKE 1 TABLET BY MOUTH DAILY 02/09/22  Yes Virl Axe, MD  MYRBETRIQ 50 MG TB24 tablet TAKE 1 TABLET BY MOUTH DAILY 01/25/22  Yes Gaylan Gerold, DO  naloxone [facility]) nasal spray 4 mg/0.1 mL Place 1 spray into the nose once as needed (for overdose suspected). for opioid overdose 11/02/21  Yes [provider]  oxyCODONE (ROXICODONE) 15 MG immediate release tablet Take 15 mg by mouth every 6 (six) hours as needed for pain. 08/05/20  Yes [provider]  potassium chloride SA (KLOR-CON M) 20 MEQ tablet TAKE 1 TABLET BY MOUTH DAILY 12/29/21  Yes Atway, Rayann N, DO  PRESCRIPTION MEDICATION Inhale into the lungs at bedtime. cpap   Yes [provider]  rivaroxaban (XARELTO) 20 MG TABS tablet Take 1 tablet (20 mg total) by mouth every morning. 02/13/22  Yes Virl Axe, MD  Semaglutide,0.25 or 0.'5MG'$ /DOS, 2 MG/3ML SOPN Inject 0.5 Units into the skin once a week. 04/13/22  Yes Nani Gasser, MD  torsemide (DEMADEX) 20 MG tablet Take 2 tablets by mouth daily, you may take an extra 1/2 tablet only as needed for weight gain of 2 lbs overnight or 5 lbs in a week Patient taking differently: Take 20 mg by mouth daily as needed (for legs fluid). 12/02/21  Yes Masters, Katie, DO  TRELEGY ELLIPTA 100-62.5-25 MCG/ACT AEPB INHALE 1  PUFF INTO THE LUNGS DAILY 11/16/21  Yes Rigoberto Noel, MD  ipratropium-albuterol (DUONEB) 0.5-2.5 (3) MG/3ML SOLN Take 3 mLs by nebulization every 6 (six) hours as needed. Patient taking differently: Take 3 mLs by nebulization every 6 (six) hours as needed (shortness of breath). 06/18/20   Martyn Ehrich, NP  OXYGEN Inhale 2-3 L into the lungs daily.    [provider]    ALLERGIES   Allergies as of 04/26/2022   (No Known Allergies)    SOCIAL HISTORY   Patient reports she lives alone in a house. She typically has difficulties with some ADL's including bathing. Also has some difficulties with cooking, getting around the house. She is a previous smoker, quit in October after years of smoking. Denies alcohol or recreational drug use.   FAMILY HISTORY   Her family history includes Breast cancer in her mother; Hyperlipidemia in her maternal grandfather and mother; Hypertension in her maternal grandfather and mother.   REVIEW OF SYSTEMS   ROS per history of present illness.  PHYSICAL EXAMINATION   Blood pressure (!) 156/85, pulse 91, temperature 98.1 F (36.7 C), temperature source Oral, resp. rate (!) 22, height '6\' 2"'$  (1.88 m), weight (!) 167.8 kg, SpO2 98 %.    Filed Weights   04/26/22  0453  Weight: (!) 167.8 kg   GENERAL: Tired appearing person in no acute distress HENT: Normocephalic, atraumatic. Moist mucous membranes. EYES: Vision grossly in tact. No scleral icterus or conjunctival injection.  CV: Regular rate, irregular rhythm. No murmurs appreciated. Warm extremities. PULM: Normal work of breathing on 3L nasal cannula. Mild bibasilar rales and mild wheezing throughout bilateral lung fields. GI: Abdomen soft, non-tender, non-distended. Normoactive bowel sounds. MSK: Normal bulk, tone. Non-pitting peripheral edema to knees bilaterally. SKIN: Warm, dry. Bilateral blisters appreciated on anterior lower legs, none open. NEURO: Awake, alert, conversing appropriately.  No gross deficits appreciated. PSYCH: Normal mood, affect, speech.  SIGNIFICANT DIAGNOSTIC TESTS   ECG: Atrial fibrillation, rate controlled. Left axis deviation. Overall similar to previous ECG, no acute ST changes.  I personally reviewed patient's ECG with my interpretation as above.  CXR: Increased vascular congestion, especially bibasilar lung spaces. No overt effusions appreciated.   I personally reviewed patient's CXR with my interpretation as above.  LABS      Latest Ref Rng & Units 04/26/2022    6:22 AM 03/28/2022    6:31 AM 02/21/2022   12:05 AM  CBC  WBC 4.0 - 10.5 K/uL 6.7  6.9  6.7   Hemoglobin 12.0 - 15.0 g/dL 12.1  11.5  12.2   Hematocrit 36.0 - 46.0 % 36.8  34.2  35.9   Platelets 150 - 400 K/uL 451  400  404       Latest Ref Rng & Units 04/26/2022    6:22 AM 03/28/2022    6:31 AM 02/22/2022    3:20 AM  BMP  Glucose 70 - 99 mg/dL 113  112  95   BUN 8 - 23 mg/dL '10  10  12   '$ Creatinine 0.44 - 1.00 mg/dL 0.91  0.94  0.84   Sodium 135 - 145 mmol/L 141  143  141   Potassium 3.5 - 5.1 mmol/L 3.3  3.6  3.7   Chloride 98 - 111 mmol/L 104  111  107   CO2 22 - 32 mmol/L '26  25  23   '$ Calcium 8.9 - 10.3 mg/dL 9.0  8.9  8.9     CONSULTS   N/a  ASSESSMENT   Simranjit Thayer is 72yo person with persistent atrial fibrillation on anticoagulation, chronic hypoxic respiratory failure 2/2 COPD on 2L supplemental oxygen at home, chronic diastolic heart failure, chronic venous insufficiency, obstructive sleep apnea, GERD, obesity class III admitted with acute severe symptomatic hypertension.  PLAN   Principal Problem:   Hypertensive emergency  #Acute severe symptomatic hypertension #Chronic diastolic heart failure #Obstructive sleep apnea Patient initially presented with MAP 142 with findings of pulmonary edema after sudden-onset dyspnea in setting of no CPAP therapy. Symptoms were acute in onset, she was in her usual state of health until early this morning. She does  have some mild wheezing on exam, however history most consistent with acute severe symptomatic hypertension. She is not having other symptoms concerning for infectious cause. Nitroglycerin gtt has been initiated by ED, MAP goal 120 today, afterward can normalize. Will continue with nitro gtt today, hopefully can transition off later today/tomorrow AM. Also giving IV lasix '40mg'$ . Will put in [facility] consult as well to see if we can help her with CPAP machine once she is ready for discharge.  - Continue nitro gtt, MAP goal 120 - Hold home losartan, metoprolol, torsemide for now - Likely can re-start oral therapy this evening/tomorrow AM - IV lasix '40mg'$  x1 this morning -  TOC consult for CPAP machine assistance - Strict I/O  #Chronic respiratory failure 2/2 COPD Patient did receive Atrovent and Solumedrol this morning, however I do not believe she is in an exacerbation. Will hold off further steroids, continue with her home regimen.  - Breo, Incruse daily - DuoNeb q4h PRN - Hold further steroids - SpO2 goal 92%  #Persistent atrial fibrillation Currently rate-controlled. Holding oral BP meds currently. Likely can re-start later today vs tomorrow AM. - Hold home metoprolol - Continue home Xarelto '20mg'$  daily  #Hypokalemia K 3.3 on arrival. Will replete w/ oral supplementation, check Mg in AM. - KlorCon 86mq  - Repeat BMP, Mg in AM  #Prediabetes Last A1c 5.8% in September. Will repeat here. Glucose controlled at 113. - Repeat A1c - SSI  #Chronic pain syndrome - Gabapentin '400mg'$  QHS - Oxycodone '15mg'$  q6h PRN  BEST PRACTICE   DIET: HH IVF: n/a DVT PPX: xarelto BOWEL: n/a CODE: FULL FAM COM: n/a  DISPO: Admit patient to Inpatient with expected length of stay greater than 2 midnights.  PSanjuan Dame MD Internal Medicine Resident PGY-3 Pager #[phone]/24/24 8:11 AM

## 2022-04-26 NOTE — ED Notes (Signed)
Shift report received, assumed care of patient at this time 

## 2022-04-26 NOTE — ED Provider Notes (Signed)
[facility] Provider Note   CSN: [id number] Arrival date & time: 04/26/22  0434     History  Chief Complaint  Patient presents with   Shortness of Breath   Level 5 caveat due to acuity of condition Sharon Vasquez is a 72 y.o. female.  HPI Patient with extensive medical history including COPD, CHF with preserved ejection fraction, obesity presents with shortness of breath Patient woke up short of breath, is not on oxygen at home but does use CPAP at night but has broken.  EMS reports patient was hypoxic to 83% on room air.  Patient was given albuterol, Atrovent and route as well as placed on CPAP with some improvement    Home Medications Prior to Admission medications   Medication Sig Start Date End Date Taking? Authorizing Provider  albuterol (VENTOLIN HFA) 108 (90 Base) MCG/ACT inhaler Inhale 2 puffs into the lungs every 4 (four) hours as needed for shortness of breath. 08/25/21  Yes Timothy Lasso, MD  aspirin EC 81 MG tablet Take 81 mg by mouth daily. 12/15/20  Yes [provider]  atorvastatin (LIPITOR) 80 MG tablet TAKE 1 TABLET BY MOUTH EVERY DAY Patient taking differently: Take 80 mg by mouth daily. 12/02/21  Yes Atway, Rayann N, DO  empagliflozin (JARDIANCE) 10 MG TABS tablet Take 1 tablet (10 mg total) by mouth daily before breakfast. 02/22/22 05/23/22 Yes Patel, Amar, DO  esomeprazole (NEXIUM) 40 MG capsule Take 1 capsule (40 mg total) by mouth daily as needed (for heartburn or indigestion). Patient taking differently: Take 40 mg by mouth daily. 01/20/19  Yes Marcelyn Bruins, MD  gabapentin (NEURONTIN) 400 MG capsule TAKE 1 CAPSULE BY MOUTH  TWICE DAILY Patient taking differently: Take 400 mg by mouth at bedtime. 10/05/21  Yes Atway, Rayann N, DO  ibuprofen (ADVIL) 200 MG tablet Take 800 mg by mouth daily as needed for moderate pain.   Yes [provider]  letrozole ([place]) 2.5 MG tablet TAKE 1 TABLET(2.5 MG)  BY MOUTH DAILY Patient taking differently: Take 2.5 mg by mouth daily. 11/28/21  Yes Nicholas Lose, MD  LORazepam (ATIVAN) 0.5 MG tablet Take 1 tablet (0.5 mg total) by mouth every 8 (eight) hours as needed for anxiety. 03/15/22  Yes Johny Blamer, DO  losartan (COZAAR) 25 MG tablet Take 1 tablet (25 mg total) by mouth daily. 02/22/22 02/22/23 Yes Patel, Hyman Hopes, DO  metoprolol succinate (TOPROL-XL) 25 MG 24 hr tablet TAKE 1 TABLET BY MOUTH DAILY 02/09/22  Yes Virl Axe, MD  MYRBETRIQ 50 MG TB24 tablet TAKE 1 TABLET BY MOUTH DAILY 01/25/22  Yes Gaylan Gerold, DO  naloxone [facility]) nasal spray 4 mg/0.1 mL Place 1 spray into the nose once as needed (for overdose suspected). for opioid overdose 11/02/21  Yes [provider]  oxyCODONE (ROXICODONE) 15 MG immediate release tablet Take 15 mg by mouth every 6 (six) hours as needed for pain. 08/05/20  Yes [provider]  potassium chloride SA (KLOR-CON M) 20 MEQ tablet TAKE 1 TABLET BY MOUTH DAILY 12/29/21  Yes Atway, Rayann N, DO  PRESCRIPTION MEDICATION Inhale into the lungs at bedtime. cpap   Yes [provider]  rivaroxaban (XARELTO) 20 MG TABS tablet Take 1 tablet (20 mg total) by mouth every morning. 02/13/22  Yes Virl Axe, MD  Semaglutide,0.25 or 0.'5MG'$ /DOS, 2 MG/3ML SOPN Inject 0.5 Units into the skin once a week. 04/13/22  Yes Nani Gasser, MD  torsemide (DEMADEX) 20 MG tablet  Take 2 tablets by mouth daily, you may take an extra 1/2 tablet only as needed for weight gain of 2 lbs overnight or 5 lbs in a week Patient taking differently: Take 20 mg by mouth daily as needed (for legs fluid). 12/02/21  Yes Masters, Katie, DO  TRELEGY ELLIPTA 100-62.5-25 MCG/ACT AEPB INHALE 1 PUFF INTO THE LUNGS DAILY 11/16/21  Yes Rigoberto Noel, MD  ipratropium-albuterol (DUONEB) 0.5-2.5 (3) MG/3ML SOLN Take 3 mLs by nebulization every 6 (six) hours as needed. Patient taking differently: Take 3 mLs by nebulization every 6 (six) hours as  needed (shortness of breath). 06/18/20   Martyn Ehrich, NP  OXYGEN Inhale 2-3 L into the lungs daily.    [provider]      Allergies    Patient has no known allergies.    Review of Systems   Review of Systems  Unable to perform ROS: Acuity of condition    Physical Exam Updated Vital Signs BP (!) 156/85   Pulse 91   Temp 98.1 F (36.7 C) (Oral)   Resp (!) 22   Ht 1.88 m ('6\' 2"'$ )   Wt (!) 167.8 kg   SpO2 98%   BMI 47.51 kg/m  Physical Exam CONSTITUTIONAL: Ill-appearing HEAD: Normocephalic/atraumatic EYES: EOMI/PERRL ENMT: Mucous membranes moist, CPAP mask in place NECK: supple no meningeal signs CV: S1/S2 noted LUNGS: Tachypnea, wheezing and crackles bilaterally ABDOMEN: soft, nontender, obese NEURO: Pt is awake/alert/appropriate, moves all extremitiesx4.  No facial droop.   EXTREMITIES: pulses normal/equal, significant chronic lower extremity edema noted bilaterally SKIN: warm, color normal PSYCH: Unable to assess  ED Results / Procedures / Treatments   Labs (all labs ordered are listed, but only abnormal results are displayed) Labs Reviewed  CBC - Abnormal; Notable for the following components:      Result Value   Platelets 451 (*)    All other components within normal limits  RESP PANEL BY RT-PCR (RSV, FLU A&B, COVID)  RVPGX2  BASIC METABOLIC PANEL  BRAIN NATRIURETIC PEPTIDE    EKG EKG Interpretation  Date/Time:  Wednesday April 26 2022 04:57:32 EST Ventricular Rate:  91 PR Interval:    QRS Duration: 109 QT Interval:  384 QTC Calculation: 473 R Axis:   -30 Text Interpretation: Atrial fibrillation Left axis deviation Abnormal R-wave progression, early transition Interpretation limited secondary to artifact Confirmed by Ripley Fraise [phone]) on 04/26/2022 5:02:22 AM  Prehospital EKG reviewed which reveals atrial fibrillation with rapid ventricular response with a heart rate of 109  Radiology DG Chest Port 1 View  Result Date:  04/26/2022 CLINICAL DATA:  Shortness of breath. EXAM: PORTABLE CHEST 1 VIEW COMPARISON:  AP Lat 03/28/2022 FINDINGS: Increased moderate to severe cardiomegaly. There is interval worsened vascular prominence and generalized interstitial edema with a basal gradient. Small pleural effusions have also formed as well as patchy opacities in the lower lung fields, which could be alveolar edema, pneumonia or combination. The mediastinal configuration is stable. There is aortic atherosclerosis. The thoracic cage intact. IMPRESSION: 1. Worsening cardiomegaly, vascular engorgement and interstitial edema pattern. 2. Small pleural effusions. 3. Patchy opacities developed in the lower lung fields, which could be alveolar edema, pneumonia or combination. 4. Aortic atherosclerosis. Electronically Signed   By: Telford Nab M.D.   On: 04/26/2022 05:25    Procedures .Critical Care  Performed by: Ripley Fraise, MD Authorized by: Ripley Fraise, MD   Critical care provider statement:    Critical care time (minutes):  43   Critical care  start time:  04/26/2022 4:47 AM   Critical care end time:  04/26/2022 5:30 AM   Critical care time was exclusive of:  Separately billable procedures and treating other patients   Critical care was necessary to treat or prevent imminent or life-threatening deterioration of the following conditions:  Respiratory failure and cardiac failure   Critical care was time spent personally by me on the following activities:  Obtaining history from patient or surrogate, development of treatment plan with patient or surrogate, examination of patient, re-evaluation of patient's condition, pulse oximetry, ordering and review of radiographic studies, ordering and review of laboratory studies, ordering and performing treatments and interventions, review of old charts and evaluation of patient's response to treatment   I assumed direction of critical care for this patient from another provider in my  specialty: no     Care discussed with: admitting provider       Medications Ordered in ED Medications  nitroGLYCERIN 50 mg in dextrose 5 % 250 mL (0.2 mg/mL) infusion (5 mcg/min Intravenous New Bag/Given 04/26/22 0639)  furosemide (LASIX) injection 40 mg (has no administration in time range)  albuterol (PROVENTIL) (2.5 MG/3ML) 0.083% nebulizer solution (10 mg/hr Nebulization Given 04/26/22 0508)  methylPREDNISolone sodium succinate (SOLU-MEDROL) 125 mg/2 mL injection 125 mg (125 mg Intravenous Given 04/26/22 0636)    ED Course/ Medical Decision Making/ A&P Clinical Course as of 04/26/22 0715  Wed Apr 26, 2022  0452 Seen on arrival already on CPAP/noninvasive ventilation.  Will trial off of CPAP but given nebulized treatments.  Patient will need to be admitted [DW]  0536 X-ray consistent with pulmonary edema.  Patient has elevated blood pressure.  Will start nitroglycerin drip, will give Lasix and admit.  Patient has been stabilized, no longer requiring noninvasive ventilation [DW]  0656 Patient feeling improved.  Blood pressure is improving.  Will call for admission [DW]  0715 Discussed with internal medicine residency for admission. [DW]    Clinical Course User Index [DW] Ripley Fraise, MD                             Medical Decision Making Amount and/or Complexity of Data Reviewed Labs: ordered. Radiology: ordered.  Risk Prescription drug management. Decision regarding hospitalization.   This patient presents to the ED for concern of shortness of breath, this involves an extensive number of treatment options, and is a complaint that carries with it a high risk of complications and morbidity.  The differential diagnosis includes but is not limited to Acute coronary syndrome, pneumonia, acute pulmonary edema, pneumothorax, acute anemia, pulmonary embolism    Comorbidities that complicate the patient evaluation: Patient's presentation is complicated by their history of COPD and  CHF  Social Determinants of Health: Patient's  limited mobility   increases the complexity of managing their presentation  Additional history obtained: Additional history obtained from EMS discussed with paramedic at the bedside Records reviewed  internal medicine clinic notes reviewed  Lab Tests: I Ordered, and personally interpreted labs.  The pertinent results include: No significant findings  Imaging Studies ordered: I ordered imaging studies including X-ray chest    I independently visualized and interpreted imaging which showed pulmonary edema I agree with the radiologist interpretation  Cardiac Monitoring: The patient was maintained on a cardiac monitor.  I personally viewed and interpreted the cardiac monitor which showed an underlying rhythm of:  Atrial Fibrillation  Medicines ordered and prescription drug management: I ordered medication including  albuterol nebulizer for wheezing Reevaluation of the patient after these medicines showed that the patient    improved   Critical Interventions:  IV nitroglycerin  Consultations Obtained: I requested consultation with the admitting physician internal medicine residency , and discussed  findings as well as pertinent plan - they recommend: Will admit  Reevaluation: After the interventions noted above, I reevaluated the patient and found that they have :improved  Complexity of problems addressed: Patient's presentation is most consistent with  acute presentation with potential threat to life or bodily function  Disposition: After consideration of the diagnostic results and the patient's response to treatment,  I feel that the patent would benefit from admission   .           Final Clinical Impression(s) / ED Diagnoses Final diagnoses:  Acute respiratory failure with hypoxia ([place])  Acute pulmonary edema [facility])    Rx / DC Orders ED Discharge Orders     None         Ripley Fraise, MD 04/26/22 [phone]

## 2022-04-26 NOTE — ED Triage Notes (Signed)
Pt BIB EMS from home. Per EMS, pt woke up this morning with SOB. Hx of COPD and HTN. '10mg'$  albuterol and 0.5 of atrovent given en route. Cpap en route. A/O x 4

## 2022-04-26 NOTE — ED Notes (Signed)
Per EMS, pt 82% on RA

## 2022-04-26 NOTE — ED Notes (Signed)
This RN made another attempt to get an IV at this time. Unsuccessful. IV team consult order previously placed.

## 2022-04-26 NOTE — Progress Notes (Signed)
Transition of Care [facility]) - Emergency Department Mini Assessment   Patient Details  Name: Sharon Vasquez [name]: [id number] Date of Birth: 06/24/1950  Transition of Care [facility]) CM/SW Contact:    Fuller Mandril, RN Phone Number: 04/26/2022, 10:02 AM   Clinical Narrative: RNCM met with pt at bedside regarding broken DME CPAP. Pt admitted that she has not contacted DME company [facility]) for repairs or replacement. RNCM contacted ADAP rep Suella Grove) to follow up with pt at discharge.    ED Mini Assessment: What brought you to the Emergency Department? : Short of Breath  Barriers to Discharge: Continued Medical Work up             Patient Contact and Communications        ,          Patient states their goals for this hospitalization and ongoing recovery are:: control my breathing and get someone to fix my CPAP machine      Admission diagnosis:  Hypertensive emergency [I16.1] Patient Active Problem List   Diagnosis Date Noted   Hypertensive emergency 04/26/2022   Anxiety attack 03/17/2022   Prediabetes 04/09/2021   Goals of care, counseling/discussion 03/23/2021   Malignant neoplasm of upper-outer quadrant of right breast in female, estrogen receptor positive ([place]) 03/21/2021   Right foot pain 03/03/2021   (HFpEF) heart failure with preserved ejection fraction ([place])    Osteoarthritis of left hip 10/07/2020   Venous stasis ulcer of left lower extremity ([place]) 08/18/2020   Hyperlipidemia 05/14/2020   Chronic respiratory failure with hypoxia ([place]) 09/30/2019   Urinary incontinence 08/21/2019   GERD (gastroesophageal reflux disease) 01/20/2019   OSA (obstructive sleep apnea) 11/14/2017   Chronic pain syndrome 07/27/2017   Major depression, recurrent, chronic ([place]) 02/01/2017   Osteoporosis 12/14/2016   Aortic atherosclerosis ([place]) 07/17/2016   Chronic venous insufficiency 07/12/2016   Chronic anticoagulation 07/12/2016   Tobacco use 07/11/2016   COPD  (chronic obstructive pulmonary disease) (HCC)    Diastolic heart failure (HCC)    Atrial fibrillation with controlled ventricular response (HCC)    Vocal cord polyp 12/18/2015   Morbid obesity ([place])    Peripheral vascular disease ([place])    Benign essential HTN    Primary osteoarthritis of both knees 09/16/2014   PCP:  [name], DO Pharmacy:   [id number] [address], [place] - [address] AT [address] [place] [address] Phone: [phone] Fax: [phone]

## 2022-04-26 NOTE — ED Notes (Signed)
IV team RN at bedside.  

## 2022-04-27 ENCOUNTER — Observation Stay (HOSPITAL_BASED_OUTPATIENT_CLINIC_OR_DEPARTMENT_OTHER): Payer: 59

## 2022-04-27 DIAGNOSIS — R0603 Acute respiratory distress: Secondary | ICD-10-CM

## 2022-04-27 DIAGNOSIS — I161 Hypertensive emergency: Secondary | ICD-10-CM | POA: Diagnosis not present

## 2022-04-27 DIAGNOSIS — I110 Hypertensive heart disease with heart failure: Secondary | ICD-10-CM | POA: Diagnosis not present

## 2022-04-27 DIAGNOSIS — I5030 Unspecified diastolic (congestive) heart failure: Secondary | ICD-10-CM | POA: Diagnosis not present

## 2022-04-27 DIAGNOSIS — Z87891 Personal history of nicotine dependence: Secondary | ICD-10-CM | POA: Diagnosis not present

## 2022-04-27 DIAGNOSIS — J9621 Acute and chronic respiratory failure with hypoxia: Secondary | ICD-10-CM | POA: Diagnosis not present

## 2022-04-27 LAB — ECHOCARDIOGRAM LIMITED
Area-P 1/2: 4.39 cm2
Height: 74 in
S' Lateral: 4.6 cm
Weight: 5920 oz

## 2022-04-27 LAB — URINALYSIS, ROUTINE W REFLEX MICROSCOPIC
Bilirubin Urine: NEGATIVE
Glucose, UA: 50 mg/dL — AB
Hgb urine dipstick: NEGATIVE
Ketones, ur: NEGATIVE mg/dL
Leukocytes,Ua: NEGATIVE
Nitrite: NEGATIVE
Protein, ur: NEGATIVE mg/dL
Specific Gravity, Urine: 1.019 (ref 1.005–1.030)
pH: 8 (ref 5.0–8.0)

## 2022-04-27 LAB — BASIC METABOLIC PANEL
Anion gap: 6 (ref 5–15)
BUN: 14 mg/dL (ref 8–23)
CO2: 28 mmol/L (ref 22–32)
Calcium: 8.6 mg/dL — ABNORMAL LOW (ref 8.9–10.3)
Chloride: 106 mmol/L (ref 98–111)
Creatinine, Ser: 0.87 mg/dL (ref 0.44–1.00)
GFR, Estimated: >60 mL/min (ref >60)
Glucose, Bld: 135 mg/dL — ABNORMAL HIGH (ref 70–99)
Potassium: 3.7 mmol/L (ref 3.5–5.1)
Sodium: 140 mmol/L (ref 135–145)

## 2022-04-27 LAB — HEMOGLOBIN A1C
Hgb A1c MFr Bld: 5.6 % (ref 4.8–5.6)
Mean Plasma Glucose: 114.02 mg/dL

## 2022-04-27 LAB — CBG MONITORING, ED
Glucose-Capillary: 117 mg/dL — ABNORMAL HIGH (ref 70–99)
Glucose-Capillary: 170 mg/dL — ABNORMAL HIGH (ref 70–99)

## 2022-04-27 LAB — MAGNESIUM: Magnesium: 1.8 mg/dL (ref 1.7–2.4)

## 2022-04-27 NOTE — Discharge Instructions (Addendum)
[name]  You were treated in the hospital for shortness of breath. Your symptoms were probably caused by a combination of high blood pressure and congestive heart failure. You were treated with blood pressure medicine, diuretics, and oxygen therapy We are discharging you now that you are doing better. To help assist you on your road to recovery, I have written the following recommendations:   I recommend restarting your Torsemide on a daily basis. This may help prevent future episodes of shortness of breath.  I recommend stopping lorazepam. This medicine can slow down your breathing, leading to increased shortness of breath.  Follow up with the doctors in the [facility] as soon as possible after discharge from the hospital.  It was a privilege to be a part of your hospital care team, and I hope you feel better as a result of your stay.  All the best, Nani Gasser, MD

## 2022-04-27 NOTE — Progress Notes (Signed)
  Echocardiogram 2D Echocardiogram has been performed.  Wynelle Link 04/27/2022, 12:20 PM

## 2022-04-27 NOTE — Hospital Course (Signed)
Principal Problem:   Hypertensive emergency  Resolved Problems:   * No resolved hospital problems. *  Consults:***  Procedures:***  Follow-up items:***  Feeling better today. Breathing back to normal. Wheezing but this is not unusual. Usually takes some Mucinex and drinks some coffee before moving on with her day.

## 2022-04-27 NOTE — ED Notes (Signed)
Patient denies pain and is resting comfortably.  

## 2022-04-27 NOTE — ED Notes (Signed)
Pt purewick changed out. Pt requested different tray pt requests eggs and sausage

## 2022-04-27 NOTE — ED Notes (Signed)
Echo called and pt will be next

## 2022-04-27 NOTE — Care Management Obs Status (Signed)
[facility] NOTIFICATION   Patient Details  Name: Sharon Vasquez [name]: [id number] Date of Birth: 04/18/1950   Medicare Observation Status Notification Given:  Yes    Fuller Mandril, RN 04/27/2022, 9:31 AM

## 2022-04-27 NOTE — ED Notes (Signed)
Pt updated pt will be d/c'ed home called family for ride and provider stated on way to see pt

## 2022-04-27 NOTE — Care Management CC44 (Signed)
Condition Code 44 Documentation Completed  Patient Details  Name: Sharon Vasquez [name]: [id number] Date of Birth: Jul 03, 1950   Condition Code 44 given:  Yes Patient signature on Condition Code 44 notice:  Yes Documentation of 2 MD's agreement:  Yes Code 44 added to claim:  Yes    Fuller Mandril, RN 04/27/2022, 9:31 AM

## 2022-04-27 NOTE — Discharge Summary (Signed)
Name: Sharon Vasquez [name]: [id number] DOB: 05/03/1950 72 y.o. PCP: [name], DO  Date of Admission: 04/26/2022  4:34 AM Date of Discharge: 04/27/2022 Attending Physician: No att. providers found  Discharge Diagnosis: 1. Active Problems:   * No active hospital problems. *   Discharge Medications: Allergies as of 04/27/2022   No Known Allergies      Medication List     STOP taking these medications    LORazepam 0.5 MG tablet Commonly known as: Ativan       TAKE these medications    albuterol 108 (90 Base) MCG/ACT inhaler Commonly known as: VENTOLIN HFA Inhale 2 puffs into the lungs every 4 (four) hours as needed for shortness of breath.   aspirin EC 81 MG tablet Take 81 mg by mouth daily.   atorvastatin 80 MG tablet Commonly known as: LIPITOR TAKE 1 TABLET BY MOUTH EVERY DAY What changed:  how much to take how to take this when to take this additional instructions   esomeprazole 40 MG capsule Commonly known as: NEXIUM Take 1 capsule (40 mg total) by mouth daily as needed (for heartburn or indigestion). What changed: when to take this   gabapentin 400 MG capsule Commonly known as: NEURONTIN TAKE 1 CAPSULE BY MOUTH  TWICE DAILY What changed: when to take this   ibuprofen 200 MG tablet Commonly known as: ADVIL Take 800 mg by mouth daily as needed for moderate pain.   ipratropium-albuterol 0.5-2.5 (3) MG/3ML Soln Commonly known as: DUONEB Take 3 mLs by nebulization every 6 (six) hours as needed. What changed: reasons to take this   Jardiance 10 MG Tabs tablet Generic drug: empagliflozin Take 1 tablet (10 mg total) by mouth daily before breakfast.   letrozole 2.5 MG tablet Commonly known as: FEMARA TAKE 1 TABLET(2.5 MG) BY MOUTH DAILY What changed:  how much to take how to take this when to take this additional instructions   losartan 25 MG tablet Commonly known as: Cozaar Take 1 tablet (25 mg total) by mouth daily.   metoprolol  succinate 25 MG 24 hr tablet Commonly known as: TOPROL-XL TAKE 1 TABLET BY MOUTH DAILY   Myrbetriq 50 MG Tb24 tablet Generic drug: mirabegron ER TAKE 1 TABLET BY MOUTH DAILY   naloxone 4 MG/0.1ML Liqd nasal spray kit Commonly known as: NARCAN Place 1 spray into the nose once as needed (for overdose suspected). for opioid overdose   oxyCODONE 15 MG immediate release tablet Commonly known as: ROXICODONE Take 15 mg by mouth every 6 (six) hours as needed for pain.   OXYGEN Inhale 2-3 L into the lungs daily.   potassium chloride SA 20 MEQ tablet Commonly known as: KLOR-CON M TAKE 1 TABLET BY MOUTH DAILY   PRESCRIPTION MEDICATION Inhale into the lungs at bedtime. cpap   rivaroxaban 20 MG Tabs tablet Commonly known as: Xarelto Take 1 tablet (20 mg total) by mouth every morning.   Semaglutide(0.25 or 0.'5MG'$ /DOS) 2 MG/3ML Sopn Inject 0.5 Units into the skin once a week.   torsemide 20 MG tablet Commonly known as: DEMADEX Take 2 tablets by mouth daily, you may take an extra 1/2 tablet only as needed for weight gain of 2 lbs overnight or 5 lbs in a week What changed:  how much to take how to take this when to take this reasons to take this additional instructions   Trelegy Ellipta 100-62.5-25 MCG/ACT Aepb Generic drug: Fluticasone-Umeclidin-Vilant INHALE 1 PUFF INTO THE LUNGS DAILY  Disposition and follow-up:   [name] was discharged from [facility] in Good condition.  At the hospital follow up visit please address:  1.  Acute Severe Symptomatic Hypertension Hx of HEFpEF Discussed option of using Unna boot for LE edema with frequent clinic follow-ups, consider if patient would be interested. Patient states she is only intermittently compliant with jardiance and torsemide, consider review of medication regimen. CPAP machine had broken shortly before this admission and patient said she is receiving a new one at home.   2. Urinary  frequency Patient endorsed foul smelling urine and increased urinary frequency during admission although UA was negative. Consider pelvic exam for possible vaginal infection vs. UTI.   2.  Labs / imaging needed at time of follow-up: None  3.  Pending labs/ test needing follow-up: None  Follow-up Appointments:  Follow-up Information     [facility]. Call.   Why: [facility], please call to set up time for them to service your CPAP Contact information: [place] [place] [address] [phone]         Starlyn Skeans, MD. Go on 05/04/2022.   Why: 10:15 AM appointment: please arrive 15 minutes before your appointment time Contact information: [place] [address] [phone]                 Hospital Course by problem list:  Acute on chronic hypoxic respiratory failure  HEFpEF exacerbation with severe symptomatic hypertension Patient presented with sudden onset dyspnea with elevated systolic BP to 767H and O2 sats to 80s on room air. CXR showed pulmonary edema. Echo showed EF 60-65% and no significant change from prior Echo in November. Patient was started on nitroglycerin drip in the ED and then transitioned to home losartan and metoprolol. Her respiratory status also improved and she remained stable on 2L [place] and her home inhalers.   Persistent atrial fibrillation  Patient restarted on home metoprolol after BP improved.   Prediabetes  A1c 5.6. Glucose remained well-controlled during admission.   Discharge Exam:   Feeling better. Breathing well. Reports strong desire to leave the hospital.  BP (!) 157/75   Pulse 83   Temp 97.7 F (36.5 C) (Temporal)   Resp 16   Ht '6\' 2"'$  (1.88 m)   Wt (!) 167.8 kg   SpO2 100%   BMI 47.51 kg/m   General: Alert, in NAD CV: Irregularly irregular rhythm, no murmurs, rubs, or gallops  Pulm: No respiratory distress, on 2L [place]. Coarse wheezing present on all lung fields bilaterally.  Skin: Warm and  dry Ext: 2+ LE edema Neuro: A&Ox4  Pertinent Labs, Studies, and Procedures:      Latest Ref Rng & Units 04/26/2022    6:22 AM 03/28/2022    6:31 AM 02/21/2022   12:05 AM  CBC  WBC 4.0 - 10.5 K/uL 6.7  6.9  6.7   Hemoglobin 12.0 - 15.0 g/dL 12.1  11.5  12.2   Hematocrit 36.0 - 46.0 % 36.8  34.2  35.9   Platelets 150 - 400 K/uL 451  400  404        Latest Ref Rng & Units 04/27/2022    4:07 AM 04/26/2022    6:22 AM 03/28/2022    6:31 AM  BMP  Glucose 70 - 99 mg/dL 135  113  112   BUN 8 - 23 mg/dL '14  10  10   '$ Creatinine 0.44 - 1.00 mg/dL 0.87  0.91  0.94   Sodium 135 - 145 mmol/L 140  141  143   Potassium 3.5 - 5.1 mmol/L 3.7  3.3  3.6   Chloride 98 - 111 mmol/L 106  104  111   CO2 22 - 32 mmol/L '28  26  25   '$ Calcium 8.9 - 10.3 mg/dL 8.6  9.0  8.9      Discharge Instructions: Discharge Instructions     (HEART FAILURE PATIENTS) Call MD:  Anytime you have any of the following symptoms: 1) 3 pound weight gain in 24 hours or 5 pounds in 1 week 2) shortness of breath, with or without a dry hacking cough 3) swelling in the hands, feet or stomach 4) if you have to sleep on extra pillows at night in order to breathe.   Complete by: As directed    Diet - low sodium heart healthy   Complete by: As directed    Increase activity slowly   Complete by: As directed         Signed: Spero Curb, Medical Student 04/27/2022, 1:47 PM   Pager: [phone]  Attestation for Student Documentation:  I personally was present and performed or re-performed the history, physical exam and medical decision-making activities of this service and have verified that the service and findings are accurately documented in the student's note.  Nani Gasser MD 04/27/2022, 4:06 PM

## 2022-04-28 ENCOUNTER — Telehealth: Payer: Self-pay

## 2022-04-28 DIAGNOSIS — M25561 Pain in right knee: Secondary | ICD-10-CM | POA: Diagnosis not present

## 2022-04-28 DIAGNOSIS — Z0000 Encounter for general adult medical examination without abnormal findings: Secondary | ICD-10-CM | POA: Diagnosis not present

## 2022-04-28 DIAGNOSIS — E1165 Type 2 diabetes mellitus with hyperglycemia: Secondary | ICD-10-CM | POA: Diagnosis not present

## 2022-04-28 DIAGNOSIS — G8929 Other chronic pain: Secondary | ICD-10-CM | POA: Diagnosis not present

## 2022-04-28 DIAGNOSIS — Z79899 Other long term (current) drug therapy: Secondary | ICD-10-CM | POA: Diagnosis not present

## 2022-04-28 DIAGNOSIS — M25562 Pain in left knee: Secondary | ICD-10-CM | POA: Diagnosis not present

## 2022-04-28 DIAGNOSIS — J439 Emphysema, unspecified: Secondary | ICD-10-CM | POA: Diagnosis not present

## 2022-04-28 DIAGNOSIS — E119 Type 2 diabetes mellitus without complications: Secondary | ICD-10-CM | POA: Diagnosis not present

## 2022-04-28 DIAGNOSIS — I10 Essential (primary) hypertension: Secondary | ICD-10-CM | POA: Diagnosis not present

## 2022-04-28 DIAGNOSIS — I509 Heart failure, unspecified: Secondary | ICD-10-CM | POA: Diagnosis not present

## 2022-04-28 DIAGNOSIS — R030 Elevated blood-pressure reading, without diagnosis of hypertension: Secondary | ICD-10-CM | POA: Diagnosis not present

## 2022-04-28 NOTE — Patient Outreach (Signed)
  Care Coordination TOC Note Transition Care Management Follow-up Telephone Call Date of discharge and from where: [name] 04[facility]27[facility]22 How have you been since you were released from the hospital? "I am feeling better than before I went to the hospital" Any questions or concerns? No  Items Reviewed: Did the pt receive and understand the discharge instructions provided? Yes  Medications obtained and verified? Yes  Other? No  Any new allergies since your discharge? No  Dietary orders reviewed? Yes Do you have support at home? Yes   Home Care and Equipment/Supplies: Were home health services ordered? no If so, what is the name of the agency? N/A  Has the agency set up a time to come to the patient's home? not applicable Were any new equipment or medical supplies ordered?  No What is the name of the medical supply agency? N/a Were you able to get the supplies/equipment? not applicable Do you have any questions related to the use of the equipment or supplies? No  Functional Questionnaire: (I = Independent and D = Dependent) ADLs: I  Bathing/Dressing- I  Meal Prep- I  Eating- I  Maintaining continence- I  Transferring/Ambulation- I  Managing Meds- I  Follow up appointments reviewed:  PCP Hospital f/u appt confirmed? Yes  Scheduled to see Dr. Alton Revere on 05/04/22 @ 10:15. [facility] f/u appt confirmed? No   Are transportation arrangements needed? No  If their condition worsens, is the pt aware to call PCP or go to the Emergency Dept.? Yes Was the patient provided with contact information for the PCP's office or ED? Yes Was to pt encouraged to call back with questions or concerns? Yes  SDOH assessments and interventions completed:   Yes SDOH Interventions Today    Flowsheet Row Most Recent Value  SDOH Interventions   Food Insecurity Interventions Intervention Not Indicated  Housing Interventions Intervention Not Indicated      Interventions Today    Flowsheet Row  Most Recent Value  Chronic Disease Discussed/Reviewed   Chronic disease discussed/reviewed during today's visit Diabetes, Congestive Heart Failure (CHF), Hypertension (HTN), Atrial Fibrillation (AFib)  General Adult Interventions   General Interventions Discussed/Reviewed General Interventions Discussed, Doctor Visits  Doctor Visits Discussed/Reviewed Doctor Visits Discussed  Pharmacy Interventions   Pharmacy Dicussed/Reviewed Medications and their functions  Safety Interventions   Safety Discussed/Reviewed Safety Discussed        Care Coordination Interventions:  No Care Coordination interventions needed at this time.   Encounter Outcome:  Pt. Visit Completed

## 2022-05-02 DIAGNOSIS — Z79899 Other long term (current) drug therapy: Secondary | ICD-10-CM | POA: Diagnosis not present

## 2022-05-04 ENCOUNTER — Ambulatory Visit (INDEPENDENT_AMBULATORY_CARE_PROVIDER_SITE_OTHER): Payer: 59

## 2022-05-04 VITALS — BP 135/91 | HR 61 | Temp 97.9°F | Ht 74.0 in

## 2022-05-04 DIAGNOSIS — Z87891 Personal history of nicotine dependence: Secondary | ICD-10-CM | POA: Diagnosis not present

## 2022-05-04 DIAGNOSIS — I5032 Chronic diastolic (congestive) heart failure: Secondary | ICD-10-CM | POA: Diagnosis not present

## 2022-05-04 DIAGNOSIS — R7303 Prediabetes: Secondary | ICD-10-CM | POA: Diagnosis not present

## 2022-05-04 DIAGNOSIS — R8290 Unspecified abnormal findings in urine: Secondary | ICD-10-CM

## 2022-05-04 DIAGNOSIS — J439 Emphysema, unspecified: Secondary | ICD-10-CM | POA: Diagnosis not present

## 2022-05-04 HISTORY — DX: Unspecified abnormal findings in urine: R82.90

## 2022-05-04 NOTE — Assessment & Plan Note (Signed)
Patient endorsed foul smelling urine and increased urinary frequency during her recent admission although UA was negative. Patient is currently taking Myrbetriq 50 mg daily for overactive bladder. Patient continues to have foul smelling and dark urine. Patient denies dysuria. Urinary frequency difficult to assess given her torsemide use.  Unlikely UTI however could perform pelvic exam to r/o yeast infection. Patient comfortable with pelvic exam at visit next week.   Plan: -  Pelvic exam for potential vaginal infection at next visit

## 2022-05-04 NOTE — Assessment & Plan Note (Signed)
Patient has a history of Pre-DM. Current medications include Jardiance 10 mg daily. Patient states that they are compliant with these medications. Patient does not check their blood sugar at home regularly. Polyuria is difficult to assess given her torsemide use. Patient denies polydipsia or fatigue. Patient states that they do visit the ophthalmologist for yearly eye exams. A1c was 5.6% one week ago.   Plan: - Continue Jardiance 10 mg daily - F/u ophthalmology for yearly eye exams

## 2022-05-04 NOTE — Patient Instructions (Addendum)
Thank you for coming to see [place] in clinic Sharon Vasquez.   Plan: - Please continue taking losartan 25 mg daily, metoprolol succinate 25 mg daily, torsemide 20 mg twice daily for your heart failure  - We will place unna boots at your next visit to help with your leg swelling (please return to clinic to have these exchanged)  - Please continue using your home oxygen, Trelegy Ellipta, albuterol, and breathing treatments at home  - Please schedule an appointment for next week so that we can perform a pelvic exam to assess for signs of vaginal infection  It was very nice to see you, thank you for allowing [place] to be involved in your care.   Please arrive to your next appointment 5-10 minutes before your scheduled appointment time, thank you!

## 2022-05-04 NOTE — Assessment & Plan Note (Signed)
Patient was discharged on 1/25 after being hospitalized for acute severe symptomatic HTN and acute on chronic hypoxic respiratory failure. Patient was prescribed torsemide 20 mg BID prior to her hospitalization. She reported intermittent compliance with this medication. She was discharged on home losartan 25 mg daily and metoprolol succinate 25 mg daily. Patient reports that she is compliant with these medications since her hospitalization. Patient has a history of HFpEF. Echo from 04/2022 demonstrates EF 60-65%. Patient states that they do not check their BP regularly at home. Patient has chronic LE swelling that she reports is unchanged over the last few months. Patient denies HA, lightheadedness, dizziness, CP, palpitations, or SOB. Initial BP today is 146/101. Repeat BP is 135/91.    Plan: - Continue losartan 25 mg daily, metoprolol succinate 25 mg daily, torsemide 20 mg twice daily - Will have Unna boots placed at her visit next week for LE edema w/ frequent clinic f/u

## 2022-05-04 NOTE — Progress Notes (Signed)
CC: hospital f/u visit  HPI:  [name] is a 72 y.o. female with past medical history of HTN, HLD, Pre-DM, HFpEF, a-fib, COPD, OSA, OA, GERD, chronic pain, and obesity that presents for a hospital f/u visit.   Patient was discharged on 1/25 after being hospitalized for acute severe symptomatic HTN and acute on chronic hypoxic respiratory failure. Patient was prescribed torsemide 20 mg BID prior to her hospitalization. She reported intermittent compliance with this medication. She was discharged on home losartan 25 mg daily and metoprolol succinate 25 mg daily. Patient reports that she is compliant with these medications since her hospitalization. Patient states that they do not check their BP regularly at home. Patient has chronic LE swelling that she reports is unchanged over the last few months. Patient denies HA, lightheadedness, dizziness, CP, palpitations, or SOB.   Patient has a history of COPD. She was sent home from her recent hospitalization on 2-3 L [place] and home albuterol, duonebs, and Trelegy Ellipta. She was on these medications and oxygen prior to hospitalization. Patient reports that they are compliant with these medications. Patient denies recent increase in use of these medications. Patient denies chest pain, SOB, difficulty breathing, lightheadedness, or dizziness.    Patient has a history of Pre-DM. Current medications include Jardiance 10 mg daily. Patient states that they are compliant with these medications. Patient does not check their blood sugar at home regularly. Polyuria is difficult to assess given her torsemide use. Patient denies polydipsia or fatigue. Patient states that they do visit the ophthalmologist for yearly eye exams.   Patient endorsed foul smelling urine and increased urinary frequency during her recent admission although UA was negative. Patient is currently taking Myrbetriq 50 mg daily for overactive bladder. Patient continues to have foul smelling and  dark urine. Patient denies dysuria. Urinary frequency difficult to assess given her torsemide use.   Allergies as of 05/04/2022   No Known Allergies      Medication List        Accurate as of May 04, 2022 10:53 AM. If you have any questions, ask your nurse or doctor.          albuterol 108 (90 Base) MCG/ACT inhaler Commonly known as: VENTOLIN HFA Inhale 2 puffs into the lungs every 4 (four) hours as needed for shortness of breath.   aspirin EC 81 MG tablet Take 81 mg by mouth daily.   atorvastatin 80 MG tablet Commonly known as: LIPITOR TAKE 1 TABLET BY MOUTH EVERY DAY What changed:  how much to take how to take this when to take this additional instructions   esomeprazole 40 MG capsule Commonly known as: NEXIUM Take 1 capsule (40 mg total) by mouth daily as needed (for heartburn or indigestion). What changed: when to take this   gabapentin 400 MG capsule Commonly known as: NEURONTIN TAKE 1 CAPSULE BY MOUTH  TWICE DAILY What changed: when to take this   ibuprofen 200 MG tablet Commonly known as: ADVIL Take 800 mg by mouth daily as needed for moderate pain.   ipratropium-albuterol 0.5-2.5 (3) MG/3ML Soln Commonly known as: DUONEB Take 3 mLs by nebulization every 6 (six) hours as needed. What changed: reasons to take this   Jardiance 10 MG Tabs tablet Generic drug: empagliflozin Take 1 tablet (10 mg total) by mouth daily before breakfast.   letrozole 2.5 MG tablet Commonly known as: [place] 1 TABLET(2.5 MG) BY MOUTH DAILY What changed:  how much to take how to take  this when to take this additional instructions   losartan 25 MG tablet Commonly known as: Cozaar Take 1 tablet (25 mg total) by mouth daily.   metoprolol succinate 25 MG 24 hr tablet Commonly known as: TOPROL-XL TAKE 1 TABLET BY MOUTH DAILY   Myrbetriq 50 MG Tb24 tablet Generic drug: mirabegron ER TAKE 1 TABLET BY MOUTH DAILY   naloxone 4 MG/0.1ML Liqd nasal spray  kit Commonly known as: NARCAN Place 1 spray into the nose once as needed (for overdose suspected). for opioid overdose   oxyCODONE 15 MG immediate release tablet Commonly known as: ROXICODONE Take 15 mg by mouth every 6 (six) hours as needed for pain.   OXYGEN Inhale 2-3 L into the lungs daily.   potassium chloride SA 20 MEQ tablet Commonly known as: KLOR-CON M TAKE 1 TABLET BY MOUTH DAILY   PRESCRIPTION MEDICATION Inhale into the lungs at bedtime. cpap   rivaroxaban 20 MG Tabs tablet Commonly known as: Xarelto Take 1 tablet (20 mg total) by mouth every morning.   Semaglutide(0.25 or 0.'5MG'$ /DOS) 2 MG/3ML Sopn Inject 0.5 Units into the skin once a week.   torsemide 20 MG tablet Commonly known as: DEMADEX Take 2 tablets by mouth daily, you may take an extra 1/2 tablet only as needed for weight gain of 2 lbs overnight or 5 lbs in a week What changed:  how much to take how to take this when to take this reasons to take this additional instructions   Trelegy Ellipta 100-62.5-25 MCG/ACT Aepb Generic drug: Fluticasone-Umeclidin-Vilant INHALE 1 PUFF INTO THE LUNGS DAILY         Past Medical History:  Diagnosis Date   A-fib ([place])    Acute hypoxemic respiratory failure ([place]) 02/20/2022   Acute on chronic hypoxic respiratory failure ([place]) 01/04/2022   Acute upper respiratory infection 01/08/2022   Anxiety    Arthritis    "qwhre; joints, back" (04/17/2017)   Atrial fibrillation ([place])    Benign breast cyst in female, left 01/08/2017   Found by Screening mammogram, evaluated by U/S on 01/08/17 and determined to be a benign simple breast cyst.   Breast cancer ([place])    Cellulitis of left lower leg 05/30/2017   CHF (congestive heart failure) (HCC)    Chronic low back pain 08/21/2016   Chronic lower back pain    Chronic venous insufficiency    /notes 05/30/2017   COPD (chronic obstructive pulmonary disease) ([place])    Depression    Diabetes ([place]) 01/08/2022   Diabetes  mellitus without complication ([place])    DVT (deep venous thrombosis) ([place]) 11/16/2016   Dysrhythmia    Esophageal dysmotility 11/10/2019   Previous workup by ENT with fiberoptic leryngoscopy which was normal. Dx with laryngeal pharyngeal reflux.   GERD (gastroesophageal reflux disease)    Headache    "weekly for the last 3 months" (04/17/2017)   History of pulmonary embolism    Pulmonary embolism   Hyperlipidemia    Hypertension    Laryngopharyngeal reflux 12/18/2015   Morbid obesity ([place])    PE (pulmonary embolism)    Pulmonary embolism ([place]) 09/21/2014   Sleep apnea    Stroke ([place]) 12/02/2021   Review of Systems:  per HPI.   Physical Exam: Vitals:   05/04/22 0956 05/04/22 1029  BP: (!) 146/101 (!) 135/91  Pulse: 94 61  Temp: 97.9 F (36.6 C)   TempSrc: Oral   SpO2: 94%   Height: '6\' 2"'$  (1.88 m)    Constitutional: Well-developed, well-nourished, appears  comfortable  HENT: Normocephalic and atraumatic.  Eyes: EOM are normal. PERRL.  Neck: Normal range of motion.  Cardiovascular: Regular rate, regular rhythm. No murmurs, rubs, or gallops. Normal radial and PT pulses bilaterally. Chronic non-pitting lower extremity swelling bilaterally. Pulmonary: Normal respiratory effort. No wheezes, rales, or rhonchi. No crackles.  Abdominal: Soft. Non-distended. No tenderness. Normal bowel sounds.  Musculoskeletal: Normal range of motion.     Neurological: Alert and oriented to person, place, and time. Non-focal. Skin: warm and dry. Chronic dry skin changes of bilateral shins. No active ulcers, bleeding, or drainage.   Assessment & Plan:   See Encounters Tab for problem based charting.  Patient seen with Dr. Saverio Danker

## 2022-05-04 NOTE — Assessment & Plan Note (Signed)
Patient was discharged on 1/25 after being hospitalized for acute severe symptomatic HTN and acute on chronic hypoxic respiratory failure. She was sent home on 2-3 L [place] and home albuterol, duonebs, and Trelegy Ellipta. She was on these medications and oxygen prior to hospitalization. Patient reports that they are compliant with these medications. Patient denies recent increase in use of these medications. Patient denies chest pain, SOB, difficulty breathing, lightheadedness, or dizziness.    Plan: - Continue home O2, albuterol, duonebs, and Trelegy Ellipta

## 2022-05-05 NOTE — Progress Notes (Signed)
Internal Medicine Clinic Attending  Case discussed with Dr. Alton Revere  At the time of the visit.  We reviewed the resident's history and exam and pertinent patient test results.  I agree with the assessment, diagnosis, and plan of care documented in the resident's note. Plan was to place Unna boots this clinic visit, however patient requested to leave prior to placement and wait until next week.

## 2022-05-09 ENCOUNTER — Ambulatory Visit (INDEPENDENT_AMBULATORY_CARE_PROVIDER_SITE_OTHER): Payer: 59

## 2022-05-09 VITALS — BP 114/64 | HR 95 | Temp 98.4°F | Wt 365.5 lb

## 2022-05-09 DIAGNOSIS — Z0000 Encounter for general adult medical examination without abnormal findings: Secondary | ICD-10-CM | POA: Insufficient documentation

## 2022-05-09 DIAGNOSIS — R8290 Unspecified abnormal findings in urine: Secondary | ICD-10-CM | POA: Diagnosis not present

## 2022-05-09 DIAGNOSIS — G4733 Obstructive sleep apnea (adult) (pediatric): Secondary | ICD-10-CM | POA: Diagnosis not present

## 2022-05-09 DIAGNOSIS — I7789 Other specified disorders of arteries and arterioles: Secondary | ICD-10-CM | POA: Insufficient documentation

## 2022-05-09 DIAGNOSIS — Z9981 Dependence on supplemental oxygen: Secondary | ICD-10-CM | POA: Diagnosis not present

## 2022-05-09 DIAGNOSIS — Z87891 Personal history of nicotine dependence: Secondary | ICD-10-CM | POA: Diagnosis not present

## 2022-05-09 DIAGNOSIS — I5032 Chronic diastolic (congestive) heart failure: Secondary | ICD-10-CM | POA: Diagnosis not present

## 2022-05-09 DIAGNOSIS — I872 Venous insufficiency (chronic) (peripheral): Secondary | ICD-10-CM | POA: Diagnosis not present

## 2022-05-09 NOTE — Assessment & Plan Note (Signed)
Referred for yearly lung cancer screening CT and colonoscopy today.

## 2022-05-09 NOTE — Assessment & Plan Note (Signed)
Patient was last seen in clinic for foul smelling urine on 2/1. Patient reported foul smelling urine and increased urinary frequency during her recent admission although UA was negative. Patient is currently taking Myrbetriq 50 mg daily for overactive bladder. Patient continues to have foul smelling and dark urine. Patient denies dysuria. Urinary frequency difficult to assess given her torsemide use. Low suspicion for UTI. Plan was to perform pelvic exam at this visit to r/o yeast infection. Patient reports improvement in the smell of her urine. Patient denies vaginal discharge. Patient would prefer to wait on pelvic exam.   Plan: - Continue to monitor clinically

## 2022-05-09 NOTE — Progress Notes (Signed)
CC: chronic lower extremity swelling  HPI:  Sharon Vasquez is a 72 y.o. female with past medical history of HTN, HLD, pre-DM, PVD, HFpEF, a-fib, COPD, OSA, GERD, OA, osteoporosis, chronic venous insufficiency, and chronic pain that presents for chronic lower extremity swelling.   Patient has a history of chronic venous insufficiency. She reports chronic LE swelling that is unchanged over the last few months despite diuretic use. She was seen by vascular surgery in 03/2017 for this condition. No evidence of DVT was found. It was recommended that she manage this with leg elevation, compression stalkings, and water aerobics if possible. Patient has had difficulty tolerating compression stalkings previously. She would like to try unna boots again.   Patient also has a history of HFpEF. She was discharged on 1/25 after being hospitalized for acute severe symptomatic HTN and acute on chronic hypoxic respiratory failure. Patient was prescribed torsemide 20 mg BID prior to her hospitalization. She reported intermittent compliance with this medication. She was discharged on home losartan 25 mg daily and metoprolol succinate 25 mg daily. Patient reports that she is compliant with these medications since her hospitalization. Patient states that they do not check their BP regularly at home. Patient has chronic LE swelling that she reports is unchanged over the last few months. Patient denies HA, lightheadedness, dizziness, CP, palpitations, or SOB.   Patient was last seen in clinic for foul smelling urine on 2/1. Patient reported foul smelling urine and increased urinary frequency during her recent admission although UA was negative. Patient is currently taking Myrbetriq 50 mg daily for overactive bladder. Patient continues to have foul smelling and dark urine. Patient denies dysuria. Urinary frequency difficult to assess given her torsemide use. Low suspicion for UTI. Plan was to perform pelvic exam at this  visit to r/o yeast infection. Patient reports improvement in the smell of her urine. Patient denies vaginal discharge. Patient would prefer to wait on pelvic exam.   Patient is comfortable being referred for yearly lung cancer screening CT and colonoscopy.     Allergies as of 05/09/2022   No Known Allergies      Medication List        Accurate as of May 09, 2022 10:40 AM. If you have any questions, ask your nurse or doctor.          albuterol 108 (90 Base) MCG/ACT inhaler Commonly known as: VENTOLIN HFA Inhale 2 puffs into the lungs every 4 (four) hours as needed for shortness of breath.   aspirin EC 81 MG tablet Take 81 mg by mouth daily.   atorvastatin 80 MG tablet Commonly known as: LIPITOR TAKE 1 TABLET BY MOUTH EVERY DAY What changed:  how much to take how to take this when to take this additional instructions   esomeprazole 40 MG capsule Commonly known as: NEXIUM Take 1 capsule (40 mg total) by mouth daily as needed (for heartburn or indigestion). What changed: when to take this   gabapentin 400 MG capsule Commonly known as: NEURONTIN TAKE 1 CAPSULE BY MOUTH  TWICE DAILY What changed: when to take this   ibuprofen 200 MG tablet Commonly known as: ADVIL Take 800 mg by mouth daily as needed for moderate pain.   ipratropium-albuterol 0.5-2.5 (3) MG/3ML Soln Commonly known as: DUONEB Take 3 mLs by nebulization every 6 (six) hours as needed. What changed: reasons to take this   Jardiance 10 MG Tabs tablet Generic drug: empagliflozin Take 1 tablet (10 mg total) by mouth daily  before breakfast.   letrozole 2.5 MG tablet Commonly known as: [place] TAKE 1 TABLET(2.5 MG) BY MOUTH DAILY What changed:  how much to take how to take this when to take this additional instructions   losartan 25 MG tablet Commonly known as: Cozaar Take 1 tablet (25 mg total) by mouth daily.   metoprolol succinate 25 MG 24 hr tablet Commonly known as: TOPROL-XL TAKE 1  TABLET BY MOUTH DAILY   Myrbetriq 50 MG Tb24 tablet Generic drug: mirabegron ER TAKE 1 TABLET BY MOUTH DAILY   naloxone 4 MG/0.1ML Liqd nasal spray kit Commonly known as: NARCAN Place 1 spray into the nose once as needed (for overdose suspected). for opioid overdose   oxyCODONE 15 MG immediate release tablet Commonly known as: ROXICODONE Take 15 mg by mouth every 6 (six) hours as needed for pain.   OXYGEN Inhale 2-3 L into the lungs daily.   potassium chloride SA 20 MEQ tablet Commonly known as: KLOR-CON M TAKE 1 TABLET BY MOUTH DAILY   PRESCRIPTION MEDICATION Inhale into the lungs at bedtime. cpap   rivaroxaban 20 MG Tabs tablet Commonly known as: Xarelto Take 1 tablet (20 mg total) by mouth every morning.   Semaglutide(0.25 or 0.'5MG'$ /DOS) 2 MG/3ML Sopn Inject 0.5 Units into the skin once a week.   torsemide 20 MG tablet Commonly known as: DEMADEX Take 2 tablets by mouth daily, you may take an extra 1/2 tablet only as needed for weight gain of 2 lbs overnight or 5 lbs in a week What changed:  how much to take how to take this when to take this reasons to take this additional instructions   Trelegy Ellipta 100-62.5-25 MCG/ACT Aepb Generic drug: Fluticasone-Umeclidin-Vilant INHALE 1 PUFF INTO THE LUNGS DAILY         Past Medical History:  Diagnosis Date   A-fib ([place])    Acute hypoxemic respiratory failure ([place]) 02/20/2022   Acute on chronic hypoxic respiratory failure ([place]) 01/04/2022   Acute upper respiratory infection 01/08/2022   Anxiety    Arthritis    "qwhre; joints, back" (04/17/2017)   Atrial fibrillation ([place])    Benign breast cyst in female, left 01/08/2017   Found by Screening mammogram, evaluated by U/S on 01/08/17 and determined to be a benign simple breast cyst.   Breast cancer ([place])    Cellulitis of left lower leg 05/30/2017   CHF (congestive heart failure) ([facility])    Chronic low back pain 08/21/2016   Chronic lower back pain    Chronic venous  insufficiency    /notes 05/30/2017   COPD (chronic obstructive pulmonary disease) ([place])    Depression    Diabetes ([place]) 01/08/2022   Diabetes mellitus without complication ([place])    DVT (deep venous thrombosis) ([place]) 11/16/2016   Dysrhythmia    Esophageal dysmotility 11/10/2019   Previous workup by ENT with fiberoptic leryngoscopy which was normal. Dx with laryngeal pharyngeal reflux.   GERD (gastroesophageal reflux disease)    Headache    "weekly for the last 3 months" (04/17/2017)   History of pulmonary embolism    Pulmonary embolism   Hyperlipidemia    Hypertension    Laryngopharyngeal reflux 12/18/2015   Morbid obesity ([place])    PE (pulmonary embolism)    Pulmonary embolism ([place]) 09/21/2014   Sleep apnea    Stroke ([place]) 12/02/2021   Review of Systems:  per HPI.   Physical Exam: Vitals:   05/09/22 0948  BP: 114/64  Pulse: 95  Temp: 98.4 F (36.9  C)  TempSrc: Oral  SpO2: 97%  Weight: (!) 365 lb 8 oz (165.8 kg)   Constitutional: Well-developed, well-nourished, appears comfortable  HENT: Normocephalic and atraumatic.  Eyes: EOM are normal. PERRL.  Neck: Normal range of motion.  Cardiovascular: Regular rate, regular rhythm. No murmurs, rubs, or gallops. Normal radial and PT pulses bilaterally. Chronic non-pitting lower extremity swelling bilaterally. Pulmonary: Normal respiratory effort. No wheezes, rales, or rhonchi. No crackles.  Abdominal: Soft. Non-distended. No tenderness. Normal bowel sounds.  Musculoskeletal: Normal range of motion.     Neurological: Alert and oriented to person, place, and time. Non-focal. Skin: warm and dry. Chronic dry skin changes of bilateral shins. No active ulcers, bleeding, or drainage.   Assessment & Plan:   See Encounters Tab for problem based charting.  Patient seen with Dr. Heber Siren

## 2022-05-09 NOTE — Assessment & Plan Note (Signed)
Patient has a history of chronic venous insufficiency. She reports chronic LE swelling that is unchanged over the last few months despite diuretic use. She was seen by vascular surgery in 03/2017 for this condition. No evidence of DVT was found. It was recommended that she manage this with leg elevation, compression stalkings, and water aerobics if possible. Patient has had difficulty tolerating compression stalkings previously. She would like to try unna boots again. Discussed that this is only a short term solution and that long term management of her LE swelling will need to involve the recommendations of the vascular surgeon.   Plan: - Unna boots placed today for LE edema w/ weekly f/u for replacement - Will need to continue to discuss alternative therapy options at her next appointment

## 2022-05-09 NOTE — Patient Instructions (Addendum)
Thank you for coming to see [place] in clinic Sharon Vasquez.   Plan: - We placed unna boots today to help with your lower extremity swelling (we will need to see you back in clinic in 1 week to replace them)  - If you develop worsening urine smell, pain with urination, increased urine production, or vaginal discharge, please call our clinic as these may be signs of infection.   - We referred you for a yearly lung cancer screening CT (you will be called to schedule an appointment)   - We referred you to a stomach doctor (gastroenterology) for a colonoscopy (you will be called to schedule an appointment)     It was very nice to see you, thank you for allowing [place] to be involved in your care.   Please arrive to your next appointment 5-10 minutes before your scheduled appointment time, thank you!

## 2022-05-09 NOTE — Assessment & Plan Note (Signed)
Noted on CT chest from 05/2020 (Enlargement of the tubular ascending thoracic aorta measuring approximately 4.4 x 4.3 cm w/ recommend annual imaging followup by CTA or MRA). Dilatation of the pulmonic trunk (4.1 cm in diameter), concerning for associated pulmonary arterial hypertension noted on CT chest from 05/2021.  Plan: - Repeat CT chest this year

## 2022-05-09 NOTE — Assessment & Plan Note (Signed)
Patient was discharged on 1/25 after being hospitalized for acute severe symptomatic HTN and acute on chronic hypoxic respiratory failure. Patient was prescribed torsemide 20 mg BID prior to her hospitalization. She reported intermittent compliance with this medication. She was discharged on home losartan 25 mg daily and metoprolol succinate 25 mg daily. Patient reports that she is compliant with these medications since her hospitalization. Echo from 04/2022 demonstrates EF 60-65%. Patient states that they do not check their BP regularly at home. Patient has chronic LE swelling that she reports is unchanged over the last few months. Does have a history of chronic venous insufficiency which is likely the cause of the majority of her swelling. Patient denies HA, lightheadedness, dizziness, CP, palpitations, or SOB. Initial BP today is 114/64.   Plan: - Continue losartan 25 mg daily, metoprolol succinate 25 mg daily, torsemide 20 mg twice daily - Unna boots placed today for LE edema w/ weekly f/u for replacement

## 2022-05-10 ENCOUNTER — Other Ambulatory Visit: Payer: Self-pay

## 2022-05-10 ENCOUNTER — Inpatient Hospital Stay: Payer: 59 | Attending: Hematology and Oncology | Admitting: Hematology and Oncology

## 2022-05-10 ENCOUNTER — Other Ambulatory Visit: Payer: Self-pay | Admitting: Internal Medicine

## 2022-05-10 ENCOUNTER — Telehealth: Payer: Self-pay | Admitting: Hematology and Oncology

## 2022-05-10 VITALS — BP 125/88 | HR 73 | Temp 97.5°F | Resp 18 | Ht 74.0 in | Wt 363.0 lb

## 2022-05-10 DIAGNOSIS — Z7982 Long term (current) use of aspirin: Secondary | ICD-10-CM | POA: Insufficient documentation

## 2022-05-10 DIAGNOSIS — I5032 Chronic diastolic (congestive) heart failure: Secondary | ICD-10-CM

## 2022-05-10 DIAGNOSIS — Z79811 Long term (current) use of aromatase inhibitors: Secondary | ICD-10-CM | POA: Insufficient documentation

## 2022-05-10 DIAGNOSIS — Z170 Estrogen receptor positive status [ER+]: Secondary | ICD-10-CM | POA: Insufficient documentation

## 2022-05-10 DIAGNOSIS — C50411 Malignant neoplasm of upper-outer quadrant of right female breast: Secondary | ICD-10-CM | POA: Diagnosis not present

## 2022-05-10 DIAGNOSIS — Z79899 Other long term (current) drug therapy: Secondary | ICD-10-CM | POA: Diagnosis not present

## 2022-05-10 DIAGNOSIS — E669 Obesity, unspecified: Secondary | ICD-10-CM | POA: Diagnosis not present

## 2022-05-10 NOTE — Assessment & Plan Note (Signed)
03/11/2021:Bilateral breast pain for 3-4 months. Diagnostic mammogram: irregular mass in the anterior aspect of the right breast laterally at 10:00 1.7 cm.  Ultrasound: 2 masses 1.7 cm and 0.9 cm biopsy: Grade 2 invasive ductal carcinoma and DCIS, ER/PR+(100%)/Her2-.  Ki-67 5%   05/02/2020:Right lumpectomy: Grade 2 IDC 1.4 cm with low-grade to intermediate grade DCIS, margins negative, ER 100%, PR 100%, HER2 negative, Ki-67 5%   Pathology counseling: I discussed the final pathology report of the patient provided  a copy of this report. I discussed the margins as well as lymph node surgeries. We also discussed the final staging along with previously performed ER/PR and HER-2/neu testing.   Treatment plan: 1. patient is not a candidate for chemotherapy given her multiple health issues and performance status. 2. not a candidate for adjuvant radiation therapy: Due to her body stature and lung issues  3. Adjuvant antiestrogen therapy with letrozole started prior to surgery and she appears to be tolerating it well.   Obesity: She will discuss with her primary care clinic about using weight loss medications.  I strongly suggest that she try 1 of these medications to help lose weight.  Breast cancer surveillance: Mammogram 03/03/2022: Benign breast density category B Return to clinic in 1 year for follow-up

## 2022-05-10 NOTE — Telephone Encounter (Signed)
Scheduled appointment per 2/7 los. Patient is aware.

## 2022-05-10 NOTE — Progress Notes (Signed)
Patient Care Team: [name], DO as PCP - General Gwenlyn Found Pearletha Forge, MD as PCP - Cardiology (Cardiology) Jovita Kussmaul, MD as Consulting Physician (General Surgery) Nicholas Lose, MD as Consulting Physician (Hematology and Oncology) Kyung Rudd, MD as Consulting Physician (Radiation Oncology) Rockwell Germany, RN as Oncology Nurse Navigator Mauro Kaufmann, RN as Oncology Nurse Navigator  DIAGNOSIS:  Encounter Diagnosis  Name Primary?   Malignant neoplasm of upper-outer quadrant of right breast in female, estrogen receptor positive ([place]) Yes    SUMMARY OF ONCOLOGIC HISTORY: Oncology History  Malignant neoplasm of upper-outer quadrant of right breast in female, estrogen receptor positive ([place])  03/11/2021 Initial Diagnosis   Bilateral breast pain for 3-4 months. Diagnostic mammogram: irregular mass in the anterior aspect of the right breast laterally at 10:00 1.7 cm.  Ultrasound: 2 masses 1.7 cm and 0.9 cm biopsy: Grade 2 invasive ductal carcinoma and DCIS, ER/PR+(100%)/Her2-.  Ki-67 5%   03/23/2021 Cancer Staging   Staging form: Breast, AJCC 8th Edition - Clinical stage from 03/23/2021: Stage IA (cT1c, cN0, cM0, G2, ER+, PR+, HER2-) - Signed by Nicholas Lose, MD on 03/23/2021 Stage prefix: Initial diagnosis Histologic grading system: 3 grade system   04/2021 -  Anti-estrogen oral therapy   Letrozole daily   05/02/2021 Surgery   Right lumpectomy: Grade 2 IDC 1.4 cm with low-grade to intermediate grade DCIS, margins negative, ER 100%, PR 100%, HER2 negative, Ki-67 5%   05/02/2021 Cancer Staging   Staging form: Breast, AJCC 8th Edition - Pathologic stage from 05/02/2021: Stage IA (pT1c, pN0, cM0, G2, ER+, PR+, HER2-) - Signed by Gardenia Phlegm, NP on 11/07/2021 Stage prefix: Initial diagnosis Histologic grading system: 3 grade system     CHIEF COMPLIANT: Follow-up of right breast cancer   INTERVAL HISTORY: Sharon Vasquez is a 72 y.o. with above-mentioned history  of right breast cancer.  She presents to the clinic for a follow-up. She reports that she had fluid in her lungs, and she had to go to the hospital. She believe she had ane UTI, because she had an odor and she said the odor was really strong. She has to wrap her legs because she gets water blisters. She says she tolerating the letrozole extremely well.  ALLERGIES:  has No Known Allergies.  MEDICATIONS:  Current Outpatient Medications  Medication Sig Dispense Refill   albuterol (VENTOLIN HFA) 108 (90 Base) MCG/ACT inhaler Inhale 2 puffs into the lungs every 4 (four) hours as needed for shortness of breath. 18 g 0   aspirin EC 81 MG tablet Take 81 mg by mouth daily.     atorvastatin (LIPITOR) 80 MG tablet TAKE 1 TABLET BY MOUTH EVERY DAY (Patient taking differently: Take 80 mg by mouth daily.) 90 tablet 3   empagliflozin (JARDIANCE) 10 MG TABS tablet Take 1 tablet (10 mg total) by mouth daily before breakfast. 90 tablet 0   esomeprazole (NEXIUM) 40 MG capsule Take 1 capsule (40 mg total) by mouth daily as needed (for heartburn or indigestion). (Patient taking differently: Take 40 mg by mouth daily.) 90 capsule 1   gabapentin (NEURONTIN) 400 MG capsule TAKE 1 CAPSULE BY MOUTH  TWICE DAILY (Patient taking differently: Take 400 mg by mouth at bedtime.) 200 capsule 2   ibuprofen (ADVIL) 200 MG tablet Take 800 mg by mouth daily as needed for moderate pain.     ipratropium-albuterol (DUONEB) 0.5-2.5 (3) MG/3ML SOLN Take 3 mLs by nebulization every 6 (six) hours as needed. (Patient  taking differently: Take 3 mLs by nebulization every 6 (six) hours as needed (shortness of breath).) 360 mL 3   letrozole (FEMARA) 2.5 MG tablet TAKE 1 TABLET(2.5 MG) BY MOUTH DAILY (Patient taking differently: Take 2.5 mg by mouth daily.) 90 tablet 3   losartan (COZAAR) 25 MG tablet Take 1 tablet (25 mg total) by mouth daily. 30 tablet 11   metoprolol succinate (TOPROL-XL) 25 MG 24 hr tablet TAKE 1 TABLET BY MOUTH DAILY 100  tablet 2   MYRBETRIQ 50 MG TB24 tablet TAKE 1 TABLET BY MOUTH DAILY 100 tablet 2   naloxone (NARCAN) nasal spray 4 mg/0.1 mL Place 1 spray into the nose once as needed (for overdose suspected). for opioid overdose     oxyCODONE (ROXICODONE) 15 MG immediate release tablet Take 15 mg by mouth every 6 (six) hours as needed for pain.     OXYGEN Inhale 2-3 L into the lungs daily.     potassium chloride SA (KLOR-CON M) 20 MEQ tablet TAKE 1 TABLET BY MOUTH DAILY 90 tablet 0   PRESCRIPTION MEDICATION Inhale into the lungs at bedtime. cpap     rivaroxaban (XARELTO) 20 MG TABS tablet Take 1 tablet (20 mg total) by mouth every morning. 90 tablet 3   Semaglutide,0.25 or 0.'5MG'$ /DOS, 2 MG/3ML SOPN Inject 0.5 Units into the skin once a week. 3 mL 2   torsemide (DEMADEX) 20 MG tablet Take 2 tablets by mouth daily, you may take an extra 1/2 tablet only as needed for weight gain of 2 lbs overnight or 5 lbs in a week (Patient taking differently: Take 20 mg by mouth daily as needed (for legs fluid).) 225 tablet 1   TRELEGY ELLIPTA 100-62.5-25 MCG/ACT AEPB INHALE 1 PUFF INTO THE LUNGS DAILY 60 each 5   No current facility-administered medications for this visit.    PHYSICAL EXAMINATION: ECOG PERFORMANCE STATUS: 2 - Symptomatic, <50% confined to bed  Vitals:   05/10/22 0813  BP: 125/88  Pulse: 73  Resp: 18  Temp: (!) 97.5 F (36.4 C)  SpO2: 95%   Filed Weights   05/10/22 0813  Weight: (!) 363 lb (164.7 kg)      LABORATORY DATA:  I have reviewed the data as listed    Latest Ref Rng & Units 04/27/2022    4:07 AM 04/26/2022    6:22 AM 03/28/2022    6:31 AM  CMP  Glucose 70 - 99 mg/dL 135  113  112   BUN 8 - 23 mg/dL '14  10  10   '$ Creatinine 0.44 - 1.00 mg/dL 0.87  0.91  0.94   Sodium 135 - 145 mmol/L 140  141  143   Potassium 3.5 - 5.1 mmol/L 3.7  3.3  3.6   Chloride 98 - 111 mmol/L 106  104  111   CO2 22 - 32 mmol/L '28  26  25   '$ Calcium 8.9 - 10.3 mg/dL 8.6  9.0  8.9   Total Protein 6.5 - 8.1  g/dL   6.9   Total Bilirubin 0.3 - 1.2 mg/dL   1.0   Alkaline Phos 38 - 126 U/L   78   AST 15 - 41 U/L   13   ALT 0 - 44 U/L   19     Lab Results  Component Value Date   WBC 6.7 04/26/2022   HGB 12.1 04/26/2022   HCT 36.8 04/26/2022   MCV 86.6 04/26/2022   PLT 451 (H) 04/26/2022   NEUTROABS  3.7 03/28/2022    ASSESSMENT & PLAN:  Malignant neoplasm of upper-outer quadrant of right breast in female, estrogen receptor positive ([place]) 03/11/2021:Bilateral breast pain for 3-4 months. Diagnostic mammogram: irregular mass in the anterior aspect of the right breast laterally at 10:00 1.7 cm.  Ultrasound: 2 masses 1.7 cm and 0.9 cm biopsy: Grade 2 invasive ductal carcinoma and DCIS, ER/PR+(100%)/Her2-.  Ki-67 5%   05/02/2020:Right lumpectomy: Grade 2 IDC 1.4 cm with low-grade to intermediate grade DCIS, margins negative, ER 100%, PR 100%, HER2 negative, Ki-67 5%   Pathology counseling: I discussed the final pathology report of the patient provided  a copy of this report. I discussed the margins as well as lymph node surgeries. We also discussed the final staging along with previously performed ER/PR and HER-2/neu testing.   Treatment plan: 1. patient is not a candidate for chemotherapy given her multiple health issues and performance status. 2. not a candidate for adjuvant radiation therapy: Due to her body stature and lung issues  3. Adjuvant antiestrogen therapy with letrozole started prior to surgery and she appears to be tolerating it well.   Obesity: She will discuss with her primary care clinic about using weight loss medications.  I strongly suggest that she try 1 of these medications to help lose weight.  Breast cancer surveillance: Mammogram 03/03/2022: Benign breast density category B Return to clinic in 1 year for follow-up   No orders of the defined types were placed in this encounter.  The patient has a good understanding of the overall plan. she agrees with it. she will call  with any problems that may develop before the next visit here. Total time spent: 30 mins including face to face time and time spent for planning, charting and co-ordination of care   Harriette Ohara, MD 05/10/22    I Gardiner Coins am acting as a Education administrator for [employer]  I have reviewed the above documentation for accuracy and completeness, and I agree with the above.

## 2022-05-13 DIAGNOSIS — M170 Bilateral primary osteoarthritis of knee: Secondary | ICD-10-CM | POA: Diagnosis not present

## 2022-05-13 DIAGNOSIS — G4733 Obstructive sleep apnea (adult) (pediatric): Secondary | ICD-10-CM | POA: Diagnosis not present

## 2022-05-15 NOTE — Progress Notes (Signed)
Internal Medicine Clinic Attending  Case discussed with the resident at the time of the visit.  We reviewed the resident's history and exam and pertinent patient test results.  I agree with the assessment, diagnosis, and plan of care documented in the resident's note.  

## 2022-05-17 ENCOUNTER — Encounter: Payer: Self-pay | Admitting: Student

## 2022-05-17 ENCOUNTER — Other Ambulatory Visit: Payer: Self-pay

## 2022-05-17 ENCOUNTER — Telehealth: Payer: Self-pay

## 2022-05-17 ENCOUNTER — Ambulatory Visit (INDEPENDENT_AMBULATORY_CARE_PROVIDER_SITE_OTHER): Payer: 59 | Admitting: Student

## 2022-05-17 VITALS — BP 136/82 | HR 82 | Temp 98.2°F | Resp 32 | Ht 74.0 in | Wt 363.0 lb

## 2022-05-17 DIAGNOSIS — Z7984 Long term (current) use of oral hypoglycemic drugs: Secondary | ICD-10-CM | POA: Diagnosis not present

## 2022-05-17 DIAGNOSIS — E119 Type 2 diabetes mellitus without complications: Secondary | ICD-10-CM

## 2022-05-17 DIAGNOSIS — E1151 Type 2 diabetes mellitus with diabetic peripheral angiopathy without gangrene: Secondary | ICD-10-CM | POA: Diagnosis not present

## 2022-05-17 DIAGNOSIS — I872 Venous insufficiency (chronic) (peripheral): Secondary | ICD-10-CM

## 2022-05-17 MED ORDER — SEMAGLUTIDE(0.25 OR 0.5MG/DOS) 2 MG/3ML ~~LOC~~ SOPN: 0.5000 [IU] | PEN_INJECTOR | SUBCUTANEOUS | 2 refills | Status: DC

## 2022-05-17 MED ORDER — TRELEGY ELLIPTA 100-62.5-25 MCG/ACT IN AEPB: 1.0000 | INHALATION_SPRAY | Freq: Every day | RESPIRATORY_TRACT | 5 refills | Status: DC

## 2022-05-17 NOTE — Patient Instructions (Addendum)
Thank you so much for coming to the clinic today!   Glad to hear that you are doing well! I will re-order the trelegy and ozempic as they seem to be working for you. Keep wearing the UNNA boots, and try to keep your legs elevated! We will follow up with you when they need to be replaced again.     If you have any questions please feel free to the call the clinic at anytime at [phone]. It was a pleasure seeing you!  Best, Dr. Sanjuana Mae

## 2022-05-17 NOTE — Patient Outreach (Signed)
  Care Coordination   In Person Provider Office Visit Note   05/17/2022 Name: Sharon Vasquez [name]: [id number] DOB: 01/27/1951  Sharon Vasquez is a 72 y.o. year old female who sees Atway, Rayann N, DO for primary care. I engaged with Sharon Vasquez in the providers office today.  What matters to the patients health and wellness today?  Palliative Care appointment and patient need of assistance at home.   Goals Addressed               This Visit's Progress     "Palliative care was suppose to come to my home and they missed appointment" (pt-stated)        Care Coordination Interventions: Provided education to patient re: Palliative care appointment and encouraged patient to allow appointment to be rescheduled.  Explained the role of palliative care. Discussed plans with patient for ongoing care management follow up and provided patient with direct contact information for care management team Assessed social determinant of health barriers          SDOH assessments and interventions completed:  Yes  SDOH Interventions Today    Flowsheet Row Most Recent Value  SDOH Interventions   Food Insecurity Interventions Intervention Not Indicated  Housing Interventions Intervention Not Indicated        Care Coordination Interventions:  Yes, provided   Follow up plan: No further intervention required.   Encounter Outcome:  Pt. Visit Completed

## 2022-05-19 NOTE — Assessment & Plan Note (Signed)
Patient has history of chronic venous insufficiency, and on her visit on February 6, Unna boots were placed as compression stockings were very uncomfortable for her and she had much difficulty using them.  She is doing well with her Unna boots, and states that her leg swelling has been improving.  She denies any shortness of breath, worsening leg pain.  Have recommended follow-up with vascular surgery as well for further evaluation.  Plan: - Unna boots replaced today

## 2022-05-19 NOTE — Progress Notes (Signed)
CC: Unna boots replacement  HPI:  [name] is a 72 y.o. female living with a history stated below and presents today for [employer] boot replacement. Please see problem based assessment and plan for additional details.  Past Medical History:  Diagnosis Date   A-fib ([place])    Acute hypoxemic respiratory failure ([place]) 02/20/2022   Acute on chronic hypoxic respiratory failure ([place]) 01/04/2022   Acute upper respiratory infection 01/08/2022   Anxiety    Arthritis    "qwhre; joints, back" (04/17/2017)   Atrial fibrillation ([facility])    Benign breast cyst in female, left 01/08/2017   Found by Screening mammogram, evaluated by [facility] on 01/08/17 and determined to be a benign simple breast cyst.   Breast cancer ([place])    Cellulitis of left lower leg 05/30/2017   CHF (congestive heart failure) (H[facility])    Chronic low back pain 08/21/2016   Chronic lower back pain    Chronic venous insufficiency    /notes 05/30/2017   COPD (chronic obstructive pulmonary disease) ([place])    Depression    Diabetes ([place]) 01/08/2022   Diabetes mellitus without complication ([place])    DVT (deep venous thrombosis) ([place]) 11/16/2016   Dysrhythmia    Esophageal dysmotility 11/10/2019   Previous workup by ENT with fiberoptic leryngoscopy which was normal. Dx with laryngeal pharyngeal reflux.   GERD (gastroesophageal reflux disease)    Headache    "weekly for the last 3 months" (04/17/2017)   History of pulmonary embolism    Pulmonary embolism   Hyperlipidemia    Hypertension    Laryngopharyngeal reflux 12/18/2015   Morbid obesity ([place])    PE (pulmonary embolism)    Pulmonary embolism ([place]) 09/21/2014   Sleep apnea    Stroke ([place]) 12/02/2021    Current Outpatient Medications on File Prior to Visit  Medication Sig Dispense Refill   albuterol (VENTOLIN HFA) 108 (90 Base) MCG/ACT inhaler Inhale 2 puffs into the lungs every 4 (four) hours as needed for shortness of breath. 18 g 0   aspirin EC 81 MG tablet Take 81 mg by  mouth daily.     atorvastatin (LIPITOR) 80 MG tablet TAKE 1 TABLET BY MOUTH EVERY DAY (Patient taking differently: Take 80 mg by mouth daily.) 90 tablet 3   empagliflozin (JARDIANCE) 10 MG TABS tablet Take 1 tablet (10 mg total) by mouth daily before breakfast. 90 tablet 0   esomeprazole (NEXIUM) 40 MG capsule Take 1 capsule (40 mg total) by mouth daily as needed (for heartburn or indigestion). (Patient taking differently: Take 40 mg by mouth daily.) 90 capsule 1   gabapentin (NEURONTIN) 400 MG capsule TAKE 1 CAPSULE BY MOUTH  TWICE DAILY (Patient taking differently: Take 400 mg by mouth at bedtime.) 200 capsule 2   ibuprofen (ADVIL) 200 MG tablet Take 800 mg by mouth daily as needed for moderate pain.     ipratropium-albuterol (DUONEB) 0.5-2.5 (3) MG/3ML SOLN Take 3 mLs by nebulization every 6 (six) hours as needed. (Patient taking differently: Take 3 mLs by nebulization every 6 (six) hours as needed (shortness of breath).) 360 mL 3   letrozole (FEMARA) 2.5 MG tablet TAKE 1 TABLET(2.5 MG) BY MOUTH DAILY (Patient taking differently: Take 2.5 mg by mouth daily.) 90 tablet 3   losartan (COZAAR) 25 MG tablet Take 1 tablet (25 mg total) by mouth daily. 30 tablet 11   metoprolol succinate (TOPROL-XL) 25 MG 24 hr tablet TAKE 1 TABLET BY MOUTH DAILY 100 tablet 2   MYRBETRIQ  50 MG TB24 tablet TAKE 1 TABLET BY MOUTH DAILY 100 tablet 2   naloxone (NARCAN) nasal spray 4 mg/0.1 mL Place 1 spray into the nose once as needed (for overdose suspected). for opioid overdose     oxyCODONE (ROXICODONE) 15 MG immediate release tablet Take 15 mg by mouth every 6 (six) hours as needed for pain.     OXYGEN Inhale 2-3 L into the lungs daily.     potassium chloride SA (KLOR-CON M) 20 MEQ tablet TAKE 1 TABLET BY MOUTH DAILY 90 tablet 0   PRESCRIPTION MEDICATION Inhale into the lungs at bedtime. cpap     rivaroxaban (XARELTO) 20 MG TABS tablet Take 1 tablet (20 mg total) by mouth every morning. 90 tablet 3   torsemide  (DEMADEX) 20 MG tablet Take 2 tablets by mouth daily, you may take an extra 1/2 tablet only as needed for weight gain of 2 lbs overnight or 5 lbs in a week (Patient taking differently: Take 20 mg by mouth daily as needed (for legs fluid).) 225 tablet 1   No current facility-administered medications on file prior to visit.    Family History  Problem Relation Age of Onset   Breast cancer Mother        before age 71   Hypertension Mother    Hyperlipidemia Mother    Hypertension Maternal Grandfather    Hyperlipidemia Maternal Grandfather     Social History   Socioeconomic History   Marital status: Divorced    Spouse name: Not on file   Number of children: Not on file   Years of education: Not on file   Highest education level: Not on file  Occupational History   Not on file  Tobacco Use   Smoking status: Former    Packs/day: 2.00    Years: 55.00    Total pack years: 110.00    Types: Cigarettes    Start date: 08/16/1961    Quit date: 01/02/2022    Years since quitting: 0.3   Smokeless tobacco: Never   Tobacco comments:    Pt smokes about 4 ciggs daily. Stopped 01/02/2022   Vaping Use   Vaping Use: Never used  Substance and Sexual Activity   Alcohol use: No   Drug use: No   Sexual activity: Not Currently    Partners: Male  Other Topics Concern   Not on file  Social History Narrative   Lives in [facility] in [place]. Lives alone, but is dependent in ADLs/IADLs. 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 Two daughters live nearby. Previously resided in [place].   Social Determinants of Health   Financial Resource Strain: Medium Risk (12/02/2021)   Overall Financial Resource Strain (CARDIA)    Difficulty of Paying Living Expenses: Somewhat hard  Food Insecurity: No Food Insecurity (05/17/2022)   Hunger Vital Sign    Worried About Running Out of Food in the Last Year: Never true    Ran Out of Food in the Last Year: Never true   Transportation Needs: No Transportation Needs (03/15/2022)   PRAPARE - Hydrologist (Medical): No    Lack of Transportation (Non-Medical): No  Physical Activity: Inactive (12/02/2021)   Exercise Vital Sign    Days of Exercise per Week: 0 days    Minutes of Exercise per Session: 0 min  Stress: No Stress Concern Present (12/02/2021)   [place]  Questionnaire    Feeling of Stress : Only a little  Social Connections: Moderately Integrated (03/15/2022)   Social Connection and Isolation Panel [NHANES]    Frequency of Communication with Friends and Family: More than three times a week    Frequency of Social Gatherings with Friends and Family: More than three times a week    Attends Religious Services: More than 4 times per year    Active Member of [employer] or Organizations: Yes    Attends Music therapist: More than 4 times per year    Marital Status: Divorced  Intimate Partner Violence: Not At Risk (03/15/2022)   Humiliation, Afraid, Rape, and Kick questionnaire    Fear of Current or Ex-Partner: No    Emotionally Abused: No    Physically Abused: No    Sexually Abused: No    Review of Systems: ROS negative except for what is noted on the assessment and plan.  Vitals:   05/17/22 0946  BP: 136/82  Pulse: 82  Resp: (!) 32  Temp: 98.2 F (36.8 C)  TempSrc: Oral  SpO2: 94%  Weight: (!) 363 lb (164.7 kg)  Height: 6' 2"$  (1.88 m)    Physical Exam: Constitutional: Obese woman in motorized chair, in no acute distress HENT: normocephalic atraumatic, mucous membranes moist Eyes: conjunctiva non-erythematous Neck: supple Cardiovascular: regular rate and rhythm, no m/r/g, +3 lower extremity edema bilaterally Pulmonary/Chest: normal work of breathing on room air, lungs clear to auscultation bilaterally Abdominal: soft, non-tender, non-distended Skin: Chronic dry skin of bilateral shins, no active  ulcers, bleeding, drainage.   Assessment & Plan:   Chronic venous insufficiency Patient has history of chronic venous insufficiency, and on her visit on February 6, Unna boots were placed as compression stockings were very uncomfortable for her and she had much difficulty using them.  She is doing well with her Unna boots, and states that her leg swelling has been improving.  She denies any shortness of breath, worsening leg pain.  Have recommended follow-up with vascular surgery as well for further evaluation.  Plan: - Unna boots replaced today  Patient discussed with Dr. Ronny Bacon Tylon Kemmerling, M.D. [facility], PGY-1 Phone: [phone] Date 05/19/2022 Time 8:02 AM

## 2022-05-20 DIAGNOSIS — J449 Chronic obstructive pulmonary disease, unspecified: Secondary | ICD-10-CM | POA: Diagnosis not present

## 2022-05-22 NOTE — Progress Notes (Signed)
Internal Medicine Clinic Attending  Case discussed with Dr. Sanjuana Mae  at the time of the visit.  We reviewed the resident's history and exam and pertinent patient test results.  I agree with the assessment, diagnosis, and plan of care documented in the resident's note.

## 2022-05-25 ENCOUNTER — Ambulatory Visit (INDEPENDENT_AMBULATORY_CARE_PROVIDER_SITE_OTHER): Payer: 59 | Admitting: Student

## 2022-05-25 ENCOUNTER — Encounter: Payer: Self-pay | Admitting: Student

## 2022-05-25 ENCOUNTER — Other Ambulatory Visit: Payer: Self-pay

## 2022-05-25 VITALS — BP 111/58 | HR 65 | Temp 98.1°F

## 2022-05-25 DIAGNOSIS — I872 Venous insufficiency (chronic) (peripheral): Secondary | ICD-10-CM

## 2022-05-25 NOTE — Progress Notes (Signed)
CC: Unna boot replacement  HPI:  [name] is a 72 y.o. female living with a history stated below and presents today for [employer] boot replacement. Please see problem based assessment and plan for additional details.  Past Medical History:  Diagnosis Date   A-fib ([place])    Acute hypoxemic respiratory failure ([place]) 02/20/2022   Acute on chronic hypoxic respiratory failure ([place]) 01/04/2022   Acute upper respiratory infection 01/08/2022   Anxiety    Arthritis    "qwhre; joints, back" (04/17/2017)   Atrial fibrillation (HCC)    Benign breast cyst in female, left 01/08/2017   Found by Screening mammogram, evaluated by U/S on 01/08/17 and determined to be a benign simple breast cyst.   Breast cancer ([place])    Cellulitis of left lower leg 05/30/2017   CHF (congestive heart failure) (HCC)    Chronic low back pain 08/21/2016   Chronic lower back pain    Chronic venous insufficiency    /notes 05/30/2017   COPD (chronic obstructive pulmonary disease) ([place])    Depression    Diabetes ([place]) 01/08/2022   Diabetes mellitus without complication ([place])    DVT (deep venous thrombosis) ([place]) 11/16/2016   Dysrhythmia    Esophageal dysmotility 11/10/2019   Previous workup by ENT with fiberoptic leryngoscopy which was normal. Dx with laryngeal pharyngeal reflux.   GERD (gastroesophageal reflux disease)    Headache    "weekly for the last 3 months" (04/17/2017)   History of pulmonary embolism    Pulmonary embolism   Hyperlipidemia    Hypertension    Laryngopharyngeal reflux 12/18/2015   Morbid obesity ([place])    PE (pulmonary embolism)    Pulmonary embolism ([place]) 09/21/2014   Sleep apnea    Stroke ([place]) 12/02/2021    Current Outpatient Medications on File Prior to Visit  Medication Sig Dispense Refill   albuterol (VENTOLIN HFA) 108 (90 Base) MCG/ACT inhaler Inhale 2 puffs into the lungs every 4 (four) hours as needed for shortness of breath. 18 g 0   aspirin EC 81 MG tablet Take 81 mg by  mouth daily.     atorvastatin (LIPITOR) 80 MG tablet TAKE 1 TABLET BY MOUTH EVERY DAY (Patient taking differently: Take 80 mg by mouth daily.) 90 tablet 3   esomeprazole (NEXIUM) 40 MG capsule Take 1 capsule (40 mg total) by mouth daily as needed (for heartburn or indigestion). (Patient taking differently: Take 40 mg by mouth daily.) 90 capsule 1   Fluticasone-Umeclidin-Vilant (TRELEGY ELLIPTA) 100-62.5-25 MCG/ACT AEPB Inhale 1 puff into the lungs daily. 60 each 5   gabapentin (NEURONTIN) 400 MG capsule TAKE 1 CAPSULE BY MOUTH  TWICE DAILY (Patient taking differently: Take 400 mg by mouth at bedtime.) 200 capsule 2   ibuprofen (ADVIL) 200 MG tablet Take 800 mg by mouth daily as needed for moderate pain.     ipratropium-albuterol (DUONEB) 0.5-2.5 (3) MG/3ML SOLN Take 3 mLs by nebulization every 6 (six) hours as needed. (Patient taking differently: Take 3 mLs by nebulization every 6 (six) hours as needed (shortness of breath).) 360 mL 3   letrozole (FEMARA) 2.5 MG tablet TAKE 1 TABLET(2.5 MG) BY MOUTH DAILY (Patient taking differently: Take 2.5 mg by mouth daily.) 90 tablet 3   losartan (COZAAR) 25 MG tablet Take 1 tablet (25 mg total) by mouth daily. 30 tablet 11   metoprolol succinate (TOPROL-XL) 25 MG 24 hr tablet TAKE 1 TABLET BY MOUTH DAILY 100 tablet 2   MYRBETRIQ 50 MG TB24 tablet  TAKE 1 TABLET BY MOUTH DAILY 100 tablet 2   naloxone (NARCAN) nasal spray 4 mg/0.1 mL Place 1 spray into the nose once as needed (for overdose suspected). for opioid overdose     oxyCODONE (ROXICODONE) 15 MG immediate release tablet Take 15 mg by mouth every 6 (six) hours as needed for pain.     OXYGEN Inhale 2-3 L into the lungs daily.     potassium chloride SA (KLOR-CON M) 20 MEQ tablet TAKE 1 TABLET BY MOUTH DAILY 90 tablet 0   PRESCRIPTION MEDICATION Inhale into the lungs at bedtime. cpap     rivaroxaban (XARELTO) 20 MG TABS tablet Take 1 tablet (20 mg total) by mouth every morning. 90 tablet 3   Semaglutide,0.25  or 0.5MG/DOS, 2 MG/3ML SOPN Inject 0.5 Units into the skin once a week. 3 mL 2   torsemide (DEMADEX) 20 MG tablet Take 2 tablets by mouth daily, you may take an extra 1/2 tablet only as needed for weight gain of 2 lbs overnight or 5 lbs in a week (Patient taking differently: Take 20 mg by mouth daily as needed (for legs fluid).) 225 tablet 1   No current facility-administered medications on file prior to visit.    Family History  Problem Relation Age of Onset   Breast cancer Mother        before age 23   Hypertension Mother    Hyperlipidemia Mother    Hypertension Maternal Grandfather    Hyperlipidemia Maternal Grandfather     Social History   Socioeconomic History   Marital status: Divorced    Spouse name: Not on file   Number of children: Not on file   Years of education: Not on file   Highest education level: Not on file  Occupational History   Not on file  Tobacco Use   Smoking status: Former    Packs/day: 2.00    Years: 55.00    Total pack years: 110.00    Types: Cigarettes    Start date: 08/16/1961    Quit date: 01/02/2022    Years since quitting: 0.3   Smokeless tobacco: Never   Tobacco comments:    Pt smokes about 4 ciggs daily. Stopped 01/02/2022   Vaping Use   Vaping Use: Never used  Substance and Sexual Activity   Alcohol use: No   Drug use: No   Sexual activity: Not Currently    Partners: Male  Other Topics Concern   Not on file  Social History Narrative   Lives in [facility] senior [facility] in [place]. Lives alone, but is dependent in ADLs/IADLs. 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 Two daughters live nearby. Previously resided in [place].   Social Determinants of Health   Financial Resource Strain: Medium Risk (12/02/2021)   Overall Financial Resource Strain (CARDIA)    Difficulty of Paying Living Expenses: Somewhat hard  Food Insecurity: No Food Insecurity (05/17/2022)   Hunger Vital Sign    Worried About Running  Out of Food in the Last Year: Never true    Ran Out of Food in the Last Year: Never true  Transportation Needs: No Transportation Needs (03/15/2022)   PRAPARE - Hydrologist (Medical): No    Lack of Transportation (Non-Medical): No  Physical Activity: Inactive (12/02/2021)   Exercise Vital Sign    Days of Exercise per Week: 0 days    Minutes of Exercise per Session: 0 min  Stress: No Stress Concern Present (12/02/2021)   [place]    Feeling of Stress : Only a little  Social Connections: Moderately Integrated (03/15/2022)   Social Connection and Isolation Panel [NHANES]    Frequency of Communication with Friends and Family: More than three times a week    Frequency of Social Gatherings with Friends and Family: More than three times a week    Attends Religious Services: More than 4 times per year    Active Member of [employer] or Organizations: Yes    Attends Music therapist: More than 4 times per year    Marital Status: Divorced  Intimate Partner Violence: Not At Risk (03/15/2022)   Humiliation, Afraid, Rape, and Kick questionnaire    Fear of Current or Ex-Partner: No    Emotionally Abused: No    Physically Abused: No    Sexually Abused: No    Review of Systems: ROS negative except for what is noted on the assessment and plan.  Vitals:   05/25/22 0957  BP: (!) 111/58  Pulse: 65  Temp: 98.1 F (36.7 C)  TempSrc: Oral  SpO2: 98%    Physical Exam: Constitutional: Obese woman in motorized chair with oxygen tank  HENT: normocephalic atraumatic, mucous membranes moist Eyes: conjunctiva non-erythematous Neck: supple Cardiovascular: regular rate and rhythm, no m/r/g Pulmonary/Chest: normal work of breathing on room air, lungs clear to auscultation bilaterally Skin: warm and dry, chronic dry skin of bilateral shins extending up to her knees consistent with venous changes.  No  active ulcers or bleeding   Assessment & Plan:   Chronic venous insufficiency Patient has history of chronic venous insufficiency, and Unna boots were originally placed on February 6 and replaced on February 16 as well.  She still has trouble using compression stockings as she has difficulty using them, and is worried if her legs do expand while she is wearing them and will be difficult for her to get them off as nobody lives with her.  She currently denies any chest pain, shortness of breath, or worsening  leg pain.  Will reevaluate next week at her next visit and see if she can tolerate compression stockings.  Plan: - Unna boots were placed today - Will replace next week as well, and evaluate if compression stockings are suitable.   Patient discussed with Dr. Hayden Rasmussen Han Vejar, M.D. [facility], PGY-1 Phone: [phone] Date 05/25/2022 Time 11:22 AM

## 2022-05-25 NOTE — Patient Instructions (Signed)
Thank you so much for coming to the clinic today!    I'm pleased with the progress the Unna boots are providing, I'm also glad that your symptoms are getting better. We will see you next week!    If you have any questions please feel free to the call the clinic at anytime at [phone]. It was a pleasure seeing you!  Best, Dr. Sanjuana Mae

## 2022-05-25 NOTE — Assessment & Plan Note (Signed)
Patient has history of chronic venous insufficiency, and Unna boots were originally placed on February 6 and replaced on February 16 as well.  She still has trouble using compression stockings as she has difficulty using them, and is worried if her legs do expand while she is wearing them and will be difficult for her to get them off as nobody lives with her.  She currently denies any chest pain, shortness of breath, or worsening  leg pain.  Will reevaluate next week at her next visit and see if she can tolerate compression stockings.  Plan: - Unna boots were placed today - Will replace next week as well, and evaluate if compression stockings are suitable.

## 2022-05-26 DIAGNOSIS — I10 Essential (primary) hypertension: Secondary | ICD-10-CM | POA: Diagnosis not present

## 2022-05-26 DIAGNOSIS — M25562 Pain in left knee: Secondary | ICD-10-CM | POA: Diagnosis not present

## 2022-05-26 DIAGNOSIS — I509 Heart failure, unspecified: Secondary | ICD-10-CM | POA: Diagnosis not present

## 2022-05-26 DIAGNOSIS — G8929 Other chronic pain: Secondary | ICD-10-CM | POA: Diagnosis not present

## 2022-05-26 DIAGNOSIS — R030 Elevated blood-pressure reading, without diagnosis of hypertension: Secondary | ICD-10-CM | POA: Diagnosis not present

## 2022-05-26 DIAGNOSIS — E119 Type 2 diabetes mellitus without complications: Secondary | ICD-10-CM | POA: Diagnosis not present

## 2022-05-26 DIAGNOSIS — J449 Chronic obstructive pulmonary disease, unspecified: Secondary | ICD-10-CM | POA: Diagnosis not present

## 2022-05-26 DIAGNOSIS — J439 Emphysema, unspecified: Secondary | ICD-10-CM | POA: Diagnosis not present

## 2022-05-26 DIAGNOSIS — I4891 Unspecified atrial fibrillation: Secondary | ICD-10-CM | POA: Diagnosis not present

## 2022-05-26 DIAGNOSIS — M25561 Pain in right knee: Secondary | ICD-10-CM | POA: Diagnosis not present

## 2022-05-26 DIAGNOSIS — Z79899 Other long term (current) drug therapy: Secondary | ICD-10-CM | POA: Diagnosis not present

## 2022-05-26 NOTE — Progress Notes (Signed)
Internal Medicine Clinic Attending  Case discussed with Dr. Sanjuana Mae  At the time of the visit.  We reviewed the resident's history and exam and pertinent patient test results.  I agree with the assessment, diagnosis, and plan of care documented in the resident's note.

## 2022-05-30 ENCOUNTER — Ambulatory Visit (HOSPITAL_COMMUNITY)
Admission: RE | Admit: 2022-05-30 | Discharge: 2022-05-30 | Disposition: A | Discharge status: Home / Self Care | Payer: 59 | Source: Ambulatory Visit | Attending: Internal Medicine | Admitting: Internal Medicine

## 2022-05-30 DIAGNOSIS — Z87891 Personal history of nicotine dependence: Secondary | ICD-10-CM | POA: Diagnosis not present

## 2022-05-30 DIAGNOSIS — Z0000 Encounter for general adult medical examination without abnormal findings: Secondary | ICD-10-CM | POA: Diagnosis present

## 2022-05-30 DIAGNOSIS — Z122 Encounter for screening for malignant neoplasm of respiratory organs: Secondary | ICD-10-CM | POA: Diagnosis not present

## 2022-05-30 DIAGNOSIS — I2510 Atherosclerotic heart disease of native coronary artery without angina pectoris: Secondary | ICD-10-CM | POA: Diagnosis not present

## 2022-05-30 DIAGNOSIS — I700 Atherosclerosis of aorta: Secondary | ICD-10-CM | POA: Diagnosis not present

## 2022-05-31 ENCOUNTER — Encounter (HOSPITAL_BASED_OUTPATIENT_CLINIC_OR_DEPARTMENT_OTHER): Payer: Self-pay | Admitting: Pulmonary Disease

## 2022-05-31 ENCOUNTER — Ambulatory Visit (INDEPENDENT_AMBULATORY_CARE_PROVIDER_SITE_OTHER): Payer: 59 | Admitting: Pulmonary Disease

## 2022-05-31 VITALS — BP 130/74 | HR 83 | Temp 98.0°F

## 2022-05-31 DIAGNOSIS — J9611 Chronic respiratory failure with hypoxia: Secondary | ICD-10-CM | POA: Diagnosis not present

## 2022-05-31 DIAGNOSIS — G4733 Obstructive sleep apnea (adult) (pediatric): Secondary | ICD-10-CM | POA: Diagnosis not present

## 2022-05-31 DIAGNOSIS — J432 Centrilobular emphysema: Secondary | ICD-10-CM | POA: Diagnosis not present

## 2022-05-31 DIAGNOSIS — Z720 Tobacco use: Secondary | ICD-10-CM

## 2022-05-31 NOTE — Assessment & Plan Note (Signed)
CPAP download was reviewed which shows excellent control of events on 13 cm with minimal leak.  She is very compliant more than 7 hours every night. CPAP is only helped improve her daytime somnolence and fatigue. She will check with DME if she is eligible for replacement  Weight loss encouraged, compliance with goal of at least 4-6 hrs every night is the expectation. Advised against medications with sedative side effects Cautioned against driving when sleepy - understanding that sleepiness will vary on a day to day basis

## 2022-05-31 NOTE — Assessment & Plan Note (Signed)
Congratulated her on smoking cessation in October. Low-dose CT scan has been obtained and results are pending

## 2022-05-31 NOTE — Assessment & Plan Note (Signed)
She uses POC when she is outside and during sleep and into her CPAP

## 2022-05-31 NOTE — Progress Notes (Signed)
Subjective:    Patient ID: Sharon Vasquez, female    DOB: [birth year], 72 y.o.   [name]: [id number]  HPI  72yo morbidly obese smoker for follow-up of ILD and OSA She smoked up to 2 packs/day,  now decreased to about 4-5 cigarettes daily HRCT suggest "alternative" diagnosis- RB ILD versus hypersensitivity pneumonitis    PMH - chronic diastolic heart failure.   -pulmonary embolism/ LLE DVT 2015  on Xarelto  s/p venoplasty and stenting of left common iliac and external iliac vein Duplex 05/2017 did not show any residual stenosis. -on Xarelto for chronic atrial fibrillation , amiodarone stopped 10/2019  Chief Complaint  Patient presents with   Follow-up    Pt states she is about the same since last visit. Denies any real complaints.   31-monthfollow-up visit. She arrives in her motorized scooter.  She requests a handicap placard. She finally quit smoking October 2023. She had an ED visit 04/26/2022 for hypoxia and hypertension, improved with diuresis. Chest x-ray showed cardiomegaly with effusions consistent with CHF. She was hospitalized in October and has also had 3 other ED visits for COPD exacerbation.  She reports compliance with diuretics.  She has a pure wick which she uses at home. She also has fluid blisters on her legs and has her legs wrapped today  She had a low-dose CT chest done yesterday and this has not been reported yet Reports compliance with Trelegy. No problems with mask or pressure.  She is good about using this every night she had some problem with the cord on her CPAP and this has been changed out by DME  Significant tests/ events reviewed    Serology 06/2021 ANA, CCP neg, ACE 30, HSP neg  HRCT 05/2021  " alternative " mild progression compared to the prior study and extensive air trapping,  favor chronic hypersensitivity pneumonitis   HRCT 05/2020 >> probable RB ILD, no worsening, enlarged pulmonary artery 4.4 cm, 4.4 cm ascending aortic aneurysm   HRCT 10/2019  >> Mild patchy ground-glass opacity and minimal subpleural reticulation without bronchiectasis or honeycombing. Extensive air trapping >> RBILD, enlarged PA   HRCT 12/2017 >> Mild interlobular septal thickening and mild patchy peribronchovascular ground-glass opacity in both lungs. Minimal patchy subpleural reticulation in the upper lobes ? Pul edema vs RB-ILD   PFTs 03/2021 moderate restriction, ratio 89, FEV1 74%, FVC 65%, TLC 54%, DLCO 15.3/62%   PFTs 12/2017 - No obstruction, mild-mod restriction, decreased DLCO  FVC 2.16 (66%), FEV1 1.93 (75%), ratio 89, DLCOcor 16.45 (48%)   FENO 12/2017 5   Echo 10/2020 moderate LVH, RVSP 37  ECHO 07/2016 - EF 60-65%, PA peak pressure 262mHg   HST 12/2017 >> AHI 20/h   Esophagram 01/2020 >> Smooth strictured narrowing involving the distal esophagus near the GE junction  Review of Systems neg for any significant sore throat, dysphagia, itching, sneezing, nasal congestion or excess/ purulent secretions, fever, chills, sweats, unintended wt loss, pleuritic or exertional cp, hempoptysis, orthopnea pnd or change in chronic leg swelling. Also denies presyncope, palpitations, heartburn, abdominal pain, nausea, vomiting, diarrhea or change in bowel or urinary habits, dysuria,hematuria, rash, arthralgias, visual complaints, headache, numbness weakness or ataxia.     Objective:   Physical Exam  Gen. Pleasant, obese, in no distress, in scooter ENT - no lesions, no post nasal drip Neck: No JVD, no thyromegaly, no carotid bruits Lungs: no use of accessory muscles, no dullness to percussion, decreased without rales or rhonchi  Cardiovascular: Rhythm regular,  heart sounds  normal, no murmurs or gallops, 2+ peripheral edema Musculoskeletal: No deformities, no cyanosis or clubbing , no tremors, legs wrapped ACE bandage       Assessment & Plan:

## 2022-05-31 NOTE — Assessment & Plan Note (Signed)
Continue Trelegy. Use albuterol for rescue. We discussed signs and symptoms of COPD exacerbation and action plan for the same.  The main issue seems to be HFpEF.  She is on a good diuretic regimen but cannot rely on weights.  Also she is unable to use her PureWick on a regular basis due to intensity of diuresis and this seems to be the main reason for recurrent hospitalization

## 2022-05-31 NOTE — Patient Instructions (Signed)
CPAP is working well  Handicap form filled out

## 2022-06-01 ENCOUNTER — Other Ambulatory Visit: Payer: Self-pay

## 2022-06-01 ENCOUNTER — Ambulatory Visit (INDEPENDENT_AMBULATORY_CARE_PROVIDER_SITE_OTHER): Payer: 59 | Admitting: Student

## 2022-06-01 ENCOUNTER — Encounter: Payer: Self-pay | Admitting: Student

## 2022-06-01 VITALS — BP 134/86 | HR 85 | Temp 97.5°F | Ht 74.0 in | Wt 365.5 lb

## 2022-06-01 DIAGNOSIS — I872 Venous insufficiency (chronic) (peripheral): Secondary | ICD-10-CM

## 2022-06-01 DIAGNOSIS — Z79899 Other long term (current) drug therapy: Secondary | ICD-10-CM | POA: Diagnosis not present

## 2022-06-01 NOTE — Patient Instructions (Signed)
Thank you so much for coming to the clinic today!   I'm glad the Unna boots are helping you! Consider looking into a different type of compression called Juxtalite, which uses velcro wraps to strap in once it's on, and it's proven to be easier to take off and on.   If you have any questions please feel free to the call the clinic at anytime at [phone]. It was a pleasure seeing you!  Best, Dr. Sanjuana Mae

## 2022-06-02 NOTE — Assessment & Plan Note (Addendum)
Patient has a history of chronic venous insufficiency and has multiple comorbidities such as morbid obesity heart failure with preserved ejection fraction hyperlipidemia and history of tobacco use who presents to change her Unna boots.  She says she is doing well, and Unna boots are helping her significantly keep the swelling down.  She still is not amenable to using compression stockings as she lives by herself and has had much difficulty using them previously.  She is worried that if she does get them on there will be nobody to help get them off they do get stuck.  On physical exam, edema is present however it is improving and patient bilateral lower extremities are nontender to palpation.    Recommended patient to look into type of compression called juxta light, which could ease the compression and not require her to return to the clinic weekly.  Gave patient information about it and she will look into it.  In the meanwhile, will replace Unna boots.

## 2022-06-02 NOTE — Progress Notes (Signed)
CC: Unna boot replacement  HPI:  [name] is a 72 y.o. female living with a history stated below and presents today for [employer] boot replacement. Please see problem based assessment and plan for additional details.  Past Medical History:  Diagnosis Date   A-fib ([place])    Acute hypoxemic respiratory failure ([place]) 02/20/2022   Acute on chronic hypoxic respiratory failure ([place]) 01/04/2022   Acute upper respiratory infection 01/08/2022   Anxiety    Arthritis    "qwhre; joints, back" (04/17/2017)   Atrial fibrillation (HCC)    Benign breast cyst in female, left 01/08/2017   Found by Screening mammogram, evaluated by U/S on 01/08/17 and determined to be a benign simple breast cyst.   Breast cancer ([place])    Cellulitis of left lower leg 05/30/2017   CHF (congestive heart failure) (HCC)    Chronic low back pain 08/21/2016   Chronic lower back pain    Chronic venous insufficiency    /notes 05/30/2017   COPD (chronic obstructive pulmonary disease) ([place])    Depression    Diabetes ([place]) 01/08/2022   Diabetes mellitus without complication ([place])    DVT (deep venous thrombosis) ([place]) 11/16/2016   Dysrhythmia    Esophageal dysmotility 11/10/2019   Previous workup by ENT with fiberoptic leryngoscopy which was normal. Dx with laryngeal pharyngeal reflux.   GERD (gastroesophageal reflux disease)    Headache    "weekly for the last 3 months" (04/17/2017)   History of pulmonary embolism    Pulmonary embolism   Hyperlipidemia    Hypertension    Laryngopharyngeal reflux 12/18/2015   Morbid obesity ([place])    PE (pulmonary embolism)    Pulmonary embolism ([place]) 09/21/2014   Sleep apnea    Stroke ([place]) 12/02/2021    Current Outpatient Medications on File Prior to Visit  Medication Sig Dispense Refill   albuterol (VENTOLIN HFA) 108 (90 Base) MCG/ACT inhaler Inhale 2 puffs into the lungs every 4 (four) hours as needed for shortness of breath. 18 g 0   aspirin EC 81 MG tablet Take 81 mg by  mouth daily.     atorvastatin (LIPITOR) 80 MG tablet TAKE 1 TABLET BY MOUTH EVERY DAY (Patient taking differently: Take 80 mg by mouth daily.) 90 tablet 3   esomeprazole (NEXIUM) 40 MG capsule Take 1 capsule (40 mg total) by mouth daily as needed (for heartburn or indigestion). (Patient taking differently: Take 40 mg by mouth daily.) 90 capsule 1   Fluticasone-Umeclidin-Vilant (TRELEGY ELLIPTA) 100-62.5-25 MCG/ACT AEPB Inhale 1 puff into the lungs daily. 60 each 5   gabapentin (NEURONTIN) 400 MG capsule TAKE 1 CAPSULE BY MOUTH  TWICE DAILY (Patient taking differently: Take 400 mg by mouth at bedtime.) 200 capsule 2   ibuprofen (ADVIL) 200 MG tablet Take 800 mg by mouth daily as needed for moderate pain.     ipratropium-albuterol (DUONEB) 0.5-2.5 (3) MG/3ML SOLN Take 3 mLs by nebulization every 6 (six) hours as needed. (Patient taking differently: Take 3 mLs by nebulization every 6 (six) hours as needed (shortness of breath).) 360 mL 3   letrozole (FEMARA) 2.5 MG tablet TAKE 1 TABLET(2.5 MG) BY MOUTH DAILY (Patient taking differently: Take 2.5 mg by mouth daily.) 90 tablet 3   losartan (COZAAR) 25 MG tablet Take 1 tablet (25 mg total) by mouth daily. 30 tablet 11   metoprolol succinate (TOPROL-XL) 25 MG 24 hr tablet TAKE 1 TABLET BY MOUTH DAILY 100 tablet 2   MYRBETRIQ 50 MG TB24 tablet  TAKE 1 TABLET BY MOUTH DAILY 100 tablet 2   naloxone (NARCAN) nasal spray 4 mg/0.1 mL Place 1 spray into the nose once as needed (for overdose suspected). for opioid overdose     oxyCODONE (ROXICODONE) 15 MG immediate release tablet Take 15 mg by mouth every 6 (six) hours as needed for pain.     OXYGEN Inhale 2-3 L into the lungs daily.     potassium chloride SA (KLOR-CON M) 20 MEQ tablet TAKE 1 TABLET BY MOUTH DAILY 90 tablet 0   PRESCRIPTION MEDICATION Inhale into the lungs at bedtime. cpap     rivaroxaban (XARELTO) 20 MG TABS tablet Take 1 tablet (20 mg total) by mouth every morning. 90 tablet 3   Semaglutide,0.25  or 0.'5MG'$ /DOS, 2 MG/3ML SOPN Inject 0.5 Units into the skin once a week. 3 mL 2   torsemide (DEMADEX) 20 MG tablet Take 2 tablets by mouth daily, you may take an extra 1/2 tablet only as needed for weight gain of 2 lbs overnight or 5 lbs in a week (Patient taking differently: Take 20 mg by mouth daily as needed (for legs fluid).) 225 tablet 1   No current facility-administered medications on file prior to visit.    Family History  Problem Relation Age of Onset   Breast cancer Mother        before age 37   Hypertension Mother    Hyperlipidemia Mother    Hypertension Maternal Grandfather    Hyperlipidemia Maternal Grandfather     Social History   Socioeconomic History   Marital status: Divorced    Spouse name: Not on file   Number of children: Not on file   Years of education: Not on file   Highest education level: Not on file  Occupational History   Not on file  Tobacco Use   Smoking status: Former    Packs/day: 2.00    Years: 55.00    Total pack years: 110.00    Types: Cigarettes    Start date: 08/16/1961    Quit date: 01/02/2022    Years since quitting: 0.4   Smokeless tobacco: Never   Tobacco comments:    Pt smokes about 4 ciggs daily. Stopped 01/02/2022   Vaping Use   Vaping Use: Never used  Substance and Sexual Activity   Alcohol use: No   Drug use: No   Sexual activity: Not Currently    Partners: Male  Other Topics Concern   Not on file  Social History Narrative   Lives in [facility] in [place]. Lives alone, but is dependent in ADLs/IADLs. Previously had [facility] and RN but dismissed them with plans to use the [employer]. Two daughters live nearby. Previously resided in [place].   Social Determinants of Health   Financial Resource Strain: Medium Risk (12/02/2021)   Overall Financial Resource Strain (CARDIA)    Difficulty of Paying Living Expenses: Somewhat hard  Food Insecurity: No Food Insecurity (05/17/2022)   Hunger Vital Sign    Worried About Running  Out of Food in the Last Year: Never true    Ran Out of Food in the Last Year: Never true  Transportation Needs: No Transportation Needs (03/15/2022)   PRAPARE - Hydrologist (Medical): No    Lack of Transportation (Non-Medical): No  Physical Activity: Inactive (12/02/2021)   Exercise Vital Sign    Days of Exercise per Week: 0 days    Minutes of Exercise per Session: 0 min  Stress: No Stress Concern Present (12/02/2021)   [place]    Feeling of Stress : Only a little  Social Connections: Moderately Integrated (03/15/2022)   Social Connection and Isolation Panel [NHANES]    Frequency of Communication with Friends and Family: More than three times a week    Frequency of Social Gatherings with Friends and Family: More than three times a week    Attends Religious Services: More than 4 times per year    Active Member of [employer] or Organizations: Yes    Attends Music therapist: More than 4 times per year    Marital Status: Divorced  Intimate Partner Violence: Not At Risk (03/15/2022)   Humiliation, Afraid, Rape, and Kick questionnaire    Fear of Current or Ex-Partner: No    Emotionally Abused: No    Physically Abused: No    Sexually Abused: No    Review of Systems: ROS negative except for what is noted on the assessment and plan.  Vitals:   06/01/22 1353  BP: 134/86  Pulse: 85  Temp: (!) 97.5 F (36.4 C)  TempSrc: Oral  SpO2: 99%  Weight: (!) 365 lb 8 oz (165.8 kg)  Height: '6\' 2"'$  (1.88 m)    Physical Exam: Constitutional: Obese woman in motorized chair Cardiovascular: regular rate and rhythm, no m/r/g Pulmonary/Chest: normal work of breathing on room air, lungs clear to auscultation bilaterally Skin: warm and dry, chronic dry skin of bilateral shins extending up to her knees consistent with venous changes.  No active ulcers or bleeding   Assessment & Plan:   Chronic  venous insufficiency Patient has a history of chronic venous insufficiency and has multiple comorbidities such as morbid obesity heart failure with preserved ejection fraction hyperlipidemia and history of tobacco use who presents to change her Unna boots.  She says she is doing well, and Unna boots are helping her significantly keep the swelling down.  She still is not amenable to using compression stockings as she lives by herself and has had much difficulty using them previously.  She is worried that if she does get them on there will be nobody to help get them off they do get stuck.  On physical exam, edema is present however it is improving and patient bilateral lower extremities are nontender to palpation.    Recommended patient to look into type of compression called juxta light, which could ease the compression and not require her to return to the clinic weekly.  Gave patient information about it and she will look into it.  In the meanwhile, will replace Unna boots.  Patient discussed with Dr. Thomasene Ripple, M.D. [facility], PGY-1 Phone: [phone] Date 06/02/2022 Time 9:09 AM

## 2022-06-05 NOTE — Progress Notes (Signed)
Internal Medicine Clinic Attending  Case discussed with the resident at the time of the visit.  We reviewed the resident's history and exam and pertinent patient test results.  I agree with the assessment, diagnosis, and plan of care documented in the resident's note.  

## 2022-06-08 ENCOUNTER — Encounter: Payer: 59 | Admitting: Student

## 2022-06-11 DIAGNOSIS — M170 Bilateral primary osteoarthritis of knee: Secondary | ICD-10-CM | POA: Diagnosis not present

## 2022-06-11 DIAGNOSIS — G4733 Obstructive sleep apnea (adult) (pediatric): Secondary | ICD-10-CM | POA: Diagnosis not present

## 2022-06-12 NOTE — Progress Notes (Signed)
Internal Medicine Clinic Attending  Case and documentation of Dr. Atway reviewed.  I reviewed the AWV findings.  I agree with the assessment, diagnosis, and plan of care documented in the AWV note.     

## 2022-06-13 ENCOUNTER — Other Ambulatory Visit: Payer: Self-pay | Admitting: Hematology and Oncology

## 2022-06-16 ENCOUNTER — Other Ambulatory Visit: Payer: Self-pay | Admitting: Internal Medicine

## 2022-06-18 DIAGNOSIS — J449 Chronic obstructive pulmonary disease, unspecified: Secondary | ICD-10-CM | POA: Diagnosis not present

## 2022-06-23 DIAGNOSIS — J439 Emphysema, unspecified: Secondary | ICD-10-CM | POA: Diagnosis not present

## 2022-06-23 DIAGNOSIS — I509 Heart failure, unspecified: Secondary | ICD-10-CM | POA: Diagnosis not present

## 2022-06-23 DIAGNOSIS — R030 Elevated blood-pressure reading, without diagnosis of hypertension: Secondary | ICD-10-CM | POA: Diagnosis not present

## 2022-06-23 DIAGNOSIS — E119 Type 2 diabetes mellitus without complications: Secondary | ICD-10-CM | POA: Diagnosis not present

## 2022-06-23 DIAGNOSIS — Z79899 Other long term (current) drug therapy: Secondary | ICD-10-CM | POA: Diagnosis not present

## 2022-06-23 DIAGNOSIS — G8929 Other chronic pain: Secondary | ICD-10-CM | POA: Diagnosis not present

## 2022-06-23 DIAGNOSIS — M25561 Pain in right knee: Secondary | ICD-10-CM | POA: Diagnosis not present

## 2022-06-23 DIAGNOSIS — I4891 Unspecified atrial fibrillation: Secondary | ICD-10-CM | POA: Diagnosis not present

## 2022-06-23 DIAGNOSIS — J449 Chronic obstructive pulmonary disease, unspecified: Secondary | ICD-10-CM | POA: Diagnosis not present

## 2022-06-23 DIAGNOSIS — M25562 Pain in left knee: Secondary | ICD-10-CM | POA: Diagnosis not present

## 2022-06-23 DIAGNOSIS — I10 Essential (primary) hypertension: Secondary | ICD-10-CM | POA: Diagnosis not present

## 2022-06-27 DIAGNOSIS — Z79899 Other long term (current) drug therapy: Secondary | ICD-10-CM | POA: Diagnosis not present

## 2022-07-04 ENCOUNTER — Other Ambulatory Visit: Payer: Self-pay | Admitting: Internal Medicine

## 2022-07-04 DIAGNOSIS — G894 Chronic pain syndrome: Secondary | ICD-10-CM

## 2022-07-10 ENCOUNTER — Other Ambulatory Visit: Payer: Self-pay

## 2022-07-10 DIAGNOSIS — J439 Emphysema, unspecified: Secondary | ICD-10-CM

## 2022-07-10 MED ORDER — ALBUTEROL SULFATE HFA 108 (90 BASE) MCG/ACT IN AERS: 2.0000 | INHALATION_SPRAY | RESPIRATORY_TRACT | 1 refills | Status: AC | PRN

## 2022-07-11 NOTE — Progress Notes (Signed)
CT chest normal. Repeat screen in 1 year

## 2022-07-12 DIAGNOSIS — G4733 Obstructive sleep apnea (adult) (pediatric): Secondary | ICD-10-CM | POA: Diagnosis not present

## 2022-07-12 DIAGNOSIS — M170 Bilateral primary osteoarthritis of knee: Secondary | ICD-10-CM | POA: Diagnosis not present

## 2022-07-19 DIAGNOSIS — J449 Chronic obstructive pulmonary disease, unspecified: Secondary | ICD-10-CM | POA: Diagnosis not present

## 2022-07-27 DIAGNOSIS — Z79899 Other long term (current) drug therapy: Secondary | ICD-10-CM | POA: Diagnosis not present

## 2022-07-27 DIAGNOSIS — J439 Emphysema, unspecified: Secondary | ICD-10-CM | POA: Diagnosis not present

## 2022-07-27 DIAGNOSIS — J449 Chronic obstructive pulmonary disease, unspecified: Secondary | ICD-10-CM | POA: Diagnosis not present

## 2022-07-27 DIAGNOSIS — M25562 Pain in left knee: Secondary | ICD-10-CM | POA: Diagnosis not present

## 2022-07-27 DIAGNOSIS — I509 Heart failure, unspecified: Secondary | ICD-10-CM | POA: Diagnosis not present

## 2022-07-27 DIAGNOSIS — I4891 Unspecified atrial fibrillation: Secondary | ICD-10-CM | POA: Diagnosis not present

## 2022-07-27 DIAGNOSIS — R030 Elevated blood-pressure reading, without diagnosis of hypertension: Secondary | ICD-10-CM | POA: Diagnosis not present

## 2022-07-27 DIAGNOSIS — I10 Essential (primary) hypertension: Secondary | ICD-10-CM | POA: Diagnosis not present

## 2022-07-27 DIAGNOSIS — C50911 Malignant neoplasm of unspecified site of right female breast: Secondary | ICD-10-CM | POA: Diagnosis not present

## 2022-07-27 DIAGNOSIS — G8929 Other chronic pain: Secondary | ICD-10-CM | POA: Diagnosis not present

## 2022-07-27 DIAGNOSIS — M25561 Pain in right knee: Secondary | ICD-10-CM | POA: Diagnosis not present

## 2022-07-31 DIAGNOSIS — Z79899 Other long term (current) drug therapy: Secondary | ICD-10-CM | POA: Diagnosis not present

## 2022-08-11 DIAGNOSIS — G4733 Obstructive sleep apnea (adult) (pediatric): Secondary | ICD-10-CM | POA: Diagnosis not present

## 2022-08-15 ENCOUNTER — Other Ambulatory Visit: Payer: Self-pay

## 2022-08-15 ENCOUNTER — Encounter: Payer: Self-pay | Admitting: Student

## 2022-08-15 ENCOUNTER — Ambulatory Visit (INDEPENDENT_AMBULATORY_CARE_PROVIDER_SITE_OTHER): Payer: 59 | Admitting: Student

## 2022-08-15 VITALS — BP 159/76 | HR 70 | Temp 98.0°F | Ht 74.0 in

## 2022-08-15 DIAGNOSIS — I10 Essential (primary) hypertension: Secondary | ICD-10-CM | POA: Diagnosis not present

## 2022-08-15 DIAGNOSIS — I872 Venous insufficiency (chronic) (peripheral): Secondary | ICD-10-CM

## 2022-08-15 DIAGNOSIS — M170 Bilateral primary osteoarthritis of knee: Secondary | ICD-10-CM

## 2022-08-15 NOTE — Assessment & Plan Note (Addendum)
Patient presents with history of chronic venous insufficiency.  States bilateral LE swelling has been unchanged and not significantly improved. Denies any chest pain or worsening of shortness of breath. She prefers not using compression stockings due to difficulty using them.  Last visit provided information regarding juxta lite which she has not followed up on. Exam showed bilateral non-pitting LE edema with chronic skin changes. No open wounds at this time. Got information that [facility] would be able to fit and provide the juxta lite compression.   Plan -provided information for [facility] to obtain juxta lite compression

## 2022-08-15 NOTE — Assessment & Plan Note (Signed)
Ongoing bilateral knee pain due to OA. She follows with pain clinic and is on oxycodone 15 mg q6h PRN but only takes it twice a day most days. Exam today show minimal tenderness of bilateral knee and able to perform some ROM. No erythema, increase warmth or worsening swelling. She is not a candidate for surgical intervention. Was seeing sports medicine a few years ago, patient at this time not interested in following with sports medicine. Discussed following up with pain clinic to discuss optimizing pain management for her.

## 2022-08-15 NOTE — Progress Notes (Addendum)
CC: bilateral leg swelling and knee pain  HPI:  Sharon Vasquez is a 72 y.o. female living with a history stated below and presents today for bilateral leg swelling and knee pain. Please see problem based assessment and plan for additional details.  Past Medical History:  Diagnosis Date   A-fib (HCC)    Acute hypoxemic respiratory failure (HCC) 02/20/2022   Acute on chronic hypoxic respiratory failure (HCC) 01/04/2022   Acute upper respiratory infection 01/08/2022   Anxiety    Arthritis    "qwhre; joints, back" (04/17/2017)   Atrial fibrillation (HCC)    Benign breast cyst in female, left 01/08/2017   Found by Screening mammogram, evaluated by U/S on 01/08/17 and determined to be a benign simple breast cyst.   Breast cancer (HCC)    Cellulitis of left lower leg 05/30/2017   CHF (congestive heart failure) (HCC)    Chronic low back pain 08/21/2016   Chronic lower back pain    Chronic venous insufficiency    /notes 05/30/2017   COPD (chronic obstructive pulmonary disease) (HCC)    Depression    Diabetes (HCC) 01/08/2022   Diabetes mellitus without complication (HCC)    DVT (deep venous thrombosis) (HCC) 11/16/2016   Dysrhythmia    Esophageal dysmotility 11/10/2019   Previous workup by ENT with fiberoptic leryngoscopy which was normal. Dx with laryngeal pharyngeal reflux.   GERD (gastroesophageal reflux disease)    Headache    "weekly for the last 3 months" (04/17/2017)   History of pulmonary embolism    Pulmonary embolism   Hyperlipidemia    Hypertension    Laryngopharyngeal reflux 12/18/2015   Morbid obesity (HCC)    PE (pulmonary embolism)    Pulmonary embolism (HCC) 09/21/2014   Sleep apnea    Stroke (HCC) 12/02/2021    Current Outpatient Medications on File Prior to Visit  Medication Sig Dispense Refill   albuterol (VENTOLIN HFA) 108 (90 Base) MCG/ACT inhaler Inhale 2 puffs into the lungs every 4 (four) hours as needed for shortness of breath. 18 g 1   aspirin EC  81 MG tablet Take 81 mg by mouth daily.     atorvastatin (LIPITOR) 80 MG tablet TAKE 1 TABLET BY MOUTH EVERY DAY (Patient taking differently: Take 80 mg by mouth daily.) 90 tablet 3   esomeprazole (NEXIUM) 40 MG capsule Take 1 capsule (40 mg total) by mouth daily as needed (for heartburn or indigestion). (Patient taking differently: Take 40 mg by mouth daily.) 90 capsule 1   Fluticasone-Umeclidin-Vilant (TRELEGY ELLIPTA) 100-62.5-25 MCG/ACT AEPB Inhale 1 puff into the lungs daily. 60 each 5   gabapentin (NEURONTIN) 400 MG capsule TAKE 1 CAPSULE BY MOUTH TWICE  DAILY 200 capsule 2   ibuprofen (ADVIL) 200 MG tablet Take 800 mg by mouth daily as needed for moderate pain.     ipratropium-albuterol (DUONEB) 0.5-2.5 (3) MG/3ML SOLN Take 3 mLs by nebulization every 6 (six) hours as needed. (Patient taking differently: Take 3 mLs by nebulization every 6 (six) hours as needed (shortness of breath).) 360 mL 3   letrozole (FEMARA) 2.5 MG tablet TAKE 1 TABLET(2.5 MG) BY MOUTH DAILY 90 tablet 3   losartan (COZAAR) 25 MG tablet Take 1 tablet (25 mg total) by mouth daily. 30 tablet 11   metoprolol succinate (TOPROL-XL) 25 MG 24 hr tablet TAKE 1 TABLET BY MOUTH DAILY 100 tablet 2   MYRBETRIQ 50 MG TB24 tablet TAKE 1 TABLET BY MOUTH DAILY 100 tablet 2   naloxone (NARCAN)  nasal spray 4 mg/0.1 mL Place 1 spray into the nose once as needed (for overdose suspected). for opioid overdose     oxyCODONE (ROXICODONE) 15 MG immediate release tablet Take 15 mg by mouth every 6 (six) hours as needed for pain.     OXYGEN Inhale 2-3 L into the lungs daily.     potassium chloride SA (KLOR-CON M) 20 MEQ tablet TAKE 1 TABLET BY MOUTH DAILY 90 tablet 0   PRESCRIPTION MEDICATION Inhale into the lungs at bedtime. cpap     rivaroxaban (XARELTO) 20 MG TABS tablet Take 1 tablet (20 mg total) by mouth every morning. 90 tablet 3   Semaglutide,0.25 or 0.5MG /DOS, 2 MG/3ML SOPN Inject 0.5 Units into the skin once a week. 3 mL 2   torsemide  (DEMADEX) 20 MG tablet Take 2 tablets by mouth daily, you may take an extra 1/2 tablet only as needed for weight gain of 2 lbs overnight or 5 lbs in a week (Patient taking differently: Take 20 mg by mouth daily as needed (for legs fluid).) 225 tablet 1   No current facility-administered medications on file prior to visit.   Review of Systems: ROS negative except for what is noted on the assessment and plan.  Vitals:   08/15/22 1534 08/15/22 1537  BP: (!) 175/78 (!) 159/76  Pulse: (!) 51 70  Temp: 98 F (36.7 C)   TempSrc: Oral   SpO2: 93%   Height: 6\' 2"  (1.88 m)    Physical Exam: Constitutional: chronically ill appearing female in motorized chair, in no acute distress HENT: normocephalic atraumatic Cardiovascular: regular rate and rhythm Pulmonary/Chest: normal work of breathing with [place], lungs clear to auscultation bilaterally, no wheezes or crackles MSK: bilateral non pitting LE edema, chronic venous dermatitis bilaterally, no open wounds, bleeding or drainage Neurological: alert & oriented x 3 Skin: warm and dry Psych: frustrated mood   Assessment & Plan:   Chronic venous insufficiency Patient presents with history of chronic venous insufficiency.  States bilateral LE swelling has been unchanged and not significantly improved. Denies any chest pain or worsening of shortness of breath. She prefers not using compression stockings due to difficulty using them.  Last visit provided information regarding juxta lite which she has not followed up on. Exam showed bilateral non-pitting LE edema with chronic skin changes. No open wounds at this time. Got information that [facility] would be able to fit and provide the juxta lite compression.   Plan -provided information for [facility] to obtain juxta lite compression  Primary osteoarthritis of both knees Ongoing bilateral knee pain due to OA. She follows with pain clinic and is on oxycodone 15 mg q6h PRN but only takes  it twice a day most days. Exam today show minimal tenderness of bilateral knee and able to perform some ROM. No erythema, increase warmth or worsening swelling. She is not a candidate for surgical intervention. Was seeing sports medicine a few years ago, patient at this time not interested in following with sports medicine. Discussed following up with pain clinic to discuss optimizing pain management for her.   Benign essential HTN Repeat BP improved 159/76 from 175/78. She is on losartan 25 mg, metoprolol 25 mg daily and torsemide 20 mg. She is uncertain if she has taken her antihypertensives today. Unclear of daily adherence to the medications.   Plan -reassess BP at next visit and decide if need for titrate up her antihypertensives  -BMP today   Patient discussed with  Dr. Layne Benton, D.O. [facility], PGY-1 Phone: [phone] Date 08/15/2022 Time 5:35 PM

## 2022-08-15 NOTE — Patient Instructions (Addendum)
Thank you, Sharon Vasquez for allowing [place] to provide your care today. Today we discussed your leg swelling and chronic knee pain.    -Blood work today to check electrolytes and kidney function -Continue working/following with your pain clinic -You can visit [facility] to obtain the Juxta lite compression to help with your leg swelling [facility] [address], [place], [place] [address] [phone]  I have ordered the following labs for you:  Lab Orders         BMP8+Anion Gap       Follow up: 2-3 months   Should you have any questions or concerns please call the internal medicine clinic at [phone].    Rana Snare, D.O. [facility]

## 2022-08-15 NOTE — Assessment & Plan Note (Addendum)
Repeat BP improved 159/76 from 175/78. She is on losartan 25 mg, metoprolol 25 mg daily and torsemide 20 mg. She is uncertain if she has taken her antihypertensives today. Unclear of daily adherence to the medications.   Plan -reassess BP at next visit and decide if need for titrate up her antihypertensives  -BMP today

## 2022-08-16 LAB — BMP8+ANION GAP
Anion Gap: 13 mmol/L (ref 10.0–18.0)
BUN/Creatinine Ratio: 12 (ref 12–28)
BUN: 10 mg/dL (ref 8–27)
CO2: 23 mmol/L (ref 20–29)
Calcium: 9.5 mg/dL (ref 8.7–10.3)
Chloride: 105 mmol/L (ref 96–106)
Creatinine, Ser: 0.83 mg/dL (ref 0.57–1.00)
Glucose: 93 mg/dL (ref 70–99)
Potassium: 4.2 mmol/L (ref 3.5–5.2)
Sodium: 141 mmol/L (ref 134–144)
eGFR: 75 mL/min/{1.73_m2} (ref >59)

## 2022-08-16 NOTE — Progress Notes (Signed)
Internal Medicine Clinic Attending  Case discussed with Dr. Zheng  At the time of the visit.  We reviewed the resident's history and exam and pertinent patient test results.  I agree with the assessment, diagnosis, and plan of care documented in the resident's note.  

## 2022-08-18 DIAGNOSIS — J449 Chronic obstructive pulmonary disease, unspecified: Secondary | ICD-10-CM | POA: Diagnosis not present

## 2022-08-24 DIAGNOSIS — G8929 Other chronic pain: Secondary | ICD-10-CM | POA: Diagnosis not present

## 2022-08-24 DIAGNOSIS — E11618 Type 2 diabetes mellitus with other diabetic arthropathy: Secondary | ICD-10-CM | POA: Diagnosis not present

## 2022-08-24 DIAGNOSIS — I10 Essential (primary) hypertension: Secondary | ICD-10-CM | POA: Diagnosis not present

## 2022-08-24 DIAGNOSIS — I509 Heart failure, unspecified: Secondary | ICD-10-CM | POA: Diagnosis not present

## 2022-08-24 DIAGNOSIS — Z79899 Other long term (current) drug therapy: Secondary | ICD-10-CM | POA: Diagnosis not present

## 2022-08-24 DIAGNOSIS — R030 Elevated blood-pressure reading, without diagnosis of hypertension: Secondary | ICD-10-CM | POA: Diagnosis not present

## 2022-08-24 DIAGNOSIS — I4891 Unspecified atrial fibrillation: Secondary | ICD-10-CM | POA: Diagnosis not present

## 2022-08-24 DIAGNOSIS — J439 Emphysema, unspecified: Secondary | ICD-10-CM | POA: Diagnosis not present

## 2022-08-24 DIAGNOSIS — J449 Chronic obstructive pulmonary disease, unspecified: Secondary | ICD-10-CM | POA: Diagnosis not present

## 2022-08-24 DIAGNOSIS — M25562 Pain in left knee: Secondary | ICD-10-CM | POA: Diagnosis not present

## 2022-08-24 DIAGNOSIS — M25561 Pain in right knee: Secondary | ICD-10-CM | POA: Diagnosis not present

## 2022-08-29 DIAGNOSIS — Z79899 Other long term (current) drug therapy: Secondary | ICD-10-CM | POA: Diagnosis not present

## 2022-09-15 ENCOUNTER — Telehealth: Payer: Self-pay | Admitting: Pulmonary Disease

## 2022-09-15 NOTE — Telephone Encounter (Signed)
PT has a Cpap machine that does not work and she was told by [employer] she is not eligable for a new one until Jan 2025. Please call PT to advise next step Her # is [phone]  She also mentioned issues with her electric bed not working either. It does not elevate her head.

## 2022-09-18 DIAGNOSIS — J449 Chronic obstructive pulmonary disease, unspecified: Secondary | ICD-10-CM | POA: Diagnosis not present

## 2022-09-20 NOTE — Telephone Encounter (Signed)
RA, please advise.  

## 2022-09-22 ENCOUNTER — Telehealth: Payer: Self-pay

## 2022-09-22 NOTE — Telephone Encounter (Signed)
Patient checking on message for CPAP machine. Patient phone number is [phone].

## 2022-09-22 NOTE — Patient Outreach (Signed)
  Care Coordination   Follow Up Visit Note   09/22/2022 Name: Sharon Vasquez [name]: [id number] DOB: Jan 16, 1951  Sharon Vasquez is a 72 y.o. year old female who sees Atway, Rayann N, DO for primary care. I spoke with  [name] by phone today.  What matters to the patients health and wellness today?  Getting a new hospital bed and CPAP    Goals Addressed               This Visit's Progress     My hospital bed motor is burnt out and my CPAP is not working (pt-stated)        Care Coordination Interventions: Call placed to Ariel at [employer] at [phone] who shared patient needs an updated sleep study, face to face appointment and new prescription from her pulmonologist for a new cpap.  Patient would also need a new office visit and prescription for a new hospital bed. Advised patient to make appointment at [facility] for the hospital bed visit and new prescription.  She will get appt with her pulmonologist.          SDOH assessments and interventions completed:  Yes  SDOH Interventions Today    Flowsheet Row Most Recent Value  SDOH Interventions   Food Insecurity Interventions Intervention Not Indicated  Housing Interventions Intervention Not Indicated  Transportation Interventions Intervention Not Indicated        Care Coordination Interventions:  Yes, provided   Follow up plan:  Patient to schedule appointments with [facility] and Pulmonologist.    Encounter Outcome:  Pt. Visit Completed

## 2022-09-25 DIAGNOSIS — C50911 Malignant neoplasm of unspecified site of right female breast: Secondary | ICD-10-CM | POA: Diagnosis not present

## 2022-09-25 DIAGNOSIS — I10 Essential (primary) hypertension: Secondary | ICD-10-CM | POA: Diagnosis not present

## 2022-09-25 DIAGNOSIS — Z79899 Other long term (current) drug therapy: Secondary | ICD-10-CM | POA: Diagnosis not present

## 2022-09-25 DIAGNOSIS — J439 Emphysema, unspecified: Secondary | ICD-10-CM | POA: Diagnosis not present

## 2022-09-25 DIAGNOSIS — M25562 Pain in left knee: Secondary | ICD-10-CM | POA: Diagnosis not present

## 2022-09-25 DIAGNOSIS — R030 Elevated blood-pressure reading, without diagnosis of hypertension: Secondary | ICD-10-CM | POA: Diagnosis not present

## 2022-09-25 DIAGNOSIS — I4891 Unspecified atrial fibrillation: Secondary | ICD-10-CM | POA: Diagnosis not present

## 2022-09-25 DIAGNOSIS — I509 Heart failure, unspecified: Secondary | ICD-10-CM | POA: Diagnosis not present

## 2022-09-25 DIAGNOSIS — M25561 Pain in right knee: Secondary | ICD-10-CM | POA: Diagnosis not present

## 2022-09-25 DIAGNOSIS — J449 Chronic obstructive pulmonary disease, unspecified: Secondary | ICD-10-CM | POA: Diagnosis not present

## 2022-09-25 DIAGNOSIS — G8929 Other chronic pain: Secondary | ICD-10-CM | POA: Diagnosis not present

## 2022-09-27 DIAGNOSIS — Z79899 Other long term (current) drug therapy: Secondary | ICD-10-CM | POA: Diagnosis not present

## 2022-10-02 ENCOUNTER — Telehealth: Payer: Self-pay | Admitting: *Deleted

## 2022-10-02 NOTE — Telephone Encounter (Signed)
Patient called in stating both legs are "weeping" > 1 week R > L. States she cannot take torsemide 40 mg daily as she is very active [employer], church) and she will "pee all over myself." Also, notes swelling in abdomen. States, "my stomach is tight." Last weight at pain clinic was 349 lbs which is down from 365 lbs at [facility] on 06/01/22. She notes more SHOB with activity even with O2 at 2.5 L as well as very low energy. First available appt is 7/9 at 1415. She is advised to keep that appt but head to ED today. States she will go to ED now.

## 2022-10-05 ENCOUNTER — Other Ambulatory Visit: Payer: Self-pay

## 2022-10-05 ENCOUNTER — Emergency Department (HOSPITAL_COMMUNITY): Payer: Medicare HMO

## 2022-10-05 ENCOUNTER — Emergency Department (HOSPITAL_COMMUNITY)
Admission: EM | Admit: 2022-10-05 | Discharge: 2022-10-05 | Disposition: A | Discharge status: Home / Self Care | Payer: Medicare HMO | Attending: Emergency Medicine | Admitting: Emergency Medicine

## 2022-10-05 ENCOUNTER — Encounter (HOSPITAL_COMMUNITY): Payer: Self-pay

## 2022-10-05 DIAGNOSIS — R0602 Shortness of breath: Secondary | ICD-10-CM | POA: Diagnosis not present

## 2022-10-05 DIAGNOSIS — Z7982 Long term (current) use of aspirin: Secondary | ICD-10-CM | POA: Diagnosis not present

## 2022-10-05 DIAGNOSIS — E119 Type 2 diabetes mellitus without complications: Secondary | ICD-10-CM | POA: Diagnosis not present

## 2022-10-05 DIAGNOSIS — G44209 Tension-type headache, unspecified, not intractable: Secondary | ICD-10-CM | POA: Diagnosis not present

## 2022-10-05 DIAGNOSIS — G4489 Other headache syndrome: Secondary | ICD-10-CM | POA: Diagnosis not present

## 2022-10-05 DIAGNOSIS — I110 Hypertensive heart disease with heart failure: Secondary | ICD-10-CM | POA: Insufficient documentation

## 2022-10-05 DIAGNOSIS — Z8673 Personal history of transient ischemic attack (TIA), and cerebral infarction without residual deficits: Secondary | ICD-10-CM | POA: Diagnosis not present

## 2022-10-05 DIAGNOSIS — R6889 Other general symptoms and signs: Secondary | ICD-10-CM | POA: Diagnosis not present

## 2022-10-05 DIAGNOSIS — I5030 Unspecified diastolic (congestive) heart failure: Secondary | ICD-10-CM | POA: Diagnosis not present

## 2022-10-05 DIAGNOSIS — J449 Chronic obstructive pulmonary disease, unspecified: Secondary | ICD-10-CM | POA: Diagnosis not present

## 2022-10-05 DIAGNOSIS — R609 Edema, unspecified: Secondary | ICD-10-CM | POA: Diagnosis not present

## 2022-10-05 DIAGNOSIS — I10 Essential (primary) hypertension: Secondary | ICD-10-CM

## 2022-10-05 DIAGNOSIS — Z7902 Long term (current) use of antithrombotics/antiplatelets: Secondary | ICD-10-CM | POA: Diagnosis not present

## 2022-10-05 DIAGNOSIS — Z79899 Other long term (current) drug therapy: Secondary | ICD-10-CM | POA: Insufficient documentation

## 2022-10-05 DIAGNOSIS — I517 Cardiomegaly: Secondary | ICD-10-CM | POA: Diagnosis not present

## 2022-10-05 LAB — COMPREHENSIVE METABOLIC PANEL
ALT: 18 U/L (ref 0–44)
AST: 17 U/L (ref 15–41)
Albumin: 3.4 g/dL — ABNORMAL LOW (ref 3.5–5.0)
Alkaline Phosphatase: 90 U/L (ref 38–126)
Anion gap: 6 (ref 5–15)
BUN: 13 mg/dL (ref 8–23)
CO2: 27 mmol/L (ref 22–32)
Calcium: 8.9 mg/dL (ref 8.9–10.3)
Chloride: 107 mmol/L (ref 98–111)
Creatinine, Ser: 0.88 mg/dL (ref 0.44–1.00)
GFR, Estimated: >60 mL/min (ref >60)
Glucose, Bld: 99 mg/dL (ref 70–99)
Potassium: 3.5 mmol/L (ref 3.5–5.1)
Sodium: 140 mmol/L (ref 135–145)
Total Bilirubin: 0.6 mg/dL (ref 0.3–1.2)
Total Protein: 6.7 g/dL (ref 6.5–8.1)

## 2022-10-05 LAB — CBC WITH DIFFERENTIAL/PLATELET
Abs Immature Granulocytes: 0.02 10*3/uL (ref 0.00–0.07)
Basophils Absolute: 0 10*3/uL (ref 0.0–0.1)
Basophils Relative: 0 %
Eosinophils Absolute: 0.1 10*3/uL (ref 0.0–0.5)
Eosinophils Relative: 2 %
HCT: 35.5 % — ABNORMAL LOW (ref 36.0–46.0)
Hemoglobin: 11.8 g/dL — ABNORMAL LOW (ref 12.0–15.0)
Immature Granulocytes: 0 %
Lymphocytes Relative: 36 %
Lymphs Abs: 2.2 10*3/uL (ref 0.7–4.0)
MCH: 28 pg (ref 26.0–34.0)
MCHC: 33.2 g/dL (ref 30.0–36.0)
MCV: 84.3 fL (ref 80.0–100.0)
Monocytes Absolute: 0.4 10*3/uL (ref 0.1–1.0)
Monocytes Relative: 7 %
Neutro Abs: 3.4 10*3/uL (ref 1.7–7.7)
Neutrophils Relative %: 55 %
Platelets: 434 10*3/uL — ABNORMAL HIGH (ref 150–400)
RBC: 4.21 MIL/uL (ref 3.87–5.11)
RDW: 13.9 % (ref 11.5–15.5)
WBC: 6.1 10*3/uL (ref 4.0–10.5)
nRBC: 0 % (ref 0.0–0.2)

## 2022-10-05 LAB — BRAIN NATRIURETIC PEPTIDE: B Natriuretic Peptide: 188.2 pg/mL — ABNORMAL HIGH (ref 0.0–100.0)

## 2022-10-05 MED ORDER — ONDANSETRON HCL 4 MG/2ML IJ SOLN
4.0000 mg | Freq: Once | INTRAMUSCULAR | Status: AC
  Administered 2022-10-05: 4 mg via INTRAVENOUS
  Filled 2022-10-05: qty 2

## 2022-10-05 MED ORDER — DIPHENHYDRAMINE HCL 50 MG/ML IJ SOLN
12.5000 mg | Freq: Once | INTRAMUSCULAR | Status: AC
  Administered 2022-10-05: 12.5 mg via INTRAVENOUS
  Filled 2022-10-05: qty 1

## 2022-10-05 MED ORDER — FUROSEMIDE 10 MG/ML IJ SOLN
40.0000 mg | Freq: Once | INTRAMUSCULAR | Status: AC
  Administered 2022-10-05: 40 mg via INTRAVENOUS
  Filled 2022-10-05: qty 4

## 2022-10-05 MED ORDER — ACETAMINOPHEN 500 MG PO TABS
1000.0000 mg | ORAL_TABLET | Freq: Once | ORAL | Status: AC
  Administered 2022-10-05: 1000 mg via ORAL
  Filled 2022-10-05: qty 2

## 2022-10-05 NOTE — ED Triage Notes (Signed)
Patient bib [facility] from home with complaints of leg swelling and hypertension. EMS reports weeping and busted blisters bilaterally on the back of legs. EMS reports initial blood pressure was 209/128, patient is short of breath, and has a headache. Patient states legs have been swollen for the past month. Patient called PCP which told her to come get evaluated at ED. Baseline patient is on 2L of oxygen at home, ems placed her on 4l for comfort. Patient has a history of A-fib and takes Xarelto. She is Alert and oriented and speaking in full sentences on arrival to ED.

## 2022-10-05 NOTE — Discharge Instructions (Addendum)
Please continue to take your blood pressure medications as prescribed by your internal medicine team. This will help with your shortness of breath and blood pressures.  Take 1 tablet of losartan once daily Take 1 tablet of metoprolol once daily Take 2 tablets of torsemide once daily  Attend your internal medicine appointment 10/10/22 with Dr. Benito Mccreedy   Return to the ER if you have some shortness of breath, chest pain, headache that will not go away.

## 2022-10-05 NOTE — ED Notes (Signed)
RN attempted IV twice without success.

## 2022-10-05 NOTE — ED Provider Notes (Signed)
[place] EMERGENCY DEPARTMENT AT [facility] Provider Note   CSN: [id number] Arrival date & time: 10/05/22  1740     History  Chief Complaint  Patient presents with   Hypertension   Congestive Heart Failure    Sharon Vasquez is a 72 y.o. female with history of HTN on medication therapy, diabetes, COPD, CHF on lasix, venous insufficiency, HFpEF, Afib on xarelto, stroke, Presents today due to concern for elevated blood pressure, increased weeping and swelling in her legs bilaterally, and some shortness of breath.  She reports she took her blood pressure  medication this morning, but her blood pressures remained elevated.  She also endorses increased shortness of breath.  She is normally on 2 L nasal cannula at home.  Reports weeping from her legs that will make her socks wet.  She also notes a headache that is like a tension in the front of her forehead.  Denies any light or sound sensitivity, falls or trauma to her head, changes in vision, dizziness, vomiting.  Denies any chest pain, abdominal pain, jaw pain.  Denies any fever or chills.   HPI     Home Medications Prior to Admission medications   Medication Sig Start Date End Date Taking? Authorizing Provider  albuterol (VENTOLIN HFA) 108 (90 Base) MCG/ACT inhaler Inhale 2 puffs into the lungs every 4 (four) hours as needed for shortness of breath. 07/10/22   Atway, Rayann N, DO  aspirin EC 81 MG tablet Take 81 mg by mouth daily. 12/15/20   [provider]  atorvastatin (LIPITOR) 80 MG tablet TAKE 1 TABLET BY MOUTH EVERY DAY Patient taking differently: Take 80 mg by mouth daily. 12/02/21   Atway, Rayann N, DO  esomeprazole (NEXIUM) 40 MG capsule Take 1 capsule (40 mg total) by mouth daily as needed (for heartburn or indigestion). Patient taking differently: Take 40 mg by mouth daily. 01/20/19   Synetta Fail, MD  Fluticasone-Umeclidin-Vilant (TRELEGY ELLIPTA) 100-62.5-25 MCG/ACT AEPB Inhale 1 puff into the lungs  daily. 05/17/22   Nooruddin, Jason Fila, MD  gabapentin (NEURONTIN) 400 MG capsule TAKE 1 CAPSULE BY MOUTH TWICE  DAILY 07/05/22   Atway, Rayann N, DO  ibuprofen (ADVIL) 200 MG tablet Take 800 mg by mouth daily as needed for moderate pain.    [provider]  ipratropium-albuterol (DUONEB) 0.5-2.5 (3) MG/3ML SOLN Take 3 mLs by nebulization every 6 (six) hours as needed. Patient taking differently: Take 3 mLs by nebulization every 6 (six) hours as needed (shortness of breath). 06/18/20   Glenford Bayley, NP  letrozole [facility]) 2.5 MG tablet TAKE 1 TABLET(2.5 MG) BY MOUTH DAILY 06/14/22   Serena Croissant, MD  losartan (COZAAR) 25 MG tablet Take 1 tablet (25 mg total) by mouth daily. 02/22/22 02/22/23  Modena Slater, DO  metoprolol succinate (TOPROL-XL) 25 MG 24 hr tablet TAKE 1 TABLET BY MOUTH DAILY 02/09/22   Merrilyn Puma, MD  MYRBETRIQ 50 MG TB24 tablet TAKE 1 TABLET BY MOUTH DAILY 01/25/22   Doran Stabler, DO  naloxone [facility]) nasal spray 4 mg/0.1 mL Place 1 spray into the nose once as needed (for overdose suspected). for opioid overdose 11/02/21   [provider]  oxyCODONE (ROXICODONE) 15 MG immediate release tablet Take 15 mg by mouth every 6 (six) hours as needed for pain. 08/05/20   [provider]  OXYGEN Inhale 2-3 L into the lungs daily.    [provider]  potassium chloride SA (KLOR-CON M) 20 MEQ tablet TAKE 1  TABLET BY MOUTH DAILY 05/11/22   Atway, Rayann N, DO  PRESCRIPTION MEDICATION Inhale into the lungs at bedtime. cpap    [provider]  rivaroxaban (XARELTO) 20 MG TABS tablet Take 1 tablet (20 mg total) by mouth every morning. 02/13/22   Merrilyn Puma, MD  Semaglutide,0.25 or 0.5MG /DOS, 2 MG/3ML SOPN Inject 0.5 Units into the skin once a week. 05/17/22   Nooruddin, Jason Fila, MD  torsemide (DEMADEX) 20 MG tablet Take 2 tablets by mouth daily, you may take an extra 1/2 tablet only as needed for weight gain of 2 lbs overnight or 5 lbs in a week Patient taking  differently: Take 20 mg by mouth daily as needed (for legs fluid). 12/02/21   Masters, Katie, DO      Allergies    Patient has no known allergies.    Review of Systems   Review of Systems  HENT:         Headache  Respiratory:  Positive for shortness of breath.   Skin:        Weeping    Physical Exam Updated Vital Signs BP 136/80 (BP Location: Right Arm)   Pulse 64   Temp 98.1 F (36.7 C) (Oral)   Resp (!) 21   Ht 6\' 2"  (1.88 m)   Wt (!) 163.3 kg   SpO2 100%   BMI 46.22 kg/m  Physical Exam Vitals and nursing note reviewed.  Constitutional:      General: She is not in acute distress.    Appearance: Normal appearance.  HENT:     Head: Normocephalic and atraumatic.  Eyes:     Extraocular Movements: Extraocular movements intact.     Pupils: Pupils are equal, round, and reactive to light.  Cardiovascular:     Rate and Rhythm: Normal rate and regular rhythm.     Heart sounds: Normal heart sounds. No murmur heard. Pulmonary:     Effort: No respiratory distress.     Comments: Diffuse wheezing Abdominal:     General: Abdomen is flat. Bowel sounds are normal.     Palpations: Abdomen is soft.  Musculoskeletal:        General: Normal range of motion.     Cervical back: Normal range of motion.     Comments: Obese right and left lower extremities, no pitting edema. Area of ulceration and weeping noted on the right medial side of the calf  Neurological:     General: No focal deficit present.     Mental Status: She is alert.     Comments: Cranial nerves III through XII intact Normal sensation and strength throughout the upper and lower extremities bilaterally  Psychiatric:        Mood and Affect: Mood normal.     ED Results / Procedures / Treatments   Labs (all labs ordered are listed, but only abnormal results are displayed) Labs Reviewed  BRAIN NATRIURETIC PEPTIDE - Abnormal; Notable for the following components:      Result Value   B Natriuretic Peptide 188.2 (*)     All other components within normal limits  CBC WITH DIFFERENTIAL/PLATELET - Abnormal; Notable for the following components:   Hemoglobin 11.8 (*)    HCT 35.5 (*)    Platelets 434 (*)    All other components within normal limits  COMPREHENSIVE METABOLIC PANEL - Abnormal; Notable for the following components:   Albumin 3.4 (*)    All other components within normal limits    EKG EKG Interpretation Date/Time:  Thursday October 05 2022 17:53:22 EDT Ventricular Rate:  84 PR Interval:    QRS Duration:  110 QT Interval:  427 QTC Calculation: 505 R Axis:   -39  Text Interpretation: Atrial fibrillation Left axis deviation Anterior infarct, old Borderline T abnormalities, inferior leads Prolonged QT interval Baseline wander in lead(s) V6 No significant change since last tracing Confirmed by Elayne Snare ([id number]) on 10/05/2022 5:59:03 PM  Radiology DG Chest Portable 1 View  Result Date: 10/05/2022 CLINICAL DATA:  Shortness of breath EXAM: PORTABLE CHEST 1 VIEW COMPARISON:  Chest x-ray April 26, 2022 FINDINGS: The cardiomediastinal silhouette is unchanged in contour. Diffuse but basilar predominant heterogeneous pulmonary opacities. Probable small bilateral pleural effusions. No pneumothorax. The visualized upper abdomen is unremarkable. No acute osseous abnormality. IMPRESSION: 1. Basilar predominant heterogeneous pulmonary opacities, favored to represent moderate pulmonary edema. 2. Unchanged cardiomegaly. Electronically Signed   By: Jacob Moores M.D.   On: 10/05/2022 19:05    Procedures Procedures    Medications Ordered in ED Medications  ondansetron (ZOFRAN) injection 4 mg (4 mg Intravenous Given 10/05/22 1916)  diphenhydrAMINE (BENADRYL) injection 12.5 mg (12.5 mg Intravenous Given 10/05/22 1916)  acetaminophen (TYLENOL) tablet 1,000 mg (1,000 mg Oral Given 10/05/22 1914)  furosemide (LASIX) injection 40 mg (40 mg Intravenous Given 10/05/22 2028)    ED Course/ Medical Decision Making/  A&P Clinical Course as of 10/05/22 2153  Thu Oct 05, 2022  1908 SpO2: 98 % [AF]  1908 SpO2: 100 % [AF]    Clinical Course User Index [AF] Arabella Merles, PA-C                             Medical Decision Making Amount and/or Complexity of Data Reviewed Labs: ordered. Radiology: ordered.   72 y.o. female with pertinent past medical history of HTN on medication therapy, diabetes presents to the ED for concern of elevated blood pressure, shortness of breath, headache, leg weeping  Differential diagnosis includes but is not limited to hypertensive urgency, hypertensive emergency, CHF exacerbation, medication noncompliance, pneumonia, COPD exacerbation  ED Course:  Patient reports elevated blood pressure this morning, upon arrival to the ED she is at around the 160s over 100s.  She reports she took metoprolol this morning, did not mention any other medications.  Per internal medicine visit from 5/24, she supposed to be taking losartan 25 mg, metoprolol 25 mg daily and torsemide 20, but it is unclear if she is compliant with medications.  She appears in no acute distress, talking in full sentences, no neurological deficits.  This headache is consistent with previous headaches.  Respiratory rate slightly increased, 100% oxygen saturation with current 4 L nasal cannula.  Suspect her increased leg weeping and shortness of breath may be due to fluid overload from CHF.  There has been concern for noncompliance to her torsemide.  No fevers, chills that would be concerning for pneumonia.  Will further evaluate with chest x-ray and BNP. A fib noted on EKG and in room, could be contributing to SOB She has had elevated blood pressures here today, but systolic has not reached over 161. Will check CMP for possible AKI, no other signs of end organ damage at this time. Will check CXR for pulmonary edema.  This may be due to medication noncompliance as she only reports taking metoprolol this morning but  should be on losartan and torsemide as well. Will treat patient headache with tylenol, zofran, and benadryl and see if  she has improvement in symptoms. Due to possible pulmonary edema on chest x-ray, elevated blood pressures here today, increasing weeping, suspect she is volume overloaded and will treat with Lasix. Patient reports headache has completely resolved.  She is now at baseline 2 L nasal cannula and at 100% O2. Since headache improved with some headache medications, suspect simple tension headache. She does not have an increased oxygen requirement. Patient reports she does not take her torsemide daily as prescribed suspect this is the cause of her increased blood pressures today. With dose of lasix now at 136/80. Will discharge with instructions to continue taking medications as prescribed   Impression: Hypertension Volume overload secondary to CHF and medication noncompliance  Tension headache   Disposition:  The patient was discharged home with instructions to continue taking her torsemide, metoprolol, losartan as prescribed by her internal medicine team.  She has an appointment with her internal medicine team 7/9.  Advised her that she needs to attend this appointment. Return precautions given.  Lab Tests: I Ordered, and personally interpreted labs.  The pertinent results include:   CBC, CMP with no concerning findings BNP 188.2, below her baseline  Imaging Studies ordered: I ordered imaging studies including CXR  I independently visualized the imaging with scope of interpretation limited to determining acute life threatening conditions related to emergency care. Imaging showed basilar pulmonary opacities, concern for pulmonary edema I agree with the radiologist interpretation   Cardiac Monitoring: / EKG: The patient was maintained on a cardiac monitor.  I personally viewed and interpreted the EKG which showed an underlying rhythm of: Atrial fibrillation  Monitor in room  shows NSR   Consultations Obtained: None    Co morbidities that complicate the patient evaluation  HTN on medication therapy, diabetes, COPD, CHF on lasix, venous insufficiency, HFpEF, Afib on xarelto, stroke  Social Determinants of Health:  Physical inactivity, financial resource strain              Final Clinical Impression(s) / ED Diagnoses Final diagnoses:  Primary hypertension  Acute non intractable tension-type headache    Rx / DC Orders ED Discharge Orders     None         Arabella Merles, PA-C 10/05/22 2153    Elayne Snare K, DO 10/05/22 2200

## 2022-10-07 ENCOUNTER — Other Ambulatory Visit: Payer: Self-pay | Admitting: Internal Medicine

## 2022-10-07 DIAGNOSIS — E782 Mixed hyperlipidemia: Secondary | ICD-10-CM

## 2022-10-09 NOTE — Telephone Encounter (Signed)
Please try PT again. See  Dr's notes.

## 2022-10-10 ENCOUNTER — Encounter: Payer: Self-pay | Admitting: Student

## 2022-10-10 ENCOUNTER — Ambulatory Visit (INDEPENDENT_AMBULATORY_CARE_PROVIDER_SITE_OTHER): Payer: Medicare HMO | Admitting: Student

## 2022-10-10 ENCOUNTER — Telehealth: Payer: Self-pay

## 2022-10-10 VITALS — BP 137/91 | HR 80 | Temp 97.9°F | Ht 74.0 in

## 2022-10-10 DIAGNOSIS — I5032 Chronic diastolic (congestive) heart failure: Secondary | ICD-10-CM | POA: Diagnosis not present

## 2022-10-10 DIAGNOSIS — I83029 Varicose veins of left lower extremity with ulcer of unspecified site: Secondary | ICD-10-CM | POA: Diagnosis not present

## 2022-10-10 DIAGNOSIS — Z87891 Personal history of nicotine dependence: Secondary | ICD-10-CM | POA: Diagnosis not present

## 2022-10-10 DIAGNOSIS — L97929 Non-pressure chronic ulcer of unspecified part of left lower leg with unspecified severity: Secondary | ICD-10-CM | POA: Diagnosis not present

## 2022-10-10 DIAGNOSIS — I10 Essential (primary) hypertension: Secondary | ICD-10-CM

## 2022-10-10 DIAGNOSIS — I110 Hypertensive heart disease with heart failure: Secondary | ICD-10-CM | POA: Diagnosis not present

## 2022-10-10 DIAGNOSIS — I872 Venous insufficiency (chronic) (peripheral): Secondary | ICD-10-CM | POA: Diagnosis not present

## 2022-10-10 DIAGNOSIS — J9611 Chronic respiratory failure with hypoxia: Secondary | ICD-10-CM

## 2022-10-10 DIAGNOSIS — Z7985 Long-term (current) use of injectable non-insulin antidiabetic drugs: Secondary | ICD-10-CM

## 2022-10-10 DIAGNOSIS — E1151 Type 2 diabetes mellitus with diabetic peripheral angiopathy without gangrene: Secondary | ICD-10-CM | POA: Diagnosis not present

## 2022-10-10 DIAGNOSIS — E119 Type 2 diabetes mellitus without complications: Secondary | ICD-10-CM

## 2022-10-10 MED ORDER — POTASSIUM CHLORIDE CRYS ER 20 MEQ PO TBCR: 20.0000 meq | EXTENDED_RELEASE_TABLET | Freq: Every day | ORAL | 0 refills | Status: DC

## 2022-10-10 MED ORDER — SEMAGLUTIDE (1 MG/DOSE) 4 MG/3ML ~~LOC~~ SOPN: 1.0000 mg | PEN_INJECTOR | SUBCUTANEOUS | 3 refills | Status: DC

## 2022-10-10 NOTE — Assessment & Plan Note (Signed)
137/91 today.  Much better controlled than at previous visits.  No changes to management today.

## 2022-10-10 NOTE — Assessment & Plan Note (Signed)
Chronic and stable, not overtly volume overloaded.  Pulse oximetry is good today, vital signs are normal.  No acute exacerbation.

## 2022-10-10 NOTE — Patient Instructions (Signed)
This after visit summary is an important review of tests, referrals, and medication changes that were discussed during your visit. If you have questions or concerns, call [phone]. Outside of clinic business hours, call the main hospital at [phone] and ask the operator for the on-call internal medicine resident.   Ernesta Amble MD 10/10/2022, 3:05 PM

## 2022-10-10 NOTE — Progress Notes (Signed)
Subjective:  Sharon Vasquez is a 72 y.o. who presents to clinic for the following:  Follow-up (SLEEP STUDY- NEEDING NEW ORDER / LEGS DRAINING  / MEDICATION REFILL / PAIN # 10 - LEGS)  Leg swelling is worse over last few months. Has episodes of weeping. Chronic ulcer on the left leg. Making it difficult to move around, get into bed.   Broken hospital bed. Needs referral for lift chair that reclines for leg elevation.   Quit smoking in October 2023, feeling better since then. Using oxygen as needed for shortness of breath with activity.  Lost some weight, down to around 360 lbs. Ozempic working well for her. Self-titrated to 0.5 mg recently, tolerating well without side-effects.  Objective:   Vitals:   10/10/22 1342  BP: (!) 137/91  Pulse: 80  Temp: 97.9 F (36.6 C)  TempSrc: Oral  SpO2: 92%  Height: 6\' 2"  (1.88 m)    Physical Exam No apparent distress or discomfort today Heart rate is normal, rhythm is regular, radial pulses are strong, brisk lower extremity capillary refill bilaterally, no appreciable murmurs Breathing is regular and unlabored on room air, scant expiratory wheeze in bilateral lung bases Skin is warm and dry Bilateral lower extremities with brawny edema and hyperpigmentation, ulcer on the left leg weeping serous fluid, ulcer on the right leg with serosanguineous drainage Alert and oriented Pleasant, concordant affect  Assessment & Plan:   Problem List Items Addressed This Visit     (HFpEF) heart failure with preserved ejection fraction (HCC) (Chronic)    Chronic and stable, not overtly volume overloaded.  Pulse oximetry is good today, vital signs are normal.  No acute exacerbation.      Relevant Medications   potassium chloride SA (KLOR-CON M) 20 MEQ tablet   Benign essential HTN (Chronic)    137/91 today.  Much better controlled than at previous visits.  No changes to management today.      Chronic respiratory failure with hypoxia (HCC)  (Chronic)    Doing better since she quit smoking.  Feels like she is using her oxygen less.  2 L/min for ADLs, as she puts it.  Previously she was using it all the time.  Sats are decent on room air today and she is breathing comfortably.      Chronic venous insufficiency (Chronic)    Chronic, not improving despite adherence to diuretic therapy. More weeping of late. There are bilateral venous stasis ulcers.  No no signs of infection.  Legs look well-perfused.  No asymmetry to suggest a new DVT in 1 leg or the other.  She did well with Unna boots in the past but stopped coming to clinic for replacements because of the burden frequent visits put on her.  Will restart Unna boots today, she is amenable to weekly visits for replacement for now as a temporizing measure.  Also put referral into wound clinic for her venous stasis ulcers.      Morbid obesity (HCC) (Chronic)    Doing well on Ozempic.  Weight is down 360 pounds from a peak of 400 several months ago.  Self titrated up to 0.5 mg weekly.  Tolerating this increased dose well.  Recommend going up to 1 mg weekly.  Also put DME referral in for a lift chair, she is having a lot of trouble getting on and off of her couch especially in light of her chronic severe venous stasis changes in both legs.      Relevant  Medications   Semaglutide, 1 MG/DOSE, 4 MG/3ML SOPN   Other Relevant Orders   Ambulatory Referral for DME   Venous stasis ulcer of left lower extremity (HCC) - Primary (Chronic)   Relevant Orders   AMB referral to wound care center   Other Visit Diagnoses     New onset type 2 diabetes mellitus (HCC)       Relevant Medications   Semaglutide, 1 MG/DOSE, 4 MG/3ML SOPN   Chronic heart failure with preserved ejection fraction (HFpEF) (HCC)       Relevant Medications   potassium chloride SA (KLOR-CON M) 20 MEQ tablet       Return in one week for Unna boot replacement.  Patient discussed with Dr. Jan Fireman MD 10/10/2022,  5:24 PM  [phone]

## 2022-10-10 NOTE — Telephone Encounter (Signed)
Transition Care Management Unsuccessful Follow-up Telephone Call  Date of discharge and from where:  [place] 7/4  Attempts:  1st Attempt  Reason for unsuccessful TCM follow-up call:  No answer/busy   Sharon Vasquez [facility] Guide, [place] [phone] [address], [place],  [address] Phone: [phone] Email: [email or web address]       

## 2022-10-10 NOTE — Assessment & Plan Note (Addendum)
Chronic, not improving despite adherence to diuretic therapy. More weeping of late. There are bilateral venous stasis ulcers.  No no signs of infection.  Legs look well-perfused.  No asymmetry to suggest a new DVT in 1 leg or the other.  She did well with Unna boots in the past but stopped coming to clinic for replacements because of the burden frequent visits put on her.  Will restart Unna boots today, she is amenable to weekly visits for replacement for now as a temporizing measure.  Also put referral into wound clinic for her venous stasis ulcers.

## 2022-10-10 NOTE — Assessment & Plan Note (Addendum)
Doing well on Ozempic.  Weight is down 360 pounds from a peak of 400 several months ago.  Self titrated up to 0.5 mg weekly.  Tolerating this increased dose well.  Recommend going up to 1 mg weekly.  Also put DME referral in for a lift chair, she is having a lot of trouble getting on and off of her couch especially in light of her chronic severe venous stasis changes in both legs.

## 2022-10-10 NOTE — Assessment & Plan Note (Signed)
Doing better since she quit smoking.  Feels like she is using her oxygen less.  2 L/min for ADLs, as she puts it.  Previously she was using it all the time.  Sats are decent on room air today and she is breathing comfortably.

## 2022-10-11 ENCOUNTER — Encounter: Payer: 59 | Admitting: Student

## 2022-10-11 ENCOUNTER — Telehealth: Payer: Self-pay

## 2022-10-11 NOTE — Telephone Encounter (Signed)
Transition Care Management Unsuccessful Follow-up Telephone Call  Date of discharge and from where:  [place] 7/3  Attempts:  2nd Attempt  Reason for unsuccessful TCM follow-up call:  No answer/busy   Durga Saldarriaga [facility] Guide, [place] [phone] [address], [place], [place] [address] Phone: [phone] Email: [email or web address]       

## 2022-10-12 ENCOUNTER — Telehealth: Payer: Self-pay | Admitting: Pulmonary Disease

## 2022-10-12 NOTE — Progress Notes (Signed)
Internal Medicine Clinic Attending  Case discussed with the resident at the time of the visit.  We reviewed the resident's history and exam and pertinent patient test results.  I agree with the assessment, diagnosis, and plan of care documented in the resident's note.  

## 2022-10-12 NOTE — Telephone Encounter (Signed)
ATC patient. LVMTCB. 

## 2022-10-12 NOTE — Telephone Encounter (Signed)
PT states she got a call from Dr. Reginia Naas nurse. Please try her again. [phone]

## 2022-10-12 NOTE — Addendum Note (Signed)
Addended by: Dickie La on: 10/12/2022 02:25 PM   Modules accepted: Level of Service

## 2022-10-13 NOTE — Telephone Encounter (Signed)
PT states she got a call from Dr. Alva's nurse. Please try her again. [phone] 

## 2022-10-13 NOTE — Telephone Encounter (Signed)
Left message x 2 for patient to call to give information regarding getting CPAP and electric bed per Dr. Vassie Loll.

## 2022-10-17 ENCOUNTER — Ambulatory Visit: Payer: Medicare HMO | Admitting: Student

## 2022-10-17 ENCOUNTER — Encounter: Payer: Self-pay | Admitting: Student

## 2022-10-17 DIAGNOSIS — I5032 Chronic diastolic (congestive) heart failure: Secondary | ICD-10-CM | POA: Diagnosis not present

## 2022-10-17 DIAGNOSIS — I700 Atherosclerosis of aorta: Secondary | ICD-10-CM

## 2022-10-17 DIAGNOSIS — R32 Unspecified urinary incontinence: Secondary | ICD-10-CM | POA: Diagnosis not present

## 2022-10-17 DIAGNOSIS — L97929 Non-pressure chronic ulcer of unspecified part of left lower leg with unspecified severity: Secondary | ICD-10-CM | POA: Diagnosis not present

## 2022-10-17 DIAGNOSIS — I872 Venous insufficiency (chronic) (peripheral): Secondary | ICD-10-CM

## 2022-10-17 DIAGNOSIS — Z6841 Body Mass Index (BMI) 40.0 and over, adult: Secondary | ICD-10-CM

## 2022-10-17 DIAGNOSIS — I83029 Varicose veins of left lower extremity with ulcer of unspecified site: Secondary | ICD-10-CM

## 2022-10-17 DIAGNOSIS — F339 Major depressive disorder, recurrent, unspecified: Secondary | ICD-10-CM

## 2022-10-17 DIAGNOSIS — K029 Dental caries, unspecified: Secondary | ICD-10-CM | POA: Diagnosis not present

## 2022-10-17 DIAGNOSIS — N3941 Urge incontinence: Secondary | ICD-10-CM

## 2022-10-17 MED ORDER — OXYBUTYNIN CHLORIDE 5 MG PO TABS: 5.0000 mg | ORAL_TABLET | Freq: Three times a day (TID) | ORAL | 0 refills | Status: DC

## 2022-10-17 MED ORDER — OXYBUTYNIN CHLORIDE 5 MG PO TABS: 5.0000 mg | ORAL_TABLET | Freq: Three times a day (TID) | ORAL | Status: DC

## 2022-10-17 NOTE — Patient Instructions (Signed)
[phone] [facility]  Remember to bring all of the medications that you take (including over the counter medications and supplements) with you to every clinic visit.  This after visit summary is an important review of tests, referrals, and medication changes that were discussed during your visit. If you have questions or concerns, call [phone]. Outside of clinic business hours, call the main hospital at [phone] and ask the operator for the on-call internal medicine resident.   Marrianne Mood MD 10/17/2022, 9:15 AM

## 2022-10-17 NOTE — Assessment & Plan Note (Signed)
Improving with Unna boots. Continue weekly application for now until wound care relationship is established.

## 2022-10-17 NOTE — Assessment & Plan Note (Deleted)
Improving with Unna boots. Continue weekly application for now until wound care relationship is established.

## 2022-10-17 NOTE — Assessment & Plan Note (Addendum)
Urge incontinence, often doesn't feel need to urinate until she is going. No dysuria. Minimal to no stress incontinence as far as she knows. Mirabegron not really working. Oxybutynin has worked for her in the past. Continue mirabegron, start oxybutynin and monitor for anticholinergic side-effects. She can take this medicine PRN prior to engagements when bladder control is more important for her.

## 2022-10-17 NOTE — Assessment & Plan Note (Signed)
Dyspnea at baseline on 2 liters via [place]. Volume optimized on exam. No changes to management today.

## 2022-10-17 NOTE — Progress Notes (Signed)
MD called requesting a pair of UNNA BOOTS to be applied to patient. Other ORTHO TECH applied

## 2022-10-17 NOTE — Progress Notes (Signed)
    Subjective:  Sharon Vasquez is a 72 y.o. who presents to clinic for the following:  Unna boot application. No new concerns today. Still bothered by incontinence, especially when out and about participating in social engagements.  Breathing stable. Legs are irritated but Unna boots seem to be helping. Has not heard from wound care yet. Hasn't heard from anyone regarding lift chair.  Objective:   Vitals:   10/17/22 0840  BP: (!) 140/91  Pulse: 67  Temp: 98.2 F (36.8 C)  TempSrc: Oral  SpO2: 93%  Height: 6\' 2"  (1.88 m)    Physical Exam Overall well appearing, no distress Heart rate normal, rhythm regular, radial and DP pulses strong bilaterally, no apparent JVD while upright Breathing regular and unlabored on 2 L via [place], no wheezing or crackles Skin warm and dry, chronic venous stasis changes to bilateral lower legs with clean appearing ulcers bilaterally, pictures in chart  Assessment & Plan:   Problem List Items Addressed This Visit     Morbid obesity (HCC) - Primary (Chronic)    Starting increased dose of Ozempic this week, finishing up prior supply. - semaglutide 1 mg weekly      Chronic venous insufficiency (Chronic)   Major depression, recurrent, chronic (HCC) (Chronic)    PHQ-9 today.     10/17/2022    8:45 AM 10/10/2022    1:48 PM 08/15/2022    3:41 PM 06/01/2022    1:52 PM 05/25/2022   10:36 AM  Depression screen PHQ 2/9  Decreased Interest 0 0 1 0 0  Down, Depressed, Hopeless 0 0 1 0 0  PHQ - 2 Score 0 0 2 0 0  Altered sleeping 0 0 3 1   Tired, decreased energy 3 3 3 2    Change in appetite 0 0 0 0   Feeling bad or failure about yourself  0 0 0 0   Trouble concentrating 0 0 0 0   Moving slowly or fidgety/restless 0 0 0 0   Suicidal thoughts 0 0 0 0   PHQ-9 Score 3 3 8 3    Difficult doing work/chores Somewhat difficult Somewhat difficult Somewhat difficult Somewhat difficult          Urinary incontinence (Chronic)    Urge incontinence, often  doesn't feel need to urinate until she is going. No dysuria. Minimal to no stress incontinence as far as she knows. Mirabegron not really working. Oxybutynin has worked for her in the past. Continue mirabegron, start oxybutynin and monitor for anticholinergic side-effects. She can take this medicine PRN prior to engagements when bladder control is more important for her.      Relevant Medications   oxybutynin (DITROPAN) 5 MG tablet   Venous stasis ulcer of left lower extremity (HCC) (Chronic)    Improving with Unna boots. Continue weekly application for now until wound care relationship is established.      (HFpEF) heart failure with preserved ejection fraction (HCC) (Chronic)    Dyspnea at baseline on 2 liters via [place]. Volume optimized on exam. No changes to management today.      Other Visit Diagnoses     Tooth decay       Relevant Orders   Ambulatory referral to Dentistry       Return in 1 week for Unna boot replacement.  Patient discussed with Dr. Durene Romans MD 10/17/2022, 9:38 AM  [phone]

## 2022-10-17 NOTE — Assessment & Plan Note (Signed)
Starting increased dose of Ozempic this week, finishing up prior supply. - semaglutide 1 mg weekly

## 2022-10-17 NOTE — Assessment & Plan Note (Signed)
PHQ-9 today.     10/17/2022    8:45 AM 10/10/2022    1:48 PM 08/15/2022    3:41 PM 06/01/2022    1:52 PM 05/25/2022   10:36 AM  Depression screen PHQ 2/9  Decreased Interest 0 0 1 0 0  Down, Depressed, Hopeless 0 0 1 0 0  PHQ - 2 Score 0 0 2 0 0  Altered sleeping 0 0 3 1   Tired, decreased energy 3 3 3 2    Change in appetite 0 0 0 0   Feeling bad or failure about yourself  0 0 0 0   Trouble concentrating 0 0 0 0   Moving slowly or fidgety/restless 0 0 0 0   Suicidal thoughts 0 0 0 0   PHQ-9 Score 3 3 8 3    Difficult doing work/chores Somewhat difficult Somewhat difficult Somewhat difficult Somewhat difficult    Patient's PHQ-9 has remained stable at 3.  She is currently not on any medications for this issue.  Previously she appeared to be on duloxetine.  Will continue to monitor closely.

## 2022-10-18 ENCOUNTER — Other Ambulatory Visit: Payer: Self-pay | Admitting: Pulmonary Disease

## 2022-10-18 NOTE — Progress Notes (Signed)
 Internal Medicine Clinic Attending  Case discussed with the resident at the time of the visit.  We reviewed the resident's history and exam and pertinent patient test results.  I agree with the assessment, diagnosis, and plan of care documented in the resident's note.  

## 2022-10-18 NOTE — Addendum Note (Signed)
Addended by: Earl Lagos on: 10/18/2022 10:00 AM   Modules accepted: Level of Service

## 2022-10-19 ENCOUNTER — Telehealth: Payer: Self-pay

## 2022-10-19 MED ORDER — WEGOVY 1 MG/0.5ML ~~LOC~~ SOAJ
1.0000 mg | SUBCUTANEOUS | 3 refills | Status: DC
Start: 2022-10-19 — End: 2022-11-06

## 2022-10-19 NOTE — Addendum Note (Signed)
Addended by: Marrianne Mood on: 10/19/2022 11:36 AM   Modules accepted: Orders

## 2022-10-19 NOTE — Telephone Encounter (Signed)
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 [employer] follows Medicare rules. The information we have about your case says your drug is being used for weight loss and per Medicare rules isnt covered.

## 2022-10-19 NOTE — Assessment & Plan Note (Signed)
Diagnosed by CT imaging in 2017. Discontinue Ozempic and start Wegovy for ASCVD risk reduction in this person with known cardiovascular disease and comorbid obesity.

## 2022-10-19 NOTE — Telephone Encounter (Signed)
Patient called in. She was made aware that Ozempic was denied by insurance and that a Rx for [facility] was sent to [facility] this AM.

## 2022-10-19 NOTE — Telephone Encounter (Signed)
Prior Authorization for patient (Ozempic) came through on cover my meds was submitted with last office notes awaiting approval or denial.  KEY:[id number]

## 2022-10-23 DIAGNOSIS — I10 Essential (primary) hypertension: Secondary | ICD-10-CM | POA: Diagnosis not present

## 2022-10-23 DIAGNOSIS — J449 Chronic obstructive pulmonary disease, unspecified: Secondary | ICD-10-CM | POA: Diagnosis not present

## 2022-10-23 DIAGNOSIS — E119 Type 2 diabetes mellitus without complications: Secondary | ICD-10-CM | POA: Diagnosis not present

## 2022-10-23 DIAGNOSIS — M25562 Pain in left knee: Secondary | ICD-10-CM | POA: Diagnosis not present

## 2022-10-23 DIAGNOSIS — G8929 Other chronic pain: Secondary | ICD-10-CM | POA: Diagnosis not present

## 2022-10-23 DIAGNOSIS — Z6841 Body Mass Index (BMI) 40.0 and over, adult: Secondary | ICD-10-CM | POA: Diagnosis not present

## 2022-10-23 DIAGNOSIS — R030 Elevated blood-pressure reading, without diagnosis of hypertension: Secondary | ICD-10-CM | POA: Diagnosis not present

## 2022-10-23 DIAGNOSIS — Z79899 Other long term (current) drug therapy: Secondary | ICD-10-CM | POA: Diagnosis not present

## 2022-10-23 DIAGNOSIS — F410 Panic disorder [episodic paroxysmal anxiety] without agoraphobia: Secondary | ICD-10-CM | POA: Diagnosis not present

## 2022-10-23 DIAGNOSIS — M25561 Pain in right knee: Secondary | ICD-10-CM | POA: Diagnosis not present

## 2022-10-23 NOTE — Telephone Encounter (Signed)
PT called twice. Since 7/11 no call back. Please contact. We tried to tell her no record of call and why but she wants the nurse to call her. TY.

## 2022-10-25 ENCOUNTER — Ambulatory Visit (INDEPENDENT_AMBULATORY_CARE_PROVIDER_SITE_OTHER): Payer: Medicare HMO | Admitting: Student

## 2022-10-25 VITALS — BP 150/70 | HR 69 | Temp 98.4°F

## 2022-10-25 DIAGNOSIS — L97529 Non-pressure chronic ulcer of other part of left foot with unspecified severity: Secondary | ICD-10-CM | POA: Diagnosis not present

## 2022-10-25 DIAGNOSIS — Z87891 Personal history of nicotine dependence: Secondary | ICD-10-CM | POA: Diagnosis not present

## 2022-10-25 DIAGNOSIS — I872 Venous insufficiency (chronic) (peripheral): Secondary | ICD-10-CM

## 2022-10-25 DIAGNOSIS — I739 Peripheral vascular disease, unspecified: Secondary | ICD-10-CM

## 2022-10-25 DIAGNOSIS — L97519 Non-pressure chronic ulcer of other part of right foot with unspecified severity: Secondary | ICD-10-CM

## 2022-10-25 DIAGNOSIS — M810 Age-related osteoporosis without current pathological fracture: Secondary | ICD-10-CM

## 2022-10-25 DIAGNOSIS — G4733 Obstructive sleep apnea (adult) (pediatric): Secondary | ICD-10-CM

## 2022-10-25 DIAGNOSIS — I5032 Chronic diastolic (congestive) heart failure: Secondary | ICD-10-CM | POA: Diagnosis not present

## 2022-10-25 DIAGNOSIS — Z1211 Encounter for screening for malignant neoplasm of colon: Secondary | ICD-10-CM

## 2022-10-25 MED ORDER — TORSEMIDE 20 MG PO TABS
ORAL_TABLET | ORAL | 1 refills | Status: DC
Start: 2022-10-25 — End: 2022-11-06

## 2022-10-25 MED ORDER — ALENDRONATE SODIUM 70 MG PO TABS: 70.0000 mg | ORAL_TABLET | ORAL | 11 refills | Status: DC

## 2022-10-25 NOTE — Assessment & Plan Note (Signed)
Dyspnea at rest, uses 2L [place] at home. Appears to be slightly more SOB today. Lungs clear but she believes she is retaining more fluid. She was unable to stand today to be weighed. PLAN: increased torsemide to 40qd. RTC in 1 week for evaluation and BMP.

## 2022-10-25 NOTE — Assessment & Plan Note (Signed)
Sleeping poorly, daytime fatigue. Used CPAP in past but has been off it. She will attempt to restart. Goal is 4-6 hours use every night. RTC in 1 week.

## 2022-10-25 NOTE — Assessment & Plan Note (Signed)
S/p angioplasty/stenting of her left common and external iliac veins for the treatment of severe chronic venous insufficiency. Two ulcers present today. She is on a statin. Oxycodone for pain.

## 2022-10-25 NOTE — Patient Instructions (Signed)
Sharon Vasquez,  Thank you for coming in today. This after visit summary is an important review of tests, referrals, and medication changes that were discussed during your visit. If you have questions or concerns, call the clinic at [phone] between 9am - 4pm. Outside of clinic business hours, call the main hospital at [phone] and ask the operator for the on-call internal medicine resident.   Please return in 1 week.  Best, Dr. Katheran James

## 2022-10-25 NOTE — Assessment & Plan Note (Signed)
Mild improvement as of late but weeping remains. There are bilateral venous stasis ulcers - posterior R leg and lateral R leg.  No signs of infection.  No asymmetry. Unna boots restarted last week (did well in past but stopped returning to clinic).  PLAN: Weekly visits for replacement for now as a temporizing measure.  Awaiting contact from wound team re ulcers

## 2022-10-25 NOTE — Progress Notes (Signed)
CC: Unna boot placement, chronic venous stasis  HPI:  [name] is a 72 y.o. female living with a history stated below and presents today for [employer] boot placement. Please see problem based assessment and plan for additional details.  Past Medical History:  Diagnosis Date   A-fib (HCC)    Acute hypoxemic respiratory failure (HCC) 02/20/2022   Acute on chronic hypoxic respiratory failure (HCC) 01/04/2022   Acute upper respiratory infection 01/08/2022   Anxiety    Arthritis    "qwhre; joints, back" (04/17/2017)   Atrial fibrillation (HCC)    Benign breast cyst in female, left 01/08/2017   Found by Screening mammogram, evaluated by [facility] on 01/08/17 and determined to be a benign simple breast cyst.   Breast cancer (HCC)    Cellulitis of left lower leg 05/30/2017   CHF (congestive heart failure) (HCC)    Chronic low back pain 08/21/2016   Chronic lower back pain    Chronic venous insufficiency    /notes 05/30/2017   COPD (chronic obstructive pulmonary disease) (HCC)    Depression    Diabetes (HCC) 01/08/2022   Diabetes mellitus without complication (HCC)    DVT (deep venous thrombosis) (HCC) 11/16/2016   Dysrhythmia    Esophageal dysmotility 11/10/2019   Previous workup by ENT with fiberoptic leryngoscopy which was normal. Dx with laryngeal pharyngeal reflux.   GERD (gastroesophageal reflux disease)    Headache    "weekly for the last 3 months" (04/17/2017)   History of pulmonary embolism    Pulmonary embolism   Hyperlipidemia    Hypertension    Laryngopharyngeal reflux 12/18/2015   Morbid obesity (HCC)    PE (pulmonary embolism)    Pulmonary embolism (HCC) 09/21/2014   Sleep apnea    Stroke (HCC) 12/02/2021    Current Outpatient Medications on File Prior to Visit  Medication Sig Dispense Refill   albuterol (VENTOLIN HFA) 108 (90 Base) MCG/ACT inhaler Inhale 2 puffs into the lungs every 4 (four) hours as needed for shortness of breath. 18 g 1   aspirin EC 81 MG  tablet Take 81 mg by mouth daily.     atorvastatin (LIPITOR) 80 MG tablet Take 1 tablet (80 mg total) by mouth daily. 100 tablet 2   esomeprazole (NEXIUM) 40 MG capsule Take 1 capsule (40 mg total) by mouth daily as needed (for heartburn or indigestion). (Patient taking differently: Take 40 mg by mouth daily.) 90 capsule 1   Fluticasone-Umeclidin-Vilant (TRELEGY ELLIPTA) 100-62.5-25 MCG/ACT AEPB Inhale 1 puff into the lungs daily. 60 each 5   gabapentin (NEURONTIN) 400 MG capsule TAKE 1 CAPSULE BY MOUTH TWICE  DAILY 200 capsule 2   ipratropium-albuterol (DUONEB) 0.5-2.5 (3) MG/3ML SOLN Take 3 mLs by nebulization every 6 (six) hours as needed. (Patient taking differently: Take 3 mLs by nebulization every 6 (six) hours as needed (shortness of breath).) 360 mL 3   letrozole (FEMARA) 2.5 MG tablet TAKE 1 TABLET(2.5 MG) BY MOUTH DAILY 90 tablet 3   losartan (COZAAR) 25 MG tablet Take 1 tablet (25 mg total) by mouth daily. 30 tablet 11   metoprolol succinate (TOPROL-XL) 25 MG 24 hr tablet TAKE 1 TABLET BY MOUTH DAILY 100 tablet 2   MYRBETRIQ 50 MG TB24 tablet TAKE 1 TABLET BY MOUTH DAILY 100 tablet 2   naloxone (NARCAN) nasal spray 4 mg/0.1 mL Place 1 spray into the nose once as needed (for overdose suspected). for opioid overdose     oxybutynin (DITROPAN) 5 MG tablet Take  1 tablet (5 mg total) by mouth 3 (three) times daily.     oxyCODONE (ROXICODONE) 15 MG immediate release tablet Take 15 mg by mouth every 6 (six) hours as needed for pain.     OXYGEN Inhale 2-3 L into the lungs daily.     potassium chloride SA (KLOR-CON M) 20 MEQ tablet Take 1 tablet (20 mEq total) by mouth daily. 90 tablet 0   PRESCRIPTION MEDICATION Inhale into the lungs at bedtime. cpap     rivaroxaban (XARELTO) 20 MG TABS tablet Take 1 tablet (20 mg total) by mouth every morning. 90 tablet 3   Semaglutide-Weight Management (WEGOVY) 1 MG/0.5ML SOAJ Inject 1 mg into the skin once a week. 2 mL 3   No current facility-administered  medications on file prior to visit.    Family History  Problem Relation Age of Onset   Breast cancer Mother        before age 12   Hypertension Mother    Hyperlipidemia Mother    Hypertension Maternal Grandfather    Hyperlipidemia Maternal Grandfather     Social History   Socioeconomic History   Marital status: Divorced    Spouse name: Not on file   Number of children: Not on file   Years of education: Not on file   Highest education level: Not on file  Occupational History   Not on file  Tobacco Use   Smoking status: Former    Current packs/day: 0.00    Average packs/day: 2.0 packs/day for 60.4 years (120.8 ttl pk-yrs)    Types: Cigarettes    Start date: 08/16/1961    Quit date: 01/02/2022    Years since quitting: 0.8   Smokeless tobacco: Never   Tobacco comments:    Pt smokes about 4 ciggs daily. Stopped 01/02/2022   Vaping Use   Vaping status: Never Used  Substance and Sexual Activity   Alcohol use: No   Drug use: No   Sexual activity: Not Currently    Partners: Male  Other Topics Concern   Not on file  Social History Narrative   Lives in [facility] in [place]. Lives alone, but is dependent in ADLs/IADLs. Previously had HH PT and RN but dismissed them with plans to use the [employer]. Two daughters live nearby. Previously resided in [place].   Social Determinants of Health   Financial Resource Strain: Medium Risk (12/02/2021)   Overall Financial Resource Strain (CARDIA)    Difficulty of Paying Living Expenses: Somewhat hard  Food Insecurity: No Food Insecurity (09/22/2022)   Hunger Vital Sign    Worried About Running Out of Food in the Last Year: Never true    Ran Out of Food in the Last Year: Never true  Transportation Needs: No Transportation Needs (09/22/2022)   PRAPARE - Administrator, Civil Service (Medical): No    Lack of Transportation (Non-Medical): No  Physical Activity: Inactive (12/02/2021)   Exercise Vital Sign    Days of  Exercise per Week: 0 days    Minutes of Exercise per Session: 0 min  Stress: No Stress Concern Present (12/02/2021)   [employer] of Occupational Health - Occupational Stress Questionnaire    Feeling of Stress : Only a little  Social Connections: Moderately Integrated (03/15/2022)   Social Connection and Isolation Panel [NHANES]    Frequency of Communication with Friends and Family: More than three times a week    Frequency of Social Gatherings with Friends and Family: More than  three times a week    Attends Religious Services: More than 4 times per year    Active Member of Clubs or Organizations: Yes    Attends Banker Meetings: More than 4 times per year    Marital Status: Divorced  Intimate Partner Violence: Not At Risk (03/15/2022)   Humiliation, Afraid, Rape, and Kick questionnaire    Fear of Current or Ex-Partner: No    Emotionally Abused: No    Physically Abused: No    Sexually Abused: No    Review of Systems: ROS negative except for what is noted on the assessment and plan.  Vitals:   10/25/22 1420  BP: (!) 150/70  Pulse: 69  Temp: 98.4 F (36.9 C)  TempSrc: Oral  SpO2: 96%    Physical Exam: Constitutional: well-appearing female sitting in wheelchair, in no acute distress HENT: normocephalic atraumatic, mucous membranes moist Eyes: conjunctiva non-erythematous Cardiovascular: regular rate and rhythm, no m/r/g Pulmonary/Chest: normal work of breathing on room air, lungs clear to auscultation bilaterally, heavy breathing Abdominal: soft, non-tender, non-distended MSK: normal bulk and tone, venous stasis ulcers present on posterior R LE and lateral L LE that are weeping but not infected Neurological: alert & oriented x 3, no focal deficit Skin: warm and dry Psych: normal mood and behavior  Assessment & Plan:   Patient seen with Dr. Heide Spark  Chronic venous insufficiency Mild improvement as of late but weeping remains. There are bilateral venous  stasis ulcers - posterior R leg and lateral R leg.  No signs of infection.  No asymmetry. Unna boots restarted last week (did well in past but stopped returning to clinic).  PLAN: Weekly visits for replacement for now as a temporizing measure.  Awaiting contact from wound team re ulcers  Peripheral vascular disease (HCC) S/p angioplasty/stenting of her left common and external iliac veins for the treatment of severe chronic venous insufficiency. Two ulcers present today. She is on a statin. Oxycodone for pain.  OSA (obstructive sleep apnea) Sleeping poorly, daytime fatigue. Used CPAP in past but has been off it. She will attempt to restart. Goal is 4-6 hours use every night. RTC in 1 week.  Osteoporosis Most recent DEXA -2.9. She was on alendronate in the past but stopped in 2019, no reason why in the chart. Will restart today. Instructed to take in AM on empty stomach, do not recline after. PLAN: Alendronate 70 once per week  (HFpEF) heart failure with preserved ejection fraction (HCC) Dyspnea at rest, uses 2L [place] at home. Appears to be slightly more SOB today. Lungs clear but she believes she is retaining more fluid. She was unable to stand today to be weighed. PLAN: increased torsemide to 40qd. RTC in 1 week for evaluation and BMP.  At next visit, evaluate in suspicion of volume overload/ possible CHF. Check that she has returned to her CPAP. Check stasis ulcers/ unna boot/ connection with wound team.  Katheran James, D.O. [facility] Internal Medicine, PGY-1 Phone: [phone] Date 10/25/2022 Time 3:28 PM

## 2022-10-25 NOTE — Assessment & Plan Note (Signed)
Most recent DEXA -2.9. She was on alendronate in the past but stopped in 2019, no reason why in the chart. Will restart today. Instructed to take in AM on empty stomach, do not recline after. PLAN: Alendronate 70 once per week

## 2022-10-26 DIAGNOSIS — Z79899 Other long term (current) drug therapy: Secondary | ICD-10-CM | POA: Diagnosis not present

## 2022-10-28 ENCOUNTER — Encounter (HOSPITAL_COMMUNITY): Payer: Self-pay

## 2022-10-28 ENCOUNTER — Emergency Department (HOSPITAL_COMMUNITY): Payer: Medicare HMO

## 2022-10-28 ENCOUNTER — Other Ambulatory Visit: Payer: Self-pay

## 2022-10-28 ENCOUNTER — Inpatient Hospital Stay (HOSPITAL_COMMUNITY): Payer: Medicare HMO

## 2022-10-28 ENCOUNTER — Inpatient Hospital Stay (HOSPITAL_COMMUNITY)
Admission: EM | Admit: 2022-10-28 | Discharge: 2022-11-06 | DRG: 286 | Disposition: A | Discharge status: Skilled Nursing Facility | Payer: Medicare HMO | Attending: Internal Medicine | Admitting: Internal Medicine

## 2022-10-28 DIAGNOSIS — B9620 Unspecified Escherichia coli [E. coli] as the cause of diseases classified elsewhere: Secondary | ICD-10-CM | POA: Diagnosis present

## 2022-10-28 DIAGNOSIS — E119 Type 2 diabetes mellitus without complications: Secondary | ICD-10-CM | POA: Diagnosis not present

## 2022-10-28 DIAGNOSIS — Z79899 Other long term (current) drug therapy: Secondary | ICD-10-CM

## 2022-10-28 DIAGNOSIS — J449 Chronic obstructive pulmonary disease, unspecified: Secondary | ICD-10-CM | POA: Diagnosis not present

## 2022-10-28 DIAGNOSIS — J440 Chronic obstructive pulmonary disease with acute lower respiratory infection: Secondary | ICD-10-CM | POA: Diagnosis present

## 2022-10-28 DIAGNOSIS — I4891 Unspecified atrial fibrillation: Secondary | ICD-10-CM | POA: Diagnosis not present

## 2022-10-28 DIAGNOSIS — Z170 Estrogen receptor positive status [ER+]: Secondary | ICD-10-CM

## 2022-10-28 DIAGNOSIS — I110 Hypertensive heart disease with heart failure: Principal | ICD-10-CM | POA: Diagnosis present

## 2022-10-28 DIAGNOSIS — Z8673 Personal history of transient ischemic attack (TIA), and cerebral infarction without residual deficits: Secondary | ICD-10-CM

## 2022-10-28 DIAGNOSIS — J810 Acute pulmonary edema: Secondary | ICD-10-CM | POA: Diagnosis present

## 2022-10-28 DIAGNOSIS — Z4682 Encounter for fitting and adjustment of non-vascular catheter: Secondary | ICD-10-CM | POA: Diagnosis not present

## 2022-10-28 DIAGNOSIS — E872 Acidosis, unspecified: Secondary | ICD-10-CM | POA: Diagnosis not present

## 2022-10-28 DIAGNOSIS — J9621 Acute and chronic respiratory failure with hypoxia: Secondary | ICD-10-CM | POA: Diagnosis not present

## 2022-10-28 DIAGNOSIS — Z1639 Resistance to other specified antimicrobial drug: Secondary | ICD-10-CM | POA: Diagnosis present

## 2022-10-28 DIAGNOSIS — Z7901 Long term (current) use of anticoagulants: Secondary | ICD-10-CM

## 2022-10-28 DIAGNOSIS — F17210 Nicotine dependence, cigarettes, uncomplicated: Secondary | ICD-10-CM | POA: Diagnosis present

## 2022-10-28 DIAGNOSIS — Z803 Family history of malignant neoplasm of breast: Secondary | ICD-10-CM

## 2022-10-28 DIAGNOSIS — Z87891 Personal history of nicotine dependence: Secondary | ICD-10-CM | POA: Diagnosis not present

## 2022-10-28 DIAGNOSIS — J9611 Chronic respiratory failure with hypoxia: Secondary | ICD-10-CM | POA: Diagnosis not present

## 2022-10-28 DIAGNOSIS — I469 Cardiac arrest, cause unspecified: Secondary | ICD-10-CM

## 2022-10-28 DIAGNOSIS — J811 Chronic pulmonary edema: Secondary | ICD-10-CM | POA: Diagnosis not present

## 2022-10-28 DIAGNOSIS — J189 Pneumonia, unspecified organism: Secondary | ICD-10-CM | POA: Diagnosis not present

## 2022-10-28 DIAGNOSIS — Z951 Presence of aortocoronary bypass graft: Secondary | ICD-10-CM | POA: Diagnosis not present

## 2022-10-28 DIAGNOSIS — Z1152 Encounter for screening for COVID-19: Secondary | ICD-10-CM | POA: Diagnosis not present

## 2022-10-28 DIAGNOSIS — I272 Pulmonary hypertension, unspecified: Secondary | ICD-10-CM | POA: Diagnosis present

## 2022-10-28 DIAGNOSIS — I5041 Acute combined systolic (congestive) and diastolic (congestive) heart failure: Secondary | ICD-10-CM | POA: Diagnosis not present

## 2022-10-28 DIAGNOSIS — J9622 Acute and chronic respiratory failure with hypercapnia: Secondary | ICD-10-CM | POA: Diagnosis present

## 2022-10-28 DIAGNOSIS — R7989 Other specified abnormal findings of blood chemistry: Secondary | ICD-10-CM | POA: Diagnosis present

## 2022-10-28 DIAGNOSIS — J90 Pleural effusion, not elsewhere classified: Secondary | ICD-10-CM | POA: Diagnosis not present

## 2022-10-28 DIAGNOSIS — Z79891 Long term (current) use of opiate analgesic: Secondary | ICD-10-CM

## 2022-10-28 DIAGNOSIS — R0602 Shortness of breath: Secondary | ICD-10-CM | POA: Diagnosis present

## 2022-10-28 DIAGNOSIS — R578 Other shock: Secondary | ICD-10-CM | POA: Diagnosis not present

## 2022-10-28 DIAGNOSIS — Z9981 Dependence on supplemental oxygen: Secondary | ICD-10-CM | POA: Diagnosis not present

## 2022-10-28 DIAGNOSIS — Z7401 Bed confinement status: Secondary | ICD-10-CM | POA: Diagnosis not present

## 2022-10-28 DIAGNOSIS — M6259 Muscle wasting and atrophy, not elsewhere classified, multiple sites: Secondary | ICD-10-CM | POA: Diagnosis not present

## 2022-10-28 DIAGNOSIS — Z741 Need for assistance with personal care: Secondary | ICD-10-CM | POA: Diagnosis not present

## 2022-10-28 DIAGNOSIS — R918 Other nonspecific abnormal finding of lung field: Secondary | ICD-10-CM | POA: Diagnosis not present

## 2022-10-28 DIAGNOSIS — I509 Heart failure, unspecified: Secondary | ICD-10-CM

## 2022-10-28 DIAGNOSIS — Z7951 Long term (current) use of inhaled steroids: Secondary | ICD-10-CM

## 2022-10-28 DIAGNOSIS — I5023 Acute on chronic systolic (congestive) heart failure: Secondary | ICD-10-CM | POA: Diagnosis present

## 2022-10-28 DIAGNOSIS — I517 Cardiomegaly: Secondary | ICD-10-CM | POA: Diagnosis not present

## 2022-10-28 DIAGNOSIS — Z86718 Personal history of other venous thrombosis and embolism: Secondary | ICD-10-CM

## 2022-10-28 DIAGNOSIS — E785 Hyperlipidemia, unspecified: Secondary | ICD-10-CM | POA: Diagnosis present

## 2022-10-28 DIAGNOSIS — Z86711 Personal history of pulmonary embolism: Secondary | ICD-10-CM

## 2022-10-28 DIAGNOSIS — Z5986 Financial insecurity: Secondary | ICD-10-CM

## 2022-10-28 DIAGNOSIS — R2681 Unsteadiness on feet: Secondary | ICD-10-CM | POA: Diagnosis not present

## 2022-10-28 DIAGNOSIS — Z7982 Long term (current) use of aspirin: Secondary | ICD-10-CM

## 2022-10-28 DIAGNOSIS — Z90710 Acquired absence of both cervix and uterus: Secondary | ICD-10-CM

## 2022-10-28 DIAGNOSIS — I5043 Acute on chronic combined systolic (congestive) and diastolic (congestive) heart failure: Secondary | ICD-10-CM | POA: Diagnosis present

## 2022-10-28 DIAGNOSIS — Z7983 Long term (current) use of bisphosphonates: Secondary | ICD-10-CM

## 2022-10-28 DIAGNOSIS — I468 Cardiac arrest due to other underlying condition: Secondary | ICD-10-CM | POA: Diagnosis not present

## 2022-10-28 DIAGNOSIS — Z79811 Long term (current) use of aromatase inhibitors: Secondary | ICD-10-CM

## 2022-10-28 DIAGNOSIS — K219 Gastro-esophageal reflux disease without esophagitis: Secondary | ICD-10-CM | POA: Diagnosis present

## 2022-10-28 DIAGNOSIS — I700 Atherosclerosis of aorta: Secondary | ICD-10-CM

## 2022-10-28 DIAGNOSIS — Z8249 Family history of ischemic heart disease and other diseases of the circulatory system: Secondary | ICD-10-CM

## 2022-10-28 DIAGNOSIS — I952 Hypotension due to drugs: Secondary | ICD-10-CM | POA: Diagnosis not present

## 2022-10-28 DIAGNOSIS — E876 Hypokalemia: Secondary | ICD-10-CM | POA: Diagnosis present

## 2022-10-28 DIAGNOSIS — I5181 Takotsubo syndrome: Secondary | ICD-10-CM | POA: Diagnosis present

## 2022-10-28 DIAGNOSIS — Z452 Encounter for adjustment and management of vascular access device: Secondary | ICD-10-CM | POA: Diagnosis not present

## 2022-10-28 DIAGNOSIS — Z743 Need for continuous supervision: Secondary | ICD-10-CM | POA: Diagnosis not present

## 2022-10-28 DIAGNOSIS — J9690 Respiratory failure, unspecified, unspecified whether with hypoxia or hypercapnia: Secondary | ICD-10-CM | POA: Diagnosis not present

## 2022-10-28 DIAGNOSIS — N179 Acute kidney failure, unspecified: Secondary | ICD-10-CM | POA: Diagnosis present

## 2022-10-28 DIAGNOSIS — T17998A Other foreign object in respiratory tract, part unspecified causing other injury, initial encounter: Secondary | ICD-10-CM | POA: Diagnosis not present

## 2022-10-28 DIAGNOSIS — Z6841 Body Mass Index (BMI) 40.0 and over, adult: Secondary | ICD-10-CM

## 2022-10-28 DIAGNOSIS — Z7985 Long-term (current) use of injectable non-insulin antidiabetic drugs: Secondary | ICD-10-CM

## 2022-10-28 DIAGNOSIS — M6281 Muscle weakness (generalized): Secondary | ICD-10-CM | POA: Diagnosis not present

## 2022-10-28 DIAGNOSIS — J849 Interstitial pulmonary disease, unspecified: Secondary | ICD-10-CM | POA: Diagnosis present

## 2022-10-28 DIAGNOSIS — N3000 Acute cystitis without hematuria: Secondary | ICD-10-CM | POA: Diagnosis not present

## 2022-10-28 DIAGNOSIS — Z83438 Family history of other disorder of lipoprotein metabolism and other lipidemia: Secondary | ICD-10-CM

## 2022-10-28 DIAGNOSIS — I2510 Atherosclerotic heart disease of native coronary artery without angina pectoris: Secondary | ICD-10-CM | POA: Diagnosis not present

## 2022-10-28 DIAGNOSIS — R1311 Dysphagia, oral phase: Secondary | ICD-10-CM | POA: Diagnosis not present

## 2022-10-28 DIAGNOSIS — G4733 Obstructive sleep apnea (adult) (pediatric): Secondary | ICD-10-CM | POA: Diagnosis present

## 2022-10-28 DIAGNOSIS — Z91199 Patient's noncompliance with other medical treatment and regimen due to unspecified reason: Secondary | ICD-10-CM

## 2022-10-28 DIAGNOSIS — Z853 Personal history of malignant neoplasm of breast: Secondary | ICD-10-CM

## 2022-10-28 DIAGNOSIS — I890 Lymphedema, not elsewhere classified: Secondary | ICD-10-CM | POA: Diagnosis present

## 2022-10-28 DIAGNOSIS — I5021 Acute systolic (congestive) heart failure: Secondary | ICD-10-CM | POA: Diagnosis not present

## 2022-10-28 DIAGNOSIS — J9601 Acute respiratory failure with hypoxia: Secondary | ICD-10-CM | POA: Diagnosis not present

## 2022-10-28 DIAGNOSIS — R6889 Other general symptoms and signs: Secondary | ICD-10-CM | POA: Diagnosis not present

## 2022-10-28 HISTORY — DX: Acute on chronic systolic (congestive) heart failure: I50.23

## 2022-10-28 LAB — TROPONIN I (HIGH SENSITIVITY): Troponin I (High Sensitivity): 20 ng/L — ABNORMAL HIGH (ref <18)

## 2022-10-28 LAB — I-STAT ARTERIAL BLOOD GAS, ED
Acid-base deficit: 6 mmol/L — ABNORMAL HIGH (ref 0.0–2.0)
Bicarbonate: 23.2 mmol/L (ref 20.0–28.0)
Calcium, Ion: 1.19 mmol/L (ref 1.15–1.40)
HCT: 38 % (ref 36.0–46.0)
Hemoglobin: 12.9 g/dL (ref 12.0–15.0)
O2 Saturation: 91 %
Patient temperature: 98.6
Potassium: 3.7 mmol/L (ref 3.5–5.1)
Sodium: 142 mmol/L (ref 135–145)
TCO2: 25 mmol/L (ref 22–32)
pCO2 arterial: 63.2 mmHg — ABNORMAL HIGH (ref 32–48)
pH, Arterial: 7.173 — CL (ref 7.35–7.45)
pO2, Arterial: 79 mmHg — ABNORMAL LOW (ref 83–108)

## 2022-10-28 LAB — COMPREHENSIVE METABOLIC PANEL
ALT: 21 U/L (ref 0–44)
AST: 26 U/L (ref 15–41)
Albumin: 3.8 g/dL (ref 3.5–5.0)
Alkaline Phosphatase: 112 U/L (ref 38–126)
Anion gap: 19 — ABNORMAL HIGH (ref 5–15)
BUN: 13 mg/dL (ref 8–23)
CO2: 19 mmol/L — ABNORMAL LOW (ref 22–32)
Calcium: 9.2 mg/dL (ref 8.9–10.3)
Chloride: 103 mmol/L (ref 98–111)
Creatinine, Ser: 1.16 mg/dL — ABNORMAL HIGH (ref 0.44–1.00)
GFR, Estimated: 50 mL/min — ABNORMAL LOW (ref >60)
Glucose, Bld: 267 mg/dL — ABNORMAL HIGH (ref 70–99)
Potassium: 3.8 mmol/L (ref 3.5–5.1)
Sodium: 141 mmol/L (ref 135–145)
Total Bilirubin: 0.4 mg/dL (ref 0.3–1.2)
Total Protein: 7.6 g/dL (ref 6.5–8.1)

## 2022-10-28 LAB — CBC WITH DIFFERENTIAL/PLATELET
Abs Immature Granulocytes: 0 10*3/uL (ref 0.00–0.07)
Basophils Absolute: 0 10*3/uL (ref 0.0–0.1)
Basophils Relative: 0 %
Eosinophils Absolute: 0.4 10*3/uL (ref 0.0–0.5)
Eosinophils Relative: 3 %
HCT: 48.2 % — ABNORMAL HIGH (ref 36.0–46.0)
Hemoglobin: 15.1 g/dL — ABNORMAL HIGH (ref 12.0–15.0)
Lymphocytes Relative: 51 %
Lymphs Abs: 6.5 10*3/uL — ABNORMAL HIGH (ref 0.7–4.0)
MCH: 28.2 pg (ref 26.0–34.0)
MCHC: 31.3 g/dL (ref 30.0–36.0)
MCV: 89.9 fL (ref 80.0–100.0)
Monocytes Absolute: 0.1 10*3/uL (ref 0.1–1.0)
Monocytes Relative: 1 %
Neutro Abs: 5.7 10*3/uL (ref 1.7–7.7)
Neutrophils Relative %: 45 %
Platelets: 704 10*3/uL — ABNORMAL HIGH (ref 150–400)
RBC: 5.36 MIL/uL — ABNORMAL HIGH (ref 3.87–5.11)
RDW: 13.9 % (ref 11.5–15.5)
WBC: 12.7 10*3/uL — ABNORMAL HIGH (ref 4.0–10.5)
nRBC: 0 /100 WBC
nRBC: 0.2 % (ref 0.0–0.2)

## 2022-10-28 LAB — PROTIME-INR
INR: 1.5 — ABNORMAL HIGH (ref 0.8–1.2)
Prothrombin Time: 18.6 seconds — ABNORMAL HIGH (ref 11.4–15.2)

## 2022-10-28 LAB — URINALYSIS, W/ REFLEX TO CULTURE (INFECTION SUSPECTED)
Bilirubin Urine: NEGATIVE
Glucose, UA: 50 mg/dL — AB
Hgb urine dipstick: NEGATIVE
Ketones, ur: NEGATIVE mg/dL
Nitrite: NEGATIVE
Protein, ur: >=300 mg/dL — AB
Specific Gravity, Urine: 1.011 (ref 1.005–1.030)
WBC, UA: >50 WBC/hpf (ref 0–5)
pH: 7 (ref 5.0–8.0)

## 2022-10-28 LAB — I-STAT CHEM 8, ED
BUN: 15 mg/dL (ref 8–23)
Calcium, Ion: 1.16 mmol/L (ref 1.15–1.40)
Chloride: 107 mmol/L (ref 98–111)
Creatinine, Ser: 1.1 mg/dL — ABNORMAL HIGH (ref 0.44–1.00)
Glucose, Bld: 261 mg/dL — ABNORMAL HIGH (ref 70–99)
HCT: 46 % (ref 36.0–46.0)
Hemoglobin: 15.6 g/dL — ABNORMAL HIGH (ref 12.0–15.0)
Potassium: 3.9 mmol/L (ref 3.5–5.1)
Sodium: 142 mmol/L (ref 135–145)
TCO2: 22 mmol/L (ref 22–32)

## 2022-10-28 LAB — I-STAT CG4 LACTIC ACID, ED: Lactic Acid, Venous: 4.2 mmol/L (ref 0.5–1.9)

## 2022-10-28 LAB — BRAIN NATRIURETIC PEPTIDE: B Natriuretic Peptide: 630.2 pg/mL — ABNORMAL HIGH (ref 0.0–100.0)

## 2022-10-28 MED ORDER — DOCUSATE SODIUM 50 MG/5ML PO LIQD
100.0000 mg | Freq: Two times a day (BID) | ORAL | Status: DC
  Administered 2022-10-29 – 2022-10-31 (×5): 100 mg
  Filled 2022-10-28 (×4): qty 10

## 2022-10-28 MED ORDER — POLYETHYLENE GLYCOL 3350 17 G PO PACK
17.0000 g | PACK | Freq: Every day | ORAL | Status: DC
  Administered 2022-10-29 – 2022-10-31 (×3): 17 g
  Filled 2022-10-28 (×3): qty 1

## 2022-10-28 MED ORDER — DOCUSATE SODIUM 50 MG/5ML PO LIQD: 100.0000 mg | Freq: Two times a day (BID) | ORAL | Status: DC | PRN

## 2022-10-28 MED ORDER — FENTANYL 2500MCG IN NS 250ML (10MCG/ML) PREMIX INFUSION
25.0000 ug/h | INTRAVENOUS | Status: DC
  Administered 2022-10-28: 50 ug/h via INTRAVENOUS
  Administered 2022-10-29 – 2022-10-30 (×3): 200 ug/h via INTRAVENOUS
  Filled 2022-10-28 (×4): qty 250

## 2022-10-28 MED ORDER — PROPOFOL 1000 MG/100ML IV EMUL
5.0000 ug/kg/min | INTRAVENOUS | Status: DC
  Administered 2022-10-28: 5 ug/kg/min via INTRAVENOUS

## 2022-10-28 MED ORDER — NITROGLYCERIN IN D5W 200-5 MCG/ML-% IV SOLN
0.0000 ug/min | INTRAVENOUS | Status: DC
  Administered 2022-10-28: 200 ug/min via INTRAVENOUS

## 2022-10-28 MED ORDER — SODIUM CHLORIDE 0.9 % IV SOLN
2.0000 g | Freq: Once | INTRAVENOUS | Status: AC
  Administered 2022-10-28: 2 g via INTRAVENOUS
  Filled 2022-10-28: qty 20

## 2022-10-28 MED ORDER — HEPARIN SODIUM (PORCINE) 5000 UNIT/ML IJ SOLN: 5000.0000 [IU] | Freq: Three times a day (TID) | INTRAMUSCULAR | Status: DC

## 2022-10-28 MED ORDER — NOREPINEPHRINE 4 MG/250ML-% IV SOLN
0.0000 ug/min | INTRAVENOUS | Status: DC
  Administered 2022-10-28: 2 ug/min via INTRAVENOUS
  Administered 2022-10-29: 12 ug/min via INTRAVENOUS
  Administered 2022-10-29: 3 ug/min via INTRAVENOUS
  Administered 2022-10-29: 30 ug/min via INTRAVENOUS
  Filled 2022-10-28 (×4): qty 250

## 2022-10-28 MED ORDER — FENTANYL CITRATE PF 50 MCG/ML IJ SOSY: 25.0000 ug | PREFILLED_SYRINGE | INTRAMUSCULAR | Status: DC | PRN

## 2022-10-28 MED ORDER — ETOMIDATE 2 MG/ML IV SOLN
30.0000 mg | Freq: Once | INTRAVENOUS | Status: AC
  Administered 2022-10-28: 30 mg via INTRAVENOUS

## 2022-10-28 MED ORDER — ETOMIDATE 2 MG/ML IV SOLN
INTRAVENOUS | Status: DC | PRN
  Administered 2022-10-28: 10 mg via INTRAVENOUS

## 2022-10-28 MED ORDER — FUROSEMIDE 10 MG/ML IJ SOLN
80.0000 mg | Freq: Once | INTRAMUSCULAR | Status: AC
  Administered 2022-10-28: 80 mg via INTRAVENOUS
  Filled 2022-10-28: qty 8

## 2022-10-28 MED ORDER — POLYETHYLENE GLYCOL 3350 17 G PO PACK
17.0000 g | PACK | Freq: Every day | ORAL | Status: DC | PRN
  Filled 2022-10-28: qty 1

## 2022-10-28 MED ORDER — PROPOFOL 1000 MG/100ML IV EMUL
0.0000 ug/kg/min | INTRAVENOUS | Status: DC
  Administered 2022-10-28: 20 ug/kg/min via INTRAVENOUS
  Administered 2022-10-29: 25 ug/kg/min via INTRAVENOUS
  Administered 2022-10-29: 10 ug/kg/min via INTRAVENOUS
  Administered 2022-10-29: 25 ug/kg/min via INTRAVENOUS
  Filled 2022-10-28 (×3): qty 100

## 2022-10-28 MED ORDER — ROCURONIUM BROMIDE 10 MG/ML (PF) SYRINGE
100.0000 mg | PREFILLED_SYRINGE | Freq: Once | INTRAVENOUS | Status: AC
  Administered 2022-10-28: 100 mg via INTRAVENOUS

## 2022-10-28 MED ORDER — METHYLPREDNISOLONE SODIUM SUCC 125 MG IJ SOLR
125.0000 mg | Freq: Once | INTRAMUSCULAR | Status: AC
  Administered 2022-10-28: 125 mg via INTRAVENOUS
  Filled 2022-10-28: qty 2

## 2022-10-28 MED ORDER — SODIUM CHLORIDE 0.9 % IV SOLN
500.0000 mg | Freq: Once | INTRAVENOUS | Status: AC
  Administered 2022-10-28: 500 mg via INTRAVENOUS
  Filled 2022-10-28: qty 5

## 2022-10-28 MED ORDER — FENTANYL BOLUS VIA INFUSION: 25.0000 ug | INTRAVENOUS | Status: DC | PRN

## 2022-10-28 MED ORDER — FENTANYL CITRATE PF 50 MCG/ML IJ SOSY: 25.0000 ug | PREFILLED_SYRINGE | Freq: Once | INTRAMUSCULAR | Status: DC

## 2022-10-28 NOTE — Progress Notes (Signed)
Pt being followed by [facility] for Sepsis protocol. 

## 2022-10-28 NOTE — ED Triage Notes (Signed)
Arrives [facility] - EMS from home with call out for respiratory failure. Found at ~66% on home 3L [place].   Hx COPD, CHF, previous MI   Admini by paramedics: 0.5mg  Epi IM at 2115 2 duonebs

## 2022-10-28 NOTE — ED Notes (Signed)
Unable to obtain another peripheral IV. Ultrasound team at the bedside

## 2022-10-28 NOTE — ED Provider Notes (Signed)
[place] EMERGENCY DEPARTMENT AT [facility] Provider Note  CSN: [id number] Arrival date & time: 10/28/22 2145  Chief Complaint(s) Respiratory Distress  HPI Sharon Vasquez is a 72 y.o. female with past medical history as below, significant for afib, CHF< COPD, arthritis, cellulitis, COPD, DM who presents to the ED with complaint of resp distress. Pt here with EMS, pt unable to provide history 2/2 resp distress. EMS called to house for dib, unsure duration. Pulse ox 60-70% on RA at on EMS arrival, given duoneb x2 and IM epi x1. Unable to tolerate CPAP per EMS. On arrival acute resp distress, unable to communicate effectively, will transition to BIPAP  Level 5 caveat resp distress  Past Medical History Past Medical History:  Diagnosis Date   A-fib (HCC)    Acute hypoxemic respiratory failure (HCC) 02/20/2022   Acute on chronic hypoxic respiratory failure (HCC) 01/04/2022   Acute upper respiratory infection 01/08/2022   Anxiety    Arthritis    "qwhre; joints, back" (04/17/2017)   Atrial fibrillation (HCC)    Benign breast cyst in female, left 01/08/2017   Found by Screening mammogram, evaluated by U/[facility] on 01/08/17 and determined to be a benign simple breast cyst.   Breast cancer (HCC)    Cellulitis of left lower leg 05/30/2017   CHF (congestive heart failure) (HCC)    Chronic low back pain 08/21/2016   Chronic lower back pain    Chronic venous insufficiency    /notes 05/30/2017   COPD (chronic obstructive pulmonary disease) (HCC)    Depression    Diabetes (HCC) 01/08/2022   Diabetes mellitus without complication (HCC)    DVT (deep venous thrombosis) (HCC) 11/16/2016   Dysrhythmia    Esophageal dysmotility 11/10/2019   Previous workup by ENT with fiberoptic leryngoscopy which was normal. Dx with laryngeal pharyngeal reflux.   GERD (gastroesophageal reflux disease)    Headache    "weekly for the last 3 months" (04/17/2017)   History of pulmonary embolism    Pulmonary  embolism   Hyperlipidemia    Hypertension    Laryngopharyngeal reflux 12/18/2015   Morbid obesity (HCC)    PE (pulmonary embolism)    Pulmonary embolism (HCC) 09/21/2014   Sleep apnea    Stroke (HCC) 12/02/2021   Patient Active Problem List   Diagnosis Date Noted   CHF (congestive heart failure) (HCC) 10/28/2022   Enlarged thoracic aorta (HCC) 05/09/2022   Healthcare maintenance 05/09/2022   Foul smelling urine 05/04/2022   Anxiety attack 03/17/2022   Prediabetes 04/09/2021   Goals of care, counseling/discussion 03/23/2021   Malignant neoplasm of upper-outer quadrant of right breast in female, estrogen receptor positive (HCC) 03/21/2021   Right foot pain 03/03/2021   (HFpEF) heart failure with preserved ejection fraction (HCC)    Osteoarthritis of left hip 10/07/2020   Venous stasis ulcer of left lower extremity (HCC) 08/18/2020   Hyperlipidemia 05/14/2020   Chronic respiratory failure with hypoxia (HCC) 09/30/2019   Urinary incontinence 08/21/2019   GERD (gastroesophageal reflux disease) 01/20/2019   OSA (obstructive sleep apnea) 11/14/2017   Chronic pain syndrome 07/27/2017   Major depression, recurrent, chronic (HCC) 02/01/2017   Osteoporosis 12/14/2016   Aortic atherosclerosis (HCC) 07/17/2016   Chronic venous insufficiency 07/12/2016   Chronic anticoagulation 07/12/2016   Tobacco use 07/11/2016   COPD (chronic obstructive pulmonary disease) (HCC)    Atrial fibrillation with controlled ventricular response (HCC)    Vocal cord polyp 12/18/2015   Morbid obesity (HCC)    Peripheral vascular  disease (HCC)    Benign essential HTN    Primary osteoarthritis of both knees 09/16/2014   Home Medication(s) Prior to Admission medications   Medication Sig Start Date End Date Taking? Authorizing Provider  albuterol (VENTOLIN HFA) 108 (90 Base) MCG/ACT inhaler Inhale 2 puffs into the lungs every 4 (four) hours as needed for shortness of breath. 07/10/22   Atway, Rayann N, DO   alendronate (FOSAMAX) 70 MG tablet Take 1 tablet (70 mg total) by mouth every 7 (seven) days. Take with a full glass of water on an empty stomach. 10/25/22 10/25/23  Katheran James, DO  aspirin EC 81 MG tablet Take 81 mg by mouth daily. 12/15/20   [provider]  atorvastatin (LIPITOR) 80 MG tablet Take 1 tablet (80 mg total) by mouth daily. 10/09/22   Atway, Rayann N, DO  esomeprazole (NEXIUM) 40 MG capsule Take 1 capsule (40 mg total) by mouth daily as needed (for heartburn or indigestion). Patient taking differently: Take 40 mg by mouth daily. 01/20/19   Synetta Fail, MD  Fluticasone-Umeclidin-Vilant (TRELEGY ELLIPTA) 100-62.5-25 MCG/ACT AEPB Inhale 1 puff into the lungs daily. 05/17/22   Nooruddin, Jason Fila, MD  gabapentin (NEURONTIN) 400 MG capsule TAKE 1 CAPSULE BY MOUTH TWICE  DAILY 07/05/22   Atway, Rayann N, DO  ipratropium-albuterol (DUONEB) 0.5-2.5 (3) MG/3ML SOLN Take 3 mLs by nebulization every 6 (six) hours as needed. Patient taking differently: Take 3 mLs by nebulization every 6 (six) hours as needed (shortness of breath). 06/18/20   Glenford Bayley, NP  letrozole [facility]) 2.5 MG tablet TAKE 1 TABLET(2.5 MG) BY MOUTH DAILY 06/14/22   Serena Croissant, MD  losartan (COZAAR) 25 MG tablet Take 1 tablet (25 mg total) by mouth daily. 02/22/22 02/22/23  Modena Slater, DO  metoprolol succinate (TOPROL-XL) 25 MG 24 hr tablet TAKE 1 TABLET BY MOUTH DAILY 02/09/22   Merrilyn Puma, MD  MYRBETRIQ 50 MG TB24 tablet TAKE 1 TABLET BY MOUTH DAILY 01/25/22   Doran Stabler, DO  naloxone [facility]) nasal spray 4 mg/0.1 mL Place 1 spray into the nose once as needed (for overdose suspected). for opioid overdose 11/02/21   [provider]  oxybutynin (DITROPAN) 5 MG tablet Take 1 tablet (5 mg total) by mouth 3 (three) times daily. 10/17/22   Marrianne Mood, MD  oxyCODONE (ROXICODONE) 15 MG immediate release tablet Take 15 mg by mouth every 6 (six) hours as needed for pain. 08/05/20   [provider]  OXYGEN Inhale 2-3 L into the lungs daily.    [provider]  potassium chloride SA (KLOR-CON M) 20 MEQ tablet Take 1 tablet (20 mEq total) by mouth daily. 10/10/22   Marrianne Mood, MD  PRESCRIPTION MEDICATION Inhale into the lungs at bedtime. cpap    [provider]  rivaroxaban (XARELTO) 20 MG TABS tablet Take 1 tablet (20 mg total) by mouth every morning. 02/13/22   Merrilyn Puma, MD  Semaglutide-Weight Management [facility]) 1 MG/0.5ML SOAJ Inject 1 mg into the skin once a week. 10/19/22   Marrianne Mood, MD  torsemide (DEMADEX) 20 MG tablet Take 2 tablets by mouth daily, you may take an extra 1/2 tablet only as needed for weight gain of 2 lbs overnight or 5 lbs in a week 10/25/22   Katheran James, DO  Past Surgical History Past Surgical History:  Procedure Laterality Date   ABDOMINAL HYSTERECTOMY     APPENDECTOMY     BALLOON DILATION N/A 03/09/2020   Procedure: BALLOON DILATION;  Surgeon: Kathi Der, MD;  Location: [facility] ENDOSCOPY;  Service: Gastroenterology;  Laterality: N/A;   BIOPSY  03/09/2020   Procedure: BIOPSY;  Surgeon: Kathi Der, MD;  Location: [facility];  Service: Gastroenterology;;   BIOPSY  04/12/2021   Procedure: BIOPSY;  Surgeon: Kathi Der, MD;  Location: [facility];  Service: Gastroenterology;;   BREAST BIOPSY Right 03/11/2021   US biopsy/ coil clip/ path pending   BREAST BIOPSY Right 03/11/2021   US biopsy/ ribbone clip/ path pending   BREAST CYST EXCISION Left    "six o'clock"   BREAST LUMPECTOMY Left    BREAST LUMPECTOMY WITH RADIOACTIVE SEED LOCALIZATION Right 05/02/2021   Procedure: RIGHT BREAST LUMPECTOMY WITH RADIOACTIVE SEED LOCALIZATION;  Surgeon: Griselda Miner, MD;  Location: [facility] OR;  Service: General;  Laterality: Right;   CHOLECYSTECTOMY     DILATION AND CURETTAGE OF  UTERUS     ESOPHAGOGASTRODUODENOSCOPY (EGD) WITH PROPOFOL N/A 03/09/2020   Procedure: ESOPHAGOGASTRODUODENOSCOPY (EGD) WITH PROPOFOL;  Surgeon: Kathi Der, MD;  Location: [facility];  Service: Gastroenterology;  Laterality: N/A;   ESOPHAGOGASTRODUODENOSCOPY (EGD) WITH PROPOFOL N/A 04/12/2021   Procedure: ESOPHAGOGASTRODUODENOSCOPY (EGD) WITH PROPOFOL;  Surgeon: Kathi Der, MD;  Location: [facility];  Service: Gastroenterology;  Laterality: N/A;   IR ABLATE LIVER CRYOABLATION  07/23/2019   IR RADIOLOGIST EVAL & MGMT  07/18/2019   SAVORY DILATION N/A 04/12/2021   Procedure: SAVORY DILATION;  Surgeon: Kathi Der, MD;  Location: [facility];  Service: Gastroenterology;  Laterality: N/A;   TONSILLECTOMY AND ADENOIDECTOMY     TUBAL LIGATION     Family History Family History  Problem Relation Age of Onset   Breast cancer Mother        before age 28   Hypertension Mother    Hyperlipidemia Mother    Hypertension Maternal Grandfather    Hyperlipidemia Maternal Grandfather     Social History Social History   Tobacco Use   Smoking status: Former    Current packs/day: 0.00    Average packs/day: 2.0 packs/day for 60.4 years (120.8 ttl pk-yrs)    Types: Cigarettes    Start date: 08/16/1961    Quit date: 01/02/2022    Years since quitting: 0.8   Smokeless tobacco: Never   Tobacco comments:    Pt smokes about 4 ciggs daily. Stopped 01/02/2022   Vaping Use   Vaping status: Never Used  Substance Use Topics   Alcohol use: No   Drug use: No   Allergies Patient has no known allergies.  Review of Systems Review of Systems  Unable to perform ROS: Severe respiratory distress    Physical Exam Vital Signs  I have reviewed the triage vital signs BP (!) 156/110   Pulse 97   Temp 99.9 F (37.7 C)   Resp (!) 24   Ht 6\' 2"  (1.88 m)   Wt (!) 163.3 kg   SpO2 94%   BMI 46.22 kg/m  Physical Exam Vitals and nursing note reviewed. Exam conducted with a chaperone  present.  Constitutional:      General: She is in acute distress.     Appearance: She is obese. She is ill-appearing and diaphoretic.     Comments: Malodorous   HENT:     Head: Normocephalic and atraumatic.     Right Ear: External ear normal.  Left Ear: External ear normal.     Nose: Nose normal.     Mouth/Throat:     Mouth: Mucous membranes are moist.  Eyes:     General: No scleral icterus.       Right eye: No discharge.        Left eye: No discharge.     Extraocular Movements: Extraocular movements intact.     Pupils: Pupils are equal, round, and reactive to light.  Cardiovascular:     Rate and Rhythm: Tachycardia present. Rhythm irregular.     Pulses: Normal pulses.     Heart sounds: Normal heart sounds. No murmur heard. Pulmonary:     Effort: Tachypnea, accessory muscle usage and respiratory distress present.     Breath sounds: Decreased air movement present.     Comments: Rales b/l  Abdominal:     General: Abdomen is flat. There is no distension.     Palpations: Abdomen is soft.     Tenderness: There is no abdominal tenderness.  Musculoskeletal:     Cervical back: Normal range of motion.     Right lower leg: Edema present.     Left lower leg: Edema present.  Skin:    General: Skin is warm.     Capillary Refill: Capillary refill takes less than 2 seconds.     ED Results and Treatments Labs (all labs ordered are listed, but only abnormal results are displayed) Labs Reviewed  CBC WITH DIFFERENTIAL/PLATELET - Abnormal; Notable for the following components:      Result Value   WBC 12.7 (*)    RBC 5.36 (*)    Hemoglobin 15.1 (*)    HCT 48.2 (*)    Platelets 704 (*)    Lymphs Abs 6.5 (*)    All other components within normal limits  COMPREHENSIVE METABOLIC PANEL - Abnormal; Notable for the following components:   CO2 19 (*)    Glucose, Bld 267 (*)    Creatinine, Ser 1.16 (*)    GFR, Estimated 50 (*)    Anion gap 19 (*)    All other components within normal  limits  BRAIN NATRIURETIC PEPTIDE - Abnormal; Notable for the following components:   B Natriuretic Peptide 630.2 (*)    All other components within normal limits  PROTIME-INR - Abnormal; Notable for the following components:   Prothrombin Time 18.6 (*)    INR 1.5 (*)    All other components within normal limits  URINALYSIS, W/ REFLEX TO CULTURE (INFECTION SUSPECTED) - Abnormal; Notable for the following components:   APPearance HAZY (*)    Glucose, UA 50 (*)    Protein, ur >=300 (*)    Leukocytes,Ua SMALL (*)    Bacteria, UA MANY (*)    All other components within normal limits  I-STAT ARTERIAL BLOOD GAS, ED - Abnormal; Notable for the following components:   pH, Arterial 7.173 (*)    pCO2 arterial 63.2 (*)    pO2, Arterial 79 (*)    Acid-base deficit 6.0 (*)    All other components within normal limits  I-STAT CHEM 8, ED - Abnormal; Notable for the following components:   Creatinine, Ser 1.10 (*)    Glucose, Bld 261 (*)    Hemoglobin 15.6 (*)    All other components within normal limits  I-STAT CG4 LACTIC ACID, ED - Abnormal; Notable for the following components:   Lactic Acid, Venous 4.2 (*)    All other components within normal limits  TROPONIN I (HIGH  SENSITIVITY) - Abnormal; Notable for the following components:   Troponin I (High Sensitivity) 20 (*)    All other components within normal limits  RESP PANEL BY RT-PCR (RSV, FLU A&B, COVID)  RVPGX2  CULTURE, BLOOD (ROUTINE X 2)  CULTURE, BLOOD (ROUTINE X 2)  SARS CORONAVIRUS 2 BY RT PCR  RESPIRATORY PANEL BY PCR  URINE CULTURE  PROCALCITONIN  CBC  COMPREHENSIVE METABOLIC PANEL  MAGNESIUM  PHOSPHORUS  TRIGLYCERIDES  CBC  CREATININE, SERUM  I-STAT CG4 LACTIC ACID, ED  TROPONIN I (HIGH SENSITIVITY)                                                                                                                          Radiology DG Chest Portable 1 View  Result Date: 10/28/2022 CLINICAL DATA:  Intubated EXAM:  PORTABLE CHEST 1 VIEW COMPARISON:  10/28/2022 FINDINGS: Single frontal view of the chest demonstrates endotracheal tube overlying tracheal air column, tip at thoracic inlet. Enteric catheter passes below diaphragm tip excluded by collimation. Stable enlarged cardiac silhouette, with bilateral perihilar airspace disease compatible with edema. No large effusion or pneumothorax. No acute bony abnormalities. IMPRESSION: 1. No complication after intubation. 2. Congestive heart failure and marked pulmonary edema, unchanged. Electronically Signed   By: Sharlet Salina M.D.   On: 10/28/2022 22:41   DG Abdomen 1 View  Result Date: 10/28/2022 CLINICAL DATA:  OG tube placement EXAM: ABDOMEN - 1 VIEW COMPARISON:  None Available. FINDINGS: Enteric tube tip and side-port in the stomach IMPRESSION: Enteric tube tip and side-port in the stomach. Electronically Signed   By: Minerva Fester M.D.   On: 10/28/2022 22:41   DG Chest Portable 1 View  Result Date: 10/28/2022 CLINICAL DATA:  ETT placement EXAM: PORTABLE CHEST 1 VIEW COMPARISON:  Comparison is made with chest radiograph 10/05/2022 and subsequent chest radiograph on 10/28/2022 at 10:20 p.m. FINDINGS: Endotracheal tube tip in the mid intrathoracic trachea 4.5 cm from the carina. Subdiaphragmatic enteric tube. Patchy bilateral airspace and interstitial opacities greatest in the mid and lower lungs. Cardiomegaly. Probable pleural effusions. No definite pneumothorax. IMPRESSION: Endotracheal tube tip in the upper intrathoracic trachea. Cardiomegaly and pulmonary edema. Electronically Signed   By: Minerva Fester M.D.   On: 10/28/2022 22:41    Pertinent labs & imaging results that were available during my care of the patient were reviewed by me and considered in my medical decision making (see MDM for details).  Medications Ordered in ED Medications  nitroGLYCERIN 50 mg in dextrose 5 % 250 mL (0.2 mg/mL) infusion (0 mcg/min Intravenous Paused 10/28/22 2207)  etomidate  (AMIDATE) injection (10 mg Intravenous Given 10/28/22 2201)  fentaNYL in NS (60mcg/ml) infusion-PREMIX (100 mcg/hr Intravenous Rate/Dose Change 10/28/22 2323)  azithromycin (ZITHROMAX) 500 mg in sodium chloride 0.9 % 250 mL IVPB (500 mg Intravenous New Bag/Given 10/28/22 2345)  norepinephrine (LEVOPHED) 4mg  in (0.016 mg/mL) premix infusion (30 mcg/min Intravenous Rate/Dose Change 10/28/22 2342)  docusate (COLACE) 50 MG/5ML liquid  100 mg (has no administration in time range)  polyethylene glycol (MIRALAX / GLYCOLAX) packet 17 g (has no administration in time range)  propofol (DIPRIVAN) 1000 MG/100ML infusion (25 mcg/kg/min  163.3 kg Intravenous Rate/Dose Change 10/28/22 2347)  fentaNYL (SUBLIMAZE) injection 25 mcg (has no administration in time range)  fentaNYL (SUBLIMAZE) injection 25-100 mcg (has no administration in time range)  docusate (COLACE) 50 MG/5ML liquid 100 mg (has no administration in time range)  polyethylene glycol (MIRALAX / GLYCOLAX) packet 17 g (has no administration in time range)  heparin injection 5,000 Units (has no administration in time range)  methylPREDNISolone sodium succinate (SOLU-MEDROL) 125 mg/2 mL injection 125 mg (125 mg Intravenous Given 10/28/22 2227)  etomidate (AMIDATE) injection 30 mg (30 mg Intravenous Given 10/28/22 2200)  rocuronium bromide 10 mg/mL (PF) syringe (100 mg Intravenous Given 10/28/22 2202)  furosemide (LASIX) injection 80 mg (80 mg Intravenous Given 10/28/22 2227)  cefTRIAXone (ROCEPHIN) 2 g in sodium chloride 0.9 % 100 mL IVPB (0 g Intravenous Stopped 10/28/22 2342)                                                                                                                                     Procedures .Critical Care  Performed by: Sloan Leiter, DO Authorized by: Sloan Leiter, DO   Critical care provider statement:    Critical care time (minutes):  95   Critical care time was exclusive of:  Separately billable  procedures and treating other patients   Critical care was necessary to treat or prevent imminent or life-threatening deterioration of the following conditions:  Sepsis, respiratory failure and shock   Critical care was time spent personally by me on the following activities:  Development of treatment plan with patient or surrogate, discussions with consultants, evaluation of patient's response to treatment, examination of patient, ordering and review of laboratory studies, ordering and review of radiographic studies, ordering and performing treatments and interventions, pulse oximetry, re-evaluation of patient's condition, review of old charts and obtaining history from patient or surrogate   Care discussed with: admitting provider   Procedure Name: Intubation Date/Time: 10/28/2022 11:56 PM  Performed by: Sloan Leiter, DOPre-anesthesia Checklist: Patient identified, Emergency Drugs available, Suction available, Patient being monitored and Timeout performed Preoxygenation: Pre-oxygenation with 100% oxygen Induction Type: Rapid sequence Laryngoscope Size: Glidescope Grade View: Grade I Tube size: 7.5 mm Number of attempts: 1 Placement Confirmation: ETT inserted through vocal cords under direct vision, CO2 detector and Breath sounds checked- equal and bilateral Secured at: 23 cm Tube secured with: ETT holder Dental Injury: Teeth and Oropharynx as per pre-operative assessment  Difficulty Due To: Difficulty was anticipated Comments: ETT advanced after initial XR  copious secretions from posterior oropharynx at time of intubation        (including critical care time)  Medical Decision Making / ED Course    Medical Decision Making:    Flarence Exantus  Szeliga is a 72 y.o. female w/ hx as above including afib, CHF, COPD, DM here with resp distress. The complaint involves an extensive differential diagnosis and also carries with it a high risk of complications and morbidity.  Serious etiology was  considered. Ddx includes but is not limited to: In my evaluation of this patient's dyspnea my DDx includes, but is not limited to, pneumonia, pulmonary embolism, pneumothorax, pulmonary edema, metabolic acidosis, asthma, COPD, cardiac cause, anemia, anxiety, etc.    Complete initial physical exam performed, notably the patient  was acute resp distress, hypoxia on NRB at 74%, transition to BIPAP, will likely need intubation.    Reviewed and confirmed nursing documentation for past medical history, family history, social history.  Vital signs reviewed.    Clinical Course as of 10/28/22 2359  Sat Oct 28, 2022  2315 Bp dropping, hold sedation, start pressors levo peripheral [SG]  2315 Spoke with Dr Katrinka Blazing PCCM regarding admission  [SG]    Clinical Course User Index [SG] Tanda Rockers A, DO   Acute resp distress on arrival w/ hypoxia Elevated BP, LE edema, rales on exam, hypertensive SBP >180's Will trial bipap Start nitroglycerine gtt Pt with slight improvement with bipap initially but then less responsive Intubated for airway protection, resp failure Post ETT CXR with diffuse pulm edema Start lasix 80mg  IV  Cover with rocephin/azithro Send cultures UA w/ UTI, send ctx LA 4, post intubation pH 7.17, WBC 12.7, trop 20 (favor demand) Meets criteria for sepsis  Concern primarily for resp failure likely 2/2 pulm edema/CHF exacerbation/?SCAPE; PNA seems less likely at this point but will still cover with abx give acuity of condition Plan admit ICU Dr Katrinka Blazing accepting         Additional history obtained: -Additional history obtained from ems -External records from outside source obtained and reviewed including: Chart review including previous notes, labs, imaging, consultation notes including prior ED visits, prior labs/imaging/home meds   Lab Tests: -I ordered, reviewed, and interpreted labs.   The pertinent results include:   Labs Reviewed  CBC WITH DIFFERENTIAL/PLATELET -  Abnormal; Notable for the following components:      Result Value   WBC 12.7 (*)    RBC 5.36 (*)    Hemoglobin 15.1 (*)    HCT 48.2 (*)    Platelets 704 (*)    Lymphs Abs 6.5 (*)    All other components within normal limits  COMPREHENSIVE METABOLIC PANEL - Abnormal; Notable for the following components:   CO2 19 (*)    Glucose, Bld 267 (*)    Creatinine, Ser 1.16 (*)    GFR, Estimated 50 (*)    Anion gap 19 (*)    All other components within normal limits  BRAIN NATRIURETIC PEPTIDE - Abnormal; Notable for the following components:   B Natriuretic Peptide 630.2 (*)    All other components within normal limits  PROTIME-INR - Abnormal; Notable for the following components:   Prothrombin Time 18.6 (*)    INR 1.5 (*)    All other components within normal limits  URINALYSIS, W/ REFLEX TO CULTURE (INFECTION SUSPECTED) - Abnormal; Notable for the following components:   APPearance HAZY (*)    Glucose, UA 50 (*)    Protein, ur >=300 (*)    Leukocytes,Ua SMALL (*)    Bacteria, UA MANY (*)    All other components within normal limits  I-STAT ARTERIAL BLOOD GAS, ED - Abnormal; Notable for the following components:   pH, Arterial 7.173 (*)  pCO2 arterial 63.2 (*)    pO2, Arterial 79 (*)    Acid-base deficit 6.0 (*)    All other components within normal limits  I-STAT CHEM 8, ED - Abnormal; Notable for the following components:   Creatinine, Ser 1.10 (*)    Glucose, Bld 261 (*)    Hemoglobin 15.6 (*)    All other components within normal limits  I-STAT CG4 LACTIC ACID, ED - Abnormal; Notable for the following components:   Lactic Acid, Venous 4.2 (*)    All other components within normal limits  TROPONIN I (HIGH SENSITIVITY) - Abnormal; Notable for the following components:   Troponin I (High Sensitivity) 20 (*)    All other components within normal limits  RESP PANEL BY RT-PCR (RSV, FLU A&B, COVID)  RVPGX2  CULTURE, BLOOD (ROUTINE X 2)  CULTURE, BLOOD (ROUTINE X 2)  SARS  CORONAVIRUS 2 BY RT PCR  RESPIRATORY PANEL BY PCR  URINE CULTURE  PROCALCITONIN  CBC  COMPREHENSIVE METABOLIC PANEL  MAGNESIUM  PHOSPHORUS  TRIGLYCERIDES  CBC  CREATININE, SERUM  I-STAT CG4 LACTIC ACID, ED  TROPONIN I (HIGH SENSITIVITY)    Notable for as above  EKG   EKG Interpretation Date/Time:  Saturday October 28 2022 21:53:27 EDT Ventricular Rate:  167 PR Interval:  90 QRS Duration:  119 QT Interval:  304 QTC Calculation: 507 R Axis:   -52  Text Interpretation: Atrial fibrillation with rapid ventricular response Left anterior fascicular block LVH with secondary repolarization abnormality Anterior Q waves, possibly due to LVH When compared with ECG of EARLIER SAME DATE No significant change was found Confirmed by Dione Booze ([id number]) on 10/28/2022 11:03:35 PM         Imaging Studies ordered: I ordered imaging studies including CXR I independently visualized the following imaging with scope of interpretation limited to determining acute life threatening conditions related to emergency care; findings noted above, significant for pulm edema  I independently visualized and interpreted imaging. I agree with the radiologist interpretation   Medicines ordered and prescription drug management: Meds ordered this encounter  Medications   nitroGLYCERIN 50 mg in dextrose 5 % 250 mL (0.2 mg/mL) infusion   methylPREDNISolone sodium succinate (SOLU-MEDROL) 125 mg/2 mL injection 125 mg    IV methylprednisolone will be converted to either a q12h or q24h frequency with the same total daily dose (TDD).  Ordered Dose: 1 to 125 mg TDD; convert to: TDD q24h.  Ordered Dose: 126 to 250 mg TDD; convert to: TDD div q12h.  Ordered Dose: >250 mg TDD; DAW.   etomidate (AMIDATE) injection 30 mg   rocuronium bromide 10 mg/mL (PF) syringe   etomidate (AMIDATE) injection   DISCONTD: propofol (DIPRIVAN) 1000 MG/100ML infusion   DISCONTD: fentaNYL (SUBLIMAZE) injection 25 mcg   fentaNYL in  NS (76mcg/ml) infusion-PREMIX   DISCONTD: fentaNYL (SUBLIMAZE) bolus via infusion 25-100 mcg   furosemide (LASIX) injection 80 mg   cefTRIAXone (ROCEPHIN) 2 g in sodium chloride 0.9 % 100 mL IVPB    Order Specific Question:   Antibiotic Indication:    Answer:   CAP   azithromycin (ZITHROMAX) 500 mg in sodium chloride 0.9 % 250 mL IVPB   norepinephrine (LEVOPHED) 4mg  in (0.016 mg/mL) premix infusion   docusate (COLACE) 50 MG/5ML liquid 100 mg   polyethylene glycol (MIRALAX / GLYCOLAX) packet 17 g   propofol (DIPRIVAN) 1000 MG/100ML infusion   fentaNYL (SUBLIMAZE) injection 25 mcg   fentaNYL (SUBLIMAZE) injection 25-100 mcg   docusate (  COLACE) 50 MG/5ML liquid 100 mg   polyethylene glycol (MIRALAX / GLYCOLAX) packet 17 g   heparin injection 5,000 Units    -I have reviewed the patients home medicines and have made adjustments as needed   Consultations Obtained: I requested consultation with the PCCM,  and discussed lab and imaging findings as well as pertinent plan - they recommend: admit   Cardiac Monitoring: The patient was maintained on a cardiac monitor.  I personally viewed and interpreted the cardiac monitored which showed an underlying rhythm of: afib rvr  Social Determinants of Health:  Diagnosis or treatment significantly limited by social determinants of health: obesity   Reevaluation: After the interventions noted above, I reevaluated the patient and found that they have improved  Co morbidities that complicate the patient evaluation  Past Medical History:  Diagnosis Date   A-fib (HCC)    Acute hypoxemic respiratory failure (HCC) 02/20/2022   Acute on chronic hypoxic respiratory failure (HCC) 01/04/2022   Acute upper respiratory infection 01/08/2022   Anxiety    Arthritis    "qwhre; joints, back" (04/17/2017)   Atrial fibrillation (HCC)    Benign breast cyst in female, left 01/08/2017   Found by Screening mammogram, evaluated by [facility] on 01/08/17 and  determined to be a benign simple breast cyst.   Breast cancer (HCC)    Cellulitis of left lower leg 05/30/2017   CHF (congestive heart failure) (HCC)    Chronic low back pain 08/21/2016   Chronic lower back pain    Chronic venous insufficiency    /notes 05/30/2017   COPD (chronic obstructive pulmonary disease) (HCC)    Depression    Diabetes (HCC) 01/08/2022   Diabetes mellitus without complication (HCC)    DVT (deep venous thrombosis) (HCC) 11/16/2016   Dysrhythmia    Esophageal dysmotility 11/10/2019   Previous workup by ENT with fiberoptic leryngoscopy which was normal. Dx with laryngeal pharyngeal reflux.   GERD (gastroesophageal reflux disease)    Headache    "weekly for the last 3 months" (04/17/2017)   History of pulmonary embolism    Pulmonary embolism   Hyperlipidemia    Hypertension    Laryngopharyngeal reflux 12/18/2015   Morbid obesity (HCC)    PE (pulmonary embolism)    Pulmonary embolism (HCC) 09/21/2014   Sleep apnea    Stroke (HCC) 12/02/2021      Dispostion: Disposition decision including need for hospitalization was considered, and patient admitted to the hospital.    Final Clinical Impression(s) / ED Diagnoses Final diagnoses:  Acute respiratory failure with hypoxia (HCC)  Acute cystitis without hematuria  Acute pulmonary edema (HCC)     This chart was dictated using voice recognition software.  Despite best efforts to proofread,  errors can occur which can change the documentation meaning.    Sloan Leiter, DO 10/28/22 2359

## 2022-10-28 NOTE — H&P (Incomplete)
NAME:  Sharon Vasquez, [name]:  [id number], DOB:  Apr 07, 1950, LOS: 0 ADMISSION DATE:  10/28/2022, CONSULTATION DATE:  10/28/22 REFERRING MD:  EDP, CHIEF COMPLAINT:  SOB   History of Present Illness:  72 year old woman with a history of diastolic heart failure, COPD on home oxygen, prior VTE presenting with rather sudden onset of shortness of breath after a car trip today.  She arrived to the ER in extremis and was intubated for severe hypoxemia.  Family at bedside and states she is not having any recent infectious symptoms.  Pulmonary critical care is asked to admit for acute on chronic hypoxemic respiratory failure with abnormal x-ray.  Pertinent  Medical History  COPD,  Atrial fibrillation Prior DVT and PE on Xarelto Gastric reflux Chronic degenerative joint disease with chronic pain and opiate abuse  Significant Hospital Events: Including procedures, antibiotic start and stop dates in addition to other pertinent events   7/27 admit, ETT, CVL  Interim History / Subjective:  Admit  Objective   Blood pressure (!) 156/110, pulse 97, temperature 99.9 F (37.7 C), resp. rate (!) 24, height 6\' 2"  (1.88 m), weight (!) 163.3 kg, SpO2 94%.    Vent Mode: PRVC FiO2 (%):  [100 %] 100 % Set Rate:  [12 bmp-24 bmp] 24 bmp Vt Set:  [580 mL-620 mL] 620 mL PEEP:  [10 cmH20-12 cmH20] 12 cmH20 Plateau Pressure:  [31 cmH20] 31 cmH20  No intake or output data in the 24 hours ending 10/28/22 2355 Filed Weights   10/28/22 2149  Weight: (!) 163.3 kg    Examination: General: Obese woman in no acute distress on ventilator, starting to emerge from induction HENT: ETT in place with minimal secretions, no obvious JVD Lungs: Lung sounds are diminished at bases with crackles, passive on ventilator Cardiovascular: Heart sounds are regular tachycardic, atrial fibrillation on monitor Abdomen: Soft, positive bowel sounds Extremities: Chronic lymphedema Neuro: Starting to move around all 4 extremities as she  emergence of anesthesia GU: Foley with sterile urine  ABG, CBC, lactate, EKG, cxr all personally reviewed  Resolved Hospital Problem list   Not applicable  Assessment & Plan:  Acute on chronic hypoxemic respiratory failure-do not think VTE given compliance with blood thinner.  X-ray with pulmonary congestion pattern although suppose could be bilateral infection Baseline home oxygen dependence Morbid obesity without obvious OHS given normal serum bicarb in past BMP Initial hypertensive state now hypotensive state after induction Rule out sepsis Acute kidney injury  -   Best Practice (right click and "Reselect all SmartList Selections" daily)   Diet/type: {diet type:25684} DVT prophylaxis: {anticoagulation (Optional):25687} GI prophylaxis: {WU:13244} Lines: {Central Venous Access:25771} Foley:  {Central Venous Access:25691} Code Status:  {Code Status:26939} Last date of multidisciplinary goals of care discussion [***]  Labs   CBC: Recent Labs  Lab 10/28/22 2202 10/28/22 2210 10/28/22 2236  WBC  --  12.7*  --   NEUTROABS  --  5.7  --   HGB 15.6* 15.1* 12.9  HCT 46.0 48.2* 38.0  MCV  --  89.9  --   PLT  --  704*  --     Basic Metabolic Panel: Recent Labs  Lab 10/28/22 2202 10/28/22 2210 10/28/22 2236  NA 142 141 142  K 3.9 3.8 3.7  CL 107 103  --   CO2  --  19*  --   GLUCOSE 261* 267*  --   BUN 15 13  --   CREATININE 1.10* 1.16*  --   CALCIUM  --  9.2  --    GFR: Estimated Creatinine Clearance: 76.3 mL/min (A) (by C-G formula based on SCr of 1.16 mg/dL (H)). Recent Labs  Lab 10/28/22 2210 10/28/22 2238  WBC 12.7*  --   LATICACIDVEN  --  4.2*    Liver Function Tests: Recent Labs  Lab 10/28/22 2210  AST 26  ALT 21  ALKPHOS 112  BILITOT 0.4  PROT 7.6  ALBUMIN 3.8   No results for input(s): "LIPASE", "AMYLASE" in the last 168 hours. No results for input(s): "AMMONIA" in the last 168 hours.  ABG    Component Value Date/Time   PHART 7.173  (LL) 10/28/2022 2236   PCO2ART 63.2 (H) 10/28/2022 2236   PO2ART 79 (L) 10/28/2022 2236   HCO3 23.2 10/28/2022 2236   TCO2 25 10/28/2022 2236   ACIDBASEDEF 6.0 (H) 10/28/2022 2236   O2SAT 91 10/28/2022 2236     Coagulation Profile: Recent Labs  Lab 10/28/22 2259  INR 1.5*    Cardiac Enzymes: No results for input(s): "CKTOTAL", "CKMB", "CKMBINDEX", "TROPONINI" in the last 168 hours.  HbA1C: Hemoglobin A1C  Date/Time Value Ref Range Status  12/02/2021 08:59 AM 5.8 (A) 4.0 - 5.6 % Final  08/25/2021 09:11 AM 6.2 (A) 4.0 - 5.6 % Final   Hgb A1c MFr Bld  Date/Time Value Ref Range Status  04/27/2022 04:07 AM 5.6 4.8 - 5.6 % Final    Comment:    (NOTE) Pre diabetes:          5.7%-6.4%  Diabetes:              >6.4%  Glycemic control for   <7.0% adults with diabetes   07/19/2017 10:17 AM 5.9 (H) 4.8 - 5.6 % Final    Comment:             Prediabetes: 5.7 - 6.4          Diabetes: >6.4          Glycemic control for adults with diabetes: <7.0     CBG: No results for input(s): "GLUCAP" in the last 168 hours.  Review of Systems:   ***  Past Medical History:  She,  has a past medical history of A-fib (HCC), Acute hypoxemic respiratory failure (HCC) (02/20/2022), Acute on chronic hypoxic respiratory failure (HCC) (01/04/2022), Acute upper respiratory infection (01/08/2022), Anxiety, Arthritis, Atrial fibrillation (HCC), Benign breast cyst in female, left (01/08/2017), Breast cancer (HCC), Cellulitis of left lower leg (05/30/2017), CHF (congestive heart failure) (HCC), Chronic low back pain (08/21/2016), Chronic lower back pain, Chronic venous insufficiency, COPD (chronic obstructive pulmonary disease) (HCC), Depression, Diabetes (HCC) (01/08/2022), Diabetes mellitus without complication (HCC), DVT (deep venous thrombosis) (HCC) (11/16/2016), Dysrhythmia, Esophageal dysmotility (11/10/2019), GERD (gastroesophageal reflux disease), Headache, History of pulmonary embolism,  Hyperlipidemia, Hypertension, Laryngopharyngeal reflux (12/18/2015), Morbid obesity (HCC), PE (pulmonary embolism), Pulmonary embolism (HCC) (09/21/2014), Sleep apnea, and Stroke (HCC) (12/02/2021).   Surgical History:   Past Surgical History:  Procedure Laterality Date  . ABDOMINAL HYSTERECTOMY    . APPENDECTOMY    . BALLOON DILATION N/A 03/09/2020   Procedure: BALLOON DILATION;  Surgeon: Kathi Der, MD;  Location: [facility];  Service: Gastroenterology;  Laterality: N/A;  . BIOPSY  03/09/2020   Procedure: BIOPSY;  Surgeon: Kathi Der, MD;  Location: [facility];  Service: Gastroenterology;;  . BIOPSY  04/12/2021   Procedure: BIOPSY;  Surgeon: Kathi Der, MD;  Location: [facility];  Service: Gastroenterology;;  . BREAST BIOPSY Right 03/11/2021   US biopsy/ coil clip/ path pending  .  BREAST BIOPSY Right 03/11/2021   US biopsy/ ribbone clip/ path pending  . BREAST CYST EXCISION Left    "six o'clock"  . BREAST LUMPECTOMY Left   . BREAST LUMPECTOMY WITH RADIOACTIVE SEED LOCALIZATION Right 05/02/2021   Procedure: RIGHT BREAST LUMPECTOMY WITH RADIOACTIVE SEED LOCALIZATION;  Surgeon: Griselda Miner, MD;  [facility];  Service: General;  Laterality: Right;  . CHOLECYSTECTOMY    . DILATION AND CURETTAGE OF UTERUS    . ESOPHAGOGASTRODUODENOSCOPY (EGD) WITH PROPOFOL N/A 03/09/2020   Procedure: ESOPHAGOGASTRODUODENOSCOPY (EGD) WITH PROPOFOL;  Surgeon: Kathi Der, MD;  Location: [facility];  Service: Gastroenterology;  Laterality: N/A;  . ESOPHAGOGASTRODUODENOSCOPY (EGD) WITH PROPOFOL N/A 04/12/2021   Procedure: ESOPHAGOGASTRODUODENOSCOPY (EGD) WITH PROPOFOL;  Surgeon: Kathi Der, MD;  Location: [facility];  Service: Gastroenterology;  Laterality: N/A;  . IR ABLATE LIVER CRYOABLATION  07/23/2019  . IR RADIOLOGIST EVAL & MGMT  07/18/2019  . SAVORY DILATION N/A 04/12/2021   Procedure: SAVORY DILATION;  Surgeon: Kathi Der, MD;  Location: [facility];  Service: Gastroenterology;  Laterality: N/A;  . TONSILLECTOMY AND ADENOIDECTOMY    . TUBAL LIGATION       Social History:   reports that she quit smoking about 9 months ago. Her smoking use included cigarettes. She started smoking about 61 years ago. She has a 120.8 pack-year smoking history. She has never used smokeless tobacco. She reports that she does not drink alcohol and does not use drugs.   Family History:  Her family history includes Breast cancer in her mother; Hyperlipidemia in her maternal grandfather and mother; Hypertension in her maternal grandfather and mother.   Allergies No Known Allergies   Home Medications  Prior to Admission medications   Medication Sig Start Date End Date Taking? Authorizing Provider  albuterol (VENTOLIN HFA) 108 (90 Base) MCG/ACT inhaler Inhale 2 puffs into the lungs every 4 (four) hours as needed for shortness of breath. 07/10/22   Atway, Rayann N, DO  alendronate (FOSAMAX) 70 MG tablet Take 1 tablet (70 mg total) by mouth every 7 (seven) days. Take with a full glass of water on an empty stomach. 10/25/22 10/25/23  Katheran James, DO  aspirin EC 81 MG tablet Take 81 mg by mouth daily. 12/15/20   [provider]  atorvastatin (LIPITOR) 80 MG tablet Take 1 tablet (80 mg total) by mouth daily. 10/09/22   Atway, Rayann N, DO  esomeprazole (NEXIUM) 40 MG capsule Take 1 capsule (40 mg total) by mouth daily as needed (for heartburn or indigestion). Patient taking differently: Take 40 mg by mouth daily. 01/20/19   Synetta Fail, MD  Fluticasone-Umeclidin-Vilant (TRELEGY ELLIPTA) 100-62.5-25 MCG/ACT AEPB Inhale 1 puff into the lungs daily. 05/17/22   Nooruddin, Jason Fila, MD  gabapentin (NEURONTIN) 400 MG capsule TAKE 1 CAPSULE BY MOUTH TWICE  DAILY 07/05/22   Atway, Rayann N, DO  ipratropium-albuterol (DUONEB) 0.5-2.5 (3) MG/3ML SOLN Take 3 mLs by nebulization every 6 (six) hours as needed. Patient taking differently: Take 3 mLs by  nebulization every 6 (six) hours as needed (shortness of breath). 06/18/20   Glenford Bayley, NP  letrozole [facility]) 2.5 MG tablet TAKE 1 TABLET(2.5 MG) BY MOUTH DAILY 06/14/22   Serena Croissant, MD  losartan (COZAAR) 25 MG tablet Take 1 tablet (25 mg total) by mouth daily. 02/22/22 02/22/23  Modena Slater, DO  metoprolol succinate (TOPROL-XL) 25 MG 24 hr tablet TAKE 1 TABLET BY MOUTH DAILY 02/09/22   Merrilyn Puma, MD  MYRBETRIQ 50 MG TB24 tablet  TAKE 1 TABLET BY MOUTH DAILY 01/25/22   Doran Stabler, DO  naloxone [facility]) nasal spray 4 mg/0.1 mL Place 1 spray into the nose once as needed (for overdose suspected). for opioid overdose 11/02/21   [provider]  oxybutynin (DITROPAN) 5 MG tablet Take 1 tablet (5 mg total) by mouth 3 (three) times daily. 10/17/22   Marrianne Mood, MD  oxyCODONE (ROXICODONE) 15 MG immediate release tablet Take 15 mg by mouth every 6 (six) hours as needed for pain. 08/05/20   [provider]  OXYGEN Inhale 2-3 L into the lungs daily.    [provider]  potassium chloride SA (KLOR-CON M) 20 MEQ tablet Take 1 tablet (20 mEq total) by mouth daily. 10/10/22   Marrianne Mood, MD  PRESCRIPTION MEDICATION Inhale into the lungs at bedtime. cpap    [provider]  rivaroxaban (XARELTO) 20 MG TABS tablet Take 1 tablet (20 mg total) by mouth every morning. 02/13/22   Merrilyn Puma, MD  Semaglutide-Weight Management [facility]) 1 MG/0.5ML SOAJ Inject 1 mg into the skin once a week. 10/19/22   Marrianne Mood, MD  torsemide (DEMADEX) 20 MG tablet Take 2 tablets by mouth daily, you may take an extra 1/2 tablet only as needed for weight gain of 2 lbs overnight or 5 lbs in a week 10/25/22   Katheran James, DO     Critical care time: ***

## 2022-10-28 NOTE — H&P (Addendum)
NAME:  Sharon Vasquez, [name]:  [id number], DOB:  July 29, 1950, LOS: 0 ADMISSION DATE:  10/28/2022, CONSULTATION DATE:  10/28/22 REFERRING MD:  EDP, CHIEF COMPLAINT:  SOB   History of Present Illness:  72 year old woman with a history of diastolic heart failure, COPD on home oxygen, prior VTE presenting with rather sudden onset of shortness of breath after a car trip today.  She arrived to the ER in extremis and was intubated for severe hypoxemia.  Family at bedside and states she is not having any recent infectious symptoms.  Pulmonary critical care is asked to admit for acute on chronic hypoxemic respiratory failure with abnormal x-ray.  Pertinent  Medical History  COPD,  Atrial fibrillation Prior DVT and PE on Xarelto Gastric reflux Chronic degenerative joint disease with chronic pain and opiate abuse  Significant Hospital Events: Including procedures, antibiotic start and stop dates in addition to other pertinent events   7/27 admit, ETT, CVL  Interim History / Subjective:  Admit  Objective   Blood pressure (!) 156/110, pulse 97, temperature 99.9 F (37.7 C), resp. rate (!) 24, height 6\' 2"  (1.88 m), weight (!) 163.3 kg, SpO2 94%.    Vent Mode: PRVC FiO2 (%):  [100 %] 100 % Set Rate:  [12 bmp-24 bmp] 24 bmp Vt Set:  [580 mL-620 mL] 620 mL PEEP:  [10 cmH20-12 cmH20] 12 cmH20 Plateau Pressure:  [31 cmH20] 31 cmH20  No intake or output data in the 24 hours ending 10/28/22 2355 Filed Weights   10/28/22 2149  Weight: (!) 163.3 kg    Examination: General: Obese woman in no acute distress on ventilator, starting to emerge from induction HENT: ETT in place with minimal secretions, no obvious JVD Lungs: Lung sounds are diminished at bases with crackles, passive on ventilator Cardiovascular: Heart sounds are regular tachycardic, atrial fibrillation on monitor Abdomen: Soft, positive bowel sounds Extremities: Chronic lymphedema Neuro: Starting to move around all 4 extremities as she  emergence of anesthesia GU: Foley with sterile urine  ABG, CBC, lactate, EKG, cxr all personally reviewed  Resolved Hospital Problem list   Not applicable  Assessment & Plan:  Acute on chronic hypoxemic respiratory failure-do not think VTE given compliance with blood thinner.  X-ray with pulmonary congestion pattern although suppose could be bilateral infection Acute hypercarbia- vent adjusted by RT, repeat gas pending Baseline home oxygen dependence Morbid obesity without obvious OHS given normal serum bicarb in past BMP Initial hypertensive state now hypotensive state after induction Rule out sepsis Acute kidney injury Afib/RVR- initial trop neg DM2  - Given lasix 80 in ER, check CVP, track urine response, renal US - Check echo, amio no bolus - Xarelto to lovenox to start this evening - Check Pct, Urine Strep/legionella, RVP, covid - Ceftriaxone/azithromycin - Vent bundle, prop/fent for sedation RASS target -1 to -2 - Triple therapy nebs, no role for steroids - Levophed PRN MAP 65  Best Practice (right click and "Reselect all SmartList Selections" daily)   Diet/type: NPO DVT prophylaxis: systemic dose LMWH GI prophylaxis: PPI Lines: yes and it is still needed Foley:  Yes, and it is still needed Code Status:  full code Last date of multidisciplinary goals of care discussion [updated sister and daughter at bedside]  Labs   CBC: Recent Labs  Lab 10/28/22 2202 10/28/22 2210 10/28/22 2236  WBC  --  12.7*  --   NEUTROABS  --  5.7  --   HGB 15.6* 15.1* 12.9  HCT 46.0 48.2* 38.0  MCV  --  89.9  --   PLT  --  704*  --     Basic Metabolic Panel: Recent Labs  Lab 10/28/22 2202 10/28/22 2210 10/28/22 2236  NA 142 141 142  K 3.9 3.8 3.7  CL 107 103  --   CO2  --  19*  --   GLUCOSE 261* 267*  --   BUN 15 13  --   CREATININE 1.10* 1.16*  --   CALCIUM  --  9.2  --    GFR: Estimated Creatinine Clearance: 76.3 mL/min (A) (by C-G formula based on SCr of 1.16 mg/dL  (H)). Recent Labs  Lab 10/28/22 2210 10/28/22 2238  WBC 12.7*  --   LATICACIDVEN  --  4.2*    Liver Function Tests: Recent Labs  Lab 10/28/22 2210  AST 26  ALT 21  ALKPHOS 112  BILITOT 0.4  PROT 7.6  ALBUMIN 3.8   No results for input(s): "LIPASE", "AMYLASE" in the last 168 hours. No results for input(s): "AMMONIA" in the last 168 hours.  ABG    Component Value Date/Time   PHART 7.173 (LL) 10/28/2022 2236   PCO2ART 63.2 (H) 10/28/2022 2236   PO2ART 79 (L) 10/28/2022 2236   HCO3 23.2 10/28/2022 2236   TCO2 25 10/28/2022 2236   ACIDBASEDEF 6.0 (H) 10/28/2022 2236   O2SAT 91 10/28/2022 2236     Coagulation Profile: Recent Labs  Lab 10/28/22 2259  INR 1.5*    Cardiac Enzymes: No results for input(s): "CKTOTAL", "CKMB", "CKMBINDEX", "TROPONINI" in the last 168 hours.  HbA1C: Hemoglobin A1C  Date/Time Value Ref Range Status  12/02/2021 08:59 AM 5.8 (A) 4.0 - 5.6 % Final  08/25/2021 09:11 AM 6.2 (A) 4.0 - 5.6 % Final   Hgb A1c MFr Bld  Date/Time Value Ref Range Status  04/27/2022 04:07 AM 5.6 4.8 - 5.6 % Final    Comment:    (NOTE) Pre diabetes:          5.7%-6.4%  Diabetes:              >6.4%  Glycemic control for   <7.0% adults with diabetes   07/19/2017 10:17 AM 5.9 (H) 4.8 - 5.6 % Final    Comment:             Prediabetes: 5.7 - 6.4          Diabetes: >6.4          Glycemic control for adults with diabetes: <7.0     CBG: No results for input(s): "GLUCAP" in the last 168 hours.  Review of Systems:   Intubated/sedated  Past Medical History:  She,  has a past medical history of A-fib (HCC), Acute hypoxemic respiratory failure (HCC) (02/20/2022), Acute on chronic hypoxic respiratory failure (HCC) (01/04/2022), Acute upper respiratory infection (01/08/2022), Anxiety, Arthritis, Atrial fibrillation (HCC), Benign breast cyst in female, left (01/08/2017), Breast cancer (HCC), Cellulitis of left lower leg (05/30/2017), CHF (congestive heart failure)  (HCC), Chronic low back pain (08/21/2016), Chronic lower back pain, Chronic venous insufficiency, COPD (chronic obstructive pulmonary disease) (HCC), Depression, Diabetes (HCC) (01/08/2022), Diabetes mellitus without complication (HCC), DVT (deep venous thrombosis) (HCC) (11/16/2016), Dysrhythmia, Esophageal dysmotility (11/10/2019), GERD (gastroesophageal reflux disease), Headache, History of pulmonary embolism, Hyperlipidemia, Hypertension, Laryngopharyngeal reflux (12/18/2015), Morbid obesity (HCC), PE (pulmonary embolism), Pulmonary embolism (HCC) (09/21/2014), Sleep apnea, and Stroke (HCC) (12/02/2021).   Surgical History:   Past Surgical History:  Procedure Laterality Date   ABDOMINAL HYSTERECTOMY     APPENDECTOMY  BALLOON DILATION N/A 03/09/2020   Procedure: BALLOON DILATION;  Surgeon: Kathi Der, MD;  Location: [facility];  Service: Gastroenterology;  Laterality: N/A;   BIOPSY  03/09/2020   Procedure: BIOPSY;  Surgeon: Kathi Der, MD;  Location: [facility];  Service: Gastroenterology;;   BIOPSY  04/12/2021   Procedure: BIOPSY;  Surgeon: Kathi Der, MD;  Location: [facility];  Service: Gastroenterology;;   BREAST BIOPSY Right 03/11/2021   US biopsy/ coil clip/ path pending   BREAST BIOPSY Right 03/11/2021   US biopsy/ ribbone clip/ path pending   BREAST CYST EXCISION Left    "six o'clock"   BREAST LUMPECTOMY Left    BREAST LUMPECTOMY WITH RADIOACTIVE SEED LOCALIZATION Right 05/02/2021   Procedure: RIGHT BREAST LUMPECTOMY WITH RADIOACTIVE SEED LOCALIZATION;  Surgeon: Griselda Miner, MD;  Location: [facility];  Service: General;  Laterality: Right;   CHOLECYSTECTOMY     DILATION AND CURETTAGE OF UTERUS     ESOPHAGOGASTRODUODENOSCOPY (EGD) WITH PROPOFOL N/A 03/09/2020   Procedure: ESOPHAGOGASTRODUODENOSCOPY (EGD) WITH PROPOFOL;  Surgeon: Kathi Der, MD;  Location: [facility];  Service: Gastroenterology;  Laterality: N/A;   ESOPHAGOGASTRODUODENOSCOPY  (EGD) WITH PROPOFOL N/A 04/12/2021   Procedure: ESOPHAGOGASTRODUODENOSCOPY (EGD) WITH PROPOFOL;  Surgeon: Kathi Der, MD;  Location: [facility];  Service: Gastroenterology;  Laterality: N/A;   IR ABLATE LIVER CRYOABLATION  07/23/2019   IR RADIOLOGIST EVAL & MGMT  07/18/2019   SAVORY DILATION N/A 04/12/2021   Procedure: SAVORY DILATION;  Surgeon: Kathi Der, MD;  Location: [facility];  Service: Gastroenterology;  Laterality: N/A;   TONSILLECTOMY AND ADENOIDECTOMY     TUBAL LIGATION       Social History:   reports that she quit smoking about 9 months ago. Her smoking use included cigarettes. She started smoking about 61 years ago. She has a 120.8 pack-year smoking history. She has never used smokeless tobacco. She reports that she does not drink alcohol and does not use drugs.   Family History:  Her family history includes Breast cancer in her mother; Hyperlipidemia in her maternal grandfather and mother; Hypertension in her maternal grandfather and mother.   Allergies No Known Allergies   Home Medications  Prior to Admission medications   Medication Sig Start Date End Date Taking? Authorizing Provider  albuterol (VENTOLIN HFA) 108 (90 Base) MCG/ACT inhaler Inhale 2 puffs into the lungs every 4 (four) hours as needed for shortness of breath. 07/10/22   Atway, Rayann N, DO  alendronate (FOSAMAX) 70 MG tablet Take 1 tablet (70 mg total) by mouth every 7 (seven) days. Take with a full glass of water on an empty stomach. 10/25/22 10/25/23  Katheran James, DO  aspirin EC 81 MG tablet Take 81 mg by mouth daily. 12/15/20   [provider]  atorvastatin (LIPITOR) 80 MG tablet Take 1 tablet (80 mg total) by mouth daily. 10/09/22   Atway, Rayann N, DO  esomeprazole (NEXIUM) 40 MG capsule Take 1 capsule (40 mg total) by mouth daily as needed (for heartburn or indigestion). Patient taking differently: Take 40 mg by mouth daily. 01/20/19   Synetta Fail, MD   Fluticasone-Umeclidin-Vilant (TRELEGY ELLIPTA) 100-62.5-25 MCG/ACT AEPB Inhale 1 puff into the lungs daily. 05/17/22   Nooruddin, Jason Fila, MD  gabapentin (NEURONTIN) 400 MG capsule TAKE 1 CAPSULE BY MOUTH TWICE  DAILY 07/05/22   Atway, Rayann N, DO  ipratropium-albuterol (DUONEB) 0.5-2.5 (3) MG/3ML SOLN Take 3 mLs by nebulization every 6 (six) hours as needed. Patient taking differently: Take 3 mLs by nebulization every  6 (six) hours as needed (shortness of breath). 06/18/20   Glenford Bayley, NP  letrozole [facility]) 2.5 MG tablet TAKE 1 TABLET(2.5 MG) BY MOUTH DAILY 06/14/22   Serena Croissant, MD  losartan (COZAAR) 25 MG tablet Take 1 tablet (25 mg total) by mouth daily. 02/22/22 02/22/23  Modena Slater, DO  metoprolol succinate (TOPROL-XL) 25 MG 24 hr tablet TAKE 1 TABLET BY MOUTH DAILY 02/09/22   Merrilyn Puma, MD  MYRBETRIQ 50 MG TB24 tablet TAKE 1 TABLET BY MOUTH DAILY 01/25/22   Doran Stabler, DO  naloxone [facility]) nasal spray 4 mg/0.1 mL Place 1 spray into the nose once as needed (for overdose suspected). for opioid overdose 11/02/21   [provider]  oxybutynin (DITROPAN) 5 MG tablet Take 1 tablet (5 mg total) by mouth 3 (three) times daily. 10/17/22   Marrianne Mood, MD  oxyCODONE (ROXICODONE) 15 MG immediate release tablet Take 15 mg by mouth every 6 (six) hours as needed for pain. 08/05/20   [provider]  OXYGEN Inhale 2-3 L into the lungs daily.    [provider]  potassium chloride SA (KLOR-CON M) 20 MEQ tablet Take 1 tablet (20 mEq total) by mouth daily. 10/10/22   Marrianne Mood, MD  PRESCRIPTION MEDICATION Inhale into the lungs at bedtime. cpap    [provider]  rivaroxaban (XARELTO) 20 MG TABS tablet Take 1 tablet (20 mg total) by mouth every morning. 02/13/22   Merrilyn Puma, MD  Semaglutide-Weight Management [facility]) 1 MG/0.5ML SOAJ Inject 1 mg into the skin once a week. 10/19/22   Marrianne Mood, MD  torsemide (DEMADEX) 20 MG tablet Take 2  tablets by mouth daily, you may take an extra 1/2 tablet only as needed for weight gain of 2 lbs overnight or 5 lbs in a week 10/25/22   Katheran James, DO     Critical care time: 38 mins independent of procedures

## 2022-10-29 ENCOUNTER — Inpatient Hospital Stay (HOSPITAL_COMMUNITY): Payer: Medicare HMO

## 2022-10-29 DIAGNOSIS — J9621 Acute and chronic respiratory failure with hypoxia: Secondary | ICD-10-CM | POA: Diagnosis not present

## 2022-10-29 DIAGNOSIS — J810 Acute pulmonary edema: Secondary | ICD-10-CM | POA: Diagnosis not present

## 2022-10-29 DIAGNOSIS — J9601 Acute respiratory failure with hypoxia: Secondary | ICD-10-CM

## 2022-10-29 DIAGNOSIS — I509 Heart failure, unspecified: Secondary | ICD-10-CM | POA: Diagnosis not present

## 2022-10-29 DIAGNOSIS — J9622 Acute and chronic respiratory failure with hypercapnia: Secondary | ICD-10-CM | POA: Diagnosis not present

## 2022-10-29 DIAGNOSIS — I952 Hypotension due to drugs: Secondary | ICD-10-CM

## 2022-10-29 LAB — MAGNESIUM: Magnesium: 2.3 mg/dL (ref 1.7–2.4)

## 2022-10-29 LAB — MRSA NEXT GEN BY PCR, NASAL: MRSA by PCR Next Gen: POSITIVE — AB

## 2022-10-29 LAB — ECHOCARDIOGRAM COMPLETE
Area-P 1/2: 5.66 cm2
Est EF: <20
Height: 74 in
MV M vel: 2.41 m/s
MV Peak grad: 23.2 mmHg
S' Lateral: 5.6 cm
Weight: 5873.05 [oz_av]

## 2022-10-29 LAB — CBC
HCT: 40.4 % (ref 36.0–46.0)
HCT: 41.6 % (ref 36.0–46.0)
Hemoglobin: 12.8 g/dL (ref 12.0–15.0)
Hemoglobin: 14 g/dL (ref 12.0–15.0)
MCH: 27.4 pg (ref 26.0–34.0)
MCH: 28.9 pg (ref 26.0–34.0)
MCHC: 31.7 g/dL (ref 30.0–36.0)
MCHC: 33.7 g/dL (ref 30.0–36.0)
MCV: 85.8 fL (ref 80.0–100.0)
MCV: 86.5 fL (ref 80.0–100.0)
Platelets: 676 10*3/uL — ABNORMAL HIGH (ref 150–400)
Platelets: 733 10*3/uL — ABNORMAL HIGH (ref 150–400)
RBC: 4.67 MIL/uL (ref 3.87–5.11)
RBC: 4.85 MIL/uL (ref 3.87–5.11)
RDW: 13.9 % (ref 11.5–15.5)
RDW: 13.9 % (ref 11.5–15.5)
WBC: 13.1 10*3/uL — ABNORMAL HIGH (ref 4.0–10.5)
WBC: 14.8 10*3/uL — ABNORMAL HIGH (ref 4.0–10.5)
nRBC: 0 % (ref 0.0–0.2)
nRBC: 0 % (ref 0.0–0.2)

## 2022-10-29 LAB — COMPREHENSIVE METABOLIC PANEL WITH GFR
ALT: 29 U/L (ref 0–44)
AST: 40 U/L (ref 15–41)
Albumin: 3.5 g/dL (ref 3.5–5.0)
Alkaline Phosphatase: 111 U/L (ref 38–126)
Anion gap: 13 (ref 5–15)
BUN: 15 mg/dL (ref 8–23)
CO2: 21 mmol/L — ABNORMAL LOW (ref 22–32)
Calcium: 8.5 mg/dL — ABNORMAL LOW (ref 8.9–10.3)
Chloride: 103 mmol/L (ref 98–111)
Creatinine, Ser: 1.4 mg/dL — ABNORMAL HIGH (ref 0.44–1.00)
GFR, Estimated: 40 mL/min — ABNORMAL LOW (ref >60)
Glucose, Bld: 239 mg/dL — ABNORMAL HIGH (ref 70–99)
Potassium: 4.3 mmol/L (ref 3.5–5.1)
Sodium: 137 mmol/L (ref 135–145)
Total Bilirubin: 1.2 mg/dL (ref 0.3–1.2)
Total Protein: 7 g/dL (ref 6.5–8.1)

## 2022-10-29 LAB — RESPIRATORY PANEL BY PCR

## 2022-10-29 LAB — I-STAT ARTERIAL BLOOD GAS, ED
Acid-base deficit: 4 mmol/L — ABNORMAL HIGH (ref 0.0–2.0)
Bicarbonate: 21.2 mmol/L (ref 20.0–28.0)
Calcium, Ion: 1.15 mmol/L (ref 1.15–1.40)
HCT: 39 % (ref 36.0–46.0)
Hemoglobin: 13.3 g/dL (ref 12.0–15.0)
O2 Saturation: 99 %
Patient temperature: 98.6
Potassium: 4.1 mmol/L (ref 3.5–5.1)
Sodium: 141 mmol/L (ref 135–145)
TCO2: 22 mmol/L (ref 22–32)
pCO2 arterial: 40.5 mmHg (ref 32–48)
pH, Arterial: 7.327 — ABNORMAL LOW (ref 7.35–7.45)
pO2, Arterial: 178 mmHg — ABNORMAL HIGH (ref 83–108)

## 2022-10-29 LAB — PHOSPHORUS: Phosphorus: 4.5 mg/dL (ref 2.5–4.6)

## 2022-10-29 LAB — GLUCOSE, CAPILLARY
Glucose-Capillary: 198 mg/dL — ABNORMAL HIGH (ref 70–99)
Glucose-Capillary: 194 mg/dL — ABNORMAL HIGH (ref 70–99)
Glucose-Capillary: 145 mg/dL — ABNORMAL HIGH (ref 70–99)
Glucose-Capillary: 117 mg/dL — ABNORMAL HIGH (ref 70–99)

## 2022-10-29 LAB — RESP PANEL BY RT-PCR (RSV, FLU A&B, COVID)  RVPGX2
Influenza A by PCR: NEGATIVE
Influenza B by PCR: NEGATIVE
Resp Syncytial Virus by PCR: NEGATIVE
SARS Coronavirus 2 by RT PCR: NEGATIVE

## 2022-10-29 LAB — TRIGLYCERIDES: Triglycerides: 170 mg/dL — ABNORMAL HIGH (ref <150)

## 2022-10-29 LAB — TROPONIN I (HIGH SENSITIVITY): Troponin I (High Sensitivity): 140 ng/L (ref <18)

## 2022-10-29 LAB — I-STAT CG4 LACTIC ACID, ED: Lactic Acid, Venous: 2.4 mmol/L (ref 0.5–1.9)

## 2022-10-29 LAB — STREP PNEUMONIAE URINARY ANTIGEN: Strep Pneumo Urinary Antigen: NEGATIVE

## 2022-10-29 LAB — PROCALCITONIN: Procalcitonin: <0.1 ng/mL

## 2022-10-29 LAB — CREATININE, SERUM
Creatinine, Ser: 1.26 mg/dL — ABNORMAL HIGH (ref 0.44–1.00)
GFR, Estimated: 45 mL/min — ABNORMAL LOW (ref >60)

## 2022-10-29 LAB — CBG MONITORING, ED: Glucose-Capillary: 193 mg/dL — ABNORMAL HIGH (ref 70–99)

## 2022-10-29 MED ORDER — PERFLUTREN LIPID MICROSPHERE
1.0000 mL | INTRAVENOUS | Status: AC | PRN
  Administered 2022-10-29: 4 mL via INTRAVENOUS

## 2022-10-29 MED ORDER — CHLORHEXIDINE GLUCONATE CLOTH 2 % EX PADS
6.0000 | MEDICATED_PAD | Freq: Every day | CUTANEOUS | Status: AC
  Administered 2022-10-29 – 2022-10-31 (×4): 6 via TOPICAL

## 2022-10-29 MED ORDER — POTASSIUM CHLORIDE 10 MEQ/50ML IV SOLN
10.0000 meq | INTRAVENOUS | Status: AC
  Administered 2022-10-29 (×4): 10 meq via INTRAVENOUS
  Filled 2022-10-29 (×4): qty 50

## 2022-10-29 MED ORDER — ORAL CARE MOUTH RINSE
15.0000 mL | OROMUCOSAL | Status: DC
  Administered 2022-10-29 – 2022-10-31 (×28): 15 mL via OROMUCOSAL

## 2022-10-29 MED ORDER — ARFORMOTEROL TARTRATE 15 MCG/2ML IN NEBU
15.0000 ug | INHALATION_SOLUTION | Freq: Two times a day (BID) | RESPIRATORY_TRACT | Status: DC
  Administered 2022-10-29 – 2022-11-02 (×10): 15 ug via RESPIRATORY_TRACT
  Filled 2022-10-29 (×14): qty 2

## 2022-10-29 MED ORDER — ORAL CARE MOUTH RINSE: 15.0000 mL | OROMUCOSAL | Status: DC | PRN

## 2022-10-29 MED ORDER — INSULIN ASPART 100 UNIT/ML IJ SOLN
0.0000 [IU] | INTRAMUSCULAR | Status: DC
  Administered 2022-10-29 (×5): 3 [IU] via SUBCUTANEOUS
  Administered 2022-10-30 – 2022-10-31 (×4): 2 [IU] via SUBCUTANEOUS

## 2022-10-29 MED ORDER — MUPIROCIN 2 % EX OINT
1.0000 | TOPICAL_OINTMENT | Freq: Two times a day (BID) | CUTANEOUS | Status: AC
  Administered 2022-10-29 – 2022-11-02 (×10): 1 via NASAL
  Filled 2022-10-29 (×3): qty 22

## 2022-10-29 MED ORDER — AMIODARONE HCL IN DEXTROSE 360-4.14 MG/200ML-% IV SOLN
30.0000 mg/h | INTRAVENOUS | Status: DC
  Administered 2022-10-29 (×2): 30 mg/h via INTRAVENOUS
  Filled 2022-10-29: qty 200

## 2022-10-29 MED ORDER — FENTANYL BOLUS VIA INFUSION
25.0000 ug | INTRAVENOUS | Status: DC | PRN
  Administered 2022-10-29 (×3): 50 ug via INTRAVENOUS
  Administered 2022-10-30 (×2): 100 ug via INTRAVENOUS

## 2022-10-29 MED ORDER — ENOXAPARIN SODIUM 150 MG/ML IJ SOSY
150.0000 mg | PREFILLED_SYRINGE | Freq: Two times a day (BID) | INTRAMUSCULAR | Status: DC
  Administered 2022-10-29 – 2022-11-01 (×6): 150 mg via SUBCUTANEOUS
  Filled 2022-10-29 (×7): qty 1

## 2022-10-29 MED ORDER — SODIUM CHLORIDE 0.9 % IV SOLN: 2.0000 g | INTRAVENOUS | Status: DC

## 2022-10-29 MED ORDER — SODIUM CHLORIDE 0.9 % IV SOLN
2.0000 g | INTRAVENOUS | Status: DC
  Administered 2022-10-29 – 2022-10-30 (×2): 2 g via INTRAVENOUS
  Filled 2022-10-29 (×2): qty 20

## 2022-10-29 MED ORDER — MAGNESIUM SULFATE 2 GM/50ML IV SOLN
2.0000 g | Freq: Once | INTRAVENOUS | Status: AC
  Administered 2022-10-29: 2 g via INTRAVENOUS
  Filled 2022-10-29: qty 50

## 2022-10-29 MED ORDER — BUDESONIDE 0.25 MG/2ML IN SUSP
0.2500 mg | Freq: Two times a day (BID) | RESPIRATORY_TRACT | Status: DC
  Administered 2022-10-29 – 2022-11-02 (×10): 0.25 mg via RESPIRATORY_TRACT
  Filled 2022-10-29 (×10): qty 2

## 2022-10-29 MED ORDER — OXYCODONE HCL 5 MG PO TABS
5.0000 mg | ORAL_TABLET | Freq: Four times a day (QID) | ORAL | Status: DC
  Administered 2022-10-29 – 2022-10-31 (×9): 5 mg
  Filled 2022-10-29 (×9): qty 1

## 2022-10-29 MED ORDER — REVEFENACIN 175 MCG/3ML IN SOLN
175.0000 ug | Freq: Every day | RESPIRATORY_TRACT | Status: DC
  Administered 2022-10-29 – 2022-11-02 (×5): 175 ug via RESPIRATORY_TRACT
  Filled 2022-10-29 (×5): qty 3

## 2022-10-29 MED ORDER — AMIODARONE HCL IN DEXTROSE 360-4.14 MG/200ML-% IV SOLN
60.0000 mg/h | INTRAVENOUS | Status: AC
  Administered 2022-10-29: 60 mg/h via INTRAVENOUS
  Filled 2022-10-29 (×2): qty 200

## 2022-10-29 MED ORDER — PANTOPRAZOLE SODIUM 40 MG IV SOLR
40.0000 mg | Freq: Two times a day (BID) | INTRAVENOUS | Status: DC
  Administered 2022-10-29 (×3): 40 mg via INTRAVENOUS
  Filled 2022-10-29 (×3): qty 10

## 2022-10-29 MED ORDER — SODIUM CHLORIDE 0.9 % IV SOLN
500.0000 mg | INTRAVENOUS | Status: AC
  Administered 2022-10-29 – 2022-11-01 (×4): 500 mg via INTRAVENOUS
  Filled 2022-10-29 (×5): qty 5

## 2022-10-29 MED ORDER — SODIUM CHLORIDE 0.9 % IV SOLN: 500.0000 mg | INTRAVENOUS | Status: DC

## 2022-10-29 NOTE — Procedures (Signed)
Central Venous Catheter Insertion Procedure Note  Sharon Vasquez  [id number]  1950-11-13  Date:10/29/22  Time:12:09 AM   Provider Performing:Arria Naim Salena Saner Katrinka Blazing   Procedure: Insertion of Non-tunneled Central Venous [phone]) with US guidance ([id number])   Indication(s) Medication administration and Difficult access  Consent Verbal from sister at bedside  Anesthesia Topical only with 1% lidocaine   Timeout Verified patient identification, verified procedure, site/side was marked, verified correct patient position, special equipment/implants available, medications/allergies/relevant history reviewed, required imaging and test results available.  Sterile Technique Maximal sterile technique including full sterile barrier drape, hand hygiene, sterile gown, sterile gloves, mask, hair covering, sterile ultrasound probe cover (if used).  Procedure Description Area of catheter insertion was cleaned with chlorhexidine and draped in sterile fashion.  With real-time ultrasound guidance a central venous catheter was placed into the left internal jugular vein. Nonpulsatile blood flow and easy flushing noted in all ports.  The catheter was sutured in place and sterile dressing applied.  Complications/Tolerance None; patient tolerated the procedure well. Chest X-ray is ordered to verify placement for internal jugular or subclavian cannulation.   Chest x-ray is not ordered for femoral cannulation.  EBL Minimal  Specimen(s) None

## 2022-10-29 NOTE — Progress Notes (Signed)
Orthopedic Tech Progress Note Patient Details:  Sharon Vasquez Aug 01, 1950 [id number]  Patient ID: Sharon Vasquez, female   DOB: [birth year], 72 y.o.   [name]: [id number] Unna boots cannot be applied at this time due to patient not yet having wound care completed.  Darleen Crocker 10/29/2022, 11:53 AM

## 2022-10-29 NOTE — Progress Notes (Signed)
Pt transported from ED to 2M15 without incident

## 2022-10-29 NOTE — Progress Notes (Signed)
  Echocardiogram 2D Echocardiogram has been performed.  Sharon Vasquez 10/29/2022, 10:44 AM

## 2022-10-29 NOTE — Plan of Care (Signed)
  Problem: Education: Goal: Ability to describe self-care measures that may prevent or decrease complications (Diabetes Survival Skills Education) will improve Outcome: Not Progressing Goal: Individualized Educational Video(s) Outcome: Not Progressing   Problem: Coping: Goal: Ability to adjust to condition or change in health will improve Outcome: Not Progressing   Problem: Fluid Volume: Goal: Ability to maintain a balanced intake and output will improve Outcome: Not Progressing   Problem: Health Behavior/Discharge Planning: Goal: Ability to identify and utilize available resources and services will improve Outcome: Not Progressing Goal: Ability to manage health-related needs will improve Outcome: Not Progressing   Problem: Metabolic: Goal: Ability to maintain appropriate glucose levels will improve Outcome: Not Progressing   Problem: Nutritional: Goal: Maintenance of adequate nutrition will improve Outcome: Not Progressing Goal: Progress toward achieving an optimal weight will improve Outcome: Not Progressing   Problem: Skin Integrity: Goal: Risk for impaired skin integrity will decrease Outcome: Not Progressing   Problem: Tissue Perfusion: Goal: Adequacy of tissue perfusion will improve Outcome: Not Progressing   Problem: Education: Goal: Knowledge of General Education information will improve Description: Including pain rating scale, medication(s)/side effects and non-pharmacologic comfort measures Outcome: Not Progressing   Problem: Health Behavior/Discharge Planning: Goal: Ability to manage health-related needs will improve Outcome: Not Progressing   Problem: Clinical Measurements: Goal: Ability to maintain clinical measurements within normal limits will improve Outcome: Not Progressing Goal: Will remain free from infection Outcome: Not Progressing Goal: Diagnostic test results will improve Outcome: Not Progressing Goal: Respiratory complications will  improve Outcome: Not Progressing Goal: Cardiovascular complication will be avoided Outcome: Not Progressing   Problem: Activity: Goal: Risk for activity intolerance will decrease Outcome: Not Progressing

## 2022-10-29 NOTE — Progress Notes (Signed)
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  She arrived to the ER in extremis and was intubated for severe hypoxemia.  Family at bedside and states she is not having any recent infectious symptoms.  Pulmonary critical care is asked to admit for acute on chronic hypoxemic and hypercarbic respiratory failure with bilateral infiltrates  Pertinent  Medical History  COPD,  ILD - HRCT suggest "alternative" diagnosis- RB ILD versus hypersensitivity pneumonitis  PFTs 03/2021 moderate restriction, ratio 89, FEV1 74%, FVC 65%, TLC 54%, DLCO 15.3/62%  OSA on CPAP 13 cm -her machine broke and DME states she is not qualified until January 2025 to obtain new machine HFpEF Atrial fibrillation Prior DVT and PE 2015  on Xarelto Gastric reflux Chronic degenerative joint disease with chronic pain and opiate abuse Smoker - up to 2 packs/day   Significant Hospital Events: Including procedures, antibiotic start and stop dates in addition to other pertinent events   7/27 admit, ETT, CVL  Interim History / Subjective:  Critically ill, intubated Hypotensive postintubation requiring Levophed up to 30 mics, decreasing to 9 mics Low-grade febrile FiO2 decreased to 40% Not much response to Lasix 80  Objective   Blood pressure (!) 104/92, pulse 72, temperature 99.3 F (37.4 C), resp. rate (!) 24, height 6\' 2"  (1.88 m), weight (!) 166.5 kg, SpO2 97%. CVP:  [13 mmHg-24 mmHg] 13 mmHg  Vent Mode: PRVC FiO2 (%):  [40 %-100 %] 40 % Set Rate:  [12 bmp-24 bmp] 24 bmp Vt Set:  [580 mL-620 mL] 620 mL PEEP:  [8 cmH20-12 cmH20] 8 cmH20 Plateau Pressure:  [24 cmH20-33 cmH20] 24 cmH20    Intake/Output Summary (Last 24 hours) at 10/29/2022 0902 Last data filed at 10/29/2022 0800 Gross per 24 hour  Intake 1645.01 ml  Output 500 ml  Net 1145.01 ml   Filed Weights   10/28/22 2149 10/29/22 0434  Weight: (!) 163.3 kg (!) 166.5 kg    Examination: General: Obese woman in no acute distress on ventilator, sedated HENT: ETT in place with minimal secretions, no obvious JVD Lungs: Decreased breath sounds bilateral, no accessory muscle use Cardiovascular: Heart sounds are irregular , atrial fibrillation on monitor Abdomen: Soft, positive bowel sounds Extremities: Chronic lymphedema Neuro: Sedated on propofol and fentanyl, RASS -2 GU: Foley with sterile urine  ABG shows mild metabolic acidosis Labs show BUN/creatinine 15/1.4, normal electrolytes, mild leukocytosis, slight elevated troponin 140 Chest x-ray 7/28 shows improved edema pattern, cardiomegaly, patchy infiltrates in the left  Resolved Hospital Problem list   Not applicable  Assessment & Plan:  Acute on chronic hypoxemic and hypercarbic respiratory failure-do not think VTE given compliance with blood thinner.  X-ray with pulmonary congestion pattern although suppose could be bilateral infection Baseline home oxygen dependence Morbid obesity without obvious OHS given normal serum bicarb in past BMP Severe OSA -CPAP machine not working Rapid improvement in x-ray suggests that current worsening was related to pulmonary edema, not much diuresis with 80 of Lasix   -Drop PEEP to 5 Spontaneous breathing trial but hold off extubation until negative balance - Triple therapy nebs, no role for steroids   Initial hypertensive state now hypotensive state  after induction Doubt sepsis Low procalcitonin reassuring that there is no infection  -Continue Levophed to goal MAP 65 and above -Await echo, previous does not show significant pulm hypertension - Ceftriaxone/azithromycin  HFpEF  -Hold home losartan -Not much  response to Lasix, will repeat once blood pressure improves  Sedation needs on vent Chronic opiate use -resume home oxycodone DC propofol, use fentanyl drip with goal RASS 0 to -1   Acute kidney injury  -Follow urine output and BMET Renal ultrasound negative for obstruction   Afib/RVR- initial trop neg -- Xarelto to lovenox  -Amio drip Hold home metoprolol   DM2 -SSI   Best Practice (right click and "Reselect all SmartList Selections" daily)   Diet/type: NPO DVT prophylaxis: systemic dose LMWH GI prophylaxis: PPI Lines: yes and it is still needed Foley:  Yes, and it is still needed Code Status:  full code Last date of multidisciplinary goals of care discussion [updated sister and daughter at bedside]  Labs   CBC: Recent Labs  Lab 10/28/22 2210 10/28/22 2236 10/29/22 0020 10/29/22 0107 10/29/22 0422  WBC 12.7*  --  14.8*  --  13.1*  NEUTROABS 5.7  --   --   --   --   HGB 15.1* 12.9 12.8 13.3 14.0  HCT 48.2* 38.0 40.4 39.0 41.6  MCV 89.9  --  86.5  --  85.8  PLT 704*  --  676*  --  733*    Basic Metabolic Panel: Recent Labs  Lab 10/28/22 2202 10/28/22 2210 10/28/22 2236 10/29/22 0020 10/29/22 0107 10/29/22 0422  NA 142 141 142  --  141 137  K 3.9 3.8 3.7  --  4.1 4.3  CL 107 103  --   --   --  103  CO2  --  19*  --   --   --  21*  GLUCOSE 261* 267*  --   --   --  239*  BUN 15 13  --   --   --  15  CREATININE 1.10* 1.16*  --  1.26*  --  1.40*  CALCIUM  --  9.2  --   --   --  8.5*  MG  --   --   --   --   --  2.3  PHOS  --   --   --   --   --  4.5   GFR: Estimated Creatinine Clearance: 64 mL/min (A) (by C-G formula based on SCr of 1.4 mg/dL (H)). Recent Labs  Lab 10/28/22 2210 10/28/22 2238 10/29/22 0020 10/29/22 0032 10/29/22 0422  PROCALCITON <0.10  --   --   --   --   WBC 12.7*  --  14.8*  --  13.1*  LATICACIDVEN  --  4.2*  --  2.4*  --     Liver Function Tests: Recent Labs  Lab 10/28/22 2210 10/29/22 0422  AST 26 40  ALT 21 29   ALKPHOS 112 111  BILITOT 0.4 1.2  PROT 7.6 7.0  ALBUMIN 3.8 3.5   No results for input(s): "LIPASE", "AMYLASE" in the last 168 hours. No results for input(s): "AMMONIA" in the last 168 hours.  ABG    Component Value Date/Time   PHART 7.327 (L) 10/29/2022 0107   PCO2ART 40.5 10/29/2022 0107   PO2ART 178 (H) 10/29/2022 0107   HCO3 21.2 10/29/2022 0107   TCO2 22 10/29/2022 0107   ACIDBASEDEF 4.0 (H) 10/29/2022 0107   O2SAT 99 10/29/2022 0107  Coagulation Profile: Recent Labs  Lab 10/28/22 2259  INR 1.5*    Cardiac Enzymes: No results for input(s): "CKTOTAL", "CKMB", "CKMBINDEX", "TROPONINI" in the last 168 hours.  HbA1C: Hemoglobin A1C  Date/Time Value Ref Range Status  12/02/2021 08:59 AM 5.8 (A) 4.0 - 5.6 % Final  08/25/2021 09:11 AM 6.2 (A) 4.0 - 5.6 % Final   Hgb A1c MFr Bld  Date/Time Value Ref Range Status  04/27/2022 04:07 AM 5.6 4.8 - 5.6 % Final    Comment:    (NOTE) Pre diabetes:          5.7%-6.4%  Diabetes:              >6.4%  Glycemic control for   <7.0% adults with diabetes   07/19/2017 10:17 AM 5.9 (H) 4.8 - 5.6 % Final    Comment:             Prediabetes: 5.7 - 6.4          Diabetes: >6.4          Glycemic control for adults with diabetes: <7.0     CBG: Recent Labs  Lab 10/29/22 0041 10/29/22 0808  GLUCAP 193* 198*     Critical care time: 35 mins independent of procedures     Cyril Mourning MD. FCCP. [facility] Pager : [phone]  If no response to pager , please call [phone] until 7 pm After 7:00 pm call Elink  [phone]   10/29/2022

## 2022-10-29 NOTE — Progress Notes (Signed)
eLink Physician-Brief Progress Note Patient Name: CASH SNUGGS DOB: May 19, 1950 [name]: [id number]   Date of Service  10/29/2022  HPI/Events of Note  72 year old female with a history of COPD and chronic respiratory failure on home oxygen admitted through ED for hypoxemia with severe shortness of breath.  Found to be hypoxemic and hypercapnic.  Intubated in the ED. postintubation ABG shows resolution of hypercarbia and hypoxemia. She also presented with A-fib with RVR.  Currently on amiodarone. Post intubation and sedation, she is hypotensive.  On norepinephrine to maintain hemodynamic stability.  eICU Interventions       Intervention Category Evaluation Type: New Patient Evaluation  Carilyn Goodpasture 10/29/2022, 5:28 AM

## 2022-10-30 ENCOUNTER — Inpatient Hospital Stay (HOSPITAL_COMMUNITY): Payer: Medicare HMO

## 2022-10-30 ENCOUNTER — Telehealth: Payer: Self-pay

## 2022-10-30 DIAGNOSIS — J189 Pneumonia, unspecified organism: Secondary | ICD-10-CM

## 2022-10-30 DIAGNOSIS — I469 Cardiac arrest, cause unspecified: Secondary | ICD-10-CM | POA: Diagnosis not present

## 2022-10-30 DIAGNOSIS — I5041 Acute combined systolic (congestive) and diastolic (congestive) heart failure: Secondary | ICD-10-CM

## 2022-10-30 DIAGNOSIS — J810 Acute pulmonary edema: Secondary | ICD-10-CM | POA: Diagnosis not present

## 2022-10-30 DIAGNOSIS — J9601 Acute respiratory failure with hypoxia: Secondary | ICD-10-CM | POA: Diagnosis not present

## 2022-10-30 LAB — GLUCOSE, CAPILLARY
Glucose-Capillary: 104 mg/dL — ABNORMAL HIGH (ref 70–99)
Glucose-Capillary: 110 mg/dL — ABNORMAL HIGH (ref 70–99)
Glucose-Capillary: 120 mg/dL — ABNORMAL HIGH (ref 70–99)
Glucose-Capillary: 121 mg/dL — ABNORMAL HIGH (ref 70–99)
Glucose-Capillary: 130 mg/dL — ABNORMAL HIGH (ref 70–99)
Glucose-Capillary: 138 mg/dL — ABNORMAL HIGH (ref 70–99)

## 2022-10-30 LAB — COOXEMETRY PANEL
Carboxyhemoglobin: 2 % — ABNORMAL HIGH (ref 0.5–1.5)
Methemoglobin: <0.7 % (ref 0.0–1.5)
O2 Saturation: 75.4 %
Total hemoglobin: 12 g/dL (ref 12.0–16.0)

## 2022-10-30 LAB — TROPONIN I (HIGH SENSITIVITY)
Troponin I (High Sensitivity): 100 ng/L (ref <18)
Troponin I (High Sensitivity): 105 ng/L (ref <18)

## 2022-10-30 MED ORDER — PROSOURCE TF20 ENFIT COMPATIBL EN LIQD
60.0000 mL | Freq: Three times a day (TID) | ENTERAL | Status: DC
  Administered 2022-10-30 – 2022-10-31 (×3): 60 mL
  Filled 2022-10-30 (×3): qty 60

## 2022-10-30 MED ORDER — MAGNESIUM SULFATE 2 GM/50ML IV SOLN
2.0000 g | Freq: Once | INTRAVENOUS | Status: AC
  Administered 2022-10-30: 2 g via INTRAVENOUS
  Filled 2022-10-30: qty 50

## 2022-10-30 MED ORDER — SODIUM CHLORIDE 3 % IN NEBU
4.0000 mL | INHALATION_SOLUTION | Freq: Two times a day (BID) | RESPIRATORY_TRACT | Status: DC
  Administered 2022-10-30 – 2022-11-01 (×5): 4 mL via RESPIRATORY_TRACT
  Filled 2022-10-30 (×5): qty 4

## 2022-10-30 MED ORDER — NOREPINEPHRINE 16 MG/250ML-% IV SOLN
0.0000 ug/min | INTRAVENOUS | Status: DC
  Administered 2022-10-30: 5 ug/min via INTRAVENOUS
  Filled 2022-10-30: qty 250

## 2022-10-30 MED ORDER — GUAIFENESIN 100 MG/5ML PO LIQD
10.0000 mL | Freq: Two times a day (BID) | ORAL | Status: DC
  Administered 2022-10-30 – 2022-10-31 (×3): 10 mL
  Filled 2022-10-30 (×3): qty 10

## 2022-10-30 MED ORDER — PANTOPRAZOLE SODIUM 40 MG IV SOLR
40.0000 mg | Freq: Every day | INTRAVENOUS | Status: DC
  Administered 2022-10-30 – 2022-10-31 (×2): 40 mg via INTRAVENOUS
  Filled 2022-10-30 (×2): qty 10

## 2022-10-30 MED ORDER — VITAL 1.5 CAL PO LIQD
1000.0000 mL | ORAL | Status: DC
  Administered 2022-10-30: 1000 mL

## 2022-10-30 MED ORDER — ASPIRIN 81 MG PO CHEW
81.0000 mg | CHEWABLE_TABLET | Freq: Every day | ORAL | Status: DC
  Administered 2022-10-30 – 2022-10-31 (×2): 81 mg
  Filled 2022-10-30 (×2): qty 1

## 2022-10-30 NOTE — Telephone Encounter (Signed)
Incoming fax from pharmacy Medication:Wegovy Message to prescriber: "drug request change, drug not covered by plan"

## 2022-10-30 NOTE — Consult Note (Signed)
WOC Nurse Consult Note: Reason for Consult: Consult requested for bilat legs. Performed remotely after review of progress notes and photos in the EMR. Pt is followed as an outpatient for chronic full thickness stasis ulcers to bilat lower legs and chronic lymphedema.  Left leg is pink and healing shallow wound and right leg is moist and yellow full thickness wound.  Pt was wearing bilat Una boots prior to admission which were changed weekly.  Topical treatment orders provided for ortho tech to perform as follows: Apply bilat Una boots and change Q Mon. Please re-consult if further assistance is needed.  Thank-you,  Cammie Mcgee MSN, RN, CWOCN, [place], CNS [phone]

## 2022-10-30 NOTE — Plan of Care (Signed)
  Problem: Clinical Measurements: Goal: Will remain free from infection Outcome: Progressing Goal: Diagnostic test results will improve Outcome: Progressing Goal: Respiratory complications will improve Outcome: Progressing   Problem: Coping: Goal: Level of anxiety will decrease Outcome: Progressing   Problem: Elimination: Goal: Will not experience complications related to urinary retention Outcome: Progressing   Problem: Pain Managment: Goal: General experience of comfort will improve Outcome: Progressing   Problem: Safety: Goal: Ability to remain free from injury will improve Outcome: Progressing   Problem: Education: Goal: Ability to describe self-care measures that may prevent or decrease complications (Diabetes Survival Skills Education) will improve Outcome: Not Progressing Goal: Individualized Educational Video(s) Outcome: Not Progressing   Problem: Coping: Goal: Ability to adjust to condition or change in health will improve Outcome: Not Progressing   Problem: Fluid Volume: Goal: Ability to maintain a balanced intake and output will improve Outcome: Not Progressing   Problem: Health Behavior/Discharge Planning: Goal: Ability to identify and utilize available resources and services will improve Outcome: Not Progressing Goal: Ability to manage health-related needs will improve Outcome: Not Progressing   Problem: Metabolic: Goal: Ability to maintain appropriate glucose levels will improve Outcome: Not Progressing   Problem: Nutritional: Goal: Maintenance of adequate nutrition will improve Outcome: Not Progressing Goal: Progress toward achieving an optimal weight will improve Outcome: Not Progressing   Problem: Skin Integrity: Goal: Risk for impaired skin integrity will decrease Outcome: Not Progressing

## 2022-10-30 NOTE — Progress Notes (Signed)
Initial Nutrition Assessment  DOCUMENTATION CODES:   Obesity unspecified  INTERVENTION:  Initiate tube feeding via OGT: Vital 1.5 at 55 ml/h (1320 ml per day) Start at 25 and advance by 10mL q6h to goal of 55 Prosource TF20 60 ml TID Provides 2220 kcal, 149 gm protein, 1009 ml free water daily  NUTRITION DIAGNOSIS:   Inadequate oral intake related to inability to eat as evidenced by NPO status.  GOAL:   Patient will meet greater than or equal to 90% of their needs  MONITOR:   Vent status, I & O's, Labs, TF tolerance  REASON FOR ASSESSMENT:   Ventilator    ASSESSMENT:   Pt with hx of CHF, COPD, GERD, HTN, HLD, DM type 2, prior CVA, hx breast cancer, and atrial fibrillation presented to ED with acute SOB. Intubated in ED.  7/27 - admitted, intubated  Patient is currently intubated on ventilator support. Visitor at bedside able to provide a hx. States that pt has been eating less over the last few months due to swallowing issues. Noted a hx of esophageal dysmotility and dilation in chart. States that pt's weight appears to have remained stable during this period.  Discussed with MD, will not be extubated today. Ok to initiate TF. OGT in place with sideport in the stomach. Entered recommendations and discussed with RN.   MV: 12.4 L/min Temp (24hrs), Avg:99.1 F (37.3 C), Min:98.5 F (36.9 C), Max:99.5 F (37.5 C)   Intake/Output Summary (Last 24 hours) at 10/30/2022 1543 Last data filed at 10/30/2022 1500 Gross per 24 hour  Intake 1427.49 ml  Output 1175 ml  Net 252.49 ml  Net IO Since Admission: 1,618.15 mL [10/30/22 1543]  Nutritionally Relevant Medications: Scheduled Meds:  docusate  100 mg Per Tube BID   insulin aspart  0-15 Units Subcutaneous Q4H   pantoprazole IV  40 mg Intravenous QHS   polyethylene glycol  17 g Per Tube Daily   Continuous Infusions:  azithromycin Stopped (10/29/22 2225)   cefTRIAXone (ROCEPHIN)  IV Stopped (10/29/22 2144)    norepinephrine (LEVOPHED) Adult infusion 4 mcg/min (10/30/22 1400)   PRN Meds:.docusate, polyethylene glycol  Labs Reviewed: Creatinine 1.36 CBG ranges from 110-138 mg/dL over the last 24 hours HgbA1c 5.8%  NUTRITION - FOCUSED PHYSICAL EXAM: Flowsheet Row Most Recent Value  Orbital Region No depletion  Upper Arm Region No depletion  Thoracic and Lumbar Region No depletion  Buccal Region No depletion  Temple Region No depletion  Clavicle Bone Region No depletion  Clavicle and Acromion Bone Region No depletion  Scapular Bone Region No depletion  Dorsal Hand No depletion  Patellar Region No depletion  Anterior Thigh Region No depletion  Posterior Calf Region No depletion  Edema (RD Assessment) Moderate  Hair Reviewed  Eyes Reviewed  Mouth Reviewed  Skin Reviewed  Nails Reviewed    Diet Order:   Diet Order             Diet NPO time specified  Diet effective now                   EDUCATION NEEDS:  Not appropriate for education at this time  Skin:  Skin Assessment: Reviewed RN Assessment Venous stasis ulcer  - left pretibial region (0.75 x 0.75 cm) - right posterior tibia (3.75 x 2 cm)  Last BM:  unsure  Height:  Ht Readings from Last 1 Encounters:  10/28/22 6\' 2"  (1.88 m)    Weight:  Wt Readings from Last 1 Encounters:  10/30/22 (!) 166.9 kg    Ideal Body Weight:  77.3 kg  BMI:  Body mass index is 47.24 kg/m.  Estimated Nutritional Needs:  Kcal:  2200-2400 kcal/d Protein:  140-155g/d Fluid:  2.2L/d    Greig Castilla, RD, LDN Clinical Dietitian RD pager # available in [facility]  After hours/weekend pager # available in [facility]

## 2022-10-30 NOTE — Progress Notes (Signed)
[facility] ADULT ICU REPLACEMENT PROTOCOL   The patient does apply for the [facility] Adult ICU Electrolyte Replacment Protocol based on the criteria listed below:   1.Exclusion criteria: TCTS, ECMO, Dialysis, and Myasthenia Gravis patients 2. Is GFR >/= 30 ml/min? Yes.    Patient's GFR today is 41 3. Is SCr </= 2? Yes.   Patient's SCr is 1.36 mg/dL 4. Did SCr increase >/= 0.5 in 24 hours? No. 5.Pt's weight >40kg  Yes.   6. Abnormal electrolyte(s): Mag 1.9  7. Electrolytes replaced per protocol 8.  Call MD STAT for K+ </= 2.5, Phos </= 1, or Mag </= 1 Physician:  Dr. Loralyn Freshwater, Lilia Argue 10/30/2022 3:30 AM

## 2022-10-30 NOTE — Progress Notes (Signed)
eLink Physician-Brief Progress Note Patient Name: Sharon Vasquez DOB: [birth year] [name]: [id number]   Date of Service  10/30/2022  HPI/Events of Note  72 year old female with a history of COPD and chronic respiratory failure on home oxygen admitted through ED for hypoxemia with severe shortness of breath.   Having frequent pauses up to 6 seconds.    eICU Interventions  DC amio gtt, electrolyte checks     Intervention Category Intermediate Interventions: Arrhythmia - evaluation and management  Latham Kinzler 10/30/2022, 1:23 AM

## 2022-10-30 NOTE — Procedures (Signed)
Bronchoscopy Procedure Note  Sharon Vasquez  [id number]  1950-08-31  Date:10/30/22  Time:8:46 AM   Provider Performing:Tacarra Justo V. Jacquie Lukes   Procedure(s):  Flexible bronchoscopy with bronchial alveolar lavage (40981)  Indication(s) Mucus plugging, cardiac arrest  Consent Unable to obtain consent due to emergent nature of procedure.  Anesthesia fentanyl   Time Out Verified patient identification, verified procedure, site/side was marked, verified correct patient position, special equipment/implants available, medications/allergies/relevant history reviewed, required imaging and test results available.   Sterile Technique Usual hand hygiene, masks, gowns, and gloves were used   Procedure Description Bronchoscope advanced through endotracheal tube and into airway.  Airways were examined down to subsegmental level with findings noted below.   Following diagnostic evaluation, BAL(s) performed in BLL with normal saline and return of purulent fluid  Findings: Thick mucus plugs & secretions suctioned from both lower lobes   Complications/Tolerance None; patient tolerated the procedure well. Chest X-ray is not needed post procedure.   EBL Minimal   Specimen(s) BAL for culture   Sharon Vasquez V. Vassie Loll MD

## 2022-10-30 NOTE — Progress Notes (Signed)
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  She arrived to the ER in extremis and was intubated for severe hypoxemia.  Family at bedside and states she is not having any recent infectious symptoms.  Pulmonary critical care is asked to admit for acute on chronic hypoxemic and hypercarbic respiratory failure with bilateral infiltrates.  Significant Hospital Events:  7/27 admit, ETT, CVL 7/29 cardiac arrest due to respiratory arrest from mucus plug  Interim History / Subjective:  Respiratory arrest this morning with thick mucus suctioned from ETT. Prior to that doing well on pressure support, was ready for extubation. Stable status post arrest. ECHO yesterday notable for newly reduced LVEF <20% with global hypokinesis.  Objective   Blood pressure 105/67, pulse 65, temperature 98.8 F (37.1 C), resp. rate 11, height 6\' 2"  (1.88 m), weight (!) 166.9 kg, SpO2 97%. CVP:  [10 mmHg-13 mmHg] 10 mmHg  Vent Mode: PRVC FiO2 (%):  [40 %-60 %] 60 % Set Rate:  [20 bmp] 20 bmp Vt Set:  [620 mL] 620 mL PEEP:  [5 cmH20] 5 cmH20 Pressure Support:  [10 cmH20] 10 cmH20 Plateau Pressure:  [19 cmH20-24 cmH20] 19 cmH20   Intake/Output Summary (Last 24 hours) at 10/30/2022 4098 Last data filed at 10/30/2022 0600 Gross per 24 hour  Intake 1365.65 ml  Output 650 ml  Net 715.65 ml   Filed Weights   10/28/22 2149 10/29/22 0434 10/30/22 0500  Weight: (!) 163.3 kg (!) 166.5 kg (!) 166.9 kg    Examination: No distress ETT in place on PS Lungs clear, decreased sounds, no accessory muscle use Heart rate tachycardic, rhythm irregular, strong radial and femoral pulses, extremities warm Abdomen soft Extremities with chronic lymphedema changes Alert, following  simple instructions Foley in place  Resolved Hospital Problem list   Resolved Problems:   * No resolved hospital problems. *   Assessment & Plan:  Principal Problem:   CHF (congestive heart failure) (HCC)  Acute on chronic hypoxemic and hypercarbic respiratory failure Baseline home oxygen dependence Morbid obesity  -Probably pulmonary edema in setting of new HFrEF -Without obvious OHS given normal serum bicarb in past BMP -Severe OSA, CPAP machine not working -Rapid improvement in x-ray suggests that current worsening was related to pulmonary edema, not much diuresis with 80 of Lasix -arformoterol, revefenacin, budesonide via nebulizer -Finish course of ceftriaxone/azithromycin for empiric treatment of CAP   Mucus plugging Respiratory arrest Cardiac arrest this morning after witnessed respiratory arrest, suctioned thick mucus from ETT during bronchoscopy. Aggressive pulmonary toilet today, plan to retry extubation tomorrow.  -Guaifenesin per tube BID   HFrEF CAD -Newly reduced LVEF <20% with regional wall motion abnormalities -Coox 75% -Hold home torsemide -Hold home losartan -appreciate cardiology input, anticipate ischemic workup once she stabilizes  Acute kidney injury  -In setting of HFrEF -Follow urine output and BMET -Renal ultrasound negative for obstruction    Afib/RVR -Rate controlled currently -Treatment-dose Lovenox -Amio held due to sinus pauses overnight  Initial hypertensive state now hypotensive state after induction Doubt sepsis Low procalcitonin reassuring that there is no infection  -Continue Levophed to goal MAP 65 and above -Ceftriaxone/azithromycin   Sedation needs on vent Chronic opiate use  DC propofol, use fentanyl drip with goal RASS 0  to -1 -Home oxycodone 5 mg q6h    DM2 -SSI  Best practice (daily eval):  Diet/type: NPO DVT prophylaxis: systemic dose LMWH GI prophylaxis: PPI Lines: Central line and yes and it is still  needed Foley:  Yes, and it is still needed Code Status:  full code  Marrianne Mood MD 10/30/2022, 8:22 AM  Pager: [phone]

## 2022-10-30 NOTE — Progress Notes (Signed)
Orthopedic Tech Progress Note Patient Details:  Sharon Vasquez Nov 30, 1950 [id number]  Applied with daughters at the bedside   Ortho Devices Type of Ortho Device: Ace wrap, Unna boot Ortho Device/Splint Location: BLE Ortho Device/Splint Interventions: Ordered, Application, Adjustment   Post Interventions Patient Tolerated: Well Instructions Provided: Care of device  Donald Pore 10/30/2022, 6:19 PM

## 2022-10-30 NOTE — Consult Note (Addendum)
Cardiology Consultation   Patient ID: LUCRETIA CHATTEN [name]: [id number]; DOB: 21-Apr-1950  Admit date: 10/28/2022 Date of Consult: 10/30/2022  PCP:  [name], DO   [facility] Providers Cardiologist:  Nanetta Batty, MD        Patient Profile:   JAMILETTE COLYAR is a 72 y.o. female with a hx of HFpEF, Afib, COPD on home O2, resp failure, DM, HTN, HLD, DVT 2018, OSA, CVA, who is being seen 10/30/2022 for the evaluation of LVD at the request of Dr Vassie Loll.  History of Present Illness:   Ms. Buehrle presented to the ER on 7/27 with shortness of breath after car trip.  She was an extremis and was intubated for severe hypoxemia.  She had been compliant with blood thinners, so was not felt likely to have a PE.  She has severe OSA and her home CPAP machine was not working.  After admission, she became hypotensive and was started on Levophed.  Her home losartan and Toprol were both held.  Her blood pressure gradually improved and yesterday p.m. the Levophed was discontinued.  Her A-fib was rapid at times and she was started on amiodarone drip with the metoprolol held because of blood pressure issues.  Her heart rate improved and the amiodarone was discontinued.  Xarelto was transitioned to Lovenox.  An echo was performed on 7/28 and it showed an EF of of 20% (previously 60-65%).  Cardiology was asked to evaluate her.  Of note, overnight, she began having pauses between 2.5 and 6 seconds.  She then developed tachycardia.  This morning at approximately 8:00, she developed a mucous plug in her endotracheal tube.  She became hypoxic and unresponsive and presumably vagal as her heart rate dropped into the 30s.  She required brief CPR for her inadequate heart rate.  The endotracheal tube was unplugged with suction.  She then had a bronc which returned purulent fluid, thick mucous plugs and secretions which were suctioned from both lower lobes.  Because of this, her blood pressure dropped  and she was started back on Levophed.  The plan had been for her to be extubated today, but she remains intubated, on a fentanyl drip for sedation.  Admission was obtained from chart notes nursing staff, and 2 daughters which were present.   Past Medical History:  Diagnosis Date   A-fib (HCC)    Acute hypoxemic respiratory failure (HCC) 02/20/2022   Acute on chronic hypoxic respiratory failure (HCC) 01/04/2022   Acute upper respiratory infection 01/08/2022   Anxiety    Arthritis    "qwhre; joints, back" (04/17/2017)   Atrial fibrillation (HCC)    Benign breast cyst in female, left 01/08/2017   Found by Screening mammogram, evaluated by U/S on 01/08/17 and determined to be a benign simple breast cyst.   Breast cancer (HCC)    Cellulitis of left lower leg 05/30/2017   CHF (congestive heart failure) (HCC)    Chronic low back pain 08/21/2016   Chronic lower back pain    Chronic venous insufficiency    /notes 05/30/2017   COPD (chronic obstructive pulmonary disease) (HCC)    Depression    Diabetes (HCC) 01/08/2022   Diabetes mellitus without complication (HCC)    DVT (deep venous thrombosis) (HCC) 11/16/2016   Dysrhythmia    Esophageal dysmotility 11/10/2019   Previous workup by ENT with fiberoptic leryngoscopy which was normal. Dx with laryngeal pharyngeal reflux.   GERD (gastroesophageal reflux disease)    Headache    "  weekly for the last 3 months" (04/17/2017)   History of pulmonary embolism    Pulmonary embolism   Hyperlipidemia    Hypertension    Laryngopharyngeal reflux 12/18/2015   Morbid obesity (HCC)    PE (pulmonary embolism)    Pulmonary embolism (HCC) 09/21/2014   Sleep apnea    Stroke (HCC) 12/02/2021    Past Surgical History:  Procedure Laterality Date   ABDOMINAL HYSTERECTOMY     APPENDECTOMY     BALLOON DILATION N/A 03/09/2020   Procedure: BALLOON DILATION;  Surgeon: Kathi Der, MD;  Location: [facility];  Service: Gastroenterology;  Laterality:  N/A;   BIOPSY  03/09/2020   Procedure: BIOPSY;  Surgeon: Kathi Der, MD;  Location: [facility];  Service: Gastroenterology;;   BIOPSY  04/12/2021   Procedure: BIOPSY;  Surgeon: Kathi Der, MD;  Location: [facility];  Service: Gastroenterology;;   BREAST BIOPSY Right 03/11/2021   US biopsy/ coil clip/ path pending   BREAST BIOPSY Right 03/11/2021   US biopsy/ ribbone clip/ path pending   BREAST CYST EXCISION Left    "six o'clock"   BREAST LUMPECTOMY Left    BREAST LUMPECTOMY WITH RADIOACTIVE SEED LOCALIZATION Right 05/02/2021   Procedure: RIGHT BREAST LUMPECTOMY WITH RADIOACTIVE SEED LOCALIZATION;  Surgeon: Griselda Miner, MD;  Location: [facility];  Service: General;  Laterality: Right;   CHOLECYSTECTOMY     DILATION AND CURETTAGE OF UTERUS     ESOPHAGOGASTRODUODENOSCOPY (EGD) WITH PROPOFOL N/A 03/09/2020   Procedure: ESOPHAGOGASTRODUODENOSCOPY (EGD) WITH PROPOFOL;  Surgeon: Kathi Der, MD;  Location: [facility];  Service: Gastroenterology;  Laterality: N/A;   ESOPHAGOGASTRODUODENOSCOPY (EGD) WITH PROPOFOL N/A 04/12/2021   Procedure: ESOPHAGOGASTRODUODENOSCOPY (EGD) WITH PROPOFOL;  Surgeon: Kathi Der, MD;  Location: [facility];  Service: Gastroenterology;  Laterality: N/A;   IR ABLATE LIVER CRYOABLATION  07/23/2019   IR RADIOLOGIST EVAL & MGMT  07/18/2019   SAVORY DILATION N/A 04/12/2021   Procedure: SAVORY DILATION;  Surgeon: Kathi Der, MD;  Location: [facility];  Service: Gastroenterology;  Laterality: N/A;   TONSILLECTOMY AND ADENOIDECTOMY     TUBAL LIGATION       Home Medications:  Prior to Admission medications   Medication Sig Start Date End Date Taking? Authorizing Provider  albuterol (VENTOLIN HFA) 108 (90 Base) MCG/ACT inhaler Inhale 2 puffs into the lungs every 4 (four) hours as needed for shortness of breath. 07/10/22  Yes Atway, Rayann N, DO  aspirin EC 81 MG tablet Take 81 mg by mouth daily. 12/15/20  Yes [provider]   atorvastatin (LIPITOR) 80 MG tablet Take 1 tablet (80 mg total) by mouth daily. 10/09/22  Yes Atway, Rayann N, DO  esomeprazole (NEXIUM) 40 MG capsule Take 1 capsule (40 mg total) by mouth daily as needed (for heartburn or indigestion). Patient taking differently: Take 40 mg by mouth daily. 01/20/19  Yes Synetta Fail, MD  Fluticasone-Umeclidin-Vilant (TRELEGY ELLIPTA) 100-62.5-25 MCG/ACT AEPB Inhale 1 puff into the lungs daily. 05/17/22  Yes Nooruddin, Jason Fila, MD  letrozole [facility]) 2.5 MG tablet TAKE 1 TABLET(2.5 MG) BY MOUTH DAILY 06/14/22  Yes Serena Croissant, MD  LORazepam (ATIVAN) 0.5 MG tablet Take 0.5 mg by mouth daily as needed for anxiety.   Yes [provider]  losartan (COZAAR) 25 MG tablet Take 1 tablet (25 mg total) by mouth daily. 02/22/22 02/22/23 Yes Patel, Azucena Cecil, DO  metoprolol succinate (TOPROL-XL) 25 MG 24 hr tablet TAKE 1 TABLET BY MOUTH DAILY 02/09/22  Yes Merrilyn Puma, MD  MYRBETRIQ 50 MG [phone]  tablet TAKE 1 TABLET BY MOUTH DAILY 01/25/22  Yes Doran Stabler, DO  naloxone [facility]) nasal spray 4 mg/0.1 mL Place 1 spray into the nose once as needed (for overdose suspected). for opioid overdose 11/02/21  Yes [provider]  oxybutynin (DITROPAN) 5 MG tablet Take 1 tablet (5 mg total) by mouth 3 (three) times daily. 10/17/22  Yes Marrianne Mood, MD  oxyCODONE (ROXICODONE) 15 MG immediate release tablet Take 15 mg by mouth See admin instructions. Take 15mg  (1 tablet) by mouth every morning and 15mg  (1 tablet) at bedtime in addition to twice during the day as needed. 08/05/20  Yes [provider]  potassium chloride SA (KLOR-CON M) 20 MEQ tablet Take 1 tablet (20 mEq total) by mouth daily. 10/10/22  Yes Marrianne Mood, MD  rivaroxaban (XARELTO) 20 MG TABS tablet Take 1 tablet (20 mg total) by mouth every morning. 02/13/22  Yes Merrilyn Puma, MD  torsemide (DEMADEX) 20 MG tablet Take 2 tablets by mouth daily, you may take an extra 1/2 tablet only as needed for  weight gain of 2 lbs overnight or 5 lbs in a week 10/25/22  Yes Juberg, Christopher, DO  alendronate (FOSAMAX) 70 MG tablet Take 1 tablet (70 mg total) by mouth every 7 (seven) days. Take with a full glass of water on an empty stomach. Patient not taking: Reported on 10/29/2022 10/25/22 10/25/23  Katheran James, DO  OXYGEN Inhale 2-3 L into the lungs daily.    [provider]  PRESCRIPTION MEDICATION Inhale into the lungs at bedtime. cpap    [provider]  Semaglutide-Weight Management (WEGOVY) 1 MG/0.5ML SOAJ Inject 1 mg into the skin once a week. Patient not taking: Reported on 10/29/2022 10/19/22   Marrianne Mood, MD    Inpatient Medications: Scheduled Meds:  arformoterol  15 mcg Nebulization BID   budesonide (PULMICORT) nebulizer solution  0.25 mg Nebulization BID   Chlorhexidine Gluconate Cloth  6 each Topical Q0600   docusate  100 mg Per Tube BID   enoxaparin (LOVENOX) injection  150 mg Subcutaneous BID   guaiFENesin  10 mL Per Tube BID   insulin aspart  0-15 Units Subcutaneous Q4H   mupirocin ointment  1 Application Nasal BID   mouth rinse  15 mL Mouth Rinse Q2H   oxyCODONE  5 mg Per Tube Q6H   pantoprazole (PROTONIX) IV  40 mg Intravenous QHS   polyethylene glycol  17 g Per Tube Daily   revefenacin  175 mcg Nebulization Daily   sodium chloride HYPERTONIC  4 mL Nebulization BID   Continuous Infusions:  azithromycin Stopped (10/29/22 2225)   cefTRIAXone (ROCEPHIN)  IV Stopped (10/29/22 2144)   fentaNYL infusion INTRAVENOUS 200 mcg/hr (10/30/22 1000)   norepinephrine (LEVOPHED) Adult infusion 5 mcg/min (10/30/22 1014)   propofol (DIPRIVAN) infusion Stopped (10/29/22 1511)   PRN Meds: docusate, fentaNYL, mouth rinse, polyethylene glycol  Allergies:   No Known Allergies  Social History:   Social History   Socioeconomic History   Marital status: Divorced    Spouse name: Not on file   Number of children: Not on file   Years of education: Not on file    Highest education level: Not on file  Occupational History   Not on file  Tobacco Use   Smoking status: Former    Current packs/day: 0.00    Average packs/day: 2.0 packs/day for 60.4 years (120.8 ttl pk-yrs)    Types: Cigarettes    Start date: 08/16/1961    Quit date:  01/02/2022    Years since quitting: 0.8   Smokeless tobacco: Never   Tobacco comments:    Pt smokes about 4 ciggs daily. Stopped 01/02/2022   Vaping Use   Vaping status: Never Used  Substance and Sexual Activity   Alcohol use: No   Drug use: No   Sexual activity: Not Currently    Partners: Male  Other Topics Concern   Not on file  Social History Narrative   Lives in [facility] in [place]. Lives alone, but is dependent in ADLs/IADLs. Previously had HH PT and RN but dismissed them with plans to use the [employer]. Two daughters live nearby. Previously resided in [place].   Social Determinants of Health   Financial Resource Strain: Medium Risk (12/02/2021)   Overall Financial Resource Strain (CARDIA)    Difficulty of Paying Living Expenses: Somewhat hard  Food Insecurity: No Food Insecurity (09/22/2022)   Hunger Vital Sign    Worried About Running Out of Food in the Last Year: Never true    Ran Out of Food in the Last Year: Never true  Transportation Needs: No Transportation Needs (09/22/2022)   PRAPARE - Administrator, Civil Service (Medical): No    Lack of Transportation (Non-Medical): No  Physical Activity: Inactive (12/02/2021)   Exercise Vital Sign    Days of Exercise per Week: 0 days    Minutes of Exercise per Session: 0 min  Stress: No Stress Concern Present (12/02/2021)   [employer] of Occupational Health - Occupational Stress Questionnaire    Feeling of Stress : Only a little  Social Connections: Moderately Integrated (03/15/2022)   Social Connection and Isolation Panel [NHANES]    Frequency of Communication with Friends and Family: More than three times a week    Frequency of  Social Gatherings with Friends and Family: More than three times a week    Attends Religious Services: More than 4 times per year    Active Member of [employer] or Organizations: Yes    Attends Engineer, structural: More than 4 times per year    Marital Status: Divorced  Intimate Partner Violence: Not At Risk (03/15/2022)   Humiliation, Afraid, Rape, and Kick questionnaire    Fear of Current or Ex-Partner: No    Emotionally Abused: No    Physically Abused: No    Sexually Abused: No    Family History:   Family History  Problem Relation Age of Onset   Breast cancer Mother        before age 71   Hypertension Mother    Hyperlipidemia Mother    Hypertension Maternal Grandfather    Hyperlipidemia Maternal Grandfather      ROS:  Please see the history of present illness.  Unable to obtain more information due to patient being intubated.  Physical Exam/Data:   Vitals:   10/30/22 0830 10/30/22 0900 10/30/22 0930 10/30/22 1000  BP: 110/63 (!) 73/50 (!) 78/54 (!) 75/55  Pulse: 90 79 72 68  Resp: 20 20 20 20   Temp:    99.1 F (37.3 C)  TempSrc:      SpO2: 95% 96% 97% 98%  Weight:      Height:        Intake/Output Summary (Last 24 hours) at 10/30/2022 1042 Last data filed at 10/30/2022 1000 Gross per 24 hour  Intake 1329.69 ml  Output 1000 ml  Net 329.69 ml      10/30/2022    5:00 AM 10/29/2022  4:34 AM 10/28/2022    9:49 PM  Last 3 Weights  Weight (lbs) 367 lb 15.2 oz 367 lb 1.1 oz 360 lb  Weight (kg) 166.9 kg 166.5 kg 163.295 kg     Body mass index is 47.24 kg/m.  General: Chronically ill-appearing morbidly obese female, sedated on the vent  HEENT: normal for age Neck: no JVD seen, difficult to assess secondary to body habitus Vascular: No carotid bruits; Distal pulses 2+ bilaterally Cardiac:  normal S1, S2; RRR; no murmur  Lungs:  clear to auscultation anteriorly, no wheezing, rhonchi or rales  Abd: soft, nontender, no hepatomegaly  Ext: no  edema Musculoskeletal:  No deformities, BUE and BLE strength not tested Skin: warm and dry  Neuro: Not able to assess Psych:  Not able to assess  EKG:  The EKG was personally reviewed and demonstrates: 7/27 ECG was atrial fibs, heart rate 77 Telemetry:  Telemetry was personally reviewed and demonstrates: Atrial fibrillation, variable rate, pauses up to 6 seconds and tachycardia up to the 140s within the last 12 hours  Relevant CV Studies:  ECHO: 10/29/2022  1. LV septum is aneurysmal towards the apex. Left ventricular ejection fraction, by estimation, is <20%. The left ventricle has severely decreased function. The left ventricle demonstrates global hypokinesis.  There is mild left ventricular hypertrophy.  Left ventricular diastolic parameters are indeterminate.   2. Right ventricular systolic function is normal. The right ventricular size is normal. There is normal pulmonary artery systolic pressure.   3. Left atrial size was moderately dilated.   4. The mitral valve is normal in structure. No evidence of mitral valve regurgitation.   5. The aortic valve is tricuspid. Aortic valve regurgitation is not visualized.   6. There is mild dilatation of the ascending aorta, measuring 41 mm.   7. The inferior vena cava is dilated in size with <50% respiratory variability, suggesting right atrial pressure of 15 mmHg.    Laboratory Data:  High Sensitivity Troponin:   Recent Labs  Lab 10/28/22 2210 10/29/22 0020  TROPONINIHS 20* 140*     Chemistry Recent Labs  Lab 10/28/22 2210 10/28/22 2236 10/29/22 0020 10/29/22 0107 10/29/22 0422 10/30/22 0101  NA 141   < >  --  141 137 139  K 3.8   < >  --  4.1 4.3 4.5  CL 103  --   --   --  103 105  CO2 19*  --   --   --  21* 23  GLUCOSE 267*  --   --   --  239* 132*  BUN 13  --   --   --  15 22  CREATININE 1.16*  --  1.26*  --  1.40* 1.36*  CALCIUM 9.2  --   --   --  8.5* 8.8*  MG  --   --   --   --  2.3 1.9  GFRNONAA 50*  --  45*  --   40* 41*  ANIONGAP 19*  --   --   --  13 11   < > = values in this interval not displayed.    Recent Labs  Lab 10/28/22 2210 10/29/22 0422 10/30/22 0101  PROT 7.6 7.0 6.1*  ALBUMIN 3.8 3.5 3.0*  AST 26 40 23  ALT 21 29 25   ALKPHOS 112 111 81  BILITOT 0.4 1.2 0.3   Lipids  Recent Labs  Lab 10/29/22 0423  TRIG 170*    Hematology Recent Labs  Lab  10/29/22 0020 10/29/22 0107 10/29/22 0422 10/30/22 0101  WBC 14.8*  --  13.1* 11.1*  RBC 4.67  --  4.85 4.22  HGB 12.8 13.3 14.0 11.6*  HCT 40.4 39.0 41.6 35.6*  MCV 86.5  --  85.8 84.4  MCH 27.4  --  28.9 27.5  MCHC 31.7  --  33.7 32.6  RDW 13.9  --  13.9 14.6  PLT 676*  --  733* 419*   Thyroid No results for input(s): "TSH", "FREET4" in the last 168 hours.  BNP Recent Labs  Lab 10/28/22 2210  BNP 630.2*    DDimer No results for input(s): "DDIMER" in the last 168 hours.   Radiology/Studies:  DG CHEST PORT 1 VIEW  Result Date: 10/30/2022 CLINICAL DATA:  Respiratory failure EXAM: PORTABLE CHEST 1 VIEW COMPARISON:  Chest x-ray dated October 29, 2022 FINDINGS: Stable position of ETT, enteric tube and left central venous line. Unchanged cardiomegaly. Increased diffuse interstitial opacities. No large pleural effusion or evidence of pneumothorax. IMPRESSION: Increased diffuse interstitial opacities, likely due to worsened pulmonary edema. Electronically Signed   By: Allegra Lai M.D.   On: 10/30/2022 10:27   ECHOCARDIOGRAM COMPLETE  Result Date: 10/29/2022    ECHOCARDIOGRAM REPORT   Patient Name:   AWBREE HOFFER Date of Exam: 10/29/2022 Medical Rec #:  [id number]      Height:       74.0 in Accession #:    [id number]     Weight:       367.1 lb Date of Birth:  Dec 15, 1950      BSA:          2.813 m Patient Age:    72 years       BP:           111/90 mmHg Patient Gender: F              HR:           78 bpm. Exam Location:  Inpatient Procedure: 2D Echo, Color Doppler, Cardiac Doppler and Intracardiac            Opacification Agent  Indications:    Congestive Heart Failure I50.9  History:        Patient has prior history of Echocardiogram examinations, most                 recent 04/27/2022. CHF, COPD, Stroke and Breast Cancer,                 Arrythmias:Atrial Fibrillation; Risk Factors:Former Smoker,                 Hypertension and Diabetes.  Sonographer:    Aron Baba Referring Phys: [id number] Lorin Glass  Sonographer Comments: Technically difficult study due to poor echo windows, patient is obese and echo performed with patient supine and on artificial respirator. IMPRESSIONS  1. LV septum is aneurysmal towards the apex. Left ventricular ejection fraction, by estimation, is <20%. The left ventricle has severely decreased function. The left ventricle demonstrates global hypokinesis. There is mild left ventricular hypertrophy. Left ventricular diastolic parameters are indeterminate.  2. Right ventricular systolic function is normal. The right ventricular size is normal. There is normal pulmonary artery systolic pressure.  3. Left atrial size was moderately dilated.  4. The mitral valve is normal in structure. No evidence of mitral valve regurgitation.  5. The aortic valve is tricuspid. Aortic valve regurgitation is not visualized.  6. There is mild dilatation of the ascending  aorta, measuring 41 mm.  7. The inferior vena cava is dilated in size with <50% respiratory variability, suggesting right atrial pressure of 15 mmHg. FINDINGS  Left Ventricle: LV septum is aneurysmal towards the apex. Left ventricular ejection fraction, by estimation, is <20%. The left ventricle has severely decreased function. The left ventricle demonstrates global hypokinesis. Definity contrast agent was given IV to delineate the left ventricular endocardial borders. The left ventricular internal cavity size was normal in size. There is mild left ventricular hypertrophy. Left ventricular diastolic parameters are indeterminate. Right Ventricle: The right ventricular  size is normal. Right ventricular systolic function is normal. There is normal pulmonary artery systolic pressure. The tricuspid regurgitant velocity is 1.95 m/s, and with an assumed right atrial pressure of 15 mmHg, the estimated right ventricular systolic pressure is 30.2 mmHg. Left Atrium: Left atrial size was moderately dilated. Right Atrium: Right atrial size was normal in size. Pericardium: There is no evidence of pericardial effusion. Mitral Valve: The mitral valve is normal in structure. No evidence of mitral valve regurgitation. Tricuspid Valve: Tricuspid valve regurgitation is not demonstrated. Aortic Valve: The aortic valve is tricuspid. Aortic valve regurgitation is not visualized. Pulmonic Valve: Pulmonic valve regurgitation is not visualized. Aorta: There is mild dilatation of the ascending aorta, measuring 41 mm. Venous: The inferior vena cava is dilated in size with less than 50% respiratory variability, suggesting right atrial pressure of 15 mmHg. IAS/Shunts: No atrial level shunt detected by color flow Doppler.  LEFT VENTRICLE PLAX 2D LVIDd:         5.90 cm   Diastology LVIDs:         5.60 cm   LV e' medial:    4.22 cm/s LV PW:         1.40 cm   LV E/e' medial:  22.9 LV IVS:        1.10 cm   LV e' lateral:   4.45 cm/s LVOT diam:     2.20 cm   LV E/e' lateral: 21.7 LV SV:         48 LV SV Index:   17 LVOT Area:     3.80 cm  RIGHT VENTRICLE RV S prime:     10.80 cm/s TAPSE (M-mode): 1.0 cm LEFT ATRIUM            Index        RIGHT ATRIUM           Index LA diam:      5.10 cm  1.81 cm/m   RA Area:     16.20 cm LA Vol (A2C): 81.3 ml  28.90 ml/m  RA Volume:   38.90 ml  13.83 ml/m LA Vol (A4C): 121.0 ml 43.01 ml/m  AORTIC VALVE LVOT Vmax:   84.20 cm/s LVOT Vmean:  48.900 cm/s LVOT VTI:    0.127 m  AORTA Ao Root diam: 3.40 cm Ao Asc diam:  4.10 cm MITRAL VALVE               TRICUSPID VALVE MV Area (PHT): 5.66 cm    TR Peak grad:   15.2 mmHg MV Decel Time: 134 msec    TR Vmax:        195.00 cm/s MR  Peak grad: 23.2 mmHg MR Vmax:      241.00 cm/s  SHUNTS MV E velocity: 96.60 cm/s  Systemic VTI:  0.13 m MV A velocity: 24.00 cm/s  Systemic Diam: 2.20 cm MV E/A ratio:  4.03 Photographer  signed by Carolan Clines Signature Date/Time: 10/29/2022/1:12:54 PM    Final    DG Chest Port 1 View  Result Date: 10/29/2022 CLINICAL DATA:  [id number]. History of ETT. Ventilator dependent respiratory failure. EXAM: PORTABLE CHEST 1 VIEW COMPARISON:  Portable chest yesterday at 11:46 p.m. FINDINGS: 5:02 a.m. ETT tip is 3.8 cm from carina. NGT does enter the stomach, but the side-hole is at the EG junction and needs to be advanced further in. Left IJ line again terminates horizontally at the brachiocephalic/SVC junction. Multiple overlying monitor wires. The heart is moderately enlarged. There is mild central vascular prominence with improvement. There previously was perihilar interstitial edema which has markedly improved. Small pleural effusions appear similar. There are still patchy airspace infiltrates in the left lower lung field but they are less confluent and less dense, consistent with either clearing edema or improving pneumonia. Remainder of the lungs appear generally clear now. There is no new or worsening finding. Mediastinum is stable with aortic tortuosity and ectasia. Atherosclerosis. Degenerative changes. IMPRESSION: 1. ETT tip 3.8 cm from carina. 2. NGT side-hole is at the EG junction and needs to be advanced further in. 3. Left IJ line again terminates horizontally at the brachiocephalic/SVC junction. 4. Marked improvement in perihilar interstitial edema, nearly resolved. 5. Persistent patchy airspace infiltrates in the left lower lung field, less confluent and less dense, consistent with clearing edema or improving pneumonia. 6. Similar small pleural effusions. Electronically Signed   By: Almira Bar M.D.   On: 10/29/2022 05:21   US RENAL  Result Date: 10/29/2022 CLINICAL DATA:  Acute kidney  injury EXAM: RENAL / URINARY TRACT ULTRASOUND COMPLETE COMPARISON:  Ultrasound 01/08/2022 FINDINGS: Right Kidney: Renal measurements: 10.2 x 5 x 5.4 cm = volume: 143.4 mL. Echogenicity appears slightly increased. No hydronephrosis. Lobulated cyst at the midpole measuring 3.6 by 6.5 x 3.5 cm, previously 4.9 x 5.2 x 4.6 cm. Left Kidney: Renal measurements: 14.9 x 4.7 x 5.3 cm = volume: 191.2 mL. Echogenicity within normal limits. No mass or hydronephrosis visualized. Bladder: Not well seen, decompressed by Foley catheter. Other: None. IMPRESSION: 1. Negative for hydronephrosis. Slightly echogenic right kidney suggesting medical renal disease 2. Lobulated minimally complex right renal cyst containing thin septation, slightly increased in size. More definitive characterization previously suggested with renal CT or MRI protocol Electronically Signed   By: Jasmine Pang M.D.   On: 10/29/2022 00:45   DG Chest Portable 1 View  Result Date: 10/29/2022 CLINICAL DATA:  CVC placement EXAM: PORTABLE CHEST 1 VIEW COMPARISON:  Chest radiograph 10/28/2022 at 10:20 p.m. FINDINGS: New left IJ CVC tip likely at the venous confluence of the left brachiocephalic and superior vena cava. ETT tip in the intrathoracic trachea 4.3 cm from the carina. Subdiaphragmatic enteric. Unchanged cardiomegaly and bilateral hazy airspace and interstitial opacities. Small bilateral pleural effusions. No pneumothorax. IMPRESSION: 1. New left IJ CVC tip likely at the venous confluence of the left brachiocephalic and superior vena cava. 2. Unchanged CHF. Electronically Signed   By: Minerva Fester M.D.   On: 10/29/2022 00:09   DG Chest Portable 1 View  Result Date: 10/28/2022 CLINICAL DATA:  Intubated EXAM: PORTABLE CHEST 1 VIEW COMPARISON:  10/28/2022 FINDINGS: Single frontal view of the chest demonstrates endotracheal tube overlying tracheal air column, tip at thoracic inlet. Enteric catheter passes below diaphragm tip excluded by collimation.  Stable enlarged cardiac silhouette, with bilateral perihilar airspace disease compatible with edema. No large effusion or pneumothorax. No acute bony abnormalities. IMPRESSION: 1. No complication after  intubation. 2. Congestive heart failure and marked pulmonary edema, unchanged. Electronically Signed   By: Sharlet Salina M.D.   On: 10/28/2022 22:41   DG Abdomen 1 View  Result Date: 10/28/2022 CLINICAL DATA:  OG tube placement EXAM: ABDOMEN - 1 VIEW COMPARISON:  None Available. FINDINGS: Enteric tube tip and side-port in the stomach IMPRESSION: Enteric tube tip and side-port in the stomach. Electronically Signed   By: Minerva Fester M.D.   On: 10/28/2022 22:41   DG Chest Portable 1 View  Result Date: 10/28/2022 CLINICAL DATA:  ETT placement EXAM: PORTABLE CHEST 1 VIEW COMPARISON:  Comparison is made with chest radiograph 10/05/2022 and subsequent chest radiograph on 10/28/2022 at 10:20 p.m. FINDINGS: Endotracheal tube tip in the mid intrathoracic trachea 4.5 cm from the carina. Subdiaphragmatic enteric tube. Patchy bilateral airspace and interstitial opacities greatest in the mid and lower lungs. Cardiomegaly. Probable pleural effusions. No definite pneumothorax. IMPRESSION: Endotracheal tube tip in the upper intrathoracic trachea. Cardiomegaly and pulmonary edema. Electronically Signed   By: Minerva Fester M.D.   On: 10/28/2022 22:41     Assessment and Plan:   Newly diagnosed left ventricular dysfunction: -Her EF was 60-65 in January 2024. -Her EF is now less than 20%.  It was listed as global hypokinesis, no wall motion abnormalities mentioned.  However, the images are poor even with Definity contrast. -Her hypotension makes [place] unable to use standard of care medical therapy, hopefully we can add medications as her general medical condition improves -Discuss with MD if we should try adding dig now - Could be stress CM, but she has mult CRFs and had mod LAD and RCA dz with Calcium score 2110 in  2020. - will need ischemic eval once gen med condition improves  2.  Cardiac arrest -She had a brief cardiac arrest this a.m. entirely related to hypoxia -After her endotracheal tube got a mucous plug, she became cyanotic, hypoxic, and then her heart rate dropped.   - She did require a few minutes of CPR, but no drugs were used. -Once the mucous plug was removed from her endotracheal tube, her condition improved  3.  Acute on chronic hypoxic respiratory failure -Management per CCM -Bronch this morning showed purulent secretions and mucous plugs, treatment is ongoing   Risk Assessment/Risk Scores:        New York Heart Association (NYHA) Functional Class NYHA Class IV  CHA2DS2-VASc Score = 8   This indicates a 10.8% annual risk of stroke. The patient's score is based upon: CHF History: 1 HTN History: 1 Diabetes History: 1 Stroke History: 2 Vascular Disease History: 1 Age Score: 1 Gender Score: 1    For questions or updates, please contact [facility] Please consult [email or web address] for contact info under    Signed, Theodore Demark, PA-C  10/30/2022 10:42 AM  Patient seen and examined with Theodore Demark, PA-C.  Agree as above, with the following exceptions and changes as noted below.  Patient is a 72 year old female with a history of HFpEF and A-fib, COPD and chronic respiratory failure morbid obesity, diabetes, hypertension, hyperlipidemia, history of DVT PE, OSA, CVA.  Cardiology consulted for severely reduced ejection fraction.  Patient is currently intubated and sedated on 2 M15 ICU.  I evaluated the patient with her daughter Bronson Ing at the bedside who provides the history as the patient is sedated.  As above, patient was noted to feel poorly [place], noted to have significant hypoxemia and intubated on  presentation to the ED.  Though she has a history of PE, she has been on Xarelto and she has not yet been scanned for PE given low  likelihood with chronic anticoagulation.  Known severe OSA, though patient's home CPAP machine has not been working therefore she has not been using it recently.  Currently on blood pressure support with norepinephrine.  Noted to be in atrial fibrillation, with rapid ventricular response requiring amiodarone infusion, overnight telemetry suggests bradycardia and ventricular standstill with pauses, amiodarone discontinued.  Early this morning, patient developed a mucous plug in her ET tube and became hypoxic with PEA arrest requiring brief CPR and bronchoscopy performed with return of purulent fluid.  Respiratory therapist is present in the room at the time of my consultation and notes that the patient is currently doing well on the ventilator sedated only with fentanyl.  . Gen: Intubated and sedated, CV: Irregular rhythm normal rate, no murmurs lungs: Ventilator breath sounds, Abd: soft, Extrem: Warm, no edema, Neuro/Psych: Intubated and sedated. All available labs, radiology testing, previous records reviewed.   Lactate is downtrending from 4.2 > 2.4.  Co. oximetry shows mixed venous sat of 75, less suggestive of a low output heart failure.  CO2 23, creatinine 1.36.  Patient's echocardiogram independently reviewed with evidence of EF approximately 20% with dense regional wall motion abnormalities in the LAD distribution.  Best preserved function in the basal segments.  Chest CT from earlier this year also reviewed with evidence of severe three-vessel and left main coronary artery calcifications.  Patient has had a coronary CT performed in 2020 with evidence of small nondominant RCA obstructive disease but otherwise nonobstructive CAD.  She was noted at that time to have mild left main stenosis and moderate LAD stenosis.  -Given the patient's echocardiogram cannot exclude LAD infarct though EKG has no significant ischemic changes and troponin was 20 on presentation in extremis and rose only to 140.  This  makes ACS less likely.  The most likely scenario is stress cardiomyopathy from respiratory decompensation.  Patient warrants a coronary angiogram when she has been stabilized later in this admission.  For now medically manage coronary artery disease with ongoing heparin.  No room for guideline directed medical therapy in the setting of recent arrest and current use of norepinephrine, will follow to add as she stabilizes.  Parke Poisson, MD 10/30/22 11:29 AM  CRITICAL CARE Performed by: Weston Brass, MD   Total critical care time: 40 minutes   Critical care time was exclusive of separately billable procedures and treating other patients.   Critical care was necessary to treat or prevent imminent or life-threatening deterioration.   Critical care was time spent personally by me (independent of APPs or residents) on the following activities: development of treatment plan with patient and/or surrogate as well as nursing, discussions with consultants, evaluation of patient's response to treatment, examination of patient, obtaining history from patient or surrogate, ordering and performing treatments and interventions, ordering and review of laboratory studies, ordering and review of radiographic studies, pulse oximetry and re-evaluation of patient's condition.

## 2022-10-31 DIAGNOSIS — J9601 Acute respiratory failure with hypoxia: Secondary | ICD-10-CM | POA: Diagnosis not present

## 2022-10-31 DIAGNOSIS — I5041 Acute combined systolic (congestive) and diastolic (congestive) heart failure: Secondary | ICD-10-CM | POA: Diagnosis not present

## 2022-10-31 LAB — GLUCOSE, CAPILLARY
Glucose-Capillary: 100 mg/dL — ABNORMAL HIGH (ref 70–99)
Glucose-Capillary: 114 mg/dL — ABNORMAL HIGH (ref 70–99)
Glucose-Capillary: 117 mg/dL — ABNORMAL HIGH (ref 70–99)
Glucose-Capillary: 120 mg/dL — ABNORMAL HIGH (ref 70–99)
Glucose-Capillary: 126 mg/dL — ABNORMAL HIGH (ref 70–99)
Glucose-Capillary: 128 mg/dL — ABNORMAL HIGH (ref 70–99)
Glucose-Capillary: 144 mg/dL — ABNORMAL HIGH (ref 70–99)

## 2022-10-31 LAB — URINE CULTURE: Culture: >=100000 — AB

## 2022-10-31 MED ORDER — ORAL CARE MOUTH RINSE: 15.0000 mL | OROMUCOSAL | Status: DC | PRN

## 2022-10-31 MED ORDER — PIPERACILLIN-TAZOBACTAM 3.375 G IVPB
3.3750 g | Freq: Three times a day (TID) | INTRAVENOUS | Status: DC
  Administered 2022-10-31 – 2022-11-02 (×6): 3.375 g via INTRAVENOUS
  Filled 2022-10-31 (×10): qty 50

## 2022-10-31 MED ORDER — ACETAMINOPHEN 325 MG PO TABS: 650.0000 mg | ORAL_TABLET | ORAL | Status: DC | PRN

## 2022-10-31 MED ORDER — HYDRALAZINE HCL 20 MG/ML IJ SOLN
5.0000 mg | INTRAMUSCULAR | Status: DC | PRN
  Administered 2022-10-31: 5 mg via INTRAVENOUS
  Filled 2022-10-31: qty 1

## 2022-10-31 MED ORDER — DOCUSATE SODIUM 100 MG PO CAPS
100.0000 mg | ORAL_CAPSULE | Freq: Two times a day (BID) | ORAL | Status: DC
  Administered 2022-11-01: 100 mg via ORAL
  Filled 2022-10-31 (×5): qty 1

## 2022-10-31 MED ORDER — LABETALOL HCL 5 MG/ML IV SOLN
10.0000 mg | INTRAVENOUS | Status: DC | PRN
  Administered 2022-10-31 – 2022-11-01 (×2): 10 mg via INTRAVENOUS
  Filled 2022-10-31 (×2): qty 4

## 2022-10-31 MED ORDER — ASPIRIN 81 MG PO TBEC
81.0000 mg | DELAYED_RELEASE_TABLET | Freq: Every day | ORAL | Status: DC
  Administered 2022-11-01 – 2022-11-06 (×6): 81 mg via ORAL
  Filled 2022-10-31 (×9): qty 1

## 2022-10-31 MED ORDER — ORAL CARE MOUTH RINSE
15.0000 mL | OROMUCOSAL | Status: DC
  Administered 2022-10-31 – 2022-11-06 (×5): 15 mL via OROMUCOSAL

## 2022-10-31 MED ORDER — GUAIFENESIN 100 MG/5ML PO LIQD
10.0000 mL | Freq: Two times a day (BID) | ORAL | Status: DC
  Administered 2022-11-01 – 2022-11-06 (×11): 10 mL via ORAL
  Filled 2022-10-31 (×11): qty 10

## 2022-10-31 MED ORDER — DEXMEDETOMIDINE HCL IN NACL 400 MCG/100ML IV SOLN
0.0000 ug/kg/h | INTRAVENOUS | Status: DC
  Administered 2022-10-31: 0.4 ug/kg/h via INTRAVENOUS
  Filled 2022-10-31: qty 100

## 2022-10-31 MED ORDER — ALBUTEROL SULFATE (2.5 MG/3ML) 0.083% IN NEBU
INHALATION_SOLUTION | RESPIRATORY_TRACT | Status: AC
  Filled 2022-10-31: qty 3

## 2022-10-31 MED ORDER — POLYETHYLENE GLYCOL 3350 17 G PO PACK
17.0000 g | PACK | Freq: Every day | ORAL | Status: DC
  Administered 2022-11-01: 17 g via ORAL
  Filled 2022-10-31 (×2): qty 1

## 2022-10-31 MED ORDER — FUROSEMIDE 10 MG/ML IJ SOLN
60.0000 mg | Freq: Four times a day (QID) | INTRAMUSCULAR | Status: AC
  Administered 2022-10-31 – 2022-11-01 (×4): 60 mg via INTRAVENOUS
  Filled 2022-10-31 (×4): qty 6

## 2022-10-31 MED ORDER — ALBUTEROL SULFATE (2.5 MG/3ML) 0.083% IN NEBU: 2.5000 mg | INHALATION_SOLUTION | Freq: Four times a day (QID) | RESPIRATORY_TRACT | Status: DC | PRN

## 2022-10-31 MED ORDER — OXYCODONE HCL 5 MG PO TABS
5.0000 mg | ORAL_TABLET | Freq: Four times a day (QID) | ORAL | Status: DC
  Administered 2022-11-01 – 2022-11-06 (×19): 5 mg via ORAL
  Filled 2022-10-31 (×20): qty 1

## 2022-10-31 MED ORDER — LABETALOL HCL 5 MG/ML IV SOLN
10.0000 mg | Freq: Once | INTRAVENOUS | Status: AC
  Administered 2022-10-31: 10 mg via INTRAVENOUS
  Filled 2022-10-31: qty 4

## 2022-10-31 MED ORDER — ALBUMIN HUMAN 25 % IV SOLN
25.0000 g | Freq: Four times a day (QID) | INTRAVENOUS | Status: AC
  Administered 2022-10-31 – 2022-11-01 (×4): 25 g via INTRAVENOUS
  Filled 2022-10-31 (×3): qty 100

## 2022-10-31 MED ORDER — ACETAMINOPHEN 650 MG RE SUPP
650.0000 mg | RECTAL | Status: DC | PRN
  Administered 2022-10-31: 650 mg via RECTAL

## 2022-10-31 MED ORDER — POLYETHYLENE GLYCOL 3350 17 G PO PACK: 17.0000 g | PACK | Freq: Every day | ORAL | Status: DC | PRN

## 2022-10-31 NOTE — Procedures (Signed)
Extubation Procedure Note  Patient Details:   Name: ZITLALY ABRAHAM DOB: 1950-04-18 [name]: [id number]   Airway Documentation:    Vent end date: 10/31/22 Vent end time: 1050   Evaluation  O2 sats: stable throughout Complications: No apparent complications Patient did tolerate procedure well. Bilateral Breath Sounds: Clear, Diminished   No Pt extubated to bipap per MD order with RT and RN at bedside. VS was stable throughout without any complication. Positive cuff leak prior to extubation and no stridor is noted at this time. RT will continue to monitor.  Nelda Marseille Darey Hershberger 10/31/2022, 11:01 AM

## 2022-10-31 NOTE — Evaluation (Signed)
Physical Therapy Evaluation Patient Details Name: Sharon Vasquez [name]: [id number] DOB: [birth year] Today's Date: 10/31/2022  History of Present Illness  72 yo female admitted 7/27 with respiratory distress requiring intubation with severe HFrEF EF<20%. 7/29 mucus plug with respiratory and cardiac arrest. 7/30 extubation to bipap. PMhx; HFpEF, OSA, ILD, CHF, COPD, obesity, HTN, Afib, HLD, anxiety  Clinical Impression  At baseline pt lives alone, transfers from [facility] to bed and utilizes purewick and family/friend assist for IADLs. Per family report pt needs more assist at home and pt is not eager to pursue SNF. Pt currently on bipap with VSS and struggling to get to EOB with max assist and +2 assist to return to bed. Pt currently unable to transfer OOB and demonstrates decreased strength, function, mobility and cardiopulmonary function who will benefit from acute therapy to maximize mobility, strength and safety. Encouraged movement in bed and assist with transfers.   SPO2 98% on Bipap, Fio2 50%, peep 5 HR 91-100      If plan is discharge home, recommend the following: Two people to help with walking and/or transfers;Two people to help with bathing/dressing/bathroom;Assist for transportation;Help with stairs or ramp for entrance;Assistance with cooking/housework   Can travel by private vehicle   No    Equipment Recommendations Other (comment) (hoyer lift)  Recommendations for Other Services  OT consult    Functional Status Assessment Patient has had a recent decline in their functional status and/or demonstrates limited ability to make significant improvements in function in a reasonable and predictable amount of time     Precautions / Restrictions Precautions Precautions: Fall;Other (comment) Precaution Comments: watch sats      Mobility  Bed Mobility Overal bed mobility: Needs Assistance Bed Mobility: Supine to Sit, Sit to Supine     Supine to sit: HOB elevated, Max assist Sit to  supine: Max assist, +2 for physical assistance   General bed mobility comments: max assist of LB to pivot to EOB with HOB 35 degrees and min assist to rotate and elevate trunk fully from surface, increased time. Sit to supine with max +2 assist to pivot back to bed and total +2 to slide toward HOB. Pt denied attempting to scoot toward HOB at EOB    Transfers                   General transfer comment: unable    Ambulation/Gait                  Stairs            Wheelchair Mobility     Tilt Bed    Modified Rankin (Stroke Patients Only)       Balance Overall balance assessment: Needs assistance Sitting-balance support: Feet supported, No upper extremity supported Sitting balance-Leahy Scale: Fair Sitting balance - Comments: EOB without support                                     Pertinent Vitals/Pain Pain Assessment Pain Assessment: No/denies pain    Home Living Family/patient expects to be discharged to:: Private residence Living Arrangements: Alone Available Help at Discharge: Friend(s);Available PRN/intermittently;Family Type of Home: Apartment Home Access: Elevator       Home Layout: One level Home Equipment: Wheelchair - Engineer, technical sales - power;BSC/3in1;Rollator (4 wheels);Other (comment);Hospital bed Additional Comments: pt on bipap and daughter as well as neighbor providing home setup and PLOF  Prior Function Prior Level of Function : Needs assist       Physical Assist : ADLs (physical)   ADLs (physical): Bathing;IADLs Mobility Comments: transfers from hospital bed <>WC with lateral scoot/ squat pivot independently ADLs Comments: uses purewick, neighbor and family assist with sponge bathes. family get the groceries and assist with meals. attempts to perform pericare but isn't always complete     Hand Dominance        Extremity/Trunk Assessment   Upper Extremity Assessment Upper Extremity Assessment:  Generalized weakness    Lower Extremity Assessment Lower Extremity Assessment: RLE deficits/detail;LLE deficits/detail RLE Deficits / Details: edema, unna boots, baseline knee pain with strength 1/5 needing assist to move legs off of and onto bed LLE Deficits / Details: edema, unna boots, baseline knee pain with strength 1/5 needing assist to move legs off of and onto bed    Cervical / Trunk Assessment Cervical / Trunk Assessment: Other exceptions Cervical / Trunk Exceptions: increased body habitus  Communication   Communication: Other (comment) (bipap)  Cognition Arousal/Alertness: Awake/alert Behavior During Therapy: Flat affect Overall Cognitive Status: Difficult to assess                                          General Comments      Exercises     Assessment/Plan    PT Assessment Patient needs continued PT services  PT Problem List Decreased strength;Decreased range of motion;Decreased activity tolerance;Decreased balance;Decreased mobility;Decreased knowledge of use of DME;Obesity       PT Treatment Interventions DME instruction;Therapeutic exercise;Functional mobility training;Therapeutic activities;Patient/family education    PT Goals (Current goals can be found in the Care Plan section)  Acute Rehab PT Goals Patient Stated Goal: return home PT Goal Formulation: With patient/family Time For Goal Achievement: 11/14/22 Potential to Achieve Goals: Fair    Frequency Min 1X/week     Co-evaluation               AM-PAC PT "6 Clicks" Mobility  Outcome Measure Help needed turning from your back to your side while in a flat bed without using bedrails?: A Lot Help needed moving from lying on your back to sitting on the side of a flat bed without using bedrails?: Total Help needed moving to and from a bed to a chair (including a wheelchair)?: Total Help needed standing up from a chair using your arms (e.g., wheelchair or bedside chair)?:  Total Help needed to walk in hospital room?: Total Help needed climbing 3-5 steps with a railing? : Total 6 Click Score: 7    End of Session Equipment Utilized During Treatment: Oxygen (bipap) Activity Tolerance: Patient tolerated treatment well Patient left: in bed;with call bell/phone within reach;with family/visitor present Nurse Communication: Mobility status;Need for lift equipment PT Visit Diagnosis: Other abnormalities of gait and mobility (R26.89);Muscle weakness (generalized) (M62.81)    Time: 0981-1914 PT Time Calculation (min) (ACUTE ONLY): 27 min   Charges:   PT Evaluation $PT Eval High Complexity: 1 High PT Treatments $Therapeutic Activity: 8-22 mins PT General Charges $$ ACUTE PT VISIT: 1 Visit         Merryl Hacker, PT Acute Rehabilitation Services Office: [phone]   Baani Bober B Larsen Zettel 10/31/2022, 1:19 PM

## 2022-10-31 NOTE — Progress Notes (Signed)
NAME:  Sharon Vasquez, [name]:  [id number], DOB:  05-18-1950, LOS: 3 ADMISSION DATE:  10/28/2022  History of Present Illness:  72 year old female with chronic HFpEF, OSA and CPAP nonadherence, ILD admitted for acute hypoxic and hypercarbic respiratory failure requiring intubation mechanical ventilation, found to have new severe HFrEF.  Significant Hospital Events:  7/27 admit, ETT, CVL 7/29 cardiac arrest due to respiratory arrest from mucus plug 7/30 extubated  Interim History / Subjective:  No chest pain today.  Some agitation and hypertension. Dexmedetomidine started at low dose.  Objective   Blood pressure (!) 178/108, pulse 80, temperature 99.7 F (37.6 C), resp. rate 19, height 6\' 2"  (1.88 m), weight (!) 167.9 kg, SpO2 99%. CVP:  [3 mmHg-11 mmHg] 11 mmHg  Vent Mode: PRVC FiO2 (%):  [40 %] 40 % Set Rate:  [20 bmp] 20 bmp Vt Set:  [951 mL] 620 mL PEEP:  [5 cmH20] 5 cmH20 Plateau Pressure:  [22 cmH20-24 cmH20] 24 cmH20   Intake/Output Summary (Last 24 hours) at 10/31/2022 1034 Last data filed at 10/31/2022 0600 Gross per 24 hour  Intake 1152.1 ml  Output 325 ml  Net 827.1 ml   Filed Weights   10/29/22 0434 10/30/22 0500 10/31/22 0500  Weight: (!) 166.5 kg (!) 166.9 kg (!) 167.9 kg    Examination: Mild distress, somewhat agitated ETT in place Heart rate tachycardic, rhythm regular, strong pulses warm extremities Breathing 20-30 times a minute on pressure support, inspiratory tidal volumes around 600, scant expiratory wheeze Abdomen nontender Skin warm and dry Foley in place draining dilute urine Alert, follows simple instructions  Resolved Hospital Problem list   Resolved Problems:   * No resolved hospital problems. *   Assessment & Plan:  Principal Problem:   CHF (congestive heart failure) (HCC) Active Problems:   Acute respiratory failure with hypoxia (HCC)   Acute pulmonary edema (HCC)   Cardiac arrest (HCC)  Acute on chronic hypoxemic and hypercarbic  respiratory failure Baseline home oxygen dependence Morbid obesity  -Probably pulmonary edema in setting of new HFrEF -Without obvious OHS given normal serum bicarb in past BMP -Severe OSA, CPAP machine not working -Rapid improvement in x-ray suggests that current worsening was related to pulmonary edema -arformoterol, revefenacin, budesonide via nebulizer -Finish course of ceftriaxone/azithromycin for empiric treatment of CAP   Mucus plugging, improved Respiratory arrest Aggressive pulmonary toilet. Scant thin secretions from inline suction this morning. -Guaifenesin per tube BID   HFrEF CAD -Newly reduced LVEF <20% with regional wall motion abnormalities -Coox 75% -May be able to start adding GDMT tomorrow -appreciate cardiology input, anticipate ischemic workup once she stabilizes   Acute kidney injury  -In setting of HFrEF -Follow urine output and BMET -Furosemide 60 mg q6h + albumin -Seems to be diuresing better today, lots of dilute urine in foley bag this morning -Renal ultrasound negative for obstruction    Afib/RVR -Rate controlled currently -Treatment-dose Lovenox -Amio held due to sinus pauses overnight   Shock, resolved In setting of induction for intubation. Low procalcitonin reassuring that there is no infection, doubt septic shock. Today quite hypertensive, MAP as high as 120. -S/p labetalol 10 mg IV, BP ~120/80 now -Consider restarting losartan tomorrow -GDMT as tolerated   Sedation needs on vent Chronic opiate use  DC propofol, use fentanyl drip with goal RASS 0 to -1 -Low-dose precedex PRN -Home oxycodone 5 mg q6h    DM2 -SSI  Best practice (daily eval):  Diet/type: tubefeeds DVT prophylaxis: systemic dose LMWH GI prophylaxis:  PPI Lines: Central line Foley:  Yes, and it is still needed Code Status:  full code  Marrianne Mood MD 10/31/2022, 10:34 AM  Pager: [phone]

## 2022-10-31 NOTE — Progress Notes (Signed)
Patient in no respiratory distress at this time. Bipap in room on standby.

## 2022-11-01 ENCOUNTER — Inpatient Hospital Stay (HOSPITAL_COMMUNITY)
Admission: EM | Disposition: A | Discharge status: Skilled Nursing Facility | Payer: Self-pay | Source: Home / Self Care | Attending: Internal Medicine

## 2022-11-01 ENCOUNTER — Encounter: Payer: Medicare HMO | Admitting: Student

## 2022-11-01 DIAGNOSIS — I5021 Acute systolic (congestive) heart failure: Secondary | ICD-10-CM

## 2022-11-01 DIAGNOSIS — I2510 Atherosclerotic heart disease of native coronary artery without angina pectoris: Secondary | ICD-10-CM

## 2022-11-01 DIAGNOSIS — I5041 Acute combined systolic (congestive) and diastolic (congestive) heart failure: Secondary | ICD-10-CM

## 2022-11-01 HISTORY — PX: RIGHT/LEFT HEART CATH AND CORONARY ANGIOGRAPHY: CATH118266

## 2022-11-01 LAB — POCT I-STAT EG7
Acid-Base Excess: 6 mmol/L — ABNORMAL HIGH (ref 0.0–2.0)
Bicarbonate: 31.9 mmol/L — ABNORMAL HIGH (ref 20.0–28.0)
Calcium, Ion: 1.18 mmol/L (ref 1.15–1.40)
HCT: 35 % — ABNORMAL LOW (ref 36.0–46.0)
Hemoglobin: 11.9 g/dL — ABNORMAL LOW (ref 12.0–15.0)
O2 Saturation: 68 %
Potassium: 3.8 mmol/L (ref 3.5–5.1)
Sodium: 142 mmol/L (ref 135–145)
TCO2: 33 mmol/L — ABNORMAL HIGH (ref 22–32)
pCO2, Ven: 51.2 mmHg (ref 44–60)
pH, Ven: 7.403 (ref 7.25–7.43)
pO2, Ven: 36 mmHg (ref 32–45)

## 2022-11-01 LAB — GLUCOSE, CAPILLARY
Glucose-Capillary: 56 mg/dL — ABNORMAL LOW (ref 70–99)
Glucose-Capillary: 99 mg/dL (ref 70–99)
Glucose-Capillary: 90 mg/dL (ref 70–99)
Glucose-Capillary: 122 mg/dL — ABNORMAL HIGH (ref 70–99)
Glucose-Capillary: 125 mg/dL — ABNORMAL HIGH (ref 70–99)
Glucose-Capillary: 153 mg/dL — ABNORMAL HIGH (ref 70–99)

## 2022-11-01 LAB — POCT I-STAT 7, (LYTES, BLD GAS, ICA,H+H)
Acid-Base Excess: 5 mmol/L — ABNORMAL HIGH (ref 0.0–2.0)
Acid-Base Excess: 6 mmol/L — ABNORMAL HIGH (ref 0.0–2.0)
Bicarbonate: 30 mmol/L — ABNORMAL HIGH (ref 20.0–28.0)
Bicarbonate: 31.7 mmol/L — ABNORMAL HIGH (ref 20.0–28.0)
Calcium, Ion: 1.15 mmol/L (ref 1.15–1.40)
Calcium, Ion: 1.18 mmol/L (ref 1.15–1.40)
HCT: 35 % — ABNORMAL LOW (ref 36.0–46.0)
HCT: 35 % — ABNORMAL LOW (ref 36.0–46.0)
Hemoglobin: 11.9 g/dL — ABNORMAL LOW (ref 12.0–15.0)
Hemoglobin: 11.9 g/dL — ABNORMAL LOW (ref 12.0–15.0)
O2 Saturation: 71 %
O2 Saturation: 96 %
Potassium: 3.8 mmol/L (ref 3.5–5.1)
Potassium: 3.8 mmol/L (ref 3.5–5.1)
Sodium: 142 mmol/L (ref 135–145)
Sodium: 142 mmol/L (ref 135–145)
TCO2: 31 mmol/L (ref 22–32)
TCO2: 33 mmol/L — ABNORMAL HIGH (ref 22–32)
pCO2 arterial: 46.2 mmHg (ref 32–48)
pCO2 arterial: 50.8 mmHg — ABNORMAL HIGH (ref 32–48)
pH, Arterial: 7.403 (ref 7.35–7.45)
pH, Arterial: 7.42 (ref 7.35–7.45)
pO2, Arterial: 38 mmHg — CL (ref 83–108)
pO2, Arterial: 81 mmHg — ABNORMAL LOW (ref 83–108)

## 2022-11-01 LAB — CULTURE, RESPIRATORY W GRAM STAIN
Culture: NO GROWTH
Gram Stain: NONE SEEN

## 2022-11-01 SURGERY — RIGHT/LEFT HEART CATH AND CORONARY ANGIOGRAPHY
Anesthesia: LOCAL

## 2022-11-01 MED ORDER — VERAPAMIL HCL 2.5 MG/ML IV SOLN
INTRAVENOUS | Status: DC | PRN
  Administered 2022-11-01: 10 mL via INTRA_ARTERIAL

## 2022-11-01 MED ORDER — ADULT MULTIVITAMIN W/MINERALS CH
1.0000 | ORAL_TABLET | Freq: Every day | ORAL | Status: DC
  Administered 2022-11-02 – 2022-11-06 (×5): 1 via ORAL
  Filled 2022-11-01 (×5): qty 1

## 2022-11-01 MED ORDER — LIDOCAINE HCL (PF) 1 % IJ SOLN
INTRAMUSCULAR | Status: DC | PRN
  Administered 2022-11-01: 2 mL

## 2022-11-01 MED ORDER — INSULIN ASPART 100 UNIT/ML IJ SOLN: 0.0000 [IU] | INTRAMUSCULAR | Status: DC

## 2022-11-01 MED ORDER — MAGNESIUM SULFATE 2 GM/50ML IV SOLN
2.0000 g | Freq: Once | INTRAVENOUS | Status: AC
  Administered 2022-11-01: 2 g via INTRAVENOUS
  Filled 2022-11-01: qty 50

## 2022-11-01 MED ORDER — PHENOL 1.4 % MT LIQD: 1.0000 | OROMUCOSAL | Status: DC | PRN

## 2022-11-01 MED ORDER — ASPIRIN 81 MG PO CHEW
81.0000 mg | CHEWABLE_TABLET | ORAL | Status: DC
  Filled 2022-11-01: qty 1

## 2022-11-01 MED ORDER — SODIUM CHLORIDE 0.9% FLUSH
3.0000 mL | Freq: Two times a day (BID) | INTRAVENOUS | Status: DC
  Administered 2022-11-01 – 2022-11-06 (×7): 3 mL via INTRAVENOUS

## 2022-11-01 MED ORDER — HEPARIN SODIUM (PORCINE) 1000 UNIT/ML IJ SOLN
INTRAMUSCULAR | Status: DC | PRN
  Administered 2022-11-01: 6000 [IU] via INTRAVENOUS

## 2022-11-01 MED ORDER — SODIUM CHLORIDE 0.9 % IV SOLN: INTRAVENOUS | Status: DC

## 2022-11-01 MED ORDER — PROSOURCE TF20 ENFIT COMPATIBL EN LIQD: 60.0000 mL | Freq: Three times a day (TID) | ENTERAL | Status: DC

## 2022-11-01 MED ORDER — VERAPAMIL HCL 2.5 MG/ML IV SOLN
INTRAVENOUS | Status: AC
  Filled 2022-11-01: qty 2

## 2022-11-01 MED ORDER — MIDAZOLAM HCL 2 MG/2ML IJ SOLN
INTRAMUSCULAR | Status: AC
  Filled 2022-11-01: qty 2

## 2022-11-01 MED ORDER — RIVAROXABAN 20 MG PO TABS
20.0000 mg | ORAL_TABLET | Freq: Every day | ORAL | Status: DC
  Administered 2022-11-02 – 2022-11-06 (×5): 20 mg via ORAL
  Filled 2022-11-01 (×5): qty 1

## 2022-11-01 MED ORDER — SODIUM CHLORIDE 0.9% FLUSH: 3.0000 mL | INTRAVENOUS | Status: DC | PRN

## 2022-11-01 MED ORDER — HEPARIN (PORCINE) IN NACL 1000-0.9 UT/500ML-% IV SOLN
INTRAVENOUS | Status: DC | PRN
  Administered 2022-11-01 (×2): 500 mL

## 2022-11-01 MED ORDER — FENTANYL CITRATE (PF) 100 MCG/2ML IJ SOLN
INTRAMUSCULAR | Status: AC
  Filled 2022-11-01: qty 2

## 2022-11-01 MED ORDER — SODIUM CHLORIDE 0.9 % IV SOLN
250.0000 mL | INTRAVENOUS | Status: DC | PRN
  Administered 2022-11-02: 250 mL via INTRAVENOUS

## 2022-11-01 MED ORDER — SODIUM CHLORIDE 0.9 % WEIGHT BASED INFUSION: 1.0000 mL/kg/h | INTRAVENOUS | Status: AC

## 2022-11-01 MED ORDER — IOHEXOL 350 MG/ML SOLN
INTRAVENOUS | Status: DC | PRN
  Administered 2022-11-01: 41 mL via INTRA_ARTERIAL

## 2022-11-01 MED ORDER — INSULIN ASPART 100 UNIT/ML IJ SOLN
0.0000 [IU] | Freq: Three times a day (TID) | INTRAMUSCULAR | Status: DC
  Administered 2022-11-01: 2 [IU] via SUBCUTANEOUS
  Administered 2022-11-01: 1 [IU] via SUBCUTANEOUS
  Administered 2022-11-02: 3 [IU] via SUBCUTANEOUS
  Administered 2022-11-02: 2 [IU] via SUBCUTANEOUS
  Administered 2022-11-03 – 2022-11-06 (×6): 1 [IU] via SUBCUTANEOUS

## 2022-11-01 MED ORDER — ENSURE ENLIVE PO LIQD: 237.0000 mL | Freq: Two times a day (BID) | ORAL | Status: DC

## 2022-11-01 MED ORDER — MIDAZOLAM HCL 2 MG/2ML IJ SOLN
INTRAMUSCULAR | Status: DC | PRN
  Administered 2022-11-01: 1 mg via INTRAVENOUS

## 2022-11-01 MED ORDER — LIDOCAINE HCL (PF) 1 % IJ SOLN
INTRAMUSCULAR | Status: AC
  Filled 2022-11-01: qty 30

## 2022-11-01 MED ORDER — HEPARIN SODIUM (PORCINE) 1000 UNIT/ML IJ SOLN
INTRAMUSCULAR | Status: AC
  Filled 2022-11-01: qty 10

## 2022-11-01 MED ORDER — FUROSEMIDE 10 MG/ML IJ SOLN
60.0000 mg | Freq: Four times a day (QID) | INTRAMUSCULAR | Status: DC
  Administered 2022-11-01 – 2022-11-02 (×3): 60 mg via INTRAVENOUS
  Filled 2022-11-01 (×3): qty 6

## 2022-11-01 MED ORDER — FENTANYL CITRATE (PF) 100 MCG/2ML IJ SOLN
INTRAMUSCULAR | Status: DC | PRN
  Administered 2022-11-01: 25 ug via INTRAVENOUS

## 2022-11-01 MED ORDER — CHLORHEXIDINE GLUCONATE CLOTH 2 % EX PADS
6.0000 | MEDICATED_PAD | Freq: Every day | CUTANEOUS | Status: AC
  Administered 2022-11-03 – 2022-11-05 (×3): 6 via TOPICAL

## 2022-11-01 MED ORDER — POTASSIUM CHLORIDE 10 MEQ/50ML IV SOLN
10.0000 meq | INTRAVENOUS | Status: AC
  Administered 2022-11-01 (×6): 10 meq via INTRAVENOUS
  Filled 2022-11-01 (×6): qty 50

## 2022-11-01 MED ORDER — VITAL 1.5 CAL PO LIQD: 1000.0000 mL | ORAL | Status: DC

## 2022-11-01 SURGICAL SUPPLY — 11 items
CATH 5FR JL3.5 JR4 ANG PIG MP (CATHETERS) IMPLANT
CATH BALLN WEDGE 5F 110CM (CATHETERS) IMPLANT
DEVICE RAD COMP TR BAND LRG (VASCULAR PRODUCTS) IMPLANT
GLIDESHEATH SLEND SS 6F .021 (SHEATH) IMPLANT
GUIDEWIRE .025 260CM (WIRE) IMPLANT
GUIDEWIRE INQWIRE 1.5J.035X260 (WIRE) IMPLANT
INQWIRE 1.5J .035X260CM (WIRE) ×1
PACK CARDIAC CATHETERIZATION (CUSTOM PROCEDURE TRAY) ×2 IMPLANT
SET ATX-X65L (MISCELLANEOUS) IMPLANT
SHEATH GLIDE SLENDER 4/5FR (SHEATH) IMPLANT
SHEATH PROBE COVER 6X72 (BAG) IMPLANT

## 2022-11-01 NOTE — Progress Notes (Signed)
Patient transported to [facility] Room 8 safely. No complications. Vital signs stable. Her daughter, Fuller [place], was notified of her transfer.

## 2022-11-01 NOTE — H&P (View-Only) (Signed)
Rounding Note    Patient Name: Sharon Vasquez Date of Encounter: 11/01/2022  [facility] Cardiologist: Nanetta Batty, MD   Subjective   Eager to get cardiac catheterization done, frustrated today.  No chest pain and no significant respiratory difficulties, about to receive a breathing treatment.  Inpatient Medications    Scheduled Meds:  arformoterol  15 mcg Nebulization BID   aspirin EC  81 mg Oral Daily   budesonide (PULMICORT) nebulizer solution  0.25 mg Nebulization BID   Chlorhexidine Gluconate Cloth  6 each Topical Q0600   docusate sodium  100 mg Oral BID   enoxaparin (LOVENOX) injection  150 mg Subcutaneous BID   guaiFENesin  10 mL Oral BID   insulin aspart  0-9 Units Subcutaneous TID WC   mupirocin ointment  1 Application Nasal BID   mouth rinse  15 mL Mouth Rinse 4 times per day   oxyCODONE  5 mg Oral Q6H   polyethylene glycol  17 g Oral Daily   revefenacin  175 mcg Nebulization Daily   Continuous Infusions:  azithromycin Stopped (10/31/22 2249)   dexmedetomidine (PRECEDEX) IV infusion Stopped (10/31/22 1047)   piperacillin-tazobactam (ZOSYN)  IV Stopped (11/01/22 0738)   potassium chloride 50 mL/hr at 11/01/22 1000   PRN Meds: acetaminophen, albuterol, hydrALAZINE, labetalol, mouth rinse, phenol, polyethylene glycol   Vital Signs    Vitals:   11/01/22 0848 11/01/22 0900 11/01/22 0939 11/01/22 1000  BP: 129/86 137/85  (!) 143/73  Pulse: 79 82  84  Resp: (!) 21 (!) 22  (!) 22  Temp: 99.1 F (37.3 C) 99.1 F (37.3 C)  99.1 F (37.3 C)  TempSrc:      SpO2: 92% 95% 98% 91%  Weight:      Height:        Intake/Output Summary (Last 24 hours) at 11/01/2022 1028 Last data filed at 11/01/2022 1000 Gross per 24 hour  Intake 1497.14 ml  Output 54098 ml  Net -10902.86 ml      11/01/2022    5:00 AM 10/31/2022    5:00 AM 10/30/2022    5:00 AM  Last 3 Weights  Weight (lbs) 339 lb 15.2 oz 370 lb 2.4 oz 367 lb 15.2 oz  Weight (kg) 154.2 kg 167.9  kg 166.9 kg      Telemetry    Afib controlled regular response- Personally Reviewed  ECG    No new - Personally Reviewed  Physical Exam   GEN: No acute distress.   Neck: JVD not well appreciated Cardiac: iRRR, no murmurs, rubs, or gallops.  Respiratory: crackles laterally GI: Soft, nontender, non-distended  MS: mild edema, legs wrapped; No deformity. Neuro:  Nonfocal  Psych: Normal affect   Labs    High Sensitivity Troponin:   Recent Labs  Lab 10/28/22 2210 10/29/22 0020 10/30/22 1230 10/30/22 1403  TROPONINIHS 20* 140* 100* 105*     Chemistry Recent Labs  Lab 10/30/22 0101 10/31/22 0428 11/01/22 0249  NA 139 140 142  K 4.5 4.0 3.1*  CL 105 105 100  CO2 23 25 30   GLUCOSE 132* 133* 94  BUN 22 24* 16  CREATININE 1.36* 1.08* 1.10*  CALCIUM 8.8* 8.6* 8.9  MG 1.9 2.1 1.7  PROT 6.1* 5.7* 6.9  ALBUMIN 3.0* 2.9* 3.7  AST 23 16 16   ALT 25 21 19   ALKPHOS 81 70 74  BILITOT 0.3 0.6 1.5*  GFRNONAA 41* 54* 53*  ANIONGAP 11 10 12     Lipids  Recent Labs  Lab 10/29/22 0423  TRIG 170*    Hematology Recent Labs  Lab 10/30/22 0101 10/31/22 0428 11/01/22 0249  WBC 11.1* 8.0 6.7  RBC 4.22 4.06 4.13  HGB 11.6* 11.3* 11.3*  HCT 35.6* 34.8* 35.0*  MCV 84.4 85.7 84.7  MCH 27.5 27.8 27.4  MCHC 32.6 32.5 32.3  RDW 14.6 14.6 14.3  PLT 419* 433* 414*   Thyroid No results for input(s): "TSH", "FREET4" in the last 168 hours.  BNP Recent Labs  Lab 10/28/22 2210  BNP 630.2*    DDimer No results for input(s): "DDIMER" in the last 168 hours.   Radiology    No results found.  Cardiac Studies   Echo with severely reduced EF  Patient Profile     72 y.o. female with a hx of HFpEF, Afib, COPD on home O2, resp failure, DM, HTN, HLD, DVT 2018, OSA, CVA, admitted with respiratory failure found to have reduced EF.   Assessment & Plan    Principal Problem:   CHF (congestive heart failure) (HCC) Active Problems:   Acute respiratory failure with hypoxia  (HCC)   Acute pulmonary edema (HCC)   Cardiac arrest [facility])  Patient is a 72 year old female with a history of HFpEF and A-fib, COPD and chronic respiratory failure morbid obesity, diabetes, hypertension, hyperlipidemia, history of DVT PE, OSA, CVA.  Cardiology consulted for severely reduced ejection fraction.  As above, patient was noted to feel poorly [place], noted to have significant hypoxemia and intubated on presentation to the ED.  Though she has a history of PE, she has been on Xarelto and she has not yet been scanned for PE given low likelihood with chronic anticoagulation.  Known severe OSA, though patient's home CPAP machine has not been working therefore she has not been using it recently.  Currently on blood pressure support with norepinephrine.  Noted to be in atrial fibrillation, with rapid ventricular response requiring amiodarone infusion, overnight telemetry suggests bradycardia and ventricular standstill with pauses, amiodarone discontinued. Patient developed a mucous plug in her ET tube and became hypoxic with PEA arrest requiring brief CPR and bronchoscopy performed with return of purulent fluid.     Patient's echocardiogram independently reviewed with evidence of EF approximately 20% with dense regional wall motion abnormalities in the LAD distribution.  Best preserved function in the basal segments.  Chest CT from earlier this year also reviewed with evidence of severe three-vessel and left main coronary artery calcifications.  Patient has had a coronary CT performed in 2020 with evidence of small nondominant RCA obstructive disease but otherwise nonobstructive CAD.  She was noted at that time to have mild left main stenosis and moderate LAD stenosis.   Given the patient's echocardiogram cannot exclude LAD infarct though EKG has no significant ischemic changes and troponin was 20 on presentation in extremis and rose only to 140.  This makes ACS less  likely.  The most likely scenario is stress cardiomyopathy from respiratory decompensation.  Patient warrants a coronary angiogram.  -Vigorous diuresis, 9 L net negative with 15 L of urine output in last 2 days - Plan for right and left heart catheterization today as for he is present and patient's respiratory status seems overall stable.  She was able to lay flat for an extended period of time yesterday evening. -Continues on enoxaparin therapeutic -ASA 81 mg daily - Patient blood pressure seems significantly elevated but blood pressure is being obtained from a wrist cuff. -Left heart catheterization  for evaluation for left main or LAD disease which is known to be mild to moderate from coronary CT previously. -Right heart catheterization given multiple previous episodes of heart failure with preserved ejection fraction per her primary doctor, would be helpful to document pulmonary and wedge pressures.  For questions or updates, please contact [facility] Please consult [email or web address] for contact info under        Signed, Parke Poisson, MD  11/01/2022, 10:28 AM    INFORMED CONSENT: I have reviewed the risks, indications, and alternatives to cardiac catheterization, possible angioplasty, and stenting with the patient. Risks include but are not limited to bleeding, infection, vascular injury, stroke, myocardial infarction, arrhythmia, kidney injury, radiation-related injury in the case of prolonged fluoroscopy use, emergency cardiac surgery, and death. The patient understands the risks of serious complication is 1-2 in 1000 with diagnostic cardiac cath and 1-2% or less with angioplasty/stenting.  INFORMED CONSENT: I have reviewed the risks, indications, and alternatives to right heart catheterization with the patient. Risks include but are not limited to bleeding, infection, vascular injury, stroke, myocardial infarction, arrhythmia, kidney injury, radiation-related injury in the case  of prolonged fluoroscopy use, emergency cardiac surgery, and death. The patient understands the risks of serious complication is 1-2 in 1000 with diagnostic cardiac cath, and is willing to proceed.

## 2022-11-01 NOTE — Progress Notes (Signed)
[facility] ADULT ICU REPLACEMENT PROTOCOL   The patient does apply for the [facility] Adult ICU Electrolyte Replacment Protocol based on the criteria listed below:   1.Exclusion criteria: TCTS, ECMO, Dialysis, and Myasthenia Gravis patients 2. Is GFR >/= 30 ml/min? Yes.    Patient's GFR today is 53 3. Is SCr </= 2? Yes.   Patient's SCr is 1.10 mg/dL 4. Did SCr increase >/= 0.5 in 24 hours? No. 5.Pt's weight >40kg  Yes.   6. Abnormal electrolyte(s): Potassium 3.1, Magnesium 1.7  7. Electrolytes replaced per protocol 8.  Call MD STAT for K+ </= 2.5, Phos </= 1, or Mag </= 1 Physician:  Dr. Namon Cirri A Cystal Shannahan 11/01/2022 3:37 AM

## 2022-11-01 NOTE — TOC Initial Note (Signed)
Transition of Care [facility]) - Initial/Assessment Note    Patient Details  Name: Sharon Vasquez [name]: [id number] Date of Birth: 28-Nov-1950  [facility]) CM/SW Contact:    Harriet Masson, RN Phone Number: 11/01/2022, 4:27 PM  Clinical Narrative:                  Spoke to patient regarding transition needs. Patient lives alone and uses [employer] for transportation.  Patient has electric wheelchair and has home 02 3L from [employer]. Patient would like to speak to her daughters before deciding on SNF vs Home health. Address, Phone number and PCP verified. TOC following.  Expected Discharge Plan: Skilled Nursing Facility Barriers to Discharge: Continued Medical Work up   Patient Goals and CMS Choice Patient states their goals for this hospitalization and ongoing recovery are:: get better          Expected Discharge Plan and Services       Living arrangements for the past 2 months: Apartment                                      Prior Living Arrangements/Services Living arrangements for the past 2 months: Apartment Lives with:: Self Patient language and need for interpreter reviewed:: Yes        Need for Family Participation in Patient Care: Yes (Comment) Care giver support system in place?: Yes (comment)   Criminal Activity/Legal Involvement Pertinent to Current Situation/Hospitalization: No - Comment as needed  Activities of Daily Living      Permission Sought/Granted                  Emotional Assessment Appearance:: Appears stated age Attitude/Demeanor/Rapport: Engaged Affect (typically observed): Accepting Orientation: : Oriented to Self, Oriented to Place, Oriented to  Time, Oriented to Situation Alcohol / Substance Use: Not Applicable Psych Involvement: No (comment)  Admission diagnosis:  CHF (congestive heart failure) (HCC) [I50.9] Acute pulmonary edema (HCC) [J81.0] Acute cystitis without hematuria [N30.00] Acute respiratory failure  with hypoxia (HCC) [J96.01] Patient Active Problem List   Diagnosis Date Noted   Acute pulmonary edema (HCC) 10/30/2022   Cardiac arrest (HCC) 10/30/2022   CHF (congestive heart failure) (HCC) 10/28/2022   Enlarged thoracic aorta (HCC) 05/09/2022   Healthcare maintenance 05/09/2022   Foul smelling urine 05/04/2022   Anxiety attack 03/17/2022   Prediabetes 04/09/2021   Goals of care, counseling/discussion 03/23/2021   Malignant neoplasm of upper-outer quadrant of right breast in female, estrogen receptor positive (HCC) 03/21/2021   Right foot pain 03/03/2021   (HFpEF) heart failure with preserved ejection fraction (HCC)    Osteoarthritis of left hip 10/07/2020   Venous stasis ulcer of left lower extremity (HCC) 08/18/2020   Hyperlipidemia 05/14/2020   Acute respiratory failure with hypoxia (HCC) 04/10/2020   Chronic respiratory failure with hypoxia (HCC) 09/30/2019   Urinary incontinence 08/21/2019   GERD (gastroesophageal reflux disease) 01/20/2019   OSA (obstructive sleep apnea) 11/14/2017   Chronic pain syndrome 07/27/2017   Major depression, recurrent, chronic (HCC) 02/01/2017   Osteoporosis 12/14/2016   Aortic atherosclerosis (HCC) 07/17/2016   Chronic venous insufficiency 07/12/2016   Chronic anticoagulation 07/12/2016   Tobacco use 07/11/2016   COPD (chronic obstructive pulmonary disease) (HCC)    Atrial fibrillation with controlled ventricular response (HCC)    Vocal cord polyp 12/18/2015   Morbid obesity (HCC)    Peripheral vascular disease (HCC)  Benign essential HTN    Primary osteoarthritis of both knees 09/16/2014   PCP:  [name], DO Pharmacy:   [facility] [phone] Ginette Otto, [place] - [address] AT [facility] [address] [place] [address] Phone: [phone] Fax: [phone]  [facility] - [place], [place] - [address] [place]  [address] Phone: [phone] Fax: [phone]     Social Determinants of Health (SDOH) Social History: SDOH Screenings   Food Insecurity: No Food Insecurity (09/22/2022)  Housing: Low Risk  (09/22/2022)  Transportation Needs: No Transportation Needs (09/22/2022)  Utilities: Not At Risk (03/15/2022)  Alcohol Screen: Low Risk  (12/02/2021)  Depression (PHQ2-9): Medium Risk (10/25/2022)  Financial Resource Strain: Medium Risk (12/02/2021)  Physical Activity: Inactive (12/02/2021)  Social Connections: Moderately Integrated (03/15/2022)  Stress: No Stress Concern Present (12/02/2021)  Tobacco Use: Medium Risk (10/28/2022)   SDOH Interventions:     Readmission Risk Interventions     No data to display

## 2022-11-01 NOTE — Evaluation (Addendum)
Clinical/Bedside Swallow Evaluation Patient Details  Name: Sharon Vasquez [name]: [id number] Date of Birth: February 04, 1951  Today's Date: 11/01/2022 Time: SLP Start Time (ACUTE ONLY): 1884 SLP Stop Time (ACUTE ONLY): 0930 SLP Time Calculation (min) (ACUTE ONLY): 7 min  Past Medical History:  Past Medical History:  Diagnosis Date   A-fib (HCC)    Acute hypoxemic respiratory failure (HCC) 02/20/2022   Acute on chronic hypoxic respiratory failure (HCC) 01/04/2022   Acute upper respiratory infection 01/08/2022   Anxiety    Arthritis    "qwhre; joints, back" (04/17/2017)   Atrial fibrillation (HCC)    Benign breast cyst in female, left 01/08/2017   Found by Screening mammogram, evaluated by U/S on 01/08/17 and determined to be a benign simple breast cyst.   Breast cancer (HCC)    Cellulitis of left lower leg 05/30/2017   CHF (congestive heart failure) (HCC)    Chronic low back pain 08/21/2016   Chronic lower back pain    Chronic venous insufficiency    /notes 05/30/2017   COPD (chronic obstructive pulmonary disease) (HCC)    Depression    Diabetes (HCC) 01/08/2022   Diabetes mellitus without complication (HCC)    DVT (deep venous thrombosis) (HCC) 11/16/2016   Dysrhythmia    Esophageal dysmotility 11/10/2019   Previous workup by ENT with fiberoptic leryngoscopy which was normal. Dx with laryngeal pharyngeal reflux.   GERD (gastroesophageal reflux disease)    Headache    "weekly for the last 3 months" (04/17/2017)   History of pulmonary embolism    Pulmonary embolism   Hyperlipidemia    Hypertension    Laryngopharyngeal reflux 12/18/2015   Morbid obesity (HCC)    PE (pulmonary embolism)    Pulmonary embolism (HCC) 09/21/2014   Sleep apnea    Stroke (HCC) 12/02/2021   Past Surgical History:  Past Surgical History:  Procedure Laterality Date   ABDOMINAL HYSTERECTOMY     APPENDECTOMY     BALLOON DILATION N/A 03/09/2020   Procedure: BALLOON DILATION;  Surgeon: Kathi Der, MD;   Location: [facility];  Service: Gastroenterology;  Laterality: N/A;   BIOPSY  03/09/2020   Procedure: BIOPSY;  Surgeon: Kathi Der, MD;  Location: [facility];  Service: Gastroenterology;;   BIOPSY  04/12/2021   Procedure: BIOPSY;  Surgeon: Kathi Der, MD;  Location: [facility];  Service: Gastroenterology;;   BREAST BIOPSY Right 03/11/2021   US biopsy/ coil clip/ path pending   BREAST BIOPSY Right 03/11/2021   US biopsy/ ribbone clip/ path pending   BREAST CYST EXCISION Left    "six o'clock"   BREAST LUMPECTOMY Left    BREAST LUMPECTOMY WITH RADIOACTIVE SEED LOCALIZATION Right 05/02/2021   Procedure: RIGHT BREAST LUMPECTOMY WITH RADIOACTIVE SEED LOCALIZATION;  Surgeon: Griselda Miner, MD;  Location: [facility] OR;  Service: General;  Laterality: Right;   CHOLECYSTECTOMY     DILATION AND CURETTAGE OF UTERUS     ESOPHAGOGASTRODUODENOSCOPY (EGD) WITH PROPOFOL N/A 03/09/2020   Procedure: ESOPHAGOGASTRODUODENOSCOPY (EGD) WITH PROPOFOL;  Surgeon: Kathi Der, MD;  Location: [facility];  Service: Gastroenterology;  Laterality: N/A;   ESOPHAGOGASTRODUODENOSCOPY (EGD) WITH PROPOFOL N/A 04/12/2021   Procedure: ESOPHAGOGASTRODUODENOSCOPY (EGD) WITH PROPOFOL;  Surgeon: Kathi Der, MD;  Location: [facility];  Service: Gastroenterology;  Laterality: N/A;   IR ABLATE LIVER CRYOABLATION  07/23/2019   IR RADIOLOGIST EVAL & MGMT  07/18/2019   SAVORY DILATION N/A 04/12/2021   Procedure: SAVORY DILATION;  Surgeon: Kathi Der, MD;  Location: [facility];  Service: Gastroenterology;  Laterality: N/A;  TONSILLECTOMY AND ADENOIDECTOMY     TUBAL LIGATION     HPI:  Sharon Vasquez is a 72 year old female who was admitted for acute hypoxic and hypercarbic respiratory failure requiring intubation mechanical ventilation 7/27-31, found to have new severe HFrEF. Pt with arrest from mucus plugging 7/29, s/p bronchoscopy.  CXR 7/29: "Increased diffuse interstitial opacities, likely due to  worsened pulmonary edema." Pt with chronic HFpEF, OSA and CPAP nonadherence, ILD.    Assessment / Plan / Recommendation  Clinical Impression  Pt presents with functional swallowing as assessed clinically.  Her voice is clear and strong and reports that it is consistent with baseline now.  She consumed thin liquid by straw, puree, and regular solid, and exhibited good oral clearance with all textures.  There was one cough following serial sips of thin liquid.  She cleared her throat then used a stronger cough to bring something up and expectorate into yankauer.  There were no further clinical s/s of aspiration with additional serial straw sips of thin liquid.  Pt reports that she does not wear dentures with PO intake at home.  She has a hx of esophageal dilation which has improved her previous swallowing issues. She endorses having had repeat dilation with EGD.  She appears safe to initiate a PO diet.  Pt has no further ST needs at this time.  SLP will sign off.    Recommend regular texture diet with thin liquid.   SLP Visit Diagnosis: Dysphagia, unspecified (R13.10)    Aspiration Risk  No limitations    Diet Recommendation Regular;Thin liquid    Liquid Administration via: Cup;Straw Medication Administration: Whole meds with liquid Supervision:  (PRN) Compensations: Slow rate;Small sips/bites Postural Changes: Seated upright at 90 degrees    Other  Recommendations Oral Care Recommendations: Oral care BID    Recommendations for follow up therapy are one component of a multi-disciplinary discharge planning process, led by the attending physician.  Recommendations may be updated based on patient status, additional functional criteria and insurance authorization.  Follow up Recommendations No SLP follow up      Assistance Recommended at Discharge  N/A  Functional Status Assessment Patient has not had a recent decline in their functional status  Frequency and Duration  (N/A)           Prognosis Prognosis for improved oropharyngeal function:  (N/A)      Swallow Study   General Date of Onset: 10/28/22 HPI: Sharon Vasquez is a 72 year old female who was admitted for acute hypoxic and hypercarbic respiratory failure requiring intubation mechanical ventilation 7/27-31, found to have new severe HFrEF. Pt with arrest from mucus plugging 7/29, s/p bronchoscopy.  CXR 7/29: "Increased diffuse interstitial opacities, likely due to worsened pulmonary edema." Pt with chronic HFpEF, OSA and CPAP nonadherence, ILD. Type of Study: Bedside Swallow Evaluation Previous Swallow Assessment: None Diet Prior to this Study: NPO Temperature Spikes Noted: No Respiratory Status: Nasal cannula History of Recent Intubation: Yes Total duration of intubation (days): 3 days Date extubated: 10/31/22 Behavior/Cognition: Alert;Cooperative;Pleasant mood Oral Cavity Assessment: Within Functional Limits Oral Care Completed by SLP: No Oral Cavity - Dentition: Poor condition;Missing dentition Patient Positioning: Upright in bed Baseline Vocal Quality: Normal Volitional Cough: Strong Volitional Swallow: Able to elicit    Oral/Motor/Sensory Function Overall Oral Motor/Sensory Function: Within functional limits Facial ROM: Within Functional Limits Facial Symmetry: Within Functional Limits Lingual ROM: Within Functional Limits Lingual Symmetry: Within Functional Limits Lingual Strength: Within Functional Limits Velum: Within Functional Limits Mandible: Within Functional Limits  Ice Chips Ice chips: Not tested   Thin Liquid Thin Liquid: Within functional limits Presentation: Straw    Nectar Thick Nectar Thick Liquid: Not tested   Honey Thick Honey Thick Liquid: Not tested   Puree Puree: Within functional limits Presentation: Spoon   Solid     Solid: Within functional limits Presentation:  (self fed)      Kerrie Pleasure, MA, CCC-SLP Acute Rehabilitation Services Office:  [phone] 11/01/2022,10:37 AM

## 2022-11-01 NOTE — Progress Notes (Signed)
OT Cancellation Note  Patient Details Name: Sharon Vasquez [name]: [id number] DOB: [birth year]   Cancelled Treatment:    Reason Eval/Treat Not Completed: Patient at procedure or test/ unavailable (R/L heart cath, OT to f/u post-op as appropriate.)  Donia Pounds 11/01/2022, 11:56 AM

## 2022-11-01 NOTE — Plan of Care (Signed)
Problem: Education: Goal: Ability to describe self-care measures that may prevent or decrease complications (Diabetes Survival Skills Education) will improve 11/01/2022 1500 by Annice Pih, RN Outcome: Progressing 11/01/2022 1458 by Annice Pih, RN Outcome: Progressing 11/01/2022 1458 by Annice Pih, RN Outcome: Progressing Goal: Individualized Educational Video(s) 11/01/2022 1500 by Annice Pih, RN Outcome: Progressing 11/01/2022 1458 by Annice Pih, RN Outcome: Progressing   Problem: Coping: Goal: Ability to adjust to condition or change in health will improve 11/01/2022 1500 by Annice Pih, RN Outcome: Progressing 11/01/2022 1458 by Annice Pih, RN Outcome: Progressing 11/01/2022 1458 by Annice Pih, RN Outcome: Progressing   Problem: Fluid Volume: Goal: Ability to maintain a balanced intake and output will improve 11/01/2022 1500 by Annice Pih, RN Outcome: Progressing 11/01/2022 1458 by Annice Pih, RN Outcome: Progressing 11/01/2022 1458 by Annice Pih, RN Outcome: Progressing   Problem: Health Behavior/Discharge Planning: Goal: Ability to identify and utilize available resources and services will improve 11/01/2022 1500 by Annice Pih, RN Outcome: Progressing 11/01/2022 1458 by Annice Pih, RN Outcome: Progressing 11/01/2022 1458 by Annice Pih, RN Outcome: Progressing Goal: Ability to manage health-related needs will improve 11/01/2022 1500 by Annice Pih, RN Outcome: Progressing 11/01/2022 1458 by Annice Pih, RN Outcome: Progressing 11/01/2022 1458 by Annice Pih, RN Outcome: Progressing   Problem: Metabolic: Goal: Ability to maintain appropriate glucose levels will improve 11/01/2022 1500 by Annice Pih, RN Outcome: Progressing 11/01/2022 1458 by Annice Pih, RN Outcome: Progressing 11/01/2022 1458 by Annice Pih, RN Outcome: Progressing   Problem: Nutritional: Goal: Maintenance of adequate nutrition will  improve 11/01/2022 1500 by Annice Pih, RN Outcome: Progressing 11/01/2022 1458 by Annice Pih, RN Outcome: Progressing 11/01/2022 1458 by Annice Pih, RN Outcome: Progressing Goal: Progress toward achieving an optimal weight will improve 11/01/2022 1500 by Annice Pih, RN Outcome: Progressing 11/01/2022 1458 by Annice Pih, RN Outcome: Progressing 11/01/2022 1458 by Annice Pih, RN Outcome: Progressing   Problem: Skin Integrity: Goal: Risk for impaired skin integrity will decrease 11/01/2022 1500 by Annice Pih, RN Outcome: Progressing 11/01/2022 1458 by Annice Pih, RN Outcome: Progressing 11/01/2022 1458 by Annice Pih, RN Outcome: Progressing   Problem: Tissue Perfusion: Goal: Adequacy of tissue perfusion will improve 11/01/2022 1500 by Annice Pih, RN Outcome: Progressing 11/01/2022 1458 by Annice Pih, RN Outcome: Progressing 11/01/2022 1458 by Annice Pih, RN Outcome: Progressing   Problem: Education: Goal: Knowledge of General Education information will improve Description: Including pain rating scale, medication(s)/side effects and non-pharmacologic comfort measures 11/01/2022 1500 by Annice Pih, RN Outcome: Progressing 11/01/2022 1458 by Annice Pih, RN Outcome: Progressing 11/01/2022 1458 by Annice Pih, RN Outcome: Progressing   Problem: Health Behavior/Discharge Planning: Goal: Ability to manage health-related needs will improve 11/01/2022 1500 by Annice Pih, RN Outcome: Progressing 11/01/2022 1458 by Annice Pih, RN Outcome: Progressing 11/01/2022 1458 by Annice Pih, RN Outcome: Progressing   Problem: Clinical Measurements: Goal: Ability to maintain clinical measurements within normal limits will improve 11/01/2022 1500 by Annice Pih, RN Outcome: Progressing 11/01/2022 1458 by Annice Pih, RN Outcome: Progressing 11/01/2022 1458 by Annice Pih, RN Outcome: Progressing Goal: Will remain free from  infection 11/01/2022 1500 by Annice Pih, RN Outcome: Progressing 11/01/2022 1458 by Annice Pih, RN Outcome: Progressing 11/01/2022 1458 by Annice Pih, RN Outcome: Progressing Goal: Diagnostic test  results will improve 11/01/2022 1500 by Annice Pih, RN Outcome: Progressing 11/01/2022 1458 by Annice Pih, RN Outcome: Progressing 11/01/2022 1458 by Annice Pih, RN Outcome: Progressing Goal: Respiratory complications will improve 11/01/2022 1500 by Annice Pih, RN Outcome: Progressing 11/01/2022 1458 by Annice Pih, RN Outcome: Progressing 11/01/2022 1458 by Annice Pih, RN Outcome: Progressing Goal: Cardiovascular complication will be avoided 11/01/2022 1500 by Annice Pih, RN Outcome: Progressing 11/01/2022 1458 by Annice Pih, RN Outcome: Progressing 11/01/2022 1458 by Annice Pih, RN Outcome: Progressing   Problem: Activity: Goal: Risk for activity intolerance will decrease 11/01/2022 1500 by Annice Pih, RN Outcome: Progressing 11/01/2022 1458 by Annice Pih, RN Outcome: Progressing 11/01/2022 1458 by Annice Pih, RN Outcome: Progressing   Problem: Nutrition: Goal: Adequate nutrition will be maintained 11/01/2022 1500 by Annice Pih, RN Outcome: Progressing 11/01/2022 1458 by Annice Pih, RN Outcome: Progressing 11/01/2022 1458 by Annice Pih, RN Outcome: Progressing   Problem: Coping: Goal: Level of anxiety will decrease 11/01/2022 1500 by Annice Pih, RN Outcome: Progressing 11/01/2022 1458 by Annice Pih, RN Outcome: Progressing 11/01/2022 1458 by Annice Pih, RN Outcome: Progressing   Problem: Elimination: Goal: Will not experience complications related to bowel motility 11/01/2022 1500 by Annice Pih, RN Outcome: Progressing 11/01/2022 1458 by Annice Pih, RN Outcome: Progressing 11/01/2022 1458 by Annice Pih, RN Outcome: Progressing Goal: Will not experience complications related to urinary  retention 11/01/2022 1500 by Annice Pih, RN Outcome: Progressing 11/01/2022 1458 by Annice Pih, RN Outcome: Progressing 11/01/2022 1458 by Annice Pih, RN Outcome: Progressing   Problem: Pain Managment: Goal: General experience of comfort will improve 11/01/2022 1500 by Annice Pih, RN Outcome: Progressing 11/01/2022 1458 by Annice Pih, RN Outcome: Progressing 11/01/2022 1458 by Annice Pih, RN Outcome: Progressing   Problem: Safety: Goal: Ability to remain free from injury will improve 11/01/2022 1500 by Annice Pih, RN Outcome: Progressing 11/01/2022 1458 by Annice Pih, RN Outcome: Progressing 11/01/2022 1458 by Annice Pih, RN Outcome: Progressing   Problem: Skin Integrity: Goal: Risk for impaired skin integrity will decrease 11/01/2022 1500 by Annice Pih, RN Outcome: Progressing 11/01/2022 1458 by Annice Pih, RN Outcome: Progressing 11/01/2022 1458 by Annice Pih, RN Outcome: Progressing   Problem: Education: Goal: Understanding of CV disease, CV risk reduction, and recovery process will improve 11/01/2022 1500 by Annice Pih, RN Outcome: Progressing 11/01/2022 1458 by Annice Pih, RN Outcome: Progressing 11/01/2022 1458 by Annice Pih, RN Outcome: Progressing Goal: Individualized Educational Video(s) 11/01/2022 1500 by Annice Pih, RN Outcome: Progressing 11/01/2022 1458 by Annice Pih, RN Outcome: Progressing   Problem: Activity: Goal: Ability to return to baseline activity level will improve 11/01/2022 1500 by Annice Pih, RN Outcome: Progressing 11/01/2022 1458 by Annice Pih, RN Outcome: Progressing 11/01/2022 1458 by Annice Pih, RN Outcome: Progressing   Problem: Cardiovascular: Goal: Ability to achieve and maintain adequate cardiovascular perfusion will improve 11/01/2022 1500 by Annice Pih, RN Outcome: Progressing 11/01/2022 1458 by Annice Pih, RN Outcome: Progressing 11/01/2022 1458 by  Annice Pih, RN Outcome: Progressing Goal: Vascular access site(s) Level 0-1 will be maintained 11/01/2022 1500 by Annice Pih, RN Outcome: Progressing 11/01/2022 1458 by Annice Pih, RN Outcome: Progressing 11/01/2022 1458 by Annice Pih, RN Outcome: Progressing   Problem: Health Behavior/Discharge Planning: Goal: Ability to safely manage  health-related needs after discharge will improve 11/01/2022 1500 by Annice Pih, RN Outcome: Progressing 11/01/2022 1458 by Annice Pih, RN Outcome: Progressing 11/01/2022 1458 by Annice Pih, RN Outcome: Progressing

## 2022-11-01 NOTE — Progress Notes (Signed)
Rounding Note    Patient Name: Sharon Vasquez Date of Encounter: 11/01/2022  [facility] Cardiologist: Nanetta Batty, MD   Subjective   Eager to get cardiac catheterization done, frustrated today.  No chest pain and no significant respiratory difficulties, about to receive a breathing treatment.  Inpatient Medications    Scheduled Meds:  arformoterol  15 mcg Nebulization BID   aspirin EC  81 mg Oral Daily   budesonide (PULMICORT) nebulizer solution  0.25 mg Nebulization BID   Chlorhexidine Gluconate Cloth  6 each Topical Q0600   docusate sodium  100 mg Oral BID   enoxaparin (LOVENOX) injection  150 mg Subcutaneous BID   guaiFENesin  10 mL Oral BID   insulin aspart  0-9 Units Subcutaneous TID WC   mupirocin ointment  1 Application Nasal BID   mouth rinse  15 mL Mouth Rinse 4 times per day   oxyCODONE  5 mg Oral Q6H   polyethylene glycol  17 g Oral Daily   revefenacin  175 mcg Nebulization Daily   Continuous Infusions:  azithromycin Stopped (10/31/22 2249)   dexmedetomidine (PRECEDEX) IV infusion Stopped (10/31/22 1047)   piperacillin-tazobactam (ZOSYN)  IV Stopped (11/01/22 0738)   potassium chloride 50 mL/hr at 11/01/22 1000   PRN Meds: acetaminophen, albuterol, hydrALAZINE, labetalol, mouth rinse, phenol, polyethylene glycol   Vital Signs    Vitals:   11/01/22 0848 11/01/22 0900 11/01/22 0939 11/01/22 1000  BP: 129/86 137/85  (!) 143/73  Pulse: 79 82  84  Resp: (!) 21 (!) 22  (!) 22  Temp: 99.1 F (37.3 C) 99.1 F (37.3 C)  99.1 F (37.3 C)  TempSrc:      SpO2: 92% 95% 98% 91%  Weight:      Height:        Intake/Output Summary (Last 24 hours) at 11/01/2022 1028 Last data filed at 11/01/2022 1000 Gross per 24 hour  Intake 1497.14 ml  Output 54098 ml  Net -10902.86 ml      11/01/2022    5:00 AM 10/31/2022    5:00 AM 10/30/2022    5:00 AM  Last 3 Weights  Weight (lbs) 339 lb 15.2 oz 370 lb 2.4 oz 367 lb 15.2 oz  Weight (kg) 154.2 kg 167.9  kg 166.9 kg      Telemetry    Afib controlled regular response- Personally Reviewed  ECG    No new - Personally Reviewed  Physical Exam   GEN: No acute distress.   Neck: JVD not well appreciated Cardiac: iRRR, no murmurs, rubs, or gallops.  Respiratory: crackles laterally GI: Soft, nontender, non-distended  MS: mild edema, legs wrapped; No deformity. Neuro:  Nonfocal  Psych: Normal affect   Labs    High Sensitivity Troponin:   Recent Labs  Lab 10/28/22 2210 10/29/22 0020 10/30/22 1230 10/30/22 1403  TROPONINIHS 20* 140* 100* 105*     Chemistry Recent Labs  Lab 10/30/22 0101 10/31/22 0428 11/01/22 0249  NA 139 140 142  K 4.5 4.0 3.1*  CL 105 105 100  CO2 23 25 30   GLUCOSE 132* 133* 94  BUN 22 24* 16  CREATININE 1.36* 1.08* 1.10*  CALCIUM 8.8* 8.6* 8.9  MG 1.9 2.1 1.7  PROT 6.1* 5.7* 6.9  ALBUMIN 3.0* 2.9* 3.7  AST 23 16 16   ALT 25 21 19   ALKPHOS 81 70 74  BILITOT 0.3 0.6 1.5*  GFRNONAA 41* 54* 53*  ANIONGAP 11 10 12     Lipids  Recent Labs  Lab 10/29/22 0423  TRIG 170*    Hematology Recent Labs  Lab 10/30/22 0101 10/31/22 0428 11/01/22 0249  WBC 11.1* 8.0 6.7  RBC 4.22 4.06 4.13  HGB 11.6* 11.3* 11.3*  HCT 35.6* 34.8* 35.0*  MCV 84.4 85.7 84.7  MCH 27.5 27.8 27.4  MCHC 32.6 32.5 32.3  RDW 14.6 14.6 14.3  PLT 419* 433* 414*   Thyroid No results for input(s): "TSH", "FREET4" in the last 168 hours.  BNP Recent Labs  Lab 10/28/22 2210  BNP 630.2*    DDimer No results for input(s): "DDIMER" in the last 168 hours.   Radiology    No results found.  Cardiac Studies   Echo with severely reduced EF  Patient Profile     72 y.o. female with a hx of HFpEF, Afib, COPD on home O2, resp failure, DM, HTN, HLD, DVT 2018, OSA, CVA, admitted with respiratory failure found to have reduced EF.   Assessment & Plan    Principal Problem:   CHF (congestive heart failure) (HCC) Active Problems:   Acute respiratory failure with hypoxia  (HCC)   Acute pulmonary edema (HCC)   Cardiac arrest [facility])  Patient is a 72 year old female with a history of HFpEF and A-fib, COPD and chronic respiratory failure morbid obesity, diabetes, hypertension, hyperlipidemia, history of DVT PE, OSA, CVA.  Cardiology consulted for severely reduced ejection fraction.  As above, patient was noted to feel poorly [place] noted to have significant hypoxemia and intubated on presentation to the ED.  Though she has a history of PE, she has been on Xarelto and she has not yet been scanned for PE given low likelihood with chronic anticoagulation.  Known severe OSA, though patient's home CPAP machine has not been working therefore she has not been using it recently.  Currently on blood pressure support with norepinephrine.  Noted to be in atrial fibrillation, with rapid ventricular response requiring amiodarone infusion, overnight telemetry suggests bradycardia and ventricular standstill with pauses, amiodarone discontinued. Patient developed a mucous plug in her ET tube and became hypoxic with PEA arrest requiring brief CPR and bronchoscopy performed with return of purulent fluid.     Patient's echocardiogram independently reviewed with evidence of EF approximately 20% with dense regional wall motion abnormalities in the LAD distribution.  Best preserved function in the basal segments.  Chest CT from earlier this year also reviewed with evidence of severe three-vessel and left main coronary artery calcifications.  Patient has had a coronary CT performed in 2020 with evidence of small nondominant RCA obstructive disease but otherwise nonobstructive CAD.  She was noted at that time to have mild left main stenosis and moderate LAD stenosis.   Given the patient's echocardiogram cannot exclude LAD infarct though EKG has no significant ischemic changes and troponin was 20 on presentation in extremis and rose only to 140.  This makes ACS less  likely.  The most likely scenario is stress cardiomyopathy from respiratory decompensation.  Patient warrants a coronary angiogram.  -Vigorous diuresis, 9 L net negative with 15 L of urine output in last 2 days - Plan for right and left heart catheterization today as for he is present and patient's respiratory status seems overall stable.  She was able to lay flat for an extended period of time yesterday evening. -Continues on enoxaparin therapeutic -ASA 81 mg daily - Patient blood pressure seems significantly elevated but blood pressure is being obtained from a wrist cuff. -Left heart catheterization  for evaluation for left main or LAD disease which is known to be mild to moderate from coronary CT previously. -Right heart catheterization given multiple previous episodes of heart failure with preserved ejection fraction per her primary doctor, would be helpful to document pulmonary and wedge pressures.  For questions or updates, please contact [facility] Please consult [email or web address] for contact info under        Signed, Parke Poisson, MD  11/01/2022, 10:28 AM    INFORMED CONSENT: I have reviewed the risks, indications, and alternatives to cardiac catheterization, possible angioplasty, and stenting with the patient. Risks include but are not limited to bleeding, infection, vascular injury, stroke, myocardial infarction, arrhythmia, kidney injury, radiation-related injury in the case of prolonged fluoroscopy use, emergency cardiac surgery, and death. The patient understands the risks of serious complication is 1-2 in 1000 with diagnostic cardiac cath and 1-2% or less with angioplasty/stenting.  INFORMED CONSENT: I have reviewed the risks, indications, and alternatives to right heart catheterization with the patient. Risks include but are not limited to bleeding, infection, vascular injury, stroke, myocardial infarction, arrhythmia, kidney injury, radiation-related injury in the case  of prolonged fluoroscopy use, emergency cardiac surgery, and death. The patient understands the risks of serious complication is 1-2 in 1000 with diagnostic cardiac cath, and is willing to proceed.

## 2022-11-01 NOTE — Progress Notes (Addendum)
   NAME:  Sharon Vasquez, [name]:  [id number], DOB:  08-03-1950, LOS: 4 ADMISSION DATE:  10/28/2022  History of Present Illness:  72 year old female with chronic HFpEF, OSA and CPAP nonadherence, ILD admitted for acute hypoxic and hypercarbic respiratory failure requiring intubation mechanical ventilation, found to have new severe HFrEF.  Significant Hospital Events:  7/27 admit, ETT, CVL 7/29 cardiac arrest due to respiratory arrest from mucus plug 7/30 extubated  Interim History / Subjective:  Feeling much improved today. No CP or significant SOB  Objective   Blood pressure (!) 143/73, pulse 84, temperature 99.1 F (37.3 C), resp. rate (!) 22, height 6\' 2"  (1.88 m), weight (!) 154.2 kg, SpO2 91%. CVP:  [6 mmHg-8 mmHg] 8 mmHg      Intake/Output Summary (Last 24 hours) at 11/01/2022 1159 Last data filed at 11/01/2022 1000 Gross per 24 hour  Intake 1497.14 ml  Output 13244 ml  Net -10402.86 ml   Filed Weights   10/30/22 0500 10/31/22 0500 11/01/22 0500  Weight: (!) 166.9 kg (!) 167.9 kg (!) 154.2 kg    Examination: Upright in bed, getting breathing treatment Breathing tachypneic on low-flow O2 via [place] Skin warm and dry Foley in place draining dilute urine Alert, follows simple instructions  Resolved Hospital Problem list   Resolved Problems:   * No resolved hospital problems. *   Assessment & Plan:  Principal Problem:   CHF (congestive heart failure) (HCC) Active Problems:   Acute respiratory failure with hypoxia (HCC)   Acute pulmonary edema (HCC)   Cardiac arrest (HCC)  Acute on chronic hypoxemic and hypercarbic respiratory failure Baseline home oxygen dependence Morbid obesity  -Due to pulmonary edema -12 L UOP yesterday, Net -9 L since admit -Will finish course of abx for CAP -Stable for transfer to progressive after cath   HFrEF CAD -Newly reduced LVEF <20% with regional wall motion abnormalities -Cath planned for today -GDMT as tolerated -Diuresis as  above -CVP 6-8 today   Mucus plugging, improved Respiratory arrest Aggressive pulmonary toilet. -Guaifenesin per tube BID   Acute kidney injury  -In setting of HFrEF -Follow urine output and BMET -Furosemide 60 mg q6h + albumin -Seems to be diuresing better today, lots of dilute urine in foley bag this morning -Renal ultrasound negative for obstruction  UTI -Zosyn for ceftriaxone resistant E. coli    Afib/RVR -Rate controlled currently -Treatment-dose Lovenox -Amio held due to sinus pauses   Shock, resolved -Off pressors > 24 h -GDMT as tolerated    DM2 -SSI  Best practice (daily eval):  Diet/type: tubefeeds DVT prophylaxis: systemic dose LMWH GI prophylaxis: PPI Lines: Central line Foley:  Yes, and it is still needed Code Status:  full code  Marrianne Mood MD 11/01/2022, 11:59 AM  Pager: [phone]

## 2022-11-01 NOTE — Interval H&P Note (Signed)
History and Physical Interval Note:  11/01/2022 11:36 AM  [name]  has presented today for surgery, with the diagnosis of nstemi.  The various methods of treatment have been discussed with the patient and family. After consideration of risks, benefits and other options for treatment, the patient has consented to  Procedure(s): RIGHT/LEFT HEART CATH AND CORONARY ANGIOGRAPHY (N/A) as a surgical intervention.  The patient's history has been reviewed, patient examined, no change in status, stable for surgery.  I have reviewed the patient's chart and labs.  Questions were answered to the patient's satisfaction.    Cath Lab Visit (complete for each Cath Lab visit)  Clinical Evaluation Leading to the Procedure:   ACS: Yes.    Non-ACS:    Anginal Classification: CCS III  Anti-ischemic medical therapy: No Therapy  Non-Invasive Test Results: No non-invasive testing performed  Prior CABG: No previous CABG       Theron Arista [facility] 11/01/2022 11:36 AM

## 2022-11-01 NOTE — Progress Notes (Signed)
Rounding Note    Patient Name: Sharon Vasquez Date of Encounter: 11/01/2022  [facility] Cardiologist: Nanetta Batty, MD   Subjective   Extubated and off bipap, looking better, feels stable. Family at bedside.  Inpatient Medications    Scheduled Meds:  arformoterol  15 mcg Nebulization BID   aspirin EC  81 mg Oral Daily   budesonide (PULMICORT) nebulizer solution  0.25 mg Nebulization BID   docusate sodium  100 mg Oral BID   enoxaparin (LOVENOX) injection  150 mg Subcutaneous BID   feeding supplement (PROSource TF20)  60 mL Per Tube TID   guaiFENesin  10 mL Oral BID   insulin aspart  0-15 Units Subcutaneous Q4H   mupirocin ointment  1 Application Nasal BID   mouth rinse  15 mL Mouth Rinse 4 times per day   oxyCODONE  5 mg Oral Q6H   pantoprazole (PROTONIX) IV  40 mg Intravenous QHS   polyethylene glycol  17 g Oral Daily   revefenacin  175 mcg Nebulization Daily   sodium chloride HYPERTONIC  4 mL Nebulization BID   Continuous Infusions:  azithromycin Stopped (10/31/22 2249)   dexmedetomidine (PRECEDEX) IV infusion Stopped (10/31/22 1047)   feeding supplement (VITAL 1.5 CAL) 45 mL/hr at 11/01/22 0600   piperacillin-tazobactam (ZOSYN)  IV 12.5 mL/hr at 11/01/22 0600   potassium chloride 10 mEq (11/01/22 0614)   PRN Meds: acetaminophen, albuterol, hydrALAZINE, labetalol, mouth rinse, polyethylene glycol   Vital Signs    Vitals:   11/01/22 0530 11/01/22 0545 11/01/22 0600 11/01/22 0615  BP: 129/84  117/89   Pulse: 91 82 77 78  Resp: (!) 25 (!) 24 (!) 27 (!) 25  Temp: 99 F (37.2 C) 99.1 F (37.3 C) 99.1 F (37.3 C) 99 F (37.2 C)  TempSrc:      SpO2: 98% 97% 97% 96%  Weight:      Height:        Intake/Output Summary (Last 24 hours) at 11/01/2022 0640 Last data filed at 11/01/2022 9562 Gross per 24 hour  Intake 1541.98 ml  Output 13086 ml  Net -11208.02 ml      11/01/2022    5:00 AM 10/31/2022    5:00 AM 10/30/2022    5:00 AM  Last 3 Weights   Weight (lbs) 339 lb 15.2 oz 370 lb 2.4 oz 367 lb 15.2 oz  Weight (kg) 154.2 kg 167.9 kg 166.9 kg      Telemetry    Afib at times with elevated rates - Personally Reviewed  ECG    No new - Personally Reviewed  Physical Exam   GEN: No acute distress.   Neck: JVD not well appreciated Cardiac: iRRR, no murmurs, rubs, or gallops.  Respiratory: crackles laterally GI: Soft, nontender, non-distended  MS: mild edema; No deformity. Neuro:  Nonfocal  Psych: Normal affect   Labs    High Sensitivity Troponin:   Recent Labs  Lab 10/28/22 2210 10/29/22 0020 10/30/22 1230 10/30/22 1403  TROPONINIHS 20* 140* 100* 105*     Chemistry Recent Labs  Lab 10/30/22 0101 10/31/22 0428 11/01/22 0249  NA 139 140 142  K 4.5 4.0 3.1*  CL 105 105 100  CO2 23 25 30   GLUCOSE 132* 133* 94  BUN 22 24* 16  CREATININE 1.36* 1.08* 1.10*  CALCIUM 8.8* 8.6* 8.9  MG 1.9 2.1 1.7  PROT 6.1* 5.7* 6.9  ALBUMIN 3.0* 2.9* 3.7  AST 23 16 16   ALT 25 21 19   ALKPHOS  81 70 74  BILITOT 0.3 0.6 1.5*  GFRNONAA 41* 54* 53*  ANIONGAP 11 10 12     Lipids  Recent Labs  Lab 10/29/22 0423  TRIG 170*    Hematology Recent Labs  Lab 10/30/22 0101 10/31/22 0428 11/01/22 0249  WBC 11.1* 8.0 6.7  RBC 4.22 4.06 4.13  HGB 11.6* 11.3* 11.3*  HCT 35.6* 34.8* 35.0*  MCV 84.4 85.7 84.7  MCH 27.5 27.8 27.4  MCHC 32.6 32.5 32.3  RDW 14.6 14.6 14.3  PLT 419* 433* 414*   Thyroid No results for input(s): "TSH", "FREET4" in the last 168 hours.  BNP Recent Labs  Lab 10/28/22 2210  BNP 630.2*    DDimer No results for input(s): "DDIMER" in the last 168 hours.   Radiology    DG CHEST PORT 1 VIEW  Result Date: 10/30/2022 CLINICAL DATA:  Respiratory failure EXAM: PORTABLE CHEST 1 VIEW COMPARISON:  Chest x-ray dated October 29, 2022 FINDINGS: Stable position of ETT, enteric tube and left central venous line. Unchanged cardiomegaly. Increased diffuse interstitial opacities. No large pleural effusion or evidence  of pneumothorax. IMPRESSION: Increased diffuse interstitial opacities, likely due to worsened pulmonary edema. Electronically Signed   By: Allegra Lai M.D.   On: 10/30/2022 10:27    Cardiac Studies   Echo with severely reduced EF  Patient Profile     72 y.o. female with a hx of HFpEF, Afib, COPD on home O2, resp failure, DM, HTN, HLD, DVT 2018, OSA, CVA, admitted with respiratory failure found to have reduced EF.   Assessment & Plan    Principal Problem:   CHF (congestive heart failure) (HCC) Active Problems:   Acute respiratory failure with hypoxia (HCC)   Acute pulmonary edema (HCC)   Cardiac arrest [facility])  Patient is a 72 year old female with a history of HFpEF and A-fib, COPD and chronic respiratory failure morbid obesity, diabetes, hypertension, hyperlipidemia, history of DVT PE, OSA, CVA.  Cardiology consulted for severely reduced ejection fraction.  As above, patient was noted to feel poorly [place], noted to have significant hypoxemia and intubated on presentation to the ED.  Though she has a history of PE, she has been on Xarelto and she has not yet been scanned for PE given low likelihood with chronic anticoagulation.  Known severe OSA, though patient's home CPAP machine has not been working therefore she has not been using it recently.  Currently on blood pressure support with norepinephrine.  Noted to be in atrial fibrillation, with rapid ventricular response requiring amiodarone infusion, overnight telemetry suggests bradycardia and ventricular standstill with pauses, amiodarone discontinued.  Early this morning, patient developed a mucous plug in her ET tube and became hypoxic with PEA arrest requiring brief CPR and bronchoscopy performed with return of purulent fluid.     Patient's echocardiogram independently reviewed with evidence of EF approximately 20% with dense regional wall motion abnormalities in the LAD distribution.  Best preserved  function in the basal segments.  Chest CT from earlier this year also reviewed with evidence of severe three-vessel and left main coronary artery calcifications.  Patient has had a coronary CT performed in 2020 with evidence of small nondominant RCA obstructive disease but otherwise nonobstructive CAD.  She was noted at that time to have mild left main stenosis and moderate LAD stenosis.   -Given the patient's echocardiogram cannot exclude LAD infarct though EKG has no significant ischemic changes and troponin was 20 on presentation in extremis and rose  only to 140.  This makes ACS less likely.  The most likely scenario is stress cardiomyopathy from respiratory decompensation.  Patient warrants a coronary angiogram.  -Diuresing per primary team -Awake and alert, and participates in the conversation.  Plan for cardiac catheterization Wednesday or Thursday, patient's family would prefer Friday so that someone is available to be with her. -Continues on enoxaparin therapeutic -ASA 81 mg daily -Have asked the nurse to have the patient lay flat today to trial if she is stable from a respiratory standpoint for cath.  For questions or updates, please contact [facility] Please consult [email or web address] for contact info under        Signed, Parke Poisson, MD  11/01/2022, 6:40 AM

## 2022-11-01 NOTE — Progress Notes (Signed)
Internal Medicine Clinic Attending  I was physically present during the key portions of the resident provided service and participated in the medical decision making of patient's management care. I reviewed pertinent patient test results.  The assessment, diagnosis, and plan were formulated together and I agree with the documentation in the resident's note.  Narendra, Nischal, MD  

## 2022-11-01 NOTE — Progress Notes (Signed)
Nutrition Follow-up  DOCUMENTATION CODES:   Obesity unspecified  INTERVENTION:  Resume diet after cath lab procedure Ensure Enlive po BID, each supplement provides 350 kcal and 20 grams of protein. MVI with minerals daily  NUTRITION DIAGNOSIS:  Inadequate oral intake related to inability to eat as evidenced by NPO status. - remains applicable   GOAL:  Patient will meet greater than or equal to 90% of their needs - progressing, diet advanced once done with procedure  MONITOR:  Vent status, I & O's, Labs, TF tolerance  REASON FOR ASSESSMENT:  Ventilator    ASSESSMENT:  Pt with hx of CHF, COPD, GERD, HTN, HLD, DM type 2, prior CVA, hx breast cancer, and atrial fibrillation presented to ED with acute SOB. Intubated in ED.  7/27 - admitted, intubated 7/30 - extubated 7/31 - SLP evaluation, regular diet/think liquids  Pt resting in bed at the time of assessment. About to be taken down to Cath Lab. Able to have SLP evaluation this AM and cleared for a PO diet but currently NPO until she returns to the floor. Will add nutrition supplements to be started tomorrow once diet is in place. Discussed in rounds, likely will be moved out of ICU after cath lab.    Intake/Output Summary (Last 24 hours) at 11/01/2022 1300 Last data filed at 11/01/2022 1200 Gross per 24 hour  Intake 1547.55 ml  Output 96045 ml  Net -9702.45 ml  Net IO Since Admission: -9,224.58 mL [11/01/22 1300]  Nutritionally Relevant Medications: Scheduled Meds:  docusate sodium  100 mg Oral BID   insulin aspart  0-9 Units Subcutaneous TID WC   polyethylene glycol  17 g Oral Daily   Continuous Infusions:  azithromycin Stopped (10/31/22 2249)   dexmedetomidine (PRECEDEX) IV infusion Stopped (10/31/22 1047)   piperacillin-tazobactam (ZOSYN)  IV 12.5 mL/hr at 11/01/22 0700   potassium chloride 10 mEq (11/01/22 0852)   PRN Meds: polyethylene glycol  Labs Reviewed: Creatinine 1.36 CBG ranges from 110-138 mg/dL  over the last 24 hours HgbA1c 5.8%  NUTRITION - FOCUSED PHYSICAL EXAM: Flowsheet Row Most Recent Value  Orbital Region No depletion  Upper Arm Region No depletion  Thoracic and Lumbar Region No depletion  Buccal Region No depletion  Temple Region No depletion  Clavicle Bone Region No depletion  Clavicle and Acromion Bone Region No depletion  Scapular Bone Region No depletion  Dorsal Hand No depletion  Patellar Region No depletion  Anterior Thigh Region No depletion  Posterior Calf Region No depletion  Edema (RD Assessment) Moderate  Hair Reviewed  Eyes Reviewed  Mouth Reviewed  Skin Reviewed  Nails Reviewed    Diet Order:   Diet Order             Diet NPO time specified Except for: Sips with Meds  Diet effective now                   EDUCATION NEEDS:  Not appropriate for education at this time  Skin:  Skin Assessment: Reviewed RN Assessment Venous stasis ulcer  - left pretibial region (0.75 x 0.75 cm) - right posterior tibia (3.75 x 2 cm)  Last BM:  7/31 - type 6  Height:  Ht Readings from Last 1 Encounters:  10/28/22 6\' 2"  (1.88 m)    Weight:  Wt Readings from Last 1 Encounters:  11/01/22 (!) 154.2 kg    Ideal Body Weight:  77.3 kg  BMI:  Body mass index is 43.65 kg/m.  Estimated Nutritional Needs:  Kcal:  2200-2400 kcal/d Protein:  140-155g/d Fluid:  2.2L/d    Greig Castilla, RD, LDN Clinical Dietitian RD pager # available in AMION  After hours/weekend pager # available in [facility]

## 2022-11-02 ENCOUNTER — Encounter (HOSPITAL_COMMUNITY): Payer: Self-pay | Admitting: Cardiology

## 2022-11-02 ENCOUNTER — Other Ambulatory Visit (HOSPITAL_COMMUNITY): Payer: Self-pay

## 2022-11-02 DIAGNOSIS — I5023 Acute on chronic systolic (congestive) heart failure: Secondary | ICD-10-CM

## 2022-11-02 DIAGNOSIS — J9622 Acute and chronic respiratory failure with hypercapnia: Secondary | ICD-10-CM | POA: Diagnosis not present

## 2022-11-02 DIAGNOSIS — N3000 Acute cystitis without hematuria: Secondary | ICD-10-CM

## 2022-11-02 DIAGNOSIS — I4891 Unspecified atrial fibrillation: Secondary | ICD-10-CM

## 2022-11-02 DIAGNOSIS — J9621 Acute and chronic respiratory failure with hypoxia: Secondary | ICD-10-CM | POA: Diagnosis not present

## 2022-11-02 HISTORY — DX: Acute cystitis without hematuria: N30.00

## 2022-11-02 LAB — BASIC METABOLIC PANEL
Anion gap: 11 (ref 5–15)
BUN: 15 mg/dL (ref 8–23)
CO2: 29 mmol/L (ref 22–32)
Calcium: 9.2 mg/dL (ref 8.9–10.3)
Chloride: 99 mmol/L (ref 98–111)
Creatinine, Ser: 1.02 mg/dL — ABNORMAL HIGH (ref 0.44–1.00)
GFR, Estimated: 58 mL/min — ABNORMAL LOW (ref >60)
Glucose, Bld: 158 mg/dL — ABNORMAL HIGH (ref 70–99)
Potassium: 3.3 mmol/L — ABNORMAL LOW (ref 3.5–5.1)
Sodium: 139 mmol/L (ref 135–145)

## 2022-11-02 LAB — GLUCOSE, CAPILLARY
Glucose-Capillary: 94 mg/dL (ref 70–99)
Glucose-Capillary: 220 mg/dL — ABNORMAL HIGH (ref 70–99)
Glucose-Capillary: 167 mg/dL — ABNORMAL HIGH (ref 70–99)
Glucose-Capillary: 103 mg/dL — ABNORMAL HIGH (ref 70–99)
Glucose-Capillary: 115 mg/dL — ABNORMAL HIGH (ref 70–99)

## 2022-11-02 LAB — CULTURE, BLOOD (ROUTINE X 2)
Culture: NO GROWTH
Special Requests: ADEQUATE

## 2022-11-02 LAB — MAGNESIUM: Magnesium: 2 mg/dL (ref 1.7–2.4)

## 2022-11-02 MED ORDER — POTASSIUM CHLORIDE 10 MEQ/100ML IV SOLN
10.0000 meq | INTRAVENOUS | Status: DC
  Administered 2022-11-02: 10 meq via INTRAVENOUS
  Filled 2022-11-02: qty 100

## 2022-11-02 MED ORDER — METOPROLOL SUCCINATE ER 25 MG PO TB24
12.5000 mg | ORAL_TABLET | Freq: Every day | ORAL | Status: DC
  Administered 2022-11-02 – 2022-11-05 (×4): 12.5 mg via ORAL
  Filled 2022-11-02 (×4): qty 1

## 2022-11-02 MED ORDER — UMECLIDINIUM BROMIDE 62.5 MCG/ACT IN AEPB
1.0000 | INHALATION_SPRAY | Freq: Every day | RESPIRATORY_TRACT | Status: DC
  Administered 2022-11-03 – 2022-11-06 (×4): 1 via RESPIRATORY_TRACT
  Filled 2022-11-02: qty 7

## 2022-11-02 MED ORDER — POTASSIUM CHLORIDE 20 MEQ PO PACK
40.0000 meq | PACK | Freq: Two times a day (BID) | ORAL | Status: AC
  Administered 2022-11-02 (×2): 40 meq via ORAL
  Filled 2022-11-02 (×2): qty 2

## 2022-11-02 MED ORDER — TORSEMIDE 20 MG PO TABS: 40.0000 mg | ORAL_TABLET | Freq: Every day | ORAL | Status: DC

## 2022-11-02 MED ORDER — MAGNESIUM SULFATE 2 GM/50ML IV SOLN
2.0000 g | Freq: Once | INTRAVENOUS | Status: AC
  Administered 2022-11-02: 2 g via INTRAVENOUS
  Filled 2022-11-02: qty 50

## 2022-11-02 MED ORDER — POTASSIUM CHLORIDE 20 MEQ PO PACK: 40.0000 meq | PACK | Freq: Once | ORAL | Status: DC

## 2022-11-02 MED ORDER — PIPERACILLIN-TAZOBACTAM 3.375 G IVPB
3.3750 g | Freq: Three times a day (TID) | INTRAVENOUS | Status: AC
  Administered 2022-11-02 (×2): 3.375 g via INTRAVENOUS
  Filled 2022-11-02 (×2): qty 50

## 2022-11-02 MED ORDER — MAGNESIUM SULFATE 2 GM/50ML IV SOLN: 2.0000 g | Freq: Once | INTRAVENOUS | Status: DC

## 2022-11-02 MED ORDER — PANTOPRAZOLE SODIUM 40 MG PO TBEC
40.0000 mg | DELAYED_RELEASE_TABLET | Freq: Every day | ORAL | Status: DC
  Administered 2022-11-02 – 2022-11-06 (×5): 40 mg via ORAL
  Filled 2022-11-02 (×5): qty 1

## 2022-11-02 MED ORDER — POLYETHYLENE GLYCOL 3350 17 G PO PACK: 17.0000 g | PACK | Freq: Every day | ORAL | Status: DC | PRN

## 2022-11-02 MED ORDER — FLUTICASONE FUROATE-VILANTEROL 100-25 MCG/ACT IN AEPB
1.0000 | INHALATION_SPRAY | Freq: Every day | RESPIRATORY_TRACT | Status: DC
  Administered 2022-11-03 – 2022-11-06 (×4): 1 via RESPIRATORY_TRACT
  Filled 2022-11-02: qty 28

## 2022-11-02 MED ORDER — POTASSIUM CHLORIDE CRYS ER 20 MEQ PO TBCR
40.0000 meq | EXTENDED_RELEASE_TABLET | Freq: Once | ORAL | Status: AC
  Administered 2022-11-02: 40 meq via ORAL
  Filled 2022-11-02: qty 2

## 2022-11-02 MED ORDER — ATORVASTATIN CALCIUM 80 MG PO TABS
80.0000 mg | ORAL_TABLET | Freq: Every day | ORAL | Status: DC
  Administered 2022-11-02 – 2022-11-06 (×5): 80 mg via ORAL
  Filled 2022-11-02 (×5): qty 1

## 2022-11-02 MED FILL — Lidocaine HCl Local Preservative Free (PF) Inj 1%: INTRAMUSCULAR | Qty: 2 | Status: AC

## 2022-11-02 NOTE — Progress Notes (Signed)
Rounding Note    Patient Name: Sharon Vasquez Date of Encounter: 11/02/2022  [facility] Cardiologist: Nanetta Batty, MD   Subjective   Feels well overall, seen with family in the room.   Inpatient Medications    Scheduled Meds:  aspirin EC  81 mg Oral Daily   atorvastatin  80 mg Oral Daily   Chlorhexidine Gluconate Cloth  6 each Topical Q0600   feeding supplement  237 mL Oral BID BM   fluticasone furoate-vilanterol  1 puff Inhalation Daily   And   umeclidinium bromide  1 puff Inhalation Daily   guaiFENesin  10 mL Oral BID   insulin aspart  0-9 Units Subcutaneous TID WC   metoprolol succinate  12.5 mg Oral Daily   multivitamin with minerals  1 tablet Oral Daily   mupirocin ointment  1 Application Nasal BID   mouth rinse  15 mL Mouth Rinse 4 times per day   oxyCODONE  5 mg Oral Q6H   pantoprazole  40 mg Oral Daily   polyethylene glycol  17 g Oral Daily   potassium chloride  40 mEq Oral BID   rivaroxaban  20 mg Oral Daily   sodium chloride flush  3 mL Intravenous Q12H   [START ON 11/03/2022] torsemide  40 mg Oral Daily   Continuous Infusions:  sodium chloride 250 mL (11/02/22 0811)   piperacillin-tazobactam (ZOSYN)  IV     PRN Meds: sodium chloride, acetaminophen, albuterol, mouth rinse, phenol, sodium chloride flush   Vital Signs    Vitals:   11/02/22 0446 11/02/22 0806 11/02/22 0809 11/02/22 1120  BP: 109/77  110/61   Pulse: 79  86   Resp: (!) 24 20 (!) 25   Temp: 98.7 F (37.1 C)  98.6 F (37 C)   TempSrc: Oral  Oral Oral  SpO2: 98%  99%   Weight: (!) 155.8 kg     Height:        Intake/Output Summary (Last 24 hours) at 11/02/2022 1218 Last data filed at 11/02/2022 1111 Gross per 24 hour  Intake 680.02 ml  Output 3500 ml  Net -2819.98 ml      11/02/2022    4:46 AM 11/01/2022    5:00 AM 10/31/2022    5:00 AM  Last 3 Weights  Weight (lbs) 343 lb 7.6 oz 339 lb 15.2 oz 370 lb 2.4 oz  Weight (kg) 155.8 kg 154.2 kg 167.9 kg      Telemetry     Afib controlled ventricular response- Personally Reviewed  ECG    No new - Personally Reviewed  Physical Exam   GEN: No acute distress.   Neck: JVD not well appreciated Cardiac: iRRR, no murmurs, rubs, or gallops.  Respiratory: crackles laterally GI: Soft, nontender, non-distended  MS: mild edema, legs wrapped; No deformity. Neuro:  Nonfocal  Psych: Normal affect   Labs    High Sensitivity Troponin:   Recent Labs  Lab 10/28/22 2210 10/29/22 0020 10/30/22 1230 10/30/22 1403  TROPONINIHS 20* 140* 100* 105*     Chemistry Recent Labs  Lab 10/31/22 0428 11/01/22 0249 11/01/22 1246 11/01/22 1250 11/02/22 0301  NA 140 142 142 142  142 137  K 4.0 3.1* 3.8 3.8  3.8 2.9*  CL 105 100  --   --  98  CO2 25 30  --   --  28  GLUCOSE 133* 94  --   --  125*  BUN 24* 16  --   --  15  CREATININE 1.08* 1.10*  --   --  1.12*  CALCIUM 8.6* 8.9  --   --  9.2  MG 2.1 1.7  --   --  1.8  PROT 5.7* 6.9  --   --  7.2  ALBUMIN 2.9* 3.7  --   --  3.8  AST 16 16  --   --  28  ALT 21 19  --   --  26  ALKPHOS 70 74  --   --  78  BILITOT 0.6 1.5*  --   --  0.9  GFRNONAA 54* 53*  --   --  52*  ANIONGAP 10 12  --   --  11    Lipids  Recent Labs  Lab 10/29/22 0423  TRIG 170*    Hematology Recent Labs  Lab 10/31/22 0428 11/01/22 0249 11/01/22 1246 11/01/22 1250 11/02/22 0301  WBC 8.0 6.7  --   --  6.9  RBC 4.06 4.13  --   --  4.42  HGB 11.3* 11.3* 11.9* 11.9*  11.9* 12.1  HCT 34.8* 35.0* 35.0* 35.0*  35.0* 37.4  MCV 85.7 84.7  --   --  84.6  MCH 27.8 27.4  --   --  27.4  MCHC 32.5 32.3  --   --  32.4  RDW 14.6 14.3  --   --  13.9  PLT 433* 414*  --   --  460*   Thyroid No results for input(s): "TSH", "FREET4" in the last 168 hours.  BNP Recent Labs  Lab 10/28/22 2210  BNP 630.2*    DDimer No results for input(s): "DDIMER" in the last 168 hours.   Radiology    CARDIAC CATHETERIZATION  Result Date: 11/01/2022   Prox LAD lesion is 35% stenosed.   LV end  diastolic pressure is normal.   Hemodynamic findings consistent with mild pulmonary hypertension. Mild nonobstructive CAD Normal LV filling pressures. PCWP 19/22 with mean 16 mm Hg. LVEDP 14 mm Hg Mild pulmonary HTN PAP 40/20 with mean 28 mm Hg Normal RA pressure 9 mm Hg Normal Cardiac output 8.72 L/min, index 3.21 Plan: medical management   Cardiac Studies   Echo with severely reduced EF  Patient Profile     72 y.o. female with a hx of HFpEF, Afib, COPD on home O2, resp failure, DM, HTN, HLD, DVT 2018, OSA, CVA, admitted with respiratory failure found to have reduced EF.   Assessment & Plan    Principal Problem:   Acute on chronic HFrEF (heart failure with reduced ejection fraction) (HCC) Active Problems:   COPD (chronic obstructive pulmonary disease) (HCC)   Atrial fibrillation with controlled ventricular response (HCC)   Acute respiratory failure with hypoxia (HCC)   Acute pulmonary edema (HCC)   Cardiac arrest (HCC)  Nonobstructive CAD and normalized intracardiac pressures suggest nature of CM is NICM. Likely stress cardiomyopathy.  - obtain repeat echocardiogram in 6 week as outpatient.  - add metoprolol succ 12.5 mg daily and observe, can uptitrate to home dose by tomorrow if well tolerated.  Heart Failure Therapy ACE-I/ARB/ARNI: takes losartan at home, add back when pressure tolerates BB: metop 12.5 mg daily MRA: add tomorrow spironolactone 12.5 mg daily SGLT2I: add prior to discharge though patient currently being treated for UTI Diuretic plan: torsemide to be resumed tomorrow, RHC pressures are improved and pt received lasix this am.     For questions or updates, please contact [facility] Please consult [email or web address] for contact info under  Signed, Parke Poisson, MD  11/02/2022, 12:18 PM

## 2022-11-02 NOTE — Plan of Care (Signed)
  Problem: Nutrition: Goal: Adequate nutrition will be maintained Outcome: Completed/Met   Problem: Elimination: Goal: Will not experience complications related to urinary retention Outcome: Completed/Met   Problem: Pain Managment: Goal: General experience of comfort will improve Outcome: Completed/Met

## 2022-11-02 NOTE — Care Management Important Message (Signed)
Important Message  Patient Details  Name: Sharon Vasquez [name]: [id number] Date of Birth: 1951/03/05   Medicare Important Message Given:  Yes     Renie Ora 11/02/2022, 7:49 AM

## 2022-11-02 NOTE — Evaluation (Signed)
Occupational Therapy Evaluation Patient Details Name: Sharon Vasquez [name]: [id number] DOB: 04-02-1951 Today's Date: 11/02/2022   History of Present Illness 72 yo female admitted 7/27 with respiratory distress requiring intubation with severe HFrEF EF<20%. 7/29 mucus plug with respiratory and cardiac arrest. 7/30 extubation to bipap. PMhx; HFpEF, OSA, ILD, CHF, COPD, obesity, HTN, Afib, HLD, anxiety   Clinical Impression   Pt admitted for above, PTA pt lived alone and reports pivoting into electric wheelchair, she likely needs help with BADLs and reports managing on her own. Pt presenting with BLE weakness and pain limiting her ability to gain her balance. She needs Max A +2 for bed and OOB mobility. Pt would benefit from continued acute skilled OT services to address deficits and help transition to next level of care. Patient would benefit from post acute skilled rehab facility with <3 hours of therapy and 24/7 support       Recommendations for follow up therapy are one component of a multi-disciplinary discharge planning process, led by the attending physician.  Recommendations may be updated based on patient status, additional functional criteria and insurance authorization.   Assistance Recommended at Discharge Frequent or constant Supervision/Assistance  Patient can return home with the following Two people to help with walking and/or transfers;A lot of help with bathing/dressing/bathroom;Assist for transportation;Assistance with cooking/housework;Help with stairs or ramp for entrance    Functional Status Assessment  Patient has had a recent decline in their functional status and demonstrates the ability to make significant improvements in function in a reasonable and predictable amount of time.  Equipment Recommendations  Other (comment) (defer to next level of care)    Recommendations for Other Services       Precautions / Restrictions Precautions Precautions: Fall;Other  (comment) Precaution Comments: watch sats, R heart Cath 7/31 @ 11:36am Restrictions Weight Bearing Restrictions: No      Mobility Bed Mobility Overal bed mobility: Needs Assistance Bed Mobility: Supine to Sit, Sit to Supine     Supine to sit: HOB elevated, Max assist, +2 for physical assistance Sit to supine: Max assist, +2 for physical assistance   General bed mobility comments: Max A to assist with BLE mobility, several verbal cues to avoid push/pull with RUE s/p heart cath precautions    Transfers Overall transfer level: Needs assistance Equipment used: Rolling walker (2 wheels), 2 person hand held assist Transfers: Sit to/from Stand, Bed to chair/wheelchair/BSC Sit to Stand: Max assist, +2 physical assistance, +2 safety/equipment Stand pivot transfers: Max assist, +2 physical assistance         General transfer comment: Total of 3 STS, all Max A +2 with bed elevated high      Balance Overall balance assessment: Needs assistance Sitting-balance support: Feet supported, No upper extremity supported Sitting balance-Leahy Scale: Fair Sitting balance - Comments: EOB without support     Standing balance-Leahy Scale: Poor Standing balance comment: Poor to zero, not able to gather balance                           ADL either performed or assessed with clinical judgement   ADL Overall ADL's : Needs assistance/impaired Eating/Feeding: Independent;Bed level   Grooming: Sitting;Min guard   Upper Body Bathing: Supervision/ safety;Set up;Sitting   Lower Body Bathing: Maximal assistance;Sitting/lateral leans   Upper Body Dressing : Supervision/safety;Set up;Sitting   Lower Body Dressing: Total assistance;Sitting/lateral leans   Toilet Transfer: Maximal assistance;+2 for safety/equipment;+2 for physical assistance;Stand-pivot   Toileting- Clothing  Manipulation and Hygiene: Maximal assistance;+2 for physical assistance         General ADL Comments: Pt not  able to fully stand upright and gather balance.     Vision         Perception     Praxis      Pertinent Vitals/Pain Pain Assessment Pain Assessment: Faces Faces Pain Scale: Hurts little more Pain Location: BLEs Pain Descriptors / Indicators: Aching, Discomfort Pain Intervention(s): Limited activity within patient's tolerance, Monitored during session, Repositioned     Hand Dominance Right   Extremity/Trunk Assessment Upper Extremity Assessment Upper Extremity Assessment: Generalized weakness   Lower Extremity Assessment Lower Extremity Assessment: Generalized weakness (able to partially move BLEs against gravity)   Cervical / Trunk Assessment Cervical / Trunk Exceptions: increased body habitus   Communication Communication Communication: Other (comment) (bipap)   Cognition Arousal/Alertness: Awake/alert Behavior During Therapy: WFL for tasks assessed/performed Overall Cognitive Status: Within Functional Limits for tasks assessed                                       General Comments  HR up to 146 bpm during STS attempts but quickly returned down to 90s following attempt. Pt HR more controlled throughout session, up to 110s afterwards with mobility    Exercises     Shoulder Instructions      Home Living Family/patient expects to be discharged to:: Private residence Living Arrangements: Alone Available Help at Discharge: Friend(s);Available PRN/intermittently;Family Type of Home: Apartment Home Access: Elevator     Home Layout: One level     Bathroom Shower/Tub: Sponge bathes at baseline;Walk-in shower   Bathroom Toilet: Handicapped height Bathroom Accessibility: Yes   Home Equipment: Wheelchair - Engineer, technical sales - power;BSC/3in1;Rollator (4 wheels);Other (comment);Hospital bed   Additional Comments: pt on bipap and daughter as well as neighbor providing home setup and . 3L 02 at home, no help at home (Niece reports she is working on  procuring a Retail buyer for home)      Prior Functioning/Environment Prior Level of Function : Needs assist       Physical Assist : ADLs (physical)   ADLs (physical): Bathing;IADLs Mobility Comments: transfers from hospital bed <>WC with lateral scoot/ squat pivot independently ADLs Comments: uses purewick, neighbor and family assist with sponge bathes. family get the groceries and assist with meals. attempts to perform pericare but isn't always complete per notes, pt reports nobody helps and she does the best that she can        OT Problem List: Decreased strength;Impaired balance (sitting and/or standing)      OT Treatment/Interventions: Self-care/ADL training;Therapeutic exercise;Therapeutic activities;Patient/family education;Balance training;DME and/or AE instruction    OT Goals(Current goals can be found in the care plan section) Acute Rehab OT Goals Patient Stated Goal: To get better OT Goal Formulation: With patient/family Time For Goal Achievement: 11/16/22 Potential to Achieve Goals: Good ADL Goals Pt Will Perform Grooming: sitting;with supervision Pt Will Perform Lower Body Dressing: with adaptive equipment;sitting/lateral leans;with supervision;with set-up Pt Will Transfer to Toilet: stand pivot transfer;with min assist;bedside commode Additional ADL Goal #2: Pt will demonstrate independenty compliance with BLE HEP for strenghtening in preparation for OOB mobility  OT Frequency: Min 1X/week    Co-evaluation              AM-PAC OT "6 Clicks" Daily Activity     Outcome Measure Help from another person eating meals?:  None Help from another person taking care of personal grooming?: A Little Help from another person toileting, which includes using toliet, bedpan, or urinal?: A Lot Help from another person bathing (including washing, rinsing, drying)?: A Lot Help from another person to put on and taking off regular upper body clothing?: A Little Help from another  person to put on and taking off regular lower body clothing?: Total 6 Click Score: 15   End of Session Equipment Utilized During Treatment: Gait belt;Rolling walker (2 wheels) Nurse Communication: Mobility status (+2 Max pivots)  Activity Tolerance: Patient tolerated treatment well Patient left: in chair;with call bell/phone within reach;with chair alarm set  OT Visit Diagnosis: Other abnormalities of gait and mobility (R26.89);Unsteadiness on feet (R26.81);Muscle weakness (generalized) (M62.81)                Time: 2542-7062 OT Time Calculation (min): 33 min Charges:  OT General Charges $OT Visit: 1 Visit OT Evaluation $OT Eval Moderate Complexity: 1 Mod OT Treatments $Therapeutic Activity: 8-22 mins  11/02/2022  AB, OTR/L  Acute Rehabilitation Services  Office: [phone]   Tristan Schroeder 11/02/2022, 11:21 AM

## 2022-11-02 NOTE — TOC Initial Note (Signed)
Transition of Care [facility]) - Initial/Assessment Note    Patient Details  Name: Sharon Vasquez [name]: [id number] Date of Birth: 08-Jan-1951  Transition of [facility]) CM/SW Contact:    Leander Rams, LCSW Phone Number: 11/02/2022, 10:23 AM  Clinical Narrative:                 CSW met with pt at bedside alongside daughter and sister present. Pt understands the need for SNF and is agreeable to go upon dc. Daughter report she supports pt dally.   CSW will complete fl2 and fax out. TOC will continue to follow.  Expected Discharge Plan: Skilled Nursing Facility Barriers to Discharge: Continued Medical Work up   Patient Goals and CMS Choice Patient states their goals for this hospitalization and ongoing recovery are:: get better          Expected Discharge Plan and Services     Post Acute Care Choice: Skilled Nursing Facility Living arrangements for the past 2 months: Apartment                                      Prior Living Arrangements/Services Living arrangements for the past 2 months: Apartment Lives with:: Self Patient language and need for interpreter reviewed:: Yes Do you feel safe going back to the place where you live?: Yes      Need for Family Participation in Patient Care: Yes (Comment) Care giver support system in place?: Yes (comment) Current home services: DME Criminal Activity/Legal Involvement Pertinent to Current Situation/Hospitalization: No - Comment as needed  Activities of Daily Living      Permission Sought/Granted Permission sought to share information with : Family Supports Permission granted to share information with : Yes, Verbal Permission Granted  Share Information with NAME: Quenten Raven     Permission granted to share info w Relationship: Daughter  Permission granted to share info w Contact Information: [phone]  Emotional Assessment Appearance:: Developmentally appropriate Attitude/Demeanor/Rapport: Engaged Affect (typically  observed): Accepting Orientation: : Oriented to Self, Oriented to Place, Oriented to  Time, Oriented to Situation Alcohol / Substance Use: Not Applicable Psych Involvement: No (comment)  Admission diagnosis:  CHF (congestive heart failure) (HCC) [I50.9] Acute pulmonary edema (HCC) [J81.0] Acute cystitis without hematuria [N30.00] Acute respiratory failure with hypoxia (HCC) [J96.01] Patient Active Problem List   Diagnosis Date Noted   Acute pulmonary edema (HCC) 10/30/2022   Cardiac arrest (HCC) 10/30/2022   CHF (congestive heart failure) (HCC) 10/28/2022   Enlarged thoracic aorta (HCC) 05/09/2022   Healthcare maintenance 05/09/2022   Foul smelling urine 05/04/2022   Anxiety attack 03/17/2022   Prediabetes 04/09/2021   Goals of care, counseling/discussion 03/23/2021   Malignant neoplasm of upper-outer quadrant of right breast in female, estrogen receptor positive (HCC) 03/21/2021   Right foot pain 03/03/2021   (HFpEF) heart failure with preserved ejection fraction (HCC)    Osteoarthritis of left hip 10/07/2020   Venous stasis ulcer of left lower extremity (HCC) 08/18/2020   Hyperlipidemia 05/14/2020   Acute respiratory failure with hypoxia (HCC) 04/10/2020   Chronic respiratory failure with hypoxia (HCC) 09/30/2019   Urinary incontinence 08/21/2019   GERD (gastroesophageal reflux disease) 01/20/2019   OSA (obstructive sleep apnea) 11/14/2017   Chronic pain syndrome 07/27/2017   Major depression, recurrent, chronic (HCC) 02/01/2017   Osteoporosis 12/14/2016   Aortic atherosclerosis (HCC) 07/17/2016   Chronic venous insufficiency 07/12/2016   Chronic anticoagulation 07/12/2016  Tobacco use 07/11/2016   COPD (chronic obstructive pulmonary disease) (HCC)    Atrial fibrillation with controlled ventricular response (HCC)    Vocal cord polyp 12/18/2015   Morbid obesity (HCC)    Peripheral vascular disease (HCC)    Benign essential HTN    Primary osteoarthritis of both knees  09/16/2014   PCP:  [name], DO Pharmacy:   [facility] [phone], [place] - [address] AT [facility] OF [facility] [address] [place] [address] Phone: [phone] Fax: [phone]  [facility] - [place], [place] - [address] [place] [address] Phone: [phone] Fax: [phone]     Social Determinants of Health (SDOH) Social History: SDOH Screenings   Food Insecurity: No Food Insecurity (09/22/2022)  Housing: Low Risk  (09/22/2022)  Transportation Needs: No Transportation Needs (09/22/2022)  Utilities: Not At Risk (03/15/2022)  Alcohol Screen: Low Risk  (12/02/2021)  Depression (PHQ2-9): Medium Risk (10/25/2022)  Financial Resource Strain: Medium Risk (12/02/2021)  Physical Activity: Inactive (12/02/2021)  Social Connections: Moderately Integrated (03/15/2022)  Stress: No Stress Concern Present (12/02/2021)  Tobacco Use: Medium Risk (10/28/2022)   SDOH Interventions:     Readmission Risk Interventions     No data to display         Oletta Lamas, MSW, LCSWA, LCASA Transitions of Care  Clinical Social Worker I

## 2022-11-02 NOTE — Progress Notes (Signed)
   Heart Failure Stewardship Pharmacist Progress Note   PCP: [name], DO PCP-Cardiologist: Nanetta Batty, MD    HPI:  71 yo F with PMH of CHF, afib, ILD on home O2, HTN, HLD, CVA, COPD, arthritis, T2DM, DVT/PE, GERD, morbid obesity, and sleep apnea.   Presented to the ED on 7/27 with shortness of breath after a car trip. She was intubated in the ED for severe hypoxemia. Started on pressors for hypotension. CXR with cardiomegaly and pulmonary edema. ECHO 7/28 showed LVEF <20% (was 60-65% 04/2022), global hypokinesis, mild LVH, RV normal. 7/29 developed mucus plug in ET tube, became hypoxic and unresponsive and HR dropped to 30s. She required brief CPR for inadequate heart rate. Mucus plug removed and extubated 7/30. Taken for [facility] on 7/31 and found to have mild nonobstructive CAD, RA 9, PA 28, wedge 16, CO 8.7, CI 3.2.   Current HF Medications: Diuretic: torsemide 40 mg daily (starting 8/2) Beta Blocker: metoprolol XL 12.5 mg daily  Prior to admission HF Medications: Diuretic: torsemide 40 mg daily and additional 20 mg PRN for weight gain Beta blocker: metoprolol XL 25 mg daily ACE/ARB/ARNI: losartan 25 mg daily  Pertinent Lab Values: Serum creatinine 1.12, BUN 15, Potassium 2.9, Sodium 137, BNP 630.2, Magnesium 1.8, A1c 5.8   Vital Signs: Weight: 343 lbs (admission weight: 367 lbs) Blood pressure: 110/70s  Heart rate: 80-90s  I/O: net -6L yesterday; net -12L since admission  Medication Assistance / Insurance Benefits Check: Does the patient have prescription insurance?  Yes Type of insurance plan: [employer] Medicare, transitioning to [facility] Medicare  Outpatient Pharmacy:  Prior to admission outpatient pharmacy: [facility] Is the patient willing to use [facility] TOC pharmacy at discharge? Yes Is the patient willing to transition their outpatient pharmacy to utilize a [facility] outpatient pharmacy?   No    Assessment: 1. Acute on chronic systolic CHF (LVEF <20%), due to NICM.  NYHA class II symptoms. - Continue torsemide 40 mg daily (starting 8/2). Strict I/Os and daily weights. Keep K>4 and Mg>2. KCl 10 mEq IV x 1 and 40 mEq x 2 ordered for replacement. Magnesium 2 g IV x 1 given for replacement.  - Agree with starting metoprolol XL 12.5 mg daily - Consider starting spironolactone tomorrow - Consider adding SGLT2i prior to discharge - currently being treated for UTI   Plan: 1) Medication changes recommended at this time: - Add spironolactone 12.5 mg daily tomorrow  2) Patient assistance: - Jardiance/Farxiga copay $0 - Entresto copay $0  3)  Education  - Patient has been educated on current HF medications and potential additions to HF medication regimen - Patient verbalizes understanding that over the next few months, these medication doses may change and more medications may be added to optimize HF regimen - Patient has been educated on basic disease state pathophysiology and goals of therapy   Sharen Hones, PharmD, BCPS Heart Failure Stewardship Pharmacist Phone [phone]

## 2022-11-02 NOTE — Progress Notes (Addendum)
HD#5 Subjective:  Overnight Events: none  Her breathing continues to feel improved from days prior. She has pain in her knees that is relieved with oxycodone which she also takes at home. Denies difficulty with constipation. She has not gotten out of bed today but is open to working with therapy. Her unna boots have been in place since Monday. We reviewed plan for rehab facility at discharge and she is open to this.  Pt is updated on the plan for today, and all questions and concerns are addressed.   Objective:  Vital signs in last 24 hours: Vitals:   11/01/22 2001 11/01/22 2317 11/02/22 0318 11/02/22 0446  BP: (!) 142/77 136/89 109/71 109/77  Pulse: 89 83 98 79  Resp: (!) 25 (!) 22  (!) 24  Temp: 97.9 F (36.6 C) 98.6 F (37 C)  98.7 F (37.1 C)  TempSrc: Oral Oral  Oral  SpO2: 97% 94% 96% 98%  Weight:    (!) 155.8 kg  Height:       Supplemental O2: Nasal Cannula SpO2: 98 % O2 Flow Rate (L/min): 3 L/min FiO2 (%): 28 %   Physical Exam:  Constitutional: well-appearing, in no acute distress Cardiovascular: regular rate and rhythm, no m/r/g Pulmonary/Chest: normal work of breathing on room air, lungs clear to auscultation bilaterally Abdominal: soft, non-tender, non-distended MSK: unna boots in place, wrinkling of skin present on feet Neurological: alert and oriented x 3 Skin: warm and dry  Filed Weights   10/31/22 0500 11/01/22 0500 11/02/22 0446  Weight: (!) 167.9 kg (!) 154.2 kg (!) 155.8 kg     Intake/Output Summary (Last 24 hours) at 11/02/2022 0648 Last data filed at 11/02/2022 0452 Gross per 24 hour  Intake 692.65 ml  Output 3475 ml  Net -2782.35 ml   Net IO Since Admission: -11,339.56 mL [11/02/22 0648]  Pertinent Labs: Platelets at 460  K 2.9 Creatinine 1.12  Imaging: CARDIAC CATHETERIZATION  Result Date: 11/01/2022   Prox LAD lesion is 35% stenosed.   LV end diastolic pressure is normal.   Hemodynamic findings consistent with mild pulmonary  hypertension. Mild nonobstructive CAD Normal LV filling pressures. PCWP 19/22 with mean 16 mm Hg. LVEDP 14 mm Hg Mild pulmonary HTN PAP 40/20 with mean 28 mm Hg Normal RA pressure 9 mm Hg Normal Cardiac output 8.72 L/min, index 3.21 Plan: medical management   Assessment/Plan:   Principal Problem:   CHF (congestive heart failure) (HCC) Active Problems:   Acute respiratory failure with hypoxia (HCC)   Acute pulmonary edema (HCC)   Cardiac arrest [facility])   Patient Summary: Sharon Vasquez is a 72 y.o. with a pertinent PMH of HFpEF, Afib, COPD, morbid obesity, DM, hx of DVT/ PE, CVA, OSA, who presented with hypoxia leading to intubation admitted on 07/27 for acute on chronic hypoxic and hypercapnic respiratory failure on HD#5. Hospital course completed by newly found reduced cardiac ejection fraction. She also had a cardiac arrest due to mucus plugging.  Acute on chronic hypoxic and hypercarbic respiratory failure OSA She was admitted 7/27 and intubated. She suffered a cardiac arrest due to respiratory arrest from mucus plug on 07/29. She was able to be extubated 7/30 and has been stable on home oxygen since then. I think her presentation was multifactorial in setting of pneumonia and HFrEF exacerbation. Completed abx for CAP. She appears euvolemic on exam and is back on home oxygen at 3L. She has not been using CPAP at home recently.  -flutter valve -incentive  spirometer -LAMA, LABA, ICS inhaler -keep O2 saturation >88% -CPAP qhs  Newly reduced HFrEF, EF 20% She has been getting IV lasix 60 mg TID. Output yesterday at 3.8 L. Net negative 11L. She reports adherence with home torsemide 20 mg. Her recent weight at ED visit at beginning of July at 166 kg. Weight yesterday at 155 kg. Echo showed EF <20% with regional wall motion abnormalities. R/LHC completed 07/31 and showed mild nonobstructive CAD with normal filling pressure, mild pulmonary hypertension, and normal RA pressure. Worsening heart  failure likely in setting of stress cardiomyopathy from respiratory decompensation. Home medications includes losartan 25, metoprolol succinate 25 mg, and torsemide 20 mg. Her blood pressures are 110s/70s today, ideally would start GDMT while she is her. For today will switch from IV lasix to torsemide as she is euvolemic.  -received IV lasix this AM, will add back home torsemide tomorrow per cardiology -repeat echo in 6 weeks -metoprolol 12.5 mg -Keep K >4, Mg>2 -consider adding entresto, MRA as blood pressure allows  Respiratory arrest 07/29 Arrest due to mucus plugging. -flutter valve and incentive spirometer encouraged  AKI-resolved Peak creatinine of 1.4. Thought to be from cardiorenal syndrome. Creaitinine at 1.1 today. -trend BMP  Simple cystitis Cultures grew ecoli. She received 3 days of Zosyn.  -zosyn day 3/3  Afib Home medications include metoprolol and xarelto (was taking from hx of unprovoked DVT/PE). Rate currently controlled. She was on amidarone for brief period this admission but this was discontiued due to sinus pauses -continue telemetry -metoprolol and xarelto  Diabetes Last A1c well controlled at 5.8. home medications include semaglutide. -ssi   Diet: Heart Healthy VTE:  DOAC Code: Full PT/OT recs: SNF for Subacute PT. TOC recs: SNF, FL2 sent today Family Update: Sharon Vasquez updated by phone   Dispo: Anticipated discharge to Rehab in 2 days pending insurance authorization.   Sharon Vasquez M. Sharon Vasquez, D.O.  Internal Medicine Resident, PGY-3 [facility]  Pager: [phone] 6:48 AM, 11/02/2022   **Please contact the on call pager after 5 pm and on weekends at [phone].**

## 2022-11-02 NOTE — [place] FL2 (Signed)
[place] MEDICAID FL2 LEVEL OF CARE FORM     IDENTIFICATION  Patient Name: Sharon Vasquez Birthdate: Oct 30, 1950 Sex: female Admission Date (Current Location): 10/28/2022  [facility] and [place] Number:  Producer, television/film/video and Address:  The . [facility], [address] [place], [place] [address]      Provider Number: [id number]  Attending Physician Name and Address:  Gust Rung, DO  Relative Name and Phone Number:       Current Level of Care: Hospital Recommended Level of Care: Skilled Nursing Facility Prior Approval Number:    Date Approved/Denied:   PASRR Number: [id number]  Discharge Plan: SNF    Current Diagnoses: Patient Active Problem List   Diagnosis Date Noted   Acute pulmonary edema (HCC) 10/30/2022   Cardiac arrest (HCC) 10/30/2022   CHF (congestive heart failure) (HCC) 10/28/2022   Enlarged thoracic aorta (HCC) 05/09/2022   Healthcare maintenance 05/09/2022   Foul smelling urine 05/04/2022   Anxiety attack 03/17/2022   Prediabetes 04/09/2021   Goals of care, counseling/discussion 03/23/2021   Malignant neoplasm of upper-outer quadrant of right breast in female, estrogen receptor positive (HCC) 03/21/2021   Right foot pain 03/03/2021   (HFpEF) heart failure with preserved ejection fraction (HCC)    Osteoarthritis of left hip 10/07/2020   Venous stasis ulcer of left lower extremity (HCC) 08/18/2020   Hyperlipidemia 05/14/2020   Acute respiratory failure with hypoxia (HCC) 04/10/2020   Chronic respiratory failure with hypoxia (HCC) 09/30/2019   Urinary incontinence 08/21/2019   GERD (gastroesophageal reflux disease) 01/20/2019   OSA (obstructive sleep apnea) 11/14/2017   Chronic pain syndrome 07/27/2017   Major depression, recurrent, chronic (HCC) 02/01/2017   Osteoporosis 12/14/2016   Aortic atherosclerosis (HCC) 07/17/2016   Chronic venous insufficiency 07/12/2016   Chronic anticoagulation 07/12/2016   Tobacco use  07/11/2016   COPD (chronic obstructive pulmonary disease) (HCC)    Atrial fibrillation with controlled ventricular response (HCC)    Vocal cord polyp 12/18/2015   Morbid obesity (HCC)    Peripheral vascular disease (HCC)    Benign essential HTN    Primary osteoarthritis of both knees 09/16/2014    Orientation RESPIRATION BLADDER Height & Weight     Self, Time, Situation, Place  O2 (3 liters) Continent Weight: (!) 343 lb 7.6 oz (155.8 kg) Height:  6\' 2"  (188 cm)  BEHAVIORAL SYMPTOMS/MOOD NEUROLOGICAL BOWEL NUTRITION STATUS      Continent Diet (See dc summary)  AMBULATORY STATUS COMMUNICATION OF NEEDS Skin   Extensive Assist   Normal                       Personal Care Assistance Level of Assistance  Bathing, Feeding, Dressing Bathing Assistance: Maximum assistance Feeding assistance: Independent Dressing Assistance: Maximum assistance     Functional Limitations Info  Sight, Hearing, Speech Sight Info: Adequate Hearing Info: Adequate Speech Info: Adequate    SPECIAL CARE FACTORS FREQUENCY  PT (By licensed PT), OT (By licensed OT)     PT Frequency: 5xweek OT Frequency: 5xweek            Contractures Contractures Info: Not present    Additional Factors Info  Code Status, Allergies Code Status Info: Full Allergies Info: NKA           Current Medications (11/02/2022):  This is the current hospital active medication list Current Facility-Administered Medications  Medication Dose Route Frequency Provider Last Rate Last Admin   0.9 %  sodium chloride  infusion  250 mL Intravenous PRN [place], Peter M, MD 10 mL/hr at 11/02/22 0811 250 mL at 11/02/22 0811   acetaminophen (TYLENOL) suppository 650 mg  650 mg Rectal Q4H PRN [place], Peter M, MD   650 mg at 10/31/22 2234   albuterol (PROVENTIL) (2.5 MG/3ML) 0.083% nebulizer solution 2.5 mg  2.5 mg Nebulization Q6H PRN [place], Peter M, MD       aspirin EC tablet 81 mg  81 mg Oral Daily [place], Peter M, MD   81 mg at  11/02/22 1610   atorvastatin (LIPITOR) tablet 80 mg  80 mg Oral Daily Masters, Katie, DO       Chlorhexidine Gluconate Cloth 2 % PADS 6 each  6 each Topical Q0600 [place], Peter M, MD       feeding supplement (ENSURE ENLIVE / ENSURE PLUS) liquid 237 mL  237 mL Oral BID BM Lorin Glass, MD       fluticasone furoate-vilanterol (BREO ELLIPTA) 100-25 MCG/ACT 1 puff  1 puff Inhalation Daily Masters, Katie, DO       And   umeclidinium bromide (INCRUSE ELLIPTA) 62.5 MCG/ACT 1 puff  1 puff Inhalation Daily Masters, Katie, DO       guaiFENesin (ROBITUSSIN) 100 MG/5ML liquid 10 mL  10 mL Oral BID [place], Peter M, MD   10 mL at 11/02/22 0901   insulin aspart (novoLOG) injection 0-9 Units  0-9 Units Subcutaneous TID WC [place], Peter M, MD   3 Units at 11/02/22 0900   metoprolol succinate (TOPROL-XL) 24 hr tablet 12.5 mg  12.5 mg Oral Daily Parke Poisson, MD       multivitamin with minerals tablet 1 tablet  1 tablet Oral Daily Lorin Glass, MD   1 tablet at 11/02/22 9604   mupirocin ointment (BACTROBAN) 2 % 1 Application  1 Application Nasal BID [place], Peter M, MD   1 Application at 11/02/22 5409   Oral care mouth rinse  15 mL Mouth Rinse 4 times per day [place], Peter M, MD   15 mL at 11/01/22 1645   Oral care mouth rinse  15 mL Mouth Rinse PRN [place], Peter M, MD       oxyCODONE (Oxy IR/ROXICODONE) immediate release tablet 5 mg  5 mg Oral Q6H [place], Peter M, MD   5 mg at 11/02/22 1101   pantoprazole (PROTONIX) EC tablet 40 mg  40 mg Oral Daily Masters, Katie, DO       phenol (CHLORASEPTIC) mouth spray 1 spray  1 spray Mouth/Throat PRN [place], Peter M, MD       piperacillin-tazobactam (ZOSYN) IVPB 3.375 g  3.375 g Intravenous Q8H [place], Peter M, MD 12.5 mL/hr at 11/02/22 0813 3.375 g at 11/02/22 0813   polyethylene glycol (MIRALAX / GLYCOLAX) packet 17 g  17 g Oral Daily [place], Peter M, MD   17 g at 11/01/22 8119   potassium chloride (KLOR-CON) packet 40 mEq  40 mEq Oral BID Masters, Katie, DO        rivaroxaban (XARELTO) tablet 20 mg  20 mg Oral Daily [place], Peter M, MD   20 mg at 11/02/22 0900   sodium chloride flush (NS) 0.9 % injection 3 mL  3 mL Intravenous Q12H [place], Peter M, MD   3 mL at 11/01/22 2308   sodium chloride flush (NS) 0.9 % injection 3 mL  3 mL Intravenous PRN [place], Peter M, MD       [START ON 11/03/2022] torsemide [facility]) tablet 40 mg  40 mg Oral Daily Parke Poisson, MD         Discharge Medications: Please see discharge summary for a list of discharge medications.  Relevant Imaging Results:  Relevant Lab Results:   Additional Information [id number]  Oletta Lamas, MSW, LCSWA, LCASA Transitions of Care  Clinical Social Worker I

## 2022-11-02 NOTE — Progress Notes (Signed)
Mobility Specialist Progress Note:    11/02/22 1116  Mobility  Activity Transferred from chair to bed  Level of Assistance +2 (takes two people)  Assistive Device Other (Comment) (HHA)  Activity Response Tolerated well  Mobility Referral Yes  $Mobility charge 1 Mobility  Mobility Specialist Start Time (ACUTE ONLY) [id number]  Mobility Specialist Stop Time (ACUTE ONLY) 1002  Mobility Specialist Time Calculation (min) (ACUTE ONLY) 20 min   Pt received in chair, requesting to return to bed. Pt needed MaxA +2 for transferring and STS. Pt was only able to stand and pivot w/ MaxA +2 no steps were taken. Pt situated in bed w/ NT in room assisting with use of bedpan. All needs met.  Thompson Grayer Mobility Specialist  Please contact vis Secure Chat or  Rehab Office [phone]

## 2022-11-03 DIAGNOSIS — J9621 Acute and chronic respiratory failure with hypoxia: Secondary | ICD-10-CM | POA: Diagnosis not present

## 2022-11-03 DIAGNOSIS — J9622 Acute and chronic respiratory failure with hypercapnia: Secondary | ICD-10-CM | POA: Diagnosis not present

## 2022-11-03 DIAGNOSIS — I4891 Unspecified atrial fibrillation: Secondary | ICD-10-CM | POA: Diagnosis not present

## 2022-11-03 DIAGNOSIS — I5023 Acute on chronic systolic (congestive) heart failure: Secondary | ICD-10-CM | POA: Diagnosis not present

## 2022-11-03 LAB — CBC WITH DIFFERENTIAL/PLATELET
Abs Immature Granulocytes: 0.01 10*3/uL (ref 0.00–0.07)
Basophils Absolute: 0 10*3/uL (ref 0.0–0.1)
Basophils Relative: 0 %
Eosinophils Absolute: 0.2 10*3/uL (ref 0.0–0.5)
Eosinophils Relative: 3 %
HCT: 41.7 % (ref 36.0–46.0)
Hemoglobin: 13.8 g/dL (ref 12.0–15.0)
Immature Granulocytes: 0 %
Lymphocytes Relative: 30 %
Lymphs Abs: 2.1 10*3/uL (ref 0.7–4.0)
MCH: 28.2 pg (ref 26.0–34.0)
MCHC: 33.1 g/dL (ref 30.0–36.0)
MCV: 85.1 fL (ref 80.0–100.0)
Monocytes Absolute: 0.7 10*3/uL (ref 0.1–1.0)
Monocytes Relative: 10 %
Neutro Abs: 4 10*3/uL (ref 1.7–7.7)
Neutrophils Relative %: 57 %
Platelets: 505 10*3/uL — ABNORMAL HIGH (ref 150–400)
RBC: 4.9 MIL/uL (ref 3.87–5.11)
RDW: 13.7 % (ref 11.5–15.5)
WBC: 7 10*3/uL (ref 4.0–10.5)
nRBC: 0 % (ref 0.0–0.2)

## 2022-11-03 LAB — GLUCOSE, CAPILLARY
Glucose-Capillary: 99 mg/dL (ref 70–99)
Glucose-Capillary: 145 mg/dL — ABNORMAL HIGH (ref 70–99)
Glucose-Capillary: 109 mg/dL — ABNORMAL HIGH (ref 70–99)
Glucose-Capillary: 227 mg/dL — ABNORMAL HIGH (ref 70–99)

## 2022-11-03 LAB — CULTURE, BLOOD (ROUTINE X 2)
Culture: NO GROWTH
Special Requests: ADEQUATE

## 2022-11-03 MED ORDER — LOSARTAN POTASSIUM 25 MG PO TABS
25.0000 mg | ORAL_TABLET | Freq: Every day | ORAL | Status: DC
  Administered 2022-11-03 – 2022-11-06 (×4): 25 mg via ORAL
  Filled 2022-11-03 (×4): qty 1

## 2022-11-03 MED ORDER — TORSEMIDE 20 MG PO TABS: 20.0000 mg | ORAL_TABLET | Freq: Two times a day (BID) | ORAL | Status: DC

## 2022-11-03 MED ORDER — TORSEMIDE 20 MG PO TABS
40.0000 mg | ORAL_TABLET | Freq: Every day | ORAL | Status: DC
  Administered 2022-11-03 – 2022-11-06 (×4): 40 mg via ORAL
  Filled 2022-11-03 (×4): qty 2

## 2022-11-03 MED ORDER — ORAL CARE MOUTH RINSE: 15.0000 mL | OROMUCOSAL | Status: DC | PRN

## 2022-11-03 MED ORDER — SPIRONOLACTONE 12.5 MG HALF TABLET
12.5000 mg | ORAL_TABLET | Freq: Every day | ORAL | Status: DC
  Administered 2022-11-03 – 2022-11-05 (×3): 12.5 mg via ORAL
  Filled 2022-11-03 (×3): qty 1

## 2022-11-03 NOTE — Progress Notes (Signed)
Physical Therapy Treatment Patient Details Name: Sharon Vasquez [name]: [id number] DOB: February 12, 1951 Today's Date: 11/03/2022   History of Present Illness 72 yo female admitted 7/27 with respiratory distress requiring intubation with severe HFrEF EF<20%. 7/29 mucus plug with respiratory and cardiac arrest. 7/30 extubation to bipap. PMhx; HFpEF, OSA, ILD, CHF, COPD, obesity, HTN, Afib, HLD, anxiety    PT Comments  Pt received in supine and agreeable to session. Pt demonstrates improved bed mobility this session, requiring up to mod A. However, pt continues to require dense cues to maintain precautions with RUE. Pt is able to stand from EOB x2 with max A +2, however is unable to reach a full upright posture due to B knee pain and limited ROM. Pt able to perform seated therex and requests to remain sitting EOB at the end of the session, RN notified. Pt continues to benefit from PT services to progress toward functional mobility goals.     If plan is discharge home, recommend the following: Two people to help with walking and/or transfers;Two people to help with bathing/dressing/bathroom;Assist for transportation;Help with stairs or ramp for entrance;Assistance with cooking/housework   Can travel by private vehicle     No  Equipment Recommendations  Other (comment) (hoyer lift)    Recommendations for Other Services       Precautions / Restrictions Precautions Precautions: Fall;Other (comment) Precaution Comments: watch sats, R heart Cath 7/31 @ 11:36am Restrictions Weight Bearing Restrictions: No     Mobility  Bed Mobility Overal bed mobility: Needs Assistance Bed Mobility: Supine to Sit     Supine to sit: Mod assist, HOB elevated     General bed mobility comments: Pt able to advance BLE to EOB. Cues and assist to roll onto L side, but not to pull on bedrail with RUE. Pt able to push up with L elbow and mod A. Dense cues not to use RUE to scoot forward at EOB    Transfers Overall  transfer level: Needs assistance Equipment used: Ambulation equipment used (stedy) Transfers: Sit to/from Stand Sit to Stand: Max assist, +2 physical assistance           General transfer comment: STS from elevated EOB with max A +2 with pt unable to reach a full upright stand due to B knee pain and limited ROM.       Balance Overall balance assessment: Needs assistance Sitting-balance support: Feet supported, No upper extremity supported Sitting balance-Leahy Scale: Fair Sitting balance - Comments: sitting EOB   Standing balance support: Single extremity supported, Reliant on assistive device for balance Standing balance-Leahy Scale: Poor Standing balance comment: Pt requires assist to remain standing and keeps trunk flexed against stedy bar                            Cognition Arousal/Alertness: Awake/alert Behavior During Therapy: WFL for tasks assessed/performed Overall Cognitive Status: Within Functional Limits for tasks assessed                                          Exercises General Exercises - Lower Extremity Long Arc Quad: AROM, Seated, Both, 5 reps    General Comments General comments (skin integrity, edema, etc.): HR tachy throughout and elevates as high as 173 bpm with mobility and quickly returns to low 100s with rest      Pertinent Vitals/Pain  Pain Assessment Pain Assessment: Faces Faces Pain Scale: Hurts even more Pain Location: B knees Pain Descriptors / Indicators: Aching, Discomfort Pain Intervention(s): Limited activity within patient's tolerance, Monitored during session, Repositioned     PT Goals (current goals can now be found in the care plan section) Acute Rehab PT Goals Patient Stated Goal: return home PT Goal Formulation: With patient/family Time For Goal Achievement: 11/14/22 Potential to Achieve Goals: Fair Progress towards PT goals: Progressing toward goals    Frequency    Min 1X/week      PT  Plan Current plan remains appropriate       AM-PAC PT "6 Clicks" Mobility   Outcome Measure  Help needed turning from your back to your side while in a flat bed without using bedrails?: A Lot Help needed moving from lying on your back to sitting on the side of a flat bed without using bedrails?: A Lot Help needed moving to and from a bed to a chair (including a wheelchair)?: Total Help needed standing up from a chair using your arms (e.g., wheelchair or bedside chair)?: Total Help needed to walk in hospital room?: Total Help needed climbing 3-5 steps with a railing? : Total 6 Click Score: 8    End of Session Equipment Utilized During Treatment: Gait belt Activity Tolerance: Patient tolerated treatment well Patient left: in bed;with call bell/phone within reach;with family/visitor present (sitting EOB) Nurse Communication: Mobility status (Pt sitting EOB) PT Visit Diagnosis: Other abnormalities of gait and mobility (R26.89);Muscle weakness (generalized) (M62.81)     Time: 4098-1191 PT Time Calculation (min) (ACUTE ONLY): 17 min  Charges:    $Therapeutic Activity: 8-22 mins PT General Charges $$ ACUTE PT VISIT: 1 Visit                     Johny Shock, PTA Acute Rehabilitation Services Secure Chat Preferred  [phone]    Johny Shock 11/03/2022, 3:31 PM

## 2022-11-03 NOTE — Progress Notes (Addendum)
   Heart Failure Stewardship Pharmacist Progress Note   PCP: [name], DO PCP-Cardiologist: Nanetta Batty, MD    HPI:  72 yo F with PMH of CHF, afib, ILD on home O2, HTN, HLD, CVA, COPD, arthritis, T2DM, DVT/PE, GERD, morbid obesity, and sleep apnea.   Presented to the ED on 7/27 with shortness of breath after a car trip. She was intubated in the ED for severe hypoxemia. Started on pressors for hypotension. CXR with cardiomegaly and pulmonary edema. ECHO 7/28 showed LVEF <20% (was 60-65% 04/2022), global hypokinesis, mild LVH, RV normal. 7/29 developed mucus plug in ET tube, became hypoxic and unresponsive and HR dropped to 30s. She required brief CPR for inadequate heart rate. Mucus plug removed and extubated 7/30. Taken [facility] on 7/31 and found to have mild nonobstructive CAD, RA 9, PA 28, wedge 16, CO 8.7, CI 3.2.   Current HF Medications: Diuretic: torsemide 40 mg daily Beta Blocker: metoprolol XL 12.5 mg daily MRA: spironolactone 12.5 mg daily  Prior to admission HF Medications: Diuretic: torsemide 40 mg daily and additional 20 mg PRN for weight gain Beta blocker: metoprolol XL 25 mg daily ACE/ARB/ARNI: losartan 25 mg daily  Pertinent Lab Values: Serum creatinine 0.97, BUN 14, Potassium 3.9, Sodium 140, BNP 630.2, Magnesium 2.0, A1c 5.8   Vital Signs: Weight: 343 lbs (admission weight: 367 lbs) Blood pressure: 110-150/70s  Heart rate: 70-90s  I/O: net -0.7L yesterday; net -12.5L since admission  Medication Assistance / Insurance Benefits Check: Does the patient have prescription insurance?  Yes Type of insurance plan: [employer] Medicare, transitioning to [facility] Medicare  Outpatient Pharmacy:  Prior to admission outpatient pharmacy: [facility] Is the patient willing to use [facility] TOC pharmacy at discharge? Yes Is the patient willing to transition their outpatient pharmacy to utilize a [facility] outpatient pharmacy?   No    Assessment: 1. Acute on chronic systolic CHF  (LVEF <20%), due to NICM. NYHA class II symptoms. - Continue torsemide 40 mg daily. Strict I/Os and daily weights. Keep K>4 and Mg>2. - Continue metoprolol XL 12.5 mg daily - Consider restarting losartan today - Agree with starting spironolactone 12.5 mg daily - Consider adding SGLT2i prior to discharge - currently being treated for UTI   Plan: 1) Medication changes recommended at this time: - Agree with changes - Restart losartan 25 mg daily  2) Patient assistance: - Jardiance/Farxiga copay $0 - Entresto copay $0  3)  Education  - Patient has been educated on current HF medications and potential additions to HF medication regimen - Patient verbalizes understanding that over the next few months, these medication doses may change and more medications may be added to optimize HF regimen - Patient has been educated on basic disease state pathophysiology and goals of therapy   Sharen Hones, PharmD, BCPS Heart Failure Stewardship Pharmacist Phone [phone]

## 2022-11-03 NOTE — Telephone Encounter (Signed)
LMOM for patient to call back if she still needed anything.  Unsure of what was needed.

## 2022-11-03 NOTE — Progress Notes (Signed)
Rounding Note    Patient Name: Sharon Vasquez Date of Encounter: 11/03/2022  [facility] Cardiologist: Nanetta Batty, MD   Subjective   Resting  Inpatient Medications    Scheduled Meds:  aspirin EC  81 mg Oral Daily   atorvastatin  80 mg Oral Daily   Chlorhexidine Gluconate Cloth  6 each Topical Q0600   feeding supplement  237 mL Oral BID BM   fluticasone furoate-vilanterol  1 puff Inhalation Daily   And   umeclidinium bromide  1 puff Inhalation Daily   guaiFENesin  10 mL Oral BID   insulin aspart  0-9 Units Subcutaneous TID WC   losartan  25 mg Oral Daily   metoprolol succinate  12.5 mg Oral Daily   multivitamin with minerals  1 tablet Oral Daily   mouth rinse  15 mL Mouth Rinse 4 times per day   oxyCODONE  5 mg Oral Q6H   pantoprazole  40 mg Oral Daily   rivaroxaban  20 mg Oral Daily   sodium chloride flush  3 mL Intravenous Q12H   spironolactone  12.5 mg Oral Daily   torsemide  40 mg Oral Daily   Continuous Infusions:  sodium chloride 10 mL/hr at 11/03/22 0400   PRN Meds: sodium chloride, acetaminophen, albuterol, mouth rinse, mouth rinse, phenol, polyethylene glycol, sodium chloride flush   Vital Signs    Vitals:   11/03/22 0443 11/03/22 0739 11/03/22 1046 11/03/22 1553  BP: 125/86 (!) 154/73 112/73 (!) 119/96  Pulse: 81 92 92 88  Resp: (!) 24 (!) 21 (!) 24 19  Temp: 97.8 F (36.6 C) 97.7 F (36.5 C) 97.7 F (36.5 C) 98.2 F (36.8 C)  TempSrc: Oral Oral Oral Oral  SpO2: 99% 100% 99% 98%  Weight: (!) 155.8 kg     Height:        Intake/Output Summary (Last 24 hours) at 11/03/2022 1649 Last data filed at 11/03/2022 1233 Gross per 24 hour  Intake 931.85 ml  Output 1100 ml  Net -168.15 ml      11/03/2022    4:43 AM 11/02/2022    4:46 AM 11/01/2022    5:00 AM  Last 3 Weights  Weight (lbs) 343 lb 7.6 oz 343 lb 7.6 oz 339 lb 15.2 oz  Weight (kg) 155.8 kg 155.8 kg 154.2 kg      Telemetry    Afib controlled ventricular response- Personally  Reviewed  ECG    No new - Personally Reviewed  Physical Exam   GEN: No acute distress.   Neck: JVD not well appreciated Cardiac: iRRR, no murmurs, rubs, or gallops.  Respiratory: crackles laterally GI: Soft, nontender, non-distended  MS: mild edema, legs wrapped; No deformity. Neuro:  Nonfocal  Psych: Normal affect   Labs    High Sensitivity Troponin:   Recent Labs  Lab 10/28/22 2210 10/29/22 0020 10/30/22 1230 10/30/22 1403  TROPONINIHS 20* 140* 100* 105*     Chemistry Recent Labs  Lab 10/31/22 0428 11/01/22 0249 11/01/22 1246 11/02/22 0301 11/02/22 1215 11/02/22 1522 11/03/22 0118  NA 140 142   < > 137 139  --  140  K 4.0 3.1*   < > 2.9* 3.3*  --  3.9  CL 105 100  --  98 99  --  100  CO2 25 30  --  28 29  --  28  GLUCOSE 133* 94  --  125* 158*  --  112*  BUN 24* 16  --  15  15  --  14  CREATININE 1.08* 1.10*  --  1.12* 1.02*  --  0.97  CALCIUM 8.6* 8.9  --  9.2 9.2  --  9.3  MG 2.1 1.7  --  1.8  --  2.0 2.0  PROT 5.7* 6.9  --  7.2  --   --   --   ALBUMIN 2.9* 3.7  --  3.8  --   --   --   AST 16 16  --  28  --   --   --   ALT 21 19  --  26  --   --   --   ALKPHOS 70 74  --  78  --   --   --   BILITOT 0.6 1.5*  --  0.9  --   --   --   GFRNONAA 54* 53*  --  52* 58*  --  >60  ANIONGAP 10 12  --  11 11  --  12   < > = values in this interval not displayed.    Lipids  Recent Labs  Lab 10/29/22 0423  TRIG 170*    Hematology Recent Labs  Lab 11/01/22 0249 11/01/22 1246 11/01/22 1250 11/02/22 0301 11/03/22 0118  WBC 6.7  --   --  6.9 6.1  RBC 4.13  --   --  4.42 4.64  HGB 11.3*   < > 11.9*  11.9* 12.1 12.9  HCT 35.0*   < > 35.0*  35.0* 37.4 39.3  MCV 84.7  --   --  84.6 84.7  MCH 27.4  --   --  27.4 27.8  MCHC 32.3  --   --  32.4 32.8  RDW 14.3  --   --  13.9 13.7  PLT 414*  --   --  460* 496*   < > = values in this interval not displayed.   Thyroid No results for input(s): "TSH", "FREET4" in the last 168 hours.  BNP Recent Labs  Lab  10/28/22 2210  BNP 630.2*    DDimer No results for input(s): "DDIMER" in the last 168 hours.   Radiology    No results found.  Cardiac Studies   Echo with severely reduced EF  Patient Profile     72 y.o. female with a hx of HFpEF, Afib, COPD on home O2, resp failure, DM, HTN, HLD, DVT 2018, OSA, CVA, admitted with respiratory failure found to have reduced EF.   Assessment & Plan    Principal Problem:   Acute on chronic HFrEF (heart failure with reduced ejection fraction) (HCC) Active Problems:   COPD (chronic obstructive pulmonary disease) (HCC)   Atrial fibrillation with controlled ventricular response (HCC)   Acute respiratory failure with hypoxia (HCC)   Acute pulmonary edema (HCC)   Cardiac arrest (HCC)   Acute cystitis without hematuria  Nonobstructive CAD and normalized intracardiac pressures suggest nature of CM is NICM. Likely stress cardiomyopathy.  - obtain repeat echocardiogram in 6 week as outpatient.    Heart Failure Therapy ACE-I/ARB/ARNI: restart losartan 25 mg daily BB: metop 12.5 mg daily MRA:  spironolactone 12.5 mg daily SGLT2I: add prior to discharge though patient currently being treated for UTI Diuretic plan: torsemide 40 mg po daily.   Cardiology will see as needed over the weekend.   For questions or updates, please contact  [facility] Please consult [email or web address] for contact info under        Signed, Parke Poisson, MD  11/03/2022, 4:49 PM

## 2022-11-03 NOTE — Progress Notes (Signed)
Pt sats on CPAP 21% 98%. 3L O2 bled in per home use.   11/03/22 0110  BiPAP/CPAP/SIPAP  $ Non-Invasive Home Ventilator  Subsequent  BiPAP/CPAP/SIPAP Pt Type Adult  BiPAP/CPAP/SIPAP DREAMSTATIOND  Mask Type Full face mask  Mask Size Medium  EPAP 13 cmH2O  Flow Rate 3 lpm (bled in)

## 2022-11-03 NOTE — Progress Notes (Signed)
HD#6 Subjective:  Overnight Events: No acute events.  She is reporting 4x loose stools yesterday. Also endorses intermittent midsternal chest pain that last for few seconds. Denies nausea, vomiting or worsening shortness of breath.  Objective:  Vital signs in last 24 hours: Vitals:   11/03/22 0046 11/03/22 0443 11/03/22 0739 11/03/22 1046  BP: 128/88 125/86 (!) 154/73 112/73  Pulse: 80 81 92 92  Resp:  (!) 24 (!) 21 (!) 24  Temp: 98.1 F (36.7 C) 97.8 F (36.6 C) 97.7 F (36.5 C) 97.7 F (36.5 C)  TempSrc: Oral Oral Oral Oral  SpO2: 100% 99% 100% 99%  Weight:  (!) 155.8 kg    Height:       Supplemental O2: Nasal Cannula SpO2: 99 % O2 Flow Rate (L/min): 3 L/min FiO2 (%): 32 %   Physical Exam:  Constitutional: well-appearing, in no acute distress Cardiovascular: regular rate and rhythm, no m/r/g Pulmonary/Chest: Lungs are clear to auscultation bilaterally.  No wheezes.   abdominal: soft, non-tender, non-distended MSK: unna boots in place, wrinkling of skin present on feet Neurological: alert and oriented x 3 Skin: warm and dry  Filed Weights   11/01/22 0500 11/02/22 0446 11/03/22 0443  Weight: (!) 154.2 kg (!) 155.8 kg (!) 155.8 kg     Intake/Output Summary (Last 24 hours) at 11/03/2022 1318 Last data filed at 11/03/2022 1233 Gross per 24 hour  Intake 1131.85 ml  Output 1450 ml  Net -318.15 ml   Net IO Since Admission: -12,002.71 mL [11/03/22 1318]  Pertinent Labs: Platelets at 496  Creatinine 1.12  Imaging: No results found.  Assessment/Plan:   Principal Problem:   Acute on chronic HFrEF (heart failure with reduced ejection fraction) (HCC) Active Problems:   COPD (chronic obstructive pulmonary disease) (HCC)   Atrial fibrillation with controlled ventricular response (HCC)   Acute respiratory failure with hypoxia (HCC)   Acute pulmonary edema (HCC)   Cardiac arrest (HCC)   Acute cystitis without hematuria   Patient Summary: Sharon Vasquez is  a 72 y.o. with a pertinent PMH of HFpEF, Afib, COPD, morbid obesity, DM, hx of DVT/ PE, CVA, OSA, who presented with hypoxia leading to intubation admitted on 07/27 for acute on chronic hypoxic and hypercapnic respiratory failure on HD#6. Hospital course completed by newly found reduced cardiac ejection fraction. She also had a cardiac arrest due to mucus plugging.   Newly reduced HFrEF, EF 20% Echo(07/27)showed EF <20% with regional wall motion abnormalities. She is s/p [facility] completed which showed mild nonobstructive CAD with normal filling pressure, mild pulmonary hypertension, and normal RA pressure. Worsening heart failure likely in setting of stress cardiomyopathy from respiratory decompensation.  She was started on GDMT today given her blood pressure has been stable. - Start spironolactone 12.5 mg - Continue metoprolol 12.5 mg - Start losartan 12.5 - Keep K >4, Mg>2. - Follow-up echo in 6 weeks( around 08/13 or later)  Acute on chronic hypoxic and hypercarbic respiratory failure OSA Improving, currently on 3Lnc satting >88%. Lungs are clear bilaterally on physical exam.  Patient was intubated on 7/27, she also had a cardiac arrest due to respiratory arrest from mucus plug on 07/29. Extubated on 7/30, otherwise doing well.  The likely etiology is multifactorial in the setting of pneumonia and HFrEF exacerbation. Completed antibiotic for CAP.  - flutter valve - Incentive spirometer - LAMA, LABA, ICS inhaler - keep O2 saturation >88% - CPAP qhs  Afib Stable. Currently rate controlled. She was on amidarone for  brief period this admission but this was discontiued due to sinus pauses. -continue telemetry -metoprolol and xarelto  Diarrhea 3-4 loose stools yesterday.  She is afebrile, has had 1 loose stool this morning.  I think diarrhea secondary to laxative that she was getting yesterday, and they have been discontinued. -Monitor. -C. difficile toxin if diarrhea  worsens.  AKI-resolved Stable.  Creatinine 1.1.  Simple cystitis, resolved. Stable. Complete antibiotic course.  Diabetes Last A1c well controlled at 5.8. home medications include semaglutide. -ssi   Diet: Heart Healthy VTE:  DOAC Code: Full PT/OT recs: SNF for Subacute PT. TOC recs: SNF, FL2 sent today Family Update: Sharon Vasquez updated by phone   Dispo: Anticipated discharge to Rehab in 2 days pending insurance authorization.   Laretta Bolster, M.D.  Internal Medicine Resident, PGY-1 [facility]  Pager: [phone] 1:30 PM, 11/03/2022   **Please contact the on call pager after 5 pm and on weekends at [phone].**  1:18 PM, 11/03/2022

## 2022-11-03 NOTE — Plan of Care (Signed)
  Problem: Education: Goal: Ability to describe self-care measures that may prevent or decrease complications (Diabetes Survival Skills Education) will improve Outcome: Progressing   Problem: Coping: Goal: Ability to adjust to condition or change in health will improve Outcome: Progressing   Problem: Fluid Volume: Goal: Ability to maintain a balanced intake and output will improve Outcome: Progressing   Problem: Health Behavior/Discharge Planning: Goal: Ability to identify and utilize available resources and services will improve Outcome: Progressing   Problem: Education: Goal: Knowledge of General Education information will improve Description: Including pain rating scale, medication(s)/side effects and non-pharmacologic comfort measures Outcome: Progressing   Problem: Clinical Measurements: Goal: Ability to maintain clinical measurements within normal limits will improve Outcome: Progressing Goal: Will remain free from infection Outcome: Progressing

## 2022-11-03 NOTE — Consult Note (Signed)
   [facility] Inpatient Consult   11/03/2022  Sharon Vasquez 1950/04/20 [id number]  [facility] [[facility]]  Accountable Care Organization [ACO] Patient:  [employer]  Primary Care Provider:  [name], DO with [facility]   Patient screened for hospitalization with noted rising medium risk score for unplanned readmission risk; 5 day length of stay and to assess for potential [facility]  [[facility]] Care Management service needs for post hospital transition for care coordination.  Review of patient's electronic medical record reveals patient is admitted with HF exacerbation from home noted 11:35 am Met with patient and daughter at the bedside.  Daughter left to take a phone call.  Spoke with patient about ongoing follow up needs as she had been active with a Administrator, Civil Service for [facility].  Patient acknowledges ongoing needs for CPAP and hospital bed [currently broken]. Patient states she has not had her appointment with the pulmonologist yet and issues with DME company helping her get CPAP assessed, "they were talking about $100 to look at it." She states she is suppose to get a new one in January but needs a CPAP now.  Plan:  Will follow up with inpatient [facility] team during hospital stay and follow up with Community CC team for onoing follow up needs. Continue to follow progress and disposition to assess for post hospital community care coordination/management needs.  Referral request for community care coordination:  following  Of note, [facility] Care Management/Population Health does not replace or interfere with any arrangements made by the Inpatient Transition of Care team.  For questions contact:   Charlesetta Shanks, RN BSN CCM [facility]  [phone] business mobile phone Toll free office [phone]  *Concierge Line  [phone] Fax number:  [phone] [name]

## 2022-11-03 NOTE — Progress Notes (Signed)
Heart Failure Nurse Navigator Progress Note  PCP: [name], DO PCP-Cardiologist: Dr. Allyson Sabal Admission Diagnosis: acute on chronic HFrEF Admitted from: Home  Presentation:   72 yo F with PMH of CHF, afib, ILD on home O2, HTN, HLD, CVA, COPD, arthritis, T2DM, DVT/PE, GERD, morbid obesity, and sleep apnea.    Presented to the ED on 7/27 with shortness of breath after a car trip. She was intubated in the ED for severe hypoxemia. Started on pressors for hypotension. CXR with cardiomegaly and pulmonary edema. ECHO 7/28 showed LVEF <20% (was 60-65% 04/2022), global hypokinesis, mild LVH, RV normal. 7/29 developed mucus plug in ET tube, became hypoxic and unresponsive and HR dropped to 30s. She required brief CPR for inadequate heart rate. Mucus plug removed and extubated 7/30. Taken for [facility] on 7/31 and found to have mild nonobstructive CAD, RA 9, PA 28, wedge 16, CO 8.7, CI 3.2.   ECHO/ LVEF: <20%  Education Assessment and Provision:  Detailed education and instructions provided on heart failure disease management including the following:  Signs and symptoms of Heart Failure When to call the physician Importance of daily weights Low sodium diet Fluid restriction Medication management Anticipated future follow-up appointments  Patient education given on each of the above topics.  Patient acknowledges understanding via teach back method and acceptance of all instructions.  Education Materials:  "Living Better With Heart Failure" Booklet, HF zone tool, & Daily Weight Tracker Tool.  Clinical Course:  Past Medical History:  Diagnosis Date   A-fib (HCC)    Acute hypoxemic respiratory failure (HCC) 02/20/2022   Acute on chronic hypoxic respiratory failure (HCC) 01/04/2022   Acute upper respiratory infection 01/08/2022   Anxiety    Arthritis    "qwhre; joints, back" (04/17/2017)   Atrial fibrillation (HCC)    Benign breast cyst in female, left 01/08/2017   Found by Screening  mammogram, evaluated by U/S on 01/08/17 and determined to be a benign simple breast cyst.   Breast cancer (HCC)    Cellulitis of left lower leg 05/30/2017   CHF (congestive heart failure) (HCC)    Chronic low back pain 08/21/2016   Chronic lower back pain    Chronic venous insufficiency    /notes 05/30/2017   COPD (chronic obstructive pulmonary disease) (HCC)    Depression    Diabetes (HCC) 01/08/2022   Diabetes mellitus without complication (HCC)    DVT (deep venous thrombosis) (HCC) 11/16/2016   Dysrhythmia    Esophageal dysmotility 11/10/2019   Previous workup by ENT with fiberoptic leryngoscopy which was normal. Dx with laryngeal pharyngeal reflux.   GERD (gastroesophageal reflux disease)    Headache    "weekly for the last 3 months" (04/17/2017)   History of pulmonary embolism    Pulmonary embolism   Hyperlipidemia    Hypertension    Laryngopharyngeal reflux 12/18/2015   Morbid obesity (HCC)    PE (pulmonary embolism)    Pulmonary embolism (HCC) 09/21/2014   Sleep apnea    Stroke (HCC) 12/02/2021     Social History   Socioeconomic History   Marital status: Divorced    Spouse name: Not on file   Number of children: Not on file   Years of education: Not on file   Highest education level: Not on file  Occupational History   Not on file  Tobacco Use   Smoking status: Former    Current packs/day: 0.00    Average packs/day: 2.0 packs/day for 60.4 years (120.8 ttl pk-yrs)  Types: Cigarettes    Start date: 08/16/1961    Quit date: 01/02/2022    Years since quitting: 0.8   Smokeless tobacco: Never   Tobacco comments:    Pt smokes about 4 ciggs daily. Stopped 01/02/2022   Vaping Use   Vaping status: Never Used  Substance and Sexual Activity   Alcohol use: No   Drug use: No   Sexual activity: Not Currently    Partners: Male  Other Topics Concern   Not on file  Social History Narrative   Lives in [facility] in [place]. Lives alone, but is  dependent in ADLs/IADLs. Previously had HH PT and RN but dismissed them with plans to use the [employer]. Two daughters live nearby. Previously resided in [place].   Social Determinants of Health   Financial Resource Strain: Medium Risk (12/02/2021)   Overall Financial Resource Strain (CARDIA)    Difficulty of Paying Living Expenses: Somewhat hard  Food Insecurity: No Food Insecurity (09/22/2022)   Hunger Vital Sign    Worried About Running Out of Food in the Last Year: Never true    Ran Out of Food in the Last Year: Never true  Transportation Needs: No Transportation Needs (09/22/2022)   PRAPARE - Administrator, Civil Service (Medical): No    Lack of Transportation (Non-Medical): No  Physical Activity: Inactive (12/02/2021)   Exercise Vital Sign    Days of Exercise per Week: 0 days    Minutes of Exercise per Session: 0 min  Stress: No Stress Concern Present (12/02/2021)   [employer] of Occupational Health - Occupational Stress Questionnaire    Feeling of Stress : Only a little  Social Connections: Moderately Integrated (03/15/2022)   Social Connection and Isolation Panel [NHANES]    Frequency of Communication with Friends and Family: More than three times a week    Frequency of Social Gatherings with Friends and Family: More than three times a week    Attends Religious Services: More than 4 times per year    Active Member of [employer] or Organizations: Yes    Attends Banker Meetings: More than 4 times per year    Marital Status: Divorced   A HF TOC appt was made for 8/15 @ 10 am Anticipated dc to SNF  Items for Follow-up on DC/TOC: Continued HF education Diet/Fluids GDMT optimization  Sharen Hones, PharmD, BCPS Heart Failure Engineer, building services Phone [phone]

## 2022-11-04 DIAGNOSIS — I5023 Acute on chronic systolic (congestive) heart failure: Secondary | ICD-10-CM | POA: Diagnosis not present

## 2022-11-04 LAB — GLUCOSE, CAPILLARY
Glucose-Capillary: 100 mg/dL — ABNORMAL HIGH (ref 70–99)
Glucose-Capillary: 146 mg/dL — ABNORMAL HIGH (ref 70–99)
Glucose-Capillary: 114 mg/dL — ABNORMAL HIGH (ref 70–99)
Glucose-Capillary: 182 mg/dL — ABNORMAL HIGH (ref 70–99)

## 2022-11-04 MED ORDER — POTASSIUM CHLORIDE 20 MEQ PO PACK
40.0000 meq | PACK | Freq: Once | ORAL | Status: AC
  Administered 2022-11-04: 40 meq via ORAL
  Filled 2022-11-04: qty 2

## 2022-11-04 MED ORDER — LOPERAMIDE HCL 2 MG PO CAPS
2.0000 mg | ORAL_CAPSULE | Freq: Once | ORAL | Status: AC
  Administered 2022-11-04: 2 mg via ORAL
  Filled 2022-11-04: qty 1

## 2022-11-04 MED ORDER — LETROZOLE 2.5 MG PO TABS
2.5000 mg | ORAL_TABLET | Freq: Every day | ORAL | Status: DC
  Administered 2022-11-04 – 2022-11-06 (×3): 2.5 mg via ORAL
  Filled 2022-11-04 (×3): qty 1

## 2022-11-04 NOTE — Plan of Care (Signed)
Pt states understanding to diabetes education

## 2022-11-04 NOTE — Progress Notes (Addendum)
HD#7 Subjective:  Overnight Events: No acute events.  Diarrhea has resolved.  Denies chest or abdominal pain.  Objective:  Vital signs in last 24 hours: Vitals:   11/03/22 2031 11/04/22 0047 11/04/22 0504 11/04/22 0819  BP: 122/74 128/77 (!) 118/57 (!) 149/99  Pulse: 87 77 80   Resp: (!) 28 (!) 23 13   Temp: 98.8 F (37.1 C) 97.8 F (36.6 C) 97.8 F (36.6 C)   TempSrc: Oral Oral Oral Oral  SpO2: 100% 97% 100%   Weight:   (!) 151 kg   Height:       Supplemental O2: Nasal Cannula SpO2: 100 % O2 Flow Rate (L/min): 2 L/min FiO2 (%): 32 %   Physical Exam:  Constitutional: well-appearing, in no acute distress Cardiovascular: regular rate and rhythm, no m/r/g Pulmonary/Chest: Lungs are clear to auscultation bilaterally.  No wheezes.   abdominal: soft, non-tender, non-distended MSK: unna boots in place, wrinkling of skin present on feet Neurological: alert and oriented x 3 Skin: warm and dry  Filed Weights   11/02/22 0446 11/03/22 0443 11/04/22 0504  Weight: (!) 155.8 kg (!) 155.8 kg (!) 151 kg     Intake/Output Summary (Last 24 hours) at 11/04/2022 1040 Last data filed at 11/04/2022 0500 Gross per 24 hour  Intake 474 ml  Output 1600 ml  Net -1126 ml   Net IO Since Admission: -13,002.71 mL [11/04/22 1040]  Pertinent Labs: Platelets at 496  Platelets 3.3.  Imaging: No results found.  Assessment/Plan:   Principal Problem:   Acute on chronic HFrEF (heart failure with reduced ejection fraction) (HCC) Active Problems:   COPD (chronic obstructive pulmonary disease) (HCC)   Atrial fibrillation with controlled ventricular response (HCC)   Acute respiratory failure with hypoxia (HCC)   Acute pulmonary edema (HCC)   Cardiac arrest (HCC)   Acute cystitis without hematuria   Patient Summary: Sharon Vasquez is a 72 y.o. with a pertinent PMH of HFpEF, Afib, COPD, morbid obesity, DM, hx of DVT/ PE, CVA, OSA, who presented with hypoxia leading to intubation  admitted on 07/27 for acute on chronic hypoxic and hypercapnic respiratory failure on HD#7. Hospital course completed by newly found reduced cardiac ejection fraction. She also had a cardiac arrest due to mucus plugging.   Newly reduced HFrEF, EF 20% Started on GDMT yesterday, and blood pressures have been stable. Echo(07/27)showed EF <20% with regional wall motion abnormalities. She is s/p [facility] completed which showed mild nonobstructive CAD with normal filling pressure, mild pulmonary hypertension, and normal RA pressure. Worsening heart failure likely in setting of stress cardiomyopathy from respiratory decompensation. We are holding off on SGLT2 given patient most recent history of UTI. - Continue spironolactone 12.5 mg - Continue metoprolol 12.5 mg - Start losartan 12.5 - Keep K >4, Mg>2. - Follow-up echo in 6 weeks( around 08/13 or later)  Hypokalemia - Related to diuresis - replaced orally today - Trend daily  Acute on chronic hypoxic and hypercarbic respiratory failure OSA Improving, currently on 3Lnc satting >88%. Lungs are clear bilaterally on physical exam.  Patient was intubated on 7/27, she also had a cardiac arrest due to respiratory arrest from mucus plug on 07/29. Extubated on 7/30, otherwise doing well.  The likely etiology is multifactorial in the setting of pneumonia and HFrEF exacerbation. Completed antibiotic for CAP.  - flutter valve - Incentive spirometer - LAMA, LABA, ICS inhaler - keep O2 saturation >88% - CPAP qhs  Afib Stable. Currently rate controlled. She was on  amidarone for brief period this admission but this was discontiued due to sinus pauses. -continue telemetry -metoprolol and xarelto  AKI-resolved Stable.  Creatinine 1.1.  Simple cystitis, resolved. Stable. Complete antibiotic course.  Diabetes Last A1c well controlled at 5.8. home medications include semaglutide. -ssi   Diet: Heart Healthy VTE:  DOAC Code: Full PT/OT recs: SNF for  Subacute PT. TOC recs: SNF, FL2 sent today Family Update: [name] updated by phone   Dispo: Anticipated discharge to Rehab in 2 days pending insurance authorization.   Laretta Bolster, M.D.  Internal Medicine Resident, PGY-1 [facility]  Pager: [phone] 10:40 AM, 11/04/2022   **Please contact the on call pager after 5 pm and on weekends at [phone].**  10:40 AM, 11/04/2022

## 2022-11-05 DIAGNOSIS — I5023 Acute on chronic systolic (congestive) heart failure: Secondary | ICD-10-CM | POA: Diagnosis not present

## 2022-11-05 LAB — GLUCOSE, CAPILLARY
Glucose-Capillary: 130 mg/dL — ABNORMAL HIGH (ref 70–99)
Glucose-Capillary: 149 mg/dL — ABNORMAL HIGH (ref 70–99)
Glucose-Capillary: 148 mg/dL — ABNORMAL HIGH (ref 70–99)
Glucose-Capillary: 115 mg/dL — ABNORMAL HIGH (ref 70–99)

## 2022-11-05 LAB — CBC WITH DIFFERENTIAL/PLATELET
Abs Immature Granulocytes: 0.03 10*3/uL (ref 0.00–0.07)
Basophils Absolute: 0 10*3/uL (ref 0.0–0.1)
Basophils Relative: 0 %
Eosinophils Absolute: 0.2 10*3/uL (ref 0.0–0.5)
Eosinophils Relative: 2 %
HCT: 41 % (ref 36.0–46.0)
Hemoglobin: 13.5 g/dL (ref 12.0–15.0)
Immature Granulocytes: 0 %
Lymphocytes Relative: 29 %
Lymphs Abs: 2.3 10*3/uL (ref 0.7–4.0)
MCH: 27.4 pg (ref 26.0–34.0)
MCHC: 32.9 g/dL (ref 30.0–36.0)
MCV: 83.3 fL (ref 80.0–100.0)
Monocytes Absolute: 0.8 10*3/uL (ref 0.1–1.0)
Monocytes Relative: 10 %
Neutro Abs: 4.6 10*3/uL (ref 1.7–7.7)
Neutrophils Relative %: 59 %
Platelets: 501 10*3/uL — ABNORMAL HIGH (ref 150–400)
RBC: 4.92 MIL/uL (ref 3.87–5.11)
RDW: 13.5 % (ref 11.5–15.5)
WBC: 7.9 10*3/uL (ref 4.0–10.5)
nRBC: 0 % (ref 0.0–0.2)

## 2022-11-05 LAB — BASIC METABOLIC PANEL WITH GFR
Anion gap: 15 (ref 5–15)
BUN: 20 mg/dL (ref 8–23)
CO2: 24 mmol/L (ref 22–32)
Calcium: 9.1 mg/dL (ref 8.9–10.3)
Chloride: 100 mmol/L (ref 98–111)
Creatinine, Ser: 0.98 mg/dL (ref 0.44–1.00)
GFR, Estimated: >60 mL/min (ref >60)
Glucose, Bld: 119 mg/dL — ABNORMAL HIGH (ref 70–99)
Potassium: 3.3 mmol/L — ABNORMAL LOW (ref 3.5–5.1)
Sodium: 139 mmol/L (ref 135–145)

## 2022-11-05 MED ORDER — LOPERAMIDE HCL 2 MG PO CAPS: 2.0000 mg | ORAL_CAPSULE | Freq: Every day | ORAL | Status: DC | PRN

## 2022-11-05 MED ORDER — POTASSIUM CHLORIDE CRYS ER 20 MEQ PO TBCR
20.0000 meq | EXTENDED_RELEASE_TABLET | Freq: Two times a day (BID) | ORAL | Status: AC
  Administered 2022-11-05 (×2): 20 meq via ORAL
  Filled 2022-11-05 (×2): qty 1

## 2022-11-05 NOTE — TOC Progression Note (Signed)
Transition of Care [facility]) - Progression Note    Patient Details  Name: [name] [name]: [id number] Date of Birth: 01-27-1951  Transition of Care [facility]) CM/SW Contact  Patrice Paradise, LCSW Phone Number: 11/05/2022, 9:51 AM  Clinical Narrative:     CSW was alerted by MD that pt is medically and if Berkley Harvey was approved. CSW reached out to [facility] [place] [facility] and had to start an authorization due to one not pending. Reference # [id number]. CSW faxed clinicals and alerted MD that it was pending.   TOC team will continue to assist with discharge planning needs.    Expected Discharge Plan: Skilled Nursing Facility Barriers to Discharge: Continued Medical Work up  Expected Discharge Plan and Services     Post Acute Care Choice: Skilled Nursing Facility Living arrangements for the past 2 months: Apartment                                       Social Determinants of Health (SDOH) Interventions SDOH Screenings   Food Insecurity: No Food Insecurity (11/03/2022)  Housing: Low Risk  (11/03/2022)  Transportation Needs: No Transportation Needs (11/03/2022)  Utilities: Not At Risk (11/03/2022)  Alcohol Screen: Low Risk  (12/02/2021)  Depression (PHQ2-9): Medium Risk (10/25/2022)  Financial Resource Strain: Medium Risk (12/02/2021)  Physical Activity: Inactive (12/02/2021)  Social Connections: Moderately Integrated (03/15/2022)  Stress: No Stress Concern Present (12/02/2021)  Tobacco Use: Medium Risk (10/28/2022)    Readmission Risk Interventions     No data to display

## 2022-11-05 NOTE — Plan of Care (Signed)
  Problem: Education: Goal: Ability to describe self-care measures that may prevent or decrease complications (Diabetes Survival Skills Education) will improve Outcome: Progressing Goal: Individualized Educational Video(s) Outcome: Progressing   Problem: Coping: Goal: Ability to adjust to condition or change in health will improve Outcome: Progressing   Problem: Fluid Volume: Goal: Ability to maintain a balanced intake and output will improve Outcome: Progressing   Problem: Health Behavior/Discharge Planning: Goal: Ability to identify and utilize available resources and services will improve Outcome: Progressing Goal: Ability to manage health-related needs will improve Outcome: Progressing   Problem: Metabolic: Goal: Ability to maintain appropriate glucose levels will improve Outcome: Progressing   Problem: Nutritional: Goal: Maintenance of adequate nutrition will improve Outcome: Progressing Goal: Progress toward achieving an optimal weight will improve Outcome: Progressing   Problem: Skin Integrity: Goal: Risk for impaired skin integrity will decrease Outcome: Progressing   Problem: Tissue Perfusion: Goal: Adequacy of tissue perfusion will improve Outcome: Progressing   Problem: Education: Goal: Knowledge of General Education information will improve Description: Including pain rating scale, medication(s)/side effects and non-pharmacologic comfort measures Outcome: Progressing   Problem: Health Behavior/Discharge Planning: Goal: Ability to manage health-related needs will improve Outcome: Progressing   Problem: Clinical Measurements: Goal: Ability to maintain clinical measurements within normal limits will improve Outcome: Progressing Goal: Will remain free from infection Outcome: Progressing Goal: Diagnostic test results will improve Outcome: Progressing Goal: Respiratory complications will improve Outcome: Progressing Goal: Cardiovascular complication will  be avoided Outcome: Progressing   Problem: Activity: Goal: Risk for activity intolerance will decrease Outcome: Progressing   Problem: Coping: Goal: Level of anxiety will decrease Outcome: Progressing   Problem: Elimination: Goal: Will not experience complications related to bowel motility Outcome: Progressing   Problem: Safety: Goal: Ability to remain free from injury will improve Outcome: Progressing   Problem: Skin Integrity: Goal: Risk for impaired skin integrity will decrease Outcome: Progressing   Problem: Education: Goal: Understanding of CV disease, CV risk reduction, and recovery process will improve Outcome: Progressing Goal: Individualized Educational Video(s) Outcome: Progressing   Problem: Activity: Goal: Ability to return to baseline activity level will improve Outcome: Progressing   Problem: Cardiovascular: Goal: Ability to achieve and maintain adequate cardiovascular perfusion will improve Outcome: Progressing Goal: Vascular access site(s) Level 0-1 will be maintained Outcome: Progressing   Problem: Health Behavior/Discharge Planning: Goal: Ability to safely manage health-related needs after discharge will improve Outcome: Progressing

## 2022-11-05 NOTE — Progress Notes (Addendum)
HD#8 Subjective:  Overnight Events: No acute events.  Pt seen bedside this AM. She states she is doing well, and was wondering about her insurance approval for SNF. She endorses wanting to get better and getting up and moving.   Objective:  Vital signs in last 24 hours: Vitals:   11/04/22 1615 11/04/22 2146 11/05/22 0009 11/05/22 0413  BP: 116/67 (!) 107/90 122/76 127/84  Pulse:  87 71 90  Resp: 20 19 (!) 25 (!) 22  Temp: 98.3 F (36.8 C) 98.2 F (36.8 C) 98.4 F (36.9 C) 98.2 F (36.8 C)  TempSrc: Oral Oral Axillary Axillary  SpO2: 99% 98% 98% 94%  Weight:      Height:       Supplemental O2: Nasal Cannula SpO2: 94 % O2 Flow Rate (L/min): 2 L/min FiO2 (%): 32 %   Physical Exam:  Constitutional: well-appearing, in no acute distress Cardiovascular: regular rate and rhythm, no m/r/g Pulmonary/Chest: Lungs are clear to auscultation bilaterally.  No wheezes.   abdominal: soft, non-tender, non-distended MSK: unna boots in place, wrinkling of skin present on feet Neurological: alert and oriented x 3 Skin: warm and dry  Filed Weights   11/02/22 0446 11/03/22 0443 11/04/22 0504  Weight: (!) 155.8 kg (!) 155.8 kg (!) 151 kg     Intake/Output Summary (Last 24 hours) at 11/05/2022 0655 Last data filed at 11/05/2022 0012 Gross per 24 hour  Intake 480 ml  Output 2000 ml  Net -1520 ml   Net IO Since Admission: -14,522.71 mL [11/05/22 0655]  Pertinent Labs: Platelets at 496  Platelets 3.3.  Imaging: No results found.  Assessment/Plan:   Principal Problem:   Acute on chronic HFrEF (heart failure with reduced ejection fraction) (HCC) Active Problems:   COPD (chronic obstructive pulmonary disease) (HCC)   Atrial fibrillation with controlled ventricular response (HCC)   Acute respiratory failure with hypoxia (HCC)   Acute pulmonary edema (HCC)   Cardiac arrest (HCC)   Acute cystitis without hematuria   Patient Summary: Sharon Vasquez is a 72 y.o. with a  pertinent PMH of HFpEF, Afib, COPD, morbid obesity, DM, hx of DVT/ PE, CVA, OSA, who presented with hypoxia leading to intubation admitted on 07/27 for acute on chronic hypoxic and hypercapnic respiratory failure on HD#8. Hospital course completed by newly found reduced cardiac ejection fraction. She also had a cardiac arrest due to mucus plugging.   Newly reduced HFrEF, EF 20% Started on GDMT two days ago, and blood pressures have been stable. Echo(07/27)showed EF <20% with regional wall motion abnormalities. She is s[facility]p [facility] completed which showed mild nonobstructive CAD with normal filling pressure, mild pulmonary hypertension, and normal RA pressure. Worsening heart failure likely in setting of stress cardiomyopathy from respiratory decompensation. We are holding off on SGLT2 given patient most recent history of UTI. She is stable, pending SNF.  - Continue spironolactone 12.5 mg - Continue metoprolol 12.5 mg - Continue losartan 12.5 - Keep K >4, Mg>2. - Follow-up echo in 6 weeks( around 08/13 or later)  Hypokalemia - Related to diuresis - replaced orally today - Trend daily  Acute on chronic hypoxic and hypercarbic respiratory failure OSA Improving, currently on 3Lnc satting >88%. Lungs are clear bilaterally on physical exam.  Patient was intubated on 7/27, she also had a cardiac arrest due to respiratory arrest from mucus plug on 07/29. Extubated on 7/30, otherwise doing well.  The likely etiology is multifactorial in the setting of pneumonia and HFrEF exacerbation. Completed antibiotic  for CAP.  - flutter valve - Incentive spirometer - LAMA, LABA, ICS inhaler - keep O2 saturation >88% - CPAP qhs  Afib Stable. Currently rate controlled. She was on amidarone for brief period this admission but this was discontiued due to sinus pauses. -continue telemetry -metoprolol and xarelto  AKI-resolved Stable.  Creatinine 1.1.  Simple cystitis, resolved. Stable. Complete antibiotic  course.  Diabetes Last A1c well controlled at 5.8. home medications include semaglutide. -ssi   Diet: Heart Healthy VTE:  DOAC Code: Full PT/OT recs: SNF for Subacute PT. TOC recs: SNF, FL2 sent today Family Update: Sharon Vasquez updated by phone   Dispo: Anticipated discharge to Rehab in 2 days pending insurance authorization.   Olegario Messier, MD Internal Medicine Resident, PGY-2 [facility]  Pager: [phone] 6:55 AM, 11/05/2022   **Please contact the on call pager after 5 pm and on weekends at [phone].**  6:55 AM, 11/05/2022

## 2022-11-05 NOTE — Progress Notes (Signed)
Rounding Note    Patient Name: Sharon Vasquez Date of Encounter: 11/05/2022  [facility] Cardiologist: Nanetta Batty, MD   Subjective   No complaints, on bedpan.  Inpatient Medications    Scheduled Meds:  aspirin EC  81 mg Oral Daily   atorvastatin  80 mg Oral Daily   Chlorhexidine Gluconate Cloth  6 each Topical Q0600   feeding supplement  237 mL Oral BID BM   fluticasone furoate-vilanterol  1 puff Inhalation Daily   And   umeclidinium bromide  1 puff Inhalation Daily   guaiFENesin  10 mL Oral BID   insulin aspart  0-9 Units Subcutaneous TID WC   letrozole  2.5 mg Oral Daily   losartan  25 mg Oral Daily   metoprolol succinate  12.5 mg Oral Daily   multivitamin with minerals  1 tablet Oral Daily   mouth rinse  15 mL Mouth Rinse 4 times per day   oxyCODONE  5 mg Oral Q6H   pantoprazole  40 mg Oral Daily   rivaroxaban  20 mg Oral Daily   sodium chloride flush  3 mL Intravenous Q12H   spironolactone  12.5 mg Oral Daily   torsemide  40 mg Oral Daily   Continuous Infusions:  sodium chloride 10 mL/hr at 11/03/22 0400   PRN Meds: sodium chloride, acetaminophen, albuterol, mouth rinse, mouth rinse, phenol, polyethylene glycol, sodium chloride flush   Vital Signs    Vitals:   11/05/22 0413 11/05/22 0500 11/05/22 0726 11/05/22 0810  BP: 127/84  (!) 122/93   Pulse: 90  95 97  Resp: (!) 22  20   Temp: 98.2 F (36.8 C)  97.8 F (36.6 C)   TempSrc: Axillary  Oral   SpO2: 94%  99% 98%  Weight:  (!) 150.8 kg    Height:        Intake/Output Summary (Last 24 hours) at 11/05/2022 0930 Last data filed at 11/05/2022 0654 Gross per 24 hour  Intake 240 ml  Output 2150 ml  Net -1910 ml      11/05/2022    5:00 AM 11/04/2022    5:04 AM 11/03/2022    4:43 AM  Last 3 Weights  Weight (lbs) 332 lb 7.3 oz 332 lb 14.3 oz 343 lb 7.6 oz  Weight (kg) 150.8 kg 151 kg 155.8 kg      Telemetry    Afib controlled ventricular response- Personally Reviewed  ECG    No new -  Personally Reviewed  Physical Exam   GEN: No acute distress.   Neck: JVD not well appreciated Cardiac: iRRR, no murmurs, rubs, or gallops.  Respiratory: crackles laterally GI: Soft, nontender, non-distended  MS: mild edema, legs wrapped; No deformity. Neuro:  Nonfocal  Psych: Normal affect   Labs    High Sensitivity Troponin:   Recent Labs  Lab 10/28/22 2210 10/29/22 0020 10/30/22 1230 10/30/22 1403  TROPONINIHS 20* 140* 100* 105*     Chemistry Recent Labs  Lab 10/31/22 0428 11/01/22 0249 11/01/22 1246 11/02/22 0301 11/02/22 1215 11/02/22 1522 11/03/22 0118 11/04/22 0121 11/05/22 0143  NA 140 142   < > 137   < >  --  140 137 139  K 4.0 3.1*   < > 2.9*   < >  --  3.9 3.3* 3.3*  CL 105 100  --  98   < >  --  100 100 100  CO2 25 30  --  28   < >  --  28 27 24   GLUCOSE 133* 94  --  125*   < >  --  112* 112* 119*  BUN 24* 16  --  15   < >  --  14 17 20   CREATININE 1.08* 1.10*  --  1.12*   < >  --  0.97 1.05* 0.98  CALCIUM 8.6* 8.9  --  9.2   < >  --  9.3 9.0 9.1  MG 2.1 1.7  --  1.8  --  2.0 2.0  --   --   PROT 5.7* 6.9  --  7.2  --   --   --   --   --   ALBUMIN 2.9* 3.7  --  3.8  --   --   --   --   --   AST 16 16  --  28  --   --   --   --   --   ALT 21 19  --  26  --   --   --   --   --   ALKPHOS 70 74  --  78  --   --   --   --   --   BILITOT 0.6 1.5*  --  0.9  --   --   --   --   --   GFRNONAA 54* 53*  --  52*   < >  --  >60 56* >60  ANIONGAP 10 12  --  11   < >  --  12 10 15    < > = values in this interval not displayed.    Lipids  No results for input(s): "CHOL", "TRIG", "HDL", "LABVLDL", "LDLCALC", "CHOLHDL" in the last 168 hours.   Hematology Recent Labs  Lab 11/03/22 0118 11/03/22 2031 11/05/22 0143  WBC 6.1 7.0 7.9  RBC 4.64 4.90 4.92  HGB 12.9 13.8 13.5  HCT 39.3 41.7 41.0  MCV 84.7 85.1 83.3  MCH 27.8 28.2 27.4  MCHC 32.8 33.1 32.9  RDW 13.7 13.7 13.5  PLT 496* 505* 501*   Thyroid No results for input(s): "TSH", "FREET4" in the last  168 hours.  BNP No results for input(s): "BNP", "PROBNP" in the last 168 hours.   DDimer No results for input(s): "DDIMER" in the last 168 hours.   Radiology    No results found.  Cardiac Studies   Echo with severely reduced EF  Patient Profile     72 y.o. female with a hx of HFpEF, Afib, COPD on home O2, resp failure, DM, HTN, HLD, DVT 2018, OSA, CVA, admitted with respiratory failure found to have reduced EF.   Assessment & Plan    Principal Problem:   Acute on chronic HFrEF (heart failure with reduced ejection fraction) (HCC) Active Problems:   COPD (chronic obstructive pulmonary disease) (HCC)   Atrial fibrillation with controlled ventricular response (HCC)   Acute respiratory failure with hypoxia (HCC)   Acute pulmonary edema (HCC)   Cardiac arrest (HCC)   Acute cystitis without hematuria  Nonobstructive CAD and normalized intracardiac pressures suggest nature of CM is NICM. Likely stress cardiomyopathy.  - obtain repeat echocardiogram in 6 week as outpatient.    Heart Failure Therapy ACE-I/ARB/ARNI: losartan 25 mg daily BB: metop 12.5 mg daily, consider uptitration to home dose. MRA:  spironolactone 12.5 mg daily, consider uptitration if BP allows for assistance with k supplementation. SGLT2I: consider as outpatient.  Diuretic plan: torsemide 40 mg po daily.   For  questions or updates, please contact [facility] Please consult [email or web address] for contact info under        Signed, Parke Poisson, MD  11/05/2022, 9:30 AM

## 2022-11-06 ENCOUNTER — Other Ambulatory Visit (HOSPITAL_COMMUNITY): Payer: Self-pay

## 2022-11-06 DIAGNOSIS — I428 Other cardiomyopathies: Secondary | ICD-10-CM | POA: Diagnosis not present

## 2022-11-06 DIAGNOSIS — I4891 Unspecified atrial fibrillation: Secondary | ICD-10-CM | POA: Diagnosis not present

## 2022-11-06 DIAGNOSIS — M6281 Muscle weakness (generalized): Secondary | ICD-10-CM | POA: Diagnosis not present

## 2022-11-06 DIAGNOSIS — R1311 Dysphagia, oral phase: Secondary | ICD-10-CM | POA: Diagnosis not present

## 2022-11-06 DIAGNOSIS — I469 Cardiac arrest, cause unspecified: Secondary | ICD-10-CM | POA: Diagnosis not present

## 2022-11-06 DIAGNOSIS — Z5986 Financial insecurity: Secondary | ICD-10-CM | POA: Diagnosis not present

## 2022-11-06 DIAGNOSIS — Z7901 Long term (current) use of anticoagulants: Secondary | ICD-10-CM | POA: Diagnosis not present

## 2022-11-06 DIAGNOSIS — Z741 Need for assistance with personal care: Secondary | ICD-10-CM | POA: Diagnosis not present

## 2022-11-06 DIAGNOSIS — J449 Chronic obstructive pulmonary disease, unspecified: Secondary | ICD-10-CM | POA: Diagnosis not present

## 2022-11-06 DIAGNOSIS — M6259 Muscle wasting and atrophy, not elsewhere classified, multiple sites: Secondary | ICD-10-CM | POA: Diagnosis not present

## 2022-11-06 DIAGNOSIS — G4733 Obstructive sleep apnea (adult) (pediatric): Secondary | ICD-10-CM | POA: Diagnosis not present

## 2022-11-06 DIAGNOSIS — J9601 Acute respiratory failure with hypoxia: Secondary | ICD-10-CM | POA: Diagnosis not present

## 2022-11-06 DIAGNOSIS — M170 Bilateral primary osteoarthritis of knee: Secondary | ICD-10-CM | POA: Diagnosis not present

## 2022-11-06 DIAGNOSIS — Z7984 Long term (current) use of oral hypoglycemic drugs: Secondary | ICD-10-CM | POA: Diagnosis not present

## 2022-11-06 DIAGNOSIS — I5023 Acute on chronic systolic (congestive) heart failure: Secondary | ICD-10-CM | POA: Diagnosis not present

## 2022-11-06 DIAGNOSIS — I110 Hypertensive heart disease with heart failure: Secondary | ICD-10-CM | POA: Diagnosis not present

## 2022-11-06 DIAGNOSIS — J9690 Respiratory failure, unspecified, unspecified whether with hypoxia or hypercapnia: Secondary | ICD-10-CM | POA: Diagnosis not present

## 2022-11-06 DIAGNOSIS — E119 Type 2 diabetes mellitus without complications: Secondary | ICD-10-CM | POA: Diagnosis not present

## 2022-11-06 DIAGNOSIS — Z9981 Dependence on supplemental oxygen: Secondary | ICD-10-CM | POA: Diagnosis not present

## 2022-11-06 DIAGNOSIS — M1990 Unspecified osteoarthritis, unspecified site: Secondary | ICD-10-CM | POA: Diagnosis not present

## 2022-11-06 DIAGNOSIS — Z951 Presence of aortocoronary bypass graft: Secondary | ICD-10-CM | POA: Diagnosis not present

## 2022-11-06 DIAGNOSIS — I5020 Unspecified systolic (congestive) heart failure: Secondary | ICD-10-CM | POA: Diagnosis not present

## 2022-11-06 DIAGNOSIS — Z853 Personal history of malignant neoplasm of breast: Secondary | ICD-10-CM | POA: Diagnosis not present

## 2022-11-06 DIAGNOSIS — Z7401 Bed confinement status: Secondary | ICD-10-CM | POA: Diagnosis not present

## 2022-11-06 DIAGNOSIS — I5022 Chronic systolic (congestive) heart failure: Secondary | ICD-10-CM | POA: Diagnosis not present

## 2022-11-06 DIAGNOSIS — Z6841 Body Mass Index (BMI) 40.0 and over, adult: Secondary | ICD-10-CM | POA: Diagnosis not present

## 2022-11-06 DIAGNOSIS — K219 Gastro-esophageal reflux disease without esophagitis: Secondary | ICD-10-CM | POA: Diagnosis not present

## 2022-11-06 DIAGNOSIS — Z87891 Personal history of nicotine dependence: Secondary | ICD-10-CM

## 2022-11-06 DIAGNOSIS — M792 Neuralgia and neuritis, unspecified: Secondary | ICD-10-CM | POA: Diagnosis not present

## 2022-11-06 DIAGNOSIS — Z7189 Other specified counseling: Secondary | ICD-10-CM | POA: Diagnosis not present

## 2022-11-06 DIAGNOSIS — R2681 Unsteadiness on feet: Secondary | ICD-10-CM | POA: Insufficient documentation

## 2022-11-06 DIAGNOSIS — Z8639 Personal history of other endocrine, nutritional and metabolic disease: Secondary | ICD-10-CM | POA: Diagnosis not present

## 2022-11-06 DIAGNOSIS — E1140 Type 2 diabetes mellitus with diabetic neuropathy, unspecified: Secondary | ICD-10-CM | POA: Diagnosis not present

## 2022-11-06 DIAGNOSIS — Z79899 Other long term (current) drug therapy: Secondary | ICD-10-CM | POA: Diagnosis not present

## 2022-11-06 DIAGNOSIS — J9611 Chronic respiratory failure with hypoxia: Secondary | ICD-10-CM | POA: Diagnosis not present

## 2022-11-06 DIAGNOSIS — I5032 Chronic diastolic (congestive) heart failure: Secondary | ICD-10-CM | POA: Diagnosis not present

## 2022-11-06 DIAGNOSIS — Z743 Need for continuous supervision: Secondary | ICD-10-CM | POA: Diagnosis not present

## 2022-11-06 DIAGNOSIS — I2510 Atherosclerotic heart disease of native coronary artery without angina pectoris: Secondary | ICD-10-CM | POA: Diagnosis not present

## 2022-11-06 LAB — GLUCOSE, CAPILLARY
Glucose-Capillary: 124 mg/dL — ABNORMAL HIGH (ref 70–99)
Glucose-Capillary: 107 mg/dL — ABNORMAL HIGH (ref 70–99)
Glucose-Capillary: 102 mg/dL — ABNORMAL HIGH (ref 70–99)

## 2022-11-06 MED ORDER — TRELEGY ELLIPTA 100-62.5-25 MCG/ACT IN AEPB: 1.0000 | INHALATION_SPRAY | Freq: Every day | RESPIRATORY_TRACT | 5 refills | Status: DC

## 2022-11-06 MED ORDER — SPIRONOLACTONE 25 MG PO TABS: 25.0000 mg | ORAL_TABLET | Freq: Every day | ORAL | Status: DC

## 2022-11-06 MED ORDER — OXYCODONE HCL 5 MG PO TABS
15.0000 mg | ORAL_TABLET | Freq: Four times a day (QID) | ORAL | Status: DC | PRN
  Administered 2022-11-06 (×2): 15 mg via ORAL
  Filled 2022-11-06 (×2): qty 3

## 2022-11-06 MED ORDER — SPIRONOLACTONE 25 MG PO TABS
25.0000 mg | ORAL_TABLET | Freq: Every day | ORAL | Status: DC
  Administered 2022-11-06: 25 mg via ORAL
  Filled 2022-11-06: qty 1

## 2022-11-06 MED ORDER — METOPROLOL SUCCINATE ER 25 MG PO TB24
25.0000 mg | ORAL_TABLET | Freq: Every day | ORAL | Status: DC
  Administered 2022-11-06: 25 mg via ORAL
  Filled 2022-11-06: qty 1

## 2022-11-06 MED ORDER — TORSEMIDE 40 MG PO TABS: 40.0000 mg | ORAL_TABLET | Freq: Every day | ORAL | Status: DC

## 2022-11-06 MED ORDER — WEGOVY 1 MG/0.5ML ~~LOC~~ SOAJ
1.0000 mg | SUBCUTANEOUS | 3 refills | Status: DC
  Filled 2022-11-06: qty 2, fill #0

## 2022-11-06 MED ORDER — TRELEGY ELLIPTA 100-62.5-25 MCG/ACT IN AEPB
1.0000 | INHALATION_SPRAY | Freq: Every day | RESPIRATORY_TRACT | 5 refills | Status: DC
  Filled 2022-11-06: qty 60, 30d supply, fill #0

## 2022-11-06 NOTE — Hospital Course (Addendum)
72 year old woman with a history of diastolic heart failure, COPD on home oxygen, prior VTE presenting with rather sudden onset of shortness of breath after a car trip today. She arrived to the ER in extremis and was intubated for severe hypoxemia.    #Acute on chronic hypoxic and hypercarbic respiratory failure OSA Presented to the ED on 7/27 with shortness of breath after a car trip. She was intubated in the ED for severe hypoxemia. Started on pressors for hypotension. CXR with cardiomegaly and pulmonary edema. ECHO 7/28 showed LVEF <20% (was 60-65% 04/2022), global hypokinesis, mild LVH, RV normal. 7/29 developed mucus plug in ET tube, became hypoxic and unresponsive and HR dropped to 30s. She required brief CPR for inadequate heart rate. Mucus plug removed and extubated 7/30.  Patient is now back to 2 to 3 L of home oxygen requirement.  Will continue home LAMA, LABA and ICS inhalers.  #Newly reduced HFrEF, EF 20% Echo(07/27)showed EF <20% with regional wall motion abnormalities. She is s/p [facility] completed which showed mild nonobstructive CAD with normal filling pressure, mild pulmonary hypertension, and normal RA pressure. Worsening heart failure likely in setting of stress cardiomyopathy from respiratory decompensation.   We are stopping home ASA given Stress cardiomyopathy variant not LAD infarct.  She was started on GDMT and her blood pressures have been stable.  She was diuresed with 60 mg of IV torsemide, and transition to oral torsemide 60 mg without any issues.  -Continue metoprolol 25 mg -Continue on spironolactone 25 mg. -Continue Losartan 25 mg. -Continue home torsemide 60 mg - Follow-up echo in 6 weeks( around 08/13 or later) - Consider adding SGLT 2 inhibitors outpatient.  #Afib Stable. Currently rate controlled.  Amiodarone held due to sinus pauses. We continued metoprolol and Xarelto.   # AKI-resolved Peak creatinine of 1.4. Thought to be from cardiorenal syndrome. Creaitinine at  1.1 today.    Simple cystitis, resolved. Cultures grew E. coli.  He was treated with 3 days of Zosyn.   Diabetes Last A1c well controlled at 5.8. home medications include semaglutide. -ssi

## 2022-11-06 NOTE — Plan of Care (Signed)
  Problem: Education: Goal: Ability to describe self-care measures that may prevent or decrease complications (Diabetes Survival Skills Education) will improve Outcome: Progressing Goal: Individualized Educational Video(s) Outcome: Progressing   Problem: Coping: Goal: Ability to adjust to condition or change in health will improve Outcome: Progressing   Problem: Fluid Volume: Goal: Ability to maintain a balanced intake and output will improve Outcome: Progressing   Problem: Health Behavior/Discharge Planning: Goal: Ability to identify and utilize available resources and services will improve Outcome: Progressing Goal: Ability to manage health-related needs will improve Outcome: Progressing   Problem: Metabolic: Goal: Ability to maintain appropriate glucose levels will improve Outcome: Progressing   Problem: Nutritional: Goal: Maintenance of adequate nutrition will improve Outcome: Progressing Goal: Progress toward achieving an optimal weight will improve Outcome: Progressing   Problem: Skin Integrity: Goal: Risk for impaired skin integrity will decrease Outcome: Progressing   Problem: Tissue Perfusion: Goal: Adequacy of tissue perfusion will improve Outcome: Progressing   Problem: Education: Goal: Knowledge of General Education information will improve Description: Including pain rating scale, medication(s)/side effects and non-pharmacologic comfort measures Outcome: Progressing   Problem: Health Behavior/Discharge Planning: Goal: Ability to manage health-related needs will improve Outcome: Progressing   Problem: Clinical Measurements: Goal: Ability to maintain clinical measurements within normal limits will improve Outcome: Progressing Goal: Will remain free from infection Outcome: Progressing Goal: Diagnostic test results will improve Outcome: Progressing Goal: Respiratory complications will improve Outcome: Progressing Goal: Cardiovascular complication will  be avoided Outcome: Progressing   Problem: Activity: Goal: Risk for activity intolerance will decrease Outcome: Progressing   Problem: Coping: Goal: Level of anxiety will decrease Outcome: Progressing   Problem: Elimination: Goal: Will not experience complications related to bowel motility Outcome: Progressing   Problem: Safety: Goal: Ability to remain free from injury will improve Outcome: Progressing   Problem: Skin Integrity: Goal: Risk for impaired skin integrity will decrease Outcome: Progressing   Problem: Education: Goal: Understanding of CV disease, CV risk reduction, and recovery process will improve Outcome: Progressing Goal: Individualized Educational Video(s) Outcome: Progressing   Problem: Activity: Goal: Ability to return to baseline activity level will improve Outcome: Progressing   Problem: Cardiovascular: Goal: Ability to achieve and maintain adequate cardiovascular perfusion will improve Outcome: Progressing Goal: Vascular access site(s) Level 0-1 will be maintained Outcome: Progressing   Problem: Health Behavior/Discharge Planning: Goal: Ability to safely manage health-related needs after discharge will improve Outcome: Progressing

## 2022-11-06 NOTE — Progress Notes (Signed)
   11/06/22 0128  BiPAP/CPAP/SIPAP  Reason BIPAP/CPAP not in use Non-compliant (pt refused cpap)

## 2022-11-06 NOTE — Discharge Summary (Addendum)
Name: Sharon Vasquez [name]: [id number] DOB: 1951/01/22 72 y.o. PCP: Sharon Mann, DO  Date of Admission: 10/28/2022  9:45 PM Date of Discharge: 11/06/22 Attending Physician: Sharon Rung, DO  Discharge Diagnosis: 1. Principal Problem:   Acute on chronic HFrEF (heart failure with reduced ejection fraction) (HCC) Active Problems:   COPD (chronic obstructive pulmonary disease) (HCC)   Atrial fibrillation with controlled ventricular response (HCC)   Acute respiratory failure with hypoxia (HCC)   Acute pulmonary edema (HCC)   Cardiac arrest (HCC)   Acute cystitis without hematuria   Discharge Medications: Allergies as of 11/06/2022   No Known Allergies      Medication List     STOP taking these medications    alendronate 70 MG tablet Commonly known as: Fosamax   Myrbetriq 50 MG Tb24 tablet Generic drug: mirabegron ER       TAKE these medications    albuterol 108 (90 Base) MCG/ACT inhaler Commonly known as: VENTOLIN HFA Inhale 2 puffs into the lungs every 4 (four) hours as needed for shortness of breath.   aspirin EC 81 MG tablet Take 81 mg by mouth daily.   atorvastatin 80 MG tablet Commonly known as: LIPITOR Take 1 tablet (80 mg total) by mouth daily.   esomeprazole 40 MG capsule Commonly known as: NEXIUM Take 1 capsule (40 mg total) by mouth daily as needed (for heartburn or indigestion). What changed: when to take this   letrozole 2.5 MG tablet Commonly known as: FEMARA TAKE 1 TABLET(2.5 MG) BY MOUTH DAILY   LORazepam 0.5 MG tablet Commonly known as: ATIVAN Take 0.5 mg by mouth daily as needed for anxiety.   losartan 25 MG tablet Commonly known as: Cozaar Take 1 tablet (25 mg total) by mouth daily.   metoprolol succinate 25 MG 24 hr tablet Commonly known as: TOPROL-XL TAKE 1 TABLET BY MOUTH DAILY   naloxone 4 MG/0.1ML Liqd nasal spray kit Commonly known as: NARCAN Place 1 spray into the nose once as needed (for overdose suspected). for  opioid overdose   oxybutynin 5 MG tablet Commonly known as: DITROPAN Take 1 tablet (5 mg total) by mouth 3 (three) times daily.   oxyCODONE 15 MG immediate release tablet Commonly known as: ROXICODONE Take 15 mg by mouth See admin instructions. Take 15mg  (1 tablet) by mouth every morning and 15mg  (1 tablet) at bedtime in addition to twice during the day as needed.   OXYGEN Inhale 2-3 L into the lungs daily.   potassium chloride SA 20 MEQ tablet Commonly known as: KLOR-CON M Take 1 tablet (20 mEq total) by mouth daily.   PRESCRIPTION MEDICATION Inhale into the lungs at bedtime. cpap   rivaroxaban 20 MG Tabs tablet Commonly known as: Xarelto Take 1 tablet (20 mg total) by mouth every morning.   spironolactone 25 MG tablet Commonly known as: ALDACTONE Take 1 tablet (25 mg total) by mouth daily. Start taking on: November 07, 2022   Torsemide 40 MG Tabs Take 40 mg by mouth daily. Start taking on: November 07, 2022 What changed:  medication strength how much to take how to take this when to take this additional instructions   Trelegy Ellipta 100-62.5-25 MCG/ACT Aepb Generic drug: Fluticasone-Umeclidin-Vilant Inhale 1 puff into the lungs daily.   Wegovy 1 MG/0.5ML Soaj Generic drug: Semaglutide-Weight Management Inject 1 mg into the skin once a week.        Disposition and follow-up:   Sharon Vasquez was discharged from [facility] in Good condition.  At the hospital follow up visit please address:  1.    A/C Hypoxic Respiratory Failure - please ensure pt has enough of her inhaler (Trelegy)  A/C HF - Pt was restarted on her GDMT management, please ensure compliance. Please also check BMP to evaluate for potassium level. Ensure that her blood pressure has been within normal limits. Consider initiation of SGLT2, consider increasing torsemide dose of 40. Consider change from Metoprolol to Bisoprolol per HF recommendations.  Morbid Obesity with Non  Obstructive CAD: Ozempic denied by insurance, on wygovy. Sent a new prescription while inpatient as her SNF does not carry it. Please ensure she is adherent.   2.  Labs / imaging needed at time of follow-up: BMP  3.  Pending labs/ test needing follow-up: Echo around 8/15.   Follow-up Appointments:  Contact information for follow-up providers     Sharon Vasquez, [place], MD Follow up on 11/16/2022.   Specialty: Internal Medicine Why: You have an appointment with Dr. Hassan Vasquez on 11/16/22 at [facility] information: [address] [place] [address] [phone]              Contact information for after-discharge care     Destination     [facility] Preferred SNF .   Service: Skilled Nursing Contact information: [address] [place] [address] [phone]                     Hospital Course by problem list: #Acute on chronic hypoxic and hypercarbic respiratory failure OSA Presented to the ED on 7/27 with shortness of breath after a car trip. She was intubated in the ED for severe hypoxemia. Started on pressors for hypotension. CXR with cardiomegaly and pulmonary edema. ECHO 7/28 showed LVEF <20% (was 60-65% 04/2022), global hypokinesis, mild LVH, RV normal. 7/29 developed mucus plug in ET tube, became hypoxic and unresponsive and HR dropped to 30s. She required brief CPR for inadequate heart rate. Mucus plug removed and extubated 7/30.  Patient is now back to 2 to 3 L of home oxygen requirement, and will continue inhalers.   #Newly reduced HFrEF, EF 20% Echo(07/27)showed EF <20% with regional wall motion abnormalities. She is s/p [facility] completed which showed mild nonobstructive CAD with normal filling pressure, mild pulmonary hypertension, and normal RA pressure. Worsening heart failure likely in setting of stress cardiomyopathy from respiratory decompensation.   We are stopping home ASA given Stress  cardiomyopathy variant not LAD infarct.  She was started on GDMT and her blood pressures have been stable.  She was diuresed with 60 mg of IV torsemide, and transition to oral torsemide 40 mg without any issues. Will continue her GDMT management of metoprolol 25mg , spironolactone 25mg , losartan 25mg . She will need a follow up echo around 8/13.   #Afib Stable. Currently rate controlled.  Amiodarone held due to sinus pauses. We continued metoprolol and Xarelto.   # AKI-resolved Peak creatinine of 1.4. Thought to be from cardiorenal syndrome. Creaitinine at 1.1 today.    Simple cystitis, resolved. Cultures grew E. coli.  He was treated with 3 days of Zosyn.   Prediabetes Morbid obesity with Mild Non obstructive CAD Last A1c well controlled at 5.8. home medications include semaglutide, controlled with SSI while inpatient.   Discharge Exam:   BP 96/61 (BP Location: Left Wrist)   Pulse 88   Temp 98.1 F (36.7 C) (Oral)   Resp 20   Ht 6\' 2"  (  1.88 m)   Wt (!) 150.8 kg   SpO2 98%   BMI 42.68 kg/m  Discharge exam:   Constitutional: well-appearing, in no acute distress Cardiovascular: regular rate and rhythm, no m/r/g Pulmonary/Chest: Lungs are clear to auscultation bilaterally.  No wheezes.   abdominal: soft, non-tender, non-distended MSK: unna boots in place, wrinkling of skin present on feet Neurological: alert and oriented x 3 Skin: warm and dry  Pertinent Labs, Studies, and Procedures:     Latest Ref Rng & Units 11/06/2022    7:00 AM 11/05/2022    1:43 AM 11/03/2022    8:31 PM  CBC  WBC 4.0 - 10.5 K/uL 7.6  7.9  7.0   Hemoglobin 12.0 - 15.0 g/dL 54.0  98.1  19.1   Hematocrit 36.0 - 46.0 % 40.1  41.0  41.7   Platelets 150 - 400 K/uL 483  501  505        Latest Ref Rng & Units 11/06/2022    7:00 AM 11/05/2022    1:43 AM 11/04/2022    1:21 AM  BMP  Glucose 70 - 99 mg/dL 478  295  621   BUN 8 - 23 mg/dL 22  20  17    Creatinine 0.44 - 1.00 mg/dL 3.08  6.57  8.46   Sodium 135 - 145  mmol/L 137  139  137   Potassium 3.5 - 5.1 mmol/L 3.6  3.3  3.3   Chloride 98 - 111 mmol/L 101  100  100   CO2 22 - 32 mmol/L 26  24  27    Calcium 8.9 - 10.3 mg/dL 9.2  9.1  9.0     Discharge Instructions: Discharge Instructions     Call MD for:  difficulty breathing, headache or visual disturbances   Complete by: As directed    Diet - low sodium heart healthy   Complete by: As directed    Increase activity slowly   Complete by: As directed    No wound care   Complete by: As directed        Signed: Olegario Messier, MD 11/06/2022, 3:15 PM   Pager: [phone]

## 2022-11-06 NOTE — TOC Progression Note (Signed)
Transition of Care [facility]) - Progression Note    Patient Details  Name: Sharon Vasquez [name]: [id number] Date of Birth: 1950/08/05  [facility] CM/SW Contact  Leander Rams, LCSW Phone Number: 11/06/2022, 3:08 PM  Clinical Narrative:    CSW received a call from [place] stating pt has been approved to go to [facility]. Auth reference number is [id number]. Berkley Harvey is approved for 8/4-8/7. CSW made MD aware. Plan will be for pt to dc to [facility] today.   TOC will continue to follow.    Expected Discharge Plan: Skilled Nursing Facility Barriers to Discharge: Continued Medical Work up  Expected Discharge Plan and Services     Post Acute Care Choice: Skilled Nursing Facility Living arrangements for the past 2 months: Apartment                                       Social Determinants of Health (SDOH) Interventions SDOH Screenings   Food Insecurity: No Food Insecurity (11/03/2022)  Housing: Low Risk  (11/03/2022)  Transportation Needs: No Transportation Needs (11/03/2022)  Utilities: Not At Risk (11/03/2022)  Alcohol Screen: Low Risk  (12/02/2021)  Depression (PHQ2-9): Medium Risk (10/25/2022)  Financial Resource Strain: Medium Risk (12/02/2021)  Physical Activity: Inactive (12/02/2021)  Social Connections: Moderately Integrated (03/15/2022)  Stress: No Stress Concern Present (12/02/2021)  Tobacco Use: Medium Risk (10/28/2022)    Readmission Risk Interventions     No data to display         Oletta Lamas, MSW, LCSWA, LCASA Transitions of Care  Clinical Social Worker I

## 2022-11-06 NOTE — Progress Notes (Signed)
HD#9 Subjective:  Overnight Events: No acute events.  Pt seen bedside this AM.  Insurance authorization still pending, but she needs to work with PT today. Patient reports not having a sound sleep overnight, she feels left leg is more swollen.    Vitals:   11/06/22 0033 11/06/22 0128 11/06/22 0352 11/06/22 0720  BP: 125/75  122/76 132/81  Pulse: 88  82 88  Resp: (!) 22 (!) 24 20 17   Temp: 98.2 F (36.8 C)  98.2 F (36.8 C) 97.8 F (36.6 C)  TempSrc: Oral  Oral Oral  SpO2: 100%  99% 98%  Weight:      Height:       Supplemental O2: Nasal Cannula SpO2: 98 % O2 Flow Rate (L/min): 2 L/min FiO2 (%): 32 %   Physical Exam:  Constitutional: well-appearing, in no acute distress Cardiovascular: regular rate and rhythm, no m/r/g Pulmonary/Chest: Lungs are clear to auscultation bilaterally.  No wheezes.   abdominal: soft, non-tender, non-distended MSK: unna boots in place, wrinkling of skin present on feet Neurological: alert and oriented x 3 Skin: warm and dry  Filed Weights   11/03/22 0443 11/04/22 0504 11/05/22 0500  Weight: (!) 155.8 kg (!) 151 kg (!) 150.8 kg     Intake/Output Summary (Last 24 hours) at 11/06/2022 1155 Last data filed at 11/06/2022 1023 Gross per 24 hour  Intake 477 ml  Output 2700 ml  Net -2223 ml   Net IO Since Admission: -17,595.71 mL [11/06/22 1155]  Pertinent Labs: Platelets at 520  Platelets 3.3.  Imaging: No results found.  Assessment/Plan:   Principal Problem:   Acute on chronic HFrEF (heart failure with reduced ejection fraction) (HCC) Active Problems:   COPD (chronic obstructive pulmonary disease) (HCC)   Atrial fibrillation with controlled ventricular response (HCC)   Acute respiratory failure with hypoxia (HCC)   Acute pulmonary edema (HCC)   Cardiac arrest (HCC)   Acute cystitis without hematuria   Patient Summary: Sharon Vasquez is a 72 y.o. with a pertinent PMH of HFpEF, Afib, COPD, morbid obesity, DM, hx of DVT/ PE,  CVA, OSA, who presented with hypoxia leading to intubation admitted on 07/27 for acute on chronic hypoxic and hypercapnic respiratory failure on HD#9. Hospital course completed by newly found reduced cardiac ejection fraction. She also had a cardiac arrest due to mucus plugging.   #Newly reduced HFrEF, EF 20% Started on GDMT 3 days ago, and blood pressures have been stable. Echo(07/27)showed EF <20% with regional wall motion abnormalities. She is s/p [facility] completed which showed mild nonobstructive CAD with normal filling pressure, mild pulmonary hypertension, and normal RA pressure. Worsening heart failure likely in setting of stress cardiomyopathy.  We are stopping home ASA given Stress cardiomyopathy variant not LAD infarct.  Patient is stable, pending SNF placement.  - Increase spironolactone 12.5 mg to 25 mg. - Increase metoprolol from 12.5 mg to 25 mg. - Increase losartan 12.5 mg to 25 mg.  - Keep K >4, Mg>2. - Follow-up echo in 6 weeks( around 08/13 or later) - Discontinue home aspirin.  #Hypokalemia, stable. - Related to diuresis -Monitor BMP.  #Acute on chronic hypoxic and hypercarbic respiratory failure OSA Stable. She is on 2-3L of oxygen at baseline, satting 98%.  Lungs are clear bilaterally on physical exam.  Patient was intubated on 7/27, she also had a cardiac arrest due to respiratory arrest from mucus plug on 07/29. Extubated on 7/30, otherwise doing well.  The likely etiology is multifactorial in the  setting of pneumonia and HFrEF exacerbation. Completed antibiotic for CAP.  - CPAP qhs - flutter valve - Incentive spirometer - LAMA, LABA, ICS inhaler - keep O2 saturation >88%   #Afib Stable. Currently rate controlled.  Amiodarone held due to sinus pauses. -continue telemetry -metoprolol and xarelto  # AKI-resolved Stable.  Creatinine 1.1.  Simple cystitis, resolved. Stable. Complete antibiotic course.  Diabetes Last A1c well controlled at 5.8. home medications  include semaglutide. -ssi   Diet: Heart Healthy VTE:  DOAC Code: Full PT/OT recs: SNF for Subacute PT. TOC recs: SNF, FL2 sent today Family Update: [name] updated by phone   Dispo: Anticipated discharge to Rehab in 2 days pending insurance authorization.   Laretta Bolster, M.D.  Internal Medicine Resident, PGY-1 [facility]  Pager: [phone] 12:06 PM, 11/06/2022    **Please contact the on call pager after 5 pm and on weekends at [phone].**  11:55 AM, 11/06/2022

## 2022-11-06 NOTE — Progress Notes (Signed)
   Heart Failure Stewardship Pharmacist Progress Note   PCP: [name], DO PCP-Cardiologist: Nanetta Batty, MD    HPI:  72 yo F with PMH of CHF, afib, ILD on home O2, HTN, HLD, CVA, COPD, arthritis, T2DM, DVT/PE, GERD, morbid obesity, and sleep apnea.   Presented to the ED on 7/27 with shortness of breath after a car trip. She was intubated in the ED for severe hypoxemia. Started on pressors for hypotension. CXR with cardiomegaly and pulmonary edema. ECHO 7/28 showed LVEF <20% (was 60-65% 04/2022), global hypokinesis, mild LVH, RV normal. 7/29 developed mucus plug in ET tube, became hypoxic and unresponsive and HR dropped to 30s. She required brief CPR for inadequate heart rate. Mucus plug removed and extubated 7/30. Taken for [facility] on 7/31 and found to have mild nonobstructive CAD, RA 9, PA 28, wedge 16, CO 8.7, CI 3.2.   Current HF Medications: Diuretic: torsemide 40 mg daily Beta Blocker: metoprolol XL 25 mg daily ACE/ARB/ARNI: losartan 25 mg daily MRA: spironolactone 25 mg daily  Prior to admission HF Medications: Diuretic: torsemide 40 mg daily and additional 20 mg PRN for weight gain Beta blocker: metoprolol XL 25 mg daily ACE/ARB/ARNI: losartan 25 mg daily  Pertinent Lab Values: Serum creatinine 1.14, BUN 22, Potassium 3.6, Sodium 137, BNP 630.2, Magnesium 2.0, A1c 5.8   Vital Signs: Weight: 332 lbs (admission weight: 367 lbs) Blood pressure: 120-130/70s  Heart rate: 80s  I/O: net -2L yesterday; net -16.7L since admission  Medication Assistance / Insurance Benefits Check: Does the patient have prescription insurance?  Yes Type of insurance plan: [employer] Medicare, transitioning to [facility] Medicare  Outpatient Pharmacy:  Prior to admission outpatient pharmacy: [facility] Is the patient willing to use [facility] TOC pharmacy at discharge? Yes Is the patient willing to transition their outpatient pharmacy to utilize a [facility] outpatient pharmacy?   No    Assessment: 1.  Acute on chronic systolic CHF (LVEF <20%), due to NICM. NYHA class II symptoms. - Continue torsemide 40 mg daily. Strict I/Os and daily weights. Keep K>4 and Mg>2. - Agree with increasing to metoprolol XL 25 mg daily - Continue losartan 25 mg daily - Agree with increasing to spironolactone 25 mg daily - Consider adding SGLT2i prior to discharge - has been treated for UTI   Plan: 1) Medication changes recommended at this time: - Agree with changes  2) Patient assistance: - Jardiance/Farxiga copay $0 - Entresto copay $0  3)  Education  - Patient has been educated on current HF medications and potential additions to HF medication regimen - Patient verbalizes understanding that over the next few months, these medication doses may change and more medications may be added to optimize HF regimen - Patient has been educated on basic disease state pathophysiology and goals of therapy   Sharen Hones, PharmD, BCPS Heart Failure Stewardship Pharmacist Phone [phone]

## 2022-11-06 NOTE — Progress Notes (Signed)
Physical Therapy Treatment Patient Details Name: Sharon Vasquez [name]: [id number] DOB: [birth year] Today's Date: 11/06/2022   History of Present Illness 72 yo female admitted 7/27 with respiratory distress requiring intubation with severe HFrEF EF<20%. 7/29 mucus plug with respiratory and cardiac arrest. 7/30 extubation to bipap. PMhx; HFpEF, OSA, ILD, CHF, COPD, obesity, HTN, Afib, HLD, anxiety    PT Comments  Pt agreeable to OOB progression once pre-medicated. Pt reports severe bilat knee pain R>L with mobility, but pt able to progress to repeated sit<>stands at EOB and pivot to recliner. Pt overall requiring mod-max +2 assist for transfer-level mobility at this time, and requires increased time given pain, fatigue, and tachycardia up to 163 bpm with exertion. D/c plan remains appropriate at this time.      If plan is discharge home, recommend the following: Two people to help with walking and/or transfers;Two people to help with bathing/dressing/bathroom;Assist for transportation;Help with stairs or ramp for entrance;Assistance with cooking/housework   Can travel by private vehicle        Equipment Recommendations  Other (comment) (defer)    Recommendations for Other Services       Precautions / Restrictions Precautions Precautions: Fall Restrictions Weight Bearing Restrictions: No     Mobility  Bed Mobility Overal bed mobility: Needs Assistance Bed Mobility: Supine to Sit     Supine to sit: Mod assist, HOB elevated     General bed mobility comments: assist for trunk elevation and translation of LEs over EOB, increased time and effort with heavy use of bedrails    Transfers Overall transfer level: Needs assistance Equipment used: Rolling walker (2 wheels) Transfers: Sit to/from Stand, Bed to chair/wheelchair/BSC Sit to Stand: Max assist, +2 physical assistance   Step pivot transfers: Mod assist, +2 physical assistance, From elevated surface       General transfer  comment: assist for power up, rise, steadying, and safe pivot to recliner. stand x3 from EOB, first two attempts pt unable to maintain stand and requests sit back down    Ambulation/Gait                   Stairs             Wheelchair Mobility     Tilt Bed    Modified Rankin (Stroke Patients Only)       Balance Overall balance assessment: Needs assistance Sitting-balance support: Feet supported, No upper extremity supported Sitting balance-Leahy Scale: Fair Sitting balance - Comments: sitting EOB   Standing balance support: Reliant on assistive device for balance, Bilateral upper extremity supported Standing balance-Leahy Scale: Poor Standing balance comment: reliant on AD and +2 assist                            Cognition Arousal/Alertness: Awake/alert Behavior During Therapy: WFL for tasks assessed/performed Overall Cognitive Status: Within Functional Limits for tasks assessed                                          Exercises      General Comments General comments (skin integrity, edema, etc.): Pt tachycardic up to 163 bpm during transfers      Pertinent Vitals/Pain Pain Assessment Pain Assessment: Faces Faces Pain Scale: Hurts little more Pain Location: B knees Pain Descriptors / Indicators: Aching, Discomfort Pain Intervention(s): Limited activity within patient's tolerance, Monitored  during session, Repositioned, RN gave pain meds during session    Home Living                          Prior Function            PT Goals (current goals can now be found in the care plan section) Acute Rehab PT Goals Patient Stated Goal: return home PT Goal Formulation: With patient/family Time For Goal Achievement: 11/14/22 Potential to Achieve Goals: Fair Progress towards PT goals: Progressing toward goals    Frequency    Min 1X/week      PT Plan Current plan remains appropriate    Co-evaluation               AM-PAC PT "6 Clicks" Mobility   Outcome Measure  Help needed turning from your back to your side while in a flat bed without using bedrails?: A Lot Help needed moving from lying on your back to sitting on the side of a flat bed without using bedrails?: A Lot Help needed moving to and from a bed to a chair (including a wheelchair)?: A Lot Help needed standing up from a chair using your arms (e.g., wheelchair or bedside chair)?: A Lot Help needed to walk in hospital room?: Total Help needed climbing 3-5 steps with a railing? : Total 6 Click Score: 10    End of Session   Activity Tolerance: Patient tolerated treatment well;Patient limited by pain Patient left: in chair;with call bell/phone within reach;with chair alarm set Nurse Communication: Mobility status;Need for lift equipment (stedy vs maximove for back to bed) PT Visit Diagnosis: Other abnormalities of gait and mobility (R26.89);Muscle weakness (generalized) (M62.81)     Time: 8119-1478 PT Time Calculation (min) (ACUTE ONLY): 25 min  Charges:    $Therapeutic Activity: 23-37 mins PT General Charges $$ ACUTE PT VISIT: 1 Visit                     Marye Round, PT DPT Acute Rehabilitation Services Secure Chat Preferred  Office [phone]     E Christain [place] 11/06/2022, 12:16 PM

## 2022-11-06 NOTE — Progress Notes (Signed)
Progress Note  Patient Name: Sharon Vasquez Date of Encounter: 11/06/2022 Primary Cardiologist: Nanetta Batty, MD   Subjective   Overnight symptoms have resolved. Planned for SNF transition pending insurance Patient notes that she would like to get follow up at [facility] office.  Vital Signs    Vitals:   11/06/22 0033 11/06/22 0128 11/06/22 0352 11/06/22 0720  BP: 125/75  122/76 132/81  Pulse: 88  82 88  Resp: (!) 22 (!) 24 20 17   Temp: 98.2 F (36.8 C)  98.2 F (36.8 C) 97.8 F (36.6 C)  TempSrc: Oral  Oral Oral  SpO2: 100%  99% 98%  Weight:      Height:        Intake/Output Summary (Last 24 hours) at 11/06/2022 1009 Last data filed at 11/06/2022 0727 Gross per 24 hour  Intake 477 ml  Output 2500 ml  Net -2023 ml   Filed Weights   11/03/22 0443 11/04/22 0504 11/05/22 0500  Weight: (!) 155.8 kg (!) 151 kg (!) 150.8 kg    Physical Exam   GEN: No acute distress.   Neck: thick neck Cardiac: IRIR, no rubs or gallops, distant heart sounds Respiratory: Decreased breath sounds in bases GI: Soft, nontender, distended  MS: No edema  Labs   Telemetry: Rare controlled AF   Chemistry Recent Labs  Lab 10/31/22 0428 11/01/22 0249 11/01/22 1246 11/02/22 0301 11/02/22 1215 11/04/22 0121 11/05/22 0143 11/06/22 0700  NA 140 142   < > 137   < > 137 139 137  K 4.0 3.1*   < > 2.9*   < > 3.3* 3.3* 3.6  CL 105 100  --  98   < > 100 100 101  CO2 25 30  --  28   < > 27 24 26   GLUCOSE 133* 94  --  125*   < > 112* 119* 116*  BUN 24* 16  --  15   < > 17 20 22   CREATININE 1.08* 1.10*  --  1.12*   < > 1.05* 0.98 1.14*  CALCIUM 8.6* 8.9  --  9.2   < > 9.0 9.1 9.2  PROT 5.7* 6.9  --  7.2  --   --   --   --   ALBUMIN 2.9* 3.7  --  3.8  --   --   --   --   AST 16 16  --  28  --   --   --   --   ALT 21 19  --  26  --   --   --   --   ALKPHOS 70 74  --  78  --   --   --   --   BILITOT 0.6 1.5*  --  0.9  --   --   --   --   GFRNONAA 54* 53*  --  52*   < > 56* >60 51*   ANIONGAP 10 12  --  11   < > 10 15 10    < > = values in this interval not displayed.     Hematology Recent Labs  Lab 11/03/22 2031 11/05/22 0143 11/06/22 0700  WBC 7.0 7.9 7.6  RBC 4.90 4.92 4.83  HGB 13.8 13.5 13.4  HCT 41.7 41.0 40.1  MCV 85.1 83.3 83.0  MCH 28.2 27.4 27.7  MCHC 33.1 32.9 33.4  RDW 13.7 13.5 13.5  PLT 505* 501* 483*    Cardiac EnzymesNo results for  input(s): "TROPONINI" in the last 168 hours. No results for input(s): "TROPIPOC" in the last 168 hours.   BNPNo results for input(s): "BNP", "PROBNP" in the last 168 hours.   DDimer No results for input(s): "DDIMER" in the last 168 hours.   Cardiac Studies   Cardiac Studies & Procedures   CARDIAC CATHETERIZATION  CARDIAC CATHETERIZATION 11/01/2022  Narrative   Prox LAD lesion is 35% stenosed.   LV end diastolic pressure is normal.   Hemodynamic findings consistent with mild pulmonary hypertension.  Mild nonobstructive CAD Normal LV filling pressures. PCWP 19/22 with mean 16 mm Hg. LVEDP 14 mm Hg Mild pulmonary HTN PAP 40/20 with mean 28 mm Hg Normal RA pressure 9 mm Hg Normal Cardiac output 8.72 L/min, index 3.21  Plan: medical management  Findings Coronary Findings Diagnostic  Dominance: Left  Left Main Vessel was injected. Vessel is normal in caliber. Vessel is angiographically normal.  Left Anterior Descending Prox LAD lesion is 35% stenosed.  Left Circumflex Vessel was injected. Vessel is large. The vessel exhibits minimal luminal irregularities.  Right Coronary Artery Vessel was injected. Vessel is small. Vessel is angiographically normal.  Intervention  No interventions have been documented.     ECHOCARDIOGRAM  ECHOCARDIOGRAM COMPLETE 10/29/2022  Narrative ECHOCARDIOGRAM REPORT    Patient Name:   Sharon Vasquez Date of Exam: 10/29/2022 Medical Rec #:  [id number]      Height:       74.0 in Accession #:    [id number]     Weight:       367.1 lb Date of Birth:  [birth year]       BSA:          2.813 m Patient Age:    72 years       BP:           111/90 mmHg Patient Gender: F              HR:           78 bpm. Exam Location:  Inpatient  Procedure: 2D Echo, Color Doppler, Cardiac Doppler and Intracardiac Opacification Agent  Indications:    Congestive Heart Failure I50.9  History:        Patient has prior history of Echocardiogram examinations, most recent 04/27/2022. CHF, COPD, Stroke and Breast Cancer, Arrythmias:Atrial Fibrillation; Risk Factors:Former Smoker, Hypertension and Diabetes.  Sonographer:    Aron Baba Referring Phys: [id number] Lorin Glass   Sonographer Comments: Technically difficult study due to poor echo windows, patient is obese and echo performed with patient supine and on artificial respirator. IMPRESSIONS   1. LV septum is aneurysmal towards the apex. Left ventricular ejection fraction, by estimation, is <20%. The left ventricle has severely decreased function. The left ventricle demonstrates global hypokinesis. There is mild left ventricular hypertrophy. Left ventricular diastolic parameters are indeterminate. 2. Right ventricular systolic function is normal. The right ventricular size is normal. There is normal pulmonary artery systolic pressure. 3. Left atrial size was moderately dilated. 4. The mitral valve is normal in structure. No evidence of mitral valve regurgitation. 5. The aortic valve is tricuspid. Aortic valve regurgitation is not visualized. 6. There is mild dilatation of the ascending aorta, measuring 41 mm. 7. The inferior vena cava is dilated in size with <50% respiratory variability, suggesting right atrial pressure of 15 mmHg.  FINDINGS Left Ventricle: LV septum is aneurysmal towards the apex. Left ventricular ejection fraction, by estimation, is <20%. The left ventricle has severely decreased function. The left  ventricle demonstrates global hypokinesis. Definity contrast agent was given IV to delineate the left  ventricular endocardial borders. The left ventricular internal cavity size was normal in size. There is mild left ventricular hypertrophy. Left ventricular diastolic parameters are indeterminate.  Right Ventricle: The right ventricular size is normal. Right ventricular systolic function is normal. There is normal pulmonary artery systolic pressure. The tricuspid regurgitant velocity is 1.95 m/s, and with an assumed right atrial pressure of 15 mmHg, the estimated right ventricular systolic pressure is 30.2 mmHg.  Left Atrium: Left atrial size was moderately dilated.  Right Atrium: Right atrial size was normal in size.  Pericardium: There is no evidence of pericardial effusion.  Mitral Valve: The mitral valve is normal in structure. No evidence of mitral valve regurgitation.  Tricuspid Valve: Tricuspid valve regurgitation is not demonstrated.  Aortic Valve: The aortic valve is tricuspid. Aortic valve regurgitation is not visualized.  Pulmonic Valve: Pulmonic valve regurgitation is not visualized.  Aorta: There is mild dilatation of the ascending aorta, measuring 41 mm.  Venous: The inferior vena cava is dilated in size with less than 50% respiratory variability, suggesting right atrial pressure of 15 mmHg.  IAS/Shunts: No atrial level shunt detected by color flow Doppler.   LEFT VENTRICLE PLAX 2D LVIDd:         5.90 cm   Diastology LVIDs:         5.60 cm   LV e' medial:    4.22 cm/s LV PW:         1.40 cm   LV E/e' medial:  22.9 LV IVS:        1.10 cm   LV e' lateral:   4.45 cm/s LVOT diam:     2.20 cm   LV E/e' lateral: 21.7 LV SV:         48 LV SV Index:   17 LVOT Area:     3.80 cm   RIGHT VENTRICLE RV S prime:     10.80 cm/s TAPSE (M-mode): 1.0 cm  LEFT ATRIUM            Index        RIGHT ATRIUM           Index LA diam:      5.10 cm  1.81 cm/m   RA Area:     16.20 cm LA Vol (A2C): 81.3 ml  28.90 ml/m  RA Volume:   38.90 ml  13.83 ml/m LA Vol (A4C): 121.0 ml 43.01  ml/m AORTIC VALVE LVOT Vmax:   84.20 cm/s LVOT Vmean:  48.900 cm/s LVOT VTI:    0.127 m  AORTA Ao Root diam: 3.40 cm Ao Asc diam:  4.10 cm  MITRAL VALVE               TRICUSPID VALVE MV Area (PHT): 5.66 cm    TR Peak grad:   15.2 mmHg MV Decel Time: 134 msec    TR Vmax:        195.00 cm/s MR Peak grad: 23.2 mmHg MR Vmax:      241.00 cm/s  SHUNTS MV E velocity: 96.60 cm/s  Systemic VTI:  0.13 m MV A velocity: 24.00 cm/s  Systemic Diam: 2.20 cm MV E/A ratio:  4.03  [place] signed by Carolan Clines Signature Date/Time: 10/29/2022/1:12:54 PM    Final     CT SCANS  CT CORONARY MORPH W/CTA COR W/SCORE 05/07/2018  Addendum 05/07/2018  4:30 PM ADDENDUM REPORT: 05/07/2018 16:28  CLINICAL  DATA:  72 year old female with obesity, hypertension, smoking and atypical chest pain.  EXAM: Cardiac/Coronary  CT  TECHNIQUE: The patient was scanned on a [employer].  FINDINGS: A 120 kV prospective scan was triggered in the descending thoracic aorta at 111 HU's. Axial non-contrast 3 mm slices were carried out through the heart. The data set was analyzed on a dedicated work station and scored using the Agatson method. Gantry rotation speed was 250 msecs and collimation was .6 mm. No beta blockade and 0.8 mg of sl NTG was given. The 3D data set was reconstructed in 5% intervals of the 67-82 % of the R-R cycle. Diastolic phases were analyzed on a dedicated work station using MPR, MIP and VRT modes. The patient received 80 cc of contrast.  Aorta:  Normal size.  Trivial calcifications.  No dissection.  Aortic Valve:  Trileaflet.  No calcifications.  Coronary Arteries:  Normal coronary origin.  Left dominance.  Left main is a large artery that gives rise to LAD and LCX arteries. There is mild calcified plaque with stenosis 25-50%.  LAD is a large vessel that has moderate to severe predominantly calcified plaque in the proximal portion with associated  stenosis 50-69%.  D1 is medium caliber artery that has moderate diffuse calcified plaque with suspicion for a stenosis > 70% in the mid portion.  LCX is a large dominant artery that gives rise to one OM1 branch and PDA. PLA is not visualized. There is long diffuse calcified plaque in the distal LCX artery with stenosis 50-69%.  OM1 and PDA are poorly visualized secondary to patient's size but appear to have mild diffuse plaque.  RCA is a small caliber non-dominant artery that has moderate diffuse calcified plaque with stenosis 50-69%.  Other findings:  Normal pulmonary vein drainage into the left atrium.  Normal let atrial appendage without a thrombus.  IMPRESSION: 1. Coronary calcium score of 2110. This was 74 percentile for age and sex matched control.  2. Normal coronary origin with left dominance.  3. Moderate to severe diffuse CAD with stenosis 50-69% in the proximal LAD, distal LCX arteries and suspicion for a severe stenosis in the mid portion of the 1. diagonal artery. Additional analysis with CT FFR will be submitted.  4. Moderately dilated pulmonary artery measuring 37 mm suspicious of pulmonary hypertension.   Electronically Signed By: Tobias Alexander On: 05/07/2018 16:28  Narrative EXAM: OVER-READ INTERPRETATION  CT CHEST  The following report is an over-read performed by radiologist Dr. Charlett Nose of [facility] on 05/07/2018. This over-read does not include interpretation of cardiac or coronary anatomy or pathology. The coronary CTA interpretation by the cardiologist is attached.  COMPARISON:  12/31/2017  FINDINGS: Vascular: Heart is mildly enlarged. Calcifications in the aortic root. Aorta is normal caliber.  Mediastinum/Nodes: No adenopathy in the lower mediastinum or hila.  Lungs/Pleura: No confluent opacities or effusions.  Upper Abdomen: Imaging into the upper abdomen shows no acute findings.  Musculoskeletal: Chest wall  soft tissues are unremarkable. No acute bony abnormality.  IMPRESSION: Mild cardiomegaly. Calcifications in the aortic root. No acute extra cardiac abnormality.  Electronically Signed: By: Charlett Nose M.D. On: 05/07/2018 13:00               Assessment & Plan     Acute combined HFrEF, EF 20%, dilated with morphology suggestive of atypical stress cardiomyopathy - continue ARB, very low dose BB (concomitant COPD), and MRA - if UTI's resolve outpatient SGLT2i - if BP  allows, would favor metoprolol-> bisoprolol transition over ARNI  - continue ARB - Keep K >4, Mg>2. - torsemide 40 mg PO daily (higher dose than prior to admission) - Follow-up echo in 6 weeks   Persistent rare controlled Afib -metoprolol and xarelto -amiodarone held for sinus pauses -continue telemetry   AKI -resolved  Acute on chronic hypoxic and hypercarbic respiratory failure - COPD on 2-3 L at baseline - s/p cardiac arrest from mucus plug 7/29, has been intubated through course - as per primary  Non obstructive CAD HLD - continue statin  OSA - CPAP is recommended  Sign off  - stopping home ASA given Stress cardiomyopathy variant not LAD infarct - Increase torseminde dose - will attempt outpatient f/u at [facility]. Office per her request  For questions or updates, please contact [facility] Please consult [email or web address] for contact info under Cardiology/STEMI.      Riley Lam, MD FASE [facility] Cardiologist [facility]  [address] [place], [address] [place], [place] [address] [phone]  10:09 AM

## 2022-11-06 NOTE — Discharge Instructions (Addendum)
Sharon Vasquez, it was a pleasure taking care of you in the hospital. I'm glad you're feeling better, I want you to continue your medications like spironolactone, metoprolol, losartan, and torsemide. Most importantly, it will be important to monitor how much fluid you're in taking, I would recommend no more than 1.5L of fluid per day, which the facility will help you obtain. You have an appointment with the heart doctors on 8/15 at 10AM, and a follow up with [place] in the clinic at 8/15 at [facility]. Take care!

## 2022-11-06 NOTE — TOC Transition Note (Signed)
Transition of Care [facility]) - CM/SW Discharge Note   Patient Details  Name: Sharon Vasquez [name]: [id number] Date of Birth: [birth year]  [facility] CM/SW Contact:  Leander Rams, LCSW Phone Number: 11/06/2022, 4:42 PM   Clinical Narrative:    Patient will DC to: [facility] SNF Anticipated DC date: 11/06/2022 Family notified: VM left with [facility] Transport by: Sharin Mons   Per MD patient ready for DC to [facility]. RN, patient, patient's family, and facility notified of DC. Discharge Summary and FL2 sent to facility. RN to call report prior to discharge [phone]. DC packet on chart. Ambulance transport requested for patient.   CSW will sign off for now as social work intervention is no longer needed. Please consult [place] again if new needs arise.    Final next level of care: Skilled Nursing Facility Barriers to Discharge: No Barriers Identified   Patient Goals and CMS Choice      Discharge Placement                Patient chooses bed at: [facility] Patient to be transferred to facility by: [facility] Name of family member notified: VM left withToya Patient and family notified of of transfer: 11/06/22  Discharge Plan and Services Additional resources added to the After Visit Summary for       Post Acute Care Choice: Skilled Nursing Facility                               Social Determinants of Health (SDOH) Interventions SDOH Screenings   Food Insecurity: No Food Insecurity (11/03/2022)  Housing: Low Risk  (11/03/2022)  Transportation Needs: No Transportation Needs (11/03/2022)  Utilities: Not At Risk (11/03/2022)  Alcohol Screen: Low Risk  (12/02/2021)  Depression (PHQ2-9): Medium Risk (10/25/2022)  Financial Resource Strain: Medium Risk (12/02/2021)  Physical Activity: Inactive (12/02/2021)  Social Connections: Moderately Integrated (03/15/2022)  Stress: No Stress Concern Present (12/02/2021)  Tobacco Use: Medium Risk (10/28/2022)     Readmission Risk  Interventions     No data to display          Oletta Lamas, MSW, LCSWA, LCASA Transitions of Care  Clinical Social Worker I

## 2022-11-07 ENCOUNTER — Other Ambulatory Visit: Payer: Self-pay

## 2022-11-07 DIAGNOSIS — K219 Gastro-esophageal reflux disease without esophagitis: Secondary | ICD-10-CM | POA: Diagnosis not present

## 2022-11-07 DIAGNOSIS — M170 Bilateral primary osteoarthritis of knee: Secondary | ICD-10-CM | POA: Diagnosis not present

## 2022-11-07 DIAGNOSIS — I4891 Unspecified atrial fibrillation: Secondary | ICD-10-CM | POA: Diagnosis not present

## 2022-11-07 DIAGNOSIS — J449 Chronic obstructive pulmonary disease, unspecified: Secondary | ICD-10-CM | POA: Diagnosis not present

## 2022-11-07 NOTE — Consult Note (Signed)
   [facility] CM Inpatient Consult   11/07/2022  DEBORRAH SCHONER [birth year] [id number]  Follow up:  Patient went to [facility] for rehab  [facility] [[facility]]  Accountable Care Organization [ACO] Patient: Advertising copywriter Dual  Primary Care Provider:  Rayvon Char, [facility]   Patient noted to have transitioned to a [facility] affiliated facility and patient can be followed by [facility] RN with traditional Medicare and approved Medicare Advantage plans.    Plan:   Will notify the Community [facility] RN can follow for any known or needs for transitional care needs for returning to post facility care coordination needs to return to community.  For questions or referrals, please contact:   Charlesetta Shanks, RN BSN CCM [facility]  [phone] business mobile phone Toll free office [phone]  *Concierge Line  [phone] Fax number: [phone] [name]

## 2022-11-08 ENCOUNTER — Encounter (HOSPITAL_BASED_OUTPATIENT_CLINIC_OR_DEPARTMENT_OTHER): Payer: 59 | Admitting: Physician Assistant

## 2022-11-08 DIAGNOSIS — I5022 Chronic systolic (congestive) heart failure: Secondary | ICD-10-CM | POA: Diagnosis not present

## 2022-11-08 DIAGNOSIS — J9611 Chronic respiratory failure with hypoxia: Secondary | ICD-10-CM | POA: Diagnosis not present

## 2022-11-08 DIAGNOSIS — R2681 Unsteadiness on feet: Secondary | ICD-10-CM | POA: Diagnosis not present

## 2022-11-08 DIAGNOSIS — I4891 Unspecified atrial fibrillation: Secondary | ICD-10-CM | POA: Diagnosis not present

## 2022-11-08 DIAGNOSIS — M6281 Muscle weakness (generalized): Secondary | ICD-10-CM | POA: Diagnosis not present

## 2022-11-08 DIAGNOSIS — Z741 Need for assistance with personal care: Secondary | ICD-10-CM | POA: Diagnosis not present

## 2022-11-08 NOTE — Telephone Encounter (Signed)
Patient isnt sure why she had called. She is now in rehab. Has appt 8/28 and will go over everything at that time.

## 2022-11-13 DIAGNOSIS — Z7189 Other specified counseling: Secondary | ICD-10-CM | POA: Diagnosis not present

## 2022-11-14 DIAGNOSIS — R2681 Unsteadiness on feet: Secondary | ICD-10-CM | POA: Diagnosis not present

## 2022-11-14 DIAGNOSIS — I4891 Unspecified atrial fibrillation: Secondary | ICD-10-CM | POA: Diagnosis not present

## 2022-11-14 DIAGNOSIS — M6281 Muscle weakness (generalized): Secondary | ICD-10-CM | POA: Diagnosis not present

## 2022-11-14 DIAGNOSIS — I5022 Chronic systolic (congestive) heart failure: Secondary | ICD-10-CM | POA: Diagnosis not present

## 2022-11-14 DIAGNOSIS — J9611 Chronic respiratory failure with hypoxia: Secondary | ICD-10-CM | POA: Diagnosis not present

## 2022-11-14 DIAGNOSIS — Z741 Need for assistance with personal care: Secondary | ICD-10-CM | POA: Diagnosis not present

## 2022-11-15 ENCOUNTER — Telehealth: Payer: Self-pay

## 2022-11-15 DIAGNOSIS — E1140 Type 2 diabetes mellitus with diabetic neuropathy, unspecified: Secondary | ICD-10-CM | POA: Diagnosis not present

## 2022-11-15 NOTE — Patient Outreach (Signed)
  Care Coordination   Follow Up Visit Note   11/15/2022 Name: Sharon Vasquez [name]: [id number] DOB: 07-03-1950  Sharon Vasquez is a 72 y.o. year old female who sees At[place], Rayann N, DO for primary care. I spoke with  [name] by phone today.  What matters to the patients health and wellness today?  Discharge from SNF to soon.    Goals Addressed               This Visit's Progress     COMPLETED: I am at [facility] and they are discharging me (pt-stated)        Care Coordination Interventions: Patient called this writer to update on her being at [facility] and they are going to discharge her soon and she is not ready to go home.  Patient states her weight is up 10lbs and she does not have strength to go home alone.  Discussed with patient that when she receives her Notification of Medical Non-Coverage letter that she has the right to appeal her discharge.  Encouraged patient to discuss with Child psychotherapist at facility.   Plan to follow up with patient next week.          SDOH assessments and interventions completed:  Yes     Care Coordination Interventions:  Yes, provided   Follow up plan: Follow up call scheduled for 11/21/22    Encounter Outcome:  Pt. Visit Completed

## 2022-11-16 ENCOUNTER — Ambulatory Visit (HOSPITAL_COMMUNITY)
Admit: 2022-11-16 | Discharge: 2022-11-16 | Disposition: A | Discharge status: Home / Self Care | Payer: 59 | Attending: Adult Health | Admitting: Adult Health

## 2022-11-16 ENCOUNTER — Telehealth: Payer: Self-pay

## 2022-11-16 ENCOUNTER — Ambulatory Visit (INDEPENDENT_AMBULATORY_CARE_PROVIDER_SITE_OTHER): Payer: 59 | Admitting: Student

## 2022-11-16 ENCOUNTER — Encounter (HOSPITAL_COMMUNITY): Payer: Self-pay

## 2022-11-16 VITALS — BP 118/82 | HR 75 | Wt 346.5 lb

## 2022-11-16 VITALS — BP 115/69 | HR 79 | Temp 98.4°F | Ht 74.0 in | Wt 346.8 lb

## 2022-11-16 DIAGNOSIS — I2510 Atherosclerotic heart disease of native coronary artery without angina pectoris: Secondary | ICD-10-CM | POA: Insufficient documentation

## 2022-11-16 DIAGNOSIS — Z79899 Other long term (current) drug therapy: Secondary | ICD-10-CM | POA: Insufficient documentation

## 2022-11-16 DIAGNOSIS — M6281 Muscle weakness (generalized): Secondary | ICD-10-CM | POA: Diagnosis not present

## 2022-11-16 DIAGNOSIS — Z6841 Body Mass Index (BMI) 40.0 and over, adult: Secondary | ICD-10-CM

## 2022-11-16 DIAGNOSIS — M792 Neuralgia and neuritis, unspecified: Secondary | ICD-10-CM | POA: Diagnosis not present

## 2022-11-16 DIAGNOSIS — I4891 Unspecified atrial fibrillation: Secondary | ICD-10-CM | POA: Insufficient documentation

## 2022-11-16 DIAGNOSIS — J449 Chronic obstructive pulmonary disease, unspecified: Secondary | ICD-10-CM | POA: Insufficient documentation

## 2022-11-16 DIAGNOSIS — G4733 Obstructive sleep apnea (adult) (pediatric): Secondary | ICD-10-CM | POA: Diagnosis not present

## 2022-11-16 DIAGNOSIS — I428 Other cardiomyopathies: Secondary | ICD-10-CM | POA: Diagnosis not present

## 2022-11-16 DIAGNOSIS — Z7901 Long term (current) use of anticoagulants: Secondary | ICD-10-CM | POA: Diagnosis not present

## 2022-11-16 DIAGNOSIS — I5032 Chronic diastolic (congestive) heart failure: Secondary | ICD-10-CM

## 2022-11-16 DIAGNOSIS — Z5986 Financial insecurity: Secondary | ICD-10-CM | POA: Insufficient documentation

## 2022-11-16 DIAGNOSIS — E119 Type 2 diabetes mellitus without complications: Secondary | ICD-10-CM

## 2022-11-16 DIAGNOSIS — K219 Gastro-esophageal reflux disease without esophagitis: Secondary | ICD-10-CM | POA: Diagnosis not present

## 2022-11-16 DIAGNOSIS — I5020 Unspecified systolic (congestive) heart failure: Secondary | ICD-10-CM | POA: Diagnosis not present

## 2022-11-16 DIAGNOSIS — R7303 Prediabetes: Secondary | ICD-10-CM

## 2022-11-16 DIAGNOSIS — I5022 Chronic systolic (congestive) heart failure: Secondary | ICD-10-CM | POA: Diagnosis not present

## 2022-11-16 DIAGNOSIS — Z9981 Dependence on supplemental oxygen: Secondary | ICD-10-CM | POA: Insufficient documentation

## 2022-11-16 DIAGNOSIS — Z87891 Personal history of nicotine dependence: Secondary | ICD-10-CM | POA: Diagnosis not present

## 2022-11-16 DIAGNOSIS — J9611 Chronic respiratory failure with hypoxia: Secondary | ICD-10-CM | POA: Insufficient documentation

## 2022-11-16 DIAGNOSIS — Z7984 Long term (current) use of oral hypoglycemic drugs: Secondary | ICD-10-CM

## 2022-11-16 DIAGNOSIS — M1990 Unspecified osteoarthritis, unspecified site: Secondary | ICD-10-CM | POA: Diagnosis not present

## 2022-11-16 DIAGNOSIS — I110 Hypertensive heart disease with heart failure: Secondary | ICD-10-CM | POA: Insufficient documentation

## 2022-11-16 DIAGNOSIS — Z8639 Personal history of other endocrine, nutritional and metabolic disease: Secondary | ICD-10-CM

## 2022-11-16 DIAGNOSIS — Z853 Personal history of malignant neoplasm of breast: Secondary | ICD-10-CM | POA: Diagnosis not present

## 2022-11-16 DIAGNOSIS — I5023 Acute on chronic systolic (congestive) heart failure: Secondary | ICD-10-CM | POA: Diagnosis not present

## 2022-11-16 DIAGNOSIS — R2681 Unsteadiness on feet: Secondary | ICD-10-CM | POA: Diagnosis not present

## 2022-11-16 LAB — BASIC METABOLIC PANEL
Anion gap: 12 (ref 5–15)
BUN: 20 mg/dL (ref 8–23)
CO2: 25 mmol/L (ref 22–32)
Calcium: 9.3 mg/dL (ref 8.9–10.3)
Chloride: 101 mmol/L (ref 98–111)
Creatinine, Ser: 1.15 mg/dL — ABNORMAL HIGH (ref 0.44–1.00)
GFR, Estimated: 50 mL/min — ABNORMAL LOW (ref >60)
Glucose, Bld: 120 mg/dL — ABNORMAL HIGH (ref 70–99)
Potassium: 3.9 mmol/L (ref 3.5–5.1)
Sodium: 138 mmol/L (ref 135–145)

## 2022-11-16 MED ORDER — EMPAGLIFLOZIN 10 MG PO TABS: 10.0000 mg | ORAL_TABLET | Freq: Every day | ORAL | 2 refills | Status: DC

## 2022-11-16 MED ORDER — GABAPENTIN 400 MG PO CAPS: 400.0000 mg | ORAL_CAPSULE | Freq: Two times a day (BID) | ORAL | 2 refills | Status: DC

## 2022-11-16 MED ORDER — TORSEMIDE 20 MG PO TABS: 60.0000 mg | ORAL_TABLET | Freq: Every day | ORAL | 2 refills | Status: DC

## 2022-11-16 NOTE — Progress Notes (Signed)
Established Patient Office Visit  Subjective   Patient ID: Sharon Vasquez, female    DOB: January 25, 1951  Age: 72 y.o. [name]: [id number]  Chief Complaint  Patient presents with   hospiital follow up   Leg Pain    HPI  Sharon Vasquez is a 72 y.o. who presents for a follow up on a most recent hospitalization from 7/27-8/5 for acute on chronic HFrEF   Past Medical History:  Diagnosis Date   A-fib [facility])    Acute hypoxemic respiratory failure (HCC) 02/20/2022   Acute on chronic hypoxic respiratory failure (HCC) 01/04/2022   Acute upper respiratory infection 01/08/2022   Anxiety    Arthritis    "qwhre; joints, back" (04/17/2017)   Atrial fibrillation (HCC)    Benign breast cyst in female, left 01/08/2017   Found by Screening mammogram, evaluated by U/S on 01/08/17 and determined to be a benign simple breast cyst.   Breast cancer (HCC)    Cellulitis of left lower leg 05/30/2017   CHF (congestive heart failure) (HCC)    Chronic low back pain 08/21/2016   Chronic lower back pain    Chronic venous insufficiency    /notes 05/30/2017   COPD (chronic obstructive pulmonary disease) (HCC)    Depression    Diabetes (HCC) 01/08/2022   Diabetes mellitus without complication (HCC)    DVT (deep venous thrombosis) (HCC) 11/16/2016   Dysrhythmia    Esophageal dysmotility 11/10/2019   Previous workup by ENT with fiberoptic leryngoscopy which was normal. Dx with laryngeal pharyngeal reflux.   GERD (gastroesophageal reflux disease)    Headache    "weekly for the last 3 months" (04/17/2017)   History of pulmonary embolism    Pulmonary embolism   Hyperlipidemia    Hypertension    Laryngopharyngeal reflux 12/18/2015   Morbid obesity (HCC)    PE (pulmonary embolism)    Pulmonary embolism (HCC) 09/21/2014   Sleep apnea    Stroke (HCC) 12/02/2021     Review of Systems  Constitutional:  Positive for malaise/fatigue. Negative for chills and fever.  Respiratory:  Positive for cough and shortness of  breath. Negative for sputum production.   Cardiovascular:  Negative for chest pain.  Gastrointestinal:  Negative for nausea and vomiting.     Objective:     BP 115/69 (BP Location: Left Arm, Patient Position: Sitting, Cuff Size: Small)   Pulse 79   Temp 98.4 F (36.9 C) (Oral)   Ht 6\' 2"  (1.88 m)   Wt (!) 346 lb 12.8 oz (157.3 kg)   SpO2 95%   BMI 44.53 kg/m  Wt Readings from Last 3 Encounters:  11/16/22 (!) 346 lb 8 oz (157.2 kg)  11/16/22 (!) 346 lb 12.8 oz (157.3 kg)  11/05/22 (!) 332 lb 7.3 oz (150.8 kg)     Physical Exam Cardiovascular:     Rate and Rhythm: Normal rate and regular rhythm.     Heart sounds: S1 normal and S2 normal.     No friction rub. No gallop. No S3 or S4 sounds.  Pulmonary:     Breath sounds: No decreased breath sounds, wheezing, rhonchi or rales.     Comments: On Oxygen Abdominal:     General: Abdomen is protuberant. There is no distension.     Tenderness: There is no abdominal tenderness.  Musculoskeletal:     Right lower leg: Edema present.     Left lower leg: Edema present.    Results for orders placed or performed during the  hospital encounter of 11/16/22  Basic metabolic panel  Result Value Ref Range   Sodium 138 135 - 145 mmol/L   Potassium 3.9 3.5 - 5.1 mmol/L   Chloride 101 98 - 111 mmol/L   CO2 25 22 - 32 mmol/L   Glucose, Bld 120 (H) 70 - 99 mg/dL   BUN 20 8 - 23 mg/dL   Creatinine, Ser 1.61 (H) 0.44 - 1.00 mg/dL   Calcium 9.3 8.9 - 09.6 mg/dL   GFR, Estimated 50 (L) >60 mL/min   Anion gap 12 5 - 15    Last CBC Lab Results  Component Value Date   WBC 7.6 11/06/2022   HGB 13.4 11/06/2022   HCT 40.1 11/06/2022   MCV 83.0 11/06/2022   MCH 27.7 11/06/2022   RDW 13.5 11/06/2022   PLT 483 (H) 11/06/2022   Last metabolic panel Lab Results  Component Value Date   GLUCOSE 120 (H) 11/16/2022   NA 138 11/16/2022   K 3.9 11/16/2022   CL 101 11/16/2022   CO2 25 11/16/2022   BUN 20 11/16/2022   CREATININE 1.15 (H)  11/16/2022   GFRNONAA 50 (L) 11/16/2022   CALCIUM 9.3 11/16/2022   PHOS 3.3 11/02/2022   PROT 7.2 11/02/2022   ALBUMIN 3.8 11/02/2022   BILITOT 0.9 11/02/2022   ALKPHOS 78 11/02/2022   AST 28 11/02/2022   ALT 26 11/02/2022   ANIONGAP 12 11/16/2022   Last lipids Lab Results  Component Value Date   CHOL 91 (L) 08/25/2021   HDL 26 (L) 08/25/2021   LDLCALC 46 08/25/2021   TRIG 170 (H) 10/29/2022   CHOLHDL 3.5 08/25/2021   Last hemoglobin A1c Lab Results  Component Value Date   HGBA1C 5.8 (H) 10/29/2022    The ASCVD Risk score (Arnett DK, et al., 2019) failed to calculate for the following reasons:   The patient has a prior MI or stroke diagnosis    Assessment & Plan:   Problem List Items Addressed This Visit       Cardiovascular and Mediastinum   (HFpEF) heart failure with preserved ejection fraction (HCC) (Chronic)    Comes from a follow up from her most recent hospitalization from 7/27-8/5. Developed SOB after a car trip. She was intubated in the ED for severe hypoxemia. Started on pressors for hypotension. A CXR was obtained and was significant cardiomegaly and pulmonary edema.  On 7/31 got an echo which showed LV septum is aneurysmal towards the apex. Left ventricular ejection fraction, by estimation, is <20%  significantly changed from her echo in January showing an EF of 60-65%. On 7/29 she developed a mucus plug, became hypoxic, and then required CPR for inadequate HR as it had dropped in the 30s. The mucus plug was removed and she was extubated 7/30. The patient was back to 2-3L of Oxygen at home.  Since discharge, she saw cardiology today, which increased torsemide to 60mg  3 tablets by mouth daily and started on Jardiance10mg  by mouth daily. Her weight is up 14lbs since two weeks ago. Severe edema on the bilateral lower legs. No rales on auscultation. She is also scared as she does not feel ready to go home and has no assistance living alone, SW is on board.  Cw  metoprolol 25mg , spironolactone 25mg , losartan 25mg .  -start meds prescribed by cardiology today (Torsemide increased 60mg  daily and Jardiance 10mg  daily)+ -Continue use of albuterol and trelegy -follow up with cardiology  -follow up with social work  Atrial fibrillation with controlled ventricular response (HCC)    Continue Metoprolol XR 25mg  and Xarelto 20mg  daily.           Other   Morbid obesity (HCC) (Chronic)    Start [place], approved today.       Prediabetes    Started Jardiance. Reginal Lutes approved today.      Neuropathic pain    Uses gabapentin 400mg  BID.  -Refill sent       History of hypokalemia    Hypokalemic in the hospital, BMP 3.9 today (3.3 in the past).       Other Visit Diagnoses     Type 2 diabetes mellitus without complication, unspecified whether long term insulin use (HCC)    -  Primary   Relevant Medications   gabapentin (NEURONTIN) 400 MG capsule      Return in about 4 weeks (around 12/14/2022).    Manuela Neptune, MD

## 2022-11-16 NOTE — Patient Instructions (Addendum)
EKG done today.  Labs done today. We will contact you only if your labs are abnormal.  START Jardiance 10mg  (1 tablet) by mouth daily.  INCREASE Torsemide to 60mg  (3 tablets) by mouth daily. PLEASE NOTE THE TABLET MG CHANGE  No other medication changes were made. Please continue all current medications as prescribed.  Your physician recommends that you schedule a follow-up appointment in: 2-3 weeks  If you have any questions or concerns before your next appointment please send [place] a message through [place] or call our office at [phone].    TO LEAVE A MESSAGE FOR THE NURSE SELECT OPTION 2, PLEASE LEAVE A MESSAGE INCLUDING: YOUR NAME DATE OF BIRTH CALL BACK NUMBER REASON FOR CALL**this is important as we prioritize the call backs  YOU WILL RECEIVE A CALL BACK THE SAME DAY AS LONG AS YOU CALL BEFORE 4:00 PM   Do the following things EVERYDAY: Weigh yourself in the morning before breakfast. Write it down and keep it in a log. Take your medicines as prescribed Eat low salt foods--Limit salt (sodium) to 2000 mg per day.  Stay as active as you can everyday Limit all fluids for the day to less than 2 liters   At the [facility], you and your health needs are our priority. As part of our continuing mission to provide you with exceptional heart care, we have created designated Provider Care Teams. These Care Teams include your primary Cardiologist (physician) and Advanced Practice Providers (APPs- Physician Assistants and Nurse Practitioners) who all work together to provide you with the care you need, when you need it.   You may see any of the following providers on your designated Care Team at your next follow up: Dr Arvilla Meres Dr Marca Ancona Dr. Marcos Eke, NP Robbie Lis, [place] [facility] [place], [place] Brynda Peon, NP Karle Plumber, PharmD   Please be sure to bring in all your medications bottles to every appointment.     Thank you for choosing [facility]

## 2022-11-16 NOTE — Patient Instructions (Addendum)
Thank you, [name] for allowing [place] to provide your care today. Today we discussed the medications that your cardiologist put you on.   Please make sure you pick up those medications.  The Reginal Lutes was already ordered and is ready for pick up  I have ordered the following medication/changed the following medications:   Stop the following medications: There are no discontinued medications.   Start the following medications: Meds ordered this encounter  Medications   gabapentin (NEURONTIN) 400 MG capsule    Sig: Take 1 capsule (400 mg total) by mouth 2 (two) times daily.    Dispense:  60 capsule    Refill:  2    Follow up with [place] in a month.   Let us know if you do not hear back from your referrals or social work, or if there is anything  you need.   Should you have any questions or concerns please call the internal medicine clinic at [phone].     Manuela Neptune, MD [facility]

## 2022-11-16 NOTE — Telephone Encounter (Signed)
Prior Authorization for patient Sharon Vasquez) came through on cover my meds was submitted with last office notes awaiting approval or denial.  WUJ:[id number]

## 2022-11-16 NOTE — Progress Notes (Signed)
HEART & VASCULAR TRANSITION OF CARE CONSULT NOTE     Referring Physician:Dr Hoffman  Primary Care: Dr Ned Card  Primary Cardiologist: Dr Allyson Sabal  Oncology: Dr Pamelia Hoit   HPI: Referred to clinic by Dr Mikey Bussing for heart failure consultation.   [name] is a 72 year old with a history of HFrEF, COPD, A fib, OSA,  GERD, DJD, arthritis, and breast cancer.    Admitted with acute hypoxix respiratory failure requiring intubation. Hospital coures c/b respiratory arrest due to mucus plug and new acute HFrEF.  Echo showeded newly reduced EF possibly due to stress. Cath with minimal disease. Started on   Overall feeling fine. SOB with exertion. Denies PND/Orthopnea. Limited by her knees and back. Uses an Mining engineer wheelchair. Appetite ok. No fever or chills. Weight at home has gone 12-13 pounds at the SNF.  Taking all medications. Plans to discharge back to her apartment soon.  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    Cardiac Testing  Cath 11/01/22    Prox LAD lesion is 35% stenosed.   LV end diastolic pressure is normal.   Hemodynamic findings consistent with mild pulmonary hypertension.  Mild nonobstructive CAD Normal LV filling pressures. PCWP 19/22 with mean 16 mm Hg. LVEDP 14 mm Hg Mild pulmonary HTN PAP 40/20 with mean 28 mm Hg Normal RA pressure 9 mm Hg Normal Cardiac output 8.72 L/min, index 3.21  Echo 10/29/22   1. LV septum is aneurysmal towards the apex. Left ventricular ejection  fraction, by estimation, is <20%. The left ventricle has severely  decreased function. The left ventricle demonstrates global hypokinesis.  There is mild left ventricular hypertrophy.  Left ventricular diastolic parameters are indeterminate.   2. Right ventricular systolic function is normal. The right ventricular  size is normal. There is normal pulmonary artery systolic pressure.   3. Left atrial size was moderately dilated.   4. The mitral valve is normal in structure. No evidence of mitral  valve  regurgitation.   5. The aortic valve is tricuspid. Aortic valve regurgitation is not  visualized.   6. There is mild dilatation of the ascending aorta, measuring 41 mm.   Echo 04/2022   1. Left ventricular ejection fraction, by estimation, is 60 to 65%. The  left ventricle has normal function. The left ventricle has no regional  wall motion abnormalities. Left ventricular diastolic parameters are  indeterminate.   2. Right ventricular systolic function is normal. The right ventricular  size is normal. There is mildly elevated pulmonary artery systolic  pressure. The estimated right ventricular systolic pressure is 36.5 mmHg.   3. No evidence of mitral valve regurgitation.  Review of Systems: [y] = yes, [ ]  = no   General: Weight gain [ Y]; Weight loss [ ] ; Anorexia [ ] ; Fatigue [ ] ; Fever [ ] ; Chills [ ] ; Weakness [ ]   Cardiac: Chest pain/pressure [ ] ; Resting SOB [ ] ; Exertional SOB [ Y]; Orthopnea [ ] ; Pedal Edema [ ] ; Palpitations [ ] ; Syncope [ ] ; Presyncope [ ] ; Paroxysmal nocturnal dyspnea[ ]   Pulmonary: Cough [ ] ; Wheezing[ ] ; Hemoptysis[ ] ; Sputum [ ] ; Snoring [ ]   GI: Vomiting[ ] ; Dysphagia[ ] ; Melena[ ] ; Hematochezia [ ] ; Heartburn[ ] ; Abdominal pain [ ] ; Constipation [ ] ; Diarrhea [ ] ; BRBPR [ ]   GU: Hematuria[ ] ; Dysuria [ ] ; Nocturia[ ]   Vascular: Pain in legs with walking [ ] ; Pain in feet with lying flat [ ] ; Non-healing sores [ ] ; Stroke [ ] ; TIA [ ] ;  Slurred speech [ ] ;  Neuro: Headaches[ ] ; Vertigo[ ] ; Seizures[ ] ; Paresthesias[ ] ;Blurred vision [ ] ; Diplopia [ ] ; Vision changes [ ]   Ortho/Skin: Arthritis [ ] ; Joint pain [ Y]; Muscle pain [ ] ; Joint swelling [ ] ; Back Pain [ Y]; Rash [ ]   Psych: Depression[ ] ; Anxiety[ ]   Heme: Bleeding problems [ ] ; Clotting disorders [ ] ; Anemia [ ]   Endocrine: Diabetes [Y ]; Thyroid dysfunction[ ]    Past Medical History:  Diagnosis Date   A-fib (HCC)    Acute hypoxemic respiratory failure (HCC) 02/20/2022   Acute on  chronic hypoxic respiratory failure (HCC) 01/04/2022   Acute upper respiratory infection 01/08/2022   Anxiety    Arthritis    "qwhre; joints, back" (04/17/2017)   Atrial fibrillation (HCC)    Benign breast cyst in female, left 01/08/2017   Found by Screening mammogram, evaluated by U/S on 01/08/17 and determined to be a benign simple breast cyst.   Breast cancer (HCC)    Cellulitis of left lower leg 05/30/2017   CHF (congestive heart failure) (HCC)    Chronic low back pain 08/21/2016   Chronic lower back pain    Chronic venous insufficiency    /notes 05/30/2017   COPD (chronic obstructive pulmonary disease) (HCC)    Depression    Diabetes (HCC) 01/08/2022   Diabetes mellitus without complication (HCC)    DVT (deep venous thrombosis) (HCC) 11/16/2016   Dysrhythmia    Esophageal dysmotility 11/10/2019   Previous workup by ENT with fiberoptic leryngoscopy which was normal. Dx with laryngeal pharyngeal reflux.   GERD (gastroesophageal reflux disease)    Headache    "weekly for the last 3 months" (04/17/2017)   History of pulmonary embolism    Pulmonary embolism   Hyperlipidemia    Hypertension    Laryngopharyngeal reflux 12/18/2015   Morbid obesity (HCC)    PE (pulmonary embolism)    Pulmonary embolism (HCC) 09/21/2014   Sleep apnea    Stroke (HCC) 12/02/2021    Current Outpatient Medications  Medication Sig Dispense Refill   albuterol (VENTOLIN HFA) 108 (90 Base) MCG/ACT inhaler Inhale 2 puffs into the lungs every 4 (four) hours as needed for shortness of breath. 18 g 1   aspirin EC 81 MG tablet Take 81 mg by mouth daily.     atorvastatin (LIPITOR) 80 MG tablet Take 1 tablet (80 mg total) by mouth daily. 100 tablet 2   esomeprazole (NEXIUM) 40 MG capsule Take 1 capsule (40 mg total) by mouth daily as needed (for heartburn or indigestion). (Patient taking differently: Take 40 mg by mouth daily.) 90 capsule 1   Fluticasone-Umeclidin-Vilant (TRELEGY ELLIPTA) 100-62.5-25 MCG/ACT AEPB  Inhale 1 puff into the lungs daily. 60 each 5   letrozole (FEMARA) 2.5 MG tablet TAKE 1 TABLET(2.5 MG) BY MOUTH DAILY 90 tablet 3   LORazepam (ATIVAN) 0.5 MG tablet Take 0.5 mg by mouth daily as needed for anxiety.     losartan (COZAAR) 25 MG tablet Take 1 tablet (25 mg total) by mouth daily. 30 tablet 11   metoprolol succinate (TOPROL-XL) 25 MG 24 hr tablet TAKE 1 TABLET BY MOUTH DAILY 100 tablet 2   naloxone (NARCAN) nasal spray 4 mg/0.1 mL Place 1 spray into the nose once as needed (for overdose suspected). for opioid overdose     oxybutynin (DITROPAN) 5 MG tablet Take 1 tablet (5 mg total) by mouth 3 (three) times daily.     oxyCODONE (ROXICODONE) 15 MG immediate release tablet Take 15  mg by mouth See admin instructions. Take 15mg  (1 tablet) by mouth every morning and 15mg  (1 tablet) at bedtime in addition to twice during the day as needed.     OXYGEN Inhale 2-3 L into the lungs daily.     potassium chloride SA (KLOR-CON M) 20 MEQ tablet Take 1 tablet (20 mEq total) by mouth daily. 90 tablet 0   PRESCRIPTION MEDICATION Inhale into the lungs at bedtime. cpap     rivaroxaban (XARELTO) 20 MG TABS tablet Take 1 tablet (20 mg total) by mouth every morning. 90 tablet 3   Semaglutide-Weight Management (WEGOVY) 1 MG/0.5ML SOAJ Inject 1 mg into the skin once a week. 2 mL 3   spironolactone (ALDACTONE) 25 MG tablet Take 1 tablet (25 mg total) by mouth daily.     torsemide 40 MG TABS Take 40 mg by mouth daily.     No current facility-administered medications for this encounter.    No Known Allergies    Social History   Socioeconomic History   Marital status: Divorced    Spouse name: Not on file   Number of children: Not on file   Years of education: Not on file   Highest education level: Not on file  Occupational History   Not on file  Tobacco Use   Smoking status: Former    Current packs/day: 0.00    Average packs/day: 2.0 packs/day for 60.4 years (120.8 ttl pk-yrs)    Types: Cigarettes     Start date: 08/16/1961    Quit date: 01/02/2022    Years since quitting: 0.8   Smokeless tobacco: Never   Tobacco comments:    Pt smokes about 4 ciggs daily. Stopped 01/02/2022   Vaping Use   Vaping status: Never Used  Substance and Sexual Activity   Alcohol use: No   Drug use: No   Sexual activity: Not Currently    Partners: Male  Other Topics Concern   Not on file  Social History Narrative   Lives in [facility] in [place]. Lives alone, but is dependent in ADLs/IADLs. Previously had HH PT and RN but dismissed them with plans to use the [employer]. Two daughters live nearby. Previously resided in [place].   Social Determinants of Health   Financial Resource Strain: Medium Risk (11/16/2022)   Overall Financial Resource Strain (CARDIA)    Difficulty of Paying Living Expenses: Somewhat hard  Food Insecurity: No Food Insecurity (11/03/2022)   Hunger Vital Sign    Worried About Running Out of Food in the Last Year: Never true    Ran Out of Food in the Last Year: Never true  Transportation Needs: No Transportation Needs (11/03/2022)   PRAPARE - Administrator, Civil Service (Medical): No    Lack of Transportation (Non-Medical): No  Physical Activity: Inactive (11/16/2022)   Exercise Vital Sign    Days of Exercise per Week: 0 days    Minutes of Exercise per Session: 0 min  Stress: No Stress Concern Present (12/02/2021)   [employer] of Occupational Health - Occupational Stress Questionnaire    Feeling of Stress : Only a little  Social Connections: Moderately Integrated (03/15/2022)   Social Connection and Isolation Panel [NHANES]    Frequency of Communication with Friends and Family: More than three times a week    Frequency of Social Gatherings with Friends and Family: More than three times a week    Attends Religious Services: More than 4 times per year  Active Member of Clubs or Organizations: Yes    Attends Banker Meetings: More than 4  times per year    Marital Status: Divorced  Intimate Partner Violence: Not At Risk (11/03/2022)   Humiliation, Afraid, Rape, and Kick questionnaire    Fear of Current or Ex-Partner: No    Emotionally Abused: No    Physically Abused: No    Sexually Abused: No      Family History  Problem Relation Age of Onset   Breast cancer Mother        before age 24   Hypertension Mother    Hyperlipidemia Mother    Hypertension Maternal Grandfather    Hyperlipidemia Maternal Grandfather     Vitals:   11/16/22 0957  BP: 118/82  Pulse: 75  SpO2: 96%  Weight: (!) 157.2 kg (346 lb 8 oz)   Wt Readings from Last 3 Encounters:  11/16/22 (!) 157.2 kg (346 lb 8 oz)  11/05/22 (!) 150.8 kg (332 lb 7.3 oz)  10/05/22 (!) 163.3 kg (360 lb)     PHYSICAL EXAM: General: Arrived in an electric wheel chair. No respiratory difficulty HEENT: normal Neck: supple. JVP 10-11. Carotids 2+ bilat; no bruits. No lymphadenopathy or thryomegaly appreciated. Cor: PMI nondisplaced. Regular rate & rhythm. No rubs, gallops or murmurs. Lungs: clear Abdomen: obese, soft, nontender, nondistended. No hepatosplenomegaly. No bruits or masses. Good bowel sounds. Extremities: no cyanosis, clubbing, rash, R and LLE unna boots.  Neuro: alert & oriented x 3, cranial nerves grossly intact. moves all 4 extremities w/o difficulty. Affect pleasant.  ECG: A fib 86 bpm    ASSESSMENT & PLAN: 1. HFrEF, NICM  NYHA III confounded arthritis/HF/COPD Echo EF < 20%. RV normal. Etiology unclear but possible stress. ECHO in January 2024  EF 60-65%.  Cath with  minimal CAD. CO 8.7 CI 3.2  GDMT  Diuretic- Volume status elevated. Increase torsemide 60 mg daily  BB- Continue Toprol XL 25 mg daily.  Ace/ARB/ARNI- Continue losartan 25 mg daily  MRA- Continue spironolactone 25 mg daily.  SGLT2i-  Start Jardiance 10 mg daily  Discussed low salt food choices and limiting fluid intake to < 2 liters daily.  Repeat ECHO 3 months after HF meds  optimized. Check BMET  2. A fib , chronic  She has been in A fib on EKG dating back to 2022.  Rate controlled. Continue current dose of Toprol XL.  Continue xarelto.  3. Obesity  Body mass index is 44.49 kg/m. On Wegovy   4. Chronic Hypoxic Respiratory Failure on home oxygen.  On 2 liters [place]. Sats stable.    Referred to HFSW (PCP, Medications, Transportation, ETOH Abuse, Drug Abuse, Insurance, Financial ): No Refer to Pharmacy: No Refer to Home Health:  No Refer to [facility]: no  Refer to General Cardiology:Existing patient fo Dr Allyson Sabal. We will set up follow up.   Follow up in 2-3 weeks to reassess and determine if she needs to be referred to HF clinic.     NP-C  3:59 PM

## 2022-11-16 NOTE — Telephone Encounter (Signed)
Decision:Approved Zaryah Ackerly (Key: [id number]) Rx #: [id number] AVWUJW 1MG /0.5ML auto-injectors Form [employer] Medicare Part D Electronic Prior Authorization Form (2017 NCPDP) Created Your prior authorization for Reginal Lutes has been approved! MORE INFO Personalized support and financial assistance may be available through the [employer] program. For more information, and to see program requirements, click on the More Info button to the right.  Message from plan: Request Reference Number: [id number]. WEGOVY INJ 1MG  is approved through 04/03/2023. Your patient may now fill this prescription and it will be covered.. Authorization Expiration Date: April 03, 2023.

## 2022-11-17 ENCOUNTER — Encounter: Payer: Self-pay | Admitting: Student

## 2022-11-17 DIAGNOSIS — Z741 Need for assistance with personal care: Secondary | ICD-10-CM | POA: Diagnosis not present

## 2022-11-17 DIAGNOSIS — R2681 Unsteadiness on feet: Secondary | ICD-10-CM | POA: Diagnosis not present

## 2022-11-17 DIAGNOSIS — J9611 Chronic respiratory failure with hypoxia: Secondary | ICD-10-CM | POA: Diagnosis not present

## 2022-11-17 DIAGNOSIS — Z8639 Personal history of other endocrine, nutritional and metabolic disease: Secondary | ICD-10-CM

## 2022-11-17 DIAGNOSIS — J449 Chronic obstructive pulmonary disease, unspecified: Secondary | ICD-10-CM | POA: Diagnosis not present

## 2022-11-17 DIAGNOSIS — I5022 Chronic systolic (congestive) heart failure: Secondary | ICD-10-CM | POA: Diagnosis not present

## 2022-11-17 DIAGNOSIS — I4891 Unspecified atrial fibrillation: Secondary | ICD-10-CM | POA: Diagnosis not present

## 2022-11-17 DIAGNOSIS — M792 Neuralgia and neuritis, unspecified: Secondary | ICD-10-CM | POA: Insufficient documentation

## 2022-11-17 DIAGNOSIS — M6281 Muscle weakness (generalized): Secondary | ICD-10-CM | POA: Diagnosis not present

## 2022-11-17 DIAGNOSIS — I5023 Acute on chronic systolic (congestive) heart failure: Secondary | ICD-10-CM | POA: Diagnosis not present

## 2022-11-17 HISTORY — DX: Personal history of other endocrine, nutritional and metabolic disease: Z86.39

## 2022-11-17 NOTE — Assessment & Plan Note (Signed)
Hypokalemic in the hospital, BMP 3.9 today (3.3 in the past).

## 2022-11-17 NOTE — Assessment & Plan Note (Signed)
Uses gabapentin 400mg  BID.  -Refill sent

## 2022-11-17 NOTE — Assessment & Plan Note (Signed)
Continue Metoprolol XR 25mg  and Xarelto 20mg  daily.

## 2022-11-17 NOTE — Assessment & Plan Note (Signed)
Start [place], approved today.

## 2022-11-17 NOTE — Assessment & Plan Note (Signed)
Started Jardiance. Reginal Lutes approved today.

## 2022-11-17 NOTE — Assessment & Plan Note (Addendum)
Comes from a follow up from her most recent hospitalization from 7/27-8/5. Developed SOB after a car trip. She was intubated in the ED for severe hypoxemia. Started on pressors for hypotension. A CXR was obtained and was significant cardiomegaly and pulmonary edema.  On 7/31 got an echo which showed LV septum is aneurysmal towards the apex. Left ventricular ejection fraction, by estimation, is <20%  significantly changed from her echo in January showing an EF of 60-65%. On 7/29 she developed a mucus plug, became hypoxic, and then required CPR for inadequate HR as it had dropped in the 30s. The mucus plug was removed and she was extubated 7/30. The patient was back to 2-3L of Oxygen at home.  Since discharge, she saw cardiology today, which increased torsemide to 60mg  3 tablets by mouth daily and started on Jardiance10mg  by mouth daily. Her weight is up 14lbs since two weeks ago. Severe edema on the bilateral lower legs. No rales on auscultation. She is also scared as she does not feel ready to go home and has no assistance living alone, SW is on board.  Cw metoprolol 25mg , spironolactone 25mg , losartan 25mg .  -start meds prescribed by cardiology today (Torsemide increased 60mg  daily and Jardiance 10mg  daily)+ -Continue use of albuterol and trelegy -follow up with cardiology  -follow up with social work

## 2022-11-18 DIAGNOSIS — J449 Chronic obstructive pulmonary disease, unspecified: Secondary | ICD-10-CM | POA: Diagnosis not present

## 2022-11-20 DIAGNOSIS — Z79899 Other long term (current) drug therapy: Secondary | ICD-10-CM | POA: Diagnosis not present

## 2022-11-20 DIAGNOSIS — G4733 Obstructive sleep apnea (adult) (pediatric): Secondary | ICD-10-CM | POA: Diagnosis not present

## 2022-11-20 DIAGNOSIS — E119 Type 2 diabetes mellitus without complications: Secondary | ICD-10-CM | POA: Diagnosis not present

## 2022-11-20 DIAGNOSIS — R5383 Other fatigue: Secondary | ICD-10-CM | POA: Diagnosis not present

## 2022-11-20 DIAGNOSIS — R2681 Unsteadiness on feet: Secondary | ICD-10-CM | POA: Diagnosis not present

## 2022-11-20 DIAGNOSIS — G8929 Other chronic pain: Secondary | ICD-10-CM | POA: Diagnosis not present

## 2022-11-20 DIAGNOSIS — R030 Elevated blood-pressure reading, without diagnosis of hypertension: Secondary | ICD-10-CM | POA: Diagnosis not present

## 2022-11-20 DIAGNOSIS — Z0189 Encounter for other specified special examinations: Secondary | ICD-10-CM | POA: Diagnosis not present

## 2022-11-20 DIAGNOSIS — I10 Essential (primary) hypertension: Secondary | ICD-10-CM | POA: Diagnosis not present

## 2022-11-20 DIAGNOSIS — M170 Bilateral primary osteoarthritis of knee: Secondary | ICD-10-CM | POA: Diagnosis not present

## 2022-11-20 DIAGNOSIS — M25562 Pain in left knee: Secondary | ICD-10-CM | POA: Diagnosis not present

## 2022-11-20 DIAGNOSIS — I509 Heart failure, unspecified: Secondary | ICD-10-CM | POA: Diagnosis not present

## 2022-11-20 DIAGNOSIS — E7800 Pure hypercholesterolemia, unspecified: Secondary | ICD-10-CM | POA: Diagnosis not present

## 2022-11-20 DIAGNOSIS — J449 Chronic obstructive pulmonary disease, unspecified: Secondary | ICD-10-CM | POA: Diagnosis not present

## 2022-11-20 DIAGNOSIS — J439 Emphysema, unspecified: Secondary | ICD-10-CM | POA: Diagnosis not present

## 2022-11-20 DIAGNOSIS — J9611 Chronic respiratory failure with hypoxia: Secondary | ICD-10-CM | POA: Diagnosis not present

## 2022-11-20 DIAGNOSIS — M25561 Pain in right knee: Secondary | ICD-10-CM | POA: Diagnosis not present

## 2022-11-20 DIAGNOSIS — E559 Vitamin D deficiency, unspecified: Secondary | ICD-10-CM | POA: Diagnosis not present

## 2022-11-20 DIAGNOSIS — J9601 Acute respiratory failure with hypoxia: Secondary | ICD-10-CM | POA: Diagnosis not present

## 2022-11-21 ENCOUNTER — Telehealth: Payer: Self-pay

## 2022-11-21 ENCOUNTER — Telehealth: Payer: Self-pay | Admitting: *Deleted

## 2022-11-21 DIAGNOSIS — Z79891 Long term (current) use of opiate analgesic: Secondary | ICD-10-CM | POA: Diagnosis not present

## 2022-11-21 DIAGNOSIS — G4733 Obstructive sleep apnea (adult) (pediatric): Secondary | ICD-10-CM | POA: Diagnosis not present

## 2022-11-21 DIAGNOSIS — I4891 Unspecified atrial fibrillation: Secondary | ICD-10-CM | POA: Diagnosis not present

## 2022-11-21 DIAGNOSIS — I272 Pulmonary hypertension, unspecified: Secondary | ICD-10-CM | POA: Diagnosis not present

## 2022-11-21 DIAGNOSIS — K5900 Constipation, unspecified: Secondary | ICD-10-CM | POA: Diagnosis not present

## 2022-11-21 DIAGNOSIS — G890 Central pain syndrome: Secondary | ICD-10-CM | POA: Diagnosis not present

## 2022-11-21 DIAGNOSIS — J449 Chronic obstructive pulmonary disease, unspecified: Secondary | ICD-10-CM | POA: Diagnosis not present

## 2022-11-21 DIAGNOSIS — E785 Hyperlipidemia, unspecified: Secondary | ICD-10-CM | POA: Diagnosis not present

## 2022-11-21 DIAGNOSIS — K219 Gastro-esophageal reflux disease without esophagitis: Secondary | ICD-10-CM | POA: Diagnosis not present

## 2022-11-21 DIAGNOSIS — M810 Age-related osteoporosis without current pathological fracture: Secondary | ICD-10-CM | POA: Diagnosis not present

## 2022-11-21 DIAGNOSIS — J9622 Acute and chronic respiratory failure with hypercapnia: Secondary | ICD-10-CM | POA: Diagnosis not present

## 2022-11-21 DIAGNOSIS — I700 Atherosclerosis of aorta: Secondary | ICD-10-CM | POA: Diagnosis not present

## 2022-11-21 DIAGNOSIS — I2510 Atherosclerotic heart disease of native coronary artery without angina pectoris: Secondary | ICD-10-CM | POA: Diagnosis not present

## 2022-11-21 DIAGNOSIS — Z7901 Long term (current) use of anticoagulants: Secondary | ICD-10-CM | POA: Diagnosis not present

## 2022-11-21 DIAGNOSIS — I5023 Acute on chronic systolic (congestive) heart failure: Secondary | ICD-10-CM | POA: Diagnosis not present

## 2022-11-21 DIAGNOSIS — Z7985 Long-term (current) use of injectable non-insulin antidiabetic drugs: Secondary | ICD-10-CM | POA: Diagnosis not present

## 2022-11-21 DIAGNOSIS — I959 Hypotension, unspecified: Secondary | ICD-10-CM | POA: Diagnosis not present

## 2022-11-21 DIAGNOSIS — J9621 Acute and chronic respiratory failure with hypoxia: Secondary | ICD-10-CM | POA: Diagnosis not present

## 2022-11-21 DIAGNOSIS — E119 Type 2 diabetes mellitus without complications: Secondary | ICD-10-CM | POA: Diagnosis not present

## 2022-11-21 NOTE — Telephone Encounter (Signed)
[facility] for [facility] 2 times a week for 4 weeks and 1 time a week for 4 weeks.  Mobility, Strengthening, and Location manager.  Requesting a Nurse for Medication management.  Patient has a Stage 1 pressure on her tailbone.  [employer]  SW discuss [employer]. Nurse Aide for bathing 1 time a week.   Verbal approval was given.  Will forward to PCP for approval or denial.

## 2022-11-21 NOTE — Patient Outreach (Signed)
  Care Coordination   Follow Up Visit Note   11/21/2022 Name: Sharon Vasquez [name]: [id number] DOB: [birth year]  Sharon Vasquez is a 72 y.o. year old female who sees Atway, Rayann N, DO for primary care. I spoke with  [name] by phone today.  What matters to the patients health and wellness today?  Patient was discharged from SNF when she did not feel ready.    Goals Addressed               This Visit's Progress     COMPLETED: I am at [facility] and they are discharging me (pt-stated)        Care Coordination Interventions: Patient was discharged from [facility] and she is having difficulty at home alone. Scheduled call from Bevelyn Ngo, BSN to call and talk to patient about placement vs help at home.          SDOH assessments and interventions completed:  Yes     Care Coordination Interventions:  Yes, provided   Follow up plan: Referral made to Bevelyn Ngo, BSN    Encounter Outcome:  Pt. Visit Completed

## 2022-11-23 DIAGNOSIS — J449 Chronic obstructive pulmonary disease, unspecified: Secondary | ICD-10-CM | POA: Diagnosis not present

## 2022-11-23 DIAGNOSIS — I2510 Atherosclerotic heart disease of native coronary artery without angina pectoris: Secondary | ICD-10-CM | POA: Diagnosis not present

## 2022-11-23 DIAGNOSIS — I4891 Unspecified atrial fibrillation: Secondary | ICD-10-CM | POA: Diagnosis not present

## 2022-11-23 DIAGNOSIS — M810 Age-related osteoporosis without current pathological fracture: Secondary | ICD-10-CM | POA: Diagnosis not present

## 2022-11-23 DIAGNOSIS — Z79899 Other long term (current) drug therapy: Secondary | ICD-10-CM | POA: Diagnosis not present

## 2022-11-23 DIAGNOSIS — J9621 Acute and chronic respiratory failure with hypoxia: Secondary | ICD-10-CM | POA: Diagnosis not present

## 2022-11-23 DIAGNOSIS — G4733 Obstructive sleep apnea (adult) (pediatric): Secondary | ICD-10-CM | POA: Diagnosis not present

## 2022-11-23 DIAGNOSIS — I5023 Acute on chronic systolic (congestive) heart failure: Secondary | ICD-10-CM | POA: Diagnosis not present

## 2022-11-23 DIAGNOSIS — G890 Central pain syndrome: Secondary | ICD-10-CM | POA: Diagnosis not present

## 2022-11-23 DIAGNOSIS — K219 Gastro-esophageal reflux disease without esophagitis: Secondary | ICD-10-CM | POA: Diagnosis not present

## 2022-11-23 DIAGNOSIS — K5900 Constipation, unspecified: Secondary | ICD-10-CM | POA: Diagnosis not present

## 2022-11-23 DIAGNOSIS — Z79891 Long term (current) use of opiate analgesic: Secondary | ICD-10-CM | POA: Diagnosis not present

## 2022-11-23 DIAGNOSIS — I700 Atherosclerosis of aorta: Secondary | ICD-10-CM | POA: Diagnosis not present

## 2022-11-23 DIAGNOSIS — J9622 Acute and chronic respiratory failure with hypercapnia: Secondary | ICD-10-CM | POA: Diagnosis not present

## 2022-11-23 DIAGNOSIS — I959 Hypotension, unspecified: Secondary | ICD-10-CM | POA: Diagnosis not present

## 2022-11-23 DIAGNOSIS — E119 Type 2 diabetes mellitus without complications: Secondary | ICD-10-CM | POA: Diagnosis not present

## 2022-11-23 DIAGNOSIS — Z7985 Long-term (current) use of injectable non-insulin antidiabetic drugs: Secondary | ICD-10-CM | POA: Diagnosis not present

## 2022-11-23 DIAGNOSIS — E785 Hyperlipidemia, unspecified: Secondary | ICD-10-CM | POA: Diagnosis not present

## 2022-11-23 DIAGNOSIS — I272 Pulmonary hypertension, unspecified: Secondary | ICD-10-CM | POA: Diagnosis not present

## 2022-11-23 DIAGNOSIS — Z7901 Long term (current) use of anticoagulants: Secondary | ICD-10-CM | POA: Diagnosis not present

## 2022-11-24 ENCOUNTER — Ambulatory Visit: Payer: Self-pay

## 2022-11-24 DIAGNOSIS — J449 Chronic obstructive pulmonary disease, unspecified: Secondary | ICD-10-CM | POA: Diagnosis not present

## 2022-11-24 DIAGNOSIS — K5900 Constipation, unspecified: Secondary | ICD-10-CM | POA: Diagnosis not present

## 2022-11-24 DIAGNOSIS — Z7985 Long-term (current) use of injectable non-insulin antidiabetic drugs: Secondary | ICD-10-CM | POA: Diagnosis not present

## 2022-11-24 DIAGNOSIS — I700 Atherosclerosis of aorta: Secondary | ICD-10-CM | POA: Diagnosis not present

## 2022-11-24 DIAGNOSIS — E785 Hyperlipidemia, unspecified: Secondary | ICD-10-CM | POA: Diagnosis not present

## 2022-11-24 DIAGNOSIS — Z79891 Long term (current) use of opiate analgesic: Secondary | ICD-10-CM | POA: Diagnosis not present

## 2022-11-24 DIAGNOSIS — J9621 Acute and chronic respiratory failure with hypoxia: Secondary | ICD-10-CM | POA: Diagnosis not present

## 2022-11-24 DIAGNOSIS — G4733 Obstructive sleep apnea (adult) (pediatric): Secondary | ICD-10-CM | POA: Diagnosis not present

## 2022-11-24 DIAGNOSIS — I5023 Acute on chronic systolic (congestive) heart failure: Secondary | ICD-10-CM | POA: Diagnosis not present

## 2022-11-24 DIAGNOSIS — I272 Pulmonary hypertension, unspecified: Secondary | ICD-10-CM | POA: Diagnosis not present

## 2022-11-24 DIAGNOSIS — K219 Gastro-esophageal reflux disease without esophagitis: Secondary | ICD-10-CM | POA: Diagnosis not present

## 2022-11-24 DIAGNOSIS — J9622 Acute and chronic respiratory failure with hypercapnia: Secondary | ICD-10-CM | POA: Diagnosis not present

## 2022-11-24 DIAGNOSIS — E119 Type 2 diabetes mellitus without complications: Secondary | ICD-10-CM | POA: Diagnosis not present

## 2022-11-24 DIAGNOSIS — I4891 Unspecified atrial fibrillation: Secondary | ICD-10-CM | POA: Diagnosis not present

## 2022-11-24 DIAGNOSIS — G890 Central pain syndrome: Secondary | ICD-10-CM | POA: Diagnosis not present

## 2022-11-24 DIAGNOSIS — Z7901 Long term (current) use of anticoagulants: Secondary | ICD-10-CM | POA: Diagnosis not present

## 2022-11-24 DIAGNOSIS — I959 Hypotension, unspecified: Secondary | ICD-10-CM | POA: Diagnosis not present

## 2022-11-24 DIAGNOSIS — I2510 Atherosclerotic heart disease of native coronary artery without angina pectoris: Secondary | ICD-10-CM | POA: Diagnosis not present

## 2022-11-24 DIAGNOSIS — M810 Age-related osteoporosis without current pathological fracture: Secondary | ICD-10-CM | POA: Diagnosis not present

## 2022-11-24 NOTE — Patient Instructions (Signed)
Visit Information  Thank you for taking time to visit with me today. Please don't hesitate to contact me if I can be of assistance to you.   Following are the goals we discussed today:  - Speak with home health nurse with medication questions - Contact your primary care provider as needed   Our next appointment is by telephone on 8/30 at 1:30  Please call the care guide team at [phone] if you need to cancel or reschedule your appointment.   If you are experiencing a Mental Health or Behavioral Health Crisis or need someone to talk to, please call 1-800-273-TALK (toll free, 24 hour hotline) go to [facility] [address], [place] [phone]) call 911  Patient verbalizes understanding of instructions and care plan provided today and agrees to view in MyChart. Active MyChart status and patient understanding of how to access instructions and care plan via MyChart confirmed with patient.     Bevelyn Ngo, BSW, CDP Social Worker, Certified Dementia Practitioner [facility] Care Management  Care Coordination [phone]

## 2022-11-24 NOTE — Progress Notes (Signed)
Internal Medicine Clinic Attending  I was physically present during the key portions of the resident provided service and participated in the medical decision making of patient's management care. I reviewed pertinent patient test results.  The assessment, diagnosis, and plan were formulated together and I agree with the documentation in the resident's note.  Mercie Eon, MD    Patient here for hospital follow up. She also saw cardiology earlier today. She is volume up. I agree with Cardiology's plan to increase Torsemide to 60mg  daily.  At 1 month follow up, please check weight & BMP if it hasn't been checked again

## 2022-11-24 NOTE — Patient Outreach (Signed)
  Care Coordination   Follow Up Visit Note   11/24/2022 Name: Sharon Vasquez [name]: [id number] DOB: 1951/01/19  Sharon Vasquez is a 72 y.o. year old female who sees Atway, Rayann N, DO for primary care. I spoke with  [name] by phone today.  What matters to the patients health and wellness today?  Patient has concerns with medication adherence and diet    Goals Addressed             This Visit's Progress    Care Coordination Activities       Care Coordination Interventions: Discussed the patient is concerns with inability to weigh self daily as her scale is broken and she does not have funds to purchase another one this month - advised SW will research options for obtaining a scale Determined the patient has concerns with medication adherence due to inability to find a pharmacy that has Wegovy in stock. Advised the patient this medication is often hard to find Patient reports she is not currently taking her blood pressure medication as she feels her blood pressure is currently on the low end and she is concerned it will drop too low. The patient plans to speak with her home health nurse about this during their next home visit scheduled for Monday 8/26 Discussed the patient does not cook on the stove anymore due to concerns with her hands shaking; patient only uses the microwave. She was advised by her home health therapist the food options she is using are too high in sodium. Patient will try to be more mindful of sodium levels during her next shopping trip Referral to RN Care Manager to assist with disease management         SDOH assessments and interventions completed:  No     Care Coordination Interventions:  Yes, provided   Interventions Today    Flowsheet Row Most Recent Value  Chronic Disease   Chronic disease during today's visit Hypertension (HTN)  General Interventions   General Interventions Discussed/Reviewed General Interventions Discussed, Referral to Nurse         Follow up plan: Referral made to RN Care Manager Follow up call scheduled for 8/30    Encounter Outcome:  Pt. Visit Completed   Bevelyn Ngo, Kenard Gower, CDP Social Worker, Certified Dementia Practitioner [facility] Care Management  Care Coordination [phone]

## 2022-11-27 DIAGNOSIS — I2510 Atherosclerotic heart disease of native coronary artery without angina pectoris: Secondary | ICD-10-CM | POA: Diagnosis not present

## 2022-11-27 DIAGNOSIS — I272 Pulmonary hypertension, unspecified: Secondary | ICD-10-CM | POA: Diagnosis not present

## 2022-11-27 DIAGNOSIS — Z79891 Long term (current) use of opiate analgesic: Secondary | ICD-10-CM | POA: Diagnosis not present

## 2022-11-27 DIAGNOSIS — Z7901 Long term (current) use of anticoagulants: Secondary | ICD-10-CM | POA: Diagnosis not present

## 2022-11-27 DIAGNOSIS — M810 Age-related osteoporosis without current pathological fracture: Secondary | ICD-10-CM | POA: Diagnosis not present

## 2022-11-27 DIAGNOSIS — K219 Gastro-esophageal reflux disease without esophagitis: Secondary | ICD-10-CM | POA: Diagnosis not present

## 2022-11-27 DIAGNOSIS — I4891 Unspecified atrial fibrillation: Secondary | ICD-10-CM | POA: Diagnosis not present

## 2022-11-27 DIAGNOSIS — I700 Atherosclerosis of aorta: Secondary | ICD-10-CM | POA: Diagnosis not present

## 2022-11-27 DIAGNOSIS — J9622 Acute and chronic respiratory failure with hypercapnia: Secondary | ICD-10-CM | POA: Diagnosis not present

## 2022-11-27 DIAGNOSIS — E119 Type 2 diabetes mellitus without complications: Secondary | ICD-10-CM | POA: Diagnosis not present

## 2022-11-27 DIAGNOSIS — I5023 Acute on chronic systolic (congestive) heart failure: Secondary | ICD-10-CM | POA: Diagnosis not present

## 2022-11-27 DIAGNOSIS — E785 Hyperlipidemia, unspecified: Secondary | ICD-10-CM | POA: Diagnosis not present

## 2022-11-27 DIAGNOSIS — J9621 Acute and chronic respiratory failure with hypoxia: Secondary | ICD-10-CM | POA: Diagnosis not present

## 2022-11-27 DIAGNOSIS — J449 Chronic obstructive pulmonary disease, unspecified: Secondary | ICD-10-CM | POA: Diagnosis not present

## 2022-11-27 DIAGNOSIS — K5900 Constipation, unspecified: Secondary | ICD-10-CM | POA: Diagnosis not present

## 2022-11-27 DIAGNOSIS — Z7985 Long-term (current) use of injectable non-insulin antidiabetic drugs: Secondary | ICD-10-CM | POA: Diagnosis not present

## 2022-11-27 DIAGNOSIS — G890 Central pain syndrome: Secondary | ICD-10-CM | POA: Diagnosis not present

## 2022-11-27 DIAGNOSIS — I959 Hypotension, unspecified: Secondary | ICD-10-CM | POA: Diagnosis not present

## 2022-11-27 DIAGNOSIS — G4733 Obstructive sleep apnea (adult) (pediatric): Secondary | ICD-10-CM | POA: Diagnosis not present

## 2022-11-28 DIAGNOSIS — G890 Central pain syndrome: Secondary | ICD-10-CM | POA: Diagnosis not present

## 2022-11-28 DIAGNOSIS — I5023 Acute on chronic systolic (congestive) heart failure: Secondary | ICD-10-CM | POA: Diagnosis not present

## 2022-11-28 DIAGNOSIS — I700 Atherosclerosis of aorta: Secondary | ICD-10-CM | POA: Diagnosis not present

## 2022-11-28 DIAGNOSIS — Z79891 Long term (current) use of opiate analgesic: Secondary | ICD-10-CM | POA: Diagnosis not present

## 2022-11-28 DIAGNOSIS — K219 Gastro-esophageal reflux disease without esophagitis: Secondary | ICD-10-CM | POA: Diagnosis not present

## 2022-11-28 DIAGNOSIS — J9621 Acute and chronic respiratory failure with hypoxia: Secondary | ICD-10-CM | POA: Diagnosis not present

## 2022-11-28 DIAGNOSIS — M810 Age-related osteoporosis without current pathological fracture: Secondary | ICD-10-CM | POA: Diagnosis not present

## 2022-11-28 DIAGNOSIS — I4891 Unspecified atrial fibrillation: Secondary | ICD-10-CM | POA: Diagnosis not present

## 2022-11-28 DIAGNOSIS — E785 Hyperlipidemia, unspecified: Secondary | ICD-10-CM | POA: Diagnosis not present

## 2022-11-28 DIAGNOSIS — J449 Chronic obstructive pulmonary disease, unspecified: Secondary | ICD-10-CM | POA: Diagnosis not present

## 2022-11-28 DIAGNOSIS — I959 Hypotension, unspecified: Secondary | ICD-10-CM | POA: Diagnosis not present

## 2022-11-28 DIAGNOSIS — I272 Pulmonary hypertension, unspecified: Secondary | ICD-10-CM | POA: Diagnosis not present

## 2022-11-28 DIAGNOSIS — Z7985 Long-term (current) use of injectable non-insulin antidiabetic drugs: Secondary | ICD-10-CM | POA: Diagnosis not present

## 2022-11-28 DIAGNOSIS — E119 Type 2 diabetes mellitus without complications: Secondary | ICD-10-CM | POA: Diagnosis not present

## 2022-11-28 DIAGNOSIS — I2510 Atherosclerotic heart disease of native coronary artery without angina pectoris: Secondary | ICD-10-CM | POA: Diagnosis not present

## 2022-11-28 DIAGNOSIS — K5900 Constipation, unspecified: Secondary | ICD-10-CM | POA: Diagnosis not present

## 2022-11-28 DIAGNOSIS — Z7901 Long term (current) use of anticoagulants: Secondary | ICD-10-CM | POA: Diagnosis not present

## 2022-11-28 DIAGNOSIS — J9622 Acute and chronic respiratory failure with hypercapnia: Secondary | ICD-10-CM | POA: Diagnosis not present

## 2022-11-28 DIAGNOSIS — G4733 Obstructive sleep apnea (adult) (pediatric): Secondary | ICD-10-CM | POA: Diagnosis not present

## 2022-11-28 NOTE — Progress Notes (Signed)
CC: Hospital follow up  HPI:  Sharon Vasquez is a 72 y.o. with medical history of HTN, HLD, DMII, Class III obesity, paroxysmal atrial fibrillation presenting to [facility] for hospital follow up/medication refills. She was seen about 2 weeks ago for hospital follow up and recommended to follow up in one month. She has a follow up scheduled for 12/13/22,   Please see problem-based list for further details, assessments, and plans.  Past Medical History:  Diagnosis Date   A-fib (HCC)    Acute hypoxemic respiratory failure (HCC) 02/20/2022   Acute on chronic hypoxic respiratory failure (HCC) 01/04/2022   Acute upper respiratory infection 01/08/2022   Anxiety    Arthritis    "qwhre; joints, back" (04/17/2017)   Atrial fibrillation (HCC)    Benign breast cyst in female, left 01/08/2017   Found by Screening mammogram, evaluated by U/S on 01/08/17 and determined to be a benign simple breast cyst.   Breast cancer (HCC)    Cellulitis of left lower leg 05/30/2017   CHF (congestive heart failure) (HCC)    Chronic low back pain 08/21/2016   Chronic lower back pain    Chronic venous insufficiency    /notes 05/30/2017   COPD (chronic obstructive pulmonary disease) (HCC)    Depression    Diabetes (HCC) 01/08/2022   Diabetes mellitus without complication (HCC)    DVT (deep venous thrombosis) (HCC) 11/16/2016   Dysrhythmia    Esophageal dysmotility 11/10/2019   Previous workup by ENT with fiberoptic leryngoscopy which was normal. Dx with laryngeal pharyngeal reflux.   GERD (gastroesophageal reflux disease)    Headache    "weekly for the last 3 months" (04/17/2017)   History of pulmonary embolism    Pulmonary embolism   Hyperlipidemia    Hypertension    Laryngopharyngeal reflux 12/18/2015   Morbid obesity (HCC)    PE (pulmonary embolism)    Pulmonary embolism (HCC) 09/21/2014   Sleep apnea    Stroke (HCC) 12/02/2021    Current Outpatient Medications (Endocrine & Metabolic):     empagliflozin (JARDIANCE) 10 MG TABS tablet, Take 1 tablet (10 mg total) by mouth daily before breakfast.  Current Outpatient Medications (Cardiovascular):    atorvastatin (LIPITOR) 80 MG tablet, Take 1 tablet (80 mg total) by mouth daily.   losartan (COZAAR) 25 MG tablet, Take 1 tablet (25 mg total) by mouth daily.   metoprolol succinate (TOPROL-XL) 25 MG 24 hr tablet, TAKE 1 TABLET BY MOUTH DAILY   spironolactone (ALDACTONE) 25 MG tablet, Take 1 tablet (25 mg total) by mouth daily.   torsemide (DEMADEX) 20 MG tablet, Take 3 tablets (60 mg total) by mouth daily.  Current Outpatient Medications (Respiratory):    albuterol (VENTOLIN HFA) 108 (90 Base) MCG/ACT inhaler, Inhale 2 puffs into the lungs every 4 (four) hours as needed for shortness of breath.   Fluticasone-Umeclidin-Vilant (TRELEGY ELLIPTA) 100-62.5-25 MCG/ACT AEPB, Inhale 1 puff into the lungs daily.  Current Outpatient Medications (Analgesics):    aspirin EC 81 MG tablet, Take 81 mg by mouth daily.   oxyCODONE (ROXICODONE) 15 MG immediate release tablet, Take 15 mg by mouth See admin instructions. Take 15mg  (1 tablet) by mouth every morning and 15mg  (1 tablet) at bedtime in addition to twice during the day as needed.  Current Outpatient Medications (Hematological):    rivaroxaban (XARELTO) 20 MG TABS tablet, Take 1 tablet (20 mg total) by mouth every morning.  Current Outpatient Medications (Other):    esomeprazole (NEXIUM) 40 MG capsule, Take 1  capsule (40 mg total) by mouth daily as needed (for heartburn or indigestion). (Patient taking differently: Take 40 mg by mouth daily.)   gabapentin (NEURONTIN) 400 MG capsule, Take 1 capsule (400 mg total) by mouth 2 (two) times daily.   letrozole (FEMARA) 2.5 MG tablet, TAKE 1 TABLET(2.5 MG) BY MOUTH DAILY   LORazepam (ATIVAN) 0.5 MG tablet, Take 0.5 mg by mouth daily as needed for anxiety.   naloxone (NARCAN) nasal spray 4 mg/0.1 mL, Place 1 spray into the nose once as needed (for  overdose suspected). for opioid overdose   oxybutynin (DITROPAN) 5 MG tablet, Take 1 tablet (5 mg total) by mouth 3 (three) times daily.   OXYGEN, Inhale 2-3 L into the lungs daily.   potassium chloride SA (KLOR-CON M) 20 MEQ tablet, Take 1 tablet (20 mEq total) by mouth daily.   PRESCRIPTION MEDICATION, Inhale into the lungs at bedtime. cpap   Semaglutide-Weight Management (WEGOVY) 1 MG/0.5ML SOAJ, Inject 1 mg into the skin once a week.  Review of Systems:  Review of system negative unless stated in the problem list or HPI.    Physical Exam:  Vitals:   11/29/22 1434  BP: 116/86  Pulse: 93  Resp: 20  Temp: 98.2 F (36.8 C)  TempSrc: Oral  SpO2: 95%  Weight: (!) 338 lb 12.8 oz (153.7 kg)  Height: 6\' 2"  (1.88 m)   Physical Exam General: NAD HENT: NCAT, [place] in place at 2 L Lungs: CTAB, no wheeze, rhonchi or rales.  Cardiovascular: IRIR, good pulses, no lower extremity pitting edema Abdomen: No TTP, normal bowel sounds MSK: No asymmetry or muscle atrophy.  Skin: no lesions noted on exposed skin Neuro: Alert and oriented x4. CN grossly intact Psych: Normal mood and normal affect   Assessment & Plan:   Heart failure with reduced ejection fraction (HCC) Pt is euvolemic on exam today. Weight at 338 lbs. Current regimen includes lasix 60 mg, spirinolactone 25 mg every day, Jardiance 10 mg daily, metoprolol 25 mg every day. Her torsemide was recently increased to 60 mg from 40 mg every day. Repeat BMP shows creatinine at 0.96. K is low at 2.7. Pt is on supplementation and will ensure if she is taking it currently and if she is will have her take 40 mEq for 4 days and repeat BMP next week. She may need up-titration of this given increase in the torsemide. Magnesium was checked and it was normal at 1.9. Cardiology recommends repeat echo in 3 months. Will ensure she has this scheduled at follow up appointment. She has a follow up with [facility] on 09/11.  Acute on chronic respiratory failure  (HCC) Pt admitted for acute on chronic hypoxic respiratory failure secondary to volume overload. Her oxygen requirement is at baseline 2 L. Discussed all medications and their importance to her health. She has trelegy inhaler and is using it daily.   Benign essential HTN Pt's blood pressure is well controlled. It is 116/86 today. Regimen includes torsemide 60 mg every day, losartan 25 mg every day, Spironolactone 25 mg every day, toprol 25 mg every day, Jardiance 10 mg every day. Pt's daughter reported readings in the low 100s/60s. Advised this is normal blood pressure. Gave precautions to hold losartan if BP is <100/60 and call our clinic given increase in her torsemide. Pt is checking her blood pressure with a wrist cuff. I advised them to obtain a brachial blood pressure monitor given inaccuracies with wrist monitors. Will follow up on this 09/11.  Morbid obesity (HCC) Will message our pharmacy technician to assist pt in getting her [facility]. This appears to have been approved previously but pt still does not have it. If I need to re-prescribe it, I will send it again.    See Encounters Tab for problem based charting.  Patient Discussed with Dr. Rubie Maid, MD Eligha Bridegroom. [facility] Internal Medicine Residency, PGY-3

## 2022-11-29 ENCOUNTER — Other Ambulatory Visit: Payer: Self-pay

## 2022-11-29 ENCOUNTER — Encounter: Payer: Self-pay | Admitting: Primary Care

## 2022-11-29 ENCOUNTER — Ambulatory Visit: Payer: 59 | Admitting: Primary Care

## 2022-11-29 ENCOUNTER — Ambulatory Visit (INDEPENDENT_AMBULATORY_CARE_PROVIDER_SITE_OTHER): Payer: 59 | Admitting: Internal Medicine

## 2022-11-29 VITALS — BP 104/78 | HR 85 | Temp 98.3°F | Ht 74.0 in | Wt 346.0 lb

## 2022-11-29 VITALS — BP 116/86 | HR 93 | Temp 98.2°F | Resp 20 | Ht 74.0 in | Wt 338.8 lb

## 2022-11-29 DIAGNOSIS — M170 Bilateral primary osteoarthritis of knee: Secondary | ICD-10-CM | POA: Diagnosis not present

## 2022-11-29 DIAGNOSIS — G4733 Obstructive sleep apnea (adult) (pediatric): Secondary | ICD-10-CM | POA: Diagnosis not present

## 2022-11-29 DIAGNOSIS — I110 Hypertensive heart disease with heart failure: Secondary | ICD-10-CM | POA: Diagnosis not present

## 2022-11-29 DIAGNOSIS — J9601 Acute respiratory failure with hypoxia: Secondary | ICD-10-CM | POA: Diagnosis not present

## 2022-11-29 DIAGNOSIS — J441 Chronic obstructive pulmonary disease with (acute) exacerbation: Secondary | ICD-10-CM

## 2022-11-29 DIAGNOSIS — J9621 Acute and chronic respiratory failure with hypoxia: Secondary | ICD-10-CM | POA: Diagnosis not present

## 2022-11-29 DIAGNOSIS — I5020 Unspecified systolic (congestive) heart failure: Secondary | ICD-10-CM

## 2022-11-29 DIAGNOSIS — Z6841 Body Mass Index (BMI) 40.0 and over, adult: Secondary | ICD-10-CM

## 2022-11-29 DIAGNOSIS — I10 Essential (primary) hypertension: Secondary | ICD-10-CM

## 2022-11-29 DIAGNOSIS — R2681 Unsteadiness on feet: Secondary | ICD-10-CM | POA: Diagnosis not present

## 2022-11-29 DIAGNOSIS — I5032 Chronic diastolic (congestive) heart failure: Secondary | ICD-10-CM

## 2022-11-29 DIAGNOSIS — J9611 Chronic respiratory failure with hypoxia: Secondary | ICD-10-CM

## 2022-11-29 NOTE — Patient Instructions (Addendum)
Ms.Gwendolin D Nolton, it was a pleasure seeing you today! You endorsed feeling well today. Below are some of the things we talked about this visit. We look forward to seeing you in the follow up appointment!  Today we discussed: Please continue taking your medicines every day.  I will follow up on the wegovy and see if that can be refilled. I will also get blood work today.   Please follow up with your heart doctor.   Hold your blood pressure medication losartan if blood pressure is lower than 100/60. Limit your fluid intake to 2 L per day.   Current Outpatient Medications (Endocrine & Metabolic):    empagliflozin (JARDIANCE) 10 MG TABS tablet, Take 1 tablet (10 mg total) by mouth daily before breakfast.  Current Outpatient Medications (Cardiovascular):    atorvastatin (LIPITOR) 80 MG tablet, Take 1 tablet (80 mg total) by mouth daily.   losartan (COZAAR) 25 MG tablet, Take 1 tablet (25 mg total) by mouth daily.   metoprolol succinate (TOPROL-XL) 25 MG 24 hr tablet, TAKE 1 TABLET BY MOUTH DAILY   spironolactone (ALDACTONE) 25 MG tablet, Take 1 tablet (25 mg total) by mouth daily.   torsemide (DEMADEX) 20 MG tablet, Take 3 tablets (60 mg total) by mouth daily.  Current Outpatient Medications (Respiratory):    albuterol (VENTOLIN HFA) 108 (90 Base) MCG/ACT inhaler, Inhale 2 puffs into the lungs every 4 (four) hours as needed for shortness of breath.   Fluticasone-Umeclidin-Vilant (TRELEGY ELLIPTA) 100-62.5-25 MCG/ACT AEPB, Inhale 1 puff into the lungs daily.  Current Outpatient Medications (Analgesics):    aspirin EC 81 MG tablet, Take 81 mg by mouth daily.   oxyCODONE (ROXICODONE) 15 MG immediate release tablet, Take 15 mg by mouth See admin instructions. Take 15mg  (1 tablet) by mouth every morning and 15mg  (1 tablet) at bedtime in addition to twice during the day as needed.  Current Outpatient Medications (Hematological):    rivaroxaban (XARELTO) 20 MG TABS tablet, Take 1 tablet (20 mg  total) by mouth every morning.  Current Outpatient Medications (Other):    esomeprazole (NEXIUM) 40 MG capsule, Take 1 capsule (40 mg total) by mouth daily as needed (for heartburn or indigestion). (Patient taking differently: Take 40 mg by mouth daily.)   gabapentin (NEURONTIN) 400 MG capsule, Take 1 capsule (400 mg total) by mouth 2 (two) times daily.   letrozole (FEMARA) 2.5 MG tablet, TAKE 1 TABLET(2.5 MG) BY MOUTH DAILY   LORazepam (ATIVAN) 0.5 MG tablet, Take 0.5 mg by mouth daily as needed for anxiety.   naloxone (NARCAN) nasal spray 4 mg/0.1 mL, Place 1 spray into the nose once as needed (for overdose suspected). for opioid overdose   oxybutynin (DITROPAN) 5 MG tablet, Take 1 tablet (5 mg total) by mouth 3 (three) times daily.   OXYGEN, Inhale 2-3 L into the lungs daily.   potassium chloride SA (KLOR-CON M) 20 MEQ tablet, Take 1 tablet (20 mEq total) by mouth daily.   PRESCRIPTION MEDICATION, Inhale into the lungs at bedtime. cpap   Semaglutide-Weight Management (WEGOVY) 1 MG/0.5ML SOAJ, Inject 1 mg into the skin once a week.   I have ordered the following labs today:   Lab Orders         BMP8+Anion Gap         Magnesium       Referrals ordered today:   Referral Orders  No referral(s) requested today     I have ordered the following medication/changed the following medications:  Stop the following medications: There are no discontinued medications.   Start the following medications: No orders of the defined types were placed in this encounter.    Follow-up: as scheduled  Please make sure to arrive 15 minutes prior to your next appointment. If you arrive late, you may be asked to reschedule.   We look forward to seeing you next time. Please call our clinic at [phone] if you have any questions or concerns. The best time to call is Monday-Friday from 9am-4pm, but there is someone available 24/7. If after hours or the weekend, call the main hospital number and ask  for the Internal Medicine Resident On-Call. If you need medication refills, please notify your pharmacy one week in advance and they will send [place] a request.  Thank you for letting us take part in your care. Wishing you the best!  Thank you, Gwenevere Abbot, MD

## 2022-11-29 NOTE — Patient Instructions (Addendum)
Recommendations: Continue Trelegy 1 puff daily in the morning Take mucinex 600-1200mg  morning and evening as needed to loosen congestion Use flutter vale 2-3 times a day  Resume CPAP use, very important you wear every night  Continue supplemental oxygen 2L to keep O2 >88-90%   Orders: CPAP titration study   Follow-up: 4 months with Dr. Vassie Loll in [place]

## 2022-11-29 NOTE — Progress Notes (Signed)
@Patient  ID: Sharon Vasquez, female    DOB: [birth year], 72 y.o.   [name]: [id number]  Chief Complaint  Patient presents with   Follow-up    Hospital follow up for respiratory failure.  Persistent SOB with exertion.    Referring provider: [name], DO  HPI: 72 year old female, quit smoking October 2023.  Past medical history significant for COPD, RB-ILD, chronic respiratory failure with hypoxia, OSA, hypertension, A. fib, chronic anticoagulation, diastolic heart failure, aortic arthrosclerosis GERD, osteoporosis, chronic pain syndrome, obesity. Patient of Dr. Vassie Loll.  Maintained on CPAP.  Amiodarone discontinued.  Smoking cessation encouraged.  PMH - chronic diastolic heart failure.   -pulmonary embolism/ LLE DVT 2015  on Xarelto  s/p venoplasty and stenting of left common iliac and external iliac vein Duplex 05/2017 did not show any residual stenosis. -on Xarelto for chronic atrial fibrillation , amiodarone stopped 10/2019  Previous [facility] pulmonary encounter: 06/23/21- Dr. Vassie Loll Chief Complaint  Patient presents with   Follow-up    Follow up to talk about CT results. Pt states she wants to discuss prednisone because she is having some SOB    Reports increased shortness of breath over the past few weeks.  Previously when she took prednisone does remarkably help with her breathing. She continues to smoke a few cigarettes daily.  This is in spite of wearing a nicotine patch and using nicotine gum. She arrives in a wheelchair and on oxygen.  We reviewed high-resolution CT chest and previous PFTs which are mainly shown restriction She continues to be poorly compliant with diuretic, she does have a PureWick system which she uses occasionally  She denies any problems with CPAP mask or pressure, she is very compliant   12/13/2021 Patient presents today for overdue follow-up ILD/OSA. Last seen in March 2023 by Dr. Vassie Loll. HRCT imaging in February 2023 showed mild progression compared to prior  study suggestive of alternate diagnosis, differential includes RB ILD versus hypersensitivity pneumonitis.  Environmental history without obvious exposure.  Hypersensitivity pneumonitis panel was negative.  Seen in ED in August 2023 for AECOPD treated with Duonebs, solu-medrol and course of doxycycline. CXR concerning for cardiomegaly and pulmonary vascular congestion. Low suspicion for pulmonary infiltrate. Breathing is baseline. She experiences intermittent wheezing which resolves with ipratropium-albuterol nebulizer treatment. She has no significant cough. Claritin helps with congestion. She wears 2-3L oxygen when needed, she does not wear it all the time. She connects oxygen to CPAP unit. No issues with CPAP mask or pressure settings. She continues to smoke cigarettes, she has cut down to 4-5 a day. She tells me residents in her apartment complex smoke marijuana which bothers her. She is not weighing herself regularly and is only taking 20mg  Torsemide daily.   Airview download 11/12/2021 - 12/11/2021 30/30 days (100%); 19 days (63%) greater than 4 hours Average usage 4 hours 2 minutes Pressure 5 to 12 cm H2O (7.1cm h20- 95%) Air leaks 18.5 L/min (95%) AHI 0.2   11/29/2022- interim hx  Patient presents today for follow-up OSA, COPD, chronic respiratory failure, RB-ILD. She has had issue with her resmed CPAP working last several months, it will turn on and cut off after a couple hours. While in the nursing facility she was sent home with a philip's cpap, unsure of the type of machine. She was not educated on how to use machine. DME company is [employer].   Her breathing will wake her up at night. She also wakes up with dry mouth. She reports compliance with Trelegy inhaler  daily. Very minimal cough, daughter says she is not congested like she was in the hospital. She has chronic leg swelling. Taking Torsemide 60mg  daily and spirnolactone 25mg  daily. She is seeing primary care today.    Airview download  08/22/22-11/19/22 Usage days 19/90 days (21%); 11 days (12%) greater than 4 hours Average usage days used 4 hours 14 minutes Pressure 13 cm H2O Air leaks 10 L/min (95%) AHI 12.1   No Known Allergies  Immunization History  Administered Date(s) Administered   Moderna Sars-Covid-2 Vaccination 08/20/2019, 09/11/2019   PFIZER(Purple Top)SARS-COV-2 Vaccination 03/17/2020   Pneumococcal Conjugate-13 02/01/2017   Pneumococcal Polysaccharide-23 02/12/2020   Tdap 06/05/2019    Past Medical History:  Diagnosis Date   A-fib [facility])    Acute hypoxemic respiratory failure (HCC) 02/20/2022   Acute on chronic hypoxic respiratory failure (HCC) 01/04/2022   Acute upper respiratory infection 01/08/2022   Anxiety    Arthritis    "qwhre; joints, back" (04/17/2017)   Atrial fibrillation (HCC)    Benign breast cyst in female, left 01/08/2017   Found by Screening mammogram, evaluated by [facility]/S on 01/08/17 and determined to be a benign simple breast cyst.   Breast cancer (HCC)    Cellulitis of left lower leg 05/30/2017   CHF (congestive heart failure) (HCC)    Chronic low back pain 08/21/2016   Chronic lower back pain    Chronic venous insufficiency    /notes 05/30/2017   COPD (chronic obstructive pulmonary disease) (HCC)    Depression    Diabetes (HCC) 01/08/2022   Diabetes mellitus without complication (HCC)    DVT (deep venous thrombosis) (HCC) 11/16/2016   Dysrhythmia    Esophageal dysmotility 11/10/2019   Previous workup by ENT with fiberoptic leryngoscopy which was normal. Dx with laryngeal pharyngeal reflux.   GERD (gastroesophageal reflux disease)    Headache    "weekly for the last 3 months" (04/17/2017)   History of pulmonary embolism    Pulmonary embolism   Hyperlipidemia    Hypertension    Laryngopharyngeal reflux 12/18/2015   Morbid obesity (HCC)    PE (pulmonary embolism)    Pulmonary embolism (HCC) 09/21/2014   Sleep apnea    Stroke (HCC) 12/02/2021    Tobacco History: Social  History   Tobacco Use  Smoking Status Former   Current packs/day: 0.00   Average packs/day: 2.0 packs/day for 60.4 years (120.8 ttl pk-yrs)   Types: Cigarettes   Start date: 08/16/1961   Quit date: 01/02/2022   Years since quitting: 0.9  Smokeless Tobacco Never  Tobacco Comments   Pt smokes about 4 ciggs daily. Stopped 01/02/2022    Counseling given: Not Answered Tobacco comments: Pt smokes about 4 ciggs daily. Stopped 01/02/2022    Outpatient Medications Prior to Visit  Medication Sig Dispense Refill   albuterol (VENTOLIN HFA) 108 (90 Base) MCG/ACT inhaler Inhale 2 puffs into the lungs every 4 (four) hours as needed for shortness of breath. 18 g 1   aspirin EC 81 MG tablet Take 81 mg by mouth daily.     atorvastatin (LIPITOR) 80 MG tablet Take 1 tablet (80 mg total) by mouth daily. 100 tablet 2   empagliflozin (JARDIANCE) 10 MG TABS tablet Take 1 tablet (10 mg total) by mouth daily before breakfast. 30 tablet 2   esomeprazole (NEXIUM) 40 MG capsule Take 1 capsule (40 mg total) by mouth daily as needed (for heartburn or indigestion). (Patient taking differently: Take 40 mg by mouth daily.) 90 capsule 1   Fluticasone-Umeclidin-Vilant (  TRELEGY ELLIPTA) 100-62.5-25 MCG/ACT AEPB Inhale 1 puff into the lungs daily. 60 each 5   gabapentin (NEURONTIN) 400 MG capsule Take 1 capsule (400 mg total) by mouth 2 (two) times daily. 60 capsule 2   letrozole (FEMARA) 2.5 MG tablet TAKE 1 TABLET(2.5 MG) BY MOUTH DAILY (Patient taking differently: Take 2.5 mg by mouth daily. TAKE 1 TABLET(2.5 MG) BY MOUTH DAILY) 90 tablet 3   LORazepam (ATIVAN) 0.5 MG tablet Take 0.5 mg by mouth daily as needed for anxiety.     losartan (COZAAR) 25 MG tablet Take 1 tablet (25 mg total) by mouth daily. 30 tablet 11   metoprolol succinate (TOPROL-XL) 25 MG 24 hr tablet TAKE 1 TABLET BY MOUTH DAILY 100 tablet 2   naloxone (NARCAN) nasal spray 4 mg/0.1 mL Place 1 spray into the nose once as needed (for overdose suspected). for  opioid overdose     oxybutynin (DITROPAN) 5 MG tablet Take 1 tablet (5 mg total) by mouth 3 (three) times daily.     oxyCODONE (ROXICODONE) 15 MG immediate release tablet Take 15 mg by mouth See admin instructions. Take 15mg  (1 tablet) by mouth every morning and 15mg  (1 tablet) at bedtime in addition to twice during the day as needed.     OXYGEN Inhale 2-3 L into the lungs daily.     PRESCRIPTION MEDICATION Inhale into the lungs at bedtime. cpap     rivaroxaban (XARELTO) 20 MG TABS tablet Take 1 tablet (20 mg total) by mouth every morning. 90 tablet 3   torsemide (DEMADEX) 20 MG tablet Take 3 tablets (60 mg total) by mouth daily. 90 tablet 2   potassium chloride SA (KLOR-CON M) 20 MEQ tablet Take 1 tablet (20 mEq total) by mouth daily. 90 tablet 0   Semaglutide-Weight Management (WEGOVY) 1 MG/0.5ML SOAJ Inject 1 mg into the skin once a week. 2 mL 3   spironolactone (ALDACTONE) 25 MG tablet Take 1 tablet (25 mg total) by mouth daily. (Patient not taking: Reported on 12/01/2022)     No facility-administered medications prior to visit.      Review of Systems  Review of Systems  Constitutional: Negative.  Negative for fatigue.  HENT: Negative.    Respiratory: Negative.  Negative for cough, shortness of breath and wheezing.   Cardiovascular:  Positive for leg swelling.   Physical Exam  BP 104/78 (BP Location: Right Arm, Patient Position: Sitting, Cuff Size: Large)   Pulse 85   Temp 98.3 F (36.8 C) (Oral)   Ht 6\' 2"  (1.88 m)   Wt (!) 346 lb (156.9 kg)   SpO2 96%   BMI 44.42 kg/m  Physical Exam Constitutional:      General: She is not in acute distress.    Appearance: Normal appearance. She is obese. She is not ill-appearing.  HENT:     Head: Normocephalic and atraumatic.     Mouth/Throat:     Mouth: Mucous membranes are moist.     Pharynx: Oropharynx is clear.  Cardiovascular:     Rate and Rhythm: Normal rate and regular rhythm.  Pulmonary:     Effort: Pulmonary effort is  normal.     Breath sounds: Normal breath sounds. No wheezing, rhonchi or rales.     Comments: CTA Musculoskeletal:        General: Normal range of motion.  Skin:    General: Skin is warm and dry.  Neurological:     General: No focal deficit present.     Mental  Status: She is alert and oriented to person, place, and time. Mental status is at baseline.  Psychiatric:        Mood and Affect: Mood normal.        Behavior: Behavior normal.        Thought Content: Thought content normal.        Judgment: Judgment normal.      Lab Results:  CBC    Component Value Date/Time   WBC 6.8 12/01/2022 1327   RBC 4.54 12/01/2022 1327   HGB 12.5 12/01/2022 1327   HGB 11.3 (L) 03/23/2021 0727   HGB 12.4 04/29/2018 1235   HCT 38.3 12/01/2022 1327   HCT 37.2 04/29/2018 1235   PLT 452 (H) 12/01/2022 1327   PLT 341 03/23/2021 0727   PLT 389 04/29/2018 1235   MCV 84.4 12/01/2022 1327   MCV 86 04/29/2018 1235   MCH 27.5 12/01/2022 1327   MCHC 32.6 12/01/2022 1327   RDW 13.0 12/01/2022 1327   RDW 13.4 04/29/2018 1235   LYMPHSABS 2.3 11/05/2022 0143   MONOABS 0.8 11/05/2022 0143   EOSABS 0.2 11/05/2022 0143   BASOSABS 0.0 11/05/2022 0143    BMET    Component Value Date/Time   NA 139 12/02/2022 0240   NA 141 11/29/2022 1531   K 3.3 (L) 12/02/2022 0240   CL 100 12/02/2022 0240   CO2 30 12/02/2022 0240   GLUCOSE 132 (H) 12/02/2022 0240   BUN 16 12/02/2022 0240   BUN 18 11/29/2022 1531   CREATININE 1.02 (H) 12/02/2022 0240   CREATININE 0.92 03/23/2021 0727   CREATININE 1.21 (H) 07/26/2016 1528   CALCIUM 9.0 12/02/2022 0240   GFRNONAA 58 (L) 12/02/2022 0240   GFRNONAA >60 03/23/2021 0727   GFRAA 79 10/23/2019 0924    BNP    Component Value Date/Time   BNP 630.2 (H) 10/28/2022 2210    ProBNP No results found for: "PROBNP"  Imaging: No results found.   Assessment & Plan:   COPD (chronic obstructive pulmonary disease) (HCC) - Stable; Does not appear acutely exacerbated.  No overt wheezing. Very occasional cough. Lungs clear on exam. Continue Trelegy 1 puff daily in the morning. Advised she take mucinex 600-1200mg  morning and evening as needed to loosen congestion and use flutter vale 2-3 times a day   OSA (obstructive sleep apnea) - Reinforced importance of wearing CPAP nightly, advised to resume use. Having issues with current machine. Current pressure 13cm h20; Residual AHI 12.1/hour. Needs CPAP titration study   Chronic respiratory failure with hypoxia (HCC) - Stable; Continue supplemental oxygen 2L to keep O2 >88-90%   Heart failure with reduced ejection fraction (HCC) - Persistent dyspnea and chronic leg swelling - Continue Torsemide 60mg  daily and Spirnolactone 25mg  - Seeing PCP today    Glenford Bayley, NP 12/02/2022

## 2022-11-30 ENCOUNTER — Other Ambulatory Visit: Payer: Self-pay

## 2022-11-30 DIAGNOSIS — K219 Gastro-esophageal reflux disease without esophagitis: Secondary | ICD-10-CM | POA: Diagnosis not present

## 2022-11-30 DIAGNOSIS — I700 Atherosclerosis of aorta: Secondary | ICD-10-CM

## 2022-11-30 DIAGNOSIS — G4733 Obstructive sleep apnea (adult) (pediatric): Secondary | ICD-10-CM | POA: Diagnosis not present

## 2022-11-30 DIAGNOSIS — M810 Age-related osteoporosis without current pathological fracture: Secondary | ICD-10-CM | POA: Diagnosis not present

## 2022-11-30 DIAGNOSIS — G890 Central pain syndrome: Secondary | ICD-10-CM | POA: Diagnosis not present

## 2022-11-30 DIAGNOSIS — I5023 Acute on chronic systolic (congestive) heart failure: Secondary | ICD-10-CM | POA: Diagnosis not present

## 2022-11-30 DIAGNOSIS — J9621 Acute and chronic respiratory failure with hypoxia: Secondary | ICD-10-CM | POA: Diagnosis not present

## 2022-11-30 DIAGNOSIS — I4891 Unspecified atrial fibrillation: Secondary | ICD-10-CM | POA: Diagnosis not present

## 2022-11-30 DIAGNOSIS — I2510 Atherosclerotic heart disease of native coronary artery without angina pectoris: Secondary | ICD-10-CM | POA: Diagnosis not present

## 2022-11-30 DIAGNOSIS — Z7985 Long-term (current) use of injectable non-insulin antidiabetic drugs: Secondary | ICD-10-CM | POA: Diagnosis not present

## 2022-11-30 DIAGNOSIS — K5900 Constipation, unspecified: Secondary | ICD-10-CM | POA: Diagnosis not present

## 2022-11-30 DIAGNOSIS — E119 Type 2 diabetes mellitus without complications: Secondary | ICD-10-CM | POA: Diagnosis not present

## 2022-11-30 DIAGNOSIS — I959 Hypotension, unspecified: Secondary | ICD-10-CM | POA: Diagnosis not present

## 2022-11-30 DIAGNOSIS — E785 Hyperlipidemia, unspecified: Secondary | ICD-10-CM | POA: Diagnosis not present

## 2022-11-30 DIAGNOSIS — J449 Chronic obstructive pulmonary disease, unspecified: Secondary | ICD-10-CM | POA: Diagnosis not present

## 2022-11-30 DIAGNOSIS — I272 Pulmonary hypertension, unspecified: Secondary | ICD-10-CM | POA: Diagnosis not present

## 2022-11-30 DIAGNOSIS — Z7901 Long term (current) use of anticoagulants: Secondary | ICD-10-CM | POA: Diagnosis not present

## 2022-11-30 DIAGNOSIS — Z79891 Long term (current) use of opiate analgesic: Secondary | ICD-10-CM | POA: Diagnosis not present

## 2022-11-30 DIAGNOSIS — J9622 Acute and chronic respiratory failure with hypercapnia: Secondary | ICD-10-CM | POA: Diagnosis not present

## 2022-11-30 MED ORDER — WEGOVY 1 MG/0.5ML ~~LOC~~ SOAJ
1.0000 mg | SUBCUTANEOUS | 3 refills | Status: DC
Start: 2022-11-30 — End: 2023-03-21

## 2022-11-30 NOTE — Telephone Encounter (Signed)
Pharmacy called requesting a rx for wegovy to sent to them per patients request.  Pharmacy:[id number] - [place] ([place]), [place] - [address]

## 2022-12-01 ENCOUNTER — Encounter (HOSPITAL_COMMUNITY): Payer: Self-pay | Admitting: Internal Medicine

## 2022-12-01 ENCOUNTER — Other Ambulatory Visit (HOSPITAL_COMMUNITY): Payer: Self-pay

## 2022-12-01 ENCOUNTER — Other Ambulatory Visit: Payer: Self-pay

## 2022-12-01 ENCOUNTER — Observation Stay (HOSPITAL_COMMUNITY)
Admission: EM | Admit: 2022-12-01 | Payer: 59 | Source: Home / Self Care | Attending: Emergency Medicine | Admitting: Emergency Medicine

## 2022-12-01 ENCOUNTER — Telehealth: Payer: Self-pay | Admitting: *Deleted

## 2022-12-01 DIAGNOSIS — Z8673 Personal history of transient ischemic attack (TIA), and cerebral infarction without residual deficits: Secondary | ICD-10-CM | POA: Diagnosis not present

## 2022-12-01 DIAGNOSIS — I509 Heart failure, unspecified: Secondary | ICD-10-CM | POA: Diagnosis not present

## 2022-12-01 DIAGNOSIS — Z7985 Long-term (current) use of injectable non-insulin antidiabetic drugs: Secondary | ICD-10-CM | POA: Diagnosis not present

## 2022-12-01 DIAGNOSIS — J449 Chronic obstructive pulmonary disease, unspecified: Secondary | ICD-10-CM | POA: Diagnosis not present

## 2022-12-01 DIAGNOSIS — Z86711 Personal history of pulmonary embolism: Secondary | ICD-10-CM | POA: Insufficient documentation

## 2022-12-01 DIAGNOSIS — N3281 Overactive bladder: Secondary | ICD-10-CM | POA: Diagnosis not present

## 2022-12-01 DIAGNOSIS — J9620 Acute and chronic respiratory failure, unspecified whether with hypoxia or hypercapnia: Secondary | ICD-10-CM | POA: Insufficient documentation

## 2022-12-01 DIAGNOSIS — I110 Hypertensive heart disease with heart failure: Secondary | ICD-10-CM | POA: Insufficient documentation

## 2022-12-01 DIAGNOSIS — E119 Type 2 diabetes mellitus without complications: Secondary | ICD-10-CM | POA: Insufficient documentation

## 2022-12-01 DIAGNOSIS — Z853 Personal history of malignant neoplasm of breast: Secondary | ICD-10-CM | POA: Insufficient documentation

## 2022-12-01 DIAGNOSIS — Z86718 Personal history of other venous thrombosis and embolism: Secondary | ICD-10-CM | POA: Diagnosis not present

## 2022-12-01 DIAGNOSIS — Z79899 Other long term (current) drug therapy: Secondary | ICD-10-CM | POA: Insufficient documentation

## 2022-12-01 DIAGNOSIS — Z7901 Long term (current) use of anticoagulants: Secondary | ICD-10-CM | POA: Diagnosis not present

## 2022-12-01 DIAGNOSIS — Z87891 Personal history of nicotine dependence: Secondary | ICD-10-CM | POA: Diagnosis not present

## 2022-12-01 DIAGNOSIS — E876 Hypokalemia: Principal | ICD-10-CM | POA: Insufficient documentation

## 2022-12-01 DIAGNOSIS — Z7984 Long term (current) use of oral hypoglycemic drugs: Secondary | ICD-10-CM | POA: Diagnosis not present

## 2022-12-01 DIAGNOSIS — I4819 Other persistent atrial fibrillation: Secondary | ICD-10-CM | POA: Diagnosis not present

## 2022-12-01 DIAGNOSIS — Z7982 Long term (current) use of aspirin: Secondary | ICD-10-CM | POA: Insufficient documentation

## 2022-12-01 DIAGNOSIS — I5032 Chronic diastolic (congestive) heart failure: Secondary | ICD-10-CM

## 2022-12-01 DIAGNOSIS — R7989 Other specified abnormal findings of blood chemistry: Secondary | ICD-10-CM | POA: Diagnosis present

## 2022-12-01 HISTORY — DX: Hypokalemia: E87.6

## 2022-12-01 LAB — BMP8+ANION GAP
Anion Gap: 19 mmol/L — ABNORMAL HIGH (ref 10.0–18.0)
BUN/Creatinine Ratio: 19 (ref 12–28)
BUN: 18 mg/dL (ref 8–27)
CO2: 26 mmol/L (ref 20–29)
Calcium: 10 mg/dL (ref 8.7–10.3)
Chloride: 96 mmol/L (ref 96–106)
Creatinine, Ser: 0.96 mg/dL (ref 0.57–1.00)
Glucose: 147 mg/dL — ABNORMAL HIGH (ref 70–99)
Potassium: 2.7 mmol/L — ABNORMAL LOW (ref 3.5–5.2)
Sodium: 141 mmol/L (ref 134–144)
eGFR: 62 mL/min/{1.73_m2} (ref >59)

## 2022-12-01 LAB — CBC
HCT: 38.3 % (ref 36.0–46.0)
Hemoglobin: 12.5 g/dL (ref 12.0–15.0)
MCH: 27.5 pg (ref 26.0–34.0)
MCHC: 32.6 g/dL (ref 30.0–36.0)
MCV: 84.4 fL (ref 80.0–100.0)
Platelets: 452 10*3/uL — ABNORMAL HIGH (ref 150–400)
RBC: 4.54 MIL/uL (ref 3.87–5.11)
RDW: 13 % (ref 11.5–15.5)
WBC: 6.8 10*3/uL (ref 4.0–10.5)
nRBC: 0 % (ref 0.0–0.2)

## 2022-12-01 LAB — BASIC METABOLIC PANEL
Anion gap: 12 (ref 5–15)
BUN: 14 mg/dL (ref 8–23)
CO2: 29 mmol/L (ref 22–32)
Calcium: 9 mg/dL (ref 8.9–10.3)
Chloride: 96 mmol/L — ABNORMAL LOW (ref 98–111)
Creatinine, Ser: 0.99 mg/dL (ref 0.44–1.00)
GFR, Estimated: >60 mL/min (ref >60)
Glucose, Bld: 112 mg/dL — ABNORMAL HIGH (ref 70–99)
Potassium: 2.4 mmol/L — CL (ref 3.5–5.1)
Sodium: 137 mmol/L (ref 135–145)

## 2022-12-01 LAB — MAGNESIUM
Magnesium: 1.9 mg/dL (ref 1.6–2.3)
Magnesium: 2 mg/dL (ref 1.7–2.4)

## 2022-12-01 MED ORDER — OXYCODONE HCL 5 MG PO TABS
15.0000 mg | ORAL_TABLET | Freq: Four times a day (QID) | ORAL | Status: DC | PRN
  Administered 2022-12-01: 15 mg via ORAL
  Filled 2022-12-01: qty 3

## 2022-12-01 MED ORDER — POTASSIUM CHLORIDE CRYS ER 20 MEQ PO TBCR
40.0000 meq | EXTENDED_RELEASE_TABLET | Freq: Once | ORAL | Status: AC
  Administered 2022-12-01: 40 meq via ORAL
  Filled 2022-12-01: qty 2

## 2022-12-01 MED ORDER — ATORVASTATIN CALCIUM 80 MG PO TABS
80.0000 mg | ORAL_TABLET | Freq: Every day | ORAL | Status: DC
  Administered 2022-12-02: 80 mg via ORAL
  Filled 2022-12-01: qty 1

## 2022-12-01 MED ORDER — UMECLIDINIUM BROMIDE 62.5 MCG/ACT IN AEPB
1.0000 | INHALATION_SPRAY | Freq: Every day | RESPIRATORY_TRACT | Status: DC
  Administered 2022-12-02: 1 via RESPIRATORY_TRACT
  Filled 2022-12-01: qty 7

## 2022-12-01 MED ORDER — FLUTICASONE FUROATE-VILANTEROL 100-25 MCG/ACT IN AEPB
1.0000 | INHALATION_SPRAY | Freq: Every day | RESPIRATORY_TRACT | Status: DC
  Administered 2022-12-02: 1 via RESPIRATORY_TRACT
  Filled 2022-12-01: qty 28

## 2022-12-01 MED ORDER — POTASSIUM CHLORIDE CRYS ER 20 MEQ PO TBCR
40.0000 meq | EXTENDED_RELEASE_TABLET | ORAL | Status: AC
  Administered 2022-12-01 (×2): 40 meq via ORAL
  Filled 2022-12-01 (×2): qty 2

## 2022-12-01 MED ORDER — RIVAROXABAN 10 MG PO TABS
20.0000 mg | ORAL_TABLET | Freq: Every day | ORAL | Status: DC
  Administered 2022-12-02: 20 mg via ORAL
  Filled 2022-12-01: qty 2

## 2022-12-01 MED ORDER — METOPROLOL SUCCINATE ER 25 MG PO TB24
25.0000 mg | ORAL_TABLET | Freq: Every day | ORAL | Status: DC
  Administered 2022-12-02: 25 mg via ORAL
  Filled 2022-12-01: qty 1

## 2022-12-01 MED ORDER — ASPIRIN 81 MG PO TBEC
81.0000 mg | DELAYED_RELEASE_TABLET | Freq: Every day | ORAL | Status: DC
  Administered 2022-12-02: 81 mg via ORAL
  Filled 2022-12-01: qty 1

## 2022-12-01 MED ORDER — LETROZOLE 2.5 MG PO TABS
2.5000 mg | ORAL_TABLET | Freq: Every day | ORAL | Status: DC
  Administered 2022-12-02: 2.5 mg via ORAL
  Filled 2022-12-01: qty 1

## 2022-12-01 MED ORDER — EMPAGLIFLOZIN 10 MG PO TABS
10.0000 mg | ORAL_TABLET | Freq: Every day | ORAL | Status: DC
  Administered 2022-12-02: 10 mg via ORAL
  Filled 2022-12-01: qty 1

## 2022-12-01 MED ORDER — PANTOPRAZOLE SODIUM 40 MG PO TBEC
40.0000 mg | DELAYED_RELEASE_TABLET | Freq: Every day | ORAL | Status: DC
  Administered 2022-12-02: 40 mg via ORAL
  Filled 2022-12-01: qty 1

## 2022-12-01 MED ORDER — POTASSIUM CHLORIDE 10 MEQ/100ML IV SOLN
10.0000 meq | INTRAVENOUS | Status: AC
  Administered 2022-12-01 (×2): 10 meq via INTRAVENOUS
  Filled 2022-12-01 (×2): qty 100

## 2022-12-01 MED ORDER — LOSARTAN POTASSIUM 50 MG PO TABS
25.0000 mg | ORAL_TABLET | Freq: Every day | ORAL | Status: DC
  Administered 2022-12-02: 25 mg via ORAL
  Filled 2022-12-01: qty 1

## 2022-12-01 MED ORDER — ALBUTEROL SULFATE (2.5 MG/3ML) 0.083% IN NEBU: 2.5000 mg | INHALATION_SOLUTION | RESPIRATORY_TRACT | Status: DC | PRN

## 2022-12-01 MED ORDER — POTASSIUM CHLORIDE CRYS ER 20 MEQ PO TBCR
40.0000 meq | EXTENDED_RELEASE_TABLET | Freq: Once | ORAL | Status: AC
  Administered 2022-12-02: 40 meq via ORAL
  Filled 2022-12-01: qty 2

## 2022-12-01 MED ORDER — MAGNESIUM SULFATE 2 GM/50ML IV SOLN
2.0000 g | Freq: Once | INTRAVENOUS | Status: AC
  Administered 2022-12-01: 2 g via INTRAVENOUS
  Filled 2022-12-01: qty 50

## 2022-12-01 MED ORDER — GABAPENTIN 400 MG PO CAPS
400.0000 mg | ORAL_CAPSULE | Freq: Two times a day (BID) | ORAL | Status: DC
  Administered 2022-12-01 – 2022-12-02 (×2): 400 mg via ORAL
  Filled 2022-12-01 (×2): qty 1

## 2022-12-01 MED ORDER — OXYCODONE HCL 5 MG PO TABS: 15.0000 mg | ORAL_TABLET | ORAL | Status: DC

## 2022-12-01 MED ORDER — RIVAROXABAN 10 MG PO TABS
10.0000 mg | ORAL_TABLET | Freq: Every day | ORAL | Status: DC
  Filled 2022-12-01: qty 1

## 2022-12-01 MED ORDER — OXYBUTYNIN CHLORIDE 5 MG PO TABS
5.0000 mg | ORAL_TABLET | Freq: Three times a day (TID) | ORAL | Status: DC
  Administered 2022-12-01 – 2022-12-02 (×2): 5 mg via ORAL
  Filled 2022-12-01 (×2): qty 1

## 2022-12-01 NOTE — ED Notes (Signed)
 Lab made aware of Magnesium add-on.

## 2022-12-01 NOTE — Progress Notes (Signed)
Internal Medicine Clinic Attending  Case discussed with Dr. Khan  At the time of the visit.  We reviewed the resident's history and exam and pertinent patient test results.  I agree with the assessment, diagnosis, and plan of care documented in the resident's note.  

## 2022-12-01 NOTE — Addendum Note (Signed)
Addended by: Gwenevere Abbot on: 12/01/2022 07:36 AM   Modules accepted: Orders

## 2022-12-01 NOTE — Assessment & Plan Note (Addendum)
Pt is euvolemic on exam today. Weight at 338 lbs. Current regimen includes lasix 60 mg, spirinolactone 25 mg every day, Jardiance 10 mg daily, metoprolol 25 mg every day. Her torsemide was recently increased to 60 mg from 40 mg every day. Repeat BMP shows creatinine at 0.96. K is low at 2.7. Pt is on supplementation and will ensure if she is taking it currently and if she is will have her take 40 mEq for 4 days and repeat BMP next week. She may need up-titration of this given increase in the torsemide. Magnesium was checked and it was normal at 1.9. Cardiology recommends repeat echo in 3 months. Will ensure she has this scheduled at follow up appointment. She has a follow up with [facility] on 09/11.

## 2022-12-01 NOTE — Assessment & Plan Note (Signed)
Will message our pharmacy technician to assist pt in getting her Wegovy. This appears to have been approved previously but pt still does not have it. If I need to re-prescribe it, I will send it again.

## 2022-12-01 NOTE — ED Triage Notes (Signed)
Patient arrives at advisement of IM clinic for eval of potassium of 2.7 in the office two days ago. Denies complaints.

## 2022-12-01 NOTE — H&P (Cosign Needed)
Date: 12/01/2022               Patient Name:  Sharon Vasquez [name]: [id number]  DOB: May 18, 1950 Age / Sex: 72 y.o., female   PCP: [name], DO         Medical Service: Internal Medicine Teaching Service         Attending Physician: Dr. Earl Lagos, MD      First Contact: Dr. Katheran James, DO Pager [phone]    Second Contact: Dr. Rocky Morel, DO Pager [phone]         After Hours (After 5p/  First Contact Pager: [phone]  weekends / holidays): Second Contact Pager: [phone]   SUBJECTIVE   Chief Complaint: Abnormal lab - low potassium  History of Present Illness:  Sharon Vasquez is a 72 yo F with PMH significant for persistent atrial fibrillation on anticoagulation, chronic hypoxic respiratory failure 2/2 COPD on 2L supplemental oxygen at home, HFrEF with EF below 20%, chronic venous insufficiency, obstructive sleep apnea, GERD, previous breast cancer, and obesity class III who presented to [facility] ED at request of her PCP due to hypokalemia. This was discovered via labs from an appointmnet she had two days ago. She denies any symptoms or complaints other than a L-sided headache. She has not noticed chest pain, muscle cramps, palpitations, weakness, or abdominal pain. She states that her diuretic meds which include lasix spironolactone and torsemide have been changed over the past few office visits and that she is currently on a lower dose of her supplemental potassium than she once was.  ED Course: Pt arrived to [facility] ED early afternoon on 8/30 at request of her PCP due to low potassium K of 2.7 in office two days ago was repeated to 2.4. Vitals stable in ED. ECG without acute changes. BMP otherwise unremarkable. CBC with stable thrombocythemia.  Past Medical History Past Medical History:  Diagnosis Date   A-fib (HCC)    Acute hypoxemic respiratory failure (HCC) 02/20/2022   Acute on chronic hypoxic respiratory failure (HCC) 01/04/2022   Acute upper respiratory infection  01/08/2022   Anxiety    Arthritis    "qwhre; joints, back" (04/17/2017)   Atrial fibrillation (HCC)    Benign breast cyst in female, left 01/08/2017   Found by Screening mammogram, evaluated by U/S on 01/08/17 and determined to be a benign simple breast cyst.   Breast cancer (HCC)    Cellulitis of left lower leg 05/30/2017   CHF (congestive heart failure) (HCC)    Chronic low back pain 08/21/2016   Chronic lower back pain    Chronic venous insufficiency    /notes 05/30/2017   COPD (chronic obstructive pulmonary disease) (HCC)    Depression    Diabetes (HCC) 01/08/2022   Diabetes mellitus without complication (HCC)    DVT (deep venous thrombosis) (HCC) 11/16/2016   Dysrhythmia    Esophageal dysmotility 11/10/2019   Previous workup by ENT with fiberoptic leryngoscopy which was normal. Dx with laryngeal pharyngeal reflux.   GERD (gastroesophageal reflux disease)    Headache    "weekly for the last 3 months" (04/17/2017)   History of pulmonary embolism    Pulmonary embolism   Hyperlipidemia    Hypertension    Laryngopharyngeal reflux 12/18/2015   Morbid obesity (HCC)    PE (pulmonary embolism)    Pulmonary embolism (HCC) 09/21/2014   Sleep apnea    Stroke (HCC) 12/02/2021     Meds:  Albuterol inhaler Aspirin 81mg  every day Lipitor  80mg  every day  Jardiance 10mg  every day  Esomeprazole 40mg  every day prn Trelegy Ellipta 100-62.5-25 1 puff every day Gabapentin 400mg  BID Letrozole 2.5mg  every day Losartan 25mg  every day Metroprolol succinate XL 25mg  every day Oxybutynin 5mg  TID Oxycodone 15mg  BID and BID prn Naloxone spray Potassium chloride every day Xarelto 20mg  every day Semaglutide 1mg  injection weekly Spironolactone 25mg  every day Torsemide 60mg  every day Oxygen 2-3L  Past Surgical History Past Surgical History:  Procedure Laterality Date   ABDOMINAL HYSTERECTOMY     APPENDECTOMY     BALLOON DILATION N/A 03/09/2020   Procedure: BALLOON DILATION;   Surgeon: Kathi Der, MD;  Location: [facility];  Service: Gastroenterology;  Laterality: N/A;   BIOPSY  03/09/2020   Procedure: BIOPSY;  Surgeon: Kathi Der, MD;  Location: [facility];  Service: Gastroenterology;;   BIOPSY  04/12/2021   Procedure: BIOPSY;  Surgeon: Kathi Der, MD;  Location: [facility];  Service: Gastroenterology;;   BREAST BIOPSY Right 03/11/2021   US biopsy/ coil clip/ path pending   BREAST BIOPSY Right 03/11/2021   US biopsy/ ribbone clip/ path pending   BREAST CYST EXCISION Left    "six o'clock"   BREAST LUMPECTOMY Left    BREAST LUMPECTOMY WITH RADIOACTIVE SEED LOCALIZATION Right 05/02/2021   Procedure: RIGHT BREAST LUMPECTOMY WITH RADIOACTIVE SEED LOCALIZATION;  Surgeon: Griselda Miner, MD;  Location: [facility];  Service: General;  Laterality: Right;   CHOLECYSTECTOMY     DILATION AND CURETTAGE OF UTERUS     ESOPHAGOGASTRODUODENOSCOPY (EGD) WITH PROPOFOL N/A 03/09/2020   Procedure: ESOPHAGOGASTRODUODENOSCOPY (EGD) WITH PROPOFOL;  Surgeon: Kathi Der, MD;  Location: [facility];  Service: Gastroenterology;  Laterality: N/A;   ESOPHAGOGASTRODUODENOSCOPY (EGD) WITH PROPOFOL N/A 04/12/2021   Procedure: ESOPHAGOGASTRODUODENOSCOPY (EGD) WITH PROPOFOL;  Surgeon: Kathi Der, MD;  Location: [facility];  Service: Gastroenterology;  Laterality: N/A;   IR ABLATE LIVER CRYOABLATION  07/23/2019   IR RADIOLOGIST EVAL & MGMT  07/18/2019   RIGHT/LEFT HEART CATH AND CORONARY ANGIOGRAPHY N/A 11/01/2022   Procedure: RIGHT/LEFT HEART CATH AND CORONARY ANGIOGRAPHY;  Surgeon: Swaziland, Peter M, MD;  Location[facility] INVASIVE CV LAB;  Service: Cardiovascular;  Laterality: N/A;   SAVORY DILATION N/A 04/12/2021   Procedure: SAVORY DILATION;  Surgeon: Kathi Der, MD;  Location: [facility];  Service: Gastroenterology;  Laterality: N/A;   TONSILLECTOMY AND ADENOIDECTOMY     TUBAL LIGATION      Social:  Lives Her daughter in [place] Occupation:  retired Support: Family Level of Function: Wheelchair bound but independent in ADLs PCP: [name], DO Substances: Quit smoking in 2023, denies other substances  Family History:  Breast cancer in her mother  Hyperlipidemia in her maternal grandfather and mother Hypertension in her maternal grandfather and mother.   Allergies: Allergies as of 12/01/2022   (No Known Allergies)    Review of Systems: A complete ROS was negative except as per HPI.   OBJECTIVE:   Physical Exam: Blood pressure 105/84, pulse 82, temperature 98.2 F (36.8 C), temperature source Oral, resp. rate 15, SpO2 99%.  Constitutional: chronically ill-appearing. In no acute distress. HENT: Normocephalic, atraumatic,  Eyes: Sclera non-icteric, PERRL, EOM intact Neck:normal atraumatic, no neck masses, normal thyroid, no jvd Cardio: irregularly irregular rate and rhythm. No murmurs, rubs, or gallops. Pulm: On baseline 2L [place] supplemental oxygen. Clear to auscultation bilaterally. Normal work of breathing on room air. Abdomen: Soft, non-tender, non-distended, positive bowel sounds. MSK: Chronic tense edema in BL legs up to knees with stasis dermatitis  Skin:Warm and dry. Neuro:Alert and oriented x3. No focal deficit noted. Psych:Pleasant mood and affect.  Labs: CBC    Component Value Date/Time   WBC 6.8 12/01/2022 1327   RBC 4.54 12/01/2022 1327   HGB 12.5 12/01/2022 1327   HGB 11.3 (L) 03/23/2021 0727   HGB 12.4 04/29/2018 1235   HCT 38.3 12/01/2022 1327   HCT 37.2 04/29/2018 1235   PLT 452 (H) 12/01/2022 1327   PLT 341 03/23/2021 0727   PLT 389 04/29/2018 1235   MCV 84.4 12/01/2022 1327   MCV 86 04/29/2018 1235   MCH 27.5 12/01/2022 1327   MCHC 32.6 12/01/2022 1327   RDW 13.0 12/01/2022 1327   RDW 13.4 04/29/2018 1235   LYMPHSABS 2.3 11/05/2022 0143   MONOABS 0.8 11/05/2022 0143   EOSABS 0.2 11/05/2022 0143   BASOSABS 0.0 11/05/2022 0143     CMP     Component Value Date/Time   NA 137  12/01/2022 1327   NA 141 11/29/2022 1531   K 2.4 (LL) 12/01/2022 1327   CL 96 (L) 12/01/2022 1327   CO2 29 12/01/2022 1327   GLUCOSE 112 (H) 12/01/2022 1327   BUN 14 12/01/2022 1327   BUN 18 11/29/2022 1531   CREATININE 0.99 12/01/2022 1327   CREATININE 0.92 03/23/2021 0727   CREATININE 1.21 (H) 07/26/2016 1528   CALCIUM 9.0 12/01/2022 1327   PROT 7.2 11/02/2022 0301   ALBUMIN 3.8 11/02/2022 0301   AST 28 11/02/2022 0301   AST 17 03/23/2021 0727   ALT 26 11/02/2022 0301   ALT 15 03/23/2021 0727   ALKPHOS 78 11/02/2022 0301   BILITOT 0.9 11/02/2022 0301   BILITOT 0.6 03/23/2021 0727   GFRNONAA >60 12/01/2022 1327   GFRNONAA >60 03/23/2021 0727   GFRAA 79 10/23/2019 0924    Imaging: No results found.   EKG: personally reviewed my interpretation is atrial fibrillation without rapid response and also prolonged QT interval. Consistent with prior EKG.  ASSESSMENT & PLAN:   Assessment & Plan by Problem: Active Problems:   Hypokalemia  Sharon Vasquez is a 72 y.o. female with pertinent PMH of persistent atrial fibrillation on anticoagulation, chronic hypoxic respiratory failure 2/2 COPD on 2L supplemental oxygen at home, chronic diastolic heart failure, chronic venous insufficiency, obstructive sleep apnea, and obesity class III who presented with low potassium and is admitted for hypokalemia.  Hypokalemia Pt with K 2.4 in ED originally discovered at primary care. ECG is unremakrable for T wave inversions or ST depression. She is asymptomatic. She denies recent illness, vomiting, reduced oral intake. She has had changes to her diuretic regimen in the recent past - most recently increasing torsemide from 40mg  to 60mg  every day.  - home regimen is Potassium chloride every day - IV K and Mg repletion started in ED, will further replete with oral potassium - scheduled overnight - Mg is 2.0 WNL - Monitor BMP  HFrEF without exacerbation Pt was hospitalized for acute on  chronic respiratory failure secondary to volume overload over the summer. Recent ECHO shows EF <20%. She appears euvolemic on exam today. She does not have increased oxygen requirements. Lungs are dry. - continue home Jardiance 10mg  every day  - continue home Metroprolol succinate XL 25mg  every day - continue home Aldactone 25mg  every day - continue home losartan 25mg  every day - hold home torsemide 60mg  every day  Persistent Atrial Fibrillation with Controlled Ventricular Response Rate is irregularly irregular and controlled. - continue home Metroprolol succinate XL  25mg  every day - continue home Xarelto 20mg  every day  Chronic Respiratory Failure secondary to COPD She has a history of COPD and requires 2L oxygen [place] at baseline. Not currently hypoxic, no respiratory distress, no wheezes. - duonebs q4h prn - continue home Trelegy Ellipta 100-62.5-25 1 puff every day - SpO2 goal above 92%  GERD - continue home esomeprazole 40mg    HLD - continue home Lipitor 80mg  every day  - continue home Aspirin 81mg  every day  Overactive Bladder - continue home oxybutynin 5mg  TID  History of breast cancer - continue home letrozole 2.5mg  every day for now  Chronic pain - continue home Gabapentin 400mg  BID - continue home Oxycodone 15mg  q6hr prn  Diet: Normal VTE: DOAC IVF: None,None Code: Full  Dispo: Admit patient to Observation with expected length of stay less than 2 midnights.  Signed: Katheran James, DO Internal Medicine Resident PGY-1  12/01/2022, 6:33 PM   Dr. Katheran James, DO Pager [phone]

## 2022-12-01 NOTE — Progress Notes (Signed)
   12/01/22 2228  BiPAP/CPAP/SIPAP  $ Non-Invasive Ventilator  Non-Invasive Vent Initial;Non-Invasive Vent Set Up  $ Face Mask Medium Yes  BiPAP/CPAP/SIPAP Pt Type Adult  BiPAP/CPAP/SIPAP Resmed  Mask Type Full face mask  Mask Size Medium  EPAP 13 cmH2O  Flow Rate 2 lpm  Patient Home Equipment No  Auto Titrate No   Pt resting comfortably on CPAP on her home settings.  RT will continue to monitor

## 2022-12-01 NOTE — ED Notes (Signed)
ED TO INPATIENT HANDOFF REPORT  ED Nurse Name and Phone #: Lenis Dickinson [phone]  S Name/Age/Gender Sharon Vasquez 72 y.o. female Room/Bed: 031C/031C  Code Status   Code Status: Full Code  Home/SNF/Other Home Patient oriented to: self, place, time, and situation Is this baseline? Yes   Triage Complete: Triage complete  Chief Complaint Hypokalemia [E87.6]  Triage Note Patient arrives at advisement of IM clinic for eval of potassium of 2.7 in the office two days ago. Denies complaints.    Allergies No Known Allergies  Level of Care/Admitting Diagnosis ED Disposition     ED Disposition  Admit   Condition  --   Comment  Hospital Area: [facility] [[id number]]  Level of Care: Med-Surg [16]  May place patient in observation at [facility] or [facility] if equivalent level of care is available:: Yes  Covid Evaluation: Asymptomatic - no recent exposure (last 10 days) testing not required  Diagnosis: Hypokalemia [172180]  Admitting Physician: Earl Lagos [[id number]]  Attending Physician: Earl Lagos [[id number]]          B Medical/Surgery History Past Medical History:  Diagnosis Date   A-fib (HCC)    Acute hypoxemic respiratory failure (HCC) 02/20/2022   Acute on chronic hypoxic respiratory failure (HCC) 01/04/2022   Acute upper respiratory infection 01/08/2022   Anxiety    Arthritis    "qwhre; joints, back" (04/17/2017)   Atrial fibrillation (HCC)    Benign breast cyst in female, left 01/08/2017   Found by Screening mammogram, evaluated by U/S on 01/08/17 and determined to be a benign simple breast cyst.   Breast cancer (HCC)    Cellulitis of left lower leg 05/30/2017   CHF (congestive heart failure) (HCC)    Chronic low back pain 08/21/2016   Chronic lower back pain    Chronic venous insufficiency    /notes 05/30/2017   COPD (chronic obstructive pulmonary disease) (HCC)    Depression    Diabetes (HCC) 01/08/2022   Diabetes mellitus without  complication (HCC)    DVT (deep venous thrombosis) (HCC) 11/16/2016   Dysrhythmia    Esophageal dysmotility 11/10/2019   Previous workup by ENT with fiberoptic leryngoscopy which was normal. Dx with laryngeal pharyngeal reflux.   GERD (gastroesophageal reflux disease)    Headache    "weekly for the last 3 months" (04/17/2017)   History of pulmonary embolism    Pulmonary embolism   Hyperlipidemia    Hypertension    Laryngopharyngeal reflux 12/18/2015   Morbid obesity (HCC)    PE (pulmonary embolism)    Pulmonary embolism (HCC) 09/21/2014   Sleep apnea    Stroke (HCC) 12/02/2021   Past Surgical History:  Procedure Laterality Date   ABDOMINAL HYSTERECTOMY     APPENDECTOMY     BALLOON DILATION N/A 03/09/2020   Procedure: BALLOON DILATION;  Surgeon: Kathi Der, MD;  Location: [facility];  Service: Gastroenterology;  Laterality: N/A;   BIOPSY  03/09/2020   Procedure: BIOPSY;  Surgeon: Kathi Der, MD;  Location: [facility];  Service: Gastroenterology;;   BIOPSY  04/12/2021   Procedure: BIOPSY;  Surgeon: Kathi Der, MD;  Location: [facility];  Service: Gastroenterology;;   BREAST BIOPSY Right 03/11/2021   US biopsy/ coil clip/ path pending   BREAST BIOPSY Right 03/11/2021   US biopsy/ ribbone clip/ path pending   BREAST CYST EXCISION Left    "six o'clock"   BREAST LUMPECTOMY Left    BREAST LUMPECTOMY WITH RADIOACTIVE SEED LOCALIZATION Right 05/02/2021   Procedure:  RIGHT BREAST LUMPECTOMY WITH RADIOACTIVE SEED LOCALIZATION;  Surgeon: Griselda Miner, MD;  Location: [facility];  Service: General;  Laterality: Right;   CHOLECYSTECTOMY     DILATION AND CURETTAGE OF UTERUS     ESOPHAGOGASTRODUODENOSCOPY (EGD) WITH PROPOFOL N/A 03/09/2020   Procedure: ESOPHAGOGASTRODUODENOSCOPY (EGD) WITH PROPOFOL;  Surgeon: Kathi Der, MD;  Location: [facility];  Service: Gastroenterology;  Laterality: N/A;   ESOPHAGOGASTRODUODENOSCOPY (EGD) WITH PROPOFOL N/A 04/12/2021    Procedure: ESOPHAGOGASTRODUODENOSCOPY (EGD) WITH PROPOFOL;  Surgeon: Kathi Der, MD;  Location: [facility];  Service: Gastroenterology;  Laterality: N/A;   IR ABLATE LIVER CRYOABLATION  07/23/2019   IR RADIOLOGIST EVAL & MGMT  07/18/2019   RIGHT/LEFT HEART CATH AND CORONARY ANGIOGRAPHY N/A 11/01/2022   Procedure: RIGHT/LEFT HEART CATH AND CORONARY ANGIOGRAPHY;  Surgeon: Swazil[place], Peter M, MD;  Location: [facility] INVASIVE CV LAB;  Service: Cardiovascular;  Laterality: N/A;   SAVORY DILATION N/A 04/12/2021   Procedure: SAVORY DILATION;  Surgeon: Kathi Der, MD;  Location: [facility];  Service: Gastroenterology;  Laterality: N/A;   TONSILLECTOMY AND ADENOIDECTOMY     TUBAL LIGATION       A IV Location/Drains/Wounds Patient Lines/Drains/Airways Status     Active Line/Drains/Airways     Name Placement date Placement time Site Days   Peripheral IV 12/01/22 20 G 1.25" Left Antecubital 12/01/22  1652  Antecubital  less than 1   Wound / Incision (Open or Dehisced) 10/29/22 Venous stasis ulcer Tibial Posterior;Right;Lower 10/29/22  1216  Tibial  33   Wound / Incision (Open or Dehisced) 10/29/22 Venous stasis ulcer Pretibial Left 10/29/22  1216  Pretibial  33            Intake/Output Last 24 hours No intake or output data in the 24 hours ending 12/01/22 1658  Labs/Imaging Results for orders placed or performed during the hospital encounter of 12/01/22 (from the past 48 hour(s))  CBC     Status: Abnormal   Collection Time: 12/01/22  1:27 PM  Result Value Ref Range   WBC 6.8 4.0 - 10.5 K/uL   RBC 4.54 3.87 - 5.11 MIL/uL   Hemoglobin 12.5 12.0 - 15.0 g/dL   HCT 40.9 81.1 - 91.4 %   MCV 84.4 80.0 - 100.0 fL   MCH 27.5 26.0 - 34.0 pg   MCHC 32.6 30.0 - 36.0 g/dL   RDW 78.2 95.6 - 21.3 %   Platelets 452 (H) 150 - 400 K/uL   nRBC 0.0 0.0 - 0.2 %    Comment: Performed at [facility], [address], [place], [place] [address]  Basic metabolic panel     Status: Abnormal    Collection Time: 12/01/22  1:27 PM  Result Value Ref Range   Sodium 137 135 - 145 mmol/L   Potassium 2.4 (LL) 3.5 - 5.1 mmol/L    Comment: CRITICAL RESULT CALLED TO, READ BACK BY AND VERIFIED WITH A Sarely Stracener R 12/01/2022 1533 BNUNNERY   Chloride 96 (L) 98 - 111 mmol/L   CO2 29 22 - 32 mmol/L   Glucose, Bld 112 (H) 70 - 99 mg/dL    Comment: Glucose reference range applies only to samples taken after fasting for at least 8 hours.   BUN 14 8 - 23 mg/dL   Creatinine, Ser 8.46 0.44 - 1.00 mg/dL   Calcium 9.0 8.9 - 96.2 mg/dL   GFR, Estimated >95 >28 mL/min    Comment: (NOTE) Calculated using the CKD-EPI Creatinine Equation (2021)    Anion gap  12 5 - 15    Comment: Performed at [facility], [address], [place], [place] [address]  Magnesium     Status: None   Collection Time: 12/01/22  3:01 PM  Result Value Ref Range   Magnesium 2.0 1.7 - 2.4 mg/dL    Comment: Performed at [facility], [address], [place], [place] [address]   No results found.  Pending Labs Unresulted Labs (From admission, onward)    None       Vitals/Pain Today's Vitals   12/01/22 1535 12/01/22 1545 12/01/22 1615 12/01/22 1630  BP: 109/69 (!) 120/90 107/78 105/84  Pulse: 88 80 72 82  Resp: 16 16 15    Temp:      TempSrc:      SpO2: 99% 100% 99% 99%  PainSc:        Isolation Precautions No active isolations  Medications Medications  magnesium sulfate IVPB 2 g 50 mL (has no administration in time range)  potassium chloride SA (KLOR-CON M) CR tablet 40 mEq (has no administration in time range)  potassium chloride 10 mEq in 100 mL IVPB (has no administration in time range)  rivaroxaban (XARELTO) tablet 10 mg (has no administration in time range)    Mobility power wheelchair     Focused Assessments Cardiac Assessment Handoff:    Lab Results  Component Value Date   TROPONINI 0.07 (HH) 05/30/2017   No results found for: "DDIMER" Does the Patient currently have chest pain? No     R Recommendations: See Admitting Provider Note  Report given to:   Additional Notes: Pt came in d/t abnormal potassium. Pt voiced numerous additional complaints to Hospitalist team.

## 2022-12-01 NOTE — Hospital Course (Addendum)
No chest pain Constipation, last bm today Polyuria Headache    Admitted 12/01/2022  Allergies: Patient has no known allergies. Pertinent Hx: PAF, COPD on 2 L, HFrEF, OSA  72 y.o. female p/w hypokalemia  *Hypokalemia: Repleting  Consults: none  Meds: home meds VTE ppx: xarelto IVF: none Diet: HH

## 2022-12-01 NOTE — ED Provider Notes (Signed)
[place] EMERGENCY DEPARTMENT AT [facility] Provider Note   CSN: [id number] Arrival date & time: 12/01/22  1238     History  Chief Complaint  Patient presents with   Abnormal Lab    Sharon Vasquez is a 72 y.o. female.  72 year old female with past medical history significant for CHF presents today for concern of abnormal lab.  Her potassium at recent PCP visit 2 days ago was 2.7.  She is on multiple diuretics including Lasix, torsemide, as well as parental lactone.  Her torsemide she did not take her torsemide dose today  dosage has been increased recently to 60 mg.  She denies any shortness of breath.  Currently wearing her baseline supplemental O2 at 2 L.  She was given a call from the internal medicine clinic to come into the emergency department due to the low potassium.  The history is provided by the patient. No language interpreter was used.       Home Medications Prior to Admission medications   Medication Sig Start Date End Date Taking? Authorizing Provider  albuterol (VENTOLIN HFA) 108 (90 Base) MCG/ACT inhaler Inhale 2 puffs into the lungs every 4 (four) hours as needed for shortness of breath. 07/10/22   Atway, Rayann N, DO  aspirin EC 81 MG tablet Take 81 mg by mouth daily. 12/15/20   [provider]  atorvastatin (LIPITOR) 80 MG tablet Take 1 tablet (80 mg total) by mouth daily. 10/09/22   Atway, Rayann N, DO  empagliflozin (JARDIANCE) 10 MG TABS tablet Take 1 tablet (10 mg total) by mouth daily before breakfast. 11/16/22   Clegg, Amy D, NP  esomeprazole (NEXIUM) 40 MG capsule Take 1 capsule (40 mg total) by mouth daily as needed (for heartburn or indigestion). Patient taking differently: Take 40 mg by mouth daily. 01/20/19   Synetta Fail, MD  Fluticasone-Umeclidin-Vilant (TRELEGY ELLIPTA) 100-62.5-25 MCG/ACT AEPB Inhale 1 puff into the lungs daily. 11/06/22   Nooruddin, Jason Fila, MD  gabapentin (NEURONTIN) 400 MG capsule Take 1 capsule (400 mg total)  by mouth 2 (two) times daily. 11/16/22 11/16/23  Alexander-Savino, [place], MD  letrozole [facility]) 2.5 MG tablet TAKE 1 TABLET(2.5 MG) BY MOUTH DAILY 06/14/22   Serena Croissant, MD  LORazepam (ATIVAN) 0.5 MG tablet Take 0.5 mg by mouth daily as needed for anxiety.    [provider]  losartan (COZAAR) 25 MG tablet Take 1 tablet (25 mg total) by mouth daily. 02/22/22 02/22/23  Modena Slater, DO  metoprolol succinate (TOPROL-XL) 25 MG 24 hr tablet TAKE 1 TABLET BY MOUTH DAILY 02/09/22   Merrilyn Puma, MD  naloxone [facility]) nasal spray 4 mg/0.1 mL Place 1 spray into the nose once as needed (for overdose suspected). for opioid overdose 11/02/21   [provider]  oxybutynin (DITROPAN) 5 MG tablet Take 1 tablet (5 mg total) by mouth 3 (three) times daily. 10/17/22   Marrianne Mood, MD  oxyCODONE (ROXICODONE) 15 MG immediate release tablet Take 15 mg by mouth See admin instructions. Take 15mg  (1 tablet) by mouth every morning and 15mg  (1 tablet) at bedtime in addition to twice during the day as needed. 08/05/20   [provider]  OXYGEN Inhale 2-3 L into the lungs daily.    [provider]  potassium chloride SA (KLOR-CON M) 20 MEQ tablet Take 1 tablet (20 mEq total) by mouth daily. 10/10/22   Marrianne Mood, MD  PRESCRIPTION MEDICATION Inhale into the lungs at bedtime. cpap    [provider]  rivaroxaban (XARELTO) 20 MG TABS tablet Take 1 tablet (20 mg total) by mouth every morning. 02/13/22   Merrilyn Puma, MD  Semaglutide-Weight Management [facility]) 1 MG/0.5ML SOAJ Inject 1 mg into the skin once a week. 11/30/22   Atway, Rayann N, DO  spironolactone (ALDACTONE) 25 MG tablet Take 1 tablet (25 mg total) by mouth daily. 11/07/22   Nooruddin, Jason Fila, MD  torsemide (DEMADEX) 20 MG tablet Take 3 tablets (60 mg total) by mouth daily. 11/16/22   Tonye Becket D, NP      Allergies    Patient has no known allergies.    Review of Systems   Review of Systems  Constitutional:   Negative for fever.  Respiratory:  Negative for shortness of breath.   Gastrointestinal:  Negative for abdominal pain, diarrhea, nausea and vomiting.  Neurological:  Negative for light-headedness.  All other systems reviewed and are negative.   Physical Exam Updated Vital Signs BP 109/69   Pulse 88   Temp 97.9 F (36.6 C) (Oral)   Resp 16   SpO2 99%  Physical Exam Vitals and nursing note reviewed.  Constitutional:      General: She is not in acute distress.    Appearance: Normal appearance. She is not ill-appearing.  HENT:     Head: Normocephalic and atraumatic.     Nose: Nose normal.  Eyes:     General: No scleral icterus.    Extraocular Movements: Extraocular movements intact.     Conjunctiva/sclera: Conjunctivae normal.  Cardiovascular:     Rate and Rhythm: Normal rate and regular rhythm.     Pulses: Normal pulses.     Heart sounds: Normal heart sounds.  Pulmonary:     Effort: Pulmonary effort is normal. No respiratory distress.     Breath sounds: Normal breath sounds. No wheezing or rales.  Abdominal:     General: There is no distension.     Tenderness: There is no abdominal tenderness.  Musculoskeletal:        General: Normal range of motion.     Cervical back: Normal range of motion.     Right lower leg: No edema.     Left lower leg: No edema.  Skin:    General: Skin is warm and dry.  Neurological:     General: No focal deficit present.     Mental Status: She is alert. Mental status is at baseline.     ED Results / Procedures / Treatments   Labs (all labs ordered are listed, but only abnormal results are displayed) Labs Reviewed  CBC - Abnormal; Notable for the following components:      Result Value   Platelets 452 (*)    All other components within normal limits  BASIC METABOLIC PANEL - Abnormal; Notable for the following components:   Potassium 2.4 (*)    Chloride 96 (*)    Glucose, Bld 112 (*)    All other components within normal limits   MAGNESIUM    EKG None  Radiology No results found.  Procedures .Critical Care  Performed by: Marita Kansas, PA-C Authorized by: Marita Kansas, PA-C   Critical care provider statement:    Critical care time (minutes):  30   Critical care was necessary to treat or prevent imminent or life-threatening deterioration of the following conditions:  Metabolic crisis (hypokalemia at 2.4 requiring admission)   Critical care was time spent personally by me on the following activities:  Development of treatment plan with patient or  surrogate, discussions with consultants, evaluation of patient's response to treatment, examination of patient, ordering and review of laboratory studies, ordering and review of radiographic studies, ordering and performing treatments and interventions, pulse oximetry, re-evaluation of patient's condition and review of old charts     Medications Ordered in ED Medications  magnesium sulfate IVPB 2 g 50 mL (has no administration in time range)  potassium chloride SA (KLOR-CON M) CR tablet 40 mEq (has no administration in time range)  potassium chloride 10 mEq in 100 mL IVPB (has no administration in time range)    ED Course/ Medical Decision Making/ A&P                                 Medical Decision Making Amount and/or Complexity of Data Reviewed Labs: ordered.  Risk Prescription drug management. Decision regarding hospitalization.   72 year old female with past medical history significant as above presents today for concern of hypokalemia.  2.7 at clinic from 2 days ago.  2.4 today in the emergency department.  IV potassium and PO repletion ordered along with magnesium.  CBC is unremarkable with the exception of platelets of 452.  BMP with the exception of hypokalemia shows no acute concerns.  EKG ordered to evaluate for hyperkalemic changes.  Will discuss with internal medicine team for admission for hypokalemia and medication adjustment.  Discussed with IMTS.   They will evaluate patient and admit.`  Final Clinical Impression(s) / ED Diagnoses Final diagnoses:  Hypokalemia    Rx / DC Orders ED Discharge Orders     None         Marita Kansas, PA-C 12/01/22 1633    Lonell Grandchild, MD 12/01/22 [phone]

## 2022-12-01 NOTE — Assessment & Plan Note (Signed)
Pt's blood pressure is well controlled. It is 116/86 today. Regimen includes torsemide 60 mg every day, losartan 25 mg every day, Spironolactone 25 mg every day, toprol 25 mg every day, Jardiance 10 mg every day. Pt's daughter reported readings in the low 100s/60s. Advised this is normal blood pressure. Gave precautions to hold losartan if BP is <100/60 and call our clinic given increase in her torsemide. Pt is checking her blood pressure with a wrist cuff. I advised them to obtain a brachial blood pressure monitor given inaccuracies with wrist monitors. Will follow up on this 09/11.

## 2022-12-01 NOTE — ED Notes (Signed)
Unsuccessful IV attempt.  Will consult IV team.  °

## 2022-12-01 NOTE — ED Notes (Signed)
Placed pt on 3lmp [place]

## 2022-12-01 NOTE — Assessment & Plan Note (Signed)
Pt admitted for acute on chronic hypoxic respiratory failure secondary to volume overload. Her oxygen requirement is at baseline 2 L. Discussed all medications and their importance to her health. She has trelegy inhaler and is using it daily.

## 2022-12-02 ENCOUNTER — Other Ambulatory Visit (HOSPITAL_COMMUNITY): Payer: Self-pay

## 2022-12-02 ENCOUNTER — Encounter (HOSPITAL_COMMUNITY): Payer: Self-pay | Admitting: Internal Medicine

## 2022-12-02 LAB — BASIC METABOLIC PANEL
Anion gap: 9 (ref 5–15)
BUN: 16 mg/dL (ref 8–23)
CO2: 30 mmol/L (ref 22–32)
Calcium: 9 mg/dL (ref 8.9–10.3)
Chloride: 100 mmol/L (ref 98–111)
Creatinine, Ser: 1.02 mg/dL — ABNORMAL HIGH (ref 0.44–1.00)
GFR, Estimated: 58 mL/min — ABNORMAL LOW (ref >60)
Glucose, Bld: 132 mg/dL — ABNORMAL HIGH (ref 70–99)
Potassium: 3.3 mmol/L — ABNORMAL LOW (ref 3.5–5.1)
Sodium: 139 mmol/L (ref 135–145)

## 2022-12-02 LAB — GLUCOSE, CAPILLARY: Glucose-Capillary: 200 mg/dL — ABNORMAL HIGH (ref 70–99)

## 2022-12-02 LAB — MRSA NEXT GEN BY PCR, NASAL: MRSA by PCR Next Gen: NOT DETECTED

## 2022-12-02 MED ORDER — POTASSIUM CHLORIDE CRYS ER 20 MEQ PO TBCR
40.0000 meq | EXTENDED_RELEASE_TABLET | Freq: Every day | ORAL | 2 refills | Status: DC
  Filled 2022-12-02: qty 90, 45d supply, fill #0

## 2022-12-02 NOTE — Discharge Instructions (Signed)
Sharon Vasquez,  You were recently admitted to [facility] for low potassium. We gave you extra potassium and you responded well.    Continue taking your home medications with the following changes  Take 40 mEq of potassium chloride daily.  You should seek further medical care if you run out of any medications or have any other concerns.  We recommend that you see your primary care doctor in about a week to make sure that you continue to improve. We are so glad that you are feeling better.  Sincerely, Rocky Morel, DO

## 2022-12-02 NOTE — Assessment & Plan Note (Addendum)
-   Reinforced importance of wearing CPAP nightly, advised to resume use. Having issues with current machine. Current pressure 13cm h20; Residual AHI 12.1/hour. Needs CPAP titration study

## 2022-12-02 NOTE — Assessment & Plan Note (Addendum)
-   Stable; Continue supplemental oxygen 2L to keep O2 >88-90%

## 2022-12-02 NOTE — Plan of Care (Signed)

## 2022-12-02 NOTE — Assessment & Plan Note (Addendum)
-   Stable; Does not appear acutely exacerbated. No overt wheezing. Very occasional cough. Lungs clear on exam. Continue Trelegy 1 puff daily in the morning. Advised she take mucinex 600-1200mg  morning and evening as needed to loosen congestion and use flutter vale 2-3 times a day

## 2022-12-02 NOTE — Discharge Summary (Signed)
Name: Sharon Vasquez [name]: [id number] DOB: Oct 21, 1950 72 y.o. PCP: [name], DO  Date of Admission: 12/01/2022  1:08 PM Date of Discharge: 12/02/2022  Attending Physician: Dr. Heide Spark  DISCHARGE DIAGNOSIS:  Primary Problem: Hypokalemia  Hospital Problems: Active Problems:   Hypokalemia    DISCHARGE MEDICATIONS:   Allergies as of 12/02/2022   No Known Allergies      Medication List     TAKE these medications    albuterol 108 (90 Base) MCG/ACT inhaler Commonly known as: VENTOLIN HFA Inhale 2 puffs into the lungs every 4 (four) hours as needed for shortness of breath.   alendronate 70 MG tablet Commonly known as: FOSAMAX Take 70 mg by mouth once a week. Take with a full glass of water on an empty stomach, Patient hasn't started medication.   aspirin EC 81 MG tablet Take 81 mg by mouth daily.   atorvastatin 80 MG tablet Commonly known as: LIPITOR Take 1 tablet (80 mg total) by mouth daily.   empagliflozin 10 MG Tabs tablet Commonly known as: Jardiance Take 1 tablet (10 mg total) by mouth daily before breakfast.   esomeprazole 40 MG capsule Commonly known as: NEXIUM Take 1 capsule (40 mg total) by mouth daily as needed (for heartburn or indigestion). What changed: when to take this   gabapentin 400 MG capsule Commonly known as: Neurontin Take 1 capsule (400 mg total) by mouth 2 (two) times daily.   letrozole 2.5 MG tablet Commonly known as: FEMARA TAKE 1 TABLET(2.5 MG) BY MOUTH DAILY What changed: See the new instructions.   LORazepam 0.5 MG tablet Commonly known as: ATIVAN Take 0.5 mg by mouth daily as needed for anxiety.   losartan 25 MG tablet Commonly known as: Cozaar Take 1 tablet (25 mg total) by mouth daily.   metoprolol succinate 25 MG 24 hr tablet Commonly known as: TOPROL-XL TAKE 1 TABLET BY MOUTH DAILY   naloxone 4 MG/0.1ML Liqd nasal spray kit Commonly known as: NARCAN Place 1 spray into the nose once as needed (for overdose  suspected). for opioid overdose   oxybutynin 5 MG tablet Commonly known as: DITROPAN Take 1 tablet (5 mg total) by mouth 3 (three) times daily.   oxyCODONE 15 MG immediate release tablet Commonly known as: ROXICODONE Take 15 mg by mouth See admin instructions. Take 15mg  (1 tablet) by mouth every morning and 15mg  (1 tablet) at bedtime in addition to twice during the day as needed.   OXYGEN Inhale 2-3 L into the lungs daily.   potassium chloride SA 20 MEQ tablet Commonly known as: KLOR-CON M Take 2 tablets (40 mEq total) by mouth daily. What changed: how much to take   PRESCRIPTION MEDICATION Inhale into the lungs at bedtime. cpap   rivaroxaban 20 MG Tabs tablet Commonly known as: Xarelto Take 1 tablet (20 mg total) by mouth every morning.   torsemide 20 MG tablet Commonly known as: DEMADEX Take 3 tablets (60 mg total) by mouth daily.   Trelegy Ellipta 100-62.5-25 MCG/ACT Aepb Generic drug: Fluticasone-Umeclidin-Vilant Inhale 1 puff into the lungs daily.   Wegovy 1 MG/0.5ML Soaj Generic drug: Semaglutide-Weight Management Inject 1 mg into the skin once a week.        DISPOSITION AND FOLLOW-UP:  Sharon Vasquez was discharged from [facility] in Stable condition. At the hospital follow up visit please address:  Follow-up Recommendations: Labs: Basic Metabolic Profile Medications: Consider changes in diuretics and potassium supplement as indicated. Diuretics managed by  cardiology related to HFrEF.  At follow up, please consider evaluation of her potassium with BMP. Ensure that she is taking her diuretics and potassium supplement as prescribed. Evaluate for new changes in fluid status, respiratory status in this patient with significant HFrEF and COPD with baseline oxygen requirement. Note: she reports that she was unable to refill her [facility] medicine. We attempted to refill at [facility] pharmacy and local options but there is no supply. Please attend to this  at follow up or subsequent visit as able and appropriate.  Follow-up Appointments:  Follow-up Information     Annett Fabian, MD Follow up on 12/13/2022.   Specialty: Internal Medicine Why: at 11 AM Contact information: [address] [place] [address] [phone]                 HOSPITAL COURSE:  Patient Summary: Sharon Vasquez was admitted to [facility] hospital for hypokalemia discovered at her PCP via routine labs. She had K 2.7 at primary care and 2.4 in the ED. However, she was asymptomatic. ECG in the ED was without acute changes. She attributed the cause to a recent increase in her torsemide diuretic form 40mg  to 60mg . She states that her potassium has fluctuated over the years and has needed adjustement in the past. She is currently on of supplement. She was admitted for potassium repletion and was stable for discharge on the following morning with a potassium of 3.3 with an additional to come. Her home potassium supplement was increased to .  Hypokalemia Pt with K 2.4 in ED originally discovered at primary care. ECG was unremakrable for T wave inversions or ST depression. She was asymptomatic. She denied recent illness, vomiting, reduced oral intake. She has had changes to her diuretic regimen in the recent past - most recently increasing torsemide from 40mg  to 60mg  every day. In the ED Mg was normal. She received IV K and Mg repletion. She received further 120 mEq oral.   HFrEF without exacerbation Pt was hospitalized for acute on chronic respiratory failure secondary to volume overload over the summer. This was not an issue at this hospitalization. She was euvolemic owithout increased oxygen requirements from her baseline of 2L. Recent ECHO shows EF <20%. Her home heart failure medicines were continued: Jardiance 10mg , Metroprolol succinate XL 25mg , Aldactone 25mg , losartan 25mg . Her torsemide was held.   Persistent Atrial Fibrillation with Controlled  Ventricular Response Not an active issue at this hospitalization. Rate was irregularly irregular and controlled on her home Metroprolol succinate XL 25mg . Anticoagulation continued with her home Xarelto 20mg .   Chronic Respiratory Failure secondary to COPD Not an issue at this hospitalization. She has a history of COPD and requires 2L oxygen [place] at baseline. She was not newly hypoxic, no respiratory distress, no wheezes. Her inhalers and prn nebulizer were continued.    DISCHARGE INSTRUCTIONS:   Discharge Instructions     Call MD for:  difficulty breathing, headache or visual disturbances   Complete by: As directed    Call MD for:  extreme fatigue   Complete by: As directed    Call MD for:  persistant dizziness or light-headedness   Complete by: As directed    Call MD for:  persistant nausea and vomiting   Complete by: As directed    Call MD for:  severe uncontrolled pain   Complete by: As directed    Call MD for:  temperature >100.4   Complete by: As directed    Diet -  low sodium heart healthy   Complete by: As directed    Increase activity slowly   Complete by: As directed        SUBJECTIVE:  Pt well this morning and very ready to leave. She remains asymptomatic. She is grateful for care and agrees with increasing her oral potassium. Discharge Vitals:   BP 107/81 (BP Location: Left Arm)   Pulse 87   Temp 98.2 F (36.8 C) (Oral)   Resp 19   Ht 6\' 2"  (1.88 m)   Wt (!) 150.2 kg   SpO2 95%   BMI 42.51 kg/m   OBJECTIVE:  Physical Exam   Pertinent Labs, Studies, and Procedures:     Latest Ref Rng & Units 12/01/2022    1:27 PM 11/06/2022    7:00 AM 11/05/2022    1:43 AM  CBC  WBC 4.0 - 10.5 K/uL 6.8  7.6  7.9   Hemoglobin 12.0 - 15.0 g/dL 16.1  09.6  04.5   Hematocrit 36.0 - 46.0 % 38.3  40.1  41.0   Platelets 150 - 400 K/uL 452  483  501        Latest Ref Rng & Units 12/02/2022    2:40 AM 12/01/2022    1:27 PM 11/29/2022    3:31 PM  CMP  Glucose 70 - 99 mg/dL 409   811  914   BUN 8 - 23 mg/dL 16  14  18    Creatinine 0.44 - 1.00 mg/dL 7.82  9.56  2.13   Sodium 135 - 145 mmol/L 139  137  141   Potassium 3.5 - 5.1 mmol/L 3.3  2.4  2.7   Chloride 98 - 111 mmol/L 100  96  96   CO2 22 - 32 mmol/L 30  29  26    Calcium 8.9 - 10.3 mg/dL 9.0  9.0  08.6     Signed: Katheran James, DO Internal Medicine Resident, PGY-1 [facility]  Pager: [phone] 12:54 PM, 12/02/2022

## 2022-12-02 NOTE — Assessment & Plan Note (Signed)
-   Persistent dyspnea and chronic leg swelling - Continue Torsemide 60mg  daily and Spirnolactone 25mg  - Seeing PCP today

## 2022-12-05 DIAGNOSIS — Z7985 Long-term (current) use of injectable non-insulin antidiabetic drugs: Secondary | ICD-10-CM | POA: Diagnosis not present

## 2022-12-05 DIAGNOSIS — I959 Hypotension, unspecified: Secondary | ICD-10-CM | POA: Diagnosis not present

## 2022-12-05 DIAGNOSIS — K219 Gastro-esophageal reflux disease without esophagitis: Secondary | ICD-10-CM | POA: Diagnosis not present

## 2022-12-05 DIAGNOSIS — Z7901 Long term (current) use of anticoagulants: Secondary | ICD-10-CM | POA: Diagnosis not present

## 2022-12-05 DIAGNOSIS — J449 Chronic obstructive pulmonary disease, unspecified: Secondary | ICD-10-CM | POA: Diagnosis not present

## 2022-12-05 DIAGNOSIS — I2510 Atherosclerotic heart disease of native coronary artery without angina pectoris: Secondary | ICD-10-CM | POA: Diagnosis not present

## 2022-12-05 DIAGNOSIS — Z79891 Long term (current) use of opiate analgesic: Secondary | ICD-10-CM | POA: Diagnosis not present

## 2022-12-05 DIAGNOSIS — I700 Atherosclerosis of aorta: Secondary | ICD-10-CM | POA: Diagnosis not present

## 2022-12-05 DIAGNOSIS — I5023 Acute on chronic systolic (congestive) heart failure: Secondary | ICD-10-CM | POA: Diagnosis not present

## 2022-12-05 DIAGNOSIS — J9622 Acute and chronic respiratory failure with hypercapnia: Secondary | ICD-10-CM | POA: Diagnosis not present

## 2022-12-05 DIAGNOSIS — I4891 Unspecified atrial fibrillation: Secondary | ICD-10-CM | POA: Diagnosis not present

## 2022-12-05 DIAGNOSIS — I272 Pulmonary hypertension, unspecified: Secondary | ICD-10-CM | POA: Diagnosis not present

## 2022-12-05 DIAGNOSIS — E785 Hyperlipidemia, unspecified: Secondary | ICD-10-CM | POA: Diagnosis not present

## 2022-12-05 DIAGNOSIS — K5900 Constipation, unspecified: Secondary | ICD-10-CM | POA: Diagnosis not present

## 2022-12-05 DIAGNOSIS — J9621 Acute and chronic respiratory failure with hypoxia: Secondary | ICD-10-CM | POA: Diagnosis not present

## 2022-12-05 DIAGNOSIS — G890 Central pain syndrome: Secondary | ICD-10-CM | POA: Diagnosis not present

## 2022-12-05 DIAGNOSIS — E119 Type 2 diabetes mellitus without complications: Secondary | ICD-10-CM | POA: Diagnosis not present

## 2022-12-05 DIAGNOSIS — M810 Age-related osteoporosis without current pathological fracture: Secondary | ICD-10-CM | POA: Diagnosis not present

## 2022-12-05 DIAGNOSIS — G4733 Obstructive sleep apnea (adult) (pediatric): Secondary | ICD-10-CM | POA: Diagnosis not present

## 2022-12-06 ENCOUNTER — Ambulatory Visit: Payer: Self-pay

## 2022-12-06 ENCOUNTER — Telehealth: Payer: Self-pay | Admitting: *Deleted

## 2022-12-06 NOTE — Patient Instructions (Signed)
Visit Information  Thank you for taking time to visit with me today. Please don't hesitate to contact me if I can be of assistance to you.   Following are the goals we discussed today:  - Remain engaged with home health  Our next appointment is by telephone on 9/9 at 11:30  Please call the care guide team at [phone] if you need to cancel or reschedule your appointment.   If you are experiencing a Mental Health or Behavioral Health Crisis or need someone to talk to, please call 1-800-273-TALK (toll free, 24 hour hotline) go to [facility] [address], [place] [phone]) call 911  Patient verbalizes understanding of instructions and care plan provided today and agrees to view in MyChart. Active MyChart status and patient understanding of how to access instructions and care plan via MyChart confirmed with patient.     Bevelyn Ngo, BSW, CDP Social Worker, Certified Dementia Practitioner [facility] Care Management  Care Coordination [phone]

## 2022-12-06 NOTE — Patient Outreach (Signed)
  Care Coordination   Follow Up Visit Note   12/06/2022 Name: BRITNE BEUS [name]: [id number] DOB: 1950-07-25  HADIL DEMIRJIAN is a 72 y.o. year old female who sees Atway, Rayann N, DO for primary care. I spoke with  [name] by phone today.  What matters to the patients health and wellness today?  SW would like to obtain post acute meals.    Goals Addressed             This Visit's Progress    Care Coordination Activities       Care Coordination Interventions: Discussed the patient has returned home from a recent inpatient stay and is awaiting meals from her health plan. Patient reports she has been contacted by her health plan regarding meals Scheduled follow up call for SW to confirm receipt of meals  Education provided on the role of the RN Care Manager  - telephonic visit scheduled for 9/12 at 11am Reviewed patient still without a scale - SW outreach to resources to determine if scales are available for patient        SDOH assessments and interventions completed:  No     Care Coordination Interventions:  Yes, provided   Interventions Today    Flowsheet Row Most Recent Value  Chronic Disease   Chronic disease during today's visit Hypertension (HTN)  General Interventions   General Interventions Discussed/Reviewed General Interventions Reviewed, Referral to Nurse        Follow up plan: Follow up call scheduled for 9/9    Encounter Outcome:  Patient Visit Completed   Bevelyn Ngo, Kenard Gower, CDP Social Worker, Certified Dementia Practitioner [facility]  Care Coordination [phone]

## 2022-12-06 NOTE — Telephone Encounter (Signed)
Patient called asking about when she needs to come in for follow up on her low Potassium level.  Will forward to PCP and team to see when patient needs to come in and get orders placed for

## 2022-12-07 ENCOUNTER — Encounter (HOSPITAL_COMMUNITY): Payer: Self-pay

## 2022-12-07 ENCOUNTER — Other Ambulatory Visit: Payer: Self-pay | Admitting: Internal Medicine

## 2022-12-07 ENCOUNTER — Ambulatory Visit (HOSPITAL_COMMUNITY)
Admission: RE | Admit: 2022-12-07 | Discharge: 2022-12-07 | Disposition: A | Discharge status: Home / Self Care | Payer: 59 | Source: Ambulatory Visit | Attending: Physician Assistant | Admitting: Physician Assistant

## 2022-12-07 VITALS — BP 122/80 | HR 93 | Ht 74.0 in | Wt 350.0 lb

## 2022-12-07 DIAGNOSIS — G890 Central pain syndrome: Secondary | ICD-10-CM | POA: Diagnosis not present

## 2022-12-07 DIAGNOSIS — Z79899 Other long term (current) drug therapy: Secondary | ICD-10-CM | POA: Insufficient documentation

## 2022-12-07 DIAGNOSIS — J449 Chronic obstructive pulmonary disease, unspecified: Secondary | ICD-10-CM | POA: Diagnosis not present

## 2022-12-07 DIAGNOSIS — K5900 Constipation, unspecified: Secondary | ICD-10-CM | POA: Diagnosis not present

## 2022-12-07 DIAGNOSIS — I700 Atherosclerosis of aorta: Secondary | ICD-10-CM | POA: Diagnosis not present

## 2022-12-07 DIAGNOSIS — J9611 Chronic respiratory failure with hypoxia: Secondary | ICD-10-CM | POA: Insufficient documentation

## 2022-12-07 DIAGNOSIS — I482 Chronic atrial fibrillation, unspecified: Secondary | ICD-10-CM

## 2022-12-07 DIAGNOSIS — K219 Gastro-esophageal reflux disease without esophagitis: Secondary | ICD-10-CM | POA: Diagnosis not present

## 2022-12-07 DIAGNOSIS — Z7901 Long term (current) use of anticoagulants: Secondary | ICD-10-CM | POA: Diagnosis not present

## 2022-12-07 DIAGNOSIS — I428 Other cardiomyopathies: Secondary | ICD-10-CM

## 2022-12-07 DIAGNOSIS — I5040 Unspecified combined systolic (congestive) and diastolic (congestive) heart failure: Secondary | ICD-10-CM | POA: Diagnosis not present

## 2022-12-07 DIAGNOSIS — Z7984 Long term (current) use of oral hypoglycemic drugs: Secondary | ICD-10-CM | POA: Diagnosis not present

## 2022-12-07 DIAGNOSIS — E876 Hypokalemia: Secondary | ICD-10-CM

## 2022-12-07 DIAGNOSIS — E669 Obesity, unspecified: Secondary | ICD-10-CM | POA: Diagnosis not present

## 2022-12-07 DIAGNOSIS — Z6841 Body Mass Index (BMI) 40.0 and over, adult: Secondary | ICD-10-CM | POA: Insufficient documentation

## 2022-12-07 DIAGNOSIS — I4891 Unspecified atrial fibrillation: Secondary | ICD-10-CM | POA: Diagnosis not present

## 2022-12-07 DIAGNOSIS — I5023 Acute on chronic systolic (congestive) heart failure: Secondary | ICD-10-CM | POA: Diagnosis not present

## 2022-12-07 DIAGNOSIS — J9621 Acute and chronic respiratory failure with hypoxia: Secondary | ICD-10-CM | POA: Diagnosis not present

## 2022-12-07 DIAGNOSIS — F1010 Alcohol abuse, uncomplicated: Secondary | ICD-10-CM | POA: Insufficient documentation

## 2022-12-07 DIAGNOSIS — E119 Type 2 diabetes mellitus without complications: Secondary | ICD-10-CM | POA: Diagnosis not present

## 2022-12-07 DIAGNOSIS — I110 Hypertensive heart disease with heart failure: Secondary | ICD-10-CM | POA: Diagnosis not present

## 2022-12-07 DIAGNOSIS — I272 Pulmonary hypertension, unspecified: Secondary | ICD-10-CM | POA: Diagnosis not present

## 2022-12-07 DIAGNOSIS — I2510 Atherosclerotic heart disease of native coronary artery without angina pectoris: Secondary | ICD-10-CM | POA: Insufficient documentation

## 2022-12-07 DIAGNOSIS — G4733 Obstructive sleep apnea (adult) (pediatric): Secondary | ICD-10-CM | POA: Diagnosis not present

## 2022-12-07 DIAGNOSIS — I959 Hypotension, unspecified: Secondary | ICD-10-CM | POA: Diagnosis not present

## 2022-12-07 DIAGNOSIS — M810 Age-related osteoporosis without current pathological fracture: Secondary | ICD-10-CM | POA: Diagnosis not present

## 2022-12-07 DIAGNOSIS — J9622 Acute and chronic respiratory failure with hypercapnia: Secondary | ICD-10-CM | POA: Diagnosis not present

## 2022-12-07 DIAGNOSIS — Z9981 Dependence on supplemental oxygen: Secondary | ICD-10-CM | POA: Diagnosis not present

## 2022-12-07 DIAGNOSIS — I5020 Unspecified systolic (congestive) heart failure: Secondary | ICD-10-CM

## 2022-12-07 DIAGNOSIS — Z79891 Long term (current) use of opiate analgesic: Secondary | ICD-10-CM | POA: Diagnosis not present

## 2022-12-07 DIAGNOSIS — E785 Hyperlipidemia, unspecified: Secondary | ICD-10-CM | POA: Diagnosis not present

## 2022-12-07 DIAGNOSIS — Z7985 Long-term (current) use of injectable non-insulin antidiabetic drugs: Secondary | ICD-10-CM | POA: Diagnosis not present

## 2022-12-07 LAB — COMPREHENSIVE METABOLIC PANEL
ALT: 17 U/L (ref 0–44)
AST: 17 U/L (ref 15–41)
Albumin: 3.6 g/dL (ref 3.5–5.0)
Alkaline Phosphatase: 94 U/L (ref 38–126)
Anion gap: 10 (ref 5–15)
BUN: 15 mg/dL (ref 8–23)
CO2: 26 mmol/L (ref 22–32)
Calcium: 8.9 mg/dL (ref 8.9–10.3)
Chloride: 102 mmol/L (ref 98–111)
Creatinine, Ser: 1 mg/dL (ref 0.44–1.00)
GFR, Estimated: 59 mL/min — ABNORMAL LOW (ref >60)
Glucose, Bld: 120 mg/dL — ABNORMAL HIGH (ref 70–99)
Potassium: 3 mmol/L — ABNORMAL LOW (ref 3.5–5.1)
Sodium: 138 mmol/L (ref 135–145)
Total Bilirubin: 0.6 mg/dL (ref 0.3–1.2)
Total Protein: 7.1 g/dL (ref 6.5–8.1)

## 2022-12-07 LAB — BRAIN NATRIURETIC PEPTIDE: B Natriuretic Peptide: 95.9 pg/mL (ref 0.0–100.0)

## 2022-12-07 MED ORDER — SPIRONOLACTONE 25 MG PO TABS
12.5000 mg | ORAL_TABLET | Freq: Every day | ORAL | 3 refills | Status: DC
Start: 2022-12-07 — End: 2022-12-22

## 2022-12-07 NOTE — Patient Instructions (Signed)
Start Spiro 12.5 mg daily - Rx sent to pharmacy. Labs today - will call you if abnormal. Repeat lab in one week - see below. Please follow up with Dr. Hazle Coca office - see below. Return to Heart Failure [facility] Clinic in 8 weeks - see below. Please call us at [phone] if any questions or concerns prior to your next visit.

## 2022-12-07 NOTE — Progress Notes (Addendum)
HEART & VASCULAR TRANSITION OF CARE CONSULT NOTE     Referring Physician:Dr Hoffman  Primary Care: Dr Ned Card  Primary Cardiologist: Dr Allyson Sabal  Oncology: Dr Pamelia Hoit   HPI: Referred to clinic by Dr Mikey Bussing for heart failure consultation.   Sharon Vasquez is a 72 year old with a history of HFpEF, chronic respiratory failure on home O2, COPD, chronic Afib, severe OSA (not currently treated), GERD, morbid obesity, hx PE/DVT, tobacco use and breast cancer.    Admitted 01/24 with acute on chronic hypoxic respiratory failure 2/2 hypertensive urgency and acute on chronic HFpEF. She was diuresed and home meds restarted. Apparently had not been taking medications consistently.  Seen in ED 07/04 with elevated blood pressure, lower extremity edema and shortness of breath. There was concern for medication adherence. She was given IV lasix and discharged.   Admitted 10/28/22 with acute hypoxic respiratory failure requiring intubation. She was in atrial fibrillation with RVR on presentation. Hospital course c/b respiratory arrest due to mucus plug and new acute HFrEF.  Echo showed EF 20% with WMA in LAD territory, possibly due to stress cardiomyopathy. Cath with minimal disease. She was diuresed and started on GDMT. SGLT2 not started d/t UTI.  Seen in [facility] for hospital follow-up 11/16/22. She was volume up. Torsemide increased to 60 mg daily. She was started on Jardiance.  She was readmitted 08/30-08/31/24 for severe hypokalemia. Potassium was 2.4. She received IV supplementation. Discharged on 40 mEq K daily. Spironolactone 25 mg daily had been on her medication list prior to admission but she never started the medication. She states the pharmacy never filled it for her.  Here today for follow-up. Uses an electric wheelchair to get around. Does not have a scale to weight at home. Her chronic dyspnea is stable. Uses 2L O2 at baseline. Has chronic lower extremity swelling. Does not elevate her feet during the  day. She has venous insufficiency and states UNNA boots will be applied by her PCP's office. Not using CPAP, machine is broken. Awaiting titration study. Reports following fluid restriction. Does not use salt shaker but reports she does eat frozen meals (chicken pot pies) sometimes.   Discharged from SNF to home about 2-3 weeks ago. Has home PT and OT. 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    Cardiac Testing  Cath 11/01/22    Prox LAD lesion is 35% stenosed.   LV end diastolic pressure is normal.   Hemodynamic findings consistent with mild pulmonary hypertension.  Mild nonobstructive CAD Normal LV filling pressures. PCWP 19/22 with mean 16 mm Hg. LVEDP 14 mm Hg Mild pulmonary HTN PAP 40/20 with mean 28 mm Hg Normal RA pressure 9 mm Hg Normal Cardiac output 8.72 L/min, index 3.21  Echo 10/29/22   1. LV septum is aneurysmal towards the apex. Left ventricular ejection  fraction, by estimation, is <20%. The left ventricle has severely  decreased function. The left ventricle demonstrates global hypokinesis.  There is mild left ventricular hypertrophy.  Left ventricular diastolic parameters are indeterminate.   2. Right ventricular systolic function is normal. The right ventricular  size is normal. There is normal pulmonary artery systolic pressure.   3. Left atrial size was moderately dilated.   4. The mitral valve is normal in structure. No evidence of mitral valve  regurgitation.   5. The aortic valve is tricuspid. Aortic valve regurgitation is not  visualized.   6. There is mild dilatation of the ascending aorta, measuring 41 mm.   Echo  04/2022   1. Left ventricular ejection fraction, by estimation, is 60 to 65%. The  left ventricle has normal function. The left ventricle has no regional  wall motion abnormalities. Left ventricular diastolic parameters are  indeterminate.   2. Right ventricular systolic function is normal. The right ventricular  size is normal. There is mildly  elevated pulmonary artery systolic  pressure. The estimated right ventricular systolic pressure is 36.5 mmHg.   3. No evidence of mitral valve regurgitation.     Past Medical History:  Diagnosis Date   A-fib (HCC)    Acute hypoxemic respiratory failure (HCC) 02/20/2022   Acute on chronic hypoxic respiratory failure (HCC) 01/04/2022   Acute upper respiratory infection 01/08/2022   Anxiety    Arthritis    "qwhre; joints, back" (04/17/2017)   Atrial fibrillation (HCC)    Benign breast cyst in female, left 01/08/2017   Found by Screening mammogram, evaluated by U/S on 01/08/17 and determined to be a benign simple breast cyst.   Breast cancer (HCC)    Cellulitis of left lower leg 05/30/2017   CHF (congestive heart failure) (HCC)    Chronic low back pain 08/21/2016   Chronic lower back pain    Chronic venous insufficiency    /notes 05/30/2017   COPD (chronic obstructive pulmonary disease) (HCC)    Depression    Diabetes (HCC) 01/08/2022   Diabetes mellitus without complication (HCC)    DVT (deep venous thrombosis) (HCC) 11/16/2016   Dysrhythmia    Esophageal dysmotility 11/10/2019   Previous workup by ENT with fiberoptic leryngoscopy which was normal. Dx with laryngeal pharyngeal reflux.   GERD (gastroesophageal reflux disease)    Headache    "weekly for the last 3 months" (04/17/2017)   History of pulmonary embolism    Pulmonary embolism   Hyperlipidemia    Hypertension    Laryngopharyngeal reflux 12/18/2015   Morbid obesity (HCC)    PE (pulmonary embolism)    Pulmonary embolism (HCC) 09/21/2014   Sleep apnea    Stroke (HCC) 12/02/2021    Current Outpatient Medications  Medication Sig Dispense Refill   alendronate (FOSAMAX) 70 MG tablet Take 70 mg by mouth once a week. Take with a full glass of water on an empty stomach, Patient hasn't started medication.     aspirin EC 81 MG tablet Take 81 mg by mouth daily.     atorvastatin (LIPITOR) 80 MG tablet Take 1 tablet (80 mg  total) by mouth daily. 100 tablet 2   empagliflozin (JARDIANCE) 10 MG TABS tablet Take 1 tablet (10 mg total) by mouth daily before breakfast. 30 tablet 2   esomeprazole (NEXIUM) 40 MG capsule Take 1 capsule (40 mg total) by mouth daily as needed (for heartburn or indigestion). (Patient taking differently: Take 40 mg by mouth daily.) 90 capsule 1   Fluticasone-Umeclidin-Vilant (TRELEGY ELLIPTA) 100-62.5-25 MCG/ACT AEPB Inhale 1 puff into the lungs daily. 60 each 5   gabapentin (NEURONTIN) 400 MG capsule Take 1 capsule (400 mg total) by mouth 2 (two) times daily. 60 capsule 2   letrozole (FEMARA) 2.5 MG tablet TAKE 1 TABLET(2.5 MG) BY MOUTH DAILY (Patient taking differently: Take 2.5 mg by mouth daily. TAKE 1 TABLET(2.5 MG) BY MOUTH DAILY) 90 tablet 3   losartan (COZAAR) 25 MG tablet Take 1 tablet (25 mg total) by mouth daily. (Patient taking differently: Take 25 mg by mouth daily. Taking 12.5 mg if BP low) 30 tablet 11   metoprolol succinate (TOPROL-XL) 25 MG 24 hr tablet  TAKE 1 TABLET BY MOUTH DAILY 100 tablet 2   oxybutynin (DITROPAN) 5 MG tablet Take 1 tablet (5 mg total) by mouth 3 (three) times daily.     oxyCODONE (ROXICODONE) 15 MG immediate release tablet Take 15 mg by mouth See admin instructions. Take 15mg  (1 tablet) by mouth every morning and 15mg  (1 tablet) at bedtime in addition to twice during the day as needed.     OXYGEN Inhale 2-3 L into the lungs daily.     potassium chloride SA (KLOR-CON M) 20 MEQ tablet Take 2 tablets (40 mEq total) by mouth daily. (Patient taking differently: Take 40 mEq by mouth daily. Pt taking 60 meq daily) 90 tablet 2   rivaroxaban (XARELTO) 20 MG TABS tablet Take 1 tablet (20 mg total) by mouth every morning. 90 tablet 3   Semaglutide-Weight Management (WEGOVY) 1 MG/0.5ML SOAJ Inject 1 mg into the skin once a week. 2 mL 3   spironolactone (ALDACTONE) 25 MG tablet Take 0.5 tablets (12.5 mg total) by mouth daily. 45 tablet 3   torsemide (DEMADEX) 20 MG tablet  Take 3 tablets (60 mg total) by mouth daily. 90 tablet 2   albuterol (VENTOLIN HFA) 108 (90 Base) MCG/ACT inhaler Inhale 2 puffs into the lungs every 4 (four) hours as needed for shortness of breath. (Patient not taking: Reported on 12/07/2022) 18 g 1   LORazepam (ATIVAN) 0.5 MG tablet Take 0.5 mg by mouth daily as needed for anxiety. (Patient not taking: Reported on 12/07/2022)     naloxone [facility]) nasal spray 4 mg/0.1 mL Place 1 spray into the nose once as needed (for overdose suspected). for opioid overdose (Patient not taking: Reported on 12/07/2022)     PRESCRIPTION MEDICATION Inhale into the lungs at bedtime. cpap     No current facility-administered medications for this encounter.    No Known Allergies    Social History   Socioeconomic History   Marital status: Divorced    Spouse name: Not on file   Number of children: Not on file   Years of education: Not on file   Highest education level: Not on file  Occupational History   Not on file  Tobacco Use   Smoking status: Former    Current packs/day: 0.00    Average packs/day: 2.0 packs/day for 60.4 years (120.8 ttl pk-yrs)    Types: Cigarettes    Start date: 08/16/1961    Quit date: 01/02/2022    Years since quitting: 0.9   Smokeless tobacco: Never   Tobacco comments:    Pt smokes about 4 ciggs daily. Stopped 01/02/2022   Vaping Use   Vaping status: Never Used  Substance and Sexual Activity   Alcohol use: No   Drug use: No   Sexual activity: Not Currently    Partners: Male  Other Topics Concern   Not on file  Social History Narrative   Lives in [facility] in [place]. Lives alone, but is dependent in ADLs/IADLs. Previously had HH PT and RN but dismissed them with plans to use the [employer]. Two daughters live nearby. Previously resided in [place].   Social Determinants of Health   Financial Resource Strain: Medium Risk (11/16/2022)   Overall Financial Resource Strain (CARDIA)    Difficulty of Paying Living  Expenses: Somewhat hard  Food Insecurity: No Food Insecurity (12/01/2022)   Hunger Vital Sign    Worried About Running Out of Food in the Last Year: Never true    Ran Out of Food  in the Last Year: Never true  Transportation Needs: No Transportation Needs (12/01/2022)   PRAPARE - Administrator, Civil Service (Medical): No    Lack of Transportation (Non-Medical): No  Physical Activity: Inactive (11/16/2022)   Exercise Vital Sign    Days of Exercise per Week: 0 days    Minutes of Exercise per Session: 0 min  Stress: No Stress Concern Present (12/02/2021)   [employer] of Occupational Health - Occupational Stress Questionnaire    Feeling of Stress : Only a little  Social Connections: Moderately Integrated (03/15/2022)   Social Connection and Isolation Panel [NHANES]    Frequency of Communication with Friends and Family: More than three times a week    Frequency of Social Gatherings with Friends and Family: More than three times a week    Attends Religious Services: More than 4 times per year    Active Member of [employer] or Organizations: Yes    Attends Engineer, structural: More than 4 times per year    Marital Status: Divorced  Intimate Partner Violence: Not At Risk (12/01/2022)   Humiliation, Afraid, Rape, and Kick questionnaire    Fear of Current or Ex-Partner: No    Emotionally Abused: No    Physically Abused: No    Sexually Abused: No      Family History  Problem Relation Age of Onset   Breast cancer Mother        before age 63   Hypertension Mother    Hyperlipidemia Mother    Hypertension Maternal Grandfather    Hyperlipidemia Maternal Grandfather     Vitals:   12/07/22 1123  BP: 122/80  Pulse: 93  SpO2: 96%  Weight: (!) 158.8 kg (350 lb)  Height: 6\' 2"  (1.88 m)    Wt Readings from Last 3 Encounters:  12/07/22 (!) 158.8 kg (350 lb)  12/01/22 (!) 150.2 kg (331 lb 2.1 oz)  11/29/22 (!) 153.7 kg (338 lb 12.8 oz)     PHYSICAL EXAM: General:   Chronically ill appearing. Arrived in electric wheelchair HEENT:poor dentition Neck: supple. Jvp 7-8. Carotids 2+ bilat; no bruits.  Cor: PMI nondisplaced. Irregular rate & rhythm. No rubs, gallops or murmurs. Lungs: clear Abdomen: obese, soft, nontender, nondistended.  Extremities: no cyanosis, clubbing, rash, 1-2+ edema with venous stasis changes Neuro: alert & orientedx3. Affect pleasant   ECG: A fib 79 bpm   ASSESSMENT & PLAN: 1. HFrEF, NICM  - Echo 07/24: EF < 20%. WMA in LAD territory. RV normal (EF 60-65% in 01/24). - Etiology unclear but possible stress cardiomyopathy. Cath with minimal CAD. CO 8.7 CI 3.2  -There has been concern for compliance with medications. ? If this has contributed to decompensations over the last 6 months.  NYHA III Diuretic- Volume status looks okay. Continue Torsemide 60 mg daily. Gifted home scale so she can weigh at home (if able to stand). I'm not sure that her weight in clinic today is accurate. Had significant difficulty standing on the scale (doubt 20 lb weight gain in 6 days). BB- Continue Toprol XL 25 mg daily.  Ace/ARB/ARNI- Continue losartan 25 mg daily  MRA- She never started spironolactone. It is now off her med list. Start spiro 12.5 mg daily.  SGLT2i-  Continue Jardiance 10 mg daily, watch for recurrent UTIs -Reiterated low salt food choices and limiting fluid intake to < 2 liters daily.  -Repeat ECHO 3 months (around  October 2024) -CMET/BNP today, BMET in 1 week  2. A fib ,  chronic  -She has been in A fib on EKG dating back to 2022.  -Rate controlled. Continue current dose of Toprol XL.  -Continue xarelto.  3. Obesity  Body mass index is 44.94 kg/m. On Wegovy   4. Chronic Hypoxic Respiratory Failure on home oxygen.  -On 2 liters [place]. Sats stable.   5. OSA, severe -Not currently on CPAP -Planning for CPAP titration study with Pulmonary  6. Hypokalemia -Improved. K 3.3 on 08/31 -Continue potassium chloride 40 mEq daily for  now -Start spiro 12.5 mg daily -Labs today  I am concerned about her adherence with medications. There was also question if she was taking medications correctly during prior hospitalizations today. We reviewed her cardiac medications today. She seems to be taking them correctly. Reviewed instructions for today's medication changes in detail. If compliance remains an issue, will need to consider referral for Paramedicine    Referred to HFSW (PCP, Medications, Transportation, ETOH Abuse, Drug Abuse, Insurance, Financial ): Yes, assess SDOH. Worry if she may need more support at home.  Refer to Pharmacy: No Refer to Home Health:  No Refer to Advanced Heart Failure Clinic: Pending follow-up Refer to General Cardiology: yes, follow-up with Dr. Allyson Sabal (established patient)  Follow up: 8 weeks in [facility] clinic.  If EF recovered on echo and she is able to demonstrate medication compliance, she can follow-up PRN  Shaneka Efaw N PA-C 12:19 PM

## 2022-12-08 ENCOUNTER — Telehealth (HOSPITAL_COMMUNITY): Payer: Self-pay

## 2022-12-08 ENCOUNTER — Other Ambulatory Visit: Payer: 59

## 2022-12-08 DIAGNOSIS — I5032 Chronic diastolic (congestive) heart failure: Secondary | ICD-10-CM

## 2022-12-08 MED ORDER — POTASSIUM CHLORIDE CRYS ER 20 MEQ PO TBCR: 60.0000 meq | EXTENDED_RELEASE_TABLET | Freq: Every day | ORAL | 2 refills | Status: DC

## 2022-12-08 NOTE — Telephone Encounter (Signed)
-----   Message from [facility] [place] N sent at 12/08/2022  6:55 AM EDT ----- Her potassium is low again. I worry she may not be taking medications correctly. I started spironolactone 12.5 mg daily yesterday (please make sure she picks this up). Increase potassium from 40 mEq daily to 60 mEq daily. Repeat BMET next week as planned.

## 2022-12-08 NOTE — Telephone Encounter (Signed)
Patient advised and verbalized understanding. Patient will keep pending lab appt on 12/13/22 and med list updated to reflect changes.   Meds ordered this encounter  Medications   potassium chloride SA (KLOR-CON M) 20 MEQ tablet    Sig: Take 3 tablets (60 mEq total) by mouth daily.    Dispense:  90 tablet    Refill:  2    Please cancel all previous orders for current medication. Change in dosage or pill size.

## 2022-12-11 ENCOUNTER — Ambulatory Visit: Payer: Self-pay

## 2022-12-11 NOTE — Patient Instructions (Signed)
Visit Information  Thank you for taking time to visit with me today. Please don't hesitate to contact me if I can be of assistance to you.   Following are the goals we discussed today:  - Weigh self daily using scale and record - Contact your primary care provider as needed  Your next appointment is by telephone on 9/12 at 11:00 with RN Care Manager  Please call the care guide team at [phone] if you need to cancel or reschedule your appointment.   If you are experiencing a Mental Health or Behavioral Health Crisis or need someone to talk to, please call 1-800-273-TALK (toll free, 24 hour hotline) go to [facility] [address], [place] [phone]) call 911  Patient verbalizes understanding of instructions and care plan provided today and agrees to view in MyChart. Active MyChart status and patient understanding of how to access instructions and care plan via MyChart confirmed with patient.     No further follow up required: Please contact me as needed  Bevelyn Ngo, BSW, CDP Social Worker, Certified Dementia Practitioner [facility] Care Management  Care Coordination [phone]

## 2022-12-11 NOTE — Patient Outreach (Signed)
  Care Coordination   Follow Up Visit Note   12/11/2022 Name: Sharon Vasquez [name]: [id number] DOB: 04/07/1950  Sharon Vasquez is a 72 y.o. year old female who sees Atway, Rayann N, DO for primary care. I spoke with  Sharon Vasquez by phone today.  What matters to the patients health and wellness today?  Patient reports need to weigh self daily   Goals Addressed             This Visit's Progress    COMPLETED: Care Coordination Activities       Care Coordination Interventions: Determined the patient has received post acute meals Education provided to the patient on the opportunity to utilize over the counter benefit to obtain a scale Discussed the patient has used her Ucard to purchase a scale and is now able to weigh herself daily Reminded the patient of upcoming telephonic visit with RN Care Manager on 9/12        SDOH assessments and interventions completed:  No     Care Coordination Interventions:  Yes, provided   Interventions Today    Flowsheet Row Most Recent Value  Chronic Disease   Chronic disease during today's visit Hypertension (HTN)  General Interventions   General Interventions Discussed/Reviewed General Interventions Reviewed        Follow up plan: No further intervention required. Follow up call scheduled for 9/12 with RN Care Manager    Encounter Outcome:  Patient Visit Completed   Bevelyn Ngo, Kenard Gower, CDP Social Worker, Certified Dementia Practitioner [facility] Care Management  Care Coordination [phone]

## 2022-12-12 DIAGNOSIS — I700 Atherosclerosis of aorta: Secondary | ICD-10-CM | POA: Diagnosis not present

## 2022-12-12 DIAGNOSIS — Z7985 Long-term (current) use of injectable non-insulin antidiabetic drugs: Secondary | ICD-10-CM | POA: Diagnosis not present

## 2022-12-12 DIAGNOSIS — I5023 Acute on chronic systolic (congestive) heart failure: Secondary | ICD-10-CM | POA: Diagnosis not present

## 2022-12-12 DIAGNOSIS — I959 Hypotension, unspecified: Secondary | ICD-10-CM | POA: Diagnosis not present

## 2022-12-12 DIAGNOSIS — I2510 Atherosclerotic heart disease of native coronary artery without angina pectoris: Secondary | ICD-10-CM | POA: Diagnosis not present

## 2022-12-12 DIAGNOSIS — K5900 Constipation, unspecified: Secondary | ICD-10-CM | POA: Diagnosis not present

## 2022-12-12 DIAGNOSIS — G4733 Obstructive sleep apnea (adult) (pediatric): Secondary | ICD-10-CM | POA: Diagnosis not present

## 2022-12-12 DIAGNOSIS — J9621 Acute and chronic respiratory failure with hypoxia: Secondary | ICD-10-CM | POA: Diagnosis not present

## 2022-12-12 DIAGNOSIS — J449 Chronic obstructive pulmonary disease, unspecified: Secondary | ICD-10-CM | POA: Diagnosis not present

## 2022-12-12 DIAGNOSIS — E785 Hyperlipidemia, unspecified: Secondary | ICD-10-CM | POA: Diagnosis not present

## 2022-12-12 DIAGNOSIS — E119 Type 2 diabetes mellitus without complications: Secondary | ICD-10-CM | POA: Diagnosis not present

## 2022-12-12 DIAGNOSIS — I4891 Unspecified atrial fibrillation: Secondary | ICD-10-CM | POA: Diagnosis not present

## 2022-12-12 DIAGNOSIS — I272 Pulmonary hypertension, unspecified: Secondary | ICD-10-CM | POA: Diagnosis not present

## 2022-12-12 DIAGNOSIS — G890 Central pain syndrome: Secondary | ICD-10-CM | POA: Diagnosis not present

## 2022-12-12 DIAGNOSIS — M810 Age-related osteoporosis without current pathological fracture: Secondary | ICD-10-CM | POA: Diagnosis not present

## 2022-12-12 DIAGNOSIS — Z7901 Long term (current) use of anticoagulants: Secondary | ICD-10-CM | POA: Diagnosis not present

## 2022-12-12 DIAGNOSIS — K219 Gastro-esophageal reflux disease without esophagitis: Secondary | ICD-10-CM | POA: Diagnosis not present

## 2022-12-12 DIAGNOSIS — Z79891 Long term (current) use of opiate analgesic: Secondary | ICD-10-CM | POA: Diagnosis not present

## 2022-12-12 DIAGNOSIS — J9622 Acute and chronic respiratory failure with hypercapnia: Secondary | ICD-10-CM | POA: Diagnosis not present

## 2022-12-13 ENCOUNTER — Encounter: Payer: Self-pay | Admitting: Student

## 2022-12-13 ENCOUNTER — Ambulatory Visit (HOSPITAL_COMMUNITY)
Admission: RE | Admit: 2022-12-13 | Discharge: 2022-12-13 | Disposition: A | Discharge status: Home / Self Care | Payer: 59 | Source: Ambulatory Visit | Attending: Internal Medicine | Admitting: Internal Medicine

## 2022-12-13 ENCOUNTER — Ambulatory Visit (INDEPENDENT_AMBULATORY_CARE_PROVIDER_SITE_OTHER): Payer: 59 | Admitting: Student

## 2022-12-13 VITALS — BP 119/73 | HR 96 | Temp 98.0°F | Resp 20 | Ht 74.0 in

## 2022-12-13 DIAGNOSIS — J9611 Chronic respiratory failure with hypoxia: Secondary | ICD-10-CM | POA: Diagnosis not present

## 2022-12-13 DIAGNOSIS — I5020 Unspecified systolic (congestive) heart failure: Secondary | ICD-10-CM

## 2022-12-13 DIAGNOSIS — Z8639 Personal history of other endocrine, nutritional and metabolic disease: Secondary | ICD-10-CM

## 2022-12-13 DIAGNOSIS — I110 Hypertensive heart disease with heart failure: Secondary | ICD-10-CM

## 2022-12-13 DIAGNOSIS — I10 Essential (primary) hypertension: Secondary | ICD-10-CM

## 2022-12-13 DIAGNOSIS — I5032 Chronic diastolic (congestive) heart failure: Secondary | ICD-10-CM | POA: Diagnosis not present

## 2022-12-13 DIAGNOSIS — I4891 Unspecified atrial fibrillation: Secondary | ICD-10-CM | POA: Diagnosis not present

## 2022-12-13 LAB — BASIC METABOLIC PANEL
Anion gap: 10 (ref 5–15)
BUN: 11 mg/dL (ref 8–23)
CO2: 27 mmol/L (ref 22–32)
Calcium: 9.5 mg/dL (ref 8.9–10.3)
Chloride: 104 mmol/L (ref 98–111)
Creatinine, Ser: 0.97 mg/dL (ref 0.44–1.00)
GFR, Estimated: >60 mL/min (ref >60)
Glucose, Bld: 95 mg/dL (ref 70–99)
Potassium: 3 mmol/L — ABNORMAL LOW (ref 3.5–5.1)
Sodium: 141 mmol/L (ref 135–145)

## 2022-12-13 NOTE — Patient Instructions (Signed)
Thank you, [name] for allowing [place] to provide your care today. Today we discussed your low potassium, heart failure, and lung disease.   As we discussed, your potassium has been low recently.  Your cardiology team has started you on 60 mEq potassium and spironolactone 12.5 mg daily.  Please continue to take these medications as prescribed.  Since you have an appointment at the heart center today to collect the labs, we will not be taking any at this time.  We spoke about your heart failure.  You recently saw your cardiologist for this and they started you on spironolactone.  Please continue taking your other medications as prescribed.  Your lungs sounded clear today and I did not see any evidence of fluid overload.  We also spoke about your COPD.  Please continue using your albuterol and Trelegy inhalers as prescribed.  Your lungs sounded clear and I did not hear any wheezing or crackles on my exam today.  Please continue to take your [facility] weekly.  I am hopeful that this will help you with your weight loss and improve some of your joint pain symptoms.  As we discussed, it is important for you to continue continue to work with therapy to increase your mobility.   You are currently taking many medications and are doing a great job taking them regularly.  Keep up the good work.   Follow up: 2 months    We look forward to seeing you next time. Please call our clinic at [phone] if you have any questions or concerns. The best time to call is Monday-Friday from 9am-4pm, but there is someone available 24/7. If after hours or the weekend, call the main hospital number and ask for the Internal Medicine Resident On-Call. If you need medication refills, please notify your pharmacy one week in advance and they will send [place] a request.   Thank you for trusting me with your care. Wishing you the best!   Annett Fabian, MD [facility]

## 2022-12-13 NOTE — Assessment & Plan Note (Addendum)
Patient has a history of HFrEF with an ejection fraction of less than 20% on an echo in July of this year. She is following with cardiology and had a recent visit on 9/5.  At that visit, they continued medications including torsemide 60 mg daily, metoprolol 25 mg daily, losartan 25 mg daily, Jardiance 10 mg daily.  She was also started on spironolactone 12.5 mg daily, which may also help with her potassium.  Plan is to repeat an echo again October. We will help co-manage this with cardiology.   Today on my exam, she is euvolemic. No signs of active heart failure. Denies SOB, increased swelling. Does have some orthopnea but says she is at her baseline, which is sleeping in an elevated hospital bed with 2 pillows.

## 2022-12-13 NOTE — Assessment & Plan Note (Addendum)
Patient reports that she started taking her Wegovy on Monday.  Denies any current side effects or issues.  I am hopeful that this will help assist her in weight loss, which will improve her chronic disease management and joint pains. She is continuing to work with a physical therapist.

## 2022-12-13 NOTE — Assessment & Plan Note (Addendum)
Patient's potassium has been low recently, as low as 2.4 on 8/30, for which she was hospitalized for potassium repletion. Her most recent potassium was 3.0 on 9/5.  She was previously taking potassium chloride 40 mEq equivalents daily, which was just increased to 60 mEq on 9/6 by cardiology.  She was also recently started on spironolactone 12.5 mg daily.  She denies any new weakness, cramping, or fatigue.  She has an appointment today at the heart center to draw labs, so we will hold off on getting labs at this visit and follow-up on the labs that she is getting there.

## 2022-12-13 NOTE — Assessment & Plan Note (Signed)
Patient has a history of atrial fibrillation dating back to 2022.  She is currently rate controlled on metoprolol XL 25 mg daily.  She is taking Xarelto for anticoagulation.  She denies any recent palpitations, bleeding, or falls.

## 2022-12-13 NOTE — Assessment & Plan Note (Addendum)
Patient blood pressure is well-controlled today at 119/73.  She is currently taking losartan 25 mg daily, torsemide 60 mg daily, spironolactone 25 mg daily, metoprolol 25 mg daily, Jardiance 10 mg daily.  She is currently asymptomatic.  We will recheck this again at her next visit. No medication changes at this time.

## 2022-12-13 NOTE — Progress Notes (Signed)
CC: Hospital follow up  HPI:  [name] is a 72 y.o. female living with a history stated below and presents today for hospital follow up. Please see problem based assessment and plan for additional details.  Past Medical History:  Diagnosis Date   A-fib (HCC)    Acute hypoxemic respiratory failure (HCC) 02/20/2022   Acute on chronic hypoxic respiratory failure (HCC) 01/04/2022   Acute upper respiratory infection 01/08/2022   Anxiety    Arthritis    "qwhre; joints, back" (04/17/2017)   Atrial fibrillation (HCC)    Benign breast cyst in female, left 01/08/2017   Found by Screening mammogram, evaluated by [facility] on 01/08/17 and determined to be a benign simple breast cyst.   Breast cancer (HCC)    Cellulitis of left lower leg 05/30/2017   CHF (congestive heart failure) (HCC)    Chronic low back pain 08/21/2016   Chronic lower back pain    Chronic venous insufficiency    /notes 05/30/2017   COPD (chronic obstructive pulmonary disease) (HCC)    Depression    Diabetes (HCC) 01/08/2022   Diabetes mellitus without complication (HCC)    DVT (deep venous thrombosis) (HCC) 11/16/2016   Dysrhythmia    Esophageal dysmotility 11/10/2019   Previous workup by ENT with fiberoptic leryngoscopy which was normal. Dx with laryngeal pharyngeal reflux.   GERD (gastroesophageal reflux disease)    Headache    "weekly for the last 3 months" (04/17/2017)   History of pulmonary embolism    Pulmonary embolism   Hyperlipidemia    Hypertension    Laryngopharyngeal reflux 12/18/2015   Morbid obesity (HCC)    PE (pulmonary embolism)    Pulmonary embolism (HCC) 09/21/2014   Sleep apnea    Stroke (HCC) 12/02/2021    Current Outpatient Medications on File Prior to Visit  Medication Sig Dispense Refill   albuterol (VENTOLIN HFA) 108 (90 Base) MCG/ACT inhaler Inhale 2 puffs into the lungs every 4 (four) hours as needed for shortness of breath. (Patient not taking: Reported on 12/07/2022) 18 g 1    alendronate (FOSAMAX) 70 MG tablet Take 70 mg by mouth once a week. Take with a full glass of water on an empty stomach, Patient hasn't started medication.     aspirin EC 81 MG tablet Take 81 mg by mouth daily.     atorvastatin (LIPITOR) 80 MG tablet Take 1 tablet (80 mg total) by mouth daily. 100 tablet 2   empagliflozin (JARDIANCE) 10 MG TABS tablet Take 1 tablet (10 mg total) by mouth daily before breakfast. 30 tablet 2   esomeprazole (NEXIUM) 40 MG capsule Take 1 capsule (40 mg total) by mouth daily as needed (for heartburn or indigestion). (Patient taking differently: Take 40 mg by mouth daily.) 90 capsule 1   Fluticasone-Umeclidin-Vilant (TRELEGY ELLIPTA) 100-62.5-25 MCG/ACT AEPB Inhale 1 puff into the lungs daily. 60 each 5   gabapentin (NEURONTIN) 400 MG capsule Take 1 capsule (400 mg total) by mouth 2 (two) times daily. 60 capsule 2   letrozole (FEMARA) 2.5 MG tablet TAKE 1 TABLET(2.5 MG) BY MOUTH DAILY (Patient taking differently: Take 2.5 mg by mouth daily. TAKE 1 TABLET(2.5 MG) BY MOUTH DAILY) 90 tablet 3   LORazepam (ATIVAN) 0.5 MG tablet Take 0.5 mg by mouth daily as needed for anxiety. (Patient not taking: Reported on 12/07/2022)     losartan (COZAAR) 25 MG tablet Take 1 tablet (25 mg total) by mouth daily. (Patient taking differently: Take 25 mg by mouth daily.  Taking 12.5 mg if BP low) 30 tablet 11   metoprolol succinate (TOPROL-XL) 25 MG 24 hr tablet TAKE 1 TABLET BY MOUTH DAILY 100 tablet 2   naloxone (NARCAN) nasal spray 4 mg/0.1 mL Place 1 spray into the nose once as needed (for overdose suspected). for opioid overdose (Patient not taking: Reported on 12/07/2022)     oxybutynin (DITROPAN) 5 MG tablet Take 1 tablet (5 mg total) by mouth 3 (three) times daily.     oxyCODONE (ROXICODONE) 15 MG immediate release tablet Take 15 mg by mouth See admin instructions. Take 15mg  (1 tablet) by mouth every morning and 15mg  (1 tablet) at bedtime in addition to twice during the day as needed.      OXYGEN Inhale 2-3 L into the lungs daily.     potassium chloride SA (KLOR-CON M) 20 MEQ tablet Take 3 tablets (60 mEq total) by mouth daily. 90 tablet 2   PRESCRIPTION MEDICATION Inhale into the lungs at bedtime. cpap     rivaroxaban (XARELTO) 20 MG TABS tablet Take 1 tablet (20 mg total) by mouth every morning. 90 tablet 3   Semaglutide-Weight Management (WEGOVY) 1 MG/0.5ML SOAJ Inject 1 mg into the skin once a week. 2 mL 3   spironolactone (ALDACTONE) 25 MG tablet Take 0.5 tablets (12.5 mg total) by mouth daily. 45 tablet 3   torsemide (DEMADEX) 20 MG tablet Take 3 tablets (60 mg total) by mouth daily. 90 tablet 2   No current facility-administered medications on file prior to visit.    Family History  Problem Relation Age of Onset   Breast cancer Mother        before age 64   Hypertension Mother    Hyperlipidemia Mother    Hypertension Maternal Grandfather    Hyperlipidemia Maternal Grandfather     Social History   Socioeconomic History   Marital status: Divorced    Spouse name: Not on file   Number of children: Not on file   Years of education: Not on file   Highest education level: Not on file  Occupational History   Not on file  Tobacco Use   Smoking status: Former    Current packs/day: 0.00    Average packs/day: 2.0 packs/day for 60.4 years (120.8 ttl pk-yrs)    Types: Cigarettes    Start date: 08/16/1961    Quit date: 01/02/2022    Years since quitting: 0.9   Smokeless tobacco: Never   Tobacco comments:    Pt smokes about 4 ciggs daily. Stopped 01/02/2022   Vaping Use   Vaping status: Never Used  Substance and Sexual Activity   Alcohol use: No   Drug use: No   Sexual activity: Not Currently    Partners: Male  Other Topics Concern   Not on file  Social History Narrative   Lives in [facility] in [place]. Lives alone, but is dependent in ADLs/IADLs. Previously had HH PT and RN but dismissed them with plans to use the [employer]. Two daughters live  nearby. Previously resided in [place].   Social Determinants of Health   Financial Resource Strain: Medium Risk (11/16/2022)   Overall Financial Resource Strain (CARDIA)    Difficulty of Paying Living Expenses: Somewhat hard  Food Insecurity: No Food Insecurity (12/01/2022)   Hunger Vital Sign    Worried About Running Out of Food in the Last Year: Never true    Ran Out of Food in the Last Year: Never true  Transportation Needs: No Transportation  Needs (12/01/2022)   PRAPARE - Administrator, Civil Service (Medical): No    Lack of Transportation (Non-Medical): No  Physical Activity: Inactive (11/16/2022)   Exercise Vital Sign    Days of Exercise per Week: 0 days    Minutes of Exercise per Session: 0 min  Stress: No Stress Concern Present (12/02/2021)   [employer] of Occupational Health - Occupational Stress Questionnaire    Feeling of Stress : Only a little  Social Connections: Moderately Integrated (03/15/2022)   Social Connection and Isolation Panel [NHANES]    Frequency of Communication with Friends and Family: More than three times a week    Frequency of Social Gatherings with Friends and Family: More than three times a week    Attends Religious Services: More than 4 times per year    Active Member of [employer] or Organizations: Yes    Attends Engineer, structural: More than 4 times per year    Marital Status: Divorced  Intimate Partner Violence: Not At Risk (12/01/2022)   Humiliation, Afraid, Rape, and Kick questionnaire    Fear of Current or Ex-Partner: No    Emotionally Abused: No    Physically Abused: No    Sexually Abused: No    Review of Systems: ROS negative except for what is noted on the assessment and plan.  Vitals:   12/13/22 1106 12/13/22 1116  BP: 119/73   Pulse: (!) 103 96  Resp: 20   Temp: 98 F (36.7 C)   TempSrc: Oral   SpO2: 99%   Height: 6\' 2"  (1.88 m)    Physical Exam: Constitutional: obese, on 2L [place], in wheel  chair Cardiovascular: rate controlled, no m/r/g; 1+ LE edema, no JVD Pulmonary/Chest: CTA bilaterally, no wheezes or crackles, normal effort Neurological: alert & oriented x 3, no focal deficits Skin: warm and dry Psych: normal mood and behavior  Assessment & Plan:   Patient seen with Dr. Oswaldo Done  History of hypokalemia Patient's potassium has been low recently, as low as 2.4 on 8/30, for which she was hospitalized for potassium repletion. Her most recent potassium was 3.0 on 9/5.  She was previously taking potassium chloride 40 mEq equivalents daily, which was just increased to 60 mEq on 9/6 by cardiology.  She was also recently started on spironolactone 12.5 mg daily.  She denies any new weakness, cramping, or fatigue.  She has an appointment today at the heart center to draw labs, so we will hold off on getting labs at this visit and follow-up on the labs that she is getting there.  Heart failure with reduced ejection fraction [facility]) Patient has a history of HFrEF with an ejection fraction of less than 20% on an echo in July of this year. She is following with cardiology and had a recent visit on 9/5.  At that visit, they continued medications including torsemide 60 mg daily, metoprolol 25 mg daily, losartan 25 mg daily, Jardiance 10 mg daily.  She was also started on spironolactone 12.5 mg daily, which may also help with her potassium.  Plan is to repeat an echo again October. We will help co-manage this with cardiology.   Today on my exam, she is euvolemic. No signs of active heart failure. Denies SOB, increased swelling. Does have some orthopnea but says she is at her baseline, which is sleeping in an elevated hospital bed with 2 pillows.   Atrial fibrillation with controlled ventricular response (HCC) Patient has a history of atrial fibrillation dating  back to 2022.  She is currently rate controlled on metoprolol XL 25 mg daily.  She is taking Xarelto for anticoagulation.  She denies any  recent palpitations, bleeding, or falls.  Chronic respiratory failure with hypoxia [facility]) Patient has a history of COPD and requires 2L [place].  Her respiratory status is stable today.  No wheezes, crackles, respiratory effort normal. Denies any worsening shortness of breath. Will continue her albuterol and trelegy.   Benign essential HTN Patient blood pressure is well-controlled today at 119/73.  She is currently taking losartan 25 mg daily, torsemide 60 mg daily, spironolactone 25 mg daily, metoprolol 25 mg daily, Jardiance 10 mg daily.  She is currently asymptomatic.  We will recheck this again at her next visit. No medication changes at this time.   Morbid obesity (HCC) Patient reports that she started taking her Wegovy on Monday.  Denies any current side effects or issues.  I am hopeful that this will help assist her in weight loss, which will improve her chronic disease management and joint pains. She is continuing to work with a physical therapist.   Annett Fabian, MD  [facility] Internal Medicine, PGY-1 Phone: [phone] Date 12/13/2022 Time 1:09 PM

## 2022-12-13 NOTE — Assessment & Plan Note (Addendum)
Patient has a history of COPD and requires 2L [place].  Her respiratory status is stable today.  No wheezes, crackles, respiratory effort normal. Denies any worsening shortness of breath. Will continue her albuterol and trelegy.

## 2022-12-14 ENCOUNTER — Telehealth (HOSPITAL_COMMUNITY): Payer: Self-pay

## 2022-12-14 ENCOUNTER — Ambulatory Visit: Payer: Self-pay

## 2022-12-14 DIAGNOSIS — I959 Hypotension, unspecified: Secondary | ICD-10-CM | POA: Diagnosis not present

## 2022-12-14 DIAGNOSIS — Z7901 Long term (current) use of anticoagulants: Secondary | ICD-10-CM | POA: Diagnosis not present

## 2022-12-14 DIAGNOSIS — J9622 Acute and chronic respiratory failure with hypercapnia: Secondary | ICD-10-CM | POA: Diagnosis not present

## 2022-12-14 DIAGNOSIS — J449 Chronic obstructive pulmonary disease, unspecified: Secondary | ICD-10-CM | POA: Diagnosis not present

## 2022-12-14 DIAGNOSIS — I4891 Unspecified atrial fibrillation: Secondary | ICD-10-CM | POA: Diagnosis not present

## 2022-12-14 DIAGNOSIS — M810 Age-related osteoporosis without current pathological fracture: Secondary | ICD-10-CM | POA: Diagnosis not present

## 2022-12-14 DIAGNOSIS — Z79891 Long term (current) use of opiate analgesic: Secondary | ICD-10-CM | POA: Diagnosis not present

## 2022-12-14 DIAGNOSIS — I5020 Unspecified systolic (congestive) heart failure: Secondary | ICD-10-CM

## 2022-12-14 DIAGNOSIS — I700 Atherosclerosis of aorta: Secondary | ICD-10-CM | POA: Diagnosis not present

## 2022-12-14 DIAGNOSIS — E785 Hyperlipidemia, unspecified: Secondary | ICD-10-CM | POA: Diagnosis not present

## 2022-12-14 DIAGNOSIS — J9621 Acute and chronic respiratory failure with hypoxia: Secondary | ICD-10-CM | POA: Diagnosis not present

## 2022-12-14 DIAGNOSIS — I2510 Atherosclerotic heart disease of native coronary artery without angina pectoris: Secondary | ICD-10-CM | POA: Diagnosis not present

## 2022-12-14 DIAGNOSIS — G4733 Obstructive sleep apnea (adult) (pediatric): Secondary | ICD-10-CM | POA: Diagnosis not present

## 2022-12-14 DIAGNOSIS — K5900 Constipation, unspecified: Secondary | ICD-10-CM | POA: Diagnosis not present

## 2022-12-14 DIAGNOSIS — G890 Central pain syndrome: Secondary | ICD-10-CM | POA: Diagnosis not present

## 2022-12-14 DIAGNOSIS — I5023 Acute on chronic systolic (congestive) heart failure: Secondary | ICD-10-CM | POA: Diagnosis not present

## 2022-12-14 DIAGNOSIS — Z7985 Long-term (current) use of injectable non-insulin antidiabetic drugs: Secondary | ICD-10-CM | POA: Diagnosis not present

## 2022-12-14 DIAGNOSIS — I272 Pulmonary hypertension, unspecified: Secondary | ICD-10-CM | POA: Diagnosis not present

## 2022-12-14 DIAGNOSIS — K219 Gastro-esophageal reflux disease without esophagitis: Secondary | ICD-10-CM | POA: Diagnosis not present

## 2022-12-14 DIAGNOSIS — I5032 Chronic diastolic (congestive) heart failure: Secondary | ICD-10-CM

## 2022-12-14 DIAGNOSIS — E119 Type 2 diabetes mellitus without complications: Secondary | ICD-10-CM | POA: Diagnosis not present

## 2022-12-14 MED ORDER — POTASSIUM CHLORIDE CRYS ER 20 MEQ PO TBCR: 40.0000 meq | EXTENDED_RELEASE_TABLET | Freq: Two times a day (BID) | ORAL | 3 refills | Status: DC

## 2022-12-14 NOTE — Patient Outreach (Signed)
  Care Coordination   Follow Up Visit Note   12/14/2022 Name: Sharon Vasquez [name]: [id number] DOB: Sep 24, 1950  Sharon Vasquez is a 72 y.o. year old female who sees Atway, Rayann N, DO for primary care. I spoke with  Sharon Vasquez by phone today.  What matters to the patients health and wellness today?  I had the opportunity to meet with Sharon Vasquez to introduce myself and explain my role within the office. During our conversation, she mentioned feeling well and expressed satisfaction with her medications. Sharon Vasquez also informed me that she had seen with Dr. Versie Starks the previous day. Due to the occupational therapist, our discussion was brief; however, she expressed her interest in continuing our conversation at a later time. While she initially suggested scheduling our next meeting for the following month, Sharon Vasquez assured me that she has my contact information and will reach out should the need arise prior to that.       Goals Addressed             This Visit's Progress    I want to monitor aand control my conditions       Patient Goals/Self Care Activities: -Patient/Caregiver will take medications as prescribed   -Patient/Caregiver will attend all scheduled provider appointments -Patient/Caregiver will call pharmacy for medication refills 3-7 days in advance of running out of medications -Patient/Caregiver will call provider office for new concerns or questions  -Patient/Caregiver will focus on medication adherence by taking medications as prescribed  -Weigh daily and record (notify MD with 3 lb weight gain over night or 5 lb in a week) -Follow CHF Action Plan -Adhere to low sodium diet  -Keep your airway clear from mucus build up  -Use a humidifier, if needed -Self assess COPD action plan zone and make appointment with provider if you have been in the yellow zone for 48 hours without improvement.  -Take the medications prescribed to control your heart rate and rhythm and reduce  your risk of blood clot formation (anticoagulants or antiplatelet medications/"blood thinners"). -Learn to check your pulse and write down the number every day.           SDOH assessments and interventions completed:  No     Care Coordination Interventions:  Yes, provided   Interventions Today    Flowsheet Row Most Recent Value  Chronic Disease   Chronic disease during today's visit Chronic Obstructive Pulmonary Disease (COPD), Congestive Heart Failure (CHF), Atrial Fibrillation (AFib)  Safety Interventions   Safety Discussed/Reviewed Safety Discussed        Follow up plan: Follow up call scheduled for 01/18/23  1130 am    Encounter Outcome:  Patient Visit Completed   Juanell Fairly RN, BSN, [facility]   Care Coordinator Phone: [phone]

## 2022-12-14 NOTE — Telephone Encounter (Signed)
Patient's potassium  has been increased to 40 meq BID and her med list has been updated to reflect this change. In addition, labs has also been placed and appointment scheduled. Pt aware, agreeable, and verbalized understanding.

## 2022-12-14 NOTE — Progress Notes (Signed)
Internal Medicine Clinic Attending  I was physically present during the key portions of the resident provided service and participated in the medical decision making of patient's management care. I reviewed pertinent patient test results.  The assessment, diagnosis, and plan were formulated together and I agree with the documentation in the resident's note.  Erlinda Hong, MD FACP

## 2022-12-14 NOTE — Telephone Encounter (Signed)
-----   Message from [facility], [place] N sent at 12/13/2022  5:46 PM EDT ----- Her potassium remains low. Please confirm she is taking potassium correctly. If taking 60 mEQ daily needs to increase to 40 BID and recheck BMET early next week

## 2022-12-14 NOTE — Addendum Note (Signed)
Addended by: Erlinda Hong T on: 12/14/2022 10:05 AM   Modules accepted: Level of Service

## 2022-12-14 NOTE — Patient Instructions (Signed)
Visit Information  Thank you for taking time to visit with me today. Please don't hesitate to contact me if I can be of assistance to you.   Following are the goals we discussed today:   Goals Addressed             This Visit's Progress    I want to monitor aand control my conditions       Patient Goals/Self Care Activities: -Patient/Caregiver will take medications as prescribed   -Patient/Caregiver will attend all scheduled provider appointments -Patient/Caregiver will call pharmacy for medication refills 3-7 days in advance of running out of medications -Patient/Caregiver will call provider office for new concerns or questions  -Patient/Caregiver will focus on medication adherence by taking medications as prescribed  -Weigh daily and record (notify MD with 3 lb weight gain over night or 5 lb in a week) -Follow CHF Action Plan -Adhere to low sodium diet  -Keep your airway clear from mucus build up  -Use a humidifier, if needed -Self assess COPD action plan zone and make appointment with provider if you have been in the yellow zone for 48 hours without improvement.  -Take the medications prescribed to control your heart rate and rhythm and reduce your risk of blood clot formation (anticoagulants or antiplatelet medications/"blood thinners"). -Learn to check your pulse and write down the number every day.           Our next appointment is by telephone on 01/18/23 at 1130 am  Please call the care guide team at [phone] if you need to cancel or reschedule your appointment.   If you are experiencing a Mental Health or Behavioral Health Crisis or need someone to talk to, please call 1-800-273-TALK (toll free, 24 hour hotline)  Patient verbalizes understanding of instructions and care plan provided today and agrees to view in MyChart. Active MyChart status and patient understanding of how to access instructions and care plan via MyChart confirmed with patient.     Juanell Fairly  RN, BSN, [facility] [employer] Phone: [phone]

## 2022-12-15 ENCOUNTER — Telehealth (HOSPITAL_COMMUNITY): Payer: Self-pay | Admitting: Licensed Clinical Social Worker

## 2022-12-15 DIAGNOSIS — G890 Central pain syndrome: Secondary | ICD-10-CM | POA: Diagnosis not present

## 2022-12-15 DIAGNOSIS — J9622 Acute and chronic respiratory failure with hypercapnia: Secondary | ICD-10-CM | POA: Diagnosis not present

## 2022-12-15 DIAGNOSIS — E119 Type 2 diabetes mellitus without complications: Secondary | ICD-10-CM | POA: Diagnosis not present

## 2022-12-15 DIAGNOSIS — K5900 Constipation, unspecified: Secondary | ICD-10-CM | POA: Diagnosis not present

## 2022-12-15 DIAGNOSIS — E785 Hyperlipidemia, unspecified: Secondary | ICD-10-CM | POA: Diagnosis not present

## 2022-12-15 DIAGNOSIS — I2510 Atherosclerotic heart disease of native coronary artery without angina pectoris: Secondary | ICD-10-CM | POA: Diagnosis not present

## 2022-12-15 DIAGNOSIS — J9621 Acute and chronic respiratory failure with hypoxia: Secondary | ICD-10-CM | POA: Diagnosis not present

## 2022-12-15 DIAGNOSIS — Z79891 Long term (current) use of opiate analgesic: Secondary | ICD-10-CM | POA: Diagnosis not present

## 2022-12-15 DIAGNOSIS — J449 Chronic obstructive pulmonary disease, unspecified: Secondary | ICD-10-CM | POA: Diagnosis not present

## 2022-12-15 DIAGNOSIS — M810 Age-related osteoporosis without current pathological fracture: Secondary | ICD-10-CM | POA: Diagnosis not present

## 2022-12-15 DIAGNOSIS — K219 Gastro-esophageal reflux disease without esophagitis: Secondary | ICD-10-CM | POA: Diagnosis not present

## 2022-12-15 DIAGNOSIS — I959 Hypotension, unspecified: Secondary | ICD-10-CM | POA: Diagnosis not present

## 2022-12-15 DIAGNOSIS — I5023 Acute on chronic systolic (congestive) heart failure: Secondary | ICD-10-CM | POA: Diagnosis not present

## 2022-12-15 DIAGNOSIS — G4733 Obstructive sleep apnea (adult) (pediatric): Secondary | ICD-10-CM | POA: Diagnosis not present

## 2022-12-15 DIAGNOSIS — Z7985 Long-term (current) use of injectable non-insulin antidiabetic drugs: Secondary | ICD-10-CM | POA: Diagnosis not present

## 2022-12-15 DIAGNOSIS — I272 Pulmonary hypertension, unspecified: Secondary | ICD-10-CM | POA: Diagnosis not present

## 2022-12-15 DIAGNOSIS — I700 Atherosclerosis of aorta: Secondary | ICD-10-CM | POA: Diagnosis not present

## 2022-12-15 DIAGNOSIS — I4891 Unspecified atrial fibrillation: Secondary | ICD-10-CM | POA: Diagnosis not present

## 2022-12-15 DIAGNOSIS — Z7901 Long term (current) use of anticoagulants: Secondary | ICD-10-CM | POA: Diagnosis not present

## 2022-12-15 NOTE — Telephone Encounter (Signed)
H&V Care Navigation CSW Progress Note  Clinical Social Worker consulted to reach out to patient and assess for further social concerns.  Pt reports she lives alone but has 3 daughters and a sister.  Reports she is financially capable of supporting herself and has no concerns with paying for medications.    States main concerns are managing things at home at this time.  Reports trouble bathing and cooking.  She has been sent moms meals and has an air ordered through [facility] but is awaiting their initial visit.  Has dual complete medicare but does not have full benefits of medicaid so not eligible for PCS services moving forward.  Pt reports no other needs at this time- would appreciate a check in- will plan to follow up with patient at later date.  SDOH Screenings   Food Insecurity: No Food Insecurity (12/01/2022)  Housing: Low Risk  (12/15/2022)  Transportation Needs: No Transportation Needs (12/01/2022)  Utilities: Not At Risk (12/15/2022)  Alcohol Screen: Low Risk  (11/16/2022)  Depression (PHQ2-9): Low Risk  (12/13/2022)  Recent Concern: Depression (PHQ2-9) - Medium Risk (11/29/2022)  Financial Resource Strain: Medium Risk (11/16/2022)  Physical Activity: Inactive (11/16/2022)  Social Connections: Moderately Integrated (03/15/2022)  Stress: No Stress Concern Present (12/02/2021)  Tobacco Use: Medium Risk (12/13/2022)   Burna Sis, LCSW Clinical Social Worker [facility] Desk#: [phone] Cell#: [phone]

## 2022-12-19 ENCOUNTER — Telehealth: Payer: Self-pay | Admitting: *Deleted

## 2022-12-19 ENCOUNTER — Ambulatory Visit (HOSPITAL_COMMUNITY)
Admission: RE | Admit: 2022-12-19 | Discharge: 2022-12-19 | Disposition: A | Discharge status: Home / Self Care | Payer: 59 | Source: Ambulatory Visit | Attending: Cardiology | Admitting: Cardiology

## 2022-12-19 DIAGNOSIS — Z7985 Long-term (current) use of injectable non-insulin antidiabetic drugs: Secondary | ICD-10-CM | POA: Diagnosis not present

## 2022-12-19 DIAGNOSIS — I272 Pulmonary hypertension, unspecified: Secondary | ICD-10-CM | POA: Diagnosis not present

## 2022-12-19 DIAGNOSIS — E119 Type 2 diabetes mellitus without complications: Secondary | ICD-10-CM | POA: Diagnosis not present

## 2022-12-19 DIAGNOSIS — J9621 Acute and chronic respiratory failure with hypoxia: Secondary | ICD-10-CM | POA: Diagnosis not present

## 2022-12-19 DIAGNOSIS — J449 Chronic obstructive pulmonary disease, unspecified: Secondary | ICD-10-CM | POA: Diagnosis not present

## 2022-12-19 DIAGNOSIS — I4891 Unspecified atrial fibrillation: Secondary | ICD-10-CM | POA: Diagnosis not present

## 2022-12-19 DIAGNOSIS — I959 Hypotension, unspecified: Secondary | ICD-10-CM | POA: Diagnosis not present

## 2022-12-19 DIAGNOSIS — K219 Gastro-esophageal reflux disease without esophagitis: Secondary | ICD-10-CM | POA: Diagnosis not present

## 2022-12-19 DIAGNOSIS — I5023 Acute on chronic systolic (congestive) heart failure: Secondary | ICD-10-CM | POA: Diagnosis not present

## 2022-12-19 DIAGNOSIS — Z79891 Long term (current) use of opiate analgesic: Secondary | ICD-10-CM | POA: Diagnosis not present

## 2022-12-19 DIAGNOSIS — I2510 Atherosclerotic heart disease of native coronary artery without angina pectoris: Secondary | ICD-10-CM | POA: Diagnosis not present

## 2022-12-19 DIAGNOSIS — M810 Age-related osteoporosis without current pathological fracture: Secondary | ICD-10-CM | POA: Diagnosis not present

## 2022-12-19 DIAGNOSIS — G890 Central pain syndrome: Secondary | ICD-10-CM | POA: Diagnosis not present

## 2022-12-19 DIAGNOSIS — I5020 Unspecified systolic (congestive) heart failure: Secondary | ICD-10-CM | POA: Diagnosis not present

## 2022-12-19 DIAGNOSIS — K5900 Constipation, unspecified: Secondary | ICD-10-CM | POA: Diagnosis not present

## 2022-12-19 DIAGNOSIS — G4733 Obstructive sleep apnea (adult) (pediatric): Secondary | ICD-10-CM | POA: Diagnosis not present

## 2022-12-19 DIAGNOSIS — Z7901 Long term (current) use of anticoagulants: Secondary | ICD-10-CM | POA: Diagnosis not present

## 2022-12-19 DIAGNOSIS — E785 Hyperlipidemia, unspecified: Secondary | ICD-10-CM | POA: Diagnosis not present

## 2022-12-19 DIAGNOSIS — I700 Atherosclerosis of aorta: Secondary | ICD-10-CM | POA: Diagnosis not present

## 2022-12-19 DIAGNOSIS — J9622 Acute and chronic respiratory failure with hypercapnia: Secondary | ICD-10-CM | POA: Diagnosis not present

## 2022-12-19 LAB — BASIC METABOLIC PANEL
Anion gap: 14 (ref 5–15)
BUN: 8 mg/dL (ref 8–23)
CO2: 23 mmol/L (ref 22–32)
Calcium: 8.9 mg/dL (ref 8.9–10.3)
Chloride: 104 mmol/L (ref 98–111)
Creatinine, Ser: 1.1 mg/dL — ABNORMAL HIGH (ref 0.44–1.00)
GFR, Estimated: 53 mL/min — ABNORMAL LOW (ref >60)
Glucose, Bld: 112 mg/dL — ABNORMAL HIGH (ref 70–99)
Potassium: 3.4 mmol/L — ABNORMAL LOW (ref 3.5–5.1)
Sodium: 141 mmol/L (ref 135–145)

## 2022-12-19 NOTE — Telephone Encounter (Signed)
Received a call from [facility] RN with [facility] who saw pt today. Last visit was 9/12 and dressings were changed with Kerlix and Co-band. Pt asked the nurse if she had received orders for unna boots - pt was told no. Pt would only let the nurse change the dressing on her left leg only. Victorino Dike wants to know if pt should have unna boots or change in dressing instructions if so, a new order needs to fax to [phone]. Otherwise, she will continue current instructions with Kerlix and co-band. Thanks

## 2022-12-20 ENCOUNTER — Encounter: Payer: 59 | Admitting: Student

## 2022-12-21 ENCOUNTER — Telehealth: Payer: Self-pay | Admitting: Internal Medicine

## 2022-12-21 DIAGNOSIS — J9601 Acute respiratory failure with hypoxia: Secondary | ICD-10-CM | POA: Diagnosis not present

## 2022-12-21 DIAGNOSIS — R2681 Unsteadiness on feet: Secondary | ICD-10-CM | POA: Diagnosis not present

## 2022-12-21 DIAGNOSIS — G4733 Obstructive sleep apnea (adult) (pediatric): Secondary | ICD-10-CM | POA: Diagnosis not present

## 2022-12-21 DIAGNOSIS — J9611 Chronic respiratory failure with hypoxia: Secondary | ICD-10-CM | POA: Diagnosis not present

## 2022-12-21 DIAGNOSIS — M170 Bilateral primary osteoarthritis of knee: Secondary | ICD-10-CM | POA: Diagnosis not present

## 2022-12-21 NOTE — Telephone Encounter (Signed)
Please call [facility] back in regards to needing new PT VO @  [facility] KATHERIN  ([phone])

## 2022-12-21 NOTE — Telephone Encounter (Signed)
RTC to [employer] [facility] for [facility] requesting orders to change PT to 2 times a week for 4 weeks. Approval was given.  Will forward to PCP /Team for approval or denial

## 2022-12-22 ENCOUNTER — Telehealth (HOSPITAL_COMMUNITY): Payer: Self-pay

## 2022-12-22 DIAGNOSIS — I5020 Unspecified systolic (congestive) heart failure: Secondary | ICD-10-CM

## 2022-12-22 DIAGNOSIS — I482 Chronic atrial fibrillation, unspecified: Secondary | ICD-10-CM

## 2022-12-22 MED ORDER — SPIRONOLACTONE 25 MG PO TABS: 25.0000 mg | ORAL_TABLET | Freq: Every day | ORAL | 3 refills | Status: DC

## 2022-12-22 NOTE — Telephone Encounter (Signed)
Patient's Sharon Vasquez Medication has been sent to her pharmacy and her med list has been updated to reflect this change.in addition, pt's labs has been ordered and her appointment scheduled. Pt aware, agreeable, and verbalized understanding.

## 2022-12-22 NOTE — Telephone Encounter (Signed)
-----   Message from [facility], [place] N sent at 12/22/2022 10:33 AM EDT ----- K is still low. Increase spiro to 25 mg daily. BMET 1 week

## 2022-12-25 DIAGNOSIS — I272 Pulmonary hypertension, unspecified: Secondary | ICD-10-CM | POA: Diagnosis not present

## 2022-12-25 DIAGNOSIS — E119 Type 2 diabetes mellitus without complications: Secondary | ICD-10-CM | POA: Diagnosis not present

## 2022-12-25 DIAGNOSIS — G890 Central pain syndrome: Secondary | ICD-10-CM | POA: Diagnosis not present

## 2022-12-25 DIAGNOSIS — I2510 Atherosclerotic heart disease of native coronary artery without angina pectoris: Secondary | ICD-10-CM | POA: Diagnosis not present

## 2022-12-25 DIAGNOSIS — I4891 Unspecified atrial fibrillation: Secondary | ICD-10-CM | POA: Diagnosis not present

## 2022-12-25 DIAGNOSIS — J9621 Acute and chronic respiratory failure with hypoxia: Secondary | ICD-10-CM | POA: Diagnosis not present

## 2022-12-25 DIAGNOSIS — Z79891 Long term (current) use of opiate analgesic: Secondary | ICD-10-CM | POA: Diagnosis not present

## 2022-12-25 DIAGNOSIS — K219 Gastro-esophageal reflux disease without esophagitis: Secondary | ICD-10-CM | POA: Diagnosis not present

## 2022-12-25 DIAGNOSIS — I700 Atherosclerosis of aorta: Secondary | ICD-10-CM | POA: Diagnosis not present

## 2022-12-25 DIAGNOSIS — I5023 Acute on chronic systolic (congestive) heart failure: Secondary | ICD-10-CM | POA: Diagnosis not present

## 2022-12-25 DIAGNOSIS — Z7901 Long term (current) use of anticoagulants: Secondary | ICD-10-CM | POA: Diagnosis not present

## 2022-12-25 DIAGNOSIS — J449 Chronic obstructive pulmonary disease, unspecified: Secondary | ICD-10-CM | POA: Diagnosis not present

## 2022-12-25 DIAGNOSIS — E785 Hyperlipidemia, unspecified: Secondary | ICD-10-CM | POA: Diagnosis not present

## 2022-12-25 DIAGNOSIS — K5900 Constipation, unspecified: Secondary | ICD-10-CM | POA: Diagnosis not present

## 2022-12-25 DIAGNOSIS — M810 Age-related osteoporosis without current pathological fracture: Secondary | ICD-10-CM | POA: Diagnosis not present

## 2022-12-25 DIAGNOSIS — I959 Hypotension, unspecified: Secondary | ICD-10-CM | POA: Diagnosis not present

## 2022-12-25 DIAGNOSIS — G4733 Obstructive sleep apnea (adult) (pediatric): Secondary | ICD-10-CM | POA: Diagnosis not present

## 2022-12-25 DIAGNOSIS — Z7985 Long-term (current) use of injectable non-insulin antidiabetic drugs: Secondary | ICD-10-CM | POA: Diagnosis not present

## 2022-12-25 DIAGNOSIS — J9622 Acute and chronic respiratory failure with hypercapnia: Secondary | ICD-10-CM | POA: Diagnosis not present

## 2022-12-26 ENCOUNTER — Emergency Department (HOSPITAL_COMMUNITY)
Admission: EM | Admit: 2022-12-26 | Discharge: 2022-12-26 | Disposition: A | Discharge status: Home / Self Care | Payer: 59 | Attending: Emergency Medicine | Admitting: Emergency Medicine

## 2022-12-26 ENCOUNTER — Encounter (HOSPITAL_COMMUNITY): Payer: Self-pay

## 2022-12-26 ENCOUNTER — Other Ambulatory Visit: Payer: Self-pay

## 2022-12-26 ENCOUNTER — Emergency Department (HOSPITAL_COMMUNITY): Payer: 59

## 2022-12-26 DIAGNOSIS — J449 Chronic obstructive pulmonary disease, unspecified: Secondary | ICD-10-CM | POA: Diagnosis not present

## 2022-12-26 DIAGNOSIS — I509 Heart failure, unspecified: Secondary | ICD-10-CM | POA: Diagnosis not present

## 2022-12-26 DIAGNOSIS — M810 Age-related osteoporosis without current pathological fracture: Secondary | ICD-10-CM | POA: Diagnosis not present

## 2022-12-26 DIAGNOSIS — J9622 Acute and chronic respiratory failure with hypercapnia: Secondary | ICD-10-CM | POA: Diagnosis not present

## 2022-12-26 DIAGNOSIS — K5900 Constipation, unspecified: Secondary | ICD-10-CM | POA: Diagnosis not present

## 2022-12-26 DIAGNOSIS — E119 Type 2 diabetes mellitus without complications: Secondary | ICD-10-CM | POA: Diagnosis not present

## 2022-12-26 DIAGNOSIS — Z7985 Long-term (current) use of injectable non-insulin antidiabetic drugs: Secondary | ICD-10-CM | POA: Diagnosis not present

## 2022-12-26 DIAGNOSIS — I4891 Unspecified atrial fibrillation: Secondary | ICD-10-CM | POA: Diagnosis not present

## 2022-12-26 DIAGNOSIS — I700 Atherosclerosis of aorta: Secondary | ICD-10-CM | POA: Diagnosis not present

## 2022-12-26 DIAGNOSIS — I5023 Acute on chronic systolic (congestive) heart failure: Secondary | ICD-10-CM | POA: Diagnosis not present

## 2022-12-26 DIAGNOSIS — I517 Cardiomegaly: Secondary | ICD-10-CM | POA: Diagnosis not present

## 2022-12-26 DIAGNOSIS — Z7901 Long term (current) use of anticoagulants: Secondary | ICD-10-CM | POA: Diagnosis not present

## 2022-12-26 DIAGNOSIS — I2510 Atherosclerotic heart disease of native coronary artery without angina pectoris: Secondary | ICD-10-CM | POA: Diagnosis not present

## 2022-12-26 DIAGNOSIS — I959 Hypotension, unspecified: Secondary | ICD-10-CM | POA: Diagnosis not present

## 2022-12-26 DIAGNOSIS — E785 Hyperlipidemia, unspecified: Secondary | ICD-10-CM | POA: Diagnosis not present

## 2022-12-26 DIAGNOSIS — Z7982 Long term (current) use of aspirin: Secondary | ICD-10-CM | POA: Insufficient documentation

## 2022-12-26 DIAGNOSIS — G890 Central pain syndrome: Secondary | ICD-10-CM | POA: Diagnosis not present

## 2022-12-26 DIAGNOSIS — R042 Hemoptysis: Secondary | ICD-10-CM | POA: Insufficient documentation

## 2022-12-26 DIAGNOSIS — I272 Pulmonary hypertension, unspecified: Secondary | ICD-10-CM | POA: Diagnosis not present

## 2022-12-26 DIAGNOSIS — J9621 Acute and chronic respiratory failure with hypoxia: Secondary | ICD-10-CM | POA: Diagnosis not present

## 2022-12-26 DIAGNOSIS — G4733 Obstructive sleep apnea (adult) (pediatric): Secondary | ICD-10-CM | POA: Diagnosis not present

## 2022-12-26 DIAGNOSIS — Z79891 Long term (current) use of opiate analgesic: Secondary | ICD-10-CM | POA: Diagnosis not present

## 2022-12-26 DIAGNOSIS — K219 Gastro-esophageal reflux disease without esophagitis: Secondary | ICD-10-CM | POA: Diagnosis not present

## 2022-12-26 LAB — COMPREHENSIVE METABOLIC PANEL
ALT: 15 U/L (ref 0–44)
AST: 19 U/L (ref 15–41)
Albumin: 3.4 g/dL — ABNORMAL LOW (ref 3.5–5.0)
Alkaline Phosphatase: 94 U/L (ref 38–126)
Anion gap: 11 (ref 5–15)
BUN: 15 mg/dL (ref 8–23)
CO2: 24 mmol/L (ref 22–32)
Calcium: 9 mg/dL (ref 8.9–10.3)
Chloride: 102 mmol/L (ref 98–111)
Creatinine, Ser: 1.01 mg/dL — ABNORMAL HIGH (ref 0.44–1.00)
GFR, Estimated: 59 mL/min — ABNORMAL LOW (ref >60)
Glucose, Bld: 95 mg/dL (ref 70–99)
Potassium: 3.5 mmol/L (ref 3.5–5.1)
Sodium: 137 mmol/L (ref 135–145)
Total Bilirubin: 0.9 mg/dL (ref 0.3–1.2)
Total Protein: 7.1 g/dL (ref 6.5–8.1)

## 2022-12-26 LAB — CBC WITH DIFFERENTIAL/PLATELET
Abs Immature Granulocytes: 0.01 10*3/uL (ref 0.00–0.07)
Basophils Absolute: 0 10*3/uL (ref 0.0–0.1)
Basophils Relative: 0 %
Eosinophils Absolute: 0.1 10*3/uL (ref 0.0–0.5)
Eosinophils Relative: 2 %
HCT: 39.1 % (ref 36.0–46.0)
Hemoglobin: 12.9 g/dL (ref 12.0–15.0)
Immature Granulocytes: 0 %
Lymphocytes Relative: 40 %
Lymphs Abs: 2.6 10*3/uL (ref 0.7–4.0)
MCH: 28 pg (ref 26.0–34.0)
MCHC: 33 g/dL (ref 30.0–36.0)
MCV: 85 fL (ref 80.0–100.0)
Monocytes Absolute: 0.6 10*3/uL (ref 0.1–1.0)
Monocytes Relative: 8 %
Neutro Abs: 3.3 10*3/uL (ref 1.7–7.7)
Neutrophils Relative %: 50 %
Platelets: 477 10*3/uL — ABNORMAL HIGH (ref 150–400)
RBC: 4.6 MIL/uL (ref 3.87–5.11)
RDW: 13.7 % (ref 11.5–15.5)
WBC: 6.6 10*3/uL (ref 4.0–10.5)
nRBC: 0 % (ref 0.0–0.2)

## 2022-12-26 MED ORDER — METOPROLOL SUCCINATE ER 25 MG PO TB24: 25.0000 mg | ORAL_TABLET | Freq: Every day | ORAL | 2 refills | Status: DC

## 2022-12-26 NOTE — Discharge Instructions (Addendum)
The cause of your blood-tinged sputum sputum is unknown.  You may have possibly bit your mouth, or had a slight nosebleed.  I do not see any blood in your mouth, or your nose.  Please follow-up with your primary care doctor.  Your chest x-ray showed some slight inflammation of your lungs possibly, however you are not having any symptoms.  If you have worsening shortness of breath, chest pain, or coughing up large amounts of blood please return to the ER immediately

## 2022-12-26 NOTE — ED Provider Notes (Signed)
[place] EMERGENCY DEPARTMENT AT [facility] Provider Note   CSN: [id number] Arrival date & time: 12/26/22  1622     History  Chief Complaint  Patient presents with   Hemoptysis    Sharon Vasquez is a 72 y.o. female, history of A-fib, PE, CHF, COPD, who presents to the ED secondary to spitting up blood for an hour.  States that she was eating a [employer] sandwich, when she felt something funny in her mouth.  She states then she started to proceed to spit up blood.  She denies any kind of sore throat, cough, shortness of breath, chest pain, abdominal pain.  Notes that now it is cleared up, but that it was like this for an hour.  Denies any choking on the sandwich.  Home Medications Prior to Admission medications   Medication Sig Start Date End Date Taking? Authorizing Provider  albuterol (VENTOLIN HFA) 108 (90 Base) MCG/ACT inhaler Inhale 2 puffs into the lungs every 4 (four) hours as needed for shortness of breath. Patient not taking: Reported on 12/07/2022 07/10/22   Atway, Rayann N, DO  alendronate (FOSAMAX) 70 MG tablet Take 70 mg by mouth once a week. Take with a full glass of water on an empty stomach, Patient hasn't started medication.    [provider]  aspirin EC 81 MG tablet Take 81 mg by mouth daily. 12/15/20   [provider]  atorvastatin (LIPITOR) 80 MG tablet Take 1 tablet (80 mg total) by mouth daily. 10/09/22   Atway, Rayann N, DO  empagliflozin (JARDIANCE) 10 MG TABS tablet Take 1 tablet (10 mg total) by mouth daily before breakfast. 11/16/22   Clegg, Amy D, NP  esomeprazole (NEXIUM) 40 MG capsule Take 1 capsule (40 mg total) by mouth daily as needed (for heartburn or indigestion). Patient taking differently: Take 40 mg by mouth daily. 01/20/19   Synetta Fail, MD  Fluticasone-Umeclidin-Vilant (TRELEGY ELLIPTA) 100-62.5-25 MCG/ACT AEPB Inhale 1 puff into the lungs daily. 11/06/22   Nooruddin, Jason Fila, MD  gabapentin (NEURONTIN) 400 MG capsule  Take 1 capsule (400 mg total) by mouth 2 (two) times daily. 11/16/22 11/16/23  Alexander-Savino, [place], MD  letrozole (FEMARA) 2.5 MG tablet TAKE 1 TABLET(2.5 MG) BY MOUTH DAILY Patient taking differently: Take 2.5 mg by mouth daily. TAKE 1 TABLET(2.5 MG) BY MOUTH DAILY 06/14/22   Serena Croissant, MD  LORazepam (ATIVAN) 0.5 MG tablet Take 0.5 mg by mouth daily as needed for anxiety. Patient not taking: Reported on 12/07/2022    [provider]  losartan (COZAAR) 25 MG tablet Take 1 tablet (25 mg total) by mouth daily. Patient taking differently: Take 25 mg by mouth daily. Taking 12.5 mg if BP low 02/22/22 02/22/23  Modena Slater, DO  metoprolol succinate (TOPROL-XL) 25 MG 24 hr tablet Take 1 tablet (25 mg total) by mouth daily. 12/26/22   Atway, Derwood Kaplan, DO  naloxone (NARCAN) nasal spray 4 mg/0.1 mL Place 1 spray into the nose once as needed (for overdose suspected). for opioid overdose Patient not taking: Reported on 12/07/2022 11/02/21   [provider]  oxybutynin (DITROPAN) 5 MG tablet Take 1 tablet (5 mg total) by mouth 3 (three) times daily. 10/17/22   Marrianne Mood, MD  oxyCODONE (ROXICODONE) 15 MG immediate release tablet Take 15 mg by mouth See admin instructions. Take 15mg  (1 tablet) by mouth every morning and 15mg  (1 tablet) at bedtime in addition to twice during the day as needed. 08/05/20   [provider]  OXYGEN Inhale 2-3 L into the lungs daily.    [provider]  potassium chloride SA (KLOR-CON M) 20 MEQ tablet Take 2 tablets (40 mEq total) by mouth 2 (two) times daily. 12/14/22   Andrey Farmer, PA-C  PRESCRIPTION MEDICATION Inhale into the lungs at bedtime. cpap    [provider]  rivaroxaban (XARELTO) 20 MG TABS tablet Take 1 tablet (20 mg total) by mouth every morning. 02/13/22   Merrilyn Puma, MD  Semaglutide-Weight Management [facility]) 1 MG/0.5ML SOAJ Inject 1 mg into the skin once a week. 11/30/22   Atway, Rayann N, DO   spironolactone (ALDACTONE) 25 MG tablet Take 1 tablet (25 mg total) by mouth daily. 12/22/22 03/22/23  Andrey Farmer, PA-C  torsemide (DEMADEX) 20 MG tablet Take 3 tablets (60 mg total) by mouth daily. 11/16/22   Tonye Becket D, NP      Allergies    Patient has no known allergies.    Review of Systems   Review of Systems  Respiratory:  Negative for shortness of breath.   Cardiovascular:  Negative for chest pain.  Gastrointestinal:  Negative for nausea and vomiting.    Physical Exam Updated Vital Signs BP 125/87   Pulse 91   Temp 98.6 F (37 C) (Oral)   Resp 18   SpO2 95%  Physical Exam Vitals and nursing note reviewed.  Constitutional:      General: She is not in acute distress.    Appearance: She is well-developed. She is obese.  HENT:     Head: Normocephalic and atraumatic.     Nose: Nose normal.     Mouth/Throat:     Comments: No blood in oropharynx.  No wounds.  Multiple decaying teeth. Eyes:     Conjunctiva/sclera: Conjunctivae normal.  Cardiovascular:     Rate and Rhythm: Normal rate and regular rhythm.     Heart sounds: No murmur heard. Pulmonary:     Effort: Pulmonary effort is normal. No respiratory distress.     Breath sounds: Normal breath sounds.  Abdominal:     Palpations: Abdomen is soft.     Tenderness: There is no abdominal tenderness.  Musculoskeletal:        General: No swelling.     Cervical back: Neck supple.  Skin:    General: Skin is warm and dry.     Capillary Refill: Capillary refill takes less than 2 seconds.  Neurological:     Mental Status: She is alert.  Psychiatric:        Mood and Affect: Mood normal.     ED Results / Procedures / Treatments   Labs (all labs ordered are listed, but only abnormal results are displayed) Labs Reviewed  CBC WITH DIFFERENTIAL/PLATELET - Abnormal; Notable for the following components:      Result Value   Platelets 477 (*)    All other components within normal limits  COMPREHENSIVE METABOLIC  PANEL - Abnormal; Notable for the following components:   Creatinine, Ser 1.01 (*)    Albumin 3.4 (*)    GFR, Estimated 59 (*)    All other components within normal limits    EKG None  Radiology DG Chest Portable 1 View  Result Date: 12/26/2022 CLINICAL DATA:  Hemoptysis. EXAM: PORTABLE CHEST 1 VIEW COMPARISON:  October 30, 2022. FINDINGS: Mild cardiomegaly is noted. Mild diffuse reticular densities are noted which may represent chronic scarring, but acute superimposed atypical inflammation cannot be excluded. Bony thorax is unremarkable. IMPRESSION:  Mild diffuse reticular densities are noted as described above. Electronically Signed   By: Lupita Raider M.D.   On: 12/26/2022 18:27    Procedures Procedures    Medications Ordered in ED Medications - No data to display  ED Course/ Medical Decision Making/ A&P                                 Medical Decision Making Patient is a 72 year old female, here for blood-tinged sputum, that happened for about an hour today after eating a sandwich.  She states she did not choke on the sandwich, is not having shortness of breath, chest pain, or abdominal pain.  Is overall well-appearing.  No evidence of any epistaxis, on exam, or blood in oropharynx.  She has clear lung sounds.  Chest x-ray, blood work ordered.  Has been compliant with blood thinner, she has a history of a PE.  Amount and/or Complexity of Data Reviewed Radiology:     Details: Chest x-ray shows mild diffuse reticular opacities Discussion of management or test interpretation with external provider(s): Discussed with patient, blood work is overall reassuring, stable hemoglobin, chest x-ray shows mild diffuse reticular opacities, she has no respiratory symptoms, chest pain.  This only happened for about an hour.  Is possible that patient may have an irritated esophagus, or that may be she has slight oozing from the use of the blood thinner.  No evidence of epistaxis, exam, or wounds in  the mouth.  She is not having chest pain or shortness of breath thus I think pulmonary or cardiac issue is unlikely.  I will have her follow-up with her primary care doctor, return if she feels like symptoms are worsening.    Final Clinical Impression(s) / ED Diagnoses Final diagnoses:  Blood-tinged sputum    Rx / DC Orders ED Discharge Orders     None         Dolphus Jenny, Harley [place], PA 12/26/22 1950    Benjiman Core, MD 12/27/22 1438

## 2022-12-26 NOTE — ED Triage Notes (Signed)
Pt states she has been spitting up blood the last 2 hours in a small quantity, she states it started at brighter red and it has since then transitioned to a dark red. It does not have any appearance of coffee grounds. She is not vomiting, no nausea, no chest pain, no shortness of breath. She does not know where the blood is coming from but was concerned it coming from her stomach/throat or mouth.

## 2022-12-27 DIAGNOSIS — M25561 Pain in right knee: Secondary | ICD-10-CM | POA: Diagnosis not present

## 2022-12-27 DIAGNOSIS — E119 Type 2 diabetes mellitus without complications: Secondary | ICD-10-CM | POA: Diagnosis not present

## 2022-12-27 DIAGNOSIS — J439 Emphysema, unspecified: Secondary | ICD-10-CM | POA: Diagnosis not present

## 2022-12-27 DIAGNOSIS — G8929 Other chronic pain: Secondary | ICD-10-CM | POA: Diagnosis not present

## 2022-12-27 DIAGNOSIS — Z79899 Other long term (current) drug therapy: Secondary | ICD-10-CM | POA: Diagnosis not present

## 2022-12-27 DIAGNOSIS — J449 Chronic obstructive pulmonary disease, unspecified: Secondary | ICD-10-CM | POA: Diagnosis not present

## 2022-12-27 DIAGNOSIS — I509 Heart failure, unspecified: Secondary | ICD-10-CM | POA: Diagnosis not present

## 2022-12-27 DIAGNOSIS — I4891 Unspecified atrial fibrillation: Secondary | ICD-10-CM | POA: Diagnosis not present

## 2022-12-27 DIAGNOSIS — R030 Elevated blood-pressure reading, without diagnosis of hypertension: Secondary | ICD-10-CM | POA: Diagnosis not present

## 2022-12-27 DIAGNOSIS — I10 Essential (primary) hypertension: Secondary | ICD-10-CM | POA: Diagnosis not present

## 2022-12-27 DIAGNOSIS — M25562 Pain in left knee: Secondary | ICD-10-CM | POA: Diagnosis not present

## 2022-12-28 ENCOUNTER — Ambulatory Visit (HOSPITAL_COMMUNITY)
Admission: RE | Admit: 2022-12-28 | Discharge: 2022-12-28 | Disposition: A | Discharge status: Home / Self Care | Payer: 59 | Source: Ambulatory Visit | Attending: Physician Assistant | Admitting: Physician Assistant

## 2022-12-28 DIAGNOSIS — I5020 Unspecified systolic (congestive) heart failure: Secondary | ICD-10-CM | POA: Diagnosis not present

## 2022-12-28 LAB — BASIC METABOLIC PANEL
Anion gap: 11 (ref 5–15)
BUN: 13 mg/dL (ref 8–23)
CO2: 25 mmol/L (ref 22–32)
Calcium: 9 mg/dL (ref 8.9–10.3)
Chloride: 104 mmol/L (ref 98–111)
Creatinine, Ser: 0.94 mg/dL (ref 0.44–1.00)
GFR, Estimated: >60 mL/min (ref >60)
Glucose, Bld: 87 mg/dL (ref 70–99)
Potassium: 3.5 mmol/L (ref 3.5–5.1)
Sodium: 140 mmol/L (ref 135–145)

## 2022-12-29 ENCOUNTER — Other Ambulatory Visit (HOSPITAL_COMMUNITY): Payer: 59

## 2022-12-29 DIAGNOSIS — Z79891 Long term (current) use of opiate analgesic: Secondary | ICD-10-CM | POA: Diagnosis not present

## 2022-12-29 DIAGNOSIS — I959 Hypotension, unspecified: Secondary | ICD-10-CM | POA: Diagnosis not present

## 2022-12-29 DIAGNOSIS — I2510 Atherosclerotic heart disease of native coronary artery without angina pectoris: Secondary | ICD-10-CM | POA: Diagnosis not present

## 2022-12-29 DIAGNOSIS — K5900 Constipation, unspecified: Secondary | ICD-10-CM | POA: Diagnosis not present

## 2022-12-29 DIAGNOSIS — G890 Central pain syndrome: Secondary | ICD-10-CM | POA: Diagnosis not present

## 2022-12-29 DIAGNOSIS — I4891 Unspecified atrial fibrillation: Secondary | ICD-10-CM | POA: Diagnosis not present

## 2022-12-29 DIAGNOSIS — E119 Type 2 diabetes mellitus without complications: Secondary | ICD-10-CM | POA: Diagnosis not present

## 2022-12-29 DIAGNOSIS — J9622 Acute and chronic respiratory failure with hypercapnia: Secondary | ICD-10-CM | POA: Diagnosis not present

## 2022-12-29 DIAGNOSIS — E785 Hyperlipidemia, unspecified: Secondary | ICD-10-CM | POA: Diagnosis not present

## 2022-12-29 DIAGNOSIS — I272 Pulmonary hypertension, unspecified: Secondary | ICD-10-CM | POA: Diagnosis not present

## 2022-12-29 DIAGNOSIS — Z7985 Long-term (current) use of injectable non-insulin antidiabetic drugs: Secondary | ICD-10-CM | POA: Diagnosis not present

## 2022-12-29 DIAGNOSIS — I5023 Acute on chronic systolic (congestive) heart failure: Secondary | ICD-10-CM | POA: Diagnosis not present

## 2022-12-29 DIAGNOSIS — J9621 Acute and chronic respiratory failure with hypoxia: Secondary | ICD-10-CM | POA: Diagnosis not present

## 2022-12-29 DIAGNOSIS — K219 Gastro-esophageal reflux disease without esophagitis: Secondary | ICD-10-CM | POA: Diagnosis not present

## 2022-12-29 DIAGNOSIS — I700 Atherosclerosis of aorta: Secondary | ICD-10-CM | POA: Diagnosis not present

## 2022-12-29 DIAGNOSIS — Z7901 Long term (current) use of anticoagulants: Secondary | ICD-10-CM | POA: Diagnosis not present

## 2022-12-29 DIAGNOSIS — G4733 Obstructive sleep apnea (adult) (pediatric): Secondary | ICD-10-CM | POA: Diagnosis not present

## 2022-12-29 DIAGNOSIS — J449 Chronic obstructive pulmonary disease, unspecified: Secondary | ICD-10-CM | POA: Diagnosis not present

## 2022-12-29 DIAGNOSIS — M810 Age-related osteoporosis without current pathological fracture: Secondary | ICD-10-CM | POA: Diagnosis not present

## 2022-12-31 LAB — COLOGUARD

## 2023-01-01 DIAGNOSIS — Z7985 Long-term (current) use of injectable non-insulin antidiabetic drugs: Secondary | ICD-10-CM | POA: Diagnosis not present

## 2023-01-01 DIAGNOSIS — K219 Gastro-esophageal reflux disease without esophagitis: Secondary | ICD-10-CM | POA: Diagnosis not present

## 2023-01-01 DIAGNOSIS — I2510 Atherosclerotic heart disease of native coronary artery without angina pectoris: Secondary | ICD-10-CM | POA: Diagnosis not present

## 2023-01-01 DIAGNOSIS — Z79891 Long term (current) use of opiate analgesic: Secondary | ICD-10-CM | POA: Diagnosis not present

## 2023-01-01 DIAGNOSIS — I700 Atherosclerosis of aorta: Secondary | ICD-10-CM | POA: Diagnosis not present

## 2023-01-01 DIAGNOSIS — I272 Pulmonary hypertension, unspecified: Secondary | ICD-10-CM | POA: Diagnosis not present

## 2023-01-01 DIAGNOSIS — G890 Central pain syndrome: Secondary | ICD-10-CM | POA: Diagnosis not present

## 2023-01-01 DIAGNOSIS — E119 Type 2 diabetes mellitus without complications: Secondary | ICD-10-CM | POA: Diagnosis not present

## 2023-01-01 DIAGNOSIS — I5023 Acute on chronic systolic (congestive) heart failure: Secondary | ICD-10-CM | POA: Diagnosis not present

## 2023-01-01 DIAGNOSIS — I4891 Unspecified atrial fibrillation: Secondary | ICD-10-CM | POA: Diagnosis not present

## 2023-01-01 DIAGNOSIS — E785 Hyperlipidemia, unspecified: Secondary | ICD-10-CM | POA: Diagnosis not present

## 2023-01-01 DIAGNOSIS — I959 Hypotension, unspecified: Secondary | ICD-10-CM | POA: Diagnosis not present

## 2023-01-01 DIAGNOSIS — K5900 Constipation, unspecified: Secondary | ICD-10-CM | POA: Diagnosis not present

## 2023-01-01 DIAGNOSIS — Z7901 Long term (current) use of anticoagulants: Secondary | ICD-10-CM | POA: Diagnosis not present

## 2023-01-01 DIAGNOSIS — J449 Chronic obstructive pulmonary disease, unspecified: Secondary | ICD-10-CM | POA: Diagnosis not present

## 2023-01-01 DIAGNOSIS — M810 Age-related osteoporosis without current pathological fracture: Secondary | ICD-10-CM | POA: Diagnosis not present

## 2023-01-01 DIAGNOSIS — Z79899 Other long term (current) drug therapy: Secondary | ICD-10-CM | POA: Diagnosis not present

## 2023-01-01 DIAGNOSIS — J9622 Acute and chronic respiratory failure with hypercapnia: Secondary | ICD-10-CM | POA: Diagnosis not present

## 2023-01-01 DIAGNOSIS — J9621 Acute and chronic respiratory failure with hypoxia: Secondary | ICD-10-CM | POA: Diagnosis not present

## 2023-01-01 DIAGNOSIS — G4733 Obstructive sleep apnea (adult) (pediatric): Secondary | ICD-10-CM | POA: Diagnosis not present

## 2023-01-05 DIAGNOSIS — I272 Pulmonary hypertension, unspecified: Secondary | ICD-10-CM | POA: Diagnosis not present

## 2023-01-05 DIAGNOSIS — I4891 Unspecified atrial fibrillation: Secondary | ICD-10-CM | POA: Diagnosis not present

## 2023-01-05 DIAGNOSIS — Z79891 Long term (current) use of opiate analgesic: Secondary | ICD-10-CM | POA: Diagnosis not present

## 2023-01-05 DIAGNOSIS — K219 Gastro-esophageal reflux disease without esophagitis: Secondary | ICD-10-CM | POA: Diagnosis not present

## 2023-01-05 DIAGNOSIS — J9622 Acute and chronic respiratory failure with hypercapnia: Secondary | ICD-10-CM | POA: Diagnosis not present

## 2023-01-05 DIAGNOSIS — Z7985 Long-term (current) use of injectable non-insulin antidiabetic drugs: Secondary | ICD-10-CM | POA: Diagnosis not present

## 2023-01-05 DIAGNOSIS — I700 Atherosclerosis of aorta: Secondary | ICD-10-CM | POA: Diagnosis not present

## 2023-01-05 DIAGNOSIS — G4733 Obstructive sleep apnea (adult) (pediatric): Secondary | ICD-10-CM | POA: Diagnosis not present

## 2023-01-05 DIAGNOSIS — Z7901 Long term (current) use of anticoagulants: Secondary | ICD-10-CM | POA: Diagnosis not present

## 2023-01-05 DIAGNOSIS — J9621 Acute and chronic respiratory failure with hypoxia: Secondary | ICD-10-CM | POA: Diagnosis not present

## 2023-01-05 DIAGNOSIS — I2510 Atherosclerotic heart disease of native coronary artery without angina pectoris: Secondary | ICD-10-CM | POA: Diagnosis not present

## 2023-01-05 DIAGNOSIS — G890 Central pain syndrome: Secondary | ICD-10-CM | POA: Diagnosis not present

## 2023-01-05 DIAGNOSIS — I959 Hypotension, unspecified: Secondary | ICD-10-CM | POA: Diagnosis not present

## 2023-01-05 DIAGNOSIS — E785 Hyperlipidemia, unspecified: Secondary | ICD-10-CM | POA: Diagnosis not present

## 2023-01-05 DIAGNOSIS — K5900 Constipation, unspecified: Secondary | ICD-10-CM | POA: Diagnosis not present

## 2023-01-05 DIAGNOSIS — J449 Chronic obstructive pulmonary disease, unspecified: Secondary | ICD-10-CM | POA: Diagnosis not present

## 2023-01-05 DIAGNOSIS — I5023 Acute on chronic systolic (congestive) heart failure: Secondary | ICD-10-CM | POA: Diagnosis not present

## 2023-01-05 DIAGNOSIS — M810 Age-related osteoporosis without current pathological fracture: Secondary | ICD-10-CM | POA: Diagnosis not present

## 2023-01-05 DIAGNOSIS — E119 Type 2 diabetes mellitus without complications: Secondary | ICD-10-CM | POA: Diagnosis not present

## 2023-01-09 ENCOUNTER — Telehealth (HOSPITAL_COMMUNITY): Payer: Self-pay | Admitting: Licensed Clinical Social Worker

## 2023-01-09 DIAGNOSIS — J449 Chronic obstructive pulmonary disease, unspecified: Secondary | ICD-10-CM | POA: Diagnosis not present

## 2023-01-09 DIAGNOSIS — I700 Atherosclerosis of aorta: Secondary | ICD-10-CM | POA: Diagnosis not present

## 2023-01-09 DIAGNOSIS — M810 Age-related osteoporosis without current pathological fracture: Secondary | ICD-10-CM | POA: Diagnosis not present

## 2023-01-09 DIAGNOSIS — J9621 Acute and chronic respiratory failure with hypoxia: Secondary | ICD-10-CM | POA: Diagnosis not present

## 2023-01-09 DIAGNOSIS — K5900 Constipation, unspecified: Secondary | ICD-10-CM | POA: Diagnosis not present

## 2023-01-09 DIAGNOSIS — K219 Gastro-esophageal reflux disease without esophagitis: Secondary | ICD-10-CM | POA: Diagnosis not present

## 2023-01-09 DIAGNOSIS — G4733 Obstructive sleep apnea (adult) (pediatric): Secondary | ICD-10-CM | POA: Diagnosis not present

## 2023-01-09 DIAGNOSIS — I5023 Acute on chronic systolic (congestive) heart failure: Secondary | ICD-10-CM | POA: Diagnosis not present

## 2023-01-09 DIAGNOSIS — E785 Hyperlipidemia, unspecified: Secondary | ICD-10-CM | POA: Diagnosis not present

## 2023-01-09 DIAGNOSIS — G890 Central pain syndrome: Secondary | ICD-10-CM | POA: Diagnosis not present

## 2023-01-09 DIAGNOSIS — Z79891 Long term (current) use of opiate analgesic: Secondary | ICD-10-CM | POA: Diagnosis not present

## 2023-01-09 DIAGNOSIS — J9622 Acute and chronic respiratory failure with hypercapnia: Secondary | ICD-10-CM | POA: Diagnosis not present

## 2023-01-09 DIAGNOSIS — E119 Type 2 diabetes mellitus without complications: Secondary | ICD-10-CM | POA: Diagnosis not present

## 2023-01-09 DIAGNOSIS — I2510 Atherosclerotic heart disease of native coronary artery without angina pectoris: Secondary | ICD-10-CM | POA: Diagnosis not present

## 2023-01-09 DIAGNOSIS — Z7985 Long-term (current) use of injectable non-insulin antidiabetic drugs: Secondary | ICD-10-CM | POA: Diagnosis not present

## 2023-01-09 DIAGNOSIS — I4891 Unspecified atrial fibrillation: Secondary | ICD-10-CM | POA: Diagnosis not present

## 2023-01-09 DIAGNOSIS — Z7901 Long term (current) use of anticoagulants: Secondary | ICD-10-CM | POA: Diagnosis not present

## 2023-01-09 DIAGNOSIS — I272 Pulmonary hypertension, unspecified: Secondary | ICD-10-CM | POA: Diagnosis not present

## 2023-01-09 DIAGNOSIS — I959 Hypotension, unspecified: Secondary | ICD-10-CM | POA: Diagnosis not present

## 2023-01-09 NOTE — Progress Notes (Deleted)
Cardiology Clinic Note   Patient Name: Sharon Vasquez Date of Encounter: 01/09/2023  Primary Care Provider:  [name], DO Primary Cardiologist:  Nanetta Batty, MD  Patient Profile    Sharon Vasquez is a 72 y.o. female  presents to the clinic for follow-up evaluation of her diastolic CHF and atrial fibrillation.  Past Medical History    Past Medical History:  Diagnosis Date   A-fib (HCC)    Acute hypoxemic respiratory failure (HCC) 02/20/2022   Acute on chronic hypoxic respiratory failure (HCC) 01/04/2022   Acute upper respiratory infection 01/08/2022   Anxiety    Arthritis    "qwhre; joints, back" (04/17/2017)   Atrial fibrillation (HCC)    Benign breast cyst in female, left 01/08/2017   Found by Screening mammogram, evaluated by U/S on 01/08/17 and determined to be a benign simple breast cyst.   Breast cancer (HCC)    Cellulitis of left lower leg 05/30/2017   CHF (congestive heart failure) (HCC)    Chronic low back pain 08/21/2016   Chronic lower back pain    Chronic venous insufficiency    /notes 05/30/2017   COPD (chronic obstructive pulmonary disease) (HCC)    Depression    Diabetes (HCC) 01/08/2022   Diabetes mellitus without complication (HCC)    DVT (deep venous thrombosis) (HCC) 11/16/2016   Dysrhythmia    Esophageal dysmotility 11/10/2019   Previous workup by ENT with fiberoptic leryngoscopy which was normal. Dx with laryngeal pharyngeal reflux.   GERD (gastroesophageal reflux disease)    Headache    "weekly for the last 3 months" (04/17/2017)   History of pulmonary embolism    Pulmonary embolism   Hyperlipidemia    Hypertension    Laryngopharyngeal reflux 12/18/2015   Morbid obesity (HCC)    PE (pulmonary embolism)    Pulmonary embolism (HCC) 09/21/2014   Sleep apnea    Stroke (HCC) 12/02/2021   Past Surgical History:  Procedure Laterality Date   ABDOMINAL HYSTERECTOMY     APPENDECTOMY     BALLOON DILATION N/A 03/09/2020   Procedure:  BALLOON DILATION;  Surgeon: Kathi Der, MD;  Location: [facility];  Service: Gastroenterology;  Laterality: N/A;   BIOPSY  03/09/2020   Procedure: BIOPSY;  Surgeon: Kathi Der, MD;  Location: [facility];  Service: Gastroenterology;;   BIOPSY  04/12/2021   Procedure: BIOPSY;  Surgeon: Kathi Der, MD;  Location: [facility];  Service: Gastroenterology;;   BREAST BIOPSY Right 03/11/2021   US biopsy/ coil clip/ path pending   BREAST BIOPSY Right 03/11/2021   US biopsy/ ribbone clip/ path pending   BREAST CYST EXCISION Left    "six o'clock"   BREAST LUMPECTOMY Left    BREAST LUMPECTOMY WITH RADIOACTIVE SEED LOCALIZATION Right 05/02/2021   Procedure: RIGHT BREAST LUMPECTOMY WITH RADIOACTIVE SEED LOCALIZATION;  Surgeon: Griselda Miner, MD;  Location: [facility];  Service: General;  Laterality: Right;   CHOLECYSTECTOMY     DILATION AND CURETTAGE OF UTERUS     ESOPHAGOGASTRODUODENOSCOPY (EGD) WITH PROPOFOL N/A 03/09/2020   Procedure: ESOPHAGOGASTRODUODENOSCOPY (EGD) WITH PROPOFOL;  Surgeon: Kathi Der, MD;  Location: [facility];  Service: Gastroenterology;  Laterality: N/A;   ESOPHAGOGASTRODUODENOSCOPY (EGD) WITH PROPOFOL N/A 04/12/2021   Procedure: ESOPHAGOGASTRODUODENOSCOPY (EGD) WITH PROPOFOL;  Surgeon: Kathi Der, MD;  Location: [facility];  Service: Gastroenterology;  Laterality: N/A;   IR ABLATE LIVER CRYOABLATION  07/23/2019   IR RADIOLOGIST EVAL & MGMT  07/18/2019   RIGHT/LEFT HEART CATH AND CORONARY ANGIOGRAPHY N/A 11/01/2022  Procedure: RIGHT/LEFT HEART CATH AND CORONARY ANGIOGRAPHY;  Surgeon: [place], Peter M, MD;  Location: [facility];  Service: Cardiovascular;  Laterality: N/A;   SAVORY DILATION N/A 04/12/2021   Procedure: SAVORY DILATION;  Surgeon: Kathi Der, MD;  Location: [facility];  Service: Gastroenterology;  Laterality: N/A;   TONSILLECTOMY AND ADENOIDECTOMY     TUBAL LIGATION      Allergies  No Known Allergies  History  of Present Illness    Sharon Vasquez has a PMH of PVD, benign essential hypertension, chronic venous insufficiency, aortic atherosclerosis, acute diastolic CHF, atrial fibrillation, and morbid obesity.   She was seen and evaluated by Dr. Gery Pray 07/12/2016 when she was admitted with diastolic CHF and atrial fibrillation.  She was noted to have a history of treated hyperlipidemia and hypertension.  She had a pulmonary embolus and was placed on Xarelto.  She also had a history of tobacco abuse and continues to smoke.  She had a chest CT 9/19 that showed left main/three-vessel coronary calcification.  She did complain of atypical chest pain.  She underwent a sleep study and was placed on CPAP.      She had a coronary CTA/FFR that showed a physiologically significant lesion in the small nondominant RCA.  She was noted to have gained 20 pounds since she was last seen.  She complained of increased shortness of breath, dyspnea on exertion and orthopnea.  An echocardiogram 4/18 showed normal LV systolic function.   She was last seen by Dr. Allyson Sabal on 10/24/2019.  During that time she reported she continued to do well.  She did however continue to gain weight which she attributed to overeating.  She was compliant with her CPAP.  She was noted to have chronic shortness of breath but denied chest pain.  She was mostly homebound at the time of the visit, she reported compliance with her anticoagulation and she continues to smoke.   Admitted to the hospital with COPD exacerbation 04/10/20-04/15/20. Treated with abx and steroid taper.   She  was seen virtually 05/12/2020 in follow-up and stated she was breathing better since her hospitalization. She completed PT at SNF. She reported she remained sedentary at home.  She slept in a medical bed with head elevated due her sinus congestion.  She reported compliance with her torsemide medication.  She reported that she did not add any extra sodium to her foods and that her sister was  helping with her meals.  She was in the process of trying to get an oxygen concentrator for home use.  She reported that she had a pulmonology appointment the next day.  She  had both COVID-19 vaccines and her booster.  I  gave her the salty 6 diet sheet, continued her current medications, had her increase her physical activity as tolerated (encouraged at least 15 minutes of physical activity daily, and planned her follow-up with Dr. Allyson Sabal in 6 months.  She presented to the emergency department 07/24/2020 with increased shortness of breath which was felt to be secondary to COPD and CHF exacerbation.  She was instructed to contact cardiology to discuss her diuretic medication.  She was seen by pulmonology on 08/03/2020 and instructed to follow-up with cardiology.   She presented to the clinic 08/11/20  for follow-up evaluation stated she was feeling better with her oxygen concentrator at home.  She reported that she had increased her physical activity and was exercising .  She continued to have significant weakness in her legs and had  trouble walking short distances.   She reported her weight was improving and   she was no longer on steroids.  I gave her the salty 6 diet sheet, asked her to continue to increase her physical activity, continued her  medication regimen and planned follow-up in 3 to 4 months.  She was seen in follow-up by Dr. Allyson Sabal on 08/17/2021.  She remained stable at that time.  She continued to have chronic shortness of breath.  She was essentially wheelchair-bound and was using a motorized wheelchair due to her obesity and arthritis.  Her weight remains stable.  Her echocardiogram 7/22 showed normal LV function, moderate LVH, mild pulmonary hypertension, moderate TR and no evidence of MR.      She presented to the clinic 04/12/22 for follow-up evaluation and stated she stopped smoking in October.  She went on a cruise in December to celebrate a relatives birthday.  She presented to the  emergency department 03/28/2022 and received treatment for her shortness of breath as well as IV Rocephin for urinary tract infection.  She noted that her shortness of breath was much less and she was able to recover much better after ADL type activities.  She was continuing to follow a low-sodium diet and trying to be more physically active at home.  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  She enjoyed doing the work.  We reviewed her  lab work and medications.  We reviewed her emergency department visit.  She expressed understanding.  I planned follow-up in 6 months.  She was seen in follow-up by Tonye Becket, NP on 11/16/2022.  She remained stable from a cardiac standpoint at that time.  She did note some shortness of breath with exertion.  She denied orthopnea and PND.  She was limited in her physical activity due to knee and back pain.  She was using an Passenger transport manager.  Her appetite was okay.  She denied fever or chills.  She was taking medications as prescribed.  She was planning to discharge back to her apartment soon.  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  She had undergone catheterization 11/01/2022 which showed proximal LAD lesion 35%, normal LV EDP.  Mild pulmonary hypertension, mild nonobstructive CAD.  Echocardiogram 10/29/2022 showed LV EF less than 20%, left ventricular global hypokinesis, mild LVH and dilation of her ascending aorta measuring 41 mm.  She presents to the clinic today for follow-up evaluation and states***.  Today she denies chest pain, shortness of breath, lower extremity edema, fatigue, palpitations, melena, hematuria, hemoptysis, diaphoresis, weakness, presyncope, syncope, orthopnea, and PND.   HFREF, diastolic CHF-both chronic shortness of breath stable.  Echocardiogram 02/21/2022 showed an EF of 60-65, with intermediate diastolic parameters.  Echocardiogram 7/24 showed  an EF less than 20%, mild LVH, intermediate diastolic parameters, and mild dilation of ascending aorta measuring 41 mm. Continue torsemide, potassium, metoprolol,  Jardiance, losartan Heart healthy low-sodium diet-salty 6  Increase physical activity as tolerated-limited physical activity due to back and knee pain.-Chair exercises.  Coronary artery calcification-denies recent chest tightness or discomfort.  Underwent cardiac catheterization 11/01/2022 and was noted to have proximal LAD stenosis 35% and mild nonobstructive CAD.  LVEDP was normal.   Continue Xarelto, aspirin, metoprolol Heart healthy low-sodium diet   Essential hypertension-BP today 13***1/77.   Continue metoprolol, torsemide, losartan Heart healthy low-sodium diet-salty 6 given Increase physical activity as tolerated   Morbid obesity-reports compliance with Ozempic.  Feels that she is losing weight.  Unable to stand for scale weight. Continue Ozempic Continue weight loss Heart healthy low-sodium diet Increase physical activity as tolerated   Paroxysmal atrial fibrillation-heart rate today 7***7.  Continues to be cardiac unaware.  Compliant with Xarelto and denies bleeding issues. Continue Xarelto Avoid triggers caffeine, chocolate, EtOH, dehydration etc.-reviewed   Coronary artery calcification-denies recent chest tightness or discomfort.  Previously identified on coronary CTA and  CT scan Continue Xarelto, aspirin, metoprolol, atorvastatin Heart healthy low-sodium diet-salty 6 reviewed Increase physical activity as tolerated   COPD-continue smoking cessation.  Continues on 2 L nasal canula Follows with pulmonology  Disposition: Follow-up with Dr. Allyson Sabal or me in 6 months.  Follow-up with heart failure clinic as scheduled.   Home Medications    Prior to Admission medications   Medication Sig Start Date End Date Taking? Authorizing Provider  aspirin EC 81 MG tablet Take 81 mg by mouth daily.    [provider]  atorvastatin (LIPITOR) 40 MG tablet Take 1 tablet (40 mg total) by mouth daily. 12/24/19   Elige Radon, MD  benazepril (LOTENSIN) 40 MG tablet Take 0.5 tablets (20 mg total) by mouth daily. 04/15/20 04/15/21  Remo Lipps, MD  esomeprazole (NEXIUM) 40 MG capsule Take 1 capsule (40 mg total) by mouth daily as needed (for heartburn or indigestion). Patient taking differently: Take 40 mg by mouth daily. 01/20/19   Synetta Fail, MD  Fluticasone-Salmeterol (ADVAIR) 250-50 MCG/DOSE AEPB Inhale 1 puff into the lungs 2 (two) times daily. 06/18/20   Glenford Bayley, NP  gabapentin (NEURONTIN) 400 MG capsule TAKE 1 CAPSULE BY MOUTH  TWICE DAILY Patient taking differently: Take 400 mg by mouth 2 (two) times daily. 02/05/20   Albertha Ghee, MD  ibuprofen (ADVIL) 200 MG tablet Take 800 mg by mouth every 6 (six) hours as needed (inflammation).    [provider]  ipratropium-albuterol (DUONEB) 0.5-2.5 (3) MG/3ML SOLN Take 3 mLs by nebulization every 6 (six) hours as needed. 06/18/20   Glenford Bayley, NP  metoprolol succinate (TOPROL-XL) 50 MG 24 hr tablet TAKE 1 TABLET(50 MG) BY MOUTH DAILY WITH OR IMMEDIATELY FOLLOWING A MEAL 04/20/20   [place], Carley D, DO  mirabegron ER (MYRBETRIQ) 50 MG TB24 tablet Take 1 tablet (50 mg total) by mouth daily. 05/14/20   Remo Lipps, MD  potassium chloride (KLOR-CON) 10 MEQ tablet Take 1 tablet (10 mEq total) by mouth 2 (two) times daily. 05/14/20   Remo Lipps, MD  predniSONE (DELTASONE) 20 MG tablet Take 2 tablets (40 mg total) by mouth daily. Take 40 mg by mouth daily for 3 days, then 20mg  by mouth daily for 3 days, then 10mg  daily for 3 days 07/24/20   Little, Ambrose Finland, MD  PRESCRIPTION MEDICATION Inhale into the lungs at bedtime. cpap    [provider]  rivaroxaban (XARELTO) 20 MG TABS tablet Take 1 tablet (20 mg total) by mouth every morning. 01/23/20   Quincy Simmonds, MD  tiotropium (SPIRIVA HANDIHALER) 18 MCG  inhalation capsule PLACE 1 CAPSULE INTO INHALER AND INHALE DAILY Patient taking differently: Place 18 mcg into inhaler and inhale daily. 12/05/19   Elige Radon, MD  torsemide (DEMADEX) 20 MG tablet TAKE 2 TABLETS BY MOUTH DAILY Patient taking differently: Take 20 mg by mouth 2 (two) times daily. 01/26/20   Lenward Chancellor D, DO    Family History    Family History  Problem Relation Age of Onset   Breast  cancer Mother        before age 63   Hypertension Mother    Hyperlipidemia Mother    Hypertension Maternal Grandfather    Hyperlipidemia Maternal Grandfather    She indicated that the status of her mother is unknown. She indicated that the status of her maternal grandfather is unknown.  Social History    Social History   Socioeconomic History   Marital status: Divorced    Spouse name: Not on file   Number of children: Not on file   Years of education: Not on file   Highest education level: Not on file  Occupational History   Not on file  Tobacco Use   Smoking status: Former    Current packs/day: 0.00    Average packs/day: 2.0 packs/day for 60.4 years (120.8 ttl pk-yrs)    Types: Cigarettes    Start date: 08/16/1961    Quit date: 01/02/2022    Years since quitting: 1.0   Smokeless tobacco: Never   Tobacco comments:    Pt smokes about 4 ciggs daily. Stopped 01/02/2022   Vaping Use   Vaping status: Never Used  Substance and Sexual Activity   Alcohol use: No   Drug use: No   Sexual activity: Not Currently    Partners: Male  Other Topics Concern   Not on file  Social History Narrative   Lives in [facility] in [place]. Lives alone, but is dependent in ADLs/IADLs. Previously had HH PT and RN but dismissed them with plans to use the [employer]. Two daughters live nearby. Previously resided in [place].   Social Determinants of Health   Financial Resource Strain: Medium Risk (11/16/2022)   Overall Financial Resource Strain (CARDIA)    Difficulty of Paying  Living Expenses: Somewhat hard  Food Insecurity: No Food Insecurity (12/01/2022)   Hunger Vital Sign    Worried About Running Out of Food in the Last Year: Never true    Ran Out of Food in the Last Year: Never true  Transportation Needs: No Transportation Needs (12/01/2022)   PRAPARE - Administrator, Civil Service (Medical): No    Lack of Transportation (Non-Medical): No  Physical Activity: Inactive (11/16/2022)   Exercise Vital Sign    Days of Exercise per Week: 0 days    Minutes of Exercise per Session: 0 min  Stress: No Stress Concern Present (12/02/2021)   [employer] of Occupational Health - Occupational Stress Questionnaire    Feeling of Stress : Only a little  Social Connections: Moderately Integrated (03/15/2022)   Social Connection and Isolation Panel [NHANES]    Frequency of Communication with Friends and Family: More than three times a week    Frequency of Social Gatherings with Friends and Family: More than three times a week    Attends Religious Services: More than 4 times per year    Active Member of [employer] or Organizations: Yes    Attends Engineer, structural: More than 4 times per year    Marital Status: Divorced  Intimate Partner Violence: Not At Risk (12/01/2022)   Humiliation, Afraid, Rape, and Kick questionnaire    Fear of Current or Ex-Partner: No    Emotionally Abused: No    Physically Abused: No    Sexually Abused: No     Review of Systems    General:  No chills, fever, night sweats or weight changes.  Cardiovascular:  No chest pain, dyspnea on exertion, edema, orthopnea, palpitations, paroxysmal nocturnal dyspnea. Dermatological:  No rash, lesions/masses Respiratory: No cough, dyspnea Urologic: No hematuria, dysuria Abdominal:   No nausea, vomiting, diarrhea, bright red blood per rectum, melena, or hematemesis Neurologic:  No visual changes, wkns, changes in mental status. All other systems reviewed and are otherwise negative except  as noted above.  Physical Exam    VS:  There were no vitals taken for this visit. , BMI There is no height or weight on file to calculate BMI. GEN: Well nourished, well developed, in no acute distress. HEENT: normal. Neck: Supple, no JVD, carotid bruits, or masses. Cardiac: RRR, no murmurs, rubs, or gallops. No clubbing, cyanosis, chronic venous stasis, generalized bilateral lower extremity edema.  Radials/DP/PT 2+ and equal bilaterally.  Respiratory:  Respirations regular and unlabored, clear to auscultation bilaterally. GI: Soft, nontender, nondistended, BS + x 4. MS: no deformity or atrophy. Skin: warm and dry, no rash. Neuro:  Strength and sensation are intact. Psych: Normal affect.  Accessory Clinical Findings    Recent Labs: 12/01/2022: Magnesium 2.0 12/07/2022: B Natriuretic Peptide 95.9 12/26/2022: ALT 15; Hemoglobin 12.9; Platelets 477 12/28/2022: BUN 13; Creatinine, Ser 0.94; Potassium 3.5; Sodium 140   Recent Lipid Panel    Component Value Date/Time   CHOL 91 (L) 08/25/2021 1006   TRIG 170 (H) 10/29/2022 0423   HDL 26 (L) 08/25/2021 1006   CHOLHDL 3.5 08/25/2021 1006   LDLCALC 46 08/25/2021 1006    ECG personally reviewed by me today-none today.  Echocardiogram 09/30/2019 Echocardiogram 09/30/2019   IMPRESSIONS     1. Left ventricular ejection fraction, by estimation, is 65 to 70%. The  left ventricle has hyperdynamic function. The left ventricle has no  regional wall motion abnormalities. The left ventricular internal cavity  size was mildly dilated. There is mild  concentric left ventricular hypertrophy. Left ventricular diastolic  parameters are consistent with Grade II diastolic dysfunction  (pseudonormalization). Elevated left atrial pressure.   2. Right ventricular systolic function is hyperdynamic. The right  ventricular size is normal.   3. Left atrial size was moderately dilated.   4. The mitral valve is normal in structure. Trivial mitral valve   regurgitation.   5. The aortic valve is normal in structure. Aortic valve regurgitation is  not visualized. Mild aortic valve sclerosis is present, with no evidence  of aortic valve stenosis.   6. The inferior vena cava is dilated in size with >50% respiratory  variability, suggesting right atrial pressure of 8 mmHg.   Comparison(s): Prior images reviewed side by side. The left ventricular  function is unchanged. The left ventricular diastolic function is  significantly worse. Findings are consistent ith high output diastolic  heart failure, such as obesity cardiomyopathy   (should exclude anemia, thyrotoxicosis, AV fistula, etc.).  Echocardiogram 02/21/2022  IMPRESSIONS     1. Left ventricular ejection fraction, by estimation, is 60 to 65%. The  left ventricle has normal function. The left ventricle has no regional  wall motion abnormalities. There is moderate concentric left ventricular  hypertrophy. Left ventricular  diastolic parameters are indeterminate.   2. Right ventricular systolic function is normal. The right ventricular  size is normal. There is normal pulmonary artery systolic pressure.   3. Left atrial size was mildly dilated.   4. The mitral valve is grossly normal. Trivial mitral valve  regurgitation. No evidence of mitral stenosis.   5. The aortic valve is tricuspid. There is mild calcification of the  aortic valve. Aortic valve regurgitation is not visualized. Aortic valve  sclerosis is present,  with no evidence of aortic valve stenosis.   6. There is mild dilatation of the ascending aorta, measuring 40 mm.   7. The inferior vena cava is dilated in size with >50% respiratory  variability, suggesting right atrial pressure of 8 mmHg.   Comparison(s): No significant change from prior study.   Echocardiogram 10/29/2022  IMPRESSIONS     1. LV septum is aneurysmal towards the apex. Left ventricular ejection  fraction, by estimation, is <20%. The left ventricle  has severely  decreased function. The left ventricle demonstrates global hypokinesis.  There is mild left ventricular hypertrophy.  Left ventricular diastolic parameters are indeterminate.   2. Right ventricular systolic function is normal. The right ventricular  size is normal. There is normal pulmonary artery systolic pressure.   3. Left atrial size was moderately dilated.   4. The mitral valve is normal in structure. No evidence of mitral valve  regurgitation.   5. The aortic valve is tricuspid. Aortic valve regurgitation is not  visualized.   6. There is mild dilatation of the ascending aorta, measuring 41 mm.   7. The inferior vena cava is dilated in size with <50% respiratory  variability, suggesting right atrial pressure of 15 mmHg.   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 Left  ventricular ejection fraction, by estimation, is <20%. The left ventricle  has severely decreased function. The left ventricle demonstrates global  hypokinesis. Definity contrast agent was  given IV to delineate the left ventricular endocardial borders. The left  ventricular internal cavity size was normal in size. There is mild left  ventricular hypertrophy. Left ventricular diastolic parameters are  indeterminate.   Right Ventricle: The right ventricular size is normal. Right ventricular  systolic function is normal. There is normal pulmonary artery systolic  pressure. The tricuspid regurgitant velocity is 1.95 m/s, and with an  assumed right atrial pressure of 15  mmHg, the estimated right ventricular systolic pressure is 30.2 mmHg.   Left Atrium: Left atrial size was moderately dilated.   Right Atrium: Right atrial size was normal in size.   Pericardium: There is no evidence of pericardial effusion.   Mitral Valve: The mitral valve is normal in structure. No evidence of  mitral valve regurgitation.   Tricuspid Valve: Tricuspid valve regurgitation is not  demonstrated.   Aortic Valve: The aortic valve is tricuspid. Aortic valve regurgitation is  not visualized.   Pulmonic Valve: Pulmonic valve regurgitation is not visualized.   Aorta: There is mild dilatation of the ascending aorta, measuring 41 mm.   Venous: The inferior vena cava is dilated in size with less than 50%  respiratory variability, suggesting right atrial pressure of 15 mmHg.   IAS/Shunts: No atrial level shunt detected by color flow Doppler.   Echocardiogram 10/29/2022  IMPRESSIONS     1. LV septum is aneurysmal towards the apex. Left ventricular ejection  fraction, by estimation, is <20%. The left ventricle has severely  decreased function. The left ventricle demonstrates global hypokinesis.  There is mild left ventricular hypertrophy.  Left ventricular diastolic parameters are indeterminate.   2. Right ventricular systolic function is normal. The right ventricular  size is normal. There is normal pulmonary artery systolic pressure.   3. Left atrial size was moderately dilated.   4. The mitral valve is normal in structure. No evidence of mitral valve  regurgitation.   5. The aortic valve is tricuspid. Aortic valve regurgitation is not  visualized.   6. There  is mild dilatation of the ascending aorta, measuring 41 mm.   7. The inferior vena cava is dilated in size with <50% respiratory  variability, suggesting right atrial pressure of 15 mmHg.   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 Left  ventricular ejection fraction, by estimation, is <20%. The left ventricle  has severely decreased function. The left ventricle demonstrates global  hypokinesis. Definity contrast agent was  given IV to delineate the left ventricular endocardial borders. The left  ventricular internal cavity size was normal in size. There is mild left  ventricular hypertrophy. Left ventricular diastolic parameters are  indeterminate.   Right Ventricle: The right  ventricular size is normal. Right ventricular  systolic function is normal. There is normal pulmonary artery systolic  pressure. The tricuspid regurgitant velocity is 1.95 m/s, and with an  assumed right atrial pressure of 15  mmHg, the estimated right ventricular systolic pressure is 30.2 mmHg.   Left Atrium: Left atrial size was moderately dilated.   Right Atrium: Right atrial size was normal in size.   Pericardium: There is no evidence of pericardial effusion.   Mitral Valve: The mitral valve is normal in structure. No evidence of  mitral valve regurgitation.   Tricuspid Valve: Tricuspid valve regurgitation is not demonstrated.   Aortic Valve: The aortic valve is tricuspid. Aortic valve regurgitation is  not visualized.   Pulmonic Valve: Pulmonic valve regurgitation is not visualized.   Aorta: There is mild dilatation of the ascending aorta, measuring 41 mm.   Venous: The inferior vena cava is dilated in size with less than 50%  respiratory variability, suggesting right atrial pressure of 15 mmHg.   IAS/Shunts: No atrial level shunt detected by color flow Doppler.   Cardiac catheterization 11/01/2022    Prox LAD lesion is 35% stenosed.   LV end diastolic pressure is normal.   Hemodynamic findings consistent with mild pulmonary hypertension.   Mild nonobstructive CAD Normal LV filling pressures. PCWP 19/22 with mean 16 mm Hg. LVEDP 14 mm Hg Mild pulmonary HTN PAP 40/20 with mean 28 mm Hg Normal RA pressure 9 mm Hg Normal Cardiac output 8.72 L/min, index 3.21   Plan: medical management  Diagnostic Dominance: Left  Intervention     Assessment & Plan   1.  HFpEF, diastolic CHF- chronic generalized bilateral lower extremity edema.  Chronic shortness of breath stable.  Echocardiogram 02/21/2022 showed an EF of 60-65, with intermediate diastolic parameters Continue torsemide, potassium, metoprolol,  Jardiance, losartan Heart healthy low-sodium diet-salty 6  Increase  physical activity as tolerated-discussed improving fitness and being more active  Essential hypertension-BP today 131/77.   Continue metoprolol, torsemide, losartan Heart healthy low-sodium diet-salty 6 given Increase physical activity as tolerated   Morbid obesity-unable to stand for weight.  Continue Ozempic Continue weight loss Heart healthy low-sodium diet Increase physical activity as tolerated Contniue current medication  Paroxysmal atrial fibrillation-heart rate today 77.  Cardiac unaware.  Reports compliance with Xarelto and denies bleeding issues. Continue Xarelto Increase physical activity as tolerated Avoid triggers caffeine, chocolate, EtOH, dehydration etc.   Coronary artery calcification-denies recent chest tightness or discomfort.  Previously identified on coronary CTA and  CT scan Continue Xarelto, aspirin, metoprolol Heart healthy low-sodium diet-salty 6 reviewed Increase physical activity as tolerated   COPD-stopped smoking in October . ED visit 03/28/22 and received steroid tx and DuoNeds.  Less shortness of breath. On 2 L nasal canula Follows with pulmonology  Disposition: Follow-up with Dr. Allyson Sabal or  me in 6 months.  Thomasene Ripple. Lean Fayson NP-C    01/09/2023, 1:01 PM [facility] [address] Office [phone] Fax [phone]  Notice: This dictation was prepared with Dragon dictation along with smaller phrase technology. Any transcriptional errors that result from this process are unintentional and may not be corrected upon review.  I spent 14*** minutes examining this patient, reviewing medications, and using patient centered shared decision making involving her cardiac care.  Prior to her visit I spent greater than 20 minutes reviewing her past medical history,  medications, and prior cardiac tests.

## 2023-01-10 DIAGNOSIS — J449 Chronic obstructive pulmonary disease, unspecified: Secondary | ICD-10-CM | POA: Diagnosis not present

## 2023-01-10 DIAGNOSIS — Z79891 Long term (current) use of opiate analgesic: Secondary | ICD-10-CM | POA: Diagnosis not present

## 2023-01-10 DIAGNOSIS — I700 Atherosclerosis of aorta: Secondary | ICD-10-CM | POA: Diagnosis not present

## 2023-01-10 DIAGNOSIS — M810 Age-related osteoporosis without current pathological fracture: Secondary | ICD-10-CM | POA: Diagnosis not present

## 2023-01-10 DIAGNOSIS — I959 Hypotension, unspecified: Secondary | ICD-10-CM | POA: Diagnosis not present

## 2023-01-10 DIAGNOSIS — J9622 Acute and chronic respiratory failure with hypercapnia: Secondary | ICD-10-CM | POA: Diagnosis not present

## 2023-01-10 DIAGNOSIS — G4733 Obstructive sleep apnea (adult) (pediatric): Secondary | ICD-10-CM | POA: Diagnosis not present

## 2023-01-10 DIAGNOSIS — E119 Type 2 diabetes mellitus without complications: Secondary | ICD-10-CM | POA: Diagnosis not present

## 2023-01-10 DIAGNOSIS — Z7901 Long term (current) use of anticoagulants: Secondary | ICD-10-CM | POA: Diagnosis not present

## 2023-01-10 DIAGNOSIS — K5900 Constipation, unspecified: Secondary | ICD-10-CM | POA: Diagnosis not present

## 2023-01-10 DIAGNOSIS — Z7985 Long-term (current) use of injectable non-insulin antidiabetic drugs: Secondary | ICD-10-CM | POA: Diagnosis not present

## 2023-01-10 DIAGNOSIS — K219 Gastro-esophageal reflux disease without esophagitis: Secondary | ICD-10-CM | POA: Diagnosis not present

## 2023-01-10 DIAGNOSIS — I5023 Acute on chronic systolic (congestive) heart failure: Secondary | ICD-10-CM | POA: Diagnosis not present

## 2023-01-10 DIAGNOSIS — I2510 Atherosclerotic heart disease of native coronary artery without angina pectoris: Secondary | ICD-10-CM | POA: Diagnosis not present

## 2023-01-10 DIAGNOSIS — I272 Pulmonary hypertension, unspecified: Secondary | ICD-10-CM | POA: Diagnosis not present

## 2023-01-10 DIAGNOSIS — E785 Hyperlipidemia, unspecified: Secondary | ICD-10-CM | POA: Diagnosis not present

## 2023-01-10 DIAGNOSIS — I4891 Unspecified atrial fibrillation: Secondary | ICD-10-CM | POA: Diagnosis not present

## 2023-01-10 DIAGNOSIS — G890 Central pain syndrome: Secondary | ICD-10-CM | POA: Diagnosis not present

## 2023-01-10 DIAGNOSIS — J9621 Acute and chronic respiratory failure with hypoxia: Secondary | ICD-10-CM | POA: Diagnosis not present

## 2023-01-10 NOTE — Progress Notes (Cosign Needed)
This encounter was created in error - please disregard.   I called patient and someone answered saying patient was not home and that she had left her cell phone there.  I told unidentified person to tell patient to call the office to reschedule her Medicare AWV.

## 2023-01-11 ENCOUNTER — Ambulatory Visit: Payer: 59 | Attending: General Practice | Admitting: General Practice

## 2023-01-12 DIAGNOSIS — I700 Atherosclerosis of aorta: Secondary | ICD-10-CM | POA: Diagnosis not present

## 2023-01-12 DIAGNOSIS — E119 Type 2 diabetes mellitus without complications: Secondary | ICD-10-CM | POA: Diagnosis not present

## 2023-01-12 DIAGNOSIS — Z7985 Long-term (current) use of injectable non-insulin antidiabetic drugs: Secondary | ICD-10-CM | POA: Diagnosis not present

## 2023-01-12 DIAGNOSIS — J449 Chronic obstructive pulmonary disease, unspecified: Secondary | ICD-10-CM | POA: Diagnosis not present

## 2023-01-12 DIAGNOSIS — I5023 Acute on chronic systolic (congestive) heart failure: Secondary | ICD-10-CM | POA: Diagnosis not present

## 2023-01-12 DIAGNOSIS — I2510 Atherosclerotic heart disease of native coronary artery without angina pectoris: Secondary | ICD-10-CM | POA: Diagnosis not present

## 2023-01-12 DIAGNOSIS — E785 Hyperlipidemia, unspecified: Secondary | ICD-10-CM | POA: Diagnosis not present

## 2023-01-12 DIAGNOSIS — I4891 Unspecified atrial fibrillation: Secondary | ICD-10-CM | POA: Diagnosis not present

## 2023-01-12 DIAGNOSIS — I272 Pulmonary hypertension, unspecified: Secondary | ICD-10-CM | POA: Diagnosis not present

## 2023-01-12 DIAGNOSIS — Z79891 Long term (current) use of opiate analgesic: Secondary | ICD-10-CM | POA: Diagnosis not present

## 2023-01-12 DIAGNOSIS — K5900 Constipation, unspecified: Secondary | ICD-10-CM | POA: Diagnosis not present

## 2023-01-12 DIAGNOSIS — K219 Gastro-esophageal reflux disease without esophagitis: Secondary | ICD-10-CM | POA: Diagnosis not present

## 2023-01-12 DIAGNOSIS — I959 Hypotension, unspecified: Secondary | ICD-10-CM | POA: Diagnosis not present

## 2023-01-12 DIAGNOSIS — J9622 Acute and chronic respiratory failure with hypercapnia: Secondary | ICD-10-CM | POA: Diagnosis not present

## 2023-01-12 DIAGNOSIS — G890 Central pain syndrome: Secondary | ICD-10-CM | POA: Diagnosis not present

## 2023-01-12 DIAGNOSIS — Z7901 Long term (current) use of anticoagulants: Secondary | ICD-10-CM | POA: Diagnosis not present

## 2023-01-12 DIAGNOSIS — J9621 Acute and chronic respiratory failure with hypoxia: Secondary | ICD-10-CM | POA: Diagnosis not present

## 2023-01-12 DIAGNOSIS — G4733 Obstructive sleep apnea (adult) (pediatric): Secondary | ICD-10-CM | POA: Diagnosis not present

## 2023-01-12 DIAGNOSIS — M810 Age-related osteoporosis without current pathological fracture: Secondary | ICD-10-CM | POA: Diagnosis not present

## 2023-01-15 DIAGNOSIS — K5900 Constipation, unspecified: Secondary | ICD-10-CM | POA: Diagnosis not present

## 2023-01-15 DIAGNOSIS — J9621 Acute and chronic respiratory failure with hypoxia: Secondary | ICD-10-CM | POA: Diagnosis not present

## 2023-01-15 DIAGNOSIS — Z79891 Long term (current) use of opiate analgesic: Secondary | ICD-10-CM | POA: Diagnosis not present

## 2023-01-15 DIAGNOSIS — I700 Atherosclerosis of aorta: Secondary | ICD-10-CM | POA: Diagnosis not present

## 2023-01-15 DIAGNOSIS — Z7901 Long term (current) use of anticoagulants: Secondary | ICD-10-CM | POA: Diagnosis not present

## 2023-01-15 DIAGNOSIS — G890 Central pain syndrome: Secondary | ICD-10-CM | POA: Diagnosis not present

## 2023-01-15 DIAGNOSIS — M810 Age-related osteoporosis without current pathological fracture: Secondary | ICD-10-CM | POA: Diagnosis not present

## 2023-01-15 DIAGNOSIS — J9622 Acute and chronic respiratory failure with hypercapnia: Secondary | ICD-10-CM | POA: Diagnosis not present

## 2023-01-15 DIAGNOSIS — I272 Pulmonary hypertension, unspecified: Secondary | ICD-10-CM | POA: Diagnosis not present

## 2023-01-15 DIAGNOSIS — I4891 Unspecified atrial fibrillation: Secondary | ICD-10-CM | POA: Diagnosis not present

## 2023-01-15 DIAGNOSIS — Z7985 Long-term (current) use of injectable non-insulin antidiabetic drugs: Secondary | ICD-10-CM | POA: Diagnosis not present

## 2023-01-15 DIAGNOSIS — K219 Gastro-esophageal reflux disease without esophagitis: Secondary | ICD-10-CM | POA: Diagnosis not present

## 2023-01-15 DIAGNOSIS — I5023 Acute on chronic systolic (congestive) heart failure: Secondary | ICD-10-CM | POA: Diagnosis not present

## 2023-01-15 DIAGNOSIS — I959 Hypotension, unspecified: Secondary | ICD-10-CM | POA: Diagnosis not present

## 2023-01-15 DIAGNOSIS — E785 Hyperlipidemia, unspecified: Secondary | ICD-10-CM | POA: Diagnosis not present

## 2023-01-15 DIAGNOSIS — J449 Chronic obstructive pulmonary disease, unspecified: Secondary | ICD-10-CM | POA: Diagnosis not present

## 2023-01-15 DIAGNOSIS — I2510 Atherosclerotic heart disease of native coronary artery without angina pectoris: Secondary | ICD-10-CM | POA: Diagnosis not present

## 2023-01-15 DIAGNOSIS — G4733 Obstructive sleep apnea (adult) (pediatric): Secondary | ICD-10-CM | POA: Diagnosis not present

## 2023-01-15 DIAGNOSIS — E119 Type 2 diabetes mellitus without complications: Secondary | ICD-10-CM | POA: Diagnosis not present

## 2023-01-17 ENCOUNTER — Telehealth: Payer: Self-pay | Admitting: *Deleted

## 2023-01-17 DIAGNOSIS — G4733 Obstructive sleep apnea (adult) (pediatric): Secondary | ICD-10-CM | POA: Diagnosis not present

## 2023-01-17 DIAGNOSIS — J9622 Acute and chronic respiratory failure with hypercapnia: Secondary | ICD-10-CM | POA: Diagnosis not present

## 2023-01-17 DIAGNOSIS — E119 Type 2 diabetes mellitus without complications: Secondary | ICD-10-CM | POA: Diagnosis not present

## 2023-01-17 DIAGNOSIS — I700 Atherosclerosis of aorta: Secondary | ICD-10-CM | POA: Diagnosis not present

## 2023-01-17 DIAGNOSIS — K219 Gastro-esophageal reflux disease without esophagitis: Secondary | ICD-10-CM | POA: Diagnosis not present

## 2023-01-17 DIAGNOSIS — G890 Central pain syndrome: Secondary | ICD-10-CM | POA: Diagnosis not present

## 2023-01-17 DIAGNOSIS — E785 Hyperlipidemia, unspecified: Secondary | ICD-10-CM | POA: Diagnosis not present

## 2023-01-17 DIAGNOSIS — J449 Chronic obstructive pulmonary disease, unspecified: Secondary | ICD-10-CM | POA: Diagnosis not present

## 2023-01-17 DIAGNOSIS — J9621 Acute and chronic respiratory failure with hypoxia: Secondary | ICD-10-CM | POA: Diagnosis not present

## 2023-01-17 DIAGNOSIS — I272 Pulmonary hypertension, unspecified: Secondary | ICD-10-CM | POA: Diagnosis not present

## 2023-01-17 DIAGNOSIS — Z7901 Long term (current) use of anticoagulants: Secondary | ICD-10-CM | POA: Diagnosis not present

## 2023-01-17 DIAGNOSIS — I2510 Atherosclerotic heart disease of native coronary artery without angina pectoris: Secondary | ICD-10-CM | POA: Diagnosis not present

## 2023-01-17 DIAGNOSIS — I959 Hypotension, unspecified: Secondary | ICD-10-CM | POA: Diagnosis not present

## 2023-01-17 DIAGNOSIS — K5900 Constipation, unspecified: Secondary | ICD-10-CM | POA: Diagnosis not present

## 2023-01-17 DIAGNOSIS — I4891 Unspecified atrial fibrillation: Secondary | ICD-10-CM | POA: Diagnosis not present

## 2023-01-17 DIAGNOSIS — Z79891 Long term (current) use of opiate analgesic: Secondary | ICD-10-CM | POA: Diagnosis not present

## 2023-01-17 DIAGNOSIS — I5023 Acute on chronic systolic (congestive) heart failure: Secondary | ICD-10-CM | POA: Diagnosis not present

## 2023-01-17 DIAGNOSIS — Z7985 Long-term (current) use of injectable non-insulin antidiabetic drugs: Secondary | ICD-10-CM | POA: Diagnosis not present

## 2023-01-17 DIAGNOSIS — M810 Age-related osteoporosis without current pathological fracture: Secondary | ICD-10-CM | POA: Diagnosis not present

## 2023-01-17 NOTE — Telephone Encounter (Signed)
Call from [facility] discharging patient from their service.  Patient requesting to do Out Patient PT at [facility] on [facility].  Fax # for order is [phone].

## 2023-01-18 ENCOUNTER — Ambulatory Visit: Payer: Self-pay

## 2023-01-18 DIAGNOSIS — J449 Chronic obstructive pulmonary disease, unspecified: Secondary | ICD-10-CM | POA: Diagnosis not present

## 2023-01-18 NOTE — Patient Instructions (Signed)
Visit Information  Thank you for taking time to visit with me today. Please don't hesitate to contact me if I can be of assistance to you.   Following are the goals we discussed today:   Goals Addressed             This Visit's Progress    I want to monitor and control my conditions       Patient Goals/Self Care Activities: -Patient/Caregiver will take medications as prescribed   -Patient/Caregiver will attend all scheduled provider appointments -Patient/Caregiver will call pharmacy for medication refills 3-7 days in advance of running out of medications -Patient/Caregiver will call provider office for new concerns or questions  -Patient/Caregiver will focus on medication adherence by taking medications as prescribed  -Weigh daily and record (notify MD with 3 lb weight gain over night or 5 lb in a week) -Follow CHF Action Plan -Adhere to low sodium diet  -Keep your airway clear from mucus build up  -Use a humidifier, if needed -Self assess COPD action plan zone and make appointment with provider if you have been in the yellow zone for 48 hours without improvement.  -Take the medications prescribed to control your heart rate and rhythm and reduce your risk of blood clot formation (anticoagulants or antiplatelet medications/"blood thinners"). -Learn to check your pulse and write down the number every day.           Our next appointment is by telephone on 02/22/23 at 11 am  Please call the care guide team at [phone] if you need to cancel or reschedule your appointment.   If you are experiencing a Mental Health or Behavioral Health Crisis or need someone to talk to, please call 1-800-273-TALK (toll free, 24 hour hotline)  Patient verbalizes understanding of instructions and care plan provided today and agrees to view in MyChart. Active MyChart status and patient understanding of how to access instructions and care plan via MyChart confirmed with patient.     Juanell Fairly RN,  BSN, [facility] [employer] [employer] Phone: [phone]

## 2023-01-18 NOTE — Patient Outreach (Signed)
Care Coordination   Follow Up Visit Note   01/18/2023 Name: Sharon Vasquez [name]: [id number] DOB: 05-30-1950  Sharon Vasquez is a 72 y.o. year old female who sees Atway, Rayann N, DO for primary care. I spoke with  Sharon Vasquez by phone today.  What matters to the patients health and wellness today?  Today, I spoke with Sharon Vasquez, who informed me that she visited the emergency department on September 24th due to blood-tinged sputum that lasted for about an hour. Since then, the issue has been resolved, and she hasn't experienced any further problems. Her blood pressure has been stable; she reported it was 140/70 yesterday.  Sharon Vasquez mentioned having some issues with [employer], but her rides have been extended until October 31st when her paperwork can be redone. She will have assistance with this after that date. Additionally, she has an appointment with [facility] on October 31st.  She also wants to schedule a mammogram due to experiencing pain on both sides of her breasts and plans to discuss this with her primary care provider. Although she has finished physical therapy, she noted that she gets short-winded during exertion. Her occupational therapy concluded two weeks ago.  Sharon Vasquez is taking her medications daily, including her diuretic, and continues to use her oxygen at 2 liters without any other issues.    Goals Addressed             This Visit's Progress    I want to monitor and control my conditions       Patient Goals/Self Care Activities: -Patient/Caregiver will take medications as prescribed   -Patient/Caregiver will attend all scheduled provider appointments -Patient/Caregiver will call pharmacy for medication refills 3-7 days in advance of running out of medications -Patient/Caregiver will call provider office for new concerns or questions  -Patient/Caregiver will focus on medication adherence by taking medications as prescribed  -Weigh daily and record  (notify MD with 3 lb weight gain over night or 5 lb in a week) -Follow CHF Action Plan -Adhere to low sodium diet  -Keep your airway clear from mucus build up  -Use a humidifier, if needed -Self assess COPD action plan zone and make appointment with provider if you have been in the yellow zone for 48 hours without improvement.  -Take the medications prescribed to control your heart rate and rhythm and reduce your risk of blood clot formation (anticoagulants or antiplatelet medications/"blood thinners"). -Learn to check your pulse and write down the number every day.           SDOH assessments and interventions completed:  No     Care Coordination Interventions:  Yes, provided   Interventions Today    Flowsheet Row Most Recent Value  Chronic Disease   Chronic disease during today's visit Chronic Obstructive Pulmonary Disease (COPD), Congestive Heart Failure (CHF), Hypertension (HTN)  General Interventions   General Interventions Discussed/Reviewed General Interventions Discussed  Education Interventions   Education Provided Provided Education  Provided Verbal Education On Nutrition  Pharmacy Interventions   Pharmacy Dicussed/Reviewed Pharmacy Topics Discussed  Safety Interventions   Safety Discussed/Reviewed Safety Discussed        Follow up plan: Follow up call scheduled for 02/22/23  11 am    Encounter Outcome:  Patient Visit Completed   Juanell Fairly RN, BSN, [facility]   Care Coordinator Phone: [phone]

## 2023-01-19 DIAGNOSIS — G4733 Obstructive sleep apnea (adult) (pediatric): Secondary | ICD-10-CM | POA: Diagnosis not present

## 2023-01-20 DIAGNOSIS — J9611 Chronic respiratory failure with hypoxia: Secondary | ICD-10-CM | POA: Diagnosis not present

## 2023-01-20 DIAGNOSIS — R2681 Unsteadiness on feet: Secondary | ICD-10-CM | POA: Diagnosis not present

## 2023-01-20 DIAGNOSIS — M170 Bilateral primary osteoarthritis of knee: Secondary | ICD-10-CM | POA: Diagnosis not present

## 2023-01-20 DIAGNOSIS — G4733 Obstructive sleep apnea (adult) (pediatric): Secondary | ICD-10-CM | POA: Diagnosis not present

## 2023-01-20 DIAGNOSIS — J9601 Acute respiratory failure with hypoxia: Secondary | ICD-10-CM | POA: Diagnosis not present

## 2023-01-24 DIAGNOSIS — J449 Chronic obstructive pulmonary disease, unspecified: Secondary | ICD-10-CM | POA: Diagnosis not present

## 2023-01-24 DIAGNOSIS — I509 Heart failure, unspecified: Secondary | ICD-10-CM | POA: Diagnosis not present

## 2023-01-24 DIAGNOSIS — M25562 Pain in left knee: Secondary | ICD-10-CM | POA: Diagnosis not present

## 2023-01-24 DIAGNOSIS — I10 Essential (primary) hypertension: Secondary | ICD-10-CM | POA: Diagnosis not present

## 2023-01-24 DIAGNOSIS — R030 Elevated blood-pressure reading, without diagnosis of hypertension: Secondary | ICD-10-CM | POA: Diagnosis not present

## 2023-01-24 DIAGNOSIS — G8929 Other chronic pain: Secondary | ICD-10-CM | POA: Diagnosis not present

## 2023-01-24 DIAGNOSIS — Z79899 Other long term (current) drug therapy: Secondary | ICD-10-CM | POA: Diagnosis not present

## 2023-01-24 DIAGNOSIS — I4891 Unspecified atrial fibrillation: Secondary | ICD-10-CM | POA: Diagnosis not present

## 2023-01-24 DIAGNOSIS — E119 Type 2 diabetes mellitus without complications: Secondary | ICD-10-CM | POA: Diagnosis not present

## 2023-01-24 DIAGNOSIS — M25561 Pain in right knee: Secondary | ICD-10-CM | POA: Diagnosis not present

## 2023-01-24 DIAGNOSIS — J439 Emphysema, unspecified: Secondary | ICD-10-CM | POA: Diagnosis not present

## 2023-01-25 NOTE — Telephone Encounter (Signed)
CSW called pt and check in- reports no current needs- appreciates CSW call.  Will reach out if she has further concerns  Burna Sis, LCSW Clinical Social Worker [facility] Desk#: [phone] Cell#: [phone]

## 2023-01-29 DIAGNOSIS — Z79899 Other long term (current) drug therapy: Secondary | ICD-10-CM | POA: Diagnosis not present

## 2023-02-01 ENCOUNTER — Encounter (HOSPITAL_COMMUNITY): Payer: Self-pay

## 2023-02-01 ENCOUNTER — Ambulatory Visit (HOSPITAL_COMMUNITY)
Admission: RE | Admit: 2023-02-01 | Discharge: 2023-02-01 | Disposition: A | Discharge status: Home / Self Care | Payer: 59 | Source: Ambulatory Visit | Attending: Physician Assistant | Admitting: Physician Assistant

## 2023-02-01 VITALS — BP 99/69 | HR 86

## 2023-02-01 DIAGNOSIS — I482 Chronic atrial fibrillation, unspecified: Secondary | ICD-10-CM | POA: Diagnosis not present

## 2023-02-01 DIAGNOSIS — I5020 Unspecified systolic (congestive) heart failure: Secondary | ICD-10-CM

## 2023-02-01 DIAGNOSIS — J449 Chronic obstructive pulmonary disease, unspecified: Secondary | ICD-10-CM | POA: Insufficient documentation

## 2023-02-01 DIAGNOSIS — I5032 Chronic diastolic (congestive) heart failure: Secondary | ICD-10-CM | POA: Diagnosis not present

## 2023-02-01 DIAGNOSIS — Z87891 Personal history of nicotine dependence: Secondary | ICD-10-CM | POA: Insufficient documentation

## 2023-02-01 DIAGNOSIS — J84115 Respiratory bronchiolitis interstitial lung disease: Secondary | ICD-10-CM | POA: Diagnosis not present

## 2023-02-01 DIAGNOSIS — Z86711 Personal history of pulmonary embolism: Secondary | ICD-10-CM | POA: Diagnosis not present

## 2023-02-01 DIAGNOSIS — G4733 Obstructive sleep apnea (adult) (pediatric): Secondary | ICD-10-CM | POA: Insufficient documentation

## 2023-02-01 DIAGNOSIS — J9611 Chronic respiratory failure with hypoxia: Secondary | ICD-10-CM | POA: Diagnosis not present

## 2023-02-01 DIAGNOSIS — I428 Other cardiomyopathies: Secondary | ICD-10-CM | POA: Diagnosis not present

## 2023-02-01 DIAGNOSIS — Z9981 Dependence on supplemental oxygen: Secondary | ICD-10-CM | POA: Insufficient documentation

## 2023-02-01 DIAGNOSIS — K219 Gastro-esophageal reflux disease without esophagitis: Secondary | ICD-10-CM | POA: Insufficient documentation

## 2023-02-01 DIAGNOSIS — Z86718 Personal history of other venous thrombosis and embolism: Secondary | ICD-10-CM | POA: Diagnosis not present

## 2023-02-01 DIAGNOSIS — Z7901 Long term (current) use of anticoagulants: Secondary | ICD-10-CM | POA: Diagnosis not present

## 2023-02-01 DIAGNOSIS — Z853 Personal history of malignant neoplasm of breast: Secondary | ICD-10-CM | POA: Diagnosis not present

## 2023-02-01 DIAGNOSIS — R9431 Abnormal electrocardiogram [ECG] [EKG]: Secondary | ICD-10-CM | POA: Insufficient documentation

## 2023-02-01 LAB — BASIC METABOLIC PANEL
Anion gap: 10 (ref 5–15)
BUN: 17 mg/dL (ref 8–23)
CO2: 26 mmol/L (ref 22–32)
Calcium: 9.2 mg/dL (ref 8.9–10.3)
Chloride: 101 mmol/L (ref 98–111)
Creatinine, Ser: 0.98 mg/dL (ref 0.44–1.00)
GFR, Estimated: >60 mL/min (ref >60)
Glucose, Bld: 73 mg/dL (ref 70–99)
Potassium: 3.6 mmol/L (ref 3.5–5.1)
Sodium: 137 mmol/L (ref 135–145)

## 2023-02-01 LAB — BRAIN NATRIURETIC PEPTIDE: B Natriuretic Peptide: 65.2 pg/mL (ref 0.0–100.0)

## 2023-02-01 MED ORDER — TORSEMIDE 20 MG PO TABS: 60.0000 mg | ORAL_TABLET | Freq: Every day | ORAL | 3 refills | Status: DC

## 2023-02-01 MED ORDER — SPIRONOLACTONE 25 MG PO TABS: 25.0000 mg | ORAL_TABLET | Freq: Every day | ORAL | 3 refills | Status: DC

## 2023-02-01 NOTE — Progress Notes (Addendum)
HEART & VASCULAR TRANSITION OF CARE CLINIC NOTE     Referring Physician:Dr Hoffman  Primary Care: Dr Ned Card  Primary Cardiologist: Dr Allyson Sabal  Oncology: Dr Pamelia Hoit   HPI: Referred to clinic by Dr Mikey Bussing for heart failure consultation.   Ms Frantz is a 72 year old with a history of HFpEF, chronic respiratory failure on home O2, COPD, ? RB-ILD followed by pulmonology, chronic Afib, severe OSA (not currently treated), GERD, morbid obesity, hx PE/DVT, tobacco use and breast cancer.    Admitted 01/24 with acute on chronic hypoxic respiratory failure 2/2 hypertensive urgency and acute on chronic HFpEF. She was diuresed and home meds restarted. Apparently had not been taking medications consistently.  Seen in ED 07/04 with elevated blood pressure, lower extremity edema and shortness of breath. There was concern for medication adherence. She was given IV lasix and discharged.   Admitted 10/28/22 with acute hypoxic respiratory failure requiring intubation. Had been off CPAP as machine had stopped working. She was in atrial fibrillation with RVR on presentation. Hospital course c/b respiratory arrest due to mucus plug and developed new acute HFrEF.  Echo showed EF 20% with WMA in LAD territory, possibly due to stress cardiomyopathy. Cath with minimal disease. She was diuresed and started on GDMT. SGLT2 not started d/t UTI.  Seen in [facility] for hospital follow-up 11/16/22. She was volume up. Torsemide increased to 60 mg daily. She was started on Jardiance.  She was readmitted 08/30-08/31/24 for severe hypokalemia. Potassium was 2.4. She received IV supplementation.   Seen in [facility] for follow-up 09/05. Volume okay. Started spironolactone 12.5 mg daily. Concern for medication compliance. [name] later increased to 25 mg daily d/t hypokalemia.  Here today for follow-up. Doing well from HF standpoint. Taking all medications as prescribed except she sometimes forgets her evening dose of potassium before bed. She  is able to stand but reports her legs are stiff limiting her ability to walk. Recently completed home PT/OT. Interested in cardiac rehab. No dyspnea at rest. No orthopnea or PND. Now using CPAP nightly. Leg edema has recently improved.   Does not use salt shaker. Used to eat chicken pot pies twice a day, cutting back on this. Drinks a lot of fluid, meds make her thirsty.   Lives alone and is able to complete ADLs slowly. Her apartment is handicap accessible. 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    Cardiac Testing  Cath 11/01/22    Prox LAD lesion is 35% stenosed.   LV end diastolic pressure is normal.   Hemodynamic findings consistent with mild pulmonary hypertension.  Mild nonobstructive CAD Normal LV filling pressures. PCWP 19/22 with mean 16 mm Hg. LVEDP 14 mm Hg Mild pulmonary HTN PAP 40/20 with mean 28 mm Hg Normal RA pressure 9 mm Hg Normal Cardiac output 8.72 L/min, index 3.21  Echo 10/29/22   1. LV septum is aneurysmal towards the apex. Left ventricular ejection  fraction, by estimation, is <20%. The left ventricle has severely  decreased function. The left ventricle demonstrates global hypokinesis.  There is mild left ventricular hypertrophy.  Left ventricular diastolic parameters are indeterminate.   2. Right ventricular systolic function is normal. The right ventricular  size is normal. There is normal pulmonary artery systolic pressure.   3. Left atrial size was moderately dilated.   4. The mitral valve is normal in structure. No evidence of mitral valve  regurgitation.   5. The aortic valve is tricuspid. Aortic valve regurgitation is not  visualized.  6. There is mild dilatation of the ascending aorta, measuring 41 mm.   Echo 04/2022   1. Left ventricular ejection fraction, by estimation, is 60 to 65%. The  left ventricle has normal function. The left ventricle has no regional  wall motion abnormalities. Left ventricular diastolic parameters are   indeterminate.   2. Right ventricular systolic function is normal. The right ventricular  size is normal. There is mildly elevated pulmonary artery systolic  pressure. The estimated right ventricular systolic pressure is 36.5 mmHg.   3. No evidence of mitral valve regurgitation.     Past Medical History:  Diagnosis Date   A-fib (HCC)    Acute hypoxemic respiratory failure (HCC) 02/20/2022   Acute on chronic hypoxic respiratory failure (HCC) 01/04/2022   Acute upper respiratory infection 01/08/2022   Anxiety    Arthritis    "qwhre; joints, back" (04/17/2017)   Atrial fibrillation (HCC)    Benign breast cyst in female, left 01/08/2017   Found by Screening mammogram, evaluated by U/S on 01/08/17 and determined to be a benign simple breast cyst.   Breast cancer (HCC)    Cellulitis of left lower leg 05/30/2017   CHF (congestive heart failure) (HCC)    Chronic low back pain 08/21/2016   Chronic lower back pain    Chronic venous insufficiency    /notes 05/30/2017   COPD (chronic obstructive pulmonary disease) (HCC)    Depression    Diabetes (HCC) 01/08/2022   Diabetes mellitus without complication (HCC)    DVT (deep venous thrombosis) (HCC) 11/16/2016   Dysrhythmia    Esophageal dysmotility 11/10/2019   Previous workup by ENT with fiberoptic leryngoscopy which was normal. Dx with laryngeal pharyngeal reflux.   GERD (gastroesophageal reflux disease)    Headache    "weekly for the last 3 months" (04/17/2017)   History of pulmonary embolism    Pulmonary embolism   Hyperlipidemia    Hypertension    Laryngopharyngeal reflux 12/18/2015   Morbid obesity (HCC)    PE (pulmonary embolism)    Pulmonary embolism (HCC) 09/21/2014   Sleep apnea    Stroke (HCC) 12/02/2021    Current Outpatient Medications  Medication Sig Dispense Refill   albuterol (VENTOLIN HFA) 108 (90 Base) MCG/ACT inhaler Inhale 2 puffs into the lungs every 4 (four) hours as needed for shortness of breath. 18 g 1    alendronate (FOSAMAX) 70 MG tablet Take 70 mg by mouth once a week. Take with a full glass of water on an empty stomach, Patient hasn't started medication.     aspirin EC 81 MG tablet Take 81 mg by mouth daily.     atorvastatin (LIPITOR) 80 MG tablet Take 1 tablet (80 mg total) by mouth daily. 100 tablet 2   empagliflozin (JARDIANCE) 10 MG TABS tablet Take 1 tablet (10 mg total) by mouth daily before breakfast. 30 tablet 2   esomeprazole (NEXIUM) 40 MG capsule Take 1 capsule (40 mg total) by mouth daily as needed (for heartburn or indigestion). (Patient taking differently: Take 40 mg by mouth daily.) 90 capsule 1   Fluticasone-Umeclidin-Vilant (TRELEGY ELLIPTA) 100-62.5-25 MCG/ACT AEPB Inhale 1 puff into the lungs daily. 60 each 5   gabapentin (NEURONTIN) 400 MG capsule Take 1 capsule (400 mg total) by mouth 2 (two) times daily. 60 capsule 2   letrozole (FEMARA) 2.5 MG tablet TAKE 1 TABLET(2.5 MG) BY MOUTH DAILY (Patient taking differently: Take 2.5 mg by mouth daily. TAKE 1 TABLET(2.5 MG) BY MOUTH DAILY) 90 tablet 3  LORazepam (ATIVAN) 0.5 MG tablet Take 0.5 mg by mouth daily as needed for anxiety.     losartan (COZAAR) 25 MG tablet Take 1 tablet (25 mg total) by mouth daily. (Patient taking differently: Take 25 mg by mouth daily. Taking 12.5 mg if BP low) 30 tablet 11   metoprolol succinate (TOPROL-XL) 25 MG 24 hr tablet Take 1 tablet (25 mg total) by mouth daily. 100 tablet 2   naloxone (NARCAN) nasal spray 4 mg/0.1 mL Place 1 spray into the nose once as needed (for overdose suspected). for opioid overdose     oxybutynin (DITROPAN) 5 MG tablet Take 1 tablet (5 mg total) by mouth 3 (three) times daily.     oxyCODONE (ROXICODONE) 15 MG immediate release tablet Take 15 mg by mouth See admin instructions. Take 15mg  (1 tablet) by mouth every morning and 15mg  (1 tablet) at bedtime in addition to twice during the day as needed.     OXYGEN Inhale 2-3 L into the lungs daily.     potassium chloride SA  (KLOR-CON M) 20 MEQ tablet Take 2 tablets (40 mEq total) by mouth 2 (two) times daily. 90 tablet 3   PRESCRIPTION MEDICATION Inhale into the lungs at bedtime. cpap     rivaroxaban (XARELTO) 20 MG TABS tablet Take 1 tablet (20 mg total) by mouth every morning. 90 tablet 3   Semaglutide-Weight Management (WEGOVY) 1 MG/0.5ML SOAJ Inject 1 mg into the skin once a week. 2 mL 3   spironolactone (ALDACTONE) 25 MG tablet Take 1 tablet (25 mg total) by mouth daily. 90 tablet 3   torsemide (DEMADEX) 20 MG tablet Take 3 tablets (60 mg total) by mouth daily. 90 tablet 3   No current facility-administered medications for this encounter.    No Known Allergies    Social History   Socioeconomic History   Marital status: Divorced    Spouse name: Not on file   Number of children: Not on file   Years of education: Not on file   Highest education level: Not on file  Occupational History   Not on file  Tobacco Use   Smoking status: Former    Current packs/day: 0.00    Average packs/day: 2.0 packs/day for 60.4 years (120.8 ttl pk-yrs)    Types: Cigarettes    Start date: 08/16/1961    Quit date: 01/02/2022    Years since quitting: 1.0   Smokeless tobacco: Never   Tobacco comments:    Pt smokes about 4 ciggs daily. Stopped 01/02/2022   Vaping Use   Vaping status: Never Used  Substance and Sexual Activity   Alcohol use: No   Drug use: No   Sexual activity: Not Currently    Partners: Male  Other Topics Concern   Not on file  Social History Narrative   Lives in [facility] in [place]. Lives alone, but is dependent in ADLs/IADLs. Previously had HH PT and RN but dismissed them with plans to use the [employer]. Two daughters live nearby. Previously resided in [place].   Social Determinants of Health   Financial Resource Strain: Medium Risk (11/16/2022)   Overall Financial Resource Strain (CARDIA)    Difficulty of Paying Living Expenses: Somewhat hard  Food Insecurity: No Food Insecurity  (12/01/2022)   Hunger Vital Sign    Worried About Running Out of Food in the Last Year: Never true    Ran Out of Food in the Last Year: Never true  Transportation Needs: No Transportation Needs (12/01/2022)  PRAPARE - Administrator, Civil Service (Medical): No    Lack of Transportation (Non-Medical): No  Physical Activity: Inactive (11/16/2022)   Exercise Vital Sign    Days of Exercise per Week: 0 days    Minutes of Exercise per Session: 0 min  Stress: No Stress Concern Present (12/02/2021)   [employer] of Occupational Health - Occupational Stress Questionnaire    Feeling of Stress : Only a little  Social Connections: Moderately Integrated (03/15/2022)   Social Connection and Isolation Panel [NHANES]    Frequency of Communication with Friends and Family: More than three times a week    Frequency of Social Gatherings with Friends and Family: More than three times a week    Attends Religious Services: More than 4 times per year    Active Member of [employer] or Organizations: Yes    Attends Engineer, structural: More than 4 times per year    Marital Status: Divorced  Catering manager Violence: Not At Risk (12/01/2022)   Humiliation, Afraid, Rape, and Kick questionnaire    Fear of Current or Ex-Partner: No    Emotionally Abused: No    Physically Abused: No    Sexually Abused: No      Family History  Problem Relation Age of Onset   Breast cancer Mother        before age 33   Hypertension Mother    Hyperlipidemia Mother    Hypertension Maternal Grandfather    Hyperlipidemia Maternal Grandfather     Vitals:   02/01/23 1047  BP: 99/69  Pulse: 86  SpO2: 96%    Unable to obtain reliable weight - patient unable to stand for prolonged period.  Wt Readings from Last 3 Encounters:  12/07/22 (!) 158.8 kg (350 lb)  12/01/22 (!) 150.2 kg (331 lb 2.1 oz)  11/29/22 (!) 153.7 kg (338 lb 12.8 oz)     PHYSICAL EXAM: General:  No distress. Chronically ill  appearing. Arrived in scooter. HEENT: Poor dentition. Neck: supple. Thick neck. Carotids 2+ bilat; no bruits. No lymphadenopathy or thryomegaly appreciated. Cor: PMI nondisplaced. Irregular rhythm. No rubs, gallops or murmurs. Lungs: clear Abdomen: obese, soft, nontender, nondistended.  Extremities: no cyanosis, clubbing, rash, 1+ edema, chronic venous stasis changes Neuro: alert & orientedx3, cranial nerves grossly intact. Affect pleasant    ECG: Afib 71 bpm  ASSESSMENT & PLAN: 1. HFrEF, NICM  - Echo 07/24: EF < 20%. LV septum aneurysmal towards apex. RV normal (EF 60-65% in 01/24). - Etiology unclear but possible stress cardiomyopathy. Cath with 35% prox CAD. CO 8.7 CI 3.2  - NYHA III, confounded by deconditioning and obesity - Seems to be better with medication compliance - Volume looks okay today. Continue Torsemide 60 mg daily.  - Continue Toprol XL 25 mg daily - Continue Spiro 25 mg daily - Continue Jardiance 10 mg daily. Watch for UTIs. Reports episodes of incontinence. - Continue Losartan 25 mg daily - Cut back fluid and sodium intake - Repeat echo  - Would not be a candidate for advanced therapies - Referred for cardiac rehab  2. A fib , chronic  -She has been in A fib on EKGs dating back to 2022.  -Rate controlled. Continue current dose of Toprol XL.  -Continue xarelto.  3. Obesity  - Last weight 350 lb. Unable to stand on scale today. - Continue [id number]. Also dicussed diet and increasing activity.  4. Chronic Hypoxic Respiratory Failure on home oxygen.  -On 2 liters [place]. Sats  stable.   5. OSA, severe -Continue CPAP  6. COPD Possible RB-ILD -Followed by Pulmonary   Follow-up: Establish with Dr. Elwyn Lade in 1 month with echo. If EF improving and without recurrent hospitalizations for heart failure may be able to refer back to Cardiology for follow-up  Jillienne Egner N PA-C 1:27 PM

## 2023-02-01 NOTE — Addendum Note (Signed)
Addended by: Lucille Passy on: 02/01/2023 04:11 PM   Modules accepted: Orders

## 2023-02-01 NOTE — Patient Instructions (Signed)
No change in medications today. Labs today - will call you if abnormal. Refills sent on torsemide and spiro. Referral sent to Cardiac Rehab - they will call you. See below. Return to see Dr. Elwyn Lade with echo in December - see below.  PLEASE CALL us AT [phone] IF ANY QUESTIONS OR CONCERNS PRIOR TO YOUR NEXT VISIT.

## 2023-02-05 ENCOUNTER — Telehealth (HOSPITAL_COMMUNITY): Payer: Self-pay

## 2023-02-05 NOTE — Telephone Encounter (Signed)
Pt insurance is active and benefits verified through [facility] dual Co-pay $0, DED $240/$240 met, out of pocket $8850/$1535.50 met, co-insurance 20%. no pre-authorization required. Passport, 02/05/2023@1 :56, REF# [id number]   How many CR sessions are covered? 72 ICR Is this a lifetime maximum or an annual maximum? Lifetime Has the member used any of these services to date? no Is there a time limit (weeks/months) on start of program and/or program completion? no     Will contact patient to see if she is interested in the Cardiac Rehab Program. If interested, patient will need to complete follow up appt. Once completed, patient will be contacted for scheduling upon review by the RN Navigator.

## 2023-02-05 NOTE — Telephone Encounter (Signed)
Office Referral recv'd and verified for MD signature.

## 2023-02-05 NOTE — Telephone Encounter (Signed)
Called patient to see if she is interested in the Cardiac Rehab Program. Patient expressed interest but did advise [place] she is in a wheelchair. After talking to her she is able to stand and pivot. She can not walk. I Explained scheduling process and went over insurance, patient verbalized understanding. Someone from our cardiac rehab staff will contact pt at a later time.

## 2023-02-07 ENCOUNTER — Emergency Department (HOSPITAL_COMMUNITY)
Admission: EM | Admit: 2023-02-07 | Discharge: 2023-02-08 | Disposition: A | Discharge status: Home / Self Care | Payer: 59 | Attending: Emergency Medicine | Admitting: Emergency Medicine

## 2023-02-07 ENCOUNTER — Emergency Department (HOSPITAL_COMMUNITY): Payer: 59

## 2023-02-07 DIAGNOSIS — Z7951 Long term (current) use of inhaled steroids: Secondary | ICD-10-CM | POA: Diagnosis not present

## 2023-02-07 DIAGNOSIS — R0602 Shortness of breath: Secondary | ICD-10-CM | POA: Diagnosis not present

## 2023-02-07 DIAGNOSIS — I509 Heart failure, unspecified: Secondary | ICD-10-CM | POA: Diagnosis not present

## 2023-02-07 DIAGNOSIS — I517 Cardiomegaly: Secondary | ICD-10-CM | POA: Diagnosis not present

## 2023-02-07 DIAGNOSIS — Z743 Need for continuous supervision: Secondary | ICD-10-CM | POA: Diagnosis not present

## 2023-02-07 DIAGNOSIS — R609 Edema, unspecified: Secondary | ICD-10-CM | POA: Diagnosis not present

## 2023-02-07 DIAGNOSIS — Z7982 Long term (current) use of aspirin: Secondary | ICD-10-CM | POA: Insufficient documentation

## 2023-02-07 DIAGNOSIS — R0789 Other chest pain: Secondary | ICD-10-CM | POA: Insufficient documentation

## 2023-02-07 DIAGNOSIS — J449 Chronic obstructive pulmonary disease, unspecified: Secondary | ICD-10-CM | POA: Diagnosis not present

## 2023-02-07 DIAGNOSIS — I4891 Unspecified atrial fibrillation: Secondary | ICD-10-CM | POA: Insufficient documentation

## 2023-02-07 DIAGNOSIS — R918 Other nonspecific abnormal finding of lung field: Secondary | ICD-10-CM | POA: Diagnosis not present

## 2023-02-07 DIAGNOSIS — Z7901 Long term (current) use of anticoagulants: Secondary | ICD-10-CM | POA: Insufficient documentation

## 2023-02-07 DIAGNOSIS — R519 Headache, unspecified: Secondary | ICD-10-CM | POA: Diagnosis not present

## 2023-02-07 DIAGNOSIS — M7989 Other specified soft tissue disorders: Secondary | ICD-10-CM | POA: Insufficient documentation

## 2023-02-07 DIAGNOSIS — Z79899 Other long term (current) drug therapy: Secondary | ICD-10-CM | POA: Insufficient documentation

## 2023-02-07 DIAGNOSIS — Z794 Long term (current) use of insulin: Secondary | ICD-10-CM | POA: Diagnosis not present

## 2023-02-07 DIAGNOSIS — I110 Hypertensive heart disease with heart failure: Secondary | ICD-10-CM | POA: Diagnosis not present

## 2023-02-07 DIAGNOSIS — R6889 Other general symptoms and signs: Secondary | ICD-10-CM | POA: Diagnosis not present

## 2023-02-07 DIAGNOSIS — R079 Chest pain, unspecified: Secondary | ICD-10-CM | POA: Diagnosis not present

## 2023-02-07 DIAGNOSIS — I499 Cardiac arrhythmia, unspecified: Secondary | ICD-10-CM | POA: Diagnosis not present

## 2023-02-07 LAB — CBC
HCT: 45.8 % (ref 36.0–46.0)
Hemoglobin: 13.8 g/dL (ref 12.0–15.0)
MCH: 28.7 pg (ref 26.0–34.0)
MCHC: 30.1 g/dL (ref 30.0–36.0)
MCV: 95.2 fL (ref 80.0–100.0)
Platelets: 429 10*3/uL — ABNORMAL HIGH (ref 150–400)
RBC: 4.81 MIL/uL (ref 3.87–5.11)
RDW: 13.9 % (ref 11.5–15.5)
WBC: 6.1 10*3/uL (ref 4.0–10.5)
nRBC: 0 % (ref 0.0–0.2)

## 2023-02-07 LAB — BASIC METABOLIC PANEL
Anion gap: 15 (ref 5–15)
BUN: 12 mg/dL (ref 8–23)
CO2: 19 mmol/L — ABNORMAL LOW (ref 22–32)
Calcium: 9.2 mg/dL (ref 8.9–10.3)
Chloride: 105 mmol/L (ref 98–111)
Creatinine, Ser: 0.93 mg/dL (ref 0.44–1.00)
GFR, Estimated: >60 mL/min (ref >60)
Glucose, Bld: 96 mg/dL (ref 70–99)
Potassium: 3.5 mmol/L (ref 3.5–5.1)
Sodium: 139 mmol/L (ref 135–145)

## 2023-02-07 LAB — TROPONIN I (HIGH SENSITIVITY)
Troponin I (High Sensitivity): 15 ng/L (ref <18)
Troponin I (High Sensitivity): 7 ng/L (ref <18)

## 2023-02-07 MED ORDER — DOXYCYCLINE HYCLATE 100 MG PO CAPS: 100.0000 mg | ORAL_CAPSULE | Freq: Two times a day (BID) | ORAL | 0 refills | Status: AC

## 2023-02-07 NOTE — ED Triage Notes (Signed)
Pt to ED via [facility] from home. Pt c/o bilateral foot swelling x a few days. Pt began c/o intermittent chest tightness with SOB that began tonight. Pt has PRN O2 at home and put herself on home O2. Upon EMS arrival, EMS put pt on RA and pt was sating 92-93%. Pt took 162 ASA PTA. Pt reports taking medications properly at home as prescribed.   Pt has hx of CHF, a fib, COPD, DM, stroke  EMS: 82 HR 123 CBG 150/86 93% RA

## 2023-02-07 NOTE — ED Notes (Signed)
Pt refusing fall risk interventions.

## 2023-02-07 NOTE — ED Notes (Signed)
Patient transported to X-ray 

## 2023-02-07 NOTE — Discharge Instructions (Addendum)
I sent over an antibiotic: Doxycycline, please take one tablet by mouth twice daily for 7 days.  Please continue taking your other medications Please make an appointment with your PCP

## 2023-02-07 NOTE — ED Provider Notes (Signed)
[place] EMERGENCY DEPARTMENT AT [facility] Provider Note   CSN: [id number] Arrival date & time: 02/07/23  2043     History  Chief Complaint  Patient presents with   Chest Pain   Shortness of Breath    Sharon Vasquez is a 72 y.o. female past medical history of A-fib on Xarelto 20 mg daily, PE, HFrEF <20% 10/2022, COPD, , chronic respiratory failure with hypoxia with at home oxygen requirement, hypertension, morbid obesity presents to ED for shortness of breath and chest pain.  Patient reports that she was getting ready for bed this evening.  She started to take her pain medication, which then she stopped.  She started having chest pain, localized to substernal, no radiation with shortness of breath so she checked her heart rate it was in 130s and her oxygen was saturating at 88%.  She put on her home oxygen at 2 L, then she noted her oxygen was saturating at 90%.  Patient reports that the chest pain lasted for about 3 minutes.  Patient reports that her bilateral lower extremity has been swelling for the past 2 days.  Otherwise she denies any fevers, chills, worsening cough from her baseline, congestion, sore throat, nausea, vomiting, abdominal pain, dysuria.  Patient reports that she has been taking her Xarelto, Lasix and all of her other medications on time.   Chest Pain Associated symptoms: headache and shortness of breath   Associated symptoms: no abdominal pain, no cough, no dizziness, no fever, no nausea and no vomiting   Shortness of Breath Associated symptoms: chest pain and headaches   Associated symptoms: no abdominal pain, no cough, no fever, no sore throat and no vomiting       Home Medications Prior to Admission medications   Medication Sig Start Date End Date Taking? Authorizing Provider  albuterol (VENTOLIN HFA) 108 (90 Base) MCG/ACT inhaler Inhale 2 puffs into the lungs every 4 (four) hours as needed for shortness of breath. 07/10/22   Atway, Rayann N, DO   alendronate (FOSAMAX) 70 MG tablet Take 70 mg by mouth once a week. Take with a full glass of water on an empty stomach, Patient hasn't started medication.    [provider]  aspirin EC 81 MG tablet Take 81 mg by mouth daily. 12/15/20   [provider]  atorvastatin (LIPITOR) 80 MG tablet Take 1 tablet (80 mg total) by mouth daily. 10/09/22   Atway, Rayann N, DO  empagliflozin (JARDIANCE) 10 MG TABS tablet Take 1 tablet (10 mg total) by mouth daily before breakfast. 11/16/22   Clegg, Amy D, NP  esomeprazole (NEXIUM) 40 MG capsule Take 1 capsule (40 mg total) by mouth daily as needed (for heartburn or indigestion). Patient taking differently: Take 40 mg by mouth daily. 01/20/19   Synetta Fail, MD  Fluticasone-Umeclidin-Vilant (TRELEGY ELLIPTA) 100-62.5-25 MCG/ACT AEPB Inhale 1 puff into the lungs daily. 11/06/22   Nooruddin, Jason Fila, MD  gabapentin (NEURONTIN) 400 MG capsule Take 1 capsule (400 mg total) by mouth 2 (two) times daily. 11/16/22 11/16/23  Alexander-Savino, [place], MD  letrozole (FEMARA) 2.5 MG tablet TAKE 1 TABLET(2.5 MG) BY MOUTH DAILY Patient taking differently: Take 2.5 mg by mouth daily. TAKE 1 TABLET(2.5 MG) BY MOUTH DAILY 06/14/22   Serena Croissant, MD  LORazepam (ATIVAN) 0.5 MG tablet Take 0.5 mg by mouth daily as needed for anxiety.    [provider]  losartan (COZAAR) 25 MG tablet Take 1 tablet (25 mg total) by mouth daily.  Patient taking differently: Take 25 mg by mouth daily. Taking 12.5 mg if BP low 02/22/22 02/22/23  Modena Slater, DO  metoprolol succinate (TOPROL-XL) 25 MG 24 hr tablet Take 1 tablet (25 mg total) by mouth daily. 12/26/22   Atway, Derwood Kaplan, DO  naloxone (NARCAN) nasal spray 4 mg/0.1 mL Place 1 spray into the nose once as needed (for overdose suspected). for opioid overdose 11/02/21   [provider]  oxybutynin (DITROPAN) 5 MG tablet Take 1 tablet (5 mg total) by mouth 3 (three) times daily. 10/17/22   Marrianne Mood, MD   oxyCODONE (ROXICODONE) 15 MG immediate release tablet Take 15 mg by mouth See admin instructions. Take 15mg  (1 tablet) by mouth every morning and 15mg  (1 tablet) at bedtime in addition to twice during the day as needed. 08/05/20   [provider]  OXYGEN Inhale 2-3 L into the lungs daily.    [provider]  potassium chloride SA (KLOR-CON M) 20 MEQ tablet Take 2 tablets (40 mEq total) by mouth 2 (two) times daily. 12/14/22   Andrey Farmer, PA-C  PRESCRIPTION MEDICATION Inhale into the lungs at bedtime. cpap    [provider]  rivaroxaban (XARELTO) 20 MG TABS tablet Take 1 tablet (20 mg total) by mouth every morning. 02/13/22   Briscoe Burns, MD  Semaglutide-Weight Management (WEGOVY) 1 MG/0.5ML SOAJ Inject 1 mg into the skin once a week. 11/30/22   Atway, Rayann N, DO  spironolactone (ALDACTONE) 25 MG tablet Take 1 tablet (25 mg total) by mouth daily. 02/01/23 05/02/23  Andrey Farmer, PA-C  torsemide (DEMADEX) 20 MG tablet Take 3 tablets (60 mg total) by mouth daily. 02/01/23   Andrey Farmer, PA-C      Allergies    Patient has no known allergies.    Review of Systems   Review of Systems  Constitutional:  Negative for chills and fever.  HENT:  Negative for congestion and sore throat.   Respiratory:  Positive for shortness of breath. Negative for cough.   Cardiovascular:  Positive for chest pain and leg swelling.  Gastrointestinal:  Negative for abdominal pain, nausea and vomiting.  Genitourinary:  Negative for dysuria.  Neurological:  Positive for headaches. Negative for dizziness and light-headedness.    Physical Exam Updated Vital Signs BP 92/62   Pulse 84   Temp 98.3 F (36.8 C)   Resp (!) 9   SpO2 99%  Physical Exam  General: Obese, no acute distress, on 2.5 L oxygen via nasal cannula Cardiovascular: Regular rate. No pitting edema noted bilateral LE. NO JVD.  Pulmonary: +wheeze LLL, RUL and RML. Normal work of breathing ABD:  Soft, non tender, non distended, bowel sounds present MSK: chronic discoloration of the bilateral LE Skin: warm and dry  ED Results / Procedures / Treatments   Labs (all labs ordered are listed, but only abnormal results are displayed) Labs Reviewed  BASIC METABOLIC PANEL - Abnormal; Notable for the following components:      Result Value   CO2 19 (*)    All other components within normal limits  CBC - Abnormal; Notable for the following components:   Platelets 429 (*)    All other components within normal limits  TROPONIN I (HIGH SENSITIVITY)  TROPONIN I (HIGH SENSITIVITY)    EKG EKG Interpretation Date/Time:  Wednesday February 07 2023 20:55:11 EST Ventricular Rate:  86 PR Interval:    QRS Duration:  106 QT Interval:  378 QTC Calculation: 452 R Axis:   -  38  Text Interpretation: Atrial fibrillation Left axis deviation Incomplete right bundle branch block Septal infarct , age undetermined Abnormal ECG When compared with ECG of 01-Feb-2023 11:19, PREVIOUS ECG IS PRESENT Similar to previous Confirmed by Coralee Pesa [phone]) on 02/07/2023 9:20:33 PM  Radiology DG Chest 2 View  Result Date: 02/07/2023 CLINICAL DATA:  Chest pain EXAM: CHEST - 2 VIEW COMPARISON:  12/26/2022 FINDINGS: The lungs are symmetrically expanded. Asymmetric sparse pulmonary infiltrates have developed within the left mid and lower lung zone, possibly infectious in the acute setting. No pneumothorax or pleural effusion. Stable cardiomegaly. Pulmonary vascularity is normal. No acute bone abnormality. IMPRESSION: 1. Asymmetric sparse pulmonary infiltrates within the left mid and lower lung zone, possibly infectious in the acute setting. 2. Stable cardiomegaly. Electronically Signed   By: Helyn Numbers M.D.   On: 02/07/2023 22:33    Procedures None   Medications Ordered in ED Medications - No data to display  ED Course/ Medical Decision Making/ A&P :1}          HEART Score: 4                    Medical  Decision Making Amount and/or Complexity of Data Reviewed Labs: ordered. Radiology: ordered.   This patient presents to the ED for concern of SOB/CP, this involves an extensive number of treatment options, and is a complaint that carries with it a high risk of complications and morbidity. The differential diagnosis includes ACS, PE, COPD exacerbation, CHF exacerbation.    Co morbidities that complicate the patient evaluation  As per above   Additional history obtained:  Additional history obtained from per chart review  External records from outside source obtained and reviewed including None  Lab Tests:  I Ordered, and personally interpreted labs.  The pertinent results include: No electrolyte abnormalities, creatinine normal, no leukocytosis, hemoglobin normal, platelets 429, troponin 15, 7.  Imaging Studies ordered:  I ordered imaging studies including chest x-ray I independently visualized and interpreted imaging which showed mild opacity left lower lung I agree with the radiologist interpretation  IMPRESSION: 1. Asymmetric sparse pulmonary infiltrates within the left mid and lower lung zone, possibly infectious in the acute setting. 2. Stable cardiomegaly.  Cardiac Monitoring: / EKG:  The patient was maintained on a cardiac monitor.  I personally viewed and interpreted the cardiac monitored which showed an underlying rhythm of: Irregular rhythm  Consultations Obtained:  None  Problem List / ED Course / Critical interventions / Medication management  Shortness of breath, Chest pain unspecified  No medications ordered; she was placed on oxygen 2 L via nasal cannula Reevaluation of the patient after these medicines showed that the patient resolved I have reviewed the patients home medicines and have made adjustments as needed   Social Determinants of Health:  As above  Test / Admission - Considered:  This is 72 year old female past medical history of A-fib on  Xarelto 20 mg daily, PE, heart failure with reduced ejection fraction less than 20% on echo noted 10/2022, COPD, chronic respiratory failure with hypoxia with a home oxygen requirement, hypertension, morbid obesity presents to ED for shortness of breath and chest pain this evening.  She described as pressure-like pain substernal, no radiation.  She checked her heart rate it was in 130s, oxygen saturating at 88%.  Patient put on her home oxygen at 2 L which raised her oxygen saturation to 90%.  Otherwise her labs are reassuring, no electrolyte abnormalities, creatinine normal.  Troponin  15, 7 unlikely ACS.  Patient takes Xarelto 10 mg daily, low suspicion for PE. CXR shows sparse pulmonary infiltrates within the left mid and lower lung zone, thought patient is afebrile, no symptoms of worsening cough, sputum production, congestion or recent fevers/ sick contacts. Low suspicion for infectious however considering this chronically ill patient with multiple of co-morbidities and on oxygen requirement, low threshold to treat with a course of antibiotics in an outpatient setting with recommendation to follow up with PCP. Patient acknowledges the plan.    Final Clinical Impression(s) / ED Diagnoses Final diagnoses:  Atypical chest pain  Shortness of breath    Rx / DC Orders ED Discharge Orders     None         Maymuna Detzel, DO 02/07/23 [id number]    Horton, Clabe Seal, DO 02/07/23 [id number]

## 2023-02-08 NOTE — ED Notes (Signed)
Family at bedside. DC instructions reviewed

## 2023-02-08 NOTE — ED Notes (Signed)
Pt. Updated daughter regarding DC and ride home.

## 2023-02-11 NOTE — Telephone Encounter (Signed)
Okay to titrate O2 as needed with exertion to maintain sat of 92% with exercise.

## 2023-02-11 NOTE — Telephone Encounter (Signed)
-----   Message from Nurse Carlette C sent at 02/06/2023  1:48 PM EST ----- Regarding: Acceptable oxygen saturation for Cardiac Rehab   Thank you for the referral of the above pt to Cardiac rehab.  Noted in her history, she uses oxygen.  What is an acceptable Oxygen saturation specific for this pt for rest and on exertion? Ok, to titrate oxygen to maintain this saturation?  Thanks Pharmacist, hospital, BSN Cardiac and Emergency planning/management officer

## 2023-02-12 ENCOUNTER — Telehealth (HOSPITAL_COMMUNITY): Payer: Self-pay

## 2023-02-12 ENCOUNTER — Other Ambulatory Visit: Payer: Self-pay | Admitting: Adult Health

## 2023-02-12 ENCOUNTER — Encounter (HOSPITAL_COMMUNITY): Payer: Self-pay

## 2023-02-12 DIAGNOSIS — Z9889 Other specified postprocedural states: Secondary | ICD-10-CM

## 2023-02-12 NOTE — Telephone Encounter (Signed)
Called patient to see if She was interested in participating in the cardiac Rehab Program. Patient stated yes. Patient will come in for orientation on 11/12@1030  and will attend the 1230 exercise class.

## 2023-02-13 ENCOUNTER — Encounter (HOSPITAL_COMMUNITY)
Admission: RE | Admit: 2023-02-13 | Discharge: 2023-02-13 | Disposition: A | Discharge status: Home / Self Care | Payer: 59 | Source: Ambulatory Visit | Attending: Cardiology | Admitting: Cardiology

## 2023-02-13 VITALS — BP 110/70 | HR 99 | Ht 74.0 in | Wt 338.8 lb

## 2023-02-13 DIAGNOSIS — I5022 Chronic systolic (congestive) heart failure: Secondary | ICD-10-CM

## 2023-02-13 DIAGNOSIS — Z5189 Encounter for other specified aftercare: Secondary | ICD-10-CM | POA: Diagnosis not present

## 2023-02-13 NOTE — Progress Notes (Signed)
Cardiac Rehab Medication Review   Does the patient  feel that his/her medications are working for him/her?  yes  Has the patient been experiencing any side effects to the medications prescribed?  no  Does the patient measure his/her own blood pressure or blood glucose at home?  yes   Does the patient have any problems obtaining medications due to transportation or finances?   yes  Understanding of regimen: good Understanding of indications: good Potential of compliance: good    Comments: Pt checks blood pressure at home daily. Pt uses the [facility] near her home which she can access with her scooter. Sometimes that pharmacy does not have a medication in stock and pt voices she has to go searching for. Daughter helps her sometimes.     Lorin Picket 02/13/2023 10:42 AM

## 2023-02-13 NOTE — Progress Notes (Addendum)
Cardiac Individual Treatment Plan  Patient Details  Name: Sharon Vasquez [name]: [id number] Date of Birth: Apr 06, 1950 Referring Provider:   Flowsheet Row INTENSIVE CARDIAC REHAB ORIENT from 02/13/2023 in [facility]  Referring Provider [name], MD       Initial Encounter Date:  Flowsheet Row INTENSIVE CARDIAC REHAB ORIENT from 02/13/2023 in [facility]  Date 02/13/23       Visit Diagnosis: Heart failure, chronic systolic (HCC)  Patient's Home Medications on Admission:  Current Outpatient Medications:    albuterol (VENTOLIN HFA) 108 (90 Base) MCG/ACT inhaler, Inhale 2 puffs into the lungs every 4 (four) hours as needed for shortness of breath., Disp: 18 g, Rfl: 1   alendronate (FOSAMAX) 70 MG tablet, Take 70 mg by mouth once a week. Take with a full glass of water on an empty stomach, Patient hasn't started medication., Disp: , Rfl:    aspirin EC 81 MG tablet, Take 81 mg by mouth daily., Disp: , Rfl:    atorvastatin (LIPITOR) 80 MG tablet, Take 1 tablet (80 mg total) by mouth daily., Disp: 100 tablet, Rfl: 2   doxycycline (VIBRAMYCIN) 100 MG capsule, Take 1 capsule (100 mg total) by mouth 2 (two) times daily for 7 days., Disp: 14 capsule, Rfl: 0   empagliflozin (JARDIANCE) 10 MG TABS tablet, Take 1 tablet (10 mg total) by mouth daily before breakfast., Disp: 30 tablet, Rfl: 2   esomeprazole (NEXIUM) 40 MG capsule, Take 1 capsule (40 mg total) by mouth daily as needed (for heartburn or indigestion). (Patient taking differently: Take 40 mg by mouth daily.), Disp: 90 capsule, Rfl: 1   Fluticasone-Umeclidin-Vilant (TRELEGY ELLIPTA) 100-62.5-25 MCG/ACT AEPB, Inhale 1 puff into the lungs daily., Disp: 60 each, Rfl: 5   gabapentin (NEURONTIN) 400 MG capsule, Take 1 capsule (400 mg total) by mouth 2 (two) times daily., Disp: 60 capsule, Rfl: 2   letrozole (FEMARA) 2.5 MG  tablet, TAKE 1 TABLET(2.5 MG) BY MOUTH DAILY (Patient taking differently: Take 2.5 mg by mouth daily. TAKE 1 TABLET(2.5 MG) BY MOUTH DAILY), Disp: 90 tablet, Rfl: 3   LORazepam (ATIVAN) 0.5 MG tablet, Take 0.5 mg by mouth daily as needed for anxiety., Disp: , Rfl:    losartan (COZAAR) 25 MG tablet, Take 1 tablet (25 mg total) by mouth daily. (Patient taking differently: Take 25 mg by mouth daily. Taking 12.5 mg if BP low), Disp: 30 tablet, Rfl: 11   metoprolol succinate (TOPROL-XL) 25 MG 24 hr tablet, Take 1 tablet (25 mg total) by mouth daily., Disp: 100 tablet, Rfl: 2   naloxone (NARCAN) nasal spray 4 mg/0.1 mL, Place 1 spray into the nose once as needed (for overdose suspected). for opioid overdose, Disp: , Rfl:    oxybutynin (DITROPAN) 5 MG tablet, Take 1 tablet (5 mg total) by mouth 3 (three) times daily., Disp: , Rfl:    oxyCODONE (ROXICODONE) 15 MG immediate release tablet, Take 15 mg by mouth See admin instructions. Take 15mg  (1 tablet) by mouth every morning and 15mg  (1 tablet) at bedtime in addition to twice during the day as needed., Disp: , Rfl:    OXYGEN, Inhale 2-3 L into the lungs daily., Disp: , Rfl:    potassium chloride SA (KLOR-CON M) 20 MEQ tablet, Take 2 tablets (40 mEq total) by mouth 2 (two) times daily., Disp: 90 tablet, Rfl: 3   PRESCRIPTION MEDICATION, Inhale into the lungs  at bedtime. cpap, Disp: , Rfl:    rivaroxaban (XARELTO) 20 MG TABS tablet, Take 1 tablet (20 mg total) by mouth every morning., Disp: 90 tablet, Rfl: 3   Semaglutide-Weight Management (WEGOVY) 1 MG/0.5ML SOAJ, Inject 1 mg into the skin once a week., Disp: 2 mL, Rfl: 3   spironolactone (ALDACTONE) 25 MG tablet, Take 1 tablet (25 mg total) by mouth daily., Disp: 90 tablet, Rfl: 3   torsemide (DEMADEX) 20 MG tablet, Take 3 tablets (60 mg total) by mouth daily., Disp: 90 tablet, Rfl: 3  Past Medical History: Past Medical History:  Diagnosis Date   A-fib (HCC)    Acute hypoxemic respiratory failure (HCC)  02/20/2022   Acute on chronic hypoxic respiratory failure (HCC) 01/04/2022   Acute upper respiratory infection 01/08/2022   Anxiety    Arthritis    "qwhre; joints, back" (04/17/2017)   Atrial fibrillation (HCC)    Benign breast cyst in female, left 01/08/2017   Found by Screening mammogram, evaluated by [facility] on 01/08/17 and determined to be a benign simple breast cyst.   Breast cancer (HCC)    Cellulitis of left lower leg 05/30/2017   CHF (congestive heart failure) (HCC)    Chronic low back pain 08/21/2016   Chronic lower back pain    Chronic venous insufficiency    /notes 05/30/2017   COPD (chronic obstructive pulmonary disease) (HCC)    Depression    Diabetes (HCC) 01/08/2022   Diabetes mellitus without complication (HCC)    DVT (deep venous thrombosis) (HCC) 11/16/2016   Dysrhythmia    Esophageal dysmotility 11/10/2019   Previous workup by ENT with fiberoptic leryngoscopy which was normal. Dx with laryngeal pharyngeal reflux.   GERD (gastroesophageal reflux disease)    Headache    "weekly for the last 3 months" (04/17/2017)   History of pulmonary embolism    Pulmonary embolism   Hyperlipidemia    Hypertension    Laryngopharyngeal reflux 12/18/2015   Morbid obesity (HCC)    PE (pulmonary embolism)    Pulmonary embolism (HCC) 09/21/2014   Sleep apnea    Stroke (HCC) 12/02/2021    Tobacco Use: Social History   Tobacco Use  Smoking Status Former   Current packs/day: 0.00   Average packs/day: 2.0 packs/day for 60.4 years (120.8 ttl pk-yrs)   Types: Cigarettes   Start date: 08/16/1961   Quit date: 01/02/2022   Years since quitting: 1.1  Smokeless Tobacco Never  Tobacco Comments   Pt smokes about 4 ciggs daily. Stopped 01/02/2022     Labs: Review Flowsheet  More data exists      Latest Ref Rng & Units 04/27/2022 10/28/2022 10/29/2022 10/30/2022 11/01/2022  Labs for ITP Cardiac and Pulmonary Rehab  Trlycerides <150 mg/dL - - 664  - -  Hemoglobin A1c 4.8 - 5.6 % 5.6  - 5.8   - -  PH, Arterial 7.35 - 7.45 - 7.173  7.327  - 7.403  7.420   PCO2 arterial 32 - 48 mmHg - 63.2  40.5  - 50.8  46.2   Bicarbonate 20.0 - 28.0 mmol/L 20.0 - 28.0 mmol/L - 23.2  21.2  - 31.7  31.9  30.0   TCO2 22 - 32 mmol/L 22 - 32 mmol/L - 25  22  22   - 33  33  31   Acid-base deficit 0.0 - 2.0 mmol/L - 6.0  4.0  - -  O2 Saturation % % - 91  99  75.4  71  68  96  Details       Multiple values from one day are sorted in reverse-chronological order         Capillary Blood Glucose: Lab Results  Component Value Date   GLUCAP 200 (H) 12/02/2022   GLUCAP 102 (H) 11/06/2022   GLUCAP 107 (H) 11/06/2022   GLUCAP 124 (H) 11/06/2022   GLUCAP 115 (H) 11/05/2022     Exercise Target Goals: Exercise Program Goal: Individual exercise prescription set using results from initial 6 min walk test and THRR while considering  patient's activity barriers and safety.   Exercise Prescription Goal: Initial exercise prescription builds to 30-45 minutes a day of aerobic activity, 2-3 days per week.  Home exercise guidelines will be given to patient during program as part of exercise prescription that the participant will acknowledge.  Activity Barriers & Risk Stratification:  Activity Barriers & Cardiac Risk Stratification - 02/13/23 1320       Activity Barriers & Cardiac Risk Stratification   Activity Barriers Balance Concerns;Arthritis;Joint Problems;Back Problems;Deconditioning;Assistive Device;Muscular Weakness;Shortness of Breath;Neck/Spine Problems;Other (comment)    Comments Pt does not walk, uses mobility chair    Cardiac Risk Stratification High             6 Minute Walk:  6 Minute Walk     Row Name 02/13/23 1317         6 Minute Walk   Phase Initial  NUSTEP TEST     Distance 1017 feet  NUSTEP     Walk Time 6 minutes     # of Rest Breaks 0     MPH 1.93     METS 1     RPE 11     Perceived Dyspnea  0     VO2 Peak 3.2     Symptoms Yes (comment)     Comments  Bilateral knee pain 9/10     Resting HR 86 bpm     Resting BP 110/70     Resting Oxygen Saturation  93 %     Exercise Oxygen Saturation  during 6 min walk 100 %     Max Ex. HR 96 bpm     Max Ex. BP 100/60     2 Minute Post BP 104/70       Interval HR   1 Minute HR 93     2 Minute HR 93     3 Minute HR 85     4 Minute HR 86     5 Minute HR 84     6 Minute HR 94     Interval Heart Rate? Yes       Interval Oxygen   Interval Oxygen? Yes     Baseline Oxygen Saturation % 93 %     1 Minute Oxygen Saturation % 96 %     1 Minute Liters of Oxygen 2 L     2 Minute Oxygen Saturation % 96 %     2 Minute Liters of Oxygen 2 L     3 Minute Oxygen Saturation % 98 %     3 Minute Liters of Oxygen 2 L     4 Minute Oxygen Saturation % 98 %     4 Minute Liters of Oxygen 2 L     5 Minute Oxygen Saturation % 100 %     5 Minute Liters of Oxygen 2 L     6 Minute Oxygen Saturation % 100 %     6 Minute Liters of Oxygen 2  L              Oxygen Initial Assessment:   Oxygen Re-Evaluation:   Oxygen Discharge (Final Oxygen Re-Evaluation):   Initial Exercise Prescription:  Initial Exercise Prescription - 02/13/23 1300       Date of Initial Exercise RX and Referring Provider   Date 02/13/23    Referring Provider [name], MD    Expected Discharge Date 05/08/22      T5 Nustep   Level 1    SPM 60    Minutes 20    METs 1      Prescription Details   Frequency (times per week) 3    Duration Progress to 30 minutes of continuous aerobic without signs/symptoms of physical distress      Intensity   THRR 40-80% of Max Heartrate 59-118    Ratings of Perceived Exertion 11-13    Perceived Dyspnea 0-4      Progression   Progression Continue progressive overload as per policy without signs/symptoms or physical distress.      Resistance Training   Training Prescription Yes    Weight 2 lbs    Reps 10-15             Perform Capillary Blood Glucose checks as  needed.  Exercise Prescription Changes:   Exercise Comments:   Exercise Goals and Review:   Exercise Goals     Row Name 02/13/23 1321             Exercise Goals   Increase Physical Activity Yes       Intervention Provide advice, education, support and counseling about physical activity/exercise needs.;Develop an individualized exercise prescription for aerobic and resistive training based on initial evaluation findings, risk stratification, comorbidities and participant's personal goals.       Expected Outcomes Short Term: Attend rehab on a regular basis to increase amount of physical activity.;Long Term: Exercising regularly at least 3-5 days a week.;Long Term: Add in home exercise to make exercise part of routine and to increase amount of physical activity.       Increase Strength and Stamina Yes       Intervention Provide advice, education, support and counseling about physical activity/exercise needs.;Develop an individualized exercise prescription for aerobic and resistive training based on initial evaluation findings, risk stratification, comorbidities and participant's personal goals.       Expected Outcomes Short Term: Increase workloads from initial exercise prescription for resistance, speed, and METs.;Short Term: Perform resistance training exercises routinely during rehab and add in resistance training at home;Long Term: Improve cardiorespiratory fitness, muscular endurance and strength as measured by increased METs and functional capacity ( )       Able to understand and use rate of perceived exertion (RPE) scale Yes       Intervention Provide education and explanation on how to use RPE scale       Expected Outcomes Short Term: Able to use RPE daily in rehab to express subjective intensity level;Long Term:  Able to use RPE to guide intensity level when exercising independently       Knowledge and understanding of Target Heart Rate Range (THRR) Yes       Intervention  Provide education and explanation of THRR including how the numbers were predicted and where they are located for reference       Expected Outcomes Short Term: Able to state/look up THRR;Long Term: Able to use THRR to govern intensity when exercising independently;Short Term: Able to use daily as  guideline for intensity in rehab       Understanding of Exercise Prescription Yes       Intervention Provide education, explanation, and written materials on patient's individual exercise prescription       Expected Outcomes Short Term: Able to explain program exercise prescription;Long Term: Able to explain home exercise prescription to exercise independently                Exercise Goals Re-Evaluation :   Discharge Exercise Prescription (Final Exercise Prescription Changes):   Nutrition:  Target Goals: Understanding of nutrition guidelines, daily intake of sodium 1500mg , cholesterol 200mg , calories 30% from fat and 7% or less from saturated fats, daily to have 5 or more servings of fruits and vegetables.  Biometrics:   Post Biometrics - 02/13/23 1110        Post  Biometrics   Waist Circumference --   Pt cannot stand to measure   Hip Circumference --   Pt cannot stand to measure   Triceps Skinfold 45 mm    % Body Fat --   unable to calculate   Grip Strength 28 kg    Flexibility --   Not perfomed due to mobility   Single Leg Stand --   Pt cannot stand unassisted            Nutrition Therapy Plan and Nutrition Goals:   Nutrition Assessments:  MEDIFICTS Score Key: >=70 Need to make dietary changes  40-70 Heart Healthy Diet <= 40 Therapeutic Level Cholesterol Diet    Picture Your Plate Scores: <47 Unhealthy dietary pattern with much room for improvement. 41-50 Dietary pattern unlikely to meet recommendations for good health and room for improvement. 51-60 More healthful dietary pattern, with some room for improvement.  >60 Healthy dietary pattern, although there may be  some specific behaviors that could be improved.    Nutrition Goals Re-Evaluation:   Nutrition Goals Re-Evaluation:   Nutrition Goals Discharge (Final Nutrition Goals Re-Evaluation):   Psychosocial: Target Goals: Acknowledge presence or absence of significant depression and/or stress, maximize coping skills, provide positive support system. Participant is able to verbalize types and ability to use techniques and skills needed for reducing stress and depression.  Initial Review & Psychosocial Screening:  Initial Psych Review & Screening - 02/13/23 1231       Initial Review   Current issues with --   Pt has Ativan for anxiety PRN. Pt voices that she is not depressed, more situational     Family Dynamics   Good Support System? Yes   Pt has her daughters for support     Barriers   Psychosocial barriers to participate in program There are no identifiable barriers or psychosocial needs.      Screening Interventions   Interventions Encouraged to exercise             Quality of Life Scores:  Quality of Life - 02/13/23 1309       Quality of Life   Select Quality of Life      Quality of Life Scores   Health/Function Pre 18 %    Socioeconomic Pre 23 %    Psych/Spiritual Pre 28.29 %    Family Pre 10 %    GLOBAL Pre 20.52 %            Scores of 19 and below usually indicate a poorer quality of life in these areas.  A difference of  2-3 points is a clinically meaningful difference.  A difference of  2-3 points in the total score of the Quality of Life Index has been associated with significant improvement in overall quality of life, self-image, physical symptoms, and general health in studies assessing change in quality of life.  PHQ-9: Review Flowsheet  More data exists      02/13/2023 12/13/2022 11/29/2022 10/25/2022 10/17/2022  Depression screen PHQ 2/9  Decreased Interest 0 0 1 0 0  Down, Depressed, Hopeless 1 0 1 1 0  PHQ - 2 Score 1 0 2 1 0  Altered sleeping 0 1 1  1  0  Tired, decreased energy 3 1 1 2 3   Change in appetite 0 1 1 0 0  Feeling bad or failure about yourself  0 - 0 1 0  Trouble concentrating 0 0 0 0 0  Moving slowly or fidgety/restless 0 0 0 0 0  Suicidal thoughts 0 0 0 0 0  PHQ-9 Score 4 3 5 5 3   Difficult doing work/chores Not difficult at all Not difficult at all Very difficult - Somewhat difficult    Details           Interpretation of Total Score  Total Score Depression Severity:  1-4 = Minimal depression, 5-9 = Mild depression, 10-14 = Moderate depression, 15-19 = Moderately severe depression, 20-27 = Severe depression   Psychosocial Evaluation and Intervention:   Psychosocial Re-Evaluation:   Psychosocial Discharge (Final Psychosocial Re-Evaluation):   Vocational Rehabilitation: Provide vocational rehab assistance to qualifying candidates.   Vocational Rehab Evaluation & Intervention:  Vocational Rehab - 02/13/23 1328       Initial Vocational Rehab Evaluation & Intervention   Assessment shows need for Vocational Rehabilitation No   Pt is retired            Education: Education Goals: Education classes will be provided on a weekly basis, covering required topics. Participant will state understanding/return demonstration of topics presented.     Core Videos: Exercise    Move It!  Clinical staff conducted group or individual video education with verbal and written material and guidebook.  Patient learns the recommended Pritikin exercise program. Exercise with the goal of living a long, healthy life. Some of the health benefits of exercise include controlled diabetes, healthier blood pressure levels, improved cholesterol levels, improved heart and lung capacity, improved sleep, and better body composition. Everyone should speak with their doctor before starting or changing an exercise routine.  Biomechanical Limitations Clinical staff conducted group or individual video education with verbal and written  material and guidebook.  Patient learns how biomechanical limitations can impact exercise and how we can mitigate and possibly overcome limitations to have an impactful and balanced exercise routine.  Body Composition Clinical staff conducted group or individual video education with verbal and written material and guidebook.  Patient learns that body composition (ratio of muscle mass to fat mass) is a key component to assessing overall fitness, rather than body weight alone. Increased fat mass, especially visceral belly fat, can put [place] at increased risk for metabolic syndrome, type 2 diabetes, heart disease, and even death. It is recommended to combine diet and exercise (cardiovascular and resistance training) to improve your body composition. Seek guidance from your physician and exercise physiologist before implementing an exercise routine.  Exercise Action Plan Clinical staff conducted group or individual video education with verbal and written material and guidebook.  Patient learns the recommended strategies to achieve and enjoy long-term exercise adherence, including variety, self-motivation, self-efficacy, and positive decision making. Benefits of exercise include fitness, good  health, weight management, more energy, better sleep, less stress, and overall well-being.  Medical   Heart Disease Risk Reduction Clinical staff conducted group or individual video education with verbal and written material and guidebook.  Patient learns our heart is our most vital organ as it circulates oxygen, nutrients, white blood cells, and hormones throughout the entire body, and carries waste away. Data supports a plant-based eating plan like the Pritikin Program for its effectiveness in slowing progression of and reversing heart disease. The video provides a number of recommendations to address heart disease.   Metabolic Syndrome and Belly Fat  Clinical staff conducted group or individual video education with  verbal and written material and guidebook.  Patient learns what metabolic syndrome is, how it leads to heart disease, and how one can reverse it and keep it from coming back. You have metabolic syndrome if you have 3 of the following 5 criteria: abdominal obesity, high blood pressure, high triglycerides, low HDL cholesterol, and high blood sugar.  Hypertension and Heart Disease Clinical staff conducted group or individual video education with verbal and written material and guidebook.  Patient learns that high blood pressure, or hypertension, is very common in the [place]. Hypertension is largely due to excessive salt intake, but other important risk factors include being overweight, physical inactivity, drinking too much alcohol, smoking, and not eating enough potassium from fruits and vegetables. High blood pressure is a leading risk factor for heart attack, stroke, congestive heart failure, dementia, kidney failure, and premature death. Long-term effects of excessive salt intake include stiffening of the arteries and thickening of heart muscle and organ damage. Recommendations include ways to reduce hypertension and the risk of heart disease.  Diseases of Our Time - Focusing on Diabetes Clinical staff conducted group or individual video education with verbal and written material and guidebook.  Patient learns why the best way to stop diseases of our time is prevention, through food and other lifestyle changes. Medicine (such as prescription pills and surgeries) is often only a Band-Aid on the problem, not a long-term solution. Most common diseases of our time include obesity, type 2 diabetes, hypertension, heart disease, and cancer. The Pritikin Program is recommended and has been proven to help reduce, reverse, and/or prevent the damaging effects of metabolic syndrome.  Nutrition   Overview of the Pritikin Eating Plan  Clinical staff conducted group or individual video education with verbal  and written material and guidebook.  Patient learns about the Pritikin Eating Plan for disease risk reduction. The Pritikin Eating Plan emphasizes a wide variety of unrefined, minimally-processed carbohydrates, like fruits, vegetables, whole grains, and legumes. Go, Caution, and Stop food choices are explained. Plant-based and lean animal proteins are emphasized. Rationale provided for low sodium intake for blood pressure control, low added sugars for blood sugar stabilization, and low added fats and oils for coronary artery disease risk reduction and weight management.  Calorie Density  Clinical staff conducted group or individual video education with verbal and written material and guidebook.  Patient learns about calorie density and how it impacts the Pritikin Eating Plan. Knowing the characteristics of the food you choose will help you decide whether those foods will lead to weight gain or weight loss, and whether you want to consume more or less of them. Weight loss is usually a side effect of the Pritikin Eating Plan because of its focus on low calorie-dense foods.  Label Reading  Clinical staff conducted group or individual video education with verbal and written  material and guidebook.  Patient learns about the Pritikin recommended label reading guidelines and corresponding recommendations regarding calorie density, added sugars, sodium content, and whole grains.  Dining Out - Part 1  Clinical staff conducted group or individual video education with verbal and written material and guidebook.  Patient learns that restaurant meals can be sabotaging because they can be so high in calories, fat, sodium, and/or sugar. Patient learns recommended strategies on how to positively address this and avoid unhealthy pitfalls.  Facts on Fats  Clinical staff conducted group or individual video education with verbal and written material and guidebook.  Patient learns that lifestyle modifications can be just  as effective, if not more so, as many medications for lowering your risk of heart disease. A Pritikin lifestyle can help to reduce your risk of inflammation and atherosclerosis (cholesterol build-up, or plaque, in the artery walls). Lifestyle interventions such as dietary choices and physical activity address the cause of atherosclerosis. A review of the types of fats and their impact on blood cholesterol levels, along with dietary recommendations to reduce fat intake is also included.  Nutrition Action Plan  Clinical staff conducted group or individual video education with verbal and written material and guidebook.  Patient learns how to incorporate Pritikin recommendations into their lifestyle. Recommendations include planning and keeping personal health goals in mind as an important part of their success.  Healthy Mind-Set    Healthy Minds, Bodies, Hearts  Clinical staff conducted group or individual video education with verbal and written material and guidebook.  Patient learns how to identify when they are stressed. Video will discuss the impact of that stress, as well as the many benefits of stress management. Patient will also be introduced to stress management techniques. The way we think, act, and feel has an impact on our hearts.  How Our Thoughts Can Heal Our Hearts  Clinical staff conducted group or individual video education with verbal and written material and guidebook.  Patient learns that negative thoughts can cause depression and anxiety. This can result in negative lifestyle behavior and serious health problems. Cognitive behavioral therapy is an effective method to help control our thoughts in order to change and improve our emotional outlook.  Additional Videos:  Exercise    Improving Performance  Clinical staff conducted group or individual video education with verbal and written material and guidebook.  Patient learns to use a non-linear approach by alternating intensity  levels and lengths of time spent exercising to help burn more calories and lose more body fat. Cardiovascular exercise helps improve heart health, metabolism, hormonal balance, blood sugar control, and recovery from fatigue. Resistance training improves strength, endurance, balance, coordination, reaction time, metabolism, and muscle mass. Flexibility exercise improves circulation, posture, and balance. Seek guidance from your physician and exercise physiologist before implementing an exercise routine and learn your capabilities and proper form for all exercise.  Introduction to Yoga  Clinical staff conducted group or individual video education with verbal and written material and guidebook.  Patient learns about yoga, a discipline of the coming together of mind, breath, and body. The benefits of yoga include improved flexibility, improved range of motion, better posture and core strength, increased lung function, weight loss, and positive self-image. Yoga's heart health benefits include lowered blood pressure, healthier heart rate, decreased cholesterol and triglyceride levels, improved immune function, and reduced stress. Seek guidance from your physician and exercise physiologist before implementing an exercise routine and learn your capabilities and proper form for all exercise.  Medical  Aging: Manufacturing systems engineer of Life  Clinical staff conducted group or individual video education with verbal and written material and guidebook.  Patient learns key strategies and recommendations to stay in good physical health and enhance quality of life, such as prevention strategies, having an advocate, securing a Health Care Proxy and Power of Attorney, and keeping a list of medications and system for tracking them. It also discusses how to avoid risk for bone loss.  Biology of Weight Control  Clinical staff conducted group or individual video education with verbal and written material and guidebook.   Patient learns that weight gain occurs because we consume more calories than we burn (eating more, moving less). Even if your body weight is normal, you may have higher ratios of fat compared to muscle mass. Too much body fat puts you at increased risk for cardiovascular disease, heart attack, stroke, type 2 diabetes, and obesity-related cancers. In addition to exercise, following the Pritikin Eating Plan can help reduce your risk.  Decoding Lab Results  Clinical staff conducted group or individual video education with verbal and written material and guidebook.  Patient learns that lab test reflects one measurement whose values change over time and are influenced by many factors, including medication, stress, sleep, exercise, food, hydration, pre-existing medical conditions, and more. It is recommended to use the knowledge from this video to become more involved with your lab results and evaluate your numbers to speak with your doctor.   Diseases of Our Time - Overview  Clinical staff conducted group or individual video education with verbal and written material and guidebook.  Patient learns that according to the CDC, 50% to 70% of chronic diseases (such as obesity, type 2 diabetes, elevated lipids, hypertension, and heart disease) are avoidable through lifestyle improvements including healthier food choices, listening to satiety cues, and increased physical activity.  Sleep Disorders Clinical staff conducted group or individual video education with verbal and written material and guidebook.  Patient learns how good quality and duration of sleep are important to overall health and well-being. Patient also learns about sleep disorders and how they impact health along with recommendations to address them, including discussing with a physician.  Nutrition  Dining Out - Part 2 Clinical staff conducted group or individual video education with verbal and written material and guidebook.  Patient learns  how to plan ahead and communicate in order to maximize their dining experience in a healthy and nutritious manner. Included are recommended food choices based on the type of restaurant the patient is visiting.   Fueling a Banker conducted group or individual video education with verbal and written material and guidebook.  There is a strong connection between our food choices and our health. Diseases like obesity and type 2 diabetes are very prevalent and are in large-part due to lifestyle choices. The Pritikin Eating Plan provides plenty of food and hunger-curbing satisfaction. It is easy to follow, affordable, and helps reduce health risks.  Menu Workshop  Clinical staff conducted group or individual video education with verbal and written material and guidebook.  Patient learns that restaurant meals can sabotage health goals because they are often packed with calories, fat, sodium, and sugar. Recommendations include strategies to plan ahead and to communicate with the manager, chef, or server to help order a healthier meal.  Planning Your Eating Strategy  Clinical staff conducted group or individual video education with verbal and written material and guidebook.  Patient learns about the Pritikin Eating Plan  and its benefit of reducing the risk of disease. The Pritikin Eating Plan does not focus on calories. Instead, it emphasizes high-quality, nutrient-rich foods. By knowing the characteristics of the foods, we choose, we can determine their calorie density and make informed decisions.  Targeting Your Nutrition Priorities  Clinical staff conducted group or individual video education with verbal and written material and guidebook.  Patient learns that lifestyle habits have a tremendous impact on disease risk and progression. This video provides eating and physical activity recommendations based on your personal health goals, such as reducing LDL cholesterol, losing weight,  preventing or controlling type 2 diabetes, and reducing high blood pressure.  Vitamins and Minerals  Clinical staff conducted group or individual video education with verbal and written material and guidebook.  Patient learns different ways to obtain key vitamins and minerals, including through a recommended healthy diet. It is important to discuss all supplements you take with your doctor.   Healthy Mind-Set    Smoking Cessation  Clinical staff conducted group or individual video education with verbal and written material and guidebook.  Patient learns that cigarette smoking and tobacco addiction pose a serious health risk which affects millions of people. Stopping smoking will significantly reduce the risk of heart disease, lung disease, and many forms of cancer. Recommended strategies for quitting are covered, including working with your doctor to develop a successful plan.  Culinary   Becoming a Set designer conducted group or individual video education with verbal and written material and guidebook.  Patient learns that cooking at home can be healthy, cost-effective, quick, and puts them in control. Keys to cooking healthy recipes will include looking at your recipe, assessing your equipment needs, planning ahead, making it simple, choosing cost-effective seasonal ingredients, and limiting the use of added fats, salts, and sugars.  Cooking - Breakfast and Snacks  Clinical staff conducted group or individual video education with verbal and written material and guidebook.  Patient learns how important breakfast is to satiety and nutrition through the entire day. Recommendations include key foods to eat during breakfast to help stabilize blood sugar levels and to prevent overeating at meals later in the day. Planning ahead is also a key component.  Cooking - Educational psychologist conducted group or individual video education with verbal and written material and  guidebook.  Patient learns eating strategies to improve overall health, including an approach to cook more at home. Recommendations include thinking of animal protein as a side on your plate rather than center stage and focusing instead on lower calorie dense options like vegetables, fruits, whole grains, and plant-based proteins, such as beans. Making sauces in large quantities to freeze for later and leaving the skin on your vegetables are also recommended to maximize your experience.  Cooking - Healthy Salads and Dressing Clinical staff conducted group or individual video education with verbal and written material and guidebook.  Patient learns that vegetables, fruits, whole grains, and legumes are the foundations of the Pritikin Eating Plan. Recommendations include how to incorporate each of these in flavorful and healthy salads, and how to create homemade salad dressings. Proper handling of ingredients is also covered. Cooking - Soups and [employer] - Soups and Desserts Clinical staff conducted group or individual video education with verbal and written material and guidebook.  Patient learns that Pritikin soups and desserts make for easy, nutritious, and delicious snacks and meal components that are low in sodium, fat, sugar, and calorie density,  while high in vitamins, minerals, and filling fiber. Recommendations include simple and healthy ideas for soups and desserts.   Overview     The Pritikin Solution Program Overview Clinical staff conducted group or individual video education with verbal and written material and guidebook.  Patient learns that the results of the Pritikin Program have been documented in more than 100 articles published in peer-reviewed journals, and the benefits include reducing risk factors for (and, in some cases, even reversing) high cholesterol, high blood pressure, type 2 diabetes, obesity, and more! An overview of the three key pillars of the Pritikin Program  will be covered: eating well, doing regular exercise, and having a healthy mind-set.  WORKSHOPS  Exercise: Exercise Basics: Building Your Action Plan Clinical staff led group instruction and group discussion with PowerPoint presentation and patient guidebook. To enhance the learning environment the use of posters, models and videos may be added. At the conclusion of this workshop, patients will comprehend the difference between physical activity and exercise, as well as the benefits of incorporating both, into their routine. Patients will understand the FITT (Frequency, Intensity, Time, and Type) principle and how to use it to build an exercise action plan. In addition, safety concerns and other considerations for exercise and cardiac rehab will be addressed by the presenter. The purpose of this lesson is to promote a comprehensive and effective weekly exercise routine in order to improve patients' overall level of fitness.   Managing Heart Disease: Your Path to a Healthier Heart Clinical staff led group instruction and group discussion with PowerPoint presentation and patient guidebook. To enhance the learning environment the use of posters, models and videos may be added.At the conclusion of this workshop, patients will understand the anatomy and physiology of the heart. Additionally, they will understand how Pritikin's three pillars impact the risk factors, the progression, and the management of heart disease.  The purpose of this lesson is to provide a high-level overview of the heart, heart disease, and how the Pritikin lifestyle positively impacts risk factors.  Exercise Biomechanics Clinical staff led group instruction and group discussion with PowerPoint presentation and patient guidebook. To enhance the learning environment the use of posters, models and videos may be added. Patients will learn how the structural parts of their bodies function and how these functions impact their daily  activities, movement, and exercise. Patients will learn how to promote a neutral spine, learn how to manage pain, and identify ways to improve their physical movement in order to promote healthy living. The purpose of this lesson is to expose patients to common physical limitations that impact physical activity. Participants will learn practical ways to adapt and manage aches and pains, and to minimize their effect on regular exercise. Patients will learn how to maintain good posture while sitting, walking, and lifting.  Balance Training and Fall Prevention  Clinical staff led group instruction and group discussion with PowerPoint presentation and patient guidebook. To enhance the learning environment the use of posters, models and videos may be added. At the conclusion of this workshop, patients will understand the importance of their sensorimotor skills (vision, proprioception, and the vestibular system) in maintaining their ability to balance as they age. Patients will apply a variety of balancing exercises that are appropriate for their current level of function. Patients will understand the common causes for poor balance, possible solutions to these problems, and ways to modify their physical environment in order to minimize their fall risk. The purpose of this lesson is to teach  patients about the importance of maintaining balance as they age and ways to minimize their risk of falling.  WORKSHOPS   Nutrition:  Fueling a Ship broker led group instruction and group discussion with PowerPoint presentation and patient guidebook. To enhance the learning environment the use of posters, models and videos may be added. Patients will review the foundational principles of the Pritikin Eating Plan and understand what constitutes a serving size in each of the food groups. Patients will also learn Pritikin-friendly foods that are better choices when away from home and review make-ahead  meal and snack options. Calorie density will be reviewed and applied to three nutrition priorities: weight maintenance, weight loss, and weight gain. The purpose of this lesson is to reinforce (in a group setting) the key concepts around what patients are recommended to eat and how to apply these guidelines when away from home by planning and selecting Pritikin-friendly options. Patients will understand how calorie density may be adjusted for different weight management goals.  Mindful Eating  Clinical staff led group instruction and group discussion with PowerPoint presentation and patient guidebook. To enhance the learning environment the use of posters, models and videos may be added. Patients will briefly review the concepts of the Pritikin Eating Plan and the importance of low-calorie dense foods. The concept of mindful eating will be introduced as well as the importance of paying attention to internal hunger signals. Triggers for non-hunger eating and techniques for dealing with triggers will be explored. The purpose of this lesson is to provide patients with the opportunity to review the basic principles of the Pritikin Eating Plan, discuss the value of eating mindfully and how to measure internal cues of hunger and fullness using the Hunger Scale. Patients will also discuss reasons for non-hunger eating and learn strategies to use for controlling emotional eating.  Targeting Your Nutrition Priorities Clinical staff led group instruction and group discussion with PowerPoint presentation and patient guidebook. To enhance the learning environment the use of posters, models and videos may be added. Patients will learn how to determine their genetic susceptibility to disease by reviewing their family history. Patients will gain insight into the importance of diet as part of an overall healthy lifestyle in mitigating the impact of genetics and other environmental insults. The purpose of this lesson is to  provide patients with the opportunity to assess their personal nutrition priorities by looking at their family history, their own health history and current risk factors. Patients will also be able to discuss ways of prioritizing and modifying the Pritikin Eating Plan for their highest risk areas  Menu  Clinical staff led group instruction and group discussion with PowerPoint presentation and patient guidebook. To enhance the learning environment the use of posters, models and videos may be added. Using menus brought in from [employer], or printed from [employer], patients will apply the Pritikin dining out guidelines that were presented in the [employer] video. Patients will also be able to practice these guidelines in a variety of provided scenarios. The purpose of this lesson is to provide patients with the opportunity to practice hands-on learning of the Pritikin Dining Out guidelines with actual menus and practice scenarios.  Label Reading Clinical staff led group instruction and group discussion with PowerPoint presentation and patient guidebook. To enhance the learning environment the use of posters, models and videos may be added. Patients will review and discuss the Pritikin label reading guidelines presented in Pritikin's Label Reading Educational series  video. Using fool labels brought in from local grocery stores and markets, patients will apply the label reading guidelines and determine if the packaged food meet the Pritikin guidelines. The purpose of this lesson is to provide patients with the opportunity to review, discuss, and practice hands-on learning of the Pritikin Label Reading guidelines with actual packaged food labels. Cooking School  Pritikin's [employer] are designed to teach patients ways to prepare quick, simple, and affordable recipes at home. The importance of nutrition's role in chronic disease risk reduction is reflected in its  emphasis in the overall Pritikin program. By learning how to prepare essential core Pritikin Eating Plan recipes, patients will increase control over what they eat; be able to customize the flavor of foods without the use of added salt, sugar, or fat; and improve the quality of the food they consume. By learning a set of core recipes which are easily assembled, quickly prepared, and affordable, patients are more likely to prepare more healthy foods at home. These workshops focus on convenient breakfasts, simple entres, side dishes, and desserts which can be prepared with minimal effort and are consistent with nutrition recommendations for cardiovascular risk reduction. Cooking [employer] are taught by a Armed forces logistics/support/administrative officer (RD) who has been trained by the [employer]. The chef or RD has a clear understanding of the importance of minimizing - if not completely eliminating - added fat, sugar, and sodium in recipes. Throughout the series of Cooking School Workshop sessions, patients will learn about healthy ingredients and efficient methods of cooking to build confidence in their capability to prepare    Cooking School weekly topics:  Adding Flavor- Sodium-Free  Fast and Healthy Breakfasts  Powerhouse Plant-Based Proteins  Satisfying Salads and Dressings  Simple Sides and Sauces  International Cuisine-Spotlight on the [employer] Zones  Delicious Desserts  Savory Soups  [employer] - Meals in a Astronomer Appetizers and Snacks  Comforting Weekend Breakfasts  One-Pot Wonders   Fast Evening Meals  Landscape architect Your Pritikin Plate  WORKSHOPS   Healthy Mindset (Psychosocial):  Focused Goals, Sustainable Changes Clinical staff led group instruction and group discussion with PowerPoint presentation and patient guidebook. To enhance the learning environment the use of posters, models and videos may be added. Patients will be able to apply effective  goal setting strategies to establish at least one personal goal, and then take consistent, meaningful action toward that goal. They will learn to identify common barriers to achieving personal goals and develop strategies to overcome them. Patients will also gain an understanding of how our mind-set can impact our ability to achieve goals and the importance of cultivating a positive and growth-oriented mind-set. The purpose of this lesson is to provide patients with a deeper understanding of how to set and achieve personal goals, as well as the tools and strategies needed to overcome common obstacles which may arise along the way.  From Head to Heart: The Power of a Healthy Outlook  Clinical staff led group instruction and group discussion with PowerPoint presentation and patient guidebook. To enhance the learning environment the use of posters, models and videos may be added. Patients will be able to recognize and describe the impact of emotions and mood on physical health. They will discover the importance of self-care and explore self-care practices which may work for them. Patients will also learn how to utilize the 4 C's to cultivate a healthier outlook and better manage stress and challenges. The  purpose of this lesson is to demonstrate to patients how a healthy outlook is an essential part of maintaining good health, especially as they continue their cardiac rehab journey.  Healthy Sleep for a Healthy Heart Clinical staff led group instruction and group discussion with PowerPoint presentation and patient guidebook. To enhance the learning environment the use of posters, models and videos may be added. At the conclusion of this workshop, patients will be able to demonstrate knowledge of the importance of sleep to overall health, well-being, and quality of life. They will understand the symptoms of, and treatments for, common sleep disorders. Patients will also be able to identify daytime and nighttime  behaviors which impact sleep, and they will be able to apply these tools to help manage sleep-related challenges. The purpose of this lesson is to provide patients with a general overview of sleep and outline the importance of quality sleep. Patients will learn about a few of the most common sleep disorders. Patients will also be introduced to the concept of "sleep hygiene," and discover ways to self-manage certain sleeping problems through simple daily behavior changes. Finally, the workshop will motivate patients by clarifying the links between quality sleep and their goals of heart-healthy living.   Recognizing and Reducing Stress Clinical staff led group instruction and group discussion with PowerPoint presentation and patient guidebook. To enhance the learning environment the use of posters, models and videos may be added. At the conclusion of this workshop, patients will be able to understand the types of stress reactions, differentiate between acute and chronic stress, and recognize the impact that chronic stress has on their health. They will also be able to apply different coping mechanisms, such as reframing negative self-talk. Patients will have the opportunity to practice a variety of stress management techniques, such as deep abdominal breathing, progressive muscle relaxation, and/or guided imagery.  The purpose of this lesson is to educate patients on the role of stress in their lives and to provide healthy techniques for coping with it.  Learning Barriers/Preferences:  Learning Barriers/Preferences - 02/13/23 1328       Learning Barriers/Preferences   Learning Barriers Sight    Learning Preferences Video;Skilled Demonstration;Individual Instruction;Group Instruction;Computer/Internet;Pictoral             Education Topics:  Knowledge Questionnaire Score:  Knowledge Questionnaire Score - 02/13/23 1331       Knowledge Questionnaire Score   Pre Score 21/24              Core Components/Risk Factors/Patient Goals at Admission:  Personal Goals and Risk Factors at Admission - 02/13/23 1328       Core Components/Risk Factors/Patient Goals on Admission    Weight Management Yes;Obesity;Weight Loss    Intervention Weight Management: Develop a combined nutrition and exercise program designed to reach desired caloric intake, while maintaining appropriate intake of nutrient and fiber, sodium and fats, and appropriate energy expenditure required for the weight goal.;Weight Management: Provide education and appropriate resources to help participant work on and attain dietary goals.;Weight Management/Obesity: Establish reasonable short term and long term weight goals.;Obesity: Provide education and appropriate resources to help participant work on and attain dietary goals.    Admit Weight 338 lb 13.6 oz (153.7 kg)    Expected Outcomes Short Term: Continue to assess and modify interventions until short term weight is achieved;Long Term: Adherence to nutrition and physical activity/exercise program aimed toward attainment of established weight goal;Weight Loss: Understanding of general recommendations for a balanced deficit meal plan, which promotes 1-2  lb weight loss per week and includes a negative energy balance of [phone] kcal/d;Understanding recommendations for meals to include 15-35% energy as protein, 25-35% energy from fat, 35-60% energy from carbohydrates, less than 200mg  of dietary cholesterol, 20-35 gm of total fiber daily;Understanding of distribution of calorie intake throughout the day with the consumption of 4-5 meals/snacks    Improve shortness of breath with ADL's Yes    Intervention Provide education, individualized exercise plan and daily activity instruction to help decrease symptoms of SOB with activities of daily living.    Expected Outcomes Short Term: Improve cardiorespiratory fitness to achieve a reduction of symptoms when performing ADLs    Heart  Failure Yes    Intervention Provide a combined exercise and nutrition program that is supplemented with education, support and counseling about heart failure. Directed toward relieving symptoms such as shortness of breath, decreased exercise tolerance, and extremity edema.    Expected Outcomes Improve functional capacity of life;Short term: Attendance in program 2-3 days a week with increased exercise capacity. Reported lower sodium intake. Reported increased fruit and vegetable intake. Reports medication compliance.;Short term: Daily weights obtained and reported for increase. Utilizing diuretic protocols set by physician.;Long term: Adoption of self-care skills and reduction of barriers for early signs and symptoms recognition and intervention leading to self-care maintenance.    Hypertension Yes    Intervention Provide education on lifestyle modifcations including regular physical activity/exercise, weight management, moderate sodium restriction and increased consumption of fresh fruit, vegetables, and low fat dairy, alcohol moderation, and smoking cessation.;Monitor prescription use compliance.    Expected Outcomes Short Term: Continued assessment and intervention until BP is < 140/97mm HG in hypertensive participants. < 130/45mm HG in hypertensive participants with diabetes, heart failure or chronic kidney disease.;Long Term: Maintenance of blood pressure at goal levels.    Lipids Yes    Intervention Provide education and support for participant on nutrition & aerobic/resistive exercise along with prescribed medications to achieve LDL 70mg , HDL >40mg .    Expected Outcomes Short Term: Participant states understanding of desired cholesterol values and is compliant with medications prescribed. Participant is following exercise prescription and nutrition guidelines.;Long Term: Cholesterol controlled with medications as prescribed, with individualized exercise RX and with personalized nutrition plan. Value  goals: LDL < 70mg , HDL > 40 mg.             Core Components/Risk Factors/Patient Goals Review:    Core Components/Risk Factors/Patient Goals at Discharge (Final Review):    ITP Comments:  ITP Comments     Row Name 02/13/23 1227           ITP Comments Armanda Magic, MD:  Medical Director. Introduction to the [facility] / Intensive Cardiac Rehab.  Initial oreintation packet reviewed with the patient.                Comments: Participant attended orientation for the cardiac rehabilitation program on  02/13/2023  to perform initial intake and exercise Nustep test. Patient introduced to the [facility] education and orientation packet was reviewed. Completed 6-minute Nustep test, measurements, initial ITP, and exercise prescription. Vital signs stable. Telemetry- chronic afib, asymptomatic.   Service time was from 9:55 to 12:35.

## 2023-02-14 NOTE — Telephone Encounter (Signed)
-----   Message from [facility] [place] [facility] sent at 02/14/2023  8:53 AM EST ----- It's okay if you can't obtain a weight every session ----- Message ----- From: Cammy Copa, RN Sent: 02/14/2023   8:41 AM EST To: Andrey Farmer, PA-C  Good morning Sharon Vasquez attended cardiac rehab yesterday. her weight was 153.7 kg. It was very hard to obtain a standing weight on her has she cannot stand straight up and will be a transfer to the seated elliptical due to her immobility. Sharon Vasquez wears a clog type shoe and does not own any sneakers as she has chronic pitting edema. What is an acceptable expectation for weight Ms Pavel?  Its going to be very difficult to weigh her 3 times a week as she is a high fall risk.   I appreciate your input!  Sincerely, Sharon Lighter RN Cardiac Rehab

## 2023-02-18 DIAGNOSIS — J449 Chronic obstructive pulmonary disease, unspecified: Secondary | ICD-10-CM | POA: Diagnosis not present

## 2023-02-19 ENCOUNTER — Encounter (HOSPITAL_COMMUNITY)
Admission: RE | Admit: 2023-02-19 | Discharge: 2023-02-19 | Disposition: A | Discharge status: Home / Self Care | Payer: 59 | Source: Ambulatory Visit | Attending: Cardiology | Admitting: Cardiology

## 2023-02-19 DIAGNOSIS — Z5189 Encounter for other specified aftercare: Secondary | ICD-10-CM | POA: Diagnosis not present

## 2023-02-19 DIAGNOSIS — I5022 Chronic systolic (congestive) heart failure: Secondary | ICD-10-CM | POA: Diagnosis not present

## 2023-02-19 NOTE — Progress Notes (Signed)
Cardiac Individual Treatment Plan  Patient Details  Name: Sharon Vasquez [name]: [id number] Date of Birth: 1950-04-22 Referring Provider:   Flowsheet Row INTENSIVE CARDIAC REHAB ORIENT from 02/13/2023 in [facility]  Referring Provider [name], MD       Initial Encounter Date:  Flowsheet Row INTENSIVE CARDIAC REHAB ORIENT from 02/13/2023 in [facility]  Date 02/13/23       Visit Diagnosis: Heart failure, chronic systolic (HCC)  Patient's Home Medications on Admission:  Current Outpatient Medications:    albuterol (VENTOLIN HFA) 108 (90 Base) MCG/ACT inhaler, Inhale 2 puffs into the lungs every 4 (four) hours as needed for shortness of breath., Disp: 18 g, Rfl: 1   alendronate (FOSAMAX) 70 MG tablet, Take 70 mg by mouth once a week. Take with a full glass of water on an empty stomach, Patient hasn't started medication., Disp: , Rfl:    aspirin EC 81 MG tablet, Take 81 mg by mouth daily., Disp: , Rfl:    atorvastatin (LIPITOR) 80 MG tablet, Take 1 tablet (80 mg total) by mouth daily., Disp: 100 tablet, Rfl: 2   empagliflozin (JARDIANCE) 10 MG TABS tablet, Take 1 tablet (10 mg total) by mouth daily before breakfast., Disp: 30 tablet, Rfl: 2   esomeprazole (NEXIUM) 40 MG capsule, Take 1 capsule (40 mg total) by mouth daily as needed (for heartburn or indigestion). (Patient taking differently: Take 40 mg by mouth daily.), Disp: 90 capsule, Rfl: 1   Fluticasone-Umeclidin-Vilant (TRELEGY ELLIPTA) 100-62.5-25 MCG/ACT AEPB, Inhale 1 puff into the lungs daily., Disp: 60 each, Rfl: 5   gabapentin (NEURONTIN) 400 MG capsule, Take 1 capsule (400 mg total) by mouth 2 (two) times daily., Disp: 60 capsule, Rfl: 2   letrozole (FEMARA) 2.5 MG tablet, TAKE 1 TABLET(2.5 MG) BY MOUTH DAILY (Patient taking differently: Take 2.5 mg by mouth daily. TAKE 1 TABLET(2.5 MG) BY MOUTH DAILY),  Disp: 90 tablet, Rfl: 3   LORazepam (ATIVAN) 0.5 MG tablet, Take 0.5 mg by mouth daily as needed for anxiety., Disp: , Rfl:    losartan (COZAAR) 25 MG tablet, Take 1 tablet (25 mg total) by mouth daily. (Patient taking differently: Take 25 mg by mouth daily. Taking 12.5 mg if BP low), Disp: 30 tablet, Rfl: 11   metoprolol succinate (TOPROL-XL) 25 MG 24 hr tablet, Take 1 tablet (25 mg total) by mouth daily., Disp: 100 tablet, Rfl: 2   naloxone (NARCAN) nasal spray 4 mg/0.1 mL, Place 1 spray into the nose once as needed (for overdose suspected). for opioid overdose, Disp: , Rfl:    oxybutynin (DITROPAN) 5 MG tablet, Take 1 tablet (5 mg total) by mouth 3 (three) times daily., Disp: , Rfl:    oxyCODONE (ROXICODONE) 15 MG immediate release tablet, Take 15 mg by mouth See admin instructions. Take 15mg  (1 tablet) by mouth every morning and 15mg  (1 tablet) at bedtime in addition to twice during the day as needed., Disp: , Rfl:    OXYGEN, Inhale 2-3 L into the lungs daily., Disp: , Rfl:    potassium chloride SA (KLOR-CON M) 20 MEQ tablet, Take 2 tablets (40 mEq total) by mouth 2 (two) times daily., Disp: 90 tablet, Rfl: 3   PRESCRIPTION MEDICATION, Inhale into the lungs at bedtime. cpap, Disp: , Rfl:    rivaroxaban (XARELTO) 20 MG TABS tablet, Take 1 tablet (20 mg total) by mouth every morning., Disp: 90  tablet, Rfl: 3   Semaglutide-Weight Management (WEGOVY) 1 MG/0.5ML SOAJ, Inject 1 mg into the skin once a week., Disp: 2 mL, Rfl: 3   spironolactone (ALDACTONE) 25 MG tablet, Take 1 tablet (25 mg total) by mouth daily., Disp: 90 tablet, Rfl: 3   torsemide (DEMADEX) 20 MG tablet, Take 3 tablets (60 mg total) by mouth daily., Disp: 90 tablet, Rfl: 3  Past Medical History: Past Medical History:  Diagnosis Date   A-fib (HCC)    Acute hypoxemic respiratory failure (HCC) 02/20/2022   Acute on chronic hypoxic respiratory failure (HCC) 01/04/2022   Acute upper respiratory infection 01/08/2022   Anxiety     Arthritis    "qwhre; joints, back" (04/17/2017)   Atrial fibrillation (HCC)    Benign breast cyst in female, left 01/08/2017   Found by Screening mammogram, evaluated by [facility] on 01/08/17 and determined to be a benign simple breast cyst.   Breast cancer (HCC)    Cellulitis of left lower leg 05/30/2017   CHF (congestive heart failure) (HCC)    Chronic low back pain 08/21/2016   Chronic lower back pain    Chronic venous insufficiency    /notes 05/30/2017   COPD (chronic obstructive pulmonary disease) (HCC)    Depression    Diabetes (HCC) 01/08/2022   Diabetes mellitus without complication (HCC)    DVT (deep venous thrombosis) (HCC) 11/16/2016   Dysrhythmia    Esophageal dysmotility 11/10/2019   Previous workup by ENT with fiberoptic leryngoscopy which was normal. Dx with laryngeal pharyngeal reflux.   GERD (gastroesophageal reflux disease)    Headache    "weekly for the last 3 months" (04/17/2017)   History of pulmonary embolism    Pulmonary embolism   Hyperlipidemia    Hypertension    Laryngopharyngeal reflux 12/18/2015   Morbid obesity (HCC)    PE (pulmonary embolism)    Pulmonary embolism (HCC) 09/21/2014   Sleep apnea    Stroke (HCC) 12/02/2021    Tobacco Use: Social History   Tobacco Use  Smoking Status Former   Current packs/day: 0.00   Average packs/day: 2.0 packs/day for 60.4 years (120.8 ttl pk-yrs)   Types: Cigarettes   Start date: 08/16/1961   Quit date: 01/02/2022   Years since quitting: 1.1  Smokeless Tobacco Never  Tobacco Comments   Pt smokes about 4 ciggs daily. Stopped 01/02/2022     Labs: Review Flowsheet  More data exists      Latest Ref Rng & Units 04/27/2022 10/28/2022 10/29/2022 10/30/2022 11/01/2022  Labs for ITP Cardiac and Pulmonary Rehab  Trlycerides <150 mg/dL - - 742  - -  Hemoglobin A1c 4.8 - 5.6 % 5.6  - 5.8  - -  PH, Arterial 7.35 - 7.45 - 7.173  7.327  - 7.403  7.420   PCO2 arterial 32 - 48 mmHg - 63.2  40.5  - 50.8  46.2   Bicarbonate 20.0  - 28.0 mmol/L 20.0 - 28.0 mmol/L - 23.2  21.2  - 31.7  31.9  30.0   TCO2 22 - 32 mmol/L 22 - 32 mmol/L - 25  22  22   - 33  33  31   Acid-base deficit 0.0 - 2.0 mmol/L - 6.0  4.0  - -  O2 Saturation % % - 91  99  75.4  71  68  96     Details       Multiple values from one day are sorted in reverse-chronological order  Capillary Blood Glucose: Lab Results  Component Value Date   GLUCAP 200 (H) 12/02/2022   GLUCAP 102 (H) 11/06/2022   GLUCAP 107 (H) 11/06/2022   GLUCAP 124 (H) 11/06/2022   GLUCAP 115 (H) 11/05/2022     Exercise Target Goals: Exercise Program Goal: Individual exercise prescription set using results from initial 6 min walk test and THRR while considering  patient's activity barriers and safety.   Exercise Prescription Goal: Initial exercise prescription builds to 30-45 minutes a day of aerobic activity, 2-3 days per week.  Home exercise guidelines will be given to patient during program as part of exercise prescription that the participant will acknowledge.  Activity Barriers & Risk Stratification:  Activity Barriers & Cardiac Risk Stratification - 02/13/23 1320       Activity Barriers & Cardiac Risk Stratification   Activity Barriers Balance Concerns;Arthritis;Joint Problems;Back Problems;Deconditioning;Assistive Device;Muscular Weakness;Shortness of Breath;Neck/Spine Problems;Other (comment)    Comments Pt does not walk, uses mobility chair    Cardiac Risk Stratification High             6 Minute Walk:  6 Minute Walk     Row Name 02/13/23 1317         6 Minute Walk   Phase Initial  NUSTEP TEST     Distance 1017 feet  NUSTEP     Walk Time 6 minutes     # of Rest Breaks 0     MPH 1.93     METS 1     RPE 11     Perceived Dyspnea  0     VO2 Peak 3.2     Symptoms Yes (comment)     Comments Bilateral knee pain 9/10     Resting HR 86 bpm     Resting BP 110/70     Resting Oxygen Saturation  93 %     Exercise Oxygen Saturation   during 6 min walk 100 %     Max Ex. HR 96 bpm     Max Ex. BP 100/60     2 Minute Post BP 104/70       Interval HR   1 Minute HR 93     2 Minute HR 93     3 Minute HR 85     4 Minute HR 86     5 Minute HR 84     6 Minute HR 94     Interval Heart Rate? Yes       Interval Oxygen   Interval Oxygen? Yes     Baseline Oxygen Saturation % 93 %     1 Minute Oxygen Saturation % 96 %     1 Minute Liters of Oxygen 2 L     2 Minute Oxygen Saturation % 96 %     2 Minute Liters of Oxygen 2 L     3 Minute Oxygen Saturation % 98 %     3 Minute Liters of Oxygen 2 L     4 Minute Oxygen Saturation % 98 %     4 Minute Liters of Oxygen 2 L     5 Minute Oxygen Saturation % 100 %     5 Minute Liters of Oxygen 2 L     6 Minute Oxygen Saturation % 100 %     6 Minute Liters of Oxygen 2 L              Oxygen Initial Assessment:   Oxygen Re-Evaluation:   Oxygen Discharge (  Final Oxygen Re-Evaluation):   Initial Exercise Prescription:  Initial Exercise Prescription - 02/13/23 1300       Date of Initial Exercise RX and Referring Provider   Date 02/13/23    Referring Provider [name], MD    Expected Discharge Date 05/08/22      T5 Nustep   Level 1    SPM 60    Minutes 20    METs 1      Prescription Details   Frequency (times per week) 3    Duration Progress to 30 minutes of continuous aerobic without signs/symptoms of physical distress      Intensity   THRR 40-80% of Max Heartrate 59-118    Ratings of Perceived Exertion 11-13    Perceived Dyspnea 0-4      Progression   Progression Continue progressive overload as per policy without signs/symptoms or physical distress.      Resistance Training   Training Prescription Yes    Weight 2 lbs    Reps 10-15             Perform Capillary Blood Glucose checks as needed.  Exercise Prescription Changes:   Exercise Prescription Changes     Row Name 02/19/23 1400             Response to Exercise   Blood  Pressure (Admit) 120/78       Blood Pressure (Exercise) 124/66       Blood Pressure (Exit) 98/70       Heart Rate (Admit) 102 bpm       Heart Rate (Exercise) 117 bpm       Heart Rate (Exit) 109 bpm       Oxygen Saturation (Exercise) 96 %  O2 @ 2L/min via n/c       Rating of Perceived Exertion (Exercise) 13       Symptoms None       Comments Pt's first day in the CRP2 program       Duration Continue with 30 min of aerobic exercise without signs/symptoms of physical distress.       Intensity THRR unchanged         Progression   Progression Continue to progress workloads to maintain intensity without signs/symptoms of physical distress.       Average METs 1.7         Resistance Training   Training Prescription Yes       Weight 2 lbs       Reps 10-15       Time 10 Minutes         Interval Training   Interval Training No         T5 Nustep   Level 1       SPM 64       Minutes 20       METs 1.6                Exercise Comments:   Exercise Comments     Row Name 02/19/23 1444           Exercise Comments Pt uses mobility chair. Pt is non-ambualtory. Warm-up , cool-down and weight as done in chair. No complaints with todays session.                Exercise Goals and Review:   Exercise Goals     Row Name 02/13/23 1321             Exercise Goals  Increase Physical Activity Yes       Intervention Provide advice, education, support and counseling about physical activity/exercise needs.;Develop an individualized exercise prescription for aerobic and resistive training based on initial evaluation findings, risk stratification, comorbidities and participant's personal goals.       Expected Outcomes Short Term: Attend rehab on a regular basis to increase amount of physical activity.;Long Term: Exercising regularly at least 3-5 days a week.;Long Term: Add in home exercise to make exercise part of routine and to increase amount of physical activity.       Increase  Strength and Stamina Yes       Intervention Provide advice, education, support and counseling about physical activity/exercise needs.;Develop an individualized exercise prescription for aerobic and resistive training based on initial evaluation findings, risk stratification, comorbidities and participant's personal goals.       Expected Outcomes Short Term: Increase workloads from initial exercise prescription for resistance, speed, and METs.;Short Term: Perform resistance training exercises routinely during rehab and add in resistance training at home;Long Term: Improve cardiorespiratory fitness, muscular endurance and strength as measured by increased METs and functional capacity ( )       Able to understand and use rate of perceived exertion (RPE) scale Yes       Intervention Provide education and explanation on how to use RPE scale       Expected Outcomes Short Term: Able to use RPE daily in rehab to express subjective intensity level;Long Term:  Able to use RPE to guide intensity level when exercising independently       Knowledge and understanding of Target Heart Rate Range (THRR) Yes       Intervention Provide education and explanation of THRR including how the numbers were predicted and where they are located for reference       Expected Outcomes Short Term: Able to state/look up THRR;Long Term: Able to use THRR to govern intensity when exercising independently;Short Term: Able to use daily as guideline for intensity in rehab       Understanding of Exercise Prescription Yes       Intervention Provide education, explanation, and written materials on patient's individual exercise prescription       Expected Outcomes Short Term: Able to explain program exercise prescription;Long Term: Able to explain home exercise prescription to exercise independently                Exercise Goals Re-Evaluation :  Exercise Goals Re-Evaluation     Row Name 02/19/23 1441             Exercise Goal  Re-Evaluation   Exercise Goals Review Increase Physical Activity;Understanding of Exercise Prescription;Increase Strength and Stamina;Able to understand and use Dyspnea scale;Knowledge and understanding of Target Heart Rate Range (THRR);Able to understand and use rate of perceived exertion (RPE) scale       Comments Pt's first day in the CRP2 program. Pt understnads exercise RX, THRR and RPE scale.       Expected Outcomes Will continue to montior patient and progress exercise workloads as tolerated.                Discharge Exercise Prescription (Final Exercise Prescription Changes):  Exercise Prescription Changes - 02/19/23 1400       Response to Exercise   Blood Pressure (Admit) 120/78    Blood Pressure (Exercise) 124/66    Blood Pressure (Exit) 98/70    Heart Rate (Admit) 102 bpm    Heart Rate (Exercise) 117 bpm  Heart Rate (Exit) 109 bpm    Oxygen Saturation (Exercise) 96 %   O2 @ 2L/min via n/c   Rating of Perceived Exertion (Exercise) 13    Symptoms None    Comments Pt's first day in the CRP2 program    Duration Continue with 30 min of aerobic exercise without signs/symptoms of physical distress.    Intensity THRR unchanged      Progression   Progression Continue to progress workloads to maintain intensity without signs/symptoms of physical distress.    Average METs 1.7      Resistance Training   Training Prescription Yes    Weight 2 lbs    Reps 10-15    Time 10 Minutes      Interval Training   Interval Training No      T5 Nustep   Level 1    SPM 64    Minutes 20    METs 1.6             Nutrition:  Target Goals: Understanding of nutrition guidelines, daily intake of sodium 1500mg , cholesterol 200mg , calories 30% from fat and 7% or less from saturated fats, daily to have 5 or more servings of fruits and vegetables.  Biometrics:   Post Biometrics - 02/13/23 1110        Post  Biometrics   Waist Circumference --   Pt cannot stand to measure   Hip  Circumference --   Pt cannot stand to measure   Triceps Skinfold 45 mm    % Body Fat --   unable to calculate   Grip Strength 28 kg    Flexibility --   Not perfomed due to mobility   Single Leg Stand --   Pt cannot stand unassisted            Nutrition Therapy Plan and Nutrition Goals:  Nutrition Therapy & Goals - 02/19/23 1335       Nutrition Therapy   Diet Heart Healthy Diet    Drug/Food Interactions Statins/Certain Fruits      Personal Nutrition Goals   Nutrition Goal Patient to identify strategies for reducing cardiovascular risk by attending the [employer] education and nutrition series weekly.    Personal Goal #2 Patient to improve diet quality by using the plate method as a guide for meal planning to include lean protein/plant protein, fruits, vegetables, whole grains, nonfat dairy as part of a well-balanced diet.    Personal Goal #3 Patient to reduce sodium intake to 1500mg  per day.    Personal Goal #4 Patient to identify strategies for weight loss of 0.5-2.0# per week.    Comments Lisamarie has medical history of HFrEF, A-fib, chronic respiratory failure, OSA, pre-diabetes, hyperlipidemia, COPD, peripheral vascular disease. She started [facility] ~August 2024. She does report some concerns of constipation. She reports making some dietary changes including increased high fiber foods and reading food labels for sodium. Her A1c remains in a pre-diabetic range. No recent lipid panel; but historically controlled from 2023. Patient will benefit from participation in intensive cardiac rehab for nutrition, exercise, and lifestyle modification.      Intervention Plan   Intervention Prescribe, educate and counsel regarding individualized specific dietary modifications aiming towards targeted core components such as weight, hypertension, lipid management, diabetes, heart failure and other comorbidities.;Nutrition handout(s) given to patient.    Expected Outcomes Short Term Goal: Understand  basic principles of dietary content, such as calories, fat, sodium, cholesterol and nutrients.;Long Term Goal: Adherence to prescribed nutrition plan.  Nutrition Assessments:  MEDIFICTS Score Key: >=70 Need to make dietary changes  40-70 Heart Healthy Diet <= 40 Therapeutic Level Cholesterol Diet    Picture Your Plate Scores: <47 Unhealthy dietary pattern with much room for improvement. 41-50 Dietary pattern unlikely to meet recommendations for good health and room for improvement. 51-60 More healthful dietary pattern, with some room for improvement.  >60 Healthy dietary pattern, although there may be some specific behaviors that could be improved.    Nutrition Goals Re-Evaluation:  Nutrition Goals Re-Evaluation     Row Name 02/19/23 1335             Goals   Current Weight 338 lb 13.6 oz (153.7 kg)       Comment lipoprotein A 227, A1c 5.8; other most recent labs lipids 08/2021: LDL 46, HDL 26       Expected Outcome Danille has medical history of HFrEF, A-fib, chronic respiratory failure, OSA, pre-diabetes, hyperlipidemia, COPD, peripheral vascular disease. She started [facility] ~August 2024. She does report some concerns of constipation. She reports making some dietary changes including increased high fiber foods and reading food labels for sodium. Her A1c remains in a pre-diabetic range. No recent lipid panel; but historically controlled from 2023. Patient will benefit from participation in intensive cardiac rehab for nutrition, exercise, and lifestyle modification.                Nutrition Goals Re-Evaluation:  Nutrition Goals Re-Evaluation     Row Name 02/19/23 1335             Goals   Current Weight 338 lb 13.6 oz (153.7 kg)       Comment lipoprotein A 227, A1c 5.8; other most recent labs lipids 08/2021: LDL 46, HDL 26       Expected Outcome Mariaclara has medical history of HFrEF, A-fib, chronic respiratory failure, OSA, pre-diabetes, hyperlipidemia, COPD,  peripheral vascular disease. She started [facility] ~August 2024. She does report some concerns of constipation. She reports making some dietary changes including increased high fiber foods and reading food labels for sodium. Her A1c remains in a pre-diabetic range. No recent lipid panel; but historically controlled from 2023. Patient will benefit from participation in intensive cardiac rehab for nutrition, exercise, and lifestyle modification.                Nutrition Goals Discharge (Final Nutrition Goals Re-Evaluation):  Nutrition Goals Re-Evaluation - 02/19/23 1335       Goals   Current Weight 338 lb 13.6 oz (153.7 kg)    Comment lipoprotein A 227, A1c 5.8; other most recent labs lipids 08/2021: LDL 46, HDL 26    Expected Outcome Faige has medical history of HFrEF, A-fib, chronic respiratory failure, OSA, pre-diabetes, hyperlipidemia, COPD, peripheral vascular disease. She started [facility] ~August 2024. She does report some concerns of constipation. She reports making some dietary changes including increased high fiber foods and reading food labels for sodium. Her A1c remains in a pre-diabetic range. No recent lipid panel; but historically controlled from 2023. Patient will benefit from participation in intensive cardiac rehab for nutrition, exercise, and lifestyle modification.             Psychosocial: Target Goals: Acknowledge presence or absence of significant depression and/or stress, maximize coping skills, provide positive support system. Participant is able to verbalize types and ability to use techniques and skills needed for reducing stress and depression.  Initial Review & Psychosocial Screening:  Initial Psych Review & Screening - 02/13/23 1231  Initial Review   Current issues with --   Pt has Ativan for anxiety PRN. Pt voices that she is not depressed, more situational     Family Dynamics   Good Support System? Yes   Pt has her daughters for support     Barriers    Psychosocial barriers to participate in program There are no identifiable barriers or psychosocial needs.      Screening Interventions   Interventions Encouraged to exercise             Quality of Life Scores:  Quality of Life - 02/13/23 1309       Quality of Life   Select Quality of Life      Quality of Life Scores   Health/Function Pre 18 %    Socioeconomic Pre 23 %    Psych/Spiritual Pre 28.29 %    Family Pre 10 %    GLOBAL Pre 20.52 %            Scores of 19 and below usually indicate a poorer quality of life in these areas.  A difference of  2-3 points is a clinically meaningful difference.  A difference of 2-3 points in the total score of the Quality of Life Index has been associated with significant improvement in overall quality of life, self-image, physical symptoms, and general health in studies assessing change in quality of life.  PHQ-9: Review Flowsheet  More data exists      02/13/2023 12/13/2022 11/29/2022 10/25/2022 10/17/2022  Depression screen PHQ 2/9  Decreased Interest 0 0 1 0 0  Down, Depressed, Hopeless 1 0 1 1 0  PHQ - 2 Score 1 0 2 1 0  Altered sleeping 0 1 1 1  0  Tired, decreased energy 3 1 1 2 3   Change in appetite 0 1 1 0 0  Feeling bad or failure about yourself  0 - 0 1 0  Trouble concentrating 0 0 0 0 0  Moving slowly or fidgety/restless 0 0 0 0 0  Suicidal thoughts 0 0 0 0 0  PHQ-9 Score 4 3 5 5 3   Difficult doing work/chores Not difficult at all Not difficult at all Very difficult - Somewhat difficult    Details           Interpretation of Total Score  Total Score Depression Severity:  1-4 = Minimal depression, 5-9 = Mild depression, 10-14 = Moderate depression, 15-19 = Moderately severe depression, 20-27 = Severe depression   Psychosocial Evaluation and Intervention:   Psychosocial Re-Evaluation:  Psychosocial Re-Evaluation     Row Name 02/19/23 1425             Psychosocial Re-Evaluation   Current issues with  Current Anxiety/Panic       Comments Grier did not voice any increased concerns or stressors on her first day of exercise. Will review quality of life questionnare and PHq2-9 in the upcoming week       Expected Outcomes Shaorn will have decreased or controlled anxiety upon completion of cardiac rehab.       Interventions Stress management education;Encouraged to attend Cardiac Rehabilitation for the exercise;Relaxation education       Continue Psychosocial Services  No Follow up required                Psychosocial Discharge (Final Psychosocial Re-Evaluation):  Psychosocial Re-Evaluation - 02/19/23 1425       Psychosocial Re-Evaluation   Current issues with Current Anxiety/Panic    Comments Jasmine December  did not voice any increased concerns or stressors on her first day of exercise. Will review quality of life questionnare and PHq2-9 in the upcoming week    Expected Outcomes Shaorn will have decreased or controlled anxiety upon completion of cardiac rehab.    Interventions Stress management education;Encouraged to attend Cardiac Rehabilitation for the exercise;Relaxation education    Continue Psychosocial Services  No Follow up required             Vocational Rehabilitation: Provide vocational rehab assistance to qualifying candidates.   Vocational Rehab Evaluation & Intervention:  Vocational Rehab - 02/13/23 1328       Initial Vocational Rehab Evaluation & Intervention   Assessment shows need for Vocational Rehabilitation No   Pt is retired            Education: Education Goals: Education classes will be provided on a weekly basis, covering required topics. Participant will state understanding/return demonstration of topics presented.    Education     Row Name 02/19/23 1500     Education   Cardiac Education Topics Pritikin   Select Workshops     Workshops   Educator Exercise Physiologist   Select Exercise   Exercise Workshop Location manager and Fall Prevention    Instruction Review Code 1- Verbalizes Understanding   Class Start Time 1406   Class Stop Time 1500   Class Time Calculation (min) 54 min            Core Videos: Exercise    Move It!  Clinical staff conducted group or individual video education with verbal and written material and guidebook.  Patient learns the recommended Pritikin exercise program. Exercise with the goal of living a long, healthy life. Some of the health benefits of exercise include controlled diabetes, healthier blood pressure levels, improved cholesterol levels, improved heart and lung capacity, improved sleep, and better body composition. Everyone should speak with their doctor before starting or changing an exercise routine.  Biomechanical Limitations Clinical staff conducted group or individual video education with verbal and written material and guidebook.  Patient learns how biomechanical limitations can impact exercise and how we can mitigate and possibly overcome limitations to have an impactful and balanced exercise routine.  Body Composition Clinical staff conducted group or individual video education with verbal and written material and guidebook.  Patient learns that body composition (ratio of muscle mass to fat mass) is a key component to assessing overall fitness, rather than body weight alone. Increased fat mass, especially visceral belly fat, can put [place] at increased risk for metabolic syndrome, type 2 diabetes, heart disease, and even death. It is recommended to combine diet and exercise (cardiovascular and resistance training) to improve your body composition. Seek guidance from your physician and exercise physiologist before implementing an exercise routine.  Exercise Action Plan Clinical staff conducted group or individual video education with verbal and written material and guidebook.  Patient learns the recommended strategies to achieve and enjoy long-term exercise adherence, including variety,  self-motivation, self-efficacy, and positive decision making. Benefits of exercise include fitness, good health, weight management, more energy, better sleep, less stress, and overall well-being.  Medical   Heart Disease Risk Reduction Clinical staff conducted group or individual video education with verbal and written material and guidebook.  Patient learns our heart is our most vital organ as it circulates oxygen, nutrients, white blood cells, and hormones throughout the entire body, and carries waste away. Data supports a plant-based eating plan like the Pritikin Program for its  effectiveness in slowing progression of and reversing heart disease. The video provides a number of recommendations to address heart disease.   Metabolic Syndrome and Belly Fat  Clinical staff conducted group or individual video education with verbal and written material and guidebook.  Patient learns what metabolic syndrome is, how it leads to heart disease, and how one can reverse it and keep it from coming back. You have metabolic syndrome if you have 3 of the following 5 criteria: abdominal obesity, high blood pressure, high triglycerides, low HDL cholesterol, and high blood sugar.  Hypertension and Heart Disease Clinical staff conducted group or individual video education with verbal and written material and guidebook.  Patient learns that high blood pressure, or hypertension, is very common in the [place]. Hypertension is largely due to excessive salt intake, but other important risk factors include being overweight, physical inactivity, drinking too much alcohol, smoking, and not eating enough potassium from fruits and vegetables. High blood pressure is a leading risk factor for heart attack, stroke, congestive heart failure, dementia, kidney failure, and premature death. Long-term effects of excessive salt intake include stiffening of the arteries and thickening of heart muscle and organ damage. Recommendations  include ways to reduce hypertension and the risk of heart disease.  Diseases of Our Time - Focusing on Diabetes Clinical staff conducted group or individual video education with verbal and written material and guidebook.  Patient learns why the best way to stop diseases of our time is prevention, through food and other lifestyle changes. Medicine (such as prescription pills and surgeries) is often only a Band-Aid on the problem, not a long-term solution. Most common diseases of our time include obesity, type 2 diabetes, hypertension, heart disease, and cancer. The Pritikin Program is recommended and has been proven to help reduce, reverse, and/or prevent the damaging effects of metabolic syndrome.  Nutrition   Overview of the Pritikin Eating Plan  Clinical staff conducted group or individual video education with verbal and written material and guidebook.  Patient learns about the Pritikin Eating Plan for disease risk reduction. The Pritikin Eating Plan emphasizes a wide variety of unrefined, minimally-processed carbohydrates, like fruits, vegetables, whole grains, and legumes. Go, Caution, and Stop food choices are explained. Plant-based and lean animal proteins are emphasized. Rationale provided for low sodium intake for blood pressure control, low added sugars for blood sugar stabilization, and low added fats and oils for coronary artery disease risk reduction and weight management.  Calorie Density  Clinical staff conducted group or individual video education with verbal and written material and guidebook.  Patient learns about calorie density and how it impacts the Pritikin Eating Plan. Knowing the characteristics of the food you choose will help you decide whether those foods will lead to weight gain or weight loss, and whether you want to consume more or less of them. Weight loss is usually a side effect of the Pritikin Eating Plan because of its focus on low calorie-dense foods.  Label Reading   Clinical staff conducted group or individual video education with verbal and written material and guidebook.  Patient learns about the Pritikin recommended label reading guidelines and corresponding recommendations regarding calorie density, added sugars, sodium content, and whole grains.  Dining Out - Part 1  Clinical staff conducted group or individual video education with verbal and written material and guidebook.  Patient learns that restaurant meals can be sabotaging because they can be so high in calories, fat, sodium, and/or sugar. Patient learns recommended strategies on how  to positively address this and avoid unhealthy pitfalls.  Facts on Fats  Clinical staff conducted group or individual video education with verbal and written material and guidebook.  Patient learns that lifestyle modifications can be just as effective, if not more so, as many medications for lowering your risk of heart disease. A Pritikin lifestyle can help to reduce your risk of inflammation and atherosclerosis (cholesterol build-up, or plaque, in the artery walls). Lifestyle interventions such as dietary choices and physical activity address the cause of atherosclerosis. A review of the types of fats and their impact on blood cholesterol levels, along with dietary recommendations to reduce fat intake is also included.  Nutrition Action Plan  Clinical staff conducted group or individual video education with verbal and written material and guidebook.  Patient learns how to incorporate Pritikin recommendations into their lifestyle. Recommendations include planning and keeping personal health goals in mind as an important part of their success.  Healthy Mind-Set    Healthy Minds, Bodies, Hearts  Clinical staff conducted group or individual video education with verbal and written material and guidebook.  Patient learns how to identify when they are stressed. Video will discuss the impact of that stress, as well as the  many benefits of stress management. Patient will also be introduced to stress management techniques. The way we think, act, and feel has an impact on our hearts.  How Our Thoughts Can Heal Our Hearts  Clinical staff conducted group or individual video education with verbal and written material and guidebook.  Patient learns that negative thoughts can cause depression and anxiety. This can result in negative lifestyle behavior and serious health problems. Cognitive behavioral therapy is an effective method to help control our thoughts in order to change and improve our emotional outlook.  Additional Videos:  Exercise    Improving Performance  Clinical staff conducted group or individual video education with verbal and written material and guidebook.  Patient learns to use a non-linear approach by alternating intensity levels and lengths of time spent exercising to help burn more calories and lose more body fat. Cardiovascular exercise helps improve heart health, metabolism, hormonal balance, blood sugar control, and recovery from fatigue. Resistance training improves strength, endurance, balance, coordination, reaction time, metabolism, and muscle mass. Flexibility exercise improves circulation, posture, and balance. Seek guidance from your physician and exercise physiologist before implementing an exercise routine and learn your capabilities and proper form for all exercise.  Introduction to Yoga  Clinical staff conducted group or individual video education with verbal and written material and guidebook.  Patient learns about yoga, a discipline of the coming together of mind, breath, and body. The benefits of yoga include improved flexibility, improved range of motion, better posture and core strength, increased lung function, weight loss, and positive self-image. Yoga's heart health benefits include lowered blood pressure, healthier heart rate, decreased cholesterol and triglyceride levels, improved  immune function, and reduced stress. Seek guidance from your physician and exercise physiologist before implementing an exercise routine and learn your capabilities and proper form for all exercise.  Medical   Aging: Enhancing Your Quality of Life  Clinical staff conducted group or individual video education with verbal and written material and guidebook.  Patient learns key strategies and recommendations to stay in good physical health and enhance quality of life, such as prevention strategies, having an advocate, securing a Health Care Proxy and Power of Attorney, and keeping a list of medications and system for tracking them. It also discusses how to avoid  risk for bone loss.  Biology of Weight Control  Clinical staff conducted group or individual video education with verbal and written material and guidebook.  Patient learns that weight gain occurs because we consume more calories than we burn (eating more, moving less). Even if your body weight is normal, you may have higher ratios of fat compared to muscle mass. Too much body fat puts you at increased risk for cardiovascular disease, heart attack, stroke, type 2 diabetes, and obesity-related cancers. In addition to exercise, following the Pritikin Eating Plan can help reduce your risk.  Decoding Lab Results  Clinical staff conducted group or individual video education with verbal and written material and guidebook.  Patient learns that lab test reflects one measurement whose values change over time and are influenced by many factors, including medication, stress, sleep, exercise, food, hydration, pre-existing medical conditions, and more. It is recommended to use the knowledge from this video to become more involved with your lab results and evaluate your numbers to speak with your doctor.   Diseases of Our Time - Overview  Clinical staff conducted group or individual video education with verbal and written material and guidebook.  Patient  learns that according to the CDC, 50% to 70% of chronic diseases (such as obesity, type 2 diabetes, elevated lipids, hypertension, and heart disease) are avoidable through lifestyle improvements including healthier food choices, listening to satiety cues, and increased physical activity.  Sleep Disorders Clinical staff conducted group or individual video education with verbal and written material and guidebook.  Patient learns how good quality and duration of sleep are important to overall health and well-being. Patient also learns about sleep disorders and how they impact health along with recommendations to address them, including discussing with a physician.  Nutrition  Dining Out - Part 2 Clinical staff conducted group or individual video education with verbal and written material and guidebook.  Patient learns how to plan ahead and communicate in order to maximize their dining experience in a healthy and nutritious manner. Included are recommended food choices based on the type of restaurant the patient is visiting.   Fueling a Banker conducted group or individual video education with verbal and written material and guidebook.  There is a strong connection between our food choices and our health. Diseases like obesity and type 2 diabetes are very prevalent and are in large-part due to lifestyle choices. The Pritikin Eating Plan provides plenty of food and hunger-curbing satisfaction. It is easy to follow, affordable, and helps reduce health risks.  Menu Workshop  Clinical staff conducted group or individual video education with verbal and written material and guidebook.  Patient learns that restaurant meals can sabotage health goals because they are often packed with calories, fat, sodium, and sugar. Recommendations include strategies to plan ahead and to communicate with the manager, chef, or server to help order a healthier meal.  Planning Your Eating Strategy   Clinical staff conducted group or individual video education with verbal and written material and guidebook.  Patient learns about the Pritikin Eating Plan and its benefit of reducing the risk of disease. The Pritikin Eating Plan does not focus on calories. Instead, it emphasizes high-quality, nutrient-rich foods. By knowing the characteristics of the foods, we choose, we can determine their calorie density and make informed decisions.  Targeting Your Nutrition Priorities  Clinical staff conducted group or individual video education with verbal and written material and guidebook.  Patient learns that lifestyle habits have a tremendous  impact on disease risk and progression. This video provides eating and physical activity recommendations based on your personal health goals, such as reducing LDL cholesterol, losing weight, preventing or controlling type 2 diabetes, and reducing high blood pressure.  Vitamins and Minerals  Clinical staff conducted group or individual video education with verbal and written material and guidebook.  Patient learns different ways to obtain key vitamins and minerals, including through a recommended healthy diet. It is important to discuss all supplements you take with your doctor.   Healthy Mind-Set    Smoking Cessation  Clinical staff conducted group or individual video education with verbal and written material and guidebook.  Patient learns that cigarette smoking and tobacco addiction pose a serious health risk which affects millions of people. Stopping smoking will significantly reduce the risk of heart disease, lung disease, and many forms of cancer. Recommended strategies for quitting are covered, including working with your doctor to develop a successful plan.  Culinary   Becoming a Set designer conducted group or individual video education with verbal and written material and guidebook.  Patient learns that cooking at home can be healthy,  cost-effective, quick, and puts them in control. Keys to cooking healthy recipes will include looking at your recipe, assessing your equipment needs, planning ahead, making it simple, choosing cost-effective seasonal ingredients, and limiting the use of added fats, salts, and sugars.  Cooking - Breakfast and Snacks  Clinical staff conducted group or individual video education with verbal and written material and guidebook.  Patient learns how important breakfast is to satiety and nutrition through the entire day. Recommendations include key foods to eat during breakfast to help stabilize blood sugar levels and to prevent overeating at meals later in the day. Planning ahead is also a key component.  Cooking - Educational psychologist conducted group or individual video education with verbal and written material and guidebook.  Patient learns eating strategies to improve overall health, including an approach to cook more at home. Recommendations include thinking of animal protein as a side on your plate rather than center stage and focusing instead on lower calorie dense options like vegetables, fruits, whole grains, and plant-based proteins, such as beans. Making sauces in large quantities to freeze for later and leaving the skin on your vegetables are also recommended to maximize your experience.  Cooking - Healthy Salads and Dressing Clinical staff conducted group or individual video education with verbal and written material and guidebook.  Patient learns that vegetables, fruits, whole grains, and legumes are the foundations of the Pritikin Eating Plan. Recommendations include how to incorporate each of these in flavorful and healthy salads, and how to create homemade salad dressings. Proper handling of ingredients is also covered. Cooking - Soups and [employer] - Soups and Desserts Clinical staff conducted group or individual video education with verbal and written material and  guidebook.  Patient learns that Pritikin soups and desserts make for easy, nutritious, and delicious snacks and meal components that are low in sodium, fat, sugar, and calorie density, while high in vitamins, minerals, and filling fiber. Recommendations include simple and healthy ideas for soups and desserts.   Overview     The Pritikin Solution Program Overview Clinical staff conducted group or individual video education with verbal and written material and guidebook.  Patient learns that the results of the Pritikin Program have been documented in more than 100 articles published in peer-reviewed journals, and the benefits include reducing risk  factors for (and, in some cases, even reversing) high cholesterol, high blood pressure, type 2 diabetes, obesity, and more! An overview of the three key pillars of the Pritikin Program will be covered: eating well, doing regular exercise, and having a healthy mind-set.  WORKSHOPS  Exercise: Exercise Basics: Building Your Action Plan Clinical staff led group instruction and group discussion with PowerPoint presentation and patient guidebook. To enhance the learning environment the use of posters, models and videos may be added. At the conclusion of this workshop, patients will comprehend the difference between physical activity and exercise, as well as the benefits of incorporating both, into their routine. Patients will understand the FITT (Frequency, Intensity, Time, and Type) principle and how to use it to build an exercise action plan. In addition, safety concerns and other considerations for exercise and cardiac rehab will be addressed by the presenter. The purpose of this lesson is to promote a comprehensive and effective weekly exercise routine in order to improve patients' overall level of fitness.   Managing Heart Disease: Your Path to a Healthier Heart Clinical staff led group instruction and group discussion with PowerPoint presentation and  patient guidebook. To enhance the learning environment the use of posters, models and videos may be added.At the conclusion of this workshop, patients will understand the anatomy and physiology of the heart. Additionally, they will understand how Pritikin's three pillars impact the risk factors, the progression, and the management of heart disease.  The purpose of this lesson is to provide a high-level overview of the heart, heart disease, and how the Pritikin lifestyle positively impacts risk factors.  Exercise Biomechanics Clinical staff led group instruction and group discussion with PowerPoint presentation and patient guidebook. To enhance the learning environment the use of posters, models and videos may be added. Patients will learn how the structural parts of their bodies function and how these functions impact their daily activities, movement, and exercise. Patients will learn how to promote a neutral spine, learn how to manage pain, and identify ways to improve their physical movement in order to promote healthy living. The purpose of this lesson is to expose patients to common physical limitations that impact physical activity. Participants will learn practical ways to adapt and manage aches and pains, and to minimize their effect on regular exercise. Patients will learn how to maintain good posture while sitting, walking, and lifting.  Balance Training and Fall Prevention  Clinical staff led group instruction and group discussion with PowerPoint presentation and patient guidebook. To enhance the learning environment the use of posters, models and videos may be added. At the conclusion of this workshop, patients will understand the importance of their sensorimotor skills (vision, proprioception, and the vestibular system) in maintaining their ability to balance as they age. Patients will apply a variety of balancing exercises that are appropriate for their current level of function.  Patients will understand the common causes for poor balance, possible solutions to these problems, and ways to modify their physical environment in order to minimize their fall risk. The purpose of this lesson is to teach patients about the importance of maintaining balance as they age and ways to minimize their risk of falling.  WORKSHOPS   Nutrition:  Fueling a Ship broker led group instruction and group discussion with PowerPoint presentation and patient guidebook. To enhance the learning environment the use of posters, models and videos may be added. Patients will review the foundational principles of the Pritikin Eating Plan and understand what constitutes a  serving size in each of the food groups. Patients will also learn Pritikin-friendly foods that are better choices when away from home and review make-ahead meal and snack options. Calorie density will be reviewed and applied to three nutrition priorities: weight maintenance, weight loss, and weight gain. The purpose of this lesson is to reinforce (in a group setting) the key concepts around what patients are recommended to eat and how to apply these guidelines when away from home by planning and selecting Pritikin-friendly options. Patients will understand how calorie density may be adjusted for different weight management goals.  Mindful Eating  Clinical staff led group instruction and group discussion with PowerPoint presentation and patient guidebook. To enhance the learning environment the use of posters, models and videos may be added. Patients will briefly review the concepts of the Pritikin Eating Plan and the importance of low-calorie dense foods. The concept of mindful eating will be introduced as well as the importance of paying attention to internal hunger signals. Triggers for non-hunger eating and techniques for dealing with triggers will be explored. The purpose of this lesson is to provide patients with the  opportunity to review the basic principles of the Pritikin Eating Plan, discuss the value of eating mindfully and how to measure internal cues of hunger and fullness using the Hunger Scale. Patients will also discuss reasons for non-hunger eating and learn strategies to use for controlling emotional eating.  Targeting Your Nutrition Priorities Clinical staff led group instruction and group discussion with PowerPoint presentation and patient guidebook. To enhance the learning environment the use of posters, models and videos may be added. Patients will learn how to determine their genetic susceptibility to disease by reviewing their family history. Patients will gain insight into the importance of diet as part of an overall healthy lifestyle in mitigating the impact of genetics and other environmental insults. The purpose of this lesson is to provide patients with the opportunity to assess their personal nutrition priorities by looking at their family history, their own health history and current risk factors. Patients will also be able to discuss ways of prioritizing and modifying the Pritikin Eating Plan for their highest risk areas  Menu  Clinical staff led group instruction and group discussion with PowerPoint presentation and patient guidebook. To enhance the learning environment the use of posters, models and videos may be added. Using menus brought in from [employer], or printed from [employer], patients will apply the Pritikin dining out guidelines that were presented in the [employer] video. Patients will also be able to practice these guidelines in a variety of provided scenarios. The purpose of this lesson is to provide patients with the opportunity to practice hands-on learning of the Pritikin Dining Out guidelines with actual menus and practice scenarios.  Label Reading Clinical staff led group instruction and group discussion with PowerPoint presentation and  patient guidebook. To enhance the learning environment the use of posters, models and videos may be added. Patients will review and discuss the Pritikin label reading guidelines presented in [employer]ikin's Label Reading Educational series video. Using fool labels brought in from local grocery stores and markets, patients will apply the label reading guidelines and determine if the packaged food meet the Pritikin guidelines. The purpose of this lesson is to provide patients with the opportunity to review, discuss, and practice hands-on learning of the Pritikin Label Reading guidelines with actual packaged food labels. Cooking School  Pritikin's [employer] are designed to teach patients ways to  prepare quick, simple, and affordable recipes at home. The importance of nutrition's role in chronic disease risk reduction is reflected in its emphasis in the overall Pritikin program. By learning how to prepare essential core Pritikin Eating Plan recipes, patients will increase control over what they eat; be able to customize the flavor of foods without the use of added salt, sugar, or fat; and improve the quality of the food they consume. By learning a set of core recipes which are easily assembled, quickly prepared, and affordable, patients are more likely to prepare more healthy foods at home. These workshops focus on convenient breakfasts, simple entres, side dishes, and desserts which can be prepared with minimal effort and are consistent with nutrition recommendations for cardiovascular risk reduction. Cooking [employer] are taught by a Armed forces logistics/support/administrative officer (RD) who has been trained by the [employer]. The chef or RD has a clear understanding of the importance of minimizing - if not completely eliminating - added fat, sugar, and sodium in recipes. Throughout the series of Cooking School Workshop sessions, patients will learn about healthy ingredients and efficient methods of  cooking to build confidence in their capability to prepare    Cooking School weekly topics:  Adding Flavor- Sodium-Free  Fast and Healthy Breakfasts  Powerhouse Plant-Based Proteins  Satisfying Salads and Dressings  Simple Sides and Sauces  International Cuisine-Spotlight on the [employer] Zones  Delicious Desserts  Savory Soups  [employer] - Meals in a Astronomer Appetizers and Snacks  Comforting Weekend Breakfasts  One-Pot Wonders   Fast Evening Meals  Landscape architect Your Pritikin Plate  WORKSHOPS   Healthy Mindset (Psychosocial):  Focused Goals, Sustainable Changes Clinical staff led group instruction and group discussion with PowerPoint presentation and patient guidebook. To enhance the learning environment the use of posters, models and videos may be added. Patients will be able to apply effective goal setting strategies to establish at least one personal goal, and then take consistent, meaningful action toward that goal. They will learn to identify common barriers to achieving personal goals and develop strategies to overcome them. Patients will also gain an understanding of how our mind-set can impact our ability to achieve goals and the importance of cultivating a positive and growth-oriented mind-set. The purpose of this lesson is to provide patients with a deeper understanding of how to set and achieve personal goals, as well as the tools and strategies needed to overcome common obstacles which may arise along the way.  From Head to Heart: The Power of a Healthy Outlook  Clinical staff led group instruction and group discussion with PowerPoint presentation and patient guidebook. To enhance the learning environment the use of posters, models and videos may be added. Patients will be able to recognize and describe the impact of emotions and mood on physical health. They will discover the importance of self-care and explore self-care practices which may work  for them. Patients will also learn how to utilize the 4 C's to cultivate a healthier outlook and better manage stress and challenges. The purpose of this lesson is to demonstrate to patients how a healthy outlook is an essential part of maintaining good health, especially as they continue their cardiac rehab journey.  Healthy Sleep for a Healthy Heart Clinical staff led group instruction and group discussion with PowerPoint presentation and patient guidebook. To enhance the learning environment the use of posters, models and videos may be added. At the conclusion of this workshop, patients will  be able to demonstrate knowledge of the importance of sleep to overall health, well-being, and quality of life. They will understand the symptoms of, and treatments for, common sleep disorders. Patients will also be able to identify daytime and nighttime behaviors which impact sleep, and they will be able to apply these tools to help manage sleep-related challenges. The purpose of this lesson is to provide patients with a general overview of sleep and outline the importance of quality sleep. Patients will learn about a few of the most common sleep disorders. Patients will also be introduced to the concept of "sleep hygiene," and discover ways to self-manage certain sleeping problems through simple daily behavior changes. Finally, the workshop will motivate patients by clarifying the links between quality sleep and their goals of heart-healthy living.   Recognizing and Reducing Stress Clinical staff led group instruction and group discussion with PowerPoint presentation and patient guidebook. To enhance the learning environment the use of posters, models and videos may be added. At the conclusion of this workshop, patients will be able to understand the types of stress reactions, differentiate between acute and chronic stress, and recognize the impact that chronic stress has on their health. They will also be able to  apply different coping mechanisms, such as reframing negative self-talk. Patients will have the opportunity to practice a variety of stress management techniques, such as deep abdominal breathing, progressive muscle relaxation, and/or guided imagery.  The purpose of this lesson is to educate patients on the role of stress in their lives and to provide healthy techniques for coping with it.  Learning Barriers/Preferences:  Learning Barriers/Preferences - 02/13/23 1328       Learning Barriers/Preferences   Learning Barriers Sight    Learning Preferences Video;Skilled Demonstration;Individual Instruction;Group Instruction;Computer/Internet;Pictoral             Education Topics:  Knowledge Questionnaire Score:  Knowledge Questionnaire Score - 02/13/23 1331       Knowledge Questionnaire Score   Pre Score 21/24             Core Components/Risk Factors/Patient Goals at Admission:  Personal Goals and Risk Factors at Admission - 02/13/23 1328       Core Components/Risk Factors/Patient Goals on Admission    Weight Management Yes;Obesity;Weight Loss    Intervention Weight Management: Develop a combined nutrition and exercise program designed to reach desired caloric intake, while maintaining appropriate intake of nutrient and fiber, sodium and fats, and appropriate energy expenditure required for the weight goal.;Weight Management: Provide education and appropriate resources to help participant work on and attain dietary goals.;Weight Management/Obesity: Establish reasonable short term and long term weight goals.;Obesity: Provide education and appropriate resources to help participant work on and attain dietary goals.    Admit Weight 338 lb 13.6 oz (153.7 kg)    Expected Outcomes Short Term: Continue to assess and modify interventions until short term weight is achieved;Long Term: Adherence to nutrition and physical activity/exercise program aimed toward attainment of established weight  goal;Weight Loss: Understanding of general recommendations for a balanced deficit meal plan, which promotes 1-2 lb weight loss per week and includes a negative energy balance of [id number] kcal/d;Understanding recommendations for meals to include 15-35% energy as protein, 25-35% energy from fat, 35-60% energy from carbohydrates, less than 200mg  of dietary cholesterol, 20-35 gm of total fiber daily;Understanding of distribution of calorie intake throughout the day with the consumption of 4-5 meals/snacks    Improve shortness of breath with ADL's Yes    Intervention Provide education,  individualized exercise plan and daily activity instruction to help decrease symptoms of SOB with activities of daily living.    Expected Outcomes Short Term: Improve cardiorespiratory fitness to achieve a reduction of symptoms when performing ADLs    Heart Failure Yes    Intervention Provide a combined exercise and nutrition program that is supplemented with education, support and counseling about heart failure. Directed toward relieving symptoms such as shortness of breath, decreased exercise tolerance, and extremity edema.    Expected Outcomes Improve functional capacity of life;Short term: Attendance in program 2-3 days a week with increased exercise capacity. Reported lower sodium intake. Reported increased fruit and vegetable intake. Reports medication compliance.;Short term: Daily weights obtained and reported for increase. Utilizing diuretic protocols set by physician.;Long term: Adoption of self-care skills and reduction of barriers for early signs and symptoms recognition and intervention leading to self-care maintenance.    Hypertension Yes    Intervention Provide education on lifestyle modifcations including regular physical activity/exercise, weight management, moderate sodium restriction and increased consumption of fresh fruit, vegetables, and low fat dairy, alcohol moderation, and smoking cessation.;Monitor  prescription use compliance.    Expected Outcomes Short Term: Continued assessment and intervention until BP is < 140/8mm HG in hypertensive participants. < 130/50mm HG in hypertensive participants with diabetes, heart failure or chronic kidney disease.;Long Term: Maintenance of blood pressure at goal levels.    Lipids Yes    Intervention Provide education and support for participant on nutrition & aerobic/resistive exercise along with prescribed medications to achieve LDL 70mg , HDL >40mg .    Expected Outcomes Short Term: Participant states understanding of desired cholesterol values and is compliant with medications prescribed. Participant is following exercise prescription and nutrition guidelines.;Long Term: Cholesterol controlled with medications as prescribed, with individualized exercise RX and with personalized nutrition plan. Value goals: LDL < 70mg , HDL > 40 mg.             Core Components/Risk Factors/Patient Goals Review:   Goals and Risk Factor Review     Row Name 02/19/23 1428             Core Components/Risk Factors/Patient Goals Review   Personal Goals Review Weight Management/Obesity;Improve shortness of breath with ADL's;Heart Failure;Hypertension;Lipids       Review Bethsy started cardiac rehab on 02/19/23. Syera did well with exercise. Vital signs were stable.  Oxygen satruation were wNL. Rolene uses a mobility scooter and wore oxygen with exercise.       Expected Outcomes Semira will continue to participate in cardiac rehab for exercise, nutrition and lifestyle modifications                Core Components/Risk Factors/Patient Goals at Discharge (Final Review):   Goals and Risk Factor Review - 02/19/23 1428       Core Components/Risk Factors/Patient Goals Review   Personal Goals Review Weight Management/Obesity;Improve shortness of breath with ADL's;Heart Failure;Hypertension;Lipids    Review Valbona started cardiac rehab on 02/19/23. Shayona did well with  exercise. Vital signs were stable.  Oxygen satruation were wNL. Izellah uses a mobility scooter and wore oxygen with exercise.    Expected Outcomes Nazyiah will continue to participate in cardiac rehab for exercise, nutrition and lifestyle modifications             ITP Comments:  ITP Comments     Row Name 02/13/23 1227 02/19/23 1423         ITP Comments Armanda Magic, MD:  Medical Director. Introduction to the [employer] /  Intensive Cardiac Rehab.  Initial oreintation packet reviewed with the patient. 30 Day ITP Review. Thessaly started exercise at cardiac rehab on 02/19/23. Yvonnda did well with exercise for her fintess level               Comments: Pt started cardiac rehab today.  Pt tolerated light exercise without difficulty. VSS, telemetry-chronic afib, asymptomatic.  Medication list reconciled. Pt denies barriers to medicaiton compliance.  PSYCHOSOCIAL ASSESSMENT:  PHQ-4. Pt exhibits positive coping skills, hopeful outlook with supportive family. No psychosocial needs identified at this time, no psychosocial interventions necessary.    Pt enjoys playing bingo and watching movies.   Pt oriented to exercise equipment and routine.    Understanding verbalized. Thayer Headings RN BSN

## 2023-02-20 DIAGNOSIS — G4733 Obstructive sleep apnea (adult) (pediatric): Secondary | ICD-10-CM | POA: Diagnosis not present

## 2023-02-20 DIAGNOSIS — R2681 Unsteadiness on feet: Secondary | ICD-10-CM | POA: Diagnosis not present

## 2023-02-20 DIAGNOSIS — M170 Bilateral primary osteoarthritis of knee: Secondary | ICD-10-CM | POA: Diagnosis not present

## 2023-02-20 DIAGNOSIS — J9611 Chronic respiratory failure with hypoxia: Secondary | ICD-10-CM | POA: Diagnosis not present

## 2023-02-20 DIAGNOSIS — J9601 Acute respiratory failure with hypoxia: Secondary | ICD-10-CM | POA: Diagnosis not present

## 2023-02-21 ENCOUNTER — Other Ambulatory Visit (HOSPITAL_COMMUNITY): Payer: Self-pay

## 2023-02-21 ENCOUNTER — Encounter (HOSPITAL_COMMUNITY)
Admission: RE | Admit: 2023-02-21 | Discharge: 2023-02-21 | Discharge status: Home / Self Care | Payer: 59 | Source: Ambulatory Visit | Attending: Cardiology

## 2023-02-21 DIAGNOSIS — I5022 Chronic systolic (congestive) heart failure: Secondary | ICD-10-CM

## 2023-02-21 DIAGNOSIS — I482 Chronic atrial fibrillation, unspecified: Secondary | ICD-10-CM

## 2023-02-21 DIAGNOSIS — Z5189 Encounter for other specified aftercare: Secondary | ICD-10-CM | POA: Diagnosis not present

## 2023-02-21 MED ORDER — EMPAGLIFLOZIN 10 MG PO TABS: 10.0000 mg | ORAL_TABLET | Freq: Every day | ORAL | 2 refills | Status: DC

## 2023-02-22 ENCOUNTER — Ambulatory Visit: Payer: Self-pay

## 2023-02-22 NOTE — Patient Instructions (Signed)
Visit Information  Thank you for taking time to visit with me today. Please don't hesitate to contact me if I can be of assistance to you.   Following are the goals we discussed today:   Goals Addressed             This Visit's Progress    I want to monitor and control my conditions       Patient Goals/Self Care Activities: -Patient/Caregiver will take medications as prescribed   -Patient/Caregiver will attend all scheduled provider appointments -Patient/Caregiver will call pharmacy for medication refills 3-7 days in advance of running out of medications -Patient/Caregiver will call provider office for new concerns or questions  -Patient/Caregiver will focus on medication adherence by taking medications as prescribed  -Weigh daily and record (notify MD with 3 lb weight gain over night or 5 lb in a week) -Follow CHF Action Plan -Adhere to low sodium diet  -Keep your airway clear from mucus build up  -Use a humidifier, if needed -Self assess COPD action plan zone and make appointment with provider if you have been in the yellow zone for 48 hours without improvement.  -Take the medications prescribed to control your heart rate and rhythm and reduce your risk of blood clot formation (anticoagulants or antiplatelet medications/"blood thinners"). -Learn to check your pulse and write down the number every day.   -continue yourwork with cardiac rehab         Our next appointment is by telephone on 03/20/23 at 11 am  Please call the care guide team at [phone] if you need to cancel or reschedule your appointment.   If you are experiencing a Mental Health or Behavioral Health Crisis or need someone to talk to, please call 1-800-273-TALK (toll free, 24 hour hotline)  Patient verbalizes understanding of instructions and care plan provided today and agrees to view in MyChart. Active MyChart status and patient understanding of how to access instructions and care plan via MyChart  confirmed with patient.     Juanell Fairly RN, BSN, [facility]  [facility], [facility]  Care Coordinator Phone: [phone]

## 2023-02-22 NOTE — Patient Outreach (Signed)
  Care Coordination   Follow Up Visit Note   02/22/2023 Name: Sharon Vasquez [name]: [id number] DOB: 1950-04-12  Sharon Vasquez is a 72 y.o. year old female who sees Atway, Rayann N, DO for primary care. I spoke with  Sharon Vasquez by phone today.  What matters to the patients health and wellness today?  Sharon Vasquez mentioned she was doing well and had no issues with her breathing. She attends cardiac rehabilitation on Mondays, Wednesdays, and Fridays, which she feels is beneficial. She can stand for extended periods and can pivot with ease. Additionally, she does not experience chest pain or shortness of breath, although she does have some leg swelling, which is not unusual for her. She continues to take her medications as prescribed.    Goals Addressed             This Visit's Progress    I want to monitor and control my conditions       Patient Goals/Self Care Activities: -Patient/Caregiver will take medications as prescribed   -Patient/Caregiver will attend all scheduled provider appointments -Patient/Caregiver will call pharmacy for medication refills 3-7 days in advance of running out of medications -Patient/Caregiver will call provider office for new concerns or questions  -Patient/Caregiver will focus on medication adherence by taking medications as prescribed  -Weigh daily and record (notify MD with 3 lb weight gain over night or 5 lb in a week) -Follow CHF Action Plan -Adhere to low sodium diet  -Keep your airway clear from mucus build up  -Use a humidifier, if needed -Self assess COPD action plan zone and make appointment with provider if you have been in the yellow zone for 48 hours without improvement.  -Take the medications prescribed to control your heart rate and rhythm and reduce your risk of blood clot formation (anticoagulants or antiplatelet medications/"blood thinners"). -Learn to check your pulse and write down the number every day.   -continue yourwork with cardiac  rehab         SDOH assessments and interventions completed:  No     Care Coordination Interventions:  Yes, provided   Interventions Today    Flowsheet Row Most Recent Value  Chronic Disease   Chronic disease during today's visit Chronic Obstructive Pulmonary Disease (COPD), Congestive Heart Failure (CHF)  General Interventions   General Interventions Discussed/Reviewed General Interventions Discussed  Exercise Interventions   Exercise Discussed/Reviewed Exercise Discussed  Pharmacy Interventions   Pharmacy Dicussed/Reviewed Pharmacy Topics Discussed  Safety Interventions   Safety Discussed/Reviewed Safety Discussed        Follow up plan: Follow up call scheduled for 03/20/23  11 am    Encounter Outcome:  Patient Visit Completed   Juanell Fairly RN, BSN, [facility]  [facility], [facility]  Care Coordinator Phone: [phone]

## 2023-02-23 ENCOUNTER — Encounter (HOSPITAL_COMMUNITY)
Admission: RE | Admit: 2023-02-23 | Discharge: 2023-02-23 | Disposition: A | Discharge status: Home / Self Care | Payer: 59 | Source: Ambulatory Visit | Attending: Cardiology | Admitting: Cardiology

## 2023-02-23 DIAGNOSIS — I5022 Chronic systolic (congestive) heart failure: Secondary | ICD-10-CM | POA: Diagnosis not present

## 2023-02-23 DIAGNOSIS — M25562 Pain in left knee: Secondary | ICD-10-CM | POA: Diagnosis not present

## 2023-02-23 DIAGNOSIS — R030 Elevated blood-pressure reading, without diagnosis of hypertension: Secondary | ICD-10-CM | POA: Diagnosis not present

## 2023-02-23 DIAGNOSIS — M25561 Pain in right knee: Secondary | ICD-10-CM | POA: Diagnosis not present

## 2023-02-23 DIAGNOSIS — E119 Type 2 diabetes mellitus without complications: Secondary | ICD-10-CM | POA: Diagnosis not present

## 2023-02-23 DIAGNOSIS — Z5189 Encounter for other specified aftercare: Secondary | ICD-10-CM | POA: Diagnosis not present

## 2023-02-23 DIAGNOSIS — I509 Heart failure, unspecified: Secondary | ICD-10-CM | POA: Diagnosis not present

## 2023-02-23 DIAGNOSIS — I10 Essential (primary) hypertension: Secondary | ICD-10-CM | POA: Diagnosis not present

## 2023-02-23 DIAGNOSIS — J439 Emphysema, unspecified: Secondary | ICD-10-CM | POA: Diagnosis not present

## 2023-02-23 DIAGNOSIS — I4891 Unspecified atrial fibrillation: Secondary | ICD-10-CM | POA: Diagnosis not present

## 2023-02-23 DIAGNOSIS — Z79899 Other long term (current) drug therapy: Secondary | ICD-10-CM | POA: Diagnosis not present

## 2023-02-23 DIAGNOSIS — J449 Chronic obstructive pulmonary disease, unspecified: Secondary | ICD-10-CM | POA: Diagnosis not present

## 2023-02-23 DIAGNOSIS — G8929 Other chronic pain: Secondary | ICD-10-CM | POA: Diagnosis not present

## 2023-02-26 ENCOUNTER — Encounter (HOSPITAL_COMMUNITY)
Admission: RE | Admit: 2023-02-26 | Discharge: 2023-02-26 | Disposition: A | Discharge status: Home / Self Care | Payer: 59 | Source: Ambulatory Visit | Attending: Cardiology

## 2023-02-26 DIAGNOSIS — I5022 Chronic systolic (congestive) heart failure: Secondary | ICD-10-CM

## 2023-02-26 DIAGNOSIS — Z5189 Encounter for other specified aftercare: Secondary | ICD-10-CM | POA: Diagnosis not present

## 2023-02-27 ENCOUNTER — Encounter (HOSPITAL_BASED_OUTPATIENT_CLINIC_OR_DEPARTMENT_OTHER): Payer: 59 | Admitting: Internal Medicine

## 2023-02-28 ENCOUNTER — Encounter (HOSPITAL_COMMUNITY): Payer: 59 | Admitting: Cardiology

## 2023-02-28 ENCOUNTER — Encounter (HOSPITAL_COMMUNITY): Payer: 59

## 2023-03-02 ENCOUNTER — Encounter (HOSPITAL_COMMUNITY): Payer: 59

## 2023-03-02 DIAGNOSIS — Z79899 Other long term (current) drug therapy: Secondary | ICD-10-CM | POA: Diagnosis not present

## 2023-03-05 ENCOUNTER — Ambulatory Visit
Admission: RE | Admit: 2023-03-05 | Discharge: 2023-03-05 | Disposition: A | Discharge status: Home / Self Care | Payer: 59 | Source: Ambulatory Visit | Attending: Adult Health | Admitting: Adult Health

## 2023-03-05 ENCOUNTER — Encounter (HOSPITAL_COMMUNITY)
Admission: RE | Admit: 2023-03-05 | Discharge: 2023-03-05 | Disposition: A | Discharge status: Home / Self Care | Payer: 59 | Source: Ambulatory Visit | Attending: Cardiology | Admitting: Cardiology

## 2023-03-05 DIAGNOSIS — Z9889 Other specified postprocedural states: Secondary | ICD-10-CM

## 2023-03-05 DIAGNOSIS — I5022 Chronic systolic (congestive) heart failure: Secondary | ICD-10-CM | POA: Insufficient documentation

## 2023-03-05 DIAGNOSIS — Z853 Personal history of malignant neoplasm of breast: Secondary | ICD-10-CM | POA: Diagnosis not present

## 2023-03-05 DIAGNOSIS — I110 Hypertensive heart disease with heart failure: Secondary | ICD-10-CM | POA: Insufficient documentation

## 2023-03-07 ENCOUNTER — Ambulatory Visit (HOSPITAL_BASED_OUTPATIENT_CLINIC_OR_DEPARTMENT_OTHER)
Admission: RE | Admit: 2023-03-07 | Discharge: 2023-03-07 | Disposition: A | Discharge status: Home / Self Care | Payer: 59 | Source: Ambulatory Visit | Attending: Cardiology | Admitting: Cardiology

## 2023-03-07 ENCOUNTER — Ambulatory Visit (HOSPITAL_COMMUNITY)
Admission: RE | Admit: 2023-03-07 | Discharge: 2023-03-07 | Disposition: A | Discharge status: Home / Self Care | Payer: 59 | Source: Ambulatory Visit | Attending: Physician Assistant | Admitting: Physician Assistant

## 2023-03-07 ENCOUNTER — Telehealth: Payer: Self-pay | Admitting: Internal Medicine

## 2023-03-07 ENCOUNTER — Encounter (HOSPITAL_COMMUNITY)
Admission: RE | Admit: 2023-03-07 | Discharge: 2023-03-07 | Disposition: A | Discharge status: Home / Self Care | Payer: 59 | Source: Ambulatory Visit | Attending: Cardiology | Admitting: Cardiology

## 2023-03-07 ENCOUNTER — Encounter (HOSPITAL_COMMUNITY): Payer: Self-pay | Admitting: Cardiology

## 2023-03-07 VITALS — BP 104/60 | HR 95

## 2023-03-07 DIAGNOSIS — I5020 Unspecified systolic (congestive) heart failure: Secondary | ICD-10-CM | POA: Insufficient documentation

## 2023-03-07 DIAGNOSIS — Z79899 Other long term (current) drug therapy: Secondary | ICD-10-CM | POA: Diagnosis not present

## 2023-03-07 DIAGNOSIS — Z86718 Personal history of other venous thrombosis and embolism: Secondary | ICD-10-CM | POA: Insufficient documentation

## 2023-03-07 DIAGNOSIS — Z87891 Personal history of nicotine dependence: Secondary | ICD-10-CM | POA: Insufficient documentation

## 2023-03-07 DIAGNOSIS — J449 Chronic obstructive pulmonary disease, unspecified: Secondary | ICD-10-CM | POA: Insufficient documentation

## 2023-03-07 DIAGNOSIS — Z9981 Dependence on supplemental oxygen: Secondary | ICD-10-CM | POA: Diagnosis not present

## 2023-03-07 DIAGNOSIS — E785 Hyperlipidemia, unspecified: Secondary | ICD-10-CM | POA: Diagnosis not present

## 2023-03-07 DIAGNOSIS — I482 Chronic atrial fibrillation, unspecified: Secondary | ICD-10-CM | POA: Diagnosis not present

## 2023-03-07 DIAGNOSIS — J9611 Chronic respiratory failure with hypoxia: Secondary | ICD-10-CM | POA: Diagnosis not present

## 2023-03-07 DIAGNOSIS — K219 Gastro-esophageal reflux disease without esophagitis: Secondary | ICD-10-CM | POA: Diagnosis not present

## 2023-03-07 DIAGNOSIS — E119 Type 2 diabetes mellitus without complications: Secondary | ICD-10-CM | POA: Insufficient documentation

## 2023-03-07 DIAGNOSIS — I428 Other cardiomyopathies: Secondary | ICD-10-CM | POA: Diagnosis not present

## 2023-03-07 DIAGNOSIS — G4733 Obstructive sleep apnea (adult) (pediatric): Secondary | ICD-10-CM | POA: Diagnosis not present

## 2023-03-07 DIAGNOSIS — Z86711 Personal history of pulmonary embolism: Secondary | ICD-10-CM | POA: Insufficient documentation

## 2023-03-07 DIAGNOSIS — I5022 Chronic systolic (congestive) heart failure: Secondary | ICD-10-CM

## 2023-03-07 DIAGNOSIS — I110 Hypertensive heart disease with heart failure: Secondary | ICD-10-CM | POA: Insufficient documentation

## 2023-03-07 DIAGNOSIS — Z853 Personal history of malignant neoplasm of breast: Secondary | ICD-10-CM | POA: Diagnosis not present

## 2023-03-07 DIAGNOSIS — I10 Essential (primary) hypertension: Secondary | ICD-10-CM

## 2023-03-07 DIAGNOSIS — I5032 Chronic diastolic (congestive) heart failure: Secondary | ICD-10-CM | POA: Diagnosis not present

## 2023-03-07 LAB — ECHOCARDIOGRAM COMPLETE
AR max vel: 4.31 cm2
AV Area VTI: 4.22 cm2
AV Area mean vel: 4.25 cm2
AV Mean grad: 3 mm[Hg]
AV Peak grad: 4.7 mm[Hg]
Ao pk vel: 1.08 m/s
Area-P 1/2: 4.93 cm2
MV VTI: 5.92 cm2
S' Lateral: 4.4 cm
Single Plane A4C EF: 56.9 %

## 2023-03-07 LAB — BASIC METABOLIC PANEL
Anion gap: 7 (ref 5–15)
BUN: 11 mg/dL (ref 8–23)
CO2: 27 mmol/L (ref 22–32)
Calcium: 8.9 mg/dL (ref 8.9–10.3)
Chloride: 106 mmol/L (ref 98–111)
Creatinine, Ser: 1.04 mg/dL — ABNORMAL HIGH (ref 0.44–1.00)
GFR, Estimated: 57 mL/min — ABNORMAL LOW (ref >60)
Glucose, Bld: 89 mg/dL (ref 70–99)
Potassium: 3.8 mmol/L (ref 3.5–5.1)
Sodium: 140 mmol/L (ref 135–145)

## 2023-03-07 MED ORDER — PERFLUTREN LIPID MICROSPHERE
1.0000 mL | INTRAVENOUS | Status: DC | PRN
  Administered 2023-03-07: 3 mL via INTRAVENOUS

## 2023-03-07 NOTE — Progress Notes (Signed)
ADVANCED HEART FAILURE NEW PATIENT CLINIC NOTE  Referring Physician: [name], DO  Primary Care: [name], DO Primary Cardiologist:  HPI: Sharon Vasquez is a 72 y.o. female with a PMH of HFpEF, chronic respiratory failure on home O2, COPD, ? 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      Admitted 01/24 with acute on chronic hypoxic respiratory failure 2/2 hypertensive urgency and acute on chronic HFpEF. She was diuresed and home meds restarted. Apparently had not been taking medications consistently.   Seen in ED 07/04 with elevated blood pressure, lower extremity edema and shortness of breath. There was concern for medication adherence. She was given IV lasix and discharged.    Admitted 10/28/22 with acute hypoxic respiratory failure requiring intubation. Had been off CPAP as machine had stopped working. She was in atrial fibrillation with RVR on presentation. Hospital course c/b respiratory arrest due to mucus plug and developed new acute HFrEF.  Echo showed EF 20% with WMA in LAD territory, possibly due to stress cardiomyopathy. Cath with minimal disease. She was diuresed and started on GDMT. SGLT2 not started d/t UTI.   Seen in [facility] for follow-up 09/05. Volume okay. Started spironolactone 12.5 mg daily. Concern for medication compliance. Sharon Vasquez later increased to 25 mg daily d/t hypokalemia.        SUBJECTIVE: Patient states that overall she is doing well. She has ongoing right knee pain and is awaiting follow-up with her PCP.  She has lost about 40 pounds though still has some ongoing lower extremity edema that is responding well to diuretics.  She reports that her grandson died back in 2022/07/13 and she has a lot of stress from her job which may have led to her heart failure  admission.  PMH, current medications, allergies, social history, and family history reviewed in epic.  PHYSICAL EXAM: Vitals:   03/07/23 1001  BP: 104/60  Pulse: 95  SpO2: 95%   GENERAL: Chronically ill-appearing HEENT: The mucous membranes are pink and moist.   PULM:  Normal work of breathing, clear to auscultation bilaterally. Respirations are unlabored.  CARDIAC:  JVP: Not elevated         Normal rate and regular rhythm, no M/G/R, chronic 2+ lower extremity edema ABDOMEN: Soft, non-tender, non-distended. NEUROLOGIC: Patient is oriented x3 with no focal or lateralizing neurologic deficits.  PSYCH: Patients affect is appropriate, there is no evidence of anxiety or depression.  SKIN: Warm and dry; no lesions or wounds. Warm and well perfused extremities.     Heart failure review: - Classification: Heart failure with improved EF - Etiology: Cardiomyopathy due to Takotsubo - NYHA Class: III - Volume status: Hypervolemic   ASSESSMENT & PLAN:  1. HFrEF, NICM  - Echo 07/24: EF < 20%. LV septum aneurysmal towards apex. RV normal (EF 60-65% in 01/24). - Etiology unclear but possible stress cardiomyopathy. Cath with 35% prox CAD. CO 8.7 CI 3.2  - NYHA III, confounded by deconditioning and obesity -Volume status improving on current diuretic dosing, continue torsemide 60 mg daily - Continue Toprol XL 25 mg daily - Continue Spiro 25 mg daily - Continue Jardiance 10 mg daily.  - Continue Losartan 25 mg daily - Cut back fluid and sodium intake - Repeat echo shows improved EF, etiology likely Takotsubo cardiomyopathy -Working with cardiac rehab -Can refer back  to general cardiology   2. A fib , chronic  -She has been in A fib on EKGs dating back to 2022.  -Rate controlled. Continue current dose of Toprol XL.  -Continue xarelto.   3. Obesity  - Last weight 350 lb. Unable to stand on scale today. - Continue [id number]. Also dicussed diet and increasing activity.   4. Chronic  Hypoxic Respiratory Failure on home oxygen.  -On 2 liters [place]. Sats stable.    5. OSA, severe -Continue CPAP   6. 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

## 2023-03-07 NOTE — Progress Notes (Signed)
  Echocardiogram 2D Echocardiogram has been performed.  Ocie Doyne RDCS 03/07/2023, 9:54 AM

## 2023-03-07 NOTE — Progress Notes (Signed)
Psychosocial Re-evaluate Note - Cardiac Rehab  Patient Details  Name: Sharon Vasquez [name]: [id number] Date of Birth: 15-May-1950 Referring Provider:   Flowsheet Row INTENSIVE CARDIAC REHAB ORIENT from 02/13/2023 in [facility]  Referring Provider [name], MD       Spoke to Sharon Vasquez about her Ferrans and Powers Quality of Life Index scores. Pt scored < 21 for overall quality of life. (Scores of 19 and below usually indicate a poorer quality of life in these areas. A difference of 2-3 points is a clinically meaningful difference. A difference of 2-3 points in the total score of the Quality of Life Index has been associated with significant improvement in overall quality of life, self-image, physical symptoms, and general health in studies assessing change in quality of life.)   Pt states she is feeling stressed due to her transportation issues. She states she was not approved for [facility] services. Sharon Vasquez states her MD's office is working on appeal paperwork. 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 She states that her neighbors come to her with different demands, questions, concerns, and complaints.  Sharon Vasquez also brought up not being medication compliant with her diuretics. She states she doesn't take them everyday because she doesn't know if she is going to be close to a restroom when working with transportation or taking her electric wheelchair on long rides to/from running errands.   Sharon Vasquez also endorses stress due to medical appointments. She stated she has been late to appointments due to transportation and the office not seeing her and telling her she needs to be re-scheduled.   PHQ-9 score: 4, pt checked that is was "not difficult at all" to do her work, take care of things at home or get along with other people. She declines any needs at this time.   Sharon Vasquez currently declines a mental  health professional referral and a Child psychotherapist referral. Informed Sharon Vasquez to inform us if any needs may arise. Sharon Vasquez voiced understanding.

## 2023-03-07 NOTE — Telephone Encounter (Signed)
The pt came in today stating she is having problems with getting transportation.  Spoke with our Nurse Case manager Juanell Fairly who states if a Referral is put in for [facility] they will be glad to reach out to the pt to help her with transportation.

## 2023-03-07 NOTE — Patient Instructions (Signed)
CONGRATULATIONS YOU HAVE GRADUATED FROM THE [facility].  Labs done today, your results will be available in MyChart, we will contact you for abnormal readings.   You have been referred to General Cardiology. They will call you to arrange your appointment.  If you have any questions or concerns before your next appointment please send [place] a message through [place] or call our office at [phone].    TO LEAVE A MESSAGE FOR THE NURSE SELECT OPTION 2, PLEASE LEAVE A MESSAGE INCLUDING: YOUR NAME DATE OF BIRTH CALL BACK NUMBER REASON FOR CALL**this is important as we prioritize the call backs  YOU WILL RECEIVE A CALL BACK THE SAME DAY AS LONG AS YOU CALL BEFORE 4:00 PM  At the [facility], you and your health needs are our priority. As part of our continuing mission to provide you with exceptional heart care, we have created designated Provider Care Teams. These Care Teams include your primary Cardiologist (physician) and Advanced Practice Providers (APPs- Physician Assistants and Nurse Practitioners) who all work together to provide you with the care you need, when you need it.   You may see any of the following providers on your designated Care Team at your next follow up: Dr Arvilla Meres Dr Marca Ancona Dr. Dorthula Nettles Dr. [name] Amy Filbert Schilder, NP Robbie Lis, [place] [facility] [place], [place] Brynda Peon, NP [place] Lee, NP Karle Plumber, PharmD   Please be sure to bring in all your medications bottles to every appointment.    Thank you for choosing [facility]

## 2023-03-08 ENCOUNTER — Ambulatory Visit: Payer: Self-pay

## 2023-03-08 ENCOUNTER — Telehealth: Payer: Self-pay | Admitting: Cardiovascular Disease

## 2023-03-08 ENCOUNTER — Other Ambulatory Visit: Payer: Self-pay | Admitting: Internal Medicine

## 2023-03-08 DIAGNOSIS — N3941 Urge incontinence: Secondary | ICD-10-CM

## 2023-03-08 NOTE — Patient Outreach (Signed)
  Care Coordination   Follow Up Visit Note   03/08/2023 Name: Sharon Vasquez [name]: [id number] DOB: 05/07/1950  Sharon Vasquez is a 72 y.o. year old female who sees Atway, Rayann N, DO for primary care. I spoke with  [name] by phone today.  What matters to the patients health and wellness today?  I spoke with Mrs. Katcher, who reported that her health is improving. She denies experiencing any headaches or shortness of breath but has some leg swelling. She mentioned that she is taking her medication as prescribed. Her main concern is her loss of transportation; currently, she has to rely on the bus, which she finds inconvenient. She stated that the last paperwork was sent to her, but the [facility] office informed her that one of the documents was missing. I also spoke with [place] at the office; she faxed them the necessary papers today.     Goals Addressed             This Visit's Progress    I want to monitor and control my conditions       Patient Goals/Self Care Activities: -Patient/Caregiver will take medications as prescribed   -Patient/Caregiver will attend all scheduled provider appointments -Patient/Caregiver will call pharmacy for medication refills 3-7 days in advance of running out of medications -Patient/Caregiver will call provider office for new concerns or questions  -Patient/Caregiver will focus on medication adherence by taking medications as prescribed  -Weigh daily and record (notify MD with 3 lb weight gain over night or 5 lb in a week) -Follow CHF Action Plan -Adhere to low sodium diet  Wt Readings from Last 3 Encounters:  02/13/23 (!) 338 lb 13.6 oz (153.7 kg)  12/07/22 (!) 350 lb (158.8 kg)  12/01/22 (!) 331 lb 2.1 oz (150.2 kg)   -Keep your airway clear from mucus build up  -Use a humidifier, if needed -Self assess COPD action plan zone and make appointment with provider if you have been in the yellow zone for 48 hours without improvement.  -Take the  medications prescribed to control your heart rate and rhythm and reduce your risk of blood clot formation (anticoagulants or antiplatelet medications/"blood thinners"). -Learn to check your pulse and write down the number every day.   -continue yourwork with cardiac rehab         SDOH assessments and interventions completed:  No     Care Coordination Interventions:  Yes, provided   Follow up plan: Follow up call scheduled for 04/09/22  1  pm    Encounter Outcome:  Patient Visit Completed   Juanell Fairly RN, BSN, [facility]  [facility], [facility]  Care Coordinator Phone: [phone]

## 2023-03-08 NOTE — Telephone Encounter (Signed)
Patient is requesting to switch from Dr. Allyson Sabal to Dr. Flora Lipps. Please advise.

## 2023-03-09 ENCOUNTER — Encounter (HOSPITAL_COMMUNITY)
Admission: RE | Admit: 2023-03-09 | Discharge: 2023-03-09 | Disposition: A | Discharge status: Home / Self Care | Payer: 59 | Source: Ambulatory Visit | Attending: Cardiology

## 2023-03-09 ENCOUNTER — Telehealth: Payer: Self-pay

## 2023-03-09 DIAGNOSIS — I5022 Chronic systolic (congestive) heart failure: Secondary | ICD-10-CM

## 2023-03-09 DIAGNOSIS — I110 Hypertensive heart disease with heart failure: Secondary | ICD-10-CM | POA: Diagnosis not present

## 2023-03-09 NOTE — Patient Instructions (Signed)
Visit Information  Thank you for taking time to visit with me today. Please don't hesitate to contact me if I can be of assistance to you.   Following are the goals we discussed today:   Goals Addressed             This Visit's Progress    I want to monitor and control my conditions       Patient Goals/Self Care Activities: -Patient/Caregiver will take medications as prescribed   -Patient/Caregiver will attend all scheduled provider appointments -Patient/Caregiver will call pharmacy for medication refills 3-7 days in advance of running out of medications -Patient/Caregiver will call provider office for new concerns or questions  -Patient/Caregiver will focus on medication adherence by taking medications as prescribed  -Weigh daily and record (notify MD with 3 lb weight gain over night or 5 lb in a week) -Follow CHF Action Plan -Adhere to low sodium diet  Wt Readings from Last 3 Encounters:  02/13/23 (!) 338 lb 13.6 oz (153.7 kg)  12/07/22 (!) 350 lb (158.8 kg)  12/01/22 (!) 331 lb 2.1 oz (150.2 kg)   -Keep your airway clear from mucus build up  -Use a humidifier, if needed -Self assess COPD action plan zone and make appointment with provider if you have been in the yellow zone for 48 hours without improvement.  -Take the medications prescribed to control your heart rate and rhythm and reduce your risk of blood clot formation (anticoagulants or antiplatelet medications/"blood thinners"). -Learn to check your pulse and write down the number every day.   -continue yourwork with cardiac rehab         Our next appointment is by telephone on 04/09/22 at 1 pm  Please call the care guide team at [phone] if you need to cancel or reschedule your appointment.   If you are experiencing a Mental Health or Behavioral Health Crisis or need someone to talk to, please call 1-800-273-TALK (toll free, 24 hour hotline)  Patient verbalizes understanding of instructions and care plan  provided today and agrees to view in MyChart. Active MyChart status and patient understanding of how to access instructions and care plan via MyChart confirmed with patient.     Juanell Fairly RN, BSN, [facility] [place] [place]  [facility], [facility]  Care Coordinator Phone: [phone]

## 2023-03-09 NOTE — Telephone Encounter (Signed)
   Telephone encounter was:  Unsuccessful.  03/09/2023 Name: Sharon Vasquez [name]: [id number] DOB: 1950-11-02  Unsuccessful outbound call made today to assist with:  Transportation Needs   Outreach Attempt:  1st Attempt Unable to leave a message      [facility], [facility] Guide, Phone: [phone] Website: [email or web address]

## 2023-03-12 ENCOUNTER — Encounter (HOSPITAL_COMMUNITY)
Admission: RE | Admit: 2023-03-12 | Discharge: 2023-03-12 | Disposition: A | Discharge status: Home / Self Care | Payer: 59 | Source: Ambulatory Visit | Attending: Cardiology

## 2023-03-12 ENCOUNTER — Telehealth: Payer: Self-pay

## 2023-03-12 DIAGNOSIS — I110 Hypertensive heart disease with heart failure: Secondary | ICD-10-CM | POA: Diagnosis not present

## 2023-03-12 DIAGNOSIS — I5022 Chronic systolic (congestive) heart failure: Secondary | ICD-10-CM | POA: Diagnosis not present

## 2023-03-12 NOTE — Telephone Encounter (Signed)
   Telephone encounter was:  Successful.  03/12/2023 Name: ELGIN LAINES [name]: [id number] DOB: 1950/12/10  RENECIA SHRODE is a 72 y.o. year old female who is a primary care patient of Atway, Rayann N, DO . The community resource team was consulted for assistance with Transportation Needs   Care guide performed the following interventions: Patient provided with information about care guide support team and interviewed to confirm resource needs.Patient refused transportation resources we have access to and wanted to follow up with PCP.   Follow Up Plan:  No further follow up planned at this time. The patient has been provided with needed resources.    Lenard Forth [place]  [facility], [facility] Guide, Phone: [phone] Website: [email or web address]

## 2023-03-14 ENCOUNTER — Encounter (HOSPITAL_COMMUNITY)
Admission: RE | Admit: 2023-03-14 | Discharge: 2023-03-14 | Disposition: A | Discharge status: Home / Self Care | Payer: 59 | Source: Ambulatory Visit | Attending: Cardiology | Admitting: Cardiology

## 2023-03-14 DIAGNOSIS — I5022 Chronic systolic (congestive) heart failure: Secondary | ICD-10-CM | POA: Diagnosis not present

## 2023-03-14 DIAGNOSIS — I110 Hypertensive heart disease with heart failure: Secondary | ICD-10-CM | POA: Diagnosis not present

## 2023-03-14 NOTE — Progress Notes (Signed)
Cardiac Individual Treatment Plan  Patient Details  Name: Sharon Vasquez [name]: [id number] Date of Birth: November 03, 1950 Referring Provider:   Flowsheet Row INTENSIVE CARDIAC REHAB ORIENT from 02/13/2023 in [facility]  Referring Provider [name], MD       Initial Encounter Date:  Flowsheet Row INTENSIVE CARDIAC REHAB ORIENT from 02/13/2023 in [facility] for [facility], [facility], & Lung [facility]  Date 02/13/23       Visit Diagnosis: Heart failure, chronic systolic (HCC)  Patient's Home Medications on Admission:  Current Outpatient Medications:    albuterol (VENTOLIN HFA) 108 (90 Base) MCG/ACT inhaler, Inhale 2 puffs into the lungs every 4 (four) hours as needed for shortness of breath., Disp: 18 g, Rfl: 1   alendronate (FOSAMAX) 70 MG tablet, Take 70 mg by mouth once a week. Take with a full glass of water on an empty stomach, Patient hasn't started medication., Disp: , Rfl:    aspirin EC 81 MG tablet, Take 81 mg by mouth daily., Disp: , Rfl:    atorvastatin (LIPITOR) 80 MG tablet, Take 1 tablet (80 mg total) by mouth daily., Disp: 100 tablet, Rfl: 2   empagliflozin (JARDIANCE) 10 MG TABS tablet, Take 1 tablet (10 mg total) by mouth daily before breakfast., Disp: 30 tablet, Rfl: 2   esomeprazole (NEXIUM) 40 MG capsule, Take 1 capsule (40 mg total) by mouth daily as needed (for heartburn or indigestion). (Patient taking differently: Take 40 mg by mouth daily.), Disp: 90 capsule, Rfl: 1   Fluticasone-Umeclidin-Vilant (TRELEGY ELLIPTA) 100-62.5-25 MCG/ACT AEPB, Inhale 1 puff into the lungs daily., Disp: 60 each, Rfl: 5   gabapentin (NEURONTIN) 400 MG capsule, Take 1 capsule (400 mg total) by mouth 2 (two) times daily., Disp: 60 capsule, Rfl: 2   letrozole (FEMARA) 2.5 MG tablet, TAKE 1 TABLET(2.5 MG) BY MOUTH DAILY (Patient taking differently: Take 2.5 mg by mouth daily. TAKE 1 TABLET(2.5 MG) BY MOUTH DAILY),  Disp: 90 tablet, Rfl: 3   LORazepam (ATIVAN) 0.5 MG tablet, Take 0.5 mg by mouth daily as needed for anxiety., Disp: , Rfl:    losartan (COZAAR) 25 MG tablet, Take 1 tablet (25 mg total) by mouth daily. (Patient taking differently: Take 25 mg by mouth daily. Taking 12.5 mg if BP low), Disp: 30 tablet, Rfl: 11   metoprolol succinate (TOPROL-XL) 25 MG 24 hr tablet, Take 1 tablet (25 mg total) by mouth daily., Disp: 100 tablet, Rfl: 2   naloxone (NARCAN) nasal spray 4 mg/0.1 mL, Place 1 spray into the nose once as needed (for overdose suspected). for opioid overdose, Disp: , Rfl:    oxybutynin (DITROPAN) 5 MG tablet, TAKE ONE TABLET BY MOUTH THREE TIMES DAILY, Disp: 90 tablet, Rfl: 1   oxyCODONE (ROXICODONE) 15 MG immediate release tablet, Take 15 mg by mouth See admin instructions. Take 15mg  (1 tablet) by mouth every morning and 15mg  (1 tablet) at bedtime in addition to twice during the day as needed., Disp: , Rfl:    OXYGEN, Inhale 2-3 L into the lungs daily., Disp: , Rfl:    potassium chloride SA (KLOR-CON M) 20 MEQ tablet, Take 2 tablets (40 mEq total) by mouth 2 (two) times daily., Disp: 90 tablet, Rfl: 3   PRESCRIPTION MEDICATION, Inhale into the lungs at bedtime. cpap, Disp: , Rfl:    rivaroxaban (XARELTO) 20 MG TABS tablet, Take 1 tablet (20 mg total) by mouth every morning., Disp: 90 tablet, Rfl: 3  Semaglutide-Weight Management (WEGOVY) 1 MG/0.5ML SOAJ, Inject 1 mg into the skin once a week., Disp: 2 mL, Rfl: 3   spironolactone (ALDACTONE) 25 MG tablet, Take 1 tablet (25 mg total) by mouth daily., Disp: 90 tablet, Rfl: 3   torsemide (DEMADEX) 20 MG tablet, Take 3 tablets (60 mg total) by mouth daily., Disp: 90 tablet, Rfl: 3  Past Medical History: Past Medical History:  Diagnosis Date   A-fib (HCC)    Acute hypoxemic respiratory failure (HCC) 02/20/2022   Acute on chronic hypoxic respiratory failure (HCC) 01/04/2022   Acute upper respiratory infection 01/08/2022   Anxiety    Arthritis     "qwhre; joints, back" (04/17/2017)   Atrial fibrillation (HCC)    Benign breast cyst in female, left 01/08/2017   Found by Screening mammogram, evaluated by U/S on 01/08/17 and determined to be a benign simple breast cyst.   Breast cancer (HCC)    Cellulitis of left lower leg 05/30/2017   CHF (congestive heart failure) (HCC)    Chronic low back pain 08/21/2016   Chronic lower back pain    Chronic venous insufficiency    /notes 05/30/2017   COPD (chronic obstructive pulmonary disease) (HCC)    Depression    Diabetes (HCC) 01/08/2022   Diabetes mellitus without complication (HCC)    DVT (deep venous thrombosis) (HCC) 11/16/2016   Dysrhythmia    Esophageal dysmotility 11/10/2019   Previous workup by ENT with fiberoptic leryngoscopy which was normal. Dx with laryngeal pharyngeal reflux.   GERD (gastroesophageal reflux disease)    Headache    "weekly for the last 3 months" (04/17/2017)   History of pulmonary embolism    Pulmonary embolism   Hyperlipidemia    Hypertension    Laryngopharyngeal reflux 12/18/2015   Morbid obesity (HCC)    PE (pulmonary embolism)    Pulmonary embolism (HCC) 09/21/2014   Sleep apnea    Stroke (HCC) 12/02/2021    Tobacco Use: Social History   Tobacco Use  Smoking Status Former   Current packs/day: 0.00   Average packs/day: 2.0 packs/day for 60.4 years (120.8 ttl pk-yrs)   Types: Cigarettes   Start date: 08/16/1961   Quit date: 01/02/2022   Years since quitting: 1.1  Smokeless Tobacco Never  Tobacco Comments   Pt smokes about 4 ciggs daily. Stopped 01/02/2022     Labs: Review Flowsheet  More data exists      Latest Ref Rng & Units 04/27/2022 10/28/2022 10/29/2022 10/30/2022 11/01/2022  Labs for ITP Cardiac and Pulmonary Rehab  Trlycerides <150 mg/dL - - 213  - -  Hemoglobin A1c 4.8 - 5.6 % 5.6  - 5.8  - -  PH, Arterial 7.35 - 7.45 - 7.173  7.327  - 7.403  7.420   PCO2 arterial 32 - 48 mmHg - 63.2  40.5  - 50.8  46.2   Bicarbonate 20.0 - 28.0  mmol/L 20.0 - 28.0 mmol/L - 23.2  21.2  - 31.7  31.9  30.0   TCO2 22 - 32 mmol/L 22 - 32 mmol/L - 25  22  22   - 33  33  31   Acid-base deficit 0.0 - 2.0 mmol/L - 6.0  4.0  - -  O2 Saturation % % - 91  99  75.4  71  68  96     Details       Multiple values from one day are sorted in reverse-chronological order         Capillary Blood Glucose:  Lab Results  Component Value Date   GLUCAP 200 (H) 12/02/2022   GLUCAP 102 (H) 11/06/2022   GLUCAP 107 (H) 11/06/2022   GLUCAP 124 (H) 11/06/2022   GLUCAP 115 (H) 11/05/2022     Exercise Target Goals: Exercise Program Goal: Individual exercise prescription set using results from initial 6 min walk test and THRR while considering  patient's activity barriers and safety.   Exercise Prescription Goal: Initial exercise prescription builds to 30-45 minutes a day of aerobic activity, 2-3 days per week.  Home exercise guidelines will be given to patient during program as part of exercise prescription that the participant will acknowledge.  Activity Barriers & Risk Stratification:  Activity Barriers & Cardiac Risk Stratification - 02/13/23 1320       Activity Barriers & Cardiac Risk Stratification   Activity Barriers Balance Concerns;Arthritis;Joint Problems;Back Problems;Deconditioning;Assistive Device;Muscular Weakness;Shortness of Breath;Neck/Spine Problems;Other (comment)    Comments Pt does not walk, uses mobility chair    Cardiac Risk Stratification High             6 Minute Walk:  6 Minute Walk     Row Name 02/13/23 1317         6 Minute Walk   Phase Initial  NUSTEP TEST     Distance 1017 feet  NUSTEP     Walk Time 6 minutes     # of Rest Breaks 0     MPH 1.93     METS 1     RPE 11     Perceived Dyspnea  0     VO2 Peak 3.2     Symptoms Yes (comment)     Comments Bilateral knee pain 9/10     Resting HR 86 bpm     Resting BP 110/70     Resting Oxygen Saturation  93 %     Exercise Oxygen Saturation  during 6 min  walk 100 %     Max Ex. HR 96 bpm     Max Ex. BP 100/60     2 Minute Post BP 104/70       Interval HR   1 Minute HR 93     2 Minute HR 93     3 Minute HR 85     4 Minute HR 86     5 Minute HR 84     6 Minute HR 94     Interval Heart Rate? Yes       Interval Oxygen   Interval Oxygen? Yes     Baseline Oxygen Saturation % 93 %     1 Minute Oxygen Saturation % 96 %     1 Minute Liters of Oxygen 2 L     2 Minute Oxygen Saturation % 96 %     2 Minute Liters of Oxygen 2 L     3 Minute Oxygen Saturation % 98 %     3 Minute Liters of Oxygen 2 L     4 Minute Oxygen Saturation % 98 %     4 Minute Liters of Oxygen 2 L     5 Minute Oxygen Saturation % 100 %     5 Minute Liters of Oxygen 2 L     6 Minute Oxygen Saturation % 100 %     6 Minute Liters of Oxygen 2 L              Oxygen Initial Assessment:   Oxygen Re-Evaluation:   Oxygen Discharge (Final Oxygen Re-Evaluation):  Initial Exercise Prescription:  Initial Exercise Prescription - 02/13/23 1300       Date of Initial Exercise RX and Referring Provider   Date 02/13/23    Referring Provider [name], MD    Expected Discharge Date 05/08/22      T5 Nustep   Level 1    SPM 60    Minutes 20    METs 1      Prescription Details   Frequency (times per week) 3    Duration Progress to 30 minutes of continuous aerobic without signs/symptoms of physical distress      Intensity   THRR 40-80% of Max Heartrate 59-118    Ratings of Perceived Exertion 11-13    Perceived Dyspnea 0-4      Progression   Progression Continue progressive overload as per policy without signs/symptoms or physical distress.      Resistance Training   Training Prescription Yes    Weight 2 lbs    Reps 10-15             Perform Capillary Blood Glucose checks as needed.  Exercise Prescription Changes:   Exercise Prescription Changes     Row Name 02/19/23 1400 03/09/23 1400           Response to Exercise   Blood Pressure  (Admit) 120/78 108/80      Blood Pressure (Exercise) 124/66 122/70      Blood Pressure (Exit) 98/70 102/70      Heart Rate (Admit) 102 bpm 81 bpm      Heart Rate (Exercise) 117 bpm 111 bpm      Heart Rate (Exit) 109 bpm 89 bpm      Oxygen Saturation (Exercise) 96 %  O2 @ 2L/min via n/c 99 %  2 L/min via N/C      Rating of Perceived Exertion (Exercise) 13 12      Symptoms None None      Comments Pt's first day in the CRP2 program Reviewed METs      Duration Continue with 30 min of aerobic exercise without signs/symptoms of physical distress. Progress to 30 minutes of  aerobic without signs/symptoms of physical distress      Intensity THRR unchanged THRR unchanged        Progression   Progression Continue to progress workloads to maintain intensity without signs/symptoms of physical distress. Continue to progress workloads to maintain intensity without signs/symptoms of physical distress.      Average METs 1.7 1.7        Resistance Training   Training Prescription Yes Yes      Weight 2 lbs 2 lbs      Reps 10-15 10-15      Time 10 Minutes 10 Minutes        Interval Training   Interval Training No No        T5 Nustep   Level 1 1      SPM 64 61      Minutes 20 25      METs 1.6 1.7               Exercise Comments:   Exercise Comments     Row Name 02/19/23 1444 03/09/23 1433         Exercise Comments Pt uses mobility chair. Pt is non-ambualtory. Warm-up , cool-down and weight as done in chair. No complaints with todays session. Reviewed METs. Pt progress is slow. Mets range between 1.7 and 1.8 with exercise.  Exercise Goals and Review:   Exercise Goals     Row Name 02/13/23 1321             Exercise Goals   Increase Physical Activity Yes       Intervention Provide advice, education, support and counseling about physical activity/exercise needs.;Develop an individualized exercise prescription for aerobic and resistive training based on initial  evaluation findings, risk stratification, comorbidities and participant's personal goals.       Expected Outcomes Short Term: Attend rehab on a regular basis to increase amount of physical activity.;Long Term: Exercising regularly at least 3-5 days a week.;Long Term: Add in home exercise to make exercise part of routine and to increase amount of physical activity.       Increase Strength and Stamina Yes       Intervention Provide advice, education, support and counseling about physical activity/exercise needs.;Develop an individualized exercise prescription for aerobic and resistive training based on initial evaluation findings, risk stratification, comorbidities and participant's personal goals.       Expected Outcomes Short Term: Increase workloads from initial exercise prescription for resistance, speed, and METs.;Short Term: Perform resistance training exercises routinely during rehab and add in resistance training at home;Long Term: Improve cardiorespiratory fitness, muscular endurance and strength as measured by increased METs and functional capacity ( )       Able to understand and use rate of perceived exertion (RPE) scale Yes       Intervention Provide education and explanation on how to use RPE scale       Expected Outcomes Short Term: Able to use RPE daily in rehab to express subjective intensity level;Long Term:  Able to use RPE to guide intensity level when exercising independently       Knowledge and understanding of Target Heart Rate Range (THRR) Yes       Intervention Provide education and explanation of THRR including how the numbers were predicted and where they are located for reference       Expected Outcomes Short Term: Able to state/look up THRR;Long Term: Able to use THRR to govern intensity when exercising independently;Short Term: Able to use daily as guideline for intensity in rehab       Understanding of Exercise Prescription Yes       Intervention Provide education,  explanation, and written materials on patient's individual exercise prescription       Expected Outcomes Short Term: Able to explain program exercise prescription;Long Term: Able to explain home exercise prescription to exercise independently                Exercise Goals Re-Evaluation :  Exercise Goals Re-Evaluation     Row Name 02/19/23 1441             Exercise Goal Re-Evaluation   Exercise Goals Review Increase Physical Activity;Understanding of Exercise Prescription;Increase Strength and Stamina;Able to understand and use Dyspnea scale;Knowledge and understanding of Target Heart Rate Range (THRR);Able to understand and use rate of perceived exertion (RPE) scale       Comments Pt's first day in the CRP2 program. Pt understnads exercise RX, THRR and RPE scale.       Expected Outcomes Will continue to montior patient and progress exercise workloads as tolerated.                Discharge Exercise Prescription (Final Exercise Prescription Changes):  Exercise Prescription Changes - 03/09/23 1400       Response to Exercise   Blood Pressure (Admit) 108/80  Blood Pressure (Exercise) 122/70    Blood Pressure (Exit) 102/70    Heart Rate (Admit) 81 bpm    Heart Rate (Exercise) 111 bpm    Heart Rate (Exit) 89 bpm    Oxygen Saturation (Exercise) 99 %   2 L/min via N/C   Rating of Perceived Exertion (Exercise) 12    Symptoms None    Comments Reviewed METs    Duration Progress to 30 minutes of  aerobic without signs/symptoms of physical distress    Intensity THRR unchanged      Progression   Progression Continue to progress workloads to maintain intensity without signs/symptoms of physical distress.    Average METs 1.7      Resistance Training   Training Prescription Yes    Weight 2 lbs    Reps 10-15    Time 10 Minutes      Interval Training   Interval Training No      T5 Nustep   Level 1    SPM 61    Minutes 25    METs 1.7             Nutrition:   Target Goals: Understanding of nutrition guidelines, daily intake of sodium 1500mg , cholesterol 200mg , calories 30% from fat and 7% or less from saturated fats, daily to have 5 or more servings of fruits and vegetables.  Biometrics:   Post Biometrics - 02/13/23 1110        Post  Biometrics   Waist Circumference --   Pt cannot stand to measure   Hip Circumference --   Pt cannot stand to measure   Triceps Skinfold 45 mm    % Body Fat --   unable to calculate   Grip Strength 28 kg    Flexibility --   Not perfomed due to mobility   Single Leg Stand --   Pt cannot stand unassisted            Nutrition Therapy Plan and Nutrition Goals:  Nutrition Therapy & Goals - 02/19/23 1335       Nutrition Therapy   Diet Heart Healthy Diet    Drug/Food Interactions Statins/Certain Fruits      Personal Nutrition Goals   Nutrition Goal Patient to identify strategies for reducing cardiovascular risk by attending the [employer] education and nutrition series weekly.    Personal Goal #2 Patient to improve diet quality by using the plate method as a guide for meal planning to include lean protein/plant protein, fruits, vegetables, whole grains, nonfat dairy as part of a well-balanced diet.    Personal Goal #3 Patient to reduce sodium intake to 1500mg  per day.    Personal Goal #4 Patient to identify strategies for weight loss of 0.5-2.0# per week.    Comments Britten has medical history of HFrEF, A-fib, chronic respiratory failure, OSA, pre-diabetes, hyperlipidemia, COPD, peripheral vascular disease. She started [facility] ~August 2024. She does report some concerns of constipation. She reports making some dietary changes including increased high fiber foods and reading food labels for sodium. Her A1c remains in a pre-diabetic range. No recent lipid panel; but historically controlled from 2023. Patient will benefit from participation in intensive cardiac rehab for nutrition, exercise, and lifestyle  modification.      Intervention Plan   Intervention Prescribe, educate and counsel regarding individualized specific dietary modifications aiming towards targeted core components such as weight, hypertension, lipid management, diabetes, heart failure and other comorbidities.;Nutrition handout(s) given to patient.    Expected Outcomes Short Term  Goal: Understand basic principles of dietary content, such as calories, fat, sodium, cholesterol and nutrients.;Long Term Goal: Adherence to prescribed nutrition plan.             Nutrition Assessments:  Nutrition Assessments - 02/28/23 1553       Rate Your Plate Scores   Pre Score 34            MEDIFICTS Score Key: >=70 Need to make dietary changes  40-70 Heart Healthy Diet <= 40 Therapeutic Level Cholesterol Diet   Flowsheet Row INTENSIVE CARDIAC REHAB from 03/05/2023 in [facility]  Picture Your Plate Total Score on Admission 34      Picture Your Plate Scores: <16 Unhealthy dietary pattern with much room for improvement. 41-50 Dietary pattern unlikely to meet recommendations for good health and room for improvement. 51-60 More healthful dietary pattern, with some room for improvement.  >60 Healthy dietary pattern, although there may be some specific behaviors that could be improved.    Nutrition Goals Re-Evaluation:  Nutrition Goals Re-Evaluation     Row Name 02/19/23 1335             Goals   Current Weight 338 lb 13.6 oz (153.7 kg)       Comment lipoprotein A 227, A1c 5.8; other most recent labs lipids 08/2021: LDL 46, HDL 26       Expected Outcome Cashae has medical history of HFrEF, A-fib, chronic respiratory failure, OSA, pre-diabetes, hyperlipidemia, COPD, peripheral vascular disease. She started [facility] ~August 2024. She does report some concerns of constipation. She reports making some dietary changes including increased high fiber foods and reading food  labels for sodium. Her A1c remains in a pre-diabetic range. No recent lipid panel; but historically controlled from 2023. Patient will benefit from participation in intensive cardiac rehab for nutrition, exercise, and lifestyle modification.                Nutrition Goals Re-Evaluation:  Nutrition Goals Re-Evaluation     Row Name 02/19/23 1335             Goals   Current Weight 338 lb 13.6 oz (153.7 kg)       Comment lipoprotein A 227, A1c 5.8; other most recent labs lipids 08/2021: LDL 46, HDL 26       Expected Outcome Kody has medical history of HFrEF, A-fib, chronic respiratory failure, OSA, pre-diabetes, hyperlipidemia, COPD, peripheral vascular disease. She started [facility] ~August 2024. She does report some concerns of constipation. She reports making some dietary changes including increased high fiber foods and reading food labels for sodium. Her A1c remains in a pre-diabetic range. No recent lipid panel; but historically controlled from 2023. Patient will benefit from participation in intensive cardiac rehab for nutrition, exercise, and lifestyle modification.                Nutrition Goals Discharge (Final Nutrition Goals Re-Evaluation):  Nutrition Goals Re-Evaluation - 02/19/23 1335       Goals   Current Weight 338 lb 13.6 oz (153.7 kg)    Comment lipoprotein A 227, A1c 5.8; other most recent labs lipids 08/2021: LDL 46, HDL 26    Expected Outcome Ilean has medical history of HFrEF, A-fib, chronic respiratory failure, OSA, pre-diabetes, hyperlipidemia, COPD, peripheral vascular disease. She started [facility] ~August 2024. She does report some concerns of constipation. She reports making some dietary changes including increased high fiber foods and reading food labels for  sodium. Her A1c remains in a pre-diabetic range. No recent lipid panel; but historically controlled from 2023. Patient will benefit from participation in intensive cardiac rehab for nutrition, exercise, and  lifestyle modification.             Psychosocial: Target Goals: Acknowledge presence or absence of significant depression and/or stress, maximize coping skills, provide positive support system. Participant is able to verbalize types and ability to use techniques and skills needed for reducing stress and depression.  Initial Review & Psychosocial Screening:  Initial Psych Review & Screening - 02/13/23 1231       Initial Review   Current issues with --   Pt has Ativan for anxiety PRN. Pt voices that she is not depressed, more situational     Family Dynamics   Good Support System? Yes   Pt has her daughters for support     Barriers   Psychosocial barriers to participate in program There are no identifiable barriers or psychosocial needs.      Screening Interventions   Interventions Encouraged to exercise             Quality of Life Scores:  Quality of Life - 02/13/23 1309       Quality of Life   Select Quality of Life      Quality of Life Scores   Health/Function Pre 18 %    Socioeconomic Pre 23 %    Psych/Spiritual Pre 28.29 %    Family Pre 10 %    GLOBAL Pre 20.52 %            Scores of 19 and below usually indicate a poorer quality of life in these areas.  A difference of  2-3 points is a clinically meaningful difference.  A difference of 2-3 points in the total score of the Quality of Life Index has been associated with significant improvement in overall quality of life, self-image, physical symptoms, and general health in studies assessing change in quality of life.  PHQ-9: Review Flowsheet  More data exists      02/13/2023 12/13/2022 11/29/2022 10/25/2022 10/17/2022  Depression screen PHQ 2/9  Decreased Interest 0 0 1 0 0  Down, Depressed, Hopeless 1 0 1 1 0  PHQ - 2 Score 1 0 2 1 0  Altered sleeping 0 1 1 1  0  Tired, decreased energy 3 1 1 2 3   Change in appetite 0 1 1 0 0  Feeling bad or failure about yourself  0 - 0 1 0  Trouble concentrating 0 0 0  0 0  Moving slowly or fidgety/restless 0 0 0 0 0  Suicidal thoughts 0 0 0 0 0  PHQ-9 Score 4 3 5 5 3   Difficult doing work/chores Not difficult at all Not difficult at all Very difficult - Somewhat difficult    Details           Interpretation of Total Score  Total Score Depression Severity:  1-4 = Minimal depression, 5-9 = Mild depression, 10-14 = Moderate depression, 15-19 = Moderately severe depression, 20-27 = Severe depression   Psychosocial Evaluation and Intervention:   Psychosocial Re-Evaluation:  Psychosocial Re-Evaluation     Row Name 02/19/23 1425 03/14/23 1715           Psychosocial Re-Evaluation   Current issues with Current Anxiety/Panic Current Anxiety/Panic      Comments Choua did not voice any increased concerns or stressors on her first day of exercise. Will review quality of life questionnare  and PHq2-9 in the upcoming week Quality of life questionnaire and PHQ2-9 reviewed on 03/07/23.t states she is feeling stressed due to her transportation issues. She states she was not approved for [employer] services. Yulanda states her MD's office is working on appeal paperwork. 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 She states that her neighbors come to her with different demands, questions, concerns, and complaints. Chareen also brought up not being medication compliant with her diuretics  Aubree also endorses stress due to medical appointments. Secora declines the need for counselling at this time.      Expected Outcomes Shaorn will have decreased or controlled anxiety upon completion of cardiac rehab. Shaorn will have decreased or controlled anxiety upon completion of cardiac rehab.      Interventions Stress management education;Encouraged to attend Cardiac Rehabilitation for the exercise;Relaxation education Stress management education;Encouraged to attend Cardiac Rehabilitation for the exercise;Relaxation education      Continue  Psychosocial Services  No Follow up required Follow up required by staff               Psychosocial Discharge (Final Psychosocial Re-Evaluation):  Psychosocial Re-Evaluation - 03/14/23 1715       Psychosocial Re-Evaluation   Current issues with Current Anxiety/Panic    Comments Quality of life questionnaire and PHQ2-9 reviewed on 03/07/23.t states she is feeling stressed due to her transportation issues. She states she was not approved for [facility] services. Delesia states her MD's office is working on appeal paperwork. 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 She states that her neighbors come to her with different demands, questions, concerns, and complaints. Emi also brought up not being medication compliant with her diuretics  Shalaunda also endorses stress due to medical appointments. Naia declines the need for counselling at this time.    Expected Outcomes Shaorn will have decreased or controlled anxiety upon completion of cardiac rehab.    Interventions Stress management education;Encouraged to attend Cardiac Rehabilitation for the exercise;Relaxation education    Continue Psychosocial Services  Follow up required by staff             Vocational Rehabilitation: Provide vocational rehab assistance to qualifying candidates.   Vocational Rehab Evaluation & Intervention:  Vocational Rehab - 02/13/23 1328       Initial Vocational Rehab Evaluation & Intervention   Assessment shows need for Vocational Rehabilitation No   Pt is retired            Education: Education Goals: Education classes will be provided on a weekly basis, covering required topics. Participant will state understanding/return demonstration of topics presented.    Education     Row Name 02/19/23 1500     Education   Cardiac Education Topics Pritikin   Select Workshops     Workshops   Educator Exercise Physiologist   Select Exercise   Exercise Workshop  Location manager and Fall Prevention   Instruction Review Code 1- Verbalizes Understanding   Class Start Time 1406   Class Stop Time 1500   Class Time Calculation (min) 54 min    Row Name 02/21/23 1300     Education   Cardiac Education Topics Pritikin   Customer service manager   Weekly Topic Fast and Healthy Breakfasts   Instruction Review Code 1- Verbalizes Understanding   Class Start Time 1355   Class Stop  Time 1435   Class Time Calculation (min) 40 min    Row Name 02/23/23 1500     Education   Cardiac Education Topics Pritikin   Select Core Videos     Core Videos   Educator Dietitian   Select Nutrition   Nutrition Overview of the Pritikin Eating Plan   Instruction Review Code 1- Verbalizes Understanding   Class Start Time 1400   Class Stop Time 1445   Class Time Calculation (min) 45 min    Row Name 02/26/23 1400     Education   Cardiac Education Topics Pritikin   Nurse, children's Exercise Physiologist   Select Psychosocial   Psychosocial Healthy Minds, Bodies, Hearts   Instruction Review Code 1- Verbalizes Understanding   Class Start Time 1357   Class Stop Time 1429   Class Time Calculation (min) 32 min    Row Name 03/05/23 1400     Education   Cardiac Education Topics Pritikin   Select Core Videos     Core Videos   Educator Exercise Physiologist   Select Nutrition   Nutrition Other  Label Reading   Instruction Review Code 1- Verbalizes Understanding   Class Start Time 1356   Class Stop Time 1430   Class Time Calculation (min) 34 min    Row Name 03/07/23 1300     Education   Cardiac Education Topics Pritikin   Customer service manager   Weekly Topic Tasty Appetizers and Snacks   Instruction Review Code 1- Verbalizes Understanding   Class Start Time 1355   Class Stop Time 1440   Class Time Calculation (min) 45 min    Row Name 03/09/23  1400     Education   Cardiac Education Topics Pritikin   Glass blower/designer Nutrition   Nutrition Workshop Label Reading   Instruction Review Code 1- Verbalizes Understanding   Class Start Time 1401   Class Stop Time 1433   Class Time Calculation (min) 32 min    Row Name 03/12/23 1600     Education   Cardiac Education Topics Pritikin   Select Workshops     Workshops   Educator Exercise Physiologist   Select Psychosocial   Psychosocial Workshop Recognizing and Reducing Stress   Instruction Review Code 1- Verbalizes Understanding   Class Start Time 1142   Class Stop Time 1231   Class Time Calculation (min) 49 min    Row Name 03/14/23 1500     Education   Cardiac Education Topics Pritikin   Customer service manager   Weekly Topic Efficiency Cooking - Meals in a Snap   Instruction Review Code 1- Verbalizes Understanding   Class Start Time 1143   Class Stop Time 1226   Class Time Calculation (min) 43 min            Core Videos: Exercise    Move It!  Clinical staff conducted group or individual video education with verbal and written material and guidebook.  Patient learns the recommended Pritikin exercise program. Exercise with the goal of living a long, healthy life. Some of the health benefits of exercise include controlled diabetes, healthier blood pressure levels, improved cholesterol levels, improved heart and lung capacity, improved sleep, and better body composition. Everyone should speak with their doctor  before starting or changing an exercise routine.  Biomechanical Limitations Clinical staff conducted group or individual video education with verbal and written material and guidebook.  Patient learns how biomechanical limitations can impact exercise and how we can mitigate and possibly overcome limitations to have an impactful and balanced exercise routine.  Body  Composition Clinical staff conducted group or individual video education with verbal and written material and guidebook.  Patient learns that body composition (ratio of muscle mass to fat mass) is a key component to assessing overall fitness, rather than body weight alone. Increased fat mass, especially visceral belly fat, can put [place] at increased risk for metabolic syndrome, type 2 diabetes, heart disease, and even death. It is recommended to combine diet and exercise (cardiovascular and resistance training) to improve your body composition. Seek guidance from your physician and exercise physiologist before implementing an exercise routine.  Exercise Action Plan Clinical staff conducted group or individual video education with verbal and written material and guidebook.  Patient learns the recommended strategies to achieve and enjoy long-term exercise adherence, including variety, self-motivation, self-efficacy, and positive decision making. Benefits of exercise include fitness, good health, weight management, more energy, better sleep, less stress, and overall well-being.  Medical   Heart Disease Risk Reduction Clinical staff conducted group or individual video education with verbal and written material and guidebook.  Patient learns our heart is our most vital organ as it circulates oxygen, nutrients, white blood cells, and hormones throughout the entire body, and carries waste away. Data supports a plant-based eating plan like the Pritikin Program for its effectiveness in slowing progression of and reversing heart disease. The video provides a number of recommendations to address heart disease.   Metabolic Syndrome and Belly Fat  Clinical staff conducted group or individual video education with verbal and written material and guidebook.  Patient learns what metabolic syndrome is, how it leads to heart disease, and how one can reverse it and keep it from coming back. You have metabolic syndrome if  you have 3 of the following 5 criteria: abdominal obesity, high blood pressure, high triglycerides, low HDL cholesterol, and high blood sugar.  Hypertension and Heart Disease Clinical staff conducted group or individual video education with verbal and written material and guidebook.  Patient learns that high blood pressure, or hypertension, is very common in the [place]. Hypertension is largely due to excessive salt intake, but other important risk factors include being overweight, physical inactivity, drinking too much alcohol, smoking, and not eating enough potassium from fruits and vegetables. High blood pressure is a leading risk factor for heart attack, stroke, congestive heart failure, dementia, kidney failure, and premature death. Long-term effects of excessive salt intake include stiffening of the arteries and thickening of heart muscle and organ damage. Recommendations include ways to reduce hypertension and the risk of heart disease.  Diseases of Our Time - Focusing on Diabetes Clinical staff conducted group or individual video education with verbal and written material and guidebook.  Patient learns why the best way to stop diseases of our time is prevention, through food and other lifestyle changes. Medicine (such as prescription pills and surgeries) is often only a Band-Aid on the problem, not a long-term solution. Most common diseases of our time include obesity, type 2 diabetes, hypertension, heart disease, and cancer. The Pritikin Program is recommended and has been proven to help reduce, reverse, and/or prevent the damaging effects of metabolic syndrome.  Nutrition   Overview of the Pritikin Eating Plan  Clinical  staff conducted group or individual video education with verbal and written material and guidebook.  Patient learns about the Pritikin Eating Plan for disease risk reduction. The Pritikin Eating Plan emphasizes a wide variety of unrefined, minimally-processed  carbohydrates, like fruits, vegetables, whole grains, and legumes. Go, Caution, and Stop food choices are explained. Plant-based and lean animal proteins are emphasized. Rationale provided for low sodium intake for blood pressure control, low added sugars for blood sugar stabilization, and low added fats and oils for coronary artery disease risk reduction and weight management.  Calorie Density  Clinical staff conducted group or individual video education with verbal and written material and guidebook.  Patient learns about calorie density and how it impacts the Pritikin Eating Plan. Knowing the characteristics of the food you choose will help you decide whether those foods will lead to weight gain or weight loss, and whether you want to consume more or less of them. Weight loss is usually a side effect of the Pritikin Eating Plan because of its focus on low calorie-dense foods.  Label Reading  Clinical staff conducted group or individual video education with verbal and written material and guidebook.  Patient learns about the Pritikin recommended label reading guidelines and corresponding recommendations regarding calorie density, added sugars, sodium content, and whole grains.  Dining Out - Part 1  Clinical staff conducted group or individual video education with verbal and written material and guidebook.  Patient learns that restaurant meals can be sabotaging because they can be so high in calories, fat, sodium, and/or sugar. Patient learns recommended strategies on how to positively address this and avoid unhealthy pitfalls.  Facts on Fats  Clinical staff conducted group or individual video education with verbal and written material and guidebook.  Patient learns that lifestyle modifications can be just as effective, if not more so, as many medications for lowering your risk of heart disease. A Pritikin lifestyle can help to reduce your risk of inflammation and atherosclerosis (cholesterol  build-up, or plaque, in the artery walls). Lifestyle interventions such as dietary choices and physical activity address the cause of atherosclerosis. A review of the types of fats and their impact on blood cholesterol levels, along with dietary recommendations to reduce fat intake is also included.  Nutrition Action Plan  Clinical staff conducted group or individual video education with verbal and written material and guidebook.  Patient learns how to incorporate Pritikin recommendations into their lifestyle. Recommendations include planning and keeping personal health goals in mind as an important part of their success.  Healthy Mind-Set    Healthy Minds, Bodies, Hearts  Clinical staff conducted group or individual video education with verbal and written material and guidebook.  Patient learns how to identify when they are stressed. Video will discuss the impact of that stress, as well as the many benefits of stress management. Patient will also be introduced to stress management techniques. The way we think, act, and feel has an impact on our hearts.  How Our Thoughts Can Heal Our Hearts  Clinical staff conducted group or individual video education with verbal and written material and guidebook.  Patient learns that negative thoughts can cause depression and anxiety. This can result in negative lifestyle behavior and serious health problems. Cognitive behavioral therapy is an effective method to help control our thoughts in order to change and improve our emotional outlook.  Additional Videos:  Exercise    Improving Performance  Clinical staff conducted group or individual video education with verbal and written material and  guidebook.  Patient learns to use a non-linear approach by alternating intensity levels and lengths of time spent exercising to help burn more calories and lose more body fat. Cardiovascular exercise helps improve heart health, metabolism, hormonal balance, blood sugar  control, and recovery from fatigue. Resistance training improves strength, endurance, balance, coordination, reaction time, metabolism, and muscle mass. Flexibility exercise improves circulation, posture, and balance. Seek guidance from your physician and exercise physiologist before implementing an exercise routine and learn your capabilities and proper form for all exercise.  Introduction to Yoga  Clinical staff conducted group or individual video education with verbal and written material and guidebook.  Patient learns about yoga, a discipline of the coming together of mind, breath, and body. The benefits of yoga include improved flexibility, improved range of motion, better posture and core strength, increased lung function, weight loss, and positive self-image. Yoga's heart health benefits include lowered blood pressure, healthier heart rate, decreased cholesterol and triglyceride levels, improved immune function, and reduced stress. Seek guidance from your physician and exercise physiologist before implementing an exercise routine and learn your capabilities and proper form for all exercise.  Medical   Aging: Enhancing Your Quality of Life  Clinical staff conducted group or individual video education with verbal and written material and guidebook.  Patient learns key strategies and recommendations to stay in good physical health and enhance quality of life, such as prevention strategies, having an advocate, securing a Health Care Proxy and Power of Attorney, and keeping a list of medications and system for tracking them. It also discusses how to avoid risk for bone loss.  Biology of Weight Control  Clinical staff conducted group or individual video education with verbal and written material and guidebook.  Patient learns that weight gain occurs because we consume more calories than we burn (eating more, moving less). Even if your body weight is normal, you may have higher ratios of fat compared to  muscle mass. Too much body fat puts you at increased risk for cardiovascular disease, heart attack, stroke, type 2 diabetes, and obesity-related cancers. In addition to exercise, following the Pritikin Eating Plan can help reduce your risk.  Decoding Lab Results  Clinical staff conducted group or individual video education with verbal and written material and guidebook.  Patient learns that lab test reflects one measurement whose values change over time and are influenced by many factors, including medication, stress, sleep, exercise, food, hydration, pre-existing medical conditions, and more. It is recommended to use the knowledge from this video to become more involved with your lab results and evaluate your numbers to speak with your doctor.   Diseases of Our Time - Overview  Clinical staff conducted group or individual video education with verbal and written material and guidebook.  Patient learns that according to the CDC, 50% to 70% of chronic diseases (such as obesity, type 2 diabetes, elevated lipids, hypertension, and heart disease) are avoidable through lifestyle improvements including healthier food choices, listening to satiety cues, and increased physical activity.  Sleep Disorders Clinical staff conducted group or individual video education with verbal and written material and guidebook.  Patient learns how good quality and duration of sleep are important to overall health and well-being. Patient also learns about sleep disorders and how they impact health along with recommendations to address them, including discussing with a physician.  Nutrition  Dining Out - Part 2 Clinical staff conducted group or individual video education with verbal and written material and guidebook.  Patient learns how to  plan ahead and communicate in order to maximize their dining experience in a healthy and nutritious manner. Included are recommended food choices based on the type of restaurant the patient  is visiting.   Fueling a Banker conducted group or individual video education with verbal and written material and guidebook.  There is a strong connection between our food choices and our health. Diseases like obesity and type 2 diabetes are very prevalent and are in large-part due to lifestyle choices. The Pritikin Eating Plan provides plenty of food and hunger-curbing satisfaction. It is easy to follow, affordable, and helps reduce health risks.  Menu Workshop  Clinical staff conducted group or individual video education with verbal and written material and guidebook.  Patient learns that restaurant meals can sabotage health goals because they are often packed with calories, fat, sodium, and sugar. Recommendations include strategies to plan ahead and to communicate with the manager, chef, or server to help order a healthier meal.  Planning Your Eating Strategy  Clinical staff conducted group or individual video education with verbal and written material and guidebook.  Patient learns about the Pritikin Eating Plan and its benefit of reducing the risk of disease. The Pritikin Eating Plan does not focus on calories. Instead, it emphasizes high-quality, nutrient-rich foods. By knowing the characteristics of the foods, we choose, we can determine their calorie density and make informed decisions.  Targeting Your Nutrition Priorities  Clinical staff conducted group or individual video education with verbal and written material and guidebook.  Patient learns that lifestyle habits have a tremendous impact on disease risk and progression. This video provides eating and physical activity recommendations based on your personal health goals, such as reducing LDL cholesterol, losing weight, preventing or controlling type 2 diabetes, and reducing high blood pressure.  Vitamins and Minerals  Clinical staff conducted group or individual video education with verbal and written material  and guidebook.  Patient learns different ways to obtain key vitamins and minerals, including through a recommended healthy diet. It is important to discuss all supplements you take with your doctor.   Healthy Mind-Set    Smoking Cessation  Clinical staff conducted group or individual video education with verbal and written material and guidebook.  Patient learns that cigarette smoking and tobacco addiction pose a serious health risk which affects millions of people. Stopping smoking will significantly reduce the risk of heart disease, lung disease, and many forms of cancer. Recommended strategies for quitting are covered, including working with your doctor to develop a successful plan.  Culinary   Becoming a Set designer conducted group or individual video education with verbal and written material and guidebook.  Patient learns that cooking at home can be healthy, cost-effective, quick, and puts them in control. Keys to cooking healthy recipes will include looking at your recipe, assessing your equipment needs, planning ahead, making it simple, choosing cost-effective seasonal ingredients, and limiting the use of added fats, salts, and sugars.  Cooking - Breakfast and Snacks  Clinical staff conducted group or individual video education with verbal and written material and guidebook.  Patient learns how important breakfast is to satiety and nutrition through the entire day. Recommendations include key foods to eat during breakfast to help stabilize blood sugar levels and to prevent overeating at meals later in the day. Planning ahead is also a key component.  Cooking - Educational psychologist conducted group or individual video education with verbal and written material and  guidebook.  Patient learns eating strategies to improve overall health, including an approach to cook more at home. Recommendations include thinking of animal protein as a side on your plate rather  than center stage and focusing instead on lower calorie dense options like vegetables, fruits, whole grains, and plant-based proteins, such as beans. Making sauces in large quantities to freeze for later and leaving the skin on your vegetables are also recommended to maximize your experience.  Cooking - Healthy Salads and Dressing Clinical staff conducted group or individual video education with verbal and written material and guidebook.  Patient learns that vegetables, fruits, whole grains, and legumes are the foundations of the Pritikin Eating Plan. Recommendations include how to incorporate each of these in flavorful and healthy salads, and how to create homemade salad dressings. Proper handling of ingredients is also covered. Cooking - Soups and [employer] - Soups and Desserts Clinical staff conducted group or individual video education with verbal and written material and guidebook.  Patient learns that Pritikin soups and desserts make for easy, nutritious, and delicious snacks and meal components that are low in sodium, fat, sugar, and calorie density, while high in vitamins, minerals, and filling fiber. Recommendations include simple and healthy ideas for soups and desserts.   Overview     The Pritikin Solution Program Overview Clinical staff conducted group or individual video education with verbal and written material and guidebook.  Patient learns that the results of the Pritikin Program have been documented in more than 100 articles published in peer-reviewed journals, and the benefits include reducing risk factors for (and, in some cases, even reversing) high cholesterol, high blood pressure, type 2 diabetes, obesity, and more! An overview of the three key pillars of the Pritikin Program will be covered: eating well, doing regular exercise, and having a healthy mind-set.  WORKSHOPS  Exercise: Exercise Basics: Building Your Action Plan Clinical staff led group instruction and  group discussion with PowerPoint presentation and patient guidebook. To enhance the learning environment the use of posters, models and videos may be added. At the conclusion of this workshop, patients will comprehend the difference between physical activity and exercise, as well as the benefits of incorporating both, into their routine. Patients will understand the FITT (Frequency, Intensity, Time, and Type) principle and how to use it to build an exercise action plan. In addition, safety concerns and other considerations for exercise and cardiac rehab will be addressed by the presenter. The purpose of this lesson is to promote a comprehensive and effective weekly exercise routine in order to improve patients' overall level of fitness.   Managing Heart Disease: Your Path to a Healthier Heart Clinical staff led group instruction and group discussion with PowerPoint presentation and patient guidebook. To enhance the learning environment the use of posters, models and videos may be added.At the conclusion of this workshop, patients will understand the anatomy and physiology of the heart. Additionally, they will understand how Pritikin's three pillars impact the risk factors, the progression, and the management of heart disease.  The purpose of this lesson is to provide a high-level overview of the heart, heart disease, and how the Pritikin lifestyle positively impacts risk factors.  Exercise Biomechanics Clinical staff led group instruction and group discussion with PowerPoint presentation and patient guidebook. To enhance the learning environment the use of posters, models and videos may be added. Patients will learn how the structural parts of their bodies function and how these functions impact their daily activities, movement, and exercise.  Patients will learn how to promote a neutral spine, learn how to manage pain, and identify ways to improve their physical movement in order to  promote healthy living. The purpose of this lesson is to expose patients to common physical limitations that impact physical activity. Participants will learn practical ways to adapt and manage aches and pains, and to minimize their effect on regular exercise. Patients will learn how to maintain good posture while sitting, walking, and lifting.  Balance Training and Fall Prevention  Clinical staff led group instruction and group discussion with PowerPoint presentation and patient guidebook. To enhance the learning environment the use of posters, models and videos may be added. At the conclusion of this workshop, patients will understand the importance of their sensorimotor skills (vision, proprioception, and the vestibular system) in maintaining their ability to balance as they age. Patients will apply a variety of balancing exercises that are appropriate for their current level of function. Patients will understand the common causes for poor balance, possible solutions to these problems, and ways to modify their physical environment in order to minimize their fall risk. The purpose of this lesson is to teach patients about the importance of maintaining balance as they age and ways to minimize their risk of falling.  WORKSHOPS   Nutrition:  Fueling a Ship broker led group instruction and group discussion with PowerPoint presentation and patient guidebook. To enhance the learning environment the use of posters, models and videos may be added. Patients will review the foundational principles of the Pritikin Eating Plan and understand what constitutes a serving size in each of the food groups. Patients will also learn Pritikin-friendly foods that are better choices when away from home and review make-ahead meal and snack options. Calorie density will be reviewed and applied to three nutrition priorities: weight maintenance, weight loss, and weight gain. The purpose of this lesson is to  reinforce (in a group setting) the key concepts around what patients are recommended to eat and how to apply these guidelines when away from home by planning and selecting Pritikin-friendly options. Patients will understand how calorie density may be adjusted for different weight management goals.  Mindful Eating  Clinical staff led group instruction and group discussion with PowerPoint presentation and patient guidebook. To enhance the learning environment the use of posters, models and videos may be added. Patients will briefly review the concepts of the Pritikin Eating Plan and the importance of low-calorie dense foods. The concept of mindful eating will be introduced as well as the importance of paying attention to internal hunger signals. Triggers for non-hunger eating and techniques for dealing with triggers will be explored. The purpose of this lesson is to provide patients with the opportunity to review the basic principles of the Pritikin Eating Plan, discuss the value of eating mindfully and how to measure internal cues of hunger and fullness using the Hunger Scale. Patients will also discuss reasons for non-hunger eating and learn strategies to use for controlling emotional eating.  Targeting Your Nutrition Priorities Clinical staff led group instruction and group discussion with PowerPoint presentation and patient guidebook. To enhance the learning environment the use of posters, models and videos may be added. Patients will learn how to determine their genetic susceptibility to disease by reviewing their family history. Patients will gain insight into the importance of diet as part of an overall healthy lifestyle in mitigating the impact of genetics and other environmental insults. The purpose of this lesson is to provide patients with  the opportunity to assess their personal nutrition priorities by looking at their family history, their own health history and current risk factors. Patients will  also be able to discuss ways of prioritizing and modifying the Pritikin Eating Plan for their highest risk areas  Menu  Clinical staff led group instruction and group discussion with PowerPoint presentation and patient guidebook. To enhance the learning environment the use of posters, models and videos may be added. Using menus brought in from [employer], or printed from [employer], patients will apply the Pritikin dining out guidelines that were presented in the [employer] video. Patients will also be able to practice these guidelines in a variety of provided scenarios. The purpose of this lesson is to provide patients with the opportunity to practice hands-on learning of the Pritikin Dining Out guidelines with actual menus and practice scenarios.  Label Reading Clinical staff led group instruction and group discussion with PowerPoint presentation and patient guidebook. To enhance the learning environment the use of posters, models and videos may be added. Patients will review and discuss the Pritikin label reading guidelines presented in Pritikin's Label Reading Educational series video. Using fool labels brought in from local grocery stores and markets, patients will apply the label reading guidelines and determine if the packaged food meet the Pritikin guidelines. The purpose of this lesson is to provide patients with the opportunity to review, discuss, and practice hands-on learning of the Pritikin Label Reading guidelines with actual packaged food labels. Cooking School  Pritikin's [employer] are designed to teach patients ways to prepare quick, simple, and affordable recipes at home. The importance of nutrition's role in chronic disease risk reduction is reflected in its emphasis in the overall Pritikin program. By learning how to prepare essential core Pritikin Eating Plan recipes, patients will increase control over what they eat; be able to customize the  flavor of foods without the use of added salt, sugar, or fat; and improve the quality of the food they consume. By learning a set of core recipes which are easily assembled, quickly prepared, and affordable, patients are more likely to prepare more healthy foods at home. These workshops focus on convenient breakfasts, simple entres, side dishes, and desserts which can be prepared with minimal effort and are consistent with nutrition recommendations for cardiovascular risk reduction. Cooking [employer] are taught by a Armed forces logistics/support/administrative officer (RD) who has been trained by the [employer]. The chef or RD has a clear understanding of the importance of minimizing - if not completely eliminating - added fat, sugar, and sodium in recipes. Throughout the series of Cooking School Workshop sessions, patients will learn about healthy ingredients and efficient methods of cooking to build confidence in their capability to prepare    Cooking School weekly topics:  Adding Flavor- Sodium-Free  Fast and Healthy Breakfasts  Powerhouse Plant-Based Proteins  Satisfying Salads and Dressings  Simple Sides and Sauces  International Cuisine-Spotlight on the [employer] Zones  Delicious Desserts  Savory Soups  [employer] - Meals in a Astronomer Appetizers and Snacks  Comforting Weekend Breakfasts  One-Pot Wonders   Fast Evening Meals  Landscape architect Your Pritikin Plate  WORKSHOPS   Healthy Mindset (Psychosocial):  Focused Goals, Sustainable Changes Clinical staff led group instruction and group discussion with PowerPoint presentation and patient guidebook. To enhance the learning environment the use of posters, models and videos may be added. Patients will be able to apply effective goal  setting strategies to establish at least one personal goal, and then take consistent, meaningful action toward that goal. They will learn to identify common barriers to achieving  personal goals and develop strategies to overcome them. Patients will also gain an understanding of how our mind-set can impact our ability to achieve goals and the importance of cultivating a positive and growth-oriented mind-set. The purpose of this lesson is to provide patients with a deeper understanding of how to set and achieve personal goals, as well as the tools and strategies needed to overcome common obstacles which may arise along the way.  From Head to Heart: The Power of a Healthy Outlook  Clinical staff led group instruction and group discussion with PowerPoint presentation and patient guidebook. To enhance the learning environment the use of posters, models and videos may be added. Patients will be able to recognize and describe the impact of emotions and mood on physical health. They will discover the importance of self-care and explore self-care practices which may work for them. Patients will also learn how to utilize the 4 C's to cultivate a healthier outlook and better manage stress and challenges. The purpose of this lesson is to demonstrate to patients how a healthy outlook is an essential part of maintaining good health, especially as they continue their cardiac rehab journey.  Healthy Sleep for a Healthy Heart Clinical staff led group instruction and group discussion with PowerPoint presentation and patient guidebook. To enhance the learning environment the use of posters, models and videos may be added. At the conclusion of this workshop, patients will be able to demonstrate knowledge of the importance of sleep to overall health, well-being, and quality of life. They will understand the symptoms of, and treatments for, common sleep disorders. Patients will also be able to identify daytime and nighttime behaviors which impact sleep, and they will be able to apply these tools to help manage sleep-related challenges. The purpose of this lesson is to provide patients with a general  overview of sleep and outline the importance of quality sleep. Patients will learn about a few of the most common sleep disorders. Patients will also be introduced to the concept of "sleep hygiene," and discover ways to self-manage certain sleeping problems through simple daily behavior changes. Finally, the workshop will motivate patients by clarifying the links between quality sleep and their goals of heart-healthy living.   Recognizing and Reducing Stress Clinical staff led group instruction and group discussion with PowerPoint presentation and patient guidebook. To enhance the learning environment the use of posters, models and videos may be added. At the conclusion of this workshop, patients will be able to understand the types of stress reactions, differentiate between acute and chronic stress, and recognize the impact that chronic stress has on their health. They will also be able to apply different coping mechanisms, such as reframing negative self-talk. Patients will have the opportunity to practice a variety of stress management techniques, such as deep abdominal breathing, progressive muscle relaxation, and/or guided imagery.  The purpose of this lesson is to educate patients on the role of stress in their lives and to provide healthy techniques for coping with it.  Learning Barriers/Preferences:  Learning Barriers/Preferences - 02/13/23 1328       Learning Barriers/Preferences   Learning Barriers Sight    Learning Preferences Video;Skilled Demonstration;Individual Instruction;Group Instruction;Computer/Internet;Pictoral             Education Topics:  Knowledge Questionnaire Score:  Knowledge Questionnaire Score - 02/13/23 1331  Knowledge Questionnaire Score   Pre Score 21/24             Core Components/Risk Factors/Patient Goals at Admission:  Personal Goals and Risk Factors at Admission - 02/13/23 1328       Core Components/Risk Factors/Patient Goals on  Admission    Weight Management Yes;Obesity;Weight Loss    Intervention Weight Management: Develop a combined nutrition and exercise program designed to reach desired caloric intake, while maintaining appropriate intake of nutrient and fiber, sodium and fats, and appropriate energy expenditure required for the weight goal.;Weight Management: Provide education and appropriate resources to help participant work on and attain dietary goals.;Weight Management/Obesity: Establish reasonable short term and long term weight goals.;Obesity: Provide education and appropriate resources to help participant work on and attain dietary goals.    Admit Weight 338 lb 13.6 oz (153.7 kg)    Expected Outcomes Short Term: Continue to assess and modify interventions until short term weight is achieved;Long Term: Adherence to nutrition and physical activity/exercise program aimed toward attainment of established weight goal;Weight Loss: Understanding of general recommendations for a balanced deficit meal plan, which promotes 1-2 lb weight loss per week and includes a negative energy balance of [phone] kcal/d;Understanding recommendations for meals to include 15-35% energy as protein, 25-35% energy from fat, 35-60% energy from carbohydrates, less than 200mg  of dietary cholesterol, 20-35 gm of total fiber daily;Understanding of distribution of calorie intake throughout the day with the consumption of 4-5 meals/snacks    Improve shortness of breath with ADL's Yes    Intervention Provide education, individualized exercise plan and daily activity instruction to help decrease symptoms of SOB with activities of daily living.    Expected Outcomes Short Term: Improve cardiorespiratory fitness to achieve a reduction of symptoms when performing ADLs    Heart Failure Yes    Intervention Provide a combined exercise and nutrition program that is supplemented with education, support and counseling about heart failure. Directed toward relieving  symptoms such as shortness of breath, decreased exercise tolerance, and extremity edema.    Expected Outcomes Improve functional capacity of life;Short term: Attendance in program 2-3 days a week with increased exercise capacity. Reported lower sodium intake. Reported increased fruit and vegetable intake. Reports medication compliance.;Short term: Daily weights obtained and reported for increase. Utilizing diuretic protocols set by physician.;Long term: Adoption of self-care skills and reduction of barriers for early signs and symptoms recognition and intervention leading to self-care maintenance.    Hypertension Yes    Intervention Provide education on lifestyle modifcations including regular physical activity/exercise, weight management, moderate sodium restriction and increased consumption of fresh fruit, vegetables, and low fat dairy, alcohol moderation, and smoking cessation.;Monitor prescription use compliance.    Expected Outcomes Short Term: Continued assessment and intervention until BP is < 140/91mm HG in hypertensive participants. < 130/41mm HG in hypertensive participants with diabetes, heart failure or chronic kidney disease.;Long Term: Maintenance of blood pressure at goal levels.    Lipids Yes    Intervention Provide education and support for participant on nutrition & aerobic/resistive exercise along with prescribed medications to achieve LDL 70mg , HDL >40mg .    Expected Outcomes Short Term: Participant states understanding of desired cholesterol values and is compliant with medications prescribed. Participant is following exercise prescription and nutrition guidelines.;Long Term: Cholesterol controlled with medications as prescribed, with individualized exercise RX and with personalized nutrition plan. Value goals: LDL < 70mg , HDL > 40 mg.             Core Components/Risk Factors/Patient  Goals Review:   Goals and Risk Factor Review     Row Name 02/19/23 1428 03/14/23 1720            Core Components/Risk Factors/Patient Goals Review   Personal Goals Review Weight Management/Obesity;Improve shortness of breath with ADL's;Heart Failure;Hypertension;Lipids Weight Management/Obesity;Improve shortness of breath with ADL's;Heart Failure;Hypertension;Lipids      Review Tilla started cardiac rehab on 02/19/23. Naryah did well with exercise. Vital signs were stable.  Oxygen satruation were wNL. Jenique uses a mobility scooter and wore oxygen with exercise. Samiya continues to do  well with exercise for her fitness. Vital signs were stable.  Oxygen satruation have been WNL.      Expected Outcomes Lindsi will continue to participate in cardiac rehab for exercise, nutrition and lifestyle modifications Rhylie will continue to participate in cardiac rehab for exercise, nutrition and lifestyle modifications               Core Components/Risk Factors/Patient Goals at Discharge (Final Review):   Goals and Risk Factor Review - 03/14/23 1720       Core Components/Risk Factors/Patient Goals Review   Personal Goals Review Weight Management/Obesity;Improve shortness of breath with ADL's;Heart Failure;Hypertension;Lipids    Review Rebecah continues to do  well with exercise for her fitness. Vital signs were stable.  Oxygen satruation have been WNL.    Expected Outcomes Chalon will continue to participate in cardiac rehab for exercise, nutrition and lifestyle modifications             ITP Comments:  ITP Comments     Row Name 02/13/23 1227 02/19/23 1423 03/14/23 1714       ITP Comments Armanda Magic, MD:  Medical Director. Introduction to the [employer] / Intensive Cardiac Rehab.  Initial oreintation packet reviewed with the patient. 30 Day ITP Review. Kiaralee started exercise at cardiac rehab on 02/19/23. Henrene did well with exercise for her fintess level 30 Day ITP Review. Eduardo continues to have good attendance and participaiton with  exercise at cardiac rehab               Comments: See ITP comments.Thayer Headings RN BSN

## 2023-03-16 ENCOUNTER — Encounter (HOSPITAL_COMMUNITY)
Admission: RE | Admit: 2023-03-16 | Discharge: 2023-03-16 | Disposition: A | Discharge status: Home / Self Care | Payer: 59 | Source: Ambulatory Visit | Attending: Cardiology

## 2023-03-16 DIAGNOSIS — I110 Hypertensive heart disease with heart failure: Secondary | ICD-10-CM | POA: Diagnosis not present

## 2023-03-16 DIAGNOSIS — I5022 Chronic systolic (congestive) heart failure: Secondary | ICD-10-CM

## 2023-03-19 ENCOUNTER — Encounter (HOSPITAL_COMMUNITY): Admission: RE | Admit: 2023-03-19 | Payer: 59 | Source: Ambulatory Visit

## 2023-03-20 DIAGNOSIS — J449 Chronic obstructive pulmonary disease, unspecified: Secondary | ICD-10-CM | POA: Diagnosis not present

## 2023-03-21 ENCOUNTER — Encounter: Payer: Self-pay | Admitting: Student

## 2023-03-21 ENCOUNTER — Encounter (HOSPITAL_COMMUNITY)
Admission: RE | Admit: 2023-03-21 | Discharge: 2023-03-21 | Disposition: A | Discharge status: Home / Self Care | Payer: 59 | Source: Ambulatory Visit | Attending: Cardiology | Admitting: Cardiology

## 2023-03-21 ENCOUNTER — Ambulatory Visit (INDEPENDENT_AMBULATORY_CARE_PROVIDER_SITE_OTHER): Payer: 59 | Admitting: Student

## 2023-03-21 VITALS — BP 110/80 | HR 102 | Temp 98.2°F | Ht 74.0 in | Wt 350.4 lb

## 2023-03-21 DIAGNOSIS — I872 Venous insufficiency (chronic) (peripheral): Secondary | ICD-10-CM | POA: Diagnosis not present

## 2023-03-21 DIAGNOSIS — I700 Atherosclerosis of aorta: Secondary | ICD-10-CM

## 2023-03-21 DIAGNOSIS — I5020 Unspecified systolic (congestive) heart failure: Secondary | ICD-10-CM | POA: Diagnosis not present

## 2023-03-21 DIAGNOSIS — E119 Type 2 diabetes mellitus without complications: Secondary | ICD-10-CM

## 2023-03-21 DIAGNOSIS — I5022 Chronic systolic (congestive) heart failure: Secondary | ICD-10-CM

## 2023-03-21 DIAGNOSIS — E1151 Type 2 diabetes mellitus with diabetic peripheral angiopathy without gangrene: Secondary | ICD-10-CM

## 2023-03-21 DIAGNOSIS — I110 Hypertensive heart disease with heart failure: Secondary | ICD-10-CM | POA: Diagnosis not present

## 2023-03-21 MED ORDER — LOSARTAN POTASSIUM 25 MG PO TABS: 25.0000 mg | ORAL_TABLET | Freq: Every day | ORAL | 11 refills | Status: AC

## 2023-03-21 MED ORDER — GABAPENTIN 400 MG PO CAPS: 400.0000 mg | ORAL_CAPSULE | Freq: Two times a day (BID) | ORAL | 2 refills | Status: DC

## 2023-03-21 MED ORDER — WEGOVY 1 MG/0.5ML ~~LOC~~ SOAJ: 1.0000 mg | SUBCUTANEOUS | 3 refills | Status: DC

## 2023-03-21 NOTE — Assessment & Plan Note (Signed)
Bilateral leg swelling. Prior history of venoplasty and stenting of the left, iliac and external iliac veins in [place].  She is reporting worsening leg edema today.  No evidence of DVT. She has been compliant with Lasix dose, endorses enough diuresis.  Her weight today is 350 pounds, unsure what her dry weight is. Shortness of breath has improved secondary to the cardiac intensive rehab rehab.Not endorsing PND orthopnea. On exam, she has bilateral leg edema with skin changes.  No JVD elevation or crackles on lung exam.  Bilateral leg swelling secondary to chronic venous insufficiency.   Will put in an order for Unna boots, however that needs to come off after 1 week.  Unfortunately clinic will be closed for the holidays.  Will put in an order for home health RN, unclear if insurance will cover.  P:  -Unna boots on bilateral leg -Home health RN order placed

## 2023-03-21 NOTE — Progress Notes (Addendum)
Bilateral Unna Boots were applied.

## 2023-03-21 NOTE — Progress Notes (Signed)
CC: bilateral leg swelling  HPI:  [name] is a 72 y.o. female living with a history stated below and presents today for bilateral leg swelling and medication refill. Please see problem based assessment and plan for additional details.  Past Medical History:  Diagnosis Date   A-fib (HCC)    Acute hypoxemic respiratory failure (HCC) 02/20/2022   Acute on chronic hypoxic respiratory failure (HCC) 01/04/2022   Acute upper respiratory infection 01/08/2022   Anxiety    Arthritis    "qwhre; joints, back" (04/17/2017)   Atrial fibrillation (HCC)    Benign breast cyst in female, left 01/08/2017   Found by Screening mammogram, evaluated by U/S on 01/08/17 and determined to be a benign simple breast cyst.   Breast cancer (HCC)    Cellulitis of left lower leg 05/30/2017   CHF (congestive heart failure) (HCC)    Chronic low back pain 08/21/2016   Chronic lower back pain    Chronic venous insufficiency    /notes 05/30/2017   COPD (chronic obstructive pulmonary disease) (HCC)    Depression    Diabetes (HCC) 01/08/2022   Diabetes mellitus without complication (HCC)    DVT (deep venous thrombosis) (HCC) 11/16/2016   Dysrhythmia    Esophageal dysmotility 11/10/2019   Previous workup by ENT with fiberoptic leryngoscopy which was normal. Dx with laryngeal pharyngeal reflux.   GERD (gastroesophageal reflux disease)    Headache    "weekly for the last 3 months" (04/17/2017)   History of pulmonary embolism    Pulmonary embolism   Hyperlipidemia    Hypertension    Laryngopharyngeal reflux 12/18/2015   Morbid obesity (HCC)    PE (pulmonary embolism)    Pulmonary embolism (HCC) 09/21/2014   Sleep apnea    Stroke (HCC) 12/02/2021    Current Outpatient Medications on File Prior to Visit  Medication Sig Dispense Refill   albuterol (VENTOLIN HFA) 108 (90 Base) MCG/ACT inhaler Inhale 2 puffs into the lungs every 4 (four) hours as needed for shortness of breath. 18 g 1   alendronate  (FOSAMAX) 70 MG tablet Take 70 mg by mouth once a week. Take with a full glass of water on an empty stomach, Patient hasn't started medication.     aspirin EC 81 MG tablet Take 81 mg by mouth daily.     atorvastatin (LIPITOR) 80 MG tablet Take 1 tablet (80 mg total) by mouth daily. 100 tablet 2   empagliflozin (JARDIANCE) 10 MG TABS tablet Take 1 tablet (10 mg total) by mouth daily before breakfast. 30 tablet 2   esomeprazole (NEXIUM) 40 MG capsule Take 1 capsule (40 mg total) by mouth daily as needed (for heartburn or indigestion). (Patient taking differently: Take 40 mg by mouth daily.) 90 capsule 1   Fluticasone-Umeclidin-Vilant (TRELEGY ELLIPTA) 100-62.5-25 MCG/ACT AEPB Inhale 1 puff into the lungs daily. 60 each 5   gabapentin (NEURONTIN) 400 MG capsule Take 1 capsule (400 mg total) by mouth 2 (two) times daily. 60 capsule 2   letrozole (FEMARA) 2.5 MG tablet TAKE 1 TABLET(2.5 MG) BY MOUTH DAILY (Patient taking differently: Take 2.5 mg by mouth daily. TAKE 1 TABLET(2.5 MG) BY MOUTH DAILY) 90 tablet 3   LORazepam (ATIVAN) 0.5 MG tablet Take 0.5 mg by mouth daily as needed for anxiety.     losartan (COZAAR) 25 MG tablet Take 1 tablet (25 mg total) by mouth daily. (Patient taking differently: Take 25 mg by mouth daily. Taking 12.5 mg if BP low) 30 tablet 11  metoprolol succinate (TOPROL-XL) 25 MG 24 hr tablet Take 1 tablet (25 mg total) by mouth daily. 100 tablet 2   naloxone (NARCAN) nasal spray 4 mg/0.1 mL Place 1 spray into the nose once as needed (for overdose suspected). for opioid overdose     oxybutynin (DITROPAN) 5 MG tablet TAKE ONE TABLET BY MOUTH THREE TIMES DAILY 90 tablet 1   oxyCODONE (ROXICODONE) 15 MG immediate release tablet Take 15 mg by mouth See admin instructions. Take 15mg  (1 tablet) by mouth every morning and 15mg  (1 tablet) at bedtime in addition to twice during the day as needed.     OXYGEN Inhale 2-3 L into the lungs daily.     potassium chloride SA (KLOR-CON M) 20 MEQ  tablet Take 2 tablets (40 mEq total) by mouth 2 (two) times daily. 90 tablet 3   PRESCRIPTION MEDICATION Inhale into the lungs at bedtime. cpap     rivaroxaban (XARELTO) 20 MG TABS tablet Take 1 tablet (20 mg total) by mouth every morning. 90 tablet 3   Semaglutide-Weight Management (WEGOVY) 1 MG/0.5ML SOAJ Inject 1 mg into the skin once a week. 2 mL 3   spironolactone (ALDACTONE) 25 MG tablet Take 1 tablet (25 mg total) by mouth daily. 90 tablet 3   torsemide (DEMADEX) 20 MG tablet Take 3 tablets (60 mg total) by mouth daily. 90 tablet 3   No current facility-administered medications on file prior to visit.     Review of Systems: ROS negative except for what is noted on the assessment and plan.  Vitals:   03/21/23 1103  BP: 110/80  Pulse: (!) 102  Temp: 98.2 F (36.8 C)  TempSrc: Oral  SpO2: 97%  Weight: (!) 350 lb 6.4 oz (158.9 kg)  Height: 6\' 2"  (1.88 m)    Physical Exam: Constitutional: Well appearing, not in acute distress. Cardiovascular: regular rate and rhythm, no m/r/g. No JVD elevation. Pulmonary/Chest: normal work of breathing on room air, lungs clear to auscultation bilaterally Skin: Bilateral leg edema with skin changes to LE.   Assessment & Plan:   Chronic venous insufficiency Bilateral leg swelling. Prior history of venoplasty and stenting of the left, iliac and external iliac veins in [place].  She is reporting worsening leg edema today.  No evidence of DVT. She has been compliant with Lasix dose, endorses enough diuresis.  Her weight today is 350 pounds, unsure what her dry weight is. Shortness of breath has improved secondary to the cardiac intensive rehab rehab.Not endorsing PND orthopnea. On exam, she has bilateral leg edema with skin changes.  No JVD elevation or crackles on lung exam.  Bilateral leg swelling secondary to chronic venous insufficiency.   Will put in an order for Unna boots, however that needs to come off after 1 week.  Unfortunately clinic  will be closed for the holidays.  Will put in an order for home health RN, unclear if insurance will cover.  P:  -Unna boots on bilateral leg -Home health RN order placed  Heart failure with reduced ejection fraction [facility]) She was recently seen in the advanced heart failure care clinic.  NYHA III confounded by deconditioning and obesity. Repeat echo shows improved EF, 55 to 60%.  Tolerated cardiac rehab, advanced heart failure clinic want patient to be seen by general cardiology given improvement in her EF and symptoms.  -Will check BMP.   Patient seen with Dr. Lowry Ram, MD [facility] Internal Medicine, PGY-1 Phone: [phone] Date 03/21/2023 Time 4:33  PM

## 2023-03-21 NOTE — Assessment & Plan Note (Signed)
She was recently seen in the advanced heart failure care clinic.  NYHA III confounded by deconditioning and obesity. Repeat echo shows improved EF, 55 to 60%.  Tolerated cardiac rehab, advanced heart failure clinic want patient to be seen by general cardiology given improvement in her EF and symptoms.  -Will check BMP.

## 2023-03-21 NOTE — Patient Instructions (Signed)
Thank you, Ms.[name] for allowing [place] to provide your care today.   Today we discussed :  1.For the leg swelling, I have placed an unna boots to be placed on. I will look to see , if home health will be available to help unwrap it since the clinic is closed next week.   2.I will send medication refills to the pharmacy.     I have ordered the following labs for you:  Lab Orders         Basic metabolic panel           I have ordered the following medication/changed the following medications:   Stop the following medications: There are no discontinued medications.   Start the following medications: No orders of the defined types were placed in this encounter.    Follow up: 3 months     Should you have any questions or concerns please call the internal medicine clinic at [phone].    Laretta Bolster, MD  [facility]

## 2023-03-22 DIAGNOSIS — J9601 Acute respiratory failure with hypoxia: Secondary | ICD-10-CM | POA: Diagnosis not present

## 2023-03-22 DIAGNOSIS — G4733 Obstructive sleep apnea (adult) (pediatric): Secondary | ICD-10-CM | POA: Diagnosis not present

## 2023-03-22 DIAGNOSIS — R2681 Unsteadiness on feet: Secondary | ICD-10-CM | POA: Diagnosis not present

## 2023-03-22 DIAGNOSIS — J9611 Chronic respiratory failure with hypoxia: Secondary | ICD-10-CM | POA: Diagnosis not present

## 2023-03-22 DIAGNOSIS — M170 Bilateral primary osteoarthritis of knee: Secondary | ICD-10-CM | POA: Diagnosis not present

## 2023-03-22 LAB — BASIC METABOLIC PANEL
BUN/Creatinine Ratio: 15 (ref 12–28)
BUN: 15 mg/dL (ref 8–27)
CO2: 22 mmol/L (ref 20–29)
Calcium: 9.2 mg/dL (ref 8.7–10.3)
Chloride: 104 mmol/L (ref 96–106)
Creatinine, Ser: 1 mg/dL (ref 0.57–1.00)
Glucose: 94 mg/dL (ref 70–99)
Potassium: 4.1 mmol/L (ref 3.5–5.2)
Sodium: 141 mmol/L (ref 134–144)
eGFR: 59 mL/min/{1.73_m2} — ABNORMAL LOW (ref >59)

## 2023-03-23 ENCOUNTER — Encounter (HOSPITAL_COMMUNITY)
Admission: RE | Admit: 2023-03-23 | Discharge: 2023-03-23 | Disposition: A | Discharge status: Home / Self Care | Payer: 59 | Source: Ambulatory Visit | Attending: Cardiology | Admitting: Cardiology

## 2023-03-23 DIAGNOSIS — I10 Essential (primary) hypertension: Secondary | ICD-10-CM | POA: Diagnosis not present

## 2023-03-23 DIAGNOSIS — G8929 Other chronic pain: Secondary | ICD-10-CM | POA: Diagnosis not present

## 2023-03-23 DIAGNOSIS — E119 Type 2 diabetes mellitus without complications: Secondary | ICD-10-CM | POA: Diagnosis not present

## 2023-03-23 DIAGNOSIS — R030 Elevated blood-pressure reading, without diagnosis of hypertension: Secondary | ICD-10-CM | POA: Diagnosis not present

## 2023-03-23 DIAGNOSIS — I5022 Chronic systolic (congestive) heart failure: Secondary | ICD-10-CM | POA: Diagnosis not present

## 2023-03-23 DIAGNOSIS — Z79899 Other long term (current) drug therapy: Secondary | ICD-10-CM | POA: Diagnosis not present

## 2023-03-23 DIAGNOSIS — M25561 Pain in right knee: Secondary | ICD-10-CM | POA: Diagnosis not present

## 2023-03-23 DIAGNOSIS — C50911 Malignant neoplasm of unspecified site of right female breast: Secondary | ICD-10-CM | POA: Diagnosis not present

## 2023-03-23 DIAGNOSIS — M25562 Pain in left knee: Secondary | ICD-10-CM | POA: Diagnosis not present

## 2023-03-23 DIAGNOSIS — J449 Chronic obstructive pulmonary disease, unspecified: Secondary | ICD-10-CM | POA: Diagnosis not present

## 2023-03-23 DIAGNOSIS — I110 Hypertensive heart disease with heart failure: Secondary | ICD-10-CM | POA: Diagnosis not present

## 2023-03-23 DIAGNOSIS — J9610 Chronic respiratory failure, unspecified whether with hypoxia or hypercapnia: Secondary | ICD-10-CM | POA: Diagnosis not present

## 2023-03-26 ENCOUNTER — Encounter (HOSPITAL_COMMUNITY)
Admission: RE | Admit: 2023-03-26 | Discharge: 2023-03-26 | Disposition: A | Discharge status: Home / Self Care | Payer: 59 | Source: Ambulatory Visit | Attending: Cardiology

## 2023-03-26 DIAGNOSIS — I110 Hypertensive heart disease with heart failure: Secondary | ICD-10-CM | POA: Diagnosis not present

## 2023-03-26 DIAGNOSIS — I5022 Chronic systolic (congestive) heart failure: Secondary | ICD-10-CM | POA: Diagnosis not present

## 2023-03-29 ENCOUNTER — Ambulatory Visit: Payer: Self-pay

## 2023-03-29 NOTE — Patient Instructions (Signed)
Visit Information  Thank you for taking time to visit with me today. Please don't hesitate to contact me if I can be of assistance to you.   Following are the goals we discussed today:   Goals Addressed             This Visit's Progress    I want to monitor and control my conditions       Patient Goals/Self Care Activities: -Patient/Caregiver will take medications as prescribed   -Patient/Caregiver will attend all scheduled provider appointments -Patient/Caregiver will call pharmacy for medication refills 3-7 days in advance of running out of medications -Patient/Caregiver will call provider office for new concerns or questions  -Patient/Caregiver will focus on medication adherence by taking medications as prescribed  -Weigh daily and record (notify MD with 3 lb weight gain over night or 5 lb in a week) -Follow CHF Action Plan -Adhere to low sodium diet  Wt Readings from Last 3 Encounters:  03/21/23 (!) 350 lb 6.4 oz (158.9 kg)  02/13/23 (!) 338 lb 13.6 oz (153.7 kg)  12/07/22 (!) 350 lb (158.8 kg)   -Keep your airway clear from mucus build up  -Use a humidifier, if needed -Take your fluid pill as prescribed and monitor your diet -Self assess COPD action plan zone and make appointment with provider if you have been in the yellow zone for 48 hours without improvement.  -continue yourwork with cardiac rehab         Our next appointment is by telephone on 05/01/23 at 930  am  Please call the care guide team at [phone] if you need to cancel or reschedule your appointment.   If you are experiencing a Mental Health or Behavioral Health Crisis or need someone to talk to, please call 1-800-273-TALK (toll free, 24 hour hotline)  Patient verbalizes understanding of instructions and care plan provided today and agrees to view in MyChart. Active MyChart status and patient understanding of how to access instructions and care plan via MyChart confirmed with patient.     Juanell Fairly RN, BSN, [facility] [place]  [facility], [facility]  Care Coordinator Phone: [phone]

## 2023-03-29 NOTE — Patient Outreach (Signed)
  Care Coordination   Follow Up Visit Note   03/29/2023 Name: Sharon Vasquez [name]: [id number] DOB: [birth year]  Sharon Vasquez is a 72 y.o. year old female who sees Atway, Rayann N, DO for primary care. I spoke with  Sharon Vasquez by phone today.  What matters to the patients health and wellness today?  Sharon Vasquez reports that she is currently in satisfactory condition, yet she expresses concern regarding the status of her transportation application. She requires access to locations beyond her doctor's office, specifically for grocery shopping and other errands.  During her last medical appointment on December 18, her blood pressure readings were within normal limits; however, she reported experiencing some leg swelling. The physician prescribed Una boots for this condition, yet Sharon Vasquez indicated that she had removed the Una boots due to them drying out. Additionally, home health nursing support has been arranged. Sharon Vasquez will continue her participation in cardiac rehabilitation and is scheduled for a follow-up appointment on December 27. Although she denies experiencing any chest pain, she does report some shortness of breath, especially when retaining fluid, which she is managing with her prescribed fluid medication. I advised her to adhere to her medication regimen and to closely monitor her dietary intake.    Goals Addressed             This Visit's Progress    I want to monitor and control my conditions       Patient Goals/Self Care Activities: -Patient/Caregiver will take medications as prescribed   -Patient/Caregiver will attend all scheduled provider appointments -Patient/Caregiver will call pharmacy for medication refills 3-7 days in advance of running out of medications -Patient/Caregiver will call provider office for new concerns or questions  -Patient/Caregiver will focus on medication adherence by taking medications as prescribed  -Weigh daily and record (notify MD with 3  lb weight gain over night or 5 lb in a week) -Follow CHF Action Plan -Adhere to low sodium diet  Wt Readings from Last 3 Encounters:  03/21/23 (!) 350 lb 6.4 oz (158.9 kg)  02/13/23 (!) 338 lb 13.6 oz (153.7 kg)  12/07/22 (!) 350 lb (158.8 kg)   -Keep your airway clear from mucus build up  -Use a humidifier, if needed -Take your fluid pill as prescribed and monitor your diet -Self assess COPD action plan zone and make appointment with provider if you have been in the yellow zone for 48 hours without improvement.  -continue yourwork with cardiac rehab         SDOH assessments and interventions completed:  No     Care Coordination Interventions:  Yes, provided   Interventions Today    Flowsheet Row Most Recent Value  Chronic Disease   Chronic disease during today's visit Congestive Heart Failure (CHF)  General Interventions   General Interventions Discussed/Reviewed General Interventions Discussed, General Interventions Reviewed  Nutrition Interventions   Nutrition Discussed/Reviewed Nutrition Discussed  Pharmacy Interventions   Pharmacy Dicussed/Reviewed Pharmacy Topics Discussed  Safety Interventions   Safety Discussed/Reviewed Safety Discussed        Follow up plan: Follow up call scheduled for 05/01/23  930  am    Encounter Outcome:  Patient Visit Completed   Juanell Fairly RN, BSN, [facility] [place]  [facility], [facility]  Care Coordinator Phone: [phone]

## 2023-03-30 ENCOUNTER — Encounter: Payer: Self-pay | Admitting: Internal Medicine

## 2023-03-30 ENCOUNTER — Encounter (HOSPITAL_COMMUNITY)
Admission: RE | Admit: 2023-03-30 | Discharge: 2023-03-30 | Disposition: A | Discharge status: Home / Self Care | Payer: 59 | Source: Ambulatory Visit | Attending: Cardiology

## 2023-03-30 ENCOUNTER — Encounter: Payer: 59 | Admitting: Internal Medicine

## 2023-03-30 DIAGNOSIS — I5022 Chronic systolic (congestive) heart failure: Secondary | ICD-10-CM | POA: Diagnosis not present

## 2023-03-30 DIAGNOSIS — I110 Hypertensive heart disease with heart failure: Secondary | ICD-10-CM | POA: Diagnosis not present

## 2023-03-30 DIAGNOSIS — Z79899 Other long term (current) drug therapy: Secondary | ICD-10-CM | POA: Diagnosis not present

## 2023-03-30 NOTE — Progress Notes (Signed)
Internal Medicine Clinic Attending  I was physically present during the key portions of the resident provided service and participated in the medical decision making of patient's management care. I reviewed pertinent patient test results.  The assessment, diagnosis, and plan were formulated together and I agree with the documentation in the resident's note. Compression and elevation are mainstays of therapy.  Dr. Edilia Bo, vascular surg, had a nice description of his conversation with her about this in 11/2020.    Miguel Aschoff, MD

## 2023-03-30 NOTE — Progress Notes (Deleted)
CC: ***  HPI:  [name] is a 72 y.o. female living with a history stated below and presents today for ***. Please see problem based assessment and plan for additional details.  Past Medical History:  Diagnosis Date   A-fib (HCC)    Acute hypoxemic respiratory failure (HCC) 02/20/2022   Acute on chronic hypoxic respiratory failure (HCC) 01/04/2022   Acute upper respiratory infection 01/08/2022   Anxiety    Arthritis    "qwhre; joints, back" (04/17/2017)   Atrial fibrillation (HCC)    Benign breast cyst in female, left 01/08/2017   Found by Screening mammogram, evaluated by U/S on 01/08/17 and determined to be a benign simple breast cyst.   Breast cancer (HCC)    Cellulitis of left lower leg 05/30/2017   CHF (congestive heart failure) (HCC)    Chronic low back pain 08/21/2016   Chronic lower back pain    Chronic venous insufficiency    /notes 05/30/2017   COPD (chronic obstructive pulmonary disease) (HCC)    Depression    Diabetes (HCC) 01/08/2022   Diabetes mellitus without complication (HCC)    DVT (deep venous thrombosis) (HCC) 11/16/2016   Dysrhythmia    Esophageal dysmotility 11/10/2019   Previous workup by ENT with fiberoptic leryngoscopy which was normal. Dx with laryngeal pharyngeal reflux.   GERD (gastroesophageal reflux disease)    Headache    "weekly for the last 3 months" (04/17/2017)   History of pulmonary embolism    Pulmonary embolism   Hyperlipidemia    Hypertension    Laryngopharyngeal reflux 12/18/2015   Morbid obesity (HCC)    PE (pulmonary embolism)    Pulmonary embolism (HCC) 09/21/2014   Sleep apnea    Stroke (HCC) 12/02/2021    Current Outpatient Medications on File Prior to Visit  Medication Sig Dispense Refill   albuterol (VENTOLIN HFA) 108 (90 Base) MCG/ACT inhaler Inhale 2 puffs into the lungs every 4 (four) hours as needed for shortness of breath. 18 g 1   alendronate (FOSAMAX) 70 MG tablet Take 70 mg by mouth once a week. Take  with a full glass of water on an empty stomach, Patient hasn't started medication.     aspirin EC 81 MG tablet Take 81 mg by mouth daily.     atorvastatin (LIPITOR) 80 MG tablet Take 1 tablet (80 mg total) by mouth daily. 100 tablet 2   empagliflozin (JARDIANCE) 10 MG TABS tablet Take 1 tablet (10 mg total) by mouth daily before breakfast. 30 tablet 2   esomeprazole (NEXIUM) 40 MG capsule Take 1 capsule (40 mg total) by mouth daily as needed (for heartburn or indigestion). (Patient taking differently: Take 40 mg by mouth daily.) 90 capsule 1   Fluticasone-Umeclidin-Vilant (TRELEGY ELLIPTA) 100-62.5-25 MCG/ACT AEPB Inhale 1 puff into the lungs daily. 60 each 5   gabapentin (NEURONTIN) 400 MG capsule Take 1 capsule (400 mg total) by mouth 2 (two) times daily. 60 capsule 2   letrozole (FEMARA) 2.5 MG tablet TAKE 1 TABLET(2.5 MG) BY MOUTH DAILY (Patient taking differently: Take 2.5 mg by mouth daily. TAKE 1 TABLET(2.5 MG) BY MOUTH DAILY) 90 tablet 3   LORazepam (ATIVAN) 0.5 MG tablet Take 0.5 mg by mouth daily as needed for anxiety.     losartan (COZAAR) 25 MG tablet Take 1 tablet (25 mg total) by mouth daily. 30 tablet 11   metoprolol succinate (TOPROL-XL) 25 MG 24 hr tablet Take 1 tablet (25 mg total) by mouth daily. 100 tablet 2  naloxone (NARCAN) nasal spray 4 mg/0.1 mL Place 1 spray into the nose once as needed (for overdose suspected). for opioid overdose     oxybutynin (DITROPAN) 5 MG tablet TAKE ONE TABLET BY MOUTH THREE TIMES DAILY 90 tablet 1   oxyCODONE (ROXICODONE) 15 MG immediate release tablet Take 15 mg by mouth See admin instructions. Take 15mg  (1 tablet) by mouth every morning and 15mg  (1 tablet) at bedtime in addition to twice during the day as needed.     OXYGEN Inhale 2-3 L into the lungs daily.     potassium chloride SA (KLOR-CON M) 20 MEQ tablet Take 2 tablets (40 mEq total) by mouth 2 (two) times daily. 90 tablet 3   PRESCRIPTION MEDICATION Inhale into the lungs at bedtime. cpap      rivaroxaban (XARELTO) 20 MG TABS tablet Take 1 tablet (20 mg total) by mouth every morning. 90 tablet 3   Semaglutide-Weight Management (WEGOVY) 1 MG/0.5ML SOAJ Inject 1 mg into the skin once a week. 2 mL 3   spironolactone (ALDACTONE) 25 MG tablet Take 1 tablet (25 mg total) by mouth daily. 90 tablet 3   torsemide (DEMADEX) 20 MG tablet Take 3 tablets (60 mg total) by mouth daily. 90 tablet 3   No current facility-administered medications on file prior to visit.    Family History  Problem Relation Age of Onset   Breast cancer Mother        before age 16   Hypertension Mother    Hyperlipidemia Mother    Hypertension Maternal Grandfather    Hyperlipidemia Maternal Grandfather     Social History   Socioeconomic History   Marital status: Divorced    Spouse name: Not on file   Number of children: Not on file   Years of education: Not on file   Highest education level: Not on file  Occupational History   Not on file  Tobacco Use   Smoking status: Former    Current packs/day: 0.00    Average packs/day: 2.0 packs/day for 60.4 years (120.8 ttl pk-yrs)    Types: Cigarettes    Start date: 08/16/1961    Quit date: 01/02/2022    Years since quitting: 1.2   Smokeless tobacco: Never   Tobacco comments:    Pt smokes about 4 ciggs daily. Stopped 01/02/2022   Vaping Use   Vaping status: Never Used  Substance and Sexual Activity   Alcohol use: No   Drug use: No   Sexual activity: Not Currently    Partners: Male  Other Topics Concern   Not on file  Social History Narrative   Lives in [facility] [facility] in [place]. Lives alone, but is dependent in ADLs/IADLs. Previously had HH PT and RN but dismissed them with plans to use the [employer]. Two daughters live nearby. Previously resided in [place].   Social Drivers of Health   Financial Resource Strain: Medium Risk (11/16/2022)   Overall Financial Resource Strain (CARDIA)    Difficulty of Paying Living Expenses: Somewhat hard  Food  Insecurity: No Food Insecurity (12/01/2022)   Hunger Vital Sign    Worried About Running Out of Food in the Last Year: Never true    Ran Out of Food in the Last Year: Never true  Transportation Needs: No Transportation Needs (12/01/2022)   PRAPARE - Administrator, Civil Service (Medical): No    Lack of Transportation (Non-Medical): No  Physical Activity: Inactive (11/16/2022)   Exercise Vital Sign  Days of Exercise per Week: 0 days    Minutes of Exercise per Session: 0 min  Stress: No Stress Concern Present (12/02/2021)   [employer] of Occupational Health - Occupational Stress Questionnaire    Feeling of Stress : Only a little  Social Connections: Moderately Integrated (03/15/2022)   Social Connection and Isolation Panel [NHANES]    Frequency of Communication with Friends and Family: More than three times a week    Frequency of Social Gatherings with Friends and Family: More than three times a week    Attends Religious Services: More than 4 times per year    Active Member of [employer] or Organizations: Yes    Attends Engineer, structural: More than 4 times per year    Marital Status: Divorced  Intimate Partner Violence: Not At Risk (12/01/2022)   Humiliation, Afraid, Rape, and Kick questionnaire    Fear of Current or Ex-Partner: No    Emotionally Abused: No    Physically Abused: No    Sexually Abused: No    Review of Systems: ROS negative except for what is noted on the assessment and plan.  There were no vitals filed for this visit.  Physical Exam: Constitutional: well-appearing *** sitting in ***, in no acute distress HENT: normocephalic atraumatic, mucous membranes moist Eyes: conjunctiva non-erythematous Cardiovascular: regular rate and rhythm, no m/r/g Pulmonary/Chest: normal work of breathing on room air, lungs clear to auscultation bilaterally Abdominal: soft, non-tender, non-distended MSK: normal bulk and tone Neurological: alert & oriented x 3,  no focal deficit Skin: warm and dry Psych: normal mood and behavior  Assessment & Plan:   Chronic venous insufficiency  - leg edema, despite lasix - unna boots self-removed  HFrecEF/HTN: 55-60%  Did [facility] rehab - metop 25 - losartan 25 - jardiance 10 - spiro 25 - toresemide 60  Afib - xarelto   Chronic resp failure , COPD on 2 L  Patient {GC/GE:[id number]::"discussed with","seen with"} Dr. {[id number]:[id number]::"NFAOZHYQ","M. Hoffman","Mullen","Narendra","Vincent","Guilloud","Lau","Machen"}  No problem-specific Assessment & Plan notes found for this encounter.   Elza Rafter, D.O. [facility] Internal Medicine, PGY-3 Phone: [phone] Date 03/30/2023 Time 6:55 AM

## 2023-04-02 ENCOUNTER — Encounter (HOSPITAL_COMMUNITY)
Admission: RE | Admit: 2023-04-02 | Discharge: 2023-04-02 | Disposition: A | Discharge status: Home / Self Care | Payer: 59 | Source: Ambulatory Visit | Attending: Cardiology

## 2023-04-02 DIAGNOSIS — I5022 Chronic systolic (congestive) heart failure: Secondary | ICD-10-CM

## 2023-04-02 DIAGNOSIS — I110 Hypertensive heart disease with heart failure: Secondary | ICD-10-CM | POA: Diagnosis not present

## 2023-04-05 ENCOUNTER — Encounter (HOSPITAL_BASED_OUTPATIENT_CLINIC_OR_DEPARTMENT_OTHER): Payer: 59 | Admitting: Internal Medicine

## 2023-04-06 ENCOUNTER — Encounter (HOSPITAL_COMMUNITY): Payer: 59

## 2023-04-06 ENCOUNTER — Telehealth: Payer: Self-pay

## 2023-04-06 NOTE — Telephone Encounter (Signed)
 Prior Authorization for patient Sharon Vasquez 1MG /0.5ML auto-injectors) came through on cover my meds was submitted with last office notes awaiting approval or denial.  JYN:[id number]

## 2023-04-09 ENCOUNTER — Encounter (HOSPITAL_COMMUNITY): Payer: 59

## 2023-04-09 NOTE — Telephone Encounter (Signed)
 Decision:Approved  Reena Moats (Key: [id number]) Wegovy  1MG /0.5ML auto-injectors Form [employer] Medicare Part D Electronic Prior Authorization Form (2017 NCPDP) Your prior authorization for Wegovy  has been approved! More Info Personalized support and financial assistance may be available through the [employer] program. For more information, and to see program requirements, click on the More Info button to the right.  Message from plan: Request Reference Number: [id number]. WEGOVY  INJ 1MG  is approved through 04/02/2024. Your patient may now fill this prescription and it will be covered.. Authorization Expiration Date: April 02, 2024.

## 2023-04-11 ENCOUNTER — Encounter (HOSPITAL_COMMUNITY)
Admission: RE | Admit: 2023-04-11 | Discharge: 2023-04-11 | Disposition: A | Discharge status: Home / Self Care | Payer: 59 | Source: Ambulatory Visit | Attending: Cardiology | Admitting: Cardiology

## 2023-04-11 DIAGNOSIS — I5022 Chronic systolic (congestive) heart failure: Secondary | ICD-10-CM | POA: Diagnosis not present

## 2023-04-11 DIAGNOSIS — Z48812 Encounter for surgical aftercare following surgery on the circulatory system: Secondary | ICD-10-CM | POA: Diagnosis not present

## 2023-04-13 ENCOUNTER — Telehealth (HOSPITAL_COMMUNITY): Payer: Self-pay

## 2023-04-13 ENCOUNTER — Encounter (HOSPITAL_COMMUNITY): Payer: 59

## 2023-04-13 NOTE — Telephone Encounter (Signed)
 Left VM for patient, no classes offered today after 12:30 due to impending weather

## 2023-04-16 ENCOUNTER — Encounter (HOSPITAL_COMMUNITY)
Admission: RE | Admit: 2023-04-16 | Discharge: 2023-04-16 | Disposition: A | Discharge status: Home / Self Care | Payer: 59 | Source: Ambulatory Visit | Attending: Cardiology | Admitting: Cardiology

## 2023-04-16 DIAGNOSIS — I5022 Chronic systolic (congestive) heart failure: Secondary | ICD-10-CM

## 2023-04-16 DIAGNOSIS — Z48812 Encounter for surgical aftercare following surgery on the circulatory system: Secondary | ICD-10-CM | POA: Diagnosis not present

## 2023-04-18 ENCOUNTER — Encounter (HOSPITAL_COMMUNITY)
Admission: RE | Admit: 2023-04-18 | Discharge: 2023-04-18 | Disposition: A | Discharge status: Home / Self Care | Payer: 59 | Source: Ambulatory Visit | Attending: Cardiology

## 2023-04-18 DIAGNOSIS — Z48812 Encounter for surgical aftercare following surgery on the circulatory system: Secondary | ICD-10-CM | POA: Diagnosis not present

## 2023-04-18 DIAGNOSIS — I5022 Chronic systolic (congestive) heart failure: Secondary | ICD-10-CM | POA: Diagnosis not present

## 2023-04-18 NOTE — Progress Notes (Signed)
 Reviewed home exercise Rx with patient today.  Encouraged warm-up, cool-down, and stretching. Reviewed THRR of 59-118 and keeping RPE between 11-13. Encouraged to hydrate with activity.  Reviewed weather parameters for temperature and humidity for safe exercise outdoors. Reviewed S/S to terminate exercise and when to call 911 vs MD. Pt encouraged to always carry a cell phone for safety when exercising outdoors. Pt verbalized understanding of the home exercise Rx and was provided a copy.   Rosezena Contes MS, ACSM-CEP, CCRP

## 2023-04-20 ENCOUNTER — Encounter (HOSPITAL_COMMUNITY)
Admission: RE | Admit: 2023-04-20 | Discharge: 2023-04-20 | Disposition: A | Discharge status: Home / Self Care | Payer: 59 | Source: Ambulatory Visit | Attending: Cardiology | Admitting: Cardiology

## 2023-04-20 DIAGNOSIS — J449 Chronic obstructive pulmonary disease, unspecified: Secondary | ICD-10-CM | POA: Diagnosis not present

## 2023-04-22 DIAGNOSIS — R2681 Unsteadiness on feet: Secondary | ICD-10-CM | POA: Diagnosis not present

## 2023-04-22 DIAGNOSIS — J9601 Acute respiratory failure with hypoxia: Secondary | ICD-10-CM | POA: Diagnosis not present

## 2023-04-22 DIAGNOSIS — G4733 Obstructive sleep apnea (adult) (pediatric): Secondary | ICD-10-CM | POA: Diagnosis not present

## 2023-04-22 DIAGNOSIS — M170 Bilateral primary osteoarthritis of knee: Secondary | ICD-10-CM | POA: Diagnosis not present

## 2023-04-22 DIAGNOSIS — J9611 Chronic respiratory failure with hypoxia: Secondary | ICD-10-CM | POA: Diagnosis not present

## 2023-04-23 ENCOUNTER — Encounter (HOSPITAL_COMMUNITY)
Admission: RE | Admit: 2023-04-23 | Discharge: 2023-04-23 | Disposition: A | Discharge status: Home / Self Care | Payer: 59 | Source: Ambulatory Visit | Attending: Cardiology

## 2023-04-23 DIAGNOSIS — I5022 Chronic systolic (congestive) heart failure: Secondary | ICD-10-CM | POA: Diagnosis not present

## 2023-04-23 DIAGNOSIS — Z48812 Encounter for surgical aftercare following surgery on the circulatory system: Secondary | ICD-10-CM | POA: Diagnosis not present

## 2023-04-24 DIAGNOSIS — E119 Type 2 diabetes mellitus without complications: Secondary | ICD-10-CM | POA: Diagnosis not present

## 2023-04-24 DIAGNOSIS — M25561 Pain in right knee: Secondary | ICD-10-CM | POA: Diagnosis not present

## 2023-04-24 DIAGNOSIS — C50911 Malignant neoplasm of unspecified site of right female breast: Secondary | ICD-10-CM | POA: Diagnosis not present

## 2023-04-24 DIAGNOSIS — Z0000 Encounter for general adult medical examination without abnormal findings: Secondary | ICD-10-CM | POA: Diagnosis not present

## 2023-04-24 DIAGNOSIS — J439 Emphysema, unspecified: Secondary | ICD-10-CM | POA: Diagnosis not present

## 2023-04-24 DIAGNOSIS — I509 Heart failure, unspecified: Secondary | ICD-10-CM | POA: Diagnosis not present

## 2023-04-24 DIAGNOSIS — M25562 Pain in left knee: Secondary | ICD-10-CM | POA: Diagnosis not present

## 2023-04-24 DIAGNOSIS — G8929 Other chronic pain: Secondary | ICD-10-CM | POA: Diagnosis not present

## 2023-04-24 DIAGNOSIS — J9610 Chronic respiratory failure, unspecified whether with hypoxia or hypercapnia: Secondary | ICD-10-CM | POA: Diagnosis not present

## 2023-04-24 DIAGNOSIS — J449 Chronic obstructive pulmonary disease, unspecified: Secondary | ICD-10-CM | POA: Diagnosis not present

## 2023-04-24 DIAGNOSIS — Z79899 Other long term (current) drug therapy: Secondary | ICD-10-CM | POA: Diagnosis not present

## 2023-04-25 ENCOUNTER — Telehealth: Payer: Self-pay | Admitting: *Deleted

## 2023-04-25 ENCOUNTER — Encounter (HOSPITAL_COMMUNITY)
Admission: RE | Admit: 2023-04-25 | Discharge: 2023-04-25 | Disposition: A | Discharge status: Home / Self Care | Payer: 59 | Source: Ambulatory Visit | Attending: Cardiology

## 2023-04-25 DIAGNOSIS — I5022 Chronic systolic (congestive) heart failure: Secondary | ICD-10-CM

## 2023-04-25 DIAGNOSIS — Z48812 Encounter for surgical aftercare following surgery on the circulatory system: Secondary | ICD-10-CM | POA: Diagnosis not present

## 2023-04-25 NOTE — Progress Notes (Signed)
Sharon Vasquez reports that she is retaining fluid in her legs. Weight today 353.2 lbs, 160.2 kg. Upon assessment lung fields essentially clear, slightly diminished in the bases. Oxygen saturation 100% on 2l/min. Sharon Vasquez exercises on oxygen. Chronic bilateral lower extremity leg and pedal edema. Sharon Vasquez says she has not yet taken her diuretic today. The heart failure clinic was called and notified about the patient's concerns. Sharon Vasquez says she is going home to take her diuretic after exercising at cardiac rehab. Will continue to monitor the patient throughout  the program.Sharon Mink [name] RN BSN

## 2023-04-25 NOTE — Progress Notes (Signed)
Complex Care Management Care Guide Note  04/25/2023 Name: Sharon Vasquez [name]: [id number] DOB: [birth year]  Sharon Vasquez is a 73 y.o. year old female who is a primary care patient of [name], DO and is actively engaged with the care management team. I reached out to [name] by phone today to assist with re-scheduling  with the RN Case Manager.  Follow up plan: Unsuccessful telephone outreach attempt made.  Gwenevere Ghazi  [facility]  [facility] Guide  Direct Dial: [phone]  Fax [phone]

## 2023-04-27 ENCOUNTER — Encounter (HOSPITAL_COMMUNITY)
Admission: RE | Admit: 2023-04-27 | Discharge: 2023-04-27 | Disposition: A | Discharge status: Home / Self Care | Payer: 59 | Source: Ambulatory Visit | Attending: Cardiology | Admitting: Cardiology

## 2023-04-27 DIAGNOSIS — I5022 Chronic systolic (congestive) heart failure: Secondary | ICD-10-CM

## 2023-04-27 DIAGNOSIS — Z79899 Other long term (current) drug therapy: Secondary | ICD-10-CM | POA: Diagnosis not present

## 2023-04-27 DIAGNOSIS — Z48812 Encounter for surgical aftercare following surgery on the circulatory system: Secondary | ICD-10-CM | POA: Diagnosis not present

## 2023-04-30 ENCOUNTER — Encounter (HOSPITAL_COMMUNITY)
Admission: RE | Admit: 2023-04-30 | Discharge: 2023-04-30 | Disposition: A | Discharge status: Home / Self Care | Payer: 59 | Source: Ambulatory Visit | Attending: Cardiology | Admitting: Cardiology

## 2023-04-30 DIAGNOSIS — Z48812 Encounter for surgical aftercare following surgery on the circulatory system: Secondary | ICD-10-CM | POA: Diagnosis not present

## 2023-04-30 DIAGNOSIS — I5022 Chronic systolic (congestive) heart failure: Secondary | ICD-10-CM

## 2023-05-01 ENCOUNTER — Ambulatory Visit (HOSPITAL_BASED_OUTPATIENT_CLINIC_OR_DEPARTMENT_OTHER): Payer: 59 | Attending: Primary Care | Admitting: Internal Medicine

## 2023-05-01 VITALS — Ht 74.0 in | Wt 352.0 lb

## 2023-05-01 DIAGNOSIS — J9611 Chronic respiratory failure with hypoxia: Secondary | ICD-10-CM | POA: Insufficient documentation

## 2023-05-01 DIAGNOSIS — G4733 Obstructive sleep apnea (adult) (pediatric): Secondary | ICD-10-CM | POA: Diagnosis not present

## 2023-05-01 NOTE — Progress Notes (Signed)
Complex Care Management Care Guide Note  05/01/2023 Name: AMANDALYNN PITZ [name]: [id number] DOB: 05/08/1950  ATHZIRY MILLICAN is a 73 y.o. year old female who is a primary care patient of [name], DO and is actively engaged with the care management team. I reached out to [name] by phone today to assist with re-scheduling  with the RN Case Manager.  Follow up plan: Telephone appointment with complex care management team member scheduled for:  2/25  Gwenevere Ghazi  [facility]  [facility] Guide  Direct Dial: [phone]  Fax [phone]

## 2023-05-02 ENCOUNTER — Encounter (HOSPITAL_COMMUNITY): Payer: 59

## 2023-05-04 ENCOUNTER — Encounter (HOSPITAL_COMMUNITY)
Admission: RE | Admit: 2023-05-04 | Discharge: 2023-05-04 | Disposition: A | Discharge status: Home / Self Care | Payer: 59 | Source: Ambulatory Visit | Attending: Cardiology

## 2023-05-04 DIAGNOSIS — Z48812 Encounter for surgical aftercare following surgery on the circulatory system: Secondary | ICD-10-CM | POA: Diagnosis not present

## 2023-05-04 DIAGNOSIS — I5022 Chronic systolic (congestive) heart failure: Secondary | ICD-10-CM | POA: Diagnosis not present

## 2023-05-04 NOTE — Progress Notes (Signed)
Cardiac Individual Treatment Plan  Patient Details  Name: Sharon Vasquez [name]: [id number] Date of Birth: [birth year] Referring Provider:   Flowsheet Row INTENSIVE CARDIAC REHAB ORIENT from 02/13/2023 in [facility]  Referring Provider [name], MD       Initial Encounter Date:  Flowsheet Row INTENSIVE CARDIAC REHAB ORIENT from 02/13/2023 in [facility] for Heart, Vascular, & Lung Health  Date 02/13/23       Visit Diagnosis: Heart failure, chronic systolic (HCC)  Patient's Home Medications on Admission:  Current Outpatient Medications:    albuterol (VENTOLIN HFA) 108 (90 Base) MCG/ACT inhaler, Inhale 2 puffs into the lungs every 4 (four) hours as needed for shortness of breath., Disp: 18 g, Rfl: 1   alendronate (FOSAMAX) 70 MG tablet, Take 70 mg by mouth once a week. Take with a full glass of water on an empty stomach, Patient hasn't started medication., Disp: , Rfl:    aspirin EC 81 MG tablet, Take 81 mg by mouth daily., Disp: , Rfl:    atorvastatin (LIPITOR) 80 MG tablet, Take 1 tablet (80 mg total) by mouth daily., Disp: 100 tablet, Rfl: 2   empagliflozin (JARDIANCE) 10 MG TABS tablet, Take 1 tablet (10 mg total) by mouth daily before breakfast., Disp: 30 tablet, Rfl: 2   esomeprazole (NEXIUM) 40 MG capsule, Take 1 capsule (40 mg total) by mouth daily as needed (for heartburn or indigestion). (Patient taking differently: Take 40 mg by mouth daily.), Disp: 90 capsule, Rfl: 1   Fluticasone-Umeclidin-Vilant (TRELEGY ELLIPTA) 100-62.5-25 MCG/ACT AEPB, Inhale 1 puff into the lungs daily., Disp: 60 each, Rfl: 5   gabapentin (NEURONTIN) 400 MG capsule, Take 1 capsule (400 mg total) by mouth 2 (two) times daily., Disp: 60 capsule, Rfl: 2   letrozole (FEMARA) 2.5 MG tablet, TAKE 1 TABLET(2.5 MG) BY MOUTH DAILY (Patient taking differently: Take 2.5 mg by mouth daily. TAKE 1 TABLET(2.5 MG) BY MOUTH DAILY),  Disp: 90 tablet, Rfl: 3   LORazepam (ATIVAN) 0.5 MG tablet, Take 0.5 mg by mouth daily as needed for anxiety., Disp: , Rfl:    losartan (COZAAR) 25 MG tablet, Take 1 tablet (25 mg total) by mouth daily., Disp: 30 tablet, Rfl: 11   metoprolol succinate (TOPROL-XL) 25 MG 24 hr tablet, Take 1 tablet (25 mg total) by mouth daily., Disp: 100 tablet, Rfl: 2   naloxone (NARCAN) nasal spray 4 mg/0.1 mL, Place 1 spray into the nose once as needed (for overdose suspected). for opioid overdose, Disp: , Rfl:    oxybutynin (DITROPAN) 5 MG tablet, TAKE ONE TABLET BY MOUTH THREE TIMES DAILY, Disp: 90 tablet, Rfl: 1   oxyCODONE (ROXICODONE) 15 MG immediate release tablet, Take 15 mg by mouth See admin instructions. Take 15mg  (1 tablet) by mouth every morning and 15mg  (1 tablet) at bedtime in addition to twice during the day as needed., Disp: , Rfl:    OXYGEN, Inhale 2-3 L into the lungs daily., Disp: , Rfl:    potassium chloride SA (KLOR-CON M) 20 MEQ tablet, Take 2 tablets (40 mEq total) by mouth 2 (two) times daily., Disp: 90 tablet, Rfl: 3   PRESCRIPTION MEDICATION, Inhale into the lungs at bedtime. cpap, Disp: , Rfl:    rivaroxaban (XARELTO) 20 MG TABS tablet, Take 1 tablet (20 mg total) by mouth every morning., Disp: 90 tablet, Rfl: 3   Semaglutide-Weight Management (WEGOVY) 1 MG/0.5ML SOAJ, Inject 1 mg into the skin once  a week., Disp: 2 mL, Rfl: 3   spironolactone (ALDACTONE) 25 MG tablet, Take 1 tablet (25 mg total) by mouth daily., Disp: 90 tablet, Rfl: 3   torsemide (DEMADEX) 20 MG tablet, Take 3 tablets (60 mg total) by mouth daily., Disp: 90 tablet, Rfl: 3  Past Medical History: Past Medical History:  Diagnosis Date   A-fib (HCC)    Acute hypoxemic respiratory failure (HCC) 02/20/2022   Acute on chronic hypoxic respiratory failure (HCC) 01/04/2022   Acute upper respiratory infection 01/08/2022   Anxiety    Arthritis    "qwhre; joints, back" (04/17/2017)   Atrial fibrillation (HCC)    Benign  breast cyst in female, left 01/08/2017   Found by Screening mammogram, evaluated by [facility] on 01/08/17 and determined to be a benign simple breast cyst.   Breast cancer (HCC)    Cellulitis of left lower leg 05/30/2017   CHF (congestive heart failure) (HCC)    Chronic low back pain 08/21/2016   Chronic lower back pain    Chronic venous insufficiency    /notes 05/30/2017   COPD (chronic obstructive pulmonary disease) (HCC)    Depression    Diabetes (HCC) 01/08/2022   Diabetes mellitus without complication (HCC)    DVT (deep venous thrombosis) (HCC) 11/16/2016   Dysrhythmia    Esophageal dysmotility 11/10/2019   Previous workup by ENT with fiberoptic leryngoscopy which was normal. Dx with laryngeal pharyngeal reflux.   GERD (gastroesophageal reflux disease)    Headache    "weekly for the last 3 months" (04/17/2017)   History of pulmonary embolism    Pulmonary embolism   Hyperlipidemia    Hypertension    Laryngopharyngeal reflux 12/18/2015   Morbid obesity (HCC)    PE (pulmonary embolism)    Pulmonary embolism (HCC) 09/21/2014   Right foot pain 03/03/2021   Sleep apnea    Stroke (HCC) 12/02/2021    Tobacco Use: Social History   Tobacco Use  Smoking Status Former   Current packs/day: 0.00   Average packs/day: 2.0 packs/day for 60.4 years (120.8 ttl pk-yrs)   Types: Cigarettes   Start date: 08/16/1961   Quit date: 01/02/2022   Years since quitting: 1.3  Smokeless Tobacco Never  Tobacco Comments   Pt smokes about 4 ciggs daily. Stopped 01/02/2022     Labs: Review Flowsheet  More data exists      Latest Ref Rng & Units 04/27/2022 10/28/2022 10/29/2022 10/30/2022 11/01/2022  Labs for ITP Cardiac and Pulmonary Rehab  Trlycerides <150 mg/dL - - 528  - -  Hemoglobin A1c 4.8 - 5.6 % 5.6  - 5.8  - -  PH, Arterial 7.35 - 7.45 - 7.173  7.327  - 7.403  7.420   PCO2 arterial 32 - 48 mmHg - 63.2  40.5  - 50.8  46.2   Bicarbonate 20.0 - 28.0 mmol/L 20.0 - 28.0 mmol/L - 23.2  21.2  - 31.7   31.9  30.0   TCO2 22 - 32 mmol/L 22 - 32 mmol/L - 25  22  22   - 33  33  31   Acid-base deficit 0.0 - 2.0 mmol/L - 6.0  4.0  - -  O2 Saturation % % - 91  99  75.4  71  68  96     Details       Multiple values from one day are sorted in reverse-chronological order         Capillary Blood Glucose: Lab Results  Component Value Date  GLUCAP 200 (H) 12/02/2022   GLUCAP 102 (H) 11/06/2022   GLUCAP 107 (H) 11/06/2022   GLUCAP 124 (H) 11/06/2022   GLUCAP 115 (H) 11/05/2022     Exercise Target Goals: Exercise Program Goal: Individual exercise prescription set using results from initial 6 min walk test and THRR while considering  patient's activity barriers and safety.   Exercise Prescription Goal: Initial exercise prescription builds to 30-45 minutes a day of aerobic activity, 2-3 days per week.  Home exercise guidelines will be given to patient during program as part of exercise prescription that the participant will acknowledge.  Activity Barriers & Risk Stratification:  Activity Barriers & Cardiac Risk Stratification - 02/13/23 1320       Activity Barriers & Cardiac Risk Stratification   Activity Barriers Balance Concerns;Arthritis;Joint Problems;Back Problems;Deconditioning;Assistive Device;Muscular Weakness;Shortness of Breath;Neck/Spine Problems;Other (comment)    Comments Pt does not walk, uses mobility chair    Cardiac Risk Stratification High             6 Minute Walk:  6 Minute Walk     Row Name 02/13/23 1317         6 Minute Walk   Phase Initial  NUSTEP TEST     Distance 1017 feet  NUSTEP     Walk Time 6 minutes     # of Rest Breaks 0     MPH 1.93     METS 1     RPE 11     Perceived Dyspnea  0     VO2 Peak 3.2     Symptoms Yes (comment)     Comments Bilateral knee pain 9/10     Resting HR 86 bpm     Resting BP 110/70     Resting Oxygen Saturation  93 %     Exercise Oxygen Saturation  during 6 min walk 100 %     Max Ex. HR 96 bpm     Max Ex.  BP 100/60     2 Minute Post BP 104/70       Interval HR   1 Minute HR 93     2 Minute HR 93     3 Minute HR 85     4 Minute HR 86     5 Minute HR 84     6 Minute HR 94     Interval Heart Rate? Yes       Interval Oxygen   Interval Oxygen? Yes     Baseline Oxygen Saturation % 93 %     1 Minute Oxygen Saturation % 96 %     1 Minute Liters of Oxygen 2 L     2 Minute Oxygen Saturation % 96 %     2 Minute Liters of Oxygen 2 L     3 Minute Oxygen Saturation % 98 %     3 Minute Liters of Oxygen 2 L     4 Minute Oxygen Saturation % 98 %     4 Minute Liters of Oxygen 2 L     5 Minute Oxygen Saturation % 100 %     5 Minute Liters of Oxygen 2 L     6 Minute Oxygen Saturation % 100 %     6 Minute Liters of Oxygen 2 L              Oxygen Initial Assessment:   Oxygen Re-Evaluation:   Oxygen Discharge (Final Oxygen Re-Evaluation):   Initial Exercise Prescription:  Initial Exercise  Prescription - 02/13/23 1300       Date of Initial Exercise RX and Referring Provider   Date 02/13/23    Referring Provider [name], MD    Expected Discharge Date 05/08/22      T5 Nustep   Level 1    SPM 60    Minutes 20    METs 1      Prescription Details   Frequency (times per week) 3    Duration Progress to 30 minutes of continuous aerobic without signs/symptoms of physical distress      Intensity   THRR 40-80% of Max Heartrate 59-118    Ratings of Perceived Exertion 11-13    Perceived Dyspnea 0-4      Progression   Progression Continue progressive overload as per policy without signs/symptoms or physical distress.      Resistance Training   Training Prescription Yes    Weight 2 lbs    Reps 10-15             Perform Capillary Blood Glucose checks as needed.  Exercise Prescription Changes:   Exercise Prescription Changes     Row Name 02/19/23 1400 03/09/23 1400 03/26/23 1600 04/16/23 1500 04/18/23 1400     Response to Exercise   Blood Pressure (Admit)  120/78 108/80 114/64 118/74 112/70   Blood Pressure (Exercise) 124/66 122/70 -- -- --   Blood Pressure (Exit) 98/70 102/70 104/72 110/70 108/66   Heart Rate (Admit) 102 bpm 81 bpm 76 bpm 100 bpm 87 bpm   Heart Rate (Exercise) 117 bpm 111 bpm 103 bpm 109 bpm 113 bpm   Heart Rate (Exit) 109 bpm 89 bpm 84 bpm 88 bpm 92 bpm   Oxygen Saturation (Exercise) 96 %  O2 @ 2L/min via n/c 99 %  2 L/min via N/C 96 % -- --   Rating of Perceived Exertion (Exercise) 13 12 13 12 13    Symptoms None None None None None   Comments Pt's first day in the CRP2 program Reviewed METs Reviewed METs and goals Reviewed METs Reviewed HERx   Duration Continue with 30 min of aerobic exercise without signs/symptoms of physical distress. Progress to 30 minutes of  aerobic without signs/symptoms of physical distress Continue with 30 min of aerobic exercise without signs/symptoms of physical distress. Continue with 30 min of aerobic exercise without signs/symptoms of physical distress. Continue with 30 min of aerobic exercise without signs/symptoms of physical distress.   Intensity THRR unchanged THRR unchanged THRR unchanged THRR unchanged THRR unchanged     Progression   Progression Continue to progress workloads to maintain intensity without signs/symptoms of physical distress. Continue to progress workloads to maintain intensity without signs/symptoms of physical distress. Continue to progress workloads to maintain intensity without signs/symptoms of physical distress. Continue to progress workloads to maintain intensity without signs/symptoms of physical distress. Continue to progress workloads to maintain intensity without signs/symptoms of physical distress.   Average METs 1.7 1.7 1.7 -- 1.7     Resistance Training   Training Prescription Yes Yes Yes Yes No   Weight 2 lbs 2 lbs 2 lbs 2 lbs No weights on wednesdays   Reps 10-15 10-15 10-15 10-15 --   Time 10 Minutes 10 Minutes 10 Minutes 10 Minutes --     Interval Training    Interval Training No No No No No     T5 Nustep   Level 1 1 1 1 1    SPM 64 61 117 114 99   Minutes 20  25 30 30 30    METs 1.6 1.7 1.7 2 1.7     Home Exercise Plan   Plans to continue exercise at -- -- -- -- Home (comment)   Frequency -- -- -- -- Add 2 additional days to program exercise sessions.   Initial Home Exercises Provided -- -- -- -- 04/18/23    Row Name 04/27/23 1500             Response to Exercise   Blood Pressure (Admit) 110/72       Blood Pressure (Exit) 112/68       Heart Rate (Admit) 71 bpm       Heart Rate (Exercise) 98 bpm       Heart Rate (Exit) 80 bpm       Rating of Perceived Exertion (Exercise) 12       Symptoms None       Comments Reviewed METs and goals       Duration Continue with 30 min of aerobic exercise without signs/symptoms of physical distress.       Intensity THRR unchanged         Progression   Progression Continue to progress workloads to maintain intensity without signs/symptoms of physical distress.       Average METs 1.8         Resistance Training   Training Prescription Yes       Weight 2 lbs       Reps 10-15       Time 10 Minutes         Interval Training   Interval Training No         Oxygen   Oxygen Continuous       Liters 2         T5 Nustep   Level 1       SPM 101       Minutes 30       METs 1.8         Home Exercise Plan   Plans to continue exercise at Home (comment)       Frequency Add 2 additional days to program exercise sessions.       Initial Home Exercises Provided 04/18/23                Exercise Comments:   Exercise Comments     Row Name 02/19/23 1444 03/09/23 1433 03/26/23 1611 04/16/23 1547 04/18/23 1418   Exercise Comments Pt uses mobility chair. Pt is non-ambualtory. Warm-up , cool-down and weight as done in chair. No complaints with todays session. Reviewed METs. Pt progress is slow. Mets range between 1.7 and 1.8 with exercise. Pt's METs level is stagnant. However, pt has been able to  increase the duration on the Nustep to 30 minutes. Pt is limited by her mobility. Pt voices that recently her bed collapsed and she was able to get herself up to her mobility chair. Pt cedits her exercise here in the program for making that possible. Reviewed METs. Pt is making slow progress. Pt is limited by her legs at times. Reviewed home exercise Rx. Pt is non-ambualtory, so we dicussed chair exercises for her to begin at home in addtion to her CRP2 program. Pt was given handouts for links to Youtube chair exercises as well as resosurces for the [employer] and Rec programs on TV. Pt was also given infromation for the BOOST program. Pt verbalized understanding of the home exercise Rx and was provided a  copy.    Row Name 04/27/23 1500           Exercise Comments Reviewed METs and goals. Pt is making slow progress. She does report that her daily life and ADL's ahve improved due tot he exercise. Pt remains limited on the Nustep due to her right knee put puts forth good effort each session.                Exercise Goals and Review:   Exercise Goals     Row Name 02/13/23 1321             Exercise Goals   Increase Physical Activity Yes       Intervention Provide advice, education, support and counseling about physical activity/exercise needs.;Develop an individualized exercise prescription for aerobic and resistive training based on initial evaluation findings, risk stratification, comorbidities and participant's personal goals.       Expected Outcomes Short Term: Attend rehab on a regular basis to increase amount of physical activity.;Long Term: Exercising regularly at least 3-5 days a week.;Long Term: Add in home exercise to make exercise part of routine and to increase amount of physical activity.       Increase Strength and Stamina Yes       Intervention Provide advice, education, support and counseling about physical activity/exercise needs.;Develop an individualized  exercise prescription for aerobic and resistive training based on initial evaluation findings, risk stratification, comorbidities and participant's personal goals.       Expected Outcomes Short Term: Increase workloads from initial exercise prescription for resistance, speed, and METs.;Short Term: Perform resistance training exercises routinely during rehab and add in resistance training at home;Long Term: Improve cardiorespiratory fitness, muscular endurance and strength as measured by increased METs and functional capacity ( )       Able to understand and use rate of perceived exertion (RPE) scale Yes       Intervention Provide education and explanation on how to use RPE scale       Expected Outcomes Short Term: Able to use RPE daily in rehab to express subjective intensity level;Long Term:  Able to use RPE to guide intensity level when exercising independently       Knowledge and understanding of Target Heart Rate Range (THRR) Yes       Intervention Provide education and explanation of THRR including how the numbers were predicted and where they are located for reference       Expected Outcomes Short Term: Able to state/look up THRR;Long Term: Able to use THRR to govern intensity when exercising independently;Short Term: Able to use daily as guideline for intensity in rehab       Understanding of Exercise Prescription Yes       Intervention Provide education, explanation, and written materials on patient's individual exercise prescription       Expected Outcomes Short Term: Able to explain program exercise prescription;Long Term: Able to explain home exercise prescription to exercise independently                Exercise Goals Re-Evaluation :  Exercise Goals Re-Evaluation     Row Name 02/19/23 1441 03/26/23 1611 04/27/23 1500         Exercise Goal Re-Evaluation   Exercise Goals Review Increase Physical Activity;Understanding of Exercise Prescription;Increase Strength and Stamina;Able  to understand and use Dyspnea scale;Knowledge and understanding of Target Heart Rate Range (THRR);Able to understand and use rate of perceived exertion (RPE) scale Increase Physical Activity;Understanding of Exercise Prescription;Increase Strength and  Stamina;Able to understand and use Dyspnea scale;Knowledge and understanding of Target Heart Rate Range (THRR);Able to understand and use rate of perceived exertion (RPE) scale Increase Physical Activity;Understanding of Exercise Prescription;Increase Strength and Stamina;Able to understand and use Dyspnea scale;Knowledge and understanding of Target Heart Rate Range (THRR);Able to understand and use rate of perceived exertion (RPE) scale     Comments Pt's first day in the CRP2 program. Pt understnads exercise RX, THRR and RPE scale. Reviewed METs and goals Reviewed METs and goals. Ailen progress on her goals of getting stronger, having more energy, and less SOB. She voices that she is able to stand longer at the kitchen sink which is very improtant to her. She is also able to stand longer in other situations as well. She has less SOB with her ADL's. She also voices that she doesn't panic anymore about her SOB.     Expected Outcomes Will continue to montior patient and progress exercise workloads as tolerated. Will continue to montior patient and progress exercise workloads as tolerated. Will continue to montior patient and progress exercise workloads as tolerated.              Discharge Exercise Prescription (Final Exercise Prescription Changes):  Exercise Prescription Changes - 04/27/23 1500       Response to Exercise   Blood Pressure (Admit) 110/72    Blood Pressure (Exit) 112/68    Heart Rate (Admit) 71 bpm    Heart Rate (Exercise) 98 bpm    Heart Rate (Exit) 80 bpm    Rating of Perceived Exertion (Exercise) 12    Symptoms None    Comments Reviewed METs and goals    Duration Continue with 30 min of aerobic exercise without signs/symptoms of  physical distress.    Intensity THRR unchanged      Progression   Progression Continue to progress workloads to maintain intensity without signs/symptoms of physical distress.    Average METs 1.8      Resistance Training   Training Prescription Yes    Weight 2 lbs    Reps 10-15    Time 10 Minutes      Interval Training   Interval Training No      Oxygen   Oxygen Continuous    Liters 2      T5 Nustep   Level 1    SPM 101    Minutes 30    METs 1.8      Home Exercise Plan   Plans to continue exercise at Home (comment)    Frequency Add 2 additional days to program exercise sessions.    Initial Home Exercises Provided 04/18/23             Nutrition:  Target Goals: Understanding of nutrition guidelines, daily intake of sodium 1500mg , cholesterol 200mg , calories 30% from fat and 7% or less from saturated fats, daily to have 5 or more servings of fruits and vegetables.  Biometrics:   Post Biometrics - 02/13/23 1110        Post  Biometrics   Waist Circumference --   Pt cannot stand to measure   Hip Circumference --   Pt cannot stand to measure   Triceps Skinfold 45 mm    % Body Fat --   unable to calculate   Grip Strength 28 kg    Flexibility --   Not perfomed due to mobility   Single Leg Stand --   Pt cannot stand unassisted  Nutrition Therapy Plan and Nutrition Goals:  Nutrition Therapy & Goals - 02/19/23 1335       Nutrition Therapy   Diet Heart Healthy Diet    Drug/Food Interactions Statins/Certain Fruits      Personal Nutrition Goals   Nutrition Goal Patient to identify strategies for reducing cardiovascular risk by attending the [employer] education and nutrition series weekly.    Personal Goal #2 Patient to improve diet quality by using the plate method as a guide for meal planning to include lean protein/plant protein, fruits, vegetables, whole grains, nonfat dairy as part of a well-balanced diet.    Personal Goal #3 Patient to reduce  sodium intake to 1500mg  per day.    Personal Goal #4 Patient to identify strategies for weight loss of 0.5-2.0# per week.    Comments Dajanae has medical history of HFrEF, A-fib, chronic respiratory failure, OSA, pre-diabetes, hyperlipidemia, COPD, peripheral vascular disease. She started [facility] ~August 2024. She does report some concerns of constipation. She reports making some dietary changes including increased high fiber foods and reading food labels for sodium. Her A1c remains in a pre-diabetic range. No recent lipid panel; but historically controlled from 2023. Patient will benefit from participation in intensive cardiac rehab for nutrition, exercise, and lifestyle modification.      Intervention Plan   Intervention Prescribe, educate and counsel regarding individualized specific dietary modifications aiming towards targeted core components such as weight, hypertension, lipid management, diabetes, heart failure and other comorbidities.;Nutrition handout(s) given to patient.    Expected Outcomes Short Term Goal: Understand basic principles of dietary content, such as calories, fat, sodium, cholesterol and nutrients.;Long Term Goal: Adherence to prescribed nutrition plan.             Nutrition Assessments:  Nutrition Assessments - 02/28/23 1553       Rate Your Plate Scores   Pre Score 34            MEDIFICTS Score Key: >=70 Need to make dietary changes  40-70 Heart Healthy Diet <= 40 Therapeutic Level Cholesterol Diet   Flowsheet Row INTENSIVE CARDIAC REHAB from 03/05/2023 in [facility] for Heart, Vascular, & Lung Health  Picture Your Plate Total Score on Admission 34      Picture Your Plate Scores: <40 Unhealthy dietary pattern with much room for improvement. 41-50 Dietary pattern unlikely to meet recommendations for good health and room for improvement. 51-60 More healthful dietary pattern, with some room for improvement.  >60 Healthy dietary  pattern, although there may be some specific behaviors that could be improved.    Nutrition Goals Re-Evaluation:  Nutrition Goals Re-Evaluation     Row Name 02/19/23 1335             Goals   Current Weight 338 lb 13.6 oz (153.7 kg)       Comment lipoprotein A 227, A1c 5.8; other most recent labs lipids 08/2021: LDL 46, HDL 26       Expected Outcome Shondra has medical history of HFrEF, A-fib, chronic respiratory failure, OSA, pre-diabetes, hyperlipidemia, COPD, peripheral vascular disease. She started [facility] ~August 2024. She does report some concerns of constipation. She reports making some dietary changes including increased high fiber foods and reading food labels for sodium. Her A1c remains in a pre-diabetic range. No recent lipid panel; but historically controlled from 2023. Patient will benefit from participation in intensive cardiac rehab for nutrition, exercise, and lifestyle modification.  Nutrition Goals Re-Evaluation:  Nutrition Goals Re-Evaluation     Row Name 02/19/23 1335             Goals   Current Weight 338 lb 13.6 oz (153.7 kg)       Comment lipoprotein A 227, A1c 5.8; other most recent labs lipids 08/2021: LDL 46, HDL 26       Expected Outcome Vickee has medical history of HFrEF, A-fib, chronic respiratory failure, OSA, pre-diabetes, hyperlipidemia, COPD, peripheral vascular disease. She started [facility] ~August 2024. She does report some concerns of constipation. She reports making some dietary changes including increased high fiber foods and reading food labels for sodium. Her A1c remains in a pre-diabetic range. No recent lipid panel; but historically controlled from 2023. Patient will benefit from participation in intensive cardiac rehab for nutrition, exercise, and lifestyle modification.                Nutrition Goals Discharge (Final Nutrition Goals Re-Evaluation):  Nutrition Goals Re-Evaluation - 02/19/23 1335       Goals   Current  Weight 338 lb 13.6 oz (153.7 kg)    Comment lipoprotein A 227, A1c 5.8; other most recent labs lipids 08/2021: LDL 46, HDL 26    Expected Outcome Jameson has medical history of HFrEF, A-fib, chronic respiratory failure, OSA, pre-diabetes, hyperlipidemia, COPD, peripheral vascular disease. She started [facility] ~August 2024. She does report some concerns of constipation. She reports making some dietary changes including increased high fiber foods and reading food labels for sodium. Her A1c remains in a pre-diabetic range. No recent lipid panel; but historically controlled from 2023. Patient will benefit from participation in intensive cardiac rehab for nutrition, exercise, and lifestyle modification.             Psychosocial: Target Goals: Acknowledge presence or absence of significant depression and/or stress, maximize coping skills, provide positive support system. Participant is able to verbalize types and ability to use techniques and skills needed for reducing stress and depression.  Initial Review & Psychosocial Screening:  Initial Psych Review & Screening - 02/13/23 1231       Initial Review   Current issues with --   Pt has Ativan for anxiety PRN. Pt voices that she is not depressed, more situational     Family Dynamics   Good Support System? Yes   Pt has her daughters for support     Barriers   Psychosocial barriers to participate in program There are no identifiable barriers or psychosocial needs.      Screening Interventions   Interventions Encouraged to exercise             Quality of Life Scores:  Quality of Life - 02/13/23 1309       Quality of Life   Select Quality of Life      Quality of Life Scores   Health/Function Pre 18 %    Socioeconomic Pre 23 %    Psych/Spiritual Pre 28.29 %    Family Pre 10 %    GLOBAL Pre 20.52 %            Scores of 19 and below usually indicate a poorer quality of life in these areas.  A difference of  2-3 points is a  clinically meaningful difference.  A difference of 2-3 points in the total score of the Quality of Life Index has been associated with significant improvement in overall quality of life, self-image, physical symptoms, and general health in studies assessing change  in quality of life.  PHQ-9: Review Flowsheet  More data exists      02/13/2023 12/13/2022 11/29/2022 10/25/2022 10/17/2022  Depression screen PHQ 2/9  Decreased Interest 0 0 1 0 0  Down, Depressed, Hopeless 1 0 1 1 0  PHQ - 2 Score 1 0 2 1 0  Altered sleeping 0 1 1 1  0  Tired, decreased energy 3 1 1 2 3   Change in appetite 0 1 1 0 0  Feeling bad or failure about yourself  0 - 0 1 0  Trouble concentrating 0 0 0 0 0  Moving slowly or fidgety/restless 0 0 0 0 0  Suicidal thoughts 0 0 0 0 0  PHQ-9 Score 4 3 5 5 3   Difficult doing work/chores Not difficult at all Not difficult at all Very difficult - Somewhat difficult   Interpretation of Total Score  Total Score Depression Severity:  1-4 = Minimal depression, 5-9 = Mild depression, 10-14 = Moderate depression, 15-19 = Moderately severe depression, 20-27 = Severe depression   Psychosocial Evaluation and Intervention:   Psychosocial Re-Evaluation:  Psychosocial Re-Evaluation     Row Name 02/19/23 1425 03/14/23 1715 04/10/23 0852 05/04/23 1005       Psychosocial Re-Evaluation   Current issues with Current Anxiety/Panic Current Anxiety/Panic Current Anxiety/Panic Current Anxiety/Panic    Comments Zarria did not voice any increased concerns or stressors on her first day of exercise. Will review quality of life questionnare and PHq2-9 in the upcoming week Quality of life questionnaire and PHQ2-9 reviewed on 03/07/23.t states she is feeling stressed due to her transportation issues. She states she was not approved for [facility] services. Novella states her MD's office is working on appeal paperwork. 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 She  states that her neighbors come to her with different demands, questions, concerns, and complaints. Nickie also brought up not being medication compliant with her diuretics  Sashia also endorses stress due to medical appointments. Carmella declines the need for counselling at this time. Gwenda continues to have transportation concerns at times. 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 Armelia will complete cardiac rehab on 05/09/23. Katty says that participating in cardiac rehab has been helpful for her.    Expected Outcomes Shaorn will have decreased or controlled anxiety upon completion of cardiac rehab. Shaorn will have decreased or controlled anxiety upon completion of cardiac rehab. Shaorn will have decreased or controlled anxiety upon completion of cardiac rehab. Shaorn will have decreased or controlled anxiety upon completion of cardiac rehab.    Interventions Stress management education;Encouraged to attend Cardiac Rehabilitation for the exercise;Relaxation education Stress management education;Encouraged to attend Cardiac Rehabilitation for the exercise;Relaxation education Stress management education;Encouraged to attend Cardiac Rehabilitation for the exercise;Relaxation education Stress management education;Encouraged to attend Cardiac Rehabilitation for the exercise;Relaxation education    Continue Psychosocial Services  No Follow up required Follow up required by staff Follow up required by staff Follow up required by staff             Psychosocial Discharge (Final Psychosocial Re-Evaluation):  Psychosocial Re-Evaluation - 05/04/23 1005       Psychosocial Re-Evaluation   Current issues with Current Anxiety/Panic    Comments Ebelyn will complete cardiac rehab on 05/09/23. Bailea says that participating in cardiac rehab has been helpful for her.    Expected Outcomes Shaorn will have decreased or controlled anxiety upon completion  of cardiac rehab.    Interventions Stress management education;Encouraged to attend Cardiac Rehabilitation for the exercise;Relaxation education    Continue Psychosocial Services  Follow up required by staff             Vocational Rehabilitation: Provide vocational rehab assistance to qualifying candidates.   Vocational Rehab Evaluation & Intervention:  Vocational Rehab - 02/13/23 1328       Initial Vocational Rehab Evaluation & Intervention   Assessment shows need for Vocational Rehabilitation No   Pt is retired            Education: Education Goals: Education classes will be provided on a weekly basis, covering required topics. Participant will state understanding/return demonstration of topics presented.    Education     Row Name 02/19/23 1500     Education   Cardiac Education Topics Pritikin   Geographical information systems officer Exercise   Exercise Workshop Location manager and Fall Prevention   Instruction Review Code 1- Verbalizes Understanding   Class Start Time 1406   Class Stop Time 1500   Class Time Calculation (min) 54 min    Row Name 02/21/23 1300     Education   Cardiac Education Topics Pritikin   Customer service manager   Weekly Topic Fast and Healthy Breakfasts   Instruction Review Code 1- Verbalizes Understanding   Class Start Time 1355   Class Stop Time 1435   Class Time Calculation (min) 40 min    Row Name 02/23/23 1500     Education   Cardiac Education Topics Pritikin   Licensed conveyancer Nutrition   Nutrition Overview of the Pritikin Eating Plan   Instruction Review Code 1- Verbalizes Understanding   Class Start Time 1400   Class Stop Time 1445   Class Time Calculation (min) 45 min    Row Name 02/26/23 1400     Education   Cardiac Education Topics Pritikin   Architect Exercise Physiologist   Select Psychosocial   Psychosocial Healthy Minds, Bodies, Hearts   Instruction Review Code 1- Verbalizes Understanding   Class Start Time 1357   Class Stop Time 1429   Class Time Calculation (min) 32 min    Row Name 03/05/23 1400     Education   Cardiac Education Topics Pritikin   Select Core Videos     Core Videos   Educator Exercise Physiologist   Select Nutrition   Nutrition Other  Label Reading   Instruction Review Code 1- Verbalizes Understanding   Class Start Time 1356   Class Stop Time 1430   Class Time Calculation (min) 34 min    Row Name 03/07/23 1300     Education   Cardiac Education Topics Pritikin   Customer service manager   Weekly Topic Tasty Appetizers and Snacks   Instruction Review Code 1- Verbalizes Understanding   Class Start Time 1355   Class Stop Time 1440   Class Time Calculation (min) 45 min    Row Name 03/09/23 1400     Education   Cardiac Education Topics Pritikin   Glass blower/designer Nutrition   Nutrition Workshop  Label Reading   Instruction Review Code 1- Verbalizes Understanding   Class Start Time 1401   Class Stop Time 1433   Class Time Calculation (min) 32 min    Row Name 03/12/23 1600     Education   Cardiac Education Topics Pritikin   Select Workshops     Workshops   Educator Exercise Physiologist   Select Psychosocial   Psychosocial Workshop Recognizing and Reducing Stress   Instruction Review Code 1- Verbalizes Understanding   Class Start Time 1142   Class Stop Time 1231   Class Time Calculation (min) 49 min    Row Name 03/14/23 1500     Education   Cardiac Education Topics Pritikin   Orthoptist   Educator Dietitian   Weekly Topic Efficiency Cooking - Meals in a Snap   Instruction Review Code 1- Verbalizes Understanding   Class Start Time 1143   Class Stop Time 1226    Class Time Calculation (min) 43 min    Row Name 03/16/23 1500     Education   Cardiac Education Topics Pritikin   Nurse, children's   Educator Dietitian   Select Nutrition   Nutrition Calorie Density   Instruction Review Code 1- Verbalizes Understanding   Class Start Time 1149   Class Stop Time 1227   Class Time Calculation (min) 38 min    Row Name 03/21/23 1500     Education   Cardiac Education Topics Pritikin   Orthoptist   Educator Dietitian   Weekly Topic One-Pot Wonders   Instruction Review Code 1- Verbalizes Understanding   Class Start Time 1359   Class Stop Time 1436   Class Time Calculation (min) 37 min    Row Name 03/26/23 1100     Education   Cardiac Education Topics Pritikin   Hospital doctor Education   General Education Hypertension and Heart Disease   Instruction Review Code 1- Verbalizes Understanding   Class Start Time 1147   Class Stop Time 1221   Class Time Calculation (min) 34 min    Row Name 03/30/23 1400     Education   Cardiac Education Topics Pritikin   Glass blower/designer Nutrition   Nutrition Workshop Targeting Your Nutrition Priorities   Instruction Review Code 1- Verbalizes Understanding   Class Start Time 1145   Class Stop Time 1228   Class Time Calculation (min) 43 min    Row Name 04/02/23 1300     Education   Cardiac Education Topics Pritikin   [employer]     Workshops   Educator Exercise Physiologist   Select Psychosocial   Psychosocial Workshop Focused Goals, Sustainable Changes   Instruction Review Code 1- Verbalizes Understanding   Class Start Time 1143   Class Stop Time 1222   Class Time Calculation (min) 39 min    Row Name 04/11/23 1500     Education   Cardiac Education Topics Pritikin   Cabin crew   Weekly Topic Comforting Weekend Breakfasts   Instruction Review Code 1- Verbalizes Understanding   Class Start Time 1400   Class Stop Time 1440   Class Time Calculation (min) 40 min  Row Name 04/16/23 1200     Education   Cardiac Education Topics Pritikin   Psychologist, forensic Exercise Education   Exercise Education Improving Performance   Instruction Review Code 1- Verbalizes Understanding   Class Start Time 1144   Class Stop Time 1231   Class Time Calculation (min) 47 min    Row Name 04/20/23 1300     Education   Cardiac Education Topics Pritikin   Glass blower/designer Nutrition   Nutrition Workshop Fueling a Forensic psychologist   Instruction Review Code 1- TEFL teacher Understanding   Class Start Time 1145   Class Stop Time 1228   Class Time Calculation (min) 43 min    Row Name 04/23/23 1300     Education   Cardiac Education Topics Pritikin   Geographical information systems officer Psychosocial   Psychosocial Workshop Healthy Sleep for a Healthy Heart   Instruction Review Code 1- Verbalizes Understanding   Class Start Time 1155   Class Stop Time 1245   Class Time Calculation (min) 50 min    Row Name 04/25/23 1400     Education   Cardiac Education Topics Pritikin   Customer service manager   Weekly Topic International Cuisine- Spotlight on the [employer] Zones   Instruction Review Code 1- Verbalizes Understanding   Class Start Time 1145   Class Stop Time 1230   Class Time Calculation (min) 45 min    Row Name 04/27/23 1100     Education   Cardiac Education Topics Pritikin   Select Core Videos     Core Videos   Educator Exercise Physiologist   Select Psychosocial   Psychosocial How Our Thoughts Can Heal Our Hearts   Instruction Review Code 1- Verbalizes Understanding    Class Start Time 1152   Class Stop Time 1226   Class Time Calculation (min) 34 min    Row Name 04/30/23 1500     Education   Cardiac Education Topics --   Select --     Workshops   Biomedical scientist Exercise   Exercise Workshop Managing Heart Disease: Your Path to a Healthier Heart   Instruction Review Code 1- Verbalizes Understanding   Class Start Time 1142   Class Stop Time 1238   Class Time Calculation (min) 56 min            Core Videos: Exercise    Move It!  Clinical staff conducted group or individual video education with verbal and written material and guidebook.  Patient learns the recommended Pritikin exercise program. Exercise with the goal of living a long, healthy life. Some of the health benefits of exercise include controlled diabetes, healthier blood pressure levels, improved cholesterol levels, improved heart and lung capacity, improved sleep, and better body composition. Everyone should speak with their doctor before starting or changing an exercise routine.  Biomechanical Limitations Clinical staff conducted group or individual video education with verbal and written material and guidebook.  Patient learns how biomechanical limitations can impact exercise and how we can mitigate and possibly overcome limitations to have an impactful and balanced exercise routine.  Body Composition Clinical staff conducted group or individual video education with verbal and written material and guidebook.  Patient learns  that body composition (ratio of muscle mass to fat mass) is a key component to assessing overall fitness, rather than body weight alone. Increased fat mass, especially visceral belly fat, can put [place] at increased risk for metabolic syndrome, type 2 diabetes, heart disease, and even death. It is recommended to combine diet and exercise (cardiovascular and resistance training) to improve your body composition. Seek guidance from your physician  and exercise physiologist before implementing an exercise routine.  Exercise Action Plan Clinical staff conducted group or individual video education with verbal and written material and guidebook.  Patient learns the recommended strategies to achieve and enjoy long-term exercise adherence, including variety, self-motivation, self-efficacy, and positive decision making. Benefits of exercise include fitness, good health, weight management, more energy, better sleep, less stress, and overall well-being.  Medical   Heart Disease Risk Reduction Clinical staff conducted group or individual video education with verbal and written material and guidebook.  Patient learns our heart is our most vital organ as it circulates oxygen, nutrients, white blood cells, and hormones throughout the entire body, and carries waste away. Data supports a plant-based eating plan like the Pritikin Program for its effectiveness in slowing progression of and reversing heart disease. The video provides a number of recommendations to address heart disease.   Metabolic Syndrome and Belly Fat  Clinical staff conducted group or individual video education with verbal and written material and guidebook.  Patient learns what metabolic syndrome is, how it leads to heart disease, and how one can reverse it and keep it from coming back. You have metabolic syndrome if you have 3 of the following 5 criteria: abdominal obesity, high blood pressure, high triglycerides, low HDL cholesterol, and high blood sugar.  Hypertension and Heart Disease Clinical staff conducted group or individual video education with verbal and written material and guidebook.  Patient learns that high blood pressure, or hypertension, is very common in the [place]. Hypertension is largely due to excessive salt intake, but other important risk factors include being overweight, physical inactivity, drinking too much alcohol, smoking, and not eating enough potassium  from fruits and vegetables. High blood pressure is a leading risk factor for heart attack, stroke, congestive heart failure, dementia, kidney failure, and premature death. Long-term effects of excessive salt intake include stiffening of the arteries and thickening of heart muscle and organ damage. Recommendations include ways to reduce hypertension and the risk of heart disease.  Diseases of Our Time - Focusing on Diabetes Clinical staff conducted group or individual video education with verbal and written material and guidebook.  Patient learns why the best way to stop diseases of our time is prevention, through food and other lifestyle changes. Medicine (such as prescription pills and surgeries) is often only a Band-Aid on the problem, not a long-term solution. Most common diseases of our time include obesity, type 2 diabetes, hypertension, heart disease, and cancer. The Pritikin Program is recommended and has been proven to help reduce, reverse, and/or prevent the damaging effects of metabolic syndrome.  Nutrition   Overview of the Pritikin Eating Plan  Clinical staff conducted group or individual video education with verbal and written material and guidebook.  Patient learns about the Pritikin Eating Plan for disease risk reduction. The Pritikin Eating Plan emphasizes a wide variety of unrefined, minimally-processed carbohydrates, like fruits, vegetables, whole grains, and legumes. Go, Caution, and Stop food choices are explained. Plant-based and lean animal proteins are emphasized. Rationale provided for low sodium intake for blood pressure control, low added  sugars for blood sugar stabilization, and low added fats and oils for coronary artery disease risk reduction and weight management.  Calorie Density  Clinical staff conducted group or individual video education with verbal and written material and guidebook.  Patient learns about calorie density and how it impacts the Pritikin Eating Plan.  Knowing the characteristics of the food you choose will help you decide whether those foods will lead to weight gain or weight loss, and whether you want to consume more or less of them. Weight loss is usually a side effect of the Pritikin Eating Plan because of its focus on low calorie-dense foods.  Label Reading  Clinical staff conducted group or individual video education with verbal and written material and guidebook.  Patient learns about the Pritikin recommended label reading guidelines and corresponding recommendations regarding calorie density, added sugars, sodium content, and whole grains.  Dining Out - Part 1  Clinical staff conducted group or individual video education with verbal and written material and guidebook.  Patient learns that restaurant meals can be sabotaging because they can be so high in calories, fat, sodium, and/or sugar. Patient learns recommended strategies on how to positively address this and avoid unhealthy pitfalls.  Facts on Fats  Clinical staff conducted group or individual video education with verbal and written material and guidebook.  Patient learns that lifestyle modifications can be just as effective, if not more so, as many medications for lowering your risk of heart disease. A Pritikin lifestyle can help to reduce your risk of inflammation and atherosclerosis (cholesterol build-up, or plaque, in the artery walls). Lifestyle interventions such as dietary choices and physical activity address the cause of atherosclerosis. A review of the types of fats and their impact on blood cholesterol levels, along with dietary recommendations to reduce fat intake is also included.  Nutrition Action Plan  Clinical staff conducted group or individual video education with verbal and written material and guidebook.  Patient learns how to incorporate Pritikin recommendations into their lifestyle. Recommendations include planning and keeping personal health goals in mind as an  important part of their success.  Healthy Mind-Set    Healthy Minds, Bodies, Hearts  Clinical staff conducted group or individual video education with verbal and written material and guidebook.  Patient learns how to identify when they are stressed. Video will discuss the impact of that stress, as well as the many benefits of stress management. Patient will also be introduced to stress management techniques. The way we think, act, and feel has an impact on our hearts.  How Our Thoughts Can Heal Our Hearts  Clinical staff conducted group or individual video education with verbal and written material and guidebook.  Patient learns that negative thoughts can cause depression and anxiety. This can result in negative lifestyle behavior and serious health problems. Cognitive behavioral therapy is an effective method to help control our thoughts in order to change and improve our emotional outlook.  Additional Videos:  Exercise    Improving Performance  Clinical staff conducted group or individual video education with verbal and written material and guidebook.  Patient learns to use a non-linear approach by alternating intensity levels and lengths of time spent exercising to help burn more calories and lose more body fat. Cardiovascular exercise helps improve heart health, metabolism, hormonal balance, blood sugar control, and recovery from fatigue. Resistance training improves strength, endurance, balance, coordination, reaction time, metabolism, and muscle mass. Flexibility exercise improves circulation, posture, and balance. Seek guidance from your physician and exercise  physiologist before implementing an exercise routine and learn your capabilities and proper form for all exercise.  Introduction to Yoga  Clinical staff conducted group or individual video education with verbal and written material and guidebook.  Patient learns about yoga, a discipline of the coming together of mind, breath, and  body. The benefits of yoga include improved flexibility, improved range of motion, better posture and core strength, increased lung function, weight loss, and positive self-image. Yoga's heart health benefits include lowered blood pressure, healthier heart rate, decreased cholesterol and triglyceride levels, improved immune function, and reduced stress. Seek guidance from your physician and exercise physiologist before implementing an exercise routine and learn your capabilities and proper form for all exercise.  Medical   Aging: Enhancing Your Quality of Life  Clinical staff conducted group or individual video education with verbal and written material and guidebook.  Patient learns key strategies and recommendations to stay in good physical health and enhance quality of life, such as prevention strategies, having an advocate, securing a Health Care Proxy and Power of Attorney, and keeping a list of medications and system for tracking them. It also discusses how to avoid risk for bone loss.  Biology of Weight Control  Clinical staff conducted group or individual video education with verbal and written material and guidebook.  Patient learns that weight gain occurs because we consume more calories than we burn (eating more, moving less). Even if your body weight is normal, you may have higher ratios of fat compared to muscle mass. Too much body fat puts you at increased risk for cardiovascular disease, heart attack, stroke, type 2 diabetes, and obesity-related cancers. In addition to exercise, following the Pritikin Eating Plan can help reduce your risk.  Decoding Lab Results  Clinical staff conducted group or individual video education with verbal and written material and guidebook.  Patient learns that lab test reflects one measurement whose values change over time and are influenced by many factors, including medication, stress, sleep, exercise, food, hydration, pre-existing medical conditions, and  more. It is recommended to use the knowledge from this video to become more involved with your lab results and evaluate your numbers to speak with your doctor.   Diseases of Our Time - Overview  Clinical staff conducted group or individual video education with verbal and written material and guidebook.  Patient learns that according to the CDC, 50% to 70% of chronic diseases (such as obesity, type 2 diabetes, elevated lipids, hypertension, and heart disease) are avoidable through lifestyle improvements including healthier food choices, listening to satiety cues, and increased physical activity.  Sleep Disorders Clinical staff conducted group or individual video education with verbal and written material and guidebook.  Patient learns how good quality and duration of sleep are important to overall health and well-being. Patient also learns about sleep disorders and how they impact health along with recommendations to address them, including discussing with a physician.  Nutrition  Dining Out - Part 2 Clinical staff conducted group or individual video education with verbal and written material and guidebook.  Patient learns how to plan ahead and communicate in order to maximize their dining experience in a healthy and nutritious manner. Included are recommended food choices based on the type of restaurant the patient is visiting.   Fueling a Banker conducted group or individual video education with verbal and written material and guidebook.  There is a strong connection between our food choices and our health. Diseases like obesity and type  2 diabetes are very prevalent and are in large-part due to lifestyle choices. The Pritikin Eating Plan provides plenty of food and hunger-curbing satisfaction. It is easy to follow, affordable, and helps reduce health risks.  Menu Workshop  Clinical staff conducted group or individual video education with verbal and written material and  guidebook.  Patient learns that restaurant meals can sabotage health goals because they are often packed with calories, fat, sodium, and sugar. Recommendations include strategies to plan ahead and to communicate with the manager, chef, or server to help order a healthier meal.  Planning Your Eating Strategy  Clinical staff conducted group or individual video education with verbal and written material and guidebook.  Patient learns about the Pritikin Eating Plan and its benefit of reducing the risk of disease. The Pritikin Eating Plan does not focus on calories. Instead, it emphasizes high-quality, nutrient-rich foods. By knowing the characteristics of the foods, we choose, we can determine their calorie density and make informed decisions.  Targeting Your Nutrition Priorities  Clinical staff conducted group or individual video education with verbal and written material and guidebook.  Patient learns that lifestyle habits have a tremendous impact on disease risk and progression. This video provides eating and physical activity recommendations based on your personal health goals, such as reducing LDL cholesterol, losing weight, preventing or controlling type 2 diabetes, and reducing high blood pressure.  Vitamins and Minerals  Clinical staff conducted group or individual video education with verbal and written material and guidebook.  Patient learns different ways to obtain key vitamins and minerals, including through a recommended healthy diet. It is important to discuss all supplements you take with your doctor.   Healthy Mind-Set    Smoking Cessation  Clinical staff conducted group or individual video education with verbal and written material and guidebook.  Patient learns that cigarette smoking and tobacco addiction pose a serious health risk which affects millions of people. Stopping smoking will significantly reduce the risk of heart disease, lung disease, and many forms of cancer.  Recommended strategies for quitting are covered, including working with your doctor to develop a successful plan.  Culinary   Becoming a Set designer conducted group or individual video education with verbal and written material and guidebook.  Patient learns that cooking at home can be healthy, cost-effective, quick, and puts them in control. Keys to cooking healthy recipes will include looking at your recipe, assessing your equipment needs, planning ahead, making it simple, choosing cost-effective seasonal ingredients, and limiting the use of added fats, salts, and sugars.  Cooking - Breakfast and Snacks  Clinical staff conducted group or individual video education with verbal and written material and guidebook.  Patient learns how important breakfast is to satiety and nutrition through the entire day. Recommendations include key foods to eat during breakfast to help stabilize blood sugar levels and to prevent overeating at meals later in the day. Planning ahead is also a key component.  Cooking - Educational psychologist conducted group or individual video education with verbal and written material and guidebook.  Patient learns eating strategies to improve overall health, including an approach to cook more at home. Recommendations include thinking of animal protein as a side on your plate rather than center stage and focusing instead on lower calorie dense options like vegetables, fruits, whole grains, and plant-based proteins, such as beans. Making sauces in large quantities to freeze for later and leaving the skin on your vegetables are also recommended  to maximize your experience.  Cooking - Healthy Salads and Dressing Clinical staff conducted group or individual video education with verbal and written material and guidebook.  Patient learns that vegetables, fruits, whole grains, and legumes are the foundations of the Pritikin Eating Plan. Recommendations include how  to incorporate each of these in flavorful and healthy salads, and how to create homemade salad dressings. Proper handling of ingredients is also covered. Cooking - Soups and [employer] - Soups and Desserts Clinical staff conducted group or individual video education with verbal and written material and guidebook.  Patient learns that Pritikin soups and desserts make for easy, nutritious, and delicious snacks and meal components that are low in sodium, fat, sugar, and calorie density, while high in vitamins, minerals, and filling fiber. Recommendations include simple and healthy ideas for soups and desserts.   Overview     The Pritikin Solution Program Overview Clinical staff conducted group or individual video education with verbal and written material and guidebook.  Patient learns that the results of the Pritikin Program have been documented in more than 100 articles published in peer-reviewed journals, and the benefits include reducing risk factors for (and, in some cases, even reversing) high cholesterol, high blood pressure, type 2 diabetes, obesity, and more! An overview of the three key pillars of the Pritikin Program will be covered: eating well, doing regular exercise, and having a healthy mind-set.  WORKSHOPS  Exercise: Exercise Basics: Building Your Action Plan Clinical staff led group instruction and group discussion with PowerPoint presentation and patient guidebook. To enhance the learning environment the use of posters, models and videos may be added. At the conclusion of this workshop, patients will comprehend the difference between physical activity and exercise, as well as the benefits of incorporating both, into their routine. Patients will understand the FITT (Frequency, Intensity, Time, and Type) principle and how to use it to build an exercise action plan. In addition, safety concerns and other considerations for exercise and cardiac rehab will be addressed by the  presenter. The purpose of this lesson is to promote a comprehensive and effective weekly exercise routine in order to improve patients' overall level of fitness.   Managing Heart Disease: Your Path to a Healthier Heart Clinical staff led group instruction and group discussion with PowerPoint presentation and patient guidebook. To enhance the learning environment the use of posters, models and videos may be added.At the conclusion of this workshop, patients will understand the anatomy and physiology of the heart. Additionally, they will understand how Pritikin's three pillars impact the risk factors, the progression, and the management of heart disease.  The purpose of this lesson is to provide a high-level overview of the heart, heart disease, and how the Pritikin lifestyle positively impacts risk factors.  Exercise Biomechanics Clinical staff led group instruction and group discussion with PowerPoint presentation and patient guidebook. To enhance the learning environment the use of posters, models and videos may be added. Patients will learn how the structural parts of their bodies function and how these functions impact their daily activities, movement, and exercise. Patients will learn how to promote a neutral spine, learn how to manage pain, and identify ways to improve their physical movement in order to promote healthy living. The purpose of this lesson is to expose patients to common physical limitations that impact physical activity. Participants will learn practical ways to adapt and manage aches and pains, and to minimize their effect on regular exercise. Patients will learn how to maintain good  posture while sitting, walking, and lifting.  Balance Training and Fall Prevention  Clinical staff led group instruction and group discussion with PowerPoint presentation and patient guidebook. To enhance the learning environment the use of posters, models and videos may be added. At the  conclusion of this workshop, patients will understand the importance of their sensorimotor skills (vision, proprioception, and the vestibular system) in maintaining their ability to balance as they age. Patients will apply a variety of balancing exercises that are appropriate for their current level of function. Patients will understand the common causes for poor balance, possible solutions to these problems, and ways to modify their physical environment in order to minimize their fall risk. The purpose of this lesson is to teach patients about the importance of maintaining balance as they age and ways to minimize their risk of falling.  WORKSHOPS   Nutrition:  Fueling a Ship broker led group instruction and group discussion with PowerPoint presentation and patient guidebook. To enhance the learning environment the use of posters, models and videos may be added. Patients will review the foundational principles of the Pritikin Eating Plan and understand what constitutes a serving size in each of the food groups. Patients will also learn Pritikin-friendly foods that are better choices when away from home and review make-ahead meal and snack options. Calorie density will be reviewed and applied to three nutrition priorities: weight maintenance, weight loss, and weight gain. The purpose of this lesson is to reinforce (in a group setting) the key concepts around what patients are recommended to eat and how to apply these guidelines when away from home by planning and selecting Pritikin-friendly options. Patients will understand how calorie density may be adjusted for different weight management goals.  Mindful Eating  Clinical staff led group instruction and group discussion with PowerPoint presentation and patient guidebook. To enhance the learning environment the use of posters, models and videos may be added. Patients will briefly review the concepts of the Pritikin Eating Plan and the  importance of low-calorie dense foods. The concept of mindful eating will be introduced as well as the importance of paying attention to internal hunger signals. Triggers for non-hunger eating and techniques for dealing with triggers will be explored. The purpose of this lesson is to provide patients with the opportunity to review the basic principles of the Pritikin Eating Plan, discuss the value of eating mindfully and how to measure internal cues of hunger and fullness using the Hunger Scale. Patients will also discuss reasons for non-hunger eating and learn strategies to use for controlling emotional eating.  Targeting Your Nutrition Priorities Clinical staff led group instruction and group discussion with PowerPoint presentation and patient guidebook. To enhance the learning environment the use of posters, models and videos may be added. Patients will learn how to determine their genetic susceptibility to disease by reviewing their family history. Patients will gain insight into the importance of diet as part of an overall healthy lifestyle in mitigating the impact of genetics and other environmental insults. The purpose of this lesson is to provide patients with the opportunity to assess their personal nutrition priorities by looking at their family history, their own health history and current risk factors. Patients will also be able to discuss ways of prioritizing and modifying the Pritikin Eating Plan for their highest risk areas  Menu  Clinical staff led group instruction and group discussion with PowerPoint presentation and patient guidebook. To enhance the learning environment the use of posters, models and videos  may be added. Using menus brought in from [employer], or printed from [employer], patients will apply the Pritikin dining out guidelines that were presented in the [employer] video. Patients will also be able to practice these guidelines in a variety of  provided scenarios. The purpose of this lesson is to provide patients with the opportunity to practice hands-on learning of the Pritikin Dining Out guidelines with actual menus and practice scenarios.  Label Reading Clinical staff led group instruction and group discussion with PowerPoint presentation and patient guidebook. To enhance the learning environment the use of posters, models and videos may be added. Patients will review and discuss the Pritikin label reading guidelines presented in Pritikin's Label Reading Educational series video. Using fool labels brought in from local grocery stores and markets, patients will apply the label reading guidelines and determine if the packaged food meet the Pritikin guidelines. The purpose of this lesson is to provide patients with the opportunity to review, discuss, and practice hands-on learning of the Pritikin Label Reading guidelines with actual packaged food labels. Cooking School  Pritikin's [employer] are designed to teach patients ways to prepare quick, simple, and affordable recipes at home. The importance of nutrition's role in chronic disease risk reduction is reflected in its emphasis in the overall Pritikin program. By learning how to prepare essential core Pritikin Eating Plan recipes, patients will increase control over what they eat; be able to customize the flavor of foods without the use of added salt, sugar, or fat; and improve the quality of the food they consume. By learning a set of core recipes which are easily assembled, quickly prepared, and affordable, patients are more likely to prepare more healthy foods at home. These workshops focus on convenient breakfasts, simple entres, side dishes, and desserts which can be prepared with minimal effort and are consistent with nutrition recommendations for cardiovascular risk reduction. Cooking [employer] are taught by a Armed forces logistics/support/administrative officer (RD) who has been trained by the  [employer]. The chef or RD has a clear understanding of the importance of minimizing - if not completely eliminating - added fat, sugar, and sodium in recipes. Throughout the series of Cooking School Workshop sessions, patients will learn about healthy ingredients and efficient methods of cooking to build confidence in their capability to prepare    Cooking School weekly topics:  Adding Flavor- Sodium-Free  Fast and Healthy Breakfasts  Powerhouse Plant-Based Proteins  Satisfying Salads and Dressings  Simple Sides and Sauces  International Cuisine-Spotlight on the [employer] Zones  Delicious Desserts  Savory Soups  [employer] - Meals in a Astronomer Appetizers and Snacks  Comforting Weekend Breakfasts  One-Pot Wonders   Fast Evening Meals  Landscape architect Your Pritikin Plate  WORKSHOPS   Healthy Mindset (Psychosocial):  Focused Goals, Sustainable Changes Clinical staff led group instruction and group discussion with PowerPoint presentation and patient guidebook. To enhance the learning environment the use of posters, models and videos may be added. Patients will be able to apply effective goal setting strategies to establish at least one personal goal, and then take consistent, meaningful action toward that goal. They will learn to identify common barriers to achieving personal goals and develop strategies to overcome them. Patients will also gain an understanding of how our mind-set can impact our ability to achieve goals and the importance of cultivating a positive and growth-oriented mind-set. The purpose of this lesson is to provide patients with  a deeper understanding of how to set and achieve personal goals, as well as the tools and strategies needed to overcome common obstacles which may arise along the way.  From Head to Heart: The Power of a Healthy Outlook  Clinical staff led group instruction and group discussion with PowerPoint presentation  and patient guidebook. To enhance the learning environment the use of posters, models and videos may be added. Patients will be able to recognize and describe the impact of emotions and mood on physical health. They will discover the importance of self-care and explore self-care practices which may work for them. Patients will also learn how to utilize the 4 C's to cultivate a healthier outlook and better manage stress and challenges. The purpose of this lesson is to demonstrate to patients how a healthy outlook is an essential part of maintaining good health, especially as they continue their cardiac rehab journey.  Healthy Sleep for a Healthy Heart Clinical staff led group instruction and group discussion with PowerPoint presentation and patient guidebook. To enhance the learning environment the use of posters, models and videos may be added. At the conclusion of this workshop, patients will be able to demonstrate knowledge of the importance of sleep to overall health, well-being, and quality of life. They will understand the symptoms of, and treatments for, common sleep disorders. Patients will also be able to identify daytime and nighttime behaviors which impact sleep, and they will be able to apply these tools to help manage sleep-related challenges. The purpose of this lesson is to provide patients with a general overview of sleep and outline the importance of quality sleep. Patients will learn about a few of the most common sleep disorders. Patients will also be introduced to the concept of "sleep hygiene," and discover ways to self-manage certain sleeping problems through simple daily behavior changes. Finally, the workshop will motivate patients by clarifying the links between quality sleep and their goals of heart-healthy living.   Recognizing and Reducing Stress Clinical staff led group instruction and group discussion with PowerPoint presentation and patient guidebook. To enhance the learning  environment the use of posters, models and videos may be added. At the conclusion of this workshop, patients will be able to understand the types of stress reactions, differentiate between acute and chronic stress, and recognize the impact that chronic stress has on their health. They will also be able to apply different coping mechanisms, such as reframing negative self-talk. Patients will have the opportunity to practice a variety of stress management techniques, such as deep abdominal breathing, progressive muscle relaxation, and/or guided imagery.  The purpose of this lesson is to educate patients on the role of stress in their lives and to provide healthy techniques for coping with it.  Learning Barriers/Preferences:  Learning Barriers/Preferences - 02/13/23 1328       Learning Barriers/Preferences   Learning Barriers Sight    Learning Preferences Video;Skilled Demonstration;Individual Instruction;Group Instruction;Computer/Internet;Pictoral             Education Topics:  Knowledge Questionnaire Score:  Knowledge Questionnaire Score - 02/13/23 1331       Knowledge Questionnaire Score   Pre Score 21/24             Core Components/Risk Factors/Patient Goals at Admission:  Personal Goals and Risk Factors at Admission - 02/13/23 1328       Core Components/Risk Factors/Patient Goals on Admission    Weight Management Yes;Obesity;Weight Loss    Intervention Weight Management: Develop a combined nutrition and exercise  program designed to reach desired caloric intake, while maintaining appropriate intake of nutrient and fiber, sodium and fats, and appropriate energy expenditure required for the weight goal.;Weight Management: Provide education and appropriate resources to help participant work on and attain dietary goals.;Weight Management/Obesity: Establish reasonable short term and long term weight goals.;Obesity: Provide education and appropriate resources to help participant work  on and attain dietary goals.    Admit Weight 338 lb 13.6 oz (153.7 kg)    Expected Outcomes Short Term: Continue to assess and modify interventions until short term weight is achieved;Long Term: Adherence to nutrition and physical activity/exercise program aimed toward attainment of established weight goal;Weight Loss: Understanding of general recommendations for a balanced deficit meal plan, which promotes 1-2 lb weight loss per week and includes a negative energy balance of [phone] kcal/d;Understanding recommendations for meals to include 15-35% energy as protein, 25-35% energy from fat, 35-60% energy from carbohydrates, less than 200mg  of dietary cholesterol, 20-35 gm of total fiber daily;Understanding of distribution of calorie intake throughout the day with the consumption of 4-5 meals/snacks    Improve shortness of breath with ADL's Yes    Intervention Provide education, individualized exercise plan and daily activity instruction to help decrease symptoms of SOB with activities of daily living.    Expected Outcomes Short Term: Improve cardiorespiratory fitness to achieve a reduction of symptoms when performing ADLs    Heart Failure Yes    Intervention Provide a combined exercise and nutrition program that is supplemented with education, support and counseling about heart failure. Directed toward relieving symptoms such as shortness of breath, decreased exercise tolerance, and extremity edema.    Expected Outcomes Improve functional capacity of life;Short term: Attendance in program 2-3 days a week with increased exercise capacity. Reported lower sodium intake. Reported increased fruit and vegetable intake. Reports medication compliance.;Short term: Daily weights obtained and reported for increase. Utilizing diuretic protocols set by physician.;Long term: Adoption of self-care skills and reduction of barriers for early signs and symptoms recognition and intervention leading to self-care maintenance.     Hypertension Yes    Intervention Provide education on lifestyle modifcations including regular physical activity/exercise, weight management, moderate sodium restriction and increased consumption of fresh fruit, vegetables, and low fat dairy, alcohol moderation, and smoking cessation.;Monitor prescription use compliance.    Expected Outcomes Short Term: Continued assessment and intervention until BP is < 140/72mm HG in hypertensive participants. < 130/37mm HG in hypertensive participants with diabetes, heart failure or chronic kidney disease.;Long Term: Maintenance of blood pressure at goal levels.    Lipids Yes    Intervention Provide education and support for participant on nutrition & aerobic/resistive exercise along with prescribed medications to achieve LDL 70mg , HDL >40mg .    Expected Outcomes Short Term: Participant states understanding of desired cholesterol values and is compliant with medications prescribed. Participant is following exercise prescription and nutrition guidelines.;Long Term: Cholesterol controlled with medications as prescribed, with individualized exercise RX and with personalized nutrition plan. Value goals: LDL < 70mg , HDL > 40 mg.             Core Components/Risk Factors/Patient Goals Review:   Goals and Risk Factor Review     Row Name 02/19/23 1428 03/14/23 1720 04/10/23 0855 05/04/23 1009       Core Components/Risk Factors/Patient Goals Review   Personal Goals Review Weight Management/Obesity;Improve shortness of breath with ADL's;Heart Failure;Hypertension;Lipids Weight Management/Obesity;Improve shortness of breath with ADL's;Heart Failure;Hypertension;Lipids Weight Management/Obesity;Improve shortness of breath with ADL's;Heart Failure;Hypertension;Lipids Weight Management/Obesity;Improve shortness of breath  with ADL's;Heart Failure;Hypertension;Lipids    Review Kailei started cardiac rehab on 02/19/23. Rylee did well with exercise. Vital signs were  stable.  Oxygen satruation were wNL. Aizza uses a mobility scooter and wore oxygen with exercise. Kymberlyn continues to do  well with exercise for her fitness. Vital signs were stable.  Oxygen satruation have been WNL. Jacee continues to do  well with exercise for her fitness level . Vital signs have been  stable.  Oxygen satruation have been WNL. Weights have been variable. unsure is weights are acurate as Jasmine December cannot fully stand up. Lisvet continues to do  well with exercise for her fitness level . Vital signs have been  stable.  Oxygen satruation have been WNL. Weights have been variable. unsure is weights are acurate as Jasmine December cannot fully stand up. Talibah will complete cardiac rehab on 05/09/23.    Expected Outcomes Anaika will continue to participate in cardiac rehab for exercise, nutrition and lifestyle modifications Mikia will continue to participate in cardiac rehab for exercise, nutrition and lifestyle modifications Brady will continue to participate in cardiac rehab for exercise, nutrition and lifestyle modifications Gregory will continue to participate in cardiac rehab for exercise, nutrition and lifestyle modifications             Core Components/Risk Factors/Patient Goals at Discharge (Final Review):   Goals and Risk Factor Review - 05/04/23 1009       Core Components/Risk Factors/Patient Goals Review   Personal Goals Review Weight Management/Obesity;Improve shortness of breath with ADL's;Heart Failure;Hypertension;Lipids    Review Deloris continues to do  well with exercise for her fitness level . Vital signs have been  stable.  Oxygen satruation have been WNL. Weights have been variable. unsure is weights are acurate as Jasmine December cannot fully stand up. Rockell will complete cardiac rehab on 05/09/23.    Expected Outcomes Arthea will continue to participate in cardiac rehab for exercise, nutrition and lifestyle modifications             ITP Comments:  ITP Comments     Row Name  02/13/23 1227 02/19/23 1423 03/14/23 1714 04/10/23 0851 05/04/23 1005   ITP Comments Armanda Magic, MD:  Medical Director. Introduction to the [facility] / Intensive Cardiac Rehab.  Initial oreintation packet reviewed with the patient. 30 Day ITP Review. Ikesha started exercise at cardiac rehab on 02/19/23. Ramaya did well with exercise for her fintess level 30 Day ITP Review. Christain continues to have good attendance and participaiton with  exercise at cardiac rehab 30 Day ITP Review. Khalani continues to have good attendance and participaiton with  exercise at cardiac rehab when in attendance 30 Day ITP Review. Carlotta continues to have good attendance and participaiton with  exercise at cardiac rehab when in attendance. Anaja will complete caridac rehab on 05/09/23            Comments: See ITP comments.Thayer Headings RN BSN

## 2023-05-06 DIAGNOSIS — G4733 Obstructive sleep apnea (adult) (pediatric): Secondary | ICD-10-CM | POA: Diagnosis not present

## 2023-05-06 NOTE — Procedures (Signed)
   Patient Name: Sharon Vasquez, Sharon Vasquez Date: 05/01/2023 Gender: Female D.O.B: [birth year] Age (years): 1 Referring Provider: Ames Dura, NP Height (inches): 74 Interpreting Physician: Jetty Duhamel MD, ABSM Weight (lbs): 352 RPSGT: Shelah Lewandowsky BMI: 45 [name]: [id number] Neck Size: 17.00  CLINICAL INFORMATION The patient is referred for a CPAP titration to treat sleep apnea.  Date of NPSG, Split Night or HST: HST 12/20/17 AHI 20.6/hr, desaturation to 69%, body weight 333 lbs  SLEEP STUDY TECHNIQUE As per the AASM Manual for the Scoring of Sleep and Associated Events v2.3 (April 2016) with a hypopnea requiring 4% desaturations.  The channels recorded and monitored were frontal, central and occipital EEG, electrooculogram (EOG), submentalis EMG (chin), nasal and oral airflow, thoracic and abdominal wall motion, anterior tibialis EMG, snore microphone, electrocardiogram, and pulse oximetry. Continuous positive airway pressure (CPAP) was initiated at the beginning of the study and titrated to treat sleep-disordered breathing.  MEDICATIONS Medications self-administered by patient taken the night of the study : none reported  TECHNICIAN COMMENTS Comments added by technician: NONE Comments added by scorer: N/A  RESPIRATORY PARAMETERS Optimal PAP Pressure (cm): 16 AHI at Optimal Pressure (/hr): 0 Overall Minimal O2 (%): 82.0 Supine % at Optimal Pressure (%): 100 Minimal O2 at Optimal Pressure (%): 85.0   SLEEP ARCHITECTURE The study was initiated at 10:07:00 PM and ended at 5:09:43 AM.  Sleep onset time was 18.7 minutes and the sleep efficiency was 78.9%. The total sleep time was 333.5 minutes.  The patient spent 14.2% of the night in stage N1 sleep, 62.4% in stage N2 sleep, 0.0% in stage N3 and 23.4% in REM.Stage REM latency was 49.5 minutes  Wake after sleep onset was 70.6. Alpha intrusion was absent. Supine sleep was 84.24%.  CARDIAC DATA The 2 lead EKG demonstrated atrial  fibrillation. The mean heart rate was 76.3 beats per minute. Other EKG findings include: PVCs.  LEG MOVEMENT DATA The total Periodic Limb Movements of Sleep (PLMS) were 0. The PLMS index was 0.0. A PLMS index of <15 is considered normal in adults.  IMPRESSIONS - The optimal PAP pressure was 16 cm of water. - Central sleep apnea was not noted during this titration (CAI = 0.2/h). - Moderate oxygen desaturations were observed during this titration (min O2 = 82.0%). On CPAP 16, minimum O2 saturation 85%, Mean 92.4%. - The patient snored with moderate snoring volume during this titration study. - 2-lead EKG demonstrated: PVCs - Clinically significant periodic limb movements were not noted during this study. Arousals associated with PLMs were rare.  DIAGNOSIS - Obstructive Sleep Apnea (G47.33)  RECOMMENDATIONS - Trial of CPAP therapy on 16 cm H2O or autopap 10-20. - Patient wore a Medium size Resmed Full Face AirFit F20 mask and heated humidification. - Be careful with alcohol, sedatives and other CNS depressants that may worsen sleep apnea and disrupt normal sleep architecture. - Sleep hygiene should be reviewed to assess factors that may improve sleep quality. - Weight management and regular exercise should be initiated or continued.  [Electronically signed] 05/06/2023 11:41 AM  Jetty Duhamel MD, ABSM Diplomate, American Board of Sleep Medicine NPI: [id number]                          Jetty Duhamel Diplomate, American Board of Sleep Medicine  ELECTRONICALLY SIGNED ON:  05/06/2023, 11:28 AM [facility] PH: [phone]   FX: [phone] ACCREDITED BY THE AMERICAN ACADEMY OF SLEEP MEDICINE

## 2023-05-07 ENCOUNTER — Encounter (HOSPITAL_COMMUNITY)
Admission: RE | Admit: 2023-05-07 | Discharge: 2023-05-07 | Disposition: A | Discharge status: Home / Self Care | Payer: 59 | Source: Ambulatory Visit | Attending: Cardiology | Admitting: Cardiology

## 2023-05-07 DIAGNOSIS — I872 Venous insufficiency (chronic) (peripheral): Secondary | ICD-10-CM | POA: Diagnosis not present

## 2023-05-07 DIAGNOSIS — I5022 Chronic systolic (congestive) heart failure: Secondary | ICD-10-CM

## 2023-05-09 ENCOUNTER — Encounter (HOSPITAL_COMMUNITY)
Admission: RE | Admit: 2023-05-09 | Discharge: 2023-05-09 | Disposition: A | Discharge status: Home / Self Care | Payer: 59 | Source: Ambulatory Visit | Attending: Cardiology

## 2023-05-09 DIAGNOSIS — I5022 Chronic systolic (congestive) heart failure: Secondary | ICD-10-CM

## 2023-05-09 DIAGNOSIS — I872 Venous insufficiency (chronic) (peripheral): Secondary | ICD-10-CM | POA: Diagnosis not present

## 2023-05-09 NOTE — Progress Notes (Signed)
 Discharge Progress Report  Patient Details  Name: RAELLE CHAMBERS [name]: [id number] Date of Birth: 03/16/1951 Referring Provider:   Flowsheet Row INTENSIVE CARDIAC REHAB ORIENT from 02/13/2023 in [facility]  Referring Provider Morene Brownie, MD        Number of Visits: 50  Reason for Discharge:  Patient reached a stable level of exercise. Patient has met program and personal goals.  Smoking History:  Social History   Tobacco Use  Smoking Status Former   Current packs/day: 0.00   Average packs/day: 2.0 packs/day for 60.4 years (120.8 ttl pk-yrs)   Types: Cigarettes   Start date: 08/16/1961   Quit date: 01/02/2022   Years since quitting: 1.3  Smokeless Tobacco Never  Tobacco Comments   Pt smokes about 4 ciggs daily. Stopped 01/02/2022     Diagnosis:  Heart failure, chronic systolic (HCC)  ADL UCSD:   Initial Exercise Prescription:  Initial Exercise Prescription - 02/13/23 1300       Date of Initial Exercise RX and Referring Provider   Date 02/13/23    Referring Provider Morene Brownie, MD    Expected Discharge Date 05/08/22      T5 Nustep   Level 1    SPM 60    Minutes 20    METs 1      Prescription Details   Frequency (times per week) 3    Duration Progress to 30 minutes of continuous aerobic without signs/symptoms of physical distress      Intensity   THRR 40-80% of Max Heartrate 59-118    Ratings of Perceived Exertion 11-13    Perceived Dyspnea 0-4      Progression   Progression Continue progressive overload as per policy without signs/symptoms or physical distress.      Resistance Training   Training Prescription Yes    Weight 2 lbs    Reps 10-15             Discharge Exercise Prescription (Final Exercise Prescription Changes):  Exercise Prescription Changes - 05/09/23 1653       Response to Exercise   Blood Pressure (Admit) 102/72    Blood Pressure (Exit) 112/66    Heart  Rate (Admit) 87 bpm    Heart Rate (Exercise) 115 bpm    Heart Rate (Exit) 91 bpm    Rating of Perceived Exertion (Exercise) 11    Symptoms None    Comments Pt graduated from the CRP2 program    Duration Continue with 30 min of aerobic exercise without signs/symptoms of physical distress.    Intensity THRR unchanged      Progression   Progression Continue to progress workloads to maintain intensity without signs/symptoms of physical distress.    Average METs 1.8      Resistance Training   Training Prescription No    Weight No weights on Wednesdays      Interval Training   Interval Training No      Oxygen    Oxygen  Continuous    Liters 2      T5 Nustep   Level 1    SPM 96    Minutes 30    METs 1.8      Home Exercise Plan   Plans to continue exercise at Home (comment)    Frequency Add 2 additional days to program exercise sessions.    Initial Home Exercises Provided 04/18/23             Functional  Capacity:  6 Minute Walk     Row Name 02/13/23 1317 05/07/23 1429       6 Minute Walk   Phase Initial  NUSTEP TEST Discharge    Distance 1017 feet  NUSTEP 1584 feet    Distance % Change -- 55.75 %    Distance Feet Change -- 567 ft    Walk Time 6 minutes 6 minutes  NS    # of Rest Breaks 0 0    MPH 1.93 3    METS 1 1.9    RPE 11 9    Perceived Dyspnea  0 0    VO2 Peak 3.2 6.26    Symptoms Yes (comment) No    Comments Bilateral knee pain 9/10 --    Resting HR 86 bpm 84 bpm    Resting BP 110/70 112/68    Resting Oxygen  Saturation  93 % 98 %    Exercise Oxygen  Saturation  during 6 min walk 100 % --    Max Ex. HR 96 bpm 95 bpm    Max Ex. BP 100/60 98/68    2 Minute Post BP 104/70 --      Interval HR   1 Minute HR 93 --    2 Minute HR 93 --    3 Minute HR 85 --    4 Minute HR 86 --    5 Minute HR 84 --    6 Minute HR 94 --    Interval Heart Rate? Yes --      Interval Oxygen    Interval Oxygen ? Yes Yes  2L oxygen     Baseline Oxygen  Saturation % 93 % 98 %     1 Minute Oxygen  Saturation % 96 % 98 %    1 Minute Liters of Oxygen  2 L 2 L    2 Minute Oxygen  Saturation % 96 % 98 %    2 Minute Liters of Oxygen  2 L 2 L    3 Minute Oxygen  Saturation % 98 % 96 %    3 Minute Liters of Oxygen  2 L 2 L    4 Minute Oxygen  Saturation % 98 % 97 %    4 Minute Liters of Oxygen  2 L 2 L    5 Minute Oxygen  Saturation % 100 % 98 %    5 Minute Liters of Oxygen  2 L 2 L    6 Minute Oxygen  Saturation % 100 % 98 %    6 Minute Liters of Oxygen  2 L 2 L             Psychological, QOL, Others - Outcomes: PHQ 2/9:    05/09/2023    1:29 PM 02/13/2023   12:29 PM 12/13/2022   11:03 AM 11/29/2022    2:32 PM 10/25/2022    2:56 PM  Depression screen PHQ 2/9  Decreased Interest 0 0 0 1 0  Down, Depressed, Hopeless 0 1 0 1 1  PHQ - 2 Score 0 1 0 2 1  Altered sleeping 2 0 1 1 1   Tired, decreased energy 0 3 1 1 2   Change in appetite 1 0 1 1 0  Feeling bad or failure about yourself  0 0  0 1  Trouble concentrating 0 0 0 0 0  Moving slowly or fidgety/restless 0 0 0 0 0  Suicidal thoughts 0 0 0 0 0  PHQ-9 Score 3 4 3 5 5   Difficult doing work/chores Not difficult at all Not difficult at all Not difficult  at all Very difficult     Quality of Life:  Quality of Life - 05/09/23 1637       Quality of Life Scores   Health/Function Pre 18 %    Health/Function Post 19.07 %    Health/Function % Change 5.94 %    Socioeconomic Pre 23 %    Socioeconomic Post 26.4 %    Socioeconomic % Change  14.78 %    Psych/Spiritual Pre 28.29 %    Psych/Spiritual Post 27.36 %    Psych/Spiritual % Change -3.29 %    Family Pre 10 %    Family Post 24 %    Family % Change 140 %    GLOBAL Pre 20.52 %    GLOBAL Post 22.72 %    GLOBAL % Change 10.72 %             Personal Goals: Goals established at orientation with interventions provided to work toward goal.  Personal Goals and Risk Factors at Admission - 02/13/23 1328       Core Components/Risk Factors/Patient Goals on  Admission    Weight Management Yes;Obesity;Weight Loss    Intervention Weight Management: Develop a combined nutrition and exercise program designed to reach desired caloric intake, while maintaining appropriate intake of nutrient and fiber, sodium and fats, and appropriate energy expenditure required for the weight goal.;Weight Management: Provide education and appropriate resources to help participant work on and attain dietary goals.;Weight Management/Obesity: Establish reasonable short term and long term weight goals.;Obesity: Provide education and appropriate resources to help participant work on and attain dietary goals.    Admit Weight 338 lb 13.6 oz (153.7 kg)    Expected Outcomes Short Term: Continue to assess and modify interventions until short term weight is achieved;Long Term: Adherence to nutrition and physical activity/exercise program aimed toward attainment of established weight goal;Weight Loss: Understanding of general recommendations for a balanced deficit meal plan, which promotes 1-2 lb weight loss per week and includes a negative energy balance of [phone] kcal/d;Understanding recommendations for meals to include 15-35% energy as protein, 25-35% energy from fat, 35-60% energy from carbohydrates, less than 200mg  of dietary cholesterol, 20-35 gm of total fiber daily;Understanding of distribution of calorie intake throughout the day with the consumption of 4-5 meals/snacks    Improve shortness of breath with ADL's Yes    Intervention Provide education, individualized exercise plan and daily activity instruction to help decrease symptoms of SOB with activities of daily living.    Expected Outcomes Short Term: Improve cardiorespiratory fitness to achieve a reduction of symptoms when performing ADLs    Heart Failure Yes    Intervention Provide a combined exercise and nutrition program that is supplemented with education, support and counseling about heart failure. Directed toward relieving  symptoms such as shortness of breath, decreased exercise tolerance, and extremity edema.    Expected Outcomes Improve functional capacity of life;Short term: Attendance in program 2-3 days a week with increased exercise capacity. Reported lower sodium intake. Reported increased fruit and vegetable intake. Reports medication compliance.;Short term: Daily weights obtained and reported for increase. Utilizing diuretic protocols set by physician.;Long term: Adoption of self-care skills and reduction of barriers for early signs and symptoms recognition and intervention leading to self-care maintenance.    Hypertension Yes    Intervention Provide education on lifestyle modifcations including regular physical activity/exercise, weight management, moderate sodium restriction and increased consumption of fresh fruit, vegetables, and low fat dairy, alcohol moderation, and smoking cessation.;Monitor prescription use compliance.  Expected Outcomes Short Term: Continued assessment and intervention until BP is < 140/54mm HG in hypertensive participants. < 130/81mm HG in hypertensive participants with diabetes, heart failure or chronic kidney disease.;Long Term: Maintenance of blood pressure at goal levels.    Lipids Yes    Intervention Provide education and support for participant on nutrition & aerobic/resistive exercise along with prescribed medications to achieve LDL 70mg , HDL >40mg .    Expected Outcomes Short Term: Participant states understanding of desired cholesterol values and is compliant with medications prescribed. Participant is following exercise prescription and nutrition guidelines.;Long Term: Cholesterol controlled with medications as prescribed, with individualized exercise RX and with personalized nutrition plan. Value goals: LDL < 70mg , HDL > 40 mg.              Personal Goals Discharge:  Goals and Risk Factor Review     Row Name 02/19/23 1428 03/14/23 1720 04/10/23 0855 05/04/23 1009        Core Components/Risk Factors/Patient Goals Review   Personal Goals Review Weight Management/Obesity;Improve shortness of breath with ADL's;Heart Failure;Hypertension;Lipids Weight Management/Obesity;Improve shortness of breath with ADL's;Heart Failure;Hypertension;Lipids Weight Management/Obesity;Improve shortness of breath with ADL's;Heart Failure;Hypertension;Lipids Weight Management/Obesity;Improve shortness of breath with ADL's;Heart Failure;Hypertension;Lipids    Review Karrie started cardiac rehab on 02/19/23. Christia did well with exercise. Vital signs were stable.  Oxygen  satruation were wNL. Ivis uses a mobility scooter and wore oxygen  with exercise. Xitlally continues to do  well with exercise for her fitness. Vital signs were stable.  Oxygen  satruation have been WNL. Briceyda continues to do  well with exercise for her fitness level . Vital signs have been  stable.  Oxygen  satruation have been WNL. Weights have been variable. unsure is weights are acurate as Reena cannot fully stand up. Amybeth continues to do  well with exercise for her fitness level . Vital signs have been  stable.  Oxygen  satruation have been WNL. Weights have been variable. unsure is weights are acurate as Reena cannot fully stand up. Jizel will complete cardiac rehab on 05/09/23.    Expected Outcomes Alieah will continue to participate in cardiac rehab for exercise, nutrition and lifestyle modifications Laurence will continue to participate in cardiac rehab for exercise, nutrition and lifestyle modifications Maesyn will continue to participate in cardiac rehab for exercise, nutrition and lifestyle modifications Katelynd will continue to participate in cardiac rehab for exercise, nutrition and lifestyle modifications             Exercise Goals and Review:  Exercise Goals     Row Name 02/13/23 1321             Exercise Goals   Increase Physical Activity Yes       Intervention Provide advice, education, support and  counseling about physical activity/exercise needs.;Develop an individualized exercise prescription for aerobic and resistive training based on initial evaluation findings, risk stratification, comorbidities and participant's personal goals.       Expected Outcomes Short Term: Attend rehab on a regular basis to increase amount of physical activity.;Long Term: Exercising regularly at least 3-5 days a week.;Long Term: Add in home exercise to make exercise part of routine and to increase amount of physical activity.       Increase Strength and Stamina Yes       Intervention Provide advice, education, support and counseling about physical activity/exercise needs.;Develop an individualized exercise prescription for aerobic and resistive training based on initial evaluation findings, risk stratification, comorbidities and participant's personal goals.  Expected Outcomes Short Term: Increase workloads from initial exercise prescription for resistance, speed, and METs.;Short Term: Perform resistance training exercises routinely during rehab and add in resistance training at home;Long Term: Improve cardiorespiratory fitness, muscular endurance and strength as measured by increased METs and functional capacity ( )       Able to understand and use rate of perceived exertion (RPE) scale Yes       Intervention Provide education and explanation on how to use RPE scale       Expected Outcomes Short Term: Able to use RPE daily in rehab to express subjective intensity level;Long Term:  Able to use RPE to guide intensity level when exercising independently       Knowledge and understanding of Target Heart Rate Range (THRR) Yes       Intervention Provide education and explanation of THRR including how the numbers were predicted and where they are located for reference       Expected Outcomes Short Term: Able to state/look up THRR;Long Term: Able to use THRR to govern intensity when exercising independently;Short Term:  Able to use daily as guideline for intensity in rehab       Understanding of Exercise Prescription Yes       Intervention Provide education, explanation, and written materials on patient's individual exercise prescription       Expected Outcomes Short Term: Able to explain program exercise prescription;Long Term: Able to explain home exercise prescription to exercise independently                Exercise Goals Re-Evaluation:  Exercise Goals Re-Evaluation     Row Name 02/19/23 1441 03/26/23 1611 04/27/23 1500 05/09/23 1500       Exercise Goal Re-Evaluation   Exercise Goals Review Increase Physical Activity;Understanding of Exercise Prescription;Increase Strength and Stamina;Able to understand and use Dyspnea scale;Knowledge and understanding of Target Heart Rate Range (THRR);Able to understand and use rate of perceived exertion (RPE) scale Increase Physical Activity;Understanding of Exercise Prescription;Increase Strength and Stamina;Able to understand and use Dyspnea scale;Knowledge and understanding of Target Heart Rate Range (THRR);Able to understand and use rate of perceived exertion (RPE) scale Increase Physical Activity;Understanding of Exercise Prescription;Increase Strength and Stamina;Able to understand and use Dyspnea scale;Knowledge and understanding of Target Heart Rate Range (THRR);Able to understand and use rate of perceived exertion (RPE) scale Increase Physical Activity;Understanding of Exercise Prescription;Increase Strength and Stamina;Able to understand and use Dyspnea scale;Knowledge and understanding of Target Heart Rate Range (THRR);Able to understand and use rate of perceived exertion (RPE) scale    Comments Pt's first day in the CRP2 program. Pt understnads exercise RX, THRR and RPE scale. Reviewed METs and goals Reviewed METs and goals. Karolyna progress on her goals of getting stronger, having more energy, and less SOB. She voices that she is able to stand longer at the  kitchen sink which is very improtant to her. She is also able to stand longer in other situations as well. She has less SOB with her ADL's. She also voices that she doesn't panic anymore about her SOB. Pt graduated from the CRP2 program today. Hollynn had peak METs of 1.9.  In regards to increased strength and stamina patient reports that she is able to stand at the sink for longer periods of time and in other situations as well. Pt voices less SOB.    Expected Outcomes Will continue to montior patient and progress exercise workloads as tolerated. Will continue to montior patient and progress exercise workloads as tolerated. Will  continue to montior patient and progress exercise workloads as tolerated. Pt will continue to do chair exercises at home. Pt voices that her famly has some similar exercise equipment she can use.             Nutrition & Weight - Outcomes:   Post Biometrics - 02/13/23 1110        Post  Biometrics   Waist Circumference --   Pt cannot stand to measure   Hip Circumference --   Pt cannot stand to measure   Triceps Skinfold 45 mm    % Body Fat --   unable to calculate   Grip Strength 28 kg    Flexibility --   Not perfomed due to mobility   Single Leg Stand --   Pt cannot stand unassisted            Nutrition:  Nutrition Therapy & Goals - 02/19/23 1335       Nutrition Therapy   Diet Heart Healthy Diet    Drug/Food Interactions Statins/Certain Fruits      Personal Nutrition Goals   Nutrition Goal Patient to identify strategies for reducing cardiovascular risk by attending the [employer] education and nutrition series weekly.    Personal Goal #2 Patient to improve diet quality by using the plate method as a guide for meal planning to include lean protein/plant protein, fruits, vegetables, whole grains, nonfat dairy as part of a well-balanced diet.    Personal Goal #3 Patient to reduce sodium intake to 1500mg  per day.    Personal Goal #4 Patient to identify  strategies for weight loss of 0.5-2.0# per week.    Comments Terrica has medical history of HFrEF, A-fib, chronic respiratory failure, OSA, pre-diabetes, hyperlipidemia, COPD, peripheral vascular disease. She started Wegovy  ~August 2024. She does report some concerns of constipation. She reports making some dietary changes including increased high fiber foods and reading food labels for sodium. Her A1c remains in a pre-diabetic range. No recent lipid panel; but historically controlled from 2023. Patient will benefit from participation in intensive cardiac rehab for nutrition, exercise, and lifestyle modification.      Intervention Plan   Intervention Prescribe, educate and counsel regarding individualized specific dietary modifications aiming towards targeted core components such as weight, hypertension, lipid management, diabetes, heart failure and other comorbidities.;Nutrition handout(s) given to patient.    Expected Outcomes Short Term Goal: Understand basic principles of dietary content, such as calories, fat, sodium, cholesterol and nutrients.;Long Term Goal: Adherence to prescribed nutrition plan.             Nutrition Discharge:  Nutrition Assessments - 02/28/23 1553       Rate Your Plate Scores   Pre Score 34             Education Questionnaire Score:  Knowledge Questionnaire Score - 05/11/23 1627       Knowledge Questionnaire Score   Post Score 22/24             Goals reviewed with patient; copy given to patient. Reena graduates from  Intensive/Traditional cardiac rehab program on 05/09/23 with completion of 26 exercise and  21 education sessions. Pt maintained good attendance and progressed nicely during their participation in rehab as evidenced by increased MET level. Kimoni increased her distance on her post exercise walk test by 567 feet.  Medication list reconciled. Repeat  PHQ score- 3 .  Pt has made significant lifestyle changes and should be commended for her  success.  Reena  achieved their goals during cardiac rehab.  Pt plans to continue exercise at home watching [employer] videos . Audia says she is feels a lot stronger since participating in cardiac rehab. Adelaide has gained weight since starting cardiac rehab. Not sure if Joannah's initial weight was accurate as Reena cannot stand up straight. We are proud of Charlie's progress!Hadassah Elpidio Quan RN BSN

## 2023-05-14 ENCOUNTER — Encounter: Payer: 59 | Admitting: Internal Medicine

## 2023-05-14 ENCOUNTER — Inpatient Hospital Stay: Payer: 59 | Attending: Hematology and Oncology | Admitting: Hematology and Oncology

## 2023-05-14 VITALS — BP 171/75 | HR 95 | Temp 98.0°F | Resp 19 | Ht 74.0 in | Wt 348.9 lb

## 2023-05-14 DIAGNOSIS — Z170 Estrogen receptor positive status [ER+]: Secondary | ICD-10-CM | POA: Insufficient documentation

## 2023-05-14 DIAGNOSIS — E669 Obesity, unspecified: Secondary | ICD-10-CM | POA: Insufficient documentation

## 2023-05-14 DIAGNOSIS — Z79811 Long term (current) use of aromatase inhibitors: Secondary | ICD-10-CM | POA: Diagnosis not present

## 2023-05-14 DIAGNOSIS — E876 Hypokalemia: Secondary | ICD-10-CM | POA: Insufficient documentation

## 2023-05-14 DIAGNOSIS — I509 Heart failure, unspecified: Secondary | ICD-10-CM | POA: Diagnosis not present

## 2023-05-14 DIAGNOSIS — Z79899 Other long term (current) drug therapy: Secondary | ICD-10-CM | POA: Diagnosis not present

## 2023-05-14 DIAGNOSIS — C50411 Malignant neoplasm of upper-outer quadrant of right female breast: Secondary | ICD-10-CM | POA: Diagnosis not present

## 2023-05-14 MED ORDER — LETROZOLE 2.5 MG PO TABS: 2.5000 mg | ORAL_TABLET | Freq: Every day | ORAL | 3 refills | Status: AC

## 2023-05-14 NOTE — Assessment & Plan Note (Signed)
 03/11/2021:Bilateral breast pain for 3-4 months. Diagnostic mammogram: irregular mass in the anterior aspect of the right breast laterally at 10:00 1.7 cm.  Ultrasound: 2 masses 1.7 cm and 0.9 cm biopsy: Grade 2 invasive ductal carcinoma and DCIS, ER/PR+(100%)/Her2-.  Ki-67 5%   05/02/2020:Right lumpectomy: Grade 2 IDC 1.4 cm with low-grade to intermediate grade DCIS, margins negative, ER 100%, PR 100%, HER2 negative, Ki-67 5%   Pathology counseling: I discussed the final pathology report of the patient provided  a copy of this report. I discussed the margins as well as lymph node surgeries. We also discussed the final staging along with previously performed ER/PR and HER-2/neu testing.   Treatment plan: 1. patient is not a candidate for chemotherapy given her multiple health issues and performance status. 2. not a candidate for adjuvant radiation therapy: Due to her body stature and lung issues  3. Adjuvant antiestrogen therapy with letrozole  started prior to surgery and she appears to be tolerating it well.   Obesity: She will discuss with her primary care clinic about using weight loss medications.  I strongly suggest that she try 1 of these medications to help lose weight.   Breast cancer surveillance: Mammogram 03/03/2022: Benign breast density category B Breast exam 05/14/2023: Benign Return to clinic in 1 year for follow-up

## 2023-05-14 NOTE — Progress Notes (Signed)
 Patient Care Team: Atway, Rayann N, DO as PCP - General Katheryne Pane Frederico Jan, MD as PCP - Cardiology (Cardiology) Caralyn Chandler, MD as Consulting Physician (General Surgery) Cameron Cea, MD as Consulting Physician (Hematology and Oncology) Johna Myers, MD as Consulting Physician (Radiation Oncology) Alane Hsu, RN as Oncology Nurse Navigator Auther Bo, RN as Oncology Nurse Navigator Augustin Leber, RN as [facility] Care Management  DIAGNOSIS:  Encounter Diagnosis  Name Primary?   Malignant neoplasm of upper-outer quadrant of right breast in female, estrogen receptor positive (HCC) Yes    SUMMARY OF ONCOLOGIC HISTORY: Oncology History  Malignant neoplasm of upper-outer quadrant of right breast in female, estrogen receptor positive (HCC)  03/11/2021 Initial Diagnosis   Bilateral breast pain for 3-4 months. Diagnostic mammogram: irregular mass in the anterior aspect of the right breast laterally at 10:00 1.7 cm.  Ultrasound: 2 masses 1.7 cm and 0.9 cm biopsy: Grade 2 invasive ductal carcinoma and DCIS, ER/PR+(100%)/Her2-.  Ki-67 5%   03/23/2021 Cancer Staging   Staging form: Breast, AJCC 8th Edition - Clinical stage from 03/23/2021: Stage IA (cT1c, cN0, cM0, G2, ER+, PR+, HER2-) - Signed by Cameron Cea, MD on 03/23/2021 Stage prefix: Initial diagnosis Histologic grading system: 3 grade system   04/2021 -  Anti-estrogen oral therapy   Letrozole  daily   05/02/2021 Surgery   Right lumpectomy: Grade 2 IDC 1.4 cm with low-grade to intermediate grade DCIS, margins negative, ER 100%, PR 100%, HER2 negative, Ki-67 5%   05/02/2021 Cancer Staging   Staging form: Breast, AJCC 8th Edition - Pathologic stage from 05/02/2021: Stage IA (pT1c, pN0, cM0, G2, ER+, PR+, HER2-) - Signed by Percival Brace, NP on 11/07/2021 Stage prefix: Initial diagnosis Histologic grading system: 3 grade system     CHIEF COMPLIANT: Follow-up of history of breast cancer  HISTORY OF PRESENT  ILLNESS: History of Present Illness   Sharon Vasquez is a 73 year old female who presents for follow-up after completing heart therapy.  She has been on letrozole  for three years as part of her breast cancer treatment without any issues. A mammogram in January was normal, and her breast tissue is not dense, which is favorable for mammogram accuracy.  Approximately five to six months ago, she was hospitalized and required intubation. Following this, she was transferred to a nursing home where her heart function improved significantly, with her ejection fraction increasing from 20% to 60%. This improvement led to her participation in a heart therapy program at [facility], from which she recently graduated. She notes that her progress has been good, and she has not experienced significant problems since then.  She experiences occasional shortness of breath, which she attributes to weight fluctuations. She initially lost weight using Wegovy  and has since gained some back during the holidays. She continues to take Wegovy  and reports that it has been helpful in managing her weight.  She has a history of low potassium levels, for which she was prescribed potassium supplements twice daily. Her potassium levels have since normalized, and she takes the supplement as needed. She mentions feeling better and not experiencing shortness of breath as frequently as before.         ALLERGIES:  has no known allergies.  MEDICATIONS:  Current Outpatient Medications  Medication Sig Dispense Refill   albuterol  (VENTOLIN  HFA) 108 (90 Base) MCG/ACT inhaler Inhale 2 puffs into the lungs every 4 (four) hours as needed for shortness of breath. 18 g 1   alendronate  (FOSAMAX ) 70  MG tablet Take 70 mg by mouth once a week. Take with a full glass of water on an empty stomach, Patient hasn't started medication.     aspirin  EC 81 MG tablet Take 81 mg by mouth daily.     atorvastatin  (LIPITOR ) 80 MG tablet Take 1 tablet (80 mg  total) by mouth daily. 100 tablet 2   empagliflozin  (JARDIANCE ) 10 MG TABS tablet Take 1 tablet (10 mg total) by mouth daily before breakfast. 30 tablet 2   esomeprazole  (NEXIUM ) 40 MG capsule Take 1 capsule (40 mg total) by mouth daily as needed (for heartburn or indigestion). (Patient taking differently: Take 40 mg by mouth daily.) 90 capsule 1   Fluticasone -Umeclidin-Vilant (TRELEGY ELLIPTA ) 100-62.5-25 MCG/ACT AEPB Inhale 1 puff into the lungs daily. 60 each 5   gabapentin  (NEURONTIN ) 400 MG capsule Take 1 capsule (400 mg total) by mouth 2 (two) times daily. 60 capsule 2   letrozole  (FEMARA ) 2.5 MG tablet Take 1 tablet (2.5 mg total) by mouth daily. TAKE 1 TABLET(2.5 MG) BY MOUTH DAILY 90 tablet 3   LORazepam  (ATIVAN ) 0.5 MG tablet Take 0.5 mg by mouth daily as needed for anxiety.     losartan  (COZAAR ) 25 MG tablet Take 1 tablet (25 mg total) by mouth daily. 30 tablet 11   metoprolol  succinate (TOPROL -XL) 25 MG 24 hr tablet Take 1 tablet (25 mg total) by mouth daily. 100 tablet 2   naloxone (NARCAN) nasal spray 4 mg/0.1 mL Place 1 spray into the nose once as needed (for overdose suspected). for opioid overdose     oxybutynin  (DITROPAN ) 5 MG tablet TAKE ONE TABLET BY MOUTH THREE TIMES DAILY 90 tablet 1   oxyCODONE  (ROXICODONE ) 15 MG immediate release tablet Take 15 mg by mouth See admin instructions. Take 15mg  (1 tablet) by mouth every morning and 15mg  (1 tablet) at bedtime in addition to twice during the day as needed.     OXYGEN  Inhale 2-3 L into the lungs daily.     potassium chloride  SA (KLOR-CON  M) 20 MEQ tablet Take 2 tablets (40 mEq total) by mouth 2 (two) times daily. 90 tablet 3   PRESCRIPTION MEDICATION Inhale into the lungs at bedtime. cpap     rivaroxaban  (XARELTO ) 20 MG TABS tablet Take 1 tablet (20 mg total) by mouth every morning. 90 tablet 3   Semaglutide -Weight Management (WEGOVY ) 1 MG/0.5ML SOAJ Inject 1 mg into the skin once a week. 2 mL 3   spironolactone  (ALDACTONE ) 25 MG  tablet Take 1 tablet (25 mg total) by mouth daily. 90 tablet 3   torsemide  (DEMADEX ) 20 MG tablet Take 3 tablets (60 mg total) by mouth daily. 90 tablet 3   No current facility-administered medications for this visit.    PHYSICAL EXAMINATION: ECOG PERFORMANCE STATUS: 1 - Symptomatic but completely ambulatory  Vitals:   05/14/23 0831 05/14/23 0837  BP: (!) 163/107 (!) 171/75  Pulse: 95   Resp: 19   Temp: 98 F (36.7 C)   SpO2: 98%    Filed Weights   05/14/23 0831  Weight: (!) 348 lb 14.4 oz (158.3 kg)     LABORATORY DATA:  I have reviewed the data as listed    Latest Ref Rng & Units 03/21/2023   12:09 PM 03/07/2023   10:38 AM 02/07/2023    8:54 PM  CMP  Glucose 70 - 99 mg/dL 94  89  96   BUN 8 - 27 mg/dL 15  11  12    Creatinine 0.57 -  1.00 mg/dL 1.61  0.96  0.45   Sodium 134 - 144 mmol/L 141  140  139   Potassium 3.5 - 5.2 mmol/L 4.1  3.8  3.5   Chloride 96 - 106 mmol/L 104  106  105   CO2 20 - 29 mmol/L 22  27  19    Calcium  8.7 - 10.3 mg/dL 9.2  8.9  9.2     Lab Results  Component Value Date   WBC 6.1 02/07/2023   HGB 13.8 02/07/2023   HCT 45.8 02/07/2023   MCV 95.2 02/07/2023   PLT 429 (H) 02/07/2023   NEUTROABS 3.3 12/26/2022    ASSESSMENT & PLAN:  Malignant neoplasm of upper-outer quadrant of right breast in female, estrogen receptor positive (HCC) 03/11/2021:Bilateral breast pain for 3-4 months. Diagnostic mammogram: irregular mass in the anterior aspect of the right breast laterally at 10:00 1.7 cm.  Ultrasound: 2 masses 1.7 cm and 0.9 cm biopsy: Grade 2 invasive ductal carcinoma and DCIS, ER/PR+(100%)/Her2-.  Ki-67 5%   05/02/2020:Right lumpectomy: Grade 2 IDC 1.4 cm with low-grade to intermediate grade DCIS, margins negative, ER 100%, PR 100%, HER2 negative, Ki-67 5%   Treatment plan: 1. patient is not a candidate for chemotherapy given her multiple health issues and performance status. 2. not a candidate for adjuvant radiation therapy: Due to her body  stature and lung issues  3. Adjuvant antiestrogen therapy with letrozole  started prior to surgery and she appears to be tolerating it well.   Obesity: She will discuss with her primary care clinic about using weight loss medications.  I strongly suggest that she try 1 of these medications to help lose weight.   Breast cancer surveillance: Mammogram 03/03/2022: Benign breast density category B Breast exam 05/14/2023: Benign Return to clinic in 1 year for follow-up ------------------------------------- Assessment and Plan    Breast Cancer On letrozole  for three years without issues. Recent mammogram in January was normal, with no dense breast tissue noted. Discussed the importance of continuing letrozole  and annual mammograms. - Continue letrozole  - Order annual mammogram  Heart Failure Ejection fraction improved from 20% to 60%. Recently completed heart therapy at [facility] with good progress. Reports occasional shortness of breath, which has improved significantly. Discussed the importance of continuing heart failure management and monitoring symptoms. - Continue current heart failure management - Monitor for symptoms of shortness of breath  Hypokalemia Previously low potassium levels managed with potassium supplements twice daily. Levels are now normal. Discussed the importance of continuing to monitor potassium levels and taking supplements as needed. - Continue monitoring potassium levels - Take potassium supplements as needed  Obesity Significant weight loss from 388 lbs to approximately 340 lbs using Wegovy . Gained some weight during the holidays. Discussed the benefits of continuing Wegovy  for weight management. - Continue Wegovy  for weight management  General Health Maintenance Discussed the importance of continuing annual mammograms and scheduling follow-up in one year. - Continue annual mammograms - Schedule follow-up in one year.          No orders of the defined  types were placed in this encounter.  The patient has a good understanding of the overall plan. she agrees with it. she will call with any problems that may develop before the next visit here. Total time spent: 30 mins including face to face time and time spent for planning, charting and co-ordination of care   Viinay K Mirca Yale, MD 05/14/23

## 2023-05-18 ENCOUNTER — Encounter: Payer: Self-pay | Admitting: Internal Medicine

## 2023-05-18 ENCOUNTER — Ambulatory Visit: Payer: 59 | Admitting: Internal Medicine

## 2023-05-18 VITALS — BP 119/73 | HR 89 | Temp 97.7°F | Ht 74.0 in

## 2023-05-18 DIAGNOSIS — Z6841 Body Mass Index (BMI) 40.0 and over, adult: Secondary | ICD-10-CM

## 2023-05-18 DIAGNOSIS — I5023 Acute on chronic systolic (congestive) heart failure: Secondary | ICD-10-CM

## 2023-05-18 DIAGNOSIS — I739 Peripheral vascular disease, unspecified: Secondary | ICD-10-CM

## 2023-05-18 DIAGNOSIS — I5020 Unspecified systolic (congestive) heart failure: Secondary | ICD-10-CM

## 2023-05-18 DIAGNOSIS — I872 Venous insufficiency (chronic) (peripheral): Secondary | ICD-10-CM | POA: Diagnosis not present

## 2023-05-18 DIAGNOSIS — R7303 Prediabetes: Secondary | ICD-10-CM | POA: Diagnosis not present

## 2023-05-18 DIAGNOSIS — I4891 Unspecified atrial fibrillation: Secondary | ICD-10-CM | POA: Diagnosis not present

## 2023-05-18 DIAGNOSIS — J432 Centrilobular emphysema: Secondary | ICD-10-CM

## 2023-05-18 LAB — POCT GLYCOSYLATED HEMOGLOBIN (HGB A1C): Hemoglobin A1C: 6.1 % — AB (ref 4.0–5.6)

## 2023-05-18 LAB — GLUCOSE, CAPILLARY: Glucose-Capillary: 106 mg/dL — ABNORMAL HIGH (ref 70–99)

## 2023-05-18 MED ORDER — SEMAGLUTIDE-WEIGHT MANAGEMENT 2.4 MG/0.75ML ~~LOC~~ SOAJ: 2.4000 mg | SUBCUTANEOUS | 1 refills | Status: DC

## 2023-05-18 MED ORDER — SEMAGLUTIDE-WEIGHT MANAGEMENT 1.7 MG/0.75ML ~~LOC~~ SOAJ: 1.7000 mg | SUBCUTANEOUS | 0 refills | Status: DC

## 2023-05-18 NOTE — Progress Notes (Signed)
CC: chronic medical conditions  HPI:  [name] is a 73 y.o. female living with a history stated below and presents today for a routine follow-up of her chronic medical conditions. Please see problem based assessment and plan for additional details.  Past Medical History:  Diagnosis Date   A-fib (HCC)    Acute hypoxemic respiratory failure (HCC) 02/20/2022   Acute on chronic HFrEF (heart failure with reduced ejection fraction) (HCC) 10/28/2022   Acute on chronic hypoxic respiratory failure (HCC) 01/04/2022   Acute upper respiratory infection 01/08/2022   Anxiety    Arthritis    "qwhre; joints, back" (04/17/2017)   Atrial fibrillation (HCC)    Benign breast cyst in female, left 01/08/2017   Found by Screening mammogram, evaluated by [facility] on 01/08/17 and determined to be a benign simple breast cyst.   Breast cancer (HCC)    Cellulitis of left lower leg 05/30/2017   CHF (congestive heart failure) (HCC)    Chronic low back pain 08/21/2016   Chronic lower back pain    Chronic venous insufficiency    /notes 05/30/2017   COPD (chronic obstructive pulmonary disease) (HCC)    Depression    Diabetes (HCC) 01/08/2022   Diabetes mellitus without complication (HCC)    DVT (deep venous thrombosis) (HCC) 11/16/2016   Dysrhythmia    Esophageal dysmotility 11/10/2019   Previous workup by ENT with fiberoptic leryngoscopy which was normal. Dx with laryngeal pharyngeal reflux.   GERD (gastroesophageal reflux disease)    Headache    "weekly for the last 3 months" (04/17/2017)   History of pulmonary embolism    Pulmonary embolism   Hyperlipidemia    Hypertension    Laryngopharyngeal reflux 12/18/2015   Morbid obesity (HCC)    PE (pulmonary embolism)    Pulmonary embolism (HCC) 09/21/2014   Right foot pain 03/03/2021   Sleep apnea    Stroke (HCC) 12/02/2021    Current Outpatient Medications on File Prior to Visit  Medication Sig Dispense Refill   albuterol (VENTOLIN HFA) 108 (90  Base) MCG/ACT inhaler Inhale 2 puffs into the lungs every 4 (four) hours as needed for shortness of breath. 18 g 1   alendronate (FOSAMAX) 70 MG tablet Take 70 mg by mouth once a week. Take with a full glass of water on an empty stomach, Patient hasn't started medication.     aspirin EC 81 MG tablet Take 81 mg by mouth daily.     atorvastatin (LIPITOR) 80 MG tablet Take 1 tablet (80 mg total) by mouth daily. 100 tablet 2   empagliflozin (JARDIANCE) 10 MG TABS tablet Take 1 tablet (10 mg total) by mouth daily before breakfast. 30 tablet 2   esomeprazole (NEXIUM) 40 MG capsule Take 1 capsule (40 mg total) by mouth daily as needed (for heartburn or indigestion). (Patient taking differently: Take 40 mg by mouth daily.) 90 capsule 1   Fluticasone-Umeclidin-Vilant (TRELEGY ELLIPTA) 100-62.5-25 MCG/ACT AEPB Inhale 1 puff into the lungs daily. 60 each 5   gabapentin (NEURONTIN) 400 MG capsule Take 1 capsule (400 mg total) by mouth 2 (two) times daily. 60 capsule 2   letrozole (FEMARA) 2.5 MG tablet Take 1 tablet (2.5 mg total) by mouth daily. TAKE 1 TABLET(2.5 MG) BY MOUTH DAILY 90 tablet 3   LORazepam (ATIVAN) 0.5 MG tablet Take 0.5 mg by mouth daily as needed for anxiety.     losartan (COZAAR) 25 MG tablet Take 1 tablet (25 mg total) by mouth daily. 30 tablet 11  metoprolol succinate (TOPROL-XL) 25 MG 24 hr tablet Take 1 tablet (25 mg total) by mouth daily. 100 tablet 2   naloxone (NARCAN) nasal spray 4 mg/0.1 mL Place 1 spray into the nose once as needed (for overdose suspected). for opioid overdose     oxybutynin (DITROPAN) 5 MG tablet TAKE ONE TABLET BY MOUTH THREE TIMES DAILY 90 tablet 1   oxyCODONE (ROXICODONE) 15 MG immediate release tablet Take 15 mg by mouth See admin instructions. Take 15mg  (1 tablet) by mouth every morning and 15mg  (1 tablet) at bedtime in addition to twice during the day as needed.     OXYGEN Inhale 2-3 L into the lungs daily.     potassium chloride SA (KLOR-CON M) 20 MEQ  tablet Take 2 tablets (40 mEq total) by mouth 2 (two) times daily. 90 tablet 3   PRESCRIPTION MEDICATION Inhale into the lungs at bedtime. cpap     rivaroxaban (XARELTO) 20 MG TABS tablet Take 1 tablet (20 mg total) by mouth every morning. 90 tablet 3   spironolactone (ALDACTONE) 25 MG tablet Take 1 tablet (25 mg total) by mouth daily. 90 tablet 3   torsemide (DEMADEX) 20 MG tablet Take 3 tablets (60 mg total) by mouth daily. 90 tablet 3   No current facility-administered medications on file prior to visit.    Family History  Problem Relation Age of Onset   Breast cancer Mother        before age 39   Hypertension Mother    Hyperlipidemia Mother    Hypertension Maternal Grandfather    Hyperlipidemia Maternal Grandfather     Social History   Socioeconomic History   Marital status: Divorced    Spouse name: Not on file   Number of children: Not on file   Years of education: Not on file   Highest education level: Not on file  Occupational History   Not on file  Tobacco Use   Smoking status: Former    Current packs/day: 0.00    Average packs/day: 2.0 packs/day for 60.4 years (120.8 ttl pk-yrs)    Types: Cigarettes    Start date: 08/16/1961    Quit date: 01/02/2022    Years since quitting: 1.3   Smokeless tobacco: Never   Tobacco comments:    Pt smokes about 4 ciggs daily. Stopped 01/02/2022   Vaping Use   Vaping status: Never Used  Substance and Sexual Activity   Alcohol use: No   Drug use: No   Sexual activity: Not Currently    Partners: Male  Other Topics Concern   Not on file  Social History Narrative   Lives in [facility] in [place]. Lives alone, but is dependent in ADLs/IADLs. Previously had HH PT and RN but dismissed them with plans to use the [employer]. Two daughters live nearby. Previously resided in [place].   Social Drivers of Health   Financial Resource Strain: Medium Risk (11/16/2022)   Overall Financial Resource Strain (CARDIA)    Difficulty of  Paying Living Expenses: Somewhat hard  Food Insecurity: No Food Insecurity (12/01/2022)   Hunger Vital Sign    Worried About Running Out of Food in the Last Year: Never true    Ran Out of Food in the Last Year: Never true  Transportation Needs: No Transportation Needs (12/01/2022)   PRAPARE - Administrator, Civil Service (Medical): No    Lack of Transportation (Non-Medical): No  Physical Activity: Inactive (11/16/2022)   Exercise Vital Sign  Days of Exercise per Week: 0 days    Minutes of Exercise per Session: 0 min  Stress: No Stress Concern Present (12/02/2021)   [employer] of Occupational Health - Occupational Stress Questionnaire    Feeling of Stress : Only a little  Social Connections: Moderately Integrated (03/15/2022)   Social Connection and Isolation Panel [NHANES]    Frequency of Communication with Friends and Family: More than three times a week    Frequency of Social Gatherings with Friends and Family: More than three times a week    Attends Religious Services: More than 4 times per year    Active Member of [employer] or Organizations: Yes    Attends Engineer, structural: More than 4 times per year    Marital Status: Divorced  Intimate Partner Violence: Not At Risk (12/01/2022)   Humiliation, Afraid, Rape, and Kick questionnaire    Fear of Current or Ex-Partner: No    Emotionally Abused: No    Physically Abused: No    Sexually Abused: No    Review of Systems: ROS negative except for what is noted on the assessment and plan.  Vitals:   05/18/23 1033  BP: 119/73  Pulse: 89  Temp: 97.7 F (36.5 C)  TempSrc: Oral  SpO2: 98%  Height: 6\' 2"  (1.88 m)    Physical Exam: Constitutional: chronically ill appearing, obese female in wheelchair Cardiovascular: regular rate and irregular rhythm Pulmonary/Chest: normal work of breathing on room air MSK: 1+ edema bilateral lower extremities Skin: skin is darkened, thickened, and hardened in lower  extremities but no areas of open ulcerations Psych: normal mood and behavior  Assessment & Plan:    Patient discussed with Dr. Mayford Knife  Chronic venous insufficiency The patient has a previous history of venoplasty and stenting of the left common iliac and external iliac veins.  She usually does well with Unna boots, and last had them in December, but was lost to follow-up with the holidays, thus had to take them off on her own.  Today, her skin remains darkened, thickened, and hardened in her lower extremities, but she has no areas of open ulcerations.  Plan: -Unna boots today - Follow-up in 1 week to replace Unna boots  Heart failure with reduced ejection fraction (HCC) Most recent echo with improved EF from 20% to 55 to 60%.  Blood pressure is well-controlled at 119/73 today.  July, the patient completed cardiac rehab.  Prior to starting cardiac rehab she was able to take 55 steps per minute and walked a distance of 1017 feet.  After she completed her intense cardiac rehab she was able to walk 89 steps per minute with a distance of 1584 feet.  Congratulated the patient on completing cardiac rehab.  Plan: - Continue Jardiance 10 mg daily, losartan 25 mg daily, metoprolol 25 mg daily, torsemide 60 mg daily, spironolactone 25 mg daily - Continue to follow-up with cardiology -Referral to vestibular rehab, as recommended by cardiac rehab  Morbid obesity (HCC) The patient has been taking Wegovy 1 mg/week.  Her weight has been as high as 360 pounds in the past, and today it is 348 pounds.  Her weight loss has seemed to stall on the 1 mg/week dose of Wegovy, thus we will increase to 1.7 mg/week dose x 4 weeks followed by 2.4 mg/week dose thereafter  Prediabetes A1c slightly increased from 5.8% to 6.1%.  Continue Jardiance 10 mg daily and increase Wegovy as noted in assessment/plan for morbid obesity.  Atrial fibrillation with  controlled ventricular response (HCC) Remains in A-fib, but is  rate controlled on metoprolol 25 mg daily.  She also takes Xarelto 20 mg daily for anticoagulation.   Elza Rafter, D.O. [facility] Internal Medicine, PGY-3 Phone: [phone] Date 05/18/2023 Time 11:55 AM

## 2023-05-18 NOTE — Patient Instructions (Signed)
Thank you, Ms.[name] for allowing [place] to provide your care today. Today we discussed:  Heart failure No changes to medications Referred to vestibular rehab  Weight  loss Increase Wegovy to 1.7 mg/week for 4 weeks and then 2.4 mg/week  Unna boots placed today - follow up in 1 week  I have ordered the following labs for you:   Lab Orders         Glucose, capillary         Microalbumin / Creatinine Urine Ratio         BMP8+Anion Gap         POC Hbg A1C       Referrals ordered today:   Referral Orders  No referral(s) requested today     I have ordered the following medication/changed the following medications:   Stop the following medications: Medications Discontinued During This Encounter  Medication Reason   Semaglutide-Weight Management (WEGOVY) 1 MG/0.5ML SOAJ      Start the following medications: Meds ordered this encounter  Medications   Semaglutide-Weight Management 1.7 MG/0.75ML SOAJ    Sig: Inject 1.7 mg into the skin once a week for 28 days.    Dispense:  3 mL    Refill:  0   Semaglutide-Weight Management 2.4 MG/0.75ML SOAJ    Sig: Inject 2.4 mg into the skin once a week for 28 days.    Dispense:  3 mL    Refill:  1     Follow up:  1 week      Should you have any questions or concerns please call the internal medicine clinic at [phone].     Elza Rafter, D.O. [facility]

## 2023-05-18 NOTE — Assessment & Plan Note (Signed)
A1c slightly increased from 5.8% to 6.1%.  Continue Jardiance 10 mg daily and increase Wegovy as noted in assessment/plan for morbid obesity.

## 2023-05-18 NOTE — Assessment & Plan Note (Signed)
The patient has a previous history of venoplasty and stenting of the left common iliac and external iliac veins.  She usually does well with Unna boots, and last had them in December, but was lost to follow-up with the holidays, thus had to take them off on her own.  Today, her skin remains darkened, thickened, and hardened in her lower extremities, but she has no areas of open ulcerations.  Plan: -Unna boots today - Follow-up in 1 week to replace Unna boots

## 2023-05-18 NOTE — Assessment & Plan Note (Addendum)
Most recent echo with improved EF from 20% to 55 to 60%.  Blood pressure is well-controlled at 119/73 today.  July, the patient completed cardiac rehab.  Prior to starting cardiac rehab she was able to take 55 steps per minute and walked a distance of 1017 feet.  After she completed her intense cardiac rehab she was able to walk 89 steps per minute with a distance of 1584 feet.  Congratulated the patient on completing cardiac rehab.  Plan: - Continue Jardiance 10 mg daily, losartan 25 mg daily, metoprolol 25 mg daily, torsemide 60 mg daily, spironolactone 25 mg daily - Continue to follow-up with cardiology -Referral to vestibular rehab, as recommended by cardiac rehab

## 2023-05-18 NOTE — Assessment & Plan Note (Addendum)
The patient has been taking Wegovy 1 mg/week.  Her weight has been as high as 360 pounds in the past, and today it is 348 pounds.  Her weight loss has seemed to stall on the 1 mg/week dose of Wegovy, thus we will increase to 1.7 mg/week dose x 4 weeks followed by 2.4 mg/week dose thereafter

## 2023-05-18 NOTE — Assessment & Plan Note (Signed)
Remains in A-fib, but is rate controlled on metoprolol 25 mg daily.  She also takes Xarelto 20 mg daily for anticoagulation.

## 2023-05-19 LAB — MICROALBUMIN / CREATININE URINE RATIO
Creatinine, Urine: 121.3 mg/dL
Microalb/Creat Ratio: 8 mg/g{creat} (ref 0–29)
Microalbumin, Urine: 10.3 ug/mL

## 2023-05-20 LAB — BMP8+ANION GAP
Anion Gap: 19 mmol/L — ABNORMAL HIGH (ref 10.0–18.0)
BUN/Creatinine Ratio: 18 (ref 12–28)
BUN: 17 mg/dL (ref 8–27)
CO2: 22 mmol/L (ref 20–29)
Calcium: 9.7 mg/dL (ref 8.7–10.3)
Chloride: 99 mmol/L (ref 96–106)
Creatinine, Ser: 0.94 mg/dL (ref 0.57–1.00)
Glucose: 89 mg/dL (ref 70–99)
Potassium: 4.3 mmol/L (ref 3.5–5.2)
Sodium: 140 mmol/L (ref 134–144)
eGFR: 64 mL/min/{1.73_m2} (ref >59)

## 2023-05-21 ENCOUNTER — Encounter: Payer: Self-pay | Admitting: Internal Medicine

## 2023-05-21 DIAGNOSIS — J449 Chronic obstructive pulmonary disease, unspecified: Secondary | ICD-10-CM | POA: Diagnosis not present

## 2023-05-23 DIAGNOSIS — G4733 Obstructive sleep apnea (adult) (pediatric): Secondary | ICD-10-CM | POA: Diagnosis not present

## 2023-05-23 DIAGNOSIS — J9611 Chronic respiratory failure with hypoxia: Secondary | ICD-10-CM | POA: Diagnosis not present

## 2023-05-23 DIAGNOSIS — M25561 Pain in right knee: Secondary | ICD-10-CM | POA: Diagnosis not present

## 2023-05-23 DIAGNOSIS — G8929 Other chronic pain: Secondary | ICD-10-CM | POA: Diagnosis not present

## 2023-05-23 DIAGNOSIS — C50911 Malignant neoplasm of unspecified site of right female breast: Secondary | ICD-10-CM | POA: Diagnosis not present

## 2023-05-23 DIAGNOSIS — J449 Chronic obstructive pulmonary disease, unspecified: Secondary | ICD-10-CM | POA: Diagnosis not present

## 2023-05-23 DIAGNOSIS — I509 Heart failure, unspecified: Secondary | ICD-10-CM | POA: Diagnosis not present

## 2023-05-23 DIAGNOSIS — J9610 Chronic respiratory failure, unspecified whether with hypoxia or hypercapnia: Secondary | ICD-10-CM | POA: Diagnosis not present

## 2023-05-23 DIAGNOSIS — R2681 Unsteadiness on feet: Secondary | ICD-10-CM | POA: Diagnosis not present

## 2023-05-23 DIAGNOSIS — J9601 Acute respiratory failure with hypoxia: Secondary | ICD-10-CM | POA: Diagnosis not present

## 2023-05-23 DIAGNOSIS — M25562 Pain in left knee: Secondary | ICD-10-CM | POA: Diagnosis not present

## 2023-05-23 DIAGNOSIS — M170 Bilateral primary osteoarthritis of knee: Secondary | ICD-10-CM | POA: Diagnosis not present

## 2023-05-23 DIAGNOSIS — Z79899 Other long term (current) drug therapy: Secondary | ICD-10-CM | POA: Diagnosis not present

## 2023-05-23 DIAGNOSIS — I4891 Unspecified atrial fibrillation: Secondary | ICD-10-CM | POA: Diagnosis not present

## 2023-05-23 DIAGNOSIS — E119 Type 2 diabetes mellitus without complications: Secondary | ICD-10-CM | POA: Diagnosis not present

## 2023-05-23 DIAGNOSIS — J439 Emphysema, unspecified: Secondary | ICD-10-CM | POA: Diagnosis not present

## 2023-05-23 NOTE — Progress Notes (Signed)
Called about normal BMP and urine micro. No change in management.

## 2023-05-25 ENCOUNTER — Ambulatory Visit: Payer: 59 | Admitting: Internal Medicine

## 2023-05-25 VITALS — BP 115/57 | HR 85 | Temp 98.6°F | Ht 74.0 in

## 2023-05-25 DIAGNOSIS — Z6841 Body Mass Index (BMI) 40.0 and over, adult: Secondary | ICD-10-CM | POA: Diagnosis not present

## 2023-05-25 DIAGNOSIS — Z122 Encounter for screening for malignant neoplasm of respiratory organs: Secondary | ICD-10-CM

## 2023-05-25 DIAGNOSIS — M810 Age-related osteoporosis without current pathological fracture: Secondary | ICD-10-CM

## 2023-05-25 DIAGNOSIS — I872 Venous insufficiency (chronic) (peripheral): Secondary | ICD-10-CM

## 2023-05-25 DIAGNOSIS — I5022 Chronic systolic (congestive) heart failure: Secondary | ICD-10-CM | POA: Diagnosis not present

## 2023-05-25 NOTE — Assessment & Plan Note (Signed)
The patient was started on Wegovy 1.7 mg/week last week.  Will continue on this current dose for 3 more weeks, and then will increase to the 2.4 mg/week dose thereafter.

## 2023-05-25 NOTE — Progress Notes (Signed)
CC: venous insufficiency  HPI:  Sharon Vasquez is a 73 y.o. female living with a history stated below and presents today for a 1 week follow up of chronic venous insufficiency. Please see problem based assessment and plan for additional details.  Past Medical History:  Diagnosis Date   A-fib (HCC)    Acute cystitis without hematuria 11/02/2022   Acute hypoxemic respiratory failure (HCC) 02/20/2022   Acute on chronic HFrEF (heart failure with reduced ejection fraction) (HCC) 10/28/2022   Acute on chronic hypoxic respiratory failure (HCC) 01/04/2022   Acute upper respiratory infection 01/08/2022   Anxiety    Arthritis    "qwhre; joints, back" (04/17/2017)   Atrial fibrillation (HCC)    Benign breast cyst in female, left 01/08/2017   Found by Screening mammogram, evaluated by U/S on 01/08/17 and determined to be a benign simple breast cyst.   Breast cancer (HCC)    Cellulitis of left lower leg 05/30/2017   CHF (congestive heart failure) (HCC)    Chronic low back pain 08/21/2016   Chronic lower back pain    Chronic venous insufficiency    /notes 05/30/2017   COPD (chronic obstructive pulmonary disease) (HCC)    Depression    Diabetes (HCC) 01/08/2022   Diabetes mellitus without complication (HCC)    DVT (deep venous thrombosis) (HCC) 11/16/2016   Dysrhythmia    Esophageal dysmotility 11/10/2019   Previous workup by ENT with fiberoptic leryngoscopy which was normal. Dx with laryngeal pharyngeal reflux.   Foul smelling urine 05/04/2022   GERD (gastroesophageal reflux disease)    Headache    "weekly for the last 3 months" (04/17/2017)   History of hypokalemia 11/17/2022   History of pulmonary embolism    Pulmonary embolism   Hyperlipidemia    Hypertension    Hypokalemia 12/01/2022   Laryngopharyngeal reflux 12/18/2015   Morbid obesity (HCC)    PE (pulmonary embolism)    Pulmonary embolism (HCC) 09/21/2014   Right foot pain 03/03/2021   Sleep apnea    Stroke (HCC)  12/02/2021   Vocal cord polyp 12/18/2015    Current Outpatient Medications on File Prior to Visit  Medication Sig Dispense Refill   albuterol (VENTOLIN HFA) 108 (90 Base) MCG/ACT inhaler Inhale 2 puffs into the lungs every 4 (four) hours as needed for shortness of breath. 18 g 1   alendronate (FOSAMAX) 70 MG tablet Take 70 mg by mouth once a week. Take with a full glass of water on an empty stomach, Patient hasn't started medication.     aspirin EC 81 MG tablet Take 81 mg by mouth daily.     atorvastatin (LIPITOR) 80 MG tablet Take 1 tablet (80 mg total) by mouth daily. 100 tablet 2   empagliflozin (JARDIANCE) 10 MG TABS tablet Take 1 tablet (10 mg total) by mouth daily before breakfast. 30 tablet 2   esomeprazole (NEXIUM) 40 MG capsule Take 1 capsule (40 mg total) by mouth daily as needed (for heartburn or indigestion). (Patient taking differently: Take 40 mg by mouth daily.) 90 capsule 1   Fluticasone-Umeclidin-Vilant (TRELEGY ELLIPTA) 100-62.5-25 MCG/ACT AEPB Inhale 1 puff into the lungs daily. 60 each 5   gabapentin (NEURONTIN) 400 MG capsule Take 1 capsule (400 mg total) by mouth 2 (two) times daily. 60 capsule 2   letrozole (FEMARA) 2.5 MG tablet Take 1 tablet (2.5 mg total) by mouth daily. TAKE 1 TABLET(2.5 MG) BY MOUTH DAILY 90 tablet 3   LORazepam (ATIVAN) 0.5 MG tablet Take 0.5  mg by mouth daily as needed for anxiety.     losartan (COZAAR) 25 MG tablet Take 1 tablet (25 mg total) by mouth daily. 30 tablet 11   metoprolol succinate (TOPROL-XL) 25 MG 24 hr tablet Take 1 tablet (25 mg total) by mouth daily. 100 tablet 2   naloxone (NARCAN) nasal spray 4 mg/0.1 mL Place 1 spray into the nose once as needed (for overdose suspected). for opioid overdose     oxybutynin (DITROPAN) 5 MG tablet TAKE ONE TABLET BY MOUTH THREE TIMES DAILY 90 tablet 1   oxyCODONE (ROXICODONE) 15 MG immediate release tablet Take 15 mg by mouth See admin instructions. Take 15mg  (1 tablet) by mouth every morning and  15mg  (1 tablet) at bedtime in addition to twice during the day as needed.     OXYGEN Inhale 2-3 L into the lungs daily.     potassium chloride SA (KLOR-CON M) 20 MEQ tablet Take 2 tablets (40 mEq total) by mouth 2 (two) times daily. 90 tablet 3   PRESCRIPTION MEDICATION Inhale into the lungs at bedtime. cpap     rivaroxaban (XARELTO) 20 MG TABS tablet Take 1 tablet (20 mg total) by mouth every morning. 90 tablet 3   [START ON 08/13/2023] Semaglutide-Weight Management 1.7 MG/0.75ML SOAJ Inject 1.7 mg into the skin once a week for 28 days. 3 mL 0   [START ON 09/11/2023] Semaglutide-Weight Management 2.4 MG/0.75ML SOAJ Inject 2.4 mg into the skin once a week for 28 days. 3 mL 1   spironolactone (ALDACTONE) 25 MG tablet Take 1 tablet (25 mg total) by mouth daily. 90 tablet 3   torsemide (DEMADEX) 20 MG tablet Take 3 tablets (60 mg total) by mouth daily. 90 tablet 3   No current facility-administered medications on file prior to visit.    Family History  Problem Relation Age of Onset   Breast cancer Mother        before age 35   Hypertension Mother    Hyperlipidemia Mother    Hypertension Maternal Grandfather    Hyperlipidemia Maternal Grandfather     Social History   Socioeconomic History   Marital status: Divorced    Spouse name: Not on file   Number of children: Not on file   Years of education: Not on file   Highest education level: Not on file  Occupational History   Not on file  Tobacco Use   Smoking status: Former    Current packs/day: 0.00    Average packs/day: 2.0 packs/day for 60.4 years (120.8 ttl pk-yrs)    Types: Cigarettes    Start date: 08/16/1961    Quit date: 01/02/2022    Years since quitting: 1.3   Smokeless tobacco: Never   Tobacco comments:    Pt smokes about 4 ciggs daily. Stopped 01/02/2022   Vaping Use   Vaping status: Never Used  Substance and Sexual Activity   Alcohol use: No   Drug use: No   Sexual activity: Not Currently    Partners: Male  Other  Topics Concern   Not on file  Social History Narrative   Lives in [facility] in [place]. Lives alone, but is dependent in ADLs/IADLs. Previously had HH PT and RN but dismissed them with plans to use the [employer]. Two daughters live nearby. Previously resided in [place].   Social Drivers of Health   Financial Resource Strain: Medium Risk (11/16/2022)   Overall Financial Resource Strain (CARDIA)    Difficulty of Paying Living Expenses:  Somewhat hard  Food Insecurity: No Food Insecurity (12/01/2022)   Hunger Vital Sign    Worried About Running Out of Food in the Last Year: Never true    Ran Out of Food in the Last Year: Never true  Transportation Needs: No Transportation Needs (12/01/2022)   PRAPARE - Administrator, Civil Service (Medical): No    Lack of Transportation (Non-Medical): No  Physical Activity: Inactive (11/16/2022)   Exercise Vital Sign    Days of Exercise per Week: 0 days    Minutes of Exercise per Session: 0 min  Stress: No Stress Concern Present (12/02/2021)   [employer] of Occupational Health - Occupational Stress Questionnaire    Feeling of Stress : Only a little  Social Connections: Moderately Integrated (03/15/2022)   Social Connection and Isolation Panel [NHANES]    Frequency of Communication with Friends and Family: More than three times a week    Frequency of Social Gatherings with Friends and Family: More than three times a week    Attends Religious Services: More than 4 times per year    Active Member of [employer] or Organizations: Yes    Attends Engineer, structural: More than 4 times per year    Marital Status: Divorced  Intimate Partner Violence: Not At Risk (12/01/2022)   Humiliation, Afraid, Rape, and Kick questionnaire    Fear of Current or Ex-Partner: No    Emotionally Abused: No    Physically Abused: No    Sexually Abused: No    Review of Systems: ROS negative except for what is noted on the assessment and  plan.  Vitals:   05/25/23 0957  BP: (!) 115/57  Pulse: 85  Temp: 98.6 F (37 C)  TempSrc: Oral  SpO2: 93%  Height: 6\' 2"  (1.88 m)    Physical Exam: Constitutional: no acute distress Cardiovascular: regular rate, irregular rhythm Pulmonary/Chest: normal work of breathing on room air MSK: trace edema bilateral lower extremities (R slightly > L, chronic) Skin: skin is dark, thickened, and hardened in lower extremities. No areas of open ulceration or weeping Psych: normal mood and behavior  Assessment & Plan:    Patient discussed with Dr. Mayford Knife  Chronic venous insufficiency The patient presents for 1 week follow-up of her chronic venous insufficiency after having Unna boots placed 1 week ago.  She has a previous history of venoplasty and stenting of the left common iliac and external iliac veins.  On exam, the patient's lower extremity edema is significantly improved.  She does still have edema, and her skin is still dark, thickened, and hardened in her lower extremities, but it is improving.  Plan: - Unna boots today - Follow-up in 1 week  Osteoporosis Patient continues to take alendronate 70 mg once weekly.  She had been on this previously, but it was stopped in 2019, and she was restarted on it in July 2024.  Morbid obesity (HCC) The patient was started on Wegovy 1.7 mg/week last week.  Will continue on this current dose for 3 more weeks, and then will increase to the 2.4 mg/week dose thereafter.   Elza Rafter, D.O. [facility] Internal Medicine, PGY-3 Phone: [phone] Date 05/25/2023 Time 11:41 AM

## 2023-05-25 NOTE — Assessment & Plan Note (Signed)
Patient continues to take alendronate 70 mg once weekly.  She had been on this previously, but it was stopped in 2019, and she was restarted on it in July 2024.

## 2023-05-25 NOTE — Patient Instructions (Signed)
Thank you, Ms.[name] for allowing [place] to provide your care today. Today we discussed:  Colonoscopy: call Kathi Der, MD [phone]  Sleep study: call dr Elwyn Lade: [phone]  Roland Rack boots to be replaced in 1 week  I have ordered the following labs for you:  Lab Orders  No laboratory test(s) ordered today       Referrals ordered today:   Referral Orders  No referral(s) requested today     I have ordered the following medication/changed the following medications:   Stop the following medications: There are no discontinued medications.   Start the following medications: No orders of the defined types were placed in this encounter.    Follow up:  1 week     Should you have any questions or concerns please call the internal medicine clinic at [phone].     Elza Rafter, D.O. [facility]

## 2023-05-25 NOTE — Assessment & Plan Note (Signed)
The patient presents for 1 week follow-up of her chronic venous insufficiency after having Unna boots placed 1 week ago.  She has a previous history of venoplasty and stenting of the left common iliac and external iliac veins.  On exam, the patient's lower extremity edema is significantly improved.  She does still have edema, and her skin is still dark, thickened, and hardened in her lower extremities, but it is improving.  Plan: - Unna boots today - Follow-up in 1 week

## 2023-05-25 NOTE — Progress Notes (Signed)
Applied bilateral unna boots. Pt. Tolerated application well.

## 2023-05-26 DIAGNOSIS — Z79899 Other long term (current) drug therapy: Secondary | ICD-10-CM | POA: Diagnosis not present

## 2023-05-28 NOTE — Progress Notes (Signed)
 Internal Medicine Clinic Attending  Case discussed with the resident at the time of the visit.  We reviewed the resident's history and exam and pertinent patient test results.  I agree with the assessment, diagnosis, and plan of care documented in the resident's note.

## 2023-05-29 ENCOUNTER — Ambulatory Visit: Payer: Self-pay

## 2023-05-29 ENCOUNTER — Other Ambulatory Visit (HOSPITAL_COMMUNITY): Payer: Self-pay | Admitting: Physician Assistant

## 2023-05-29 ENCOUNTER — Other Ambulatory Visit (HOSPITAL_COMMUNITY): Payer: Self-pay | Admitting: Adult Health

## 2023-05-29 DIAGNOSIS — I5020 Unspecified systolic (congestive) heart failure: Secondary | ICD-10-CM

## 2023-05-29 DIAGNOSIS — I482 Chronic atrial fibrillation, unspecified: Secondary | ICD-10-CM

## 2023-05-29 MED ORDER — EMPAGLIFLOZIN 10 MG PO TABS: 10.0000 mg | ORAL_TABLET | Freq: Every day | ORAL | 1 refills | Status: DC

## 2023-05-29 NOTE — Patient Instructions (Signed)
 Visit Information  Thank you for taking time to visit with me today. Please don't hesitate to contact me if I can be of assistance to you.   Following are the goals we discussed today:   Goals Addressed             This Visit's Progress    I want to monitor and control my conditions       Patient Goals/Self Care Activities: -Patient/Caregiver will take medications as prescribed   -Patient/Caregiver will attend all scheduled provider appointments -Patient/Caregiver will call pharmacy for medication refills 3-7 days in advance of running out of medications -Patient/Caregiver will call provider office for new concerns or questions  -Patient/Caregiver will focus on medication adherence by taking medications as prescribed  -Weigh daily and record (notify MD with 3 lb weight gain over night or 5 lb in a week) -Follow CHF Action Plan -Adhere to low sodium diet  Wt Readings from Last 3 Encounters:  05/14/23 (!) 348 lb 14.4 oz (158.3 kg)  05/01/23 (!) 352 lb (159.7 kg)  03/21/23 (!) 350 lb 6.4 oz (158.9 kg)   -Keep your airway clear from mucus build up  -Use a humidifier, if needed -Take your fluid pill as prescribed and monitor your diet -Self assess COPD action plan zone and make appointment with provider if you have been in the yellow zone for 48 hours without improvement.  -go to the balance clinic -coninute with the use of your leg wraps         Our next appointment is by telephone on 06/26/23 at 9 am  Please call the care guide team at [phone] if you need to cancel or reschedule your appointment.   If you are experiencing a Mental Health or Behavioral Health Crisis or need someone to talk to, please call 1-800-273-TALK (toll free, 24 hour hotline)  Patient verbalizes understanding of instructions and care plan provided today and agrees to view in MyChart. Active MyChart status and patient understanding of how to access instructions and care plan via MyChart confirmed  with patient.     Juanell Fairly RN, BSN, [facility]  [facility], [facility]  Care Coordinator Phone: [phone]

## 2023-05-29 NOTE — Patient Outreach (Signed)
 Care Coordination   Follow Up Visit Note   05/29/2023 Name: Sharon Vasquez [name]: [id number] DOB: [birth year]  Sharon Vasquez is a 73 y.o. year old female who sees Atway, Rayann N, DO for primary care. I spoke with  Sharon Vasquez by phone today.  What matters to the patients health and wellness today?  Sharon Vasquez has informed me of her acceptance into the [facility] program. Additionally, she has completed heart therapy, which has contributed to strengthening her heart and legs. Consequently, her physician has referred her for balance therapy, a development she is enthusiastic about. I expressed my pride in her commitment to improving her health.  Sharon Vasquez is diligently adhering to her medication regimen and has acquired a medication organizer to manage doses for two weeks. She has experienced some leg swelling and is currently wearing compression wraps. She reports no chest pain and has ceased smoking as of October 2023. She believes she is on a positive upward trend to better health. While she continues to experience mild shortness of breath, she is actively taking steps to make lifestyle changes.    Goals Addressed             This Visit's Progress    I want to monitor and control my conditions       Patient Goals/Self Care Activities: -Patient/Caregiver will take medications as prescribed   -Patient/Caregiver will attend all scheduled provider appointments -Patient/Caregiver will call pharmacy for medication refills 3-7 days in advance of running out of medications -Patient/Caregiver will call provider office for new concerns or questions  -Patient/Caregiver will focus on medication adherence by taking medications as prescribed  -Weigh daily and record (notify MD with 3 lb weight gain over night or 5 lb in a week) -Follow CHF Action Plan -Adhere to low sodium diet  Wt Readings from Last 3 Encounters:  05/14/23 (!) 348 lb 14.4 oz (158.3 kg)  05/01/23 (!) 352 lb (159.7 kg)  03/21/23 (!)  350 lb 6.4 oz (158.9 kg)   -Keep your airway clear from mucus build up  -Use a humidifier, if needed -Take your fluid pill as prescribed and monitor your diet -Self assess COPD action plan zone and make appointment with provider if you have been in the yellow zone for 48 hours without improvement.  -go to the balance clinic -coninute with the use of your leg wraps         SDOH assessments and interventions completed:  No     Care Coordination Interventions:  Yes, provided   Interventions Today    Flowsheet Row Most Recent Value  Chronic Disease   Chronic disease during today's visit Congestive Heart Failure (CHF), Chronic Obstructive Pulmonary Disease (COPD)  General Interventions   General Interventions Discussed/Reviewed General Interventions Discussed, General Interventions Reviewed  Exercise Interventions   Exercise Discussed/Reviewed Exercise Discussed  Nutrition Interventions   Nutrition Discussed/Reviewed Nutrition Discussed, Fluid intake  Pharmacy Interventions   Pharmacy Dicussed/Reviewed Pharmacy Topics Discussed  Safety Interventions   Safety Discussed/Reviewed Safety Discussed        Follow up plan: Follow up call scheduled for 06/26/23  9 am    Encounter Outcome:  Patient Visit Completed    Juanell Fairly RN, BSN, [facility]  [facility], [facility]  Care Coordinator Phone: [phone]   .

## 2023-06-01 ENCOUNTER — Ambulatory Visit: Payer: 59 | Admitting: Internal Medicine

## 2023-06-01 ENCOUNTER — Ambulatory Visit (HOSPITAL_COMMUNITY)
Admission: RE | Admit: 2023-06-01 | Discharge: 2023-06-01 | Disposition: A | Discharge status: Home / Self Care | Payer: 59 | Source: Ambulatory Visit | Attending: Internal Medicine | Admitting: Internal Medicine

## 2023-06-01 ENCOUNTER — Other Ambulatory Visit: Payer: Self-pay | Admitting: Internal Medicine

## 2023-06-01 VITALS — BP 131/89 | HR 84 | Temp 98.2°F | Ht 74.0 in | Wt 350.7 lb

## 2023-06-01 DIAGNOSIS — I2510 Atherosclerotic heart disease of native coronary artery without angina pectoris: Secondary | ICD-10-CM | POA: Insufficient documentation

## 2023-06-01 DIAGNOSIS — N3941 Urge incontinence: Secondary | ICD-10-CM

## 2023-06-01 DIAGNOSIS — I7121 Aneurysm of the ascending aorta, without rupture: Secondary | ICD-10-CM | POA: Insufficient documentation

## 2023-06-01 DIAGNOSIS — Z122 Encounter for screening for malignant neoplasm of respiratory organs: Secondary | ICD-10-CM | POA: Insufficient documentation

## 2023-06-01 DIAGNOSIS — Z87891 Personal history of nicotine dependence: Secondary | ICD-10-CM | POA: Insufficient documentation

## 2023-06-01 DIAGNOSIS — I700 Atherosclerosis of aorta: Secondary | ICD-10-CM | POA: Diagnosis not present

## 2023-06-01 DIAGNOSIS — I872 Venous insufficiency (chronic) (peripheral): Secondary | ICD-10-CM | POA: Diagnosis not present

## 2023-06-01 DIAGNOSIS — J439 Emphysema, unspecified: Secondary | ICD-10-CM | POA: Insufficient documentation

## 2023-06-01 NOTE — Progress Notes (Signed)
 CC: venous insufficiency  HPI:  Sharon Vasquez is a 73 y.o. female living with a history stated below and presents today for a 1 week follow-up of her venous insufficiency. Please see problem based assessment and plan for additional details.  Past Medical History:  Diagnosis Date   A-fib (HCC)    Acute cystitis without hematuria 11/02/2022   Acute hypoxemic respiratory failure (HCC) 02/20/2022   Acute on chronic HFrEF (heart failure with reduced ejection fraction) (HCC) 10/28/2022   Acute on chronic hypoxic respiratory failure (HCC) 01/04/2022   Acute upper respiratory infection 01/08/2022   Anxiety    Arthritis    "qwhre; joints, back" (04/17/2017)   Atrial fibrillation (HCC)    Benign breast cyst in female, left 01/08/2017   Found by Screening mammogram, evaluated by U/S on 01/08/17 and determined to be a benign simple breast cyst.   Breast cancer (HCC)    Cellulitis of left lower leg 05/30/2017   CHF (congestive heart failure) (HCC)    Chronic low back pain 08/21/2016   Chronic lower back pain    Chronic venous insufficiency    /notes 05/30/2017   COPD (chronic obstructive pulmonary disease) (HCC)    Depression    Diabetes (HCC) 01/08/2022   Diabetes mellitus without complication (HCC)    DVT (deep venous thrombosis) (HCC) 11/16/2016   Dysrhythmia    Esophageal dysmotility 11/10/2019   Previous workup by ENT with fiberoptic leryngoscopy which was normal. Dx with laryngeal pharyngeal reflux.   Foul smelling urine 05/04/2022   GERD (gastroesophageal reflux disease)    Headache    "weekly for the last 3 months" (04/17/2017)   History of hypokalemia 11/17/2022   History of pulmonary embolism    Pulmonary embolism   Hyperlipidemia    Hypertension    Hypokalemia 12/01/2022   Laryngopharyngeal reflux 12/18/2015   Morbid obesity (HCC)    PE (pulmonary embolism)    Pulmonary embolism (HCC) 09/21/2014   Right foot pain 03/03/2021   Sleep apnea    Stroke (HCC) 12/02/2021    Vocal cord polyp 12/18/2015    Current Outpatient Medications on File Prior to Visit  Medication Sig Dispense Refill   albuterol (VENTOLIN HFA) 108 (90 Base) MCG/ACT inhaler Inhale 2 puffs into the lungs every 4 (four) hours as needed for shortness of breath. 18 g 1   alendronate (FOSAMAX) 70 MG tablet Take 70 mg by mouth once a week. Take with a full glass of water on an empty stomach, Patient hasn't started medication.     aspirin EC 81 MG tablet Take 81 mg by mouth daily.     atorvastatin (LIPITOR) 80 MG tablet Take 1 tablet (80 mg total) by mouth daily. 100 tablet 2   empagliflozin (JARDIANCE) 10 MG TABS tablet Take 1 tablet (10 mg total) by mouth daily before breakfast. 30 tablet 1   esomeprazole (NEXIUM) 40 MG capsule Take 1 capsule (40 mg total) by mouth daily as needed (for heartburn or indigestion). (Patient taking differently: Take 40 mg by mouth daily.) 90 capsule 1   Fluticasone-Umeclidin-Vilant (TRELEGY ELLIPTA) 100-62.5-25 MCG/ACT AEPB Inhale 1 puff into the lungs daily. 60 each 5   gabapentin (NEURONTIN) 400 MG capsule Take 1 capsule (400 mg total) by mouth 2 (two) times daily. 60 capsule 2   letrozole (FEMARA) 2.5 MG tablet Take 1 tablet (2.5 mg total) by mouth daily. TAKE 1 TABLET(2.5 MG) BY MOUTH DAILY 90 tablet 3   LORazepam (ATIVAN) 0.5 MG tablet Take 0.5 mg  by mouth daily as needed for anxiety.     losartan (COZAAR) 25 MG tablet Take 1 tablet (25 mg total) by mouth daily. 30 tablet 11   metoprolol succinate (TOPROL-XL) 25 MG 24 hr tablet Take 1 tablet (25 mg total) by mouth daily. 100 tablet 2   naloxone (NARCAN) nasal spray 4 mg/0.1 mL Place 1 spray into the nose once as needed (for overdose suspected). for opioid overdose     oxyCODONE (ROXICODONE) 15 MG immediate release tablet Take 15 mg by mouth See admin instructions. Take 15mg  (1 tablet) by mouth every morning and 15mg  (1 tablet) at bedtime in addition to twice during the day as needed.     OXYGEN Inhale 2-3 L into  the lungs daily.     potassium chloride SA (KLOR-CON M) 20 MEQ tablet Take 2 tablets (40 mEq total) by mouth 2 (two) times daily. 90 tablet 3   PRESCRIPTION MEDICATION Inhale into the lungs at bedtime. cpap     rivaroxaban (XARELTO) 20 MG TABS tablet Take 1 tablet (20 mg total) by mouth every morning. 90 tablet 3   [START ON 08/13/2023] Semaglutide-Weight Management 1.7 MG/0.75ML SOAJ Inject 1.7 mg into the skin once a week for 28 days. 3 mL 0   [START ON 09/11/2023] Semaglutide-Weight Management 2.4 MG/0.75ML SOAJ Inject 2.4 mg into the skin once a week for 28 days. 3 mL 1   spironolactone (ALDACTONE) 25 MG tablet Take 1 tablet (25 mg total) by mouth daily. 90 tablet 3   torsemide (DEMADEX) 20 MG tablet TAKE 3 TABLETS(60 MG) BY MOUTH DAILY 270 tablet 0   No current facility-administered medications on file prior to visit.    Family History  Problem Relation Age of Onset   Breast cancer Mother        before age 29   Hypertension Mother    Hyperlipidemia Mother    Hypertension Maternal Grandfather    Hyperlipidemia Maternal Grandfather     Social History   Socioeconomic History   Marital status: Divorced    Spouse name: Not on file   Number of children: Not on file   Years of education: Not on file   Highest education level: Not on file  Occupational History   Not on file  Tobacco Use   Smoking status: Former    Current packs/day: 0.00    Average packs/day: 2.0 packs/day for 60.4 years (120.8 ttl pk-yrs)    Types: Cigarettes    Start date: 08/16/1961    Quit date: 01/02/2022    Years since quitting: 1.4   Smokeless tobacco: Never   Tobacco comments:    Pt smokes about 4 ciggs daily. Stopped 01/02/2022   Vaping Use   Vaping status: Never Used  Substance and Sexual Activity   Alcohol use: No   Drug use: No   Sexual activity: Not Currently    Partners: Male  Other Topics Concern   Not on file  Social History Narrative   Lives in [facility] in [place].  Lives alone, but is dependent in ADLs/IADLs. Previously had HH PT and RN but dismissed them with plans to use the [employer]. Two daughters live nearby. Previously resided in [place].   Social Drivers of Health   Financial Resource Strain: Medium Risk (11/16/2022)   Overall Financial Resource Strain (CARDIA)    Difficulty of Paying Living Expenses: Somewhat hard  Food Insecurity: No Food Insecurity (12/01/2022)   Hunger Vital Sign    Worried About Running Out  of Food in the Last Year: Never true    Ran Out of Food in the Last Year: Never true  Transportation Needs: No Transportation Needs (12/01/2022)   PRAPARE - Administrator, Civil Service (Medical): No    Lack of Transportation (Non-Medical): No  Physical Activity: Inactive (11/16/2022)   Exercise Vital Sign    Days of Exercise per Week: 0 days    Minutes of Exercise per Session: 0 min  Stress: No Stress Concern Present (12/02/2021)   [employer] of Occupational Health - Occupational Stress Questionnaire    Feeling of Stress : Only a little  Social Connections: Moderately Integrated (03/15/2022)   Social Connection and Isolation Panel [NHANES]    Frequency of Communication with Friends and Family: More than three times a week    Frequency of Social Gatherings with Friends and Family: More than three times a week    Attends Religious Services: More than 4 times per year    Active Member of [employer] or Organizations: Yes    Attends Engineer, structural: More than 4 times per year    Marital Status: Divorced  Intimate Partner Violence: Not At Risk (12/01/2022)   Humiliation, Afraid, Rape, and Kick questionnaire    Fear of Current or Ex-Partner: No    Emotionally Abused: No    Physically Abused: No    Sexually Abused: No    Review of Systems: ROS negative except for what is noted on the assessment and plan.  Vitals:   06/01/23 0915  BP: 131/89  Pulse: 84  Temp: 98.2 F (36.8 C)  TempSrc: Oral  SpO2: 99%  Weight:  (!) 350 lb 11.2 oz (159.1 kg)  Height: 6\' 2"  (1.88 m)    Physical Exam: Constitutional: no acute distress, sitting in wheelchair comfortably  Cardiovascular: regular rate, irregular rhythm Pulmonary/Chest: normal work of breathing on room air MSK: trace bilateral lower extremity edema Neurological: alert & oriented x 4 Skin: skin in bilateral lower extremities is hyperpigmented, thickened, and hardened. No wounds/areas of open ulceration or weeping Psych: normal mood and behavior  Assessment & Plan:   Patient discussed with Dr. Lafonda Mosses  Chronic venous insufficiency The patient presents for 1 week follow-up of her chronic venous insufficiency/lower extremity edema after having Unna boots replaced 1 week ago.  She is a previous history of venoplasty and stenting of the left common iliac and external iliac veins.  On exam today, the patient's lower extremity edema is stable compared to last week, but improved compared to a few weeks ago.  She still has trace edema, and her skin remains hyperpigmented, thickened, and hardened in her lower extremities.  She has no wounds or areas of open ulceration/weeping.  Plan: - Unna boots were placed today - Follow-up in 1 week   Belia Febo, D.O. [facility], PGY-3 Phone: [phone] Date 06/01/2023 Time 3:10 PM

## 2023-06-01 NOTE — Assessment & Plan Note (Signed)
 The patient presents for 1 week follow-up of her chronic venous insufficiency/lower extremity edema after having Unna boots replaced 1 week ago.  She is a previous history of venoplasty and stenting of the left common iliac and external iliac veins.  On exam today, the patient's lower extremity edema is stable compared to last week, but improved compared to a few weeks ago.  She still has trace edema, and her skin remains hyperpigmented, thickened, and hardened in her lower extremities.  She has no wounds or areas of open ulceration/weeping.  Plan: - Unna boots were placed today - Follow-up in 1 week

## 2023-06-01 NOTE — Patient Instructions (Signed)
 Thank you, [name] for allowing [place] to provide your care today. Today we discussed:  Leg swelling We are going to do the unna boots one more week to see if we can get more swelling off Come back in 1 week again  I have ordered the following labs for you:  Lab Orders  No laboratory test(s) ordered today      Referrals ordered today:   Referral Orders  No referral(s) requested today     I have ordered the following medication/changed the following medications:   Stop the following medications: There are no discontinued medications.   Start the following medications: No orders of the defined types were placed in this encounter.    Follow up:  1 week     Remember: get your CT scan done today for lung cancer screening  Should you have any questions or concerns please call the internal medicine clinic at [phone].     Elza Rafter, D.O. [facility] \

## 2023-06-04 NOTE — Progress Notes (Signed)
 Internal Medicine Clinic Attending  Case discussed with the resident at the time of the visit.  We reviewed the resident's history and exam and pertinent patient test results.  I agree with the assessment, diagnosis, and plan of care documented in the resident's note.

## 2023-06-08 ENCOUNTER — Encounter: Payer: 59 | Admitting: Internal Medicine

## 2023-06-12 ENCOUNTER — Ambulatory Visit: Admitting: Student

## 2023-06-12 VITALS — BP 133/93 | HR 75 | Temp 97.9°F | Ht 74.0 in

## 2023-06-12 DIAGNOSIS — L602 Onychogryphosis: Secondary | ICD-10-CM

## 2023-06-12 DIAGNOSIS — I872 Venous insufficiency (chronic) (peripheral): Secondary | ICD-10-CM | POA: Diagnosis not present

## 2023-06-12 NOTE — Patient Instructions (Signed)
 Thank you, Ms.[name] for allowing [place] to provide your care today.   Lower extremity swelling I am glad to see that your legs look less swollen today.  Your skin does not have any openings so I do not want to continue the tight wraps for this week.  The best way to treat your chronic swelling in your legs is to elevate your legs is much as possible.  And to continue to take torsemide as prescribed.  Want to make sure that you do well without Unna boot so do come back next week and we will take another look at your legs.  I am referring you to the foot doctor, podiatry, to help with cutting her toenails.  Referrals ordered today:   Referral Orders         Ambulatory referral to Podiatry      I have ordered the following medication/changed the following medications:   Stop the following medications: Medications Discontinued During This Encounter  Medication Reason   Semaglutide-Weight Management 1.7 MG/0.75ML SOAJ      Start the following medications: No orders of the defined types were placed in this encounter.    Follow up:  1 week   We look forward to seeing you next time. Please call our clinic at [phone] if you have any questions or concerns. The best time to call is Monday-Friday from 9am-4pm, but there is someone available 24/7. If after hours or the weekend, call the main hospital number and ask for the Internal Medicine Resident On-Call. If you need medication refills, please notify your pharmacy one week in advance and they will send [place] a request.   Thank you for trusting me with your care. Wishing you the best!   Rudene Christians, DO [facility]

## 2023-06-12 NOTE — Progress Notes (Signed)
 Subjective:  CC: Chronic venous insufficiency  HPI:  Sharon Vasquez is a 73 y.o. female with a past medical history of heart failure recovered ejection fraction (EF 55-60% 12/24), Afib on xarelto, COPD, prediabetes, history of breast cancer in remission who presents today for follow-up on chronic venous insufficiency.   Was last seen in the office last week and unna boot was placed at that time.  Please see problem based assessment and plan for additional details.  Past Medical History:  Diagnosis Date   A-fib (HCC)    Acute cystitis without hematuria 11/02/2022   Acute hypoxemic respiratory failure (HCC) 02/20/2022   Acute on chronic HFrEF (heart failure with reduced ejection fraction) (HCC) 10/28/2022   Acute on chronic hypoxic respiratory failure (HCC) 01/04/2022   Acute upper respiratory infection 01/08/2022   Anxiety    Arthritis    "qwhre; joints, back" (04/17/2017)   Atrial fibrillation (HCC)    Benign breast cyst in female, left 01/08/2017   Found by Screening mammogram, evaluated by U/S on 01/08/17 and determined to be a benign simple breast cyst.   Breast cancer (HCC)    Cellulitis of left lower leg 05/30/2017   CHF (congestive heart failure) (HCC)    Chronic low back pain 08/21/2016   Chronic lower back pain    Chronic venous insufficiency    /notes 05/30/2017   COPD (chronic obstructive pulmonary disease) (HCC)    Depression    Diabetes (HCC) 01/08/2022   Diabetes mellitus without complication (HCC)    DVT (deep venous thrombosis) (HCC) 11/16/2016   Dysrhythmia    Esophageal dysmotility 11/10/2019   Previous workup by ENT with fiberoptic leryngoscopy which was normal. Dx with laryngeal pharyngeal reflux.   Foul smelling urine 05/04/2022   GERD (gastroesophageal reflux disease)    Headache    "weekly for the last 3 months" (04/17/2017)   History of hypokalemia 11/17/2022   History of pulmonary embolism    Pulmonary embolism   Hyperlipidemia     Hypertension    Hypokalemia 12/01/2022   Laryngopharyngeal reflux 12/18/2015   Morbid obesity (HCC)    PE (pulmonary embolism)    Pulmonary embolism (HCC) 09/21/2014   Right foot pain 03/03/2021   Sleep apnea    Stroke (HCC) 12/02/2021   Vocal cord polyp 12/18/2015    MEDICATIONS:  Torsemide 20 mg Trelegy Semaglutide 1.7 mg daily Empagliflozin 10 mg Letrozole 2.5 mg daily Xarelto 20 mg Sartain 25 mg daily Metoprolol succinate 25 mg daily Spironolactone 25 mg Potassium 40 mg equivalents twice daily Nexium 40 mg as needed Gabapentin 40 mg twice daily Oxybutynin 5 mg 3 times daily 7 mg weekly Atorvastatin milligrams daily Aspirin 81 mg   Family History  Problem Relation Age of Onset   Breast cancer Mother        before age 58   Hypertension Mother    Hyperlipidemia Mother    Hypertension Maternal Grandfather    Hyperlipidemia Maternal Grandfather     Past Surgical History:  Procedure Laterality Date   ABDOMINAL HYSTERECTOMY     APPENDECTOMY     BALLOON DILATION N/A 03/09/2020   Procedure: BALLOON DILATION;  Surgeon: Kathi Der, MD;  Location: [facility];  Service: Gastroenterology;  Laterality: N/A;   BIOPSY  03/09/2020   Procedure: BIOPSY;  Surgeon: Kathi Der, MD;  Location: [facility];  Service: Gastroenterology;;   BIOPSY  04/12/2021   Procedure: BIOPSY;  Surgeon: Kathi Der, MD;  Location: [facility];  Service: Gastroenterology;;  BREAST BIOPSY Right 03/11/2021   US biopsy/ coil clip/ path pending   BREAST BIOPSY Right 03/11/2021   US biopsy/ ribbone clip/ path pending   BREAST CYST EXCISION Left    "six o'clock"   BREAST LUMPECTOMY Left    BREAST LUMPECTOMY WITH RADIOACTIVE SEED LOCALIZATION Right 05/02/2021   Procedure: RIGHT BREAST LUMPECTOMY WITH RADIOACTIVE SEED LOCALIZATION;  Surgeon: Griselda Miner, MD;  Location: [facility];  Service: General;  Laterality: Right;   CHOLECYSTECTOMY     DILATION AND CURETTAGE OF UTERUS      ESOPHAGOGASTRODUODENOSCOPY (EGD) WITH PROPOFOL N/A 03/09/2020   Procedure: ESOPHAGOGASTRODUODENOSCOPY (EGD) WITH PROPOFOL;  Surgeon: Kathi Der, MD;  Location: [facility];  Service: Gastroenterology;  Laterality: N/A;   ESOPHAGOGASTRODUODENOSCOPY (EGD) WITH PROPOFOL N/A 04/12/2021   Procedure: ESOPHAGOGASTRODUODENOSCOPY (EGD) WITH PROPOFOL;  Surgeon: Kathi Der, MD;  Location: [facility];  Service: Gastroenterology;  Laterality: N/A;   IR ABLATE LIVER CRYOABLATION  07/23/2019   IR RADIOLOGIST EVAL & MGMT  07/18/2019   RIGHT/LEFT HEART CATH AND CORONARY ANGIOGRAPHY N/A 11/01/2022   Procedure: RIGHT/LEFT HEART CATH AND CORONARY ANGIOGRAPHY;  Surgeon: Swaziland, Peter M, MD;  Location: [facility];  Service: Cardiovascular;  Laterality: N/A;   SAVORY DILATION N/A 04/12/2021   Procedure: SAVORY DILATION;  Surgeon: Kathi Der, MD;  Location: [facility];  Service: Gastroenterology;  Laterality: N/A;   TONSILLECTOMY AND ADENOIDECTOMY     TUBAL LIGATION       Social History   Socioeconomic History   Marital status: Divorced    Spouse name: Not on file   Number of children: Not on file   Years of education: Not on file   Highest education level: Not on file  Occupational History   Not on file  Tobacco Use   Smoking status: Former    Current packs/day: 0.00    Average packs/day: 2.0 packs/day for 60.4 years (120.8 ttl pk-yrs)    Types: Cigarettes    Start date: 08/16/1961    Quit date: 01/02/2022    Years since quitting: 1.4   Smokeless tobacco: Never   Tobacco comments:    Pt smokes about 4 ciggs daily. Stopped 01/02/2022   Vaping Use   Vaping status: Never Used  Substance and Sexual Activity   Alcohol use: No   Drug use: No   Sexual activity: Not Currently    Partners: Male  Other Topics Concern   Not on file  Social History Narrative   Lives in [facility] in [place]. Lives alone, but is dependent in ADLs/IADLs. Previously had HH PT  and RN but dismissed them with plans to use the [employer]. Two daughters live nearby. Previously resided in [place].   Social Drivers of Health   Financial Resource Strain: Medium Risk (11/16/2022)   Overall Financial Resource Strain (CARDIA)    Difficulty of Paying Living Expenses: Somewhat hard  Food Insecurity: No Food Insecurity (12/01/2022)   Hunger Vital Sign    Worried About Running Out of Food in the Last Year: Never true    Ran Out of Food in the Last Year: Never true  Transportation Needs: No Transportation Needs (12/01/2022)   PRAPARE - Administrator, Civil Service (Medical): No    Lack of Transportation (Non-Medical): No  Physical Activity: Inactive (11/16/2022)   Exercise Vital Sign    Days of Exercise per Week: 0 days    Minutes of Exercise per Session: 0 min  Stress: No Stress Concern Present (12/02/2021)  [employer] of Occupational Health - Occupational Stress Questionnaire    Feeling of Stress : Only a little  Social Connections: Moderately Integrated (03/15/2022)   Social Connection and Isolation Panel [NHANES]    Frequency of Communication with Friends and Family: More than three times a week    Frequency of Social Gatherings with Friends and Family: More than three times a week    Attends Religious Services: More than 4 times per year    Active Member of [employer] or Organizations: Yes    Attends Engineer, structural: More than 4 times per year    Marital Status: Divorced  Intimate Partner Violence: Not At Risk (12/01/2022)   Humiliation, Afraid, Rape, and Kick questionnaire    Fear of Current or Ex-Partner: No    Emotionally Abused: No    Physically Abused: No    Sexually Abused: No    Review of Systems: ROS negative except for what is noted on the assessment and plan.  Objective:   Vitals:   06/12/23 1512  BP: (!) 133/93  Pulse: 75  Temp: 97.9 F (36.6 C)  TempSrc: Oral  SpO2: 98%  Height: 6\' 2"  (1.88 m)    Physical  Exam: Constitutional: well-appearing, in no acute distress HENT: normocephalic atraumatic, mucous membranes moist Eyes: conjunctiva non-erythematous Neck: supple Cardiovascular: regular rate and rhythm, no m/r/g Pulmonary/Chest: normal work of breathing on room air, lungs clear to auscultation bilaterally Abdominal: soft, non-tender, non-distended MSK: normal bulk and tone Neurological:uses power chair for mobility Skin:      Assessment & Plan:  Chronic venous insufficiency She has received 3 weeks of Unna boot treatment.  She has a longstanding history of chronic venous insufficiency dating back to 2018.  History of venoplasty and stenting of left common iliac and external iliac veins in [place].   Today her legs do not look edematous.  There are no signs of ulcers.  She is not able to reach her feet to help with care of her toenails.  She reports not being able to put her feet up at home in her current chair.  She request that we try to order her a powered recliner to help her elevate her feet.  Not able to wear compression stocking as she cannot get them on by herself. P: I do not think that Unna boots are needed this week but will have her follow-up next week to ensure that her legs are still looking okay. Referral placed to podiatry Will try to order DME with reclining powered chair, unsure if insurance will cover this. I reviewed with patient that main goals of therapy are to elevate her legs and compress them.   Patient discussed with Dr. Precious Bard Ahmon Tosi, D.O. [facility]  PGY-3 Pager: [phone]  Phone: [phone] Date 06/12/2023  Time 4:41 PM

## 2023-06-12 NOTE — Progress Notes (Deleted)
  Cardiology Office Note:  .   Date:  06/12/2023  ID:  MONTANA BRYNGELSON, DOB [birth year], [name] [id number] PCP: [name], DO  [facility] Providers Cardiologist:  Nanetta Batty, MD { Click to update primary MD,subspecialty MD or APP then REFRESH:1}   History of Present Illness: .   No chief complaint on file.   Sharon Vasquez is a 73 y.o. female with history of CHF, obesity, HLD, perm Afib, ILD who presents for follow-up.      Problem List Systolic HF/Takotsubo  -EF <20% 10/29/2022 -EF 55-60% 03/2023 2. Non-obstructive CAD -35% pLAD 3. HLD 4. Permanent Afib 5. HTN 6. OSA 7. Obesity 8. Tobacco abuse     ROS: All other ROS reviewed and negative. Pertinent positives noted in the HPI.     Studies Reviewed: [name]       TTE 03/07/2023  1. Left ventricular ejection fraction, by estimation, is 55 to 60%. The  left ventricle has normal function. The left ventricle has no regional  wall motion abnormalities. The left ventricular internal cavity size was  moderately dilated. There is mild  left ventricular hypertrophy. Left ventricular diastolic parameters are  indeterminate.   2. Right ventricular systolic function was not well visualized. The right  ventricular size is not well visualized. Tricuspid regurgitation signal is  inadequate for assessing PA pressure.   3. The mitral valve is normal in structure. No evidence of mitral valve  regurgitation.   4. The aortic valve is tricuspid. Aortic valve regurgitation is not  visualized. Aortic valve sclerosis is present, with no evidence of aortic  valve stenosis.   5. The inferior vena cava is normal in size with greater than 50%  respiratory variability, suggesting right atrial pressure of 3 mmHg.  Physical Exam:   VS:  There were no vitals taken for this visit.   Wt Readings from Last 3 Encounters:  06/01/23 (!) 350 lb 11.2 oz (159.1 kg)  05/14/23 (!) 348 lb 14.4 oz (158.3 kg)  05/01/23 (!) 352 lb (159.7 kg)    GEN:  Well nourished, well developed in no acute distress NECK: No JVD; No carotid bruits CARDIAC: ***RRR, no murmurs, rubs, gallops RESPIRATORY:  Clear to auscultation without rales, wheezing or rhonchi  ABDOMEN: Soft, non-tender, non-distended EXTREMITIES:  No edema; No deformity  ASSESSMENT AND PLAN: .   ***    {Are you ordering a CV Procedure (e.g. stress test, cath, DCCV, TEE, etc)?   Press F2        :[id number]}   Follow-up: No follow-ups on file.  Time Spent with Patient: I have spent a total of *** minutes caring for this patient today face to face, ordering and reviewing labs/tests, reviewing prior records/medical history, examining the patient, establishing an assessment and plan, communicating results/findings to the patient/family, and documenting in the medical record.   Signed, Lenna Gilford. Flora Lipps, MD, [facility]  [facility]  [address], [address] [place], [place] [address] [phone]  8:56 PM

## 2023-06-12 NOTE — Assessment & Plan Note (Addendum)
 She has received 3 weeks of Unna boot treatment.  She has a longstanding history of chronic venous insufficiency dating back to 2018.  History of venoplasty and stenting of left common iliac and external iliac veins in [place].   Today her legs do not look edematous.  There are no signs of ulcers.  She is not able to reach her feet to help with care of her toenails.  She reports not being able to put her feet up at home in her current chair.  She request that we try to order her a powered recliner to help her elevate her feet.  Not able to wear compression stocking as she cannot get them on by herself. P: I do not think that Unna boots are needed this week but will have her follow-up next week to ensure that her legs are still looking okay. Referral placed to podiatry Will try to order DME with reclining powered chair, unsure if insurance will cover this. I reviewed with patient that main goals of therapy are to elevate her legs and compress them.

## 2023-06-14 ENCOUNTER — Ambulatory Visit: Payer: 59 | Attending: Cardiovascular Disease | Admitting: Cardiovascular Disease

## 2023-06-14 DIAGNOSIS — E782 Mixed hyperlipidemia: Secondary | ICD-10-CM

## 2023-06-14 DIAGNOSIS — I4821 Permanent atrial fibrillation: Secondary | ICD-10-CM

## 2023-06-14 DIAGNOSIS — I10 Essential (primary) hypertension: Secondary | ICD-10-CM

## 2023-06-14 DIAGNOSIS — I2510 Atherosclerotic heart disease of native coronary artery without angina pectoris: Secondary | ICD-10-CM

## 2023-06-14 DIAGNOSIS — I5032 Chronic diastolic (congestive) heart failure: Secondary | ICD-10-CM

## 2023-06-19 ENCOUNTER — Ambulatory Visit: Admitting: Internal Medicine

## 2023-06-19 VITALS — BP 123/74 | HR 86 | Temp 97.6°F | Ht 74.0 in | Wt 343.5 lb

## 2023-06-19 DIAGNOSIS — I872 Venous insufficiency (chronic) (peripheral): Secondary | ICD-10-CM

## 2023-06-19 DIAGNOSIS — E782 Mixed hyperlipidemia: Secondary | ICD-10-CM

## 2023-06-19 DIAGNOSIS — I890 Lymphedema, not elsewhere classified: Secondary | ICD-10-CM

## 2023-06-19 DIAGNOSIS — E119 Type 2 diabetes mellitus without complications: Secondary | ICD-10-CM

## 2023-06-19 MED ORDER — GABAPENTIN 400 MG PO CAPS: 400.0000 mg | ORAL_CAPSULE | Freq: Two times a day (BID) | ORAL | 2 refills | Status: DC

## 2023-06-19 MED ORDER — ATORVASTATIN CALCIUM 80 MG PO TABS: 80.0000 mg | ORAL_TABLET | Freq: Every day | ORAL | 2 refills | Status: AC

## 2023-06-19 NOTE — Patient Instructions (Addendum)
 Thank you, [name] for allowing [place] to provide your care today. =  Leg swelling The best thing you can do for your legs is to get compression stockings on them and to elevate them as much as possible! I am not sure if insurance will cover the reclining chair but that has been ordered.  The foot doctor appointment is also ordered and you should hear from them soon.  I am referring you to physical therapy in [facility] for your lower leg swelling.  Follow-up in 2 months  Referrals ordered today:   Referral Orders         Ambulatory referral to Physical Therapy      I have ordered the following medication/changed the following medications:   Stop the following medications: Medications Discontinued During This Encounter  Medication Reason   atorvastatin (LIPITOR) 80 MG tablet Reorder   gabapentin (NEURONTIN) 400 MG capsule Reorder     Start the following medications: Meds ordered this encounter  Medications   atorvastatin (LIPITOR) 80 MG tablet    Sig: Take 1 tablet (80 mg total) by mouth daily.    Dispense:  100 tablet    Refill:  2    Please send a replace/new response with 100-Day Supply if appropriate to maximize member benefit. Requesting 1 year supply.   gabapentin (NEURONTIN) 400 MG capsule    Sig: Take 1 capsule (400 mg total) by mouth 2 (two) times daily.    Dispense:  60 capsule    Refill:  2     Follow up:  2 months    We look forward to seeing you next time. Please call our clinic at [phone] if you have any questions or concerns. The best time to call is Monday-Friday from 9am-4pm, but there is someone available 24/7. If after hours or the weekend, call the main hospital number and ask for the Internal Medicine Resident On-Call. If you need medication refills, please notify your pharmacy one week in advance and they will send [place] a request.   Thank you for trusting me with your care. Wishing you the best!   Rudene Christians, DO [facility]

## 2023-06-19 NOTE — Progress Notes (Signed)
 Subjective:  CC: lower leg swelling  HPI:  Sharon Vasquez is a 73 y.o. female with a past medical history of chronic venous insufficiency, HFrecEF, COPD, and venous insufficiency and diabetes who presents today for follow-up on lower extremity swelling.   Please see problem based assessment and plan for additional details.  Past Medical History:  Diagnosis Date   A-fib (HCC)    Acute cystitis without hematuria 11/02/2022   Acute hypoxemic respiratory failure (HCC) 02/20/2022   Acute on chronic HFrEF (heart failure with reduced ejection fraction) (HCC) 10/28/2022   Acute on chronic hypoxic respiratory failure (HCC) 01/04/2022   Acute upper respiratory infection 01/08/2022   Anxiety    Arthritis    "qwhre; joints, back" (04/17/2017)   Atrial fibrillation (HCC)    Benign breast cyst in female, left 01/08/2017   Found by Screening mammogram, evaluated by U/S on 01/08/17 and determined to be a benign simple breast cyst.   Breast cancer (HCC)    Cellulitis of left lower leg 05/30/2017   CHF (congestive heart failure) (HCC)    Chronic low back pain 08/21/2016   Chronic lower back pain    Chronic venous insufficiency    /notes 05/30/2017   COPD (chronic obstructive pulmonary disease) (HCC)    Depression    Diabetes (HCC) 01/08/2022   Diabetes mellitus without complication (HCC)    DVT (deep venous thrombosis) (HCC) 11/16/2016   Dysrhythmia    Esophageal dysmotility 11/10/2019   Previous workup by ENT with fiberoptic leryngoscopy which was normal. Dx with laryngeal pharyngeal reflux.   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/18/2015    MEDICATIONS:  Torsemide 20 mg Trelegy ellipta Jardiacne 10 mg Oxycodone 15 mg q6 hrs PRN Semaglutide 2.4 mg Letrozole 2.5 mg Xaetlo 20 mg Losartan 25 mg Metoprolol succinate 25 mg Spironolactone 25 mg Postassium 40 meq bid Nexium 40 mg Gabapentin 400 mg bid Oxybutnin 5 mg Alendronate 70 mg Asa 81 mg Atorvastatin 80 mg  Family History  Problem Relation Age of Onset   Breast cancer Mother        before age 3   Hypertension Mother    Hyperlipidemia Mother    Hypertension Maternal Grandfather    Hyperlipidemia Maternal Grandfather     Past Surgical History:  Procedure Laterality Date   ABDOMINAL HYSTERECTOMY     APPENDECTOMY     BALLOON DILATION N/A 03/09/2020   Procedure: BALLOON DILATION;  Surgeon: Kathi Der, MD;  Location: [facility];  Service: Gastroenterology;  Laterality: N/A;   BIOPSY  03/09/2020   Procedure: BIOPSY;  Surgeon: Kathi Der, MD;  Location: [facility];  Service: Gastroenterology;;   BIOPSY  04/12/2021   Procedure: BIOPSY;  Surgeon: Kathi Der, MD;  Location: [facility];  Service: Gastroenterology;;   BREAST BIOPSY Right 03/11/2021   US biopsy/ coil clip/ path pending   BREAST BIOPSY Right 03/11/2021   US biopsy/ ribbone clip/ path pending   BREAST CYST EXCISION Left    "  six o'clock"   BREAST LUMPECTOMY Left    BREAST LUMPECTOMY WITH RADIOACTIVE SEED LOCALIZATION Right 05/02/2021   Procedure: RIGHT BREAST LUMPECTOMY WITH RADIOACTIVE SEED LOCALIZATION;  Surgeon: Griselda Miner, MD;  Location: [facility];  Service: General;  Laterality: Right;   CHOLECYSTECTOMY     DILATION AND CURETTAGE OF UTERUS     ESOPHAGOGASTRODUODENOSCOPY (EGD) WITH PROPOFOL N/A 03/09/2020   Procedure: ESOPHAGOGASTRODUODENOSCOPY (EGD) WITH PROPOFOL;  Surgeon: Kathi Der, MD;  Location: [facility];  Service: Gastroenterology;   Laterality: N/A;   ESOPHAGOGASTRODUODENOSCOPY (EGD) WITH PROPOFOL N/A 04/12/2021   Procedure: ESOPHAGOGASTRODUODENOSCOPY (EGD) WITH PROPOFOL;  Surgeon: Kathi Der, MD;  Location: [facility];  Service: Gastroenterology;  Laterality: N/A;   IR ABLATE LIVER CRYOABLATION  07/23/2019   IR RADIOLOGIST EVAL & MGMT  07/18/2019   RIGHT/LEFT HEART CATH AND CORONARY ANGIOGRAPHY N/A 11/01/2022   Procedure: RIGHT/LEFT HEART CATH AND CORONARY ANGIOGRAPHY;  Surgeon: Swaziland, Peter M, MD;  Location: [facility] INVASIVE CV LAB;  Service: Cardiovascular;  Laterality: N/A;   SAVORY DILATION N/A 04/12/2021   Procedure: SAVORY DILATION;  Surgeon: Kathi Der, MD;  Location: [facility];  Service: Gastroenterology;  Laterality: N/A;   TONSILLECTOMY AND ADENOIDECTOMY     TUBAL LIGATION       Social History   Socioeconomic History   Marital status: Divorced    Spouse name: Not on file   Number of children: Not on file   Years of education: Not on file   Highest education level: Not on file  Occupational History   Not on file  Tobacco Use   Smoking status: Former    Current packs/day: 0.00    Average packs/day: 2.0 packs/day for 60.4 years (120.8 ttl pk-yrs)    Types: Cigarettes    Start date: 08/16/1961    Quit date: 01/02/2022    Years since quitting: 1.4   Smokeless tobacco: Never   Tobacco comments:    Pt smokes about 4 ciggs daily. Stopped 01/02/2022   Vaping Use   Vaping status: Never Used  Substance and Sexual Activity   Alcohol use: No   Drug use: No   Sexual activity: Not Currently    Partners: Male  Other Topics Concern   Not on file  Social History Narrative   Lives in [facility] in [place]. Lives alone, but is dependent in ADLs/IADLs. Previously had HH PT and RN but dismissed them with plans to use the [employer]. Two daughters live nearby. Previously resided in [place].   Social Drivers of Health   Financial Resource Strain: Medium Risk (11/16/2022)   Overall Financial  Resource Strain (CARDIA)    Difficulty of Paying Living Expenses: Somewhat hard  Food Insecurity: No Food Insecurity (12/01/2022)   Hunger Vital Sign    Worried About Running Out of Food in the Last Year: Never true    Ran Out of Food in the Last Year: Never true  Transportation Needs: No Transportation Needs (12/01/2022)   PRAPARE - Administrator, Civil Service (Medical): No    Lack of Transportation (Non-Medical): No  Physical Activity: Inactive (11/16/2022)   Exercise Vital Sign    Days of Exercise per Week: 0 days    Minutes of Exercise per Session: 0 min  Stress: No Stress Concern Present (12/02/2021)   [employer] of Occupational Health - Occupational Stress Questionnaire    Feeling of Stress : Only a little  Social Connections: Moderately Integrated (03/15/2022)   Social Connection and Isolation Panel [NHANES]  Frequency of Communication with Friends and Family: More than three times a week    Frequency of Social Gatherings with Friends and Family: More than three times a week    Attends Religious Services: More than 4 times per year    Active Member of [employer] or Organizations: Yes    Attends Engineer, structural: More than 4 times per year    Marital Status: Divorced  Intimate Partner Violence: Not At Risk (12/01/2022)   Humiliation, Afraid, Rape, and Kick questionnaire    Fear of Current or Ex-Partner: No    Emotionally Abused: No    Physically Abused: No    Sexually Abused: No    Review of Systems: ROS negative except for what is noted on the assessment and plan.  Objective:   Vitals:   06/19/23 0935  BP: 123/74  Pulse: 86  Temp: 97.6 F (36.4 C)  TempSrc: Oral  SpO2: 96%  Weight: (!) 343 lb 8 oz (155.8 kg)  Height: 6\' 2"  (1.88 m)    Physical Exam: Constitutional: well-appearing, in no acute distress Cardiovascular: regular rate and rhythm, no m/r/g Pulmonary/Chest: normal work of breathing on room air, lungs clear to auscultation  bilaterally Abdominal: soft, non-tender, non-distended MSK: hyperpigmented, with thickening of skin, no ulcerations, mild non pitting edema to bilateral lower extremities Skin: warm and dry       Assessment & Plan:  Chronic venous insufficiency Patient with long standing history of venous stasis insufficiency. She has chronic skin changes with skin thickening and hyperkeratosis. She received 3 weeks of Unna boots and went without them for last week. Her legs have non pitting edema and actually more wrinkling of skin is present this week. I do not think edema is related to hypervolemia as her weight is down and she has been adherent with torsemide. A: patient with long standing venous insufficiency. I do think exam is consistent with lymphedema P: Referral to PT for lymphedema placed   Patient discussed with Dr. Wille Celeste Sallye Lunz, D.O. [facility]  PGY-3 Pager: [phone]  Phone: [phone] Date 06/19/2023  Time 1:31 PM

## 2023-06-19 NOTE — Progress Notes (Unsigned)
 Cardiology Office Note:  .   Date:  06/20/2023  ID:  Sharon Vasquez, DOB March 24, 1951, [name] [id number] PCP: [name], DO  [facility] Providers Cardiologist:  None { History of Present Illness: .    Chief Complaint  Patient presents with   Follow-up         Sharon Vasquez is a 73 y.o. female with history of CHF, non-obstructive CAD, permanent afib, HTN, obesity who presents for follow-up.    History of Present Illness   Sharon Vasquez is a 73 year old female with systolic heart failure and atrial fibrillation who presents for follow-up.  She has a history of systolic heart failure with recovery of ejection fraction, nonobstructive coronary artery disease, hypertension, and hyperlipidemia. Her ejection fraction improved from 20% to 60% following treatment, and she has been participating in heart rehabilitation therapy. She has graduated from the heart failure clinic.  She has permanent atrial fibrillation and is on metoprolol and Xarelto for rate control and anticoagulation, respectively. No history of strokes.  She experiences fluid retention and swelling, which she attributes to high salt intake from restaurant meals. She is taking torsemide 60 mg daily to manage fluid retention and uses a NuStep machine for exercise. She has lost 40 pounds, now weighing 380 pounds, and is on Wegovy for weight management.  She experiences lymphedema and lower extremity edema, for which she wraps her legs and elevates them when possible.  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 She sleeps very little, often due to anxiety about appointments.  She quit smoking on January 02, 2022, and does not have a history of diabetes. She is concerned about dental issues, as no dentist will perform extractions due to her heart conditions and anticoagulation therapy.           Problem List Systolic HF/Takotsubo  -EF <20%  10/29/2022 -EF 55-60% 03/2023 2. Non-obstructive CAD -35% pLAD 3. HLD -T chol 91, HDL 26, LDL 46, TG 99 4. Permanent Afib 5. HTN 6. OSA 7. Obesity -BMI 44 8. Tobacco abuse  -quit 2023    ROS: All other ROS reviewed and negative. Pertinent positives noted in the HPI.     Studies Reviewed: [name]   EKG Interpretation Date/Time:  Wednesday June 20 2023 14:17:53 EDT Ventricular Rate:  71 PR Interval:    QRS Duration:  112 QT Interval:  414 QTC Calculation: 449 R Axis:   -41  Text Interpretation: Atrial fibrillation Left axis deviation Cannot rule out Anterior infarct (cited on or before 07-Feb-2023) Confirmed by [name] [phone]) on 06/20/2023 2:22:25 PM    LHC 11/01/2022   Prox LAD lesion is 35% stenosed.   LV end diastolic pressure is normal.   Hemodynamic findings consistent with mild pulmonary hypertension.   Mild nonobstructive CAD Normal LV filling pressures. PCWP 19/22 with mean 16 mm Hg. LVEDP 14 mm Hg Mild pulmonary HTN PAP 40/20 with mean 28 mm Hg Normal RA pressure 9 mm Hg Normal Cardiac output 8.72 L/min, index 3.21  TTE 03/07/2023  1. Left ventricular ejection fraction, by estimation, is 55 to 60%. The  left ventricle has normal function. The left ventricle has no regional  wall motion abnormalities. The left ventricular internal cavity size was  moderately dilated. There is mild  left ventricular hypertrophy. Left ventricular diastolic parameters are  indeterminate.   2. Right ventricular systolic function was not well visualized. The  right  ventricular size is not well visualized. Tricuspid regurgitation signal is  inadequate for assessing PA pressure.   3. The mitral valve is normal in structure. No evidence of mitral valve  regurgitation.   4. The aortic valve is tricuspid. Aortic valve regurgitation is not  visualized. Aortic valve sclerosis is present, with no evidence of aortic  valve stenosis.   5. The inferior vena cava is normal in size with  greater than 50%  respiratory variability, suggesting right atrial pressure of 3 mmHg.  Physical Exam:   VS:  BP 98/70   Pulse 71   Ht 6\' 2"  (1.88 m)   Wt (!) 342 lb (155.1 kg)   SpO2 96%   BMI 43.91 kg/m    Wt Readings from Last 3 Encounters:  06/20/23 (!) 342 lb (155.1 kg)  06/19/23 (!) 343 lb 8 oz (155.8 kg)  06/01/23 (!) 350 lb 11.2 oz (159.1 kg)    GEN: Well nourished, well developed in no acute distress NECK: No JVD; No carotid bruits CARDIAC: irregular rhythm, no murmurs, rubs, gallops RESPIRATORY:  Clear to auscultation without rales, wheezing or rhonchi  ABDOMEN: Soft, non-tender, non-distended EXTREMITIES:  2+ pitting edema, lymphedema noted  ASSESSMENT AND PLAN: .   Assessment and Plan    Systolic heart failure with recovered ejection fraction Heart function normalized after takotsubo cardiomyopathy. - Continue metoprolol succinate 25 mg daily. - Continue losartan 25 mg daily. - Continue spironolactone 25 mg daily. - Continue Jardiance 10 mg daily. - Continue torsemide 60 mg daily.  - We discussed salt reduction   Permanent atrial fibrillation Rate controlled with heart rate in the 70s. On Xarelto for anticoagulation. - Continue Xarelto 20 mg daily. - Discontinue aspirin. - Continue metoprolol succinate 25 mg daily.  Nonobstructive coronary artery disease (CAD) No significant blockages. Aspirin not needed due to Xarelto. - Discontinue aspirin.  Hypertension Well-managed with current medications.  Hyperlipidemia LDL at 46, managed with Crestor. - Continue Crestor 20 mg daily.  Lymphedema Lower extremity edema with fluid retention. Salt intake contributes. Discussed salt restriction and leg elevation. Referral to lymphedema clinic planned. - Refer to lymphedema clinic in [place]. - Continue torsemide 60 mg daily. - Encourage leg elevation and wrapping. - Advise on salt restriction.  Obesity Morbid obesity with BMI of 43. On Wegovy with 40-pound  weight loss. Emphasized weight loss and dietary modifications. - Continue MJ.Hock. - Encourage weight loss efforts.  Follow-up Plan to monitor condition and medication regimen. - Schedule follow-up with PA in 3 months. - Schedule annual follow-up with cardiologist in 1 year.              Follow-up: Return in about 3 months (around 09/20/2023).   Signed, Lenna Gilford. Flora Lipps, MD, [facility]  [facility]  [address] [place], [place] [address] [phone]  3:04 PM

## 2023-06-19 NOTE — Assessment & Plan Note (Signed)
 Patient with long standing history of venous stasis insufficiency. She has chronic skin changes with skin thickening and hyperkeratosis. She received 3 weeks of Unna boots and went without them for last week. Her legs have non pitting edema and actually more wrinkling of skin is present this week. I do not think edema is related to hypervolemia as her weight is down and she has been adherent with torsemide. A: patient with long standing venous insufficiency. I do think exam is consistent with lymphedema P: Referral to PT for lymphedema placed

## 2023-06-20 ENCOUNTER — Ambulatory Visit: Attending: Cardiovascular Disease | Admitting: Cardiovascular Disease

## 2023-06-20 ENCOUNTER — Encounter: Payer: Self-pay | Admitting: Cardiovascular Disease

## 2023-06-20 VITALS — BP 98/70 | HR 71 | Ht 74.0 in | Wt 342.0 lb

## 2023-06-20 DIAGNOSIS — R2681 Unsteadiness on feet: Secondary | ICD-10-CM | POA: Diagnosis not present

## 2023-06-20 DIAGNOSIS — J9601 Acute respiratory failure with hypoxia: Secondary | ICD-10-CM | POA: Diagnosis not present

## 2023-06-20 DIAGNOSIS — G8929 Other chronic pain: Secondary | ICD-10-CM | POA: Diagnosis not present

## 2023-06-20 DIAGNOSIS — I4821 Permanent atrial fibrillation: Secondary | ICD-10-CM

## 2023-06-20 DIAGNOSIS — I2510 Atherosclerotic heart disease of native coronary artery without angina pectoris: Secondary | ICD-10-CM

## 2023-06-20 DIAGNOSIS — E782 Mixed hyperlipidemia: Secondary | ICD-10-CM | POA: Diagnosis not present

## 2023-06-20 DIAGNOSIS — Z79899 Other long term (current) drug therapy: Secondary | ICD-10-CM | POA: Diagnosis not present

## 2023-06-20 DIAGNOSIS — G4733 Obstructive sleep apnea (adult) (pediatric): Secondary | ICD-10-CM | POA: Diagnosis not present

## 2023-06-20 DIAGNOSIS — I890 Lymphedema, not elsewhere classified: Secondary | ICD-10-CM | POA: Diagnosis not present

## 2023-06-20 DIAGNOSIS — I5020 Unspecified systolic (congestive) heart failure: Secondary | ICD-10-CM

## 2023-06-20 DIAGNOSIS — M25561 Pain in right knee: Secondary | ICD-10-CM | POA: Diagnosis not present

## 2023-06-20 DIAGNOSIS — J9611 Chronic respiratory failure with hypoxia: Secondary | ICD-10-CM | POA: Diagnosis not present

## 2023-06-20 DIAGNOSIS — I10 Essential (primary) hypertension: Secondary | ICD-10-CM | POA: Diagnosis not present

## 2023-06-20 DIAGNOSIS — M25562 Pain in left knee: Secondary | ICD-10-CM | POA: Diagnosis not present

## 2023-06-20 DIAGNOSIS — M170 Bilateral primary osteoarthritis of knee: Secondary | ICD-10-CM | POA: Diagnosis not present

## 2023-06-20 MED ORDER — TORSEMIDE 60 MG PO TABS: 60.0000 mg | ORAL_TABLET | Freq: Every day | ORAL | 3 refills | Status: AC

## 2023-06-20 NOTE — Progress Notes (Signed)
 Internal Medicine Clinic Attending  Case discussed with the resident at the time of the visit.  We reviewed the resident's history and exam and pertinent patient test results.  I agree with the assessment, diagnosis, and plan of care documented in the resident's note.

## 2023-06-20 NOTE — Patient Instructions (Signed)
 Medication Instructions:  - STOP ASPIRIN  - Start TORSEMIDE 60 MG DAILY    *If you need a refill on your cardiac medications before your next appointment, please call your pharmacy*   Lab Work: NONE    If you have labs (blood work) drawn today and your tests are completely normal, you will receive your results only by: MyChart Message (if you have MyChart) OR A paper copy in the mail If you have any lab test that is abnormal or we need to change your treatment, we will call you to review the results.   Testing/Procedures: NONE    Follow-Up: At [facility] you and your health needs are our priority.  As part of our continuing mission to provide you with exceptional heart care, we have created designated Provider Care Teams.  These Care Teams include your primary Cardiologist (physician) and Advanced Practice Providers (APPs -  Physician Assistants and Nurse Practitioners) who all work together to provide you with the care you need, when you need it.  We recommend signing up for the patient portal called "MyChart".  Sign up information is provided on this After Visit Summary.  MyChart is used to connect with patients for Virtual Visits (Telemedicine).  Patients are able to view lab/test results, encounter notes, upcoming appointments, etc.  Non-urgent messages can be sent to your provider as well.   To learn more about what you can do with MyChart, go to [email or web address].    Your next appointment:   3 month(s)  The format for your next appointment:   In Person  Provider:   Edd Fabian, FNP, Micah Flesher, PA-C, Marjie Skiff, PA-C, Robet Leu, PA-C, Juanda Crumble, PA-C, Joni Reining, DNP, ANP, Azalee Course, PA-C, Bernadene Person, NP, or Reather Littler, NP    Then, [name], MD   will plan to see you again in 1 year(s).   Other Instructions

## 2023-06-25 DIAGNOSIS — Z79899 Other long term (current) drug therapy: Secondary | ICD-10-CM | POA: Diagnosis not present

## 2023-06-25 NOTE — Progress Notes (Signed)
 Internal Medicine Clinic Attending  Case discussed with the resident at the time of the visit.  We reviewed the resident's history and exam and pertinent patient test results.  I agree with the assessment, diagnosis, and plan of care documented in the resident's note.

## 2023-06-26 ENCOUNTER — Ambulatory Visit: Payer: Self-pay

## 2023-06-26 NOTE — Patient Instructions (Signed)
 Visit Information  Thank you for taking time to visit with me today. Please don't hesitate to contact me if I can be of assistance to you.   Following are the goals we discussed today:   Goals Addressed             This Visit's Progress    I want to monitor and control my conditions       Patient Goals/Self Care Activities: -Patient/Caregiver will take medications as prescribed   -Patient/Caregiver will attend all scheduled provider appointments -Patient/Caregiver will call pharmacy for medication refills 3-7 days in advance of running out of medications -Patient/Caregiver will call provider office for new concerns or questions  -Patient/Caregiver will focus on medication adherence by taking medications as prescribed  -Weigh daily and record (notify MD with 3 lb weight gain over night or 5 lb in a week) -Follow CHF Action Plan -Adhere to low sodium diet  -elevate legs -go to the lymphedema clinic appointments Wt Readings from Last 3 Encounters:  06/20/23 (!) 342 lb (155.1 kg)  06/19/23 (!) 343 lb 8 oz (155.8 kg)  06/01/23 (!) 350 lb 11.2 oz (159.1 kg)   -Keep your airway clear from mucus build up  -Use a humidifier, if needed -Take your fluid pill as prescribed and monitor your diet -Self assess COPD action plan zone and make appointment with provider if you have been in the yellow zone for 48 hours without improvement.  -go to the balance clinic -coninute with the use of your leg wraps         Our next appointment is by telephone on 08/15/23 at 930 am  Please call the care guide team at [phone] if you need to cancel or reschedule your appointment.   If you are experiencing a Mental Health or Behavioral Health Crisis or need someone to talk to, please call 1-800-273-TALK (toll free, 24 hour hotline)  Patient verbalizes understanding of instructions and care plan provided today and agrees to view in MyChart. Active MyChart status and patient understanding of how to  access instructions and care plan via MyChart confirmed with patient.     Juanell Fairly RN, BSN, [facility]  [facility], [facility]  Care Coordinator Phone: [phone]

## 2023-06-26 NOTE — Patient Outreach (Signed)
 Care Coordination   Follow Up Visit Note   06/26/2023 Name: Sharon Vasquez [name]: [id number] DOB: 04/18/1950  Sharon Vasquez is a 73 y.o. year old female who sees Atway, Rayann N, DO for primary care. I spoke with  Sharon Vasquez by phone today.  What matters to the patients health and wellness today?  I had a discussion with Sharon Vasquez today. She attended an appointment with her primary care physician on March 18, during which her blood pressure was recorded at 123/74 and her weight was 343 pounds. Notably, her A1C level is 6.1, indicating stable glucose control.  On March 19, she visited her cardiologist, where her blood pressure measured 98/70, and her weight was noted as 342 pounds. While there has been some improvement, she continues to experience swelling in her lower extremities, prompting a referral to the lymphedema clinic. Additionally, her ejection fraction has improved to 60 percent.  The cardiology team has recommended discontinuing aspirin and continuing with a daily dosage of 60 milligrams of Torsemide. Furthermore, they have advised her to avoid salt, implement dietary modifications, and elevate her legs. She is scheduled for a follow-up appointment with the physician assistant in three months.    Goals Addressed             This Visit's Progress    I want to monitor and control my conditions       Patient Goals/Self Care Activities: -Patient/Caregiver will take medications as prescribed   -Patient/Caregiver will attend all scheduled provider appointments -Patient/Caregiver will call pharmacy for medication refills 3-7 days in advance of running out of medications -Patient/Caregiver will call provider office for new concerns or questions  -Patient/Caregiver will focus on medication adherence by taking medications as prescribed  -Weigh daily and record (notify MD with 3 lb weight gain over night or 5 lb in a week) -Follow CHF Action Plan -Adhere to low sodium diet   -elevate legs -go to the lymphedema clinic appointments Wt Readings from Last 3 Encounters:  06/20/23 (!) 342 lb (155.1 kg)  06/19/23 (!) 343 lb 8 oz (155.8 kg)  06/01/23 (!) 350 lb 11.2 oz (159.1 kg)   -Keep your airway clear from mucus build up  -Use a humidifier, if needed -Take your fluid pill as prescribed and monitor your diet -Self assess COPD action plan zone and make appointment with provider if you have been in the yellow zone for 48 hours without improvement.  -go to the balance clinic -coninute with the use of your leg wraps         SDOH assessments and interventions completed:  No     Care Coordination Interventions:  Yes, provided   Interventions Today    Flowsheet Row Most Recent Value  General Interventions   General Interventions Discussed/Reviewed General Interventions Discussed, General Interventions Reviewed, Doctor Visits  Doctor Visits Discussed/Reviewed Doctor Visits Discussed, Specialist  PCP/Specialist Visits Compliance with follow-up visit  Education Interventions   Education Provided Provided Education  Provided Verbal Education On Nutrition  Nutrition Interventions   Nutrition Discussed/Reviewed Nutrition Discussed, Decreasing salt  Pharmacy Interventions   Pharmacy Dicussed/Reviewed Pharmacy Topics Discussed, Pharmacy Topics Reviewed  Safety Interventions   Safety Discussed/Reviewed Safety Discussed        Follow up plan: Follow up call scheduled for 08/15/23  930 am    Encounter Outcome:  Patient Visit Completed   Juanell Fairly RN, BSN, [facility]  [facility], [facility]  Care Coordinator Phone: [phone]

## 2023-06-28 ENCOUNTER — Encounter (HOSPITAL_BASED_OUTPATIENT_CLINIC_OR_DEPARTMENT_OTHER): Payer: Self-pay | Admitting: Adult Health

## 2023-06-28 ENCOUNTER — Ambulatory Visit (INDEPENDENT_AMBULATORY_CARE_PROVIDER_SITE_OTHER): Admitting: Adult Health

## 2023-06-28 VITALS — BP 118/72 | HR 80 | Ht 74.0 in | Wt 342.0 lb

## 2023-06-28 DIAGNOSIS — K219 Gastro-esophageal reflux disease without esophagitis: Secondary | ICD-10-CM | POA: Diagnosis not present

## 2023-06-28 DIAGNOSIS — G4733 Obstructive sleep apnea (adult) (pediatric): Secondary | ICD-10-CM | POA: Diagnosis not present

## 2023-06-28 MED ORDER — ESOMEPRAZOLE MAGNESIUM 40 MG PO CPDR: 40.0000 mg | DELAYED_RELEASE_CAPSULE | Freq: Every day | ORAL | 1 refills | Status: DC | PRN

## 2023-06-28 NOTE — Progress Notes (Signed)
 @Patient  ID: Sharon Vasquez, female    DOB: Sep 29, 1950, 73 y.o.   [name]: [id number]  Chief Complaint  Patient presents with   Follow-up   Discussed the use of AI scribe software for clinical note transcription with the patient, who gave verbal consent to proceed.   Referring provider: [name], DO  HPI: 73 yo female former smoker followed for COPD and RB-ILD , Chronic respiratory Failure on O2, OSA Medical history significant for A-fib, combined systolic/diastolic heart failure, chronic pain syndrome, Hx of PE   TEST/EVENTS :  Serology 06/2021 ANA, CCP neg, ACE 30, HSP neg   HRCT 05/2021  " alternative " mild progression compared to the prior study and extensive air trapping,  favor chronic hypersensitivity pneumonitis   HRCT 05/2020 >> probable RB ILD, no worsening, enlarged pulmonary artery 4.4 cm, 4.4 cm ascending aortic aneurysm   HRCT 10/2019 >> Mild patchy ground-glass opacity and minimal subpleural reticulation without bronchiectasis or honeycombing. Extensive air trapping >> RBILD, enlarged PA   HRCT 12/2017 >> Mild interlobular septal thickening and mild patchy peribronchovascular ground-glass opacity in both lungs. Minimal patchy subpleural reticulation in the upper lobes ? Pul edema vs RB-ILD   PFTs 03/2021 moderate restriction, ratio 89, FEV1 74%, FVC 65%, TLC 54%, DLCO 15.3/62%   PFTs 12/2017 - No obstruction, mild-mod restriction, decreased DLCO  FVC 2.16 (66%), FEV1 1.93 (75%), ratio 89, DLCOcor 16.45 (48%)   FENO 12/2017 5   Echo 10/2020 moderate LVH, RVSP 37  ECHO 07/2016 - EF 60-65%, PA peak pressure 29mm Hg   HST 12/2017 >> AHI 20/h   Esophagram 01/2020 >> Smooth strictured narrowing involving the distal esophagus near the GE junction  06/28/2023 Follow up : COPD, RB-ILD, O2 RF Patient presents for 19-month follow-up.  Patient has COPD and respiratory bronchiolitis associated ILD changes on CT imaging.  Patient has a history of heavy smoking.  Quit smoking  in 2023.  She remains on Trelegy inhaler.  Overall says her breathing is doing okay.  She is short of breath with minimal activities.  She is very sedentary and uses a Art gallery manager due to leg and knee issues.  Says that she can stand and pivot but cannot walk any amount of distance.  Does have a albuterol inhaler which she does not use on a regular basis. She presents for the lung cancer CT screening program.  Recent CT chest results are pending.  Denies any hemoptysis or unintentional weight loss.  She has obstructive sleep apnea and experiences difficulty using her CPAP machine, which works intermittently and often cuts off during sleep. She has not used it consistently in the past two to three months due to discomfort and malfunction. A recent CPAP titration study 04/2023 showed optimal control at CPAP 16 cm H2O.     She does have oxygen at home currently using 2 L at bedtime.  Does have a to use with activity but says that her O2 saturations typically are above 90% and does not require She is followed by cardiology for congestive heart failure.  Previous echo showed EF of less than 20%.  Follow-up echo December 2024 showed improvement with EF at 55 to 60%.  She resides in a senior complex and uses [employer] for transportation. She has lost approximately 40 pounds since stopping smoking and starting semaglutide and Jardiance.     She is working currently to try to find a dentist for teeth extractions and dentures.       No  Known Allergies  Immunization History  Administered Date(s) Administered   Moderna Sars-Covid-2 Vaccination 08/20/2019, 09/11/2019   PFIZER(Purple Top)SARS-COV-2 Vaccination 03/17/2020   Pneumococcal Conjugate-13 02/01/2017   Pneumococcal Polysaccharide-23 02/12/2020   Tdap 06/05/2019    Past Medical History:  Diagnosis Date   A-fib [facility])    Acute cystitis without hematuria 11/02/2022   Acute hypoxemic respiratory failure (HCC) 02/20/2022   Acute on chronic HFrEF  (heart failure with reduced ejection fraction) (HCC) 10/28/2022   Acute on chronic hypoxic respiratory failure (HCC) 01/04/2022   Acute upper respiratory infection 01/08/2022   Anxiety    Arthritis    "qwhre; joints, back" (04/17/2017)   Atrial fibrillation (HCC)    Benign breast cyst in female, left 01/08/2017   Found by Screening mammogram, evaluated by U/S on 01/08/17 and determined to be a benign simple breast cyst.   Breast cancer (HCC)    Cellulitis of left lower leg 05/30/2017   CHF (congestive heart failure) (HCC)    Chronic low back pain 08/21/2016   Chronic lower back pain    Chronic venous insufficiency    /notes 05/30/2017   COPD (chronic obstructive pulmonary disease) (HCC)    Depression    Diabetes (HCC) 01/08/2022   Diabetes mellitus without complication (HCC)    DVT (deep venous thrombosis) (HCC) 11/16/2016   Dysrhythmia    Esophageal dysmotility 11/10/2019   Previous workup by ENT with fiberoptic leryngoscopy which was normal. Dx with laryngeal pharyngeal reflux.   Foul smelling urine 05/04/2022   GERD (gastroesophageal reflux disease)    Headache    "weekly for the last 3 months" (04/17/2017)   History of hypokalemia 11/17/2022   History of pulmonary embolism    Pulmonary embolism   Hyperlipidemia    Hypertension    Hypokalemia 12/01/2022   Laryngopharyngeal reflux 12/18/2015   Morbid obesity (HCC)    PE (pulmonary embolism)    Pulmonary embolism (HCC) 09/21/2014   Right foot pain 03/03/2021   Sleep apnea    Stroke (HCC) 12/02/2021   Vocal cord polyp 12/18/2015    Tobacco History: Social History   Tobacco Use  Smoking Status Former   Current packs/day: 0.00   Average packs/day: 2.0 packs/day for 60.4 years (120.8 ttl pk-yrs)   Types: Cigarettes   Start date: 08/16/1961   Quit date: 01/02/2022   Years since quitting: 1.4  Smokeless Tobacco Never  Tobacco Comments   Pt smokes about 4 ciggs daily. Stopped 01/02/2022    Counseling given: Not  Answered Tobacco comments: Pt smokes about 4 ciggs daily. Stopped 01/02/2022    Outpatient Medications Prior to Visit  Medication Sig Dispense Refill   albuterol (VENTOLIN HFA) 108 (90 Base) MCG/ACT inhaler Inhale 2 puffs into the lungs every 4 (four) hours as needed for shortness of breath. 18 g 1   alendronate (FOSAMAX) 70 MG tablet Take 70 mg by mouth once a week. Take with a full glass of water on an empty stomach, Patient hasn't started medication.     atorvastatin (LIPITOR) 80 MG tablet Take 1 tablet (80 mg total) by mouth daily. 100 tablet 2   empagliflozin (JARDIANCE) 10 MG TABS tablet Take 1 tablet (10 mg total) by mouth daily before breakfast. 30 tablet 1   esomeprazole (NEXIUM) 40 MG capsule Take 1 capsule (40 mg total) by mouth daily as needed (for heartburn or indigestion). (Patient taking differently: Take 40 mg by mouth daily.) 90 capsule 1   Fluticasone-Umeclidin-Vilant (TRELEGY ELLIPTA) 100-62.5-25 MCG/ACT AEPB Inhale 1 puff into  the lungs daily. 60 each 5   gabapentin (NEURONTIN) 400 MG capsule Take 1 capsule (400 mg total) by mouth 2 (two) times daily. 60 capsule 2   letrozole (FEMARA) 2.5 MG tablet Take 1 tablet (2.5 mg total) by mouth daily. TAKE 1 TABLET(2.5 MG) BY MOUTH DAILY 90 tablet 3   LORazepam (ATIVAN) 0.5 MG tablet Take 0.5 mg by mouth daily as needed for anxiety.     losartan (COZAAR) 25 MG tablet Take 1 tablet (25 mg total) by mouth daily. 30 tablet 11   metoprolol succinate (TOPROL-XL) 25 MG 24 hr tablet Take 1 tablet (25 mg total) by mouth daily. 100 tablet 2   naloxone (NARCAN) nasal spray 4 mg/0.1 mL Place 1 spray into the nose once as needed (for overdose suspected). for opioid overdose     oxybutynin (DITROPAN) 5 MG tablet TAKE 1 TABLET BY MOUTH THREE TIMES DAILY 90 tablet 3   oxyCODONE (ROXICODONE) 15 MG immediate release tablet Take 15 mg by mouth See admin instructions. Take 15mg  (1 tablet) by mouth every morning and 15mg  (1 tablet) at bedtime in addition to  twice during the day as needed.     OXYGEN Inhale 2-3 L into the lungs daily.     potassium chloride SA (KLOR-CON M) 20 MEQ tablet Take 2 tablets (40 mEq total) by mouth 2 (two) times daily. 90 tablet 3   PRESCRIPTION MEDICATION Inhale into the lungs at bedtime. cpap     rivaroxaban (XARELTO) 20 MG TABS tablet Take 1 tablet (20 mg total) by mouth every morning. 90 tablet 3   [START ON 09/11/2023] Semaglutide-Weight Management 2.4 MG/0.75ML SOAJ Inject 2.4 mg into the skin once a week for 28 days. 3 mL 1   spironolactone (ALDACTONE) 25 MG tablet Take 1 tablet (25 mg total) by mouth daily. 90 tablet 3   Torsemide 60 MG TABS Take 60 mg by mouth daily. 90 tablet 3   No facility-administered medications prior to visit.     Review of Systems:   Constitutional:   No  weight loss, night sweats,  Fevers, chills, +fatigue, or  lassitude.  HEENT:   No headaches,  Difficulty swallowing,  Tooth/dental problems, or  Sore throat,                No sneezing, itching, ear ache, nasal congestion, post nasal drip,   CV:  No chest pain,  Orthopnea, PND, swelling in lower extremities, anasarca, dizziness, palpitations, syncope.   GI  No heartburn, indigestion, abdominal pain, nausea, vomiting, diarrhea, change in bowel habits, loss of appetite, bloody stools.   Resp: No excess mucus, no productive cough,  No non-productive cough,  No coughing up of blood.  No change in color of mucus.  No wheezing.  No chest wall deformity  Skin: no rash or lesions.  GU: no dysuria, change in color of urine, no urgency or frequency.  No flank pain, no hematuria   MS: Chronic arthritis in knees   Physical Exam  BP 118/72   Pulse 80   Ht 6\' 2"  (1.88 m)   Wt (!) 342 lb (155.1 kg)   SpO2 93%   BMI 43.91 kg/m   GEN: A/Ox3; pleasant , NAD, well nourished , motorized scooter, BMI 43   HEENT:  [place]/AT,   NOSE-clear, THROAT-clear, no lesions, no postnasal drip or exudate noted. Poor dentition  NECK:  Supple w/ fair  ROM; no JVD; normal carotid impulses w/o bruits; no thyromegaly or nodules palpated; no lymphadenopathy.  RESP  Clear  P & A; w/o, wheezes/ rales/ or rhonchi. no accessory muscle use, no dullness to percussion  CARD:  RRR, no m/r/g, 3+ peripheral edema, pulses intact, no cyanosis or clubbing.  GI:   Soft & nt; nml bowel sounds; no organomegaly or masses detected.   Musco: Warm bil, no deformities or joint swelling noted.   Neuro: alert, no focal deficits noted.    Skin: Warm, no lesions or rashes    Lab Results:  CBC    Component Value Date/Time   WBC 6.1 02/07/2023 2054   RBC 4.81 02/07/2023 2054   HGB 13.8 02/07/2023 2054   HGB 11.3 (L) 03/23/2021 0727   HGB 12.4 04/29/2018 1235   HCT 45.8 02/07/2023 2054   HCT 37.2 04/29/2018 1235   PLT 429 (H) 02/07/2023 2054   PLT 341 03/23/2021 0727   PLT 389 04/29/2018 1235   MCV 95.2 02/07/2023 2054   MCV 86 04/29/2018 1235   MCH 28.7 02/07/2023 2054   MCHC 30.1 02/07/2023 2054   RDW 13.9 02/07/2023 2054   RDW 13.4 04/29/2018 1235   LYMPHSABS 2.6 12/26/2022 1638   MONOABS 0.6 12/26/2022 1638   EOSABS 0.1 12/26/2022 1638   BASOSABS 0.0 12/26/2022 1638    BMET    Component Value Date/Time   NA 140 05/18/2023 1136   K 4.3 05/18/2023 1136   CL 99 05/18/2023 1136   CO2 22 05/18/2023 1136   GLUCOSE 89 05/18/2023 1136   GLUCOSE 89 03/07/2023 1038   BUN 17 05/18/2023 1136   CREATININE 0.94 05/18/2023 1136   CREATININE 0.92 03/23/2021 0727   CREATININE 1.21 (H) 07/26/2016 1528   CALCIUM 9.7 05/18/2023 1136   GFRNONAA 57 (L) 03/07/2023 1038   GFRNONAA >60 03/23/2021 0727   GFRAA 79 10/23/2019 0924    BNP    Component Value Date/Time   BNP 65.2 02/01/2023 1135    ProBNP No results found for: "PROBNP"  Imaging: No results found.  Administration History     None          Latest Ref Rng & Units 03/30/2021   12:12 PM 12/19/2017    9:29 AM  PFT Results  FVC-Pre L 2.08  2.23  P  FVC-Predicted Pre % 65  68   P  FVC-Post L 2.11  2.16  P  FVC-Predicted Post % 66  66  P  Pre FEV1/FVC % % 89  87  P  Post FEV1/FCV % % 92  89  P  FEV1-Pre L 1.84  1.95  P  FEV1-Predicted Pre % 74  76  P  FEV1-Post L 1.94  1.93  P  DLCO uncorrected ml/min/mmHg 15.28  16.60  P  DLCO UNC% % 62  49  P  DLCO corrected ml/min/mmHg 16.44  16.45  P  DLCO COR %Predicted % 67  48  P  DLVA Predicted % 106  77  P  TLC L 3.35  3.57  P  TLC % Predicted % 54  58  P  RV % Predicted % 38  56  P    P Preliminary result    No results found for: "NITRICOXIDE"      Assessment & Plan:   No problem-specific Assessment & Plan notes found for this encounter.   Assessment and Plan    Obstructive Sleep Apnea   She has obstructive sleep apnea and has been non-compliant with CPAP therapy due to discomfort and device malfunction. A recent titration study shows optimal  pressure setting is 16 cm H2O. She has not used CPAP in the last three months, opting for supplemental oxygen during sleep. CPAP use is encouraged to prevent apneic events,  Order a new CPAP machine with a pressure setting of 16 cm H2O from [employer]. Instruct her to use CPAP for at least four hours per night to ensure insurance coverage and reduce cardiac stress.  Chronic Obstructive Pulmonary Disease (COPD)   She has COPD and uses Trelegy inhaler once daily. She has not required albuterol  She quit smoking in October 2023 a. Continue Trelegy inhaler, one puff daily, and advise rinsing mouth after use. Continue albuterol inhaler as needed. Encourage continued smoking cessation. Monitor weight and encourage further weight loss.  Chronic Respiratory Failure   She has chronic respiratory failure and uses supplemental oxygen during sleep, maintaining oxygen levels above 90% at home. Continue supplemental oxygen during with activity at 2 L/min.to keep O2 sats >88-90%.   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  Patient has quit smoking in 2023.  Clinically appears to be stable.  Had CT chest as part of the lung cancer screening program last month.  Results are pending.  Follow-up   Follow-up in 3 to 4 months and as needed      Rubye Oaks, NP 06/28/2023

## 2023-06-28 NOTE — Patient Instructions (Addendum)
 Order for new CPAP.  Restart CPAP At bedtime  -wear all night long for at least 6hr or more   Continue on Trelegy 1 puff daily, rinse after use.  Albuterol inhaler As needed    Continue with yearly CT chest screening program.   Great on job on weight loss.   Continue on Oxygen 2l/m with activity as needed to keep O2 sats >88-90%.   Follow up with Dr. Vassie Loll  in 3-4 months and As needed

## 2023-07-04 ENCOUNTER — Encounter: Payer: Self-pay | Admitting: Podiatry

## 2023-07-04 ENCOUNTER — Ambulatory Visit (INDEPENDENT_AMBULATORY_CARE_PROVIDER_SITE_OTHER): Admitting: Podiatry

## 2023-07-04 DIAGNOSIS — E1142 Type 2 diabetes mellitus with diabetic polyneuropathy: Secondary | ICD-10-CM

## 2023-07-04 DIAGNOSIS — M79675 Pain in left toe(s): Secondary | ICD-10-CM

## 2023-07-04 DIAGNOSIS — M79674 Pain in right toe(s): Secondary | ICD-10-CM | POA: Diagnosis not present

## 2023-07-04 DIAGNOSIS — B351 Tinea unguium: Secondary | ICD-10-CM

## 2023-07-04 DIAGNOSIS — Z7901 Long term (current) use of anticoagulants: Secondary | ICD-10-CM

## 2023-07-04 NOTE — Progress Notes (Signed)
 Subjective:  Patient ID: Sharon Vasquez, female    DOB: Aug 03, 1950,   [name]: [id number]  Chief Complaint  Patient presents with   Nail Problem    73 y.o. female presents for concern of thickened elongated and painful nails that are difficult to trim. Requesting to have them trimmed today. Relates burning and tingling in their feet. Patient is diabetic and last A1c was  Lab Results  Component Value Date   HGBA1C 6.1 (A) 05/18/2023   . She is on chronic anticoagulation and at risk for foot care.   PCP:  [name], DO    . Denies any other pedal complaints. Denies n/v/f/c.   Past Medical History:  Diagnosis Date   A-fib (HCC)    Acute cystitis without hematuria 11/02/2022   Acute hypoxemic respiratory failure (HCC) 02/20/2022   Acute on chronic HFrEF (heart failure with reduced ejection fraction) (HCC) 10/28/2022   Acute on chronic hypoxic respiratory failure (HCC) 01/04/2022   Acute upper respiratory infection 01/08/2022   Anxiety    Arthritis    "qwhre; joints, back" (04/17/2017)   Atrial fibrillation (HCC)    Benign breast cyst in female, left 01/08/2017   Found by Screening mammogram, evaluated by U/S on 01/08/17 and determined to be a benign simple breast cyst.   Breast cancer (HCC)    Cellulitis of left lower leg 05/30/2017   CHF (congestive heart failure) (HCC)    Chronic low back pain 08/21/2016   Chronic lower back pain    Chronic venous insufficiency    /notes 05/30/2017   COPD (chronic obstructive pulmonary disease) (HCC)    Depression    Diabetes (HCC) 01/08/2022   Diabetes mellitus without complication (HCC)    DVT (deep venous thrombosis) (HCC) 11/16/2016   Dysrhythmia    Esophageal dysmotility 11/10/2019   Previous workup by ENT with fiberoptic leryngoscopy which was normal. Dx with laryngeal pharyngeal reflux.   Foul smelling urine 05/04/2022   GERD (gastroesophageal reflux disease)    Headache    "weekly for the last 3 months" (04/17/2017)   History of  hypokalemia 11/17/2022   History of pulmonary embolism    Pulmonary embolism   Hyperlipidemia    Hypertension    Hypokalemia 12/01/2022   Laryngopharyngeal reflux 12/18/2015   Morbid obesity (HCC)    PE (pulmonary embolism)    Pulmonary embolism (HCC) 09/21/2014   Right foot pain 03/03/2021   Sleep apnea    Stroke (HCC) 12/02/2021   Vocal cord polyp 12/18/2015    Objective:  Physical Exam: Vascular: DP/PT pulses 2/4 bilateral. CFT <3 seconds. Absent hair growth on digits. Edema noted to bilateral lower extremities. Xerosis noted bilaterally.  Skin. No lacerations or abrasions bilateral feet. Nails 1-5 bilateral  are thickened discolored and elongated with subungual debris.  Musculoskeletal: MMT 5/5 bilateral lower extremities in DF, PF, Inversion and Eversion. Deceased ROM in DF of ankle joint.  Neurological: Sensation intact to light touch. Protective sensation diminished bilateral.    Assessment:   1. Dermatophytosis of nail   2. Pain in toes of both feet   3. Chronic anticoagulation      Plan:  Patient was evaluated and treated and all questions answered. -Discussed and educated patient on diabetic foot care, especially with  regards to the vascular, neurological and musculoskeletal systems.  -Stressed the importance of good glycemic control and the detriment of not  controlling glucose levels in relation to the foot. -Discussed supportive shoes at all times and checking feet regularly.  -  Mechanically debrided all nails 1-5 bilateral using sterile nail nipper and filed with dremel without incident  -Answered all patient questions -Patient to return  in 3 months for at risk foot care -Patient advised to call the office if any problems or questions arise in the meantime.   Louann Sjogren, DPM

## 2023-07-05 NOTE — Progress Notes (Signed)
 Continue with annual low-dose CT screening- RML nodule is unchanged. 4.1 cm ascending aortic aneurysm is stable.

## 2023-07-10 ENCOUNTER — Other Ambulatory Visit: Payer: Self-pay | Admitting: Internal Medicine

## 2023-07-10 DIAGNOSIS — N3941 Urge incontinence: Secondary | ICD-10-CM

## 2023-07-10 NOTE — Telephone Encounter (Signed)
 Medication refilled 06/01/23 with 3 refills.

## 2023-07-19 ENCOUNTER — Telehealth: Payer: Self-pay | Admitting: Primary Care

## 2023-07-19 NOTE — Telephone Encounter (Signed)
 Rc'd fax from [facility] for flutter valve. Will put in Ms. Walsh's file folder/In box for signature.

## 2023-07-21 DIAGNOSIS — M170 Bilateral primary osteoarthritis of knee: Secondary | ICD-10-CM | POA: Diagnosis not present

## 2023-07-21 DIAGNOSIS — J9601 Acute respiratory failure with hypoxia: Secondary | ICD-10-CM | POA: Diagnosis not present

## 2023-07-21 DIAGNOSIS — G4733 Obstructive sleep apnea (adult) (pediatric): Secondary | ICD-10-CM | POA: Diagnosis not present

## 2023-07-21 DIAGNOSIS — J9611 Chronic respiratory failure with hypoxia: Secondary | ICD-10-CM | POA: Diagnosis not present

## 2023-07-21 DIAGNOSIS — R2681 Unsteadiness on feet: Secondary | ICD-10-CM | POA: Diagnosis not present

## 2023-07-23 ENCOUNTER — Other Ambulatory Visit (HOSPITAL_COMMUNITY): Payer: Self-pay | Admitting: Cardiovascular Disease

## 2023-07-23 DIAGNOSIS — I482 Chronic atrial fibrillation, unspecified: Secondary | ICD-10-CM

## 2023-07-23 DIAGNOSIS — Z79899 Other long term (current) drug therapy: Secondary | ICD-10-CM | POA: Diagnosis not present

## 2023-07-23 DIAGNOSIS — M25561 Pain in right knee: Secondary | ICD-10-CM | POA: Diagnosis not present

## 2023-07-23 DIAGNOSIS — G8929 Other chronic pain: Secondary | ICD-10-CM | POA: Diagnosis not present

## 2023-07-23 DIAGNOSIS — M25562 Pain in left knee: Secondary | ICD-10-CM | POA: Diagnosis not present

## 2023-07-24 NOTE — Telephone Encounter (Signed)
 Rc'd signed copy. Fax'd to [phone]

## 2023-07-25 DIAGNOSIS — Z79899 Other long term (current) drug therapy: Secondary | ICD-10-CM | POA: Diagnosis not present

## 2023-08-13 ENCOUNTER — Other Ambulatory Visit: Payer: Self-pay

## 2023-08-13 DIAGNOSIS — I872 Venous insufficiency (chronic) (peripheral): Secondary | ICD-10-CM

## 2023-08-15 ENCOUNTER — Ambulatory Visit: Payer: Self-pay

## 2023-08-15 NOTE — Patient Outreach (Signed)
 Complex Care Management   Visit Note  08/15/2023  Name:  Sharon Vasquez [name]: [id number] DOB: 12-05-1950  Situation: Referral received for Complex Care Management related to Heart Failure I obtained verbal consent from Patient.  Visit completed with patient  on the phone  Background:   Past Medical History:  Diagnosis Date   A-fib (HCC)    Acute cystitis without hematuria 11/02/2022   Acute hypoxemic respiratory failure (HCC) 02/20/2022   Acute on chronic HFrEF (heart failure with reduced ejection fraction) (HCC) 10/28/2022   Acute on chronic hypoxic respiratory failure (HCC) 01/04/2022   Acute upper respiratory infection 01/08/2022   Anxiety    Arthritis    "qwhre; joints, back" (04/17/2017)   Atrial fibrillation (HCC)    Benign breast cyst in female, left 01/08/2017   Found by Screening mammogram, evaluated by U/S on 01/08/17 and determined to be a benign simple breast cyst.   Breast cancer (HCC)    Cellulitis of left lower leg 05/30/2017   CHF (congestive heart failure) (HCC)    Chronic low back pain 08/21/2016   Chronic lower back pain    Chronic venous insufficiency    /notes 05/30/2017   COPD (chronic obstructive pulmonary disease) (HCC)    Depression    Diabetes (HCC) 01/08/2022   Diabetes mellitus without complication (HCC)    DVT (deep venous thrombosis) (HCC) 11/16/2016   Dysrhythmia    Esophageal dysmotility 11/10/2019   Previous workup by ENT with fiberoptic leryngoscopy which was normal. Dx with laryngeal pharyngeal reflux.   Foul smelling urine 05/04/2022   GERD (gastroesophageal reflux disease)    Headache    "weekly for the last 3 months" (04/17/2017)   History of hypokalemia 11/17/2022   History of pulmonary embolism    Pulmonary embolism   Hyperlipidemia    Hypertension    Hypokalemia 12/01/2022   Laryngopharyngeal reflux 12/18/2015   Morbid obesity (HCC)    PE (pulmonary embolism)    Pulmonary embolism (HCC) 09/21/2014   Right foot pain 03/03/2021    Sleep apnea    Stroke (HCC) 12/02/2021   Vocal cord polyp 12/18/2015    Assessment: Patient Reported Symptoms:  Cognitive Cognitive Status: Able to follow simple commands, Alert and oriented to person, place, and time, Normal speech and language skills      Neurological Neurological Review of Symptoms: No symptoms reported    HEENT HEENT Symptoms Reported: No symptoms reported      Cardiovascular Cardiovascular Symptoms Reported: Swelling in legs or feet Cardiovascular Conditions: Heart failure Cardiovascular Management Strategies: Medication therapy  Respiratory Respiratory Symptoms Reported: Shortness of breath Respiratory Conditions: COPD  Endocrine      Gastrointestinal Gastrointestinal Symptoms Reported: Constipation Additional Gastrointestinal Details: every  once in a while but she uses her medication to help Gastrointestinal Conditions: Constipation Gastrointestinal Management Strategies: Medication therapy    Genitourinary Genitourinary Symptoms Reported: Incontinence Genitourinary Conditions: Incontinence Genitourinary Management Strategies: Medication therapy  Integumentary Integumentary Symptoms Reported: Other Other Integumentary Symptoms: Dry skin Skin Management Strategies: Medication therapy  Musculoskeletal Musculoskelatal Symptoms Reviewed: Difficulty walking, Weakness, Unsteady gait Musculoskeletal Conditions: Back pain Musculoskeletal Management Strategies: Medical device, Medication therapy Falls in the past year?: No    Psychosocial       Quality of Family Relationships: supportive Do you feel physically threatened by others?: No      08/15/2023   10:11 AM  Depression screen PHQ 2/9  Decreased Interest 0  Down, Depressed, Hopeless 0  PHQ - 2 Score 0  There were no vitals filed for this visit.  Medications Reviewed Today     Reviewed by Augustin Leber, RN (Registered Nurse) on 08/15/23 at [phone]  Med List Status: <None>   Medication Order  Taking? Sig Documenting Provider Last Dose Status Informant  albuterol  (VENTOLIN  HFA) 108 (90 Base) MCG/ACT inhaler [id number] Yes Inhale 2 puffs into the lungs every 4 (four) hours as needed for shortness of breath. Atway, Rayann N, DO Taking Active Pharmacy Records, Self           Med Note Arleta Lade, Herminio Lopes Jan 18, 2023 11:55 AM) Patient has it if she needs it  alendronate  (FOSAMAX ) 70 MG tablet [id number] Yes Take 70 mg by mouth once a week. Take with a full glass of water on an empty stomach, Patient hasn't started medication. [provider] Taking Active Self, Pharmacy Records  atorvastatin  (LIPITOR ) 80 MG tablet [id number] Yes Take 1 tablet (80 mg total) by mouth daily. Masters, Psychiatric nurse, DO Taking Active   empagliflozin  (JARDIANCE ) 10 MG TABS tablet [id number] Yes TAKE 1 TABLET(10 MG) BY MOUTH DAILY BEFORE BREAKFAST O'Neal, Cathay Clonts, MD Taking Active   esomeprazole  (NEXIUM ) 40 MG capsule [id number] Yes Take 1 capsule (40 mg total) by mouth daily as needed (for heartburn or indigestion). Parrett, Macdonald Savoy, NP Taking Active   Fluticasone -Umeclidin-Vilant (TRELEGY ELLIPTA ) 100-62.5-25 MCG/ACT AEPB [id number] Yes Inhale 1 puff into the lungs daily. Nooruddin, Saad, MD Taking Active Self, Pharmacy Records  gabapentin  (NEURONTIN ) 400 MG capsule [id number] Yes Take 1 capsule (400 mg total) by mouth 2 (two) times daily. Masters, Alston Jerry, DO Taking Active   letrozole  (FEMARA ) 2.5 MG tablet [id number] Yes Take 1 tablet (2.5 mg total) by mouth daily. TAKE 1 TABLET(2.5 MG) BY MOUTH DAILY Gudena, Vinay, MD Taking Active   LORazepam  (ATIVAN ) 0.5 MG tablet [id number] Yes Take 0.5 mg by mouth daily as needed for anxiety. [provider] Taking Active Pharmacy Records, Self  losartan  (COZAAR ) 25 MG tablet [id number] Yes Take 1 tablet (25 mg total) by mouth daily. Koomson, Julius, MD Taking Active   metoprolol  succinate (TOPROL -XL) 25 MG 24 hr tablet [id number] Yes Take 1 tablet (25 mg total) by mouth  daily. Atway, Rayann N, DO Taking Active   naloxone (NARCAN) nasal spray 4 mg/0.1 mL [id number] Yes Place 1 spray into the nose once as needed (for overdose suspected). for opioid overdose [provider] Taking Active Pharmacy Records, Self  oxybutynin  (DITROPAN ) 5 MG tablet [id number] Yes TAKE 1 TABLET BY MOUTH THREE TIMES DAILY Atway, Rayann N, DO Taking Active   oxyCODONE  (ROXICODONE ) 15 MG immediate release tablet [id number] Yes Take 15 mg by mouth See admin instructions. Take 15mg  (1 tablet) by mouth every morning and 15mg  (1 tablet) at bedtime in addition to twice during the day as needed. [provider] Taking Active Pharmacy Records, Self  OXYGEN  [id number] Yes Inhale 2-3 L into the lungs daily. [provider] Taking Active Pharmacy Records, Self  potassium chloride  SA (KLOR-CON  M) 20 MEQ tablet [id number] Yes Take 2 tablets (40 mEq total) by mouth 2 (two) times daily. Arleene Belt, [place] Taking Active   PRESCRIPTION MEDICATION [id number] Yes Inhale into the lungs at bedtime. cpap [provider] Taking Active Pharmacy Records, Self  rivaroxaban  (XARELTO ) 20 MG TABS tablet [id number] Yes Take 1 tablet (20 mg total) by mouth every morning. Jinwala, Sagar H, MD Taking Active Pharmacy Records, Self  Semaglutide -Weight Management 2.4 MG/0.75ML [place] [id number] Yes Inject 2.4 mg into the  skin once a week for 28 days. Atway, Rayann N, DO Taking Active   spironolactone  (ALDACTONE ) 25 MG tablet [id number]  Take 1 tablet (25 mg total) by mouth daily. Arleene Belt, PA-C  Expired 06/29/23 2359   Torsemide  60 MG TABS [id number] Yes Take 60 mg by mouth daily. Harrold Lincoln, MD Taking Active             Recommendation:   PCP Follow-up  Follow Up Plan:   Telephone follow up appointment with care management team member scheduled for:  09/25/23  1115 am  Augustin Leber RN, BSN, [facility]  [facility], [facility]  Care  Coordinator Phone: [phone]

## 2023-08-15 NOTE — Patient Instructions (Signed)
 Visit Information  Thank you for taking time to visit with me today. Please don't hesitate to contact me if I can be of assistance to you before our next scheduled appointment.  Your next care management appointment is scheduled for:  09/25/23  1115 am  Please call the care guide team at [phone] if you need to cancel, schedule, or reschedule an appointment.   Please call [phone]-TALK (toll free, 24 hour hotline) if you are experiencing a Mental Health or Behavioral Health Crisis or need someone to talk to.  Augustin Leber RN, BSN, [facility]  [facility], [facility] Health  Care Coordinator Phone: [phone]

## 2023-08-20 ENCOUNTER — Other Ambulatory Visit: Payer: Self-pay | Admitting: Internal Medicine

## 2023-08-20 DIAGNOSIS — M170 Bilateral primary osteoarthritis of knee: Secondary | ICD-10-CM | POA: Diagnosis not present

## 2023-08-20 DIAGNOSIS — G4733 Obstructive sleep apnea (adult) (pediatric): Secondary | ICD-10-CM | POA: Diagnosis not present

## 2023-08-20 DIAGNOSIS — J9611 Chronic respiratory failure with hypoxia: Secondary | ICD-10-CM | POA: Diagnosis not present

## 2023-08-20 DIAGNOSIS — J9601 Acute respiratory failure with hypoxia: Secondary | ICD-10-CM | POA: Diagnosis not present

## 2023-08-20 DIAGNOSIS — R2681 Unsteadiness on feet: Secondary | ICD-10-CM | POA: Diagnosis not present

## 2023-08-21 NOTE — Progress Notes (Deleted)
 Office Note     CC: Edema Requesting Provider:  Harrold Lincoln, *  HPI: Sharon Vasquez is a 73 y.o. (December 31, 1950) female who presents at the request of Atway, Rayann N, DO for evaluation of lymphedema.   Venous symptoms include: positive if (X) [  ] aching [  ] heavy [  ] tired  [  ] throbbing [  ] burning  [  ] itching [  ]swelling [  ] bleeding [  ] ulcer  Onset/duration:  ***  Occupation:  *** Aggravating factors: (sitting, standing) Alleviating factors: (elevation) Compression:  *** Helps:  *** Pain medications:  *** Previous vein procedures:  *** History of DVT:  ***   The pt *** on a statin for cholesterol management.  The pt *** on a daily aspirin .   Other AC:  *** The pt *** on *** for hypertension.   The pt *** diabetic.  *** Tobacco hx:  ***  Past Medical History:  Diagnosis Date   A-fib (HCC)    Acute cystitis without hematuria 11/02/2022   Acute hypoxemic respiratory failure (HCC) 02/20/2022   Acute on chronic HFrEF (heart failure with reduced ejection fraction) (HCC) 10/28/2022   Acute on chronic hypoxic respiratory failure (HCC) 01/04/2022   Acute upper respiratory infection 01/08/2022   Anxiety    Arthritis    "qwhre; joints, back" (04/17/2017)   Atrial fibrillation (HCC)    Benign breast cyst in female, left 01/08/2017   Found by Screening mammogram, evaluated by U/S on 01/08/17 and determined to be a benign simple breast cyst.   Breast cancer (HCC)    Cellulitis of left lower leg 05/30/2017   CHF (congestive heart failure) (HCC)    Chronic low back pain 08/21/2016   Chronic lower back pain    Chronic venous insufficiency    /notes 05/30/2017   COPD (chronic obstructive pulmonary disease) (HCC)    Depression    Diabetes (HCC) 01/08/2022   Diabetes mellitus without complication (HCC)    DVT (deep venous thrombosis) (HCC) 11/16/2016   Dysrhythmia    Esophageal dysmotility 11/10/2019   Previous workup by ENT with fiberoptic leryngoscopy  which was normal. Dx with laryngeal pharyngeal reflux.   Foul smelling urine 05/04/2022   GERD (gastroesophageal reflux disease)    Headache    "weekly for the last 3 months" (04/17/2017)   History of hypokalemia 11/17/2022   History of pulmonary embolism    Pulmonary embolism   Hyperlipidemia    Hypertension    Hypokalemia 12/01/2022   Laryngopharyngeal reflux 12/18/2015   Morbid obesity (HCC)    PE (pulmonary embolism)    Pulmonary embolism (HCC) 09/21/2014   Right foot pain 03/03/2021   Sleep apnea    Stroke (HCC) 12/02/2021   Vocal cord polyp 12/18/2015    Past Surgical History:  Procedure Laterality Date   ABDOMINAL HYSTERECTOMY     APPENDECTOMY     BALLOON DILATION N/A 03/09/2020   Procedure: BALLOON DILATION;  Surgeon: Felecia Hopper, MD;  Location: [facility];  Service: Gastroenterology;  Laterality: N/A;   BIOPSY  03/09/2020   Procedure: BIOPSY;  Surgeon: Felecia Hopper, MD;  Location: [facility];  Service: Gastroenterology;;   BIOPSY  04/12/2021   Procedure: BIOPSY;  Surgeon: Felecia Hopper, MD;  Location: [facility];  Service: Gastroenterology;;   BREAST BIOPSY Right 03/11/2021   us  biopsy/ coil clip/ path pending   BREAST BIOPSY Right 03/11/2021   us  biopsy/ ribbone clip/ path pending   BREAST CYST EXCISION  Left    "six o'clock"   BREAST LUMPECTOMY Left    BREAST LUMPECTOMY WITH RADIOACTIVE SEED LOCALIZATION Right 05/02/2021   Procedure: RIGHT BREAST LUMPECTOMY WITH RADIOACTIVE SEED LOCALIZATION;  Surgeon: Caralyn Chandler, MD;  Location: [facility] OR;  Service: General;  Laterality: Right;   CARDIAC CATHETERIZATION     CHOLECYSTECTOMY     DILATION AND CURETTAGE OF UTERUS     ESOPHAGOGASTRODUODENOSCOPY (EGD) WITH PROPOFOL  N/A 03/09/2020   Procedure: ESOPHAGOGASTRODUODENOSCOPY (EGD) WITH PROPOFOL ;  Surgeon: Felecia Hopper, MD;  Location: [facility];  Service: Gastroenterology;  Laterality: N/A;   ESOPHAGOGASTRODUODENOSCOPY (EGD) WITH PROPOFOL  N/A  04/12/2021   Procedure: ESOPHAGOGASTRODUODENOSCOPY (EGD) WITH PROPOFOL ;  Surgeon: Felecia Hopper, MD;  Location: [facility];  Service: Gastroenterology;  Laterality: N/A;   IR ABLATE LIVER CRYOABLATION  07/23/2019   IR RADIOLOGIST EVAL & MGMT  07/18/2019   RIGHT/LEFT HEART CATH AND CORONARY ANGIOGRAPHY N/A 11/01/2022   Procedure: RIGHT/LEFT HEART CATH AND CORONARY ANGIOGRAPHY;  Surgeon: [place], Peter M, MD;  Location: [facility] INVASIVE CV LAB;  Service: Cardiovascular;  Laterality: N/A;   SAVORY DILATION N/A 04/12/2021   Procedure: SAVORY DILATION;  Surgeon: Felecia Hopper, MD;  Location: [facility];  Service: Gastroenterology;  Laterality: N/A;   TONSILLECTOMY AND ADENOIDECTOMY     TUBAL LIGATION      Social History   Socioeconomic History   Marital status: Divorced    Spouse name: Not on file   Number of children: Not on file   Years of education: Not on file   Highest education level: Not on file  Occupational History   Not on file  Tobacco Use   Smoking status: Former    Current packs/day: 0.00    Average packs/day: 2.0 packs/day for 60.4 years (120.8 ttl pk-yrs)    Types: Cigarettes    Start date: 08/16/1961    Quit date: 01/02/2022    Years since quitting: 1.6   Smokeless tobacco: Never   Tobacco comments:    Pt smokes about 4 ciggs daily. Stopped 01/02/2022   Vaping Use   Vaping status: Never Used  Substance and Sexual Activity   Alcohol use: No   Drug use: No   Sexual activity: Not Currently    Partners: Male  Other Topics Concern   Not on file  Social History Narrative   [facility] [place]. Lives alone, but is dependent in ADLs/IADLs. Previously had HH PT and RN but dismissed them with plans to use the [employer]. Two daughters live nearby. Previously resided in [place].   Social Drivers of Health   Financial Resource Strain: Medium Risk (11/16/2022)   Overall Financial Resource Strain (CARDIA)    Difficulty of Paying Living Expenses:  Somewhat hard  Food Insecurity: No Food Insecurity (12/01/2022)   Hunger Vital Sign    Worried About Running Out of Food in the Last Year: Never true    Ran Out of Food in the Last Year: Never true  Transportation Needs: No Transportation Needs (12/01/2022)   PRAPARE - Administrator, Civil Service (Medical): No    Lack of Transportation (Non-Medical): No  Physical Activity: Inactive (11/16/2022)   Exercise Vital Sign    Days of Exercise per Week: 0 days    Minutes of Exercise per Session: 0 min  Stress: No Stress Concern Present (12/02/2021)   [employer] of Occupational Health - Occupational Stress Questionnaire    Feeling of Stress : Only a little  Social Connections: Moderately Integrated (03/15/2022)  Social Advertising account executive [NHANES]    Frequency of Communication with Friends and Family: More than three times a week    Frequency of Social Gatherings with Friends and Family: More than three times a week    Attends Religious Services: More than 4 times per year    Active Member of [employer] or Organizations: Yes    Attends Engineer, structural: More than 4 times per year    Marital Status: Divorced  Intimate Partner Violence: Not At Risk (12/01/2022)   Humiliation, Afraid, Rape, and Kick questionnaire    Fear of Current or Ex-Partner: No    Emotionally Abused: No    Physically Abused: No    Sexually Abused: No   *** Family History  Problem Relation Age of Onset   Breast cancer Mother        before age 44   Hypertension Mother    Hyperlipidemia Mother    Hypertension Maternal Grandfather    Hyperlipidemia Maternal Grandfather     Current Outpatient Medications  Medication Sig Dispense Refill   albuterol  (VENTOLIN  HFA) 108 (90 Base) MCG/ACT inhaler Inhale 2 puffs into the lungs every 4 (four) hours as needed for shortness of breath. 18 g 1   alendronate  (FOSAMAX ) 70 MG tablet Take 70 mg by mouth once a week. Take with a full glass of water  on an empty stomach, Patient hasn't started medication.     atorvastatin  (LIPITOR ) 80 MG tablet Take 1 tablet (80 mg total) by mouth daily. 100 tablet 2   empagliflozin  (JARDIANCE ) 10 MG TABS tablet TAKE 1 TABLET(10 MG) BY MOUTH DAILY BEFORE BREAKFAST 30 tablet 11   esomeprazole  (NEXIUM ) 40 MG capsule Take 1 capsule (40 mg total) by mouth daily as needed (for heartburn or indigestion). 90 capsule 1   Fluticasone -Umeclidin-Vilant (TRELEGY ELLIPTA ) 100-62.5-25 MCG/ACT AEPB Inhale 1 puff into the lungs daily. 60 each 5   gabapentin  (NEURONTIN ) 400 MG capsule Take 1 capsule (400 mg total) by mouth 2 (two) times daily. 60 capsule 2   letrozole  (FEMARA ) 2.5 MG tablet Take 1 tablet (2.5 mg total) by mouth daily. TAKE 1 TABLET(2.5 MG) BY MOUTH DAILY 90 tablet 3   LORazepam  (ATIVAN ) 0.5 MG tablet Take 0.5 mg by mouth daily as needed for anxiety.     losartan  (COZAAR ) 25 MG tablet Take 1 tablet (25 mg total) by mouth daily. 30 tablet 11   metoprolol  succinate (TOPROL -XL) 25 MG 24 hr tablet Take 1 tablet (25 mg total) by mouth daily. 100 tablet 2   naloxone (NARCAN) nasal spray 4 mg/0.1 mL Place 1 spray into the nose once as needed (for overdose suspected). for opioid overdose     oxybutynin  (DITROPAN ) 5 MG tablet TAKE 1 TABLET BY MOUTH THREE TIMES DAILY 90 tablet 3   oxyCODONE  (ROXICODONE ) 15 MG immediate release tablet Take 15 mg by mouth See admin instructions. Take 15mg  (1 tablet) by mouth every morning and 15mg  (1 tablet) at bedtime in addition to twice during the day as needed.     OXYGEN  Inhale 2-3 L into the lungs daily.     potassium chloride  SA (KLOR-CON  M) 20 MEQ tablet Take 2 tablets (40 mEq total) by mouth 2 (two) times daily. 90 tablet 3   PRESCRIPTION MEDICATION Inhale into the lungs at bedtime. cpap     rivaroxaban  (XARELTO ) 20 MG TABS tablet Take 1 tablet (20 mg total) by mouth every morning. 90 tablet 3   spironolactone  (ALDACTONE ) 25 MG tablet Take  1 tablet (25 mg total) by mouth daily. 90  tablet 3   Torsemide  60 MG TABS Take 60 mg by mouth daily. 90 tablet 3   WEGOVY  2.4 MG/0.75ML SOAJ ADMINISTER 2.4 MG UNDER THE SKIN 1 TIME A WEEK 3 mL 1   No current facility-administered medications for this visit.    No Known Allergies   REVIEW OF SYSTEMS:  *** [X]  denotes positive finding, [ ]  denotes negative finding Cardiac  Comments:  Chest pain or chest pressure:    Shortness of breath upon exertion:    Short of breath when lying flat:    Irregular heart rhythm:        Vascular    Pain in calf, thigh, or hip brought on by ambulation:    Pain in feet at night that wakes you up from your sleep:     Blood clot in your veins:    Leg swelling:         Pulmonary    Oxygen  at home:    Productive cough:     Wheezing:         Neurologic    Sudden weakness in arms or legs:     Sudden numbness in arms or legs:     Sudden onset of difficulty speaking or slurred speech:    Temporary loss of vision in one eye:     Problems with dizziness:         Gastrointestinal    Blood in stool:     Vomited blood:         Genitourinary    Burning when urinating:     Blood in urine:        Psychiatric    Major depression:         Hematologic    Bleeding problems:    Problems with blood clotting too easily:        Skin    Rashes or ulcers:        Constitutional    Fever or chills:      PHYSICAL EXAMINATION:  There were no vitals filed for this visit.  General:  WDWN in NAD; vital signs documented above Gait: Not observed HENT: WNL, normocephalic Pulmonary: normal non-labored breathing , without Rales, rhonchi,  wheezing Cardiac: {Desc; regular/irreg:14544} HR, without  Murmurs {With/Without:20273} carotid bruit*** Abdomen: soft, NT, no masses Skin: {With/Without:20273} rashes Vascular Exam/Pulses:  Right Left  Radial {Exam; arterial pulse strength 0-4:30167} {Exam; arterial pulse strength 0-4:30167}  Ulnar {Exam; arterial pulse strength 0-4:30167} {Exam; arterial  pulse strength 0-4:30167}  Femoral {Exam; arterial pulse strength 0-4:30167} {Exam; arterial pulse strength 0-4:30167}  Popliteal {Exam; arterial pulse strength 0-4:30167} {Exam; arterial pulse strength 0-4:30167}  DP {Exam; arterial pulse strength 0-4:30167} {Exam; arterial pulse strength 0-4:30167}  PT {Exam; arterial pulse strength 0-4:30167} {Exam; arterial pulse strength 0-4:30167}   Extremities: {With/Without:20273} ischemic changes, {With/Without:20273} Gangrene , {With/Without:20273} cellulitis; {With/Without:20273} open wounds;  Musculoskeletal: no muscle wasting or atrophy  Neurologic: A&O X 3;  No focal weakness or paresthesias are detected Psychiatric:  The pt has {Desc; normal/abnormal:11317::"Normal"} affect.   Non-Invasive Vascular Imaging:   ***    ASSESSMENT/PLAN:: 73 y.o. female presenting with ***   ***   Kayla Part, MD [facility] [phone]

## 2023-08-22 DIAGNOSIS — G8929 Other chronic pain: Secondary | ICD-10-CM | POA: Diagnosis not present

## 2023-08-22 DIAGNOSIS — M25561 Pain in right knee: Secondary | ICD-10-CM | POA: Diagnosis not present

## 2023-08-22 DIAGNOSIS — M25562 Pain in left knee: Secondary | ICD-10-CM | POA: Diagnosis not present

## 2023-08-22 DIAGNOSIS — Z79899 Other long term (current) drug therapy: Secondary | ICD-10-CM | POA: Diagnosis not present

## 2023-08-23 ENCOUNTER — Ambulatory Visit: Admitting: Vascular Surgery

## 2023-08-23 ENCOUNTER — Ambulatory Visit (HOSPITAL_COMMUNITY)
Admission: RE | Admit: 2023-08-23 | Discharge: 2023-08-23 | Disposition: A | Discharge status: Home / Self Care | Source: Ambulatory Visit | Attending: Vascular Surgery | Admitting: Vascular Surgery

## 2023-08-23 ENCOUNTER — Ambulatory Visit (INDEPENDENT_AMBULATORY_CARE_PROVIDER_SITE_OTHER): Admitting: Vascular Surgery

## 2023-08-23 ENCOUNTER — Encounter (HOSPITAL_COMMUNITY): Payer: Self-pay

## 2023-08-23 ENCOUNTER — Encounter: Payer: Self-pay | Admitting: Vascular Surgery

## 2023-08-23 VITALS — BP 120/84 | HR 88 | Temp 97.7°F | Ht 74.0 in | Wt 342.0 lb

## 2023-08-23 DIAGNOSIS — R609 Edema, unspecified: Secondary | ICD-10-CM | POA: Diagnosis not present

## 2023-08-23 DIAGNOSIS — I890 Lymphedema, not elsewhere classified: Secondary | ICD-10-CM | POA: Diagnosis not present

## 2023-08-23 DIAGNOSIS — I872 Venous insufficiency (chronic) (peripheral): Secondary | ICD-10-CM

## 2023-08-23 NOTE — Progress Notes (Signed)
 Office Note     CC: Edema Requesting Provider:  Harrold Lincoln, *  HPI: KHYLER Vasquez is a 73 y.o. (Apr 12, 1950) female who presents at the request of Atway, Rayann N, DO for evaluation of lymphedema.   On exam, she was doing well, accompanied by her daughter.  A native of [place] , moved to [place]  years ago.  She lives in both places for quite some time prior to losing more of her mobility.  A CNA by trade, she is now retired.  Maysoon presented in a motorized wheelchair.  She has had trouble with ambulation for quite some time.  She has contracture of bilateral knees, and is able to stand, but not to ambulate.  She has had bilateral lower extremity edema for years, however this is continued to worsen.  She has chronic skin changes for years as well. She sleeps in a hospital bed, and is currently living at [facility].  When sleeping at night, she tries to elevate her legs, but usually this is at the level of the knee due to the contracture.  At she has been seen at the wound care center previously, and has undergone Unna boot for wounds.  She has chronic skin changes for years as well.  During today's visit, she denied wounds, ulceration.  No history of peripheral arterial insufficiency.  Denies claudication, ischemic rest pain, tissue loss. No history of DVT.   Past Medical History:  Diagnosis Date   A-fib (HCC)    Acute cystitis without hematuria 11/02/2022   Acute hypoxemic respiratory failure (HCC) 02/20/2022   Acute on chronic HFrEF (heart failure with reduced ejection fraction) (HCC) 10/28/2022   Acute on chronic hypoxic respiratory failure (HCC) 01/04/2022   Acute upper respiratory infection 01/08/2022   Anxiety    Arthritis    "qwhre; joints, back" (04/17/2017)   Atrial fibrillation (HCC)    Benign breast cyst in female, left 01/08/2017   Found by Screening mammogram, evaluated by U/S on 01/08/17 and determined to be a benign simple breast cyst.    Breast cancer (HCC)    Cellulitis of left lower leg 05/30/2017   CHF (congestive heart failure) (HCC)    Chronic low back pain 08/21/2016   Chronic lower back pain    Chronic venous insufficiency    /notes 05/30/2017   COPD (chronic obstructive pulmonary disease) (HCC)    Depression    Diabetes (HCC) 01/08/2022   Diabetes mellitus without complication (HCC)    DVT (deep venous thrombosis) (HCC) 11/16/2016   Dysrhythmia    Esophageal dysmotility 11/10/2019   Previous workup by ENT with fiberoptic leryngoscopy which was normal. Dx with laryngeal pharyngeal reflux.   Foul smelling urine 05/04/2022   GERD (gastroesophageal reflux disease)    Headache    "weekly for the last 3 months" (04/17/2017)   History of hypokalemia 11/17/2022   History of pulmonary embolism    Pulmonary embolism   Hyperlipidemia    Hypertension    Hypokalemia 12/01/2022   Laryngopharyngeal reflux 12/18/2015   Morbid obesity (HCC)    PE (pulmonary embolism)    Pulmonary embolism (HCC) 09/21/2014   Right foot pain 03/03/2021   Sleep apnea    Stroke (HCC) 12/02/2021   Vocal cord polyp 12/18/2015    Past Surgical History:  Procedure Laterality Date   ABDOMINAL HYSTERECTOMY     APPENDECTOMY     BALLOON DILATION N/A 03/09/2020   Procedure: BALLOON DILATION;  Surgeon: Felecia Hopper, MD;  Location: [facility];  Service: Gastroenterology;  Laterality: N/A;   BIOPSY  03/09/2020   Procedure: BIOPSY;  Surgeon: Felecia Hopper, MD;  Location: [facility];  Service: Gastroenterology;;   BIOPSY  04/12/2021   Procedure: BIOPSY;  Surgeon: Felecia Hopper, MD;  Location: [facility];  Service: Gastroenterology;;   BREAST BIOPSY Right 03/11/2021   us  biopsy/ coil clip/ path pending   BREAST BIOPSY Right 03/11/2021   us  biopsy/ ribbone clip/ path pending   BREAST CYST EXCISION Left    "six o'clock"   BREAST LUMPECTOMY Left    BREAST LUMPECTOMY WITH RADIOACTIVE SEED LOCALIZATION Right 05/02/2021    Procedure: RIGHT BREAST LUMPECTOMY WITH RADIOACTIVE SEED LOCALIZATION;  Surgeon: Caralyn Chandler, MD;  Location: [facility];  Service: General;  Laterality: Right;   CARDIAC CATHETERIZATION     CHOLECYSTECTOMY     DILATION AND CURETTAGE OF UTERUS     ESOPHAGOGASTRODUODENOSCOPY (EGD) WITH PROPOFOL  N/A 03/09/2020   Procedure: ESOPHAGOGASTRODUODENOSCOPY (EGD) WITH PROPOFOL ;  Surgeon: Felecia Hopper, MD;  Location: [facility];  Service: Gastroenterology;  Laterality: N/A;   ESOPHAGOGASTRODUODENOSCOPY (EGD) WITH PROPOFOL  N/A 04/12/2021   Procedure: ESOPHAGOGASTRODUODENOSCOPY (EGD) WITH PROPOFOL ;  Surgeon: Felecia Hopper, MD;  Location: [facility];  Service: Gastroenterology;  Laterality: N/A;   IR ABLATE LIVER CRYOABLATION  07/23/2019   IR RADIOLOGIST EVAL & MGMT  07/18/2019   RIGHT/LEFT HEART CATH AND CORONARY ANGIOGRAPHY N/A 11/01/2022   Procedure: RIGHT/LEFT HEART CATH AND CORONARY ANGIOGRAPHY;  Surgeon: Swaziland, Peter M, MD;  Location: [facility];  Service: Cardiovascular;  Laterality: N/A;   SAVORY DILATION N/A 04/12/2021   Procedure: SAVORY DILATION;  Surgeon: Felecia Hopper, MD;  Location: [facility];  Service: Gastroenterology;  Laterality: N/A;   TONSILLECTOMY AND ADENOIDECTOMY     TUBAL LIGATION      Social History   Socioeconomic History   Marital status: Divorced    Spouse name: Not on file   Number of children: Not on file   Years of education: Not on file   Highest education level: Not on file  Occupational History   Not on file  Tobacco Use   Smoking status: Former    Current packs/day: 0.00    Average packs/day: 2.0 packs/day for 60.4 years (120.8 ttl pk-yrs)    Types: Cigarettes    Start date: 08/16/1961    Quit date: 01/02/2022    Years since quitting: 1.6   Smokeless tobacco: Never   Tobacco comments:    Pt smokes about 4 ciggs daily. Stopped 01/02/2022   Vaping Use   Vaping status: Never Used  Substance and Sexual Activity   Alcohol use: No    Drug use: No   Sexual activity: Not Currently    Partners: Male  Other Topics Concern   Not on file  Social History Narrative   Lives in [facility] in [place]. Lives alone, but is dependent in ADLs/IADLs. Previously had HH PT and RN but dismissed them with plans to use the [employer]. Two daughters live nearby. Previously resided in [place].   Social Drivers of Health   Financial Resource Strain: Medium Risk (11/16/2022)   Overall Financial Resource Strain (CARDIA)    Difficulty of Paying Living Expenses: Somewhat hard  Food Insecurity: No Food Insecurity (12/01/2022)   Hunger Vital Sign    Worried About Running Out of Food in the Last Year: Never true    Ran Out of Food in the Last Year: Never true  Transportation Needs: No Transportation Needs (12/01/2022)   PRAPARE -  Administrator, Civil Service (Medical): No    Lack of Transportation (Non-Medical): No  Physical Activity: Inactive (11/16/2022)   Exercise Vital Sign    Days of Exercise per Week: 0 days    Minutes of Exercise per Session: 0 min  Stress: No Stress Concern Present (12/02/2021)   [employer] of Occupational Health - Occupational Stress Questionnaire    Feeling of Stress : Only a little  Social Connections: Moderately Integrated (03/15/2022)   Social Connection and Isolation Panel [NHANES]    Frequency of Communication with Friends and Family: More than three times a week    Frequency of Social Gatherings with Friends and Family: More than three times a week    Attends Religious Services: More than 4 times per year    Active Member of [employer] or Organizations: Yes    Attends Engineer, structural: More than 4 times per year    Marital Status: Divorced  Intimate Partner Violence: Not At Risk (12/01/2022)   Humiliation, Afraid, Rape, and Kick questionnaire    Fear of Current or Ex-Partner: No    Emotionally Abused: No    Physically Abused: No    Sexually Abused: No   Family History   Problem Relation Age of Onset   Breast cancer Mother        before age 86   Hypertension Mother    Hyperlipidemia Mother    Hypertension Maternal Grandfather    Hyperlipidemia Maternal Grandfather     Current Outpatient Medications  Medication Sig Dispense Refill   albuterol  (VENTOLIN  HFA) 108 (90 Base) MCG/ACT inhaler Inhale 2 puffs into the lungs every 4 (four) hours as needed for shortness of breath. 18 g 1   alendronate  (FOSAMAX ) 70 MG tablet Take 70 mg by mouth once a week. Take with a full glass of water on an empty stomach, Patient hasn't started medication.     atorvastatin  (LIPITOR ) 80 MG tablet Take 1 tablet (80 mg total) by mouth daily. 100 tablet 2   empagliflozin  (JARDIANCE ) 10 MG TABS tablet TAKE 1 TABLET(10 MG) BY MOUTH DAILY BEFORE BREAKFAST 30 tablet 11   esomeprazole  (NEXIUM ) 40 MG capsule Take 1 capsule (40 mg total) by mouth daily as needed (for heartburn or indigestion). 90 capsule 1   Fluticasone -Umeclidin-Vilant (TRELEGY ELLIPTA ) 100-62.5-25 MCG/ACT AEPB Inhale 1 puff into the lungs daily. 60 each 5   gabapentin  (NEURONTIN ) 400 MG capsule Take 1 capsule (400 mg total) by mouth 2 (two) times daily. 60 capsule 2   letrozole  (FEMARA ) 2.5 MG tablet Take 1 tablet (2.5 mg total) by mouth daily. TAKE 1 TABLET(2.5 MG) BY MOUTH DAILY 90 tablet 3   LORazepam  (ATIVAN ) 0.5 MG tablet Take 0.5 mg by mouth daily as needed for anxiety.     losartan  (COZAAR ) 25 MG tablet Take 1 tablet (25 mg total) by mouth daily. 30 tablet 11   metoprolol  succinate (TOPROL -XL) 25 MG 24 hr tablet Take 1 tablet (25 mg total) by mouth daily. 100 tablet 2   naloxone (NARCAN) nasal spray 4 mg/0.1 mL Place 1 spray into the nose once as needed (for overdose suspected). for opioid overdose     oxybutynin  (DITROPAN ) 5 MG tablet TAKE 1 TABLET BY MOUTH THREE TIMES DAILY 90 tablet 3   oxyCODONE  (ROXICODONE ) 15 MG immediate release tablet Take 15 mg by mouth See admin instructions. Take 15mg  (1 tablet) by mouth  every morning and 15mg  (1 tablet) at bedtime in addition to twice during the day  as needed.     OXYGEN  Inhale 2-3 L into the lungs daily.     potassium chloride  SA (KLOR-CON  M) 20 MEQ tablet Take 2 tablets (40 mEq total) by mouth 2 (two) times daily. 90 tablet 3   PRESCRIPTION MEDICATION Inhale into the lungs at bedtime. cpap     rivaroxaban  (XARELTO ) 20 MG TABS tablet Take 1 tablet (20 mg total) by mouth every morning. 90 tablet 3   Torsemide  60 MG TABS Take 60 mg by mouth daily. 90 tablet 3   WEGOVY  2.4 MG/0.75ML SOAJ ADMINISTER 2.4 MG UNDER THE SKIN 1 TIME A WEEK 3 mL 1   spironolactone  (ALDACTONE ) 25 MG tablet Take 1 tablet (25 mg total) by mouth daily. 90 tablet 3   No current facility-administered medications for this visit.    No Known Allergies   REVIEW OF SYSTEMS:  [X]  denotes positive finding, [ ]  denotes negative finding Cardiac  Comments:  Chest pain or chest pressure:    Shortness of breath upon exertion:    Short of breath when lying flat:    Irregular heart rhythm:        Vascular    Pain in calf, thigh, or hip brought on by ambulation:    Pain in feet at night that wakes you up from your sleep:     Blood clot in your veins:    Leg swelling:         Pulmonary    Oxygen  at home:    Productive cough:     Wheezing:         Neurologic    Sudden weakness in arms or legs:     Sudden numbness in arms or legs:     Sudden onset of difficulty speaking or slurred speech:    Temporary loss of vision in one eye:     Problems with dizziness:         Gastrointestinal    Blood in stool:     Vomited blood:         Genitourinary    Burning when urinating:     Blood in urine:        Psychiatric    Major depression:         Hematologic    Bleeding problems:    Problems with blood clotting too easily:        Skin    Rashes or ulcers:        Constitutional    Fever or chills:      PHYSICAL EXAMINATION:  Vitals:   08/23/23 1027  BP: 120/84  Pulse: 88   Temp: 97.7 F (36.5 C)  SpO2: 94%  Weight: (!) 342 lb (155.1 kg)  Height: 6\' 2"  (1.88 m)    General:  WDWN in NAD; vital signs documented above Gait: Not observed HENT: WNL, normocephalic Pulmonary: normal non-labored breathing , without Rales, rhonchi,  wheezing Cardiac: regular HR Abdomen: soft, NT, no masses Skin: without rashes Vascular Exam/Pulses:  Right Left  Radial 2+ (normal) 2+ (normal)  Ulnar    Femoral    Popliteal    DP 2+ (normal) 2+ (normal)  PT     Extremities: without ischemic changes, without Gangrene , without cellulitis; without open wounds;  Musculoskeletal: no muscle wasting or atrophy  Neurologic: A&O X 3;  No focal weakness or paresthesias are detected Psychiatric:  The pt has Normal affect.   Non-Invasive Vascular Imaging:   Unable to perform bilateral lower extremity venous reflux due  to patient body habitus.    ASSESSMENT/PLAN:: 73 y.o. female presenting with bilateral lower extremity lymphedema, stage III there appears to be some amount of lipedema as well.  On physical exam, she had palpable pulse in the foot, with chronic skin changes.  Lymphedema stage III. While able to stand, she had contracture of the knees roughly 25 degrees.  I had a long discussion with Liberti regarding the above.  Unfortunately, lymphedema has very few treatment options.  The goal of treatment is to prevent progression.  She is at high risk due to sitting in the dependent position in her wheelchair for the majority of the day.  We discussed the importance of elevation, as well as compression.  I think she would be best served with a referral to the lymphedema clinic in [facility] for evaluation for lymphedema wraps.  If she is unable to tolerate, or places on, she may be a candidate for lymphedema pumps which she could wear at home.  I will forward this note to her primary, Dr. Verlene Glimpse to assist with the referral.  Unfortunately, I have no interventions to offer.  She  can follow-up with me as needed at this time.   Kayla Part, MD [facility] [phone] Total time of patient care including pre-visit research, consultation, and documentation greater than 30 minutes

## 2023-08-27 DIAGNOSIS — Z79899 Other long term (current) drug therapy: Secondary | ICD-10-CM | POA: Diagnosis not present

## 2023-09-13 ENCOUNTER — Encounter: Admitting: Vascular Surgery

## 2023-09-16 NOTE — Progress Notes (Deleted)
 Cardiology Office Note:    Date:  09/19/2023   ID:  Sharon Vasquez, DOB Sep 19, 1950, [name] [id number]  PCP:  Vicente Graham, DO  Cardiologist:  Oneil Bigness, MD { Click to update primary MD,subspecialty MD or APP then REFRESH:1}    Referring MD: Vicente Graham, DO   Chief Complaint: follow-up of CHF  History of Present Illness:    Sharon Vasquez is a 73 y.o. female with a history of mild non-obstructive CAD on cardiac catheterization in 10/2022, non-ischemic cardiomyopathy/ chronic HFrEF with EF as low as <20% in 10/2022 but normalized to 55-60% on last Echo in 03/2023, permanent atrial fibrillation on Xarelto , PVD s/p prior stenting to left common iliac and external iliac vein in [place],  chronic lower extremity edema secondary to chronic venous insufficiency and lymphedema, CVA in 12/2021, DVT/ PE in 2016 in [place], COPD, sleep apnea, hypertension, hyperlipidemia, type 2 diabetes mellitus, GERD, chronic back pain, and anxiety/ depression who is followed by Dr. Rolm Clos and presents today for routine follow-up.   Patient by cardiology primarily for CHF and permanent atrial fibrillation.  She previously had diastolic CHF.  However, she was admitted in 10/2022 for acute on chronic hypoxic and hypercarbic respiratory failure requiring intubation and was found to have a newly reduced EF during that admission. Echo showed LVEF of <20 percent with global hypokinesis, normal RV function, moderate moderately dilated left atrium, no significant valvular disease, and mild dilatation of the ascending aorta measuring 41 mm.  Hospitalization was complicated by PEA arrest in the setting of mucus plug in her ET tube. R/ LHC showed mild nonobstructive CAD with only 35% stenosis of proximal LAD. Hemodynamic findings were consistent with mild pulmonary hypertension. Medical therapy was recommended. She was started on GDMT and EF improved. Last Echo in 03/2023 showed LVEF of 55-60% with normal wall motion and mild LVH.  Cardiomyopathy was suspected to likely have been stress-induced.  She was last seen by Dr. Rolm Clos in 06/2023 at which time she reported some fluid retention and swelling which was attributed to high salt intake from eating out in restaurants. Chronic lower extremity edema largely due to chronic venous insufficiency and lymphedema. She was previously not felt to be a good candidate for laser ablation given severe obesity. She was continued on current dose of Torsemide  and referred to lymphedema clinic in [place].   She was seen by Dr. Rosalva Comber (Vascular Surgery) in 08/2023 and felt to have stage III bilateral lower extremity lymphedema as well as some lipedema. He recommended referral to the lymphedema clinic in [place] for evaluation of lymphedema wraps.  Patient presents today for follow-up. ***  Non-Obstructive CAD LHC in 10/2022 showed mild non-obstructive disease with only 35% stenosis of proximal LAD. - No chest pain.  - No Aspirin  given need for full anticoagulation.  - Continue statin.  Chronic HFrEF with Recovered EF Non-Ischemic Cardiomyopathy (Takotsubo Cardiomyopathy) EF <20% during hospitalization in 10/2022 for acute on chronic hypoxic and hypercarbic respiratory failure. R/ LHC at that time showed only mild CAD and findings consistent with mild pulmonary hypertension.  Medical therapy was recommended and EF improved with GDMT.  Last Echo in 03/2023 showed  LVEF of 55-60% with normal wall motion and mild LVH. Cardiomyopathy was suspected to likely have been stress-induced. - She has chronic lower extremity edema but otherwise euvolemic on exam. *** - Continue Torsemide  60mg  daily. - Continue Losartan  25mg  daily.  - Continue Toprol -XL 25mg  daily. - Continue Spironolactone  25mg  daily. -  Continue Jardiance  10mg  daily. - Discussed importance of daily weights and sodium/ fluid restrictions.  Permanent Atrial Fibrillation Rate controlled. - Continue Toprol -XL 25mg  daily. - Continue  Xarelto  20mg  daily.   PVD Lymhedema S/p prior stenting to left common iliac and external iliac vein in [place]. ABIs in 11/2020 were indicative of mild lower extremity arterial disease bilaterally. Venous dopplers at that time also showed evidence ov diffuse reflux of the left greater saphenus vein and common femoral vein. She was seen by Vascular Surgery at that time and venous issues were felt to be the bigger problem. However, she was not felt to be a good candidate for laser ablation due to severe obesity. She was recently seen by Dr. Rosalva Comber in 08/2023 and felt to have stage III lymphedema as well as some degree of lipedema. He recommended referral to lymphedema clinic in [facility] for evaluation of lymphedema wraps.  *** - Chronic lower extremity edema stable. *** - She is not active enough to illicit claudication. *** - Continue Torsemide  as above. - Continue compression stockings and limiting sodium intake.  Hypertension BP well controlled. *** - Continue medications for CHF as above.   Hyperlipidemia Lipid panel in ***: - Continue Lipitor  80mg  daily.   Type 2 Diabetes Mellitus Hemoglobin A1c ***. - On Jardiance  and Wegovy . - Management per PCP.  Obstructive Sleep Apnea ***  Obesity ***   EKGs/Labs/Other Studies Reviewed:    The following studies were reviewed:  ABIs/ TBIs 11/04/2020: Summary: Right: Resting right ankle-brachial index indicates mild right lower  extremity arterial disease. The right toe-brachial index is abnormal.   Left: Resting left ankle-brachial index indicates mild left lower  extremity arterial disease. The left toe-brachial index is abnormal.  _______________  Venous Dopplers 11/04/2020: Summary:  Left:  - Venous reflux is noted in the left common femoral vein.  - Venous reflux is noted in the left sapheno-femoral junction.  - Venous reflux is noted in the left greater saphenous vein in the thigh.  - No evidence of deep or superficial thrombosis  in the visualized veins of  the left lower extremity.  _______________  Echocardiogram 10/29/2022: Impressions: 1. LV septum is aneurysmal towards the apex. Left ventricular ejection  fraction, by estimation, is <20%. The left ventricle has severely  decreased function. The left ventricle demonstrates global hypokinesis.  There is mild left ventricular hypertrophy.  Left ventricular diastolic parameters are indeterminate.   2. Right ventricular systolic function is normal. The right ventricular  size is normal. There is normal pulmonary artery systolic pressure.   3. Left atrial size was moderately dilated.   4. The mitral valve is normal in structure. No evidence of mitral valve  regurgitation.   5. The aortic valve is tricuspid. Aortic valve regurgitation is not  visualized.   6. There is mild dilatation of the ascending aorta, measuring 41 mm.   7. The inferior vena cava is dilated in size with <50% respiratory  variability, suggesting right atrial pressure of 15 mmHg.  _______________  Right/ Left Cardiac Catheterization 11/01/2022:   Prox LAD lesion is 35% stenosed.   LV end diastolic pressure is normal.   Hemodynamic findings consistent with mild pulmonary hypertension.   Mild nonobstructive CAD Normal LV filling pressures. PCWP 19/22 with mean 16 mm Hg. LVEDP 14 mm Hg Mild pulmonary HTN PAP 40/20 with mean 28 mm Hg Normal RA pressure 9 mm Hg Normal Cardiac output 8.72 L/min, index 3.21   Plan: medical management  Diagnostic Dominance: Left  _______________  Echocardiogram 03/07/2023: Impressions: 1. Left ventricular ejection fraction, by estimation, is 55 to 60%. The  left ventricle has normal function. The left ventricle has no regional  wall motion abnormalities. The left ventricular internal cavity size was  moderately dilated. There is mild  left ventricular hypertrophy. Left ventricular diastolic parameters are  indeterminate.   2. Right ventricular systolic  function was not well visualized. The right  ventricular size is not well visualized. Tricuspid regurgitation signal is  inadequate for assessing PA pressure.   3. The mitral valve is normal in structure. No evidence of mitral valve  regurgitation.   4. The aortic valve is tricuspid. Aortic valve regurgitation is not  visualized. Aortic valve sclerosis is present, with no evidence of aortic  valve stenosis.   5. The inferior vena cava is normal in size with greater than 50%  respiratory variability, suggesting right atrial pressure of 3 mmHg.    EKG:  EKG not ordered today.  Recent Labs: 12/01/2022: Magnesium  2.0 12/26/2022: ALT 15 02/01/2023: B Natriuretic Peptide 65.2 02/07/2023: Hemoglobin 13.8; Platelets 429 05/18/2023: BUN 17; Creatinine, Ser 0.94; Potassium 4.3; Sodium 140  Recent Lipid Panel    Component Value Date/Time   CHOL 91 (L) 08/25/2021 1006   TRIG 170 (H) 10/29/2022 0423   HDL 26 (L) 08/25/2021 1006   CHOLHDL 3.5 08/25/2021 1006   LDLCALC 46 08/25/2021 1006    Physical Exam:    Vital Signs: There were no vitals taken for this visit.    Wt Readings from Last 3 Encounters:  08/23/23 (!) 342 lb (155.1 kg)  06/28/23 (!) 342 lb (155.1 kg)  06/20/23 (!) 342 lb (155.1 kg)     General: 73 y.o. female in no acute distress. HEENT: Normocephalic and atraumatic. Sclera clear.  Neck: Supple. No carotid bruits. No JVD. Heart: *** RRR. Distinct S1 and S2. No murmurs, gallops, or rubs.  Lungs: No increased work of breathing. Clear to ausculation bilaterally. No wheezes, rhonchi, or rales.  Abdomen: Soft, non-distended, and non-tender to palpation.  Extremities: No lower extremity edema.  Radial and distal pedal pulses 2+ and equal bilaterally. Skin: Warm and dry. Neuro: No focal deficits. Psych: Normal affect. Responds appropriately.   Assessment:    No diagnosis found.  Plan:     Disposition: Follow up in ***   Signed, Vishnu Moeller E Synda Bagent, PA-C  09/19/2023  8:12 AM    [facility]

## 2023-09-17 DIAGNOSIS — H2513 Age-related nuclear cataract, bilateral: Secondary | ICD-10-CM | POA: Diagnosis not present

## 2023-09-20 ENCOUNTER — Ambulatory Visit: Attending: Student | Admitting: Student

## 2023-09-20 ENCOUNTER — Telehealth

## 2023-09-21 DIAGNOSIS — M25562 Pain in left knee: Secondary | ICD-10-CM | POA: Diagnosis not present

## 2023-09-21 DIAGNOSIS — G8929 Other chronic pain: Secondary | ICD-10-CM | POA: Diagnosis not present

## 2023-09-21 DIAGNOSIS — Z79899 Other long term (current) drug therapy: Secondary | ICD-10-CM | POA: Diagnosis not present

## 2023-09-21 DIAGNOSIS — M25561 Pain in right knee: Secondary | ICD-10-CM | POA: Diagnosis not present

## 2023-09-21 DIAGNOSIS — R030 Elevated blood-pressure reading, without diagnosis of hypertension: Secondary | ICD-10-CM | POA: Diagnosis not present

## 2023-09-25 ENCOUNTER — Other Ambulatory Visit: Payer: Self-pay

## 2023-09-25 DIAGNOSIS — Z79899 Other long term (current) drug therapy: Secondary | ICD-10-CM | POA: Diagnosis not present

## 2023-09-25 NOTE — Patient Outreach (Signed)
 Complex Care Management   Visit Note  09/25/2023  Name:  Sharon Vasquez [name]: [id number] DOB: [birth year]  Situation: Referral received for Complex Care Management related to Heart Failure I obtained verbal consent from Patient.  Visit completed with patient  on the phone  Background:   Past Medical History:  Diagnosis Date   A-fib (HCC)    Acute cystitis without hematuria 11/02/2022   Acute hypoxemic respiratory failure (HCC) 02/20/2022   Acute on chronic HFrEF (heart failure with reduced ejection fraction) (HCC) 10/28/2022   Acute on chronic hypoxic respiratory failure (HCC) 01/04/2022   Acute upper respiratory infection 01/08/2022   Anxiety    Arthritis    qwhre; joints, back (04/17/2017)   Atrial fibrillation (HCC)    Benign breast cyst in female, left 01/08/2017   Found by Screening mammogram, evaluated by U/S on 01/08/17 and determined to be a benign simple breast cyst.   Breast cancer (HCC)    Cellulitis of left lower leg 05/30/2017   CHF (congestive heart failure) (HCC)    Chronic low back pain 08/21/2016   Chronic lower back pain    Chronic venous insufficiency    /notes 05/30/2017   COPD (chronic obstructive pulmonary disease) (HCC)    Depression    Diabetes (HCC) 01/08/2022   Diabetes mellitus without complication (HCC)    DVT (deep venous thrombosis) (HCC) 11/16/2016   Dysrhythmia    Esophageal dysmotility 11/10/2019   Previous workup by ENT with fiberoptic leryngoscopy which was normal. Dx with laryngeal pharyngeal reflux.   Foul smelling urine 05/04/2022   GERD (gastroesophageal reflux disease)    Headache    weekly for the last 3 months (04/17/2017)   History of hypokalemia 11/17/2022   History of pulmonary embolism    Pulmonary embolism   Hyperlipidemia    Hypertension    Hypokalemia 12/01/2022   Laryngopharyngeal reflux 12/18/2015   Morbid obesity (HCC)    PE (pulmonary embolism)    Pulmonary embolism (HCC) 09/21/2014   Right foot pain 03/03/2021    Sleep apnea    Stroke (HCC) 12/02/2021   Vocal cord polyp 12/18/2015    Assessment: Patient Reported Symptoms:  Cognitive Cognitive Status: Able to follow simple commands, Alert and oriented to person, place, and time, Normal speech and language skills, Insightful and able to interpret abstract concepts      Neurological Neurological Review of Symptoms: No symptoms reported    HEENT HEENT Symptoms Reported: No symptoms reported      Cardiovascular Cardiovascular Symptoms Reported: Swelling in legs or feet Does patient have uncontrolled Hypertension?: No Cardiovascular Conditions: Heart failure, High blood cholesterol, Dysrhythmia Cardiovascular Management Strategies: Medication therapy  Respiratory Respiratory Symptoms Reported: Shortness of breath Respiratory Conditions: COPD  Endocrine Is patient diabetic?: Yes Is patient checking blood sugars at home?: No Endocrine Conditions: Diabetes Endocrine Management Strategies: Medication therapy  Gastrointestinal Gastrointestinal Symptoms Reported: Constipation Gastrointestinal Conditions: Constipation Gastrointestinal Management Strategies: Medication therapy    Genitourinary Genitourinary Symptoms Reported: Incontinence Genitourinary Conditions: Incontinence Genitourinary Management Strategies: Incontinence garment/pad, Medication therapy  Integumentary Integumentary Symptoms Reported: Other Other Integumentary Symptoms: Dry skin Skin Conditions: Other Other Skin Conditions: uses lotions  Musculoskeletal Musculoskelatal Symptoms Reviewed: Difficulty walking, Weakness, Unsteady gait Musculoskeletal Conditions: Back pain Musculoskeletal Management Strategies: Medical device, Medication therapy Falls in the past year?: No    Psychosocial       Quality of Family Relationships: involved, supportive Do you feel physically threatened by others?: No      09/25/2023   12:01 PM  Depression screen PHQ 2/9  Decreased Interest 0   Down, Depressed, Hopeless 0  PHQ - 2 Score 0    There were no vitals filed for this visit.  Medications Reviewed Today     Reviewed by Weyman Corning, RN (Registered Nurse) on 09/25/23 at 1147  Med List Status: <None>   Medication Order Taking? Sig Documenting Provider Last Dose Status Informant  albuterol  (VENTOLIN  HFA) 108 (90 Base) MCG/ACT inhaler [id number] No Inhale 2 puffs into the lungs every 4 (four) hours as needed for shortness of breath. Atway, Rayann N, DO Taking Active Pharmacy Records, Self           Med Note DANNIS, CORNING CINDERELLA Schaumann Jan 18, 2023 11:55 AM) Patient has it if she needs it  alendronate  (FOSAMAX ) 70 MG tablet [id number] No Take 70 mg by mouth once a week. Take with a full glass of water on an empty stomach, Patient hasn't started medication. [provider] Taking Active Self, Pharmacy Records  atorvastatin  (LIPITOR ) 80 MG tablet [id number] No Take 1 tablet (80 mg total) by mouth daily. Masters, Psychiatric nurse, DO Taking Active   empagliflozin  (JARDIANCE ) 10 MG TABS tablet [id number] No TAKE 1 TABLET(10 MG) BY MOUTH DAILY BEFORE BREAKFAST O'Neal, Darryle Ned, MD Taking Active   esomeprazole  (NEXIUM ) 40 MG capsule [id number] No Take 1 capsule (40 mg total) by mouth daily as needed (for heartburn or indigestion). Parrett, Madelin RAMAN, NP Taking Active   Fluticasone -Umeclidin-Vilant (TRELEGY ELLIPTA ) 100-62.5-25 MCG/ACT AEPB [id number] No Inhale 1 puff into the lungs daily. Nooruddin, Saad, MD Taking Active Self, Pharmacy Records  gabapentin  (NEURONTIN ) 400 MG capsule [id number] No Take 1 capsule (400 mg total) by mouth 2 (two) times daily. Masters, Izetta, DO Taking Active   letrozole  (FEMARA ) 2.5 MG tablet [id number] No Take 1 tablet (2.5 mg total) by mouth daily. TAKE 1 TABLET(2.5 MG) BY MOUTH DAILY Gudena, Vinay, MD Taking Active   LORazepam  (ATIVAN ) 0.5 MG tablet [id number] No Take 0.5 mg by mouth daily as needed for anxiety. [provider] Taking Active Pharmacy Records,  Self  losartan  (COZAAR ) 25 MG tablet [id number] No Take 1 tablet (25 mg total) by mouth daily. Celestina Czar, MD Taking Active   metoprolol  succinate (TOPROL -XL) 25 MG 24 hr tablet [id number] No Take 1 tablet (25 mg total) by mouth daily. Atway, Rayann N, DO Taking Active   naloxone (NARCAN) nasal spray 4 mg/0.1 mL [id number] No Place 1 spray into the nose once as needed (for overdose suspected). for opioid overdose [provider] Taking Active Pharmacy Records, Self  oxybutynin  (DITROPAN ) 5 MG tablet [id number] No TAKE 1 TABLET BY MOUTH THREE TIMES DAILY Atway, Rayann N, DO Taking Active   oxyCODONE  (ROXICODONE ) 15 MG immediate release tablet [id number] No Take 15 mg by mouth See admin instructions. Take 15mg  (1 tablet) by mouth every morning and 15mg  (1 tablet) at bedtime in addition to twice during the day as needed. [provider] Taking Active Pharmacy Records, Self  OXYGEN  [id number] No Inhale 2-3 L into the lungs daily. [provider] Taking Active Pharmacy Records, Self  potassium chloride  SA (KLOR-CON  M) 20 MEQ tablet [id number] No Take 2 tablets (40 mEq total) by mouth 2 (two) times daily. Colletta Manuelita Garre, [place] Taking Active   PRESCRIPTION MEDICATION [id number] No Inhale into the lungs at bedtime. cpap [provider] Taking Active Pharmacy Records, Self  rivaroxaban  (XARELTO ) 20 MG TABS tablet [id number] No Take 1 tablet (20 mg total) by mouth every morning.  Jinwala, Sagar H, MD Taking Active Pharmacy Records, Self  spironolactone  (ALDACTONE ) 25 MG tablet [id number] No Take 1 tablet (25 mg total) by mouth daily. Colletta Manuelita Garre, PA-C Taking Expired 06/29/23 2359   Torsemide  60 MG TABS [id number] No Take 60 mg by mouth daily. Barbaraann Darryle Ned, MD Taking Active   WEGOVY  2.4 MG/0.75ML SOAJ [id number] No ADMINISTER 2.4 MG UNDER THE SKIN 1 TIME A WEEK Atway, Rayann N, DO Taking Active             Recommendation:   Acute PCP follow-up  Pulmonology 6/30 830 am  [facility] 7/2 815 am .  Follow Up Plan:   Telephone follow up appointment date/time:  10/25/23  1130 am  Wilbert Diver RN, BSN, [facility] [place]  [facility], [facility]    Care Coordinator Phone: [phone]

## 2023-09-25 NOTE — Patient Instructions (Signed)
 Visit Information  Thank you for taking time to visit with me today. Please don't hesitate to contact me if I can be of assistance to you before our next scheduled appointment.  Your next care management appointment is by telephone on 10/25/23 at 1130 am   Please call the care guide team at [phone] if you need to cancel, schedule, or reschedule an appointment.   Please call [phone]-TALK (toll free, 24 hour hotline) call 911 if you are experiencing a Mental Health or Behavioral Health Crisis or need someone to talk to.  Wilbert Diver RN, BSN, [facility]  [facility], [facility]    Care Coordinator Phone: [phone]

## 2023-09-30 IMAGING — DX DG KNEE COMPLETE 4+V*R*
4 series · 4 of 4 positions shown · non-contrast
Comparison: None Available.

CLINICAL DATA: Primary osteoarthritis of both knees.

EXAM:
RIGHT KNEE - COMPLETE 4+ VIEW

[knee obl (1 of 3)]
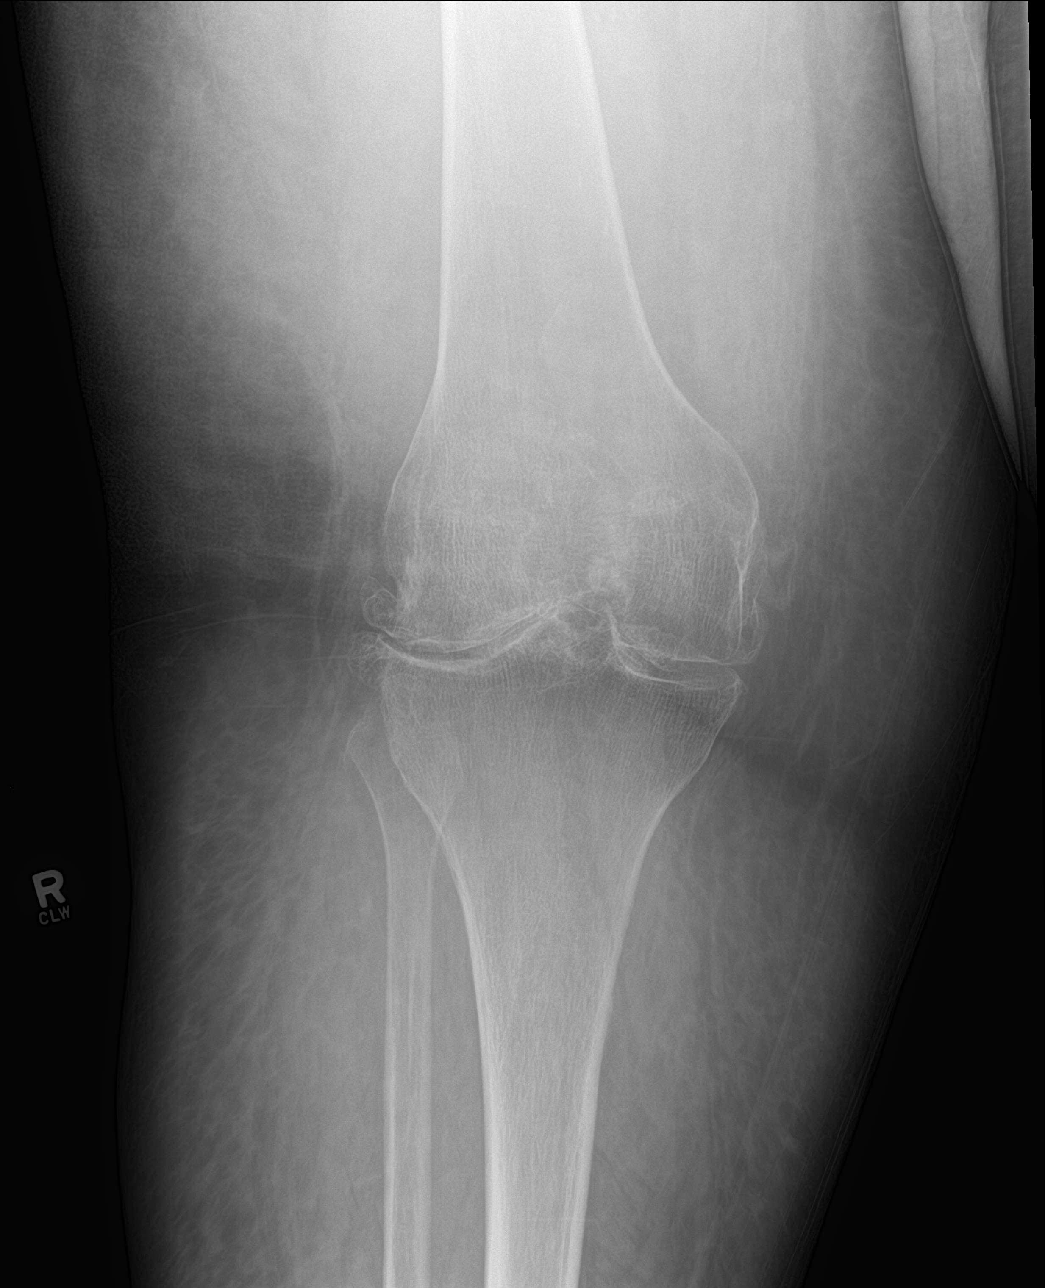

[knee obl (2 of 3)]
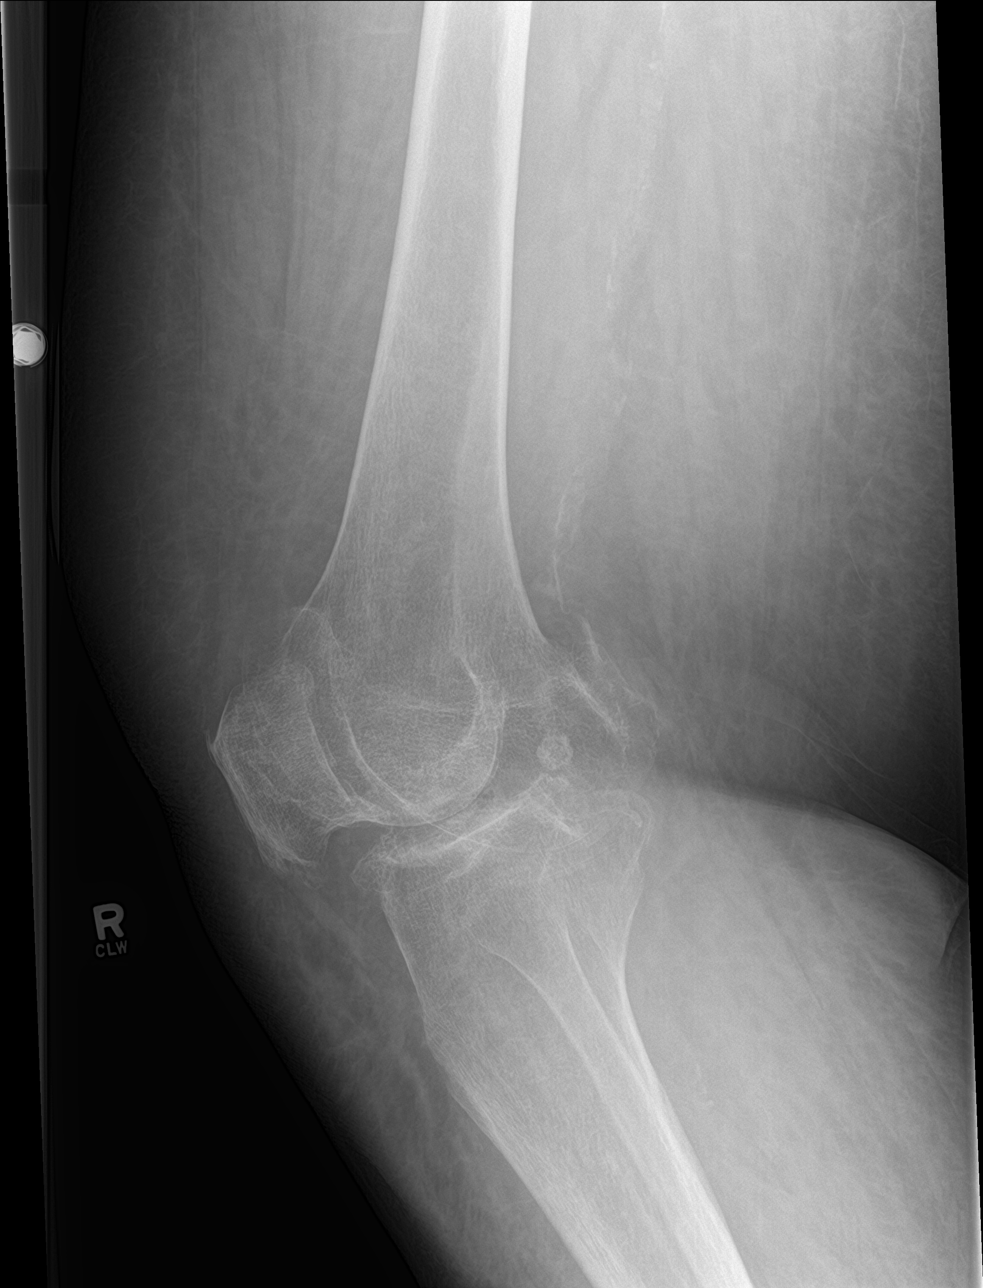

[knee obl (3 of 3)]
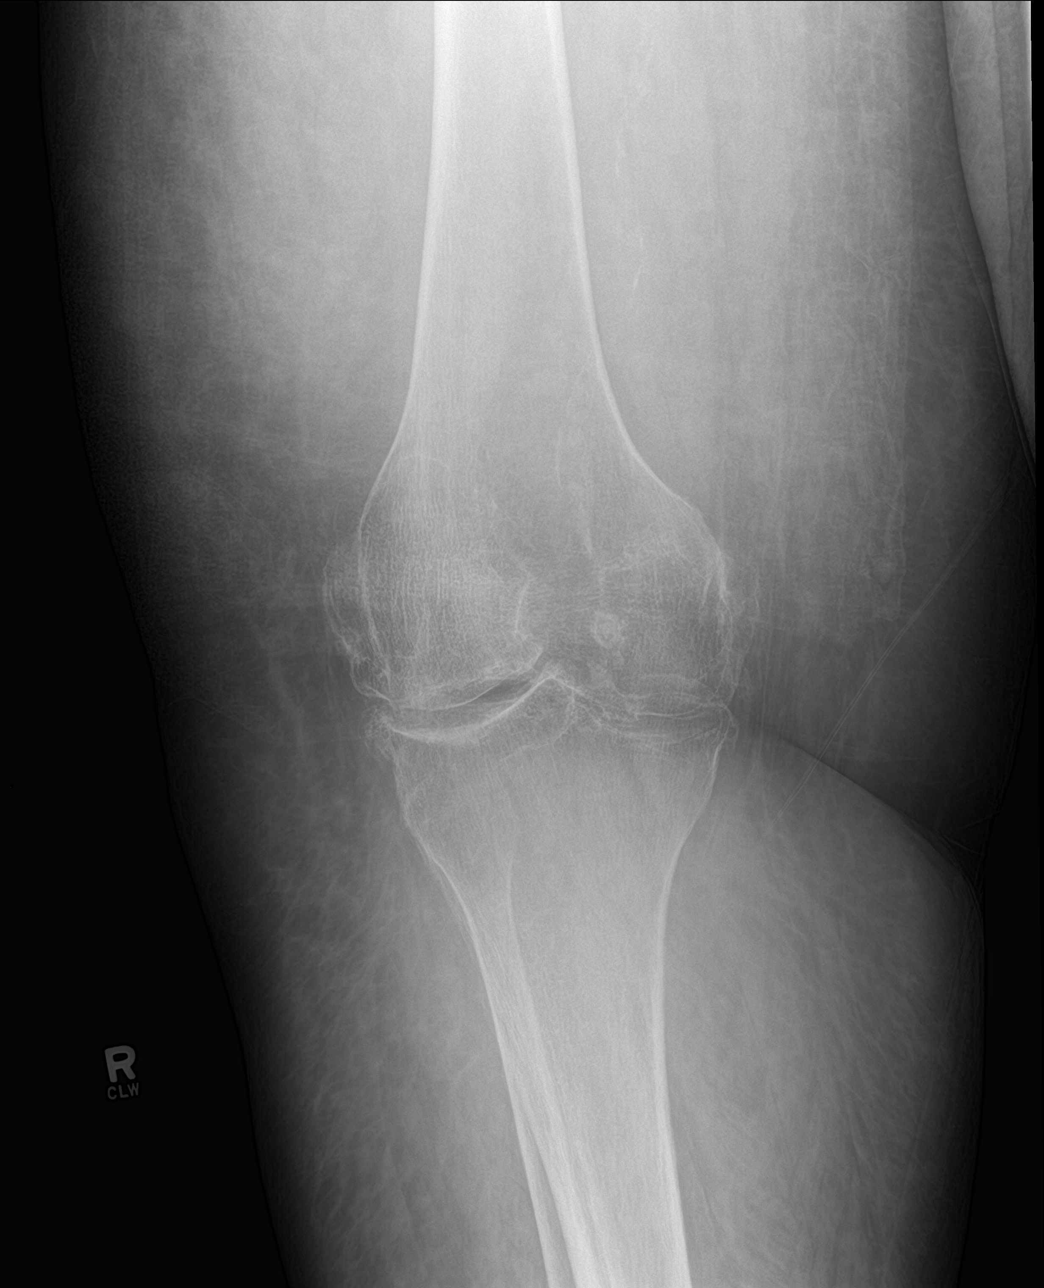

[knee ap]
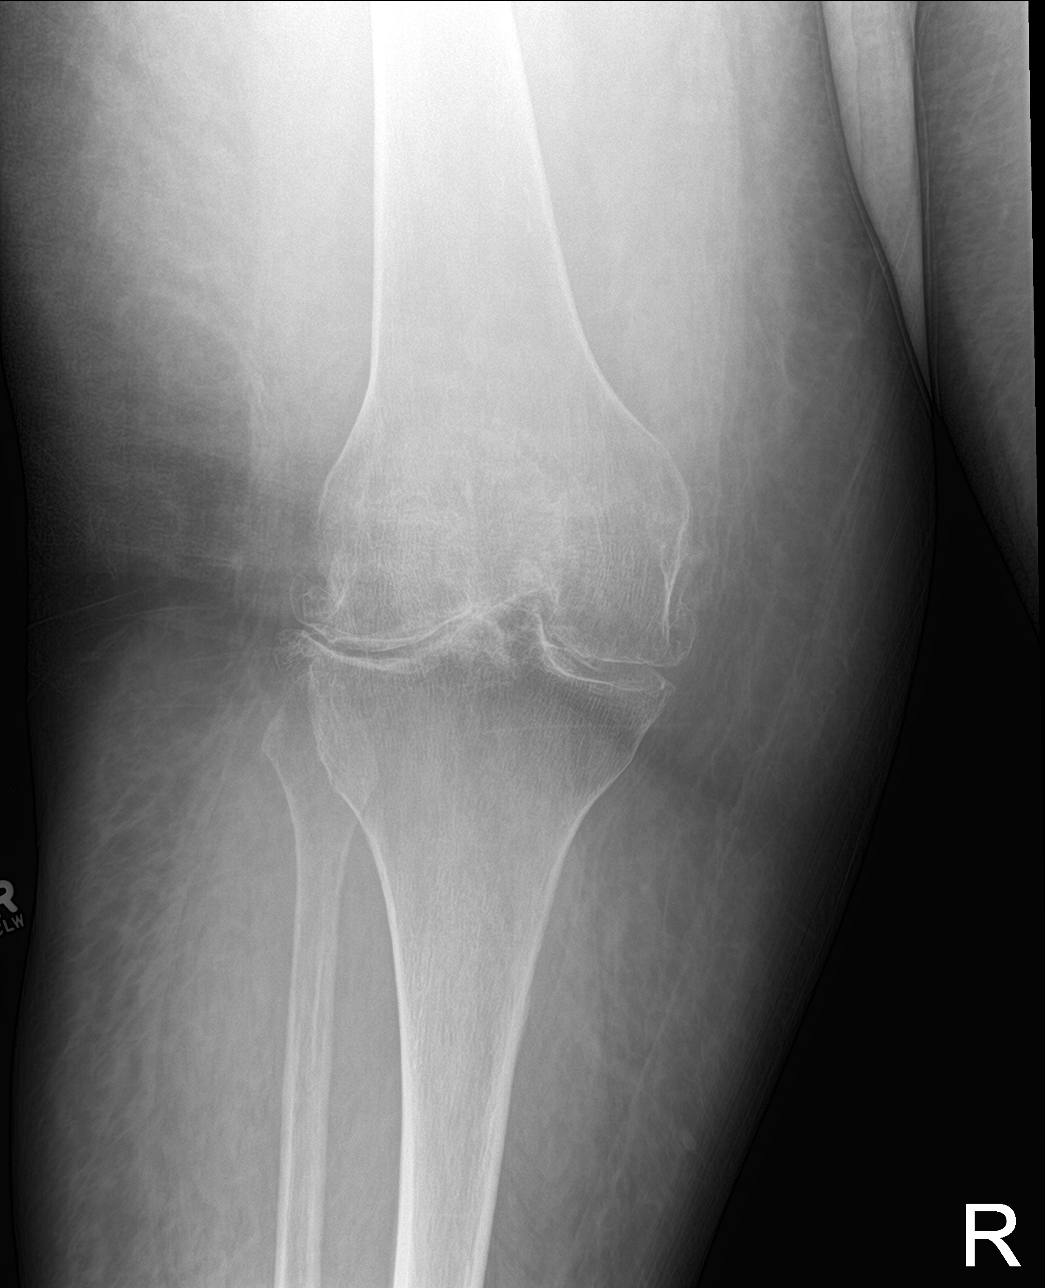

[4 of 4 positions shown; findings below may reference images not displayed]

FINDINGS: Severe lateral greater than medial compartment joint space narrowing
and peripheral osteophytosis. Severe patellofemoral joint space
narrowing and peripheral osteophytosis. Mild chronic enthesopathic
change at the patellar greater than quadriceps insertions on the
patella. Within the limitations of diffuse decreased bone
mineralization no definite acute fracture line is seen. No
dislocation. No joint effusion. Moderate vascular calcifications.
IMPRESSION: Severe tricompartmental osteoarthritis, greatest within the lateral
and patellofemoral compartments.

## 2023-09-30 IMAGING — DX DG KNEE COMPLETE 4+V*L*
4 series · 4 of 4 positions shown · non-contrast
Comparison: Left tibia and fibula radiographs 05/29/2017, left knee
radiographs 05/24/2017

CLINICAL DATA: Osteoarthritis.

EXAM:
LEFT KNEE - COMPLETE 4+ VIEW

[knee ap]
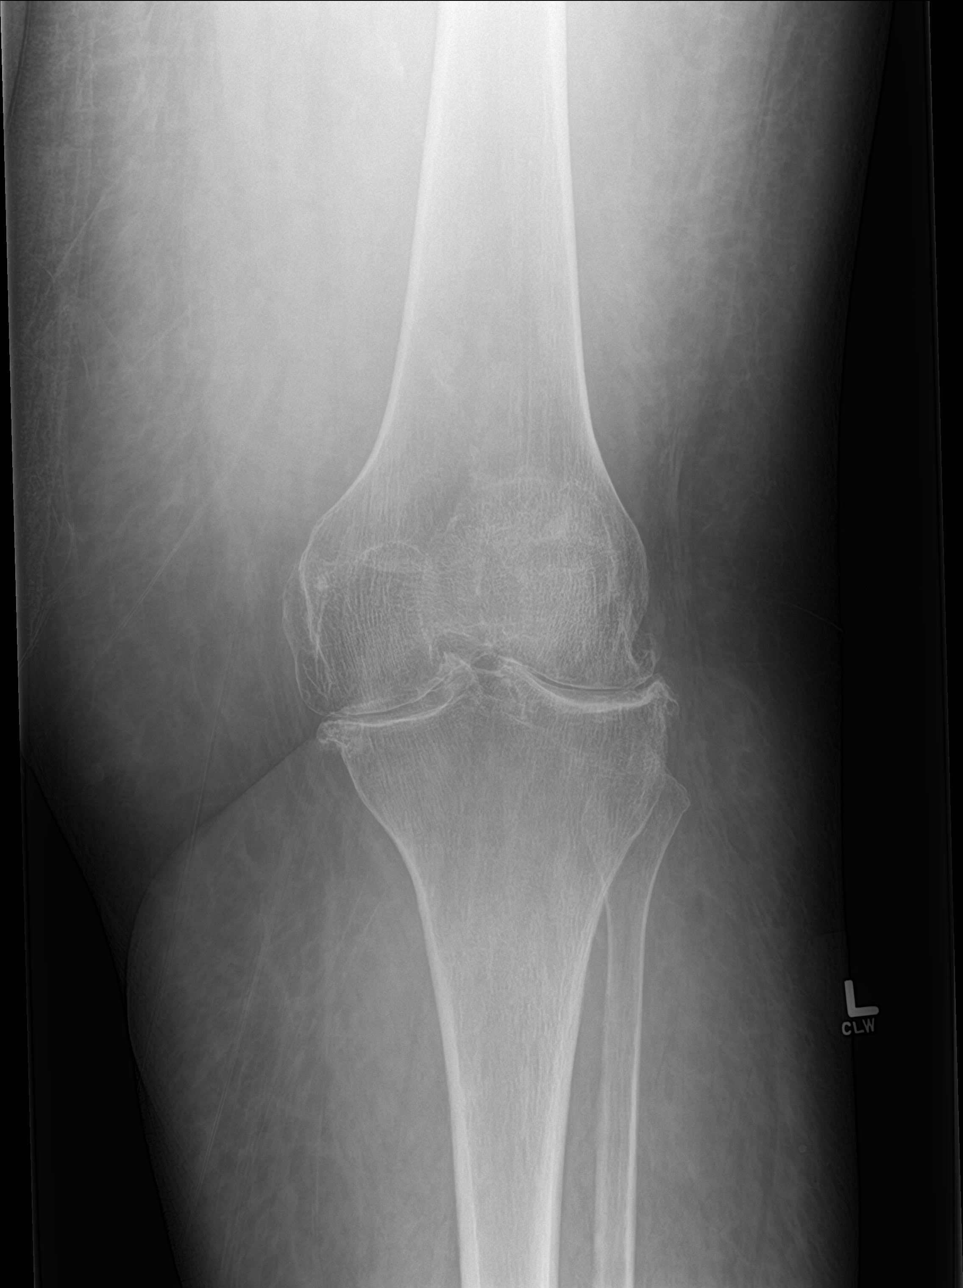

[knee lat]
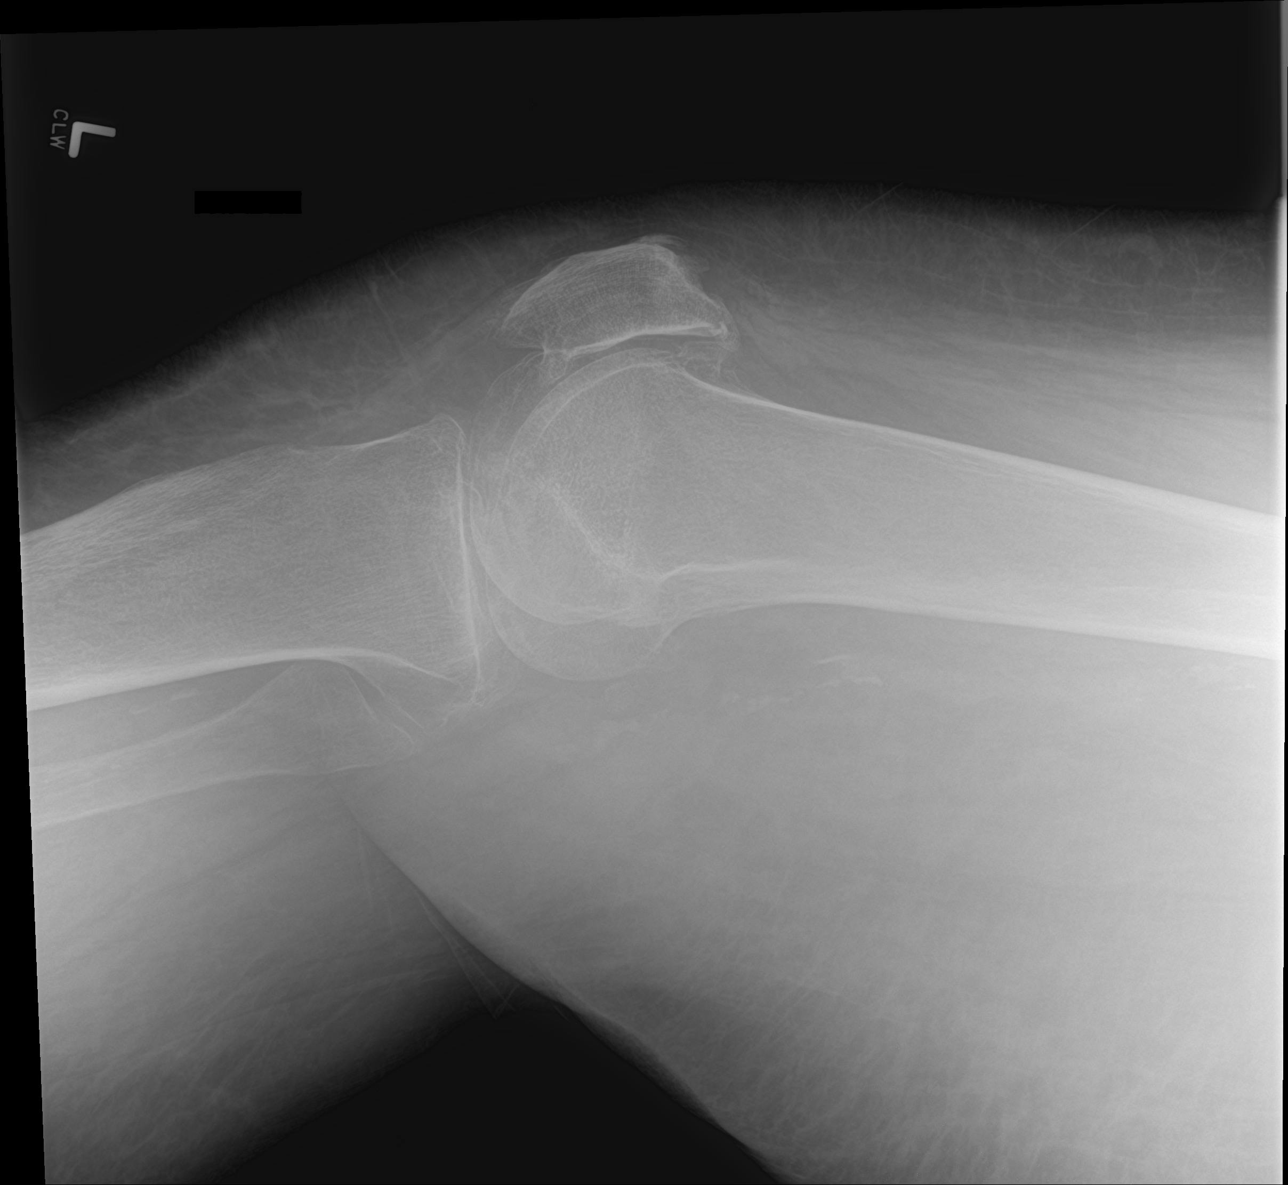

[knee obl (1 of 2)]
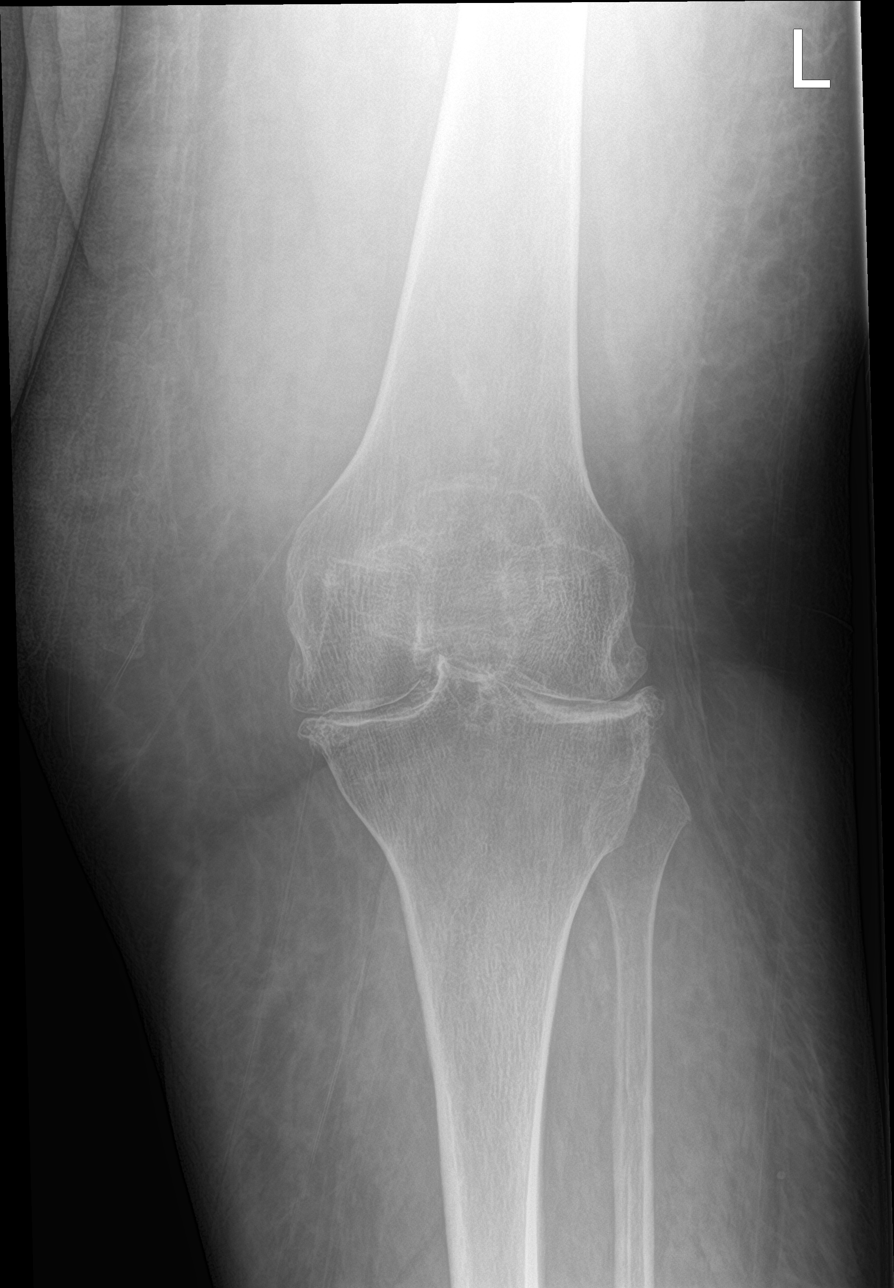

[knee obl (2 of 2)]
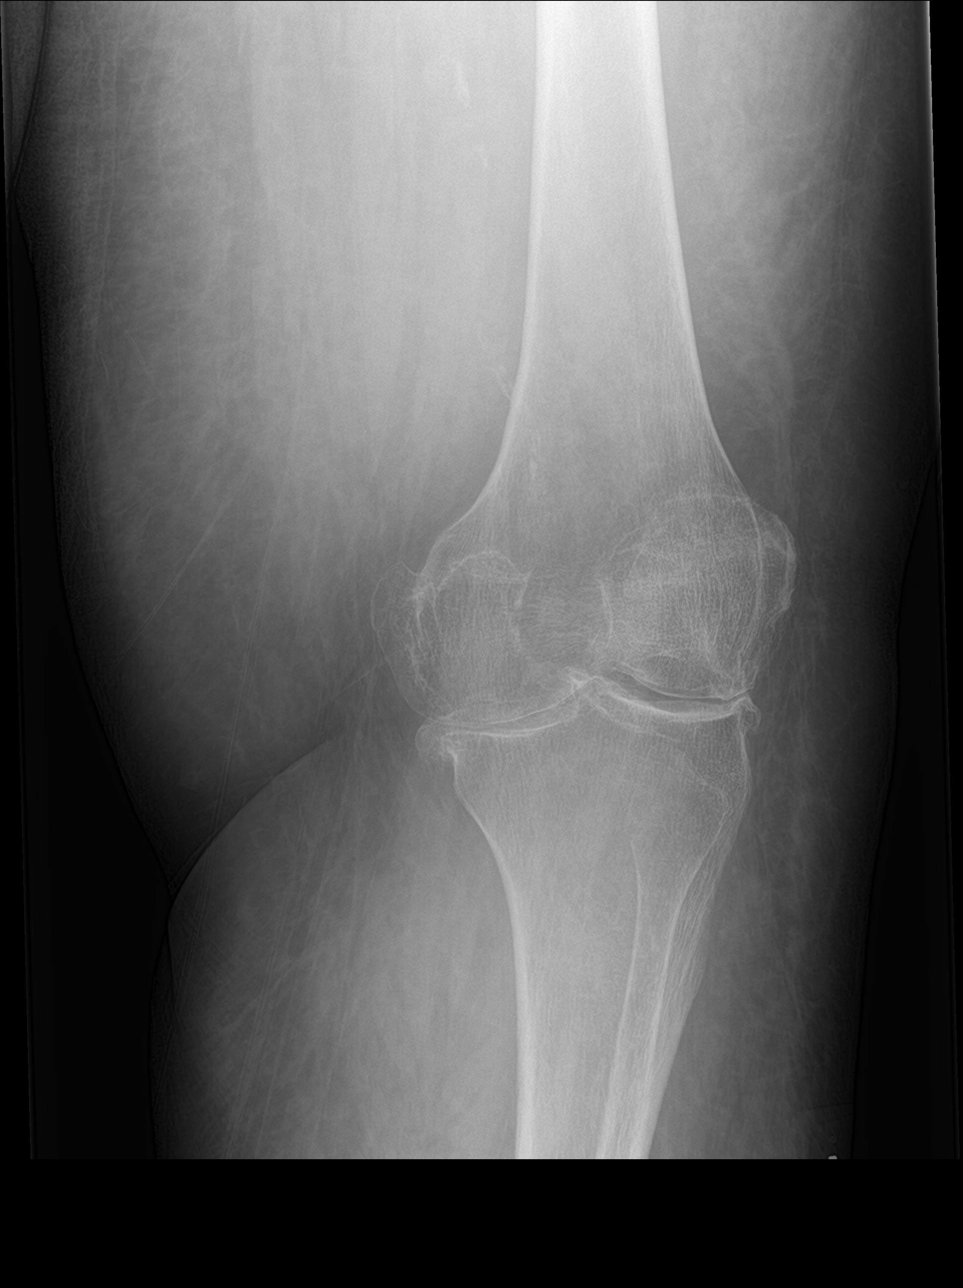

[4 of 4 positions shown; findings below may reference images not displayed]

FINDINGS: Diffuse decreased bone mineralization. Severe patellofemoral joint
space narrowing and peripheral osteophytosis. Mild chronic
enthesopathic change at the quadriceps insertion on the patella.

Severe medial compartment and moderate to severe lateral compartment
joint space narrowing and peripheral osteophytosis. No joint
effusion. Mild vascular calcifications.
IMPRESSION: Severe patellofemoral and medial compartment greater than lateral
compartment osteoarthritis. No significant change from prior.

## 2023-10-01 ENCOUNTER — Encounter (HOSPITAL_BASED_OUTPATIENT_CLINIC_OR_DEPARTMENT_OTHER): Payer: Self-pay | Admitting: Pulmonary Disease

## 2023-10-01 ENCOUNTER — Ambulatory Visit (HOSPITAL_BASED_OUTPATIENT_CLINIC_OR_DEPARTMENT_OTHER): Admitting: Pulmonary Disease

## 2023-10-01 VITALS — BP 135/69 | HR 63 | Ht 74.0 in | Wt 344.2 lb

## 2023-10-01 DIAGNOSIS — G4733 Obstructive sleep apnea (adult) (pediatric): Secondary | ICD-10-CM

## 2023-10-01 DIAGNOSIS — J849 Interstitial pulmonary disease, unspecified: Secondary | ICD-10-CM | POA: Diagnosis not present

## 2023-10-01 DIAGNOSIS — E119 Type 2 diabetes mellitus without complications: Secondary | ICD-10-CM

## 2023-10-01 DIAGNOSIS — Z87891 Personal history of nicotine dependence: Secondary | ICD-10-CM | POA: Diagnosis not present

## 2023-10-01 DIAGNOSIS — J432 Centrilobular emphysema: Secondary | ICD-10-CM | POA: Diagnosis not present

## 2023-10-01 MED ORDER — TRELEGY ELLIPTA 100-62.5-25 MCG/ACT IN AEPB: 1.0000 | INHALATION_SPRAY | Freq: Every day | RESPIRATORY_TRACT | 5 refills | Status: AC

## 2023-10-01 NOTE — Progress Notes (Signed)
 Subjective:    Patient ID: Sharon Vasquez, female    DOB: 1951/02/02, 73 y.o.   [name]: [id number]  HPI  73 yo morbidly obese smoker for follow-up of RB -ILD and OSA ,chr resp failure on 2L O2 during sleep She smoked up to 2 packs/day,  now decreased to about 4-5 cigarettes daily HRCT suggest alternative diagnosis- RB ILD versus hypersensitivity pneumonitis  -finally quit smoking October 2023.    PMH - chronic diastolic heart failure.   -pulmonary embolism/ LLE DVT 2015  on Xarelto   s/p venoplasty and stenting of left common iliac and external iliac vein Duplex 05/2017 did not show any residual stenosis. -on Xarelto  for chronic atrial fibrillation , amiodarone  stopped 10/2019  She resides in a senior complex and uses [employer] for transportation. She has lost approximately 40 pounds since stopping smoking and starting semaglutide  and Jardiance .   4 month FU visit  Her CPAP machine is non-functional, and she faces insurance and logistical barriers to obtaining a replacement. She underwent a sleep study earlier this year at [facility] but has not received the results. During the study, she did not use supplemental oxygen . She currently uses oxygen  at two liters per minute during sleep and reports adequate rest despite some bone rigidity.  She experiences occasional dyspnea, particularly during activities of daily living, though it is less severe than previously. An incident in the past year involved dyspnea when her chair got stuck on a bus, necessitating walking to a seat.  She takes Trelegy and torasemide 60 mg daily. She uses pain medication only as needed.  Reviewed LDCT & sleep study  Significant tests/ events reviewed     Serology 06/2021 ANA, CCP neg, ACE 30, HSP neg  LDCT chest 06/2023 asc AA 4.1 cm, unchanged RML nodule   HRCT 05/2021   alternative  mild progression compared to the prior study and extensive air trapping,  favor chronic hypersensitivity pneumonitis   HRCT 05/2020  >> probable RB ILD, no worsening, enlarged pulmonary artery 4.4 cm, 4.4 cm ascending aortic aneurysm   HRCT 10/2019 >> Mild patchy ground-glass opacity and minimal subpleural reticulation without bronchiectasis or honeycombing. Extensive air trapping >> RBILD, enlarged PA   HRCT 12/2017 >> Mild interlobular septal thickening and mild patchy peribronchovascular ground-glass opacity in both lungs. Minimal patchy subpleural reticulation in the upper lobes ? Pul edema vs RB-ILD   PFTs 03/2021 moderate restriction, ratio 89, FEV1 74%, FVC 65%, TLC 54%, DLCO 15.3/62%   PFTs 12/2017 - No obstruction, mild-mod restriction, decreased DLCO  FVC 2.16 (66%), FEV1 1.93 (75%), ratio 89, DLCOcor 16.45 (48%)   FENO 12/2017 5   Echo 10/2020 moderate LVH, RVSP 37  ECHO 07/2016 - EF 60-65%, PA peak pressure 29mm Hg   HST 12/2017 >> AHI 20/h  CPAP titration study 04/2023 >>optimal control at CPAP 16 cm H2O    Esophagram 01/2020 >> Smooth strictured narrowing involving the distal esophagus near the GE junction  Review of Systems Pt denies any significant  nasal congestion or excess secretions, fever, chills, sweats, unintended wt loss, pleuritic or exertional cp, orthopnea pnd or leg swelling.  Pt also denies any obvious fluctuation in symptoms with weather or environmental change or other alleviating or aggravating factors.    Pt denies any increase in rescue therapy over baseline, denies waking up needing it or having early am exacerbations or coughing/wheezing/ or dyspnea      Objective:   Physical Exam  Gen. Pleasant, obese, in no distress ENT -  no lesions, no post nasal drip Neck: No JVD, no thyromegaly, no carotid bruits Lungs: no use of accessory muscles, no dullness to percussion, decreased without rales or rhonchi  Cardiovascular: Rhythm regular, heart sounds  normal, no murmurs or gallops, 2+ peripheral edema Musculoskeletal: No deformities, no cyanosis or clubbing , no tremors       Assessment  & Plan:   Emphysema Emphysema secondary to smoking history. Recent imaging from March indicates well-managed condition with no significant changes. Reports occasional dyspnea during activities of daily living but no severe exacerbations in the past year. - Continue Trelegy inhaler  OSA on CPAP Sleep apnea with malfunctioning CPAP machine. Reports difficulty obtaining a new machine due to insurance and logistical issues. Recent sleep study conducted without supplemental oxygen , results unknown to her. - Review sleep study results - Assist in obtaining a new CPAP machine prescription - auto 10-16 cm  Peripheral edema Peripheral edema with fluid accumulation in the lower extremities. Reports weight loss but persistent fluid retention. No recent hospitalizations for fluid overload. - Continue torasemide 60 mg once daily - Refill torasemide prescription  Aortic aneurysm, unspecified Aortic aneurysm noted on imaging with slight enlargement. Requires monitoring for changes. - Monitor aortic aneurysm with annual imaging  RB-ILD  - monitor with annual CT Hopefully will decresae with smoking cessation  Chronic resp failre - on noct O2 Strictly, will not qualify based on titration study

## 2023-10-01 NOTE — Patient Instructions (Signed)
  X Refills on trelegy  X Rx for auto CPAP 10-16 cm to DME

## 2023-10-01 NOTE — Addendum Note (Signed)
 Addended by: Rhealyn Cullen L on: 10/01/2023 03:00 PM   Modules accepted: Orders

## 2023-10-03 ENCOUNTER — Other Ambulatory Visit: Payer: Self-pay | Admitting: Student

## 2023-10-03 ENCOUNTER — Ambulatory Visit: Payer: Self-pay | Admitting: Student

## 2023-10-03 VITALS — BP 135/88 | HR 70 | Temp 97.9°F | Ht 74.0 in | Wt 336.8 lb

## 2023-10-03 DIAGNOSIS — I872 Venous insufficiency (chronic) (peripheral): Secondary | ICD-10-CM | POA: Diagnosis not present

## 2023-10-03 DIAGNOSIS — I10 Essential (primary) hypertension: Secondary | ICD-10-CM

## 2023-10-03 DIAGNOSIS — I4891 Unspecified atrial fibrillation: Secondary | ICD-10-CM

## 2023-10-03 DIAGNOSIS — Z0000 Encounter for general adult medical examination without abnormal findings: Secondary | ICD-10-CM

## 2023-10-03 DIAGNOSIS — E119 Type 2 diabetes mellitus without complications: Secondary | ICD-10-CM

## 2023-10-03 DIAGNOSIS — Z7984 Long term (current) use of oral hypoglycemic drugs: Secondary | ICD-10-CM

## 2023-10-03 DIAGNOSIS — Z7985 Long-term (current) use of injectable non-insulin antidiabetic drugs: Secondary | ICD-10-CM

## 2023-10-03 DIAGNOSIS — I110 Hypertensive heart disease with heart failure: Secondary | ICD-10-CM | POA: Diagnosis not present

## 2023-10-03 DIAGNOSIS — I5032 Chronic diastolic (congestive) heart failure: Secondary | ICD-10-CM

## 2023-10-03 DIAGNOSIS — I5020 Unspecified systolic (congestive) heart failure: Secondary | ICD-10-CM

## 2023-10-03 LAB — POCT GLYCOSYLATED HEMOGLOBIN (HGB A1C): Hemoglobin A1C: 5.8 % — AB (ref 4.0–5.6)

## 2023-10-03 LAB — GLUCOSE, CAPILLARY: Glucose-Capillary: 86 mg/dL (ref 70–99)

## 2023-10-03 MED ORDER — GABAPENTIN 400 MG PO CAPS: 400.0000 mg | ORAL_CAPSULE | Freq: Two times a day (BID) | ORAL | 2 refills | Status: AC

## 2023-10-03 MED ORDER — POTASSIUM CHLORIDE CRYS ER 20 MEQ PO TBCR: 40.0000 meq | EXTENDED_RELEASE_TABLET | Freq: Two times a day (BID) | ORAL | 3 refills | Status: DC

## 2023-10-03 NOTE — Patient Instructions (Signed)
 Thank you, Ms.[name] for allowing us  to provide your care today. Today we discussed:  - Lymphedema referral is placed to [place]  - Cologuard is sent  I have ordered the following labs for you:   Lab Orders         Glucose, capillary         Cologuard         POC Hbg A1C      Tests ordered today:  None   Referrals ordered today:    Referral Orders         Ambulatory referral to Physical Therapy         Ambulatory referral to Podiatry      I have ordered the following medication/changed the following medications:   Stop the following medications: Medications Discontinued During This Encounter  Medication Reason   potassium chloride  SA (KLOR-CON  M) 20 MEQ tablet Reorder   gabapentin  (NEURONTIN ) 400 MG capsule Reorder     Start the following medications: Meds ordered this encounter  Medications   gabapentin  (NEURONTIN ) 400 MG capsule    Sig: Take 1 capsule (400 mg total) by mouth 2 (two) times daily.    Dispense:  60 capsule    Refill:  2   potassium chloride  SA (KLOR-CON  M) 20 MEQ tablet    Sig: Take 2 tablets (40 mEq total) by mouth 2 (two) times daily.    Dispense:  90 tablet    Refill:  3    Please cancel all previous orders for current medication. Change in dosage or pill size.     Follow up: 6 months    Remember:   Should you have any questions or concerns please call the internal medicine clinic at [phone].     Rayann Atway, D.O. [facility]

## 2023-10-03 NOTE — Assessment & Plan Note (Signed)
>>  ASSESSMENT AND PLAN FOR DIABETES (HCC) WRITTEN ON 10/03/2023  1:16 PM BY TAWKALIYAR, ROYA, DO  Lab Results  Component Value Date   HGBA1C 5.8 (A) 10/03/2023   Outpatient medication regimen includes Wegovy  2.4 mg weekly and Jardiance .  Reports that she had an ophthalmology appointment earlier this month. -Continue Wegovy  and Jardiance

## 2023-10-03 NOTE — Assessment & Plan Note (Signed)
 This is a pleasant 73 year old female presents to clinic, reports that she is feeling well this morning.  Remains in A-fib rhythm, though controlled rate.  Outpatient medication regimen consistent metoprolol  25 mg daily and Xarelto  20 mg daily, compliant.  No refills needed. -Continue metoprolol  25 mg daily and Xarelto  20 mg daily

## 2023-10-03 NOTE — Assessment & Plan Note (Signed)
 Longstanding history of venous stasis insufficiency with chronic skin changes such as skin thickening and hyperkeratosis.  Previously she has received Unna boots.  Per the last office visit on 06/19/2023, patient was referred for PT for lymphedema at [facility], but patient did not go. Patient does follow Dr Lanis who recommended Lymphedema Clinic in [place].  On exam today patient reports swelling around ankles and toes that comes and goes.  Reports that she is unable to wrap her own legs, she is wheelchair-bound.  Patient reports that she takes gabapentin  40 mg twice daily and oxycodone  15 mg 3 times daily. -Referral to lymphedema clinic is sent at [facility] location

## 2023-10-03 NOTE — Progress Notes (Signed)
 Established Patient Office Visit  Subjective   Patient ID: Sharon Vasquez, female    DOB: [birth year]  Age: 73 y.o. [name]: [id number]  Chief Complaint  Patient presents with   Medication Refill   Knee Pain    HPI This is a 73 year old female with past medical history of chronic venous insufficiency, HFrecEF, COPD, and venous insufficiency and diabetes who presents today for follow up on DM and referral paper work.   ROS   As per assessment and plan  Objective:     BP 135/88 (BP Location: Right Arm, Patient Position: Sitting, Cuff Size: Normal)   Pulse 70   Temp 97.9 F (36.6 C) (Oral)   Ht 6' 2 (1.88 m)   Wt (!) 336 lb 12.8 oz (152.8 kg)   SpO2 100%   BMI 43.24 kg/m  BP Readings from Last 3 Encounters:  10/03/23 135/88  10/01/23 135/69  08/23/23 120/84   Wt Readings from Last 3 Encounters:  10/03/23 (!) 336 lb 12.8 oz (152.8 kg)  10/01/23 (!) 344 lb 3.2 oz (156.1 kg)  08/23/23 (!) 342 lb (155.1 kg)      Physical Exam  General: Sitting in chair, no acute distress Cardiovascular: Regular rate, irregular rhythm Pulmonary: Breathing comfortably, no wheezing or crackles Abdomen: Soft, nontender, non distended, bowel sounds present MSK: Chronic venous insufficiency with skin thickening and hyperkeratosis  Results for orders placed or performed in visit on 10/03/23  Glucose, capillary  Result Value Ref Range   Glucose-Capillary 86 70 - 99 mg/dL  POC Hbg J8R  Result Value Ref Range   Hemoglobin A1C 5.8 (A) 4.0 - 5.6 %   HbA1c POC (<> result, manual entry)     HbA1c, POC (prediabetic range)     HbA1c, POC (controlled diabetic range)       Last hemoglobin A1c Lab Results  Component Value Date   HGBA1C 5.8 (A) 10/03/2023      Assessment & Plan:   Patient is discussed with Dr Trudy  Problem List Items Addressed This Visit       Cardiovascular and Mediastinum   Benign essential HTN (Chronic)   BP Readings from Last 3 Encounters:  10/03/23 135/88   10/01/23 135/69  08/23/23 120/84   Outpatient medication regimen includes losartan  25 mg daily, torsemide  60 mg daily, spironolactone  25 mg daily, Toprol  25 mg daily, Jardiance  10 mg daily.  Patient denies any chest pain or shortness of breath.  Denies any dizzy spells.  Patient is compliant on her medications. -Continue losartan , torsemide , spironolactone , metoprolol , Jardiance .      Chronic venous insufficiency (Chronic)   Longstanding history of venous stasis insufficiency with chronic skin changes such as skin thickening and hyperkeratosis.  Previously she has received Unna boots.  Per the last office visit on 06/19/2023, patient was referred for PT for lymphedema at [facility], but patient did not go. Patient does follow Dr Lanis who recommended Lymphedema Clinic in [place].  On exam today patient reports swelling around ankles and toes that comes and goes.  Reports that she is unable to wrap her own legs, she is wheelchair-bound.  Patient reports that she takes gabapentin  40 mg twice daily and oxycodone  15 mg 3 times daily. -Referral to lymphedema clinic is sent at [facility] location      Relevant Orders   Ambulatory referral to Physical Therapy   Atrial fibrillation with controlled ventricular response [facility])   This is a pleasant 73 year old female presents to clinic, reports that she  is feeling well this morning.  Remains in A-fib rhythm, though controlled rate.  Outpatient medication regimen consistent metoprolol  25 mg daily and Xarelto  20 mg daily, compliant.  No refills needed. -Continue metoprolol  25 mg daily and Xarelto  20 mg daily      Heart failure with reduced ejection fraction (HCC)   OP medication regimen includes losartan  25 mg daily, torsemide  60 mg daily, spironolactone  25 mg daily, metoprolol  25 mg daily, Jardiance  10 mg daily. She is currently asymptomatic. - Continue above medications       Relevant Medications   potassium chloride  SA (KLOR-CON  M) 20 MEQ tablet      Endocrine   Diabetes (HCC) - Primary   Lab Results  Component Value Date   HGBA1C 5.8 (A) 10/03/2023   Outpatient medication regimen includes Wegovy  2.4 mg weekly and Jardiance .  Reports that she had an ophthalmology appointment earlier this month. -Continue Wegovy  and Jardiance       Relevant Medications   gabapentin  (NEURONTIN ) 400 MG capsule   Other Relevant Orders   POC Hbg A1C (Completed)   Ambulatory referral to Podiatry     Other   Healthcare maintenance   Cologuard is sent      Relevant Orders   Cologuard   Other Visit Diagnoses       Chronic heart failure with preserved ejection fraction (HFpEF) (HCC)       Relevant Medications   potassium chloride  SA (KLOR-CON  M) 20 MEQ tablet       Return in about 6 months (around 04/04/2024).    [name], DO

## 2023-10-03 NOTE — Assessment & Plan Note (Addendum)
 BP Readings from Last 3 Encounters:  10/03/23 135/88  10/01/23 135/69  08/23/23 120/84   Outpatient medication regimen includes losartan  25 mg daily, torsemide  60 mg daily, spironolactone  25 mg daily, Toprol  25 mg daily, Jardiance  10 mg daily.  Patient denies any chest pain or shortness of breath.  Denies any dizzy spells.  Patient is compliant on her medications. -Continue losartan , torsemide , spironolactone , metoprolol , Jardiance .

## 2023-10-03 NOTE — Assessment & Plan Note (Addendum)
 Lab Results  Component Value Date   HGBA1C 5.8 (A) 10/03/2023   Outpatient medication regimen includes Wegovy  2.4 mg weekly and Jardiance .  Reports that she had an ophthalmology appointment earlier this month. -Continue Wegovy  and Jardiance 

## 2023-10-03 NOTE — Assessment & Plan Note (Signed)
 OP medication regimen includes losartan  25 mg daily, torsemide  60 mg daily, spironolactone  25 mg daily, metoprolol  25 mg daily, Jardiance  10 mg daily. She is currently asymptomatic. - Continue above medications

## 2023-10-03 NOTE — Assessment & Plan Note (Signed)
 Cologuard is sent

## 2023-10-08 NOTE — Progress Notes (Signed)
 Internal Medicine Clinic Attending  Case discussed with the resident at the time of the visit.  We reviewed the resident's history and exam and pertinent patient test results.  I agree with the assessment, diagnosis, and plan of care documented in the resident's note.

## 2023-10-17 ENCOUNTER — Telehealth: Payer: Self-pay

## 2023-10-17 NOTE — Telephone Encounter (Addendum)
 Chioma from [employer] called regarding a order they received for colo guard. Per Chioma the ICD code that was sent with the order is not accepted. The preferred codes that are accepted are E72.11 and Z12.12 Chioma is requesting a new order.

## 2023-10-18 ENCOUNTER — Other Ambulatory Visit: Payer: Self-pay | Admitting: Student

## 2023-10-18 ENCOUNTER — Other Ambulatory Visit: Payer: Self-pay

## 2023-10-18 NOTE — Telephone Encounter (Signed)
 Copied from CRM [id number]. Topic: Clinical - Medication Refill >> Oct 18, 2023  2:53 PM Zane F wrote: Patient called in stating that she is in need of the following prescription. Patient is completely out of the prescription and she doesn't have a dose on hand for her regular injection on Monday. Please assist.  Medication: WEGOVY  2.4 MG/0.75ML SOAJ  Has the patient contacted their pharmacy? Yes  This is the patient's preferred pharmacy:   [id number] [address], [place] - [address] AT [facility] [address] [place] [address] Phone: [phone] Fax: [phone]  Is this the correct pharmacy for this prescription? Yes   Has the prescription been filled recently? No  Is the patient out of the medication? Yes  Has the patient been seen for an appointment in the last year OR does the patient have an upcoming appointment? Yes  Can we respond through MyChart? Yes  Agent: Please be advised that Rx refills may take up to 3 business days. We ask that you follow-up with your pharmacy.

## 2023-10-19 ENCOUNTER — Other Ambulatory Visit: Payer: Self-pay | Admitting: Student

## 2023-10-19 DIAGNOSIS — Z1211 Encounter for screening for malignant neoplasm of colon: Secondary | ICD-10-CM

## 2023-10-19 NOTE — Telephone Encounter (Signed)
 Copied from CRM [id number]. Topic: Clinical - Request for Lab/Test Order >> Oct 19, 2023  9:19 AM [name] wrote: Reason for CRM: Nathanel from [employer] has requested a call back in regard to reference #[id number], advised the provider that the Cologuard Test kit has not been shipped out to patient due to the ICD-10 code which is not covered by insurance. Ms Nathanel has requested that the order includes the updated ICD-10 to process the shipment

## 2023-10-23 ENCOUNTER — Ambulatory Visit (INDEPENDENT_AMBULATORY_CARE_PROVIDER_SITE_OTHER): Admitting: Podiatry

## 2023-10-23 ENCOUNTER — Encounter: Payer: Self-pay | Admitting: Podiatry

## 2023-10-23 ENCOUNTER — Other Ambulatory Visit: Payer: Self-pay

## 2023-10-23 DIAGNOSIS — E119 Type 2 diabetes mellitus without complications: Secondary | ICD-10-CM

## 2023-10-23 DIAGNOSIS — I890 Lymphedema, not elsewhere classified: Secondary | ICD-10-CM | POA: Diagnosis not present

## 2023-10-23 DIAGNOSIS — E1142 Type 2 diabetes mellitus with diabetic polyneuropathy: Secondary | ICD-10-CM | POA: Diagnosis not present

## 2023-10-23 DIAGNOSIS — M2141 Flat foot [pes planus] (acquired), right foot: Secondary | ICD-10-CM

## 2023-10-23 DIAGNOSIS — B351 Tinea unguium: Secondary | ICD-10-CM | POA: Diagnosis not present

## 2023-10-23 DIAGNOSIS — M79675 Pain in left toe(s): Secondary | ICD-10-CM

## 2023-10-23 DIAGNOSIS — M79674 Pain in right toe(s): Secondary | ICD-10-CM | POA: Diagnosis not present

## 2023-10-23 DIAGNOSIS — M2142 Flat foot [pes planus] (acquired), left foot: Secondary | ICD-10-CM

## 2023-10-23 NOTE — Progress Notes (Signed)
 ANNUAL DIABETIC FOOT EXAM  Subjective: Sharon Vasquez presents today for annual diabetic foot exam.  Chief Complaint  Patient presents with   Diabetes    DFC A1C 5.8. Toenail trim. Lymphedema bilateral feet and legs. LOV with PCP 10/2023.   Patient confirms h/o diabetes.  Patient has h/o lymphedema. Has pending consult from Jolynn Pack to [facility] for management of lymphedema.  Patient has been diagnosed with neuropathy.  Azadegan, Maryam, MD is patient's PCP.  Past Medical History:  Diagnosis Date   A-fib (HCC)    Acute cystitis without hematuria 11/02/2022   Acute hypoxemic respiratory failure (HCC) 02/20/2022   Acute on chronic HFrEF (heart failure with reduced ejection fraction) (HCC) 10/28/2022   Acute on chronic hypoxic respiratory failure (HCC) 01/04/2022   Acute upper respiratory infection 01/08/2022   Anxiety    Arthritis    qwhre; joints, back (04/17/2017)   Atrial fibrillation (HCC)    Benign breast cyst in female, left 01/08/2017   Found by Screening mammogram, evaluated by U/S on 01/08/17 and determined to be a benign simple breast cyst.   Breast cancer (HCC)    Cellulitis of left lower leg 05/30/2017   CHF (congestive heart failure) (HCC)    Chronic low back pain 08/21/2016   Chronic lower back pain    Chronic venous insufficiency    /notes 05/30/2017   COPD (chronic obstructive pulmonary disease) (HCC)    Depression    Diabetes (HCC) 01/08/2022   Diabetes mellitus without complication (HCC)    DVT (deep venous thrombosis) (HCC) 11/16/2016   Dysrhythmia    Esophageal dysmotility 11/10/2019   Previous workup by ENT with fiberoptic leryngoscopy which was normal. Dx with laryngeal pharyngeal reflux.   Foul smelling urine 05/04/2022   GERD (gastroesophageal reflux disease)    Headache    weekly for the last 3 months (04/17/2017)   History of hypokalemia 11/17/2022   History of pulmonary embolism    Pulmonary embolism   Hyperlipidemia     Hypertension    Hypokalemia 12/01/2022   Laryngopharyngeal reflux 12/18/2015   Morbid obesity (HCC)    PE (pulmonary embolism)    Pulmonary embolism (HCC) 09/21/2014   Right foot pain 03/03/2021   Sleep apnea    Stroke (HCC) 12/02/2021   Vocal cord polyp 12/18/2015   Patient Active Problem List   Diagnosis Date Noted   Acute on chronic respiratory failure (HCC) 12/01/2022   Neuropathic pain 11/17/2022   Cardiac arrest (HCC) 10/30/2022   Enlarged thoracic aorta (HCC) 05/09/2022   Healthcare maintenance 05/09/2022   Anxiety attack 03/17/2022   Diabetes (HCC) 01/08/2022   Prediabetes 04/09/2021   Goals of care, counseling/discussion 03/23/2021   Malignant neoplasm of upper-outer quadrant of right breast in female, estrogen receptor positive (HCC) 03/21/2021   Heart failure with reduced ejection fraction (HCC)    Osteoarthritis of left hip 10/07/2020   Venous stasis ulcer of left lower extremity (HCC) 08/18/2020   Hyperlipidemia 05/14/2020   Chronic respiratory failure with hypoxia (HCC) 09/30/2019   Urinary incontinence 08/21/2019   GERD (gastroesophageal reflux disease) 01/20/2019   OSA (obstructive sleep apnea) 11/14/2017   Chronic pain syndrome 07/27/2017   Major depression, recurrent, chronic (HCC) 02/01/2017   Osteoporosis 12/14/2016   Aortic atherosclerosis (HCC) 07/17/2016   Chronic venous insufficiency 07/12/2016   Chronic anticoagulation 07/12/2016   Tobacco use 07/11/2016   COPD (chronic obstructive pulmonary disease) (HCC)    Atrial fibrillation with controlled ventricular response (HCC)    Morbid obesity (  HCC)    Peripheral vascular disease (HCC)    Benign essential HTN    Primary osteoarthritis of both knees 09/16/2014   Past Surgical History:  Procedure Laterality Date   ABDOMINAL HYSTERECTOMY     APPENDECTOMY     BALLOON DILATION N/A 03/09/2020   Procedure: BALLOON DILATION;  Surgeon: Elicia Claw, MD;  Location: [facility];  Service:  Gastroenterology;  Laterality: N/A;   BIOPSY  03/09/2020   Procedure: BIOPSY;  Surgeon: Elicia Claw, MD;  Location: [facility];  Service: Gastroenterology;;   BIOPSY  04/12/2021   Procedure: BIOPSY;  Surgeon: Elicia Claw, MD;  Location: [facility];  Service: Gastroenterology;;   BREAST BIOPSY Right 03/11/2021   us  biopsy/ coil clip/ path pending   BREAST BIOPSY Right 03/11/2021   us  biopsy/ ribbone clip/ path pending   BREAST CYST EXCISION Left    six o'clock   BREAST LUMPECTOMY Left    BREAST LUMPECTOMY WITH RADIOACTIVE SEED LOCALIZATION Right 05/02/2021   Procedure: RIGHT BREAST LUMPECTOMY WITH RADIOACTIVE SEED LOCALIZATION;  Surgeon: Curvin Deward MOULD, MD;  Location: [facility];  Service: General;  Laterality: Right;   CARDIAC CATHETERIZATION     CHOLECYSTECTOMY     DILATION AND CURETTAGE OF UTERUS     ESOPHAGOGASTRODUODENOSCOPY (EGD) WITH PROPOFOL  N/A 03/09/2020   Procedure: ESOPHAGOGASTRODUODENOSCOPY (EGD) WITH PROPOFOL ;  Surgeon: Elicia Claw, MD;  Location: [facility];  Service: Gastroenterology;  Laterality: N/A;   ESOPHAGOGASTRODUODENOSCOPY (EGD) WITH PROPOFOL  N/A 04/12/2021   Procedure: ESOPHAGOGASTRODUODENOSCOPY (EGD) WITH PROPOFOL ;  Surgeon: Elicia Claw, MD;  Location: [facility];  Service: Gastroenterology;  Laterality: N/A;   IR ABLATE LIVER CRYOABLATION  07/23/2019   IR RADIOLOGIST EVAL & MGMT  07/18/2019   RIGHT/LEFT HEART CATH AND CORONARY ANGIOGRAPHY N/A 11/01/2022   Procedure: RIGHT/LEFT HEART CATH AND CORONARY ANGIOGRAPHY;  Surgeon: Swaziland, Peter M, MD;  [facility] INV[facility] CV LAB;  Service: Cardiovascular;  Laterality: N/A;   SAVORY DILATION N/A 04/12/2021   Procedure: SAVORY DILATION;  Surgeon: Elicia Claw, MD;  Location: [facility];  Service: Gastroenterology;  Laterality: N/A;   TONSILLECTOMY AND ADENOIDECTOMY     TUBAL LIGATION     Current Outpatient Medications on File Prior to Visit  Medication Sig Dispense Refill    albuterol  (VENTOLIN  HFA) 108 (90 Base) MCG/ACT inhaler Inhale 2 puffs into the lungs every 4 (four) hours as needed for shortness of breath. 18 g 1   alendronate  (FOSAMAX ) 70 MG tablet Take 70 mg by mouth once a week. Take with a full glass of water on an empty stomach, Patient hasn't started medication.     atorvastatin  (LIPITOR ) 80 MG tablet Take 1 tablet (80 mg total) by mouth daily. 100 tablet 2   empagliflozin  (JARDIANCE ) 10 MG TABS tablet TAKE 1 TABLET(10 MG) BY MOUTH DAILY BEFORE BREAKFAST 30 tablet 11   esomeprazole  (NEXIUM ) 40 MG capsule Take 1 capsule (40 mg total) by mouth daily as needed (for heartburn or indigestion). 90 capsule 1   Fluticasone -Umeclidin-Vilant (TRELEGY ELLIPTA ) 100-62.5-25 MCG/ACT AEPB Inhale 1 puff into the lungs daily. 60 each 5   gabapentin  (NEURONTIN ) 400 MG capsule Take 1 capsule (400 mg total) by mouth 2 (two) times daily. 60 capsule 2   letrozole  (FEMARA ) 2.5 MG tablet Take 1 tablet (2.5 mg total) by mouth daily. TAKE 1 TABLET(2.5 MG) BY MOUTH DAILY 90 tablet 3   LORazepam  (ATIVAN ) 0.5 MG tablet Take 0.5 mg by mouth daily as needed for anxiety.     losartan  (COZAAR ) 25 MG tablet Take  1 tablet (25 mg total) by mouth daily. 30 tablet 11   naloxone (NARCAN) nasal spray 4 mg/0.1 mL Place 1 spray into the nose once as needed (for overdose suspected). for opioid overdose     oxybutynin  (DITROPAN ) 5 MG tablet TAKE 1 TABLET BY MOUTH THREE TIMES DAILY 90 tablet 3   oxyCODONE  (ROXICODONE ) 15 MG immediate release tablet Take 15 mg by mouth See admin instructions. Take 15mg  (1 tablet) by mouth every morning and 15mg  (1 tablet) at bedtime in addition to twice during the day as needed.     OXYGEN  Inhale 2-3 L into the lungs daily.     potassium chloride  SA (KLOR-CON  M) 20 MEQ tablet Take 2 tablets (40 mEq total) by mouth 2 (two) times daily. 90 tablet 3   PRESCRIPTION MEDICATION Inhale into the lungs at bedtime. cpap     rivaroxaban  (XARELTO ) 20 MG TABS tablet Take 1 tablet  (20 mg total) by mouth every morning. 90 tablet 3   spironolactone  (ALDACTONE ) 25 MG tablet Take 1 tablet (25 mg total) by mouth daily. 90 tablet 3   Torsemide  60 MG TABS Take 60 mg by mouth daily. 90 tablet 3   No current facility-administered medications on file prior to visit.    No Known Allergies Social History   Occupational History   Not on file  Tobacco Use   Smoking status: Former    Current packs/day: 0.00    Average packs/day: 2.0 packs/day for 60.4 years (120.8 ttl pk-yrs)    Types: Cigarettes    Start date: 08/16/1961    Quit date: 01/02/2022    Years since quitting: 1.8   Smokeless tobacco: Never   Tobacco comments:    Pt smokes about 4 ciggs daily. Stopped 01/02/2022   Vaping Use   Vaping status: Never Used  Substance and Sexual Activity   Alcohol use: No   Drug use: No   Sexual activity: Not Currently    Partners: Male   Family History  Problem Relation Age of Onset   Breast cancer Mother        before age 50   Hypertension Mother    Hyperlipidemia Mother    Hypertension Maternal Grandfather    Hyperlipidemia Maternal Grandfather    Immunization History  Administered Date(s) Administered   Moderna Sars-Covid-2 Vaccination 08/20/2019, 09/11/2019   PFIZER(Purple Top)SARS-COV-2 Vaccination 03/17/2020   Pneumococcal Conjugate-13 02/01/2017   Pneumococcal Polysaccharide-23 02/12/2020   Tdap 06/05/2019     Review of Systems: Negative except as noted in the HPI.   Objective: There were no vitals filed for this visit.  Sharon Vasquez is a pleasant 73 y.o. female morbidly obese in NAD.  AAO X 3.  Diabetic foot exam was performed with the following findings:   Intact posterior tibialis and dorsalis pedis pulses Vascular Examination: Capillary refill time <3 seconds b/l. Palpable pedal pulses. Lymphedema b/l LE. Using ace bandages b/l.  No pain with calf compression b/l. Skin temperature gradient WNL b/l. No cyanosis or clubbing b/l. No ischemia or gangrene  noted b/l.   Neurological Examination: Pt has subjective symptoms of neuropathy. Protective sensation diminished with 10g monofilament b/l.  Dermatological Examination: Scarring consisitent with chronic lymphedema b/l. Dried heme on legs b/l. No signs of infection noted b/l. No interdigital macerations.   Toenails 1-5 b/l thick, discolored, elongated with subungual debris and pain on dorsal palpation.   Musculoskeletal Examination: Muscle strength 5/5 to all lower extremity muscle groups bilaterally. Pes planus No pain, crepitus or  joint limitation noted with ROM b/l LE. . Mobility via motorized chair assistance.  Radiographs: None     Lab Results  Component Value Date   HGBA1C 5.8 (A) 10/03/2023   ADA Risk Categorization: High Risk  Patient has one or more of the following: Loss of protective sensation Absent pedal pulses Severe Foot deformity History of foot ulcer  Assessment: 1. Pain due to onychomycosis of toenails of both feet   2. Pes planus of both feet   3. Lymphedema of both lower extremities   4. Type 2 diabetes mellitus with peripheral neuropathy (HCC)   5. Encounter for diabetic foot exam (HCC)     Plan: Diabetic foot examination performed today. All patient's and/or POA's questions/concerns addressed on today's visit. She is seeing Wound Care on next week for lymphedema. Toenails 1-5 debrided in length and girth without incident. Continue foot and shoe inspections daily. Monitor blood glucose per PCP/Endocrinologist's recommendations. Continue soft, supportive shoe gear daily. Report any pedal injuries to medical professional.  Discussed neuropathy symptoms with patient. She is on gabapentin . Discussed adding topical compounded neuropathy cream available through [employer]. She will think about it.. Return in about 3 months (around 01/23/2024).  Sharon Vasquez, DPM      [place] LOCATION: [address], [place] [address]                   Office [phone]   [facility] LOCATION: [address] [place], [place] [address] Office [phone]

## 2023-10-24 ENCOUNTER — Other Ambulatory Visit: Payer: Self-pay

## 2023-10-25 ENCOUNTER — Telehealth: Payer: Self-pay

## 2023-10-25 ENCOUNTER — Telehealth: Payer: Self-pay | Admitting: *Deleted

## 2023-10-25 MED ORDER — WEGOVY 2.4 MG/0.75ML ~~LOC~~ SOAJ: 2.4000 mg | SUBCUTANEOUS | 3 refills | Status: AC

## 2023-10-25 NOTE — Telephone Encounter (Signed)
 Patient is calling to check status of her medication refill, she stated that she has been trying to get this refill since last week and is very frustrated. Patient would like a callback to let her know what is causing the delay and stated that she will be stopping by the office if she does not get a callback.

## 2023-10-25 NOTE — Patient Instructions (Signed)
 [name] - I am sorry I was unable to reach you today for our scheduled appointment. I work with Azadegan, Maryam, MD and am calling to support your healthcare needs. Please contact me at [phone] at your earliest convenience. I look forward to speaking with you soon.   Thank you,  Wilbert Diver RN, BSN, [facility]  [facility], [facility] Health    Care Coordinator Phone: [phone]

## 2023-10-25 NOTE — Telephone Encounter (Signed)
 Will resend to PCP and team Copied from CRM [id number]. Topic: Clinical - Prescription Issue >> Oct 25, 2023 10:06 AM Alfonso ORN wrote: Reason for CRM: dakota  calling from  [facility] [address] status of refill was faxed over 2 days ago for the WEGOVY  2.4 MG/0.75ML SOAJ  Can someone give callback on status of the refill  [facility] #[id number] [address], [place] - [address] AT [facility] [address] [place] [address] Phone: [phone] Fax: [phone]

## 2023-10-25 NOTE — Telephone Encounter (Signed)
 Will forward to Dr . Azadegan and IMP Red team.

## 2023-10-26 MED ORDER — METOPROLOL SUCCINATE ER 25 MG PO TB24: 25.0000 mg | ORAL_TABLET | Freq: Every day | ORAL | 2 refills | Status: AC

## 2023-10-28 ENCOUNTER — Encounter: Payer: Self-pay | Admitting: Podiatry

## 2023-11-05 ENCOUNTER — Telehealth: Payer: Self-pay | Admitting: *Deleted

## 2023-11-05 NOTE — Progress Notes (Signed)
 Complex Care Management Care Guide Note  11/05/2023 Name: Sharon Vasquez [name]: [id number] DOB: May 05, 1950  Sharon Vasquez is a 73 y.o. year old female who is a primary care patient of Azadegan, Maryam, MD and is actively engaged with the care management team. I reached out to [name] by phone today to assist with re-scheduling  with the RN Case Manager.  Follow up plan: Unsuccessful telephone outreach attempt made. A HIPAA compliant phone message was left for the patient providing contact information and requesting a return call.  Harlene Satterfield  [facility]  [facility], [facility] Guide  Direct Dial: [phone]  Fax [phone]

## 2023-11-12 ENCOUNTER — Telehealth: Payer: Self-pay | Admitting: *Deleted

## 2023-11-12 DIAGNOSIS — I480 Paroxysmal atrial fibrillation: Secondary | ICD-10-CM

## 2023-11-12 DIAGNOSIS — I739 Peripheral vascular disease, unspecified: Secondary | ICD-10-CM

## 2023-11-12 MED ORDER — RIVAROXABAN 20 MG PO TABS: 20.0000 mg | ORAL_TABLET | Freq: Every morning | ORAL | 0 refills | Status: DC

## 2023-11-12 NOTE — Progress Notes (Signed)
 Complex Care Management Care Guide Note  11/12/2023 Name: Sharon Vasquez [name]: [id number] DOB: 08-Jun-1950  Sharon Vasquez is a 73 y.o. year old female who is a primary care patient of Azadegan, Maryam, MD and is actively engaged with the care management team. I reached out to [name] by phone today to assist with re-scheduling  with the RN Case Manager.  Follow up plan: Telephone appointment with complex care management team member scheduled for:  8/14  Harlene Satterfield  [facility]  [facility] Guide  Direct Dial: [phone]  Fax [phone]

## 2023-11-12 NOTE — Telephone Encounter (Signed)
 Copied from CRM [id number]. Topic: Clinical - Medication Question >> Nov 12, 2023  1:58 PM Mercer PEDLAR wrote: Reason for CRM: Patient would like a callback to discuss her medications. She stated that there has been a lot of issues she would like to discuss.   Callback: [phone]

## 2023-11-12 NOTE — Telephone Encounter (Addendum)
 I returned pt's call. Stated she needs a refill on Gabapentin  and Xarelto . She should have refills on Gabapentin ; I will call the pharmacy. Then she stated she had an Ortho referral; she was sent to [facility]. Then they sent her to the new building across the street from us . Stated she needs the med that was prescribed so she needs their telephone#. Looking thru her chart,I do not see where she went - I will ask Chilon for assistance. I called [facility] who stated they will get Gabapentin  ready ; ready in 30 minutes. Pt was informed ; no answer, I had to leave a message. Per OV 10/03/23 - continue Xarelto  20 mg. I will send a refill (MD to check appropriateness).

## 2023-11-14 LAB — COLOGUARD: COLOGUARD: NEGATIVE

## 2023-11-15 ENCOUNTER — Other Ambulatory Visit: Payer: Self-pay

## 2023-11-15 NOTE — Patient Outreach (Addendum)
 Patient was scheduled for a visit but has declined enrollment. Patient hung up before I could give her my contact information. Contact information will be in patient's My Chart.  One more attempt will be made over the next 6 week period.

## 2023-11-15 NOTE — Patient Instructions (Signed)
 Visit Information  Please don't hesitate to contact me if I can be of assistance to you should you choose to enroll in our Complex Care management Program     Hendricks Her RN, BSN  [place] [facility] RN Case Manager   Direct [phone]

## 2023-11-20 ENCOUNTER — Ambulatory Visit: Payer: Self-pay | Admitting: Student

## 2023-12-21 ENCOUNTER — Telehealth: Payer: Self-pay | Admitting: *Deleted

## 2023-12-21 NOTE — Telephone Encounter (Signed)
 Copied from CRM [id number]. Topic: Clinical - Order For Equipment >> Dec 21, 2023 10:28 AM Marda G wrote: Please call patient  regarding documents that need to be faxed. For the leg wrap  Chart info under Media:  documents 11/29/2023  13:50p  Refax to [phone]   Documents not received

## 2023-12-23 ENCOUNTER — Other Ambulatory Visit (HOSPITAL_BASED_OUTPATIENT_CLINIC_OR_DEPARTMENT_OTHER): Payer: Self-pay | Admitting: Adult Health

## 2023-12-23 DIAGNOSIS — K219 Gastro-esophageal reflux disease without esophagitis: Secondary | ICD-10-CM

## 2023-12-24 ENCOUNTER — Telehealth: Payer: Self-pay | Admitting: *Deleted

## 2023-12-24 NOTE — Telephone Encounter (Signed)
 Will forward to [facility], CMA and [facility] Referral Coordinator.  Copied from CRM [id number]. Topic: Clinical - Order For Equipment >> Dec 21, 2023 10:28 AM Marda G wrote: Please call patient  regarding documents that need to be faxed. For the leg wrap  Chart info under Media:  documents 11/29/2023  13:50p  Refax to [phone]   Documents not received >> Dec 21, 2023  3:25 PM Susanna ORN wrote: Patient calling to get an update and was told to call back today at [facility]. Office is closed. Please give patient a call back on Monday. States the paperwork was suppose to been faxed and no one has faxed it. CB #: [phone].

## 2023-12-24 NOTE — Telephone Encounter (Signed)
 Message has been sent to Chilon.

## 2023-12-24 NOTE — Telephone Encounter (Signed)
 Copied from CRM [id number]. Topic: Clinical - Order For Equipment >> Dec 21, 2023 10:28 AM Marda G wrote: Please call patient  regarding documents that need to be faxed. For the leg wrap  Chart info under Media:  documents 11/29/2023  13:50p  Refax to [phone]   Documents not received >> Dec 21, 2023  3:25 PM Susanna ORN wrote: Patient calling to get an update and was told to call back today at [facility]. Office is closed. Please give patient a call back on Monday. States the paperwork was suppose to been faxed and no one has faxed it. CB #: [phone].

## 2024-01-01 ENCOUNTER — Encounter: Payer: Self-pay | Admitting: Student

## 2024-01-01 ENCOUNTER — Ambulatory Visit (INDEPENDENT_AMBULATORY_CARE_PROVIDER_SITE_OTHER): Payer: Self-pay | Admitting: Student

## 2024-01-01 VITALS — BP 138/89 | HR 78 | Temp 98.0°F | Ht 74.0 in | Wt 329.0 lb

## 2024-01-01 DIAGNOSIS — J069 Acute upper respiratory infection, unspecified: Secondary | ICD-10-CM

## 2024-01-01 DIAGNOSIS — R7303 Prediabetes: Secondary | ICD-10-CM | POA: Diagnosis not present

## 2024-01-01 DIAGNOSIS — I5032 Chronic diastolic (congestive) heart failure: Secondary | ICD-10-CM

## 2024-01-01 DIAGNOSIS — I5020 Unspecified systolic (congestive) heart failure: Secondary | ICD-10-CM

## 2024-01-01 DIAGNOSIS — G4733 Obstructive sleep apnea (adult) (pediatric): Secondary | ICD-10-CM

## 2024-01-01 MED ORDER — FLUTICASONE PROPIONATE 50 MCG/ACT NA SUSP: 1.0000 | Freq: Every day | NASAL | 11 refills | Status: AC

## 2024-01-01 MED ORDER — ZEPBOUND 7.5 MG/0.5ML ~~LOC~~ SOAJ: 7.5000 mg | SUBCUTANEOUS | 2 refills | Status: DC

## 2024-01-01 MED ORDER — SPIRONOLACTONE 25 MG PO TABS: 25.0000 mg | ORAL_TABLET | Freq: Every day | ORAL | 3 refills | Status: AC

## 2024-01-01 NOTE — Assessment & Plan Note (Signed)
 Body mass index is 42.24 kg/m. Weight is down to 329 lbs from 336 lbs. On Wegovy  2.4 mg weekly and tolerating well. Given hx of OSA on CPAP, will transition and up titrate to Zepbound 7.5 mg weekly (Wegovy  2.4 mg about Zepbound 5 mg).   Plan -Transition and up titrate to Zepbound 7.5 mg weekly x 4 weeks then titrate up as tolerated

## 2024-01-01 NOTE — Patient Instructions (Addendum)
 Thank you, [name] for allowing us  to provide your care today. Today we discussed:  -I am glad your symptoms are not worsening. Continue to monitor.  -Will check with insurance about Zepbound 7.5 mg weekly to help with sleep apnea and weight loss -Continue Trelegy and use albuterol  as needed -Start Flonase  for nasal congestion. Spray into each nostril daily.  -If your symptoms worsen and worsening shortness of breath or develop fevers. Call our office and we can start treatment for COPD flare up.   I have ordered the following medication/changed the following medications:  Start the following medications: Meds ordered this encounter  Medications   spironolactone  (ALDACTONE ) 25 MG tablet    Sig: Take 1 tablet (25 mg total) by mouth daily.    Dispense:  90 tablet    Refill:  3   fluticasone  (FLONASE ) 50 MCG/ACT nasal spray    Sig: Place 1 spray into both nostrils daily.    Dispense:  16 g    Refill:  11   tirzepatide (ZEPBOUND) 7.5 MG/0.5ML Pen    Sig: Inject 7.5 mg into the skin once a week.    Dispense:  2 mL    Refill:  2     Follow up: 3-4 months   Should you have any questions or concerns please call the internal medicine clinic at [phone].    Sameka Bagent, D.O. [facility]

## 2024-01-01 NOTE — Assessment & Plan Note (Signed)
 Patient requested repeat A1c today. Last A1c 5.8 on 10/03/23.

## 2024-01-01 NOTE — Progress Notes (Signed)
 CC: Acute visit  HPI: Sharon Vasquez is a 73 y.o. female living with a history stated below and presents today for acute visit. Please see problem based assessment and plan for additional details.  Past Medical History:  Diagnosis Date   A-fib (HCC)    Acute cystitis without hematuria 11/02/2022   Acute hypoxemic respiratory failure (HCC) 02/20/2022   Acute on chronic HFrEF (heart failure with reduced ejection fraction) (HCC) 10/28/2022   Acute on chronic hypoxic respiratory failure (HCC) 01/04/2022   Acute upper respiratory infection 01/08/2022   Anxiety    Arthritis    qwhre; joints, back (04/17/2017)   Atrial fibrillation (HCC)    Benign breast cyst in female, left 01/08/2017   Found by Screening mammogram, evaluated by U/S on 01/08/17 and determined to be a benign simple breast cyst.   Breast cancer (HCC)    Cellulitis of left lower leg 05/30/2017   CHF (congestive heart failure) (HCC)    Chronic low back pain 08/21/2016   Chronic lower back pain    Chronic venous insufficiency    /notes 05/30/2017   COPD (chronic obstructive pulmonary disease) (HCC)    Depression    Diabetes (HCC) 01/08/2022   Diabetes mellitus without complication (HCC)    DVT (deep venous thrombosis) (HCC) 11/16/2016   Dysrhythmia    Esophageal dysmotility 11/10/2019   Previous workup by ENT with fiberoptic leryngoscopy which was normal. Dx with laryngeal pharyngeal reflux.   Foul smelling urine 05/04/2022   GERD (gastroesophageal reflux disease)    Headache    weekly for the last 3 months (04/17/2017)   History of hypokalemia 11/17/2022   History of pulmonary embolism    Pulmonary embolism   Hyperlipidemia    Hypertension    Hypokalemia 12/01/2022   Laryngopharyngeal reflux 12/18/2015   Morbid obesity (HCC)    PE (pulmonary embolism)    Pulmonary embolism (HCC) 09/21/2014   Right foot pain 03/03/2021   Sleep apnea    Stroke (HCC) 12/02/2021   Vocal cord polyp 12/18/2015    Current  Outpatient Medications on File Prior to Visit  Medication Sig Dispense Refill   albuterol  (VENTOLIN  HFA) 108 (90 Base) MCG/ACT inhaler Inhale 2 puffs into the lungs every 4 (four) hours as needed for shortness of breath. 18 g 1   alendronate  (FOSAMAX ) 70 MG tablet Take 70 mg by mouth once a week. Take with a full glass of water on an empty stomach, Patient hasn't started medication.     atorvastatin  (LIPITOR ) 80 MG tablet Take 1 tablet (80 mg total) by mouth daily. 100 tablet 2   empagliflozin  (JARDIANCE ) 10 MG TABS tablet TAKE 1 TABLET(10 MG) BY MOUTH DAILY BEFORE BREAKFAST 30 tablet 11   esomeprazole  (NEXIUM ) 40 MG capsule TAKE 1 CAPSULE(40 MG) BY MOUTH DAILY AS NEEDED FOR HEARTBURN OR INDIGESTION 90 capsule 1   Fluticasone -Umeclidin-Vilant (TRELEGY ELLIPTA ) 100-62.5-25 MCG/ACT AEPB Inhale 1 puff into the lungs daily. 60 each 5   gabapentin  (NEURONTIN ) 400 MG capsule Take 1 capsule (400 mg total) by mouth 2 (two) times daily. 60 capsule 2   letrozole  (FEMARA ) 2.5 MG tablet Take 1 tablet (2.5 mg total) by mouth daily. TAKE 1 TABLET(2.5 MG) BY MOUTH DAILY 90 tablet 3   LORazepam  (ATIVAN ) 0.5 MG tablet Take 0.5 mg by mouth daily as needed for anxiety.     losartan  (COZAAR ) 25 MG tablet Take 1 tablet (25 mg total) by mouth daily. 30 tablet 11   metoprolol  succinate (TOPROL -XL) 25 MG  24 hr tablet Take 1 tablet (25 mg total) by mouth daily. 100 tablet 2   naloxone (NARCAN) nasal spray 4 mg/0.1 mL Place 1 spray into the nose once as needed (for overdose suspected). for opioid overdose     oxybutynin  (DITROPAN ) 5 MG tablet TAKE 1 TABLET BY MOUTH THREE TIMES DAILY 90 tablet 3   oxyCODONE  (ROXICODONE ) 15 MG immediate release tablet Take 15 mg by mouth See admin instructions. Take 15mg  (1 tablet) by mouth every morning and 15mg  (1 tablet) at bedtime in addition to twice during the day as needed.     OXYGEN  Inhale 2-3 L into the lungs daily.     potassium chloride  SA (KLOR-CON  M) 20 MEQ tablet Take 2 tablets  (40 mEq total) by mouth 2 (two) times daily. 90 tablet 3   PRESCRIPTION MEDICATION Inhale into the lungs at bedtime. cpap     rivaroxaban  (XARELTO ) 20 MG TABS tablet Take 1 tablet (20 mg total) by mouth every morning. 90 tablet 0   Semaglutide -Weight Management (WEGOVY ) 2.4 MG/0.75ML SOAJ Inject 2.4 mg into the skin once a week. 3 mL 3   Torsemide  60 MG TABS Take 60 mg by mouth daily. 90 tablet 3   No current facility-administered medications on file prior to visit.    Family History  Problem Relation Age of Onset   Breast cancer Mother        before age 85   Hypertension Mother    Hyperlipidemia Mother    Hypertension Maternal Grandfather    Hyperlipidemia Maternal Grandfather     Social History   Socioeconomic History   Marital status: Divorced    Spouse name: Not on file   Number of children: Not on file   Years of education: Not on file   Highest education level: Not on file  Occupational History   Not on file  Tobacco Use   Smoking status: Former    Current packs/day: 0.00    Average packs/day: 2.0 packs/day for 60.4 years (120.8 ttl pk-yrs)    Types: Cigarettes    Start date: 08/16/1961    Quit date: 01/02/2022    Years since quitting: 1.9   Smokeless tobacco: Never   Tobacco comments:    Pt smokes about 4 ciggs daily. Stopped 01/02/2022   Vaping Use   Vaping status: Never Used  Substance and Sexual Activity   Alcohol use: No   Drug use: No   Sexual activity: Not Currently    Partners: Male  Other Topics Concern   Not on file  Social History Narrative   Lives in [facility] in [place]. Lives alone, but is dependent in ADLs/IADLs. Previously had HH PT and RN but dismissed them with plans to use the [employer]. Two daughters live nearby. Previously resided in [place].   Social Drivers of Health   Financial Resource Strain: Medium Risk (11/16/2022)   Overall Financial Resource Strain (CARDIA)    Difficulty of Paying Living Expenses: Somewhat hard  Food  Insecurity: No Food Insecurity (09/25/2023)   Hunger Vital Sign    Worried About Running Out of Food in the Last Year: Never true    Ran Out of Food in the Last Year: Never true  Transportation Needs: No Transportation Needs (09/25/2023)   PRAPARE - Administrator, Civil Service (Medical): No    Lack of Transportation (Non-Medical): No  Physical Activity: Inactive (11/16/2022)   Exercise Vital Sign    Days of Exercise per Week: 0  days    Minutes of Exercise per Session: 0 min  Stress: No Stress Concern Present (12/02/2021)   [employer] of Occupational Health - Occupational Stress Questionnaire    Feeling of Stress : Only a little  Social Connections: Moderately Integrated (03/15/2022)   Social Connection and Isolation Panel    Frequency of Communication with Friends and Family: More than three times a week    Frequency of Social Gatherings with Friends and Family: More than three times a week    Attends Religious Services: More than 4 times per year    Active Member of [employer] or Organizations: Yes    Attends Engineer, structural: More than 4 times per year    Marital Status: Divorced  Intimate Partner Violence: Not At Risk (09/25/2023)   Humiliation, Afraid, Rape, and Kick questionnaire    Fear of Current or Ex-Partner: No    Emotionally Abused: No    Physically Abused: No    Sexually Abused: No    Review of Systems: ROS negative except for what is noted on the assessment and plan.  Vitals:   01/01/24 0902  BP: 138/89  Pulse: 78  Temp: 98 F (36.7 C)  TempSrc: Oral  SpO2: 95%  Weight: (!) 329 lb (149.2 kg)  Height: 6' 2 (1.88 m)   Physical Exam: Constitutional: alert, sitting in wheelchair, in no acute distress Cardiovascular: regular rate and rhythm Pulmonary/Chest: normal work of breathing on room air, lungs clear to auscultation bilaterally Neurological: alert & oriented x 3 Skin: warm and dry  Assessment & Plan:   Assessment & Plan Viral  upper respiratory tract infection Acute visit. Endorses productive cough, dyspnea, nasal congestion. Denies fever or chills. Started about 2-3 weeks ago. Reports possible sick contacts with Covid. Tested at home and was negative. Taking Mucinex  with improvement. States symptoms stable/not worsening. Outside window for meds for Covid. Reports adherence to Trelegy. Vitals HDS, afebrile saturating 95% on RA. Cardiopulmonary exam stable today. Discussed symptoms to improve over course of weeks. Discussed return precautions.   Plan -Continue home supportive care including Mucinex  and Flonase   -Adequate hydration  -Return precautions discussed Prediabetes Patient requested repeat A1c today. Last A1c 5.8 on 10/03/23.  OSA (obstructive sleep apnea) Morbid obesity (HCC) Body mass index is 42.24 kg/m. Weight is down to 329 lbs from 336 lbs. On Wegovy  2.4 mg weekly and tolerating well. Given hx of OSA on CPAP, will transition and up titrate to Zepbound 7.5 mg weekly (Wegovy  2.4 mg about Zepbound 5 mg).   Plan -Transition and up titrate to Zepbound 7.5 mg weekly x 4 weeks then titrate up as tolerated  Heart failure with improved ejection fraction (HFimpEF) (HCC) HFimpEF (EF 55 to 60% on 03/2023). Echo notes possible Takotsubo cardiomyopathy at that time. GDMT: Losartan  25 mg, spironolactone  25 mg, metoprolol  25 mg daily, Jardiance  10 mg and torsemide  60 mg. Reports adherence. No crackles on lung exam today. Weight is down from prior visit. Requesting refill for spironolactone . Repeat BMP today.   Orders Placed This Encounter  Procedures   Hemoglobin A1c   Basic metabolic panel with GFR   Return in about 3 months (around 04/01/2024) for routine visit .   Patient discussed with Dr. CHARLENA Rosan Ozell Elicia, D.O. [facility] Internal Medicine, PGY-3 Phone: [phone] Date 01/01/2024 Time 7:28 PM

## 2024-01-02 ENCOUNTER — Ambulatory Visit: Payer: Self-pay | Admitting: Student

## 2024-01-02 LAB — HEMOGLOBIN A1C
Est. average glucose Bld gHb Est-mCnc: 123 mg/dL
Hgb A1c MFr Bld: 5.9 % — ABNORMAL HIGH (ref 4.8–5.6)

## 2024-01-02 LAB — BASIC METABOLIC PANEL WITH GFR
BUN/Creatinine Ratio: 13 (ref 12–28)
BUN: 13 mg/dL (ref 8–27)
CO2: 20 mmol/L (ref 20–29)
Calcium: 9.2 mg/dL (ref 8.7–10.3)
Chloride: 103 mmol/L (ref 96–106)
Creatinine, Ser: 0.98 mg/dL (ref 0.57–1.00)
Glucose: 87 mg/dL (ref 70–99)
Potassium: 4.4 mmol/L (ref 3.5–5.2)
Sodium: 140 mmol/L (ref 134–144)
eGFR: 61 mL/min/1.73 (ref >59)

## 2024-01-04 ENCOUNTER — Telehealth: Payer: Self-pay

## 2024-01-04 ENCOUNTER — Telehealth: Payer: Self-pay | Admitting: Student

## 2024-01-04 NOTE — Telephone Encounter (Signed)
 Prior authorization is still pending I will give the patient a call once I receive a approval or denial from her insurance.

## 2024-01-04 NOTE — Telephone Encounter (Signed)
 Received message from CRM regarding Zepbound.  Reason for CRM: Patient called in stating that she was in the office 2 days ago and saw Dr. Elicia. She states that he changed her from Semaglutide -Weight Management (WEGOVY ) 2.4 MG/0.75ML SOAJ to tirzepatide (ZEPBOUND) 7.5 MG/0.5ML Pen because of her shortness of breath. Patient is out of medication and hasn't heard anything. She stated that Dr. Elicia was suppose to check if the medication would be covered under her insurance. Patient states the pharmacy is stating one thing and the doctor is saying another. Please give her a call back to advise her of the status of the Zepbound that she's suppose to be taking. CB #: [phone].     I spoke with patient over the phone today.  Discussed that the prior authorization for Zepbound is pending and that our staff will check on status.  Discussed if Zepbound is approved then it will be available at her local pharmacy.  If not then we can discuss resuming her Wegovy .  Patient verbalizes understanding of plan and all questions addressed at this time.

## 2024-01-04 NOTE — Telephone Encounter (Signed)
 Prior Authorization for patient (Zepbound 7.5MG /0.5ML pen-injectors) came through on cover my meds was submitted with last office notes awaiting approval or denial.  KEY:[id number]

## 2024-01-07 NOTE — Telephone Encounter (Addendum)
 Patient Name: Francys Bolin Patient DOB: 06-Oct-1950 Patient ID: [id number] Status of Request: Deny Medication Name: Zepbound Inj 7.5/0.5 GPI/NDC: [id number] Decision Notes: ZEPBOUND INJ 7.5/0.5 is denied because it is not on your plan's Drug List (formulary). Medication authorization requires the following: (1) Your provider submits medical records (for example: chart notes) confirming moderate to severe obstructive sleep apnea [for example: 15 or more obstructive respiratory events (apnea-hypopnea index) per hour of sleep confirmed by a sleep study]. Reviewed by: R.Ph.  **Please note: A temporary transition supply (for up to one-month supply and within plan limits) may be filled at your local pharmacy if you are eligible for transition benefits. For more information on your Part D drug coverage, please refer to the Part D section of your Evidence of Coverage (EOC)  I will be appealing this request. Patient is aware of the denial. I will give the patient a call back once I receive a approval or denial from her insurance. Appeal information has been given to [facility] to fax.  [phone]

## 2024-01-09 NOTE — Progress Notes (Signed)
 Internal Medicine Clinic Attending  Case discussed with the resident at the time of the visit.  We reviewed the resident's history and exam and pertinent patient test results.  I agree with the assessment, diagnosis, and plan of care documented in the resident's note.

## 2024-01-09 NOTE — Telephone Encounter (Signed)
 Hey Hme have you received anything from her insurance?

## 2024-01-10 NOTE — Telephone Encounter (Signed)
 I called and spoke to [facility] with [employer]. Per Adolphus the appeal is still pending under review and decision will be made within 3-5 days. Patient is aware of the status of the appeal.   [employer] telephone: [phone]

## 2024-01-10 NOTE — Telephone Encounter (Signed)
 I have not received anything from her insurance.

## 2024-01-14 NOTE — Telephone Encounter (Addendum)
 I called and spoke to Aira R. With [employer]. Per Aira the appeal has been approved effective 01/04/2024-04/02/2025.  Case#: [id number]  I called the patient to let her know that this request has been approved. Unable to reach the patient, I lvm regarding the following information.

## 2024-01-21 ENCOUNTER — Other Ambulatory Visit: Payer: Self-pay | Admitting: Student

## 2024-01-21 ENCOUNTER — Telehealth: Payer: Self-pay | Admitting: *Deleted

## 2024-01-21 DIAGNOSIS — I5032 Chronic diastolic (congestive) heart failure: Secondary | ICD-10-CM

## 2024-01-21 NOTE — Telephone Encounter (Unsigned)
 Copied from CRM [id number]. Topic: Clinical - Order For Equipment >> Dec 21, 2023 10:28 AM Marda G wrote: Please call patient  regarding documents that need to be faxed. For the leg wrap  Chart info under Media:  documents 11/29/2023  13:50p  Refax to [phone]   Documents not received >> Jan 21, 2024  3:28 PM Miquel SAILOR wrote: Pt requesting update on Dme for her chair. No notes on file. She was irate but calmed her down. Called and transferred to [facility] >> Dec 24, 2023  2:25 PM Alfonso ORN wrote: Patient calling ask to speak with Arlean , she would understand the situation, patient call regarding a leg wrap that  >> Dec 21, 2023  3:25 PM Susanna ORN wrote: Patient calling to get an update and was told to call back today at [facility]. Office is closed. Please give patient a call back on Monday. States the paperwork was suppose to been faxed and no one has faxed it. CB #: [phone].

## 2024-01-28 DIAGNOSIS — I890 Lymphedema, not elsewhere classified: Secondary | ICD-10-CM | POA: Insufficient documentation

## 2024-02-05 ENCOUNTER — Ambulatory Visit: Admitting: Podiatry

## 2024-02-05 ENCOUNTER — Encounter: Payer: Self-pay | Admitting: Podiatry

## 2024-02-05 DIAGNOSIS — M79675 Pain in left toe(s): Secondary | ICD-10-CM | POA: Diagnosis not present

## 2024-02-05 DIAGNOSIS — E1142 Type 2 diabetes mellitus with diabetic polyneuropathy: Secondary | ICD-10-CM

## 2024-02-05 DIAGNOSIS — M79674 Pain in right toe(s): Secondary | ICD-10-CM | POA: Diagnosis not present

## 2024-02-05 DIAGNOSIS — R933 Abnormal findings on diagnostic imaging of other parts of digestive tract: Secondary | ICD-10-CM | POA: Insufficient documentation

## 2024-02-05 DIAGNOSIS — B351 Tinea unguium: Secondary | ICD-10-CM | POA: Diagnosis not present

## 2024-02-06 ENCOUNTER — Other Ambulatory Visit: Payer: Self-pay

## 2024-02-06 DIAGNOSIS — I480 Paroxysmal atrial fibrillation: Secondary | ICD-10-CM

## 2024-02-06 DIAGNOSIS — I739 Peripheral vascular disease, unspecified: Secondary | ICD-10-CM

## 2024-02-06 NOTE — Telephone Encounter (Signed)
 Medication sent to pharmacy

## 2024-02-10 NOTE — Progress Notes (Signed)
  Subjective:  Patient ID: Sharon Vasquez, female    DOB: 1950/12/26,  [name]: [id number]  Sharon Vasquez presents to clinic today for at risk foot care with history of diabetic neuropathy and painful mycotic toenails of both feet that are difficult to trim. Pain interferes with daily activities and wearing enclosed shoe gear comfortably.  Chief Complaint  Patient presents with   RFC    RFC  Prediabetic A1c 5.9 PCP Dr Bernadine Oct 2025   New problem(s): None.   PCP is Bernadine Manos, MD.  No Known Allergies  Review of Systems: Negative except as noted in the HPI.  Objective: No changes noted in today's physical examination. There were no vitals filed for this visit. Sharon Vasquez is a pleasant 73 y.o. female in NAD. AAO x 3.  Vascular Examination: Capillary refill time <3 seconds b/l. Palpable pedal pulses. Lymphedema b/l LE.  No pain with calf compression b/l. Skin temperature gradient WNL b/l. No cyanosis or clubbing b/l. No ischemia or gangrene noted b/l.   Neurological Examination: Pt has subjective symptoms of neuropathy. Protective sensation diminished with 10g monofilament b/l.  Dermatological Examination: Scarring consisitent with chronic lymphedema b/l. Dried heme on legs b/l. No signs of infection noted b/l. No interdigital macerations.   Toenails 1-5 b/l thick, discolored, elongated with subungual debris and pain on dorsal palpation.   Musculoskeletal Examination: Muscle strength 5/5 to all lower extremity muscle groups bilaterally. Pes planus No pain, crepitus or joint limitation noted with ROM b/l LE. . Mobility via motorized chair assistance.  Radiographs: None  Assessment/Plan: 1. Pain due to onychomycosis of toenails of both feet   2. Type 2 diabetes mellitus with peripheral neuropathy [facility])   Patient was evaluated and treated. All patient's and/or POA's questions/concerns addressed on today's visit. Mycotic toenails 1-5 b/l debrided in length and girth without  incident.  Continue daily foot inspections and monitor blood glucose per PCP/Endocrinologist's recommendations.Continue soft, supportive shoe gear daily. Report any pedal injuries to medical professional. Call office if there are any quesitons/concerns. -Patient/POA to call should there be question/concern in the interim.   Return in about 3 months (around 05/07/2024).  Delon LITTIE Merlin, DPM      [facility] LOCATION: [address] [place] [address]                   Office [phone]   [facility] LOCATION: [address] [place], [place] [address] Office [phone]

## 2024-02-14 ENCOUNTER — Telehealth: Payer: Self-pay | Admitting: *Deleted

## 2024-02-14 NOTE — Telephone Encounter (Signed)
 Patient's DME order form has been rec'd and placed in the PCP's box to complete and fax back.   Copied from CRM [id number]. Topic: General - Other >> Feb 12, 2024  3:36 PM Mercer PEDLAR wrote: Reason for CRM: Patient stated that she is waiting on PCP to sign request for wheelchair battery which was sent by [facility] [employer] on 02/07/24. Patient is frustrated and requesting a callback to discuss this matter.

## 2024-02-14 NOTE — Telephone Encounter (Signed)
 Will froward to C. [place].  Copied from CRM [id number]. Topic: General - Other >> Feb 12, 2024  3:36 PM Mercer PEDLAR wrote: Reason for CRM: Patient stated that she is waiting on PCP to sign request for wheelchair battery which was sent by [facility] [employer] on 02/07/24. Patient is frustrated and requesting a callback to discuss this matter. >> Feb 14, 2024  4:16 PM Fredrica W wrote: Patient called. States she is frustrated that she has not heard back and that [employer] has been trying to get order and has not received anything. Would like to know what's going on and if provider is refusing to complete. Let her know per notes: request received and has been sent to provider to complete. Patient also states she was referred to orthopedic surgeon who was not able to help and they sent her to physical therapy at [facility] [address]. They are saying she needs a garment that insurance will not cover and need PCP to assist with getting coverage. Thank You

## 2024-02-15 NOTE — Telephone Encounter (Signed)
 Copied from CRM [id number]. Topic: General - Other >> Feb 12, 2024  3:36 PM Mercer PEDLAR wrote: Reason for CRM: Patient stated that she is waiting on PCP to sign request for wheelchair battery which was sent by [facility] [employer] on 02/07/24. Patient is frustrated and requesting a callback to discuss this matter. >> Feb 15, 2024  9:33 AM Graeme ORN wrote: Received a call from Sarah with [employer]. States request been sent multiple times with no response. Advised received and sent to provider. Caller understood. Would like ETA or status update. Patient not mobile without chair. Thank You  >> Feb 14, 2024  4:16 PM Graeme ORN wrote: Patient called. States she is frustrated that she has not heard back and that [employer] has been trying to get order and has not received anything. Would like to know what's going on and if provider is refusing to complete. Let her know per notes: request received and has been sent to provider to complete. Patient also states she was referred to orthopedic surgeon who was not able to help and they sent her to physical therapy at [facility] [address]. They are saying she needs a garment that insurance will not cover and need PCP to assist with getting coverage. Thank You

## 2024-02-25 ENCOUNTER — Other Ambulatory Visit: Payer: Self-pay

## 2024-02-25 ENCOUNTER — Telehealth: Payer: Self-pay | Admitting: *Deleted

## 2024-02-25 DIAGNOSIS — I480 Paroxysmal atrial fibrillation: Secondary | ICD-10-CM

## 2024-02-25 DIAGNOSIS — I739 Peripheral vascular disease, unspecified: Secondary | ICD-10-CM

## 2024-02-25 NOTE — Telephone Encounter (Unsigned)
 Copied from CRM [id number]. Topic: General - Other >> Feb 12, 2024  3:36 PM Mercer PEDLAR wrote: Reason for CRM: Patient stated that she is waiting on PCP to sign request for wheelchair battery which was sent by [facility] [employer] on 02/07/24. Patient is frustrated and requesting a callback to discuss this matter. >> Feb 22, 2024  2:58 PM Mercer PEDLAR wrote: Camie calling from [facility] [employer] stating that they are still waiting on form to be completed and signed by provider. She stated that it was sent on 01/31/24 and again on 02/07/24.  Callback: [phone] >> Feb 15, 2024  9:33 AM Graeme ORN wrote: Received a call from Sarah with [employer]. States request been sent multiple times with no response. Advised received and sent to provider. Caller understood. Would like ETA or status update. Patient not mobile without chair. Thank You  >> Feb 14, 2024  4:16 PM Graeme ORN wrote: Patient called. States she is frustrated that she has not heard back and that [employer] has been trying to get order and has not received anything. Would like to know what's going on and if provider is refusing to complete. Let her know per notes: request received and has been sent to provider to complete. Patient also states she was referred to orthopedic surgeon who was not able to help and they sent her to physical therapy at [facility] [address]. They are saying she needs a garment that insurance will not cover and need PCP to assist with getting coverage. Thank You

## 2024-03-03 NOTE — Telephone Encounter (Signed)
 Patient's form was already completed and faxed backed prev.  Another new form has been signed again 02/20/2024 and has been faxed back again today.   Copied from CRM [id number]. Topic: General - Other >> Feb 12, 2024  3:36 PM Mercer PEDLAR wrote: Reason for CRM: Patient stated that she is waiting on PCP to sign request for wheelchair battery which was sent by [facility] [employer] on 02/07/24. Patient is frustrated and requesting a callback to discuss this matter. >> Feb 22, 2024  2:58 PM Mercer PEDLAR wrote: Camie calling from [facility] [employer] stating that they are still waiting on form to be completed and signed by provider. She stated that it was sent on 01/31/24 and again on 02/07/24.  Callback: [phone] >> Feb 15, 2024  9:33 AM Graeme ORN wrote: Received a call from Sarah with [employer]. States request been sent multiple times with no response. Advised received and sent to provider. Caller understood. Would like ETA or status update. Patient not mobile without chair. Thank You  >> Feb 14, 2024  4:16 PM Graeme ORN wrote: Patient called. States she is frustrated that she has not heard back and that [employer] has been trying to get order and has not received anything. Would like to know what's going on and if provider is refusing to complete. Let her know per notes: request received and has been sent to provider to complete. Patient also states she was referred to orthopedic surgeon who was not able to help and they sent her to physical therapy at [facility] [address]. They are saying she needs a garment that insurance will not cover and need PCP to assist with getting coverage. Thank You

## 2024-03-07 ENCOUNTER — Other Ambulatory Visit: Payer: Self-pay | Admitting: Internal Medicine

## 2024-03-07 DIAGNOSIS — Z853 Personal history of malignant neoplasm of breast: Secondary | ICD-10-CM

## 2024-03-07 DIAGNOSIS — Z1231 Encounter for screening mammogram for malignant neoplasm of breast: Secondary | ICD-10-CM

## 2024-03-07 DIAGNOSIS — Z9889 Other specified postprocedural states: Secondary | ICD-10-CM

## 2024-03-12 ENCOUNTER — Encounter

## 2024-03-12 ENCOUNTER — Inpatient Hospital Stay: Admission: RE | Admit: 2024-03-12 | Discharge: 2024-03-12 | Attending: Internal Medicine | Admitting: Internal Medicine

## 2024-03-12 DIAGNOSIS — Z853 Personal history of malignant neoplasm of breast: Secondary | ICD-10-CM

## 2024-03-12 DIAGNOSIS — Z9889 Other specified postprocedural states: Secondary | ICD-10-CM

## 2024-03-17 ENCOUNTER — Ambulatory Visit: Admitting: Primary Care

## 2024-03-17 ENCOUNTER — Other Ambulatory Visit: Payer: Self-pay

## 2024-03-17 ENCOUNTER — Emergency Department (HOSPITAL_COMMUNITY)
Admission: EM | Admit: 2024-03-17 | Discharge: 2024-03-17 | Disposition: A | Discharge status: Home / Self Care | Attending: Emergency Medicine | Admitting: Emergency Medicine

## 2024-03-17 DIAGNOSIS — Z79899 Other long term (current) drug therapy: Secondary | ICD-10-CM | POA: Diagnosis not present

## 2024-03-17 DIAGNOSIS — T24212A Burn of second degree of left thigh, initial encounter: Secondary | ICD-10-CM | POA: Diagnosis not present

## 2024-03-17 DIAGNOSIS — T24232A Burn of second degree of left lower leg, initial encounter: Secondary | ICD-10-CM | POA: Diagnosis not present

## 2024-03-17 DIAGNOSIS — T310 Burns involving less than 10% of body surface: Secondary | ICD-10-CM | POA: Diagnosis not present

## 2024-03-17 DIAGNOSIS — T24202A Burn of second degree of unspecified site of left lower limb, except ankle and foot, initial encounter: Secondary | ICD-10-CM

## 2024-03-17 DIAGNOSIS — Y93G3 Activity, cooking and baking: Secondary | ICD-10-CM | POA: Diagnosis not present

## 2024-03-17 DIAGNOSIS — Z7901 Long term (current) use of anticoagulants: Secondary | ICD-10-CM | POA: Diagnosis not present

## 2024-03-17 DIAGNOSIS — X102XXA Contact with fats and cooking oils, initial encounter: Secondary | ICD-10-CM | POA: Diagnosis not present

## 2024-03-17 DIAGNOSIS — T24012A Burn of unspecified degree of left thigh, initial encounter: Secondary | ICD-10-CM | POA: Diagnosis present

## 2024-03-17 MED ORDER — BACITRACIN ZINC 500 UNIT/GM EX OINT
TOPICAL_OINTMENT | CUTANEOUS | Status: AC
  Filled 2024-03-17: qty 2.7

## 2024-03-17 MED ORDER — AMOXICILLIN-POT CLAVULANATE 875-125 MG PO TABS: 1.0000 | ORAL_TABLET | Freq: Two times a day (BID) | ORAL | 0 refills | Status: AC

## 2024-03-17 MED ORDER — HYDROMORPHONE HCL 1 MG/ML IJ SOLN
1.0000 mg | Freq: Once | INTRAMUSCULAR | Status: AC
  Administered 2024-03-17: 20:00:00 1 mg via INTRAVENOUS
  Filled 2024-03-17: qty 1

## 2024-03-17 NOTE — ED Triage Notes (Signed)
 Reports taking xarelto  for hx of pe, no sign of bleeding
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
## 2024-03-18 ENCOUNTER — Telehealth (HOSPITAL_COMMUNITY): Payer: Self-pay | Admitting: Emergency Medicine

## 2024-03-19 ENCOUNTER — Ambulatory Visit: Payer: Self-pay

## 2024-03-19 DIAGNOSIS — T24201A Burn of second degree of unspecified site of right lower limb, except ankle and foot, initial encounter: Secondary | ICD-10-CM
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
## 2024-03-20 MED ORDER — OXYCODONE HCL 5 MG PO TABS: 5.0000 mg | ORAL_TABLET | Freq: Three times a day (TID) | ORAL | 0 refills | Status: AC | PRN

## 2024-03-21 ENCOUNTER — Ambulatory Visit: Payer: Self-pay
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
## 2024-03-24 ENCOUNTER — Other Ambulatory Visit: Payer: Self-pay

## 2024-03-24 ENCOUNTER — Encounter: Payer: Self-pay | Admitting: Student

## 2024-03-24 ENCOUNTER — Ambulatory Visit: Admitting: Student

## 2024-03-24 VITALS — BP 118/82 | HR 88 | Temp 98.4°F

## 2024-03-24 DIAGNOSIS — T24232D Burn of second degree of left lower leg, subsequent encounter: Secondary | ICD-10-CM
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
## 2024-03-24 NOTE — Telephone Encounter (Signed)
 Oxycodone  rx was written on 12/18 but as Print; needs to be re-send as Electronically. Thanks

## 2024-03-24 NOTE — Addendum Note (Signed)
 Addended by: CAMMIE GAETANA DEL on: 03/24/2024 01:38 PM   Modules accepted: Orders

## 2024-03-25 ENCOUNTER — Telehealth: Payer: Self-pay

## 2024-03-25 NOTE — Telephone Encounter (Signed)
 I contacted pt to let her know that she will need to come to the office to signed release of information for her medical records. Pt states she is requesting her medical records to be sent to [facility]. Advised pt once she signed ROI form I can faxed it for her. Pt states she will tried to come by the office today and signed the ROI. Advised pt to call back if she needs further assistance.

## 2024-03-25 NOTE — Telephone Encounter (Unsigned)
 Copied from CRM [id number]. Topic: General - Other >> Mar 25, 2024  9:02 AM Marda MATSU wrote: Reason for CRM: connected patient to Mae in the office >> Mar 25, 2024  2:23 PM Fredrica W wrote: Received a call from [facility] for [facility]. States patient daughter is trying to get her placed for short term rehab. They have the clinical notes needed but are requesting FL2 for be Sent to [facility] via fax to assist with case and placemen. Fax: [phone] and Phone [phone]. Thank You  >> Mar 25, 2024 12:44 PM DeAngela L wrote: Patient returning a call for Mae in the office about the nursing home FL2 forms that were going to be faxed  [phone] fax number  [phone] phone number  Pt num [phone]

## 2024-03-25 NOTE — Telephone Encounter (Signed)
 I called and spoke with Sharon Vasquez regarding fl2 form. Advised pt I will give Dr. Norrine the fl2 form for him to complete. Once is completed I will faxed and call pt back. Advised pt to call back if she needs further assistance.

## 2024-03-26 NOTE — Telephone Encounter (Signed)
 Pt was seen on 03/24/24.

## 2024-03-26 NOTE — Telephone Encounter (Addendum)
 I contacted pt this morning to let her know that her FL2 form has been completed and faxed to [facility], confirmation went through. The original copy will be at the front dest for pt to pick up. Advised pt to call back if she needs further assistance.

## 2024-03-31 ENCOUNTER — Telehealth: Admitting: Student

## 2024-03-31 ENCOUNTER — Telehealth: Payer: Self-pay | Admitting: *Deleted

## 2024-03-31 ENCOUNTER — Telehealth: Payer: Self-pay

## 2024-03-31 DIAGNOSIS — Z789 Other specified health status: Secondary | ICD-10-CM | POA: Diagnosis not present

## 2024-03-31 DIAGNOSIS — T24232D Burn of second degree of left lower leg, subsequent encounter: Secondary | ICD-10-CM

## 2024-03-31 NOTE — Progress Notes (Unsigned)
 Complex Care Management Note Care Guide Note  03/31/2024 Name: Sharon Vasquez [name]: [id number] DOB: [birth year]   Complex Care Management Outreach Attempts: An unsuccessful telephone outreach was attempted today to offer the patient information about available complex care management services.  Follow Up Plan:  Additional outreach attempts will be made to offer the patient complex care management information and services.   Encounter Outcome:  No Answer-Left voicemail  Leotis Rase [facility], [facility] Guide  Direct Dial: [phone]  Fax [phone]

## 2024-03-31 NOTE — Telephone Encounter (Signed)
 Call from patient states that she is trying to get into a Facility. She needs for the doctor here to do a Peer to Peer with a physician  here for her to be admitted.  Message was sent to C. Shirlean who will call patient to discuss what is needed so that she can be admitted to the Facility.

## 2024-03-31 NOTE — Telephone Encounter (Signed)
 Will forward to Attending Pool as Dr. Norrine is not available .               Copied from CRM #[id number]. Topic: General - Other >> Mar 31, 2024  7:49 AM Cherylann RAMAN wrote: Reason for CRM: Andrez Ditch, daughter, called and stated the provider has to do a peer to appear. Peer to Appear needs to be completed by patient's MD. Provider please contact [phone] option 5. Deadline is 12/29 at noon. Member Id, Insurance name, Patient's name, and Patient's DOB would need to be provided for the patient. >> Mar 31, 2024 10:12 AM Chiquita SQUIBB wrote: Patient calling in again, transferred to [facility] >> Mar 31, 2024 10:05 AM DeAngela L wrote: Patient states she was disconnected and could not answer when the call came back in on her phone Transferring back to [employer]  >> Mar 31, 2024  9:30 AM Suzette B wrote: After being advised of the normal call back times, pt returned call stating that she had not received a call back since she call this morning, and paperwork needed to be done by 11am no later than 12pm I called [facility] spoke with Ms. Treshetta whom stated that Ms. Cme would be out the remainder of the week, but was able to transfer the pt over for assistance.

## 2024-03-31 NOTE — Assessment & Plan Note (Signed)
 Please review above
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
## 2024-03-31 NOTE — Telephone Encounter (Signed)
 Will forward to C. Boone .                  Copied from CRM [id number]. Topic: General - Other >> Mar 25, 2024  9:02 AM Carrielelia G wrote: Reason for CRM: connected patient to Mae in the office >> Mar 31, 2024  8:00 AM Carrielelia G wrote: Attn: Mae or Provider  Please call patient Esquivias regarding admission to Rehab facility, she needs a call before 11am today to know her status because the insurance company is calling and asking.  >> Mar 25, 2024  2:23 PM Fredrica W wrote: Received a call from [facility] for [facility]. States patient daughter is trying to get her placed for short term rehab. They have the clinical notes needed but are requesting FL2 for be Sent to [facility] via fax to assist with case and placemen. Fax: [phone] and Phone [phone]. Thank You  >> Mar 25, 2024 12:44 PM DeAngela L wrote: Patient returning a call for Mae in the office about the nursing home FL2 forms that were going to be faxed  [phone] fax number  [phone] phone number  Pt num [phone]

## 2024-03-31 NOTE — Progress Notes (Unsigned)
 Complex Care Management Note Care Guide Note  03/31/2024 Name: Sharon Vasquez [name]: [id number] DOB: 1951/01/15   Complex Care Management Outreach Attempts: A second unsuccessful outreach was attempted today to offer the patient with information about available complex care management services.  Follow Up Plan:  Additional outreach attempts will be made to offer the patient complex care management information and services.   Encounter Outcome:  Patient Refused  Leotis Rase [facility], [facility] Guide  Direct Dial: [phone]  Fax [phone]

## 2024-04-01 ENCOUNTER — Other Ambulatory Visit: Payer: Self-pay | Admitting: Student

## 2024-04-01 ENCOUNTER — Ambulatory Visit: Payer: Self-pay

## 2024-04-01 MED ORDER — SILVER SULFADIAZINE 1 % EX CREA: TOPICAL_CREAM | CUTANEOUS | 1 refills | Status: AC

## 2024-04-01 MED ORDER — SILVER SULFADIAZINE 1 % EX CREA: TOPICAL_CREAM | CUTANEOUS | 1 refills | Status: DC
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
## 2024-04-01 NOTE — Progress Notes (Signed)
 Internal Medicine Clinic Attending  Case discussed with the resident at the time of the visit.  We reviewed the resident's history and exam and pertinent patient test results.  I agree with the assessment, diagnosis, and plan of care documented in the resident's note.

## 2024-04-01 NOTE — Telephone Encounter (Signed)
 Unable to reach patient per phone.  Called daughter who states that the patient has supplies for the wound.  Patient only needs Silverdene Creme prescription sent to the Pharmacy.

## 2024-04-04 ENCOUNTER — Other Ambulatory Visit: Payer: Self-pay

## 2024-04-04 ENCOUNTER — Emergency Department (HOSPITAL_COMMUNITY)

## 2024-04-04 ENCOUNTER — Emergency Department (HOSPITAL_COMMUNITY): Admission: EM | Admit: 2024-04-04 | Discharge: 2024-04-08 | Disposition: A

## 2024-04-04 ENCOUNTER — Encounter (HOSPITAL_COMMUNITY): Payer: Self-pay

## 2024-04-04 DIAGNOSIS — T24232D Burn of second degree of left lower leg, subsequent encounter: Secondary | ICD-10-CM | POA: Insufficient documentation

## 2024-04-04 DIAGNOSIS — X118XXD Contact with other hot tap-water, subsequent encounter: Secondary | ICD-10-CM | POA: Diagnosis not present

## 2024-04-04 DIAGNOSIS — T24212D Burn of second degree of left thigh, subsequent encounter: Secondary | ICD-10-CM | POA: Diagnosis not present

## 2024-04-04 DIAGNOSIS — Z7901 Long term (current) use of anticoagulants: Secondary | ICD-10-CM | POA: Insufficient documentation

## 2024-04-04 DIAGNOSIS — T3111 Burns involving 10-19% of body surface with 10-19% third degree burns: Secondary | ICD-10-CM | POA: Diagnosis not present

## 2024-04-04 LAB — BASIC METABOLIC PANEL WITH GFR
Anion gap: 11 (ref 5–15)
BUN: 10 mg/dL (ref 8–23)
CO2: 26 mmol/L (ref 22–32)
Calcium: 9.2 mg/dL (ref 8.9–10.3)
Chloride: 103 mmol/L (ref 98–111)
Creatinine, Ser: 0.91 mg/dL (ref 0.44–1.00)
GFR, Estimated: >60 mL/min
Glucose, Bld: 82 mg/dL (ref 70–99)
Potassium: 4.1 mmol/L (ref 3.5–5.1)
Sodium: 139 mmol/L (ref 135–145)

## 2024-04-04 LAB — CBC WITH DIFFERENTIAL/PLATELET
Abs Immature Granulocytes: 0.01 K/uL (ref 0.00–0.07)
Basophils Absolute: 0 K/uL (ref 0.0–0.1)
Basophils Relative: 0 %
Eosinophils Absolute: 0.1 K/uL (ref 0.0–0.5)
Eosinophils Relative: 1 %
HCT: 40 % (ref 36.0–46.0)
Hemoglobin: 13.2 g/dL (ref 12.0–15.0)
Immature Granulocytes: 0 %
Lymphocytes Relative: 37 %
Lymphs Abs: 2.3 K/uL (ref 0.7–4.0)
MCH: 28.6 pg (ref 26.0–34.0)
MCHC: 33 g/dL (ref 30.0–36.0)
MCV: 86.8 fL (ref 80.0–100.0)
Monocytes Absolute: 0.5 K/uL (ref 0.1–1.0)
Monocytes Relative: 9 %
Neutro Abs: 3.3 K/uL (ref 1.7–7.7)
Neutrophils Relative %: 53 %
Platelets: 523 K/uL — ABNORMAL HIGH (ref 150–400)
RBC: 4.61 MIL/uL (ref 3.87–5.11)
RDW: 13.4 % (ref 11.5–15.5)
WBC: 6.2 K/uL (ref 4.0–10.5)
nRBC: 0 % (ref 0.0–0.2)

## 2024-04-04 MED ORDER — FLUTICASONE PROPIONATE 50 MCG/ACT NA SUSP
1.0000 | Freq: Every day | NASAL | Status: DC
  Administered 2024-04-04: 1 via NASAL
  Filled 2024-04-04 (×2): qty 16

## 2024-04-04 MED ORDER — EMPAGLIFLOZIN 10 MG PO TABS
10.0000 mg | ORAL_TABLET | Freq: Every day | ORAL | Status: DC
  Administered 2024-04-05 – 2024-04-08 (×4): 10 mg via ORAL
  Filled 2024-04-04 (×5): qty 1

## 2024-04-04 MED ORDER — RIVAROXABAN 20 MG PO TABS
20.0000 mg | ORAL_TABLET | Freq: Every day | ORAL | Status: DC
  Administered 2024-04-05 – 2024-04-07 (×3): 20 mg via ORAL
  Filled 2024-04-04 (×3): qty 1
  Filled 2024-04-04 (×2): qty 2

## 2024-04-04 MED ORDER — SILVER SULFADIAZINE 1 % EX CREA
TOPICAL_CREAM | Freq: Two times a day (BID) | CUTANEOUS | Status: DC
  Filled 2024-04-04 (×5): qty 85

## 2024-04-04 MED ORDER — LORAZEPAM 0.5 MG PO TABS: 0.5000 mg | ORAL_TABLET | Freq: Every day | ORAL | Status: DC | PRN

## 2024-04-04 MED ORDER — METOPROLOL SUCCINATE ER 25 MG PO TB24
25.0000 mg | ORAL_TABLET | Freq: Every day | ORAL | Status: DC
  Administered 2024-04-05 – 2024-04-08 (×4): 25 mg via ORAL
  Filled 2024-04-04 (×5): qty 1

## 2024-04-04 MED ORDER — ALBUTEROL SULFATE HFA 108 (90 BASE) MCG/ACT IN AERS: 2.0000 | INHALATION_SPRAY | RESPIRATORY_TRACT | Status: DC | PRN

## 2024-04-04 MED ORDER — OXYBUTYNIN CHLORIDE 5 MG PO TABS
5.0000 mg | ORAL_TABLET | Freq: Three times a day (TID) | ORAL | Status: DC
  Administered 2024-04-04 – 2024-04-08 (×12): 5 mg via ORAL
  Filled 2024-04-04 (×13): qty 1

## 2024-04-04 MED ORDER — SPIRONOLACTONE 25 MG PO TABS
25.0000 mg | ORAL_TABLET | Freq: Every day | ORAL | Status: DC
  Administered 2024-04-05 – 2024-04-08 (×4): 25 mg via ORAL
  Filled 2024-04-04 (×5): qty 1

## 2024-04-04 MED ORDER — BUDESON-GLYCOPYRROL-FORMOTEROL 160-9-4.8 MCG/ACT IN AERO
2.0000 | INHALATION_SPRAY | Freq: Two times a day (BID) | RESPIRATORY_TRACT | Status: DC
  Administered 2024-04-06 (×2): 2 via RESPIRATORY_TRACT
  Filled 2024-04-04 (×3): qty 5.9

## 2024-04-04 MED ORDER — HYDROCODONE-ACETAMINOPHEN 5-325 MG PO TABS
2.0000 | ORAL_TABLET | Freq: Once | ORAL | Status: AC
  Administered 2024-04-04: 2 via ORAL
  Filled 2024-04-04: qty 2

## 2024-04-04 MED ORDER — POTASSIUM CHLORIDE CRYS ER 20 MEQ PO TBCR
20.0000 meq | EXTENDED_RELEASE_TABLET | Freq: Every day | ORAL | Status: DC
  Administered 2024-04-05 – 2024-04-08 (×4): 20 meq via ORAL
  Filled 2024-04-04 (×5): qty 1

## 2024-04-04 MED ORDER — LETROZOLE 2.5 MG PO TABS
2.5000 mg | ORAL_TABLET | Freq: Every day | ORAL | Status: DC
  Administered 2024-04-05 – 2024-04-07 (×3): 2.5 mg via ORAL
  Filled 2024-04-04 (×7): qty 1

## 2024-04-04 MED ORDER — LOSARTAN POTASSIUM 50 MG PO TABS
25.0000 mg | ORAL_TABLET | Freq: Every day | ORAL | Status: DC
  Administered 2024-04-05 – 2024-04-08 (×4): 25 mg via ORAL
  Filled 2024-04-04 (×5): qty 1

## 2024-04-04 MED ORDER — ATORVASTATIN CALCIUM 80 MG PO TABS
80.0000 mg | ORAL_TABLET | Freq: Every day | ORAL | Status: DC
  Administered 2024-04-05 – 2024-04-08 (×4): 80 mg via ORAL
  Filled 2024-04-04: qty 1
  Filled 2024-04-04 (×2): qty 2
  Filled 2024-04-04: qty 1
  Filled 2024-04-04: qty 2

## 2024-04-04 MED ORDER — PANTOPRAZOLE SODIUM 40 MG PO TBEC
40.0000 mg | DELAYED_RELEASE_TABLET | Freq: Every day | ORAL | Status: DC
  Administered 2024-04-04 – 2024-04-08 (×5): 40 mg via ORAL
  Filled 2024-04-04 (×5): qty 1

## 2024-04-04 MED ORDER — TORSEMIDE 20 MG PO TABS
60.0000 mg | ORAL_TABLET | Freq: Every day | ORAL | Status: DC
  Administered 2024-04-04 – 2024-04-07 (×4): 60 mg via ORAL
  Filled 2024-04-04 (×5): qty 3

## 2024-04-04 MED ORDER — GABAPENTIN 400 MG PO CAPS
400.0000 mg | ORAL_CAPSULE | Freq: Two times a day (BID) | ORAL | Status: DC
  Administered 2024-04-04 – 2024-04-08 (×9): 400 mg via ORAL
  Filled 2024-04-04: qty 1
  Filled 2024-04-04: qty 4
  Filled 2024-04-04: qty 1
  Filled 2024-04-04: qty 4
  Filled 2024-04-04 (×4): qty 1
  Filled 2024-04-04: qty 4
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
## 2024-04-04 NOTE — Progress Notes (Signed)
 Physical Therapy Quick Note  PT has completed initial evaluation.    Overall, patient at min assistance level.   PT Follow up recommended: Inpatient follow up therapy, < 3 hours/day Equipment recommended:  None recommended Complete evaluation note to follow.     Rexie Catena, PT, DPT Acute Rehabilitation Office [phone]

## 2024-04-04 NOTE — ED Notes (Signed)
 Attempted to do wound care however pt reports it was just done around 6 and did not need to be done again.  Dressing is clean and intact
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
## 2024-04-04 NOTE — ED Notes (Signed)
 Holding silvadene  until pt is closer to DC so that we can apply and wrap after all assessments have been done and we know DC is imminent.
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
## 2024-04-04 NOTE — ED Notes (Signed)
 Turkey sandwich provided and ginger ale provided.

## 2024-04-04 NOTE — Progress Notes (Signed)
 Per Tammy (admissions @ [facility]), bed available for patient on Sunday. Auth remains pending. Discussed with patient at bedside, she is aware and agreeable. Tammy provided # for admissions staff on call [place], [phone]). TOC following. Anticipate discharge on Sunday.
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
## 2024-04-04 NOTE — Progress Notes (Signed)
 SNF ref faxed, bed offers presented and bed choice is [facility]. Ins auth started, pending approval.

## 2024-04-05 MED ORDER — OXYCODONE HCL 5 MG PO TABS
15.0000 mg | ORAL_TABLET | Freq: Four times a day (QID) | ORAL | Status: DC | PRN
  Administered 2024-04-05 – 2024-04-08 (×6): 15 mg via ORAL
  Filled 2024-04-05 (×8): qty 3

## 2024-04-05 MED ORDER — ACETAMINOPHEN 325 MG PO TABS
650.0000 mg | ORAL_TABLET | Freq: Once | ORAL | Status: AC
  Administered 2024-04-05: 650 mg via ORAL
  Filled 2024-04-05: qty 2

## 2024-04-05 NOTE — ED Notes (Signed)
 Attempted to complete wound care on patient's left leg, patient declines stating it looks and feels okay. Dressing clean, dry and intact at this time.
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
## 2024-04-06 NOTE — Progress Notes (Signed)
 CSW has checked INS AUTH through out shift as of 2:16 PM INS is still pending. ICM will continue to follow.    Tunisia Gaither Biehn LCSW  04/06/2024 2:16 PM

## 2024-04-06 NOTE — ED Notes (Signed)
 PT is lying down at this time

## 2024-04-06 NOTE — ED Notes (Signed)
 PT is watching tv and playing on her phone

## 2024-04-06 NOTE — ED Notes (Addendum)
 Undressed pt's wound to apply silvadene . Applied new xero and gauze wrapping.

## 2024-04-07 ENCOUNTER — Telehealth: Payer: Self-pay | Admitting: *Deleted

## 2024-04-07 DIAGNOSIS — K2100 Gastro-esophageal reflux disease with esophagitis, without bleeding: Secondary | ICD-10-CM

## 2024-04-07 NOTE — ED Notes (Signed)
 Pt reports 10/10 in her legs at this time, oxy administered per MD order. Pt offered assistance with ADL's and AM cares. Pt currently refusing, states her daughter will be in to assist her. Pt denies any further needs at this time.

## 2024-04-07 NOTE — ED Notes (Signed)
 Wounds cleansed, silvadene  and xeroform applied, wrapped in kerlex w/ tape

## 2024-04-07 NOTE — Progress Notes (Signed)
 Ins auth pending.

## 2024-04-07 NOTE — Telephone Encounter (Signed)
 Spoke with patient's daughter as we were unable to contact patient.  Plans to take patient to the ER after the first of the year to see if she can be  admitted to a Facility from the ER or after Admission to the hospital.                                              Copied from CRM [id number]. Topic: General - Other >> Mar 31, 2024 12:20 PM Shamecia H wrote: Reason for CRM: Patient was just calling to let Kenneth know that what she told her to do its done. Patients wouldn't tell me what it was.

## 2024-04-07 NOTE — Progress Notes (Addendum)
 CSW uploaded ED Provider note/MD progress notes as requested by Navi. Shara is still pending.

## 2024-04-07 NOTE — Telephone Encounter (Signed)
 Pt is Requesting a new Referral to GI.  Please advised if a new Referral can be placed for this patient.  Pt  has seen [facility] in the past.  Copied from CRM #[id number]. Topic: Referral - Request for Referral >> Apr 07, 2024 10:32 AM [name] wrote: Did the patient discuss referral with their provider in the last year? Yes  Appointment offered? No  Type of order/referral and detailed reason for visit: patient stated she needed a referral for gastrologist   Preference of office, provider, location: Unknown  If referral order, have you been seen by this specialty before? No (If Yes, this issue or another issue? When? Where?  Can we respond through MyChart? Yes

## 2024-04-07 NOTE — ED Notes (Signed)
 Patient is watching tv. She ask for crackers and soda.

## 2024-04-07 NOTE — ED Notes (Signed)
 Patient has a purick that was full when I emptied it. Gave her a snack and drink

## 2024-04-07 NOTE — Telephone Encounter (Signed)
 Copied from CRM #[id number]. Topic: Clinical - Medical Advice >> Apr 07, 2024 10:27 AM [name] wrote: Reason for CRM: currently in hosptial; pt being held room 48 on psych ward because there's no available rooms to have the pt place. Pt states her potassium is low, the food she's eating is not digesting, she stated she's upset because no one has checked on her she needs to speak with someone about her stomach

## 2024-04-07 NOTE — Telephone Encounter (Signed)
 Called pt - informed pt since she's in the ER,to inform the nurse/doctor about her stomach.

## 2024-04-07 NOTE — ED Provider Notes (Signed)
 Emergency Medicine Observation Re-evaluation Note  Sharon Vasquez is a 74 y.o. female, seen on rounds today.  Pt initially presented to the ED for complaints of Wound Check and Leg Pain Currently, the patient is resting comfortably.  Physical Exam  BP 126/71   Pulse 88   Temp 98.6 F (37 C) (Oral)   Resp 19   Ht 1.88 m (6' 2)   Wt (!) 146.5 kg   SpO2 97%   BMI 41.47 kg/m  Physical Exam General: No distress Cardiac: Normal heart rate Lungs: No increased work of breathing Psych: Calm and comfortable  ED Course / MDM  EKG:   I have reviewed the labs performed to date as well as medications administered while in observation.  Recent changes in the last 24 hours include no changes.  Plan  Current plan is for placement by TOC.    Cleotilde Rogue, MD 04/07/24 [phone]

## 2024-04-07 NOTE — Progress Notes (Signed)
 Auth approved per [facility] Portal. Plan Auth ID: [id number], Auth ID: [id number]. Incorrect date range noted, 1/17-1/19. Messaged Care Coordinator via portal to request correct SOC, 1/6. Correction pending. Per Tammy in admissions, [facility] able to accept tomorrow. Updated patient at bedside.

## 2024-04-07 NOTE — Progress Notes (Signed)
 Internal Medicine Clinic Attending  Case discussed with the resident at the time of the visit.  We reviewed the resident's history and exam and pertinent patient test results.  I agree with the assessment, diagnosis, and plan of care documented in the resident's note.

## 2024-04-08 MED ORDER — LORAZEPAM 1 MG PO TABS: 0.5000 mg | ORAL_TABLET | Freq: Two times a day (BID) | ORAL | 0 refills | Status: AC | PRN

## 2024-04-08 MED ORDER — GABAPENTIN 400 MG PO CAPS: 400.0000 mg | ORAL_CAPSULE | Freq: Two times a day (BID) | ORAL | 0 refills | Status: AC

## 2024-04-08 MED ORDER — OXYCODONE HCL 15 MG PO TABS: 15.0000 mg | ORAL_TABLET | ORAL | 0 refills | Status: AC | PRN

## 2024-04-08 NOTE — Progress Notes (Signed)
 SNF bed ready at [facility]. Call report [phone] room [phone].

## 2024-04-08 NOTE — ED Notes (Signed)
PTAR called no ETA given ?

## 2024-04-08 NOTE — Addendum Note (Signed)
 Addended by: Jaclin Finks on: 04/08/2024 11:54 AM   Modules accepted: Orders

## 2024-04-08 NOTE — Discharge Instructions (Addendum)
 Temporary wound care instructions (patient will benefit from repeat wound care assessment at SNF).  Daily:  Cleanse with normal saline and gauze, pat dry gently, apply a thin layer of Silvadene  cream to all open sites, then cover with Xeroform #294, wrap with 2 Kerlix rolled gauze, secure in place with wide tape. Contact provider if wound worsens in condition. Off-load heels at all times onto pillows.    A 3 day prescription printed for narcotic medications was provided from the ED for the patient's chronic narcotic medications.  Further refills/prescriptions should be provided by facility provider.

## 2024-04-08 NOTE — ED Provider Notes (Signed)
 Emergency Medicine Observation Re-evaluation Note  Sharon Vasquez is a 74 y.o. female, seen on rounds today.  Pt initially presented to the ED for complaints of Wound Check and Leg Pain Currently, the patient is resting  Physical Exam  BP 103/62 (BP Location: Left Arm)   Pulse 64   Temp 98.4 F (36.9 C) (Oral)   Resp 16   Ht 6' 2 (1.88 m)   Wt (!) 146.5 kg   SpO2 92%   BMI 41.47 kg/m  Physical Exam General: NAD Cardiac: Regular HR Lungs: No resp distress Psych: Stable  ED Course / MDM  EKG:   I have reviewed the labs performed to date as well as medications administered while in observation.    Plan  Current plan is for [facility] consult.    Cottie Donnice PARAS, MD 04/08/24 [phone]

## 2024-04-14 ENCOUNTER — Other Ambulatory Visit: Payer: Self-pay | Admitting: Student

## 2024-04-14 DIAGNOSIS — G4733 Obstructive sleep apnea (adult) (pediatric): Secondary | ICD-10-CM

## 2024-05-09 ENCOUNTER — Ambulatory Visit: Payer: Self-pay

## 2024-05-09 NOTE — Telephone Encounter (Signed)
 Duplicate call.

## 2024-05-09 NOTE — Telephone Encounter (Signed)
 FYI Only or Action Required?: Action required by provider: request for appointment.  Patient was last seen in primary care on 03/31/2024 by Heddy Barren, DO.  Called Nurse Triage reporting Leg Swelling and Blister.  Symptoms began several days ago.  Interventions attempted: Prescription medications: Oxycodone .  Symptoms are: gradually worsening.  Triage Disposition: See Physician Within 24 Hours  Patient/caregiver understands and will follow disposition?: No                     Reason for Disposition  [1] MODERATE leg swelling (e.g., swelling extends up to knees) AND [2] new-onset or getting worse  Answer Assessment - Initial Assessment Questions Patient requesting 3:45pm 05/14/24 office visit. Won't go to ED or UC today due to no transportation. RN advised to be seen within 24 hours due to hx of CHF with weight gain, leg swelling. Patient declined. Also requesting higher dose of Zepbound .   1. ONSET: When did the swelling start? (e.g., minutes, hours, days)     05/06/24  2. LOCATION: What part of the leg is swollen?  Are both legs swollen or just one leg?     Bilateral legs  3. SEVERITY: How bad is the swelling? (e.g., localized; mild, moderate, severe)     Moderate, swelling up to knees  4. REDNESS: Is there redness or signs of infection?     No.  5. PAIN: Is the swelling painful to touch? If Yes, ask: How painful is it?   (Scale 1-10; mild, moderate or severe)     8.5/10. Bilateral knee pain   6. FEVER: Do you have a fever? If Yes, ask: What is it, how was it measured, and when did it start?      No.  7. CAUSE: What do you think is causing the leg swelling?     Unsure.  8. MEDICAL HISTORY: Do you have a history of blood clots (e.g., DVT), cancer, heart failure, kidney disease, or liver failure?     CHF  9. RECURRENT SYMPTOM: Have you had leg swelling before? If Yes, ask: When was the last time? What happened that  time?     Yes, history of leg swelling. Would have her legs wrapped in office.  10. OTHER SYMPTOMS: Do you have any other symptoms? (e.g., chest pain, difficulty breathing)       Penny sized blister x1 to left lower leg  started today, weight gain (patient did not specify amount), SpO2: 93% on room air.  No difficulty breathing or SOB at rest, cold or blue, red streaks, new or worsening SOB, fever  Protocols used: Leg Swelling and Edema-A-AH

## 2024-05-13 ENCOUNTER — Inpatient Hospital Stay: Payer: 59 | Admitting: Hematology and Oncology

## 2024-05-14 ENCOUNTER — Ambulatory Visit: Admitting: Podiatry
